# Patient Record
Sex: Male | Born: 1950 | Race: Black or African American | Hispanic: No | Marital: Single | Smoking: Never smoker
Health system: Southern US, Community
[De-identification: ages and names within clinical notes are randomized; demographics above are authoritative.]

## PROBLEM LIST (undated history)

## (undated) DIAGNOSIS — Z992 Dependence on renal dialysis: Secondary | ICD-10-CM

## (undated) DIAGNOSIS — Z789 Other specified health status: Secondary | ICD-10-CM

## (undated) DIAGNOSIS — Z5189 Encounter for other specified aftercare: Secondary | ICD-10-CM

## (undated) DIAGNOSIS — G4730 Sleep apnea, unspecified: Secondary | ICD-10-CM

## (undated) DIAGNOSIS — IMO0001 Reserved for inherently not codable concepts without codable children: Secondary | ICD-10-CM

## (undated) DIAGNOSIS — E785 Hyperlipidemia, unspecified: Secondary | ICD-10-CM

## (undated) DIAGNOSIS — J449 Chronic obstructive pulmonary disease, unspecified: Secondary | ICD-10-CM

## (undated) DIAGNOSIS — Z9119 Patient's noncompliance with other medical treatment and regimen: Secondary | ICD-10-CM

## (undated) DIAGNOSIS — C8590 Non-Hodgkin lymphoma, unspecified, unspecified site: Secondary | ICD-10-CM

## (undated) DIAGNOSIS — E1121 Type 2 diabetes mellitus with diabetic nephropathy: Secondary | ICD-10-CM

## (undated) DIAGNOSIS — N186 End stage renal disease: Secondary | ICD-10-CM

## (undated) DIAGNOSIS — I4891 Unspecified atrial fibrillation: Secondary | ICD-10-CM

## (undated) DIAGNOSIS — Z794 Long term (current) use of insulin: Secondary | ICD-10-CM

## (undated) DIAGNOSIS — C4490 Unspecified malignant neoplasm of skin, unspecified: Secondary | ICD-10-CM

## (undated) DIAGNOSIS — I472 Ventricular tachycardia: Secondary | ICD-10-CM

## (undated) DIAGNOSIS — D649 Anemia, unspecified: Secondary | ICD-10-CM

## (undated) DIAGNOSIS — I739 Peripheral vascular disease, unspecified: Secondary | ICD-10-CM

## (undated) DIAGNOSIS — I82409 Acute embolism and thrombosis of unspecified deep veins of unspecified lower extremity: Secondary | ICD-10-CM

## (undated) DIAGNOSIS — Z8489 Family history of other specified conditions: Secondary | ICD-10-CM

## (undated) DIAGNOSIS — I10 Essential (primary) hypertension: Secondary | ICD-10-CM

## (undated) DIAGNOSIS — K219 Gastro-esophageal reflux disease without esophagitis: Secondary | ICD-10-CM

## (undated) DIAGNOSIS — M1990 Unspecified osteoarthritis, unspecified site: Secondary | ICD-10-CM

## (undated) DIAGNOSIS — N184 Chronic kidney disease, stage 4 (severe): Secondary | ICD-10-CM

## (undated) DIAGNOSIS — J189 Pneumonia, unspecified organism: Secondary | ICD-10-CM

## (undated) DIAGNOSIS — C8293 Follicular lymphoma, unspecified, intra-abdominal lymph nodes: Secondary | ICD-10-CM

## (undated) DIAGNOSIS — A419 Sepsis, unspecified organism: Secondary | ICD-10-CM

## (undated) DIAGNOSIS — R51 Headache: Secondary | ICD-10-CM

## (undated) DIAGNOSIS — E119 Type 2 diabetes mellitus without complications: Secondary | ICD-10-CM

## (undated) HISTORY — PX: PORTACATH PLACEMENT: SHX2246

## (undated) HISTORY — PX: EYE SURGERY: SHX253

## (undated) HISTORY — PX: OTHER SURGICAL HISTORY: SHX169

## (undated) HISTORY — PX: PORT-A-CATH REMOVAL: SHX5289

## (undated) HISTORY — DX: Follicular lymphoma, unspecified, intra-abdominal lymph nodes: C82.93

## (undated) HISTORY — PX: CARDIAC CATHETERIZATION: SHX172

---

## 1998-05-25 ENCOUNTER — Emergency Department (HOSPITAL_COMMUNITY): Admission: EM | Admit: 1998-05-25 | Discharge: 1998-05-25 | Payer: Self-pay | Admitting: Emergency Medicine

## 2002-08-22 ENCOUNTER — Emergency Department (HOSPITAL_COMMUNITY): Admission: EM | Admit: 2002-08-22 | Discharge: 2002-08-22 | Payer: Self-pay | Admitting: Emergency Medicine

## 2002-08-22 ENCOUNTER — Encounter: Payer: Self-pay | Admitting: *Deleted

## 2004-07-01 ENCOUNTER — Emergency Department (HOSPITAL_COMMUNITY): Admission: EM | Admit: 2004-07-01 | Discharge: 2004-07-01 | Payer: Self-pay | Admitting: Emergency Medicine

## 2004-10-10 ENCOUNTER — Ambulatory Visit: Payer: Self-pay | Admitting: Internal Medicine

## 2004-11-10 ENCOUNTER — Encounter (INDEPENDENT_AMBULATORY_CARE_PROVIDER_SITE_OTHER): Payer: Self-pay | Admitting: *Deleted

## 2004-11-10 ENCOUNTER — Ambulatory Visit: Payer: Self-pay | Admitting: Internal Medicine

## 2006-08-27 ENCOUNTER — Encounter (HOSPITAL_BASED_OUTPATIENT_CLINIC_OR_DEPARTMENT_OTHER): Admission: RE | Admit: 2006-08-27 | Discharge: 2006-11-25 | Payer: Self-pay | Admitting: Surgery

## 2007-10-08 ENCOUNTER — Inpatient Hospital Stay (HOSPITAL_COMMUNITY): Admission: EM | Admit: 2007-10-08 | Discharge: 2007-10-14 | Payer: Self-pay | Admitting: Emergency Medicine

## 2007-10-12 ENCOUNTER — Encounter (INDEPENDENT_AMBULATORY_CARE_PROVIDER_SITE_OTHER): Payer: Self-pay | Admitting: Interventional Radiology

## 2007-10-20 ENCOUNTER — Encounter (HOSPITAL_COMMUNITY): Admission: RE | Admit: 2007-10-20 | Discharge: 2008-01-18 | Payer: Self-pay | Admitting: Internal Medicine

## 2007-10-27 ENCOUNTER — Observation Stay (HOSPITAL_COMMUNITY): Admission: AD | Admit: 2007-10-27 | Discharge: 2007-10-29 | Payer: Self-pay

## 2007-10-28 ENCOUNTER — Encounter (INDEPENDENT_AMBULATORY_CARE_PROVIDER_SITE_OTHER): Payer: Self-pay | Admitting: Surgery

## 2007-11-07 ENCOUNTER — Ambulatory Visit: Payer: Self-pay | Admitting: Oncology

## 2007-11-17 LAB — COMPREHENSIVE METABOLIC PANEL
ALT: 11 U/L (ref 0–53)
AST: 10 U/L (ref 0–37)
Albumin: 3.7 g/dL (ref 3.5–5.2)
Alkaline Phosphatase: 118 U/L — ABNORMAL HIGH (ref 39–117)
BUN: 24 mg/dL — ABNORMAL HIGH (ref 6–23)
CO2: 22 mEq/L (ref 19–32)
Calcium: 9.3 mg/dL (ref 8.4–10.5)
Chloride: 106 mEq/L (ref 96–112)
Creatinine, Ser: 1.79 mg/dL — ABNORMAL HIGH (ref 0.40–1.50)
Glucose, Bld: 282 mg/dL — ABNORMAL HIGH (ref 70–99)
Potassium: 4.7 mEq/L (ref 3.5–5.3)
Sodium: 139 mEq/L (ref 135–145)
Total Bilirubin: 0.2 mg/dL — ABNORMAL LOW (ref 0.3–1.2)
Total Protein: 7.1 g/dL (ref 6.0–8.3)

## 2007-11-17 LAB — PROTHROMBIN TIME
INR: 1 (ref 0.0–1.5)
Prothrombin Time: 13.8 seconds (ref 11.6–15.2)

## 2007-11-17 LAB — CBC WITH DIFFERENTIAL/PLATELET
BASO%: 0.5 % (ref 0.0–2.0)
Basophils Absolute: 0 10*3/uL (ref 0.0–0.1)
EOS%: 2.9 % (ref 0.0–7.0)
Eosinophils Absolute: 0.2 10*3/uL (ref 0.0–0.5)
HCT: 29.3 % — ABNORMAL LOW (ref 38.7–49.9)
HGB: 9.7 g/dL — ABNORMAL LOW (ref 13.0–17.1)
LYMPH%: 21.8 % (ref 14.0–48.0)
MCH: 25.5 pg — ABNORMAL LOW (ref 28.0–33.4)
MCHC: 33.2 g/dL (ref 32.0–35.9)
MCV: 76.7 fL — ABNORMAL LOW (ref 81.6–98.0)
MONO#: 0.4 10*3/uL (ref 0.1–0.9)
MONO%: 5.6 % (ref 0.0–13.0)
NEUT#: 4.7 10*3/uL (ref 1.5–6.5)
NEUT%: 69.2 % (ref 40.0–75.0)
Platelets: 385 10*3/uL (ref 145–400)
RBC: 3.82 10*6/uL — ABNORMAL LOW (ref 4.20–5.71)
RDW: 15.8 % — ABNORMAL HIGH (ref 11.2–14.6)
WBC: 6.7 10*3/uL (ref 4.0–10.0)
lymph#: 1.5 10*3/uL (ref 0.9–3.3)

## 2007-11-17 LAB — LACTATE DEHYDROGENASE: LDH: 220 U/L (ref 94–250)

## 2007-11-17 LAB — URIC ACID: Uric Acid, Serum: 7 mg/dL (ref 4.0–7.8)

## 2007-11-17 LAB — APTT: aPTT: 36 seconds (ref 24–37)

## 2007-11-21 ENCOUNTER — Ambulatory Visit (HOSPITAL_COMMUNITY): Admission: RE | Admit: 2007-11-21 | Discharge: 2007-11-21 | Payer: Self-pay | Admitting: Oncology

## 2007-11-23 ENCOUNTER — Ambulatory Visit (HOSPITAL_COMMUNITY): Admission: RE | Admit: 2007-11-23 | Discharge: 2007-11-23 | Payer: Self-pay | Admitting: Oncology

## 2007-11-24 ENCOUNTER — Ambulatory Visit (HOSPITAL_BASED_OUTPATIENT_CLINIC_OR_DEPARTMENT_OTHER): Admission: RE | Admit: 2007-11-24 | Discharge: 2007-11-24 | Payer: Self-pay | Admitting: Surgery

## 2007-11-28 ENCOUNTER — Encounter: Payer: Self-pay | Admitting: Oncology

## 2007-11-28 ENCOUNTER — Ambulatory Visit (HOSPITAL_COMMUNITY): Admission: RE | Admit: 2007-11-28 | Discharge: 2007-11-28 | Payer: Self-pay | Admitting: Oncology

## 2007-11-28 ENCOUNTER — Ambulatory Visit: Payer: Self-pay | Admitting: Oncology

## 2007-12-01 LAB — COMPREHENSIVE METABOLIC PANEL
ALT: 24 U/L (ref 0–53)
AST: 23 U/L (ref 0–37)
Albumin: 3.8 g/dL (ref 3.5–5.2)
Alkaline Phosphatase: 116 U/L (ref 39–117)
BUN: 27 mg/dL — ABNORMAL HIGH (ref 6–23)
CO2: 24 mEq/L (ref 19–32)
Calcium: 8.7 mg/dL (ref 8.4–10.5)
Chloride: 106 mEq/L (ref 96–112)
Creatinine, Ser: 1.65 mg/dL — ABNORMAL HIGH (ref 0.40–1.50)
Glucose, Bld: 97 mg/dL (ref 70–99)
Potassium: 4.7 mEq/L (ref 3.5–5.3)
Sodium: 140 mEq/L (ref 135–145)
Total Bilirubin: 0.3 mg/dL (ref 0.3–1.2)
Total Protein: 7.1 g/dL (ref 6.0–8.3)

## 2007-12-01 LAB — IRON AND TIBC
Iron: <10 ug/dL — ABNORMAL LOW (ref 42–165)
UIBC: 284 ug/dL

## 2007-12-01 LAB — CBC WITH DIFFERENTIAL/PLATELET
BASO%: 2 % (ref 0.0–2.0)
Basophils Absolute: 0.2 10*3/uL — ABNORMAL HIGH (ref 0.0–0.1)
EOS%: 1.6 % (ref 0.0–7.0)
Eosinophils Absolute: 0.1 10*3/uL (ref 0.0–0.5)
HCT: 25.8 % — ABNORMAL LOW (ref 38.7–49.9)
HGB: 8.5 g/dL — ABNORMAL LOW (ref 13.0–17.1)
LYMPH%: 15.2 % (ref 14.0–48.0)
MCH: 25.1 pg — ABNORMAL LOW (ref 28.0–33.4)
MCHC: 33.1 g/dL (ref 32.0–35.9)
MCV: 75.9 fL — ABNORMAL LOW (ref 81.6–98.0)
MONO#: 0.4 10*3/uL (ref 0.1–0.9)
MONO%: 5.1 % (ref 0.0–13.0)
NEUT#: 6.2 10*3/uL (ref 1.5–6.5)
NEUT%: 76.1 % — ABNORMAL HIGH (ref 40.0–75.0)
Platelets: 291 10*3/uL (ref 145–400)
RBC: 3.4 10*6/uL — ABNORMAL LOW (ref 4.20–5.71)
RDW: 17.1 % — ABNORMAL HIGH (ref 11.2–14.6)
WBC: 8.1 10*3/uL (ref 4.0–10.0)
lymph#: 1.2 10*3/uL (ref 0.9–3.3)

## 2007-12-01 LAB — LACTATE DEHYDROGENASE: LDH: 456 U/L — ABNORMAL HIGH (ref 94–250)

## 2007-12-01 LAB — FERRITIN: Ferritin: 96 ng/mL (ref 22–322)

## 2007-12-05 ENCOUNTER — Ambulatory Visit: Payer: Self-pay | Admitting: Oncology

## 2007-12-14 ENCOUNTER — Ambulatory Visit: Payer: Self-pay | Admitting: Oncology

## 2007-12-19 LAB — CBC WITH DIFFERENTIAL/PLATELET
BASO%: 0 % (ref 0.0–2.0)
Basophils Absolute: 0 10*3/uL (ref 0.0–0.1)
EOS%: 2.7 % (ref 0.0–7.0)
Eosinophils Absolute: 0.1 10*3/uL (ref 0.0–0.5)
HCT: 26.1 % — ABNORMAL LOW (ref 38.7–49.9)
HGB: 8.7 g/dL — ABNORMAL LOW (ref 13.0–17.1)
LYMPH%: 30.6 % (ref 14.0–48.0)
MCH: 25.2 pg — ABNORMAL LOW (ref 28.0–33.4)
MCHC: 33.3 g/dL (ref 32.0–35.9)
MCV: 75.9 fL — ABNORMAL LOW (ref 81.6–98.0)
MONO#: 0.4 10*3/uL (ref 0.1–0.9)
MONO%: 13.4 % — ABNORMAL HIGH (ref 0.0–13.0)
NEUT#: 1.4 10*3/uL — ABNORMAL LOW (ref 1.5–6.5)
NEUT%: 53.3 % (ref 40.0–75.0)
Platelets: 323 10*3/uL (ref 145–400)
RBC: 3.44 10*6/uL — ABNORMAL LOW (ref 4.20–5.71)
RDW: 17.8 % — ABNORMAL HIGH (ref 11.2–14.6)
WBC: 2.7 10*3/uL — ABNORMAL LOW (ref 4.0–10.0)
lymph#: 0.8 10*3/uL — ABNORMAL LOW (ref 0.9–3.3)

## 2007-12-22 LAB — COMPREHENSIVE METABOLIC PANEL
ALT: 20 U/L (ref 0–53)
AST: 16 U/L (ref 0–37)
Albumin: 3.8 g/dL (ref 3.5–5.2)
Alkaline Phosphatase: 95 U/L (ref 39–117)
BUN: 25 mg/dL — ABNORMAL HIGH (ref 6–23)
CO2: 23 mEq/L (ref 19–32)
Calcium: 9.3 mg/dL (ref 8.4–10.5)
Chloride: 102 mEq/L (ref 96–112)
Creatinine, Ser: 1.63 mg/dL — ABNORMAL HIGH (ref 0.40–1.50)
Glucose, Bld: 227 mg/dL — ABNORMAL HIGH (ref 70–99)
Potassium: 4.6 mEq/L (ref 3.5–5.3)
Sodium: 135 mEq/L (ref 135–145)
Total Bilirubin: 0.2 mg/dL — ABNORMAL LOW (ref 0.3–1.2)
Total Protein: 6.3 g/dL (ref 6.0–8.3)

## 2007-12-22 LAB — CBC WITH DIFFERENTIAL/PLATELET
BASO%: 4.8 % — ABNORMAL HIGH (ref 0.0–2.0)
Basophils Absolute: 0.1 10*3/uL (ref 0.0–0.1)
EOS%: 2.1 % (ref 0.0–7.0)
Eosinophils Absolute: 0.1 10*3/uL (ref 0.0–0.5)
HCT: 27.4 % — ABNORMAL LOW (ref 38.7–49.9)
HGB: 8.7 g/dL — ABNORMAL LOW (ref 13.0–17.1)
LYMPH%: 37.2 % (ref 14.0–48.0)
MCH: 24.7 pg — ABNORMAL LOW (ref 28.0–33.4)
MCHC: 31.8 g/dL — ABNORMAL LOW (ref 32.0–35.9)
MCV: 77.7 fL — ABNORMAL LOW (ref 81.6–98.0)
MONO#: 0.6 10*3/uL (ref 0.1–0.9)
MONO%: 19.3 % — ABNORMAL HIGH (ref 0.0–13.0)
NEUT#: 1.1 10*3/uL — ABNORMAL LOW (ref 1.5–6.5)
NEUT%: 36.7 % — ABNORMAL LOW (ref 40.0–75.0)
Platelets: 348 10*3/uL (ref 145–400)
RBC: 3.52 10*6/uL — ABNORMAL LOW (ref 4.20–5.71)
RDW: 18.2 % — ABNORMAL HIGH (ref 11.2–14.6)
WBC: 3 10*3/uL — ABNORMAL LOW (ref 4.0–10.0)
lymph#: 1.1 10*3/uL (ref 0.9–3.3)

## 2007-12-22 LAB — TECHNOLOGIST REVIEW

## 2007-12-22 LAB — LACTATE DEHYDROGENASE: LDH: 197 U/L (ref 94–250)

## 2008-01-13 LAB — CBC WITH DIFFERENTIAL/PLATELET
BASO%: 0.3 % (ref 0.0–2.0)
Basophils Absolute: 0 10*3/uL (ref 0.0–0.1)
EOS%: 0.6 % (ref 0.0–7.0)
Eosinophils Absolute: 0 10*3/uL (ref 0.0–0.5)
HCT: 25.2 % — ABNORMAL LOW (ref 38.7–49.9)
HGB: 8.4 g/dL — ABNORMAL LOW (ref 13.0–17.1)
LYMPH%: 13.7 % — ABNORMAL LOW (ref 14.0–48.0)
MCH: 26.2 pg — ABNORMAL LOW (ref 28.0–33.4)
MCHC: 33.2 g/dL (ref 32.0–35.9)
MCV: 78.9 fL — ABNORMAL LOW (ref 81.6–98.0)
MONO#: 0.5 10*3/uL (ref 0.1–0.9)
MONO%: 7.2 % (ref 0.0–13.0)
NEUT#: 5.9 10*3/uL (ref 1.5–6.5)
NEUT%: 78.2 % — ABNORMAL HIGH (ref 40.0–75.0)
Platelets: 310 10*3/uL (ref 145–400)
RBC: 3.19 10*6/uL — ABNORMAL LOW (ref 4.20–5.71)
RDW: 22.6 % — ABNORMAL HIGH (ref 11.2–14.6)
WBC: 7.5 10*3/uL (ref 4.0–10.0)
lymph#: 1 10*3/uL (ref 0.9–3.3)

## 2008-01-13 LAB — COMPREHENSIVE METABOLIC PANEL
ALT: 25 U/L (ref 0–53)
AST: 21 U/L (ref 0–37)
Albumin: 4 g/dL (ref 3.5–5.2)
Alkaline Phosphatase: 112 U/L (ref 39–117)
BUN: 25 mg/dL — ABNORMAL HIGH (ref 6–23)
CO2: 24 mEq/L (ref 19–32)
Calcium: 9 mg/dL (ref 8.4–10.5)
Chloride: 108 mEq/L (ref 96–112)
Creatinine, Ser: 1.81 mg/dL — ABNORMAL HIGH (ref 0.40–1.50)
Glucose, Bld: 165 mg/dL — ABNORMAL HIGH (ref 70–99)
Potassium: 4.4 mEq/L (ref 3.5–5.3)
Sodium: 143 mEq/L (ref 135–145)
Total Bilirubin: 0.2 mg/dL — ABNORMAL LOW (ref 0.3–1.2)
Total Protein: 6.6 g/dL (ref 6.0–8.3)

## 2008-01-13 LAB — LACTATE DEHYDROGENASE: LDH: 272 U/L — ABNORMAL HIGH (ref 94–250)

## 2008-01-16 LAB — CBC WITH DIFFERENTIAL/PLATELET
BASO%: 0.1 % (ref 0.0–2.0)
Basophils Absolute: 0 10*3/uL (ref 0.0–0.1)
EOS%: 0.8 % (ref 0.0–7.0)
Eosinophils Absolute: 0.1 10*3/uL (ref 0.0–0.5)
HCT: 25.8 % — ABNORMAL LOW (ref 38.7–49.9)
HGB: 8.5 g/dL — ABNORMAL LOW (ref 13.0–17.1)
LYMPH%: 8.8 % — ABNORMAL LOW (ref 14.0–48.0)
MCH: 26.1 pg — ABNORMAL LOW (ref 28.0–33.4)
MCHC: 32.8 g/dL (ref 32.0–35.9)
MCV: 79.5 fL — ABNORMAL LOW (ref 81.6–98.0)
MONO#: 0.4 10*3/uL (ref 0.1–0.9)
MONO%: 5.9 % (ref 0.0–13.0)
NEUT#: 6.2 10*3/uL (ref 1.5–6.5)
NEUT%: 84.4 % — ABNORMAL HIGH (ref 40.0–75.0)
Platelets: 360 10*3/uL (ref 145–400)
RBC: 3.25 10*6/uL — ABNORMAL LOW (ref 4.20–5.71)
RDW: 23.9 % — ABNORMAL HIGH (ref 11.2–14.6)
WBC: 7.4 10*3/uL (ref 4.0–10.0)
lymph#: 0.6 10*3/uL — ABNORMAL LOW (ref 0.9–3.3)

## 2008-01-18 ENCOUNTER — Encounter (HOSPITAL_COMMUNITY): Admission: RE | Admit: 2008-01-18 | Discharge: 2008-02-09 | Payer: Self-pay | Admitting: Oncology

## 2008-01-18 LAB — TYPE & CROSSMATCH - CHCC

## 2008-01-20 LAB — COMPREHENSIVE METABOLIC PANEL
ALT: 39 U/L (ref 0–53)
AST: 30 U/L (ref 0–37)
Albumin: 3.6 g/dL (ref 3.5–5.2)
Alkaline Phosphatase: 125 U/L — ABNORMAL HIGH (ref 39–117)
BUN: 24 mg/dL — ABNORMAL HIGH (ref 6–23)
CO2: 25 mEq/L (ref 19–32)
Calcium: 9.4 mg/dL (ref 8.4–10.5)
Chloride: 103 mEq/L (ref 96–112)
Creatinine, Ser: 1.46 mg/dL (ref 0.40–1.50)
Glucose, Bld: 315 mg/dL — ABNORMAL HIGH (ref 70–99)
Potassium: 4 mEq/L (ref 3.5–5.3)
Sodium: 136 mEq/L (ref 135–145)
Total Bilirubin: 0.9 mg/dL (ref 0.3–1.2)
Total Protein: 6.6 g/dL (ref 6.0–8.3)

## 2008-01-20 LAB — CBC WITH DIFFERENTIAL/PLATELET
BASO%: 0 % (ref 0.0–2.0)
Basophils Absolute: 0 10*3/uL (ref 0.0–0.1)
EOS%: 0 % (ref 0.0–7.0)
Eosinophils Absolute: 0 10*3/uL (ref 0.0–0.5)
HCT: 30.9 % — ABNORMAL LOW (ref 38.7–49.9)
HGB: 10.3 g/dL — ABNORMAL LOW (ref 13.0–17.1)
LYMPH%: 2.7 % — ABNORMAL LOW (ref 14.0–48.0)
MCH: 27.1 pg — ABNORMAL LOW (ref 28.0–33.4)
MCHC: 33.4 g/dL (ref 32.0–35.9)
MCV: 81 fL — ABNORMAL LOW (ref 81.6–98.0)
MONO#: 0.1 10*3/uL (ref 0.1–0.9)
MONO%: 0.4 % (ref 0.0–13.0)
NEUT#: 36 10*3/uL — ABNORMAL HIGH (ref 1.5–6.5)
NEUT%: 96.9 % — ABNORMAL HIGH (ref 40.0–75.0)
Platelets: 319 10*3/uL (ref 145–400)
RBC: 3.81 10*6/uL — ABNORMAL LOW (ref 4.20–5.71)
RDW: 22.4 % — ABNORMAL HIGH (ref 11.2–14.6)
WBC: 37.2 10*3/uL — ABNORMAL HIGH (ref 4.0–10.0)
lymph#: 1 10*3/uL (ref 0.9–3.3)

## 2008-01-20 LAB — LACTATE DEHYDROGENASE: LDH: 264 U/L — ABNORMAL HIGH (ref 94–250)

## 2008-01-27 LAB — CBC WITH DIFFERENTIAL/PLATELET
BASO%: 0.1 % (ref 0.0–2.0)
Basophils Absolute: 0 10*3/uL (ref 0.0–0.1)
EOS%: 0.7 % (ref 0.0–7.0)
Eosinophils Absolute: 0.1 10*3/uL (ref 0.0–0.5)
HCT: 28.3 % — ABNORMAL LOW (ref 38.7–49.9)
HGB: 9.4 g/dL — ABNORMAL LOW (ref 13.0–17.1)
LYMPH%: 9.8 % — ABNORMAL LOW (ref 14.0–48.0)
MCH: 26.9 pg — ABNORMAL LOW (ref 28.0–33.4)
MCHC: 33.1 g/dL (ref 32.0–35.9)
MCV: 81.2 fL — ABNORMAL LOW (ref 81.6–98.0)
MONO#: 0.5 10*3/uL (ref 0.1–0.9)
MONO%: 5.8 % (ref 0.0–13.0)
NEUT#: 7.6 10*3/uL — ABNORMAL HIGH (ref 1.5–6.5)
NEUT%: 83.6 % — ABNORMAL HIGH (ref 40.0–75.0)
Platelets: 176 10*3/uL (ref 145–400)
RBC: 3.48 10*6/uL — ABNORMAL LOW (ref 4.20–5.71)
RDW: 22.8 % — ABNORMAL HIGH (ref 11.2–14.6)
WBC: 9.1 10*3/uL (ref 4.0–10.0)
lymph#: 0.9 10*3/uL (ref 0.9–3.3)

## 2008-01-27 LAB — COMPREHENSIVE METABOLIC PANEL
ALT: 16 U/L (ref 0–53)
AST: 12 U/L (ref 0–37)
Albumin: 3.8 g/dL (ref 3.5–5.2)
Alkaline Phosphatase: 141 U/L — ABNORMAL HIGH (ref 39–117)
BUN: 22 mg/dL (ref 6–23)
CO2: 22 mEq/L (ref 19–32)
Calcium: 8.9 mg/dL (ref 8.4–10.5)
Chloride: 107 mEq/L (ref 96–112)
Creatinine, Ser: 1.63 mg/dL — ABNORMAL HIGH (ref 0.40–1.50)
Glucose, Bld: 306 mg/dL — ABNORMAL HIGH (ref 70–99)
Potassium: 3.9 mEq/L (ref 3.5–5.3)
Sodium: 140 mEq/L (ref 135–145)
Total Bilirubin: 0.2 mg/dL — ABNORMAL LOW (ref 0.3–1.2)
Total Protein: 6.6 g/dL (ref 6.0–8.3)

## 2008-01-27 LAB — LACTATE DEHYDROGENASE: LDH: 230 U/L (ref 94–250)

## 2008-01-30 ENCOUNTER — Ambulatory Visit (HOSPITAL_COMMUNITY): Admission: RE | Admit: 2008-01-30 | Discharge: 2008-01-30 | Payer: Self-pay | Admitting: Oncology

## 2008-02-01 ENCOUNTER — Ambulatory Visit: Payer: Self-pay | Admitting: Oncology

## 2008-02-03 LAB — CBC WITH DIFFERENTIAL/PLATELET
BASO%: 0.1 % (ref 0.0–2.0)
Basophils Absolute: 0 10*3/uL (ref 0.0–0.1)
EOS%: 1 % (ref 0.0–7.0)
Eosinophils Absolute: 0.1 10*3/uL (ref 0.0–0.5)
HCT: 30.8 % — ABNORMAL LOW (ref 38.7–49.9)
HGB: 10.5 g/dL — ABNORMAL LOW (ref 13.0–17.1)
LYMPH%: 13.2 % — ABNORMAL LOW (ref 14.0–48.0)
MCH: 27.7 pg — ABNORMAL LOW (ref 28.0–33.4)
MCHC: 34 g/dL (ref 32.0–35.9)
MCV: 81.4 fL — ABNORMAL LOW (ref 81.6–98.0)
MONO#: 0.8 10*3/uL (ref 0.1–0.9)
MONO%: 8.8 % (ref 0.0–13.0)
NEUT#: 6.7 10*3/uL — ABNORMAL HIGH (ref 1.5–6.5)
NEUT%: 76.9 % — ABNORMAL HIGH (ref 40.0–75.0)
Platelets: 282 10*3/uL (ref 145–400)
RBC: 3.79 10*6/uL — ABNORMAL LOW (ref 4.20–5.71)
RDW: 23 % — ABNORMAL HIGH (ref 11.2–14.6)
WBC: 8.6 10*3/uL (ref 4.0–10.0)
lymph#: 1.1 10*3/uL (ref 0.9–3.3)

## 2008-02-03 LAB — COMPREHENSIVE METABOLIC PANEL
ALT: 23 U/L (ref 0–53)
AST: 18 U/L (ref 0–37)
Albumin: 4.1 g/dL (ref 3.5–5.2)
Alkaline Phosphatase: 120 U/L — ABNORMAL HIGH (ref 39–117)
BUN: 32 mg/dL — ABNORMAL HIGH (ref 6–23)
CO2: 26 mEq/L (ref 19–32)
Calcium: 9.9 mg/dL (ref 8.4–10.5)
Chloride: 106 mEq/L (ref 96–112)
Creatinine, Ser: 1.96 mg/dL — ABNORMAL HIGH (ref 0.40–1.50)
Glucose, Bld: 111 mg/dL — ABNORMAL HIGH (ref 70–99)
Potassium: 4.7 mEq/L (ref 3.5–5.3)
Sodium: 141 mEq/L (ref 135–145)
Total Bilirubin: 0.3 mg/dL (ref 0.3–1.2)
Total Protein: 6.9 g/dL (ref 6.0–8.3)

## 2008-02-03 LAB — LACTATE DEHYDROGENASE: LDH: 259 U/L — ABNORMAL HIGH (ref 94–250)

## 2008-02-10 ENCOUNTER — Inpatient Hospital Stay (HOSPITAL_COMMUNITY): Admission: AD | Admit: 2008-02-10 | Discharge: 2008-02-13 | Payer: Self-pay | Admitting: Oncology

## 2008-02-10 LAB — COMPREHENSIVE METABOLIC PANEL
ALT: 30 U/L (ref 0–53)
AST: 25 U/L (ref 0–37)
Albumin: 4.1 g/dL (ref 3.5–5.2)
Alkaline Phosphatase: 138 U/L — ABNORMAL HIGH (ref 39–117)
BUN: 42 mg/dL — ABNORMAL HIGH (ref 6–23)
CO2: 23 mEq/L (ref 19–32)
Calcium: 9.9 mg/dL (ref 8.4–10.5)
Chloride: 100 mEq/L (ref 96–112)
Creatinine, Ser: 1.93 mg/dL — ABNORMAL HIGH (ref 0.40–1.50)
Glucose, Bld: 413 mg/dL — ABNORMAL HIGH (ref 70–99)
Potassium: 5.2 mEq/L (ref 3.5–5.3)
Sodium: 134 mEq/L — ABNORMAL LOW (ref 135–145)
Total Bilirubin: 0.9 mg/dL (ref 0.3–1.2)
Total Protein: 7.1 g/dL (ref 6.0–8.3)

## 2008-02-10 LAB — CBC WITH DIFFERENTIAL/PLATELET
BASO%: 0 % (ref 0.0–2.0)
Basophils Absolute: 0 10*3/uL (ref 0.0–0.1)
EOS%: 0 % (ref 0.0–7.0)
Eosinophils Absolute: 0 10*3/uL (ref 0.0–0.5)
HCT: 32 % — ABNORMAL LOW (ref 38.7–49.9)
HGB: 10.6 g/dL — ABNORMAL LOW (ref 13.0–17.1)
LYMPH%: 1.9 % — ABNORMAL LOW (ref 14.0–48.0)
MCH: 27.3 pg — ABNORMAL LOW (ref 28.0–33.4)
MCHC: 33.3 g/dL (ref 32.0–35.9)
MCV: 82.1 fL (ref 81.6–98.0)
MONO#: 0.3 10*3/uL (ref 0.1–0.9)
MONO%: 0.7 % (ref 0.0–13.0)
NEUT#: 47.7 10*3/uL — ABNORMAL HIGH (ref 1.5–6.5)
NEUT%: 97.4 % — ABNORMAL HIGH (ref 40.0–75.0)
Platelets: 271 10*3/uL (ref 145–400)
RBC: 3.89 10*6/uL — ABNORMAL LOW (ref 4.20–5.71)
RDW: 23.1 % — ABNORMAL HIGH (ref 11.2–14.6)
WBC: 49 10*3/uL — ABNORMAL HIGH (ref 4.0–10.0)
lymph#: 0.9 10*3/uL (ref 0.9–3.3)

## 2008-02-10 LAB — LACTATE DEHYDROGENASE: LDH: 279 U/L — ABNORMAL HIGH (ref 94–250)

## 2008-02-11 ENCOUNTER — Ambulatory Visit: Payer: Self-pay | Admitting: Oncology

## 2008-02-17 LAB — COMPREHENSIVE METABOLIC PANEL
ALT: 20 U/L (ref 0–53)
AST: 13 U/L (ref 0–37)
Albumin: 3.8 g/dL (ref 3.5–5.2)
Alkaline Phosphatase: 103 U/L (ref 39–117)
BUN: 21 mg/dL (ref 6–23)
CO2: 23 mEq/L (ref 19–32)
Calcium: 9 mg/dL (ref 8.4–10.5)
Chloride: 104 mEq/L (ref 96–112)
Creatinine, Ser: 1.51 mg/dL — ABNORMAL HIGH (ref 0.40–1.50)
Glucose, Bld: 201 mg/dL — ABNORMAL HIGH (ref 70–99)
Potassium: 4.2 mEq/L (ref 3.5–5.3)
Sodium: 137 mEq/L (ref 135–145)
Total Bilirubin: 0.2 mg/dL — ABNORMAL LOW (ref 0.3–1.2)
Total Protein: 6.5 g/dL (ref 6.0–8.3)

## 2008-02-17 LAB — CBC WITH DIFFERENTIAL/PLATELET
BASO%: 0 % (ref 0.0–2.0)
Basophils Absolute: 0 10*3/uL (ref 0.0–0.1)
EOS%: 0.6 % (ref 0.0–7.0)
Eosinophils Absolute: 0 10*3/uL (ref 0.0–0.5)
HCT: 27.5 % — ABNORMAL LOW (ref 38.7–49.9)
HGB: 9.2 g/dL — ABNORMAL LOW (ref 13.0–17.1)
LYMPH%: 11.1 % — ABNORMAL LOW (ref 14.0–48.0)
MCH: 27.5 pg — ABNORMAL LOW (ref 28.0–33.4)
MCHC: 33.6 g/dL (ref 32.0–35.9)
MCV: 82 fL (ref 81.6–98.0)
MONO#: 0.2 10*3/uL (ref 0.1–0.9)
MONO%: 4.1 % (ref 0.0–13.0)
NEUT#: 5 10*3/uL (ref 1.5–6.5)
NEUT%: 84.2 % — ABNORMAL HIGH (ref 40.0–75.0)
Platelets: 182 10*3/uL (ref 145–400)
RBC: 3.36 10*6/uL — ABNORMAL LOW (ref 4.20–5.71)
RDW: 22.5 % — ABNORMAL HIGH (ref 11.2–14.6)
WBC: 6 10*3/uL (ref 4.0–10.0)
lymph#: 0.7 10*3/uL — ABNORMAL LOW (ref 0.9–3.3)

## 2008-02-17 LAB — LACTATE DEHYDROGENASE: LDH: 191 U/L (ref 94–250)

## 2008-02-24 ENCOUNTER — Ambulatory Visit: Admission: RE | Admit: 2008-02-24 | Discharge: 2008-02-24 | Payer: Self-pay | Admitting: Oncology

## 2008-02-24 ENCOUNTER — Encounter: Payer: Self-pay | Admitting: Oncology

## 2008-02-24 ENCOUNTER — Ambulatory Visit: Payer: Self-pay | Admitting: Surgery

## 2008-02-24 LAB — CBC WITH DIFFERENTIAL/PLATELET
BASO%: 0.1 % (ref 0.0–2.0)
Basophils Absolute: 0 10*3/uL (ref 0.0–0.1)
EOS%: 0.3 % (ref 0.0–7.0)
Eosinophils Absolute: 0 10*3/uL (ref 0.0–0.5)
HCT: 28 % — ABNORMAL LOW (ref 38.7–49.9)
HGB: 9.3 g/dL — ABNORMAL LOW (ref 13.0–17.1)
LYMPH%: 7.9 % — ABNORMAL LOW (ref 14.0–48.0)
MCH: 27.4 pg — ABNORMAL LOW (ref 28.0–33.4)
MCHC: 33.4 g/dL (ref 32.0–35.9)
MCV: 82.1 fL (ref 81.6–98.0)
MONO#: 0.5 10*3/uL (ref 0.1–0.9)
MONO%: 6.3 % (ref 0.0–13.0)
NEUT#: 7.2 10*3/uL — ABNORMAL HIGH (ref 1.5–6.5)
NEUT%: 85.4 % — ABNORMAL HIGH (ref 40.0–75.0)
Platelets: 226 10*3/uL (ref 145–400)
RBC: 3.4 10*6/uL — ABNORMAL LOW (ref 4.20–5.71)
RDW: 22.8 % — ABNORMAL HIGH (ref 11.2–14.6)
WBC: 8.5 10*3/uL (ref 4.0–10.0)
lymph#: 0.7 10*3/uL — ABNORMAL LOW (ref 0.9–3.3)

## 2008-02-24 LAB — COMPREHENSIVE METABOLIC PANEL
ALT: 19 U/L (ref 0–53)
AST: 17 U/L (ref 0–37)
Albumin: 4 g/dL (ref 3.5–5.2)
Alkaline Phosphatase: 106 U/L (ref 39–117)
BUN: 17 mg/dL (ref 6–23)
CO2: 22 mEq/L (ref 19–32)
Calcium: 9.4 mg/dL (ref 8.4–10.5)
Chloride: 105 mEq/L (ref 96–112)
Creatinine, Ser: 1.4 mg/dL (ref 0.40–1.50)
Glucose, Bld: 132 mg/dL — ABNORMAL HIGH (ref 70–99)
Potassium: 4.1 mEq/L (ref 3.5–5.3)
Sodium: 139 mEq/L (ref 135–145)
Total Bilirubin: 0.2 mg/dL — ABNORMAL LOW (ref 0.3–1.2)
Total Protein: 6.6 g/dL (ref 6.0–8.3)

## 2008-02-24 LAB — LACTATE DEHYDROGENASE: LDH: 238 U/L (ref 94–250)

## 2008-03-02 LAB — CBC WITH DIFFERENTIAL/PLATELET
BASO%: 0.1 % (ref 0.0–2.0)
Basophils Absolute: 0 10*3/uL (ref 0.0–0.1)
EOS%: 0.2 % (ref 0.0–7.0)
Eosinophils Absolute: 0 10*3/uL (ref 0.0–0.5)
HCT: 26.9 % — ABNORMAL LOW (ref 38.7–49.9)
HGB: 9 g/dL — ABNORMAL LOW (ref 13.0–17.1)
LYMPH%: 6.7 % — ABNORMAL LOW (ref 14.0–48.0)
MCH: 27.7 pg — ABNORMAL LOW (ref 28.0–33.4)
MCHC: 33.5 g/dL (ref 32.0–35.9)
MCV: 82.6 fL (ref 81.6–98.0)
MONO#: 0.1 10*3/uL (ref 0.1–0.9)
MONO%: 1.2 % (ref 0.0–13.0)
NEUT#: 4.9 10*3/uL (ref 1.5–6.5)
NEUT%: 91.8 % — ABNORMAL HIGH (ref 40.0–75.0)
Platelets: 257 10*3/uL (ref 145–400)
RBC: 3.26 10*6/uL — ABNORMAL LOW (ref 4.20–5.71)
RDW: 21.7 % — ABNORMAL HIGH (ref 11.2–14.6)
WBC: 5.3 10*3/uL (ref 4.0–10.0)
lymph#: 0.4 10*3/uL — ABNORMAL LOW (ref 0.9–3.3)

## 2008-03-02 LAB — COMPREHENSIVE METABOLIC PANEL
ALT: 22 U/L (ref 0–53)
AST: 15 U/L (ref 0–37)
Albumin: 4 g/dL (ref 3.5–5.2)
Alkaline Phosphatase: 91 U/L (ref 39–117)
BUN: 37 mg/dL — ABNORMAL HIGH (ref 6–23)
CO2: 22 mEq/L (ref 19–32)
Calcium: 10 mg/dL (ref 8.4–10.5)
Chloride: 107 mEq/L (ref 96–112)
Creatinine, Ser: 1.75 mg/dL — ABNORMAL HIGH (ref 0.40–1.50)
Glucose, Bld: 100 mg/dL — ABNORMAL HIGH (ref 70–99)
Potassium: 4 mEq/L (ref 3.5–5.3)
Sodium: 139 mEq/L (ref 135–145)
Total Bilirubin: 0.3 mg/dL (ref 0.3–1.2)
Total Protein: 6.7 g/dL (ref 6.0–8.3)

## 2008-03-02 LAB — LACTATE DEHYDROGENASE: LDH: 184 U/L (ref 94–250)

## 2008-03-09 LAB — CBC WITH DIFFERENTIAL/PLATELET
BASO%: 0.2 % (ref 0.0–2.0)
Basophils Absolute: 0 10*3/uL (ref 0.0–0.1)
EOS%: 1.2 % (ref 0.0–7.0)
Eosinophils Absolute: 0 10*3/uL (ref 0.0–0.5)
HCT: 26 % — ABNORMAL LOW (ref 38.7–49.9)
HGB: 8.7 g/dL — ABNORMAL LOW (ref 13.0–17.1)
LYMPH%: 16.7 % (ref 14.0–48.0)
MCH: 27.8 pg — ABNORMAL LOW (ref 28.0–33.4)
MCHC: 33.5 g/dL (ref 32.0–35.9)
MCV: 82.9 fL (ref 81.6–98.0)
MONO#: 0.2 10*3/uL (ref 0.1–0.9)
MONO%: 10.7 % (ref 0.0–13.0)
NEUT#: 1.6 10*3/uL (ref 1.5–6.5)
NEUT%: 71.2 % (ref 40.0–75.0)
Platelets: 221 10*3/uL (ref 145–400)
RBC: 3.13 10*6/uL — ABNORMAL LOW (ref 4.20–5.71)
RDW: 21 % — ABNORMAL HIGH (ref 11.2–14.6)
WBC: 2.2 10*3/uL — ABNORMAL LOW (ref 4.0–10.0)
lymph#: 0.4 10*3/uL — ABNORMAL LOW (ref 0.9–3.3)

## 2008-03-09 LAB — COMPREHENSIVE METABOLIC PANEL
ALT: 16 U/L (ref 0–53)
AST: 20 U/L (ref 0–37)
Albumin: 3.9 g/dL (ref 3.5–5.2)
Alkaline Phosphatase: 89 U/L (ref 39–117)
BUN: 25 mg/dL — ABNORMAL HIGH (ref 6–23)
CO2: 22 mEq/L (ref 19–32)
Calcium: 9.2 mg/dL (ref 8.4–10.5)
Chloride: 104 mEq/L (ref 96–112)
Creatinine, Ser: 2.15 mg/dL — ABNORMAL HIGH (ref 0.40–1.50)
Glucose, Bld: 119 mg/dL — ABNORMAL HIGH (ref 70–99)
Potassium: 5 mEq/L (ref 3.5–5.3)
Sodium: 139 mEq/L (ref 135–145)
Total Bilirubin: 0.3 mg/dL (ref 0.3–1.2)
Total Protein: 6.5 g/dL (ref 6.0–8.3)

## 2008-03-09 LAB — LACTATE DEHYDROGENASE: LDH: 209 U/L (ref 94–250)

## 2008-03-16 ENCOUNTER — Ambulatory Visit: Payer: Self-pay | Admitting: Oncology

## 2008-03-16 LAB — COMPREHENSIVE METABOLIC PANEL WITH GFR
ALT: 14 U/L (ref 0–53)
AST: 13 U/L (ref 0–37)
Albumin: 4.2 g/dL (ref 3.5–5.2)
Alkaline Phosphatase: 95 U/L (ref 39–117)
BUN: 26 mg/dL — ABNORMAL HIGH (ref 6–23)
CO2: 24 meq/L (ref 19–32)
Calcium: 9.9 mg/dL (ref 8.4–10.5)
Chloride: 108 meq/L (ref 96–112)
Creatinine, Ser: 1.69 mg/dL — ABNORMAL HIGH (ref 0.40–1.50)
Glucose, Bld: 59 mg/dL — ABNORMAL LOW (ref 70–99)
Potassium: 4 meq/L (ref 3.5–5.3)
Sodium: 143 meq/L (ref 135–145)
Total Bilirubin: 0.3 mg/dL (ref 0.3–1.2)
Total Protein: 7.1 g/dL (ref 6.0–8.3)

## 2008-03-16 LAB — CBC WITH DIFFERENTIAL/PLATELET
BASO%: 2 % (ref 0.0–2.0)
Basophils Absolute: 0.1 10*3/uL (ref 0.0–0.1)
EOS%: 0.7 % (ref 0.0–7.0)
Eosinophils Absolute: 0 10*3/uL (ref 0.0–0.5)
HCT: 29.8 % — ABNORMAL LOW (ref 38.7–49.9)
HGB: 10 g/dL — ABNORMAL LOW (ref 13.0–17.1)
LYMPH%: 20.8 % (ref 14.0–48.0)
MCH: 27.8 pg — ABNORMAL LOW (ref 28.0–33.4)
MCHC: 33.6 g/dL (ref 32.0–35.9)
MCV: 82.8 fL (ref 81.6–98.0)
MONO#: 0.8 10*3/uL (ref 0.1–0.9)
MONO%: 21.6 % — ABNORMAL HIGH (ref 0.0–13.0)
NEUT#: 2 10*3/uL (ref 1.5–6.5)
NEUT%: 54.9 % (ref 40.0–75.0)
Platelets: 345 10*3/uL (ref 145–400)
RBC: 3.6 10*6/uL — ABNORMAL LOW (ref 4.20–5.71)
RDW: 18.4 % — ABNORMAL HIGH (ref 11.2–14.6)
WBC: 3.7 10*3/uL — ABNORMAL LOW (ref 4.0–10.0)
lymph#: 0.8 10*3/uL — ABNORMAL LOW (ref 0.9–3.3)

## 2008-03-16 LAB — LACTATE DEHYDROGENASE: LDH: 203 U/L (ref 94–250)

## 2008-03-23 LAB — CBC WITH DIFFERENTIAL/PLATELET
BASO%: 0 % (ref 0.0–2.0)
Basophils Absolute: 0 10*3/uL (ref 0.0–0.1)
EOS%: 0.1 % (ref 0.0–7.0)
Eosinophils Absolute: 0 10*3/uL (ref 0.0–0.5)
HCT: 29.9 % — ABNORMAL LOW (ref 38.7–49.9)
HGB: 10 g/dL — ABNORMAL LOW (ref 13.0–17.1)
LYMPH%: 8.7 % — ABNORMAL LOW (ref 14.0–48.0)
MCH: 27.4 pg — ABNORMAL LOW (ref 28.0–33.4)
MCHC: 33.4 g/dL (ref 32.0–35.9)
MCV: 82.3 fL (ref 81.6–98.0)
MONO#: 0.1 10*3/uL (ref 0.1–0.9)
MONO%: 1.8 % (ref 0.0–13.0)
NEUT#: 3.5 10*3/uL (ref 1.5–6.5)
NEUT%: 89.4 % — ABNORMAL HIGH (ref 40.0–75.0)
Platelets: 286 10*3/uL (ref 145–400)
RBC: 3.63 10*6/uL — ABNORMAL LOW (ref 4.20–5.71)
RDW: 17.2 % — ABNORMAL HIGH (ref 11.2–14.6)
WBC: 4 10*3/uL (ref 4.0–10.0)
lymph#: 0.3 10*3/uL — ABNORMAL LOW (ref 0.9–3.3)

## 2008-03-24 ENCOUNTER — Ambulatory Visit: Payer: Self-pay | Admitting: Oncology

## 2008-03-24 ENCOUNTER — Inpatient Hospital Stay (HOSPITAL_COMMUNITY): Admission: AD | Admit: 2008-03-24 | Discharge: 2008-03-25 | Payer: Self-pay | Admitting: Internal Medicine

## 2008-03-27 LAB — CBC WITH DIFFERENTIAL/PLATELET
BASO%: 0.6 % (ref 0.0–2.0)
Basophils Absolute: 0 10*3/uL (ref 0.0–0.1)
EOS%: 1.6 % (ref 0.0–7.0)
Eosinophils Absolute: 0 10*3/uL (ref 0.0–0.5)
HCT: 25.7 % — ABNORMAL LOW (ref 38.7–49.9)
HGB: 8.5 g/dL — ABNORMAL LOW (ref 13.0–17.1)
LYMPH%: 16.9 % (ref 14.0–48.0)
MCH: 26.8 pg — ABNORMAL LOW (ref 28.0–33.4)
MCHC: 33 g/dL (ref 32.0–35.9)
MCV: 81.4 fL — ABNORMAL LOW (ref 81.6–98.0)
MONO#: 0 10*3/uL — ABNORMAL LOW (ref 0.1–0.9)
MONO%: 1.6 % (ref 0.0–13.0)
NEUT#: 2.1 10*3/uL (ref 1.5–6.5)
NEUT%: 79.3 % — ABNORMAL HIGH (ref 40.0–75.0)
Platelets: 176 10*3/uL (ref 145–400)
RBC: 3.16 10*6/uL — ABNORMAL LOW (ref 4.20–5.71)
RDW: 15.4 % — ABNORMAL HIGH (ref 11.2–14.6)
WBC: 2.7 10*3/uL — ABNORMAL LOW (ref 4.0–10.0)
lymph#: 0.4 10*3/uL — ABNORMAL LOW (ref 0.9–3.3)

## 2008-03-27 LAB — COMPREHENSIVE METABOLIC PANEL
ALT: 63 U/L — ABNORMAL HIGH (ref 0–53)
AST: 32 U/L (ref 0–37)
Albumin: 3.8 g/dL (ref 3.5–5.2)
Alkaline Phosphatase: 94 U/L (ref 39–117)
BUN: 19 mg/dL (ref 6–23)
CO2: 24 mEq/L (ref 19–32)
Calcium: 9 mg/dL (ref 8.4–10.5)
Chloride: 106 mEq/L (ref 96–112)
Creatinine, Ser: 1.87 mg/dL — ABNORMAL HIGH (ref 0.40–1.50)
Glucose, Bld: 243 mg/dL — ABNORMAL HIGH (ref 70–99)
Potassium: 4 mEq/L (ref 3.5–5.3)
Sodium: 137 mEq/L (ref 135–145)
Total Bilirubin: 0.4 mg/dL (ref 0.3–1.2)
Total Protein: 6.3 g/dL (ref 6.0–8.3)

## 2008-03-27 LAB — LACTATE DEHYDROGENASE: LDH: 201 U/L (ref 94–250)

## 2008-04-16 ENCOUNTER — Ambulatory Visit (HOSPITAL_COMMUNITY): Admission: RE | Admit: 2008-04-16 | Discharge: 2008-04-16 | Payer: Self-pay | Admitting: Oncology

## 2008-04-16 LAB — CBC WITH DIFFERENTIAL/PLATELET
BASO%: 0 % (ref 0.0–2.0)
Basophils Absolute: 0 10*3/uL (ref 0.0–0.1)
EOS%: 1.1 % (ref 0.0–7.0)
Eosinophils Absolute: 0.1 10*3/uL (ref 0.0–0.5)
HCT: 27.9 % — ABNORMAL LOW (ref 38.7–49.9)
HGB: 9.2 g/dL — ABNORMAL LOW (ref 13.0–17.1)
LYMPH%: 13.7 % — ABNORMAL LOW (ref 14.0–48.0)
MCH: 27.4 pg — ABNORMAL LOW (ref 28.0–33.4)
MCHC: 32.9 g/dL (ref 32.0–35.9)
MCV: 83.3 fL (ref 81.6–98.0)
MONO#: 0.5 10*3/uL (ref 0.1–0.9)
MONO%: 9.3 % (ref 0.0–13.0)
NEUT#: 3.7 10*3/uL (ref 1.5–6.5)
NEUT%: 75.9 % — ABNORMAL HIGH (ref 40.0–75.0)
Platelets: 210 10*3/uL (ref 145–400)
RBC: 3.35 10*6/uL — ABNORMAL LOW (ref 4.20–5.71)
RDW: 18.1 % — ABNORMAL HIGH (ref 11.2–14.6)
WBC: 4.9 10*3/uL (ref 4.0–10.0)
lymph#: 0.7 10*3/uL — ABNORMAL LOW (ref 0.9–3.3)

## 2008-04-16 LAB — COMPREHENSIVE METABOLIC PANEL
ALT: 27 U/L (ref 0–53)
AST: 26 U/L (ref 0–37)
Albumin: 4.2 g/dL (ref 3.5–5.2)
Alkaline Phosphatase: 94 U/L (ref 39–117)
BUN: 26 mg/dL — ABNORMAL HIGH (ref 6–23)
CO2: 22 mEq/L (ref 19–32)
Calcium: 9.4 mg/dL (ref 8.4–10.5)
Chloride: 106 mEq/L (ref 96–112)
Creatinine, Ser: 1.46 mg/dL (ref 0.40–1.50)
Glucose, Bld: 240 mg/dL — ABNORMAL HIGH (ref 70–99)
Potassium: 4.1 mEq/L (ref 3.5–5.3)
Sodium: 139 mEq/L (ref 135–145)
Total Bilirubin: 0.4 mg/dL (ref 0.3–1.2)
Total Protein: 6.9 g/dL (ref 6.0–8.3)

## 2008-04-16 LAB — LACTATE DEHYDROGENASE: LDH: 293 U/L — ABNORMAL HIGH (ref 94–250)

## 2008-05-04 ENCOUNTER — Encounter: Admission: RE | Admit: 2008-05-04 | Discharge: 2008-05-04 | Payer: Self-pay | Admitting: Internal Medicine

## 2008-05-22 ENCOUNTER — Ambulatory Visit: Payer: Self-pay | Admitting: Internal Medicine

## 2008-05-28 ENCOUNTER — Ambulatory Visit: Payer: Self-pay | Admitting: Oncology

## 2008-05-29 ENCOUNTER — Telehealth (INDEPENDENT_AMBULATORY_CARE_PROVIDER_SITE_OTHER): Payer: Self-pay | Admitting: *Deleted

## 2008-06-14 ENCOUNTER — Ambulatory Visit: Payer: Self-pay | Admitting: Internal Medicine

## 2008-07-12 ENCOUNTER — Ambulatory Visit: Payer: Self-pay | Admitting: Oncology

## 2008-07-16 ENCOUNTER — Ambulatory Visit (HOSPITAL_COMMUNITY): Admission: RE | Admit: 2008-07-16 | Discharge: 2008-07-16 | Payer: Self-pay | Admitting: Oncology

## 2008-07-16 LAB — COMPREHENSIVE METABOLIC PANEL
ALT: 17 U/L (ref 0–53)
AST: 16 U/L (ref 0–37)
Albumin: 4.1 g/dL (ref 3.5–5.2)
Alkaline Phosphatase: 91 U/L (ref 39–117)
BUN: 24 mg/dL — ABNORMAL HIGH (ref 6–23)
CO2: 21 mEq/L (ref 19–32)
Calcium: 9.4 mg/dL (ref 8.4–10.5)
Chloride: 107 mEq/L (ref 96–112)
Creatinine, Ser: 1.5 mg/dL (ref 0.40–1.50)
Glucose, Bld: 199 mg/dL — ABNORMAL HIGH (ref 70–99)
Potassium: 4.1 mEq/L (ref 3.5–5.3)
Sodium: 140 mEq/L (ref 135–145)
Total Bilirubin: 0.3 mg/dL (ref 0.3–1.2)
Total Protein: 6.7 g/dL (ref 6.0–8.3)

## 2008-07-16 LAB — CBC WITH DIFFERENTIAL/PLATELET
BASO%: 0.1 % (ref 0.0–2.0)
Basophils Absolute: 0 10*3/uL (ref 0.0–0.1)
EOS%: 2.1 % (ref 0.0–7.0)
Eosinophils Absolute: 0.1 10*3/uL (ref 0.0–0.5)
HCT: 30.4 % — ABNORMAL LOW (ref 38.7–49.9)
HGB: 10.2 g/dL — ABNORMAL LOW (ref 13.0–17.1)
LYMPH%: 22.3 % (ref 14.0–48.0)
MCH: 27.3 pg — ABNORMAL LOW (ref 28.0–33.4)
MCHC: 33.5 g/dL (ref 32.0–35.9)
MCV: 81.6 fL (ref 81.6–98.0)
MONO#: 0.1 10*3/uL (ref 0.1–0.9)
MONO%: 1.3 % (ref 0.0–13.0)
NEUT#: 3.2 10*3/uL (ref 1.5–6.5)
NEUT%: 74.2 % (ref 40.0–75.0)
Platelets: 222 10*3/uL (ref 145–400)
RBC: 3.72 10*6/uL — ABNORMAL LOW (ref 4.20–5.71)
RDW: 16.5 % — ABNORMAL HIGH (ref 11.2–14.6)
WBC: 4.3 10*3/uL (ref 4.0–10.0)
lymph#: 1 10*3/uL (ref 0.9–3.3)

## 2008-07-16 LAB — LACTATE DEHYDROGENASE: LDH: 176 U/L (ref 94–250)

## 2008-08-24 ENCOUNTER — Ambulatory Visit: Payer: Self-pay | Admitting: Oncology

## 2008-09-20 ENCOUNTER — Encounter: Admission: RE | Admit: 2008-09-20 | Discharge: 2008-09-20 | Payer: Self-pay | Admitting: Internal Medicine

## 2008-09-20 ENCOUNTER — Encounter (INDEPENDENT_AMBULATORY_CARE_PROVIDER_SITE_OTHER): Payer: Self-pay | Admitting: *Deleted

## 2008-10-22 ENCOUNTER — Ambulatory Visit: Payer: Self-pay | Admitting: Oncology

## 2008-10-24 ENCOUNTER — Ambulatory Visit: Payer: Self-pay | Admitting: Internal Medicine

## 2008-10-24 DIAGNOSIS — C8589 Other specified types of non-Hodgkin lymphoma, extranodal and solid organ sites: Secondary | ICD-10-CM | POA: Insufficient documentation

## 2008-10-24 DIAGNOSIS — R1084 Generalized abdominal pain: Secondary | ICD-10-CM | POA: Insufficient documentation

## 2008-10-24 DIAGNOSIS — Z860100 Personal history of colon polyps, unspecified: Secondary | ICD-10-CM | POA: Insufficient documentation

## 2008-10-24 DIAGNOSIS — E119 Type 2 diabetes mellitus without complications: Secondary | ICD-10-CM | POA: Insufficient documentation

## 2008-10-24 LAB — COMPREHENSIVE METABOLIC PANEL
ALT: 13 U/L (ref 0–53)
AST: 13 U/L (ref 0–37)
Albumin: 4 g/dL (ref 3.5–5.2)
Alkaline Phosphatase: 135 U/L — ABNORMAL HIGH (ref 39–117)
BUN: 32 mg/dL — ABNORMAL HIGH (ref 6–23)
CO2: 21 mEq/L (ref 19–32)
Calcium: 9.7 mg/dL (ref 8.4–10.5)
Chloride: 102 mEq/L (ref 96–112)
Creatinine, Ser: 1.92 mg/dL — ABNORMAL HIGH (ref 0.40–1.50)
Glucose, Bld: 454 mg/dL — ABNORMAL HIGH (ref 70–99)
Potassium: 5.6 mEq/L — ABNORMAL HIGH (ref 3.5–5.3)
Sodium: 135 mEq/L (ref 135–145)
Total Bilirubin: 0.3 mg/dL (ref 0.3–1.2)
Total Protein: 7.4 g/dL (ref 6.0–8.3)

## 2008-10-24 LAB — CBC WITH DIFFERENTIAL/PLATELET
BASO%: 0.6 % (ref 0.0–2.0)
Basophils Absolute: 0 10*3/uL (ref 0.0–0.1)
EOS%: 1.4 % (ref 0.0–7.0)
Eosinophils Absolute: 0.1 10*3/uL (ref 0.0–0.5)
HCT: 31.3 % — ABNORMAL LOW (ref 38.4–49.9)
HGB: 10.4 g/dL — ABNORMAL LOW (ref 13.0–17.1)
LYMPH%: 17.3 % (ref 14.0–49.0)
MCH: 26.8 pg — ABNORMAL LOW (ref 27.2–33.4)
MCHC: 33.1 g/dL (ref 32.0–36.0)
MCV: 80.8 fL (ref 79.3–98.0)
MONO#: 0.2 10*3/uL (ref 0.1–0.9)
MONO%: 4.3 % (ref 0.0–14.0)
NEUT#: 3.5 10*3/uL (ref 1.5–6.5)
NEUT%: 76.4 % — ABNORMAL HIGH (ref 39.0–75.0)
Platelets: 248 10*3/uL (ref 140–400)
RBC: 3.87 10*6/uL — ABNORMAL LOW (ref 4.20–5.82)
RDW: 15.1 % — ABNORMAL HIGH (ref 11.0–14.6)
WBC: 4.6 10*3/uL (ref 4.0–10.3)
lymph#: 0.8 10*3/uL — ABNORMAL LOW (ref 0.9–3.3)

## 2008-10-24 LAB — LACTATE DEHYDROGENASE: LDH: 192 U/L (ref 94–250)

## 2008-10-26 ENCOUNTER — Ambulatory Visit (HOSPITAL_COMMUNITY): Admission: RE | Admit: 2008-10-26 | Discharge: 2008-10-26 | Payer: Self-pay | Admitting: Internal Medicine

## 2008-11-01 ENCOUNTER — Telehealth: Payer: Self-pay | Admitting: Internal Medicine

## 2008-11-07 ENCOUNTER — Encounter: Payer: Self-pay | Admitting: Internal Medicine

## 2008-11-07 ENCOUNTER — Ambulatory Visit: Payer: Self-pay | Admitting: Internal Medicine

## 2008-11-09 ENCOUNTER — Encounter: Payer: Self-pay | Admitting: Internal Medicine

## 2008-12-07 ENCOUNTER — Ambulatory Visit: Payer: Self-pay | Admitting: Oncology

## 2009-01-18 ENCOUNTER — Ambulatory Visit: Payer: Self-pay | Admitting: Oncology

## 2009-01-22 ENCOUNTER — Ambulatory Visit (HOSPITAL_COMMUNITY): Admission: RE | Admit: 2009-01-22 | Discharge: 2009-01-22 | Payer: Self-pay | Admitting: Oncology

## 2009-01-25 LAB — CBC WITH DIFFERENTIAL/PLATELET
BASO%: 0.5 % (ref 0.0–2.0)
Basophils Absolute: 0 10*3/uL (ref 0.0–0.1)
EOS%: 2.3 % (ref 0.0–7.0)
Eosinophils Absolute: 0.1 10*3/uL (ref 0.0–0.5)
HCT: 31.6 % — ABNORMAL LOW (ref 38.4–49.9)
HGB: 10.2 g/dL — ABNORMAL LOW (ref 13.0–17.1)
LYMPH%: 32.8 % (ref 14.0–49.0)
MCH: 25.1 pg — ABNORMAL LOW (ref 27.2–33.4)
MCHC: 32.3 g/dL (ref 32.0–36.0)
MCV: 77.6 fL — ABNORMAL LOW (ref 79.3–98.0)
MONO#: 0.3 10*3/uL (ref 0.1–0.9)
MONO%: 7 % (ref 0.0–14.0)
NEUT#: 2.2 10*3/uL (ref 1.5–6.5)
NEUT%: 57.4 % (ref 39.0–75.0)
Platelets: 213 10*3/uL (ref 140–400)
RBC: 4.07 10*6/uL — ABNORMAL LOW (ref 4.20–5.82)
RDW: 16.4 % — ABNORMAL HIGH (ref 11.0–14.6)
WBC: 3.8 10*3/uL — ABNORMAL LOW (ref 4.0–10.3)
lymph#: 1.3 10*3/uL (ref 0.9–3.3)

## 2009-01-25 LAB — COMPREHENSIVE METABOLIC PANEL
ALT: 16 U/L (ref 0–53)
AST: 14 U/L (ref 0–37)
Albumin: 4.1 g/dL (ref 3.5–5.2)
Alkaline Phosphatase: 108 U/L (ref 39–117)
BUN: 25 mg/dL — ABNORMAL HIGH (ref 6–23)
CO2: 22 mEq/L (ref 19–32)
Calcium: 9.4 mg/dL (ref 8.4–10.5)
Chloride: 108 mEq/L (ref 96–112)
Creatinine, Ser: 1.6 mg/dL — ABNORMAL HIGH (ref 0.40–1.50)
Glucose, Bld: 132 mg/dL — ABNORMAL HIGH (ref 70–99)
Potassium: 4.5 mEq/L (ref 3.5–5.3)
Sodium: 140 mEq/L (ref 135–145)
Total Bilirubin: 0.3 mg/dL (ref 0.3–1.2)
Total Protein: 7.2 g/dL (ref 6.0–8.3)

## 2009-01-30 ENCOUNTER — Ambulatory Visit (HOSPITAL_COMMUNITY): Admission: RE | Admit: 2009-01-30 | Discharge: 2009-01-30 | Payer: Self-pay | Admitting: Oncology

## 2009-02-28 ENCOUNTER — Ambulatory Visit: Payer: Self-pay | Admitting: Cardiology

## 2009-03-01 ENCOUNTER — Encounter: Payer: Self-pay | Admitting: Internal Medicine

## 2009-03-01 ENCOUNTER — Encounter: Payer: Self-pay | Admitting: Cardiology

## 2009-03-01 ENCOUNTER — Inpatient Hospital Stay (HOSPITAL_COMMUNITY): Admission: EM | Admit: 2009-03-01 | Discharge: 2009-03-02 | Payer: Self-pay | Admitting: Emergency Medicine

## 2009-03-01 ENCOUNTER — Ambulatory Visit: Payer: Self-pay | Admitting: Internal Medicine

## 2009-04-26 ENCOUNTER — Ambulatory Visit: Payer: Self-pay | Admitting: Oncology

## 2009-05-22 LAB — CBC WITH DIFFERENTIAL/PLATELET
BASO%: 0.4 % (ref 0.0–2.0)
Basophils Absolute: 0 10*3/uL (ref 0.0–0.1)
EOS%: 1 % (ref 0.0–7.0)
Eosinophils Absolute: 0.1 10*3/uL (ref 0.0–0.5)
HCT: 36.1 % — ABNORMAL LOW (ref 38.4–49.9)
HGB: 11.9 g/dL — ABNORMAL LOW (ref 13.0–17.1)
LYMPH%: 24.8 % (ref 14.0–49.0)
MCH: 27.2 pg (ref 27.2–33.4)
MCHC: 32.9 g/dL (ref 32.0–36.0)
MCV: 82.6 fL (ref 79.3–98.0)
MONO#: 0.4 10*3/uL (ref 0.1–0.9)
MONO%: 6.8 % (ref 0.0–14.0)
NEUT#: 3.8 10*3/uL (ref 1.5–6.5)
NEUT%: 67 % (ref 39.0–75.0)
Platelets: 231 10*3/uL (ref 140–400)
RBC: 4.37 10*6/uL (ref 4.20–5.82)
RDW: 16.3 % — ABNORMAL HIGH (ref 11.0–14.6)
WBC: 5.7 10*3/uL (ref 4.0–10.3)
lymph#: 1.4 10*3/uL (ref 0.9–3.3)

## 2009-05-22 LAB — COMPREHENSIVE METABOLIC PANEL
ALT: 30 U/L (ref 0–53)
AST: 23 U/L (ref 0–37)
Albumin: 4.2 g/dL (ref 3.5–5.2)
Alkaline Phosphatase: 117 U/L (ref 39–117)
BUN: 27 mg/dL — ABNORMAL HIGH (ref 6–23)
CO2: 26 mEq/L (ref 19–32)
Calcium: 9.7 mg/dL (ref 8.4–10.5)
Chloride: 95 mEq/L — ABNORMAL LOW (ref 96–112)
Creatinine, Ser: 1.86 mg/dL — ABNORMAL HIGH (ref 0.40–1.50)
Glucose, Bld: 526 mg/dL (ref 70–99)
Potassium: 4.8 mEq/L (ref 3.5–5.3)
Sodium: 132 mEq/L — ABNORMAL LOW (ref 135–145)
Total Bilirubin: 0.6 mg/dL (ref 0.3–1.2)
Total Protein: 7.8 g/dL (ref 6.0–8.3)

## 2009-07-04 ENCOUNTER — Emergency Department (HOSPITAL_COMMUNITY): Admission: EM | Admit: 2009-07-04 | Discharge: 2009-07-04 | Payer: Self-pay | Admitting: Emergency Medicine

## 2009-08-16 ENCOUNTER — Ambulatory Visit: Payer: Self-pay | Admitting: Oncology

## 2009-08-22 LAB — CBC WITH DIFFERENTIAL/PLATELET
BASO%: 0.7 % (ref 0.0–2.0)
Basophils Absolute: 0 10*3/uL (ref 0.0–0.1)
EOS%: 1.9 % (ref 0.0–7.0)
Eosinophils Absolute: 0.1 10*3/uL (ref 0.0–0.5)
HCT: 31.5 % — ABNORMAL LOW (ref 38.4–49.9)
HGB: 10.6 g/dL — ABNORMAL LOW (ref 13.0–17.1)
LYMPH%: 27.6 % (ref 14.0–49.0)
MCH: 27.4 pg (ref 27.2–33.4)
MCHC: 33.5 g/dL (ref 32.0–36.0)
MCV: 81.7 fL (ref 79.3–98.0)
MONO#: 0.4 10*3/uL (ref 0.1–0.9)
MONO%: 6.8 % (ref 0.0–14.0)
NEUT#: 3.7 10*3/uL (ref 1.5–6.5)
NEUT%: 63 % (ref 39.0–75.0)
Platelets: 228 10*3/uL (ref 140–400)
RBC: 3.86 10*6/uL — ABNORMAL LOW (ref 4.20–5.82)
RDW: 16 % — ABNORMAL HIGH (ref 11.0–14.6)
WBC: 5.9 10*3/uL (ref 4.0–10.3)
lymph#: 1.6 10*3/uL (ref 0.9–3.3)

## 2009-08-22 LAB — COMPREHENSIVE METABOLIC PANEL
ALT: 19 U/L (ref 0–53)
AST: 17 U/L (ref 0–37)
Albumin: 4.2 g/dL (ref 3.5–5.2)
Alkaline Phosphatase: 108 U/L (ref 39–117)
BUN: 27 mg/dL — ABNORMAL HIGH (ref 6–23)
CO2: 22 mEq/L (ref 19–32)
Calcium: 9.2 mg/dL (ref 8.4–10.5)
Chloride: 100 mEq/L (ref 96–112)
Creatinine, Ser: 1.86 mg/dL — ABNORMAL HIGH (ref 0.40–1.50)
Glucose, Bld: 398 mg/dL — ABNORMAL HIGH (ref 70–99)
Potassium: 4.3 mEq/L (ref 3.5–5.3)
Sodium: 134 mEq/L — ABNORMAL LOW (ref 135–145)
Total Bilirubin: 0.3 mg/dL (ref 0.3–1.2)
Total Protein: 7 g/dL (ref 6.0–8.3)

## 2009-08-22 LAB — LACTATE DEHYDROGENASE: LDH: 183 U/L (ref 94–250)

## 2009-09-05 ENCOUNTER — Ambulatory Visit (HOSPITAL_COMMUNITY): Admission: RE | Admit: 2009-09-05 | Discharge: 2009-09-05 | Payer: Self-pay | Admitting: Oncology

## 2009-09-27 ENCOUNTER — Ambulatory Visit: Payer: Self-pay | Admitting: Vascular Surgery

## 2009-11-26 ENCOUNTER — Ambulatory Visit: Payer: Self-pay | Admitting: Oncology

## 2009-11-28 LAB — COMPREHENSIVE METABOLIC PANEL
ALT: 23 U/L (ref 0–53)
AST: 23 U/L (ref 0–37)
Albumin: 4.3 g/dL (ref 3.5–5.2)
Alkaline Phosphatase: 81 U/L (ref 39–117)
BUN: 29 mg/dL — ABNORMAL HIGH (ref 6–23)
CO2: 22 mEq/L (ref 19–32)
Calcium: 9.7 mg/dL (ref 8.4–10.5)
Chloride: 109 mEq/L (ref 96–112)
Creatinine, Ser: 1.9 mg/dL — ABNORMAL HIGH (ref 0.40–1.50)
Glucose, Bld: 43 mg/dL — CL (ref 70–99)
Potassium: 4.5 mEq/L (ref 3.5–5.3)
Sodium: 143 mEq/L (ref 135–145)
Total Bilirubin: 0.3 mg/dL (ref 0.3–1.2)
Total Protein: 7.5 g/dL (ref 6.0–8.3)

## 2009-11-28 LAB — CBC WITH DIFFERENTIAL/PLATELET
BASO%: 0.1 % (ref 0.0–2.0)
Basophils Absolute: 0 10*3/uL (ref 0.0–0.1)
EOS%: 2.6 % (ref 0.0–7.0)
Eosinophils Absolute: 0.2 10*3/uL (ref 0.0–0.5)
HCT: 35.5 % — ABNORMAL LOW (ref 38.4–49.9)
HGB: 11.7 g/dL — ABNORMAL LOW (ref 13.0–17.1)
LYMPH%: 31.9 % (ref 14.0–49.0)
MCH: 27.1 pg — ABNORMAL LOW (ref 27.2–33.4)
MCHC: 33.1 g/dL (ref 32.0–36.0)
MCV: 81.9 fL (ref 79.3–98.0)
MONO#: 0.7 10*3/uL (ref 0.1–0.9)
MONO%: 8.3 % (ref 0.0–14.0)
NEUT#: 4.5 10*3/uL (ref 1.5–6.5)
NEUT%: 57.1 % (ref 39.0–75.0)
Platelets: 277 10*3/uL (ref 140–400)
RBC: 4.34 10*6/uL (ref 4.20–5.82)
RDW: 16.4 % — ABNORMAL HIGH (ref 11.0–14.6)
WBC: 7.8 10*3/uL (ref 4.0–10.3)
lymph#: 2.5 10*3/uL (ref 0.9–3.3)

## 2009-11-28 LAB — LACTATE DEHYDROGENASE: LDH: 236 U/L (ref 94–250)

## 2009-12-11 ENCOUNTER — Ambulatory Visit: Admission: RE | Admit: 2009-12-11 | Discharge: 2009-12-11 | Payer: Self-pay | Admitting: Internal Medicine

## 2009-12-11 ENCOUNTER — Ambulatory Visit: Payer: Self-pay | Admitting: Vascular Surgery

## 2009-12-11 ENCOUNTER — Encounter (INDEPENDENT_AMBULATORY_CARE_PROVIDER_SITE_OTHER): Payer: Self-pay | Admitting: Internal Medicine

## 2010-02-26 ENCOUNTER — Ambulatory Visit: Payer: Self-pay | Admitting: Oncology

## 2010-02-28 LAB — CBC WITH DIFFERENTIAL/PLATELET
BASO%: 0.2 % (ref 0.0–2.0)
Basophils Absolute: 0 10*3/uL (ref 0.0–0.1)
EOS%: 2.1 % (ref 0.0–7.0)
Eosinophils Absolute: 0.1 10*3/uL (ref 0.0–0.5)
HCT: 28.7 % — ABNORMAL LOW (ref 38.4–49.9)
HGB: 9.7 g/dL — ABNORMAL LOW (ref 13.0–17.1)
LYMPH%: 19.2 % (ref 14.0–49.0)
MCH: 27.4 pg (ref 27.2–33.4)
MCHC: 33.8 g/dL (ref 32.0–36.0)
MCV: 81.1 fL (ref 79.3–98.0)
MONO#: 0.5 10*3/uL (ref 0.1–0.9)
MONO%: 7.4 % (ref 0.0–14.0)
NEUT#: 4.4 10*3/uL (ref 1.5–6.5)
NEUT%: 71.1 % (ref 39.0–75.0)
Platelets: 253 10*3/uL (ref 140–400)
RBC: 3.54 10*6/uL — ABNORMAL LOW (ref 4.20–5.82)
RDW: 17.5 % — ABNORMAL HIGH (ref 11.0–14.6)
WBC: 6.1 10*3/uL (ref 4.0–10.3)
lymph#: 1.2 10*3/uL (ref 0.9–3.3)

## 2010-02-28 LAB — COMPREHENSIVE METABOLIC PANEL
ALT: 21 U/L (ref 0–53)
AST: 19 U/L (ref 0–37)
Albumin: 3.8 g/dL (ref 3.5–5.2)
Alkaline Phosphatase: 77 U/L (ref 39–117)
BUN: 33 mg/dL — ABNORMAL HIGH (ref 6–23)
CO2: 21 mEq/L (ref 19–32)
Calcium: 9.3 mg/dL (ref 8.4–10.5)
Chloride: 110 mEq/L (ref 96–112)
Creatinine, Ser: 1.78 mg/dL — ABNORMAL HIGH (ref 0.40–1.50)
Glucose, Bld: 89 mg/dL (ref 70–99)
Potassium: 4.5 mEq/L (ref 3.5–5.3)
Sodium: 141 mEq/L (ref 135–145)
Total Bilirubin: 0.2 mg/dL — ABNORMAL LOW (ref 0.3–1.2)
Total Protein: 6.5 g/dL (ref 6.0–8.3)

## 2010-02-28 LAB — LACTATE DEHYDROGENASE: LDH: 204 U/L (ref 94–250)

## 2010-05-28 ENCOUNTER — Ambulatory Visit: Payer: Self-pay | Admitting: Oncology

## 2010-06-03 ENCOUNTER — Ambulatory Visit (HOSPITAL_COMMUNITY)
Admission: RE | Admit: 2010-06-03 | Discharge: 2010-06-03 | Payer: Self-pay | Source: Home / Self Care | Attending: Oncology | Admitting: Oncology

## 2010-06-22 ENCOUNTER — Encounter: Payer: Self-pay | Admitting: Oncology

## 2010-07-01 NOTE — Procedures (Signed)
Summary: EGD   EGD  Procedure date:  06/14/2008  Findings:      Location: [place]    ENDOSCOPY PROCEDURE REPORT  PATIENT:  [name], [name]  MR#:  [id number] BIRTHDATE:   [birth year]   GENDER:   male  ENDOSCOPIST:   Docia Chuck. Geri Seminole, MD Referred by: Burnard Bunting, [name]  PROCEDURE DATE:  06/14/2008 PROCEDURE:  EGD, diagnostic ASA CLASS:   Class III INDICATIONS: abdominal pain ; HISTORY OF IIA LARGE CELL LYMPHOMA; DIRECT REFERAL  MEDICATIONS:    Versed 7 mg IV, Fentanyl 75 mcg IV TOPICAL ANESTHETIC:   Cetacaine Spray  DESCRIPTION OF PROCEDURE:   After the risks benefits and alternatives of the procedure were thoroughly explained, informed consent was obtained.  The [facility] GIF-H180 [id number] endoscope was introduced through the mouth and advanced to the second portion of the duodenum, without limitations.  The instrument was slowly withdrawn as the mucosa was fully examined. <<PROCEDUREIMAGES>>          <<OLD IMAGES>>  The upper, middle, and distal third of the esophagus were carefully inspected and no abnormalities were noted. The z-line was well seen at the GEJ. The endoscope was pushed into the fundus which was normal including a retroflexed view. The antrum,gastric body, first and second part of the duodenum were unremarkable.    Retroflexed views revealed no abnormalities.    The scope was then withdrawn from the patient and the procedure completed.  COMPLICATIONS:   None  ENDOSCOPIC IMPRESSION:  1) Normal EGD - NO CAUSE FOR PAIN FOUND ON ENDOSCOPY  RECOMMENDATIONS:  RETURN TO THE CARE OF DR. Reynaldo Minium      _______________________________ Docia Chuck. Geri Seminole, MD    CC: Burnard Bunting MD, Zola Button MD, The Patient

## 2010-07-01 NOTE — Procedures (Signed)
Summary: Colonoscopy   Colonoscopy  Procedure date:  11/07/2008  Findings:      Location:  [place].    Procedures Next Due Date:    Colonoscopy: 10/2012  COLONOSCOPY PROCEDURE REPORT  PATIENT:  [name], [name]  MR#:  [id number] BIRTHDATE:   04-Feb-1951, 60 yrs. old   GENDER:   male  ENDOSCOPIST:   John Chuck. Geri Seminole, MD Referred by: Surveillance Program Recall,  PROCEDURE DATE:  11/07/2008 PROCEDURE:  Colonoscopy with snare polypectomy ASA CLASS:   Class III INDICATIONS: history of pre-cancerous (adenomatous) colon polyps (10-2004 - multiple adenomas)  MEDICATIONS:    Fentanyl 75 mcg IV, Versed 8 mg IV  DESCRIPTION OF PROCEDURE:   After the risks benefits and alternatives of the procedure were thoroughly explained, informed consent was obtained.  Digital rectal exam was performed and revealed no abnormalities.   The LB CF-[id number] endoscope was introduced through the anus and advanced to the cecum, which was identified by both the appendix and ileocecal valve, without limitations.  TIME TO CECUM = 4:24MIN. The quality of the prep was fair - adequate (w/vigorous irrigation), using MiraLax.  The instrument was then withdrawn (TIME = 16:32 MIN) as the colon was fully examined. <<PROCEDUREIMAGES>>          <<OLD IMAGES>>  FINDINGS:  Two polyps were found; one 75mm in the cecum and one 70mm in transverse. Polyps were snared without cautery. Retrieval was successful.   This was otherwise a normal examination of the colon.   Retroflexed views in the rectum revealed no abnormalities.    The scope was then withdrawn from the patient and the procedure completed.  COMPLICATIONS:   None  ENDOSCOPIC IMPRESSION:  1) Two polyps - removed  2) Otherwise normal examination RECOMMENDATIONS:  1) Follow up colonoscopy in 4 years (split prep)    _______________________________ John Chuck. Geri Seminole, MD  CC: John Bunting, MD;  The Patient;  John Button, MD      REPORT  OF SURGICAL PATHOLOGY   Case #: [id number] Patient Name: [name], [name] Office Chart Number:  [id number]   MRN: [id number] Pathologist: John Nose. Saralyn Pilar, MD DOB/Age  01-24-1951 (Age: 60)    Gender: M Date Taken:  11/07/2008 Date Received: 11/07/2008   FINAL DIAGNOSIS   ***MICROSCOPIC EXAMINATION AND DIAGNOSIS***   1.  COLON, CECAL POLYP:  TUBULAR ADENOMA.  NO HIGH GRADE DYSPLASIA OR MALIGNANCY IDENTIFIED.   2.  COLON,  TRANSVERSE POLYP:  TUBULAR ADENOMA.  NO HIGH GRADE DYSPLASIA OR MALIGNANCY IDENTIFIED.   gdt Date Reported:  11/09/2008     John Nose. Saralyn Pilar, MD *** Electronically Signed Out By JDP ***    November 09, 2008 MRN: [id number]    [name] [address] [place], [place]  [address]    Dear [name],  I am pleased to inform you that the colon polyp(s) removed during your recent colonoscopy was (were) found to be benign (no cancer detected) upon pathologic examination.  I recommend you have a repeat colonoscopy examination in 4 years to look for recurrent polyps, as having colon polyps increases your risk for having recurrent polyps or even colon cancer in the future.  Should you develop new or worsening symptoms of abdominal pain, bowel habit changes or bleeding from the rectum or bowels, please schedule an evaluation with either your primary care physician or with me.  Additional information/recommendations:  __ No further action with gastroenterology is needed at this time. Please  follow-up with your primary care physician for your other healthcare      needs.   Please call us if you are having persistent problems or have questions about your condition that have not been fully answered at this time.  Sincerely,  John Shipper MD  This letter has been electronically signed by your physician.   Signed by John Shipper MD on 11/09/2008 at 1:05 PM This report was created from the original endoscopy report, which was reviewed and signed by the above listed  endoscopist.

## 2010-07-01 NOTE — Procedures (Signed)
Summary: colonoscopy   Colonoscopy  Procedure date:  11/10/2004  Findings:      Location:  [place].  Pathology:  Adenomatous polyp.          Comments:      Repeat colonoscopy in 3 years.     Procedures Next Due Date:    Colonoscopy: 10/2007 Patient Name: [name]. MRN:  Procedure Procedures: Colonoscopy CPT: [phone].  Personnel: Endoscopist: Clarene Reamer, MD.  Exam Location: Exam performed in Outpatient Clinic. Outpatient  Patient Consent: Procedure, Alternatives, Risks and Benefits discussed, consent obtained, from patient. Consent was obtained by the RN.  Indications  Surveillance of: Adenomatous Polyp(s).  History  Current Medications: Patient is not currently taking Coumadin.  Pre-Exam Physical: Performed Nov 10, 2004. Entire physical exam was normal.  Comments: Pt. history reviewed/updated, physical exam performed prior to initiation of sedation? Exam Exam: Extent of exam reached: Cecum, extent intended: Cecum.  The cecum was identified by appendiceal orifice and IC valve. Colon retroflexion performed. Images taken. ASA Classification: II. Tolerance: good.  Monitoring: Pulse and BP monitoring, Oximetry used. Supplemental O2 given.  Colon Prep Prep results: good.  Sedation Meds: Patient assessed and found to be appropriate for moderate (conscious) sedation. Fentanyl given IV. Versed given IV.  Findings POLYP: Transverse Colon, Maximum size: 4 mm. sessile polyp. Procedure:  hot biopsy, removed, not retrieved, ICD9: Colon Polyps: 211.3.  POLYP: Transverse Colon, Maximum size: 3 mm. sessile polyp. Procedure:  hot biopsy, removed, not retrieved,  POLYP: Descending Colon, Maximum size: 7 mm. sessile polyp. Procedure:  snare with cautery, removed, retrieved, Polyp sent to pathology. ICD9: Colon Polyps: 211.3.  - DIVERTICULOSIS: Descending Colon to Sigmoid Colon. ICD9: Diverticulosis: 562.10. Comments: mild-moderate.  -  HEMORRHOIDS: Internal and External. Size: Grade II. ICD9: Hemorrhoids, Internal and  External: 455.6.   Assessment Abnormal examination, see findings above.  Diagnoses: 211.3: Colon Polyps.  562.10: Diverticulosis.  455.6: Hemorrhoids, Internal and  External.   Events  Unplanned Interventions: No intervention was required.  Unplanned Events: There were no complications. Plans Medication Plan: Await pathology. Continue current medications.  Patient Education: Patient given standard instructions for: Polyps. Diverticulosis. Hemorrhoids. Yearly hemoccult testing recommended. Patient instructed to get routine colonoscopy every 4 years.  Disposition: After procedure patient sent to recovery. After recovery patient sent home.   This report was created from the original endoscopy report, which was reviewed and signed by the above listed endoscopist.   cc:  Burnard Bunting, MD      The Patient

## 2010-07-01 NOTE — Letter (Signed)
Summary: Patient Notice- Polyp Results  [facility]  [address] [place], [place] [address]   Phone: [phone]  Fax: [phone]        November 09, 2008 MRN: [id number]    MANNIX HARKINS [address] [place], [place]  [address]    Dear Mr. Molzahn,  I am pleased to inform you that the colon polyp(s) removed during your recent colonoscopy was (were) found to be benign (no cancer detected) upon pathologic examination.  I recommend you have a repeat colonoscopy examination in 4 years to look for recurrent polyps, as having colon polyps increases your risk for having recurrent polyps or even colon cancer in the future.  Should you develop new or worsening symptoms of abdominal pain, bowel habit changes or bleeding from the rectum or bowels, please schedule an evaluation with either your primary care physician or with me.  Additional information/recommendations:  __ No further action with gastroenterology is needed at this time. Please      follow-up with your primary care physician for your other healthcare      needs.   Please call us if you are having persistent problems or have questions about your condition that have not been fully answered at this time.  Sincerely,  Irene Shipper MD  This letter has been electronically signed by your physician.

## 2010-07-01 NOTE — Progress Notes (Signed)
  Phone Note Outgoing Call   Call placed by: cheryl Call placed to: DR.Aronson Summary of Call: Peter Congo at Dr.Aronson's office ntfd. that pt. cx. appt. for EGD that was scheduled for today Initial call taken by: Abel Presto RN,  May 29, 2008 11:58 AM

## 2010-07-01 NOTE — Miscellaneous (Signed)
Summary: [facility] Previsit/prep  Clinical Lists Changes  Observations: Added new observation of NKA: T (05/22/2008 13:00)

## 2010-07-01 NOTE — Progress Notes (Signed)
Summary: Unable to afford prep kit  Phone Note Call from Patient Call back at [facility] Phone [phone]   Call For: Dr  [name] Reason for Call: Talk to Nurse Summary of Call: Colon Monday 6-7. Unable to afford the prep kit. Can he reschedule without a penalty? Initial call taken by: Irwin Brakeman [facility],  November 01, 2008 10:04 AM  Follow-up for Phone Call        Patient cannot afford the Movi Prep.  Can I switch him to Miralax or do you want him to have Movi?  Please advise.  Thanks. Randye Lobo CMA  November 02, 2008 8:38 AM   OK to switch to MiraLax. Thanks Follow-up by: Irene Shipper MD,  November 02, 2008 9:29 AM  Additional Follow-up for Phone Call Additional follow up Details #1::        Miralax prep sent to [facility].  Patient called and notified.   Will give patient directions for Miralax.  Randye Lobo CMA  November 02, 2008 9:49 AM     New/Updated Medications: MIRALAX   POWD (POLYETHYLENE GLYCOL 3350) As per prep  instructions. METOCLOPRAMIDE HCL 10 MG  TABS (METOCLOPRAMIDE HCL) As per prep instructions. DULCOLAX 5 MG  TBEC (BISACODYL) Day before procedure take 2 at 3pm and 2 at 8pm.   Prescriptions: DULCOLAX 5 MG  TBEC (BISACODYL) Day before procedure take 2 at 3pm and 2 at 8pm.  #4 x 0   Entered by:   Randye Lobo CMA   Authorized by:   Irene Shipper MD   Signed by:   Randye Lobo CMA on 11/02/2008   Method used:   Electronically to        [facility] [phone]* (retail)       [place], [place]  [address]       Ph: [phone] or [phone]       Fax: [phone]   RxID:   [id number] METOCLOPRAMIDE HCL 10 MG  TABS (METOCLOPRAMIDE HCL) As per prep instructions.  #2 x 0   Entered by:   Randye Lobo CMA   Authorized by:   Irene Shipper MD   Signed by:   Randye Lobo CMA on 11/02/2008   Method used:   Electronically to        [facility] [phone]* (retail)       [place], [place]  [address]       Ph: [phone] or [phone]       Fax: [phone]   RxID:   [id number] MIRALAX   POWD (POLYETHYLENE GLYCOL 3350) As per prep  instructions.  #255gm x 0   Entered by:   Randye Lobo CMA   Authorized by:   Irene Shipper MD   Signed by:   Randye Lobo CMA on 11/02/2008   Method used:   Electronically to        [facility] [phone]* (retail)       [address] [place],   [address]       Ph: [phone] or [phone]       Fax: [phone]   RxID:   [id number]

## 2010-07-01 NOTE — Assessment & Plan Note (Signed)
Summary: CHRONIC ABDOMINAL PAIN   History of Present Illness Visit Type: consult Primary GI MD: Scarlette Shorts MD Primary Ronell Boldin: Burnard Bunting, MD Requesting Lexiana Spindel: Burnard Bunting, MD Chief Complaint: epigastric and LUQ pain since beginning chemo early 2009 History of Present Illness:   60 year old African American male with hypertension, diabetes, adenomatous colon polyps, and lymphoma for which she has undergone chemotherapy. The patient presents today for evaluation of chronic abdominal pain. The patient reports being diagnosed with abdominal lymphoma around May of 2009. He subsequently underwent treatment with chemotherapy. This was completed in September of 2009. He reports chronic severe abdominal pain since the time of his diagnosis of lymphoma. He was sent to me in January, as a direct referral, for upper endoscopy. This was entirely normal. Other evaluation has included a CT scan of the abdomen and pelvis which was performed September 20, 2008. There were no significant abnormalities of the abdomen. The previously noted soft tissue density within the central mesentery was demonstrating further decrease in size and now measured 1.6 x 1.2 cm(reviewed). The patient describes the pain as mid abdominal. He rubs his abdomen in a circular fashion. He is very vague about its characteristics. He cannot identify exacerbating factors but states that he has a decreased appetite. He lost 20 pounds after being diagnosed with lymphoma, but his weight as of recent has been stable. He reports regular bowel habits. The discomfort is not affected by defecation. He tells me on several occasions that he has difficulty sleeping and that he is unable go back to work. He feels fatigued and weak and began mentions that he could not resume his prior job do is physical nature. OxyContin will help his pain somewhat. His last colonoscopy was performed in June of 2006. He was found to have multiple adenomas. Follow up in 3  years recommended.He admits to being anxious and worried.   GI Review of Systems    Reports abdominal pain.     Location of  Abdominal pain: generalized.    Denies acid reflux, belching, bloating, chest pain, dysphagia with liquids, dysphagia with solids, heartburn, loss of appetite, nausea, vomiting, vomiting blood, weight loss, and  weight gain.        Denies anal fissure, black tarry stools, change in bowel habit, constipation, diarrhea, diverticulosis, fecal incontinence, heme positive stool, hemorrhoids, irritable bowel syndrome, jaundice, light color stool, liver problems, rectal bleeding, and  rectal pain.    Current Medications (verified): 1)  Humulin 70/30 70-30 % Susp (Insulin Isophane & Regular) .... Two Times A Day As Directed 2)  Lotrel 5-20 Mg Caps (Amlodipine Besy-Benazepril Hcl) .Marland Kitchen.. 1 Tablet By Mouth Once Daily 3)  Aspirin 81 Mg Tbec (Aspirin) .Marland Kitchen.. 1 Tablet By Mouth Once Daily 4)  Centrum  Tabs (Multiple Vitamins-Minerals) .... Take 1 Tablet By Mouth Once A Day  Allergies (verified): No Known Drug Allergies  Past History:  Past Surgical History: Last updated: 10/23/2008 Unremarkable  Family History: Last updated: 10/24/2008 No FH of Colon Cancer: Family History of Diabetes: Father  Social History: Last updated: 10/24/2008 Occupation: Disabled Single Patient has never smoked.  Alcohol Use - no Illicit Drug Use - no Patient does not get regular exercise.   Risk Factors: Exercise: no (10/24/2008)  Risk Factors: Smoking Status: never (10/24/2008)  Past Medical History: Adenomatous Colon Polyps Diabetes Hypertension Lymphoma  Family History: No FH of Colon Cancer: Family History of Diabetes: Father  Social History: Occupation: Disabled Single Patient has never smoked.  Alcohol Use - no Illicit  Drug Use - no Patient does not get regular exercise.  Smoking Status:  never Drug Use:  no Does Patient Exercise:  no  Review of Systems        insomnia, anxiety, fatigue. All other review of systems is negative  Vital Signs:  Patient profile:   60 year old male Height:      75.5 inches Weight:      273 pounds BMI:     33.79 Pulse rate:   69 / minute Pulse rhythm:   irregular BP sitting:   124 / 60  (left arm) Cuff size:   regular  Vitals Entered By: [place] (Oct 24, 2008 1:46 PM)  Physical Exam  General:  Well developed, well nourished, no acute distress. Head:  Normocephalic and atraumatic. Eyes:  PERRLA, no icterus. Nose:  No deformity, discharge,  or lesions. Mouth:  No deformity or lesions, dentition normal. Neck:  Supple; no masses or thyromegaly. Chest Wall:  Symmetrical,  no deformities . Lungs:  Clear throughout to auscultation. Heart:  Regular rate and rhythm; no murmurs, rubs,  or bruits. Abdomen:  Soft, nontender and nondistended. No masses, hepatosplenomegaly or hernias noted. Normal bowel sounds. Rectal:  deferred Msk:  Symmetrical with no gross deformities. Normal posture. Pulses:  Normal pulses noted. Extremities:  No clubbing, cyanosis, edema or deformities noted. Neurologic:  Alert and  oriented x4;  grossly normal neurologically. Skin:  Intact without significant lesions or rashes. Cervical Nodes:  No significant cervical adenopathy.no supraclavicular adenopathy Axillary Nodes:  No significant axillary adenopathy. Inguinal Nodes:  No significant inguinal adenopathy. Psych:  Alert and cooperative. Normal mood and affect.   Impression & Recommendations:  Problem # 1:  ABDOMINAL PAIN -GENERALIZED (ICD-789.07) Chronic stable vague abdominal discomfort. No obvious cause by history, CT scan, or upper endoscopy. Last colonoscopy 4 years ago as described. I am concerned, with his worries over cancer and his complaints of fatigue and insomnia, as well as concerns over employment, that he may be suffering from anxiety and depression with associated functional abdominal pain. I discussed this  with him. Before concluding such, we discussed additional workup such as abdominal ultrasound to rule out gallstones and surveillance colonoscopy given his history of multiple adenomatous. If this workup is unrevealing, then he should return to Dr. Reynaldo Minium for treatment of anxiety and depression. I imagine that this will help him significantly.  Plan: #1. Abdominal ultrasound #2. Colonoscopy #3. Return to Dr. Reynaldo Minium for treatment of anxiety/depression if the above unrevealing.  Problem # 2:  PERSONAL HX COLONIC POLYPS (ICD-V12.72) history of multiple adenomatous colon polyps. One year overdue for surveillance.  Plan: #1. Colonoscopy. The nature of the procedure as well as the risks, benefits, and alternatives were again reviewed. He understood and agreed to proceed. Movi prep prescribed. Instructions regarding the preparation reviewed #2. Hold insulin in the morning of the procedure to avoid on wanted hypoglycemia.  Problem # 3:  DIABETES MELLITUS-TYPE II (ICD-250.00) insulin requiring diabetes. We will need to adjust his insulin for his procedure as de.scribed above.  Problem # 4:  LYMPHOMA (ICD-202.80) History of lymphoma. Apparently in remission post chemotherapy. I'm told he will have his Port-A-Cath for 3 months, then if no evidence of disease, it will be removed. We will obtain records from his hematologist for review  Other Orders: Ultrasound Abdomen (UAS) Colonoscopy (Colon)  Patient Instructions: 1)  Colonoscopy [place] 11/05/08 9:30 2)  Movi prep instructions given to patient 3)  Movi prep Rx. sent to pharmacy.  4)  Patient to hold Insulin morning of procedure. 5)  Abdominal Ultrasound scheduled at [facility] 10/26/08 9:00 am 6)  Colonoscopy and Flexible Sigmoidoscopy brochure given.  7)  The medication list was reviewed and reconciled.  All changed / newly prescribed medications were explained.  A complete medication list was provided to the patient / caregiver. 8)  Copy to: Dr. Burnard Bunting, Dr. Zola Button Prescriptions: MOVIPREP 100 GM  SOLR (PEG-KCL-NACL-NASULF-NA ASC-C) As per prep instructions.  #1 x 0   Entered by:   Randye Lobo CMA   Authorized by:   Irene Shipper MD   Signed by:   Randye Lobo CMA on 10/24/2008   Method used:   Electronically to        [facility] [phone]* (retail)       [address] [place], [place]  [address]       Ph: [phone] or [phone]       Fax: [phone]   RxID:   [id number]

## 2010-08-11 LAB — GLUCOSE, CAPILLARY: Glucose-Capillary: 77 mg/dL (ref 70–99)

## 2010-08-20 LAB — CK TOTAL AND CKMB (NOT AT ARMC)
CK, MB: 5.6 ng/mL — ABNORMAL HIGH (ref 0.3–4.0)
Relative Index: 3.8 — ABNORMAL HIGH (ref 0.0–2.5)
Total CK: 149 U/L (ref 7–232)

## 2010-08-20 LAB — DIFFERENTIAL
Basophils Absolute: 0 10*3/uL (ref 0.0–0.1)
Basophils Relative: 0 % (ref 0–1)
Eosinophils Absolute: 0.1 10*3/uL (ref 0.0–0.7)
Eosinophils Relative: 3 % (ref 0–5)
Lymphocytes Relative: 27 % (ref 12–46)
Lymphs Abs: 1.4 10*3/uL (ref 0.7–4.0)
Monocytes Absolute: 0.4 10*3/uL (ref 0.1–1.0)
Monocytes Relative: 8 % (ref 3–12)
Neutro Abs: 3.3 10*3/uL (ref 1.7–7.7)
Neutrophils Relative %: 63 % (ref 43–77)

## 2010-08-20 LAB — CBC
HCT: 32.7 % — ABNORMAL LOW (ref 39.0–52.0)
Hemoglobin: 11 g/dL — ABNORMAL LOW (ref 13.0–17.0)
MCHC: 33.7 g/dL (ref 30.0–36.0)
MCV: 82.7 fL (ref 78.0–100.0)
Platelets: 200 10*3/uL (ref 150–400)
RBC: 3.95 MIL/uL — ABNORMAL LOW (ref 4.22–5.81)
RDW: 15.1 % (ref 11.5–15.5)
WBC: 5.3 10*3/uL (ref 4.0–10.5)

## 2010-08-20 LAB — COMPREHENSIVE METABOLIC PANEL
ALT: 22 U/L (ref 0–53)
AST: 18 U/L (ref 0–37)
Albumin: 3.9 g/dL (ref 3.5–5.2)
Alkaline Phosphatase: 101 U/L (ref 39–117)
BUN: 27 mg/dL — ABNORMAL HIGH (ref 6–23)
CO2: 25 mEq/L (ref 19–32)
Calcium: 9.5 mg/dL (ref 8.4–10.5)
Chloride: 100 mEq/L (ref 96–112)
Creatinine, Ser: 1.57 mg/dL — ABNORMAL HIGH (ref 0.4–1.5)
GFR calc Af Amer: 56 mL/min — ABNORMAL LOW (ref >60)
GFR calc non Af Amer: 46 mL/min — ABNORMAL LOW (ref >60)
Glucose, Bld: 479 mg/dL — ABNORMAL HIGH (ref 70–99)
Potassium: 4.9 mEq/L (ref 3.5–5.1)
Sodium: 132 mEq/L — ABNORMAL LOW (ref 135–145)
Total Bilirubin: 0.3 mg/dL (ref 0.3–1.2)
Total Protein: 7 g/dL (ref 6.0–8.3)

## 2010-08-20 LAB — GLUCOSE, CAPILLARY
Glucose-Capillary: 349 mg/dL — ABNORMAL HIGH (ref 70–99)
Glucose-Capillary: 418 mg/dL — ABNORMAL HIGH (ref 70–99)
Glucose-Capillary: 135 mg/dL — ABNORMAL HIGH (ref 70–99)

## 2010-08-20 LAB — POCT CARDIAC MARKERS
CKMB, poc: 5.6 ng/mL (ref 1.0–8.0)
Myoglobin, poc: 136 ng/mL (ref 12–200)
Troponin i, poc: <0.05 ng/mL (ref 0.00–0.09)

## 2010-08-20 LAB — LIPASE, BLOOD: Lipase: 20 U/L (ref 11–59)

## 2010-08-20 LAB — TROPONIN I: Troponin I: 0.01 ng/mL (ref 0.00–0.06)

## 2010-09-04 LAB — GLUCOSE, CAPILLARY
Glucose-Capillary: 148 mg/dL — ABNORMAL HIGH (ref 70–99)
Glucose-Capillary: 160 mg/dL — ABNORMAL HIGH (ref 70–99)
Glucose-Capillary: 173 mg/dL — ABNORMAL HIGH (ref 70–99)
Glucose-Capillary: 201 mg/dL — ABNORMAL HIGH (ref 70–99)
Glucose-Capillary: 257 mg/dL — ABNORMAL HIGH (ref 70–99)
Glucose-Capillary: 350 mg/dL — ABNORMAL HIGH (ref 70–99)

## 2010-09-04 LAB — URINALYSIS, ROUTINE W REFLEX MICROSCOPIC
Bilirubin Urine: NEGATIVE
Glucose, UA: NEGATIVE mg/dL
Hgb urine dipstick: NEGATIVE
Ketones, ur: NEGATIVE mg/dL
Leukocytes, UA: NEGATIVE
Nitrite: NEGATIVE
Protein, ur: 30 mg/dL — AB
Specific Gravity, Urine: 1.025 (ref 1.005–1.030)
Urobilinogen, UA: 0.2 mg/dL (ref 0.0–1.0)
pH: 5.5 (ref 5.0–8.0)

## 2010-09-04 LAB — DIFFERENTIAL
Basophils Absolute: 0 10*3/uL (ref 0.0–0.1)
Basophils Absolute: 0 10*3/uL (ref 0.0–0.1)
Basophils Relative: 0 % (ref 0–1)
Basophils Relative: 1 % (ref 0–1)
Eosinophils Absolute: 0.1 10*3/uL (ref 0.0–0.7)
Eosinophils Absolute: 0.2 10*3/uL (ref 0.0–0.7)
Eosinophils Relative: 4 % (ref 0–5)
Eosinophils Relative: 4 % (ref 0–5)
Lymphocytes Relative: 29 % (ref 12–46)
Lymphocytes Relative: 35 % (ref 12–46)
Lymphs Abs: 1.3 10*3/uL (ref 0.7–4.0)
Lymphs Abs: 1.5 10*3/uL (ref 0.7–4.0)
Monocytes Absolute: 0.3 10*3/uL (ref 0.1–1.0)
Monocytes Absolute: 0.3 10*3/uL (ref 0.1–1.0)
Monocytes Relative: 7 % (ref 3–12)
Monocytes Relative: 7 % (ref 3–12)
Neutro Abs: 2.3 10*3/uL (ref 1.7–7.7)
Neutro Abs: 2.6 10*3/uL (ref 1.7–7.7)
Neutrophils Relative %: 53 % (ref 43–77)
Neutrophils Relative %: 60 % (ref 43–77)

## 2010-09-04 LAB — CBC
HCT: 29.5 % — ABNORMAL LOW (ref 39.0–52.0)
HCT: 29.5 % — ABNORMAL LOW (ref 39.0–52.0)
Hemoglobin: 9.9 g/dL — ABNORMAL LOW (ref 13.0–17.0)
Hemoglobin: 9.9 g/dL — ABNORMAL LOW (ref 13.0–17.0)
MCHC: 33.7 g/dL (ref 30.0–36.0)
MCHC: 33.7 g/dL (ref 30.0–36.0)
MCV: 80.2 fL (ref 78.0–100.0)
MCV: 81 fL (ref 78.0–100.0)
Platelets: 194 10*3/uL (ref 150–400)
Platelets: 194 10*3/uL (ref 150–400)
RBC: 3.64 MIL/uL — ABNORMAL LOW (ref 4.22–5.81)
RBC: 3.68 MIL/uL — ABNORMAL LOW (ref 4.22–5.81)
RDW: 17.5 % — ABNORMAL HIGH (ref 11.5–15.5)
RDW: 17.9 % — ABNORMAL HIGH (ref 11.5–15.5)
WBC: 4.3 10*3/uL (ref 4.0–10.5)
WBC: 4.3 10*3/uL (ref 4.0–10.5)

## 2010-09-04 LAB — BASIC METABOLIC PANEL
BUN: 24 mg/dL — ABNORMAL HIGH (ref 6–23)
BUN: 27 mg/dL — ABNORMAL HIGH (ref 6–23)
CO2: 23 mEq/L (ref 19–32)
CO2: 25 mEq/L (ref 19–32)
Calcium: 8.9 mg/dL (ref 8.4–10.5)
Calcium: 9.3 mg/dL (ref 8.4–10.5)
Chloride: 106 mEq/L (ref 96–112)
Chloride: 108 mEq/L (ref 96–112)
Creatinine, Ser: 1.44 mg/dL (ref 0.4–1.5)
Creatinine, Ser: 1.46 mg/dL (ref 0.4–1.5)
GFR calc Af Amer: >60 mL/min (ref >60)
GFR calc Af Amer: >60 mL/min (ref >60)
GFR calc non Af Amer: 50 mL/min — ABNORMAL LOW (ref >60)
GFR calc non Af Amer: 51 mL/min — ABNORMAL LOW (ref >60)
Glucose, Bld: 268 mg/dL — ABNORMAL HIGH (ref 70–99)
Glucose, Bld: 304 mg/dL — ABNORMAL HIGH (ref 70–99)
Potassium: 4.4 mEq/L (ref 3.5–5.1)
Potassium: 4.4 mEq/L (ref 3.5–5.1)
Sodium: 137 mEq/L (ref 135–145)
Sodium: 138 mEq/L (ref 135–145)

## 2010-09-04 LAB — HEMOGLOBIN A1C
Hgb A1c MFr Bld: 14.5 % — ABNORMAL HIGH (ref 4.6–6.1)
Mean Plasma Glucose: 369 mg/dL

## 2010-09-04 LAB — POCT I-STAT 3, ART BLOOD GAS (G3+)
Acid-base deficit: 3 mmol/L — ABNORMAL HIGH (ref 0.0–2.0)
Bicarbonate: 21.4 mEq/L (ref 20.0–24.0)
O2 Saturation: 96 %
TCO2: 22 mmol/L (ref 0–100)
pCO2 arterial: 35.7 mmHg (ref 35.0–45.0)
pH, Arterial: 7.385 (ref 7.350–7.450)
pO2, Arterial: 84 mmHg (ref 80.0–100.0)

## 2010-09-04 LAB — POCT I-STAT 3, VENOUS BLOOD GAS (G3P V)
Acid-base deficit: 3 mmol/L — ABNORMAL HIGH (ref 0.0–2.0)
Acid-base deficit: 3 mmol/L — ABNORMAL HIGH (ref 0.0–2.0)
Bicarbonate: 22 mEq/L (ref 20.0–24.0)
Bicarbonate: 22.7 mEq/L (ref 20.0–24.0)
O2 Saturation: 69 %
O2 Saturation: 70 %
TCO2: 23 mmol/L (ref 0–100)
TCO2: 24 mmol/L (ref 0–100)
pCO2, Ven: 39.1 mmHg — ABNORMAL LOW (ref 45.0–50.0)
pCO2, Ven: 40.3 mmHg — ABNORMAL LOW (ref 45.0–50.0)
pH, Ven: 7.357 — ABNORMAL HIGH (ref 7.250–7.300)
pH, Ven: 7.36 — ABNORMAL HIGH (ref 7.250–7.300)
pO2, Ven: 37 mmHg (ref 30.0–45.0)
pO2, Ven: 38 mmHg (ref 30.0–45.0)

## 2010-09-04 LAB — CARDIAC PANEL(CRET KIN+CKTOT+MB+TROPI)
CK, MB: 4.4 ng/mL — ABNORMAL HIGH (ref 0.3–4.0)
CK, MB: 5.3 ng/mL — ABNORMAL HIGH (ref 0.3–4.0)
Relative Index: 2.7 — ABNORMAL HIGH (ref 0.0–2.5)
Relative Index: 2.9 — ABNORMAL HIGH (ref 0.0–2.5)
Total CK: 163 U/L (ref 7–232)
Total CK: 180 U/L (ref 7–232)
Troponin I: 0.02 ng/mL (ref 0.00–0.06)
Troponin I: 0.03 ng/mL (ref 0.00–0.06)

## 2010-09-04 LAB — LIPID PANEL
Cholesterol: 121 mg/dL (ref 0–200)
HDL: 67 mg/dL (ref >39)
LDL Cholesterol: 37 mg/dL (ref 0–99)
Total CHOL/HDL Ratio: 1.8 RATIO
Triglycerides: 86 mg/dL (ref <150)
VLDL: 17 mg/dL (ref 0–40)

## 2010-09-04 LAB — URINE MICROSCOPIC-ADD ON

## 2010-09-04 LAB — TROPONIN I: Troponin I: 0.02 ng/mL (ref 0.00–0.06)

## 2010-09-04 LAB — BRAIN NATRIURETIC PEPTIDE: Pro B Natriuretic peptide (BNP): 42 pg/mL (ref 0.0–100.0)

## 2010-09-04 LAB — TSH: TSH: 1.937 u[IU]/mL (ref 0.350–4.500)

## 2010-09-05 LAB — CBC
HCT: 31.6 % — ABNORMAL LOW (ref 39.0–52.0)
Hemoglobin: 10.5 g/dL — ABNORMAL LOW (ref 13.0–17.0)
MCHC: 33.2 g/dL (ref 30.0–36.0)
MCV: 81.4 fL (ref 78.0–100.0)
Platelets: 202 10*3/uL (ref 150–400)
RBC: 3.88 MIL/uL — ABNORMAL LOW (ref 4.22–5.81)
RDW: 18.2 % — ABNORMAL HIGH (ref 11.5–15.5)
WBC: 7.1 10*3/uL (ref 4.0–10.5)

## 2010-09-05 LAB — DIFFERENTIAL
Basophils Absolute: 0.1 10*3/uL (ref 0.0–0.1)
Basophils Relative: 1 % (ref 0–1)
Eosinophils Absolute: 0.2 10*3/uL (ref 0.0–0.7)
Eosinophils Relative: 3 % (ref 0–5)
Lymphocytes Relative: 31 % (ref 12–46)
Lymphs Abs: 2.2 10*3/uL (ref 0.7–4.0)
Monocytes Absolute: 0.4 10*3/uL (ref 0.1–1.0)
Monocytes Relative: 6 % (ref 3–12)
Neutro Abs: 4.2 10*3/uL (ref 1.7–7.7)
Neutrophils Relative %: 59 % (ref 43–77)

## 2010-09-05 LAB — D-DIMER, QUANTITATIVE: D-Dimer, Quant: 2.8 ug/mL-FEU — ABNORMAL HIGH (ref 0.00–0.48)

## 2010-09-05 LAB — BASIC METABOLIC PANEL
BUN: 26 mg/dL — ABNORMAL HIGH (ref 6–23)
CO2: 24 mEq/L (ref 19–32)
Calcium: 9.1 mg/dL (ref 8.4–10.5)
Chloride: 109 mEq/L (ref 96–112)
Creatinine, Ser: 1.31 mg/dL (ref 0.4–1.5)
GFR calc Af Amer: >60 mL/min (ref >60)
GFR calc non Af Amer: 57 mL/min — ABNORMAL LOW (ref >60)
Glucose, Bld: 213 mg/dL — ABNORMAL HIGH (ref 70–99)
Potassium: 4.9 mEq/L (ref 3.5–5.1)
Sodium: 137 mEq/L (ref 135–145)

## 2010-09-05 LAB — CK TOTAL AND CKMB (NOT AT ARMC)
CK, MB: 5.7 ng/mL — ABNORMAL HIGH (ref 0.3–4.0)
Relative Index: 2.9 — ABNORMAL HIGH (ref 0.0–2.5)
Total CK: 199 U/L (ref 7–232)

## 2010-09-05 LAB — TROPONIN I: Troponin I: 0.01 ng/mL (ref 0.00–0.06)

## 2010-09-05 LAB — APTT: aPTT: 25 seconds (ref 24–37)

## 2010-09-05 LAB — PROTIME-INR
INR: 1 (ref 0.00–1.49)
Prothrombin Time: 13.3 seconds (ref 11.6–15.2)

## 2010-09-05 LAB — GLUCOSE, CAPILLARY: Glucose-Capillary: 98 mg/dL (ref 70–99)

## 2010-09-06 LAB — GLUCOSE, CAPILLARY: Glucose-Capillary: 285 mg/dL — ABNORMAL HIGH (ref 70–99)

## 2010-09-08 LAB — GLUCOSE, CAPILLARY
Glucose-Capillary: 369 mg/dL — ABNORMAL HIGH (ref 70–99)
Glucose-Capillary: 422 mg/dL — ABNORMAL HIGH (ref 70–99)

## 2010-09-15 LAB — GLUCOSE, CAPILLARY
Glucose-Capillary: 298 mg/dL — ABNORMAL HIGH (ref 70–99)
Glucose-Capillary: 309 mg/dL — ABNORMAL HIGH (ref 70–99)
Glucose-Capillary: 353 mg/dL — ABNORMAL HIGH (ref 70–99)

## 2010-09-16 ENCOUNTER — Other Ambulatory Visit: Payer: Self-pay | Admitting: Medical

## 2010-09-16 ENCOUNTER — Other Ambulatory Visit: Payer: Self-pay | Admitting: Oncology

## 2010-09-16 ENCOUNTER — Encounter (HOSPITAL_BASED_OUTPATIENT_CLINIC_OR_DEPARTMENT_OTHER): Payer: Medicare Other | Admitting: Oncology

## 2010-09-16 DIAGNOSIS — G8929 Other chronic pain: Secondary | ICD-10-CM

## 2010-09-16 DIAGNOSIS — D631 Anemia in chronic kidney disease: Secondary | ICD-10-CM

## 2010-09-16 DIAGNOSIS — R109 Unspecified abdominal pain: Secondary | ICD-10-CM

## 2010-09-16 DIAGNOSIS — N189 Chronic kidney disease, unspecified: Secondary | ICD-10-CM

## 2010-09-16 DIAGNOSIS — C8293 Follicular lymphoma, unspecified, intra-abdominal lymph nodes: Secondary | ICD-10-CM

## 2010-09-16 DIAGNOSIS — Z7901 Long term (current) use of anticoagulants: Secondary | ICD-10-CM

## 2010-09-16 DIAGNOSIS — I82409 Acute embolism and thrombosis of unspecified deep veins of unspecified lower extremity: Secondary | ICD-10-CM

## 2010-09-16 DIAGNOSIS — C8590 Non-Hodgkin lymphoma, unspecified, unspecified site: Secondary | ICD-10-CM

## 2010-09-16 LAB — COMPREHENSIVE METABOLIC PANEL
ALT: 17 U/L (ref 0–53)
AST: 20 U/L (ref 0–37)
Albumin: 3.7 g/dL (ref 3.5–5.2)
Alkaline Phosphatase: 87 U/L (ref 39–117)
BUN: 31 mg/dL — ABNORMAL HIGH (ref 6–23)
CO2: 23 mEq/L (ref 19–32)
Calcium: 8.9 mg/dL (ref 8.4–10.5)
Chloride: 106 mEq/L (ref 96–112)
Creatinine, Ser: 2.05 mg/dL — ABNORMAL HIGH (ref 0.40–1.50)
Glucose, Bld: 67 mg/dL — ABNORMAL LOW (ref 70–99)
Potassium: 3.8 mEq/L (ref 3.5–5.3)
Sodium: 140 mEq/L (ref 135–145)
Total Bilirubin: 0.2 mg/dL — ABNORMAL LOW (ref 0.3–1.2)
Total Protein: 6.3 g/dL (ref 6.0–8.3)

## 2010-09-16 LAB — CBC WITH DIFFERENTIAL/PLATELET
BASO%: 0.2 % (ref 0.0–2.0)
Basophils Absolute: 0 10*3/uL (ref 0.0–0.1)
EOS%: 2.2 % (ref 0.0–7.0)
Eosinophils Absolute: 0.1 10*3/uL (ref 0.0–0.5)
HCT: 30.9 % — ABNORMAL LOW (ref 38.4–49.9)
HGB: 9.9 g/dL — ABNORMAL LOW (ref 13.0–17.1)
LYMPH%: 29 % (ref 14.0–49.0)
MCH: 25 pg — ABNORMAL LOW (ref 27.2–33.4)
MCHC: 32 g/dL (ref 32.0–36.0)
MCV: 78 fL — ABNORMAL LOW (ref 79.3–98.0)
MONO#: 0.6 10*3/uL (ref 0.1–0.9)
MONO%: 9.1 % (ref 0.0–14.0)
NEUT#: 3.7 10*3/uL (ref 1.5–6.5)
NEUT%: 59.5 % (ref 39.0–75.0)
Platelets: 249 10*3/uL (ref 140–400)
RBC: 3.96 10*6/uL — ABNORMAL LOW (ref 4.20–5.82)
RDW: 16.9 % — ABNORMAL HIGH (ref 11.0–14.6)
WBC: 6.3 10*3/uL (ref 4.0–10.3)
lymph#: 1.8 10*3/uL (ref 0.9–3.3)

## 2010-09-16 LAB — GLUCOSE, CAPILLARY: Glucose-Capillary: 177 mg/dL — ABNORMAL HIGH (ref 70–99)

## 2010-10-14 NOTE — Op Note (Signed)
[name], [name] NO.:  [id number]   MEDICAL RECORD NO.:  [id number]          PATIENT TYPE:  OUT   LOCATION:  [facility]                         FACILITY:  [facility]   PHYSICIAN:  Firas N. Shadad        DATE OF BIRTH:  1951/05/05   DATE OF PROCEDURE:  11/28/2007  DATE OF DISCHARGE:                               OPERATIVE REPORT   PROCEDURE:  Bone marrow aspirate and biopsy.   INDICATIONS:  [name] is a 60 year old gentleman, recent diagnosis  with diffuse large B-cell lymphoma. A bone marrow biopsy is for staging  purposes.   DESCRIPTION OF PROCEDURE:  The patient was brought into short stay at  [facility].  IV access was obtained for the administration of  conscious sedation.  A time-out was called to identify the patient, the  procedure and the performing physician was done prior to the procedure.  The patient was also classified on the American Society of  Anesthesiology scale as class II.  The Mallampati class was also  identified as class II.  The patient was placed in the decubitus  position exposing his left iliac crest.  The skin was prepped and draped  in the sterile fashion.  Conscious sedation was obtained, obtaining 5 mg  of Versed and 25 mg of Demerol.  Skin, periosteum and subcutaneous  tissue was anesthetized utilizing 2% lidocaine.  Using scalpel, a small  incision was made.  Using the Jamshidi needle, aspirate was obtained  without any difficulty.  Specimen was sent for flow and cytogenetics.  Also using the Jamshidi needle, biopsy was obtained without any  difficulty.  The patient tolerated the procedure well.  Bleeding was  minimal.  A pressure dressing was obtained, and instructions to lay flat  for the next 45 minutes was given to the patient.  This was discussed  with his son, who had accompanied him today.  His followup will be on  Thursday, December 01, 2007 for discussing of the results.           ______________________________  Mathis Dad [facility]  Electronically Signed     FNS/MEDQ  D:  11/28/2007  T:  11/28/2007  Job:  [id number]

## 2010-10-14 NOTE — Op Note (Signed)
[name], [name]                 ACCOUNT NO.:  [id number]   MEDICAL RECORD NO.:  [id number]          PATIENT TYPE:  AMB   LOCATION:  [place]                          FACILITY:  [facility]   PHYSICIAN:  Thomas A. Cornett, M.D.DATE OF BIRTH:  03/18/1951   DATE OF PROCEDURE:  11/24/2007  DATE OF DISCHARGE:                               OPERATIVE REPORT   PREOPERATIVE DIAGNOSIS:  History of intra-abdominal lymphoma and need of  chemotherapy.   POSTOPERATIVE DIAGNOSIS:  History of intra-abdominal lymphoma and need  of chemotherapy.   PROCEDURE:  Placement of right subclavian 8-French Power Port-A-Cath  with fluoroscopy.   SURGEON:  Marcello Moores A. Cornett, MD.   ASSISTANT:  OR staff.   ANESTHESIA:  MAC with 0.25% Sensorcaine with epinephrine local.   ESTIMATED BLOOD LOSS:  30 mL.   SPECIMENS:  None.   INDICATIONS FOR PROCEDURE:  The patient is a 60 year old male recently  diagnosed with intra-abdominal lymphoma.  He is in need of chemotherapy  for treatment.  He presents today for Port-A-Cath placement and to get  chemotherapy for lymphoma treatment.  Risk of procedure were discussed  in the holding area to include bleeding, infection, pneumothorax,  hemothorax, cardiac tamponade bleeding as well as infection Port-A-Cath  tip migration and subsequent need for removal.  He understands the above  and agreed to proceed.   DESCRIPTION OF PROCEDURE:  The patient was brought to the operating room  and placed supine.  Right arm was tucked and left arm was left on the  arm board.  After induction of MAC anesthesia, the upper chest and neck  regions were prepped and draped in a sterile fashion.  A catheter was  brought on the field and flushed.  The patient was placed in  Trendelenburg and the right subclavian vein was cannulated.  Wire was  fed through this with the return of dark nonpulsatile blood.  Fluoroscopy initially showed the wire to go across into the innominate  vein, but under  fluoroscopic guidance.  I was able to pull the wire back  and advance it down into the superior vena cava.  Once this was done,  the patient was flattened out.  Small stab incision was made into the  wire exit site.  Below this, a 3-cm incision was made for placement of  the port itself.  Dissection was carried down to the pectoralis major  fascia and a small pocket was made bluntly with my finger.  Hemostasis  was excellent.  An 8-French Power Port-A-Cath was brought in the field  and this was attached together on the back table.  I flushed this out at  that point with heparinized saline.  We then placed the port in the  incision and used a tunneling device to pull the catheter out where the  exit wire site was located.  We then cut the catheter to about 16 cm  from the skin edge.  We then put the patient back into Trendelenburg.  I  then advanced a dilator over the wire moving the wire to-and-fro with no  resistance.  I then  placed a dilator introducer complex over the wire  and advanced this while moving the wire to-and-fro and felt no  resistance.  At this point in time, I then put the catheter and removed  the wire and dilator, put the catheter and peel-away sheath pealed away.  Fluoroscopy showed the tip to be and what appeared to be the mid  superior vena cava.  I then drew back on the port, got good blood  return.  The port was then flushed with heparinized saline.  I then  placed a 5 mL of 100 U/mL of heparinized saline in the port itself.  I  secured the port into the chest wall using 2-0 Prolene x2.  The wounds  were closed in layers.  The deep layer of 3-0 Vicryl and a subsequent  layer of 4-0 Monocryl.  Dermabond was applied.  All final counts of  sponge, needle, and instrument were found to be correct at this portion  of the case.  The tip of the catheter was located by fluoroscopic means  in the superior vena cava and is ready for use.  The patient will get a  postoperative  chest x-ray.      Thomas A. Cornett, [name]  Electronically Signed     TAC/MEDQ  D:  11/24/2007  T:  11/25/2007  Job:  [id number]   cc:   Burnard Bunting, [name]

## 2010-10-14 NOTE — H&P (Signed)
[name], [name] NO.:  [id number]   MEDICAL RECORD NO.:  [id number]          PATIENT TYPE:  INP   LOCATION:  5127                         FACILITY:  [facility]   PHYSICIAN:  Joyice Faster. Cornett, M.D.DATE OF BIRTH:  31-Aug-1950   DATE OF ADMISSION:  10/27/2007  DATE OF DISCHARGE:                              HISTORY & PHYSICAL   PRIMARY CARE PHYSICIAN:  Burnard Bunting, MD   CHIEF COMPLAINT:  Mesenteric mass resulting in left mid quadrant  abdominal pain.   HISTORY OF PRESENT ILLNESS:  [name] began having left mid abdominal  and left lower quadrant pain several weeks ago.  Because of this pain,  he presented to the hospital and was admitted on Oct 08, 2007, after  finding a mesenteric mass on CT scan.  At this time, he was treated with  antibiotics, and a percutaneous biopsy was performed during his  hospitalization, which revealed abnormal lymphoid tissue.  Currently, at  that time the patient was not having any nausea, vomiting, or diarrhea,  just the left mid abdominal pain.  After the patient had his biopsy and  was treated with antibiotics, he was then discharged home.  The patient  at that time followed up with his primary care physician, who was not  comfortable with the results of the biopsy and wanted a more definitive  pathology.  Therefore, at that time, he spoke with Dr. Brantley Stage, and it  was arranged for the patient to be admitted today to the hospital for  another mesenteric mass biopsy.  Since the patient has been home, he has  began to have some nausea.  He states that he did have one bout of  emesis yesterday.  However, since then, he has not any further episodes.  He has also complained of some shortness of breath with some activity at  home over the past week.  He states this is not something that he has  ever had before.  Otherwise, at this time, he will be admitted for a  more thorough mesenteric mass biopsy via surgical intervention.   REVIEW OF SYSTEMS:  See HPI.  Otherwise, all other systems are negative.   FAMILY HISTORY:  Noncontributory.   PAST MEDICAL HISTORY:  1. History of atypical chest pain with negative stress test by the      patient's report.  2. Type 2 diabetes.  3. Hypertension.  4. Hyperlipidemia.  5. Obesity.  6. Left toe ulceration in early last year.  He is status post      orthotics and debridement and history of right lower extremity DVT.   PAST SURGICAL HISTORY:  Left toe debridement.   SOCIAL HISTORY:  The patient denies tobacco and alcohol.  He drives a  truck locally.  He has one son.  He is currently not married.   ALLERGIES:  NKDA.   MEDICATIONS:  1. Glucophage 500 mg b.i.d.  2. Novolin 70/30, 75 units in the morning and 55 units at night.  3. [facility] baby aspirin 81 mg a day.  4. He is on a blood pressure pills  as well as a cholesterol pill      apparently; however, he does not know what these are; however, on      the most recent history and physical, they noted last year's      medication list included Lotrel, Actos, and lisinopril.   PHYSICAL EXAM:  GENERAL:  This is a pleasant, well-developed, well-  nourished 60 year old black male, who is sitting in his chair in no  acute distress.  VITAL SIGNS:  At this point have not been taken.  HEENT:  Eyes, sclerae are noninjected.  Pupils are equal, round, and  reactive to light.  Ears, nose, mouth, and throat, ears and nose show no  obvious masses or lesions, no rhinorrhea.  Mouth is pink and moist.  Throat shows no exudate.  NECK:  Supple.  No thyromegaly.  Trachea is midline.  HEART:  Regular rate and rhythm.  Normal S1 and S2.  No murmurs,  gallops, or rubs are noted.  A +2 carotid and pedal pulses bilaterally.  LUNGS:  Clear to auscultation bilaterally with no wheezes, rhonchi, or  rales noted.  Respiratory effort is nonlabored.  CHEST:  Symmetrical.  ABDOMEN:  Soft, tender, just left of the umbilicus.  He is obese and  has  active bowel sounds.  At this time, he has no guarding, rebounding, or  any other scars or hernias noted.  MUSCULOSKELETAL:  All 4 extremities are symmetrical with no cyanosis,  clubbing, or edema noted.  SKIN:  Warm and dry with no obvious masses, lesions, or rashes.  NEURO:  The patient's cranial nerves II through XII appear to be grossly  intact.  PSYCHE:  The patient is alert and oriented x3 with an appropriate  affect.   LABS AND DIAGNOSTICS:  Labs are currently pending, and a chest x-ray is  currently pending.   IMPRESSION:  1. Mesenteric mass.  2. Diabetes.  3. Hypertension.  4. Obesity.   PLAN:  At this time, we will admit the patient for a laparoscopy,  possible laparotomy for mesenteric mass biopsy tomorrow.  At this time,  we will allow him to have clear liquids and then making him n.p.o. after  midnight.  In the meantime, we will also give him some of his  medications by mouth today and then change this over to IV tomorrow.  Otherwise, he will be put on sliding scale as well as given his  Glucophage today to help control his diabetes.  Otherwise, he is given  various other pain and nausea medications on a p.r.n. basis.  Also at  this time, we will order some labs as well as obtain a new chest x-ray,  since the patient has had some new-onset shortness of breath within  the past week or so.  Otherwise, the patient understands the procedure  that is going to be done and is in agreeance to having this done.  Otherwise, any further recommendations or notes will be made by Dr.  Brantley Stage after his evaluation of the patient.      John Cea, PA      Marcello Moores A. Cornett, [name]  Electronically Signed    KEO/MEDQ  D:  10/27/2007  T:  10/28/2007  Job:  [id number]   cc:   Burnard Bunting, [name]

## 2010-10-14 NOTE — H&P (Signed)
NAMEDEWITT, COSSETTE NO.:  [id number]   MEDICAL RECORD NO.:  [id number]          PATIENT TYPE:  INP   LOCATION:  [id number]                         FACILITY:  [facility]   PHYSICIAN:  [name], NP         DATE OF BIRTH:  1951/05/29   DATE OF ADMISSION:  03/24/2008  DATE OF DISCHARGE:                              HISTORY & PHYSICAL   ADMITTING DIAGNOSIS:  Nausea and vomiting.   PRIMARY DIAGNOSIS:  Diffuse large-cell non-Hodgkin's lymphoma.   HISTORY OF PRESENT ILLNESS:  Mr. Doolin is an African American gentleman,  who has a history of diffuse large-cell non-Hodgkin's lymphoma.  He  originally presented in May of 2009 with abdominal pain.  A CT scan of  the abdomen and pelvis showed a mesenteric mass around the aortic  bifurcation, measuring 4.1 X 3.1 cm.  He underwent a CT-guided biopsy on  Oct 12, 2007.  Unfortunately, the pathology was nondiagnostic.  He  underwent a laparoscopic-assisted lymph node biopsy Oct 28, 2007.  Pathology showed a diffuse large-cell non-Hodgkin's lymphoma with  immunohistochemical stain positive for CD79A and CD20.  Based on these  findings, Mr. Mccrudden was referred to Dr. Zola Button for further  evaluation and treatment.  Mr. Plett underwent staging with a PET CT  scan and bone marrow biopsy and was found to have Stage IIA non-  Hodgkin's lymphoma.  He proceeded to receive treatment with Rituxan,  cyclophosphamide, vincristine, Adriamycin and prednisone after a Port-A-  Cath was placed.  He has completed six cycles of therapy, receiving his  most recent treatment March 19, 2008.  He was readmitted after cycle 4  with nausea, vomiting and dehydration.   PAST MEDICAL HISTORY:  Hypertension, hyperlipidemia, diabetes,  retinopathy, left-toe ulceration that is status post debridement.   ALLERGIES:  None.   CURRENT MEDICATIONS:  1. Actos 35 mg daily.  2. Lotrel 5/20 one daily.  3. Centrum Multivitamin one daily.  4. Aspirin 81 mg daily.  5. __________ 5/325 every four hours as needed.  6. NovoLog 70/30 75 units in the morning, 55 units in the evening.  7. Compazine 10 mg orally every 6 hours as needed.  8. Ativan 1 mg at bedtime as needed.  9. Ambien-CR 12.5 mg at bedtime.  10.Oxycodone 5 mg every 4 hours as needed.   FAMILY HISTORY:  Unremarkable for any malignant disorders.  There is no  family history of diabetes or coronary artery disease.   SOCIAL HISTORY:  Mr. Rawling is single.  He has one son.  He currently  works as a Administrator.  He denies alcohol or tobacco use.   PHYSICAL EXAMINATION:  VITAL SIGNS:  Temperature 97.5, pulse 105, blood  pressure 155/81.  IN GENERAL:  Mr. Shotts is ill-appearing.  He is vomiting.  HEENT:  Pupils were equal and reactive to light and accommodation.  Extraocular movements are intact.  Oropharynx shows erythema on the soft  palate.  There are no lesions apparent and no exudate.  NECK:  Supple without palpable adenopathy or thyromegaly.  LUNGS:  Clear to auscultation  bilaterally.  CARDIAC EXAM:  Regular rate and rhythm without murmurs, rubs or gallops.  ABDOMEN:  Soft, bowel sounds times all four quadrants.  There is no  palpable hepatosplenomegaly.  MUSCULOSKELETAL EXAM:  Reveals no tenderness to spine or ribs.  EXTREMITIES:  Reveal no edema.  NEUROLOGIC EXAM:  Without focal deficits.   LABORATORY DATA:  WBC 4, hemoglobin 10, hematocrit 29.9, platelets  286,000, neutrophils 89.4%.   IMPRESSION AND PLAN:  This is a 60 year old gentleman with diffuse large-  cell lymphoma, which was diagnosed in June 2009, presenting as Stage  IIA.  He is currently cycle number 6 of RCHOP, which he completed  March 19, 2008.   1. Nausea and vomiting:  We will admit Mr. Brau to the inpatient unit      for hydration and antiemetics as needed, which will be given      intravenously.  2. Diabetes:  We will continue Actos and monitor blood glucose using      sliding scale insulin for  coverage.  3. Hypertension:  Continue on Lotrel.  4. History of anemia:  Aranesp was initiated March 02, 2008, and      receiving every two weeks as needed.  Today's hemoglobin 10 g/dL.           ______________________________  [name], NP     ML/MEDQ  D:  03/24/2008  T:  03/24/2008  Job:  [id number]

## 2010-10-14 NOTE — Discharge Summary (Signed)
[name], [name] NO.:  [id number]   MEDICAL RECORD NO.:  [id number]          PATIENT TYPE:  INP   LOCATION:  5506                         FACILITY:  [facility]   PHYSICIAN:  Burnard Bunting, [name]  DATE OF BIRTH:  1951-02-16   DATE OF ADMISSION:  10/08/2007  DATE OF DISCHARGE:  10/14/2007                               DISCHARGE SUMMARY   DIAGNOSES AT THE TIME OF DISCHARGE.:  1. Mesenteric mass, unclear infectious adenitis versus malignancy.  2. Diabetes mellitus type 2, poor control.  3. Essential hypertension.  4. Hyperlipidemia.   HISTORY OF PRESENT ILLNESS:  Mr. Everage is a 60 year old gentleman with  diabetes mellitus type 2, on oral agents and insulin, hyperlipidemia,  and hypertension who presented with abdominal pain and poor p.o. intake.  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  He presents with 24  hours of progressive abdominal pain, mainly central radiating to the  left lower quadrant.  He was seen in the emergency room, treated with  Zofran, Dilaudid.  He had a CT scan that demonstrated fairly significant  mesenteric mass; he was admitted for evaluation and treatment..  For  details see the dictated summary on the chart by my partner Dr. Virgina Jock.   LABORATORY DATA:  Serum protein electrophoresis was nonspecific.  CBC:  Hemoglobin 10.4, hematocrit 32, white blood cell count 9.5, platelet  count 324, 000, PT was 14.3, PTT 38, D-dimer 0.39.  Chemistry: sodium  138, potassium 4.9, chloride 103, CO2 is 22, BUN 24, creatinine 1.64,  and glucose 196.  Repeat creatinine was 1.4.  Calcium was 8.7.  CKs 202  and 213 respectively.  Iron is 12, B12 500, ferritin was 78.  CEA was  1.1.  PSA was 0.32.  CA 19-9 was low at 4.5.  Urinalysis was negative.  CT scan revealed a large mesenteric mass.  Biopsy of the CT  scan was  done on Oct 12, 2007, pathology pending.   HOSPITAL COURSE:  The patient was admitted, placed on IV fluids,  provided Zofran and Dilaudid for pain relief.  He was treated  empirically with IV antibiotics specifically Unasyn once.  Surgery was  consulted with the feeling this was either a malignancy or possibly an  infectious adenitis.  He actually improved fairly dramatically, over 24-  48 hours became pain free, leading [place] to suspect more of a nonmalignant  process.  Percutaneous biopsy was performed, pathology pending, without  complications, and he was discharged in improved but stable condition on  oral antibiotics for further decompensation as an outpatient.  Depending  upon outpatient followup and improvement or worsening of the mass as  well as pathologic findings, further decisions can be made versus to  include continuation of antibiotics and serial scans versus open biopsy  if needed.   The patient is discharged in stable condition.  Resumption of  medications, specifically Lotrel 5/20 daily, Actos 45 mg daily, Humalog  70/30 42  units breakfast, 30 units lunch, metformin 500 two p.o. b.i.d.,  Lexapro 10 mg daily,  multivitamin one daily, aspirin 81 mg daily.  Iron 325 daily and  Augmentin 875 b.i.d. x 10 more days.  He will follow up with Dr. Reynaldo Minium  in 7-10 days and again decisions regarding open biopsy versus serial  scans/continuation of antibiotics will be made at that time.           ______________________________  Burnard Bunting, [name]     RA/MEDQ  D:  11/09/2007  T:  11/09/2007  Job:  [id number]

## 2010-10-14 NOTE — Op Note (Signed)
[name], [name] NO.:  [id number]   MEDICAL RECORD NO.:  [id number]          PATIENT TYPE:  INP   LOCATION:  5122                         FACILITY:  [facility]   PHYSICIAN:  Joyice Faster. Cornett, M.D.DATE OF BIRTH:  16-Sep-1950   DATE OF PROCEDURE:  DATE OF DISCHARGE:                               OPERATIVE REPORT   PREOPERATIVE DIAGNOSIS:  Retroperitoneal mass with abdominal pain.   POSTOPERATIVE DIAGNOSIS:  Retroperitoneal lymphadenopathy with  mesenteric lymphadenopathy.   PROCEDURE:  Laparoscopic retroperitoneal lymph node biopsy.   SURGEON:  Marcello Moores A. Cornett, [name]   ANESTHESIA:  General endotracheal anesthesia with 0.25% Sensorcaine  local.   ESTIMATED BLOOD LOSS:  20 mL.   SPECIMEN:  Periaortic retroperitoneal lymph node to pathology.   DRAINS:  None.   INDICATIONS FOR PROCEDURE:  The patient is a 60 year old male who was  admitted to the hospital about 2 weeks ago with an abdominal mass and  abdominal pain.  A large retroperitoneal mass was encountered.  This was  felt to be inflammatory, treated with antibiotics.  He did not improve  and saw his primary care doctor, Dr. Reynaldo Minium.  He was referred to [facility] for consideration of an either open laparoscopic  retroperitoneal mass biopsy, since he had a percutaneous biopsy that was  not helpful and only showed atypical cells.  He presents today for this.   DESCRIPTION OF PROCEDURE:  After receiving informed consent, the patient  was brought to the operating room and placed supine.  After induction of  general anesthesia, Foley catheter was placed and the abdomen and was  prepped and draped in a sterile fashion.  A 1-cm supraumbilical incision  was made and this was carried down to his fascia.  His fascia was opened  for approximately a centimeter to centimeter and a half, had a  pursestring suture, 0-Vicryl was placed around this fascia.  A 12-mm  Hassan cannula was placed under direct vision.   Pneumoperitoneum was  created to 15 mmHg of CO2 and laparoscope was placed.  After placement  of the laparoscope, pneumoperitoneum was created a 15 mmHg of CO2.  We  then placed total of 3 to 5-mm ports, one on the left lower quadrant,  the second in the right lower quadrant, and third in the midline between  the umbilicus and pubic symphysis.  We then put the patient  Trendelenburg and rolled into his right.  We were able to pull a small  bowel back until we found over the mesentery and the bifurcation of the  aorta where the mass was located.  Pontification appeared to be  lymphadenopathy and looked very suspicious for lymphoma.  I was able to  identify a fairly large lymph node and using shears, I was able to  excise, the majority of this lymph node from the mass.  We had a  significant amount of tissue measuring total of about a centimeter to a  centimeter and a half, it looked like of rubbery white firm tissue which  would be consistent with lymphoma.  I felt the specimen was  adequate, I  did not want to do a laparotomy for any more tissue since we got quite a  nice amount of tissue with the biopsy laparoscopically.  I use cautery  to buzz the oozing from the biopsy site.  No small bowel was injured.  I  reexamined the abdominal cavity.  There were some mesentery stuck to  some small bowel, which looked like lymphoma as well involving the small  bowel, which will be somewhere proximal to jejunum that looked like.  He  was not obstructed.  The colon looked normal otherwise.  Gallbladder,  stomach, and liver all looked grossly normal.  No signs of ascites or  peritoneal seeding noted.  At this point in time, I felt the biopsy was  adequate.  I allowed the CO2 to escape after suctioning out any excess  old blood or serous fluid.  We pulled the ports out, and the port sites  showed no signs of bleeding.  I pulled the umbilical port site out as  well.  At this point in time, I passed the  instruments off the field.  I  closed the incisions at this point with a pursestring suture of 0-Vicryl  for the umbilical port fascia and 4-0 Monocryl for the skin.  Dermabond  was applied to all incisions.  All final counts of sponge, needle, and  instruments were found to be correct at this portion of the case.  The  patient was then awoke, taken to recovery in satisfactory condition.  The tissue was sent to pathology for further evaluation.  All final  counts of sponge, needle, and instruments found be correct at this  portion of the case.      Thomas A. Cornett, [name]  Electronically Signed     TAC/MEDQ  D:  10/28/2007  T:  10/29/2007  Job:  [id number]   cc:   Burnard Bunting, [name]

## 2010-10-14 NOTE — Discharge Summary (Signed)
NAMEEDLEY, OH NO.:  [id number]   MEDICAL RECORD NO.:  [id number]          PATIENT TYPE:  INP   LOCATION:  5122                         FACILITY:  [facility]   PHYSICIAN:  Joyice Faster. Cornett, M.D.DATE OF BIRTH:  1950/12/19   DATE OF ADMISSION:  10/27/2007  DATE OF DISCHARGE:  10/29/2007                               DISCHARGE SUMMARY   ADMITTING DIAGNOSES:  1. Retroperitoneal mass.  2. Type 2 diabetes mellitus.   DISCHARGE DIAGNOSES:  1. Retroperitoneal mass.  2. Type 2 diabetes mellitus.   PROCEDURES PERFORMED:  Laparoscopic retroperitoneal lymph node biopsy.   BRIEF HISTORY:  The patient is a 60 year old male with a retroperitoneal  mass.  He was admitted for biopsy of this and a core biopsy done  percutaneously was inconclusive.  He was admitted on Oct 27, 2007.   HOSPITAL COURSE:  The patient underwent laparoscopic biopsy of  retroperitoneal lymph node on Oct 28, 2007 without problem.  Postop  course is unremarkable.  He was discharged home on postop day #1 in  satisfactory condition.  He had no fever.  Abdominal pain was minimal.  Wounds were clean, dry, and intact.  The abdomen was soft and nontender.  He is tolerating a diet.   DISCHARGE INSTRUCTIONS:  He will follow up with Dr. Reynaldo Minium, his primary  care doctor.  This will be done at his discretion.  He will come back  and see me in 3-4 weeks.  Hopefully, the results will be back in a few  days and we can decide further care and treatment at that point in time.   DISCHARGE MEDICATIONS:  1. Vicodin 1-2 tabs q. 4 p.r.n. pain.  2. Metformin 500 mg p.o. b.i.d.   He will refrain from lifting and driving.  I will see him back in 3-4  weeks.   CONDITION ON DISCHARGE:  Improved.      Thomas A. Cornett, [name]  Electronically Signed    TAC/MEDQ  D:  10/29/2007  T:  10/29/2007  Job:  [id number]   cc:   Burnard Bunting, [name]

## 2010-10-14 NOTE — Discharge Summary (Signed)
[name], [name]                 ACCOUNT NO.:  [id number]   MEDICAL RECORD NO.:  [id number]          PATIENT TYPE:  INP   LOCATION:  1301                         FACILITY:  [facility]   PHYSICIAN:  Mathis Dad. Shadad        DATE OF BIRTH:  August 13, 1950   DATE OF ADMISSION:  02/10/2008  DATE OF DISCHARGE:  02/13/2008                               DISCHARGE SUMMARY   ADMISSION DIAGNOSES:  1. Non-Hodgkin's lymphoma status post CHOP with rituximab      chemotherapy, the last of which was given on February 07, 2008.  2. Nausea and vomiting due to chemotherapy.  3. Dehydration.  4. Hyperglycemia.   DISCHARGE DIAGNOSES:  1. Nausea and vomiting, resolving.  2. Dehydration, resolving.  3. Diabetes mellitus.  4. Hypertension.  5. Non-Hodgkin's lymphoma.   HISTORY OF PRESENT ILLNESS:  Mr. 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  For details of the diagnosis and the  staging, see the dictated history and physical on February 10, 2008.  The patient is currently under treatment at the [facility]  for his lymphoma.  He is status post four cycles of chemotherapy and  presented on Friday, February 10, 2008, with complaints of nausea and  vomiting and dehydration and was admitted for controlling of his nausea,  as well as for rehydration and treating his hyperglycemia.   HOSPITAL COURSE:  1. The patient recovered rather quickly.  He was started on IV fluids.      He was placed on around-the-clock Zofran and was also placed on      insulin, a sliding scale.  Again, he turned around rather quickly.      His nausea and vomiting was resolved on February 12, 2008.  His IV      fluids were discontinued.  He was placed on p.o. medication between      February 12, 2008 and February 13, 2008, which  he was able to      tolerate without any major problems, and from that standpoint,      given the fact  that he was tolerating p.o., was not requiring any      IV pain medication, was not requiring any IV nausea medications,      the patient felt that he was ready to be discharged.  2. From a diabetes mellitus standpoint, he was continued on NovoLog      insulin 7030 per his outpatient regimen at 75 units in the morning,      55 units in the evening.  He was also placed on insulin sliding      scale.  His blood sugars ranged between 70 and 300, and the reason      for that is that due to his steroids that he is taking as a part of      the CHOP regimen, which he has completed successfully at this time.  3. From a hypertension standpoint, his blood pressure remained      adequate and  under excellent control with blood pressure ranging      between 145 to 132.  4. Chronic renal insufficiency.  His creatinines have ranged between      1.64 and 1.42 during his hospitalization.  5. Iron deficiency anemia and has been status post iron replacement.      He is currently on p.o. iron as needed.   DISCHARGE INSTRUCTIONS:  He will be discharged today in good condition.  Follow up at the [facility] on February 17, 2008 for  laboratory data, as well as with Dr. Alen Blew the following week in  anticipation for his fifth and sixth cycles of chemotherapy which would  be his last.   DISCHARGE MEDICATIONS:  1. Actos 30 mg p.o. daily.  2. Lotrel 5/20 p.o. daily.  3. NovoLog 70/30, 75 units in the morning, 55 units at night.  4. Protonix 40 mg daily.  5. Zofran 8 mg p.o. q.8h. p.r.n.  6. Oxycodone 5 mg 1-2 tablets p.o. q.4h. p.r.n.   He also was to contact Dr. Reynaldo Minium, his primary care doctor for recheck  and address his fluctuating blood sugars.  Again anticipate this is  transient in nature due to his short course of steroids.   DISCHARGE INSTRUCTIONS:  He has no other restrictions.  Diabetic diet.   CONDITION:  Improved.   PHYSICAL EXAMINATION ON DISCHARGE:  GENERAL:  He was alert,  awake, not  in any distress.  VITAL SIGNS:  Blood pressure is 15080, pulse is 93, respirations 18.  He  is afebrile.  HEENT:  Head is normocephalic and atraumatic.  Pupils are equal, round  and reactive to light.  The oral mucosa is moist and pink.  NECK:  Supple.  HEART:  Regular rate, S1-S2.  LUNGS:  Clear.  ABDOMEN:  Soft.  EXTREMITIES:  No edema.   LABORATORY DATA:  Hemoglobin of 9.1, platelet count of 183, white cell  count of 9.9, creatinine of 1.47, potassium 4.6, sodium of 138.           ______________________________  Mathis Dad. [facility]  Electronically Signed     FNS/MEDQ  D:  02/13/2008  T:  02/13/2008  Job:  [id number]   cc:   Thomas A. Cornett, [name]  [address]  [place] [address]   Burnard Bunting, [name]  Fax: [phone]   Precious Reel, MD  Fax: [phone]

## 2010-10-14 NOTE — H&P (Signed)
NAMEANTONY, [name] NO.:  [id number]   MEDICAL RECORD NO.:  [id number]          PATIENT TYPE:  INP   LOCATION:  [id number]                         FACILITY:  [facility]   PHYSICIAN:  Precious Reel, MD       DATE OF BIRTH:  [birth year]   DATE OF ADMISSION:  10/08/2007  DATE OF DISCHARGE:                              HISTORY & PHYSICAL   PRIMARY CARE Elpidia Karn:  Burnard Bunting, [name]   CHIEF COMPLAINT:  Left abdominal and left lower quadrant pain.   HISTORY OF PRESENT ILLNESS:  This is a 60 year old male with type 2  diabetes on insulin and Glucophage, hypertension, and hyperlipidemia,  who was in his usual state of health until last night when he developed  abdominal pain left of umbilicus, lower quadrant pain.  It radiated to  the chest and arm; and there was no associated nausea, vomiting, or  diarrhea.  He took Pepto without any help.  The pain got worse over the  last 24 hours.  The workup in the emergency room revealed distant  mesenteric inflammation and mass.  He has not eaten or drunk really that  much over the 24 hours.  Blood sugars have been 96 to 120.  He reports  that his blood sugars are usually checked twice a day, and his last A1c  was 9.0 up from 6.0%.  He saw Dr. Reynaldo Minium on Tuesday, had otherwise no  other changes, and Dr. Reynaldo Minium increase his Novolin 70/30 from 70 units  to 75 units in the morning.  I have told him to come back in September  2009 for a repeat office visit and labs and to try to get A1c under  better control.  In the ED, the patient was treated with IV fluids,  oxygen, Zofran, Dilaudid, aspirin, nitroglycerin which caused a  headache, and a CT scan which showed a tumor.  We will admit for  evaluation and treatment and pain control.   PAST MEDICAL HISTORY:  1. History of atypical chest pain with negative stress test by the      patient's report.  2. Type 2 diabetes.  3. Hypertension.  4. Hyperlipidemia.  No other surgeries noted.  5.  Obesity.  6. Left toe ulceration in early last year.  He is status post      orthotics and debridement and history of right lower extremity DVT.   ALLERGIES:  No known drug allergies.   MEDICATION LIST:  1. Glucophage 500 b.i.d.  2. Novolin 70/30 seventy five units in the morning and fifty five      units at night.  3. Annette Stable Baby Aspirin 81 mg day.  He is on blood pressure pill and      a cholesterol pill.   Last note on the computer was over a year old and listed medications  include Lotrel, Actos, and lisinopril.   SOCIAL HISTORY:  No tobacco.  No alcohol.  He drives a truck locally.  He has one son.  He is currently not married.   FAMILY HISTORY:  Diabetes, hypertension, stroke, and  coronary disease.  His mother died in her 62s when she was shot by the patient's former  stepfather.  The patient's actual father died in the 44s of diabetic  complications.   REVIEW OF SYSTEMS:  No nausea, no vomiting.  A little bit of diarrhea  after the oral contrast but nothing prior.  No chest pain, no shortness  of breath.  Everything started with the abdominal pain.  He denies any  urinary system or other bowel issues.  Full review of systems were done  and negative, and he denies any fevers or any other underlying illness.   PHYSICAL EXAM:  VITAL SIGNS:  Temperature 98.7, blood pressure 121/61 up  to 150/74, heart rate 83 to 100, respiratory rate 20 to 24.  He is  saturating 96% on 2 L.  He has 10/10 pain on arrival, down to 0/10 when  he does not move.  GENERAL:  Now, alert and oriented x3.  He is slow to answer questions  after his Zofran and Dilaudid.  HEENT:  His oropharynx is dry.  He has poor dentition.  He is obese and  has a very thick neck.  CARDIAC:  Regular.  PULMONARY:  Clear to auscultation bilaterally, but he will not take very  deep breath secondary to fear of his abdominal pain.  ABDOMEN:  Tender, very sensitive, and guarding over the left abdomen in  left lower  quadrant area, but there is no rebound and nothing acute.  EXTREMITIES:  No clubbing, no cyanosis.  Minimal edema.   LABORATORY AND DIAGNOSTIC DATA:  Ancillary Data: He has a chest x-ray  with poor inspiration versus atelectasis.  CT scan of the abdomen and  pelvis shows nothing acute, but the pelvic CT shows mesenteric  stranding, tracking along the root of the mesentery at the aortic  bifurcation.  The mass is 4.1 x 3.1 cm associated with small  lymphadenopathy and significant inflammation.  There is no obstruction  noted.  Differential diagnosis of this includes mesenteric adenitis,  desmoid tumor, carcinoid lymphoma, inflammation of the enteric  duplication cyst, diverticuli, or a metastasis.  CK and troponin were  negative.  D-dimer is 0.39.  Sodium 138, potassium 4.9, chloride 103,  bicarbonate 24, creatinine 1.9, glucose 196, ionized calcium 1.22, INR  1.1.  Hemoglobin, the highest stat was 11.6, but on the CBC, it is 10.4,  white count 9.5, platelet count 324, INR 1.1, lipase 13.  EKG shows  normal sinus rhythm, biventricular block, LVH with strain.   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   PLAN:  1. Admit and get pain control and give IV fluids.  2. IV fluids and follow creatinine with the renal insufficiency, hold      the Glucophage at this current time.  He only had oral contrast.  I      initially thought he had IV contrast, but he had to continue to      hold the Glucophage based on the elevated creatinine.  The CT scan      was necessary no matter what even if IV contrast was used.  3. Anemia.  We will hemecheck all stools and follow CBCs.  We will      type and screen at this current time considering he may go to the  operating room.  4. Tumor markers have been ordered CEA, CA 19-9, PSA, SPEP, and UPEP.      Again, we will also hemecheck stools.  5. Surgery.  He has already been consulted.  I have already talked to      Dr. Harlow Asa.  I also talked to the radiologist at length to try to      figure out what this is, the best way to approach this as the tumor      is dead center of his body sort of behind the umbilicus and towards      the left lower quadrant.  He is morbidly obese gentleman and will      require based on where this is a very long needle and lots of luck.      There may be a way of doing this and probably I ought to talk to      interventional radiology if Dr. Harlow Asa thinks this can be      approached.  With his degree of pain at this current time, he does      not need to go to the operating room at this moment.  6. We will also to try to control his diabetes, although his      Glucophage is on hold, just attempt to control this with insulin.  7. I believe that he will desaturate on the narcotics and especially      with his body habitus, it is my belief that he has underlying      obstructive sleep apnea.  We will continue overnight oximetry and      keep him on FiO2.  8. We will rule out MI. He has an abnormal EKG.  I am glad that he has      recollection of a prior stress test, but since I do not have this      at this current time I will try to retrieve that tomorrow from his      office notes.  If he needs to go to the operating room due to      significant pain, I think we will have to risk it in this otherwise      unhealthy person which carries a significant morbidity and      mortality with any procedure that we do.  9. Left lower extremity squeezes for DVT prophylaxis, no to Lovenox      because he is going to go for biopsy or surgery.  10.We will also try to get as much old records as we possibly can.      Precious Reel, MD  Electronically Signed     JMR/MEDQ  D:   10/09/2007  T:  10/09/2007  Job:  [id number]   cc:   Burnard Bunting, [name]  Earnstine Regal, MD

## 2010-10-14 NOTE — Procedures (Signed)
DUPLEX DEEP VENOUS EXAM - LOWER EXTREMITY   INDICATION:  Rule out deep venous thrombosis and right calf pain.   HISTORY:  Edema:  Yes  Trauma/Surgery:  No  Pain:  Yes  PE:  No  Previous DVT:  No  Anticoagulants:  No  Other:   DUPLEX EXAM:                CFV   SFV          PopV  PTV   GSV                R  L  R     L      R  L  R  L  R  L  Thrombosis    0  0  Partial      Partial  P  P  +  +  0     0  Spontaneous   +  +  D     D      0  0  0  0  +  +  Phasic        +  +  D     D      0  0  0  0  +  +  Augmentation  +  +  D     D      0  0  0  0  +  +  Compressible  +  +  D     D      0  0  0  0  +  +  Competent     +  +  D     D      0  0  0  0  0  0   Legend:  + - yes  o - no  p - partial  D - decreased   IMPRESSION:  1. Right leg positive for deep venous thrombosis.  Partial clot in mid      to distal superficial femoral artery and proximal popliteal vein.      Complete clot in distal popliteal vein, posterior tibial veins and      peroneal veins.  2. Left leg is positive for deep venous thrombosis.  Partial clot in      mid to distal superficial femoral artery and proximal popliteal      with occlusive clot in distal popliteal, posterior tibial and      peroneal veins.    _____________________________  Rosetta Posner, [name]   NT/MEDQ  D:  09/30/2009  T:  09/30/2009  Job:  [id number]

## 2010-10-14 NOTE — H&P (Signed)
[name], [name]                 ACCOUNT NO.:  [id number]   MEDICAL RECORD NO.:  [id number]          PATIENT TYPE:  INP   LOCATION:  1301                         FACILITY:  [facility]   PHYSICIAN:  Mathis Dad. Shadad        DATE OF BIRTH:  Oct 14, 1950   DATE OF ADMISSION:  02/10/2008  DATE OF DISCHARGE:                              HISTORY & PHYSICAL   REASON FOR ADMISSION:  Nausea, vomiting and dehydration status post  chemotherapy.   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  He does have a longstanding history of  hypertension and diabetes and cared for by [name] and basically started  developing diffuse abdominal pain in May 2009.  His workup revealed some  adenopathy of the aortic bifurcation, a large lymph node measuring 4.1 x  3.1.  On May 29 the patient had a laparoscopic-assisted lymph node  biopsy, case number [id number], which showed diffuse large cell non-  Hodgkin's lymphoma with immunohistochemical stain showed large cells  positive for CD9a and CD20 and smaller neoplastic lymphocytes staining  positive for T-cell markers.  Again, the final diagnosis was a diffuse  large-cell lymphoma.  His staging workup would include a PET CT scan  that was done June 24, which showed regions of malignant FTG uptake  involving the small bowel mesenteric adenopathy, distal ileum, as well  as superior mesenteric vessels, as well as the external iliac lymph node  as well.  Predominantly below the diaphragm he had a bone marrow biopsy  done on June 29, showed no involvement of the bone marrow.  He had a  MUGA scan which showed an EF of 63% on November 21, 2007 and had an  indwelling port Port-A-Cath placed, again in June 2009.  He commenced on  systemic chemotherapy with CHOP with rituximab in June 2009 and received  total of 3 cycles of CHOP utilizing cyclophosphamide,  Adriamycin,  vincristine and prednisone, with a fairly good tolerance.  It was  complicated at times with nausea and some occasional vomiting and some  elevation in his blood sugar, required at times intravenous IV  hydration.  On January 30, 2008, he had a PET CT scan after 3 cycles,  which showed significant treatment response in the abdominal and the  pelvic lymphadenopathy including an index lesion measuring 2 x 3.2 cm  residual area in the upper pelvis previously that measured 4.1 x 5.5 cm.  When I saw him on February 03, 2008, I felt that he was ready to proceed  with the fourth cycle, which he did on February 08, 2008, and that  was  followed by Neulasta on February 09, 2008, for a neutropenia  prophylaxis.  The patient tolerated chemotherapy fairly okay on Tuesday,  Wednesday did well, however, later on Wednesday start developing nausea,  that progressing to vomiting, and Thursday he developed more vomiting  and he could not keep p.o. down including antiemetics.  The patient was  scheduled  for routine labs weekly today and he was asked to be evaluated  due to persistent nausea and vomiting.  Upon my evaluation he did not  report any fevers, did report some abdominal pain due to persistent  vomiting.  He did not miss any insulin doses.  He has continued to take  it twice a day.  He was quite fatigued and tired.  He was not able to,  again, take any food down.  Did not report any diarrhea.  Did not report  any hematochezia, did not report any melena, again overall was extremely  fatigued although did not report any orthostasis.   REVIEW OF SYSTEMS:  He did not report any headaches, blurry vision,  double vision.  He did not report any motor or sensory neuropathy,  alteration of mental status, psychiatric issues or depression or  confusion.  He did not report any fevers or chills or night sweats, did  not report any weight loss but poor appetite and really decreased p.o.  intake.   He did not report any chest pain, orthopnea and PND.  Did not  report any palpitation or orthopnea.  Did not report any chest pain or  cough or hemoptysis.  Did report nausea and vomiting.  Did report some  abdominal pain and dyspepsia.  Did not report any significant diarrhea.  Did not report any frequency, urgency or hesitancy.  Rest of the review  of systems unremarkable.   PAST MEDICAL HISTORY:  Significant for hypertension, diabetes, history  of iron-deficiency anemia, status post 1 g of Venofer, again done in  June 2009.  He has also history of retinopathy as well as debridement of  a left toe ulcer.  He has no history of coronary artery disease or renal  insufficiency.   FAMILY HISTORY:  Unremarkable for any malignant disorders.   SOCIAL HISTORY:  He is single.  He has one son.  He reports no alcohol  or tobacco abuse.  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   CURRENT MEDICATIONS:  Actos, NovoLog insulin at 75 units in the morning  and 55 units in the evening, as well as Lotrel.  He is on aspirin, iron  sulfate and Norco for pain relief.  He is also on Zofran and Phenergan  p.r.n.   PHYSICAL EXAMINATION:  Alert, awake gentleman, appeared and some mild  distress, slightly tachypneic.  His vitals showed blood pressure 125/75, his heart rate was 120 but  regular, respiratory rate at 24.  He was satting 100% on room air.  Head is normocephalic, atraumatic.  Pupils equal, round and reactive to  light.  Oral mucosa was moist and pink.  NECK:  Supple, no lymphadenopathy.  HEART:  Tachycardiac but regular.  LUNGS:  Clear to auscultation.  No rhonchi or wheezes noticed to  percussion.  ABDOMEN:  Soft, nontender, no hepatosplenomegaly.  EXTREMITIES:  No edema.   LABORATORY DATA:  Hemoglobin 10.6, white cell count of 49,000, platelet  count of 271, his RDW at 23.  Chemistries are currently pending  including blood sugar as well as bicarbonate.   IMPRESSION:  A  60 year old gentleman with the following issues:   1. He has Stage IIA non-Hodgkin's lymphoma, diffuse large-cell type.  2. Status post recent chemotherapy with CHOP and rituximab given on      February 07, 2008, and Neulasta given on February 08, 2008.  3. Nausea and vomiting and dehydration due to #2.  4. A tachycardia probably  due to his dehydration.  5. Tachypnea.  6. Leukocytosis probably related to Neulasta.  7. Poor oral intake.   PLAN:  We will admit [name] to [facility] for IV  hydration as well as antiemetics with around-the-clock Zofran and as-  needed Phenergan.  We will also restart his insulin and continue it, I  should say, with the regular dose.  I am going to obtain his  electrolytes, more specifically his bicarbonate, to make sure he is not  developing acidosis and a DKA-type picture as his tachypnea might be  slightly worsened, although this could be related to his dehydration and  fatigue as well as deconditioning and, of course, if he does develop  signs or symptoms of diabetic ketoacidosis, we will consult his primary  care service to help with that.  He will continue his prednisone as a  part of his CHOP-rituximab regimen.  I think he has 2-3 days left.  We  will institute DVT as well as GI  prophylaxis.  I will put him on an insulin sliding scale and resume his  antihypertensive medication.  He will be full code.  I do not anticipate  that [name] will be hospitalized for long.  We will anticipate quick  a recovery for [name].           ______________________________  Mathis Dad. [facility]  Electronically Signed     FNS/MEDQ  D:  02/10/2008  T:  02/11/2008  Job:  [id number]   cc:   Precious Reel, MD  Fax: [phone]   Burnard Bunting, [name]  Fax: [place] Cornett, [name]  [place] [address]

## 2010-10-14 NOTE — Discharge Summary (Signed)
[name], [name] NO.:  [id number]   MEDICAL RECORD NO.:  [id number]          PATIENT TYPE:  INP   LOCATION:  [id number]                         FACILITY:  [facility]   PHYSICIAN:  Marcy Panning, MD       DATE OF BIRTH:  05-13-1951   DATE OF ADMISSION:  03/24/2008  DATE OF DISCHARGE:  03/25/2008                               DISCHARGE SUMMARY   ADMISSION DIAGNOSES:  1. Non-Hodgkin's lymphoma status post CHOP with Rituxan chemotherapy,      last therapy given March 19, 2008.  2. Nausea and vomiting due to chemotherapy.  3. Dehydration.   DISCHARGE DIAGNOSES:  1. Nausea and vomiting with vomiting resolved.  2. Dehydration, resolved.  3. Diabetes mellitus.  4. Hypertension.  5. Non-Hodgkin's lymphoma.   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  For details of diagnosis and staging, see  dictated history and physical from March 24, 2008.  [name] is  currently getting treatment at [facility] for lymphoma.  He  has completed 2 cycles of chemotherapy with Rituxan/CHOP.  His last  treatment March 19, 2008.  He presented on March 24, 2008 with  intractable nausea and vomiting, dehydration, and was admitted for  control of nausea and vomiting as well as IV hydration.   HOSPITAL COURSE:  1. [name] was started on IV hydration and p.r.n. antiemetics.  His      nausea and vomiting stopped.  He has no emesis throughout the      night.  His IV fluids were discontinued and he was discharged home      in stable condition on March 25, 2008.  From the standpoint of      his hypoglycemia, he was continued on his Actos and placed on      sliding scale insulin.  As he resumes a regular diet, he will      continue his NovoLog insulin 70/30 as per his outpatient regimen.  2. Hypertension.  His blood pressure remained stable and he was  continued on his antihypertensive medication with Lotrel.  3. Chronic renal insufficiency.  His creatinine was 1.3 on March 25, 2008.  4. Iron deficiency anemia, status post iron replacement, he continues      p.o. iron supplementation as needed.   DISCHARGE INSTRUCTIONS:  [name] will be discharged today in stable  condition.  He will follow up at the [facility] on March 26, 2008 for laboratory data as well as IV hydration.   DISCHARGE MEDICATIONS:  1. Actos 30 mg daily.  2. Lotrel 5/20 daily.  3. NovoLog insulin 70/30 75 units in the morning and 55 units at      night.  4. Protonix 40 mg daily.  5. Zofran 8 mg every 8 hours as needed.  6. Oxycodone 5 mg 1 to 2 tablets every 4 hours as needed.   DISCHARGE INSTRUCTIONS:  He is to follow a  diabetic diet with activity  as tolerated.  Condition improved.   PHYSICAL EXAMINATION ON DISCHARGE:  GENERAL:  He is awake and alert, in  no acute distress.  VITAL SIGNS:  Temperature 98.1, pulse 98, blood pressure 155/89,  respirations 18, oxygen saturation 97% on room air.  HEENT:  Pupils are equal, round, and reactive to light and  accommodation.  Extraocular movements intact.  Oropharynx shows erythema  on the soft palate.  There are no lesions apparent and no exudate.  NECK:  Supple without palpable adenopathy or thyromegaly.  LUNGS:  Clear to auscultation.  CARDIAC:  Regular rate and rhythm without murmurs, rubs, or gallops.  ABDOMEN:  Soft and nontender.  Bowel sounds in all 4 quadrants.  There  is no palpable hepatosplenomegaly.  EXTREMITIES:  Without edema.  NEUROLOGIC:  Without focal deficits.   LABORATORY DATA:  Sodium 139, potassium 3.9, chloride 107, CO2 27, BUN  24, creatinine 1.3, glucose 212.     ______________________________  [name], NP      Marcy Panning, MD     ML/MEDQ  D:  03/25/2008  T:  03/25/2008  Job:  [id number]

## 2010-10-17 NOTE — Assessment & Plan Note (Signed)
[facility]   NAME:  [name], [name]                 ACCOUNT NO.:  [id number]   MEDICAL RECORD NO.:  [id number]      DATE OF BIRTH:  Nov 15, 1950   PHYSICIAN:  Epifania Gore. Nils Pyle, [name]      VISIT DATE:                                   OFFICE VISIT   VITAL SIGNS:  Blood pressure is 140/82.  Respirations 18.  Pulse rate  88.  Temperature is 97.8.   PURPOSE OF TODAY'S VISIT:  [name] is a 60 year old man who we are  following for Wagner grade 2 diabetic foot ulcer involving his left  foot.  We previously treated the patient with a modified healing sandal  to offload the third toe.  He presents not wearing the offloading sandal  and denying excessive drainage, malodor, pain or fever.  He has not been  to work since his last visit.   WOUND EXAM:  Inspection of the foot shows that there is persistence of 1-  2+ edema with some mild to moderate changes of statis.  The ulcer itself  is decreased in volume.  The wound was photographed, measured and  entered into the wound expert computer program.  There is no evidence of  infection or vascular compromise.  The sensation remains moderately  impaired.   DIAGNOSIS:  Clinical improvement in spite of inadequate offloading.   MANAGEMENT PLAN AND GOAL:  We have counseled the patient regarding the  importance of offloading and compliance.  We have given him a  prescription for custom inserts and extra depth shoes to be filled at  [employer].  We have filled out an insurance inquiry indicating that it  will be an additional 4 weeks before he can independently ambulate with  adequate shoes and protection.  We will reevaluate him in 2 weeks.  In  the interim, he is to continue wearing the modified custom insert and  proceed with casting for the custom insert and extra depth shoes.  He is  to call the clinic if he notes evidence of drainage, pain, increase  swelling or fever.  The patient seems to understand and indicates that  he  will be compliant.      [name], [name]  Electronically Signed     HAN/MEDQ  D:  09/06/2006  T:  09/07/2006  Job:  [id number]

## 2010-10-17 NOTE — Assessment & Plan Note (Signed)
[facility]   NAME:  [name], [name]                 ACCOUNT NO.:  [id number]   MEDICAL RECORD NO.:  [id number]      DATE OF BIRTH:  07-20-1950   PHYSICIAN:  Epifania Gore. Nils Pyle, [name] VISIT DATE:  11/01/2006                                   OFFICE VISIT   SUBJECTIVE:  [name] is a 60 year old man who we followed for a Wagner  grade 2 diabetic foot ulcer involving the left third toe.  In the  interim he has used the Iodosorb gel and has completed the procurement  of custom inserts and shoes.  He continues to comply.  He denies  excessive drainage, malodor, pain, or fever.   OBJECTIVE:  Blood pressure is 166/102, respiratory rate 18, pulse rate  94, capillary blood glucose is 214 this morning.   EXAMINATION:  Inspection of the left foot shows that there is trace  edema.  The ulcer itself is slightly enlarged.  A full thickness  debridement was performed with hemorrhage control with direct pressure.  Inspection of his shoes appears that they are adequate.  There is no  evidence of ascending infection.  There is no malodor.  Pedal pulse  remains palpable.   ASSESSMENT:  A Wagner grade 2 diabetic foot ulcer, questionable adequacy  of offloading.   PLAN:  We will continue Iodosorb and antiseptic wash.  The patient has  continued to break-in the custom orthotics.  We will reevaluate him in 2  weeks.  He has been counseled to continue inspecting his feet everyday  and if there is any significant change, increased drainage, malodor,  warmth, local intense swelling he is to call the clinic for an interim  evaluation.  The patient indicates that he understands and will be  compliant.      [name], [name]  Electronically Signed     HAN/MEDQ  D:  11/01/2006  T:  11/01/2006  Job:  [id number]

## 2010-10-17 NOTE — Assessment & Plan Note (Signed)
[facility]   NAME:  John, [name]                 ACCOUNT NO.:  [id number]   MEDICAL RECORD NO.:  [id number]      DATE OF BIRTH:  1950-12-12   PHYSICIAN:  Epifania Gore. Nils Pyle, [name] VISIT DATE:  10/04/2006                                   OFFICE VISIT   SUBJECTIVE:  [name] is a 60 year old man who we are following with a  Wagner grade 2 diabetic foot ulcer involving his third left toe.  In the  interim, the patient has been wrapping the third toe and utilizing  Iodoform gel and antiseptic soap.  He notes that he has had some injury  related to the medial third toe related to the wrap.  He denies interim  fever or pain.   OBJECTIVE:  Blood pressure 165/91, respirations 20, pulse rate of 103,  temperature 97.6, capillary blood glucose is 181 mg%.   Inspection of the left foot shows that there is trace edema with actual  clinical improvement of wound #1 on the tip of the left third toe.  At  the base of the toe, however, there are areas of denudation and healthy  granulation with no evidence of infection or suppurative changes.  The  patient remains in sensate, and there are no evidences of acute arterial  insufficiency.   ASSESSMENT:  Clinical improvement with offloading but iatrogenic injury  related to the patient's use of bandage.   PLAN:  We have instructed the patient to discontinue any compressive  wrap or Band-Aid on his second toe.  He is to rather use simply  antiseptic soap wash and a topical application of Iodosorb.  He is to  continue to pursue procurement of this custom shoes, which he says  should be available in 2 weeks.  In the interim, he will continue  offloading with his healing sandal.  He has been given a prescription  for bilateral below-the-knee open-toed custom compression hose.  He is  in the process of procuring these stockings from a local vendor.  We  will reevaluate him in 2 weeks p.r.n.  I have given him a work excuse  for an  additional two weeks pending re-evaluation.      [name], [name]  Electronically Signed     HAN/MEDQ  D:  10/04/2006  T:  10/04/2006  Job:  [id number]   cc:   Carole Civil, [name]

## 2010-10-17 NOTE — Assessment & Plan Note (Signed)
[facility]   NAME:  [name], [name]                 ACCOUNT NO.:  [id number]   MEDICAL RECORD NO.:  [id number]      DATE OF BIRTH:  11-16-1950   PHYSICIAN:  Epifania Gore. Nils Pyle, [name] VISIT DATE:  10/18/2006                                   OFFICE VISIT   SUBJECTIVE:  [name] returns for a followup of a Wagner grade 2  diabetic foot ulcer.  In the interim we have treated him with antiseptic  soap, an offloading healing sandal, and clean cotton socks as a  dressing.  He has been off work for the past 2 weeks.  He returns for  followup.  In the interim he has been fitted for custom shoes and  inserts.  He denies pain, drainage, malodor, or fever.   OBJECTIVE:  Blood pressure 122/72, respirations are 20, pulse of 101,  temperature 97, capillary blood glucose is 212 mg%.   EXAMINATION:  Inspection of the left third toe shows that there is 100%  granulation, clean, noninfected, and contracted wound.  Pedal pulses  remain palpable.   ASSESSMENT:  Clinical improvement.   PLAN:  The patient is to receive his custom shoes and inserts today.  We  have instructed him in the gradual breaking-in of these shoes, which  will be further detailed by the vendor.  We have instructed the patient  to be vigilant in his diabetic foot care and foot inspection.  We will  re-evaluate him in 2 weeks prior to returning him to work.      [name], [name]  Electronically Signed     HAN/MEDQ  D:  10/18/2006  T:  10/18/2006  Job:  [id number]

## 2010-10-17 NOTE — Consult Note (Signed)
[name], [name]                 ACCOUNT NO.:  [id number]   MEDICAL RECORD NO.:  [id number]          PATIENT TYPE:  REC   LOCATION:  FOOT                         FACILITY:  [facility]   PHYSICIAN:  Epifania Gore. Nils Pyle, M.D.DATE OF BIRTH:  [birth year]   DATE OF CONSULTATION:  08/30/2006  DATE OF DISCHARGE:                                 CONSULTATION   SUBJECTIVE:  [name] is a 60 year old man referred by Dr. Burnard Bunting for evaluation of a diabetic foot ulcer.   IMPRESSION:  A Wagner grade 2 diabetic foot ulcer, left foot, third toe.   RECOMMENDATIONS:  1. Continuation of p.o. antibiotics, Keflex 500 mg q.i.d.  2. Also the patient is placed in an offloading healing sandal with a      transverse felt strip.  3. He will continue b.i.d. antiseptic soap washes with wearing of a      white cotton sock and topical Neosporin.  4. Full-thickness debridement was performed in the [place].  5. The patient had been instructed to be off of his feet except for      minimal required ambulation for 2 weeks.  That would necessitate      his being off of work for 2 weeks.  We will reevaluate him in at      weekly intervals p.r.n..   SUBJECTIVE:  [name] is a 60 year old diabetic who 7-10 days ago and  noted mild pain in his left foot and a bloody sock.  Upon further  inspection of his foot.  He noted that he had a ruptured blister with  swelling.  There was no malodor and no fever.  He was seen by Dr.  Reynaldo Minium and instructed in the management of his diabetes and given p.o.  antibiotics and referred to the [place].   PAST MEDICAL HISTORY:  1. His past medical history is remarkable for diabetes.  He has had a      diagnosis for 12 years.  2. 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   ALLERGIES:  He denies allergies.   PAST SURGICAL HISTORY:  His previous surgery has included only a  circumcision.   CURRENT MEDICATIONS:  1. Cefazolin 500 mg q.i.d.  2. Lotrel  5/20 mg daily.  3. Actos 45 mg daily.  4. Lisinopril 20 mg daily.  5. Glucophage 500 mg daily.  6. Aspirin one a day.  7. Humalog 70/30, 100 units in the morning; 50 units in the evening.  8. Glumetza 500 mg daily.   FAMILY HISTORY:  Positive for diabetes and hypertension as well as  stroke and heart attack.   SOCIAL HISTORY:  Socially he is single.  He lives with a girlfriend.   REVIEW OF SYSTEMS:  Review of systems discloses the absence of angina  pectoris.  He has no exertional dyspnea.  He denies visual changes,  transient paralysis, speech impediments or other features of TIAs.  He  has gained 15 pounds over the last year.  He does have some polydipsia  and admits to twice nightly nocturia.  He denies  syncope, bowel or  bladder dysfunction.  The remainder of his review of systems is  negative.   PHYSICAL EXAMINATION:  GENERAL:  He is an alert, oriented man in no  acute distress.  He is accompanied by a lady.  VITAL SIGNS:  Blood pressure is 137/87, pulse of 92, respirations 20,  temperature 97.8, capillary blood glucose was 214 mg/%.  HEENT:  Remarkable for injected sclerae.  The trachea is midline.  Thyroid is  nonpalpable.  Carotid bruits are not appreciated.  LUNGS:  Clear.  HEART:  Heart sounds were distant.  ABDOMEN:  Soft.  EXTREMITIES:  Warm.  There is trace edema in the left lower extremity.  The pedal pulses are 3+.  Ankle-brachial index is 1 and  1.2 in the left  and right respectively.  There are chronic changes of stasis on the  third digit of the left foot.   There is a full-thickness blister with serous drainage.  This was  debrided, full-thickness with scissors and 10-blade.  Hemorrhage was  controlled with direct pressure.  A culture was taken of the wound.  The  patient is insensate to the [facility] filament.  There is no regional  adenopathy.   DISCUSSION:  The patient presents with a Wagner grade 2 diabetic foot  ulcer.  We will offload him using a  healing sandal with protective felt  strips.  We have instructed him in wound care to include changing the  socks twice a day, using antiseptic soap and thorough drying.  He is not  to wear his regular shoe until further notified.   We have explained the long-range therapeutic objectives of getting the  patient into extra depth shoes with enlarged toe box to prevent  recurrent trauma.  We have encouraged him to continue the antibiotics  which he is already taking and to be compliant with medical management  of Dr. Reynaldo Minium.  We have given him an opportunity to ask questions.  He  has indicated that he needs an excuse for his work which we have  provided him.  He expresses gratitude for having been seen in the clinic  and indicates that he will be compliant as per above.      [name], [name]  Electronically Signed     HAN/MEDQ  D:  08/30/2006  T:  08/30/2006  Job:  [id number]   cc:   Burnard Bunting, [name]

## 2010-10-17 NOTE — Assessment & Plan Note (Signed)
[facility]   NAME:  [name], TATES                 ACCOUNT NO.:  [id number]   MEDICAL RECORD NO.:  [id number]      DATE OF BIRTH:  1950-08-16   PHYSICIAN:  Epifania Gore. Nils Pyle, [name] VISIT DATE:  09/20/2006                                   OFFICE VISIT   SUBJECTIVE:  [name] is a 60 year old man we follow for Wagner grade 2  diabetic foot ulcer.  We have ordered custom inserts and extra depth  shoes to provide better offloading.  In the interim we have treated him  with a modified healing sandal.  He continues to apply Iodosorb and use  routine diabetic foot hygiene.  He denies interim pain, fever, or  drainage.   OBJECTIVE:  Blood pressure is 88/56, respirations are 78, pulse rate of  102, temperature 97.9.   EXAMINATION:  His examination of his lower extremity shows that there is  persistence of 2+ bilateral edema with mild changes of stasis.  The  pedal pulses remain palpable.  The ulcer itself has decreased  significantly.  The wound was photographed and cataloged.  There was no  evidence of infection.  The patient informs me that he is scheduled to  be cast for his custom orthotics tomorrow.   ASSESSMENT:  Clinical improvement of a Wagner grade 2 diabetic foot  ulcer.   PLAN:  The patient will continue to wear the healing sandal and utilize  the Iodosorb as previously instructed.  He will continue the pursuit of  his custom orthotics.  We will reevaluate him in 2 weeks p.r.n.      [name], [name]  Electronically Signed     HAN/MEDQ  D:  09/20/2006  T:  09/20/2006  Job:  [id number]

## 2010-10-17 NOTE — Assessment & Plan Note (Signed)
[facility]   NAME:  [name], [name]                 ACCOUNT NO.:  [id number]   MEDICAL RECORD NO.:  [id number]          DATE OF BIRTH:   PHYSICIAN:  Epifania Gore. Nils Pyle, [name] VISIT DATE:  11/15/2006                                   OFFICE VISIT   SUBJECTIVE:  [name] is a 60 year old man who we follow for Wagner  grade 2 ulceration involving his left third toe.  He has been wearing  custom orthotics and utilizing Iodosorb ointment.  There has been no  excessive drainage, malodor or fever.  He feels that the wound is  resolved at this time.   OBJECTIVE:  Blood pressure is 158/89, respirations are 14, pulse is 101,  temperature 97.1.  Capillary blood glucose 190 mg%.  Inspection of the  left third toe shows that the wound has completely resolved.  There is  an area of callus, but this is well contained.  There is no evidence of  subdermal hemorrhage.   ASSESSMENT:  Resolved wound.   PLAN:  We have instructed the patient to continue the use of his custom  orthotics.  To be reevaluated by the orthotist every 3 months to assess  the need for adjustment.  He is to continue under the care of Dr.  Burnard Bunting.  We have given the patient an opportunity to asks  questions.  He seems to understand and expresses gratitude for having  been seen in the clinic.  He is to return to work with the  aforementioned precautions.      [name], [name]  Electronically Signed     HAN/MEDQ  D:  11/15/2006  T:  11/15/2006  Job:  [id number]   cc:   Burnard Bunting, [name]

## 2010-10-17 NOTE — Assessment & Plan Note (Signed)
[facility]   NAME:  [name], [name]                 ACCOUNT NO.:  [id number]   MEDICAL RECORD NO.:  [id number]      DATE OF BIRTH:  December 01, 1950   PHYSICIAN:  Epifania Gore. Nils Pyle, [name]      VISIT DATE:                                   OFFICE VISIT   Audio too short to transcribe (less than 5 seconds)      [name], [name]     Rondel Oh  D:  10/04/2006  T:  10/04/2006  Job:  [id number]

## 2010-12-09 ENCOUNTER — Encounter (HOSPITAL_COMMUNITY)
Admission: RE | Admit: 2010-12-09 | Discharge: 2010-12-09 | Disposition: A | Discharge status: Home / Self Care | Payer: Medicare Other | Source: Ambulatory Visit | Attending: Oncology | Admitting: Oncology

## 2010-12-09 ENCOUNTER — Encounter (HOSPITAL_COMMUNITY): Payer: Self-pay

## 2010-12-09 DIAGNOSIS — C8589 Other specified types of non-Hodgkin lymphoma, extranodal and solid organ sites: Secondary | ICD-10-CM | POA: Insufficient documentation

## 2010-12-09 DIAGNOSIS — C8590 Non-Hodgkin lymphoma, unspecified, unspecified site: Secondary | ICD-10-CM

## 2010-12-09 MED ORDER — FLUDEOXYGLUCOSE F - 18 (FDG) INJECTION: 16.7000 | Freq: Once | INTRAVENOUS | Status: AC | PRN

## 2010-12-10 LAB — GLUCOSE, CAPILLARY: Glucose-Capillary: 73 mg/dL (ref 70–99)

## 2011-01-08 ENCOUNTER — Emergency Department (HOSPITAL_COMMUNITY)
Admission: EM | Admit: 2011-01-08 | Discharge: 2011-01-08 | Disposition: A | Discharge status: Home / Self Care | Payer: Medicare Other | Attending: Emergency Medicine | Admitting: Emergency Medicine

## 2011-01-08 ENCOUNTER — Emergency Department (HOSPITAL_COMMUNITY): Payer: Medicare Other

## 2011-01-08 DIAGNOSIS — E119 Type 2 diabetes mellitus without complications: Secondary | ICD-10-CM | POA: Insufficient documentation

## 2011-01-08 DIAGNOSIS — R51 Headache: Secondary | ICD-10-CM | POA: Insufficient documentation

## 2011-01-08 DIAGNOSIS — I10 Essential (primary) hypertension: Secondary | ICD-10-CM | POA: Insufficient documentation

## 2011-01-08 DIAGNOSIS — Z87898 Personal history of other specified conditions: Secondary | ICD-10-CM | POA: Insufficient documentation

## 2011-01-08 LAB — GLUCOSE, CAPILLARY: Glucose-Capillary: 85 mg/dL (ref 70–99)

## 2011-01-12 ENCOUNTER — Inpatient Hospital Stay (HOSPITAL_COMMUNITY)
Admission: EM | Admit: 2011-01-12 | Discharge: 2011-01-16 | DRG: 682 | Disposition: A | Discharge status: Home Health | Payer: Medicare Other | Attending: Internal Medicine | Admitting: Internal Medicine

## 2011-01-12 ENCOUNTER — Other Ambulatory Visit: Payer: Self-pay

## 2011-01-12 ENCOUNTER — Emergency Department (HOSPITAL_COMMUNITY): Payer: Medicare Other

## 2011-01-12 ENCOUNTER — Encounter (HOSPITAL_COMMUNITY): Payer: Self-pay | Admitting: *Deleted

## 2011-01-12 DIAGNOSIS — N289 Disorder of kidney and ureter, unspecified: Secondary | ICD-10-CM

## 2011-01-12 DIAGNOSIS — N19 Unspecified kidney failure: Secondary | ICD-10-CM | POA: Diagnosis present

## 2011-01-12 DIAGNOSIS — I129 Hypertensive chronic kidney disease with stage 1 through stage 4 chronic kidney disease, or unspecified chronic kidney disease: Secondary | ICD-10-CM | POA: Diagnosis present

## 2011-01-12 DIAGNOSIS — J189 Pneumonia, unspecified organism: Secondary | ICD-10-CM

## 2011-01-12 DIAGNOSIS — N179 Acute kidney failure, unspecified: Principal | ICD-10-CM | POA: Diagnosis present

## 2011-01-12 DIAGNOSIS — D539 Nutritional anemia, unspecified: Secondary | ICD-10-CM | POA: Diagnosis present

## 2011-01-12 DIAGNOSIS — Z860100 Personal history of colon polyps, unspecified: Secondary | ICD-10-CM

## 2011-01-12 DIAGNOSIS — N189 Chronic kidney disease, unspecified: Secondary | ICD-10-CM | POA: Diagnosis present

## 2011-01-12 DIAGNOSIS — I509 Heart failure, unspecified: Secondary | ICD-10-CM

## 2011-01-12 DIAGNOSIS — N039 Chronic nephritic syndrome with unspecified morphologic changes: Secondary | ICD-10-CM | POA: Diagnosis present

## 2011-01-12 DIAGNOSIS — D6832 Hemorrhagic disorder due to extrinsic circulating anticoagulants: Secondary | ICD-10-CM

## 2011-01-12 DIAGNOSIS — IMO0001 Reserved for inherently not codable concepts without codable children: Secondary | ICD-10-CM

## 2011-01-12 DIAGNOSIS — E872 Acidosis, unspecified: Secondary | ICD-10-CM

## 2011-01-12 DIAGNOSIS — Z86718 Personal history of other venous thrombosis and embolism: Secondary | ICD-10-CM

## 2011-01-12 DIAGNOSIS — E119 Type 2 diabetes mellitus without complications: Secondary | ICD-10-CM

## 2011-01-12 DIAGNOSIS — E876 Hypokalemia: Secondary | ICD-10-CM

## 2011-01-12 DIAGNOSIS — D649 Anemia, unspecified: Secondary | ICD-10-CM

## 2011-01-12 DIAGNOSIS — I10 Essential (primary) hypertension: Secondary | ICD-10-CM

## 2011-01-12 DIAGNOSIS — C8589 Other specified types of non-Hodgkin lymphoma, extranodal and solid organ sites: Secondary | ICD-10-CM

## 2011-01-12 DIAGNOSIS — E1165 Type 2 diabetes mellitus with hyperglycemia: Secondary | ICD-10-CM | POA: Diagnosis present

## 2011-01-12 DIAGNOSIS — N058 Unspecified nephritic syndrome with other morphologic changes: Secondary | ICD-10-CM | POA: Diagnosis present

## 2011-01-12 DIAGNOSIS — R791 Abnormal coagulation profile: Secondary | ICD-10-CM | POA: Diagnosis present

## 2011-01-12 DIAGNOSIS — E1129 Type 2 diabetes mellitus with other diabetic kidney complication: Secondary | ICD-10-CM | POA: Diagnosis present

## 2011-01-12 DIAGNOSIS — N184 Chronic kidney disease, stage 4 (severe): Secondary | ICD-10-CM | POA: Diagnosis present

## 2011-01-12 DIAGNOSIS — Z794 Long term (current) use of insulin: Secondary | ICD-10-CM

## 2011-01-12 DIAGNOSIS — R1084 Generalized abdominal pain: Secondary | ICD-10-CM

## 2011-01-12 DIAGNOSIS — E871 Hypo-osmolality and hyponatremia: Secondary | ICD-10-CM | POA: Diagnosis present

## 2011-01-12 DIAGNOSIS — D631 Anemia in chronic kidney disease: Secondary | ICD-10-CM | POA: Diagnosis present

## 2011-01-12 DIAGNOSIS — T45515A Adverse effect of anticoagulants, initial encounter: Secondary | ICD-10-CM | POA: Diagnosis present

## 2011-01-12 HISTORY — DX: Acute embolism and thrombosis of unspecified deep veins of unspecified lower extremity: I82.409

## 2011-01-12 HISTORY — DX: Type 2 diabetes mellitus with diabetic nephropathy: E11.21

## 2011-01-12 HISTORY — DX: Essential (primary) hypertension: I10

## 2011-01-12 HISTORY — DX: Non-Hodgkin lymphoma, unspecified, unspecified site: C85.90

## 2011-01-12 HISTORY — DX: Hyperlipidemia, unspecified: E78.5

## 2011-01-12 LAB — BASIC METABOLIC PANEL
BUN: 48 mg/dL — ABNORMAL HIGH (ref 6–23)
CO2: 16 mEq/L — ABNORMAL LOW (ref 19–32)
Calcium: 8.8 mg/dL (ref 8.4–10.5)
Chloride: 96 mEq/L (ref 96–112)
Creatinine, Ser: 4.58 mg/dL — ABNORMAL HIGH (ref 0.50–1.35)
GFR calc Af Amer: 16 mL/min — ABNORMAL LOW (ref >60)
GFR calc non Af Amer: 13 mL/min — ABNORMAL LOW (ref >60)
Glucose, Bld: 122 mg/dL — ABNORMAL HIGH (ref 70–99)
Potassium: 3 mEq/L — ABNORMAL LOW (ref 3.5–5.1)
Sodium: 131 mEq/L — ABNORMAL LOW (ref 135–145)

## 2011-01-12 LAB — URINE MICROSCOPIC-ADD ON

## 2011-01-12 LAB — URINALYSIS, ROUTINE W REFLEX MICROSCOPIC
Glucose, UA: 100 mg/dL — AB
Ketones, ur: NEGATIVE mg/dL
Leukocytes, UA: NEGATIVE
Nitrite: NEGATIVE
Protein, ur: 100 mg/dL — AB
Specific Gravity, Urine: 1.025 (ref 1.005–1.030)
Urobilinogen, UA: 0.2 mg/dL (ref 0.0–1.0)
pH: 6 (ref 5.0–8.0)

## 2011-01-12 LAB — PRO B NATRIURETIC PEPTIDE: Pro B Natriuretic peptide (BNP): 11112 pg/mL — ABNORMAL HIGH (ref 0–125)

## 2011-01-12 LAB — BLOOD GAS, ARTERIAL
Acid-base deficit: 9.4 mmol/L — ABNORMAL HIGH (ref 0.0–2.0)
Bicarbonate: 14.9 mEq/L — ABNORMAL LOW (ref 20.0–24.0)
Drawn by: 23534
O2 Content: 3.5 L/min
O2 Saturation: 96.3 %
Patient temperature: 37
TCO2: 14.3 mmol/L (ref 0–100)
pCO2 arterial: 26.8 mmHg — ABNORMAL LOW (ref 35.0–45.0)
pH, Arterial: 7.366 (ref 7.350–7.450)
pO2, Arterial: 93.2 mmHg (ref 80.0–100.0)

## 2011-01-12 LAB — DIFFERENTIAL
Basophils Absolute: 0 10*3/uL (ref 0.0–0.1)
Basophils Relative: 0 % (ref 0–1)
Eosinophils Absolute: 0 10*3/uL (ref 0.0–0.7)
Eosinophils Relative: 0 % (ref 0–5)
Lymphocytes Relative: 6 % — ABNORMAL LOW (ref 12–46)
Lymphs Abs: 1 10*3/uL (ref 0.7–4.0)
Monocytes Absolute: 0.5 10*3/uL (ref 0.1–1.0)
Monocytes Relative: 3 % (ref 3–12)
Neutro Abs: 15.4 10*3/uL — ABNORMAL HIGH (ref 1.7–7.7)
Neutrophils Relative %: 91 % — ABNORMAL HIGH (ref 43–77)

## 2011-01-12 LAB — CBC
HCT: 23.4 % — ABNORMAL LOW (ref 39.0–52.0)
Hemoglobin: 7.8 g/dL — ABNORMAL LOW (ref 13.0–17.0)
MCH: 24.9 pg — ABNORMAL LOW (ref 26.0–34.0)
MCHC: 33.3 g/dL (ref 30.0–36.0)
MCV: 74.8 fL — ABNORMAL LOW (ref 78.0–100.0)
Platelets: 421 10*3/uL — ABNORMAL HIGH (ref 150–400)
RBC: 3.13 MIL/uL — ABNORMAL LOW (ref 4.22–5.81)
RDW: 16.8 % — ABNORMAL HIGH (ref 11.5–15.5)
WBC: 16.9 10*3/uL — ABNORMAL HIGH (ref 4.0–10.5)

## 2011-01-12 LAB — CARDIAC PANEL(CRET KIN+CKTOT+MB+TROPI)
CK, MB: 4.5 ng/mL — ABNORMAL HIGH (ref 0.3–4.0)
Relative Index: 1 (ref 0.0–2.5)
Total CK: 447 U/L — ABNORMAL HIGH (ref 7–232)
Troponin I: <0.3 ng/mL (ref <0.30)

## 2011-01-12 LAB — TYPE AND SCREEN
ABO/RH(D): A POS
Antibody Screen: NEGATIVE

## 2011-01-12 LAB — GLUCOSE, CAPILLARY
Glucose-Capillary: 460 mg/dL — ABNORMAL HIGH (ref 70–99)
Glucose-Capillary: 466 mg/dL — ABNORMAL HIGH (ref 70–99)

## 2011-01-12 LAB — PROTIME-INR
INR: 9.89 (ref 0.00–1.49)
Prothrombin Time: 80.3 seconds — ABNORMAL HIGH (ref 11.6–15.2)

## 2011-01-12 LAB — CREATININE, URINE, RANDOM: Creatinine, Urine: 72.7 mg/dL

## 2011-01-12 MED ORDER — FUROSEMIDE 10 MG/ML IJ SOLN
100.0000 mg | Freq: Two times a day (BID) | INTRAVENOUS | Status: DC
  Administered 2011-01-13 – 2011-01-14 (×5): 100 mg via INTRAVENOUS
  Filled 2011-01-12 (×7): qty 10

## 2011-01-12 MED ORDER — FUROSEMIDE 10 MG/ML IJ SOLN
INTRAMUSCULAR | Status: AC
  Filled 2011-01-12: qty 10

## 2011-01-12 MED ORDER — POTASSIUM CHLORIDE CRYS ER 20 MEQ PO TBCR
40.0000 meq | EXTENDED_RELEASE_TABLET | Freq: Once | ORAL | Status: AC
  Administered 2011-01-12: 40 meq via ORAL
  Filled 2011-01-12: qty 2

## 2011-01-12 MED ORDER — VITAMIN K1 10 MG/ML IJ SOLN
5.0000 mg | INTRAVENOUS | Status: AC
  Administered 2011-01-12: 5 mg via INTRAVENOUS
  Filled 2011-01-12: qty 0.5

## 2011-01-12 MED ORDER — METHYLPREDNISOLONE SODIUM SUCC 125 MG IJ SOLR
125.0000 mg | Freq: Once | INTRAMUSCULAR | Status: AC
  Administered 2011-01-12: 125 mg via INTRAVENOUS
  Filled 2011-01-12: qty 2

## 2011-01-12 MED ORDER — DOCUSATE SODIUM 100 MG PO CAPS
100.0000 mg | ORAL_CAPSULE | Freq: Two times a day (BID) | ORAL | Status: DC
  Administered 2011-01-13 – 2011-01-16 (×7): 100 mg via ORAL
  Filled 2011-01-12 (×7): qty 1

## 2011-01-12 MED ORDER — FUROSEMIDE 10 MG/ML IJ SOLN
40.0000 mg | Freq: Once | INTRAMUSCULAR | Status: AC
  Administered 2011-01-12: 40 mg via INTRAVENOUS
  Filled 2011-01-12: qty 4

## 2011-01-12 MED ORDER — FLEET ENEMA 7-19 GM/118ML RE ENEM: 1.0000 | ENEMA | RECTAL | Status: DC | PRN

## 2011-01-12 MED ORDER — TRAZODONE HCL 50 MG PO TABS: 25.0000 mg | ORAL_TABLET | Freq: Every evening | ORAL | Status: DC | PRN

## 2011-01-12 MED ORDER — INSULIN ASPART 100 UNIT/ML ~~LOC~~ SOLN: 0.0000 [IU] | SUBCUTANEOUS | Status: DC

## 2011-01-12 MED ORDER — POTASSIUM CHLORIDE 10 MEQ/100ML IV SOLN
10.0000 meq | INTRAVENOUS | Status: AC
  Filled 2011-01-12: qty 100

## 2011-01-12 MED ORDER — ALBUTEROL SULFATE (5 MG/ML) 0.5% IN NEBU
5.0000 mg | INHALATION_SOLUTION | Freq: Once | RESPIRATORY_TRACT | Status: AC
  Administered 2011-01-12: 5 mg via RESPIRATORY_TRACT
  Filled 2011-01-12: qty 1

## 2011-01-12 MED ORDER — ALBUTEROL SULFATE (5 MG/ML) 0.5% IN NEBU: 2.5000 mg | INHALATION_SOLUTION | Freq: Once | RESPIRATORY_TRACT | Status: DC

## 2011-01-12 MED ORDER — ONDANSETRON HCL 4 MG/2ML IJ SOLN: 4.0000 mg | Freq: Four times a day (QID) | INTRAMUSCULAR | Status: DC | PRN

## 2011-01-12 MED ORDER — ACETAMINOPHEN 325 MG PO TABS
650.0000 mg | ORAL_TABLET | Freq: Four times a day (QID) | ORAL | Status: DC | PRN
  Administered 2011-01-14 (×2): 650 mg via ORAL
  Filled 2011-01-12: qty 1
  Filled 2011-01-12: qty 2

## 2011-01-12 MED ORDER — SODIUM CHLORIDE 0.9 % IJ SOLN
3.0000 mL | INTRAMUSCULAR | Status: DC | PRN
  Administered 2011-01-13: 3 mL via INTRAVENOUS
  Filled 2011-01-12: qty 3

## 2011-01-12 MED ORDER — ACETAMINOPHEN 650 MG RE SUPP: 650.0000 mg | Freq: Four times a day (QID) | RECTAL | Status: DC | PRN

## 2011-01-12 MED ORDER — NITROGLYCERIN 2 % TD OINT
1.0000 [in_us] | TOPICAL_OINTMENT | Freq: Once | TRANSDERMAL | Status: AC
  Administered 2011-01-12: 1 [in_us] via TOPICAL
  Filled 2011-01-12: qty 1

## 2011-01-12 MED ORDER — DEXTROSE 5 % IV SOLN
500.0000 mg | Freq: Once | INTRAVENOUS | Status: AC
  Administered 2011-01-12: 500 mg via INTRAVENOUS
  Filled 2011-01-12: qty 500

## 2011-01-12 MED ORDER — VITAMIN K1 10 MG/ML IJ SOLN
INTRAMUSCULAR | Status: AC
  Filled 2011-01-12: qty 1

## 2011-01-12 MED ORDER — PHYTONADIONE 5 MG PO TABS
10.0000 mg | ORAL_TABLET | ORAL | Status: AC
  Administered 2011-01-12: 10 mg via ORAL
  Filled 2011-01-12: qty 2

## 2011-01-12 MED ORDER — BISACODYL 10 MG RE SUPP: 10.0000 mg | RECTAL | Status: DC | PRN

## 2011-01-12 MED ORDER — POLYETHYLENE GLYCOL 3350 17 G PO PACK
17.0000 g | PACK | Freq: Every day | ORAL | Status: DC
  Administered 2011-01-13: 09:00:00 via ORAL
  Administered 2011-01-14 – 2011-01-16 (×3): 17 g via ORAL
  Filled 2011-01-12 (×4): qty 1

## 2011-01-12 MED ORDER — SODIUM CHLORIDE 0.9 % IV SOLN: INTRAVENOUS | Status: DC

## 2011-01-12 MED ORDER — SENNOSIDES-DOCUSATE SODIUM 8.6-50 MG PO TABS: 1.0000 | ORAL_TABLET | Freq: Every day | ORAL | Status: DC | PRN

## 2011-01-12 MED ORDER — INSULIN ASPART 100 UNIT/ML ~~LOC~~ SOLN
0.0000 [IU] | Freq: Every day | SUBCUTANEOUS | Status: DC
  Filled 2011-01-12: qty 3

## 2011-01-12 MED ORDER — ONDANSETRON HCL 4 MG PO TABS: 4.0000 mg | ORAL_TABLET | Freq: Four times a day (QID) | ORAL | Status: DC | PRN

## 2011-01-12 MED ORDER — DEXTROSE 5 % IV SOLN
1.0000 g | Freq: Once | INTRAVENOUS | Status: AC
  Administered 2011-01-12: 1 g via INTRAVENOUS
  Filled 2011-01-12: qty 1

## 2011-01-12 MED ORDER — PHYTONADIONE 5 MG PO TABS
ORAL_TABLET | ORAL | Status: AC
  Filled 2011-01-12: qty 2

## 2011-01-12 MED ORDER — IPRATROPIUM BROMIDE 0.02 % IN SOLN
0.5000 mg | Freq: Once | RESPIRATORY_TRACT | Status: AC
  Administered 2011-01-12: 0.5 mg via RESPIRATORY_TRACT
  Filled 2011-01-12: qty 2.5

## 2011-01-12 MED ORDER — MORPHINE SULFATE 2 MG/ML IJ SOLN
1.0000 mg | INTRAMUSCULAR | Status: DC | PRN
  Administered 2011-01-14 – 2011-01-15 (×3): 1 mg via INTRAVENOUS
  Filled 2011-01-12 (×3): qty 1

## 2011-01-12 NOTE — ED Notes (Signed)
Patient stated he cannot give [place] a urine sample yet.  Left urinal in room at bedside.

## 2011-01-12 NOTE — ED Notes (Signed)
Pt resting quietly at this time with wife at his side. Pt remains on 2 lpm   With respirations less labored at the moment.

## 2011-01-12 NOTE — ED Provider Notes (Signed)
History     CSN: [id number] Arrival date & time: 01/12/2011  2:34 PM  Chief Complaint  Patient presents with  . Shortness of Breath   HPI Pt was seen at 1515.  Per pt and spouse, c/o gradual onset and worsening of persistent SOB, cough and "wheezing" x2 days.  Unsure if his bilat LE's pedal edema has increased from his usual.  Denies fevers, no CP/palpitations, no abd pain, no N/V/D, no back pain, no rash.     Past Medical History  Diagnosis Date  . Cancer   . Diabetes mellitus   . DVT (deep venous thrombosis)   . Diabetic nephropathy   . Diabetic retinopathy   . Hypertension   . Hyperlipidemia   . Non Hodgkin's lymphoma     Tx 2009    History reviewed. No pertinent past surgical history.  History reviewed. No pertinent family history.  History  Substance Use Topics  . Smoking status: Never Smoker   . Smokeless tobacco: Not on file  . Alcohol Use: No    Review of Systems ROS: Statement: All systems negative except as marked or noted in the HPI; Constitutional: Negative for fever and chills. ; ; Eyes: Negative for eye pain and discharge. ; ; ENMT: Negative for ear pain, hoarseness, nasal congestion, sinus pressure and sore throat. ; ; Cardiovascular: Negative for chest pain, palpitations, diaphoresis, +dyspnea and peripheral edema. ; ; Respiratory: Positive for cough and wheezing, and negative for stridor. ; ; Gastrointestinal: Negative for nausea, vomiting, diarrhea and abdominal pain. ; ; Genitourinary: Negative for dysuria, flank pain and hematuria. ; ; Musculoskeletal: Negative for back pain and neck pain. ; ; Skin: Negative for rash and skin lesion. ; ; Neuro: Negative for headache, lightheadedness and neck stiffness.   Physical Exam  BP 140/74  Pulse 94  Temp(Src) 98.8 F (37.1 C) (Oral)  Resp 20  Ht 6' 3.5" (1.918 m)  Wt 313 lb 7.9 oz (142.2 kg)  BMI 38.67 kg/m2  SpO2 94%  Physical Exam 1520: Physical examination:  Nursing notes reviewed; Vital signs and O2  SAT reviewed;  Constitutional: Well developed, Well nourished, Well hydrated, Uncomfortable appearing; Head:  Normocephalic, atraumatic; Eyes: EOMI, PERRL, No scleral icterus; ENMT: Mouth and pharynx normal, Mucous membranes moist; Neck: Supple, Full range of motion, No lymphadenopathy; Cardiovascular: Regular rate and rhythm, No murmur, rub, or gallop; Respiratory: +lungs coarse bilat with bibasilar crackles.  +tachypneic, speaking in short phrases.  Chest: Nontender, Movement normal; Abdomen: Soft, Nontender, Nondistended, Normal bowel sounds; Genitourinary: No CVA tenderness; Extremities: Pulses normal, No tenderness, 2+ pedal edema bilat, No calf asymmetry.; Neuro: AA&Ox3, Major CN grossly intact.  No facial droop, speech clear.  No gross focal motor or sensory deficits in extremities.; Skin: Color normal, Warm, diaphoretic.   ED Course  Procedures  MDM MDM Reviewed: previous chart, nursing note and vitals Reviewed previous: labs and ECG Interpretation: ECG, labs and x-ray Total time providing critical care: 30-74 minutes. This excludes time spent performing separately reportable procedures and services. Consults: primary care provider and admitting MD    03/2009 Cardiac Cath:  "ASSESSMENT:   1. Normal coronary arteries.   2. Normal left ventricular function.   3. Noncardiac chest pain.     PLAN:  A medical therapy, should be stable for discharge home in the morning.    Shaune Pascal. Bensimhon, MD"      Date: 01/12/2011  Rate: 108  Rhythm: sinus tachycardia  QRS Axis: left  Intervals: normal  ST/T Wave  abnormalities: nonspecific ST/T changes  Conduction Disutrbances:right bundle branch block and left anterior fascicular block  Narrative Interpretation: LVH  Old EKG Reviewed: unchanged no signif changes from previous EKG dated 07/04/2009  Results for SELIG, STEUER (MRN [id number]) as of 01/12/2011 ED visit:  Ref. Range 01/12/2011 14:59  Sodium Latest Range: 135-145 mEq/L 131 (L)    Potassium Latest Range: 3.5-5.1 mEq/L 3.0 (L)  Chloride Latest Range: 96-112 mEq/L 96  CO2 Latest Range: 19-32 mEq/L 16 (L)  BUN Latest Range: 6-23 mg/dL 48 (H)  Creat Latest Range: 0.50-1.35 mg/dL 4.58 (H)  Calcium Latest Range: 8.4-10.5 mg/dL 8.8  GFR calc non Af Amer Latest Range: >60 mL/min 13 (L)  GFR calc Af Amer Latest Range: >60 mL/min 16 (L)  Glucose Latest Range: 70-99 mg/dL 122 (H)  CK, MB Latest Range: 0.3-4.0 ng/mL 4.5 (H)  CK Total Latest Range: 7-232 U/L 447 (H)  Troponin I Latest Range: <0.30 ng/mL <0.30  WBC Latest Range: 4.0-10.5 K/uL 16.9 (H)  RBC Latest Range: 4.22-5.81 MIL/uL 3.13 (L)  HGB Latest Range: 13.0-17.0 g/dL 7.8 (L)  HCT Latest Range: 39.0-52.0 % 23.4 (L)  MCV Latest Range: 78.0-100.0 fL 74.8 (L)  MCH Latest Range: 26.0-34.0 pg 24.9 (L)  MCHC Latest Range: 30.0-36.0 g/dL 33.3  RDW Latest Range: 11.5-15.5 % 16.8 (H)  Platelets Latest Range: 150-400 K/uL 421 (H)  Neutrophils Relative Latest Range: 43-77 % 91 (H)  Lymphocytes Relative Latest Range: 12-46 % 6 (L)  Monocytes Relative Latest Range: 3-12 % 3  Eosinophils Relative Latest Range: 0-5 % 0  Basophils Relative Latest Range: 0-1 % 0  Neutrophils Absolute Latest Range: 1.7-7.7 K/uL 15.4 (H)  Lymphocytes Absolute Latest Range: 0.7-4.0 K/uL 1.0  Monocytes Absolute Latest Range: 0.1-1.0 K/uL 0.5  Eosinophils Absolute Latest Range: 0.0-0.5 10e3/uL 0.0  Basophils Absolute Latest Range: 0.0-0.1 K/uL 0.0  RBC Morphology No range found ROULEAUX  WBC Morphology No range found ATYPICAL MONONUCLEAR CELLS  Smear Review No range found LARGE PLATELETS PRESENT  Prothrombin Time Latest Range: 11.6-15.2 seconds 80.3 (H)  INR Latest Range: 0.00-1.49  9.89 (HH)    Dg Chest Portable 1 View  01/12/2011  *RADIOLOGY REPORT*  Clinical Data: Shortness of breath, history lymphoma, diabetes  PORTABLE CHEST - 1 VIEW  Comparison: Portable exam 1520 hours compared to 07/04/2009  Findings: Prominent cardiac silhouette.  Pulmonary vascular congestion. Bibasilar infiltrates. Upper lungs clear. No gross pleural effusion or pneumothorax. Bones unremarkable.  IMPRESSION: Bibasilar infiltrates.  Original Report Authenticated By: Burnetta Sabin, [name]   Results for IMAN, HAGNER (MRN [id number]) as of 01/12/2011 17:28  Ref. Range 09/16/2010 11:01 09/16/2010 11:01 01/12/2011 14:59  BUN Latest Range: 6-23 mg/dL 31 (H) 31 (H) 48 (H)  Creat Latest Range: 0.50-1.35 mg/dL 2.05 (H) 2.05 (H) 4.58 (H)   Results for EMILIANO, HOLLETT (MRN [id number]) as of 01/12/2011 17:28  Ref. Range 02/28/2010 11:17 09/16/2010 11:01 01/12/2011 14:59  HGB Latest Range: 13.0-17.0 g/dL 9.7 (L) 9.9 (L) 7.8 (L)  HCT Latest Range: 39.0-52.0 % 28.7 (L) 30.9 (L) 23.4 (L)    12:40 PM:  States he feels "better" after neb, sitting up off side of stretcher talking with family.  VS remain stable, Sats improved from 90% on arrival to 95-98% on O2 N/C.  INR elevated, but no outward signs of active bleeding at this time.  BNP elevated, no recent old to compare.  Potassium low, will replete PO.  H/H lower than baseline, will type and screen.  BUN/Cr also elevated from  baseline.  Troponin neg, EKG without acute STTW changes.  CXR with bibasilar infiltrates and pulm vasc congestion.  Appears CHF, poss CAP.  Will treat for both at this time (ntg/lasix, rocephin/zithromax).  Dx testing d/w pt and family.  Questions answered.  Verb understanding, agreeable to admit.               12:40 PM:  T/C to Dr. Philip Aspen and Dr. Reynaldo Minium, case discussed, including:  HPI, pertinent PM/SHx, VS/PE, dx testing, ED course and treatment.  Req to please admit to [facility] vs transfer/admit to [facility].    12:40 PM:  T/C to [facility] Dr. Anastasio Champion, case discussed, including:  HPI, pertinent PM/SHx, VS/PE, dx testing, ED course and treatment.  Agreeable to admit.  Requests to write temporary orders, tele bed to team AP2.  [place], DO 01/13/11 1255

## 2011-01-12 NOTE — ED Notes (Signed)
Pt c/o difficulty breathing and cough since Saturday. States that he hasn't been able to get anything up. Respirations labored.

## 2011-01-12 NOTE — ED Notes (Signed)
Lab called crittical value pt 80.3, inr 9.89 dr nptified

## 2011-01-12 NOTE — ED Notes (Signed)
Pt sitting on edge of bed, diaphoretic, fan offered. Pt denies SOB at this time. Noted swelling in feet to be increasing. Dependant edema noted in bilateral lower extremities.

## 2011-01-12 NOTE — ED Notes (Signed)
Pt states does not have to void. He attempted without success.

## 2011-01-12 NOTE — H&P (Signed)
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 Patient reports swelling of the lower extremities on off for the past few then sudden onset of shortness of breath for the past 2 days. Patient says he had episodic headaches and visited the [facility]: Emergency room 3 days a received an injection of Toradol which did not help, then received morphine which relieved the pain and was discharged home. Yesterday the headache returned and the shot along with shortness of breath and he came to [facility] emergency room for assistance. He has also been having increased urinary. No dysuria no hematuria no evidence of trauma  Patient's son reports the patient has been short of breath for a long while but has been in denial.  There is no history of chest pain or syncope. Reevaluated a couple years ago for heart failure and found to abnormal coronary arteries.  Review of Systems:  The patient denies anorexia, fever, weight loss,, vision loss, decreased hearing, hoarseness, chest pain, syncope, , balance deficits, hemoptysis, abdominal pain, melena, hematochezia, severe indigestion/heartburn, hematuria, incontinence, genital sores, muscle weakness, suspicious skin lesions, transient blindness, difficulty walking, depression,, abnormal bleeding, enlarged lymph nodes, angioedema, and breast masses.   other than noted above complete review of systems is unremarkable.  Past Medical History: Past Medical History  Diagnosis Date  . Cancer   . Diabetes mellitus   . DVT (deep venous thrombosis)   . Diabetic nephropathy   . Diabetic retinopathy   . Hypertension   . Hyperlipidemia   . Non Hodgkin's lymphoma    History reviewed. No pertinent past surgical  history.  Medications: Prior to Admission medications   Medication Sig Start Date End Date Taking? Authorizing Provider  amLODipine-benazepril (LOTREL) 5-20 MG per capsule Take 1 capsule by mouth daily.     Yes Historical Provider, MD  insulin lispro (HUMALOG) 100 UNIT/ML injection Inject 10-75 Units into the skin 3 (three) times daily before meals. Inject 75 units every day in the morning and at lunch and inject 10 units at bedtime    Yes Historical Provider, MD  warfarin (COUMADIN) 5 MG tablet Take 5 mg by mouth daily. Take one and one-half (7.5mg ) tablet by mouth on Sundays, Tuesdays, Thursdays, and Saturdays, then take two tablets (10mg ) on Mondays, Wednesdays, and Fridays or take as directed by coumadin clinic.    Yes Historical Provider, MD    Allergies:  No Known Allergies  Social History:  reports that he has never smoked. He does not have any smokeless tobacco history on file. He reports that he does not drink alcohol or use illicit drugs.  Family History: History reviewed. No pertinent family history. family history significant for diabetes and hypertension.  Physical Exam: Filed Vitals:   01/12/11 1700 01/12/11 1740 01/12/11 1800 01/12/11 2035  BP: 150/70 138/60 125/62   Pulse: 103 104 100   Temp:      TempSrc:      Resp:  28    Height:      Weight:      SpO2: 98% 94% 95% 95%   General appearance: alert, cooperative, fatigued and moderately obese Head: Normocephalic, without obvious abnormality, atraumatic Eyes: conjunctivae/corneas clear. PERRL, EOM's intact.  Neck: no adenopathy, no carotid bruit,  symmetrical, trachea midline and thyroid not enlarged, symmetric, no  tenderness/mass/nodules; distended external jugular veins. Back: symmetric, no curvature. ROM normal. No CVA tenderness. Resp: clear to auscultation bilaterally Cardio: regular rate and rhythm, S1, S2 normal, no murmur, click, rub or gallop GI: soft, non-tender; bowel sounds normal; no masses,  no  organomegaly Extremities: edema 2+ bilaterally Skin: Skin color, texture, turgor normal. No rashes or lesions Neurologic: Grossly normal   Labs on Admission:   [facility] 01/12/11 1459  NA 131*  K 3.0*  CL 96  CO2 16*  GLUCOSE 122*  BUN 48*  CREATININE 4.58*  CALCIUM 8.8  MG --  PHOS --      Basename 01/12/11 1459  WBC 16.9*  NEUTROABS 15.4*  HGB 7.8*  HCT 23.4*  MCV 74.8*  PLT 421*    Basename 01/12/11 1459  CKTOTAL 447*  CKMB 4.5*  CKMBINDEX --  TROPONINI <0.30   INR >9   Radiological Exams on Admission: Ct Head Wo Contrast  01/08/2011  *RADIOLOGY REPORT*  Clinical Data: Frontal headaches.  History of lymphoma.  CT HEAD WITHOUT CONTRAST  Technique:  Contiguous axial images were obtained from the base of the skull through the vertex without contrast.  Comparison: PET scan 12/09/2010.  CT head without contrast 07/04/2009.  Findings: No acute intracranial abnormality is present. Specifically, there is no evidence for acute infarct, hemorrhage, mass, hydrocephalus, or extra-axial fluid collection.  The paranasal sinuses and mastoid air cells are clear.  The globes and orbits are intact.  The osseous skull is intact.  IMPRESSION: Negative CT of the head.  Original Report Authenticated By: Resa Miner. MATTERN, [name]   Dg Chest Portable 1 View  01/12/2011  *RADIOLOGY REPORT*  Clinical Data: Shortness of breath, history lymphoma, diabetes  PORTABLE CHEST - 1 VIEW  Comparison: Portable exam 1520 hours compared to 07/04/2009  Findings: Prominent cardiac silhouette. Pulmonary vascular congestion. Bibasilar infiltrates. Upper lungs clear. No gross pleural effusion or pneumothorax. Bones unremarkable.  IMPRESSION: Bibasilar infiltrates.  Original Report Authenticated By: Burnetta Sabin, [name]    Assessment/Plan Present on Admission:   .Renal failure, unspecified : Probably acute on with fluid retention and pumr edema; start high-dose Lasix and consult nephrology for assistance  with management andmay possibly end up needing dialysis temporarily : Will discontinue ACE inhibitors for the time being and discontinue NSAIDs. Check urine lites and phosphorus.   .Hypokalemia: Replete potassium.   Marland KitchenAnemia: Check an anemia panel. Likely anemia of chronic disease.   Marland KitchenShortness of breath dyspnea: Likely related to a renal failure rather than true heart disease.   .Warfarin-induced coagulopathy give 5 mg grams of IV vitamin K and one dose of oral vitamin K intention to reduce his INR to about 2.0 without reversing Coumadin complete.   Marland KitchenDIABETES MELLITUS-TYPE II: Give sliding scale insulin until we discover his new  .Hypertension: Monitor blood pressure and give when necessary treatments until we reach a new baseline.  Other plans as per orders.  Shoni Quijas 01/12/2011, 10:49 PM

## 2011-01-13 ENCOUNTER — Inpatient Hospital Stay (HOSPITAL_COMMUNITY): Payer: Medicare Other

## 2011-01-13 ENCOUNTER — Encounter (HOSPITAL_COMMUNITY): Payer: Self-pay | Admitting: Emergency Medicine

## 2011-01-13 DIAGNOSIS — I517 Cardiomegaly: Secondary | ICD-10-CM

## 2011-01-13 LAB — CBC
HCT: 26.4 % — ABNORMAL LOW (ref 39.0–52.0)
Hemoglobin: 8.7 g/dL — ABNORMAL LOW (ref 13.0–17.0)
MCH: 25.1 pg — ABNORMAL LOW (ref 26.0–34.0)
MCHC: 33 g/dL (ref 30.0–36.0)
MCV: 76.3 fL — ABNORMAL LOW (ref 78.0–100.0)
Platelets: 495 10*3/uL — ABNORMAL HIGH (ref 150–400)
RBC: 3.46 MIL/uL — ABNORMAL LOW (ref 4.22–5.81)
RDW: 17.3 % — ABNORMAL HIGH (ref 11.5–15.5)
WBC: 18.9 10*3/uL — ABNORMAL HIGH (ref 4.0–10.5)

## 2011-01-13 LAB — GLUCOSE, CAPILLARY
Glucose-Capillary: >600 mg/dL (ref 70–99)
Glucose-Capillary: >600 mg/dL (ref 70–99)
Glucose-Capillary: >600 mg/dL (ref 70–99)
Glucose-Capillary: 329 mg/dL — ABNORMAL HIGH (ref 70–99)

## 2011-01-13 LAB — URINALYSIS, ROUTINE W REFLEX MICROSCOPIC
Bilirubin Urine: NEGATIVE
Glucose, UA: >1000 mg/dL — AB
Ketones, ur: NEGATIVE mg/dL
Leukocytes, UA: NEGATIVE
Nitrite: POSITIVE — AB
Protein, ur: 100 mg/dL — AB
Specific Gravity, Urine: 1.025 (ref 1.005–1.030)
Urobilinogen, UA: 0.2 mg/dL (ref 0.0–1.0)
pH: 5.5 (ref 5.0–8.0)

## 2011-01-13 LAB — COMPREHENSIVE METABOLIC PANEL
ALT: 36 U/L (ref 0–53)
AST: 51 U/L — ABNORMAL HIGH (ref 0–37)
Albumin: 2 g/dL — ABNORMAL LOW (ref 3.5–5.2)
Alkaline Phosphatase: 222 U/L — ABNORMAL HIGH (ref 39–117)
BUN: 52 mg/dL — ABNORMAL HIGH (ref 6–23)
CO2: 14 mEq/L — ABNORMAL LOW (ref 19–32)
Calcium: 8.3 mg/dL — ABNORMAL LOW (ref 8.4–10.5)
Chloride: 95 mEq/L — ABNORMAL LOW (ref 96–112)
Creatinine, Ser: 4.73 mg/dL — ABNORMAL HIGH (ref 0.50–1.35)
GFR calc Af Amer: 16 mL/min — ABNORMAL LOW (ref >60)
GFR calc non Af Amer: 13 mL/min — ABNORMAL LOW (ref >60)
Glucose, Bld: 422 mg/dL — ABNORMAL HIGH (ref 70–99)
Potassium: 3.5 mEq/L (ref 3.5–5.1)
Sodium: 129 mEq/L — ABNORMAL LOW (ref 135–145)
Total Bilirubin: 0.5 mg/dL (ref 0.3–1.2)
Total Protein: 7.3 g/dL (ref 6.0–8.3)

## 2011-01-13 LAB — URINE MICROSCOPIC-ADD ON

## 2011-01-13 LAB — IRON AND TIBC
Iron: 20 ug/dL — ABNORMAL LOW (ref 42–135)
Saturation Ratios: 13 % — ABNORMAL LOW (ref 20–55)
TIBC: 155 ug/dL — ABNORMAL LOW (ref 215–435)
UIBC: 135 ug/dL

## 2011-01-13 LAB — HEMOGLOBIN A1C
Hgb A1c MFr Bld: 8.8 % — ABNORMAL HIGH (ref <5.7)
Mean Plasma Glucose: 206 mg/dL — ABNORMAL HIGH (ref <117)

## 2011-01-13 LAB — FOLATE: Folate: 7.8 ng/mL

## 2011-01-13 LAB — URINE CULTURE
Colony Count: NO GROWTH
Culture  Setup Time: 201208140222
Culture: NO GROWTH

## 2011-01-13 LAB — CARDIAC PANEL(CRET KIN+CKTOT+MB+TROPI)
CK, MB: 6.2 ng/mL (ref 0.3–4.0)
CK, MB: 9 ng/mL (ref 0.3–4.0)
Relative Index: 1.4 (ref 0.0–2.5)
Relative Index: 2.2 (ref 0.0–2.5)
Total CK: 432 U/L — ABNORMAL HIGH (ref 7–232)
Total CK: 414 U/L — ABNORMAL HIGH (ref 7–232)
Troponin I: <0.3 ng/mL (ref <0.30)
Troponin I: <0.3 ng/mL (ref <0.30)

## 2011-01-13 LAB — BASIC METABOLIC PANEL
BUN: 64 mg/dL — ABNORMAL HIGH (ref 6–23)
CO2: 15 mEq/L — ABNORMAL LOW (ref 19–32)
Calcium: 9 mg/dL (ref 8.4–10.5)
Chloride: 95 mEq/L — ABNORMAL LOW (ref 96–112)
Creatinine, Ser: 5.06 mg/dL — ABNORMAL HIGH (ref 0.50–1.35)
GFR calc Af Amer: 14 mL/min — ABNORMAL LOW (ref >60)
GFR calc non Af Amer: 12 mL/min — ABNORMAL LOW (ref >60)
Glucose, Bld: 687 mg/dL (ref 70–99)
Potassium: 4.1 mEq/L (ref 3.5–5.1)
Sodium: 129 mEq/L — ABNORMAL LOW (ref 135–145)

## 2011-01-13 LAB — GLUCOSE, RANDOM: Glucose, Bld: 416 mg/dL — ABNORMAL HIGH (ref 70–99)

## 2011-01-13 LAB — MAGNESIUM: Magnesium: 2.3 mg/dL (ref 1.5–2.5)

## 2011-01-13 LAB — RETICULOCYTES
RBC.: 3.27 MIL/uL — ABNORMAL LOW (ref 4.22–5.81)
Retic Count, Absolute: 26.2 10*3/uL (ref 19.0–186.0)
Retic Ct Pct: 0.8 % (ref 0.4–3.1)

## 2011-01-13 LAB — PHOSPHORUS
Phosphorus: 5.2 mg/dL — ABNORMAL HIGH (ref 2.3–4.6)
Phosphorus: 5.1 mg/dL — ABNORMAL HIGH (ref 2.3–4.6)

## 2011-01-13 LAB — PROTIME-INR
INR: 2.01 — ABNORMAL HIGH (ref 0.00–1.49)
Prothrombin Time: 23.1 seconds — ABNORMAL HIGH (ref 11.6–15.2)

## 2011-01-13 LAB — OCCULT BLOOD X 1 CARD TO LAB, STOOL: Fecal Occult Bld: POSITIVE

## 2011-01-13 LAB — VITAMIN B12: Vitamin B-12: >2000 pg/mL — ABNORMAL HIGH (ref 211–911)

## 2011-01-13 LAB — FERRITIN: Ferritin: 817 ng/mL — ABNORMAL HIGH (ref 22–322)

## 2011-01-13 LAB — SODIUM, URINE, RANDOM: Sodium, Ur: 54 mEq/L

## 2011-01-13 MED ORDER — INSULIN ASPART 100 UNIT/ML ~~LOC~~ SOLN
0.0000 [IU] | Freq: Every day | SUBCUTANEOUS | Status: DC
  Administered 2011-01-13: 4 [IU] via SUBCUTANEOUS

## 2011-01-13 MED ORDER — INSULIN ASPART PROT & ASPART (70-30 MIX) 100 UNIT/ML ~~LOC~~ SUSP
40.0000 [IU] | Freq: Two times a day (BID) | SUBCUTANEOUS | Status: DC
  Administered 2011-01-13: 40 [IU] via SUBCUTANEOUS
  Filled 2011-01-13: qty 3

## 2011-01-13 MED ORDER — DEXTROSE 5 % IV SOLN
1.0000 g | INTRAVENOUS | Status: DC
  Administered 2011-01-13 – 2011-01-14 (×2): 1 g via INTRAVENOUS
  Filled 2011-01-13 (×3): qty 1

## 2011-01-13 MED ORDER — SODIUM CHLORIDE 0.9 % IJ SOLN
INTRAMUSCULAR | Status: AC
  Administered 2011-01-13: 3 mL
  Filled 2011-01-13: qty 3

## 2011-01-13 MED ORDER — DEXTROSE 5 % IV SOLN
500.0000 mg | INTRAVENOUS | Status: DC
  Administered 2011-01-13 – 2011-01-14 (×2): 500 mg via INTRAVENOUS
  Filled 2011-01-13 (×3): qty 500

## 2011-01-13 MED ORDER — INSULIN ASPART 100 UNIT/ML ~~LOC~~ SOLN
12.0000 [IU] | SUBCUTANEOUS | Status: AC
  Administered 2011-01-13: 12 [IU] via SUBCUTANEOUS

## 2011-01-13 MED ORDER — INSULIN ASPART 100 UNIT/ML ~~LOC~~ SOLN
20.0000 [IU] | Freq: Once | SUBCUTANEOUS | Status: AC
  Administered 2011-01-13: 20 [IU] via SUBCUTANEOUS

## 2011-01-13 MED ORDER — INSULIN ASPART 100 UNIT/ML ~~LOC~~ SOLN: 0.0000 [IU] | Freq: Three times a day (TID) | SUBCUTANEOUS | Status: DC

## 2011-01-13 MED ORDER — INSULIN ASPART 100 UNIT/ML ~~LOC~~ SOLN
0.0000 [IU] | Freq: Three times a day (TID) | SUBCUTANEOUS | Status: DC
  Administered 2011-01-14: 15 [IU] via SUBCUTANEOUS
  Administered 2011-01-14: 3 [IU] via SUBCUTANEOUS
  Administered 2011-01-14: 8 [IU] via SUBCUTANEOUS
  Administered 2011-01-15: 2 [IU] via SUBCUTANEOUS

## 2011-01-13 MED ORDER — INSULIN ASPART 100 UNIT/ML ~~LOC~~ SOLN
15.0000 [IU] | Freq: Once | SUBCUTANEOUS | Status: AC
  Administered 2011-01-13: 15 [IU] via SUBCUTANEOUS

## 2011-01-13 MED ORDER — INSULIN ASPART PROT & ASPART (70-30 MIX) 100 UNIT/ML ~~LOC~~ SUSP
75.0000 [IU] | Freq: Two times a day (BID) | SUBCUTANEOUS | Status: DC
  Administered 2011-01-13 – 2011-01-14 (×2): 75 [IU] via SUBCUTANEOUS

## 2011-01-13 MED ORDER — SODIUM BICARBONATE 650 MG PO TABS
650.0000 mg | ORAL_TABLET | Freq: Two times a day (BID) | ORAL | Status: DC
  Administered 2011-01-13 – 2011-01-16 (×7): 650 mg via ORAL
  Filled 2011-01-13 (×7): qty 1

## 2011-01-13 NOTE — Progress Notes (Signed)
CRITICAL VALUE ALERT  Critical value received:  CKMB 6.2    Date of notification:  01/13/2011  Time of notification:  0210  Critical value read back:yes  Nurse who received alert:  N. Justine Null, RN  MD notified (1st page):  Dr. Megan Salon   Time of first page:  0210   MD notified (2nd page):  Time of second page:  Responding MD:  Dr. Megan Salon  Time MD responded:  [phone]

## 2011-01-13 NOTE — Progress Notes (Signed)
01/13/11 1320 Patient had critical elevated blood glucose unable to read on glucose monitor. Notified Dr Anastasio Champion, stated to administer insulin 70/30 40 units SQ as ordered and recheck CBG afterward instead of obtaining stat lab glucose to verify. Insulin 70/30 given as ordered. Will monitor.

## 2011-01-13 NOTE — Consult Note (Signed)
Reason for Consult: Renal failure Referring Physician: [facility]  [name] is an 59 y.o. male.  HPI: He her patient he is a 60 years old gentleman wheeze a history of diabetes and hypertension her pain the Hodgkin's lymphoma and presently came with complaints of her shortness of breath orthopnea and paroxysmal nocturnal dyspnea. According to her the the patient he has the swelling of his legs for a positive Homans sign. Presently however is difficult time in increasing. He denies any nausea or vomiting. Patient also denies previous history of renal failure he denies also any history of kidney stone. Patient has history of diabetic retinopathy status post laser treatment bilaterally. However he denies any history of proteinuria.  Past Medical History  Diagnosis Date  . Cancer   . Diabetes mellitus   . DVT (deep venous thrombosis)   . Diabetic nephropathy   . Diabetic retinopathy   . Hypertension   . Hyperlipidemia   . Non Hodgkin's lymphoma     History reviewed. No pertinent past surgical history.  History reviewed. No pertinent family history.  Social History:  reports that he has never smoked. He does not have any smokeless tobacco history on file. He reports that he does not drink alcohol or use illicit drugs.  Allergies: No Known Allergies  Medications: I have reviewed the patient's current medications.  Results for orders placed during the hospital encounter of 01/12/11 (from the past 48 hour(s))  BASIC METABOLIC PANEL     Status: Abnormal   Collection Time   01/12/11  2:59 PM      Component Value Range Comment   Sodium 131 (*) 135 - 145 (mEq/L)    Potassium 3.0 (*) 3.5 - 5.1 (mEq/L)    Chloride 96  96 - 112 (mEq/L)    CO2 16 (*) 19 - 32 (mEq/L)    Glucose, Bld 122 (*) 70 - 99 (mg/dL)    BUN 48 (*) 6 - 23 (mg/dL)    Creatinine, Ser 4.58 (*) 0.50 - 1.35 (mg/dL)    Calcium 8.8  8.4 - 10.5 (mg/dL)    GFR calc non Af Amer 13 (*) >60 (mL/min)    GFR calc Af  Amer 16 (*) >60 (mL/min)   CARDIAC PANEL(CRET KIN+CKTOT+MB+TROPI)     Status: Abnormal   Collection Time   01/12/11  2:59 PM      Component Value Range Comment   Total CK 447 (*) 7 - 232 (U/L)    CK, MB 4.5 (*) 0.3 - 4.0 (ng/mL)    Troponin I <0.30  <0.30 (ng/mL)    Relative Index 1.0  0.0 - 2.5    CBC     Status: Abnormal   Collection Time   01/12/11  2:59 PM      Component Value Range Comment   WBC 16.9 (*) 4.0 - 10.5 (K/uL)    RBC 3.13 (*) 4.22 - 5.81 (MIL/uL)    Hemoglobin 7.8 (*) 13.0 - 17.0 (g/dL)    HCT 23.4 (*) 39.0 - 52.0 (%)    MCV 74.8 (*) 78.0 - 100.0 (fL)    MCH 24.9 (*) 26.0 - 34.0 (pg)    MCHC 33.3  30.0 - 36.0 (g/dL)    RDW 16.8 (*) 11.5 - 15.5 (%)    Platelets 421 (*) 150 - 400 (K/uL)   DIFFERENTIAL     Status: Abnormal   Collection Time   01/12/11  2:59 PM      Component Value Range Comment  Neutrophils Relative 91 (*) 43 - 77 (%)    Lymphocytes Relative 6 (*) 12 - 46 (%)    Monocytes Relative 3  3 - 12 (%)    Eosinophils Relative 0  0 - 5 (%)    Basophils Relative 0  0 - 1 (%)    Neutro Abs 15.4 (*) 1.7 - 7.7 (K/uL)    Lymphs Abs 1.0  0.7 - 4.0 (K/uL)    Monocytes Absolute 0.5  0.1 - 1.0 (K/uL)    Eosinophils Absolute 0.0  0.0 - 0.7 (K/uL)    Basophils Absolute 0.0  0.0 - 0.1 (K/uL)    RBC Morphology ROULEAUX      WBC Morphology ATYPICAL MONONUCLEAR CELLS      Smear Review LARGE PLATELETS PRESENT   GIANT PLATELETS SEEN  PRO B NATRIURETIC PEPTIDE     Status: Abnormal   Collection Time   01/12/11  2:59 PM      Component Value Range Comment   BNP, POC 11112.0 (*) 0 - 125 (pg/mL)   PROTIME-INR     Status: Abnormal   Collection Time   01/12/11  2:59 PM      Component Value Range Comment   Prothrombin Time 80.3 (*) 11.6 - 15.2 (seconds) RESULT REPEATED AND VERIFIED   INR 9.89 (*) 0.00 - 1.49    URINALYSIS, ROUTINE W REFLEX MICROSCOPIC     Status: Abnormal   Collection Time   01/12/11  5:34 PM      Component Value Range Comment   Color, Urine YELLOW   YELLOW     Appearance CLEAR  CLEAR     Specific Gravity, Urine 1.025  1.005 - 1.030     pH 6.0  5.0 - 8.0     Glucose, UA 100 (*) NEGATIVE (mg/dL)    Hgb urine dipstick LARGE (*) NEGATIVE     Bilirubin Urine SMALL (*) NEGATIVE     Ketones, ur NEGATIVE  NEGATIVE (mg/dL)    Protein, ur 100 (*) NEGATIVE (mg/dL)    Urobilinogen, UA 0.2  0.0 - 1.0 (mg/dL)    Nitrite NEGATIVE  NEGATIVE     Leukocytes, UA NEGATIVE  NEGATIVE    URINE MICROSCOPIC-ADD ON     Status: Abnormal   Collection Time   01/12/11  5:34 PM      Component Value Range Comment   Squamous Epithelial / LPF FEW (*) RARE     WBC, UA 0-2  <3 (WBC/hpf)    RBC / HPF 7-10  <3 (RBC/hpf)    Bacteria, UA FEW (*) RARE     Casts GRANULAR CAST (*) NEGATIVE    TYPE AND SCREEN     Status: Normal   Collection Time   01/12/11  7:11 PM      Component Value Range Comment   ABO/RH(D) A POS      Antibody Screen NEG      Sample Expiration 01/15/2011     PHOSPHORUS     Status: Abnormal   Collection Time   01/12/11 10:05 PM      Component Value Range Comment   Phosphorus 5.2 (*) 2.3 - 4.6 (mg/dL)   COMPREHENSIVE METABOLIC PANEL     Status: Abnormal   Collection Time   01/12/11 10:05 PM      Component Value Range Comment   Sodium 129 (*) 135 - 145 (mEq/L)    Potassium 3.5  3.5 - 5.1 (mEq/L)    Chloride 95 (*) 96 - 112 (mEq/L)  CO2 14 (*) 19 - 32 (mEq/L)    Glucose, Bld 422 (*) 70 - 99 (mg/dL)    BUN 52 (*) 6 - 23 (mg/dL)    Creatinine, Ser 4.73 (*) 0.50 - 1.35 (mg/dL)    Calcium 8.3 (*) 8.4 - 10.5 (mg/dL)    Total Protein 7.3  6.0 - 8.3 (g/dL)    Albumin 2.0 (*) 3.5 - 5.2 (g/dL)    AST 51 (*) 0 - 37 (U/L)    ALT 36  0 - 53 (U/L)    Alkaline Phosphatase 222 (*) 39 - 117 (U/L)    Total Bilirubin 0.5  0.3 - 1.2 (mg/dL)    GFR calc non Af Amer 13 (*) >60 (mL/min)    GFR calc Af Amer 16 (*) >60 (mL/min)   MAGNESIUM     Status: Normal   Collection Time   01/12/11 10:41 PM      Component Value Range Comment   Magnesium 2.3  1.5 - 2.5  (mg/dL)   CREATININE, URINE, RANDOM     Status: Normal   Collection Time   01/12/11 11:29 PM      Component Value Range Comment   Creatinine, Urine 72.7     BLOOD GAS, ARTERIAL     Status: Abnormal   Collection Time   01/12/11 11:43 PM      Component Value Range Comment   O2 Content, Ven 3.5      Delivery systems NASAL CANNULA      pH, Arterial 7.366  7.350 - 7.450     pCO2 26.8 (*) 35.0 - 45.0 (mmHg)    pO2, Arterial 93.2  80.0 - 100.0 (mmHg)    Bicarbonate 14.9 (*) 20.0 - 24.0 (mEq/L)    TCO2 14.3  0 - 100 (mmol/L)    Acid-base deficit 9.4 (*) 0.0 - 2.0 (mmol/L)    O2 Saturation 96.3      Patient temperature 37.0      Collection site LEFT RADIAL      Drawn [phone]      Sample type ARTERIAL      Allens test (pass/fail) PASS  PASS    GLUCOSE, CAPILLARY     Status: Abnormal   Collection Time   01/12/11 11:52 PM      Component Value Range Comment   Glucose-Capillary 466 (*) 70 - 99 (mg/dL)   GLUCOSE, CAPILLARY     Status: Abnormal   Collection Time   01/12/11 11:56 PM      Component Value Range Comment   Glucose-Capillary 460 (*) 70 - 99 (mg/dL)   GLUCOSE, RANDOM     Status: Abnormal   Collection Time   01/12/11 11:58 PM      Component Value Range Comment   Glucose, Bld 416 (*) 70 - 99 (mg/dL)   CARDIAC PANEL(CRET KIN+CKTOT+MB+TROPI)     Status: Abnormal   Collection Time   01/13/11 12:00 AM      Component Value Range Comment   Total CK 432 (*) 7 - 232 (U/L)    CK, MB 6.2 (*) 0.3 - 4.0 (ng/mL)    Troponin I <0.30  <0.30 (ng/mL)    Relative Index 1.4  0.0 - 2.5    OCCULT BLOOD X 1 CARD TO LAB, STOOL     Status: Normal   Collection Time   01/13/11  6:03 AM      Component Value Range Comment   Fecal Occult Bld POSITIVE     GLUCOSE, CAPILLARY  Status: Abnormal   Collection Time   01/13/11  7:22 AM      Component Value Range Comment   Glucose-Capillary >600 (*) 70 - 99 (mg/dL)   CARDIAC PANEL(CRET KIN+CKTOT+MB+TROPI)     Status: Abnormal   Collection Time   01/13/11   8:08 AM      Component Value Range Comment   Total CK 414 (*) 7 - 232 (U/L)    CK, MB 9.0 (*) 0.3 - 4.0 (ng/mL)    Troponin I <0.30  <0.30 (ng/mL)    Relative Index 2.2  0.0 - 2.5    RETICULOCYTES     Status: Abnormal   Collection Time   01/13/11  8:08 AM      Component Value Range Comment   Retic Ct Pct 0.8  0.4 - 3.1 (%)    RBC. 3.27 (*) 4.22 - 5.81 (MIL/uL)    Retic Count, Manual 26.2  19.0 - 186.0 (K/uL)   BASIC METABOLIC PANEL     Status: Abnormal   Collection Time   01/13/11  8:20 AM      Component Value Range Comment   Sodium 129 (*) 135 - 145 (mEq/L)    Potassium 4.1  3.5 - 5.1 (mEq/L)    Chloride 95 (*) 96 - 112 (mEq/L)    CO2 15 (*) 19 - 32 (mEq/L)    Glucose, Bld 687 (*) 70 - 99 (mg/dL)    BUN 64 (*) 6 - 23 (mg/dL)    Creatinine, Ser 5.06 (*) 0.50 - 1.35 (mg/dL)    Calcium 9.0  8.4 - 10.5 (mg/dL)    GFR calc non Af Amer 12 (*) >60 (mL/min)    GFR calc Af Amer 14 (*) >60 (mL/min)   CBC     Status: Abnormal   Collection Time   01/13/11  8:20 AM      Component Value Range Comment   WBC 18.9 (*) 4.0 - 10.5 (K/uL)    RBC 3.46 (*) 4.22 - 5.81 (MIL/uL)    Hemoglobin 8.7 (*) 13.0 - 17.0 (g/dL)    HCT 26.4 (*) 39.0 - 52.0 (%)    MCV 76.3 (*) 78.0 - 100.0 (fL)    MCH 25.1 (*) 26.0 - 34.0 (pg)    MCHC 33.0  30.0 - 36.0 (g/dL)    RDW 17.3 (*) 11.5 - 15.5 (%)    Platelets 495 (*) 150 - 400 (K/uL)   PROTIME-INR     Status: Abnormal   Collection Time   01/13/11  8:20 AM      Component Value Range Comment   Prothrombin Time 23.1 (*) 11.6 - 15.2 (seconds)    INR 2.01 (*) 0.00 - 1.49    PHOSPHORUS     Status: Abnormal   Collection Time   01/13/11  8:20 AM      Component Value Range Comment   Phosphorus 5.1 (*) 2.3 - 4.6 (mg/dL)     Dg Chest Portable 1 View  01/12/2011  *RADIOLOGY REPORT*  Clinical Data: Shortness of breath, history lymphoma, diabetes  PORTABLE CHEST - 1 VIEW  Comparison: Portable exam 1520 hours compared to 07/04/2009  Findings: Prominent cardiac silhouette.  Pulmonary vascular congestion. Bibasilar infiltrates. Upper lungs clear. No gross pleural effusion or pneumothorax. Bones unremarkable.  IMPRESSION: Bibasilar infiltrates.  Original Report Authenticated By: Burnetta Sabin, [name]    Review of Systems  Constitutional: Negative for fever.  Respiratory: Positive for shortness of breath and wheezing.   Cardiovascular: Positive for orthopnea, leg swelling  and PND.  Gastrointestinal: Negative for nausea and vomiting.  Genitourinary: Negative for dysuria and flank pain.  Neurological: Positive for weakness and headaches.   Blood pressure 140/74, pulse 94, temperature 98.8 F (37.1 C), temperature source Oral, resp. rate 20, height 6' 3.5" (1.918 m), weight 142.2 kg (313 lb 7.9 oz), SpO2 94.00%. Physical Exam  Constitutional: He is oriented to person, place, and time.  HENT:  Head: Normocephalic.  Eyes: Pupils are equal, round, and reactive to light. No scleral icterus.  Neck: JVD present.  Cardiovascular: Normal rate, regular rhythm and normal heart sounds.  Exam reveals no gallop and no friction rub.   No murmur heard. Musculoskeletal: He exhibits edema.  Neurological: He is alert and oriented to person, place, and time.  Skin: Skin is dry.    Assessment/Plan: Problem #1 renal failure has this moment or sure whether this is acute on chronic. He is pending creatinine seems to be increasing. Since the patient has history of her diabetes diabetic neuropathy and to be entertained as a possibility. However since patient has history of migraine and has been taking Aleve about 4-5 tablets a day for the last week. Hence nonsteroidal-induced acute renal failure also and to be considered as a possibility and. Lastly since patient has been treated with chemotherapy for this moment the her for that also probably many her course and issues as per the kidneys are concerend. As the patient however denies any nausea vomiting at his side he doesn't seem to have any  uremic sinus symptoms. Problem #2 history of diabetes long-standing  Problem #3 history of her diabetic retinopathy status post laser treatment bilaterally. Problem #4 history of lymphoma is status post chemotherapy according to the patient and presently he is in remission Problem #5 history of hypertension his blood pressure seems to be controlled very well. Problem #6 history of  right leg DVT. Problem #7 her history or for CVA Problem #8 history of seizure disorder Problem #9 history of CHF presently is on Lasix 100 mg IV twice a day patient has 1500 cc of urine since   he is improving. Problem #10. History of hypothyroidism  Recommendation I will do her ultrasound of the kidneys if her patient didn't have any previous workup We'll do 24-hour urine for protein and creatinine clearance We'll check her hepatitis B surface antigen hepatitis C antibody and also will check ANA and complement. We'll check also iron studies as patient seems to have anemia and I will follow his basic metabolic panel and CBC. This is end of her her consult thank you  [facility] S 01/13/2011, 10:31 AM

## 2011-01-13 NOTE — Progress Notes (Signed)
Subjective: This man was admitted yesterday with dyspnea for the last 2 days. He also has had swelling of both his legs for least the last week or so. He denies a cough , fever or hemoptysis. 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 He goes to see the oncologist and has received PET scans. Dr Hinda Lenis, nephrology, has seen him already today and has started him on high dose intravenous Lasix. Interestingly his chest x-ray also is suggestive of infiltrates indicative of infection. He has had an echocardiogram this morning and we will await the result.          Physical Exam: Blood pressure 140/74, pulse 94, temperature 98.8 F (37.1 C), temperature source Oral, resp. rate 20, height 6' 3.5" (1.918 m), weight 142.2 kg (313 lb 7.9 oz), SpO2 94.00%. Here she does looks systemically well and is not in any acute distress. He does not have increased work of breathing. There is no peripheral or central cyanosis. There is no clubbing. There is no neck or supraclavicular lymphadenopathy. Cardiovascular: Heart sounds are present and normal without murmurs. There is no gallop rhythm. Jugular venous pressure not raised. Respiratory: Lung fields are clear except for some inspiratory crackles at the left mid and lower zones. There is no bronchial breathing. There is no wheezing. His abdomen is soft and nontender. Neurological: He is alert and orientated without any focal neurological signs.   Investigations: Results for orders placed during the hospital encounter of 01/12/11 (from the past 48 hour(s))  BASIC METABOLIC PANEL     Status: Abnormal   Collection Time   01/12/11  2:59 PM      Component Value Range Comment   Sodium 131 (*) 135 - 145 (mEq/L)    Potassium 3.0 (*) 3.5 - 5.1 (mEq/L)    Chloride 96  96 - 112 (mEq/L)    CO2 16 (*) 19 - 32 (mEq/L)    Glucose, Bld 122 (*) 70 - 99 (mg/dL)    BUN 48 (*) 6 - 23  (mg/dL)    Creatinine, Ser 4.58 (*) 0.50 - 1.35 (mg/dL)    Calcium 8.8  8.4 - 10.5 (mg/dL)    GFR calc non Af Amer 13 (*) >60 (mL/min)    GFR calc Af Amer 16 (*) >60 (mL/min)   CARDIAC PANEL(CRET KIN+CKTOT+MB+TROPI)     Status: Abnormal   Collection Time   01/12/11  2:59 PM      Component Value Range Comment   Total CK 447 (*) 7 - 232 (U/L)    CK, MB 4.5 (*) 0.3 - 4.0 (ng/mL)    Troponin I <0.30  <0.30 (ng/mL)    Relative Index 1.0  0.0 - 2.5    CBC     Status: Abnormal   Collection Time   01/12/11  2:59 PM      Component Value Range Comment   WBC 16.9 (*) 4.0 - 10.5 (K/uL)    RBC 3.13 (*) 4.22 - 5.81 (MIL/uL)    Hemoglobin 7.8 (*) 13.0 - 17.0 (g/dL)    HCT 23.4 (*) 39.0 - 52.0 (%)    MCV 74.8 (*) 78.0 - 100.0 (fL)    MCH 24.9 (*) 26.0 - 34.0 (pg)    MCHC 33.3  30.0 - 36.0 (g/dL)    RDW 16.8 (*) 11.5 - 15.5 (%)    Platelets 421 (*) 150 - 400 (K/uL)   DIFFERENTIAL  Status: Abnormal   Collection Time   01/12/11  2:59 PM      Component Value Range Comment   Neutrophils Relative 91 (*) 43 - 77 (%)    Lymphocytes Relative 6 (*) 12 - 46 (%)    Monocytes Relative 3  3 - 12 (%)    Eosinophils Relative 0  0 - 5 (%)    Basophils Relative 0  0 - 1 (%)    Neutro Abs 15.4 (*) 1.7 - 7.7 (K/uL)    Lymphs Abs 1.0  0.7 - 4.0 (K/uL)    Monocytes Absolute 0.5  0.1 - 1.0 (K/uL)    Eosinophils Absolute 0.0  0.0 - 0.7 (K/uL)    Basophils Absolute 0.0  0.0 - 0.1 (K/uL)    RBC Morphology ROULEAUX      WBC Morphology ATYPICAL MONONUCLEAR CELLS      Smear Review LARGE PLATELETS PRESENT   GIANT PLATELETS SEEN  PRO B NATRIURETIC PEPTIDE     Status: Abnormal   Collection Time   01/12/11  2:59 PM      Component Value Range Comment   BNP, POC 11112.0 (*) 0 - 125 (pg/mL)   PROTIME-INR     Status: Abnormal   Collection Time   01/12/11  2:59 PM      Component Value Range Comment   Prothrombin Time 80.3 (*) 11.6 - 15.2 (seconds) RESULT REPEATED AND VERIFIED   INR 9.89 (*) 0.00 - 1.49    URINALYSIS,  ROUTINE W REFLEX MICROSCOPIC     Status: Abnormal   Collection Time   01/12/11  5:34 PM      Component Value Range Comment   Color, Urine YELLOW  YELLOW     Appearance CLEAR  CLEAR     Specific Gravity, Urine 1.025  1.005 - 1.030     pH 6.0  5.0 - 8.0     Glucose, UA 100 (*) NEGATIVE (mg/dL)    Hgb urine dipstick LARGE (*) NEGATIVE     Bilirubin Urine SMALL (*) NEGATIVE     Ketones, ur NEGATIVE  NEGATIVE (mg/dL)    Protein, ur 100 (*) NEGATIVE (mg/dL)    Urobilinogen, UA 0.2  0.0 - 1.0 (mg/dL)    Nitrite NEGATIVE  NEGATIVE     Leukocytes, UA NEGATIVE  NEGATIVE    URINE MICROSCOPIC-ADD ON     Status: Abnormal   Collection Time   01/12/11  5:34 PM      Component Value Range Comment   Squamous Epithelial / LPF FEW (*) RARE     WBC, UA 0-2  <3 (WBC/hpf)    RBC / HPF 7-10  <3 (RBC/hpf)    Bacteria, UA FEW (*) RARE     Casts GRANULAR CAST (*) NEGATIVE    TYPE AND SCREEN     Status: Normal   Collection Time   01/12/11  7:11 PM      Component Value Range Comment   ABO/RH(D) A POS      Antibody Screen NEG      Sample Expiration 01/15/2011     PHOSPHORUS     Status: Abnormal   Collection Time   01/12/11 10:05 PM      Component Value Range Comment   Phosphorus 5.2 (*) 2.3 - 4.6 (mg/dL)   COMPREHENSIVE METABOLIC PANEL     Status: Abnormal   Collection Time   01/12/11 10:05 PM      Component Value Range Comment   Sodium 129 (*) 135 - 145 (  mEq/L)    Potassium 3.5  3.5 - 5.1 (mEq/L)    Chloride 95 (*) 96 - 112 (mEq/L)    CO2 14 (*) 19 - 32 (mEq/L)    Glucose, Bld 422 (*) 70 - 99 (mg/dL)    BUN 52 (*) 6 - 23 (mg/dL)    Creatinine, Ser 4.73 (*) 0.50 - 1.35 (mg/dL)    Calcium 8.3 (*) 8.4 - 10.5 (mg/dL)    Total Protein 7.3  6.0 - 8.3 (g/dL)    Albumin 2.0 (*) 3.5 - 5.2 (g/dL)    AST 51 (*) 0 - 37 (U/L)    ALT 36  0 - 53 (U/L)    Alkaline Phosphatase 222 (*) 39 - 117 (U/L)    Total Bilirubin 0.5  0.3 - 1.2 (mg/dL)    GFR calc non Af Amer 13 (*) >60 (mL/min)    GFR calc Af Amer 16 (*)  >60 (mL/min)   MAGNESIUM     Status: Normal   Collection Time   01/12/11 10:41 PM      Component Value Range Comment   Magnesium 2.3  1.5 - 2.5 (mg/dL)   CREATININE, URINE, RANDOM     Status: Normal   Collection Time   01/12/11 11:29 PM      Component Value Range Comment   Creatinine, Urine 72.7     BLOOD GAS, ARTERIAL     Status: Abnormal   Collection Time   01/12/11 11:43 PM      Component Value Range Comment   O2 Content, Ven 3.5      Delivery systems NASAL CANNULA      pH, Arterial 7.366  7.350 - 7.450     pCO2 26.8 (*) 35.0 - 45.0 (mmHg)    pO2, Arterial 93.2  80.0 - 100.0 (mmHg)    Bicarbonate 14.9 (*) 20.0 - 24.0 (mEq/L)    TCO2 14.3  0 - 100 (mmol/L)    Acid-base deficit 9.4 (*) 0.0 - 2.0 (mmol/L)    O2 Saturation 96.3      Patient temperature 37.0      Collection site LEFT RADIAL      Drawn by [phone]      Sample type ARTERIAL      Allens test (pass/fail) PASS  PASS    GLUCOSE, CAPILLARY     Status: Abnormal   Collection Time   01/12/11 11:52 PM      Component Value Range Comment   Glucose-Capillary 466 (*) 70 - 99 (mg/dL)   GLUCOSE, CAPILLARY     Status: Abnormal   Collection Time   01/12/11 11:56 PM      Component Value Range Comment   Glucose-Capillary 460 (*) 70 - 99 (mg/dL)   GLUCOSE, RANDOM     Status: Abnormal   Collection Time   01/12/11 11:58 PM      Component Value Range Comment   Glucose, Bld 416 (*) 70 - 99 (mg/dL)   CARDIAC PANEL(CRET KIN+CKTOT+MB+TROPI)     Status: Abnormal   Collection Time   01/13/11 12:00 AM      Component Value Range Comment   Total CK 432 (*) 7 - 232 (U/L)    CK, MB 6.2 (*) 0.3 - 4.0 (ng/mL)    Troponin I <0.30  <0.30 (ng/mL)    Relative Index 1.4  0.0 - 2.5    OCCULT BLOOD X 1 CARD TO LAB, STOOL     Status: Normal   Collection Time   01/13/11  6:03 AM  Component Value Range Comment   Fecal Occult Bld POSITIVE     GLUCOSE, CAPILLARY     Status: Abnormal   Collection Time   01/13/11  7:22 AM      Component Value Range  Comment   Glucose-Capillary >600 (*) 70 - 99 (mg/dL)   CARDIAC PANEL(CRET KIN+CKTOT+MB+TROPI)     Status: Abnormal   Collection Time   01/13/11  8:08 AM      Component Value Range Comment   Total CK 414 (*) 7 - 232 (U/L)    CK, MB 9.0 (*) 0.3 - 4.0 (ng/mL)    Troponin I <0.30  <0.30 (ng/mL)    Relative Index 2.2  0.0 - 2.5    RETICULOCYTES     Status: Abnormal   Collection Time   01/13/11  8:08 AM      Component Value Range Comment   Retic Ct Pct 0.8  0.4 - 3.1 (%)    RBC. 3.27 (*) 4.22 - 5.81 (MIL/uL)    Retic Count, Manual 26.2  19.0 - 186.0 (K/uL)   BASIC METABOLIC PANEL     Status: Abnormal   Collection Time   01/13/11  8:20 AM      Component Value Range Comment   Sodium 129 (*) 135 - 145 (mEq/L)    Potassium 4.1  3.5 - 5.1 (mEq/L)    Chloride 95 (*) 96 - 112 (mEq/L)    CO2 15 (*) 19 - 32 (mEq/L)    Glucose, Bld 687 (*) 70 - 99 (mg/dL)    BUN 64 (*) 6 - 23 (mg/dL)    Creatinine, Ser 5.06 (*) 0.50 - 1.35 (mg/dL)    Calcium 9.0  8.4 - 10.5 (mg/dL)    GFR calc non Af Amer 12 (*) >60 (mL/min)    GFR calc Af Amer 14 (*) >60 (mL/min)   CBC     Status: Abnormal   Collection Time   01/13/11  8:20 AM      Component Value Range Comment   WBC 18.9 (*) 4.0 - 10.5 (K/uL)    RBC 3.46 (*) 4.22 - 5.81 (MIL/uL)    Hemoglobin 8.7 (*) 13.0 - 17.0 (g/dL)    HCT 26.4 (*) 39.0 - 52.0 (%)    MCV 76.3 (*) 78.0 - 100.0 (fL)    MCH 25.1 (*) 26.0 - 34.0 (pg)    MCHC 33.0  30.0 - 36.0 (g/dL)    RDW 17.3 (*) 11.5 - 15.5 (%)    Platelets 495 (*) 150 - 400 (K/uL)   PROTIME-INR     Status: Abnormal   Collection Time   01/13/11  8:20 AM      Component Value Range Comment   Prothrombin Time 23.1 (*) 11.6 - 15.2 (seconds)    INR 2.01 (*) 0.00 - 1.49    PHOSPHORUS     Status: Abnormal   Collection Time   01/13/11  8:20 AM      Component Value Range Comment   Phosphorus 5.1 (*) 2.3 - 4.6 (mg/dL)    No results found for this or any previous visit (from the past 240 hour(s)).  Dg Chest Portable 1  View  01/12/2011  *RADIOLOGY REPORT*  Clinical Data: Shortness of breath, history lymphoma, diabetes  PORTABLE CHEST - 1 VIEW  Comparison: Portable exam 1520 hours compared to 07/04/2009  Findings: Prominent cardiac silhouette. Pulmonary vascular congestion. Bibasilar infiltrates. Upper lungs clear. No gross pleural effusion or pneumothorax. Bones unremarkable.  IMPRESSION: Bibasilar infiltrates.  Original Report  Authenticated By: Burnetta Sabin, [name]      Medications: I have reviewed the patient's current medications.  Impression: 1. Fluid overload/congestive heart failure. 2. Left lung pneumonia, community acquired. 3. Acute on chronic renal failure. 4. Uncontrolled type 2 diabetes mellitus, insulin-dependent. 5. Anemia related to chronic kidney disease. However, fecal occult blood is also positive. 6. Over anticoagulation with Coumadin for DVT. Current INR is in the therapeutic range. 7. Hyponatremia, likely related to diuretic therapy versus fluid overload. 8. History of lymphoma.     Plan: 1. Continue with intravenous diuresis. Await echocardiogram report. He may need cardiology consultation depending on results of echocardiogram. 2. Continue with intravenous antibiotics. CT scan of the chest without contrast to further delineate the infiltrate. Since he has a history of lymphoma, I'm concerned that there may be recurrence. 3. GI consult for fecal occult blood positive. I doubt this is of much significance as he does not clinically have a GI bleed. I think their input would be appreciated however. 4. Start scheduled insulin 70/30 combination twice a day. 5. I've discussed with the patient regarding possible transfer to [facility] as he had mentioned it previously. I have contacted the patient's primary care office, Dr. Burnard Bunting, and am awaiting a call back. It would be interesting to see what his renal function was in Dr. Jacquiline Doe office. Please note that the patient is  agreeable to staying at at this hospital until discharge.     LOS: 1 day   Hecla C 01/13/2011, 11:30 AM

## 2011-01-13 NOTE — Progress Notes (Signed)
Dr. Megan Salon called with TO to D/C IV potassium x2 runs. Will not give medication.

## 2011-01-13 NOTE — Progress Notes (Signed)
CRITICAL VALUE ALERT  Critical value received:  Blood glucose 687 and CKMB 9.0  Date of notification:  01/13/11  Time of notification:  0915  Critical value read back:yes  Nurse who received alert:  Dorinda Hill  MD notified (1st page):  Dr Anastasio Champion  Time of first page:  0916  MD notified (2nd page):  Time of second page:  Responding MD:  Dr Anastasio Champion  Time MD responded:  [phone]

## 2011-01-13 NOTE — Progress Notes (Signed)
01/13/11 1715 Patient had critical high CBG >600 this evening. Notified Dr Anastasio Champion. Stated would increase novolog insulin and increase 70/30 insulin doses for this evening. Stat blood glucose not necessary. Orders placed per Dr Anastasio Champion. Novolog 20 units SG and 70/30 insulin 75 units SQ given this evening as ordered. Nursing staff to continue to monitor.

## 2011-01-13 NOTE — Progress Notes (Signed)
*  PRELIMINARY RESULTS* Echocardiogram 2D Echocardiogram has been performed.  Tera Partridge 01/13/2011, 9:31 AM

## 2011-01-13 NOTE — Progress Notes (Signed)
Notified Dr. Megan Salon of pts lab verified glucose. Orders given.

## 2011-01-14 LAB — BASIC METABOLIC PANEL
BUN: 79 mg/dL — ABNORMAL HIGH (ref 6–23)
CO2: 16 mEq/L — ABNORMAL LOW (ref 19–32)
Calcium: 9.2 mg/dL (ref 8.4–10.5)
Chloride: 98 mEq/L (ref 96–112)
Creatinine, Ser: 4.44 mg/dL — ABNORMAL HIGH (ref 0.50–1.35)
GFR calc Af Amer: 17 mL/min — ABNORMAL LOW (ref >60)
GFR calc non Af Amer: 14 mL/min — ABNORMAL LOW (ref >60)
Glucose, Bld: 371 mg/dL — ABNORMAL HIGH (ref 70–99)
Potassium: 3.3 mEq/L — ABNORMAL LOW (ref 3.5–5.1)
Sodium: 133 mEq/L — ABNORMAL LOW (ref 135–145)

## 2011-01-14 LAB — CREATININE CLEARANCE, URINE, 24 HOUR
Collection Interval-CRCL: 24 hours
Creatinine Clearance: 40 mL/min — ABNORMAL LOW (ref 75–125)
Creatinine, 24H Ur: 2585 mg/d — ABNORMAL HIGH (ref 800–2000)
Creatinine, Urine: 58.1 mg/dL
Creatinine: 4.44 mg/dL — ABNORMAL HIGH (ref 0.50–1.35)
Urine Total Volume-CRCL: 4450 mL

## 2011-01-14 LAB — LIPID PANEL
Cholesterol: 129 mg/dL (ref 0–200)
HDL: 13 mg/dL — ABNORMAL LOW (ref >39)
LDL Cholesterol: 89 mg/dL (ref 0–99)
Total CHOL/HDL Ratio: 9.9 RATIO
Triglycerides: 137 mg/dL (ref <150)
VLDL: 27 mg/dL (ref 0–40)

## 2011-01-14 LAB — HEPATITIS C ANTIBODY: HCV Ab: NEGATIVE

## 2011-01-14 LAB — GLUCOSE, CAPILLARY
Glucose-Capillary: 384 mg/dL — ABNORMAL HIGH (ref 70–99)
Glucose-Capillary: 257 mg/dL — ABNORMAL HIGH (ref 70–99)
Glucose-Capillary: 156 mg/dL — ABNORMAL HIGH (ref 70–99)

## 2011-01-14 LAB — PROTEIN, URINE, 24 HOUR
Collection Interval-UPROT: 24 hours
Protein, 24H Urine: 7521 mg/d — ABNORMAL HIGH (ref 50–100)
Urine Total Volume-UPROT: 4450 mL

## 2011-01-14 LAB — TSH: TSH: 0.155 u[IU]/mL — ABNORMAL LOW (ref 0.350–4.500)

## 2011-01-14 LAB — CBC
HCT: 24.3 % — ABNORMAL LOW (ref 39.0–52.0)
Hemoglobin: 8.4 g/dL — ABNORMAL LOW (ref 13.0–17.0)
MCH: 25.8 pg — ABNORMAL LOW (ref 26.0–34.0)
MCHC: 34.6 g/dL (ref 30.0–36.0)
MCV: 74.5 fL — ABNORMAL LOW (ref 78.0–100.0)
Platelets: 527 10*3/uL — ABNORMAL HIGH (ref 150–400)
RBC: 3.26 MIL/uL — ABNORMAL LOW (ref 4.22–5.81)
RDW: 17.2 % — ABNORMAL HIGH (ref 11.5–15.5)
WBC: 23 10*3/uL — ABNORMAL HIGH (ref 4.0–10.5)

## 2011-01-14 LAB — PROTIME-INR
INR: 1.73 — ABNORMAL HIGH (ref 0.00–1.49)
Prothrombin Time: 20.6 seconds — ABNORMAL HIGH (ref 11.6–15.2)

## 2011-01-14 LAB — HEPATITIS B SURFACE ANTIGEN: Hepatitis B Surface Ag: NEGATIVE

## 2011-01-14 LAB — MAGNESIUM: Magnesium: 2.4 mg/dL (ref 1.5–2.5)

## 2011-01-14 LAB — SEDIMENTATION RATE: Sed Rate: 138 mm/hr — ABNORMAL HIGH (ref 0–16)

## 2011-01-14 LAB — ANTISTREPTOLYSIN O TITER: ASO: <25 IU/mL (ref 0–408)

## 2011-01-14 MED ORDER — PANTOPRAZOLE SODIUM 40 MG PO TBEC
40.0000 mg | DELAYED_RELEASE_TABLET | Freq: Every day | ORAL | Status: DC
  Administered 2011-01-14 – 2011-01-16 (×3): 40 mg via ORAL
  Filled 2011-01-14 (×3): qty 1

## 2011-01-14 MED ORDER — MOXIFLOXACIN HCL IN NACL 400 MG/250ML IV SOLN
400.0000 mg | INTRAVENOUS | Status: DC
  Administered 2011-01-14 – 2011-01-15 (×2): 400 mg via INTRAVENOUS
  Filled 2011-01-14 (×5): qty 250

## 2011-01-14 MED ORDER — INSULIN ASPART PROT & ASPART (70-30 MIX) 100 UNIT/ML ~~LOC~~ SUSP
78.0000 [IU] | Freq: Two times a day (BID) | SUBCUTANEOUS | Status: DC
  Administered 2011-01-14 – 2011-01-16 (×4): 78 [IU] via SUBCUTANEOUS
  Filled 2011-01-14: qty 3

## 2011-01-14 MED ORDER — SODIUM CHLORIDE 0.9 % IJ SOLN
INTRAMUSCULAR | Status: AC
  Administered 2011-01-14: 10 mL
  Filled 2011-01-14: qty 10

## 2011-01-14 MED ORDER — PANTOPRAZOLE SODIUM 40 MG PO TBEC: 40.0000 mg | DELAYED_RELEASE_TABLET | Freq: Every day | ORAL | Status: DC

## 2011-01-14 MED ORDER — WARFARIN SODIUM 5 MG PO TABS
5.0000 mg | ORAL_TABLET | Freq: Once | ORAL | Status: AC
  Administered 2011-01-14: 5 mg via ORAL
  Filled 2011-01-14: qty 1

## 2011-01-14 MED ORDER — POTASSIUM CHLORIDE CRYS ER 20 MEQ PO TBCR
40.0000 meq | EXTENDED_RELEASE_TABLET | ORAL | Status: AC
  Administered 2011-01-14 (×2): 40 meq via ORAL
  Filled 2011-01-14 (×2): qty 2

## 2011-01-14 NOTE — Progress Notes (Signed)
Subjective: He states his shortness of breath has improved. Says the swelling is about the same.  Objective: Vital signs in last 24 hours: Temp:  [97.7 F (36.5 C)-98.5 F (36.9 C)] 98.5 F (36.9 C) (08/15 1000) Pulse Rate:  [88-101] 100  (08/15 1000) Resp:  [20] 20  (08/15 1000) BP: (109-146)/(69-85) 138/77 mmHg (08/15 1000) SpO2:  [94 %-98 %] 95 % (08/15 1000) Weight change:  Last BM Date: 01/13/11  Intake/Output from previous day: 08/14 0701 - 08/15 0700 In: 1436 [P.O.:1080; I.V.:6; IV Piggyback:350] Out: 2175 [Urine:2175]   Physical Exam: General: Alert, awake, oriented x3, in no acute distress. HEENT: No bruits, no goiter. Heart: Regular rate and rhythm, without murmurs, rubs, gallops. Lungs: Bibasilar crackles. Abdomen: Soft, nontender, nondistended, positive bowel sounds. Extremities: 3+ pitting edema up to mid thighs bilaterally. Neuro: Grossly intact, nonfocal.    Lab Results:  [facility] 01/14/11 0436 01/13/11 0820  WBC 23.0* 18.9*  HGB 8.4* 8.7*  HCT 24.3* 26.4*  PLT 527* 495*    Basename 01/14/11 0436 01/13/11 0820  NA 133* 129*  K 3.3* 4.1  CL 98 95*  CO2 16* 15*  GLUCOSE 371* 687*  BUN 79* 64*  CREATININE 4.44* 5.06*  CALCIUM 9.2 9.0    Studies/Results: Ct Chest Wo Contrast  01/13/2011  *RADIOLOGY REPORT*  Clinical Data: Pulmonary infiltrates, history lymphoma  CT CHEST WITHOUT CONTRAST  Technique:  Multidetector CT imaging of the chest was performed following the standard protocol without IV contrast.  Comparison: 12/09/2010 Correlation:  Chest radiograph 01/12/2011  Findings: Mild atherosclerotic calcification aorta. Scattered normal-sized mediastinal lymph nodes. No definite thoracic adenopathy. Respiratory motion artifacts notably at lung bases and upper abdomen. Visualized portion of upper abdomen grossly unremarkable. Mild bilateral gynecomastia. Airspace consolidation in bilateral lower lobes question pneumonia versus aspiration. Minimal  infiltrate identified at right middle lobe and in lingula image 32. Remaining upper lobes lobes clear bilaterally. No gross pulmonary mass or pleural effusion. Probable bone island within a thoracic vertebra unchanged since prior CT.  IMPRESSION: Bilateral lower lobe pulmonary infiltrates most consistent with pneumonia, though aspiration could cause a similar appearance. Mild bilateral gynecomastia, nonspecific, can be seen from multiple etiologies including medications. Remainder of exam unremarkable.  Original Report Authenticated By: Burnetta Sabin, [name]   US Renal  01/13/2011  *RADIOLOGY REPORT*  Clinical Data: Renal failure, diabetes, hypertension  RENAL/URINARY TRACT ULTRASOUND COMPLETE  Comparison:  Abdomen ultrasound 10/26/2008  Findings:  Right Kidney:  12.5 cm length.  Grossly normal cortical thickness and echogenicity.  Degradation of image quality secondary to body habitus.  No gross mass, hydronephrosis or shadowing calcification.  Left Kidney:  11.3 cm length.  Normal cortical thickness.  Upper normal cortical echogenicity.  No gross mass, hydronephrosis or shadowing calcification.  Bladder:  Well-distended.  No gross mass.  No definite ureteral jets were visualized during the study.  IMPRESSION: No definite renal sonographic abnormalities identified.  Original Report Authenticated By: Burnetta Sabin, [name]   Dg Chest Portable 1 View  01/12/2011  *RADIOLOGY REPORT*  Clinical Data: Shortness of breath, history lymphoma, diabetes  PORTABLE CHEST - 1 VIEW  Comparison: Portable exam 1520 hours compared to 07/04/2009  Findings: Prominent cardiac silhouette. Pulmonary vascular congestion. Bibasilar infiltrates. Upper lungs clear. No gross pleural effusion or pneumothorax. Bones unremarkable.  IMPRESSION: Bibasilar infiltrates.  Original Report Authenticated By: Burnetta Sabin, [name]    Medications: I have reviewed the patient's current medications.  Assessment/Plan:  1. Renal failure, unspecified:  Creatinine improved despite diuresis.  2-D echocardiogram without evidence of congestive heart failure. Likely lower extremity edema is related to some degree of acute on chronic renal failure probably secondary to diabetic nephropathy. Appreciate Dr. Florentina Addison assistance. He continues to have good diuresis on high-dose Lasix. 2. High gap acidosis: Secondary to renal failure. Has been started on sodium bicarbonate tablets. Bicarbonate today is 16. 3. Questionable pneumonia: Chest x-ray admission showed infiltrates and he was started on treatment for community-acquired pneumonia, however he does not have any clinical signs for pneumonia including fever or cough. However because of his leukocytosis I will elect to continue antibiotics, however I will switch him over to by mouth another to avoid excessive fluid intake appear 4.  DIABETES MELLITUS-TYPE II: Although his CBGs are improved they still remain above 280. Will increase his 7030 insulin to 78 units twice daily from 75 units twice daily. 5.  Hypokalemia: Secondary to diuresis. Replete orally. Check magnesium level. 6.  Anemia: Secondary to renal failure. Per nephrology. 7.  Shortness of breath dyspnea: Improved. 8.  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 On admission INR was supratherapeutic and was given a dose of vitamin K. I will repeat PT INR in the morning to guide further Coumadin dosing. 9.  Hypertension: Well controlled.    LOS: 2 days   HERNANDEZ ACOSTA,ESTELA 01/14/2011, 3:13 PM

## 2011-01-14 NOTE — Progress Notes (Signed)
Subjective: Interval History: Shortness of breath orthopnea or paroxysmal nocturnal dyspnea. Patient also denies any nausea or vomiting. Appetite is good overall he said he'[name] feeling better.  Objective: Vital signs in last 24 hours: Temp:  [97.3 F (36.3 C)-98.2 F (36.8 C)] 98.2 F (36.8 C) (08/15 0514) Pulse Rate:  [88-101] 99  (08/15 0847) Resp:  [20] 20  (08/15 0514) BP: (109-150)/(69-89) 146/85 mmHg (08/15 0514) SpO2:  [94 %-98 %] 96 % (08/15 0847) Weight change:   Intake/Output from previous day: 08/14 0701 - 08/15 0700 In: 1436 [P.O.:1080; I.V.:6; IV Piggyback:350] Out: 2175 [Urine:2175] Intake/Output this shift: I/O this shift: In: 240 [P.O.:240] Out: -   General appearance: alert Resp: clear to auscultation bilaterally Cardio: regular rate and rhythm, S1, S2 normal, no murmur, click, rub or gallop GI: soft, non-tender; bowel sounds normal; no masses,  no organomegaly Extremities: edema Patient with However the edema seems to be improving. 2-3+ edema bilaterally.  Lab Results:  [facility] 01/14/11 0436 01/13/11 0820  WBC 23.0* 18.9*  HGB 8.4* 8.7*  HCT 24.3* 26.4*  PLT 527* 495*   BMET:  Basename 01/14/11 0436 01/13/11 0820  NA 133* 129*  K 3.3* 4.1  CL 98 95*  CO2 16* 15*  GLUCOSE 371* 687*  BUN 79* 64*  CREATININE 4.44* 5.06*  CALCIUM 9.2 9.0   No results found for this basename: PTH:2 in the last 72 hours Iron Studies:  Basename 01/13/11 0808  IRON 20*  TIBC 155*  TRANSFERRIN --  FERRITIN 817*    Studies/Results: Ct Chest Wo Contrast  01/13/2011  *RADIOLOGY REPORT*  Clinical Data: Pulmonary infiltrates, history lymphoma  CT CHEST WITHOUT CONTRAST  Technique:  Multidetector CT imaging of the chest was performed following the standard protocol without IV contrast.  Comparison: 12/09/2010 Correlation:  Chest radiograph 01/12/2011  Findings: Mild atherosclerotic calcification aorta. Scattered normal-sized mediastinal lymph nodes. No definite thoracic  adenopathy. Respiratory motion artifacts notably at lung bases and upper abdomen. Visualized portion of upper abdomen grossly unremarkable. Mild bilateral gynecomastia. Airspace consolidation in bilateral lower lobes question pneumonia versus aspiration. Minimal infiltrate identified at right middle lobe and in lingula image 32. Remaining upper lobes lobes clear bilaterally. No gross pulmonary mass or pleural effusion. Probable bone island within a thoracic vertebra unchanged since prior CT.  IMPRESSION: Bilateral lower lobe pulmonary infiltrates most consistent with pneumonia, though aspiration could cause a similar appearance. Mild bilateral gynecomastia, nonspecific, can be seen from multiple etiologies including medications. Remainder of exam unremarkable.  Original Report Authenticated By: Burnetta Sabin, [name]   US Renal  01/13/2011  *RADIOLOGY REPORT*  Clinical Data: Renal failure, diabetes, hypertension  RENAL/URINARY TRACT ULTRASOUND COMPLETE  Comparison:  Abdomen ultrasound 10/26/2008  Findings:  Right Kidney:  12.5 cm length.  Grossly normal cortical thickness and echogenicity.  Degradation of image quality secondary to body habitus.  No gross mass, hydronephrosis or shadowing calcification.  Left Kidney:  11.3 cm length.  Normal cortical thickness.  Upper normal cortical echogenicity.  No gross mass, hydronephrosis or shadowing calcification.  Bladder:  Well-distended.  No gross mass.  No definite ureteral jets were visualized during the study.  IMPRESSION: No definite renal sonographic abnormalities identified.  Original Report Authenticated By: Burnetta Sabin, [name]   Dg Chest Portable 1 View  01/12/2011  *RADIOLOGY REPORT*  Clinical Data: Shortness of breath, history lymphoma, diabetes  PORTABLE CHEST - 1 VIEW  Comparison: Portable exam 1520 hours compared to 07/04/2009  Findings: Prominent cardiac silhouette. Pulmonary vascular congestion. Bibasilar  infiltrates. Upper lungs clear. No gross pleural  effusion or pneumothorax. Bones unremarkable.  IMPRESSION: Bibasilar infiltrates.  Original Report Authenticated By: Burnetta Sabin, [name]    I have reviewed the patient'[name] current medications.  Assessment/Plan: Problem #1 CHF patient on diuretics his urine output has seems to be improving he is about 800 cc negative fluid balance. Patient has this moment a symptomatic however still he seems to have significant edema of his legs. Problem #2 history of renal failure at this moment the nausea whether this acute on chronic habit and creatinine however seems to be improving. Since the patient has some mild proteinuria possibly we may be dealing with underlying her chronic diabetic nephropathy was superimposed and he can have a prerenal syndrome. Problem #3 history of hypertension blood pressure seems to be controlled very well Problem #4 history of diabetes on insulin her sugar seems to be better Problem #5 history of hyperkalemia potassium decreased to 3.7 Problem #6 history of hyponatremia possibly secondary to hypovolemic hyponatremia sodium is 133 such history improving. Problem #7 anemia etiology not clear possibly iron deficiency anemia versus anemia of chronic disease. Ferritin is high however iron saturation was not able to be calculated as his TIBC and a seems to be also low. Problem #8 history of lymphoma status post chemotherapy Problem #9 history of DVT Problem #10 history of obesity. Problem #11 history of her leukocytosis presently is on antibiotics his white blood cell count is still high. Problem #12 history of proteinuria seems to be non-nephrotic possibly secondary to diabetes as patient has history of diabetic retinopathy status post laser treatment and long-standing history of diabetes. Recommendation we'll continue his present management We'll start him on Nu-Iron 150 mg once a day We'll check her history intact PTH and also 25 vitamin D and the possible we'll consider starting on  Epogen as outpatient. We'll follow his present metabolic panel and CBC. We'll check ANA hepatitis B surface antigen, hepatitis C antibody, compliment.  We'll continue to follow patient with you thank you   LOS: 2 days   [name] 01/14/2011,9:29 AM

## 2011-01-14 NOTE — Progress Notes (Signed)
ANTICOAGULATION CONSULT NOTE - Initial Consult  Pharmacy Consult for Coumadin Indication: h/o multiple DVT  Patient Measurements: Height: 6' 3.5" (191.8 cm) Weight: 313 lb 7.9 oz (142.2 kg) IBW/kg (Calculated) : 85.65   Vital Signs: Temp: 98.7 F (37.1 C) (08/15 1400) Temp src: Oral (08/15 1400) BP: 130/74 mmHg (08/15 1400) Pulse Rate: 100  (08/15 1400)  Labs:  Basename 01/14/11 1428 01/14/11 0436 01/13/11 0820 01/13/11 0808 01/13/11 01/12/11 1459  HGB -- 8.4* 8.7* -- -- --  HCT -- 24.3* 26.4* -- -- 23.4*  PLT -- 527* 495* -- -- 421*  APTT -- -- -- -- -- --  LABPROT -- -- 23.1* -- -- 80.3*  INR -- -- 2.01* -- -- 9.89*  HEPARINUNFRC -- -- -- -- -- --  CREATININE 4.44* 4.44* 5.06* -- -- --  CRCLEARANCE 40* -- -- -- -- --  CKTOTAL -- -- -- 414* 432* 447*  CKMB -- -- -- 9.0* 6.2* 4.5*  TROPONINI -- -- -- <0.30 <0.30 <0.30    Medical History: Past Medical History  Diagnosis Date  . Cancer   . Diabetes mellitus   . DVT (deep venous thrombosis)   . Diabetic nephropathy   . Diabetic retinopathy   . Hypertension   . Hyperlipidemia   . Non Hodgkin's lymphoma     Tx 2009   Medications:  Scheduled:    . docusate sodium  100 mg Oral BID  . furosemide  100 mg Intravenous Q12H  . insulin aspart  0-15 Units Subcutaneous TID WC  . insulin aspart  0-5 Units Subcutaneous QHS  . insulin aspart  20 Units Subcutaneous Once  . insulin aspart protamine-insulin aspart  78 Units Subcutaneous BID WC  . moxifloxacin  400 mg Intravenous Q24H  . pantoprazole  40 mg Oral QAC breakfast  . polyethylene glycol  17 g Oral Daily  . potassium chloride  40 mEq Oral Q4H  . sodium bicarbonate  650 mg Oral BID  . sodium chloride      . sodium chloride      . warfarin  5 mg Oral ONCE-1800  . DISCONTD: azithromycin  500 mg Intravenous Q24H  . DISCONTD: cefTRIAXone (ROCEPHIN) IV  1 g Intravenous Q24H  . DISCONTD: insulin aspart protamine-insulin aspart  40 Units Subcutaneous BID WC  .  DISCONTD: insulin aspart protamine-insulin aspart  75 Units Subcutaneous BID WC  . DISCONTD: pantoprazole  40 mg Oral QAC breakfast  . DISCONTD: pantoprazole  40 mg Oral QAC breakfast   Assessment: INR therapeutic now after Vitamin K admin Was supra therapeutic upon admission Will likely blunt coumadin response Goal of Therapy:  INR 2-3   Plan:  coumadin 5mg  today INR daily until stable Adjust Coumadin dosing daily based on INR  [place] Crane, Maxime Beckner A 01/14/2011,3:52 PM

## 2011-01-15 DIAGNOSIS — J189 Pneumonia, unspecified organism: Secondary | ICD-10-CM

## 2011-01-15 DIAGNOSIS — E872 Acidosis, unspecified: Secondary | ICD-10-CM

## 2011-01-15 LAB — GLUCOSE, CAPILLARY
Glucose-Capillary: 94 mg/dL (ref 70–99)
Glucose-Capillary: 76 mg/dL (ref 70–99)
Glucose-Capillary: 72 mg/dL (ref 70–99)
Glucose-Capillary: 131 mg/dL — ABNORMAL HIGH (ref 70–99)
Glucose-Capillary: 144 mg/dL — ABNORMAL HIGH (ref 70–99)
Glucose-Capillary: 105 mg/dL — ABNORMAL HIGH (ref 70–99)

## 2011-01-15 LAB — BASIC METABOLIC PANEL
BUN: 82 mg/dL — ABNORMAL HIGH (ref 6–23)
CO2: 19 mEq/L (ref 19–32)
Calcium: 9.1 mg/dL (ref 8.4–10.5)
Chloride: 102 mEq/L (ref 96–112)
Creatinine, Ser: 4.31 mg/dL — ABNORMAL HIGH (ref 0.50–1.35)
GFR calc Af Amer: 17 mL/min — ABNORMAL LOW (ref >60)
GFR calc non Af Amer: 14 mL/min — ABNORMAL LOW (ref >60)
Glucose, Bld: 90 mg/dL (ref 70–99)
Potassium: 3.3 mEq/L — ABNORMAL LOW (ref 3.5–5.1)
Sodium: 139 mEq/L (ref 135–145)

## 2011-01-15 LAB — CBC
HCT: 24.5 % — ABNORMAL LOW (ref 39.0–52.0)
Hemoglobin: 8.5 g/dL — ABNORMAL LOW (ref 13.0–17.0)
MCH: 25.8 pg — ABNORMAL LOW (ref 26.0–34.0)
MCHC: 34.7 g/dL (ref 30.0–36.0)
MCV: 74.2 fL — ABNORMAL LOW (ref 78.0–100.0)
Platelets: 551 10*3/uL — ABNORMAL HIGH (ref 150–400)
RBC: 3.3 MIL/uL — ABNORMAL LOW (ref 4.22–5.81)
RDW: 17.1 % — ABNORMAL HIGH (ref 11.5–15.5)
WBC: 16.3 10*3/uL — ABNORMAL HIGH (ref 4.0–10.5)

## 2011-01-15 LAB — ANTI-NUCLEAR AB-TITER (ANA TITER): ANA Titer 1: 1:320 {titer} — ABNORMAL HIGH

## 2011-01-15 LAB — C3 COMPLEMENT: C3 Complement: 165 mg/dL (ref 90–180)

## 2011-01-15 LAB — PTH, INTACT AND CALCIUM
Calcium, Total (PTH): 7.8 mg/dL — ABNORMAL LOW (ref 8.4–10.5)
PTH: 129.5 pg/mL — ABNORMAL HIGH (ref 14.0–72.0)

## 2011-01-15 LAB — PROTIME-INR
INR: 2.12 — ABNORMAL HIGH (ref 0.00–1.49)
Prothrombin Time: 24.1 seconds — ABNORMAL HIGH (ref 11.6–15.2)

## 2011-01-15 LAB — C4 COMPLEMENT: Complement C4, Body Fluid: 57 mg/dL — ABNORMAL HIGH (ref 10–40)

## 2011-01-15 LAB — ANA: Anti Nuclear Antibody(ANA): POSITIVE — AB

## 2011-01-15 LAB — ANTISTREPTOLYSIN O TITER: ASO: <25 IU/mL (ref 0–408)

## 2011-01-15 MED ORDER — TORSEMIDE 20 MG PO TABS
50.0000 mg | ORAL_TABLET | Freq: Every day | ORAL | Status: DC
  Administered 2011-01-15 – 2011-01-16 (×2): 50 mg via ORAL
  Filled 2011-01-15 (×2): qty 3

## 2011-01-15 MED ORDER — POTASSIUM CHLORIDE CRYS ER 20 MEQ PO TBCR
40.0000 meq | EXTENDED_RELEASE_TABLET | Freq: Two times a day (BID) | ORAL | Status: DC
  Administered 2011-01-15 – 2011-01-16 (×3): 40 meq via ORAL
  Filled 2011-01-15 (×3): qty 2

## 2011-01-15 MED ORDER — WARFARIN SODIUM 2 MG PO TABS
4.0000 mg | ORAL_TABLET | Freq: Once | ORAL | Status: AC
  Administered 2011-01-15: 4 mg via ORAL
  Filled 2011-01-15: qty 2

## 2011-01-15 MED ORDER — POLYSACCHARIDE IRON 150 MG PO CAPS
150.0000 mg | ORAL_CAPSULE | Freq: Every day | ORAL | Status: DC
  Administered 2011-01-15 – 2011-01-16 (×2): 150 mg via ORAL
  Filled 2011-01-15 (×2): qty 1

## 2011-01-15 MED ORDER — GUAIFENESIN ER 600 MG PO TB12
600.0000 mg | ORAL_TABLET | Freq: Two times a day (BID) | ORAL | Status: DC
  Administered 2011-01-15 – 2011-01-16 (×3): 600 mg via ORAL
  Filled 2011-01-15 (×3): qty 1

## 2011-01-15 MED ORDER — OXYCODONE HCL 5 MG PO TABS
5.0000 mg | ORAL_TABLET | Freq: Four times a day (QID) | ORAL | Status: DC | PRN
  Administered 2011-01-15 – 2011-01-16 (×3): 5 mg via ORAL
  Filled 2011-01-15 (×3): qty 1

## 2011-01-15 NOTE — Progress Notes (Signed)
Subjective: Interval History: has complaints headache and also dry cough. Patient denies any shortness of breath orthopnea or paroxysmal nocturnal dyspnea. He denies also any nausea or vomiting appetite is good..  Objective: Vital signs in last 24 hours: Temp:  [97.4 F (36.3 C)-98.7 F (37.1 C)] 97.5 F (36.4 C) (08/16 0400) Pulse Rate:  [92-100] 94  (08/16 0400) Resp:  [18-20] 19  (08/16 0400) BP: (116-138)/(70-77) 120/70 mmHg (08/16 0400) SpO2:  [93 %-97 %] 94 % (08/16 0400) Weight:  [139.345 kg (307 lb 3.2 oz)] 307 lb 3.2 oz (139.345 kg) (08/16 0617) Weight change:   Intake/Output from previous day: 08/15 0701 - 08/16 0700 In: 360 [P.O.:360] Out: 2550 [Urine:2550] Intake/Output this shift:    General appearance: alert Resp: clear to auscultation bilaterally Cardio: regular rate and rhythm, S1, S2 normal, no murmur, click, rub or gallop GI: soft, non-tender; bowel sounds normal; no masses,  no organomegaly Extremities: edema He has trace edema  Lab Results:  [facility] 01/15/11 0458 01/14/11 0436  WBC 16.3* 23.0*  HGB 8.5* 8.4*  HCT 24.5* 24.3*  PLT 551* 527*   BMET:  Basename 01/15/11 0458 01/14/11 1428 01/14/11 0436  NA 139 -- 133*  K 3.3* -- 3.3*  CL 102 -- 98  CO2 19 -- 16*  GLUCOSE 90 -- 371*  BUN 82* -- 79*  CREATININE 4.31* 4.44* --  CALCIUM 9.1 -- 9.2   No results found for this basename: PTH:2 in the last 72 hours Iron Studies:  Basename 01/13/11 0808  IRON 20*  TIBC 155*  TRANSFERRIN --  FERRITIN 817*    Studies/Results: Ct Chest Wo Contrast  01/13/2011  *RADIOLOGY REPORT*  Clinical Data: Pulmonary infiltrates, history lymphoma  CT CHEST WITHOUT CONTRAST  Technique:  Multidetector CT imaging of the chest was performed following the standard protocol without IV contrast.  Comparison: 12/09/2010 Correlation:  Chest radiograph 01/12/2011  Findings: Mild atherosclerotic calcification aorta. Scattered normal-sized mediastinal lymph nodes. No definite  thoracic adenopathy. Respiratory motion artifacts notably at lung bases and upper abdomen. Visualized portion of upper abdomen grossly unremarkable. Mild bilateral gynecomastia. Airspace consolidation in bilateral lower lobes question pneumonia versus aspiration. Minimal infiltrate identified at right middle lobe and in lingula image 32. Remaining upper lobes lobes clear bilaterally. No gross pulmonary mass or pleural effusion. Probable bone island within a thoracic vertebra unchanged since prior CT.  IMPRESSION: Bilateral lower lobe pulmonary infiltrates most consistent with pneumonia, though aspiration could cause a similar appearance. Mild bilateral gynecomastia, nonspecific, can be seen from multiple etiologies including medications. Remainder of exam unremarkable.  Original Report Authenticated By: Burnetta Sabin, [name]   US Renal  01/13/2011  *RADIOLOGY REPORT*  Clinical Data: Renal failure, diabetes, hypertension  RENAL/URINARY TRACT ULTRASOUND COMPLETE  Comparison:  Abdomen ultrasound 10/26/2008  Findings:  Right Kidney:  12.5 cm length.  Grossly normal cortical thickness and echogenicity.  Degradation of image quality secondary to body habitus.  No gross mass, hydronephrosis or shadowing calcification.  Left Kidney:  11.3 cm length.  Normal cortical thickness.  Upper normal cortical echogenicity.  No gross mass, hydronephrosis or shadowing calcification.  Bladder:  Well-distended.  No gross mass.  No definite ureteral jets were visualized during the study.  IMPRESSION: No definite renal sonographic abnormalities identified.  Original Report Authenticated By: Burnetta Sabin, [name]    I have reviewed the patient's current medications.  Assessment/Plan: Problem #1 renal failure at this moment the sensory acute on chronic pending creatinine improving. The etiology is this moment seems  to be secondary to diabetic nephropathy  Patient does not have any uremic sinus symptoms. Problem #2 proteinuria at this  moment seems to be nephrotic syndrome as patient has low albumin edema and the dorsal 7 g of protein in the urine. HistoryA NA is negative, complement is normal, Hepatitis B surface antigen is negative, hepatitis C antibodies also negative ASO titer is normal. Hence was long-standing history of her diabetes diabetic retinopathy at this moment the proteinuria most likely seems to be secondary to diabetic nephropathy. Problem #3 history of CHF patient on IV Lasix history output seems to be good and his CHF is improving. Problem #4 history of pneumonia he is on antibiotics and a febrile his white blood cell count is improving Problem #5 history of lymphoma Problem #6 history of hypokalemia potassium is 3.3 still seems to be low. Problem #7 history of her anemia most likely anemia of chronic disease and I name of her iron deficiency Problem #8 history of DVT Problem #9 history of a mild hyperphosphatemia this is related to his chronic kidney disease. Recommendation I will continue to replace his potassium with 40 mEq by mouth once a day We'll DC IV Lasix We'll start him on Demadex 60 mg by mouth once a day We'll continue with the sodium bicarbonate We'll start him on Nu-Iron 150 mg by mouth daily Once his renal function stabilizes we'll consider starting him on ACE inhibitor for his nephrotic syndrome. Would also consider starting him on Epogen as an outpatient If the patient is going to be discharged her make an appointment to be followed as an outpatient. This is end of her progress note thank you   LOS: 3 days   Paolo Okane S 01/15/2011,7:21 AM

## 2011-01-15 NOTE — Progress Notes (Signed)
Subjective: Is complaining of a headache today. States breathing and swelling are much improved.  Objective: Vital signs in last 24 hours: Temp:  [97.4 F (36.3 C)-98.7 F (37.1 C)] 97.9 F (36.6 C) (08/16 1019) Pulse Rate:  [92-100] 94  (08/16 1019) Resp:  [16-20] 16  (08/16 1019) BP: (116-130)/(70-74) 128/72 mmHg (08/16 1019) SpO2:  [93 %-97 %] 94 % (08/16 1019) Weight:  [139.345 kg (307 lb 3.2 oz)] 307 lb 3.2 oz (139.345 kg) (08/16 0617) Weight change:  Last BM Date: 01/14/11  Intake/Output from previous day: 08/15 0701 - 08/16 0700 In: 360 [P.O.:360] Out: 2550 [Urine:2550]   Physical Exam: General: Alert, awake, oriented x3, in no acute distress. HEENT: No bruits, no goiter. Heart: Regular rate and rhythm, without murmurs, rubs, gallops. Lungs: Bibasilar crackles Abdomen: Soft, nontender, nondistended, positive bowel sounds. Extremities:2+ edema to knees bilaterally. Neuro: Grossly intact, nonfocal.    Lab Results:  Basename 01/15/11 0458 01/14/11 0436  WBC 16.3* 23.0*  HGB 8.5* 8.4*  HCT 24.5* 24.3*  PLT 551* 527*    Basename 01/15/11 0458 01/14/11 1428 01/14/11 1001  NA 139 -- --  K 3.3* -- --  CL 102 -- --  CO2 19 -- --  GLUCOSE 90 -- --  BUN 82* -- --  CREATININE 4.31* 4.44* --  CALCIUM 9.1 -- 7.8*    Studies/Results: Ct Chest Wo Contrast  01/13/2011  *RADIOLOGY REPORT*  Clinical Data: Pulmonary infiltrates, history lymphoma  CT CHEST WITHOUT CONTRAST  Technique:  Multidetector CT imaging of the chest was performed following the standard protocol without IV contrast.  Comparison: 12/09/2010 Correlation:  Chest radiograph 01/12/2011  Findings: Mild atherosclerotic calcification aorta. Scattered normal-sized mediastinal lymph nodes. No definite thoracic adenopathy. Respiratory motion artifacts notably at lung bases and upper abdomen. Visualized portion of upper abdomen grossly unremarkable. Mild bilateral gynecomastia. Airspace consolidation in bilateral  lower lobes question pneumonia versus aspiration. Minimal infiltrate identified at right middle lobe and in lingula image 32. Remaining upper lobes lobes clear bilaterally. No gross pulmonary mass or pleural effusion. Probable bone island within a thoracic vertebra unchanged since prior CT.  IMPRESSION: Bilateral lower lobe pulmonary infiltrates most consistent with pneumonia, though aspiration could cause a similar appearance. Mild bilateral gynecomastia, nonspecific, can be seen from multiple etiologies including medications. Remainder of exam unremarkable.  Original Report Authenticated By: Burnetta Sabin, [name]   US Renal  01/13/2011  *RADIOLOGY REPORT*  Clinical Data: Renal failure, diabetes, hypertension  RENAL/URINARY TRACT ULTRASOUND COMPLETE  Comparison:  Abdomen ultrasound 10/26/2008  Findings:  Right Kidney:  12.5 cm length.  Grossly normal cortical thickness and echogenicity.  Degradation of image quality secondary to body habitus.  No gross mass, hydronephrosis or shadowing calcification.  Left Kidney:  11.3 cm length.  Normal cortical thickness.  Upper normal cortical echogenicity.  No gross mass, hydronephrosis or shadowing calcification.  Bladder:  Well-distended.  No gross mass.  No definite ureteral jets were visualized during the study.  IMPRESSION: No definite renal sonographic abnormalities identified.  Original Report Authenticated By: Burnetta Sabin, [name]    Medications: Scheduled Meds:   . docusate sodium  100 mg Oral BID  . insulin aspart  0-15 Units Subcutaneous TID WC  . insulin aspart  0-5 Units Subcutaneous QHS  . insulin aspart protamine-insulin aspart  78 Units Subcutaneous BID WC  . moxifloxacin  400 mg Intravenous Q24H  . pantoprazole  40 mg Oral QAC breakfast  . polyethylene glycol  17 g Oral Daily  . polysaccharide  iron  150 mg Oral Daily  . potassium chloride  40 mEq Oral Q4H  . potassium chloride  40 mEq Oral BID  . sodium bicarbonate  650 mg Oral BID  . sodium  chloride      . torsemide  50 mg Oral Daily  . warfarin  4 mg Oral ONCE-1800  . warfarin  5 mg Oral ONCE-1800  . DISCONTD: azithromycin  500 mg Intravenous Q24H  . DISCONTD: cefTRIAXone (ROCEPHIN) IV  1 g Intravenous Q24H  . DISCONTD: furosemide  100 mg Intravenous Q12H  . DISCONTD: insulin aspart protamine-insulin aspart  75 Units Subcutaneous BID WC  . DISCONTD: pantoprazole  40 mg Oral QAC breakfast  . DISCONTD: pantoprazole  40 mg Oral QAC breakfast   Continuous Infusions:  PRN Meds:.acetaminophen, acetaminophen, bisacodyl, morphine, ondansetron (ZOFRAN) IV, ondansetron, oxyCODONE, senna-docusate, sodium chloride, traZODone  Assessment/Plan:  1.  Renal failure, unspecified: Creatinine improved today AP. Likely etiology is acute on chronic kidney disease secondary to diabetic nephropathy. Baseline creatinine is still unknown at this time. Appreciate nephrology's assistance. 2.  DIABETES MELLITUS-TYPE II: CBGs are better controlled on increased dose of Lantus. Continue to follow. 3.  Hypokalemia: Continue to replete. Magnesium level is normal at 2.4 4.  Anemia 5.  Shortness of breath dyspnea: Improved. Is requesting Mucinex for cough. 6.  Warfarin-induced coagulopathy: INR is therapeutic. Pharmacy to continue to dose Coumadin. 7.  Hypertension: Blood pressure is well-controlled. Continue current medications. 8. Edema: 2-D echocardiogram without mention of diastolic or systolic congestive heart failure. Most likely lower extremity edema is related to renal failure. 9. Metabolic acidosis: This is secondary to his renal failure. He is on sodium bicarbonate tabs. His bicarbonate has increased to 19 today. 10. Pneumonia: Is on Avelox. WBCs have decreased to 16.3 from 23. We'll continue Avelox for 4 more days as scheduled.   LOS: 3 days   [name],[name] 01/15/2011, 12:44 PM

## 2011-01-15 NOTE — Plan of Care (Signed)
Problem: Phase II Progression Outcomes Goal: Discharge plan established Outcome: Completed/Met Date Met:  01/15/11 Discharge home when stable

## 2011-01-15 NOTE — Progress Notes (Signed)
ANTICOAGULATION CONSULT NOTE - Initial Consult  Pharmacy Consult for Coumadin Indication: h/o multiple DVT  Patient Measurements: Height: 6' 3.5" (191.8 cm) Weight: 307 lb 3.2 oz (139.345 kg) IBW/kg (Calculated) : 85.65   Vital Signs: Temp: 97.5 F (36.4 C) (08/16 0400) Temp src: Oral (08/16 0400) BP: 120/70 mmHg (08/16 0400) Pulse Rate: 94  (08/16 0400)  Labs:  Basename 01/15/11 0458 01/14/11 1602 01/14/11 1428 01/14/11 0436 01/13/11 0808 01/13/11 01/12/11 1459  HGB 8.5* -- -- 8.4* -- -- --  HCT 24.5* -- -- 24.3* -- -- --  PLT 551* -- -- 527* -- -- --  APTT -- -- -- -- -- -- --  LABPROT 24.1* 20.6* -- -- -- -- --  INR 2.12* 1.73* -- -- -- -- --  HEPARINUNFRC -- -- -- -- -- -- --  CREATININE 4.31* -- 4.44* 4.44* -- -- --  CRCLEARANCE -- -- 40* -- -- -- --  CKTOTAL -- -- -- -- 414* 432* 447*  CKMB -- -- -- -- 9.0* 6.2* 4.5*  TROPONINI -- -- -- -- <0.30 <0.30 <0.30    Medical History: Past Medical History  Diagnosis Date  . Cancer   . Diabetes mellitus   . DVT (deep venous thrombosis)   . Diabetic nephropathy   . Diabetic retinopathy   . Hypertension   . Hyperlipidemia   . Non Hodgkin's lymphoma     Tx 2009   Medications:  Home dose of Coumadin: Sig: Take 5 mg by mouth daily. Take one and one-half (7.5mg ) tablet by mouth on Sundays, Tuesdays, Thursdays, and Saturdays, then take two tablets (10mg ) on Mondays, Wednesdays, and Fridays or take as directed by coumadin clinic  Scheduled:     . docusate sodium  100 mg Oral BID  . insulin aspart  0-15 Units Subcutaneous TID WC  . insulin aspart  0-5 Units Subcutaneous QHS  . insulin aspart protamine-insulin aspart  78 Units Subcutaneous BID WC  . moxifloxacin  400 mg Intravenous Q24H  . pantoprazole  40 mg Oral QAC breakfast  . polyethylene glycol  17 g Oral Daily  . polysaccharide iron  150 mg Oral Daily  . potassium chloride  40 mEq Oral Q4H  . potassium chloride  40 mEq Oral BID  . sodium bicarbonate  650 mg  Oral BID  . sodium chloride      . torsemide  50 mg Oral Daily  . warfarin  4 mg Oral ONCE-1800  . warfarin  5 mg Oral ONCE-1800  . DISCONTD: azithromycin  500 mg Intravenous Q24H  . DISCONTD: cefTRIAXone (ROCEPHIN) IV  1 g Intravenous Q24H  . DISCONTD: furosemide  100 mg Intravenous Q12H  . DISCONTD: insulin aspart protamine-insulin aspart  75 Units Subcutaneous BID WC  . DISCONTD: pantoprazole  40 mg Oral QAC breakfast  . DISCONTD: pantoprazole  40 mg Oral QAC breakfast   Assessment: INR therapeutic now Vitamin K was given upon admission due to INR supra therapeutic at that time  Goal of Therapy:  INR 2-3   Plan:  coumadin 4mg  today INR daily until stable Adjust Coumadin dosing daily based on INR  [place] Crane, Kazaria Gaertner A 01/15/2011,8:26 AM

## 2011-01-16 LAB — PROTIME-INR
INR: 2.2 — ABNORMAL HIGH (ref 0.00–1.49)
Prothrombin Time: 24.8 seconds — ABNORMAL HIGH (ref 11.6–15.2)

## 2011-01-16 LAB — BASIC METABOLIC PANEL
BUN: 81 mg/dL — ABNORMAL HIGH (ref 6–23)
CO2: 20 mEq/L (ref 19–32)
Calcium: 8.8 mg/dL (ref 8.4–10.5)
Chloride: 104 mEq/L (ref 96–112)
Creatinine, Ser: 4.61 mg/dL — ABNORMAL HIGH (ref 0.50–1.35)
GFR calc Af Amer: 16 mL/min — ABNORMAL LOW (ref >60)
GFR calc non Af Amer: 13 mL/min — ABNORMAL LOW (ref >60)
Glucose, Bld: 74 mg/dL (ref 70–99)
Potassium: 3.2 mEq/L — ABNORMAL LOW (ref 3.5–5.1)
Sodium: 140 mEq/L (ref 135–145)

## 2011-01-16 LAB — CBC
HCT: 24.9 % — ABNORMAL LOW (ref 39.0–52.0)
Hemoglobin: 8.4 g/dL — ABNORMAL LOW (ref 13.0–17.0)
MCH: 25.5 pg — ABNORMAL LOW (ref 26.0–34.0)
MCHC: 33.7 g/dL (ref 30.0–36.0)
MCV: 75.7 fL — ABNORMAL LOW (ref 78.0–100.0)
Platelets: 576 10*3/uL — ABNORMAL HIGH (ref 150–400)
RBC: 3.29 MIL/uL — ABNORMAL LOW (ref 4.22–5.81)
RDW: 17.5 % — ABNORMAL HIGH (ref 11.5–15.5)
WBC: 9.9 10*3/uL (ref 4.0–10.5)

## 2011-01-16 LAB — GLUCOSE, CAPILLARY
Glucose-Capillary: 73 mg/dL (ref 70–99)
Glucose-Capillary: 151 mg/dL — ABNORMAL HIGH (ref 70–99)

## 2011-01-16 LAB — COMPLEMENT, TOTAL: Compl, Total (CH50): >60 U/mL — ABNORMAL HIGH (ref 31–60)

## 2011-01-16 MED ORDER — TORSEMIDE 10 MG PO TABS: 50.0000 mg | ORAL_TABLET | Freq: Every day | ORAL | Status: DC

## 2011-01-16 MED ORDER — SODIUM BICARBONATE 650 MG PO TABS: 650.0000 mg | ORAL_TABLET | Freq: Two times a day (BID) | ORAL | Status: DC

## 2011-01-16 MED ORDER — POTASSIUM CHLORIDE CRYS ER 20 MEQ PO TBCR: 20.0000 meq | EXTENDED_RELEASE_TABLET | Freq: Every day | ORAL | Status: DC

## 2011-01-16 MED ORDER — DARBEPOETIN ALFA-POLYSORBATE 60 MCG/0.3ML IJ SOLN
60.0000 ug | Freq: Once | INTRAMUSCULAR | Status: DC
  Filled 2011-01-16: qty 0.3

## 2011-01-16 MED ORDER — POLYSACCHARIDE IRON 150 MG PO CAPS: 150.0000 mg | ORAL_CAPSULE | Freq: Every day | ORAL | Status: DC

## 2011-01-16 MED ORDER — WARFARIN SODIUM 2 MG PO TABS: 4.0000 mg | ORAL_TABLET | Freq: Once | ORAL | Status: DC

## 2011-01-16 NOTE — Progress Notes (Signed)
Pt discharged home today with home health per Dr. Jerilee Hoh. Pt's IV site D/C'd and WNL. Pt's VS stable at this time. Pt provided with home medication list and discharge instructions. Pt verbalized understanding. Pt made aware of need for F/U appointment. Verbalized understanding. Pt left floor via WC in stable condition accompanied by NT. Sharyn Blitz, RN

## 2011-01-16 NOTE — Discharge Summary (Signed)
Physician Discharge Summary  Patient ID: [name] MRN: [id number] DOB/AGE: 58/25/1952 58 y.o.  Admit date: 01/12/2011 Discharge date: 01/16/2011  Primary Care Physician:  Geoffery Lyons, MD   Discharge Diagnoses:    Patient Active Problem List  Diagnoses  . LYMPHOMA  . DIABETES MELLITUS-TYPE II  . ABDOMINAL PAIN -GENERALIZED  . PERSONAL HX COLONIC POLYPS  . Renal failure, unspecified  . Hypokalemia  . Anemia  . Shortness of breath dyspnea  . Warfarin-induced coagulopathy  . History of DVT of lower extremity  . Hypertension  . Acidosis, metabolic  . PNA (pneumonia)    Current Discharge Medication List    START taking these medications   Details  polysaccharide iron (NIFEREX) 150 MG CAPS capsule Take 1 capsule (150 mg total) by mouth daily. Qty: 30 each, Refills: 2    potassium chloride SA (K-DUR,KLOR-CON) 20 MEQ tablet Take 1 tablet (20 mEq total) by mouth daily. Qty: 30 tablet, Refills: 1    sodium bicarbonate 650 MG tablet Take 1 tablet (650 mg total) by mouth 2 (two) times daily. Qty: 60 tablet, Refills: 1    torsemide (DEMADEX) 10 MG tablet Take 5 tablets (50 mg total) by mouth daily. Qty: 150 tablet, Refills: 1      CONTINUE these medications which have NOT CHANGED   Details  amLODipine-benazepril (LOTREL) 5-20 MG per capsule Take 1 capsule by mouth daily.      insulin lispro (HUMALOG) 100 UNIT/ML injection Inject 10-75 Units into the skin 3 (three) times daily before meals. Inject 75 units every day in the morning and at lunch and inject 10 units at bedtime     warfarin (COUMADIN) 5 MG tablet Take 5 mg by mouth daily. Take one and one-half (7.5mg ) tablet by mouth on Sundays, Tuesdays, Thursdays, and Saturdays, then take two tablets (10mg ) on Mondays, Wednesdays, and Fridays or take as directed by coumadin clinic.         Disposition and Follow-up: Patient will need to followup with Dr. Lowanda Foster in one week for followup on his renal function and  decision on timing of dialysis the  Consults:  nephrology Dr. Lowanda Foster   Significant Diagnostic Studies:  Dg Chest Portable 1 View  01/12/2011  *RADIOLOGY REPORT*  Clinical Data: Shortness of breath, history lymphoma, diabetes  PORTABLE CHEST - 1 VIEW  Comparison: Portable exam 1520 hours compared to 07/04/2009  Findings: Prominent cardiac silhouette. Pulmonary vascular congestion. Bibasilar infiltrates. Upper lungs clear. No gross pleural effusion or pneumothorax. Bones unremarkable.  IMPRESSION: Bibasilar infiltrates.  Original Report Authenticated By: Burnetta Sabin, [name]      Hospital Course:   1.  Renal failure, unspecified: Most likely related to diabetic nephropathy. His creatinine has remained in the 4.3-5 range. He has not exhibited any signs or symptoms to indicate acute need for dialysis. Dr. Lowanda Foster and I have decided today to discharge him home and to followup with him in one week for followup on renal function. 2.  Acidosis, metabolic: Secondary to renal failure. Has improved on sodium bicarbonate tablets. 3.  PNA (pneumonia): Has 2 more days of Avelox remaining to complete a seven-day course of antibiotics. 4.  DIABETES MELLITUS-TYPE II: Stable continue home medications 5.  Hypokalemia: On KCl supplementation. 6.  Anemia: Secondary to chronic kidney failure has been started on iron supplementation and has been given a dose of Epogen. 7.  Shortness of breath dyspnea: Resolved. Presumed secondary to pneumonia. 8.  Warfarin-induced coagulopathy: His INR was supratherapeutic on admission. This has  now corrected. Coumadin has been restarted. 9.  Hypertension: Well controlled throughout this hospitalization.    SignedLelon Frohlich 01/16/2011, 10:37 AM

## 2011-01-16 NOTE — Progress Notes (Signed)
Subjective: Patient offers no complaints he'll have any nausea no vomiting. Appetite is good. He denies any shortness of breath orthopnea I  Objective: Vital signs in last 24 hours: Temp:  [97.1 F (36.2 C)-98.6 F (37 C)] 98.6 F (37 C) (08/17 0557) Pulse Rate:  [90-102] 96  (08/17 0557) Resp:  [16-20] 19  (08/17 0557) BP: (122-131)/(72-84) 131/84 mmHg (08/17 0557) SpO2:  [94 %-98 %] 96 % (08/17 0557) Weight:  [139.889 kg (308 lb 6.4 oz)] 308 lb 6.4 oz (139.889 kg) (08/17 0612) Weight change: 0.544 kg (1 lb 3.2 oz)  Intake/Output from previous day: 08/16 0701 - 08/17 0700 In: 1080 [P.O.:1080] Out: -  Intake/Output this shift:    General appearance: alert Resp: clear to auscultation bilaterally Cardio: regular rate and rhythm, S1, S2 normal, no murmur, click, rub or gallop Extremities: extremities normal, atraumatic, no cyanosis or edema  Lab Results:  [facility] 01/16/11 0533 01/15/11 0458  WBC 9.9 16.3*  HGB 8.4* 8.5*  HCT 24.9* 24.5*  PLT 576* 551*   BMET:  Basename 01/16/11 0533 01/15/11 0458  NA 140 139  K 3.2* 3.3*  CL 104 102  CO2 20 19  GLUCOSE 74 90  BUN 81* 82*  CREATININE 4.61* 4.31*  CALCIUM 8.8 9.1    Basename 01/14/11 1001  PTH 129.5*   Iron Studies: No results found for this basename: IRON,TIBC,TRANSFERRIN,FERRITIN in the last 72 hours  Studies/Results: No results found.  I have reviewed the patient's current medications.  Assessment/Plan: Problem #1 renal failure at this moment seems to be possibly chronic stage IV. Patient doesn't have any signs and symptoms off uremia at. Is pending creatinine is slightly up today. Problem #2 history of hypokalemia as this is related to his Lasix . he is on potassium supplement potassium still remains low. Her Problem #3 her history of CHF and he is on none diuretics her history notable seems to be reasonably good he has lost some weight edema has improved. Problem #4 history of proteinuria in nephrotic  range possibly related to his diabetes is not on ACE inhibitor. Problem #5 history of diabetes is on hypoglycemic agent Problem #6 history of anemia this is thought to be secondary compression of iron deficiency and anemia of chronic disease he started her on the Nu-Iron. Her problem #7 a history of her lymphoma Her problem #8 history of her metabolic acidosis he is the one of sodium bicarbonate PCO2 has improved her to 20 her. Problem #9 history of obesity. Recommendation we'll continue with present management and the I will start patient on the Epogen and I will see her her patient in the office is going to be discharged and make a decision her for access placement. As this moment patient may not require dialysis however her because of this hygienic creatinine patient may need dialysis very soon. He was renal function stabilized and creatinine comes down probably will try to use some ACE inhibitor to see whether that will help him which is proteinuria. This is end of her followup thank you    LOS: 4 days   Hanin Decook S 01/16/2011,8:59 AM

## 2011-01-16 NOTE — Progress Notes (Signed)
ANTICOAGULATION CONSULT NOTE - Initial Consult  Pharmacy Consult for Coumadin Indication: h/o multiple DVT  Patient Measurements: Height: 6' 3.5" (191.8 cm) Weight: 308 lb 6.4 oz (139.889 kg) IBW/kg (Calculated) : 85.65   Vital Signs: Temp: 98.6 F (37 C) (08/17 0557) Temp src: Oral (08/17 0557) BP: 131/84 mmHg (08/17 0557) Pulse Rate: 96  (08/17 0557)  Labs:  Basename 01/16/11 0533 01/15/11 0458 01/14/11 1602 01/14/11 1428  HGB 8.4* 8.5* -- --  HCT 24.9* 24.5* -- --  PLT 576* 551* -- --  APTT -- -- -- --  LABPROT 24.8* 24.1* 20.6* --  INR 2.20* 2.12* 1.73* --  HEPARINUNFRC -- -- -- --  CREATININE 4.61* 4.31* -- 4.44*  CRCLEARANCE -- -- -- 40*  CKTOTAL -- -- -- --  CKMB -- -- -- --  TROPONINI -- -- -- --    Medical History: Past Medical History  Diagnosis Date  . Cancer   . Diabetes mellitus   . DVT (deep venous thrombosis)   . Diabetic nephropathy   . Diabetic retinopathy   . Hypertension   . Hyperlipidemia   . Non Hodgkin's lymphoma     Tx 2009   Medications:  Home dose of Coumadin: Sig: Take 5 mg by mouth daily. Take one and one-half (7.5mg ) tablet by mouth on Sundays, Tuesdays, Thursdays, and Saturdays, then take two tablets (10mg ) on Mondays, Wednesdays, and Fridays or take as directed by coumadin clinic  Scheduled:     . darbepoetin  60 mcg Subcutaneous Once  . docusate sodium  100 mg Oral BID  . guaiFENesin  600 mg Oral BID  . insulin aspart  0-15 Units Subcutaneous TID WC  . insulin aspart protamine-insulin aspart  78 Units Subcutaneous BID WC  . moxifloxacin  400 mg Intravenous Q24H  . pantoprazole  40 mg Oral QAC breakfast  . polyethylene glycol  17 g Oral Daily  . polysaccharide iron  150 mg Oral Daily  . potassium chloride  40 mEq Oral BID  . sodium bicarbonate  650 mg Oral BID  . torsemide  50 mg Oral Daily  . warfarin  4 mg Oral ONCE-1800  . DISCONTD: insulin aspart  0-5 Units Subcutaneous QHS   Assessment: INR therapeutic now  Goal  of Therapy:  INR 2-3   Plan:  Repeat Coumadin 4mg  today INR daily until stable Adjust Coumadin dosing daily based on INR  Pricilla Larsson 01/16/2011,9:33 AM

## 2011-01-19 LAB — GLUCOSE, CAPILLARY: Glucose-Capillary: 105 mg/dL — ABNORMAL HIGH (ref 70–99)

## 2011-02-25 LAB — CBC
HCT: 30.1 — ABNORMAL LOW
HCT: 31.4 — ABNORMAL LOW
Hemoglobin: 10.3 — ABNORMAL LOW
Hemoglobin: 9.9 — ABNORMAL LOW
MCHC: 32.9
MCHC: 32.9
MCV: 79
MCV: 79.5
Platelets: 310
Platelets: 336
RBC: 3.78 — ABNORMAL LOW
RBC: 3.97 — ABNORMAL LOW
RDW: 15.6 — ABNORMAL HIGH
RDW: 16.3 — ABNORMAL HIGH
WBC: 6.1
WBC: 6.5

## 2011-02-25 LAB — BASIC METABOLIC PANEL
BUN: 14
BUN: 39 — ABNORMAL HIGH
CO2: 25
CO2: 26
Calcium: 9.1
Calcium: 9.5
Chloride: 107
Chloride: 108
Creatinine, Ser: 1.44
Creatinine, Ser: 2.02 — ABNORMAL HIGH
GFR calc Af Amer: 42 — ABNORMAL LOW
GFR calc Af Amer: >60
GFR calc non Af Amer: 35 — ABNORMAL LOW
GFR calc non Af Amer: 51 — ABNORMAL LOW
Glucose, Bld: 103 — ABNORMAL HIGH
Glucose, Bld: 86
Potassium: 4.2
Potassium: 4.4
Sodium: 137
Sodium: 141

## 2011-02-26 LAB — CBC
HCT: 25.8 — ABNORMAL LOW
Hemoglobin: 8.4 — ABNORMAL LOW
MCHC: 32.4
MCV: 78.3
Platelets: 299
RBC: 3.29 — ABNORMAL LOW
RDW: 16.2 — ABNORMAL HIGH
WBC: 5.5

## 2011-02-26 LAB — DIFFERENTIAL
Basophils Absolute: 0
Basophils Relative: 1
Eosinophils Absolute: 0.1
Eosinophils Relative: 2
Lymphocytes Relative: 22
Lymphs Abs: 1.2
Monocytes Absolute: 0.4
Monocytes Relative: 7
Neutro Abs: 3.8
Neutrophils Relative %: 69

## 2011-02-26 LAB — BASIC METABOLIC PANEL
BUN: 23
CO2: 24
Calcium: 9.3
Chloride: 111
Creatinine, Ser: 1.51 — ABNORMAL HIGH
GFR calc Af Amer: 59 — ABNORMAL LOW
GFR calc non Af Amer: 48 — ABNORMAL LOW
Glucose, Bld: 62 — ABNORMAL LOW
Potassium: 4.1
Sodium: 142

## 2011-02-26 LAB — POCT HEMOGLOBIN-HEMACUE: Hemoglobin: 9.1 — ABNORMAL LOW

## 2011-02-26 LAB — CHROMOSOME ANALYSIS, BONE MARROW

## 2011-02-26 LAB — BONE MARROW EXAM

## 2011-03-02 LAB — GLUCOSE, CAPILLARY
Glucose-Capillary: 150 — ABNORMAL HIGH
Glucose-Capillary: 211 — ABNORMAL HIGH
Glucose-Capillary: 243 — ABNORMAL HIGH
Glucose-Capillary: 370 — ABNORMAL HIGH

## 2011-03-02 LAB — COMPREHENSIVE METABOLIC PANEL
ALT: 52
AST: 39 — ABNORMAL HIGH
Albumin: 3.5
Alkaline Phosphatase: 86
BUN: 31 — ABNORMAL HIGH
CO2: 26
Calcium: 9
Chloride: 103
Creatinine, Ser: 1.49
GFR calc Af Amer: 59 — ABNORMAL LOW
GFR calc non Af Amer: 49 — ABNORMAL LOW
Glucose, Bld: 404 — ABNORMAL HIGH
Potassium: 4.3
Sodium: 136
Total Bilirubin: 1.5 — ABNORMAL HIGH
Total Protein: 6.5

## 2011-03-02 LAB — BASIC METABOLIC PANEL
BUN: 24 — ABNORMAL HIGH
CO2: 27
Calcium: 8.8
Chloride: 107
Creatinine, Ser: 1.33
GFR calc Af Amer: >60
GFR calc non Af Amer: 56 — ABNORMAL LOW
Glucose, Bld: 212 — ABNORMAL HIGH
Potassium: 3.9
Sodium: 139

## 2011-03-02 LAB — CBC
HCT: 27.6 — ABNORMAL LOW
Hemoglobin: 9.1 — ABNORMAL LOW
MCHC: 32.8
MCV: 83.6
Platelets: 245
RBC: 3.3 — ABNORMAL LOW
RDW: 17 — ABNORMAL HIGH
WBC: 7

## 2011-03-02 LAB — MAGNESIUM: Magnesium: 2.1

## 2011-03-03 LAB — GLUCOSE, CAPILLARY: Glucose-Capillary: 221 — ABNORMAL HIGH

## 2011-03-04 ENCOUNTER — Encounter (HOSPITAL_BASED_OUTPATIENT_CLINIC_OR_DEPARTMENT_OTHER): Payer: Medicare Other | Admitting: Oncology

## 2011-03-04 ENCOUNTER — Other Ambulatory Visit: Payer: Self-pay | Admitting: Oncology

## 2011-03-04 DIAGNOSIS — I10 Essential (primary) hypertension: Secondary | ICD-10-CM

## 2011-03-04 DIAGNOSIS — C8293 Follicular lymphoma, unspecified, intra-abdominal lymph nodes: Secondary | ICD-10-CM

## 2011-03-04 DIAGNOSIS — Z7901 Long term (current) use of anticoagulants: Secondary | ICD-10-CM

## 2011-03-04 DIAGNOSIS — N289 Disorder of kidney and ureter, unspecified: Secondary | ICD-10-CM

## 2011-03-04 DIAGNOSIS — E119 Type 2 diabetes mellitus without complications: Secondary | ICD-10-CM

## 2011-03-04 DIAGNOSIS — I82409 Acute embolism and thrombosis of unspecified deep veins of unspecified lower extremity: Secondary | ICD-10-CM

## 2011-03-04 DIAGNOSIS — R109 Unspecified abdominal pain: Secondary | ICD-10-CM

## 2011-03-04 LAB — COMPREHENSIVE METABOLIC PANEL
ALT: 27
ALT: 14 U/L (ref 0–53)
AST: 21
AST: 16 U/L (ref 0–37)
Albumin: 3.4 — ABNORMAL LOW
Albumin: 3.1 g/dL — ABNORMAL LOW (ref 3.5–5.2)
Alkaline Phosphatase: 139 — ABNORMAL HIGH
Alkaline Phosphatase: 106 U/L (ref 39–117)
BUN: 39 — ABNORMAL HIGH
BUN: 42 mg/dL — ABNORMAL HIGH (ref 6–23)
CO2: 23
CO2: 27 mEq/L (ref 19–32)
Calcium: 9.3
Calcium: 8.9 mg/dL (ref 8.4–10.5)
Chloride: 108
Chloride: 102 mEq/L (ref 96–112)
Creatinine, Ser: 1.7 — ABNORMAL HIGH
Creatinine, Ser: 2.71 mg/dL — ABNORMAL HIGH (ref 0.50–1.35)
GFR calc Af Amer: 51 — ABNORMAL LOW
GFR calc non Af Amer: 42 — ABNORMAL LOW
Glucose, Bld: 309 — ABNORMAL HIGH
Glucose, Bld: 161 mg/dL — ABNORMAL HIGH (ref 70–99)
Potassium: 5.7 — ABNORMAL HIGH
Potassium: 4 mEq/L (ref 3.5–5.3)
Sodium: 137
Sodium: 138 mEq/L (ref 135–145)
Total Bilirubin: 1.1
Total Bilirubin: 0.1 mg/dL — ABNORMAL LOW (ref 0.3–1.2)
Total Protein: 6.3
Total Protein: 6.9 g/dL (ref 6.0–8.3)

## 2011-03-04 LAB — GLUCOSE, CAPILLARY
Glucose-Capillary: 143 — ABNORMAL HIGH
Glucose-Capillary: 166 — ABNORMAL HIGH
Glucose-Capillary: 238 — ABNORMAL HIGH
Glucose-Capillary: 243 — ABNORMAL HIGH
Glucose-Capillary: 251 — ABNORMAL HIGH
Glucose-Capillary: 271 — ABNORMAL HIGH
Glucose-Capillary: 294 — ABNORMAL HIGH
Glucose-Capillary: 317 — ABNORMAL HIGH
Glucose-Capillary: 353 — ABNORMAL HIGH
Glucose-Capillary: 372 — ABNORMAL HIGH
Glucose-Capillary: 96

## 2011-03-04 LAB — CBC
HCT: 27.5 — ABNORMAL LOW
HCT: 28.2 — ABNORMAL LOW
HCT: 29.9 — ABNORMAL LOW
Hemoglobin: 9.1 — ABNORMAL LOW
Hemoglobin: 9.2 — ABNORMAL LOW
Hemoglobin: 9.9 — ABNORMAL LOW
MCHC: 32.7
MCHC: 33
MCHC: 33.1
MCV: 83.2
MCV: 83.4
MCV: 83.6
Platelets: 183
Platelets: 195
Platelets: 235
RBC: 3.31 — ABNORMAL LOW
RBC: 3.37 — ABNORMAL LOW
RBC: 3.58 — ABNORMAL LOW
RDW: 22.7 — ABNORMAL HIGH
RDW: 23.2 — ABNORMAL HIGH
RDW: 23.5 — ABNORMAL HIGH
WBC: 26.7 — ABNORMAL HIGH
WBC: 35.5 — ABNORMAL HIGH
WBC: 9.4

## 2011-03-04 LAB — CBC WITH DIFFERENTIAL/PLATELET
BASO%: 0.5 % (ref 0.0–2.0)
Basophils Absolute: 0 10*3/uL (ref 0.0–0.1)
EOS%: 2.2 % (ref 0.0–7.0)
Eosinophils Absolute: 0.1 10*3/uL (ref 0.0–0.5)
HCT: 26.9 % — ABNORMAL LOW (ref 38.4–49.9)
HGB: 8.8 g/dL — ABNORMAL LOW (ref 13.0–17.1)
LYMPH%: 34 % (ref 14.0–49.0)
MCH: 25.1 pg — ABNORMAL LOW (ref 27.2–33.4)
MCHC: 32.7 g/dL (ref 32.0–36.0)
MCV: 76.6 fL — ABNORMAL LOW (ref 79.3–98.0)
MONO#: 0.4 10*3/uL (ref 0.1–0.9)
MONO%: 6.4 % (ref 0.0–14.0)
NEUT#: 3.6 10*3/uL (ref 1.5–6.5)
NEUT%: 56.9 % (ref 39.0–75.0)
Platelets: 268 10*3/uL (ref 140–400)
RBC: 3.51 10*6/uL — ABNORMAL LOW (ref 4.20–5.82)
RDW: 16.9 % — ABNORMAL HIGH (ref 11.0–14.6)
WBC: 6.4 10*3/uL (ref 4.0–10.3)
lymph#: 2.2 10*3/uL (ref 0.9–3.3)
nRBC: 0 % (ref 0–0)

## 2011-03-04 LAB — BASIC METABOLIC PANEL
BUN: 30 — ABNORMAL HIGH
BUN: 38 — ABNORMAL HIGH
CO2: 22
CO2: 25
Calcium: 8.8
Calcium: 9
Chloride: 109
Chloride: 109
Creatinine, Ser: 1.47
Creatinine, Ser: 1.64 — ABNORMAL HIGH
GFR calc Af Amer: 53 — ABNORMAL LOW
GFR calc Af Amer: >60
GFR calc non Af Amer: 44 — ABNORMAL LOW
GFR calc non Af Amer: 50 — ABNORMAL LOW
Glucose, Bld: 269 — ABNORMAL HIGH
Glucose, Bld: 275 — ABNORMAL HIGH
Potassium: 4.6
Potassium: 4.6
Sodium: 136
Sodium: 138

## 2011-03-16 ENCOUNTER — Other Ambulatory Visit (HOSPITAL_COMMUNITY): Payer: Self-pay | Admitting: Internal Medicine

## 2011-05-08 ENCOUNTER — Telehealth: Payer: Self-pay | Admitting: *Deleted

## 2011-05-08 NOTE — Telephone Encounter (Signed)
Patient calling to say his nephrologist told him his iron was low and to call dr Alen Blew. appt already on the schedule for Jun 09, 2011 for lab and to see dr Alen Blew on Jun 11, 2011. Ok per dr Alen Blew.

## 2011-05-20 ENCOUNTER — Encounter (HOSPITAL_COMMUNITY): Payer: Self-pay | Admitting: *Deleted

## 2011-05-20 ENCOUNTER — Other Ambulatory Visit: Payer: Self-pay

## 2011-05-20 ENCOUNTER — Inpatient Hospital Stay (HOSPITAL_COMMUNITY)
Admission: EM | Admit: 2011-05-20 | Discharge: 2011-05-25 | DRG: 291 | Disposition: A | Discharge status: Home Health | Payer: Medicare Other | Attending: Internal Medicine | Admitting: Internal Medicine

## 2011-05-20 ENCOUNTER — Emergency Department (HOSPITAL_COMMUNITY): Payer: Medicare Other

## 2011-05-20 DIAGNOSIS — N039 Chronic nephritic syndrome with unspecified morphologic changes: Secondary | ICD-10-CM | POA: Diagnosis present

## 2011-05-20 DIAGNOSIS — Z7901 Long term (current) use of anticoagulants: Secondary | ICD-10-CM

## 2011-05-20 DIAGNOSIS — E119 Type 2 diabetes mellitus without complications: Secondary | ICD-10-CM | POA: Diagnosis present

## 2011-05-20 DIAGNOSIS — N183 Chronic kidney disease, stage 3 unspecified: Secondary | ICD-10-CM | POA: Diagnosis present

## 2011-05-20 DIAGNOSIS — I5033 Acute on chronic diastolic (congestive) heart failure: Principal | ICD-10-CM | POA: Diagnosis present

## 2011-05-20 DIAGNOSIS — D631 Anemia in chronic kidney disease: Secondary | ICD-10-CM | POA: Diagnosis present

## 2011-05-20 DIAGNOSIS — E876 Hypokalemia: Secondary | ICD-10-CM | POA: Diagnosis present

## 2011-05-20 DIAGNOSIS — C8589 Other specified types of non-Hodgkin lymphoma, extranodal and solid organ sites: Secondary | ICD-10-CM | POA: Diagnosis present

## 2011-05-20 DIAGNOSIS — I872 Venous insufficiency (chronic) (peripheral): Secondary | ICD-10-CM | POA: Diagnosis present

## 2011-05-20 DIAGNOSIS — I129 Hypertensive chronic kidney disease with stage 1 through stage 4 chronic kidney disease, or unspecified chronic kidney disease: Secondary | ICD-10-CM | POA: Diagnosis present

## 2011-05-20 DIAGNOSIS — N189 Chronic kidney disease, unspecified: Secondary | ICD-10-CM | POA: Diagnosis present

## 2011-05-20 DIAGNOSIS — I10 Essential (primary) hypertension: Secondary | ICD-10-CM | POA: Diagnosis present

## 2011-05-20 DIAGNOSIS — E1121 Type 2 diabetes mellitus with diabetic nephropathy: Secondary | ICD-10-CM | POA: Diagnosis present

## 2011-05-20 DIAGNOSIS — E8809 Other disorders of plasma-protein metabolism, not elsewhere classified: Secondary | ICD-10-CM | POA: Diagnosis present

## 2011-05-20 DIAGNOSIS — J189 Pneumonia, unspecified organism: Secondary | ICD-10-CM | POA: Diagnosis present

## 2011-05-20 DIAGNOSIS — N058 Unspecified nephritic syndrome with other morphologic changes: Secondary | ICD-10-CM | POA: Diagnosis present

## 2011-05-20 DIAGNOSIS — IMO0001 Reserved for inherently not codable concepts without codable children: Secondary | ICD-10-CM | POA: Diagnosis present

## 2011-05-20 DIAGNOSIS — D539 Nutritional anemia, unspecified: Secondary | ICD-10-CM | POA: Diagnosis present

## 2011-05-20 DIAGNOSIS — E1129 Type 2 diabetes mellitus with other diabetic kidney complication: Secondary | ICD-10-CM | POA: Diagnosis present

## 2011-05-20 DIAGNOSIS — R0902 Hypoxemia: Secondary | ICD-10-CM

## 2011-05-20 DIAGNOSIS — I509 Heart failure, unspecified: Secondary | ICD-10-CM

## 2011-05-20 DIAGNOSIS — N19 Unspecified kidney failure: Secondary | ICD-10-CM

## 2011-05-20 DIAGNOSIS — N179 Acute kidney failure, unspecified: Secondary | ICD-10-CM | POA: Diagnosis present

## 2011-05-20 LAB — CBC
HCT: 25.1 % — ABNORMAL LOW (ref 39.0–52.0)
HCT: 24.9 % — ABNORMAL LOW (ref 39.0–52.0)
Hemoglobin: 7.9 g/dL — ABNORMAL LOW (ref 13.0–17.0)
Hemoglobin: 7.9 g/dL — ABNORMAL LOW (ref 13.0–17.0)
MCH: 25.2 pg — ABNORMAL LOW (ref 26.0–34.0)
MCH: 25.4 pg — ABNORMAL LOW (ref 26.0–34.0)
MCHC: 31.5 g/dL (ref 30.0–36.0)
MCHC: 31.7 g/dL (ref 30.0–36.0)
MCV: 80.2 fL (ref 78.0–100.0)
MCV: 80.1 fL (ref 78.0–100.0)
Platelets: 293 10*3/uL (ref 150–400)
Platelets: 295 10*3/uL (ref 150–400)
RBC: 3.13 MIL/uL — ABNORMAL LOW (ref 4.22–5.81)
RBC: 3.11 MIL/uL — ABNORMAL LOW (ref 4.22–5.81)
RDW: 17.1 % — ABNORMAL HIGH (ref 11.5–15.5)
RDW: 17 % — ABNORMAL HIGH (ref 11.5–15.5)
WBC: 5.7 10*3/uL (ref 4.0–10.5)
WBC: 5.7 10*3/uL (ref 4.0–10.5)

## 2011-05-20 LAB — DIFFERENTIAL
Basophils Absolute: 0 10*3/uL (ref 0.0–0.1)
Basophils Absolute: 0 10*3/uL (ref 0.0–0.1)
Basophils Relative: 0 % (ref 0–1)
Basophils Relative: 0 % (ref 0–1)
Eosinophils Absolute: 0.1 10*3/uL (ref 0.0–0.7)
Eosinophils Absolute: 0.1 10*3/uL (ref 0.0–0.7)
Eosinophils Relative: 1 % (ref 0–5)
Eosinophils Relative: 1 % (ref 0–5)
Lymphocytes Relative: 17 % (ref 12–46)
Lymphocytes Relative: 21 % (ref 12–46)
Lymphs Abs: 1 10*3/uL (ref 0.7–4.0)
Lymphs Abs: 1.2 10*3/uL (ref 0.7–4.0)
Monocytes Absolute: 0.4 10*3/uL (ref 0.1–1.0)
Monocytes Absolute: 0.4 10*3/uL (ref 0.1–1.0)
Monocytes Relative: 7 % (ref 3–12)
Monocytes Relative: 7 % (ref 3–12)
Neutro Abs: 4.2 10*3/uL (ref 1.7–7.7)
Neutro Abs: 4 10*3/uL (ref 1.7–7.7)
Neutrophils Relative %: 75 % (ref 43–77)
Neutrophils Relative %: 70 % (ref 43–77)

## 2011-05-20 LAB — COMPREHENSIVE METABOLIC PANEL
ALT: 16 U/L (ref 0–53)
AST: 17 U/L (ref 0–37)
Albumin: 2.5 g/dL — ABNORMAL LOW (ref 3.5–5.2)
Alkaline Phosphatase: 122 U/L — ABNORMAL HIGH (ref 39–117)
BUN: 31 mg/dL — ABNORMAL HIGH (ref 6–23)
CO2: 26 mEq/L (ref 19–32)
Calcium: 8.8 mg/dL (ref 8.4–10.5)
Chloride: 103 mEq/L (ref 96–112)
Creatinine, Ser: 2.69 mg/dL — ABNORMAL HIGH (ref 0.50–1.35)
GFR calc Af Amer: 28 mL/min — ABNORMAL LOW (ref >90)
GFR calc non Af Amer: 24 mL/min — ABNORMAL LOW (ref >90)
Glucose, Bld: 327 mg/dL — ABNORMAL HIGH (ref 70–99)
Potassium: 3.6 mEq/L (ref 3.5–5.1)
Sodium: 137 mEq/L (ref 135–145)
Total Bilirubin: 0.3 mg/dL (ref 0.3–1.2)
Total Protein: 6.5 g/dL (ref 6.0–8.3)

## 2011-05-20 LAB — PRO B NATRIURETIC PEPTIDE: Pro B Natriuretic peptide (BNP): 6513 pg/mL — ABNORMAL HIGH (ref 0–125)

## 2011-05-20 LAB — URINE MICROSCOPIC-ADD ON

## 2011-05-20 LAB — POCT I-STAT TROPONIN I: Troponin i, poc: 0.06 ng/mL (ref 0.00–0.08)

## 2011-05-20 LAB — URINALYSIS, ROUTINE W REFLEX MICROSCOPIC
Bilirubin Urine: NEGATIVE
Glucose, UA: >1000 mg/dL — AB
Ketones, ur: NEGATIVE mg/dL
Leukocytes, UA: NEGATIVE
Nitrite: NEGATIVE
Protein, ur: >300 mg/dL — AB
Specific Gravity, Urine: 1.022 (ref 1.005–1.030)
Urobilinogen, UA: 0.2 mg/dL (ref 0.0–1.0)
pH: 7.5 (ref 5.0–8.0)

## 2011-05-20 LAB — PROTIME-INR
INR: 2.36 — ABNORMAL HIGH (ref 0.00–1.49)
Prothrombin Time: 26.2 seconds — ABNORMAL HIGH (ref 11.6–15.2)

## 2011-05-20 LAB — TYPE AND SCREEN
ABO/RH(D): A POS
Antibody Screen: NEGATIVE

## 2011-05-20 LAB — GLUCOSE, CAPILLARY: Glucose-Capillary: 287 mg/dL — ABNORMAL HIGH (ref 70–99)

## 2011-05-20 LAB — OCCULT BLOOD, POC DEVICE: Fecal Occult Bld: NEGATIVE

## 2011-05-20 LAB — APTT: aPTT: 84 seconds — ABNORMAL HIGH (ref 24–37)

## 2011-05-20 MED ORDER — SODIUM CHLORIDE 0.9 % IV SOLN: 250.0000 mL | INTRAVENOUS | Status: DC | PRN

## 2011-05-20 MED ORDER — AZITHROMYCIN 250 MG PO TABS
500.0000 mg | ORAL_TABLET | Freq: Once | ORAL | Status: AC
  Administered 2011-05-20: 500 mg via ORAL
  Filled 2011-05-20 (×2): qty 1

## 2011-05-20 MED ORDER — DEXTROSE 5 % IV SOLN
500.0000 mg | INTRAVENOUS | Status: DC
  Administered 2011-05-21 – 2011-05-24 (×4): 500 mg via INTRAVENOUS
  Filled 2011-05-20 (×5): qty 500

## 2011-05-20 MED ORDER — DEXTROSE 5 % IV SOLN
1.0000 g | INTRAVENOUS | Status: DC
  Administered 2011-05-21 – 2011-05-24 (×4): 1 g via INTRAVENOUS
  Filled 2011-05-20 (×5): qty 10

## 2011-05-20 MED ORDER — WARFARIN SODIUM 5 MG PO TABS: 5.0000 mg | ORAL_TABLET | Freq: Every day | ORAL | Status: DC

## 2011-05-20 MED ORDER — ACETAMINOPHEN 500 MG PO TABS: 500.0000 mg | ORAL_TABLET | Freq: Four times a day (QID) | ORAL | Status: DC | PRN

## 2011-05-20 MED ORDER — FUROSEMIDE 10 MG/ML IJ SOLN
80.0000 mg | Freq: Four times a day (QID) | INTRAMUSCULAR | Status: DC
  Administered 2011-05-20 – 2011-05-21 (×2): 80 mg via INTRAVENOUS
  Filled 2011-05-20 (×6): qty 8

## 2011-05-20 MED ORDER — SODIUM CHLORIDE 0.9 % IJ SOLN
3.0000 mL | Freq: Two times a day (BID) | INTRAMUSCULAR | Status: DC
  Administered 2011-05-20 – 2011-05-25 (×10): 3 mL via INTRAVENOUS

## 2011-05-20 MED ORDER — AMLODIPINE BESY-BENAZEPRIL HCL 5-20 MG PO CAPS: 1.0000 | ORAL_CAPSULE | Freq: Every day | ORAL | Status: DC

## 2011-05-20 MED ORDER — FUROSEMIDE 10 MG/ML IJ SOLN: 40.0000 mg | Freq: Once | INTRAMUSCULAR | Status: DC

## 2011-05-20 MED ORDER — POTASSIUM CHLORIDE CRYS ER 20 MEQ PO TBCR
20.0000 meq | EXTENDED_RELEASE_TABLET | Freq: Every day | ORAL | Status: DC
  Administered 2011-05-20 – 2011-05-21 (×2): 20 meq via ORAL
  Filled 2011-05-20 (×3): qty 1

## 2011-05-20 MED ORDER — AMLODIPINE BESYLATE 5 MG PO TABS
5.0000 mg | ORAL_TABLET | Freq: Every day | ORAL | Status: DC
  Administered 2011-05-20 – 2011-05-25 (×6): 5 mg via ORAL
  Filled 2011-05-20 (×6): qty 1

## 2011-05-20 MED ORDER — BENZONATATE 100 MG PO CAPS
200.0000 mg | ORAL_CAPSULE | Freq: Once | ORAL | Status: AC
  Administered 2011-05-20: 200 mg via ORAL

## 2011-05-20 MED ORDER — DEXTROSE 5 % IV SOLN
1.0000 g | Freq: Once | INTRAVENOUS | Status: AC
  Administered 2011-05-20: 1 g via INTRAVENOUS
  Filled 2011-05-20: qty 10

## 2011-05-20 MED ORDER — ACETAMINOPHEN 325 MG PO TABS
650.0000 mg | ORAL_TABLET | ORAL | Status: DC | PRN
  Administered 2011-05-21: 650 mg via ORAL
  Filled 2011-05-20: qty 2

## 2011-05-20 MED ORDER — SODIUM CHLORIDE 0.9 % IJ SOLN: 3.0000 mL | INTRAMUSCULAR | Status: DC | PRN

## 2011-05-20 MED ORDER — ONDANSETRON HCL 4 MG/2ML IJ SOLN: 4.0000 mg | Freq: Four times a day (QID) | INTRAMUSCULAR | Status: DC | PRN

## 2011-05-20 MED ORDER — INSULIN ASPART 100 UNIT/ML ~~LOC~~ SOLN
0.0000 [IU] | Freq: Three times a day (TID) | SUBCUTANEOUS | Status: DC
  Administered 2011-05-21: 4 [IU] via SUBCUTANEOUS
  Administered 2011-05-21: 3 [IU] via SUBCUTANEOUS
  Administered 2011-05-21: 4 [IU] via SUBCUTANEOUS
  Administered 2011-05-22: 3 [IU] via SUBCUTANEOUS
  Administered 2011-05-22 (×2): 4 [IU] via SUBCUTANEOUS
  Administered 2011-05-23: 3 [IU] via SUBCUTANEOUS
  Administered 2011-05-23: 4 [IU] via SUBCUTANEOUS
  Administered 2011-05-24: 7 [IU] via SUBCUTANEOUS
  Administered 2011-05-24: 3 [IU] via SUBCUTANEOUS

## 2011-05-20 MED ORDER — INSULIN ASPART 100 UNIT/ML ~~LOC~~ SOLN
0.0000 [IU] | Freq: Every day | SUBCUTANEOUS | Status: DC
Start: 2011-05-20 — End: 2011-05-25
  Administered 2011-05-20: 3 [IU] via SUBCUTANEOUS
  Filled 2011-05-20: qty 3

## 2011-05-20 MED ORDER — FUROSEMIDE 10 MG/ML IJ SOLN
40.0000 mg | Freq: Once | INTRAMUSCULAR | Status: AC
  Administered 2011-05-20: 40 mg via INTRAVENOUS
  Filled 2011-05-20: qty 4

## 2011-05-20 MED ORDER — INSULIN GLARGINE 100 UNIT/ML ~~LOC~~ SOLN
40.0000 [IU] | Freq: Every day | SUBCUTANEOUS | Status: DC
  Administered 2011-05-20 – 2011-05-21 (×2): 40 [IU] via SUBCUTANEOUS
  Filled 2011-05-20: qty 3

## 2011-05-20 MED ORDER — BENZONATATE 100 MG PO CAPS
ORAL_CAPSULE | ORAL | Status: AC
  Filled 2011-05-20: qty 2

## 2011-05-20 MED ORDER — POLYSACCHARIDE IRON 150 MG PO CAPS
150.0000 mg | ORAL_CAPSULE | Freq: Every day | ORAL | Status: DC
  Administered 2011-05-20 – 2011-05-25 (×6): 150 mg via ORAL
  Filled 2011-05-20 (×6): qty 1

## 2011-05-20 MED ORDER — BENAZEPRIL HCL 20 MG PO TABS
20.0000 mg | ORAL_TABLET | Freq: Every day | ORAL | Status: DC
  Administered 2011-05-20 – 2011-05-21 (×2): 20 mg via ORAL
  Filled 2011-05-20 (×2): qty 1

## 2011-05-20 NOTE — ED Provider Notes (Addendum)
History     CSN: [id number] Arrival date & time: 05/20/2011 12:27 PM   First MD Initiated Contact with Patient 05/20/11 1238      Chief Complaint  Patient presents with  . Shortness of Breath    (Consider location/radiation/quality/duration/timing/severity/associated sxs/prior treatment) HPI Patient is a 60 year old male who presents today complaining of shortness of breath over the past week. He saw his primary care physician for this and was told to continue taking his iron pills. Patient has history of heart failure as well as renal failure, type 2 diabetes, and anemia. He is followed by Dr. Osker Mason for his anemia and has been transfused in the past. He denies any blood in his stools or dark tarry stools. The patient complains of 5/10 epigastric pain radiating into his chest. He also complains of cough. There is nothing that seems to make the patient's symptoms better or worse. Upon arrival the patient did have an oxygen saturation of 81% on room air. He is not normally on oxygen. He does feel better now that he is on oxygen. His symptoms are made worsened by lying flat. Patient also complains of increased bilateral peripheral edema and has noted a 10-15 pounds weight increase. He is on Coumadin for prior history of a blood clot below the right knee. His levels have been within normal limits per the patient. He has no history of spontaneous internal bleeding. There are no other associated or modifying factors the Past Medical History  Diagnosis Date  . Cancer   . Diabetes mellitus   . DVT (deep venous thrombosis)   . Diabetic nephropathy   . Diabetic retinopathy   . Hypertension   . Hyperlipidemia   . Non Hodgkin's lymphoma     Tx 2009    History reviewed. No pertinent past surgical history.  History reviewed. No pertinent family history.  History  Substance Use Topics  . Smoking status: Never Smoker   . Smokeless tobacco: Never Used  . Alcohol Use: No      Review of  Systems  Constitutional: Positive for fatigue.  HENT: Negative.   Eyes: Negative.   Gastrointestinal: Positive for abdominal pain. Negative for nausea, vomiting, diarrhea, constipation and blood in stool.  Genitourinary: Negative.   Musculoskeletal: Negative.   Skin: Negative.   Neurological: Negative.   Hematological: Negative.   Psychiatric/Behavioral: Negative.   All other systems reviewed and are negative.    Allergies  Review of patient's allergies indicates no known allergies.  Home Medications   Current Outpatient Rx  Name Route Sig Dispense Refill  . ACETAMINOPHEN 500 MG PO TABS Oral Take 1,000 mg by mouth every 6 (six) hours as needed. For headache     . AMLODIPINE BESY-BENAZEPRIL HCL 5-20 MG PO CAPS Oral Take 1 capsule by mouth daily.      Marland Kitchen CALCITRIOL 0.25 MCG PO CAPS Oral Take 0.25 mcg by mouth daily.      . INSULIN LISPRO (HUMAN) 100 UNIT/ML [place] SOLN Subcutaneous Inject 10-75 Units into the skin 3 (three) times daily before meals. Inject 75 units every day in the morning and at lunch and inject 10 units at bedtime     . POLYSACCHARIDE IRON 150 MG PO CAPS Oral Take 1 capsule (150 mg total) by mouth daily. 30 each 2  . POTASSIUM CHLORIDE CRYS CR 20 MEQ PO TBCR Oral Take 1 tablet (20 mEq total) by mouth daily. 30 tablet 1  . TORSEMIDE 10 MG PO TABS Oral Take 5 tablets (50 mg  total) by mouth daily. 150 tablet 1  . WARFARIN SODIUM 5 MG PO TABS Oral Take 5 mg by mouth daily. Take one and one-half (7.5mg ) tablet by mouth on Sundays, Tuesdays, Thursdays, and Saturdays, then take two tablets (10mg ) on Mondays, Wednesdays, and Fridays or take as directed by coumadin clinic.      BP 106/84  Pulse 94  Temp(Src) 98.4 F (36.9 C) (Oral)  Resp 20  Ht 6\' 3"  (1.905 m)  Wt 320 lb (145.151 kg)  BMI 40.00 kg/m2  SpO2 97%  Physical Exam  Nursing note and vitals reviewed. Constitutional: He is oriented to person, place, and time. He appears well-developed and well-nourished. No  distress.  HENT:  Head: Normocephalic and atraumatic.  Mouth/Throat: No oropharyngeal exudate.       Hoarse voice which patient reports is his usual  Eyes: Conjunctivae and EOM are normal. Pupils are equal, round, and reactive to light.  Neck: Normal range of motion.  Cardiovascular: Normal rate, regular rhythm, normal heart sounds and intact distal pulses.  Exam reveals no gallop and no friction rub.   No murmur heard. Pulmonary/Chest: Effort normal. He has decreased breath sounds. He has no wheezes. He has no rhonchi. He has no rales.  Abdominal: Soft. Bowel sounds are normal. He exhibits no distension. There is no tenderness. There is no rebound and no guarding.  Genitourinary: Rectum normal. Guaiac negative stool.  Musculoskeletal: Normal range of motion. He exhibits edema. He exhibits no tenderness.       Bilateral 2+ pitting edema noted below the knee  Neurological: He is alert and oriented to person, place, and time. No cranial nerve deficit. He exhibits normal muscle tone. Coordination normal.  Skin: Skin is warm and dry. No rash noted.  Psychiatric: He has a normal mood and affect.    ED Course  Procedures (including critical care time)   Date: 05/20/2011  Rate: 97  Rhythm: normal sinus rhythm  QRS Axis: right  Intervals: normal  ST/T Wave abnormalities: nonspecific ST/T changes  Conduction Disutrbances:right bundle branch block  Narrative Interpretation:   Old EKG Reviewed: unchanged  Labs Reviewed  CBC - Abnormal; Notable for the following:    RBC 3.13 (*)    Hemoglobin 7.9 (*)    HCT 25.1 (*)    MCH 25.2 (*)    RDW 17.1 (*)    All other components within normal limits  COMPREHENSIVE METABOLIC PANEL - Abnormal; Notable for the following:    Glucose, Bld 327 (*)    BUN 31 (*)    Creatinine, Ser 2.69 (*)    Albumin 2.5 (*)    Alkaline Phosphatase 122 (*)    GFR calc non Af Amer 24 (*)    GFR calc Af Amer 28 (*)    All other components within normal limits    PROTIME-INR - Abnormal; Notable for the following:    Prothrombin Time 26.2 (*)    INR 2.36 (*)    All other components within normal limits  PRO B NATRIURETIC PEPTIDE - Abnormal; Notable for the following:    Pro B Natriuretic peptide (BNP) 6513.0 (*)    All other components within normal limits  URINALYSIS, ROUTINE W REFLEX MICROSCOPIC - Abnormal; Notable for the following:    Glucose, UA >1000 (*)    Hgb urine dipstick MODERATE (*)    Protein, ur >300 (*)    All other components within normal limits  DIFFERENTIAL  TYPE AND SCREEN  POCT I-STAT TROPONIN I  OCCULT BLOOD, POC DEVICE  URINE MICROSCOPIC-ADD ON  I-STAT TROPONIN I  I-STAT TROPONIN I  I-STAT TROPONIN I  POCT OCCULT BLOOD STOOL, DEVICE  I-STAT TROPONIN I   Dg Chest Port 1v Same Day  05/20/2011  *RADIOLOGY REPORT*  Clinical Data: Cough, shortness of breath, history of lymphoma  PORTABLE CHEST - 1 VIEW SAME DAY  Comparison: 01/13/2011  Findings: Borderline cardiomegaly noted.  Bilateral central airspace disease noted with asymmetric appearance more prominent in the left upper lobe perihilar and right lower lobe infrahilar. This are suspicious for bilateral pneumonia rather than asymmetric pulmonary edema.  Clinical correlation is necessary.  IMPRESSION:  Bilateral central airspace disease noted with asymmetric appearance more prominent in the left upper lobe perihilar and right lower lobe infrahilar.  This are suspicious for bilateral pneumonia rather than asymmetric pulmonary edema.  Clinical correlation is necessary.Follow-up to resolution is recommended.  Original Report Authenticated By: Lahoma Crocker, [name]     1. Hypoxia   2. CAP (community acquired pneumonia)   3. Heart failure with acute decompensation, type unknown       MDM  Patient presented with hypoxia. He's not normally on home O2. Patient improved with 2 L of O2 per nasal cannula. Workup for possible fluid overload as well as infectious causes of his hypoxia  were pursued. Patient did have hyperglycemia to 327 today. He also has evidence of renal insufficiency however his creatinine was 2.69 which is significantly improved to previous values. Patient had chest x-ray concerning for a bilateral lower lobe pneumonia versus asymmetrical pulmonary edema. Symptoms were consistent with both. Patient was treated for community acquired pneumonia with azithromycin as well as Rocephin. He was given a dose of Lasix 80 mg IV. Patient refused nitroglycerin. Patient did not feel that he needed any pain medicines. Given his history of anemia a rectal exam was performed. This is guaiac negative. Patient did have anemia however hemoglobin was stable at 7.9 compared to recent doses in the eights. Patient had elevated BNP. I did speak with the oncologist on call for Dr. Osker Mason to notify him of the patient's presence.  Patient did not require further intervention or consultation for their group this time. His hemoglobin is normally kept around 8. Patient did have a negative troponin. His EKG was unchanged from previous. INR was also therapeutic at 2.39. I do not have suspicion for PE today. This is particularly the case given the patient had other etiology for his hypoxia. I spoke with Dr. Joya Salm. He was happy to admit the patient today. Temporary holding orders were placed.        Chauncy Passy, MD 05/20/11 1624  Chauncy Passy, MD 05/20/11 [phone]

## 2011-05-20 NOTE — ED Notes (Signed)
Pt reports shob starting approx one week ago, told by PCP to continue iron pills. Sts shob worsened over last few days. 82% RA upon arrival to dept. 93% on 2L.

## 2011-05-20 NOTE — Progress Notes (Signed)
Clarification CM noted pt has Medicare coverage therefore pt is not eligible for medication assistance [facility] pharmacist was unaware of medicare status when stated pt eligible pt updated

## 2011-05-20 NOTE — ED Notes (Signed)
According, to report given by NT, [name], patient is aware of need for urine specimen. Patient is unable to void at this time. Patient has urinal at bedside to obtain specimen.

## 2011-05-20 NOTE — Progress Notes (Signed)
ED CM noted admission RN's CM consult.  Solmon Ice, [place] states pt is eligible for [facility] medication indigent program.

## 2011-05-20 NOTE — H&P (Signed)
PCP:   Geoffery Lyons, MD, MD   Chief Complaint:  Progressive shortness of breath  HPI: [name] is a patient well-known to myself with an extensive past medical history including long-standing diabetes mellitus type 2, essential hypertension, hyperlipidemia, non-Hodgkin's lymphoma presenting at this time with progressive dyspnea. He has notoriously poor compliance and often runs out of his medications. We have made multiple attempts at assisting him with patient assistance program and health services with little success. He is certainly always good spirits and tries very hard but is limited in his financial abilities. He's had progressive gain in weight, increasing lower [place] he swelling and dyspnea over the last 3-5 days. He's had no cough or sputum. He has had no fevers. He has had some chest tightness. He presented to the emergency room without calling our office for further evaluation and was found to be hypoxemic with bilateral pulmonary infiltrates. We were called for admission.  Review of Systems:  The patient denies anorexia, fever, weight loss,, vision loss, decreased hearing, hoarseness, chest pain, syncope, dyspnea on exertion, , balance deficits, hemoptysis, abdominal pain, melena, hematochezia, severe indigestion/heartburn, hematuria, incontinence, genital sores, muscle weakness, suspicious skin lesions, transient blindness, difficulty walking, depression, unusual weight change, abnormal bleeding, enlarged lymph nodes, angioedema, and breast masses.  Past Medical History: Past Medical History  Diagnosis Date  . Cancer   . Diabetes mellitus   . DVT (deep venous thrombosis)   . Diabetic nephropathy   . Diabetic retinopathy   . Hypertension   . Hyperlipidemia   . Non Hodgkin's lymphoma     Tx 2009  . History of cardiac catheterization 2010  CT anterior chest 2011 negative  Colonoscopy 2010  Left toe with debridement in the remote past     History reviewed. No pertinent  past surgical history.  Medications: Prior to Admission medications   Medication Sig Start Date End Date Taking? Authorizing Provider  acetaminophen (TYLENOL) 500 MG tablet Take 1,000 mg by mouth every 6 (six) hours as needed. For headache    Yes Historical Provider, MD  amLODipine-benazepril (LOTREL) 5-20 MG per capsule Take 1 capsule by mouth daily.     Yes Historical Provider, MD  calcitRIOL (ROCALTROL) 0.25 MCG capsule Take 0.25 mcg by mouth daily.     Yes Historical Provider, MD  insulin lispro (HUMALOG) 100 UNIT/ML injection Inject 10-75 Units into the skin 3 (three) times daily before meals. Inject 75 units every day in the morning and at lunch and inject 10 units at bedtime    Yes Historical Provider, MD  polysaccharide iron (NIFEREX) 150 MG CAPS capsule Take 1 capsule (150 mg total) by mouth daily. 01/16/11  Yes Estela Isaac Bliss  potassium chloride SA (K-DUR,KLOR-CON) 20 MEQ tablet Take 1 tablet (20 mEq total) by mouth daily. 01/16/11 01/16/12 Yes Estela Isaac Bliss  torsemide (DEMADEX) 10 MG tablet Take 5 tablets (50 mg total) by mouth daily. 01/16/11 01/16/12 Yes Estela Isaac Bliss  warfarin (COUMADIN) 5 MG tablet Take 5 mg by mouth daily. Take one and one-half (7.5mg ) tablet by mouth on Sundays, Tuesdays, Thursdays, and Saturdays, then take two tablets (10mg ) on Mondays, Wednesdays, and Fridays or take as directed by coumadin clinic.   Yes Historical Provider, MD    Allergies:  No Known Allergies  Social History:  reports that he has never smoked. He has never used smokeless tobacco. He reports that he does not drink alcohol or use illicit drugs.  Family History: History reviewed. No pertinent family history.  Physical  Exam: Filed Vitals:   05/20/11 1231 05/20/11 1525 05/20/11 1556 05/20/11 1747  BP: 178/91 150/67 160/84 149/78  Pulse:  96 94 99  Temp:  98.1 F (36.7 C) 98.4 F (36.9 C)   TempSrc:  Oral    Resp:   20 22  Height:  6\' 3"  (1.905 m)    Weight:   145.151 kg (320 lb)    SpO2:  89% 97% 100%   General appearance: alert, cooperative and no distress Head: Normocephalic, without obvious abnormality, atraumatic Eyes: conjunctivae/corneas clear. PERRL, EOM's intact.  Nose: Nares normal. Septum midline. Mucosa normal. No drainage or sinus tenderness. Throat: lips, mucosa, and tongue normal; teeth and gums normal Neck: no adenopathy, no carotid bruit, no JVD and thyroid not enlarged, symmetric, no tenderness/mass/nodules Resp: rales bibasilar Cardio: regular rate and rhythm, S1, S2 normal, no murmur, click, rub or gallop GI: soft, non-tender; bowel sounds normal; no masses,  no organomegaly Extremities: extremities normal, atraumatic, no cyanosis or 3+ edema bilaterally with venous stasis changes Pulses: 2+ and symmetric Lymph nodes: Cervical adenopathy: no cervical lymphadenopathy Neurologic: Alert and oriented X 3, normal strength and tone. Normal symmetric reflexes.     Labs on Admission:   [facility] 05/20/11 1240  NA 137  K 3.6  CL 103  CO2 26  GLUCOSE 327*  BUN 31*  CREATININE 2.69*  CALCIUM 8.8  MG --  PHOS --    Basename 05/20/11 1240  AST 17  ALT 16  ALKPHOS 122*  BILITOT 0.3  PROT 6.5  ALBUMIN 2.5*   No results found for this basename: LIPASE:2,AMYLASE:2 in the last 72 hours  Basename 05/20/11 1240  WBC 5.7  NEUTROABS 4.2  HGB 7.9*  HCT 25.1*  MCV 80.2  PLT 293   No results found for this basename: CKTOTAL:3,CKMB:3,CKMBINDEX:3,TROPONINI:3 in the last 72 hours No results found for this basename: TSH,T4TOTAL,FREET3,T3FREE,THYROIDAB in the last 72 hours No results found for this basename: VITAMINB12:2,FOLATE:2,FERRITIN:2,TIBC:2,IRON:2,RETICCTPCT:2 in the last 72 hours  Radiological Exams on Admission: Dg Chest Port 1v Same Day  05/20/2011  *RADIOLOGY REPORT*  Clinical Data: Cough, shortness of breath, history of lymphoma  PORTABLE CHEST - 1 VIEW SAME DAY  Comparison: 01/13/2011  Findings: Borderline  cardiomegaly noted.  Bilateral central airspace disease noted with asymmetric appearance more prominent in the left upper lobe perihilar and right lower lobe infrahilar. This are suspicious for bilateral pneumonia rather than asymmetric pulmonary edema.  Clinical correlation is necessary.  IMPRESSION:  Bilateral central airspace disease noted with asymmetric appearance more prominent in the left upper lobe perihilar and right lower lobe infrahilar.  This are suspicious for bilateral pneumonia rather than asymmetric pulmonary edema.  Clinical correlation is necessary.Follow-up to resolution is recommended.  Original Report Authenticated By: Lahoma Crocker, [name]   Orders placed during the hospital encounter of 05/20/11  . EKG 12-LEAD  . 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  Deren Degrazia A 05/20/2011, 6:17 PM

## 2011-05-20 NOTE — Progress Notes (Signed)
[name], pt brother and he voiced understanding when clarified pt not eligible for medication assistance Referred to DSS, health department and individual drug patient assistance programs

## 2011-05-20 NOTE — Progress Notes (Signed)
Spoke with pt who states Dr Reynaldo Minium is his pcp. Discussed indigent medication annual assistance

## 2011-05-20 NOTE — ED Notes (Signed)
Pt placed on oxygen @ 2L/[place]

## 2011-05-20 NOTE — Progress Notes (Signed)
ANTICOAGULATION CONSULT NOTE - Initial Consult  Pharmacy Consult for warfarin Indication: hx DVT  No Known Allergies  Patient Measurements: Height: 6\' 3"  (190.5 cm) Weight: 320 lb (145.151 kg) IBW/kg (Calculated) : 84.5   Vital Signs: Temp: 98.4 F (36.9 C) (12/19 1556) Temp src: Oral (12/19 1525) BP: 149/78 mmHg (12/19 1747) Pulse Rate: 99  (12/19 1747)  Labs:  Basename 05/20/11 1240  HGB 7.9*  HCT 25.1*  PLT 293  APTT --  LABPROT 26.2*  INR 2.36*  HEPARINUNFRC --  CREATININE 2.69*  CKTOTAL --  CKMB --  TROPONINI --   Estimated Creatinine Clearance: 46.1 ml/min (by C-G formula based on Cr of 2.69).  Medical History: Past Medical History  Diagnosis Date  . Cancer   . Diabetes mellitus   . DVT (deep venous thrombosis)   . Diabetic nephropathy   . Diabetic retinopathy   . Hypertension   . Hyperlipidemia   . Non Hodgkin's lymphoma     Tx 2009  . History of cardiac catheterization     Medications:  Scheduled:    . amLODipine-benazepril  1 capsule Oral Daily  . azithromycin  500 mg Intravenous Q24H  . azithromycin  500 mg Oral Once  . benzonatate  200 mg Oral Once  . cefTRIAXone (ROCEPHIN)  IV  1 g Intravenous Once  . cefTRIAXone (ROCEPHIN)  IV  1 g Intravenous Q24H  . furosemide  40 mg Intravenous Once  . furosemide  40 mg Intravenous Once  . furosemide  80 mg Intravenous Q6H  . insulin aspart  0-20 Units Subcutaneous TID WC  . insulin aspart  0-5 Units Subcutaneous QHS  . insulin glargine  40 Units Subcutaneous QHS  . polysaccharide iron  150 mg Oral Daily  . potassium chloride SA  20 mEq Oral Daily  . sodium chloride  3 mL Intravenous Q12H  . warfarin  5 mg Oral Daily  . DISCONTD: furosemide  40 mg Intramuscular Once    Assessment: 60 yo male admitted with progressive SOB to be continued on his warfarin for hx DVT. Patient was taking warfarin 7.5mg  daily except 10mg  on MWF  Goal of Therapy:  INR 2-3   Plan:  INR therapeutic. Per med  reconciliation, warfarin was already taken today per patient. No more warfarin today. Will re-evaulate INR tomorrow AM  Adrian Saran, PharmD, BCPS 05/20/2011 7:20 PM [phone]

## 2011-05-20 NOTE — Progress Notes (Addendum)
PT. QTC IS BETWEEN .48 AND .52, PT. IS ASYPTOMATIC, I HAVE NOTIFIED DR. AVVA, NO NEW ORDERS AT THIS TIME, WILL CONTINUE TO MONITOR AND OBSERVE PT.

## 2011-05-21 ENCOUNTER — Other Ambulatory Visit: Payer: Self-pay

## 2011-05-21 DIAGNOSIS — R0602 Shortness of breath: Secondary | ICD-10-CM

## 2011-05-21 DIAGNOSIS — M7989 Other specified soft tissue disorders: Secondary | ICD-10-CM

## 2011-05-21 LAB — COMPREHENSIVE METABOLIC PANEL
ALT: 14 U/L (ref 0–53)
AST: 20 U/L (ref 0–37)
Albumin: 2.5 g/dL — ABNORMAL LOW (ref 3.5–5.2)
Alkaline Phosphatase: 118 U/L — ABNORMAL HIGH (ref 39–117)
BUN: 33 mg/dL — ABNORMAL HIGH (ref 6–23)
CO2: 25 mEq/L (ref 19–32)
Calcium: 8.9 mg/dL (ref 8.4–10.5)
Chloride: 105 mEq/L (ref 96–112)
Creatinine, Ser: 2.71 mg/dL — ABNORMAL HIGH (ref 0.50–1.35)
GFR calc Af Amer: 28 mL/min — ABNORMAL LOW (ref >90)
GFR calc non Af Amer: 24 mL/min — ABNORMAL LOW (ref >90)
Glucose, Bld: 306 mg/dL — ABNORMAL HIGH (ref 70–99)
Potassium: 3.9 mEq/L (ref 3.5–5.1)
Sodium: 140 mEq/L (ref 135–145)
Total Bilirubin: 0.3 mg/dL (ref 0.3–1.2)
Total Protein: 6.4 g/dL (ref 6.0–8.3)

## 2011-05-21 LAB — BASIC METABOLIC PANEL
BUN: 32 mg/dL — ABNORMAL HIGH (ref 6–23)
CO2: 25 mEq/L (ref 19–32)
Calcium: 9.1 mg/dL (ref 8.4–10.5)
Chloride: 104 mEq/L (ref 96–112)
Creatinine, Ser: 2.86 mg/dL — ABNORMAL HIGH (ref 0.50–1.35)
GFR calc Af Amer: 26 mL/min — ABNORMAL LOW (ref >90)
GFR calc non Af Amer: 23 mL/min — ABNORMAL LOW (ref >90)
Glucose, Bld: 243 mg/dL — ABNORMAL HIGH (ref 70–99)
Potassium: 3.6 mEq/L (ref 3.5–5.1)
Sodium: 139 mEq/L (ref 135–145)

## 2011-05-21 LAB — FERRITIN: Ferritin: 81 ng/mL (ref 22–322)

## 2011-05-21 LAB — GLUCOSE, CAPILLARY
Glucose-Capillary: 177 mg/dL — ABNORMAL HIGH (ref 70–99)
Glucose-Capillary: 140 mg/dL — ABNORMAL HIGH (ref 70–99)
Glucose-Capillary: 199 mg/dL — ABNORMAL HIGH (ref 70–99)
Glucose-Capillary: 193 mg/dL — ABNORMAL HIGH (ref 70–99)

## 2011-05-21 LAB — IRON AND TIBC
Iron: 25 ug/dL — ABNORMAL LOW (ref 42–135)
Saturation Ratios: 10 % — ABNORMAL LOW (ref 20–55)
TIBC: 261 ug/dL (ref 215–435)
UIBC: 236 ug/dL (ref 125–400)

## 2011-05-21 LAB — MAGNESIUM: Magnesium: 2.1 mg/dL (ref 1.5–2.5)

## 2011-05-21 LAB — PROTIME-INR
INR: 2.52 — ABNORMAL HIGH (ref 0.00–1.49)
Prothrombin Time: 27.6 seconds — ABNORMAL HIGH (ref 11.6–15.2)

## 2011-05-21 LAB — VITAMIN B12: Vitamin B-12: 543 pg/mL (ref 211–911)

## 2011-05-21 MED ORDER — WARFARIN SODIUM 7.5 MG PO TABS
7.5000 mg | ORAL_TABLET | Freq: Once | ORAL | Status: AC
  Administered 2011-05-21: 7.5 mg via ORAL
  Filled 2011-05-21: qty 1

## 2011-05-21 MED ORDER — DARBEPOETIN ALFA-POLYSORBATE 60 MCG/0.3ML IJ SOLN
60.0000 ug | INTRAMUSCULAR | Status: DC
  Administered 2011-05-21: 60 ug via SUBCUTANEOUS
  Filled 2011-05-21: qty 0.3

## 2011-05-21 MED ORDER — GUAIFENESIN-DM 100-10 MG/5ML PO SYRP
15.0000 mL | ORAL_SOLUTION | ORAL | Status: DC | PRN
  Administered 2011-05-21 – 2011-05-23 (×3): 15 mL via ORAL
  Filled 2011-05-21 (×3): qty 20

## 2011-05-21 MED ORDER — FUROSEMIDE 10 MG/ML IJ SOLN
80.0000 mg | Freq: Two times a day (BID) | INTRAMUSCULAR | Status: DC
  Administered 2011-05-21 – 2011-05-23 (×4): 80 mg via INTRAVENOUS
  Filled 2011-05-21 (×6): qty 8

## 2011-05-21 MED ORDER — FERUMOXYTOL INJECTION 510 MG/17 ML
510.0000 mg | INTRAVENOUS | Status: AC
  Administered 2011-05-21 – 2011-05-24 (×2): 510 mg via INTRAVENOUS
  Filled 2011-05-21 (×3): qty 17

## 2011-05-21 MED ORDER — CARVEDILOL 3.125 MG PO TABS
3.1250 mg | ORAL_TABLET | Freq: Two times a day (BID) | ORAL | Status: DC
  Administered 2011-05-21 – 2011-05-22 (×3): 3.125 mg via ORAL
  Filled 2011-05-21 (×5): qty 1

## 2011-05-21 NOTE — Progress Notes (Signed)
Bilateral lower extremity venous duplex completed.  Preliminary report is no obvious evidence of DVT or SVT in the vessels visualized.  Negative for a Baker's cyst bilaterally. Charlaine Dalton 05/21/2011, 9:05 AM

## 2011-05-21 NOTE — Progress Notes (Signed)
  Echocardiogram 2D Echocardiogram has been performed.  Ramos, RDCS 05/21/2011, 12:09 PM

## 2011-05-21 NOTE — Progress Notes (Signed)
Subjective: [name] has had [name] good night by his report. His breathing is settled down nicely. He has no chest pain. He has no shortness of breath nausea vomiting. Quite comfortable on the oxygen but doesn't knowledgeably breathing is not back to baseline. He's had nominal coughing.  Objective: Vital signs in last 24 hours: Temp:  [97.5 F (36.4 C)-98.4 F (36.9 C)] 97.5 F (36.4 C) (12/20 0545) Pulse Rate:  [92-103] 92  (12/20 0545) Resp:  [20-22] 20  (12/20 0545) BP: (147-178)/(67-105) 147/79 mmHg (12/20 0545) SpO2:  [80 %-100 %] 91 % (12/20 0545) FiO2 (%):  [4 %] 4 % (12/19 1556) Weight:  [145.151 kg (320 lb)-155.1 kg (341 lb 14.9 oz)] 341 lb 14.9 oz (155.1 kg) (12/20 0500) Weight change:   CBG (last 3)   Basename 05/20/11 2045  GLUCAP 287*    Intake/Output from previous day: 12/19 0701 - 12/20 0700 In: 720 [P.O.:720] Out: 2400 [Urine:2400]  Physical Exam: Patient is awake alert no distress. He is wearing [name] 2 L nasal cannula conversant bright. No JVD or bruits. Lungs revealed diminished breath sounds at the bases with some rales the left base. Cardiovascular exam heart sounds are distant regular obvious murmur. Abdomen is soft nontender bowel sounds normal no hepatosplenomegaly. Extremities do reveal 2+ edema improved venous stasis changes intact distal pulses. Neurologically he is intact.   Lab Results:  [facility] 05/20/11 2045 05/20/11 1240  NA 140 137  K 3.9 3.6  CL 105 103  CO2 25 26  GLUCOSE 306* 327*  BUN 33* 31*  CREATININE 2.71* 2.69*  CALCIUM 8.9 8.8  MG 2.1 --  PHOS -- --    [facility] 05/20/11 2045 05/20/11 1240  AST 20 17  ALT 14 16  ALKPHOS 118* 122*  BILITOT 0.3 0.3  PROT 6.4 6.5  ALBUMIN 2.5* 2.5*    Basename 05/20/11 2045 05/20/11 1240  WBC 5.7 5.7  NEUTROABS 4.0 4.2  HGB 7.9* 7.9*  HCT 24.9* 25.1*  MCV 80.1 80.2  PLT 295 293   Lab Results  Component Value Date   INR 2.36* 05/20/2011   INR 2.20* 01/16/2011   INR 2.12* 01/15/2011   No  results found for this basename: CKTOTAL:3,CKMB:3,CKMBINDEX:3,TROPONINI:3 in the last 72 hours No results found for this basename: TSH,T4TOTAL,FREET3,T3FREE,THYROIDAB in the last 72 hours No results found for this basename: VITAMINB12:2,FOLATE:2,FERRITIN:2,TIBC:2,IRON:2,RETICCTPCT:2 in the last 72 hours  Studies/Results: US Renal Port  05/20/2011  *RADIOLOGY REPORT*  Clinical Data: Renal failure.  Lymphoma.  Diabetes and hypertension  RENAL/URINARY TRACT ULTRASOUND COMPLETE  Comparison:  Ultrasound 01/13/2011  Findings:  Right Kidney:  11.7 cm.  Negative  Left Kidney:  12.6 cm.  Negative  Bladder:  Empty  IMPRESSION: Negative renal ultrasound.  Original Report Authenticated By: Truett Perna, [name]   Dg Chest Port 1v Same Day  05/20/2011  *RADIOLOGY REPORT*  Clinical Data: Cough, shortness of breath, history of lymphoma  PORTABLE CHEST - 1 VIEW SAME DAY  Comparison: 01/13/2011  Findings: Borderline cardiomegaly noted.  Bilateral central airspace disease noted with asymmetric appearance more prominent in the left upper lobe perihilar and right lower lobe infrahilar. This are suspicious for bilateral pneumonia rather than asymmetric pulmonary edema.  Clinical correlation is necessary.  IMPRESSION:  Bilateral central airspace disease noted with asymmetric appearance more prominent in the left upper lobe perihilar and right lower lobe infrahilar.  This are suspicious for bilateral pneumonia rather than asymmetric pulmonary edema.  Clinical correlation is necessary.Follow-up to resolution is recommended.  Original Report  Authenticated By: Lahoma Crocker, [name]     Assessment/Plan: #1 pulmonary infiltrates mixed pneumonia/CHF on both IV Lasix as well as Rocephin/Zithromax day 2. Exam has improved and symptoms have improved. He did have [name] bump in his BUN and creatinine in Lasix to be dropped back down to every 12 hours. Renal ultrasound is noted. Await echocardiogram to assess LV function.  #2 peripheral edema  believe an element of this is venous but also most questions CHF as noted above.  #3 thromboembolic disease on Coumadin per pharmacy  #4 diabetes mellitus type 2 with nephropathy, retinopathy and neuropathy control again notoriously poor because of finances and excess ability to insulin despite multiple interventions  #5 essential hypertension stable we will consult renal but must consider discontinuation of the ACE inhibitor with [name] rising BUN and creatinine should he not improve utilizing perhaps hydralazine. Also await echocardiogram to assess LV function.  #5 anemia stool is negative will check iron studies this may be related to chronic renal disease we'll consult pharmacy for air and aspirin and check iron studies  #6 non-Hodgkin's lymphoma no evidence of disease   LOS: 1 day   [name] 05/21/2011, 6:53 AM

## 2011-05-21 NOTE — Progress Notes (Signed)
Inpatient Diabetes Program Recommendations  AACE/ADA: New Consensus Statement on Inpatient Glycemic Control (2009)  Target Ranges:  Prepandial:   less than 140 mg/dL      Peak postprandial:   less than 180 mg/dL (1-2 hours)      Critically ill patients:  140 - 180 mg/dL   Reason for Visit: Elevated glucose:  287, 243 mg/dL  Inpatient Diabetes Program Recommendations Insulin - Meal Coverage: Consider adding meal coverage:  Begin with Novolog 6 units TID Diet: Add CHO modified medium

## 2011-05-21 NOTE — Progress Notes (Signed)
ANTICOAGULATION CONSULT NOTE - Follow Up  Pharmacy Consult for warfarin Indication: hx DVT  No Known Allergies  Patient Measurements: Height: 6\' 3"  (190.5 cm) Weight: 341 lb 14.9 oz (155.1 kg) (standing scale B) IBW/kg (Calculated) : 84.5   Vital Signs: Temp: 97.5 F (36.4 C) (12/20 0545) Temp src: Oral (12/20 0545) BP: 147/79 mmHg (12/20 0545) Pulse Rate: 92  (12/20 0545)  Labs:  Basename 05/21/11 0448 05/20/11 2045 05/20/11 1240  HGB -- 7.9* 7.9*  HCT -- 24.9* 25.1*  PLT -- 295 293  APTT -- 84* --  LABPROT 27.6* -- 26.2*  INR 2.52* -- 2.36*  HEPARINUNFRC -- -- --  CREATININE 2.86* 2.71* 2.69*  CKTOTAL -- -- --  CKMB -- -- --  TROPONINI -- -- --   Estimated Creatinine Clearance: 44.9 ml/min (by C-G formula based on Cr of 2.86).  Medical History: Past Medical History  Diagnosis Date  . Cancer   . Diabetes mellitus   . DVT (deep venous thrombosis)   . Diabetic nephropathy   . Diabetic retinopathy   . Hypertension   . Hyperlipidemia   . Non Hodgkin's lymphoma     Tx 2009  . History of cardiac catheterization     Medications:  Scheduled:     . amLODipine  5 mg Oral Daily  . azithromycin  500 mg Intravenous Q24H  . azithromycin  500 mg Oral Once  . benazepril  20 mg Oral Daily  . benzonatate  200 mg Oral Once  . cefTRIAXone (ROCEPHIN)  IV  1 g Intravenous Once  . cefTRIAXone (ROCEPHIN)  IV  1 g Intravenous Q24H  . darbepoetin (ARANESP) injection - NON-DIALYSIS  60 mcg Subcutaneous Q Thu-1800  . furosemide  40 mg Intravenous Once  . furosemide  40 mg Intravenous Once  . furosemide  80 mg Intravenous Q12H  . insulin aspart  0-20 Units Subcutaneous TID WC  . insulin aspart  0-5 Units Subcutaneous QHS  . insulin glargine  40 Units Subcutaneous QHS  . polysaccharide iron  150 mg Oral Daily  . potassium chloride SA  20 mEq Oral Daily  . sodium chloride  3 mL Intravenous Q12H  . DISCONTD: amLODipine-benazepril  1 capsule Oral Daily  . DISCONTD: furosemide   40 mg Intramuscular Once  . DISCONTD: furosemide  80 mg Intravenous Q6H  . DISCONTD: warfarin  5 mg Oral Daily    Assessment: 60 yo male admitted with progressive SOB to be continued on his warfarin for hx DVT.  Patient's INR appears therapeutic on home regimen, so will continue with home dose. Patient was taking warfarin 7.5mg  daily except 10mg  on MWF. No bleeding/complications reported. Patient was started on azithromycin last night, possible interaction with warfarin noted.  Goal of Therapy:  INR 2-3   Plan:  Warfarin 7.5mg  po once today. Follow up INR in AM.  Hershal Coria, PharmD Pager: [phone] 05/21/2011 11:30 AM

## 2011-05-21 NOTE — Progress Notes (Signed)
MEDICATION RELATED CONSULT NOTE - INITIAL   Pharmacy Consult for Aranesp Indication: anemia 2/2 CRI  No Known Allergies  Patient Measurements: Height: 6\' 3"  (190.5 cm) Weight: 341 lb 14.9 oz (155.1 kg) (standing scale B) IBW/kg (Calculated) : 84.5  Adjusted Body Weight: 112.7 kg  Vital Signs: Temp: 97.5 F (36.4 C) (12/20 0545) Temp src: Oral (12/20 0545) BP: 147/79 mmHg (12/20 0545) Pulse Rate: 92  (12/20 0545) Intake/Output from previous day: 12/19 0701 - 12/20 0700 In: 720 [P.O.:720] Out: 2400 [Urine:2400] Intake/Output from this shift:    Labs:  [facility] 05/21/11 0448 05/20/11 2045 05/20/11 1240  WBC -- 5.7 5.7  HGB -- 7.9* 7.9*  HCT -- 24.9* 25.1*  PLT -- 295 293  APTT -- 84* --  CREATININE PENDING 2.71* 2.69*  LABCREA -- -- --  CREATININE PENDING 2.71* 2.69*  CREAT24HRUR -- -- --  MG -- 2.1 --  PHOS -- -- --  ALBUMIN -- 2.5* 2.5*  PROT -- 6.4 6.5  ALBUMIN -- 2.5* 2.5*  AST -- 20 17  ALT -- 14 16  ALKPHOS -- 118* 122*  BILITOT -- 0.3 0.3  BILIDIR -- -- --  IBILI -- -- --   CrCl cannot be calculated (Patient has no sCr result on file.).   Microbiology: No results found for this or any previous visit (from the past 720 hour(s)).  Medical History: Past Medical History  Diagnosis Date  . Cancer   . Diabetes mellitus   . DVT (deep venous thrombosis)   . Diabetic nephropathy   . Diabetic retinopathy   . Hypertension   . Hyperlipidemia   . Non Hodgkin's lymphoma     Tx 2009  . History of cardiac catheterization     Medications:  Scheduled:    . amLODipine  5 mg Oral Daily  . azithromycin  500 mg Intravenous Q24H  . azithromycin  500 mg Oral Once  . benazepril  20 mg Oral Daily  . benzonatate  200 mg Oral Once  . cefTRIAXone (ROCEPHIN)  IV  1 g Intravenous Once  . cefTRIAXone (ROCEPHIN)  IV  1 g Intravenous Q24H  . furosemide  40 mg Intravenous Once  . furosemide  40 mg Intravenous Once  . furosemide  80 mg Intravenous Q12H  . insulin  aspart  0-20 Units Subcutaneous TID WC  . insulin aspart  0-5 Units Subcutaneous QHS  . insulin glargine  40 Units Subcutaneous QHS  . polysaccharide iron  150 mg Oral Daily  . potassium chloride SA  20 mEq Oral Daily  . sodium chloride  3 mL Intravenous Q12H  . DISCONTD: amLODipine-benazepril  1 capsule Oral Daily  . DISCONTD: furosemide  40 mg Intramuscular Once  . DISCONTD: furosemide  80 mg Intravenous Q6H  . DISCONTD: warfarin  5 mg Oral Daily    Assessment: 60YOM with anemia most likely related to chronic renal disease. Pharmacy consulted to dose Aranesp. Hgb 7.9. No bleeding reported. Iron studies pending. Patient is not receiving any iron supplementation currently.   Goal of Therapy:  Avoidance of transfusion; will hold for Hgb >10  Plan:  Aranesp 60 mcg SQ weekly for up to 4 doses. Follow up results of iron studies and initiation of iron supplementation.  [name] 05/21/2011,7:21 AM

## 2011-05-21 NOTE — Consult Note (Addendum)
Reason for Consult: Acute Renal failure on Chronic Kidney Disease Stage 3 Referring Physician: Burnard Bunting MD  [name] is an 60 y.o. male.  HPI: 60 yo AAM with a nearly 25 year history of diabetes complicated by retinopathy, neuropathy and chronic kidney disease (Baseline creatinine around 2-2.3). Sub-optimally compliant due to financial barriers. Presented to the ER yesterday with a weeks history of progressively worsening shortness of breath, orthopnea and pedal edema along with limited exertional tolerance. On admission noted to have an elevated creatinine of 2.7. Denies prior history of AKI, hematuria or kidney stones. Denies recent vomiting, diarrhea or anorexia- had transient nausea for a day that resolved. Denies rash, sinusitis type symptoms or hemoptysis. Apparently compliant with his ACE-I and was taking 2 aleve tablets daily for a week (headaches). No iv contrast exposure and no preceding antibiotic use.  Currently reports an improvement in respiratory status after overnight diuresis.   Past Medical History  Diagnosis Date  . Cancer   . Diabetes mellitus   . DVT (deep venous thrombosis)   . Diabetic nephropathy   . Diabetic retinopathy   . Hypertension   . Hyperlipidemia   . Non Hodgkin's lymphoma     Tx 2009  . History of cardiac catheterization     History reviewed. No pertinent past surgical history.  History reviewed. No pertinent family history.  Social History:  reports that he has never smoked. He has never used smokeless tobacco. He reports that he does not drink alcohol or use illicit drugs.  Allergies: No Known Allergies  Medications:    . amLODipine  5 mg Oral Daily  . azithromycin  500 mg Intravenous Q24H  . azithromycin  500 mg Oral Once  . benazepril  20 mg Oral Daily  . benzonatate  200 mg Oral Once  . cefTRIAXone (ROCEPHIN)  IV  1 g Intravenous Once  . cefTRIAXone (ROCEPHIN)  IV  1 g Intravenous Q24H  . darbepoetin (ARANESP) injection -  NON-DIALYSIS  60 mcg Subcutaneous Q Thu-1800  . furosemide  40 mg Intravenous Once  . furosemide  40 mg Intravenous Once  . furosemide  80 mg Intravenous Q12H  . insulin aspart  0-20 Units Subcutaneous TID WC  . insulin aspart  0-5 Units Subcutaneous QHS  . insulin glargine  40 Units Subcutaneous QHS  . polysaccharide iron  150 mg Oral Daily  . potassium chloride SA  20 mEq Oral Daily  . sodium chloride  3 mL Intravenous Q12H  . warfarin  7.5 mg Oral ONCE-1800  . DISCONTD: amLODipine-benazepril  1 capsule Oral Daily  . DISCONTD: furosemide  40 mg Intramuscular Once  . DISCONTD: furosemide  80 mg Intravenous Q6H  . DISCONTD: warfarin  5 mg Oral Daily     Results for orders placed during the hospital encounter of 05/20/11 (from the past 48 hour(s))  CBC     Status: Abnormal   Collection Time   05/20/11 12:40 PM      Component Value Range Comment   WBC 5.7  4.0 - 10.5 (K/uL)    RBC 3.13 (*) 4.22 - 5.81 (MIL/uL)    Hemoglobin 7.9 (*) 13.0 - 17.0 (g/dL)    HCT 25.1 (*) 39.0 - 52.0 (%)    MCV 80.2  78.0 - 100.0 (fL)    MCH 25.2 (*) 26.0 - 34.0 (pg)    MCHC 31.5  30.0 - 36.0 (g/dL)    RDW 17.1 (*) 11.5 - 15.5 (%)    Platelets 293  150 - 400 (K/uL)   DIFFERENTIAL     Status: Normal   Collection Time   05/20/11 12:40 PM      Component Value Range Comment   Neutrophils Relative 75  43 - 77 (%)    Neutro Abs 4.2  1.7 - 7.7 (K/uL)    Lymphocytes Relative 17  12 - 46 (%)    Lymphs Abs 1.0  0.7 - 4.0 (K/uL)    Monocytes Relative 7  3 - 12 (%)    Monocytes Absolute 0.4  0.1 - 1.0 (K/uL)    Eosinophils Relative 1  0 - 5 (%)    Eosinophils Absolute 0.1  0.0 - 0.7 (K/uL)    Basophils Relative 0  0 - 1 (%)    Basophils Absolute 0.0  0.0 - 0.1 (K/uL)   COMPREHENSIVE METABOLIC PANEL     Status: Abnormal   Collection Time   05/20/11 12:40 PM      Component Value Range Comment   Sodium 137  135 - 145 (mEq/L)    Potassium 3.6  3.5 - 5.1 (mEq/L)    Chloride 103  96 - 112 (mEq/L)    CO2 26   19 - 32 (mEq/L)    Glucose, Bld 327 (*) 70 - 99 (mg/dL)    BUN 31 (*) 6 - 23 (mg/dL)    Creatinine, Ser 2.69 (*) 0.50 - 1.35 (mg/dL)    Calcium 8.8  8.4 - 10.5 (mg/dL)    Total Protein 6.5  6.0 - 8.3 (g/dL)    Albumin 2.5 (*) 3.5 - 5.2 (g/dL)    AST 17  0 - 37 (U/L)    ALT 16  0 - 53 (U/L)    Alkaline Phosphatase 122 (*) 39 - 117 (U/L)    Total Bilirubin 0.3  0.3 - 1.2 (mg/dL)    GFR calc non Af Amer 24 (*) >90 (mL/min)    GFR calc Af Amer 28 (*) >90 (mL/min)   PROTIME-INR     Status: Abnormal   Collection Time   05/20/11 12:40 PM      Component Value Range Comment   Prothrombin Time 26.2 (*) 11.6 - 15.2 (seconds)    INR 2.36 (*) 0.00 - 1.49    PRO B NATRIURETIC PEPTIDE     Status: Abnormal   Collection Time   05/20/11  1:10 PM      Component Value Range Comment   Pro B Natriuretic peptide (BNP) 6513.0 (*) 0 - 125 (pg/mL)   TYPE AND SCREEN     Status: Normal   Collection Time   05/20/11  1:10 PM      Component Value Range Comment   ABO/RH(D) A POS      Antibody Screen NEG      Sample Expiration 05/23/2011     POCT I-STAT TROPONIN I     Status: Normal   Collection Time   05/20/11  1:30 PM      Component Value Range Comment   Troponin i, poc 0.06  0.00 - 0.08 (ng/mL)    Comment 3            OCCULT BLOOD, POC DEVICE     Status: Normal   Collection Time   05/20/11  1:59 PM      Component Value Range Comment   Fecal Occult Bld NEGATIVE     URINALYSIS, ROUTINE W REFLEX MICROSCOPIC     Status: Abnormal   Collection Time   05/20/11  2:57 PM  Component Value Range Comment   Color, Urine YELLOW  YELLOW     APPearance CLEAR  CLEAR     Specific Gravity, Urine 1.022  1.005 - 1.030     pH 7.5  5.0 - 8.0     Glucose, UA >1000 (*) NEGATIVE (mg/dL)    Hgb urine dipstick MODERATE (*) NEGATIVE     Bilirubin Urine NEGATIVE  NEGATIVE     Ketones, ur NEGATIVE  NEGATIVE (mg/dL)    Protein, ur >300 (*) NEGATIVE (mg/dL)    Urobilinogen, UA 0.2  0.0 - 1.0 (mg/dL)    Nitrite  NEGATIVE  NEGATIVE     Leukocytes, UA NEGATIVE  NEGATIVE    URINE MICROSCOPIC-ADD ON     Status: Normal   Collection Time   05/20/11  2:57 PM      Component Value Range Comment   WBC, UA 0-2  <3 (WBC/hpf)    RBC / HPF 7-10  <3 (RBC/hpf)    Bacteria, UA RARE  RARE     Urine-Other MUCOUS PRESENT     CBC     Status: Abnormal   Collection Time   05/20/11  8:45 PM      Component Value Range Comment   WBC 5.7  4.0 - 10.5 (K/uL)    RBC 3.11 (*) 4.22 - 5.81 (MIL/uL)    Hemoglobin 7.9 (*) 13.0 - 17.0 (g/dL)    HCT 24.9 (*) 39.0 - 52.0 (%)    MCV 80.1  78.0 - 100.0 (fL)    MCH 25.4 (*) 26.0 - 34.0 (pg)    MCHC 31.7  30.0 - 36.0 (g/dL)    RDW 17.0 (*) 11.5 - 15.5 (%)    Platelets 295  150 - 400 (K/uL)   DIFFERENTIAL     Status: Normal   Collection Time   05/20/11  8:45 PM      Component Value Range Comment   Neutrophils Relative 70  43 - 77 (%)    Neutro Abs 4.0  1.7 - 7.7 (K/uL)    Lymphocytes Relative 21  12 - 46 (%)    Lymphs Abs 1.2  0.7 - 4.0 (K/uL)    Monocytes Relative 7  3 - 12 (%)    Monocytes Absolute 0.4  0.1 - 1.0 (K/uL)    Eosinophils Relative 1  0 - 5 (%)    Eosinophils Absolute 0.1  0.0 - 0.7 (K/uL)    Basophils Relative 0  0 - 1 (%)    Basophils Absolute 0.0  0.0 - 0.1 (K/uL)   COMPREHENSIVE METABOLIC PANEL     Status: Abnormal   Collection Time   05/20/11  8:45 PM      Component Value Range Comment   Sodium 140  135 - 145 (mEq/L)    Potassium 3.9  3.5 - 5.1 (mEq/L)    Chloride 105  96 - 112 (mEq/L)    CO2 25  19 - 32 (mEq/L)    Glucose, Bld 306 (*) 70 - 99 (mg/dL)    BUN 33 (*) 6 - 23 (mg/dL)    Creatinine, Ser 2.71 (*) 0.50 - 1.35 (mg/dL)    Calcium 8.9  8.4 - 10.5 (mg/dL)    Total Protein 6.4  6.0 - 8.3 (g/dL)    Albumin 2.5 (*) 3.5 - 5.2 (g/dL)    AST 20  0 - 37 (U/L)    ALT 14  0 - 53 (U/L)    Alkaline Phosphatase 118 (*) 39 - 117 (U/L)  Total Bilirubin 0.3  0.3 - 1.2 (mg/dL)    GFR calc non Af Amer 24 (*) >90 (mL/min)    GFR calc Af Amer 28 (*) >90  (mL/min)   APTT     Status: Abnormal   Collection Time   05/20/11  8:45 PM      Component Value Range Comment   aPTT 84 (*) 24 - 37 (seconds)   MAGNESIUM     Status: Normal   Collection Time   05/20/11  8:45 PM      Component Value Range Comment   Magnesium 2.1  1.5 - 2.5 (mg/dL)   GLUCOSE, CAPILLARY     Status: Abnormal   Collection Time   05/20/11  8:45 PM      Component Value Range Comment   Glucose-Capillary 287 (*) 70 - 99 (mg/dL)    Comment 1 Notify RN      Comment 2 Documented in Chart     BASIC METABOLIC PANEL     Status: Abnormal   Collection Time   05/21/11  4:48 AM      Component Value Range Comment   Sodium 139  135 - 145 (mEq/L)    Potassium 3.6  3.5 - 5.1 (mEq/L)    Chloride 104  96 - 112 (mEq/L)    CO2 25  19 - 32 (mEq/L)    Glucose, Bld 243 (*) 70 - 99 (mg/dL)    BUN 32 (*) 6 - 23 (mg/dL)    Creatinine, Ser 2.86 (*) 0.50 - 1.35 (mg/dL)    Calcium 9.1  8.4 - 10.5 (mg/dL)    GFR calc non Af Amer 23 (*) >90 (mL/min)    GFR calc Af Amer 26 (*) >90 (mL/min)   PROTIME-INR     Status: Abnormal   Collection Time   05/21/11  4:48 AM      Component Value Range Comment   Prothrombin Time 27.6 (*) 11.6 - 15.2 (seconds)    INR 2.52 (*) 0.00 - 1.49    GLUCOSE, CAPILLARY     Status: Abnormal   Collection Time   05/21/11  7:45 AM      Component Value Range Comment   Glucose-Capillary 177 (*) 70 - 99 (mg/dL)    Comment 1 Notify RN     FERRITIN     Status: Normal   Collection Time   05/21/11  8:20 AM      Component Value Range Comment   Ferritin 81  22 - 322 (ng/mL)   IRON AND TIBC     Status: Abnormal   Collection Time   05/21/11  8:20 AM      Component Value Range Comment   Iron 25 (*) 42 - 135 (ug/dL)    TIBC 261  215 - 435 (ug/dL)    Saturation Ratios 10 (*) 20 - 55 (%)    UIBC 236  125 - 400 (ug/dL)   VITAMIN B12     Status: Normal   Collection Time   05/21/11  8:20 AM      Component Value Range Comment   Vitamin B-12 543  211 - 911 (pg/mL)   GLUCOSE,  CAPILLARY     Status: Abnormal   Collection Time   05/21/11 11:40 AM      Component Value Range Comment   Glucose-Capillary 140 (*) 70 - 99 (mg/dL)     US Renal Port  05/20/2011  *RADIOLOGY REPORT*  Clinical Data: Renal failure.  Lymphoma.  Diabetes and hypertension  RENAL/URINARY TRACT ULTRASOUND COMPLETE  Comparison:  Ultrasound 01/13/2011  Findings:  Right Kidney:  11.7 cm.  Negative  Left Kidney:  12.6 cm.  Negative  Bladder:  Empty  IMPRESSION: Negative renal ultrasound.  Original Report Authenticated By: Truett Perna, [name]   Dg Chest Port 1v Same Day  05/20/2011  *RADIOLOGY REPORT*  Clinical Data: Cough, shortness of breath, history of lymphoma  PORTABLE CHEST - 1 VIEW SAME DAY  Comparison: 01/13/2011  Findings: Borderline cardiomegaly noted.  Bilateral central airspace disease noted with asymmetric appearance more prominent in the left upper lobe perihilar and right lower lobe infrahilar. This are suspicious for bilateral pneumonia rather than asymmetric pulmonary edema.  Clinical correlation is necessary.  IMPRESSION:  Bilateral central airspace disease noted with asymmetric appearance more prominent in the left upper lobe perihilar and right lower lobe infrahilar.  This are suspicious for bilateral pneumonia rather than asymmetric pulmonary edema.  Clinical correlation is necessary.Follow-up to resolution is recommended.  Original Report Authenticated By: Lahoma Crocker, [name]    Review of Systems  Constitutional: Positive for malaise/fatigue. Negative for fever, chills, weight loss and diaphoresis.  HENT: Positive for ear pain, congestion and ear discharge. Negative for hearing loss, nosebleeds, sore throat, neck pain and tinnitus.   Eyes: Positive for blurred vision. Negative for double vision, photophobia, pain, discharge and redness.  Respiratory: Positive for cough, shortness of breath and wheezing. Negative for hemoptysis, sputum production and stridor.   Cardiovascular: Positive for  chest pain, orthopnea and leg swelling. Negative for palpitations, claudication and PND.  Gastrointestinal: Positive for nausea. Negative for heartburn, vomiting, abdominal pain, diarrhea, constipation, blood in stool and melena.  Genitourinary: Negative for dysuria, urgency, frequency, hematuria and flank pain.  Musculoskeletal: Positive for back pain and joint pain. Negative for myalgias and falls.  Skin: Negative for itching and rash.  Neurological: Positive for weakness and headaches. Negative for dizziness, tingling, tremors, sensory change, speech change, focal weakness, seizures and loss of consciousness.  Endo/Heme/Allergies: Negative for environmental allergies and polydipsia. Does not bruise/bleed easily.  Psychiatric/Behavioral: Negative for depression, suicidal ideas, hallucinations and memory loss. The patient is not nervous/anxious and does not have insomnia.   All other systems reviewed and are negative.   Blood pressure 147/79, pulse 92, temperature 97.5 F (36.4 C), temperature source Oral, resp. rate 20, height 6\' 3"  (1.905 m), weight 155.1 kg (341 lb 14.9 oz), SpO2 91.00%. Physical Exam  Nursing note and vitals reviewed. Constitutional: He is oriented to person, place, and time. He appears well-developed and well-nourished. No distress.  HENT:  Head: Normocephalic and atraumatic.  Right Ear: External ear normal.  Left Ear: External ear normal.  Nose: Nose normal.  Mouth/Throat: Oropharynx is clear and moist. No oropharyngeal exudate.  Eyes: Conjunctivae and EOM are normal. Pupils are equal, round, and reactive to light. Right eye exhibits no discharge. Left eye exhibits no discharge. No scleral icterus.  Neck: Normal range of motion. Neck supple. No JVD present. No tracheal deviation present. No thyromegaly present.  Cardiovascular: Normal rate, regular rhythm and normal heart sounds.  Exam reveals no gallop and no friction rub.   No murmur heard. Respiratory: Effort  normal. No stridor. No respiratory distress. He has no wheezes. He has rales.   He exhibits no tenderness.  GI: Soft. Bowel sounds are normal. He exhibits distension. He exhibits no mass. There is no tenderness. There is no rebound and no guarding.  Musculoskeletal: Normal range of motion. He exhibits edema. He exhibits no tenderness.  Lymphadenopathy:    He has no cervical adenopathy.  Neurological: He is alert and oriented to person, place, and time. No cranial nerve deficit. Coordination normal.  Skin: Skin is warm and dry. No rash noted. He is not diaphoretic. No erythema. No pallor.  Psychiatric: He has a normal mood and affect. His behavior is normal. Judgment and thought content normal.    Assessment/Plan: 1. ARF on CKD 3: suspect a background of diabetic vs hypertensive kidney disease. I agree with Dr.Aronson's assessment that this is likely a a pre-renal picture in the face of CHF vs RAS activation by increased diuresis in the face of ongoing ACE-inhibitor use. Will check urine electrolytes and P/C ratio for additional information on the mechanism. Renal ultrasound negative for obstruction or lymphomatous infiltration. Will hold ACE-I for now as we continue to diurese him and restart this when he gets to his euvolemic statusfavour continuing ACE inhibitor for nephro-protection and hypertension management until problems encountered with hyperkalemia or frequent ARF on CKD from volume disturbances. 2. CHF exacerbation VS CAP: on Diuretics and rocephin therapy- clinically improving. 2-D echo pending. 3. Anemia of CKD: Low iron stores noted (Tsat 10%- will give iron) and reassess ESA needs 4. 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 5. Hypertension: likely elevated pressure from volume excess, monitor with diuretics and titrate amlodipine VS add B-blocker.   John Pflaum K. 05/21/2011, 1:52 PM

## 2011-05-21 NOTE — Consults (Addendum)
Reason for Consult: dyspnea  Requesting Physician: Dr Reynaldo Minium  HPI: This is a 60 y.o. male with a past medical history significant for diabetes and hypertension. He had a heart catheterization in 2010 which revealed normal coronary arteries. Echocardiogram in August of 2012 showed an ejection fraction of 55-60% with moderate LVH. He has not seen a cardiologist as an outpatient. Unfortunately according to Dr. Jacquiline Doe notes he said questionable compliance in the past usually secondary to financial issues. 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 He is admitted now with one week of increasing dyspnea and orthopnea and lower extremity edema. He admits he's run out of his medications approximately a week ago. He also admitted to me that he does not follow a limited sodium diet.  PMHx:  Past Medical History  Diagnosis Date  . Cancer   . Diabetes mellitus   . DVT (deep venous thrombosis)   . Diabetic nephropathy   . Diabetic retinopathy   . Hypertension   . Hyperlipidemia   . Non Hodgkin's lymphoma     Tx 2009  . History of cardiac catheterization    History reviewed. No pertinent past surgical history.  FAMHx: History reviewed. No pertinent family history.  SOCHx:  reports that he has never smoked. He has never used smokeless tobacco. He reports that he does not drink alcohol or use illicit drugs. he is currently on disability from [employer]. 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 He lives with his brother. He is not compliant with a low sodium diet.  ALLERGIES: No Known Allergies  ROS: acute on chronic lower extremity edema. No history of sleep apnea evaluation but he admits to snoring.  HOME MEDICATIONS: Prescriptions prior to admission  Medication Sig Dispense Refill  . acetaminophen (TYLENOL) 500 MG tablet Take 1,000 mg by mouth every 6 (six) hours as needed. For headache       . amLODipine-benazepril (LOTREL) 5-20 MG per  capsule Take 1 capsule by mouth daily.        . calcitRIOL (ROCALTROL) 0.25 MCG capsule Take 0.25 mcg by mouth daily.        . insulin lispro (HUMALOG) 100 UNIT/ML injection Inject 10-75 Units into the skin 3 (three) times daily before meals. Inject 75 units every day in the morning and at lunch and inject 10 units at bedtime       . polysaccharide iron (NIFEREX) 150 MG CAPS capsule Take 1 capsule (150 mg total) by mouth daily.  30 each  2  . potassium chloride SA (K-DUR,KLOR-CON) 20 MEQ tablet Take 1 tablet (20 mEq total) by mouth daily.  30 tablet  1  . torsemide (DEMADEX) 10 MG tablet Take 5 tablets (50 mg total) by mouth daily.  150 tablet  1  . warfarin (COUMADIN) 5 MG tablet Take 5 mg by mouth daily. Take one and one-half (7.5mg ) tablet by mouth on Sundays, Tuesdays, Thursdays, and Saturdays, then take two tablets (10mg ) on Mondays, Wednesdays, and Fridays or take as directed by coumadin clinic.        HOSPITAL MEDICATIONS: I have reviewed the patient's current medications.  VITALS: Blood pressure 147/79, pulse 92, temperature 97.5 F (36.4 C), temperature source Oral, resp. rate 20, height 6\' 3"  (1.905 m), weight 155.1 kg (341 lb 14.9 oz), SpO2 91.00%.  PHYSICAL EXAM: General appearance: alert, cooperative, no distress and morbidly obese Neck: no adenopathy, no carotid bruit, no JVD, supple, symmetrical, trachea midline  and thyroid not enlarged, symmetric, no tenderness/mass/nodules Lungs: clear to auscultation bilaterally Heart: regular rate and rhythm, 2/6 systolic murmur LSB Abdomen: obese non tender Extremities: 2-3+ biltat lower extremity edema Skin: Skin color, texture, turgor normal. No rashes or lesions Neurologic: Grossly normal  LABS: Results for orders placed during the hospital encounter of 05/20/11 (from the past 48 hour(s))  CBC     Status: Abnormal   Collection Time   05/20/11 12:40 PM      Component Value Range Comment   WBC 5.7  4.0 - 10.5 (K/uL)    RBC 3.13  (*) 4.22 - 5.81 (MIL/uL)    Hemoglobin 7.9 (*) 13.0 - 17.0 (g/dL)    HCT 25.1 (*) 39.0 - 52.0 (%)    MCV 80.2  78.0 - 100.0 (fL)    MCH 25.2 (*) 26.0 - 34.0 (pg)    MCHC 31.5  30.0 - 36.0 (g/dL)    RDW 17.1 (*) 11.5 - 15.5 (%)    Platelets 293  150 - 400 (K/uL)   DIFFERENTIAL     Status: Normal   Collection Time   05/20/11 12:40 PM      Component Value Range Comment   Neutrophils Relative 75  43 - 77 (%)    Neutro Abs 4.2  1.7 - 7.7 (K/uL)    Lymphocytes Relative 17  12 - 46 (%)    Lymphs Abs 1.0  0.7 - 4.0 (K/uL)    Monocytes Relative 7  3 - 12 (%)    Monocytes Absolute 0.4  0.1 - 1.0 (K/uL)    Eosinophils Relative 1  0 - 5 (%)    Eosinophils Absolute 0.1  0.0 - 0.7 (K/uL)    Basophils Relative 0  0 - 1 (%)    Basophils Absolute 0.0  0.0 - 0.1 (K/uL)   COMPREHENSIVE METABOLIC PANEL     Status: Abnormal   Collection Time   05/20/11 12:40 PM      Component Value Range Comment   Sodium 137  135 - 145 (mEq/L)    Potassium 3.6  3.5 - 5.1 (mEq/L)    Chloride 103  96 - 112 (mEq/L)    CO2 26  19 - 32 (mEq/L)    Glucose, Bld 327 (*) 70 - 99 (mg/dL)    BUN 31 (*) 6 - 23 (mg/dL)    Creatinine, Ser 2.69 (*) 0.50 - 1.35 (mg/dL)    Calcium 8.8  8.4 - 10.5 (mg/dL)    Total Protein 6.5  6.0 - 8.3 (g/dL)    Albumin 2.5 (*) 3.5 - 5.2 (g/dL)    AST 17  0 - 37 (U/L)    ALT 16  0 - 53 (U/L)    Alkaline Phosphatase 122 (*) 39 - 117 (U/L)    Total Bilirubin 0.3  0.3 - 1.2 (mg/dL)    GFR calc non Af Amer 24 (*) >90 (mL/min)    GFR calc Af Amer 28 (*) >90 (mL/min)   PROTIME-INR     Status: Abnormal   Collection Time   05/20/11 12:40 PM      Component Value Range Comment   Prothrombin Time 26.2 (*) 11.6 - 15.2 (seconds)    INR 2.36 (*) 0.00 - 1.49    PRO B NATRIURETIC PEPTIDE     Status: Abnormal   Collection Time   05/20/11  1:10 PM      Component Value Range Comment   Pro B Natriuretic peptide (BNP) 6513.0 (*) 0 - 125 (pg/mL)  TYPE AND SCREEN     Status: Normal   Collection Time    05/20/11  1:10 PM      Component Value Range Comment   ABO/RH(D) A POS      Antibody Screen NEG      Sample Expiration 05/23/2011     POCT I-STAT TROPONIN I     Status: Normal   Collection Time   05/20/11  1:30 PM      Component Value Range Comment   Troponin i, poc 0.06  0.00 - 0.08 (ng/mL)    Comment 3            OCCULT BLOOD, POC DEVICE     Status: Normal   Collection Time   05/20/11  1:59 PM      Component Value Range Comment   Fecal Occult Bld NEGATIVE     URINALYSIS, ROUTINE W REFLEX MICROSCOPIC     Status: Abnormal   Collection Time   05/20/11  2:57 PM      Component Value Range Comment   Color, Urine YELLOW  YELLOW     APPearance CLEAR  CLEAR     Specific Gravity, Urine 1.022  1.005 - 1.030     pH 7.5  5.0 - 8.0     Glucose, UA >1000 (*) NEGATIVE (mg/dL)    Hgb urine dipstick MODERATE (*) NEGATIVE     Bilirubin Urine NEGATIVE  NEGATIVE     Ketones, ur NEGATIVE  NEGATIVE (mg/dL)    Protein, ur >300 (*) NEGATIVE (mg/dL)    Urobilinogen, UA 0.2  0.0 - 1.0 (mg/dL)    Nitrite NEGATIVE  NEGATIVE     Leukocytes, UA NEGATIVE  NEGATIVE    URINE MICROSCOPIC-ADD ON     Status: Normal   Collection Time   05/20/11  2:57 PM      Component Value Range Comment   WBC, UA 0-2  <3 (WBC/hpf)    RBC / HPF 7-10  <3 (RBC/hpf)    Bacteria, UA RARE  RARE     Urine-Other MUCOUS PRESENT     CBC     Status: Abnormal   Collection Time   05/20/11  8:45 PM      Component Value Range Comment   WBC 5.7  4.0 - 10.5 (K/uL)    RBC 3.11 (*) 4.22 - 5.81 (MIL/uL)    Hemoglobin 7.9 (*) 13.0 - 17.0 (g/dL)    HCT 24.9 (*) 39.0 - 52.0 (%)    MCV 80.1  78.0 - 100.0 (fL)    MCH 25.4 (*) 26.0 - 34.0 (pg)    MCHC 31.7  30.0 - 36.0 (g/dL)    RDW 17.0 (*) 11.5 - 15.5 (%)    Platelets 295  150 - 400 (K/uL)   DIFFERENTIAL     Status: Normal   Collection Time   05/20/11  8:45 PM      Component Value Range Comment   Neutrophils Relative 70  43 - 77 (%)    Neutro Abs 4.0  1.7 - 7.7 (K/uL)    Lymphocytes  Relative 21  12 - 46 (%)    Lymphs Abs 1.2  0.7 - 4.0 (K/uL)    Monocytes Relative 7  3 - 12 (%)    Monocytes Absolute 0.4  0.1 - 1.0 (K/uL)    Eosinophils Relative 1  0 - 5 (%)    Eosinophils Absolute 0.1  0.0 - 0.7 (K/uL)    Basophils Relative 0  0 - 1 (%)  Basophils Absolute 0.0  0.0 - 0.1 (K/uL)   COMPREHENSIVE METABOLIC PANEL     Status: Abnormal   Collection Time   05/20/11  8:45 PM      Component Value Range Comment   Sodium 140  135 - 145 (mEq/L)    Potassium 3.9  3.5 - 5.1 (mEq/L)    Chloride 105  96 - 112 (mEq/L)    CO2 25  19 - 32 (mEq/L)    Glucose, Bld 306 (*) 70 - 99 (mg/dL)    BUN 33 (*) 6 - 23 (mg/dL)    Creatinine, Ser 2.71 (*) 0.50 - 1.35 (mg/dL)    Calcium 8.9  8.4 - 10.5 (mg/dL)    Total Protein 6.4  6.0 - 8.3 (g/dL)    Albumin 2.5 (*) 3.5 - 5.2 (g/dL)    AST 20  0 - 37 (U/L)    ALT 14  0 - 53 (U/L)    Alkaline Phosphatase 118 (*) 39 - 117 (U/L)    Total Bilirubin 0.3  0.3 - 1.2 (mg/dL)    GFR calc non Af Amer 24 (*) >90 (mL/min)    GFR calc Af Amer 28 (*) >90 (mL/min)   APTT     Status: Abnormal   Collection Time   05/20/11  8:45 PM      Component Value Range Comment   aPTT 84 (*) 24 - 37 (seconds)   MAGNESIUM     Status: Normal   Collection Time   05/20/11  8:45 PM      Component Value Range Comment   Magnesium 2.1  1.5 - 2.5 (mg/dL)   GLUCOSE, CAPILLARY     Status: Abnormal   Collection Time   05/20/11  8:45 PM      Component Value Range Comment   Glucose-Capillary 287 (*) 70 - 99 (mg/dL)    Comment 1 Notify RN      Comment 2 Documented in Chart     BASIC METABOLIC PANEL     Status: Abnormal   Collection Time   05/21/11  4:48 AM      Component Value Range Comment   Sodium 139  135 - 145 (mEq/L)    Potassium 3.6  3.5 - 5.1 (mEq/L)    Chloride 104  96 - 112 (mEq/L)    CO2 25  19 - 32 (mEq/L)    Glucose, Bld 243 (*) 70 - 99 (mg/dL)    BUN 32 (*) 6 - 23 (mg/dL)    Creatinine, Ser 2.86 (*) 0.50 - 1.35 (mg/dL)    Calcium 9.1  8.4 - 10.5  (mg/dL)    GFR calc non Af Amer 23 (*) >90 (mL/min)    GFR calc Af Amer 26 (*) >90 (mL/min)   PROTIME-INR     Status: Abnormal   Collection Time   05/21/11  4:48 AM      Component Value Range Comment   Prothrombin Time 27.6 (*) 11.6 - 15.2 (seconds)    INR 2.52 (*) 0.00 - 1.49    GLUCOSE, CAPILLARY     Status: Abnormal   Collection Time   05/21/11  7:45 AM      Component Value Range Comment   Glucose-Capillary 177 (*) 70 - 99 (mg/dL)    Comment 1 Notify RN     FERRITIN     Status: Normal   Collection Time   05/21/11  8:20 AM      Component Value Range Comment   Ferritin 81  22 -  322 (ng/mL)   IRON AND TIBC     Status: Abnormal   Collection Time   05/21/11  8:20 AM      Component Value Range Comment   Iron 25 (*) 42 - 135 (ug/dL)    TIBC 261  215 - 435 (ug/dL)    Saturation Ratios 10 (*) 20 - 55 (%)    UIBC 236  125 - 400 (ug/dL)   VITAMIN B12     Status: Normal   Collection Time   05/21/11  8:20 AM      Component Value Range Comment   Vitamin B-12 543  211 - 911 (pg/mL)   GLUCOSE, CAPILLARY     Status: Abnormal   Collection Time   05/21/11 11:40 AM      Component Value Range Comment   Glucose-Capillary 140 (*) 70 - 99 (mg/dL)     IMAGING: US Renal Port  05/20/2011  *RADIOLOGY REPORT*  Clinical Data: Renal failure.  Lymphoma.  Diabetes and hypertension  RENAL/URINARY TRACT ULTRASOUND COMPLETE  Comparison:  Ultrasound 01/13/2011  Findings:  Right Kidney:  11.7 cm.  Negative  Left Kidney:  12.6 cm.  Negative  Bladder:  Empty  IMPRESSION: Negative renal ultrasound.  Original Report Authenticated By: Truett Perna, [name]   Dg Chest Port 1v Same Day  05/20/2011  *RADIOLOGY REPORT*  Clinical Data: Cough, shortness of breath, history of lymphoma  PORTABLE CHEST - 1 VIEW SAME DAY  Comparison: 01/13/2011  Findings: Borderline cardiomegaly noted.  Bilateral central airspace disease noted with asymmetric appearance more prominent in the left upper lobe perihilar and right lower  lobe infrahilar. This are suspicious for bilateral pneumonia rather than asymmetric pulmonary edema.  Clinical correlation is necessary.  IMPRESSION:  Bilateral central airspace disease noted with asymmetric appearance more prominent in the left upper lobe perihilar and right lower lobe infrahilar.  This are suspicious for bilateral pneumonia rather than asymmetric pulmonary edema.  Clinical correlation is necessary.Follow-up to resolution is recommended.  Original Report Authenticated By: Lahoma Crocker, [name]    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 Lasix ordered, renal to see.  Time Spent Directly with Patient: 40 minutes  KILROY,LUKE K 05/21/2011, 2:16 PM   I have seen and examined the patient along with Kerin Ransom, PA.  I have reviewed the chart, notes and new data.  I agree with PA's note.  Clinical picture is that of severe hypovolemia primarily related to acute on chronic renal failure and nephrotic syndrome. In turn this is most likely due to combination of diabetic nephropathy and worsened by chronic nonsteroidal anti-inflammatory drug use. Note greater than 7 g in 24 proteinuria in August. He now has metabolic acidosis with respiratory compensation and is approaching stage IV kidney disease. He appears to be roughly 28 pounds heavier than he was just a few months ago; this is probably all excess fluid  The patient has previously had an extensive cardiac workup including a cardiac catheterization that excluded significant coronary angiography just 2 years  ago. He had an echo done just 4 months ago that  showed normal left ventricular systolic function. He does not have significant cardiomegaly by chest x-ray. Therefore I suspect the left ventricular ejection fraction will still be normal.   He did have moderate concentric left ventricle hypertrophy and therefore is definitely at risk of diastolic heart failure. A followup echocardiogram will be useful in establishing whether or not left atrial pressure is elevated.  Regardless, he needs aggressive diuretic therapy. Blood pressure is still elevated and he needs additional antihypertensives.  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 This could also explain the massive volume retention that is well in excess of his complaints of shortness of breath.  PLAN: Echo Aggressive diuresis whilst monitoring renal function Very high risk of progression to end-stage renal disease; unfortunately we're probably beyond the point where ACE inhibitor/angiotensin receptor blockers will be beneficial. Higher doses of amlodipine may worsen edema. We'll start low-dose carvedilol. Outpatient SLEEP study  Sanda Klein, MD, [facility] [phone] 05/21/2011, 4:35 PM

## 2011-05-22 ENCOUNTER — Other Ambulatory Visit: Payer: Self-pay

## 2011-05-22 DIAGNOSIS — Z9889 Other specified postprocedural states: Secondary | ICD-10-CM | POA: Insufficient documentation

## 2011-05-22 DIAGNOSIS — I82409 Acute embolism and thrombosis of unspecified deep veins of unspecified lower extremity: Secondary | ICD-10-CM | POA: Insufficient documentation

## 2011-05-22 DIAGNOSIS — E11319 Type 2 diabetes mellitus with unspecified diabetic retinopathy without macular edema: Secondary | ICD-10-CM | POA: Insufficient documentation

## 2011-05-22 DIAGNOSIS — E785 Hyperlipidemia, unspecified: Secondary | ICD-10-CM | POA: Insufficient documentation

## 2011-05-22 DIAGNOSIS — C801 Malignant (primary) neoplasm, unspecified: Secondary | ICD-10-CM | POA: Insufficient documentation

## 2011-05-22 DIAGNOSIS — E1121 Type 2 diabetes mellitus with diabetic nephropathy: Secondary | ICD-10-CM | POA: Diagnosis present

## 2011-05-22 DIAGNOSIS — C8590 Non-Hodgkin lymphoma, unspecified, unspecified site: Secondary | ICD-10-CM | POA: Insufficient documentation

## 2011-05-22 LAB — PROTEIN / CREATININE RATIO, URINE
Creatinine, Urine: 42.9 mg/dL
Protein Creatinine Ratio: 4.38 — ABNORMAL HIGH (ref 0.00–0.15)
Total Protein, Urine: 187.8 mg/dL

## 2011-05-22 LAB — BASIC METABOLIC PANEL
BUN: 29 mg/dL — ABNORMAL HIGH (ref 6–23)
CO2: 25 mEq/L (ref 19–32)
Calcium: 9 mg/dL (ref 8.4–10.5)
Chloride: 105 mEq/L (ref 96–112)
Creatinine, Ser: 2.69 mg/dL — ABNORMAL HIGH (ref 0.50–1.35)
GFR calc Af Amer: 28 mL/min — ABNORMAL LOW (ref >90)
GFR calc non Af Amer: 24 mL/min — ABNORMAL LOW (ref >90)
Glucose, Bld: 153 mg/dL — ABNORMAL HIGH (ref 70–99)
Potassium: 3.2 mEq/L — ABNORMAL LOW (ref 3.5–5.1)
Sodium: 138 mEq/L (ref 135–145)

## 2011-05-22 LAB — GLUCOSE, CAPILLARY
Glucose-Capillary: 149 mg/dL — ABNORMAL HIGH (ref 70–99)
Glucose-Capillary: 182 mg/dL — ABNORMAL HIGH (ref 70–99)
Glucose-Capillary: 177 mg/dL — ABNORMAL HIGH (ref 70–99)
Glucose-Capillary: 154 mg/dL — ABNORMAL HIGH (ref 70–99)

## 2011-05-22 LAB — PROTIME-INR
INR: 2.18 — ABNORMAL HIGH (ref 0.00–1.49)
Prothrombin Time: 24.6 seconds — ABNORMAL HIGH (ref 11.6–15.2)

## 2011-05-22 LAB — CBC
HCT: 24 % — ABNORMAL LOW (ref 39.0–52.0)
Hemoglobin: 7.7 g/dL — ABNORMAL LOW (ref 13.0–17.0)
MCH: 25.5 pg — ABNORMAL LOW (ref 26.0–34.0)
MCHC: 32.1 g/dL (ref 30.0–36.0)
MCV: 79.5 fL (ref 78.0–100.0)
Platelets: 290 10*3/uL (ref 150–400)
RBC: 3.02 MIL/uL — ABNORMAL LOW (ref 4.22–5.81)
RDW: 16.7 % — ABNORMAL HIGH (ref 11.5–15.5)
WBC: 4.7 10*3/uL (ref 4.0–10.5)

## 2011-05-22 LAB — CREATININE, URINE, RANDOM: Creatinine, Urine: 47.3 mg/dL

## 2011-05-22 LAB — SODIUM, URINE, RANDOM: Sodium, Ur: 97 mEq/L

## 2011-05-22 MED ORDER — WARFARIN SODIUM 10 MG PO TABS
10.0000 mg | ORAL_TABLET | Freq: Once | ORAL | Status: AC
  Administered 2011-05-22: 10 mg via ORAL
  Filled 2011-05-22: qty 1

## 2011-05-22 MED ORDER — POTASSIUM CHLORIDE CRYS ER 20 MEQ PO TBCR
20.0000 meq | EXTENDED_RELEASE_TABLET | Freq: Two times a day (BID) | ORAL | Status: DC
  Administered 2011-05-22 (×2): 20 meq via ORAL
  Filled 2011-05-22 (×4): qty 1

## 2011-05-22 MED ORDER — INSULIN GLARGINE 100 UNIT/ML ~~LOC~~ SOLN
50.0000 [IU] | Freq: Every day | SUBCUTANEOUS | Status: DC
  Administered 2011-05-22 – 2011-05-24 (×3): 50 [IU] via SUBCUTANEOUS

## 2011-05-22 MED ORDER — CARVEDILOL 6.25 MG PO TABS
6.2500 mg | ORAL_TABLET | Freq: Two times a day (BID) | ORAL | Status: DC
  Administered 2011-05-23 – 2011-05-25 (×4): 6.25 mg via ORAL
  Filled 2011-05-22 (×5): qty 1

## 2011-05-22 NOTE — Clinical Documentation Improvement (Signed)
GENERIC DOCUMENTATION CLARIFICATION QUERY  THIS DOCUMENT IS NOT A PERMANENT PART OF THE MEDICAL RECORD  TO RESPOND TO THE THIS QUERY, FOLLOW THE INSTRUCTIONS BELOW:  1. If needed, update documentation for the patient's encounter via the notes activity.  2. Access this query again and click edit on the In [employer].  3. After updating, or not, click F2 to complete all highlighted (required) fields concerning your review. Select "additional documentation in the medical record" OR "no additional documentation provided".  4. Click Sign note button.  5. The deficiency will fall out of your In Basket *Please let us know if you are not able to complete this workflow by phone or e-mail (listed below).  Please update your documentation within the medical record to reflect your response to this query.                                                                                        05/22/11   Dear Dr.Aronson, [facility],  In a better effort to capture your patient's severity of illness, reflect appropriate length of stay and utilization of resources, a review of the patient medical record has revealed the following indicators.    Based on your clinical judgment, please clarify and document in a progress note and/or discharge summary the clinical condition associated with the following supporting information:  In responding to this query please exercise your independent judgment.  The fact that a query is asked, does not imply that any particular answer is desired or expected.   Pt admitted with Acute on Chronic Diastolic CHF/ PNA.   According to cn on 05/21/11 on Morbid obesity suspected sleep apnea. Please clarify whether or not Morbid obesity suspected sleep apnea can be further classified as one of the diagnoses listed below and document in pn and d/c summary. Thank You!     Possible Clinical Conditions?  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 This could also explain the massive volume retention that is well in excess of his complaints of shortness of breath.   Diagnostics:  Treatment O2 3L/[place]  You may use possible, probable, or suspect with inpatient documentation. possible, probable, suspected diagnoses MUST be documented at the time of discharge  Reviewed:  no additional documentation provided  No new updates from MD. 05/28/2011 ljh  Thank You,  Heloise Beecham  RN, BSN, [place] Documentation Specialist [facility] Dept Pager: [phone] / E-mail: [email or web address]

## 2011-05-22 NOTE — Progress Notes (Signed)
ANTICOAGULATION CONSULT NOTE - Follow Up  Pharmacy Consult for warfarin Indication: hx DVT  No Known Allergies  Patient Measurements: Height: 6\' 3"  (190.5 cm) Weight: 338 lb 6.4 oz (153.497 kg) IBW/kg (Calculated) : 84.5   Vital Signs: Temp: 97.8 F (36.6 C) (12/21 0533) Temp src: Oral (12/21 0533) BP: 160/74 mmHg (12/21 0533) Pulse Rate: 87  (12/21 0533)  Labs:  Basename 05/22/11 0500 05/21/11 0448 05/20/11 2045 05/20/11 1240  HGB 7.7* -- 7.9* --  HCT 24.0* -- 24.9* 25.1*  PLT 290 -- 295 293  APTT -- -- 84* --  LABPROT 24.6* 27.6* -- 26.2*  INR 2.18* 2.52* -- 2.36*  HEPARINUNFRC -- -- -- --  CREATININE 2.69* 2.86* 2.71* --  CKTOTAL -- -- -- --  CKMB -- -- -- --  TROPONINI -- -- -- --   Estimated Creatinine Clearance: 47.5 ml/min (by C-G formula based on Cr of 2.69).  Medical History: Past Medical History  Diagnosis Date  . Cancer   . Diabetes mellitus   . DVT (deep venous thrombosis)   . Diabetic nephropathy   . Diabetic retinopathy   . Hypertension   . Hyperlipidemia   . Non Hodgkin's lymphoma     Tx 2009  . History of cardiac catheterization     Medications:  Scheduled:     . amLODipine  5 mg Oral Daily  . azithromycin  500 mg Intravenous Q24H  . carvedilol  3.125 mg Oral BID WC  . cefTRIAXone (ROCEPHIN)  IV  1 g Intravenous Q24H  . darbepoetin (ARANESP) injection - NON-DIALYSIS  60 mcg Subcutaneous Q Thu-1800  . ferumoxytol  510 mg Intravenous 3 days  . furosemide  80 mg Intravenous Q12H  . insulin aspart  0-20 Units Subcutaneous TID WC  . insulin aspart  0-5 Units Subcutaneous QHS  . insulin glargine  50 Units Subcutaneous QHS  . polysaccharide iron  150 mg Oral Daily  . potassium chloride SA  20 mEq Oral BID  . sodium chloride  3 mL Intravenous Q12H  . warfarin  7.5 mg Oral ONCE-1800  . DISCONTD: benazepril  20 mg Oral Daily  . DISCONTD: insulin glargine  40 Units Subcutaneous QHS  . DISCONTD: potassium chloride SA  20 mEq Oral Daily     Assessment: 60YOM continuing warfarin for hx DVT.  Patient's INR was therapeutic on admission on home dosing schedule of warfarin 7.5mg  daily except 10mg  on MWF. No bleeding/complications reported. Potential drug interaction with azithromycin (started 12/19) noted, may cause an increase in INR.  Currently, INR remains therapeutic so will continue with home regimen.  Goal of Therapy:  INR 2-3   Plan:  Warfarin 10mg  po once today. Follow up INR in AM.  Hershal Coria, PharmD Pager: [phone] 05/22/2011 10:13 AM

## 2011-05-22 NOTE — Progress Notes (Signed)
05/22/2011 Calla Kicks BSN CCM [phone] CM spoke with patient. Plans are for patient to return to his home in [facility] where brother will be caregiver. Pt has used [facility] in the past and wishes to use them again if services are needed. Pt states he is not needing equipment assistance to get to bathroom. Currently patient is using oxygen intermittently in room. Pt states he has prescription coverage through Medicare part D with [employer]-pays $2 to 8 dollars co-pay for routine meds. Uses [facility] in [employer]. Pt has been referred to [facility].Will patient need home oxygen?  CM will follow

## 2011-05-22 NOTE — Progress Notes (Signed)
Subjective: Patient is doing much better by his report. He is sitting up in a chair breathing easy conversing. He is still wearing his oxygen is down to 2 L nasal cannula. He has had no cough or sputum. He denies chest pain nausea or vomiting. His lower extremity swelling is slowly improving. His by mouth intake is excellent  Objective: Vital signs in last 24 hours: Temp:  [97.4 F (36.3 C)-97.8 F (36.6 C)] 97.8 F (36.6 C) (12/21 0533) Pulse Rate:  [87-89] 87  (12/21 0533) Resp:  [18-21] 20  (12/21 0533) BP: (144-160)/(70-74) 160/74 mmHg (12/21 0533) SpO2:  [95 %-97 %] 95 % (12/21 0533) Weight:  [153.497 kg (338 lb 6.4 oz)] 338 lb 6.4 oz (153.497 kg) (12/21 0559) Weight change: 8.346 kg (18 lb 6.4 oz)  CBG (last 3)   Basename 05/22/11 0711 05/21/11 2036 05/21/11 1655  GLUCAP 149* 193* 199*    Intake/Output from previous day: 12/20 0701 - 12/21 0700 In: 1320 [P.O.:1320] Out: 3950 [Urine:3950]  Physical Exam: Patient is awake alert conversant no distress wearing oxygen. He is no JVD or bruits. Lungs overall are clear with diminished breath sounds at the bases. Heart sounds are distant normal S1-S2 regular no murmur. Abdomen is soft nontender bowel sounds normal. Extremities do reveal 1-2+ edema chronic venous stasis changes improved from admission. Neurologically he is intact.   Lab Results:  Basename 05/22/11 0500 05/21/11 0448 05/20/11 2045  NA 138 139 --  K 3.2* 3.6 --  CL 105 104 --  CO2 25 25 --  GLUCOSE 153* 243* --  BUN 29* 32* --  CREATININE 2.69* 2.86* --  CALCIUM 9.0 9.1 --  MG -- -- 2.1  PHOS -- -- --    [facility] 05/20/11 2045 05/20/11 1240  AST 20 17  ALT 14 16  ALKPHOS 118* 122*  BILITOT 0.3 0.3  PROT 6.4 6.5  ALBUMIN 2.5* 2.5*    Basename 05/22/11 0500 05/20/11 2045 05/20/11 1240  WBC 4.7 5.7 --  NEUTROABS -- 4.0 4.2  HGB 7.7* 7.9* --  HCT 24.0* 24.9* --  MCV 79.5 80.1 --  PLT 290 295 --   Lab Results  Component Value Date   INR 2.18*  05/22/2011   INR 2.52* 05/21/2011   INR 2.36* 05/20/2011   No results found for this basename: CKTOTAL:3,CKMB:3,CKMBINDEX:3,TROPONINI:3 in the last 72 hours No results found for this basename: TSH,T4TOTAL,FREET3,T3FREE,THYROIDAB in the last 72 hours  Basename 05/21/11 0820  VITAMINB12 543  FOLATE --  FERRITIN 81  TIBC 261  IRON 25*  RETICCTPCT --    Studies/Results: US Renal Port  05/20/2011  *RADIOLOGY REPORT*  Clinical Data: Renal failure.  Lymphoma.  Diabetes and hypertension  RENAL/URINARY TRACT ULTRASOUND COMPLETE  Comparison:  Ultrasound 01/13/2011  Findings:  Right Kidney:  11.7 cm.  Negative  Left Kidney:  12.6 cm.  Negative  Bladder:  Empty  IMPRESSION: Negative renal ultrasound.  Original Report Authenticated By: Truett Perna, [name]   Dg Chest Port 1v Same Day  05/20/2011  *RADIOLOGY REPORT*  Clinical Data: Cough, shortness of breath, history of lymphoma  PORTABLE CHEST - 1 VIEW SAME DAY  Comparison: 01/13/2011  Findings: Borderline cardiomegaly noted.  Bilateral central airspace disease noted with asymmetric appearance more prominent in the left upper lobe perihilar and right lower lobe infrahilar. This are suspicious for bilateral pneumonia rather than asymmetric pulmonary edema.  Clinical correlation is necessary.  IMPRESSION:  Bilateral central airspace disease noted with asymmetric appearance more prominent in the  left upper lobe perihilar and right lower lobe infrahilar.  This are suspicious for bilateral pneumonia rather than asymmetric pulmonary edema.  Clinical correlation is necessary.Follow-up to resolution is recommended.  Original Report Authenticated By: Lahoma Crocker, [name]     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 Will chest x-ray clinically improved.  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  #5 essential hypertension currently off of ACE inhibitors and coreg has been started- will allow 24-48 hours prior to further adjustments would defer to renal  #6 anemia of chronic disease pharmacy consulted for iron and Aranesp  #7 non-Hodgkin's lymphoma currently in remission  #8 acute on chronic renal failure with underlying diabetic renal disease leave you'll ultimately progressed to dialysis but currently improving on the current regimen  Plan we await the echocardiogram, continue empiric antibiotics and IV diuretics, will adjust insulin and supplement potassium.   LOS: 2 days   Vu Liebman A 05/22/2011, 7:24 AM

## 2011-05-22 NOTE — Progress Notes (Addendum)
Patient ID: [name], male   DOB: 06-05-1950, 60 y.o.   MRN: [id number]   S:Reports improvement in his breathing and denies any N/V  O:BP 145/71  Pulse 88  Temp(Src) 97.5 F (36.4 C) (Oral)  Resp 20  Ht 6\' 3"  (1.905 m)  Wt 153.497 kg (338 lb 6.4 oz)  BMI 42.30 kg/m2  SpO2 95%  Intake/Output Summary (Last 24 hours) at 05/22/11 1454 Last data filed at 05/22/11 1348  Gross per 24 hour  Intake   1400 ml  Output   4150 ml  Net  -2750 ml   Weight change: 8.346 kg (18 lb 6.4 oz) [id number] resting OOB [id number] RRR, normal S1 and S2 Resp:Coarse BS bilaterally [id number], protruberant, NT, BS normal Ext:2-3+ pitting bilateral edema      . amLODipine  5 mg Oral Daily  . azithromycin  500 mg Intravenous Q24H  . carvedilol  3.125 mg Oral BID WC  . cefTRIAXone (ROCEPHIN)  IV  1 g Intravenous Q24H  . darbepoetin (ARANESP) injection - NON-DIALYSIS  60 mcg Subcutaneous Q Thu-1800  . ferumoxytol  510 mg Intravenous 3 days  . furosemide  80 mg Intravenous Q12H  . insulin aspart  0-20 Units Subcutaneous TID WC  . insulin aspart  0-5 Units Subcutaneous QHS  . insulin glargine  50 Units Subcutaneous QHS  . polysaccharide iron  150 mg Oral Daily  . potassium chloride SA  20 mEq Oral BID  . sodium chloride  3 mL Intravenous Q12H  . warfarin  10 mg Oral ONCE-1800  . warfarin  7.5 mg Oral ONCE-1800  . DISCONTD: insulin glargine  40 Units Subcutaneous QHS  . DISCONTD: potassium chloride SA  20 mEq Oral Daily   US Renal Port  05/20/2011  *RADIOLOGY REPORT*  Clinical Data: Renal failure.  Lymphoma.  Diabetes and hypertension  RENAL/URINARY TRACT ULTRASOUND COMPLETE  Comparison:  Ultrasound 01/13/2011  Findings:  Right Kidney:  11.7 cm.  Negative  Left Kidney:  12.6 cm.  Negative  Bladder:  Empty  IMPRESSION: Negative renal ultrasound.  Original Report Authenticated By: Truett Perna, [name]   BMET  Lab 05/22/11 0500 05/21/11 0448 05/20/11 2045 05/20/11 1240  NA 138 139 140 137  K  3.2* 3.6 3.9 3.6  CL 105 104 105 103  CO2 25 25 25 26   GLUCOSE 153* 243* 306* 327*  BUN 29* 32* 33* 31*  CREATININE 2.69* 2.86* 2.71* 2.69*  ALB -- -- -- --  CALCIUM 9.0 9.1 8.9 8.8  PHOS -- -- -- --   CBC  Lab 05/22/11 0500 05/20/11 2045 05/20/11 1240  WBC 4.7 5.7 5.7  NEUTROABS -- 4.0 4.2  HGB 7.7* 7.9* 7.9*  HCT 24.0* 24.9* 25.1*  MCV 79.5 80.1 80.2  PLT 290 295 293     Assessment/Plan:  1. 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 Will continue temporary hold of ACE-I and monitor with labs for now. He will need out-patient renal follow up and suspect that with compliance lapses will eventually need some form of renal replacement therapy. 2. CHF exacerbation VS CAP: on Diuretics and rocephin therapy- clinically improving. 2-D echo pending.  3. Anemia of CKD: Low iron stores noted- getting iron and aranesp  4. 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  5. Hypertension: likely elevated pressure from volume excess, monitor with diuretics and titrate amlodipine  VS add B-blocker.    Britainy Kozub K.

## 2011-05-22 NOTE — Progress Notes (Signed)
[facility]  DAILY PROGRESS NOTE   Subjective:  He reports improvement in his breathing. Still has significant LE edema.  Objective:  Temp:  [97.4 F (36.3 C)-97.8 F (36.6 C)] 97.5 F (36.4 C) (12/21 1346) Pulse Rate:  [87-88] 88  (12/21 1346) Resp:  [20-21] 20  (12/21 1346) BP: (145-160)/(71-74) 145/71 mmHg (12/21 1346) SpO2:  [95 %-97 %] 95 % (12/21 1346) Weight:  [153.497 kg (338 lb 6.4 oz)] 338 lb 6.4 oz (153.497 kg) (12/21 0559) Weight change: 8.346 kg (18 lb 6.4 oz)  Intake/Output from previous day: 12/20 0701 - 12/21 0700 In: 1320 [P.O.:1320] Out: 3950 [Urine:3950]  Intake/Output from this shift: Total I/O In: 560 [P.O.:560] Out: 1500 [Urine:1500]  Medications: Current Facility-Administered Medications  Medication Dose Route Frequency Provider Last Rate Last Dose  . 0.9 %  sodium chloride infusion  250 mL Intravenous PRN Geoffery Lyons, MD      . acetaminophen (TYLENOL) tablet 500 mg  500 mg Oral Q6H PRN Geoffery Lyons, MD      . acetaminophen (TYLENOL) tablet 650 mg  650 mg Oral Q4H PRN Geoffery Lyons, MD   650 mg at 05/21/11 1146  . amLODipine (NORVASC) tablet 5 mg  5 mg Oral Daily Geoffery Lyons, MD   5 mg at 05/22/11 1002  . azithromycin (ZITHROMAX) 500 mg in dextrose 5 % 250 mL IVPB  500 mg Intravenous Q24H Geoffery Lyons, MD   500 mg at 05/22/11 1452  . carvedilol (COREG) tablet 3.125 mg  3.125 mg Oral BID WC Mihai Croitoru, MD   3.125 mg at 05/22/11 1624  . cefTRIAXone (ROCEPHIN) 1 g in dextrose 5 % 50 mL IVPB  1 g Intravenous Q24H Geoffery Lyons, MD   1 g at 05/22/11 1624  . darbepoetin (ARANESP) injection 60 mcg  60 mcg Subcutaneous Q Thu-1800 Hershal Coria, PHARMD   60 mcg at 05/21/11 1714  . ferumoxytol [facility]) injection 510 mg  510 mg Intravenous 3 days Jay K. Posey Pronto, MD   510 mg at 05/21/11 1621  . furosemide (LASIX) injection 80 mg  80 mg Intravenous Q12H Geoffery Lyons, MD   80 mg at 05/22/11 1452  .  guaiFENesin-dextromethorphan (ROBITUSSIN DM) 100-10 MG/5ML syrup 15 mL  15 mL Oral Q4H PRN Geoffery Lyons, MD   15 mL at 05/21/11 2133  . insulin aspart (novoLOG) injection 0-20 Units  0-20 Units Subcutaneous TID WC Geoffery Lyons, MD   4 Units at 05/22/11 1625  . insulin aspart (novoLOG) injection 0-5 Units  0-5 Units Subcutaneous QHS Geoffery Lyons, MD   3 Units at 05/20/11 2234  . insulin glargine (LANTUS) injection 50 Units  50 Units Subcutaneous QHS Geoffery Lyons, MD      . ondansetron [facility]) injection 4 mg  4 mg Intravenous Q6H PRN Geoffery Lyons, MD      . polysaccharide iron (NIFEREX) capsule 150 mg  150 mg Oral Daily Geoffery Lyons, MD   150 mg at 05/22/11 1002  . potassium chloride SA (K-DUR,KLOR-CON) CR tablet 20 mEq  20 mEq Oral BID Geoffery Lyons, MD   20 mEq at 05/22/11 1003  . sodium chloride 0.9 % injection 3 mL  3 mL Intravenous Q12H Geoffery Lyons, MD   3 mL at 05/22/11 1002  . sodium chloride 0.9 % injection 3 mL  3 mL Intravenous PRN Geoffery Lyons, MD      . warfarin (  COUMADIN) tablet 10 mg  10 mg Oral ONCE-1800 Amanda Runyon, PHARMD      . warfarin (COUMADIN) tablet 7.5 mg  7.5 mg Oral ONCE-1800 Hershal Coria, PHARMD   7.5 mg at 05/21/11 1713  . DISCONTD: insulin glargine (LANTUS) injection 40 Units  40 Units Subcutaneous QHS Geoffery Lyons, MD   40 Units at 05/21/11 2134  . DISCONTD: potassium chloride SA (K-DUR,KLOR-CON) CR tablet 20 mEq  20 mEq Oral Daily Geoffery Lyons, MD   20 mEq at 05/21/11 0954    Physical Exam: General appearance: alert and no distress Neck: no adenopathy, no carotid bruit, no JVD, supple, symmetrical, trachea midline and thyroid not enlarged, symmetric, no tenderness/mass/nodules Lungs: clear to auscultation bilaterally Heart: regular rate and rhythm, S1, S2 normal, no murmur, click, rub or gallop Abdomen: soft, non-tender; bowel sounds normal; no masses,  no organomegaly Extremities: extremities normal,  atraumatic, 3+ tense edema Pulses: 2+ and symmetric Skin: Skin color, texture, turgor normal. No rashes or lesions Neurologic: Grossly normal  Lab Results: Results for orders placed during the hospital encounter of 05/20/11 (from the past 48 hour(s))  CBC     Status: Abnormal   Collection Time   05/20/11  8:45 PM      Component Value Range Comment   WBC 5.7  4.0 - 10.5 (K/uL)    RBC 3.11 (*) 4.22 - 5.81 (MIL/uL)    Hemoglobin 7.9 (*) 13.0 - 17.0 (g/dL)    HCT 24.9 (*) 39.0 - 52.0 (%)    MCV 80.1  78.0 - 100.0 (fL)    MCH 25.4 (*) 26.0 - 34.0 (pg)    MCHC 31.7  30.0 - 36.0 (g/dL)    RDW 17.0 (*) 11.5 - 15.5 (%)    Platelets 295  150 - 400 (K/uL)   DIFFERENTIAL     Status: Normal   Collection Time   05/20/11  8:45 PM      Component Value Range Comment   Neutrophils Relative 70  43 - 77 (%)    Neutro Abs 4.0  1.7 - 7.7 (K/uL)    Lymphocytes Relative 21  12 - 46 (%)    Lymphs Abs 1.2  0.7 - 4.0 (K/uL)    Monocytes Relative 7  3 - 12 (%)    Monocytes Absolute 0.4  0.1 - 1.0 (K/uL)    Eosinophils Relative 1  0 - 5 (%)    Eosinophils Absolute 0.1  0.0 - 0.7 (K/uL)    Basophils Relative 0  0 - 1 (%)    Basophils Absolute 0.0  0.0 - 0.1 (K/uL)   COMPREHENSIVE METABOLIC PANEL     Status: Abnormal   Collection Time   05/20/11  8:45 PM      Component Value Range Comment   Sodium 140  135 - 145 (mEq/L)    Potassium 3.9  3.5 - 5.1 (mEq/L)    Chloride 105  96 - 112 (mEq/L)    CO2 25  19 - 32 (mEq/L)    Glucose, Bld 306 (*) 70 - 99 (mg/dL)    BUN 33 (*) 6 - 23 (mg/dL)    Creatinine, Ser 2.71 (*) 0.50 - 1.35 (mg/dL)    Calcium 8.9  8.4 - 10.5 (mg/dL)    Total Protein 6.4  6.0 - 8.3 (g/dL)    Albumin 2.5 (*) 3.5 - 5.2 (g/dL)    AST 20  0 - 37 (U/L)    ALT 14  0 - 53 (U/L)  Alkaline Phosphatase 118 (*) 39 - 117 (U/L)    Total Bilirubin 0.3  0.3 - 1.2 (mg/dL)    GFR calc non Af Amer 24 (*) >90 (mL/min)    GFR calc Af Amer 28 (*) >90 (mL/min)   APTT     Status: Abnormal    Collection Time   05/20/11  8:45 PM      Component Value Range Comment   aPTT 84 (*) 24 - 37 (seconds)   MAGNESIUM     Status: Normal   Collection Time   05/20/11  8:45 PM      Component Value Range Comment   Magnesium 2.1  1.5 - 2.5 (mg/dL)   GLUCOSE, CAPILLARY     Status: Abnormal   Collection Time   05/20/11  8:45 PM      Component Value Range Comment   Glucose-Capillary 287 (*) 70 - 99 (mg/dL)    Comment 1 Notify RN      Comment 2 Documented in Chart     BASIC METABOLIC PANEL     Status: Abnormal   Collection Time   05/21/11  4:48 AM      Component Value Range Comment   Sodium 139  135 - 145 (mEq/L)    Potassium 3.6  3.5 - 5.1 (mEq/L)    Chloride 104  96 - 112 (mEq/L)    CO2 25  19 - 32 (mEq/L)    Glucose, Bld 243 (*) 70 - 99 (mg/dL)    BUN 32 (*) 6 - 23 (mg/dL)    Creatinine, Ser 2.86 (*) 0.50 - 1.35 (mg/dL)    Calcium 9.1  8.4 - 10.5 (mg/dL)    GFR calc non Af Amer 23 (*) >90 (mL/min)    GFR calc Af Amer 26 (*) >90 (mL/min)   PROTIME-INR     Status: Abnormal   Collection Time   05/21/11  4:48 AM      Component Value Range Comment   Prothrombin Time 27.6 (*) 11.6 - 15.2 (seconds)    INR 2.52 (*) 0.00 - 1.49    GLUCOSE, CAPILLARY     Status: Abnormal   Collection Time   05/21/11  7:45 AM      Component Value Range Comment   Glucose-Capillary 177 (*) 70 - 99 (mg/dL)    Comment 1 Notify RN     FERRITIN     Status: Normal   Collection Time   05/21/11  8:20 AM      Component Value Range Comment   Ferritin 81  22 - 322 (ng/mL)   IRON AND TIBC     Status: Abnormal   Collection Time   05/21/11  8:20 AM      Component Value Range Comment   Iron 25 (*) 42 - 135 (ug/dL)    TIBC 261  215 - 435 (ug/dL)    Saturation Ratios 10 (*) 20 - 55 (%)    UIBC 236  125 - 400 (ug/dL)   VITAMIN B12     Status: Normal   Collection Time   05/21/11  8:20 AM      Component Value Range Comment   Vitamin B-12 543  211 - 911 (pg/mL)   GLUCOSE, CAPILLARY     Status: Abnormal    Collection Time   05/21/11 11:40 AM      Component Value Range Comment   Glucose-Capillary 140 (*) 70 - 99 (mg/dL)   GLUCOSE, CAPILLARY     Status: Abnormal   Collection  Time   05/21/11  4:55 PM      Component Value Range Comment   Glucose-Capillary 199 (*) 70 - 99 (mg/dL)   SODIUM, URINE, RANDOM     Status: Normal   Collection Time   05/21/11  7:41 PM      Component Value Range Comment   Sodium, Ur 97     CREATININE, URINE, RANDOM     Status: Normal   Collection Time   05/21/11  7:41 PM      Component Value Range Comment   Creatinine, Urine 47.3     PROTEIN / CREATININE RATIO, URINE     Status: Abnormal   Collection Time   05/21/11  7:41 PM      Component Value Range Comment   Creatinine, Urine 42.90      Total Protein, Urine 187.8   NO NORMAL RANGE ESTABLISHED FOR THIS TEST   PROTEIN CREATININE RATIO 4.38 (*) 0.00 - 0.15    GLUCOSE, CAPILLARY     Status: Abnormal   Collection Time   05/21/11  8:36 PM      Component Value Range Comment   Glucose-Capillary 193 (*) 70 - 99 (mg/dL)   BASIC METABOLIC PANEL     Status: Abnormal   Collection Time   05/22/11  5:00 AM      Component Value Range Comment   Sodium 138  135 - 145 (mEq/L)    Potassium 3.2 (*) 3.5 - 5.1 (mEq/L)    Chloride 105  96 - 112 (mEq/L)    CO2 25  19 - 32 (mEq/L)    Glucose, Bld 153 (*) 70 - 99 (mg/dL)    BUN 29 (*) 6 - 23 (mg/dL)    Creatinine, Ser 2.69 (*) 0.50 - 1.35 (mg/dL)    Calcium 9.0  8.4 - 10.5 (mg/dL)    GFR calc non Af Amer 24 (*) >90 (mL/min)    GFR calc Af Amer 28 (*) >90 (mL/min)   PROTIME-INR     Status: Abnormal   Collection Time   05/22/11  5:00 AM      Component Value Range Comment   Prothrombin Time 24.6 (*) 11.6 - 15.2 (seconds)    INR 2.18 (*) 0.00 - 1.49    CBC     Status: Abnormal   Collection Time   05/22/11  5:00 AM      Component Value Range Comment   WBC 4.7  4.0 - 10.5 (K/uL)    RBC 3.02 (*) 4.22 - 5.81 (MIL/uL)    Hemoglobin 7.7 (*) 13.0 - 17.0 (g/dL)    HCT 24.0 (*)  39.0 - 52.0 (%)    MCV 79.5  78.0 - 100.0 (fL)    MCH 25.5 (*) 26.0 - 34.0 (pg)    MCHC 32.1  30.0 - 36.0 (g/dL)    RDW 16.7 (*) 11.5 - 15.5 (%)    Platelets 290  150 - 400 (K/uL)   GLUCOSE, CAPILLARY     Status: Abnormal   Collection Time   05/22/11  7:11 AM      Component Value Range Comment   Glucose-Capillary 149 (*) 70 - 99 (mg/dL)   GLUCOSE, CAPILLARY     Status: Abnormal   Collection Time   05/22/11 10:57 AM      Component Value Range Comment   Glucose-Capillary 182 (*) 70 - 99 (mg/dL)   GLUCOSE, CAPILLARY     Status: Abnormal   Collection Time   05/22/11  4:11 PM  Component Value Range Comment   Glucose-Capillary 177 (*) 70 - 99 (mg/dL)     Imaging: US Renal Port  05/20/2011  *RADIOLOGY REPORT*  Clinical Data: Renal failure.  Lymphoma.  Diabetes and hypertension  RENAL/URINARY TRACT ULTRASOUND COMPLETE  Comparison:  Ultrasound 01/13/2011  Findings:  Right Kidney:  11.7 cm.  Negative  Left Kidney:  12.6 cm.  Negative  Bladder:  Empty  IMPRESSION: Negative renal ultrasound.  Original Report Authenticated By: Truett Perna, [name]   2D Echocardiogram: - Left ventricle: The cavity size was normal. There was moderate concentric hypertrophy. Systolic function was normal. The estimated ejection fraction was in the range of 60% to 65%. Wall motion was normal; there were no regional wall motion abnormalities. - Aortic root: The aortic root was mildly dilated. - Mitral valve: Mild regurgitation. - Left atrium: The atrium was moderately dilated. - Right atrium: The atrium was mildly dilated. - Pericardium, extracardiac: A trivial pericardial effusion was identified.  Assessment:  1. Active Problems: 2.  DIABETES MELLITUS-TYPE II 3.  Renal failure, unspecified 4.  Anemia 5.  Shortness of breath dyspnea 6.  Hypertension 7.  Diabetic nephropathy 8.   Plan:  1. Continue diuresis per nephrology. 2. Increase coreg to 6.25 mg po BID.  Time Spent Directly with  Patient:  15 minutes  Length of Stay:  LOS: 2 days   Pixie Casino, MD Attending Cardiologist [facility] C 05/22/2011, 4:57 PM

## 2011-05-23 LAB — BASIC METABOLIC PANEL
BUN: 28 mg/dL — ABNORMAL HIGH (ref 6–23)
CO2: 26 mEq/L (ref 19–32)
Calcium: 8.9 mg/dL (ref 8.4–10.5)
Chloride: 108 mEq/L (ref 96–112)
Creatinine, Ser: 2.81 mg/dL — ABNORMAL HIGH (ref 0.50–1.35)
GFR calc Af Amer: 27 mL/min — ABNORMAL LOW (ref >90)
GFR calc non Af Amer: 23 mL/min — ABNORMAL LOW (ref >90)
Glucose, Bld: 68 mg/dL — ABNORMAL LOW (ref 70–99)
Potassium: 3.3 mEq/L — ABNORMAL LOW (ref 3.5–5.1)
Sodium: 142 mEq/L (ref 135–145)

## 2011-05-23 LAB — OCCULT BLOOD X 1 CARD TO LAB, STOOL
Fecal Occult Bld: NEGATIVE
Fecal Occult Bld: NEGATIVE

## 2011-05-23 LAB — CBC
HCT: 25.3 % — ABNORMAL LOW (ref 39.0–52.0)
Hemoglobin: 8.1 g/dL — ABNORMAL LOW (ref 13.0–17.0)
MCH: 25.7 pg — ABNORMAL LOW (ref 26.0–34.0)
MCHC: 32 g/dL (ref 30.0–36.0)
MCV: 80.3 fL (ref 78.0–100.0)
Platelets: 321 10*3/uL (ref 150–400)
RBC: 3.15 MIL/uL — ABNORMAL LOW (ref 4.22–5.81)
RDW: 16.9 % — ABNORMAL HIGH (ref 11.5–15.5)
WBC: 4.2 10*3/uL (ref 4.0–10.5)

## 2011-05-23 LAB — PROTIME-INR
INR: 2.18 — ABNORMAL HIGH (ref 0.00–1.49)
Prothrombin Time: 24.6 seconds — ABNORMAL HIGH (ref 11.6–15.2)

## 2011-05-23 LAB — GLUCOSE, CAPILLARY
Glucose-Capillary: 71 mg/dL (ref 70–99)
Glucose-Capillary: 124 mg/dL — ABNORMAL HIGH (ref 70–99)
Glucose-Capillary: 160 mg/dL — ABNORMAL HIGH (ref 70–99)
Glucose-Capillary: 155 mg/dL — ABNORMAL HIGH (ref 70–99)

## 2011-05-23 MED ORDER — FUROSEMIDE 10 MG/ML IJ SOLN
80.0000 mg | Freq: Three times a day (TID) | INTRAMUSCULAR | Status: DC
  Administered 2011-05-23 – 2011-05-25 (×7): 80 mg via INTRAVENOUS
  Filled 2011-05-23 (×9): qty 8

## 2011-05-23 MED ORDER — POTASSIUM CHLORIDE CRYS ER 20 MEQ PO TBCR
20.0000 meq | EXTENDED_RELEASE_TABLET | Freq: Three times a day (TID) | ORAL | Status: DC
  Administered 2011-05-23 – 2011-05-25 (×7): 20 meq via ORAL
  Filled 2011-05-23 (×9): qty 1

## 2011-05-23 MED ORDER — WARFARIN SODIUM 7.5 MG PO TABS
7.5000 mg | ORAL_TABLET | Freq: Once | ORAL | Status: AC
  Administered 2011-05-23: 7.5 mg via ORAL
  Filled 2011-05-23: qty 1

## 2011-05-23 MED ORDER — SPIRONOLACTONE 12.5 MG HALF TABLET
12.5000 mg | ORAL_TABLET | Freq: Once | ORAL | Status: AC
  Administered 2011-05-23: 12.5 mg via ORAL
  Filled 2011-05-23 (×2): qty 1

## 2011-05-23 NOTE — Progress Notes (Signed)
Subjective: He feels okay today without dyspnea at rest sitting upright. He is most concerned by continued moderate bilateral leg edema. He has not had chest pain or palpitations. We appreciate the ongoing input from consultants.  Objective: Vital signs in last 24 hours: Temp:  [97.3 F (36.3 C)-97.5 F (36.4 C)] 97.5 F (36.4 C) (12/22 0655) Pulse Rate:  [85-88] 85  (12/22 0655) Resp:  [20] 20  (12/22 0655) BP: (145-168)/(71-79) 168/79 mmHg (12/22 0655) SpO2:  [92 %-95 %] 92 % (12/22 0655) Weight:  [148.19 kg (326 lb 11.2 oz)] 326 lb 11.2 oz (148.19 kg) (12/22 0655) Weight change: -5.307 kg (-11 lb 11.2 oz)   Intake/Output from previous day: 12/21 0701 - 12/22 0700 In: 1886 [P.O.:1262; IV Piggyback:624] Out: 4060 [Urine:4060]   General appearance: alert, cooperative and no distress Resp: clear to auscultation bilaterally Cardio: regular rate and rhythm Extremities: edema He has bilateral 3+ pitting edema of the legs  Lab Results:  Basename 05/23/11 0554 05/22/11 0500  WBC 4.2 4.7  HGB 8.1* 7.7*  HCT 25.3* 24.0*  PLT 321 290   BMET  Basename 05/23/11 0554 05/22/11 0500  NA 142 138  K 3.3* 3.2*  CL 108 105  CO2 26 25  GLUCOSE 68* 153*  BUN 28* 29*  CREATININE 2.81* 2.69*  CALCIUM 8.9 9.0   CMET CMP     Component Value Date/Time   NA 142 05/23/2011 0554   K 3.3* 05/23/2011 0554   CL 108 05/23/2011 0554   CO2 26 05/23/2011 0554   GLUCOSE 68* 05/23/2011 0554   BUN 28* 05/23/2011 0554   CREATININE 2.81* 05/23/2011 0554   CREATININE 4.44* 01/14/2011 1428   CALCIUM 8.9 05/23/2011 0554   CALCIUM 7.8* 01/14/2011 1001   PROT 6.4 05/20/2011 2045   ALBUMIN 2.5* 05/20/2011 2045   AST 20 05/20/2011 2045   ALT 14 05/20/2011 2045   ALKPHOS 118* 05/20/2011 2045   BILITOT 0.3 05/20/2011 2045   GFRNONAA 23* 05/23/2011 0554   GFRAA 27* 05/23/2011 0554    CBG (last 3)   Basename 05/23/11 0756 05/22/11 2136 05/22/11 1611  GLUCAP 71 154* 177*    INR RESULTS:     Lab Results  Component Value Date   INR 2.18* 05/23/2011   INR 2.18* 05/22/2011   INR 2.52* 05/21/2011     Studies/Results: No results found.  Medications: I have reviewed the patient's current medications.  Assessment/Plan: #1 Diabetes Mellitus, Type II: Under reasonable control on current medications. #2 Bilateral Leg Edema: Most likely multifactorial in cause. The edema is likely from hypoalbuminemia as well as side effect from amlodipine, and there could be a contribution from right-sided heart failure. We will check a complete metabolic panel as well as BNP test tomorrow (doubt that this would be normal, but could be used to help monitor intravascular volume status). Intake and output monitoring indicates that he continues to be slowly losing weight as planned. #3 Hypokalemia: Most likely from high dose IV Lasix treatment. We'll give one-time dose of Aldactone 12.5 mg by mouth and recheck electrolytes in the morning.  LOS: 3 days   Jenille Laszlo G 05/23/2011, 12:12 PM

## 2011-05-23 NOTE — Progress Notes (Signed)
ANTICOAGULATION CONSULT NOTE - Follow Up Consult  Pharmacy Consult for: warfarin Indication: Hx DVT  No Known Allergies  Patient Measurements: Height: 6\' 3"  (190.5 cm) Weight: 326 lb 11.2 oz (148.19 kg) IBW/kg (Calculated) : 84.5    Vital Signs: Temp: 97.5 F (36.4 C) (12/22 0655) Temp src: Oral (12/22 0655) BP: 168/79 mmHg (12/22 0655) Pulse Rate: 85  (12/22 0655)  Labs:  Basename 05/23/11 0554 05/22/11 0500 05/21/11 0448 05/20/11 2045  HGB 8.1* 7.7* -- --  HCT 25.3* 24.0* -- 24.9*  PLT 321 290 -- 295  APTT -- -- -- 84*  LABPROT 24.6* 24.6* 27.6* --  INR 2.18* 2.18* 2.52* --  HEPARINUNFRC -- -- -- --  CREATININE 2.81* 2.69* 2.86* --  CKTOTAL -- -- -- --  CKMB -- -- -- --  TROPONINI -- -- -- --   Estimated Creatinine Clearance: 44.6 ml/min (by C-G formula based on Cr of 2.81).   Medications:  Medications:  Scheduled:   .  amLODipine  5 mg  Oral  Daily   .  azithromycin  500 mg  Intravenous  Q24H   .  carvedilol  3.125 mg  Oral  BID WC   .  cefTRIAXone (ROCEPHIN) IV  1 g  Intravenous  Q24H   .  darbepoetin (ARANESP) injection - NON-DIALYSIS  60 mcg  Subcutaneous  Q Thu-1800   .  ferumoxytol  510 mg  Intravenous  3 days   .  furosemide  80 mg  Intravenous  Q12H   .  insulin aspart  0-20 Units  Subcutaneous  TID WC   .  insulin aspart  0-5 Units  Subcutaneous  QHS   .  insulin glargine  50 Units  Subcutaneous  QHS   .  polysaccharide iron  150 mg  Oral  Daily   .  potassium chloride SA  20 mEq  Oral  BID   .  sodium chloride  3 mL  Intravenous  Q12H   .  warfarin  7.5 mg  Oral  ONCE-1800   .  DISCONTD: benazepril  20 mg  Oral  Daily   .  DISCONTD: insulin glargine  40 Units  Subcutaneous  QHS   .  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  INR remains therapeutic (2.18) on home dosing of 7.5 mg daily except 10 mg MWF.  H/H and plt stable. No bleeding/complications reported.  Potential drug  interaction with azithromycin started 12/19, may cause increase in INR - does not seem to be affecting pt. INR at this time.    Goal of Therapy:  INR 2-3  Plan:  Warfarin 7.5 mg po x 1 dose today.  F/U morning PT/INR and CBC.    Glee Arvin PharmD 9:03 AM 05/23/2011

## 2011-05-23 NOTE — Progress Notes (Signed)
Patient ID: [name], male   DOB: Aug 20, 1950, 60 y.o.   MRN: [id number]  S:States he is feeling better with minimal SOB and CP. Able to ambulate in room without difficulty.  O:BP 168/79  Pulse 85  Temp(Src) 97.5 F (36.4 C) (Oral)  Resp 20  Ht 6\' 3"  (1.905 m)  Wt 148.19 kg (326 lb 11.2 oz)  BMI 40.83 kg/m2  SpO2 92%  Intake/Output Summary (Last 24 hours) at 05/23/11 [id number] Last data filed at 05/23/11 0656  Gross per 24 hour  Intake   1446 ml  Output   3160 ml  Net  -1714 ml   Weight change: -5.307 kg (-11 lb 11.2 oz) [id number] sitting OOB in recliner (O2 via [place]) [id number] RRR, normal S1 and S2 sounds Resp:CTA bilaterally, no evident rales [id number], obese, NT, BS normal Ext:2-3+ edema (pitting)      . amLODipine  5 mg Oral Daily  . azithromycin  500 mg Intravenous Q24H  . carvedilol  6.25 mg Oral BID WC  . cefTRIAXone (ROCEPHIN)  IV  1 g Intravenous Q24H  . darbepoetin (ARANESP) injection - NON-DIALYSIS  60 mcg Subcutaneous Q Thu-1800  . ferumoxytol  510 mg Intravenous 3 days  . furosemide  80 mg Intravenous Q12H  . insulin aspart  0-20 Units Subcutaneous TID WC  . insulin aspart  0-5 Units Subcutaneous QHS  . insulin glargine  50 Units Subcutaneous QHS  . polysaccharide iron  150 mg Oral Daily  . potassium chloride SA  20 mEq Oral BID  . sodium chloride  3 mL Intravenous Q12H  . warfarin  10 mg Oral ONCE-1800  . DISCONTD: carvedilol  3.125 mg Oral BID WC   No results found. BMET  Lab 05/23/11 0554 05/22/11 0500 05/21/11 0448 05/20/11 2045 05/20/11 1240  NA 142 138 139 140 137  K 3.3* 3.2* 3.6 3.9 3.6  CL 108 105 104 105 103  CO2 26 25 25 25 26   GLUCOSE 68* 153* 243* 306* 327*  BUN 28* 29* 32* 33* 31*  CREATININE 2.81* 2.69* 2.86* 2.71* 2.69*  ALB -- -- -- -- --  CALCIUM 8.9 9.0 9.1 8.9 8.8  PHOS -- -- -- -- --   CBC  Lab 05/23/11 0554 05/22/11 0500 05/20/11 2045 05/20/11 1240  WBC 4.2 4.7 5.7 5.7  NEUTROABS -- -- 4.0 4.2  HGB 8.1* 7.7* 7.9*  7.9*  HCT 25.3* 24.0* 24.9* 25.1*  MCV 80.3 79.5 80.1 80.2  PLT 321 290 295 293     Assessment/Plan:  1. 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 Will continue temporary hold of ACE-I and monitor with labs for now. He will need out-patient renal follow up and suspect that with compliance lapses will eventually need some form of renal replacement therapy. Continues to show good response to diuresis with improved pedal edema and respiratory symptoms. 2. CHF exacerbation VS CAP: on Diuretics and rocephin therapy- clinically improving.   3. Anemia of CKD: Low iron stores noted- getting iron and aranesp  4. 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  5. Hypertension: likely elevated pressure from volume excess, monitor with diuretics and titrate amlodipine VS add B-blocker. 6. Hypokalemia: Due to kaliuresis from furosemide, will supplement repletion    Teon Hudnall K.

## 2011-05-24 LAB — PROTIME-INR
INR: 2.1 — ABNORMAL HIGH (ref 0.00–1.49)
Prothrombin Time: 23.9 seconds — ABNORMAL HIGH (ref 11.6–15.2)

## 2011-05-24 LAB — BASIC METABOLIC PANEL
BUN: 28 mg/dL — ABNORMAL HIGH (ref 6–23)
CO2: 26 mEq/L (ref 19–32)
Calcium: 9 mg/dL (ref 8.4–10.5)
Chloride: 103 mEq/L (ref 96–112)
Creatinine, Ser: 2.86 mg/dL — ABNORMAL HIGH (ref 0.50–1.35)
GFR calc Af Amer: 26 mL/min — ABNORMAL LOW (ref >90)
GFR calc non Af Amer: 23 mL/min — ABNORMAL LOW (ref >90)
Glucose, Bld: 150 mg/dL — ABNORMAL HIGH (ref 70–99)
Potassium: 3.6 mEq/L (ref 3.5–5.1)
Sodium: 138 mEq/L (ref 135–145)

## 2011-05-24 LAB — GLUCOSE, CAPILLARY
Glucose-Capillary: 133 mg/dL — ABNORMAL HIGH (ref 70–99)
Glucose-Capillary: 217 mg/dL — ABNORMAL HIGH (ref 70–99)
Glucose-Capillary: 72 mg/dL (ref 70–99)

## 2011-05-24 LAB — CBC
HCT: 26.2 % — ABNORMAL LOW (ref 39.0–52.0)
Hemoglobin: 8.4 g/dL — ABNORMAL LOW (ref 13.0–17.0)
MCH: 25.8 pg — ABNORMAL LOW (ref 26.0–34.0)
MCHC: 32.1 g/dL (ref 30.0–36.0)
MCV: 80.4 fL (ref 78.0–100.0)
Platelets: 371 10*3/uL (ref 150–400)
RBC: 3.26 MIL/uL — ABNORMAL LOW (ref 4.22–5.81)
RDW: 16.9 % — ABNORMAL HIGH (ref 11.5–15.5)
WBC: 4.6 10*3/uL (ref 4.0–10.5)

## 2011-05-24 LAB — PRO B NATRIURETIC PEPTIDE: Pro B Natriuretic peptide (BNP): 2985 pg/mL — ABNORMAL HIGH (ref 0–125)

## 2011-05-24 MED ORDER — WARFARIN SODIUM 7.5 MG PO TABS
7.5000 mg | ORAL_TABLET | Freq: Once | ORAL | Status: AC
  Administered 2011-05-24: 7.5 mg via ORAL
  Filled 2011-05-24: qty 1

## 2011-05-24 MED ORDER — SPIRONOLACTONE 25 MG PO TABS
25.0000 mg | ORAL_TABLET | Freq: Once | ORAL | Status: AC
  Administered 2011-05-24: 25 mg via ORAL
  Filled 2011-05-24 (×2): qty 1

## 2011-05-24 NOTE — Progress Notes (Signed)
Patient ID: ZABIEN CUTHBERTSON, male   DOB: 21-Aug-1950, 60 y.o.   MRN: [id number]  S:States that he is feeling better, able to ambulate around the room without any problems. States pedal edema unchanged.   O:BP 146/64  Pulse 84  Temp(Src) 98.4 F (36.9 C) (Oral)  Resp 18  Ht 6\' 3"  (1.905 m)  Wt 144.3 kg (318 lb 2 oz)  BMI 39.76 kg/m2  SpO2 97%  Intake/Output Summary (Last 24 hours) at 05/24/11 0856 Last data filed at 05/24/11 [id number]  Gross per 24 hour  Intake    603 ml  Output   3425 ml  Net  -2822 ml   Weight change: -3.89 kg (-8 lb 9.2 oz)  [id number] resting out of bed in his recliner. Breakfast tray empty. [id number] RRR, S1 and S2 with ESM Resp:CTA bilaterally, no rales/rhonchi [id number], obese, NT, BS normal Ext: 2+ to 3+ bilateral pitting edema. No bullous lesions.      Marland Kitchen amLODipine  5 mg Oral Daily  . azithromycin  500 mg Intravenous Q24H  . carvedilol  6.25 mg Oral BID WC  . cefTRIAXone (ROCEPHIN)  IV  1 g Intravenous Q24H  . darbepoetin (ARANESP) injection - NON-DIALYSIS  60 mcg Subcutaneous Q Thu-1800  . ferumoxytol  510 mg Intravenous 3 days  . furosemide  80 mg Intravenous TID  . insulin aspart  0-20 Units Subcutaneous TID WC  . insulin aspart  0-5 Units Subcutaneous QHS  . insulin glargine  50 Units Subcutaneous QHS  . polysaccharide iron  150 mg Oral Daily  . potassium chloride SA  20 mEq Oral TID  . sodium chloride  3 mL Intravenous Q12H  . spironolactone  12.5 mg Oral Once  . warfarin  7.5 mg Oral ONCE-1800  . warfarin  7.5 mg Oral ONCE-1800   No results found. BMET  Lab 05/23/11 0554 05/22/11 0500 05/21/11 0448 05/20/11 2045 05/20/11 1240  NA 142 138 139 140 137  K 3.3* 3.2* 3.6 3.9 3.6  CL 108 105 104 105 103  CO2 26 25 25 25 26   GLUCOSE 68* 153* 243* 306* 327*  BUN 28* 29* 32* 33* 31*  CREATININE 2.81* 2.69* 2.86* 2.71* 2.69*  ALB -- -- -- -- --  CALCIUM 8.9 9.0 9.1 8.9 8.8  PHOS -- -- -- -- --   CBC  Lab 05/24/11 0622 05/23/11 0554  05/22/11 0500 05/20/11 2045 05/20/11 1240  WBC 4.6 4.2 4.7 5.7 --  NEUTROABS -- -- -- 4.0 4.2  HGB 8.4* 8.1* 7.7* 7.9* --  HCT 26.2* 25.3* 24.0* 24.9* --  MCV 80.4 80.3 79.5 80.1 --  PLT 371 321 290 295 --     Assessment/Plan:  1. 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 Will continue temporary hold of ACE-I and monitor with labs for now. He will need out-patient renal follow up and suspect that with compliance lapses will eventually need some form of renal replacement therapy. Continues to show good response to diuresis with improved pedal edema and respiratory symptoms. Labs are pending from this morning and I will followup on the same. Clinically, he continues to do better. I again sensitized him on his chronic kidney disease and the need for outpatient followup as his risk for RRT is exceedingly high. He expresses understanding and concern. 2. CHF exacerbation VS CAP: on Diuretics and rocephin therapy- clinically improving.  3. Anemia of CKD: Low  iron stores noted- getting iron and aranesp  4. 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  5. Hypertension: likely elevated pressure from volume excess, monitor with diuretics and titrate amlodipine VS add B-blocker.  6. Hypokalemia: Due to kaliuresis from furosemide, Status post oral repletion and a dose of Aldactone. Will followup labs from today.    Izaak Sahr K.

## 2011-05-24 NOTE — Progress Notes (Signed)
Subjective: He feels okay today. He continues to have moderate bilateral leg edema. His appetite has been good and he has not had significant shortness of breath at rest. Is not having nausea. Objective: Vital signs in last 24 hours: Temp:  [97.4 F (36.3 C)-98.4 F (36.9 C)] 98.4 F (36.9 C) (12/23 0637) Pulse Rate:  [78-88] 84  (12/23 0848) Resp:  [18-20] 18  (12/23 0637) BP: (113-160)/(64-83) 146/64 mmHg (12/23 0848) SpO2:  [93 %-100 %] 97 % (12/23 0637) Weight:  [144.3 kg (318 lb 2 oz)] 318 lb 2 oz (144.3 kg) (12/23 [id number]) Weight change: -3.89 kg (-8 lb 9.2 oz)   Intake/Output from previous day: 12/22 0701 - 12/23 0700 In: [id number] [P.O.:840; I.V.:3] Out: 3425 [Urine:3425]   General appearance: alert, cooperative and no distress Resp: clear to auscultation bilaterally Cardio: regular rate and rhythm, S1, S2 normal, no murmur, click, rub or gallop Extremities: He has bilateral 3+ leg edema  Lab Results:  Basename 05/24/11 0622 05/23/11 0554  WBC 4.6 4.2  HGB 8.4* 8.1*  HCT 26.2* 25.3*  PLT 371 321   BMET  Basename 05/24/11 0945 05/23/11 0554  NA 138 142  K 3.6 3.3*  CL 103 108  CO2 26 26  GLUCOSE 150* 68*  BUN 28* 28*  CREATININE 2.86* 2.81*  CALCIUM 9.0 8.9   CMET CMP     Component Value Date/Time   NA 138 05/24/2011 0945   K 3.6 05/24/2011 0945   CL 103 05/24/2011 0945   CO2 26 05/24/2011 0945   GLUCOSE 150* 05/24/2011 0945   BUN 28* 05/24/2011 0945   CREATININE 2.86* 05/24/2011 0945   CREATININE 4.44* 01/14/2011 1428   CALCIUM 9.0 05/24/2011 0945   CALCIUM 7.8* 01/14/2011 1001   PROT 6.4 05/20/2011 2045   ALBUMIN 2.5* 05/20/2011 2045   AST 20 05/20/2011 2045   ALT 14 05/20/2011 2045   ALKPHOS 118* 05/20/2011 2045   BILITOT 0.3 05/20/2011 2045   GFRNONAA 23* 05/24/2011 0945   GFRAA 26* 05/24/2011 0945    CBG (last 3)   Basename 05/24/11 0751 05/23/11 2209 05/23/11 1711  GLUCAP 133* 155* 160*    INR RESULTS:   Lab Results  Component Value  Date   INR 2.10* 05/24/2011   INR 2.18* 05/23/2011   INR 2.18* 05/22/2011     Studies/Results: No results found.  Medications: I have reviewed the patient's current medications.  Assessment/Plan: #1 Diabetes Mellitus, Type II: Stable on current medications. #2 Anasarca: Most likely from nephrotic proteinuria and hypoalbuminemia. He continues to have a net -24 hour intake and out take results. We will initiate daily body weights as well. #3 Pneumonia: Stable on current meds. #4 Hypokalemia: Improved and most likely from high dose Lasix treatment. We will give another dose of spironolactone 25 mg today. We'll recheck electrolytes in the morning.  LOS: 4 days   Demonica Farrey G 05/24/2011, 11:54 AM

## 2011-05-24 NOTE — Progress Notes (Signed)
Aranesp/ANTICOAGULATION CONSULT NOTE - Follow Up Consult  Pharmacy Consult for Warfarin/Aranesp Indication:  Warfarin: Chronic coumadin for Hx DVT  Aranesp: Anemia  No Known Allergies  Patient Measurements: Height: 6\' 3"  (190.5 cm) Weight: 318 lb 2 oz (144.3 kg) IBW/kg (Calculated) : 84.5    Vital Signs: Temp: 98.4 F (36.9 C) (12/23 0637) Temp src: Oral (12/23 0637) BP: 118/69 mmHg (12/23 0637) Pulse Rate: 78  (12/23 0637)  Labs:  Basename 05/24/11 0622 05/23/11 0554 05/22/11 0500  HGB 8.4* 8.1* --  HCT 26.2* 25.3* 24.0*  PLT 371 321 290  APTT -- -- --  LABPROT 23.9* 24.6* 24.6*  INR 2.10* 2.18* 2.18*  HEPARINUNFRC -- -- --  CREATININE -- 2.81* 2.69*  CKTOTAL -- -- --  CKMB -- -- --  TROPONINI -- -- --   Estimated Creatinine Clearance: 43.9 ml/min (by C-G formula based on Cr of 2.81).   Medications:  Scheduled:    . amLODipine  5 mg Oral Daily  . azithromycin  500 mg Intravenous Q24H  . carvedilol  6.25 mg Oral BID WC  . cefTRIAXone (ROCEPHIN)  IV  1 g Intravenous Q24H  . darbepoetin (ARANESP) injection - NON-DIALYSIS  60 mcg Subcutaneous Q Thu-1800  . ferumoxytol  510 mg Intravenous 3 days  . furosemide  80 mg Intravenous TID  . insulin aspart  0-20 Units Subcutaneous TID WC  . insulin aspart  0-5 Units Subcutaneous QHS  . insulin glargine  50 Units Subcutaneous QHS  . polysaccharide iron  150 mg Oral Daily  . potassium chloride SA  20 mEq Oral TID  . sodium chloride  3 mL Intravenous Q12H  . spironolactone  12.5 mg Oral Once  . warfarin  7.5 mg Oral ONCE-1800  . DISCONTD: furosemide  80 mg Intravenous Q12H  . DISCONTD: potassium chloride SA  20 mEq Oral BID   Infusions:   PRN: sodium chloride, acetaminophen, acetaminophen, guaiFENesin-dextromethorphan, ondansetron (ZOFRAN) IV, sodium chloride  Assessment:  Anticoagulation: 59 yo M on chronic coumadin for DVT.  INR remains therapeutic at 2.10 on home dosing.  Home dose = 7.5 mg daily except 10 mg  on MWF.  CBC stable.  Potential drug interaction with azithromycin, however day 4 of abx tx and INR okay.  No bleeding/complications reported.   Aranesp: Dose 1/4 given -Aranesp 60 mcg SQ weekly.  Anemia likely secondary to chronic renal disease. Iron low as expected (25 ug/dl).  Hgb 8.4 (8.1). Pt. Also receiving Feraheme 510 mg IV Q72h and Niferex 150 mg po daily.     Goal of Therapy:  INR 2-3  Aranesp: Avoidance of transfusion; hold for Hgb > 10   Plan:   Continue with home dose coumadin - 7.5 mg x 1 tonight.  F/U AM PT/INR and CBC  Continue aranesp 60 mcg SQ weekly for up to 4 doses.  Rhian Funari, Gaye Alken PharmD 8:19 AM 05/24/2011

## 2011-05-25 LAB — BASIC METABOLIC PANEL
BUN: 31 mg/dL — ABNORMAL HIGH (ref 6–23)
CO2: 27 mEq/L (ref 19–32)
Calcium: 8.8 mg/dL (ref 8.4–10.5)
Chloride: 105 mEq/L (ref 96–112)
Creatinine, Ser: 3.24 mg/dL — ABNORMAL HIGH (ref 0.50–1.35)
GFR calc Af Amer: 23 mL/min — ABNORMAL LOW (ref >90)
GFR calc non Af Amer: 20 mL/min — ABNORMAL LOW (ref >90)
Glucose, Bld: 151 mg/dL — ABNORMAL HIGH (ref 70–99)
Potassium: 3.6 mEq/L (ref 3.5–5.1)
Sodium: 141 mEq/L (ref 135–145)

## 2011-05-25 LAB — CBC
HCT: 27.8 % — ABNORMAL LOW (ref 39.0–52.0)
Hemoglobin: 8.8 g/dL — ABNORMAL LOW (ref 13.0–17.0)
MCH: 25.7 pg — ABNORMAL LOW (ref 26.0–34.0)
MCHC: 31.7 g/dL (ref 30.0–36.0)
MCV: 81 fL (ref 78.0–100.0)
Platelets: 374 10*3/uL (ref 150–400)
RBC: 3.43 MIL/uL — ABNORMAL LOW (ref 4.22–5.81)
RDW: 16.9 % — ABNORMAL HIGH (ref 11.5–15.5)
WBC: 5.3 10*3/uL (ref 4.0–10.5)

## 2011-05-25 LAB — GLUCOSE, CAPILLARY
Glucose-Capillary: 166 mg/dL — ABNORMAL HIGH (ref 70–99)
Glucose-Capillary: 100 mg/dL — ABNORMAL HIGH (ref 70–99)
Glucose-Capillary: 97 mg/dL (ref 70–99)

## 2011-05-25 LAB — PROTIME-INR
INR: 1.93 — ABNORMAL HIGH (ref 0.00–1.49)
Prothrombin Time: 22.4 seconds — ABNORMAL HIGH (ref 11.6–15.2)

## 2011-05-25 MED ORDER — CEFDINIR 300 MG PO CAPS: 300.0000 mg | ORAL_CAPSULE | Freq: Two times a day (BID) | ORAL | Status: AC

## 2011-05-25 MED ORDER — AMLODIPINE BESYLATE 5 MG PO TABS: 5.0000 mg | ORAL_TABLET | Freq: Every day | ORAL | Status: DC

## 2011-05-25 MED ORDER — FUROSEMIDE 80 MG PO TABS: 80.0000 mg | ORAL_TABLET | Freq: Two times a day (BID) | ORAL | Status: DC

## 2011-05-25 MED ORDER — INSULIN GLARGINE 100 UNIT/ML ~~LOC~~ SOLN: 50.0000 [IU] | Freq: Every day | SUBCUTANEOUS | Status: DC

## 2011-05-25 MED ORDER — CARVEDILOL 6.25 MG PO TABS: 6.2500 mg | ORAL_TABLET | Freq: Two times a day (BID) | ORAL | Status: DC

## 2011-05-25 MED ORDER — WARFARIN SODIUM 10 MG PO TABS
10.0000 mg | ORAL_TABLET | Freq: Once | ORAL | Status: DC
  Filled 2011-05-25: qty 1

## 2011-05-25 NOTE — Progress Notes (Signed)
05/25/11 1419 Patient was given education handout. PIV was discontinued. Patient was transported by wheelchair to car at entrance of [facility]

## 2011-05-25 NOTE — Progress Notes (Signed)
Patient ID: [name], male   DOB: 23-Nov-1950, 60 y.o.   MRN: [id number]  Plans noted for the patient to be discharged today. I will have him followup in the office with me in 4-6 weeks with labs preceding this. Specifically renal panel magnesium and CBC. If acute indications noted for earlier followup, I would be happy to see him earlier. I have informed him that the clinic will be sending him an appointment reminder.  Elmarie Shiley MD [facility]. Office # [phone] Pager # [phone] 1:01 PM

## 2011-05-25 NOTE — Progress Notes (Signed)
[facility] consultation complete. [name] is appropriate for services and has agreed to participate.  [facility] will initiate services upon discharge with focus on medication procurement and disease process education.  For questions or additional referrals please contact [facility] Liaison.

## 2011-05-25 NOTE — Progress Notes (Signed)
05/25/2011 Mancil Pfenning,rn bsn ccm [id number] PT FOR D/C TODAY. ORDERS FOR HHRN, PT, OT , AIDE; WILL REQUIRE COUMADIN MANAGEMENT WITH BLOOD DRWS, ADVANCED NOTIFIED; pRIMARY CARE NURSE AWAITING CALL FROM [facility] TO GET SPECIFICS REGARDING BLOOD DRWS

## 2011-05-25 NOTE — Progress Notes (Signed)
ANTICOAGULATION CONSULT NOTE - Follow Up Consult  Pharmacy Consult for Warfarin Indication: Hx DVT  No Known Allergies  Patient Measurements: Height: 6\' 3"  (190.5 cm) Weight: 317 lb 10.9 oz (144.1 kg) IBW/kg (Calculated) : 84.5    Vital Signs: Temp: 98.6 F (37 C) (12/24 0635) Temp src: Oral (12/24 0635) BP: 152/84 mmHg (12/24 0635) Pulse Rate: 96  (12/24 0635)  Labs:  Basename 05/25/11 0445 05/24/11 0945 05/24/11 0622 05/23/11 0554  HGB 8.8* -- 8.4* --  HCT 27.8* -- 26.2* 25.3*  PLT 374 -- 371 321  APTT -- -- -- --  LABPROT 22.4* -- 23.9* 24.6*  INR 1.93* -- 2.10* 2.18*  HEPARINUNFRC -- -- -- --  CREATININE 3.24* 2.86* -- 2.81*  CKTOTAL -- -- -- --  CKMB -- -- -- --  TROPONINI -- -- -- --   Estimated Creatinine Clearance: 38.1 ml/min (by C-G formula based on Cr of 3.24).   Medications:  Scheduled:     . amLODipine  5 mg Oral Daily  . azithromycin  500 mg Intravenous Q24H  . carvedilol  6.25 mg Oral BID WC  . cefTRIAXone (ROCEPHIN)  IV  1 g Intravenous Q24H  . darbepoetin (ARANESP) injection - NON-DIALYSIS  60 mcg Subcutaneous Q Thu-1800  . ferumoxytol  510 mg Intravenous 3 days  . furosemide  80 mg Intravenous TID  . insulin aspart  0-20 Units Subcutaneous TID WC  . insulin aspart  0-5 Units Subcutaneous QHS  . insulin glargine  50 Units Subcutaneous QHS  . polysaccharide iron  150 mg Oral Daily  . potassium chloride SA  20 mEq Oral TID  . sodium chloride  3 mL Intravenous Q12H  . spironolactone  25 mg Oral Once  . warfarin  7.5 mg Oral ONCE-1800   Infusions:   PRN: sodium chloride, acetaminophen, acetaminophen, guaiFENesin-dextromethorphan, ondansetron (ZOFRAN) IV, sodium chloride  Patient's usual warfarin dosage is reportedly 10mg  on M,W,F; 7.5mg  on Tues, Thurs, Sat, Sun.  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  INR dropped just below therapeutic range today.   Goal of Therapy:  INR 2-3    Plan:   Boost with warfarin 10mg  PO  today as per patient's usual regimen.  Follow INR daily while inpatient.  Clayburn Pert, Pharm.D.  [id number] 05/25/2011 9:13 AM

## 2011-05-25 NOTE — Discharge Summary (Signed)
DISCHARGE SUMMARY  [name]  MR#: [id number]  DOB:11/25/1950  Date of Admission: 05/20/2011 Date of Discharge: 05/25/2011  Attending Physician:Deante Blough A  Patient's HW:[id number] A, MD, MD  Consults:Treatment Team:  Geoffery Lyons, MD Clayborne Dana. Posey Pronto, MD  Discharge Diagnoses: Active Problems: Bilateral pneumonia versus pulmonary edema  DIABETES MELLITUS-TYPE II- renal, or neurologic complications  Renal failure, CKd 3  Anemia of chronic disease  Chronic edema secondary to hypoalbuminemia and renal failure  Hypertension  Venous insufficiency   Discharge Medications: Current Discharge Medication List    START taking these medications   Details  amLODipine (NORVASC) 5 MG tablet Take 1 tablet (5 mg total) by mouth daily. Qty: 30 tablet, Refills: 6    carvedilol (COREG) 6.25 MG tablet Take 1 tablet (6.25 mg total) by mouth 2 (two) times daily with a meal. Qty: 60 tablet, Refills: 5    cefdinir (OMNICEF) 300 MG capsule Take 1 capsule (300 mg total) by mouth 2 (two) times daily. Qty: 14 capsule, Refills: 0    furosemide (LASIX) 80 MG tablet Take 1 tablet (80 mg total) by mouth 2 (two) times daily. Qty: 60 tablet, Refills: 5    insulin glargine (LANTUS) 100 UNIT/ML injection Inject 50 Units into the skin at bedtime. Qty: 10 mL, Refills: 12      CONTINUE these medications which have NOT CHANGED   Details  acetaminophen (TYLENOL) 500 MG tablet Take 1,000 mg by mouth every 6 (six) hours as needed. For headache     calcitRIOL (ROCALTROL) 0.25 MCG capsule Take 0.25 mcg by mouth daily.      insulin lispro (HUMALOG) 100 UNIT/ML injection Inject 10-75 Units into the skin 3 (three) times daily before meals. Inject 75 units every day in the morning and at lunch and inject 10 units at bedtime     polysaccharide iron (NIFEREX) 150 MG CAPS capsule Take 1 capsule (150 mg total) by mouth daily. Qty: 30 each, Refills: 2    potassium chloride SA (K-DUR,KLOR-CON)  20 MEQ tablet Take 1 tablet (20 mEq total) by mouth daily. Qty: 30 tablet, Refills: 1    warfarin (COUMADIN) 5 MG tablet Take 5 mg by mouth daily. Take one and one-half (7.5mg ) tablet by mouth on Sundays, Tuesdays, Thursdays, and Saturdays, then take two tablets (10mg ) on Mondays, Wednesdays, and Fridays or take as directed by coumadin clinic.      STOP taking these medications     amLODipine-benazepril (LOTREL) 5-20 MG per capsule      torsemide (DEMADEX) 10 MG tablet         Hospital Procedures: US Renal Port  05/20/2011  *RADIOLOGY REPORT*  Clinical Data: Renal failure.  Lymphoma.  Diabetes and hypertension  RENAL/URINARY TRACT ULTRASOUND COMPLETE  Comparison:  Ultrasound 01/13/2011  Findings:  Right Kidney:  11.7 cm.  Negative  Left Kidney:  12.6 cm.  Negative  Bladder:  Empty  IMPRESSION: Negative renal ultrasound.  Original Report Authenticated By: Truett Perna, [name]   Dg Chest Port 1v Same Day  05/20/2011  *RADIOLOGY REPORT*  Clinical Data: Cough, shortness of breath, history of lymphoma  PORTABLE CHEST - 1 VIEW SAME DAY  Comparison: 01/13/2011  Findings: Borderline cardiomegaly noted.  Bilateral central airspace disease noted with asymmetric appearance more prominent in the left upper lobe perihilar and right lower lobe infrahilar. This are suspicious for bilateral pneumonia rather than asymmetric pulmonary edema.  Clinical correlation is necessary.  IMPRESSION:  Bilateral central airspace disease noted with asymmetric appearance more prominent  in the left upper lobe perihilar and right lower lobe infrahilar.  This are suspicious for bilateral pneumonia rather than asymmetric pulmonary edema.  Clinical correlation is necessary.Follow-up to resolution is recommended.  Original Report Authenticated By: Lahoma Crocker, [name]    History of Present Illness: [name] is a long-standing patient of mine well known to the office with very difficult compliance largely financial. He has been out of  work and disabled since his diagnosis of lymphoma in the past. This has been treated and in remission. We have tried to put them in place with multiple resources but notoriously his diabetes is out of control, hypertension is out of control and has had progressive renal failure. He presented to the emergency room with progressive dyspnea, hypoxemia and bilateral pulmonary infiltrates-unclear as to whether this was infectious or fluid related. He was hospitalized for further management.  Hospital Course: Patient was hospitalized placed on IV diuretics, IV antibiotics and consultation obtained from both cardiology, and renal. He responded nicely to the extent that his O2 sats returned to be 96% range off of oxygen. Lung fields have cleared out nicely. He does remain with chronic lower Ebony Hail he swelling which will remain based upon his diabetic renal disease. He is ambulating without difficulty and tolerating a diet well. Diuretics and antibiotics referred to oral form. Dr. Debara Pickett did see him with cardiology and his left ventricular size and cavity were normal with EF noted to be 60-65% with no regional wall abnormalities. He was placed back on his Coumadin and this adjusted by pharmacy in keeping with his prior thromboembolic disease history of prior malignancy history. Discharge for further as an outpatient.  Day of Discharge Exam BP 152/84  Pulse 96  Temp(Src) 98.6 F (37 C) (Oral)  Resp 18  Ht 6\' 3"  (1.905 m)  Wt 144.1 kg (317 lb 10.9 oz)  BMI 39.71 kg/m2  SpO2 96%  Physical Exam: General appearance: alert, cooperative, appears stated age and no distress Eyes: no scleral icterus Throat: oropharynx moist without erythema Resp: clear to auscultation bilaterally Cardio: regular rate and rhythm, S1, S2 normal, no murmur, click, rub or gallop Extremities: no clubbing, cyanosis, he does have fairly extensive edema bilaterally with venous stasis changes approximately 3+. Neurologically he is awake  alert nonlateralizing higher cortical functioning is intact. Abdomen is soft nontender bowel sounds normal.  Discharge Labs:  [facility] 05/25/11 0445 05/24/11 0945  NA 141 138  K 3.6 3.6  CL 105 103  CO2 27 26  GLUCOSE 151* 150*  BUN 31* 28*  CREATININE 3.24* 2.86*  CALCIUM 8.8 9.0  MG -- --  PHOS -- --   No results found for this basename: AST:2,ALT:2,ALKPHOS:2,BILITOT:2,PROT:2,ALBUMIN:2 in the last 72 hours  Basename 05/25/11 0445 05/24/11 0622  WBC 5.3 4.6  NEUTROABS -- --  HGB 8.8* 8.4*  HCT 27.8* 26.2*  MCV 81.0 80.4  PLT 374 371   No results found for this basename: CKTOTAL:3,CKMB:3,CKMBINDEX:3,TROPONINI:3 in the last 72 hours No results found for this basename: TSH,T4TOTAL,FREET3,T3FREE,THYROIDAB in the last 72 hours No results found for this basename: VITAMINB12:2,FOLATE:2,FERRITIN:2,TIBC:2,IRON:2,RETICCTPCT:2 in the last 72 hours  Discharge instructions: Discharge Orders    Future Orders Please Complete By Expires   Compression stockings      Ambulatory referral to [facility]      Comments:   Please evaluate TARAY ALLEN for admission to [facility].  Disciplines requested: Nursing, Physical Therapy, Occupational Therapy and Medical Social Work  Services to provide: Evaluate  Physician to follow  patient's care (the person listed here will be responsible for signing ongoing orders): Referring Provider  Requested Start of Care Date: Tomorrow  Special Instructions:     Diet - low sodium heart healthy      Increase activity slowly         Disposition: Patient is discharged home home health in place.  Follow-up Appts: Follow-up with [facility] Reynaldo Minium [facility] in 1 week.  Call for appointment. He'll also be plugged into [facility] for followup as you will approach end-stage renal disease in the next few years I foresee.  Condition on Discharge: Stable  Tests Needing Follow-up: Dr. Shireen Quan office next week for protime  monitoring. Home health will be sending you nursing, PT, OT to assist with monitoring his fluid status, lab work, INR until he can make it to the office.  Signed: Betrice Wanat A 05/25/2011, 9:47 AM

## 2011-05-27 DIAGNOSIS — I509 Heart failure, unspecified: Secondary | ICD-10-CM | POA: Diagnosis not present

## 2011-05-27 DIAGNOSIS — C8589 Other specified types of non-Hodgkin lymphoma, extranodal and solid organ sites: Secondary | ICD-10-CM | POA: Diagnosis not present

## 2011-05-27 DIAGNOSIS — E11319 Type 2 diabetes mellitus with unspecified diabetic retinopathy without macular edema: Secondary | ICD-10-CM | POA: Diagnosis not present

## 2011-05-27 DIAGNOSIS — E1139 Type 2 diabetes mellitus with other diabetic ophthalmic complication: Secondary | ICD-10-CM | POA: Diagnosis not present

## 2011-05-27 DIAGNOSIS — N058 Unspecified nephritic syndrome with other morphologic changes: Secondary | ICD-10-CM | POA: Diagnosis not present

## 2011-05-27 DIAGNOSIS — E1129 Type 2 diabetes mellitus with other diabetic kidney complication: Secondary | ICD-10-CM | POA: Diagnosis not present

## 2011-05-30 ENCOUNTER — Telehealth: Payer: Self-pay | Admitting: Oncology

## 2011-05-30 DIAGNOSIS — I509 Heart failure, unspecified: Secondary | ICD-10-CM | POA: Diagnosis not present

## 2011-05-30 DIAGNOSIS — E1139 Type 2 diabetes mellitus with other diabetic ophthalmic complication: Secondary | ICD-10-CM | POA: Diagnosis not present

## 2011-05-30 DIAGNOSIS — C8589 Other specified types of non-Hodgkin lymphoma, extranodal and solid organ sites: Secondary | ICD-10-CM | POA: Diagnosis not present

## 2011-05-30 DIAGNOSIS — E11319 Type 2 diabetes mellitus with unspecified diabetic retinopathy without macular edema: Secondary | ICD-10-CM | POA: Diagnosis not present

## 2011-05-30 DIAGNOSIS — E1129 Type 2 diabetes mellitus with other diabetic kidney complication: Secondary | ICD-10-CM | POA: Diagnosis not present

## 2011-05-30 DIAGNOSIS — N058 Unspecified nephritic syndrome with other morphologic changes: Secondary | ICD-10-CM | POA: Diagnosis not present

## 2011-05-30 NOTE — Telephone Encounter (Signed)
S/w pt today re appts for 2/1 and 2/8 (mosiaq).

## 2011-06-01 DIAGNOSIS — E11319 Type 2 diabetes mellitus with unspecified diabetic retinopathy without macular edema: Secondary | ICD-10-CM | POA: Diagnosis not present

## 2011-06-01 DIAGNOSIS — E1139 Type 2 diabetes mellitus with other diabetic ophthalmic complication: Secondary | ICD-10-CM | POA: Diagnosis not present

## 2011-06-01 DIAGNOSIS — C8589 Other specified types of non-Hodgkin lymphoma, extranodal and solid organ sites: Secondary | ICD-10-CM | POA: Diagnosis not present

## 2011-06-01 DIAGNOSIS — E1129 Type 2 diabetes mellitus with other diabetic kidney complication: Secondary | ICD-10-CM | POA: Diagnosis not present

## 2011-06-01 DIAGNOSIS — N058 Unspecified nephritic syndrome with other morphologic changes: Secondary | ICD-10-CM | POA: Diagnosis not present

## 2011-06-01 DIAGNOSIS — I509 Heart failure, unspecified: Secondary | ICD-10-CM | POA: Diagnosis not present

## 2011-06-03 DIAGNOSIS — E1139 Type 2 diabetes mellitus with other diabetic ophthalmic complication: Secondary | ICD-10-CM | POA: Diagnosis not present

## 2011-06-03 DIAGNOSIS — C8589 Other specified types of non-Hodgkin lymphoma, extranodal and solid organ sites: Secondary | ICD-10-CM | POA: Diagnosis not present

## 2011-06-03 DIAGNOSIS — I509 Heart failure, unspecified: Secondary | ICD-10-CM | POA: Diagnosis not present

## 2011-06-03 DIAGNOSIS — N058 Unspecified nephritic syndrome with other morphologic changes: Secondary | ICD-10-CM | POA: Diagnosis not present

## 2011-06-03 DIAGNOSIS — E1129 Type 2 diabetes mellitus with other diabetic kidney complication: Secondary | ICD-10-CM | POA: Diagnosis not present

## 2011-06-03 DIAGNOSIS — E11319 Type 2 diabetes mellitus with unspecified diabetic retinopathy without macular edema: Secondary | ICD-10-CM | POA: Diagnosis not present

## 2011-06-08 DIAGNOSIS — N184 Chronic kidney disease, stage 4 (severe): Secondary | ICD-10-CM | POA: Diagnosis not present

## 2011-06-08 DIAGNOSIS — I82409 Acute embolism and thrombosis of unspecified deep veins of unspecified lower extremity: Secondary | ICD-10-CM | POA: Diagnosis not present

## 2011-06-08 DIAGNOSIS — E1129 Type 2 diabetes mellitus with other diabetic kidney complication: Secondary | ICD-10-CM | POA: Diagnosis not present

## 2011-06-08 DIAGNOSIS — Z7901 Long term (current) use of anticoagulants: Secondary | ICD-10-CM | POA: Diagnosis not present

## 2011-06-08 DIAGNOSIS — I10 Essential (primary) hypertension: Secondary | ICD-10-CM | POA: Diagnosis not present

## 2011-06-09 DIAGNOSIS — E1129 Type 2 diabetes mellitus with other diabetic kidney complication: Secondary | ICD-10-CM | POA: Diagnosis not present

## 2011-06-09 DIAGNOSIS — E1139 Type 2 diabetes mellitus with other diabetic ophthalmic complication: Secondary | ICD-10-CM | POA: Diagnosis not present

## 2011-06-09 DIAGNOSIS — I509 Heart failure, unspecified: Secondary | ICD-10-CM | POA: Diagnosis not present

## 2011-06-09 DIAGNOSIS — N058 Unspecified nephritic syndrome with other morphologic changes: Secondary | ICD-10-CM | POA: Diagnosis not present

## 2011-06-09 DIAGNOSIS — C8589 Other specified types of non-Hodgkin lymphoma, extranodal and solid organ sites: Secondary | ICD-10-CM | POA: Diagnosis not present

## 2011-06-09 DIAGNOSIS — E11319 Type 2 diabetes mellitus with unspecified diabetic retinopathy without macular edema: Secondary | ICD-10-CM | POA: Diagnosis not present

## 2011-06-16 DIAGNOSIS — C8589 Other specified types of non-Hodgkin lymphoma, extranodal and solid organ sites: Secondary | ICD-10-CM | POA: Diagnosis not present

## 2011-06-16 DIAGNOSIS — E11319 Type 2 diabetes mellitus with unspecified diabetic retinopathy without macular edema: Secondary | ICD-10-CM | POA: Diagnosis not present

## 2011-06-16 DIAGNOSIS — N058 Unspecified nephritic syndrome with other morphologic changes: Secondary | ICD-10-CM | POA: Diagnosis not present

## 2011-06-16 DIAGNOSIS — I509 Heart failure, unspecified: Secondary | ICD-10-CM | POA: Diagnosis not present

## 2011-06-16 DIAGNOSIS — E1129 Type 2 diabetes mellitus with other diabetic kidney complication: Secondary | ICD-10-CM | POA: Diagnosis not present

## 2011-06-16 DIAGNOSIS — E1139 Type 2 diabetes mellitus with other diabetic ophthalmic complication: Secondary | ICD-10-CM | POA: Diagnosis not present

## 2011-06-29 ENCOUNTER — Encounter: Payer: Self-pay | Admitting: *Deleted

## 2011-06-30 DIAGNOSIS — I129 Hypertensive chronic kidney disease with stage 1 through stage 4 chronic kidney disease, or unspecified chronic kidney disease: Secondary | ICD-10-CM | POA: Diagnosis not present

## 2011-06-30 DIAGNOSIS — R609 Edema, unspecified: Secondary | ICD-10-CM | POA: Diagnosis not present

## 2011-06-30 DIAGNOSIS — D649 Anemia, unspecified: Secondary | ICD-10-CM | POA: Diagnosis not present

## 2011-06-30 DIAGNOSIS — N184 Chronic kidney disease, stage 4 (severe): Secondary | ICD-10-CM | POA: Diagnosis not present

## 2011-06-30 DIAGNOSIS — N2581 Secondary hyperparathyroidism of renal origin: Secondary | ICD-10-CM | POA: Diagnosis not present

## 2011-07-01 DIAGNOSIS — N058 Unspecified nephritic syndrome with other morphologic changes: Secondary | ICD-10-CM | POA: Diagnosis not present

## 2011-07-01 DIAGNOSIS — I509 Heart failure, unspecified: Secondary | ICD-10-CM | POA: Diagnosis not present

## 2011-07-01 DIAGNOSIS — E1129 Type 2 diabetes mellitus with other diabetic kidney complication: Secondary | ICD-10-CM | POA: Diagnosis not present

## 2011-07-01 DIAGNOSIS — C8589 Other specified types of non-Hodgkin lymphoma, extranodal and solid organ sites: Secondary | ICD-10-CM | POA: Diagnosis not present

## 2011-07-01 DIAGNOSIS — E1139 Type 2 diabetes mellitus with other diabetic ophthalmic complication: Secondary | ICD-10-CM | POA: Diagnosis not present

## 2011-07-01 DIAGNOSIS — E11319 Type 2 diabetes mellitus with unspecified diabetic retinopathy without macular edema: Secondary | ICD-10-CM | POA: Diagnosis not present

## 2011-07-03 ENCOUNTER — Other Ambulatory Visit: Payer: Medicare Other | Admitting: Lab

## 2011-07-08 DIAGNOSIS — I509 Heart failure, unspecified: Secondary | ICD-10-CM | POA: Diagnosis not present

## 2011-07-08 DIAGNOSIS — N058 Unspecified nephritic syndrome with other morphologic changes: Secondary | ICD-10-CM | POA: Diagnosis not present

## 2011-07-08 DIAGNOSIS — E1129 Type 2 diabetes mellitus with other diabetic kidney complication: Secondary | ICD-10-CM | POA: Diagnosis not present

## 2011-07-08 DIAGNOSIS — E11319 Type 2 diabetes mellitus with unspecified diabetic retinopathy without macular edema: Secondary | ICD-10-CM | POA: Diagnosis not present

## 2011-07-08 DIAGNOSIS — E1139 Type 2 diabetes mellitus with other diabetic ophthalmic complication: Secondary | ICD-10-CM | POA: Diagnosis not present

## 2011-07-08 DIAGNOSIS — C8589 Other specified types of non-Hodgkin lymphoma, extranodal and solid organ sites: Secondary | ICD-10-CM | POA: Diagnosis not present

## 2011-07-10 ENCOUNTER — Ambulatory Visit: Payer: Medicare Other | Admitting: Oncology

## 2011-07-15 DIAGNOSIS — E1139 Type 2 diabetes mellitus with other diabetic ophthalmic complication: Secondary | ICD-10-CM | POA: Diagnosis not present

## 2011-07-15 DIAGNOSIS — N058 Unspecified nephritic syndrome with other morphologic changes: Secondary | ICD-10-CM | POA: Diagnosis not present

## 2011-07-15 DIAGNOSIS — E1129 Type 2 diabetes mellitus with other diabetic kidney complication: Secondary | ICD-10-CM | POA: Diagnosis not present

## 2011-07-15 DIAGNOSIS — C8589 Other specified types of non-Hodgkin lymphoma, extranodal and solid organ sites: Secondary | ICD-10-CM | POA: Diagnosis not present

## 2011-07-15 DIAGNOSIS — I509 Heart failure, unspecified: Secondary | ICD-10-CM | POA: Diagnosis not present

## 2011-07-15 DIAGNOSIS — E11319 Type 2 diabetes mellitus with unspecified diabetic retinopathy without macular edema: Secondary | ICD-10-CM | POA: Diagnosis not present

## 2011-07-22 DIAGNOSIS — I509 Heart failure, unspecified: Secondary | ICD-10-CM | POA: Diagnosis not present

## 2011-07-22 DIAGNOSIS — E1129 Type 2 diabetes mellitus with other diabetic kidney complication: Secondary | ICD-10-CM | POA: Diagnosis not present

## 2011-07-22 DIAGNOSIS — C8589 Other specified types of non-Hodgkin lymphoma, extranodal and solid organ sites: Secondary | ICD-10-CM | POA: Diagnosis not present

## 2011-07-22 DIAGNOSIS — E1139 Type 2 diabetes mellitus with other diabetic ophthalmic complication: Secondary | ICD-10-CM | POA: Diagnosis not present

## 2011-07-22 DIAGNOSIS — N058 Unspecified nephritic syndrome with other morphologic changes: Secondary | ICD-10-CM | POA: Diagnosis not present

## 2011-07-22 DIAGNOSIS — E11319 Type 2 diabetes mellitus with unspecified diabetic retinopathy without macular edema: Secondary | ICD-10-CM | POA: Diagnosis not present

## 2011-07-29 DIAGNOSIS — Z7901 Long term (current) use of anticoagulants: Secondary | ICD-10-CM | POA: Diagnosis not present

## 2011-07-29 DIAGNOSIS — I82409 Acute embolism and thrombosis of unspecified deep veins of unspecified lower extremity: Secondary | ICD-10-CM | POA: Diagnosis not present

## 2011-08-05 DIAGNOSIS — E11349 Type 2 diabetes mellitus with severe nonproliferative diabetic retinopathy without macular edema: Secondary | ICD-10-CM | POA: Diagnosis not present

## 2011-08-05 DIAGNOSIS — E119 Type 2 diabetes mellitus without complications: Secondary | ICD-10-CM | POA: Diagnosis not present

## 2011-08-05 DIAGNOSIS — H52229 Regular astigmatism, unspecified eye: Secondary | ICD-10-CM | POA: Diagnosis not present

## 2011-08-05 DIAGNOSIS — H524 Presbyopia: Secondary | ICD-10-CM | POA: Diagnosis not present

## 2011-08-17 ENCOUNTER — Encounter (INDEPENDENT_AMBULATORY_CARE_PROVIDER_SITE_OTHER): Payer: Medicare Other | Admitting: Ophthalmology

## 2011-08-17 DIAGNOSIS — E1139 Type 2 diabetes mellitus with other diabetic ophthalmic complication: Secondary | ICD-10-CM

## 2011-08-17 DIAGNOSIS — H2510 Age-related nuclear cataract, unspecified eye: Secondary | ICD-10-CM

## 2011-08-17 DIAGNOSIS — E11359 Type 2 diabetes mellitus with proliferative diabetic retinopathy without macular edema: Secondary | ICD-10-CM | POA: Diagnosis not present

## 2011-08-17 DIAGNOSIS — H4310 Vitreous hemorrhage, unspecified eye: Secondary | ICD-10-CM | POA: Diagnosis not present

## 2011-08-17 DIAGNOSIS — H43819 Vitreous degeneration, unspecified eye: Secondary | ICD-10-CM | POA: Diagnosis not present

## 2011-08-17 DIAGNOSIS — E1165 Type 2 diabetes mellitus with hyperglycemia: Secondary | ICD-10-CM | POA: Diagnosis not present

## 2011-08-18 ENCOUNTER — Other Ambulatory Visit (INDEPENDENT_AMBULATORY_CARE_PROVIDER_SITE_OTHER): Payer: Self-pay | Admitting: Ophthalmology

## 2011-08-18 DIAGNOSIS — H3342 Traction detachment of retina, left eye: Secondary | ICD-10-CM

## 2011-08-18 DIAGNOSIS — E113599 Type 2 diabetes mellitus with proliferative diabetic retinopathy without macular edema, unspecified eye: Secondary | ICD-10-CM

## 2011-08-18 NOTE — H&P (Signed)
[name] is an 61 y.o. male.   Chief Complaint:Severe loss of vision left eye HPI: Longstanding diabetic with proliferative diabetic retinopathy left eye.  Also traction retinal detachment  Past Medical History  Diagnosis Date  . Cancer   . Diabetes mellitus   . DVT (deep venous thrombosis)   . Diabetic nephropathy   . Diabetic retinopathy   . Hypertension   . Hyperlipidemia   . Non Hodgkin's lymphoma     Tx 2009  . History of cardiac catheterization   . Nodular lymphoma of intra-abdominal lymph nodes     No past surgical history on file.  No family history on file. Social History:  reports that he has never smoked. He has never used smokeless tobacco. He reports that he does not drink alcohol or use illicit drugs.  Allergies: No Known Allergies  No current facility-administered medications on file as of .   Medications Prior to Admission  Medication Sig Dispense Refill  . acetaminophen (TYLENOL) 500 MG tablet Take 1,000 mg by mouth every 6 (six) hours as needed. For headache       . amLODipine (NORVASC) 5 MG tablet Take 1 tablet (5 mg total) by mouth daily.  30 tablet  6  . calcitRIOL (ROCALTROL) 0.25 MCG capsule Take 0.25 mcg by mouth daily.        . carvedilol (COREG) 6.25 MG tablet Take 1 tablet (6.25 mg total) by mouth 2 (two) times daily with a meal.  60 tablet  5  . furosemide (LASIX) 80 MG tablet Take 1 tablet (80 mg total) by mouth 2 (two) times daily.  60 tablet  5  . insulin glargine (LANTUS) 100 UNIT/ML injection Inject 50 Units into the skin at bedtime.  10 mL  12  . insulin lispro (HUMALOG) 100 UNIT/ML injection Inject 10-75 Units into the skin 3 (three) times daily before meals. Inject 75 units every day in the morning and at lunch and inject 10 units at bedtime       . polysaccharide iron (NIFEREX) 150 MG CAPS capsule Take 1 capsule (150 mg total) by mouth daily.  30 each  2  . potassium chloride SA (K-DUR,KLOR-CON) 20 MEQ tablet Take 1 tablet (20 mEq total)  by mouth daily.  30 tablet  1  . warfarin (COUMADIN) 5 MG tablet Take 5 mg by mouth daily. Take one and one-half (7.5mg ) tablet by mouth on Sundays, Tuesdays, Thursdays, and Saturdays, then take two tablets (10mg ) on Mondays, Wednesdays, and Fridays or take as directed by coumadin clinic.        Review of systems otherwise negative  There were no vitals taken for this visit.  Physical exam: Mental status: oriented x3. Eyes: See eye exam for this surgery on this date in media tab.  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  Hayden Pedro 08/18/2011, 7:35 AM

## 2011-08-20 ENCOUNTER — Encounter (HOSPITAL_COMMUNITY): Payer: Self-pay | Admitting: Pharmacy Technician

## 2011-08-25 ENCOUNTER — Other Ambulatory Visit (HOSPITAL_COMMUNITY): Payer: Self-pay | Admitting: *Deleted

## 2011-08-25 ENCOUNTER — Encounter (HOSPITAL_COMMUNITY): Payer: Self-pay | Admitting: *Deleted

## 2011-08-26 MED ORDER — TROPICAMIDE 1 % OP SOLN
1.0000 [drp] | OPHTHALMIC | Status: AC | PRN
  Administered 2011-08-27 (×3): 1 [drp] via OPHTHALMIC
  Filled 2011-08-26: qty 3

## 2011-08-26 MED ORDER — CEFAZOLIN SODIUM-DEXTROSE 2-3 GM-% IV SOLR
2.0000 g | INTRAVENOUS | Status: AC
  Administered 2011-08-27: 2 g via INTRAVENOUS
  Filled 2011-08-26: qty 50

## 2011-08-26 MED ORDER — PHENYLEPHRINE HCL 2.5 % OP SOLN
1.0000 [drp] | OPHTHALMIC | Status: AC | PRN
  Administered 2011-08-27 (×3): 1 [drp] via OPHTHALMIC
  Filled 2011-08-26: qty 3

## 2011-08-26 MED ORDER — CYCLOPENTOLATE HCL 1 % OP SOLN
1.0000 [drp] | OPHTHALMIC | Status: AC | PRN
  Administered 2011-08-27 (×3): 1 [drp] via OPHTHALMIC
  Filled 2011-08-26: qty 2

## 2011-08-26 MED ORDER — GATIFLOXACIN 0.5 % OP SOLN
1.0000 [drp] | OPHTHALMIC | Status: AC | PRN
  Administered 2011-08-27 (×3): 1 [drp] via OPHTHALMIC
  Filled 2011-08-26: qty 2.5

## 2011-08-27 ENCOUNTER — Ambulatory Visit (HOSPITAL_COMMUNITY): Payer: Medicare Other | Admitting: Vascular Surgery

## 2011-08-27 ENCOUNTER — Encounter (HOSPITAL_COMMUNITY)
Admission: RE | Disposition: A | Discharge status: Home / Self Care | Payer: Self-pay | Source: Ambulatory Visit | Attending: Ophthalmology

## 2011-08-27 ENCOUNTER — Ambulatory Visit (HOSPITAL_COMMUNITY)
Admission: RE | Admit: 2011-08-27 | Discharge: 2011-08-28 | Disposition: A | Discharge status: Home / Self Care | Payer: Medicare Other | Source: Ambulatory Visit | Attending: Ophthalmology | Admitting: Ophthalmology

## 2011-08-27 ENCOUNTER — Ambulatory Visit (HOSPITAL_COMMUNITY): Payer: Medicare Other

## 2011-08-27 ENCOUNTER — Encounter (HOSPITAL_COMMUNITY): Payer: Self-pay | Admitting: Vascular Surgery

## 2011-08-27 ENCOUNTER — Encounter (HOSPITAL_COMMUNITY): Payer: Self-pay | Admitting: *Deleted

## 2011-08-27 DIAGNOSIS — H3340 Traction detachment of retina, unspecified eye: Secondary | ICD-10-CM | POA: Diagnosis not present

## 2011-08-27 DIAGNOSIS — Z01811 Encounter for preprocedural respiratory examination: Secondary | ICD-10-CM | POA: Diagnosis not present

## 2011-08-27 DIAGNOSIS — Z794 Long term (current) use of insulin: Secondary | ICD-10-CM | POA: Insufficient documentation

## 2011-08-27 DIAGNOSIS — I10 Essential (primary) hypertension: Secondary | ICD-10-CM | POA: Diagnosis not present

## 2011-08-27 DIAGNOSIS — H4310 Vitreous hemorrhage, unspecified eye: Secondary | ICD-10-CM | POA: Insufficient documentation

## 2011-08-27 DIAGNOSIS — H35379 Puckering of macula, unspecified eye: Secondary | ICD-10-CM | POA: Diagnosis not present

## 2011-08-27 DIAGNOSIS — E1139 Type 2 diabetes mellitus with other diabetic ophthalmic complication: Secondary | ICD-10-CM | POA: Insufficient documentation

## 2011-08-27 DIAGNOSIS — E1129 Type 2 diabetes mellitus with other diabetic kidney complication: Secondary | ICD-10-CM | POA: Insufficient documentation

## 2011-08-27 DIAGNOSIS — K08409 Partial loss of teeth, unspecified cause, unspecified class: Secondary | ICD-10-CM | POA: Insufficient documentation

## 2011-08-27 DIAGNOSIS — E11359 Type 2 diabetes mellitus with proliferative diabetic retinopathy without macular edema: Secondary | ICD-10-CM

## 2011-08-27 DIAGNOSIS — R0602 Shortness of breath: Secondary | ICD-10-CM | POA: Insufficient documentation

## 2011-08-27 DIAGNOSIS — N058 Unspecified nephritic syndrome with other morphologic changes: Secondary | ICD-10-CM | POA: Insufficient documentation

## 2011-08-27 DIAGNOSIS — E113599 Type 2 diabetes mellitus with proliferative diabetic retinopathy without macular edema, unspecified eye: Secondary | ICD-10-CM

## 2011-08-27 DIAGNOSIS — R51 Headache: Secondary | ICD-10-CM | POA: Insufficient documentation

## 2011-08-27 DIAGNOSIS — Z87898 Personal history of other specified conditions: Secondary | ICD-10-CM | POA: Diagnosis not present

## 2011-08-27 DIAGNOSIS — H33009 Unspecified retinal detachment with retinal break, unspecified eye: Secondary | ICD-10-CM | POA: Diagnosis not present

## 2011-08-27 DIAGNOSIS — H3342 Traction detachment of retina, left eye: Secondary | ICD-10-CM

## 2011-08-27 DIAGNOSIS — E1165 Type 2 diabetes mellitus with hyperglycemia: Secondary | ICD-10-CM | POA: Diagnosis not present

## 2011-08-27 HISTORY — PX: PARS PLANA VITRECTOMY: SHX2166

## 2011-08-27 HISTORY — DX: Reserved for inherently not codable concepts without codable children: IMO0001

## 2011-08-27 HISTORY — DX: Peripheral vascular disease, unspecified: I73.9

## 2011-08-27 HISTORY — DX: Encounter for other specified aftercare: Z51.89

## 2011-08-27 HISTORY — DX: Headache: R51

## 2011-08-27 HISTORY — DX: Sleep apnea, unspecified: G47.30

## 2011-08-27 LAB — SURGICAL PCR SCREEN
MRSA, PCR: NEGATIVE
Staphylococcus aureus: NEGATIVE

## 2011-08-27 LAB — GLUCOSE, CAPILLARY
Glucose-Capillary: 146 mg/dL — ABNORMAL HIGH (ref 70–99)
Glucose-Capillary: 146 mg/dL — ABNORMAL HIGH (ref 70–99)
Glucose-Capillary: 163 mg/dL — ABNORMAL HIGH (ref 70–99)
Glucose-Capillary: 165 mg/dL — ABNORMAL HIGH (ref 70–99)
Glucose-Capillary: 186 mg/dL — ABNORMAL HIGH (ref 70–99)
Glucose-Capillary: 277 mg/dL — ABNORMAL HIGH (ref 70–99)
Glucose-Capillary: 353 mg/dL — ABNORMAL HIGH (ref 70–99)
Glucose-Capillary: 403 mg/dL — ABNORMAL HIGH (ref 70–99)
Glucose-Capillary: 423 mg/dL — ABNORMAL HIGH (ref 70–99)

## 2011-08-27 LAB — BASIC METABOLIC PANEL
BUN: 76 mg/dL — ABNORMAL HIGH (ref 6–23)
CO2: 25 mEq/L (ref 19–32)
Calcium: 9.2 mg/dL (ref 8.4–10.5)
Chloride: 104 mEq/L (ref 96–112)
Creatinine, Ser: 3.15 mg/dL — ABNORMAL HIGH (ref 0.50–1.35)
GFR calc Af Amer: 23 mL/min — ABNORMAL LOW (ref >90)
GFR calc non Af Amer: 20 mL/min — ABNORMAL LOW (ref >90)
Glucose, Bld: 172 mg/dL — ABNORMAL HIGH (ref 70–99)
Potassium: 4.5 mEq/L (ref 3.5–5.1)
Sodium: 142 mEq/L (ref 135–145)

## 2011-08-27 SURGERY — PARS PLANA VITRECTOMY WITH 25 GAUGE
Anesthesia: General | Site: Eye | Laterality: Left | Wound class: Clean

## 2011-08-27 MED ORDER — BUPIVACAINE HCL 0.75 % IJ SOLN
INTRAMUSCULAR | Status: DC | PRN
  Administered 2011-08-27: 10 mL

## 2011-08-27 MED ORDER — MUPIROCIN 2 % EX OINT
TOPICAL_OINTMENT | CUTANEOUS | Status: AC
  Administered 2011-08-27: 1
  Filled 2011-08-27: qty 22

## 2011-08-27 MED ORDER — ACETAZOLAMIDE SODIUM 500 MG IJ SOLR
500.0000 mg | Freq: Once | INTRAMUSCULAR | Status: AC
  Administered 2011-08-28: 500 mg via INTRAVENOUS
  Filled 2011-08-27: qty 500

## 2011-08-27 MED ORDER — DEXAMETHASONE SODIUM PHOSPHATE 10 MG/ML IJ SOLN
INTRAMUSCULAR | Status: DC | PRN
  Administered 2011-08-27: 10 mg

## 2011-08-27 MED ORDER — NEOSTIGMINE METHYLSULFATE 1 MG/ML IJ SOLN
INTRAMUSCULAR | Status: DC | PRN
  Administered 2011-08-27: 5 mg via INTRAVENOUS

## 2011-08-27 MED ORDER — INSULIN GLARGINE 100 UNIT/ML ~~LOC~~ SOLN
50.0000 [IU] | Freq: Once | SUBCUTANEOUS | Status: AC
  Administered 2011-08-27: 50 [IU] via SUBCUTANEOUS

## 2011-08-27 MED ORDER — SODIUM CHLORIDE 0.45 % IV SOLN: INTRAVENOUS | Status: DC

## 2011-08-27 MED ORDER — INSULIN REGULAR BOLUS VIA INFUSION
0.0000 [IU] | Freq: Three times a day (TID) | INTRAVENOUS | Status: DC
  Filled 2011-08-27: qty 10

## 2011-08-27 MED ORDER — EPINEPHRINE HCL 1 MG/ML IJ SOLN
INTRAOCULAR | Status: DC | PRN
  Administered 2011-08-27: 12:00:00

## 2011-08-27 MED ORDER — ONDANSETRON HCL 4 MG/2ML IJ SOLN: 4.0000 mg | Freq: Four times a day (QID) | INTRAMUSCULAR | Status: DC | PRN

## 2011-08-27 MED ORDER — SODIUM CHLORIDE 0.9 % IV SOLN: INTRAVENOUS | Status: DC

## 2011-08-27 MED ORDER — MINERAL OIL LIGHT 100 % EX OIL
TOPICAL_OIL | CUTANEOUS | Status: DC | PRN
  Administered 2011-08-27: 1 via TOPICAL

## 2011-08-27 MED ORDER — OXYCODONE-ACETAMINOPHEN 5-325 MG PO TABS
1.0000 | ORAL_TABLET | ORAL | Status: DC | PRN
  Administered 2011-08-27: 2 via ORAL
  Filled 2011-08-27: qty 2

## 2011-08-27 MED ORDER — LATANOPROST 0.005 % OP SOLN
1.0000 [drp] | Freq: Every day | OPHTHALMIC | Status: DC
  Filled 2011-08-27: qty 2.5

## 2011-08-27 MED ORDER — BACITRACIN-POLYMYXIN B 500-10000 UNIT/GM OP OINT
TOPICAL_OINTMENT | OPHTHALMIC | Status: DC | PRN
  Administered 2011-08-27: 1 via OPHTHALMIC

## 2011-08-27 MED ORDER — FENTANYL CITRATE 0.05 MG/ML IJ SOLN
INTRAMUSCULAR | Status: DC | PRN
  Administered 2011-08-27: 100 ug via INTRAVENOUS

## 2011-08-27 MED ORDER — ATROPINE SULFATE 1 % OP SOLN
OPHTHALMIC | Status: DC | PRN
  Administered 2011-08-27: 2 [drp] via OPHTHALMIC

## 2011-08-27 MED ORDER — PREDNISOLONE ACETATE 1 % OP SUSP
1.0000 [drp] | Freq: Four times a day (QID) | OPHTHALMIC | Status: DC
  Administered 2011-08-28: 1 [drp] via OPHTHALMIC
  Filled 2011-08-27: qty 1

## 2011-08-27 MED ORDER — BSS PLUS IO SOLN
INTRAOCULAR | Status: DC | PRN
  Administered 2011-08-27: 1 via OPHTHALMIC

## 2011-08-27 MED ORDER — ONDANSETRON HCL 4 MG/2ML IJ SOLN
INTRAMUSCULAR | Status: DC | PRN
  Administered 2011-08-27: 4 mg via INTRAVENOUS

## 2011-08-27 MED ORDER — SODIUM HYALURONATE 10 MG/ML IO SOLN
INTRAOCULAR | Status: DC | PRN
  Administered 2011-08-27: 0.85 mL via INTRAOCULAR

## 2011-08-27 MED ORDER — BACITRACIN-POLYMYXIN B 500-10000 UNIT/GM OP OINT
1.0000 "application " | TOPICAL_OINTMENT | Freq: Four times a day (QID) | OPHTHALMIC | Status: DC
  Administered 2011-08-28: 1 via OPHTHALMIC
  Filled 2011-08-27: qty 3.5

## 2011-08-27 MED ORDER — SODIUM CHLORIDE 0.9 % IJ SOLN
INTRAMUSCULAR | Status: DC | PRN
  Administered 2011-08-27: 12:00:00

## 2011-08-27 MED ORDER — TEMAZEPAM 15 MG PO CAPS: 15.0000 mg | ORAL_CAPSULE | Freq: Every evening | ORAL | Status: DC | PRN

## 2011-08-27 MED ORDER — SODIUM CHLORIDE 0.9 % IV SOLN
INTRAVENOUS | Status: DC
  Administered 2011-08-27: 12:00:00 via INTRAVENOUS

## 2011-08-27 MED ORDER — GATIFLOXACIN 0.5 % OP SOLN
1.0000 [drp] | Freq: Four times a day (QID) | OPHTHALMIC | Status: DC
  Administered 2011-08-28: 1 [drp] via OPHTHALMIC
  Filled 2011-08-27: qty 2.5

## 2011-08-27 MED ORDER — TETRACAINE HCL 0.5 % OP SOLN
2.0000 [drp] | Freq: Once | OPHTHALMIC | Status: DC
  Filled 2011-08-27: qty 2

## 2011-08-27 MED ORDER — SUCCINYLCHOLINE CHLORIDE 20 MG/ML IJ SOLN
INTRAMUSCULAR | Status: DC | PRN
  Administered 2011-08-27: 120 mg via INTRAVENOUS

## 2011-08-27 MED ORDER — MORPHINE SULFATE 2 MG/ML IJ SOLN: 1.0000 mg | INTRAMUSCULAR | Status: DC | PRN

## 2011-08-27 MED ORDER — BRIMONIDINE TARTRATE 0.2 % OP SOLN
1.0000 [drp] | Freq: Two times a day (BID) | OPHTHALMIC | Status: DC
  Administered 2011-08-28: 1 [drp] via OPHTHALMIC
  Filled 2011-08-27: qty 5

## 2011-08-27 MED ORDER — GLYCOPYRROLATE 0.2 MG/ML IJ SOLN
INTRAMUSCULAR | Status: DC | PRN
  Administered 2011-08-27: .7 mg via INTRAVENOUS

## 2011-08-27 MED ORDER — PROPOFOL 10 MG/ML IV EMUL
INTRAVENOUS | Status: DC | PRN
  Administered 2011-08-27: 200 mg via INTRAVENOUS

## 2011-08-27 MED ORDER — HYDROMORPHONE HCL PF 1 MG/ML IJ SOLN: 0.2500 mg | INTRAMUSCULAR | Status: DC | PRN

## 2011-08-27 MED ORDER — DEXTROSE-NACL 5-0.45 % IV SOLN
INTRAVENOUS | Status: DC
  Filled 2011-08-27: qty 1000

## 2011-08-27 MED ORDER — INSULIN ASPART 100 UNIT/ML ~~LOC~~ SOLN
10.0000 [IU] | Freq: Once | SUBCUTANEOUS | Status: AC
  Administered 2011-08-27: 10 [IU] via SUBCUTANEOUS

## 2011-08-27 MED ORDER — SODIUM CHLORIDE 0.45 % IV SOLN
INTRAVENOUS | Status: DC
  Administered 2011-08-27: 23:00:00 via INTRAVENOUS

## 2011-08-27 MED ORDER — LACTATED RINGERS IV SOLN: INTRAVENOUS | Status: DC

## 2011-08-27 MED ORDER — DEXTROSE 50 % IV SOLN: 25.0000 mL | INTRAVENOUS | Status: DC | PRN

## 2011-08-27 MED ORDER — ACETAMINOPHEN 325 MG PO TABS: 325.0000 mg | ORAL_TABLET | ORAL | Status: DC | PRN

## 2011-08-27 MED ORDER — SODIUM CHLORIDE 0.9 % IV SOLN
INTRAVENOUS | Status: DC | PRN
  Administered 2011-08-27: 13:00:00 via INTRAVENOUS

## 2011-08-27 MED ORDER — SODIUM CHLORIDE 0.9 % IV SOLN
INTRAVENOUS | Status: DC
  Administered 2011-08-27: 23:00:00 via INTRAVENOUS
  Filled 2011-08-27: qty 1

## 2011-08-27 MED ORDER — ROCURONIUM BROMIDE 100 MG/10ML IV SOLN
INTRAVENOUS | Status: DC | PRN
  Administered 2011-08-27: 30 mg via INTRAVENOUS

## 2011-08-27 MED ORDER — MAGNESIUM HYDROXIDE 400 MG/5ML PO SUSP: 15.0000 mL | Freq: Four times a day (QID) | ORAL | Status: DC | PRN

## 2011-08-27 MED ORDER — MIDAZOLAM HCL 5 MG/5ML IJ SOLN
INTRAMUSCULAR | Status: DC | PRN
  Administered 2011-08-27: 1 mg via INTRAVENOUS

## 2011-08-27 MED ORDER — HEMOSTATIC AGENTS (NO CHARGE) OPTIME
TOPICAL | Status: DC | PRN
  Administered 2011-08-27: 1 via TOPICAL

## 2011-08-27 MED ORDER — MORPHINE SULFATE 10 MG/ML IJ SOLN
0.0500 mg/kg | INTRAMUSCULAR | Status: DC | PRN
Start: 2011-08-27 — End: 2011-08-27

## 2011-08-27 SURGICAL SUPPLY — 70 items
APL SRG 3 HI ABS STRL LF PLS (MISCELLANEOUS)
APPLICATOR DR MATTHEWS STRL (MISCELLANEOUS) IMPLANT
BALL CTTN LRG ABS STRL LF (GAUZE/BANDAGES/DRESSINGS) ×3
BLADE EYE CATARACT 19 1.4 BEAV (BLADE) IMPLANT
BLADE MVR KNIFE 19G (BLADE) IMPLANT
BLADE MVR KNIFE 20G (BLADE) IMPLANT
CANNULA DUAL BORE 23G (CANNULA) IMPLANT
CANNULA FLEX TIP 25G (CANNULA) IMPLANT
CLOTH BEACON ORANGE TIMEOUT ST (SAFETY) ×2 IMPLANT
CORDS BIPOLAR (ELECTRODE) ×1 IMPLANT
COTTONBALL LRG STERILE PKG (GAUZE/BANDAGES/DRESSINGS) ×6 IMPLANT
DRAPE OPHTHALMIC 77X100 STRL (CUSTOM PROCEDURE TRAY) ×2 IMPLANT
FILTER BLUE MILLIPORE (MISCELLANEOUS) IMPLANT
FILTER STRAW FLUID ASPIR (MISCELLANEOUS) IMPLANT
FORCEPS ECKARDT ILM 25G SERR (OPHTHALMIC RELATED) IMPLANT
GLOVE SS BIOGEL STRL SZ 6.5 (GLOVE) IMPLANT
GLOVE SS BIOGEL STRL SZ 7 (GLOVE) ×1 IMPLANT
GLOVE SUPERSENSE BIOGEL SZ 6.5 (GLOVE) ×1
GLOVE SUPERSENSE BIOGEL SZ 7 (GLOVE) ×2
GLOVE SURG 8.5 LATEX PF (GLOVE) ×2 IMPLANT
GLOVE SURG SS PI 6.5 STRL IVOR (GLOVE) ×2 IMPLANT
GOWN STRL NON-REIN LRG LVL3 (GOWN DISPOSABLE) ×7 IMPLANT
ILLUMINATOR CHOW PICK 25GA (MISCELLANEOUS) ×2 IMPLANT
KIT BASIN OR (CUSTOM PROCEDURE TRAY) ×2 IMPLANT
KIT ROOM TURNOVER OR (KITS) ×2 IMPLANT
KNIFE CRESCENT 2.5 55 ANG (BLADE) IMPLANT
LENS BIOM SUPER VIEW SET DISP (OPHTHALMIC RELATED) ×1 IMPLANT
MARKER SKIN DUAL TIP RULER LAB (MISCELLANEOUS) ×2 IMPLANT
MASK EYE SHIELD (GAUZE/BANDAGES/DRESSINGS) ×1 IMPLANT
MICROPICK 25G (MISCELLANEOUS)
NDL 18GX1X1/2 (RX/OR ONLY) (NEEDLE) ×1 IMPLANT
NDL 25GX 5/8IN NON SAFETY (NEEDLE) ×1 IMPLANT
NDL FILTER BLUNT 18X1 1/2 (NEEDLE) ×1 IMPLANT
NDL HYPO 30X.5 LL (NEEDLE) ×1 IMPLANT
NEEDLE 18GX1X1/2 (RX/OR ONLY) (NEEDLE) ×2 IMPLANT
NEEDLE 25GX 5/8IN NON SAFETY (NEEDLE) ×2 IMPLANT
NEEDLE 27GAX1X1/2 (NEEDLE) IMPLANT
NEEDLE FILTER BLUNT 18X 1/2SAF (NEEDLE) ×1
NEEDLE FILTER BLUNT 18X1 1/2 (NEEDLE) ×1 IMPLANT
NEEDLE HYPO 30X.5 LL (NEEDLE) ×2 IMPLANT
NS IRRIG 1000ML POUR BTL (IV SOLUTION) ×2 IMPLANT
PACK VITRECTOMY CUSTOM (CUSTOM PROCEDURE TRAY) ×2 IMPLANT
PAD ARMBOARD 7.5X6 YLW CONV (MISCELLANEOUS) ×4 IMPLANT
PAD EYE OVAL STERILE LF (GAUZE/BANDAGES/DRESSINGS) ×1 IMPLANT
PAK VITRECTOMY PIK 25 GA (OPHTHALMIC RELATED) ×2 IMPLANT
PENCIL BIPOLAR 25GA STR DISP (OPHTHALMIC RELATED) ×1 IMPLANT
PICK MICROPICK 25G (MISCELLANEOUS) IMPLANT
PROBE DIRECTIONAL LASER (MISCELLANEOUS) ×1 IMPLANT
REPL STRA BRUSH NDL (NEEDLE) IMPLANT
REPL STRA BRUSH NEEDLE (NEEDLE) IMPLANT
RESERVOIR BACK FLUSH (MISCELLANEOUS) IMPLANT
ROLLS DENTAL (MISCELLANEOUS) ×4 IMPLANT
SCRAPER DIAMOND DUST MEMBRANE (MISCELLANEOUS) IMPLANT
SPONGE SURGIFOAM ABS GEL 12-7 (HEMOSTASIS) ×2 IMPLANT
STOPCOCK 4 WAY LG BORE MALE ST (IV SETS) IMPLANT
SUT CHROMIC 7 0 TG140 8 (SUTURE) IMPLANT
SUT ETHILON 10 0 CS140 6 (SUTURE) IMPLANT
SUT ETHILON 9 0 TG140 8 (SUTURE) IMPLANT
SUT POLY NON ABSORB 10-0 8 STR (SUTURE) IMPLANT
SUT SILK 4 0 RB 1 (SUTURE) IMPLANT
SYR 20CC LL (SYRINGE) ×2 IMPLANT
SYR 5ML LL (SYRINGE) IMPLANT
SYR BULB 3OZ (MISCELLANEOUS) ×2 IMPLANT
SYR TB 1ML LUER SLIP (SYRINGE) ×2 IMPLANT
SYRINGE 10CC LL (SYRINGE) IMPLANT
TAPE SURG TRANSPORE 1 IN (GAUZE/BANDAGES/DRESSINGS) IMPLANT
TAPE SURGICAL TRANSPORE 1 IN (GAUZE/BANDAGES/DRESSINGS) ×1
TOWEL OR 17X24 6PK STRL BLUE (TOWEL DISPOSABLE) ×6 IMPLANT
TROCAR CANNULA 25GA (CANNULA) IMPLANT
WATER STERILE IRR 1000ML POUR (IV SOLUTION) ×2 IMPLANT

## 2011-08-27 NOTE — Preoperative (Signed)
Beta Blockers   Reason not to administer Beta Blockers:Not Applicable 

## 2011-08-27 NOTE — Anesthesia Preprocedure Evaluation (Addendum)
Anesthesia Evaluation  Patient identified by MRN, date of birth, ID band Patient awake    Reviewed: Allergy & Precautions, H&P , NPO status , Patient's Chart, lab work & pertinent test results  Airway Mallampati: I TM Distance: <3 FB Neck ROM: Full    Dental  (+) Edentulous Upper   Pulmonary shortness of breath and with exertion, sleep apnea ,  breath sounds clear to auscultation        Cardiovascular hypertension, Pt. on medications Rhythm:Regular Rate:Normal     Neuro/Psych  Headaches,    GI/Hepatic negative GI ROS, Neg liver ROS,   Endo/Other  Diabetes mellitus-, Well Controlled, Type 1, Insulin Dependent  Renal/GU Renal disease     Musculoskeletal negative musculoskeletal ROS (+)   Abdominal (+)  Abdomen: soft. Bowel sounds: normal.  Peds  Hematology negative hematology ROS (+)   Anesthesia Other Findings   Reproductive/Obstetrics                        Anesthesia Physical Anesthesia Plan  ASA: III  Anesthesia Plan: General   Post-op Pain Management:    Induction: Intravenous, Rapid sequence and Cricoid pressure planned  Airway Management Planned: Oral ETT  Additional Equipment:   Intra-op Plan:   Post-operative Plan: Extubation in OR  Informed Consent:   Plan Discussed with: CRNA  Anesthesia Plan Comments:         Anesthesia Quick Evaluation

## 2011-08-27 NOTE — Transfer of Care (Signed)
Immediate Anesthesia Transfer of Care Note  Patient: [name]  Procedure(s) Performed: Procedure(s) (LRB): PARS PLANA VITRECTOMY WITH 25 GAUGE (Left)  Patient Location: PACU  Anesthesia Type: General  Level of Consciousness: awake and sedated  Airway & Oxygen Therapy: Patient Spontanous Breathing  Post-op Assessment: Report given to PACU RN  Post vital signs: stable  Complications: No apparent anesthesia complications

## 2011-08-27 NOTE — Anesthesia Postprocedure Evaluation (Signed)
  Anesthesia Post-op Note  Patient: [name]  Procedure(s) Performed: Procedure(s) (LRB): PARS PLANA VITRECTOMY WITH 25 GAUGE (Left)  Patient Location: PACU  Anesthesia Type: General  Level of Consciousness: awake  Airway and Oxygen Therapy: Patient Spontanous Breathing  Post-op Pain: mild  Post-op Assessment: Post-op Vital signs reviewed  Post-op Vital Signs: stable  Complications: No apparent anesthesia complications

## 2011-08-27 NOTE — Brief Op Note (Signed)
Brief Operative note   Preoperative diagnosis:  Pre-Op Diagnosis Codes:    * Vitreous hemorrhage [379.23]    * Proliferative diabetic retinopathy [250.50, 362.02] Postoperative diagnosis  Post-Op Diagnosis Codes:    * Vitreous hemorrhage [379.23]    * Proliferative diabetic retinopathy [250.50, 362.02]  Procedures: Repair of complex tractional retinal detachment with vitrectomy laser peel and gas  Surgeon:  Hayden Pedro, MD...  Assistant:  Deatra Ina SA    Anesthesia: General  Specimen: none  Estimated blood loss:  1cc  Complications: none  Patient sent to PACU in good condition  Composed by Hayden Pedro MD  Dictation number: [id number]

## 2011-08-27 NOTE — H&P (Signed)
I examined the patient today and there is no change in the medical status 

## 2011-08-27 NOTE — Anesthesia Procedure Notes (Addendum)
Performed by: Dollene Cleveland M   Procedure Name: Intubation Date/Time: 08/27/2011 12:53 PM Performed by: Maeola Harman Pre-anesthesia Checklist: Patient identified, Emergency Drugs available, Suction available, Patient being monitored and Timeout performed Patient Re-evaluated:Patient Re-evaluated prior to inductionOxygen Delivery Method: Circle system utilized Preoxygenation: Pre-oxygenation with 100% oxygen Intubation Type: IV induction and Rapid sequence Laryngoscope Size: Mac and 4 Grade View: Grade II Tube type: Oral Tube size: 7.5 mm Number of attempts: 1 Airway Equipment and Method: Stylet Placement Confirmation: ETT inserted through vocal cords under direct vision,  positive ETCO2 and breath sounds checked- equal and bilateral Secured at: 23 cm Tube secured with: Tape Dental Injury: Teeth and Oropharynx as per pre-operative assessment

## 2011-08-28 LAB — GLUCOSE, CAPILLARY
Glucose-Capillary: 136 mg/dL — ABNORMAL HIGH (ref 70–99)
Glucose-Capillary: 137 mg/dL — ABNORMAL HIGH (ref 70–99)
Glucose-Capillary: 138 mg/dL — ABNORMAL HIGH (ref 70–99)
Glucose-Capillary: 140 mg/dL — ABNORMAL HIGH (ref 70–99)
Glucose-Capillary: 145 mg/dL — ABNORMAL HIGH (ref 70–99)
Glucose-Capillary: 147 mg/dL — ABNORMAL HIGH (ref 70–99)
Glucose-Capillary: 153 mg/dL — ABNORMAL HIGH (ref 70–99)
Glucose-Capillary: 217 mg/dL — ABNORMAL HIGH (ref 70–99)

## 2011-08-28 MED ORDER — OXYCODONE-ACETAMINOPHEN 5-325 MG PO TABS: 1.0000 | ORAL_TABLET | ORAL | Status: AC | PRN

## 2011-08-28 MED ORDER — GATIFLOXACIN 0.5 % OP SOLN: 1.0000 [drp] | Freq: Four times a day (QID) | OPHTHALMIC | Status: DC

## 2011-08-28 MED ORDER — PREDNISOLONE ACETATE 1 % OP SUSP: 1.0000 [drp] | Freq: Four times a day (QID) | OPHTHALMIC | Status: AC

## 2011-08-28 MED ORDER — BACITRACIN-POLYMYXIN B 500-10000 UNIT/GM OP OINT: 1.0000 "application " | TOPICAL_OINTMENT | Freq: Four times a day (QID) | OPHTHALMIC | Status: AC

## 2011-08-28 NOTE — Progress Notes (Signed)
08/28/2011, 6:44 AM  Mental Status:  Awake, Alert, Oriented  Anterior segment: Cornea  Clear    Anterior Chamber Clear    Lens:   Cataract  Intra Ocular Pressure 14 mmHg with Tonopen  Vitreous: Clear 30%gas bubble  Retina:  Attached Good laser reaction   Impression: Excellent result Retina attached  Final Diagnosis: Principal Problem:  *Traction detachment of left retina Active Problems:  Proliferative diabetic retinopathy associated with type 2 diabetes mellitus   Plan: start post operative eye drops.  Discharge to home.  Give post operative instructions  Hayden Pedro 08/28/2011, 6:44 AM

## 2011-08-28 NOTE — Op Note (Signed)
NAMEPRAHLAD, FREYTES NO.:  [id number]  MEDICAL RECORD NO.:  [id number]  LOCATION:  [id number]                         FACILITY:  [facility]  PHYSICIAN:  Chrystie Nose. Zigmund Daniel, [name] DATE OF BIRTH:  1951-02-02  DATE OF PROCEDURE:  08/27/2011 DATE OF DISCHARGE:                              OPERATIVE REPORT   ADMISSION DIAGNOSIS:  Traction Retinal detachment, Preretinal fibrosis, proliferative diabetic retinopathy, vitreous hemorrhage, left eye.  PROCEDURES:  Pars plana vitrectomy with membrane peel, retinal photocoagulation, gas injection all left eye.  SURGEON:  Chrystie Nose. Zigmund Daniel, MD  ASSISTANT:  Deatra Ina, SA.  ANESTHESIA:  General.  DETAILS:  Usual prep and drape, 25-gauge trocars placed at 10, 2 and 4 o'clock with infusion at 4 o'clock.  Provisc placed on the corneal surface and the BIOM viewing system was moved into place.  Pars plana vitrectomy was begun just behind the cataractous lens.  A dense red blood was encountered and a taut membrane was seen attached to the macular region and the arcades.  Membrane was engaged with the lighted pick and peeled from its attachments to the macular surface.  The edges of the membrane were trimmed down to the vascular arcades and cauterized with endodiathermy.  The vitrectomy was carried into the mid periphery where surface proliferation was seen and this was removed with the vitreous cutter for 360 degrees.  Attention was carried to the far periphery where red blood and membranes were encountered along with neovascularization.  These areas were trimmed down to the vitreous base, and neovascularization was treated with endocautery.  Once all the vitreous was removed, the endolaser was positioned in the eye, 525 burns were placed around the retinal periphery and on areas of neovascularization.  The power was 1500 mW, 1000 microns each and 0.1 seconds each.  A 30% gas-fluid exchange was performed.  The instruments were removed  from the eye.  The wounds were tested and found to be secure.  Polymyxin and gentamicin were irrigated into [place] space. Atropine solution was applied.  Marcaine was injected around the globe for postop pain.  Decadron 10 mg was injected into the lower subconjunctival space.  Polysporin ophthalmic ointment, a patch and shield were placed.  Closing pressure was 10 with a [place] tonometer.  COMPLICATIONS:  None.  DURATION:  One hour.  The patient was awakened and taken to recovery in satisfactory condition.     Chrystie Nose. Zigmund Daniel, [name]     JDM/MEDQ  D:  08/27/2011  T:  08/28/2011  Job:  [id number]

## 2011-08-28 NOTE — Discharge Summary (Signed)
Discharge summary not needed on [facility] patients per medical records. 

## 2011-08-28 NOTE — Progress Notes (Signed)
Patient discharged home in stable condition. Verbalizes understanding of all discharge instructions, including home medications and follow up appointments. 

## 2011-09-01 ENCOUNTER — Encounter (HOSPITAL_COMMUNITY): Payer: Self-pay | Admitting: Ophthalmology

## 2011-09-07 ENCOUNTER — Inpatient Hospital Stay (INDEPENDENT_AMBULATORY_CARE_PROVIDER_SITE_OTHER): Payer: Medicare Other | Admitting: Ophthalmology

## 2011-09-07 DIAGNOSIS — E1139 Type 2 diabetes mellitus with other diabetic ophthalmic complication: Secondary | ICD-10-CM

## 2011-09-07 DIAGNOSIS — E1165 Type 2 diabetes mellitus with hyperglycemia: Secondary | ICD-10-CM

## 2011-09-07 DIAGNOSIS — E11359 Type 2 diabetes mellitus with proliferative diabetic retinopathy without macular edema: Secondary | ICD-10-CM

## 2011-09-18 ENCOUNTER — Encounter: Payer: Self-pay | Admitting: *Deleted

## 2011-09-18 ENCOUNTER — Other Ambulatory Visit: Payer: Self-pay | Admitting: Oncology

## 2011-09-18 DIAGNOSIS — C8589 Other specified types of non-Hodgkin lymphoma, extranodal and solid organ sites: Secondary | ICD-10-CM

## 2011-09-22 DIAGNOSIS — I129 Hypertensive chronic kidney disease with stage 1 through stage 4 chronic kidney disease, or unspecified chronic kidney disease: Secondary | ICD-10-CM | POA: Diagnosis not present

## 2011-09-22 DIAGNOSIS — I10 Essential (primary) hypertension: Secondary | ICD-10-CM | POA: Diagnosis not present

## 2011-09-22 DIAGNOSIS — N2581 Secondary hyperparathyroidism of renal origin: Secondary | ICD-10-CM | POA: Diagnosis not present

## 2011-09-22 DIAGNOSIS — R5383 Other fatigue: Secondary | ICD-10-CM | POA: Diagnosis not present

## 2011-09-22 DIAGNOSIS — R5381 Other malaise: Secondary | ICD-10-CM | POA: Diagnosis not present

## 2011-09-22 DIAGNOSIS — R609 Edema, unspecified: Secondary | ICD-10-CM | POA: Diagnosis not present

## 2011-09-22 DIAGNOSIS — N184 Chronic kidney disease, stage 4 (severe): Secondary | ICD-10-CM | POA: Diagnosis not present

## 2011-09-23 ENCOUNTER — Telehealth: Payer: Self-pay | Admitting: Oncology

## 2011-09-23 NOTE — Telephone Encounter (Signed)
S/w pt re appt for 4/30.

## 2011-09-28 ENCOUNTER — Emergency Department (HOSPITAL_COMMUNITY): Payer: Medicare Other

## 2011-09-28 ENCOUNTER — Encounter (INDEPENDENT_AMBULATORY_CARE_PROVIDER_SITE_OTHER): Payer: Medicare Other | Admitting: Ophthalmology

## 2011-09-28 ENCOUNTER — Emergency Department (HOSPITAL_COMMUNITY)
Admission: EM | Admit: 2011-09-28 | Discharge: 2011-09-28 | Disposition: A | Discharge status: Home / Self Care | Payer: Medicare Other | Attending: Emergency Medicine | Admitting: Emergency Medicine

## 2011-09-28 ENCOUNTER — Encounter (HOSPITAL_COMMUNITY): Payer: Self-pay | Admitting: *Deleted

## 2011-09-28 DIAGNOSIS — Z7901 Long term (current) use of anticoagulants: Secondary | ICD-10-CM | POA: Insufficient documentation

## 2011-09-28 DIAGNOSIS — Z86718 Personal history of other venous thrombosis and embolism: Secondary | ICD-10-CM | POA: Diagnosis not present

## 2011-09-28 DIAGNOSIS — S82209A Unspecified fracture of shaft of unspecified tibia, initial encounter for closed fracture: Secondary | ICD-10-CM | POA: Diagnosis not present

## 2011-09-28 DIAGNOSIS — S82409A Unspecified fracture of shaft of unspecified fibula, initial encounter for closed fracture: Secondary | ICD-10-CM | POA: Diagnosis not present

## 2011-09-28 DIAGNOSIS — E119 Type 2 diabetes mellitus without complications: Secondary | ICD-10-CM | POA: Insufficient documentation

## 2011-09-28 DIAGNOSIS — W1789XA Other fall from one level to another, initial encounter: Secondary | ICD-10-CM | POA: Insufficient documentation

## 2011-09-28 DIAGNOSIS — I10 Essential (primary) hypertension: Secondary | ICD-10-CM | POA: Insufficient documentation

## 2011-09-28 DIAGNOSIS — E785 Hyperlipidemia, unspecified: Secondary | ICD-10-CM | POA: Diagnosis not present

## 2011-09-28 DIAGNOSIS — Z87898 Personal history of other specified conditions: Secondary | ICD-10-CM | POA: Insufficient documentation

## 2011-09-28 DIAGNOSIS — M79609 Pain in unspecified limb: Secondary | ICD-10-CM | POA: Diagnosis not present

## 2011-09-28 DIAGNOSIS — S99929A Unspecified injury of unspecified foot, initial encounter: Secondary | ICD-10-CM | POA: Diagnosis not present

## 2011-09-28 DIAGNOSIS — S82839A Other fracture of upper and lower end of unspecified fibula, initial encounter for closed fracture: Secondary | ICD-10-CM | POA: Diagnosis not present

## 2011-09-28 DIAGNOSIS — S92009A Unspecified fracture of unspecified calcaneus, initial encounter for closed fracture: Secondary | ICD-10-CM | POA: Diagnosis not present

## 2011-09-28 DIAGNOSIS — M7730 Calcaneal spur, unspecified foot: Secondary | ICD-10-CM | POA: Diagnosis not present

## 2011-09-28 DIAGNOSIS — S8990XA Unspecified injury of unspecified lower leg, initial encounter: Secondary | ICD-10-CM | POA: Diagnosis not present

## 2011-09-28 DIAGNOSIS — M7989 Other specified soft tissue disorders: Secondary | ICD-10-CM | POA: Diagnosis not present

## 2011-09-28 LAB — GLUCOSE, CAPILLARY: Glucose-Capillary: 151 mg/dL — ABNORMAL HIGH (ref 70–99)

## 2011-09-28 MED ORDER — OXYCODONE-ACETAMINOPHEN 5-325 MG PO TABS: 1.0000 | ORAL_TABLET | ORAL | Status: AC | PRN

## 2011-09-28 NOTE — ED Provider Notes (Signed)
History     CSN: [id number]  Arrival date & time 09/28/11  0847   First MD Initiated Contact with Patient 09/28/11 [phone]      Chief Complaint  Patient presents with  . Fall  . Leg Injury    (Consider location/radiation/quality/duration/timing/severity/associated sxs/prior treatment) HPI Pain right lower leg after fall last week.  Patient states mechanical fall from truck, no loc, no other injury.  Bearing weight with difficulty on right leg.  Patient notes swelling extending down to ankle.  No break in skin.   Past Medical History  Diagnosis Date  . Cancer   . Diabetes mellitus   . DVT (deep venous thrombosis)     Right leg  . Diabetic retinopathy   . Hypertension   . Hyperlipidemia   . Non Hodgkin's lymphoma     Tx 2009  . History of cardiac catheterization   . Nodular lymphoma of intra-abdominal lymph nodes   . Shortness of breath   . Sleep apnea     "suppose to have a sleep studfy, but they never told me when.  . Peripheral vascular disease   . Blood transfusion   . 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  Marland Kitchen Headache     Past Surgical History  Procedure Date  . Cardiac catheterization   . Porta catheter   . Porta catheter insertion   . Porta catheter removed   . Coloscopy   . Pars plana vitrectomy 08/27/2011    Procedure: PARS PLANA VITRECTOMY WITH 25 GAUGE;  Surgeon: Hayden Pedro, MD;  Location: [place];  Service: Ophthalmology;  Laterality: Left;  Repair of complex traction retinal detachment left eye    Family History  Problem Relation Age of Onset  . Anesthesia problems Son     History  Substance Use Topics  . Smoking status: Never Smoker   . Smokeless tobacco: Never Used  . Alcohol Use: No      Review of Systems  All other systems reviewed and are negative.    Allergies  Review of patient's allergies indicates no known allergies.  Home Medications   Current Outpatient Rx  Name Route Sig Dispense Refill  . ACETAMINOPHEN  500 MG PO TABS Oral Take 1,000 mg by mouth every 6 (six) hours as needed. For headache     . AMLODIPINE BESYLATE 5 MG PO TABS Oral Take 1 tablet (5 mg total) by mouth daily. 30 tablet 6  . CALCITRIOL 0.25 MCG PO CAPS Oral Take 0.25 mcg by mouth daily.      Marland Kitchen CARVEDILOL 25 MG PO TABS Oral Take 25 mg by mouth 2 (two) times daily with a meal.    . FUROSEMIDE 80 MG PO TABS Oral Take 1 tablet (80 mg total) by mouth 2 (two) times daily. 60 tablet 5  . GATIFLOXACIN 0.5 % OP SOLN Left Eye Place 1 drop into the left eye 4 (four) times daily.    . INSULIN GLARGINE 100 UNIT/ML [place] SOLN Subcutaneous Inject 50 Units into the skin at bedtime. 10 mL 12  . INSULIN LISPRO (HUMAN) 100 UNIT/ML [place] SOLN Subcutaneous Inject 75 Units into the skin 2 (two) times daily with breakfast and lunch.     Marland Kitchen POLYSACCHARIDE IRON 150 MG PO CAPS Oral Take 1 capsule (150 mg total) by mouth daily. 30 each 2  . POTASSIUM CHLORIDE CRYS ER 20 MEQ PO TBCR Oral Take 1 tablet (20 mEq total) by mouth daily. 30 tablet 1  .  WARFARIN SODIUM 5 MG PO TABS Oral Take 12.5 mg by mouth daily.       BP 131/64  Pulse 80  Temp(Src) 97.6 F (36.4 C) (Oral)  Resp 18  Wt 303 lb (137.44 kg)  SpO2 100%  Physical Exam  Nursing note and vitals reviewed. Constitutional: He is oriented to person, place, and time. He appears well-developed and well-nourished.  HENT:  Head: Normocephalic and atraumatic.  Right Ear: External ear normal.  Left Ear: External ear normal.  Nose: Nose normal.  Mouth/Throat: Oropharynx is clear and moist.  Eyes: Conjunctivae and EOM are normal. Pupils are equal, round, and reactive to light.  Neck: Normal range of motion. Neck supple.  Cardiovascular: Normal rate, regular rhythm, normal heart sounds and intact distal pulses.   Pulmonary/Chest: Effort normal and breath sounds normal.  Abdominal: Soft. Bowel sounds are normal.  Musculoskeletal: Normal range of motion.       Tender over right fibular head, dp in place,  swelling noted at ankle but no tenderness.    Neurological: He is alert and oriented to person, place, and time. He has normal reflexes.  Skin: Skin is warm and dry.  Psychiatric: He has a normal mood and affect. His behavior is normal. Thought content normal.     ED Course  Procedures (including critical care time)  Labs Reviewed  GLUCOSE, CAPILLARY - Abnormal; Notable for the following:    Glucose-Capillary 151 (*)    All other components within normal limits   Dg Tibia/fibula Right  09/28/2011  *RADIOLOGY REPORT*  Clinical Data: History of fall with injury of the lady complaining of pain over the proximal third of the right tibia and fibula.  RIGHT TIBIA AND FIBULA - 2 VIEW  Comparison: No priors.  Findings: AP and lateral views of the right tibia and fibula demonstrate a mildly comminuted nondisplaced fracture through the proximal right fibula, just distal to the head. There is no significant angulation.  The tibia is intact.  Overlying soft tissues appear mildly swollen.  IMPRESSION: 1.  Acute mildly comminuted but nondisplaced and nonangulated fracture of the proximal fibula, as above.  Original Report Authenticated By: Etheleen Mayhew, [name]   Dg Ankle Complete Right  09/28/2011  *RADIOLOGY REPORT*  Clinical Data: History of fall and leg injury.  RIGHT ANKLE - COMPLETE 3+ VIEW  Comparison: No priors.  Findings: Three views of the right ankle demonstrate a subtle lucency through the distal aspect of the medial malleolus suspicious for a nondisplaced fracture.  There is extensive overlying soft tissue swelling both medially and laterally to the tibiotalar joint.  Overall, the ankle mortise is preserved. Lateral view demonstrates a plantar calcaneal spur. Numerous vascular calcifications are noted.  IMPRESSION: 1.  The findings are concerning for a subtle nondisplaced fracture through the distal aspect of the medial malleolus, as above. 2.  Plantar calcaneal spur. 3.  Atherosclerosis.   Original Report Authenticated By: Etheleen Mayhew, [name]   Ct Ankle Right Wo Contrast  09/28/2011  *RADIOLOGY REPORT*  Clinical Data: [place].  Injured leg.  Proximal fibular shaft fracture.  Question of medial malleolus fracture.  CT OF THE RIGHT ANKLE WITH CONTRAST  Technique:  Multidetector CT imaging was performed following the standard protocol during bolus administration of intravenous contrast.  Comparison: None.  Findings: The ankle mortise is maintained.  No acute fracture of the medial or lateral malleoli.  No osteochondral abnormality.  The talus is intact.  The subtalar joints are maintained.  There are  multiple foot fractures.  These are small nondisplaced fractures involving  the anterior aspect of the calcaneus, the medial aspect of the cuboid, the medial, middle and lateral cuneiforms and the base of the second metatarsal.  A dedicated foot CT may be helpful to exclude a Lisfranc's injury.  There are extensive small vessel calcifications.  A calcaneal heel spur is noted.  IMPRESSION:  1.  No acute ankle fracture. 2.  Multiple small nondisplaced foot fractures as discussed above. If there is any clinical concern for a Lisfranc's injury a dedicated foot CT is suggested.  Original Report Authenticated By: P. Kalman Jewels, [name]     No diagnosis found.    MDM    Discussed with Dr. Noemi Chapel and advised ct.  Patient with negative ct ankle done due to questionable fracture on plain film.  Patient without pain at ankle.  Plan knee immobilizer and follow up with Dr. Noemi Chapel next week.        Shaune Pollack, MD 09/28/11 [phone]

## 2011-09-28 NOTE — ED Notes (Signed)
Patient transported to CT 

## 2011-09-28 NOTE — Discharge Instructions (Signed)
Cast or Splint Care Casts and splints support injured limbs and keep bones from moving while they heal.  HOME CARE  Keep the cast or splint uncovered during the drying period.   A plaster cast can take 24 to 48 hours to dry.   A fiberglass cast will dry in less than 1 hour.   Do not rest the cast on anything harder than a pillow for 24 hours.   Do not put weight on your injured limb. Do not put pressure on the cast. Wait for your doctor's approval.   Keep the cast or splint dry.   Cover the cast or splint with a plastic bag during baths or wet weather.   If you have a cast over your chest and belly (trunk), take sponge baths until the cast is taken off.   Keep your cast or splint clean. Wash a dirty cast with a damp cloth.   Do not put any objects under your cast or splint. Do not scratch the skin under the cast with an object.   Do not take out the padding from inside your cast.   Exercise your joints near the cast as told by your doctor.   Raise (elevate) your injured limb on 1 or 2 pillows for the first 1 to 3 days.  GET HELP RIGHT AWAY IF:  Your cast or splint cracks.   Your cast or splint is too tight or too loose.   You itch badly under the cast.   Your cast gets wet or has a soft spot.   You have a bad smell coming from the cast.   You get an object stuck under the cast.   Your skin around the cast becomes red or raw.   You have new or more pain after the cast is put on.   You have fluid leaking through the cast.   You cannot move your fingers or toes.   Your fingers or toes turn colors or are cool, painful, or puffy (swollen).   You have tingling or lose feeling (numbness) around the injured area.   You have pain or pressure under the cast.   You have trouble breathing or have shortness of breath.   You have chest pain.  MAKE SURE YOU:  Understand these instructions.   Will watch your condition.   Will get help right away if you are not doing  well or get worse.  Document Released: 09/17/2010 Document Revised: 05/07/2011 Document Reviewed: 09/17/2010  You have a fracture of the proximal aspect of your fibula. Please wear the knee immobilizer at all times. Please do not put weight on this leg except using crutches. Call Dr. Penelope Coop office today for followup next week. ExitCare Patient Information 2012 [place].

## 2011-09-28 NOTE — ED Notes (Signed)
Has been using heating pad and elevation with some success. But swelling is worse today

## 2011-09-28 NOTE — ED Notes (Signed)
Pt states "i was with my son who drives a truck, my feet got tangled up and I fell on my right side, my right leg is swelling from the ankle to my knee"

## 2011-09-29 ENCOUNTER — Encounter: Payer: Self-pay | Admitting: Oncology

## 2011-09-29 ENCOUNTER — Ambulatory Visit (HOSPITAL_BASED_OUTPATIENT_CLINIC_OR_DEPARTMENT_OTHER): Payer: Medicare Other | Admitting: Oncology

## 2011-09-29 ENCOUNTER — Other Ambulatory Visit (HOSPITAL_BASED_OUTPATIENT_CLINIC_OR_DEPARTMENT_OTHER): Payer: Medicare Other | Admitting: Lab

## 2011-09-29 ENCOUNTER — Telehealth: Payer: Self-pay | Admitting: Oncology

## 2011-09-29 VITALS — BP 106/55 | HR 85 | Temp 96.7°F | Ht 75.0 in | Wt 305.1 lb

## 2011-09-29 DIAGNOSIS — N289 Disorder of kidney and ureter, unspecified: Secondary | ICD-10-CM | POA: Diagnosis not present

## 2011-09-29 DIAGNOSIS — Z7901 Long term (current) use of anticoagulants: Secondary | ICD-10-CM | POA: Diagnosis not present

## 2011-09-29 DIAGNOSIS — C8590 Non-Hodgkin lymphoma, unspecified, unspecified site: Secondary | ICD-10-CM

## 2011-09-29 DIAGNOSIS — I10 Essential (primary) hypertension: Secondary | ICD-10-CM

## 2011-09-29 DIAGNOSIS — C8589 Other specified types of non-Hodgkin lymphoma, extranodal and solid organ sites: Secondary | ICD-10-CM

## 2011-09-29 DIAGNOSIS — R109 Unspecified abdominal pain: Secondary | ICD-10-CM | POA: Diagnosis not present

## 2011-09-29 DIAGNOSIS — C8583 Other specified types of non-Hodgkin lymphoma, intra-abdominal lymph nodes: Secondary | ICD-10-CM | POA: Diagnosis not present

## 2011-09-29 DIAGNOSIS — E119 Type 2 diabetes mellitus without complications: Secondary | ICD-10-CM | POA: Diagnosis not present

## 2011-09-29 DIAGNOSIS — I82409 Acute embolism and thrombosis of unspecified deep veins of unspecified lower extremity: Secondary | ICD-10-CM | POA: Diagnosis not present

## 2011-09-29 LAB — CBC WITH DIFFERENTIAL/PLATELET
BASO%: 0.8 % (ref 0.0–2.0)
Basophils Absolute: 0 10*3/uL (ref 0.0–0.1)
EOS%: 3.1 % (ref 0.0–7.0)
Eosinophils Absolute: 0.2 10*3/uL (ref 0.0–0.5)
HCT: 28.9 % — ABNORMAL LOW (ref 38.4–49.9)
HGB: 9.4 g/dL — ABNORMAL LOW (ref 13.0–17.1)
LYMPH%: 22.4 % (ref 14.0–49.0)
MCH: 27 pg — ABNORMAL LOW (ref 27.2–33.4)
MCHC: 32.6 g/dL (ref 32.0–36.0)
MCV: 82.6 fL (ref 79.3–98.0)
MONO#: 0.5 10*3/uL (ref 0.1–0.9)
MONO%: 9.5 % (ref 0.0–14.0)
NEUT#: 3.3 10*3/uL (ref 1.5–6.5)
NEUT%: 64.2 % (ref 39.0–75.0)
Platelets: 230 10*3/uL (ref 140–400)
RBC: 3.49 10*6/uL — ABNORMAL LOW (ref 4.20–5.82)
RDW: 17.2 % — ABNORMAL HIGH (ref 11.0–14.6)
WBC: 5.2 10*3/uL (ref 4.0–10.3)
lymph#: 1.2 10*3/uL (ref 0.9–3.3)
nRBC: 0 % (ref 0–0)

## 2011-09-29 LAB — COMPREHENSIVE METABOLIC PANEL
ALT: 11 U/L (ref 0–53)
AST: 16 U/L (ref 0–37)
Albumin: 3.8 g/dL (ref 3.5–5.2)
Alkaline Phosphatase: 86 U/L (ref 39–117)
BUN: 74 mg/dL — ABNORMAL HIGH (ref 6–23)
CO2: 22 mEq/L (ref 19–32)
Calcium: 8.8 mg/dL (ref 8.4–10.5)
Chloride: 107 mEq/L (ref 96–112)
Creatinine, Ser: 3.4 mg/dL — ABNORMAL HIGH (ref 0.50–1.35)
Glucose, Bld: 231 mg/dL — ABNORMAL HIGH (ref 70–99)
Potassium: 4.8 mEq/L (ref 3.5–5.3)
Sodium: 139 mEq/L (ref 135–145)
Total Bilirubin: 0.4 mg/dL (ref 0.3–1.2)
Total Protein: 6.6 g/dL (ref 6.0–8.3)

## 2011-09-29 LAB — LACTATE DEHYDROGENASE: LDH: 275 U/L — ABNORMAL HIGH (ref 94–250)

## 2011-09-29 NOTE — Patient Instructions (Signed)
We will schedule a PET scan in the next 1-2 weeks to check your lymphoma. We will call you with the results.  Follow-up with Dr Alen Blew in about 3 months.

## 2011-09-29 NOTE — Telephone Encounter (Signed)
scheduled pt for pet scan on 05/07 @ [facility].  gv pt appt for [id number]

## 2011-09-29 NOTE — Progress Notes (Signed)
Hematology and Oncology Follow Up Visit  AVIAN GASTER [id number] 1950-06-14 61 y.o. 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  Prior Therapy: Patient treated with 6 cycles with CHOP and rituximab.  Therapy concluded in October 2009.  Followup PET CT scan continued to show complete response.  Current therapy: Observation  Interim History:  Mr. Zahra presents today for a followup visit.  We last saw the patient in October 2012. He was due to followup with [place] in February 2013 with restaging scans, but the patient missed this appointment. He continues to have intermittent stomach pain which is unchanged from previous reports. He has had this worked up in the past with a negative workup. Patient fractured his right leg yesterday and he was seen in the emergency room for this. He is due to followup with orthopedics next week. Patient's appetite is good his weight is stable. He continues to followup with nephrology and he has baseline creatinine ranging from 2.5-3.5. He has no fevers chills or night sweats. No chest pain shortness of breath or palpitations. No nausea or vomiting.  Medications: I have reviewed the patient's current medications. Current outpatient prescriptions:acetaminophen (TYLENOL) 500 MG tablet, Take 1,000 mg by mouth every 6 (six) hours as needed. For headache , Disp: , Rfl: ;  amLODipine (NORVASC) 5 MG tablet, Take 1 tablet (5 mg total) by mouth daily., Disp: 30 tablet, Rfl: 6;  calcitRIOL (ROCALTROL) 0.25 MCG capsule, Take 0.25 mcg by mouth daily.  , Disp: , Rfl: ;  carvedilol (COREG) 25 MG tablet, Take 25 mg by mouth 2 (two) times daily with a meal., Disp: , Rfl:  furosemide (LASIX) 80 MG tablet, Take 1 tablet (80 mg total) by mouth 2 (two) times daily., Disp: 60 tablet,  Rfl: 5;  gatifloxacin (ZYMAXID) 0.5 % SOLN, Place 1 drop into the left eye 3 (three) times daily., Disp: , Rfl: ;  insulin glargine (LANTUS) 100 UNIT/ML injection, Inject 50 Units into the skin at bedtime., Disp: 10 mL, Rfl: 12 insulin lispro (HUMALOG) 100 UNIT/ML injection, Inject 75 Units into the skin 2 (two) times daily with breakfast and lunch. , Disp: , Rfl: ;  oxyCODONE-acetaminophen (PERCOCET) 5-325 MG per tablet, Take 1 tablet by mouth every 4 (four) hours as needed for pain., Disp: 6 tablet, Rfl: 0;  polysaccharide iron (NIFEREX) 150 MG CAPS capsule, Take 1 capsule (150 mg total) by mouth daily., Disp: 30 each, Rfl: 2 potassium chloride SA (K-DUR,KLOR-CON) 20 MEQ tablet, Take 1 tablet (20 mEq total) by mouth daily., Disp: 30 tablet, Rfl: 1;  warfarin (COUMADIN) 5 MG tablet, Take 12.5 mg by mouth daily. , Disp: , Rfl:   Allergies: No Known Allergies  Past Medical History, Surgical history, Social history, and Family History were reviewed and updated.  Review of Systems: Constitutional:  Negative for fever, chills, night sweats, anorexia, weight loss, pain. Cardiovascular: no chest pain or dyspnea on exertion Respiratory: no cough, shortness of breath, or wheezing Neurological: no TIA or stroke symptoms Dermatological: negative ENT: negative Skin: Negative. Gastrointestinal: no abdominal pain, change in bowel habits, or black or bloody stools Genito-Urinary: no dysuria, trouble voiding, or hematuria Hematological and Lymphatic: negative Breast: negative for breast lumps Musculoskeletal: negative Remaining ROS negative. Physical Exam: Blood pressure 106/55, pulse 85, temperature 96.7 F (35.9 C), temperature source Oral, height 6'  3" (1.905 m), weight 305 lb 1.6 oz (138.392 kg). ECOG:  General appearance: alert, cooperative and no distress Head: Normocephalic, without obvious abnormality, atraumatic Neck: no adenopathy, no carotid bruit, no JVD, supple, symmetrical, trachea midline  and thyroid not enlarged, symmetric, no tenderness/mass/nodules Lymph nodes: Cervical, supraclavicular, and axillary nodes normal. Heart:regular rate and rhythm, S1, S2 normal, no murmur, click, rub or gallop Lung:chest clear, no wheezing, rales, normal symmetric air entry, no tachypnea, retractions or cyanosis Abdomen: soft, non-tender, without masses or organomegaly and normal bowel sounds EXT:no erythema, induration, or nodules   Lab Results: Lab Results  Component Value Date   WBC 5.2 09/29/2011   HGB 9.4* 09/29/2011   HCT 28.9* 09/29/2011   MCV 82.6 09/29/2011   PLT 230 09/29/2011     Chemistry      Component Value Date/Time   NA 142 08/27/2011 0850   K 4.5 08/27/2011 0850   CL 104 08/27/2011 0850   CO2 25 08/27/2011 0850   BUN 76* 08/27/2011 0850   CREATININE 3.15* 08/27/2011 0850   CREATININE 4.44* 01/14/2011 1428      Component Value Date/Time   CALCIUM 9.2 08/27/2011 0850   CALCIUM 7.8* 01/14/2011 1001   ALKPHOS 118* 05/20/2011 2045   AST 20 05/20/2011 2045   ALT 14 05/20/2011 2045   BILITOT 0.3 05/20/2011 2045       Radiological Studies: Dg Tibia/fibula Right  09/28/2011  *RADIOLOGY REPORT*  Clinical Data: History of fall with injury of the lady complaining of pain over the proximal third of the right tibia and fibula.  RIGHT TIBIA AND FIBULA - 2 VIEW  Comparison: No priors.  Findings: AP and lateral views of the right tibia and fibula demonstrate a mildly comminuted nondisplaced fracture through the proximal right fibula, just distal to the head. There is no significant angulation.  The tibia is intact.  Overlying soft tissues appear mildly swollen.  IMPRESSION: 1.  Acute mildly comminuted but nondisplaced and nonangulated fracture of the proximal fibula, as above.  Original Report Authenticated By: Etheleen Mayhew, [name]   Dg Ankle Complete Right  09/28/2011  *RADIOLOGY REPORT*  Clinical Data: History of fall and leg injury.  RIGHT ANKLE - COMPLETE 3+ VIEW  Comparison: No  priors.  Findings: Three views of the right ankle demonstrate a subtle lucency through the distal aspect of the medial malleolus suspicious for a nondisplaced fracture.  There is extensive overlying soft tissue swelling both medially and laterally to the tibiotalar joint.  Overall, the ankle mortise is preserved. Lateral view demonstrates a plantar calcaneal spur. Numerous vascular calcifications are noted.  IMPRESSION: 1.  The findings are concerning for a subtle nondisplaced fracture through the distal aspect of the medial malleolus, as above. 2.  Plantar calcaneal spur. 3.  Atherosclerosis.  Original Report Authenticated By: Etheleen Mayhew, [name]   Ct Ankle Right Wo Contrast  09/28/2011  *RADIOLOGY REPORT*  Clinical Data: [place].  Injured leg.  Proximal fibular shaft fracture.  Question of medial malleolus fracture.  CT OF THE RIGHT ANKLE WITH CONTRAST  Technique:  Multidetector CT imaging was performed following the standard protocol during bolus administration of intravenous contrast.  Comparison: None.  Findings: The ankle mortise is maintained.  No acute fracture of the medial or lateral malleoli.  No osteochondral abnormality.  The talus is intact.  The subtalar joints are maintained.  There are multiple foot fractures.  These are small nondisplaced fractures involving  the anterior aspect of the calcaneus, the medial aspect  of the cuboid, the medial, middle and lateral cuneiforms and the base of the second metatarsal.  A dedicated foot CT may be helpful to exclude a Lisfranc's injury.  There are extensive small vessel calcifications.  A calcaneal heel spur is noted.  IMPRESSION:  1.  No acute ankle fracture. 2.  Multiple small nondisplaced foot fractures as discussed above. If there is any clinical concern for a Lisfranc's injury a dedicated foot CT is suggested.  Original Report Authenticated By: P. Kalman Jewels, [name]   Impression and Plan: This is a 61 year old gentleman with the following  issues: 1. History of non-Hodgkin's lymphoma, diffuse large cell type.  He had been in remission since the conclusion of chemotherapy for the last 2 years.  His most recent imaging studies done in July 2012 showed about a 12-mm indeterminate lymph node, otherwise no evidence of any extensive disease.  The patient will have a repeat PET scan within the next one to 2 weeks. We'll contact him with the results. 2. Renal insufficiency. Due to hypertension and diabetes. He is followed by nephrology with a baseline creatinine currently at 2.5-3.5. Spent more than half the time coordinating care.  3. Hypertension. Controlled with his current medications. He will continue to followup with primary care provider for this. 4. DM. On Lantus and NovoLog insulin. Follow up with PCP. 5. Followup. In approximately 3 months.  Case reviewed with Dr Alen Blew.   [place], [place] 4/30/20133:54 PM

## 2011-10-06 ENCOUNTER — Encounter (HOSPITAL_COMMUNITY)
Admission: RE | Admit: 2011-10-06 | Discharge: 2011-10-06 | Disposition: A | Discharge status: Home / Self Care | Payer: Medicare Other | Source: Ambulatory Visit | Attending: Oncology | Admitting: Oncology

## 2011-10-06 DIAGNOSIS — C8589 Other specified types of non-Hodgkin lymphoma, extranodal and solid organ sites: Secondary | ICD-10-CM | POA: Insufficient documentation

## 2011-10-06 DIAGNOSIS — J3489 Other specified disorders of nose and nasal sinuses: Secondary | ICD-10-CM | POA: Diagnosis not present

## 2011-10-06 DIAGNOSIS — C8190 Hodgkin lymphoma, unspecified, unspecified site: Secondary | ICD-10-CM | POA: Diagnosis not present

## 2011-10-06 LAB — GLUCOSE, CAPILLARY: Glucose-Capillary: 99 mg/dL (ref 70–99)

## 2011-10-06 MED ORDER — FLUDEOXYGLUCOSE F - 18 (FDG) INJECTION
20.6000 | Freq: Once | INTRAVENOUS | Status: AC | PRN
  Administered 2011-10-06: 20.6 via INTRAVENOUS

## 2011-10-07 ENCOUNTER — Encounter (INDEPENDENT_AMBULATORY_CARE_PROVIDER_SITE_OTHER): Payer: Medicare Other | Admitting: Ophthalmology

## 2011-10-07 DIAGNOSIS — E11359 Type 2 diabetes mellitus with proliferative diabetic retinopathy without macular edema: Secondary | ICD-10-CM

## 2011-10-07 DIAGNOSIS — E1139 Type 2 diabetes mellitus with other diabetic ophthalmic complication: Secondary | ICD-10-CM

## 2011-10-08 DIAGNOSIS — S82409A Unspecified fracture of shaft of unspecified fibula, initial encounter for closed fracture: Secondary | ICD-10-CM | POA: Diagnosis not present

## 2011-10-08 DIAGNOSIS — S93409A Sprain of unspecified ligament of unspecified ankle, initial encounter: Secondary | ICD-10-CM | POA: Diagnosis not present

## 2011-10-29 DIAGNOSIS — Z7901 Long term (current) use of anticoagulants: Secondary | ICD-10-CM | POA: Diagnosis not present

## 2011-10-29 DIAGNOSIS — I82409 Acute embolism and thrombosis of unspecified deep veins of unspecified lower extremity: Secondary | ICD-10-CM | POA: Diagnosis not present

## 2011-10-29 DIAGNOSIS — N184 Chronic kidney disease, stage 4 (severe): Secondary | ICD-10-CM | POA: Diagnosis not present

## 2011-10-29 DIAGNOSIS — E1129 Type 2 diabetes mellitus with other diabetic kidney complication: Secondary | ICD-10-CM | POA: Diagnosis not present

## 2011-10-30 DIAGNOSIS — S93409A Sprain of unspecified ligament of unspecified ankle, initial encounter: Secondary | ICD-10-CM | POA: Diagnosis not present

## 2011-10-30 DIAGNOSIS — S82409A Unspecified fracture of shaft of unspecified fibula, initial encounter for closed fracture: Secondary | ICD-10-CM | POA: Diagnosis not present

## 2011-11-19 DIAGNOSIS — Z7901 Long term (current) use of anticoagulants: Secondary | ICD-10-CM | POA: Diagnosis not present

## 2011-11-19 DIAGNOSIS — I82409 Acute embolism and thrombosis of unspecified deep veins of unspecified lower extremity: Secondary | ICD-10-CM | POA: Diagnosis not present

## 2011-11-27 DIAGNOSIS — S82409A Unspecified fracture of shaft of unspecified fibula, initial encounter for closed fracture: Secondary | ICD-10-CM | POA: Diagnosis not present

## 2011-11-27 DIAGNOSIS — S8290XD Unspecified fracture of unspecified lower leg, subsequent encounter for closed fracture with routine healing: Secondary | ICD-10-CM | POA: Diagnosis not present

## 2011-11-27 DIAGNOSIS — S93409A Sprain of unspecified ligament of unspecified ankle, initial encounter: Secondary | ICD-10-CM | POA: Diagnosis not present

## 2011-12-16 ENCOUNTER — Encounter (INDEPENDENT_AMBULATORY_CARE_PROVIDER_SITE_OTHER): Payer: Medicare Other | Admitting: Ophthalmology

## 2011-12-16 DIAGNOSIS — H2510 Age-related nuclear cataract, unspecified eye: Secondary | ICD-10-CM

## 2011-12-16 DIAGNOSIS — E11359 Type 2 diabetes mellitus with proliferative diabetic retinopathy without macular edema: Secondary | ICD-10-CM

## 2011-12-16 DIAGNOSIS — E1139 Type 2 diabetes mellitus with other diabetic ophthalmic complication: Secondary | ICD-10-CM

## 2011-12-16 DIAGNOSIS — H43819 Vitreous degeneration, unspecified eye: Secondary | ICD-10-CM | POA: Diagnosis not present

## 2011-12-16 DIAGNOSIS — I82409 Acute embolism and thrombosis of unspecified deep veins of unspecified lower extremity: Secondary | ICD-10-CM | POA: Diagnosis not present

## 2011-12-16 DIAGNOSIS — H35039 Hypertensive retinopathy, unspecified eye: Secondary | ICD-10-CM | POA: Diagnosis not present

## 2011-12-16 DIAGNOSIS — I10 Essential (primary) hypertension: Secondary | ICD-10-CM

## 2011-12-16 DIAGNOSIS — Z7901 Long term (current) use of anticoagulants: Secondary | ICD-10-CM | POA: Diagnosis not present

## 2011-12-21 DIAGNOSIS — R5381 Other malaise: Secondary | ICD-10-CM | POA: Diagnosis not present

## 2011-12-21 DIAGNOSIS — N2581 Secondary hyperparathyroidism of renal origin: Secondary | ICD-10-CM | POA: Diagnosis not present

## 2011-12-21 DIAGNOSIS — N184 Chronic kidney disease, stage 4 (severe): Secondary | ICD-10-CM | POA: Diagnosis not present

## 2011-12-21 DIAGNOSIS — R5383 Other fatigue: Secondary | ICD-10-CM | POA: Diagnosis not present

## 2011-12-29 ENCOUNTER — Other Ambulatory Visit: Payer: Medicare Other

## 2011-12-29 ENCOUNTER — Ambulatory Visit: Payer: Medicare Other | Admitting: Oncology

## 2012-01-06 DIAGNOSIS — H2510 Age-related nuclear cataract, unspecified eye: Secondary | ICD-10-CM | POA: Diagnosis not present

## 2012-01-06 DIAGNOSIS — E11359 Type 2 diabetes mellitus with proliferative diabetic retinopathy without macular edema: Secondary | ICD-10-CM | POA: Diagnosis not present

## 2012-01-13 DIAGNOSIS — Z7901 Long term (current) use of anticoagulants: Secondary | ICD-10-CM | POA: Diagnosis not present

## 2012-01-13 DIAGNOSIS — I82409 Acute embolism and thrombosis of unspecified deep veins of unspecified lower extremity: Secondary | ICD-10-CM | POA: Diagnosis not present

## 2012-01-20 DIAGNOSIS — I82409 Acute embolism and thrombosis of unspecified deep veins of unspecified lower extremity: Secondary | ICD-10-CM | POA: Diagnosis not present

## 2012-01-20 DIAGNOSIS — Z7901 Long term (current) use of anticoagulants: Secondary | ICD-10-CM | POA: Diagnosis not present

## 2012-01-27 DIAGNOSIS — I10 Essential (primary) hypertension: Secondary | ICD-10-CM | POA: Diagnosis not present

## 2012-01-27 DIAGNOSIS — N184 Chronic kidney disease, stage 4 (severe): Secondary | ICD-10-CM | POA: Diagnosis not present

## 2012-01-27 DIAGNOSIS — M549 Dorsalgia, unspecified: Secondary | ICD-10-CM | POA: Diagnosis not present

## 2012-02-12 DIAGNOSIS — I10 Essential (primary) hypertension: Secondary | ICD-10-CM | POA: Diagnosis not present

## 2012-02-12 DIAGNOSIS — N2581 Secondary hyperparathyroidism of renal origin: Secondary | ICD-10-CM | POA: Diagnosis not present

## 2012-02-12 DIAGNOSIS — R5383 Other fatigue: Secondary | ICD-10-CM | POA: Diagnosis not present

## 2012-02-12 DIAGNOSIS — E119 Type 2 diabetes mellitus without complications: Secondary | ICD-10-CM | POA: Diagnosis not present

## 2012-02-12 DIAGNOSIS — N184 Chronic kidney disease, stage 4 (severe): Secondary | ICD-10-CM | POA: Diagnosis not present

## 2012-02-12 DIAGNOSIS — R5381 Other malaise: Secondary | ICD-10-CM | POA: Diagnosis not present

## 2012-02-16 DIAGNOSIS — H2510 Age-related nuclear cataract, unspecified eye: Secondary | ICD-10-CM | POA: Diagnosis not present

## 2012-02-16 DIAGNOSIS — H269 Unspecified cataract: Secondary | ICD-10-CM | POA: Diagnosis not present

## 2012-02-22 DIAGNOSIS — Z7901 Long term (current) use of anticoagulants: Secondary | ICD-10-CM | POA: Diagnosis not present

## 2012-02-22 DIAGNOSIS — I82409 Acute embolism and thrombosis of unspecified deep veins of unspecified lower extremity: Secondary | ICD-10-CM | POA: Diagnosis not present

## 2012-03-14 ENCOUNTER — Encounter: Payer: Self-pay | Admitting: Oncology

## 2012-03-14 NOTE — Progress Notes (Signed)
Faxed disability paper to [facility] @ [phone].

## 2012-03-15 ENCOUNTER — Inpatient Hospital Stay (HOSPITAL_COMMUNITY)
Admission: EM | Admit: 2012-03-15 | Discharge: 2012-03-18 | DRG: 313 | Disposition: A | Discharge status: Home / Self Care | Payer: Medicare Other | Attending: Cardiovascular Disease | Admitting: Cardiovascular Disease

## 2012-03-15 ENCOUNTER — Emergency Department (HOSPITAL_COMMUNITY): Payer: Medicare Other

## 2012-03-15 ENCOUNTER — Encounter (HOSPITAL_COMMUNITY): Payer: Self-pay | Admitting: Emergency Medicine

## 2012-03-15 DIAGNOSIS — E876 Hypokalemia: Secondary | ICD-10-CM | POA: Diagnosis not present

## 2012-03-15 DIAGNOSIS — Z860100 Personal history of colon polyps, unspecified: Secondary | ICD-10-CM

## 2012-03-15 DIAGNOSIS — I472 Ventricular tachycardia, unspecified: Secondary | ICD-10-CM | POA: Diagnosis not present

## 2012-03-15 DIAGNOSIS — N184 Chronic kidney disease, stage 4 (severe): Secondary | ICD-10-CM | POA: Diagnosis present

## 2012-03-15 DIAGNOSIS — E119 Type 2 diabetes mellitus without complications: Secondary | ICD-10-CM | POA: Diagnosis present

## 2012-03-15 DIAGNOSIS — I452 Bifascicular block: Secondary | ICD-10-CM | POA: Diagnosis present

## 2012-03-15 DIAGNOSIS — D649 Anemia, unspecified: Secondary | ICD-10-CM | POA: Diagnosis present

## 2012-03-15 DIAGNOSIS — C8589 Other specified types of non-Hodgkin lymphoma, extranodal and solid organ sites: Secondary | ICD-10-CM | POA: Diagnosis not present

## 2012-03-15 DIAGNOSIS — E11359 Type 2 diabetes mellitus with proliferative diabetic retinopathy without macular edema: Secondary | ICD-10-CM | POA: Diagnosis present

## 2012-03-15 DIAGNOSIS — I5030 Unspecified diastolic (congestive) heart failure: Secondary | ICD-10-CM | POA: Diagnosis present

## 2012-03-15 DIAGNOSIS — I4729 Other ventricular tachycardia: Secondary | ICD-10-CM | POA: Diagnosis not present

## 2012-03-15 DIAGNOSIS — E669 Obesity, unspecified: Secondary | ICD-10-CM | POA: Diagnosis present

## 2012-03-15 DIAGNOSIS — D6832 Hemorrhagic disorder due to extrinsic circulating anticoagulants: Secondary | ICD-10-CM | POA: Diagnosis present

## 2012-03-15 DIAGNOSIS — Z9119 Patient's noncompliance with other medical treatment and regimen: Secondary | ICD-10-CM

## 2012-03-15 DIAGNOSIS — I129 Hypertensive chronic kidney disease with stage 1 through stage 4 chronic kidney disease, or unspecified chronic kidney disease: Secondary | ICD-10-CM | POA: Diagnosis present

## 2012-03-15 DIAGNOSIS — R791 Abnormal coagulation profile: Secondary | ICD-10-CM | POA: Diagnosis not present

## 2012-03-15 DIAGNOSIS — Z6835 Body mass index (BMI) 35.0-35.9, adult: Secondary | ICD-10-CM

## 2012-03-15 DIAGNOSIS — R0789 Other chest pain: Secondary | ICD-10-CM | POA: Diagnosis not present

## 2012-03-15 DIAGNOSIS — R079 Chest pain, unspecified: Secondary | ICD-10-CM | POA: Diagnosis not present

## 2012-03-15 DIAGNOSIS — Z91199 Patient's noncompliance with other medical treatment and regimen due to unspecified reason: Secondary | ICD-10-CM

## 2012-03-15 DIAGNOSIS — I739 Peripheral vascular disease, unspecified: Secondary | ICD-10-CM | POA: Diagnosis present

## 2012-03-15 DIAGNOSIS — E1129 Type 2 diabetes mellitus with other diabetic kidney complication: Secondary | ICD-10-CM | POA: Diagnosis present

## 2012-03-15 DIAGNOSIS — Z86718 Personal history of other venous thrombosis and embolism: Secondary | ICD-10-CM

## 2012-03-15 DIAGNOSIS — E1139 Type 2 diabetes mellitus with other diabetic ophthalmic complication: Secondary | ICD-10-CM | POA: Diagnosis present

## 2012-03-15 DIAGNOSIS — Z794 Long term (current) use of insulin: Secondary | ICD-10-CM

## 2012-03-15 DIAGNOSIS — E1142 Type 2 diabetes mellitus with diabetic polyneuropathy: Secondary | ICD-10-CM | POA: Diagnosis present

## 2012-03-15 DIAGNOSIS — I509 Heart failure, unspecified: Secondary | ICD-10-CM | POA: Diagnosis present

## 2012-03-15 DIAGNOSIS — R0602 Shortness of breath: Secondary | ICD-10-CM | POA: Diagnosis not present

## 2012-03-15 DIAGNOSIS — E113599 Type 2 diabetes mellitus with proliferative diabetic retinopathy without macular edema, unspecified eye: Secondary | ICD-10-CM | POA: Diagnosis present

## 2012-03-15 DIAGNOSIS — N058 Unspecified nephritic syndrome with other morphologic changes: Secondary | ICD-10-CM | POA: Diagnosis present

## 2012-03-15 DIAGNOSIS — E785 Hyperlipidemia, unspecified: Secondary | ICD-10-CM | POA: Diagnosis present

## 2012-03-15 DIAGNOSIS — Z79899 Other long term (current) drug therapy: Secondary | ICD-10-CM

## 2012-03-15 DIAGNOSIS — Z9889 Other specified postprocedural states: Secondary | ICD-10-CM

## 2012-03-15 DIAGNOSIS — T45515A Adverse effect of anticoagulants, initial encounter: Secondary | ICD-10-CM | POA: Diagnosis not present

## 2012-03-15 DIAGNOSIS — E1149 Type 2 diabetes mellitus with other diabetic neurological complication: Secondary | ICD-10-CM | POA: Diagnosis present

## 2012-03-15 HISTORY — DX: Chronic kidney disease, stage 4 (severe): N18.4

## 2012-03-15 HISTORY — DX: Patient's noncompliance with other medical treatment and regimen: Z91.19

## 2012-03-15 HISTORY — DX: Family history of other specified conditions: Z84.89

## 2012-03-15 HISTORY — DX: Ventricular tachycardia: I47.2

## 2012-03-15 LAB — CBC WITH DIFFERENTIAL/PLATELET
Basophils Absolute: 0 10*3/uL (ref 0.0–0.1)
Basophils Relative: 0 % (ref 0–1)
Eosinophils Absolute: 0.1 10*3/uL (ref 0.0–0.7)
Eosinophils Relative: 1 % (ref 0–5)
HCT: 28.8 % — ABNORMAL LOW (ref 39.0–52.0)
Hemoglobin: 9.8 g/dL — ABNORMAL LOW (ref 13.0–17.0)
Lymphocytes Relative: 28 % (ref 12–46)
Lymphs Abs: 1.5 10*3/uL (ref 0.7–4.0)
MCH: 27.3 pg (ref 26.0–34.0)
MCHC: 34 g/dL (ref 30.0–36.0)
MCV: 80.2 fL (ref 78.0–100.0)
Monocytes Absolute: 0.3 10*3/uL (ref 0.1–1.0)
Monocytes Relative: 6 % (ref 3–12)
Neutro Abs: 3.6 10*3/uL (ref 1.7–7.7)
Neutrophils Relative %: 65 % (ref 43–77)
Platelets: 214 10*3/uL (ref 150–400)
RBC: 3.59 MIL/uL — ABNORMAL LOW (ref 4.22–5.81)
RDW: 15 % (ref 11.5–15.5)
WBC: 5.6 10*3/uL (ref 4.0–10.5)

## 2012-03-15 LAB — BASIC METABOLIC PANEL
BUN: 42 mg/dL — ABNORMAL HIGH (ref 6–23)
CO2: 26 mEq/L (ref 19–32)
Calcium: 9.4 mg/dL (ref 8.4–10.5)
Chloride: 91 mEq/L — ABNORMAL LOW (ref 96–112)
Creatinine, Ser: 2.9 mg/dL — ABNORMAL HIGH (ref 0.50–1.35)
GFR calc Af Amer: 26 mL/min — ABNORMAL LOW (ref >90)
GFR calc non Af Amer: 22 mL/min — ABNORMAL LOW (ref >90)
Glucose, Bld: 568 mg/dL (ref 70–99)
Potassium: 4.2 mEq/L (ref 3.5–5.1)
Sodium: 129 mEq/L — ABNORMAL LOW (ref 135–145)

## 2012-03-15 LAB — POCT I-STAT TROPONIN I: Troponin i, poc: 0.02 ng/mL (ref 0.00–0.08)

## 2012-03-15 LAB — GLUCOSE, CAPILLARY
Glucose-Capillary: 519 mg/dL — ABNORMAL HIGH (ref 70–99)
Glucose-Capillary: 488 mg/dL — ABNORMAL HIGH (ref 70–99)
Glucose-Capillary: 364 mg/dL — ABNORMAL HIGH (ref 70–99)

## 2012-03-15 LAB — PROTIME-INR
INR: 1.11 (ref 0.00–1.49)
Prothrombin Time: 14.2 seconds (ref 11.6–15.2)

## 2012-03-15 LAB — PRO B NATRIURETIC PEPTIDE: Pro B Natriuretic peptide (BNP): 2217 pg/mL — ABNORMAL HIGH (ref 0–125)

## 2012-03-15 LAB — TROPONIN I: Troponin I: <0.3 ng/mL (ref <0.30)

## 2012-03-15 MED ORDER — CALCITRIOL 0.25 MCG PO CAPS
0.2500 ug | ORAL_CAPSULE | Freq: Every day | ORAL | Status: DC
  Administered 2012-03-16: 0.25 ug via ORAL
  Filled 2012-03-15: qty 1

## 2012-03-15 MED ORDER — ASPIRIN 81 MG PO CHEW
324.0000 mg | CHEWABLE_TABLET | Freq: Once | ORAL | Status: AC
  Administered 2012-03-15: 324 mg via ORAL
  Filled 2012-03-15: qty 4

## 2012-03-15 MED ORDER — SODIUM CHLORIDE 0.9 % IV SOLN
INTRAVENOUS | Status: DC
  Administered 2012-03-15 – 2012-03-16 (×2): via INTRAVENOUS

## 2012-03-15 MED ORDER — WARFARIN SODIUM 7.5 MG PO TABS
15.0000 mg | ORAL_TABLET | Freq: Once | ORAL | Status: AC
  Administered 2012-03-15: 15 mg via ORAL
  Filled 2012-03-15: qty 2

## 2012-03-15 MED ORDER — INSULIN GLARGINE 100 UNIT/ML ~~LOC~~ SOLN
50.0000 [IU] | Freq: Every day | SUBCUTANEOUS | Status: DC
  Administered 2012-03-15 – 2012-03-17 (×3): 50 [IU] via SUBCUTANEOUS
  Filled 2012-03-15: qty 1

## 2012-03-15 MED ORDER — GATIFLOXACIN 0.5 % OP SOLN
1.0000 [drp] | Freq: Three times a day (TID) | OPHTHALMIC | Status: DC
  Administered 2012-03-15 – 2012-03-18 (×8): 1 [drp] via OPHTHALMIC
  Filled 2012-03-15 (×2): qty 2.5

## 2012-03-15 MED ORDER — WARFARIN - PHARMACIST DOSING INPATIENT: Freq: Every day | Status: DC

## 2012-03-15 MED ORDER — DEXTROSE 50 % IV SOLN: 25.0000 mL | INTRAVENOUS | Status: DC | PRN

## 2012-03-15 MED ORDER — FUROSEMIDE 80 MG PO TABS
80.0000 mg | ORAL_TABLET | Freq: Two times a day (BID) | ORAL | Status: DC
  Administered 2012-03-15 – 2012-03-18 (×6): 80 mg via ORAL
  Filled 2012-03-15 (×13): qty 1

## 2012-03-15 MED ORDER — POLYSACCHARIDE IRON COMPLEX 150 MG PO CAPS
150.0000 mg | ORAL_CAPSULE | Freq: Every day | ORAL | Status: DC
  Administered 2012-03-16 – 2012-03-18 (×3): 150 mg via ORAL
  Filled 2012-03-15 (×3): qty 1

## 2012-03-15 MED ORDER — DEXTROSE-NACL 5-0.45 % IV SOLN: INTRAVENOUS | Status: DC

## 2012-03-15 MED ORDER — ENOXAPARIN SODIUM 150 MG/ML ~~LOC~~ SOLN
140.0000 mg | SUBCUTANEOUS | Status: AC
  Administered 2012-03-15: 140 mg via SUBCUTANEOUS
  Filled 2012-03-15: qty 1

## 2012-03-15 MED ORDER — INSULIN ASPART 100 UNIT/ML ~~LOC~~ SOLN
0.0000 [IU] | SUBCUTANEOUS | Status: DC
Start: 2012-03-15 — End: 2012-03-19
  Administered 2012-03-15: 11 [IU] via SUBCUTANEOUS
  Administered 2012-03-16: 15 [IU] via SUBCUTANEOUS
  Administered 2012-03-16 (×2): 2 [IU] via SUBCUTANEOUS
  Administered 2012-03-16: 3 [IU] via SUBCUTANEOUS
  Administered 2012-03-17 (×2): 5 [IU] via SUBCUTANEOUS
  Administered 2012-03-17: 8 [IU] via SUBCUTANEOUS
  Administered 2012-03-18: 2 [IU] via SUBCUTANEOUS
  Administered 2012-03-18: 5 [IU] via SUBCUTANEOUS
  Administered 2012-03-18: 8 [IU] via SUBCUTANEOUS
  Administered 2012-03-18: 2 [IU] via SUBCUTANEOUS

## 2012-03-15 MED ORDER — WARFARIN SODIUM 2.5 MG PO TABS: 12.5000 mg | ORAL_TABLET | Freq: Once | ORAL | Status: DC

## 2012-03-15 MED ORDER — INSULIN REGULAR BOLUS VIA INFUSION
0.0000 [IU] | Freq: Three times a day (TID) | INTRAVENOUS | Status: DC
  Filled 2012-03-15: qty 10

## 2012-03-15 MED ORDER — NITROGLYCERIN 2 % TD OINT
1.0000 [in_us] | TOPICAL_OINTMENT | Freq: Once | TRANSDERMAL | Status: AC
  Administered 2012-03-15: 1 [in_us] via TOPICAL
  Filled 2012-03-15: qty 30

## 2012-03-15 MED ORDER — ONDANSETRON HCL 4 MG/2ML IJ SOLN: 4.0000 mg | Freq: Four times a day (QID) | INTRAMUSCULAR | Status: DC | PRN

## 2012-03-15 MED ORDER — ZOLPIDEM TARTRATE 5 MG PO TABS: 5.0000 mg | ORAL_TABLET | Freq: Every evening | ORAL | Status: DC | PRN

## 2012-03-15 MED ORDER — ACETAMINOPHEN 500 MG PO TABS
1000.0000 mg | ORAL_TABLET | Freq: Four times a day (QID) | ORAL | Status: DC | PRN
  Administered 2012-03-16 – 2012-03-18 (×3): 1000 mg via ORAL
  Filled 2012-03-15 (×4): qty 2

## 2012-03-15 MED ORDER — SODIUM CHLORIDE 0.9 % IV SOLN
INTRAVENOUS | Status: DC
  Administered 2012-03-15: 19:00:00 via INTRAVENOUS

## 2012-03-15 MED ORDER — ALUM & MAG HYDROXIDE-SIMETH 200-200-20 MG/5ML PO SUSP: 30.0000 mL | Freq: Four times a day (QID) | ORAL | Status: DC | PRN

## 2012-03-15 MED ORDER — SODIUM CHLORIDE 0.9 % IJ SOLN
3.0000 mL | Freq: Two times a day (BID) | INTRAMUSCULAR | Status: DC
  Administered 2012-03-15 – 2012-03-18 (×4): 3 mL via INTRAVENOUS

## 2012-03-15 MED ORDER — ONDANSETRON HCL 4 MG PO TABS: 4.0000 mg | ORAL_TABLET | Freq: Four times a day (QID) | ORAL | Status: DC | PRN

## 2012-03-15 MED ORDER — DOCUSATE SODIUM 100 MG PO CAPS
100.0000 mg | ORAL_CAPSULE | Freq: Two times a day (BID) | ORAL | Status: DC
  Administered 2012-03-15 – 2012-03-18 (×5): 100 mg via ORAL
  Filled 2012-03-15 (×10): qty 1

## 2012-03-15 MED ORDER — ASPIRIN EC 81 MG PO TBEC
81.0000 mg | DELAYED_RELEASE_TABLET | Freq: Every day | ORAL | Status: DC
  Administered 2012-03-15 – 2012-03-18 (×4): 81 mg via ORAL
  Filled 2012-03-15 (×5): qty 1

## 2012-03-15 MED ORDER — WARFARIN SODIUM 7.5 MG PO TABS
15.0000 mg | ORAL_TABLET | Freq: Once | ORAL | Status: DC
  Filled 2012-03-15: qty 2

## 2012-03-15 MED ORDER — ENOXAPARIN SODIUM 150 MG/ML ~~LOC~~ SOLN
140.0000 mg | SUBCUTANEOUS | Status: DC
  Filled 2012-03-15: qty 1

## 2012-03-15 MED ORDER — AMLODIPINE BESYLATE 5 MG PO TABS
5.0000 mg | ORAL_TABLET | Freq: Every day | ORAL | Status: DC
  Administered 2012-03-16 – 2012-03-18 (×3): 5 mg via ORAL
  Filled 2012-03-15 (×4): qty 1

## 2012-03-15 MED ORDER — SODIUM CHLORIDE 0.9 % IV SOLN
INTRAVENOUS | Status: DC
  Administered 2012-03-15: 4.6 [IU]/h via INTRAVENOUS
  Filled 2012-03-15: qty 1

## 2012-03-15 MED ORDER — CARVEDILOL 25 MG PO TABS
25.0000 mg | ORAL_TABLET | Freq: Two times a day (BID) | ORAL | Status: DC
  Administered 2012-03-15 – 2012-03-18 (×7): 25 mg via ORAL
  Filled 2012-03-15 (×13): qty 1

## 2012-03-15 NOTE — ED Notes (Signed)
CRITICAL VALUE ALERT  Critical value received:  Glucose 568  Date of notification:  03/15/2012  Time of notification:  1822  Critical value read back: yes  Nurse who received alert:  Renita Papa, RN  MD notified (1st page):  Kandis Mannan  Time of first page:  1825, face-to-face  Responding MD: Kandis Mannan  Time MD responded:  [phone]

## 2012-03-15 NOTE — ED Notes (Signed)
Per pharmacist, hold Lovenox till lab results-pharmacist will inform RN

## 2012-03-15 NOTE — Progress Notes (Signed)
ANTICOAGULATION CONSULT NOTE - Initial Consult  Pharmacy Consult for Lovenox, Warfarin Indication: Hx of DVT, r/o PE  No Known Allergies  Patient Measurements: Weight: 298 lb (135.172 kg)  Vital Signs: Temp: 97.5 F (36.4 C) (10/15 1714) Temp src: Oral (10/15 1714) BP: 163/96 mmHg (10/15 1714) Pulse Rate: 82  (10/15 1714)  Labs:  Basename 03/15/12 1735  HGB 9.8*  HCT 28.8*  PLT 214  APTT --  LABPROT 14.2  INR 1.11  HEPARINUNFRC --  CREATININE 2.90*  CKTOTAL --  CKMB --  TROPONINI --    The CrCl is unknown because both a height and weight (above a minimum accepted value) are required for this calculation.   Medical History: Past Medical History  Diagnosis Date  . Cancer   . Diabetes mellitus   . DVT (deep venous thrombosis)     Right leg  . Diabetic retinopathy(362.0)   . Hypertension   . Hyperlipidemia   . Non Hodgkin's lymphoma     Tx 2009  . History of cardiac catheterization   . Nodular lymphoma of intra-abdominal lymph nodes   . Shortness of breath   . Sleep apnea     "suppose to have a sleep studfy, but they never told me when.  . Peripheral vascular disease   . Blood transfusion   . 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  Marland Kitchen Headache     Medications:  Scheduled:     . aspirin  324 mg Oral Once  . enoxaparin (LOVENOX) injection  140 mg Subcutaneous NOW  . insulin regular  0-10 Units Intravenous TID WC  . nitroGLYCERIN  1 inch Topical Once  . Warfarin - Pharmacist Dosing Inpatient   Does not apply q1800   Infusions:    . sodium chloride 150 mL/hr at 03/15/12 1848  . dextrose 5 % and 0.45% NaCl    . insulin (NOVOLIN-R) infusion      Assessment:  61 YOM admit 10/15 with suspicion for PE.  Currently not a candidate for CTA d/t renal insufficiency.  Lovenox dosing per pharmacy, continuation of warfarin, emipirically for PE.  Warfarin PTA for hx of DVT.  INR is subtherapeutic on admission at 1.11.  Pt reports taking warfarin  10mg  daily, except 12.5mg  on Mondays and Fridays.  Last dose taken 10/14.  Hx of NHL, diffuse large cell lymphoma.  Hgb 9.8 (appears near baseline), and Plt 214  SCr 2.9 is near baseline.  CrCl (n) ~ 28 ml/min  Goal of Therapy:  INR 2-3 Anti-Xa level 0.6-1.2 units/ml 4hrs after LMWH dose given Monitor platelets by anticoagulation protocol: Yes   Plan:   Lovenox 140mg  SubQ x1 dose now, then q24h  Boosted warfarin dose 15 mg PO tonight  INR daily  Follow renal function and CBC while on Lovenox   Gretta Arab PharmD, BCPS Pager [phone] 03/15/2012 7:35 PM

## 2012-03-15 NOTE — ED Notes (Signed)
EKG was given to Dr. Lita Mains.

## 2012-03-15 NOTE — ED Provider Notes (Signed)
History     CSN: [id number]  Arrival date & time 03/15/12  1651   First MD Initiated Contact with Patient 03/15/12 1828      Chief Complaint  Patient presents with  . Chest Pain  . Shortness of Breath    (Consider location/radiation/quality/duration/timing/severity/associated sxs/prior treatment) HPI Complains of anterior chest pain intermittent onset 3 days ago left-sided sometimes at right side anterior chest pain last 10-15 minutes at a time not made better or worse by anything sometimes associated with shortness of breath presently asymptomatic admits to noncompliance with insulin warfarin and blood pressure medicines for the past several days. He is asymptomatic as I examine him no treatment prior to coming here Past Medical History  Diagnosis Date  . Cancer   . Diabetes mellitus   . DVT (deep venous thrombosis)     Right leg  . Diabetic retinopathy(362.0)   . Hypertension   . Hyperlipidemia   . Non Hodgkin's lymphoma     Tx 2009  . History of cardiac catheterization   . Nodular lymphoma of intra-abdominal lymph nodes   . Shortness of breath   . Sleep apnea     "suppose to have a sleep studfy, but they never told me when.  . Peripheral vascular disease   . Blood transfusion   . 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  Marland Kitchen Headache     Past Surgical History  Procedure Date  . Cardiac catheterization   . Porta catheter   . Porta catheter insertion   . Porta catheter removed   . Coloscopy   . Pars plana vitrectomy 08/27/2011    Procedure: PARS PLANA VITRECTOMY WITH 25 GAUGE;  Surgeon: Hayden Pedro, MD;  Location: [facility];  Service: Ophthalmology;  Laterality: Left;  Repair of complex traction retinal detachment left eye    Family History  Problem Relation Age of Onset  . Anesthesia problems Son     History  Substance Use Topics  . Smoking status: Never Smoker   . Smokeless tobacco: Never Used  . Alcohol Use: No      Review of Systems    Constitutional: Negative.   HENT: Negative.   Respiratory: Positive for shortness of breath.   Cardiovascular: Positive for chest pain.  Gastrointestinal: Negative.   Musculoskeletal: Negative.   Skin: Negative.   Neurological: Negative.   Hematological: Negative.   Psychiatric/Behavioral: Negative.   All other systems reviewed and are negative.    Allergies  Review of patient's allergies indicates no known allergies.  Home Medications   Current Outpatient Rx  Name Route Sig Dispense Refill  . ACETAMINOPHEN 500 MG PO TABS Oral Take 1,000 mg by mouth every 6 (six) hours as needed. For headache     . AMLODIPINE BESYLATE 5 MG PO TABS Oral Take 1 tablet (5 mg total) by mouth daily. 30 tablet 6  . CALCITRIOL 0.25 MCG PO CAPS Oral Take 0.25 mcg by mouth daily.      Marland Kitchen CARVEDILOL 25 MG PO TABS Oral Take 25 mg by mouth 2 (two) times daily with a meal.    . FUROSEMIDE 80 MG PO TABS Oral Take 1 tablet (80 mg total) by mouth 2 (two) times daily. 60 tablet 5  . GATIFLOXACIN 0.5 % OP SOLN Left Eye Place 1 drop into the left eye 3 (three) times daily.    . INSULIN GLARGINE 100 UNIT/ML [place] SOLN Subcutaneous Inject 50 Units into the skin at bedtime. 10 mL  12  . INSULIN LISPRO (HUMAN) 100 UNIT/ML [place] SOLN Subcutaneous Inject 60 Units into the skin 2 (two) times daily.     Marland Kitchen POLYSACCHARIDE IRON 150 MG PO CAPS Oral Take 1 capsule (150 mg total) by mouth daily. 30 each 2  . WARFARIN SODIUM 5 MG PO TABS Oral Take 10-12.5 mg by mouth daily. 2 tab daily except for 2.5 tabs daily on Mondays and Fridays.      BP 163/96  Pulse 82  Temp 97.5 F (36.4 C) (Oral)  Resp 15  SpO2 100%  Physical Exam  Nursing note and vitals reviewed. Constitutional: He appears well-developed and well-nourished.  HENT:  Head: Normocephalic and atraumatic.  Eyes: Conjunctivae normal are normal. Pupils are equal, round, and reactive to light.  Neck: Neck supple. No tracheal deviation present. No thyromegaly present.   Cardiovascular: Normal rate and regular rhythm.   No murmur heard. Pulmonary/Chest: Effort normal and breath sounds normal.  Abdominal: Soft. Bowel sounds are normal. He exhibits no distension. There is no tenderness.       Obese  Musculoskeletal: Normal range of motion. He exhibits no edema and no tenderness.  Neurological: He is alert. Coordination normal.  Skin: Skin is warm and dry. No rash noted.  Psychiatric: He has a normal mood and affect.    ED Course  Procedures (including critical care time)  Labs Reviewed  CBC WITH DIFFERENTIAL - Abnormal; Notable for the following:    RBC 3.59 (*)     Hemoglobin 9.8 (*)     HCT 28.8 (*)     All other components within normal limits  BASIC METABOLIC PANEL - Abnormal; Notable for the following:    Sodium 129 (*)     Chloride 91 (*)     Glucose, Bld 568 (*)     BUN 42 (*)     Creatinine, Ser 2.90 (*)     GFR calc non Af Amer 22 (*)     GFR calc Af Amer 26 (*)     All other components within normal limits  PRO B NATRIURETIC PEPTIDE - Abnormal; Notable for the following:    Pro B Natriuretic peptide (BNP) 2217.0 (*)     All other components within normal limits  POCT I-STAT TROPONIN I  PROTIME-INR   Dg Chest 2 View  03/15/2012  *RADIOLOGY REPORT*  Clinical Data: 61 year old male with chest pain and shortness of breath.  CHEST - 2 VIEW  Comparison: 08/27/2011 and prior chest radiographs  Findings: The cardiomediastinal silhouette is unremarkable. The lungs are clear. There is no evidence of focal airspace disease, pulmonary edema, suspicious pulmonary nodule/mass, pleural effusion, or pneumothorax. No acute bony abnormalities are identified.  IMPRESSION: No evidence of acute cardiopulmonary disease.   Original Report Authenticated By: Lura Em, [name]      No diagnosis found.    Date: 03/15/2012  Rate: 85  Rhythm: normal sinus rhythm  QRS Axis: left  Intervals: normal  ST/T Wave abnormalities: nonspecific T wave changes   Conduction Disutrbances:right bundle branch block and left anterior fascicular block  Narrative Interpretation:   Old EKG Reviewed: No significant change from 05/22/1999 as interpreted by me   Chest x-ray reviewed by me Results for orders placed during the hospital encounter of 03/15/12  CBC WITH DIFFERENTIAL      Component Value Range   WBC 5.6  4.0 - 10.5 K/uL   RBC 3.59 (*) 4.22 - 5.81 MIL/uL   Hemoglobin 9.8 (*) 13.0 - 17.0 g/dL  HCT 28.8 (*) 39.0 - 52.0 %   MCV 80.2  78.0 - 100.0 fL   MCH 27.3  26.0 - 34.0 pg   MCHC 34.0  30.0 - 36.0 g/dL   RDW 15.0  11.5 - 15.5 %   Platelets 214  150 - 400 K/uL   Neutrophils Relative 65  43 - 77 %   Neutro Abs 3.6  1.7 - 7.7 K/uL   Lymphocytes Relative 28  12 - 46 %   Lymphs Abs 1.5  0.7 - 4.0 K/uL   Monocytes Relative 6  3 - 12 %   Monocytes Absolute 0.3  0.1 - 1.0 K/uL   Eosinophils Relative 1  0 - 5 %   Eosinophils Absolute 0.1  0.0 - 0.7 K/uL   Basophils Relative 0  0 - 1 %   Basophils Absolute 0.0  0.0 - 0.1 K/uL  BASIC METABOLIC PANEL      Component Value Range   Sodium 129 (*) 135 - 145 mEq/L   Potassium 4.2  3.5 - 5.1 mEq/L   Chloride 91 (*) 96 - 112 mEq/L   CO2 26  19 - 32 mEq/L   Glucose, Bld 568 (*) 70 - 99 mg/dL   BUN 42 (*) 6 - 23 mg/dL   Creatinine, Ser 2.90 (*) 0.50 - 1.35 mg/dL   Calcium 9.4  8.4 - 10.5 mg/dL   GFR calc non Af Amer 22 (*) >90 mL/min   GFR calc Af Amer 26 (*) >90 mL/min  PRO B NATRIURETIC PEPTIDE      Component Value Range   Pro B Natriuretic peptide (BNP) 2217.0 (*) 0 - 125 pg/mL  POCT I-STAT TROPONIN I      Component Value Range   Troponin i, poc 0.02  0.00 - 0.08 ng/mL   Comment 3           PROTIME-INR      Component Value Range   Prothrombin Time 14.2  11.6 - 15.2 seconds   INR 1.11  0.00 - 1.49  GLUCOSE, CAPILLARY      Component Value Range   Glucose-Capillary 519 (*) 70 - 99 mg/dL   Comment 1 Documented in Chart     Comment 2 Notify RN     Dg Chest 2 View  03/15/2012  *RADIOLOGY  REPORT*  Clinical Data: 61 year old male with chest pain and shortness of breath.  CHEST - 2 VIEW  Comparison: 08/27/2011 and prior chest radiographs  Findings: The cardiomediastinal silhouette is unremarkable. The lungs are clear. There is no evidence of focal airspace disease, pulmonary edema, suspicious pulmonary nodule/mass, pleural effusion, or pneumothorax. No acute bony abnormalities are identified.  IMPRESSION: No evidence of acute cardiopulmonary disease.   Original Report Authenticated By: Lura Em, [name]     MDM  Pretest clinical suspicion for pulmonary embolism is low given patient's history.Marland Kitchen symptoms were concerning for acute coronary syndrome We'll treat empirically with Lovenox and aspirin. Spoke with Dr.Holwerda prefers to try a hospitalist for admission Spoke with Dr.Oti Plan telemetry, 23 hour observation, nitrates Lovenox aspirin restart Coumadin, glycemic control Diagnosis #1 chest pain #2 hypertension #3 anemia #4 hyperglycemia #5 medication noncompliance   7:30 PM addendum Dr.Holwerda called me back,. After discussion with Dr.Oti, Dr Ardeth Perfect will arrange for inhouse observation       Orlie Dakin, MD 03/15/12 1935

## 2012-03-15 NOTE — ED Notes (Addendum)
Pt present with c/o chest pain last night, SOB.  Pt took indisgestion meds due to hx indigestion but pt reports no relief.  Pt reports pain began to worsen today.  Pt reports feeling light headed today. Pt denies n/v.

## 2012-03-15 NOTE — H&P (Signed)
Physician Admission History and Physical     PCP:   Geoffery Lyons, MD   Chief Complaint:  Chest pain  HPI: [name] is an 61 y.o. male.  Pt presents after having substernal to L sided CP. It has been on and off over the past 24 hours. It is sharp and located substernal to L breast. No radiation to arm or jaw. Is not a/w N/V, diaphoresis. He admits to missing all of his meds this weekend. He does not take ASA at home while on coumadin. EKG in ED showed no change from prior. First trop was neg. Pt does state it was relieved w/ GERD medication and he has been suffering from GERD.   Review of Systems:  Neg except as noted above   Past Medical History (reviewed - no changes required):  Atypical chest pain with (-) stress test , cardiac cath 99991111 DM2 HTN Diastolic heart failure  Hyperlipidemia Obesity ct chest/angio 2011 neg Left toe ulceration (2008) NHL Adenomatous colon polyps Retinopathy Nephropathy rle dvt 2008, 2011 Surgical History (reviewed - no changes required):   Left toe debridement S/P debridement of right lower extremity DVT cardiac cath 2010 colon 2010 Renal US 8/12 Family History (reviewed - no changes required):  No Family history of colon cancer, lymphomas, blood disorders, or leukemias.  Father: Diabetes Social History (reviewed - no changes required):  Patient is married with one son. He drives a truck locally. The patient denies tobacco, alcohol, and illicit drug usage.  MEDS  coumadin 12.5mg  on Monday and Friday and 10mg  other days flexeril 10mg  BID 75/25 humalog 80U BID lantus 60U qhs  lasix 80mg  BID  norvasc 5mg  QD coreg 25mg  BID    Allergies:  No Known Allergies  Physical Exam: Filed Vitals:   03/15/12 1714 03/15/12 1841  BP: 163/96   Pulse: 82   Temp: 97.5 F (36.4 C)   TempSrc: Oral   Resp: 15   Weight:  298 lb (135.172 kg)  SpO2: 100%    General appearance: obese in NAD  Head: Normocephalic, without obvious  abnormality, atraumatic Eyes: conjunctivae/corneas clear. No icterus  Nose: Nares normal. Septum midline. Mucosa normal. No drainage or sinus tenderness. Throat: lips, mucosa, and tongue normal; teeth and gums normal Neck: no adenopathy, no carotid bruit, no JVD and thyroid not enlarged, symmetric, no tenderness/mass/nodules Resp: diminished breath sounds  But no appreciable rales or wheezes   Cardio: RRR, no MRG  GI: soft, non-tender; bowel sounds normal; no masses,  no organomegaly Extremities: extremities normal, atraumatic, no cyanosis . 1+ pitting edema B/L  Pulses: 2+ and symmetric Lymph nodes: Cervical adenopathy: no cervical lymphadenopathy Neurologic: Alert and oriented X 3, normal strength and tone. Normal symmetric reflexes.     Labs on Admission:   [facility] 03/15/12 1735  NA 129*  K 4.2  CL 91*  CO2 26  GLUCOSE 568*  BUN 42*  CREATININE 2.90*  CALCIUM 9.4  MG --  PHOS --    Basename 03/15/12 1735  WBC 5.6  NEUTROABS 3.6  HGB 9.8*  HCT 28.8*  MCV 80.2  PLT 214   No results found for this basename: CKTOTAL:3,CKMB:3,CKMBINDEX:3,TROPONINI:3 in the last 72 hours Lab Results  Component Value Date   INR 1.11 03/15/2012   INR 1.93* 05/25/2011   INR 2.10* 05/24/2011    Radiological Exams on Admission: Dg Chest 2 View  03/15/2012  *RADIOLOGY REPORT*  Clinical Data: 61 year old male with chest pain and shortness of breath.  CHEST - 2  VIEW  Comparison: 08/27/2011 and prior chest radiographs  Findings: The cardiomediastinal silhouette is unremarkable. The lungs are clear. There is no evidence of focal airspace disease, pulmonary edema, suspicious pulmonary nodule/mass, pleural effusion, or pneumothorax. No acute bony abnormalities are identified.  IMPRESSION: No evidence of acute cardiopulmonary disease.   Original Report Authenticated By: Lura Em, [name]    Assessment/Plan  Chest pain   - r/o ACS. Could be PE but therapy would not change given on chronic  anticoag, so no need to risk kidney function for CTA PE at this time. Could also be noncardiac  - tele   - cycle enzymes x 3   - therapeutic anticoag for h/o DVTs, with lovenox 1mg /kg daily given CKD   - ASA 325 OTO in ED. reeval in AM for need of continued ASA dosage based on trop trend . Ordered ASA 81 daily for now, may readdress   - EKG showed no acute changes in ED   - no ACE-I given CKD   - on coreg as BB    DM, type 2   - h/o of prior noncompliance and has elevated FSBS on admission w/o gap or acidosis  - FSBS very elevated but gap closed and HCO3 normal . Missed his insulin after dinner, so noncompliance likely reason  - resume home insulin regimen of lantus but holding his 75/25 for now given NPO. May restart once diet resumes . SSI overnight   - mod carb diet   CKD   - cont home meds   - avoid nephrotoxins  - dose lovenox accordingly   - calcitonin home med    Diastolic heart failure   - use caution with fluids . Hydrate x 6 hours via IV then PO only   - cont home BB, lasix 80 BID   H/o DVT   - INR 1.1   - lovenox bridge w/ coumadin per pharmacy protocol   - home coumadin dose listed above   - goal INR 2-3 . Pt appears noncompliant   HTN   - cont home meds   PPx   - lovenox   - maalox for GERD   FEN   - NPO until rules out then cardiac low carb diet   - NS x 6 hours then SLIV    Hershey Knauer 03/15/2012, 7:30 PM

## 2012-03-15 NOTE — Progress Notes (Signed)
Patient was complaining of a headache from the nitroglycerin ointment. MD, [facility], was paged and told the RN to remove the patch/ointment. Patient is now asleep, resting in bed.

## 2012-03-16 ENCOUNTER — Encounter (HOSPITAL_COMMUNITY): Payer: Self-pay | Admitting: General Practice

## 2012-03-16 DIAGNOSIS — C8589 Other specified types of non-Hodgkin lymphoma, extranodal and solid organ sites: Secondary | ICD-10-CM | POA: Diagnosis present

## 2012-03-16 DIAGNOSIS — E1139 Type 2 diabetes mellitus with other diabetic ophthalmic complication: Secondary | ICD-10-CM | POA: Diagnosis present

## 2012-03-16 DIAGNOSIS — R0789 Other chest pain: Secondary | ICD-10-CM | POA: Diagnosis not present

## 2012-03-16 DIAGNOSIS — Z79899 Other long term (current) drug therapy: Secondary | ICD-10-CM | POA: Diagnosis not present

## 2012-03-16 DIAGNOSIS — I4729 Other ventricular tachycardia: Secondary | ICD-10-CM | POA: Diagnosis not present

## 2012-03-16 DIAGNOSIS — E669 Obesity, unspecified: Secondary | ICD-10-CM | POA: Diagnosis present

## 2012-03-16 DIAGNOSIS — E876 Hypokalemia: Secondary | ICD-10-CM | POA: Diagnosis not present

## 2012-03-16 DIAGNOSIS — N058 Unspecified nephritic syndrome with other morphologic changes: Secondary | ICD-10-CM | POA: Diagnosis present

## 2012-03-16 DIAGNOSIS — Z86718 Personal history of other venous thrombosis and embolism: Secondary | ICD-10-CM | POA: Diagnosis not present

## 2012-03-16 DIAGNOSIS — N189 Chronic kidney disease, unspecified: Secondary | ICD-10-CM | POA: Diagnosis not present

## 2012-03-16 DIAGNOSIS — E11359 Type 2 diabetes mellitus with proliferative diabetic retinopathy without macular edema: Secondary | ICD-10-CM | POA: Diagnosis present

## 2012-03-16 DIAGNOSIS — Z9119 Patient's noncompliance with other medical treatment and regimen: Secondary | ICD-10-CM

## 2012-03-16 DIAGNOSIS — I5030 Unspecified diastolic (congestive) heart failure: Secondary | ICD-10-CM | POA: Diagnosis present

## 2012-03-16 DIAGNOSIS — I452 Bifascicular block: Secondary | ICD-10-CM | POA: Diagnosis present

## 2012-03-16 DIAGNOSIS — E785 Hyperlipidemia, unspecified: Secondary | ICD-10-CM | POA: Diagnosis present

## 2012-03-16 DIAGNOSIS — E1149 Type 2 diabetes mellitus with other diabetic neurological complication: Secondary | ICD-10-CM | POA: Diagnosis present

## 2012-03-16 DIAGNOSIS — E1129 Type 2 diabetes mellitus with other diabetic kidney complication: Secondary | ICD-10-CM | POA: Diagnosis present

## 2012-03-16 DIAGNOSIS — E119 Type 2 diabetes mellitus without complications: Secondary | ICD-10-CM | POA: Diagnosis not present

## 2012-03-16 DIAGNOSIS — Z91199 Patient's noncompliance with other medical treatment and regimen due to unspecified reason: Secondary | ICD-10-CM | POA: Diagnosis not present

## 2012-03-16 DIAGNOSIS — I739 Peripheral vascular disease, unspecified: Secondary | ICD-10-CM | POA: Diagnosis present

## 2012-03-16 DIAGNOSIS — Z794 Long term (current) use of insulin: Secondary | ICD-10-CM | POA: Diagnosis not present

## 2012-03-16 DIAGNOSIS — D649 Anemia, unspecified: Secondary | ICD-10-CM | POA: Diagnosis present

## 2012-03-16 DIAGNOSIS — I509 Heart failure, unspecified: Secondary | ICD-10-CM | POA: Diagnosis present

## 2012-03-16 DIAGNOSIS — Z86010 Personal history of colonic polyps: Secondary | ICD-10-CM | POA: Diagnosis not present

## 2012-03-16 DIAGNOSIS — I129 Hypertensive chronic kidney disease with stage 1 through stage 4 chronic kidney disease, or unspecified chronic kidney disease: Secondary | ICD-10-CM | POA: Diagnosis present

## 2012-03-16 DIAGNOSIS — R791 Abnormal coagulation profile: Secondary | ICD-10-CM | POA: Diagnosis not present

## 2012-03-16 DIAGNOSIS — N184 Chronic kidney disease, stage 4 (severe): Secondary | ICD-10-CM | POA: Diagnosis not present

## 2012-03-16 DIAGNOSIS — R079 Chest pain, unspecified: Secondary | ICD-10-CM | POA: Diagnosis not present

## 2012-03-16 DIAGNOSIS — E1142 Type 2 diabetes mellitus with diabetic polyneuropathy: Secondary | ICD-10-CM | POA: Diagnosis present

## 2012-03-16 HISTORY — DX: Patient's noncompliance with other medical treatment and regimen: Z91.19

## 2012-03-16 HISTORY — DX: Patient's noncompliance with other medical treatment and regimen due to unspecified reason: Z91.199

## 2012-03-16 LAB — GLUCOSE, CAPILLARY
Glucose-Capillary: 126 mg/dL — ABNORMAL HIGH (ref 70–99)
Glucose-Capillary: 156 mg/dL — ABNORMAL HIGH (ref 70–99)
Glucose-Capillary: 200 mg/dL — ABNORMAL HIGH (ref 70–99)
Glucose-Capillary: 296 mg/dL — ABNORMAL HIGH (ref 70–99)
Glucose-Capillary: 304 mg/dL — ABNORMAL HIGH (ref 70–99)
Glucose-Capillary: 358 mg/dL — ABNORMAL HIGH (ref 70–99)

## 2012-03-16 LAB — CBC
HCT: 29.4 % — ABNORMAL LOW (ref 39.0–52.0)
Hemoglobin: 9.9 g/dL — ABNORMAL LOW (ref 13.0–17.0)
MCH: 26.9 pg (ref 26.0–34.0)
MCHC: 33.7 g/dL (ref 30.0–36.0)
MCV: 79.9 fL (ref 78.0–100.0)
Platelets: 216 10*3/uL (ref 150–400)
RBC: 3.68 MIL/uL — ABNORMAL LOW (ref 4.22–5.81)
RDW: 15 % (ref 11.5–15.5)
WBC: 5.3 10*3/uL (ref 4.0–10.5)

## 2012-03-16 LAB — BASIC METABOLIC PANEL
BUN: 39 mg/dL — ABNORMAL HIGH (ref 6–23)
CO2: 28 mEq/L (ref 19–32)
Calcium: 9.4 mg/dL (ref 8.4–10.5)
Chloride: 101 mEq/L (ref 96–112)
Creatinine, Ser: 2.89 mg/dL — ABNORMAL HIGH (ref 0.50–1.35)
GFR calc Af Amer: 26 mL/min — ABNORMAL LOW (ref >90)
GFR calc non Af Amer: 22 mL/min — ABNORMAL LOW (ref >90)
Glucose, Bld: 110 mg/dL — ABNORMAL HIGH (ref 70–99)
Potassium: 3.1 mEq/L — ABNORMAL LOW (ref 3.5–5.1)
Sodium: 140 mEq/L (ref 135–145)

## 2012-03-16 LAB — PROTIME-INR
INR: 1.18 (ref 0.00–1.49)
Prothrombin Time: 14.8 seconds (ref 11.6–15.2)

## 2012-03-16 LAB — PHOSPHORUS: Phosphorus: 3.3 mg/dL (ref 2.3–4.6)

## 2012-03-16 LAB — TROPONIN I
Troponin I: <0.3 ng/mL (ref <0.30)
Troponin I: <0.3 ng/mL (ref <0.30)

## 2012-03-16 LAB — MAGNESIUM: Magnesium: 2.2 mg/dL (ref 1.5–2.5)

## 2012-03-16 LAB — HEPARIN LEVEL (UNFRACTIONATED): Heparin Unfractionated: 0.62 IU/mL (ref 0.30–0.70)

## 2012-03-16 MED ORDER — SODIUM CHLORIDE 0.9 % IV SOLN: INTRAVENOUS | Status: DC

## 2012-03-16 MED ORDER — CYCLOBENZAPRINE HCL 10 MG PO TABS
10.0000 mg | ORAL_TABLET | Freq: Once | ORAL | Status: AC
  Administered 2012-03-16: 10 mg via ORAL
  Filled 2012-03-16: qty 1

## 2012-03-16 MED ORDER — ISOSORBIDE MONONITRATE 15 MG HALF TABLET
15.0000 mg | ORAL_TABLET | Freq: Every day | ORAL | Status: DC
  Administered 2012-03-16 – 2012-03-18 (×3): 15 mg via ORAL
  Filled 2012-03-16 (×3): qty 1

## 2012-03-16 MED ORDER — HEPARIN (PORCINE) IN NACL 100-0.45 UNIT/ML-% IJ SOLN
1500.0000 [IU]/h | INTRAMUSCULAR | Status: DC
  Administered 2012-03-16 – 2012-03-18 (×3): 1500 [IU]/h via INTRAVENOUS
  Filled 2012-03-16 (×5): qty 250

## 2012-03-16 MED ORDER — MORPHINE SULFATE 2 MG/ML IJ SOLN: 2.0000 mg | Freq: Once | INTRAMUSCULAR | Status: DC

## 2012-03-16 NOTE — Progress Notes (Signed)
ANTICOAGULATION CONSULT NOTE - Follow up [facility] for IV heparin Indication: h/o DVT, r/o PE  No Known Allergies  Patient Measurements: Height: 6\' 4"  (193 cm) Weight: 290 lb 11.2 oz (131.861 kg) IBW/kg (Calculated) : 86.8  Heparin Dosing Weight: 115.5 kg   Vital Signs: Temp: 97.9 F (36.6 C) (10/16 2020) Temp src: Oral (10/16 2020) BP: 152/88 mmHg (10/16 2020) Pulse Rate: 78  (10/16 2020)  Labs:  Basename 03/16/12 1935 03/16/12 0820 03/16/12 0204 03/15/12 2050 03/15/12 1735  HGB -- -- 9.9* -- 9.8*  HCT -- -- 29.4* -- 28.8*  PLT -- -- 216 -- 214  APTT -- -- -- -- --  LABPROT -- -- 14.8 -- 14.2  INR -- -- 1.18 -- 1.11  HEPARINUNFRC 0.62 -- -- -- --  CREATININE -- -- 2.89* -- 2.90*  CKTOTAL -- -- -- -- --  CKMB -- -- -- -- --  TROPONINI -- <0.30 <0.30 <0.30 --   Estimated Creatinine Clearance: 40.8 ml/min (by C-G formula based on Cr of 2.89).  Medical History: Past Medical History  Diagnosis Date  . Cancer   . Diabetes mellitus   . DVT (deep venous thrombosis)     Right leg  . Diabetic retinopathy(362.0)   . Hypertension   . Hyperlipidemia   . Non Hodgkin's lymphoma     Tx 2009  . History of cardiac catheterization     pt denies this on 03/15/2012  . Nodular lymphoma of intra-abdominal lymph nodes   . Shortness of breath   . Sleep apnea     "suppose to have a sleep studfy, but they never told me when.  . Peripheral vascular disease   . Blood transfusion   . 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  Marland Kitchen Headache   . Family history of anesthesia complication     " my son cant have it because he cant wake up "  . Noncompliance 03/16/2012    Assessment:  17 yom presented 10/15 with CP, SOB and admitted to medication noncompliance x several days. Pt was on Coumadin PTA for h/o RLE DVT, non-Hodgkin's lymphoma. INR on admit = 1.1. MD suspicious for PE but given renal insufficiency would not proceed with CTA at this time (and this  does not change short term therapy).  Lovenox 1mg /kg started given renal insufficiency and coumadin resumed per pharmacy.    Scr 2.90, CG CrCl 41 ml/min.  Dr. Reynaldo Minium saw patient this AM, plan transfer to [facility] for hydration and possible cardiac cath given risk factors for ACS.  Per telephone order from Dr. Varney Daily, stop Lovenox and Coumadin, start IV heparin.  Patient received Lovenox 140 mg sq x 1 at 1925 on 10/15 (q12h dosing interval)  Heparin level this evening was at goal, it is unlikely that very much lovenox is still on board as last dose was over 24 hours ago. No bleeding issues noted, will continue current rate and follow plan in am.  Goal of Therapy:  Heparin level 0.3-0.7 units/ml Monitor platelets by anticoagulation protocol: Yes   Plan:   Continue IV heparin at 1500 units/hr  Daily heparin level and CBC  Georgina Peer 03/16/2012,8:45 PM

## 2012-03-16 NOTE — Progress Notes (Signed)
ANTICOAGULATION CONSULT NOTE - Initial Consult  Pharmacy Consult for IV heparin Indication: h/o DVT, r/o PE  No Known Allergies  Patient Measurements: Height: 6\' 4"  (193 cm) Weight: 290 lb 11.2 oz (131.861 kg) IBW/kg (Calculated) : 86.8  Heparin Dosing Weight: 115.5 kg   Vital Signs: Temp: 97.8 F (36.6 C) (10/16 0420) Temp src: Oral (10/16 0420) BP: 108/60 mmHg (10/16 0420) Pulse Rate: 69  (10/16 0420)  Labs:  Basename 03/16/12 0820 03/16/12 0204 03/15/12 2050 03/15/12 1735  HGB -- 9.9* -- 9.8*  HCT -- 29.4* -- 28.8*  PLT -- 216 -- 214  APTT -- -- -- --  LABPROT -- 14.8 -- 14.2  INR -- 1.18 -- 1.11  HEPARINUNFRC -- -- -- --  CREATININE -- 2.89* -- 2.90*  CKTOTAL -- -- -- --  CKMB -- -- -- --  TROPONINI <0.30 <0.30 <0.30 --   Estimated Creatinine Clearance: 40.8 ml/min (by C-G formula based on Cr of 2.89).  Medical History: Past Medical History  Diagnosis Date  . Cancer   . Diabetes mellitus   . DVT (deep venous thrombosis)     Right leg  . Diabetic retinopathy(362.0)   . Hypertension   . Hyperlipidemia   . Non Hodgkin's lymphoma     Tx 2009  . History of cardiac catheterization     pt denies this on 03/15/2012  . Nodular lymphoma of intra-abdominal lymph nodes   . Shortness of breath   . Sleep apnea     "suppose to have a sleep studfy, but they never told me when.  . Peripheral vascular disease   . Blood transfusion   . 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  Marland Kitchen Headache     Assessment:  43 yom presented 10/15 with CP, SOB and admitted to medication noncompliance x several days. Pt was on Coumadin PTA for h/o RLE DVT, non-Hodgkin's lymphoma. INR on admit = 1.1. MD suspicious for PE but given renal insufficiency would not proceed with CTA at this time (and this does not change short term therapy).  Lovenox 1mg /kg started given renal insufficiency and coumadin resumed per pharmacy.    Scr 2.90, CG CrCl 41 ml/min.  Dr. Reynaldo Minium saw patient  this AM, plan transfer to [facility] for hydration and possible cardiac cath given risk factors for ACS.  Per telephone order from Dr. Varney Daily, stop Lovenox and Coumadin, start IV heparin.  Patient received Lovenox 140 mg sq x 1 at 1925 on 10/15 (q12h dosing interval)  INR 1.18, hgb low/stable, no bleeding/complications from anticoagulation documented.  Plts wnl  Goal of Therapy:  Heparin level 0.3-0.7 units/ml Monitor platelets by anticoagulation protocol: Yes   Plan:   D/C Lovenox, D/C Coumadin, D/C daily PT/INR  No IV heparin bolus  Start IV heparin at 1500 units/hr  Heparin level 8 hours post heparin initiation  Daily heparin level and CBC  Will communicate with pharmacist @ [facility] regarding follow up  Vanessa [place] Thi 03/16/2012,9:49 AM

## 2012-03-16 NOTE — Progress Notes (Signed)
Pt was complaining of chest pain and a tightness feeling. RN did a 12-lead ECG and the rhythm was sinus rhythm with occasional PVCs. MD, Ardeth Perfect was notified and he wanted tylenol given. If the tylenol does not help, MD ordered a 1 time dose of 2mg  morphine. RN will monitor the patient's pain and heart rhythm closely. Patient is now resting comfortably in bed.

## 2012-03-16 NOTE — Progress Notes (Signed)
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 He is always been certainly well-meaning and as a result of this now has diabetic renal disease as well. He presents with this 2 hour episode of chest pain is noted in the mini note which is somewhat typical can't find by an EKG with chronic T changes and bifascicular block. His multiple risk factors and as a result his picture is probably compatible with an acute coronary syndrome that is subsequently stabilized. At this time he relates initially that he is pain-free but upon further questioning he claims he is having intermittent twinges of pain so with very difficult history at best.  Objective: Vital signs in last 24 hours: Temp:  [97.3 F (36.3 C)-97.9 F (36.6 C)] 97.8 F (36.6 C) (10/16 0420) Pulse Rate:  [69-82] 69  (10/16 0420) Resp:  [11-16] 16  (10/16 0420) BP: (108-163)/(54-96) 108/60 mmHg (10/16 0420) SpO2:  [96 %-100 %] 98 % (10/16 0420) Weight:  [131.861 kg (290 lb 11.2 oz)-135.172 kg (298 lb)] 131.861 kg (290 lb 11.2 oz) (10/16 0500) Weight change:   CBG (last 3)   Basename 03/16/12 0417 03/15/12 2348 03/15/12 2106  GLUCAP 126* 304* 364*    Intake/Output from previous day: 10/15 0701 - 10/16 0700 In: 920 [P.O.:100; I.V.:820] Out: 1275 [Urine:1275]  Physical Exam: Patient is awake alert no distress. Good facial symmetry. Anicteric. No JVD or bruits. Lungs are clear to auscultation percussion. Cardiovascular same regular rate and rhythm 1/6 systolic ejection murmur. Abdomen is soft nontender good bowel sounds. Extremities do reveal some venous stasis changes. 1+ edema which is chronic. Intact distal pulses. Neurologically higher cortical functioning is intact and he is nonlateralizing   Lab Results:  Basename  03/16/12 0204 03/15/12 1735  NA 140 129*  K 3.1* 4.2  CL 101 91*  CO2 28 26  GLUCOSE 110* 568*  BUN 39* 42*  CREATININE 2.89* 2.90*  CALCIUM 9.4 9.4  MG 2.2 --  PHOS 3.3 --   No results found for this basename: AST:2,ALT:2,ALKPHOS:2,BILITOT:2,PROT:2,ALBUMIN:2 in the last 72 hours  Basename 03/16/12 0204 03/15/12 1735  WBC 5.3 5.6  NEUTROABS -- 3.6  HGB 9.9* 9.8*  HCT 29.4* 28.8*  MCV 79.9 80.2  PLT 216 214   Lab Results  Component Value Date   INR 1.18 03/16/2012   INR 1.11 03/15/2012   INR 1.93* 05/25/2011    Basename 03/16/12 0204 03/15/12 2050  CKTOTAL -- --  CKMB -- --  CKMBINDEX -- --  TROPONINI <0.30 <0.30   No results found for this basename: TSH,T4TOTAL,FREET3,T3FREE,THYROIDAB in the last 72 hours No results found for this basename: VITAMINB12:2,FOLATE:2,FERRITIN:2,TIBC:2,IRON:2,RETICCTPCT:2 in the last 72 hours  Studies/Results: Dg Chest 2 View  03/15/2012  *RADIOLOGY REPORT*  Clinical Data: 61 year old male with chest pain and shortness of breath.  CHEST - 2 VIEW  Comparison: 08/27/2011 and prior chest radiographs  Findings: The cardiomediastinal silhouette is unremarkable. The lungs are clear. There is no evidence of focal airspace disease, pulmonary edema, suspicious pulmonary nodule/mass, pleural effusion, or pneumothorax. No acute bony abnormalities are identified.  IMPRESSION: No evidence of acute cardiopulmonary disease.   Original Report Authenticated By: Lura Em, [name]      Assessment/Plan: #1 chest pain compatible with probable acute coronary syndrome or unstable angina certainly given his risk factors cardiac until proven otherwise. He  does have underlying thromboembolic disease but this is a bit different than at presentation. EKG difficult to interpret given his chronic changes to includes bivesicular block and chronic repolarization changes. Initial enzymes are negative. I discussed the case with Dr. Donnella Bi we'll transfer him to their  service [facility] for pre-hydration and cardiac catheterization given his unstable disease and high risk status. He will require some treatment to prevent further contrast-induced nephropathy. He'll be transferred to the services at [facility] as discussed. We will institute heparin at this time as well.  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   LOS: 1 day   Rosalio Catterton A 03/16/2012, 8:14 AM

## 2012-03-16 NOTE — Progress Notes (Signed)
Normal saline fluids d/ced per orders at 0347. No other IV fluids were ordered at this time. In the MD's notes, it says "hydrate x 6 hours via IV then PO only" due to pt's heart failure. RN will continue to monitor.

## 2012-03-16 NOTE — Progress Notes (Signed)
Called report to Almyra Free, RN at [facility]; stated understanding and denied questions./concerns; pt is aware of transfer and understands; pt stable at time of transfer

## 2012-03-16 NOTE — Care Management Note (Unsigned)
    Page 1 of 1   03/16/2012     10:43:35 AM   CARE MANAGEMENT NOTE 03/16/2012  Patient:  [name], [name]   Account Number:  [id number]  Date Initiated:  03/16/2012  Documentation initiated by:  Dessa Phi  Subjective/Objective Assessment:   ADMITTED W/CHEST PAIN.HX:DM,LYMPHOMA,CHF,HTN,OBESITY     Action/Plan:   FROM HOME   Anticipated DC Date:  03/18/2012   Anticipated DC Plan:  [place]  CM consult  Medication Assistance      Choice offered to / List presented to:             Status of service:  In process, will continue to follow Medicare Important Message given?   (If response is "NO", the following Medicare IM given date fields will be blank) Date Medicare IM given:   Date Additional Medicare IM given:    Discharge Disposition:  ACUTE TO ACUTE TRANS  Per UR Regulation:  Reviewed for med. necessity/level of care/duration of stay  If discussed at [place] of Stay Meetings, dates discussed:    Comments:  03/16/12 Kaesyn Johnston RN,BSN NCM 29 Joshua TO BRING CARD IN FOR VERIFICATION.PER OUR BENEFIT CHECK HE DOES NOT HAVE SCRIPT COVERAGE.HE STATES HE CAN AFFORD HIS MEDS. TRANSFER TO [facility].PATIENT HAS SCRIPT COVERAGE,THEREFORE DOES NOT QUALIFY FOR INDIGENT FUNDS FOR MED ASSIST.GOES TO [facility],EXPLAINED THAT IF GENERIC SCRIPTS WRITTEN & ON $4 [facility] LIST, THAT IS THE MOST REASONABLE COST.HE SAYS HE CAN AFFORD IT IF ON [facility] LIST.ALSO ENCOURAGED PATIENT TO ASK PCP OFFICE FOR SAMPLES, & TOLD HIM TO ASK PHARMACIST @ [facility] FOR DISCOUNTS. WILL CHECK BENEFITS FOR PRESCRIPTION COVERAGE,OFFER RESOURCES.

## 2012-03-16 NOTE — Consult Note (Signed)
Reason for Consult: chest pain   Referring Physician: Dr. Reynaldo Minium PCP   [name] is an 61 y.o. male.    Chief Complaint: chest pain with admit to [facility].   HPI: 61 y.o. male with a past medical history significant for diabetes and hypertension. He had a heart catheterization in 2010 which revealed normal coronary arteries. Echocardiogram in August of 2012 showed an ejection fraction of 55-60% with moderate LVH. He has not seen a cardiologist as an outpatient. Unfortunately according to Dr. Jacquiline Doe notes he said questionable compliance in the past usually secondary to financial issues. 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  Presented to [facility] secondary to chest pain, sharp and substernal to L breast.  Some radiation down his left arm.  No N/V or diaphoresis.  He missed his meds this past weekend.   He is on coumadin for DVT, sub therapeutic on admit.     Currently he has mild chest pressure. 1/10, on admit 6-7/10.  Past Medical History  Diagnosis Date  . Cancer   . Diabetes mellitus   . DVT (deep venous thrombosis)     Right leg  . Diabetic retinopathy(362.0)   . Hypertension   . Hyperlipidemia   . Non Hodgkin's lymphoma     Tx 2009  . History of cardiac catheterization     pt denies this on 03/15/2012  . Nodular lymphoma of intra-abdominal lymph nodes   . Shortness of breath   . Sleep apnea     "suppose to have a sleep studfy, but they never told me when.  . Peripheral vascular disease   . Blood transfusion   . 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  Marland Kitchen Headache   . Family history of anesthesia complication     " my son cant have it because he cant wake up "  . Noncompliance 03/16/2012    Past Surgical History  Procedure Date  . Porta catheter   . Porta catheter insertion   . Porta catheter removed   . Coloscopy   . Pars plana vitrectomy 08/27/2011    Procedure: PARS PLANA  VITRECTOMY WITH 25 GAUGE;  Surgeon: Hayden Pedro, MD;  Location: [facility];  Service: Ophthalmology;  Laterality: Left;  Repair of complex traction retinal detachment left eye  . Cardiac catheterization     pt denies this on 03/15/2012    Family History  Problem Relation Age of Onset  . Anesthesia problems Son   He reports his father had diabetes and heart disease but is not sure of the ages and severity/type of disease. He did not live with his father.  Social History:  reports that he has never smoked. He has never used smokeless tobacco. He reports that he does not drink alcohol or use illicit drugs.  Married with one son.  disability for  Lymphoma.  Allergies: No Known Allergies  Medications Prior to Admission  Medication Sig Dispense Refill  . acetaminophen (TYLENOL) 500 MG tablet Take 1,000 mg by mouth every 6 (six) hours as needed. For headache       . amLODipine (NORVASC) 5 MG tablet Take 1 tablet (5 mg total) by mouth daily.  30 tablet  6  . calcitRIOL (ROCALTROL) 0.25 MCG capsule Take 0.25 mcg by mouth daily.        . carvedilol (COREG) 25 MG tablet Take 25 mg by mouth  2 (two) times daily with a meal.      . furosemide (LASIX) 80 MG tablet Take 1 tablet (80 mg total) by mouth 2 (two) times daily.  60 tablet  5  . gatifloxacin (ZYMAXID) 0.5 % SOLN Place 1 drop into the left eye 3 (three) times daily.      . insulin glargine (LANTUS) 100 UNIT/ML injection Inject 50 Units into the skin at bedtime.  10 mL  12  . insulin lispro (HUMALOG) 100 UNIT/ML injection Inject 60 Units into the skin 2 (two) times daily.       . polysaccharide iron (NIFEREX) 150 MG CAPS capsule Take 1 capsule (150 mg total) by mouth daily.  30 each  2  . warfarin (COUMADIN) 5 MG tablet Take 10-12.5 mg by mouth daily. 2 tab daily except for 2.5 tabs daily on Mondays and Fridays.        Results for orders placed during the hospital encounter of 03/15/12 (from the past 48 hour(s))  CBC WITH DIFFERENTIAL     Status:  Abnormal   Collection Time   03/15/12  5:35 PM      Component Value Range Comment   WBC 5.6  4.0 - 10.5 K/uL    RBC 3.59 (*) 4.22 - 5.81 MIL/uL    Hemoglobin 9.8 (*) 13.0 - 17.0 g/dL    HCT 28.8 (*) 39.0 - 52.0 %    MCV 80.2  78.0 - 100.0 fL    MCH 27.3  26.0 - 34.0 pg    MCHC 34.0  30.0 - 36.0 g/dL    RDW 15.0  11.5 - 15.5 %    Platelets 214  150 - 400 K/uL    Neutrophils Relative 65  43 - 77 %    Neutro Abs 3.6  1.7 - 7.7 K/uL    Lymphocytes Relative 28  12 - 46 %    Lymphs Abs 1.5  0.7 - 4.0 K/uL    Monocytes Relative 6  3 - 12 %    Monocytes Absolute 0.3  0.1 - 1.0 K/uL    Eosinophils Relative 1  0 - 5 %    Eosinophils Absolute 0.1  0.0 - 0.7 K/uL    Basophils Relative 0  0 - 1 %    Basophils Absolute 0.0  0.0 - 0.1 K/uL   BASIC METABOLIC PANEL     Status: Abnormal   Collection Time   03/15/12  5:35 PM      Component Value Range Comment   Sodium 129 (*) 135 - 145 mEq/L    Potassium 4.2  3.5 - 5.1 mEq/L    Chloride 91 (*) 96 - 112 mEq/L    CO2 26  19 - 32 mEq/L    Glucose, Bld 568 (*) 70 - 99 mg/dL    BUN 42 (*) 6 - 23 mg/dL    Creatinine, Ser 2.90 (*) 0.50 - 1.35 mg/dL    Calcium 9.4  8.4 - 10.5 mg/dL    GFR calc non Af Amer 22 (*) >90 mL/min    GFR calc Af Amer 26 (*) >90 mL/min   PRO B NATRIURETIC PEPTIDE     Status: Abnormal   Collection Time   03/15/12  5:35 PM      Component Value Range Comment   Pro B Natriuretic peptide (BNP) 2217.0 (*) 0 - 125 pg/mL   PROTIME-INR     Status: Normal   Collection Time   03/15/12  5:35 PM  Component Value Range Comment   Prothrombin Time 14.2  11.6 - 15.2 seconds    INR 1.11  0.00 - 1.49   POCT I-STAT TROPONIN I     Status: Normal   Collection Time   03/15/12  5:39 PM      Component Value Range Comment   Troponin i, poc 0.02  0.00 - 0.08 ng/mL    Comment 3            GLUCOSE, CAPILLARY     Status: Abnormal   Collection Time   03/15/12  7:03 PM      Component Value Range Comment   Glucose-Capillary 519 (*) 70 - 99  mg/dL    Comment 1 Documented in Chart      Comment 2 Notify RN     GLUCOSE, CAPILLARY     Status: Abnormal   Collection Time   03/15/12  8:03 PM      Component Value Range Comment   Glucose-Capillary 488 (*) 70 - 99 mg/dL   TROPONIN I     Status: Normal   Collection Time   03/15/12  8:50 PM      Component Value Range Comment   Troponin I <0.30  <0.30 ng/mL   GLUCOSE, CAPILLARY     Status: Abnormal   Collection Time   03/15/12  9:06 PM      Component Value Range Comment   Glucose-Capillary 364 (*) 70 - 99 mg/dL    Comment 1 Documented in Chart      Comment 2 Notify RN     GLUCOSE, CAPILLARY     Status: Abnormal   Collection Time   03/15/12 11:48 PM      Component Value Range Comment   Glucose-Capillary 304 (*) 70 - 99 mg/dL    Comment 1 Documented in Chart      Comment 2 Notify RN     PROTIME-INR     Status: Normal   Collection Time   03/16/12  2:04 AM      Component Value Range Comment   Prothrombin Time 14.8  11.6 - 15.2 seconds    INR 1.18  0.00 - 1.49   CBC     Status: Abnormal   Collection Time   03/16/12  2:04 AM      Component Value Range Comment   WBC 5.3  4.0 - 10.5 K/uL    RBC 3.68 (*) 4.22 - 5.81 MIL/uL    Hemoglobin 9.9 (*) 13.0 - 17.0 g/dL    HCT 29.4 (*) 39.0 - 52.0 %    MCV 79.9  78.0 - 100.0 fL    MCH 26.9  26.0 - 34.0 pg    MCHC 33.7  30.0 - 36.0 g/dL    RDW 15.0  11.5 - 15.5 %    Platelets 216  150 - 400 K/uL   MAGNESIUM     Status: Normal   Collection Time   03/16/12  2:04 AM      Component Value Range Comment   Magnesium 2.2  1.5 - 2.5 mg/dL   PHOSPHORUS     Status: Normal   Collection Time   03/16/12  2:04 AM      Component Value Range Comment   Phosphorus 3.3  2.3 - 4.6 mg/dL   BASIC METABOLIC PANEL     Status: Abnormal   Collection Time   03/16/12  2:04 AM      Component Value Range Comment   Sodium 140  135 -  145 mEq/L    Potassium 3.1 (*) 3.5 - 5.1 mEq/L    Chloride 101  96 - 112 mEq/L    CO2 28  19 - 32 mEq/L    Glucose, Bld  110 (*) 70 - 99 mg/dL    BUN 39 (*) 6 - 23 mg/dL    Creatinine, Ser 2.89 (*) 0.50 - 1.35 mg/dL    Calcium 9.4  8.4 - 10.5 mg/dL    GFR calc non Af Amer 22 (*) >90 mL/min    GFR calc Af Amer 26 (*) >90 mL/min   TROPONIN I     Status: Normal   Collection Time   03/16/12  2:04 AM      Component Value Range Comment   Troponin I <0.30  <0.30 ng/mL   GLUCOSE, CAPILLARY     Status: Abnormal   Collection Time   03/16/12  4:17 AM      Component Value Range Comment   Glucose-Capillary 126 (*) 70 - 99 mg/dL    Comment 1 Documented in Chart      Comment 2 Notify RN     GLUCOSE, CAPILLARY     Status: Abnormal   Collection Time   03/16/12  7:52 AM      Component Value Range Comment   Glucose-Capillary 156 (*) 70 - 99 mg/dL    Comment 1 Notify RN     TROPONIN I     Status: Normal   Collection Time   03/16/12  8:20 AM      Component Value Range Comment   Troponin I <0.30  <0.30 ng/mL   GLUCOSE, CAPILLARY     Status: Abnormal   Collection Time   03/16/12 11:48 AM      Component Value Range Comment   Glucose-Capillary 296 (*) 70 - 99 mg/dL    Comment 1 Notify RN      Dg Chest 2 View  03/15/2012  *RADIOLOGY REPORT*  Clinical Data: 61 year old male with chest pain and shortness of breath.  CHEST - 2 VIEW  Comparison: 08/27/2011 and prior chest radiographs  Findings: The cardiomediastinal silhouette is unremarkable. The lungs are clear. There is no evidence of focal airspace disease, pulmonary edema, suspicious pulmonary nodule/mass, pleural effusion, or pneumothorax. No acute bony abnormalities are identified.  IMPRESSION: No evidence of acute cardiopulmonary disease.   Original Report Authenticated By: Lura Em, [name]    ROS: General:no colds or fevers, no weight changes, no change in activity level, was fine until chest pain yesterday Skin:no rashes or ulcers HEENT:no blurred vision CV:see HPI PUL:no SOB, sleeps flat GI:No diarrhea, constipation, no melena GU:no hematuria MS:no joint  pain Neuro:no syncope, no lightheadedness  Endo:diabetes stable, no thyroid disease ? Sleep apnea    Blood pressure 135/71, pulse 80, temperature 98 F (36.7 C), temperature source Oral, resp. rate 18, height 6\' 4"  (1.93 m), weight 131.861 kg (290 lb 11.2 oz), SpO2 97.00%. PE: General:alert and oriented, pleasant affect, NAD, though chest pressure continues Skin:warm and dry, brisk capillary refill HEENT:normocephalic, sclera clear Neck:supple, no JVD, no bruits Heart:S1S2 RRR, no obvious murmur, gallup, rub or click Lungs:clear without rales, rhonchi, or wheezes Abd:+ Bs soft, non tender Ext:no edema Neuro:alert and oriented X 3 MAE follows commands  Point tenderness along the lower ribs the left lateral side along the mid axillary line  Assessment/Plan Principal Problem:  *Chest pain Active Problems:  LYMPHOMA  DIABETES MELLITUS-TYPE II  Warfarin-induced coagulopathy  History of DVT of lower extremity  History  of cardiac catheterization  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  Plan for lexiscan nuclear stress test. INGOLD,LAURA R 03/16/2012, 4:13 PM  I've seen and examined the patient along with Cecilie Kicks, NP. I agree with her findings, examination and assessment /plan.  Mr. Spradling has progressed factors of diabetes hypertension and reported PVD however he heart catheterization 2010 minimal coronary artery disease. The symptoms he is having his left lower chest pain that feels sort of sharp. It has been off-and-on for the last day or so. Is not made worse by ambulation not but it is made worse with deep inspiration.  My suspicion is that his symptoms are muscular skeletal related however with his risk factors atypical angina cannot be excluded.  He is unlikely to develop multivessel disease in the last 3 years therefore think  the most efficient way to evaluate him for possible ischemic coronary disease would be to with noninvasive Myoview stress test in the morning.   My understanding is he is not likely to be of a walker treadmill, therefore will plan with a LexiScan Myoview the morning.  The benefit of knowing his coronary anatomy as it is unlikely to have anatomy that would give balance ischemia therefore negative stress test can be safely soon to be true negative.  We'll continue to rule out with for MI with cardiac enzymes.  Leonie Man, [name], M.S. [facility] [address] [place], [place]  [address]  [phone] Pager # [phone] 03/16/2012 7:06 PM

## 2012-03-17 ENCOUNTER — Other Ambulatory Visit: Payer: Self-pay

## 2012-03-17 ENCOUNTER — Inpatient Hospital Stay (HOSPITAL_COMMUNITY): Payer: Medicare Other

## 2012-03-17 DIAGNOSIS — R0789 Other chest pain: Secondary | ICD-10-CM | POA: Diagnosis not present

## 2012-03-17 DIAGNOSIS — R079 Chest pain, unspecified: Secondary | ICD-10-CM | POA: Diagnosis not present

## 2012-03-17 DIAGNOSIS — N184 Chronic kidney disease, stage 4 (severe): Secondary | ICD-10-CM | POA: Diagnosis not present

## 2012-03-17 DIAGNOSIS — E119 Type 2 diabetes mellitus without complications: Secondary | ICD-10-CM | POA: Diagnosis not present

## 2012-03-17 LAB — BASIC METABOLIC PANEL
BUN: 43 mg/dL — ABNORMAL HIGH (ref 6–23)
CO2: 26 mEq/L (ref 19–32)
Calcium: 9.2 mg/dL (ref 8.4–10.5)
Chloride: 103 mEq/L (ref 96–112)
Creatinine, Ser: 3.24 mg/dL — ABNORMAL HIGH (ref 0.50–1.35)
GFR calc Af Amer: 23 mL/min — ABNORMAL LOW (ref >90)
GFR calc non Af Amer: 19 mL/min — ABNORMAL LOW (ref >90)
Glucose, Bld: 101 mg/dL — ABNORMAL HIGH (ref 70–99)
Potassium: 3.2 mEq/L — ABNORMAL LOW (ref 3.5–5.1)
Sodium: 141 mEq/L (ref 135–145)

## 2012-03-17 LAB — CBC
HCT: 29.6 % — ABNORMAL LOW (ref 39.0–52.0)
Hemoglobin: 9.9 g/dL — ABNORMAL LOW (ref 13.0–17.0)
MCH: 27.5 pg (ref 26.0–34.0)
MCHC: 33.4 g/dL (ref 30.0–36.0)
MCV: 82.2 fL (ref 78.0–100.0)
Platelets: 207 10*3/uL (ref 150–400)
RBC: 3.6 MIL/uL — ABNORMAL LOW (ref 4.22–5.81)
RDW: 15.6 % — ABNORMAL HIGH (ref 11.5–15.5)
WBC: 5.9 10*3/uL (ref 4.0–10.5)

## 2012-03-17 LAB — GLUCOSE, CAPILLARY
Glucose-Capillary: 101 mg/dL — ABNORMAL HIGH (ref 70–99)
Glucose-Capillary: 109 mg/dL — ABNORMAL HIGH (ref 70–99)
Glucose-Capillary: 216 mg/dL — ABNORMAL HIGH (ref 70–99)
Glucose-Capillary: 239 mg/dL — ABNORMAL HIGH (ref 70–99)
Glucose-Capillary: 269 mg/dL — ABNORMAL HIGH (ref 70–99)
Glucose-Capillary: 90 mg/dL (ref 70–99)

## 2012-03-17 LAB — HEPARIN LEVEL (UNFRACTIONATED): Heparin Unfractionated: 0.51 [IU]/mL (ref 0.30–0.70)

## 2012-03-17 LAB — PHOSPHORUS: Phosphorus: 4.2 mg/dL (ref 2.3–4.6)

## 2012-03-17 MED ORDER — WARFARIN - PHARMACIST DOSING INPATIENT: Freq: Every day | Status: DC

## 2012-03-17 MED ORDER — INSULIN ASPART PROT & ASPART (70-30 MIX) 100 UNIT/ML ~~LOC~~ SUSP
10.0000 [IU] | Freq: Two times a day (BID) | SUBCUTANEOUS | Status: DC
  Filled 2012-03-17: qty 3

## 2012-03-17 MED ORDER — WARFARIN SODIUM 2.5 MG PO TABS
12.5000 mg | ORAL_TABLET | Freq: Once | ORAL | Status: AC
  Administered 2012-03-17: 12.5 mg via ORAL
  Filled 2012-03-17: qty 1

## 2012-03-17 MED ORDER — POTASSIUM CHLORIDE CRYS ER 20 MEQ PO TBCR
40.0000 meq | EXTENDED_RELEASE_TABLET | Freq: Once | ORAL | Status: AC
  Administered 2012-03-17: 40 meq via ORAL

## 2012-03-17 MED ORDER — REGADENOSON 0.4 MG/5ML IV SOLN
INTRAVENOUS | Status: AC
  Filled 2012-03-17: qty 5

## 2012-03-17 MED ORDER — INSULIN ASPART PROT & ASPART (70-30 MIX) 100 UNIT/ML ~~LOC~~ SUSP
10.0000 [IU] | Freq: Two times a day (BID) | SUBCUTANEOUS | Status: DC
  Administered 2012-03-17 – 2012-03-18 (×2): 10 [IU] via SUBCUTANEOUS
  Filled 2012-03-17: qty 3

## 2012-03-17 MED ORDER — POTASSIUM CHLORIDE CRYS ER 20 MEQ PO TBCR
40.0000 meq | EXTENDED_RELEASE_TABLET | Freq: Once | ORAL | Status: AC
  Administered 2012-03-17: 40 meq via ORAL
  Filled 2012-03-17 (×2): qty 2

## 2012-03-17 MED ORDER — REGADENOSON 0.4 MG/5ML IV SOLN
0.4000 mg | Freq: Once | INTRAVENOUS | Status: AC
  Administered 2012-03-17: 0.4 mg via INTRAVENOUS
  Filled 2012-03-17: qty 5

## 2012-03-17 NOTE — Progress Notes (Addendum)
ANTICOAGULATION CONSULT NOTE - Follow Up Consult  Pharmacy Consult for Heparin  (Coumadin on hold) Indication: hx DVT;  r/o PE, r/o ischemia  No Known Allergies  Patient Measurements: Height: 6\' 4"  (193 cm) Weight: 290 lb 11.2 oz (131.861 kg) IBW/kg (Calculated) : 86.8  Heparin Dosing Weight: 115.5 kg  Vital Signs: Temp: 98 F (36.7 C) (10/17 0417) Temp src: Oral (10/17 0417) BP: 131/48 mmHg (10/17 0923) Pulse Rate: 79  (10/17 0417)  Labs:  Basename 03/17/12 0430 03/16/12 1935 03/16/12 0820 03/16/12 0204 03/15/12 2050 03/15/12 1735  HGB 9.9* -- -- 9.9* -- --  HCT 29.6* -- -- 29.4* -- 28.8*  PLT 207 -- -- 216 -- 214  APTT -- -- -- -- -- --  LABPROT -- -- -- 14.8 -- 14.2  INR -- -- -- 1.18 -- 1.11  HEPARINUNFRC 0.51 0.62 -- -- -- --  CREATININE 3.24* -- -- 2.89* -- 2.90*  CKTOTAL -- -- -- -- -- --  CKMB -- -- -- -- -- --  TROPONINI -- -- <0.30 <0.30 <0.30 --    Estimated Creatinine Clearance: 36.4 ml/min (by C-G formula based on Cr of 3.24).  Assessment:   Heparin level remains therapeutic on 1500 units/hr.   Off Coumadin. INR close to baseline on 10/16. Home Coumadin regimen: 10 mg daily except 12.5 mg on Mondays and Fridays.  Two-day stress test underway.  Intermittent chest pain noted.  Goal of Therapy:  Heparin level 0.3-0.7 units/ml Monitor platelets by anticoagulation protocol: Yes   Plan:   Continue Heparin drip at 1500 units/hr.  Continue daily heparin level and CBC.  Will follow up for resuming Coumadin and possible transition from Heparin to Lovenox.  Arty Baumgartner, [place] Pager: [phone] 03/17/2012,12:46 PM  4pm addendum:   Coumadin to resume tonight.   Plan Coumadin 12.5 mg tonight.   Daily PT/INR.  Consuello Masse, RPh 03/17/12 4pm

## 2012-03-17 NOTE — Progress Notes (Signed)
Internal Medicine Consult - Daily Progress Note  Subjective: Patient was transferred to the cardiology service yesterday for work up of his chest pain. It appears he will likely have a LexiScan Myoview test today to evaluate for ischemia. He is currently on heparin ggt. He is still having intermittent chest pain. He is NPO this AM   Objective: Vital signs in last 24 hours: Temp:  [97.9 F (36.6 C)-98 F (36.7 C)] 98 F (36.7 C) (10/17 0417) Pulse Rate:  [78-80] 79  (10/17 0417) Resp:  [18] 18  (10/17 0417) BP: (125-152)/(71-88) 125/71 mmHg (10/17 0417) SpO2:  [97 %-100 %] 99 % (10/17 0417) Weight change:   CBG (last 3)   Basename 03/17/12 0408 03/17/12 0024 03/16/12 2018  GLUCAP 109* 101* 358*    Intake/Output from previous day: 10/16 0701 - 10/17 0700 In: 1200 [P.O.:1200] Out: 600 [Urine:600]  Physical Exam: Patient is awake alert no distress.  No scleral icterus.  No JVD or bruits. Trachea midline  Lungs are clear to auscultation w/o rales or wheezes   RRR, 1/6 systolic ejection murmur.  Abdomen is soft nontender good bowel sounds.  Extremities have chronic 1+ edema  Intact distal pulses.   Lab Results:  [facility] 03/17/12 0430 03/16/12 0204  NA 141 140  K 3.2* 3.1*  CL 103 101  CO2 26 28  GLUCOSE 101* 110*  BUN 43* 39*  CREATININE 3.24* 2.89*  CALCIUM 9.2 9.4  MG -- 2.2  PHOS 4.2 3.3   No results found for this basename: AST:2,ALT:2,ALKPHOS:2,BILITOT:2,PROT:2,ALBUMIN:2 in the last 72 hours  Basename 03/17/12 0430 03/16/12 0204 03/15/12 1735  WBC 5.9 5.3 --  NEUTROABS -- -- 3.6  HGB 9.9* 9.9* --  HCT 29.6* 29.4* --  MCV 82.2 79.9 --  PLT 207 216 --   Lab Results  Component Value Date   INR 1.18 03/16/2012   INR 1.11 03/15/2012   INR 1.93* 05/25/2011    Basename 03/16/12 0820 03/16/12 0204 03/15/12 2050  CKTOTAL -- -- --  CKMB -- -- --  CKMBINDEX -- -- --  TROPONINI <0.30 <0.30 <0.30    Studies/Results: Dg Chest 2 View  03/15/2012   *RADIOLOGY REPORT*  Clinical Data: 61 year old male with chest pain and shortness of breath.  CHEST - 2 VIEW  Comparison: 08/27/2011 and prior chest radiographs  Findings: The cardiomediastinal silhouette is unremarkable. The lungs are clear. There is no evidence of focal airspace disease, pulmonary edema, suspicious pulmonary nodule/mass, pleural effusion, or pneumothorax. No acute bony abnormalities are identified.  IMPRESSION: No evidence of acute cardiopulmonary disease.   Original Report Authenticated By: Lura Em, [name]      Assessment/Plan: #1 chest pain compatible with probable acute coronary syndrome or unstable angina certainly given his risk factors cardiac until proven otherwise.  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 I will arrange this visit.   - goal INR 2-3  - I have consulted SW to arrange for financial assistance w/ lovenox  #7 non-Hodgkin's lymphoma currently in remission no evidence of recurrent disease by CT scanning  Dispo - per cardiology. We will arrange INR and TOC visits after discharge home.    LOS: 2 days   Sharifa Bucholz 03/17/2012, 7:25 AM

## 2012-03-17 NOTE — Progress Notes (Signed)
Subjective: No complaints  Objective: Vital signs in last 24 hours: Temp:  [97.9 F (36.6 C)-98 F (36.7 C)] 98 F (36.7 C) (10/17 0417) Pulse Rate:  [78-80] 79  (10/17 0417) Resp:  [18] 18  (10/17 0417) BP: (125-152)/(71-88) 125/71 mmHg (10/17 0417) SpO2:  [97 %-100 %] 99 % (10/17 0417) Weight change:  Last BM Date: 03/16/12 Intake/Output from previous day: +600 10/16 0701 - 10/17 0700 In: 1200 [P.O.:1200] Out: 600 [Urine:600] Intake/Output this shift:    PE: General:alert and oriented Heart:S1S2 RRR soft murmur Lungs:clear Abd:+ BS, soft, non tender Ext:1+ edema    Lab Results:  Basename 03/17/12 0430 03/16/12 0204  WBC 5.9 5.3  HGB 9.9* 9.9*  HCT 29.6* 29.4*  PLT 207 216   BMET  Basename 03/17/12 0430 03/16/12 0204  NA 141 140  K 3.2* 3.1*  CL 103 101  CO2 26 28  GLUCOSE 101* 110*  BUN 43* 39*  CREATININE 3.24* 2.89*  CALCIUM 9.2 9.4    Basename 03/16/12 0820 03/16/12 0204  TROPONINI <0.30 <0.30    Lab Results  Component Value Date   CHOL 129 01/14/2011   HDL 13* 01/14/2011   LDLCALC 89 01/14/2011   TRIG 137 01/14/2011   CHOLHDL 9.9 01/14/2011   Lab Results  Component Value Date   HGBA1C 8.8* 01/12/2011     Lab Results  Component Value Date   TSH 0.155* 01/14/2011    Hepatic Function Panel No results found for this basename: PROT,ALBUMIN,AST,ALT,ALKPHOS,BILITOT,BILIDIR,IBILI in the last 72 hours No results found for this basename: CHOL in the last 72 hours No results found for this basename: PROTIME in the last 72 hours    EKG: Orders placed during the hospital encounter of 03/15/12  . EKG 12-LEAD  . EKG 12-LEAD  . EKG 12-LEAD  . EKG 12-LEAD  . EKG 12-LEAD  . EKG    Studies/Results: Dg Chest 2 View  03/15/2012  *RADIOLOGY REPORT*  Clinical Data: 61 year old male with chest pain and shortness of breath.  CHEST - 2 VIEW  Comparison: 08/27/2011 and prior chest radiographs  Findings: The cardiomediastinal silhouette is unremarkable. The  lungs are clear. There is no evidence of focal airspace disease, pulmonary edema, suspicious pulmonary nodule/mass, pleural effusion, or pneumothorax. No acute bony abnormalities are identified.  IMPRESSION: No evidence of acute cardiopulmonary disease.   Original Report Authenticated By: Lura Em, [name]     Medications: I have reviewed the patient's current medications.    Marland Kitchen amLODipine  5 mg Oral Daily  . aspirin EC  81 mg Oral Daily  . carvedilol  25 mg Oral BID WC  . docusate sodium  100 mg Oral BID  . furosemide  80 mg Oral BID  . gatifloxacin  1 drop Left Eye TID  . insulin aspart  0-15 Units Subcutaneous Q4H  . insulin aspart protamine-insulin aspart  10 Units Subcutaneous BID WC  . insulin glargine  50 Units Subcutaneous QHS  . iron polysaccharides  150 mg Oral Daily  . isosorbide mononitrate  15 mg Oral Daily  .  morphine injection  2 mg Intravenous Once  . sodium chloride  3 mL Intravenous Q12H  . DISCONTD: calcitRIOL  0.25 mcg Oral Daily  . DISCONTD: enoxaparin (LOVENOX) injection  140 mg Subcutaneous Q24H  . DISCONTD: insulin aspart protamine-insulin aspart  10 Units Subcutaneous BID WC  . DISCONTD: insulin aspart protamine-insulin aspart  10 Units Subcutaneous BID WC  . DISCONTD: insulin aspart protamine-insulin aspart  10 Units Subcutaneous  BID WC  . DISCONTD: Warfarin - Pharmacist Dosing Inpatient   Does not apply q1800   Assessment/Plan: Principal Problem:  *Chest pain Active Problems:  LYMPHOMA  DIABETES MELLITUS-TYPE II  Warfarin-induced coagulopathy  History of DVT of lower extremity  History of cardiac catheterization  Proliferative diabetic retinopathy associated with type 2 diabetes mellitus  Noncompliance  PLAN: on way to [employer]. On IV Heparin, resume coumadin today, INR was sub therapeutic on admit. If nuc study postive will need cardiac cath.  If negative treat muscular pain and ? D/c this pm.  Lexiscan myoview completed, stress portion,  this is actually a 2 day protocol.   He did develop pain, different than ongoing pain.  The new pain, resolved in recovery.  He also had bigeminy freq with test.    LOS: 2 days   INGOLD,LAURA R 03/17/2012, 8:29 AM   Patient seen and examined. Agree with assessment and plan. For 2 day protocol myoview study. If stress portion is normal, he will not need 2nd day study. CP is atypical.   Troy Sine, MD, [facility] 03/17/2012 1:28 PM

## 2012-03-18 ENCOUNTER — Inpatient Hospital Stay (HOSPITAL_COMMUNITY): Payer: Medicare Other

## 2012-03-18 ENCOUNTER — Encounter (HOSPITAL_COMMUNITY): Payer: Self-pay | Admitting: Cardiology

## 2012-03-18 DIAGNOSIS — N189 Chronic kidney disease, unspecified: Secondary | ICD-10-CM | POA: Diagnosis not present

## 2012-03-18 DIAGNOSIS — E119 Type 2 diabetes mellitus without complications: Secondary | ICD-10-CM | POA: Diagnosis not present

## 2012-03-18 DIAGNOSIS — R079 Chest pain, unspecified: Secondary | ICD-10-CM | POA: Diagnosis not present

## 2012-03-18 DIAGNOSIS — N184 Chronic kidney disease, stage 4 (severe): Secondary | ICD-10-CM | POA: Diagnosis present

## 2012-03-18 DIAGNOSIS — I472 Ventricular tachycardia: Secondary | ICD-10-CM | POA: Diagnosis not present

## 2012-03-18 DIAGNOSIS — I4729 Other ventricular tachycardia: Secondary | ICD-10-CM

## 2012-03-18 DIAGNOSIS — R0789 Other chest pain: Secondary | ICD-10-CM | POA: Diagnosis not present

## 2012-03-18 HISTORY — DX: Other ventricular tachycardia: I47.29

## 2012-03-18 HISTORY — DX: Ventricular tachycardia: I47.2

## 2012-03-18 LAB — BASIC METABOLIC PANEL
BUN: 39 mg/dL — ABNORMAL HIGH (ref 6–23)
BUN: 41 mg/dL — ABNORMAL HIGH (ref 6–23)
CO2: 26 mEq/L (ref 19–32)
CO2: 25 mEq/L (ref 19–32)
Calcium: 9.4 mg/dL (ref 8.4–10.5)
Calcium: 9.1 mg/dL (ref 8.4–10.5)
Chloride: 105 mEq/L (ref 96–112)
Chloride: 101 mEq/L (ref 96–112)
Creatinine, Ser: 3.25 mg/dL — ABNORMAL HIGH (ref 0.50–1.35)
Creatinine, Ser: 3.57 mg/dL — ABNORMAL HIGH (ref 0.50–1.35)
GFR calc Af Amer: 22 mL/min — ABNORMAL LOW (ref >90)
GFR calc Af Amer: 20 mL/min — ABNORMAL LOW (ref >90)
GFR calc non Af Amer: 19 mL/min — ABNORMAL LOW (ref >90)
GFR calc non Af Amer: 17 mL/min — ABNORMAL LOW (ref >90)
Glucose, Bld: 84 mg/dL (ref 70–99)
Glucose, Bld: 273 mg/dL — ABNORMAL HIGH (ref 70–99)
Potassium: 3.7 mEq/L (ref 3.5–5.1)
Potassium: 4.2 mEq/L (ref 3.5–5.1)
Sodium: 139 mEq/L (ref 135–145)
Sodium: 137 mEq/L (ref 135–145)

## 2012-03-18 LAB — CBC
HCT: 31.1 % — ABNORMAL LOW (ref 39.0–52.0)
Hemoglobin: 10.2 g/dL — ABNORMAL LOW (ref 13.0–17.0)
MCH: 26.9 pg (ref 26.0–34.0)
MCHC: 32.8 g/dL (ref 30.0–36.0)
MCV: 82.1 fL (ref 78.0–100.0)
Platelets: 243 10*3/uL (ref 150–400)
RBC: 3.79 MIL/uL — ABNORMAL LOW (ref 4.22–5.81)
RDW: 15.6 % — ABNORMAL HIGH (ref 11.5–15.5)
WBC: 5.5 10*3/uL (ref 4.0–10.5)

## 2012-03-18 LAB — GLUCOSE, CAPILLARY
Glucose-Capillary: 101 mg/dL — ABNORMAL HIGH (ref 70–99)
Glucose-Capillary: 133 mg/dL — ABNORMAL HIGH (ref 70–99)
Glucose-Capillary: 147 mg/dL — ABNORMAL HIGH (ref 70–99)
Glucose-Capillary: 206 mg/dL — ABNORMAL HIGH (ref 70–99)
Glucose-Capillary: 290 mg/dL — ABNORMAL HIGH (ref 70–99)

## 2012-03-18 LAB — MAGNESIUM: Magnesium: 2.2 mg/dL (ref 1.5–2.5)

## 2012-03-18 LAB — PROTIME-INR
INR: 1.23 (ref 0.00–1.49)
Prothrombin Time: 15.3 seconds — ABNORMAL HIGH (ref 11.6–15.2)

## 2012-03-18 LAB — PHOSPHORUS: Phosphorus: 3.5 mg/dL (ref 2.3–4.6)

## 2012-03-18 LAB — HEPARIN LEVEL (UNFRACTIONATED): Heparin Unfractionated: 0.49 IU/mL (ref 0.30–0.70)

## 2012-03-18 MED ORDER — POTASSIUM CHLORIDE CRYS ER 10 MEQ PO TBCR
10.0000 meq | EXTENDED_RELEASE_TABLET | Freq: Two times a day (BID) | ORAL | Status: DC
  Administered 2012-03-18: 10 meq via ORAL
  Filled 2012-03-18 (×3): qty 1

## 2012-03-18 MED ORDER — TECHNETIUM TC 99M SESTAMIBI GENERIC - CARDIOLITE
30.0000 | Freq: Once | INTRAVENOUS | Status: AC | PRN
  Administered 2012-03-18: 30 via INTRAVENOUS

## 2012-03-18 MED ORDER — ENOXAPARIN SODIUM 150 MG/ML ~~LOC~~ SOLN
130.0000 mg | SUBCUTANEOUS | Status: DC
  Administered 2012-03-18: 130 mg via SUBCUTANEOUS
  Filled 2012-03-18: qty 1

## 2012-03-18 MED ORDER — INSULIN LISPRO 100 UNIT/ML ~~LOC~~ SOLN: 10.0000 [IU] | Freq: Two times a day (BID) | SUBCUTANEOUS | Status: DC

## 2012-03-18 MED ORDER — ASPIRIN 81 MG PO TBEC: 81.0000 mg | DELAYED_RELEASE_TABLET | Freq: Every day | ORAL | Status: DC

## 2012-03-18 MED ORDER — WARFARIN SODIUM 2.5 MG PO TABS
12.5000 mg | ORAL_TABLET | Freq: Once | ORAL | Status: AC
  Administered 2012-03-18: 12.5 mg via ORAL
  Filled 2012-03-18: qty 1

## 2012-03-18 NOTE — Progress Notes (Signed)
Internal Medicine Consult - Daily Progress Note  Subjective: Patient down for stress test this AM  Objective: Vital signs in last 24 hours: Temp:  [97.5 F (36.4 C)-97.7 F (36.5 C)] 97.7 F (36.5 C) (10/18 0407) Pulse Rate:  [66-80] 66  (10/18 0407) Resp:  [18] 18  (10/17 1330) BP: (113-162)/(47-78) 126/64 mmHg (10/18 0407) SpO2:  [96 %-100 %] 96 % (10/18 0407) Weight:  [294 lb 8.6 oz (133.6 kg)] 294 lb 8.6 oz (133.6 kg) (10/18 0407) Weight change:   CBG (last 3)   Basename 03/18/12 0406 03/17/12 2359 03/17/12 2010  GLUCAP 133* 147* 269*    Intake/Output from previous day: 10/17 0701 - 10/18 0700 In: 4418.5 [P.O.:840; I.V.:3578.5] Out: 2390 [Urine:2390]  Physical Exam: Patient is awake alert no distress.  No scleral icterus.  No JVD or bruits. Trachea midline  Lungs are clear to auscultation w/o rales or wheezes   RRR, 1/6 systolic ejection murmur.  Abdomen is soft nontender good bowel sounds.  Extremities have chronic 1+ edema  Intact distal pulses.   Lab Results:  Basename 03/18/12 0620 03/17/12 0430 03/16/12 0204  NA 139 141 --  K 3.7 3.2* --  CL 105 103 --  CO2 26 26 --  GLUCOSE 84 101* --  BUN 39* 43* --  CREATININE 3.25* 3.24* --  CALCIUM 9.4 9.2 --  MG -- -- 2.2  PHOS 3.5 4.2 --   No results found for this basename: AST:2,ALT:2,ALKPHOS:2,BILITOT:2,PROT:2,ALBUMIN:2 in the last 72 hours  Basename 03/18/12 0620 03/17/12 0430 03/15/12 1735  WBC 5.5 5.9 --  NEUTROABS -- -- 3.6  HGB 10.2* 9.9* --  HCT 31.1* 29.6* --  MCV 82.1 82.2 --  PLT 243 207 --   Lab Results  Component Value Date   INR 1.23 03/18/2012   INR 1.18 03/16/2012   INR 1.11 03/15/2012    Basename 03/16/12 0820 03/16/12 0204 03/15/12 2050  CKTOTAL -- -- --  CKMB -- -- --  CKMBINDEX -- -- --  TROPONINI <0.30 <0.30 <0.30    Studies/Results: No results found.   Assessment/Plan: #1 chest pain compatible with probable acute coronary syndrome or unstable angina certainly  given his risk factors cardiac until proven otherwise.  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 Pt will call to arrange this visit   - case manager is working on the financial aspect of lovenox   - goal INR 2-3   #7 non-Hodgkin's lymphoma currently in remission no evidence of recurrent disease by CT scanning  Dispo - per cardiology. We will arrange INR and TOC visits after discharge home.  Appears patient will be discharged today or tomorrow. We will sign off. Please see above for the only changes to his D/C meds regarding DM and DVT. Please call office w/ questions.    LOS: 3 days   Perlita Forbush 03/18/2012, 8:16 AM

## 2012-03-18 NOTE — Progress Notes (Signed)
ANTICOAGULATION CONSULT NOTE - Follow Up Consult  Pharmacy Consult for Heparin and Coumadin Indication: hx DVT;  r/o PE, r/o ischemia  No Known Allergies  Patient Measurements: Height: 6\' 4"  (193 cm) Weight: 294 lb 8.6 oz (133.6 kg) IBW/kg (Calculated) : 86.8  Heparin Dosing Weight: 115.5 kg  Vital Signs: Temp: 97.7 F (36.5 C) (10/18 0407) Temp src: Oral (10/18 0407) BP: 126/64 mmHg (10/18 0407) Pulse Rate: 66  (10/18 0407)  Labs:  Basename 03/18/12 0620 03/17/12 0430 03/16/12 1935 03/16/12 0820 03/16/12 0204 03/15/12 2050 03/15/12 1735  HGB 10.2* 9.9* -- -- -- -- --  HCT 31.1* 29.6* -- -- 29.4* -- --  PLT 243 207 -- -- 216 -- --  APTT -- -- -- -- -- -- --  LABPROT 15.3* -- -- -- 14.8 -- 14.2  INR 1.23 -- -- -- 1.18 -- 1.11  HEPARINUNFRC 0.49 0.51 0.62 -- -- -- --  CREATININE 3.25* 3.24* -- -- 2.89* -- --  CKTOTAL -- -- -- -- -- -- --  CKMB -- -- -- -- -- -- --  TROPONINI -- -- -- <0.30 <0.30 <0.30 --    Estimated Creatinine Clearance: 36.5 ml/min (by C-G formula based on Cr of 3.25).  Assessment:   Heparin level remains therapeutic on 1500 units/hr.   Coumadin resumed with 12.5 mg on 10/17.  INR 1.23. Home Coumadin regimen: 10 mg daily except 12.5 mg on Mondays and Fridays.  Second portion of 2-day stress test today.  May go home if negative.  Plan noted for Lovenox bridge. Creatinine clearance just >30 ml/min, the cut-off for q24hr versus q24hr dosing.  Goal of Therapy:  Heparin level 0.3-0.7 units/ml Monitor platelets by anticoagulation protocol: Yes   Plan:   Continue Heparin drip at 1500 units/hr.  Repeat Coumadin 12.5 mg today, usual Friday dose.  Lovenox 130-135 mg SQ q24h is reasonable for outpatient use (syringe size is 150 mg). Could continue Coumadin 12.5 mg daily through the weekend, then check PT/INR on Monday.   Arty Baumgartner, [place] Pager: [phone] 03/18/2012,1:56 PM

## 2012-03-18 NOTE — Progress Notes (Signed)
Inpatient Diabetes Program Recommendations  AACE/ADA: New Consensus Statement on Inpatient Glycemic Control (2013)  Target Ranges:  Prepandial:   less than 140 mg/dL      Peak postprandial:   less than 180 mg/dL (1-2 hours)      Critically ill patients:  140 - 180 mg/dL   Reason for Visit: Post-prandial hyperglycemia Fasting glucose level is good. The post-prandial cbg's are elevated.  Please consider increase in am 70/30 to 20-30 units (takes 80 units bid).    Note: Thank you, Rosita Kea, RN, CNS, Diabetes Coordinator [phone])

## 2012-03-18 NOTE — Discharge Summary (Signed)
Physician Discharge Summary  Patient ID: [name] MRN: [id number] DOB/AGE: 1950-07-28 61 y.o.  Admit date: 03/15/2012 Discharge date: 03/18/2012  Discharge Diagnoses:  Principal Problem:  *Chest pain, no MI, negative myoview, most likely muscular sketal pain Active Problems:  LYMPHOMA  DIABETES MELLITUS-TYPE II  Warfarin-induced coagulopathy  History of DVT of lower extremity  History of cardiac catheterization  Proliferative diabetic retinopathy associated with type 2 diabetes mellitus  Noncompliance  CKD (chronic kidney disease) stage 4, GFR 15-29 ml/min   Discharged Condition: good  Hospital Course: 61 y.o. male with a past medical history significant for diabetes and hypertension. He had a heart catheterization in 2010 which revealed normal coronary arteries. Echocardiogram in August of 2012 showed an ejection fraction of 55-60% with moderate LVH. He has not seen a cardiologist as an outpatient. Unfortunately according to Dr. Jacquiline Doe notes he said questionable compliance in the past usually secondary to financial issues. 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  [facility]ed to [facility] secondary to chest pain, sharp and substernal to L breast. Some radiation down his left arm. No N/V or diaphoresis. He missed his meds this past weekend. He is on coumadin for DVT, sub therapeutic on admit.    Pt was transferred from [facility] to [facility] and Pt. Evaluated by cardiology.  Dr. Ellyn Hack saw the patient and felt that due to the sharp pain and increase of pain with deep inspiration.  But due to risk factors it was felt necessary to evaluate for ischemia.  Lexiscan myoview done as 2 day study per protocol was negative for ischemia or scar.  EF was 58%.      Pt was ready for discharge and had 9 beats of NSVT.  Labs will be rechecked and if stable to be discharged.  He cannot afford his Lovenox.  Discussed with Dr. Gwenlyn Found ok to let  coumadin drift up, he has not had a DVT for some time.  We will arrange an out patient monitor for the patient.  We will see back if abnormal monitor.  He will follow up with Dr. Reynaldo Minium otherwise.    Consults: cardiology  Significant Diagnostic Studies:  BMET    Component Value Date/Time   NA 139 03/18/2012 0620   K 3.7 03/18/2012 0620   CL 105 03/18/2012 0620   CO2 26 03/18/2012 0620   GLUCOSE 84 03/18/2012 0620   BUN 39* 03/18/2012 0620   CREATININE 3.25* 03/18/2012 0620   CREATININE 4.44* 01/14/2011 1428   CALCIUM 9.4 03/18/2012 0620   CALCIUM 7.8* 01/14/2011 1001   GFRNONAA 19* 03/18/2012 0620   GFRAA 22* 03/18/2012 0620    CBC    Component Value Date/Time   WBC 5.5 03/18/2012 0620   WBC 5.2 09/29/2011 1230   RBC 3.79* 03/18/2012 0620   RBC 3.49* 09/29/2011 1230   HGB 10.2* 03/18/2012 0620   HGB 9.4* 09/29/2011 1230   HCT 31.1* 03/18/2012 0620   HCT 28.9* 09/29/2011 1230   PLT 243 03/18/2012 0620   PLT 230 09/29/2011 1230   MCV 82.1 03/18/2012 0620   MCV 82.6 09/29/2011 1230   MCH 26.9 03/18/2012 0620   MCH 27.0* 09/29/2011 1230   MCHC 32.8 03/18/2012 0620   MCHC 32.6 09/29/2011 1230   RDW 15.6* 03/18/2012 0620   RDW 17.2* 09/29/2011 1230   LYMPHSABS 1.5 03/15/2012 1735   LYMPHSABS 1.2 09/29/2011 1230   MONOABS 0.3 03/15/2012 1735   MONOABS 0.5  09/29/2011 1230   EOSABS 0.1 03/15/2012 1735   EOSABS 0.2 09/29/2011 1230   BASOSABS 0.0 03/15/2012 1735   BASOSABS 0.0 09/29/2011 1230    Negative cardiac enzymes.     Discharge Exam: Blood pressure 138/77, pulse 79, temperature 98.4 F (36.9 C), temperature source Oral, resp. rate 18, height 6\' 4"  (1.93 m), weight 133.6 kg (294 lb 8.6 oz), SpO2 98.00%.   Patient is awake alert no distress.  No scleral icterus.  No JVD or bruits. Trachea midline  Lungs are clear to auscultation w/o rales or wheezes  RRR, 1/6 systolic ejection murmur.  Abdomen is soft nontender good bowel sounds.  Extremities have chronic 1+ edema    Intact distal pulses  Disposition: 01-Home or Self Care  Discharge Orders    Future Appointments: Provider: Department: Dept Phone: Center:   06/22/2012 12:30 PM Hayden Pedro, MD [facility] [phone] None       Medication List     As of 03/18/2012  4:41 PM    TAKE these medications         acetaminophen 500 MG tablet   Commonly known as: TYLENOL   Take 1,000 mg by mouth every 6 (six) hours as needed. For headache      amLODipine 5 MG tablet   Commonly known as: NORVASC   Take 1 tablet (5 mg total) by mouth daily.      aspirin 81 MG EC tablet   Take 1 tablet (81 mg total) by mouth daily.      calcitRIOL 0.25 MCG capsule   Commonly known as: ROCALTROL   Take 0.25 mcg by mouth daily.      carvedilol 25 MG tablet   Commonly known as: COREG   Take 25 mg by mouth 2 (two) times daily with a meal.      furosemide 80 MG tablet   Commonly known as: LASIX   Take 1 tablet (80 mg total) by mouth 2 (two) times daily.      gatifloxacin 0.5 % Soln   Commonly known as: ZYMAXID   Place 1 drop into the left eye 3 (three) times daily.      insulin glargine 100 UNIT/ML injection   Commonly known as: LANTUS   Inject 50 Units into the skin at bedtime.      insulin lispro 100 UNIT/ML injection   Commonly known as: HUMALOG   Inject 10 Units into the skin 2 (two) times daily at 10 am and 4 pm.      polysaccharide iron 150 MG capsule   Generic drug: iron polysaccharides   Take 1 capsule (150 mg total) by mouth daily.      warfarin 5 MG tablet   Commonly known as: COUMADIN   Take 10-12.5 mg by mouth daily. 2 tab daily except for 2.5 tabs daily on Mondays and Fridays.           Follow-up Information    Follow up with Leonie Man, MD. On 03/18/2012. (call our office if needed for chest pain.)    Contact information:   [facility] [address] [place] [address] [phone]       Follow up with ARONSON,RICHARD A, MD.  (call Dr. Jacquiline Doe office for coumadin check on Monday and follow up with your diabetes )    Contact information:   [facility] [place] [address] [phone]        Discharge Instructions: Your stress  test was normal, the pain is chest wall pain.  Take tylenol if needed for pain.    Coumadin as before, call Dr. Jacquiline Doe office Monday to have your level checked on Monday-to have checked on Monday.  Watch diet, heart Healthy diabetic diet.  SignedIsaiah Serge 03/18/2012, 4:41 PM

## 2012-03-18 NOTE — Progress Notes (Signed)
Notified by RN that pt was to d/c on Lovenox 130 mg qd. Per pharmacist, will need 5 day supply.  Discussed with pt who has self-injected Lovenox previously, Rxs filled @ [facility].  Rx called to [place], copay will be $123.78 - provided information to pt.

## 2012-03-18 NOTE — Progress Notes (Signed)
Pt discharged at 2031, escorted by NT on a wheelchair to home. Brother driving pt. Discharge instruction given and explained per protocol. Appointments verified. Pt signed doc and verbalized understanding.

## 2012-03-18 NOTE — Progress Notes (Signed)
Subjective:   Objective: Vital signs in last 24 hours: Temp:  [97.5 F (36.4 C)-97.7 F (36.5 C)] 97.7 F (36.5 C) (10/18 0407) Pulse Rate:  [66-80] 66  (10/18 0407) Resp:  [18] 18  (10/17 1330) BP: (113-162)/(47-78) 126/64 mmHg (10/18 0407) SpO2:  [96 %-100 %] 96 % (10/18 0407) Weight:  [133.6 kg (294 lb 8.6 oz)] 133.6 kg (294 lb 8.6 oz) (10/18 0407) Weight change:  Last BM Date: 03/16/12 Intake/Output from previous day: +2028 10/17 0701 - 10/18 0700 In: 4418.5 [P.O.:840; I.V.:3578.5] Out: 2390 [Urine:2390] Intake/Output this shift:    PE:  Currently unable to examine, pt in Nuclear scanner General: Heart: Lungs: Abd: Ext:   Lab Results:  Basename 03/18/12 0620 03/17/12 0430  WBC 5.5 5.9  HGB 10.2* 9.9*  HCT 31.1* 29.6*  PLT 243 207   BMET  Basename 03/18/12 0620 03/17/12 0430  NA 139 141  K 3.7 3.2*  CL 105 103  CO2 26 26  GLUCOSE 84 101*  BUN 39* 43*  CREATININE 3.25* 3.24*  CALCIUM 9.4 9.2    Basename 03/16/12 0820 03/16/12 0204  TROPONINI <0.30 <0.30    Lab Results  Component Value Date   CHOL 129 01/14/2011   HDL 13* 01/14/2011   LDLCALC 89 01/14/2011   TRIG 137 01/14/2011   CHOLHDL 9.9 01/14/2011   Lab Results  Component Value Date   HGBA1C 8.8* 01/12/2011     Lab Results  Component Value Date   TSH 0.155* 01/14/2011      EKG: Orders placed during the hospital encounter of 03/15/12  . EKG 12-LEAD  . EKG 12-LEAD  . EKG 12-LEAD  . EKG 12-LEAD  . EKG 12-LEAD  . EKG    Studies/Results: No results found.  Medications: I have reviewed the patient's current medications.    Marland Kitchen amLODipine  5 mg Oral Daily  . aspirin EC  81 mg Oral Daily  . carvedilol  25 mg Oral BID WC  . docusate sodium  100 mg Oral BID  . furosemide  80 mg Oral BID  . gatifloxacin  1 drop Left Eye TID  . insulin aspart  0-15 Units Subcutaneous Q4H  . insulin aspart protamine-insulin aspart  10 Units Subcutaneous BID WC  . insulin glargine  50 Units Subcutaneous  QHS  . iron polysaccharides  150 mg Oral Daily  . isosorbide mononitrate  15 mg Oral Daily  .  morphine injection  2 mg Intravenous Once  . potassium chloride  40 mEq Oral Once  . potassium chloride  40 mEq Oral Once  . regadenoson      . regadenoson  0.4 mg Intravenous Once  . sodium chloride  3 mL Intravenous Q12H  . warfarin  12.5 mg Oral ONCE-1800  . Warfarin - Pharmacist Dosing Inpatient   Does not apply q1800   Assessment/Plan: Principal Problem:  *Chest pain Active Problems:  LYMPHOMA  DIABETES MELLITUS-TYPE II  Warfarin-induced coagulopathy  History of DVT of lower extremity  History of cardiac catheterization  Proliferative diabetic retinopathy associated with type 2 diabetes mellitus  Noncompliance  CKD (chronic kidney disease) stage 4, GFR 15-29 ml/min  PLAN:Resting portion of lexiscan for today, unable to read stress portion, study was not up to review last pm.   Heparin coumadin crossover.     CKD IV fluids have been at 75cc hr with rise in Cr. In Oct. 2012 Cr. 2.71 though has been up to 5.06 in 12/2010--have decreased fluids. Has seen Renal in the  past.  LOS: 3 days   INGOLD,LAURA R 03/18/2012, 8:16 AM  Agree with note written by Cecilie Kicks RNP  No further CP. Enz neg. SCr high making cath high risk for RCN. Pt on iv hep to coumadin cross over for h/o remote DVT. He had second portion of stress myoview 2 day protocol this AM. Will review. If neg, D/C home. Lovenox bridge. ROV with Ralston J 03/18/2012 9:14 AM

## 2012-03-24 DIAGNOSIS — Z7901 Long term (current) use of anticoagulants: Secondary | ICD-10-CM | POA: Diagnosis not present

## 2012-03-29 DIAGNOSIS — I472 Ventricular tachycardia: Secondary | ICD-10-CM | POA: Diagnosis not present

## 2012-03-29 DIAGNOSIS — I4729 Other ventricular tachycardia: Secondary | ICD-10-CM | POA: Diagnosis not present

## 2012-03-29 DIAGNOSIS — R000 Tachycardia, unspecified: Secondary | ICD-10-CM | POA: Diagnosis not present

## 2012-03-31 DIAGNOSIS — R079 Chest pain, unspecified: Secondary | ICD-10-CM | POA: Diagnosis not present

## 2012-03-31 DIAGNOSIS — I10 Essential (primary) hypertension: Secondary | ICD-10-CM | POA: Diagnosis not present

## 2012-03-31 DIAGNOSIS — E119 Type 2 diabetes mellitus without complications: Secondary | ICD-10-CM | POA: Diagnosis not present

## 2012-04-04 DIAGNOSIS — N184 Chronic kidney disease, stage 4 (severe): Secondary | ICD-10-CM | POA: Diagnosis not present

## 2012-04-04 DIAGNOSIS — R5381 Other malaise: Secondary | ICD-10-CM | POA: Diagnosis not present

## 2012-04-04 DIAGNOSIS — R5383 Other fatigue: Secondary | ICD-10-CM | POA: Diagnosis not present

## 2012-04-06 DIAGNOSIS — Z7901 Long term (current) use of anticoagulants: Secondary | ICD-10-CM | POA: Diagnosis not present

## 2012-04-06 DIAGNOSIS — I82409 Acute embolism and thrombosis of unspecified deep veins of unspecified lower extremity: Secondary | ICD-10-CM | POA: Diagnosis not present

## 2012-04-11 DIAGNOSIS — H2510 Age-related nuclear cataract, unspecified eye: Secondary | ICD-10-CM | POA: Diagnosis not present

## 2012-04-11 DIAGNOSIS — Z961 Presence of intraocular lens: Secondary | ICD-10-CM | POA: Diagnosis not present

## 2012-04-11 DIAGNOSIS — H35039 Hypertensive retinopathy, unspecified eye: Secondary | ICD-10-CM | POA: Diagnosis not present

## 2012-04-11 DIAGNOSIS — H4310 Vitreous hemorrhage, unspecified eye: Secondary | ICD-10-CM | POA: Diagnosis not present

## 2012-04-11 DIAGNOSIS — E11359 Type 2 diabetes mellitus with proliferative diabetic retinopathy without macular edema: Secondary | ICD-10-CM | POA: Diagnosis not present

## 2012-04-14 ENCOUNTER — Other Ambulatory Visit (HOSPITAL_COMMUNITY): Payer: Self-pay | Admitting: *Deleted

## 2012-04-15 ENCOUNTER — Encounter (HOSPITAL_COMMUNITY)
Admission: RE | Admit: 2012-04-15 | Discharge: 2012-04-15 | Disposition: A | Discharge status: Home / Self Care | Payer: Medicare Other | Source: Ambulatory Visit | Attending: Nephrology | Admitting: Nephrology

## 2012-04-15 DIAGNOSIS — D638 Anemia in other chronic diseases classified elsewhere: Secondary | ICD-10-CM | POA: Insufficient documentation

## 2012-04-15 DIAGNOSIS — N184 Chronic kidney disease, stage 4 (severe): Secondary | ICD-10-CM | POA: Diagnosis not present

## 2012-04-15 LAB — FERRITIN: Ferritin: 183 ng/mL (ref 22–322)

## 2012-04-15 LAB — IRON AND TIBC
Iron: 69 ug/dL (ref 42–135)
Saturation Ratios: 23 % (ref 20–55)
TIBC: 299 ug/dL (ref 215–435)
UIBC: 230 ug/dL (ref 125–400)

## 2012-04-15 LAB — POCT HEMOGLOBIN-HEMACUE: Hemoglobin: 10.3 g/dL — ABNORMAL LOW (ref 13.0–17.0)

## 2012-04-15 MED ORDER — EPOETIN ALFA 20000 UNIT/ML IJ SOLN
20000.0000 [IU] | INTRAMUSCULAR | Status: DC
  Administered 2012-04-15: 20000 [IU] via SUBCUTANEOUS

## 2012-04-15 MED ORDER — EPOETIN ALFA 20000 UNIT/ML IJ SOLN
INTRAMUSCULAR | Status: AC
  Filled 2012-04-15: qty 1

## 2012-04-16 DIAGNOSIS — I82409 Acute embolism and thrombosis of unspecified deep veins of unspecified lower extremity: Secondary | ICD-10-CM | POA: Diagnosis not present

## 2012-04-16 DIAGNOSIS — R079 Chest pain, unspecified: Secondary | ICD-10-CM | POA: Diagnosis not present

## 2012-04-16 DIAGNOSIS — I472 Ventricular tachycardia: Secondary | ICD-10-CM | POA: Diagnosis not present

## 2012-04-16 DIAGNOSIS — I4729 Other ventricular tachycardia: Secondary | ICD-10-CM | POA: Diagnosis not present

## 2012-04-16 DIAGNOSIS — N184 Chronic kidney disease, stage 4 (severe): Secondary | ICD-10-CM | POA: Diagnosis not present

## 2012-04-19 ENCOUNTER — Encounter (INDEPENDENT_AMBULATORY_CARE_PROVIDER_SITE_OTHER): Payer: Medicare Other | Admitting: Ophthalmology

## 2012-04-19 DIAGNOSIS — E1165 Type 2 diabetes mellitus with hyperglycemia: Secondary | ICD-10-CM

## 2012-04-19 DIAGNOSIS — H4310 Vitreous hemorrhage, unspecified eye: Secondary | ICD-10-CM

## 2012-04-19 DIAGNOSIS — H3340 Traction detachment of retina, unspecified eye: Secondary | ICD-10-CM

## 2012-04-19 DIAGNOSIS — I10 Essential (primary) hypertension: Secondary | ICD-10-CM

## 2012-04-19 DIAGNOSIS — H43819 Vitreous degeneration, unspecified eye: Secondary | ICD-10-CM

## 2012-04-19 DIAGNOSIS — E1139 Type 2 diabetes mellitus with other diabetic ophthalmic complication: Secondary | ICD-10-CM

## 2012-04-19 DIAGNOSIS — E11359 Type 2 diabetes mellitus with proliferative diabetic retinopathy without macular edema: Secondary | ICD-10-CM

## 2012-04-19 DIAGNOSIS — H2510 Age-related nuclear cataract, unspecified eye: Secondary | ICD-10-CM

## 2012-04-19 DIAGNOSIS — H35039 Hypertensive retinopathy, unspecified eye: Secondary | ICD-10-CM

## 2012-04-19 NOTE — H&P (Signed)
[name] is an 61 y.o. male.   Chief Complaint:Acute vision loss  Right eye HPI: Longstanding diabetes mellitus and hypertension  Past Medical History  Diagnosis Date  . Cancer   . Diabetes mellitus   . DVT (deep venous thrombosis)     Right leg  . Diabetic retinopathy(362.0)   . Hypertension   . Hyperlipidemia   . Non Hodgkin's lymphoma     Tx 2009  . History of cardiac catheterization     pt denies this on 03/15/2012  . Nodular lymphoma of intra-abdominal lymph nodes   . Shortness of breath   . Sleep apnea     "suppose to have a sleep studfy, but they never told me when.  . Peripheral vascular disease   . Blood transfusion   . 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  Marland Kitchen Headache   . Family history of anesthesia complication     " my son cant have it because he cant wake up "  . Noncompliance 03/16/2012  . CKD (chronic kidney disease) stage 4, GFR 15-29 ml/min 03/18/2012  . NSVT (nonsustained ventricular tachycardia) 03/18/2012    Past Surgical History  Procedure Date  . Porta catheter   . Porta catheter insertion   . Porta catheter removed   . Coloscopy   . Pars plana vitrectomy 08/27/2011    Procedure: PARS PLANA VITRECTOMY WITH 25 GAUGE;  Surgeon: Hayden Pedro, MD;  Location: [facility];  Service: Ophthalmology;  Laterality: Left;  Repair of complex traction retinal detachment left eye  . Cardiac catheterization     pt denies this on 03/15/2012    Family History  Problem Relation Age of Onset  . Anesthesia problems Son    Social History:  reports that he has never smoked. He has never used smokeless tobacco. He reports that he does not drink alcohol or use illicit drugs.  Allergies: No Known Allergies  No prescriptions prior to admission    Review of systems otherwise negative  There were no vitals taken for this visit.  Physical exam: Mental status: oriented x3. Eyes: See eye exam associated with this date of surgery in media tab.   Scanned in by scanning center Ears, Nose, Throat: within normal limits Neck: Within Normal limits General: within normal limits Chest: Within normal limits Breast: deferred Heart: Within normal limits Abdomen: Within normal limits GU: deferred Extremities: within normal limits Skin: within normal limits  Assessment/Plan Vitreous hemorrhage and traction retinal detachment right eye Plan: To [facility] for Pars plana vitrectomy with membrane peel, laser treatment, gas injection right eye.  Hayden Pedro 04/19/2012, 2:31 PM 2

## 2012-04-29 ENCOUNTER — Encounter (HOSPITAL_COMMUNITY)
Admission: RE | Admit: 2012-04-29 | Discharge: 2012-04-29 | Disposition: A | Discharge status: Home / Self Care | Payer: Medicare Other | Source: Ambulatory Visit | Attending: Nephrology | Admitting: Nephrology

## 2012-04-29 DIAGNOSIS — N184 Chronic kidney disease, stage 4 (severe): Secondary | ICD-10-CM | POA: Diagnosis not present

## 2012-04-29 DIAGNOSIS — D638 Anemia in other chronic diseases classified elsewhere: Secondary | ICD-10-CM | POA: Diagnosis not present

## 2012-04-29 LAB — POCT HEMOGLOBIN-HEMACUE: Hemoglobin: 11.5 g/dL — ABNORMAL LOW (ref 13.0–17.0)

## 2012-04-29 MED ORDER — EPOETIN ALFA 20000 UNIT/ML IJ SOLN: 20000.0000 [IU] | INTRAMUSCULAR | Status: DC

## 2012-05-04 DIAGNOSIS — I82409 Acute embolism and thrombosis of unspecified deep veins of unspecified lower extremity: Secondary | ICD-10-CM | POA: Diagnosis not present

## 2012-05-04 DIAGNOSIS — Z7901 Long term (current) use of anticoagulants: Secondary | ICD-10-CM | POA: Diagnosis not present

## 2012-05-09 ENCOUNTER — Encounter (HOSPITAL_COMMUNITY): Payer: Self-pay | Admitting: *Deleted

## 2012-05-09 MED ORDER — DEXTROSE 5 % IV SOLN
3.0000 g | INTRAVENOUS | Status: AC
  Administered 2012-05-10: 3 g via INTRAVENOUS
  Filled 2012-05-09: qty 3000

## 2012-05-09 MED ORDER — MUPIROCIN 2 % EX OINT: TOPICAL_OINTMENT | Freq: Two times a day (BID) | CUTANEOUS | Status: DC

## 2012-05-09 MED ORDER — CEFAZOLIN SODIUM-DEXTROSE 2-3 GM-% IV SOLR: 2.0000 g | INTRAVENOUS | Status: DC

## 2012-05-09 NOTE — Progress Notes (Signed)
05/09/12 1504  OBSTRUCTIVE SLEEP APNEA  Have you ever been diagnosed with sleep apnea through a sleep study? No  If yes, do you have and use a CPAP or BPAP machine every night? 0  Do you snore loudly (loud enough to be heard through closed doors)?  1  Do you often feel tired, fatigued, or sleepy during the daytime? 1  Has anyone observed you stop breathing during your sleep? 0  Do you have, or are you being treated for high blood pressure? 1  BMI more than 35 kg/m2? 1  Age over 66 years old? 1  Neck circumference greater than 40 cm/18 inches? 1  Gender: 1  Obstructive Sleep Apnea Score 7

## 2012-05-09 NOTE — Progress Notes (Signed)
I faxed a request to Dr Kennon Holter office requesting last office notes.

## 2012-05-10 ENCOUNTER — Ambulatory Visit (HOSPITAL_COMMUNITY): Payer: Medicare Other | Admitting: Certified Registered Nurse Anesthetist

## 2012-05-10 ENCOUNTER — Encounter (HOSPITAL_COMMUNITY): Payer: Self-pay | Admitting: Certified Registered Nurse Anesthetist

## 2012-05-10 ENCOUNTER — Encounter (HOSPITAL_COMMUNITY): Payer: Self-pay | Admitting: *Deleted

## 2012-05-10 ENCOUNTER — Ambulatory Visit (HOSPITAL_COMMUNITY)
Admission: RE | Admit: 2012-05-10 | Discharge: 2012-05-11 | Disposition: A | Discharge status: Home / Self Care | Payer: Medicare Other | Source: Ambulatory Visit | Attending: Ophthalmology | Admitting: Ophthalmology

## 2012-05-10 ENCOUNTER — Encounter (HOSPITAL_COMMUNITY): Payer: Self-pay | Admitting: General Practice

## 2012-05-10 ENCOUNTER — Encounter (HOSPITAL_COMMUNITY)
Admission: RE | Disposition: A | Discharge status: Home / Self Care | Payer: Self-pay | Source: Ambulatory Visit | Attending: Ophthalmology

## 2012-05-10 DIAGNOSIS — I4729 Other ventricular tachycardia: Secondary | ICD-10-CM | POA: Diagnosis not present

## 2012-05-10 DIAGNOSIS — H4310 Vitreous hemorrhage, unspecified eye: Secondary | ICD-10-CM | POA: Insufficient documentation

## 2012-05-10 DIAGNOSIS — N184 Chronic kidney disease, stage 4 (severe): Secondary | ICD-10-CM | POA: Insufficient documentation

## 2012-05-10 DIAGNOSIS — I472 Ventricular tachycardia, unspecified: Secondary | ICD-10-CM | POA: Insufficient documentation

## 2012-05-10 DIAGNOSIS — E11359 Type 2 diabetes mellitus with proliferative diabetic retinopathy without macular edema: Secondary | ICD-10-CM | POA: Diagnosis not present

## 2012-05-10 DIAGNOSIS — I129 Hypertensive chronic kidney disease with stage 1 through stage 4 chronic kidney disease, or unspecified chronic kidney disease: Secondary | ICD-10-CM | POA: Insufficient documentation

## 2012-05-10 DIAGNOSIS — E1139 Type 2 diabetes mellitus with other diabetic ophthalmic complication: Secondary | ICD-10-CM | POA: Insufficient documentation

## 2012-05-10 DIAGNOSIS — E785 Hyperlipidemia, unspecified: Secondary | ICD-10-CM | POA: Diagnosis not present

## 2012-05-10 DIAGNOSIS — E1129 Type 2 diabetes mellitus with other diabetic kidney complication: Secondary | ICD-10-CM | POA: Insufficient documentation

## 2012-05-10 DIAGNOSIS — H3340 Traction detachment of retina, unspecified eye: Secondary | ICD-10-CM | POA: Insufficient documentation

## 2012-05-10 DIAGNOSIS — G4710 Hypersomnia, unspecified: Secondary | ICD-10-CM | POA: Diagnosis not present

## 2012-05-10 DIAGNOSIS — C8589 Other specified types of non-Hodgkin lymphoma, extranodal and solid organ sites: Secondary | ICD-10-CM

## 2012-05-10 DIAGNOSIS — G4730 Sleep apnea, unspecified: Secondary | ICD-10-CM | POA: Insufficient documentation

## 2012-05-10 DIAGNOSIS — I10 Essential (primary) hypertension: Secondary | ICD-10-CM | POA: Diagnosis not present

## 2012-05-10 HISTORY — PX: PARS PLANA VITRECTOMY: SHX2166

## 2012-05-10 HISTORY — PX: MEMBRANE PEEL: SHX5967

## 2012-05-10 HISTORY — PX: GAS INSERTION: SHX5336

## 2012-05-10 HISTORY — PX: PHOTOCOAGULATION WITH LASER: SHX6027

## 2012-05-10 LAB — CBC
HCT: 31.2 % — ABNORMAL LOW (ref 39.0–52.0)
Hemoglobin: 10.3 g/dL — ABNORMAL LOW (ref 13.0–17.0)
MCH: 26.9 pg (ref 26.0–34.0)
MCHC: 33 g/dL (ref 30.0–36.0)
MCV: 81.5 fL (ref 78.0–100.0)
Platelets: 274 10*3/uL (ref 150–400)
RBC: 3.83 MIL/uL — ABNORMAL LOW (ref 4.22–5.81)
RDW: 14.5 % (ref 11.5–15.5)
WBC: 6.7 10*3/uL (ref 4.0–10.5)

## 2012-05-10 LAB — BASIC METABOLIC PANEL
BUN: 43 mg/dL — ABNORMAL HIGH (ref 6–23)
CO2: 30 mEq/L (ref 19–32)
Calcium: 9.7 mg/dL (ref 8.4–10.5)
Chloride: 90 mEq/L — ABNORMAL LOW (ref 96–112)
Creatinine, Ser: 3.38 mg/dL — ABNORMAL HIGH (ref 0.50–1.35)
GFR calc Af Amer: 21 mL/min — ABNORMAL LOW (ref >90)
GFR calc non Af Amer: 18 mL/min — ABNORMAL LOW (ref >90)
Glucose, Bld: 526 mg/dL — ABNORMAL HIGH (ref 70–99)
Potassium: 4.4 mEq/L (ref 3.5–5.1)
Sodium: 134 mEq/L — ABNORMAL LOW (ref 135–145)

## 2012-05-10 LAB — GLUCOSE, CAPILLARY
Glucose-Capillary: 235 mg/dL — ABNORMAL HIGH (ref 70–99)
Glucose-Capillary: 381 mg/dL — ABNORMAL HIGH (ref 70–99)
Glucose-Capillary: 406 mg/dL — ABNORMAL HIGH (ref 70–99)
Glucose-Capillary: 412 mg/dL — ABNORMAL HIGH (ref 70–99)
Glucose-Capillary: 470 mg/dL — ABNORMAL HIGH (ref 70–99)
Glucose-Capillary: 484 mg/dL — ABNORMAL HIGH (ref 70–99)
Glucose-Capillary: 499 mg/dL — ABNORMAL HIGH (ref 70–99)

## 2012-05-10 LAB — SURGICAL PCR SCREEN
MRSA, PCR: NEGATIVE
Staphylococcus aureus: NEGATIVE

## 2012-05-10 LAB — PROTIME-INR
INR: 1.15 (ref 0.00–1.49)
Prothrombin Time: 14.5 seconds (ref 11.6–15.2)

## 2012-05-10 LAB — APTT: aPTT: 32 seconds (ref 24–37)

## 2012-05-10 SURGERY — PARS PLANA VITRECTOMY WITH 25 GAUGE
Anesthesia: General | Site: Eye | Laterality: Right | Wound class: Clean

## 2012-05-10 MED ORDER — SODIUM CHLORIDE 0.9 % IJ SOLN
INTRAMUSCULAR | Status: DC | PRN
  Administered 2012-05-10: 11:00:00

## 2012-05-10 MED ORDER — PHENYLEPHRINE HCL 2.5 % OP SOLN
1.0000 [drp] | OPHTHALMIC | Status: AC | PRN
  Administered 2012-05-10 (×3): 1 [drp] via OPHTHALMIC
  Filled 2012-05-10: qty 3

## 2012-05-10 MED ORDER — BUPIVACAINE HCL (PF) 0.75 % IJ SOLN
INTRAMUSCULAR | Status: DC | PRN
  Administered 2012-05-10: 10 mL

## 2012-05-10 MED ORDER — BSS PLUS IO SOLN
INTRAOCULAR | Status: DC | PRN
  Administered 2012-05-10: 1 via INTRAOCULAR

## 2012-05-10 MED ORDER — BSS IO SOLN
INTRAOCULAR | Status: DC | PRN
  Administered 2012-05-10: 15 mL via INTRAOCULAR

## 2012-05-10 MED ORDER — BACITRACIN-POLYMYXIN B 500-10000 UNIT/GM OP OINT
TOPICAL_OINTMENT | OPHTHALMIC | Status: AC
  Filled 2012-05-10: qty 3.5

## 2012-05-10 MED ORDER — MAGNESIUM HYDROXIDE 400 MG/5ML PO SUSP
15.0000 mL | Freq: Four times a day (QID) | ORAL | Status: DC | PRN
  Administered 2012-05-10: 30 mL via ORAL
  Filled 2012-05-10: qty 30

## 2012-05-10 MED ORDER — MEPERIDINE HCL 25 MG/ML IJ SOLN: 6.2500 mg | INTRAMUSCULAR | Status: DC | PRN

## 2012-05-10 MED ORDER — BRIMONIDINE TARTRATE 0.2 % OP SOLN
1.0000 [drp] | Freq: Two times a day (BID) | OPHTHALMIC | Status: DC
  Filled 2012-05-10: qty 5

## 2012-05-10 MED ORDER — INSULIN GLARGINE 100 UNIT/ML ~~LOC~~ SOLN
50.0000 [IU] | Freq: Every day | SUBCUTANEOUS | Status: DC
  Administered 2012-05-10: 50 [IU] via SUBCUTANEOUS

## 2012-05-10 MED ORDER — CALCITRIOL 0.25 MCG PO CAPS
0.2500 ug | ORAL_CAPSULE | Freq: Every day | ORAL | Status: DC
  Filled 2012-05-10 (×2): qty 1

## 2012-05-10 MED ORDER — ATROPINE SULFATE 1 % OP SOLN
OPHTHALMIC | Status: AC
  Filled 2012-05-10: qty 2

## 2012-05-10 MED ORDER — GATIFLOXACIN 0.5 % OP SOLN: 1.0000 [drp] | Freq: Four times a day (QID) | OPHTHALMIC | Status: DC

## 2012-05-10 MED ORDER — BUPIVACAINE HCL (PF) 0.75 % IJ SOLN
INTRAMUSCULAR | Status: AC
  Filled 2012-05-10: qty 10

## 2012-05-10 MED ORDER — ACETAZOLAMIDE SODIUM 500 MG IJ SOLR
INTRAMUSCULAR | Status: AC
  Filled 2012-05-10: qty 500

## 2012-05-10 MED ORDER — ACETAZOLAMIDE SODIUM 500 MG IJ SOLR
500.0000 mg | Freq: Once | INTRAMUSCULAR | Status: AC
  Administered 2012-05-11: 500 mg via INTRAVENOUS
  Filled 2012-05-10: qty 500

## 2012-05-10 MED ORDER — GENTAMICIN SULFATE 40 MG/ML IJ SOLN
INTRAMUSCULAR | Status: AC
  Filled 2012-05-10: qty 2

## 2012-05-10 MED ORDER — GATIFLOXACIN 0.5 % OP SOLN
1.0000 [drp] | Freq: Three times a day (TID) | OPHTHALMIC | Status: DC
  Administered 2012-05-10: 1 [drp] via OPHTHALMIC
  Filled 2012-05-10: qty 2.5

## 2012-05-10 MED ORDER — BSS PLUS IO SOLN
INTRAOCULAR | Status: AC
  Filled 2012-05-10: qty 500

## 2012-05-10 MED ORDER — MORPHINE SULFATE 2 MG/ML IJ SOLN: 1.0000 mg | INTRAMUSCULAR | Status: DC | PRN

## 2012-05-10 MED ORDER — INSULIN ASPART 100 UNIT/ML ~~LOC~~ SOLN
5.0000 [IU] | Freq: Once | SUBCUTANEOUS | Status: AC
  Administered 2012-05-10: 5 [IU] via SUBCUTANEOUS

## 2012-05-10 MED ORDER — OXYCODONE HCL 5 MG/5ML PO SOLN: 5.0000 mg | Freq: Once | ORAL | Status: DC | PRN

## 2012-05-10 MED ORDER — PROVISC 10 MG/ML IO SOLN
INTRAOCULAR | Status: DC | PRN
  Administered 2012-05-10: .85 mL via INTRAOCULAR

## 2012-05-10 MED ORDER — INSULIN ASPART 100 UNIT/ML ~~LOC~~ SOLN
8.0000 [IU] | Freq: Once | SUBCUTANEOUS | Status: AC
  Administered 2012-05-10: 8 [IU] via SUBCUTANEOUS

## 2012-05-10 MED ORDER — INSULIN ASPART 100 UNIT/ML ~~LOC~~ SOLN
0.0000 [IU] | SUBCUTANEOUS | Status: DC
  Administered 2012-05-10: 15 [IU] via SUBCUTANEOUS
  Administered 2012-05-10: 5 [IU] via SUBCUTANEOUS
  Administered 2012-05-11: 11 [IU] via SUBCUTANEOUS
  Administered 2012-05-11: 15 [IU] via SUBCUTANEOUS
  Administered 2012-05-11: 5 [IU] via SUBCUTANEOUS

## 2012-05-10 MED ORDER — SODIUM CHLORIDE 0.45 % IV SOLN
INTRAVENOUS | Status: DC
  Administered 2012-05-10: 20 mL/h via INTRAVENOUS

## 2012-05-10 MED ORDER — BACITRACIN-POLYMYXIN B 500-10000 UNIT/GM OP OINT
1.0000 "application " | TOPICAL_OINTMENT | Freq: Four times a day (QID) | OPHTHALMIC | Status: DC
  Filled 2012-05-10: qty 3.5

## 2012-05-10 MED ORDER — MINERAL OIL LIGHT 100 % EX OIL
TOPICAL_OIL | CUTANEOUS | Status: AC
  Filled 2012-05-10: qty 25

## 2012-05-10 MED ORDER — DOCUSATE SODIUM 100 MG PO CAPS
100.0000 mg | ORAL_CAPSULE | Freq: Two times a day (BID) | ORAL | Status: DC
  Administered 2012-05-10 (×2): 100 mg via ORAL
  Filled 2012-05-10 (×2): qty 1

## 2012-05-10 MED ORDER — HYDROMORPHONE HCL PF 1 MG/ML IJ SOLN
0.2500 mg | INTRAMUSCULAR | Status: DC | PRN
Start: 2012-05-10 — End: 2012-05-10
  Administered 2012-05-10 (×2): 0.25 mg via INTRAVENOUS

## 2012-05-10 MED ORDER — BACITRACIN-POLYMYXIN B 500-10000 UNIT/GM OP OINT
TOPICAL_OINTMENT | OPHTHALMIC | Status: DC | PRN
  Administered 2012-05-10: 1 via OPHTHALMIC

## 2012-05-10 MED ORDER — GATIFLOXACIN 0.5 % OP SOLN
1.0000 [drp] | OPHTHALMIC | Status: AC | PRN
  Administered 2012-05-10 (×3): 1 [drp] via OPHTHALMIC
  Filled 2012-05-10: qty 2.5

## 2012-05-10 MED ORDER — LATANOPROST 0.005 % OP SOLN
1.0000 [drp] | Freq: Every day | OPHTHALMIC | Status: DC
  Filled 2012-05-10: qty 2.5

## 2012-05-10 MED ORDER — HYDROCODONE-ACETAMINOPHEN 5-325 MG PO TABS
1.0000 | ORAL_TABLET | ORAL | Status: DC | PRN
  Administered 2012-05-10: 1 via ORAL
  Administered 2012-05-11: 2 via ORAL
  Filled 2012-05-10: qty 2
  Filled 2012-05-10: qty 1

## 2012-05-10 MED ORDER — ROCURONIUM BROMIDE 100 MG/10ML IV SOLN
INTRAVENOUS | Status: DC | PRN
  Administered 2012-05-10: 10 mg via INTRAVENOUS
  Administered 2012-05-10: 30 mg via INTRAVENOUS

## 2012-05-10 MED ORDER — TROPICAMIDE 1 % OP SOLN
1.0000 [drp] | OPHTHALMIC | Status: AC | PRN
  Administered 2012-05-10 (×3): 1 [drp] via OPHTHALMIC
  Filled 2012-05-10: qty 3

## 2012-05-10 MED ORDER — SUCCINYLCHOLINE CHLORIDE 20 MG/ML IJ SOLN
INTRAMUSCULAR | Status: DC | PRN
  Administered 2012-05-10: 100 mg via INTRAVENOUS

## 2012-05-10 MED ORDER — NEOSTIGMINE METHYLSULFATE 1 MG/ML IJ SOLN
INTRAMUSCULAR | Status: DC | PRN
  Administered 2012-05-10: 5 mg via INTRAVENOUS

## 2012-05-10 MED ORDER — GLYCOPYRROLATE 0.2 MG/ML IJ SOLN
INTRAMUSCULAR | Status: DC | PRN
  Administered 2012-05-10: .8 mg via INTRAVENOUS

## 2012-05-10 MED ORDER — DEXAMETHASONE SODIUM PHOSPHATE 10 MG/ML IJ SOLN
INTRAMUSCULAR | Status: DC | PRN
  Administered 2012-05-10: 10 mg

## 2012-05-10 MED ORDER — STERILE WATER FOR IRRIGATION IR SOLN
Status: DC | PRN
  Administered 2012-05-10: 1

## 2012-05-10 MED ORDER — FUROSEMIDE 80 MG PO TABS
80.0000 mg | ORAL_TABLET | Freq: Two times a day (BID) | ORAL | Status: DC
  Administered 2012-05-10: 80 mg via ORAL
  Filled 2012-05-10 (×4): qty 1

## 2012-05-10 MED ORDER — POTASSIUM CHLORIDE CRYS ER 10 MEQ PO TBCR
10.0000 meq | EXTENDED_RELEASE_TABLET | Freq: Two times a day (BID) | ORAL | Status: DC
  Administered 2012-05-10: 10 meq via ORAL
  Filled 2012-05-10 (×3): qty 1

## 2012-05-10 MED ORDER — EPINEPHRINE HCL 1 MG/ML IJ SOLN
INTRAMUSCULAR | Status: DC | PRN
  Administered 2012-05-10: .3 mg

## 2012-05-10 MED ORDER — CYCLOPENTOLATE HCL 1 % OP SOLN
1.0000 [drp] | OPHTHALMIC | Status: AC | PRN
  Administered 2012-05-10 (×3): 1 [drp] via OPHTHALMIC
  Filled 2012-05-10: qty 2

## 2012-05-10 MED ORDER — ACETAMINOPHEN 325 MG PO TABS: 325.0000 mg | ORAL_TABLET | ORAL | Status: DC | PRN

## 2012-05-10 MED ORDER — HEMOSTATIC AGENTS (NO CHARGE) OPTIME
TOPICAL | Status: DC | PRN
  Administered 2012-05-10: 1 via TOPICAL

## 2012-05-10 MED ORDER — HYDROMORPHONE HCL PF 1 MG/ML IJ SOLN
INTRAMUSCULAR | Status: AC
  Administered 2012-05-10: 0.25 mg via INTRAVENOUS
  Filled 2012-05-10: qty 1

## 2012-05-10 MED ORDER — INSULIN ASPART 100 UNIT/ML ~~LOC~~ SOLN
SUBCUTANEOUS | Status: AC
  Filled 2012-05-10: qty 1

## 2012-05-10 MED ORDER — BSS IO SOLN
INTRAOCULAR | Status: AC
  Filled 2012-05-10: qty 15

## 2012-05-10 MED ORDER — TEMAZEPAM 15 MG PO CAPS: 15.0000 mg | ORAL_CAPSULE | Freq: Every evening | ORAL | Status: DC | PRN

## 2012-05-10 MED ORDER — ASPIRIN EC 81 MG PO TBEC
81.0000 mg | DELAYED_RELEASE_TABLET | Freq: Every day | ORAL | Status: DC
  Filled 2012-05-10 (×2): qty 1

## 2012-05-10 MED ORDER — INSULIN GLARGINE 100 UNIT/ML ~~LOC~~ SOLN: 5.0000 [IU] | Freq: Every day | SUBCUTANEOUS | Status: DC

## 2012-05-10 MED ORDER — AMLODIPINE BESYLATE 10 MG PO TABS
10.0000 mg | ORAL_TABLET | Freq: Every day | ORAL | Status: DC
  Filled 2012-05-10: qty 1

## 2012-05-10 MED ORDER — FENTANYL CITRATE 0.05 MG/ML IJ SOLN
INTRAMUSCULAR | Status: DC | PRN
  Administered 2012-05-10 (×2): 75 ug via INTRAVENOUS

## 2012-05-10 MED ORDER — PROPOFOL 10 MG/ML IV BOLUS
INTRAVENOUS | Status: DC | PRN
  Administered 2012-05-10: 20 mg via INTRAVENOUS
  Administered 2012-05-10: 200 mg via INTRAVENOUS

## 2012-05-10 MED ORDER — INSULIN ASPART 100 UNIT/ML ~~LOC~~ SOLN
10.0000 [IU] | SUBCUTANEOUS | Status: DC
  Administered 2012-05-10: 10 [IU] via SUBCUTANEOUS

## 2012-05-10 MED ORDER — HYPROMELLOSE (GONIOSCOPIC) 2.5 % OP SOLN
OPHTHALMIC | Status: AC
  Filled 2012-05-10: qty 15

## 2012-05-10 MED ORDER — LIDOCAINE HCL 2 % IJ SOLN
INTRAMUSCULAR | Status: AC
  Filled 2012-05-10: qty 20

## 2012-05-10 MED ORDER — PREDNISOLONE ACETATE 1 % OP SUSP
1.0000 [drp] | Freq: Four times a day (QID) | OPHTHALMIC | Status: DC
  Filled 2012-05-10: qty 1

## 2012-05-10 MED ORDER — SODIUM CHLORIDE 0.9 % IR SOLN
Status: DC | PRN
  Administered 2012-05-10: 1

## 2012-05-10 MED ORDER — HYALURONIDASE HUMAN 150 UNIT/ML IJ SOLN
INTRAMUSCULAR | Status: AC
  Filled 2012-05-10: qty 1

## 2012-05-10 MED ORDER — SODIUM CHLORIDE 0.9 % IV SOLN
INTRAVENOUS | Status: DC | PRN
  Administered 2012-05-10: 11:00:00 via INTRAVENOUS

## 2012-05-10 MED ORDER — POLYSACCHARIDE IRON COMPLEX 150 MG PO CAPS
150.0000 mg | ORAL_CAPSULE | Freq: Every day | ORAL | Status: DC
  Filled 2012-05-10 (×2): qty 1

## 2012-05-10 MED ORDER — ONDANSETRON HCL 4 MG/2ML IJ SOLN
4.0000 mg | Freq: Four times a day (QID) | INTRAMUSCULAR | Status: DC | PRN
  Administered 2012-05-10: 4 mg via INTRAVENOUS

## 2012-05-10 MED ORDER — EPINEPHRINE HCL 1 MG/ML IJ SOLN
INTRAMUSCULAR | Status: AC
  Filled 2012-05-10: qty 1

## 2012-05-10 MED ORDER — PROMETHAZINE HCL 25 MG/ML IJ SOLN: 6.2500 mg | INTRAMUSCULAR | Status: DC | PRN

## 2012-05-10 MED ORDER — TETRACAINE HCL 0.5 % OP SOLN
2.0000 [drp] | Freq: Once | OPHTHALMIC | Status: DC
  Filled 2012-05-10: qty 2

## 2012-05-10 MED ORDER — MUPIROCIN 2 % EX OINT
TOPICAL_OINTMENT | CUTANEOUS | Status: AC
  Filled 2012-05-10: qty 22

## 2012-05-10 MED ORDER — LIDOCAINE HCL (CARDIAC) 20 MG/ML IV SOLN
INTRAVENOUS | Status: DC | PRN
  Administered 2012-05-10: 100 mg via INTRAVENOUS

## 2012-05-10 MED ORDER — MUPIROCIN 2 % EX OINT
TOPICAL_OINTMENT | Freq: Two times a day (BID) | CUTANEOUS | Status: DC
  Filled 2012-05-10: qty 22

## 2012-05-10 MED ORDER — INSULIN LISPRO 100 UNIT/ML ~~LOC~~ SOLN: 10.0000 [IU] | Freq: Two times a day (BID) | SUBCUTANEOUS | Status: DC

## 2012-05-10 MED ORDER — DEXAMETHASONE SODIUM PHOSPHATE 10 MG/ML IJ SOLN
INTRAMUSCULAR | Status: AC
  Filled 2012-05-10: qty 1

## 2012-05-10 MED ORDER — POLYMYXIN B SULFATE 500000 UNITS IJ SOLR
INTRAMUSCULAR | Status: AC
  Filled 2012-05-10: qty 1

## 2012-05-10 MED ORDER — SODIUM HYALURONATE 10 MG/ML IO SOLN
INTRAOCULAR | Status: AC
  Filled 2012-05-10: qty 0.85

## 2012-05-10 MED ORDER — ATROPINE SULFATE 1 % OP SOLN
OPHTHALMIC | Status: DC | PRN
  Administered 2012-05-10: 1 [drp] via OPHTHALMIC

## 2012-05-10 MED ORDER — CARVEDILOL 25 MG PO TABS
25.0000 mg | ORAL_TABLET | Freq: Two times a day (BID) | ORAL | Status: DC
  Administered 2012-05-10: 25 mg via ORAL
  Filled 2012-05-10 (×4): qty 1

## 2012-05-10 MED ORDER — BSS IO SOLN
INTRAOCULAR | Status: DC | PRN
  Administered 2012-05-10: 500 mL via INTRAOCULAR

## 2012-05-10 MED ORDER — ONDANSETRON HCL 4 MG/2ML IJ SOLN
INTRAMUSCULAR | Status: DC | PRN
  Administered 2012-05-10: 4 mg via INTRAVENOUS

## 2012-05-10 MED ORDER — OXYCODONE HCL 5 MG PO TABS
5.0000 mg | ORAL_TABLET | Freq: Once | ORAL | Status: DC | PRN
Start: 2012-05-10 — End: 2012-05-10

## 2012-05-10 SURGICAL SUPPLY — 74 items
APL SRG 3 HI ABS STRL LF PLS (MISCELLANEOUS)
APPLICATOR DR MATTHEWS STRL (MISCELLANEOUS) IMPLANT
BALL CTTN LRG ABS STRL LF (GAUZE/BANDAGES/DRESSINGS) ×3
BLADE EYE CATARACT 19 1.4 BEAV (BLADE) IMPLANT
BLADE MVR KNIFE 19G (BLADE) IMPLANT
BLADE MVR KNIFE 20G (BLADE) IMPLANT
CANNULA DUAL BORE 23G (CANNULA) IMPLANT
CANNULA FLEX TIP 25G (CANNULA) ×1 IMPLANT
CLOTH BEACON ORANGE TIMEOUT ST (SAFETY) ×2 IMPLANT
CORDS BIPOLAR (ELECTRODE) IMPLANT
COTTONBALL LRG STERILE PKG (GAUZE/BANDAGES/DRESSINGS) ×6 IMPLANT
DRAPE INCISE 51X51 W/FILM STRL (DRAPES) ×1 IMPLANT
DRAPE OPHTHALMIC 77X100 STRL (CUSTOM PROCEDURE TRAY) ×2 IMPLANT
FILTER BLUE MILLIPORE (MISCELLANEOUS) IMPLANT
FILTER STRAW FLUID ASPIR (MISCELLANEOUS) IMPLANT
FORCEPS ECKARDT ILM 25G SERR (OPHTHALMIC RELATED) IMPLANT
GLOVE SS BIOGEL STRL SZ 6 (GLOVE) IMPLANT
GLOVE SS BIOGEL STRL SZ 6.5 (GLOVE) ×1 IMPLANT
GLOVE SS BIOGEL STRL SZ 7 (GLOVE) ×1 IMPLANT
GLOVE SUPERSENSE BIOGEL SZ 6 (GLOVE) ×1
GLOVE SUPERSENSE BIOGEL SZ 6.5 (GLOVE) ×1
GLOVE SUPERSENSE BIOGEL SZ 7 (GLOVE) ×1
GLOVE SURG 8.5 LATEX PF (GLOVE) ×2 IMPLANT
GLOVE SURG SS PI 6.5 STRL IVOR (GLOVE) ×2 IMPLANT
GOWN STRL NON-REIN LRG LVL3 (GOWN DISPOSABLE) ×8 IMPLANT
ILLUMINATOR CHOW PICK 25GA (MISCELLANEOUS) ×2 IMPLANT
KIT BASIN OR (CUSTOM PROCEDURE TRAY) ×2 IMPLANT
KIT ROOM TURNOVER OR (KITS) ×1 IMPLANT
KNIFE CRESCENT 2.5 55 ANG (BLADE) IMPLANT
LENS BIOM SUPER VIEW SET DISP (OPHTHALMIC RELATED) IMPLANT
MARKER SKIN DUAL TIP RULER LAB (MISCELLANEOUS) IMPLANT
MASK EYE SHIELD (GAUZE/BANDAGES/DRESSINGS) ×1 IMPLANT
MICROPICK 25G (MISCELLANEOUS)
NDL 18GX1X1/2 (RX/OR ONLY) (NEEDLE) ×1 IMPLANT
NDL 25GX 5/8IN NON SAFETY (NEEDLE) ×1 IMPLANT
NDL FILTER BLUNT 18X1 1/2 (NEEDLE) ×1 IMPLANT
NDL HYPO 30X.5 LL (NEEDLE) ×1 IMPLANT
NEEDLE 18GX1X1/2 (RX/OR ONLY) (NEEDLE) ×2 IMPLANT
NEEDLE 25GX 5/8IN NON SAFETY (NEEDLE) ×2 IMPLANT
NEEDLE 27GAX1X1/2 (NEEDLE) IMPLANT
NEEDLE FILTER BLUNT 18X 1/2SAF (NEEDLE) ×1
NEEDLE FILTER BLUNT 18X1 1/2 (NEEDLE) ×1 IMPLANT
NEEDLE HYPO 30X.5 LL (NEEDLE) ×4 IMPLANT
NS IRRIG 1000ML POUR BTL (IV SOLUTION) ×2 IMPLANT
PACK VITRECTOMY CUSTOM (CUSTOM PROCEDURE TRAY) ×2 IMPLANT
PAD ARMBOARD 7.5X6 YLW CONV (MISCELLANEOUS) ×4 IMPLANT
PAD EYE OVAL STERILE LF (GAUZE/BANDAGES/DRESSINGS) ×1 IMPLANT
PAK VITRECTOMY PIK 25 GA (OPHTHALMIC RELATED) ×2 IMPLANT
PENCIL BIPOLAR 25GA STR DISP (OPHTHALMIC RELATED) IMPLANT
PICK MICROPICK 25G (MISCELLANEOUS) IMPLANT
PROBE DIRECTIONAL LASER (MISCELLANEOUS) ×1 IMPLANT
REPL STRA BRUSH NDL (NEEDLE) IMPLANT
REPL STRA BRUSH NEEDLE (NEEDLE) IMPLANT
RESERVOIR BACK FLUSH (MISCELLANEOUS) IMPLANT
ROLLS DENTAL (MISCELLANEOUS) ×4 IMPLANT
SCRAPER DIAMOND DUST MEMBRANE (MISCELLANEOUS) IMPLANT
SPONGE SURGIFOAM ABS GEL 12-7 (HEMOSTASIS) ×2 IMPLANT
STOPCOCK 4 WAY LG BORE MALE ST (IV SETS) IMPLANT
SUT CHROMIC 7 0 TG140 8 (SUTURE) IMPLANT
SUT ETHILON 10 0 CS140 6 (SUTURE) IMPLANT
SUT ETHILON 9 0 TG140 8 (SUTURE) IMPLANT
SUT POLY NON ABSORB 10-0 8 STR (SUTURE) IMPLANT
SUT SILK 4 0 RB 1 (SUTURE) IMPLANT
SYR 20CC LL (SYRINGE) ×2 IMPLANT
SYR 5ML LL (SYRINGE) IMPLANT
SYR BULB 3OZ (MISCELLANEOUS) ×2 IMPLANT
SYR TB 1ML LUER SLIP (SYRINGE) ×2 IMPLANT
SYRINGE 10CC LL (SYRINGE) IMPLANT
TAPE SURG TRANSPORE 1 IN (GAUZE/BANDAGES/DRESSINGS) IMPLANT
TAPE SURGICAL TRANSPORE 1 IN (GAUZE/BANDAGES/DRESSINGS) ×1
TOWEL OR 17X24 6PK STRL BLUE (TOWEL DISPOSABLE) ×5 IMPLANT
TROCAR CANNULA 25GA (CANNULA) IMPLANT
WATER STERILE IRR 1000ML POUR (IV SOLUTION) ×2 IMPLANT
WIPE INSTRUMENT VISIWIPE 73X73 (MISCELLANEOUS) ×2 IMPLANT

## 2012-05-10 NOTE — Brief Op Note (Signed)
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  Assistant:    Clancy Gourd RN  Anesthesia: General  Specimen: none  Estimated blood loss:  1cc  Complications: none  Patient sent to PACU in good condition  Composed by Hayden Pedro MD  Dictation number: [id number]

## 2012-05-10 NOTE — Transfer of Care (Signed)
Immediate Anesthesia Transfer of Care Note  Patient: SAFAREE BERTE  Procedure(s) Performed: Procedure(s) (LRB) with comments: PARS PLANA VITRECTOMY WITH 25 GAUGE (Right) - Repair Complex Traction Retinal Detachment MEMBRANE PEEL (Right) PHOTOCOAGULATION WITH LASER (Right) INSERTION OF GAS (Right)  Patient Location: PACU  Anesthesia Type:General  Level of Consciousness: awake, oriented, sedated and patient cooperative  Airway & Oxygen Therapy: Patient Spontanous Breathing and Patient connected to face mask oxygen  Post-op Assessment: Report given to PACU RN, Post -op Vital signs reviewed and stable and Patient moving all extremities  Post vital signs: Reviewed and stable  Complications: No apparent anesthesia complications

## 2012-05-10 NOTE — Anesthesia Postprocedure Evaluation (Signed)
  Anesthesia Post-op Note  Patient: [name]  Procedure(s) Performed: Procedure(s) (LRB) with comments: PARS PLANA VITRECTOMY WITH 25 GAUGE (Right) - Repair Complex Traction Retinal Detachment MEMBRANE PEEL (Right) PHOTOCOAGULATION WITH LASER (Right) INSERTION OF GAS (Right)  Patient Location: PACU  Anesthesia Type:General  Level of Consciousness: awake  Airway and Oxygen Therapy: Patient Spontanous Breathing  Post-op Pain: mild  Post-op Assessment: Post-op Vital signs reviewed, Patient's Cardiovascular Status Stable and Respiratory Function Stable  Post-op Vital Signs: stable  Complications: No apparent anesthesia complications

## 2012-05-10 NOTE — Preoperative (Signed)
Beta Blockers   Reason not to administer Beta Blockers:Pt took carvedilol today.

## 2012-05-10 NOTE — H&P (Signed)
I examined the patient today and there is no change in the medical status 

## 2012-05-10 NOTE — Anesthesia Preprocedure Evaluation (Addendum)
Anesthesia Evaluation  Patient identified by MRN, date of birth, ID band Patient awake    Reviewed: Allergy & Precautions, H&P , NPO status   History of Anesthesia Complications (+) Family history of anesthesia reaction  Airway Mallampati: II  Neck ROM: Full    Dental   Pulmonary shortness of breath, sleep apnea ,  breath sounds clear to auscultation        Cardiovascular hypertension, + Peripheral Vascular Disease + dysrhythmias Rhythm:Regular Rate:Normal     Neuro/Psych  Headaches,    GI/Hepatic   Endo/Other  diabetes, Poorly Controlled, Insulin Dependent  Renal/GU Renal InsufficiencyRenal disease     Musculoskeletal   Abdominal   Peds  Hematology  (+) Blood dyscrasia, anemia ,   Anesthesia Other Findings   Reproductive/Obstetrics                          Anesthesia Physical Anesthesia Plan  ASA: III  Anesthesia Plan: General   Post-op Pain Management:    Induction: Intravenous  Airway Management Planned: Oral ETT  Additional Equipment:   Intra-op Plan:   Post-operative Plan: Extubation in OR  Informed Consent:   Dental advisory given  Plan Discussed with: CRNA and Surgeon  Anesthesia Plan Comments:         Anesthesia Quick Evaluation

## 2012-05-10 NOTE — Progress Notes (Signed)
NOTIFIED DR. MASSAGEE , OF GLUCOSE 499. RECEIVED ORDER TO GIVE NOVOLOG INSULIN 8 MG SQ(WHICH WAS GIVEN ).   RECHECKED BLOOD SUGAR 1040, WHICH WAS 484. REPORTED RESULT TO DR. MASSAGEE. NO FURTHER ORDERS.

## 2012-05-10 NOTE — Anesthesia Procedure Notes (Signed)
Procedure Name: Intubation Date/Time: 05/10/2012 11:37 AM Performed by: Ned Grace Pre-anesthesia Checklist: Patient identified, Timeout performed, Emergency Drugs available, Suction available and Patient being monitored Patient Re-evaluated:Patient Re-evaluated prior to inductionOxygen Delivery Method: Circle system utilized Preoxygenation: Pre-oxygenation with 100% oxygen Intubation Type: IV induction and Rapid sequence Laryngoscope Size: Mac and 4 Grade View: Grade I Tube type: Oral Tube size: 7.5 mm Number of attempts: 1 Airway Equipment and Method: Stylet Placement Confirmation: ETT inserted through vocal cords under direct vision,  breath sounds checked- equal and bilateral and positive ETCO2 Secured at: 22 cm Tube secured with: Tape Dental Injury: Teeth and Oropharynx as per pre-operative assessment

## 2012-05-11 ENCOUNTER — Encounter (HOSPITAL_COMMUNITY): Payer: Self-pay | Admitting: Ophthalmology

## 2012-05-11 LAB — GLUCOSE, RANDOM: Glucose, Bld: 397 mg/dL — ABNORMAL HIGH (ref 70–99)

## 2012-05-11 LAB — GLUCOSE, CAPILLARY
Glucose-Capillary: 329 mg/dL — ABNORMAL HIGH (ref 70–99)
Glucose-Capillary: 236 mg/dL — ABNORMAL HIGH (ref 70–99)

## 2012-05-11 MED ORDER — BACITRACIN-POLYMYXIN B 500-10000 UNIT/GM OP OINT: 1.0000 "application " | TOPICAL_OINTMENT | Freq: Four times a day (QID) | OPHTHALMIC | Status: DC

## 2012-05-11 MED ORDER — PREDNISOLONE ACETATE 1 % OP SUSP: 1.0000 [drp] | Freq: Four times a day (QID) | OPHTHALMIC | Status: DC

## 2012-05-11 MED ORDER — BRIMONIDINE TARTRATE 0.2 % OP SOLN: 1.0000 [drp] | Freq: Two times a day (BID) | OPHTHALMIC | Status: DC

## 2012-05-11 NOTE — Progress Notes (Signed)
05/11/2012, 6:48 AM  Mental Status:  Awake, Alert, Oriented  Anterior segment: Cornea  Clear    Anterior Chamber Clear    Lens:   Cataract  Intra Ocular Pressure 19 mmHg with Tonopen  Vitreous: Clear 90%gas bubble Hemorrhage    Retina:  Attached Good laser reaction   Impression: Excellent result Retina attached   Final Diagnosis: Principal Problem:  *Traction retinal detachment Active Problems:  Vitreous hemorrhage   Plan: start post operative eye drops.  Discharge to home.  Give post operative instructions  [name] 05/11/2012, 6:48 AM

## 2012-05-11 NOTE — Progress Notes (Signed)
DC HOME WITH FAMILY, PT VERBALLY UNDERSTOOD DC INSTRUCTIONS HANDOUT GIVEN TO BY NURSE AND DR. MATTHEWS. NO QUESTIONS ASKED

## 2012-05-11 NOTE — Discharge Summary (Signed)
Discharge summary not needed on [facility] patients per medical records. 

## 2012-05-11 NOTE — Op Note (Signed)
NAMETOD, SCHARR NO.:  [id number]  MEDICAL RECORD NO.:  [id number]  LOCATION:  6N05C                        FACILITY:  [facility]  PHYSICIAN:  Chrystie Nose. Zigmund Daniel, [name] DATE OF BIRTH:  1951-04-25  DATE OF PROCEDURE:  05/10/2012 DATE OF DISCHARGE:                              OPERATIVE REPORT   ADMISSION DIAGNOSES:  Complex traction retinal detachment, right eye. Proliferative diabetic retinopathy, right eye.  Vitreous hemorrhage, right eye.  PROCEDURES:  Repair of complex traction retinal detachment with pars plana vitrectomy, membrane peel, retinal photocoagulation, gas-fluid exchange in the right eye.  SURGEON:  Chrystie Nose. Zigmund Daniel, [name]  ASSISTANT:  Clancy Gourd, RN.  ANESTHESIA:  General.  DETAILS:  Usual prep and drape, 25-gauge trocar was placed at 8, 10 and 2 o'clock, infusion at 8 o'clock.  Provisc was placed on the corneal surface and the BIOM viewing system was moved into place.  Pars plana vitrectomy was begun just behind the cataractous lens.  Cortical and nuclear cataract was present obscuring the view.  The vitrectomy was carried into the midvitreous where dense cranberry red blood was encountered.  This was carefully removed under low suction and rapid cutting in a core fashion.  The vitrectomy was carried down to the macular surface where traction detachment was seen above and below the disk.  These areas of traction detachment were trimmed down to the retinal surface and peeled from the optic disc.  The vitrectomy was carried to the midperiphery where pockets of subhyaloid blood were encountered in the midperipheral area.  These were unroofed and the hyaloid was lifted.  The blood was removed from beneath these areas of hyaloid entrapment.  A large clot of blood was seen in the upper nasal quadrant.  This was removed slowly and gradually layer by layer with the vitreous cutter and the lighted pick.  The wide field BIOM viewing system was moved  into place and additional blood was removed down to the vitreous base for 360 degrees.  The blood was vacuumed from these areas. Once all the blood and traction was removed, the endolaser was positioned in the eye.  845 burns were placed around the retinal periphery.  The power was 1500 mW, 1000 microns each and 0.1 seconds each.  A total gas-fluid exchange was then carried out for reattachment of the detached areas.  Once this was accomplished, the instruments were removed from the eye.  The 25-gauge trocars were removed and the conjunctiva was allowed to slide over the scleral wound.  The wounds were tested and found to be secure.  Polymyxin and gentamicin were irrigated into tenon space.  Atropine solution was applied.  Marcaine was injected around the globe for postop pain.  Decadron 10 mg was injected into the lower subconjunctival space.  Closing pressure was 10 with a Barraquer tonometer.  COMPLICATIONS:  None.  DURATION:  1 hour.  The patient was awakened and taken to the recovery in satisfactory condition.     Chrystie Nose. Zigmund Daniel, [name]     JDM/MEDQ  D:  05/10/2012  T:  05/11/2012  Job:  [id number]

## 2012-05-13 ENCOUNTER — Encounter (HOSPITAL_COMMUNITY)
Admission: RE | Admit: 2012-05-13 | Discharge: 2012-05-13 | Disposition: A | Discharge status: Home / Self Care | Payer: Medicare Other | Source: Ambulatory Visit | Attending: Nephrology | Admitting: Nephrology

## 2012-05-13 DIAGNOSIS — N184 Chronic kidney disease, stage 4 (severe): Secondary | ICD-10-CM | POA: Diagnosis not present

## 2012-05-13 DIAGNOSIS — D638 Anemia in other chronic diseases classified elsewhere: Secondary | ICD-10-CM | POA: Diagnosis not present

## 2012-05-13 LAB — IRON AND TIBC
Iron: 70 ug/dL (ref 42–135)
Saturation Ratios: 22 % (ref 20–55)
TIBC: 320 ug/dL (ref 215–435)
UIBC: 250 ug/dL (ref 125–400)

## 2012-05-13 LAB — FERRITIN: Ferritin: 159 ng/mL (ref 22–322)

## 2012-05-13 MED ORDER — EPOETIN ALFA 20000 UNIT/ML IJ SOLN
20000.0000 [IU] | INTRAMUSCULAR | Status: DC
  Administered 2012-05-13: 20000 [IU] via SUBCUTANEOUS

## 2012-05-13 MED ORDER — EPOETIN ALFA 20000 UNIT/ML IJ SOLN
INTRAMUSCULAR | Status: AC
  Administered 2012-05-13: 20000 [IU] via SUBCUTANEOUS
  Filled 2012-05-13: qty 1

## 2012-05-16 LAB — POCT HEMOGLOBIN-HEMACUE: Hemoglobin: 10.7 g/dL — ABNORMAL LOW (ref 13.0–17.0)

## 2012-05-17 ENCOUNTER — Inpatient Hospital Stay (INDEPENDENT_AMBULATORY_CARE_PROVIDER_SITE_OTHER): Payer: Medicare Other | Admitting: Ophthalmology

## 2012-05-17 DIAGNOSIS — E1139 Type 2 diabetes mellitus with other diabetic ophthalmic complication: Secondary | ICD-10-CM

## 2012-05-17 DIAGNOSIS — E11359 Type 2 diabetes mellitus with proliferative diabetic retinopathy without macular edema: Secondary | ICD-10-CM

## 2012-05-17 DIAGNOSIS — E1165 Type 2 diabetes mellitus with hyperglycemia: Secondary | ICD-10-CM

## 2012-05-19 DIAGNOSIS — Z7901 Long term (current) use of anticoagulants: Secondary | ICD-10-CM | POA: Diagnosis not present

## 2012-05-19 DIAGNOSIS — I82409 Acute embolism and thrombosis of unspecified deep veins of unspecified lower extremity: Secondary | ICD-10-CM | POA: Diagnosis not present

## 2012-05-19 DIAGNOSIS — I10 Essential (primary) hypertension: Secondary | ICD-10-CM | POA: Diagnosis not present

## 2012-05-19 DIAGNOSIS — I4949 Other premature depolarization: Secondary | ICD-10-CM | POA: Diagnosis not present

## 2012-05-19 DIAGNOSIS — E119 Type 2 diabetes mellitus without complications: Secondary | ICD-10-CM | POA: Diagnosis not present

## 2012-05-27 ENCOUNTER — Encounter (HOSPITAL_COMMUNITY)
Admission: RE | Admit: 2012-05-27 | Discharge: 2012-05-27 | Disposition: A | Discharge status: Home / Self Care | Payer: Medicare Other | Source: Ambulatory Visit | Attending: Nephrology | Admitting: Nephrology

## 2012-05-27 DIAGNOSIS — D638 Anemia in other chronic diseases classified elsewhere: Secondary | ICD-10-CM | POA: Diagnosis not present

## 2012-05-27 DIAGNOSIS — N184 Chronic kidney disease, stage 4 (severe): Secondary | ICD-10-CM | POA: Diagnosis not present

## 2012-05-27 LAB — POCT HEMOGLOBIN-HEMACUE: Hemoglobin: 10.6 g/dL — ABNORMAL LOW (ref 13.0–17.0)

## 2012-05-27 MED ORDER — EPOETIN ALFA 20000 UNIT/ML IJ SOLN
20000.0000 [IU] | INTRAMUSCULAR | Status: DC
Start: 2012-05-27 — End: 2012-05-28
  Administered 2012-05-27: 20000 [IU] via SUBCUTANEOUS
  Filled 2012-05-27: qty 1

## 2012-06-02 DIAGNOSIS — D649 Anemia, unspecified: Secondary | ICD-10-CM | POA: Diagnosis not present

## 2012-06-02 DIAGNOSIS — N2581 Secondary hyperparathyroidism of renal origin: Secondary | ICD-10-CM | POA: Diagnosis not present

## 2012-06-02 DIAGNOSIS — N184 Chronic kidney disease, stage 4 (severe): Secondary | ICD-10-CM | POA: Diagnosis not present

## 2012-06-02 DIAGNOSIS — I10 Essential (primary) hypertension: Secondary | ICD-10-CM | POA: Diagnosis not present

## 2012-06-02 DIAGNOSIS — R609 Edema, unspecified: Secondary | ICD-10-CM | POA: Diagnosis not present

## 2012-06-02 DIAGNOSIS — I129 Hypertensive chronic kidney disease with stage 1 through stage 4 chronic kidney disease, or unspecified chronic kidney disease: Secondary | ICD-10-CM | POA: Diagnosis not present

## 2012-06-03 ENCOUNTER — Encounter (INDEPENDENT_AMBULATORY_CARE_PROVIDER_SITE_OTHER): Payer: Medicare Other | Admitting: Ophthalmology

## 2012-06-03 DIAGNOSIS — H4310 Vitreous hemorrhage, unspecified eye: Secondary | ICD-10-CM

## 2012-06-10 ENCOUNTER — Encounter (HOSPITAL_COMMUNITY)
Admission: RE | Admit: 2012-06-10 | Discharge: 2012-06-10 | Disposition: A | Discharge status: Home / Self Care | Payer: Medicare Other | Source: Ambulatory Visit | Attending: Nephrology | Admitting: Nephrology

## 2012-06-10 DIAGNOSIS — D638 Anemia in other chronic diseases classified elsewhere: Secondary | ICD-10-CM | POA: Diagnosis not present

## 2012-06-10 DIAGNOSIS — N184 Chronic kidney disease, stage 4 (severe): Secondary | ICD-10-CM | POA: Insufficient documentation

## 2012-06-10 LAB — FERRITIN: Ferritin: 157 ng/mL (ref 22–322)

## 2012-06-10 LAB — IRON AND TIBC
Iron: 64 ug/dL (ref 42–135)
Saturation Ratios: 21 % (ref 20–55)
TIBC: 309 ug/dL (ref 215–435)
UIBC: 245 ug/dL (ref 125–400)

## 2012-06-10 LAB — POCT HEMOGLOBIN-HEMACUE: Hemoglobin: 10.8 g/dL — ABNORMAL LOW (ref 13.0–17.0)

## 2012-06-10 MED ORDER — EPOETIN ALFA 20000 UNIT/ML IJ SOLN
INTRAMUSCULAR | Status: AC
  Filled 2012-06-10: qty 1

## 2012-06-10 MED ORDER — EPOETIN ALFA 20000 UNIT/ML IJ SOLN
20000.0000 [IU] | INTRAMUSCULAR | Status: DC
Start: 2012-06-10 — End: 2012-06-11
  Administered 2012-06-10: 20000 [IU] via SUBCUTANEOUS

## 2012-06-22 ENCOUNTER — Ambulatory Visit (INDEPENDENT_AMBULATORY_CARE_PROVIDER_SITE_OTHER): Payer: Medicare Other | Admitting: Ophthalmology

## 2012-06-24 ENCOUNTER — Encounter (HOSPITAL_COMMUNITY): Payer: Medicare Other

## 2012-06-29 ENCOUNTER — Encounter (HOSPITAL_COMMUNITY): Payer: Medicare Other

## 2012-07-04 ENCOUNTER — Encounter (INDEPENDENT_AMBULATORY_CARE_PROVIDER_SITE_OTHER): Payer: Medicare Other | Admitting: Ophthalmology

## 2012-07-04 DIAGNOSIS — H4310 Vitreous hemorrhage, unspecified eye: Secondary | ICD-10-CM

## 2012-07-04 DIAGNOSIS — E1139 Type 2 diabetes mellitus with other diabetic ophthalmic complication: Secondary | ICD-10-CM

## 2012-07-04 DIAGNOSIS — E11359 Type 2 diabetes mellitus with proliferative diabetic retinopathy without macular edema: Secondary | ICD-10-CM

## 2012-07-05 DIAGNOSIS — I82409 Acute embolism and thrombosis of unspecified deep veins of unspecified lower extremity: Secondary | ICD-10-CM | POA: Diagnosis not present

## 2012-07-05 DIAGNOSIS — Z7901 Long term (current) use of anticoagulants: Secondary | ICD-10-CM | POA: Diagnosis not present

## 2012-07-06 ENCOUNTER — Encounter (HOSPITAL_COMMUNITY)
Admission: RE | Admit: 2012-07-06 | Discharge: 2012-07-06 | Disposition: A | Discharge status: Home / Self Care | Payer: Medicare Other | Source: Ambulatory Visit | Attending: Nephrology | Admitting: Nephrology

## 2012-07-06 DIAGNOSIS — D638 Anemia in other chronic diseases classified elsewhere: Secondary | ICD-10-CM | POA: Insufficient documentation

## 2012-07-06 DIAGNOSIS — N184 Chronic kidney disease, stage 4 (severe): Secondary | ICD-10-CM | POA: Insufficient documentation

## 2012-07-06 LAB — FERRITIN: Ferritin: 210 ng/mL (ref 22–322)

## 2012-07-06 LAB — POCT HEMOGLOBIN-HEMACUE: Hemoglobin: 10.4 g/dL — ABNORMAL LOW (ref 13.0–17.0)

## 2012-07-06 LAB — IRON AND TIBC
Iron: 67 ug/dL (ref 42–135)
Saturation Ratios: 20 % (ref 20–55)
TIBC: 332 ug/dL (ref 215–435)
UIBC: 265 ug/dL (ref 125–400)

## 2012-07-06 MED ORDER — EPOETIN ALFA 20000 UNIT/ML IJ SOLN
20000.0000 [IU] | INTRAMUSCULAR | Status: DC
Start: 2012-07-06 — End: 2012-07-07
  Administered 2012-07-06: 20000 [IU] via SUBCUTANEOUS

## 2012-07-06 MED ORDER — EPOETIN ALFA 20000 UNIT/ML IJ SOLN
INTRAMUSCULAR | Status: AC
  Filled 2012-07-06: qty 1

## 2012-07-19 ENCOUNTER — Other Ambulatory Visit (HOSPITAL_COMMUNITY): Payer: Self-pay | Admitting: *Deleted

## 2012-07-20 ENCOUNTER — Encounter (HOSPITAL_COMMUNITY)
Admission: RE | Admit: 2012-07-20 | Discharge: 2012-07-20 | Disposition: A | Discharge status: Home / Self Care | Payer: Medicare Other | Source: Ambulatory Visit | Attending: Nephrology | Admitting: Nephrology

## 2012-07-20 DIAGNOSIS — D638 Anemia in other chronic diseases classified elsewhere: Secondary | ICD-10-CM | POA: Diagnosis not present

## 2012-07-20 DIAGNOSIS — N184 Chronic kidney disease, stage 4 (severe): Secondary | ICD-10-CM | POA: Diagnosis not present

## 2012-07-20 LAB — POCT HEMOGLOBIN-HEMACUE: Hemoglobin: 11 g/dL — ABNORMAL LOW (ref 13.0–17.0)

## 2012-07-20 MED ORDER — EPOETIN ALFA 20000 UNIT/ML IJ SOLN
20000.0000 [IU] | INTRAMUSCULAR | Status: DC
  Administered 2012-07-20: 20000 [IU] via SUBCUTANEOUS

## 2012-07-20 MED ORDER — EPOETIN ALFA 20000 UNIT/ML IJ SOLN
INTRAMUSCULAR | Status: AC
  Administered 2012-07-20: 20000 [IU] via SUBCUTANEOUS
  Filled 2012-07-20: qty 1

## 2012-07-26 DIAGNOSIS — Z7901 Long term (current) use of anticoagulants: Secondary | ICD-10-CM | POA: Diagnosis not present

## 2012-07-26 DIAGNOSIS — I82409 Acute embolism and thrombosis of unspecified deep veins of unspecified lower extremity: Secondary | ICD-10-CM | POA: Diagnosis not present

## 2012-08-01 DIAGNOSIS — N184 Chronic kidney disease, stage 4 (severe): Secondary | ICD-10-CM | POA: Diagnosis not present

## 2012-08-01 DIAGNOSIS — N2581 Secondary hyperparathyroidism of renal origin: Secondary | ICD-10-CM | POA: Diagnosis not present

## 2012-08-03 ENCOUNTER — Encounter (HOSPITAL_COMMUNITY)
Admission: RE | Admit: 2012-08-03 | Discharge: 2012-08-03 | Disposition: A | Discharge status: Home / Self Care | Payer: Medicare Other | Source: Ambulatory Visit | Attending: Nephrology | Admitting: Nephrology

## 2012-08-03 DIAGNOSIS — D638 Anemia in other chronic diseases classified elsewhere: Secondary | ICD-10-CM | POA: Insufficient documentation

## 2012-08-03 DIAGNOSIS — N184 Chronic kidney disease, stage 4 (severe): Secondary | ICD-10-CM | POA: Insufficient documentation

## 2012-08-03 LAB — IRON AND TIBC
Iron: 48 ug/dL (ref 42–135)
Saturation Ratios: 15 % — ABNORMAL LOW (ref 20–55)
TIBC: 327 ug/dL (ref 215–435)
UIBC: 279 ug/dL (ref 125–400)

## 2012-08-03 LAB — POCT HEMOGLOBIN-HEMACUE: Hemoglobin: 11.6 g/dL — ABNORMAL LOW (ref 13.0–17.0)

## 2012-08-03 LAB — FERRITIN: Ferritin: 199 ng/mL (ref 22–322)

## 2012-08-03 MED ORDER — EPOETIN ALFA 20000 UNIT/ML IJ SOLN: 20000.0000 [IU] | INTRAMUSCULAR | Status: DC

## 2012-08-16 ENCOUNTER — Other Ambulatory Visit (HOSPITAL_COMMUNITY): Payer: Self-pay | Admitting: *Deleted

## 2012-08-16 DIAGNOSIS — I82409 Acute embolism and thrombosis of unspecified deep veins of unspecified lower extremity: Secondary | ICD-10-CM | POA: Diagnosis not present

## 2012-08-16 DIAGNOSIS — M549 Dorsalgia, unspecified: Secondary | ICD-10-CM | POA: Diagnosis not present

## 2012-08-16 DIAGNOSIS — Z7901 Long term (current) use of anticoagulants: Secondary | ICD-10-CM | POA: Diagnosis not present

## 2012-08-17 ENCOUNTER — Encounter (HOSPITAL_COMMUNITY)
Admission: RE | Admit: 2012-08-17 | Discharge: 2012-08-17 | Disposition: A | Discharge status: Home / Self Care | Payer: Medicare Other | Source: Ambulatory Visit | Attending: Nephrology | Admitting: Nephrology

## 2012-08-17 LAB — POCT HEMOGLOBIN-HEMACUE: Hemoglobin: 10.3 g/dL — ABNORMAL LOW (ref 13.0–17.0)

## 2012-08-17 MED ORDER — EPOETIN ALFA 20000 UNIT/ML IJ SOLN
20000.0000 [IU] | INTRAMUSCULAR | Status: DC
  Administered 2012-08-17: 20000 [IU] via SUBCUTANEOUS

## 2012-08-17 MED ORDER — EPOETIN ALFA 20000 UNIT/ML IJ SOLN
INTRAMUSCULAR | Status: AC
  Filled 2012-08-17: qty 1

## 2012-08-17 MED ORDER — FERUMOXYTOL INJECTION 510 MG/17 ML
1020.0000 mg | Freq: Once | INTRAVENOUS | Status: AC
  Administered 2012-08-17: 1020 mg via INTRAVENOUS
  Filled 2012-08-17: qty 34

## 2012-08-22 ENCOUNTER — Emergency Department (HOSPITAL_COMMUNITY)
Admission: EM | Admit: 2012-08-22 | Discharge: 2012-08-22 | Disposition: A | Discharge status: Home / Self Care | Payer: Medicare Other | Attending: Emergency Medicine | Admitting: Emergency Medicine

## 2012-08-22 ENCOUNTER — Encounter (HOSPITAL_COMMUNITY): Payer: Self-pay | Admitting: Emergency Medicine

## 2012-08-22 DIAGNOSIS — Z91199 Patient's noncompliance with other medical treatment and regimen due to unspecified reason: Secondary | ICD-10-CM | POA: Insufficient documentation

## 2012-08-22 DIAGNOSIS — I82402 Acute embolism and thrombosis of unspecified deep veins of left lower extremity: Secondary | ICD-10-CM

## 2012-08-22 DIAGNOSIS — Z8709 Personal history of other diseases of the respiratory system: Secondary | ICD-10-CM | POA: Diagnosis not present

## 2012-08-22 DIAGNOSIS — Z7901 Long term (current) use of anticoagulants: Secondary | ICD-10-CM | POA: Diagnosis not present

## 2012-08-22 DIAGNOSIS — E1169 Type 2 diabetes mellitus with other specified complication: Secondary | ICD-10-CM | POA: Insufficient documentation

## 2012-08-22 DIAGNOSIS — Z794 Long term (current) use of insulin: Secondary | ICD-10-CM | POA: Diagnosis not present

## 2012-08-22 DIAGNOSIS — Z9119 Patient's noncompliance with other medical treatment and regimen: Secondary | ICD-10-CM | POA: Insufficient documentation

## 2012-08-22 DIAGNOSIS — N184 Chronic kidney disease, stage 4 (severe): Secondary | ICD-10-CM | POA: Insufficient documentation

## 2012-08-22 DIAGNOSIS — E119 Type 2 diabetes mellitus without complications: Secondary | ICD-10-CM | POA: Diagnosis not present

## 2012-08-22 DIAGNOSIS — Z79899 Other long term (current) drug therapy: Secondary | ICD-10-CM | POA: Diagnosis not present

## 2012-08-22 DIAGNOSIS — Z86718 Personal history of other venous thrombosis and embolism: Secondary | ICD-10-CM | POA: Insufficient documentation

## 2012-08-22 DIAGNOSIS — Z8679 Personal history of other diseases of the circulatory system: Secondary | ICD-10-CM | POA: Insufficient documentation

## 2012-08-22 DIAGNOSIS — Z859 Personal history of malignant neoplasm, unspecified: Secondary | ICD-10-CM | POA: Diagnosis not present

## 2012-08-22 DIAGNOSIS — R739 Hyperglycemia, unspecified: Secondary | ICD-10-CM

## 2012-08-22 DIAGNOSIS — I10 Essential (primary) hypertension: Secondary | ICD-10-CM | POA: Diagnosis not present

## 2012-08-22 DIAGNOSIS — E785 Hyperlipidemia, unspecified: Secondary | ICD-10-CM | POA: Insufficient documentation

## 2012-08-22 DIAGNOSIS — E11319 Type 2 diabetes mellitus with unspecified diabetic retinopathy without macular edema: Secondary | ICD-10-CM | POA: Diagnosis not present

## 2012-08-22 DIAGNOSIS — M79609 Pain in unspecified limb: Secondary | ICD-10-CM

## 2012-08-22 LAB — CBC WITH DIFFERENTIAL/PLATELET
Basophils Absolute: 0 10*3/uL (ref 0.0–0.1)
Basophils Relative: 0 % (ref 0–1)
Eosinophils Absolute: 0 10*3/uL (ref 0.0–0.7)
Eosinophils Relative: 1 % (ref 0–5)
HCT: 32 % — ABNORMAL LOW (ref 39.0–52.0)
Hemoglobin: 11.1 g/dL — ABNORMAL LOW (ref 13.0–17.0)
Lymphocytes Relative: 18 % (ref 12–46)
Lymphs Abs: 1 10*3/uL (ref 0.7–4.0)
MCH: 26.9 pg (ref 26.0–34.0)
MCHC: 34.7 g/dL (ref 30.0–36.0)
MCV: 77.7 fL — ABNORMAL LOW (ref 78.0–100.0)
Monocytes Absolute: 0.3 10*3/uL (ref 0.1–1.0)
Monocytes Relative: 5 % (ref 3–12)
Neutro Abs: 4.1 10*3/uL (ref 1.7–7.7)
Neutrophils Relative %: 75 % (ref 43–77)
Platelets: 259 10*3/uL (ref 150–400)
RBC: 4.12 MIL/uL — ABNORMAL LOW (ref 4.22–5.81)
RDW: 16.6 % — ABNORMAL HIGH (ref 11.5–15.5)
WBC: 5.4 10*3/uL (ref 4.0–10.5)

## 2012-08-22 LAB — APTT: aPTT: 41 seconds — ABNORMAL HIGH (ref 24–37)

## 2012-08-22 LAB — BASIC METABOLIC PANEL
BUN: 33 mg/dL — ABNORMAL HIGH (ref 6–23)
CO2: 28 mEq/L (ref 19–32)
Calcium: 9.7 mg/dL (ref 8.4–10.5)
Chloride: 95 mEq/L — ABNORMAL LOW (ref 96–112)
Creatinine, Ser: 2.86 mg/dL — ABNORMAL HIGH (ref 0.50–1.35)
GFR calc Af Amer: 26 mL/min — ABNORMAL LOW (ref >90)
GFR calc non Af Amer: 23 mL/min — ABNORMAL LOW (ref >90)
Glucose, Bld: 554 mg/dL (ref 70–99)
Potassium: 4.2 mEq/L (ref 3.5–5.1)
Sodium: 136 mEq/L (ref 135–145)

## 2012-08-22 LAB — PROTIME-INR
INR: 1.75 — ABNORMAL HIGH (ref 0.00–1.49)
Prothrombin Time: 19.8 seconds — ABNORMAL HIGH (ref 11.6–15.2)

## 2012-08-22 LAB — GLUCOSE, CAPILLARY
Glucose-Capillary: 478 mg/dL — ABNORMAL HIGH (ref 70–99)
Glucose-Capillary: 371 mg/dL — ABNORMAL HIGH (ref 70–99)

## 2012-08-22 MED ORDER — SODIUM CHLORIDE 0.9 % IV BOLUS (SEPSIS)
500.0000 mL | Freq: Once | INTRAVENOUS | Status: AC
  Administered 2012-08-22: 500 mL via INTRAVENOUS

## 2012-08-22 MED ORDER — INSULIN ASPART 100 UNIT/ML ~~LOC~~ SOLN
10.0000 [IU] | Freq: Once | SUBCUTANEOUS | Status: AC
  Administered 2012-08-22: 10 [IU] via INTRAVENOUS
  Filled 2012-08-22: qty 1

## 2012-08-22 MED ORDER — HYDROCODONE-ACETAMINOPHEN 5-325 MG PO TABS: 1.0000 | ORAL_TABLET | Freq: Four times a day (QID) | ORAL | Status: DC | PRN

## 2012-08-22 NOTE — ED Notes (Signed)
Pt transported to [facility]

## 2012-08-22 NOTE — ED Provider Notes (Signed)
History     CSN: [id number]  Arrival date & time 08/22/12  1031   First MD Initiated Contact with Patient 08/22/12 1120      No chief complaint on file.   (Consider location/radiation/quality/duration/timing/severity/associated sxs/prior treatment) HPI Comments: Patient presents with a chief complaint of LLE pain.  He reports that the pain pain is worse posterior to the knee.  Pain has been present for the past 2 days and is worsening.  No acute trauma.  He has a history of DVT.  He reports that his symptoms appear similar to when he was diagnosed with a DVT in the past.  He is currently on Coumadin.  He takes 7.5 mg daily.  He reports that he has been taking his medication as directed.  He denies any swelling or redness of the leg.  No numbness or tingling.  He denies chest pain or SOB.  He denies surgery or prolonged travel in the past 4 weeks.    The history is provided by the patient.    Past Medical History  Diagnosis Date  . Cancer   . Diabetes mellitus   . DVT (deep venous thrombosis)     Right leg  . Diabetic retinopathy(362.0)   . Hypertension   . Hyperlipidemia   . Non Hodgkin's lymphoma     Tx 2009  . History of cardiac catheterization     pt denies this on 03/15/2012  . Nodular lymphoma of intra-abdominal lymph nodes   . Shortness of breath   . Sleep apnea     "suppose to have a sleep studfy, but they never told me when.  . Peripheral vascular disease   . Blood transfusion   . 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  Marland Kitchen Headache   . Family history of anesthesia complication     " my son cant have it because he cant wake up "  . Noncompliance 03/16/2012  . CKD (chronic kidney disease) stage 4, GFR 15-29 ml/min 03/18/2012  . NSVT (nonsustained ventricular tachycardia) 03/18/2012    Past Surgical History  Procedure Laterality Date  . Porta catheter    . Porta catheter insertion    . Porta catheter removed    . Coloscopy    . Pars plana  vitrectomy  08/27/2011    Procedure: PARS PLANA VITRECTOMY WITH 25 GAUGE;  Surgeon: Hayden Pedro, MD;  Location: [facility];  Service: Ophthalmology;  Laterality: Left;  Repair of complex traction retinal detachment left eye  . Cardiac catheterization      pt denies this on 03/15/2012  . Eye surgery    . Viterectomy  05/10/2012    od  . Pars plana vitrectomy  05/10/2012    Procedure: PARS PLANA VITRECTOMY WITH 25 GAUGE;  Surgeon: Hayden Pedro, MD;  Location: [facility];  Service: Ophthalmology;  Laterality: Right;  Repair Complex Traction Retinal Detachment  . Membrane peel  05/10/2012    Procedure: MEMBRANE PEEL;  Surgeon: Hayden Pedro, MD;  Location: [facility];  Service: Ophthalmology;  Laterality: Right;  . Photocoagulation with laser  05/10/2012    Procedure: PHOTOCOAGULATION WITH LASER;  Surgeon: Hayden Pedro, MD;  Location: [facility];  Service: Ophthalmology;  Laterality: Right;  . Gas insertion  05/10/2012    Procedure: INSERTION OF GAS;  Surgeon: Hayden Pedro, MD;  Location: [facility];  Service: Ophthalmology;  Laterality: Right;    Family History  Problem Relation Age of Onset  .  Anesthesia problems Son     History  Substance Use Topics  . Smoking status: Never Smoker   . Smokeless tobacco: Never Used  . Alcohol Use: No      Review of Systems  Constitutional: Negative for fever and chills.  Respiratory: Negative for shortness of breath.   Cardiovascular: Negative for chest pain and leg swelling.  Musculoskeletal:       LLE pain  Skin: Negative for color change.  All other systems reviewed and are negative.    Allergies  Review of patient's allergies indicates no known allergies.  Home Medications   Current Outpatient Rx  Name  Route  Sig  Dispense  Refill  . acetaminophen (TYLENOL) 500 MG tablet   Oral   Take 1,000 mg by mouth every 6 (six) hours as needed. For headache          . amLODipine (NORVASC) 10 MG tablet   Oral   Take 10 mg by mouth daily.          . calcitRIOL (ROCALTROL) 0.25 MCG capsule   Oral   Take 0.25 mcg by mouth daily.           . carvedilol (COREG) 25 MG tablet   Oral   Take 25 mg by mouth 2 (two) times daily with a meal.         . furosemide (LASIX) 80 MG tablet   Oral   Take 80 mg by mouth 2 (two) times daily.         . insulin glargine (LANTUS SOLOSTAR) 100 UNIT/ML injection   Subcutaneous   Inject 60 Units into the skin at bedtime.         . insulin lispro (HUMALOG) 100 UNIT/ML injection   Subcutaneous   Inject 60 Units into the skin 2 (two) times daily before a meal.         . KLOR-CON M20 20 MEQ tablet   Oral   Take 20 mg by mouth Daily.         . methocarbamol (ROBAXIN) 500 MG tablet   Oral   Take 500 mg by mouth 2 (two) times daily as needed (for muscle spasms).         . polysaccharide iron (NIFEREX) 150 MG CAPS capsule   Oral   Take 1 capsule (150 mg total) by mouth daily.   30 each   2   . traMADol (ULTRAM) 50 MG tablet   Oral   Take 50 mg by mouth every 6 (six) hours as needed for pain.         Marland Kitchen warfarin (COUMADIN) 5 MG tablet   Oral   Take 7.5 mg by mouth every evening.          Marland Kitchen EXPIRED: furosemide (LASIX) 80 MG tablet   Oral   Take 1 tablet (80 mg total) by mouth 2 (two) times daily.   60 tablet   5   . EXPIRED: insulin glargine (LANTUS) 100 UNIT/ML injection   Subcutaneous   Inject 50 Units into the skin at bedtime.   10 mL   12     BP 153/79  Pulse 79  Temp(Src) 98 F (36.7 C) (Oral)  Resp 16  SpO2 93%  Physical Exam  Nursing note and vitals reviewed. Constitutional: He appears well-developed and well-nourished. No distress.  HENT:  Head: Normocephalic and atraumatic.  Mouth/Throat: Oropharynx is clear and moist.  Neck: Normal range of motion. Neck supple.  Cardiovascular: Normal rate, regular  rhythm, normal heart sounds and intact distal pulses.   Pulmonary/Chest: Effort normal and breath sounds normal. No respiratory distress. He has no  wheezes. He has no rales.  Abdominal: Soft. There is no tenderness.  Musculoskeletal: Normal range of motion. He exhibits no edema and no tenderness.  Full ROM of the left hip, knee, and ankle No swelling or erythema of the LLE  Neurological: He is alert. No sensory deficit. Gait normal.  Skin: Skin is warm and dry. He is not diaphoretic. No erythema.  Psychiatric: He has a normal mood and affect.    ED Course  Procedures (including critical care time)  Labs Reviewed - No data to display No results found.   No diagnosis found.  Discussed the patient and the DVT results with Dr. Reynaldo Minium the patient's PCP.  He recommends having the patient take two coumadin tablets tonight and following up to have the INR checked tomorrow.  MDM  Patient with a history of DVT currently on Coumadin presents with a chief complaint of LLE pain.  Doppler done on the LLE showed a non occlusive DVT in the popliteal vein.  Patient's INR today is 1.75.  Discussed DVT results with the patient's PCP Dr. Reynaldo Minium.  He recommends having the patient take two tablets today and following up in the office tomorrow.  Patient denies CP or SOB.  Pulse ox 98 on RA.  No tachycardia.  Feel that patient is stable for discharge.  Patient also found to by hyperglycemic with an initial blood sugar of 554.  Patient given IV Insulin and IVF.  Patient had a normal bicarb and no symptoms of DKA.  Therefore, feel that patient is stable for discharge.  Patient has Insulin at home. Return precautions given to the patient.        Sherlyn Lees [place], PA-C 08/23/12 1536

## 2012-08-22 NOTE — Progress Notes (Signed)
VASCULAR LAB PRELIMINARY  PRELIMINARY  PRELIMINARY  PRELIMINARY  Left lower extremity venous Doppler completed.    Preliminary report:  There is non occlusive, acute DVT noted in the popliteal vein of the left lower extremity.  There is no other DVT noted.  Vineet Kinney, RVT  08/22/2012, 12:53 PM

## 2012-08-22 NOTE — ED Notes (Signed)
Discharge and follow up instructions reviewed. Pt verbalized understanding.  

## 2012-08-22 NOTE — ED Notes (Addendum)
Pt c/o left leg pain x 2days in behind the knee. Pt states pain began in back and radiated down to left knee. Pt has  Hx of dvt and takes coumadin. Pt states he is having difficulty ambulating due to pain. Rates pain 10/10. PA student at bedside.

## 2012-08-23 NOTE — ED Provider Notes (Signed)
Medical screening examination/treatment/procedure(s) were performed by non-physician practitioner and as supervising physician I was immediately available for consultation/collaboration.   Mirna Mires, MD 08/23/12 [phone]

## 2012-08-24 DIAGNOSIS — Z7901 Long term (current) use of anticoagulants: Secondary | ICD-10-CM | POA: Diagnosis not present

## 2012-08-24 DIAGNOSIS — I82409 Acute embolism and thrombosis of unspecified deep veins of unspecified lower extremity: Secondary | ICD-10-CM | POA: Diagnosis not present

## 2012-08-25 DIAGNOSIS — N184 Chronic kidney disease, stage 4 (severe): Secondary | ICD-10-CM | POA: Diagnosis not present

## 2012-08-25 DIAGNOSIS — E1139 Type 2 diabetes mellitus with other diabetic ophthalmic complication: Secondary | ICD-10-CM | POA: Diagnosis not present

## 2012-08-25 DIAGNOSIS — E669 Obesity, unspecified: Secondary | ICD-10-CM | POA: Diagnosis not present

## 2012-08-25 DIAGNOSIS — M5126 Other intervertebral disc displacement, lumbar region: Secondary | ICD-10-CM | POA: Diagnosis not present

## 2012-08-25 DIAGNOSIS — M549 Dorsalgia, unspecified: Secondary | ICD-10-CM | POA: Diagnosis not present

## 2012-08-25 DIAGNOSIS — E1149 Type 2 diabetes mellitus with other diabetic neurological complication: Secondary | ICD-10-CM | POA: Diagnosis not present

## 2012-08-25 DIAGNOSIS — I10 Essential (primary) hypertension: Secondary | ICD-10-CM | POA: Diagnosis not present

## 2012-08-25 DIAGNOSIS — E1129 Type 2 diabetes mellitus with other diabetic kidney complication: Secondary | ICD-10-CM | POA: Diagnosis not present

## 2012-08-25 DIAGNOSIS — I82409 Acute embolism and thrombosis of unspecified deep veins of unspecified lower extremity: Secondary | ICD-10-CM | POA: Diagnosis not present

## 2012-08-29 DIAGNOSIS — IMO0002 Reserved for concepts with insufficient information to code with codable children: Secondary | ICD-10-CM | POA: Diagnosis not present

## 2012-08-29 DIAGNOSIS — M5126 Other intervertebral disc displacement, lumbar region: Secondary | ICD-10-CM | POA: Diagnosis not present

## 2012-08-31 ENCOUNTER — Encounter (HOSPITAL_COMMUNITY)
Admission: RE | Admit: 2012-08-31 | Discharge: 2012-08-31 | Disposition: A | Discharge status: Home / Self Care | Payer: Medicare Other | Source: Ambulatory Visit | Attending: Nephrology | Admitting: Nephrology

## 2012-08-31 DIAGNOSIS — N184 Chronic kidney disease, stage 4 (severe): Secondary | ICD-10-CM | POA: Diagnosis not present

## 2012-08-31 DIAGNOSIS — D638 Anemia in other chronic diseases classified elsewhere: Secondary | ICD-10-CM | POA: Insufficient documentation

## 2012-08-31 LAB — POCT HEMOGLOBIN-HEMACUE: Hemoglobin: 11 g/dL — ABNORMAL LOW (ref 13.0–17.0)

## 2012-08-31 LAB — IRON AND TIBC
Iron: 59 ug/dL (ref 42–135)
Saturation Ratios: 22 % (ref 20–55)
TIBC: 266 ug/dL (ref 215–435)
UIBC: 207 ug/dL (ref 125–400)

## 2012-08-31 LAB — FERRITIN: Ferritin: 564 ng/mL — ABNORMAL HIGH (ref 22–322)

## 2012-08-31 MED ORDER — EPOETIN ALFA 20000 UNIT/ML IJ SOLN
20000.0000 [IU] | INTRAMUSCULAR | Status: DC
  Administered 2012-08-31: 20000 [IU] via SUBCUTANEOUS

## 2012-08-31 MED ORDER — EPOETIN ALFA 20000 UNIT/ML IJ SOLN
INTRAMUSCULAR | Status: AC
  Filled 2012-08-31: qty 1

## 2012-09-08 DIAGNOSIS — Z7901 Long term (current) use of anticoagulants: Secondary | ICD-10-CM | POA: Diagnosis not present

## 2012-09-08 DIAGNOSIS — I82409 Acute embolism and thrombosis of unspecified deep veins of unspecified lower extremity: Secondary | ICD-10-CM | POA: Diagnosis not present

## 2012-09-14 ENCOUNTER — Encounter (HOSPITAL_COMMUNITY)
Admission: RE | Admit: 2012-09-14 | Discharge: 2012-09-14 | Disposition: A | Discharge status: Home / Self Care | Payer: Medicare Other | Source: Ambulatory Visit | Attending: Nephrology | Admitting: Nephrology

## 2012-09-14 LAB — POCT HEMOGLOBIN-HEMACUE: Hemoglobin: 10.8 g/dL — ABNORMAL LOW (ref 13.0–17.0)

## 2012-09-14 MED ORDER — EPOETIN ALFA 20000 UNIT/ML IJ SOLN
INTRAMUSCULAR | Status: AC
  Filled 2012-09-14: qty 1

## 2012-09-14 MED ORDER — EPOETIN ALFA 20000 UNIT/ML IJ SOLN
20000.0000 [IU] | INTRAMUSCULAR | Status: DC
Start: 2012-09-14 — End: 2012-09-15
  Administered 2012-09-14: 20000 [IU] via SUBCUTANEOUS

## 2012-09-21 ENCOUNTER — Encounter: Payer: Self-pay | Admitting: Cardiology

## 2012-09-21 ENCOUNTER — Encounter (INDEPENDENT_AMBULATORY_CARE_PROVIDER_SITE_OTHER): Payer: Medicare Other | Admitting: Ophthalmology

## 2012-09-21 DIAGNOSIS — I10 Essential (primary) hypertension: Secondary | ICD-10-CM | POA: Diagnosis not present

## 2012-09-21 DIAGNOSIS — H35039 Hypertensive retinopathy, unspecified eye: Secondary | ICD-10-CM

## 2012-09-21 DIAGNOSIS — Z01818 Encounter for other preprocedural examination: Secondary | ICD-10-CM | POA: Diagnosis not present

## 2012-09-21 DIAGNOSIS — E1139 Type 2 diabetes mellitus with other diabetic ophthalmic complication: Secondary | ICD-10-CM

## 2012-09-21 DIAGNOSIS — E11359 Type 2 diabetes mellitus with proliferative diabetic retinopathy without macular edema: Secondary | ICD-10-CM | POA: Diagnosis not present

## 2012-09-21 DIAGNOSIS — N184 Chronic kidney disease, stage 4 (severe): Secondary | ICD-10-CM | POA: Diagnosis not present

## 2012-09-21 DIAGNOSIS — H2510 Age-related nuclear cataract, unspecified eye: Secondary | ICD-10-CM

## 2012-09-21 DIAGNOSIS — E1129 Type 2 diabetes mellitus with other diabetic kidney complication: Secondary | ICD-10-CM | POA: Diagnosis not present

## 2012-09-21 DIAGNOSIS — I82409 Acute embolism and thrombosis of unspecified deep veins of unspecified lower extremity: Secondary | ICD-10-CM | POA: Diagnosis not present

## 2012-09-21 DIAGNOSIS — Z7901 Long term (current) use of anticoagulants: Secondary | ICD-10-CM | POA: Diagnosis not present

## 2012-09-21 DIAGNOSIS — Z79899 Other long term (current) drug therapy: Secondary | ICD-10-CM | POA: Diagnosis not present

## 2012-09-21 DIAGNOSIS — E1165 Type 2 diabetes mellitus with hyperglycemia: Secondary | ICD-10-CM | POA: Diagnosis not present

## 2012-09-26 ENCOUNTER — Telehealth (HOSPITAL_COMMUNITY): Payer: Self-pay | Admitting: Internal Medicine

## 2012-09-26 DIAGNOSIS — N184 Chronic kidney disease, stage 4 (severe): Secondary | ICD-10-CM | POA: Diagnosis not present

## 2012-09-26 DIAGNOSIS — N2581 Secondary hyperparathyroidism of renal origin: Secondary | ICD-10-CM | POA: Diagnosis not present

## 2012-09-28 ENCOUNTER — Encounter (HOSPITAL_COMMUNITY)
Admission: RE | Admit: 2012-09-28 | Discharge: 2012-09-28 | Disposition: A | Discharge status: Home / Self Care | Payer: Medicare Other | Source: Ambulatory Visit | Attending: Nephrology | Admitting: Nephrology

## 2012-09-28 LAB — POCT HEMOGLOBIN-HEMACUE: Hemoglobin: 11.1 g/dL — ABNORMAL LOW (ref 13.0–17.0)

## 2012-09-28 LAB — IRON AND TIBC
Iron: 50 ug/dL (ref 42–135)
Saturation Ratios: 16 % — ABNORMAL LOW (ref 20–55)
TIBC: 316 ug/dL (ref 215–435)
UIBC: 266 ug/dL (ref 125–400)

## 2012-09-28 LAB — FERRITIN: Ferritin: 398 ng/mL — ABNORMAL HIGH (ref 22–322)

## 2012-09-28 MED ORDER — EPOETIN ALFA 20000 UNIT/ML IJ SOLN
INTRAMUSCULAR | Status: AC
  Filled 2012-09-28: qty 1

## 2012-09-28 MED ORDER — EPOETIN ALFA 20000 UNIT/ML IJ SOLN
20000.0000 [IU] | INTRAMUSCULAR | Status: DC
  Administered 2012-09-28: 20000 [IU] via SUBCUTANEOUS

## 2012-09-29 HISTORY — PX: VENA CAVA FILTER PLACEMENT: SUR1032

## 2012-10-03 DIAGNOSIS — D649 Anemia, unspecified: Secondary | ICD-10-CM | POA: Diagnosis not present

## 2012-10-03 DIAGNOSIS — N2581 Secondary hyperparathyroidism of renal origin: Secondary | ICD-10-CM | POA: Diagnosis not present

## 2012-10-03 DIAGNOSIS — N184 Chronic kidney disease, stage 4 (severe): Secondary | ICD-10-CM | POA: Diagnosis not present

## 2012-10-03 DIAGNOSIS — I129 Hypertensive chronic kidney disease with stage 1 through stage 4 chronic kidney disease, or unspecified chronic kidney disease: Secondary | ICD-10-CM | POA: Diagnosis not present

## 2012-10-07 DIAGNOSIS — H40019 Open angle with borderline findings, low risk, unspecified eye: Secondary | ICD-10-CM | POA: Diagnosis not present

## 2012-10-07 DIAGNOSIS — E11359 Type 2 diabetes mellitus with proliferative diabetic retinopathy without macular edema: Secondary | ICD-10-CM | POA: Diagnosis not present

## 2012-10-07 DIAGNOSIS — H2510 Age-related nuclear cataract, unspecified eye: Secondary | ICD-10-CM | POA: Diagnosis not present

## 2012-10-07 DIAGNOSIS — E119 Type 2 diabetes mellitus without complications: Secondary | ICD-10-CM | POA: Diagnosis not present

## 2012-10-07 DIAGNOSIS — Z961 Presence of intraocular lens: Secondary | ICD-10-CM | POA: Diagnosis not present

## 2012-10-07 DIAGNOSIS — H35039 Hypertensive retinopathy, unspecified eye: Secondary | ICD-10-CM | POA: Diagnosis not present

## 2012-10-11 ENCOUNTER — Other Ambulatory Visit (HOSPITAL_COMMUNITY): Payer: Self-pay | Admitting: *Deleted

## 2012-10-11 ENCOUNTER — Other Ambulatory Visit (HOSPITAL_COMMUNITY): Payer: Self-pay

## 2012-10-12 ENCOUNTER — Encounter (HOSPITAL_COMMUNITY)
Admission: RE | Admit: 2012-10-12 | Discharge: 2012-10-12 | Disposition: A | Discharge status: Home / Self Care | Payer: Medicare Other | Source: Ambulatory Visit | Attending: Nephrology | Admitting: Nephrology

## 2012-10-12 DIAGNOSIS — D638 Anemia in other chronic diseases classified elsewhere: Secondary | ICD-10-CM | POA: Insufficient documentation

## 2012-10-12 DIAGNOSIS — N184 Chronic kidney disease, stage 4 (severe): Secondary | ICD-10-CM | POA: Diagnosis not present

## 2012-10-12 LAB — POCT HEMOGLOBIN-HEMACUE: Hemoglobin: 9.5 g/dL — ABNORMAL LOW (ref 13.0–17.0)

## 2012-10-12 MED ORDER — EPOETIN ALFA 20000 UNIT/ML IJ SOLN
20000.0000 [IU] | INTRAMUSCULAR | Status: DC
  Administered 2012-10-12: 20000 [IU] via SUBCUTANEOUS

## 2012-10-12 MED ORDER — EPOETIN ALFA 20000 UNIT/ML IJ SOLN
INTRAMUSCULAR | Status: AC
  Administered 2012-10-12: 20000 [IU] via SUBCUTANEOUS
  Filled 2012-10-12: qty 1

## 2012-10-12 MED ORDER — SODIUM CHLORIDE 0.9 % IV SOLN
1020.0000 mg | Freq: Once | INTRAVENOUS | Status: AC
  Administered 2012-10-12: 1020 mg via INTRAVENOUS
  Filled 2012-10-12: qty 34

## 2012-10-18 DIAGNOSIS — I82409 Acute embolism and thrombosis of unspecified deep veins of unspecified lower extremity: Secondary | ICD-10-CM | POA: Diagnosis not present

## 2012-10-18 DIAGNOSIS — Z7901 Long term (current) use of anticoagulants: Secondary | ICD-10-CM | POA: Diagnosis not present

## 2012-10-25 ENCOUNTER — Other Ambulatory Visit: Payer: Self-pay | Admitting: Neurological Surgery

## 2012-10-26 ENCOUNTER — Encounter (HOSPITAL_COMMUNITY)
Admission: RE | Admit: 2012-10-26 | Discharge: 2012-10-26 | Disposition: A | Discharge status: Home / Self Care | Payer: Medicare Other | Source: Ambulatory Visit | Attending: Nephrology | Admitting: Nephrology

## 2012-10-26 LAB — IRON AND TIBC
Iron: 60 ug/dL (ref 42–135)
Saturation Ratios: 27 % (ref 20–55)
TIBC: 226 ug/dL (ref 215–435)
UIBC: 166 ug/dL (ref 125–400)

## 2012-10-26 LAB — POCT HEMOGLOBIN-HEMACUE: Hemoglobin: 10.7 g/dL — ABNORMAL LOW (ref 13.0–17.0)

## 2012-10-26 LAB — FERRITIN: Ferritin: 525 ng/mL — ABNORMAL HIGH (ref 22–322)

## 2012-10-26 MED ORDER — EPOETIN ALFA 20000 UNIT/ML IJ SOLN
20000.0000 [IU] | INTRAMUSCULAR | Status: DC
  Administered 2012-10-26: 20000 [IU] via SUBCUTANEOUS

## 2012-10-26 MED ORDER — EPOETIN ALFA 20000 UNIT/ML IJ SOLN
INTRAMUSCULAR | Status: AC
  Filled 2012-10-26: qty 1

## 2012-10-27 ENCOUNTER — Other Ambulatory Visit: Payer: Self-pay | Admitting: Radiology

## 2012-10-27 ENCOUNTER — Other Ambulatory Visit (HOSPITAL_COMMUNITY): Payer: Self-pay | Admitting: Internal Medicine

## 2012-10-27 DIAGNOSIS — I82409 Acute embolism and thrombosis of unspecified deep veins of unspecified lower extremity: Secondary | ICD-10-CM

## 2012-10-28 ENCOUNTER — Encounter (HOSPITAL_COMMUNITY): Payer: Self-pay | Admitting: Pharmacy Technician

## 2012-10-28 ENCOUNTER — Ambulatory Visit (HOSPITAL_COMMUNITY)
Admission: RE | Admit: 2012-10-28 | Discharge: 2012-10-28 | Disposition: A | Discharge status: Home / Self Care | Payer: Medicare Other | Source: Ambulatory Visit | Attending: Internal Medicine | Admitting: Internal Medicine

## 2012-10-28 DIAGNOSIS — E1149 Type 2 diabetes mellitus with other diabetic neurological complication: Secondary | ICD-10-CM | POA: Insufficient documentation

## 2012-10-28 DIAGNOSIS — Z7901 Long term (current) use of anticoagulants: Secondary | ICD-10-CM | POA: Diagnosis not present

## 2012-10-28 DIAGNOSIS — Z91199 Patient's noncompliance with other medical treatment and regimen due to unspecified reason: Secondary | ICD-10-CM | POA: Diagnosis not present

## 2012-10-28 DIAGNOSIS — C8583 Other specified types of non-Hodgkin lymphoma, intra-abdominal lymph nodes: Secondary | ICD-10-CM | POA: Diagnosis not present

## 2012-10-28 DIAGNOSIS — I129 Hypertensive chronic kidney disease with stage 1 through stage 4 chronic kidney disease, or unspecified chronic kidney disease: Secondary | ICD-10-CM | POA: Diagnosis not present

## 2012-10-28 DIAGNOSIS — Z79899 Other long term (current) drug therapy: Secondary | ICD-10-CM | POA: Insufficient documentation

## 2012-10-28 DIAGNOSIS — E1142 Type 2 diabetes mellitus with diabetic polyneuropathy: Secondary | ICD-10-CM | POA: Diagnosis not present

## 2012-10-28 DIAGNOSIS — Z794 Long term (current) use of insulin: Secondary | ICD-10-CM | POA: Diagnosis not present

## 2012-10-28 DIAGNOSIS — N184 Chronic kidney disease, stage 4 (severe): Secondary | ICD-10-CM | POA: Insufficient documentation

## 2012-10-28 DIAGNOSIS — I739 Peripheral vascular disease, unspecified: Secondary | ICD-10-CM | POA: Diagnosis not present

## 2012-10-28 DIAGNOSIS — I82409 Acute embolism and thrombosis of unspecified deep veins of unspecified lower extremity: Secondary | ICD-10-CM | POA: Diagnosis not present

## 2012-10-28 DIAGNOSIS — E785 Hyperlipidemia, unspecified: Secondary | ICD-10-CM | POA: Diagnosis not present

## 2012-10-28 DIAGNOSIS — Z9119 Patient's noncompliance with other medical treatment and regimen: Secondary | ICD-10-CM | POA: Insufficient documentation

## 2012-10-28 DIAGNOSIS — G4730 Sleep apnea, unspecified: Secondary | ICD-10-CM | POA: Insufficient documentation

## 2012-10-28 LAB — BASIC METABOLIC PANEL
BUN: 36 mg/dL — ABNORMAL HIGH (ref 6–23)
CO2: 24 mEq/L (ref 19–32)
Calcium: 8.7 mg/dL (ref 8.4–10.5)
Chloride: 103 mEq/L (ref 96–112)
Creatinine, Ser: 2.79 mg/dL — ABNORMAL HIGH (ref 0.50–1.35)
GFR calc Af Amer: 27 mL/min — ABNORMAL LOW (ref >90)
GFR calc non Af Amer: 23 mL/min — ABNORMAL LOW (ref >90)
Glucose, Bld: 333 mg/dL — ABNORMAL HIGH (ref 70–99)
Potassium: 4 mEq/L (ref 3.5–5.1)
Sodium: 137 mEq/L (ref 135–145)

## 2012-10-28 LAB — GLUCOSE, CAPILLARY
Glucose-Capillary: 291 mg/dL — ABNORMAL HIGH (ref 70–99)
Glucose-Capillary: 60 mg/dL — ABNORMAL LOW (ref 70–99)
Glucose-Capillary: 37 mg/dL — CL (ref 70–99)
Glucose-Capillary: 94 mg/dL (ref 70–99)

## 2012-10-28 LAB — CBC
HCT: 30.1 % — ABNORMAL LOW (ref 39.0–52.0)
Hemoglobin: 10.1 g/dL — ABNORMAL LOW (ref 13.0–17.0)
MCH: 26.4 pg (ref 26.0–34.0)
MCHC: 33.6 g/dL (ref 30.0–36.0)
MCV: 78.8 fL (ref 78.0–100.0)
Platelets: 254 10*3/uL (ref 150–400)
RBC: 3.82 MIL/uL — ABNORMAL LOW (ref 4.22–5.81)
RDW: 16.6 % — ABNORMAL HIGH (ref 11.5–15.5)
WBC: 4.9 10*3/uL (ref 4.0–10.5)

## 2012-10-28 LAB — PROTIME-INR
INR: 1.89 — ABNORMAL HIGH (ref 0.00–1.49)
Prothrombin Time: 21 seconds — ABNORMAL HIGH (ref 11.6–15.2)

## 2012-10-28 LAB — APTT: aPTT: 46 seconds — ABNORMAL HIGH (ref 24–37)

## 2012-10-28 MED ORDER — FENTANYL CITRATE 0.05 MG/ML IJ SOLN
INTRAMUSCULAR | Status: AC
  Filled 2012-10-28: qty 2

## 2012-10-28 MED ORDER — DEXTROSE 50 % IV SOLN
50.0000 mL | Freq: Once | INTRAVENOUS | Status: AC | PRN
  Administered 2012-10-28: 50 mL via INTRAVENOUS

## 2012-10-28 MED ORDER — DEXTROSE 50 % IV SOLN: 25.0000 mL | Freq: Once | INTRAVENOUS | Status: AC | PRN

## 2012-10-28 MED ORDER — SODIUM CHLORIDE 0.9 % IV SOLN
Freq: Once | INTRAVENOUS | Status: AC
  Administered 2012-10-28: 12:00:00 via INTRAVENOUS

## 2012-10-28 MED ORDER — MIDAZOLAM HCL 2 MG/2ML IJ SOLN
INTRAMUSCULAR | Status: AC
  Filled 2012-10-28: qty 2

## 2012-10-28 MED ORDER — MIDAZOLAM HCL 2 MG/2ML IJ SOLN
INTRAMUSCULAR | Status: AC | PRN
  Administered 2012-10-28: 1 mg via INTRAVENOUS

## 2012-10-28 MED ORDER — FENTANYL CITRATE 0.05 MG/ML IJ SOLN
INTRAMUSCULAR | Status: AC | PRN
  Administered 2012-10-28: 50 ug via INTRAVENOUS

## 2012-10-28 MED ORDER — HYDROCODONE-ACETAMINOPHEN 5-325 MG PO TABS: 1.0000 | ORAL_TABLET | ORAL | Status: DC | PRN

## 2012-10-28 MED ORDER — DEXTROSE 50 % IV SOLN
INTRAVENOUS | Status: AC
  Filled 2012-10-28: qty 50

## 2012-10-28 NOTE — ED Notes (Signed)
Patient Given PO fluids and a patient meal for low CBG.

## 2012-10-28 NOTE — Progress Notes (Signed)
Patients CBG is 37. Pt clammy, sweaty. Hypoglycemia orders initiated and 50gm dextrose given via IV.

## 2012-10-28 NOTE — H&P (Signed)
[name] is an 62 y.o. male.   Chief Complaint: "I'm here to get a filter put in" HPI: Patient with history of bilateral LE DVT'S (most recently on the left and on coumadin) and planned L5-S1 microdiscetomy next week presents today for IVC filter placement.  Past Medical History  Diagnosis Date  . Cancer   . Diabetes mellitus   . DVT (deep venous thrombosis)     Right leg  . Diabetic retinopathy(362.0)   . Hypertension   . Hyperlipidemia   . Non Hodgkin's lymphoma     Tx 2009  . History of cardiac catheterization     pt denies this on 03/15/2012  . Nodular lymphoma of intra-abdominal lymph nodes   . Shortness of breath   . Sleep apnea     "suppose to have a sleep studfy, but they never told me when.  . Peripheral vascular disease   . Blood transfusion   . 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  Marland Kitchen Headache   . Family history of anesthesia complication     " my son cant have it because he cant wake up "  . Noncompliance 03/16/2012  . CKD (chronic kidney disease) stage 4, GFR 15-29 ml/min 03/18/2012  . NSVT (nonsustained ventricular tachycardia) 03/18/2012    Past Surgical History  Procedure Laterality Date  . Porta catheter    . Porta catheter insertion    . Porta catheter removed    . Coloscopy    . Pars plana vitrectomy  08/27/2011    Procedure: PARS PLANA VITRECTOMY WITH 25 GAUGE;  Surgeon: Hayden Pedro, MD;  Location: [place];  Service: Ophthalmology;  Laterality: Left;  Repair of complex traction retinal detachment left eye  . Cardiac catheterization      pt denies this on 03/15/2012  . Eye surgery    . Viterectomy  05/10/2012    od  . Pars plana vitrectomy  05/10/2012    Procedure: PARS PLANA VITRECTOMY WITH 25 GAUGE;  Surgeon: Hayden Pedro, MD;  Location: [place];  Service: Ophthalmology;  Laterality: Right;  Repair Complex Traction Retinal Detachment  . Membrane peel  05/10/2012    Procedure: MEMBRANE PEEL;  Surgeon: Hayden Pedro, MD;   Location: [place];  Service: Ophthalmology;  Laterality: Right;  . Photocoagulation with laser  05/10/2012    Procedure: PHOTOCOAGULATION WITH LASER;  Surgeon: Hayden Pedro, MD;  Location: [place];  Service: Ophthalmology;  Laterality: Right;  . Gas insertion  05/10/2012    Procedure: INSERTION OF GAS;  Surgeon: Hayden Pedro, MD;  Location: [place];  Service: Ophthalmology;  Laterality: Right;    Family History  Problem Relation Age of Onset  . Anesthesia problems Son    Social History:  reports that he has never smoked. He has never used smokeless tobacco. He reports that he does not drink alcohol or use illicit drugs.  Allergies: No Known Allergies Current outpatient prescriptions:acetaminophen (TYLENOL) 500 MG tablet, Take 1,000 mg by mouth every 6 (six) hours as needed. For headache , Disp: , Rfl: ;  amLODipine (NORVASC) 10 MG tablet, Take 10 mg by mouth daily., Disp: , Rfl: ;  calcitRIOL (ROCALTROL) 0.25 MCG capsule, Take 0.25 mcg by mouth daily.  , Disp: , Rfl: ;  carvedilol (COREG) 25 MG tablet, Take 25 mg by mouth 2 (two) times daily with a meal., Disp: , Rfl:  furosemide (LASIX) 80 MG tablet, Take 80 mg by mouth 2 (  two) times daily., Disp: , Rfl: ;  insulin glargine (LANTUS) 100 UNIT/ML injection, Inject 50 Units into the skin at bedtime., Disp: 10 mL, Rfl: 12;  insulin lispro (HUMALOG) 100 UNIT/ML injection, Inject 60 Units into the skin 2 (two) times daily before a meal., Disp: , Rfl: ;  KLOR-CON M20 20 MEQ tablet, Take 20 mg by mouth Daily., Disp: , Rfl:  methocarbamol (ROBAXIN) 500 MG tablet, Take 500 mg by mouth 2 (two) times daily as needed (for muscle spasms)., Disp: , Rfl: ;  polysaccharide iron (NIFEREX) 150 MG CAPS capsule, Take 1 capsule (150 mg total) by mouth daily., Disp: 30 each, Rfl: 2;  traMADol (ULTRAM) 50 MG tablet, Take 50 mg by mouth every 6 (six) hours as needed for pain., Disp: , Rfl: ;  warfarin (COUMADIN) 5 MG tablet, Take 7.5 mg by mouth every evening. , Disp: ,  Rfl:    Results for orders placed during the hospital encounter of 10/28/12 (from the past 48 hour(s))  GLUCOSE, CAPILLARY     Status: Abnormal   Collection Time    10/28/12 11:24 AM      Result Value Range   Glucose-Capillary 291 (*) 70 - 99 mg/dL  APTT     Status: Abnormal   Collection Time    10/28/12 11:38 AM      Result Value Range   aPTT 46 (*) 24 - 37 seconds   Comment:            IF BASELINE aPTT IS ELEVATED,     SUGGEST PATIENT RISK ASSESSMENT     BE USED TO DETERMINE APPROPRIATE     ANTICOAGULANT THERAPY.  CBC     Status: Abnormal   Collection Time    10/28/12 11:38 AM      Result Value Range   WBC 4.9  4.0 - 10.5 K/uL   RBC 3.82 (*) 4.22 - 5.81 MIL/uL   Hemoglobin 10.1 (*) 13.0 - 17.0 g/dL   HCT 30.1 (*) 39.0 - 52.0 %   MCV 78.8  78.0 - 100.0 fL   MCH 26.4  26.0 - 34.0 pg   MCHC 33.6  30.0 - 36.0 g/dL   RDW 16.6 (*) 11.5 - 15.5 %   Platelets 254  150 - 400 K/uL  PROTIME-INR     Status: Abnormal   Collection Time    10/28/12 11:38 AM      Result Value Range   Prothrombin Time 21.0 (*) 11.6 - 15.2 seconds   INR 1.89 (*) 0.00 - 1.49   No results found.  Review of Systems  Constitutional: Negative for fever and chills.  Respiratory: Negative for cough and shortness of breath.   Cardiovascular: Positive for leg swelling. Negative for chest pain.  Gastrointestinal: Negative for nausea, vomiting and abdominal pain.  Musculoskeletal: Negative for back pain.  Neurological: Negative for headaches.  Endo/Heme/Allergies: Does not bruise/bleed easily.    Blood pressure 170/92, pulse 80, temperature 96.8 F (36 C), temperature source Oral, resp. rate 18, height 6' 3.5" (1.918 m), weight 290 lb (131.543 kg), SpO2 96.00%. Physical Exam  Constitutional: He is oriented to person, place, and time. He appears well-developed and well-nourished.  Cardiovascular: Normal rate and regular rhythm.   Respiratory: Effort normal.  Few rt basilar crackles, left clear  GI: Soft.  Bowel sounds are normal. There is no tenderness.  obese  Musculoskeletal: Normal range of motion. He exhibits edema.  Neurological: He is alert and oriented to person, place, and time.  Assessment/Plan Pt with hx LE DVT's(on coumadin) and planned L5-S1 microdiscetomy next week. Plan is for IVC filter placement today for PE prophylaxis. Details/risks of above d/w pt with his understanding and consent.  ALLRED,D KEVIN 10/28/2012, 12:14 PM

## 2012-10-28 NOTE — Progress Notes (Signed)
Pt CBG 97

## 2012-10-28 NOTE — Procedures (Signed)
IVCgram IVC filter No complication No blood loss. See complete dictation in [facility].

## 2012-10-31 ENCOUNTER — Other Ambulatory Visit: Payer: Self-pay | Admitting: Neurological Surgery

## 2012-11-01 ENCOUNTER — Encounter (HOSPITAL_COMMUNITY)
Admission: RE | Admit: 2012-11-01 | Discharge: 2012-11-01 | Disposition: A | Discharge status: Home / Self Care | Payer: Medicare Other | Source: Ambulatory Visit | Attending: Neurological Surgery | Admitting: Neurological Surgery

## 2012-11-01 ENCOUNTER — Encounter (HOSPITAL_COMMUNITY): Payer: Self-pay

## 2012-11-01 DIAGNOSIS — Z01812 Encounter for preprocedural laboratory examination: Secondary | ICD-10-CM | POA: Diagnosis not present

## 2012-11-01 DIAGNOSIS — Z01818 Encounter for other preprocedural examination: Secondary | ICD-10-CM | POA: Diagnosis not present

## 2012-11-01 DIAGNOSIS — I82409 Acute embolism and thrombosis of unspecified deep veins of unspecified lower extremity: Secondary | ICD-10-CM | POA: Diagnosis not present

## 2012-11-01 DIAGNOSIS — Z7901 Long term (current) use of anticoagulants: Secondary | ICD-10-CM | POA: Diagnosis not present

## 2012-11-01 HISTORY — DX: Unspecified osteoarthritis, unspecified site: M19.90

## 2012-11-01 HISTORY — DX: Anemia, unspecified: D64.9

## 2012-11-01 HISTORY — DX: Pneumonia, unspecified organism: J18.9

## 2012-11-01 HISTORY — DX: Gastro-esophageal reflux disease without esophagitis: K21.9

## 2012-11-01 LAB — CBC WITH DIFFERENTIAL/PLATELET
Basophils Absolute: 0 10*3/uL (ref 0.0–0.1)
Basophils Relative: 1 % (ref 0–1)
Eosinophils Absolute: 0.1 10*3/uL (ref 0.0–0.7)
Eosinophils Relative: 2 % (ref 0–5)
HCT: 34.1 % — ABNORMAL LOW (ref 39.0–52.0)
Hemoglobin: 11.5 g/dL — ABNORMAL LOW (ref 13.0–17.0)
Lymphocytes Relative: 40 % (ref 12–46)
Lymphs Abs: 2 10*3/uL (ref 0.7–4.0)
MCH: 26.4 pg (ref 26.0–34.0)
MCHC: 33.7 g/dL (ref 30.0–36.0)
MCV: 78.4 fL (ref 78.0–100.0)
Monocytes Absolute: 0.3 10*3/uL (ref 0.1–1.0)
Monocytes Relative: 7 % (ref 3–12)
Neutro Abs: 2.5 10*3/uL (ref 1.7–7.7)
Neutrophils Relative %: 50 % (ref 43–77)
Platelets: 287 10*3/uL (ref 150–400)
RBC: 4.35 MIL/uL (ref 4.22–5.81)
RDW: 16.5 % — ABNORMAL HIGH (ref 11.5–15.5)
WBC: 4.9 10*3/uL (ref 4.0–10.5)

## 2012-11-01 LAB — PROTIME-INR
INR: 1.14 (ref 0.00–1.49)
Prothrombin Time: 14.4 seconds (ref 11.6–15.2)

## 2012-11-01 LAB — SURGICAL PCR SCREEN
MRSA, PCR: POSITIVE — AB
Staphylococcus aureus: POSITIVE — AB

## 2012-11-01 LAB — BASIC METABOLIC PANEL
BUN: 37 mg/dL — ABNORMAL HIGH (ref 6–23)
CO2: 21 mEq/L (ref 19–32)
Calcium: 9.6 mg/dL (ref 8.4–10.5)
Chloride: 110 mEq/L (ref 96–112)
Creatinine, Ser: 3.01 mg/dL — ABNORMAL HIGH (ref 0.50–1.35)
GFR calc Af Amer: 25 mL/min — ABNORMAL LOW (ref >90)
GFR calc non Af Amer: 21 mL/min — ABNORMAL LOW (ref >90)
Glucose, Bld: 61 mg/dL — ABNORMAL LOW (ref 70–99)
Potassium: 3.6 mEq/L (ref 3.5–5.1)
Sodium: 141 mEq/L (ref 135–145)

## 2012-11-01 NOTE — Progress Notes (Signed)
Call to A. Zelenak,PAC, requested anesth. Consult per MD order & pt. H/o of DVT & insertion of IVC filter last week.

## 2012-11-01 NOTE — Progress Notes (Signed)
11/01/12 [place]  Have you ever been diagnosed with sleep apnea through a sleep study? No  Do you snore loudly (loud enough to be heard through closed doors)?  0 (pt. lives alone & doesn't know if he snores or stops breathi)  Do you often feel tired, fatigued, or sleepy during the daytime? 0  Has anyone observed you stop breathing during your sleep? 0  Do you have, or are you being treated for high blood pressure? 1  BMI more than 35 kg/m2? 1  Age over 64 years old? 1  Neck circumference greater than 40 cm/18 inches? 1  Gender: 1  Obstructive Sleep Apnea Score 5  Score 4 or greater  Results sent to PCP

## 2012-11-01 NOTE — Pre-Procedure Instructions (Signed)
SAMEER WOODRING  11/01/2012   Your procedure is scheduled on:  11/09/2012  Report to [place] at 10:00 AM.  Call this number if you have problems the morning of surgery: [phone]   Remember:   Do not eat food or drink liquids after midnight.  TUESDAY   Take these medicines the morning of surgery with A SIP OF WATER: CARVEDILOL, TRAMADOL   Do not wear jewelry  Do not wear lotions, powders, or perfumes. You may wear deodorant.             Men may shave face and neck.  Do not bring valuables to the hospital.  [facility] is not responsible                   for any belongings or valuables.  Contacts, dentures or bridgework may not be worn into surgery.  Leave suitcase in the car. After surgery it may be brought to your room.  For patients admitted to the hospital, checkout time is 11:00 AM the day of  discharge.   Patients discharged the day of surgery will not be allowed to drive home.  Name and phone number of your driver: with son  Special Instructions: Shower using CHG 2 nights before surgery and the night before surgery.  If you shower the day of surgery use CHG.  Use special wash - you have one bottle of CHG for all showers.  You should use approximately 1/3 of the bottle for each shower.   Please read over the following fact sheets that you were given: Pain Booklet, Coughing and Deep Breathing, MRSA Information and Surgical Site Infection Prevention

## 2012-11-01 NOTE — Progress Notes (Signed)
Please note the STOP BANG assessment that was done at a presurgical screen

## 2012-11-02 ENCOUNTER — Encounter (HOSPITAL_COMMUNITY): Payer: Self-pay | Admitting: Anesthesiology

## 2012-11-02 ENCOUNTER — Encounter (HOSPITAL_COMMUNITY): Payer: Self-pay | Admitting: Vascular Surgery

## 2012-11-02 NOTE — Progress Notes (Signed)
Anesthesia PAT Evaluation: Patient is a 62 year old male scheduled for left L5-S1 microdiscectomy on 11/09/2012 by Dr. Ronnald Ramp. History includes chronic kidney disease stage IV, diabetes mellitus on insulin, HTN, NSVT, obstructive sleep apnea, obesity, non-hodgkin's lymphoma '09, anemia of chronic disease, PVD, GERD, headaches, family history of waking up "slowly" from anesthesia.  He was found to have a partially occlusive acute DVT involving the left popliteal vein 08/22/12.  His PCP Dr. Reynaldo Minium has been managing his Coumadin with reported instructions to hold it five days prior to surgery.  He also underwent insertion of a retrievable infrarenal IVC filter on 10/28/12 in anticipation for upcoming surgery.  Nephrologist is Dr. Elmarie Shiley, who felt his labs were stable and "no electrolyte contraindication for surgery."    Patient was seen during his PAT visit.  He was sitting in a wheelchair.  He denied chest pain, SOB, and uremic symptoms such as nausea, fatigue.  He reports fasting glucose readings are typically ~ 130-140's.  Exam shows a pleasant black male in NAD. His heart with a RRR, lungs diminished by clear.  1-2 + BLE edema (he is wearing TED hose).    EKG on 03/15/12 showed NSR, probable LAE, right BBB, LAFB, LVH.    Nuclear stress test on 03/18/12 showed: 1. No infarct or ischemia.  2. Calculated ejection fraction 58%.   Echo on 05/21/11 showed: - Left ventricle: The cavity size was normal. There was moderate concentric hypertrophy. Systolic function was normal. The estimated ejection fraction was in the range of 60% to 65%. Wall motion was normal; there were no regional wall motion abnormalities. - Aortic root: The aortic root was mildly dilated. - Mitral valve: Mild regurgitation. - Left atrium: The atrium was moderately dilated. - Right atrium: The atrium was mildly dilated. - Pericardium, extracardiac: A trivial pericardial effusion was identified.  CXR on 11/01/12 showed stable moderate  cardiomegaly.  No active lung disease.  Preoperative labs noted. His BUN/Cr is 37/3.01 (previously 36/2.79 on 10/28/12 and 39/2.98 on 09/26/12 with his Cr range from ~ 2.7-3.4 since 05/2011). K 3.6.  Glucose 61. H/H 11.5/34.1.  He had previously held his Coumadin because he just learned that his surgery was moved from 11/04/11 to 11/09/12.  He was going to check with Dr. Reynaldo Minium to see if he should just continue to hold Coumadin or restart for a few days and then hold again five days prior to surgery.  I'll plan to check and ISTAT and coags on the day of surgery.    His renal function appears stable.  I did discuss potential risk of worsening renal function post-operatively that could result in need for hemodialysis.  He does not have any chest pain and had a normal stress test within the past year.  He does have a LLE partially occlusion DVT diagnosed in March of this year, but is s/p IVC last week in anticipation of surgery.  If follow-up labs are reasonable and no significant change in his status then I would anticipate that he could proceed as planned.  George Hugh [facility] Short Stay Center/Anesthesiology Phone [phone] 11/02/2012 5:35 PM

## 2012-11-03 ENCOUNTER — Inpatient Hospital Stay: Admit: 2012-11-03 | Payer: Self-pay | Admitting: Neurological Surgery

## 2012-11-03 SURGERY — LUMBAR LAMINECTOMY/DECOMPRESSION MICRODISCECTOMY 1 LEVEL
Anesthesia: General | Site: Back

## 2012-11-08 MED ORDER — DEXAMETHASONE SODIUM PHOSPHATE 10 MG/ML IJ SOLN
10.0000 mg | INTRAMUSCULAR | Status: DC
  Filled 2012-11-08: qty 1

## 2012-11-08 MED ORDER — DEXTROSE 5 % IV SOLN
3.0000 g | INTRAVENOUS | Status: DC
  Filled 2012-11-08: qty 3000

## 2012-11-09 ENCOUNTER — Ambulatory Visit (HOSPITAL_COMMUNITY)
Admission: RE | Admit: 2012-11-09 | Discharge: 2012-11-09 | Disposition: A | Discharge status: Home / Self Care | Payer: Medicare Other | Source: Ambulatory Visit | Attending: Neurological Surgery | Admitting: Neurological Surgery

## 2012-11-09 ENCOUNTER — Encounter (HOSPITAL_COMMUNITY)
Admission: RE | Disposition: A | Discharge status: Home / Self Care | Payer: Self-pay | Source: Ambulatory Visit | Attending: Neurological Surgery

## 2012-11-09 ENCOUNTER — Ambulatory Visit (HOSPITAL_COMMUNITY)
Admission: RE | Admit: 2012-11-09 | Discharge: 2012-11-09 | Disposition: A | Discharge status: Home / Self Care | Payer: Medicare Other | Source: Ambulatory Visit | Attending: Nephrology | Admitting: Nephrology

## 2012-11-09 ENCOUNTER — Encounter (HOSPITAL_COMMUNITY): Payer: Self-pay | Admitting: Surgery

## 2012-11-09 DIAGNOSIS — R609 Edema, unspecified: Secondary | ICD-10-CM | POA: Diagnosis not present

## 2012-11-09 DIAGNOSIS — Z538 Procedure and treatment not carried out for other reasons: Secondary | ICD-10-CM | POA: Diagnosis not present

## 2012-11-09 DIAGNOSIS — N19 Unspecified kidney failure: Secondary | ICD-10-CM | POA: Diagnosis not present

## 2012-11-09 DIAGNOSIS — M5126 Other intervertebral disc displacement, lumbar region: Secondary | ICD-10-CM | POA: Diagnosis not present

## 2012-11-09 LAB — BASIC METABOLIC PANEL
BUN: 22 mg/dL (ref 6–23)
CO2: 25 mEq/L (ref 19–32)
Calcium: 8.7 mg/dL (ref 8.4–10.5)
Chloride: 108 mEq/L (ref 96–112)
Creatinine, Ser: 2.93 mg/dL — ABNORMAL HIGH (ref 0.50–1.35)
GFR calc Af Amer: 25 mL/min — ABNORMAL LOW (ref >90)
GFR calc non Af Amer: 22 mL/min — ABNORMAL LOW (ref >90)
Glucose, Bld: 185 mg/dL — ABNORMAL HIGH (ref 70–99)
Potassium: 3.4 mEq/L — ABNORMAL LOW (ref 3.5–5.1)
Sodium: 141 mEq/L (ref 135–145)

## 2012-11-09 LAB — POCT I-STAT 4, (NA,K, GLUC, HGB,HCT)
Glucose, Bld: 184 mg/dL — ABNORMAL HIGH (ref 70–99)
HCT: 34 % — ABNORMAL LOW (ref 39.0–52.0)
Hemoglobin: 11.6 g/dL — ABNORMAL LOW (ref 13.0–17.0)
Potassium: 3.6 mEq/L (ref 3.5–5.1)
Sodium: 143 mEq/L (ref 135–145)

## 2012-11-09 LAB — PROTIME-INR
INR: 1.11 (ref 0.00–1.49)
Prothrombin Time: 14.2 seconds (ref 11.6–15.2)

## 2012-11-09 LAB — APTT: aPTT: 36 seconds (ref 24–37)

## 2012-11-09 SURGERY — CANCELLED PROCEDURE

## 2012-11-09 MED ORDER — CARVEDILOL 25 MG PO TABS
25.0000 mg | ORAL_TABLET | Freq: Once | ORAL | Status: AC
  Administered 2012-11-09: 25 mg via ORAL
  Filled 2012-11-09: qty 1

## 2012-11-09 MED ORDER — EPOETIN ALFA 20000 UNIT/ML IJ SOLN: 20000.0000 [IU] | INTRAMUSCULAR | Status: DC

## 2012-11-09 SURGICAL SUPPLY — 34 items
APL SKNCLS STERI-STRIP NONHPOA (GAUZE/BANDAGES/DRESSINGS) ×2
BAG DECANTER FOR FLEXI CONT (MISCELLANEOUS) ×3 IMPLANT
BENZOIN TINCTURE PRP APPL 2/3 (GAUZE/BANDAGES/DRESSINGS) ×3 IMPLANT
CANISTER SUCTION 2500CC (MISCELLANEOUS) ×3 IMPLANT
CLOTH BEACON ORANGE TIMEOUT ST (SAFETY) ×3 IMPLANT
CONT SPEC 4OZ CLIKSEAL STRL BL (MISCELLANEOUS) ×3 IMPLANT
DRAPE LAPAROTOMY 100X72X124 (DRAPES) ×3 IMPLANT
DRAPE MICROSCOPE ZEISS OPMI (DRAPES) ×3 IMPLANT
DRAPE POUCH INSTRU U-SHP 10X18 (DRAPES) ×3 IMPLANT
DRAPE SURG 17X23 STRL (DRAPES) ×3 IMPLANT
DRESSING TELFA 8X3 (GAUZE/BANDAGES/DRESSINGS) ×3 IMPLANT
DRSG OPSITE 4X5.5 SM (GAUZE/BANDAGES/DRESSINGS) ×3 IMPLANT
DURAPREP 26ML APPLICATOR (WOUND CARE) ×3 IMPLANT
ELECT REM PT RETURN 9FT ADLT (ELECTROSURGICAL) ×3
ELECTRODE REM PT RTRN 9FT ADLT (ELECTROSURGICAL) ×2 IMPLANT
GAUZE SPONGE 4X4 16PLY XRAY LF (GAUZE/BANDAGES/DRESSINGS) IMPLANT
GLOVE BIOGEL M 8.0 STRL (GLOVE) ×3 IMPLANT
GOWN BRE IMP SLV AUR LG STRL (GOWN DISPOSABLE) IMPLANT
GOWN BRE IMP SLV AUR XL STRL (GOWN DISPOSABLE) ×3 IMPLANT
GOWN STRL REIN 2XL LVL4 (GOWN DISPOSABLE) IMPLANT
KIT ROOM TURNOVER OR (KITS) ×3 IMPLANT
NDL HYPO 25X1 1.5 SAFETY (NEEDLE) ×1 IMPLANT
NDL SPNL 20GX3.5 QUINCKE YW (NEEDLE) IMPLANT
NEEDLE HYPO 25X1 1.5 SAFETY (NEEDLE) ×3 IMPLANT
NEEDLE SPNL 20GX3.5 QUINCKE YW (NEEDLE) IMPLANT
NS IRRIG 1000ML POUR BTL (IV SOLUTION) ×3 IMPLANT
PAD ARMBOARD 7.5X6 YLW CONV (MISCELLANEOUS) ×9 IMPLANT
RUBBERBAND STERILE (MISCELLANEOUS) ×6 IMPLANT
SPONGE SURGIFOAM ABS GEL SZ50 (HEMOSTASIS) ×3 IMPLANT
STRIP CLOSURE SKIN 1/2X4 (GAUZE/BANDAGES/DRESSINGS) ×3 IMPLANT
SYR 20ML ECCENTRIC (SYRINGE) ×3 IMPLANT
TOWEL OR 17X24 6PK STRL BLUE (TOWEL DISPOSABLE) ×3 IMPLANT
TOWEL OR 17X26 10 PK STRL BLUE (TOWEL DISPOSABLE) ×3 IMPLANT
WATER STERILE IRR 1000ML POUR (IV SOLUTION) ×3 IMPLANT

## 2012-11-09 NOTE — Anesthesia Preprocedure Evaluation (Deleted)
Anesthesia Evaluation  Patient identified by MRN, date of birth, ID band Patient awake    Reviewed: Allergy & Precautions, H&P , NPO status , Patient's Chart, lab work & pertinent test results, reviewed documented beta blocker date and time   History of Anesthesia Complications Negative for: history of anesthetic complications  Airway       Dental   Pulmonary shortness of breath, sleep apnea (not confirmed by sleep study) , neg pneumonia -,          Cardiovascular hypertension, + Peripheral Vascular Disease and DVT (off Coumadin since Friday, has [place] filter) + dysrhythmias  '13 stress myoview: no ischemia or infarct, EF 58%   Neuro/Psych    GI/Hepatic Neg liver ROS, GERD-  Controlled,  Endo/Other  diabetes (glu 184), Well Controlled, Type 2, Insulin DependentMorbid obesity  Renal/GU Renal InsufficiencyRenal disease (creat 3.01)     Musculoskeletal   Abdominal   Peds  Hematology negative hematology ROS (+)   Anesthesia Other Findings H/o lymphoma  Reproductive/Obstetrics                        Anesthesia Physical Anesthesia Plan Anesthesia Quick Evaluation

## 2012-11-09 NOTE — H&P (Signed)
  Pt presented today for elective L5-S1 microdiscectomy. However, he presented with severe swelling in his lower extremities for 3 days duration. He has significant renal failure and was cleared for surgery by his nephrologist about 6 weeks ago. However, the anesthesiologist has significant concerns regarding this new swelling and a stat BMET has been sent. The patient states his leg pain has improved. He does not have leg pain every day. "It comes and goes." It doesn't seem to be a severe. Given the improvement in his symptoms and worsening of his edema and a question of kidney and heart function, I think it is best to cancel his elective surgery and have him followup with his nephrologist. He can follow with me in one month. I have explained all this to the patient and his family.

## 2012-11-14 DIAGNOSIS — N184 Chronic kidney disease, stage 4 (severe): Secondary | ICD-10-CM | POA: Diagnosis not present

## 2012-11-23 ENCOUNTER — Encounter (HOSPITAL_COMMUNITY): Payer: Medicare Other

## 2012-12-05 ENCOUNTER — Other Ambulatory Visit: Payer: Self-pay | Admitting: *Deleted

## 2012-12-05 MED ORDER — AMLODIPINE BESYLATE 10 MG PO TABS: 10.0000 mg | ORAL_TABLET | Freq: Every day | ORAL | Status: DC

## 2012-12-14 ENCOUNTER — Encounter: Payer: Self-pay | Admitting: Internal Medicine

## 2012-12-15 ENCOUNTER — Encounter (HOSPITAL_COMMUNITY)
Admission: RE | Admit: 2012-12-15 | Discharge: 2012-12-15 | Disposition: A | Discharge status: Home / Self Care | Payer: Medicare Other | Source: Ambulatory Visit | Attending: Nephrology | Admitting: Nephrology

## 2012-12-15 DIAGNOSIS — D638 Anemia in other chronic diseases classified elsewhere: Secondary | ICD-10-CM | POA: Insufficient documentation

## 2012-12-15 DIAGNOSIS — N184 Chronic kidney disease, stage 4 (severe): Secondary | ICD-10-CM | POA: Insufficient documentation

## 2012-12-15 LAB — IRON AND TIBC
Iron: 57 ug/dL (ref 42–135)
Saturation Ratios: 23 % (ref 20–55)
TIBC: 250 ug/dL (ref 215–435)
UIBC: 193 ug/dL (ref 125–400)

## 2012-12-15 LAB — FERRITIN: Ferritin: 370 ng/mL — ABNORMAL HIGH (ref 22–322)

## 2012-12-15 LAB — POCT HEMOGLOBIN-HEMACUE: Hemoglobin: 10.3 g/dL — ABNORMAL LOW (ref 13.0–17.0)

## 2012-12-15 MED ORDER — EPOETIN ALFA 20000 UNIT/ML IJ SOLN
INTRAMUSCULAR | Status: AC
  Administered 2012-12-15: 20000 [IU] via SUBCUTANEOUS
  Filled 2012-12-15: qty 1

## 2012-12-15 MED ORDER — EPOETIN ALFA 20000 UNIT/ML IJ SOLN: 20000.0000 [IU] | INTRAMUSCULAR | Status: DC

## 2012-12-27 ENCOUNTER — Emergency Department (HOSPITAL_COMMUNITY): Payer: Medicare Other

## 2012-12-27 ENCOUNTER — Encounter (HOSPITAL_COMMUNITY): Payer: Self-pay

## 2012-12-27 ENCOUNTER — Encounter (HOSPITAL_COMMUNITY)
Admission: EM | Disposition: A | Discharge status: Home / Self Care | Payer: Self-pay | Source: Home / Self Care | Attending: Internal Medicine

## 2012-12-27 ENCOUNTER — Inpatient Hospital Stay (HOSPITAL_COMMUNITY)
Admission: EM | Admit: 2012-12-27 | Discharge: 2012-12-29 | DRG: 194 | Disposition: A | Discharge status: Home / Self Care | Payer: Medicare Other | Attending: Internal Medicine | Admitting: Internal Medicine

## 2012-12-27 ENCOUNTER — Inpatient Hospital Stay (HOSPITAL_COMMUNITY): Payer: Medicare Other

## 2012-12-27 DIAGNOSIS — I10 Essential (primary) hypertension: Secondary | ICD-10-CM | POA: Diagnosis not present

## 2012-12-27 DIAGNOSIS — E119 Type 2 diabetes mellitus without complications: Secondary | ICD-10-CM | POA: Diagnosis present

## 2012-12-27 DIAGNOSIS — E876 Hypokalemia: Secondary | ICD-10-CM | POA: Diagnosis present

## 2012-12-27 DIAGNOSIS — R1084 Generalized abdominal pain: Secondary | ICD-10-CM | POA: Diagnosis not present

## 2012-12-27 DIAGNOSIS — R079 Chest pain, unspecified: Secondary | ICD-10-CM | POA: Diagnosis not present

## 2012-12-27 DIAGNOSIS — I4729 Other ventricular tachycardia: Secondary | ICD-10-CM

## 2012-12-27 DIAGNOSIS — K219 Gastro-esophageal reflux disease without esophagitis: Secondary | ICD-10-CM | POA: Diagnosis present

## 2012-12-27 DIAGNOSIS — R0600 Dyspnea, unspecified: Secondary | ICD-10-CM

## 2012-12-27 DIAGNOSIS — Z86718 Personal history of other venous thrombosis and embolism: Secondary | ICD-10-CM

## 2012-12-27 DIAGNOSIS — I5023 Acute on chronic systolic (congestive) heart failure: Secondary | ICD-10-CM | POA: Diagnosis not present

## 2012-12-27 DIAGNOSIS — N184 Chronic kidney disease, stage 4 (severe): Secondary | ICD-10-CM | POA: Diagnosis not present

## 2012-12-27 DIAGNOSIS — D638 Anemia in other chronic diseases classified elsewhere: Secondary | ICD-10-CM | POA: Diagnosis present

## 2012-12-27 DIAGNOSIS — I509 Heart failure, unspecified: Secondary | ICD-10-CM | POA: Diagnosis present

## 2012-12-27 DIAGNOSIS — C8293 Follicular lymphoma, unspecified, intra-abdominal lymph nodes: Secondary | ICD-10-CM | POA: Diagnosis not present

## 2012-12-27 DIAGNOSIS — Z79899 Other long term (current) drug therapy: Secondary | ICD-10-CM | POA: Diagnosis not present

## 2012-12-27 DIAGNOSIS — E785 Hyperlipidemia, unspecified: Secondary | ICD-10-CM | POA: Diagnosis not present

## 2012-12-27 DIAGNOSIS — Z794 Long term (current) use of insulin: Secondary | ICD-10-CM

## 2012-12-27 DIAGNOSIS — I82409 Acute embolism and thrombosis of unspecified deep veins of unspecified lower extremity: Secondary | ICD-10-CM | POA: Diagnosis not present

## 2012-12-27 DIAGNOSIS — Z7901 Long term (current) use of anticoagulants: Secondary | ICD-10-CM

## 2012-12-27 DIAGNOSIS — E1129 Type 2 diabetes mellitus with other diabetic kidney complication: Secondary | ICD-10-CM | POA: Diagnosis present

## 2012-12-27 DIAGNOSIS — I129 Hypertensive chronic kidney disease with stage 1 through stage 4 chronic kidney disease, or unspecified chronic kidney disease: Secondary | ICD-10-CM | POA: Diagnosis present

## 2012-12-27 DIAGNOSIS — E669 Obesity, unspecified: Secondary | ICD-10-CM | POA: Diagnosis present

## 2012-12-27 DIAGNOSIS — G4730 Sleep apnea, unspecified: Secondary | ICD-10-CM | POA: Diagnosis present

## 2012-12-27 DIAGNOSIS — IMO0002 Reserved for concepts with insufficient information to code with codable children: Secondary | ICD-10-CM | POA: Diagnosis present

## 2012-12-27 DIAGNOSIS — E11319 Type 2 diabetes mellitus with unspecified diabetic retinopathy without macular edema: Secondary | ICD-10-CM | POA: Diagnosis present

## 2012-12-27 DIAGNOSIS — I452 Bifascicular block: Secondary | ICD-10-CM | POA: Diagnosis present

## 2012-12-27 DIAGNOSIS — R0602 Shortness of breath: Secondary | ICD-10-CM | POA: Diagnosis not present

## 2012-12-27 DIAGNOSIS — E1139 Type 2 diabetes mellitus with other diabetic ophthalmic complication: Secondary | ICD-10-CM | POA: Diagnosis present

## 2012-12-27 DIAGNOSIS — J189 Pneumonia, unspecified organism: Secondary | ICD-10-CM | POA: Diagnosis not present

## 2012-12-27 DIAGNOSIS — I472 Ventricular tachycardia: Secondary | ICD-10-CM

## 2012-12-27 DIAGNOSIS — I739 Peripheral vascular disease, unspecified: Secondary | ICD-10-CM | POA: Diagnosis present

## 2012-12-27 DIAGNOSIS — N058 Unspecified nephritic syndrome with other morphologic changes: Secondary | ICD-10-CM | POA: Diagnosis present

## 2012-12-27 DIAGNOSIS — J90 Pleural effusion, not elsewhere classified: Secondary | ICD-10-CM | POA: Diagnosis not present

## 2012-12-27 DIAGNOSIS — R0989 Other specified symptoms and signs involving the circulatory and respiratory systems: Secondary | ICD-10-CM | POA: Diagnosis not present

## 2012-12-27 DIAGNOSIS — Z6841 Body Mass Index (BMI) 40.0 and over, adult: Secondary | ICD-10-CM | POA: Diagnosis not present

## 2012-12-27 DIAGNOSIS — I059 Rheumatic mitral valve disease, unspecified: Secondary | ICD-10-CM | POA: Diagnosis not present

## 2012-12-27 LAB — BASIC METABOLIC PANEL
BUN: 37 mg/dL — ABNORMAL HIGH (ref 6–23)
CO2: 25 mEq/L (ref 19–32)
Calcium: 8.3 mg/dL — ABNORMAL LOW (ref 8.4–10.5)
Chloride: 99 mEq/L (ref 96–112)
Creatinine, Ser: 3.97 mg/dL — ABNORMAL HIGH (ref 0.50–1.35)
GFR calc Af Amer: 18 mL/min — ABNORMAL LOW (ref >90)
GFR calc non Af Amer: 15 mL/min — ABNORMAL LOW (ref >90)
Glucose, Bld: 326 mg/dL — ABNORMAL HIGH (ref 70–99)
Potassium: 3.3 mEq/L — ABNORMAL LOW (ref 3.5–5.1)
Sodium: 136 mEq/L (ref 135–145)

## 2012-12-27 LAB — CBC
HCT: 28.7 % — ABNORMAL LOW (ref 39.0–52.0)
Hemoglobin: 9.5 g/dL — ABNORMAL LOW (ref 13.0–17.0)
MCH: 25.9 pg — ABNORMAL LOW (ref 26.0–34.0)
MCHC: 33.1 g/dL (ref 30.0–36.0)
MCV: 78.2 fL (ref 78.0–100.0)
Platelets: 207 10*3/uL (ref 150–400)
RBC: 3.67 MIL/uL — ABNORMAL LOW (ref 4.22–5.81)
RDW: 15.3 % (ref 11.5–15.5)
WBC: 4.8 10*3/uL (ref 4.0–10.5)

## 2012-12-27 LAB — GLUCOSE, CAPILLARY: Glucose-Capillary: 322 mg/dL — ABNORMAL HIGH (ref 70–99)

## 2012-12-27 LAB — TROPONIN I
Troponin I: <0.3 ng/mL (ref <0.30)
Troponin I: <0.3 ng/mL (ref <0.30)

## 2012-12-27 LAB — POCT I-STAT TROPONIN I: Troponin i, poc: 0.03 ng/mL (ref 0.00–0.08)

## 2012-12-27 LAB — CG4 I-STAT (LACTIC ACID): Lactic Acid, Venous: 0.95 mmol/L (ref 0.5–2.2)

## 2012-12-27 LAB — PROTIME-INR
INR: 1.74 — ABNORMAL HIGH (ref 0.00–1.49)
Prothrombin Time: 19.8 seconds — ABNORMAL HIGH (ref 11.6–15.2)

## 2012-12-27 LAB — MRSA PCR SCREENING: MRSA by PCR: NEGATIVE

## 2012-12-27 LAB — PRO B NATRIURETIC PEPTIDE: Pro B Natriuretic peptide (BNP): 8369 pg/mL — ABNORMAL HIGH (ref 0–125)

## 2012-12-27 SURGERY — LEFT HEART CATHETERIZATION WITH CORONARY ANGIOGRAM
Anesthesia: LOCAL

## 2012-12-27 MED ORDER — SODIUM CHLORIDE 0.9 % IJ SOLN
3.0000 mL | Freq: Two times a day (BID) | INTRAMUSCULAR | Status: DC
  Administered 2012-12-27 – 2012-12-29 (×4): 3 mL via INTRAVENOUS

## 2012-12-27 MED ORDER — INSULIN GLARGINE 100 UNIT/ML ~~LOC~~ SOLN
60.0000 [IU] | Freq: Every day | SUBCUTANEOUS | Status: DC
  Administered 2012-12-27 – 2012-12-28 (×2): 60 [IU] via SUBCUTANEOUS
  Filled 2012-12-27 (×3): qty 0.6

## 2012-12-27 MED ORDER — ACETAMINOPHEN 325 MG PO TABS: 650.0000 mg | ORAL_TABLET | Freq: Four times a day (QID) | ORAL | Status: DC | PRN

## 2012-12-27 MED ORDER — DOCUSATE SODIUM 100 MG PO CAPS
100.0000 mg | ORAL_CAPSULE | Freq: Two times a day (BID) | ORAL | Status: DC
  Administered 2012-12-27 – 2012-12-29 (×4): 100 mg via ORAL
  Filled 2012-12-27 (×5): qty 1

## 2012-12-27 MED ORDER — INSULIN ASPART 100 UNIT/ML ~~LOC~~ SOLN
0.0000 [IU] | Freq: Three times a day (TID) | SUBCUTANEOUS | Status: DC
  Administered 2012-12-28: 2 [IU] via SUBCUTANEOUS
  Administered 2012-12-28 – 2012-12-29 (×3): 1 [IU] via SUBCUTANEOUS

## 2012-12-27 MED ORDER — WARFARIN - PHARMACIST DOSING INPATIENT: Freq: Every day | Status: DC

## 2012-12-27 MED ORDER — SODIUM CHLORIDE 0.9 % IJ SOLN: 3.0000 mL | Freq: Two times a day (BID) | INTRAMUSCULAR | Status: DC

## 2012-12-27 MED ORDER — HEPARIN SODIUM (PORCINE) 5000 UNIT/ML IJ SOLN
4000.0000 [IU] | Freq: Once | INTRAMUSCULAR | Status: AC
  Administered 2012-12-27: 4000 [IU] via INTRAVENOUS

## 2012-12-27 MED ORDER — POTASSIUM CHLORIDE CRYS ER 20 MEQ PO TBCR
20.0000 meq | EXTENDED_RELEASE_TABLET | Freq: Every day | ORAL | Status: DC
  Administered 2012-12-28 – 2012-12-29 (×2): 20 meq via ORAL
  Filled 2012-12-27: qty 1
  Filled 2012-12-27: qty 2
  Filled 2012-12-27: qty 1

## 2012-12-27 MED ORDER — ZOLPIDEM TARTRATE 5 MG PO TABS: 5.0000 mg | ORAL_TABLET | Freq: Every evening | ORAL | Status: DC | PRN

## 2012-12-27 MED ORDER — ASPIRIN 81 MG PO CHEW
CHEWABLE_TABLET | ORAL | Status: AC
  Filled 2012-12-27: qty 4

## 2012-12-27 MED ORDER — MORPHINE SULFATE 4 MG/ML IJ SOLN
4.0000 mg | Freq: Once | INTRAMUSCULAR | Status: AC
  Administered 2012-12-27: 4 mg via INTRAVENOUS
  Filled 2012-12-27: qty 1

## 2012-12-27 MED ORDER — SODIUM CHLORIDE 0.9 % IV SOLN: 250.0000 mL | INTRAVENOUS | Status: DC | PRN

## 2012-12-27 MED ORDER — POLYSACCHARIDE IRON COMPLEX 150 MG PO CAPS
150.0000 mg | ORAL_CAPSULE | Freq: Every day | ORAL | Status: DC
  Administered 2012-12-28 – 2012-12-29 (×2): 150 mg via ORAL
  Filled 2012-12-27 (×2): qty 1

## 2012-12-27 MED ORDER — POTASSIUM CHLORIDE CRYS ER 20 MEQ PO TBCR
40.0000 meq | EXTENDED_RELEASE_TABLET | Freq: Once | ORAL | Status: AC
  Administered 2012-12-27: 40 meq via ORAL

## 2012-12-27 MED ORDER — SODIUM CHLORIDE 0.9 % IJ SOLN
3.0000 mL | INTRAMUSCULAR | Status: DC | PRN
  Administered 2012-12-29: 3 mL via INTRAVENOUS

## 2012-12-27 MED ORDER — NITROGLYCERIN IN D5W 200-5 MCG/ML-% IV SOLN
2.0000 ug/min | Freq: Once | INTRAVENOUS | Status: AC
  Administered 2012-12-27: 5 ug/min via INTRAVENOUS

## 2012-12-27 MED ORDER — NITROGLYCERIN IN D5W 200-5 MCG/ML-% IV SOLN
INTRAVENOUS | Status: AC
  Filled 2012-12-27: qty 250

## 2012-12-27 MED ORDER — CALCITRIOL 0.25 MCG PO CAPS
0.2500 ug | ORAL_CAPSULE | Freq: Every day | ORAL | Status: DC
  Administered 2012-12-28 – 2012-12-29 (×2): 0.25 ug via ORAL
  Filled 2012-12-27 (×2): qty 1

## 2012-12-27 MED ORDER — DEXTROSE 5 % IV SOLN
1.0000 g | Freq: Once | INTRAVENOUS | Status: AC
  Administered 2012-12-27: 1 g via INTRAVENOUS
  Filled 2012-12-27: qty 10

## 2012-12-27 MED ORDER — FUROSEMIDE 80 MG PO TABS
80.0000 mg | ORAL_TABLET | Freq: Two times a day (BID) | ORAL | Status: DC
  Administered 2012-12-27 – 2012-12-29 (×5): 80 mg via ORAL
  Filled 2012-12-27 (×6): qty 1

## 2012-12-27 MED ORDER — HEPARIN SODIUM (PORCINE) 5000 UNIT/ML IJ SOLN
INTRAMUSCULAR | Status: AC
  Filled 2012-12-27: qty 1

## 2012-12-27 MED ORDER — ASPIRIN 81 MG PO CHEW
324.0000 mg | CHEWABLE_TABLET | Freq: Once | ORAL | Status: AC
  Administered 2012-12-27: 324 mg via ORAL

## 2012-12-27 MED ORDER — METHOCARBAMOL 500 MG PO TABS
500.0000 mg | ORAL_TABLET | Freq: Two times a day (BID) | ORAL | Status: DC | PRN
Start: 2012-12-27 — End: 2012-12-29
  Filled 2012-12-27: qty 1

## 2012-12-27 MED ORDER — ONDANSETRON HCL 4 MG PO TABS: 4.0000 mg | ORAL_TABLET | Freq: Four times a day (QID) | ORAL | Status: DC | PRN

## 2012-12-27 MED ORDER — TRAMADOL HCL 50 MG PO TABS
50.0000 mg | ORAL_TABLET | Freq: Four times a day (QID) | ORAL | Status: DC | PRN
  Administered 2012-12-28: 50 mg via ORAL
  Filled 2012-12-27: qty 1

## 2012-12-27 MED ORDER — DEXTROSE 5 % IV SOLN
500.0000 mg | Freq: Once | INTRAVENOUS | Status: AC
  Administered 2012-12-27: 500 mg via INTRAVENOUS
  Filled 2012-12-27: qty 500

## 2012-12-27 MED ORDER — CARVEDILOL 25 MG PO TABS
25.0000 mg | ORAL_TABLET | Freq: Two times a day (BID) | ORAL | Status: DC
  Administered 2012-12-27 – 2012-12-29 (×5): 25 mg via ORAL
  Filled 2012-12-27 (×6): qty 1

## 2012-12-27 MED ORDER — ONDANSETRON HCL 4 MG/2ML IJ SOLN: 4.0000 mg | Freq: Four times a day (QID) | INTRAMUSCULAR | Status: DC | PRN

## 2012-12-27 MED ORDER — ACETAMINOPHEN 650 MG RE SUPP: 650.0000 mg | Freq: Four times a day (QID) | RECTAL | Status: DC | PRN

## 2012-12-27 MED ORDER — AMLODIPINE BESYLATE 10 MG PO TABS
10.0000 mg | ORAL_TABLET | Freq: Every day | ORAL | Status: DC
  Administered 2012-12-27 – 2012-12-28 (×2): 10 mg via ORAL
  Filled 2012-12-27 (×3): qty 1

## 2012-12-27 NOTE — H&P (Signed)
PCP:   Geoffery Lyons, MD   Chief Complaint:  Shortness of breath, chest pain  HPI: Patient is a 62 year old African American male with a history of LE DVT with placement of IVC filter 2 months ago, Coumadin therapy and Lymphoma as well as DM2.  Came to the [place] with a complaint of chest pain beginning last night which started with a dry cough several days prior.  Sputum is productive with some blood noted.  Was put on IV NTG in ER and evaluated by Cardiology.  They do not believe he is code STEMI as initially called, suggest cycling troponins and requested Internal Medicine admit the patient.  He state compliance with meds, but has not had his INR checked in our office since early June and not seen for an interim visit.  His INR is low today, his kidney function is worsened, his blood glucose is high and his potassium is 3.3.  He still notes the pain in his chest which has improved since his arrival.  Review of Systems:  Review of Systems - Negative except chest pain, cough with productive sputum and mild SOB, reviewed on 12 point scale. Past Medical History: Past Medical History  Diagnosis Date  . Cancer   . Diabetes mellitus   . DVT (deep venous thrombosis)     Right leg  . Diabetic retinopathy   . Hypertension   . Hyperlipidemia   . Non Hodgkin's lymphoma     Tx 2009  . History of cardiac catheterization     pt denies this on 03/15/2012  . Nodular lymphoma of intra-abdominal lymph nodes   . Shortness of breath   . Sleep apnea     "suppose to have a sleep studfy, but they never told me when.  . Peripheral vascular disease   . Blood transfusion   . 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  Marland Kitchen Headache(784.0)   . Noncompliance 03/16/2012  . CKD (chronic kidney disease) stage 4, GFR 15-29 ml/min 03/18/2012  . NSVT (nonsustained ventricular tachycardia) 03/18/2012  . Family history of anesthesia complication     " my son wakes up slowly"  . Pneumonia     hosp.-  2013  . Anemia   . GERD (gastroesophageal reflux disease)     uses alka seltzere on occas.   . Arthritis     HNP- lumbar   Past Surgical History  Procedure Laterality Date  . Porta catheter    . Porta catheter insertion      later removed  . Porta catheter removed    . Coloscopy    . Pars plana vitrectomy  08/27/2011    Procedure: PARS PLANA VITRECTOMY WITH 25 GAUGE;  Surgeon: Hayden Pedro, MD;  Location: [place];  Service: Ophthalmology;  Laterality: Left;  Repair of complex traction retinal detachment left eye  . Cardiac catheterization      pt denies this on 03/15/2012  . Eye surgery    . Viterectomy  05/10/2012    od  . Pars plana vitrectomy  05/10/2012    Procedure: PARS PLANA VITRECTOMY WITH 25 GAUGE;  Surgeon: Hayden Pedro, MD;  Location: [place];  Service: Ophthalmology;  Laterality: Right;  Repair Complex Traction Retinal Detachment  . Membrane peel  05/10/2012    Procedure: MEMBRANE PEEL;  Surgeon: Hayden Pedro, MD;  Location: [place];  Service: Ophthalmology;  Laterality: Right;  . Photocoagulation with laser  05/10/2012  Procedure: PHOTOCOAGULATION WITH LASER;  Surgeon: Hayden Pedro, MD;  Location: [place];  Service: Ophthalmology;  Laterality: Right;  . Gas insertion  05/10/2012    Procedure: INSERTION OF GAS;  Surgeon: Hayden Pedro, MD;  Location: [place];  Service: Ophthalmology;  Laterality: Right;  . Ivc filter  09/2012    due to preparation for surgery    Medications: Prior to Admission medications   Medication Sig Start Date End Date Taking? Authorizing Provider  amLODipine (NORVASC) 10 MG tablet Take 1 tablet (10 mg total) by mouth at bedtime. 12/05/12  Yes Lorretta Harp, MD  calcitRIOL (ROCALTROL) 0.25 MCG capsule Take 0.25 mcg by mouth daily.     Yes Historical Provider, MD  carvedilol (COREG) 25 MG tablet Take 25 mg by mouth 2 (two) times daily with a meal.   Yes Historical Provider, MD  furosemide (LASIX) 80 MG tablet Take 80 mg by mouth 2 (two)  times daily.   Yes Historical Provider, MD  insulin glargine (LANTUS) 100 UNIT/ML injection Inject 60 Units into the skin at bedtime.   Yes Historical Provider, MD  insulin lispro (HUMALOG) 100 UNIT/ML injection Inject 10 Units into the skin 2 (two) times daily.   Yes Historical Provider, MD  KLOR-CON M20 20 MEQ tablet Take 20 mg by mouth Daily. 04/11/12  Yes Historical Provider, MD  methocarbamol (ROBAXIN) 500 MG tablet Take 500 mg by mouth 2 (two) times daily as needed (for muscle spasms).   Yes Historical Provider, MD  polysaccharide iron (NIFEREX) 150 MG CAPS capsule Take 1 capsule (150 mg total) by mouth daily. 01/16/11  Yes Estela Leonie Green, MD  traMADol (ULTRAM) 50 MG tablet Take 50 mg by mouth every 6 (six) hours as needed for pain.   Yes Historical Provider, MD  warfarin (COUMADIN) 5 MG tablet Take 5-10 mg by mouth every evening. Pt takes 10 mg on sun wed thu sat and 5 mg all other days   Yes Historical Provider, MD    Allergies:  No Known Allergies  Social History:  reports that he has never smoked. He has never used smokeless tobacco. He reports that he does not drink alcohol or use illicit drugs.  Family History: Family History  Problem Relation Age of Onset  . Anesthesia problems Son     Physical Exam: Filed Vitals:   12/27/12 1601 12/27/12 1603  BP: 154/77 152/79  Pulse: 79 82  Temp:  97.8 F (36.6 C)  TempSrc:  Oral  Resp:  16  SpO2: 91% 93%   General appearance: alert, cooperative and appears stated age Head: Normocephalic, without obvious abnormality, atraumatic Eyes: conjunctivae/corneas clear. PERRL, EOM's intact.  Nose: Nares normal. Septum midline. Mucosa normal. No drainage or sinus tenderness. Throat: lips, mucosa, and tongue normal; teeth and gums normal Neck: no adenopathy, no carotid bruit, no JVD and thyroid not enlarged, symmetric, no tenderness/mass/nodules Resp: clear to auscultation bilaterally except for crackles at bases Cardio: regular  rate and rhythm, S1, S2 normal, no murmur, click, rub or gallop GI: soft, non-tender; bowel sounds normal; no masses,  no organomegaly Extremities: 2+ edema bilaterally Pulses: 2+ and symmetric Lymph nodes: Cervical adenopathy: no cervical lymphadenopathy Neurologic: Alert and oriented X 3, normal strength and tone. Normal symmetric reflexes.     Labs on Admission:   Recent Labs  12/27/12 1600  NA 136  K 3.3*  CL 99  CO2 25  GLUCOSE 326*  BUN 37*  CREATININE 3.97*  CALCIUM 8.3*  Recent Labs  12/27/12 1600  WBC 4.8  HGB 9.5*  HCT 28.7*  MCV 78.2  PLT 207    Recent Labs  12/27/12 1712  TROPONINI <0.30   Lab Results  Component Value Date   INR 1.74* 12/27/2012   INR 1.11 11/09/2012   INR 1.14 11/01/2012    Radiological Exams on Admission: Dg Chest Port 1 View  12/27/2012   *RADIOLOGY REPORT*  Clinical Data: Chest pain/pressure with shortness of breath and hemoptysis.  History of diabetes and hypertension.  PORTABLE CHEST - 1 VIEW  Comparison: 11/01/2012 and 03/15/2012.  Findings: 1614 hours.  Cardiomegaly and vascular congestion are stable.  There is new asymmetric air space disease in the right upper lobe.  There is probable patchy opacity at the right lung base.  The left lung is clear.  There is no pleural effusion or pneumothorax.  IMPRESSION: New asymmetric right lung air space opacities may reflect asymmetric edema, aspirated blood or pneumonia.  Radiographic followup recommended.   Original Report Authenticated By: Richardean Sale, [name]   Orders placed during the hospital encounter of 12/27/12  . EKG 12-LEAD  . EKG 12-LEAD  . ED EKG  . ED EKG    Assessment/Plan Principal Problem:   Chest pain with low risk for cardiac etiology.  Cards following, enzymes cycled.  They will manage this portion of his care in consultation. Active Problems:   DIABETES MELLITUS-TYPE II  Continue insulin with SSI, A1C ordered   History of DVT of lower extremity  Has filter, CT  pending to r/o PE.  I suspect most of this is fluid overload   Hypertension  Stable   CKD (chronic kidney disease) stage 4, GFR 15-29 ml/min  Creatinine is 1 point above his prior, but he has an elevated BNP and is showing signs of CHF.  Will continue Lasix, Cards to manage, Echo Pending for AM.  I suspect some of this may be related to med compliance.   Chronic anticoagulation- With filter in place, not certain why this was maintained, perhaps Cards has more insight, as his last EF is not low and not in Afib.  Given his current anemia, needs to be considered. Anemia- Check Iron, B12, folate in AM, May be renally related, but will guaiac stools as well. Hypokalemia- Actually below 3.3 as his Glucose was in the 300's at this time.  Will give oral replacement with next meal as not done in ER. CAP- Abx started in ER are Rocephin, Azithromycin.  CT may help to guide therapy as well.  ? Aspiration vs what is being seen is all CHF.   Doren Kaspar W 12/27/2012, 5:53 PM

## 2012-12-27 NOTE — Progress Notes (Addendum)
ANTICOAGULATION CONSULT NOTE - Initial Consult  Pharmacy Consult for coumadin Indication: hx DVT  No Known Allergies  Vital Signs: Temp: 97.8 F (36.6 C) (07/29 1603) Temp src: Oral (07/29 1603) BP: 152/79 mmHg (07/29 1603) Pulse Rate: 82 (07/29 1603)  Labs:  Recent Labs  12/27/12 1600 12/27/12 1655  HGB 9.5*  --   HCT 28.7*  --   PLT 207  --   LABPROT  --  19.8*  INR  --  1.74*  CREATININE 3.97*  --     The CrCl is unknown because both a height and weight (above a minimum accepted value) are required for this calculation.   Medical History: Past Medical History  Diagnosis Date  . Cancer   . Diabetes mellitus   . DVT (deep venous thrombosis)     Right leg  . Diabetic retinopathy   . Hypertension   . Hyperlipidemia   . Non Hodgkin's lymphoma     Tx 2009  . History of cardiac catheterization     pt denies this on 03/15/2012  . Nodular lymphoma of intra-abdominal lymph nodes   . Shortness of breath   . Sleep apnea     "suppose to have a sleep studfy, but they never told me when.  . Peripheral vascular disease   . Blood transfusion   . 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  Marland Kitchen Headache(784.0)   . Noncompliance 03/16/2012  . CKD (chronic kidney disease) stage 4, GFR 15-29 ml/min 03/18/2012  . NSVT (nonsustained ventricular tachycardia) 03/18/2012  . Family history of anesthesia complication     " my son wakes up slowly"  . Pneumonia     hosp.- 2013  . Anemia   . GERD (gastroesophageal reflux disease)     uses alka seltzere on occas.   . Arthritis     HNP- lumbar    Medications:  See med rec  Assessment: Patient is a 62 y.o M on coumadin PTA for hx DVT (s/p IVC filter) presented to the ED with c/o of chest pain.  RBBB noted on EKG.  Patient reported some hemoptysis today (MD is aware).  INR is subtherapeutic at 1.74. Per Patient, home coumadin regimen is 10mg  daily except 5mg  on Mondays and Fridays with dose taken this morning prior  coming to the ED.  Goal of Therapy:  INR 2-3    Plan:  1) Will not give dose tonight as patient already took dose this morning 2) f/u with AM labs  3) monitor closely for s/s bleeding  Latravis Grine P 12/27/2012,5:46 PM

## 2012-12-27 NOTE — ED Notes (Signed)
Pt c/o non radiating Left side chest pain, SOB, coughing up blood, and epigastric pain x1 day. Pt denies N/V/D, back/arm/shoulder pain, dizziness, or diaphoresis

## 2012-12-27 NOTE — ED Notes (Signed)
Pt brought back to room from triage via wheelchair; pt undressed, in gown, on monitor, continuous pulse oximetry, blood pressure cuff and oxygen [place] (3L); EKG was performed at triage and Linker, MD was given copy of EKG by Tonia Ghent, NT

## 2012-12-27 NOTE — Consult Note (Signed)
Reason for Consult: Chest Pain Referring Physician: ER MD   HPI: The patient is a 62 y/o obese AAM, with a history of HTN, DM, diabetic kidney disease and past history of lower extremity DVT, on chronic coumadin therapy, and lymphoma, who presented to the [facility] ER today with a complaint of chest pain. He was seen in the hospital in October 2013 for evaluation of chest pain. This was felt to be musculoskeletal. He underwent a NST that was low risk for ischemia. His most recent echo revealed normal systolic function with an EF of 55-60%. He states that his pain first began last night. Prior to the start of his pain he had a dry cough x 3 days that progressed into a productive cough with yellow colored sputum mixed with bright red blood. His chest pain is localized in the center of his chest and does not radiate. It is a dull ache that is exacerbated by coughing and is mildly pleuritic. It does not change with exertion. He notes associated SOB. No diaphoresis, n/v, syncope or presyncope. He also note mid epigastic pain and tenderness. In the ED, his EKG demonstrates a RBBB, but no acute changes. He is currently on IV NTG but notes no improvement in his pain. He continues to endorse 5/10 discomfort. He reports compliance with coumadin and denies significant LEE pain or increased swelling.   Past Medical History  Diagnosis Date  . Cancer   . Diabetes mellitus   . DVT (deep venous thrombosis)     Right leg  . Diabetic retinopathy   . Hypertension   . Hyperlipidemia   . Non Hodgkin's lymphoma     Tx 2009  . History of cardiac catheterization     pt denies this on 03/15/2012  . Nodular lymphoma of intra-abdominal lymph nodes   . Shortness of breath   . Sleep apnea     "suppose to have a sleep studfy, but they never told me when.  . Peripheral vascular disease   . Blood transfusion   . 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  Marland Kitchen Headache(784.0)   . Noncompliance 03/16/2012  . CKD  (chronic kidney disease) stage 4, GFR 15-29 ml/min 03/18/2012  . NSVT (nonsustained ventricular tachycardia) 03/18/2012  . Family history of anesthesia complication     " my son wakes up slowly"  . Pneumonia     hosp.- 2013  . Anemia   . GERD (gastroesophageal reflux disease)     uses alka seltzere on occas.   . Arthritis     HNP- lumbar    Past Surgical History  Procedure Laterality Date  . Porta catheter    . Porta catheter insertion      later removed  . Porta catheter removed    . Coloscopy    . Pars plana vitrectomy  08/27/2011    Procedure: PARS PLANA VITRECTOMY WITH 25 GAUGE;  Surgeon: Hayden Pedro, MD;  Location: [place];  Service: Ophthalmology;  Laterality: Left;  Repair of complex traction retinal detachment left eye  . Cardiac catheterization      pt denies this on 03/15/2012  . Eye surgery    . Viterectomy  05/10/2012    od  . Pars plana vitrectomy  05/10/2012    Procedure: PARS PLANA VITRECTOMY WITH 25 GAUGE;  Surgeon: Hayden Pedro, MD;  Location: [place];  Service: Ophthalmology;  Laterality: Right;  Repair Complex Traction Retinal Detachment  . Membrane peel  05/10/2012  Procedure: MEMBRANE PEEL;  Surgeon: Hayden Pedro, MD;  Location: [place];  Service: Ophthalmology;  Laterality: Right;  . Photocoagulation with laser  05/10/2012    Procedure: PHOTOCOAGULATION WITH LASER;  Surgeon: Hayden Pedro, MD;  Location: [place];  Service: Ophthalmology;  Laterality: Right;  . Gas insertion  05/10/2012    Procedure: INSERTION OF GAS;  Surgeon: Hayden Pedro, MD;  Location: [place];  Service: Ophthalmology;  Laterality: Right;  . Ivc filter  09/2012    due to preparation for surgery    Family History  Problem Relation Age of Onset  . Anesthesia problems Son     Social History:  reports that he has never smoked. He has never used smokeless tobacco. He reports that he does not drink alcohol or use illicit drugs.  Allergies: No Known Allergies  Medications: Prior  to Admission medications   Medication Sig Start Date End Date Taking? Authorizing Provider  acetaminophen (TYLENOL) 500 MG tablet Take 1,000 mg by mouth every 6 (six) hours as needed. For headache     Historical Provider, MD  amLODipine (NORVASC) 10 MG tablet Take 1 tablet (10 mg total) by mouth at bedtime. 12/05/12   Lorretta Harp, MD  calcitRIOL (ROCALTROL) 0.25 MCG capsule Take 0.25 mcg by mouth daily.      Historical Provider, MD  carvedilol (COREG) 25 MG tablet Take 25 mg by mouth 2 (two) times daily with a meal.    Historical Provider, MD  furosemide (LASIX) 80 MG tablet Take 80 mg by mouth 2 (two) times daily.    Historical Provider, MD  insulin glargine (LANTUS) 100 UNIT/ML injection Inject 50 Units into the skin at bedtime. 05/25/11 10/28/12  Geoffery Lyons, MD  insulin lispro (HUMALOG) 100 UNIT/ML injection Inject 60 Units into the skin 2 (two) times daily before a meal.    Historical Provider, MD  KLOR-CON M20 20 MEQ tablet Take 20 mg by mouth Daily. 04/11/12   Historical Provider, MD  methocarbamol (ROBAXIN) 500 MG tablet Take 500 mg by mouth 2 (two) times daily as needed (for muscle spasms).    Historical Provider, MD  polysaccharide iron (NIFEREX) 150 MG CAPS capsule Take 1 capsule (150 mg total) by mouth daily. 01/16/11   Erline Hau, MD  sodium-potassium bicarbonate (ALKA-SELTZER GOLD) TBEF Take 1 tablet by mouth daily as needed.    Historical Provider, MD  traMADol (ULTRAM) 50 MG tablet Take 50 mg by mouth every 6 (six) hours as needed for pain.    Historical Provider, MD  warfarin (COUMADIN) 5 MG tablet Take 5-10 mg by mouth daily. 5mg . M&F, 10 mg. Every other day of the week    Historical Provider, MD     No results found for this or any previous visit (from the past 48 hour(s)).  No results found.  Review of Systems  Respiratory: Positive for cough, hemoptysis, sputum production and shortness of breath.   Cardiovascular: Positive for chest pain.   Gastrointestinal: Positive for abdominal pain (mid epigastric).  Neurological: Negative for dizziness and loss of consciousness.  All other systems reviewed and are negative.   Blood pressure 152/79, pulse 82, temperature 97.8 F (36.6 C), temperature source Oral, resp. rate 16, SpO2 93.00%. Physical Exam  Constitutional: He is oriented to person, place, and time. He appears well-developed and well-nourished. No distress.  Neck: No JVD present. Carotid bruit is not present.  Cardiovascular: Normal rate, regular rhythm and intact distal pulses.  Exam reveals no  gallop and no friction rub.   No murmur heard. Pulses:      Radial pulses are 2+ on the right side, and 2+ on the left side.       Dorsalis pedis pulses are 2+ on the right side, and 2+ on the left side.  Respiratory: Effort normal and breath sounds normal. No respiratory distress. He has no wheezes. He has no rales.  GI: Soft. Bowel sounds are normal. He exhibits no distension and no mass. There is tenderness in the epigastric area.  Musculoskeletal: He exhibits edema (bilateral LEE).  Neurological: He is alert and oriented to person, place, and time.  Skin: Skin is warm and dry. He is not diaphoretic.  Psychiatric: He has a normal mood and affect. His behavior is normal.    Assessment/Plan: Principal Problem:   Chest pain with low risk for cardiac etiology Active Problems:   DIABETES MELLITUS-TYPE II   History of DVT of lower extremity   Hypertension   CKD (chronic kidney disease) stage 4, GFR 15-29 ml/min   Chronic anticoagulation  Plan: Code STEMI was canceled. Dr. Gwenlyn Found reviewed his EKG, which revealed RBBB and no acute changes. Pt is endorsing atypical chest pain, mid epigastric tenderness and productive cough with ? Hemoptysis. He had a NST late 2013 that was low risk and normal systolic function. He does have a history of DVT and is on chronic coumadin therapy. We have recommened to ED staff to consult [facility] for  evaluation and admission. At this point, we feel that is chest pain may be noncardiac. However we will cycle troponins to rule out MI. CXR is pending. Consider obtaining a d-Dimmer to rule out PE. Check INR to see if he is therapeutic. MD to follow with further recommendation.  Consuelo Pandy, PA-C 12/27/2012, 4:15 PM     Agree with note written by Ellen Henri  PAC  Recent neg MPI. On coumadin AC with therapeutic INR.Atyp CP. RBBB. C/O cough and hemoptysis. CP is pleuritic. Exam benign. Labs OK. Will get TR[facility] to admit. Chest CT. Cysle enzymes. Will follow with you.   Lorretta Harp 12/27/2012 5:33 PM

## 2012-12-27 NOTE — ED Provider Notes (Signed)
CSN: [id number]     Arrival date & time 12/27/12  1544 History     First MD Initiated Contact with Patient 12/27/12 1615     Chief Complaint  Patient presents with  . Chest Pain  . Shortness of Breath   (Consider location/radiation/quality/duration/timing/severity/associated sxs/prior Treatment) HPI Comments: Pt w/ extensive PMHx significant for CHF w/ preserved EF 55%, lymphoma, PE on coumadin w/ IVC filter. Now w/ cough x 3 days. Dry cough a/w dyspnea. No fever, no recent travel, sick contact or hospitalizations. Last pm noted small volume hemoptysis. Today developed constant substernal chest pressure - pleuritic. Not exertional, non radiating. Constant. No hx of CAD, negative stress test 1 yr ago. No n/v/d/c, no dizziness or diaphoresis.  Patient is a 62 y.o. male presenting with general illness. The history is provided by the patient. No language interpreter was used.  Illness Location:  Cardio/pulm Quality:  Cough, dyspnea, chest pain Severity:  Moderate Onset quality:  Gradual Duration:  3 days Timing:  Constant Progression:  Worsening Chronicity:  New Associated symptoms: chest pain, cough and shortness of breath   Associated symptoms: no abdominal pain, no congestion, no diarrhea, no fever, no headaches, no nausea, no rash, no sore throat and no vomiting     Past Medical History  Diagnosis Date  . Cancer   . Diabetes mellitus   . DVT (deep venous thrombosis)     Right leg  . Diabetic retinopathy   . Hypertension   . Hyperlipidemia   . Non Hodgkin's lymphoma     Tx 2009  . History of cardiac catheterization     pt denies this on 03/15/2012  . Nodular lymphoma of intra-abdominal lymph nodes   . Shortness of breath   . Sleep apnea     "suppose to have a sleep studfy, but they never told me when.  . Peripheral vascular disease   . Blood transfusion   . 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  Marland Kitchen Headache(784.0)   . Noncompliance 03/16/2012  . CKD  (chronic kidney disease) stage 4, GFR 15-29 ml/min 03/18/2012  . NSVT (nonsustained ventricular tachycardia) 03/18/2012  . Family history of anesthesia complication     " my son wakes up slowly"  . Pneumonia     hosp.- 2013  . Anemia   . GERD (gastroesophageal reflux disease)     uses alka seltzere on occas.   . Arthritis     HNP- lumbar   Past Surgical History  Procedure Laterality Date  . Porta catheter    . Porta catheter insertion      later removed  . Porta catheter removed    . Coloscopy    . Pars plana vitrectomy  08/27/2011    Procedure: PARS PLANA VITRECTOMY WITH 25 GAUGE;  Surgeon: Hayden Pedro, MD;  Location: [place];  Service: Ophthalmology;  Laterality: Left;  Repair of complex traction retinal detachment left eye  . Cardiac catheterization      pt denies this on 03/15/2012  . Eye surgery    . Viterectomy  05/10/2012    od  . Pars plana vitrectomy  05/10/2012    Procedure: PARS PLANA VITRECTOMY WITH 25 GAUGE;  Surgeon: Hayden Pedro, MD;  Location: [place];  Service: Ophthalmology;  Laterality: Right;  Repair Complex Traction Retinal Detachment  . Membrane peel  05/10/2012    Procedure: MEMBRANE PEEL;  Surgeon: Hayden Pedro, MD;  Location: [place];  Service: Ophthalmology;  Laterality: Right;  . Photocoagulation with laser  05/10/2012    Procedure: PHOTOCOAGULATION WITH LASER;  Surgeon: Hayden Pedro, MD;  Location: [place];  Service: Ophthalmology;  Laterality: Right;  . Gas insertion  05/10/2012    Procedure: INSERTION OF GAS;  Surgeon: Hayden Pedro, MD;  Location: [place];  Service: Ophthalmology;  Laterality: Right;  . Ivc filter  09/2012    due to preparation for surgery   Family History  Problem Relation Age of Onset  . Anesthesia problems Son    History  Substance Use Topics  . Smoking status: Never Smoker   . Smokeless tobacco: Never Used  . Alcohol Use: No    Review of Systems  Constitutional: Negative for fever and chills.  HENT: Negative for  congestion and sore throat.   Respiratory: Positive for cough and shortness of breath.   Cardiovascular: Positive for chest pain. Negative for leg swelling.  Gastrointestinal: Negative for nausea, vomiting, abdominal pain, diarrhea and constipation.  Genitourinary: Negative for dysuria and frequency.  Skin: Negative for color change and rash.  Neurological: Negative for dizziness and headaches.  Psychiatric/Behavioral: Negative for confusion and agitation.  All other systems reviewed and are negative.    Allergies  Review of patient's allergies indicates no known allergies.  Home Medications   Current Outpatient Rx  Name  Route  Sig  Dispense  Refill  . acetaminophen (TYLENOL) 500 MG tablet   Oral   Take 1,000 mg by mouth every 6 (six) hours as needed. For headache          . amLODipine (NORVASC) 10 MG tablet   Oral   Take 1 tablet (10 mg total) by mouth at bedtime.   30 tablet   6   . calcitRIOL (ROCALTROL) 0.25 MCG capsule   Oral   Take 0.25 mcg by mouth daily.           . carvedilol (COREG) 25 MG tablet   Oral   Take 25 mg by mouth 2 (two) times daily with a meal.         . furosemide (LASIX) 80 MG tablet   Oral   Take 80 mg by mouth 2 (two) times daily.         Marland Kitchen EXPIRED: insulin glargine (LANTUS) 100 UNIT/ML injection   Subcutaneous   Inject 50 Units into the skin at bedtime.   10 mL   12   . insulin lispro (HUMALOG) 100 UNIT/ML injection   Subcutaneous   Inject 60 Units into the skin 2 (two) times daily before a meal.         . KLOR-CON M20 20 MEQ tablet   Oral   Take 20 mg by mouth Daily.         . methocarbamol (ROBAXIN) 500 MG tablet   Oral   Take 500 mg by mouth 2 (two) times daily as needed (for muscle spasms).         . polysaccharide iron (NIFEREX) 150 MG CAPS capsule   Oral   Take 1 capsule (150 mg total) by mouth daily.   30 each   2   . sodium-potassium bicarbonate (ALKA-SELTZER GOLD) TBEF   Oral   Take 1 tablet by mouth  daily as needed.         . traMADol (ULTRAM) 50 MG tablet   Oral   Take 50 mg by mouth every 6 (six) hours as needed for pain.         Marland Kitchen  warfarin (COUMADIN) 5 MG tablet   Oral   Take 5-10 mg by mouth daily. 5mg . M&F, 10 mg. Every other day of the week          BP 152/79  Pulse 82  Temp(Src) 97.8 F (36.6 C) (Oral)  Resp 16  SpO2 93% Physical Exam  Constitutional: He is oriented to person, place, and time. He appears well-developed and well-nourished. No distress.  HENT:  Head: Normocephalic and atraumatic.  Eyes: EOM are normal. Pupils are equal, round, and reactive to light.  Neck: Normal range of motion. Neck supple.  Cardiovascular: Normal rate and regular rhythm.   Pulses:      Radial pulses are 2+ on the right side, and 2+ on the left side.  Pulmonary/Chest: Effort normal. No respiratory distress. He has decreased breath sounds.  Abdominal: Soft. He exhibits no distension. There is no tenderness.  Musculoskeletal: Normal range of motion. He exhibits no edema.       Right lower leg: He exhibits edema.       Left lower leg: He exhibits edema.  Neurological: He is alert and oriented to person, place, and time.  Skin: Skin is warm and dry.  Psychiatric: He has a normal mood and affect. His behavior is normal.    ED Course   Procedures (including critical care time)  Results for orders placed during the hospital encounter of 12/27/12  MRSA PCR SCREENING      Result Value Range   MRSA by PCR NEGATIVE  NEGATIVE  CBC      Result Value Range   WBC 4.8  4.0 - 10.5 K/uL   RBC 3.67 (*) 4.22 - 5.81 MIL/uL   Hemoglobin 9.5 (*) 13.0 - 17.0 g/dL   HCT 28.7 (*) 39.0 - 52.0 %   MCV 78.2  78.0 - 100.0 fL   MCH 25.9 (*) 26.0 - 34.0 pg   MCHC 33.1  30.0 - 36.0 g/dL   RDW 15.3  11.5 - 15.5 %   Platelets 207  150 - 400 K/uL  BASIC METABOLIC PANEL      Result Value Range   Sodium 136  135 - 145 mEq/L   Potassium 3.3 (*) 3.5 - 5.1 mEq/L   Chloride 99  96 - 112 mEq/L    CO2 25  19 - 32 mEq/L   Glucose, Bld 326 (*) 70 - 99 mg/dL   BUN 37 (*) 6 - 23 mg/dL   Creatinine, Ser 3.97 (*) 0.50 - 1.35 mg/dL   Calcium 8.3 (*) 8.4 - 10.5 mg/dL   GFR calc non Af Amer 15 (*) >90 mL/min   GFR calc Af Amer 18 (*) >90 mL/min  PRO B NATRIURETIC PEPTIDE      Result Value Range   Pro B Natriuretic peptide (BNP) 8369.0 (*) 0 - 125 pg/mL  PROTIME-INR      Result Value Range   Prothrombin Time 19.8 (*) 11.6 - 15.2 seconds   INR 1.74 (*) 0.00 - 1.49  TROPONIN I      Result Value Range   Troponin I <0.30  <0.30 ng/mL  TROPONIN I      Result Value Range   Troponin I <0.30  <0.30 ng/mL  GLUCOSE, CAPILLARY      Result Value Range   Glucose-Capillary 322 (*) 70 - 99 mg/dL   Comment 1 Notify RN    POCT I-STAT TROPONIN I      Result Value Range   Troponin i, poc 0.03  0.00 - 0.08 ng/mL   Comment 3           CG4 I-STAT (LACTIC ACID)      Result Value Range   Lactic Acid, Venous 0.95  0.5 - 2.2 mmol/L    CT Chest Wo Contrast (Final result)  Result time: 12/27/12 17:57:46    Final result by Rad Results In Interface (12/27/12 17:57:46)    Narrative:   *RADIOLOGY REPORT*  Clinical Data: Chest pain, shortness of breath and hemoptysis. History of lymphoma. Abnormal chest x-ray.  CT CHEST WITHOUT CONTRAST  Technique: Multidetector CT imaging of the chest was performed following the standard protocol without IV contrast.  Comparison: Chest x-ray earlier today. Comparison is also made to the CT images at the time of a prior PET scan on 10/06/2011.  Findings: Prominent airspace disease of the right upper and lower lobes is consistent with pneumonia. There is associated small right-sided pleural effusion. There may also be involvement of the left lower lobe with a tiny left pleural effusion also present.  The soft tissue prominence in the right hilum may simply relate to the pulmonary vessels. There may be some lymph node enlargement present. No gross mediastinal  lymphadenopathy is identified. The heart is mildly enlarged.  IMPRESSION: Airspace disease of the right upper and lower lobes consistent with pneumonia. Associated small right pleural effusion. There may also be a subtle infiltrate in the left lower lobe. As above, soft tissue prominence in the right hilum may relate to pulmonary vasculature. Lymph node enlargement is not excluded.   Original Report Authenticated By: Aletta Edouard, [name]             [facility] Chest [facility] (Final result)  Result time: 12/27/12 16:22:57    Final result by Rad Results In Interface (12/27/12 16:22:57)    Narrative:   *RADIOLOGY REPORT*  Clinical Data: Chest pain/pressure with shortness of breath and hemoptysis. History of diabetes and hypertension.  PORTABLE CHEST - 1 VIEW  Comparison: 11/01/2012 and 03/15/2012.  Findings: 1614 hours. Cardiomegaly and vascular congestion are stable. There is new asymmetric air space disease in the right upper lobe. There is probable patchy opacity at the right lung base. The left lung is clear. There is no pleural effusion or pneumothorax.  IMPRESSION: New asymmetric right lung air space opacities may reflect asymmetric edema, aspirated blood or pneumonia. Radiographic followup recommended.   Original Report Authenticated By: Richardean Sale, [name]     Date: 12/28/2012  Rate: 79  Rhythm: normal sinus rhythm  QRS Axis: normal  Intervals: normal  ST/T Wave abnormalities: nonspecific ST/T changes  Conduction Disutrbances:right bundle branch block and left anterior fascicular block  Narrative Interpretation:   Old EKG Reviewed: changes noted   No results found. No diagnosis found.  MDM  Exam as above, mild hypoxia - improved w/ 3L [place], mild HTN, volume overload on exam w/ BLE edema. Initial STEMI called in triage 2/2 ST elevation in 1 and aVL. Given ASA 324, SL nitro and placed on gtt. cardiology eval at bedside - no STEMI - RBBB. CXR w concerning RUL  infiltrate - aspiration vs hemorrhage w/ pts hx of hemoptysis. CT chest obtained - c/w pneumonia - no hemorrhage. INR 1.74, lactic acid normal, no leukocytosis. Hgb 9.5 - down almost 1 gm in past 10 days, Cr 3.97 - baseline 3. BNP 8369 - held off on diuresis or fluids in ED in light of pts heart failure w/ possible concomitant infection. Placed on rocephin/azithro for CAP.  D/w [facility] and pt admitted for further care. Admit in stable condition.   I have personally reviewed labs and imaging and considered in my MDM. Case d/w Dr Canary Brim  1. Chest pain 2. Pneumonia 3. CHF exacerbation 4. Hypoxia 5. dyspnea   Ernst Spell, MD 12/28/12 0040

## 2012-12-27 NOTE — ED Notes (Signed)
Patient transported to CT 

## 2012-12-28 ENCOUNTER — Encounter (HOSPITAL_COMMUNITY): Payer: Self-pay | Admitting: General Practice

## 2012-12-28 ENCOUNTER — Inpatient Hospital Stay (HOSPITAL_COMMUNITY): Payer: Medicare Other

## 2012-12-28 DIAGNOSIS — J189 Pneumonia, unspecified organism: Principal | ICD-10-CM

## 2012-12-28 DIAGNOSIS — E1129 Type 2 diabetes mellitus with other diabetic kidney complication: Secondary | ICD-10-CM | POA: Diagnosis not present

## 2012-12-28 DIAGNOSIS — I10 Essential (primary) hypertension: Secondary | ICD-10-CM | POA: Diagnosis not present

## 2012-12-28 DIAGNOSIS — R079 Chest pain, unspecified: Secondary | ICD-10-CM | POA: Diagnosis not present

## 2012-12-28 DIAGNOSIS — C8293 Follicular lymphoma, unspecified, intra-abdominal lymph nodes: Secondary | ICD-10-CM | POA: Diagnosis not present

## 2012-12-28 DIAGNOSIS — I82409 Acute embolism and thrombosis of unspecified deep veins of unspecified lower extremity: Secondary | ICD-10-CM | POA: Diagnosis not present

## 2012-12-28 DIAGNOSIS — I059 Rheumatic mitral valve disease, unspecified: Secondary | ICD-10-CM

## 2012-12-28 DIAGNOSIS — N184 Chronic kidney disease, stage 4 (severe): Secondary | ICD-10-CM | POA: Diagnosis not present

## 2012-12-28 LAB — PRO B NATRIURETIC PEPTIDE: Pro B Natriuretic peptide (BNP): 8666 pg/mL — ABNORMAL HIGH (ref 0–125)

## 2012-12-28 LAB — PROTIME-INR
INR: 1.8 — ABNORMAL HIGH (ref 0.00–1.49)
Prothrombin Time: 20.4 s — ABNORMAL HIGH (ref 11.6–15.2)

## 2012-12-28 LAB — HEMOGLOBIN A1C
Hgb A1c MFr Bld: 11 % — ABNORMAL HIGH (ref <5.7)
Mean Plasma Glucose: 269 mg/dL — ABNORMAL HIGH (ref <117)

## 2012-12-28 LAB — RETICULOCYTES
RBC.: 3.75 MIL/uL — ABNORMAL LOW (ref 4.22–5.81)
Retic Count, Absolute: 45 10*3/uL (ref 19.0–186.0)
Retic Ct Pct: 1.2 % (ref 0.4–3.1)

## 2012-12-28 LAB — GLUCOSE, CAPILLARY
Glucose-Capillary: 183 mg/dL — ABNORMAL HIGH (ref 70–99)
Glucose-Capillary: 150 mg/dL — ABNORMAL HIGH (ref 70–99)
Glucose-Capillary: 146 mg/dL — ABNORMAL HIGH (ref 70–99)
Glucose-Capillary: 241 mg/dL — ABNORMAL HIGH (ref 70–99)

## 2012-12-28 LAB — FOLATE: Folate: >20 ng/mL

## 2012-12-28 LAB — IRON AND TIBC
Iron: 16 ug/dL — ABNORMAL LOW (ref 42–135)
Saturation Ratios: 8 % — ABNORMAL LOW (ref 20–55)
TIBC: 201 ug/dL — ABNORMAL LOW (ref 215–435)
UIBC: 185 ug/dL (ref 125–400)

## 2012-12-28 LAB — COMPREHENSIVE METABOLIC PANEL WITH GFR
ALT: 13 U/L (ref 0–53)
AST: 21 U/L (ref 0–37)
Albumin: 2.6 g/dL — ABNORMAL LOW (ref 3.5–5.2)
Alkaline Phosphatase: 105 U/L (ref 39–117)
BUN: 36 mg/dL — ABNORMAL HIGH (ref 6–23)
CO2: 28 meq/L (ref 19–32)
Calcium: 8.5 mg/dL (ref 8.4–10.5)
Chloride: 101 meq/L (ref 96–112)
Creatinine, Ser: 3.71 mg/dL — ABNORMAL HIGH (ref 0.50–1.35)
GFR calc Af Amer: 19 mL/min — ABNORMAL LOW
GFR calc non Af Amer: 16 mL/min — ABNORMAL LOW
Glucose, Bld: 211 mg/dL — ABNORMAL HIGH (ref 70–99)
Potassium: 3.6 meq/L (ref 3.5–5.1)
Sodium: 139 meq/L (ref 135–145)
Total Bilirubin: 0.3 mg/dL (ref 0.3–1.2)
Total Protein: 6.4 g/dL (ref 6.0–8.3)

## 2012-12-28 LAB — CBC
HCT: 29.7 % — ABNORMAL LOW (ref 39.0–52.0)
Hemoglobin: 9.7 g/dL — ABNORMAL LOW (ref 13.0–17.0)
MCH: 25.9 pg — ABNORMAL LOW (ref 26.0–34.0)
MCHC: 32.7 g/dL (ref 30.0–36.0)
MCV: 79.2 fL (ref 78.0–100.0)
Platelets: 203 10*3/uL (ref 150–400)
RBC: 3.75 MIL/uL — ABNORMAL LOW (ref 4.22–5.81)
RDW: 15.6 % — ABNORMAL HIGH (ref 11.5–15.5)
WBC: 3.6 10*3/uL — ABNORMAL LOW (ref 4.0–10.5)

## 2012-12-28 LAB — VITAMIN B12: Vitamin B-12: 992 pg/mL — ABNORMAL HIGH (ref 211–911)

## 2012-12-28 LAB — TROPONIN I: Troponin I: <0.3 ng/mL (ref <0.30)

## 2012-12-28 LAB — FERRITIN: Ferritin: 356 ng/mL — ABNORMAL HIGH (ref 22–322)

## 2012-12-28 MED ORDER — WARFARIN SODIUM 10 MG PO TABS
10.0000 mg | ORAL_TABLET | Freq: Once | ORAL | Status: AC
  Administered 2012-12-28: 10 mg via ORAL
  Filled 2012-12-28: qty 1

## 2012-12-28 MED ORDER — DEXTROSE 5 % IV SOLN
500.0000 mg | INTRAVENOUS | Status: DC
  Administered 2012-12-28: 500 mg via INTRAVENOUS
  Filled 2012-12-28: qty 500

## 2012-12-28 MED ORDER — DEXTROSE 5 % IV SOLN
1.0000 g | INTRAVENOUS | Status: DC
  Administered 2012-12-28: 1 g via INTRAVENOUS
  Filled 2012-12-28: qty 10

## 2012-12-28 MED ORDER — IPRATROPIUM BROMIDE 0.02 % IN SOLN: 0.5000 mg | RESPIRATORY_TRACT | Status: DC | PRN

## 2012-12-28 MED ORDER — NITROGLYCERIN 0.4 MG SL SUBL
SUBLINGUAL_TABLET | SUBLINGUAL | Status: AC
  Administered 2012-12-28: 01:00:00
  Filled 2012-12-28: qty 50

## 2012-12-28 MED ORDER — ALBUTEROL SULFATE (5 MG/ML) 0.5% IN NEBU: 2.5000 mg | INHALATION_SOLUTION | RESPIRATORY_TRACT | Status: DC | PRN

## 2012-12-28 NOTE — Progress Notes (Signed)
Subjective: *Claims to feel better and much improved today. No chest pain. No shortness of breath.  Objective: Vital signs in last 24 hours: Temp:  [97.6 F (36.4 C)-98.7 F (37.1 C)] 98.3 F (36.8 C) (07/30 0551) Pulse Rate:  [77-88] 82 (07/30 0551) Resp:  [13-20] 18 (07/30 0551) BP: (145-154)/(65-112) 145/78 mmHg (07/30 0551) SpO2:  [90 %-95 %] 94 % (07/30 0551) Weight:  [146.33 kg (322 lb 9.6 oz)-147.4 kg (324 lb 15.3 oz)] 146.33 kg (322 lb 9.6 oz) (07/30 0551) Weight change:   CBG (last 3)   Recent Labs  12/27/12 2143 12/28/12 0649 12/28/12 1117  GLUCAP 322* 183* 150*    Intake/Output from previous day: 07/29 0701 - 07/30 0700 In: 610 [P.O.:360; IV Piggyback:250] Out: [id number] [Urine:1285]  Physical Exam: Alert awake lying supine wearing oxygen no distress. Lungs reveal decreased breath sounds overall no rales wheezing or rhonchi. Cardiovascular exam distant heart sounds regular abdomen soft and nontender. Extremities venous stasis changes 1+ edema right greater than left. Neurologically nonlateralizing  Lab Results:  Recent Labs  12/27/12 1600 12/28/12 0524  NA 136 139  K 3.3* 3.6  CL 99 101  CO2 25 28  GLUCOSE 326* 211*  BUN 37* 36*  CREATININE 3.97* 3.71*  CALCIUM 8.3* 8.5    Recent Labs  12/28/12 0524  AST 21  ALT 13  ALKPHOS 105  BILITOT 0.3  PROT 6.4  ALBUMIN 2.6*    Recent Labs  12/27/12 1600 12/28/12 0524  WBC 4.8 3.6*  HGB 9.5* 9.7*  HCT 28.7* 29.7*  MCV 78.2 79.2  PLT 207 203   Lab Results  Component Value Date   INR 1.80* 12/28/2012   INR 1.74* 12/27/2012   INR 1.11 11/09/2012    Recent Labs  12/27/12 1712 12/27/12 2245 12/28/12 0524  TROPONINI <0.30 <0.30 <0.30   No results found for this basename: TSH, T4TOTAL, FREET3, T3FREE, THYROIDAB,  in the last 72 hours  Recent Labs  12/28/12 0524  VITAMINB12 992*  FOLATE >20.0  FERRITIN 356*  TIBC 201*  IRON 16*  RETICCTPCT 1.2    Studies/Results: X-ray Chest Pa And  Lateral   12/28/2012   *RADIOLOGY REPORT*  Clinical Data: Pain, pneumonia  CHEST - 2 VIEW  Comparison: 12/27/2012  Findings: Patchy consolidative right upper lobe and bibasilar airspace disease compatible with multilobar pneumonia.  Small right effusion evident.  Trachea is midline.  No pneumothorax.  Heart is enlarged but no associated edema.  IMPRESSION: Stable bilateral airspace disease, worse in the right lung compatible with pneumonia   Original Report Authenticated By: Jerilynn Mages. Annamaria Boots, [name]   Ct Chest Wo Contrast  12/27/2012   *RADIOLOGY REPORT*  Clinical Data: Chest pain, shortness of breath and hemoptysis. History of lymphoma.  Abnormal chest x-ray.  CT CHEST WITHOUT CONTRAST  Technique:  Multidetector CT imaging of the chest was performed following the standard protocol without IV contrast.  Comparison: Chest x-ray earlier today.  Comparison is also made to the CT images at the time of a prior PET scan on 10/06/2011.  Findings: Prominent airspace disease of the right upper and lower lobes is consistent with pneumonia.  There is associated small right-sided pleural effusion.  There may also be involvement of the left lower lobe with a tiny left pleural effusion also present.  The soft tissue prominence in the right hilum may simply relate to the pulmonary vessels.  There may be some lymph node enlargement present.  No gross mediastinal lymphadenopathy is identified.  The heart  is mildly enlarged.  IMPRESSION: Airspace disease of the right upper and lower lobes consistent with pneumonia.  Associated small right pleural effusion.  There may also be a subtle infiltrate in the left lower lobe.  As above, soft tissue prominence in the right hilum may relate to pulmonary vasculature.  Lymph node enlargement is not excluded.   Original Report Authenticated By: Aletta Edouard, [name]   Dg Chest Port 1 View  12/27/2012   *RADIOLOGY REPORT*  Clinical Data: Chest pain/pressure with shortness of breath and hemoptysis.   History of diabetes and hypertension.  PORTABLE CHEST - 1 VIEW  Comparison: 11/01/2012 and 03/15/2012.  Findings: 1614 hours.  Cardiomegaly and vascular congestion are stable.  There is new asymmetric air space disease in the right upper lobe.  There is probable patchy opacity at the right lung base.  The left lung is clear.  There is no pleural effusion or pneumothorax.  IMPRESSION: New asymmetric right lung air space opacities may reflect asymmetric edema, aspirated blood or pneumonia.  Radiographic followup recommended.   Original Report Authenticated By: Richardean Sale, [name]     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 If he doing well we will transition to orals   LOS: 1 day   Carlyann Placide A 12/28/2012, 1:40 PM

## 2012-12-28 NOTE — Progress Notes (Signed)
Pt complains of chest sharp pain,localized with PS 6/10 . Nitroglycerin 0.4 mg SL given x 2 , pain eases from 6/10 to 1/10.VS and 12L EKG done. Denies any nausea and vomiting, not in respiratory distress, no neuro deficit noted. Dr. Osborne Casco was notified. Will continue to monitor pt

## 2012-12-28 NOTE — Progress Notes (Signed)
ANTICOAGULATION CONSULT NOTE - Follow Up Consult  Pharmacy Consult for Coumadin Indication: hx of DVT  No Known Allergies  Patient Measurements: Height: 6\' 3"  (190.5 cm) Weight: 322 lb 9.6 oz (146.33 kg) (SCALE b) IBW/kg (Calculated) : 84.5   Vital Signs: Temp: 98.3 F (36.8 C) (07/30 0551) Temp src: Oral (07/30 0551) BP: 145/78 mmHg (07/30 0551) Pulse Rate: 82 (07/30 0551)  Labs:  Recent Labs  12/27/12 1600 12/27/12 1655 12/27/12 1712 12/27/12 2245 12/28/12 0524  HGB 9.5*  --   --   --  9.7*  HCT 28.7*  --   --   --  29.7*  PLT 207  --   --   --  203  LABPROT  --  19.8*  --   --  20.4*  INR  --  1.74*  --   --  1.80*  CREATININE 3.97*  --   --   --  3.71*  TROPONINI  --   --  <0.30 <0.30 <0.30    Estimated Creatinine Clearance: 33.1 ml/min (by C-G formula based on Cr of 3.71).   Medications:  Prescriptions prior to admission  Medication Sig Dispense Refill  . amLODipine (NORVASC) 10 MG tablet Take 1 tablet (10 mg total) by mouth at bedtime.  30 tablet  6  . calcitRIOL (ROCALTROL) 0.25 MCG capsule Take 0.25 mcg by mouth daily.        . carvedilol (COREG) 25 MG tablet Take 25 mg by mouth 2 (two) times daily with a meal.      . furosemide (LASIX) 80 MG tablet Take 80 mg by mouth 2 (two) times daily.      . insulin glargine (LANTUS) 100 UNIT/ML injection Inject 60 Units into the skin at bedtime.      . insulin lispro (HUMALOG) 100 UNIT/ML injection Inject 10 Units into the skin 2 (two) times daily.      Marland Kitchen KLOR-CON M20 20 MEQ tablet Take 20 mg by mouth Daily.      . methocarbamol (ROBAXIN) 500 MG tablet Take 500 mg by mouth 2 (two) times daily as needed (for muscle spasms).      . polysaccharide iron (NIFEREX) 150 MG CAPS capsule Take 1 capsule (150 mg total) by mouth daily.  30 each  2  . traMADol (ULTRAM) 50 MG tablet Take 50 mg by mouth every 6 (six) hours as needed for pain.      Marland Kitchen warfarin (COUMADIN) 5 MG tablet Take 5-10 mg by mouth daily. Take 10mg  daily  except 5mg  on Mondays and Fridays        Assessment: Patient is a 62 y.o M on coumadin PTA for hx DVT (s/p IVC filter) presented to the ED with c/o of chest pain. RBBB noted on EKG. Patient reported some hemoptysis today (MD is aware). INR is subtherapeutic at 1.8 today. Per Patient, home coumadin regimen is 10mg  daily except 5mg  on Mondays and Fridays with dose taken 7/29 morning prior coming to the ED.  No more hemoptysis noted per RN.  Abx (azithromycin) was restarted inpatient for pna, can increase inr, will monitor.  Goal of Therapy:  INR 2-3 Monitor platelets by anticoagulation protocol: Yes   Plan:  1) Coumadin 10 mg x1 2) Daily INR 3) monitor for further episodes of hemoptysis 4) Coumadin edu  Jeronimo Norma, PharmD Clinical Pharmacist-Resident Pager: [phone]

## 2012-12-28 NOTE — Progress Notes (Signed)
  Echocardiogram 2D Echocardiogram has been performed.  Kashus Karlen, [place] 12/28/2012, 11:45 AM

## 2012-12-28 NOTE — Progress Notes (Signed)
Utilization Review Completed Amoy Steeves J. Mirinda Monte, RN, BSN, NCM [phone]  

## 2012-12-28 NOTE — Progress Notes (Signed)
Pt is alert and oriented x4. Pt has been sleeping throughout the shift. Pt continues to cough up a bloody sputum. Pt has not had any complaints of chest pain throughout the shift.

## 2012-12-28 NOTE — Progress Notes (Signed)
Subjective: No current CP.  It was worse with cough and breathing.  Objective: Vital signs in last 24 hours: Temp:  [97.6 F (36.4 C)-98.7 F (37.1 C)] 97.9 F (36.6 C) (07/30 1300) Pulse Rate:  [77-88] 81 (07/30 1300) Resp:  [13-20] 18 (07/30 1300) BP: (145-152)/(65-112) 148/73 mmHg (07/30 1300) SpO2:  [90 %-95 %] 91 % (07/30 1300) Weight:  [322 lb 9.6 oz (146.33 kg)-324 lb 15.3 oz (147.4 kg)] 322 lb 9.6 oz (146.33 kg) (07/30 0551) Last BM Date: 12/27/12  Intake/Output from previous day: 07/29 0701 - 07/30 0700 In: 56 [P.O.:360; IV Piggyback:250] Out: 1285 [Urine:1285] Intake/Output this shift: Total I/O In: 540 [P.O.:540] Out: 1125 [Urine:1125]  Medications Current Facility-Administered Medications  Medication Dose Route Frequency Provider Last Rate Last Dose  . 0.9 %  sodium chloride infusion  250 mL Intravenous PRN Haywood Pao, MD      . acetaminophen (TYLENOL) tablet 650 mg  650 mg Oral Q6H PRN Haywood Pao, MD       Or  . acetaminophen (TYLENOL) suppository 650 mg  650 mg Rectal Q6H PRN Haywood Pao, MD      . amLODipine (NORVASC) tablet 10 mg  10 mg Oral QHS Haywood Pao, MD   10 mg at 12/27/12 2216  . azithromycin (ZITHROMAX) 500 mg in dextrose 5 % 250 mL IVPB  500 mg Intravenous Q24H Geoffery Lyons, MD      . calcitRIOL (ROCALTROL) capsule 0.25 mcg  0.25 mcg Oral Daily Haywood Pao, MD   0.25 mcg at 12/28/12 1000  . carvedilol (COREG) tablet 25 mg  25 mg Oral BID WC Haywood Pao, MD   25 mg at 12/28/12 0700  . cefTRIAXone (ROCEPHIN) 1 g in dextrose 5 % 50 mL IVPB  1 g Intravenous Q24H Geoffery Lyons, MD      . docusate sodium (COLACE) capsule 100 mg  100 mg Oral BID Haywood Pao, MD   100 mg at 12/28/12 1000  . furosemide (LASIX) tablet 80 mg  80 mg Oral BID Haywood Pao, MD   80 mg at 12/28/12 0800  . insulin aspart (novoLOG) injection 0-9 Units  0-9 Units Subcutaneous TID WC Haywood Pao, MD   1 Units at  12/28/12 1100  . insulin glargine (LANTUS) injection 60 Units  60 Units Subcutaneous QHS Haywood Pao, MD   60 Units at 12/27/12 2217  . iron polysaccharides (NIFEREX) capsule 150 mg  150 mg Oral Daily Haywood Pao, MD   150 mg at 12/28/12 1000  . methocarbamol (ROBAXIN) tablet 500 mg  500 mg Oral BID PRN Haywood Pao, MD      . ondansetron [facility]) tablet 4 mg  4 mg Oral Q6H PRN Haywood Pao, MD       Or  . ondansetron [facility]) injection 4 mg  4 mg Intravenous Q6H PRN Haywood Pao, MD      . potassium chloride SA (K-DUR,KLOR-CON) CR tablet 20 mEq  20 mEq Oral Daily Haywood Pao, MD   20 mEq at 12/28/12 1000  . sodium chloride 0.9 % injection 3 mL  3 mL Intravenous Q12H Haywood Pao, MD   3 mL at 12/28/12 1000  . sodium chloride 0.9 % injection 3 mL  3 mL Intravenous PRN Haywood Pao, MD      . traMADol Veatrice Bourbon) tablet 50 mg  50 mg Oral Q6H PRN Haywood Pao, MD  50 mg at 12/28/12 0116  . warfarin (COUMADIN) tablet 10 mg  10 mg Oral ONCE-1800 Jeronimo Norma, RPH      . Warfarin - Pharmacist Dosing Inpatient   Does not apply q1800 Anh P Pham, RPH      . zolpidem (AMBIEN) tablet 5 mg  5 mg Oral QHS PRN Haywood Pao, MD        PE: General appearance: alert, cooperative and no distress Lungs: Decreased BS bilaterally.  Fine crackles bilaterally.   No wheeze or rhochi Heart: regular rate and rhythm and Split S2.  No MM Extremities: 1+ LEE Pulses: radials 2+ and symmetric Neurologic: Grossly normal  Lab Results:   Recent Labs  12/27/12 1600 12/28/12 0524  WBC 4.8 3.6*  HGB 9.5* 9.7*  HCT 28.7* 29.7*  PLT 207 203   BMET  Recent Labs  12/27/12 1600 12/28/12 0524  NA 136 139  K 3.3* 3.6  CL 99 101  CO2 25 28  GLUCOSE 326* 211*  BUN 37* 36*  CREATININE 3.97* 3.71*  CALCIUM 8.3* 8.5   PT/INR  Recent Labs  12/27/12 1655 12/28/12 0524  LABPROT 19.8* 20.4*  INR 1.74* 1.80*   Lipid Panel     Component Value Date/Time   CHOL  129 01/14/2011 0437   TRIG 137 01/14/2011 0437   HDL 13* 01/14/2011 0437   CHOLHDL 9.9 01/14/2011 0437   VLDL 27 01/14/2011 0437   LDLCALC 89 01/14/2011 0437    Cardiac Panel (last 3 results)  Recent Labs  12/27/12 1712 12/27/12 2245 12/28/12 0524  TROPONINI <0.30 <0.30 <0.30   CHEST - 2 VIEW  Comparison: 12/27/2012  Findings: Patchy consolidative right upper lobe and bibasilar airspace disease compatible with multilobar pneumonia. Small right effusion evident. Trachea is midline. No pneumothorax. Heart is enlarged but no associated edema.  IMPRESSION: Stable bilateral airspace disease, worse in the right lung compatible with pneumonia     Assessment/Plan  Principal Problem:   Chest pain with low risk for cardiac etiology Active Problems:   DIABETES MELLITUS-TYPE II   History of DVT of lower extremity   Hypertension   CKD (chronic kidney disease) stage 4, GFR 15-29 ml/min   Chronic anticoagulation  Plan:  The pt ruled out for MI.  BP and HR stable.  Bilateral air space disease worse on the right on today's CXR.   CP atypical and pleuritic likely related to cough and PNA.  Low risk NST in the last 10 months.   LOS: 1 day    HAGER, BRYAN 12/28/2012 5:20 PM    Patient seen and examined. Agree with assessment and plan. Chest pain non cardiac;pleuritic and cough secondary to pneumonia. ECG with RBBB. BNP elevated on lasix 80 bid. Will sign off and be available if needed.  Troy Sine, MD, [facility] 12/28/2012 8:59 PM

## 2012-12-29 ENCOUNTER — Inpatient Hospital Stay (HOSPITAL_COMMUNITY): Admission: RE | Admit: 2012-12-29 | Payer: Medicare Other | Source: Ambulatory Visit

## 2012-12-29 DIAGNOSIS — I82409 Acute embolism and thrombosis of unspecified deep veins of unspecified lower extremity: Secondary | ICD-10-CM | POA: Diagnosis not present

## 2012-12-29 DIAGNOSIS — I10 Essential (primary) hypertension: Secondary | ICD-10-CM | POA: Diagnosis not present

## 2012-12-29 DIAGNOSIS — N184 Chronic kidney disease, stage 4 (severe): Secondary | ICD-10-CM | POA: Diagnosis not present

## 2012-12-29 LAB — BASIC METABOLIC PANEL
BUN: 34 mg/dL — ABNORMAL HIGH (ref 6–23)
CO2: 25 mEq/L (ref 19–32)
Calcium: 8.8 mg/dL (ref 8.4–10.5)
Chloride: 104 mEq/L (ref 96–112)
Creatinine, Ser: 3.5 mg/dL — ABNORMAL HIGH (ref 0.50–1.35)
GFR calc Af Amer: 20 mL/min — ABNORMAL LOW (ref >90)
GFR calc non Af Amer: 18 mL/min — ABNORMAL LOW (ref >90)
Glucose, Bld: 80 mg/dL (ref 70–99)
Potassium: 3.3 mEq/L — ABNORMAL LOW (ref 3.5–5.1)
Sodium: 140 mEq/L (ref 135–145)

## 2012-12-29 LAB — CBC
HCT: 31 % — ABNORMAL LOW (ref 39.0–52.0)
Hemoglobin: 10.4 g/dL — ABNORMAL LOW (ref 13.0–17.0)
MCH: 26.3 pg (ref 26.0–34.0)
MCHC: 33.5 g/dL (ref 30.0–36.0)
MCV: 78.5 fL (ref 78.0–100.0)
Platelets: 253 10*3/uL (ref 150–400)
RBC: 3.95 MIL/uL — ABNORMAL LOW (ref 4.22–5.81)
RDW: 15.5 % (ref 11.5–15.5)
WBC: 4.5 10*3/uL (ref 4.0–10.5)

## 2012-12-29 LAB — PROTIME-INR
INR: 1.9 — ABNORMAL HIGH (ref 0.00–1.49)
Prothrombin Time: 21.2 seconds — ABNORMAL HIGH (ref 11.6–15.2)

## 2012-12-29 LAB — GLUCOSE, CAPILLARY
Glucose-Capillary: 72 mg/dL (ref 70–99)
Glucose-Capillary: 134 mg/dL — ABNORMAL HIGH (ref 70–99)
Glucose-Capillary: 100 mg/dL — ABNORMAL HIGH (ref 70–99)

## 2012-12-29 MED ORDER — LEVOFLOXACIN 250 MG PO TABS: 250.0000 mg | ORAL_TABLET | Freq: Every day | ORAL | Status: DC

## 2012-12-29 MED ORDER — WARFARIN SODIUM 10 MG PO TABS
10.0000 mg | ORAL_TABLET | Freq: Once | ORAL | Status: AC
  Administered 2012-12-29: 10 mg via ORAL
  Filled 2012-12-29: qty 1

## 2012-12-29 MED ORDER — LEVOFLOXACIN 500 MG PO TABS
500.0000 mg | ORAL_TABLET | Freq: Once | ORAL | Status: AC
  Administered 2012-12-29: 500 mg via ORAL
  Filled 2012-12-29: qty 1

## 2012-12-29 MED ORDER — POLYSACCHARIDE IRON COMPLEX 150 MG PO CAPS: 150.0000 mg | ORAL_CAPSULE | Freq: Every day | ORAL | Status: DC

## 2012-12-29 NOTE — Progress Notes (Signed)
Subjective: Cough with sputum dissipating. Good pos. Back controlled. resp stable.  Objective: Vital signs in last 24 hours: Temp:  [97.9 F (36.6 C)-98.6 F (37 C)] 97.9 F (36.6 C) (07/31 0542) Pulse Rate:  [81-86] 82 (07/31 0542) Resp:  [18-20] 20 (07/31 0542) BP: (145-150)/(72-80) 147/72 mmHg (07/31 0542) SpO2:  [91 %-100 %] 96 % (07/31 0542) Weight:  [145.605 kg (321 lb)] 145.605 kg (321 lb) (07/31 0542) Weight change: -1.795 kg (-3 lb 15.3 oz)  CBG (last 3)   Recent Labs  12/28/12 1117 12/28/12 1606 12/28/12 2042  GLUCAP 150* 146* 241*    Intake/Output from previous day: 07/30 0701 - 07/31 0700 In: [id number] [P.O.:1200; I.V.:6; IV Piggyback:300] Out: 1900 [Urine:1900]  Physical Exam: Alert awake lying supine wearing oxygen no distress. Lungs reveal decreased breath sounds overall no rales wheezing or rhonchi. Cardiovascular exam distant heart sounds regular abdomen soft and nontender. Extremities venous stasis changes 1+ edema right greater than left. Neurologically nonlateralizing    Lab Results:  Recent Labs  12/28/12 0524 12/29/12 0435  NA 139 140  K 3.6 3.3*  CL 101 104  CO2 28 25  GLUCOSE 211* 80  BUN 36* 34*  CREATININE 3.71* 3.50*  CALCIUM 8.5 8.8    Recent Labs  12/28/12 0524  AST 21  ALT 13  ALKPHOS 105  BILITOT 0.3  PROT 6.4  ALBUMIN 2.6*    Recent Labs  12/28/12 0524 12/29/12 0435  WBC 3.6* 4.5  HGB 9.7* 10.4*  HCT 29.7* 31.0*  MCV 79.2 78.5  PLT 203 253   Lab Results  Component Value Date   INR 1.90* 12/29/2012   INR 1.80* 12/28/2012   INR 1.74* 12/27/2012    Recent Labs  12/27/12 1712 12/27/12 2245 12/28/12 0524  TROPONINI <0.30 <0.30 <0.30   No results found for this basename: TSH, T4TOTAL, FREET3, T3FREE, THYROIDAB,  in the last 72 hours  Recent Labs  12/28/12 0524  VITAMINB12 992*  FOLATE >20.0  FERRITIN 356*  TIBC 201*  IRON 16*  RETICCTPCT 1.2    Studies/Results: X-ray Chest Pa And Lateral    12/28/2012   *RADIOLOGY REPORT*  Clinical Data: Pain, pneumonia  CHEST - 2 VIEW  Comparison: 12/27/2012  Findings: Patchy consolidative right upper lobe and bibasilar airspace disease compatible with multilobar pneumonia.  Small right effusion evident.  Trachea is midline.  No pneumothorax.  Heart is enlarged but no associated edema.  IMPRESSION: Stable bilateral airspace disease, worse in the right lung compatible with pneumonia   Original Report Authenticated By: Jerilynn Mages. Annamaria Boots, [name]   Ct Chest Wo Contrast  12/27/2012   *RADIOLOGY REPORT*  Clinical Data: Chest pain, shortness of breath and hemoptysis. History of lymphoma.  Abnormal chest x-ray.  CT CHEST WITHOUT CONTRAST  Technique:  Multidetector CT imaging of the chest was performed following the standard protocol without IV contrast.  Comparison: Chest x-ray earlier today.  Comparison is also made to the CT images at the time of a prior PET scan on 10/06/2011.  Findings: Prominent airspace disease of the right upper and lower lobes is consistent with pneumonia.  There is associated small right-sided pleural effusion.  There may also be involvement of the left lower lobe with a tiny left pleural effusion also present.  The soft tissue prominence in the right hilum may simply relate to the pulmonary vessels.  There may be some lymph node enlargement present.  No gross mediastinal lymphadenopathy is identified.  The heart is mildly enlarged.  IMPRESSION: Airspace  disease of the right upper and lower lobes consistent with pneumonia.  Associated small right pleural effusion.  There may also be a subtle infiltrate in the left lower lobe.  As above, soft tissue prominence in the right hilum may relate to pulmonary vasculature.  Lymph node enlargement is not excluded.   Original Report Authenticated By: Aletta Edouard, [name]   Dg Chest Port 1 View  12/27/2012   *RADIOLOGY REPORT*  Clinical Data: Chest pain/pressure with shortness of breath and hemoptysis.  History of  diabetes and hypertension.  PORTABLE CHEST - 1 VIEW  Comparison: 11/01/2012 and 03/15/2012.  Findings: 1614 hours.  Cardiomegaly and vascular congestion are stable.  There is new asymmetric air space disease in the right upper lobe.  There is probable patchy opacity at the right lung base.  The left lung is clear.  There is no pleural effusion or pneumothorax.  IMPRESSION: New asymmetric right lung air space opacities may reflect asymmetric edema, aspirated blood or pneumonia.  Radiographic followup recommended.   Original Report Authenticated By: Richardean Sale, [name]     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

## 2012-12-29 NOTE — Progress Notes (Signed)
Pt being d/c to home, pt given dc instructions, medications and follow up appointments reviewed pt verbzlied understanding, pt leaving with friend via wheelchair, pt stable

## 2012-12-29 NOTE — Progress Notes (Signed)
Pt. A/O x 4 and is ambulatory with 1 person assist. Around 2300 Patient had c/o SOB with exertion. On 2.5 L [place] oxygen, oxygen saturation was 93%. Patient has history of pneumonia. Incentive spirometer given and  MD on call Dr.Hermann was called and notified. New orders were given for breathing treatments prn. Patient had no further c/o SOB.

## 2012-12-29 NOTE — Progress Notes (Signed)
ANTICOAGULATION CONSULT NOTE - Follow Up Consult  Pharmacy Consult for Coumadin  Indication: hx of DVT  No Known Allergies  Patient Measurements: Height: 6\' 3"  (190.5 cm) Weight: 321 lb (145.605 kg) (SCALE B) IBW/kg (Calculated) : 84.5   Vital Signs: Temp: 97.9 F (36.6 C) (07/31 0542) Temp src: Oral (07/31 0542) BP: 147/72 mmHg (07/31 0542) Pulse Rate: 82 (07/31 0542)  Labs:  Recent Labs  12/27/12 1600 12/27/12 1655 12/27/12 1712 12/27/12 2245 12/28/12 0524 12/29/12 0435  HGB 9.5*  --   --   --  9.7* 10.4*  HCT 28.7*  --   --   --  29.7* 31.0*  PLT 207  --   --   --  203 253  LABPROT  --  19.8*  --   --  20.4* 21.2*  INR  --  1.74*  --   --  1.80* 1.90*  CREATININE 3.97*  --   --   --  3.71* 3.50*  TROPONINI  --   --  <0.30 <0.30 <0.30  --     Estimated Creatinine Clearance: 35 ml/min (by C-G formula based on Cr of 3.5).   Medications:  Prescriptions prior to admission  Medication Sig Dispense Refill  . amLODipine (NORVASC) 10 MG tablet Take 1 tablet (10 mg total) by mouth at bedtime.  30 tablet  6  . calcitRIOL (ROCALTROL) 0.25 MCG capsule Take 0.25 mcg by mouth daily.        . carvedilol (COREG) 25 MG tablet Take 25 mg by mouth 2 (two) times daily with a meal.      . furosemide (LASIX) 80 MG tablet Take 80 mg by mouth 2 (two) times daily.      . insulin glargine (LANTUS) 100 UNIT/ML injection Inject 60 Units into the skin at bedtime.      . insulin lispro (HUMALOG) 100 UNIT/ML injection Inject 10 Units into the skin 2 (two) times daily.      Marland Kitchen KLOR-CON M20 20 MEQ tablet Take 20 mg by mouth Daily.      . methocarbamol (ROBAXIN) 500 MG tablet Take 500 mg by mouth 2 (two) times daily as needed (for muscle spasms).      . polysaccharide iron (NIFEREX) 150 MG CAPS capsule Take 1 capsule (150 mg total) by mouth daily.  30 each  2  . traMADol (ULTRAM) 50 MG tablet Take 50 mg by mouth every 6 (six) hours as needed for pain.      Marland Kitchen warfarin (COUMADIN) 5 MG tablet Take  5-10 mg by mouth daily. Take 10mg  daily except 5mg  on Mondays and Fridays        Assessment: Patient is a 62 y.o M on coumadin PTA for hx DVT (s/p IVC filter) presented to the ED with c/o of chest pain. RBBB noted on EKG. Patient reported some hemoptysis 7/29 (MD is aware). Per Patient, home coumadin regimen is 10mg  daily except 5mg  on Mondays and Fridays with dose taken 7/29 morning prior coming to the ED.  Patient is subtherapeutic today, INR 1.9 (1.8 yesterday) with a 10 mg dose last pm.  Will continue with current dose and f/u INR.  Goal of Therapy:  INR 2-3 Monitor platelets by anticoagulation protocol: Yes   Plan:  1) Coumadin 10mg  x1 2) Daily INR 3) monitor for bleeding  Jeronimo Norma, PharmD Clinical Pharmacist Resident Pager: [phone]

## 2012-12-29 NOTE — Clinical Documentation Improvement (Signed)
THIS DOCUMENT IS NOT A PERMANENT PART OF THE MEDICAL RECORD  Please update your documentation with the medical record to reflect your response to this query. If you need help knowing how to do this please call .  12/29/12  Dr. Reynaldo Minium,  In a better effort to capture your patient's severity of illness, reflect appropriate length of stay and utilization of resources, a review of the patient medical record has revealed the following information:   "Mild Congestive Heart Failure - echo with normal LV function, stable on current diuretics"    Progress Notes signed by Geoffery Lyons, MD at 12/29/2012 7:43 AM     Based on your clinical judgment, please document the Possible, Likely or Known ACUITY and TYPE of Heart Failure monitored and treated this admission:  ACUITY:  - Acute  - Chronic  - Acute on Chronic  AND  TYPE:  - Systolic  - Diastolic  - Combined   In responding to this query please exercise your independent judgment.    The fact that a query is asked, does not imply that any particular answer is desired or expected.   Reviewed: 01/09/13 - no response to my query.  No drgi or soi/rom impact.  No RQ.  Vilinda Flake RN  Thank You,  Erling Conte  RN BSN CCDS  Certified Clinical Documentation Specialist [phone] Health Information Management [place]

## 2012-12-29 NOTE — ED Provider Notes (Signed)
I saw and evaluated the patient, reviewed the resident's note and I agree with the findings and plan.  I have reviewed and agree with the EKG findings as documented by the resident.  \  Pt seen emergently after brought back from triage as a code stemi activation.  Pt with ?ST elevations versus changes with bundle branch block.  He was having chest pain at approx 6/10.  Cardiology contacted urgently and came down to the ED to evaluate patient.  Dr. Gwenlyn Found is patient's cardiologist- he feels ekg changes are due to bundle branch block.  Pt treated with nitro drip, aspirin.  CXR shows likely pneumonia- started on IV abx for this.  Pt admitted to medical service for further management.  CRITICAL CARE Performed by: Threasa Beards Total critical care time: 40 Critical care time was exclusive of separately billable procedures and treating other patients. Critical care was necessary to treat or prevent imminent or life-threatening deterioration. Critical care was time spent personally by me on the following activities: development of treatment plan with patient and/or surrogate as well as nursing, discussions with consultants, evaluation of patient's response to treatment, examination of patient, obtaining history from patient or surrogate, ordering and performing treatments and interventions, ordering and review of laboratory studies, ordering and review of radiographic studies, pulse oximetry and re-evaluation of patient's condition.  Threasa Beards, MD 12/29/12 [phone]

## 2012-12-29 NOTE — Discharge Summary (Signed)
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     calcitRIOL 0.25 MCG capsule  Commonly known as:  ROCALTROL  Take 0.25 mcg by mouth daily.     carvedilol 25 MG tablet  Commonly known as:  COREG  Take 25 mg by mouth 2 (two) times daily with a meal.     furosemide 80 MG tablet  Commonly known as:  LASIX  Take 80 mg by mouth 2 (two) times daily.     insulin glargine 100 UNIT/ML injection  Commonly known as:  LANTUS  Inject 60 Units into the skin at bedtime.     insulin lispro 100 UNIT/ML injection  Commonly known as:  HUMALOG  Inject 10 Units into the skin 2 (two) times daily.     KLOR-CON M20 20 MEQ tablet  Generic drug:  potassium chloride SA  Take 20 mg by mouth Daily.     levofloxacin 250 MG tablet  Commonly known as:  LEVAQUIN  Take 1 tablet (250 mg total) by mouth daily.  Start taking on:  12/30/2012     methocarbamol 500 MG tablet  Commonly known as:  ROBAXIN  Take 500 mg by mouth 2 (two) times daily as needed (for muscle spasms).     polysaccharide iron 150 MG capsule  Generic drug:  iron polysaccharides  Take 1 capsule (150 mg total) by mouth daily.     traMADol 50 MG tablet  Commonly known as:  ULTRAM  Take 50 mg by mouth every 6 (six) hours as needed for pain.     warfarin 5  MG tablet  Commonly known as:  COUMADIN  Take 5-10 mg by mouth daily. Take 10mg  daily except 5mg  on Mondays and Fridays        Hospital Procedures: X-ray Chest Pa And Lateral   12/28/2012   *RADIOLOGY REPORT*  Clinical Data: Pain, pneumonia  CHEST - 2 VIEW  Comparison: 12/27/2012  Findings: Patchy consolidative right upper lobe and bibasilar airspace disease compatible with multilobar pneumonia.  Small right effusion evident.  Trachea is midline.  No pneumothorax.  Heart is enlarged but no associated edema.  IMPRESSION: Stable bilateral airspace disease, worse in the right lung compatible with pneumonia   Original Report Authenticated By: Jerilynn Mages. Annamaria Boots, [name]   Ct Chest Wo Contrast  12/27/2012   *RADIOLOGY REPORT*  Clinical Data: Chest pain, shortness of breath and hemoptysis. History of lymphoma.  Abnormal chest x-ray.  CT CHEST WITHOUT CONTRAST  Technique:  Multidetector CT imaging of the chest was performed following the standard protocol without IV contrast.  Comparison: Chest x-ray earlier today.  Comparison is also made to the CT images at the time of a prior PET scan on 10/06/2011.  Findings: Prominent airspace disease of the right upper and lower lobes is consistent with pneumonia.  There  is associated small right-sided pleural effusion.  There may also be involvement of the left lower lobe with a tiny left pleural effusion also present.  The soft tissue prominence in the right hilum may simply relate to the pulmonary vessels.  There may be some lymph node enlargement present.  No gross mediastinal lymphadenopathy is identified.  The heart is mildly enlarged.  IMPRESSION: Airspace disease of the right upper and lower lobes consistent with pneumonia.  Associated small right pleural effusion.  There may also be a subtle infiltrate in the left lower lobe.  As above, soft tissue prominence in the right hilum may relate to pulmonary vasculature.  Lymph node enlargement is not excluded.   Original Report  Authenticated By: Aletta Edouard, [name]   Dg Chest Port 1 View  12/27/2012   *RADIOLOGY REPORT*  Clinical Data: Chest pain/pressure with shortness of breath and hemoptysis.  History of diabetes and hypertension.  PORTABLE CHEST - 1 VIEW  Comparison: 11/01/2012 and 03/15/2012.  Findings: 1614 hours.  Cardiomegaly and vascular congestion are stable.  There is new asymmetric air space disease in the right upper lobe.  There is probable patchy opacity at the right lung base.  The left lung is clear.  There is no pleural effusion or pneumothorax.  IMPRESSION: New asymmetric right lung air space opacities may reflect asymmetric edema, aspirated blood or pneumonia.  Radiographic followup recommended.   Original Report Authenticated By: Richardean Sale, [name]    History of Present Illness:  Patient is a 62 year old African American male with a history of LE DVT with placement of IVC filter 2 months ago, Coumadin therapy and Lymphoma as well as DM2. Came to the [facility] with a complaint of chest pain beginning last night which started with a dry cough several days prior. Sputum is productive with some blood noted. Was put on IV NTG in ER and evaluated by Cardiology. They do not believe he is code STEMI as initially called, suggest cycling troponins and requested Internal Medicine admit the patient. He state compliance with meds, but has not had his INR checked in our office since early June and not seen for an interim visit. His INR is low today, his kidney function is worsened, his blood glucose is high and his potassium is 3.3. He still notes the pain in his chest which has improved since his arrival.  Hospital Course: Patient was admitted placed on IV antibiotics and subsequently converted to oral Levaquin. The Levaquin was adjusted for renal dosing.  Oxygen was discontinued and oxygen saturation remained 95% on room air.  Cardiology did consult and myocardial infarct was ruled out-further to be done as an  outpatient.  Echo demonstrated preserved lv function with no wall motion abnormalities.  Oral diuretics continue, clear chest, baseline edema and renal function.  Discharged for further as outpatient.  Day of Discharge Exam BP 148/74  Pulse 82  Temp(Src) 98.4 F (36.9 C) (Oral)  Resp 20  Ht 6\' 3"  (1.905 m)  Wt 145.605 kg (321 lb)  BMI 40.12 kg/m2  SpO2 97%  Physical Exam: General appearance: alert, cooperative and no distress Eyes: no scleral icterus Throat: oropharynx moist without erythema Resp: clear to auscultation bilaterally Cardio: regular rate and rhythm, S1, S2 normal, no murmur, click, rub or gallop Extremities: no clubbing, cyanosis, 1 plus edema, venous changes.  Discharge Labs:  Recent Labs  12/28/12 0524 12/29/12 0435  NA 139 140  K 3.6 3.3*  CL 101 104  CO2 28  25  GLUCOSE 211* 80  BUN 36* 34*  CREATININE 3.71* 3.50*  CALCIUM 8.5 8.8    Recent Labs  12/28/12 0524  AST 21  ALT 13  ALKPHOS 105  BILITOT 0.3  PROT 6.4  ALBUMIN 2.6*    Recent Labs  12/28/12 0524 12/29/12 0435  WBC 3.6* 4.5  HGB 9.7* 10.4*  HCT 29.7* 31.0*  MCV 79.2 78.5  PLT 203 253    Recent Labs  12/27/12 1712 12/27/12 2245 12/28/12 0524  TROPONINI <0.30 <0.30 <0.30   No results found for this basename: TSH, T4TOTAL, FREET3, T3FREE, THYROIDAB,  in the last 72 hours  Recent Labs  12/28/12 0524  VITAMINB12 992*  FOLATE >20.0  FERRITIN 356*  TIBC 201*  IRON 16*  RETICCTPCT 1.2    Discharge instructions:     Discharge Orders   Future Appointments Provider Department Dept Phone   03/23/2013 12:15 PM Hayden Pedro, MD [place] [phone]   Future Orders Complete By Expires     Diet - low sodium heart healthy  As directed     Increase activity slowly  As directed        Disposition: home1  Follow-up Appts: Follow-up with Dr. Reynaldo Minium at [facility] in 1 week.  Call for appointment.  Condition on  Discharge: stable  Tests Needing Follow-up: Inr, bmet, cardiology  Signed: Shevawn Langenberg A 12/29/2012, 5:04 PM

## 2012-12-29 NOTE — Plan of Care (Signed)
Problem: Phase I Progression Outcomes Goal: Anginal pain relieved Outcome: Completed/Met Date Met:  12/29/12 Pt has not had any c/o pain Goal: Voiding-avoid urinary catheter unless indicated Outcome: Completed/Met Date Met:  12/29/12 Pt voiding independently

## 2012-12-29 NOTE — Progress Notes (Signed)
SATURATION QUALIFICATIONS: (This note is used to comply with regulatory documentation for home oxygen)  Patient Saturations on Room Air at Rest = 96%  Patient Saturations on Room Air while Ambulating = 91%  Patient Saturations on room air oxygen while Ambulating = 91-95%  Please briefly explain why patient needs home oxygen:pt ambulated 100 ft in hallway, pt O2 on room air at rest 96% lowest O2 while ambulating was 91%, pt tolerated well

## 2013-01-02 LAB — CULTURE, BLOOD (ROUTINE X 2)
Culture: NO GROWTH
Culture: NO GROWTH

## 2013-01-06 ENCOUNTER — Other Ambulatory Visit (HOSPITAL_COMMUNITY): Payer: Self-pay

## 2013-01-09 ENCOUNTER — Encounter (HOSPITAL_COMMUNITY)
Admission: RE | Admit: 2013-01-09 | Discharge: 2013-01-09 | Disposition: A | Discharge status: Home / Self Care | Payer: Medicare Other | Source: Ambulatory Visit | Attending: Nephrology | Admitting: Nephrology

## 2013-01-09 DIAGNOSIS — R609 Edema, unspecified: Secondary | ICD-10-CM | POA: Diagnosis not present

## 2013-01-09 DIAGNOSIS — D638 Anemia in other chronic diseases classified elsewhere: Secondary | ICD-10-CM | POA: Insufficient documentation

## 2013-01-09 DIAGNOSIS — I129 Hypertensive chronic kidney disease with stage 1 through stage 4 chronic kidney disease, or unspecified chronic kidney disease: Secondary | ICD-10-CM | POA: Diagnosis not present

## 2013-01-09 DIAGNOSIS — E119 Type 2 diabetes mellitus without complications: Secondary | ICD-10-CM | POA: Diagnosis not present

## 2013-01-09 DIAGNOSIS — N184 Chronic kidney disease, stage 4 (severe): Secondary | ICD-10-CM | POA: Diagnosis not present

## 2013-01-09 LAB — RENAL FUNCTION PANEL
Albumin: 3.1 g/dL — ABNORMAL LOW (ref 3.5–5.2)
BUN: 46 mg/dL — ABNORMAL HIGH (ref 6–23)
CO2: 24 mEq/L (ref 19–32)
Calcium: 9.4 mg/dL (ref 8.4–10.5)
Chloride: 101 mEq/L (ref 96–112)
Creatinine, Ser: 3.94 mg/dL — ABNORMAL HIGH (ref 0.50–1.35)
GFR calc Af Amer: 18 mL/min — ABNORMAL LOW (ref >90)
GFR calc non Af Amer: 15 mL/min — ABNORMAL LOW (ref >90)
Glucose, Bld: 264 mg/dL — ABNORMAL HIGH (ref 70–99)
Phosphorus: 4.4 mg/dL (ref 2.3–4.6)
Potassium: 4.2 mEq/L (ref 3.5–5.1)
Sodium: 138 mEq/L (ref 135–145)

## 2013-01-09 LAB — IRON AND TIBC
Iron: 44 ug/dL (ref 42–135)
Saturation Ratios: 16 % — ABNORMAL LOW (ref 20–55)
TIBC: 283 ug/dL (ref 215–435)
UIBC: 239 ug/dL (ref 125–400)

## 2013-01-09 LAB — HEMOGLOBIN AND HEMATOCRIT, BLOOD
HCT: 31.2 % — ABNORMAL LOW (ref 39.0–52.0)
Hemoglobin: 10.6 g/dL — ABNORMAL LOW (ref 13.0–17.0)

## 2013-01-09 LAB — FERRITIN: Ferritin: 378 ng/mL — ABNORMAL HIGH (ref 22–322)

## 2013-01-09 LAB — MAGNESIUM: Magnesium: 2.1 mg/dL (ref 1.5–2.5)

## 2013-01-09 MED ORDER — EPOETIN ALFA 20000 UNIT/ML IJ SOLN
INTRAMUSCULAR | Status: AC
  Administered 2013-01-09: 20000 [IU] via SUBCUTANEOUS
  Filled 2013-01-09: qty 1

## 2013-01-09 MED ORDER — EPOETIN ALFA 20000 UNIT/ML IJ SOLN: 20000.0000 [IU] | INTRAMUSCULAR | Status: DC

## 2013-01-10 LAB — VITAMIN D 25 HYDROXY (VIT D DEFICIENCY, FRACTURES): Vit D, 25-Hydroxy: 19 ng/mL — ABNORMAL LOW (ref 30–89)

## 2013-01-10 LAB — PTH, INTACT AND CALCIUM
Calcium, Total (PTH): 9.1 mg/dL (ref 8.4–10.5)
PTH: 210 pg/mL — ABNORMAL HIGH (ref 14.0–72.0)

## 2013-01-13 ENCOUNTER — Other Ambulatory Visit: Payer: Self-pay | Admitting: *Deleted

## 2013-01-13 DIAGNOSIS — Z01810 Encounter for preprocedural cardiovascular examination: Secondary | ICD-10-CM

## 2013-01-13 DIAGNOSIS — N184 Chronic kidney disease, stage 4 (severe): Secondary | ICD-10-CM

## 2013-01-20 ENCOUNTER — Other Ambulatory Visit (HOSPITAL_COMMUNITY): Payer: Self-pay

## 2013-01-23 ENCOUNTER — Encounter (HOSPITAL_COMMUNITY)
Admission: RE | Admit: 2013-01-23 | Discharge: 2013-01-23 | Disposition: A | Discharge status: Home / Self Care | Payer: Medicare Other | Source: Ambulatory Visit | Attending: Nephrology | Admitting: Nephrology

## 2013-01-23 DIAGNOSIS — D638 Anemia in other chronic diseases classified elsewhere: Secondary | ICD-10-CM | POA: Diagnosis not present

## 2013-01-23 DIAGNOSIS — N184 Chronic kidney disease, stage 4 (severe): Secondary | ICD-10-CM | POA: Diagnosis not present

## 2013-01-23 LAB — POCT HEMOGLOBIN-HEMACUE: Hemoglobin: 10.3 g/dL — ABNORMAL LOW (ref 13.0–17.0)

## 2013-01-23 MED ORDER — EPOETIN ALFA 20000 UNIT/ML IJ SOLN: 20000.0000 [IU] | INTRAMUSCULAR | Status: DC

## 2013-01-23 MED ORDER — SODIUM CHLORIDE 0.9 % IV SOLN
1020.0000 mg | Freq: Once | INTRAVENOUS | Status: AC
  Administered 2013-01-23: 1020 mg via INTRAVENOUS
  Filled 2013-01-23: qty 34

## 2013-01-23 MED ORDER — EPOETIN ALFA 20000 UNIT/ML IJ SOLN
INTRAMUSCULAR | Status: AC
  Administered 2013-01-23: 20000 [IU] via SUBCUTANEOUS
  Filled 2013-01-23: qty 1

## 2013-01-27 ENCOUNTER — Encounter: Payer: Self-pay | Admitting: Vascular Surgery

## 2013-01-31 ENCOUNTER — Ambulatory Visit (INDEPENDENT_AMBULATORY_CARE_PROVIDER_SITE_OTHER): Payer: Medicare Other | Admitting: Vascular Surgery

## 2013-01-31 ENCOUNTER — Encounter (INDEPENDENT_AMBULATORY_CARE_PROVIDER_SITE_OTHER): Payer: Medicare Other | Admitting: *Deleted

## 2013-01-31 ENCOUNTER — Encounter (HOSPITAL_COMMUNITY): Payer: Self-pay | Admitting: Pharmacy Technician

## 2013-01-31 ENCOUNTER — Other Ambulatory Visit: Payer: Self-pay

## 2013-01-31 ENCOUNTER — Encounter: Payer: Self-pay | Admitting: Vascular Surgery

## 2013-01-31 VITALS — BP 161/87 | HR 79 | Ht 72.0 in | Wt 312.0 lb

## 2013-01-31 DIAGNOSIS — Z01810 Encounter for preprocedural cardiovascular examination: Secondary | ICD-10-CM

## 2013-01-31 DIAGNOSIS — N186 End stage renal disease: Secondary | ICD-10-CM | POA: Diagnosis not present

## 2013-01-31 DIAGNOSIS — N184 Chronic kidney disease, stage 4 (severe): Secondary | ICD-10-CM | POA: Diagnosis not present

## 2013-01-31 NOTE — Progress Notes (Signed)
Subjective:     Patient ID: [name], male   DOB: 17-Sep-1950, 62 y.o.   MRN: [id number]  HPI this 62 year old male was referred for evaluation of vascular access . He has end-stage renal disease. He has never had excess before and is right-handed. He does have diabetes mellitus-type I.  Past Medical History  Diagnosis Date  . Cancer   . DVT (deep venous thrombosis)     Right leg  . Diabetic retinopathy   . Hypertension   . Hyperlipidemia   . Non Hodgkin's lymphoma     Tx 2009  . History of cardiac catheterization     pt denies this on 03/15/2012  . Nodular lymphoma of intra-abdominal lymph nodes   . Shortness of breath   . Sleep apnea     "suppose to have a sleep studfy, but they never told me when.  . Peripheral vascular disease   . Blood transfusion   . 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  Marland Kitchen Headache(784.0)   . Noncompliance 03/16/2012  . CKD (chronic kidney disease) stage 4, GFR 15-29 ml/min 03/18/2012  . NSVT (nonsustained ventricular tachycardia) 03/18/2012  . Family history of anesthesia complication     " my son wakes up slowly"  . Pneumonia     hosp.- 2013  . Anemia   . GERD (gastroesophageal reflux disease)     uses alka seltzere on occas.   . Arthritis     HNP- lumbar  . Diabetes mellitus     insulin dependent    History  Substance Use Topics  . Smoking status: Never Smoker   . Smokeless tobacco: Never Used  . Alcohol Use: No    Family History  Problem Relation Age of Onset  . Anesthesia problems Son   . Hypertension Son   . Diabetes Father   . Hypertension Father   . Other Father     amputation    No Known Allergies  Current outpatient prescriptions:amLODipine (NORVASC) 10 MG tablet, Take 1 tablet (10 mg total) by mouth at bedtime., Disp: 30 tablet, Rfl: 6;  calcitRIOL (ROCALTROL) 0.25 MCG capsule, Take 0.25 mcg by mouth daily.  , Disp: , Rfl: ;  carvedilol (COREG) 25 MG tablet, Take 25 mg by mouth 2 (two) times daily with  a meal., Disp: , Rfl: ;  furosemide (LASIX) 80 MG tablet, Take 80 mg by mouth 2 (two) times daily., Disp: , Rfl:  insulin glargine (LANTUS) 100 UNIT/ML injection, Inject 60 Units into the skin at bedtime., Disp: , Rfl: ;  insulin lispro (HUMALOG) 100 UNIT/ML injection, Inject 10 Units into the skin 2 (two) times daily., Disp: , Rfl: ;  KLOR-CON M20 20 MEQ tablet, Take 20 mg by mouth Daily., Disp: , Rfl: ;  levofloxacin (LEVAQUIN) 250 MG tablet, Take 1 tablet (250 mg total) by mouth daily., Disp: 10 tablet, Rfl: 0 methocarbamol (ROBAXIN) 500 MG tablet, Take 500 mg by mouth 2 (two) times daily as needed (for muscle spasms)., Disp: , Rfl: ;  polysaccharide iron (NIFEREX) 150 MG CAPS capsule, Take 1 capsule (150 mg total) by mouth daily., Disp: 30 each, Rfl: 2;  traMADol (ULTRAM) 50 MG tablet, Take 50 mg by mouth every 6 (six) hours as needed for pain., Disp: , Rfl:  warfarin (COUMADIN) 5 MG tablet, Take 5-10 mg by mouth daily. Take 10mg  daily except 5mg  on Mondays and Fridays, Disp: , Rfl:   BP 161/87  Pulse 79  Ht 6' (1.829  m)  Wt 312 lb (141.522 kg)  BMI 42.31 kg/m2  SpO2 95%  Body mass index is 42.31 kg/(m^2).           Review of Systems denies chest pain but does have dyspnea on exertion, orthopnea, history of DVT, swelling in legs, productive cough. Other systems negative and complete review of systems     Objective:   Physical Exam BP 161/87  Pulse 79  Ht 6' (1.829 m)  Wt 312 lb (141.522 kg)  BMI 42.31 kg/m2  SpO2 95%  Gen.-alert and oriented x3 in no apparent distress HEENT normal for age Lungs no rhonchi or wheezing Cardiovascular regular rhythm no murmurs carotid pulses 3+ palpable no bruits audible Abdomen soft nontender no palpable masses Musculoskeletal free of  major deformities-obese Skin clear -no rashes Neurologic normal Lower extremities 3+ femoral and dorsalis pedis pulses palpable bilaterally with no edema  Left upper extremity with 3+ brachial and radial  pulse palpable. Cephalic vein appears adequate from wrist to antecubital area.  Today I ordered bilateral upper tremor vein mapping which are reviewed and interpreted. Upper arm cephalic veins are absent. The left forearm cephalic vein appears adequate for fistula creation as does the left basilic vein       Assessment:     End-stage renal disease needs vascular access-patient on chronic Coumadin    Plan:     Plan hold Coumadin for 2 days and plan left radial to cephalic AV fistula on Friday, September 5-risks and benefits and potential failure discussed with patient and he understands and would like proceed

## 2013-02-01 ENCOUNTER — Other Ambulatory Visit (HOSPITAL_COMMUNITY): Payer: Self-pay | Admitting: *Deleted

## 2013-02-01 ENCOUNTER — Encounter (HOSPITAL_COMMUNITY)
Admission: RE | Admit: 2013-02-01 | Discharge: 2013-02-01 | Disposition: A | Discharge status: Home / Self Care | Payer: Medicare Other | Source: Ambulatory Visit | Attending: Vascular Surgery | Admitting: Vascular Surgery

## 2013-02-01 ENCOUNTER — Encounter (HOSPITAL_COMMUNITY): Payer: Self-pay

## 2013-02-01 DIAGNOSIS — I120 Hypertensive chronic kidney disease with stage 5 chronic kidney disease or end stage renal disease: Secondary | ICD-10-CM | POA: Diagnosis not present

## 2013-02-01 DIAGNOSIS — E669 Obesity, unspecified: Secondary | ICD-10-CM | POA: Diagnosis not present

## 2013-02-01 DIAGNOSIS — I4729 Other ventricular tachycardia: Secondary | ICD-10-CM | POA: Diagnosis not present

## 2013-02-01 DIAGNOSIS — Z7901 Long term (current) use of anticoagulants: Secondary | ICD-10-CM | POA: Diagnosis not present

## 2013-02-01 DIAGNOSIS — I4510 Unspecified right bundle-branch block: Secondary | ICD-10-CM | POA: Diagnosis not present

## 2013-02-01 DIAGNOSIS — Z86718 Personal history of other venous thrombosis and embolism: Secondary | ICD-10-CM | POA: Diagnosis not present

## 2013-02-01 DIAGNOSIS — Z9119 Patient's noncompliance with other medical treatment and regimen: Secondary | ICD-10-CM | POA: Diagnosis not present

## 2013-02-01 DIAGNOSIS — D649 Anemia, unspecified: Secondary | ICD-10-CM | POA: Diagnosis not present

## 2013-02-01 DIAGNOSIS — E1039 Type 1 diabetes mellitus with other diabetic ophthalmic complication: Secondary | ICD-10-CM | POA: Diagnosis not present

## 2013-02-01 DIAGNOSIS — Z79899 Other long term (current) drug therapy: Secondary | ICD-10-CM | POA: Diagnosis not present

## 2013-02-01 DIAGNOSIS — Z91199 Patient's noncompliance with other medical treatment and regimen due to unspecified reason: Secondary | ICD-10-CM | POA: Diagnosis not present

## 2013-02-01 DIAGNOSIS — I739 Peripheral vascular disease, unspecified: Secondary | ICD-10-CM | POA: Diagnosis not present

## 2013-02-01 DIAGNOSIS — Z8701 Personal history of pneumonia (recurrent): Secondary | ICD-10-CM | POA: Diagnosis not present

## 2013-02-01 DIAGNOSIS — K219 Gastro-esophageal reflux disease without esophagitis: Secondary | ICD-10-CM | POA: Diagnosis not present

## 2013-02-01 DIAGNOSIS — E785 Hyperlipidemia, unspecified: Secondary | ICD-10-CM | POA: Diagnosis not present

## 2013-02-01 DIAGNOSIS — C8293 Follicular lymphoma, unspecified, intra-abdominal lymph nodes: Secondary | ICD-10-CM | POA: Diagnosis not present

## 2013-02-01 DIAGNOSIS — Z6841 Body Mass Index (BMI) 40.0 and over, adult: Secondary | ICD-10-CM | POA: Diagnosis not present

## 2013-02-01 DIAGNOSIS — E1029 Type 1 diabetes mellitus with other diabetic kidney complication: Secondary | ICD-10-CM | POA: Diagnosis not present

## 2013-02-01 DIAGNOSIS — I472 Ventricular tachycardia: Secondary | ICD-10-CM | POA: Diagnosis not present

## 2013-02-01 DIAGNOSIS — E11319 Type 2 diabetes mellitus with unspecified diabetic retinopathy without macular edema: Secondary | ICD-10-CM | POA: Diagnosis not present

## 2013-02-01 DIAGNOSIS — N186 End stage renal disease: Secondary | ICD-10-CM | POA: Diagnosis not present

## 2013-02-01 LAB — PROTIME-INR
INR: 2.09 — ABNORMAL HIGH (ref 0.00–1.49)
Prothrombin Time: 22.8 seconds — ABNORMAL HIGH (ref 11.6–15.2)

## 2013-02-01 LAB — APTT: aPTT: 46 seconds — ABNORMAL HIGH (ref 24–37)

## 2013-02-01 NOTE — Progress Notes (Signed)
62 year male with ESRD has not started HD for L radial-cephalic fistula on 9/5 by Dr. Kellie Simmering.  Problems:  ESRD  Type 2 DM   H/O LE DVT on coumadin Htn H/O Non-Hodgkins Lymphoma treated in 2009 Hospitalized 12/28/12 with atypical chest pain, MI ruled out. Symptoms felt to be due to pneumonia  ECG shows SR with RBBB Last ECHO 12/28/12 LV EF  55-60% with mild concentric LVH  Will evaluate patient prior to surgery  Roberts Gaudy, MD

## 2013-02-01 NOTE — Pre-Procedure Instructions (Signed)
DVANTE ROGIER  02/01/2013   Your procedure is scheduled on:  February 03, 2013 at 7:30 AM  Report to [facility] at 5:30 AM.  Call this number if you have problems the morning of surgery: [phone]   Remember:   Do not eat food or drink liquids after midnight.   Take these medicines the morning of surgery with A SIP OF WATER: amLODipine (NORVASC), carvedilol (COREG), traMADol (ULTRAM) - if needed.  Stop Coumadin as directed by Dr. Evelena Leyden office.             Do not wear jewelry, make-up or nail polish.  Do not wear lotions, powders, or perfumes. You may wear deodorant.  Do not shave 48 hours prior to surgery. Men may shave face and neck.  Do not bring valuables to the hospital.  [facility] is not responsible                   for any belongings or valuables.  Contacts, dentures or bridgework may not be worn into surgery.  Leave suitcase in the car. After surgery it may be brought to your room.  For patients admitted to the hospital, checkout time is 11:00 AM the day of  discharge.   Patients discharged the day of surgery will not be allowed to drive  home.  Name and phone number of your driver: Family/friend  Special Instructions: Shower using CHG 2 nights before surgery and the night before surgery.  If you shower the day of surgery use CHG.  Use special wash - you have one bottle of CHG for all showers.  You should use approximately 1/3 of the bottle for each shower.   Please read over the following fact sheets that you were given: Pain Booklet, Coughing and Deep Breathing and Surgical Site Infection Prevention

## 2013-02-01 NOTE — Progress Notes (Signed)
02/01/13 1130  OBSTRUCTIVE SLEEP APNEA  Have you ever been diagnosed with sleep apnea through a sleep study? No  Do you snore loudly (loud enough to be heard through closed doors)?  0  Do you often feel tired, fatigued, or sleepy during the daytime? 1  Has anyone observed you stop breathing during your sleep? 0  Do you have, or are you being treated for high blood pressure? 1  BMI more than 35 kg/m2? 1  Age over 60 years old? 1  Neck circumference greater than 40 cm/18 inches? 1  Gender: 1  Obstructive Sleep Apnea Score 6  Score 4 or greater  Results sent to PCP

## 2013-02-02 MED ORDER — DEXTROSE 5 % IV SOLN
1.5000 g | INTRAVENOUS | Status: AC
  Administered 2013-02-03: 1.5 g via INTRAVENOUS
  Filled 2013-02-02: qty 1.5

## 2013-02-03 ENCOUNTER — Ambulatory Visit (HOSPITAL_COMMUNITY): Payer: Medicare Other | Admitting: Anesthesiology

## 2013-02-03 ENCOUNTER — Ambulatory Visit (HOSPITAL_COMMUNITY)
Admission: RE | Admit: 2013-02-03 | Discharge: 2013-02-03 | Disposition: A | Discharge status: Home / Self Care | Payer: Medicare Other | Source: Ambulatory Visit | Attending: Vascular Surgery | Admitting: Vascular Surgery

## 2013-02-03 ENCOUNTER — Telehealth: Payer: Self-pay | Admitting: Vascular Surgery

## 2013-02-03 ENCOUNTER — Other Ambulatory Visit: Payer: Self-pay | Admitting: *Deleted

## 2013-02-03 ENCOUNTER — Encounter (HOSPITAL_COMMUNITY): Payer: Self-pay | Admitting: Anesthesiology

## 2013-02-03 ENCOUNTER — Encounter (HOSPITAL_COMMUNITY): Payer: Self-pay | Admitting: Surgery

## 2013-02-03 ENCOUNTER — Encounter (HOSPITAL_COMMUNITY)
Admission: RE | Disposition: A | Discharge status: Home / Self Care | Payer: Self-pay | Source: Ambulatory Visit | Attending: Vascular Surgery

## 2013-02-03 DIAGNOSIS — E11319 Type 2 diabetes mellitus with unspecified diabetic retinopathy without macular edema: Secondary | ICD-10-CM | POA: Insufficient documentation

## 2013-02-03 DIAGNOSIS — D649 Anemia, unspecified: Secondary | ICD-10-CM | POA: Diagnosis not present

## 2013-02-03 DIAGNOSIS — E1029 Type 1 diabetes mellitus with other diabetic kidney complication: Secondary | ICD-10-CM | POA: Insufficient documentation

## 2013-02-03 DIAGNOSIS — K219 Gastro-esophageal reflux disease without esophagitis: Secondary | ICD-10-CM | POA: Insufficient documentation

## 2013-02-03 DIAGNOSIS — Z6841 Body Mass Index (BMI) 40.0 and over, adult: Secondary | ICD-10-CM | POA: Insufficient documentation

## 2013-02-03 DIAGNOSIS — I472 Ventricular tachycardia, unspecified: Secondary | ICD-10-CM | POA: Insufficient documentation

## 2013-02-03 DIAGNOSIS — I4510 Unspecified right bundle-branch block: Secondary | ICD-10-CM | POA: Insufficient documentation

## 2013-02-03 DIAGNOSIS — I739 Peripheral vascular disease, unspecified: Secondary | ICD-10-CM | POA: Insufficient documentation

## 2013-02-03 DIAGNOSIS — Z9119 Patient's noncompliance with other medical treatment and regimen: Secondary | ICD-10-CM | POA: Insufficient documentation

## 2013-02-03 DIAGNOSIS — I120 Hypertensive chronic kidney disease with stage 5 chronic kidney disease or end stage renal disease: Secondary | ICD-10-CM | POA: Insufficient documentation

## 2013-02-03 DIAGNOSIS — E669 Obesity, unspecified: Secondary | ICD-10-CM | POA: Insufficient documentation

## 2013-02-03 DIAGNOSIS — N186 End stage renal disease: Secondary | ICD-10-CM | POA: Insufficient documentation

## 2013-02-03 DIAGNOSIS — Z91199 Patient's noncompliance with other medical treatment and regimen due to unspecified reason: Secondary | ICD-10-CM | POA: Insufficient documentation

## 2013-02-03 DIAGNOSIS — E1039 Type 1 diabetes mellitus with other diabetic ophthalmic complication: Secondary | ICD-10-CM | POA: Insufficient documentation

## 2013-02-03 DIAGNOSIS — I10 Essential (primary) hypertension: Secondary | ICD-10-CM | POA: Diagnosis not present

## 2013-02-03 DIAGNOSIS — Z86718 Personal history of other venous thrombosis and embolism: Secondary | ICD-10-CM | POA: Insufficient documentation

## 2013-02-03 DIAGNOSIS — C8293 Follicular lymphoma, unspecified, intra-abdominal lymph nodes: Secondary | ICD-10-CM | POA: Insufficient documentation

## 2013-02-03 DIAGNOSIS — Z8701 Personal history of pneumonia (recurrent): Secondary | ICD-10-CM | POA: Insufficient documentation

## 2013-02-03 DIAGNOSIS — Z48812 Encounter for surgical aftercare following surgery on the circulatory system: Secondary | ICD-10-CM

## 2013-02-03 DIAGNOSIS — N19 Unspecified kidney failure: Secondary | ICD-10-CM

## 2013-02-03 DIAGNOSIS — I4729 Other ventricular tachycardia: Secondary | ICD-10-CM | POA: Insufficient documentation

## 2013-02-03 DIAGNOSIS — Z7901 Long term (current) use of anticoagulants: Secondary | ICD-10-CM | POA: Insufficient documentation

## 2013-02-03 DIAGNOSIS — Z79899 Other long term (current) drug therapy: Secondary | ICD-10-CM | POA: Insufficient documentation

## 2013-02-03 DIAGNOSIS — E785 Hyperlipidemia, unspecified: Secondary | ICD-10-CM | POA: Insufficient documentation

## 2013-02-03 HISTORY — PX: AV FISTULA PLACEMENT: SHX1204

## 2013-02-03 LAB — POCT I-STAT 4, (NA,K, GLUC, HGB,HCT)
Glucose, Bld: 144 mg/dL — ABNORMAL HIGH (ref 70–99)
HCT: 35 % — ABNORMAL LOW (ref 39.0–52.0)
Hemoglobin: 11.9 g/dL — ABNORMAL LOW (ref 13.0–17.0)
Potassium: 3.3 mEq/L — ABNORMAL LOW (ref 3.5–5.1)
Sodium: 142 mEq/L (ref 135–145)

## 2013-02-03 LAB — GLUCOSE, CAPILLARY: Glucose-Capillary: 92 mg/dL (ref 70–99)

## 2013-02-03 SURGERY — ARTERIOVENOUS (AV) FISTULA CREATION
Anesthesia: Monitor Anesthesia Care | Site: Arm Lower | Laterality: Left | Wound class: Clean

## 2013-02-03 MED ORDER — FENTANYL CITRATE 0.05 MG/ML IJ SOLN
INTRAMUSCULAR | Status: DC | PRN
  Administered 2013-02-03: 100 ug via INTRAVENOUS

## 2013-02-03 MED ORDER — SODIUM CHLORIDE 0.9 % IV SOLN
INTRAVENOUS | Status: DC
  Administered 2013-02-03: 07:00:00 via INTRAVENOUS

## 2013-02-03 MED ORDER — LIDOCAINE-EPINEPHRINE (PF) 1 %-1:200000 IJ SOLN
INTRAMUSCULAR | Status: DC | PRN
  Administered 2013-02-03: 30 mL

## 2013-02-03 MED ORDER — LIDOCAINE-EPINEPHRINE (PF) 1 %-1:200000 IJ SOLN
INTRAMUSCULAR | Status: AC
  Filled 2013-02-03: qty 10

## 2013-02-03 MED ORDER — SODIUM CHLORIDE 0.9 % IR SOLN
Status: DC | PRN
  Administered 2013-02-03: 08:00:00

## 2013-02-03 MED ORDER — 0.9 % SODIUM CHLORIDE (POUR BTL) OPTIME
TOPICAL | Status: DC | PRN
  Administered 2013-02-03: 1000 mL

## 2013-02-03 MED ORDER — MIDAZOLAM HCL 5 MG/5ML IJ SOLN
INTRAMUSCULAR | Status: DC | PRN
  Administered 2013-02-03: 2 mg via INTRAVENOUS

## 2013-02-03 MED ORDER — OXYCODONE-ACETAMINOPHEN 5-325 MG PO TABS: 1.0000 | ORAL_TABLET | Freq: Four times a day (QID) | ORAL | Status: DC | PRN

## 2013-02-03 MED ORDER — PROPOFOL INFUSION 10 MG/ML OPTIME
INTRAVENOUS | Status: DC | PRN
  Administered 2013-02-03: 100 ug/kg/min via INTRAVENOUS

## 2013-02-03 SURGICAL SUPPLY — 39 items
ADH SKN CLS APL DERMABOND .7 (GAUZE/BANDAGES/DRESSINGS) ×1
ARMBAND PINK RESTRICT EXTREMIT (MISCELLANEOUS) ×2 IMPLANT
CANISTER SUCTION 2500CC (MISCELLANEOUS) ×2 IMPLANT
CLIP TI MEDIUM 6 (CLIP) ×2 IMPLANT
CLIP TI WIDE RED SMALL 6 (CLIP) ×3 IMPLANT
CLOTH BEACON ORANGE TIMEOUT ST (SAFETY) ×2 IMPLANT
COVER PROBE W GEL 5X96 (DRAPES) IMPLANT
COVER SURGICAL LIGHT HANDLE (MISCELLANEOUS) ×2 IMPLANT
DERMABOND ADVANCED (GAUZE/BANDAGES/DRESSINGS) ×1
DERMABOND ADVANCED .7 DNX12 (GAUZE/BANDAGES/DRESSINGS) ×1 IMPLANT
ELECT REM PT RETURN 9FT ADLT (ELECTROSURGICAL) ×2
ELECTRODE REM PT RTRN 9FT ADLT (ELECTROSURGICAL) ×1 IMPLANT
GEL ULTRASOUND 20GR AQUASONIC (MISCELLANEOUS) ×1 IMPLANT
GLOVE BIO SURGEON STRL SZ7 (GLOVE) IMPLANT
GLOVE BIOGEL PI IND STRL 6.5 (GLOVE) IMPLANT
GLOVE BIOGEL PI IND STRL 7.5 (GLOVE) IMPLANT
GLOVE BIOGEL PI IND STRL 8 (GLOVE) IMPLANT
GLOVE BIOGEL PI INDICATOR 6.5 (GLOVE) ×3
GLOVE BIOGEL PI INDICATOR 7.5 (GLOVE) ×1
GLOVE BIOGEL PI INDICATOR 8 (GLOVE) ×1
GLOVE SS BIOGEL STRL SZ 7 (GLOVE) ×1 IMPLANT
GLOVE SUPERSENSE BIOGEL SZ 7 (GLOVE) ×2
GLOVE SURG SS PI 6.5 STRL IVOR (GLOVE) ×2 IMPLANT
GOWN STRL NON-REIN LRG LVL3 (GOWN DISPOSABLE) ×6 IMPLANT
KIT BASIN OR (CUSTOM PROCEDURE TRAY) ×2 IMPLANT
KIT ROOM TURNOVER OR (KITS) ×2 IMPLANT
NDL HYPO 25GX1X1/2 BEV (NEEDLE) ×1 IMPLANT
NEEDLE HYPO 25GX1X1/2 BEV (NEEDLE) ×2 IMPLANT
NS IRRIG 1000ML POUR BTL (IV SOLUTION) ×2 IMPLANT
PACK CV ACCESS (CUSTOM PROCEDURE TRAY) ×2 IMPLANT
PAD ARMBOARD 7.5X6 YLW CONV (MISCELLANEOUS) ×4 IMPLANT
PENCIL BUTTON HOLSTER BLD 10FT (ELECTRODE) ×1 IMPLANT
SUT PROLENE 6 0 BV (SUTURE) ×2 IMPLANT
SUT VIC AB 3-0 SH 27 (SUTURE) ×2
SUT VIC AB 3-0 SH 27X BRD (SUTURE) ×1 IMPLANT
TOWEL OR 17X24 6PK STRL BLUE (TOWEL DISPOSABLE) ×2 IMPLANT
TOWEL OR 17X26 10 PK STRL BLUE (TOWEL DISPOSABLE) ×2 IMPLANT
UNDERPAD 30X30 INCONTINENT (UNDERPADS AND DIAPERS) ×2 IMPLANT
WATER STERILE IRR 1000ML POUR (IV SOLUTION) ×2 IMPLANT

## 2013-02-03 NOTE — Anesthesia Postprocedure Evaluation (Signed)
  Anesthesia Post-op Note  Patient: [name]  Procedure(s) Performed: Procedure(s): ARTERIOVENOUS (AV) FISTULA CREATION- LEFT RADIAL CEPHALIC; ULTRASOUND GUIDED (Left)  Patient Location: PACU  Anesthesia Type:General  Level of Consciousness: awake  Airway and Oxygen Therapy: Patient Spontanous Breathing  Post-op Pain: mild  Post-op Assessment: Post-op Vital signs reviewed  Post-op Vital Signs: Reviewed  Complications: No apparent anesthesia complications

## 2013-02-03 NOTE — Telephone Encounter (Signed)
No answer no vm on home phone, lvm on cell, sent letter - kf

## 2013-02-03 NOTE — Telephone Encounter (Signed)
Message copied by Berniece Salines on Fri Feb 03, 2013 10:25 AM ------      Message from: Alfonso Patten      Created: Fri Feb 03, 2013  9:42 AM                   ----- Message -----         From: Ulyses Amor, PA-C         Sent: 02/03/2013   8:48 AM           To: Alfonso Patten, RN            F/U in 6 weeks with duplex left forearm av fistula Dr. Kellie Simmering ------

## 2013-02-03 NOTE — Anesthesia Preprocedure Evaluation (Addendum)
Anesthesia Evaluation  Patient identified by MRN, date of birth, ID band Patient awake    Reviewed: Allergy & Precautions, H&P , NPO status , Patient's Chart, lab work & pertinent test results  Airway Mallampati: II      Dental   Pulmonary          Cardiovascular hypertension, + Peripheral Vascular Disease + dysrhythmias     Neuro/Psych  Headaches,    GI/Hepatic GERD-  ,  Endo/Other  diabetes  Renal/GU Renal disease     Musculoskeletal   Abdominal   Peds  Hematology   Anesthesia Other Findings   Reproductive/Obstetrics                          Anesthesia Physical Anesthesia Plan  ASA: III  Anesthesia Plan: MAC   Post-op Pain Management:    Induction: Intravenous  Airway Management Planned: Simple Face Mask  Additional Equipment:   Intra-op Plan:   Post-operative Plan:   Informed Consent:   Dental advisory given  Plan Discussed with: CRNA, Anesthesiologist and Surgeon  Anesthesia Plan Comments:         Anesthesia Quick Evaluation

## 2013-02-03 NOTE — Interval H&P Note (Signed)
History and Physical Interval Note:  02/03/2013 7:33 AM  [name]  has presented today for surgery, with the diagnosis of End Stage Renal Disease  The various methods of treatment have been discussed with the patient and family. After consideration of risks, benefits and other options for treatment, the patient has consented to  Procedure(s): ARTERIOVENOUS (AV) FISTULA CREATION- LEFT RADIAL CEPHALIC (Left) as a surgical intervention .  The patient's history has been reviewed, patient examined, no change in status, stable for surgery.  I have reviewed the patient's chart and labs.  Questions were answered to the patient's satisfaction.     Tinnie Gens

## 2013-02-03 NOTE — Preoperative (Signed)
Beta Blockers   Reason not to administer Beta Blockers:Not Applicable 

## 2013-02-03 NOTE — Op Note (Signed)
OPERATIVE REPORT  Date of Surgery: 02/03/2013  Surgeon: Tinnie Gens, MD  Assistant: Gerri Lins PA  Pre-op Diagnosis: End Stage Renal Disease  Post-op Diagnosis: End Stage Renal Disease  Procedure: Procedure(s): ARTERIOVENOUS (AV) FISTULA CREATION- LEFT RADIAL CEPHALIC; ULTRASOUND GUIDED  Anesthesia: MAC  EBL: Minimal  Complications: None  Procedure Details: Patient was taken to the operating room placed in supine position at which time the left upper extremity was imaged with B-mode ultrasound. The cephalic vein in the forearm was marked felt to be satisfactory for a radial cephalic fistula. After prepping and draping in routine sterile manner short incision was made between the radial artery and cephalic vein just proximal to the wrist. Cephalic vein was dissected free and its branches were ligated with 30 and 4-0 silk ties and divided. The largest branch extended more laterally and was preserved. It was at least 3 mm in size. Radial artery was exposed beneath the fascia encircled with Vesseloops. An excellent pulse. Care was taken not to injury the radial nerve. Artery was occluded proximally and distally with vessel loops of with a 15 blade extended with the Potts scissors. The vein was carefully measured spatulated and anastomosed end to side with 6-0 Prolene. Clamps were then released and there was Pulse and palpable thrill in the fistula with good flow up to the antecubital area. Adequate hemostasis was achieved. No heparin was given. The wound was closed in layers with Vicryl in a subcuticular fashion with Dermabond patient taken to the recovery room in stable condition   Tinnie Gens, MD 02/03/2013 9:03 AM

## 2013-02-03 NOTE — Transfer of Care (Signed)
Immediate Anesthesia Transfer of Care Note  Patient: [name]  Procedure(s) Performed: Procedure(s): ARTERIOVENOUS (AV) FISTULA CREATION- LEFT RADIAL CEPHALIC; ULTRASOUND GUIDED (Left)  Patient Location: PACU  Anesthesia Type:MAC  Level of Consciousness: awake and alert   Airway & Oxygen Therapy: Patient Spontanous Breathing and Patient connected to nasal cannula oxygen  Post-op Assessment: Report given to PACU RN and Post -op Vital signs reviewed and stable  Post vital signs: Reviewed and stable  Complications: No apparent anesthesia complications

## 2013-02-03 NOTE — H&P (View-Only) (Signed)
Subjective:     Patient ID: [name], male   DOB: 09-14-1950, 62 y.o.   MRN: [id number]  HPI this 62 year old male was referred for evaluation of vascular access . He has end-stage renal disease. He has never had excess before and is right-handed. He does have diabetes mellitus-type I.  Past Medical History  Diagnosis Date  . Cancer   . DVT (deep venous thrombosis)     Right leg  . Diabetic retinopathy   . Hypertension   . Hyperlipidemia   . Non Hodgkin's lymphoma     Tx 2009  . History of cardiac catheterization     pt denies this on 03/15/2012  . Nodular lymphoma of intra-abdominal lymph nodes   . Shortness of breath   . Sleep apnea     "suppose to have a sleep studfy, but they never told me when.  . Peripheral vascular disease   . Blood transfusion   . 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  Marland Kitchen Headache(784.0)   . Noncompliance 03/16/2012  . CKD (chronic kidney disease) stage 4, GFR 15-29 ml/min 03/18/2012  . NSVT (nonsustained ventricular tachycardia) 03/18/2012  . Family history of anesthesia complication     " my son wakes up slowly"  . Pneumonia     hosp.- 2013  . Anemia   . GERD (gastroesophageal reflux disease)     uses alka seltzere on occas.   . Arthritis     HNP- lumbar  . Diabetes mellitus     insulin dependent    History  Substance Use Topics  . Smoking status: Never Smoker   . Smokeless tobacco: Never Used  . Alcohol Use: No    Family History  Problem Relation Age of Onset  . Anesthesia problems Son   . Hypertension Son   . Diabetes Father   . Hypertension Father   . Other Father     amputation    No Known Allergies  Current outpatient prescriptions:amLODipine (NORVASC) 10 MG tablet, Take 1 tablet (10 mg total) by mouth at bedtime., Disp: 30 tablet, Rfl: 6;  calcitRIOL (ROCALTROL) 0.25 MCG capsule, Take 0.25 mcg by mouth daily.  , Disp: , Rfl: ;  carvedilol (COREG) 25 MG tablet, Take 25 mg by mouth 2 (two) times daily with  a meal., Disp: , Rfl: ;  furosemide (LASIX) 80 MG tablet, Take 80 mg by mouth 2 (two) times daily., Disp: , Rfl:  insulin glargine (LANTUS) 100 UNIT/ML injection, Inject 60 Units into the skin at bedtime., Disp: , Rfl: ;  insulin lispro (HUMALOG) 100 UNIT/ML injection, Inject 10 Units into the skin 2 (two) times daily., Disp: , Rfl: ;  KLOR-CON M20 20 MEQ tablet, Take 20 mg by mouth Daily., Disp: , Rfl: ;  levofloxacin (LEVAQUIN) 250 MG tablet, Take 1 tablet (250 mg total) by mouth daily., Disp: 10 tablet, Rfl: 0 methocarbamol (ROBAXIN) 500 MG tablet, Take 500 mg by mouth 2 (two) times daily as needed (for muscle spasms)., Disp: , Rfl: ;  polysaccharide iron (NIFEREX) 150 MG CAPS capsule, Take 1 capsule (150 mg total) by mouth daily., Disp: 30 each, Rfl: 2;  traMADol (ULTRAM) 50 MG tablet, Take 50 mg by mouth every 6 (six) hours as needed for pain., Disp: , Rfl:  warfarin (COUMADIN) 5 MG tablet, Take 5-10 mg by mouth daily. Take 10mg  daily except 5mg  on Mondays and Fridays, Disp: , Rfl:   BP 161/87  Pulse 79  Ht 6' (1.829  m)  Wt 312 lb (141.522 kg)  BMI 42.31 kg/m2  SpO2 95%  Body mass index is 42.31 kg/(m^2).           Review of Systems denies chest pain but does have dyspnea on exertion, orthopnea, history of DVT, swelling in legs, productive cough. Other systems negative and complete review of systems     Objective:   Physical Exam BP 161/87  Pulse 79  Ht 6' (1.829 m)  Wt 312 lb (141.522 kg)  BMI 42.31 kg/m2  SpO2 95%  Gen.-alert and oriented x3 in no apparent distress HEENT normal for age Lungs no rhonchi or wheezing Cardiovascular regular rhythm no murmurs carotid pulses 3+ palpable no bruits audible Abdomen soft nontender no palpable masses Musculoskeletal free of  major deformities-obese Skin clear -no rashes Neurologic normal Lower extremities 3+ femoral and dorsalis pedis pulses palpable bilaterally with no edema  Left upper extremity with 3+ brachial and radial  pulse palpable. Cephalic vein appears adequate from wrist to antecubital area.  Today I ordered bilateral upper tremor vein mapping which are reviewed and interpreted. Upper arm cephalic veins are absent. The left forearm cephalic vein appears adequate for fistula creation as does the left basilic vein       Assessment:     End-stage renal disease needs vascular access-patient on chronic Coumadin    Plan:     Plan hold Coumadin for 2 days and plan left radial to cephalic AV fistula on Friday, September 5-risks and benefits and potential failure discussed with patient and he understands and would like proceed

## 2013-02-06 ENCOUNTER — Encounter (HOSPITAL_COMMUNITY)
Admission: RE | Admit: 2013-02-06 | Discharge: 2013-02-06 | Disposition: A | Discharge status: Home / Self Care | Payer: Medicare Other | Source: Ambulatory Visit | Attending: Nephrology | Admitting: Nephrology

## 2013-02-06 ENCOUNTER — Encounter (HOSPITAL_COMMUNITY): Payer: Self-pay | Admitting: Vascular Surgery

## 2013-02-06 DIAGNOSIS — N184 Chronic kidney disease, stage 4 (severe): Secondary | ICD-10-CM | POA: Diagnosis not present

## 2013-02-06 DIAGNOSIS — D638 Anemia in other chronic diseases classified elsewhere: Secondary | ICD-10-CM | POA: Insufficient documentation

## 2013-02-06 LAB — IRON AND TIBC
Iron: 45 ug/dL (ref 42–135)
Saturation Ratios: 17 % — ABNORMAL LOW (ref 20–55)
TIBC: 262 ug/dL (ref 215–435)
UIBC: 217 ug/dL (ref 125–400)

## 2013-02-06 LAB — FERRITIN: Ferritin: 621 ng/mL — ABNORMAL HIGH (ref 22–322)

## 2013-02-06 LAB — POCT HEMOGLOBIN-HEMACUE: Hemoglobin: 11 g/dL — ABNORMAL LOW (ref 13.0–17.0)

## 2013-02-06 MED ORDER — EPOETIN ALFA 20000 UNIT/ML IJ SOLN
INTRAMUSCULAR | Status: AC
  Filled 2013-02-06: qty 1

## 2013-02-06 MED ORDER — EPOETIN ALFA 20000 UNIT/ML IJ SOLN
20000.0000 [IU] | INTRAMUSCULAR | Status: DC
  Administered 2013-02-06: 20000 [IU] via SUBCUTANEOUS

## 2013-02-09 DIAGNOSIS — I82409 Acute embolism and thrombosis of unspecified deep veins of unspecified lower extremity: Secondary | ICD-10-CM | POA: Diagnosis not present

## 2013-02-09 DIAGNOSIS — Z7901 Long term (current) use of anticoagulants: Secondary | ICD-10-CM | POA: Diagnosis not present

## 2013-02-17 ENCOUNTER — Other Ambulatory Visit (HOSPITAL_COMMUNITY): Payer: Self-pay | Admitting: *Deleted

## 2013-02-20 ENCOUNTER — Encounter (HOSPITAL_COMMUNITY)
Admission: RE | Admit: 2013-02-20 | Discharge: 2013-02-20 | Disposition: A | Discharge status: Home / Self Care | Payer: Medicare Other | Source: Ambulatory Visit | Attending: Nephrology | Admitting: Nephrology

## 2013-02-20 DIAGNOSIS — D638 Anemia in other chronic diseases classified elsewhere: Secondary | ICD-10-CM | POA: Diagnosis not present

## 2013-02-20 DIAGNOSIS — N184 Chronic kidney disease, stage 4 (severe): Secondary | ICD-10-CM | POA: Diagnosis not present

## 2013-02-20 LAB — POCT HEMOGLOBIN-HEMACUE: Hemoglobin: 10.7 g/dL — ABNORMAL LOW (ref 13.0–17.0)

## 2013-02-20 MED ORDER — SODIUM CHLORIDE 0.9 % IV SOLN
1020.0000 mg | Freq: Once | INTRAVENOUS | Status: AC
  Administered 2013-02-20: 1020 mg via INTRAVENOUS
  Filled 2013-02-20: qty 34

## 2013-02-20 MED ORDER — FERUMOXYTOL INJECTION 510 MG/17 ML
INTRAVENOUS | Status: AC
  Filled 2013-02-20: qty 17

## 2013-02-20 MED ORDER — EPOETIN ALFA 20000 UNIT/ML IJ SOLN
INTRAMUSCULAR | Status: AC
  Filled 2013-02-20: qty 1

## 2013-02-20 MED ORDER — EPOETIN ALFA 20000 UNIT/ML IJ SOLN
20000.0000 [IU] | INTRAMUSCULAR | Status: DC
  Administered 2013-02-20: 20000 [IU] via SUBCUTANEOUS

## 2013-03-01 DIAGNOSIS — N184 Chronic kidney disease, stage 4 (severe): Secondary | ICD-10-CM | POA: Diagnosis not present

## 2013-03-01 DIAGNOSIS — I129 Hypertensive chronic kidney disease with stage 1 through stage 4 chronic kidney disease, or unspecified chronic kidney disease: Secondary | ICD-10-CM | POA: Diagnosis not present

## 2013-03-01 DIAGNOSIS — N2581 Secondary hyperparathyroidism of renal origin: Secondary | ICD-10-CM | POA: Diagnosis not present

## 2013-03-01 DIAGNOSIS — D649 Anemia, unspecified: Secondary | ICD-10-CM | POA: Diagnosis not present

## 2013-03-06 ENCOUNTER — Encounter (HOSPITAL_COMMUNITY)
Admission: RE | Admit: 2013-03-06 | Discharge: 2013-03-06 | Disposition: A | Discharge status: Home / Self Care | Payer: Medicare Other | Source: Ambulatory Visit | Attending: Nephrology | Admitting: Nephrology

## 2013-03-06 DIAGNOSIS — N184 Chronic kidney disease, stage 4 (severe): Secondary | ICD-10-CM | POA: Diagnosis not present

## 2013-03-06 DIAGNOSIS — D638 Anemia in other chronic diseases classified elsewhere: Secondary | ICD-10-CM | POA: Diagnosis not present

## 2013-03-06 LAB — FERRITIN: Ferritin: 668 ng/mL — ABNORMAL HIGH (ref 22–322)

## 2013-03-06 LAB — IRON AND TIBC
Iron: 57 ug/dL (ref 42–135)
Saturation Ratios: 23 % (ref 20–55)
TIBC: 253 ug/dL (ref 215–435)
UIBC: 196 ug/dL (ref 125–400)

## 2013-03-06 LAB — POCT HEMOGLOBIN-HEMACUE: Hemoglobin: 10.7 g/dL — ABNORMAL LOW (ref 13.0–17.0)

## 2013-03-06 MED ORDER — EPOETIN ALFA 20000 UNIT/ML IJ SOLN
INTRAMUSCULAR | Status: AC
  Administered 2013-03-06: 20000 [IU] via SUBCUTANEOUS
  Filled 2013-03-06: qty 1

## 2013-03-06 MED ORDER — EPOETIN ALFA 20000 UNIT/ML IJ SOLN: 20000.0000 [IU] | INTRAMUSCULAR | Status: DC

## 2013-03-14 DIAGNOSIS — N184 Chronic kidney disease, stage 4 (severe): Secondary | ICD-10-CM | POA: Diagnosis not present

## 2013-03-14 DIAGNOSIS — Z7901 Long term (current) use of anticoagulants: Secondary | ICD-10-CM | POA: Diagnosis not present

## 2013-03-14 DIAGNOSIS — I82409 Acute embolism and thrombosis of unspecified deep veins of unspecified lower extremity: Secondary | ICD-10-CM | POA: Diagnosis not present

## 2013-03-20 ENCOUNTER — Encounter (HOSPITAL_COMMUNITY): Payer: Medicare Other

## 2013-03-20 ENCOUNTER — Encounter: Payer: Self-pay | Admitting: Vascular Surgery

## 2013-03-21 ENCOUNTER — Encounter: Payer: Self-pay | Admitting: Vascular Surgery

## 2013-03-21 ENCOUNTER — Ambulatory Visit (INDEPENDENT_AMBULATORY_CARE_PROVIDER_SITE_OTHER): Payer: Self-pay | Admitting: Vascular Surgery

## 2013-03-21 ENCOUNTER — Ambulatory Visit (HOSPITAL_COMMUNITY)
Admission: RE | Admit: 2013-03-21 | Discharge: 2013-03-21 | Disposition: A | Discharge status: Home / Self Care | Payer: Medicare Other | Source: Ambulatory Visit | Attending: Vascular Surgery | Admitting: Vascular Surgery

## 2013-03-21 VITALS — BP 151/66 | HR 84 | Resp 16 | Ht 75.5 in | Wt 300.0 lb

## 2013-03-21 DIAGNOSIS — Z48812 Encounter for surgical aftercare following surgery on the circulatory system: Secondary | ICD-10-CM | POA: Diagnosis not present

## 2013-03-21 DIAGNOSIS — N184 Chronic kidney disease, stage 4 (severe): Secondary | ICD-10-CM

## 2013-03-21 DIAGNOSIS — N186 End stage renal disease: Secondary | ICD-10-CM | POA: Diagnosis not present

## 2013-03-21 NOTE — Progress Notes (Signed)
Subjective:     Patient ID: [name], male   DOB: [birth year], 62 y.o.   MRN: [id number]  HPI this 62 year old male with stage IV chronic kidney disease but is not yet on hemodialysis returns for initial violation regarding his left radial to cephalic AV fistula which I created in early September of 2014. Patient denies any pain or numbness in the left hand although he has had some mild swelling on the dorsum of the left hand. Fistula has obviously not been utilized.  Review of Systems     Objective:   Physical Exam BP 151/66  Pulse 84  Resp 16  Ht 6' 3.5" (1.918 m)  Wt 300 lb (136.079 kg)  BMI 36.99 kg/m2  General well-developed well-nourished male no apparent stress alert and oriented x3 Left distal forearm incision well healed with excellent pulse and palpable thrill and radial cephalic fistula with good Doppler flow up to the antecubital area. Mild edema left hand on the dorsum.. Left hand well-perfused.  Today I ordered duplex scan of the left radial cephalic fistula. Fistula is widely patent with no areas of stenosis or competing branches in the forearm. The cephalic vein communicates with the basilic vein in the upper arm as the upper arm cephalic vein is poorly developed. There is some irregularity and webbing in the basilic vein above the antecubital area consistent with some previous thrombophlebitis but this is patent.      Assessment:     Chronic renal insufficiency with recently created left radial cephalic AV fistula excellent pulse and thrill with no stenosis or competing branches Communicates with the basilic vein in the upper arm which does have evidence of previous thrombophlebitis    Plan:     Could use of left radial cephalic AV fistula in early December 2014 if necessary Return to see [place] on a when necessary basis

## 2013-03-23 ENCOUNTER — Ambulatory Visit (INDEPENDENT_AMBULATORY_CARE_PROVIDER_SITE_OTHER): Payer: Medicare Other | Admitting: Ophthalmology

## 2013-03-23 ENCOUNTER — Encounter (HOSPITAL_COMMUNITY)
Admission: RE | Admit: 2013-03-23 | Discharge: 2013-03-23 | Disposition: A | Discharge status: Home / Self Care | Payer: Medicare Other | Source: Ambulatory Visit | Attending: Nephrology | Admitting: Nephrology

## 2013-03-23 DIAGNOSIS — H35039 Hypertensive retinopathy, unspecified eye: Secondary | ICD-10-CM

## 2013-03-23 DIAGNOSIS — E1139 Type 2 diabetes mellitus with other diabetic ophthalmic complication: Secondary | ICD-10-CM

## 2013-03-23 DIAGNOSIS — E11359 Type 2 diabetes mellitus with proliferative diabetic retinopathy without macular edema: Secondary | ICD-10-CM | POA: Diagnosis not present

## 2013-03-23 DIAGNOSIS — I10 Essential (primary) hypertension: Secondary | ICD-10-CM | POA: Diagnosis not present

## 2013-03-23 DIAGNOSIS — H26499 Other secondary cataract, unspecified eye: Secondary | ICD-10-CM

## 2013-03-23 DIAGNOSIS — H2510 Age-related nuclear cataract, unspecified eye: Secondary | ICD-10-CM

## 2013-03-23 LAB — POCT HEMOGLOBIN-HEMACUE: Hemoglobin: 9.9 g/dL — ABNORMAL LOW (ref 13.0–17.0)

## 2013-03-23 MED ORDER — EPOETIN ALFA 20000 UNIT/ML IJ SOLN
INTRAMUSCULAR | Status: AC
  Filled 2013-03-23: qty 1

## 2013-03-23 MED ORDER — EPOETIN ALFA 20000 UNIT/ML IJ SOLN
20000.0000 [IU] | INTRAMUSCULAR | Status: DC
  Administered 2013-03-23: 20000 [IU] via SUBCUTANEOUS

## 2013-03-28 DIAGNOSIS — Z7901 Long term (current) use of anticoagulants: Secondary | ICD-10-CM | POA: Diagnosis not present

## 2013-03-28 DIAGNOSIS — I82409 Acute embolism and thrombosis of unspecified deep veins of unspecified lower extremity: Secondary | ICD-10-CM | POA: Diagnosis not present

## 2013-04-06 ENCOUNTER — Encounter (HOSPITAL_COMMUNITY)
Admission: RE | Admit: 2013-04-06 | Discharge: 2013-04-06 | Disposition: A | Discharge status: Home / Self Care | Payer: Medicare Other | Source: Ambulatory Visit | Attending: Nephrology | Admitting: Nephrology

## 2013-04-06 DIAGNOSIS — N184 Chronic kidney disease, stage 4 (severe): Secondary | ICD-10-CM | POA: Diagnosis not present

## 2013-04-06 DIAGNOSIS — D638 Anemia in other chronic diseases classified elsewhere: Secondary | ICD-10-CM | POA: Diagnosis not present

## 2013-04-06 LAB — IRON AND TIBC
Iron: 56 ug/dL (ref 42–135)
Saturation Ratios: 22 % (ref 20–55)
TIBC: 255 ug/dL (ref 215–435)
UIBC: 199 ug/dL (ref 125–400)

## 2013-04-06 LAB — FERRITIN: Ferritin: 413 ng/mL — ABNORMAL HIGH (ref 22–322)

## 2013-04-06 MED ORDER — EPOETIN ALFA 20000 UNIT/ML IJ SOLN
INTRAMUSCULAR | Status: AC
  Filled 2013-04-06: qty 1

## 2013-04-06 MED ORDER — EPOETIN ALFA 20000 UNIT/ML IJ SOLN
20000.0000 [IU] | INTRAMUSCULAR | Status: DC
  Administered 2013-04-06: 20000 [IU] via SUBCUTANEOUS

## 2013-04-07 LAB — POCT HEMOGLOBIN-HEMACUE: Hemoglobin: 10.1 g/dL — ABNORMAL LOW (ref 13.0–17.0)

## 2013-04-12 ENCOUNTER — Other Ambulatory Visit (HOSPITAL_COMMUNITY): Payer: Self-pay

## 2013-04-13 ENCOUNTER — Encounter (HOSPITAL_COMMUNITY): Payer: Medicare Other

## 2013-04-19 ENCOUNTER — Other Ambulatory Visit (HOSPITAL_COMMUNITY): Payer: Self-pay | Admitting: *Deleted

## 2013-04-20 ENCOUNTER — Encounter (HOSPITAL_COMMUNITY): Payer: Medicare Other

## 2013-04-24 ENCOUNTER — Other Ambulatory Visit: Payer: Self-pay | Admitting: *Deleted

## 2013-04-24 ENCOUNTER — Encounter (INDEPENDENT_AMBULATORY_CARE_PROVIDER_SITE_OTHER): Payer: Medicare Other | Admitting: Ophthalmology

## 2013-04-24 ENCOUNTER — Telehealth: Payer: Self-pay | Admitting: Oncology

## 2013-04-24 NOTE — Telephone Encounter (Signed)
lmonvm for pt re appt for 12/2 and mailed shcedule. moved appt from 11/26 to 12/2 due to not able to reach pt

## 2013-04-24 NOTE — Progress Notes (Signed)
Patient calling with c/o pain in  Abdomen and stomach. Requesting pain medication. Per dr Alen Blew, pain is not cancer related and will schedule a visit for patient. pof to schedulers. Patient notified.

## 2013-04-25 ENCOUNTER — Encounter (HOSPITAL_COMMUNITY)
Admission: RE | Admit: 2013-04-25 | Discharge: 2013-04-25 | Disposition: A | Discharge status: Home / Self Care | Payer: Medicare Other | Source: Ambulatory Visit | Attending: Nephrology | Admitting: Nephrology

## 2013-04-25 LAB — RENAL FUNCTION PANEL
Albumin: 3.2 g/dL — ABNORMAL LOW (ref 3.5–5.2)
BUN: 40 mg/dL — ABNORMAL HIGH (ref 6–23)
CO2: 24 mEq/L (ref 19–32)
Calcium: 8.8 mg/dL (ref 8.4–10.5)
Chloride: 98 mEq/L (ref 96–112)
Creatinine, Ser: 3.3 mg/dL — ABNORMAL HIGH (ref 0.50–1.35)
GFR calc Af Amer: 22 mL/min — ABNORMAL LOW (ref >90)
GFR calc non Af Amer: 19 mL/min — ABNORMAL LOW (ref >90)
Glucose, Bld: 279 mg/dL — ABNORMAL HIGH (ref 70–99)
Phosphorus: 3.4 mg/dL (ref 2.3–4.6)
Potassium: 4 mEq/L (ref 3.5–5.1)
Sodium: 135 mEq/L (ref 135–145)

## 2013-04-25 LAB — HEMOGLOBIN AND HEMATOCRIT, BLOOD
HCT: 32.8 % — ABNORMAL LOW (ref 39.0–52.0)
Hemoglobin: 11 g/dL — ABNORMAL LOW (ref 13.0–17.0)

## 2013-04-25 LAB — MAGNESIUM: Magnesium: 2.2 mg/dL (ref 1.5–2.5)

## 2013-04-25 LAB — HEPATITIS B SURFACE ANTIGEN: Hepatitis B Surface Ag: NEGATIVE

## 2013-04-25 MED ORDER — EPOETIN ALFA 20000 UNIT/ML IJ SOLN
20000.0000 [IU] | INTRAMUSCULAR | Status: DC
Start: 2013-04-25 — End: 2013-04-26
  Administered 2013-04-25: 20000 [IU] via SUBCUTANEOUS

## 2013-04-25 MED ORDER — EPOETIN ALFA 20000 UNIT/ML IJ SOLN
INTRAMUSCULAR | Status: AC
  Filled 2013-04-25: qty 1

## 2013-04-26 ENCOUNTER — Ambulatory Visit: Payer: Medicare Other | Admitting: Oncology

## 2013-04-26 LAB — VITAMIN D 25 HYDROXY (VIT D DEFICIENCY, FRACTURES): Vit D, 25-Hydroxy: 13 ng/mL — ABNORMAL LOW (ref 30–89)

## 2013-05-02 ENCOUNTER — Ambulatory Visit: Payer: Medicare Other | Admitting: Oncology

## 2013-05-03 DIAGNOSIS — N184 Chronic kidney disease, stage 4 (severe): Secondary | ICD-10-CM | POA: Diagnosis not present

## 2013-05-03 DIAGNOSIS — Z23 Encounter for immunization: Secondary | ICD-10-CM | POA: Diagnosis not present

## 2013-05-03 DIAGNOSIS — I129 Hypertensive chronic kidney disease with stage 1 through stage 4 chronic kidney disease, or unspecified chronic kidney disease: Secondary | ICD-10-CM | POA: Diagnosis not present

## 2013-05-03 DIAGNOSIS — N2581 Secondary hyperparathyroidism of renal origin: Secondary | ICD-10-CM | POA: Diagnosis not present

## 2013-05-03 DIAGNOSIS — D631 Anemia in chronic kidney disease: Secondary | ICD-10-CM | POA: Diagnosis not present

## 2013-05-04 ENCOUNTER — Encounter (HOSPITAL_COMMUNITY)
Admission: RE | Admit: 2013-05-04 | Discharge: 2013-05-04 | Disposition: A | Discharge status: Home / Self Care | Payer: Medicare Other | Source: Ambulatory Visit | Attending: Nephrology | Admitting: Nephrology

## 2013-05-04 DIAGNOSIS — N184 Chronic kidney disease, stage 4 (severe): Secondary | ICD-10-CM | POA: Diagnosis not present

## 2013-05-04 DIAGNOSIS — D638 Anemia in other chronic diseases classified elsewhere: Secondary | ICD-10-CM | POA: Diagnosis not present

## 2013-05-04 LAB — IRON AND TIBC
Iron: 41 ug/dL — ABNORMAL LOW (ref 42–135)
Saturation Ratios: 15 % — ABNORMAL LOW (ref 20–55)
TIBC: 275 ug/dL (ref 215–435)
UIBC: 234 ug/dL (ref 125–400)

## 2013-05-04 LAB — POCT HEMOGLOBIN-HEMACUE: Hemoglobin: 11.2 g/dL — ABNORMAL LOW (ref 13.0–17.0)

## 2013-05-04 MED ORDER — EPOETIN ALFA 20000 UNIT/ML IJ SOLN: 20000.0000 [IU] | INTRAMUSCULAR | Status: DC

## 2013-05-04 MED ORDER — EPOETIN ALFA 20000 UNIT/ML IJ SOLN
INTRAMUSCULAR | Status: AC
  Administered 2013-05-04: 20000 [IU] via SUBCUTANEOUS
  Filled 2013-05-04: qty 1

## 2013-05-05 LAB — FERRITIN: Ferritin: 389 ng/mL — ABNORMAL HIGH (ref 22–322)

## 2013-05-10 ENCOUNTER — Encounter (INDEPENDENT_AMBULATORY_CARE_PROVIDER_SITE_OTHER): Payer: Medicare Other | Admitting: Ophthalmology

## 2013-05-11 ENCOUNTER — Encounter (HOSPITAL_COMMUNITY)
Admission: RE | Admit: 2013-05-11 | Discharge: 2013-05-11 | Disposition: A | Discharge status: Home / Self Care | Payer: Medicare Other | Source: Ambulatory Visit | Attending: Nephrology | Admitting: Nephrology

## 2013-05-11 LAB — POCT HEMOGLOBIN-HEMACUE: Hemoglobin: 11.3 g/dL — ABNORMAL LOW (ref 13.0–17.0)

## 2013-05-11 MED ORDER — EPOETIN ALFA 20000 UNIT/ML IJ SOLN
INTRAMUSCULAR | Status: AC
  Administered 2013-05-11: 13:00:00
  Filled 2013-05-11: qty 1

## 2013-05-11 MED ORDER — EPOETIN ALFA 20000 UNIT/ML IJ SOLN: 20000.0000 [IU] | INTRAMUSCULAR | Status: DC

## 2013-05-18 ENCOUNTER — Encounter (INDEPENDENT_AMBULATORY_CARE_PROVIDER_SITE_OTHER): Payer: Self-pay | Admitting: Ophthalmology

## 2013-05-18 ENCOUNTER — Encounter (HOSPITAL_COMMUNITY)
Admission: RE | Admit: 2013-05-18 | Discharge: 2013-05-18 | Disposition: A | Discharge status: Home / Self Care | Payer: Medicare Other | Source: Ambulatory Visit | Attending: Nephrology | Admitting: Nephrology

## 2013-05-18 DIAGNOSIS — H2700 Aphakia, unspecified eye: Secondary | ICD-10-CM

## 2013-05-18 LAB — POCT HEMOGLOBIN-HEMACUE: Hemoglobin: 10.3 g/dL — ABNORMAL LOW (ref 13.0–17.0)

## 2013-05-18 MED ORDER — EPOETIN ALFA 20000 UNIT/ML IJ SOLN
INTRAMUSCULAR | Status: AC
  Administered 2013-05-18: 20000 [IU] via SUBCUTANEOUS
  Filled 2013-05-18: qty 1

## 2013-05-18 MED ORDER — EPOETIN ALFA 20000 UNIT/ML IJ SOLN
20000.0000 [IU] | INTRAMUSCULAR | Status: DC
  Administered 2013-05-18: 20000 [IU] via SUBCUTANEOUS

## 2013-05-30 ENCOUNTER — Encounter (HOSPITAL_COMMUNITY)
Admission: RE | Admit: 2013-05-30 | Discharge: 2013-05-30 | Disposition: A | Discharge status: Home / Self Care | Payer: Medicare Other | Source: Ambulatory Visit | Attending: Nephrology | Admitting: Nephrology

## 2013-05-30 LAB — POCT HEMOGLOBIN-HEMACUE: Hemoglobin: 9.7 g/dL — ABNORMAL LOW (ref 13.0–17.0)

## 2013-05-30 MED ORDER — EPOETIN ALFA 20000 UNIT/ML IJ SOLN
INTRAMUSCULAR | Status: AC
  Filled 2013-05-30: qty 1

## 2013-05-30 MED ORDER — EPOETIN ALFA 20000 UNIT/ML IJ SOLN
20000.0000 [IU] | INTRAMUSCULAR | Status: DC
  Administered 2013-05-30: 20000 [IU] via SUBCUTANEOUS

## 2013-06-08 ENCOUNTER — Encounter (HOSPITAL_COMMUNITY)
Admission: RE | Admit: 2013-06-08 | Discharge: 2013-06-08 | Disposition: A | Discharge status: Home / Self Care | Payer: Medicare Other | Source: Ambulatory Visit | Attending: Nephrology | Admitting: Nephrology

## 2013-06-08 DIAGNOSIS — N184 Chronic kidney disease, stage 4 (severe): Secondary | ICD-10-CM | POA: Insufficient documentation

## 2013-06-08 DIAGNOSIS — D638 Anemia in other chronic diseases classified elsewhere: Secondary | ICD-10-CM | POA: Diagnosis not present

## 2013-06-08 LAB — POCT HEMOGLOBIN-HEMACUE: Hemoglobin: 10.5 g/dL — ABNORMAL LOW (ref 13.0–17.0)

## 2013-06-08 LAB — IRON AND TIBC
Iron: 50 ug/dL (ref 42–135)
Saturation Ratios: 18 % — ABNORMAL LOW (ref 20–55)
TIBC: 282 ug/dL (ref 215–435)
UIBC: 232 ug/dL (ref 125–400)

## 2013-06-08 LAB — FERRITIN: Ferritin: 466 ng/mL — ABNORMAL HIGH (ref 22–322)

## 2013-06-08 MED ORDER — EPOETIN ALFA 20000 UNIT/ML IJ SOLN
20000.0000 [IU] | INTRAMUSCULAR | Status: DC
  Administered 2013-06-08: 20000 [IU] via SUBCUTANEOUS

## 2013-06-08 MED ORDER — EPOETIN ALFA 20000 UNIT/ML IJ SOLN
INTRAMUSCULAR | Status: AC
  Filled 2013-06-08: qty 1

## 2013-06-09 ENCOUNTER — Emergency Department (HOSPITAL_COMMUNITY)
Admission: EM | Admit: 2013-06-09 | Discharge: 2013-06-09 | Disposition: A | Discharge status: Home / Self Care | Payer: Medicare Other | Attending: Emergency Medicine | Admitting: Emergency Medicine

## 2013-06-09 ENCOUNTER — Emergency Department (HOSPITAL_COMMUNITY): Payer: Medicare Other

## 2013-06-09 ENCOUNTER — Encounter (HOSPITAL_COMMUNITY): Payer: Self-pay | Admitting: Emergency Medicine

## 2013-06-09 DIAGNOSIS — G4730 Sleep apnea, unspecified: Secondary | ICD-10-CM | POA: Insufficient documentation

## 2013-06-09 DIAGNOSIS — Z992 Dependence on renal dialysis: Secondary | ICD-10-CM | POA: Diagnosis not present

## 2013-06-09 DIAGNOSIS — R0602 Shortness of breath: Secondary | ICD-10-CM

## 2013-06-09 DIAGNOSIS — R059 Cough, unspecified: Secondary | ICD-10-CM | POA: Insufficient documentation

## 2013-06-09 DIAGNOSIS — Z79899 Other long term (current) drug therapy: Secondary | ICD-10-CM | POA: Diagnosis not present

## 2013-06-09 DIAGNOSIS — Z8701 Personal history of pneumonia (recurrent): Secondary | ICD-10-CM | POA: Diagnosis not present

## 2013-06-09 DIAGNOSIS — I120 Hypertensive chronic kidney disease with stage 5 chronic kidney disease or end stage renal disease: Secondary | ICD-10-CM | POA: Insufficient documentation

## 2013-06-09 DIAGNOSIS — E1139 Type 2 diabetes mellitus with other diabetic ophthalmic complication: Secondary | ICD-10-CM | POA: Diagnosis not present

## 2013-06-09 DIAGNOSIS — Z794 Long term (current) use of insulin: Secondary | ICD-10-CM | POA: Insufficient documentation

## 2013-06-09 DIAGNOSIS — Z86718 Personal history of other venous thrombosis and embolism: Secondary | ICD-10-CM | POA: Insufficient documentation

## 2013-06-09 DIAGNOSIS — Z862 Personal history of diseases of the blood and blood-forming organs and certain disorders involving the immune mechanism: Secondary | ICD-10-CM | POA: Insufficient documentation

## 2013-06-09 DIAGNOSIS — Z9119 Patient's noncompliance with other medical treatment and regimen: Secondary | ICD-10-CM | POA: Insufficient documentation

## 2013-06-09 DIAGNOSIS — Z87898 Personal history of other specified conditions: Secondary | ICD-10-CM | POA: Insufficient documentation

## 2013-06-09 DIAGNOSIS — R05 Cough: Secondary | ICD-10-CM | POA: Insufficient documentation

## 2013-06-09 DIAGNOSIS — R609 Edema, unspecified: Secondary | ICD-10-CM | POA: Diagnosis not present

## 2013-06-09 DIAGNOSIS — Z8719 Personal history of other diseases of the digestive system: Secondary | ICD-10-CM | POA: Diagnosis not present

## 2013-06-09 DIAGNOSIS — Z95818 Presence of other cardiac implants and grafts: Secondary | ICD-10-CM | POA: Insufficient documentation

## 2013-06-09 DIAGNOSIS — N186 End stage renal disease: Secondary | ICD-10-CM | POA: Diagnosis not present

## 2013-06-09 DIAGNOSIS — Z91199 Patient's noncompliance with other medical treatment and regimen due to unspecified reason: Secondary | ICD-10-CM | POA: Insufficient documentation

## 2013-06-09 DIAGNOSIS — Z7901 Long term (current) use of anticoagulants: Secondary | ICD-10-CM | POA: Diagnosis not present

## 2013-06-09 DIAGNOSIS — R0609 Other forms of dyspnea: Secondary | ICD-10-CM | POA: Diagnosis not present

## 2013-06-09 DIAGNOSIS — E11319 Type 2 diabetes mellitus with unspecified diabetic retinopathy without macular edema: Secondary | ICD-10-CM | POA: Insufficient documentation

## 2013-06-09 DIAGNOSIS — M47817 Spondylosis without myelopathy or radiculopathy, lumbosacral region: Secondary | ICD-10-CM | POA: Insufficient documentation

## 2013-06-09 LAB — CBC
HCT: 32.2 % — ABNORMAL LOW (ref 39.0–52.0)
Hemoglobin: 10.7 g/dL — ABNORMAL LOW (ref 13.0–17.0)
MCH: 26.6 pg (ref 26.0–34.0)
MCHC: 33.2 g/dL (ref 30.0–36.0)
MCV: 80.1 fL (ref 78.0–100.0)
Platelets: 296 10*3/uL (ref 150–400)
RBC: 4.02 MIL/uL — ABNORMAL LOW (ref 4.22–5.81)
RDW: 15.9 % — ABNORMAL HIGH (ref 11.5–15.5)
WBC: 6 10*3/uL (ref 4.0–10.5)

## 2013-06-09 LAB — BASIC METABOLIC PANEL
BUN: 46 mg/dL — ABNORMAL HIGH (ref 6–23)
CO2: 25 mEq/L (ref 19–32)
Calcium: 9.3 mg/dL (ref 8.4–10.5)
Chloride: 100 mEq/L (ref 96–112)
Creatinine, Ser: 3.27 mg/dL — ABNORMAL HIGH (ref 0.50–1.35)
GFR calc Af Amer: 22 mL/min — ABNORMAL LOW (ref >90)
GFR calc non Af Amer: 19 mL/min — ABNORMAL LOW (ref >90)
Glucose, Bld: 126 mg/dL — ABNORMAL HIGH (ref 70–99)
Potassium: 4 mEq/L (ref 3.7–5.3)
Sodium: 140 mEq/L (ref 137–147)

## 2013-06-09 LAB — POCT I-STAT TROPONIN I: Troponin i, poc: 0.03 ng/mL (ref 0.00–0.08)

## 2013-06-09 LAB — PRO B NATRIURETIC PEPTIDE: Pro B Natriuretic peptide (BNP): 7979 pg/mL — ABNORMAL HIGH (ref 0–125)

## 2013-06-09 NOTE — ED Notes (Signed)
Spoke with lab about add on

## 2013-06-09 NOTE — ED Provider Notes (Signed)
CSN: [id number]     Arrival date & time 06/09/13  1655 History   First MD Initiated Contact with Patient 06/09/13 1735     Chief Complaint  Patient presents with  . Shortness of Breath   (Consider location/radiation/quality/duration/timing/severity/associated sxs/prior Treatment) Patient is a 63 y.o. male presenting with shortness of breath.  Shortness of Breath Severity:  Moderate Onset quality:  Gradual Duration:  2 days Timing:  Constant Progression:  Worsening Chronicity:  New Context: activity   Context comment:  Laying flat Relieved by:  Rest Worsened by:  Activity Associated symptoms: cough   Associated symptoms: no abdominal pain, no chest pain, no fever and no vomiting     Past Medical History  Diagnosis Date  . Cancer   . DVT (deep venous thrombosis)     Right leg  . Diabetic retinopathy   . Hypertension   . Hyperlipidemia   . Non Hodgkin's lymphoma     Tx 2009  . History of cardiac catheterization     pt denies this on 03/15/2012  . Nodular lymphoma of intra-abdominal lymph nodes   . Shortness of breath   . Sleep apnea     "suppose to have a sleep studfy, but they never told me when.  . Peripheral vascular disease   . Blood transfusion   . 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  Marland Kitchen Headache(784.0)   . Noncompliance 03/16/2012  . CKD (chronic kidney disease) stage 4, GFR 15-29 ml/min 03/18/2012  . NSVT (nonsustained ventricular tachycardia) 03/18/2012  . Family history of anesthesia complication     " my son wakes up slowly"  . Pneumonia     hosp.- 2013  . Anemia   . GERD (gastroesophageal reflux disease)     uses alka seltzere on occas.   . Arthritis     HNP- lumbar  . Diabetes mellitus     insulin dependent   Past Surgical History  Procedure Laterality Date  . Porta catheter    . Porta catheter insertion      later removed  . Porta catheter removed    . Coloscopy    . Pars plana vitrectomy  08/27/2011    Procedure: PARS PLANA  VITRECTOMY WITH 25 GAUGE;  Surgeon: Hayden Pedro, MD;  Location: [facility];  Service: Ophthalmology;  Laterality: Left;  Repair of complex traction retinal detachment left eye  . Cardiac catheterization      pt denies this on 03/15/2012  . Eye surgery    . Viterectomy  05/10/2012    od  . Pars plana vitrectomy  05/10/2012    Procedure: PARS PLANA VITRECTOMY WITH 25 GAUGE;  Surgeon: Hayden Pedro, MD;  Location: [facility];  Service: Ophthalmology;  Laterality: Right;  Repair Complex Traction Retinal Detachment  . Membrane peel  05/10/2012    Procedure: MEMBRANE PEEL;  Surgeon: Hayden Pedro, MD;  Location: [facility];  Service: Ophthalmology;  Laterality: Right;  . Photocoagulation with laser  05/10/2012    Procedure: PHOTOCOAGULATION WITH LASER;  Surgeon: Hayden Pedro, MD;  Location: [facility];  Service: Ophthalmology;  Laterality: Right;  . Gas insertion  05/10/2012    Procedure: INSERTION OF GAS;  Surgeon: Hayden Pedro, MD;  Location: [facility];  Service: Ophthalmology;  Laterality: Right;  . Ivc filter  09/2012    due to preparation for surgery  . Av fistula placement Left 02/03/2013    Procedure: ARTERIOVENOUS (AV) FISTULA CREATION- LEFT RADIAL CEPHALIC;  ULTRASOUND GUIDED;  Surgeon: Mal Misty, MD;  [facility];  Service: Vascular;  Laterality: Left;   Family History  Problem Relation Age of Onset  . Anesthesia problems Son   . Hypertension Son   . Diabetes Father   . Hypertension Father   . Other Father     amputation   History  Substance Use Topics  . Smoking status: Never Smoker   . Smokeless tobacco: Never Used  . Alcohol Use: No    Review of Systems  Constitutional: Negative for fever.  HENT: Negative for congestion.   Respiratory: Positive for cough and shortness of breath.   Cardiovascular: Negative for chest pain.  Gastrointestinal: Negative for nausea, vomiting, abdominal pain and diarrhea.  All other systems reviewed and are negative.    Allergies  Review of  patient's allergies indicates no known allergies.  Home Medications   Current Outpatient Rx  Name  Route  Sig  Dispense  Refill  . amLODipine (NORVASC) 10 MG tablet   Oral   Take 1 tablet (10 mg total) by mouth at bedtime.   30 tablet   6   . calcitRIOL (ROCALTROL) 0.25 MCG capsule   Oral   Take 0.25 mcg by mouth daily.           . carvedilol (COREG) 25 MG tablet   Oral   Take 25 mg by mouth 2 (two) times daily with a meal.         . furosemide (LASIX) 80 MG tablet   Oral   Take 80 mg by mouth 2 (two) times daily.         . insulin glargine (LANTUS) 100 UNIT/ML injection   Subcutaneous   Inject 60 Units into the skin at bedtime.         . insulin lispro (HUMALOG) 100 UNIT/ML injection   Subcutaneous   Inject 10 Units into the skin 2 (two) times daily.         Marland Kitchen KLOR-CON M20 20 MEQ tablet   Oral   Take 20 mEq by mouth Daily.          Marland Kitchen oxyCODONE-acetaminophen (PERCOCET/ROXICET) 5-325 MG per tablet   Oral   Take 1 tablet by mouth every 6 (six) hours as needed for pain.   30 tablet   0   . polysaccharide iron (NIFEREX) 150 MG CAPS capsule   Oral   Take 1 capsule (150 mg total) by mouth daily.   30 each   2   . traMADol (ULTRAM) 50 MG tablet   Oral   Take 50 mg by mouth every 6 (six) hours as needed for pain.         Marland Kitchen warfarin (COUMADIN) 5 MG tablet   Oral   Take 5-10 mg by mouth daily. Take 10mg  daily except 5mg  on Mondays and Fridays          BP 166/79  Pulse 86  Temp(Src) 97.7 F (36.5 C) (Oral)  Resp 22  SpO2 98% Physical Exam  Nursing note and vitals reviewed. Constitutional: He is oriented to person, place, and time. He appears well-developed and well-nourished. No distress.  HENT:  Head: Normocephalic and atraumatic.  Mouth/Throat: Oropharynx is clear and moist.  Eyes: Conjunctivae are normal. Pupils are equal, round, and reactive to light. No scleral icterus.  Neck: Neck supple.  Cardiovascular: Normal rate, regular rhythm,  normal heart sounds and intact distal pulses.   No murmur heard. Pulmonary/Chest: Effort normal. No stridor. Not  tachypneic. No respiratory distress. He has decreased breath sounds (bilateral bases). He has no wheezes. He has no rales.  Abdominal: Soft. He exhibits no distension. There is no tenderness.  Musculoskeletal: Normal range of motion. He exhibits edema.  Neurological: He is alert and oriented to person, place, and time.  Skin: Skin is warm and dry. No rash noted.  Psychiatric: He has a normal mood and affect. His behavior is normal.    ED Course  Procedures (including critical care time) Labs Review Labs Reviewed  CBC - Abnormal; Notable for the following:    RBC 4.02 (*)    Hemoglobin 10.7 (*)    HCT 32.2 (*)    RDW 15.9 (*)    All other components within normal limits  BASIC METABOLIC PANEL - Abnormal; Notable for the following:    Glucose, Bld 126 (*)    BUN 46 (*)    Creatinine, Ser 3.27 (*)    GFR calc non Af Amer 19 (*)    GFR calc Af Amer 22 (*)    All other components within normal limits  PRO B NATRIURETIC PEPTIDE - Abnormal; Notable for the following:    Pro B Natriuretic peptide (BNP) 7979.0 (*)    All other components within normal limits  POCT I-STAT TROPONIN I   Imaging Review Dg Chest 2 View  06/09/2013   CLINICAL DATA:  Chest pain difficulty breathing  EXAM: CHEST  2 VIEW  COMPARISON:  12/28/2012  FINDINGS: Cardiac shadow is stable. The lungs are well aerated bilaterally. No focal confluent infiltrate is seen. No bony abnormality is noted.  IMPRESSION: No active cardiopulmonary disease.   Electronically Signed   By: Inez Catalina [name]   On: 06/09/2013 18:49  All radiology studies independently viewed by me.     EKG Interpretation    Date/Time:  Friday June 09 2013 17:17:00 EST Ventricular Rate:  84 PR Interval:  172 QRS Duration: 154 QT Interval:  456 QTC Calculation: 538 R Axis:   -65 Text Interpretation:  Normal sinus rhythm Right bundle  branch block Left anterior fascicular block  Bifascicular block  Left ventricular hypertrophy with QRS widening Abnormal ECG LAFB new since prior.   Confirmed by [facility]  MD, TREY ([id number]) on 06/09/2013 6:42:21 PM            MDM   1. Shortness of breath    63 yo male with hx of chronic renal insufficiency, being set up for dialysis, presenting with worsening shortness of breath over past 2 days.  Not distressed, but decreased lung sounds in bases, peripheral edema.  Of note, he denies any recent chest pain.  Plan labs and CXR.  Ambulatory pulse ox.    Labs at baseline.  Able to ambulate without increased WOB or hypoxia.  SOB most likely related to chronic kidney disease.  He appears stable for continued outpatient management.    Houston Siren, MD 06/10/13 Shelah Lewandowsky

## 2013-06-09 NOTE — ED Notes (Signed)
Ambulated 150 ft in hallway. Tolerated well. O2 range from 98% high to 94% for the low. HR = 90 bpm average.

## 2013-06-09 NOTE — ED Notes (Signed)
Patient transported to X-ray 

## 2013-06-09 NOTE — ED Notes (Addendum)
Presents with with SOB, difficulty taking a deep breath, denies pain. SOB worse with lying flat, denies nausea, denies increased SOB with exertion. Bilateral breath sounds diminished, pedal edema noted.  Speaking in full sentences. SAts 99% RA

## 2013-06-09 NOTE — Discharge Instructions (Signed)

## 2013-06-14 ENCOUNTER — Other Ambulatory Visit (HOSPITAL_COMMUNITY): Payer: Self-pay | Admitting: *Deleted

## 2013-06-15 ENCOUNTER — Encounter (HOSPITAL_COMMUNITY)
Admission: RE | Admit: 2013-06-15 | Discharge: 2013-06-15 | Disposition: A | Discharge status: Home / Self Care | Payer: Medicare Other | Source: Ambulatory Visit | Attending: Nephrology | Admitting: Nephrology

## 2013-06-15 DIAGNOSIS — N184 Chronic kidney disease, stage 4 (severe): Secondary | ICD-10-CM | POA: Insufficient documentation

## 2013-06-15 DIAGNOSIS — I129 Hypertensive chronic kidney disease with stage 1 through stage 4 chronic kidney disease, or unspecified chronic kidney disease: Secondary | ICD-10-CM | POA: Diagnosis not present

## 2013-06-15 DIAGNOSIS — D631 Anemia in chronic kidney disease: Secondary | ICD-10-CM | POA: Insufficient documentation

## 2013-06-15 DIAGNOSIS — Z7901 Long term (current) use of anticoagulants: Secondary | ICD-10-CM | POA: Diagnosis not present

## 2013-06-15 DIAGNOSIS — N039 Chronic nephritic syndrome with unspecified morphologic changes: Principal | ICD-10-CM

## 2013-06-15 DIAGNOSIS — I82409 Acute embolism and thrombosis of unspecified deep veins of unspecified lower extremity: Secondary | ICD-10-CM | POA: Diagnosis not present

## 2013-06-15 LAB — RENAL FUNCTION PANEL
Albumin: 3.3 g/dL — ABNORMAL LOW (ref 3.5–5.2)
BUN: 58 mg/dL — ABNORMAL HIGH (ref 6–23)
CO2: 24 mEq/L (ref 19–32)
Calcium: 8.9 mg/dL (ref 8.4–10.5)
Chloride: 102 mEq/L (ref 96–112)
Creatinine, Ser: 4.03 mg/dL — ABNORMAL HIGH (ref 0.50–1.35)
GFR calc Af Amer: 17 mL/min — ABNORMAL LOW (ref >90)
GFR calc non Af Amer: 15 mL/min — ABNORMAL LOW (ref >90)
Glucose, Bld: 122 mg/dL — ABNORMAL HIGH (ref 70–99)
Phosphorus: 4.1 mg/dL (ref 2.3–4.6)
Potassium: 4.1 mEq/L (ref 3.7–5.3)
Sodium: 142 mEq/L (ref 137–147)

## 2013-06-15 LAB — POCT HEMOGLOBIN-HEMACUE: Hemoglobin: 12 g/dL — ABNORMAL LOW (ref 13.0–17.0)

## 2013-06-15 LAB — MAGNESIUM: Magnesium: 2.3 mg/dL (ref 1.5–2.5)

## 2013-06-15 MED ORDER — EPOETIN ALFA 20000 UNIT/ML IJ SOLN: 20000.0000 [IU] | INTRAMUSCULAR | Status: DC

## 2013-06-21 ENCOUNTER — Other Ambulatory Visit (HOSPITAL_COMMUNITY): Payer: Self-pay | Admitting: *Deleted

## 2013-06-22 ENCOUNTER — Ambulatory Visit (HOSPITAL_COMMUNITY)
Admission: RE | Admit: 2013-06-22 | Discharge: 2013-06-22 | Disposition: A | Discharge status: Home / Self Care | Payer: Medicare Other | Source: Ambulatory Visit | Attending: Nephrology | Admitting: Nephrology

## 2013-06-22 DIAGNOSIS — I129 Hypertensive chronic kidney disease with stage 1 through stage 4 chronic kidney disease, or unspecified chronic kidney disease: Secondary | ICD-10-CM | POA: Insufficient documentation

## 2013-06-22 DIAGNOSIS — N184 Chronic kidney disease, stage 4 (severe): Secondary | ICD-10-CM | POA: Diagnosis not present

## 2013-06-22 MED ORDER — SODIUM CHLORIDE 0.9 % IV SOLN
1020.0000 mg | Freq: Once | INTRAVENOUS | Status: AC
  Administered 2013-06-22: 1020 mg via INTRAVENOUS
  Filled 2013-06-22: qty 34

## 2013-06-22 MED ORDER — SODIUM CHLORIDE 0.9 % IV SOLN: Freq: Once | INTRAVENOUS | Status: DC

## 2013-06-26 DIAGNOSIS — N2581 Secondary hyperparathyroidism of renal origin: Secondary | ICD-10-CM | POA: Diagnosis not present

## 2013-06-26 DIAGNOSIS — E1129 Type 2 diabetes mellitus with other diabetic kidney complication: Secondary | ICD-10-CM | POA: Diagnosis not present

## 2013-06-29 ENCOUNTER — Encounter (HOSPITAL_COMMUNITY)
Admission: RE | Admit: 2013-06-29 | Discharge: 2013-06-29 | Disposition: A | Discharge status: Home / Self Care | Payer: Medicare Other | Source: Ambulatory Visit | Attending: Nephrology | Admitting: Nephrology

## 2013-06-29 LAB — POCT HEMOGLOBIN-HEMACUE: Hemoglobin: 9.5 g/dL — ABNORMAL LOW (ref 13.0–17.0)

## 2013-06-29 MED ORDER — EPOETIN ALFA 20000 UNIT/ML IJ SOLN
INTRAMUSCULAR | Status: AC
  Administered 2013-06-29: 20000 [IU] via SUBCUTANEOUS
  Filled 2013-06-29: qty 1

## 2013-06-29 MED ORDER — EPOETIN ALFA 20000 UNIT/ML IJ SOLN: 20000.0000 [IU] | INTRAMUSCULAR | Status: DC

## 2013-07-04 DIAGNOSIS — N2581 Secondary hyperparathyroidism of renal origin: Secondary | ICD-10-CM | POA: Diagnosis not present

## 2013-07-04 DIAGNOSIS — D631 Anemia in chronic kidney disease: Secondary | ICD-10-CM | POA: Diagnosis not present

## 2013-07-04 DIAGNOSIS — I129 Hypertensive chronic kidney disease with stage 1 through stage 4 chronic kidney disease, or unspecified chronic kidney disease: Secondary | ICD-10-CM | POA: Diagnosis not present

## 2013-07-04 DIAGNOSIS — N039 Chronic nephritic syndrome with unspecified morphologic changes: Secondary | ICD-10-CM | POA: Diagnosis not present

## 2013-07-04 DIAGNOSIS — N184 Chronic kidney disease, stage 4 (severe): Secondary | ICD-10-CM | POA: Diagnosis not present

## 2013-07-06 ENCOUNTER — Encounter (HOSPITAL_COMMUNITY)
Admission: RE | Admit: 2013-07-06 | Discharge: 2013-07-06 | Disposition: A | Discharge status: Home / Self Care | Payer: Medicare Other | Source: Ambulatory Visit | Attending: Nephrology | Admitting: Nephrology

## 2013-07-06 DIAGNOSIS — I82409 Acute embolism and thrombosis of unspecified deep veins of unspecified lower extremity: Secondary | ICD-10-CM | POA: Diagnosis not present

## 2013-07-06 DIAGNOSIS — D638 Anemia in other chronic diseases classified elsewhere: Secondary | ICD-10-CM | POA: Insufficient documentation

## 2013-07-06 DIAGNOSIS — N184 Chronic kidney disease, stage 4 (severe): Secondary | ICD-10-CM | POA: Diagnosis not present

## 2013-07-06 DIAGNOSIS — Z7901 Long term (current) use of anticoagulants: Secondary | ICD-10-CM | POA: Diagnosis not present

## 2013-07-06 LAB — IRON AND TIBC
Iron: 45 ug/dL (ref 42–135)
Saturation Ratios: 17 % — ABNORMAL LOW (ref 20–55)
TIBC: 264 ug/dL (ref 215–435)
UIBC: 219 ug/dL (ref 125–400)

## 2013-07-06 LAB — FERRITIN: Ferritin: 481 ng/mL — ABNORMAL HIGH (ref 22–322)

## 2013-07-06 LAB — POCT HEMOGLOBIN-HEMACUE: Hemoglobin: 10.3 g/dL — ABNORMAL LOW (ref 13.0–17.0)

## 2013-07-06 MED ORDER — EPOETIN ALFA 20000 UNIT/ML IJ SOLN
20000.0000 [IU] | INTRAMUSCULAR | Status: DC
  Administered 2013-07-06: 20000 [IU] via SUBCUTANEOUS

## 2013-07-06 MED ORDER — EPOETIN ALFA 20000 UNIT/ML IJ SOLN
INTRAMUSCULAR | Status: AC
  Filled 2013-07-06: qty 1

## 2013-07-07 ENCOUNTER — Encounter (HOSPITAL_COMMUNITY): Payer: Medicare Other

## 2013-07-11 ENCOUNTER — Other Ambulatory Visit (HOSPITAL_COMMUNITY): Payer: Self-pay | Admitting: *Deleted

## 2013-07-13 ENCOUNTER — Encounter (HOSPITAL_COMMUNITY)
Admission: RE | Admit: 2013-07-13 | Discharge: 2013-07-13 | Disposition: A | Discharge status: Home / Self Care | Payer: Medicare Other | Source: Ambulatory Visit | Attending: Nephrology | Admitting: Nephrology

## 2013-07-13 LAB — POCT HEMOGLOBIN-HEMACUE: Hemoglobin: 12.1 g/dL — ABNORMAL LOW (ref 13.0–17.0)

## 2013-07-13 MED ORDER — EPOETIN ALFA 20000 UNIT/ML IJ SOLN: 20000.0000 [IU] | INTRAMUSCULAR | Status: DC

## 2013-07-13 MED ORDER — SODIUM CHLORIDE 0.9 % IV SOLN
1020.0000 mg | Freq: Once | INTRAVENOUS | Status: AC
  Administered 2013-07-13: 1020 mg via INTRAVENOUS
  Filled 2013-07-13: qty 34

## 2013-07-13 MED ORDER — SODIUM CHLORIDE 0.9 % IV SOLN: INTRAVENOUS | Status: DC

## 2013-07-19 ENCOUNTER — Other Ambulatory Visit (HOSPITAL_COMMUNITY): Payer: Self-pay | Admitting: *Deleted

## 2013-07-20 ENCOUNTER — Encounter (HOSPITAL_COMMUNITY)
Admission: RE | Admit: 2013-07-20 | Discharge: 2013-07-20 | Disposition: A | Discharge status: Home / Self Care | Payer: Medicare Other | Source: Ambulatory Visit | Attending: Nephrology | Admitting: Nephrology

## 2013-07-20 MED ORDER — SODIUM CHLORIDE 0.9 % IV SOLN
125.0000 mg | INTRAVENOUS | Status: DC
  Administered 2013-07-20: 125 mg via INTRAVENOUS
  Filled 2013-07-20: qty 10

## 2013-07-21 LAB — POCT HEMOGLOBIN-HEMACUE: Hemoglobin: 13 g/dL (ref 13.0–17.0)

## 2013-07-24 ENCOUNTER — Other Ambulatory Visit: Payer: Self-pay | Admitting: Cardiovascular Disease

## 2013-07-27 ENCOUNTER — Encounter (HOSPITAL_COMMUNITY): Payer: Medicare Other

## 2013-08-03 ENCOUNTER — Other Ambulatory Visit (HOSPITAL_COMMUNITY): Payer: Self-pay

## 2013-08-03 ENCOUNTER — Encounter (HOSPITAL_COMMUNITY): Payer: Medicare Other

## 2013-08-04 ENCOUNTER — Encounter (HOSPITAL_COMMUNITY): Payer: Self-pay | Admitting: Emergency Medicine

## 2013-08-04 ENCOUNTER — Inpatient Hospital Stay (HOSPITAL_COMMUNITY): Admission: RE | Admit: 2013-08-04 | Payer: Medicare Other | Source: Ambulatory Visit

## 2013-08-04 ENCOUNTER — Inpatient Hospital Stay (HOSPITAL_COMMUNITY)
Admission: EM | Admit: 2013-08-04 | Discharge: 2013-08-08 | DRG: 682 | Disposition: A | Discharge status: Skilled Nursing Facility | Payer: Medicare Other | Attending: Internal Medicine | Admitting: Internal Medicine

## 2013-08-04 ENCOUNTER — Emergency Department (HOSPITAL_COMMUNITY): Payer: Medicare Other

## 2013-08-04 DIAGNOSIS — E876 Hypokalemia: Secondary | ICD-10-CM | POA: Diagnosis present

## 2013-08-04 DIAGNOSIS — R42 Dizziness and giddiness: Secondary | ICD-10-CM | POA: Diagnosis not present

## 2013-08-04 DIAGNOSIS — Z833 Family history of diabetes mellitus: Secondary | ICD-10-CM

## 2013-08-04 DIAGNOSIS — I739 Peripheral vascular disease, unspecified: Secondary | ICD-10-CM | POA: Diagnosis present

## 2013-08-04 DIAGNOSIS — E11319 Type 2 diabetes mellitus with unspecified diabetic retinopathy without macular edema: Secondary | ICD-10-CM | POA: Diagnosis not present

## 2013-08-04 DIAGNOSIS — E1142 Type 2 diabetes mellitus with diabetic polyneuropathy: Secondary | ICD-10-CM | POA: Diagnosis present

## 2013-08-04 DIAGNOSIS — Z794 Long term (current) use of insulin: Secondary | ICD-10-CM

## 2013-08-04 DIAGNOSIS — N2581 Secondary hyperparathyroidism of renal origin: Secondary | ICD-10-CM | POA: Diagnosis present

## 2013-08-04 DIAGNOSIS — D649 Anemia, unspecified: Secondary | ICD-10-CM | POA: Diagnosis present

## 2013-08-04 DIAGNOSIS — R739 Hyperglycemia, unspecified: Secondary | ICD-10-CM | POA: Diagnosis present

## 2013-08-04 DIAGNOSIS — G4730 Sleep apnea, unspecified: Secondary | ICD-10-CM | POA: Diagnosis present

## 2013-08-04 DIAGNOSIS — R1013 Epigastric pain: Secondary | ICD-10-CM | POA: Diagnosis not present

## 2013-08-04 DIAGNOSIS — M6281 Muscle weakness (generalized): Secondary | ICD-10-CM | POA: Diagnosis not present

## 2013-08-04 DIAGNOSIS — E1165 Type 2 diabetes mellitus with hyperglycemia: Secondary | ICD-10-CM | POA: Diagnosis present

## 2013-08-04 DIAGNOSIS — N186 End stage renal disease: Secondary | ICD-10-CM | POA: Diagnosis present

## 2013-08-04 DIAGNOSIS — R109 Unspecified abdominal pain: Secondary | ICD-10-CM | POA: Diagnosis not present

## 2013-08-04 DIAGNOSIS — N058 Unspecified nephritic syndrome with other morphologic changes: Secondary | ICD-10-CM | POA: Diagnosis present

## 2013-08-04 DIAGNOSIS — E878 Other disorders of electrolyte and fluid balance, not elsewhere classified: Secondary | ICD-10-CM

## 2013-08-04 DIAGNOSIS — I82409 Acute embolism and thrombosis of unspecified deep veins of unspecified lower extremity: Secondary | ICD-10-CM | POA: Diagnosis not present

## 2013-08-04 DIAGNOSIS — I120 Hypertensive chronic kidney disease with stage 5 chronic kidney disease or end stage renal disease: Secondary | ICD-10-CM | POA: Diagnosis present

## 2013-08-04 DIAGNOSIS — Z8249 Family history of ischemic heart disease and other diseases of the circulatory system: Secondary | ICD-10-CM | POA: Diagnosis not present

## 2013-08-04 DIAGNOSIS — E1149 Type 2 diabetes mellitus with other diabetic neurological complication: Secondary | ICD-10-CM | POA: Diagnosis present

## 2013-08-04 DIAGNOSIS — E119 Type 2 diabetes mellitus without complications: Secondary | ICD-10-CM | POA: Diagnosis not present

## 2013-08-04 DIAGNOSIS — E1139 Type 2 diabetes mellitus with other diabetic ophthalmic complication: Secondary | ICD-10-CM | POA: Diagnosis present

## 2013-08-04 DIAGNOSIS — Z86718 Personal history of other venous thrombosis and embolism: Secondary | ICD-10-CM

## 2013-08-04 DIAGNOSIS — C8589 Other specified types of non-Hodgkin lymphoma, extranodal and solid organ sites: Secondary | ICD-10-CM | POA: Diagnosis present

## 2013-08-04 DIAGNOSIS — Z992 Dependence on renal dialysis: Secondary | ICD-10-CM | POA: Diagnosis present

## 2013-08-04 DIAGNOSIS — G8929 Other chronic pain: Secondary | ICD-10-CM | POA: Diagnosis present

## 2013-08-04 DIAGNOSIS — E11359 Type 2 diabetes mellitus with proliferative diabetic retinopathy without macular edema: Secondary | ICD-10-CM | POA: Diagnosis present

## 2013-08-04 DIAGNOSIS — I10 Essential (primary) hypertension: Secondary | ICD-10-CM | POA: Diagnosis present

## 2013-08-04 DIAGNOSIS — C8590 Non-Hodgkin lymphoma, unspecified, unspecified site: Secondary | ICD-10-CM | POA: Diagnosis present

## 2013-08-04 DIAGNOSIS — E785 Hyperlipidemia, unspecified: Secondary | ICD-10-CM | POA: Diagnosis present

## 2013-08-04 DIAGNOSIS — R7309 Other abnormal glucose: Secondary | ICD-10-CM | POA: Diagnosis not present

## 2013-08-04 DIAGNOSIS — Z7901 Long term (current) use of anticoagulants: Secondary | ICD-10-CM

## 2013-08-04 DIAGNOSIS — E1129 Type 2 diabetes mellitus with other diabetic kidney complication: Secondary | ICD-10-CM | POA: Diagnosis present

## 2013-08-04 DIAGNOSIS — E113599 Type 2 diabetes mellitus with proliferative diabetic retinopathy without macular edema, unspecified eye: Secondary | ICD-10-CM | POA: Diagnosis present

## 2013-08-04 DIAGNOSIS — N19 Unspecified kidney failure: Secondary | ICD-10-CM | POA: Diagnosis not present

## 2013-08-04 DIAGNOSIS — E1121 Type 2 diabetes mellitus with diabetic nephropathy: Secondary | ICD-10-CM | POA: Diagnosis present

## 2013-08-04 HISTORY — DX: Type 2 diabetes mellitus without complications: E11.9

## 2013-08-04 HISTORY — DX: Dependence on renal dialysis: Z99.2

## 2013-08-04 HISTORY — DX: Long term (current) use of insulin: Z79.4

## 2013-08-04 HISTORY — DX: End stage renal disease: N18.6

## 2013-08-04 HISTORY — DX: Reserved for inherently not codable concepts without codable children: IMO0001

## 2013-08-04 LAB — CBC WITH DIFFERENTIAL/PLATELET
Basophils Absolute: 0 10*3/uL (ref 0.0–0.1)
Basophils Relative: 0 % (ref 0–1)
Eosinophils Absolute: 0 10*3/uL (ref 0.0–0.7)
Eosinophils Relative: 1 % (ref 0–5)
HCT: 35.3 % — ABNORMAL LOW (ref 39.0–52.0)
Hemoglobin: 12.6 g/dL — ABNORMAL LOW (ref 13.0–17.0)
Lymphocytes Relative: 20 % (ref 12–46)
Lymphs Abs: 1.1 10*3/uL (ref 0.7–4.0)
MCH: 27.2 pg (ref 26.0–34.0)
MCHC: 35.7 g/dL (ref 30.0–36.0)
MCV: 76.2 fL — ABNORMAL LOW (ref 78.0–100.0)
Monocytes Absolute: 0.3 10*3/uL (ref 0.1–1.0)
Monocytes Relative: 5 % (ref 3–12)
Neutro Abs: 4 10*3/uL (ref 1.7–7.7)
Neutrophils Relative %: 74 % (ref 43–77)
Platelets: 259 10*3/uL (ref 150–400)
RBC: 4.63 MIL/uL (ref 4.22–5.81)
RDW: 14.1 % (ref 11.5–15.5)
WBC: 5.5 10*3/uL (ref 4.0–10.5)

## 2013-08-04 LAB — COMPREHENSIVE METABOLIC PANEL
ALT: 16 U/L (ref 0–53)
AST: 18 U/L (ref 0–37)
Albumin: 3.8 g/dL (ref 3.5–5.2)
Alkaline Phosphatase: 105 U/L (ref 39–117)
BUN: 132 mg/dL — ABNORMAL HIGH (ref 6–23)
CO2: 31 mEq/L (ref 19–32)
Calcium: 9.5 mg/dL (ref 8.4–10.5)
Chloride: 74 mEq/L — ABNORMAL LOW (ref 96–112)
Creatinine, Ser: 8.63 mg/dL — ABNORMAL HIGH (ref 0.50–1.35)
GFR calc Af Amer: 7 mL/min — ABNORMAL LOW (ref >90)
GFR calc non Af Amer: 6 mL/min — ABNORMAL LOW (ref >90)
Glucose, Bld: 618 mg/dL (ref 70–99)
Potassium: 3.5 mEq/L — ABNORMAL LOW (ref 3.7–5.3)
Sodium: 128 mEq/L — ABNORMAL LOW (ref 137–147)
Total Bilirubin: 0.3 mg/dL (ref 0.3–1.2)
Total Protein: 8.7 g/dL — ABNORMAL HIGH (ref 6.0–8.3)

## 2013-08-04 LAB — URINALYSIS, ROUTINE W REFLEX MICROSCOPIC
Bilirubin Urine: NEGATIVE
Glucose, UA: 500 mg/dL — AB
Ketones, ur: NEGATIVE mg/dL
Leukocytes, UA: NEGATIVE
Nitrite: NEGATIVE
Protein, ur: 100 mg/dL — AB
Specific Gravity, Urine: 1.013 (ref 1.005–1.030)
Urobilinogen, UA: 0.2 mg/dL (ref 0.0–1.0)
pH: 7.5 (ref 5.0–8.0)

## 2013-08-04 LAB — URINE MICROSCOPIC-ADD ON

## 2013-08-04 LAB — I-STAT ARTERIAL BLOOD GAS, ED
Acid-Base Excess: 11 mmol/L — ABNORMAL HIGH (ref 0.0–2.0)
Bicarbonate: 35.8 mEq/L — ABNORMAL HIGH (ref 20.0–24.0)
O2 Saturation: 96 %
Patient temperature: 98.6
TCO2: 37 mmol/L (ref 0–100)
pCO2 arterial: 46.1 mmHg — ABNORMAL HIGH (ref 35.0–45.0)
pH, Arterial: 7.498 — ABNORMAL HIGH (ref 7.350–7.450)
pO2, Arterial: 78 mmHg — ABNORMAL LOW (ref 80.0–100.0)

## 2013-08-04 LAB — LIPASE, BLOOD: Lipase: 42 U/L (ref 11–59)

## 2013-08-04 LAB — GLUCOSE, CAPILLARY
Glucose-Capillary: 455 mg/dL — ABNORMAL HIGH (ref 70–99)
Glucose-Capillary: 424 mg/dL — ABNORMAL HIGH (ref 70–99)
Glucose-Capillary: 430 mg/dL — ABNORMAL HIGH (ref 70–99)
Glucose-Capillary: 542 mg/dL — ABNORMAL HIGH (ref 70–99)
Glucose-Capillary: 178 mg/dL — ABNORMAL HIGH (ref 70–99)
Glucose-Capillary: 256 mg/dL — ABNORMAL HIGH (ref 70–99)

## 2013-08-04 LAB — CBG MONITORING, ED
Glucose-Capillary: 587 mg/dL (ref 70–99)
Glucose-Capillary: 475 mg/dL — ABNORMAL HIGH (ref 70–99)

## 2013-08-04 LAB — PROTIME-INR
INR: 2.58 — ABNORMAL HIGH (ref 0.00–1.49)
Prothrombin Time: 26.8 seconds — ABNORMAL HIGH (ref 11.6–15.2)

## 2013-08-04 LAB — HEPATITIS B CORE ANTIBODY, TOTAL: Hep B Core Total Ab: NONREACTIVE

## 2013-08-04 LAB — MRSA PCR SCREENING: MRSA by PCR: NEGATIVE

## 2013-08-04 LAB — HEPATITIS B SURFACE ANTIGEN: Hepatitis B Surface Ag: NEGATIVE

## 2013-08-04 LAB — HEPATITIS B SURFACE ANTIBODY,QUALITATIVE: Hep B S Ab: NEGATIVE

## 2013-08-04 LAB — TROPONIN I: Troponin I: <0.3 ng/mL (ref <0.30)

## 2013-08-04 MED ORDER — SODIUM CHLORIDE 0.9 % IV SOLN: INTRAVENOUS | Status: AC

## 2013-08-04 MED ORDER — LIDOCAINE-PRILOCAINE 2.5-2.5 % EX CREA
1.0000 "application " | TOPICAL_CREAM | CUTANEOUS | Status: DC | PRN
  Administered 2013-08-04: 1 via TOPICAL
  Filled 2013-08-04: qty 5

## 2013-08-04 MED ORDER — INSULIN ASPART 100 UNIT/ML ~~LOC~~ SOLN
10.0000 [IU] | Freq: Three times a day (TID) | SUBCUTANEOUS | Status: DC
  Administered 2013-08-04 – 2013-08-07 (×4): 10 [IU] via SUBCUTANEOUS
  Administered 2013-08-07: 100 [IU] via SUBCUTANEOUS

## 2013-08-04 MED ORDER — HEPARIN SODIUM (PORCINE) 1000 UNIT/ML DIALYSIS: 20.0000 [IU]/kg | INTRAMUSCULAR | Status: DC | PRN

## 2013-08-04 MED ORDER — PENTAFLUOROPROP-TETRAFLUOROETH EX AERO: 1.0000 "application " | INHALATION_SPRAY | CUTANEOUS | Status: DC | PRN

## 2013-08-04 MED ORDER — WARFARIN - PHYSICIAN DOSING INPATIENT: Freq: Every day | Status: DC

## 2013-08-04 MED ORDER — SODIUM CHLORIDE 0.9 % IV SOLN
INTRAVENOUS | Status: DC
  Administered 2013-08-04 – 2013-08-06 (×2): via INTRAVENOUS

## 2013-08-04 MED ORDER — ONDANSETRON HCL 4 MG/2ML IJ SOLN
4.0000 mg | Freq: Three times a day (TID) | INTRAMUSCULAR | Status: AC | PRN
  Administered 2013-08-04: 4 mg via INTRAVENOUS
  Filled 2013-08-04: qty 2

## 2013-08-04 MED ORDER — SODIUM CHLORIDE 0.9 % IV SOLN
100.0000 mL | INTRAVENOUS | Status: DC | PRN
Start: 2013-08-04 — End: 2013-08-05
  Administered 2013-08-05: 100 mL via INTRAVENOUS

## 2013-08-04 MED ORDER — INSULIN ASPART 100 UNIT/ML IV SOLN
10.0000 [IU] | Freq: Once | INTRAVENOUS | Status: AC
  Administered 2013-08-04: 10 [IU] via INTRAVENOUS

## 2013-08-04 MED ORDER — ALTEPLASE 2 MG IJ SOLR
2.0000 mg | Freq: Once | INTRAMUSCULAR | Status: AC | PRN
  Filled 2013-08-04: qty 2

## 2013-08-04 MED ORDER — CARVEDILOL 25 MG PO TABS
25.0000 mg | ORAL_TABLET | Freq: Two times a day (BID) | ORAL | Status: DC
  Administered 2013-08-04 – 2013-08-08 (×6): 25 mg via ORAL
  Filled 2013-08-04 (×13): qty 1

## 2013-08-04 MED ORDER — INSULIN LISPRO 100 UNIT/ML ~~LOC~~ SOLN: 10.0000 [IU] | Freq: Two times a day (BID) | SUBCUTANEOUS | Status: DC

## 2013-08-04 MED ORDER — LIDOCAINE HCL (PF) 1 % IJ SOLN: 5.0000 mL | INTRAMUSCULAR | Status: DC | PRN

## 2013-08-04 MED ORDER — ONDANSETRON HCL 4 MG/2ML IJ SOLN
4.0000 mg | Freq: Four times a day (QID) | INTRAMUSCULAR | Status: DC | PRN
  Administered 2013-08-04 – 2013-08-08 (×5): 4 mg via INTRAVENOUS
  Filled 2013-08-04 (×4): qty 2

## 2013-08-04 MED ORDER — SODIUM CHLORIDE 0.9 % IV BOLUS (SEPSIS)
1000.0000 mL | Freq: Once | INTRAVENOUS | Status: AC
  Administered 2013-08-04: 1000 mL via INTRAVENOUS

## 2013-08-04 MED ORDER — INSULIN GLARGINE 100 UNIT/ML ~~LOC~~ SOLN
60.0000 [IU] | Freq: Every day | SUBCUTANEOUS | Status: DC
  Administered 2013-08-04 – 2013-08-05 (×2): 60 [IU] via SUBCUTANEOUS
  Filled 2013-08-04 (×4): qty 0.6

## 2013-08-04 MED ORDER — POLYSACCHARIDE IRON COMPLEX 150 MG PO CAPS
150.0000 mg | ORAL_CAPSULE | Freq: Every day | ORAL | Status: DC
  Administered 2013-08-04 – 2013-08-08 (×4): 150 mg via ORAL
  Filled 2013-08-04 (×6): qty 1

## 2013-08-04 MED ORDER — WARFARIN SODIUM 10 MG PO TABS
10.0000 mg | ORAL_TABLET | Freq: Every evening | ORAL | Status: DC
  Administered 2013-08-04 – 2013-08-05 (×2): 10 mg via ORAL
  Filled 2013-08-04 (×5): qty 1

## 2013-08-04 MED ORDER — POLYETHYLENE GLYCOL 3350 17 G PO PACK
17.0000 g | PACK | Freq: Every day | ORAL | Status: DC | PRN
  Filled 2013-08-04: qty 1

## 2013-08-04 MED ORDER — SODIUM CHLORIDE 0.9 % IV SOLN
INTRAVENOUS | Status: AC
  Administered 2013-08-04: 06:00:00 via INTRAVENOUS

## 2013-08-04 MED ORDER — ONDANSETRON HCL 4 MG PO TABS
4.0000 mg | ORAL_TABLET | Freq: Four times a day (QID) | ORAL | Status: DC | PRN
  Filled 2013-08-04: qty 1

## 2013-08-04 MED ORDER — HYDROCODONE-ACETAMINOPHEN 5-325 MG PO TABS
1.0000 | ORAL_TABLET | ORAL | Status: DC | PRN
  Administered 2013-08-04: 1 via ORAL
  Filled 2013-08-04: qty 1
  Filled 2013-08-04: qty 2

## 2013-08-04 MED ORDER — ACETAMINOPHEN 650 MG RE SUPP: 650.0000 mg | Freq: Four times a day (QID) | RECTAL | Status: DC | PRN

## 2013-08-04 MED ORDER — SODIUM CHLORIDE 0.9 % IV SOLN: 100.0000 mL | INTRAVENOUS | Status: DC | PRN

## 2013-08-04 MED ORDER — AMLODIPINE BESYLATE 5 MG PO TABS
5.0000 mg | ORAL_TABLET | Freq: Every day | ORAL | Status: DC
  Administered 2013-08-04 – 2013-08-08 (×4): 5 mg via ORAL
  Filled 2013-08-04 (×6): qty 1

## 2013-08-04 MED ORDER — INSULIN ASPART 100 UNIT/ML ~~LOC~~ SOLN: 3.0000 [IU] | Freq: Three times a day (TID) | SUBCUTANEOUS | Status: DC

## 2013-08-04 MED ORDER — ASPIRIN EC 81 MG PO TBEC
81.0000 mg | DELAYED_RELEASE_TABLET | Freq: Every day | ORAL | Status: DC
  Administered 2013-08-04 – 2013-08-07 (×4): 81 mg via ORAL
  Filled 2013-08-04 (×6): qty 1

## 2013-08-04 MED ORDER — INSULIN ASPART 100 UNIT/ML ~~LOC~~ SOLN
0.0000 [IU] | Freq: Three times a day (TID) | SUBCUTANEOUS | Status: DC
  Administered 2013-08-04: 15 [IU] via SUBCUTANEOUS
  Administered 2013-08-04: 3 [IU] via SUBCUTANEOUS
  Administered 2013-08-05: 2 [IU] via SUBCUTANEOUS
  Administered 2013-08-05 (×2): 3 [IU] via SUBCUTANEOUS
  Administered 2013-08-06: 2 [IU] via SUBCUTANEOUS
  Administered 2013-08-06: 3 [IU] via SUBCUTANEOUS
  Administered 2013-08-07: 8 [IU] via SUBCUTANEOUS
  Administered 2013-08-07: 5 [IU] via SUBCUTANEOUS

## 2013-08-04 MED ORDER — NEPRO/CARBSTEADY PO LIQD
237.0000 mL | ORAL | Status: DC | PRN
  Filled 2013-08-04: qty 237

## 2013-08-04 MED ORDER — SODIUM CHLORIDE 0.9 % IV SOLN
INTRAVENOUS | Status: DC
  Administered 2013-08-04: 06:00:00 via INTRAVENOUS
  Filled 2013-08-04: qty 1

## 2013-08-04 MED ORDER — ACETAMINOPHEN 325 MG PO TABS
650.0000 mg | ORAL_TABLET | Freq: Four times a day (QID) | ORAL | Status: DC | PRN
  Administered 2013-08-06: 650 mg via ORAL
  Filled 2013-08-04: qty 2

## 2013-08-04 MED ORDER — HEPARIN SODIUM (PORCINE) 1000 UNIT/ML DIALYSIS
1000.0000 [IU] | INTRAMUSCULAR | Status: DC | PRN
  Filled 2013-08-04: qty 1

## 2013-08-04 MED ORDER — INSULIN ASPART 100 UNIT/ML ~~LOC~~ SOLN
10.0000 [IU] | Freq: Two times a day (BID) | SUBCUTANEOUS | Status: DC
  Administered 2013-08-04: 10 [IU] via SUBCUTANEOUS

## 2013-08-04 MED ORDER — CALCITRIOL 0.25 MCG PO CAPS
0.2500 ug | ORAL_CAPSULE | Freq: Every day | ORAL | Status: DC
  Administered 2013-08-04 – 2013-08-08 (×5): 0.25 ug via ORAL
  Filled 2013-08-04 (×6): qty 1

## 2013-08-04 MED ORDER — ONDANSETRON HCL 4 MG/2ML IJ SOLN
4.0000 mg | Freq: Three times a day (TID) | INTRAMUSCULAR | Status: DC | PRN
  Filled 2013-08-04: qty 2

## 2013-08-04 NOTE — Progress Notes (Signed)
MD notified( AAronson) of CBG 532 orders to give 15 unit of insulin

## 2013-08-04 NOTE — Procedures (Signed)
He had an infiltration with the needle stick but is able to tolerate 200cc/hr. First treatment ever  Keymora Grillot C

## 2013-08-04 NOTE — ED Notes (Signed)
Pt to have dizziness noted when sitting up worsening on standing.  BP noted to drop during both laying to sitting and sitting to standing.   Orthostatic= Positive (+)

## 2013-08-04 NOTE — Care Management Note (Signed)
    Page 1 of 1   08/07/2013     3:29:35 PM   CARE MANAGEMENT NOTE 08/07/2013  Patient:  [name], [name]   Account Number:  [id number]  Date Initiated:  08/04/2013  Documentation initiated by:  AMERSON,JULIE  Subjective/Objective Assessment:   PT ADM ON 08/04/13 WITH DIZZINESS, HYPERGLYCEMIA, N/V.  PTA, PT INDEPENDENT OF ADLS.     Action/Plan:   WILL FOLLOW FOR DC PLANNING AS PT PROGRESSES.   Anticipated DC Date:  08/06/2013   Anticipated DC Plan:  [place]  CM consult      Choice offered to / List presented to:             Status of service:  In process, will continue to follow Medicare Important Message given?   (If response is "NO", the following Medicare IM given date fields will be blank) Date Medicare IM given:   Date Additional Medicare IM given:    Discharge Disposition:    Per UR Regulation:  Reviewed for med. necessity/level of care/duration of stay  If discussed at [place] of Stay Meetings, dates discussed:    Comments:  08-07-13 3:25pm Luz Lex, [place] [phone] Patient lives in [place] alone.  Son and daughter in law live in [place].  New dialysis in [place].  States makes too much disability for Medicaid.  Does have problems having enough money to pay for scrips at times.  Will need 4 dollar meds as much as possible.  Working on transportation to dialysis.  SW has given him community options that he is working on.  States this will be temporary till he can start driving self - suggested maybe church member not working may be able to assist.

## 2013-08-04 NOTE — Consult Note (Signed)
HPI: I was asked by Dr. Reynaldo Minium to see [name] who is a 63 y.o. male with advanced diabetic CKD followed by Dr. Posey Pronto at [facility] s/p left arm AVF admitted with nausea, vomiting, SOB and malaise. Labs remarkable for a BUN of 132 and creat of 8.63 mg / dl. We are asked to assist with management.  Past Medical History  Diagnosis Date  . Cancer   . DVT (deep venous thrombosis)     Right leg  . Diabetic retinopathy   . Hypertension   . Hyperlipidemia   . Non Hodgkin's lymphoma     Tx 2009  . History of cardiac catheterization     pt denies this on 03/15/2012  . Nodular lymphoma of intra-abdominal lymph nodes   . Shortness of breath   . Sleep apnea     "suppose to have a sleep studfy, but they never told me when.  . Peripheral vascular disease   . Blood transfusion   . 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  Marland Kitchen Headache(784.0)   . Noncompliance 03/16/2012  . CKD (chronic kidney disease) stage 4, GFR 15-29 ml/min 03/18/2012  . NSVT (nonsustained ventricular tachycardia) 03/18/2012  . Family history of anesthesia complication     " my son wakes up slowly"  . Pneumonia     hosp.- 2013  . Anemia   . GERD (gastroesophageal reflux disease)     uses alka seltzere on occas.   . Arthritis     HNP- lumbar  . Diabetes mellitus     insulin dependent   Past Surgical History  Procedure Laterality Date  . Porta catheter    . Porta catheter insertion      later removed  . Porta catheter removed    . Coloscopy    . Pars plana vitrectomy  08/27/2011    Procedure: PARS PLANA VITRECTOMY WITH 25 GAUGE;  Surgeon: Hayden Pedro, MD;  Location: [place];  Service: Ophthalmology;  Laterality: Left;  Repair of complex traction retinal detachment left eye  . Cardiac catheterization      pt denies this on 03/15/2012  . Eye surgery    . Viterectomy  05/10/2012    od  . Pars plana vitrectomy  05/10/2012    Procedure: PARS PLANA VITRECTOMY WITH 25 GAUGE;  Surgeon: Hayden Pedro,  MD;  Location: [place];  Service: Ophthalmology;  Laterality: Right;  Repair Complex Traction Retinal Detachment  . Membrane peel  05/10/2012    Procedure: MEMBRANE PEEL;  Surgeon: Hayden Pedro, MD;  Location: [place];  Service: Ophthalmology;  Laterality: Right;  . Photocoagulation with laser  05/10/2012    Procedure: PHOTOCOAGULATION WITH LASER;  Surgeon: Hayden Pedro, MD;  Location: [place];  Service: Ophthalmology;  Laterality: Right;  . Gas insertion  05/10/2012    Procedure: INSERTION OF GAS;  Surgeon: Hayden Pedro, MD;  Location: [place];  Service: Ophthalmology;  Laterality: Right;  . Ivc filter  09/2012    due to preparation for surgery  . Av fistula placement Left 02/03/2013    Procedure: ARTERIOVENOUS (AV) FISTULA CREATION- LEFT RADIAL CEPHALIC; ULTRASOUND GUIDED;  Surgeon: Mal Misty, MD;  Location: [place];  Service: Vascular;  Laterality: Left;   Social History:  reports that he has never smoked. He has never used smokeless tobacco. He reports that he does not drink alcohol or use illicit drugs. Allergies: No Known Allergies Family History  Problem Relation Age of  Onset  . Anesthesia problems Son   . Hypertension Son   . Diabetes Father   . Hypertension Father   . Other Father     amputation    Medications:  Scheduled: . sodium chloride   Intravenous STAT  . sodium chloride   Intravenous STAT  . amLODipine  5 mg Oral Daily  . aspirin EC  81 mg Oral Daily  . calcitRIOL  0.25 mcg Oral Daily  . carvedilol  25 mg Oral BID WC  . insulin aspart  0-15 Units Subcutaneous TID WC  . insulin aspart  10 Units Subcutaneous TID WC  . insulin glargine  60 Units Subcutaneous QHS  . iron polysaccharides  150 mg Oral Daily  . warfarin  10 mg Oral QPM  . Warfarin - Physician Dosing Inpatient   Does not apply q1800    ROS: as per HPI  Blood pressure 125/73, pulse 81, temperature 97.6 F (36.4 C), temperature source Oral, resp. rate 19, weight 117.3 kg (258 lb 9.6 oz), SpO2  100.00%.  General appearance: alert, cooperative and slowed mentation Head: Normocephalic, without obvious abnormality, atraumatic Eyes: negative Ears: normal TM's and external ear canals both ears Nose: Nares normal. Septum midline. Mucosa normal. No drainage or sinus tenderness. Throat: lips, mucosa, and tongue normal; teeth and gums normal Resp: clear to auscultation bilaterally Chest wall: no tenderness Cardio: regular rate and rhythm, S1, S2 normal, no murmur, click, rub or gallop GI: soft, non-tender; bowel sounds normal; no masses,  no organomegaly Extremities: edema 1+ and lue AVF Skin: Skin color, texture, turgor normal. No rashes or lesions Neurologic: Grossly normal Results for orders placed during the hospital encounter of 08/04/13 (from the past 48 hour(s))  CBG MONITORING, ED     Status: Abnormal   Collection Time    08/04/13  1:18 AM      Result Value Ref Range   Glucose-Capillary 587 (*) 70 - 99 mg/dL   Comment 1 Notify RN    CBC WITH DIFFERENTIAL     Status: Abnormal   Collection Time    08/04/13  2:00 AM      Result Value Ref Range   WBC 5.5  4.0 - 10.5 K/uL   RBC 4.63  4.22 - 5.81 MIL/uL   Hemoglobin 12.6 (*) 13.0 - 17.0 g/dL   HCT 35.3 (*) 39.0 - 52.0 %   MCV 76.2 (*) 78.0 - 100.0 fL   MCH 27.2  26.0 - 34.0 pg   MCHC 35.7  30.0 - 36.0 g/dL   RDW 14.1  11.5 - 15.5 %   Platelets 259  150 - 400 K/uL   Neutrophils Relative % 74  43 - 77 %   Neutro Abs 4.0  1.7 - 7.7 K/uL   Lymphocytes Relative 20  12 - 46 %   Lymphs Abs 1.1  0.7 - 4.0 K/uL   Monocytes Relative 5  3 - 12 %   Monocytes Absolute 0.3  0.1 - 1.0 K/uL   Eosinophils Relative 1  0 - 5 %   Eosinophils Absolute 0.0  0.0 - 0.7 K/uL   Basophils Relative 0  0 - 1 %   Basophils Absolute 0.0  0.0 - 0.1 K/uL  COMPREHENSIVE METABOLIC PANEL     Status: Abnormal   Collection Time    08/04/13  2:00 AM      Result Value Ref Range   Sodium 128 (*) 137 - 147 mEq/L   Potassium 3.5 (*) 3.7 - 5.3  mEq/L    Chloride 74 (*) 96 - 112 mEq/L   CO2 31  19 - 32 mEq/L   Glucose, Bld 618 (*) 70 - 99 mg/dL   Comment: CRITICAL RESULT CALLED TO, READ BACK BY AND VERIFIED WITH:     YUAL K,RN 08/04/13 0250 WAYK   BUN 132 (*) 6 - 23 mg/dL   Creatinine, Ser 8.63 (*) 0.50 - 1.35 mg/dL   Calcium 9.5  8.4 - 10.5 mg/dL   Total Protein 8.7 (*) 6.0 - 8.3 g/dL   Albumin 3.8  3.5 - 5.2 g/dL   AST 18  0 - 37 U/L   ALT 16  0 - 53 U/L   Alkaline Phosphatase 105  39 - 117 U/L   Total Bilirubin 0.3  0.3 - 1.2 mg/dL   GFR calc non Af Amer 6 (*) >90 mL/min   GFR calc Af Amer 7 (*) >90 mL/min   Comment: (NOTE)     The eGFR has been calculated using the CKD EPI equation.     This calculation has not been validated in all clinical situations.     eGFR's persistently <90 mL/min signify possible Chronic Kidney     Disease.  LIPASE, BLOOD     Status: None   Collection Time    08/04/13  2:00 AM      Result Value Ref Range   Lipase 42  11 - 59 U/L  TROPONIN I     Status: None   Collection Time    08/04/13  2:00 AM      Result Value Ref Range   Troponin I <0.30  <0.30 ng/mL   Comment:            Due to the release kinetics of cTnI,     a negative result within the first hours     of the onset of symptoms does not rule out     myocardial infarction with certainty.     If myocardial infarction is still suspected,     repeat the test at appropriate intervals.  URINALYSIS, ROUTINE W REFLEX MICROSCOPIC     Status: Abnormal   Collection Time    08/04/13  2:49 AM      Result Value Ref Range   Color, Urine YELLOW  YELLOW   APPearance CLEAR  CLEAR   Specific Gravity, Urine 1.013  1.005 - 1.030   pH 7.5  5.0 - 8.0   Glucose, UA 500 (*) NEGATIVE mg/dL   Hgb urine dipstick SMALL (*) NEGATIVE   Bilirubin Urine NEGATIVE  NEGATIVE   Ketones, ur NEGATIVE  NEGATIVE mg/dL   Protein, ur 100 (*) NEGATIVE mg/dL   Urobilinogen, UA 0.2  0.0 - 1.0 mg/dL   Nitrite NEGATIVE  NEGATIVE   Leukocytes, UA NEGATIVE  NEGATIVE  URINE  MICROSCOPIC-ADD ON     Status: None   Collection Time    08/04/13  2:49 AM      Result Value Ref Range   Squamous Epithelial / LPF RARE  RARE   WBC, UA 0-2  <3 WBC/hpf   RBC / HPF 3-6  <3 RBC/hpf  PROTIME-INR     Status: Abnormal   Collection Time    08/04/13  3:53 AM      Result Value Ref Range   Prothrombin Time 26.8 (*) 11.6 - 15.2 seconds   INR 2.58 (*) 0.00 - 1.49  CBG MONITORING, ED     Status: Abnormal   Collection Time  08/04/13  4:05 AM      Result Value Ref Range   Glucose-Capillary 475 (*) 70 - 99 mg/dL  I-STAT ARTERIAL BLOOD GAS, ED     Status: Abnormal   Collection Time    08/04/13  4:07 AM      Result Value Ref Range   pH, Arterial 7.498 (*) 7.350 - 7.450   pCO2 arterial 46.1 (*) 35.0 - 45.0 mmHg   pO2, Arterial 78.0 (*) 80.0 - 100.0 mmHg   Bicarbonate 35.8 (*) 20.0 - 24.0 mEq/L   TCO2 37  0 - 100 mmol/L   O2 Saturation 96.0     Acid-Base Excess 11.0 (*) 0.0 - 2.0 mmol/L   Patient temperature 98.6 F     Collection site RADIAL, ALLEN'S TEST ACCEPTABLE     Drawn by Operator     Sample type ARTERIAL    GLUCOSE, CAPILLARY     Status: Abnormal   Collection Time    08/04/13  7:36 AM      Result Value Ref Range   Glucose-Capillary 424 (*) 70 - 99 mg/dL  GLUCOSE, CAPILLARY     Status: Abnormal   Collection Time    08/04/13  8:40 AM      Result Value Ref Range   Glucose-Capillary 430 (*) 70 - 99 mg/dL  GLUCOSE, CAPILLARY     Status: Abnormal   Collection Time    08/04/13 11:01 AM      Result Value Ref Range   Glucose-Capillary 542 (*) 70 - 99 mg/dL   Comment 1 Notify RN     Dg Abd Acute W/chest  08/04/2013   CLINICAL DATA:  Epigastric pain.  Diarrhea.  EXAM: ACUTE ABDOMEN SERIES (ABDOMEN 2 VIEW & CHEST 1 VIEW)  COMPARISON:  06/09/2013 chest x-ray.  FINDINGS: Calcified aortic knob.  No infiltrate, congestive heart failure or pneumothorax.  Central pulmonary vascular prominence stable.  Heart size within normal limits.  Nonspecific bowel gas pattern without  plain film evidence of bowel obstruction or free intraperitoneal air.  Inferior vena cava filter is in place.  IMPRESSION: No plain film evidence of bowel obstruction or free intraperitoneal air. Please see above.   Electronically Signed   By: Chauncey Cruel [name]   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

## 2013-08-04 NOTE — ED Notes (Signed)
CBG 587

## 2013-08-04 NOTE — Progress Notes (Signed)
Utilization review completed. Leyah Bocchino, RN, BSN. 

## 2013-08-04 NOTE — Progress Notes (Signed)
Inpatient Diabetes Program Recommendations  AACE/ADA: New Consensus Statement on Inpatient Glycemic Control (2013)  Target Ranges:  Prepandial:   less than 140 mg/dL      Peak postprandial:   less than 180 mg/dL (1-2 hours)      Critically ill patients:  140 - 180 mg/dL   Reason for Assessment: Results for GIOVANNE, LINNANE (MRN [id number]) as of 08/04/2013 16:06  Ref. Range 08/04/2013 01:18 08/04/2013 04:05 08/04/2013 07:36 08/04/2013 08:40 08/04/2013 11:01  Glucose-Capillary Latest Range: 70-99 mg/dL 587 (HH) 475 (H) 424 (H) 430 (H) 542 (H)   Diabetes history: Type 2 diabetes Outpatient Diabetes medications: Lantus 60 units daily, Novolog 10 units bid with meals Current orders for Inpatient glycemic control: Lantus 60 units q HS, Novolog 10 units tid with meals, and Novolog Moderate tid with meals    Note patient has received several doses of Novolog today however CBG's have continued to be elevated.  Currently he is in dialysis and this hopefully will help CBG's.  Discussed with RN.  She will call MD if CBG continues to be elevated on next CBG check.  May consider IV insulin/Glucostabilizer.  Patient also has not received any Lantus since admission but is due to get Lantus tonight.    Adah Perl, RN, BC-ADM Inpatient Diabetes Coordinator Pager [phone]

## 2013-08-04 NOTE — ED Notes (Signed)
Pt reports dizziness, nausea, and abdominal pain since last night. Pt has DMII and has not check his blood sugar in a couple of days. States he has not been feeling well and has not eaten much. CBG on arrival 587.

## 2013-08-04 NOTE — ED Provider Notes (Signed)
CSN: [id number]     Arrival date & time 08/04/13  0100 History   First MD Initiated Contact with Patient 08/04/13 0106     Chief Complaint  Patient presents with  . Abdominal Pain  . Dizziness     (Consider location/radiation/quality/duration/timing/severity/associated sxs/prior Treatment) HPI Patient has had one day of nausea with dry heaving and lightheadedness. He's had some mild upper abdominal pain but he states this is after retching. He currently denies any pain. He has not had any chest pain or shortness of breath. Patient with recent normal bowel movements. Patient has a history of renal disease but is yet to start dialysis. Generalized weakness and fatigue without any focal weakness or numbness. Past Medical History  Diagnosis Date  . Cancer   . DVT (deep venous thrombosis)     Right leg  . Diabetic retinopathy   . Hypertension   . Hyperlipidemia   . Non Hodgkin's lymphoma     Tx 2009  . History of cardiac catheterization     pt denies this on 03/15/2012  . Nodular lymphoma of intra-abdominal lymph nodes   . Shortness of breath   . Sleep apnea     "suppose to have a sleep studfy, but they never told me when.  . Peripheral vascular disease   . Blood transfusion   . 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  Marland Kitchen Headache(784.0)   . Noncompliance 03/16/2012  . CKD (chronic kidney disease) stage 4, GFR 15-29 ml/min 03/18/2012  . NSVT (nonsustained ventricular tachycardia) 03/18/2012  . Family history of anesthesia complication     " my son wakes up slowly"  . Pneumonia     hosp.- 2013  . Anemia   . GERD (gastroesophageal reflux disease)     uses alka seltzere on occas.   . Arthritis     HNP- lumbar  . Diabetes mellitus     insulin dependent   Past Surgical History  Procedure Laterality Date  . Porta catheter    . Porta catheter insertion      later removed  . Porta catheter removed    . Coloscopy    . Pars plana vitrectomy  08/27/2011     Procedure: PARS PLANA VITRECTOMY WITH 25 GAUGE;  Surgeon: Hayden Pedro, MD;  Location: [place];  Service: Ophthalmology;  Laterality: Left;  Repair of complex traction retinal detachment left eye  . Cardiac catheterization      pt denies this on 03/15/2012  . Eye surgery    . Viterectomy  05/10/2012    od  . Pars plana vitrectomy  05/10/2012    Procedure: PARS PLANA VITRECTOMY WITH 25 GAUGE;  Surgeon: Hayden Pedro, MD;  Location: [place];  Service: Ophthalmology;  Laterality: Right;  Repair Complex Traction Retinal Detachment  . Membrane peel  05/10/2012    Procedure: MEMBRANE PEEL;  Surgeon: Hayden Pedro, MD;  Location: [place];  Service: Ophthalmology;  Laterality: Right;  . Photocoagulation with laser  05/10/2012    Procedure: PHOTOCOAGULATION WITH LASER;  Surgeon: Hayden Pedro, MD;  Location: [place];  Service: Ophthalmology;  Laterality: Right;  . Gas insertion  05/10/2012    Procedure: INSERTION OF GAS;  Surgeon: Hayden Pedro, MD;  Location: [place];  Service: Ophthalmology;  Laterality: Right;  . Ivc filter  09/2012    due to preparation for surgery  . Av fistula placement Left 02/03/2013    Procedure: ARTERIOVENOUS (AV) FISTULA CREATION- LEFT  RADIAL CEPHALIC; ULTRASOUND GUIDED;  Surgeon: Mal Misty, MD;  Location: [facility];  Service: Vascular;  Laterality: Left;   Family History  Problem Relation Age of Onset  . Anesthesia problems Son   . Hypertension Son   . Diabetes Father   . Hypertension Father   . Other Father     amputation   History  Substance Use Topics  . Smoking status: Never Smoker   . Smokeless tobacco: Never Used  . Alcohol Use: No    Review of Systems  Constitutional: Positive for fatigue. Negative for fever and chills.  Respiratory: Negative for shortness of breath.   Cardiovascular: Negative for chest pain.  Gastrointestinal: Positive for nausea and abdominal pain. Negative for vomiting, diarrhea and constipation.  Genitourinary: Positive for  frequency. Negative for dysuria and flank pain.  Musculoskeletal: Negative for back pain, myalgias, neck pain and neck stiffness.  Skin: Negative for rash and wound.  Neurological: Positive for dizziness and light-headedness. Negative for syncope, weakness, numbness and headaches.  All other systems reviewed and are negative.      Allergies  Review of patient's allergies indicates no known allergies.  Home Medications   Current Outpatient Rx  Name  Route  Sig  Dispense  Refill  . amLODipine (NORVASC) 10 MG tablet      TAKE ONE TABLET BY MOUTH AT BEDTIME   30 tablet   0     Needs appointment   . calcitRIOL (ROCALTROL) 0.25 MCG capsule   Oral   Take 0.25 mcg by mouth daily.           . carvedilol (COREG) 25 MG tablet   Oral   Take 25 mg by mouth 2 (two) times daily with a meal.         . furosemide (LASIX) 80 MG tablet   Oral   Take 80 mg by mouth 2 (two) times daily.         . insulin glargine (LANTUS) 100 UNIT/ML injection   Subcutaneous   Inject 60 Units into the skin at bedtime.         . insulin lispro (HUMALOG) 100 UNIT/ML injection   Subcutaneous   Inject 10 Units into the skin 2 (two) times daily.         Marland Kitchen KLOR-CON M20 20 MEQ tablet   Oral   Take 20 mEq by mouth Daily.          . polysaccharide iron (NIFEREX) 150 MG CAPS capsule   Oral   Take 1 capsule (150 mg total) by mouth daily.   30 each   2   . promethazine (PHENERGAN) 25 MG tablet   Oral   Take 1 tablet by mouth every 6 (six) hours as needed.         . warfarin (COUMADIN) 5 MG tablet   Oral   Take 10 mg by mouth every evening.           BP 147/80  Pulse 88  Temp(Src) 97.6 F (36.4 C) (Oral)  Resp 16  SpO2 96% Physical Exam  Nursing note and vitals reviewed. Constitutional: He is oriented to person, place, and time. He appears well-developed and well-nourished. No distress.  HENT:  Head: Normocephalic and atraumatic.  Mouth/Throat: Oropharynx is clear and moist.   Eyes: EOM are normal. Pupils are equal, round, and reactive to light.  No nystagmus  Neck: Normal range of motion. Neck supple.  Cardiovascular: Normal rate and regular rhythm.   Pulmonary/Chest: Effort  normal and breath sounds normal. No respiratory distress. He has no wheezes. He has no rales.  Abdominal: Soft. Bowel sounds are normal. He exhibits no distension and no mass. There is no tenderness. There is no rebound and no guarding.  Musculoskeletal: Normal range of motion. He exhibits no edema and no tenderness.  Neurological: He is alert and oriented to person, place, and time.  Refill extremities without deficit. Sensation is grossly intact.  Skin: Skin is warm and dry. No rash noted. No erythema.  Psychiatric: He has a normal mood and affect. His behavior is normal.    ED Course  Procedures (including critical care time) Labs Review Labs Reviewed  CBC WITH DIFFERENTIAL - Abnormal; Notable for the following:    Hemoglobin 12.6 (*)    HCT 35.3 (*)    MCV 76.2 (*)    All other components within normal limits  COMPREHENSIVE METABOLIC PANEL - Abnormal; Notable for the following:    Sodium 128 (*)    Potassium 3.5 (*)    Chloride 74 (*)    Glucose, Bld 618 (*)    BUN 132 (*)    Creatinine, Ser 8.63 (*)    Total Protein 8.7 (*)    GFR calc non Af Amer 6 (*)    GFR calc Af Amer 7 (*)    All other components within normal limits  URINALYSIS, ROUTINE W REFLEX MICROSCOPIC - Abnormal; Notable for the following:    Glucose, UA 500 (*)    Hgb urine dipstick SMALL (*)    Protein, ur 100 (*)    All other components within normal limits  CBG MONITORING, ED - Abnormal; Notable for the following:    Glucose-Capillary 587 (*)    All other components within normal limits  LIPASE, BLOOD  TROPONIN I  URINE MICROSCOPIC-ADD ON   Imaging Review Dg Abd Acute W/chest  08/04/2013   CLINICAL DATA:  Epigastric pain.  Diarrhea.  EXAM: ACUTE ABDOMEN SERIES (ABDOMEN 2 VIEW & CHEST 1 VIEW)   COMPARISON:  06/09/2013 chest x-ray.  FINDINGS: Calcified aortic knob.  No infiltrate, congestive heart failure or pneumothorax.  Central pulmonary vascular prominence stable.  Heart size within normal limits.  Nonspecific bowel gas pattern without plain film evidence of bowel obstruction or free intraperitoneal air.  Inferior vena cava filter is in place.  IMPRESSION: No plain film evidence of bowel obstruction or free intraperitoneal air. Please see above.   Electronically Signed   By: Chauncey Cruel [name]   On: 08/04/2013 03:00     EKG Interpretation   Date/Time:  Friday August 04 2013 01:16:27 EST Ventricular Rate:  88 PR Interval:  182 QRS Duration: 166 QT Interval:  454 QTC Calculation: 549 R Axis:   -78 Text Interpretation:  Age not entered, assumed to be  63 years old for  purpose of ECG interpretation Sinus rhythm Probable left atrial  enlargement RBBB and LAFB Left ventricular hypertrophy Baseline wander in  lead(s) V2 Confirmed by Lita Mains  MD, Heer Justiss ([id number]) on 08/04/2013 4:58:58  AM      MDM   Final diagnoses:  None    Patient became profoundly hypertensive when standing. We'll continue IV fluids and has been switched to a insulin drip. Discussed with Dr. Dagmar Hait. Asked to have temporary orders placed and Dr. Reynaldo Minium will see the patient on the floor.    Julianne Rice, MD 08/04/13 0500

## 2013-08-04 NOTE — ED Notes (Signed)
Dizziness, vomiting and upper abd. Pain.  Has an av graft but not receiving dialysis.

## 2013-08-04 NOTE — H&P (Signed)
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 The initial story was that he was vomiting however he reports no vomiting just generalized listlessness, dizziness and poor intake because of no appetite. He's had no increasing fluid accumulation or shortness of breath. He has had dialysis access placed several weeks ago in his left wrist. He's having no chest pain or shortness of breath. No fever chills or night sweats. He started markedly uremic and hyperglycemic and is admitted for further management. He has been taking his insulin but has not been checking his blood sugars.  Past Medical History: Past Medical History  Diagnosis Date  . Cancer   . DVT (deep venous thrombosis)     Right leg  . Diabetic retinopathy   . Hypertension   . Hyperlipidemia   . Non Hodgkin's lymphoma     Tx 2009  . History of cardiac catheterization     pt denies this on 03/15/2012  . Nodular lymphoma of intra-abdominal lymph nodes   . Shortness of breath   . Sleep apnea     "suppose to have [name] sleep studfy, but they never told me when.  . Peripheral vascular disease   . Blood transfusion   . 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  Marland Kitchen Headache(784.0)   . Noncompliance 03/16/2012  . CKD (chronic kidney disease) stage 4, GFR 15-29 ml/min 03/18/2012  . NSVT (nonsustained ventricular tachycardia) 03/18/2012  . Family history of anesthesia complication     " my son wakes up slowly"  . Pneumonia     hosp.- 2013  . Anemia   . GERD  (gastroesophageal reflux disease)     uses alka seltzere on occas.   . Arthritis     HNP- lumbar  . Diabetes mellitus     insulin dependent   Past Surgical History  Procedure Laterality Date  . Porta catheter    . Porta catheter insertion      later removed  . Porta catheter removed    . Coloscopy    . Pars plana vitrectomy  08/27/2011    Procedure: PARS PLANA VITRECTOMY WITH 25 GAUGE;  Surgeon: Hayden Pedro, MD;  Location: [place];  Service: Ophthalmology;  Laterality: Left;  Repair of complex traction retinal detachment left eye  . Cardiac catheterization      pt denies this on 03/15/2012  . Eye surgery    . Viterectomy  05/10/2012    od  . Pars plana vitrectomy  05/10/2012    Procedure: PARS PLANA VITRECTOMY WITH 25 GAUGE;  Surgeon: Hayden Pedro, MD;  Location: [place];  Service: Ophthalmology;  Laterality: Right;  Repair Complex Traction Retinal Detachment  . Membrane peel  05/10/2012    Procedure: MEMBRANE PEEL;  Surgeon: Hayden Pedro, MD;  Location: [place];  Service: Ophthalmology;  Laterality: Right;  . Photocoagulation with laser  05/10/2012    Procedure: PHOTOCOAGULATION WITH LASER;  Surgeon: Hayden Pedro, MD;  Location: [facility];  Service: Ophthalmology;  Laterality: Right;  . Gas insertion  05/10/2012    Procedure: INSERTION OF GAS;  Surgeon: Hayden Pedro, MD;  Location: [place];  Service: Ophthalmology;  Laterality: Right;  . Ivc filter  09/2012    due to preparation for surgery  . Av fistula placement Left 02/03/2013    Procedure: ARTERIOVENOUS (AV) FISTULA CREATION- LEFT RADIAL CEPHALIC; ULTRASOUND GUIDED;  Surgeon: Mal Misty, MD;  Location: [facility];  Service: Vascular;  Laterality: Left;    Medications: Prior to Admission medications   Medication Sig Start Date End Date Taking? Authorizing Provider  amLODipine (NORVASC) 10 MG tablet TAKE ONE TABLET BY MOUTH AT BEDTIME 07/24/13  Yes Lorretta Harp, MD  calcitRIOL (ROCALTROL) 0.25 MCG capsule Take 0.25 mcg by  mouth daily.     Yes Historical Provider, MD  carvedilol (COREG) 25 MG tablet Take 25 mg by mouth 2 (two) times daily with [name].   Yes Historical Provider, MD  furosemide (LASIX) 80 MG tablet Take 80 mg by mouth 2 (two) times daily.   Yes Historical Provider, MD  insulin glargine (LANTUS) 100 UNIT/ML injection Inject 60 Units into the skin at bedtime.   Yes Historical Provider, MD  insulin lispro (HUMALOG) 100 UNIT/ML injection Inject 10 Units into the skin 2 (two) times daily.   Yes Historical Provider, MD  KLOR-CON M20 20 MEQ tablet Take 20 mEq by mouth Daily.  04/11/12  Yes Historical Provider, MD  polysaccharide iron (NIFEREX) 150 MG CAPS capsule Take 1 capsule (150 mg total) by mouth daily. 01/16/11  Yes Erline Hau, MD  warfarin (COUMADIN) 5 MG tablet Take 10 mg by mouth every evening.    Yes Historical Provider, MD    Allergies:  No Known Allergies  Social History:  reports that he has never smoked. He has never used smokeless tobacco. He reports that he does not drink alcohol or use illicit drugs.  Family History: Family History  Problem Relation Age of Onset  . Anesthesia problems Son   . Hypertension Son   . Diabetes Father   . Hypertension Father   . Other Father     amputation    Physical Exam: Filed Vitals:   08/04/13 0414 08/04/13 0415 08/04/13 0430 08/04/13 0515  BP: 72/48 131/73 139/73 122/66  Pulse: 87 85 83 82  Temp:      TempSrc:      Resp:  18 16 19   SpO2:  94% 93% 91%   General appearance: alert, cooperative and no distress Head: Normocephalic, without obvious abnormality, atraumatic Eyes: conjunctivae/corneas clear. PERRL, EOM's intact.  Nose: Nares normal. Septum midline. Mucosa normal. No drainage or sinus tenderness. Throat: lips, mucosa, and tongue normal; teeth and gums normal Neck: no adenopathy, no carotid bruit, no JVD and thyroid not enlarged, symmetric, no tenderness/mass/nodules Resp: clear to auscultation bilaterally Cardio:  regular rate and rhythm, no rub he does have [name] 2/6 systolic ejection murmur GI: soft, non-tender; bowel sounds normal; no masses,  no organomegaly Extremities: extremities normal, atraumatic, no cyanosis or edema Pulses: 2+ and symmetric, dialysis accesses left wrist with good thrill Lymph nodes: Cervical adenopathy: no cervical lymphadenopathy Neurologic: Alert and oriented X 3, normal strength and tone. Normal symmetric reflexes.     Labs on Admission:   Recent Labs  08/04/13 0200  NA 128*  K 3.5*  CL 74*  CO2 31  GLUCOSE 618*  BUN 132*  CREATININE 8.63*  CALCIUM 9.5    Recent Labs  08/04/13 0200  AST 18  ALT 16  ALKPHOS 105  BILITOT 0.3  PROT 8.7*  ALBUMIN 3.8    Recent Labs  08/04/13 0200  LIPASE 42    Recent Labs  08/04/13 0200  WBC 5.5  NEUTROABS 4.0  HGB 12.6*  HCT 35.3*  MCV 76.2*  PLT 259    Recent Labs  08/04/13 0200  TROPONINI <0.30   No results found for this basename: TSH, T4TOTAL, FREET3, T3FREE, THYROIDAB,  in the last 72 hours No results found for this basename: VITAMINB12, FOLATE, FERRITIN, TIBC, IRON, RETICCTPCT,  in the last 72 hours  Radiological Exams on Admission: Dg Abd Acute W/chest  08/04/2013   CLINICAL DATA:  Epigastric pain.  Diarrhea.  EXAM: ACUTE ABDOMEN SERIES (ABDOMEN 2 VIEW & CHEST 1 VIEW)  COMPARISON:  06/09/2013 chest x-ray.  FINDINGS: Calcified aortic knob.  No infiltrate, congestive heart failure or pneumothorax.  Central pulmonary vascular prominence stable.  Heart size within normal limits.  Nonspecific bowel gas pattern without plain film evidence of bowel obstruction or free intraperitoneal air.  Inferior vena cava filter is in place.  IMPRESSION: No plain film evidence of bowel obstruction or free intraperitoneal air. Please see above.   Electronically Signed   By: Chauncey Cruel [name]   On: 08/04/2013 03:00   Orders placed during the hospital encounter of 08/04/13  . EKG 12-LEAD  . EKG 12-LEAD  . ED EKG  . ED  EKG    Assessment/Plan Active Problems:   Hypertension- stable   Non Hodgkin's lymphoma   Diabetic retinopathy   Diabetic nephropathy   DVT (deep venous thrombosis)   Proliferative diabetic retinopathy associated with type 2 diabetes mellitus   End stage renal disease- believe that he said he failure to thrive picture and poor intake leading to some volume depletion because of uremia despite the absence of rub and hyperkalemia. I believe he is at the point where dialysis would help him markedly and is more dialysis mission than anything else at this point.   Hyperglycemia   Uremia   Will hydrate hydrate gently. The volume overload and consult renal for dialysis he is clearly not in DKA and blood sugars are easily correctable   John [name] 08/04/2013, 8:29 AM

## 2013-08-04 NOTE — ED Notes (Signed)
Patient transported to X-ray 

## 2013-08-05 ENCOUNTER — Encounter (HOSPITAL_COMMUNITY): Payer: Self-pay | Admitting: General Practice

## 2013-08-05 DIAGNOSIS — I10 Essential (primary) hypertension: Secondary | ICD-10-CM | POA: Diagnosis not present

## 2013-08-05 DIAGNOSIS — N186 End stage renal disease: Secondary | ICD-10-CM | POA: Diagnosis not present

## 2013-08-05 DIAGNOSIS — E1129 Type 2 diabetes mellitus with other diabetic kidney complication: Secondary | ICD-10-CM | POA: Diagnosis not present

## 2013-08-05 DIAGNOSIS — N19 Unspecified kidney failure: Secondary | ICD-10-CM | POA: Diagnosis not present

## 2013-08-05 DIAGNOSIS — I82409 Acute embolism and thrombosis of unspecified deep veins of unspecified lower extremity: Secondary | ICD-10-CM | POA: Diagnosis not present

## 2013-08-05 DIAGNOSIS — R7309 Other abnormal glucose: Secondary | ICD-10-CM | POA: Diagnosis not present

## 2013-08-05 LAB — CBC
HCT: 33.7 % — ABNORMAL LOW (ref 39.0–52.0)
Hemoglobin: 11.3 g/dL — ABNORMAL LOW (ref 13.0–17.0)
MCH: 26.2 pg (ref 26.0–34.0)
MCHC: 33.5 g/dL (ref 30.0–36.0)
MCV: 78 fL (ref 78.0–100.0)
Platelets: 233 10*3/uL (ref 150–400)
RBC: 4.32 MIL/uL (ref 4.22–5.81)
RDW: 14.3 % (ref 11.5–15.5)
WBC: 5.3 10*3/uL (ref 4.0–10.5)

## 2013-08-05 LAB — BASIC METABOLIC PANEL
BUN: 73 mg/dL — ABNORMAL HIGH (ref 6–23)
CO2: 25 mEq/L (ref 19–32)
Calcium: 8.3 mg/dL — ABNORMAL LOW (ref 8.4–10.5)
Chloride: 95 mEq/L — ABNORMAL LOW (ref 96–112)
Creatinine, Ser: 6.21 mg/dL — ABNORMAL HIGH (ref 0.50–1.35)
GFR calc Af Amer: 10 mL/min — ABNORMAL LOW (ref >90)
GFR calc non Af Amer: 9 mL/min — ABNORMAL LOW (ref >90)
Glucose, Bld: 194 mg/dL — ABNORMAL HIGH (ref 70–99)
Potassium: 2.8 mEq/L — CL (ref 3.7–5.3)
Sodium: 137 mEq/L (ref 137–147)

## 2013-08-05 LAB — PROTIME-INR
INR: 3.04 — ABNORMAL HIGH (ref 0.00–1.49)
Prothrombin Time: 30.4 seconds — ABNORMAL HIGH (ref 11.6–15.2)

## 2013-08-05 LAB — GLUCOSE, CAPILLARY
Glucose-Capillary: 182 mg/dL — ABNORMAL HIGH (ref 70–99)
Glucose-Capillary: 169 mg/dL — ABNORMAL HIGH (ref 70–99)
Glucose-Capillary: 121 mg/dL — ABNORMAL HIGH (ref 70–99)
Glucose-Capillary: 171 mg/dL — ABNORMAL HIGH (ref 70–99)

## 2013-08-05 MED ORDER — SODIUM CHLORIDE 0.9 % IV SOLN
1020.0000 mg | Freq: Once | INTRAVENOUS | Status: AC
  Administered 2013-08-05: 1020 mg via INTRAVENOUS
  Filled 2013-08-05: qty 34

## 2013-08-05 NOTE — Progress Notes (Signed)
Subjective: Had his first HD yesterday.  Feels a bit washed out.  I was called with "panic value" of K+ 2.8 early this AM.  Objective: Vital signs in last 24 hours: Temp:  [97.6 F (36.4 C)-98 F (36.7 C)] 97.8 F (36.6 C) (03/07 0449) Pulse Rate:  [78-85] 85 (03/07 0449) Resp:  [16-21] 18 (03/07 0449) BP: (95-166)/(51-78) 166/78 mmHg (03/07 0449) SpO2:  [99 %-100 %] 99 % (03/07 0449) Weight:  [117.3 kg (258 lb 9.6 oz)-119.4 kg (263 lb 3.7 oz)] 118 kg (260 lb 2.3 oz) (03/07 0457) Weight change:  Last BM Date: 08/03/13  Intake/Output from previous day: 03/06 0701 - 03/07 0700 In: 1595 [I.V.:1595] Out: 2950 [Urine:1450] Intake/Output this shift:   General appearance: alert, cooperative and slowed mentation  Head: Normocephalic, without obvious abnormality, atraumatic  Throat: lips, mucosa, and tongue normal; teeth and gums normal  Resp: clear to auscultation bilaterally  Chest wall: no tenderness  Cardio: regular rate and rhythm, S1, S2 normal, no murmur, click, rub or gallop  GI: soft, non-tender; bowel sounds normal; no masses, no organomegaly  Extremities: edema 1+ and lue AVF dressed. Skin: Skin color, texture, turgor normal. No rashes or lesions  Neurologic: Grossly normal  Lab Results:  Recent Labs  08/04/13 0200 08/05/13 0342  WBC 5.5 5.3  HGB 12.6* 11.3*  HCT 35.3* 33.7*  PLT 259 233   BMET  Recent Labs  08/04/13 0200 08/05/13 0342  NA 128* 137  K 3.5* 2.8*  CL 74* 95*  CO2 31 25  GLUCOSE 618* 194*  BUN 132* 73*  CREATININE 8.63* 6.21*  CALCIUM 9.5 8.3*    Studies/Results: Dg Abd Acute W/chest  08/04/2013   CLINICAL DATA:  Epigastric pain.  Diarrhea.  EXAM: ACUTE ABDOMEN SERIES (ABDOMEN 2 VIEW & CHEST 1 VIEW)  COMPARISON:  06/09/2013 chest x-ray.  FINDINGS: Calcified aortic knob.  No infiltrate, congestive heart failure or pneumothorax.  Central pulmonary vascular prominence stable.  Heart size within normal limits.  Nonspecific bowel gas pattern  without plain film evidence of bowel obstruction or free intraperitoneal air.  Inferior vena cava filter is in place.  IMPRESSION: No plain film evidence of bowel obstruction or free intraperitoneal air. Please see above.   Electronically Signed   By: Chauncey Cruel [name]   On: 08/04/2013 03:00    Medications:  I have reviewed the patient's current medications. Scheduled: . amLODipine  5 mg Oral Daily  . aspirin EC  81 mg Oral Daily  . calcitRIOL  0.25 mcg Oral Daily  . carvedilol  25 mg Oral BID WC  . insulin aspart  0-15 Units Subcutaneous TID WC  . insulin aspart  10 Units Subcutaneous TID WC  . insulin glargine  60 Units Subcutaneous QHS  . iron polysaccharides  150 mg Oral Daily  . warfarin  10 mg Oral QPM  . Warfarin - Physician Dosing Inpatient   Does not apply q1800   Continuous: . sodium chloride 75 mL/hr at 08/05/13 0600   [id number] chloride, sodium chloride, acetaminophen, acetaminophen, feeding supplement (NEPRO CARB STEADY), heparin, HYDROcodone-acetaminophen, lidocaine (PF), lidocaine-prilocaine, ondansetron (ZOFRAN) IV, ondansetron, pentafluoroprop-tetrafluoroeth, polyethylene glycol  Assessment/Plan: Uremic Syndrome:  HD per Renal with plans for [facility] post discharge. Hypokalemia:  No adjustment with PO's, will need Renal to adjust HD. DM2 with retinopathy:  Poor historic control, SSI, compliance  HTN: Stable NHL: In remission   LOS: 1 day   Notnamed Scholz W 08/05/2013, 8:29 AM

## 2013-08-05 NOTE — Progress Notes (Signed)
Called Dr. Pearson Grippe lab unable to get labs that were ordered no available site.  Waiting for call back for further orders.

## 2013-08-05 NOTE — Procedures (Signed)
I was present at this dialysis session. I have reviewed the session itself and made appropriate changes.   Tx #2.  Plan for #3 on 3/9 and then MWF.  Add on Phos and PTH.    Pearson Grippe  MD 08/05/2013, 2:06 PM

## 2013-08-05 NOTE — Progress Notes (Signed)
Labs are to be drawn when patient has dialysis again/Dr. Joelyn Oms

## 2013-08-05 NOTE — Progress Notes (Signed)
CRITICAL VALUE ALERT  Critical value received:  Potassium 2.8  Date of notification:  08/05/13  Time of notification:  05:46  Critical value read back:yes  Nurse who received alert:   A.Pasty Spillers  MD notified (1st page):  Starr Lake, MD  Time of first page:  05:51  MD notified (2nd page):  Time of second page:  Responding MD:  Starr Lake, MD   Time MD responded:  05:54

## 2013-08-06 DIAGNOSIS — N19 Unspecified kidney failure: Secondary | ICD-10-CM | POA: Diagnosis not present

## 2013-08-06 DIAGNOSIS — R7309 Other abnormal glucose: Secondary | ICD-10-CM | POA: Diagnosis not present

## 2013-08-06 DIAGNOSIS — I82409 Acute embolism and thrombosis of unspecified deep veins of unspecified lower extremity: Secondary | ICD-10-CM | POA: Diagnosis not present

## 2013-08-06 DIAGNOSIS — N186 End stage renal disease: Secondary | ICD-10-CM | POA: Diagnosis not present

## 2013-08-06 DIAGNOSIS — E1129 Type 2 diabetes mellitus with other diabetic kidney complication: Secondary | ICD-10-CM | POA: Diagnosis not present

## 2013-08-06 DIAGNOSIS — I10 Essential (primary) hypertension: Secondary | ICD-10-CM | POA: Diagnosis not present

## 2013-08-06 LAB — CBC
HCT: 34.6 % — ABNORMAL LOW (ref 39.0–52.0)
Hemoglobin: 11.7 g/dL — ABNORMAL LOW (ref 13.0–17.0)
MCH: 26.8 pg (ref 26.0–34.0)
MCHC: 33.8 g/dL (ref 30.0–36.0)
MCV: 79.2 fL (ref 78.0–100.0)
Platelets: 230 10*3/uL (ref 150–400)
RBC: 4.37 MIL/uL (ref 4.22–5.81)
RDW: 14.3 % (ref 11.5–15.5)
WBC: 4.7 10*3/uL (ref 4.0–10.5)

## 2013-08-06 LAB — GLUCOSE, CAPILLARY
Glucose-Capillary: 61 mg/dL — ABNORMAL LOW (ref 70–99)
Glucose-Capillary: 88 mg/dL (ref 70–99)
Glucose-Capillary: 183 mg/dL — ABNORMAL HIGH (ref 70–99)
Glucose-Capillary: 136 mg/dL — ABNORMAL HIGH (ref 70–99)

## 2013-08-06 LAB — BASIC METABOLIC PANEL
BUN: 47 mg/dL — ABNORMAL HIGH (ref 6–23)
CO2: 26 mEq/L (ref 19–32)
Calcium: 9.1 mg/dL (ref 8.4–10.5)
Chloride: 95 mEq/L — ABNORMAL LOW (ref 96–112)
Creatinine, Ser: 5.23 mg/dL — ABNORMAL HIGH (ref 0.50–1.35)
GFR calc Af Amer: 13 mL/min — ABNORMAL LOW (ref >90)
GFR calc non Af Amer: 11 mL/min — ABNORMAL LOW (ref >90)
Glucose, Bld: 82 mg/dL (ref 70–99)
Potassium: 3.1 mEq/L — ABNORMAL LOW (ref 3.7–5.3)
Sodium: 136 mEq/L — ABNORMAL LOW (ref 137–147)

## 2013-08-06 LAB — PROTIME-INR
INR: 3.43 — ABNORMAL HIGH (ref 0.00–1.49)
Prothrombin Time: 33.3 seconds — ABNORMAL HIGH (ref 11.6–15.2)

## 2013-08-06 MED ORDER — WARFARIN SODIUM 10 MG PO TABS
10.0000 mg | ORAL_TABLET | Freq: Every evening | ORAL | Status: DC
  Administered 2013-08-07: 10 mg via ORAL
  Filled 2013-08-06 (×2): qty 1

## 2013-08-06 MED ORDER — GLUCOSE 40 % PO GEL
ORAL | Status: AC
  Filled 2013-08-06: qty 1

## 2013-08-06 NOTE — Progress Notes (Signed)
HYPOGLYCEMIC NOTE:  CBG 61, administered OJ and re-check CBG was 88.  Pt was symptomatic, appeared less alert and groggy.  Pt now back to baseline.    [name]

## 2013-08-06 NOTE — Progress Notes (Signed)
Admit: 08/04/2013 LOS: 2  81M new ESRD on IHD from diabetic kidney disease  Subjective:  HD #2 yesterday, uneventful Nausea somewhat improved Pt w/o co including no CP or SOB Using AVF w/o difficulty on Tx #2  03/07 0701 - 03/08 0700 In: 2355 [P.O.:480; I.V.:1875] Out: 1450 [Urine:450]  Filed Weights   08/05/13 1240 08/05/13 1540 08/06/13 0627  Weight: 118.8 kg (261 lb 14.5 oz) 118.3 kg (260 lb 12.9 oz) 118.9 kg (262 lb 2 oz)    Current meds: reviewed  Current Labs: reviewed    Physical Exam:  Blood pressure 140/82, pulse 99, temperature 98 F (36.7 C), temperature source Oral, resp. rate 20, weight 118.9 kg (262 lb 2 oz), SpO2 95.00%. NAD RRR, no rub CTAb, nl wob LUA AVF +B/T No LEE No rashes/lesions Nonfocal, aaox3 NCAT EOMI  Assessment 1. ESRD, new start on IHD, clipped 2. 2HPTH 3. Anemia, stable 4. DM2 5. HTN, stable 6. Hx/o NHL  Plan 1. HD #3 yesterday, then on MWF schedule via AVF; tight heparin 2. Await outpt chair 3. Check Phos and PTH with HD tomorrow  Pearson Grippe MD 08/06/2013, 8:10 AM   Recent Labs Lab 08/04/13 0200 08/05/13 0342 08/06/13 0440  NA 128* 137 136*  K 3.5* 2.8* 3.1*  CL 74* 95* 95*  CO2 31 25 26   GLUCOSE 618* 194* 82  BUN 132* 73* 47*  CREATININE 8.63* 6.21* 5.23*  CALCIUM 9.5 8.3* 9.1    Recent Labs Lab 08/04/13 0200 08/05/13 0342 08/06/13 0440  WBC 5.5 5.3 4.7  NEUTROABS 4.0  --   --   HGB 12.6* 11.3* 11.7*  HCT 35.3* 33.7* 34.6*  MCV 76.2* 78.0 79.2  PLT 259 233 230

## 2013-08-06 NOTE — Progress Notes (Signed)
Subjective: Did better with HD #2 yesterday, none planned for today, #3 tomorrow  Objective: Vital signs in last 24 hours: Temp:  [97.6 F (36.4 C)-98 F (36.7 C)] 98 F (36.7 C) (03/08 0627) Pulse Rate:  [87-99] 99 (03/08 0627) Resp:  [20-24] 20 (03/08 0627) BP: (121-149)/(66-87) 140/82 mmHg (03/08 0700) SpO2:  [95 %-100 %] 95 % (03/08 0627) Weight:  [118.3 kg (260 lb 12.9 oz)-118.9 kg (262 lb 2 oz)] 118.9 kg (262 lb 2 oz) (03/08 0627) Weight change: -0.6 kg (-1 lb 5.2 oz) Last BM Date: 08/05/13  Intake/Output from previous day: 03/07 0701 - 03/08 0700 In: 2355 [P.O.:480; I.V.:1875] Out: 1450 [Urine:450] Intake/Output this shift:   General appearance: alert, cooperative and slowed mentation  Head: Normocephalic, without obvious abnormality, atraumatic  Throat: lips, mucosa, and tongue normal; teeth and gums normal  Resp: clear to auscultation bilaterally  Chest wall: no tenderness  Cardio: regular rate and rhythm, S1, S2 normal, no murmur, click, rub or gallop  GI: soft, non-tender; bowel sounds normal; no masses, no organomegaly  Extremities: edema 1+ and lue AVF dressed.  Skin: Skin color, texture, turgor normal. No rashes or lesions  Neurologic: Grossly normal  Lab Results:  Recent Labs  08/05/13 0342 08/06/13 0440  WBC 5.3 4.7  HGB 11.3* 11.7*  HCT 33.7* 34.6*  PLT 233 230   BMET  Recent Labs  08/05/13 0342 08/06/13 0440  NA 137 136*  K 2.8* 3.1*  CL 95* 95*  CO2 25 26  GLUCOSE 194* 82  BUN 73* 47*  CREATININE 6.21* 5.23*  CALCIUM 8.3* 9.1    Studies/Results: No results found.  Medications:  I have reviewed the patient's current medications. Scheduled: . amLODipine  5 mg Oral Daily  . aspirin EC  81 mg Oral Daily  . calcitRIOL  0.25 mcg Oral Daily  . carvedilol  25 mg Oral BID WC  . insulin aspart  0-15 Units Subcutaneous TID WC  . insulin aspart  10 Units Subcutaneous TID WC  . insulin glargine  60 Units Subcutaneous QHS  . iron  polysaccharides  150 mg Oral Daily  . [START ON 08/07/2013] warfarin  10 mg Oral QPM  . Warfarin - Physician Dosing Inpatient   Does not apply q1800   Continuous: . sodium chloride 75 mL/hr at 08/05/13 0600   [id number], acetaminophen, HYDROcodone-acetaminophen, ondansetron (ZOFRAN) IV, ondansetron, polyethylene glycol  Assessment/Plan: Uremic Syndrome: HD per Renal with plans for [facility] post discharge. #3 tomorrow and then likely discharge home post HD with M/W/F schedule Hypokalemia: No adjustment with PO's, will need Renal to adjust HD.  DM2 with retinopathy: Poor historic control, SSI, compliance CBG 82 this AM. HTN: Stable  NHL: In remission   LOS: 2 days   [name] W 08/06/2013, 8:44 AM

## 2013-08-07 DIAGNOSIS — E1129 Type 2 diabetes mellitus with other diabetic kidney complication: Secondary | ICD-10-CM | POA: Diagnosis not present

## 2013-08-07 DIAGNOSIS — I10 Essential (primary) hypertension: Secondary | ICD-10-CM | POA: Diagnosis not present

## 2013-08-07 DIAGNOSIS — R7309 Other abnormal glucose: Secondary | ICD-10-CM | POA: Diagnosis not present

## 2013-08-07 DIAGNOSIS — N186 End stage renal disease: Secondary | ICD-10-CM | POA: Diagnosis not present

## 2013-08-07 DIAGNOSIS — N19 Unspecified kidney failure: Secondary | ICD-10-CM | POA: Diagnosis not present

## 2013-08-07 DIAGNOSIS — I82409 Acute embolism and thrombosis of unspecified deep veins of unspecified lower extremity: Secondary | ICD-10-CM | POA: Diagnosis not present

## 2013-08-07 LAB — CBC
HCT: 35.2 % — ABNORMAL LOW (ref 39.0–52.0)
Hemoglobin: 12 g/dL — ABNORMAL LOW (ref 13.0–17.0)
MCH: 26.5 pg (ref 26.0–34.0)
MCHC: 34.1 g/dL (ref 30.0–36.0)
MCV: 77.9 fL — ABNORMAL LOW (ref 78.0–100.0)
Platelets: 235 10*3/uL (ref 150–400)
RBC: 4.52 MIL/uL (ref 4.22–5.81)
RDW: 14.3 % (ref 11.5–15.5)
WBC: 5.7 10*3/uL (ref 4.0–10.5)

## 2013-08-07 LAB — BASIC METABOLIC PANEL
BUN: 51 mg/dL — ABNORMAL HIGH (ref 6–23)
CO2: 24 mEq/L (ref 19–32)
Calcium: 9.2 mg/dL (ref 8.4–10.5)
Chloride: 97 mEq/L (ref 96–112)
Creatinine, Ser: 5.31 mg/dL — ABNORMAL HIGH (ref 0.50–1.35)
GFR calc Af Amer: 12 mL/min — ABNORMAL LOW (ref >90)
GFR calc non Af Amer: 11 mL/min — ABNORMAL LOW (ref >90)
Glucose, Bld: 218 mg/dL — ABNORMAL HIGH (ref 70–99)
Potassium: 3.6 mEq/L — ABNORMAL LOW (ref 3.7–5.3)
Sodium: 137 mEq/L (ref 137–147)

## 2013-08-07 LAB — PHOSPHORUS: Phosphorus: 4.7 mg/dL — ABNORMAL HIGH (ref 2.3–4.6)

## 2013-08-07 LAB — GLUCOSE, CAPILLARY
Glucose-Capillary: 68 mg/dL — ABNORMAL LOW (ref 70–99)
Glucose-Capillary: 140 mg/dL — ABNORMAL HIGH (ref 70–99)
Glucose-Capillary: 62 mg/dL — ABNORMAL LOW (ref 70–99)
Glucose-Capillary: 242 mg/dL — ABNORMAL HIGH (ref 70–99)
Glucose-Capillary: 117 mg/dL — ABNORMAL HIGH (ref 70–99)
Glucose-Capillary: 272 mg/dL — ABNORMAL HIGH (ref 70–99)
Glucose-Capillary: 170 mg/dL — ABNORMAL HIGH (ref 70–99)

## 2013-08-07 LAB — PROTIME-INR
INR: 2.94 — ABNORMAL HIGH (ref 0.00–1.49)
Prothrombin Time: 29.6 seconds — ABNORMAL HIGH (ref 11.6–15.2)

## 2013-08-07 MED ORDER — AMLODIPINE BESYLATE 5 MG PO TABS: 5.0000 mg | ORAL_TABLET | Freq: Every day | ORAL | Status: DC

## 2013-08-07 MED ORDER — INSULIN GLARGINE 100 UNIT/ML ~~LOC~~ SOLN: 30.0000 [IU] | Freq: Every day | SUBCUTANEOUS | Status: DC

## 2013-08-07 MED ORDER — RENA-VITE PO TABS
1.0000 | ORAL_TABLET | Freq: Every day | ORAL | Status: DC
  Administered 2013-08-07: 1 via ORAL
  Filled 2013-08-07 (×2): qty 1

## 2013-08-07 MED ORDER — INSULIN GLARGINE 100 UNIT/ML ~~LOC~~ SOLN
30.0000 [IU] | Freq: Every day | SUBCUTANEOUS | Status: DC
  Administered 2013-08-07: 30 [IU] via SUBCUTANEOUS
  Filled 2013-08-07 (×2): qty 0.3

## 2013-08-07 NOTE — Discharge Summary (Signed)
DISCHARGE SUMMARY  [name]  MR#: [id number]  DOB:06-04-1950  Date of Admission: 08/04/2013 Date of Discharge: 08/07/2013  Attending Physician:Adan Baehr A  Patient's AY:[id number] A, MD  Consults:Treatment Team:  Estanislado Emms, MD  Discharge Diagnoses: Active Problems:   Hypertension   Non Hodgkin's lymphoma   Diabetic retinopathy   Diabetic nephropathy   DVT (deep venous thrombosis)   Proliferative diabetic retinopathy associated with type 2 diabetes mellitus   End stage renal disease   Hyperglycemia   Uremia   Discharge Medications:   Medication List    STOP taking these medications       furosemide 80 MG tablet  Commonly known as:  LASIX     KLOR-CON M20 20 MEQ tablet  Generic drug:  potassium chloride SA     promethazine 25 MG tablet  Commonly known as:  PHENERGAN      TAKE these medications       amLODipine 5 MG tablet  Commonly known as:  NORVASC  Take 1 tablet (5 mg total) by mouth daily.     calcitRIOL 0.25 MCG capsule  Commonly known as:  ROCALTROL  Take 0.25 mcg by mouth daily.     carvedilol 25 MG tablet  Commonly known as:  COREG  Take 25 mg by mouth 2 (two) times daily with a meal.     insulin glargine 100 UNIT/ML injection  Commonly known as:  LANTUS  Inject 0.3 mLs (30 Units total) into the skin at bedtime.     insulin lispro 100 UNIT/ML injection  Commonly known as:  HUMALOG  Inject 10 Units into the skin 2 (two) times daily.     polysaccharide iron 150 MG capsule  Generic drug:  iron polysaccharides  Take 1 capsule (150 mg total) by mouth daily.     warfarin 5 MG tablet  Commonly known as:  COUMADIN  Take 10 mg by mouth every evening.        Hospital Procedures: Dg Abd Acute W/chest  08/04/2013   CLINICAL DATA:  Epigastric pain.  Diarrhea.  EXAM: ACUTE ABDOMEN SERIES (ABDOMEN 2 VIEW & CHEST 1 VIEW)  COMPARISON:  06/09/2013 chest x-ray.  FINDINGS: Calcified aortic knob.  No infiltrate, congestive heart  failure or pneumothorax.  Central pulmonary vascular prominence stable.  Heart size within normal limits.  Nonspecific bowel gas pattern without plain film evidence of bowel obstruction or free intraperitoneal air.  Inferior vena cava filter is in place.  IMPRESSION: No plain film evidence of bowel obstruction or free intraperitoneal air. Please see above.   Electronically Signed   By: Chauncey Cruel [name]   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 The initial story was that he was vomiting however he reports no vomiting just generalized listlessness, dizziness and poor intake because of no appetite. He's had no increasing fluid accumulation or shortness of breath. He has had dialysis access placed several weeks ago in his left wrist. He's having no chest pain or shortness of breath. No fever chills or night sweats. He started markedly uremic and hyperglycemic and is admitted for further management. He has been taking his insulin  but has not been checking his blood sugars.     Hospital Course: Patient was admitted primarily due to advanced uremia and failure to thrive.  Blood sugars were adjusted and stabilized.  He was stable from a cardiac and hemodynamic standpoint.  He began urgent dialysis as was planned prior and improved dramatically.  He was dialysed Friday, Saturday and again today.  He will be discharged to outpatient dialysis.  Day of Discharge Exam BP 127/76  Pulse 98  Temp(Src) 97.5 F (36.4 C) (Oral)  Resp 18  Wt 119 kg (262 lb 5.6 oz)  SpO2 95%  Physical  Exam: General appearance: alert, cooperative and no distress Eyes: no scleral icterus Throat: oropharynx moist without erythema Resp: clear to auscultation bilaterally Cardio: regular rate and rhythm Extremities: no clubbing, cyanosis or edema  Discharge Labs:  Recent Labs  08/06/13 0440 08/07/13 0730  NA 136* 137  K 3.1* 3.6*  CL 95* 97  CO2 26 24  GLUCOSE 82 218*  BUN 47* 51*  CREATININE 5.23* 5.31*  CALCIUM 9.1 9.2  PHOS  --  4.7*   No results found for this basename: AST, ALT, ALKPHOS, BILITOT, PROT, ALBUMIN,  in the last 72 hours  Recent Labs  08/05/13 0342 08/06/13 0440  WBC 5.3 4.7  HGB 11.3* 11.7*  HCT 33.7* 34.6*  MCV 78.0 79.2  PLT 233 230   No results found for this basename: CKTOTAL, CKMB, CKMBINDEX, TROPONINI,  in the last 72 hours No results found for this basename: TSH, T4TOTAL, FREET3, T3FREE, THYROIDAB,  in the last 72 hours No results found for this basename: VITAMINB12, FOLATE, FERRITIN, TIBC, IRON, RETICCTPCT,  in the last 72 hours  Discharge instructions:     Discharge Orders   Future Appointments Provider Department Dept Phone   11/17/2013 9:00 AM Hayden Pedro, MD [place] [phone]   Future Orders Complete By Expires   Diet - low sodium heart healthy  As directed    Discharge instructions  As directed    Comments:     dont miss three times weekly dialysis set up by dialysis team   Increase activity slowly  As directed       Disposition: home  Follow-up Appts: Dialysis and renal team as they indicate. He will follow up with Dr.Izyk Marty for diabetic management as scheduled. Condition on Discharge: improved  Tests Needing Follow-up: Per renal team  Signed: Ania Levay A 08/07/2013, 8:37 AM

## 2013-08-07 NOTE — Progress Notes (Signed)
S:Pt seen on HD. Tolerated well  Feels well O:BP 127/76  Pulse 98  Temp(Src) 97.5 F (36.4 C) (Oral)  Resp 18  Wt 119 kg (262 lb 5.6 oz)  SpO2 95%  Intake/Output Summary (Last 24 hours) at 08/07/13 0836 Last data filed at 08/07/13 0620  Gross per 24 hour  Intake   1665 ml  Output    875 ml  Net    790 ml   Weight change: 0.2 kg (7.1 oz) [id number] and alert CVS:RRR Resp:Clear Abd:+ BS NTND Ext:No edema  Lt AVF + bruit NEURO:CNI Ox3 no asterixis   . amLODipine  5 mg Oral Daily  . aspirin EC  81 mg Oral Daily  . calcitRIOL  0.25 mcg Oral Daily  . carvedilol  25 mg Oral BID WC  . dextrose      . insulin aspart  0-15 Units Subcutaneous TID WC  . insulin aspart  10 Units Subcutaneous TID WC  . insulin glargine  60 Units Subcutaneous QHS  . iron polysaccharides  150 mg Oral Daily  . warfarin  10 mg Oral QPM  . Warfarin - Physician Dosing Inpatient   Does not apply q1800   No results found. BMET    Component Value Date/Time   NA 137 08/07/2013 0730   K 3.6* 08/07/2013 0730   CL 97 08/07/2013 0730   CO2 24 08/07/2013 0730   GLUCOSE 218* 08/07/2013 0730   BUN 51* 08/07/2013 0730   CREATININE 5.31* 08/07/2013 0730   CREATININE 4.44* 01/14/2011 1428   CALCIUM 9.2 08/07/2013 0730   CALCIUM 9.1 01/09/2013 0903   GFRNONAA 11* 08/07/2013 0730   GFRAA 12* 08/07/2013 0730   CBC    Component Value Date/Time   WBC 4.7 08/06/2013 0440   WBC 5.2 09/29/2011 1230   RBC 4.37 08/06/2013 0440   RBC 3.75* 12/28/2012 0524   RBC 3.49* 09/29/2011 1230   HGB 11.7* 08/06/2013 0440   HGB 9.4* 09/29/2011 1230   HCT 34.6* 08/06/2013 0440   HCT 28.9* 09/29/2011 1230   PLT 230 08/06/2013 0440   PLT 230 09/29/2011 1230   MCV 79.2 08/06/2013 0440   MCV 82.6 09/29/2011 1230   MCH 26.8 08/06/2013 0440   MCH 27.0* 09/29/2011 1230   MCHC 33.8 08/06/2013 0440   MCHC 32.6 09/29/2011 1230   RDW 14.3 08/06/2013 0440   RDW 17.2* 09/29/2011 1230   LYMPHSABS 1.1 08/04/2013 0200   LYMPHSABS 1.2 09/29/2011 1230   MONOABS 0.3 08/04/2013 0200   MONOABS 0.5 09/29/2011 1230   EOSABS 0.0 08/04/2013 0200   EOSABS 0.2 09/29/2011 1230   BASOSABS 0.0 08/04/2013 0200   BASOSABS 0.0 09/29/2011 1230     Assessment: 1. ESRD, now on HD 2. Sec PTH, on calcitriol but will switch to IV hectorol when get PTH level back 3. DM 4. HTN 5. Mild anemia Plan: 1. Awaiting outpt spot.  He cannot be DC until I have a spot for him. 2. Await PTH level   Marrie Chandra T

## 2013-08-07 NOTE — Progress Notes (Signed)
CSW spoke with pt and his son re: pt's discharge plans.  Per pt, he will not have 24 hr care at home and now wants to consider SNF for rehab.  CSW to begin bed search in [place] per pt's request.

## 2013-08-07 NOTE — Progress Notes (Addendum)
Ambulated with patient in hallway, using rolling walker, complaint being unsteady and gait wobbly. Pt has been tolerating oob in room, states this is his first ambulation in hallway.  Bp on return to the room 137/78 . Pt voiced concern regarding being at home alone, pt states his son works and will be going out on the truck tonight. Will notify Dr. Reynaldo Minium with patient concerns Joylene Draft A

## 2013-08-07 NOTE — Progress Notes (Signed)
08/07/2013 3:48 PM Hemodialysis Outpatient Note: This patient has been accepted at the [facility] on a Tuesday,Thursday and Saturday 2nd shift. The center can begin treatment on Thursday March 12th at 1130 AM.  Thank you.

## 2013-08-07 NOTE — Progress Notes (Signed)
HYPOGLYCEMIC NOTE:  CBG 68, administered juice and re-check 62.  Gave [name] a meal from our fridge.  [name]'s CBG now 140.  Notified Dr. Reynaldo Minium and received instructions to hold this evening's dose of Lantus 60U.  [name] felt symptomatic right before the event.  Will continue to monitor closely.  [name]

## 2013-08-07 NOTE — Progress Notes (Signed)
Clinical Social Work Department BRIEF PSYCHOSOCIAL ASSESSMENT 08/07/2013  Patient:  [name], [name]     Account Number:  [id number]     Admit date:  08/04/2013  Clinical Social Worker:  Megan Salon  Date/Time:  08/07/2013 02:26 PM  Referred by:  RN  Date Referred:  08/05/2013 Referred for  Transportation assistance   Other Referral:   Interview type:  Patient Other interview type:    PSYCHOSOCIAL DATA Living Status:  FAMILY Admitted from facility:   Level of care:   Primary support name:  [name] Primary support relationship to patient:  CHILD, ADULT Degree of support available:   Okay    CURRENT CONCERNS Current Concerns  Other - See comment   Other Concerns:   Transportation to dialysis    SOCIAL WORK ASSESSMENT / PLAN CSW received referral that patient needs assistance with transportation to dialysis. CSW went into room and introduced self to patient and explained reason for visit. Patient stated that his family works during the day and is not able to transport, but states once MD lets him drive, he can drive himself. CSW provided resources for [facility] and other transportation services. CSW signing off at this time.   Assessment/plan status:  Information/Referral to [employer] Other assessment/ plan:   Information/referral to community resources:   Architectural technologist    PATIENT'S/FAMILY'S RESPONSE TO PLAN OF CARE: Patient states he is able to have someone help him get to dialysis Wednesday, but would need assistance the other times.        Jeanette Caprice, MSW, [place]

## 2013-08-08 DIAGNOSIS — L97409 Non-pressure chronic ulcer of unspecified heel and midfoot with unspecified severity: Secondary | ICD-10-CM | POA: Diagnosis not present

## 2013-08-08 DIAGNOSIS — D631 Anemia in chronic kidney disease: Secondary | ICD-10-CM | POA: Diagnosis not present

## 2013-08-08 DIAGNOSIS — N19 Unspecified kidney failure: Secondary | ICD-10-CM | POA: Diagnosis not present

## 2013-08-08 DIAGNOSIS — Z23 Encounter for immunization: Secondary | ICD-10-CM | POA: Diagnosis not present

## 2013-08-08 DIAGNOSIS — Z8739 Personal history of other diseases of the musculoskeletal system and connective tissue: Secondary | ICD-10-CM | POA: Insufficient documentation

## 2013-08-08 DIAGNOSIS — I10 Essential (primary) hypertension: Secondary | ICD-10-CM | POA: Diagnosis not present

## 2013-08-08 DIAGNOSIS — E11319 Type 2 diabetes mellitus with unspecified diabetic retinopathy without macular edema: Secondary | ICD-10-CM | POA: Diagnosis not present

## 2013-08-08 DIAGNOSIS — N186 End stage renal disease: Secondary | ICD-10-CM | POA: Diagnosis not present

## 2013-08-08 DIAGNOSIS — M6281 Muscle weakness (generalized): Secondary | ICD-10-CM | POA: Diagnosis not present

## 2013-08-08 DIAGNOSIS — S91309A Unspecified open wound, unspecified foot, initial encounter: Secondary | ICD-10-CM | POA: Diagnosis not present

## 2013-08-08 DIAGNOSIS — E1129 Type 2 diabetes mellitus with other diabetic kidney complication: Secondary | ICD-10-CM | POA: Diagnosis not present

## 2013-08-08 DIAGNOSIS — R269 Unspecified abnormalities of gait and mobility: Secondary | ICD-10-CM | POA: Diagnosis not present

## 2013-08-08 DIAGNOSIS — N2581 Secondary hyperparathyroidism of renal origin: Secondary | ICD-10-CM | POA: Diagnosis not present

## 2013-08-08 DIAGNOSIS — R799 Abnormal finding of blood chemistry, unspecified: Secondary | ICD-10-CM | POA: Diagnosis not present

## 2013-08-08 DIAGNOSIS — D689 Coagulation defect, unspecified: Secondary | ICD-10-CM | POA: Diagnosis not present

## 2013-08-08 DIAGNOSIS — R7309 Other abnormal glucose: Secondary | ICD-10-CM | POA: Diagnosis not present

## 2013-08-08 DIAGNOSIS — N058 Unspecified nephritic syndrome with other morphologic changes: Secondary | ICD-10-CM | POA: Diagnosis not present

## 2013-08-08 DIAGNOSIS — I82409 Acute embolism and thrombosis of unspecified deep veins of unspecified lower extremity: Secondary | ICD-10-CM | POA: Diagnosis not present

## 2013-08-08 DIAGNOSIS — E1139 Type 2 diabetes mellitus with other diabetic ophthalmic complication: Secondary | ICD-10-CM | POA: Diagnosis not present

## 2013-08-08 DIAGNOSIS — C8589 Other specified types of non-Hodgkin lymphoma, extranodal and solid organ sites: Secondary | ICD-10-CM | POA: Diagnosis not present

## 2013-08-08 DIAGNOSIS — E1142 Type 2 diabetes mellitus with diabetic polyneuropathy: Secondary | ICD-10-CM | POA: Diagnosis not present

## 2013-08-08 DIAGNOSIS — L97509 Non-pressure chronic ulcer of other part of unspecified foot with unspecified severity: Secondary | ICD-10-CM | POA: Diagnosis not present

## 2013-08-08 DIAGNOSIS — G4739 Other sleep apnea: Secondary | ICD-10-CM | POA: Insufficient documentation

## 2013-08-08 DIAGNOSIS — E119 Type 2 diabetes mellitus without complications: Secondary | ICD-10-CM | POA: Diagnosis not present

## 2013-08-08 DIAGNOSIS — N039 Chronic nephritic syndrome with unspecified morphologic changes: Secondary | ICD-10-CM | POA: Diagnosis not present

## 2013-08-08 LAB — CBC
HCT: 34.7 % — ABNORMAL LOW (ref 39.0–52.0)
Hemoglobin: 11.5 g/dL — ABNORMAL LOW (ref 13.0–17.0)
MCH: 26.2 pg (ref 26.0–34.0)
MCHC: 33.1 g/dL (ref 30.0–36.0)
MCV: 79 fL (ref 78.0–100.0)
Platelets: 215 10*3/uL (ref 150–400)
RBC: 4.39 MIL/uL (ref 4.22–5.81)
RDW: 14.3 % (ref 11.5–15.5)
WBC: 6.1 10*3/uL (ref 4.0–10.5)

## 2013-08-08 LAB — BASIC METABOLIC PANEL
BUN: 36 mg/dL — ABNORMAL HIGH (ref 6–23)
CO2: 24 mEq/L (ref 19–32)
Calcium: 9.1 mg/dL (ref 8.4–10.5)
Chloride: 92 mEq/L — ABNORMAL LOW (ref 96–112)
Creatinine, Ser: 5 mg/dL — ABNORMAL HIGH (ref 0.50–1.35)
GFR calc Af Amer: 13 mL/min — ABNORMAL LOW (ref >90)
GFR calc non Af Amer: 11 mL/min — ABNORMAL LOW (ref >90)
Glucose, Bld: 148 mg/dL — ABNORMAL HIGH (ref 70–99)
Potassium: 3.9 mEq/L (ref 3.7–5.3)
Sodium: 132 mEq/L — ABNORMAL LOW (ref 137–147)

## 2013-08-08 LAB — GLUCOSE, CAPILLARY
Glucose-Capillary: 160 mg/dL — ABNORMAL HIGH (ref 70–99)
Glucose-Capillary: 190 mg/dL — ABNORMAL HIGH (ref 70–99)

## 2013-08-08 LAB — PARATHYROID HORMONE, INTACT (NO CA): PTH: 272.7 pg/mL — ABNORMAL HIGH (ref 14.0–72.0)

## 2013-08-08 LAB — PROTIME-INR
INR: 2.43 — ABNORMAL HIGH (ref 0.00–1.49)
Prothrombin Time: 25.6 seconds — ABNORMAL HIGH (ref 11.6–15.2)

## 2013-08-08 MED ORDER — RENA-VITE PO TABS: 1.0000 | ORAL_TABLET | Freq: Every day | ORAL | Status: DC

## 2013-08-08 MED ORDER — INSULIN GLARGINE 100 UNIT/ML ~~LOC~~ SOLN: 30.0000 [IU] | Freq: Every day | SUBCUTANEOUS | Status: DC

## 2013-08-08 NOTE — Progress Notes (Signed)
CSW spoke to patient about bed offer. At this time, the only bed offer is [facility] in [place]. Patient is agreeable to this placement and states that his son will take him there today. Per MD, patient does not need a PT consult for SNF placement. [facility] snf states they are able to take patient today. CSW provided dc packet to patient and is signing off at this time.  Jeanette Caprice, MSW, [place]

## 2013-08-08 NOTE — Progress Notes (Signed)
Pt given d/c instructions at this time; IV and tele monitor d/c; pt to d/c to SNF and be transported by son; son at bedside; will cont. To monitor.

## 2013-08-08 NOTE — Progress Notes (Signed)
S: Pt seen on HD. Tolerated well  Feels well O:BP 150/71  Pulse 86  Temp(Src) 97.7 F (36.5 C) (Oral)  Resp 18  Wt 120.748 kg (266 lb 3.2 oz)  SpO2 100%  Intake/Output Summary (Last 24 hours) at 08/08/13 0802 Last data filed at 08/07/13 2000  Gross per 24 hour  Intake    480 ml  Output   3016 ml  Net  -2536 ml   Weight change: 3.3 kg (7 lb 4.4 oz) [id number] and alert CVS:RRR Resp:Clear Abd:+ BS NTND Ext:No edema  Lt AVF + bruit NEURO:CNI Ox3 no asterixis   . amLODipine  5 mg Oral Daily  . aspirin EC  81 mg Oral Daily  . calcitRIOL  0.25 mcg Oral Daily  . carvedilol  25 mg Oral BID WC  . insulin aspart  0-15 Units Subcutaneous TID WC  . insulin aspart  10 Units Subcutaneous TID WC  . insulin glargine  30 Units Subcutaneous QHS  . iron polysaccharides  150 mg Oral Daily  . multivitamin  1 tablet Oral QHS  . warfarin  10 mg Oral QPM  . Warfarin - Physician Dosing Inpatient   Does not apply q1800   No results found. BMET    Component Value Date/Time   NA 132* 08/08/2013 0338   K 3.9 08/08/2013 0338   CL 92* 08/08/2013 0338   CO2 24 08/08/2013 0338   GLUCOSE 148* 08/08/2013 0338   BUN 36* 08/08/2013 0338   CREATININE 5.00* 08/08/2013 0338   CREATININE 4.44* 01/14/2011 1428   CALCIUM 9.1 08/08/2013 0338   CALCIUM 9.1 01/09/2013 0903   GFRNONAA 11* 08/08/2013 0338   GFRAA 13* 08/08/2013 0338   CBC    Component Value Date/Time   WBC 6.1 08/08/2013 0338   WBC 5.2 09/29/2011 1230   RBC 4.39 08/08/2013 0338   RBC 3.75* 12/28/2012 0524   RBC 3.49* 09/29/2011 1230   HGB 11.5* 08/08/2013 0338   HGB 9.4* 09/29/2011 1230   HCT 34.7* 08/08/2013 0338   HCT 28.9* 09/29/2011 1230   PLT 215 08/08/2013 0338   PLT 230 09/29/2011 1230   MCV 79.0 08/08/2013 0338   MCV 82.6 09/29/2011 1230   MCH 26.2 08/08/2013 0338   MCH 27.0* 09/29/2011 1230   MCHC 33.1 08/08/2013 0338   MCHC 32.6 09/29/2011 1230   RDW 14.3 08/08/2013 0338   RDW 17.2* 09/29/2011 1230   LYMPHSABS 1.1 08/04/2013 0200   LYMPHSABS  1.2 09/29/2011 1230   MONOABS 0.3 08/04/2013 0200   MONOABS 0.5 09/29/2011 1230   EOSABS 0.0 08/04/2013 0200   EOSABS 0.2 09/29/2011 1230   BASOSABS 0.0 08/04/2013 0200   BASOSABS 0.0 09/29/2011 1230     Assessment: 1. ESRD, now on HD 2. Sec PTH 3. DM 4. HTN 5. Mild anemia Plan: 1. Has outpt spot at [facility] 2nd shift 2. Await PTH level.  DC calcitriol 3. Plan HD again on thurs  [name] T

## 2013-08-08 NOTE — Progress Notes (Signed)
Clinical Social Work Department CLINICAL SOCIAL WORK PLACEMENT NOTE 08/08/2013  Patient:  [name], [name]  Account Number:  [id number] [place] date:  08/04/2013  Clinical Social Worker:  Megan Salon  Date/time:  08/08/2013 10:47 AM  Clinical Social Work is seeking post-discharge placement for this patient at the following level of care:   [facility]   (*CSW will update this form in Epic as items are completed)   08/08/2013  Patient/family provided with [place] Department of Clinical Social Work's list of facilities offering this level of care within the geographic area requested by the patient (or if unable, by the patient's family).  08/08/2013  Patient/family informed of their freedom to choose among providers that offer the needed level of care, that participate in Medicare, Medicaid or managed care program needed by the patient, have an available bed and are willing to accept the patient.  08/08/2013  Patient/family informed of [facility]' ownership interest in [facility], as well as of the fact that they are under no obligation to receive care at this facility.  PASARR submitted to EDS on 08/07/2013 PASARR number received from EDS on 08/07/2013  FL2 transmitted to all facilities in geographic area requested by pt/family on  08/07/2013 FL2 transmitted to all facilities within larger geographic area on   Patient informed that his/her managed care company has contracts with or will negotiate with  certain facilities, including the following:     Patient/family informed of bed offers received:  08/08/2013 Patient chooses bed at [facility] Physician recommends and patient chooses bed at    Patient to be transferred to [facility] on  08/08/2013 Patient to be transferred to facility by FAMILY  The following physician request were entered in Epic:   Additional Comments:  Jeanette Caprice, MSW, [place]

## 2013-08-08 NOTE — Progress Notes (Signed)
Patient ambulated in hall 550 ft with front wheel walker and stand- by assist from RN. Patient tolerated well.

## 2013-08-08 NOTE — Progress Notes (Signed)
See yesterdays note, discharge held for snf--ready today.  No changes since discharge summary 08/07/13.

## 2013-08-09 DIAGNOSIS — S91309A Unspecified open wound, unspecified foot, initial encounter: Secondary | ICD-10-CM | POA: Diagnosis not present

## 2013-08-09 DIAGNOSIS — L97409 Non-pressure chronic ulcer of unspecified heel and midfoot with unspecified severity: Secondary | ICD-10-CM | POA: Diagnosis not present

## 2013-08-10 ENCOUNTER — Encounter (HOSPITAL_COMMUNITY): Payer: Medicare Other

## 2013-08-10 DIAGNOSIS — N2581 Secondary hyperparathyroidism of renal origin: Secondary | ICD-10-CM | POA: Diagnosis not present

## 2013-08-10 DIAGNOSIS — D631 Anemia in chronic kidney disease: Secondary | ICD-10-CM | POA: Diagnosis not present

## 2013-08-10 DIAGNOSIS — N186 End stage renal disease: Secondary | ICD-10-CM | POA: Diagnosis not present

## 2013-08-10 DIAGNOSIS — Z23 Encounter for immunization: Secondary | ICD-10-CM | POA: Diagnosis not present

## 2013-08-12 DIAGNOSIS — Z23 Encounter for immunization: Secondary | ICD-10-CM | POA: Diagnosis not present

## 2013-08-12 DIAGNOSIS — N186 End stage renal disease: Secondary | ICD-10-CM | POA: Diagnosis not present

## 2013-08-12 DIAGNOSIS — D631 Anemia in chronic kidney disease: Secondary | ICD-10-CM | POA: Diagnosis not present

## 2013-08-12 DIAGNOSIS — N039 Chronic nephritic syndrome with unspecified morphologic changes: Secondary | ICD-10-CM | POA: Diagnosis not present

## 2013-08-12 DIAGNOSIS — N2581 Secondary hyperparathyroidism of renal origin: Secondary | ICD-10-CM | POA: Diagnosis not present

## 2013-08-13 DIAGNOSIS — E11319 Type 2 diabetes mellitus with unspecified diabetic retinopathy without macular edema: Secondary | ICD-10-CM | POA: Diagnosis not present

## 2013-08-13 DIAGNOSIS — R269 Unspecified abnormalities of gait and mobility: Secondary | ICD-10-CM | POA: Diagnosis not present

## 2013-08-13 DIAGNOSIS — I10 Essential (primary) hypertension: Secondary | ICD-10-CM | POA: Diagnosis not present

## 2013-08-13 DIAGNOSIS — L97509 Non-pressure chronic ulcer of other part of unspecified foot with unspecified severity: Secondary | ICD-10-CM | POA: Diagnosis not present

## 2013-08-13 DIAGNOSIS — C8589 Other specified types of non-Hodgkin lymphoma, extranodal and solid organ sites: Secondary | ICD-10-CM | POA: Diagnosis not present

## 2013-08-13 DIAGNOSIS — N058 Unspecified nephritic syndrome with other morphologic changes: Secondary | ICD-10-CM | POA: Diagnosis not present

## 2013-08-13 DIAGNOSIS — E119 Type 2 diabetes mellitus without complications: Secondary | ICD-10-CM | POA: Diagnosis not present

## 2013-08-13 DIAGNOSIS — I82409 Acute embolism and thrombosis of unspecified deep veins of unspecified lower extremity: Secondary | ICD-10-CM | POA: Diagnosis not present

## 2013-08-14 DIAGNOSIS — R799 Abnormal finding of blood chemistry, unspecified: Secondary | ICD-10-CM | POA: Diagnosis not present

## 2013-08-14 DIAGNOSIS — N186 End stage renal disease: Secondary | ICD-10-CM | POA: Diagnosis not present

## 2013-08-14 DIAGNOSIS — I82409 Acute embolism and thrombosis of unspecified deep veins of unspecified lower extremity: Secondary | ICD-10-CM | POA: Diagnosis not present

## 2013-08-14 DIAGNOSIS — R269 Unspecified abnormalities of gait and mobility: Secondary | ICD-10-CM | POA: Diagnosis not present

## 2013-08-14 DIAGNOSIS — D689 Coagulation defect, unspecified: Secondary | ICD-10-CM | POA: Diagnosis not present

## 2013-08-14 DIAGNOSIS — I10 Essential (primary) hypertension: Secondary | ICD-10-CM | POA: Diagnosis not present

## 2013-08-14 DIAGNOSIS — E119 Type 2 diabetes mellitus without complications: Secondary | ICD-10-CM | POA: Diagnosis not present

## 2013-08-15 DIAGNOSIS — N2581 Secondary hyperparathyroidism of renal origin: Secondary | ICD-10-CM | POA: Insufficient documentation

## 2013-08-15 DIAGNOSIS — I10 Essential (primary) hypertension: Secondary | ICD-10-CM | POA: Diagnosis not present

## 2013-08-15 DIAGNOSIS — R269 Unspecified abnormalities of gait and mobility: Secondary | ICD-10-CM | POA: Diagnosis not present

## 2013-08-15 DIAGNOSIS — I82409 Acute embolism and thrombosis of unspecified deep veins of unspecified lower extremity: Secondary | ICD-10-CM | POA: Diagnosis not present

## 2013-08-15 DIAGNOSIS — Z23 Encounter for immunization: Secondary | ICD-10-CM | POA: Diagnosis not present

## 2013-08-15 DIAGNOSIS — E119 Type 2 diabetes mellitus without complications: Secondary | ICD-10-CM | POA: Diagnosis not present

## 2013-08-15 DIAGNOSIS — R799 Abnormal finding of blood chemistry, unspecified: Secondary | ICD-10-CM | POA: Diagnosis not present

## 2013-08-15 DIAGNOSIS — N039 Chronic nephritic syndrome with unspecified morphologic changes: Secondary | ICD-10-CM | POA: Diagnosis not present

## 2013-08-15 DIAGNOSIS — D689 Coagulation defect, unspecified: Secondary | ICD-10-CM | POA: Diagnosis not present

## 2013-08-15 DIAGNOSIS — D631 Anemia in chronic kidney disease: Secondary | ICD-10-CM | POA: Diagnosis not present

## 2013-08-15 DIAGNOSIS — N186 End stage renal disease: Secondary | ICD-10-CM | POA: Diagnosis not present

## 2013-08-16 DIAGNOSIS — L97409 Non-pressure chronic ulcer of unspecified heel and midfoot with unspecified severity: Secondary | ICD-10-CM | POA: Diagnosis not present

## 2013-08-17 ENCOUNTER — Encounter (HOSPITAL_COMMUNITY): Payer: Medicare Other

## 2013-08-17 DIAGNOSIS — I10 Essential (primary) hypertension: Secondary | ICD-10-CM | POA: Diagnosis not present

## 2013-08-17 DIAGNOSIS — R269 Unspecified abnormalities of gait and mobility: Secondary | ICD-10-CM | POA: Diagnosis not present

## 2013-08-17 DIAGNOSIS — R799 Abnormal finding of blood chemistry, unspecified: Secondary | ICD-10-CM | POA: Diagnosis not present

## 2013-08-17 DIAGNOSIS — I82409 Acute embolism and thrombosis of unspecified deep veins of unspecified lower extremity: Secondary | ICD-10-CM | POA: Diagnosis not present

## 2013-08-17 DIAGNOSIS — D631 Anemia in chronic kidney disease: Secondary | ICD-10-CM | POA: Diagnosis not present

## 2013-08-17 DIAGNOSIS — D689 Coagulation defect, unspecified: Secondary | ICD-10-CM | POA: Diagnosis not present

## 2013-08-17 DIAGNOSIS — E119 Type 2 diabetes mellitus without complications: Secondary | ICD-10-CM | POA: Diagnosis not present

## 2013-08-17 DIAGNOSIS — N186 End stage renal disease: Secondary | ICD-10-CM | POA: Diagnosis not present

## 2013-08-17 DIAGNOSIS — Z23 Encounter for immunization: Secondary | ICD-10-CM | POA: Diagnosis not present

## 2013-08-17 DIAGNOSIS — N2581 Secondary hyperparathyroidism of renal origin: Secondary | ICD-10-CM | POA: Diagnosis not present

## 2013-08-19 DIAGNOSIS — Z23 Encounter for immunization: Secondary | ICD-10-CM | POA: Diagnosis not present

## 2013-08-19 DIAGNOSIS — N039 Chronic nephritic syndrome with unspecified morphologic changes: Secondary | ICD-10-CM | POA: Diagnosis not present

## 2013-08-19 DIAGNOSIS — N2581 Secondary hyperparathyroidism of renal origin: Secondary | ICD-10-CM | POA: Diagnosis not present

## 2013-08-19 DIAGNOSIS — D631 Anemia in chronic kidney disease: Secondary | ICD-10-CM | POA: Diagnosis not present

## 2013-08-19 DIAGNOSIS — N186 End stage renal disease: Secondary | ICD-10-CM | POA: Diagnosis not present

## 2013-08-22 DIAGNOSIS — N2581 Secondary hyperparathyroidism of renal origin: Secondary | ICD-10-CM | POA: Diagnosis not present

## 2013-08-22 DIAGNOSIS — D631 Anemia in chronic kidney disease: Secondary | ICD-10-CM | POA: Diagnosis not present

## 2013-08-22 DIAGNOSIS — Z23 Encounter for immunization: Secondary | ICD-10-CM | POA: Diagnosis not present

## 2013-08-22 DIAGNOSIS — I82409 Acute embolism and thrombosis of unspecified deep veins of unspecified lower extremity: Secondary | ICD-10-CM | POA: Diagnosis not present

## 2013-08-22 DIAGNOSIS — D689 Coagulation defect, unspecified: Secondary | ICD-10-CM | POA: Diagnosis not present

## 2013-08-22 DIAGNOSIS — N186 End stage renal disease: Secondary | ICD-10-CM | POA: Diagnosis not present

## 2013-08-23 DIAGNOSIS — S91309A Unspecified open wound, unspecified foot, initial encounter: Secondary | ICD-10-CM | POA: Diagnosis not present

## 2013-08-24 ENCOUNTER — Encounter (HOSPITAL_COMMUNITY): Payer: Medicare Other

## 2013-08-24 DIAGNOSIS — Z23 Encounter for immunization: Secondary | ICD-10-CM | POA: Diagnosis not present

## 2013-08-24 DIAGNOSIS — N2581 Secondary hyperparathyroidism of renal origin: Secondary | ICD-10-CM | POA: Diagnosis not present

## 2013-08-24 DIAGNOSIS — N039 Chronic nephritic syndrome with unspecified morphologic changes: Secondary | ICD-10-CM | POA: Diagnosis not present

## 2013-08-24 DIAGNOSIS — D631 Anemia in chronic kidney disease: Secondary | ICD-10-CM | POA: Diagnosis not present

## 2013-08-24 DIAGNOSIS — N186 End stage renal disease: Secondary | ICD-10-CM | POA: Diagnosis not present

## 2013-08-26 DIAGNOSIS — Z23 Encounter for immunization: Secondary | ICD-10-CM | POA: Diagnosis not present

## 2013-08-26 DIAGNOSIS — D631 Anemia in chronic kidney disease: Secondary | ICD-10-CM | POA: Diagnosis not present

## 2013-08-26 DIAGNOSIS — N2581 Secondary hyperparathyroidism of renal origin: Secondary | ICD-10-CM | POA: Diagnosis not present

## 2013-08-26 DIAGNOSIS — N186 End stage renal disease: Secondary | ICD-10-CM | POA: Diagnosis not present

## 2013-08-28 DIAGNOSIS — L97509 Non-pressure chronic ulcer of other part of unspecified foot with unspecified severity: Secondary | ICD-10-CM | POA: Diagnosis not present

## 2013-08-28 DIAGNOSIS — I82409 Acute embolism and thrombosis of unspecified deep veins of unspecified lower extremity: Secondary | ICD-10-CM | POA: Diagnosis not present

## 2013-08-28 DIAGNOSIS — I10 Essential (primary) hypertension: Secondary | ICD-10-CM | POA: Diagnosis not present

## 2013-08-28 DIAGNOSIS — E11319 Type 2 diabetes mellitus with unspecified diabetic retinopathy without macular edema: Secondary | ICD-10-CM | POA: Diagnosis not present

## 2013-08-28 DIAGNOSIS — E119 Type 2 diabetes mellitus without complications: Secondary | ICD-10-CM | POA: Diagnosis not present

## 2013-08-28 DIAGNOSIS — C8589 Other specified types of non-Hodgkin lymphoma, extranodal and solid organ sites: Secondary | ICD-10-CM | POA: Diagnosis not present

## 2013-08-28 DIAGNOSIS — R269 Unspecified abnormalities of gait and mobility: Secondary | ICD-10-CM | POA: Diagnosis not present

## 2013-08-28 DIAGNOSIS — N058 Unspecified nephritic syndrome with other morphologic changes: Secondary | ICD-10-CM | POA: Diagnosis not present

## 2013-08-29 DIAGNOSIS — N039 Chronic nephritic syndrome with unspecified morphologic changes: Secondary | ICD-10-CM | POA: Diagnosis not present

## 2013-08-29 DIAGNOSIS — N186 End stage renal disease: Secondary | ICD-10-CM | POA: Diagnosis not present

## 2013-08-29 DIAGNOSIS — D631 Anemia in chronic kidney disease: Secondary | ICD-10-CM | POA: Diagnosis not present

## 2013-08-29 DIAGNOSIS — N2581 Secondary hyperparathyroidism of renal origin: Secondary | ICD-10-CM | POA: Diagnosis not present

## 2013-08-29 DIAGNOSIS — Z23 Encounter for immunization: Secondary | ICD-10-CM | POA: Diagnosis not present

## 2013-08-31 DIAGNOSIS — D631 Anemia in chronic kidney disease: Secondary | ICD-10-CM | POA: Diagnosis not present

## 2013-08-31 DIAGNOSIS — C8589 Other specified types of non-Hodgkin lymphoma, extranodal and solid organ sites: Secondary | ICD-10-CM | POA: Diagnosis not present

## 2013-08-31 DIAGNOSIS — I798 Other disorders of arteries, arterioles and capillaries in diseases classified elsewhere: Secondary | ICD-10-CM | POA: Diagnosis not present

## 2013-08-31 DIAGNOSIS — Z7901 Long term (current) use of anticoagulants: Secondary | ICD-10-CM | POA: Diagnosis not present

## 2013-08-31 DIAGNOSIS — E1139 Type 2 diabetes mellitus with other diabetic ophthalmic complication: Secondary | ICD-10-CM | POA: Diagnosis not present

## 2013-08-31 DIAGNOSIS — E1129 Type 2 diabetes mellitus with other diabetic kidney complication: Secondary | ICD-10-CM | POA: Diagnosis not present

## 2013-08-31 DIAGNOSIS — E1149 Type 2 diabetes mellitus with other diabetic neurological complication: Secondary | ICD-10-CM | POA: Diagnosis not present

## 2013-08-31 DIAGNOSIS — E1159 Type 2 diabetes mellitus with other circulatory complications: Secondary | ICD-10-CM | POA: Diagnosis not present

## 2013-08-31 DIAGNOSIS — I82409 Acute embolism and thrombosis of unspecified deep veins of unspecified lower extremity: Secondary | ICD-10-CM | POA: Diagnosis not present

## 2013-08-31 DIAGNOSIS — N039 Chronic nephritic syndrome with unspecified morphologic changes: Secondary | ICD-10-CM | POA: Diagnosis not present

## 2013-08-31 DIAGNOSIS — D509 Iron deficiency anemia, unspecified: Secondary | ICD-10-CM | POA: Diagnosis not present

## 2013-08-31 DIAGNOSIS — Z5181 Encounter for therapeutic drug level monitoring: Secondary | ICD-10-CM | POA: Diagnosis not present

## 2013-08-31 DIAGNOSIS — E1142 Type 2 diabetes mellitus with diabetic polyneuropathy: Secondary | ICD-10-CM | POA: Diagnosis not present

## 2013-08-31 DIAGNOSIS — I129 Hypertensive chronic kidney disease with stage 1 through stage 4 chronic kidney disease, or unspecified chronic kidney disease: Secondary | ICD-10-CM | POA: Diagnosis not present

## 2013-08-31 DIAGNOSIS — Z992 Dependence on renal dialysis: Secondary | ICD-10-CM | POA: Diagnosis not present

## 2013-08-31 DIAGNOSIS — N186 End stage renal disease: Secondary | ICD-10-CM | POA: Diagnosis not present

## 2013-08-31 DIAGNOSIS — N2581 Secondary hyperparathyroidism of renal origin: Secondary | ICD-10-CM | POA: Diagnosis not present

## 2013-09-02 DIAGNOSIS — N186 End stage renal disease: Secondary | ICD-10-CM | POA: Diagnosis not present

## 2013-09-02 DIAGNOSIS — N039 Chronic nephritic syndrome with unspecified morphologic changes: Secondary | ICD-10-CM | POA: Diagnosis not present

## 2013-09-02 DIAGNOSIS — D509 Iron deficiency anemia, unspecified: Secondary | ICD-10-CM | POA: Diagnosis not present

## 2013-09-02 DIAGNOSIS — E1129 Type 2 diabetes mellitus with other diabetic kidney complication: Secondary | ICD-10-CM | POA: Diagnosis not present

## 2013-09-02 DIAGNOSIS — N2581 Secondary hyperparathyroidism of renal origin: Secondary | ICD-10-CM | POA: Diagnosis not present

## 2013-09-02 DIAGNOSIS — D631 Anemia in chronic kidney disease: Secondary | ICD-10-CM | POA: Diagnosis not present

## 2013-09-05 DIAGNOSIS — N2581 Secondary hyperparathyroidism of renal origin: Secondary | ICD-10-CM | POA: Diagnosis not present

## 2013-09-05 DIAGNOSIS — N039 Chronic nephritic syndrome with unspecified morphologic changes: Secondary | ICD-10-CM | POA: Diagnosis not present

## 2013-09-05 DIAGNOSIS — I82409 Acute embolism and thrombosis of unspecified deep veins of unspecified lower extremity: Secondary | ICD-10-CM | POA: Diagnosis not present

## 2013-09-05 DIAGNOSIS — D509 Iron deficiency anemia, unspecified: Secondary | ICD-10-CM | POA: Diagnosis not present

## 2013-09-05 DIAGNOSIS — E1129 Type 2 diabetes mellitus with other diabetic kidney complication: Secondary | ICD-10-CM | POA: Diagnosis not present

## 2013-09-05 DIAGNOSIS — D631 Anemia in chronic kidney disease: Secondary | ICD-10-CM | POA: Diagnosis not present

## 2013-09-05 DIAGNOSIS — I798 Other disorders of arteries, arterioles and capillaries in diseases classified elsewhere: Secondary | ICD-10-CM | POA: Diagnosis not present

## 2013-09-05 DIAGNOSIS — N186 End stage renal disease: Secondary | ICD-10-CM | POA: Diagnosis not present

## 2013-09-05 DIAGNOSIS — E1159 Type 2 diabetes mellitus with other circulatory complications: Secondary | ICD-10-CM | POA: Diagnosis not present

## 2013-09-05 DIAGNOSIS — I129 Hypertensive chronic kidney disease with stage 1 through stage 4 chronic kidney disease, or unspecified chronic kidney disease: Secondary | ICD-10-CM | POA: Diagnosis not present

## 2013-09-07 DIAGNOSIS — D509 Iron deficiency anemia, unspecified: Secondary | ICD-10-CM | POA: Diagnosis not present

## 2013-09-07 DIAGNOSIS — E1129 Type 2 diabetes mellitus with other diabetic kidney complication: Secondary | ICD-10-CM | POA: Diagnosis not present

## 2013-09-07 DIAGNOSIS — N186 End stage renal disease: Secondary | ICD-10-CM | POA: Diagnosis not present

## 2013-09-07 DIAGNOSIS — N2581 Secondary hyperparathyroidism of renal origin: Secondary | ICD-10-CM | POA: Diagnosis not present

## 2013-09-07 DIAGNOSIS — D631 Anemia in chronic kidney disease: Secondary | ICD-10-CM | POA: Diagnosis not present

## 2013-09-08 DIAGNOSIS — I129 Hypertensive chronic kidney disease with stage 1 through stage 4 chronic kidney disease, or unspecified chronic kidney disease: Secondary | ICD-10-CM | POA: Diagnosis not present

## 2013-09-08 DIAGNOSIS — Z7901 Long term (current) use of anticoagulants: Secondary | ICD-10-CM | POA: Diagnosis not present

## 2013-09-08 DIAGNOSIS — E1129 Type 2 diabetes mellitus with other diabetic kidney complication: Secondary | ICD-10-CM | POA: Diagnosis not present

## 2013-09-08 DIAGNOSIS — N186 End stage renal disease: Secondary | ICD-10-CM | POA: Diagnosis not present

## 2013-09-08 DIAGNOSIS — I798 Other disorders of arteries, arterioles and capillaries in diseases classified elsewhere: Secondary | ICD-10-CM | POA: Diagnosis not present

## 2013-09-08 DIAGNOSIS — I82409 Acute embolism and thrombosis of unspecified deep veins of unspecified lower extremity: Secondary | ICD-10-CM | POA: Diagnosis not present

## 2013-09-08 DIAGNOSIS — E1139 Type 2 diabetes mellitus with other diabetic ophthalmic complication: Secondary | ICD-10-CM | POA: Diagnosis not present

## 2013-09-08 DIAGNOSIS — E1159 Type 2 diabetes mellitus with other circulatory complications: Secondary | ICD-10-CM | POA: Diagnosis not present

## 2013-09-09 DIAGNOSIS — E1129 Type 2 diabetes mellitus with other diabetic kidney complication: Secondary | ICD-10-CM | POA: Diagnosis not present

## 2013-09-09 DIAGNOSIS — N2581 Secondary hyperparathyroidism of renal origin: Secondary | ICD-10-CM | POA: Diagnosis not present

## 2013-09-09 DIAGNOSIS — N039 Chronic nephritic syndrome with unspecified morphologic changes: Secondary | ICD-10-CM | POA: Diagnosis not present

## 2013-09-09 DIAGNOSIS — D631 Anemia in chronic kidney disease: Secondary | ICD-10-CM | POA: Diagnosis not present

## 2013-09-09 DIAGNOSIS — N186 End stage renal disease: Secondary | ICD-10-CM | POA: Diagnosis not present

## 2013-09-09 DIAGNOSIS — D509 Iron deficiency anemia, unspecified: Secondary | ICD-10-CM | POA: Diagnosis not present

## 2013-09-11 DIAGNOSIS — I129 Hypertensive chronic kidney disease with stage 1 through stage 4 chronic kidney disease, or unspecified chronic kidney disease: Secondary | ICD-10-CM | POA: Diagnosis not present

## 2013-09-11 DIAGNOSIS — E1129 Type 2 diabetes mellitus with other diabetic kidney complication: Secondary | ICD-10-CM | POA: Diagnosis not present

## 2013-09-11 DIAGNOSIS — E1159 Type 2 diabetes mellitus with other circulatory complications: Secondary | ICD-10-CM | POA: Diagnosis not present

## 2013-09-11 DIAGNOSIS — N186 End stage renal disease: Secondary | ICD-10-CM | POA: Diagnosis not present

## 2013-09-11 DIAGNOSIS — I798 Other disorders of arteries, arterioles and capillaries in diseases classified elsewhere: Secondary | ICD-10-CM | POA: Diagnosis not present

## 2013-09-11 DIAGNOSIS — I82409 Acute embolism and thrombosis of unspecified deep veins of unspecified lower extremity: Secondary | ICD-10-CM | POA: Diagnosis not present

## 2013-09-12 DIAGNOSIS — N186 End stage renal disease: Secondary | ICD-10-CM | POA: Diagnosis not present

## 2013-09-12 DIAGNOSIS — N2581 Secondary hyperparathyroidism of renal origin: Secondary | ICD-10-CM | POA: Diagnosis not present

## 2013-09-12 DIAGNOSIS — E1129 Type 2 diabetes mellitus with other diabetic kidney complication: Secondary | ICD-10-CM | POA: Diagnosis not present

## 2013-09-12 DIAGNOSIS — D509 Iron deficiency anemia, unspecified: Secondary | ICD-10-CM | POA: Diagnosis not present

## 2013-09-12 DIAGNOSIS — N039 Chronic nephritic syndrome with unspecified morphologic changes: Secondary | ICD-10-CM | POA: Diagnosis not present

## 2013-09-12 DIAGNOSIS — D631 Anemia in chronic kidney disease: Secondary | ICD-10-CM | POA: Diagnosis not present

## 2013-09-13 DIAGNOSIS — I129 Hypertensive chronic kidney disease with stage 1 through stage 4 chronic kidney disease, or unspecified chronic kidney disease: Secondary | ICD-10-CM | POA: Diagnosis not present

## 2013-09-13 DIAGNOSIS — I82409 Acute embolism and thrombosis of unspecified deep veins of unspecified lower extremity: Secondary | ICD-10-CM | POA: Diagnosis not present

## 2013-09-13 DIAGNOSIS — N186 End stage renal disease: Secondary | ICD-10-CM | POA: Diagnosis not present

## 2013-09-13 DIAGNOSIS — E1159 Type 2 diabetes mellitus with other circulatory complications: Secondary | ICD-10-CM | POA: Diagnosis not present

## 2013-09-13 DIAGNOSIS — E1129 Type 2 diabetes mellitus with other diabetic kidney complication: Secondary | ICD-10-CM | POA: Diagnosis not present

## 2013-09-13 DIAGNOSIS — I798 Other disorders of arteries, arterioles and capillaries in diseases classified elsewhere: Secondary | ICD-10-CM | POA: Diagnosis not present

## 2013-09-14 DIAGNOSIS — N186 End stage renal disease: Secondary | ICD-10-CM | POA: Diagnosis not present

## 2013-09-14 DIAGNOSIS — N039 Chronic nephritic syndrome with unspecified morphologic changes: Secondary | ICD-10-CM | POA: Diagnosis not present

## 2013-09-14 DIAGNOSIS — D509 Iron deficiency anemia, unspecified: Secondary | ICD-10-CM | POA: Diagnosis not present

## 2013-09-14 DIAGNOSIS — N2581 Secondary hyperparathyroidism of renal origin: Secondary | ICD-10-CM | POA: Diagnosis not present

## 2013-09-14 DIAGNOSIS — E1129 Type 2 diabetes mellitus with other diabetic kidney complication: Secondary | ICD-10-CM | POA: Diagnosis not present

## 2013-09-14 DIAGNOSIS — D631 Anemia in chronic kidney disease: Secondary | ICD-10-CM | POA: Diagnosis not present

## 2013-09-15 DIAGNOSIS — N186 End stage renal disease: Secondary | ICD-10-CM | POA: Diagnosis not present

## 2013-09-15 DIAGNOSIS — I129 Hypertensive chronic kidney disease with stage 1 through stage 4 chronic kidney disease, or unspecified chronic kidney disease: Secondary | ICD-10-CM | POA: Diagnosis not present

## 2013-09-15 DIAGNOSIS — I798 Other disorders of arteries, arterioles and capillaries in diseases classified elsewhere: Secondary | ICD-10-CM | POA: Diagnosis not present

## 2013-09-15 DIAGNOSIS — E1129 Type 2 diabetes mellitus with other diabetic kidney complication: Secondary | ICD-10-CM | POA: Diagnosis not present

## 2013-09-15 DIAGNOSIS — I82409 Acute embolism and thrombosis of unspecified deep veins of unspecified lower extremity: Secondary | ICD-10-CM | POA: Diagnosis not present

## 2013-09-15 DIAGNOSIS — E1159 Type 2 diabetes mellitus with other circulatory complications: Secondary | ICD-10-CM | POA: Diagnosis not present

## 2013-09-16 DIAGNOSIS — N039 Chronic nephritic syndrome with unspecified morphologic changes: Secondary | ICD-10-CM | POA: Diagnosis not present

## 2013-09-16 DIAGNOSIS — N2581 Secondary hyperparathyroidism of renal origin: Secondary | ICD-10-CM | POA: Diagnosis not present

## 2013-09-16 DIAGNOSIS — E1129 Type 2 diabetes mellitus with other diabetic kidney complication: Secondary | ICD-10-CM | POA: Diagnosis not present

## 2013-09-16 DIAGNOSIS — N186 End stage renal disease: Secondary | ICD-10-CM | POA: Diagnosis not present

## 2013-09-16 DIAGNOSIS — D509 Iron deficiency anemia, unspecified: Secondary | ICD-10-CM | POA: Diagnosis not present

## 2013-09-16 DIAGNOSIS — D631 Anemia in chronic kidney disease: Secondary | ICD-10-CM | POA: Diagnosis not present

## 2013-09-18 DIAGNOSIS — I82409 Acute embolism and thrombosis of unspecified deep veins of unspecified lower extremity: Secondary | ICD-10-CM | POA: Diagnosis not present

## 2013-09-18 DIAGNOSIS — Z7901 Long term (current) use of anticoagulants: Secondary | ICD-10-CM | POA: Diagnosis not present

## 2013-09-18 DIAGNOSIS — N186 End stage renal disease: Secondary | ICD-10-CM | POA: Diagnosis not present

## 2013-09-18 DIAGNOSIS — I129 Hypertensive chronic kidney disease with stage 1 through stage 4 chronic kidney disease, or unspecified chronic kidney disease: Secondary | ICD-10-CM | POA: Diagnosis not present

## 2013-09-18 DIAGNOSIS — E1129 Type 2 diabetes mellitus with other diabetic kidney complication: Secondary | ICD-10-CM | POA: Diagnosis not present

## 2013-09-18 DIAGNOSIS — E1159 Type 2 diabetes mellitus with other circulatory complications: Secondary | ICD-10-CM | POA: Diagnosis not present

## 2013-09-18 DIAGNOSIS — I798 Other disorders of arteries, arterioles and capillaries in diseases classified elsewhere: Secondary | ICD-10-CM | POA: Diagnosis not present

## 2013-09-19 DIAGNOSIS — N2581 Secondary hyperparathyroidism of renal origin: Secondary | ICD-10-CM | POA: Diagnosis not present

## 2013-09-19 DIAGNOSIS — D509 Iron deficiency anemia, unspecified: Secondary | ICD-10-CM | POA: Diagnosis not present

## 2013-09-19 DIAGNOSIS — D631 Anemia in chronic kidney disease: Secondary | ICD-10-CM | POA: Diagnosis not present

## 2013-09-19 DIAGNOSIS — N186 End stage renal disease: Secondary | ICD-10-CM | POA: Diagnosis not present

## 2013-09-19 DIAGNOSIS — E1129 Type 2 diabetes mellitus with other diabetic kidney complication: Secondary | ICD-10-CM | POA: Diagnosis not present

## 2013-09-21 DIAGNOSIS — N2581 Secondary hyperparathyroidism of renal origin: Secondary | ICD-10-CM | POA: Diagnosis not present

## 2013-09-21 DIAGNOSIS — D509 Iron deficiency anemia, unspecified: Secondary | ICD-10-CM | POA: Diagnosis not present

## 2013-09-21 DIAGNOSIS — D631 Anemia in chronic kidney disease: Secondary | ICD-10-CM | POA: Diagnosis not present

## 2013-09-21 DIAGNOSIS — E1129 Type 2 diabetes mellitus with other diabetic kidney complication: Secondary | ICD-10-CM | POA: Diagnosis not present

## 2013-09-21 DIAGNOSIS — N186 End stage renal disease: Secondary | ICD-10-CM | POA: Diagnosis not present

## 2013-09-23 DIAGNOSIS — D509 Iron deficiency anemia, unspecified: Secondary | ICD-10-CM | POA: Insufficient documentation

## 2013-09-23 DIAGNOSIS — N186 End stage renal disease: Secondary | ICD-10-CM | POA: Diagnosis not present

## 2013-09-23 DIAGNOSIS — D631 Anemia in chronic kidney disease: Secondary | ICD-10-CM | POA: Diagnosis not present

## 2013-09-23 DIAGNOSIS — E1129 Type 2 diabetes mellitus with other diabetic kidney complication: Secondary | ICD-10-CM | POA: Diagnosis not present

## 2013-09-23 DIAGNOSIS — N2581 Secondary hyperparathyroidism of renal origin: Secondary | ICD-10-CM | POA: Diagnosis not present

## 2013-09-26 DIAGNOSIS — D631 Anemia in chronic kidney disease: Secondary | ICD-10-CM | POA: Diagnosis not present

## 2013-09-26 DIAGNOSIS — N2581 Secondary hyperparathyroidism of renal origin: Secondary | ICD-10-CM | POA: Diagnosis not present

## 2013-09-26 DIAGNOSIS — N186 End stage renal disease: Secondary | ICD-10-CM | POA: Diagnosis not present

## 2013-09-26 DIAGNOSIS — D509 Iron deficiency anemia, unspecified: Secondary | ICD-10-CM | POA: Diagnosis not present

## 2013-09-26 DIAGNOSIS — E1129 Type 2 diabetes mellitus with other diabetic kidney complication: Secondary | ICD-10-CM | POA: Diagnosis not present

## 2013-09-27 DIAGNOSIS — E1129 Type 2 diabetes mellitus with other diabetic kidney complication: Secondary | ICD-10-CM | POA: Diagnosis not present

## 2013-09-27 DIAGNOSIS — N186 End stage renal disease: Secondary | ICD-10-CM | POA: Diagnosis not present

## 2013-09-27 DIAGNOSIS — I798 Other disorders of arteries, arterioles and capillaries in diseases classified elsewhere: Secondary | ICD-10-CM | POA: Diagnosis not present

## 2013-09-27 DIAGNOSIS — E1159 Type 2 diabetes mellitus with other circulatory complications: Secondary | ICD-10-CM | POA: Diagnosis not present

## 2013-09-27 DIAGNOSIS — I129 Hypertensive chronic kidney disease with stage 1 through stage 4 chronic kidney disease, or unspecified chronic kidney disease: Secondary | ICD-10-CM | POA: Diagnosis not present

## 2013-09-27 DIAGNOSIS — I82409 Acute embolism and thrombosis of unspecified deep veins of unspecified lower extremity: Secondary | ICD-10-CM | POA: Diagnosis not present

## 2013-09-28 DIAGNOSIS — N039 Chronic nephritic syndrome with unspecified morphologic changes: Secondary | ICD-10-CM | POA: Diagnosis not present

## 2013-09-28 DIAGNOSIS — D631 Anemia in chronic kidney disease: Secondary | ICD-10-CM | POA: Diagnosis not present

## 2013-09-28 DIAGNOSIS — N2581 Secondary hyperparathyroidism of renal origin: Secondary | ICD-10-CM | POA: Diagnosis not present

## 2013-09-28 DIAGNOSIS — E1129 Type 2 diabetes mellitus with other diabetic kidney complication: Secondary | ICD-10-CM | POA: Diagnosis not present

## 2013-09-28 DIAGNOSIS — D509 Iron deficiency anemia, unspecified: Secondary | ICD-10-CM | POA: Diagnosis not present

## 2013-09-28 DIAGNOSIS — N186 End stage renal disease: Secondary | ICD-10-CM | POA: Diagnosis not present

## 2013-09-30 DIAGNOSIS — D638 Anemia in other chronic diseases classified elsewhere: Secondary | ICD-10-CM | POA: Diagnosis not present

## 2013-09-30 DIAGNOSIS — N186 End stage renal disease: Secondary | ICD-10-CM | POA: Diagnosis not present

## 2013-09-30 DIAGNOSIS — E1129 Type 2 diabetes mellitus with other diabetic kidney complication: Secondary | ICD-10-CM | POA: Diagnosis not present

## 2013-09-30 DIAGNOSIS — N039 Chronic nephritic syndrome with unspecified morphologic changes: Secondary | ICD-10-CM | POA: Diagnosis not present

## 2013-09-30 DIAGNOSIS — R0602 Shortness of breath: Secondary | ICD-10-CM | POA: Diagnosis not present

## 2013-09-30 DIAGNOSIS — D631 Anemia in chronic kidney disease: Secondary | ICD-10-CM | POA: Diagnosis not present

## 2013-09-30 DIAGNOSIS — N2581 Secondary hyperparathyroidism of renal origin: Secondary | ICD-10-CM | POA: Diagnosis not present

## 2013-09-30 DIAGNOSIS — D509 Iron deficiency anemia, unspecified: Secondary | ICD-10-CM | POA: Diagnosis not present

## 2013-10-02 DIAGNOSIS — I82409 Acute embolism and thrombosis of unspecified deep veins of unspecified lower extremity: Secondary | ICD-10-CM | POA: Diagnosis not present

## 2013-10-02 DIAGNOSIS — Z7901 Long term (current) use of anticoagulants: Secondary | ICD-10-CM | POA: Diagnosis not present

## 2013-10-16 DIAGNOSIS — I871 Compression of vein: Secondary | ICD-10-CM | POA: Diagnosis not present

## 2013-10-16 DIAGNOSIS — T82898A Other specified complication of vascular prosthetic devices, implants and grafts, initial encounter: Secondary | ICD-10-CM | POA: Diagnosis not present

## 2013-10-16 DIAGNOSIS — N186 End stage renal disease: Secondary | ICD-10-CM | POA: Diagnosis not present

## 2013-10-29 DIAGNOSIS — N186 End stage renal disease: Secondary | ICD-10-CM | POA: Diagnosis not present

## 2013-10-31 DIAGNOSIS — N186 End stage renal disease: Secondary | ICD-10-CM | POA: Diagnosis not present

## 2013-10-31 DIAGNOSIS — D638 Anemia in other chronic diseases classified elsewhere: Secondary | ICD-10-CM | POA: Diagnosis not present

## 2013-10-31 DIAGNOSIS — E1129 Type 2 diabetes mellitus with other diabetic kidney complication: Secondary | ICD-10-CM | POA: Diagnosis not present

## 2013-10-31 DIAGNOSIS — D509 Iron deficiency anemia, unspecified: Secondary | ICD-10-CM | POA: Diagnosis not present

## 2013-10-31 DIAGNOSIS — N2581 Secondary hyperparathyroidism of renal origin: Secondary | ICD-10-CM | POA: Diagnosis not present

## 2013-11-17 ENCOUNTER — Ambulatory Visit (INDEPENDENT_AMBULATORY_CARE_PROVIDER_SITE_OTHER): Payer: Medicare Other | Admitting: Ophthalmology

## 2013-11-24 ENCOUNTER — Ambulatory Visit (INDEPENDENT_AMBULATORY_CARE_PROVIDER_SITE_OTHER): Payer: Medicare Other | Admitting: Ophthalmology

## 2013-11-24 DIAGNOSIS — H35039 Hypertensive retinopathy, unspecified eye: Secondary | ICD-10-CM | POA: Diagnosis not present

## 2013-11-24 DIAGNOSIS — E1165 Type 2 diabetes mellitus with hyperglycemia: Secondary | ICD-10-CM | POA: Diagnosis not present

## 2013-11-24 DIAGNOSIS — I10 Essential (primary) hypertension: Secondary | ICD-10-CM

## 2013-11-24 DIAGNOSIS — H2510 Age-related nuclear cataract, unspecified eye: Secondary | ICD-10-CM

## 2013-11-24 DIAGNOSIS — E1139 Type 2 diabetes mellitus with other diabetic ophthalmic complication: Secondary | ICD-10-CM | POA: Diagnosis not present

## 2013-11-24 DIAGNOSIS — E11359 Type 2 diabetes mellitus with proliferative diabetic retinopathy without macular edema: Secondary | ICD-10-CM

## 2013-11-24 DIAGNOSIS — H43819 Vitreous degeneration, unspecified eye: Secondary | ICD-10-CM

## 2013-11-28 DIAGNOSIS — N186 End stage renal disease: Secondary | ICD-10-CM | POA: Diagnosis not present

## 2013-11-30 DIAGNOSIS — N186 End stage renal disease: Secondary | ICD-10-CM | POA: Diagnosis not present

## 2013-11-30 DIAGNOSIS — D638 Anemia in other chronic diseases classified elsewhere: Secondary | ICD-10-CM | POA: Diagnosis not present

## 2013-11-30 DIAGNOSIS — N2581 Secondary hyperparathyroidism of renal origin: Secondary | ICD-10-CM | POA: Diagnosis not present

## 2013-11-30 DIAGNOSIS — E1129 Type 2 diabetes mellitus with other diabetic kidney complication: Secondary | ICD-10-CM | POA: Diagnosis not present

## 2013-11-30 DIAGNOSIS — D509 Iron deficiency anemia, unspecified: Secondary | ICD-10-CM | POA: Diagnosis not present

## 2013-12-13 DIAGNOSIS — Z961 Presence of intraocular lens: Secondary | ICD-10-CM | POA: Diagnosis not present

## 2013-12-13 DIAGNOSIS — H25019 Cortical age-related cataract, unspecified eye: Secondary | ICD-10-CM | POA: Diagnosis not present

## 2013-12-13 DIAGNOSIS — H2510 Age-related nuclear cataract, unspecified eye: Secondary | ICD-10-CM | POA: Diagnosis not present

## 2013-12-13 DIAGNOSIS — E1139 Type 2 diabetes mellitus with other diabetic ophthalmic complication: Secondary | ICD-10-CM | POA: Diagnosis not present

## 2013-12-20 ENCOUNTER — Encounter: Payer: Self-pay | Admitting: Internal Medicine

## 2013-12-21 DIAGNOSIS — E1129 Type 2 diabetes mellitus with other diabetic kidney complication: Secondary | ICD-10-CM | POA: Diagnosis not present

## 2013-12-29 DIAGNOSIS — N186 End stage renal disease: Secondary | ICD-10-CM | POA: Diagnosis not present

## 2013-12-30 DIAGNOSIS — E1129 Type 2 diabetes mellitus with other diabetic kidney complication: Secondary | ICD-10-CM | POA: Diagnosis not present

## 2013-12-30 DIAGNOSIS — N186 End stage renal disease: Secondary | ICD-10-CM | POA: Diagnosis not present

## 2013-12-30 DIAGNOSIS — N2581 Secondary hyperparathyroidism of renal origin: Secondary | ICD-10-CM | POA: Diagnosis not present

## 2013-12-30 DIAGNOSIS — D509 Iron deficiency anemia, unspecified: Secondary | ICD-10-CM | POA: Diagnosis not present

## 2013-12-30 DIAGNOSIS — D638 Anemia in other chronic diseases classified elsewhere: Secondary | ICD-10-CM | POA: Diagnosis not present

## 2013-12-30 DIAGNOSIS — D631 Anemia in chronic kidney disease: Secondary | ICD-10-CM | POA: Diagnosis not present

## 2014-01-02 DIAGNOSIS — D509 Iron deficiency anemia, unspecified: Secondary | ICD-10-CM | POA: Diagnosis not present

## 2014-01-02 DIAGNOSIS — N2581 Secondary hyperparathyroidism of renal origin: Secondary | ICD-10-CM | POA: Diagnosis not present

## 2014-01-02 DIAGNOSIS — D638 Anemia in other chronic diseases classified elsewhere: Secondary | ICD-10-CM | POA: Diagnosis not present

## 2014-01-02 DIAGNOSIS — D631 Anemia in chronic kidney disease: Secondary | ICD-10-CM | POA: Diagnosis not present

## 2014-01-02 DIAGNOSIS — E1129 Type 2 diabetes mellitus with other diabetic kidney complication: Secondary | ICD-10-CM | POA: Diagnosis not present

## 2014-01-02 DIAGNOSIS — N186 End stage renal disease: Secondary | ICD-10-CM | POA: Diagnosis not present

## 2014-01-04 DIAGNOSIS — D631 Anemia in chronic kidney disease: Secondary | ICD-10-CM | POA: Diagnosis not present

## 2014-01-04 DIAGNOSIS — D509 Iron deficiency anemia, unspecified: Secondary | ICD-10-CM | POA: Diagnosis not present

## 2014-01-04 DIAGNOSIS — N186 End stage renal disease: Secondary | ICD-10-CM | POA: Diagnosis not present

## 2014-01-04 DIAGNOSIS — D638 Anemia in other chronic diseases classified elsewhere: Secondary | ICD-10-CM | POA: Diagnosis not present

## 2014-01-04 DIAGNOSIS — N2581 Secondary hyperparathyroidism of renal origin: Secondary | ICD-10-CM | POA: Diagnosis not present

## 2014-01-04 DIAGNOSIS — N039 Chronic nephritic syndrome with unspecified morphologic changes: Secondary | ICD-10-CM | POA: Diagnosis not present

## 2014-01-04 DIAGNOSIS — E1129 Type 2 diabetes mellitus with other diabetic kidney complication: Secondary | ICD-10-CM | POA: Diagnosis not present

## 2014-01-06 DIAGNOSIS — N186 End stage renal disease: Secondary | ICD-10-CM | POA: Diagnosis not present

## 2014-01-06 DIAGNOSIS — D631 Anemia in chronic kidney disease: Secondary | ICD-10-CM | POA: Diagnosis not present

## 2014-01-06 DIAGNOSIS — N039 Chronic nephritic syndrome with unspecified morphologic changes: Secondary | ICD-10-CM | POA: Diagnosis not present

## 2014-01-06 DIAGNOSIS — D638 Anemia in other chronic diseases classified elsewhere: Secondary | ICD-10-CM | POA: Diagnosis not present

## 2014-01-06 DIAGNOSIS — N2581 Secondary hyperparathyroidism of renal origin: Secondary | ICD-10-CM | POA: Diagnosis not present

## 2014-01-06 DIAGNOSIS — D509 Iron deficiency anemia, unspecified: Secondary | ICD-10-CM | POA: Diagnosis not present

## 2014-01-06 DIAGNOSIS — E1129 Type 2 diabetes mellitus with other diabetic kidney complication: Secondary | ICD-10-CM | POA: Diagnosis not present

## 2014-01-09 DIAGNOSIS — D638 Anemia in other chronic diseases classified elsewhere: Secondary | ICD-10-CM | POA: Diagnosis not present

## 2014-01-09 DIAGNOSIS — N186 End stage renal disease: Secondary | ICD-10-CM | POA: Diagnosis not present

## 2014-01-09 DIAGNOSIS — N2581 Secondary hyperparathyroidism of renal origin: Secondary | ICD-10-CM | POA: Diagnosis not present

## 2014-01-09 DIAGNOSIS — D509 Iron deficiency anemia, unspecified: Secondary | ICD-10-CM | POA: Diagnosis not present

## 2014-01-09 DIAGNOSIS — E1129 Type 2 diabetes mellitus with other diabetic kidney complication: Secondary | ICD-10-CM | POA: Diagnosis not present

## 2014-01-09 DIAGNOSIS — D631 Anemia in chronic kidney disease: Secondary | ICD-10-CM | POA: Diagnosis not present

## 2014-01-11 DIAGNOSIS — D509 Iron deficiency anemia, unspecified: Secondary | ICD-10-CM | POA: Diagnosis not present

## 2014-01-11 DIAGNOSIS — N2581 Secondary hyperparathyroidism of renal origin: Secondary | ICD-10-CM | POA: Diagnosis not present

## 2014-01-11 DIAGNOSIS — N186 End stage renal disease: Secondary | ICD-10-CM | POA: Diagnosis not present

## 2014-01-11 DIAGNOSIS — D631 Anemia in chronic kidney disease: Secondary | ICD-10-CM | POA: Diagnosis not present

## 2014-01-11 DIAGNOSIS — E1129 Type 2 diabetes mellitus with other diabetic kidney complication: Secondary | ICD-10-CM | POA: Diagnosis not present

## 2014-01-11 DIAGNOSIS — D638 Anemia in other chronic diseases classified elsewhere: Secondary | ICD-10-CM | POA: Diagnosis not present

## 2014-01-13 DIAGNOSIS — N186 End stage renal disease: Secondary | ICD-10-CM | POA: Diagnosis not present

## 2014-01-13 DIAGNOSIS — D638 Anemia in other chronic diseases classified elsewhere: Secondary | ICD-10-CM | POA: Diagnosis not present

## 2014-01-13 DIAGNOSIS — D631 Anemia in chronic kidney disease: Secondary | ICD-10-CM | POA: Diagnosis not present

## 2014-01-13 DIAGNOSIS — E1129 Type 2 diabetes mellitus with other diabetic kidney complication: Secondary | ICD-10-CM | POA: Diagnosis not present

## 2014-01-13 DIAGNOSIS — D509 Iron deficiency anemia, unspecified: Secondary | ICD-10-CM | POA: Diagnosis not present

## 2014-01-13 DIAGNOSIS — N2581 Secondary hyperparathyroidism of renal origin: Secondary | ICD-10-CM | POA: Diagnosis not present

## 2014-01-16 DIAGNOSIS — D631 Anemia in chronic kidney disease: Secondary | ICD-10-CM | POA: Diagnosis not present

## 2014-01-16 DIAGNOSIS — E1129 Type 2 diabetes mellitus with other diabetic kidney complication: Secondary | ICD-10-CM | POA: Diagnosis not present

## 2014-01-16 DIAGNOSIS — D509 Iron deficiency anemia, unspecified: Secondary | ICD-10-CM | POA: Diagnosis not present

## 2014-01-16 DIAGNOSIS — N186 End stage renal disease: Secondary | ICD-10-CM | POA: Diagnosis not present

## 2014-01-16 DIAGNOSIS — D638 Anemia in other chronic diseases classified elsewhere: Secondary | ICD-10-CM | POA: Diagnosis not present

## 2014-01-16 DIAGNOSIS — N2581 Secondary hyperparathyroidism of renal origin: Secondary | ICD-10-CM | POA: Diagnosis not present

## 2014-01-18 DIAGNOSIS — N039 Chronic nephritic syndrome with unspecified morphologic changes: Secondary | ICD-10-CM | POA: Diagnosis not present

## 2014-01-18 DIAGNOSIS — N2581 Secondary hyperparathyroidism of renal origin: Secondary | ICD-10-CM | POA: Diagnosis not present

## 2014-01-18 DIAGNOSIS — D631 Anemia in chronic kidney disease: Secondary | ICD-10-CM | POA: Diagnosis not present

## 2014-01-18 DIAGNOSIS — N186 End stage renal disease: Secondary | ICD-10-CM | POA: Diagnosis not present

## 2014-01-18 DIAGNOSIS — D638 Anemia in other chronic diseases classified elsewhere: Secondary | ICD-10-CM | POA: Diagnosis not present

## 2014-01-18 DIAGNOSIS — D509 Iron deficiency anemia, unspecified: Secondary | ICD-10-CM | POA: Diagnosis not present

## 2014-01-18 DIAGNOSIS — E1129 Type 2 diabetes mellitus with other diabetic kidney complication: Secondary | ICD-10-CM | POA: Diagnosis not present

## 2014-01-20 DIAGNOSIS — D638 Anemia in other chronic diseases classified elsewhere: Secondary | ICD-10-CM | POA: Diagnosis not present

## 2014-01-20 DIAGNOSIS — D631 Anemia in chronic kidney disease: Secondary | ICD-10-CM | POA: Diagnosis not present

## 2014-01-20 DIAGNOSIS — E1129 Type 2 diabetes mellitus with other diabetic kidney complication: Secondary | ICD-10-CM | POA: Diagnosis not present

## 2014-01-20 DIAGNOSIS — D509 Iron deficiency anemia, unspecified: Secondary | ICD-10-CM | POA: Diagnosis not present

## 2014-01-20 DIAGNOSIS — N186 End stage renal disease: Secondary | ICD-10-CM | POA: Diagnosis not present

## 2014-01-20 DIAGNOSIS — N2581 Secondary hyperparathyroidism of renal origin: Secondary | ICD-10-CM | POA: Diagnosis not present

## 2014-01-23 DIAGNOSIS — D638 Anemia in other chronic diseases classified elsewhere: Secondary | ICD-10-CM | POA: Diagnosis not present

## 2014-01-23 DIAGNOSIS — D509 Iron deficiency anemia, unspecified: Secondary | ICD-10-CM | POA: Diagnosis not present

## 2014-01-23 DIAGNOSIS — E1129 Type 2 diabetes mellitus with other diabetic kidney complication: Secondary | ICD-10-CM | POA: Diagnosis not present

## 2014-01-23 DIAGNOSIS — N2581 Secondary hyperparathyroidism of renal origin: Secondary | ICD-10-CM | POA: Diagnosis not present

## 2014-01-23 DIAGNOSIS — N039 Chronic nephritic syndrome with unspecified morphologic changes: Secondary | ICD-10-CM | POA: Diagnosis not present

## 2014-01-23 DIAGNOSIS — N186 End stage renal disease: Secondary | ICD-10-CM | POA: Diagnosis not present

## 2014-01-23 DIAGNOSIS — D631 Anemia in chronic kidney disease: Secondary | ICD-10-CM | POA: Diagnosis not present

## 2014-01-25 DIAGNOSIS — N186 End stage renal disease: Secondary | ICD-10-CM | POA: Diagnosis not present

## 2014-01-25 DIAGNOSIS — D638 Anemia in other chronic diseases classified elsewhere: Secondary | ICD-10-CM | POA: Diagnosis not present

## 2014-01-25 DIAGNOSIS — N2581 Secondary hyperparathyroidism of renal origin: Secondary | ICD-10-CM | POA: Diagnosis not present

## 2014-01-25 DIAGNOSIS — D509 Iron deficiency anemia, unspecified: Secondary | ICD-10-CM | POA: Diagnosis not present

## 2014-01-25 DIAGNOSIS — D631 Anemia in chronic kidney disease: Secondary | ICD-10-CM | POA: Diagnosis not present

## 2014-01-25 DIAGNOSIS — E1129 Type 2 diabetes mellitus with other diabetic kidney complication: Secondary | ICD-10-CM | POA: Diagnosis not present

## 2014-01-27 DIAGNOSIS — N186 End stage renal disease: Secondary | ICD-10-CM | POA: Diagnosis not present

## 2014-01-27 DIAGNOSIS — E1129 Type 2 diabetes mellitus with other diabetic kidney complication: Secondary | ICD-10-CM | POA: Diagnosis not present

## 2014-01-27 DIAGNOSIS — D509 Iron deficiency anemia, unspecified: Secondary | ICD-10-CM | POA: Diagnosis not present

## 2014-01-27 DIAGNOSIS — N2581 Secondary hyperparathyroidism of renal origin: Secondary | ICD-10-CM | POA: Diagnosis not present

## 2014-01-27 DIAGNOSIS — D638 Anemia in other chronic diseases classified elsewhere: Secondary | ICD-10-CM | POA: Diagnosis not present

## 2014-01-27 DIAGNOSIS — D631 Anemia in chronic kidney disease: Secondary | ICD-10-CM | POA: Diagnosis not present

## 2014-01-29 DIAGNOSIS — N186 End stage renal disease: Secondary | ICD-10-CM | POA: Diagnosis not present

## 2014-01-30 DIAGNOSIS — Z23 Encounter for immunization: Secondary | ICD-10-CM | POA: Diagnosis not present

## 2014-01-30 DIAGNOSIS — D638 Anemia in other chronic diseases classified elsewhere: Secondary | ICD-10-CM | POA: Diagnosis not present

## 2014-01-30 DIAGNOSIS — D631 Anemia in chronic kidney disease: Secondary | ICD-10-CM | POA: Diagnosis not present

## 2014-01-30 DIAGNOSIS — N186 End stage renal disease: Secondary | ICD-10-CM | POA: Diagnosis not present

## 2014-01-30 DIAGNOSIS — N2581 Secondary hyperparathyroidism of renal origin: Secondary | ICD-10-CM | POA: Diagnosis not present

## 2014-02-16 DIAGNOSIS — E1149 Type 2 diabetes mellitus with other diabetic neurological complication: Secondary | ICD-10-CM | POA: Diagnosis not present

## 2014-02-16 DIAGNOSIS — E11359 Type 2 diabetes mellitus with proliferative diabetic retinopathy without macular edema: Secondary | ICD-10-CM | POA: Diagnosis not present

## 2014-02-16 DIAGNOSIS — N186 End stage renal disease: Secondary | ICD-10-CM | POA: Diagnosis not present

## 2014-02-16 DIAGNOSIS — Z23 Encounter for immunization: Secondary | ICD-10-CM | POA: Diagnosis not present

## 2014-02-16 DIAGNOSIS — E1139 Type 2 diabetes mellitus with other diabetic ophthalmic complication: Secondary | ICD-10-CM | POA: Diagnosis not present

## 2014-02-16 DIAGNOSIS — E1129 Type 2 diabetes mellitus with other diabetic kidney complication: Secondary | ICD-10-CM | POA: Diagnosis not present

## 2014-02-16 DIAGNOSIS — G609 Hereditary and idiopathic neuropathy, unspecified: Secondary | ICD-10-CM | POA: Diagnosis not present

## 2014-02-16 DIAGNOSIS — I10 Essential (primary) hypertension: Secondary | ICD-10-CM | POA: Diagnosis not present

## 2014-02-20 DIAGNOSIS — H18229 Idiopathic corneal edema, unspecified eye: Secondary | ICD-10-CM | POA: Diagnosis not present

## 2014-02-20 DIAGNOSIS — Z961 Presence of intraocular lens: Secondary | ICD-10-CM | POA: Diagnosis not present

## 2014-02-20 DIAGNOSIS — H2510 Age-related nuclear cataract, unspecified eye: Secondary | ICD-10-CM | POA: Diagnosis not present

## 2014-02-28 DIAGNOSIS — N186 End stage renal disease: Secondary | ICD-10-CM | POA: Diagnosis not present

## 2014-03-01 DIAGNOSIS — E1129 Type 2 diabetes mellitus with other diabetic kidney complication: Secondary | ICD-10-CM | POA: Diagnosis not present

## 2014-03-01 DIAGNOSIS — N2581 Secondary hyperparathyroidism of renal origin: Secondary | ICD-10-CM | POA: Diagnosis not present

## 2014-03-01 DIAGNOSIS — N186 End stage renal disease: Secondary | ICD-10-CM | POA: Diagnosis not present

## 2014-03-01 DIAGNOSIS — D638 Anemia in other chronic diseases classified elsewhere: Secondary | ICD-10-CM | POA: Diagnosis not present

## 2014-03-01 DIAGNOSIS — D631 Anemia in chronic kidney disease: Secondary | ICD-10-CM | POA: Diagnosis not present

## 2014-03-03 DIAGNOSIS — E1129 Type 2 diabetes mellitus with other diabetic kidney complication: Secondary | ICD-10-CM | POA: Diagnosis not present

## 2014-03-03 DIAGNOSIS — D638 Anemia in other chronic diseases classified elsewhere: Secondary | ICD-10-CM | POA: Diagnosis not present

## 2014-03-03 DIAGNOSIS — D631 Anemia in chronic kidney disease: Secondary | ICD-10-CM | POA: Diagnosis not present

## 2014-03-03 DIAGNOSIS — N186 End stage renal disease: Secondary | ICD-10-CM | POA: Diagnosis not present

## 2014-03-03 DIAGNOSIS — N2581 Secondary hyperparathyroidism of renal origin: Secondary | ICD-10-CM | POA: Diagnosis not present

## 2014-03-06 DIAGNOSIS — D631 Anemia in chronic kidney disease: Secondary | ICD-10-CM | POA: Diagnosis not present

## 2014-03-06 DIAGNOSIS — D638 Anemia in other chronic diseases classified elsewhere: Secondary | ICD-10-CM | POA: Diagnosis not present

## 2014-03-06 DIAGNOSIS — E1129 Type 2 diabetes mellitus with other diabetic kidney complication: Secondary | ICD-10-CM | POA: Diagnosis not present

## 2014-03-06 DIAGNOSIS — N2581 Secondary hyperparathyroidism of renal origin: Secondary | ICD-10-CM | POA: Diagnosis not present

## 2014-03-06 DIAGNOSIS — N186 End stage renal disease: Secondary | ICD-10-CM | POA: Diagnosis not present

## 2014-03-08 DIAGNOSIS — N186 End stage renal disease: Secondary | ICD-10-CM | POA: Diagnosis not present

## 2014-03-08 DIAGNOSIS — E1129 Type 2 diabetes mellitus with other diabetic kidney complication: Secondary | ICD-10-CM | POA: Diagnosis not present

## 2014-03-08 DIAGNOSIS — D631 Anemia in chronic kidney disease: Secondary | ICD-10-CM | POA: Diagnosis not present

## 2014-03-08 DIAGNOSIS — D638 Anemia in other chronic diseases classified elsewhere: Secondary | ICD-10-CM | POA: Diagnosis not present

## 2014-03-08 DIAGNOSIS — N2581 Secondary hyperparathyroidism of renal origin: Secondary | ICD-10-CM | POA: Diagnosis not present

## 2014-03-10 DIAGNOSIS — E1129 Type 2 diabetes mellitus with other diabetic kidney complication: Secondary | ICD-10-CM | POA: Diagnosis not present

## 2014-03-10 DIAGNOSIS — N186 End stage renal disease: Secondary | ICD-10-CM | POA: Diagnosis not present

## 2014-03-10 DIAGNOSIS — N2581 Secondary hyperparathyroidism of renal origin: Secondary | ICD-10-CM | POA: Diagnosis not present

## 2014-03-10 DIAGNOSIS — D638 Anemia in other chronic diseases classified elsewhere: Secondary | ICD-10-CM | POA: Diagnosis not present

## 2014-03-10 DIAGNOSIS — D631 Anemia in chronic kidney disease: Secondary | ICD-10-CM | POA: Diagnosis not present

## 2014-03-13 DIAGNOSIS — E1129 Type 2 diabetes mellitus with other diabetic kidney complication: Secondary | ICD-10-CM | POA: Diagnosis not present

## 2014-03-13 DIAGNOSIS — D631 Anemia in chronic kidney disease: Secondary | ICD-10-CM | POA: Diagnosis not present

## 2014-03-13 DIAGNOSIS — D638 Anemia in other chronic diseases classified elsewhere: Secondary | ICD-10-CM | POA: Diagnosis not present

## 2014-03-13 DIAGNOSIS — N2581 Secondary hyperparathyroidism of renal origin: Secondary | ICD-10-CM | POA: Diagnosis not present

## 2014-03-13 DIAGNOSIS — N186 End stage renal disease: Secondary | ICD-10-CM | POA: Diagnosis not present

## 2014-03-15 DIAGNOSIS — N2581 Secondary hyperparathyroidism of renal origin: Secondary | ICD-10-CM | POA: Diagnosis not present

## 2014-03-15 DIAGNOSIS — D638 Anemia in other chronic diseases classified elsewhere: Secondary | ICD-10-CM | POA: Diagnosis not present

## 2014-03-15 DIAGNOSIS — N186 End stage renal disease: Secondary | ICD-10-CM | POA: Diagnosis not present

## 2014-03-15 DIAGNOSIS — E1129 Type 2 diabetes mellitus with other diabetic kidney complication: Secondary | ICD-10-CM | POA: Diagnosis not present

## 2014-03-15 DIAGNOSIS — D631 Anemia in chronic kidney disease: Secondary | ICD-10-CM | POA: Diagnosis not present

## 2014-03-17 DIAGNOSIS — D638 Anemia in other chronic diseases classified elsewhere: Secondary | ICD-10-CM | POA: Diagnosis not present

## 2014-03-17 DIAGNOSIS — N2581 Secondary hyperparathyroidism of renal origin: Secondary | ICD-10-CM | POA: Diagnosis not present

## 2014-03-17 DIAGNOSIS — E1129 Type 2 diabetes mellitus with other diabetic kidney complication: Secondary | ICD-10-CM | POA: Diagnosis not present

## 2014-03-17 DIAGNOSIS — D631 Anemia in chronic kidney disease: Secondary | ICD-10-CM | POA: Diagnosis not present

## 2014-03-17 DIAGNOSIS — N186 End stage renal disease: Secondary | ICD-10-CM | POA: Diagnosis not present

## 2014-03-20 DIAGNOSIS — D638 Anemia in other chronic diseases classified elsewhere: Secondary | ICD-10-CM | POA: Diagnosis not present

## 2014-03-20 DIAGNOSIS — N2581 Secondary hyperparathyroidism of renal origin: Secondary | ICD-10-CM | POA: Diagnosis not present

## 2014-03-20 DIAGNOSIS — D631 Anemia in chronic kidney disease: Secondary | ICD-10-CM | POA: Diagnosis not present

## 2014-03-20 DIAGNOSIS — E1129 Type 2 diabetes mellitus with other diabetic kidney complication: Secondary | ICD-10-CM | POA: Diagnosis not present

## 2014-03-20 DIAGNOSIS — N186 End stage renal disease: Secondary | ICD-10-CM | POA: Diagnosis not present

## 2014-03-21 DIAGNOSIS — I80202 Phlebitis and thrombophlebitis of unspecified deep vessels of left lower extremity: Secondary | ICD-10-CM | POA: Diagnosis not present

## 2014-03-21 DIAGNOSIS — Z7901 Long term (current) use of anticoagulants: Secondary | ICD-10-CM | POA: Diagnosis not present

## 2014-03-22 DIAGNOSIS — N186 End stage renal disease: Secondary | ICD-10-CM | POA: Diagnosis not present

## 2014-03-22 DIAGNOSIS — N2581 Secondary hyperparathyroidism of renal origin: Secondary | ICD-10-CM | POA: Diagnosis not present

## 2014-03-22 DIAGNOSIS — D631 Anemia in chronic kidney disease: Secondary | ICD-10-CM | POA: Diagnosis not present

## 2014-03-22 DIAGNOSIS — D638 Anemia in other chronic diseases classified elsewhere: Secondary | ICD-10-CM | POA: Diagnosis not present

## 2014-03-22 DIAGNOSIS — E1129 Type 2 diabetes mellitus with other diabetic kidney complication: Secondary | ICD-10-CM | POA: Diagnosis not present

## 2014-03-24 DIAGNOSIS — E1129 Type 2 diabetes mellitus with other diabetic kidney complication: Secondary | ICD-10-CM | POA: Diagnosis not present

## 2014-03-24 DIAGNOSIS — N186 End stage renal disease: Secondary | ICD-10-CM | POA: Diagnosis not present

## 2014-03-24 DIAGNOSIS — D638 Anemia in other chronic diseases classified elsewhere: Secondary | ICD-10-CM | POA: Diagnosis not present

## 2014-03-24 DIAGNOSIS — D631 Anemia in chronic kidney disease: Secondary | ICD-10-CM | POA: Diagnosis not present

## 2014-03-24 DIAGNOSIS — N2581 Secondary hyperparathyroidism of renal origin: Secondary | ICD-10-CM | POA: Diagnosis not present

## 2014-03-27 DIAGNOSIS — D631 Anemia in chronic kidney disease: Secondary | ICD-10-CM | POA: Diagnosis not present

## 2014-03-27 DIAGNOSIS — D638 Anemia in other chronic diseases classified elsewhere: Secondary | ICD-10-CM | POA: Diagnosis not present

## 2014-03-27 DIAGNOSIS — E1129 Type 2 diabetes mellitus with other diabetic kidney complication: Secondary | ICD-10-CM | POA: Diagnosis not present

## 2014-03-27 DIAGNOSIS — N2581 Secondary hyperparathyroidism of renal origin: Secondary | ICD-10-CM | POA: Diagnosis not present

## 2014-03-27 DIAGNOSIS — N186 End stage renal disease: Secondary | ICD-10-CM | POA: Diagnosis not present

## 2014-03-29 DIAGNOSIS — N186 End stage renal disease: Secondary | ICD-10-CM | POA: Diagnosis not present

## 2014-03-29 DIAGNOSIS — N2581 Secondary hyperparathyroidism of renal origin: Secondary | ICD-10-CM | POA: Diagnosis not present

## 2014-03-29 DIAGNOSIS — D638 Anemia in other chronic diseases classified elsewhere: Secondary | ICD-10-CM | POA: Diagnosis not present

## 2014-03-29 DIAGNOSIS — E1129 Type 2 diabetes mellitus with other diabetic kidney complication: Secondary | ICD-10-CM | POA: Diagnosis not present

## 2014-03-29 DIAGNOSIS — D631 Anemia in chronic kidney disease: Secondary | ICD-10-CM | POA: Diagnosis not present

## 2014-03-31 DIAGNOSIS — D638 Anemia in other chronic diseases classified elsewhere: Secondary | ICD-10-CM | POA: Diagnosis not present

## 2014-03-31 DIAGNOSIS — N2581 Secondary hyperparathyroidism of renal origin: Secondary | ICD-10-CM | POA: Diagnosis not present

## 2014-03-31 DIAGNOSIS — D631 Anemia in chronic kidney disease: Secondary | ICD-10-CM | POA: Diagnosis not present

## 2014-03-31 DIAGNOSIS — Z992 Dependence on renal dialysis: Secondary | ICD-10-CM | POA: Diagnosis not present

## 2014-03-31 DIAGNOSIS — E1129 Type 2 diabetes mellitus with other diabetic kidney complication: Secondary | ICD-10-CM | POA: Diagnosis not present

## 2014-03-31 DIAGNOSIS — N186 End stage renal disease: Secondary | ICD-10-CM | POA: Diagnosis not present

## 2014-04-03 DIAGNOSIS — Z23 Encounter for immunization: Secondary | ICD-10-CM | POA: Diagnosis not present

## 2014-04-03 DIAGNOSIS — N186 End stage renal disease: Secondary | ICD-10-CM | POA: Diagnosis not present

## 2014-04-03 DIAGNOSIS — E1129 Type 2 diabetes mellitus with other diabetic kidney complication: Secondary | ICD-10-CM | POA: Diagnosis not present

## 2014-04-03 DIAGNOSIS — D631 Anemia in chronic kidney disease: Secondary | ICD-10-CM | POA: Diagnosis not present

## 2014-04-03 DIAGNOSIS — N2581 Secondary hyperparathyroidism of renal origin: Secondary | ICD-10-CM | POA: Diagnosis not present

## 2014-04-03 DIAGNOSIS — D509 Iron deficiency anemia, unspecified: Secondary | ICD-10-CM | POA: Diagnosis not present

## 2014-04-04 DIAGNOSIS — I80202 Phlebitis and thrombophlebitis of unspecified deep vessels of left lower extremity: Secondary | ICD-10-CM | POA: Diagnosis not present

## 2014-04-04 DIAGNOSIS — Z7901 Long term (current) use of anticoagulants: Secondary | ICD-10-CM | POA: Diagnosis not present

## 2014-04-30 DIAGNOSIS — N186 End stage renal disease: Secondary | ICD-10-CM | POA: Diagnosis not present

## 2014-04-30 DIAGNOSIS — Z992 Dependence on renal dialysis: Secondary | ICD-10-CM | POA: Diagnosis not present

## 2014-05-01 DIAGNOSIS — N2581 Secondary hyperparathyroidism of renal origin: Secondary | ICD-10-CM | POA: Diagnosis not present

## 2014-05-01 DIAGNOSIS — E1129 Type 2 diabetes mellitus with other diabetic kidney complication: Secondary | ICD-10-CM | POA: Diagnosis not present

## 2014-05-01 DIAGNOSIS — N186 End stage renal disease: Secondary | ICD-10-CM | POA: Diagnosis not present

## 2014-05-01 DIAGNOSIS — D509 Iron deficiency anemia, unspecified: Secondary | ICD-10-CM | POA: Diagnosis not present

## 2014-05-01 DIAGNOSIS — D631 Anemia in chronic kidney disease: Secondary | ICD-10-CM | POA: Diagnosis not present

## 2014-05-03 DIAGNOSIS — D509 Iron deficiency anemia, unspecified: Secondary | ICD-10-CM | POA: Diagnosis not present

## 2014-05-03 DIAGNOSIS — D631 Anemia in chronic kidney disease: Secondary | ICD-10-CM | POA: Diagnosis not present

## 2014-05-03 DIAGNOSIS — E1129 Type 2 diabetes mellitus with other diabetic kidney complication: Secondary | ICD-10-CM | POA: Diagnosis not present

## 2014-05-03 DIAGNOSIS — N186 End stage renal disease: Secondary | ICD-10-CM | POA: Diagnosis not present

## 2014-05-03 DIAGNOSIS — N2581 Secondary hyperparathyroidism of renal origin: Secondary | ICD-10-CM | POA: Diagnosis not present

## 2014-05-05 DIAGNOSIS — D631 Anemia in chronic kidney disease: Secondary | ICD-10-CM | POA: Diagnosis not present

## 2014-05-05 DIAGNOSIS — N186 End stage renal disease: Secondary | ICD-10-CM | POA: Diagnosis not present

## 2014-05-05 DIAGNOSIS — D509 Iron deficiency anemia, unspecified: Secondary | ICD-10-CM | POA: Diagnosis not present

## 2014-05-05 DIAGNOSIS — E1129 Type 2 diabetes mellitus with other diabetic kidney complication: Secondary | ICD-10-CM | POA: Diagnosis not present

## 2014-05-05 DIAGNOSIS — N2581 Secondary hyperparathyroidism of renal origin: Secondary | ICD-10-CM | POA: Diagnosis not present

## 2014-05-08 DIAGNOSIS — N2581 Secondary hyperparathyroidism of renal origin: Secondary | ICD-10-CM | POA: Diagnosis not present

## 2014-05-08 DIAGNOSIS — E1129 Type 2 diabetes mellitus with other diabetic kidney complication: Secondary | ICD-10-CM | POA: Diagnosis not present

## 2014-05-08 DIAGNOSIS — N186 End stage renal disease: Secondary | ICD-10-CM | POA: Diagnosis not present

## 2014-05-08 DIAGNOSIS — D631 Anemia in chronic kidney disease: Secondary | ICD-10-CM | POA: Diagnosis not present

## 2014-05-08 DIAGNOSIS — D509 Iron deficiency anemia, unspecified: Secondary | ICD-10-CM | POA: Diagnosis not present

## 2014-05-10 DIAGNOSIS — D631 Anemia in chronic kidney disease: Secondary | ICD-10-CM | POA: Diagnosis not present

## 2014-05-10 DIAGNOSIS — D509 Iron deficiency anemia, unspecified: Secondary | ICD-10-CM | POA: Diagnosis not present

## 2014-05-10 DIAGNOSIS — N2581 Secondary hyperparathyroidism of renal origin: Secondary | ICD-10-CM | POA: Diagnosis not present

## 2014-05-10 DIAGNOSIS — E1129 Type 2 diabetes mellitus with other diabetic kidney complication: Secondary | ICD-10-CM | POA: Diagnosis not present

## 2014-05-10 DIAGNOSIS — N186 End stage renal disease: Secondary | ICD-10-CM | POA: Diagnosis not present

## 2014-05-11 DIAGNOSIS — I80202 Phlebitis and thrombophlebitis of unspecified deep vessels of left lower extremity: Secondary | ICD-10-CM | POA: Diagnosis not present

## 2014-05-11 DIAGNOSIS — Z7901 Long term (current) use of anticoagulants: Secondary | ICD-10-CM | POA: Diagnosis not present

## 2014-05-12 DIAGNOSIS — E1129 Type 2 diabetes mellitus with other diabetic kidney complication: Secondary | ICD-10-CM | POA: Diagnosis not present

## 2014-05-12 DIAGNOSIS — D631 Anemia in chronic kidney disease: Secondary | ICD-10-CM | POA: Diagnosis not present

## 2014-05-12 DIAGNOSIS — D509 Iron deficiency anemia, unspecified: Secondary | ICD-10-CM | POA: Diagnosis not present

## 2014-05-12 DIAGNOSIS — N2581 Secondary hyperparathyroidism of renal origin: Secondary | ICD-10-CM | POA: Diagnosis not present

## 2014-05-12 DIAGNOSIS — N186 End stage renal disease: Secondary | ICD-10-CM | POA: Diagnosis not present

## 2014-05-15 DIAGNOSIS — E1129 Type 2 diabetes mellitus with other diabetic kidney complication: Secondary | ICD-10-CM | POA: Diagnosis not present

## 2014-05-15 DIAGNOSIS — D631 Anemia in chronic kidney disease: Secondary | ICD-10-CM | POA: Diagnosis not present

## 2014-05-15 DIAGNOSIS — D509 Iron deficiency anemia, unspecified: Secondary | ICD-10-CM | POA: Diagnosis not present

## 2014-05-15 DIAGNOSIS — N2581 Secondary hyperparathyroidism of renal origin: Secondary | ICD-10-CM | POA: Diagnosis not present

## 2014-05-15 DIAGNOSIS — N186 End stage renal disease: Secondary | ICD-10-CM | POA: Diagnosis not present

## 2014-05-17 DIAGNOSIS — D509 Iron deficiency anemia, unspecified: Secondary | ICD-10-CM | POA: Diagnosis not present

## 2014-05-17 DIAGNOSIS — E1129 Type 2 diabetes mellitus with other diabetic kidney complication: Secondary | ICD-10-CM | POA: Diagnosis not present

## 2014-05-17 DIAGNOSIS — N2581 Secondary hyperparathyroidism of renal origin: Secondary | ICD-10-CM | POA: Diagnosis not present

## 2014-05-17 DIAGNOSIS — N186 End stage renal disease: Secondary | ICD-10-CM | POA: Diagnosis not present

## 2014-05-17 DIAGNOSIS — D631 Anemia in chronic kidney disease: Secondary | ICD-10-CM | POA: Diagnosis not present

## 2014-05-19 DIAGNOSIS — E1129 Type 2 diabetes mellitus with other diabetic kidney complication: Secondary | ICD-10-CM | POA: Diagnosis not present

## 2014-05-19 DIAGNOSIS — N186 End stage renal disease: Secondary | ICD-10-CM | POA: Diagnosis not present

## 2014-05-19 DIAGNOSIS — D509 Iron deficiency anemia, unspecified: Secondary | ICD-10-CM | POA: Diagnosis not present

## 2014-05-19 DIAGNOSIS — D631 Anemia in chronic kidney disease: Secondary | ICD-10-CM | POA: Diagnosis not present

## 2014-05-19 DIAGNOSIS — N2581 Secondary hyperparathyroidism of renal origin: Secondary | ICD-10-CM | POA: Diagnosis not present

## 2014-05-22 DIAGNOSIS — D509 Iron deficiency anemia, unspecified: Secondary | ICD-10-CM | POA: Diagnosis not present

## 2014-05-22 DIAGNOSIS — N2581 Secondary hyperparathyroidism of renal origin: Secondary | ICD-10-CM | POA: Diagnosis not present

## 2014-05-22 DIAGNOSIS — N186 End stage renal disease: Secondary | ICD-10-CM | POA: Diagnosis not present

## 2014-05-22 DIAGNOSIS — D631 Anemia in chronic kidney disease: Secondary | ICD-10-CM | POA: Diagnosis not present

## 2014-05-22 DIAGNOSIS — E1129 Type 2 diabetes mellitus with other diabetic kidney complication: Secondary | ICD-10-CM | POA: Diagnosis not present

## 2014-05-24 DIAGNOSIS — D509 Iron deficiency anemia, unspecified: Secondary | ICD-10-CM | POA: Diagnosis not present

## 2014-05-24 DIAGNOSIS — E1129 Type 2 diabetes mellitus with other diabetic kidney complication: Secondary | ICD-10-CM | POA: Diagnosis not present

## 2014-05-24 DIAGNOSIS — N2581 Secondary hyperparathyroidism of renal origin: Secondary | ICD-10-CM | POA: Diagnosis not present

## 2014-05-24 DIAGNOSIS — N186 End stage renal disease: Secondary | ICD-10-CM | POA: Diagnosis not present

## 2014-05-24 DIAGNOSIS — D631 Anemia in chronic kidney disease: Secondary | ICD-10-CM | POA: Diagnosis not present

## 2014-05-27 DIAGNOSIS — E1129 Type 2 diabetes mellitus with other diabetic kidney complication: Secondary | ICD-10-CM | POA: Diagnosis not present

## 2014-05-27 DIAGNOSIS — D631 Anemia in chronic kidney disease: Secondary | ICD-10-CM | POA: Diagnosis not present

## 2014-05-27 DIAGNOSIS — N186 End stage renal disease: Secondary | ICD-10-CM | POA: Diagnosis not present

## 2014-05-27 DIAGNOSIS — N2581 Secondary hyperparathyroidism of renal origin: Secondary | ICD-10-CM | POA: Diagnosis not present

## 2014-05-27 DIAGNOSIS — D509 Iron deficiency anemia, unspecified: Secondary | ICD-10-CM | POA: Diagnosis not present

## 2014-05-29 DIAGNOSIS — N2581 Secondary hyperparathyroidism of renal origin: Secondary | ICD-10-CM | POA: Diagnosis not present

## 2014-05-29 DIAGNOSIS — N186 End stage renal disease: Secondary | ICD-10-CM | POA: Diagnosis not present

## 2014-05-29 DIAGNOSIS — E1129 Type 2 diabetes mellitus with other diabetic kidney complication: Secondary | ICD-10-CM | POA: Diagnosis not present

## 2014-05-29 DIAGNOSIS — D631 Anemia in chronic kidney disease: Secondary | ICD-10-CM | POA: Diagnosis not present

## 2014-05-29 DIAGNOSIS — D509 Iron deficiency anemia, unspecified: Secondary | ICD-10-CM | POA: Diagnosis not present

## 2014-05-30 ENCOUNTER — Ambulatory Visit (INDEPENDENT_AMBULATORY_CARE_PROVIDER_SITE_OTHER): Payer: Medicare Other | Admitting: Ophthalmology

## 2014-05-30 DIAGNOSIS — E11319 Type 2 diabetes mellitus with unspecified diabetic retinopathy without macular edema: Secondary | ICD-10-CM

## 2014-05-30 DIAGNOSIS — E11359 Type 2 diabetes mellitus with proliferative diabetic retinopathy without macular edema: Secondary | ICD-10-CM

## 2014-05-30 DIAGNOSIS — I10 Essential (primary) hypertension: Secondary | ICD-10-CM | POA: Diagnosis not present

## 2014-05-30 DIAGNOSIS — H35033 Hypertensive retinopathy, bilateral: Secondary | ICD-10-CM | POA: Diagnosis not present

## 2014-05-31 DIAGNOSIS — E1129 Type 2 diabetes mellitus with other diabetic kidney complication: Secondary | ICD-10-CM | POA: Diagnosis not present

## 2014-05-31 DIAGNOSIS — N2581 Secondary hyperparathyroidism of renal origin: Secondary | ICD-10-CM | POA: Diagnosis not present

## 2014-05-31 DIAGNOSIS — D509 Iron deficiency anemia, unspecified: Secondary | ICD-10-CM | POA: Diagnosis not present

## 2014-05-31 DIAGNOSIS — D631 Anemia in chronic kidney disease: Secondary | ICD-10-CM | POA: Diagnosis not present

## 2014-05-31 DIAGNOSIS — Z992 Dependence on renal dialysis: Secondary | ICD-10-CM | POA: Diagnosis not present

## 2014-05-31 DIAGNOSIS — N186 End stage renal disease: Secondary | ICD-10-CM | POA: Diagnosis not present

## 2014-06-03 DIAGNOSIS — D509 Iron deficiency anemia, unspecified: Secondary | ICD-10-CM | POA: Diagnosis not present

## 2014-06-03 DIAGNOSIS — E1129 Type 2 diabetes mellitus with other diabetic kidney complication: Secondary | ICD-10-CM | POA: Diagnosis not present

## 2014-06-03 DIAGNOSIS — N186 End stage renal disease: Secondary | ICD-10-CM | POA: Diagnosis not present

## 2014-06-03 DIAGNOSIS — Z23 Encounter for immunization: Secondary | ICD-10-CM | POA: Diagnosis not present

## 2014-06-03 DIAGNOSIS — D631 Anemia in chronic kidney disease: Secondary | ICD-10-CM | POA: Diagnosis not present

## 2014-06-03 DIAGNOSIS — N2581 Secondary hyperparathyroidism of renal origin: Secondary | ICD-10-CM | POA: Diagnosis not present

## 2014-06-05 DIAGNOSIS — Z23 Encounter for immunization: Secondary | ICD-10-CM | POA: Diagnosis not present

## 2014-06-05 DIAGNOSIS — N186 End stage renal disease: Secondary | ICD-10-CM | POA: Diagnosis not present

## 2014-06-05 DIAGNOSIS — D631 Anemia in chronic kidney disease: Secondary | ICD-10-CM | POA: Diagnosis not present

## 2014-06-05 DIAGNOSIS — E1129 Type 2 diabetes mellitus with other diabetic kidney complication: Secondary | ICD-10-CM | POA: Diagnosis not present

## 2014-06-05 DIAGNOSIS — N2581 Secondary hyperparathyroidism of renal origin: Secondary | ICD-10-CM | POA: Diagnosis not present

## 2014-06-05 DIAGNOSIS — D509 Iron deficiency anemia, unspecified: Secondary | ICD-10-CM | POA: Diagnosis not present

## 2014-06-07 DIAGNOSIS — Z23 Encounter for immunization: Secondary | ICD-10-CM | POA: Diagnosis not present

## 2014-06-07 DIAGNOSIS — N2581 Secondary hyperparathyroidism of renal origin: Secondary | ICD-10-CM | POA: Diagnosis not present

## 2014-06-07 DIAGNOSIS — E1129 Type 2 diabetes mellitus with other diabetic kidney complication: Secondary | ICD-10-CM | POA: Diagnosis not present

## 2014-06-07 DIAGNOSIS — D631 Anemia in chronic kidney disease: Secondary | ICD-10-CM | POA: Diagnosis not present

## 2014-06-07 DIAGNOSIS — D509 Iron deficiency anemia, unspecified: Secondary | ICD-10-CM | POA: Diagnosis not present

## 2014-06-07 DIAGNOSIS — N186 End stage renal disease: Secondary | ICD-10-CM | POA: Diagnosis not present

## 2014-06-09 DIAGNOSIS — N2581 Secondary hyperparathyroidism of renal origin: Secondary | ICD-10-CM | POA: Diagnosis not present

## 2014-06-09 DIAGNOSIS — N186 End stage renal disease: Secondary | ICD-10-CM | POA: Diagnosis not present

## 2014-06-09 DIAGNOSIS — D631 Anemia in chronic kidney disease: Secondary | ICD-10-CM | POA: Diagnosis not present

## 2014-06-09 DIAGNOSIS — Z23 Encounter for immunization: Secondary | ICD-10-CM | POA: Diagnosis not present

## 2014-06-09 DIAGNOSIS — D509 Iron deficiency anemia, unspecified: Secondary | ICD-10-CM | POA: Diagnosis not present

## 2014-06-09 DIAGNOSIS — E1129 Type 2 diabetes mellitus with other diabetic kidney complication: Secondary | ICD-10-CM | POA: Diagnosis not present

## 2014-06-12 DIAGNOSIS — N186 End stage renal disease: Secondary | ICD-10-CM | POA: Diagnosis not present

## 2014-06-12 DIAGNOSIS — Z23 Encounter for immunization: Secondary | ICD-10-CM | POA: Diagnosis not present

## 2014-06-12 DIAGNOSIS — N2581 Secondary hyperparathyroidism of renal origin: Secondary | ICD-10-CM | POA: Diagnosis not present

## 2014-06-12 DIAGNOSIS — D631 Anemia in chronic kidney disease: Secondary | ICD-10-CM | POA: Diagnosis not present

## 2014-06-12 DIAGNOSIS — E1129 Type 2 diabetes mellitus with other diabetic kidney complication: Secondary | ICD-10-CM | POA: Diagnosis not present

## 2014-06-12 DIAGNOSIS — D509 Iron deficiency anemia, unspecified: Secondary | ICD-10-CM | POA: Diagnosis not present

## 2014-06-14 ENCOUNTER — Encounter (HOSPITAL_COMMUNITY): Payer: Self-pay | Admitting: Neurological Surgery

## 2014-06-14 DIAGNOSIS — D631 Anemia in chronic kidney disease: Secondary | ICD-10-CM | POA: Diagnosis not present

## 2014-06-14 DIAGNOSIS — N186 End stage renal disease: Secondary | ICD-10-CM | POA: Diagnosis not present

## 2014-06-14 DIAGNOSIS — N2581 Secondary hyperparathyroidism of renal origin: Secondary | ICD-10-CM | POA: Diagnosis not present

## 2014-06-14 DIAGNOSIS — Z23 Encounter for immunization: Secondary | ICD-10-CM | POA: Diagnosis not present

## 2014-06-14 DIAGNOSIS — E1129 Type 2 diabetes mellitus with other diabetic kidney complication: Secondary | ICD-10-CM | POA: Diagnosis not present

## 2014-06-14 DIAGNOSIS — D509 Iron deficiency anemia, unspecified: Secondary | ICD-10-CM | POA: Diagnosis not present

## 2014-06-16 DIAGNOSIS — E1129 Type 2 diabetes mellitus with other diabetic kidney complication: Secondary | ICD-10-CM | POA: Diagnosis not present

## 2014-06-16 DIAGNOSIS — D631 Anemia in chronic kidney disease: Secondary | ICD-10-CM | POA: Diagnosis not present

## 2014-06-16 DIAGNOSIS — N186 End stage renal disease: Secondary | ICD-10-CM | POA: Diagnosis not present

## 2014-06-16 DIAGNOSIS — N2581 Secondary hyperparathyroidism of renal origin: Secondary | ICD-10-CM | POA: Diagnosis not present

## 2014-06-16 DIAGNOSIS — D509 Iron deficiency anemia, unspecified: Secondary | ICD-10-CM | POA: Diagnosis not present

## 2014-06-16 DIAGNOSIS — Z23 Encounter for immunization: Secondary | ICD-10-CM | POA: Diagnosis not present

## 2014-06-19 DIAGNOSIS — D631 Anemia in chronic kidney disease: Secondary | ICD-10-CM | POA: Diagnosis not present

## 2014-06-19 DIAGNOSIS — Z23 Encounter for immunization: Secondary | ICD-10-CM | POA: Diagnosis not present

## 2014-06-19 DIAGNOSIS — D509 Iron deficiency anemia, unspecified: Secondary | ICD-10-CM | POA: Diagnosis not present

## 2014-06-19 DIAGNOSIS — E1129 Type 2 diabetes mellitus with other diabetic kidney complication: Secondary | ICD-10-CM | POA: Diagnosis not present

## 2014-06-19 DIAGNOSIS — N2581 Secondary hyperparathyroidism of renal origin: Secondary | ICD-10-CM | POA: Diagnosis not present

## 2014-06-19 DIAGNOSIS — N186 End stage renal disease: Secondary | ICD-10-CM | POA: Diagnosis not present

## 2014-06-20 DIAGNOSIS — Z7901 Long term (current) use of anticoagulants: Secondary | ICD-10-CM | POA: Diagnosis not present

## 2014-06-20 DIAGNOSIS — I80202 Phlebitis and thrombophlebitis of unspecified deep vessels of left lower extremity: Secondary | ICD-10-CM | POA: Diagnosis not present

## 2014-06-21 DIAGNOSIS — Z23 Encounter for immunization: Secondary | ICD-10-CM | POA: Diagnosis not present

## 2014-06-21 DIAGNOSIS — N186 End stage renal disease: Secondary | ICD-10-CM | POA: Diagnosis not present

## 2014-06-21 DIAGNOSIS — D631 Anemia in chronic kidney disease: Secondary | ICD-10-CM | POA: Diagnosis not present

## 2014-06-21 DIAGNOSIS — E1129 Type 2 diabetes mellitus with other diabetic kidney complication: Secondary | ICD-10-CM | POA: Diagnosis not present

## 2014-06-21 DIAGNOSIS — D509 Iron deficiency anemia, unspecified: Secondary | ICD-10-CM | POA: Diagnosis not present

## 2014-06-21 DIAGNOSIS — N2581 Secondary hyperparathyroidism of renal origin: Secondary | ICD-10-CM | POA: Diagnosis not present

## 2014-06-25 DIAGNOSIS — N2581 Secondary hyperparathyroidism of renal origin: Secondary | ICD-10-CM | POA: Diagnosis not present

## 2014-06-25 DIAGNOSIS — D631 Anemia in chronic kidney disease: Secondary | ICD-10-CM | POA: Diagnosis not present

## 2014-06-25 DIAGNOSIS — D509 Iron deficiency anemia, unspecified: Secondary | ICD-10-CM | POA: Diagnosis not present

## 2014-06-25 DIAGNOSIS — N186 End stage renal disease: Secondary | ICD-10-CM | POA: Diagnosis not present

## 2014-06-25 DIAGNOSIS — E1129 Type 2 diabetes mellitus with other diabetic kidney complication: Secondary | ICD-10-CM | POA: Diagnosis not present

## 2014-06-25 DIAGNOSIS — Z23 Encounter for immunization: Secondary | ICD-10-CM | POA: Diagnosis not present

## 2014-06-26 DIAGNOSIS — D509 Iron deficiency anemia, unspecified: Secondary | ICD-10-CM | POA: Diagnosis not present

## 2014-06-26 DIAGNOSIS — N2581 Secondary hyperparathyroidism of renal origin: Secondary | ICD-10-CM | POA: Diagnosis not present

## 2014-06-26 DIAGNOSIS — N186 End stage renal disease: Secondary | ICD-10-CM | POA: Diagnosis not present

## 2014-06-26 DIAGNOSIS — Z23 Encounter for immunization: Secondary | ICD-10-CM | POA: Diagnosis not present

## 2014-06-26 DIAGNOSIS — D631 Anemia in chronic kidney disease: Secondary | ICD-10-CM | POA: Diagnosis not present

## 2014-06-26 DIAGNOSIS — E1129 Type 2 diabetes mellitus with other diabetic kidney complication: Secondary | ICD-10-CM | POA: Diagnosis not present

## 2014-06-28 DIAGNOSIS — N186 End stage renal disease: Secondary | ICD-10-CM | POA: Diagnosis not present

## 2014-06-28 DIAGNOSIS — D509 Iron deficiency anemia, unspecified: Secondary | ICD-10-CM | POA: Diagnosis not present

## 2014-06-28 DIAGNOSIS — D631 Anemia in chronic kidney disease: Secondary | ICD-10-CM | POA: Diagnosis not present

## 2014-06-28 DIAGNOSIS — E1129 Type 2 diabetes mellitus with other diabetic kidney complication: Secondary | ICD-10-CM | POA: Diagnosis not present

## 2014-06-28 DIAGNOSIS — Z23 Encounter for immunization: Secondary | ICD-10-CM | POA: Diagnosis not present

## 2014-06-28 DIAGNOSIS — N2581 Secondary hyperparathyroidism of renal origin: Secondary | ICD-10-CM | POA: Diagnosis not present

## 2014-06-30 DIAGNOSIS — Z23 Encounter for immunization: Secondary | ICD-10-CM | POA: Diagnosis not present

## 2014-06-30 DIAGNOSIS — D509 Iron deficiency anemia, unspecified: Secondary | ICD-10-CM | POA: Diagnosis not present

## 2014-06-30 DIAGNOSIS — N2581 Secondary hyperparathyroidism of renal origin: Secondary | ICD-10-CM | POA: Diagnosis not present

## 2014-06-30 DIAGNOSIS — N186 End stage renal disease: Secondary | ICD-10-CM | POA: Diagnosis not present

## 2014-06-30 DIAGNOSIS — D631 Anemia in chronic kidney disease: Secondary | ICD-10-CM | POA: Diagnosis not present

## 2014-06-30 DIAGNOSIS — E1129 Type 2 diabetes mellitus with other diabetic kidney complication: Secondary | ICD-10-CM | POA: Diagnosis not present

## 2014-07-01 DIAGNOSIS — Z992 Dependence on renal dialysis: Secondary | ICD-10-CM | POA: Diagnosis not present

## 2014-07-01 DIAGNOSIS — N186 End stage renal disease: Secondary | ICD-10-CM | POA: Diagnosis not present

## 2014-07-03 DIAGNOSIS — Z23 Encounter for immunization: Secondary | ICD-10-CM | POA: Diagnosis not present

## 2014-07-03 DIAGNOSIS — D631 Anemia in chronic kidney disease: Secondary | ICD-10-CM | POA: Diagnosis not present

## 2014-07-03 DIAGNOSIS — D638 Anemia in other chronic diseases classified elsewhere: Secondary | ICD-10-CM | POA: Diagnosis not present

## 2014-07-03 DIAGNOSIS — N186 End stage renal disease: Secondary | ICD-10-CM | POA: Diagnosis not present

## 2014-07-03 DIAGNOSIS — D509 Iron deficiency anemia, unspecified: Secondary | ICD-10-CM | POA: Diagnosis not present

## 2014-07-03 DIAGNOSIS — N2581 Secondary hyperparathyroidism of renal origin: Secondary | ICD-10-CM | POA: Diagnosis not present

## 2014-07-03 DIAGNOSIS — E1129 Type 2 diabetes mellitus with other diabetic kidney complication: Secondary | ICD-10-CM | POA: Diagnosis not present

## 2014-07-05 DIAGNOSIS — N2581 Secondary hyperparathyroidism of renal origin: Secondary | ICD-10-CM | POA: Diagnosis not present

## 2014-07-05 DIAGNOSIS — D509 Iron deficiency anemia, unspecified: Secondary | ICD-10-CM | POA: Diagnosis not present

## 2014-07-05 DIAGNOSIS — D631 Anemia in chronic kidney disease: Secondary | ICD-10-CM | POA: Diagnosis not present

## 2014-07-05 DIAGNOSIS — Z23 Encounter for immunization: Secondary | ICD-10-CM | POA: Diagnosis not present

## 2014-07-05 DIAGNOSIS — N186 End stage renal disease: Secondary | ICD-10-CM | POA: Diagnosis not present

## 2014-07-05 DIAGNOSIS — E1129 Type 2 diabetes mellitus with other diabetic kidney complication: Secondary | ICD-10-CM | POA: Diagnosis not present

## 2014-07-07 DIAGNOSIS — D509 Iron deficiency anemia, unspecified: Secondary | ICD-10-CM | POA: Diagnosis not present

## 2014-07-07 DIAGNOSIS — N186 End stage renal disease: Secondary | ICD-10-CM | POA: Diagnosis not present

## 2014-07-07 DIAGNOSIS — N2581 Secondary hyperparathyroidism of renal origin: Secondary | ICD-10-CM | POA: Diagnosis not present

## 2014-07-07 DIAGNOSIS — Z23 Encounter for immunization: Secondary | ICD-10-CM | POA: Diagnosis not present

## 2014-07-07 DIAGNOSIS — E1129 Type 2 diabetes mellitus with other diabetic kidney complication: Secondary | ICD-10-CM | POA: Diagnosis not present

## 2014-07-07 DIAGNOSIS — D631 Anemia in chronic kidney disease: Secondary | ICD-10-CM | POA: Diagnosis not present

## 2014-07-10 DIAGNOSIS — D631 Anemia in chronic kidney disease: Secondary | ICD-10-CM | POA: Diagnosis not present

## 2014-07-10 DIAGNOSIS — Z23 Encounter for immunization: Secondary | ICD-10-CM | POA: Diagnosis not present

## 2014-07-10 DIAGNOSIS — N2581 Secondary hyperparathyroidism of renal origin: Secondary | ICD-10-CM | POA: Diagnosis not present

## 2014-07-10 DIAGNOSIS — E1129 Type 2 diabetes mellitus with other diabetic kidney complication: Secondary | ICD-10-CM | POA: Diagnosis not present

## 2014-07-10 DIAGNOSIS — N186 End stage renal disease: Secondary | ICD-10-CM | POA: Diagnosis not present

## 2014-07-10 DIAGNOSIS — D509 Iron deficiency anemia, unspecified: Secondary | ICD-10-CM | POA: Diagnosis not present

## 2014-07-12 DIAGNOSIS — D509 Iron deficiency anemia, unspecified: Secondary | ICD-10-CM | POA: Diagnosis not present

## 2014-07-12 DIAGNOSIS — E1129 Type 2 diabetes mellitus with other diabetic kidney complication: Secondary | ICD-10-CM | POA: Diagnosis not present

## 2014-07-12 DIAGNOSIS — N2581 Secondary hyperparathyroidism of renal origin: Secondary | ICD-10-CM | POA: Diagnosis not present

## 2014-07-12 DIAGNOSIS — D631 Anemia in chronic kidney disease: Secondary | ICD-10-CM | POA: Diagnosis not present

## 2014-07-12 DIAGNOSIS — Z23 Encounter for immunization: Secondary | ICD-10-CM | POA: Diagnosis not present

## 2014-07-12 DIAGNOSIS — N186 End stage renal disease: Secondary | ICD-10-CM | POA: Diagnosis not present

## 2014-07-14 DIAGNOSIS — D631 Anemia in chronic kidney disease: Secondary | ICD-10-CM | POA: Diagnosis not present

## 2014-07-14 DIAGNOSIS — Z23 Encounter for immunization: Secondary | ICD-10-CM | POA: Diagnosis not present

## 2014-07-14 DIAGNOSIS — N186 End stage renal disease: Secondary | ICD-10-CM | POA: Diagnosis not present

## 2014-07-14 DIAGNOSIS — N2581 Secondary hyperparathyroidism of renal origin: Secondary | ICD-10-CM | POA: Diagnosis not present

## 2014-07-14 DIAGNOSIS — D509 Iron deficiency anemia, unspecified: Secondary | ICD-10-CM | POA: Diagnosis not present

## 2014-07-14 DIAGNOSIS — E1129 Type 2 diabetes mellitus with other diabetic kidney complication: Secondary | ICD-10-CM | POA: Diagnosis not present

## 2014-07-17 DIAGNOSIS — E1129 Type 2 diabetes mellitus with other diabetic kidney complication: Secondary | ICD-10-CM | POA: Diagnosis not present

## 2014-07-17 DIAGNOSIS — D631 Anemia in chronic kidney disease: Secondary | ICD-10-CM | POA: Diagnosis not present

## 2014-07-17 DIAGNOSIS — N2581 Secondary hyperparathyroidism of renal origin: Secondary | ICD-10-CM | POA: Diagnosis not present

## 2014-07-17 DIAGNOSIS — Z23 Encounter for immunization: Secondary | ICD-10-CM | POA: Diagnosis not present

## 2014-07-17 DIAGNOSIS — N186 End stage renal disease: Secondary | ICD-10-CM | POA: Diagnosis not present

## 2014-07-17 DIAGNOSIS — D509 Iron deficiency anemia, unspecified: Secondary | ICD-10-CM | POA: Diagnosis not present

## 2014-07-19 DIAGNOSIS — Z23 Encounter for immunization: Secondary | ICD-10-CM | POA: Diagnosis not present

## 2014-07-19 DIAGNOSIS — N2581 Secondary hyperparathyroidism of renal origin: Secondary | ICD-10-CM | POA: Diagnosis not present

## 2014-07-19 DIAGNOSIS — E1129 Type 2 diabetes mellitus with other diabetic kidney complication: Secondary | ICD-10-CM | POA: Diagnosis not present

## 2014-07-19 DIAGNOSIS — D631 Anemia in chronic kidney disease: Secondary | ICD-10-CM | POA: Diagnosis not present

## 2014-07-19 DIAGNOSIS — D509 Iron deficiency anemia, unspecified: Secondary | ICD-10-CM | POA: Diagnosis not present

## 2014-07-19 DIAGNOSIS — N186 End stage renal disease: Secondary | ICD-10-CM | POA: Diagnosis not present

## 2014-07-21 DIAGNOSIS — D509 Iron deficiency anemia, unspecified: Secondary | ICD-10-CM | POA: Diagnosis not present

## 2014-07-21 DIAGNOSIS — Z23 Encounter for immunization: Secondary | ICD-10-CM | POA: Diagnosis not present

## 2014-07-21 DIAGNOSIS — D631 Anemia in chronic kidney disease: Secondary | ICD-10-CM | POA: Diagnosis not present

## 2014-07-21 DIAGNOSIS — N186 End stage renal disease: Secondary | ICD-10-CM | POA: Diagnosis not present

## 2014-07-21 DIAGNOSIS — E1129 Type 2 diabetes mellitus with other diabetic kidney complication: Secondary | ICD-10-CM | POA: Diagnosis not present

## 2014-07-21 DIAGNOSIS — N2581 Secondary hyperparathyroidism of renal origin: Secondary | ICD-10-CM | POA: Diagnosis not present

## 2014-07-24 DIAGNOSIS — I80202 Phlebitis and thrombophlebitis of unspecified deep vessels of left lower extremity: Secondary | ICD-10-CM | POA: Diagnosis not present

## 2014-07-24 DIAGNOSIS — D631 Anemia in chronic kidney disease: Secondary | ICD-10-CM | POA: Diagnosis not present

## 2014-07-24 DIAGNOSIS — E1129 Type 2 diabetes mellitus with other diabetic kidney complication: Secondary | ICD-10-CM | POA: Diagnosis not present

## 2014-07-24 DIAGNOSIS — Z7901 Long term (current) use of anticoagulants: Secondary | ICD-10-CM | POA: Diagnosis not present

## 2014-07-24 DIAGNOSIS — Z23 Encounter for immunization: Secondary | ICD-10-CM | POA: Diagnosis not present

## 2014-07-24 DIAGNOSIS — D509 Iron deficiency anemia, unspecified: Secondary | ICD-10-CM | POA: Diagnosis not present

## 2014-07-24 DIAGNOSIS — N186 End stage renal disease: Secondary | ICD-10-CM | POA: Diagnosis not present

## 2014-07-24 DIAGNOSIS — N2581 Secondary hyperparathyroidism of renal origin: Secondary | ICD-10-CM | POA: Diagnosis not present

## 2014-07-26 DIAGNOSIS — N186 End stage renal disease: Secondary | ICD-10-CM | POA: Diagnosis not present

## 2014-07-26 DIAGNOSIS — N2581 Secondary hyperparathyroidism of renal origin: Secondary | ICD-10-CM | POA: Diagnosis not present

## 2014-07-26 DIAGNOSIS — E1129 Type 2 diabetes mellitus with other diabetic kidney complication: Secondary | ICD-10-CM | POA: Diagnosis not present

## 2014-07-26 DIAGNOSIS — D631 Anemia in chronic kidney disease: Secondary | ICD-10-CM | POA: Diagnosis not present

## 2014-07-26 DIAGNOSIS — Z23 Encounter for immunization: Secondary | ICD-10-CM | POA: Diagnosis not present

## 2014-07-26 DIAGNOSIS — D509 Iron deficiency anemia, unspecified: Secondary | ICD-10-CM | POA: Diagnosis not present

## 2014-07-28 DIAGNOSIS — N186 End stage renal disease: Secondary | ICD-10-CM | POA: Diagnosis not present

## 2014-07-28 DIAGNOSIS — D509 Iron deficiency anemia, unspecified: Secondary | ICD-10-CM | POA: Diagnosis not present

## 2014-07-28 DIAGNOSIS — E1129 Type 2 diabetes mellitus with other diabetic kidney complication: Secondary | ICD-10-CM | POA: Diagnosis not present

## 2014-07-28 DIAGNOSIS — D631 Anemia in chronic kidney disease: Secondary | ICD-10-CM | POA: Diagnosis not present

## 2014-07-28 DIAGNOSIS — Z23 Encounter for immunization: Secondary | ICD-10-CM | POA: Diagnosis not present

## 2014-07-28 DIAGNOSIS — N2581 Secondary hyperparathyroidism of renal origin: Secondary | ICD-10-CM | POA: Diagnosis not present

## 2014-07-30 DIAGNOSIS — Z992 Dependence on renal dialysis: Secondary | ICD-10-CM | POA: Diagnosis not present

## 2014-07-30 DIAGNOSIS — N186 End stage renal disease: Secondary | ICD-10-CM | POA: Diagnosis not present

## 2014-07-31 DIAGNOSIS — N2581 Secondary hyperparathyroidism of renal origin: Secondary | ICD-10-CM | POA: Diagnosis not present

## 2014-07-31 DIAGNOSIS — N186 End stage renal disease: Secondary | ICD-10-CM | POA: Diagnosis not present

## 2014-07-31 DIAGNOSIS — E1129 Type 2 diabetes mellitus with other diabetic kidney complication: Secondary | ICD-10-CM | POA: Diagnosis not present

## 2014-07-31 DIAGNOSIS — D638 Anemia in other chronic diseases classified elsewhere: Secondary | ICD-10-CM | POA: Diagnosis not present

## 2014-07-31 DIAGNOSIS — D631 Anemia in chronic kidney disease: Secondary | ICD-10-CM | POA: Diagnosis not present

## 2014-07-31 DIAGNOSIS — Z23 Encounter for immunization: Secondary | ICD-10-CM | POA: Diagnosis not present

## 2014-08-02 DIAGNOSIS — N2581 Secondary hyperparathyroidism of renal origin: Secondary | ICD-10-CM | POA: Diagnosis not present

## 2014-08-02 DIAGNOSIS — E1129 Type 2 diabetes mellitus with other diabetic kidney complication: Secondary | ICD-10-CM | POA: Diagnosis not present

## 2014-08-02 DIAGNOSIS — Z23 Encounter for immunization: Secondary | ICD-10-CM | POA: Diagnosis not present

## 2014-08-02 DIAGNOSIS — N186 End stage renal disease: Secondary | ICD-10-CM | POA: Diagnosis not present

## 2014-08-02 DIAGNOSIS — D638 Anemia in other chronic diseases classified elsewhere: Secondary | ICD-10-CM | POA: Diagnosis not present

## 2014-08-02 DIAGNOSIS — D631 Anemia in chronic kidney disease: Secondary | ICD-10-CM | POA: Diagnosis not present

## 2014-08-04 DIAGNOSIS — E1129 Type 2 diabetes mellitus with other diabetic kidney complication: Secondary | ICD-10-CM | POA: Diagnosis not present

## 2014-08-04 DIAGNOSIS — D631 Anemia in chronic kidney disease: Secondary | ICD-10-CM | POA: Diagnosis not present

## 2014-08-04 DIAGNOSIS — N2581 Secondary hyperparathyroidism of renal origin: Secondary | ICD-10-CM | POA: Diagnosis not present

## 2014-08-04 DIAGNOSIS — D638 Anemia in other chronic diseases classified elsewhere: Secondary | ICD-10-CM | POA: Diagnosis not present

## 2014-08-04 DIAGNOSIS — N186 End stage renal disease: Secondary | ICD-10-CM | POA: Diagnosis not present

## 2014-08-04 DIAGNOSIS — Z23 Encounter for immunization: Secondary | ICD-10-CM | POA: Diagnosis not present

## 2014-08-07 DIAGNOSIS — E1129 Type 2 diabetes mellitus with other diabetic kidney complication: Secondary | ICD-10-CM | POA: Diagnosis not present

## 2014-08-07 DIAGNOSIS — Z23 Encounter for immunization: Secondary | ICD-10-CM | POA: Diagnosis not present

## 2014-08-07 DIAGNOSIS — N2581 Secondary hyperparathyroidism of renal origin: Secondary | ICD-10-CM | POA: Diagnosis not present

## 2014-08-07 DIAGNOSIS — D631 Anemia in chronic kidney disease: Secondary | ICD-10-CM | POA: Diagnosis not present

## 2014-08-07 DIAGNOSIS — N186 End stage renal disease: Secondary | ICD-10-CM | POA: Diagnosis not present

## 2014-08-07 DIAGNOSIS — D638 Anemia in other chronic diseases classified elsewhere: Secondary | ICD-10-CM | POA: Diagnosis not present

## 2014-08-08 DIAGNOSIS — E1139 Type 2 diabetes mellitus with other diabetic ophthalmic complication: Secondary | ICD-10-CM | POA: Diagnosis not present

## 2014-08-08 DIAGNOSIS — I80202 Phlebitis and thrombophlebitis of unspecified deep vessels of left lower extremity: Secondary | ICD-10-CM | POA: Diagnosis not present

## 2014-08-08 DIAGNOSIS — N186 End stage renal disease: Secondary | ICD-10-CM | POA: Diagnosis not present

## 2014-08-08 DIAGNOSIS — E669 Obesity, unspecified: Secondary | ICD-10-CM | POA: Diagnosis not present

## 2014-08-08 DIAGNOSIS — G629 Polyneuropathy, unspecified: Secondary | ICD-10-CM | POA: Diagnosis not present

## 2014-08-08 DIAGNOSIS — E1140 Type 2 diabetes mellitus with diabetic neuropathy, unspecified: Secondary | ICD-10-CM | POA: Diagnosis not present

## 2014-08-08 DIAGNOSIS — E785 Hyperlipidemia, unspecified: Secondary | ICD-10-CM | POA: Diagnosis not present

## 2014-08-08 DIAGNOSIS — Z7901 Long term (current) use of anticoagulants: Secondary | ICD-10-CM | POA: Diagnosis not present

## 2014-08-08 DIAGNOSIS — I10 Essential (primary) hypertension: Secondary | ICD-10-CM | POA: Diagnosis not present

## 2014-08-08 DIAGNOSIS — Z1389 Encounter for screening for other disorder: Secondary | ICD-10-CM | POA: Diagnosis not present

## 2014-08-08 DIAGNOSIS — Z6835 Body mass index (BMI) 35.0-35.9, adult: Secondary | ICD-10-CM | POA: Diagnosis not present

## 2014-08-08 DIAGNOSIS — E1129 Type 2 diabetes mellitus with other diabetic kidney complication: Secondary | ICD-10-CM | POA: Diagnosis not present

## 2014-08-09 DIAGNOSIS — D638 Anemia in other chronic diseases classified elsewhere: Secondary | ICD-10-CM | POA: Diagnosis not present

## 2014-08-09 DIAGNOSIS — D631 Anemia in chronic kidney disease: Secondary | ICD-10-CM | POA: Diagnosis not present

## 2014-08-09 DIAGNOSIS — Z23 Encounter for immunization: Secondary | ICD-10-CM | POA: Diagnosis not present

## 2014-08-09 DIAGNOSIS — N186 End stage renal disease: Secondary | ICD-10-CM | POA: Diagnosis not present

## 2014-08-09 DIAGNOSIS — N2581 Secondary hyperparathyroidism of renal origin: Secondary | ICD-10-CM | POA: Diagnosis not present

## 2014-08-09 DIAGNOSIS — E1129 Type 2 diabetes mellitus with other diabetic kidney complication: Secondary | ICD-10-CM | POA: Diagnosis not present

## 2014-08-10 DIAGNOSIS — N186 End stage renal disease: Secondary | ICD-10-CM | POA: Diagnosis not present

## 2014-08-10 DIAGNOSIS — Z992 Dependence on renal dialysis: Secondary | ICD-10-CM | POA: Diagnosis not present

## 2014-08-10 DIAGNOSIS — I871 Compression of vein: Secondary | ICD-10-CM | POA: Diagnosis not present

## 2014-08-10 DIAGNOSIS — T82858D Stenosis of vascular prosthetic devices, implants and grafts, subsequent encounter: Secondary | ICD-10-CM | POA: Diagnosis not present

## 2014-08-11 DIAGNOSIS — N2581 Secondary hyperparathyroidism of renal origin: Secondary | ICD-10-CM | POA: Diagnosis not present

## 2014-08-11 DIAGNOSIS — E1129 Type 2 diabetes mellitus with other diabetic kidney complication: Secondary | ICD-10-CM | POA: Diagnosis not present

## 2014-08-11 DIAGNOSIS — D631 Anemia in chronic kidney disease: Secondary | ICD-10-CM | POA: Diagnosis not present

## 2014-08-11 DIAGNOSIS — Z23 Encounter for immunization: Secondary | ICD-10-CM | POA: Diagnosis not present

## 2014-08-11 DIAGNOSIS — D638 Anemia in other chronic diseases classified elsewhere: Secondary | ICD-10-CM | POA: Diagnosis not present

## 2014-08-11 DIAGNOSIS — N186 End stage renal disease: Secondary | ICD-10-CM | POA: Diagnosis not present

## 2014-08-14 DIAGNOSIS — Z23 Encounter for immunization: Secondary | ICD-10-CM | POA: Diagnosis not present

## 2014-08-14 DIAGNOSIS — E1129 Type 2 diabetes mellitus with other diabetic kidney complication: Secondary | ICD-10-CM | POA: Diagnosis not present

## 2014-08-14 DIAGNOSIS — D638 Anemia in other chronic diseases classified elsewhere: Secondary | ICD-10-CM | POA: Diagnosis not present

## 2014-08-14 DIAGNOSIS — N2581 Secondary hyperparathyroidism of renal origin: Secondary | ICD-10-CM | POA: Diagnosis not present

## 2014-08-14 DIAGNOSIS — D631 Anemia in chronic kidney disease: Secondary | ICD-10-CM | POA: Diagnosis not present

## 2014-08-14 DIAGNOSIS — N186 End stage renal disease: Secondary | ICD-10-CM | POA: Diagnosis not present

## 2014-08-16 DIAGNOSIS — D631 Anemia in chronic kidney disease: Secondary | ICD-10-CM | POA: Diagnosis not present

## 2014-08-16 DIAGNOSIS — N2581 Secondary hyperparathyroidism of renal origin: Secondary | ICD-10-CM | POA: Diagnosis not present

## 2014-08-16 DIAGNOSIS — N186 End stage renal disease: Secondary | ICD-10-CM | POA: Diagnosis not present

## 2014-08-16 DIAGNOSIS — E1129 Type 2 diabetes mellitus with other diabetic kidney complication: Secondary | ICD-10-CM | POA: Diagnosis not present

## 2014-08-16 DIAGNOSIS — D638 Anemia in other chronic diseases classified elsewhere: Secondary | ICD-10-CM | POA: Diagnosis not present

## 2014-08-16 DIAGNOSIS — Z23 Encounter for immunization: Secondary | ICD-10-CM | POA: Diagnosis not present

## 2014-08-18 DIAGNOSIS — D631 Anemia in chronic kidney disease: Secondary | ICD-10-CM | POA: Diagnosis not present

## 2014-08-18 DIAGNOSIS — Z23 Encounter for immunization: Secondary | ICD-10-CM | POA: Diagnosis not present

## 2014-08-18 DIAGNOSIS — E1129 Type 2 diabetes mellitus with other diabetic kidney complication: Secondary | ICD-10-CM | POA: Diagnosis not present

## 2014-08-18 DIAGNOSIS — N2581 Secondary hyperparathyroidism of renal origin: Secondary | ICD-10-CM | POA: Diagnosis not present

## 2014-08-18 DIAGNOSIS — D638 Anemia in other chronic diseases classified elsewhere: Secondary | ICD-10-CM | POA: Diagnosis not present

## 2014-08-18 DIAGNOSIS — N186 End stage renal disease: Secondary | ICD-10-CM | POA: Diagnosis not present

## 2014-08-21 DIAGNOSIS — Z23 Encounter for immunization: Secondary | ICD-10-CM | POA: Diagnosis not present

## 2014-08-21 DIAGNOSIS — N2581 Secondary hyperparathyroidism of renal origin: Secondary | ICD-10-CM | POA: Diagnosis not present

## 2014-08-21 DIAGNOSIS — D638 Anemia in other chronic diseases classified elsewhere: Secondary | ICD-10-CM | POA: Diagnosis not present

## 2014-08-21 DIAGNOSIS — E1129 Type 2 diabetes mellitus with other diabetic kidney complication: Secondary | ICD-10-CM | POA: Diagnosis not present

## 2014-08-21 DIAGNOSIS — D631 Anemia in chronic kidney disease: Secondary | ICD-10-CM | POA: Diagnosis not present

## 2014-08-21 DIAGNOSIS — N186 End stage renal disease: Secondary | ICD-10-CM | POA: Diagnosis not present

## 2014-08-22 DIAGNOSIS — Z7901 Long term (current) use of anticoagulants: Secondary | ICD-10-CM | POA: Diagnosis not present

## 2014-08-22 DIAGNOSIS — I80202 Phlebitis and thrombophlebitis of unspecified deep vessels of left lower extremity: Secondary | ICD-10-CM | POA: Diagnosis not present

## 2014-08-23 DIAGNOSIS — E1129 Type 2 diabetes mellitus with other diabetic kidney complication: Secondary | ICD-10-CM | POA: Diagnosis not present

## 2014-08-23 DIAGNOSIS — D638 Anemia in other chronic diseases classified elsewhere: Secondary | ICD-10-CM | POA: Diagnosis not present

## 2014-08-23 DIAGNOSIS — N2581 Secondary hyperparathyroidism of renal origin: Secondary | ICD-10-CM | POA: Diagnosis not present

## 2014-08-23 DIAGNOSIS — N186 End stage renal disease: Secondary | ICD-10-CM | POA: Diagnosis not present

## 2014-08-23 DIAGNOSIS — Z23 Encounter for immunization: Secondary | ICD-10-CM | POA: Diagnosis not present

## 2014-08-23 DIAGNOSIS — D631 Anemia in chronic kidney disease: Secondary | ICD-10-CM | POA: Diagnosis not present

## 2014-08-25 DIAGNOSIS — N2581 Secondary hyperparathyroidism of renal origin: Secondary | ICD-10-CM | POA: Diagnosis not present

## 2014-08-25 DIAGNOSIS — D631 Anemia in chronic kidney disease: Secondary | ICD-10-CM | POA: Diagnosis not present

## 2014-08-25 DIAGNOSIS — D638 Anemia in other chronic diseases classified elsewhere: Secondary | ICD-10-CM | POA: Diagnosis not present

## 2014-08-25 DIAGNOSIS — Z23 Encounter for immunization: Secondary | ICD-10-CM | POA: Diagnosis not present

## 2014-08-25 DIAGNOSIS — N186 End stage renal disease: Secondary | ICD-10-CM | POA: Diagnosis not present

## 2014-08-25 DIAGNOSIS — E1129 Type 2 diabetes mellitus with other diabetic kidney complication: Secondary | ICD-10-CM | POA: Diagnosis not present

## 2014-08-28 DIAGNOSIS — E1129 Type 2 diabetes mellitus with other diabetic kidney complication: Secondary | ICD-10-CM | POA: Diagnosis not present

## 2014-08-28 DIAGNOSIS — N186 End stage renal disease: Secondary | ICD-10-CM | POA: Diagnosis not present

## 2014-08-28 DIAGNOSIS — D638 Anemia in other chronic diseases classified elsewhere: Secondary | ICD-10-CM | POA: Diagnosis not present

## 2014-08-28 DIAGNOSIS — Z23 Encounter for immunization: Secondary | ICD-10-CM | POA: Diagnosis not present

## 2014-08-28 DIAGNOSIS — D631 Anemia in chronic kidney disease: Secondary | ICD-10-CM | POA: Diagnosis not present

## 2014-08-28 DIAGNOSIS — N2581 Secondary hyperparathyroidism of renal origin: Secondary | ICD-10-CM | POA: Diagnosis not present

## 2014-08-30 DIAGNOSIS — Z992 Dependence on renal dialysis: Secondary | ICD-10-CM | POA: Diagnosis not present

## 2014-08-30 DIAGNOSIS — E1129 Type 2 diabetes mellitus with other diabetic kidney complication: Secondary | ICD-10-CM | POA: Diagnosis not present

## 2014-08-30 DIAGNOSIS — N186 End stage renal disease: Secondary | ICD-10-CM | POA: Diagnosis not present

## 2014-08-30 DIAGNOSIS — N2581 Secondary hyperparathyroidism of renal origin: Secondary | ICD-10-CM | POA: Diagnosis not present

## 2014-08-30 DIAGNOSIS — D631 Anemia in chronic kidney disease: Secondary | ICD-10-CM | POA: Diagnosis not present

## 2014-08-30 DIAGNOSIS — D638 Anemia in other chronic diseases classified elsewhere: Secondary | ICD-10-CM | POA: Diagnosis not present

## 2014-08-30 DIAGNOSIS — Z23 Encounter for immunization: Secondary | ICD-10-CM | POA: Diagnosis not present

## 2014-09-01 DIAGNOSIS — N2581 Secondary hyperparathyroidism of renal origin: Secondary | ICD-10-CM | POA: Diagnosis not present

## 2014-09-01 DIAGNOSIS — N186 End stage renal disease: Secondary | ICD-10-CM | POA: Diagnosis not present

## 2014-09-01 DIAGNOSIS — D631 Anemia in chronic kidney disease: Secondary | ICD-10-CM | POA: Diagnosis not present

## 2014-09-01 DIAGNOSIS — D638 Anemia in other chronic diseases classified elsewhere: Secondary | ICD-10-CM | POA: Diagnosis not present

## 2014-09-04 DIAGNOSIS — N2581 Secondary hyperparathyroidism of renal origin: Secondary | ICD-10-CM | POA: Diagnosis not present

## 2014-09-04 DIAGNOSIS — D631 Anemia in chronic kidney disease: Secondary | ICD-10-CM | POA: Diagnosis not present

## 2014-09-04 DIAGNOSIS — D638 Anemia in other chronic diseases classified elsewhere: Secondary | ICD-10-CM | POA: Diagnosis not present

## 2014-09-04 DIAGNOSIS — N186 End stage renal disease: Secondary | ICD-10-CM | POA: Diagnosis not present

## 2014-09-06 DIAGNOSIS — D631 Anemia in chronic kidney disease: Secondary | ICD-10-CM | POA: Diagnosis not present

## 2014-09-06 DIAGNOSIS — D638 Anemia in other chronic diseases classified elsewhere: Secondary | ICD-10-CM | POA: Diagnosis not present

## 2014-09-06 DIAGNOSIS — N186 End stage renal disease: Secondary | ICD-10-CM | POA: Diagnosis not present

## 2014-09-06 DIAGNOSIS — N2581 Secondary hyperparathyroidism of renal origin: Secondary | ICD-10-CM | POA: Diagnosis not present

## 2014-09-08 DIAGNOSIS — N186 End stage renal disease: Secondary | ICD-10-CM | POA: Diagnosis not present

## 2014-09-08 DIAGNOSIS — N2581 Secondary hyperparathyroidism of renal origin: Secondary | ICD-10-CM | POA: Diagnosis not present

## 2014-09-08 DIAGNOSIS — D631 Anemia in chronic kidney disease: Secondary | ICD-10-CM | POA: Diagnosis not present

## 2014-09-08 DIAGNOSIS — D638 Anemia in other chronic diseases classified elsewhere: Secondary | ICD-10-CM | POA: Diagnosis not present

## 2014-09-11 DIAGNOSIS — N186 End stage renal disease: Secondary | ICD-10-CM | POA: Diagnosis not present

## 2014-09-11 DIAGNOSIS — N2581 Secondary hyperparathyroidism of renal origin: Secondary | ICD-10-CM | POA: Diagnosis not present

## 2014-09-11 DIAGNOSIS — D638 Anemia in other chronic diseases classified elsewhere: Secondary | ICD-10-CM | POA: Diagnosis not present

## 2014-09-11 DIAGNOSIS — D631 Anemia in chronic kidney disease: Secondary | ICD-10-CM | POA: Diagnosis not present

## 2014-09-13 DIAGNOSIS — N186 End stage renal disease: Secondary | ICD-10-CM | POA: Diagnosis not present

## 2014-09-13 DIAGNOSIS — N2581 Secondary hyperparathyroidism of renal origin: Secondary | ICD-10-CM | POA: Diagnosis not present

## 2014-09-13 DIAGNOSIS — D638 Anemia in other chronic diseases classified elsewhere: Secondary | ICD-10-CM | POA: Diagnosis not present

## 2014-09-13 DIAGNOSIS — D631 Anemia in chronic kidney disease: Secondary | ICD-10-CM | POA: Diagnosis not present

## 2014-09-15 DIAGNOSIS — N186 End stage renal disease: Secondary | ICD-10-CM | POA: Diagnosis not present

## 2014-09-15 DIAGNOSIS — N2581 Secondary hyperparathyroidism of renal origin: Secondary | ICD-10-CM | POA: Diagnosis not present

## 2014-09-15 DIAGNOSIS — D631 Anemia in chronic kidney disease: Secondary | ICD-10-CM | POA: Diagnosis not present

## 2014-09-15 DIAGNOSIS — D638 Anemia in other chronic diseases classified elsewhere: Secondary | ICD-10-CM | POA: Diagnosis not present

## 2014-09-17 ENCOUNTER — Other Ambulatory Visit: Payer: Self-pay

## 2014-09-17 NOTE — Patient Outreach (Signed)
Spoke with [name] with [employer] Patient Assistance Program regarding status of patient's application for Lantus.  Patient has been denied at this point due to having to meet an out of pocket spend down of 5% of annual income.  Spoke with patient to advise of this information.  Patient stated understanding.  Notified Jacqlyn Larsen, RN of these findings.

## 2014-09-18 DIAGNOSIS — D631 Anemia in chronic kidney disease: Secondary | ICD-10-CM | POA: Diagnosis not present

## 2014-09-18 DIAGNOSIS — D638 Anemia in other chronic diseases classified elsewhere: Secondary | ICD-10-CM | POA: Diagnosis not present

## 2014-09-18 DIAGNOSIS — N2581 Secondary hyperparathyroidism of renal origin: Secondary | ICD-10-CM | POA: Diagnosis not present

## 2014-09-18 DIAGNOSIS — N186 End stage renal disease: Secondary | ICD-10-CM | POA: Diagnosis not present

## 2014-09-20 DIAGNOSIS — N186 End stage renal disease: Secondary | ICD-10-CM | POA: Diagnosis not present

## 2014-09-20 DIAGNOSIS — N2581 Secondary hyperparathyroidism of renal origin: Secondary | ICD-10-CM | POA: Diagnosis not present

## 2014-09-20 DIAGNOSIS — D638 Anemia in other chronic diseases classified elsewhere: Secondary | ICD-10-CM | POA: Diagnosis not present

## 2014-09-20 DIAGNOSIS — E1129 Type 2 diabetes mellitus with other diabetic kidney complication: Secondary | ICD-10-CM | POA: Diagnosis not present

## 2014-09-20 DIAGNOSIS — D631 Anemia in chronic kidney disease: Secondary | ICD-10-CM | POA: Diagnosis not present

## 2014-09-22 DIAGNOSIS — D631 Anemia in chronic kidney disease: Secondary | ICD-10-CM | POA: Diagnosis not present

## 2014-09-22 DIAGNOSIS — N186 End stage renal disease: Secondary | ICD-10-CM | POA: Diagnosis not present

## 2014-09-22 DIAGNOSIS — N2581 Secondary hyperparathyroidism of renal origin: Secondary | ICD-10-CM | POA: Diagnosis not present

## 2014-09-22 DIAGNOSIS — D638 Anemia in other chronic diseases classified elsewhere: Secondary | ICD-10-CM | POA: Diagnosis not present

## 2014-09-25 DIAGNOSIS — D631 Anemia in chronic kidney disease: Secondary | ICD-10-CM | POA: Diagnosis not present

## 2014-09-25 DIAGNOSIS — D638 Anemia in other chronic diseases classified elsewhere: Secondary | ICD-10-CM | POA: Diagnosis not present

## 2014-09-25 DIAGNOSIS — N2581 Secondary hyperparathyroidism of renal origin: Secondary | ICD-10-CM | POA: Diagnosis not present

## 2014-09-25 DIAGNOSIS — N186 End stage renal disease: Secondary | ICD-10-CM | POA: Diagnosis not present

## 2014-09-26 DIAGNOSIS — Z7901 Long term (current) use of anticoagulants: Secondary | ICD-10-CM | POA: Diagnosis not present

## 2014-09-26 DIAGNOSIS — I80202 Phlebitis and thrombophlebitis of unspecified deep vessels of left lower extremity: Secondary | ICD-10-CM | POA: Diagnosis not present

## 2014-09-27 DIAGNOSIS — N186 End stage renal disease: Secondary | ICD-10-CM | POA: Diagnosis not present

## 2014-09-27 DIAGNOSIS — N2581 Secondary hyperparathyroidism of renal origin: Secondary | ICD-10-CM | POA: Diagnosis not present

## 2014-09-27 DIAGNOSIS — D638 Anemia in other chronic diseases classified elsewhere: Secondary | ICD-10-CM | POA: Diagnosis not present

## 2014-09-27 DIAGNOSIS — D631 Anemia in chronic kidney disease: Secondary | ICD-10-CM | POA: Diagnosis not present

## 2014-09-29 DIAGNOSIS — Z992 Dependence on renal dialysis: Secondary | ICD-10-CM | POA: Diagnosis not present

## 2014-09-29 DIAGNOSIS — N2581 Secondary hyperparathyroidism of renal origin: Secondary | ICD-10-CM | POA: Diagnosis not present

## 2014-09-29 DIAGNOSIS — D638 Anemia in other chronic diseases classified elsewhere: Secondary | ICD-10-CM | POA: Diagnosis not present

## 2014-09-29 DIAGNOSIS — N186 End stage renal disease: Secondary | ICD-10-CM | POA: Diagnosis not present

## 2014-09-29 DIAGNOSIS — E1129 Type 2 diabetes mellitus with other diabetic kidney complication: Secondary | ICD-10-CM | POA: Diagnosis not present

## 2014-09-29 DIAGNOSIS — D631 Anemia in chronic kidney disease: Secondary | ICD-10-CM | POA: Diagnosis not present

## 2014-10-02 DIAGNOSIS — D631 Anemia in chronic kidney disease: Secondary | ICD-10-CM | POA: Diagnosis not present

## 2014-10-02 DIAGNOSIS — N2581 Secondary hyperparathyroidism of renal origin: Secondary | ICD-10-CM | POA: Diagnosis not present

## 2014-10-02 DIAGNOSIS — N186 End stage renal disease: Secondary | ICD-10-CM | POA: Diagnosis not present

## 2014-10-02 DIAGNOSIS — E1129 Type 2 diabetes mellitus with other diabetic kidney complication: Secondary | ICD-10-CM | POA: Diagnosis not present

## 2014-10-02 DIAGNOSIS — D638 Anemia in other chronic diseases classified elsewhere: Secondary | ICD-10-CM | POA: Diagnosis not present

## 2014-10-04 DIAGNOSIS — N186 End stage renal disease: Secondary | ICD-10-CM | POA: Diagnosis not present

## 2014-10-04 DIAGNOSIS — N2581 Secondary hyperparathyroidism of renal origin: Secondary | ICD-10-CM | POA: Diagnosis not present

## 2014-10-04 DIAGNOSIS — E1129 Type 2 diabetes mellitus with other diabetic kidney complication: Secondary | ICD-10-CM | POA: Diagnosis not present

## 2014-10-04 DIAGNOSIS — D631 Anemia in chronic kidney disease: Secondary | ICD-10-CM | POA: Diagnosis not present

## 2014-10-04 DIAGNOSIS — D638 Anemia in other chronic diseases classified elsewhere: Secondary | ICD-10-CM | POA: Diagnosis not present

## 2014-10-06 DIAGNOSIS — N186 End stage renal disease: Secondary | ICD-10-CM | POA: Diagnosis not present

## 2014-10-06 DIAGNOSIS — D638 Anemia in other chronic diseases classified elsewhere: Secondary | ICD-10-CM | POA: Diagnosis not present

## 2014-10-06 DIAGNOSIS — D631 Anemia in chronic kidney disease: Secondary | ICD-10-CM | POA: Diagnosis not present

## 2014-10-06 DIAGNOSIS — E1129 Type 2 diabetes mellitus with other diabetic kidney complication: Secondary | ICD-10-CM | POA: Diagnosis not present

## 2014-10-06 DIAGNOSIS — N2581 Secondary hyperparathyroidism of renal origin: Secondary | ICD-10-CM | POA: Diagnosis not present

## 2014-10-09 DIAGNOSIS — N186 End stage renal disease: Secondary | ICD-10-CM | POA: Diagnosis not present

## 2014-10-09 DIAGNOSIS — N2581 Secondary hyperparathyroidism of renal origin: Secondary | ICD-10-CM | POA: Diagnosis not present

## 2014-10-09 DIAGNOSIS — D638 Anemia in other chronic diseases classified elsewhere: Secondary | ICD-10-CM | POA: Diagnosis not present

## 2014-10-09 DIAGNOSIS — E1129 Type 2 diabetes mellitus with other diabetic kidney complication: Secondary | ICD-10-CM | POA: Diagnosis not present

## 2014-10-09 DIAGNOSIS — D631 Anemia in chronic kidney disease: Secondary | ICD-10-CM | POA: Diagnosis not present

## 2014-10-11 DIAGNOSIS — D631 Anemia in chronic kidney disease: Secondary | ICD-10-CM | POA: Diagnosis not present

## 2014-10-11 DIAGNOSIS — D638 Anemia in other chronic diseases classified elsewhere: Secondary | ICD-10-CM | POA: Diagnosis not present

## 2014-10-11 DIAGNOSIS — N186 End stage renal disease: Secondary | ICD-10-CM | POA: Diagnosis not present

## 2014-10-11 DIAGNOSIS — E1129 Type 2 diabetes mellitus with other diabetic kidney complication: Secondary | ICD-10-CM | POA: Diagnosis not present

## 2014-10-11 DIAGNOSIS — N2581 Secondary hyperparathyroidism of renal origin: Secondary | ICD-10-CM | POA: Diagnosis not present

## 2014-10-12 ENCOUNTER — Telehealth: Payer: Self-pay | Admitting: Internal Medicine

## 2014-10-12 NOTE — Telephone Encounter (Signed)
Pt scheduled to see Nicoletta Ba PA 10/17/14@2 :30pm. Abigail Butts to notify pt of appt date and time and to fax records.

## 2014-10-13 DIAGNOSIS — N2581 Secondary hyperparathyroidism of renal origin: Secondary | ICD-10-CM | POA: Diagnosis not present

## 2014-10-13 DIAGNOSIS — N186 End stage renal disease: Secondary | ICD-10-CM | POA: Diagnosis not present

## 2014-10-13 DIAGNOSIS — D631 Anemia in chronic kidney disease: Secondary | ICD-10-CM | POA: Diagnosis not present

## 2014-10-13 DIAGNOSIS — E1129 Type 2 diabetes mellitus with other diabetic kidney complication: Secondary | ICD-10-CM | POA: Diagnosis not present

## 2014-10-13 DIAGNOSIS — D638 Anemia in other chronic diseases classified elsewhere: Secondary | ICD-10-CM | POA: Diagnosis not present

## 2014-10-16 DIAGNOSIS — D638 Anemia in other chronic diseases classified elsewhere: Secondary | ICD-10-CM | POA: Diagnosis not present

## 2014-10-16 DIAGNOSIS — N186 End stage renal disease: Secondary | ICD-10-CM | POA: Diagnosis not present

## 2014-10-16 DIAGNOSIS — D631 Anemia in chronic kidney disease: Secondary | ICD-10-CM | POA: Diagnosis not present

## 2014-10-16 DIAGNOSIS — N2581 Secondary hyperparathyroidism of renal origin: Secondary | ICD-10-CM | POA: Diagnosis not present

## 2014-10-16 DIAGNOSIS — E1129 Type 2 diabetes mellitus with other diabetic kidney complication: Secondary | ICD-10-CM | POA: Diagnosis not present

## 2014-10-17 ENCOUNTER — Ambulatory Visit: Payer: Medicare Other | Admitting: Physician Assistant

## 2014-10-18 DIAGNOSIS — D638 Anemia in other chronic diseases classified elsewhere: Secondary | ICD-10-CM | POA: Diagnosis not present

## 2014-10-18 DIAGNOSIS — D631 Anemia in chronic kidney disease: Secondary | ICD-10-CM | POA: Diagnosis not present

## 2014-10-18 DIAGNOSIS — E1129 Type 2 diabetes mellitus with other diabetic kidney complication: Secondary | ICD-10-CM | POA: Diagnosis not present

## 2014-10-18 DIAGNOSIS — N186 End stage renal disease: Secondary | ICD-10-CM | POA: Diagnosis not present

## 2014-10-18 DIAGNOSIS — N2581 Secondary hyperparathyroidism of renal origin: Secondary | ICD-10-CM | POA: Diagnosis not present

## 2014-10-20 DIAGNOSIS — D638 Anemia in other chronic diseases classified elsewhere: Secondary | ICD-10-CM | POA: Diagnosis not present

## 2014-10-20 DIAGNOSIS — E1129 Type 2 diabetes mellitus with other diabetic kidney complication: Secondary | ICD-10-CM | POA: Diagnosis not present

## 2014-10-20 DIAGNOSIS — N186 End stage renal disease: Secondary | ICD-10-CM | POA: Diagnosis not present

## 2014-10-20 DIAGNOSIS — D631 Anemia in chronic kidney disease: Secondary | ICD-10-CM | POA: Diagnosis not present

## 2014-10-20 DIAGNOSIS — N2581 Secondary hyperparathyroidism of renal origin: Secondary | ICD-10-CM | POA: Diagnosis not present

## 2014-10-22 DIAGNOSIS — N186 End stage renal disease: Secondary | ICD-10-CM | POA: Diagnosis not present

## 2014-10-22 DIAGNOSIS — T82858D Stenosis of vascular prosthetic devices, implants and grafts, subsequent encounter: Secondary | ICD-10-CM | POA: Diagnosis not present

## 2014-10-22 DIAGNOSIS — I871 Compression of vein: Secondary | ICD-10-CM | POA: Diagnosis not present

## 2014-10-22 DIAGNOSIS — Z992 Dependence on renal dialysis: Secondary | ICD-10-CM | POA: Diagnosis not present

## 2014-10-23 DIAGNOSIS — E1129 Type 2 diabetes mellitus with other diabetic kidney complication: Secondary | ICD-10-CM | POA: Diagnosis not present

## 2014-10-23 DIAGNOSIS — N2581 Secondary hyperparathyroidism of renal origin: Secondary | ICD-10-CM | POA: Diagnosis not present

## 2014-10-23 DIAGNOSIS — D631 Anemia in chronic kidney disease: Secondary | ICD-10-CM | POA: Diagnosis not present

## 2014-10-23 DIAGNOSIS — N186 End stage renal disease: Secondary | ICD-10-CM | POA: Diagnosis not present

## 2014-10-23 DIAGNOSIS — D638 Anemia in other chronic diseases classified elsewhere: Secondary | ICD-10-CM | POA: Diagnosis not present

## 2014-10-25 DIAGNOSIS — D631 Anemia in chronic kidney disease: Secondary | ICD-10-CM | POA: Diagnosis not present

## 2014-10-25 DIAGNOSIS — N186 End stage renal disease: Secondary | ICD-10-CM | POA: Diagnosis not present

## 2014-10-25 DIAGNOSIS — E1129 Type 2 diabetes mellitus with other diabetic kidney complication: Secondary | ICD-10-CM | POA: Diagnosis not present

## 2014-10-25 DIAGNOSIS — N2581 Secondary hyperparathyroidism of renal origin: Secondary | ICD-10-CM | POA: Diagnosis not present

## 2014-10-25 DIAGNOSIS — D638 Anemia in other chronic diseases classified elsewhere: Secondary | ICD-10-CM | POA: Diagnosis not present

## 2014-10-27 DIAGNOSIS — D631 Anemia in chronic kidney disease: Secondary | ICD-10-CM | POA: Diagnosis not present

## 2014-10-27 DIAGNOSIS — D638 Anemia in other chronic diseases classified elsewhere: Secondary | ICD-10-CM | POA: Diagnosis not present

## 2014-10-27 DIAGNOSIS — E1129 Type 2 diabetes mellitus with other diabetic kidney complication: Secondary | ICD-10-CM | POA: Diagnosis not present

## 2014-10-27 DIAGNOSIS — N186 End stage renal disease: Secondary | ICD-10-CM | POA: Diagnosis not present

## 2014-10-27 DIAGNOSIS — N2581 Secondary hyperparathyroidism of renal origin: Secondary | ICD-10-CM | POA: Diagnosis not present

## 2014-10-28 ENCOUNTER — Encounter (HOSPITAL_COMMUNITY): Payer: Self-pay | Admitting: Family Medicine

## 2014-10-28 ENCOUNTER — Emergency Department (HOSPITAL_COMMUNITY)
Admission: EM | Admit: 2014-10-28 | Discharge: 2014-10-28 | Disposition: A | Discharge status: Home / Self Care | Payer: Medicare Other | Attending: Emergency Medicine | Admitting: Emergency Medicine

## 2014-10-28 DIAGNOSIS — I129 Hypertensive chronic kidney disease with stage 1 through stage 4 chronic kidney disease, or unspecified chronic kidney disease: Secondary | ICD-10-CM | POA: Diagnosis not present

## 2014-10-28 DIAGNOSIS — E1121 Type 2 diabetes mellitus with diabetic nephropathy: Secondary | ICD-10-CM | POA: Diagnosis not present

## 2014-10-28 DIAGNOSIS — Z79899 Other long term (current) drug therapy: Secondary | ICD-10-CM | POA: Diagnosis not present

## 2014-10-28 DIAGNOSIS — Z992 Dependence on renal dialysis: Secondary | ICD-10-CM | POA: Diagnosis not present

## 2014-10-28 DIAGNOSIS — Z7901 Long term (current) use of anticoagulants: Secondary | ICD-10-CM | POA: Insufficient documentation

## 2014-10-28 DIAGNOSIS — L02811 Cutaneous abscess of head [any part, except face]: Secondary | ICD-10-CM | POA: Diagnosis present

## 2014-10-28 DIAGNOSIS — R000 Tachycardia, unspecified: Secondary | ICD-10-CM | POA: Diagnosis not present

## 2014-10-28 DIAGNOSIS — L02831 Carbuncle of head [any part, except face]: Secondary | ICD-10-CM | POA: Diagnosis not present

## 2014-10-28 DIAGNOSIS — L089 Local infection of the skin and subcutaneous tissue, unspecified: Secondary | ICD-10-CM | POA: Diagnosis not present

## 2014-10-28 DIAGNOSIS — L0293 Carbuncle, unspecified: Secondary | ICD-10-CM

## 2014-10-28 DIAGNOSIS — Z8719 Personal history of other diseases of the digestive system: Secondary | ICD-10-CM | POA: Diagnosis not present

## 2014-10-28 DIAGNOSIS — Z794 Long term (current) use of insulin: Secondary | ICD-10-CM | POA: Diagnosis not present

## 2014-10-28 DIAGNOSIS — E11319 Type 2 diabetes mellitus with unspecified diabetic retinopathy without macular edema: Secondary | ICD-10-CM | POA: Diagnosis not present

## 2014-10-28 DIAGNOSIS — Z8701 Personal history of pneumonia (recurrent): Secondary | ICD-10-CM | POA: Diagnosis not present

## 2014-10-28 DIAGNOSIS — Z86718 Personal history of other venous thrombosis and embolism: Secondary | ICD-10-CM | POA: Insufficient documentation

## 2014-10-28 DIAGNOSIS — Z862 Personal history of diseases of the blood and blood-forming organs and certain disorders involving the immune mechanism: Secondary | ICD-10-CM | POA: Insufficient documentation

## 2014-10-28 DIAGNOSIS — M1990 Unspecified osteoarthritis, unspecified site: Secondary | ICD-10-CM | POA: Insufficient documentation

## 2014-10-28 DIAGNOSIS — N184 Chronic kidney disease, stage 4 (severe): Secondary | ICD-10-CM | POA: Insufficient documentation

## 2014-10-28 DIAGNOSIS — R69 Illness, unspecified: Secondary | ICD-10-CM

## 2014-10-28 LAB — BASIC METABOLIC PANEL
Anion gap: 13 (ref 5–15)
BUN: 31 mg/dL — ABNORMAL HIGH (ref 6–20)
CO2: 31 mmol/L (ref 22–32)
Calcium: 8.7 mg/dL — ABNORMAL LOW (ref 8.9–10.3)
Chloride: 91 mmol/L — ABNORMAL LOW (ref 101–111)
Creatinine, Ser: 6.27 mg/dL — ABNORMAL HIGH (ref 0.61–1.24)
GFR calc Af Amer: 10 mL/min — ABNORMAL LOW (ref >60)
GFR calc non Af Amer: 9 mL/min — ABNORMAL LOW (ref >60)
Glucose, Bld: 293 mg/dL — ABNORMAL HIGH (ref 65–99)
Potassium: 4.5 mmol/L (ref 3.5–5.1)
Sodium: 135 mmol/L (ref 135–145)

## 2014-10-28 LAB — CBC WITH DIFFERENTIAL/PLATELET
Basophils Absolute: 0 10*3/uL (ref 0.0–0.1)
Basophils Relative: 1 % (ref 0–1)
Eosinophils Absolute: 0.1 10*3/uL (ref 0.0–0.7)
Eosinophils Relative: 2 % (ref 0–5)
HCT: 32.6 % — ABNORMAL LOW (ref 39.0–52.0)
Hemoglobin: 10.5 g/dL — ABNORMAL LOW (ref 13.0–17.0)
Lymphocytes Relative: 29 % (ref 12–46)
Lymphs Abs: 1.1 10*3/uL (ref 0.7–4.0)
MCH: 28.8 pg (ref 26.0–34.0)
MCHC: 32.2 g/dL (ref 30.0–36.0)
MCV: 89.6 fL (ref 78.0–100.0)
Monocytes Absolute: 0.1 10*3/uL (ref 0.1–1.0)
Monocytes Relative: 3 % (ref 3–12)
Neutro Abs: 2.6 10*3/uL (ref 1.7–7.7)
Neutrophils Relative %: 65 % (ref 43–77)
Platelets: 175 10*3/uL (ref 150–400)
RBC: 3.64 MIL/uL — ABNORMAL LOW (ref 4.22–5.81)
RDW: 17.8 % — ABNORMAL HIGH (ref 11.5–15.5)
WBC: 3.9 10*3/uL — ABNORMAL LOW (ref 4.0–10.5)

## 2014-10-28 LAB — I-STAT CG4 LACTIC ACID, ED: Lactic Acid, Venous: 2.34 mmol/L (ref 0.5–2.0)

## 2014-10-28 LAB — TROPONIN I: Troponin I: 0.05 ng/mL — ABNORMAL HIGH (ref <0.031)

## 2014-10-28 LAB — CBG MONITORING, ED: Glucose-Capillary: 294 mg/dL — ABNORMAL HIGH (ref 65–99)

## 2014-10-28 MED ORDER — PIPERACILLIN-TAZOBACTAM 3.375 G IVPB 30 MIN
3.3750 g | Freq: Once | INTRAVENOUS | Status: AC
  Administered 2014-10-28: 3.375 g via INTRAVENOUS
  Filled 2014-10-28: qty 50

## 2014-10-28 MED ORDER — LIDOCAINE-EPINEPHRINE (PF) 2 %-1:200000 IJ SOLN
10.0000 mL | Freq: Once | INTRAMUSCULAR | Status: AC
  Administered 2014-10-28: 10 mL
  Filled 2014-10-28: qty 20

## 2014-10-28 MED ORDER — DOXYCYCLINE HYCLATE 100 MG PO CAPS: 100.0000 mg | ORAL_CAPSULE | Freq: Two times a day (BID) | ORAL | Status: DC

## 2014-10-28 MED ORDER — INSULIN ASPART 100 UNIT/ML ~~LOC~~ SOLN: 10.0000 [IU] | Freq: Once | SUBCUTANEOUS | Status: DC

## 2014-10-28 MED ORDER — SEVELAMER CARBONATE 800 MG PO TABS: 800.0000 mg | ORAL_TABLET | Freq: Three times a day (TID) | ORAL | Status: DC

## 2014-10-28 MED ORDER — VANCOMYCIN HCL IN DEXTROSE 1-5 GM/200ML-% IV SOLN
1000.0000 mg | Freq: Once | INTRAVENOUS | Status: AC
  Administered 2014-10-28: 1000 mg via INTRAVENOUS
  Filled 2014-10-28: qty 200

## 2014-10-28 NOTE — Discharge Instructions (Signed)

## 2014-10-28 NOTE — ED Provider Notes (Signed)
CSN: [id number]     Arrival date & time 10/28/14  0903 History   First MD Initiated Contact with Patient 10/28/14 0920     Chief Complaint  Patient presents with  . Abscess     (Consider location/radiation/quality/duration/timing/severity/associated sxs/prior Treatment) HPI Patient states he has developed a nodule on the top of his head that has become very painful. It developed gradually over 3 days. He states that it causes sharp lancinating pains. There has been no drainage or discharge from it. He thought it seemed like an infected pimple. The patient is chronically on dialysis and compliant. Last dialysis was yesterday. He denies feeling ill or weak after dialysis. He has not had a fever that he knows of. There has been no cough or shortness of breath or chest pain. The patient reports he has taken his regular morning medications except for his Renvela and Humalog which she takes with meals. Past Medical History  Diagnosis Date  . DVT (deep venous thrombosis)     "got one in my right leg now; I've had one before too, not sure which leg" (08/05/2013)  . Diabetic retinopathy   . Hypertension   . Hyperlipidemia   . Shortness of breath   . Peripheral vascular disease   . Blood transfusion     "years ago; blood was low" (08/05/2013)  . Noncompliance 03/16/2012  . NSVT (nonsustained ventricular tachycardia) 03/18/2012  . Anemia   . GERD (gastroesophageal reflux disease)     uses alka seltzere on occas.   . Arthritis     HNP- lumbar  . Family history of anesthesia complication     " my son wakes up slowly"  . IDDM (insulin dependent diabetes mellitus)   . Pneumonia 2013    hosp.-   . Sleep apnea     "suppose to have a sleep study, but they never told me when. (08/05/2013)  . Headache(784.0)     "one q now and then" (08/05/2013)  . Diabetic nephropathy   . CKD (chronic kidney disease) stage 4, GFR 15-29 ml/min 03/18/2012  . ESRD (end stage renal disease) on dialysis     "just started  today, (08/04/2013)"  . Non Hodgkin's lymphoma     Tx 2009; "had chemo; it went away" (08/05/2013)  . Nodular lymphoma of intra-abdominal lymph nodes    Past Surgical History  Procedure Laterality Date  . Portacath placement    . Port-a-cath removal    . Coloscopy    . Pars plana vitrectomy  08/27/2011    Procedure: PARS PLANA VITRECTOMY WITH 25 GAUGE;  Surgeon: Hayden Pedro, MD;  Location: [facility];  Service: Ophthalmology;  Laterality: Left;  Repair of complex traction retinal detachment left eye  . Eye surgery Bilateral   . Pars plana vitrectomy  05/10/2012    Procedure: PARS PLANA VITRECTOMY WITH 25 GAUGE;  Surgeon: Hayden Pedro, MD;  Location: [facility];  Service: Ophthalmology;  Laterality: Right;  Repair Complex Traction Retinal Detachment  . Membrane peel  05/10/2012    Procedure: MEMBRANE PEEL;  Surgeon: Hayden Pedro, MD;  Location: [facility];  Service: Ophthalmology;  Laterality: Right;  . Photocoagulation with laser  05/10/2012    Procedure: PHOTOCOAGULATION WITH LASER;  Surgeon: Hayden Pedro, MD;  Location: [facility];  Service: Ophthalmology;  Laterality: Right;  . Gas insertion  05/10/2012    Procedure: INSERTION OF GAS;  Surgeon: Hayden Pedro, MD;  Location: [place];  Service: Ophthalmology;  Laterality: Right;  .  Vena cava filter placement  09/2012    due to preparation for surgery  . Av fistula placement Left 02/03/2013    Procedure: ARTERIOVENOUS (AV) FISTULA CREATION- LEFT RADIAL CEPHALIC; ULTRASOUND GUIDED;  Surgeon: Mal Misty, MD;  [facility];  Service: Vascular;  Laterality: Left;  . Cardiac catheterization     Family History  Problem Relation Age of Onset  . Anesthesia problems Son   . Hypertension Son   . Diabetes Father   . Hypertension Father   . Other Father     amputation   History  Substance Use Topics  . Smoking status: Never Smoker   . Smokeless tobacco: Never Used  . Alcohol Use: No    Review of Systems  10 Systems reviewed and are negative  for acute change except as noted in the HPI.   Allergies  Review of patient's allergies indicates no known allergies.  Home Medications   Prior to Admission medications   Medication Sig Start Date End Date Taking? Authorizing Provider  acetaminophen (TYLENOL) 325 MG tablet Take 650 mg by mouth every 6 (six) hours as needed (pain).   Yes Historical Provider, MD  amLODipine (NORVASC) 5 MG tablet Take 1 tablet (5 mg total) by mouth daily. 08/07/13  Yes Burnard Bunting, MD  insulin glargine (LANTUS) 100 UNIT/ML injection Inject 0.3 mLs (30 Units total) into the skin at bedtime. 08/07/13  Yes Burnard Bunting, MD  insulin lispro (HUMALOG) 100 UNIT/ML injection Inject 10 Units into the skin 3 (three) times daily with meals.    Yes Historical Provider, MD  multivitamin (RENA-VIT) TABS tablet Take 1 tablet by mouth at bedtime. 08/08/13  Yes Burnard Bunting, MD  polysaccharide iron (NIFEREX) 150 MG CAPS capsule Take 1 capsule (150 mg total) by mouth daily. 01/16/11  Yes Estela Leonie Green, MD  sevelamer carbonate (RENVELA) 800 MG tablet Take 1,600 mg by mouth 3 (three) times daily with meals.   Yes Historical Provider, MD  warfarin (COUMADIN) 5 MG tablet Take 10 mg by mouth every evening.    Yes Historical Provider, MD   BP 98/69 mmHg  Pulse 108  Temp(Src) 97.9 F (36.6 C) (Oral)  Resp 26  Ht 6' 3.5" (1.918 m)  Wt 282 lb 3 oz (127.999 kg)  BMI 34.79 kg/m2  SpO2 95% Physical Exam  Constitutional: He is oriented to person, place, and time. He appears well-developed and well-nourished.  HENT:  Right Ear: External ear normal.  Left Ear: External ear normal.  Nose: Nose normal.  Mouth/Throat: Oropharynx is clear and moist.  Nodular confluence in the scalp that is tender to palpation. A 1 cm by half centimeter slightly irregular soft tissue swelling. There is no vesicles associated. No evident local pustule. The remainder of the scalp is without palpable anomaly.  Eyes: EOM are normal. Pupils  are equal, round, and reactive to light.  Neck: Neck supple.  Cardiovascular: Regular rhythm, normal heart sounds and intact distal pulses.   Tachycardia  Pulmonary/Chest: Effort normal and breath sounds normal. No respiratory distress.  Abdominal: Soft. Bowel sounds are normal. He exhibits no distension. There is no tenderness.  Musculoskeletal: Normal range of motion. He exhibits no edema or tenderness.  Neurological: He is alert and oriented to person, place, and time. He has normal strength. Coordination normal. GCS eye subscore is 4. GCS verbal subscore is 5. GCS motor subscore is 6.  Skin: Skin is warm, dry and intact.  Psychiatric: He has a normal mood and affect.  ED Course  INCISION AND DRAINAGE Date/Time: 10/28/2014 11:46 AM Performed by: Charlesetta Shanks Authorized by: Charlesetta Shanks Type: abscess Body area: head/neck Location details: scalp Scalpel size: 11 Incision type: single straight Complexity: simple Comments: Betadine prep on the scalp. Single straight incision with no return of pus.   (including critical care time) Labs Review Labs Reviewed  BASIC METABOLIC PANEL - Abnormal; Notable for the following:    Chloride 91 (*)    Glucose, Bld 293 (*)    BUN 31 (*)    Creatinine, Ser 6.27 (*)    Calcium 8.7 (*)    GFR calc non Af Amer 9 (*)    GFR calc Af Amer 10 (*)    All other components within normal limits  TROPONIN I - Abnormal; Notable for the following:    Troponin I 0.05 (*)    All other components within normal limits  CBC WITH DIFFERENTIAL/PLATELET - Abnormal; Notable for the following:    WBC 3.9 (*)    RBC 3.64 (*)    Hemoglobin 10.5 (*)    HCT 32.6 (*)    RDW 17.8 (*)    All other components within normal limits  I-STAT CG4 LACTIC ACID, ED - Abnormal; Notable for the following:    Lactic Acid, Venous 2.34 (*)    All other components within normal limits  CBG MONITORING, ED - Abnormal; Notable for the following:    Glucose-Capillary  294 (*)    All other components within normal limits  CULTURE, BLOOD (ROUTINE X 2)  CULTURE, BLOOD (ROUTINE X 2)    Imaging Review No results found.   EKG Interpretation   Date/Time:  Sunday Oct 28 2014 09:19:44 EDT Ventricular Rate:  128 PR Interval:  40 QRS Duration: 165 QT Interval:  428 QTC Calculation: 625 R Axis:   -64 Text Interpretation:  consistent with old. tachycardia. Confirmed by  Johnney Killian, MD, Jeannie Done [phone]) on 10/28/2014 9:40:23 AM     Consult:Dr. [facility] hospitalist has evaluated the patient in the emergency department and does not find that he meets criteria for hospital admission for possible sepsis. See Dr. Johnny Bridge note. MDM   Final diagnoses:  Carbuncle  Severe comorbid illness   Patient presents with a scalp carbuncle. This is of 3 days duration and very painful. The patient otherwise does not have associated clinical symptoms of malaise, fever, nausea or general weakness. He however had a tachycardia of 120s and 110s but did not have other explanation. There were no medication compliance issues the patient did not appear to be volume depleted from dialysis. Diagnostic labs were obtained and an elevated lactic acid was identified. Clinically the patient has well appearance and clinically is not showing signs of sepsis. Based on this Dr. Amie Critchley did not think the tachycardia and lactic acidosis constituted admission criteria for sepsis or sepsis rule out.    Charlesetta Shanks, MD 10/28/14 1341

## 2014-10-28 NOTE — Consult Note (Signed)
Patient Demographics  [name], is a 64 y.o. male   MRN: [id number]   DOB - Jan 20, 1951  Admit Date - 10/28/2014    Outpatient Primary MD for the patient is Geoffery Lyons, MD  Consult requested in the Hospital by Charlesetta Shanks, MD, On 10/28/2014    Reason for consult , evaluation for possible sepsis and need for medical admission.   With History of -  Past Medical History  Diagnosis Date  . DVT (deep venous thrombosis)     "got one in my right leg now; I've had one before too, not sure which leg" (08/05/2013)  . Diabetic retinopathy   . Hypertension   . Hyperlipidemia   . Shortness of breath   . Peripheral vascular disease   . Blood transfusion     "years ago; blood was low" (08/05/2013)  . Noncompliance 03/16/2012  . NSVT (nonsustained ventricular tachycardia) 03/18/2012  . Anemia   . GERD (gastroesophageal reflux disease)     uses alka seltzere on occas.   . Arthritis     HNP- lumbar  . Family history of anesthesia complication     " my son wakes up slowly"  . IDDM (insulin dependent diabetes mellitus)   . Pneumonia 2013    hosp.-   . Sleep apnea     "suppose to have a sleep study, but they never told me when. (08/05/2013)  . Headache(784.0)     "one q now and then" (08/05/2013)  . Diabetic nephropathy   . CKD (chronic kidney disease) stage 4, GFR 15-29 ml/min 03/18/2012  . ESRD (end stage renal disease) on dialysis     "just started today, (08/04/2013)"  . Non Hodgkin's lymphoma     Tx 2009; "had chemo; it went away" (08/05/2013)  . Nodular lymphoma of intra-abdominal lymph nodes       Past Surgical History  Procedure Laterality Date  . Portacath placement    . Port-a-cath removal    . Coloscopy    . Pars plana vitrectomy  08/27/2011    Procedure: PARS PLANA VITRECTOMY WITH 25 GAUGE;  Surgeon: Hayden Pedro, MD;  Location: [place];  Service: Ophthalmology;  Laterality: Left;   Repair of complex traction retinal detachment left eye  . Eye surgery Bilateral   . Pars plana vitrectomy  05/10/2012    Procedure: PARS PLANA VITRECTOMY WITH 25 GAUGE;  Surgeon: Hayden Pedro, MD;  Location: [place];  Service: Ophthalmology;  Laterality: Right;  Repair Complex Traction Retinal Detachment  . Membrane peel  05/10/2012    Procedure: MEMBRANE PEEL;  Surgeon: Hayden Pedro, MD;  Location: [place];  Service: Ophthalmology;  Laterality: Right;  . Photocoagulation with laser  05/10/2012    Procedure: PHOTOCOAGULATION WITH LASER;  Surgeon: Hayden Pedro, MD;  Location: [place];  Service: Ophthalmology;  Laterality: Right;  . Gas insertion  05/10/2012    Procedure: INSERTION OF GAS;  Surgeon: Hayden Pedro, MD;  Location: [place] [facility];  Service: Ophthalmology;  Laterality:  Right;  . Vena cava filter placement  09/2012    due to preparation for surgery  . Av fistula placement Left 02/03/2013    Procedure: ARTERIOVENOUS (AV) FISTULA CREATION- LEFT RADIAL CEPHALIC; ULTRASOUND GUIDED;  Surgeon: Mal Misty, MD;  Location: [facility];  Service: Vascular;  Laterality: Left;  . Cardiac catheterization      in for   Chief Complaint  Patient presents with  . Abscess     HPI  [name]  is a 64 y.o. male, with past medical history of diabetes mellitus, end-stage renal disease on dialysis, thromboembolic disease with with IVC filter, on anticoagulation, non-Hodgkin lymphoma, severe chronic back pain, agent presents secondary to complaints of nodule developed on the right forehead area over the last 3 days, very painful, patient was seen by ED physician, who did bedside lancing of the nodule, thought to be secondary to carbuncle versus superficial skin infection, workup was significant for elevated lactic acid at 2.54, as well patient was noticed to have tachycardia upon presentation with heart rate initially was 128, patient denies any fever or chills, a febrile, no leukocytosis, denies dysuria,  reports he making small amount of urine, denies any cough, productive sputum, patient received IV vancomycin and Zosyn in ED,    Review of Systems    In addition to the HPI above,  No Fever-chills, No Headache, No changes with Vision or hearing, No problems swallowing food or Liquids, No Chest pain, Cough or Shortness of Breath, No Abdominal pain, No Nausea or Vommitting, Bowel movements are regular, No Blood in stool or Urine, No dysuria, Skin infection in the right upper forehead/scalp area No new joints pains-aches,  No new weakness, tingling, numbness in any extremity, No recent weight gain or loss, No dysuria, makes small amount of urine No significant Mental Stressors.  A full 10 point Review of Systems was done, except as stated above, all other Review of Systems were negative.   Social History History  Substance Use Topics  . Smoking status: Never Smoker   . Smokeless tobacco: Never Used  . Alcohol Use: No     Family History Family History  Problem Relation Age of Onset  . Anesthesia problems Son   . Hypertension Son   . Diabetes Father   . Hypertension Father   . Other Father     amputation     Prior to Admission medications   Medication Sig Start Date End Date Taking? Authorizing Provider  acetaminophen (TYLENOL) 325 MG tablet Take 650 mg by mouth every 6 (six) hours as needed (pain).   Yes Historical Provider, MD  amLODipine (NORVASC) 5 MG tablet Take 1 tablet (5 mg total) by mouth daily. 08/07/13  Yes Burnard Bunting, MD  insulin glargine (LANTUS) 100 UNIT/ML injection Inject 0.3 mLs (30 Units total) into the skin at bedtime. 08/07/13  Yes Burnard Bunting, MD  insulin lispro (HUMALOG) 100 UNIT/ML injection Inject 10 Units into the skin 3 (three) times daily with meals.    Yes Historical Provider, MD  multivitamin (RENA-VIT) TABS tablet Take 1 tablet by mouth at bedtime. 08/08/13  Yes Burnard Bunting, MD  polysaccharide iron (NIFEREX) 150 MG CAPS capsule Take  1 capsule (150 mg total) by mouth daily. 01/16/11  Yes Estela Leonie Green, MD  sevelamer carbonate (RENVELA) 800 MG tablet Take 1,600 mg by mouth 3 (three) times daily with meals.   Yes Historical Provider, MD  warfarin (COUMADIN) 5 MG tablet Take 10 mg by mouth every evening.  Yes Historical Provider, MD    Anti-infectives    Start     Dose/Rate Route Frequency Ordered Stop   10/28/14 1115  piperacillin-tazobactam (ZOSYN) IVPB 3.375 g     3.375 g 100 mL/hr over 30 Minutes Intravenous  Once 10/28/14 1102 10/28/14 1227   10/28/14 1115  vancomycin (VANCOCIN) IVPB 1000 mg/200 mL premix     1,000 mg 200 mL/hr over 60 Minutes Intravenous  Once 10/28/14 1102        Scheduled Meds: . insulin aspart  10 Units Subcutaneous Once  . sevelamer carbonate  800 mg Oral TID WC   Continuous Infusions: . vancomycin 1,000 mg (10/28/14 1155)   PRN Meds:.  No Known Allergies  Physical Exam  Vitals  Blood pressure 98/69, pulse 108, temperature 97.9 F (36.6 C), temperature source Oral, resp. rate 26, height 6' 3.5" (1.918 m), weight 127.999 kg (282 lb 3 oz), SpO2 95 %.   1. General well-nourished male lying in bed in NAD,   2. Normal affect and insight, Not Suicidal or Homicidal, Awake Alert, Oriented X 3.  3. No F.N deficits, ALL C.Nerves Intact, Strength 5/5 all 4 extremities, Sensation intact all 4 extremities, Plantars down going.  4. Ears and Eyes appear Normal, Conjunctivae clear, PERRLA. Moist Oral Mucosa.  5. Supple Neck, No JVD, No cervical lymphadenopathy appriciated, No Carotid Bruits.  6. Symmetrical Chest wall movement, Good air movement bilaterally, CTAB.  7. RRR, No Gallops, Rubs or Murmurs, No Parasternal Heave.  8. Positive Bowel Sounds, Abdomen Soft, No tenderness, No organomegaly appriciated,No rebound -guarding or rigidity.  9.  No Cyanosis, Normal Skin Turgor, No Skin Rash or Bruise. Had very small area of skin lesion in the right upper forehead/scalp area,  appears to be lanced, with area of dried blood at the incision site in the middle(1-2 mm), with no induration at this point.  10. Good muscle tone,  joints appear normal , no effusions, Normal ROM.    Data Review  CBC  Recent Labs Lab 10/28/14 0955  WBC 3.9*  HGB 10.5*  HCT 32.6*  PLT 175  MCV 89.6  MCH 28.8  MCHC 32.2  RDW 17.8*  LYMPHSABS 1.1  MONOABS 0.1  EOSABS 0.1  BASOSABS 0.0   ------------------------------------------------------------------------------------------------------------------  Chemistries   Recent Labs Lab 10/28/14 0955  NA 135  K 4.5  CL 91*  CO2 31  GLUCOSE 293*  BUN 31*  CREATININE 6.27*  CALCIUM 8.7*   ------------------------------------------------------------------------------------------------------------------ estimated creatinine clearance is 18 mL/min (by C-G formula based on Cr of 6.27). ------------------------------------------------------------------------------------------------------------------ No results for input(s): TSH, T4TOTAL, T3FREE, THYROIDAB in the last 72 hours.  Invalid input(s): FREET3   Coagulation profile No results for input(s): INR, PROTIME in the last 168 hours. ------------------------------------------------------------------------------------------------------------------- No results for input(s): DDIMER in the last 72 hours. -------------------------------------------------------------------------------------------------------------------  Cardiac Enzymes  Recent Labs Lab 10/28/14 0955  TROPONINI 0.05*   ------------------------------------------------------------------------------------------------------------------ Invalid input(s): POCBNP   ---------------------------------------------------------------------------------------------------------------  Urinalysis    Component Value Date/Time   COLORURINE YELLOW 08/04/2013 0249   APPEARANCEUR CLEAR 08/04/2013 0249   LABSPEC 1.013  08/04/2013 0249   PHURINE 7.5 08/04/2013 0249   GLUCOSEU 500* 08/04/2013 0249   HGBUR SMALL* 08/04/2013 0249   BILIRUBINUR NEGATIVE 08/04/2013 0249   KETONESUR NEGATIVE 08/04/2013 0249   PROTEINUR 100* 08/04/2013 0249   UROBILINOGEN 0.2 08/04/2013 0249   NITRITE NEGATIVE 08/04/2013 0249   LEUKOCYTESUR NEGATIVE 08/04/2013 0249     Imaging results:   No results found.  Assessment & Plan  Active Problems:   * No active hospital problems. *  Superficial skin infection/carbuncle - Patient is a febrile, no leukocytosis, at this point I feel no induration after the lesion was lanced, even though patient has elevated lactic acid, and mild tachycardia, patient does not appear to be septic/toxic, comfortable in bed, walking around with no difficulties, good appetite, lactic acid is borderline, tachycardia much improved after gentle hydration, patient baseline heart rate in the high 90's. - Patient can be discharged home on doxycycline 100 mg twice a day 7 days, patient was instructed to keep up his follow-up appointment with his PCP this coming Wednesday, to come back to ED if worsening skin lesion, or if he develop fever or chills.      Thank you for the consult,   Time spent: 35 minutes  Makaylie Dedeaux M.D on 10/28/2014 at 12:38 PM  Between 7am to 7pm - Pager - [phone]  After 7pm go to [email or web address] - password [email or web address]   Thank you for the consult, we will follow the patient with you in the [facility]   Office  [phone]

## 2014-10-28 NOTE — ED Notes (Signed)
Pt here for 3 days of bump to the top of head. Slight swelling. sts he has been getting some sharp pains in head.

## 2014-10-30 DIAGNOSIS — N186 End stage renal disease: Secondary | ICD-10-CM | POA: Diagnosis not present

## 2014-10-30 DIAGNOSIS — N2581 Secondary hyperparathyroidism of renal origin: Secondary | ICD-10-CM | POA: Diagnosis not present

## 2014-10-30 DIAGNOSIS — D638 Anemia in other chronic diseases classified elsewhere: Secondary | ICD-10-CM | POA: Diagnosis not present

## 2014-10-30 DIAGNOSIS — D631 Anemia in chronic kidney disease: Secondary | ICD-10-CM | POA: Diagnosis not present

## 2014-10-30 DIAGNOSIS — E1129 Type 2 diabetes mellitus with other diabetic kidney complication: Secondary | ICD-10-CM | POA: Diagnosis not present

## 2014-10-30 DIAGNOSIS — Z992 Dependence on renal dialysis: Secondary | ICD-10-CM | POA: Diagnosis not present

## 2014-11-01 DIAGNOSIS — D631 Anemia in chronic kidney disease: Secondary | ICD-10-CM | POA: Diagnosis not present

## 2014-11-01 DIAGNOSIS — Z23 Encounter for immunization: Secondary | ICD-10-CM | POA: Diagnosis not present

## 2014-11-01 DIAGNOSIS — E1129 Type 2 diabetes mellitus with other diabetic kidney complication: Secondary | ICD-10-CM | POA: Diagnosis not present

## 2014-11-01 DIAGNOSIS — N186 End stage renal disease: Secondary | ICD-10-CM | POA: Diagnosis not present

## 2014-11-01 DIAGNOSIS — N2581 Secondary hyperparathyroidism of renal origin: Secondary | ICD-10-CM | POA: Diagnosis not present

## 2014-11-03 DIAGNOSIS — N186 End stage renal disease: Secondary | ICD-10-CM | POA: Diagnosis not present

## 2014-11-03 DIAGNOSIS — D631 Anemia in chronic kidney disease: Secondary | ICD-10-CM | POA: Diagnosis not present

## 2014-11-03 DIAGNOSIS — E1129 Type 2 diabetes mellitus with other diabetic kidney complication: Secondary | ICD-10-CM | POA: Diagnosis not present

## 2014-11-03 DIAGNOSIS — Z23 Encounter for immunization: Secondary | ICD-10-CM | POA: Diagnosis not present

## 2014-11-03 DIAGNOSIS — N2581 Secondary hyperparathyroidism of renal origin: Secondary | ICD-10-CM | POA: Diagnosis not present

## 2014-11-03 LAB — CULTURE, BLOOD (ROUTINE X 2)
Culture: NO GROWTH
Culture: NO GROWTH

## 2014-11-06 DIAGNOSIS — Z23 Encounter for immunization: Secondary | ICD-10-CM | POA: Diagnosis not present

## 2014-11-06 DIAGNOSIS — N2581 Secondary hyperparathyroidism of renal origin: Secondary | ICD-10-CM | POA: Diagnosis not present

## 2014-11-06 DIAGNOSIS — E1129 Type 2 diabetes mellitus with other diabetic kidney complication: Secondary | ICD-10-CM | POA: Diagnosis not present

## 2014-11-06 DIAGNOSIS — N186 End stage renal disease: Secondary | ICD-10-CM | POA: Diagnosis not present

## 2014-11-06 DIAGNOSIS — D631 Anemia in chronic kidney disease: Secondary | ICD-10-CM | POA: Diagnosis not present

## 2014-11-07 DIAGNOSIS — I80202 Phlebitis and thrombophlebitis of unspecified deep vessels of left lower extremity: Secondary | ICD-10-CM | POA: Diagnosis not present

## 2014-11-07 DIAGNOSIS — Z7901 Long term (current) use of anticoagulants: Secondary | ICD-10-CM | POA: Diagnosis not present

## 2014-11-08 DIAGNOSIS — N186 End stage renal disease: Secondary | ICD-10-CM | POA: Diagnosis not present

## 2014-11-08 DIAGNOSIS — N2581 Secondary hyperparathyroidism of renal origin: Secondary | ICD-10-CM | POA: Diagnosis not present

## 2014-11-08 DIAGNOSIS — D631 Anemia in chronic kidney disease: Secondary | ICD-10-CM | POA: Diagnosis not present

## 2014-11-08 DIAGNOSIS — E1129 Type 2 diabetes mellitus with other diabetic kidney complication: Secondary | ICD-10-CM | POA: Diagnosis not present

## 2014-11-08 DIAGNOSIS — Z23 Encounter for immunization: Secondary | ICD-10-CM | POA: Diagnosis not present

## 2014-11-10 DIAGNOSIS — D631 Anemia in chronic kidney disease: Secondary | ICD-10-CM | POA: Diagnosis not present

## 2014-11-10 DIAGNOSIS — N186 End stage renal disease: Secondary | ICD-10-CM | POA: Diagnosis not present

## 2014-11-10 DIAGNOSIS — Z23 Encounter for immunization: Secondary | ICD-10-CM | POA: Diagnosis not present

## 2014-11-10 DIAGNOSIS — E1129 Type 2 diabetes mellitus with other diabetic kidney complication: Secondary | ICD-10-CM | POA: Diagnosis not present

## 2014-11-10 DIAGNOSIS — N2581 Secondary hyperparathyroidism of renal origin: Secondary | ICD-10-CM | POA: Diagnosis not present

## 2014-11-13 DIAGNOSIS — D631 Anemia in chronic kidney disease: Secondary | ICD-10-CM | POA: Diagnosis not present

## 2014-11-13 DIAGNOSIS — N186 End stage renal disease: Secondary | ICD-10-CM | POA: Diagnosis not present

## 2014-11-13 DIAGNOSIS — E1129 Type 2 diabetes mellitus with other diabetic kidney complication: Secondary | ICD-10-CM | POA: Diagnosis not present

## 2014-11-13 DIAGNOSIS — Z23 Encounter for immunization: Secondary | ICD-10-CM | POA: Diagnosis not present

## 2014-11-13 DIAGNOSIS — N2581 Secondary hyperparathyroidism of renal origin: Secondary | ICD-10-CM | POA: Diagnosis not present

## 2014-11-15 DIAGNOSIS — D631 Anemia in chronic kidney disease: Secondary | ICD-10-CM | POA: Diagnosis not present

## 2014-11-15 DIAGNOSIS — Z23 Encounter for immunization: Secondary | ICD-10-CM | POA: Diagnosis not present

## 2014-11-15 DIAGNOSIS — E1129 Type 2 diabetes mellitus with other diabetic kidney complication: Secondary | ICD-10-CM | POA: Diagnosis not present

## 2014-11-15 DIAGNOSIS — N2581 Secondary hyperparathyroidism of renal origin: Secondary | ICD-10-CM | POA: Diagnosis not present

## 2014-11-15 DIAGNOSIS — N186 End stage renal disease: Secondary | ICD-10-CM | POA: Diagnosis not present

## 2014-11-17 DIAGNOSIS — N2581 Secondary hyperparathyroidism of renal origin: Secondary | ICD-10-CM | POA: Diagnosis not present

## 2014-11-17 DIAGNOSIS — D631 Anemia in chronic kidney disease: Secondary | ICD-10-CM | POA: Diagnosis not present

## 2014-11-17 DIAGNOSIS — E1129 Type 2 diabetes mellitus with other diabetic kidney complication: Secondary | ICD-10-CM | POA: Diagnosis not present

## 2014-11-17 DIAGNOSIS — N186 End stage renal disease: Secondary | ICD-10-CM | POA: Diagnosis not present

## 2014-11-17 DIAGNOSIS — Z23 Encounter for immunization: Secondary | ICD-10-CM | POA: Diagnosis not present

## 2014-11-20 DIAGNOSIS — D631 Anemia in chronic kidney disease: Secondary | ICD-10-CM | POA: Diagnosis not present

## 2014-11-20 DIAGNOSIS — E1129 Type 2 diabetes mellitus with other diabetic kidney complication: Secondary | ICD-10-CM | POA: Diagnosis not present

## 2014-11-20 DIAGNOSIS — N186 End stage renal disease: Secondary | ICD-10-CM | POA: Diagnosis not present

## 2014-11-20 DIAGNOSIS — Z23 Encounter for immunization: Secondary | ICD-10-CM | POA: Diagnosis not present

## 2014-11-20 DIAGNOSIS — N2581 Secondary hyperparathyroidism of renal origin: Secondary | ICD-10-CM | POA: Diagnosis not present

## 2014-11-22 DIAGNOSIS — N186 End stage renal disease: Secondary | ICD-10-CM | POA: Diagnosis not present

## 2014-11-22 DIAGNOSIS — N2581 Secondary hyperparathyroidism of renal origin: Secondary | ICD-10-CM | POA: Diagnosis not present

## 2014-11-22 DIAGNOSIS — E1129 Type 2 diabetes mellitus with other diabetic kidney complication: Secondary | ICD-10-CM | POA: Diagnosis not present

## 2014-11-22 DIAGNOSIS — Z23 Encounter for immunization: Secondary | ICD-10-CM | POA: Diagnosis not present

## 2014-11-22 DIAGNOSIS — D631 Anemia in chronic kidney disease: Secondary | ICD-10-CM | POA: Diagnosis not present

## 2014-11-24 DIAGNOSIS — N2581 Secondary hyperparathyroidism of renal origin: Secondary | ICD-10-CM | POA: Diagnosis not present

## 2014-11-24 DIAGNOSIS — N186 End stage renal disease: Secondary | ICD-10-CM | POA: Diagnosis not present

## 2014-11-24 DIAGNOSIS — Z23 Encounter for immunization: Secondary | ICD-10-CM | POA: Diagnosis not present

## 2014-11-24 DIAGNOSIS — D631 Anemia in chronic kidney disease: Secondary | ICD-10-CM | POA: Diagnosis not present

## 2014-11-24 DIAGNOSIS — E1129 Type 2 diabetes mellitus with other diabetic kidney complication: Secondary | ICD-10-CM | POA: Diagnosis not present

## 2014-11-27 DIAGNOSIS — E1129 Type 2 diabetes mellitus with other diabetic kidney complication: Secondary | ICD-10-CM | POA: Diagnosis not present

## 2014-11-27 DIAGNOSIS — N2581 Secondary hyperparathyroidism of renal origin: Secondary | ICD-10-CM | POA: Diagnosis not present

## 2014-11-27 DIAGNOSIS — D631 Anemia in chronic kidney disease: Secondary | ICD-10-CM | POA: Diagnosis not present

## 2014-11-27 DIAGNOSIS — N186 End stage renal disease: Secondary | ICD-10-CM | POA: Diagnosis not present

## 2014-11-27 DIAGNOSIS — Z23 Encounter for immunization: Secondary | ICD-10-CM | POA: Diagnosis not present

## 2014-11-29 DIAGNOSIS — E1129 Type 2 diabetes mellitus with other diabetic kidney complication: Secondary | ICD-10-CM | POA: Diagnosis not present

## 2014-11-29 DIAGNOSIS — Z992 Dependence on renal dialysis: Secondary | ICD-10-CM | POA: Diagnosis not present

## 2014-11-29 DIAGNOSIS — N186 End stage renal disease: Secondary | ICD-10-CM | POA: Diagnosis not present

## 2014-11-29 DIAGNOSIS — D631 Anemia in chronic kidney disease: Secondary | ICD-10-CM | POA: Diagnosis not present

## 2014-11-29 DIAGNOSIS — N2581 Secondary hyperparathyroidism of renal origin: Secondary | ICD-10-CM | POA: Diagnosis not present

## 2014-11-29 DIAGNOSIS — Z23 Encounter for immunization: Secondary | ICD-10-CM | POA: Diagnosis not present

## 2014-11-30 ENCOUNTER — Ambulatory Visit (INDEPENDENT_AMBULATORY_CARE_PROVIDER_SITE_OTHER): Payer: Medicare Other | Admitting: Ophthalmology

## 2014-12-01 DIAGNOSIS — Z23 Encounter for immunization: Secondary | ICD-10-CM | POA: Diagnosis not present

## 2014-12-01 DIAGNOSIS — D631 Anemia in chronic kidney disease: Secondary | ICD-10-CM | POA: Diagnosis not present

## 2014-12-01 DIAGNOSIS — N2581 Secondary hyperparathyroidism of renal origin: Secondary | ICD-10-CM | POA: Diagnosis not present

## 2014-12-01 DIAGNOSIS — N186 End stage renal disease: Secondary | ICD-10-CM | POA: Diagnosis not present

## 2014-12-01 DIAGNOSIS — E1129 Type 2 diabetes mellitus with other diabetic kidney complication: Secondary | ICD-10-CM | POA: Diagnosis not present

## 2014-12-04 DIAGNOSIS — E1129 Type 2 diabetes mellitus with other diabetic kidney complication: Secondary | ICD-10-CM | POA: Diagnosis not present

## 2014-12-04 DIAGNOSIS — N186 End stage renal disease: Secondary | ICD-10-CM | POA: Diagnosis not present

## 2014-12-04 DIAGNOSIS — D631 Anemia in chronic kidney disease: Secondary | ICD-10-CM | POA: Diagnosis not present

## 2014-12-04 DIAGNOSIS — Z23 Encounter for immunization: Secondary | ICD-10-CM | POA: Diagnosis not present

## 2014-12-04 DIAGNOSIS — N2581 Secondary hyperparathyroidism of renal origin: Secondary | ICD-10-CM | POA: Diagnosis not present

## 2014-12-06 DIAGNOSIS — N186 End stage renal disease: Secondary | ICD-10-CM | POA: Diagnosis not present

## 2014-12-06 DIAGNOSIS — D631 Anemia in chronic kidney disease: Secondary | ICD-10-CM | POA: Diagnosis not present

## 2014-12-06 DIAGNOSIS — N2581 Secondary hyperparathyroidism of renal origin: Secondary | ICD-10-CM | POA: Diagnosis not present

## 2014-12-06 DIAGNOSIS — Z23 Encounter for immunization: Secondary | ICD-10-CM | POA: Diagnosis not present

## 2014-12-06 DIAGNOSIS — E1129 Type 2 diabetes mellitus with other diabetic kidney complication: Secondary | ICD-10-CM | POA: Diagnosis not present

## 2014-12-08 DIAGNOSIS — Z23 Encounter for immunization: Secondary | ICD-10-CM | POA: Diagnosis not present

## 2014-12-08 DIAGNOSIS — N186 End stage renal disease: Secondary | ICD-10-CM | POA: Diagnosis not present

## 2014-12-08 DIAGNOSIS — E1129 Type 2 diabetes mellitus with other diabetic kidney complication: Secondary | ICD-10-CM | POA: Diagnosis not present

## 2014-12-08 DIAGNOSIS — N2581 Secondary hyperparathyroidism of renal origin: Secondary | ICD-10-CM | POA: Diagnosis not present

## 2014-12-08 DIAGNOSIS — D631 Anemia in chronic kidney disease: Secondary | ICD-10-CM | POA: Diagnosis not present

## 2014-12-11 DIAGNOSIS — N186 End stage renal disease: Secondary | ICD-10-CM | POA: Diagnosis not present

## 2014-12-11 DIAGNOSIS — E1129 Type 2 diabetes mellitus with other diabetic kidney complication: Secondary | ICD-10-CM | POA: Diagnosis not present

## 2014-12-11 DIAGNOSIS — D631 Anemia in chronic kidney disease: Secondary | ICD-10-CM | POA: Diagnosis not present

## 2014-12-11 DIAGNOSIS — N2581 Secondary hyperparathyroidism of renal origin: Secondary | ICD-10-CM | POA: Diagnosis not present

## 2014-12-11 DIAGNOSIS — Z23 Encounter for immunization: Secondary | ICD-10-CM | POA: Diagnosis not present

## 2014-12-12 ENCOUNTER — Ambulatory Visit (INDEPENDENT_AMBULATORY_CARE_PROVIDER_SITE_OTHER): Payer: Medicare Other | Admitting: Ophthalmology

## 2014-12-13 DIAGNOSIS — D631 Anemia in chronic kidney disease: Secondary | ICD-10-CM | POA: Diagnosis not present

## 2014-12-13 DIAGNOSIS — Z23 Encounter for immunization: Secondary | ICD-10-CM | POA: Diagnosis not present

## 2014-12-13 DIAGNOSIS — E1129 Type 2 diabetes mellitus with other diabetic kidney complication: Secondary | ICD-10-CM | POA: Diagnosis not present

## 2014-12-13 DIAGNOSIS — N2581 Secondary hyperparathyroidism of renal origin: Secondary | ICD-10-CM | POA: Diagnosis not present

## 2014-12-13 DIAGNOSIS — N186 End stage renal disease: Secondary | ICD-10-CM | POA: Diagnosis not present

## 2014-12-15 DIAGNOSIS — N186 End stage renal disease: Secondary | ICD-10-CM | POA: Diagnosis not present

## 2014-12-15 DIAGNOSIS — N2581 Secondary hyperparathyroidism of renal origin: Secondary | ICD-10-CM | POA: Diagnosis not present

## 2014-12-15 DIAGNOSIS — Z23 Encounter for immunization: Secondary | ICD-10-CM | POA: Diagnosis not present

## 2014-12-15 DIAGNOSIS — D631 Anemia in chronic kidney disease: Secondary | ICD-10-CM | POA: Diagnosis not present

## 2014-12-15 DIAGNOSIS — E1129 Type 2 diabetes mellitus with other diabetic kidney complication: Secondary | ICD-10-CM | POA: Diagnosis not present

## 2014-12-18 DIAGNOSIS — N186 End stage renal disease: Secondary | ICD-10-CM | POA: Diagnosis not present

## 2014-12-18 DIAGNOSIS — E1129 Type 2 diabetes mellitus with other diabetic kidney complication: Secondary | ICD-10-CM | POA: Diagnosis not present

## 2014-12-18 DIAGNOSIS — Z23 Encounter for immunization: Secondary | ICD-10-CM | POA: Diagnosis not present

## 2014-12-18 DIAGNOSIS — N2581 Secondary hyperparathyroidism of renal origin: Secondary | ICD-10-CM | POA: Diagnosis not present

## 2014-12-18 DIAGNOSIS — D631 Anemia in chronic kidney disease: Secondary | ICD-10-CM | POA: Diagnosis not present

## 2014-12-19 ENCOUNTER — Ambulatory Visit (INDEPENDENT_AMBULATORY_CARE_PROVIDER_SITE_OTHER): Payer: Medicare Other | Admitting: Ophthalmology

## 2014-12-19 DIAGNOSIS — Z7901 Long term (current) use of anticoagulants: Secondary | ICD-10-CM | POA: Diagnosis not present

## 2014-12-19 DIAGNOSIS — I80202 Phlebitis and thrombophlebitis of unspecified deep vessels of left lower extremity: Secondary | ICD-10-CM | POA: Diagnosis not present

## 2014-12-20 DIAGNOSIS — Z23 Encounter for immunization: Secondary | ICD-10-CM | POA: Diagnosis not present

## 2014-12-20 DIAGNOSIS — N2581 Secondary hyperparathyroidism of renal origin: Secondary | ICD-10-CM | POA: Diagnosis not present

## 2014-12-20 DIAGNOSIS — E1129 Type 2 diabetes mellitus with other diabetic kidney complication: Secondary | ICD-10-CM | POA: Diagnosis not present

## 2014-12-20 DIAGNOSIS — N186 End stage renal disease: Secondary | ICD-10-CM | POA: Diagnosis not present

## 2014-12-20 DIAGNOSIS — D631 Anemia in chronic kidney disease: Secondary | ICD-10-CM | POA: Diagnosis not present

## 2014-12-21 DIAGNOSIS — Z6835 Body mass index (BMI) 35.0-35.9, adult: Secondary | ICD-10-CM | POA: Diagnosis not present

## 2014-12-21 DIAGNOSIS — L989 Disorder of the skin and subcutaneous tissue, unspecified: Secondary | ICD-10-CM | POA: Diagnosis not present

## 2014-12-22 DIAGNOSIS — D631 Anemia in chronic kidney disease: Secondary | ICD-10-CM | POA: Diagnosis not present

## 2014-12-22 DIAGNOSIS — N186 End stage renal disease: Secondary | ICD-10-CM | POA: Diagnosis not present

## 2014-12-22 DIAGNOSIS — N2581 Secondary hyperparathyroidism of renal origin: Secondary | ICD-10-CM | POA: Diagnosis not present

## 2014-12-22 DIAGNOSIS — Z23 Encounter for immunization: Secondary | ICD-10-CM | POA: Diagnosis not present

## 2014-12-22 DIAGNOSIS — E1129 Type 2 diabetes mellitus with other diabetic kidney complication: Secondary | ICD-10-CM | POA: Diagnosis not present

## 2014-12-25 DIAGNOSIS — N186 End stage renal disease: Secondary | ICD-10-CM | POA: Diagnosis not present

## 2014-12-25 DIAGNOSIS — N2581 Secondary hyperparathyroidism of renal origin: Secondary | ICD-10-CM | POA: Diagnosis not present

## 2014-12-25 DIAGNOSIS — E1129 Type 2 diabetes mellitus with other diabetic kidney complication: Secondary | ICD-10-CM | POA: Diagnosis not present

## 2014-12-25 DIAGNOSIS — D631 Anemia in chronic kidney disease: Secondary | ICD-10-CM | POA: Diagnosis not present

## 2014-12-25 DIAGNOSIS — Z23 Encounter for immunization: Secondary | ICD-10-CM | POA: Diagnosis not present

## 2014-12-26 ENCOUNTER — Ambulatory Visit (INDEPENDENT_AMBULATORY_CARE_PROVIDER_SITE_OTHER): Payer: Medicare Other | Admitting: Ophthalmology

## 2014-12-26 DIAGNOSIS — E11319 Type 2 diabetes mellitus with unspecified diabetic retinopathy without macular edema: Secondary | ICD-10-CM

## 2014-12-26 DIAGNOSIS — E11359 Type 2 diabetes mellitus with proliferative diabetic retinopathy without macular edema: Secondary | ICD-10-CM | POA: Diagnosis not present

## 2014-12-26 DIAGNOSIS — H35033 Hypertensive retinopathy, bilateral: Secondary | ICD-10-CM | POA: Diagnosis not present

## 2014-12-26 DIAGNOSIS — I10 Essential (primary) hypertension: Secondary | ICD-10-CM | POA: Diagnosis not present

## 2014-12-27 DIAGNOSIS — E1129 Type 2 diabetes mellitus with other diabetic kidney complication: Secondary | ICD-10-CM | POA: Diagnosis not present

## 2014-12-27 DIAGNOSIS — Z23 Encounter for immunization: Secondary | ICD-10-CM | POA: Diagnosis not present

## 2014-12-27 DIAGNOSIS — N2581 Secondary hyperparathyroidism of renal origin: Secondary | ICD-10-CM | POA: Diagnosis not present

## 2014-12-27 DIAGNOSIS — N186 End stage renal disease: Secondary | ICD-10-CM | POA: Diagnosis not present

## 2014-12-27 DIAGNOSIS — D631 Anemia in chronic kidney disease: Secondary | ICD-10-CM | POA: Diagnosis not present

## 2014-12-29 DIAGNOSIS — N2581 Secondary hyperparathyroidism of renal origin: Secondary | ICD-10-CM | POA: Diagnosis not present

## 2014-12-29 DIAGNOSIS — D631 Anemia in chronic kidney disease: Secondary | ICD-10-CM | POA: Diagnosis not present

## 2014-12-29 DIAGNOSIS — E1129 Type 2 diabetes mellitus with other diabetic kidney complication: Secondary | ICD-10-CM | POA: Diagnosis not present

## 2014-12-29 DIAGNOSIS — N186 End stage renal disease: Secondary | ICD-10-CM | POA: Diagnosis not present

## 2014-12-29 DIAGNOSIS — Z23 Encounter for immunization: Secondary | ICD-10-CM | POA: Diagnosis not present

## 2014-12-30 DIAGNOSIS — Z992 Dependence on renal dialysis: Secondary | ICD-10-CM | POA: Diagnosis not present

## 2014-12-30 DIAGNOSIS — E1129 Type 2 diabetes mellitus with other diabetic kidney complication: Secondary | ICD-10-CM | POA: Diagnosis not present

## 2014-12-30 DIAGNOSIS — N186 End stage renal disease: Secondary | ICD-10-CM | POA: Diagnosis not present

## 2015-01-01 DIAGNOSIS — N186 End stage renal disease: Secondary | ICD-10-CM | POA: Diagnosis not present

## 2015-01-01 DIAGNOSIS — N2581 Secondary hyperparathyroidism of renal origin: Secondary | ICD-10-CM | POA: Diagnosis not present

## 2015-01-01 DIAGNOSIS — E1129 Type 2 diabetes mellitus with other diabetic kidney complication: Secondary | ICD-10-CM | POA: Diagnosis not present

## 2015-01-01 DIAGNOSIS — D631 Anemia in chronic kidney disease: Secondary | ICD-10-CM | POA: Diagnosis not present

## 2015-01-01 DIAGNOSIS — D509 Iron deficiency anemia, unspecified: Secondary | ICD-10-CM | POA: Diagnosis not present

## 2015-01-03 DIAGNOSIS — N2581 Secondary hyperparathyroidism of renal origin: Secondary | ICD-10-CM | POA: Diagnosis not present

## 2015-01-03 DIAGNOSIS — E1129 Type 2 diabetes mellitus with other diabetic kidney complication: Secondary | ICD-10-CM | POA: Diagnosis not present

## 2015-01-03 DIAGNOSIS — D509 Iron deficiency anemia, unspecified: Secondary | ICD-10-CM | POA: Diagnosis not present

## 2015-01-03 DIAGNOSIS — N186 End stage renal disease: Secondary | ICD-10-CM | POA: Diagnosis not present

## 2015-01-03 DIAGNOSIS — D631 Anemia in chronic kidney disease: Secondary | ICD-10-CM | POA: Diagnosis not present

## 2015-01-05 DIAGNOSIS — N186 End stage renal disease: Secondary | ICD-10-CM | POA: Diagnosis not present

## 2015-01-05 DIAGNOSIS — E1129 Type 2 diabetes mellitus with other diabetic kidney complication: Secondary | ICD-10-CM | POA: Diagnosis not present

## 2015-01-05 DIAGNOSIS — D631 Anemia in chronic kidney disease: Secondary | ICD-10-CM | POA: Diagnosis not present

## 2015-01-05 DIAGNOSIS — D509 Iron deficiency anemia, unspecified: Secondary | ICD-10-CM | POA: Diagnosis not present

## 2015-01-05 DIAGNOSIS — N2581 Secondary hyperparathyroidism of renal origin: Secondary | ICD-10-CM | POA: Diagnosis not present

## 2015-01-08 DIAGNOSIS — D509 Iron deficiency anemia, unspecified: Secondary | ICD-10-CM | POA: Diagnosis not present

## 2015-01-08 DIAGNOSIS — D631 Anemia in chronic kidney disease: Secondary | ICD-10-CM | POA: Diagnosis not present

## 2015-01-08 DIAGNOSIS — E1129 Type 2 diabetes mellitus with other diabetic kidney complication: Secondary | ICD-10-CM | POA: Diagnosis not present

## 2015-01-08 DIAGNOSIS — N186 End stage renal disease: Secondary | ICD-10-CM | POA: Diagnosis not present

## 2015-01-08 DIAGNOSIS — N2581 Secondary hyperparathyroidism of renal origin: Secondary | ICD-10-CM | POA: Diagnosis not present

## 2015-01-10 DIAGNOSIS — E1129 Type 2 diabetes mellitus with other diabetic kidney complication: Secondary | ICD-10-CM | POA: Diagnosis not present

## 2015-01-10 DIAGNOSIS — N186 End stage renal disease: Secondary | ICD-10-CM | POA: Diagnosis not present

## 2015-01-10 DIAGNOSIS — N2581 Secondary hyperparathyroidism of renal origin: Secondary | ICD-10-CM | POA: Diagnosis not present

## 2015-01-10 DIAGNOSIS — D631 Anemia in chronic kidney disease: Secondary | ICD-10-CM | POA: Diagnosis not present

## 2015-01-10 DIAGNOSIS — D509 Iron deficiency anemia, unspecified: Secondary | ICD-10-CM | POA: Diagnosis not present

## 2015-01-12 DIAGNOSIS — D631 Anemia in chronic kidney disease: Secondary | ICD-10-CM | POA: Diagnosis not present

## 2015-01-12 DIAGNOSIS — N2581 Secondary hyperparathyroidism of renal origin: Secondary | ICD-10-CM | POA: Diagnosis not present

## 2015-01-12 DIAGNOSIS — E1129 Type 2 diabetes mellitus with other diabetic kidney complication: Secondary | ICD-10-CM | POA: Diagnosis not present

## 2015-01-12 DIAGNOSIS — D509 Iron deficiency anemia, unspecified: Secondary | ICD-10-CM | POA: Diagnosis not present

## 2015-01-12 DIAGNOSIS — N186 End stage renal disease: Secondary | ICD-10-CM | POA: Diagnosis not present

## 2015-01-15 DIAGNOSIS — N2581 Secondary hyperparathyroidism of renal origin: Secondary | ICD-10-CM | POA: Diagnosis not present

## 2015-01-15 DIAGNOSIS — D631 Anemia in chronic kidney disease: Secondary | ICD-10-CM | POA: Diagnosis not present

## 2015-01-15 DIAGNOSIS — N186 End stage renal disease: Secondary | ICD-10-CM | POA: Diagnosis not present

## 2015-01-15 DIAGNOSIS — D509 Iron deficiency anemia, unspecified: Secondary | ICD-10-CM | POA: Diagnosis not present

## 2015-01-15 DIAGNOSIS — E1129 Type 2 diabetes mellitus with other diabetic kidney complication: Secondary | ICD-10-CM | POA: Diagnosis not present

## 2015-01-17 DIAGNOSIS — E1129 Type 2 diabetes mellitus with other diabetic kidney complication: Secondary | ICD-10-CM | POA: Diagnosis not present

## 2015-01-17 DIAGNOSIS — N2581 Secondary hyperparathyroidism of renal origin: Secondary | ICD-10-CM | POA: Diagnosis not present

## 2015-01-17 DIAGNOSIS — D631 Anemia in chronic kidney disease: Secondary | ICD-10-CM | POA: Diagnosis not present

## 2015-01-17 DIAGNOSIS — D509 Iron deficiency anemia, unspecified: Secondary | ICD-10-CM | POA: Diagnosis not present

## 2015-01-17 DIAGNOSIS — N186 End stage renal disease: Secondary | ICD-10-CM | POA: Diagnosis not present

## 2015-01-19 DIAGNOSIS — D631 Anemia in chronic kidney disease: Secondary | ICD-10-CM | POA: Diagnosis not present

## 2015-01-19 DIAGNOSIS — N186 End stage renal disease: Secondary | ICD-10-CM | POA: Diagnosis not present

## 2015-01-19 DIAGNOSIS — N2581 Secondary hyperparathyroidism of renal origin: Secondary | ICD-10-CM | POA: Diagnosis not present

## 2015-01-19 DIAGNOSIS — D509 Iron deficiency anemia, unspecified: Secondary | ICD-10-CM | POA: Diagnosis not present

## 2015-01-19 DIAGNOSIS — E1129 Type 2 diabetes mellitus with other diabetic kidney complication: Secondary | ICD-10-CM | POA: Diagnosis not present

## 2015-01-22 DIAGNOSIS — N2581 Secondary hyperparathyroidism of renal origin: Secondary | ICD-10-CM | POA: Diagnosis not present

## 2015-01-22 DIAGNOSIS — N186 End stage renal disease: Secondary | ICD-10-CM | POA: Diagnosis not present

## 2015-01-22 DIAGNOSIS — E1129 Type 2 diabetes mellitus with other diabetic kidney complication: Secondary | ICD-10-CM | POA: Diagnosis not present

## 2015-01-22 DIAGNOSIS — D631 Anemia in chronic kidney disease: Secondary | ICD-10-CM | POA: Diagnosis not present

## 2015-01-22 DIAGNOSIS — D509 Iron deficiency anemia, unspecified: Secondary | ICD-10-CM | POA: Diagnosis not present

## 2015-01-23 DIAGNOSIS — Z7901 Long term (current) use of anticoagulants: Secondary | ICD-10-CM | POA: Diagnosis not present

## 2015-01-23 DIAGNOSIS — I80202 Phlebitis and thrombophlebitis of unspecified deep vessels of left lower extremity: Secondary | ICD-10-CM | POA: Diagnosis not present

## 2015-01-24 DIAGNOSIS — N2581 Secondary hyperparathyroidism of renal origin: Secondary | ICD-10-CM | POA: Diagnosis not present

## 2015-01-24 DIAGNOSIS — D631 Anemia in chronic kidney disease: Secondary | ICD-10-CM | POA: Diagnosis not present

## 2015-01-24 DIAGNOSIS — E1129 Type 2 diabetes mellitus with other diabetic kidney complication: Secondary | ICD-10-CM | POA: Diagnosis not present

## 2015-01-24 DIAGNOSIS — N186 End stage renal disease: Secondary | ICD-10-CM | POA: Diagnosis not present

## 2015-01-24 DIAGNOSIS — D509 Iron deficiency anemia, unspecified: Secondary | ICD-10-CM | POA: Diagnosis not present

## 2015-01-25 ENCOUNTER — Encounter (INDEPENDENT_AMBULATORY_CARE_PROVIDER_SITE_OTHER): Payer: Medicare Other | Admitting: Ophthalmology

## 2015-01-25 DIAGNOSIS — Z7901 Long term (current) use of anticoagulants: Secondary | ICD-10-CM | POA: Diagnosis not present

## 2015-01-25 DIAGNOSIS — H26491 Other secondary cataract, right eye: Secondary | ICD-10-CM

## 2015-01-25 DIAGNOSIS — I80202 Phlebitis and thrombophlebitis of unspecified deep vessels of left lower extremity: Secondary | ICD-10-CM | POA: Diagnosis not present

## 2015-01-26 DIAGNOSIS — E1129 Type 2 diabetes mellitus with other diabetic kidney complication: Secondary | ICD-10-CM | POA: Diagnosis not present

## 2015-01-26 DIAGNOSIS — D509 Iron deficiency anemia, unspecified: Secondary | ICD-10-CM | POA: Diagnosis not present

## 2015-01-26 DIAGNOSIS — N186 End stage renal disease: Secondary | ICD-10-CM | POA: Diagnosis not present

## 2015-01-26 DIAGNOSIS — N2581 Secondary hyperparathyroidism of renal origin: Secondary | ICD-10-CM | POA: Diagnosis not present

## 2015-01-26 DIAGNOSIS — D631 Anemia in chronic kidney disease: Secondary | ICD-10-CM | POA: Diagnosis not present

## 2015-01-29 DIAGNOSIS — E1129 Type 2 diabetes mellitus with other diabetic kidney complication: Secondary | ICD-10-CM | POA: Diagnosis not present

## 2015-01-29 DIAGNOSIS — N186 End stage renal disease: Secondary | ICD-10-CM | POA: Diagnosis not present

## 2015-01-29 DIAGNOSIS — D631 Anemia in chronic kidney disease: Secondary | ICD-10-CM | POA: Diagnosis not present

## 2015-01-29 DIAGNOSIS — N2581 Secondary hyperparathyroidism of renal origin: Secondary | ICD-10-CM | POA: Diagnosis not present

## 2015-01-29 DIAGNOSIS — D509 Iron deficiency anemia, unspecified: Secondary | ICD-10-CM | POA: Diagnosis not present

## 2015-01-30 DIAGNOSIS — N186 End stage renal disease: Secondary | ICD-10-CM | POA: Diagnosis not present

## 2015-01-30 DIAGNOSIS — E1129 Type 2 diabetes mellitus with other diabetic kidney complication: Secondary | ICD-10-CM | POA: Diagnosis not present

## 2015-01-30 DIAGNOSIS — Z992 Dependence on renal dialysis: Secondary | ICD-10-CM | POA: Diagnosis not present

## 2015-01-31 DIAGNOSIS — D509 Iron deficiency anemia, unspecified: Secondary | ICD-10-CM | POA: Diagnosis not present

## 2015-01-31 DIAGNOSIS — N2581 Secondary hyperparathyroidism of renal origin: Secondary | ICD-10-CM | POA: Diagnosis not present

## 2015-01-31 DIAGNOSIS — N186 End stage renal disease: Secondary | ICD-10-CM | POA: Diagnosis not present

## 2015-01-31 DIAGNOSIS — D631 Anemia in chronic kidney disease: Secondary | ICD-10-CM | POA: Diagnosis not present

## 2015-01-31 DIAGNOSIS — E1129 Type 2 diabetes mellitus with other diabetic kidney complication: Secondary | ICD-10-CM | POA: Diagnosis not present

## 2015-02-02 DIAGNOSIS — D509 Iron deficiency anemia, unspecified: Secondary | ICD-10-CM | POA: Diagnosis not present

## 2015-02-02 DIAGNOSIS — E1129 Type 2 diabetes mellitus with other diabetic kidney complication: Secondary | ICD-10-CM | POA: Diagnosis not present

## 2015-02-02 DIAGNOSIS — N2581 Secondary hyperparathyroidism of renal origin: Secondary | ICD-10-CM | POA: Diagnosis not present

## 2015-02-02 DIAGNOSIS — N186 End stage renal disease: Secondary | ICD-10-CM | POA: Diagnosis not present

## 2015-02-02 DIAGNOSIS — D631 Anemia in chronic kidney disease: Secondary | ICD-10-CM | POA: Diagnosis not present

## 2015-02-05 DIAGNOSIS — D509 Iron deficiency anemia, unspecified: Secondary | ICD-10-CM | POA: Diagnosis not present

## 2015-02-05 DIAGNOSIS — E1129 Type 2 diabetes mellitus with other diabetic kidney complication: Secondary | ICD-10-CM | POA: Diagnosis not present

## 2015-02-05 DIAGNOSIS — D631 Anemia in chronic kidney disease: Secondary | ICD-10-CM | POA: Diagnosis not present

## 2015-02-05 DIAGNOSIS — N2581 Secondary hyperparathyroidism of renal origin: Secondary | ICD-10-CM | POA: Diagnosis not present

## 2015-02-05 DIAGNOSIS — N186 End stage renal disease: Secondary | ICD-10-CM | POA: Diagnosis not present

## 2015-02-07 DIAGNOSIS — E1129 Type 2 diabetes mellitus with other diabetic kidney complication: Secondary | ICD-10-CM | POA: Diagnosis not present

## 2015-02-07 DIAGNOSIS — D631 Anemia in chronic kidney disease: Secondary | ICD-10-CM | POA: Diagnosis not present

## 2015-02-07 DIAGNOSIS — N2581 Secondary hyperparathyroidism of renal origin: Secondary | ICD-10-CM | POA: Diagnosis not present

## 2015-02-07 DIAGNOSIS — D509 Iron deficiency anemia, unspecified: Secondary | ICD-10-CM | POA: Diagnosis not present

## 2015-02-07 DIAGNOSIS — N186 End stage renal disease: Secondary | ICD-10-CM | POA: Diagnosis not present

## 2015-02-09 DIAGNOSIS — N186 End stage renal disease: Secondary | ICD-10-CM | POA: Diagnosis not present

## 2015-02-09 DIAGNOSIS — D509 Iron deficiency anemia, unspecified: Secondary | ICD-10-CM | POA: Diagnosis not present

## 2015-02-09 DIAGNOSIS — N2581 Secondary hyperparathyroidism of renal origin: Secondary | ICD-10-CM | POA: Diagnosis not present

## 2015-02-09 DIAGNOSIS — D631 Anemia in chronic kidney disease: Secondary | ICD-10-CM | POA: Diagnosis not present

## 2015-02-09 DIAGNOSIS — E1129 Type 2 diabetes mellitus with other diabetic kidney complication: Secondary | ICD-10-CM | POA: Diagnosis not present

## 2015-02-12 DIAGNOSIS — D509 Iron deficiency anemia, unspecified: Secondary | ICD-10-CM | POA: Diagnosis not present

## 2015-02-12 DIAGNOSIS — N186 End stage renal disease: Secondary | ICD-10-CM | POA: Diagnosis not present

## 2015-02-12 DIAGNOSIS — D631 Anemia in chronic kidney disease: Secondary | ICD-10-CM | POA: Diagnosis not present

## 2015-02-12 DIAGNOSIS — N2581 Secondary hyperparathyroidism of renal origin: Secondary | ICD-10-CM | POA: Diagnosis not present

## 2015-02-12 DIAGNOSIS — E1129 Type 2 diabetes mellitus with other diabetic kidney complication: Secondary | ICD-10-CM | POA: Diagnosis not present

## 2015-02-14 DIAGNOSIS — D509 Iron deficiency anemia, unspecified: Secondary | ICD-10-CM | POA: Diagnosis not present

## 2015-02-14 DIAGNOSIS — N186 End stage renal disease: Secondary | ICD-10-CM | POA: Diagnosis not present

## 2015-02-14 DIAGNOSIS — N2581 Secondary hyperparathyroidism of renal origin: Secondary | ICD-10-CM | POA: Diagnosis not present

## 2015-02-14 DIAGNOSIS — D631 Anemia in chronic kidney disease: Secondary | ICD-10-CM | POA: Diagnosis not present

## 2015-02-14 DIAGNOSIS — E1129 Type 2 diabetes mellitus with other diabetic kidney complication: Secondary | ICD-10-CM | POA: Diagnosis not present

## 2015-02-15 ENCOUNTER — Ambulatory Visit (INDEPENDENT_AMBULATORY_CARE_PROVIDER_SITE_OTHER): Payer: Medicare Other | Admitting: Ophthalmology

## 2015-02-15 DIAGNOSIS — H2701 Aphakia, right eye: Secondary | ICD-10-CM

## 2015-02-16 DIAGNOSIS — E1129 Type 2 diabetes mellitus with other diabetic kidney complication: Secondary | ICD-10-CM | POA: Diagnosis not present

## 2015-02-16 DIAGNOSIS — N2581 Secondary hyperparathyroidism of renal origin: Secondary | ICD-10-CM | POA: Diagnosis not present

## 2015-02-16 DIAGNOSIS — D509 Iron deficiency anemia, unspecified: Secondary | ICD-10-CM | POA: Diagnosis not present

## 2015-02-16 DIAGNOSIS — N186 End stage renal disease: Secondary | ICD-10-CM | POA: Diagnosis not present

## 2015-02-16 DIAGNOSIS — D631 Anemia in chronic kidney disease: Secondary | ICD-10-CM | POA: Diagnosis not present

## 2015-02-18 ENCOUNTER — Encounter: Payer: Self-pay | Admitting: Cardiology

## 2015-02-18 DIAGNOSIS — I4891 Unspecified atrial fibrillation: Secondary | ICD-10-CM | POA: Diagnosis not present

## 2015-02-18 DIAGNOSIS — Z23 Encounter for immunization: Secondary | ICD-10-CM | POA: Diagnosis not present

## 2015-02-18 DIAGNOSIS — E1139 Type 2 diabetes mellitus with other diabetic ophthalmic complication: Secondary | ICD-10-CM | POA: Diagnosis not present

## 2015-02-18 DIAGNOSIS — N186 End stage renal disease: Secondary | ICD-10-CM | POA: Diagnosis not present

## 2015-02-18 DIAGNOSIS — E1140 Type 2 diabetes mellitus with diabetic neuropathy, unspecified: Secondary | ICD-10-CM | POA: Diagnosis not present

## 2015-02-18 DIAGNOSIS — Z6835 Body mass index (BMI) 35.0-35.9, adult: Secondary | ICD-10-CM | POA: Diagnosis not present

## 2015-02-18 DIAGNOSIS — Z7901 Long term (current) use of anticoagulants: Secondary | ICD-10-CM | POA: Diagnosis not present

## 2015-02-18 DIAGNOSIS — I10 Essential (primary) hypertension: Secondary | ICD-10-CM | POA: Diagnosis not present

## 2015-02-18 DIAGNOSIS — E669 Obesity, unspecified: Secondary | ICD-10-CM | POA: Diagnosis not present

## 2015-02-18 DIAGNOSIS — E11359 Type 2 diabetes mellitus with proliferative diabetic retinopathy without macular edema: Secondary | ICD-10-CM | POA: Diagnosis not present

## 2015-02-18 DIAGNOSIS — G629 Polyneuropathy, unspecified: Secondary | ICD-10-CM | POA: Diagnosis not present

## 2015-02-18 DIAGNOSIS — E1129 Type 2 diabetes mellitus with other diabetic kidney complication: Secondary | ICD-10-CM | POA: Diagnosis not present

## 2015-02-19 DIAGNOSIS — D509 Iron deficiency anemia, unspecified: Secondary | ICD-10-CM | POA: Diagnosis not present

## 2015-02-19 DIAGNOSIS — E1129 Type 2 diabetes mellitus with other diabetic kidney complication: Secondary | ICD-10-CM | POA: Diagnosis not present

## 2015-02-19 DIAGNOSIS — D631 Anemia in chronic kidney disease: Secondary | ICD-10-CM | POA: Diagnosis not present

## 2015-02-19 DIAGNOSIS — N2581 Secondary hyperparathyroidism of renal origin: Secondary | ICD-10-CM | POA: Diagnosis not present

## 2015-02-19 DIAGNOSIS — N186 End stage renal disease: Secondary | ICD-10-CM | POA: Diagnosis not present

## 2015-02-20 DIAGNOSIS — C4442 Squamous cell carcinoma of skin of scalp and neck: Secondary | ICD-10-CM | POA: Diagnosis not present

## 2015-02-20 DIAGNOSIS — C4440 Unspecified malignant neoplasm of skin of scalp and neck: Secondary | ICD-10-CM | POA: Diagnosis not present

## 2015-02-21 DIAGNOSIS — D631 Anemia in chronic kidney disease: Secondary | ICD-10-CM | POA: Diagnosis not present

## 2015-02-21 DIAGNOSIS — N186 End stage renal disease: Secondary | ICD-10-CM | POA: Diagnosis not present

## 2015-02-21 DIAGNOSIS — N2581 Secondary hyperparathyroidism of renal origin: Secondary | ICD-10-CM | POA: Diagnosis not present

## 2015-02-21 DIAGNOSIS — E1129 Type 2 diabetes mellitus with other diabetic kidney complication: Secondary | ICD-10-CM | POA: Diagnosis not present

## 2015-02-21 DIAGNOSIS — D509 Iron deficiency anemia, unspecified: Secondary | ICD-10-CM | POA: Diagnosis not present

## 2015-02-23 DIAGNOSIS — D631 Anemia in chronic kidney disease: Secondary | ICD-10-CM | POA: Diagnosis not present

## 2015-02-23 DIAGNOSIS — N2581 Secondary hyperparathyroidism of renal origin: Secondary | ICD-10-CM | POA: Diagnosis not present

## 2015-02-23 DIAGNOSIS — D509 Iron deficiency anemia, unspecified: Secondary | ICD-10-CM | POA: Diagnosis not present

## 2015-02-23 DIAGNOSIS — N186 End stage renal disease: Secondary | ICD-10-CM | POA: Diagnosis not present

## 2015-02-23 DIAGNOSIS — E1129 Type 2 diabetes mellitus with other diabetic kidney complication: Secondary | ICD-10-CM | POA: Diagnosis not present

## 2015-02-26 DIAGNOSIS — N186 End stage renal disease: Secondary | ICD-10-CM | POA: Diagnosis not present

## 2015-02-26 DIAGNOSIS — N2581 Secondary hyperparathyroidism of renal origin: Secondary | ICD-10-CM | POA: Diagnosis not present

## 2015-02-26 DIAGNOSIS — D631 Anemia in chronic kidney disease: Secondary | ICD-10-CM | POA: Diagnosis not present

## 2015-02-26 DIAGNOSIS — E1129 Type 2 diabetes mellitus with other diabetic kidney complication: Secondary | ICD-10-CM | POA: Diagnosis not present

## 2015-02-26 DIAGNOSIS — D509 Iron deficiency anemia, unspecified: Secondary | ICD-10-CM | POA: Diagnosis not present

## 2015-02-28 DIAGNOSIS — D631 Anemia in chronic kidney disease: Secondary | ICD-10-CM | POA: Diagnosis not present

## 2015-02-28 DIAGNOSIS — N2581 Secondary hyperparathyroidism of renal origin: Secondary | ICD-10-CM | POA: Diagnosis not present

## 2015-02-28 DIAGNOSIS — N186 End stage renal disease: Secondary | ICD-10-CM | POA: Diagnosis not present

## 2015-02-28 DIAGNOSIS — D509 Iron deficiency anemia, unspecified: Secondary | ICD-10-CM | POA: Diagnosis not present

## 2015-02-28 DIAGNOSIS — E1129 Type 2 diabetes mellitus with other diabetic kidney complication: Secondary | ICD-10-CM | POA: Diagnosis not present

## 2015-03-01 ENCOUNTER — Ambulatory Visit (INDEPENDENT_AMBULATORY_CARE_PROVIDER_SITE_OTHER): Payer: Medicare Other | Admitting: Podiatry

## 2015-03-01 ENCOUNTER — Encounter: Payer: Self-pay | Admitting: Podiatry

## 2015-03-01 ENCOUNTER — Ambulatory Visit (INDEPENDENT_AMBULATORY_CARE_PROVIDER_SITE_OTHER): Payer: Medicare Other

## 2015-03-01 VITALS — BP 139/105 | HR 98 | Resp 16 | Ht 75.5 in | Wt 280.0 lb

## 2015-03-01 DIAGNOSIS — M79675 Pain in left toe(s): Secondary | ICD-10-CM

## 2015-03-01 DIAGNOSIS — N186 End stage renal disease: Secondary | ICD-10-CM | POA: Diagnosis not present

## 2015-03-01 DIAGNOSIS — L600 Ingrowing nail: Secondary | ICD-10-CM | POA: Diagnosis not present

## 2015-03-01 DIAGNOSIS — E1129 Type 2 diabetes mellitus with other diabetic kidney complication: Secondary | ICD-10-CM | POA: Diagnosis not present

## 2015-03-01 DIAGNOSIS — Z992 Dependence on renal dialysis: Secondary | ICD-10-CM | POA: Diagnosis not present

## 2015-03-01 NOTE — Progress Notes (Signed)
Subjective:     Patient ID: [name], male   DOB: 09-07-1950, 64 y.o.   MRN: [id number]  HPI patient is in very poor health and stopped his left big toe and is worried that it might be fractured and also the nail is bothering him   Review of Systems  All other systems reviewed and are negative.      Objective:   Physical Exam  Constitutional: He is oriented to person, place, and time.  Musculoskeletal: Normal range of motion.  Neurological: He is oriented to person, place, and time.  Skin: Skin is warm and dry.  Nursing note and vitals reviewed.  diminished pulses DP PT bilateral diminished muscle strength and range of motion. I also noted diminishment in sharp [facility] vibratory and digital perfusion. The left hallux is quite dystrophic and there is no other visible damage and there is damage to the nail itself     Assessment:     Traumatized left hallux with fracture of a possible nature along with traumatized nailbeds    Plan:     H&P and x-rays reviewed. Today I went ahead and removed the nailbed clean the tissue up flushed and applied sterile dressing instructed on soaks. This may be due to difficulty heel due to his long-term health but were in a watch it carefully and also it should heal uneventfully as the underlying bed was not affected

## 2015-03-01 NOTE — Progress Notes (Signed)
   Subjective:    Patient ID: [name], male    DOB: September 07, 1950, 64 y.o.   MRN: [id number]  HPI Patient presents with toe pain in their Left foot, great toe; nail is damaged, pt stated, "toe hit bed 2 weeks ago".  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 Pt is on dialysis.   Review of Systems  All other systems reviewed and are negative.      Objective:   Physical Exam        Assessment & Plan:

## 2015-03-02 DIAGNOSIS — E1129 Type 2 diabetes mellitus with other diabetic kidney complication: Secondary | ICD-10-CM | POA: Diagnosis not present

## 2015-03-02 DIAGNOSIS — N2581 Secondary hyperparathyroidism of renal origin: Secondary | ICD-10-CM | POA: Diagnosis not present

## 2015-03-02 DIAGNOSIS — D509 Iron deficiency anemia, unspecified: Secondary | ICD-10-CM | POA: Diagnosis not present

## 2015-03-02 DIAGNOSIS — D631 Anemia in chronic kidney disease: Secondary | ICD-10-CM | POA: Diagnosis not present

## 2015-03-02 DIAGNOSIS — N186 End stage renal disease: Secondary | ICD-10-CM | POA: Diagnosis not present

## 2015-03-04 ENCOUNTER — Telehealth: Payer: Self-pay | Admitting: Oncology

## 2015-03-04 NOTE — Telephone Encounter (Signed)
new patient appt-s/w patient and gave np appt for 10/07 @ 10:30 w/Dr. Alen Blew Referring Dr. 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

## 2015-03-05 DIAGNOSIS — N186 End stage renal disease: Secondary | ICD-10-CM | POA: Diagnosis not present

## 2015-03-05 DIAGNOSIS — N2581 Secondary hyperparathyroidism of renal origin: Secondary | ICD-10-CM | POA: Diagnosis not present

## 2015-03-05 DIAGNOSIS — E1129 Type 2 diabetes mellitus with other diabetic kidney complication: Secondary | ICD-10-CM | POA: Diagnosis not present

## 2015-03-05 DIAGNOSIS — D631 Anemia in chronic kidney disease: Secondary | ICD-10-CM | POA: Diagnosis not present

## 2015-03-05 DIAGNOSIS — D509 Iron deficiency anemia, unspecified: Secondary | ICD-10-CM | POA: Diagnosis not present

## 2015-03-07 ENCOUNTER — Ambulatory Visit: Payer: Medicare Other | Admitting: Oncology

## 2015-03-07 DIAGNOSIS — N186 End stage renal disease: Secondary | ICD-10-CM | POA: Diagnosis not present

## 2015-03-07 DIAGNOSIS — D631 Anemia in chronic kidney disease: Secondary | ICD-10-CM | POA: Diagnosis not present

## 2015-03-07 DIAGNOSIS — D509 Iron deficiency anemia, unspecified: Secondary | ICD-10-CM | POA: Diagnosis not present

## 2015-03-07 DIAGNOSIS — N2581 Secondary hyperparathyroidism of renal origin: Secondary | ICD-10-CM | POA: Diagnosis not present

## 2015-03-07 DIAGNOSIS — E1129 Type 2 diabetes mellitus with other diabetic kidney complication: Secondary | ICD-10-CM | POA: Diagnosis not present

## 2015-03-08 ENCOUNTER — Telehealth: Payer: Self-pay | Admitting: Oncology

## 2015-03-08 ENCOUNTER — Ambulatory Visit (HOSPITAL_BASED_OUTPATIENT_CLINIC_OR_DEPARTMENT_OTHER): Payer: Medicare Other | Admitting: Oncology

## 2015-03-08 VITALS — BP 103/65 | HR 104 | Temp 97.5°F | Resp 20 | Ht 75.0 in | Wt 271.7 lb

## 2015-03-08 DIAGNOSIS — N186 End stage renal disease: Secondary | ICD-10-CM | POA: Diagnosis not present

## 2015-03-08 DIAGNOSIS — Z8572 Personal history of non-Hodgkin lymphomas: Secondary | ICD-10-CM

## 2015-03-08 DIAGNOSIS — Z992 Dependence on renal dialysis: Secondary | ICD-10-CM

## 2015-03-08 DIAGNOSIS — C801 Malignant (primary) neoplasm, unspecified: Secondary | ICD-10-CM | POA: Diagnosis not present

## 2015-03-08 DIAGNOSIS — E119 Type 2 diabetes mellitus without complications: Secondary | ICD-10-CM

## 2015-03-08 DIAGNOSIS — C4440 Unspecified malignant neoplasm of skin of scalp and neck: Secondary | ICD-10-CM

## 2015-03-08 DIAGNOSIS — C792 Secondary malignant neoplasm of skin: Secondary | ICD-10-CM

## 2015-03-08 NOTE — Telephone Encounter (Signed)
Gave and printed appt sched and avs fo rpt for OCT and NOV...the patient with Dr. Brantley Stage on 10.17 @ 8:50am

## 2015-03-08 NOTE — Consult Note (Signed)
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   HPI: 64 year old gentleman currently of [place] where he lived the majority of his life. He is a pleasant gentleman with history of diabetes, end-stage renal disease with hemodialysis. He has also history of lymphoma and was treated with systemic chemotherapy and have been in remission since the conclusion of chemotherapy in 2010. In May 2016 he reported an enlarging right-sided scalp lesion that is painful and was subsequently evaluated by dermatology in September 2016. During his dermatological exam, he was found to have a firm tender nodule located in the right mid medial parietal scalp. 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 Immunohistochemical staining at that time showed that his TTF-1 was negative and his CD X2 was also negative. Possibilities include head and neck and aerodigestive primary and possible GI tract as well. For that reason patient referred to me for evaluation. Clinically, [name] is a relatively poor health but no acute changes. He received dialysis for end-stage renal disease 3 times a week and [place]. He is able to ambulate with the help of walker without any falls or syncope. He has not reported any other symptoms at this time. He has significant weight loss in the last year and a half associated with his dialysis. He has not reported any smoking history or respiratory symptoms. He has not reported any dysphagia or odynophagia or otalgia.  He does not report any headaches, blurry vision or double vision or neurological deficits. He does report some discomfort around his biopsy site. He does not report any fevers, chills sweats or other constitutional symptoms. He does not report any chest pain, palpitation orthopnea. Does not report any cough or hemoptysis. Does not report any nausea,  vomiting or abdominal pain. He does not report any hematochezia or melanoma. Remaining review of systems unremarkable.   Past Medical History  Diagnosis Date  . DVT (deep venous thrombosis)     "got one in my right leg now; I've had one before too, not sure which leg" (08/05/2013)  . Diabetic retinopathy   . Hypertension   . Hyperlipidemia   . Shortness of breath   . Peripheral vascular disease   . Blood transfusion     "years ago; blood was low" (08/05/2013)  . Noncompliance 03/16/2012  . NSVT (nonsustained ventricular tachycardia) 03/18/2012  . Anemia   . GERD (gastroesophageal reflux disease)     uses alka seltzere on occas.   . Arthritis     HNP- lumbar  . Family history of anesthesia complication     " my son wakes up slowly"  . IDDM (insulin dependent diabetes mellitus)   . Pneumonia 2013    hosp.-   . Sleep apnea     "suppose to have a sleep study, but they never told me when. (08/05/2013)  . Headache(784.0)     "one q now and then" (08/05/2013)  . Diabetic nephropathy   . CKD (chronic kidney disease) stage 4, GFR 15-29 ml/min 03/18/2012  . ESRD (end stage renal disease) on dialysis     "just started today, (08/04/2013)"  . Non Hodgkin's lymphoma     Tx 2009; "had chemo; it went away" (08/05/2013)  . Nodular lymphoma of intra-abdominal lymph nodes   :  Past Surgical History  Procedure Laterality Date  . Portacath placement    . Port-a-cath removal    . Coloscopy    .  Pars plana vitrectomy  08/27/2011    Procedure: PARS PLANA VITRECTOMY WITH 25 GAUGE;  Surgeon: Hayden Pedro, MD;  Location: [facility];  Service: Ophthalmology;  Laterality: Left;  Repair of complex traction retinal detachment left eye  . Eye surgery Bilateral   . Pars plana vitrectomy  05/10/2012    Procedure: PARS PLANA VITRECTOMY WITH 25 GAUGE;  Surgeon: Hayden Pedro, MD;  Location: [facility];  Service: Ophthalmology;  Laterality: Right;  Repair Complex Traction Retinal Detachment  . Membrane peel  05/10/2012     Procedure: MEMBRANE PEEL;  Surgeon: Hayden Pedro, MD;  Location: [place];  Service: Ophthalmology;  Laterality: Right;  . Photocoagulation with laser  05/10/2012    Procedure: PHOTOCOAGULATION WITH LASER;  Surgeon: Hayden Pedro, MD;  Location: [place];  Service: Ophthalmology;  Laterality: Right;  . Gas insertion  05/10/2012    Procedure: INSERTION OF GAS;  Surgeon: Hayden Pedro, MD;  Location: [facility];  Service: Ophthalmology;  Laterality: Right;  . Vena cava filter placement  09/2012    due to preparation for surgery  . Av fistula placement Left 02/03/2013    Procedure: ARTERIOVENOUS (AV) FISTULA CREATION- LEFT RADIAL CEPHALIC; ULTRASOUND GUIDED;  Surgeon: Mal Misty, MD;  Location: [facility];  Service: Vascular;  Laterality: Left;  . Cardiac catheterization    :   Current outpatient prescriptions:  .  acetaminophen (TYLENOL) 325 MG tablet, Take 650 mg by mouth every 6 (six) hours as needed (pain)., Disp: , Rfl:  .  CARTIA XT 120 MG 24 hr capsule, Take 120 mg by mouth daily., Disp: , Rfl:  .  doxycycline (VIBRAMYCIN) 100 MG capsule, Take 1 capsule (100 mg total) by mouth 2 (two) times daily. One po bid x 7 days, Disp: 14 capsule, Rfl: 0 .  insulin glargine (LANTUS) 100 UNIT/ML injection, Inject 0.3 mLs (30 Units total) into the skin at bedtime., Disp: 10 mL, Rfl: 11 .  insulin lispro (HUMALOG) 100 UNIT/ML injection, Inject 10 Units into the skin 3 (three) times daily with meals. , Disp: , Rfl:  .  multivitamin (RENA-VIT) TABS tablet, Take 1 tablet by mouth at bedtime., Disp: 30 tablet, Rfl: 0 .  polysaccharide iron (NIFEREX) 150 MG CAPS capsule, Take 1 capsule (150 mg total) by mouth daily., Disp: 30 each, Rfl: 2 .  sevelamer carbonate (RENVELA) 800 MG tablet, Take 1,600 mg by mouth 3 (three) times daily with meals., Disp: , Rfl:  .  warfarin (COUMADIN) 5 MG tablet, Take 10 mg by mouth every evening. , Disp: , Rfl: :  No Known Allergies:  Family History  Problem Relation Age of Onset   . Anesthesia problems Son   . Hypertension Son   . Diabetes Father   . Hypertension Father   . Other Father     amputation  :  Social History   Social History  . Marital Status: Single    Spouse Name: N/A  . Number of Children: N/A  . Years of Education: N/A   Occupational History  . Not on file.   Social History Main Topics  . Smoking status: Never Smoker   . Smokeless tobacco: Never Used  . Alcohol Use: No  . Drug Use: No  . Sexual Activity: Not Currently   Other Topics Concern  . Not on file   Social History Narrative  :  Pertinent items are noted in HPI.  Exam: ECOG 1 Blood pressure 103/65, pulse 104, temperature 97.5 F (36.4  C), temperature source Oral, resp. rate 20, height 6\' 3"  (1.905 m), weight 271 lb 11.2 oz (123.242 kg), SpO2 100 %. General appearance: alert and cooperative chronically ill-appearing without distress today. Head: Normocephalic, without obvious abnormality. The scalp nodule noted on the left side. No erythema or induration. Nose: Nares normal. Septum midline. Mucosa normal. No drainage or sinus tenderness. Throat: lips, mucosa, and tongue normal; teeth and gums normal no oral ulcers. Neck: no adenopathy Back: negative Resp: clear to auscultation bilaterally Chest wall: no tenderness Cardio: regular rate and rhythm, S1, S2 normal, no murmur, click, rub or gallop GI: soft, non-tender; bowel sounds normal; no masses,  no organomegaly Extremities: extremities normal, atraumatic, no cyanosis or edema Pulses: 2+ and symmetric Skin: Skin color, texture, turgor normal. No rashes or lesions no other skin rashes or lesions.   CBC    Component Value Date/Time   WBC 3.9* 10/28/2014 0955   WBC 5.2 09/29/2011 1230   RBC 3.64* 10/28/2014 0955   RBC 3.75* 12/28/2012 0524   RBC 3.49* 09/29/2011 1230   HGB 10.5* 10/28/2014 0955   HGB 9.4* 09/29/2011 1230   HCT 32.6* 10/28/2014 0955   HCT 28.9* 09/29/2011 1230   PLT 175 10/28/2014 0955   PLT  230 09/29/2011 1230   MCV 89.6 10/28/2014 0955   MCV 82.6 09/29/2011 1230   MCH 28.8 10/28/2014 0955   MCH 27.0* 09/29/2011 1230   MCHC 32.2 10/28/2014 0955   MCHC 32.6 09/29/2011 1230   RDW 17.8* 10/28/2014 0955   RDW 17.2* 09/29/2011 1230   LYMPHSABS 1.1 10/28/2014 0955   LYMPHSABS 1.2 09/29/2011 1230   MONOABS 0.1 10/28/2014 0955   MONOABS 0.5 09/29/2011 1230   EOSABS 0.1 10/28/2014 0955   EOSABS 0.2 09/29/2011 1230   BASOSABS 0.0 10/28/2014 0955   BASOSABS 0.0 09/29/2011 1230      Assessment and Plan:   64 year old gentleman with the following issues:  1. 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 This was detected on a scalp nodule and raises suspicious for metastatic carcinoma from a lung/aerodigestive track. He does not have any specific localizing symptoms at this time but certainly he is at risk of developing metastatic malignancy.   I will obtain a PET CT scan to determine if there is any primary site of concern whether it is lung, head and neck or esophageal. Depending on the PET scan findings further evaluation such as endoscopy or bronchoscopy might be needed.  If no other primary site is noted, surgical resection of his primary scalp lesion might be needed and possibly followed by adjuvant radiation therapy. I will refer him to Gen. surgery for possible evaluation and resection.  Further systemic therapy might be needed depending on results of his PET scan.  2. History of lymphoma: This has been in remission last 6 years at least. See no signs or symptoms of relapse at this time.

## 2015-03-08 NOTE — Consult Note (Signed)
Please see consult note.  

## 2015-03-08 NOTE — Progress Notes (Signed)
Please see consult note.  

## 2015-03-09 DIAGNOSIS — D509 Iron deficiency anemia, unspecified: Secondary | ICD-10-CM | POA: Diagnosis not present

## 2015-03-09 DIAGNOSIS — N2581 Secondary hyperparathyroidism of renal origin: Secondary | ICD-10-CM | POA: Diagnosis not present

## 2015-03-09 DIAGNOSIS — N186 End stage renal disease: Secondary | ICD-10-CM | POA: Diagnosis not present

## 2015-03-09 DIAGNOSIS — D631 Anemia in chronic kidney disease: Secondary | ICD-10-CM | POA: Diagnosis not present

## 2015-03-09 DIAGNOSIS — E1129 Type 2 diabetes mellitus with other diabetic kidney complication: Secondary | ICD-10-CM | POA: Diagnosis not present

## 2015-03-12 DIAGNOSIS — E1129 Type 2 diabetes mellitus with other diabetic kidney complication: Secondary | ICD-10-CM | POA: Diagnosis not present

## 2015-03-12 DIAGNOSIS — D631 Anemia in chronic kidney disease: Secondary | ICD-10-CM | POA: Diagnosis not present

## 2015-03-12 DIAGNOSIS — N186 End stage renal disease: Secondary | ICD-10-CM | POA: Diagnosis not present

## 2015-03-12 DIAGNOSIS — N2581 Secondary hyperparathyroidism of renal origin: Secondary | ICD-10-CM | POA: Diagnosis not present

## 2015-03-12 DIAGNOSIS — D509 Iron deficiency anemia, unspecified: Secondary | ICD-10-CM | POA: Diagnosis not present

## 2015-03-13 DIAGNOSIS — Z992 Dependence on renal dialysis: Secondary | ICD-10-CM | POA: Diagnosis not present

## 2015-03-13 DIAGNOSIS — I871 Compression of vein: Secondary | ICD-10-CM | POA: Diagnosis not present

## 2015-03-13 DIAGNOSIS — N186 End stage renal disease: Secondary | ICD-10-CM | POA: Diagnosis not present

## 2015-03-13 DIAGNOSIS — T82858D Stenosis of vascular prosthetic devices, implants and grafts, subsequent encounter: Secondary | ICD-10-CM | POA: Diagnosis not present

## 2015-03-14 DIAGNOSIS — N186 End stage renal disease: Secondary | ICD-10-CM | POA: Diagnosis not present

## 2015-03-14 DIAGNOSIS — D631 Anemia in chronic kidney disease: Secondary | ICD-10-CM | POA: Diagnosis not present

## 2015-03-14 DIAGNOSIS — N2581 Secondary hyperparathyroidism of renal origin: Secondary | ICD-10-CM | POA: Diagnosis not present

## 2015-03-14 DIAGNOSIS — D509 Iron deficiency anemia, unspecified: Secondary | ICD-10-CM | POA: Diagnosis not present

## 2015-03-14 DIAGNOSIS — E1129 Type 2 diabetes mellitus with other diabetic kidney complication: Secondary | ICD-10-CM | POA: Diagnosis not present

## 2015-03-16 DIAGNOSIS — N2581 Secondary hyperparathyroidism of renal origin: Secondary | ICD-10-CM | POA: Diagnosis not present

## 2015-03-16 DIAGNOSIS — D509 Iron deficiency anemia, unspecified: Secondary | ICD-10-CM | POA: Diagnosis not present

## 2015-03-16 DIAGNOSIS — D631 Anemia in chronic kidney disease: Secondary | ICD-10-CM | POA: Diagnosis not present

## 2015-03-16 DIAGNOSIS — E1129 Type 2 diabetes mellitus with other diabetic kidney complication: Secondary | ICD-10-CM | POA: Diagnosis not present

## 2015-03-16 DIAGNOSIS — N186 End stage renal disease: Secondary | ICD-10-CM | POA: Diagnosis not present

## 2015-03-18 ENCOUNTER — Ambulatory Visit: Payer: Self-pay | Admitting: Surgery

## 2015-03-18 DIAGNOSIS — C799 Secondary malignant neoplasm of unspecified site: Secondary | ICD-10-CM | POA: Diagnosis not present

## 2015-03-18 NOTE — H&P (Signed)
[name] 03/18/2015 9:41 AM Location: [facility] Patient #: [id number] DOB: [birth year] Single / Language: [name] / Race: Black or African American Male  History of Present Illness [name]; 03/18/2015 12:14 PM) Patient words: scalp carcinoma Per Dr [name] of oncology   64 year old gentleman with the following issues:  1. 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 This was detected on a scalp nodule and raises suspicious for metastatic carcinoma from a lung/aerodigestive track. He does not have any specific localizing symptoms at this time but certainly he is at risk of developing metastatic malignancy.  I will obtain a PET CT scan to determine if there is any primary site of concern whether it is lung, head and neck or esophageal. Depending on the PET scan findings further evaluation such as endoscopy or bronchoscopy might be needed.   Pt presents to discuss excision of 3 small 1 cm scalp masses right frontal region. Core biopsy shows metastatic carcinoma source unknomw. Getting a PET scan this week. Has multiple medical problems. Found during yearly physical.  The patient is a 64 year old male.   Other Problems [name], [name]; 03/18/2015 9:42 AM) Diabetes Mellitus Gastroesophageal Reflux Disease  Past Surgical History [name], [name]; 03/18/2015 9:42 AM) Dialysis Shunt / Fistula  Diagnostic Studies History [name], [name]; 03/18/2015 9:42 AM) Colonoscopy 5-10 years ago  Allergies [name], [name]; 03/18/2015 9:42 AM) No Known Drug Allergies 03/18/2015  Medication History [name], [name]; 03/18/2015 9:43 AM) [name] (120MG  Capsule ER 24HR, Oral) Active. Doxycycline Hyclate (100MG  Capsule, Oral) Active. Warfarin Sodium (5MG  Tablet, Oral) Active. Tylenol (325MG  Tablet, Oral) Active. Insulin Zinc (100UNIT/ML Suspension, Subcutaneous)  Active. Multi Vitamin Daily (Oral) Active. [name] (150MG  Capsule, Oral) Active. Renvela (800MG  Tablet, Oral) Active.  Social History [name], [name]; 03/18/2015 9:42 AM) Alcohol use Remotely quit alcohol use. Caffeine use Carbonated beverages. No drug use Tobacco use Never smoker.  Family History [name], [name]; 03/18/2015 9:42 AM) Diabetes Mellitus Father. Migraine Headache Son.     Review of Systems ([name] [name]; 03/18/2015 9:42 AM) General Present- Fatigue and Weight Loss. Not Present- Appetite Loss, Chills, Fever, Night Sweats and Weight Gain. Skin Present- Dryness and Non-Healing Wounds. Not Present- Change in Wart/Mole, Hives, Jaundice, New Lesions, Rash and Ulcer. HEENT Present- Wears glasses/contact lenses. Not Present- Earache, Hearing Loss, Hoarseness, Nose Bleed, Oral Ulcers, Ringing in the Ears, Seasonal Allergies, Sinus Pain, Sore Throat, Visual Disturbances and Yellow Eyes. Respiratory Present- Chronic Cough. Not Present- Bloody sputum, Difficulty Breathing, Snoring and Wheezing. Musculoskeletal Present- Muscle Weakness. Not Present- Back Pain, Joint Pain, Joint Stiffness, Muscle Pain and Swelling of Extremities. Neurological Present- Headaches. Not Present- Decreased Memory, Fainting, Numbness, Seizures, Tingling, Tremor, Trouble walking and Weakness. Hematology Present- Excessive bleeding. Not Present- Easy Bruising, Gland problems, HIV and Persistent Infections.  Vitals [name] [name]; 03/18/2015 9:41 AM) 03/18/2015 9:41 AM Weight: 274.38 lb Height: 75.5in Body Surface Area: 2.52 m Body Mass Index: 33.84 kg/m  Pulse: 76 (Regular)  BP: 132/68 (Sitting, Left Arm, Standard)      Physical Exam (John Egelhoff A. Johnston Maddocks [name]; 03/18/2015 10:05 AM)  Integumentary Note: left arm graft  Head and Neck Note: 3 cm linerar masses right frontal region 3 1 cm masses   Eye Eyeball - Bilateral-Extraocular  movements intact. Sclera/Conjunctiva - Bilateral-No scleral icterus.  Chest and Lung Exam Chest and lung exam reveals -quiet, even and easy respiratory effort with  no use of accessory muscles and on auscultation, normal breath sounds, no adventitious sounds and normal vocal resonance. Inspection Chest Wall - Normal. Back - normal.  Cardiovascular Cardiovascular examination reveals -on palpation PMI is normal in location and amplitude, no palpable S3 or S4. Normal cardiac borders., normal heart sounds, regular rate and rhythm with no murmurs, carotid auscultation reveals no bruits and normal pedal pulses bilaterally.  Neurologic Neurologic evaluation reveals -alert and oriented x 3 with no impairment of recent or remote memory. Mental Status-Normal.    Assessment & Plan (John Colombe A. 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 Pt Education - [facility] Free Text Education/Instructions: discussed with patient and provided information. The anatomy and the physiology was discussed. The pathophysiology and natural history of the disease was discussed. Options were discussed and recommendations were made. Technique, risks, benefits, & alternatives were discussed. Risks such as stroke, heart attack, bleeding, indection, death, and other risks discussed. Questions answered. The patient agrees to proceed.

## 2015-03-19 DIAGNOSIS — N2581 Secondary hyperparathyroidism of renal origin: Secondary | ICD-10-CM | POA: Diagnosis not present

## 2015-03-19 DIAGNOSIS — E1129 Type 2 diabetes mellitus with other diabetic kidney complication: Secondary | ICD-10-CM | POA: Diagnosis not present

## 2015-03-19 DIAGNOSIS — N186 End stage renal disease: Secondary | ICD-10-CM | POA: Diagnosis not present

## 2015-03-19 DIAGNOSIS — D509 Iron deficiency anemia, unspecified: Secondary | ICD-10-CM | POA: Diagnosis not present

## 2015-03-19 DIAGNOSIS — D631 Anemia in chronic kidney disease: Secondary | ICD-10-CM | POA: Diagnosis not present

## 2015-03-21 DIAGNOSIS — D631 Anemia in chronic kidney disease: Secondary | ICD-10-CM | POA: Diagnosis not present

## 2015-03-21 DIAGNOSIS — N2581 Secondary hyperparathyroidism of renal origin: Secondary | ICD-10-CM | POA: Diagnosis not present

## 2015-03-21 DIAGNOSIS — E1129 Type 2 diabetes mellitus with other diabetic kidney complication: Secondary | ICD-10-CM | POA: Diagnosis not present

## 2015-03-21 DIAGNOSIS — N186 End stage renal disease: Secondary | ICD-10-CM | POA: Diagnosis not present

## 2015-03-21 DIAGNOSIS — D509 Iron deficiency anemia, unspecified: Secondary | ICD-10-CM | POA: Diagnosis not present

## 2015-03-22 ENCOUNTER — Ambulatory Visit (HOSPITAL_COMMUNITY)
Admission: RE | Admit: 2015-03-22 | Discharge: 2015-03-22 | Disposition: A | Discharge status: Home / Self Care | Payer: Medicare Other | Source: Ambulatory Visit | Attending: Oncology | Admitting: Oncology

## 2015-03-22 DIAGNOSIS — K8020 Calculus of gallbladder without cholecystitis without obstruction: Secondary | ICD-10-CM | POA: Diagnosis not present

## 2015-03-22 DIAGNOSIS — C792 Secondary malignant neoplasm of skin: Secondary | ICD-10-CM | POA: Diagnosis not present

## 2015-03-22 DIAGNOSIS — C4440 Unspecified malignant neoplasm of skin of scalp and neck: Secondary | ICD-10-CM

## 2015-03-22 DIAGNOSIS — R591 Generalized enlarged lymph nodes: Secondary | ICD-10-CM | POA: Insufficient documentation

## 2015-03-22 DIAGNOSIS — I700 Atherosclerosis of aorta: Secondary | ICD-10-CM | POA: Diagnosis not present

## 2015-03-22 DIAGNOSIS — I517 Cardiomegaly: Secondary | ICD-10-CM | POA: Diagnosis not present

## 2015-03-22 DIAGNOSIS — Z8572 Personal history of non-Hodgkin lymphomas: Secondary | ICD-10-CM | POA: Diagnosis not present

## 2015-03-22 DIAGNOSIS — Z9221 Personal history of antineoplastic chemotherapy: Secondary | ICD-10-CM | POA: Insufficient documentation

## 2015-03-22 DIAGNOSIS — R188 Other ascites: Secondary | ICD-10-CM | POA: Diagnosis not present

## 2015-03-22 DIAGNOSIS — I2510 Atherosclerotic heart disease of native coronary artery without angina pectoris: Secondary | ICD-10-CM | POA: Insufficient documentation

## 2015-03-22 LAB — GLUCOSE, CAPILLARY: Glucose-Capillary: 62 mg/dL — ABNORMAL LOW (ref 65–99)

## 2015-03-22 MED ORDER — FLUDEOXYGLUCOSE F - 18 (FDG) INJECTION
13.5000 | Freq: Once | INTRAVENOUS | Status: DC | PRN
  Administered 2015-03-22: 13.5 via INTRAVENOUS
  Filled 2015-03-22: qty 13.5

## 2015-03-23 DIAGNOSIS — N186 End stage renal disease: Secondary | ICD-10-CM | POA: Diagnosis not present

## 2015-03-23 DIAGNOSIS — D509 Iron deficiency anemia, unspecified: Secondary | ICD-10-CM | POA: Diagnosis not present

## 2015-03-23 DIAGNOSIS — D631 Anemia in chronic kidney disease: Secondary | ICD-10-CM | POA: Diagnosis not present

## 2015-03-23 DIAGNOSIS — E1129 Type 2 diabetes mellitus with other diabetic kidney complication: Secondary | ICD-10-CM | POA: Diagnosis not present

## 2015-03-23 DIAGNOSIS — N2581 Secondary hyperparathyroidism of renal origin: Secondary | ICD-10-CM | POA: Diagnosis not present

## 2015-03-26 DIAGNOSIS — D509 Iron deficiency anemia, unspecified: Secondary | ICD-10-CM | POA: Diagnosis not present

## 2015-03-26 DIAGNOSIS — N2581 Secondary hyperparathyroidism of renal origin: Secondary | ICD-10-CM | POA: Diagnosis not present

## 2015-03-26 DIAGNOSIS — E1129 Type 2 diabetes mellitus with other diabetic kidney complication: Secondary | ICD-10-CM | POA: Diagnosis not present

## 2015-03-26 DIAGNOSIS — D631 Anemia in chronic kidney disease: Secondary | ICD-10-CM | POA: Diagnosis not present

## 2015-03-26 DIAGNOSIS — N186 End stage renal disease: Secondary | ICD-10-CM | POA: Diagnosis not present

## 2015-03-28 ENCOUNTER — Ambulatory Visit: Payer: Self-pay | Admitting: Surgery

## 2015-03-28 DIAGNOSIS — E1129 Type 2 diabetes mellitus with other diabetic kidney complication: Secondary | ICD-10-CM | POA: Diagnosis not present

## 2015-03-28 DIAGNOSIS — N2581 Secondary hyperparathyroidism of renal origin: Secondary | ICD-10-CM | POA: Diagnosis not present

## 2015-03-28 DIAGNOSIS — D509 Iron deficiency anemia, unspecified: Secondary | ICD-10-CM | POA: Diagnosis not present

## 2015-03-28 DIAGNOSIS — N186 End stage renal disease: Secondary | ICD-10-CM | POA: Diagnosis not present

## 2015-03-28 DIAGNOSIS — D631 Anemia in chronic kidney disease: Secondary | ICD-10-CM | POA: Diagnosis not present

## 2015-03-30 DIAGNOSIS — N186 End stage renal disease: Secondary | ICD-10-CM | POA: Diagnosis not present

## 2015-03-30 DIAGNOSIS — N2581 Secondary hyperparathyroidism of renal origin: Secondary | ICD-10-CM | POA: Diagnosis not present

## 2015-03-30 DIAGNOSIS — D509 Iron deficiency anemia, unspecified: Secondary | ICD-10-CM | POA: Diagnosis not present

## 2015-03-30 DIAGNOSIS — E1129 Type 2 diabetes mellitus with other diabetic kidney complication: Secondary | ICD-10-CM | POA: Diagnosis not present

## 2015-03-30 DIAGNOSIS — D631 Anemia in chronic kidney disease: Secondary | ICD-10-CM | POA: Diagnosis not present

## 2015-04-01 DIAGNOSIS — Z992 Dependence on renal dialysis: Secondary | ICD-10-CM | POA: Diagnosis not present

## 2015-04-01 DIAGNOSIS — E1129 Type 2 diabetes mellitus with other diabetic kidney complication: Secondary | ICD-10-CM | POA: Diagnosis not present

## 2015-04-01 DIAGNOSIS — N186 End stage renal disease: Secondary | ICD-10-CM | POA: Diagnosis not present

## 2015-04-02 DIAGNOSIS — N2581 Secondary hyperparathyroidism of renal origin: Secondary | ICD-10-CM | POA: Diagnosis not present

## 2015-04-02 DIAGNOSIS — D509 Iron deficiency anemia, unspecified: Secondary | ICD-10-CM | POA: Diagnosis not present

## 2015-04-02 DIAGNOSIS — D631 Anemia in chronic kidney disease: Secondary | ICD-10-CM | POA: Diagnosis not present

## 2015-04-02 DIAGNOSIS — N186 End stage renal disease: Secondary | ICD-10-CM | POA: Diagnosis not present

## 2015-04-02 DIAGNOSIS — E1129 Type 2 diabetes mellitus with other diabetic kidney complication: Secondary | ICD-10-CM | POA: Diagnosis not present

## 2015-04-04 DIAGNOSIS — E1129 Type 2 diabetes mellitus with other diabetic kidney complication: Secondary | ICD-10-CM | POA: Diagnosis not present

## 2015-04-04 DIAGNOSIS — N2581 Secondary hyperparathyroidism of renal origin: Secondary | ICD-10-CM | POA: Diagnosis not present

## 2015-04-04 DIAGNOSIS — D509 Iron deficiency anemia, unspecified: Secondary | ICD-10-CM | POA: Diagnosis not present

## 2015-04-04 DIAGNOSIS — D631 Anemia in chronic kidney disease: Secondary | ICD-10-CM | POA: Diagnosis not present

## 2015-04-04 DIAGNOSIS — N186 End stage renal disease: Secondary | ICD-10-CM | POA: Diagnosis not present

## 2015-04-06 DIAGNOSIS — N2581 Secondary hyperparathyroidism of renal origin: Secondary | ICD-10-CM | POA: Diagnosis not present

## 2015-04-06 DIAGNOSIS — D509 Iron deficiency anemia, unspecified: Secondary | ICD-10-CM | POA: Diagnosis not present

## 2015-04-06 DIAGNOSIS — E1129 Type 2 diabetes mellitus with other diabetic kidney complication: Secondary | ICD-10-CM | POA: Diagnosis not present

## 2015-04-06 DIAGNOSIS — D631 Anemia in chronic kidney disease: Secondary | ICD-10-CM | POA: Diagnosis not present

## 2015-04-06 DIAGNOSIS — N186 End stage renal disease: Secondary | ICD-10-CM | POA: Diagnosis not present

## 2015-04-09 DIAGNOSIS — N2581 Secondary hyperparathyroidism of renal origin: Secondary | ICD-10-CM | POA: Diagnosis not present

## 2015-04-09 DIAGNOSIS — D509 Iron deficiency anemia, unspecified: Secondary | ICD-10-CM | POA: Diagnosis not present

## 2015-04-09 DIAGNOSIS — D631 Anemia in chronic kidney disease: Secondary | ICD-10-CM | POA: Diagnosis not present

## 2015-04-09 DIAGNOSIS — N186 End stage renal disease: Secondary | ICD-10-CM | POA: Diagnosis not present

## 2015-04-09 DIAGNOSIS — E1129 Type 2 diabetes mellitus with other diabetic kidney complication: Secondary | ICD-10-CM | POA: Diagnosis not present

## 2015-04-11 DIAGNOSIS — D631 Anemia in chronic kidney disease: Secondary | ICD-10-CM | POA: Diagnosis not present

## 2015-04-11 DIAGNOSIS — N2581 Secondary hyperparathyroidism of renal origin: Secondary | ICD-10-CM | POA: Diagnosis not present

## 2015-04-11 DIAGNOSIS — D509 Iron deficiency anemia, unspecified: Secondary | ICD-10-CM | POA: Diagnosis not present

## 2015-04-11 DIAGNOSIS — E1129 Type 2 diabetes mellitus with other diabetic kidney complication: Secondary | ICD-10-CM | POA: Diagnosis not present

## 2015-04-11 DIAGNOSIS — N186 End stage renal disease: Secondary | ICD-10-CM | POA: Diagnosis not present

## 2015-04-13 DIAGNOSIS — D631 Anemia in chronic kidney disease: Secondary | ICD-10-CM | POA: Diagnosis not present

## 2015-04-13 DIAGNOSIS — D509 Iron deficiency anemia, unspecified: Secondary | ICD-10-CM | POA: Diagnosis not present

## 2015-04-13 DIAGNOSIS — N2581 Secondary hyperparathyroidism of renal origin: Secondary | ICD-10-CM | POA: Diagnosis not present

## 2015-04-13 DIAGNOSIS — N186 End stage renal disease: Secondary | ICD-10-CM | POA: Diagnosis not present

## 2015-04-13 DIAGNOSIS — E1129 Type 2 diabetes mellitus with other diabetic kidney complication: Secondary | ICD-10-CM | POA: Diagnosis not present

## 2015-04-16 DIAGNOSIS — E1129 Type 2 diabetes mellitus with other diabetic kidney complication: Secondary | ICD-10-CM | POA: Diagnosis not present

## 2015-04-16 DIAGNOSIS — D509 Iron deficiency anemia, unspecified: Secondary | ICD-10-CM | POA: Diagnosis not present

## 2015-04-16 DIAGNOSIS — N186 End stage renal disease: Secondary | ICD-10-CM | POA: Diagnosis not present

## 2015-04-16 DIAGNOSIS — D631 Anemia in chronic kidney disease: Secondary | ICD-10-CM | POA: Diagnosis not present

## 2015-04-16 DIAGNOSIS — N2581 Secondary hyperparathyroidism of renal origin: Secondary | ICD-10-CM | POA: Diagnosis not present

## 2015-04-18 DIAGNOSIS — N186 End stage renal disease: Secondary | ICD-10-CM | POA: Diagnosis not present

## 2015-04-18 DIAGNOSIS — D631 Anemia in chronic kidney disease: Secondary | ICD-10-CM | POA: Diagnosis not present

## 2015-04-18 DIAGNOSIS — E1129 Type 2 diabetes mellitus with other diabetic kidney complication: Secondary | ICD-10-CM | POA: Diagnosis not present

## 2015-04-18 DIAGNOSIS — D509 Iron deficiency anemia, unspecified: Secondary | ICD-10-CM | POA: Diagnosis not present

## 2015-04-18 DIAGNOSIS — N2581 Secondary hyperparathyroidism of renal origin: Secondary | ICD-10-CM | POA: Diagnosis not present

## 2015-04-20 DIAGNOSIS — N186 End stage renal disease: Secondary | ICD-10-CM | POA: Diagnosis not present

## 2015-04-20 DIAGNOSIS — D631 Anemia in chronic kidney disease: Secondary | ICD-10-CM | POA: Diagnosis not present

## 2015-04-20 DIAGNOSIS — D509 Iron deficiency anemia, unspecified: Secondary | ICD-10-CM | POA: Diagnosis not present

## 2015-04-20 DIAGNOSIS — N2581 Secondary hyperparathyroidism of renal origin: Secondary | ICD-10-CM | POA: Diagnosis not present

## 2015-04-20 DIAGNOSIS — E1129 Type 2 diabetes mellitus with other diabetic kidney complication: Secondary | ICD-10-CM | POA: Diagnosis not present

## 2015-04-22 DIAGNOSIS — N2581 Secondary hyperparathyroidism of renal origin: Secondary | ICD-10-CM | POA: Diagnosis not present

## 2015-04-22 DIAGNOSIS — N186 End stage renal disease: Secondary | ICD-10-CM | POA: Diagnosis not present

## 2015-04-22 DIAGNOSIS — E1129 Type 2 diabetes mellitus with other diabetic kidney complication: Secondary | ICD-10-CM | POA: Diagnosis not present

## 2015-04-22 DIAGNOSIS — D509 Iron deficiency anemia, unspecified: Secondary | ICD-10-CM | POA: Diagnosis not present

## 2015-04-22 DIAGNOSIS — D631 Anemia in chronic kidney disease: Secondary | ICD-10-CM | POA: Diagnosis not present

## 2015-04-24 DIAGNOSIS — D509 Iron deficiency anemia, unspecified: Secondary | ICD-10-CM | POA: Diagnosis not present

## 2015-04-24 DIAGNOSIS — N186 End stage renal disease: Secondary | ICD-10-CM | POA: Diagnosis not present

## 2015-04-24 DIAGNOSIS — D631 Anemia in chronic kidney disease: Secondary | ICD-10-CM | POA: Diagnosis not present

## 2015-04-24 DIAGNOSIS — E1129 Type 2 diabetes mellitus with other diabetic kidney complication: Secondary | ICD-10-CM | POA: Diagnosis not present

## 2015-04-24 DIAGNOSIS — N2581 Secondary hyperparathyroidism of renal origin: Secondary | ICD-10-CM | POA: Diagnosis not present

## 2015-04-27 DIAGNOSIS — D631 Anemia in chronic kidney disease: Secondary | ICD-10-CM | POA: Diagnosis not present

## 2015-04-27 DIAGNOSIS — D509 Iron deficiency anemia, unspecified: Secondary | ICD-10-CM | POA: Diagnosis not present

## 2015-04-27 DIAGNOSIS — N2581 Secondary hyperparathyroidism of renal origin: Secondary | ICD-10-CM | POA: Diagnosis not present

## 2015-04-27 DIAGNOSIS — N186 End stage renal disease: Secondary | ICD-10-CM | POA: Diagnosis not present

## 2015-04-27 DIAGNOSIS — E1129 Type 2 diabetes mellitus with other diabetic kidney complication: Secondary | ICD-10-CM | POA: Diagnosis not present

## 2015-04-30 DIAGNOSIS — E1129 Type 2 diabetes mellitus with other diabetic kidney complication: Secondary | ICD-10-CM | POA: Diagnosis not present

## 2015-04-30 DIAGNOSIS — N186 End stage renal disease: Secondary | ICD-10-CM | POA: Diagnosis not present

## 2015-04-30 DIAGNOSIS — D631 Anemia in chronic kidney disease: Secondary | ICD-10-CM | POA: Diagnosis not present

## 2015-04-30 DIAGNOSIS — D509 Iron deficiency anemia, unspecified: Secondary | ICD-10-CM | POA: Diagnosis not present

## 2015-04-30 DIAGNOSIS — N2581 Secondary hyperparathyroidism of renal origin: Secondary | ICD-10-CM | POA: Diagnosis not present

## 2015-05-01 ENCOUNTER — Ambulatory Visit: Payer: Medicare Other | Admitting: Oncology

## 2015-05-01 DIAGNOSIS — N186 End stage renal disease: Secondary | ICD-10-CM | POA: Diagnosis not present

## 2015-05-01 DIAGNOSIS — E1129 Type 2 diabetes mellitus with other diabetic kidney complication: Secondary | ICD-10-CM | POA: Diagnosis not present

## 2015-05-01 DIAGNOSIS — Z992 Dependence on renal dialysis: Secondary | ICD-10-CM | POA: Diagnosis not present

## 2015-05-02 ENCOUNTER — Other Ambulatory Visit (HOSPITAL_COMMUNITY): Payer: Medicare Other

## 2015-05-02 DIAGNOSIS — E1129 Type 2 diabetes mellitus with other diabetic kidney complication: Secondary | ICD-10-CM | POA: Diagnosis not present

## 2015-05-02 DIAGNOSIS — N2581 Secondary hyperparathyroidism of renal origin: Secondary | ICD-10-CM | POA: Diagnosis not present

## 2015-05-02 DIAGNOSIS — D509 Iron deficiency anemia, unspecified: Secondary | ICD-10-CM | POA: Diagnosis not present

## 2015-05-02 DIAGNOSIS — D631 Anemia in chronic kidney disease: Secondary | ICD-10-CM | POA: Diagnosis not present

## 2015-05-02 DIAGNOSIS — E876 Hypokalemia: Secondary | ICD-10-CM | POA: Diagnosis not present

## 2015-05-02 DIAGNOSIS — N186 End stage renal disease: Secondary | ICD-10-CM | POA: Diagnosis not present

## 2015-05-03 ENCOUNTER — Other Ambulatory Visit: Payer: Self-pay

## 2015-05-03 ENCOUNTER — Encounter (HOSPITAL_COMMUNITY): Payer: Self-pay

## 2015-05-03 ENCOUNTER — Encounter (HOSPITAL_COMMUNITY)
Admission: RE | Admit: 2015-05-03 | Discharge: 2015-05-03 | Disposition: A | Discharge status: Home / Self Care | Payer: Medicare Other | Source: Ambulatory Visit | Attending: Surgery | Admitting: Surgery

## 2015-05-03 DIAGNOSIS — Z6833 Body mass index (BMI) 33.0-33.9, adult: Secondary | ICD-10-CM | POA: Diagnosis not present

## 2015-05-03 DIAGNOSIS — Z7901 Long term (current) use of anticoagulants: Secondary | ICD-10-CM | POA: Insufficient documentation

## 2015-05-03 DIAGNOSIS — C8590 Non-Hodgkin lymphoma, unspecified, unspecified site: Secondary | ICD-10-CM | POA: Diagnosis not present

## 2015-05-03 DIAGNOSIS — Z992 Dependence on renal dialysis: Secondary | ICD-10-CM | POA: Diagnosis not present

## 2015-05-03 DIAGNOSIS — I4510 Unspecified right bundle-branch block: Secondary | ICD-10-CM | POA: Insufficient documentation

## 2015-05-03 DIAGNOSIS — G4733 Obstructive sleep apnea (adult) (pediatric): Secondary | ICD-10-CM | POA: Insufficient documentation

## 2015-05-03 DIAGNOSIS — Z86718 Personal history of other venous thrombosis and embolism: Secondary | ICD-10-CM | POA: Insufficient documentation

## 2015-05-03 DIAGNOSIS — C4490 Unspecified malignant neoplasm of skin, unspecified: Secondary | ICD-10-CM | POA: Diagnosis not present

## 2015-05-03 DIAGNOSIS — K219 Gastro-esophageal reflux disease without esophagitis: Secondary | ICD-10-CM | POA: Diagnosis not present

## 2015-05-03 DIAGNOSIS — E1122 Type 2 diabetes mellitus with diabetic chronic kidney disease: Secondary | ICD-10-CM | POA: Insufficient documentation

## 2015-05-03 DIAGNOSIS — Z01812 Encounter for preprocedural laboratory examination: Secondary | ICD-10-CM | POA: Diagnosis not present

## 2015-05-03 DIAGNOSIS — I120 Hypertensive chronic kidney disease with stage 5 chronic kidney disease or end stage renal disease: Secondary | ICD-10-CM | POA: Insufficient documentation

## 2015-05-03 DIAGNOSIS — E669 Obesity, unspecified: Secondary | ICD-10-CM | POA: Diagnosis not present

## 2015-05-03 DIAGNOSIS — Z79899 Other long term (current) drug therapy: Secondary | ICD-10-CM | POA: Insufficient documentation

## 2015-05-03 DIAGNOSIS — Z794 Long term (current) use of insulin: Secondary | ICD-10-CM | POA: Diagnosis not present

## 2015-05-03 DIAGNOSIS — N186 End stage renal disease: Secondary | ICD-10-CM | POA: Insufficient documentation

## 2015-05-03 DIAGNOSIS — Z01818 Encounter for other preprocedural examination: Secondary | ICD-10-CM | POA: Diagnosis not present

## 2015-05-03 HISTORY — DX: Other specified health status: Z78.9

## 2015-05-03 LAB — COMPREHENSIVE METABOLIC PANEL
ALT: 26 U/L (ref 17–63)
AST: 39 U/L (ref 15–41)
Albumin: 3.1 g/dL — ABNORMAL LOW (ref 3.5–5.0)
Alkaline Phosphatase: 233 U/L — ABNORMAL HIGH (ref 38–126)
Anion gap: 11 (ref 5–15)
BUN: 22 mg/dL — ABNORMAL HIGH (ref 6–20)
CO2: 29 mmol/L (ref 22–32)
Calcium: 8.9 mg/dL (ref 8.9–10.3)
Chloride: 91 mmol/L — ABNORMAL LOW (ref 101–111)
Creatinine, Ser: 4.94 mg/dL — ABNORMAL HIGH (ref 0.61–1.24)
GFR calc Af Amer: 13 mL/min — ABNORMAL LOW (ref >60)
GFR calc non Af Amer: 11 mL/min — ABNORMAL LOW (ref >60)
Glucose, Bld: 158 mg/dL — ABNORMAL HIGH (ref 65–99)
Potassium: 3.8 mmol/L (ref 3.5–5.1)
Sodium: 131 mmol/L — ABNORMAL LOW (ref 135–145)
Total Bilirubin: 1.6 mg/dL — ABNORMAL HIGH (ref 0.3–1.2)
Total Protein: 7.4 g/dL (ref 6.5–8.1)

## 2015-05-03 LAB — PROTIME-INR
INR: 4.42 — ABNORMAL HIGH (ref 0.00–1.49)
Prothrombin Time: 40.9 seconds — ABNORMAL HIGH (ref 11.6–15.2)

## 2015-05-03 LAB — CBC WITH DIFFERENTIAL/PLATELET
Basophils Absolute: 0 10*3/uL (ref 0.0–0.1)
Basophils Relative: 1 %
Eosinophils Absolute: 0 10*3/uL (ref 0.0–0.7)
Eosinophils Relative: 1 %
HCT: 34.9 % — ABNORMAL LOW (ref 39.0–52.0)
Hemoglobin: 11.4 g/dL — ABNORMAL LOW (ref 13.0–17.0)
Lymphocytes Relative: 28 %
Lymphs Abs: 0.7 10*3/uL (ref 0.7–4.0)
MCH: 27 pg (ref 26.0–34.0)
MCHC: 32.7 g/dL (ref 30.0–36.0)
MCV: 82.5 fL (ref 78.0–100.0)
Monocytes Absolute: 0.2 10*3/uL (ref 0.1–1.0)
Monocytes Relative: 7 %
Neutro Abs: 1.6 10*3/uL — ABNORMAL LOW (ref 1.7–7.7)
Neutrophils Relative %: 63 %
Platelets: 99 10*3/uL — ABNORMAL LOW (ref 150–400)
RBC: 4.23 MIL/uL (ref 4.22–5.81)
RDW: 20.3 % — ABNORMAL HIGH (ref 11.5–15.5)
WBC: 2.6 10*3/uL — ABNORMAL LOW (ref 4.0–10.5)

## 2015-05-03 LAB — GLUCOSE, CAPILLARY: Glucose-Capillary: 170 mg/dL — ABNORMAL HIGH (ref 65–99)

## 2015-05-03 NOTE — Progress Notes (Signed)
   05/03/15 0938  OBSTRUCTIVE SLEEP APNEA  Have you ever been diagnosed with sleep apnea through a sleep study? No  Do you snore loudly (loud enough to be heard through closed doors)?  0 ("I live alone, no one stays with me to tell me",  if my sleep/breathing  is loud or broken")  Do you often feel tired, fatigued, or sleepy during the daytime (such as falling asleep during driving or talking to someone)? 1  Has anyone observed you stop breathing during your sleep? 0  Do you have, or are you being treated for high blood pressure? 1  BMI more than 35 kg/m2? 0  Age > 50 (1-yes) 1  Neck circumference greater than:Male 16 inches or larger, Male 17inches or larger? 1  Male Gender (Yes=1) 1  Obstructive Sleep Apnea Score 5  Score 5 or greater  Results sent to PCP

## 2015-05-03 NOTE — Progress Notes (Signed)
Call to [facility] relative to pt. Lacking instructions on Warfarin, spoke with Abigail Butts, she will speak with Dr. Brantley Stage & then f/u with pt.

## 2015-05-03 NOTE — Pre-Procedure Instructions (Signed)
[name]  05/03/2015      [facility] - [place], [address] [place] [address] Phone: [phone] Fax: [phone]  [place], [place] [address] [place] [address] Phone: [phone] Fax: [phone]    Your procedure is scheduled on 05/08/2015.  Report to [facility] Admitting at 1:00  P.M.  Call this number if you have problems the morning of surgery:  [phone]   Remember:  Do not eat food or drink liquids after midnight.    On Tuesday 05/07/2015   Take these medicines the morning of surgery with A SIP OF WATER : Tylenol, Cartia..............SEE INSULIN INSTRUCTIONS   Do not wear jewelry   Do not wear lotions, powders, or perfumes.  You may wear deodorant.     Men may shave face and neck.   Do not bring valuables to the hospital.   [facility] is not responsible for any belongings or valuables.  Contacts, dentures or bridgework may not be worn into surgery.  Leave your suitcase in the car.  After surgery it may be brought to your room.  For patients admitted to the hospital, discharge time will be determined by your treatment team.  Patients discharged the day of surgery will not be allowed to drive home.   Name and phone number of your driver:  SON  Special instructions:   Special Instructions:  [facility] - Preparing for Surgery  Before surgery, you can play an important role.  Because skin is not sterile, your skin needs to be as free of germs as possible.  You can reduce the number of germs on you skin by washing with CHG (chlorahexidine gluconate) soap before surgery.  CHG is an antiseptic cleaner which kills germs and bonds with the skin to continue killing germs even after washing.  Please DO NOT use if you have an allergy to CHG or antibacterial soaps.  If your skin becomes reddened/irritated stop using the CHG and inform your nurse when you arrive at  Short Stay.  Do not shave (including legs and underarms) for at least 48 hours prior to the first CHG shower.  You may shave your face.  Please follow these instructions carefully:   1.  Shower with CHG Soap the night before surgery and the  morning of Surgery.  2.  If you choose to wash your hair, wash your hair first as usual with your  normal shampoo.  3.  After you shampoo, rinse your hair and body thoroughly to remove the  Shampoo.  4.  Use CHG as you would any other liquid soap.  You can apply chg directly to the skin and wash gently with scrungie or a clean washcloth.  5.  Apply the CHG Soap to your body ONLY FROM THE NECK DOWN.    Do not use on open wounds or open sores.  Avoid contact with your eyes, ears, mouth and genitals (private parts).  Wash genitals (private parts)   with your normal soap.  6.  Wash thoroughly, paying special attention to the area where your surgery will be performed.  7.  Thoroughly rinse your body with warm water from the neck down.  8.  DO NOT shower/wash with your normal soap after using and rinsing off   the CHG Soap.  9.  Pat yourself dry with a clean towel.  10.  Wear clean pajamas.            11.  Place clean sheets on your bed the night of your first shower and do not sleep with pets.  Day of Surgery  Do not apply any lotions/deodorants the morning of surgery.  Please wear clean clothes to the hospital/surgery center.    VERY IMPORTANT- READ ALL INFORMATION   How to Manage Your Diabetes Before Surgery   Why is it important to control my blood sugar before and after surgery?   Improving blood sugar levels before and after surgery helps healing and can limit problems.  A way of improving blood sugar control is eating a healthy diet by:  - Eating less sugar and carbohydrates  - Increasing activity/exercise  - Talk with your doctor about reaching your blood sugar goals  High blood sugars (greater than 180 mg/dL) can raise your  risk of infections and slow down your recovery so you will need to focus on controlling your diabetes during the weeks before surgery.  Make sure that the doctor who takes care of your diabetes knows about your planned surgery including the date and location.  How do I manage my blood sugars before surgery?   Check your blood sugar at least 4 times a day, 2 days before surgery to make sure that they are not too high or low.   Check your blood sugar the morning of your surgery when you wake up and every 2               hours until you get to the Short-Stay unit.  If your blood sugar is less than 70 mg/dL, you will need to treat for low blood sugar by:  Treat a low blood sugar (less than 70 mg/dL) with 1/2 cup of clear juice (cranberry or apple), 4 glucose tablets, OR glucose gel.  Recheck blood sugar in 15 minutes after treatment (to make sure it is greater than 70 mg/dL).  If blood sugar is not greater than 70 mg/dL on re-check, call [phone] for further instructions.   Report your blood sugar to the Short-Stay nurse when you get to Short-Stay.  References:  [facility], 2007 "How to Manage your Diabetes Before and After Surgery".  What do I do about my diabetes medications?   THE NIGHT BEFORE SURGERY, take  24  units of LANTUS Insulin.               IN THE MORNING CHECK BLOOD SUGAR, IF ITS GREATER THAN 220 , THEN TAKE 5 UNITS of HUMALOG, check blood sugar again in 15 mins.           IF THE BLOOD SUGAR LESS THAN 70- DRINK half cup of clear Cranberry Juice          Please read over the following fact sheets that you were given. Pain Booklet, Coughing and Deep Breathing and Surgical Site Infection Prevention

## 2015-05-04 DIAGNOSIS — D509 Iron deficiency anemia, unspecified: Secondary | ICD-10-CM | POA: Diagnosis not present

## 2015-05-04 DIAGNOSIS — N186 End stage renal disease: Secondary | ICD-10-CM | POA: Diagnosis not present

## 2015-05-04 DIAGNOSIS — E876 Hypokalemia: Secondary | ICD-10-CM | POA: Diagnosis not present

## 2015-05-04 DIAGNOSIS — D631 Anemia in chronic kidney disease: Secondary | ICD-10-CM | POA: Diagnosis not present

## 2015-05-04 DIAGNOSIS — E1129 Type 2 diabetes mellitus with other diabetic kidney complication: Secondary | ICD-10-CM | POA: Diagnosis not present

## 2015-05-04 DIAGNOSIS — N2581 Secondary hyperparathyroidism of renal origin: Secondary | ICD-10-CM | POA: Diagnosis not present

## 2015-05-04 LAB — HEMOGLOBIN A1C
Hgb A1c MFr Bld: 8.7 % — ABNORMAL HIGH (ref 4.8–5.6)
Mean Plasma Glucose: 203 mg/dL

## 2015-05-06 NOTE — Progress Notes (Signed)
Anesthesia Chart Review: Patient is a 64 year old male scheduled for excision of right scalp mass on 05/08/15 by Dr. Brantley Stage. 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 OSA screening score was 5.  PCP Dr. Reynaldo Minium. Nephrologist is Dr. Elmarie Shiley. HEM-ONC is Dr. Alen Blew. HE is not followed routinely by cardiology, however, he has had intermittent testing and evaluations--last 11/2013 with Dr. Gwenlyn Found during a hospitalization for chest pain felt related to PNA.   Meds include Cartiz XT, Lantus, Humalog, Rena-vit, Niferex, Renvela, warfarin.  05/03/15 EKG: Probable ST at 106 bpm, first degree AVB, LAD/LAFB, right BBB, anterior infarct (age undetermined).  EKG on 03/15/12 showed NSR, probable LAE, right BBB, LAFB, LVH. 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  12/28/12 Echo: Study Conclusions - Left ventricle: The cavity size was normal. Wall thickness was increased in a pattern of mild LVH. Systolic function was normal. The estimated ejection fraction was in the range of 55% to 60%. - Mitral valve: Mild regurgitation. - Left atrium: The atrium was mildly dilated. - Atrial septum: No defect or patent foramen ovale was identified. - Pulmonary arteries: PA peak pressure: 15m Hg (S). -  Pericardium, extracardiac: A trivial pericardial effusion was identified.  03/18/12 Nuclear stress test: 1. No infarct or ischemia.  2. Calculated ejection fraction 58%.   03/22/15 PET scan: IMPRESSION: 1. No activity in the right scalp at the site of biopsy-proven malignancy on today's examination. This may be because the residual lesion is below the size threshold for detection by PET imaging. 2. No definite primary source for malignancy identified in the neck, chest, abdomen or pelvis. 3. Mildly enlarged mildly hypermetabolic left axillary lymph node. This is nonspecific. 4. There also multiple borderline to mildly enlarged inguinal lymph nodes bilaterally which demonstrate low-level metabolic activity. This is also nonspecific, but clinical correlation for signs and symptoms of recurrent lymphoma is suggested. 5. The appearance of the liver suggests underlying cirrhosis. There is a small volume of ascites. 6. Cardiomegaly. 7. Atherosclerosis, including left main and 2 vessel coronary artery disease. Please note that although the presence of coronary artery calcium documents the presence of coronary artery disease, the severity of this disease and any potential stenosis cannot be assessed on this non-gated CT examination. Assessment for potential risk factor modification, dietary therapy or pharmacologic therapy may be warranted, if clinically indicated. 8. Cholelithiasis without evidence of acute cholecystitis at this time. 9. Additional incidental findings, as above. (see report under Results Review tab)  Preoperative labs noted. Na 131, Cr 4.94. Total bilirubin 1.6 (up since 07/2013), AST/ALT WNL. Alk Phos 233. H/H 11.4/34.9. PLT 99K. WBC 2.6. A1C 8.7. PT/INR 40.9/4.42. I called results to TSomervellat CMcIntosh She reports notes indicate that patient has been called to hold warfarin 5 days prior to surgery. He will need repeat PT/INR and an ISTAT on the day of surgery.   Discussed above with  anesthesiologist Dr. Tobias Alexander. If labs acceptable and on acute CV symptoms then it is anticipated that he can proceed as planned.  [facility] Short Stay Center/Anesthesiology Phone [phone] 05/06/2015 4:40 PM

## 2015-05-07 DIAGNOSIS — D509 Iron deficiency anemia, unspecified: Secondary | ICD-10-CM | POA: Diagnosis not present

## 2015-05-07 DIAGNOSIS — N2581 Secondary hyperparathyroidism of renal origin: Secondary | ICD-10-CM | POA: Diagnosis not present

## 2015-05-07 DIAGNOSIS — D631 Anemia in chronic kidney disease: Secondary | ICD-10-CM | POA: Diagnosis not present

## 2015-05-07 DIAGNOSIS — E1129 Type 2 diabetes mellitus with other diabetic kidney complication: Secondary | ICD-10-CM | POA: Diagnosis not present

## 2015-05-07 DIAGNOSIS — E876 Hypokalemia: Secondary | ICD-10-CM | POA: Diagnosis not present

## 2015-05-07 DIAGNOSIS — N186 End stage renal disease: Secondary | ICD-10-CM | POA: Diagnosis not present

## 2015-05-07 MED ORDER — DEXTROSE 5 % IV SOLN
3.0000 g | INTRAVENOUS | Status: AC
  Administered 2015-05-08: 3 g via INTRAVENOUS
  Filled 2015-05-07: qty 3000

## 2015-05-08 ENCOUNTER — Encounter (HOSPITAL_COMMUNITY)
Admission: RE | Disposition: A | Discharge status: Home / Self Care | Payer: Self-pay | Source: Ambulatory Visit | Attending: Surgery

## 2015-05-08 ENCOUNTER — Ambulatory Visit (HOSPITAL_COMMUNITY)
Admission: RE | Admit: 2015-05-08 | Discharge: 2015-05-08 | Disposition: A | Discharge status: Home / Self Care | Payer: Medicare Other | Source: Ambulatory Visit | Attending: Surgery | Admitting: Surgery

## 2015-05-08 ENCOUNTER — Encounter (HOSPITAL_COMMUNITY): Payer: Self-pay | Admitting: Surgery

## 2015-05-08 ENCOUNTER — Ambulatory Visit (HOSPITAL_COMMUNITY): Payer: Medicare Other | Admitting: Certified Registered"

## 2015-05-08 ENCOUNTER — Ambulatory Visit (HOSPITAL_COMMUNITY): Payer: Medicare Other | Admitting: Vascular Surgery

## 2015-05-08 DIAGNOSIS — C4449 Other specified malignant neoplasm of skin of scalp and neck: Secondary | ICD-10-CM | POA: Diagnosis not present

## 2015-05-08 DIAGNOSIS — E1151 Type 2 diabetes mellitus with diabetic peripheral angiopathy without gangrene: Secondary | ICD-10-CM | POA: Insufficient documentation

## 2015-05-08 DIAGNOSIS — Z794 Long term (current) use of insulin: Secondary | ICD-10-CM | POA: Diagnosis not present

## 2015-05-08 DIAGNOSIS — D492 Neoplasm of unspecified behavior of bone, soft tissue, and skin: Secondary | ICD-10-CM | POA: Diagnosis not present

## 2015-05-08 DIAGNOSIS — E1122 Type 2 diabetes mellitus with diabetic chronic kidney disease: Secondary | ICD-10-CM | POA: Insufficient documentation

## 2015-05-08 DIAGNOSIS — Z992 Dependence on renal dialysis: Secondary | ICD-10-CM | POA: Insufficient documentation

## 2015-05-08 DIAGNOSIS — Z79899 Other long term (current) drug therapy: Secondary | ICD-10-CM | POA: Insufficient documentation

## 2015-05-08 DIAGNOSIS — R0602 Shortness of breath: Secondary | ICD-10-CM | POA: Diagnosis not present

## 2015-05-08 DIAGNOSIS — K219 Gastro-esophageal reflux disease without esophagitis: Secondary | ICD-10-CM | POA: Diagnosis not present

## 2015-05-08 DIAGNOSIS — G4730 Sleep apnea, unspecified: Secondary | ICD-10-CM | POA: Diagnosis not present

## 2015-05-08 DIAGNOSIS — Z86718 Personal history of other venous thrombosis and embolism: Secondary | ICD-10-CM | POA: Diagnosis not present

## 2015-05-08 DIAGNOSIS — R220 Localized swelling, mass and lump, head: Secondary | ICD-10-CM | POA: Diagnosis present

## 2015-05-08 DIAGNOSIS — N186 End stage renal disease: Secondary | ICD-10-CM | POA: Diagnosis not present

## 2015-05-08 DIAGNOSIS — I120 Hypertensive chronic kidney disease with stage 5 chronic kidney disease or end stage renal disease: Secondary | ICD-10-CM | POA: Diagnosis not present

## 2015-05-08 DIAGNOSIS — C801 Malignant (primary) neoplasm, unspecified: Secondary | ICD-10-CM | POA: Insufficient documentation

## 2015-05-08 DIAGNOSIS — C792 Secondary malignant neoplasm of skin: Secondary | ICD-10-CM | POA: Insufficient documentation

## 2015-05-08 DIAGNOSIS — Z8572 Personal history of non-Hodgkin lymphomas: Secondary | ICD-10-CM | POA: Diagnosis not present

## 2015-05-08 DIAGNOSIS — C4440 Unspecified malignant neoplasm of skin of scalp and neck: Secondary | ICD-10-CM | POA: Diagnosis not present

## 2015-05-08 DIAGNOSIS — Z7901 Long term (current) use of anticoagulants: Secondary | ICD-10-CM | POA: Insufficient documentation

## 2015-05-08 DIAGNOSIS — M1990 Unspecified osteoarthritis, unspecified site: Secondary | ICD-10-CM | POA: Diagnosis not present

## 2015-05-08 DIAGNOSIS — I739 Peripheral vascular disease, unspecified: Secondary | ICD-10-CM | POA: Diagnosis not present

## 2015-05-08 HISTORY — PX: LESION EXCISION: SHX5167

## 2015-05-08 LAB — GLUCOSE, CAPILLARY
Glucose-Capillary: 33 mg/dL — CL (ref 65–99)
Glucose-Capillary: 87 mg/dL (ref 65–99)
Glucose-Capillary: 25 mg/dL — CL (ref 65–99)
Glucose-Capillary: 82 mg/dL (ref 65–99)

## 2015-05-08 LAB — PROTIME-INR
INR: 1.74 — ABNORMAL HIGH (ref 0.00–1.49)
Prothrombin Time: 20.3 seconds — ABNORMAL HIGH (ref 11.6–15.2)

## 2015-05-08 SURGERY — EXCISION, LESION, SCALP
Anesthesia: Monitor Anesthesia Care | Site: Scalp | Laterality: Right

## 2015-05-08 MED ORDER — LIDOCAINE HCL (CARDIAC) 20 MG/ML IV SOLN
INTRAVENOUS | Status: AC
  Filled 2015-05-08: qty 5

## 2015-05-08 MED ORDER — DEXTROSE 50 % IV SOLN
INTRAVENOUS | Status: AC
  Administered 2015-05-08: 50 mL via INTRAVENOUS
  Filled 2015-05-08: qty 50

## 2015-05-08 MED ORDER — ONDANSETRON HCL 4 MG/2ML IJ SOLN: 4.0000 mg | Freq: Four times a day (QID) | INTRAMUSCULAR | Status: DC | PRN

## 2015-05-08 MED ORDER — 0.9 % SODIUM CHLORIDE (POUR BTL) OPTIME
TOPICAL | Status: DC | PRN
  Administered 2015-05-08: 1000 mL

## 2015-05-08 MED ORDER — SODIUM CHLORIDE 0.9 % IV SOLN
INTRAVENOUS | Status: DC
  Administered 2015-05-08: 13:00:00 via INTRAVENOUS

## 2015-05-08 MED ORDER — BACITRACIN-NEOMYCIN-POLYMYXIN 400-5-5000 EX OINT
TOPICAL_OINTMENT | CUTANEOUS | Status: DC | PRN
  Administered 2015-05-08: 1 via TOPICAL

## 2015-05-08 MED ORDER — FUROSEMIDE 10 MG/ML IJ SOLN
INTRAMUSCULAR | Status: AC
  Filled 2015-05-08: qty 2

## 2015-05-08 MED ORDER — OXYCODONE HCL 5 MG PO TABS: 5.0000 mg | ORAL_TABLET | Freq: Once | ORAL | Status: DC | PRN

## 2015-05-08 MED ORDER — MIDAZOLAM HCL 5 MG/5ML IJ SOLN
INTRAMUSCULAR | Status: DC | PRN
  Administered 2015-05-08 (×2): 1 mg via INTRAVENOUS

## 2015-05-08 MED ORDER — CHLORHEXIDINE GLUCONATE 4 % EX LIQD
1.0000 | Freq: Once | CUTANEOUS | Status: DC
Start: 2015-05-08 — End: 2015-05-08

## 2015-05-08 MED ORDER — DEXTROSE 50 % IV SOLN
INTRAVENOUS | Status: AC
  Administered 2015-05-08: 25 mL
  Filled 2015-05-08: qty 50

## 2015-05-08 MED ORDER — DEXTROSE 50 % IV SOLN
1.0000 | Freq: Once | INTRAVENOUS | Status: AC
Start: 2015-05-08 — End: 2015-05-08
  Administered 2015-05-08: 50 mL via INTRAVENOUS
  Filled 2015-05-08: qty 50

## 2015-05-08 MED ORDER — MIDAZOLAM HCL 2 MG/2ML IJ SOLN
INTRAMUSCULAR | Status: AC
  Filled 2015-05-08: qty 2

## 2015-05-08 MED ORDER — BACITRACIN-NEOMYCIN-POLYMYXIN 400-5-5000 EX OINT
TOPICAL_OINTMENT | CUTANEOUS | Status: AC
  Filled 2015-05-08: qty 1

## 2015-05-08 MED ORDER — ARTIFICIAL TEARS OP OINT
TOPICAL_OINTMENT | OPHTHALMIC | Status: AC
  Filled 2015-05-08: qty 3.5

## 2015-05-08 MED ORDER — OXYCODONE HCL 5 MG/5ML PO SOLN: 5.0000 mg | Freq: Once | ORAL | Status: DC | PRN

## 2015-05-08 MED ORDER — FENTANYL CITRATE (PF) 250 MCG/5ML IJ SOLN
INTRAMUSCULAR | Status: AC
  Filled 2015-05-08: qty 5

## 2015-05-08 MED ORDER — FENTANYL CITRATE (PF) 250 MCG/5ML IJ SOLN
INTRAMUSCULAR | Status: DC | PRN
  Administered 2015-05-08: 25 ug via INTRAVENOUS

## 2015-05-08 MED ORDER — LIDOCAINE-EPINEPHRINE 1 %-1:100000 IJ SOLN
INTRAMUSCULAR | Status: AC
  Filled 2015-05-08: qty 1

## 2015-05-08 MED ORDER — BUPIVACAINE-EPINEPHRINE (PF) 0.25% -1:200000 IJ SOLN
INTRAMUSCULAR | Status: AC
  Filled 2015-05-08: qty 30

## 2015-05-08 MED ORDER — LIDOCAINE-EPINEPHRINE 1 %-1:100000 IJ SOLN
INTRAMUSCULAR | Status: DC | PRN
  Administered 2015-05-08: 5 mL

## 2015-05-08 MED ORDER — GLUCOSE 40 % PO GEL
ORAL | Status: AC
  Filled 2015-05-08: qty 1

## 2015-05-08 MED ORDER — DEXTROSE 50 % IV SOLN
INTRAVENOUS | Status: AC
  Filled 2015-05-08: qty 50

## 2015-05-08 MED ORDER — FENTANYL CITRATE (PF) 100 MCG/2ML IJ SOLN: 25.0000 ug | INTRAMUSCULAR | Status: DC | PRN

## 2015-05-08 MED ORDER — DEXTROSE 50 % IV SOLN
50.0000 mL | Freq: Once | INTRAVENOUS | Status: AC
  Administered 2015-05-08: 50 mL via INTRAVENOUS

## 2015-05-08 MED ORDER — HYDROCODONE-ACETAMINOPHEN 5-325 MG PO TABS: 1.0000 | ORAL_TABLET | Freq: Four times a day (QID) | ORAL | Status: DC | PRN

## 2015-05-08 MED ORDER — PROPOFOL 10 MG/ML IV BOLUS
INTRAVENOUS | Status: AC
  Filled 2015-05-08: qty 20

## 2015-05-08 SURGICAL SUPPLY — 18 items
COVER SURGICAL LIGHT HANDLE (MISCELLANEOUS) ×1 IMPLANT
DRAPE LAPAROTOMY T 98X78 PEDS (DRAPES) ×1 IMPLANT
DRAPE UTILITY XL STRL (DRAPES) ×3 IMPLANT
ELECT CAUTERY BLADE 6.4 (BLADE) ×1 IMPLANT
GLOVE BIOGEL PI IND STRL 7.0 (GLOVE) IMPLANT
GLOVE BIOGEL PI INDICATOR 7.0 (GLOVE) ×2
GOWN STRL REUS W/ TWL LRG LVL3 (GOWN DISPOSABLE) IMPLANT
GOWN STRL REUS W/ TWL XL LVL3 (GOWN DISPOSABLE) ×1 IMPLANT
GOWN STRL REUS W/TWL LRG LVL3 (GOWN DISPOSABLE) ×2
GOWN STRL REUS W/TWL XL LVL3 (GOWN DISPOSABLE) ×2
KIT ROOM TURNOVER OR (KITS) ×2 IMPLANT
MARKER SKIN DUAL TIP RULER LAB (MISCELLANEOUS) ×1 IMPLANT
NDL HYPO 25GX1X1/2 BEV (NEEDLE) IMPLANT
NEEDLE HYPO 25GX1X1/2 BEV (NEEDLE) ×2 IMPLANT
PACK GENERAL/GYN (CUSTOM PROCEDURE TRAY) ×1 IMPLANT
PAD ARMBOARD 7.5X6 YLW CONV (MISCELLANEOUS) ×3 IMPLANT
SUT ETHILON 2 0 PSLX (SUTURE) ×1 IMPLANT
SYR CONTROL 10ML LL (SYRINGE) ×1 IMPLANT

## 2015-05-08 NOTE — Anesthesia Postprocedure Evaluation (Signed)
Anesthesia Post Note  Patient: [name]  Procedure(s) Performed: Procedure(s) (LRB): EXCISION SCALP LESION (Right)  Patient location during evaluation: PACU Anesthesia Type: MAC Level of consciousness: awake and alert Pain management: pain level controlled Vital Signs Assessment: post-procedure vital signs reviewed and stable Respiratory status: spontaneous breathing and respiratory function stable Cardiovascular status: blood pressure returned to baseline Anesthetic complications: no    Last Vitals:  Filed Vitals:   05/08/15 1645 05/08/15 1700  BP: 118/88 125/93  Pulse: 88 98  Temp:    Resp: 13 15    Last Pain:  Filed Vitals:   05/08/15 1706  PainSc: Asleep                 Audianna Landgren,[name]

## 2015-05-08 NOTE — H&P (Signed)
H&P   DAVRON PANCIERA (MR# [id number])      H&P Info    Author Note Status Last Update User Last Update Date/Time   Erroll Luna, MD Signed Erroll Luna, MD 03/18/2015 12:16 PM    H&P    Expand All Collapse All   Niam Chasteen 03/18/2015 9:41 AM Location: [facility] Patient #: [id number] DOB: 1951-02-02 Single / Language: Cleophus Molt / Race: Black or African American Male  History of Present Illness Marcello Moores A. Takiah Maiden MD; 03/18/2015 12:14 PM) Patient words: scalp carcinoma Per Dr [name] of oncology   64 year old gentleman with the following issues:  1. 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 This was detected on a scalp nodule and raises suspicious for metastatic carcinoma from a lung/aerodigestive track. He does not have any specific localizing symptoms at this time but certainly he is at risk of developing metastatic malignancy.  I will obtain a PET CT scan to determine if there is any primary site of concern whether it is lung, head and neck or esophageal. Depending on the PET scan findings further evaluation such as endoscopy or bronchoscopy might be needed.   Pt presents to discuss excision of 3 small 1 cm scalp masses right frontal region. Core biopsy shows metastatic carcinoma source unknomw. Getting a PET scan this week. Has multiple medical problems. Found during yearly physical.  The patient is a 64 year old male.   Other Problems Ventura Sellers, [place]; 03/18/2015 9:42 AM) Diabetes Mellitus Gastroesophageal Reflux Disease  Past Surgical History Ventura Sellers, [place]; 03/18/2015 9:42 AM) Dialysis Shunt / Fistula  Diagnostic Studies History Ventura Sellers, [place]; 03/18/2015 9:42 AM) Colonoscopy 5-10 years ago  Allergies Ventura Sellers, CMA; 03/18/2015 9:42 AM) No Known Drug Allergies 03/18/2015  Medication History Ventura Sellers, [place]; 03/18/2015 9:43 AM) [name] (120MG   Capsule ER 24HR, Oral) Active. Doxycycline Hyclate (100MG  Capsule, Oral) Active. Warfarin Sodium (5MG  Tablet, Oral) Active. Tylenol (325MG  Tablet, Oral) Active. Insulin Zinc (100UNIT/ML Suspension, Subcutaneous) Active. Multi Vitamin Daily (Oral) Active. [name] (150MG  Capsule, Oral) Active. Renvela (800MG  Tablet, Oral) Active.  Social History Ventura Sellers, [place]; 03/18/2015 9:42 AM) Alcohol use Remotely quit alcohol use. Caffeine use Carbonated beverages. No drug use Tobacco use Never smoker.  Family History Ventura Sellers, [place]; 03/18/2015 9:42 AM) Diabetes Mellitus Father. Migraine Headache Son.     Review of Systems ([place]. Brooks CMA; 03/18/2015 9:42 AM) General Present- Fatigue and Weight Loss. Not Present- Appetite Loss, Chills, Fever, Night Sweats and Weight Gain. Skin Present- Dryness and Non-Healing Wounds. Not Present- Change in Wart/Mole, Hives, Jaundice, New Lesions, Rash and Ulcer. HEENT Present- Wears glasses/contact lenses. Not Present- Earache, Hearing Loss, Hoarseness, Nose Bleed, Oral Ulcers, Ringing in the Ears, Seasonal Allergies, Sinus Pain, Sore Throat, Visual Disturbances and Yellow Eyes. Respiratory Present- Chronic Cough. Not Present- Bloody sputum, Difficulty Breathing, Snoring and Wheezing. Musculoskeletal Present- Muscle Weakness. Not Present- Back Pain, Joint Pain, Joint Stiffness, Muscle Pain and Swelling of Extremities. Neurological Present- Headaches. Not Present- Decreased Memory, Fainting, Numbness, Seizures, Tingling, Tremor, Trouble walking and Weakness. Hematology Present- Excessive bleeding. Not Present- Easy Bruising, Gland problems, HIV and Persistent Infections.  Vitals [employer] R. Brooks CMA; 03/18/2015 9:41 AM) 03/18/2015 9:41 AM Weight: 274.38 lb Height: 75.5in Body Surface Area: 2.52 m Body Mass Index: 33.84 kg/m  Pulse: 76 (Regular)  BP: 132/68 (Sitting, Left Arm, Standard)      Physical  Exam (Lincoln Ginley A. Welda Azzarello MD; 03/18/2015 10:05 AM)  Integumentary Note: left arm graft  Head and Neck Note: 3 cm linerar masses right frontal region 3 1 cm masses   Eye Eyeball - Bilateral-Extraocular movements intact. Sclera/Conjunctiva - Bilateral-No scleral icterus.  Chest and Lung Exam Chest and lung exam reveals -quiet, even and easy respiratory effort with no use of accessory muscles and on auscultation, normal breath sounds, no adventitious sounds and normal vocal resonance. Inspection Chest Wall - Normal. Back - normal.  Cardiovascular Cardiovascular examination reveals -on palpation PMI is normal in location and amplitude, no palpable S3 or S4. Normal cardiac borders., normal heart sounds, regular rate and rhythm with no murmurs, carotid auscultation reveals no bruits and normal pedal pulses bilaterally.  Neurologic Neurologic evaluation reveals -alert and oriented x 3 with no impairment of recent or remote memory. Mental Status-Normal.    Assessment & Plan (Rowyn Mustapha A. 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 Pt Education - [facility] Free Text Education/Instructions: discussed with patient and provided information. The anatomy and the physiology was discussed. The pathophysiology and natural history of the disease was discussed. Options were discussed and recommendations were made. Technique, risks, benefits, & alternatives were discussed. Risks such as stroke, heart attack, bleeding, indection, death, and other risks discussed. Questions answered. The patient agrees to proceed.

## 2015-05-08 NOTE — Discharge Instructions (Signed)
Cover incision with neosporin daily and wash daily   Cover with dry gauze Resume coumadin  Thursday and take usual dose Follow up 2 weeks for stitches

## 2015-05-08 NOTE — Progress Notes (Signed)
Hypoglycemic Event  CBG: 49  Treatment: D50 IV 25 mL  Symptoms: questionable lethargy versus sedation  Follow-up CBG: Time:445 CBG Result:92  Possible Reasons for Event: Medication regimen: pt took insulin before surgery  Comments/MD notified:Dr Massagee notified orders for [place], [place]

## 2015-05-08 NOTE — Anesthesia Preprocedure Evaluation (Signed)
Anesthesia Evaluation  Patient identified by MRN, date of birth, ID band Patient awake    Reviewed: Allergy & Precautions, H&P , NPO status , Patient's Chart, lab work & pertinent test results  Airway Mallampati: II   Neck ROM: full    Dental   Pulmonary shortness of breath, sleep apnea ,    breath sounds clear to auscultation       Cardiovascular hypertension, + Peripheral Vascular Disease and + DVT   Rhythm:regular Rate:Normal     Neuro/Psych  Headaches,    GI/Hepatic GERD  ,  Endo/Other  diabetes, Type 2, Insulin Dependent  Renal/GU ESRF and DialysisRenal disease     Musculoskeletal   Abdominal   Peds  Hematology Non-Hodgkin's lymphoma   Anesthesia Other Findings   Reproductive/Obstetrics                             Anesthesia Physical Anesthesia Plan  ASA: IV  Anesthesia Plan: MAC   Post-op Pain Management:    Induction: Intravenous  Airway Management Planned: Simple Face Mask  Additional Equipment:   Intra-op Plan:   Post-operative Plan:   Informed Consent: I have reviewed the patients History and Physical, chart, labs and discussed the procedure including the risks, benefits and alternatives for the proposed anesthesia with the patient or authorized representative who has indicated his/her understanding and acceptance.     Plan Discussed with: CRNA, Anesthesiologist and Surgeon  Anesthesia Plan Comments:         Anesthesia Quick Evaluation

## 2015-05-08 NOTE — Progress Notes (Signed)
CBG of 82 after 1 amp IV D50. Pt remains drowsy but easier to arouse, will recheck CBG in 30 minutes.

## 2015-05-08 NOTE — Transfer of Care (Signed)
Immediate Anesthesia Transfer of Care Note  Patient: [name]  Procedure(s) Performed: Procedure(s): EXCISION SCALP LESION (Right)  Patient Location: PACU  Anesthesia Type:MAC  Level of Consciousness: patient cooperative and responds to stimulation  Airway & Oxygen Therapy: Patient Spontanous Breathing and Patient connected to nasal cannula oxygen  Post-op Assessment: Report given to RN and Post -op Vital signs reviewed and stable  Post vital signs: Reviewed and stable  Last Vitals:  Filed Vitals:   05/08/15 1350 05/08/15 1619  BP:    Pulse:    Temp: 35.2 C [id number] C    Complications: No apparent anesthesia complications

## 2015-05-08 NOTE — Progress Notes (Signed)
CBG rechecked 1 hour after reading of 89, was 25. Pt slow to arouse and respond, 1 amp IV D50 given, will monitor and recheck CBG every 30 minutes.

## 2015-05-08 NOTE — Interval H&P Note (Signed)
History and Physical Interval Note:  05/08/2015 3:11 PM  [name]  has presented today for surgery, with the diagnosis of Scalp Lesion  The various methods of treatment have been discussed with the patient and family. After consideration of risks, benefits and other options for treatment, the patient has consented to  Procedure(s): EXCISION SCALP LESION (N/A) as a surgical intervention .  The patient's history has been reviewed, patient examined, no change in status, stable for surgery.  I have reviewed the patient's chart and labs.  Questions were answered to the patient's satisfaction.     Carlson Belland A.

## 2015-05-08 NOTE — Progress Notes (Signed)
CBG 33, pt symptomatic.  Hodierne MD called, 1amp D50 administered.  Will continue to monitor.

## 2015-05-08 NOTE — Op Note (Signed)
Preop diagnosis: Right scalp mass 3  Postoperative diagnosis: Same  Procedure: Excision of 3      1 cm scalp masses right frontal parietal region.  Surgeon: Erroll Luna [name]  Anesthesia: Mac with 1  percent lidocaine with epinephrine  EBL: Minimal  Specimens: 2 tissue specimens with 3 small masses from the right frontoparietal region. Previous history of core biopsy showing metastatic adenocarcinoma.  Drains: None  Indications for procedure: The patient's assisted 64-year-old male who is noted to have 3       1 cm subcutaneous tissue masses overlying his right frontal scalp region. 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 Workup was otherwise negative excision was recommended. Be metastatic though I discussed with him this would not be curative more than likely. He will more likely require systemic adjuvant therapy but additional tissue may be helpful to help clear this down. Risks, benefits and alternatives discussed. Risk of bleeding, infection, and the need for other operative procedures. He agreed to proceed.  Description of procedure: The patient was met in holding area and the area was marked with the assistance of the patient and by reviewing my history and physical. There are 3 nodular areas the right frontal scalp region. There are some other smaller regions throughout his scalp that did not feel the same. I discussed them and focus on this area getting more tissue and taking out what I could.  He agreed to proceed for discussion.  The patient was taken back the operating room and placed supine. The areas prepped with Betadine to his right scalp region. Timeout was done sterile prep and drape are done. 2 incisions were made in the right scalp on encompassing the large the lesion and a second smaller lesion. These were excised and closed with 2-0 nylon. The third lesion was more medial just adjacent to it was excised as well. Hemostasis achieved. Wound closes 2-0  nylon. Neosporin and dry dressing applied. All final counts found to be correct. Patient taken to recovery in satisfactory condition.

## 2015-05-09 ENCOUNTER — Encounter (HOSPITAL_COMMUNITY): Payer: Self-pay | Admitting: Surgery

## 2015-05-09 DIAGNOSIS — D631 Anemia in chronic kidney disease: Secondary | ICD-10-CM | POA: Diagnosis not present

## 2015-05-09 DIAGNOSIS — E1129 Type 2 diabetes mellitus with other diabetic kidney complication: Secondary | ICD-10-CM | POA: Diagnosis not present

## 2015-05-09 DIAGNOSIS — N186 End stage renal disease: Secondary | ICD-10-CM | POA: Diagnosis not present

## 2015-05-09 DIAGNOSIS — D509 Iron deficiency anemia, unspecified: Secondary | ICD-10-CM | POA: Diagnosis not present

## 2015-05-09 DIAGNOSIS — E876 Hypokalemia: Secondary | ICD-10-CM | POA: Diagnosis not present

## 2015-05-09 DIAGNOSIS — N2581 Secondary hyperparathyroidism of renal origin: Secondary | ICD-10-CM | POA: Diagnosis not present

## 2015-05-09 LAB — GLUCOSE, CAPILLARY
Glucose-Capillary: 49 mg/dL — ABNORMAL LOW (ref 65–99)
Glucose-Capillary: 92 mg/dL (ref 65–99)

## 2015-05-11 DIAGNOSIS — E1129 Type 2 diabetes mellitus with other diabetic kidney complication: Secondary | ICD-10-CM | POA: Diagnosis not present

## 2015-05-11 DIAGNOSIS — E876 Hypokalemia: Secondary | ICD-10-CM | POA: Diagnosis not present

## 2015-05-11 DIAGNOSIS — N186 End stage renal disease: Secondary | ICD-10-CM | POA: Diagnosis not present

## 2015-05-11 DIAGNOSIS — D631 Anemia in chronic kidney disease: Secondary | ICD-10-CM | POA: Diagnosis not present

## 2015-05-11 DIAGNOSIS — N2581 Secondary hyperparathyroidism of renal origin: Secondary | ICD-10-CM | POA: Diagnosis not present

## 2015-05-11 DIAGNOSIS — D509 Iron deficiency anemia, unspecified: Secondary | ICD-10-CM | POA: Diagnosis not present

## 2015-05-14 DIAGNOSIS — N2581 Secondary hyperparathyroidism of renal origin: Secondary | ICD-10-CM | POA: Diagnosis not present

## 2015-05-14 DIAGNOSIS — D509 Iron deficiency anemia, unspecified: Secondary | ICD-10-CM | POA: Diagnosis not present

## 2015-05-14 DIAGNOSIS — D631 Anemia in chronic kidney disease: Secondary | ICD-10-CM | POA: Diagnosis not present

## 2015-05-14 DIAGNOSIS — E876 Hypokalemia: Secondary | ICD-10-CM | POA: Diagnosis not present

## 2015-05-14 DIAGNOSIS — N186 End stage renal disease: Secondary | ICD-10-CM | POA: Diagnosis not present

## 2015-05-14 DIAGNOSIS — E1129 Type 2 diabetes mellitus with other diabetic kidney complication: Secondary | ICD-10-CM | POA: Diagnosis not present

## 2015-05-16 DIAGNOSIS — E876 Hypokalemia: Secondary | ICD-10-CM | POA: Diagnosis not present

## 2015-05-16 DIAGNOSIS — E1129 Type 2 diabetes mellitus with other diabetic kidney complication: Secondary | ICD-10-CM | POA: Diagnosis not present

## 2015-05-16 DIAGNOSIS — D509 Iron deficiency anemia, unspecified: Secondary | ICD-10-CM | POA: Diagnosis not present

## 2015-05-16 DIAGNOSIS — D631 Anemia in chronic kidney disease: Secondary | ICD-10-CM | POA: Diagnosis not present

## 2015-05-16 DIAGNOSIS — N186 End stage renal disease: Secondary | ICD-10-CM | POA: Diagnosis not present

## 2015-05-16 DIAGNOSIS — N2581 Secondary hyperparathyroidism of renal origin: Secondary | ICD-10-CM | POA: Diagnosis not present

## 2015-05-18 DIAGNOSIS — D631 Anemia in chronic kidney disease: Secondary | ICD-10-CM | POA: Diagnosis not present

## 2015-05-18 DIAGNOSIS — E876 Hypokalemia: Secondary | ICD-10-CM | POA: Diagnosis not present

## 2015-05-18 DIAGNOSIS — N2581 Secondary hyperparathyroidism of renal origin: Secondary | ICD-10-CM | POA: Diagnosis not present

## 2015-05-18 DIAGNOSIS — N186 End stage renal disease: Secondary | ICD-10-CM | POA: Diagnosis not present

## 2015-05-18 DIAGNOSIS — E1129 Type 2 diabetes mellitus with other diabetic kidney complication: Secondary | ICD-10-CM | POA: Diagnosis not present

## 2015-05-18 DIAGNOSIS — D509 Iron deficiency anemia, unspecified: Secondary | ICD-10-CM | POA: Diagnosis not present

## 2015-05-21 DIAGNOSIS — D509 Iron deficiency anemia, unspecified: Secondary | ICD-10-CM | POA: Diagnosis not present

## 2015-05-21 DIAGNOSIS — E1129 Type 2 diabetes mellitus with other diabetic kidney complication: Secondary | ICD-10-CM | POA: Diagnosis not present

## 2015-05-21 DIAGNOSIS — D631 Anemia in chronic kidney disease: Secondary | ICD-10-CM | POA: Diagnosis not present

## 2015-05-21 DIAGNOSIS — E876 Hypokalemia: Secondary | ICD-10-CM | POA: Diagnosis not present

## 2015-05-21 DIAGNOSIS — N186 End stage renal disease: Secondary | ICD-10-CM | POA: Diagnosis not present

## 2015-05-21 DIAGNOSIS — N2581 Secondary hyperparathyroidism of renal origin: Secondary | ICD-10-CM | POA: Diagnosis not present

## 2015-05-23 DIAGNOSIS — E1129 Type 2 diabetes mellitus with other diabetic kidney complication: Secondary | ICD-10-CM | POA: Diagnosis not present

## 2015-05-23 DIAGNOSIS — N2581 Secondary hyperparathyroidism of renal origin: Secondary | ICD-10-CM | POA: Diagnosis not present

## 2015-05-23 DIAGNOSIS — D631 Anemia in chronic kidney disease: Secondary | ICD-10-CM | POA: Diagnosis not present

## 2015-05-23 DIAGNOSIS — E876 Hypokalemia: Secondary | ICD-10-CM | POA: Diagnosis not present

## 2015-05-23 DIAGNOSIS — D509 Iron deficiency anemia, unspecified: Secondary | ICD-10-CM | POA: Diagnosis not present

## 2015-05-23 DIAGNOSIS — N186 End stage renal disease: Secondary | ICD-10-CM | POA: Diagnosis not present

## 2015-05-25 DIAGNOSIS — E876 Hypokalemia: Secondary | ICD-10-CM | POA: Diagnosis not present

## 2015-05-25 DIAGNOSIS — N2581 Secondary hyperparathyroidism of renal origin: Secondary | ICD-10-CM | POA: Diagnosis not present

## 2015-05-25 DIAGNOSIS — E1129 Type 2 diabetes mellitus with other diabetic kidney complication: Secondary | ICD-10-CM | POA: Diagnosis not present

## 2015-05-25 DIAGNOSIS — D631 Anemia in chronic kidney disease: Secondary | ICD-10-CM | POA: Diagnosis not present

## 2015-05-25 DIAGNOSIS — N186 End stage renal disease: Secondary | ICD-10-CM | POA: Diagnosis not present

## 2015-05-25 DIAGNOSIS — D509 Iron deficiency anemia, unspecified: Secondary | ICD-10-CM | POA: Diagnosis not present

## 2015-05-28 DIAGNOSIS — N2581 Secondary hyperparathyroidism of renal origin: Secondary | ICD-10-CM | POA: Diagnosis not present

## 2015-05-28 DIAGNOSIS — N186 End stage renal disease: Secondary | ICD-10-CM | POA: Diagnosis not present

## 2015-05-28 DIAGNOSIS — D509 Iron deficiency anemia, unspecified: Secondary | ICD-10-CM | POA: Diagnosis not present

## 2015-05-28 DIAGNOSIS — E876 Hypokalemia: Secondary | ICD-10-CM | POA: Diagnosis not present

## 2015-05-28 DIAGNOSIS — E1129 Type 2 diabetes mellitus with other diabetic kidney complication: Secondary | ICD-10-CM | POA: Diagnosis not present

## 2015-05-28 DIAGNOSIS — D631 Anemia in chronic kidney disease: Secondary | ICD-10-CM | POA: Diagnosis not present

## 2015-05-29 DIAGNOSIS — Z992 Dependence on renal dialysis: Secondary | ICD-10-CM | POA: Diagnosis not present

## 2015-05-29 DIAGNOSIS — T82858D Stenosis of vascular prosthetic devices, implants and grafts, subsequent encounter: Secondary | ICD-10-CM | POA: Diagnosis not present

## 2015-05-29 DIAGNOSIS — N186 End stage renal disease: Secondary | ICD-10-CM | POA: Diagnosis not present

## 2015-05-29 DIAGNOSIS — I871 Compression of vein: Secondary | ICD-10-CM | POA: Diagnosis not present

## 2015-05-30 DIAGNOSIS — E876 Hypokalemia: Secondary | ICD-10-CM | POA: Diagnosis not present

## 2015-05-30 DIAGNOSIS — D509 Iron deficiency anemia, unspecified: Secondary | ICD-10-CM | POA: Diagnosis not present

## 2015-05-30 DIAGNOSIS — D631 Anemia in chronic kidney disease: Secondary | ICD-10-CM | POA: Diagnosis not present

## 2015-05-30 DIAGNOSIS — N2581 Secondary hyperparathyroidism of renal origin: Secondary | ICD-10-CM | POA: Diagnosis not present

## 2015-05-30 DIAGNOSIS — E1129 Type 2 diabetes mellitus with other diabetic kidney complication: Secondary | ICD-10-CM | POA: Diagnosis not present

## 2015-05-30 DIAGNOSIS — N186 End stage renal disease: Secondary | ICD-10-CM | POA: Diagnosis not present

## 2015-06-01 DIAGNOSIS — N2581 Secondary hyperparathyroidism of renal origin: Secondary | ICD-10-CM | POA: Diagnosis not present

## 2015-06-01 DIAGNOSIS — E876 Hypokalemia: Secondary | ICD-10-CM | POA: Diagnosis not present

## 2015-06-01 DIAGNOSIS — Z992 Dependence on renal dialysis: Secondary | ICD-10-CM | POA: Diagnosis not present

## 2015-06-01 DIAGNOSIS — N186 End stage renal disease: Secondary | ICD-10-CM | POA: Diagnosis not present

## 2015-06-01 DIAGNOSIS — D631 Anemia in chronic kidney disease: Secondary | ICD-10-CM | POA: Diagnosis not present

## 2015-06-01 DIAGNOSIS — E1129 Type 2 diabetes mellitus with other diabetic kidney complication: Secondary | ICD-10-CM | POA: Diagnosis not present

## 2015-06-01 DIAGNOSIS — D509 Iron deficiency anemia, unspecified: Secondary | ICD-10-CM | POA: Diagnosis not present

## 2015-06-04 ENCOUNTER — Encounter: Payer: Self-pay | Admitting: Internal Medicine

## 2015-06-04 DIAGNOSIS — D509 Iron deficiency anemia, unspecified: Secondary | ICD-10-CM | POA: Diagnosis not present

## 2015-06-04 DIAGNOSIS — N186 End stage renal disease: Secondary | ICD-10-CM | POA: Diagnosis not present

## 2015-06-04 DIAGNOSIS — D631 Anemia in chronic kidney disease: Secondary | ICD-10-CM | POA: Diagnosis not present

## 2015-06-04 DIAGNOSIS — N2581 Secondary hyperparathyroidism of renal origin: Secondary | ICD-10-CM | POA: Diagnosis not present

## 2015-06-06 DIAGNOSIS — D509 Iron deficiency anemia, unspecified: Secondary | ICD-10-CM | POA: Diagnosis not present

## 2015-06-06 DIAGNOSIS — N2581 Secondary hyperparathyroidism of renal origin: Secondary | ICD-10-CM | POA: Diagnosis not present

## 2015-06-06 DIAGNOSIS — D631 Anemia in chronic kidney disease: Secondary | ICD-10-CM | POA: Diagnosis not present

## 2015-06-06 DIAGNOSIS — N186 End stage renal disease: Secondary | ICD-10-CM | POA: Diagnosis not present

## 2015-06-08 DIAGNOSIS — N186 End stage renal disease: Secondary | ICD-10-CM | POA: Diagnosis not present

## 2015-06-08 DIAGNOSIS — N2581 Secondary hyperparathyroidism of renal origin: Secondary | ICD-10-CM | POA: Diagnosis not present

## 2015-06-08 DIAGNOSIS — D509 Iron deficiency anemia, unspecified: Secondary | ICD-10-CM | POA: Diagnosis not present

## 2015-06-08 DIAGNOSIS — D631 Anemia in chronic kidney disease: Secondary | ICD-10-CM | POA: Diagnosis not present

## 2015-06-11 DIAGNOSIS — N2581 Secondary hyperparathyroidism of renal origin: Secondary | ICD-10-CM | POA: Diagnosis not present

## 2015-06-11 DIAGNOSIS — D631 Anemia in chronic kidney disease: Secondary | ICD-10-CM | POA: Diagnosis not present

## 2015-06-11 DIAGNOSIS — N186 End stage renal disease: Secondary | ICD-10-CM | POA: Diagnosis not present

## 2015-06-11 DIAGNOSIS — D509 Iron deficiency anemia, unspecified: Secondary | ICD-10-CM | POA: Diagnosis not present

## 2015-06-12 DIAGNOSIS — Z7901 Long term (current) use of anticoagulants: Secondary | ICD-10-CM | POA: Diagnosis not present

## 2015-06-12 DIAGNOSIS — I4891 Unspecified atrial fibrillation: Secondary | ICD-10-CM | POA: Diagnosis not present

## 2015-06-12 DIAGNOSIS — I80202 Phlebitis and thrombophlebitis of unspecified deep vessels of left lower extremity: Secondary | ICD-10-CM | POA: Diagnosis not present

## 2015-06-13 DIAGNOSIS — D509 Iron deficiency anemia, unspecified: Secondary | ICD-10-CM | POA: Diagnosis not present

## 2015-06-13 DIAGNOSIS — N186 End stage renal disease: Secondary | ICD-10-CM | POA: Diagnosis not present

## 2015-06-13 DIAGNOSIS — N2581 Secondary hyperparathyroidism of renal origin: Secondary | ICD-10-CM | POA: Diagnosis not present

## 2015-06-13 DIAGNOSIS — D631 Anemia in chronic kidney disease: Secondary | ICD-10-CM | POA: Diagnosis not present

## 2015-06-14 DIAGNOSIS — Z7901 Long term (current) use of anticoagulants: Secondary | ICD-10-CM | POA: Diagnosis not present

## 2015-06-14 DIAGNOSIS — I4891 Unspecified atrial fibrillation: Secondary | ICD-10-CM | POA: Diagnosis not present

## 2015-06-15 DIAGNOSIS — D631 Anemia in chronic kidney disease: Secondary | ICD-10-CM | POA: Diagnosis not present

## 2015-06-15 DIAGNOSIS — D509 Iron deficiency anemia, unspecified: Secondary | ICD-10-CM | POA: Diagnosis not present

## 2015-06-15 DIAGNOSIS — N186 End stage renal disease: Secondary | ICD-10-CM | POA: Diagnosis not present

## 2015-06-15 DIAGNOSIS — N2581 Secondary hyperparathyroidism of renal origin: Secondary | ICD-10-CM | POA: Diagnosis not present

## 2015-06-18 DIAGNOSIS — N186 End stage renal disease: Secondary | ICD-10-CM | POA: Diagnosis not present

## 2015-06-18 DIAGNOSIS — N2581 Secondary hyperparathyroidism of renal origin: Secondary | ICD-10-CM | POA: Diagnosis not present

## 2015-06-18 DIAGNOSIS — D631 Anemia in chronic kidney disease: Secondary | ICD-10-CM | POA: Diagnosis not present

## 2015-06-18 DIAGNOSIS — D509 Iron deficiency anemia, unspecified: Secondary | ICD-10-CM | POA: Diagnosis not present

## 2015-06-20 DIAGNOSIS — E1129 Type 2 diabetes mellitus with other diabetic kidney complication: Secondary | ICD-10-CM | POA: Diagnosis not present

## 2015-06-20 DIAGNOSIS — D509 Iron deficiency anemia, unspecified: Secondary | ICD-10-CM | POA: Diagnosis not present

## 2015-06-20 DIAGNOSIS — D631 Anemia in chronic kidney disease: Secondary | ICD-10-CM | POA: Diagnosis not present

## 2015-06-20 DIAGNOSIS — N186 End stage renal disease: Secondary | ICD-10-CM | POA: Diagnosis not present

## 2015-06-20 DIAGNOSIS — N2581 Secondary hyperparathyroidism of renal origin: Secondary | ICD-10-CM | POA: Diagnosis not present

## 2015-06-22 DIAGNOSIS — D509 Iron deficiency anemia, unspecified: Secondary | ICD-10-CM | POA: Diagnosis not present

## 2015-06-22 DIAGNOSIS — N2581 Secondary hyperparathyroidism of renal origin: Secondary | ICD-10-CM | POA: Diagnosis not present

## 2015-06-22 DIAGNOSIS — D631 Anemia in chronic kidney disease: Secondary | ICD-10-CM | POA: Diagnosis not present

## 2015-06-22 DIAGNOSIS — N186 End stage renal disease: Secondary | ICD-10-CM | POA: Diagnosis not present

## 2015-06-25 DIAGNOSIS — N186 End stage renal disease: Secondary | ICD-10-CM | POA: Diagnosis not present

## 2015-06-25 DIAGNOSIS — D631 Anemia in chronic kidney disease: Secondary | ICD-10-CM | POA: Diagnosis not present

## 2015-06-25 DIAGNOSIS — N2581 Secondary hyperparathyroidism of renal origin: Secondary | ICD-10-CM | POA: Diagnosis not present

## 2015-06-25 DIAGNOSIS — D509 Iron deficiency anemia, unspecified: Secondary | ICD-10-CM | POA: Diagnosis not present

## 2015-06-26 ENCOUNTER — Ambulatory Visit: Payer: Medicare Other | Admitting: Internal Medicine

## 2015-06-26 DIAGNOSIS — Z7901 Long term (current) use of anticoagulants: Secondary | ICD-10-CM | POA: Diagnosis not present

## 2015-06-26 DIAGNOSIS — R05 Cough: Secondary | ICD-10-CM | POA: Diagnosis not present

## 2015-06-26 DIAGNOSIS — Z6834 Body mass index (BMI) 34.0-34.9, adult: Secondary | ICD-10-CM | POA: Diagnosis not present

## 2015-06-26 DIAGNOSIS — I4891 Unspecified atrial fibrillation: Secondary | ICD-10-CM | POA: Diagnosis not present

## 2015-06-26 DIAGNOSIS — J209 Acute bronchitis, unspecified: Secondary | ICD-10-CM | POA: Diagnosis not present

## 2015-06-26 DIAGNOSIS — R51 Headache: Secondary | ICD-10-CM | POA: Diagnosis not present

## 2015-06-27 DIAGNOSIS — D509 Iron deficiency anemia, unspecified: Secondary | ICD-10-CM | POA: Diagnosis not present

## 2015-06-27 DIAGNOSIS — D631 Anemia in chronic kidney disease: Secondary | ICD-10-CM | POA: Diagnosis not present

## 2015-06-27 DIAGNOSIS — N2581 Secondary hyperparathyroidism of renal origin: Secondary | ICD-10-CM | POA: Diagnosis not present

## 2015-06-27 DIAGNOSIS — N186 End stage renal disease: Secondary | ICD-10-CM | POA: Diagnosis not present

## 2015-06-29 DIAGNOSIS — D631 Anemia in chronic kidney disease: Secondary | ICD-10-CM | POA: Diagnosis not present

## 2015-06-29 DIAGNOSIS — N186 End stage renal disease: Secondary | ICD-10-CM | POA: Diagnosis not present

## 2015-06-29 DIAGNOSIS — D509 Iron deficiency anemia, unspecified: Secondary | ICD-10-CM | POA: Diagnosis not present

## 2015-06-29 DIAGNOSIS — N2581 Secondary hyperparathyroidism of renal origin: Secondary | ICD-10-CM | POA: Diagnosis not present

## 2015-07-01 ENCOUNTER — Encounter: Payer: Self-pay | Admitting: Internal Medicine

## 2015-07-01 ENCOUNTER — Ambulatory Visit (INDEPENDENT_AMBULATORY_CARE_PROVIDER_SITE_OTHER): Payer: Medicare Other | Admitting: Internal Medicine

## 2015-07-01 VITALS — BP 115/71 | HR 100 | Temp 97.8°F | Ht 75.5 in | Wt 282.2 lb

## 2015-07-01 DIAGNOSIS — I10 Essential (primary) hypertension: Secondary | ICD-10-CM

## 2015-07-01 NOTE — Assessment & Plan Note (Signed)
Assessment:  The patient's chronic medical conditions are managed by Dr. Reynaldo Minium and he would like to continue his care with Dr. Reynaldo Minium.  We called and confirmed he is a current patient there.  Patient due for a follow-up appointment in 2 days. Plan:  Medical management per PCP, Dr. Reynaldo Minium.

## 2015-07-01 NOTE — Patient Instructions (Signed)
1. Please be sure to follow-up with your PCP, Dr. Reynaldo Minium tomorrow.    2. Please take all medications as prescribed.    3. If you have worsening of your symptoms or new symptoms arise, please call the clinic [phone] or go to the ER immediately if symptoms are severe.

## 2015-07-01 NOTE — Progress Notes (Signed)
Subjective:    Patient ID: [name], male    DOB: [birth year], 65 y.o.   MRN: [id number]  HPI Comments: [name] is a 65 year old male with PMH as below here to establish care.  However, the patient says he is not sure why he is here.  He says [name] with [facility] is his PCP and he fills his medications.  He was recently prescribed abx (he is not sure which one) and cough med for "bad cold."  He said INR was elevated with this abx so he was instructed to hold coumadin for 3 days.  INR recheck last week was 2-3.  He still has 3 abx pills left.  He will see [name] again and get labs on Wednesday.    He has ESRD (followed by [name]) and started HD (1.5 years ago).  He goes to HD on T-Th-Sat.  Last HD was on Sat.  He will go again tomorrow.      Past Medical History  Diagnosis Date  . DVT (deep venous thrombosis) ([place])     "got one in my right leg now; I've had one before too, not sure which leg" (08/05/2013)  . Diabetic retinopathy   . Hypertension   . Hyperlipidemia   . Shortness of breath   . Peripheral vascular disease ([place])   . Blood transfusion     "years ago; blood was low" (08/05/2013)  . Noncompliance 03/16/2012  . NSVT (nonsustained ventricular tachycardia) ([place]) 03/18/2012  . Anemia   . GERD (gastroesophageal reflux disease)     uses alka seltzere on occas.   . Family history of anesthesia complication     " my son wakes up slowly"  . IDDM (insulin dependent diabetes mellitus) ([place])   . Pneumonia 2013    hosp.-   . Sleep apnea     "suppose to have a sleep study, but they never told me when. (08/05/2013)  . Headache(784.0)     "one q now and then" (08/05/2013)  . Non Hodgkin's lymphoma ([place])     Tx 2009; "had chemo; it went away" (08/05/2013)  . 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 Diabetic nephropathy ([place])   . CKD (chronic kidney disease) stage 4, GFR 15-29 ml/min (HCC) 03/18/2012    [place]- T,TH,Sat.  . ESRD (end stage  renal disease) on dialysis [name])     "just started today, (08/04/2013)"  . Arthritis     HNP- lumbar, "all over my body"  . Poor historian     pt. unsure of several answers to health history questions    Current Outpatient Prescriptions on File Prior to Visit  Medication Sig Dispense Refill  . acetaminophen (TYLENOL) 325 MG tablet Take 650 mg by mouth every 6 (six) hours as needed (pain).    [name] XT 120 MG 24 hr capsule Take 120 mg by mouth daily.    Marland Kitchen HYDROcodone-acetaminophen (NORCO) 5-325 MG tablet Take 1 tablet by mouth every 6 (six) hours as needed for moderate pain. 30 tablet 0  . insulin glargine (LANTUS) 100 UNIT/ML injection Inject 0.3 mLs (30 Units total) into the skin at bedtime. 10 mL 11  . insulin lispro (HUMALOG) 100 UNIT/ML injection Inject 10 Units into the skin 3 (three) times daily with meals.     . multivitamin (RENA-VIT) TABS tablet Take 1 tablet by mouth at bedtime. 30 tablet 0  . polysaccharide iron (NIFEREX) 150 MG CAPS capsule Take  1 capsule (150 mg total) by mouth daily. 30 each 2  . sevelamer carbonate (RENVELA) 800 MG tablet Take 1,600 mg by mouth 3 (three) times daily with meals.    . warfarin (COUMADIN) 5 MG tablet Take 10 mg by mouth every evening.      No current facility-administered medications on file prior to visit.    Review of Systems  Constitutional: Negative for fever, chills and appetite change.  Respiratory: Positive for cough. Negative for shortness of breath.        Cough improving improving on abx and cough syrup.  Cardiovascular: Positive for leg swelling. Negative for chest pain and palpitations.       Intermittent leg swelling  Gastrointestinal: Negative for nausea, vomiting, abdominal pain and diarrhea.  Genitourinary:       Anuric       Filed Vitals:   07/01/15 1507  BP: 115/71  Pulse: 100  Temp: 97.8 F (36.6 C)  TempSrc: Oral  Height: 6' 3.5" (1.918 m)  Weight: 282 lb 3.2 oz (128.005 kg)  SpO2: 100%     Objective:    Physical Exam  Constitutional: He is oriented to person, place, and time. He appears well-developed. No distress.  HENT:  Head: Normocephalic and atraumatic.  Mouth/Throat: Oropharynx is clear and moist. No oropharyngeal exudate.  Eyes: Conjunctivae and EOM are normal. Pupils are equal, round, and reactive to light. Right eye exhibits no discharge. Left eye exhibits no discharge. No scleral icterus.  Neck: Neck supple.  Cardiovascular: Normal rate, regular rhythm and normal heart sounds.  Exam reveals no gallop and no friction rub.   No murmur heard. LUE forearm AVF w/ palpable thrill.  Pulmonary/Chest: Effort normal and breath sounds normal. No respiratory distress. He has no wheezes. He has no rales.  Abdominal: Soft. Bowel sounds are normal. He exhibits no distension and no mass. There is no tenderness. There is no rebound and no guarding.  Musculoskeletal: Normal range of motion. He exhibits edema. He exhibits no tenderness.  2+ B/L LE edema  Neurological: He is alert and oriented to person, place, and time. No cranial nerve deficit.  Skin: Skin is warm. He is not diaphoretic.  Psychiatric: He has a normal mood and affect. His behavior is normal. Judgment and thought content normal.  Vitals reviewed.         Assessment & Plan:  Please see problem based charting for A&P.

## 2015-07-02 DIAGNOSIS — D509 Iron deficiency anemia, unspecified: Secondary | ICD-10-CM | POA: Diagnosis not present

## 2015-07-02 DIAGNOSIS — E1129 Type 2 diabetes mellitus with other diabetic kidney complication: Secondary | ICD-10-CM | POA: Diagnosis not present

## 2015-07-02 DIAGNOSIS — D631 Anemia in chronic kidney disease: Secondary | ICD-10-CM | POA: Diagnosis not present

## 2015-07-02 DIAGNOSIS — Z992 Dependence on renal dialysis: Secondary | ICD-10-CM | POA: Diagnosis not present

## 2015-07-02 DIAGNOSIS — N186 End stage renal disease: Secondary | ICD-10-CM | POA: Diagnosis not present

## 2015-07-02 DIAGNOSIS — N2581 Secondary hyperparathyroidism of renal origin: Secondary | ICD-10-CM | POA: Diagnosis not present

## 2015-07-02 LAB — GLUCOSE, CAPILLARY: Glucose-Capillary: 300 mg/dL — ABNORMAL HIGH (ref 65–99)

## 2015-07-02 NOTE — Progress Notes (Signed)
Case discussed with Dr. Redmond Pulling at the time of the visit. We reviewed the resident's history and exam and pertinent patient test results. I agree with the assessment, diagnosis, and plan of care documented in the resident's note.  He is already a patient of Dr. Reynaldo Minium and it is unclear why he was scheduled here.  As we made no changes in his regimen and he is already scheduled to follow-up with Dr. Reynaldo Minium no charge was submitted.

## 2015-07-03 DIAGNOSIS — Z0000 Encounter for general adult medical examination without abnormal findings: Secondary | ICD-10-CM | POA: Diagnosis not present

## 2015-07-03 DIAGNOSIS — G629 Polyneuropathy, unspecified: Secondary | ICD-10-CM | POA: Diagnosis not present

## 2015-07-03 DIAGNOSIS — I10 Essential (primary) hypertension: Secondary | ICD-10-CM | POA: Diagnosis not present

## 2015-07-03 DIAGNOSIS — E784 Other hyperlipidemia: Secondary | ICD-10-CM | POA: Diagnosis not present

## 2015-07-03 DIAGNOSIS — Z7901 Long term (current) use of anticoagulants: Secondary | ICD-10-CM | POA: Diagnosis not present

## 2015-07-03 DIAGNOSIS — Z6834 Body mass index (BMI) 34.0-34.9, adult: Secondary | ICD-10-CM | POA: Diagnosis not present

## 2015-07-03 DIAGNOSIS — E1139 Type 2 diabetes mellitus with other diabetic ophthalmic complication: Secondary | ICD-10-CM | POA: Diagnosis not present

## 2015-07-03 DIAGNOSIS — E1140 Type 2 diabetes mellitus with diabetic neuropathy, unspecified: Secondary | ICD-10-CM | POA: Diagnosis not present

## 2015-07-03 DIAGNOSIS — N186 End stage renal disease: Secondary | ICD-10-CM | POA: Diagnosis not present

## 2015-07-03 DIAGNOSIS — C8590 Non-Hodgkin lymphoma, unspecified, unspecified site: Secondary | ICD-10-CM | POA: Diagnosis not present

## 2015-07-03 DIAGNOSIS — I4891 Unspecified atrial fibrillation: Secondary | ICD-10-CM | POA: Diagnosis not present

## 2015-07-03 DIAGNOSIS — E668 Other obesity: Secondary | ICD-10-CM | POA: Diagnosis not present

## 2015-07-04 DIAGNOSIS — N186 End stage renal disease: Secondary | ICD-10-CM | POA: Diagnosis not present

## 2015-07-04 DIAGNOSIS — D631 Anemia in chronic kidney disease: Secondary | ICD-10-CM | POA: Diagnosis not present

## 2015-07-04 DIAGNOSIS — N2581 Secondary hyperparathyroidism of renal origin: Secondary | ICD-10-CM | POA: Diagnosis not present

## 2015-07-04 DIAGNOSIS — E1129 Type 2 diabetes mellitus with other diabetic kidney complication: Secondary | ICD-10-CM | POA: Diagnosis not present

## 2015-07-04 DIAGNOSIS — D509 Iron deficiency anemia, unspecified: Secondary | ICD-10-CM | POA: Diagnosis not present

## 2015-07-06 DIAGNOSIS — D631 Anemia in chronic kidney disease: Secondary | ICD-10-CM | POA: Diagnosis not present

## 2015-07-06 DIAGNOSIS — N186 End stage renal disease: Secondary | ICD-10-CM | POA: Diagnosis not present

## 2015-07-06 DIAGNOSIS — N2581 Secondary hyperparathyroidism of renal origin: Secondary | ICD-10-CM | POA: Diagnosis not present

## 2015-07-06 DIAGNOSIS — D509 Iron deficiency anemia, unspecified: Secondary | ICD-10-CM | POA: Diagnosis not present

## 2015-07-06 DIAGNOSIS — E1129 Type 2 diabetes mellitus with other diabetic kidney complication: Secondary | ICD-10-CM | POA: Diagnosis not present

## 2015-07-09 DIAGNOSIS — D509 Iron deficiency anemia, unspecified: Secondary | ICD-10-CM | POA: Diagnosis not present

## 2015-07-09 DIAGNOSIS — E1129 Type 2 diabetes mellitus with other diabetic kidney complication: Secondary | ICD-10-CM | POA: Diagnosis not present

## 2015-07-09 DIAGNOSIS — N186 End stage renal disease: Secondary | ICD-10-CM | POA: Diagnosis not present

## 2015-07-09 DIAGNOSIS — D631 Anemia in chronic kidney disease: Secondary | ICD-10-CM | POA: Diagnosis not present

## 2015-07-09 DIAGNOSIS — N2581 Secondary hyperparathyroidism of renal origin: Secondary | ICD-10-CM | POA: Diagnosis not present

## 2015-07-11 DIAGNOSIS — E1129 Type 2 diabetes mellitus with other diabetic kidney complication: Secondary | ICD-10-CM | POA: Diagnosis not present

## 2015-07-11 DIAGNOSIS — N2581 Secondary hyperparathyroidism of renal origin: Secondary | ICD-10-CM | POA: Diagnosis not present

## 2015-07-11 DIAGNOSIS — D509 Iron deficiency anemia, unspecified: Secondary | ICD-10-CM | POA: Diagnosis not present

## 2015-07-11 DIAGNOSIS — D631 Anemia in chronic kidney disease: Secondary | ICD-10-CM | POA: Diagnosis not present

## 2015-07-11 DIAGNOSIS — N186 End stage renal disease: Secondary | ICD-10-CM | POA: Diagnosis not present

## 2015-07-13 DIAGNOSIS — N2581 Secondary hyperparathyroidism of renal origin: Secondary | ICD-10-CM | POA: Diagnosis not present

## 2015-07-13 DIAGNOSIS — D631 Anemia in chronic kidney disease: Secondary | ICD-10-CM | POA: Diagnosis not present

## 2015-07-13 DIAGNOSIS — D509 Iron deficiency anemia, unspecified: Secondary | ICD-10-CM | POA: Diagnosis not present

## 2015-07-13 DIAGNOSIS — E1129 Type 2 diabetes mellitus with other diabetic kidney complication: Secondary | ICD-10-CM | POA: Diagnosis not present

## 2015-07-13 DIAGNOSIS — N186 End stage renal disease: Secondary | ICD-10-CM | POA: Diagnosis not present

## 2015-07-16 DIAGNOSIS — E1129 Type 2 diabetes mellitus with other diabetic kidney complication: Secondary | ICD-10-CM | POA: Diagnosis not present

## 2015-07-16 DIAGNOSIS — D509 Iron deficiency anemia, unspecified: Secondary | ICD-10-CM | POA: Diagnosis not present

## 2015-07-16 DIAGNOSIS — N186 End stage renal disease: Secondary | ICD-10-CM | POA: Diagnosis not present

## 2015-07-16 DIAGNOSIS — N2581 Secondary hyperparathyroidism of renal origin: Secondary | ICD-10-CM | POA: Diagnosis not present

## 2015-07-16 DIAGNOSIS — D631 Anemia in chronic kidney disease: Secondary | ICD-10-CM | POA: Diagnosis not present

## 2015-07-18 DIAGNOSIS — D631 Anemia in chronic kidney disease: Secondary | ICD-10-CM | POA: Diagnosis not present

## 2015-07-18 DIAGNOSIS — N186 End stage renal disease: Secondary | ICD-10-CM | POA: Diagnosis not present

## 2015-07-18 DIAGNOSIS — D509 Iron deficiency anemia, unspecified: Secondary | ICD-10-CM | POA: Diagnosis not present

## 2015-07-18 DIAGNOSIS — E1129 Type 2 diabetes mellitus with other diabetic kidney complication: Secondary | ICD-10-CM | POA: Diagnosis not present

## 2015-07-18 DIAGNOSIS — N2581 Secondary hyperparathyroidism of renal origin: Secondary | ICD-10-CM | POA: Diagnosis not present

## 2015-07-20 DIAGNOSIS — N186 End stage renal disease: Secondary | ICD-10-CM | POA: Diagnosis not present

## 2015-07-20 DIAGNOSIS — N2581 Secondary hyperparathyroidism of renal origin: Secondary | ICD-10-CM | POA: Diagnosis not present

## 2015-07-20 DIAGNOSIS — E1129 Type 2 diabetes mellitus with other diabetic kidney complication: Secondary | ICD-10-CM | POA: Diagnosis not present

## 2015-07-20 DIAGNOSIS — D631 Anemia in chronic kidney disease: Secondary | ICD-10-CM | POA: Diagnosis not present

## 2015-07-20 DIAGNOSIS — D509 Iron deficiency anemia, unspecified: Secondary | ICD-10-CM | POA: Diagnosis not present

## 2015-07-23 DIAGNOSIS — D631 Anemia in chronic kidney disease: Secondary | ICD-10-CM | POA: Diagnosis not present

## 2015-07-23 DIAGNOSIS — N186 End stage renal disease: Secondary | ICD-10-CM | POA: Diagnosis not present

## 2015-07-23 DIAGNOSIS — E1129 Type 2 diabetes mellitus with other diabetic kidney complication: Secondary | ICD-10-CM | POA: Diagnosis not present

## 2015-07-23 DIAGNOSIS — D509 Iron deficiency anemia, unspecified: Secondary | ICD-10-CM | POA: Diagnosis not present

## 2015-07-23 DIAGNOSIS — N2581 Secondary hyperparathyroidism of renal origin: Secondary | ICD-10-CM | POA: Diagnosis not present

## 2015-07-25 DIAGNOSIS — D509 Iron deficiency anemia, unspecified: Secondary | ICD-10-CM | POA: Diagnosis not present

## 2015-07-25 DIAGNOSIS — E1129 Type 2 diabetes mellitus with other diabetic kidney complication: Secondary | ICD-10-CM | POA: Diagnosis not present

## 2015-07-25 DIAGNOSIS — N186 End stage renal disease: Secondary | ICD-10-CM | POA: Diagnosis not present

## 2015-07-25 DIAGNOSIS — D631 Anemia in chronic kidney disease: Secondary | ICD-10-CM | POA: Diagnosis not present

## 2015-07-25 DIAGNOSIS — N2581 Secondary hyperparathyroidism of renal origin: Secondary | ICD-10-CM | POA: Diagnosis not present

## 2015-07-27 DIAGNOSIS — D631 Anemia in chronic kidney disease: Secondary | ICD-10-CM | POA: Diagnosis not present

## 2015-07-27 DIAGNOSIS — D509 Iron deficiency anemia, unspecified: Secondary | ICD-10-CM | POA: Diagnosis not present

## 2015-07-27 DIAGNOSIS — E1129 Type 2 diabetes mellitus with other diabetic kidney complication: Secondary | ICD-10-CM | POA: Diagnosis not present

## 2015-07-27 DIAGNOSIS — N2581 Secondary hyperparathyroidism of renal origin: Secondary | ICD-10-CM | POA: Diagnosis not present

## 2015-07-27 DIAGNOSIS — N186 End stage renal disease: Secondary | ICD-10-CM | POA: Diagnosis not present

## 2015-07-30 DIAGNOSIS — N2581 Secondary hyperparathyroidism of renal origin: Secondary | ICD-10-CM | POA: Diagnosis not present

## 2015-07-30 DIAGNOSIS — D509 Iron deficiency anemia, unspecified: Secondary | ICD-10-CM | POA: Diagnosis not present

## 2015-07-30 DIAGNOSIS — Z992 Dependence on renal dialysis: Secondary | ICD-10-CM | POA: Diagnosis not present

## 2015-07-30 DIAGNOSIS — E1129 Type 2 diabetes mellitus with other diabetic kidney complication: Secondary | ICD-10-CM | POA: Diagnosis not present

## 2015-07-30 DIAGNOSIS — N186 End stage renal disease: Secondary | ICD-10-CM | POA: Diagnosis not present

## 2015-07-30 DIAGNOSIS — D631 Anemia in chronic kidney disease: Secondary | ICD-10-CM | POA: Diagnosis not present

## 2015-07-31 DIAGNOSIS — I4891 Unspecified atrial fibrillation: Secondary | ICD-10-CM | POA: Diagnosis not present

## 2015-07-31 DIAGNOSIS — I80202 Phlebitis and thrombophlebitis of unspecified deep vessels of left lower extremity: Secondary | ICD-10-CM | POA: Diagnosis not present

## 2015-07-31 DIAGNOSIS — Z7901 Long term (current) use of anticoagulants: Secondary | ICD-10-CM | POA: Diagnosis not present

## 2015-08-01 DIAGNOSIS — D509 Iron deficiency anemia, unspecified: Secondary | ICD-10-CM | POA: Diagnosis not present

## 2015-08-01 DIAGNOSIS — N186 End stage renal disease: Secondary | ICD-10-CM | POA: Diagnosis not present

## 2015-08-01 DIAGNOSIS — D631 Anemia in chronic kidney disease: Secondary | ICD-10-CM | POA: Diagnosis not present

## 2015-08-01 DIAGNOSIS — N2581 Secondary hyperparathyroidism of renal origin: Secondary | ICD-10-CM | POA: Diagnosis not present

## 2015-08-03 DIAGNOSIS — D509 Iron deficiency anemia, unspecified: Secondary | ICD-10-CM | POA: Diagnosis not present

## 2015-08-03 DIAGNOSIS — N2581 Secondary hyperparathyroidism of renal origin: Secondary | ICD-10-CM | POA: Diagnosis not present

## 2015-08-03 DIAGNOSIS — N186 End stage renal disease: Secondary | ICD-10-CM | POA: Diagnosis not present

## 2015-08-03 DIAGNOSIS — D631 Anemia in chronic kidney disease: Secondary | ICD-10-CM | POA: Diagnosis not present

## 2015-08-05 ENCOUNTER — Ambulatory Visit (INDEPENDENT_AMBULATORY_CARE_PROVIDER_SITE_OTHER): Payer: Medicare Other | Admitting: Internal Medicine

## 2015-08-05 ENCOUNTER — Other Ambulatory Visit: Payer: Self-pay | Admitting: Internal Medicine

## 2015-08-05 ENCOUNTER — Encounter: Payer: Self-pay | Admitting: Internal Medicine

## 2015-08-05 VITALS — BP 100/64 | HR 72 | Ht 75.5 in | Wt 274.2 lb

## 2015-08-05 DIAGNOSIS — Z7901 Long term (current) use of anticoagulants: Secondary | ICD-10-CM

## 2015-08-05 DIAGNOSIS — N63 Unspecified lump in breast: Secondary | ICD-10-CM

## 2015-08-05 DIAGNOSIS — N186 End stage renal disease: Secondary | ICD-10-CM

## 2015-08-05 DIAGNOSIS — Z86010 Personal history of colonic polyps: Secondary | ICD-10-CM

## 2015-08-05 DIAGNOSIS — E119 Type 2 diabetes mellitus without complications: Secondary | ICD-10-CM

## 2015-08-05 DIAGNOSIS — N6310 Unspecified lump in the right breast, unspecified quadrant: Secondary | ICD-10-CM

## 2015-08-05 DIAGNOSIS — IMO0001 Reserved for inherently not codable concepts without codable children: Secondary | ICD-10-CM

## 2015-08-05 DIAGNOSIS — Z794 Long term (current) use of insulin: Secondary | ICD-10-CM

## 2015-08-05 NOTE — Progress Notes (Signed)
HISTORY OF PRESENT ILLNESS:  [name] is a 65 y.o. male with MULTIPLE SIGNIFICANT medical problems who presents today regarding surveillance colonoscopy. The patient has a history of end-stage renal disease for which he is on hemodialysis Tuesday, Thursday, Saturday. He also has a history of right lower extremity DVT for which he is on chronic Coumadin therapy. He has long-standing insulin requiring diabetes mellitus. He has a history of non-Hodgkin's lymphoma for which he was treated. He has a history of adenomatous colon polyps with multiple adenomas in 2006. Follow-up colonoscopy in June 2010 revealed 2 adenomatous which were removed. His preparation was fair/adequate. Follow-up in 4 years with split prep recommended. He was aware but chose to follow through this time. His GI review of systems is unremarkable. In particular, no change in bowel habits or rectal bleeding.  REVIEW OF SYSTEMS:  All non-GI ROS negative except for right breast pain (requests evaluation)  Past Medical History  Diagnosis Date  . DVT (deep venous thrombosis) ([place])     "got one in my right leg now; I've had one before too, not sure which leg" (08/05/2013)  . Diabetic retinopathy   . Hypertension   . Hyperlipidemia   . Shortness of breath   . Peripheral vascular disease ([place])   . Blood transfusion     "years ago; blood was low" (08/05/2013)  . Noncompliance 03/16/2012  . NSVT (nonsustained ventricular tachycardia) ([place]) 03/18/2012  . Anemia   . GERD (gastroesophageal reflux disease)     uses alka seltzere on occas.   . Family history of anesthesia complication     " my son wakes up slowly"  . IDDM (insulin dependent diabetes mellitus) ([place])   . Pneumonia 2013    hosp.-   . Sleep apnea     "suppose to have a sleep study, but they never told me when. (08/05/2013)  . Headache(784.0)     "one q now and then" (08/05/2013)  . Non Hodgkin's lymphoma ([place])     Tx 2009; "had chemo; it went away" (08/05/2013)  . Nodular  lymphoma of intra-abdominal lymph nodes ([place])   . Diabetic nephropathy ([place])   . CKD (chronic kidney disease) stage 4, GFR 15-29 ml/min (HCC) 03/18/2012    [place]- T,TH,Sat.  . ESRD (end stage renal disease) on dialysis [facility])     "just started today, (08/04/2013)"  . Arthritis     HNP- lumbar, "all over my body"  . Poor historian     pt. unsure of several answers to health history questions     Past Surgical History  Procedure Laterality Date  . Portacath placement    . Port-a-cath removal    . Coloscopy    . Pars plana vitrectomy  08/27/2011    Procedure: PARS PLANA VITRECTOMY WITH 25 GAUGE;  Surgeon: Hayden Pedro, MD;  Location: [facility];  Service: Ophthalmology;  Laterality: Left;  Repair of complex traction retinal detachment left eye  . Eye surgery Bilateral   . Pars plana vitrectomy  05/10/2012    Procedure: PARS PLANA VITRECTOMY WITH 25 GAUGE;  Surgeon: Hayden Pedro, MD;  Location: [facility];  Service: Ophthalmology;  Laterality: Right;  Repair Complex Traction Retinal Detachment  . Membrane peel  05/10/2012    Procedure: MEMBRANE PEEL;  Surgeon: Hayden Pedro, MD;  Location: [place];  Service: Ophthalmology;  Laterality: Right;  . Photocoagulation with laser  05/10/2012    Procedure: PHOTOCOAGULATION WITH LASER;  Surgeon: Hayden Pedro, MD;  Location: [facility];  Service: Ophthalmology;  Laterality: Right;  . Gas insertion  05/10/2012    Procedure: INSERTION OF GAS;  Surgeon: Hayden Pedro, MD;  Location: [facility];  Service: Ophthalmology;  Laterality: Right;  . Vena cava filter placement  09/2012    due to preparation for surgery  . Av fistula placement Left 02/03/2013    Procedure: ARTERIOVENOUS (AV) FISTULA CREATION- LEFT RADIAL CEPHALIC; ULTRASOUND GUIDED;  Surgeon: Mal Misty, MD;  Location: [facility];  Service: Vascular;  Laterality: Left;  . Cardiac catheterization    . Lesion excision Right 05/08/2015    Procedure: EXCISION SCALP LESION;  Surgeon: Erroll Luna, MD;  Location: [facility];  Service: General;  Laterality: Right;    Social History Donata Clay  reports that he has never smoked. He has never used smokeless tobacco. He reports that he does not drink alcohol or use illicit drugs.  family history includes Anesthesia problems in his son; Diabetes in his father; Hypertension in his father and son; Other in his father.  No Known Allergies     PHYSICAL EXAMINATION: Vital signs: BP 100/64 mmHg  Pulse 72  Ht 6' 3.5" (1.918 m)  Wt 274 lb 4 oz (124.399 kg)  BMI 33.82 kg/m2  Constitutional: Pleasant, chronically ill-appearing , no acute distress Psychiatric: alert and oriented x3, cooperative Eyes: extraocular movements intact, anicteric, conjunctiva pink Mouth: oral pharynx moist, no lesions Neck: supple without thyromegaly Lymph no obvious lymphadenopathy Breasts: Firm tender right breast mass Cardiovascular: heart regular rate and rhythm, no murmur Lungs: clear to auscultation bilaterally Abdomen: soft, nontender, nondistended, no obvious ascites, no peritoneal signs, normal bowel sounds, no organomegaly Rectal: Deferred until colonoscopy Extremities: Left upper extremity AV graft. no clubbing cyanosis or significant lower extremity edema bilaterally Skin: no lesions on visible extremities Neuro: No focal deficits. Cranial nerves intact  ASSESSMENT:  #1. Firm tender right breast mass. Rule out cancer #2. History of adenomatous colon polyps. Overdue for surveillance #3. High-risk patient with multiple significant medical problems and issues including dialysis, insulin requiring diabetes, and chronic anticoagulation   PLAN:  #1. Mammogram ASAP #2. Surgical referral thereafter #3. Timing of colonoscopy to be determined. We will need to adjust his insulin, hold anticoagulation with approval from his PCP, and plan Monday appointment to work around dialysis schedule.The nature of the procedure, as well as the risks, benefits,  and alternatives were carefully and thoroughly reviewed with the patient. Ample time for discussion and questions allowed. The patient understood, was satisfied, and agreed to proceed.

## 2015-08-05 NOTE — Patient Instructions (Signed)
You have been scheduled for an appointment with the [facility] on 08/09/2015 at 9:30am.  Please arrive at 9:10am with your insurance card and id.  The address is: [address], [place], [place] [address]  Phone: [phone]

## 2015-08-06 DIAGNOSIS — N2581 Secondary hyperparathyroidism of renal origin: Secondary | ICD-10-CM | POA: Diagnosis not present

## 2015-08-06 DIAGNOSIS — D509 Iron deficiency anemia, unspecified: Secondary | ICD-10-CM | POA: Diagnosis not present

## 2015-08-06 DIAGNOSIS — D631 Anemia in chronic kidney disease: Secondary | ICD-10-CM | POA: Diagnosis not present

## 2015-08-06 DIAGNOSIS — N186 End stage renal disease: Secondary | ICD-10-CM | POA: Diagnosis not present

## 2015-08-08 DIAGNOSIS — D509 Iron deficiency anemia, unspecified: Secondary | ICD-10-CM | POA: Diagnosis not present

## 2015-08-08 DIAGNOSIS — N2581 Secondary hyperparathyroidism of renal origin: Secondary | ICD-10-CM | POA: Diagnosis not present

## 2015-08-08 DIAGNOSIS — D631 Anemia in chronic kidney disease: Secondary | ICD-10-CM | POA: Diagnosis not present

## 2015-08-08 DIAGNOSIS — N186 End stage renal disease: Secondary | ICD-10-CM | POA: Diagnosis not present

## 2015-08-09 ENCOUNTER — Other Ambulatory Visit: Payer: Self-pay | Admitting: Internal Medicine

## 2015-08-09 ENCOUNTER — Ambulatory Visit
Admission: RE | Admit: 2015-08-09 | Discharge: 2015-08-09 | Disposition: A | Discharge status: Home / Self Care | Payer: Medicare Other | Source: Ambulatory Visit | Attending: Internal Medicine | Admitting: Internal Medicine

## 2015-08-09 DIAGNOSIS — N62 Hypertrophy of breast: Secondary | ICD-10-CM | POA: Diagnosis not present

## 2015-08-09 DIAGNOSIS — N6310 Unspecified lump in the right breast, unspecified quadrant: Secondary | ICD-10-CM

## 2015-08-10 DIAGNOSIS — D631 Anemia in chronic kidney disease: Secondary | ICD-10-CM | POA: Diagnosis not present

## 2015-08-10 DIAGNOSIS — N2581 Secondary hyperparathyroidism of renal origin: Secondary | ICD-10-CM | POA: Diagnosis not present

## 2015-08-10 DIAGNOSIS — D509 Iron deficiency anemia, unspecified: Secondary | ICD-10-CM | POA: Diagnosis not present

## 2015-08-10 DIAGNOSIS — N186 End stage renal disease: Secondary | ICD-10-CM | POA: Diagnosis not present

## 2015-08-12 ENCOUNTER — Other Ambulatory Visit: Payer: Self-pay

## 2015-08-12 DIAGNOSIS — Z1211 Encounter for screening for malignant neoplasm of colon: Secondary | ICD-10-CM

## 2015-08-13 DIAGNOSIS — D631 Anemia in chronic kidney disease: Secondary | ICD-10-CM | POA: Diagnosis not present

## 2015-08-13 DIAGNOSIS — D509 Iron deficiency anemia, unspecified: Secondary | ICD-10-CM | POA: Diagnosis not present

## 2015-08-13 DIAGNOSIS — N2581 Secondary hyperparathyroidism of renal origin: Secondary | ICD-10-CM | POA: Diagnosis not present

## 2015-08-13 DIAGNOSIS — N186 End stage renal disease: Secondary | ICD-10-CM | POA: Diagnosis not present

## 2015-08-15 DIAGNOSIS — D631 Anemia in chronic kidney disease: Secondary | ICD-10-CM | POA: Diagnosis not present

## 2015-08-15 DIAGNOSIS — N2581 Secondary hyperparathyroidism of renal origin: Secondary | ICD-10-CM | POA: Diagnosis not present

## 2015-08-15 DIAGNOSIS — N186 End stage renal disease: Secondary | ICD-10-CM | POA: Diagnosis not present

## 2015-08-15 DIAGNOSIS — D509 Iron deficiency anemia, unspecified: Secondary | ICD-10-CM | POA: Diagnosis not present

## 2015-08-16 DIAGNOSIS — Z992 Dependence on renal dialysis: Secondary | ICD-10-CM | POA: Diagnosis not present

## 2015-08-16 DIAGNOSIS — T82868D Thrombosis of vascular prosthetic devices, implants and grafts, subsequent encounter: Secondary | ICD-10-CM | POA: Diagnosis not present

## 2015-08-16 DIAGNOSIS — N186 End stage renal disease: Secondary | ICD-10-CM | POA: Diagnosis not present

## 2015-08-16 DIAGNOSIS — I871 Compression of vein: Secondary | ICD-10-CM | POA: Diagnosis not present

## 2015-08-17 DIAGNOSIS — N186 End stage renal disease: Secondary | ICD-10-CM | POA: Diagnosis not present

## 2015-08-17 DIAGNOSIS — D631 Anemia in chronic kidney disease: Secondary | ICD-10-CM | POA: Diagnosis not present

## 2015-08-17 DIAGNOSIS — D509 Iron deficiency anemia, unspecified: Secondary | ICD-10-CM | POA: Diagnosis not present

## 2015-08-17 DIAGNOSIS — N2581 Secondary hyperparathyroidism of renal origin: Secondary | ICD-10-CM | POA: Diagnosis not present

## 2015-08-19 ENCOUNTER — Other Ambulatory Visit: Payer: Self-pay | Admitting: *Deleted

## 2015-08-19 ENCOUNTER — Ambulatory Visit (INDEPENDENT_AMBULATORY_CARE_PROVIDER_SITE_OTHER): Payer: Medicare Other | Admitting: Ophthalmology

## 2015-08-19 DIAGNOSIS — I10 Essential (primary) hypertension: Secondary | ICD-10-CM

## 2015-08-19 DIAGNOSIS — H35033 Hypertensive retinopathy, bilateral: Secondary | ICD-10-CM

## 2015-08-19 DIAGNOSIS — Z01810 Encounter for preprocedural cardiovascular examination: Secondary | ICD-10-CM

## 2015-08-19 DIAGNOSIS — N186 End stage renal disease: Secondary | ICD-10-CM

## 2015-08-19 DIAGNOSIS — E11319 Type 2 diabetes mellitus with unspecified diabetic retinopathy without macular edema: Secondary | ICD-10-CM

## 2015-08-19 DIAGNOSIS — E113593 Type 2 diabetes mellitus with proliferative diabetic retinopathy without macular edema, bilateral: Secondary | ICD-10-CM

## 2015-08-19 DIAGNOSIS — T82510A Breakdown (mechanical) of surgically created arteriovenous fistula, initial encounter: Secondary | ICD-10-CM

## 2015-08-20 DIAGNOSIS — D631 Anemia in chronic kidney disease: Secondary | ICD-10-CM | POA: Diagnosis not present

## 2015-08-20 DIAGNOSIS — N186 End stage renal disease: Secondary | ICD-10-CM | POA: Diagnosis not present

## 2015-08-20 DIAGNOSIS — D509 Iron deficiency anemia, unspecified: Secondary | ICD-10-CM | POA: Diagnosis not present

## 2015-08-20 DIAGNOSIS — N2581 Secondary hyperparathyroidism of renal origin: Secondary | ICD-10-CM | POA: Diagnosis not present

## 2015-08-22 DIAGNOSIS — D509 Iron deficiency anemia, unspecified: Secondary | ICD-10-CM | POA: Diagnosis not present

## 2015-08-22 DIAGNOSIS — D631 Anemia in chronic kidney disease: Secondary | ICD-10-CM | POA: Diagnosis not present

## 2015-08-22 DIAGNOSIS — N2581 Secondary hyperparathyroidism of renal origin: Secondary | ICD-10-CM | POA: Diagnosis not present

## 2015-08-22 DIAGNOSIS — N186 End stage renal disease: Secondary | ICD-10-CM | POA: Diagnosis not present

## 2015-08-24 DIAGNOSIS — D509 Iron deficiency anemia, unspecified: Secondary | ICD-10-CM | POA: Diagnosis not present

## 2015-08-24 DIAGNOSIS — N186 End stage renal disease: Secondary | ICD-10-CM | POA: Diagnosis not present

## 2015-08-24 DIAGNOSIS — D631 Anemia in chronic kidney disease: Secondary | ICD-10-CM | POA: Diagnosis not present

## 2015-08-24 DIAGNOSIS — N2581 Secondary hyperparathyroidism of renal origin: Secondary | ICD-10-CM | POA: Diagnosis not present

## 2015-08-27 DIAGNOSIS — D631 Anemia in chronic kidney disease: Secondary | ICD-10-CM | POA: Diagnosis not present

## 2015-08-27 DIAGNOSIS — N2581 Secondary hyperparathyroidism of renal origin: Secondary | ICD-10-CM | POA: Diagnosis not present

## 2015-08-27 DIAGNOSIS — N186 End stage renal disease: Secondary | ICD-10-CM | POA: Diagnosis not present

## 2015-08-27 DIAGNOSIS — D509 Iron deficiency anemia, unspecified: Secondary | ICD-10-CM | POA: Diagnosis not present

## 2015-08-28 DIAGNOSIS — Z7901 Long term (current) use of anticoagulants: Secondary | ICD-10-CM | POA: Diagnosis not present

## 2015-08-28 DIAGNOSIS — I4891 Unspecified atrial fibrillation: Secondary | ICD-10-CM | POA: Diagnosis not present

## 2015-08-28 DIAGNOSIS — I80202 Phlebitis and thrombophlebitis of unspecified deep vessels of left lower extremity: Secondary | ICD-10-CM | POA: Diagnosis not present

## 2015-08-29 DIAGNOSIS — N186 End stage renal disease: Secondary | ICD-10-CM | POA: Diagnosis not present

## 2015-08-29 DIAGNOSIS — D631 Anemia in chronic kidney disease: Secondary | ICD-10-CM | POA: Diagnosis not present

## 2015-08-29 DIAGNOSIS — N2581 Secondary hyperparathyroidism of renal origin: Secondary | ICD-10-CM | POA: Diagnosis not present

## 2015-08-29 DIAGNOSIS — D509 Iron deficiency anemia, unspecified: Secondary | ICD-10-CM | POA: Diagnosis not present

## 2015-08-30 DIAGNOSIS — Z992 Dependence on renal dialysis: Secondary | ICD-10-CM | POA: Diagnosis not present

## 2015-08-30 DIAGNOSIS — E1129 Type 2 diabetes mellitus with other diabetic kidney complication: Secondary | ICD-10-CM | POA: Diagnosis not present

## 2015-08-30 DIAGNOSIS — N186 End stage renal disease: Secondary | ICD-10-CM | POA: Diagnosis not present

## 2015-08-31 DIAGNOSIS — N2581 Secondary hyperparathyroidism of renal origin: Secondary | ICD-10-CM | POA: Diagnosis not present

## 2015-08-31 DIAGNOSIS — N186 End stage renal disease: Secondary | ICD-10-CM | POA: Diagnosis not present

## 2015-08-31 DIAGNOSIS — D509 Iron deficiency anemia, unspecified: Secondary | ICD-10-CM | POA: Diagnosis not present

## 2015-08-31 DIAGNOSIS — D631 Anemia in chronic kidney disease: Secondary | ICD-10-CM | POA: Diagnosis not present

## 2015-09-02 ENCOUNTER — Ambulatory Visit (AMBULATORY_SURGERY_CENTER): Payer: Self-pay

## 2015-09-02 VITALS — Ht 75.5 in | Wt 271.0 lb

## 2015-09-02 DIAGNOSIS — Z860100 Personal history of colon polyps, unspecified: Secondary | ICD-10-CM

## 2015-09-02 MED ORDER — SUPREP BOWEL PREP KIT 17.5-3.13-1.6 GM/177ML PO SOLN: 1.0000 | Freq: Once | ORAL | Status: DC

## 2015-09-02 NOTE — Progress Notes (Signed)
No allergies to eggs or soy No past problems with anesthesia No diet/weight loss meds No home oxygen  Has email and internet; refused emmi

## 2015-09-03 DIAGNOSIS — N2581 Secondary hyperparathyroidism of renal origin: Secondary | ICD-10-CM | POA: Diagnosis not present

## 2015-09-03 DIAGNOSIS — N186 End stage renal disease: Secondary | ICD-10-CM | POA: Diagnosis not present

## 2015-09-03 DIAGNOSIS — D631 Anemia in chronic kidney disease: Secondary | ICD-10-CM | POA: Diagnosis not present

## 2015-09-03 DIAGNOSIS — D509 Iron deficiency anemia, unspecified: Secondary | ICD-10-CM | POA: Diagnosis not present

## 2015-09-05 DIAGNOSIS — N186 End stage renal disease: Secondary | ICD-10-CM | POA: Diagnosis not present

## 2015-09-05 DIAGNOSIS — N2581 Secondary hyperparathyroidism of renal origin: Secondary | ICD-10-CM | POA: Diagnosis not present

## 2015-09-05 DIAGNOSIS — D509 Iron deficiency anemia, unspecified: Secondary | ICD-10-CM | POA: Diagnosis not present

## 2015-09-05 DIAGNOSIS — D631 Anemia in chronic kidney disease: Secondary | ICD-10-CM | POA: Diagnosis not present

## 2015-09-06 ENCOUNTER — Encounter: Payer: Self-pay | Admitting: Surgery

## 2015-09-07 DIAGNOSIS — D631 Anemia in chronic kidney disease: Secondary | ICD-10-CM | POA: Diagnosis not present

## 2015-09-07 DIAGNOSIS — N186 End stage renal disease: Secondary | ICD-10-CM | POA: Diagnosis not present

## 2015-09-07 DIAGNOSIS — D509 Iron deficiency anemia, unspecified: Secondary | ICD-10-CM | POA: Diagnosis not present

## 2015-09-07 DIAGNOSIS — N2581 Secondary hyperparathyroidism of renal origin: Secondary | ICD-10-CM | POA: Diagnosis not present

## 2015-09-09 ENCOUNTER — Ambulatory Visit (HOSPITAL_COMMUNITY)
Admission: RE | Admit: 2015-09-09 | Discharge: 2015-09-09 | Disposition: A | Discharge status: Home / Self Care | Payer: Medicare Other | Source: Ambulatory Visit | Attending: Vascular Surgery | Admitting: Vascular Surgery

## 2015-09-09 ENCOUNTER — Ambulatory Visit (INDEPENDENT_AMBULATORY_CARE_PROVIDER_SITE_OTHER)
Admission: RE | Admit: 2015-09-09 | Discharge: 2015-09-09 | Disposition: A | Discharge status: Home / Self Care | Payer: Medicare Other | Source: Ambulatory Visit | Attending: Surgery | Admitting: Surgery

## 2015-09-09 DIAGNOSIS — I82622 Acute embolism and thrombosis of deep veins of left upper extremity: Secondary | ICD-10-CM | POA: Diagnosis not present

## 2015-09-09 DIAGNOSIS — K219 Gastro-esophageal reflux disease without esophagitis: Secondary | ICD-10-CM | POA: Insufficient documentation

## 2015-09-09 DIAGNOSIS — I120 Hypertensive chronic kidney disease with stage 5 chronic kidney disease or end stage renal disease: Secondary | ICD-10-CM | POA: Diagnosis not present

## 2015-09-09 DIAGNOSIS — E1122 Type 2 diabetes mellitus with diabetic chronic kidney disease: Secondary | ICD-10-CM | POA: Insufficient documentation

## 2015-09-09 DIAGNOSIS — Z01810 Encounter for preprocedural cardiovascular examination: Secondary | ICD-10-CM | POA: Diagnosis not present

## 2015-09-09 DIAGNOSIS — E785 Hyperlipidemia, unspecified: Secondary | ICD-10-CM | POA: Insufficient documentation

## 2015-09-09 DIAGNOSIS — T82510A Breakdown (mechanical) of surgically created arteriovenous fistula, initial encounter: Secondary | ICD-10-CM

## 2015-09-09 DIAGNOSIS — E11319 Type 2 diabetes mellitus with unspecified diabetic retinopathy without macular edema: Secondary | ICD-10-CM | POA: Diagnosis not present

## 2015-09-09 DIAGNOSIS — N186 End stage renal disease: Secondary | ICD-10-CM

## 2015-09-09 DIAGNOSIS — I82890 Acute embolism and thrombosis of other specified veins: Secondary | ICD-10-CM | POA: Diagnosis not present

## 2015-09-09 DIAGNOSIS — Y832 Surgical operation with anastomosis, bypass or graft as the cause of abnormal reaction of the patient, or of later complication, without mention of misadventure at the time of the procedure: Secondary | ICD-10-CM | POA: Insufficient documentation

## 2015-09-10 DIAGNOSIS — D509 Iron deficiency anemia, unspecified: Secondary | ICD-10-CM | POA: Diagnosis not present

## 2015-09-10 DIAGNOSIS — N186 End stage renal disease: Secondary | ICD-10-CM | POA: Diagnosis not present

## 2015-09-10 DIAGNOSIS — D631 Anemia in chronic kidney disease: Secondary | ICD-10-CM | POA: Diagnosis not present

## 2015-09-10 DIAGNOSIS — N2581 Secondary hyperparathyroidism of renal origin: Secondary | ICD-10-CM | POA: Diagnosis not present

## 2015-09-12 DIAGNOSIS — N2581 Secondary hyperparathyroidism of renal origin: Secondary | ICD-10-CM | POA: Diagnosis not present

## 2015-09-12 DIAGNOSIS — N186 End stage renal disease: Secondary | ICD-10-CM | POA: Diagnosis not present

## 2015-09-12 DIAGNOSIS — D631 Anemia in chronic kidney disease: Secondary | ICD-10-CM | POA: Diagnosis not present

## 2015-09-12 DIAGNOSIS — D509 Iron deficiency anemia, unspecified: Secondary | ICD-10-CM | POA: Diagnosis not present

## 2015-09-14 DIAGNOSIS — D509 Iron deficiency anemia, unspecified: Secondary | ICD-10-CM | POA: Diagnosis not present

## 2015-09-14 DIAGNOSIS — N2581 Secondary hyperparathyroidism of renal origin: Secondary | ICD-10-CM | POA: Diagnosis not present

## 2015-09-14 DIAGNOSIS — D631 Anemia in chronic kidney disease: Secondary | ICD-10-CM | POA: Diagnosis not present

## 2015-09-14 DIAGNOSIS — N186 End stage renal disease: Secondary | ICD-10-CM | POA: Diagnosis not present

## 2015-09-16 ENCOUNTER — Encounter: Payer: Self-pay | Admitting: Surgery

## 2015-09-16 ENCOUNTER — Encounter (HOSPITAL_COMMUNITY): Payer: Self-pay | Admitting: *Deleted

## 2015-09-16 ENCOUNTER — Ambulatory Visit (INDEPENDENT_AMBULATORY_CARE_PROVIDER_SITE_OTHER): Payer: Medicare Other | Admitting: Surgery

## 2015-09-16 VITALS — BP 109/76 | HR 103 | Temp 96.9°F | Resp 18 | Ht 75.5 in | Wt 264.0 lb

## 2015-09-16 DIAGNOSIS — N186 End stage renal disease: Secondary | ICD-10-CM

## 2015-09-16 DIAGNOSIS — Z992 Dependence on renal dialysis: Secondary | ICD-10-CM | POA: Diagnosis not present

## 2015-09-16 NOTE — Progress Notes (Signed)
Patient name: [name] MRN: [id number] DOB: 19-Jun-1950 Sex: male     Chief Complaint  Patient presents with  . Re-evaluation    Decreased flow in Left FA AVF per Dr. Posey Pronto (fistulogram 08-16-15)  Had Vein mapping here on 09-09-15       Had HD on Tues, Thurs and Sat at [facility] in [facility])    HISTORY OF PRESENT ILLNESS: The patient is here today for follow-up and discussions regarding dialysis access.  The patient has a history of a left radiocephalic fistula by Dr. Kellie Simmering in 2014.  He has been having difficulty with bleeding after access.  He states that he has a greenlight during his dialysis.  He recently had a fistulogram secondary to poor flow.  The patient has a history of DVT and is on Coumadin.  Past Medical History  Diagnosis Date  . DVT (deep venous thrombosis) ([place])     "got one in my right leg now; I've had one before too, not sure which leg" (08/05/2013)  . Diabetic retinopathy   . Hypertension   . Hyperlipidemia   . Peripheral vascular disease ([place])   . Blood transfusion     "years ago; blood was low" (08/05/2013)  . Noncompliance 03/16/2012  . NSVT (nonsustained ventricular tachycardia) ([place]) 03/18/2012  . Anemia   . GERD (gastroesophageal reflux disease)     uses alka seltzere on occas.   . Family history of anesthesia complication     " my son wakes up slowly"  . IDDM (insulin dependent diabetes mellitus) ([place])   . Pneumonia 2013    hosp.-   [name])     "one q now and then" (08/05/2013)  . Non Hodgkin's lymphoma ([place])     Tx 2009; "had chemo; it went away" (08/05/2013)  . 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 Diabetic nephropathy ([place])   . CKD (chronic kidney disease) stage 4, GFR 15-29 ml/min (HCC) 03/18/2012    [place]- T,TH,Sat.  . ESRD (end stage renal disease) on dialysis [facility])     "just started today, (08/04/2013)"  . Arthritis     HNP- lumbar, "all over my body"  . Poor  historian     pt. unsure of several answers to health history questions   . Sleep apnea     "suppose to have a sleep study, but they never told me when. (08/05/2013)    Past Surgical History  Procedure Laterality Date  . Portacath placement    . Port-a-cath removal    . Coloscopy    . Pars plana vitrectomy  08/27/2011    Procedure: PARS PLANA VITRECTOMY WITH 25 GAUGE;  Surgeon: [place] Pedro, MD;  Location: [place];  Service: Ophthalmology;  Laterality: Left;  Repair of complex traction retinal detachment left eye  . Eye surgery Bilateral   . Pars plana vitrectomy  05/10/2012    Procedure: PARS PLANA VITRECTOMY WITH 25 GAUGE;  Surgeon: [place] Pedro, MD;  Location: [place];  Service: Ophthalmology;  Laterality: Right;  Repair Complex Traction Retinal Detachment  . Membrane peel  05/10/2012    Procedure: MEMBRANE PEEL;  Surgeon: [place] Pedro, MD;  Location: [place];  Service: Ophthalmology;  Laterality: Right;  . Photocoagulation with laser  05/10/2012    Procedure: PHOTOCOAGULATION WITH LASER;  Surgeon: [place] Pedro, MD;  Location: [place];  Service: Ophthalmology;  Laterality: Right;  . Gas insertion  05/10/2012  Procedure: INSERTION OF GAS;  Surgeon: [place] Pedro, MD;  Location: [place];  Service: Ophthalmology;  Laterality: Right;  . Vena cava filter placement  09/2012    due to preparation for surgery  . Av fistula placement Left 02/03/2013    Procedure: ARTERIOVENOUS (AV) FISTULA CREATION- LEFT RADIAL CEPHALIC; ULTRASOUND GUIDED;  Surgeon: Mal Misty, MD;  Location: [place];  Service: Vascular;  Laterality: Left;  . Cardiac catheterization    . Lesion excision Right 05/08/2015    Procedure: EXCISION SCALP LESION;  Surgeon: [place] Luna, MD;  Location: [place];  Service: General;  Laterality: Right;    Social History   Social History  . Marital Status: Single    Spouse Name: N/A  . Number of Children: 1  . Years of Education: N/A   Occupational History  . Retired       Administrator   Social History Main Topics  . Smoking status: Never Smoker   . Smokeless tobacco: Never Used  . Alcohol Use: No  . Drug Use: No  . Sexual Activity: Not Currently   Other Topics Concern  . Not on file   Social History Narrative    Family History  Problem Relation Age of Onset  . Anesthesia problems Son   . Hypertension Son   . Diabetes Father   . Hypertension Father   . Other Father     amputation  . Colon cancer Neg Hx     Allergies as of 09/16/2015  . (No Known Allergies)    Current Outpatient Prescriptions on File Prior to Visit  Medication Sig Dispense Refill  . acetaminophen (TYLENOL) 325 MG tablet Take 650 mg by mouth every 6 (six) hours as needed (pain).    [name] 120 MG 24 hr capsule Take 120 mg by mouth daily.    Marland Kitchen HYDROcodone-acetaminophen (NORCO) 5-325 MG tablet Take 1 tablet by mouth every 6 (six) hours as needed for moderate pain. 30 tablet 0  . insulin glargine (LANTUS) 100 UNIT/ML injection Inject 0.3 mLs (30 Units total) into the skin at bedtime. 10 mL 11  . insulin lispro (HUMALOG) 100 UNIT/ML injection Inject 10 Units into the skin 3 (three) times daily with meals.     . lidocaine-prilocaine (EMLA) cream APPLY SMALL AMOUNT TO ACCESS SITE 1 TO 2 HOURS BEFORE DIALYSIS. COVER WITH OCCLUSIVE DRESSING (SARAN WRAP).  6  . multivitamin (RENA-VIT) TABS tablet Take 1 tablet by mouth at bedtime. 30 tablet 0  . polysaccharide iron (NIFEREX) 150 MG CAPS capsule Take 1 capsule (150 mg total) by mouth daily. 30 each 2  . sevelamer carbonate (RENVELA) 800 MG tablet Take 1,600 mg by mouth 3 (three) times daily with meals.    Manus Gunning BOWEL PREP SOLN Take 1 kit by mouth once. 354 mL 0  . warfarin (COUMADIN) 5 MG tablet Take 7.5 mg by mouth every evening.      No current facility-administered medications on file prior to visit.     REVIEW OF SYSTEMS: Cardiovascular: No chest pain, chest pressure, palpitations, orthopnea, or dyspnea on exertion.  Positive for leg swelling.  Positive DVT Pulmonary: No productive cough, asthma or wheezing. Neurologic: No weakness, paresthesias, aphasia, or amaurosis. No dizziness. Hematologic: No bleeding problems or clotting disorders. Musculoskeletal: No joint pain or joint swelling. Gastrointestinal: On dialysis Genitourinary: No dysuria or hematuria. Psychiatric:: No history of major depression. Integumentary: No rashes or ulcers. Constitutional: No fever or chills.  PHYSICAL EXAMINATION:   Vital signs  are  Filed Vitals:   09/16/15 1133  BP: 109/76  Pulse: 103  Temp: 96.9 F (36.1 C)  TempSrc: Oral  Resp: 18  Height: 6' 3.5" (1.918 m)  Weight: 264 lb (119.75 kg)  SpO2: 97%   Body mass index is 32.55 kg/(m^2). General: The patient appears their stated age. HEENT:  No gross abnormalities Pulmonary:  Non labored breathing Abdomen: Soft and non-tender Musculoskeletal: There are no major deformities. Neurologic: No focal weakness or paresthesias are detected, Skin: There are no ulcer or rashes noted. Psychiatric: The patient has normal affect. Cardiovascular: Palpable thrill within left radiocephalic fistula with aneurysmal changes in 2 separate areas of the forearm fistula.  Neither is large enough for surgical revision   Diagnostic Studies The patient had vein mapping today.  On the left it appears that he has very poor outflow from the radiocephalic fistula again the antecubital crease.  On the right the cephalic vein is not visualized in the upper arm.  The right side vein drains into the basilic system which is of adequate caliber  Assessment: End-stage renal disease Plan: I spoke with Dr. Posey Pronto regarding his fistulogram.  Do not have any images for review.  The plan is to continue to use his left radiocephalic fistula until the flow rates suggested is pending failure.  At that time the next step would be to proceed with a right arm access.  My feeling is that the first option  would be a right radiocephalic fistula.  At the time of the procedure, I would like to evaluate the cephalic vein likely by passing a Fogarty catheter to see if he goes into the central venous system and following adequate outflow.  If this is the case a right radiocephalic fistula would suffice.  If not, he would be a candidate for a right basilic vein transposition either done all at one procedure versus a staged procedure.  I discussed each operation in detail with the patient his family and they are willing to proceed.  Of note he is currently on Coumadin so he will need to be off of Coumadin for his procedure.  Eldridge Abrahams, [name] [facility] Office: [phone] Pager:  [phone]

## 2015-09-17 DIAGNOSIS — D631 Anemia in chronic kidney disease: Secondary | ICD-10-CM | POA: Diagnosis not present

## 2015-09-17 DIAGNOSIS — D509 Iron deficiency anemia, unspecified: Secondary | ICD-10-CM | POA: Diagnosis not present

## 2015-09-17 DIAGNOSIS — N2581 Secondary hyperparathyroidism of renal origin: Secondary | ICD-10-CM | POA: Diagnosis not present

## 2015-09-17 DIAGNOSIS — N186 End stage renal disease: Secondary | ICD-10-CM | POA: Diagnosis not present

## 2015-09-19 DIAGNOSIS — D509 Iron deficiency anemia, unspecified: Secondary | ICD-10-CM | POA: Diagnosis not present

## 2015-09-19 DIAGNOSIS — N186 End stage renal disease: Secondary | ICD-10-CM | POA: Diagnosis not present

## 2015-09-19 DIAGNOSIS — D631 Anemia in chronic kidney disease: Secondary | ICD-10-CM | POA: Diagnosis not present

## 2015-09-19 DIAGNOSIS — N2581 Secondary hyperparathyroidism of renal origin: Secondary | ICD-10-CM | POA: Diagnosis not present

## 2015-09-21 DIAGNOSIS — N186 End stage renal disease: Secondary | ICD-10-CM | POA: Diagnosis not present

## 2015-09-21 DIAGNOSIS — D631 Anemia in chronic kidney disease: Secondary | ICD-10-CM | POA: Diagnosis not present

## 2015-09-21 DIAGNOSIS — D509 Iron deficiency anemia, unspecified: Secondary | ICD-10-CM | POA: Diagnosis not present

## 2015-09-21 DIAGNOSIS — N2581 Secondary hyperparathyroidism of renal origin: Secondary | ICD-10-CM | POA: Diagnosis not present

## 2015-09-21 NOTE — Anesthesia Preprocedure Evaluation (Signed)
Anesthesia Evaluation  Patient identified by MRN, date of birth, ID band Patient awake    Reviewed: Allergy & Precautions, H&P , NPO status , Patient's Chart, lab work & pertinent test results  Airway Mallampati: II   Neck ROM: full    Dental   Pulmonary shortness of breath, sleep apnea ,    breath sounds clear to auscultation       Cardiovascular hypertension, + Peripheral Vascular Disease and + DVT   Rhythm:regular Rate:Normal     Neuro/Psych  Headaches,    GI/Hepatic GERD  ,  Endo/Other  diabetes, Type 2, Insulin Dependent  Renal/GU ESRF and DialysisRenal disease     Musculoskeletal  (+) Arthritis ,   Abdominal   Peds  Hematology Non-Hodgkin's lymphoma   Anesthesia Other Findings   Reproductive/Obstetrics                             Anesthesia Physical  Anesthesia Plan  ASA: III  Anesthesia Plan: MAC   Post-op Pain Management:    Induction: Intravenous  Airway Management Planned: Simple Face Mask  Additional Equipment:   Intra-op Plan:   Post-operative Plan:   Informed Consent: I have reviewed the patients History and Physical, chart, labs and discussed the procedure including the risks, benefits and alternatives for the proposed anesthesia with the patient or authorized representative who has indicated his/her understanding and acceptance.     Plan Discussed with: CRNA, Anesthesiologist and Surgeon  Anesthesia Plan Comments: (Dialysis on TTS, has done well with previous mac anesthesia )        Anesthesia Quick Evaluation

## 2015-09-22 ENCOUNTER — Telehealth: Payer: Self-pay | Admitting: Internal Medicine

## 2015-09-22 NOTE — Telephone Encounter (Signed)
Reviewed prep instructions with the patient He was confused - looks like prep letter was set up for MiraLax and Suprep he got a bit behind and had not done AM MiralaX - HE WILL DO THAT NOW AND SEEMS TO [place]

## 2015-09-23 ENCOUNTER — Ambulatory Visit (HOSPITAL_COMMUNITY): Payer: Medicare Other | Admitting: Anesthesiology

## 2015-09-23 ENCOUNTER — Encounter (HOSPITAL_COMMUNITY): Payer: Self-pay

## 2015-09-23 ENCOUNTER — Ambulatory Visit (HOSPITAL_COMMUNITY)
Admission: RE | Admit: 2015-09-23 | Discharge: 2015-09-23 | Disposition: A | Discharge status: Home / Self Care | Payer: Medicare Other | Source: Ambulatory Visit | Attending: Internal Medicine | Admitting: Internal Medicine

## 2015-09-23 ENCOUNTER — Encounter (HOSPITAL_COMMUNITY)
Admission: RE | Disposition: A | Discharge status: Home / Self Care | Payer: Self-pay | Source: Ambulatory Visit | Attending: Internal Medicine

## 2015-09-23 DIAGNOSIS — E1121 Type 2 diabetes mellitus with diabetic nephropathy: Secondary | ICD-10-CM | POA: Insufficient documentation

## 2015-09-23 DIAGNOSIS — D125 Benign neoplasm of sigmoid colon: Secondary | ICD-10-CM

## 2015-09-23 DIAGNOSIS — E11319 Type 2 diabetes mellitus with unspecified diabetic retinopathy without macular edema: Secondary | ICD-10-CM | POA: Diagnosis not present

## 2015-09-23 DIAGNOSIS — I120 Hypertensive chronic kidney disease with stage 5 chronic kidney disease or end stage renal disease: Secondary | ICD-10-CM | POA: Insufficient documentation

## 2015-09-23 DIAGNOSIS — Z1211 Encounter for screening for malignant neoplasm of colon: Secondary | ICD-10-CM

## 2015-09-23 DIAGNOSIS — G4730 Sleep apnea, unspecified: Secondary | ICD-10-CM | POA: Diagnosis not present

## 2015-09-23 DIAGNOSIS — Z7901 Long term (current) use of anticoagulants: Secondary | ICD-10-CM | POA: Insufficient documentation

## 2015-09-23 DIAGNOSIS — E1122 Type 2 diabetes mellitus with diabetic chronic kidney disease: Secondary | ICD-10-CM | POA: Insufficient documentation

## 2015-09-23 DIAGNOSIS — K635 Polyp of colon: Secondary | ICD-10-CM | POA: Insufficient documentation

## 2015-09-23 DIAGNOSIS — I739 Peripheral vascular disease, unspecified: Secondary | ICD-10-CM | POA: Diagnosis not present

## 2015-09-23 DIAGNOSIS — E1151 Type 2 diabetes mellitus with diabetic peripheral angiopathy without gangrene: Secondary | ICD-10-CM | POA: Insufficient documentation

## 2015-09-23 DIAGNOSIS — M1990 Unspecified osteoarthritis, unspecified site: Secondary | ICD-10-CM | POA: Insufficient documentation

## 2015-09-23 DIAGNOSIS — D123 Benign neoplasm of transverse colon: Secondary | ICD-10-CM | POA: Insufficient documentation

## 2015-09-23 DIAGNOSIS — N186 End stage renal disease: Secondary | ICD-10-CM | POA: Diagnosis not present

## 2015-09-23 DIAGNOSIS — Z992 Dependence on renal dialysis: Secondary | ICD-10-CM | POA: Diagnosis not present

## 2015-09-23 DIAGNOSIS — Z794 Long term (current) use of insulin: Secondary | ICD-10-CM | POA: Diagnosis not present

## 2015-09-23 DIAGNOSIS — Z86010 Personal history of colonic polyps: Secondary | ICD-10-CM | POA: Insufficient documentation

## 2015-09-23 DIAGNOSIS — Z86718 Personal history of other venous thrombosis and embolism: Secondary | ICD-10-CM | POA: Diagnosis not present

## 2015-09-23 DIAGNOSIS — Z8572 Personal history of non-Hodgkin lymphomas: Secondary | ICD-10-CM | POA: Insufficient documentation

## 2015-09-23 DIAGNOSIS — N63 Unspecified lump in breast: Secondary | ICD-10-CM | POA: Diagnosis not present

## 2015-09-23 HISTORY — PX: COLONOSCOPY: SHX5424

## 2015-09-23 LAB — GLUCOSE, CAPILLARY
Glucose-Capillary: 92 mg/dL (ref 65–99)
Glucose-Capillary: 92 mg/dL (ref 65–99)

## 2015-09-23 SURGERY — COLONOSCOPY
Anesthesia: Monitor Anesthesia Care

## 2015-09-23 MED ORDER — PHENYLEPHRINE 40 MCG/ML (10ML) SYRINGE FOR IV PUSH (FOR BLOOD PRESSURE SUPPORT)
PREFILLED_SYRINGE | INTRAVENOUS | Status: AC
  Filled 2015-09-23: qty 10

## 2015-09-23 MED ORDER — SODIUM CHLORIDE 0.9 % IJ SOLN
INTRAMUSCULAR | Status: AC
  Filled 2015-09-23: qty 10

## 2015-09-23 MED ORDER — PROPOFOL 10 MG/ML IV BOLUS
INTRAVENOUS | Status: DC | PRN
Start: 2015-09-23 — End: 2015-09-23
  Administered 2015-09-23: 50 mg via INTRAVENOUS
  Administered 2015-09-23: 10 mg via INTRAVENOUS

## 2015-09-23 MED ORDER — PROPOFOL 10 MG/ML IV BOLUS
INTRAVENOUS | Status: AC
  Filled 2015-09-23: qty 20

## 2015-09-23 MED ORDER — PROPOFOL 500 MG/50ML IV EMUL
INTRAVENOUS | Status: DC | PRN
  Administered 2015-09-23: 100 ug/kg/min via INTRAVENOUS

## 2015-09-23 MED ORDER — PROPOFOL 10 MG/ML IV BOLUS
INTRAVENOUS | Status: AC
  Filled 2015-09-23: qty 40

## 2015-09-23 MED ORDER — LIDOCAINE HCL (CARDIAC) 20 MG/ML IV SOLN
INTRAVENOUS | Status: DC | PRN
  Administered 2015-09-23: 50 mg via INTRAVENOUS

## 2015-09-23 MED ORDER — PHENYLEPHRINE HCL 10 MG/ML IJ SOLN
INTRAMUSCULAR | Status: DC | PRN
  Administered 2015-09-23: 40 ug via INTRAVENOUS

## 2015-09-23 MED ORDER — LIDOCAINE HCL (CARDIAC) 20 MG/ML IV SOLN
INTRAVENOUS | Status: AC
  Filled 2015-09-23: qty 5

## 2015-09-23 MED ORDER — EPHEDRINE SULFATE 50 MG/ML IJ SOLN
INTRAMUSCULAR | Status: AC
Start: 2015-09-23 — End: 2015-09-23
  Filled 2015-09-23: qty 1

## 2015-09-23 MED ORDER — SODIUM CHLORIDE 0.9 % IV SOLN
INTRAVENOUS | Status: DC
  Administered 2015-09-23: 08:00:00 via INTRAVENOUS

## 2015-09-23 NOTE — H&P (Signed)
HISTORY OF PRESENT ILLNESS:  [name] is a 65 y.o. male with MULTIPLE SIGNIFICANT medical problems who presents today regarding surveillance colonoscopy. The patient has a history of end-stage renal disease for which he is on hemodialysis Tuesday, Thursday, Saturday. He also has a history of right lower extremity DVT for which he is on chronic Coumadin therapy. He has long-standing insulin requiring diabetes mellitus. He has a history of non-Hodgkin's lymphoma for which he was treated. He has a history of adenomatous colon polyps with multiple adenomas in 2006. Follow-up colonoscopy in June 2010 revealed 2 adenomatous which were removed. His preparation was fair/adequate. Follow-up in 4 years with split prep recommended. He was aware but chose to follow through this time. His GI review of systems is unremarkable. In particular, no change in bowel habits or rectal bleeding.  REVIEW OF SYSTEMS:  All non-GI ROS negative except for right breast pain (requests evaluation)  Past Medical History  Diagnosis Date  . DVT (deep venous thrombosis) ([place])     "got one in my right leg now; I've had one before too, not sure which leg" (08/05/2013)  . Diabetic retinopathy   . Hypertension   . Hyperlipidemia   . Shortness of breath   . Peripheral vascular disease ([place])   . Blood transfusion     "years ago; blood was low" (08/05/2013)  . Noncompliance 03/16/2012  . NSVT (nonsustained ventricular tachycardia) ([place]) 03/18/2012  . Anemia   . GERD (gastroesophageal reflux disease)     uses alka seltzere on occas.   . Family history of anesthesia complication     " my son wakes up slowly"  . IDDM (insulin dependent diabetes mellitus) ([place])   . Pneumonia 2013    hosp.-   . Sleep apnea     "suppose to have a sleep study, but they never told me when. (08/05/2013)  . Headache(784.0)     "one q now and then" (08/05/2013)  . Non Hodgkin's  lymphoma ([place])     Tx 2009; "had chemo; it went away" (08/05/2013)  . 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 Diabetic nephropathy ([place])   . CKD (chronic kidney disease) stage 4, GFR 15-29 ml/min (HCC) 03/18/2012    [place]- T,TH,Sat.  . ESRD (end stage renal disease) on dialysis [facility])     "just started today, (08/04/2013)"  . Arthritis     HNP- lumbar, "all over my body"  . Poor historian     pt. unsure of several answers to health history questions     Past Surgical History  Procedure Laterality Date  . Portacath placement    . Port-a-cath removal    . Coloscopy    . Pars plana vitrectomy  08/27/2011    Procedure: PARS PLANA VITRECTOMY WITH 25 GAUGE; Surgeon: [place] Pedro, MD; Location: [place]; Service: Ophthalmology; Laterality: Left; Repair of complex traction retinal detachment left eye  . Eye surgery Bilateral   . Pars plana vitrectomy  05/10/2012    Procedure: PARS PLANA VITRECTOMY WITH 25 GAUGE; Surgeon: [place] Pedro, MD; Location: [place]; Service: Ophthalmology; Laterality: Right; Repair Complex Traction Retinal Detachment  . Membrane peel  05/10/2012    Procedure: MEMBRANE PEEL; Surgeon: [place] Pedro, MD; Location: [place]; Service: Ophthalmology; Laterality: Right;  . Photocoagulation with laser  05/10/2012    Procedure: PHOTOCOAGULATION WITH LASER; Surgeon: [place] Pedro, MD; Location: [place]; Service: Ophthalmology; Laterality: Right;  . Gas insertion  05/10/2012  Procedure: INSERTION OF GAS; Surgeon: [place] Pedro, MD; Location: [place]; Service: Ophthalmology; Laterality: Right;  . Vena cava filter placement  09/2012    due to preparation for surgery  . Av fistula placement Left 02/03/2013    Procedure: ARTERIOVENOUS (AV) FISTULA CREATION- LEFT RADIAL CEPHALIC; ULTRASOUND GUIDED; Surgeon: Mal Misty, MD;  Location: [place]; Service: Vascular; Laterality: Left;  . Cardiac catheterization    . Lesion excision Right 05/08/2015    Procedure: EXCISION SCALP LESION; Surgeon: Erroll Luna, MD; Location: [place]; Service: General; Laterality: Right;    Social History Donata Clay  reports that he has never smoked. He has never used smokeless tobacco. He reports that he does not drink alcohol or use illicit drugs.  family history includes Anesthesia problems in his son; Diabetes in his father; Hypertension in his father and son; Other in his father.  No Known Allergies    PHYSICAL EXAMINATION: Vital signs: BP 100/64 mmHg  Pulse 72  Ht 6' 3.5" (1.918 m)  Wt 274 lb 4 oz (124.399 kg)  BMI 33.82 kg/m2  Constitutional: Pleasant, chronically ill-appearing , no acute distress Psychiatric: alert and oriented x3, cooperative Eyes: extraocular movements intact, anicteric, conjunctiva pink Mouth: oral pharynx moist, no lesions Neck: supple without thyromegaly Lymph no obvious lymphadenopathy Breasts: Firm tender right breast mass Cardiovascular: heart regular rate and rhythm, no murmur Lungs: clear to auscultation bilaterally Abdomen: soft, nontender, nondistended, no obvious ascites, no peritoneal signs, normal bowel sounds, no organomegaly Rectal: Deferred until colonoscopy Extremities: Left upper extremity AV graft. no clubbing cyanosis or significant lower extremity edema bilaterally Skin: no lesions on visible extremities Neuro: No focal deficits. Cranial nerves intact  ASSESSMENT:  #1. Firm tender right breast mass. Rule out cancer #2. History of adenomatous colon polyps. Overdue for surveillance #3. High-risk patient with multiple significant medical problems and issues including dialysis, insulin requiring diabetes, and chronic anticoagulation   PLAN:  #1. Mammogram ASAP #2. Surgical referral thereafter #3. Timing of colonoscopy to be determined. We will need to  adjust his insulin, hold anticoagulation with approval from his PCP, and plan Monday appointment to work around dialysis schedule.The nature of the procedure, as well as the risks, benefits, and alternatives were carefully and thoroughly reviewed with the patient. Ample time for discussion and questions allowed. The patient understood, was satisfied, and agreed to proceed.  As above. Mammography negative. For colonoscopy Docia Chuck. Geri Seminole., [name] [facility] Division of Gastroenterology

## 2015-09-23 NOTE — Op Note (Signed)
[facility] Patient Name: [name] Procedure Date: 09/23/2015 MRN: [id number] Attending MD: Docia Chuck. [name] , MD Date of Birth: 1950/10/25 CSN:  Age: 65 Admit Type: Outpatient Procedure:                Colonoscopyon , with snare polypectomy -5 Indications:              High risk colon cancer surveillance: Personal                            history of multiple (3 or more) adenomas. Previous                            examination 2006 with multiple adenomas. Follow-up                            2010 with 2 adenomas and fair prep. Overdue for                            surveillance Providers:                Docia Chuck. [name], MD, Laverta Baltimore, RN, Corliss Parish, Technician Referring MD:              Medicines:                Monitored Anesthesia Care Complications:            No immediate complications. Estimated blood loss:                            None. Estimated Blood Loss:     Estimated blood loss was minimal. Procedure:                Pre-Anesthesia Assessment:                           - Prior to the procedure, a History and Physical                            was performed, and patient medications and                            allergies were reviewed. The patient's tolerance of                            previous anesthesia was also reviewed. The risks                            and benefits of the procedure and the sedation                            options and risks were discussed with the patient.  All questions were answered, and informed consent                            was obtained. Prior Anticoagulants: The patient has                            taken Coumadin (warfarin), last dose was 7 days                            prior to procedure. ASA Grade Assessment: III - A                            patient with severe systemic disease. After                            reviewing the risks and  benefits, the patient was                            deemed in satisfactory condition to undergo the                            procedure.                           After obtaining informed consent, the colonoscope                            was passed under direct vision. Throughout the                            procedure, the patient's blood pressure, pulse, and                            oxygen saturations were monitored continuously. The                            [id number]) scope was introduced through                            the anus and advanced to the the cecum, identified                            by appendiceal orifice and ileocecal valve. The                            ileocecal valve, appendiceal orifice, and rectum                            were photographed. The quality of the bowel                            preparation was excellent. The colonoscopy was  performed without difficulty. The patient tolerated                            the procedure well. The bowel preparation used was                            SUPREP. Findings:      Five pedunculated polyps were found in the sigmoid colon and transverse       colon(4). The polyps were 4 to 9 mm in size. These polyps were removed       with a cold snare. Resection and retrieval were complete The largest       olyp located in the transverse colon demonstrated moderatepersistent       venous oozing which was treated with snare tip cautery.      The exam was otherwise without abnormality on direct and retroflexion       views. Impression:               - Five 4 to 9 mm polyps in the sigmoid colon and in                            the transverse colon, removed with a cold snare.                            Resected and retrieved.                           - The examination was otherwise normal on direct                            and retroflexion views. Moderate Sedation:      N/A- Per  Anesthesia Care Recommendation:           - Repeat colonoscopy in 3 years for surveillance.                           - Resume Coumadin (warfarin) tomorrow at prior dose.                           - Patient has a contact number available for                            emergencies. The signs and symptoms of potential                            delayed complications were discussed with the                            patient. Return to normal activities tomorrow.                            Written discharge instructions were provided to the                            patient.                           -  Resume previous diet.                           - Continue present medications.                           - Await pathology results. Procedure Code(s):        --- Professional ---                           [id number], Colonoscopy, flexible; with removal of                            tumor(s), polyp(s), or other lesion(s) by snare                            technique Diagnosis Code(s):        --- Professional ---                           Z86.010, Personal history of colonic polyps                           D12.5, Benign neoplasm of sigmoid colon                           D12.3, Benign neoplasm of transverse colon (hepatic                            flexure or splenic flexure) CPT copyright 2016 American [employer] Association. All rights reserved. The codes documented in this report are preliminary and upon coder review may  be revised to meet current compliance requirements. Docia Chuck. [name], MD 09/23/2015 9:31:29 AM This report has been signed electronically. Number of Addenda: 0

## 2015-09-23 NOTE — Discharge Instructions (Signed)
Colonoscopy, Care After °Refer to this sheet in the next few weeks. These instructions provide you with information on caring for yourself after your procedure. Your health care provider may also give you more specific instructions. Your treatment has been planned according to current medical practices, but problems sometimes occur. Call your health care provider if you have any problems or questions after your procedure. °WHAT TO EXPECT AFTER THE PROCEDURE  °After your procedure, it is typical to have the following: °· A small amount of blood in your stool. °· Moderate amounts of gas and mild abdominal cramping or bloating. °HOME CARE INSTRUCTIONS °· Do not drive, operate machinery, or sign important documents for 24 hours. °· You may shower and resume your regular physical activities, but move at a slower pace for the first 24 hours. °· Take frequent rest periods for the first 24 hours. °· Walk around or put a warm pack on your abdomen to help reduce abdominal cramping and bloating. °· Drink enough fluids to keep your urine clear or pale yellow. °· You may resume your normal diet as instructed by your health care provider. Avoid heavy or fried foods that are hard to digest. °· Avoid drinking alcohol for 24 hours or as instructed by your health care provider. °· Only take over-the-counter or prescription medicines as directed by your health care provider. °· If a tissue sample (biopsy) was taken during your procedure: °¨ Do not take aspirin or blood thinners for 7 days, or as instructed by your health care provider. °¨ Do not drink alcohol for 7 days, or as instructed by your health care provider. °¨ Eat soft foods for the first 24 hours. °SEEK MEDICAL CARE IF: °You have persistent spotting of blood in your stool 2-3 days after the procedure. °SEEK IMMEDIATE MEDICAL CARE IF: °· You have more than a small spotting of blood in your stool. °· You pass large blood clots in your stool. °· Your abdomen is swollen  (distended). °· You have nausea or vomiting. °· You have a fever. °· You have increasing abdominal pain that is not relieved with medicine. °  °This information is not intended to replace advice given to you by your health care provider. Make sure you discuss any questions you have with your health care provider. °  °Document Released: 12/31/2003 Document Revised: 03/08/2013 Document Reviewed: 01/23/2013 °Elsevier Interactive Patient Education ©2016 Elsevier Inc. ° °

## 2015-09-23 NOTE — Anesthesia Postprocedure Evaluation (Signed)
Anesthesia Post Note  Patient: [name]  Procedure(s) Performed: Procedure(s) (LRB): COLONOSCOPY (N/A)  Patient location during evaluation: PACU Anesthesia Type: MAC Level of consciousness: awake and alert Pain management: pain level controlled Vital Signs Assessment: post-procedure vital signs reviewed and stable Respiratory status: spontaneous breathing, nonlabored ventilation, respiratory function stable and patient connected to nasal cannula oxygen Cardiovascular status: stable and blood pressure returned to baseline Anesthetic complications: no    Last Vitals:  Filed Vitals:   09/23/15 0950 09/23/15 1000  BP: 115/73 101/63  Pulse: 89 87  Temp:    Resp: 14 16    Last Pain: There were no vitals filed for this visit.               Zenaida Deed

## 2015-09-23 NOTE — Transfer of Care (Signed)
Immediate Anesthesia Transfer of Care Note  Patient: [name]  Procedure(s) Performed: Procedure(s): COLONOSCOPY (N/A)  Patient Location: PACU  Anesthesia Type:MAC  Level of Consciousness: Patient easily awoken, sedated, comfortable, cooperative, following commands, responds to stimulation.   Airway & Oxygen Therapy: Patient spontaneously breathing, ventilating well, oxygen via simple oxygen mask.  Post-op Assessment: Report given to PACU RN, vital signs reviewed and stable, moving all extremities.   Post vital signs: Reviewed and stable.  Complications: No apparent anesthesia complications

## 2015-09-24 ENCOUNTER — Encounter (HOSPITAL_COMMUNITY): Payer: Self-pay | Admitting: Internal Medicine

## 2015-09-24 ENCOUNTER — Encounter: Payer: Self-pay | Admitting: Internal Medicine

## 2015-09-24 DIAGNOSIS — N2581 Secondary hyperparathyroidism of renal origin: Secondary | ICD-10-CM | POA: Diagnosis not present

## 2015-09-24 DIAGNOSIS — N186 End stage renal disease: Secondary | ICD-10-CM | POA: Diagnosis not present

## 2015-09-24 DIAGNOSIS — D509 Iron deficiency anemia, unspecified: Secondary | ICD-10-CM | POA: Diagnosis not present

## 2015-09-24 DIAGNOSIS — D631 Anemia in chronic kidney disease: Secondary | ICD-10-CM | POA: Diagnosis not present

## 2015-09-26 DIAGNOSIS — D631 Anemia in chronic kidney disease: Secondary | ICD-10-CM | POA: Diagnosis not present

## 2015-09-26 DIAGNOSIS — N186 End stage renal disease: Secondary | ICD-10-CM | POA: Diagnosis not present

## 2015-09-26 DIAGNOSIS — N2581 Secondary hyperparathyroidism of renal origin: Secondary | ICD-10-CM | POA: Diagnosis not present

## 2015-09-26 DIAGNOSIS — D509 Iron deficiency anemia, unspecified: Secondary | ICD-10-CM | POA: Diagnosis not present

## 2015-09-28 DIAGNOSIS — N186 End stage renal disease: Secondary | ICD-10-CM | POA: Diagnosis not present

## 2015-09-28 DIAGNOSIS — D631 Anemia in chronic kidney disease: Secondary | ICD-10-CM | POA: Diagnosis not present

## 2015-09-28 DIAGNOSIS — D509 Iron deficiency anemia, unspecified: Secondary | ICD-10-CM | POA: Diagnosis not present

## 2015-09-28 DIAGNOSIS — N2581 Secondary hyperparathyroidism of renal origin: Secondary | ICD-10-CM | POA: Diagnosis not present

## 2015-09-29 DIAGNOSIS — Z992 Dependence on renal dialysis: Secondary | ICD-10-CM | POA: Diagnosis not present

## 2015-09-29 DIAGNOSIS — N186 End stage renal disease: Secondary | ICD-10-CM | POA: Diagnosis not present

## 2015-09-29 DIAGNOSIS — E1129 Type 2 diabetes mellitus with other diabetic kidney complication: Secondary | ICD-10-CM | POA: Diagnosis not present

## 2015-10-01 DIAGNOSIS — D509 Iron deficiency anemia, unspecified: Secondary | ICD-10-CM | POA: Diagnosis not present

## 2015-10-01 DIAGNOSIS — N186 End stage renal disease: Secondary | ICD-10-CM | POA: Diagnosis not present

## 2015-10-01 DIAGNOSIS — E1129 Type 2 diabetes mellitus with other diabetic kidney complication: Secondary | ICD-10-CM | POA: Diagnosis not present

## 2015-10-01 DIAGNOSIS — D631 Anemia in chronic kidney disease: Secondary | ICD-10-CM | POA: Diagnosis not present

## 2015-10-01 DIAGNOSIS — N2581 Secondary hyperparathyroidism of renal origin: Secondary | ICD-10-CM | POA: Diagnosis not present

## 2015-10-02 DIAGNOSIS — I4891 Unspecified atrial fibrillation: Secondary | ICD-10-CM | POA: Diagnosis not present

## 2015-10-02 DIAGNOSIS — I80202 Phlebitis and thrombophlebitis of unspecified deep vessels of left lower extremity: Secondary | ICD-10-CM | POA: Diagnosis not present

## 2015-10-02 DIAGNOSIS — Z7901 Long term (current) use of anticoagulants: Secondary | ICD-10-CM | POA: Diagnosis not present

## 2015-10-03 DIAGNOSIS — N186 End stage renal disease: Secondary | ICD-10-CM | POA: Diagnosis not present

## 2015-10-03 DIAGNOSIS — D631 Anemia in chronic kidney disease: Secondary | ICD-10-CM | POA: Diagnosis not present

## 2015-10-03 DIAGNOSIS — E1129 Type 2 diabetes mellitus with other diabetic kidney complication: Secondary | ICD-10-CM | POA: Diagnosis not present

## 2015-10-03 DIAGNOSIS — D509 Iron deficiency anemia, unspecified: Secondary | ICD-10-CM | POA: Diagnosis not present

## 2015-10-03 DIAGNOSIS — N2581 Secondary hyperparathyroidism of renal origin: Secondary | ICD-10-CM | POA: Diagnosis not present

## 2015-10-05 DIAGNOSIS — E1129 Type 2 diabetes mellitus with other diabetic kidney complication: Secondary | ICD-10-CM | POA: Diagnosis not present

## 2015-10-05 DIAGNOSIS — D509 Iron deficiency anemia, unspecified: Secondary | ICD-10-CM | POA: Diagnosis not present

## 2015-10-05 DIAGNOSIS — N2581 Secondary hyperparathyroidism of renal origin: Secondary | ICD-10-CM | POA: Diagnosis not present

## 2015-10-05 DIAGNOSIS — D631 Anemia in chronic kidney disease: Secondary | ICD-10-CM | POA: Diagnosis not present

## 2015-10-05 DIAGNOSIS — N186 End stage renal disease: Secondary | ICD-10-CM | POA: Diagnosis not present

## 2015-10-07 DIAGNOSIS — Z6834 Body mass index (BMI) 34.0-34.9, adult: Secondary | ICD-10-CM | POA: Diagnosis not present

## 2015-10-07 DIAGNOSIS — K432 Incisional hernia without obstruction or gangrene: Secondary | ICD-10-CM | POA: Diagnosis not present

## 2015-10-08 DIAGNOSIS — D631 Anemia in chronic kidney disease: Secondary | ICD-10-CM | POA: Diagnosis not present

## 2015-10-08 DIAGNOSIS — N2581 Secondary hyperparathyroidism of renal origin: Secondary | ICD-10-CM | POA: Diagnosis not present

## 2015-10-08 DIAGNOSIS — E1129 Type 2 diabetes mellitus with other diabetic kidney complication: Secondary | ICD-10-CM | POA: Diagnosis not present

## 2015-10-08 DIAGNOSIS — D509 Iron deficiency anemia, unspecified: Secondary | ICD-10-CM | POA: Diagnosis not present

## 2015-10-08 DIAGNOSIS — N186 End stage renal disease: Secondary | ICD-10-CM | POA: Diagnosis not present

## 2015-10-10 DIAGNOSIS — D631 Anemia in chronic kidney disease: Secondary | ICD-10-CM | POA: Diagnosis not present

## 2015-10-10 DIAGNOSIS — D509 Iron deficiency anemia, unspecified: Secondary | ICD-10-CM | POA: Diagnosis not present

## 2015-10-10 DIAGNOSIS — E1129 Type 2 diabetes mellitus with other diabetic kidney complication: Secondary | ICD-10-CM | POA: Diagnosis not present

## 2015-10-10 DIAGNOSIS — N186 End stage renal disease: Secondary | ICD-10-CM | POA: Diagnosis not present

## 2015-10-10 DIAGNOSIS — N2581 Secondary hyperparathyroidism of renal origin: Secondary | ICD-10-CM | POA: Diagnosis not present

## 2015-10-12 DIAGNOSIS — D509 Iron deficiency anemia, unspecified: Secondary | ICD-10-CM | POA: Diagnosis not present

## 2015-10-12 DIAGNOSIS — E1129 Type 2 diabetes mellitus with other diabetic kidney complication: Secondary | ICD-10-CM | POA: Diagnosis not present

## 2015-10-12 DIAGNOSIS — D631 Anemia in chronic kidney disease: Secondary | ICD-10-CM | POA: Diagnosis not present

## 2015-10-12 DIAGNOSIS — N186 End stage renal disease: Secondary | ICD-10-CM | POA: Diagnosis not present

## 2015-10-12 DIAGNOSIS — N2581 Secondary hyperparathyroidism of renal origin: Secondary | ICD-10-CM | POA: Diagnosis not present

## 2015-10-14 DIAGNOSIS — C799 Secondary malignant neoplasm of unspecified site: Secondary | ICD-10-CM | POA: Diagnosis not present

## 2015-10-15 DIAGNOSIS — N2581 Secondary hyperparathyroidism of renal origin: Secondary | ICD-10-CM | POA: Diagnosis not present

## 2015-10-15 DIAGNOSIS — D509 Iron deficiency anemia, unspecified: Secondary | ICD-10-CM | POA: Diagnosis not present

## 2015-10-15 DIAGNOSIS — D631 Anemia in chronic kidney disease: Secondary | ICD-10-CM | POA: Diagnosis not present

## 2015-10-15 DIAGNOSIS — N186 End stage renal disease: Secondary | ICD-10-CM | POA: Diagnosis not present

## 2015-10-15 DIAGNOSIS — E1129 Type 2 diabetes mellitus with other diabetic kidney complication: Secondary | ICD-10-CM | POA: Diagnosis not present

## 2015-10-16 DIAGNOSIS — I4891 Unspecified atrial fibrillation: Secondary | ICD-10-CM | POA: Diagnosis not present

## 2015-10-16 DIAGNOSIS — I80202 Phlebitis and thrombophlebitis of unspecified deep vessels of left lower extremity: Secondary | ICD-10-CM | POA: Diagnosis not present

## 2015-10-16 DIAGNOSIS — Z7901 Long term (current) use of anticoagulants: Secondary | ICD-10-CM | POA: Diagnosis not present

## 2015-10-17 ENCOUNTER — Other Ambulatory Visit: Payer: Self-pay | Admitting: Oncology

## 2015-10-17 ENCOUNTER — Ambulatory Visit: Payer: Medicare Other | Admitting: Oncology

## 2015-10-17 ENCOUNTER — Telehealth: Payer: Self-pay | Admitting: Oncology

## 2015-10-17 DIAGNOSIS — N2581 Secondary hyperparathyroidism of renal origin: Secondary | ICD-10-CM | POA: Diagnosis not present

## 2015-10-17 DIAGNOSIS — E1129 Type 2 diabetes mellitus with other diabetic kidney complication: Secondary | ICD-10-CM | POA: Diagnosis not present

## 2015-10-17 DIAGNOSIS — D509 Iron deficiency anemia, unspecified: Secondary | ICD-10-CM | POA: Diagnosis not present

## 2015-10-17 DIAGNOSIS — N186 End stage renal disease: Secondary | ICD-10-CM | POA: Diagnosis not present

## 2015-10-17 DIAGNOSIS — D631 Anemia in chronic kidney disease: Secondary | ICD-10-CM | POA: Diagnosis not present

## 2015-10-17 NOTE — Telephone Encounter (Signed)
s.w. pt son and advised on 5.19 appt.Marland KitchenMarland KitchenMarland KitchenMarland Kitchenpt could not come today he has dialysis.

## 2015-10-18 ENCOUNTER — Telehealth: Payer: Self-pay | Admitting: Oncology

## 2015-10-18 ENCOUNTER — Ambulatory Visit (HOSPITAL_BASED_OUTPATIENT_CLINIC_OR_DEPARTMENT_OTHER): Payer: Medicare Other | Admitting: Oncology

## 2015-10-18 VITALS — BP 104/68 | HR 98 | Temp 97.5°F | Resp 18 | Ht 75.5 in | Wt 270.4 lb

## 2015-10-18 DIAGNOSIS — C792 Secondary malignant neoplasm of skin: Secondary | ICD-10-CM

## 2015-10-18 DIAGNOSIS — E119 Type 2 diabetes mellitus without complications: Secondary | ICD-10-CM

## 2015-10-18 DIAGNOSIS — N19 Unspecified kidney failure: Secondary | ICD-10-CM

## 2015-10-18 DIAGNOSIS — IMO0002 Reserved for concepts with insufficient information to code with codable children: Secondary | ICD-10-CM

## 2015-10-18 DIAGNOSIS — Z992 Dependence on renal dialysis: Secondary | ICD-10-CM | POA: Diagnosis not present

## 2015-10-18 DIAGNOSIS — C801 Malignant (primary) neoplasm, unspecified: Secondary | ICD-10-CM

## 2015-10-18 DIAGNOSIS — Z8572 Personal history of non-Hodgkin lymphomas: Secondary | ICD-10-CM

## 2015-10-18 NOTE — Telephone Encounter (Signed)
Gave pt apt & avs °

## 2015-10-18 NOTE — Progress Notes (Signed)
Hematology and Oncology Follow Up Visit  [name] [id number] 10/13/1950 65 y.o. 10/18/2015 10:49 AM [name],John A, MDAronson, Richard, MD   Principle Diagnosis: 89 year old gentleman with the following issues:  1. 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 He is status post CHOP with rituximab chemotherapy with complete response in 2010. He continues to be in remission. 2. 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   Prior Therapy: He is status post local excision of 3 scalp masses in the right frontal parietal region done on 05/08/2015. The pathology showed poorly differentiated sarcomatoid carcinoma. PET CT scan in September 2016 did not show any evidence of metastatic disease.    Current therapy: Observation and surveillance.  Interim History: [name] presents today for a follow-up visit. He is a pleasant gentleman with a rather complex medical history including long-standing diabetes and renal failure currently hemodialysis-dependent. Since the last visit, he underwent surgical resection of his scalp lesion by Dr. Brantley Stage enough tolerated it well. Patient to follow up since that time. Clinically been doing fairly well but he reports smaller lesions around the excision site. No other lymphadenopathy or symptoms noted. He continues to receive dialysis without any decline and attends to activities of daily living. His overall quality of life is not great but have not changed dramatically.  He does not report any headaches, blurry vision or double vision or neurological deficits. He does report some discomfort around his biopsy site. He does not report any fevers, chills sweats or other constitutional symptoms. He does not report any chest pain, palpitation orthopnea. Does not report any cough or hemoptysis. Does not report any nausea, vomiting or abdominal pain. He does not report any hematochezia or melanoma. Remaining review of systems  unremarkable.   Medications: I have reviewed the patient's current medications.  Current Outpatient Prescriptions  Medication Sig Dispense Refill  . acetaminophen (TYLENOL) 325 MG tablet Take 650 mg by mouth every 6 (six) hours as needed (pain).    [name] 120 MG 24 hr capsule Take 120 mg by mouth daily.    Marland Kitchen HYDROcodone-acetaminophen (NORCO) 5-325 MG tablet Take 1 tablet by mouth every 6 (six) hours as needed for moderate pain. 30 tablet 0  . insulin glargine (LANTUS) 100 UNIT/ML injection Inject 0.3 mLs (30 Units total) into the skin at bedtime. 10 mL 11  . insulin lispro (HUMALOG) 100 UNIT/ML injection Inject 10 Units into the skin 3 (three) times daily with meals.     . lidocaine-prilocaine (EMLA) cream APPLY SMALL AMOUNT TO ACCESS SITE 1 TO 2 HOURS BEFORE DIALYSIS. COVER WITH OCCLUSIVE DRESSING (SARAN WRAP).  6  . multivitamin (RENA-VIT) TABS tablet Take 1 tablet by mouth at bedtime. 30 tablet 0  . polysaccharide iron (NIFEREX) 150 MG CAPS capsule Take 1 capsule (150 mg total) by mouth daily. 30 each 2  . sevelamer carbonate (RENVELA) 800 MG tablet Take 1,600 mg by mouth 3 (three) times daily with meals.    Manus Gunning BOWEL PREP SOLN Take 1 kit by mouth once. 354 mL 0  . warfarin (COUMADIN) 5 MG tablet Take 7.5 mg by mouth every evening.      No current facility-administered medications for this visit.     Allergies: No Known Allergies  Past Medical History, Surgical history, Social history, and Family History were reviewed and updated.   Physical Exam: Blood pressure 104/68, pulse 98, temperature 97.5 F (36.4 C), temperature source Oral, resp. rate 18, height 6'  3.5" (1.918 m), weight 270 lb 6.4 oz (122.653 kg), SpO2 100 %. ECOG: 1 General appearance: alert and cooperative chronically ill-appearing without distress. Head: Incision sites appear well healed. Small bumps noted close to the incision sites. Neck: no adenopathy Lymph nodes: Cervical, supraclavicular, and axillary  nodes normal. Heart:regular rate and rhythm, S1, S2 normal, no murmur, click, rub or gallop Lung:chest clear, no wheezing, rales, normal symmetric air entry Abdomin: soft, non-tender, without masses or organomegaly EXT:no erythema, induration, or nodules   Lab Results: Lab Results  Component Value Date   WBC 2.6* 05/03/2015   HGB 11.4* 05/03/2015   HCT 34.9* 05/03/2015   MCV 82.5 05/03/2015   PLT 99* 05/03/2015     Chemistry      Component Value Date/Time   NA 131* 05/03/2015 1101   K 3.8 05/03/2015 1101   CL 91* 05/03/2015 1101   CO2 29 05/03/2015 1101   BUN 22* 05/03/2015 1101   CREATININE 4.94* 05/03/2015 1101   CREATININE 4.44* 01/14/2011 1428      Component Value Date/Time   CALCIUM 8.9 05/03/2015 1101   CALCIUM 9.1 01/09/2013 0903   ALKPHOS 233* 05/03/2015 1101   AST 39 05/03/2015 1101   ALT 26 05/03/2015 1101   BILITOT 1.6* 05/03/2015 1101         Impression and Plan:  65 year old gentleman with the following issues:  1. 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 His PET scan back in September 2016 did not show any evidence of metastatic disease or primary source of this tumor. He is status post surgical resection and currently has possible recurrent disease.   The plan is to repeat imaging studies including imaging study of the brain as well as the chest to rule out any local primary site or metastasis. If we are dealing with local recurrence, radiation therapy might be an option rather than repeat excision. I'll send him for evaluation from radiation oncology.  2. History of lymphoma: He continues to be a remission at this time close to 7 years out.  3. Follow-up: The near future depending on the results of his evaluation.   [facility], MD 5/19/201710:49 AM

## 2015-10-19 DIAGNOSIS — N186 End stage renal disease: Secondary | ICD-10-CM | POA: Diagnosis not present

## 2015-10-19 DIAGNOSIS — N2581 Secondary hyperparathyroidism of renal origin: Secondary | ICD-10-CM | POA: Diagnosis not present

## 2015-10-19 DIAGNOSIS — D631 Anemia in chronic kidney disease: Secondary | ICD-10-CM | POA: Diagnosis not present

## 2015-10-19 DIAGNOSIS — D509 Iron deficiency anemia, unspecified: Secondary | ICD-10-CM | POA: Diagnosis not present

## 2015-10-19 DIAGNOSIS — E1129 Type 2 diabetes mellitus with other diabetic kidney complication: Secondary | ICD-10-CM | POA: Diagnosis not present

## 2015-10-22 DIAGNOSIS — D509 Iron deficiency anemia, unspecified: Secondary | ICD-10-CM | POA: Diagnosis not present

## 2015-10-22 DIAGNOSIS — N2581 Secondary hyperparathyroidism of renal origin: Secondary | ICD-10-CM | POA: Diagnosis not present

## 2015-10-22 DIAGNOSIS — D631 Anemia in chronic kidney disease: Secondary | ICD-10-CM | POA: Diagnosis not present

## 2015-10-22 DIAGNOSIS — N186 End stage renal disease: Secondary | ICD-10-CM | POA: Diagnosis not present

## 2015-10-22 DIAGNOSIS — E1129 Type 2 diabetes mellitus with other diabetic kidney complication: Secondary | ICD-10-CM | POA: Diagnosis not present

## 2015-10-24 ENCOUNTER — Telehealth: Payer: Self-pay

## 2015-10-24 DIAGNOSIS — N186 End stage renal disease: Secondary | ICD-10-CM | POA: Diagnosis not present

## 2015-10-24 DIAGNOSIS — N2581 Secondary hyperparathyroidism of renal origin: Secondary | ICD-10-CM | POA: Diagnosis not present

## 2015-10-24 DIAGNOSIS — D509 Iron deficiency anemia, unspecified: Secondary | ICD-10-CM | POA: Diagnosis not present

## 2015-10-24 DIAGNOSIS — E1129 Type 2 diabetes mellitus with other diabetic kidney complication: Secondary | ICD-10-CM | POA: Diagnosis not present

## 2015-10-24 DIAGNOSIS — D631 Anemia in chronic kidney disease: Secondary | ICD-10-CM | POA: Diagnosis not present

## 2015-10-24 NOTE — Telephone Encounter (Signed)
rec'd call from [facility].  Reported the pt. Has decreased access flow through left arm AVF; reported flow rate at 386.  Questioned about plan for placement of new permanent access.  Reviewed last office visit on 09/16/15; Dr. Trula Slade recommended a right RC AVF vs right BC AVF.   Will discuss with Dr. Trula Slade for plan.  Pt. Is on Coumadin.  Cheron Every @ [facility], that will notify her of plan per Dr. Stephens Shire recommendation.  Verb. Understanding.

## 2015-10-25 ENCOUNTER — Other Ambulatory Visit: Payer: Self-pay

## 2015-10-25 NOTE — Telephone Encounter (Addendum)
Rec'd verbal order from Dr. Trula Slade to schedule for right arm AVF and to hold Coumadin, pre-op.  Will notify pt.   (Note that the previous entry at 11:18 was an erroneous documentation.)

## 2015-10-26 DIAGNOSIS — E1129 Type 2 diabetes mellitus with other diabetic kidney complication: Secondary | ICD-10-CM | POA: Diagnosis not present

## 2015-10-26 DIAGNOSIS — N186 End stage renal disease: Secondary | ICD-10-CM | POA: Diagnosis not present

## 2015-10-26 DIAGNOSIS — D631 Anemia in chronic kidney disease: Secondary | ICD-10-CM | POA: Diagnosis not present

## 2015-10-26 DIAGNOSIS — N2581 Secondary hyperparathyroidism of renal origin: Secondary | ICD-10-CM | POA: Diagnosis not present

## 2015-10-26 DIAGNOSIS — D509 Iron deficiency anemia, unspecified: Secondary | ICD-10-CM | POA: Diagnosis not present

## 2015-10-29 ENCOUNTER — Ambulatory Visit: Payer: Medicare Other | Admitting: Radiation Oncology

## 2015-10-29 ENCOUNTER — Ambulatory Visit: Payer: Medicare Other

## 2015-10-29 DIAGNOSIS — D631 Anemia in chronic kidney disease: Secondary | ICD-10-CM | POA: Diagnosis not present

## 2015-10-29 DIAGNOSIS — D509 Iron deficiency anemia, unspecified: Secondary | ICD-10-CM | POA: Diagnosis not present

## 2015-10-29 DIAGNOSIS — N2581 Secondary hyperparathyroidism of renal origin: Secondary | ICD-10-CM | POA: Diagnosis not present

## 2015-10-29 DIAGNOSIS — N186 End stage renal disease: Secondary | ICD-10-CM | POA: Diagnosis not present

## 2015-10-29 DIAGNOSIS — E1129 Type 2 diabetes mellitus with other diabetic kidney complication: Secondary | ICD-10-CM | POA: Diagnosis not present

## 2015-10-30 DIAGNOSIS — Z992 Dependence on renal dialysis: Secondary | ICD-10-CM | POA: Diagnosis not present

## 2015-10-30 DIAGNOSIS — E1129 Type 2 diabetes mellitus with other diabetic kidney complication: Secondary | ICD-10-CM | POA: Diagnosis not present

## 2015-10-30 DIAGNOSIS — N186 End stage renal disease: Secondary | ICD-10-CM | POA: Diagnosis not present

## 2015-10-30 NOTE — Progress Notes (Signed)
Notified Dr. Jacquiline Doe office of need to have pt. hold Coumadin x 5 days, prior to surgery for Creation of (R) arm AVF.  rec'd phone call back from [place], with Dr. Reynaldo Minium.  Stated that Dr. Reynaldo Minium had approved Coumadin be held 5 days pre-op, and that pt. Will not require Lovenox bridge.  Pt. Made aware of holding Coumadin 5 days prior to surgery scheduled on 11/08/15.

## 2015-10-31 DIAGNOSIS — E8779 Other fluid overload: Secondary | ICD-10-CM | POA: Diagnosis not present

## 2015-10-31 DIAGNOSIS — N186 End stage renal disease: Secondary | ICD-10-CM | POA: Diagnosis not present

## 2015-10-31 DIAGNOSIS — D631 Anemia in chronic kidney disease: Secondary | ICD-10-CM | POA: Diagnosis not present

## 2015-10-31 DIAGNOSIS — E1129 Type 2 diabetes mellitus with other diabetic kidney complication: Secondary | ICD-10-CM | POA: Diagnosis not present

## 2015-10-31 DIAGNOSIS — D509 Iron deficiency anemia, unspecified: Secondary | ICD-10-CM | POA: Diagnosis not present

## 2015-10-31 DIAGNOSIS — N2581 Secondary hyperparathyroidism of renal origin: Secondary | ICD-10-CM | POA: Diagnosis not present

## 2015-11-02 DIAGNOSIS — E8779 Other fluid overload: Secondary | ICD-10-CM | POA: Diagnosis not present

## 2015-11-02 DIAGNOSIS — D509 Iron deficiency anemia, unspecified: Secondary | ICD-10-CM | POA: Diagnosis not present

## 2015-11-02 DIAGNOSIS — N2581 Secondary hyperparathyroidism of renal origin: Secondary | ICD-10-CM | POA: Diagnosis not present

## 2015-11-02 DIAGNOSIS — D631 Anemia in chronic kidney disease: Secondary | ICD-10-CM | POA: Diagnosis not present

## 2015-11-02 DIAGNOSIS — N186 End stage renal disease: Secondary | ICD-10-CM | POA: Diagnosis not present

## 2015-11-02 DIAGNOSIS — E1129 Type 2 diabetes mellitus with other diabetic kidney complication: Secondary | ICD-10-CM | POA: Diagnosis not present

## 2015-11-04 ENCOUNTER — Encounter: Payer: Self-pay | Admitting: *Deleted

## 2015-11-04 ENCOUNTER — Ambulatory Visit
Admission: RE | Admit: 2015-11-04 | Discharge: 2015-11-04 | Disposition: A | Discharge status: Home / Self Care | Payer: Medicare Other | Source: Ambulatory Visit | Attending: Radiation Oncology | Admitting: Radiation Oncology

## 2015-11-04 ENCOUNTER — Encounter: Payer: Self-pay | Admitting: Radiation Oncology

## 2015-11-04 VITALS — BP 112/74 | HR 97 | Resp 16 | Ht 75.5 in | Wt 273.7 lb

## 2015-11-04 DIAGNOSIS — E11319 Type 2 diabetes mellitus with unspecified diabetic retinopathy without macular edema: Secondary | ICD-10-CM | POA: Insufficient documentation

## 2015-11-04 DIAGNOSIS — Z7901 Long term (current) use of anticoagulants: Secondary | ICD-10-CM | POA: Diagnosis not present

## 2015-11-04 DIAGNOSIS — Z794 Long term (current) use of insulin: Secondary | ICD-10-CM | POA: Diagnosis not present

## 2015-11-04 DIAGNOSIS — Z992 Dependence on renal dialysis: Secondary | ICD-10-CM | POA: Diagnosis not present

## 2015-11-04 DIAGNOSIS — N186 End stage renal disease: Secondary | ICD-10-CM | POA: Diagnosis not present

## 2015-11-04 DIAGNOSIS — E1122 Type 2 diabetes mellitus with diabetic chronic kidney disease: Secondary | ICD-10-CM | POA: Diagnosis not present

## 2015-11-04 DIAGNOSIS — C490 Malignant neoplasm of connective and soft tissue of head, face and neck: Secondary | ICD-10-CM

## 2015-11-04 DIAGNOSIS — Z86718 Personal history of other venous thrombosis and embolism: Secondary | ICD-10-CM | POA: Diagnosis not present

## 2015-11-04 DIAGNOSIS — E1121 Type 2 diabetes mellitus with diabetic nephropathy: Secondary | ICD-10-CM | POA: Diagnosis not present

## 2015-11-04 DIAGNOSIS — I120 Hypertensive chronic kidney disease with stage 5 chronic kidney disease or end stage renal disease: Secondary | ICD-10-CM | POA: Insufficient documentation

## 2015-11-04 DIAGNOSIS — C8510 Unspecified B-cell lymphoma, unspecified site: Secondary | ICD-10-CM | POA: Insufficient documentation

## 2015-11-04 DIAGNOSIS — C801 Malignant (primary) neoplasm, unspecified: Secondary | ICD-10-CM

## 2015-11-04 NOTE — Progress Notes (Signed)
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 His PET scan back in September 2016 did not show any evidence of metastatic disease or primary source of this tumor. He is status post surgical resection and currently has possible recurrent disease.    Past/Anticipated chemotherapy by medical oncology, if any: He is status post CHOP with rituximab chemotherapy with complete response in 2010 for large cell lyphoma. He continues to be in remission.  Patient's main complaints related to symptomatic tumor(s) are: He does not report any blurry vision or double vision or neurological deficits. Does not report any nausea or vomiting.  Pain on a scale of 0-10 is: discomfort around biopsy site. Reports occasional headaches he manages with ibuprofen  If Spine Met(s), symptoms, if any, include:  Bowel/Bladder retention or incontinence (please describe): no  Numbness or weakness in extremities (please describe): no  Current Decadron regimen, if applicable: no  Ambulatory status? Walker? Wheelchair?: ambulatory with cane  SAFETY ISSUES:  Prior radiation? no  Pacemaker/ICD? no  Possible current pregnancy? no  Is the patient on methotrexate? no  Additional Complaints / other details:  65 year old male. Accompanied by daughter in law. Left arm resist due to graft. Appt on 6/9 at 0900 with vascular center for graft exchange to right arm. Patient stopped coumadin on June 3rd in preparation for graft exchange. Patient schedule for PET on Wednesday. Patient takes dialysis on Tuesday, Thursday, and Saturday at [facility]

## 2015-11-04 NOTE — Progress Notes (Signed)
See progress note under physician encounter. 

## 2015-11-04 NOTE — Progress Notes (Signed)
Radiation Oncology         [phone] ________________________________  Initial Outpatient Consultation  Name: JAQUE DACY MRN: [id number]  Date: 11/04/2015  DOB: Jan 28, 1951  WF:UXNATFT,DDUKGUR A, MD  Wyatt Portela, MD   REFERRING PHYSICIAN: Wyatt Portela, MD  DIAGNOSIS: The primary encounter diagnosis was Cancer [facility]). A diagnosis of Sarcoma of scalp ([place]) was also pertinent to this visit.    ICD-9-CM ICD-10-CM   1. Cancer (HCC) 199.1 C80.1   2. Sarcoma of scalp ([place]) 171.0 C49.0      Diffuse large cel lymphoma diagnosed in May 2009. 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  HISTORY OF PRESENT ILLNESS::Alazar Viona Gilmore Decock is a 65 y.o. male who presents today because of a scalp nodule that was suspicious for metastatic carcinoma from a lung or aerodigestive track. He had a local excision of 3 scalp masses in the right frontal parietal region 05/08/2015 per Dr. Brantley Stage. The pathology revealed poorly differentiated sarcomatoid carcinoma. He had a PET CT in October 2016 revealing no evidence of metastatic disease. He was doing well after surgery but noted some smaller lesion around the excision site. He is here today to discuss radiation treatment options. He is accompanied by his daughter-in-law.  He is status post CHOP with rituximab chemotherapy with complete response in 2010 for large cell lymphoma. He continues to be in remission.  He does not report any blurry vision or double vision or neurological deficits. Does not report any nausea or vomiting. He mentions that he has some discomfort around the biopsy site and reports occasional headaches he manages with ibuprofen. He had renal failure and is currently hemodialysis-dependent continuing to receive dialysis. He has dialysis tuesdays, thursdays, and saturdays.   PREVIOUS RADIATION THERAPY: No  PAST MEDICAL HISTORY:  has a past medical history of DVT (deep venous thrombosis) ([place]); Diabetic  retinopathy; Hypertension; Hyperlipidemia; Peripheral vascular disease ([place]); Blood transfusion; Noncompliance (03/16/2012); NSVT (nonsustained ventricular tachycardia) ([place]) (03/18/2012); Anemia; GERD (gastroesophageal reflux disease); Family history of anesthesia complication; IDDM (insulin dependent diabetes mellitus) ([place]); Pneumonia (2013); Headache(784.0); Non Hodgkin's lymphoma (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    PAST SURGICAL HISTORY: Past Surgical History  Procedure Laterality Date  . Portacath placement    . Port-a-cath removal    . Coloscopy    . Pars plana vitrectomy  08/27/2011    Procedure: PARS PLANA VITRECTOMY WITH 25 GAUGE;  Surgeon: Hayden Pedro, MD;  Location: [place];  Service: Ophthalmology;  Laterality: Left;  Repair of complex traction retinal detachment left eye  . Eye surgery Bilateral   . Pars plana vitrectomy  05/10/2012    Procedure: PARS PLANA VITRECTOMY WITH 25 GAUGE;  Surgeon: Hayden Pedro, MD;  Location: [place];  Service: Ophthalmology;  Laterality: Right;  Repair Complex Traction Retinal Detachment  . Membrane peel  05/10/2012    Procedure: MEMBRANE PEEL;  Surgeon: Hayden Pedro, MD;  Location: [place];  Service: Ophthalmology;  Laterality: Right;  . Photocoagulation with laser  05/10/2012    Procedure: PHOTOCOAGULATION WITH LASER;  Surgeon: Hayden Pedro, MD;  Location: [place];  Service: Ophthalmology;  Laterality: Right;  . Gas insertion  05/10/2012    Procedure: INSERTION OF GAS;  Surgeon: Hayden Pedro, MD;  Location: [place];  Service: Ophthalmology;  Laterality: Right;  . Vena cava filter placement  09/2012    due to preparation for surgery  . Av fistula placement Left 02/03/2013    Procedure: ARTERIOVENOUS (AV) FISTULA CREATION- LEFT  RADIAL CEPHALIC; ULTRASOUND GUIDED;  Surgeon: Mal Misty, MD;  Location: [place];  Service: Vascular;  Laterality: Left;  . Cardiac catheterization    . Lesion excision Right 05/08/2015    Procedure: EXCISION SCALP LESION;  Surgeon: Erroll Luna, MD;  Location: [place];  Service: General;  Laterality: Right;  . Colonoscopy N/A 09/23/2015    Procedure: COLONOSCOPY;  Surgeon: Irene Shipper, MD;  Location: [facility];  Service: Endoscopy;  Laterality: N/A;    FAMILY HISTORY: family history includes Anesthesia problems in his son; Diabetes in his father; Hypertension in his father and son; Other in his father. There is no history of Colon cancer.  SOCIAL HISTORY:  reports that he has never smoked. He has never used smokeless tobacco. He reports that he does not drink alcohol or use illicit drugs.  ALLERGIES: Review of patient's allergies indicates no known allergies.  MEDICATIONS:  Current Outpatient Prescriptions  Medication Sig Dispense Refill  . acetaminophen (TYLENOL) 325 MG tablet Take 650 mg by mouth every 6 (six) hours as needed (pain).    [name] 120 MG 24 hr capsule Take 120 mg by mouth daily.    Marland Kitchen HYDROcodone-acetaminophen (NORCO) 5-325 MG tablet Take 1 tablet by mouth every 6 (six) hours as needed for moderate pain. 30 tablet 0  . insulin glargine (LANTUS) 100 UNIT/ML injection Inject 0.3 mLs (30 Units total) into the skin at bedtime. 10 mL 11  . insulin lispro (HUMALOG) 100 UNIT/ML injection Inject 10 Units into the skin 3 (three) times daily with meals.     . lidocaine-prilocaine (EMLA) cream APPLY SMALL AMOUNT TO ACCESS SITE 1 TO 2 HOURS BEFORE DIALYSIS. COVER WITH OCCLUSIVE DRESSING (SARAN WRAP).  6  . multivitamin (RENA-VIT) TABS tablet Take 1 tablet by mouth at bedtime. 30 tablet 0  . polysaccharide iron (NIFEREX) 150 MG CAPS capsule Take 1 capsule (150 mg total) by mouth daily. 30 each 2  . sevelamer carbonate (RENVELA) 800 MG tablet Take 1,600 mg by mouth 3 (three) times  daily with meals.    Manus Gunning BOWEL PREP SOLN Take 1 kit by mouth once. (Patient not taking: Reported on 11/04/2015) 354 mL 0  . warfarin (COUMADIN) 5 MG tablet Take 7.5 mg by mouth every evening. Reported on 11/04/2015     No current facility-administered medications for this encounter.    REVIEW OF SYSTEMS:  A 15 point review of systems is documented in the electronic medical record. This was obtained by the nursing staff. However, I reviewed this with the patient to discuss relevant findings and make appropriate changes.  Pertinent items are noted in HPI.   PHYSICAL EXAM:  height is 6' 3.5" (1.918 m) and weight is 273 lb 11.2 oz (124.15 kg). His blood pressure is 112/74 and his pulse is 97. His respiration is 16 and oxygen saturation is 100%.   Scalp notable for pigmentation loss on right frontal area in two post-op sites.  No distinct notularity.  No facical or neck adenopathy.  Per med-onc  ECOG: 1 General appearance: alert and cooperative chronically ill-appearing without distress. Head: Incision sites appear well healed. Small bumps noted close to the incision sites. Neck: no adenopathy Lymph nodes: Cervical, supraclavicular, and axillary nodes normal. Heart:regular rate and rhythm, S1, S2 normal, no murmur,  click, rub or gallop Lung:chest clear, no wheezing, rales, normal symmetric air entry Abdomin: soft, non-tender, without masses or organomegaly EXT:no erythema, induration, or nodules  KPS = 100  100 - Normal; no complaints; no evidence of disease. 90   - Able to carry on normal activity; minor signs or symptoms of disease. 80   - Normal activity with effort; some signs or symptoms of disease. 71   - Cares for self; unable to carry on normal activity or to do active work. 60   - Requires occasional assistance, but is able to care for most of his personal needs. 50   - Requires considerable assistance and frequent medical care. 68   - Disabled; requires special care and  assistance. 42   - Severely disabled; hospital admission is indicated although death not imminent. 31   - Very sick; hospital admission necessary; active supportive treatment necessary. 10   - Moribund; fatal processes progressing rapidly. 0     - Dead  Karnofsky DA, Abelmann [place], Craver LS and Burchenal [facility] [phone]) The use of the nitrogen mustards in the palliative treatment of carcinoma: with particular reference to bronchogenic carcinoma Cancer 1 634-56  LABORATORY DATA:  Lab Results  Component Value Date   WBC 2.6* 05/03/2015   HGB 11.4* 05/03/2015   HCT 34.9* 05/03/2015   MCV 82.5 05/03/2015   PLT 99* 05/03/2015   Lab Results  Component Value Date   NA 131* 05/03/2015   K 3.8 05/03/2015   CL 91* 05/03/2015   CO2 29 05/03/2015   Lab Results  Component Value Date   ALT 26 05/03/2015   AST 39 05/03/2015   ALKPHOS 233* 05/03/2015   BILITOT 1.6* 05/03/2015   RADIOGRAPHY: No results found.  IMPRESSION: Mr. 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 We reviewed his PET scan from October 2016 and discussed that there was no evidence of metastatic disease at this time.  PLAN: He has a CT of the head and chest scheduled 11/06/2015. I will follow up on the CT.  I will submit this case for melanoma and sarcoma conference 11/22/2015. He has a follow up appointment 11/22/2015 at 1 pm.   We discussed the role of radiation and its side effects. He has a follow up appointment with Dr. Osker Mason 12/06/2015. His daughter-in-law would like him to receive radiation treatment Monday, Wednesday, Friday to avoid doing it on days he receives dialysis. We will discuss this more when I see him at the end of June.   I spent 40 minutes minutes face to face with the patient and more than 50% of that time was spent in counseling and/or coordination of care.   ------------------------------------------------  Sheral Apley. Tammi Klippel, [name]    This document  serves as a record of services personally performed by Tyler Pita, MD. It was created on his behalf by Lendon Collar, a trained medical scribe. The creation of this record is based on the scribe's personal observations and the provider's statements to them. This document has been checked and approved by the attending provider.

## 2015-11-05 DIAGNOSIS — E1129 Type 2 diabetes mellitus with other diabetic kidney complication: Secondary | ICD-10-CM | POA: Diagnosis not present

## 2015-11-05 DIAGNOSIS — D509 Iron deficiency anemia, unspecified: Secondary | ICD-10-CM | POA: Diagnosis not present

## 2015-11-05 DIAGNOSIS — N186 End stage renal disease: Secondary | ICD-10-CM | POA: Diagnosis not present

## 2015-11-05 DIAGNOSIS — D631 Anemia in chronic kidney disease: Secondary | ICD-10-CM | POA: Diagnosis not present

## 2015-11-05 DIAGNOSIS — E8779 Other fluid overload: Secondary | ICD-10-CM | POA: Diagnosis not present

## 2015-11-05 DIAGNOSIS — N2581 Secondary hyperparathyroidism of renal origin: Secondary | ICD-10-CM | POA: Diagnosis not present

## 2015-11-06 ENCOUNTER — Ambulatory Visit (HOSPITAL_COMMUNITY)
Admission: RE | Admit: 2015-11-06 | Discharge: 2015-11-06 | Disposition: A | Discharge status: Home / Self Care | Payer: Medicare Other | Source: Ambulatory Visit | Attending: Oncology | Admitting: Oncology

## 2015-11-06 ENCOUNTER — Encounter (HOSPITAL_COMMUNITY): Payer: Self-pay

## 2015-11-06 DIAGNOSIS — C801 Malignant (primary) neoplasm, unspecified: Secondary | ICD-10-CM | POA: Insufficient documentation

## 2015-11-06 DIAGNOSIS — R188 Other ascites: Secondary | ICD-10-CM | POA: Diagnosis not present

## 2015-11-06 DIAGNOSIS — R601 Generalized edema: Secondary | ICD-10-CM | POA: Insufficient documentation

## 2015-11-06 DIAGNOSIS — I708 Atherosclerosis of other arteries: Secondary | ICD-10-CM | POA: Diagnosis not present

## 2015-11-06 DIAGNOSIS — R51 Headache: Secondary | ICD-10-CM | POA: Diagnosis not present

## 2015-11-06 DIAGNOSIS — IMO0002 Reserved for concepts with insufficient information to code with codable children: Secondary | ICD-10-CM

## 2015-11-06 DIAGNOSIS — Z8572 Personal history of non-Hodgkin lymphomas: Secondary | ICD-10-CM | POA: Diagnosis not present

## 2015-11-06 DIAGNOSIS — R42 Dizziness and giddiness: Secondary | ICD-10-CM | POA: Diagnosis not present

## 2015-11-06 NOTE — Progress Notes (Signed)
LATE NOTE:  CSW received referral from radiation oncology for transportation resources.  CSW met with patient and daughter in law in exam room.  The patient lives in [place] and does not have Medicaid.  Transportation resources are very limited and the patient has limited family support that is able to provide transportation.  CSW provided information on [place] (unsure if they drive to [facility]) and [place].  Encouraged patient/family to discuss needs with each agency, once appointments have been scheduled patient can request rides.  Patient/family verbalized understanding and plan to contact CSW with questions or concerns.  Polo Riley, MSW, LCSW, OSW-C Clinical Social Worker [facility] [phone]

## 2015-11-07 ENCOUNTER — Encounter (HOSPITAL_COMMUNITY): Payer: Self-pay | Admitting: *Deleted

## 2015-11-07 DIAGNOSIS — D631 Anemia in chronic kidney disease: Secondary | ICD-10-CM | POA: Diagnosis not present

## 2015-11-07 DIAGNOSIS — D509 Iron deficiency anemia, unspecified: Secondary | ICD-10-CM | POA: Diagnosis not present

## 2015-11-07 DIAGNOSIS — N186 End stage renal disease: Secondary | ICD-10-CM | POA: Diagnosis not present

## 2015-11-07 DIAGNOSIS — N2581 Secondary hyperparathyroidism of renal origin: Secondary | ICD-10-CM | POA: Diagnosis not present

## 2015-11-07 DIAGNOSIS — E1129 Type 2 diabetes mellitus with other diabetic kidney complication: Secondary | ICD-10-CM | POA: Diagnosis not present

## 2015-11-07 DIAGNOSIS — E8779 Other fluid overload: Secondary | ICD-10-CM | POA: Diagnosis not present

## 2015-11-07 NOTE — Progress Notes (Signed)
Pt has hx of NSVT and takes Cartia XT. States he hasn't seen a cardiologist in a long time. States Dr. Reynaldo Minium is who he sees for this. Pt is diabetic, states fasting blood sugar is usually aroung 135-150. Instructed pt to take 1/2 of his regular dose of Lantus Insulin tonight (he'll take 15 units tonight) and not to take his Humalog insulin in the AM. Pt states his CBG batteries died today.

## 2015-11-08 ENCOUNTER — Encounter (HOSPITAL_COMMUNITY)
Admission: RE | Disposition: A | Discharge status: Home / Self Care | Payer: Self-pay | Source: Ambulatory Visit | Attending: Surgery

## 2015-11-08 ENCOUNTER — Ambulatory Visit (HOSPITAL_COMMUNITY)
Admission: RE | Admit: 2015-11-08 | Discharge: 2015-11-08 | Disposition: A | Discharge status: Home / Self Care | Payer: Medicare Other | Source: Ambulatory Visit | Attending: Surgery | Admitting: Surgery

## 2015-11-08 ENCOUNTER — Ambulatory Visit (HOSPITAL_COMMUNITY): Payer: Medicare Other | Admitting: Anesthesiology

## 2015-11-08 ENCOUNTER — Encounter (HOSPITAL_COMMUNITY): Payer: Self-pay | Admitting: *Deleted

## 2015-11-08 ENCOUNTER — Other Ambulatory Visit: Payer: Self-pay | Admitting: *Deleted

## 2015-11-08 DIAGNOSIS — Z992 Dependence on renal dialysis: Secondary | ICD-10-CM | POA: Insufficient documentation

## 2015-11-08 DIAGNOSIS — Z7901 Long term (current) use of anticoagulants: Secondary | ICD-10-CM | POA: Insufficient documentation

## 2015-11-08 DIAGNOSIS — Z86718 Personal history of other venous thrombosis and embolism: Secondary | ICD-10-CM | POA: Insufficient documentation

## 2015-11-08 DIAGNOSIS — G4730 Sleep apnea, unspecified: Secondary | ICD-10-CM | POA: Insufficient documentation

## 2015-11-08 DIAGNOSIS — K219 Gastro-esophageal reflux disease without esophagitis: Secondary | ICD-10-CM | POA: Diagnosis not present

## 2015-11-08 DIAGNOSIS — I472 Ventricular tachycardia: Secondary | ICD-10-CM | POA: Insufficient documentation

## 2015-11-08 DIAGNOSIS — E11319 Type 2 diabetes mellitus with unspecified diabetic retinopathy without macular edema: Secondary | ICD-10-CM | POA: Insufficient documentation

## 2015-11-08 DIAGNOSIS — E1122 Type 2 diabetes mellitus with diabetic chronic kidney disease: Secondary | ICD-10-CM | POA: Insufficient documentation

## 2015-11-08 DIAGNOSIS — I120 Hypertensive chronic kidney disease with stage 5 chronic kidney disease or end stage renal disease: Secondary | ICD-10-CM | POA: Diagnosis not present

## 2015-11-08 DIAGNOSIS — N186 End stage renal disease: Secondary | ICD-10-CM | POA: Diagnosis not present

## 2015-11-08 DIAGNOSIS — Z794 Long term (current) use of insulin: Secondary | ICD-10-CM | POA: Diagnosis not present

## 2015-11-08 DIAGNOSIS — Z79899 Other long term (current) drug therapy: Secondary | ICD-10-CM | POA: Diagnosis not present

## 2015-11-08 DIAGNOSIS — N184 Chronic kidney disease, stage 4 (severe): Secondary | ICD-10-CM | POA: Diagnosis not present

## 2015-11-08 DIAGNOSIS — Z8572 Personal history of non-Hodgkin lymphomas: Secondary | ICD-10-CM | POA: Diagnosis not present

## 2015-11-08 DIAGNOSIS — Z4931 Encounter for adequacy testing for hemodialysis: Secondary | ICD-10-CM

## 2015-11-08 DIAGNOSIS — D649 Anemia, unspecified: Secondary | ICD-10-CM | POA: Diagnosis not present

## 2015-11-08 DIAGNOSIS — I739 Peripheral vascular disease, unspecified: Secondary | ICD-10-CM | POA: Diagnosis not present

## 2015-11-08 HISTORY — DX: Unspecified malignant neoplasm of skin, unspecified: C44.90

## 2015-11-08 HISTORY — PX: AV FISTULA PLACEMENT: SHX1204

## 2015-11-08 LAB — POCT I-STAT 4, (NA,K, GLUC, HGB,HCT)
Glucose, Bld: 100 mg/dL — ABNORMAL HIGH (ref 65–99)
HCT: 38 % — ABNORMAL LOW (ref 39.0–52.0)
Hemoglobin: 12.9 g/dL — ABNORMAL LOW (ref 13.0–17.0)
Potassium: 4.2 mmol/L (ref 3.5–5.1)
Sodium: 133 mmol/L — ABNORMAL LOW (ref 135–145)

## 2015-11-08 LAB — GLUCOSE, CAPILLARY: Glucose-Capillary: 116 mg/dL — ABNORMAL HIGH (ref 65–99)

## 2015-11-08 LAB — PROTIME-INR
INR: 1.37 (ref 0.00–1.49)
Prothrombin Time: 17 seconds — ABNORMAL HIGH (ref 11.6–15.2)

## 2015-11-08 SURGERY — ARTERIOVENOUS (AV) FISTULA CREATION
Anesthesia: Monitor Anesthesia Care | Site: Arm Upper | Laterality: Right

## 2015-11-08 MED ORDER — SODIUM CHLORIDE 0.9 % IV SOLN
INTRAVENOUS | Status: DC | PRN
  Administered 2015-11-08: 09:00:00

## 2015-11-08 MED ORDER — DEXTROSE 5 % IV SOLN
1.5000 g | INTRAVENOUS | Status: AC
  Administered 2015-11-08: 1.5 g via INTRAVENOUS
  Filled 2015-11-08: qty 1.5

## 2015-11-08 MED ORDER — SODIUM CHLORIDE 0.9 % IV SOLN
INTRAVENOUS | Status: DC
  Administered 2015-11-08: 08:00:00 via INTRAVENOUS

## 2015-11-08 MED ORDER — LIDOCAINE-EPINEPHRINE (PF) 1 %-1:200000 IJ SOLN
INTRAMUSCULAR | Status: DC | PRN
  Administered 2015-11-08: 30 mL

## 2015-11-08 MED ORDER — MIDAZOLAM HCL 2 MG/2ML IJ SOLN
INTRAMUSCULAR | Status: AC
Start: 2015-11-08 — End: 2015-11-08
  Filled 2015-11-08: qty 2

## 2015-11-08 MED ORDER — 0.9 % SODIUM CHLORIDE (POUR BTL) OPTIME
TOPICAL | Status: DC | PRN
  Administered 2015-11-08: 1000 mL

## 2015-11-08 MED ORDER — ONDANSETRON HCL 4 MG/2ML IJ SOLN: 4.0000 mg | Freq: Once | INTRAMUSCULAR | Status: DC | PRN

## 2015-11-08 MED ORDER — ONDANSETRON HCL 4 MG/2ML IJ SOLN
INTRAMUSCULAR | Status: DC | PRN
  Administered 2015-11-08: 4 mg via INTRAVENOUS

## 2015-11-08 MED ORDER — HEPARIN SODIUM (PORCINE) 1000 UNIT/ML IJ SOLN
INTRAMUSCULAR | Status: DC | PRN
  Administered 2015-11-08: 3000 [IU] via INTRAVENOUS

## 2015-11-08 MED ORDER — MIDAZOLAM HCL 5 MG/5ML IJ SOLN
INTRAMUSCULAR | Status: DC | PRN
  Administered 2015-11-08 (×2): 1 mg via INTRAVENOUS

## 2015-11-08 MED ORDER — PROPOFOL 10 MG/ML IV BOLUS
INTRAVENOUS | Status: AC
  Filled 2015-11-08: qty 40

## 2015-11-08 MED ORDER — FENTANYL CITRATE (PF) 100 MCG/2ML IJ SOLN: 25.0000 ug | INTRAMUSCULAR | Status: DC | PRN

## 2015-11-08 MED ORDER — PROPOFOL 500 MG/50ML IV EMUL
INTRAVENOUS | Status: DC | PRN
  Administered 2015-11-08: 100 ug/kg/min via INTRAVENOUS
  Administered 2015-11-08: 10:00:00 via INTRAVENOUS

## 2015-11-08 MED ORDER — CHLORHEXIDINE GLUCONATE CLOTH 2 % EX PADS: 6.0000 | MEDICATED_PAD | Freq: Once | CUTANEOUS | Status: DC

## 2015-11-08 MED ORDER — OXYCODONE-ACETAMINOPHEN 5-325 MG PO TABS: 1.0000 | ORAL_TABLET | Freq: Four times a day (QID) | ORAL | Status: DC | PRN

## 2015-11-08 MED ORDER — HEPARIN SODIUM (PORCINE) 1000 UNIT/ML IJ SOLN
INTRAMUSCULAR | Status: AC
  Filled 2015-11-08: qty 1

## 2015-11-08 MED ORDER — PROTAMINE SULFATE 10 MG/ML IV SOLN
INTRAVENOUS | Status: DC | PRN
  Administered 2015-11-08: 25 mg via INTRAVENOUS

## 2015-11-08 MED ORDER — LIDOCAINE 2% (20 MG/ML) 5 ML SYRINGE
INTRAMUSCULAR | Status: DC | PRN
  Administered 2015-11-08: 80 mg via INTRAVENOUS

## 2015-11-08 MED ORDER — FENTANYL CITRATE (PF) 250 MCG/5ML IJ SOLN
INTRAMUSCULAR | Status: DC | PRN
  Administered 2015-11-08 (×2): 25 ug via INTRAVENOUS
  Administered 2015-11-08: 50 ug via INTRAVENOUS

## 2015-11-08 MED ORDER — FENTANYL CITRATE (PF) 250 MCG/5ML IJ SOLN
INTRAMUSCULAR | Status: AC
  Filled 2015-11-08: qty 5

## 2015-11-08 SURGICAL SUPPLY — 36 items
ARMBAND PINK RESTRICT EXTREMIT (MISCELLANEOUS) ×2 IMPLANT
CANISTER SUCTION 2500CC (MISCELLANEOUS) ×2 IMPLANT
CATH EMB 3FR 80CM (CATHETERS) ×1 IMPLANT
CLIP TI MEDIUM 6 (CLIP) ×2 IMPLANT
CLIP TI WIDE RED SMALL 6 (CLIP) ×2 IMPLANT
COVER PROBE W GEL 5X96 (DRAPES) ×2 IMPLANT
ELECT REM PT RETURN 9FT ADLT (ELECTROSURGICAL) ×2
ELECTRODE REM PT RTRN 9FT ADLT (ELECTROSURGICAL) ×1 IMPLANT
GLOVE BIOGEL PI IND STRL 7.0 (GLOVE) IMPLANT
GLOVE BIOGEL PI IND STRL 7.5 (GLOVE) ×1 IMPLANT
GLOVE BIOGEL PI IND STRL 8 (GLOVE) IMPLANT
GLOVE BIOGEL PI INDICATOR 7.0 (GLOVE) ×5
GLOVE BIOGEL PI INDICATOR 7.5 (GLOVE) ×2
GLOVE BIOGEL PI INDICATOR 8 (GLOVE) ×1
GLOVE ECLIPSE 6.5 STRL STRAW (GLOVE) ×1 IMPLANT
GLOVE ECLIPSE 7.0 STRL STRAW (GLOVE) ×1 IMPLANT
GLOVE SURG SS PI 7.5 STRL IVOR (GLOVE) ×2 IMPLANT
GOWN STRL REUS W/ TWL LRG LVL3 (GOWN DISPOSABLE) ×2 IMPLANT
GOWN STRL REUS W/ TWL XL LVL3 (GOWN DISPOSABLE) ×1 IMPLANT
GOWN STRL REUS W/TWL LRG LVL3 (GOWN DISPOSABLE) ×6
GOWN STRL REUS W/TWL XL LVL3 (GOWN DISPOSABLE) ×2
HEMOSTAT SNOW SURGICEL 2X4 (HEMOSTASIS) IMPLANT
KIT BASIN OR (CUSTOM PROCEDURE TRAY) ×2 IMPLANT
KIT ROOM TURNOVER OR (KITS) ×2 IMPLANT
LIQUID BAND (GAUZE/BANDAGES/DRESSINGS) ×2 IMPLANT
MARKER SKIN DUAL TIP RULER LAB (MISCELLANEOUS) ×1 IMPLANT
NS IRRIG 1000ML POUR BTL (IV SOLUTION) ×2 IMPLANT
PACK CV ACCESS (CUSTOM PROCEDURE TRAY) ×2 IMPLANT
PAD ARMBOARD 7.5X6 YLW CONV (MISCELLANEOUS) ×4 IMPLANT
SUT PROLENE 6 0 CC (SUTURE) ×2 IMPLANT
SUT VIC AB 3-0 SH 27 (SUTURE) ×2
SUT VIC AB 3-0 SH 27X BRD (SUTURE) ×1 IMPLANT
SUT VICRYL 4-0 PS2 18IN ABS (SUTURE) IMPLANT
SYRINGE 3CC LL L/F (MISCELLANEOUS) ×1 IMPLANT
UNDERPAD 30X30 INCONTINENT (UNDERPADS AND DIAPERS) ×2 IMPLANT
WATER STERILE IRR 1000ML POUR (IV SOLUTION) ×2 IMPLANT

## 2015-11-08 NOTE — Progress Notes (Signed)
Dr. Trula Slade in to see pt, okay to insert IV in left hand per him since surgery is taking place in right arm and pt has functioning fistula in left upper arm.

## 2015-11-08 NOTE — Anesthesia Procedure Notes (Signed)
Procedure Name: MAC Date/Time: 11/08/2015 8:13 AM Performed by: Layla Maw Pre-anesthesia Checklist: Patient identified, Patient being monitored, Timeout performed, Emergency Drugs available and Suction available Patient Re-evaluated:Patient Re-evaluated prior to inductionOxygen Delivery Method: Simple face mask and Nasal cannula Preoxygenation: Pre-oxygenation with 100% oxygen Number of attempts: 1 Placement Confirmation: positive ETCO2 Dental Injury: Teeth and Oropharynx as per pre-operative assessment

## 2015-11-08 NOTE — Op Note (Signed)
    Patient name: [name] MRN: [id number] DOB: 1951/03/10 Sex: male  11/08/2015 Pre-operative Diagnosis: End-stage renal disease Post-operative diagnosis:  Same Surgeon:  [name] Assistants:  Gerri Lins Procedure:   First stage right basilic vein transposition Anesthesia:  Mac Blood Loss:  See anesthesia record Specimens:  None  Findings:  Radial artery was circumferentially calcified and I felt that it was not usable.  He had a thickened basilic vein however it did dilate nicely.  The vein measured approximate 4 mm.  The brachial artery was also partially 4 mm with moderate to severe calcification  Indications:  The patient has a poorly functioning left radiocephalic fistula.  He is here today for new access on the right  Procedure:  The patient was identified in the holding area and taken to [place] [facility]  The patient was then placed supine on the table. MAC anesthesia was administered.  The patient was prepped and draped in the usual sterile fashion.  A time out was called and antibiotics were administered.  I evaluated the radial artery and cephalic vein at the wrist with ultrasound.  I felt that the calcification was circumferential in the radial artery and most likely the artery would not be usable.  Therefore I elected to proceed with a first stage basilic vein transposition.  The basilic vein was evaluated with ultrasound it looked widely patent and easily compressible.  The brachial artery was also calcified.  I made a transverse incision after using local anesthesia.  A dissected out the basilic vein.  There were 2 large branches.  I elected to use the main basilic vein.  In situ, it measured approximately 3.5 mm.  It and then marked for orientation.  I then dissected out the brachial artery.  This is heavily calcified down near the antecubital crease and therefore I elected to dissect more proximally where the artery was still diseased but had a soft anterior wall.  3000  units of heparin were given.  The vein was ligated distally.  It distended nicely with heparinized saline to approximately 4 mm.  The brachial artery was then occluded with Serafin clamps.  A #11 blade was used to make an arteriotomy which was extended longitudinally with Potts scissors.  The vein was cut to the appropriate length and spatulated to fit the size of the arteriotomy.  A running anastomosis was created with 6-0 Prolene.  Prior to completion, the appropriate flushing maneuvers were performed and the anastomosis was completed.  There was good hemostasis.  There was an excellent thrill within the vein and the patient had good Doppler signals in the radial and ulnar artery.  25 mg of protamine was given.  Once hemostasis was satisfactory the incision was closed with 2 layers of 3-0 Vicryl followed by Dermabond   Disposition:  To PACU in stable condition.   Theotis Burrow, [name] [facility] Office: [phone] Pager:  [phone]

## 2015-11-08 NOTE — Anesthesia Postprocedure Evaluation (Signed)
Anesthesia Post Note  Patient: [name]  Procedure(s) Performed: Procedure(s) (LRB): RIGHT BRACHIOCEPHALIC ARTERIOVENOUS (AV) FISTULA CREATION (Right)  Patient location during evaluation: PACU Anesthesia Type: MAC Level of consciousness: awake and alert Pain management: pain level controlled Vital Signs Assessment: post-procedure vital signs reviewed and stable Respiratory status: spontaneous breathing, nonlabored ventilation, respiratory function stable and patient connected to nasal cannula oxygen Cardiovascular status: stable and blood pressure returned to baseline Anesthetic complications: no    Last Vitals:  Filed Vitals:   11/08/15 1010 11/08/15 1011  BP:  114/68  Pulse: 98   Temp: 36.4 C   Resp:  14    Last Pain: There were no vitals filed for this visit.               Zenaida Deed

## 2015-11-08 NOTE — Transfer of Care (Signed)
Immediate Anesthesia Transfer of Care Note  Patient: KWMANE BRADEN  Procedure(s) Performed: Procedure(s): RIGHT BRACHIOCEPHALIC ARTERIOVENOUS (AV) FISTULA CREATION (Right)  Patient Location: PACU  Anesthesia Type:MAC  Level of Consciousness: sedated  Airway & Oxygen Therapy: Patient Spontanous Breathing and Patient connected to nasal cannula oxygen  Post-op Assessment: Report given to RN and Post -op Vital signs reviewed and stable  Post vital signs: Reviewed and stable  Last Vitals:  Filed Vitals:   11/08/15 0750  BP: 139/85  Pulse: 110  Temp: 36.6 C  Resp: 18    Last Pain: There were no vitals filed for this visit.    Patients Stated Pain Goal: 3 ([id number])  Complications: No apparent anesthesia complications

## 2015-11-08 NOTE — H&P (Signed)
Patient name: John Hansen PayneMRN: [id number] DOB: 03-24-1952Sex: male    Chief Complaint  Patient presents with  . Re-evaluation    Decreased flow in Left FA AVF per Dr. Posey Pronto (fistulogram 08-16-15) Had Vein mapping here on 09-09-15 Had HD on Tues, Thurs and Sat at [facility])    HISTORY OF PRESENT ILLNESS: The patient is here today for follow-up and discussions regarding dialysis access. The patient has a history of a left radiocephalic fistula by Dr. Kellie Simmering in 2014. He has been having difficulty with bleeding after access. He states that he has a greenlight during his dialysis. He recently had a fistulogram secondary to poor flow.  The patient has a history of DVT and is on Coumadin.  Past Medical History  Diagnosis Date  . DVT (deep venous thrombosis) ([place])     "got one in my right leg now; I've had one before too, not sure which leg" (08/05/2013)  . Diabetic retinopathy   . Hypertension   . Hyperlipidemia   . Peripheral vascular disease ([place])   . Blood transfusion     "years ago; blood was low" (08/05/2013)  . Noncompliance 03/16/2012  . NSVT (nonsustained ventricular tachycardia) ([place]) 03/18/2012  . Anemia   . GERD (gastroesophageal reflux disease)     uses alka seltzere on occas.   . Family history of anesthesia complication     " my son wakes up slowly"  . IDDM (insulin dependent diabetes mellitus) ([place])   . Pneumonia 2013    hosp.-   [name])     "one q now and then" (08/05/2013)  . Non Hodgkin's lymphoma ([place])     Tx 2009; "had chemo; it went away" (08/05/2013)  . 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 Diabetic nephropathy ([place])   . CKD (chronic kidney disease) stage 4, GFR 15-29 ml/min (HCC) 03/18/2012    [facility]- T,TH,Sat.  . ESRD (end stage renal disease) on  dialysis [facility])     "just started today, (08/04/2013)"  . Arthritis     HNP- lumbar, "all over my body"  . Poor historian     pt. unsure of several answers to health history questions   . Sleep apnea     "suppose to have a sleep study, but they never told me when. (08/05/2013)    Past Surgical History  Procedure Laterality Date  . Portacath placement    . Port-a-cath removal    . Coloscopy    . Pars plana vitrectomy  08/27/2011    Procedure: PARS PLANA VITRECTOMY WITH 25 GAUGE; Surgeon: Hayden Pedro, MD; Location: [place]; Service: Ophthalmology; Laterality: Left; Repair of complex traction retinal detachment left eye  . Eye surgery Bilateral   . Pars plana vitrectomy  05/10/2012    Procedure: PARS PLANA VITRECTOMY WITH 25 GAUGE; Surgeon: Hayden Pedro, MD; Location: [place]; Service: Ophthalmology; Laterality: Right; Repair Complex Traction Retinal Detachment  . Membrane peel  05/10/2012    Procedure: MEMBRANE PEEL; Surgeon: Hayden Pedro, MD; Location: [place]; Service: Ophthalmology; Laterality: Right;  . Photocoagulation with laser  05/10/2012    Procedure: PHOTOCOAGULATION WITH LASER; Surgeon: Hayden Pedro, MD; Location: [place]; Service: Ophthalmology; Laterality: Right;  . Gas insertion  05/10/2012    Procedure: INSERTION OF GAS; Surgeon: Hayden Pedro, MD; Location: [place]; Service: Ophthalmology; Laterality: Right;  . Vena cava filter placement  09/2012    due to preparation for surgery  .  Av fistula placement Left 02/03/2013    Procedure: ARTERIOVENOUS (AV) FISTULA CREATION- LEFT RADIAL CEPHALIC; ULTRASOUND GUIDED; Surgeon: Mal Misty, MD; Location: [place]; Service: Vascular; Laterality: Left;  . Cardiac catheterization    . Lesion excision Right 05/08/2015    Procedure: EXCISION SCALP LESION; Surgeon: Erroll Luna, MD; Location: [place]; Service:  General; Laterality: Right;    Social History   Social History  . Marital Status: Single    Spouse Name: N/A  . Number of Children: 1  . Years of Education: N/A   Occupational History  . Retired      Administrator   Social History Main Topics  . Smoking status: Never Smoker   . Smokeless tobacco: Never Used  . Alcohol Use: No  . Drug Use: No  . Sexual Activity: Not Currently   Other Topics Concern  . Not on file   Social History Narrative    Family History  Problem Relation Age of Onset  . Anesthesia problems Son   . Hypertension Son   . Diabetes Father   . Hypertension Father   . Other Father     amputation  . Colon cancer Neg Hx     Allergies as of 09/16/2015  . (No Known Allergies)    Current Outpatient Prescriptions on File Prior to Visit  Medication Sig Dispense Refill  . acetaminophen (TYLENOL) 325 MG tablet Take 650 mg by mouth every 6 (six) hours as needed (pain).    [name] 120 MG 24 hr capsule Take 120 mg by mouth daily.    Marland Kitchen HYDROcodone-acetaminophen (NORCO) 5-325 MG tablet Take 1 tablet by mouth every 6 (six) hours as needed for moderate pain. 30 tablet 0  . insulin glargine (LANTUS) 100 UNIT/ML injection Inject 0.3 mLs (30 Units total) into the skin at bedtime. 10 mL 11  . insulin lispro (HUMALOG) 100 UNIT/ML injection Inject 10 Units into the skin 3 (three) times daily with meals.     . lidocaine-prilocaine (EMLA) cream APPLY SMALL AMOUNT TO ACCESS SITE 1 TO 2 HOURS BEFORE DIALYSIS. COVER WITH OCCLUSIVE DRESSING (SARAN WRAP).  6  . multivitamin (RENA-VIT) TABS tablet Take 1 tablet by mouth at bedtime. 30 tablet 0  . polysaccharide iron (NIFEREX) 150 MG CAPS capsule Take 1 capsule (150 mg total) by mouth daily. 30 each 2  . sevelamer carbonate (RENVELA) 800 MG tablet Take 1,600 mg by mouth 3 (three) times  daily with meals.    Manus Gunning BOWEL PREP SOLN Take 1 kit by mouth once. 354 mL 0  . warfarin (COUMADIN) 5 MG tablet Take 7.5 mg by mouth every evening.      No current facility-administered medications on file prior to visit.     REVIEW OF SYSTEMS: Cardiovascular: No chest pain, chest pressure, palpitations, orthopnea, or dyspnea on exertion. Positive for leg swelling. Positive DVT Pulmonary: No productive cough, asthma or wheezing. Neurologic: No weakness, paresthesias, aphasia, or amaurosis. No dizziness. Hematologic: No bleeding problems or clotting disorders. Musculoskeletal: No joint pain or joint swelling. Gastrointestinal: On dialysis Genitourinary: No dysuria or hematuria. Psychiatric:: No history of major depression. Integumentary: No rashes or ulcers. Constitutional: No fever or chills.  PHYSICAL EXAMINATION:  Vital signs are  Filed Vitals:   09/16/15 1133  BP: 109/76  Pulse: 103  Temp: 96.9 F (36.1 C)  TempSrc: Oral  Resp: 18  Height: 6' 3.5" (1.918 m)  Weight: 264 lb (119.75 kg)  SpO2: 97%   Body mass index is 32.55  kg/(m^2). General: The patient appears their stated age. HEENT: No gross abnormalities Pulmonary: Non labored breathing Abdomen: Soft and non-tender Musculoskeletal: There are no major deformities. Neurologic: No focal weakness or paresthesias are detected, Skin: There are no ulcer or rashes noted. Psychiatric: The patient has normal affect. Cardiovascular: Palpable thrill within left radiocephalic fistula with aneurysmal changes in 2 separate areas of the forearm fistula. Neither is large enough for surgical revision   Diagnostic Studies The patient had vein mapping today. On the left it appears that he has very poor outflow from the radiocephalic fistula again the antecubital crease. On the right the cephalic vein is not visualized in the upper arm. The right side vein drains into the basilic system  which is of adequate caliber  Assessment: End-stage renal disease Plan: I spoke with Dr. Posey Pronto regarding his fistulogram. Do not have any images for review. The plan is to continue to use his left radiocephalic fistula until the flow rates suggested is pending failure. At that time the next step would be to proceed with a right arm access. My feeling is that the first option would be a right radiocephalic fistula. At the time of the procedure, I would like to evaluate the cephalic vein likely by passing a Fogarty catheter to see if he goes into the central venous system and following adequate outflow. If this is the case a right radiocephalic fistula would suffice. If not, he would be a candidate for a right basilic vein transposition either done all at one procedure versus a staged procedure. I discussed each operation in detail with the patient his family and they are willing to proceed. Of note he is currently on Coumadin so he will need to be off of Coumadin for his procedure.  Eldridge Abrahams, [name] [facility] Office: [phone] Pager: [phone]       No changes from above CV:Reg Pulm: CTA Abd soft Plan right sided av access.  All questions answered  [name]

## 2015-11-08 NOTE — Anesthesia Preprocedure Evaluation (Addendum)
Anesthesia Evaluation  Patient identified by MRN, date of birth, ID band Patient awake    Reviewed: Allergy & Precautions, H&P , NPO status , Patient's Chart, lab work & pertinent test results  Airway Mallampati: II   Neck ROM: full    Dental  (+) Poor Dentition   Pulmonary shortness of breath, sleep apnea ,    breath sounds clear to auscultation       Cardiovascular hypertension, + Peripheral Vascular Disease and + DVT   Rhythm:regular Rate:Normal     Neuro/Psych  Headaches,    GI/Hepatic GERD  ,  Endo/Other  diabetes, Type 2, Insulin Dependent  Renal/GU ESRF and DialysisRenal disease     Musculoskeletal  (+) Arthritis ,   Abdominal   Peds  Hematology Non-Hodgkin's lymphoma   Anesthesia Other Findings   Reproductive/Obstetrics                            Anesthesia Physical  Anesthesia Plan  ASA: III  Anesthesia Plan: MAC   Post-op Pain Management:    Induction: Intravenous  Airway Management Planned: Simple Face Mask  Additional Equipment:   Intra-op Plan:   Post-operative Plan:   Informed Consent: I have reviewed the patients History and Physical, chart, labs and discussed the procedure including the risks, benefits and alternatives for the proposed anesthesia with the patient or authorized representative who has indicated his/her understanding and acceptance.     Plan Discussed with: CRNA, Anesthesiologist and Surgeon  Anesthesia Plan Comments: (Dialysis on TTS, has done well with previous mac anesthesia )        Anesthesia Quick Evaluation

## 2015-11-09 DIAGNOSIS — N186 End stage renal disease: Secondary | ICD-10-CM | POA: Diagnosis not present

## 2015-11-09 DIAGNOSIS — E1129 Type 2 diabetes mellitus with other diabetic kidney complication: Secondary | ICD-10-CM | POA: Diagnosis not present

## 2015-11-09 DIAGNOSIS — D509 Iron deficiency anemia, unspecified: Secondary | ICD-10-CM | POA: Diagnosis not present

## 2015-11-09 DIAGNOSIS — E8779 Other fluid overload: Secondary | ICD-10-CM | POA: Diagnosis not present

## 2015-11-09 DIAGNOSIS — N2581 Secondary hyperparathyroidism of renal origin: Secondary | ICD-10-CM | POA: Diagnosis not present

## 2015-11-09 DIAGNOSIS — D631 Anemia in chronic kidney disease: Secondary | ICD-10-CM | POA: Diagnosis not present

## 2015-11-11 ENCOUNTER — Encounter (HOSPITAL_COMMUNITY): Payer: Self-pay | Admitting: Surgery

## 2015-11-12 ENCOUNTER — Telehealth: Payer: Self-pay | Admitting: Surgery

## 2015-11-12 DIAGNOSIS — E8779 Other fluid overload: Secondary | ICD-10-CM | POA: Diagnosis not present

## 2015-11-12 DIAGNOSIS — D509 Iron deficiency anemia, unspecified: Secondary | ICD-10-CM | POA: Diagnosis not present

## 2015-11-12 DIAGNOSIS — N186 End stage renal disease: Secondary | ICD-10-CM | POA: Diagnosis not present

## 2015-11-12 DIAGNOSIS — N2581 Secondary hyperparathyroidism of renal origin: Secondary | ICD-10-CM | POA: Diagnosis not present

## 2015-11-12 DIAGNOSIS — D631 Anemia in chronic kidney disease: Secondary | ICD-10-CM | POA: Diagnosis not present

## 2015-11-12 DIAGNOSIS — E1129 Type 2 diabetes mellitus with other diabetic kidney complication: Secondary | ICD-10-CM | POA: Diagnosis not present

## 2015-11-12 NOTE — Telephone Encounter (Signed)
-----   Message from Mena Goes, RN sent at 11/08/2015  1:08 PM EDT ----- Regarding: schedule   ----- Message -----    From: Serafina Mitchell, MD    Sent: 11/08/2015  11:39 AM      To: [facility] Charge Pool  11/08/2015: Surgeon:  [name] Assistants:  Gerri Lins Procedure:   First stage right basilic vein transposition   Follow-up 6 weeks with duplex of the fistula

## 2015-11-12 NOTE — Telephone Encounter (Signed)
Sched lab 7/24 at 11:30 and MD ON 7/31 AT 8:30. No vm on hm nor cell#. Mailed appt letters through regular mail to inform pt of new appts.

## 2015-11-14 DIAGNOSIS — E1129 Type 2 diabetes mellitus with other diabetic kidney complication: Secondary | ICD-10-CM | POA: Diagnosis not present

## 2015-11-14 DIAGNOSIS — N186 End stage renal disease: Secondary | ICD-10-CM | POA: Diagnosis not present

## 2015-11-14 DIAGNOSIS — D631 Anemia in chronic kidney disease: Secondary | ICD-10-CM | POA: Diagnosis not present

## 2015-11-14 DIAGNOSIS — N2581 Secondary hyperparathyroidism of renal origin: Secondary | ICD-10-CM | POA: Diagnosis not present

## 2015-11-14 DIAGNOSIS — D509 Iron deficiency anemia, unspecified: Secondary | ICD-10-CM | POA: Diagnosis not present

## 2015-11-14 DIAGNOSIS — E8779 Other fluid overload: Secondary | ICD-10-CM | POA: Diagnosis not present

## 2015-11-16 DIAGNOSIS — N2581 Secondary hyperparathyroidism of renal origin: Secondary | ICD-10-CM | POA: Diagnosis not present

## 2015-11-16 DIAGNOSIS — E8779 Other fluid overload: Secondary | ICD-10-CM | POA: Diagnosis not present

## 2015-11-16 DIAGNOSIS — D509 Iron deficiency anemia, unspecified: Secondary | ICD-10-CM | POA: Diagnosis not present

## 2015-11-16 DIAGNOSIS — E1129 Type 2 diabetes mellitus with other diabetic kidney complication: Secondary | ICD-10-CM | POA: Diagnosis not present

## 2015-11-16 DIAGNOSIS — N186 End stage renal disease: Secondary | ICD-10-CM | POA: Diagnosis not present

## 2015-11-16 DIAGNOSIS — D631 Anemia in chronic kidney disease: Secondary | ICD-10-CM | POA: Diagnosis not present

## 2015-11-19 DIAGNOSIS — D509 Iron deficiency anemia, unspecified: Secondary | ICD-10-CM | POA: Diagnosis not present

## 2015-11-19 DIAGNOSIS — N2581 Secondary hyperparathyroidism of renal origin: Secondary | ICD-10-CM | POA: Diagnosis not present

## 2015-11-19 DIAGNOSIS — D631 Anemia in chronic kidney disease: Secondary | ICD-10-CM | POA: Diagnosis not present

## 2015-11-19 DIAGNOSIS — E1129 Type 2 diabetes mellitus with other diabetic kidney complication: Secondary | ICD-10-CM | POA: Diagnosis not present

## 2015-11-19 DIAGNOSIS — N186 End stage renal disease: Secondary | ICD-10-CM | POA: Diagnosis not present

## 2015-11-19 DIAGNOSIS — E8779 Other fluid overload: Secondary | ICD-10-CM | POA: Diagnosis not present

## 2015-11-20 DIAGNOSIS — I4891 Unspecified atrial fibrillation: Secondary | ICD-10-CM | POA: Diagnosis not present

## 2015-11-20 DIAGNOSIS — I80202 Phlebitis and thrombophlebitis of unspecified deep vessels of left lower extremity: Secondary | ICD-10-CM | POA: Diagnosis not present

## 2015-11-20 DIAGNOSIS — Z7901 Long term (current) use of anticoagulants: Secondary | ICD-10-CM | POA: Diagnosis not present

## 2015-11-21 DIAGNOSIS — E1129 Type 2 diabetes mellitus with other diabetic kidney complication: Secondary | ICD-10-CM | POA: Diagnosis not present

## 2015-11-21 DIAGNOSIS — D631 Anemia in chronic kidney disease: Secondary | ICD-10-CM | POA: Diagnosis not present

## 2015-11-21 DIAGNOSIS — N186 End stage renal disease: Secondary | ICD-10-CM | POA: Diagnosis not present

## 2015-11-21 DIAGNOSIS — N2581 Secondary hyperparathyroidism of renal origin: Secondary | ICD-10-CM | POA: Diagnosis not present

## 2015-11-21 DIAGNOSIS — D509 Iron deficiency anemia, unspecified: Secondary | ICD-10-CM | POA: Diagnosis not present

## 2015-11-21 DIAGNOSIS — E8779 Other fluid overload: Secondary | ICD-10-CM | POA: Diagnosis not present

## 2015-11-22 ENCOUNTER — Encounter: Payer: Self-pay | Admitting: Radiation Oncology

## 2015-11-22 ENCOUNTER — Ambulatory Visit
Admission: RE | Admit: 2015-11-22 | Discharge: 2015-11-22 | Disposition: A | Discharge status: Home / Self Care | Payer: Medicare Other | Source: Ambulatory Visit | Attending: Radiation Oncology | Admitting: Radiation Oncology

## 2015-11-22 VITALS — BP 128/48 | HR 92 | Temp 97.6°F | Resp 18 | Wt 274.0 lb

## 2015-11-22 DIAGNOSIS — C4440 Unspecified malignant neoplasm of skin of scalp and neck: Secondary | ICD-10-CM

## 2015-11-22 DIAGNOSIS — C8510 Unspecified B-cell lymphoma, unspecified site: Secondary | ICD-10-CM | POA: Diagnosis not present

## 2015-11-22 DIAGNOSIS — C4442 Squamous cell carcinoma of skin of scalp and neck: Secondary | ICD-10-CM

## 2015-11-22 DIAGNOSIS — C490 Malignant neoplasm of connective and soft tissue of head, face and neck: Secondary | ICD-10-CM | POA: Diagnosis not present

## 2015-11-22 DIAGNOSIS — I120 Hypertensive chronic kidney disease with stage 5 chronic kidney disease or end stage renal disease: Secondary | ICD-10-CM | POA: Diagnosis not present

## 2015-11-22 DIAGNOSIS — Z992 Dependence on renal dialysis: Secondary | ICD-10-CM | POA: Diagnosis not present

## 2015-11-22 DIAGNOSIS — E1122 Type 2 diabetes mellitus with diabetic chronic kidney disease: Secondary | ICD-10-CM | POA: Diagnosis not present

## 2015-11-22 DIAGNOSIS — N186 End stage renal disease: Secondary | ICD-10-CM | POA: Diagnosis not present

## 2015-11-22 NOTE — Progress Notes (Signed)
Radiation Oncology         [phone] ________________________________  Name: [name] MRN: [id number]  Date: 11/22/2015  DOB: 12/03/1950  Follow-Up Visit Note  CC: Geoffery Lyons, MD  Wyatt Portela, MD  Diagnosis: 65 y.o. 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    ICD-9-CM ICD-10-CM   1. 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 The patient's initial consultation with radiation oncology was on 11/04/15. CT of the chest and head w/o contrast on 11/06/15 showed no evidence of thoracic metastasis and a prominent left axial lymph node similar to comparison on PET scan. The patient's case was discussed for melanoma and sarcoma conference earlier today. It is believed that this is a primary skin cancer that started in the scalp. The patient has dialysis on Tuesday, Thursday, and Saturday. The patient presents today, with his son, to discuss the role of radiation in the management of his disease. The patient and his son expressed interest in the patient receiving radiation in [place].  ROS: His scalp over the incising site is sore, but not painful.  ALLERGIES:  has No Known Allergies.  Meds: Current Outpatient Prescriptions  Medication Sig Dispense Refill  . acetaminophen (TYLENOL) 325 MG tablet Take 650 mg by mouth every 6 (six) hours as needed (pain).    [name] 120 MG 24 hr capsule Take 120 mg by mouth daily.    . insulin glargine (LANTUS) 100 UNIT/ML injection Inject 0.3 mLs (30 Units total) into the skin at bedtime. 10 mL 11  . insulin lispro (HUMALOG) 100 UNIT/ML injection Inject 10 Units into the skin 3 (three) times daily with meals.     . lidocaine-prilocaine (EMLA) cream Apply 1 application topically as needed (Apply small amount to access site 1-2 hours before dialysis. Cover  with occlusive dressing (saran wrap)).     . multivitamin (RENA-VIT) TABS tablet Take 1 tablet by mouth at bedtime. 30 tablet 0  . oxyCODONE-acetaminophen (PERCOCET/ROXICET) 5-325 MG tablet Take 1 tablet by mouth every 6 (six) hours as needed. 6 tablet 0  . polysaccharide iron (NIFEREX) 150 MG CAPS capsule Take 1 capsule (150 mg total) by mouth daily. 30 each 2  . sevelamer carbonate (RENVELA) 800 MG tablet Take 1,600 mg by mouth 3 (three) times daily with meals.    . warfarin (COUMADIN) 5 MG tablet Take 7.5 mg by mouth at bedtime.      No current facility-administered medications for this encounter.    Physical Findings: The patient is in no acute distress. Patient is alert and oriented.  weight is 274 lb (124.286 kg). His oral temperature is 97.6 F (36.4 C). His blood pressure is 128/48 and his pulse is 92. His respiration is 18 and oxygen saturation is 100%. .  No significant changes.  In general this is a well appearing African-American male in no acute distress. He's alert and oriented x4 and appropriate throughout the examination. Cardiopulmonary assessment is negative for acute distress and he exhibits normal effort. The patient is ambulatory with a cane.  HEENT: Normocephalic, atraumatic, EOMs intact. Incision over right scalp is intact, no evidence of cellulitis. Skin is well approximated and well healed.  Lab Findings: Lab Results  Component Value Date   WBC 2.6* 05/03/2015   WBC 5.2 09/29/2011   HGB  12.9* 11/08/2015   HGB 9.4* 09/29/2011   HCT 38.0* 11/08/2015   HCT 28.9* 09/29/2011   PLT 99* 05/03/2015   PLT 230 09/29/2011    Lab Results  Component Value Date   NA 133* 11/08/2015   K 4.2 11/08/2015   CO2 29 05/03/2015   GLUCOSE 100* 11/08/2015   BUN 22* 05/03/2015   CREATININE 4.94* 05/03/2015   CREATININE 4.44* 01/14/2011   BILITOT 1.6* 05/03/2015   ALKPHOS 233* 05/03/2015   AST 39 05/03/2015   ALT 26 05/03/2015   PROT 7.4 05/03/2015   ALBUMIN 3.1*  05/03/2015   CALCIUM 8.9 05/03/2015   CALCIUM 9.1 01/09/2013   ANIONGAP 11 05/03/2015    Radiographic Findings: Ct Head Wo Contrast  11/06/2015  CLINICAL DATA:  Headache and dizziness. Skin tumor removed from the scalp recently. Assess for metastatic disease. EXAM: CT HEAD WITHOUT CONTRAST TECHNIQUE: Contiguous axial images were obtained from the base of the skull through the vertex without intravenous contrast. COMPARISON:  PET-CT 03/22/2015.  Head CT 01/08/2011. FINDINGS: The brain does not show any evidence of old or acute small or large vessel infarction. No evidence of mass lesion, hemorrhage, hydrocephalus or extra-axial collection. No scalp or calvarial lesion is seen. There is atherosclerotic calcification of the major vessels at the base of the brain. IMPRESSION: Normal appearance of the brain, skull and scalp by CT. There is atherosclerotic calcification of the major vessels at the base of the brain. Electronically Signed   By: Nelson Chimes [name]   On: 11/06/2015 14:19   Ct Chest Wo Contrast  11/06/2015  CLINICAL DATA:  Non-Hodgkin's lymphoma diagnosed 2009. Recent carcinoma skin tumors removed from head. EXAM: CT CHEST WITHOUT CONTRAST TECHNIQUE: Multidetector CT imaging of the chest was performed following the standard protocol without IV contrast. COMPARISON:  03/22/2015 FINDINGS: Mediastinum/Nodes: Prominent LEFT axillary lymph nodes are again demonstrated but not pathologic enlarged. Multiple small mediastinal lymph nodes are present measuring less than 10 mm short axis. Small pericardial effusion is present. Heart is large. . Lungs/Pleura: No suspicious pulmonary nodules. Mild interlobular septal thickening at lung bases. Upper abdomen: Limited view of the liver, kidneys, pancreas are unremarkable. Normal adrenal glands. There is free fluid in the upper abdomen. IVC filter noted. Large gallstone Musculoskeletal: There is anasarca within soft tissues chest. No aggressive osseous lesion.  IMPRESSION: 1. No evidence thoracic metastasis. 2. Prominent LEFT axial lymph node similar to comparison PET-CT scan. 3. Anasarca, ascites and interstitial edema suggests volume overload. Electronically Signed   By: Suzy Bouchard [name]   On: 11/06/2015 14:28    Impression: 65 y.o. 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  Today, I talked to the patient and family about the findings and work-up thus far.  We discussed the natural history of poorly differentiated sarcomatoid tumor on the right scalp and general treatment, highlighting the role of radiotherapy in the management.  We discussed the available radiation techniques, and focused on the details of logistics and delivery.  We reviewed the anticipated acute and late sequelae associated with radiation in this setting.  The patient was encouraged to ask questions that I answered to the best of my ability. The patient would like to proceed with radiation and will be scheduled for CT simulation.  Plan: CT simulation has been scheduled in [place] on 11/29/15 8:30AM. The patient has dialysis on Tuesday, Thursday, and Saturdays. He will receive radiation to his  right scalp on Mondays, Wednesdays, and Fridays.  _____________________________________  Sheral Apley Tammi Klippel, [name]  This document serves as a record of services personally performed by Tyler Pita, MD. It was created on his behalf by Darcus Austin, a trained medical scribe. The creation of this record is based on the scribe's personal observations and the provider's statements to them. This document has been checked and approved by the attending provider.

## 2015-11-22 NOTE — Addendum Note (Signed)
Encounter addended by: Heywood Footman, RN on: 11/22/2015  3:17 PM<BR>     Documentation filed: Charges VN

## 2015-11-22 NOTE — Progress Notes (Signed)
See progress noted from physician encounter.

## 2015-11-22 NOTE — Progress Notes (Signed)
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 His PET scan back in September 2016 did not show any evidence of metastatic disease or primary source of this tumor. He is status post surgical resection and currently has possible recurrent disease.   Past/Anticipated chemotherapy by medical oncology, if any: He is status post CHOP with rituximab chemotherapy with complete response in 2010 for large cell lyphoma. He continues to be in remission.   Patient's main complaints related to symptomatic tumor(s) are: He does not report any blurry vision or double vision or neurological deficits. Does not report any nausea or vomiting.   Pain on a scale of 0-10 is: reports scalp pain 6 on a scale of 0-10. Reports occasional headaches he manages with ibuprofen.   If Spine Met(s), symptoms, if any, include:  Bowel/Bladder retention or incontinence (please describe): no  Numbness or weakness in extremities (please describe): no  Current Decadron regimen, if applicable: no Ambulatory status? Walker? Wheelchair?: ambulatory with cane  SAFETY ISSUES:  Prior radiation? no  Pacemaker/ICD? no  Possible current pregnancy? no  Is the patient on methotrexate? no  Additional Complaints / other details: 65 year old male. Accompanied by son, Legrand Como. Left and right arms are resisted due to graft. Graft exchange complete. Patient takes dialysis on Tuesday, Thursday, and Saturday at [facility]

## 2015-11-23 DIAGNOSIS — E8779 Other fluid overload: Secondary | ICD-10-CM | POA: Diagnosis not present

## 2015-11-23 DIAGNOSIS — D509 Iron deficiency anemia, unspecified: Secondary | ICD-10-CM | POA: Diagnosis not present

## 2015-11-23 DIAGNOSIS — D631 Anemia in chronic kidney disease: Secondary | ICD-10-CM | POA: Diagnosis not present

## 2015-11-23 DIAGNOSIS — N2581 Secondary hyperparathyroidism of renal origin: Secondary | ICD-10-CM | POA: Diagnosis not present

## 2015-11-23 DIAGNOSIS — E1129 Type 2 diabetes mellitus with other diabetic kidney complication: Secondary | ICD-10-CM | POA: Diagnosis not present

## 2015-11-23 DIAGNOSIS — N186 End stage renal disease: Secondary | ICD-10-CM | POA: Diagnosis not present

## 2015-11-25 DIAGNOSIS — N2581 Secondary hyperparathyroidism of renal origin: Secondary | ICD-10-CM | POA: Diagnosis not present

## 2015-11-25 DIAGNOSIS — Z992 Dependence on renal dialysis: Secondary | ICD-10-CM | POA: Diagnosis not present

## 2015-11-25 DIAGNOSIS — I871 Compression of vein: Secondary | ICD-10-CM | POA: Diagnosis not present

## 2015-11-25 DIAGNOSIS — N186 End stage renal disease: Secondary | ICD-10-CM | POA: Diagnosis not present

## 2015-11-25 DIAGNOSIS — E1129 Type 2 diabetes mellitus with other diabetic kidney complication: Secondary | ICD-10-CM | POA: Diagnosis not present

## 2015-11-25 DIAGNOSIS — E8779 Other fluid overload: Secondary | ICD-10-CM | POA: Diagnosis not present

## 2015-11-25 DIAGNOSIS — D509 Iron deficiency anemia, unspecified: Secondary | ICD-10-CM | POA: Diagnosis not present

## 2015-11-25 DIAGNOSIS — T82868D Thrombosis of vascular prosthetic devices, implants and grafts, subsequent encounter: Secondary | ICD-10-CM | POA: Diagnosis not present

## 2015-11-25 DIAGNOSIS — D631 Anemia in chronic kidney disease: Secondary | ICD-10-CM | POA: Diagnosis not present

## 2015-11-26 DIAGNOSIS — D509 Iron deficiency anemia, unspecified: Secondary | ICD-10-CM | POA: Diagnosis not present

## 2015-11-26 DIAGNOSIS — E8779 Other fluid overload: Secondary | ICD-10-CM | POA: Diagnosis not present

## 2015-11-26 DIAGNOSIS — E1129 Type 2 diabetes mellitus with other diabetic kidney complication: Secondary | ICD-10-CM | POA: Diagnosis not present

## 2015-11-26 DIAGNOSIS — N186 End stage renal disease: Secondary | ICD-10-CM | POA: Diagnosis not present

## 2015-11-26 DIAGNOSIS — D631 Anemia in chronic kidney disease: Secondary | ICD-10-CM | POA: Diagnosis not present

## 2015-11-26 DIAGNOSIS — N2581 Secondary hyperparathyroidism of renal origin: Secondary | ICD-10-CM | POA: Diagnosis not present

## 2015-11-28 ENCOUNTER — Ambulatory Visit
Admission: RE | Admit: 2015-11-28 | Discharge: 2015-11-28 | Disposition: A | Discharge status: Home / Self Care | Payer: Medicare Other | Source: Ambulatory Visit | Attending: Radiation Oncology | Admitting: Radiation Oncology

## 2015-11-28 DIAGNOSIS — D631 Anemia in chronic kidney disease: Secondary | ICD-10-CM | POA: Diagnosis not present

## 2015-11-28 DIAGNOSIS — E8779 Other fluid overload: Secondary | ICD-10-CM | POA: Diagnosis not present

## 2015-11-28 DIAGNOSIS — E1129 Type 2 diabetes mellitus with other diabetic kidney complication: Secondary | ICD-10-CM | POA: Diagnosis not present

## 2015-11-28 DIAGNOSIS — D509 Iron deficiency anemia, unspecified: Secondary | ICD-10-CM | POA: Diagnosis not present

## 2015-11-28 DIAGNOSIS — N2581 Secondary hyperparathyroidism of renal origin: Secondary | ICD-10-CM | POA: Diagnosis not present

## 2015-11-28 DIAGNOSIS — N186 End stage renal disease: Secondary | ICD-10-CM | POA: Diagnosis not present

## 2015-11-28 NOTE — Progress Notes (Signed)
  Radiation Oncology         [phone] ________________________________  Name: [name] MRN: [id number]  Date: 11/28/2015  DOB: [birth year]  Cancelled;.  Pt to be treated closer to his home. ________________________________  Sheral Apley. Tammi Klippel, [name]

## 2015-11-29 DIAGNOSIS — C4440 Unspecified malignant neoplasm of skin of scalp and neck: Secondary | ICD-10-CM | POA: Diagnosis not present

## 2015-11-29 DIAGNOSIS — Z992 Dependence on renal dialysis: Secondary | ICD-10-CM | POA: Diagnosis not present

## 2015-11-29 DIAGNOSIS — E1129 Type 2 diabetes mellitus with other diabetic kidney complication: Secondary | ICD-10-CM | POA: Diagnosis not present

## 2015-11-29 DIAGNOSIS — C434 Malignant melanoma of scalp and neck: Secondary | ICD-10-CM | POA: Diagnosis not present

## 2015-11-29 DIAGNOSIS — N186 End stage renal disease: Secondary | ICD-10-CM | POA: Diagnosis not present

## 2015-11-30 DIAGNOSIS — D631 Anemia in chronic kidney disease: Secondary | ICD-10-CM | POA: Diagnosis not present

## 2015-11-30 DIAGNOSIS — D509 Iron deficiency anemia, unspecified: Secondary | ICD-10-CM | POA: Diagnosis not present

## 2015-11-30 DIAGNOSIS — N186 End stage renal disease: Secondary | ICD-10-CM | POA: Diagnosis not present

## 2015-11-30 DIAGNOSIS — N2581 Secondary hyperparathyroidism of renal origin: Secondary | ICD-10-CM | POA: Diagnosis not present

## 2015-12-01 ENCOUNTER — Telehealth: Payer: Self-pay | Admitting: Oncology

## 2015-12-01 NOTE — Telephone Encounter (Signed)
PAL - moved from 7/7 tp 7/13. Confirmed with patient.

## 2015-12-03 DIAGNOSIS — D631 Anemia in chronic kidney disease: Secondary | ICD-10-CM | POA: Diagnosis not present

## 2015-12-03 DIAGNOSIS — Z510 Encounter for antineoplastic radiation therapy: Secondary | ICD-10-CM | POA: Diagnosis not present

## 2015-12-03 DIAGNOSIS — N2581 Secondary hyperparathyroidism of renal origin: Secondary | ICD-10-CM | POA: Diagnosis not present

## 2015-12-03 DIAGNOSIS — D509 Iron deficiency anemia, unspecified: Secondary | ICD-10-CM | POA: Diagnosis not present

## 2015-12-03 DIAGNOSIS — N186 End stage renal disease: Secondary | ICD-10-CM | POA: Diagnosis not present

## 2015-12-03 DIAGNOSIS — C4440 Unspecified malignant neoplasm of skin of scalp and neck: Secondary | ICD-10-CM | POA: Diagnosis not present

## 2015-12-04 DIAGNOSIS — C434 Malignant melanoma of scalp and neck: Secondary | ICD-10-CM | POA: Diagnosis not present

## 2015-12-04 DIAGNOSIS — C4440 Unspecified malignant neoplasm of skin of scalp and neck: Secondary | ICD-10-CM | POA: Diagnosis not present

## 2015-12-04 DIAGNOSIS — Z510 Encounter for antineoplastic radiation therapy: Secondary | ICD-10-CM | POA: Diagnosis not present

## 2015-12-05 DIAGNOSIS — D509 Iron deficiency anemia, unspecified: Secondary | ICD-10-CM | POA: Diagnosis not present

## 2015-12-05 DIAGNOSIS — N186 End stage renal disease: Secondary | ICD-10-CM | POA: Diagnosis not present

## 2015-12-05 DIAGNOSIS — N2581 Secondary hyperparathyroidism of renal origin: Secondary | ICD-10-CM | POA: Diagnosis not present

## 2015-12-05 DIAGNOSIS — D631 Anemia in chronic kidney disease: Secondary | ICD-10-CM | POA: Diagnosis not present

## 2015-12-06 ENCOUNTER — Ambulatory Visit: Payer: Medicare Other | Admitting: Oncology

## 2015-12-06 DIAGNOSIS — I4891 Unspecified atrial fibrillation: Secondary | ICD-10-CM | POA: Diagnosis not present

## 2015-12-06 DIAGNOSIS — C4440 Unspecified malignant neoplasm of skin of scalp and neck: Secondary | ICD-10-CM | POA: Diagnosis not present

## 2015-12-06 DIAGNOSIS — Z510 Encounter for antineoplastic radiation therapy: Secondary | ICD-10-CM | POA: Diagnosis not present

## 2015-12-06 DIAGNOSIS — I80202 Phlebitis and thrombophlebitis of unspecified deep vessels of left lower extremity: Secondary | ICD-10-CM | POA: Diagnosis not present

## 2015-12-06 DIAGNOSIS — Z7901 Long term (current) use of anticoagulants: Secondary | ICD-10-CM | POA: Diagnosis not present

## 2015-12-07 DIAGNOSIS — D509 Iron deficiency anemia, unspecified: Secondary | ICD-10-CM | POA: Diagnosis not present

## 2015-12-07 DIAGNOSIS — N186 End stage renal disease: Secondary | ICD-10-CM | POA: Diagnosis not present

## 2015-12-07 DIAGNOSIS — N2581 Secondary hyperparathyroidism of renal origin: Secondary | ICD-10-CM | POA: Diagnosis not present

## 2015-12-07 DIAGNOSIS — D631 Anemia in chronic kidney disease: Secondary | ICD-10-CM | POA: Diagnosis not present

## 2015-12-09 DIAGNOSIS — C4440 Unspecified malignant neoplasm of skin of scalp and neck: Secondary | ICD-10-CM | POA: Diagnosis not present

## 2015-12-09 DIAGNOSIS — Z510 Encounter for antineoplastic radiation therapy: Secondary | ICD-10-CM | POA: Diagnosis not present

## 2015-12-10 DIAGNOSIS — N186 End stage renal disease: Secondary | ICD-10-CM | POA: Diagnosis not present

## 2015-12-10 DIAGNOSIS — N2581 Secondary hyperparathyroidism of renal origin: Secondary | ICD-10-CM | POA: Diagnosis not present

## 2015-12-10 DIAGNOSIS — D631 Anemia in chronic kidney disease: Secondary | ICD-10-CM | POA: Diagnosis not present

## 2015-12-10 DIAGNOSIS — Z510 Encounter for antineoplastic radiation therapy: Secondary | ICD-10-CM | POA: Diagnosis not present

## 2015-12-10 DIAGNOSIS — D509 Iron deficiency anemia, unspecified: Secondary | ICD-10-CM | POA: Diagnosis not present

## 2015-12-10 DIAGNOSIS — C4440 Unspecified malignant neoplasm of skin of scalp and neck: Secondary | ICD-10-CM | POA: Diagnosis not present

## 2015-12-11 DIAGNOSIS — C4440 Unspecified malignant neoplasm of skin of scalp and neck: Secondary | ICD-10-CM | POA: Diagnosis not present

## 2015-12-11 DIAGNOSIS — Z510 Encounter for antineoplastic radiation therapy: Secondary | ICD-10-CM | POA: Diagnosis not present

## 2015-12-12 ENCOUNTER — Ambulatory Visit: Payer: Medicare Other | Admitting: Oncology

## 2015-12-12 DIAGNOSIS — D509 Iron deficiency anemia, unspecified: Secondary | ICD-10-CM | POA: Diagnosis not present

## 2015-12-12 DIAGNOSIS — N186 End stage renal disease: Secondary | ICD-10-CM | POA: Diagnosis not present

## 2015-12-12 DIAGNOSIS — N2581 Secondary hyperparathyroidism of renal origin: Secondary | ICD-10-CM | POA: Diagnosis not present

## 2015-12-12 DIAGNOSIS — D631 Anemia in chronic kidney disease: Secondary | ICD-10-CM | POA: Diagnosis not present

## 2015-12-13 DIAGNOSIS — C4440 Unspecified malignant neoplasm of skin of scalp and neck: Secondary | ICD-10-CM | POA: Diagnosis not present

## 2015-12-13 DIAGNOSIS — Z510 Encounter for antineoplastic radiation therapy: Secondary | ICD-10-CM | POA: Diagnosis not present

## 2015-12-14 DIAGNOSIS — N2581 Secondary hyperparathyroidism of renal origin: Secondary | ICD-10-CM | POA: Diagnosis not present

## 2015-12-14 DIAGNOSIS — N186 End stage renal disease: Secondary | ICD-10-CM | POA: Diagnosis not present

## 2015-12-14 DIAGNOSIS — D509 Iron deficiency anemia, unspecified: Secondary | ICD-10-CM | POA: Diagnosis not present

## 2015-12-14 DIAGNOSIS — D631 Anemia in chronic kidney disease: Secondary | ICD-10-CM | POA: Diagnosis not present

## 2015-12-16 DIAGNOSIS — C434 Malignant melanoma of scalp and neck: Secondary | ICD-10-CM | POA: Diagnosis not present

## 2015-12-16 DIAGNOSIS — C4440 Unspecified malignant neoplasm of skin of scalp and neck: Secondary | ICD-10-CM | POA: Diagnosis not present

## 2015-12-16 DIAGNOSIS — Z510 Encounter for antineoplastic radiation therapy: Secondary | ICD-10-CM | POA: Diagnosis not present

## 2015-12-17 DIAGNOSIS — D631 Anemia in chronic kidney disease: Secondary | ICD-10-CM | POA: Diagnosis not present

## 2015-12-17 DIAGNOSIS — N2581 Secondary hyperparathyroidism of renal origin: Secondary | ICD-10-CM | POA: Diagnosis not present

## 2015-12-17 DIAGNOSIS — N186 End stage renal disease: Secondary | ICD-10-CM | POA: Diagnosis not present

## 2015-12-17 DIAGNOSIS — D509 Iron deficiency anemia, unspecified: Secondary | ICD-10-CM | POA: Diagnosis not present

## 2015-12-17 DIAGNOSIS — Z510 Encounter for antineoplastic radiation therapy: Secondary | ICD-10-CM | POA: Diagnosis not present

## 2015-12-17 DIAGNOSIS — C4440 Unspecified malignant neoplasm of skin of scalp and neck: Secondary | ICD-10-CM | POA: Diagnosis not present

## 2015-12-18 DIAGNOSIS — Z510 Encounter for antineoplastic radiation therapy: Secondary | ICD-10-CM | POA: Diagnosis not present

## 2015-12-18 DIAGNOSIS — C4440 Unspecified malignant neoplasm of skin of scalp and neck: Secondary | ICD-10-CM | POA: Diagnosis not present

## 2015-12-19 ENCOUNTER — Encounter: Payer: Self-pay | Admitting: Surgery

## 2015-12-19 DIAGNOSIS — N186 End stage renal disease: Secondary | ICD-10-CM | POA: Diagnosis not present

## 2015-12-19 DIAGNOSIS — N2581 Secondary hyperparathyroidism of renal origin: Secondary | ICD-10-CM | POA: Diagnosis not present

## 2015-12-19 DIAGNOSIS — D509 Iron deficiency anemia, unspecified: Secondary | ICD-10-CM | POA: Diagnosis not present

## 2015-12-19 DIAGNOSIS — D631 Anemia in chronic kidney disease: Secondary | ICD-10-CM | POA: Diagnosis not present

## 2015-12-20 DIAGNOSIS — Z510 Encounter for antineoplastic radiation therapy: Secondary | ICD-10-CM | POA: Diagnosis not present

## 2015-12-20 DIAGNOSIS — C4440 Unspecified malignant neoplasm of skin of scalp and neck: Secondary | ICD-10-CM | POA: Diagnosis not present

## 2015-12-21 DIAGNOSIS — N186 End stage renal disease: Secondary | ICD-10-CM | POA: Diagnosis not present

## 2015-12-21 DIAGNOSIS — N2581 Secondary hyperparathyroidism of renal origin: Secondary | ICD-10-CM | POA: Diagnosis not present

## 2015-12-21 DIAGNOSIS — D631 Anemia in chronic kidney disease: Secondary | ICD-10-CM | POA: Diagnosis not present

## 2015-12-21 DIAGNOSIS — D509 Iron deficiency anemia, unspecified: Secondary | ICD-10-CM | POA: Diagnosis not present

## 2015-12-23 ENCOUNTER — Ambulatory Visit (HOSPITAL_COMMUNITY)
Admission: RE | Admit: 2015-12-23 | Discharge: 2015-12-23 | Disposition: A | Discharge status: Home / Self Care | Payer: Medicare Other | Source: Ambulatory Visit | Attending: Surgery | Admitting: Surgery

## 2015-12-23 DIAGNOSIS — N186 End stage renal disease: Secondary | ICD-10-CM | POA: Insufficient documentation

## 2015-12-23 DIAGNOSIS — G4730 Sleep apnea, unspecified: Secondary | ICD-10-CM | POA: Insufficient documentation

## 2015-12-23 DIAGNOSIS — E785 Hyperlipidemia, unspecified: Secondary | ICD-10-CM | POA: Diagnosis not present

## 2015-12-23 DIAGNOSIS — K219 Gastro-esophageal reflux disease without esophagitis: Secondary | ICD-10-CM | POA: Insufficient documentation

## 2015-12-23 DIAGNOSIS — E1121 Type 2 diabetes mellitus with diabetic nephropathy: Secondary | ICD-10-CM | POA: Insufficient documentation

## 2015-12-23 DIAGNOSIS — E1122 Type 2 diabetes mellitus with diabetic chronic kidney disease: Secondary | ICD-10-CM | POA: Insufficient documentation

## 2015-12-23 DIAGNOSIS — Z4931 Encounter for adequacy testing for hemodialysis: Secondary | ICD-10-CM

## 2015-12-23 DIAGNOSIS — I1311 Hypertensive heart and chronic kidney disease without heart failure, with stage 5 chronic kidney disease, or end stage renal disease: Secondary | ICD-10-CM | POA: Diagnosis not present

## 2015-12-23 DIAGNOSIS — E11319 Type 2 diabetes mellitus with unspecified diabetic retinopathy without macular edema: Secondary | ICD-10-CM | POA: Insufficient documentation

## 2015-12-24 DIAGNOSIS — N2581 Secondary hyperparathyroidism of renal origin: Secondary | ICD-10-CM | POA: Diagnosis not present

## 2015-12-24 DIAGNOSIS — D631 Anemia in chronic kidney disease: Secondary | ICD-10-CM | POA: Diagnosis not present

## 2015-12-24 DIAGNOSIS — D509 Iron deficiency anemia, unspecified: Secondary | ICD-10-CM | POA: Diagnosis not present

## 2015-12-24 DIAGNOSIS — N186 End stage renal disease: Secondary | ICD-10-CM | POA: Diagnosis not present

## 2015-12-26 DIAGNOSIS — N2581 Secondary hyperparathyroidism of renal origin: Secondary | ICD-10-CM | POA: Diagnosis not present

## 2015-12-26 DIAGNOSIS — D631 Anemia in chronic kidney disease: Secondary | ICD-10-CM | POA: Diagnosis not present

## 2015-12-26 DIAGNOSIS — D509 Iron deficiency anemia, unspecified: Secondary | ICD-10-CM | POA: Diagnosis not present

## 2015-12-26 DIAGNOSIS — N186 End stage renal disease: Secondary | ICD-10-CM | POA: Diagnosis not present

## 2015-12-27 DIAGNOSIS — I80202 Phlebitis and thrombophlebitis of unspecified deep vessels of left lower extremity: Secondary | ICD-10-CM | POA: Diagnosis not present

## 2015-12-27 DIAGNOSIS — I4891 Unspecified atrial fibrillation: Secondary | ICD-10-CM | POA: Diagnosis not present

## 2015-12-27 DIAGNOSIS — Z7901 Long term (current) use of anticoagulants: Secondary | ICD-10-CM | POA: Diagnosis not present

## 2015-12-28 DIAGNOSIS — N2581 Secondary hyperparathyroidism of renal origin: Secondary | ICD-10-CM | POA: Diagnosis not present

## 2015-12-28 DIAGNOSIS — N186 End stage renal disease: Secondary | ICD-10-CM | POA: Diagnosis not present

## 2015-12-28 DIAGNOSIS — D631 Anemia in chronic kidney disease: Secondary | ICD-10-CM | POA: Diagnosis not present

## 2015-12-28 DIAGNOSIS — D509 Iron deficiency anemia, unspecified: Secondary | ICD-10-CM | POA: Diagnosis not present

## 2015-12-30 ENCOUNTER — Ambulatory Visit (INDEPENDENT_AMBULATORY_CARE_PROVIDER_SITE_OTHER): Payer: Self-pay | Admitting: Surgery

## 2015-12-30 ENCOUNTER — Encounter: Payer: Self-pay | Admitting: Surgery

## 2015-12-30 ENCOUNTER — Other Ambulatory Visit: Payer: Self-pay

## 2015-12-30 VITALS — BP 126/79 | HR 91 | Temp 96.9°F | Resp 18 | Ht 75.5 in | Wt 265.0 lb

## 2015-12-30 DIAGNOSIS — E1129 Type 2 diabetes mellitus with other diabetic kidney complication: Secondary | ICD-10-CM | POA: Diagnosis not present

## 2015-12-30 DIAGNOSIS — N186 End stage renal disease: Secondary | ICD-10-CM | POA: Diagnosis not present

## 2015-12-30 DIAGNOSIS — Z992 Dependence on renal dialysis: Secondary | ICD-10-CM

## 2015-12-30 NOTE — Progress Notes (Signed)
   Patient name: [name] MRN: [id number] DOB: 1950/07/12 Sex: male  REASON FOR VISIT: Postop  HPI: [name] is a 65 y.o. male who is status post first stage right basilic vein transposition on 11/08/2015.  He reports no symptoms of steal or swelling in the right arm.  Current Outpatient Prescriptions  Medication Sig Dispense Refill  . acetaminophen (TYLENOL) 325 MG tablet Take 650 mg by mouth every 6 (six) hours as needed (pain).    [name] 120 MG 24 hr capsule Take 120 mg by mouth daily.    . insulin glargine (LANTUS) 100 UNIT/ML injection Inject 0.3 mLs (30 Units total) into the skin at bedtime. 10 mL 11  . insulin lispro (HUMALOG) 100 UNIT/ML injection Inject 10 Units into the skin 3 (three) times daily with meals.     . lidocaine-prilocaine (EMLA) cream Apply 1 application topically as needed (Apply small amount to access site 1-2 hours before dialysis. Cover with occlusive dressing (saran wrap)).     . multivitamin (RENA-VIT) TABS tablet Take 1 tablet by mouth at bedtime. 30 tablet 0  . oxyCODONE-acetaminophen (PERCOCET/ROXICET) 5-325 MG tablet Take 1 tablet by mouth every 6 (six) hours as needed. 6 tablet 0  . polysaccharide iron (NIFEREX) 150 MG CAPS capsule Take 1 capsule (150 mg total) by mouth daily. 30 each 2  . sevelamer carbonate (RENVELA) 800 MG tablet Take 1,600 mg by mouth 3 (three) times daily with meals.    . warfarin (COUMADIN) 5 MG tablet Take 7.5 mg by mouth at bedtime.      No current facility-administered medications for this visit.     REVIEW OF SYSTEMS:  [X]  denotes positive finding, [ ]  denotes negative finding Cardiac  Comments:  Chest pain or chest pressure:    Shortness of breath upon exertion:    Short of breath when lying flat:    Irregular heart rhythm:    Constitutional    Fever or chills:      PHYSICAL EXAM: Vitals:   12/30/15 0832  BP: 126/79  Pulse: 91  Resp: 18  Temp: (!) 96.9 F (36.1 C)    TempSrc: Oral  SpO2: 99%  Weight: 265 lb (120.2 kg)  Height: 6' 3.5" (1.918 m)    GENERAL: The patient is a well-nourished male, in no acute distress. The vital signs are documented above. CARDIOVASCULAR: There is a regular rate and rhythm. PULMONARY: There is good air exchange bilaterally without wheezing or rales. Excellent thrill within right basilic vein fistula.  Incision is well-healed  MEDICAL ISSUES: Vein mapping shows an excellent basilic vein measurements are from 0.7-0.8 mm.  The patient will be scheduled for a second stage procedure on a Wednesday or Friday in August.  [name], MD [facility] [phone] Pager [phone]

## 2015-12-31 DIAGNOSIS — D631 Anemia in chronic kidney disease: Secondary | ICD-10-CM | POA: Diagnosis not present

## 2015-12-31 DIAGNOSIS — N186 End stage renal disease: Secondary | ICD-10-CM | POA: Diagnosis not present

## 2015-12-31 DIAGNOSIS — N2581 Secondary hyperparathyroidism of renal origin: Secondary | ICD-10-CM | POA: Diagnosis not present

## 2015-12-31 DIAGNOSIS — E1129 Type 2 diabetes mellitus with other diabetic kidney complication: Secondary | ICD-10-CM | POA: Diagnosis not present

## 2016-01-02 DIAGNOSIS — N2581 Secondary hyperparathyroidism of renal origin: Secondary | ICD-10-CM | POA: Diagnosis not present

## 2016-01-02 DIAGNOSIS — D631 Anemia in chronic kidney disease: Secondary | ICD-10-CM | POA: Diagnosis not present

## 2016-01-02 DIAGNOSIS — N186 End stage renal disease: Secondary | ICD-10-CM | POA: Diagnosis not present

## 2016-01-02 DIAGNOSIS — E1129 Type 2 diabetes mellitus with other diabetic kidney complication: Secondary | ICD-10-CM | POA: Diagnosis not present

## 2016-01-04 DIAGNOSIS — N2581 Secondary hyperparathyroidism of renal origin: Secondary | ICD-10-CM | POA: Diagnosis not present

## 2016-01-04 DIAGNOSIS — E1129 Type 2 diabetes mellitus with other diabetic kidney complication: Secondary | ICD-10-CM | POA: Diagnosis not present

## 2016-01-04 DIAGNOSIS — D631 Anemia in chronic kidney disease: Secondary | ICD-10-CM | POA: Diagnosis not present

## 2016-01-04 DIAGNOSIS — N186 End stage renal disease: Secondary | ICD-10-CM | POA: Diagnosis not present

## 2016-01-07 DIAGNOSIS — N2581 Secondary hyperparathyroidism of renal origin: Secondary | ICD-10-CM | POA: Diagnosis not present

## 2016-01-07 DIAGNOSIS — N186 End stage renal disease: Secondary | ICD-10-CM | POA: Diagnosis not present

## 2016-01-07 DIAGNOSIS — D631 Anemia in chronic kidney disease: Secondary | ICD-10-CM | POA: Diagnosis not present

## 2016-01-07 DIAGNOSIS — E1129 Type 2 diabetes mellitus with other diabetic kidney complication: Secondary | ICD-10-CM | POA: Diagnosis not present

## 2016-01-08 DIAGNOSIS — I4891 Unspecified atrial fibrillation: Secondary | ICD-10-CM | POA: Diagnosis not present

## 2016-01-08 DIAGNOSIS — E1129 Type 2 diabetes mellitus with other diabetic kidney complication: Secondary | ICD-10-CM | POA: Diagnosis not present

## 2016-01-08 DIAGNOSIS — N186 End stage renal disease: Secondary | ICD-10-CM | POA: Diagnosis not present

## 2016-01-08 DIAGNOSIS — Z7901 Long term (current) use of anticoagulants: Secondary | ICD-10-CM | POA: Diagnosis not present

## 2016-01-08 DIAGNOSIS — E1140 Type 2 diabetes mellitus with diabetic neuropathy, unspecified: Secondary | ICD-10-CM | POA: Diagnosis not present

## 2016-01-08 DIAGNOSIS — E113599 Type 2 diabetes mellitus with proliferative diabetic retinopathy without macular edema, unspecified eye: Secondary | ICD-10-CM | POA: Diagnosis not present

## 2016-01-08 DIAGNOSIS — E784 Other hyperlipidemia: Secondary | ICD-10-CM | POA: Diagnosis not present

## 2016-01-08 DIAGNOSIS — I80202 Phlebitis and thrombophlebitis of unspecified deep vessels of left lower extremity: Secondary | ICD-10-CM | POA: Diagnosis not present

## 2016-01-08 DIAGNOSIS — E1139 Type 2 diabetes mellitus with other diabetic ophthalmic complication: Secondary | ICD-10-CM | POA: Diagnosis not present

## 2016-01-08 DIAGNOSIS — G629 Polyneuropathy, unspecified: Secondary | ICD-10-CM | POA: Diagnosis not present

## 2016-01-09 DIAGNOSIS — D631 Anemia in chronic kidney disease: Secondary | ICD-10-CM | POA: Diagnosis not present

## 2016-01-09 DIAGNOSIS — N2581 Secondary hyperparathyroidism of renal origin: Secondary | ICD-10-CM | POA: Diagnosis not present

## 2016-01-09 DIAGNOSIS — N186 End stage renal disease: Secondary | ICD-10-CM | POA: Diagnosis not present

## 2016-01-09 DIAGNOSIS — E1129 Type 2 diabetes mellitus with other diabetic kidney complication: Secondary | ICD-10-CM | POA: Diagnosis not present

## 2016-01-11 DIAGNOSIS — N2581 Secondary hyperparathyroidism of renal origin: Secondary | ICD-10-CM | POA: Diagnosis not present

## 2016-01-11 DIAGNOSIS — D631 Anemia in chronic kidney disease: Secondary | ICD-10-CM | POA: Diagnosis not present

## 2016-01-11 DIAGNOSIS — N186 End stage renal disease: Secondary | ICD-10-CM | POA: Diagnosis not present

## 2016-01-11 DIAGNOSIS — E1129 Type 2 diabetes mellitus with other diabetic kidney complication: Secondary | ICD-10-CM | POA: Diagnosis not present

## 2016-01-13 DIAGNOSIS — I4891 Unspecified atrial fibrillation: Secondary | ICD-10-CM | POA: Diagnosis not present

## 2016-01-13 DIAGNOSIS — N186 End stage renal disease: Secondary | ICD-10-CM | POA: Diagnosis not present

## 2016-01-13 DIAGNOSIS — Z7901 Long term (current) use of anticoagulants: Secondary | ICD-10-CM | POA: Diagnosis not present

## 2016-01-13 DIAGNOSIS — I10 Essential (primary) hypertension: Secondary | ICD-10-CM | POA: Diagnosis not present

## 2016-01-13 DIAGNOSIS — I80202 Phlebitis and thrombophlebitis of unspecified deep vessels of left lower extremity: Secondary | ICD-10-CM | POA: Diagnosis not present

## 2016-01-14 DIAGNOSIS — E1129 Type 2 diabetes mellitus with other diabetic kidney complication: Secondary | ICD-10-CM | POA: Diagnosis not present

## 2016-01-14 DIAGNOSIS — D631 Anemia in chronic kidney disease: Secondary | ICD-10-CM | POA: Diagnosis not present

## 2016-01-14 DIAGNOSIS — N186 End stage renal disease: Secondary | ICD-10-CM | POA: Diagnosis not present

## 2016-01-14 DIAGNOSIS — N2581 Secondary hyperparathyroidism of renal origin: Secondary | ICD-10-CM | POA: Diagnosis not present

## 2016-01-16 DIAGNOSIS — E1129 Type 2 diabetes mellitus with other diabetic kidney complication: Secondary | ICD-10-CM | POA: Diagnosis not present

## 2016-01-16 DIAGNOSIS — N186 End stage renal disease: Secondary | ICD-10-CM | POA: Diagnosis not present

## 2016-01-16 DIAGNOSIS — D631 Anemia in chronic kidney disease: Secondary | ICD-10-CM | POA: Diagnosis not present

## 2016-01-16 DIAGNOSIS — N2581 Secondary hyperparathyroidism of renal origin: Secondary | ICD-10-CM | POA: Diagnosis not present

## 2016-01-17 DIAGNOSIS — I80202 Phlebitis and thrombophlebitis of unspecified deep vessels of left lower extremity: Secondary | ICD-10-CM | POA: Diagnosis not present

## 2016-01-17 DIAGNOSIS — Z7901 Long term (current) use of anticoagulants: Secondary | ICD-10-CM | POA: Diagnosis not present

## 2016-01-17 DIAGNOSIS — I4891 Unspecified atrial fibrillation: Secondary | ICD-10-CM | POA: Diagnosis not present

## 2016-01-18 DIAGNOSIS — N186 End stage renal disease: Secondary | ICD-10-CM | POA: Diagnosis not present

## 2016-01-18 DIAGNOSIS — D631 Anemia in chronic kidney disease: Secondary | ICD-10-CM | POA: Diagnosis not present

## 2016-01-18 DIAGNOSIS — N2581 Secondary hyperparathyroidism of renal origin: Secondary | ICD-10-CM | POA: Diagnosis not present

## 2016-01-18 DIAGNOSIS — E1129 Type 2 diabetes mellitus with other diabetic kidney complication: Secondary | ICD-10-CM | POA: Diagnosis not present

## 2016-01-21 DIAGNOSIS — D631 Anemia in chronic kidney disease: Secondary | ICD-10-CM | POA: Diagnosis not present

## 2016-01-21 DIAGNOSIS — N186 End stage renal disease: Secondary | ICD-10-CM | POA: Diagnosis not present

## 2016-01-21 DIAGNOSIS — E1129 Type 2 diabetes mellitus with other diabetic kidney complication: Secondary | ICD-10-CM | POA: Diagnosis not present

## 2016-01-21 DIAGNOSIS — N2581 Secondary hyperparathyroidism of renal origin: Secondary | ICD-10-CM | POA: Diagnosis not present

## 2016-01-22 ENCOUNTER — Encounter (HOSPITAL_COMMUNITY): Payer: Self-pay | Admitting: *Deleted

## 2016-01-22 DIAGNOSIS — C4440 Unspecified malignant neoplasm of skin of scalp and neck: Secondary | ICD-10-CM | POA: Diagnosis not present

## 2016-01-22 DIAGNOSIS — Z923 Personal history of irradiation: Secondary | ICD-10-CM | POA: Diagnosis not present

## 2016-01-22 NOTE — Progress Notes (Signed)
Pt denies cardiac history, chest pain or sob. Pt is diabetic, states fasting blood sugar usually runs between 140-160. Instructed pt to take 1/2 of his regular dose of Lantus Thursday evening (will be taking 15 units) and do not take his Humalog insulin the day of surgery. If blood sugar is 70 or below, treat with 1/2 cup of clear juice (apple or cranberry) and recheck blood sugar 15 minutes after drinking juice. If blood sugar continues to be 70 or below, call the Short Stay department and ask to speak to a nurse. Pt voiced understanding Pt states he's recently been treated for skin cancer x 2 spots on his head. Last dose of radiation was 2 weeks ago.

## 2016-01-23 DIAGNOSIS — D631 Anemia in chronic kidney disease: Secondary | ICD-10-CM | POA: Diagnosis not present

## 2016-01-23 DIAGNOSIS — N186 End stage renal disease: Secondary | ICD-10-CM | POA: Diagnosis not present

## 2016-01-23 DIAGNOSIS — E1129 Type 2 diabetes mellitus with other diabetic kidney complication: Secondary | ICD-10-CM | POA: Diagnosis not present

## 2016-01-23 DIAGNOSIS — N2581 Secondary hyperparathyroidism of renal origin: Secondary | ICD-10-CM | POA: Diagnosis not present

## 2016-01-23 MED ORDER — DEXTROSE 5 % IV SOLN
1.5000 g | INTRAVENOUS | Status: AC
  Administered 2016-01-24: 1.5 g via INTRAVENOUS
  Filled 2016-01-23: qty 1.5

## 2016-01-24 ENCOUNTER — Ambulatory Visit (HOSPITAL_COMMUNITY): Payer: Medicare Other | Admitting: Certified Registered"

## 2016-01-24 ENCOUNTER — Ambulatory Visit (HOSPITAL_COMMUNITY)
Admission: RE | Admit: 2016-01-24 | Discharge: 2016-01-24 | Disposition: A | Discharge status: Home / Self Care | Payer: Medicare Other | Source: Ambulatory Visit | Attending: Surgery | Admitting: Surgery

## 2016-01-24 ENCOUNTER — Other Ambulatory Visit: Payer: Self-pay | Admitting: *Deleted

## 2016-01-24 ENCOUNTER — Encounter (HOSPITAL_COMMUNITY): Payer: Self-pay | Admitting: *Deleted

## 2016-01-24 ENCOUNTER — Encounter (HOSPITAL_COMMUNITY)
Admission: RE | Disposition: A | Discharge status: Home / Self Care | Payer: Self-pay | Source: Ambulatory Visit | Attending: Surgery

## 2016-01-24 DIAGNOSIS — Z79899 Other long term (current) drug therapy: Secondary | ICD-10-CM | POA: Diagnosis not present

## 2016-01-24 DIAGNOSIS — Z86718 Personal history of other venous thrombosis and embolism: Secondary | ICD-10-CM | POA: Diagnosis not present

## 2016-01-24 DIAGNOSIS — E1151 Type 2 diabetes mellitus with diabetic peripheral angiopathy without gangrene: Secondary | ICD-10-CM | POA: Insufficient documentation

## 2016-01-24 DIAGNOSIS — Z7901 Long term (current) use of anticoagulants: Secondary | ICD-10-CM | POA: Insufficient documentation

## 2016-01-24 DIAGNOSIS — N185 Chronic kidney disease, stage 5: Secondary | ICD-10-CM | POA: Insufficient documentation

## 2016-01-24 DIAGNOSIS — I120 Hypertensive chronic kidney disease with stage 5 chronic kidney disease or end stage renal disease: Secondary | ICD-10-CM | POA: Diagnosis not present

## 2016-01-24 DIAGNOSIS — D649 Anemia, unspecified: Secondary | ICD-10-CM | POA: Diagnosis not present

## 2016-01-24 DIAGNOSIS — Z794 Long term (current) use of insulin: Secondary | ICD-10-CM | POA: Diagnosis not present

## 2016-01-24 DIAGNOSIS — Z8572 Personal history of non-Hodgkin lymphomas: Secondary | ICD-10-CM | POA: Insufficient documentation

## 2016-01-24 DIAGNOSIS — E1122 Type 2 diabetes mellitus with diabetic chronic kidney disease: Secondary | ICD-10-CM | POA: Insufficient documentation

## 2016-01-24 DIAGNOSIS — N186 End stage renal disease: Secondary | ICD-10-CM

## 2016-01-24 DIAGNOSIS — Z4931 Encounter for adequacy testing for hemodialysis: Secondary | ICD-10-CM

## 2016-01-24 DIAGNOSIS — K219 Gastro-esophageal reflux disease without esophagitis: Secondary | ICD-10-CM | POA: Diagnosis not present

## 2016-01-24 HISTORY — PX: BASCILIC VEIN TRANSPOSITION: SHX5742

## 2016-01-24 LAB — PROTIME-INR
INR: 1.51
Prothrombin Time: 18.4 seconds — ABNORMAL HIGH (ref 11.4–15.2)

## 2016-01-24 LAB — POCT I-STAT 4, (NA,K, GLUC, HGB,HCT)
Glucose, Bld: 82 mg/dL (ref 65–99)
HCT: 39 % (ref 39.0–52.0)
Hemoglobin: 13.3 g/dL (ref 13.0–17.0)
Potassium: 4.3 mmol/L (ref 3.5–5.1)
Sodium: 139 mmol/L (ref 135–145)

## 2016-01-24 LAB — APTT: aPTT: 61 seconds — ABNORMAL HIGH (ref 24–36)

## 2016-01-24 LAB — GLUCOSE, CAPILLARY: Glucose-Capillary: 110 mg/dL — ABNORMAL HIGH (ref 65–99)

## 2016-01-24 SURGERY — TRANSPOSITION, VEIN, BASILIC
Anesthesia: Monitor Anesthesia Care | Site: Arm Upper | Laterality: Right

## 2016-01-24 MED ORDER — MEPERIDINE HCL 25 MG/ML IJ SOLN: 6.2500 mg | INTRAMUSCULAR | Status: DC | PRN

## 2016-01-24 MED ORDER — WARFARIN SODIUM 5 MG PO TABS: 7.5000 mg | ORAL_TABLET | Freq: Every day | ORAL | 1 refills | Status: DC

## 2016-01-24 MED ORDER — CHLORHEXIDINE GLUCONATE CLOTH 2 % EX PADS: 6.0000 | MEDICATED_PAD | Freq: Once | CUTANEOUS | Status: DC

## 2016-01-24 MED ORDER — PROTAMINE SULFATE 10 MG/ML IV SOLN
INTRAVENOUS | Status: DC | PRN
  Administered 2016-01-24: 50 mg via INTRAVENOUS

## 2016-01-24 MED ORDER — PHENYLEPHRINE HCL 10 MG/ML IJ SOLN
INTRAMUSCULAR | Status: DC | PRN
  Administered 2016-01-24: 40 ug via INTRAVENOUS
  Administered 2016-01-24: 120 ug via INTRAVENOUS
  Administered 2016-01-24: 80 ug via INTRAVENOUS
  Administered 2016-01-24: 40 ug via INTRAVENOUS
  Administered 2016-01-24: 120 ug via INTRAVENOUS
  Administered 2016-01-24: 80 ug via INTRAVENOUS
  Administered 2016-01-24: 40 ug via INTRAVENOUS

## 2016-01-24 MED ORDER — FENTANYL CITRATE (PF) 100 MCG/2ML IJ SOLN: 25.0000 ug | INTRAMUSCULAR | Status: DC | PRN

## 2016-01-24 MED ORDER — METOPROLOL TARTRATE 5 MG/5ML IV SOLN
INTRAVENOUS | Status: DC | PRN
  Administered 2016-01-24: 2 mg via INTRAVENOUS

## 2016-01-24 MED ORDER — MIDAZOLAM HCL 5 MG/5ML IJ SOLN
INTRAMUSCULAR | Status: DC | PRN
  Administered 2016-01-24: 2 mg via INTRAVENOUS

## 2016-01-24 MED ORDER — LIDOCAINE-EPINEPHRINE (PF) 1 %-1:200000 IJ SOLN
INTRAMUSCULAR | Status: AC
  Filled 2016-01-24: qty 30

## 2016-01-24 MED ORDER — ONDANSETRON HCL 4 MG/2ML IJ SOLN
INTRAMUSCULAR | Status: DC | PRN
  Administered 2016-01-24: 4 mg via INTRAVENOUS

## 2016-01-24 MED ORDER — PROMETHAZINE HCL 25 MG/ML IJ SOLN: 6.2500 mg | INTRAMUSCULAR | Status: DC | PRN

## 2016-01-24 MED ORDER — LIDOCAINE HCL (PF) 1 % IJ SOLN
INTRAMUSCULAR | Status: AC
  Filled 2016-01-24: qty 30

## 2016-01-24 MED ORDER — HEPARIN SODIUM (PORCINE) 1000 UNIT/ML IJ SOLN
INTRAMUSCULAR | Status: DC | PRN
  Administered 2016-01-24: 7000 [IU] via INTRAVENOUS

## 2016-01-24 MED ORDER — PROPOFOL 10 MG/ML IV BOLUS
INTRAVENOUS | Status: DC | PRN
  Administered 2016-01-24: 50 mg via INTRAVENOUS
  Administered 2016-01-24: 100 mg via INTRAVENOUS
  Administered 2016-01-24: 50 mg via INTRAVENOUS

## 2016-01-24 MED ORDER — SODIUM CHLORIDE 0.9 % IV SOLN
INTRAVENOUS | Status: DC
  Administered 2016-01-24 (×2): via INTRAVENOUS

## 2016-01-24 MED ORDER — VASOPRESSIN 20 UNIT/ML IV SOLN
INTRAVENOUS | Status: DC | PRN
  Administered 2016-01-24: 1 [IU] via INTRAVENOUS
  Administered 2016-01-24: 2 [IU] via INTRAVENOUS
  Administered 2016-01-24: 1 [IU] via INTRAVENOUS

## 2016-01-24 MED ORDER — PHENYLEPHRINE HCL 10 MG/ML IJ SOLN
INTRAVENOUS | Status: DC | PRN
  Administered 2016-01-24: 50 ug/min via INTRAVENOUS

## 2016-01-24 MED ORDER — OXYCODONE-ACETAMINOPHEN 5-325 MG PO TABS: 1.0000 | ORAL_TABLET | Freq: Four times a day (QID) | ORAL | 0 refills | Status: DC | PRN

## 2016-01-24 MED ORDER — LIDOCAINE HCL (CARDIAC) 20 MG/ML IV SOLN
INTRAVENOUS | Status: DC | PRN
  Administered 2016-01-24: 100 mg via INTRAVENOUS

## 2016-01-24 MED ORDER — LIDOCAINE-EPINEPHRINE (PF) 1 %-1:200000 IJ SOLN
INTRAMUSCULAR | Status: DC | PRN
  Administered 2016-01-24: 30 mL

## 2016-01-24 MED ORDER — 0.9 % SODIUM CHLORIDE (POUR BTL) OPTIME
TOPICAL | Status: DC | PRN
  Administered 2016-01-24: 1000 mL

## 2016-01-24 MED ORDER — FENTANYL CITRATE (PF) 100 MCG/2ML IJ SOLN
INTRAMUSCULAR | Status: DC | PRN
  Administered 2016-01-24: 25 ug via INTRAVENOUS
  Administered 2016-01-24: 50 ug via INTRAVENOUS
  Administered 2016-01-24 (×3): 25 ug via INTRAVENOUS

## 2016-01-24 MED ORDER — VASOPRESSIN 20 UNIT/ML IV SOLN
INTRAVENOUS | Status: AC
  Filled 2016-01-24: qty 1

## 2016-01-24 MED ORDER — DEXAMETHASONE SODIUM PHOSPHATE 10 MG/ML IJ SOLN
INTRAMUSCULAR | Status: DC | PRN
  Administered 2016-01-24: 5 mg via INTRAVENOUS

## 2016-01-24 MED ORDER — HEPARIN SODIUM (PORCINE) 5000 UNIT/ML IJ SOLN
INTRAMUSCULAR | Status: DC | PRN
  Administered 2016-01-24: 10:00:00

## 2016-01-24 SURGICAL SUPPLY — 40 items
ARMBAND PINK RESTRICT EXTREMIT (MISCELLANEOUS) ×2 IMPLANT
BANDAGE ELASTIC 4 VELCRO ST LF (GAUZE/BANDAGES/DRESSINGS) ×1 IMPLANT
CANISTER SUCTION 2500CC (MISCELLANEOUS) ×2 IMPLANT
CLIP TI MEDIUM 6 (CLIP) ×1 IMPLANT
CLIP TI WIDE RED SMALL 6 (CLIP) ×1 IMPLANT
COVER PROBE W GEL 5X96 (DRAPES) ×2 IMPLANT
ELECT REM PT RETURN 9FT ADLT (ELECTROSURGICAL) ×2
ELECTRODE REM PT RTRN 9FT ADLT (ELECTROSURGICAL) ×1 IMPLANT
GAUZE SPONGE 4X4 16PLY XRAY LF (GAUZE/BANDAGES/DRESSINGS) ×2 IMPLANT
GLOVE BIO SURGEON STRL SZ 6.5 (GLOVE) ×1 IMPLANT
GLOVE BIO SURGEON STRL SZ7.5 (GLOVE) ×1 IMPLANT
GLOVE BIOGEL PI IND STRL 6.5 (GLOVE) IMPLANT
GLOVE BIOGEL PI IND STRL 7.0 (GLOVE) IMPLANT
GLOVE BIOGEL PI IND STRL 7.5 (GLOVE) IMPLANT
GLOVE BIOGEL PI IND STRL 8 (GLOVE) IMPLANT
GLOVE BIOGEL PI INDICATOR 6.5 (GLOVE) ×1
GLOVE BIOGEL PI INDICATOR 7.0 (GLOVE) ×1
GLOVE BIOGEL PI INDICATOR 7.5 (GLOVE) ×1
GLOVE BIOGEL PI INDICATOR 8 (GLOVE) ×2
GLOVE ECLIPSE 7.0 STRL STRAW (GLOVE) ×1 IMPLANT
GLOVE SURG SS PI 7.0 STRL IVOR (GLOVE) ×2 IMPLANT
GOWN STRL REUS W/ TWL LRG LVL3 (GOWN DISPOSABLE) ×2 IMPLANT
GOWN STRL REUS W/ TWL XL LVL3 (GOWN DISPOSABLE) ×1 IMPLANT
GOWN STRL REUS W/TWL LRG LVL3 (GOWN DISPOSABLE) ×6
GOWN STRL REUS W/TWL XL LVL3 (GOWN DISPOSABLE) ×6
KIT BASIN OR (CUSTOM PROCEDURE TRAY) ×2 IMPLANT
KIT ROOM TURNOVER OR (KITS) ×2 IMPLANT
LIQUID BAND (GAUZE/BANDAGES/DRESSINGS) ×2 IMPLANT
NS IRRIG 1000ML POUR BTL (IV SOLUTION) ×2 IMPLANT
PACK CV ACCESS (CUSTOM PROCEDURE TRAY) ×2 IMPLANT
PAD ARMBOARD 7.5X6 YLW CONV (MISCELLANEOUS) ×4 IMPLANT
SUT PROLENE 6 0 BV (SUTURE) ×2 IMPLANT
SUT SILK 2 0 (SUTURE) ×2
SUT SILK 2 0 FS (SUTURE) ×1 IMPLANT
SUT SILK 2-0 18XBRD TIE 12 (SUTURE) IMPLANT
SUT VIC AB 3-0 SH 27 (SUTURE) ×6
SUT VIC AB 3-0 SH 27X BRD (SUTURE) ×1 IMPLANT
SUT VICRYL 4-0 PS2 18IN ABS (SUTURE) ×4 IMPLANT
UNDERPAD 30X30 (UNDERPADS AND DIAPERS) ×2 IMPLANT
WATER STERILE IRR 1000ML POUR (IV SOLUTION) ×2 IMPLANT

## 2016-01-24 NOTE — Transfer of Care (Signed)
Immediate Anesthesia Transfer of Care Note  Patient: [name]  Procedure(s) Performed: Procedure(s): RIGHT SECOND STAGE BASILIC VEIN TRANSPOSITION (Right)  Patient Location: PACU  Anesthesia Type:General  Level of Consciousness:  sedated, patient cooperative and responds to stimulation  Airway & Oxygen Therapy:Patient Spontanous Breathing and Patient connected to face mask oxgen  Post-op Assessment:  Report given to PACU RN and Post -op Vital signs reviewed and stable  Post vital signs:  Reviewed and stable  Last Vitals:  Vitals:   01/24/16 1053 01/24/16 1349  BP: (!) 138/95 (P) 95/62  Pulse: (!) 117 (P) 89  Resp: 16 (P) 11  Temp: 36.4 C [id number] C    Complications: No apparent anesthesia complications

## 2016-01-24 NOTE — Anesthesia Procedure Notes (Signed)
Procedure Name: LMA Insertion Date/Time: 01/24/2016 11:10 AM Performed by: Freddie Breech Pre-anesthesia Checklist: Patient identified, Emergency Drugs available, Suction available and Patient being monitored Patient Re-evaluated:Patient Re-evaluated prior to inductionOxygen Delivery Method: Circle System Utilized Preoxygenation: Pre-oxygenation with 100% oxygen Intubation Type: IV induction LMA: LMA inserted LMA Size: 5.0 Number of attempts: 1 Airway Equipment and Method: Bite block Placement Confirmation: positive ETCO2 and breath sounds checked- equal and bilateral Tube secured with: Tape Dental Injury: Teeth and Oropharynx as per pre-operative assessment

## 2016-01-24 NOTE — Interval H&P Note (Signed)
History and Physical Interval Note:  01/24/2016 10:17 AM  [name]  has presented today for surgery, with the diagnosis of End Stage Renal Disease N18.6  The various methods of treatment have been discussed with the patient and family. After consideration of risks, benefits and other options for treatment, the patient has consented to  Procedure(s): SECOND STAGE BASILIC VEIN TRANSPOSITION (Right) as a surgical intervention .  The patient's history has been reviewed, patient examined, no change in status, stable for surgery.  I have reviewed the patient's chart and labs.  Questions were answered to the patient's satisfaction.     Deitra Mayo

## 2016-01-24 NOTE — Anesthesia Preprocedure Evaluation (Signed)
Anesthesia Evaluation  Patient identified by MRN, date of birth, ID band Patient awake    Reviewed: Allergy & Precautions, H&P , NPO status , Patient's Chart, lab work & pertinent test results  History of Anesthesia Complications (+) Family history of anesthesia reaction  Airway Mallampati: II   Neck ROM: full    Dental  (+) Poor Dentition   Pulmonary shortness of breath, sleep apnea , pneumonia,    breath sounds clear to auscultation       Cardiovascular hypertension, + Peripheral Vascular Disease and + DVT   Rhythm:regular Rate:Normal     Neuro/Psych  Headaches,    GI/Hepatic GERD  ,  Endo/Other  diabetes, Type 2, Insulin Dependent  Renal/GU ESRF and DialysisRenal disease     Musculoskeletal  (+) Arthritis ,   Abdominal   Peds  Hematology  (+) anemia , Non-Hodgkin's lymphoma   Anesthesia Other Findings   Reproductive/Obstetrics                             Anesthesia Physical  Anesthesia Plan  ASA: III  Anesthesia Plan: MAC   Post-op Pain Management:    Induction: Intravenous  Airway Management Planned: Simple Face Mask  Additional Equipment:   Intra-op Plan: Utilization of Controlled Hypotension per surrgeon request  Post-operative Plan:   Informed Consent: I have reviewed the patients History and Physical, chart, labs and discussed the procedure including the risks, benefits and alternatives for the proposed anesthesia with the patient or authorized representative who has indicated his/her understanding and acceptance.     Plan Discussed with: CRNA  Anesthesia Plan Comments: (Dialysis on TTS, has done well with previous mac anesthesia )        Anesthesia Quick Evaluation

## 2016-01-24 NOTE — Op Note (Signed)
    NAME: BANX SCHACHT   MRN: [id number] DOB: 08-12-1950    DATE OF OPERATION: 01/24/2016  PREOP DIAGNOSIS: stage V chronic kidney disease  POSTOP DIAGNOSIS: same  PROCEDURE: Second stage right basilic vein transposition  SURGEON: Judeth Cornfield. Scot Dock, MD, FACS  ASSIST: Gerri Lins PA, Silva Bandy, [facility]  ANESTHESIA: Gen.   EBL: 100 cc  INDICATIONS: [name] is a 65 y.o. male who had a first stage basilic vein transposition by Dr.Brabham. He presents for a second stage basilic vein transposition.  FINDINGS: this was a brachial vein transposition.  TECHNIQUE: The patient was taken to the operating room and received a general anesthetic. The right upper extremity was prepped and draped in usual sterile fashion. It appears that the brachial vein was anastomosed to the brachial artery.  Using 3 incisions along the medial aspect of the right arm the brachial  Vein was harvested from the site of the anastomosis all the way up to the axilla. Branches were divided between clips and 3-0 silk ties. The vein was fully mobilized and marked. A tunnel was then created from the distal incision adjacent to the arterial anastomosis to the axilla. The patient was heparinized. The proximal fistula was clamped proximally and distally and then divided. It was then completely removed from the tunnel and marked to prevent twisting. It was then brought through the newly created tunnel. The vein was then sewn back into and with continuous 6-0 Prolene suture. At the completion was an excellent thrill in the fistula. Hemostasis was obtained the wounds. Each of the wounds was closed with a deep peritoneal Vicryl and the skin closed with 4-0 Vicryl. Liquiband was applied. The patient tolerated the procedure well and was transferred to the recovery room in stable condition. All needle and sponge counts were correct.  Deitra Mayo, MD, FACS [facility]  DATE OF DICTATION:    01/24/2016

## 2016-01-24 NOTE — Anesthesia Postprocedure Evaluation (Signed)
Anesthesia Post Note  Patient: [name]  Procedure(s) Performed: Procedure(s) (LRB): RIGHT SECOND STAGE BASILIC VEIN TRANSPOSITION (Right)  Patient location during evaluation: PACU Anesthesia Type: General Level of consciousness: awake and alert Pain management: pain level controlled Vital Signs Assessment: post-procedure vital signs reviewed and stable Respiratory status: spontaneous breathing, nonlabored ventilation, respiratory function stable and patient connected to nasal cannula oxygen Cardiovascular status: blood pressure returned to baseline and stable Postop Assessment: no signs of nausea or vomiting Anesthetic complications: no    Last Vitals:  Vitals:   01/24/16 1407 01/24/16 1415  BP: (!) 111/43 (!) 110/46  Pulse: 85 85  Resp: 16 16  Temp:      Last Pain:  Vitals:   01/24/16 1415  TempSrc:   PainSc: 0-No pain                 Tiajuana Amass

## 2016-01-24 NOTE — H&P (View-Only) (Signed)
   Patient name: [name] MRN: [id number] DOB: Apr 02, 1951 Sex: male  REASON FOR VISIT: Postop  HPI: [name] is a 65 y.o. male who is status post first stage right basilic vein transposition on 11/08/2015.  He reports no symptoms of steal or swelling in the right arm.  Current Outpatient Prescriptions  Medication Sig Dispense Refill  . acetaminophen (TYLENOL) 325 MG tablet Take 650 mg by mouth every 6 (six) hours as needed (pain).    [name] 120 MG 24 hr capsule Take 120 mg by mouth daily.    . insulin glargine (LANTUS) 100 UNIT/ML injection Inject 0.3 mLs (30 Units total) into the skin at bedtime. 10 mL 11  . insulin lispro (HUMALOG) 100 UNIT/ML injection Inject 10 Units into the skin 3 (three) times daily with meals.     . lidocaine-prilocaine (EMLA) cream Apply 1 application topically as needed (Apply small amount to access site 1-2 hours before dialysis. Cover with occlusive dressing (saran wrap)).     . multivitamin (RENA-VIT) TABS tablet Take 1 tablet by mouth at bedtime. 30 tablet 0  . oxyCODONE-acetaminophen (PERCOCET/ROXICET) 5-325 MG tablet Take 1 tablet by mouth every 6 (six) hours as needed. 6 tablet 0  . polysaccharide iron (NIFEREX) 150 MG CAPS capsule Take 1 capsule (150 mg total) by mouth daily. 30 each 2  . sevelamer carbonate (RENVELA) 800 MG tablet Take 1,600 mg by mouth 3 (three) times daily with meals.    . warfarin (COUMADIN) 5 MG tablet Take 7.5 mg by mouth at bedtime.      No current facility-administered medications for this visit.     REVIEW OF SYSTEMS:  [X]  denotes positive finding, [ ]  denotes negative finding Cardiac  Comments:  Chest pain or chest pressure:    Shortness of breath upon exertion:    Short of breath when lying flat:    Irregular heart rhythm:    Constitutional    Fever or chills:      PHYSICAL EXAM: Vitals:   12/30/15 0832  BP: 126/79  Pulse: 91  Resp: 18  Temp: (!) 96.9 F (36.1 C)    TempSrc: Oral  SpO2: 99%  Weight: 265 lb (120.2 kg)  Height: 6' 3.5" (1.918 m)    GENERAL: The patient is a well-nourished male, in no acute distress. The vital signs are documented above. CARDIOVASCULAR: There is a regular rate and rhythm. PULMONARY: There is good air exchange bilaterally without wheezing or rales. Excellent thrill within right basilic vein fistula.  Incision is well-healed  MEDICAL ISSUES: Vein mapping shows an excellent basilic vein measurements are from 0.7-0.8 mm.  The patient will be scheduled for a second stage procedure on a Wednesday or Friday in August.  [name], MD [facility] [phone] Pager [phone]

## 2016-01-25 ENCOUNTER — Encounter (HOSPITAL_COMMUNITY): Payer: Self-pay | Admitting: Emergency Medicine

## 2016-01-25 ENCOUNTER — Emergency Department (HOSPITAL_COMMUNITY)
Admission: EM | Admit: 2016-01-25 | Discharge: 2016-01-25 | Disposition: A | Discharge status: Home / Self Care | Payer: Medicare Other | Attending: Emergency Medicine | Admitting: Emergency Medicine

## 2016-01-25 DIAGNOSIS — Z992 Dependence on renal dialysis: Secondary | ICD-10-CM | POA: Insufficient documentation

## 2016-01-25 DIAGNOSIS — Z85828 Personal history of other malignant neoplasm of skin: Secondary | ICD-10-CM | POA: Insufficient documentation

## 2016-01-25 DIAGNOSIS — E11319 Type 2 diabetes mellitus with unspecified diabetic retinopathy without macular edema: Secondary | ICD-10-CM | POA: Diagnosis not present

## 2016-01-25 DIAGNOSIS — E1122 Type 2 diabetes mellitus with diabetic chronic kidney disease: Secondary | ICD-10-CM | POA: Insufficient documentation

## 2016-01-25 DIAGNOSIS — I97618 Postprocedural hemorrhage and hematoma of a circulatory system organ or structure following other circulatory system procedure: Secondary | ICD-10-CM | POA: Insufficient documentation

## 2016-01-25 DIAGNOSIS — N186 End stage renal disease: Secondary | ICD-10-CM | POA: Diagnosis not present

## 2016-01-25 DIAGNOSIS — E1129 Type 2 diabetes mellitus with other diabetic kidney complication: Secondary | ICD-10-CM | POA: Diagnosis not present

## 2016-01-25 DIAGNOSIS — R58 Hemorrhage, not elsewhere classified: Secondary | ICD-10-CM

## 2016-01-25 DIAGNOSIS — Z85038 Personal history of other malignant neoplasm of large intestine: Secondary | ICD-10-CM | POA: Insufficient documentation

## 2016-01-25 DIAGNOSIS — I120 Hypertensive chronic kidney disease with stage 5 chronic kidney disease or end stage renal disease: Secondary | ICD-10-CM | POA: Diagnosis not present

## 2016-01-25 DIAGNOSIS — E1121 Type 2 diabetes mellitus with diabetic nephropathy: Secondary | ICD-10-CM | POA: Diagnosis not present

## 2016-01-25 DIAGNOSIS — D631 Anemia in chronic kidney disease: Secondary | ICD-10-CM | POA: Diagnosis not present

## 2016-01-25 DIAGNOSIS — Z7901 Long term (current) use of anticoagulants: Secondary | ICD-10-CM | POA: Diagnosis not present

## 2016-01-25 DIAGNOSIS — Z794 Long term (current) use of insulin: Secondary | ICD-10-CM | POA: Diagnosis not present

## 2016-01-25 DIAGNOSIS — N2581 Secondary hyperparathyroidism of renal origin: Secondary | ICD-10-CM | POA: Diagnosis not present

## 2016-01-25 NOTE — ED Triage Notes (Signed)
Pt. Stated, I had surgery on Friday for a new graft on Friday and its never stopped bleeding.  Pt. Went to dialysis today and used his old catheter. Told to come here new graft at surgery site continues to bleed.

## 2016-01-25 NOTE — Discharge Instructions (Signed)
Follow-up with Dr. Trula Slade as instructed.   Return with worsening uncontrolled bleeding, new symptoms or other concerns.

## 2016-01-25 NOTE — ED Provider Notes (Signed)
[facility] DEPT Provider Note   CSN: [id number] Arrival date & time: 01/25/16  1133     History   Chief Complaint Chief Complaint  Patient presents with  . Vascular Access Problem  . Post-op Problem    HPI [name] is a 65 y.o. male.  Patient with history of ESRD, 2nd stage R basilic vein transposition performed by Dr. Scot Dock yesterday -- presents with persistent oozing from 3 R upper extremity incisions starting at 3:30am today. Patient went to dialysis where pressure bandage was applied. It continued to bleed through the dressing so patient sent to ED. Patient is on chronic warfarin. This was discontinued prior to procedure and patient was bridged on Lovenox. Patient has not resumed anticoagulation since completion of procedure. The onset of this condition was acute. The course is constant. Aggravating factors: none. Alleviating factors: pressure. No chest pain, shortness breath, lightheadedness or syncope.      Past Medical History:  Diagnosis Date  . Anemia   . Arthritis    HNP- lumbar, "all over my body"  . Blood transfusion    "years ago; blood was low" (08/05/2013)  . CKD (chronic kidney disease) stage 4, GFR 15-29 ml/min (HCC) 03/18/2012   [place]- T,TH,Sat.  . Diabetic nephropathy ([place])   . Diabetic retinopathy   . DVT (deep venous thrombosis) ([place])    "got one in my right leg now; I've had one before too, not sure which leg" (08/05/2013)  . ESRD (end stage renal disease) on dialysis [facility])    "just started today, (08/04/2013)"  . Family history of anesthesia complication    " my son wakes up slowly"  . GERD (gastroesophageal reflux disease)    uses alka seltzere on occas.   [name])    "one q now and then" (08/05/2013)  . Hyperlipidemia   . Hypertension   . 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 Non Hodgkin's lymphoma ([place])    Tx 2009; "had chemo;  it went away" (08/05/2013)  . Noncompliance 03/16/2012  . NSVT (nonsustained ventricular tachycardia) ([place]) 03/18/2012  . Peripheral vascular disease ([place])   . Pneumonia 2013   hosp.-   . Poor historian    pt. unsure of several answers to health history questions   . Skin cancer    melanoma - head  . Sleep apnea    "suppose to have a sleep study, but they never told me when. (08/05/2013)    Patient Active Problem List   Diagnosis Date Noted  . Skin cancer of scalp 11/22/2015  . History of colonic polyps   . Benign neoplasm of transverse colon   . Hyperglycemia 08/04/2013  . Uremia 08/04/2013  . Chronic kidney disease (CKD), stage IV (severe) ([place]) 03/21/2013  . End stage renal disease ([place]) 01/31/2013  . Chest pain with low risk for cardiac etiology 12/27/2012  . Chronic anticoagulation 12/27/2012  . 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 Vitreous hemorrhage ([place]) 04/19/2012  . CKD (chronic kidney disease) stage 4, GFR 15-29 ml/min (HCC) 03/18/2012  . NSVT (nonsustained ventricular tachycardia) ([place]) 03/18/2012  . Noncompliance 03/16/2012  . Chest pain, no MI, negative myoview, most likely muscular sketal pain 03/15/2012  . Proliferative diabetic retinopathy associated with type 2 diabetes mellitus ([place]) 08/18/2011  . 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 History of cardiac catheterization   . Non Hodgkin's lymphoma ([place])   .  Hyperlipidemia   . Diabetic retinopathy ([place])   . Diabetic nephropathy ([place])   . DVT (deep venous thrombosis) ([place])   . Cancer ([place])   . Acidosis, metabolic [id number]  . PNA (pneumonia) 01/15/2011  . Renal failure, unspecified 01/12/2011  . Hypokalemia 01/12/2011  . Anemia 01/12/2011  . Shortness of breath dyspnea 01/12/2011  . Warfarin-induced coagulopathy ([place]) 01/12/2011  . History of DVT of lower extremity 01/12/2011  . Hypertension 01/12/2011  . LYMPHOMA 10/24/2008  . DIABETES MELLITUS-TYPE II 10/24/2008  . ABDOMINAL PAIN  -GENERALIZED 10/24/2008  . PERSONAL HX COLONIC POLYPS 10/24/2008    Past Surgical History:  Procedure Laterality Date  . AV FISTULA PLACEMENT Left 02/03/2013   Procedure: ARTERIOVENOUS (AV) FISTULA CREATION- LEFT RADIAL CEPHALIC; ULTRASOUND GUIDED;  Surgeon: Mal Misty, MD;  Location[facility] OR;  Service: Vascular;  Laterality: Left;  . AV FISTULA PLACEMENT Right 11/08/2015   Procedure: RIGHT BRACHIOCEPHALIC ARTERIOVENOUS (AV) FISTULA CREATION;  Surgeon: Serafina Mitchell, MD;  Location: [facility];  Service: Vascular;  Laterality: Right;  . CARDIAC CATHETERIZATION    . COLONOSCOPY N/A 09/23/2015   Procedure: COLONOSCOPY;  Surgeon: Irene Shipper, MD;  Location: [facility];  Service: Endoscopy;  Laterality: N/A;  . Coloscopy    . EYE SURGERY Bilateral   . GAS INSERTION  05/10/2012   Procedure: INSERTION OF GAS;  Surgeon: Hayden Pedro, MD;  Location: [facility];  Service: Ophthalmology;  Laterality: Right;  . LESION EXCISION Right 05/08/2015   Procedure: EXCISION SCALP LESION;  Surgeon: Erroll Luna, MD;  Location: [facility];  Service: General;  Laterality: Right;  . MEMBRANE PEEL  05/10/2012   Procedure: MEMBRANE PEEL;  Surgeon: Hayden Pedro, MD;  Location: [facility];  Service: Ophthalmology;  Laterality: Right;  . PARS PLANA VITRECTOMY  08/27/2011   Procedure: PARS PLANA VITRECTOMY WITH 25 GAUGE;  Surgeon: Hayden Pedro, MD;  Location: [facility];  Service: Ophthalmology;  Laterality: Left;  Repair of complex traction retinal detachment left eye  . PARS PLANA VITRECTOMY  05/10/2012   Procedure: PARS PLANA VITRECTOMY WITH 25 GAUGE;  Surgeon: Hayden Pedro, MD;  Location: [facility];  Service: Ophthalmology;  Laterality: Right;  Repair Complex Traction Retinal Detachment  . PHOTOCOAGULATION WITH LASER  05/10/2012   Procedure: PHOTOCOAGULATION WITH LASER;  Surgeon: Hayden Pedro, MD;  Location: [facility];  Service: Ophthalmology;  Laterality: Right;  . PORT-A-CATH REMOVAL    . PORTACATH PLACEMENT    . VENA CAVA  FILTER PLACEMENT  09/2012   due to preparation for surgery       Home Medications    Prior to Admission medications   Medication Sig Start Date End Date Taking? Authorizing Provider  acetaminophen (TYLENOL) 325 MG tablet Take 650 mg by mouth every 6 (six) hours as needed (pain).    Historical Provider, MD  CARTIA XT 120 MG 24 hr capsule Take 120 mg by mouth daily. 03/03/15   Historical Provider, MD  insulin glargine (LANTUS) 100 UNIT/ML injection Inject 0.3 mLs (30 Units total) into the skin at bedtime. 08/07/13   Burnard Bunting, MD  insulin lispro (HUMALOG) 100 UNIT/ML injection Inject 10 Units into the skin 3 (three) times daily with meals.     Historical Provider, MD  lidocaine-prilocaine (EMLA) cream Apply 1 application topically as needed (Apply small amount to access site 1-2 hours before dialysis. Cover with occlusive dressing (saran wrap)).     Historical Provider, MD  multivitamin (RENA-VIT) TABS tablet Take 1 tablet by  mouth at bedtime. 08/08/13   Burnard Bunting, MD  oxyCODONE-acetaminophen (PERCOCET/ROXICET) 5-325 MG tablet Take 1 tablet by mouth every 6 (six) hours as needed for moderate pain. 01/24/16   Alvia Grove, PA-C  polysaccharide iron (NIFEREX) 150 MG CAPS capsule Take 1 capsule (150 mg total) by mouth daily. 01/16/11   Erline Hau, MD  sevelamer carbonate (RENVELA) 800 MG tablet Take 1,600 mg by mouth 3 (three) times daily with meals.    Historical Provider, MD  warfarin (COUMADIN) 5 MG tablet Take 1.5 tablets (7.5 mg total) by mouth at bedtime. 01/25/16   Alvia Grove, PA-C    Family History Family History  Problem Relation Age of Onset  . Anesthesia problems Son   . Hypertension Son   . Diabetes Father   . Hypertension Father   . Other Father     amputation  . Colon cancer Neg Hx     Social History Social History  Substance Use Topics  . Smoking status: Never Smoker  . Smokeless tobacco: Never Used  . Alcohol use No     Allergies     No known allergies   Review of Systems Review of Systems  Constitutional: Negative for fever.  HENT: Negative for rhinorrhea and sore throat.   Eyes: Negative for redness.  Respiratory: Negative for cough.   Cardiovascular: Negative for chest pain.  Gastrointestinal: Negative for abdominal pain, diarrhea, nausea and vomiting.  Genitourinary: Negative for dysuria.  Musculoskeletal: Negative for myalgias.  Skin: Positive for wound. Negative for rash.  Neurological: Negative for syncope, light-headedness and headaches.     Physical Exam Updated Vital Signs BP 156/57 (BP Location: Right Leg)   Pulse 96   Temp 97.5 F (36.4 C) (Oral)   Resp 18   Ht 6' 3.5" (1.918 m)   Wt 119.7 kg   SpO2 100%   BMI 32.56 kg/m   Physical Exam  Constitutional: He appears well-developed and well-nourished.  HENT:  Head: Normocephalic and atraumatic.  Eyes: Conjunctivae are normal. Right eye exhibits no discharge. Left eye exhibits no discharge.  Neck: Normal range of motion. Neck supple.  Cardiovascular: Normal rate, regular rhythm and normal heart sounds.   Pulses:      Radial pulses are 1+ on the right side, and 1+ on the left side.  Upper extremity pulses decreased but symmetric. Cap refill in UE approx 2s bilaterally.   Pulmonary/Chest: Effort normal and breath sounds normal.  Abdominal: Soft. There is no tenderness.  Neurological: He is alert.  Skin: Skin is warm and dry.  There are 3 new surgical incisions to the medial aspect of the upper arm to the axilla. Dark red blood noted to be oozing slightly from each of these wounds. Most bleeding from the distal and middle wound, less so from axillary wound. No significant surrounding tenderness or signs of cellulitis.  Psychiatric: He has a normal mood and affect.  Nursing note and vitals reviewed.    ED Treatments / Results   Procedures Procedures (including critical care time)   Initial Impression / Assessment and Plan / ED Course   I have reviewed the triage vital signs and the nursing notes.  Pertinent labs & imaging results that were available during my care of the patient were reviewed by me and considered in my medical decision making (see chart for details).  Clinical Course   Patient seen and examined. Wounds examined. Discussed with Dr. Oleta Mouse. Pressure bandage applied previously without bleed through. However will  apply Surgifoam, rebandage, and monitor.   Wound bled through surgifoam. Discussed with Dr. Trula Slade by telephone. He has seen patient and dermabonded the wound. OK for discharge per Dr. Trula Slade.   Patient encouraged to follow-up as planned. Return with worsening bleeding or other concerns.  Final Clinical Impressions(s) / ED Diagnoses   Final diagnoses:  Bleeding  Postoperative hemorrhage involving circulatory system following other circulatory system procedure   Bleeding, seen by vascular surgery, controlled, discharged home. No clinical concern for significant blood loss.  New Prescriptions New Prescriptions   No medications on file     Carlisle Cater, PA-C 01/25/16 Bristol Liu, MD 01/26/16 [phone]

## 2016-01-27 ENCOUNTER — Encounter (HOSPITAL_COMMUNITY): Payer: Self-pay | Admitting: Vascular Surgery

## 2016-01-28 ENCOUNTER — Telehealth: Payer: Self-pay | Admitting: Vascular Surgery

## 2016-01-28 DIAGNOSIS — N186 End stage renal disease: Secondary | ICD-10-CM | POA: Diagnosis not present

## 2016-01-28 DIAGNOSIS — D631 Anemia in chronic kidney disease: Secondary | ICD-10-CM | POA: Diagnosis not present

## 2016-01-28 DIAGNOSIS — E1129 Type 2 diabetes mellitus with other diabetic kidney complication: Secondary | ICD-10-CM | POA: Diagnosis not present

## 2016-01-28 DIAGNOSIS — N2581 Secondary hyperparathyroidism of renal origin: Secondary | ICD-10-CM | POA: Diagnosis not present

## 2016-01-28 NOTE — Telephone Encounter (Signed)
Sched appt 03/14/16; lab at 3:00 and MD at 3:45. Spoke to pt's son to inform them of appt.

## 2016-01-28 NOTE — Telephone Encounter (Signed)
-----   Message from Mena Goes, RN sent at 01/24/2016  1:59 PM EDT ----- Regarding: schedule   ----- Message ----- From: Alvia Grove, PA-C Sent: 01/24/2016   1:25 PM To: Vvs Charge Pool  S/p second stage brachial vein transposition 01/24/16  F/u in 6 weeks with Dr. Scot Dock and duplex.  Thanks Maudie Mercury

## 2016-01-29 ENCOUNTER — Emergency Department (HOSPITAL_COMMUNITY): Payer: Medicare Other

## 2016-01-29 ENCOUNTER — Encounter (HOSPITAL_COMMUNITY): Payer: Self-pay | Admitting: Emergency Medicine

## 2016-01-29 ENCOUNTER — Emergency Department (HOSPITAL_COMMUNITY)
Admission: EM | Admit: 2016-01-29 | Discharge: 2016-01-29 | Disposition: A | Discharge status: Home / Self Care | Payer: Medicare Other | Attending: Emergency Medicine | Admitting: Emergency Medicine

## 2016-01-29 DIAGNOSIS — S01111A Laceration without foreign body of right eyelid and periocular area, initial encounter: Secondary | ICD-10-CM | POA: Diagnosis not present

## 2016-01-29 DIAGNOSIS — Y999 Unspecified external cause status: Secondary | ICD-10-CM | POA: Insufficient documentation

## 2016-01-29 DIAGNOSIS — N186 End stage renal disease: Secondary | ICD-10-CM | POA: Insufficient documentation

## 2016-01-29 DIAGNOSIS — Z794 Long term (current) use of insulin: Secondary | ICD-10-CM | POA: Diagnosis not present

## 2016-01-29 DIAGNOSIS — E1122 Type 2 diabetes mellitus with diabetic chronic kidney disease: Secondary | ICD-10-CM | POA: Insufficient documentation

## 2016-01-29 DIAGNOSIS — S098XXA Other specified injuries of head, initial encounter: Secondary | ICD-10-CM | POA: Diagnosis not present

## 2016-01-29 DIAGNOSIS — D234 Other benign neoplasm of skin of scalp and neck: Secondary | ICD-10-CM | POA: Diagnosis not present

## 2016-01-29 DIAGNOSIS — Z85828 Personal history of other malignant neoplasm of skin: Secondary | ICD-10-CM | POA: Insufficient documentation

## 2016-01-29 DIAGNOSIS — Z7984 Long term (current) use of oral hypoglycemic drugs: Secondary | ICD-10-CM | POA: Insufficient documentation

## 2016-01-29 DIAGNOSIS — I120 Hypertensive chronic kidney disease with stage 5 chronic kidney disease or end stage renal disease: Secondary | ICD-10-CM | POA: Insufficient documentation

## 2016-01-29 DIAGNOSIS — Y92009 Unspecified place in unspecified non-institutional (private) residence as the place of occurrence of the external cause: Secondary | ICD-10-CM | POA: Diagnosis not present

## 2016-01-29 DIAGNOSIS — S0990XA Unspecified injury of head, initial encounter: Secondary | ICD-10-CM | POA: Diagnosis not present

## 2016-01-29 DIAGNOSIS — Z79899 Other long term (current) drug therapy: Secondary | ICD-10-CM | POA: Insufficient documentation

## 2016-01-29 DIAGNOSIS — Y939 Activity, unspecified: Secondary | ICD-10-CM | POA: Diagnosis not present

## 2016-01-29 DIAGNOSIS — W01198A Fall on same level from slipping, tripping and stumbling with subsequent striking against other object, initial encounter: Secondary | ICD-10-CM | POA: Diagnosis not present

## 2016-01-29 DIAGNOSIS — S0181XA Laceration without foreign body of other part of head, initial encounter: Secondary | ICD-10-CM

## 2016-01-29 LAB — PROTIME-INR
INR: 1.23
Prothrombin Time: 15.6 seconds — ABNORMAL HIGH (ref 11.4–15.2)

## 2016-01-29 MED ORDER — LIDOCAINE-EPINEPHRINE-TETRACAINE (LET) SOLUTION
3.0000 mL | Freq: Once | NASAL | Status: AC
  Administered 2016-01-29: 3 mL via TOPICAL
  Filled 2016-01-29: qty 3

## 2016-01-29 MED ORDER — TETANUS-DIPHTH-ACELL PERTUSSIS 5-2.5-18.5 LF-MCG/0.5 IM SUSP: 0.5000 mL | Freq: Once | INTRAMUSCULAR | Status: DC

## 2016-01-29 NOTE — Discharge Instructions (Signed)
Your INR today is 1.25.  Call your Dr.'s office to let them know this was checked today.  They may want to reschedule your next check for next week instead of on Friday.  Remember to also asked them about a tetanus status and whether you need this updated.

## 2016-01-29 NOTE — ED Notes (Signed)
Pressure dressing applied to skin tear. Bleeding controlled.

## 2016-01-29 NOTE — ED Provider Notes (Signed)
[facility] DEPT Provider Note   CSN: [id number] Arrival date & time: 01/29/16  0850     History   Chief Complaint Chief Complaint  Patient presents with  . Fall    HPI [name] is a 65 y.o. male presenting with laceration to his right brow line after tripping in his him this am around 5:30 hitting his head against the edge of a door.  He denies any problems with nausea, Vomiting, headache, vision changes or neck pain since the fall.  His only complaint is with a laceration which continues to bleed about his right eye.  He has no weakness or numbness in his extremities, denies dizziness or other complaints.  He is currently on Coumadin.  States it was being held secondary to right upper extremity graft revision surgery, however restarted his Coumadin 4 days ago.  He has had no treatment prior to arrival.   Fall  Pertinent negatives include no chest pain, no abdominal pain, no headaches and no shortness of breath.    Past Medical History:  Diagnosis Date  . Anemia   . Arthritis    HNP- lumbar, "all over my body"  . Blood transfusion    "years ago; blood was low" (08/05/2013)  . CKD (chronic kidney disease) stage 4, GFR 15-29 ml/min (HCC) 03/18/2012   [place]- T,TH,Sat.  . Diabetic nephropathy ([place])   . Diabetic retinopathy   . DVT (deep venous thrombosis) ([place])    "got one in my right leg now; I've had one before too, not sure which leg" (08/05/2013)  . ESRD (end stage renal disease) on dialysis [facility])    "just started today, (08/04/2013)"  . Family history of anesthesia complication    " my son wakes up slowly"  . GERD (gastroesophageal reflux disease)    uses alka seltzere on occas.   [name])    "one q now and then" (08/05/2013)  . Hyperlipidemia   . Hypertension   . 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)   . Non Hodgkin's lymphoma ([place])    Tx 2009; "had chemo; it  went away" (08/05/2013)  . Noncompliance 03/16/2012  . NSVT (nonsustained ventricular tachycardia) ([place]) 03/18/2012  . Peripheral vascular disease ([place])   . Pneumonia 2013   hosp.-   . Poor historian    pt. unsure of several answers to health history questions   . Skin cancer    melanoma - head  . Sleep apnea    "suppose to have a sleep study, but they never told me when. (08/05/2013)    Patient Active Problem List   Diagnosis Date Noted  . Skin cancer of scalp 11/22/2015  . History of colonic polyps   . Benign neoplasm of transverse colon   . Hyperglycemia 08/04/2013  . Uremia 08/04/2013  . Chronic kidney disease (CKD), stage IV (severe) ([place]) 03/21/2013  . End stage renal disease ([place]) 01/31/2013  . Chest pain with low risk for cardiac etiology 12/27/2012  . Chronic anticoagulation 12/27/2012  . 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 Vitreous hemorrhage ([place]) 04/19/2012  . CKD (chronic kidney disease) stage 4, GFR 15-29 ml/min (HCC) 03/18/2012  . NSVT (nonsustained ventricular tachycardia) ([place]) 03/18/2012  . Noncompliance 03/16/2012  . Chest pain, no MI, negative myoview, most likely muscular sketal pain 03/15/2012  . Proliferative diabetic retinopathy associated with type 2 diabetes mellitus ([place]) 08/18/2011  . Traction detachment of left  retina 08/18/2011  . History of cardiac catheterization   . Non Hodgkin's lymphoma ([place])   . Hyperlipidemia   . Diabetic retinopathy ([place])   . Diabetic nephropathy ([place])   . DVT (deep venous thrombosis) ([place])   . Cancer ([place])   . Acidosis, metabolic [id number]  . PNA (pneumonia) 01/15/2011  . Renal failure, unspecified 01/12/2011  . Hypokalemia 01/12/2011  . Anemia 01/12/2011  . Shortness of breath dyspnea 01/12/2011  . Warfarin-induced coagulopathy ([place]) 01/12/2011  . History of DVT of lower extremity 01/12/2011  . Hypertension 01/12/2011  . LYMPHOMA 10/24/2008  . DIABETES MELLITUS-TYPE II 10/24/2008  . ABDOMINAL PAIN  -GENERALIZED 10/24/2008  . PERSONAL HX COLONIC POLYPS 10/24/2008    Past Surgical History:  Procedure Laterality Date  . AV FISTULA PLACEMENT Left 02/03/2013   Procedure: ARTERIOVENOUS (AV) FISTULA CREATION- LEFT RADIAL CEPHALIC; ULTRASOUND GUIDED;  Surgeon: Mal Misty, MD;  Location[facility];  Service: Vascular;  Laterality: Left;  . AV FISTULA PLACEMENT Right 11/08/2015   Procedure: RIGHT BRACHIOCEPHALIC ARTERIOVENOUS (AV) FISTULA CREATION;  Surgeon: Serafina Mitchell, MD;  Location: [place];  Service: Vascular;  Laterality: Right;  . BASCILIC VEIN TRANSPOSITION Right 01/24/2016   Procedure: RIGHT SECOND STAGE BASILIC VEIN TRANSPOSITION;  Surgeon: Angelia Mould, MD;  Location: [place];  Service: Vascular;  Laterality: Right;  . CARDIAC CATHETERIZATION    . COLONOSCOPY N/A 09/23/2015   Procedure: COLONOSCOPY;  Surgeon: Irene Shipper, MD;  Location: [facility] ENDOSCOPY;  Service: Endoscopy;  Laterality: N/A;  . Coloscopy    . EYE SURGERY Bilateral   . GAS INSERTION  05/10/2012   Procedure: INSERTION OF GAS;  Surgeon: Hayden Pedro, MD;  Location: [place];  Service: Ophthalmology;  Laterality: Right;  . LESION EXCISION Right 05/08/2015   Procedure: EXCISION SCALP LESION;  Surgeon: Erroll Luna, MD;  Location: [place];  Service: General;  Laterality: Right;  . MEMBRANE PEEL  05/10/2012   Procedure: MEMBRANE PEEL;  Surgeon: Hayden Pedro, MD;  Location: [place];  Service: Ophthalmology;  Laterality: Right;  . PARS PLANA VITRECTOMY  08/27/2011   Procedure: PARS PLANA VITRECTOMY WITH 25 GAUGE;  Surgeon: Hayden Pedro, MD;  Location: [place];  Service: Ophthalmology;  Laterality: Left;  Repair of complex traction retinal detachment left eye  . PARS PLANA VITRECTOMY  05/10/2012   Procedure: PARS PLANA VITRECTOMY WITH 25 GAUGE;  Surgeon: Hayden Pedro, MD;  Location: [place];  Service: Ophthalmology;  Laterality: Right;  Repair Complex Traction Retinal Detachment  . PHOTOCOAGULATION WITH LASER  05/10/2012    Procedure: PHOTOCOAGULATION WITH LASER;  Surgeon: Hayden Pedro, MD;  Location: [place];  Service: Ophthalmology;  Laterality: Right;  . PORT-A-CATH REMOVAL    . PORTACATH PLACEMENT    . VENA CAVA FILTER PLACEMENT  09/2012   due to preparation for surgery       Home Medications    Prior to Admission medications   Medication Sig Start Date End Date Taking? Authorizing Provider  acetaminophen (TYLENOL) 325 MG tablet Take 650 mg by mouth every 6 (six) hours as needed (pain).   Yes Historical Provider, MD  CARTIA XT 120 MG 24 hr capsule Take 120 mg by mouth daily. 03/03/15  Yes Historical Provider, MD  insulin glargine (LANTUS) 100 UNIT/ML injection Inject 0.3 mLs (30 Units total) into the skin at bedtime. 08/07/13  Yes Burnard Bunting, MD  insulin lispro (HUMALOG) 100 UNIT/ML injection Inject 10 Units into the skin 3 (three) times daily with  meals.    Yes Historical Provider, MD  lidocaine-prilocaine (EMLA) cream Apply 1 application topically as needed (Apply small amount to access site 1-2 hours before dialysis. Cover with occlusive dressing (saran wrap)).    Yes Historical Provider, MD  multivitamin (RENA-VIT) TABS tablet Take 1 tablet by mouth at bedtime. 08/08/13  Yes Burnard Bunting, MD  oxyCODONE-acetaminophen (PERCOCET/ROXICET) 5-325 MG tablet Take 1 tablet by mouth every 6 (six) hours as needed for moderate pain. 01/24/16  Yes Alvia Grove, PA-C  polysaccharide iron (NIFEREX) 150 MG CAPS capsule Take 1 capsule (150 mg total) by mouth daily. 01/16/11  Yes Estela Leonie Green, MD  sevelamer carbonate (RENVELA) 800 MG tablet Take 1,600 mg by mouth 3 (three) times daily with meals.   Yes Historical Provider, MD  warfarin (COUMADIN) 5 MG tablet Take 1.5 tablets (7.5 mg total) by mouth at bedtime. 01/25/16  Yes Alvia Grove, PA-C    Family History Family History  Problem Relation Age of Onset  . Anesthesia problems Son   . Hypertension Son   . Diabetes Father   . Hypertension  Father   . Other Father     amputation  . Colon cancer Neg Hx     Social History Social History  Substance Use Topics  . Smoking status: Never Smoker  . Smokeless tobacco: Never Used  . Alcohol use No     Allergies   No known allergies   Review of Systems Review of Systems  Constitutional: Negative for fever.  HENT: Negative for congestion and sore throat.   Eyes: Negative.   Respiratory: Negative for chest tightness and shortness of breath.   Cardiovascular: Negative for chest pain.  Gastrointestinal: Negative for abdominal pain and nausea.  Genitourinary: Negative.   Musculoskeletal: Negative for arthralgias, joint swelling and neck pain.  Skin: Positive for wound. Negative for rash.  Neurological: Negative for dizziness, weakness, light-headedness, numbness and headaches.  Psychiatric/Behavioral: Negative.      Physical Exam Updated Vital Signs BP 131/75   Pulse 96   Temp 97.6 F (36.4 C) (Oral)   Resp 18   Ht 6' 3.5" (1.918 m)   Wt 119.7 kg   SpO2 100%   BMI 32.56 kg/m   Physical Exam  Constitutional: He appears well-developed and well-nourished.  HENT:  Head: Normocephalic and atraumatic.  Eyes: Conjunctivae and EOM are normal. Pupils are equal, round, and reactive to light.  No visible eye trauma.  Neck: Normal range of motion.  Cardiovascular: Normal rate, regular rhythm, normal heart sounds and intact distal pulses.   Pulmonary/Chest: Effort normal and breath sounds normal. He has no wheezes.  Abdominal: Soft. Bowel sounds are normal. There is no tenderness.  Musculoskeletal: Normal range of motion.  Neurological: He is alert. No cranial nerve deficit. He exhibits normal muscle tone. Coordination normal.  Skin: Skin is warm and dry.  1 cm superficial laceration right upper eyelid beneath eyebrow.  Psychiatric: He has a normal mood and affect.  Nursing note and vitals reviewed.    ED Treatments / Results  Labs (all labs ordered are listed,  but only abnormal results are displayed)  Results for orders placed or performed during the hospital encounter of 01/29/16  Protime-INR  Result Value Ref Range   Prothrombin Time 15.6 (H) 11.4 - 15.2 seconds   INR 1.23    Ct Head Wo Contrast  Result Date: 01/29/2016 CLINICAL DATA:  65 year old male who tripped and fell onto corner of door. On Coumadin. Scalp malignancy.  Initial encounter. EXAM: CT HEAD WITHOUT CONTRAST TECHNIQUE: Contiguous axial images were obtained from the base of the skull through the vertex without intravenous contrast. COMPARISON:  Head CT without contrast 11/06/2015. [facility] Radiation Oncology simulation CT 11/29/2015. FINDINGS: Brain: No midline shift, ventriculomegaly, mass effect, evidence of mass lesion, intracranial hemorrhage or evidence of cortically based acute infarction. Gray-white matter differentiation is within normal limits throughout the brain. Vascular: Calcified atherosclerosis at the skull base. Skull: Stable bone mineralization. No acute osseous abnormality identified. Sinuses/Orbits: Visualized paranasal sinuses and mastoids are stable and well pneumatized. Other: No scalp hematoma identified. Orbits soft tissues appear stable. There is calcified atherosclerosis of some scalp vasculature. IMPRESSION: Stable and negative non contrast appearance of the brain. No acute traumatic injury identified. Electronically Signed   By: Genevie Ann [name]   On: 01/29/2016 10:41     EKG  EKG Interpretation None       Radiology Ct Head Wo Contrast  Result Date: 01/29/2016 CLINICAL DATA:  65 year old male who tripped and fell onto corner of door. On Coumadin. Scalp malignancy. Initial encounter. EXAM: CT HEAD WITHOUT CONTRAST TECHNIQUE: Contiguous axial images were obtained from the base of the skull through the vertex without intravenous contrast. COMPARISON:  Head CT without contrast 11/06/2015. [facility] Radiation Oncology simulation  CT 11/29/2015. FINDINGS: Brain: No midline shift, ventriculomegaly, mass effect, evidence of mass lesion, intracranial hemorrhage or evidence of cortically based acute infarction. Gray-white matter differentiation is within normal limits throughout the brain. Vascular: Calcified atherosclerosis at the skull base. Skull: Stable bone mineralization. No acute osseous abnormality identified. Sinuses/Orbits: Visualized paranasal sinuses and mastoids are stable and well pneumatized. Other: No scalp hematoma identified. Orbits soft tissues appear stable. There is calcified atherosclerosis of some scalp vasculature. IMPRESSION: Stable and negative non contrast appearance of the brain. No acute traumatic injury identified. Electronically Signed   By: Genevie Ann [name]   On: 01/29/2016 10:41    Procedures Procedures (including critical care time)   LACERATION REPAIR Performed by: Evalee Jefferson Authorized by: Evalee Jefferson Consent: Verbal consent obtained. Risks and benefits: risks, benefits and alternatives were discussed Consent given by: patient Patient identity confirmed: provided demographic data Prepped and Draped in normal sterile fashion Wound explored  Laceration Location: right brow  Laceration Length: 1cm  No Foreign Bodies seen or palpated  Anesthesia: topical LET Local anesthetic: topical LET Anesthetic total: 3 ml  Irrigation method: syringe Amount of cleaning: standard  Skin closure: dermabond  Number of sutures: na  Technique: dermabond  Patient tolerance: Patient tolerated the procedure well with no immediate complications.   Medications Ordered in ED Medications  lidocaine-EPINEPHrine-tetracaine (LET) solution (3 mLs Topical Given 01/29/16 1029)     Initial Impression / Assessment and Plan / ED Course  I have reviewed the triage vital signs and the nursing notes.  Pertinent labs & imaging results that were available during my care of the patient were reviewed by me and  considered in my medical decision making (see chart for details).  Clinical Course    INR results given patient to pass on to his PCP.  He states he is scheduled for an INR check in 2 days but will call his PCP who may want to reschedule for next week.  Also advised patient to inquire about his tetanus status as he believes he is up-to-date although I can find no indication of this on his chart.  He understands to ask his primary about his tetanus status and he  defers having this updated today.  When necessary follow-up.  Anticipated.   Final Clinical Impressions(s) / ED Diagnoses   Final diagnoses:  Minor head injury, initial encounter  Facial laceration, initial encounter    New Prescriptions New Prescriptions   No medications on file     Evalee Jefferson, PA-C 01/29/16 [place], MD 01/30/16 [phone]

## 2016-01-29 NOTE — ED Triage Notes (Addendum)
Pt reports tripped and fell this am at home. Pt denies loc. Pt alert and oriented. Pt reports is on coumadin. nad noted. Minimal bleeding noted at time of arrival. Laceration noted to right eye brow.

## 2016-01-30 DIAGNOSIS — Z992 Dependence on renal dialysis: Secondary | ICD-10-CM | POA: Diagnosis not present

## 2016-01-30 DIAGNOSIS — N186 End stage renal disease: Secondary | ICD-10-CM | POA: Diagnosis not present

## 2016-01-30 DIAGNOSIS — E1129 Type 2 diabetes mellitus with other diabetic kidney complication: Secondary | ICD-10-CM | POA: Diagnosis not present

## 2016-01-30 DIAGNOSIS — N2581 Secondary hyperparathyroidism of renal origin: Secondary | ICD-10-CM | POA: Diagnosis not present

## 2016-01-30 DIAGNOSIS — D631 Anemia in chronic kidney disease: Secondary | ICD-10-CM | POA: Diagnosis not present

## 2016-02-01 DIAGNOSIS — N186 End stage renal disease: Secondary | ICD-10-CM | POA: Diagnosis not present

## 2016-02-01 DIAGNOSIS — D631 Anemia in chronic kidney disease: Secondary | ICD-10-CM | POA: Diagnosis not present

## 2016-02-01 DIAGNOSIS — N2581 Secondary hyperparathyroidism of renal origin: Secondary | ICD-10-CM | POA: Diagnosis not present

## 2016-02-04 DIAGNOSIS — D631 Anemia in chronic kidney disease: Secondary | ICD-10-CM | POA: Diagnosis not present

## 2016-02-04 DIAGNOSIS — N186 End stage renal disease: Secondary | ICD-10-CM | POA: Diagnosis not present

## 2016-02-04 DIAGNOSIS — N2581 Secondary hyperparathyroidism of renal origin: Secondary | ICD-10-CM | POA: Diagnosis not present

## 2016-02-06 DIAGNOSIS — N2581 Secondary hyperparathyroidism of renal origin: Secondary | ICD-10-CM | POA: Diagnosis not present

## 2016-02-06 DIAGNOSIS — D631 Anemia in chronic kidney disease: Secondary | ICD-10-CM | POA: Diagnosis not present

## 2016-02-06 DIAGNOSIS — N186 End stage renal disease: Secondary | ICD-10-CM | POA: Diagnosis not present

## 2016-02-07 DIAGNOSIS — I4891 Unspecified atrial fibrillation: Secondary | ICD-10-CM | POA: Diagnosis not present

## 2016-02-07 DIAGNOSIS — I80202 Phlebitis and thrombophlebitis of unspecified deep vessels of left lower extremity: Secondary | ICD-10-CM | POA: Diagnosis not present

## 2016-02-07 DIAGNOSIS — C8590 Non-Hodgkin lymphoma, unspecified, unspecified site: Secondary | ICD-10-CM | POA: Diagnosis not present

## 2016-02-07 DIAGNOSIS — Z7901 Long term (current) use of anticoagulants: Secondary | ICD-10-CM | POA: Diagnosis not present

## 2016-02-08 DIAGNOSIS — D631 Anemia in chronic kidney disease: Secondary | ICD-10-CM | POA: Diagnosis not present

## 2016-02-08 DIAGNOSIS — N2581 Secondary hyperparathyroidism of renal origin: Secondary | ICD-10-CM | POA: Diagnosis not present

## 2016-02-08 DIAGNOSIS — N186 End stage renal disease: Secondary | ICD-10-CM | POA: Diagnosis not present

## 2016-02-11 DIAGNOSIS — D631 Anemia in chronic kidney disease: Secondary | ICD-10-CM | POA: Diagnosis not present

## 2016-02-11 DIAGNOSIS — N186 End stage renal disease: Secondary | ICD-10-CM | POA: Diagnosis not present

## 2016-02-11 DIAGNOSIS — N2581 Secondary hyperparathyroidism of renal origin: Secondary | ICD-10-CM | POA: Diagnosis not present

## 2016-02-12 ENCOUNTER — Ambulatory Visit (INDEPENDENT_AMBULATORY_CARE_PROVIDER_SITE_OTHER): Payer: Medicare Other

## 2016-02-12 ENCOUNTER — Ambulatory Visit (INDEPENDENT_AMBULATORY_CARE_PROVIDER_SITE_OTHER): Payer: Medicare Other | Admitting: Podiatry

## 2016-02-12 ENCOUNTER — Encounter: Payer: Self-pay | Admitting: Podiatry

## 2016-02-12 VITALS — BP 106/60 | HR 94 | Resp 16

## 2016-02-12 DIAGNOSIS — M79671 Pain in right foot: Secondary | ICD-10-CM

## 2016-02-12 DIAGNOSIS — L97401 Non-pressure chronic ulcer of unspecified heel and midfoot limited to breakdown of skin: Secondary | ICD-10-CM

## 2016-02-12 DIAGNOSIS — L89891 Pressure ulcer of other site, stage 1: Secondary | ICD-10-CM

## 2016-02-12 MED ORDER — CEPHALEXIN 500 MG PO CAPS: 500.0000 mg | ORAL_CAPSULE | Freq: Three times a day (TID) | ORAL | 1 refills | Status: DC

## 2016-02-12 NOTE — Progress Notes (Signed)
Subjective:     Patient ID: [name], male   DOB: 06-23-1950, 65 y.o.   MRN: [id number]  HPI patient states that he did something to his right foot but is not even sure how it happened   Review of Systems     Objective:   Physical Exam Neurovascular status intact with superficial sloughing of the medial side of the right hallux and right first metatarsal localized in nature with no proximal edema erythema or drainage noted    Assessment:     Probable trauma to the right foot with long-term history of diabetes vascular disease which is followed by his physicians and kidney disease    Plan:     H&P x-rays reviewed and carefully debrided tissue on the right medial foot. I then went ahead and applied Silvadene to the area sterile dressing and advised on leaving this on several days and up applied surgical shoe. He'll be seen back to recheck and if any issues are occurring is to let us know immediately  X-ray indicates dorsalis pedis calcification with no indications of other pathology

## 2016-02-13 DIAGNOSIS — D631 Anemia in chronic kidney disease: Secondary | ICD-10-CM | POA: Diagnosis not present

## 2016-02-13 DIAGNOSIS — N186 End stage renal disease: Secondary | ICD-10-CM | POA: Diagnosis not present

## 2016-02-13 DIAGNOSIS — N2581 Secondary hyperparathyroidism of renal origin: Secondary | ICD-10-CM | POA: Diagnosis not present

## 2016-02-15 DIAGNOSIS — N186 End stage renal disease: Secondary | ICD-10-CM | POA: Diagnosis not present

## 2016-02-15 DIAGNOSIS — N2581 Secondary hyperparathyroidism of renal origin: Secondary | ICD-10-CM | POA: Diagnosis not present

## 2016-02-15 DIAGNOSIS — D631 Anemia in chronic kidney disease: Secondary | ICD-10-CM | POA: Diagnosis not present

## 2016-02-18 DIAGNOSIS — D631 Anemia in chronic kidney disease: Secondary | ICD-10-CM | POA: Diagnosis not present

## 2016-02-18 DIAGNOSIS — N2581 Secondary hyperparathyroidism of renal origin: Secondary | ICD-10-CM | POA: Diagnosis not present

## 2016-02-18 DIAGNOSIS — N186 End stage renal disease: Secondary | ICD-10-CM | POA: Diagnosis not present

## 2016-02-19 ENCOUNTER — Ambulatory Visit (INDEPENDENT_AMBULATORY_CARE_PROVIDER_SITE_OTHER): Payer: Medicare Other | Admitting: Ophthalmology

## 2016-02-19 DIAGNOSIS — E113593 Type 2 diabetes mellitus with proliferative diabetic retinopathy without macular edema, bilateral: Secondary | ICD-10-CM

## 2016-02-19 DIAGNOSIS — E11319 Type 2 diabetes mellitus with unspecified diabetic retinopathy without macular edema: Secondary | ICD-10-CM | POA: Diagnosis not present

## 2016-02-19 DIAGNOSIS — I10 Essential (primary) hypertension: Secondary | ICD-10-CM

## 2016-02-19 DIAGNOSIS — H35033 Hypertensive retinopathy, bilateral: Secondary | ICD-10-CM

## 2016-02-20 ENCOUNTER — Ambulatory Visit (INDEPENDENT_AMBULATORY_CARE_PROVIDER_SITE_OTHER): Payer: Medicare Other | Admitting: Ophthalmology

## 2016-02-20 DIAGNOSIS — N2581 Secondary hyperparathyroidism of renal origin: Secondary | ICD-10-CM | POA: Diagnosis not present

## 2016-02-20 DIAGNOSIS — D631 Anemia in chronic kidney disease: Secondary | ICD-10-CM | POA: Diagnosis not present

## 2016-02-20 DIAGNOSIS — N186 End stage renal disease: Secondary | ICD-10-CM | POA: Diagnosis not present

## 2016-02-22 DIAGNOSIS — N2581 Secondary hyperparathyroidism of renal origin: Secondary | ICD-10-CM | POA: Diagnosis not present

## 2016-02-22 DIAGNOSIS — N186 End stage renal disease: Secondary | ICD-10-CM | POA: Diagnosis not present

## 2016-02-22 DIAGNOSIS — D631 Anemia in chronic kidney disease: Secondary | ICD-10-CM | POA: Diagnosis not present

## 2016-02-24 DIAGNOSIS — Z7901 Long term (current) use of anticoagulants: Secondary | ICD-10-CM | POA: Diagnosis not present

## 2016-02-24 DIAGNOSIS — I4891 Unspecified atrial fibrillation: Secondary | ICD-10-CM | POA: Diagnosis not present

## 2016-02-24 DIAGNOSIS — I80202 Phlebitis and thrombophlebitis of unspecified deep vessels of left lower extremity: Secondary | ICD-10-CM | POA: Diagnosis not present

## 2016-02-24 DIAGNOSIS — Z23 Encounter for immunization: Secondary | ICD-10-CM | POA: Diagnosis not present

## 2016-02-25 DIAGNOSIS — N2581 Secondary hyperparathyroidism of renal origin: Secondary | ICD-10-CM | POA: Diagnosis not present

## 2016-02-25 DIAGNOSIS — D631 Anemia in chronic kidney disease: Secondary | ICD-10-CM | POA: Diagnosis not present

## 2016-02-25 DIAGNOSIS — N186 End stage renal disease: Secondary | ICD-10-CM | POA: Diagnosis not present

## 2016-02-26 ENCOUNTER — Encounter: Payer: Self-pay | Admitting: Vascular Surgery

## 2016-02-27 DIAGNOSIS — N2581 Secondary hyperparathyroidism of renal origin: Secondary | ICD-10-CM | POA: Diagnosis not present

## 2016-02-27 DIAGNOSIS — D631 Anemia in chronic kidney disease: Secondary | ICD-10-CM | POA: Diagnosis not present

## 2016-02-27 DIAGNOSIS — N186 End stage renal disease: Secondary | ICD-10-CM | POA: Diagnosis not present

## 2016-02-29 DIAGNOSIS — Z992 Dependence on renal dialysis: Secondary | ICD-10-CM | POA: Diagnosis not present

## 2016-02-29 DIAGNOSIS — D631 Anemia in chronic kidney disease: Secondary | ICD-10-CM | POA: Diagnosis not present

## 2016-02-29 DIAGNOSIS — N2581 Secondary hyperparathyroidism of renal origin: Secondary | ICD-10-CM | POA: Diagnosis not present

## 2016-02-29 DIAGNOSIS — E1129 Type 2 diabetes mellitus with other diabetic kidney complication: Secondary | ICD-10-CM | POA: Diagnosis not present

## 2016-02-29 DIAGNOSIS — N186 End stage renal disease: Secondary | ICD-10-CM | POA: Diagnosis not present

## 2016-03-03 DIAGNOSIS — D631 Anemia in chronic kidney disease: Secondary | ICD-10-CM | POA: Diagnosis not present

## 2016-03-03 DIAGNOSIS — N2581 Secondary hyperparathyroidism of renal origin: Secondary | ICD-10-CM | POA: Diagnosis not present

## 2016-03-03 DIAGNOSIS — N186 End stage renal disease: Secondary | ICD-10-CM | POA: Diagnosis not present

## 2016-03-04 ENCOUNTER — Encounter: Payer: Medicare Other | Admitting: Vascular Surgery

## 2016-03-04 ENCOUNTER — Ambulatory Visit (HOSPITAL_COMMUNITY): Admit: 2016-03-04 | Payer: Medicare Other

## 2016-03-05 ENCOUNTER — Emergency Department (HOSPITAL_COMMUNITY): Payer: Medicare Other

## 2016-03-05 ENCOUNTER — Inpatient Hospital Stay (HOSPITAL_COMMUNITY)
Admission: EM | Admit: 2016-03-05 | Discharge: 2016-03-17 | DRG: 853 | Disposition: A | Discharge status: Skilled Nursing Facility | Payer: Medicare Other | Attending: Internal Medicine | Admitting: Internal Medicine

## 2016-03-05 ENCOUNTER — Inpatient Hospital Stay (HOSPITAL_COMMUNITY): Payer: Medicare Other

## 2016-03-05 ENCOUNTER — Encounter (HOSPITAL_COMMUNITY): Payer: Self-pay | Admitting: Emergency Medicine

## 2016-03-05 DIAGNOSIS — D631 Anemia in chronic kidney disease: Secondary | ICD-10-CM | POA: Diagnosis present

## 2016-03-05 DIAGNOSIS — E785 Hyperlipidemia, unspecified: Secondary | ICD-10-CM | POA: Diagnosis present

## 2016-03-05 DIAGNOSIS — Z85820 Personal history of malignant melanoma of skin: Secondary | ICD-10-CM | POA: Diagnosis not present

## 2016-03-05 DIAGNOSIS — G4730 Sleep apnea, unspecified: Secondary | ICD-10-CM | POA: Diagnosis present

## 2016-03-05 DIAGNOSIS — I120 Hypertensive chronic kidney disease with stage 5 chronic kidney disease or end stage renal disease: Secondary | ICD-10-CM | POA: Diagnosis present

## 2016-03-05 DIAGNOSIS — C8590 Non-Hodgkin lymphoma, unspecified, unspecified site: Secondary | ICD-10-CM | POA: Diagnosis present

## 2016-03-05 DIAGNOSIS — I10 Essential (primary) hypertension: Secondary | ICD-10-CM | POA: Diagnosis present

## 2016-03-05 DIAGNOSIS — L97509 Non-pressure chronic ulcer of other part of unspecified foot with unspecified severity: Secondary | ICD-10-CM | POA: Diagnosis not present

## 2016-03-05 DIAGNOSIS — L03115 Cellulitis of right lower limb: Secondary | ICD-10-CM

## 2016-03-05 DIAGNOSIS — Z794 Long term (current) use of insulin: Secondary | ICD-10-CM

## 2016-03-05 DIAGNOSIS — L089 Local infection of the skin and subcutaneous tissue, unspecified: Secondary | ICD-10-CM | POA: Diagnosis not present

## 2016-03-05 DIAGNOSIS — E119 Type 2 diabetes mellitus without complications: Secondary | ICD-10-CM

## 2016-03-05 DIAGNOSIS — Z6831 Body mass index (BMI) 31.0-31.9, adult: Secondary | ICD-10-CM

## 2016-03-05 DIAGNOSIS — N189 Chronic kidney disease, unspecified: Secondary | ICD-10-CM

## 2016-03-05 DIAGNOSIS — E113599 Type 2 diabetes mellitus with proliferative diabetic retinopathy without macular edema, unspecified eye: Secondary | ICD-10-CM | POA: Diagnosis present

## 2016-03-05 DIAGNOSIS — Z89421 Acquired absence of other right toe(s): Secondary | ICD-10-CM | POA: Diagnosis not present

## 2016-03-05 DIAGNOSIS — E11628 Type 2 diabetes mellitus with other skin complications: Secondary | ICD-10-CM | POA: Diagnosis not present

## 2016-03-05 DIAGNOSIS — R6520 Severe sepsis without septic shock: Secondary | ICD-10-CM | POA: Diagnosis present

## 2016-03-05 DIAGNOSIS — Z86718 Personal history of other venous thrombosis and embolism: Secondary | ICD-10-CM | POA: Diagnosis not present

## 2016-03-05 DIAGNOSIS — L03031 Cellulitis of right toe: Secondary | ICD-10-CM

## 2016-03-05 DIAGNOSIS — L0390 Cellulitis, unspecified: Secondary | ICD-10-CM | POA: Diagnosis not present

## 2016-03-05 DIAGNOSIS — E1152 Type 2 diabetes mellitus with diabetic peripheral angiopathy with gangrene: Secondary | ICD-10-CM | POA: Diagnosis not present

## 2016-03-05 DIAGNOSIS — E11649 Type 2 diabetes mellitus with hypoglycemia without coma: Secondary | ICD-10-CM | POA: Diagnosis present

## 2016-03-05 DIAGNOSIS — N2581 Secondary hyperparathyroidism of renal origin: Secondary | ICD-10-CM | POA: Diagnosis not present

## 2016-03-05 DIAGNOSIS — L97519 Non-pressure chronic ulcer of other part of right foot with unspecified severity: Secondary | ICD-10-CM | POA: Diagnosis present

## 2016-03-05 DIAGNOSIS — I739 Peripheral vascular disease, unspecified: Secondary | ICD-10-CM | POA: Diagnosis not present

## 2016-03-05 DIAGNOSIS — Z992 Dependence on renal dialysis: Secondary | ICD-10-CM | POA: Diagnosis not present

## 2016-03-05 DIAGNOSIS — E669 Obesity, unspecified: Secondary | ICD-10-CM | POA: Diagnosis present

## 2016-03-05 DIAGNOSIS — Z9889 Other specified postprocedural states: Secondary | ICD-10-CM | POA: Diagnosis not present

## 2016-03-05 DIAGNOSIS — S91301D Unspecified open wound, right foot, subsequent encounter: Secondary | ICD-10-CM

## 2016-03-05 DIAGNOSIS — S98139A Complete traumatic amputation of one unspecified lesser toe, initial encounter: Secondary | ICD-10-CM | POA: Diagnosis not present

## 2016-03-05 DIAGNOSIS — N186 End stage renal disease: Secondary | ICD-10-CM | POA: Diagnosis not present

## 2016-03-05 DIAGNOSIS — I700 Atherosclerosis of aorta: Secondary | ICD-10-CM | POA: Insufficient documentation

## 2016-03-05 DIAGNOSIS — E11621 Type 2 diabetes mellitus with foot ulcer: Secondary | ICD-10-CM | POA: Diagnosis present

## 2016-03-05 DIAGNOSIS — K219 Gastro-esophageal reflux disease without esophagitis: Secondary | ICD-10-CM | POA: Diagnosis not present

## 2016-03-05 DIAGNOSIS — E1122 Type 2 diabetes mellitus with diabetic chronic kidney disease: Secondary | ICD-10-CM | POA: Diagnosis not present

## 2016-03-05 DIAGNOSIS — R600 Localized edema: Secondary | ICD-10-CM | POA: Diagnosis not present

## 2016-03-05 DIAGNOSIS — Z452 Encounter for adjustment and management of vascular access device: Secondary | ICD-10-CM | POA: Diagnosis not present

## 2016-03-05 DIAGNOSIS — I82409 Acute embolism and thrombosis of unspecified deep veins of unspecified lower extremity: Secondary | ICD-10-CM

## 2016-03-05 DIAGNOSIS — E1129 Type 2 diabetes mellitus with other diabetic kidney complication: Secondary | ICD-10-CM | POA: Diagnosis not present

## 2016-03-05 DIAGNOSIS — R000 Tachycardia, unspecified: Secondary | ICD-10-CM | POA: Diagnosis not present

## 2016-03-05 DIAGNOSIS — R2681 Unsteadiness on feet: Secondary | ICD-10-CM | POA: Diagnosis not present

## 2016-03-05 DIAGNOSIS — Z79899 Other long term (current) drug therapy: Secondary | ICD-10-CM | POA: Diagnosis not present

## 2016-03-05 DIAGNOSIS — A419 Sepsis, unspecified organism: Secondary | ICD-10-CM | POA: Diagnosis not present

## 2016-03-05 DIAGNOSIS — M6281 Muscle weakness (generalized): Secondary | ICD-10-CM | POA: Diagnosis not present

## 2016-03-05 DIAGNOSIS — I96 Gangrene, not elsewhere classified: Secondary | ICD-10-CM | POA: Diagnosis not present

## 2016-03-05 DIAGNOSIS — I959 Hypotension, unspecified: Secondary | ICD-10-CM | POA: Diagnosis not present

## 2016-03-05 DIAGNOSIS — I743 Embolism and thrombosis of arteries of the lower extremities: Secondary | ICD-10-CM | POA: Diagnosis not present

## 2016-03-05 DIAGNOSIS — R2689 Other abnormalities of gait and mobility: Secondary | ICD-10-CM | POA: Diagnosis not present

## 2016-03-05 DIAGNOSIS — Z7901 Long term (current) use of anticoagulants: Secondary | ICD-10-CM

## 2016-03-05 DIAGNOSIS — I872 Venous insufficiency (chronic) (peripheral): Secondary | ICD-10-CM | POA: Diagnosis present

## 2016-03-05 DIAGNOSIS — R509 Fever, unspecified: Secondary | ICD-10-CM | POA: Diagnosis not present

## 2016-03-05 DIAGNOSIS — N184 Chronic kidney disease, stage 4 (severe): Secondary | ICD-10-CM | POA: Diagnosis not present

## 2016-03-05 DIAGNOSIS — E1140 Type 2 diabetes mellitus with diabetic neuropathy, unspecified: Secondary | ICD-10-CM | POA: Diagnosis present

## 2016-03-05 DIAGNOSIS — D539 Nutritional anemia, unspecified: Secondary | ICD-10-CM | POA: Diagnosis present

## 2016-03-05 DIAGNOSIS — E871 Hypo-osmolality and hyponatremia: Secondary | ICD-10-CM | POA: Diagnosis not present

## 2016-03-05 DIAGNOSIS — S91301A Unspecified open wound, right foot, initial encounter: Secondary | ICD-10-CM | POA: Diagnosis not present

## 2016-03-05 DIAGNOSIS — M216X1 Other acquired deformities of right foot: Secondary | ICD-10-CM | POA: Diagnosis not present

## 2016-03-05 DIAGNOSIS — L97514 Non-pressure chronic ulcer of other part of right foot with necrosis of bone: Secondary | ICD-10-CM | POA: Diagnosis not present

## 2016-03-05 LAB — LACTIC ACID, PLASMA
Lactic Acid, Venous: 1.2 mmol/L (ref 0.5–1.9)
Lactic Acid, Venous: 0.9 mmol/L (ref 0.5–1.9)

## 2016-03-05 LAB — BASIC METABOLIC PANEL
Anion gap: 10 (ref 5–15)
BUN: 29 mg/dL — ABNORMAL HIGH (ref 6–20)
CO2: 28 mmol/L (ref 22–32)
Calcium: 8.5 mg/dL — ABNORMAL LOW (ref 8.9–10.3)
Chloride: 96 mmol/L — ABNORMAL LOW (ref 101–111)
Creatinine, Ser: 6.62 mg/dL — ABNORMAL HIGH (ref 0.61–1.24)
GFR calc Af Amer: 9 mL/min — ABNORMAL LOW (ref >60)
GFR calc non Af Amer: 8 mL/min — ABNORMAL LOW (ref >60)
Glucose, Bld: 76 mg/dL (ref 65–99)
Potassium: 4.7 mmol/L (ref 3.5–5.1)
Sodium: 134 mmol/L — ABNORMAL LOW (ref 135–145)

## 2016-03-05 LAB — PROCALCITONIN: Procalcitonin: 8 ng/mL

## 2016-03-05 LAB — GLUCOSE, CAPILLARY
Glucose-Capillary: 57 mg/dL — ABNORMAL LOW (ref 65–99)
Glucose-Capillary: 103 mg/dL — ABNORMAL HIGH (ref 65–99)
Glucose-Capillary: 104 mg/dL — ABNORMAL HIGH (ref 65–99)
Glucose-Capillary: 73 mg/dL (ref 65–99)

## 2016-03-05 LAB — CBC WITH DIFFERENTIAL/PLATELET
Basophils Absolute: 0 10*3/uL (ref 0.0–0.1)
Basophils Relative: 0 %
Eosinophils Absolute: 0 10*3/uL (ref 0.0–0.7)
Eosinophils Relative: 0 %
HCT: 31.7 % — ABNORMAL LOW (ref 39.0–52.0)
Hemoglobin: 10.6 g/dL — ABNORMAL LOW (ref 13.0–17.0)
Lymphocytes Relative: 3 %
Lymphs Abs: 0.5 10*3/uL — ABNORMAL LOW (ref 0.7–4.0)
MCH: 28.2 pg (ref 26.0–34.0)
MCHC: 33.4 g/dL (ref 30.0–36.0)
MCV: 84.3 fL (ref 78.0–100.0)
Monocytes Absolute: 1.1 10*3/uL — ABNORMAL HIGH (ref 0.1–1.0)
Monocytes Relative: 7 %
Neutro Abs: 13 10*3/uL — ABNORMAL HIGH (ref 1.7–7.7)
Neutrophils Relative %: 90 %
Platelets: 217 10*3/uL (ref 150–400)
RBC: 3.76 MIL/uL — ABNORMAL LOW (ref 4.22–5.81)
RDW: 16.7 % — ABNORMAL HIGH (ref 11.5–15.5)
WBC: 14.6 10*3/uL — ABNORMAL HIGH (ref 4.0–10.5)

## 2016-03-05 LAB — HEPARIN LEVEL (UNFRACTIONATED): Heparin Unfractionated: <0.1 IU/mL — ABNORMAL LOW (ref 0.30–0.70)

## 2016-03-05 LAB — PROTIME-INR
INR: 1.73
INR: 1.94
Prothrombin Time: 20.5 seconds — ABNORMAL HIGH (ref 11.4–15.2)
Prothrombin Time: 22.5 seconds — ABNORMAL HIGH (ref 11.4–15.2)

## 2016-03-05 LAB — HEMOGLOBIN AND HEMATOCRIT, BLOOD
HCT: 30.8 % — ABNORMAL LOW (ref 39.0–52.0)
Hemoglobin: 10.4 g/dL — ABNORMAL LOW (ref 13.0–17.0)

## 2016-03-05 LAB — MRSA PCR SCREENING: MRSA by PCR: NEGATIVE

## 2016-03-05 LAB — I-STAT CG4 LACTIC ACID, ED: Lactic Acid, Venous: 1.8 mmol/L (ref 0.5–1.9)

## 2016-03-05 LAB — APTT: aPTT: 52 seconds — ABNORMAL HIGH (ref 24–36)

## 2016-03-05 MED ORDER — DEXTROSE 50 % IV SOLN
INTRAVENOUS | Status: AC
  Filled 2016-03-05: qty 50

## 2016-03-05 MED ORDER — PIPERACILLIN-TAZOBACTAM 3.375 G IVPB 30 MIN: 3.3750 g | Freq: Once | INTRAVENOUS | Status: DC

## 2016-03-05 MED ORDER — SEVELAMER CARBONATE 800 MG PO TABS
1600.0000 mg | ORAL_TABLET | Freq: Three times a day (TID) | ORAL | Status: DC
  Administered 2016-03-05 – 2016-03-10 (×13): 1600 mg via ORAL
  Filled 2016-03-05 (×13): qty 2

## 2016-03-05 MED ORDER — RENA-VITE PO TABS
1.0000 | ORAL_TABLET | Freq: Every day | ORAL | Status: DC
  Administered 2016-03-05 – 2016-03-16 (×11): 1 via ORAL
  Filled 2016-03-05 (×11): qty 1

## 2016-03-05 MED ORDER — PIPERACILLIN-TAZOBACTAM 3.375 G IVPB 30 MIN
3.3750 g | Freq: Once | INTRAVENOUS | Status: AC
  Administered 2016-03-05: 3.375 g via INTRAVENOUS
  Filled 2016-03-05: qty 50

## 2016-03-05 MED ORDER — INSULIN ASPART 100 UNIT/ML ~~LOC~~ SOLN
0.0000 [IU] | Freq: Three times a day (TID) | SUBCUTANEOUS | Status: DC
Start: 2016-03-05 — End: 2016-03-17
  Administered 2016-03-06: 1 [IU] via SUBCUTANEOUS
  Administered 2016-03-12: 2 [IU] via SUBCUTANEOUS
  Administered 2016-03-14: 1 [IU] via SUBCUTANEOUS

## 2016-03-05 MED ORDER — VANCOMYCIN HCL 10 G IV SOLR
2000.0000 mg | Freq: Once | INTRAVENOUS | Status: AC
  Administered 2016-03-05: 2000 mg via INTRAVENOUS
  Filled 2016-03-05: qty 2000

## 2016-03-05 MED ORDER — SODIUM CHLORIDE 0.9 % IV BOLUS (SEPSIS): 1000.0000 mL | Freq: Once | INTRAVENOUS | Status: DC

## 2016-03-05 MED ORDER — ONDANSETRON HCL 4 MG/2ML IJ SOLN
4.0000 mg | Freq: Four times a day (QID) | INTRAMUSCULAR | Status: DC | PRN
  Administered 2016-03-08 – 2016-03-10 (×2): 4 mg via INTRAVENOUS
  Filled 2016-03-05 (×3): qty 2

## 2016-03-05 MED ORDER — PIPERACILLIN-TAZOBACTAM 3.375 G IVPB
3.3750 g | Freq: Two times a day (BID) | INTRAVENOUS | Status: DC
  Administered 2016-03-05 – 2016-03-15 (×20): 3.375 g via INTRAVENOUS
  Filled 2016-03-05 (×23): qty 50

## 2016-03-05 MED ORDER — DEXTROSE 50 % IV SOLN
1.0000 | Freq: Once | INTRAVENOUS | Status: AC
  Administered 2016-03-05: 50 mL via INTRAVENOUS

## 2016-03-05 MED ORDER — SODIUM CHLORIDE 0.9 % IV BOLUS (SEPSIS)
1000.0000 mL | Freq: Once | INTRAVENOUS | Status: AC
  Administered 2016-03-05: 1000 mL via INTRAVENOUS

## 2016-03-05 MED ORDER — ACETAMINOPHEN 325 MG PO TABS
650.0000 mg | ORAL_TABLET | Freq: Once | ORAL | Status: AC
  Administered 2016-03-05: 650 mg via ORAL
  Filled 2016-03-05: qty 2

## 2016-03-05 MED ORDER — SODIUM CHLORIDE 0.9% FLUSH
3.0000 mL | Freq: Two times a day (BID) | INTRAVENOUS | Status: DC
  Administered 2016-03-05 – 2016-03-17 (×17): 3 mL via INTRAVENOUS

## 2016-03-05 MED ORDER — SODIUM CHLORIDE 0.9 % IV BOLUS (SEPSIS)
1000.0000 mL | Freq: Once | INTRAVENOUS | Status: DC
Start: 2016-03-05 — End: 2016-03-17

## 2016-03-05 MED ORDER — POLYSACCHARIDE IRON COMPLEX 150 MG PO CAPS
150.0000 mg | ORAL_CAPSULE | Freq: Every day | ORAL | Status: DC
  Administered 2016-03-05 – 2016-03-17 (×12): 150 mg via ORAL
  Filled 2016-03-05 (×13): qty 1

## 2016-03-05 MED ORDER — ACETAMINOPHEN 325 MG PO TABS
650.0000 mg | ORAL_TABLET | Freq: Four times a day (QID) | ORAL | Status: DC | PRN
Start: 2016-03-05 — End: 2016-03-17
  Administered 2016-03-05 – 2016-03-15 (×8): 650 mg via ORAL
  Filled 2016-03-05 (×8): qty 2

## 2016-03-05 MED ORDER — COLLAGENASE 250 UNIT/GM EX OINT
TOPICAL_OINTMENT | Freq: Every day | CUTANEOUS | Status: DC
  Administered 2016-03-05 – 2016-03-11 (×8): via TOPICAL
  Filled 2016-03-05 (×2): qty 30

## 2016-03-05 MED ORDER — VANCOMYCIN HCL IN DEXTROSE 1-5 GM/200ML-% IV SOLN: 1000.0000 mg | Freq: Once | INTRAVENOUS | Status: DC

## 2016-03-05 MED ORDER — ACETAMINOPHEN 650 MG RE SUPP
650.0000 mg | Freq: Four times a day (QID) | RECTAL | Status: DC | PRN
  Filled 2016-03-05: qty 1

## 2016-03-05 MED ORDER — ONDANSETRON HCL 4 MG PO TABS
4.0000 mg | ORAL_TABLET | Freq: Four times a day (QID) | ORAL | Status: DC | PRN
  Administered 2016-03-10 – 2016-03-16 (×3): 4 mg via ORAL
  Filled 2016-03-05 (×5): qty 1

## 2016-03-05 MED ORDER — HEPARIN (PORCINE) IN NACL 100-0.45 UNIT/ML-% IJ SOLN
1750.0000 [IU]/h | INTRAMUSCULAR | Status: DC
  Administered 2016-03-05: 1750 [IU]/h via INTRAVENOUS
  Filled 2016-03-05: qty 250

## 2016-03-05 MED ORDER — INSULIN GLARGINE 100 UNIT/ML ~~LOC~~ SOLN
30.0000 [IU] | Freq: Every day | SUBCUTANEOUS | Status: DC
  Filled 2016-03-05: qty 0.3

## 2016-03-05 MED ORDER — INSULIN ASPART 100 UNIT/ML ~~LOC~~ SOLN: 0.0000 [IU] | Freq: Every day | SUBCUTANEOUS | Status: DC

## 2016-03-05 MED ORDER — INSULIN ASPART 100 UNIT/ML ~~LOC~~ SOLN: 10.0000 [IU] | Freq: Three times a day (TID) | SUBCUTANEOUS | Status: DC

## 2016-03-05 MED ORDER — SODIUM CHLORIDE 0.9 % IV BOLUS (SEPSIS)
1000.0000 mL | Freq: Once | INTRAVENOUS | Status: AC
  Administered 2016-03-05: 500 mL via INTRAVENOUS

## 2016-03-05 MED ORDER — SODIUM CHLORIDE 0.9 % IV BOLUS (SEPSIS)
500.0000 mL | Freq: Once | INTRAVENOUS | Status: AC
  Administered 2016-03-05: 500 mL via INTRAVENOUS

## 2016-03-05 NOTE — Progress Notes (Signed)
Pharmacy Antibiotic Note  [name] is a 65 y.o. male admitted on 03/05/2016 with osteomyelitis / sepsis.  Pharmacy has been consulted for Vancomycin and Zosyn dosing.  Plan:  Vancomycin 2000mg  IV x 1 then 1000mg  IV after each HD Pre-Hemodialysis Vancomycin level goal range =15-25 mcg/ml Zosyn 3.375gm IV q12h, EID Monitor labs, renal fxn, progress and c/s Deescalate ABX when improved / appropriate.    Height: 6' 3.5" (191.8 cm) Weight: 255 lb 11.7 oz (116 kg) IBW/kg (Calculated) : 85.65  Temp (24hrs), Avg:100.7 F (38.2 C), Min:98.7 F (37.1 C), Max:102.3 F (39.1 C)   Recent Labs Lab 03/05/16 0727 03/05/16 0740  WBC 14.6*  --   CREATININE 6.62*  --   LATICACIDVEN  --  1.80    Estimated Creatinine Clearance: 16 mL/min (by C-G formula based on SCr of 6.62 mg/dL (H)).    Allergies  Allergen Reactions  . No Known Allergies     Antimicrobials this admission: Vancomycin 10/5 >>  Zosyn 10/5 >>   Dose adjustments this admission:   Recent Results (from the past 240 hour(s))  Blood culture (routine x 2)     Status: None (Preliminary result)   Collection Time: 03/05/16  7:28 AM  Result Value Ref Range Status   Specimen Description BLOOD  Final   Special Requests NONE  Final   Culture NO GROWTH <12 HOURS  Final   Report Status PENDING  Incomplete  Blood culture (routine x 2)     Status: None (Preliminary result)   Collection Time: 03/05/16  7:28 AM  Result Value Ref Range Status   Specimen Description BLOOD  Final   Special Requests NONE  Final   Culture NO GROWTH <12 HOURS  Final   Report Status PENDING  Incomplete   Thank you for allowing pharmacy to be a part of this patient's care.  Hart Robinsons A 03/05/2016 10:17 AM

## 2016-03-05 NOTE — ED Triage Notes (Signed)
Pt sent over from dialysis center due to pt having fever and generalized weakness. Pt did not receive dialysis today per ems.

## 2016-03-05 NOTE — ED Provider Notes (Signed)
[facility] DEPT Provider Note   CSN: [id number] Arrival date & time: 03/05/16  3716     History   Chief Complaint Chief Complaint  Patient presents with  . Fever    HPI [name] is a 66 y.o. male.  HPI    65 year old male sent from dialysis for reported fever. Patient says that he feels "pretty rough." He feels tired and "hurting all over." Symptoms beginning yesterday and progressively worsening. Temperature reportedly 100.0 at dialysis today. He did not receive dialysis and was instead sent the emergency room. He last had dialysis on Tuesday. He reports that he is anicteric. Occasional nonproductive cough. He is essentially anuric. He has shaking chills. He has chronic wounds to his right foot and is followed at a wound care center in [place]. His PCP is Dr. Reynaldo Minium. His nephrologist is Dr. Florene Glen. Per his report, his dry weight is 117 kg and he was 116 at dialysis this morning  Past Medical History:  Diagnosis Date  . Anemia   . Arthritis    HNP- lumbar, "all over my body"  . Blood transfusion    "years ago; blood was low" (08/05/2013)  . CKD (chronic kidney disease) stage 4, GFR 15-29 ml/min (HCC) 03/18/2012   [place]- T,TH,Sat.  . Diabetic nephropathy ([place])   . Diabetic retinopathy   . DVT (deep venous thrombosis) ([place])    "got one in my right leg now; I've had one before too, not sure which leg" (08/05/2013)  . ESRD (end stage renal disease) on dialysis [facility])    "just started today, (08/04/2013)"  . Family history of anesthesia complication    " my son wakes up slowly"  . GERD (gastroesophageal reflux disease)    uses alka seltzere on occas.   [name])    "one q now and then" (08/05/2013)  . Hyperlipidemia   . Hypertension   . 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 Non Hodgkin's lymphoma ([place])    Tx 2009; "had chemo; it went away" (08/05/2013)  .  Noncompliance 03/16/2012  . NSVT (nonsustained ventricular tachycardia) ([place]) 03/18/2012  . Peripheral vascular disease ([place])   . Pneumonia 2013   hosp.-   . Poor historian    pt. unsure of several answers to health history questions   . Skin cancer    melanoma - head  . Sleep apnea    "suppose to have a sleep study, but they never told me when. (08/05/2013)    Patient Active Problem List   Diagnosis Date Noted  . Skin cancer of scalp 11/22/2015  . History of colonic polyps   . Benign neoplasm of transverse colon   . Hyperglycemia 08/04/2013  . Uremia 08/04/2013  . Chronic kidney disease (CKD), stage IV (severe) ([place]) 03/21/2013  . End stage renal disease ([place]) 01/31/2013  . Chest pain with low risk for cardiac etiology 12/27/2012  . Chronic anticoagulation 12/27/2012  . 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 Vitreous hemorrhage ([place]) 04/19/2012  . CKD (chronic kidney disease) stage 4, GFR 15-29 ml/min (HCC) 03/18/2012  . NSVT (nonsustained ventricular tachycardia) ([place]) 03/18/2012  . Noncompliance 03/16/2012  . Chest pain, no MI, negative myoview, most likely muscular sketal pain 03/15/2012  . Proliferative diabetic retinopathy associated with type 2 diabetes mellitus ([place]) 08/18/2011  . 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 History of cardiac catheterization   . Non Hodgkin's  lymphoma ([place])   . Hyperlipidemia   . Diabetic retinopathy ([place])   . Diabetic nephropathy ([place])   . DVT (deep venous thrombosis) ([place])   . Cancer ([place])   . Acidosis, metabolic 18/84/1660  . PNA (pneumonia) 01/15/2011  . Renal failure, unspecified 01/12/2011  . Hypokalemia 01/12/2011  . Anemia 01/12/2011  . Shortness of breath dyspnea 01/12/2011  . Warfarin-induced coagulopathy ([place]) 01/12/2011  . History of DVT of lower extremity 01/12/2011  . Hypertension 01/12/2011  . LYMPHOMA 10/24/2008  . DIABETES MELLITUS-TYPE II 10/24/2008  . ABDOMINAL PAIN -GENERALIZED 10/24/2008  .  PERSONAL HX COLONIC POLYPS 10/24/2008    Past Surgical History:  Procedure Laterality Date  . AV FISTULA PLACEMENT Left 02/03/2013   Procedure: ARTERIOVENOUS (AV) FISTULA CREATION- LEFT RADIAL CEPHALIC; ULTRASOUND GUIDED;  Surgeon: Mal Misty, MD;  Location: [facility] OR;  Service: Vascular;  Laterality: Left;  . AV FISTULA PLACEMENT Right 11/08/2015   Procedure: RIGHT BRACHIOCEPHALIC ARTERIOVENOUS (AV) FISTULA CREATION;  Surgeon: Serafina Mitchell, MD;  Location: [facility];  Service: Vascular;  Laterality: Right;  . BASCILIC VEIN TRANSPOSITION Right 01/24/2016   Procedure: RIGHT SECOND STAGE BASILIC VEIN TRANSPOSITION;  Surgeon: Angelia Mould, MD;  Location: [facility];  Service: Vascular;  Laterality: Right;  . CARDIAC CATHETERIZATION    . COLONOSCOPY N/A 09/23/2015   Procedure: COLONOSCOPY;  Surgeon: Irene Shipper, MD;  Location: [facility];  Service: Endoscopy;  Laterality: N/A;  . Coloscopy    . EYE SURGERY Bilateral   . GAS INSERTION  05/10/2012   Procedure: INSERTION OF GAS;  Surgeon: Hayden Pedro, MD;  Location: [facility];  Service: Ophthalmology;  Laterality: Right;  . LESION EXCISION Right 05/08/2015   Procedure: EXCISION SCALP LESION;  Surgeon: Erroll Luna, MD;  Location: [place];  Service: General;  Laterality: Right;  . MEMBRANE PEEL  05/10/2012   Procedure: MEMBRANE PEEL;  Surgeon: Hayden Pedro, MD;  Location: [place];  Service: Ophthalmology;  Laterality: Right;  . PARS PLANA VITRECTOMY  08/27/2011   Procedure: PARS PLANA VITRECTOMY WITH 25 GAUGE;  Surgeon: Hayden Pedro, MD;  Location: [place];  Service: Ophthalmology;  Laterality: Left;  Repair of complex traction retinal detachment left eye  . PARS PLANA VITRECTOMY  05/10/2012   Procedure: PARS PLANA VITRECTOMY WITH 25 GAUGE;  Surgeon: Hayden Pedro, MD;  Location: [place];  Service: Ophthalmology;  Laterality: Right;  Repair Complex Traction Retinal Detachment  . PHOTOCOAGULATION WITH LASER  05/10/2012   Procedure: PHOTOCOAGULATION  WITH LASER;  Surgeon: Hayden Pedro, MD;  Location: [facility];  Service: Ophthalmology;  Laterality: Right;  . PORT-A-CATH REMOVAL    . PORTACATH PLACEMENT    . VENA CAVA FILTER PLACEMENT  09/2012   due to preparation for surgery       Home Medications    Prior to Admission medications   Medication Sig Start Date End Date Taking? Authorizing Provider  acetaminophen (TYLENOL) 325 MG tablet Take 650 mg by mouth every 6 (six) hours as needed (pain).   Yes Historical Provider, MD  amlodipine-benazepril (LOTREL) 2.5-10 MG capsule Take 1 capsule by mouth daily.   Yes Historical Provider, MD  cephALEXin (KEFLEX) 500 MG capsule Take 1 capsule (500 mg total) by mouth 3 (three) times daily. 02/12/16  Yes Wallene Huh, DPM  diclofenac sodium (VOLTAREN) 1 % GEL Apply topically 4 (four) times daily.   Yes Historical Provider, MD  ergocalciferol (VITAMIN D2) 50000 units capsule Take 50,000 Units by mouth once  a week.   Yes Historical Provider, MD  gatifloxacin (ZYMAXID) 0.5 % SOLN 1 drop.   Yes Historical Provider, MD  insulin glargine (LANTUS) 100 UNIT/ML injection Inject 0.3 mLs (30 Units total) into the skin at bedtime. 08/07/13  Yes Burnard Bunting, MD  insulin lispro (HUMALOG) 100 UNIT/ML injection Inject 10 Units into the skin 3 (three) times daily with meals.    Yes Historical Provider, MD  lidocaine-prilocaine (EMLA) cream Apply 1 application topically as needed (Apply small amount to access site 1-2 hours before dialysis. Cover with occlusive dressing (saran wrap)).    Yes Historical Provider, MD  multivitamin (RENA-VIT) TABS tablet Take 1 tablet by mouth at bedtime. 08/08/13  Yes Burnard Bunting, MD  polysaccharide iron (NIFEREX) 150 MG CAPS capsule Take 1 capsule (150 mg total) by mouth daily. 01/16/11  Yes Estela Leonie Green, MD  sevelamer carbonate (RENVELA) 800 MG tablet Take 1,600 mg by mouth 3 (three) times daily with meals.   Yes Historical Provider, MD  CARTIA XT 120 MG 24 hr capsule  Take 120 mg by mouth daily. 03/03/15   Historical Provider, MD  oxyCODONE-acetaminophen (PERCOCET/ROXICET) 5-325 MG tablet Take 1 tablet by mouth every 6 (six) hours as needed for moderate pain. 01/24/16   Alvia Grove, PA-C  warfarin (COUMADIN) 5 MG tablet Take 1.5 tablets (7.5 mg total) by mouth at bedtime. 01/25/16   Alvia Grove, PA-C    Family History Family History  Problem Relation Age of Onset  . Anesthesia problems Son   . Hypertension Son   . Diabetes Father   . Hypertension Father   . Other Father     amputation  . Colon cancer Neg Hx     Social History Social History  Substance Use Topics  . Smoking status: Never Smoker  . Smokeless tobacco: Never Used  . Alcohol use No     Allergies   No known allergies   Review of Systems Review of Systems   All systems reviewed and negative, other than as noted in HPI.    Physical Exam Updated Vital Signs BP (!) 76/44   Pulse 111   Temp (S) 102.3 F (39.1 C) (Rectal) Comment (Src): Dr. Wilson Singer aware  Resp 22   SpO2 96%   Physical Exam  Constitutional: He appears well-developed and well-nourished. No distress.  HENT:  Head: Normocephalic and atraumatic.  Eyes: Conjunctivae are normal. Right eye exhibits no discharge. Left eye exhibits no discharge.  Neck: Neck supple.  Cardiovascular: Normal rate, regular rhythm and normal heart sounds.  Exam reveals no gallop and no friction rub.   No murmur heard. Pulmonary/Chest: Effort normal and breath sounds normal. No respiratory distress.  Abdominal: Soft. He exhibits no distension. There is no tenderness.  Musculoskeletal: He exhibits no edema or tenderness.  Neurological: He is alert.  Skin: Skin is warm and dry.  Psychiatric: He has a normal mood and affect. His behavior is normal. Thought content normal.  Nursing note and vitals reviewed.    ED Treatments / Results  Labs (all labs ordered are listed, but only abnormal results are displayed) Labs Reviewed    CBC WITH DIFFERENTIAL/PLATELET - Abnormal; Notable for the following:       Result Value   WBC 14.6 (*)    RBC 3.76 (*)    Hemoglobin 10.6 (*)    HCT 31.7 (*)    RDW 16.7 (*)    Neutro Abs 13.0 (*)    Lymphs Abs 0.5 (*)  Monocytes Absolute 1.1 (*)    All other components within normal limits  BASIC METABOLIC PANEL - Abnormal; Notable for the following:    Sodium 134 (*)    Chloride 96 (*)    BUN 29 (*)    Creatinine, Ser 6.62 (*)    Calcium 8.5 (*)    GFR calc non Af Amer 8 (*)    GFR calc Af Amer 9 (*)    All other components within normal limits  PROTIME-INR - Abnormal; Notable for the following:    Prothrombin Time 20.5 (*)    All other components within normal limits  CULTURE, BLOOD (ROUTINE X 2)  CULTURE, BLOOD (ROUTINE X 2)  I-STAT CG4 LACTIC ACID, ED    EKG  EKG Interpretation None       Radiology Dg Chest Portable 1 View  Result Date: 03/05/2016 CLINICAL DATA:  Fever and generalized weakness.  Dialysis patient. EXAM: PORTABLE CHEST 1 VIEW COMPARISON:  Chest radiograph 06/09/2013. FINDINGS: The heart is enlarged. Dialysis catheter tip lies in the RIGHT atrium. Moderate vascular congestion without overt failure. Asymmetric opacity at the RIGHT base could represent early infiltrate or subsegmental atelectasis. No effusion or pneumothorax. Degenerative change RIGHT shoulder. IMPRESSION: Asymmetric opacity at the RIGHT base could represent early infiltrate or atelectasis. PA and lateral chest recommended when stable. Cardiomegaly with moderate vascular congestion. Electronically Signed   By: Staci Righter [name]   On: 03/05/2016 07:59   Dg Foot 2 Views Right  Result Date: 03/05/2016 CLINICAL DATA:  Fever and generalized weakness with oozing foot wounds EXAM: RIGHT FOOT - 2 VIEW COMPARISON:  02/12/2016 FINDINGS: Generalized osteopenia is again identified. Diffuse vascular calcifications are seen. No definitive bony erosive changes to suggest osteomyelitis are noted.  Degenerative changes in the tarsal bones are again seen. Plantar calcaneal spurs are noted. IMPRESSION: Chronic changes without acute abnormality. Electronically Signed   By: Inez Catalina [name]   On: 03/05/2016 07:56    Procedures Procedures (including critical care time)  CRITICAL CARE Performed by: Virgel Manifold Total critical care time: 40 minutes Critical care time was exclusive of separately billable procedures and treating other patients. Critical care was necessary to treat or prevent imminent or life-threatening deterioration. Critical care was time spent personally by me on the following activities: development of treatment plan with patient and/or surrogate as well as nursing, discussions with consultants, evaluation of patient's response to treatment, examination of patient, obtaining history from patient or surrogate, ordering and performing treatments and interventions, ordering and review of laboratory studies, ordering and review of radiographic studies, pulse oximetry and re-evaluation of patient's condition.   Medications Ordered in ED Medications  vancomycin (VANCOCIN) 2,000 mg in sodium chloride 0.9 % 500 mL IVPB (not administered)  sodium chloride 0.9 % bolus 1,000 mL (1,000 mLs Intravenous New Bag/Given 03/05/16 0741)  acetaminophen (TYLENOL) tablet 650 mg (650 mg Oral Given 03/05/16 0747)  piperacillin-tazobactam (ZOSYN) IVPB 3.375 g (0 g Intravenous Stopped 03/05/16 0811)  sodium chloride 0.9 % bolus 1,000 mL (1,000 mLs Intravenous New Bag/Given 03/05/16 0833)  sodium chloride 0.9 % bolus 1,000 mL (1,000 mLs Intravenous New Bag/Given 03/05/16 0831)  sodium chloride 0.9 % bolus 500 mL (500 mLs Intravenous New Bag/Given 03/05/16 0834)     Initial Impression / Assessment and Plan / ED Course  I have reviewed the triage vital signs and the nursing notes.  Pertinent labs & imaging results that were available during my care of the patient were reviewed by me and considered  in  my medical decision making (see chart for details).  Clinical Course    64yM with severe sepsis. Wounds to r toes/distal foot possible source. CXR also with possible R sided infiltrate.  vanc/zosyn should cover both. Initially normotensive, but now hypotensive. Additional IVF boluses ordered. Lactic acid is normal. Blood cultures obtained. The exposed portion of his dialysis catheter does not have a clean appearance but skin at insertion looks fine. No significant redness. No drainage.   Final Clinical Impressions(s) / ED Diagnoses   Final diagnoses:  Severe sepsis ([place])  Cellulitis of toe of right foot  ESRD (end stage renal disease) ([place])    New Prescriptions New Prescriptions   No medications on file     Virgel Manifold, MD 03/09/16 [phone]

## 2016-03-05 NOTE — Progress Notes (Signed)
Inpatient Diabetes Program Recommendations  AACE/ADA: New Consensus Statement on Inpatient Glycemic Control (2015)  Target Ranges:  Prepandial:   less than 140 mg/dL      Peak postprandial:   less than 180 mg/dL (1-2 hours)      Critically ill patients:  140 - 180 mg/dL   Results for DUKE, WEISENSEL (MRN [id number]) as of 03/05/2016 13:17  Ref. Range 03/05/2016 11:55 03/05/2016 12:34  Glucose-Capillary Latest Ref Range: 65 - 99 mg/dL 57 (L) 103 (H)   Review of Glycemic Control  Diabetes history: DM2 Outpatient Diabetes medications: Lantus 30 units QHS, Humalog 10 units TID with meals Current orders for Inpatient glycemic control: Lantus 30 units QHS, Novolog 0-9 units TID with meals, Novolog 0-5 units QHS, Novolog 10 units TID with meals  Inpatient Diabetes Program Recommendations: Insulin - Basal: Please consider discontinuing Lantus at this time and if glucose is consistently greater than 180 mg/dl recommend starting with Lantus 12 units QHS (based on 118.9 kg x 0.1 units). Insulin - Meal Coverage: Please consider decreasing Novolog meal coverage to 5 units TID with meals if patient eats at least 50% of meal.  NOTE: Initial glucose 76 mg/dl today and glucose 57 mg/dl at 11:55 today. Talked with patient over the phone briefly to inquire about outpatient DM medication regimen. Patient states that he has not taken any Lantus in a couple of days. Patient stated "I am suppose to take Lantus 30 units QHS but I don't always take it and sometimes skip it. I take the Humalog 10 units TID with meal like I am suppose to."  Therefore, may want to consider discontinuing Lantus at this time and once glucose is consistently greater than 180 mg/dl MD may want to consider starting Lantus 12 units QHS. Will continue to follow.  Thanks, Barnie Alderman, RN, MSN, CDE Diabetes Coordinator Inpatient Diabetes Program [phone] (Team Pager from [place] to [place]) [phone] ([facility] office) [phone] [facility]  office) [phone] [facility] office)

## 2016-03-05 NOTE — ED Notes (Signed)
Vascular access team notified of need for IJ.

## 2016-03-05 NOTE — ED Notes (Signed)
Do not repeat 3 hour Lactic Acid per Dr. Wilson Singer.

## 2016-03-05 NOTE — Consult Note (Signed)
Reason for Consult:nd-stage renal disease Referring Physician: Dr. Telford Nab [name] is an 65 y.o. male.  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 When he was evaluated patient was found to have severely hypotensive and possibility of sepsis was entertained admitted to the hospital. Presently patient denies any nausea or vomiting. Patient in general of first known complaints. He denies any difficulty breathing. His last dialysis was on Tuesday which is his regular schedule.  Past Medical History:  Diagnosis Date  . Anemia   . Arthritis    HNP- lumbar, "all over my body"  . Blood transfusion    "years ago; blood was low" (08/05/2013)  . CKD (chronic kidney disease) stage 4, GFR 15-29 ml/min (HCC) 03/18/2012   [place]- T,TH,Sat.  . Diabetic nephropathy ([place])   . Diabetic retinopathy   . DVT (deep venous thrombosis) ([place])    "got one in my right leg now; I've had one before too, not sure which leg" (08/05/2013)  . ESRD (end stage renal disease) on dialysis [facility])    "just started today, (08/04/2013)"  . Family history of anesthesia complication    " my son wakes up slowly"  . GERD (gastroesophageal reflux disease)    uses alka seltzere on occas.   [name])    "one q now and then" (08/05/2013)  . Hyperlipidemia   . Hypertension   . 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 Noncompliance 03/16/2012  . NSVT (nonsustained ventricular tachycardia) ([place]) 03/18/2012  . Peripheral vascular disease ([place])   . Pneumonia 2013   hosp.-   . Poor historian    pt. unsure of several answers to health history questions   . Skin cancer    melanoma - head  . Sleep apnea    "suppose to have a sleep study, but they never told me when. (08/05/2013)    Past Surgical History:  Procedure Laterality Date  . AV FISTULA PLACEMENT Left 02/03/2013   Procedure: ARTERIOVENOUS (AV) FISTULA CREATION- LEFT RADIAL CEPHALIC; ULTRASOUND GUIDED;  Surgeon: Mal Misty, MD;  Location: [facility] OR;  Service: Vascular;  Laterality: Left;  . AV FISTULA PLACEMENT Right 11/08/2015   Procedure: RIGHT BRACHIOCEPHALIC ARTERIOVENOUS (AV) FISTULA CREATION;  Surgeon: Serafina Mitchell, MD;  Location: [place];  Service: Vascular;  Laterality: Right;  . BASCILIC VEIN TRANSPOSITION Right 01/24/2016   Procedure: RIGHT SECOND STAGE BASILIC VEIN TRANSPOSITION;  Surgeon: Angelia Mould, MD;  Location: [place];  Service: Vascular;  Laterality: Right;  . CARDIAC CATHETERIZATION    . COLONOSCOPY N/A 09/23/2015   Procedure: COLONOSCOPY;  Surgeon: Irene Shipper, MD;  Location: [facility];  Service: Endoscopy;  Laterality: N/A;  . Coloscopy    . EYE SURGERY Bilateral   . GAS INSERTION  05/10/2012   Procedure: INSERTION OF GAS;  Surgeon: Hayden Pedro, MD;  Location: [place];  Service: Ophthalmology;  Laterality: Right;  . LESION EXCISION Right 05/08/2015   Procedure: EXCISION SCALP LESION;  Surgeon: Erroll Luna, MD;  Location: [place];  Service: General;  Laterality: Right;  . MEMBRANE PEEL  05/10/2012   Procedure: MEMBRANE PEEL;  Surgeon: Hayden Pedro, MD;  Location: [place] OR;  Service: Ophthalmology;  Laterality: Right;  . PARS PLANA VITRECTOMY  08/27/2011   Procedure: PARS PLANA VITRECTOMY WITH 25 GAUGE;  Surgeon: Hayden Pedro, MD;  Location: [place];  Service: Ophthalmology;  Laterality: Left;  Repair of complex traction retinal detachment left eye  . PARS PLANA VITRECTOMY  05/10/2012   Procedure: PARS PLANA VITRECTOMY WITH 25 GAUGE;  Surgeon: Hayden Pedro, MD;  Location: [place];  Service: Ophthalmology;  Laterality: Right;  Repair Complex Traction Retinal Detachment  .  PHOTOCOAGULATION WITH LASER  05/10/2012   Procedure: PHOTOCOAGULATION WITH LASER;  Surgeon: Hayden Pedro, MD;  Location: [place];  Service: Ophthalmology;  Laterality: Right;  . PORT-A-CATH REMOVAL    . PORTACATH PLACEMENT    . VENA CAVA FILTER PLACEMENT  09/2012   due to preparation for surgery    Family History  Problem Relation Age of Onset  . Anesthesia problems Son   . Hypertension Son   . Diabetes Father   . Hypertension Father   . Other Father     amputation  . Colon cancer Neg Hx     Social History:  reports that he has never smoked. He has never used smokeless tobacco. He reports that he does not drink alcohol or use drugs.  Allergies:  Allergies  Allergen Reactions  . No Known Allergies     Medications: I have reviewed the patient'[name] current medications.  Results for orders placed or performed during the hospital encounter of 03/05/16 (from the past 48 hour([name]))  CBC with Differential     Status: Abnormal   Collection Time: 03/05/16  7:27 AM  Result Value Ref Range   WBC 14.6 (H) 4.0 - 10.5 K/uL   RBC 3.76 (L) 4.22 - 5.81 MIL/uL   Hemoglobin 10.6 (L) 13.0 - 17.0 g/dL   HCT 31.7 (L) 39.0 - 52.0 %   MCV 84.3 78.0 - 100.0 fL   MCH 28.2 26.0 - 34.0 pg   MCHC 33.4 30.0 - 36.0 g/dL   RDW 16.7 (H) 11.5 - 15.5 %   Platelets 217 150 - 400 K/uL   Neutrophils Relative % 90 %   Neutro Abs 13.0 (H) 1.7 - 7.7 K/uL   Lymphocytes Relative 3 %   Lymphs Abs 0.5 (L) 0.7 - 4.0 K/uL   Monocytes Relative 7 %   Monocytes Absolute 1.1 (H) 0.1 - 1.0 K/uL   Eosinophils Relative 0 %   Eosinophils Absolute 0.0 0.0 - 0.7 K/uL   Basophils Relative 0 %   Basophils Absolute 0.0 0.0 - 0.1 K/uL  Basic metabolic panel     Status: Abnormal   Collection Time: 03/05/16  7:27 AM  Result Value Ref Range   Sodium 134 (L) 135 - 145 mmol/L   Potassium 4.7 3.5 - 5.1 mmol/L   Chloride 96 (L) 101 - 111 mmol/L   CO2 28 22 - 32 mmol/L   Glucose, Bld 76 65 - 99 mg/dL   BUN 29 (H) 6 - 20 mg/dL    Creatinine, Ser 6.62 (H) 0.61 - 1.24 mg/dL   Calcium 8.5 (L) 8.9 - 10.3 mg/dL   GFR calc non Af Amer 8 (L) >60 mL/min   GFR calc Af Amer 9 (L) >60 mL/min    Comment: (NOTE) The eGFR has been calculated using the CKD EPI equation. This calculation has not been validated in all clinical situations. eGFR'[name] persistently <60 mL/min signify possible Chronic Kidney Disease.    Anion gap 10 5 -  15  Protime-INR     Status: Abnormal   Collection Time: 03/05/16  7:27 AM  Result Value Ref Range   Prothrombin Time 20.5 (H) 11.4 - 15.2 seconds   INR 1.73   Blood culture (routine x 2)     Status: None (Preliminary result)   Collection Time: 03/05/16  7:28 AM  Result Value Ref Range   Specimen Description BLOOD    Special Requests NONE    Culture NO GROWTH <12 HOURS    Report Status PENDING   Blood culture (routine x 2)     Status: None (Preliminary result)   Collection Time: 03/05/16  7:28 AM  Result Value Ref Range   Specimen Description BLOOD    Special Requests NONE    Culture NO GROWTH <12 HOURS    Report Status PENDING   I-Stat CG4 Lactic Acid, ED     Status: None   Collection Time: 03/05/16  7:40 AM  Result Value Ref Range   Lactic Acid, Venous 1.80 0.5 - 1.9 mmol/L    Dg Chest Portable 1 View  Result Date: 03/05/2016 CLINICAL DATA:  Fever and generalized weakness.  Dialysis patient. EXAM: PORTABLE CHEST 1 VIEW COMPARISON:  Chest radiograph 06/09/2013. FINDINGS: The heart is enlarged. Dialysis catheter tip lies in the RIGHT atrium. Moderate vascular congestion without overt failure. Asymmetric opacity at the RIGHT base could represent early infiltrate or subsegmental atelectasis. No effusion or pneumothorax. Degenerative change RIGHT shoulder. IMPRESSION: Asymmetric opacity at the RIGHT base could represent early infiltrate or atelectasis. PA and lateral chest recommended when stable. Cardiomegaly with moderate vascular congestion. Electronically Signed   By: Staci Righter [name]   On:  03/05/2016 07:59   Dg Foot 2 Views Right  Result Date: 03/05/2016 CLINICAL DATA:  Fever and generalized weakness with oozing foot wounds EXAM: RIGHT FOOT - 2 VIEW COMPARISON:  02/12/2016 FINDINGS: Generalized osteopenia is again identified. Diffuse vascular calcifications are seen. No definitive bony erosive changes to suggest osteomyelitis are noted. Degenerative changes in the tarsal bones are again seen. Plantar calcaneal spurs are noted. IMPRESSION: Chronic changes without acute abnormality. Electronically Signed   By: Inez Catalina [name]   On: 03/05/2016 07:56    Review of Systems  Constitutional: Positive for chills and fever.  Respiratory: Negative for cough, hemoptysis and shortness of breath.   Cardiovascular: Positive for leg swelling. Negative for orthopnea.  Gastrointestinal: Negative for nausea and vomiting.   Blood pressure (!) 142/49, pulse 95, temperature ([name]) 101.1 F (38.4 C), temperature source Rectal, resp. rate 22, height 6' 3.5" (1.918 m), weight 116 kg (255 lb 11.7 oz), SpO2 96 %. Physical Exam  Constitutional: He is oriented to person, place, and time. No distress.  Neck: JVD present.  Cardiovascular: Normal rate and regular rhythm.   Murmur heard. Respiratory: No respiratory distress. He has no wheezes.  GI: He exhibits no distension. There is no tenderness.  Musculoskeletal: He exhibits edema.  Neurological: He is alert and oriented to person, place, and time.    Assessment/Plan: Problem #1 end-stage renal disease: He is status post hemodialysis on Tuesday. Presently his potassium is normal and he doesn't have any uremic sinus symptoms. Problem #2 anemia Problem #3 hypotension: His blood pressure is slightly improving. Most likely from sepsis which most likely include his bilateral ulcers/gangrene. patient also has tunneled catheter on the right side which could be a source of infection.presently he is on antibiotics. Problem #4 history of diabetes Problem# 5  history of sleep apnea Problem #  6 history of DVT Problem #7 bilateral cellulitis/ gangrene. Patient has significantly discolored lower extremities with multiple ulcers and excoriations on his toes. Problem #8 history of metabolic bone disease Plan: We'll make arrangements for patient to get dialysis tomorrow once his blood pressure stabilizes. We'll check renal panel and CBC.  [name] 03/05/2016, 11:26 AM

## 2016-03-05 NOTE — H&P (Addendum)
History and Physical    AMORI COLOMB WNI:[id number] DOB: [birth year] DOA: 03/05/2016  PCP: Geoffery Lyons, MD  Patient coming from: home  Chief Complaint: fever  HPI: [name] is a 65 y.o. male with medical history significant of end-stage renal disease on hemodialysis. Patient had undergone dialysis on 10/3 without any issues. He reports the following day he felt unwell. Has felt generally weak and aching all over. This morning he presented to dialysis and was found to be febrile as well as hypotensive. He was sent to the emergency room for evaluation. Denies any shortness of breath or cough. He's not had any vomiting or diarrhea. No abdominal pain. No chest pain. He does have a chronic wound on his right foot involving several of his toes. He is followed by a podiatrist [place]. He reports that his wound had been doing fairly well. He denies any foul odor when he wrapped his wound yesterday..  ED Course: In the emergency room patient was noted to be febrile with temperature 102. Initially blood pressure was in the 90s but has since trended down to the 70s. Patient is mentating and able to answer questions appropriately. Chest x-ray shows questionable infiltrate versus atelectasis. Remainder of labs are unremarkable. Lactic acid was found to be normal range. He was given 1 L of IV fluid, and when he remained hypotensive, remainder of IV fluids per sepsis protocol was ordered. He has been started on intravenous antibiotics. He's been referred for admission.  Review of Systems: As per HPI otherwise 10 point review of systems negative.    Past Medical History:  Diagnosis Date  . Anemia   . Arthritis    HNP- lumbar, "all over my body"  . Blood transfusion    "years ago; blood was low" (08/05/2013)  . CKD (chronic kidney disease) stage 4, GFR 15-29 ml/min (HCC) 03/18/2012   [place]- T,TH,Sat.  . Diabetic nephropathy ([place])   . Diabetic retinopathy   . DVT (deep venous  thrombosis) ([place])    "got one in my right leg now; I've had one before too, not sure which leg" (08/05/2013)  . ESRD (end stage renal disease) on dialysis [facility])    "just started today, (08/04/2013)"  . Family history of anesthesia complication    " my son wakes up slowly"  . GERD (gastroesophageal reflux disease)    uses alka seltzere on occas.   [name])    "one q now and then" (08/05/2013)  . Hyperlipidemia   . Hypertension   . 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 Non Hodgkin's lymphoma ([place])    Tx 2009; "had chemo; it went away" (08/05/2013)  . Noncompliance 03/16/2012  . NSVT (nonsustained ventricular tachycardia) ([place]) 03/18/2012  . Peripheral vascular disease ([place])   . Pneumonia 2013   hosp.-   . Poor historian    pt. unsure of several answers to health history questions   . Skin cancer    melanoma - head  . Sleep apnea    "suppose to have a sleep study, but they never told me when. (08/05/2013)    Past Surgical History:  Procedure Laterality Date  . AV FISTULA PLACEMENT Left 02/03/2013   Procedure: ARTERIOVENOUS (AV) FISTULA CREATION- LEFT RADIAL CEPHALIC; ULTRASOUND GUIDED;  Surgeon: Mal Misty, MD;  Location: [place];  Service: Vascular;  Laterality: Left;  . AV FISTULA PLACEMENT Right 11/08/2015  Procedure: RIGHT BRACHIOCEPHALIC ARTERIOVENOUS (AV) FISTULA CREATION;  Surgeon: Serafina Mitchell, MD;  Location: [place];  Service: Vascular;  Laterality: Right;  . BASCILIC VEIN TRANSPOSITION Right 01/24/2016   Procedure: RIGHT SECOND STAGE BASILIC VEIN TRANSPOSITION;  Surgeon: Angelia Mould, MD;  Location: [place];  Service: Vascular;  Laterality: Right;  . CARDIAC CATHETERIZATION    . COLONOSCOPY N/A 09/23/2015   Procedure: COLONOSCOPY;  Surgeon: Irene Shipper, MD;  Location: [facility];  Service: Endoscopy;  Laterality: N/A;  . Coloscopy    . EYE SURGERY Bilateral   . GAS INSERTION   05/10/2012   Procedure: INSERTION OF GAS;  Surgeon: Hayden Pedro, MD;  Location: [place];  Service: Ophthalmology;  Laterality: Right;  . LESION EXCISION Right 05/08/2015   Procedure: EXCISION SCALP LESION;  Surgeon: Erroll Luna, MD;  Location: [place];  Service: General;  Laterality: Right;  . MEMBRANE PEEL  05/10/2012   Procedure: MEMBRANE PEEL;  Surgeon: Hayden Pedro, MD;  Location: [place];  Service: Ophthalmology;  Laterality: Right;  . PARS PLANA VITRECTOMY  08/27/2011   Procedure: PARS PLANA VITRECTOMY WITH 25 GAUGE;  Surgeon: Hayden Pedro, MD;  Location: [place];  Service: Ophthalmology;  Laterality: Left;  Repair of complex traction retinal detachment left eye  . PARS PLANA VITRECTOMY  05/10/2012   Procedure: PARS PLANA VITRECTOMY WITH 25 GAUGE;  Surgeon: Hayden Pedro, MD;  Location: [place];  Service: Ophthalmology;  Laterality: Right;  Repair Complex Traction Retinal Detachment  . PHOTOCOAGULATION WITH LASER  05/10/2012   Procedure: PHOTOCOAGULATION WITH LASER;  Surgeon: Hayden Pedro, MD;  Location: [place];  Service: Ophthalmology;  Laterality: Right;  . PORT-A-CATH REMOVAL    . PORTACATH PLACEMENT    . VENA CAVA FILTER PLACEMENT  09/2012   due to preparation for surgery     reports that he has never smoked. He has never used smokeless tobacco. He reports that he does not drink alcohol or use drugs.  Allergies  Allergen Reactions  . No Known Allergies     Family History  Problem Relation Age of Onset  . Anesthesia problems Son   . Hypertension Son   . Diabetes Father   . Hypertension Father   . Other Father     amputation  . Colon cancer Neg Hx      Prior to Admission medications   Medication Sig Start Date End Date Taking? Authorizing Provider  acetaminophen (TYLENOL) 325 MG tablet Take 650 mg by mouth every 6 (six) hours as needed (pain).   Yes Historical Provider, MD  amlodipine-benazepril (LOTREL) 2.5-10 MG capsule Take 1 capsule by mouth daily.   Yes  Historical Provider, MD  cephALEXin (KEFLEX) 500 MG capsule Take 1 capsule (500 mg total) by mouth 3 (three) times daily. 02/12/16  Yes Wallene Huh, DPM  diclofenac sodium (VOLTAREN) 1 % GEL Apply topically 4 (four) times daily.   Yes Historical Provider, MD  ergocalciferol (VITAMIN D2) 50000 units capsule Take 50,000 Units by mouth once a week.   Yes Historical Provider, MD  gatifloxacin (ZYMAXID) 0.5 % SOLN 1 drop.   Yes Historical Provider, MD  insulin glargine (LANTUS) 100 UNIT/ML injection Inject 0.3 mLs (30 Units total) into the skin at bedtime. 08/07/13  Yes Burnard Bunting, MD  insulin lispro (HUMALOG) 100 UNIT/ML injection Inject 10 Units into the skin 3 (three) times daily with meals.    Yes Historical Provider, MD  lidocaine-prilocaine (EMLA) cream Apply 1 application topically  as needed (Apply small amount to access site 1-2 hours before dialysis. Cover with occlusive dressing (saran wrap)).    Yes Historical Provider, MD  multivitamin (RENA-VIT) TABS tablet Take 1 tablet by mouth at bedtime. 08/08/13  Yes Burnard Bunting, MD  polysaccharide iron (NIFEREX) 150 MG CAPS capsule Take 1 capsule (150 mg total) by mouth daily. 01/16/11  Yes Estela Leonie Green, MD  sevelamer carbonate (RENVELA) 800 MG tablet Take 1,600 mg by mouth 3 (three) times daily with meals.   Yes Historical Provider, MD  oxyCODONE-acetaminophen (PERCOCET/ROXICET) 5-325 MG tablet Take 1 tablet by mouth every 6 (six) hours as needed for moderate pain. 01/24/16   Alvia Grove, PA-C  warfarin (COUMADIN) 5 MG tablet Take 1.5 tablets (7.5 mg total) by mouth at bedtime. 01/25/16   Alvia Grove, PA-C    Physical Exam: Vitals:   03/05/16 0750 03/05/16 0800 03/05/16 0830 03/05/16 0856  BP: (!) 89/68 (!) 76/44 (!) 73/45   Pulse: 114 111 66   Resp:      Temp:    (S) 101.1 F (38.4 C)  TempSrc:    Rectal  SpO2: 97% 96% 96%   Weight:      Height:          Constitutional: NAD, calm, comfortable Vitals:    03/05/16 0750 03/05/16 0800 03/05/16 0830 03/05/16 0856  BP: (!) 89/68 (!) 76/44 (!) 73/45   Pulse: 114 111 66   Resp:      Temp:    (S) 101.1 F (38.4 C)  TempSrc:    Rectal  SpO2: 97% 96% 96%   Weight:      Height:       Eyes: PERRL, lids and conjunctivae normal ENMT: Mucous membranes are moist. Posterior pharynx clear of any exudate or lesions.Normal dentition.  Neck: normal, supple, no masses, no thyromegaly Respiratory: clear to auscultation bilaterally, no wheezing, no crackles. Normal respiratory effort. No accessory muscle use.  Cardiovascular: Regular rate and rhythm, no murmurs / rubs / gallops. No extremity edema. 2+ pedal pulses. No carotid bruits.  Abdomen: no tenderness, no masses palpated. No hepatosplenomegaly. Bowel sounds positive.  Musculoskeletal: no clubbing / cyanosis. No joint deformity upper and lower extremities. Good ROM, no contractures. Normal muscle tone.  Skin: toes on right foot appear macerated and have foul smell, right leg appears edematous Neurologic: CN 2-12 grossly intact. Sensation intact, DTR normal. Strength 5/5 in all 4.  Psychiatric: Normal judgment and insight. Alert and oriented x 3. Normal mood.   Labs on Admission: I have personally reviewed following labs and imaging studies  CBC:  Recent Labs Lab 03/05/16 0727  WBC 14.6*  NEUTROABS 13.0*  HGB 10.6*  HCT 31.7*  MCV 84.3  PLT 433   Basic Metabolic Panel:  Recent Labs Lab 03/05/16 0727  NA 134*  K 4.7  CL 96*  CO2 28  GLUCOSE 76  BUN 29*  CREATININE 6.62*  CALCIUM 8.5*   GFR: Estimated Creatinine Clearance: 16 mL/min (by C-G formula based on SCr of 6.62 mg/dL (H)). Liver Function Tests: No results for input(s): AST, ALT, ALKPHOS, BILITOT, PROT, ALBUMIN in the last 168 hours. No results for input(s): LIPASE, AMYLASE in the last 168 hours. No results for input(s): AMMONIA in the last 168 hours. Coagulation Profile:  Recent Labs Lab 03/05/16 0727  INR 1.73    Cardiac Enzymes: No results for input(s): CKTOTAL, CKMB, CKMBINDEX, TROPONINI in the last 168 hours. BNP (last 3 results) No results for input(s):  PROBNP in the last 8760 hours. HbA1C: No results for input(s): HGBA1C in the last 72 hours. CBG: No results for input(s): GLUCAP in the last 168 hours. Lipid Profile: No results for input(s): CHOL, HDL, LDLCALC, TRIG, CHOLHDL, LDLDIRECT in the last 72 hours. Thyroid Function Tests: No results for input(s): TSH, T4TOTAL, FREET4, T3FREE, THYROIDAB in the last 72 hours. Anemia Panel: No results for input(s): VITAMINB12, FOLATE, FERRITIN, TIBC, IRON, RETICCTPCT in the last 72 hours. Urine analysis:    Component Value Date/Time   COLORURINE YELLOW 08/04/2013 [place] 08/04/2013 0249   LABSPEC 1.013 08/04/2013 0249   PHURINE 7.5 08/04/2013 0249   GLUCOSEU 500 (A) 08/04/2013 0249   HGBUR SMALL (A) 08/04/2013 0249   BILIRUBINUR NEGATIVE 08/04/2013 0249   KETONESUR NEGATIVE 08/04/2013 0249   PROTEINUR 100 (A) 08/04/2013 0249   UROBILINOGEN 0.2 08/04/2013 0249   NITRITE NEGATIVE 08/04/2013 0249   LEUKOCYTESUR NEGATIVE 08/04/2013 0249   Sepsis Labs: !!!!!!!!!!!!!!!!!!!!!!!!!!!!!!!!!!!!!!!!!!!! @LABRCNTIP (procalcitonin:4,lacticidven:4) ) Recent Results (from the past 240 hour(s))  Blood culture (routine x 2)     Status: None (Preliminary result)   Collection Time: 03/05/16  7:28 AM  Result Value Ref Range Status   Specimen Description BLOOD  Final   Special Requests NONE  Final   Culture NO GROWTH <12 HOURS  Final   Report Status PENDING  Incomplete  Blood culture (routine x 2)     Status: None (Preliminary result)   Collection Time: 03/05/16  7:28 AM  Result Value Ref Range Status   Specimen Description BLOOD  Final   Special Requests NONE  Final   Culture NO GROWTH <12 HOURS  Final   Report Status PENDING  Incomplete     Radiological Exams on Admission: Dg Chest Portable 1 View  Result Date:  03/05/2016 CLINICAL DATA:  Fever and generalized weakness.  Dialysis patient. EXAM: PORTABLE CHEST 1 VIEW COMPARISON:  Chest radiograph 06/09/2013. FINDINGS: The heart is enlarged. Dialysis catheter tip lies in the RIGHT atrium. Moderate vascular congestion without overt failure. Asymmetric opacity at the RIGHT base could represent early infiltrate or subsegmental atelectasis. No effusion or pneumothorax. Degenerative change RIGHT shoulder. IMPRESSION: Asymmetric opacity at the RIGHT base could represent early infiltrate or atelectasis. PA and lateral chest recommended when stable. Cardiomegaly with moderate vascular congestion. Electronically Signed   By: Staci Righter [name]   On: 03/05/2016 07:59   Dg Foot 2 Views Right  Result Date: 03/05/2016 CLINICAL DATA:  Fever and generalized weakness with oozing foot wounds EXAM: RIGHT FOOT - 2 VIEW COMPARISON:  02/12/2016 FINDINGS: Generalized osteopenia is again identified. Diffuse vascular calcifications are seen. No definitive bony erosive changes to suggest osteomyelitis are noted. Degenerative changes in the tarsal bones are again seen. Plantar calcaneal spurs are noted. IMPRESSION: Chronic changes without acute abnormality. Electronically Signed   By: Inez Catalina [name]   On: 03/05/2016 07:56    Assessment/Plan Active Problems:   Diabetes mellitus ([place])   History of DVT of lower extremity   Hypertension   End stage renal disease (HCC)   Severe sepsis (HCC)   Cellulitis of right foot   Sepsis ([place])   1. Severe sepsis. Source is likely his right foot. Without cough or shortness of breath, pneumonia would be less likely. Blood cultures have already been sent. He is on vancomycin and Zosyn. He is receiving fluids per sepsis protocol. Initial lactic acid was noted to be normal range, we'll continue to follow. If blood pressure is not improved with  fluid boluses, may need to start vasopressors. Have requested vascular services to place internal jugular  catheter.  2. Cellulitis right foot. Initial plain films do not indicate any underlying osteomyelitis. Will request MRI for further characterization. Wound care consult has been requested. If there is evidence of underlying osteomyelitis, will likely need surgical consultation. Continue intravenous antibiotics for now.  3. End-stage renal disease on hemodialysis. Does not appear to emergently need dialysis at this point. Will consult nephrology to follow for dialysis needs.  4. History of hypertension. Patient is on amlodipine/benazepril. Blood pressures are currently hypotensive. We'll hold these medications for now.  5. History of DVT on Coumadin. INR subtherapeutic. Will hold Coumadin for now and start on intravenous heparin until we determine that surgical intervention will not be needed for his foot.  6. Diabetes. Continue Lantus and NovoLog with meal coverage. Start sliding scale insulin.   DVT prophylaxis: heparin infusion Code Status: full code Family Communication: no family present Disposition Plan: discharge home pending clinical improvement Consults called: nephrology, wound care Admission status: ICU, inpatient  Critical care time: 33mins  Yajayra Feldt MD [facility] Pager [phone]  If 7PM-7AM, please contact night-coverage [email or web address] Password [email or web address]  03/05/2016, 9:15 AM

## 2016-03-05 NOTE — Progress Notes (Signed)
Hypoglycemic Event  CBG: 57  Treatment: D50 IV 50 mL  Symptoms: Hungry  Follow-up CBG: Time:1234 CBG Result:103  Possible Reasons for Event: Inadequate meal intake  Comments/MD notified:memon    Schonewitz, Eulis Canner

## 2016-03-05 NOTE — Progress Notes (Signed)
ANTICOAGULATION CONSULT NOTE - Initial Consult  Pharmacy Consult for HEPARIN Indication: h/o DVT, INR subtherapeutic on admission  Allergies  Allergen Reactions  . No Known Allergies     Patient Measurements: Height: 6' 3.5" (191.8 cm) Weight: 255 lb 11.7 oz (116 kg) IBW/kg (Calculated) : 85.65 HEPARIN DW (KG): 109.7  Vital Signs: Temp: 101.1 F (38.4 C) (10/05 0856) Temp Source: Rectal (10/05 0856) BP: 73/45 (10/05 0830) Pulse Rate: 66 (10/05 0830)  Labs:  Recent Labs  03/05/16 0727  HGB 10.6*  HCT 31.7*  PLT 217  LABPROT 20.5*  INR 1.73  CREATININE 6.62*    Estimated Creatinine Clearance: 16 mL/min (by C-G formula based on SCr of 6.62 mg/dL (H)).   Medical History: Past Medical History:  Diagnosis Date  . Anemia   . Arthritis    HNP- lumbar, "all over my body"  . Blood transfusion    "years ago; blood was low" (08/05/2013)  . CKD (chronic kidney disease) stage 4, GFR 15-29 ml/min (HCC) 03/18/2012   [place]- T,TH,Sat.  . Diabetic nephropathy ([place])   . Diabetic retinopathy   . DVT (deep venous thrombosis) ([place])    "got one in my right leg now; I've had one before too, not sure which leg" (08/05/2013)  . ESRD (end stage renal disease) on dialysis [facility])    "just started today, (08/04/2013)"  . Family history of anesthesia complication    " my son wakes up slowly"  . GERD (gastroesophageal reflux disease)    uses alka seltzere on occas.   [name])    "one q now and then" (08/05/2013)  . Hyperlipidemia   . Hypertension   . 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 Non Hodgkin's lymphoma ([place])    Tx 2009; "had chemo; it went away" (08/05/2013)  . Noncompliance 03/16/2012  . NSVT (nonsustained ventricular tachycardia) ([place]) 03/18/2012  . Peripheral vascular disease ([place])   . Pneumonia 2013   hosp.-   . Poor historian    pt. unsure of several answers to  health history questions   . Skin cancer    melanoma - head  . Sleep apnea    "suppose to have a sleep study, but they never told me when. (08/05/2013)    Medications:   (Not in a hospital admission)  Assessment: 65yo male on chronic Coumadin at home due to h/o DVT.  INR subtherapeutic on admission, asked to initiate Heparin until INR at goal.  Goal of Therapy:  Heparin level 0.3-0.7 units/ml Monitor platelets by anticoagulation protocol: Yes   Plan:  Heparin infusion at 1750 units/hr  No bolus due to elevated INR Heparin level in 6-8 hrs then daily Monitor CBC, s/sx of bleeding complications  [place] Crane, Candance Bohlman A 03/05/2016,10:00 AM

## 2016-03-05 NOTE — Consult Note (Addendum)
[facility] Nurse wound consult note Reason for Consult:Right foot wound Wound type: full thickness Pressure Ulcer POA: No, not pressure related Measurement: 4.5cm x 5cm x 0.1cm Wound bed:25% black 75% pink granulating Drainage (amount, consistency, odor) mod amt, malodorous Periwound:darkened skin but intact Dressing procedure/placement/frequency: This assessment was done via elink camera with assistance from bedside nurses for measurements, odors, drainage.  I have provided nurses with orders for foot care starting with, Use house antimicrobiatile (InterDry Ag+)  according to these instructions. Wash skin gently, pat dry, do not rub.  With clean scissors, cut enough fabric to cover the affected area (run a strip in between toes), allowing for a minimum of 2 inches to extend beyond the skin fold for moisture evaporation. Also ordered for Santyl to be applied daily to blackened area of wound, cover that area with NS moistened gauze and apply pink foam dressing. We will not follow, but will remain available to this patient, to nursing, and the medical and/or surgical teams.  Please re-consult if we need to assist further.    Fara Olden, RN-C, WTA-C Wound Treatment Associate Julien Girt MSN, RN, [place], [place], [place]

## 2016-03-06 DIAGNOSIS — I10 Essential (primary) hypertension: Secondary | ICD-10-CM

## 2016-03-06 LAB — CBC
HCT: 29.9 % — ABNORMAL LOW (ref 39.0–52.0)
Hemoglobin: 10.3 g/dL — ABNORMAL LOW (ref 13.0–17.0)
MCH: 28.7 pg (ref 26.0–34.0)
MCHC: 34.4 g/dL (ref 30.0–36.0)
MCV: 83.3 fL (ref 78.0–100.0)
Platelets: 201 10*3/uL (ref 150–400)
RBC: 3.59 MIL/uL — ABNORMAL LOW (ref 4.22–5.81)
RDW: 16.5 % — ABNORMAL HIGH (ref 11.5–15.5)
WBC: 13.5 10*3/uL — ABNORMAL HIGH (ref 4.0–10.5)

## 2016-03-06 LAB — GLUCOSE, CAPILLARY
Glucose-Capillary: 68 mg/dL (ref 65–99)
Glucose-Capillary: 105 mg/dL — ABNORMAL HIGH (ref 65–99)
Glucose-Capillary: 112 mg/dL — ABNORMAL HIGH (ref 65–99)
Glucose-Capillary: 125 mg/dL — ABNORMAL HIGH (ref 65–99)
Glucose-Capillary: 92 mg/dL (ref 65–99)

## 2016-03-06 LAB — COMPREHENSIVE METABOLIC PANEL
ALT: 14 U/L — ABNORMAL LOW (ref 17–63)
AST: 25 U/L (ref 15–41)
Albumin: 2.2 g/dL — ABNORMAL LOW (ref 3.5–5.0)
Alkaline Phosphatase: 136 U/L — ABNORMAL HIGH (ref 38–126)
Anion gap: 10 (ref 5–15)
BUN: 36 mg/dL — ABNORMAL HIGH (ref 6–20)
CO2: 24 mmol/L (ref 22–32)
Calcium: 8.2 mg/dL — ABNORMAL LOW (ref 8.9–10.3)
Chloride: 100 mmol/L — ABNORMAL LOW (ref 101–111)
Creatinine, Ser: 7.25 mg/dL — ABNORMAL HIGH (ref 0.61–1.24)
GFR calc Af Amer: 8 mL/min — ABNORMAL LOW (ref >60)
GFR calc non Af Amer: 7 mL/min — ABNORMAL LOW (ref >60)
Glucose, Bld: 75 mg/dL (ref 65–99)
Potassium: 4 mmol/L (ref 3.5–5.1)
Sodium: 134 mmol/L — ABNORMAL LOW (ref 135–145)
Total Bilirubin: 4 mg/dL — ABNORMAL HIGH (ref 0.3–1.2)
Total Protein: 6.3 g/dL — ABNORMAL LOW (ref 6.5–8.1)

## 2016-03-06 LAB — PROTIME-INR
INR: 2.2
Prothrombin Time: 24.8 seconds — ABNORMAL HIGH (ref 11.4–15.2)

## 2016-03-06 MED ORDER — WARFARIN SODIUM 7.5 MG PO TABS
7.5000 mg | ORAL_TABLET | Freq: Once | ORAL | Status: AC
  Administered 2016-03-06: 7.5 mg via ORAL
  Filled 2016-03-06: qty 1

## 2016-03-06 MED ORDER — OXYCODONE HCL 5 MG PO TABS
5.0000 mg | ORAL_TABLET | Freq: Four times a day (QID) | ORAL | Status: DC | PRN
  Administered 2016-03-06 – 2016-03-17 (×14): 5 mg via ORAL
  Filled 2016-03-06 (×15): qty 1

## 2016-03-06 MED ORDER — WARFARIN - PHARMACIST DOSING INPATIENT: Status: DC

## 2016-03-06 MED ORDER — VANCOMYCIN HCL IN DEXTROSE 1-5 GM/200ML-% IV SOLN
1000.0000 mg | INTRAVENOUS | Status: DC
  Administered 2016-03-06 – 2016-03-13 (×4): 1000 mg via INTRAVENOUS
  Filled 2016-03-06 (×3): qty 200

## 2016-03-06 NOTE — Care Management Important Message (Signed)
Important Message  Patient Details  Name: [name] MRN: [id number] Date of Birth: 07/16/1950   Medicare Important Message Given:  Yes    Sherald Barge, RN 03/06/2016, 10:25 AM

## 2016-03-06 NOTE — Progress Notes (Signed)
Pt informed that dialysis nurse has called and will be here around 0100 to start his dialysis. Pt is agreeable to this. He will rest until that time.

## 2016-03-06 NOTE — Progress Notes (Signed)
Subjective: Interval History: has no complaint of difficulty breathing. Presently his appetite is good. Patient also denies any fever but he has some chills..  Objective: Vital signs in last 24 hours: Temp:  [97.1 F (36.2 C)-101.1 F (38.4 C)] 97.1 F (36.2 C) (10/06 0753) Pulse Rate:  [70-101] 98 (10/06 0600) Resp:  [10-34] 21 (10/06 0600) BP: (69-153)/(17-104) 119/48 (10/06 0600) SpO2:  [84 %-99 %] 95 % (10/06 0600) Weight:  [118.9 kg (262 lb 2 oz)] 118.9 kg (262 lb 2 oz) (10/05 1132) Weight change:   Intake/Output from previous day: 10/05 0701 - 10/06 0700 In: 4394.7 [P.O.:720; I.V.:74.7; IV Piggyback:3600] Out: -  Intake/Output this shift: No intake/output data recorded.  General appearance: alert, cooperative and no distress Resp: clear to auscultation bilaterally Cardio: regular rate and rhythm GI: soft, non-tender; bowel sounds normal; no masses,  no organomegaly Extremities: venous stasis dermatitis noted and He has edema and possible gangrene and cellulitis of his toes.  Lab Results:  Recent Labs  03/05/16 0727 03/05/16 1554 03/06/16 0529  WBC 14.6*  --  13.5*  HGB 10.6* 10.4* 10.3*  HCT 31.7* 30.8* 29.9*  PLT 217  --  201   BMET:  Recent Labs  03/05/16 0727 03/06/16 0529  NA 134* 134*  K 4.7 4.0  CL 96* 100*  CO2 28 24  GLUCOSE 76 75  BUN 29* 36*  CREATININE 6.62* 7.25*  CALCIUM 8.5* 8.2*   No results for input(s): PTH in the last 72 hours. Iron Studies: No results for input(s): IRON, TIBC, TRANSFERRIN, FERRITIN in the last 72 hours.  Studies/Results: Mr Foot Right Wo Contrast  Result Date: 03/06/2016 CLINICAL DATA:  Patient admitted to the hospital 03/04/2016 for fever and hypotension. Chronic wound on the right foot involving multiple toes. Dialysis patient. EXAM: MRI OF THE RIGHT FOREFOOT WITHOUT CONTRAST TECHNIQUE: Multiplanar, multisequence MR imaging was performed. No intravenous contrast was administered. COMPARISON:  Plain films of the  right foot 03/05/2016. FINDINGS: The study is somewhat degraded by patient motion. Subcutaneous edema is present about the foot. A skin ulceration is seen along the distal aspect of the great toe on the medial side. At the level the distal phalanx, the ulcer extends almost to bone. No marrow edema in the great toe is identified. The patient appears to have a transverse fracture through the middle phalanx of the third toe which is nondisplaced. There is very mild marrow edema in the middle phalanx. No acute fracture is identified. First MTP osteoarthritis is seen. There is no focal fluid collection or mass. Intrinsic musculature the foot is atrophied. IMPRESSION: Large and deep ulceration along the medial aspect of the distal great toe without underlying abscess or evidence of osteomyelitis. Subcutaneous edema about the foot could be due to cellulitis or dependent change. Subacute to remote appearing nondisplaced fracture middle phalanx of the third toe. First tarsometatarsal and first MTP joint osteoarthritis. Remote fracture base of the second metatarsal. Electronically Signed   By: Inge Rise [name]   On: 03/06/2016 07:35   Dg Chest Portable 1 View  Result Date: 03/05/2016 CLINICAL DATA:  Fever and generalized weakness.  Dialysis patient. EXAM: PORTABLE CHEST 1 VIEW COMPARISON:  Chest radiograph 06/09/2013. FINDINGS: The heart is enlarged. Dialysis catheter tip lies in the RIGHT atrium. Moderate vascular congestion without overt failure. Asymmetric opacity at the RIGHT base could represent early infiltrate or subsegmental atelectasis. No effusion or pneumothorax. Degenerative change RIGHT shoulder. IMPRESSION: Asymmetric opacity at the RIGHT base could represent early infiltrate  or atelectasis. PA and lateral chest recommended when stable. Cardiomegaly with moderate vascular congestion. Electronically Signed   By: Staci Righter [name]   On: 03/05/2016 07:59   Dg Chest Port 1v Same Day  Result Date:  03/05/2016 CLINICAL DATA:  66 year old male status post central line placement. Initial encounter. EXAM: PORTABLE CHEST 1 VIEW COMPARISON:  0742 hours today and earlier. FINDINGS: Right IJ approach triple lumen appearing central line courses to the lower mediastinum at the cavoatrial junction level. Preexisting left IJ dual-lumen dialysis type catheter is unchanged. No pneumothorax. Stable lung volumes. Stable ventilation. Stable cardiomegaly and mediastinal contours. Calcified aortic atherosclerosis. Visualized tracheal air column is within normal limits. IMPRESSION: 1. Right IJ central line placed, tip at the cavoatrial junction level with no adverse features. 2. Otherwise stable chest. Electronically Signed   By: Genevie Ann [name]   On: 03/05/2016 13:49   Dg Foot 2 Views Right  Result Date: 03/05/2016 CLINICAL DATA:  Fever and generalized weakness with oozing foot wounds EXAM: RIGHT FOOT - 2 VIEW COMPARISON:  02/12/2016 FINDINGS: Generalized osteopenia is again identified. Diffuse vascular calcifications are seen. No definitive bony erosive changes to suggest osteomyelitis are noted. Degenerative changes in the tarsal bones are again seen. Plantar calcaneal spurs are noted. IMPRESSION: Chronic changes without acute abnormality. Electronically Signed   By: Inez Catalina [name]   On: 03/05/2016 07:56    I have reviewed the patient's current medications.  Assessment/Plan: Problem #1 sepsis: Presently on antibiotics. Patient is a febrile and his white blood cell count is improving. Presently he is on antibiotics. Problem #2 end-stage renal disease: He is status post hemodialysis on Tuesday. Presently the patient is asymptomatic. Problem #3 hypotension: Most likely from infection. His blood pressure at this moment has improved Problem #4 history of diabetes Problem #5 metabolic bone disease: His calcium is in range but phosphorus is not available.. Patient is on Renvela as a binder. Problem #6 anemia: His  hemoglobin is within our target goal. Plan: 1] We'll make arrangements for patient to get dialysis today for 4 hours 2]Ww will remove about 2 1/2 l if systolic blood pressure remains above 90 3]We'll check his renal panel in the morning. 4]We'll hold heparin.    LOS: 1 day   Cotey Rakes S 03/06/2016,8:51 AM

## 2016-03-06 NOTE — Progress Notes (Addendum)
ANTICOAGULATION CONSULT NOTE - Initial Consult  Pharmacy Consult for COUMADIN (chronic Rx PTA) Indication: VTE prophylaxis, h/o DVT, chronic Coumadin from home  Allergies  Allergen Reactions  . No Known Allergies    Patient Measurements: Height: 6' 3.5" (191.8 cm) Weight: 262 lb 2 oz (118.9 kg) IBW/kg (Calculated) : 85.65  Vital Signs: Temp: 97.1 F (36.2 C) (10/06 0753) Temp Source: Oral (10/06 0753) BP: 119/48 (10/06 0600) Pulse Rate: 98 (10/06 0600)  Labs:  Recent Labs  03/05/16 0727 03/05/16 1134 03/05/16 1355 03/05/16 1554 03/06/16 0529  HGB 10.6*  --   --  10.4* 10.3*  HCT 31.7*  --   --  30.8* 29.9*  PLT 217  --   --   --  201  APTT  --  52*  --   --   --   LABPROT 20.5* 22.5*  --   --  24.8*  INR 1.73 1.94  --   --  2.20  HEPARINUNFRC  --   --  <0.10*  --   --   CREATININE 6.62*  --   --   --  7.25*   Estimated Creatinine Clearance: 14.8 mL/min (by C-G formula based on SCr of 7.25 mg/dL (H)).  Medical History: Past Medical History:  Diagnosis Date  . Anemia   . Arthritis    HNP- lumbar, "all over my body"  . Blood transfusion    "years ago; blood was low" (08/05/2013)  . CKD (chronic kidney disease) stage 4, GFR 15-29 ml/min (HCC) 03/18/2012   [place]- T,TH,Sat.  . Diabetic nephropathy ([place])   . Diabetic retinopathy   . DVT (deep venous thrombosis) ([place])    "got one in my right leg now; I've had one before too, not sure which leg" (08/05/2013)  . ESRD (end stage renal disease) on dialysis [facility])    "just started today, (08/04/2013)"  . Family history of anesthesia complication    " my son wakes up slowly"  . GERD (gastroesophageal reflux disease)    uses alka seltzere on occas.   [name])    "one q now and then" (08/05/2013)  . Hyperlipidemia   . Hypertension   . 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 Non Hodgkin's lymphoma ([place])    Tx 2009;  "had chemo; it went away" (08/05/2013)  . Noncompliance 03/16/2012  . NSVT (nonsustained ventricular tachycardia) ([place]) 03/18/2012  . Peripheral vascular disease ([place])   . Pneumonia 2013   hosp.-   . Poor historian    pt. unsure of several answers to health history questions   . Skin cancer    melanoma - head  . Sleep apnea    "suppose to have a sleep study, but they never told me when. (08/05/2013)   Medications:  Prescriptions Prior to Admission  Medication Sig Dispense Refill Last Dose  . acetaminophen (TYLENOL) 325 MG tablet Take 650 mg by mouth every 6 (six) hours as needed (pain).   Past Week at Unknown time  . amlodipine-benazepril (LOTREL) 2.5-10 MG capsule Take 1 capsule by mouth daily.   03/04/2016 at Unknown time  . diclofenac sodium (VOLTAREN) 1 % GEL Apply topically 4 (four) times daily.   Past Week at Unknown time  . ergocalciferol (VITAMIN D2) 50000 units capsule Take 50,000 Units by mouth once a week.   Past Week at Unknown time  . insulin glargine (LANTUS) 100 UNIT/ML  injection Inject 0.3 mLs (30 Units total) into the skin at bedtime. 10 mL 11 Past Week at Unknown time  . insulin lispro (HUMALOG) 100 UNIT/ML injection Inject 10 Units into the skin 3 (three) times daily with meals.    03/04/2016 at Unknown time  . lidocaine-prilocaine (EMLA) cream Apply 1 application topically as needed (Apply small amount to access site 1-2 hours before dialysis. Cover with occlusive dressing (saran wrap)).    Past Month at Unknown time  . multivitamin (RENA-VIT) TABS tablet Take 1 tablet by mouth at bedtime. 30 tablet 0 Past Week at Unknown time  . oxyCODONE-acetaminophen (PERCOCET/ROXICET) 5-325 MG tablet Take 1 tablet by mouth every 6 (six) hours as needed for moderate pain. 30 tablet 0 03/04/2016 at Unknown time  . polysaccharide iron (NIFEREX) 150 MG CAPS capsule Take 1 capsule (150 mg total) by mouth daily. 30 each 2 Past Week at Unknown time  . sevelamer carbonate (RENVELA) 800 MG tablet  Take 1,600 mg by mouth 3 (three) times daily with meals.   Past Week at Unknown time  . warfarin (COUMADIN) 5 MG tablet Take 1.5 tablets (7.5 mg total) by mouth at bedtime. (Patient taking differently: Take 7.5-10 mg by mouth at bedtime. Take 1.5 tablets on Tuesday,Wed, and Thurs, Saturday, and Sunday.  Take 2 tablets on Monday and Friday) 30 tablet 1 03/04/2016 at Unknown time   Assessment: 65yo male on chronic Coumadin due to h/o DVT.  Pt's home dose listed above but reportedly dose has changed and pt not always compliant with ordered dose.  INR is therapeutic today despite no Coumadin given during this admission.  Pt has been on IV Heparin which has been stopped.  CBC appears stable.  No bleeding reported.   Goal of Therapy:  INR 2-3 Monitor platelets by anticoagulation protocol: Yes   Plan:  Coumadin 7.5mg  po today x 1 INR daily Monitor for s/sx of bleeding complications.    Hart Robinsons A 03/06/2016,10:37 AM

## 2016-03-06 NOTE — Care Management Note (Signed)
Case Management Note  Patient Details  Name: ANDRIY SHERK MRN: [id number] Date of Birth: [birth year]  Subjective/Objective:                  Pt admitted with sepsis from wound on foot. Pt is from home, lives alone and is ind with ALD's. He is on HD on Tuesdays and Saturdays at [facility] is [place]. He uses [facility] for transportation. Pt has been performing wound care himself. He will need [facility] nursing after DC for wound care. Pt has chosen [facility] from list of providers. Pt is aware that [facility] has 48hrs to make first visit. Manuela Schwartz, from [facility], is aware of referral and will obtian pt info from chart.   Action/Plan: Will cont to follow.   Expected Discharge Date:    03/10/2016              Expected Discharge Plan:  [place]  In-House Referral:  NA  Discharge planning Services  CM Consult  Post Acute Care Choice:  Home Health Choice offered to:  Patient  DME Arranged:    DME Agency:     HH Arranged:  RN [place] Agency:  [facility]  Status of Service:  In process, will continue to follow  If discussed at Long Length of Stay Meetings, dates discussed:    Additional Comments:  Sherald Barge, RN 03/06/2016, 10:19 AM

## 2016-03-06 NOTE — Progress Notes (Signed)
PROGRESS NOTE    [name]  QPR:[id number] DOB: 1950/10/07 DOA: 03/05/2016 PCP: Geoffery Lyons, MD    Brief Narrative:  65 year old patient, end-stage renal disease on hemodialysis who was sent to the hospital for his dialysis unit for fever and hypotension. He was found to have sepsis which was felt to be related to infection of his right foot. He was started on broad-spectrum antibiotics. MRI does not show any evidence of osteomyelitis or abscess. Wound care has seen the patient. Will follow their instructions. Blood pressures are improving with IV fluids. Continue current treatments.   Assessment & Plan:   Active Problems:   Diabetes mellitus ([place])   History of DVT of lower extremity   Hypertension   End stage renal disease ([place])   Severe sepsis ([place])   Cellulitis of right foot   Sepsis ([place])   1. Sepsis. Source is likely his right foot. Without cough or shortness of breath, pneumonia would be less likely. Blood cultures have been sent and have not shown any growth yet. He is on vancomycin and Zosyn. He received fluids per sepsis protocol. Lactic acid was noted to be normal range. Hypotension has improved with IV fluids.  2. Cellulitis right foot. Initial plain films do not indicate any underlying osteomyelitis. MRI does not show any deep infections including abscess or osteomyelitis. Wound care has seen the patient. Continue intravenous antibiotics for now.  3. End-stage renal disease on hemodialysis. Nephrology following for dialysis needs  4. History of hypertension. Patient is on amlodipine/benazepril. Blood pressures are currently on the low side. Will continue to hold these medications for now.  5. History of DVT on Coumadin. Last INR >2. Will continue coumadin per pharmacy.  6. Diabetes. Having episodes of hypoglycemia. lantus currently on hold. Hold meal coverage novolog and continue sliding scale for now   DVT prophylaxis: coumadin Code Status: full  code Family Communication: no family present Disposition Plan: discharge home with home health once stable.   Consultants:   Nephrology  WOC  Procedures:     Antimicrobials:   Vancomycin 10/5  Zosyn 10/5   Subjective: Had some issues yesterday with bleeding from IJ central line that was placed (now removed) which have since resolved. Overall feeling better. No cough or shortness of breath  Objective: Vitals:   03/06/16 0400 03/06/16 0500 03/06/16 0600 03/06/16 0753  BP: (!) 81/56 (!) 109/43 (!) 119/48   Pulse: 98 98 98   Resp: 18 (!) 34 (!) 21   Temp:    97.1 F (36.2 C)  TempSrc:    Oral  SpO2: 94% 92% 95%   Weight:      Height:        Intake/Output Summary (Last 24 hours) at 03/06/16 1017 Last data filed at 03/05/16 2100  Gross per 24 hour  Intake           844.67 ml  Output                0 ml  Net           844.67 ml   Filed Weights   03/05/16 0730 03/05/16 1132  Weight: 116 kg (255 lb 11.7 oz) 118.9 kg (262 lb 2 oz)    Examination:  General exam: Appears calm and comfortable  Respiratory system: Clear to auscultation. Respiratory effort normal. Cardiovascular system: S1 & S2 heard, RRR. No JVD, murmurs, rubs, gallops or clicks. 1+ pedal edema R>L. Gastrointestinal system: Abdomen is nondistended, soft and nontender. No  organomegaly or masses felt. Normal bowel sounds heard. Central nervous system: Alert and oriented. No focal neurological deficits. Extremities: Symmetric 5 x 5 power. Skin: right foot is wrapped in dressings Psychiatry: Judgement and insight appear normal. Mood & affect appropriate.     Data Reviewed: I have personally reviewed following labs and imaging studies  CBC:  Recent Labs Lab 03/05/16 0727 03/05/16 1554 03/06/16 0529  WBC 14.6*  --  13.5*  NEUTROABS 13.0*  --   --   HGB 10.6* 10.4* 10.3*  HCT 31.7* 30.8* 29.9*  MCV 84.3  --  83.3  PLT 217  --  932   Basic Metabolic Panel:  Recent Labs Lab 03/05/16 0727  03/06/16 0529  NA 134* 134*  K 4.7 4.0  CL 96* 100*  CO2 28 24  GLUCOSE 76 75  BUN 29* 36*  CREATININE 6.62* 7.25*  CALCIUM 8.5* 8.2*   GFR: Estimated Creatinine Clearance: 14.8 mL/min (by C-G formula based on SCr of 7.25 mg/dL (H)). Liver Function Tests:  Recent Labs Lab 03/06/16 0529  AST 25  ALT 14*  ALKPHOS 136*  BILITOT 4.0*  PROT 6.3*  ALBUMIN 2.2*   No results for input(s): LIPASE, AMYLASE in the last 168 hours. No results for input(s): AMMONIA in the last 168 hours. Coagulation Profile:  Recent Labs Lab 03/05/16 0727 03/05/16 1134 03/06/16 0529  INR 1.73 1.94 2.20   Cardiac Enzymes: No results for input(s): CKTOTAL, CKMB, CKMBINDEX, TROPONINI in the last 168 hours. BNP (last 3 results) No results for input(s): PROBNP in the last 8760 hours. HbA1C: No results for input(s): HGBA1C in the last 72 hours. CBG:  Recent Labs Lab 03/05/16 1234 03/05/16 1623 03/05/16 2106 03/06/16 0743 03/06/16 0837  GLUCAP 103* 104* 73 68 105*   Lipid Profile: No results for input(s): CHOL, HDL, LDLCALC, TRIG, CHOLHDL, LDLDIRECT in the last 72 hours. Thyroid Function Tests: No results for input(s): TSH, T4TOTAL, FREET4, T3FREE, THYROIDAB in the last 72 hours. Anemia Panel: No results for input(s): VITAMINB12, FOLATE, FERRITIN, TIBC, IRON, RETICCTPCT in the last 72 hours. Sepsis Labs:  Recent Labs Lab 03/05/16 0740 03/05/16 1134 03/05/16 1355  PROCALCITON  --  8.00  --   LATICACIDVEN 1.80 1.2 0.9    Recent Results (from the past 240 hour(s))  Blood culture (routine x 2)     Status: None (Preliminary result)   Collection Time: 03/05/16  7:28 AM  Result Value Ref Range Status   Specimen Description BLOOD LEFT ARM  Final   Special Requests BOTTLES DRAWN AEROBIC ONLY 5CC  Final   Culture NO GROWTH 1 DAY  Final   Report Status PENDING  Incomplete  Blood culture (routine x 2)     Status: None (Preliminary result)   Collection Time: 03/05/16  7:28 AM  Result  Value Ref Range Status   Specimen Description BLOOD LEFT HAND  Final   Special Requests   Final    BOTTLES DRAWN AEROBIC AND ANAEROBIC AEB=5CC ANA=3CC   Culture NO GROWTH 1 DAY  Final   Report Status PENDING  Incomplete  MRSA PCR Screening     Status: None   Collection Time: 03/05/16 10:47 AM  Result Value Ref Range Status   MRSA by PCR NEGATIVE NEGATIVE Final    Comment:        The GeneXpert MRSA Assay (FDA approved for NASAL specimens only), is one component of a comprehensive MRSA colonization surveillance program. It is not intended to diagnose MRSA infection nor to guide  or monitor treatment for MRSA infections.          Radiology Studies: Mr Foot Right Wo Contrast  Result Date: 03/06/2016 CLINICAL DATA:  Patient admitted to the hospital 03/04/2016 for fever and hypotension. Chronic wound on the right foot involving multiple toes. Dialysis patient. EXAM: MRI OF THE RIGHT FOREFOOT WITHOUT CONTRAST TECHNIQUE: Multiplanar, multisequence MR imaging was performed. No intravenous contrast was administered. COMPARISON:  Plain films of the right foot 03/05/2016. FINDINGS: The study is somewhat degraded by patient motion. Subcutaneous edema is present about the foot. A skin ulceration is seen along the distal aspect of the great toe on the medial side. At the level the distal phalanx, the ulcer extends almost to bone. No marrow edema in the great toe is identified. The patient appears to have a transverse fracture through the middle phalanx of the third toe which is nondisplaced. There is very mild marrow edema in the middle phalanx. No acute fracture is identified. First MTP osteoarthritis is seen. There is no focal fluid collection or mass. Intrinsic musculature the foot is atrophied. IMPRESSION: Large and deep ulceration along the medial aspect of the distal great toe without underlying abscess or evidence of osteomyelitis. Subcutaneous edema about the foot could be due to cellulitis or  dependent change. Subacute to remote appearing nondisplaced fracture middle phalanx of the third toe. First tarsometatarsal and first MTP joint osteoarthritis. Remote fracture base of the second metatarsal. Electronically Signed   By: Inge Rise [name]   On: 03/06/2016 07:35   Dg Chest Portable 1 View  Result Date: 03/05/2016 CLINICAL DATA:  Fever and generalized weakness.  Dialysis patient. EXAM: PORTABLE CHEST 1 VIEW COMPARISON:  Chest radiograph 06/09/2013. FINDINGS: The heart is enlarged. Dialysis catheter tip lies in the RIGHT atrium. Moderate vascular congestion without overt failure. Asymmetric opacity at the RIGHT base could represent early infiltrate or subsegmental atelectasis. No effusion or pneumothorax. Degenerative change RIGHT shoulder. IMPRESSION: Asymmetric opacity at the RIGHT base could represent early infiltrate or atelectasis. PA and lateral chest recommended when stable. Cardiomegaly with moderate vascular congestion. Electronically Signed   By: Staci Righter [name]   On: 03/05/2016 07:59   Dg Chest Port 1v Same Day  Result Date: 03/05/2016 CLINICAL DATA:  65 year old male status post central line placement. Initial encounter. EXAM: PORTABLE CHEST 1 VIEW COMPARISON:  0742 hours today and earlier. FINDINGS: Right IJ approach triple lumen appearing central line courses to the lower mediastinum at the cavoatrial junction level. Preexisting left IJ dual-lumen dialysis type catheter is unchanged. No pneumothorax. Stable lung volumes. Stable ventilation. Stable cardiomegaly and mediastinal contours. Calcified aortic atherosclerosis. Visualized tracheal air column is within normal limits. IMPRESSION: 1. Right IJ central line placed, tip at the cavoatrial junction level with no adverse features. 2. Otherwise stable chest. Electronically Signed   By: Genevie Ann [name]   On: 03/05/2016 13:49   Dg Foot 2 Views Right  Result Date: 03/05/2016 CLINICAL DATA:  Fever and generalized weakness with  oozing foot wounds EXAM: RIGHT FOOT - 2 VIEW COMPARISON:  02/12/2016 FINDINGS: Generalized osteopenia is again identified. Diffuse vascular calcifications are seen. No definitive bony erosive changes to suggest osteomyelitis are noted. Degenerative changes in the tarsal bones are again seen. Plantar calcaneal spurs are noted. IMPRESSION: Chronic changes without acute abnormality. Electronically Signed   By: Inez Catalina [name]   On: 03/05/2016 07:56        Scheduled Meds: . collagenase   Topical Daily  . insulin aspart  0-5 Units Subcutaneous QHS  . insulin aspart  0-9 Units Subcutaneous TID WC  . insulin aspart  10 Units Subcutaneous TID WC  . iron polysaccharides  150 mg Oral Daily  . multivitamin  1 tablet Oral QHS  . piperacillin-tazobactam (ZOSYN)  IV  3.375 g Intravenous Q12H  . sevelamer carbonate  1,600 mg Oral TID WC  . sodium chloride  1,000 mL Intravenous Once   And  . sodium chloride  1,000 mL Intravenous Once   And  . sodium chloride  1,000 mL Intravenous Once  . sodium chloride flush  3 mL Intravenous Q12H   Continuous Infusions:    LOS: 1 day    Time spent:46mins    Corrion Stirewalt, MD [facility] Pager [phone]  If 7PM-7AM, please contact night-coverage [email or web address] Password [email or web address] 03/06/2016, 10:17 AM

## 2016-03-07 LAB — GLUCOSE, CAPILLARY
Glucose-Capillary: 78 mg/dL (ref 65–99)
Glucose-Capillary: 88 mg/dL (ref 65–99)
Glucose-Capillary: 102 mg/dL — ABNORMAL HIGH (ref 65–99)
Glucose-Capillary: 92 mg/dL (ref 65–99)

## 2016-03-07 LAB — BASIC METABOLIC PANEL
Anion gap: 8 (ref 5–15)
BUN: 25 mg/dL — ABNORMAL HIGH (ref 6–20)
CO2: 28 mmol/L (ref 22–32)
Calcium: 8 mg/dL — ABNORMAL LOW (ref 8.9–10.3)
Chloride: 98 mmol/L — ABNORMAL LOW (ref 101–111)
Creatinine, Ser: 5.84 mg/dL — ABNORMAL HIGH (ref 0.61–1.24)
GFR calc Af Amer: 11 mL/min — ABNORMAL LOW (ref >60)
GFR calc non Af Amer: 9 mL/min — ABNORMAL LOW (ref >60)
Glucose, Bld: 93 mg/dL (ref 65–99)
Potassium: 3.9 mmol/L (ref 3.5–5.1)
Sodium: 134 mmol/L — ABNORMAL LOW (ref 135–145)

## 2016-03-07 LAB — CBC
HCT: 30.2 % — ABNORMAL LOW (ref 39.0–52.0)
Hemoglobin: 10.3 g/dL — ABNORMAL LOW (ref 13.0–17.0)
MCH: 27.8 pg (ref 26.0–34.0)
MCHC: 34.1 g/dL (ref 30.0–36.0)
MCV: 81.6 fL (ref 78.0–100.0)
Platelets: 226 10*3/uL (ref 150–400)
RBC: 3.7 MIL/uL — ABNORMAL LOW (ref 4.22–5.81)
RDW: 16.4 % — ABNORMAL HIGH (ref 11.5–15.5)
WBC: 12.7 10*3/uL — ABNORMAL HIGH (ref 4.0–10.5)

## 2016-03-07 LAB — PROTIME-INR
INR: 2.09
Prothrombin Time: 23.8 seconds — ABNORMAL HIGH (ref 11.4–15.2)

## 2016-03-07 MED ORDER — LIDOCAINE-PRILOCAINE 2.5-2.5 % EX CREA
1.0000 "application " | TOPICAL_CREAM | CUTANEOUS | Status: DC | PRN
  Filled 2016-03-07: qty 5

## 2016-03-07 MED ORDER — ALTEPLASE 2 MG IJ SOLR
2.0000 mg | Freq: Once | INTRAMUSCULAR | Status: DC | PRN
  Filled 2016-03-07: qty 2

## 2016-03-07 MED ORDER — WARFARIN SODIUM 7.5 MG PO TABS
7.5000 mg | ORAL_TABLET | Freq: Once | ORAL | Status: AC
  Administered 2016-03-07: 7.5 mg via ORAL
  Filled 2016-03-07: qty 1

## 2016-03-07 MED ORDER — LIDOCAINE HCL (PF) 1 % IJ SOLN: 5.0000 mL | INTRAMUSCULAR | Status: DC | PRN

## 2016-03-07 MED ORDER — SODIUM CHLORIDE 0.9 % IV SOLN: 100.0000 mL | INTRAVENOUS | Status: DC | PRN

## 2016-03-07 MED ORDER — VANCOMYCIN HCL IN DEXTROSE 1-5 GM/200ML-% IV SOLN
1000.0000 mg | Freq: Once | INTRAVENOUS | Status: AC
  Administered 2016-03-07: 1000 mg via INTRAVENOUS
  Filled 2016-03-07: qty 200

## 2016-03-07 MED ORDER — HEPARIN SODIUM (PORCINE) 1000 UNIT/ML DIALYSIS
1000.0000 [IU] | INTRAMUSCULAR | Status: DC | PRN
  Filled 2016-03-07: qty 1

## 2016-03-07 MED ORDER — PENTAFLUOROPROP-TETRAFLUOROETH EX AERO
1.0000 "application " | INHALATION_SPRAY | CUTANEOUS | Status: DC | PRN
  Filled 2016-03-07: qty 30

## 2016-03-07 NOTE — Progress Notes (Signed)
[facility] for COUMADIN (chronic Rx PTA) Indication: VTE prophylaxis, h/o DVT, chronic Coumadin from home  Allergies  Allergen Reactions  . No Known Allergies    Patient Measurements: Height: 6' 3.5" (191.8 cm) Weight: 262 lb 2 oz (118.9 kg) IBW/kg (Calculated) : 85.65  Vital Signs: Temp: 96.9 F (36.1 C) (10/07 0828) Temp Source: Oral (10/07 0828) BP: 123/46 (10/07 1000) Pulse Rate: 102 (10/07 1000)  Labs:  Recent Labs  03/05/16 0727 03/05/16 1134 03/05/16 1355 03/05/16 1554 03/06/16 0529 03/07/16 1000  HGB 10.6*  --   --  10.4* 10.3* 10.3*  HCT 31.7*  --   --  30.8* 29.9* 30.2*  PLT 217  --   --   --  201 226  APTT  --  52*  --   --   --   --   LABPROT 20.5* 22.5*  --   --  24.8* 23.8*  INR 1.73 1.94  --   --  2.20 2.09  HEPARINUNFRC  --   --  <0.10*  --   --   --   CREATININE 6.62*  --   --   --  7.25* 5.84*   Estimated Creatinine Clearance: 18.4 mL/min (by C-G formula based on SCr of 5.84 mg/dL (H)).  Medical History: Past Medical History:  Diagnosis Date  . Anemia   . Arthritis    HNP- lumbar, "all over my body"  . Blood transfusion    "years ago; blood was low" (08/05/2013)  . CKD (chronic kidney disease) stage 4, GFR 15-29 ml/min (HCC) 03/18/2012   [place]- T,TH,Sat.  . Diabetic nephropathy ([place])   . Diabetic retinopathy   . DVT (deep venous thrombosis) ([place])    "got one in my right leg now; I've had one before too, not sure which leg" (08/05/2013)  . ESRD (end stage renal disease) on dialysis [facility])    "just started today, (08/04/2013)"  . Family history of anesthesia complication    " my son wakes up slowly"  . GERD (gastroesophageal reflux disease)    uses alka seltzere on occas.   [name])    "one q now and then" (08/05/2013)  . Hyperlipidemia   . Hypertension   . 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  Non Hodgkin's lymphoma ([place])    Tx 2009; "had chemo; it went away" (08/05/2013)  . Noncompliance 03/16/2012  . NSVT (nonsustained ventricular tachycardia) ([place]) 03/18/2012  . Peripheral vascular disease ([place])   . Pneumonia 2013   hosp.-   . Poor historian    pt. unsure of several answers to health history questions   . Skin cancer    melanoma - head  . Sleep apnea    "suppose to have a sleep study, but they never told me when. (08/05/2013)   Medications:  Prescriptions Prior to Admission  Medication Sig Dispense Refill Last Dose  . acetaminophen (TYLENOL) 325 MG tablet Take 650 mg by mouth every 6 (six) hours as needed (pain).   Past Week at Unknown time  . amlodipine-benazepril (LOTREL) 2.5-10 MG capsule Take 1 capsule by mouth daily.   03/04/2016 at Unknown time  . diclofenac sodium (VOLTAREN) 1 % GEL Apply topically 4 (four) times daily.   Past Week at Unknown time  . ergocalciferol (VITAMIN D2) 50000 units capsule Take 50,000 Units by mouth once a week.  Past Week at Unknown time  . insulin glargine (LANTUS) 100 UNIT/ML injection Inject 0.3 mLs (30 Units total) into the skin at bedtime. 10 mL 11 Past Week at Unknown time  . insulin lispro (HUMALOG) 100 UNIT/ML injection Inject 10 Units into the skin 3 (three) times daily with meals.    03/04/2016 at Unknown time  . lidocaine-prilocaine (EMLA) cream Apply 1 application topically as needed (Apply small amount to access site 1-2 hours before dialysis. Cover with occlusive dressing (saran wrap)).    Past Month at Unknown time  . multivitamin (RENA-VIT) TABS tablet Take 1 tablet by mouth at bedtime. 30 tablet 0 Past Week at Unknown time  . oxyCODONE-acetaminophen (PERCOCET/ROXICET) 5-325 MG tablet Take 1 tablet by mouth every 6 (six) hours as needed for moderate pain. 30 tablet 0 03/04/2016 at Unknown time  . polysaccharide iron (NIFEREX) 150 MG CAPS capsule Take 1 capsule (150 mg total) by mouth daily. 30 each 2 Past Week at Unknown time  .  sevelamer carbonate (RENVELA) 800 MG tablet Take 1,600 mg by mouth 3 (three) times daily with meals.   Past Week at Unknown time  . warfarin (COUMADIN) 5 MG tablet Take 1.5 tablets (7.5 mg total) by mouth at bedtime. (Patient taking differently: Take 7.5-10 mg by mouth at bedtime. Take 1.5 tablets on Tuesday,Wed, and Thurs, Saturday, and Sunday.  Take 2 tablets on Monday and Friday) 30 tablet 1 03/04/2016 at Unknown time   Assessment: 65yo male on chronic Coumadin due to h/o DVT.  Pt's home dose listed above but reportedly dose has changed and pt not always compliant with ordered dose.  INR is therapeutic today despite no Coumadin given during this admission.  Pt has been on IV Heparin which has been stopped.  CBC appears stable.  No bleeding reported.  INR 2.09 therapeutic this AM  Goal of Therapy:  INR 2-3 Monitor platelets by anticoagulation protocol: Yes   Plan:  Coumadin 7.5mg  po today x 1 INR daily Monitor for s/sx of bleeding complications.    Abner Greenspan, Earlisha Sharples Bennett 03/07/2016,11:38 AM

## 2016-03-07 NOTE — Progress Notes (Signed)
Subjective: Interval History: Patient presently does not have any complaints except lack of sleep because of his dialysis. His appetite is good. Denies any fevers chills or sweating.  Objective: Vital signs in last 24 hours: Temp:  [96.7 F (35.9 C)-98.3 F (36.8 C)] 96.7 F (35.9 C) (10/07 0400) Pulse Rate:  [90-108] 97 (10/07 0800) Resp:  [8-31] 10 (10/07 0800) BP: (90-141)/(13-73) 114/44 (10/07 0800) SpO2:  [90 %-100 %] 94 % (10/07 0800) Weight change:   Intake/Output from previous day: 10/06 0701 - 10/07 0700 In: 170 [P.O.:120; IV Piggyback:50] Out: 2500  Intake/Output this shift: No intake/output data recorded.  General appearance: alert, cooperative and no distress Resp: clear to auscultation bilaterally Cardio: regular rate and rhythm GI: soft, non-tender; bowel sounds normal; no masses,  no organomegaly Extremities: venous stasis dermatitis noted and He has edema and possible gangrene and cellulitis of his toes.  Lab Results:  Recent Labs  03/05/16 0727 03/05/16 1554 03/06/16 0529  WBC 14.6*  --  13.5*  HGB 10.6* 10.4* 10.3*  HCT 31.7* 30.8* 29.9*  PLT 217  --  201   BMET:   Recent Labs  03/05/16 0727 03/06/16 0529  NA 134* 134*  K 4.7 4.0  CL 96* 100*  CO2 28 24  GLUCOSE 76 75  BUN 29* 36*  CREATININE 6.62* 7.25*  CALCIUM 8.5* 8.2*   No results for input([name]): PTH in the last 72 hours. Iron Studies: No results for input([name]): IRON, TIBC, TRANSFERRIN, FERRITIN in the last 72 hours.  Studies/Results: Mr Foot Right Wo Contrast  Result Date: 03/06/2016 CLINICAL DATA:  Patient admitted to the hospital 03/04/2016 for fever and hypotension. Chronic wound on the right foot involving multiple toes. Dialysis patient. EXAM: MRI OF THE RIGHT FOREFOOT WITHOUT CONTRAST TECHNIQUE: Multiplanar, multisequence MR imaging was performed. No intravenous contrast was administered. COMPARISON:  Plain films of the right foot 03/05/2016. FINDINGS: The study is somewhat  degraded by patient motion. Subcutaneous edema is present about the foot. A skin ulceration is seen along the distal aspect of the great toe on the medial side. At the level the distal phalanx, the ulcer extends almost to bone. No marrow edema in the great toe is identified. The patient appears to have a transverse fracture through the middle phalanx of the third toe which is nondisplaced. There is very mild marrow edema in the middle phalanx. No acute fracture is identified. First MTP osteoarthritis is seen. There is no focal fluid collection or mass. Intrinsic musculature the foot is atrophied. IMPRESSION: Large and deep ulceration along the medial aspect of the distal great toe without underlying abscess or evidence of osteomyelitis. Subcutaneous edema about the foot could be due to cellulitis or dependent change. Subacute to remote appearing nondisplaced fracture middle phalanx of the third toe. First tarsometatarsal and first MTP joint osteoarthritis. Remote fracture base of the second metatarsal. Electronically Signed   By: Inge Rise [name]   On: 03/06/2016 07:35   Dg Chest Port 1v Same Day  Result Date: 03/05/2016 CLINICAL DATA:  65 year old male status post central line placement. Initial encounter. EXAM: PORTABLE CHEST 1 VIEW COMPARISON:  0742 hours today and earlier. FINDINGS: Right IJ approach triple lumen appearing central line courses to the lower mediastinum at the cavoatrial junction level. Preexisting left IJ dual-lumen dialysis type catheter is unchanged. No pneumothorax. Stable lung volumes. Stable ventilation. Stable cardiomegaly and mediastinal contours. Calcified aortic atherosclerosis. Visualized tracheal air column is within normal limits. IMPRESSION: 1. Right IJ central line placed,  tip at the cavoatrial junction level with no adverse features. 2. Otherwise stable chest. Electronically Signed   By: Genevie Ann [name]   On: 03/05/2016 13:49    I have reviewed the patient'[name] current  medications.  Assessment/Plan: Problem #1 sepsis: Possibly from his foot. Patient remains on antibiotics. Presently he is afebrile. Problem #2 end-stage renal disease: He is status post hemodialysis this morning. Patient is asymptomatic. We are able to ultrafiltrate about 2-1/2 L. Problem #3 hypotension: Most likely from infection. His blood pressure at this moment has improved Problem #4 history of diabetes Problem #5 metabolic bone disease: His calcium is in range but phosphorus is not available.. Patient is on Renvela as a binder. Problem #6 anemia: His hemoglobin is within our target goal. Patient is on Epogen. Plan: 1] will continue his present management 2]We'll check his renal panel in the morning. .    LOS: 2 days   [name] 03/07/2016,8:27 AM

## 2016-03-07 NOTE — Progress Notes (Signed)
PROGRESS NOTE    MURPHY DUZAN  BLT:[id number] DOB: 1950/10/10 DOA: 03/05/2016 PCP: Geoffery Lyons, MD    Brief Narrative:  65 year old patient, end-stage renal disease on hemodialysis who was sent to the hospital for his dialysis unit for fever and hypotension. He was found to have sepsis which was felt to be related to infection of his right foot. He was started on broad-spectrum antibiotics. MRI does not show any evidence of osteomyelitis or abscess. Wound care has seen the patient. Will follow their instructions. Blood pressures are improving with IV fluids. Continue current treatments.   Assessment & Plan:   Active Problems:   Diabetes mellitus ([place])   History of DVT of lower extremity   Hypertension   End stage renal disease ([place])   Severe sepsis ([place])   Cellulitis of right foot   Sepsis ([place])   1. Sepsis. Source is likely his right foot. Without cough or shortness of breath, pneumonia would be less likely. Blood cultures have been sent and have not shown any growth yet. He is on vancomycin and Zosyn. He received fluids per sepsis protocol. Lactic acid was noted to be normal range. Hypotension has improved with IV fluids.  2. Cellulitis right foot. Initial plain films do not indicate any underlying osteomyelitis. MRI does not show any deep infections including abscess or osteomyelitis. Wound care has seen the patient. Wounds on toes are still draining and foul smelling. Continue intravenous antibiotics for now.  3. End-stage renal disease on hemodialysis. Nephrology following for dialysis needs  4. History of hypertension. Patient is on amlodipine/benazepril. Blood pressures are currently on the low side. Will continue to hold these medications for now.  5. History of DVT on Coumadin. Last INR >2. Will continue coumadin per pharmacy.  6. Diabetes. Having episodes of hypoglycemia. lantus currently on hold. Hold meal coverage novolog and continue sliding scale for now.  Blood sugars stable   DVT prophylaxis: coumadin Code Status: full code Family Communication: no family present Disposition Plan: discharge home with home health once stable.   Consultants:   Nephrology  WOC  Procedures:     Antimicrobials:   Vancomycin 10/5  Zosyn 10/5   Subjective: Had dialysis overnight. Complaining of some throbbing in his right foot. No shortness of breath or cough  Objective: Vitals:   03/07/16 0800 03/07/16 0828 03/07/16 0900 03/07/16 0925  BP: (!) 114/44  (!) 117/36   Pulse: 97  (!) 102   Resp: 10  17   Temp:  (!) 96.9 F (36.1 C)    TempSrc:  Oral    SpO2: 94%  98% 100%  Weight:      Height:        Intake/Output Summary (Last 24 hours) at 03/07/16 0945 Last data filed at 03/07/16 0630  Gross per 24 hour  Intake              170 ml  Output             2500 ml  Net            -2330 ml   Filed Weights   03/05/16 0730 03/05/16 1132  Weight: 116 kg (255 lb 11.7 oz) 118.9 kg (262 lb 2 oz)    Examination:  General exam: Appears calm and comfortable  Respiratory system: Clear to auscultation. Respiratory effort normal. Cardiovascular system: S1 & S2 heard, RRR. No JVD, murmurs, rubs, gallops or clicks. 2+ pedal edema R>L. Gastrointestinal system: Abdomen is nondistended, soft and nontender.  No organomegaly or masses felt. Normal bowel sounds heard. Central nervous system: Alert and oriented. No focal neurological deficits. Extremities: Symmetric 5 x 5 power. Skin: right foot is wrapped in dressings, wounds on toes are draining and are foul smelling Psychiatry: Judgement and insight appear normal. Mood & affect appropriate.     Data Reviewed: I have personally reviewed following labs and imaging studies  CBC:  Recent Labs Lab 03/05/16 0727 03/05/16 1554 03/06/16 0529  WBC 14.6*  --  13.5*  NEUTROABS 13.0*  --   --   HGB 10.6* 10.4* 10.3*  HCT 31.7* 30.8* 29.9*  MCV 84.3  --  83.3  PLT 217  --  630   Basic Metabolic  Panel:  Recent Labs Lab 03/05/16 0727 03/06/16 0529  NA 134* 134*  K 4.7 4.0  CL 96* 100*  CO2 28 24  GLUCOSE 76 75  BUN 29* 36*  CREATININE 6.62* 7.25*  CALCIUM 8.5* 8.2*   GFR: Estimated Creatinine Clearance: 14.8 mL/min (by C-G formula based on SCr of 7.25 mg/dL (H)). Liver Function Tests:  Recent Labs Lab 03/06/16 0529  AST 25  ALT 14*  ALKPHOS 136*  BILITOT 4.0*  PROT 6.3*  ALBUMIN 2.2*   No results for input(s): LIPASE, AMYLASE in the last 168 hours. No results for input(s): AMMONIA in the last 168 hours. Coagulation Profile:  Recent Labs Lab 03/05/16 0727 03/05/16 1134 03/06/16 0529  INR 1.73 1.94 2.20   Cardiac Enzymes: No results for input(s): CKTOTAL, CKMB, CKMBINDEX, TROPONINI in the last 168 hours. BNP (last 3 results) No results for input(s): PROBNP in the last 8760 hours. HbA1C: No results for input(s): HGBA1C in the last 72 hours. CBG:  Recent Labs Lab 03/06/16 0837 03/06/16 1121 03/06/16 1628 03/06/16 2156 03/07/16 0727  GLUCAP 105* 112* 125* 92 78   Lipid Profile: No results for input(s): CHOL, HDL, LDLCALC, TRIG, CHOLHDL, LDLDIRECT in the last 72 hours. Thyroid Function Tests: No results for input(s): TSH, T4TOTAL, FREET4, T3FREE, THYROIDAB in the last 72 hours. Anemia Panel: No results for input(s): VITAMINB12, FOLATE, FERRITIN, TIBC, IRON, RETICCTPCT in the last 72 hours. Sepsis Labs:  Recent Labs Lab 03/05/16 0740 03/05/16 1134 03/05/16 1355  PROCALCITON  --  8.00  --   LATICACIDVEN 1.80 1.2 0.9    Recent Results (from the past 240 hour(s))  Blood culture (routine x 2)     Status: None (Preliminary result)   Collection Time: 03/05/16  7:28 AM  Result Value Ref Range Status   Specimen Description BLOOD LEFT ARM  Final   Special Requests BOTTLES DRAWN AEROBIC ONLY 5CC  Final   Culture NO GROWTH 2 DAYS  Final   Report Status PENDING  Incomplete  Blood culture (routine x 2)     Status: None (Preliminary result)    Collection Time: 03/05/16  7:28 AM  Result Value Ref Range Status   Specimen Description BLOOD LEFT HAND  Final   Special Requests   Final    BOTTLES DRAWN AEROBIC AND ANAEROBIC AEB=5CC ANA=3CC   Culture NO GROWTH 2 DAYS  Final   Report Status PENDING  Incomplete  MRSA PCR Screening     Status: None   Collection Time: 03/05/16 10:47 AM  Result Value Ref Range Status   MRSA by PCR NEGATIVE NEGATIVE Final    Comment:        The GeneXpert MRSA Assay (FDA approved for NASAL specimens only), is one component of a comprehensive MRSA colonization surveillance program. It  is not intended to diagnose MRSA infection nor to guide or monitor treatment for MRSA infections.          Radiology Studies: Mr Foot Right Wo Contrast  Result Date: 03/06/2016 CLINICAL DATA:  Patient admitted to the hospital 03/04/2016 for fever and hypotension. Chronic wound on the right foot involving multiple toes. Dialysis patient. EXAM: MRI OF THE RIGHT FOREFOOT WITHOUT CONTRAST TECHNIQUE: Multiplanar, multisequence MR imaging was performed. No intravenous contrast was administered. COMPARISON:  Plain films of the right foot 03/05/2016. FINDINGS: The study is somewhat degraded by patient motion. Subcutaneous edema is present about the foot. A skin ulceration is seen along the distal aspect of the great toe on the medial side. At the level the distal phalanx, the ulcer extends almost to bone. No marrow edema in the great toe is identified. The patient appears to have a transverse fracture through the middle phalanx of the third toe which is nondisplaced. There is very mild marrow edema in the middle phalanx. No acute fracture is identified. First MTP osteoarthritis is seen. There is no focal fluid collection or mass. Intrinsic musculature the foot is atrophied. IMPRESSION: Large and deep ulceration along the medial aspect of the distal great toe without underlying abscess or evidence of osteomyelitis. Subcutaneous edema  about the foot could be due to cellulitis or dependent change. Subacute to remote appearing nondisplaced fracture middle phalanx of the third toe. First tarsometatarsal and first MTP joint osteoarthritis. Remote fracture base of the second metatarsal. Electronically Signed   By: Inge Rise [name]   On: 03/06/2016 07:35   Dg Chest Port 1v Same Day  Result Date: 03/05/2016 CLINICAL DATA:  65 year old male status post central line placement. Initial encounter. EXAM: PORTABLE CHEST 1 VIEW COMPARISON:  0742 hours today and earlier. FINDINGS: Right IJ approach triple lumen appearing central line courses to the lower mediastinum at the cavoatrial junction level. Preexisting left IJ dual-lumen dialysis type catheter is unchanged. No pneumothorax. Stable lung volumes. Stable ventilation. Stable cardiomegaly and mediastinal contours. Calcified aortic atherosclerosis. Visualized tracheal air column is within normal limits. IMPRESSION: 1. Right IJ central line placed, tip at the cavoatrial junction level with no adverse features. 2. Otherwise stable chest. Electronically Signed   By: Genevie Ann [name]   On: 03/05/2016 13:49        Scheduled Meds: . collagenase   Topical Daily  . insulin aspart  0-5 Units Subcutaneous QHS  . insulin aspart  0-9 Units Subcutaneous TID WC  . iron polysaccharides  150 mg Oral Daily  . multivitamin  1 tablet Oral QHS  . piperacillin-tazobactam (ZOSYN)  IV  3.375 g Intravenous Q12H  . sevelamer carbonate  1,600 mg Oral TID WC  . sodium chloride  1,000 mL Intravenous Once   And  . sodium chloride  1,000 mL Intravenous Once   And  . sodium chloride  1,000 mL Intravenous Once  . sodium chloride flush  3 mL Intravenous Q12H  . vancomycin  1,000 mg Intravenous Q M,W,F-HD  . Warfarin - Pharmacist Dosing Inpatient   Does not apply Q24H   Continuous Infusions:    LOS: 2 days    Time spent:17mins    MEMON,JEHANZEB, MD [facility] Pager [phone]  If 7PM-7AM,  please contact night-coverage [email or web address] Password [email or web address] 03/07/2016, 9:45 AM

## 2016-03-07 NOTE — Progress Notes (Signed)
Called report to [facility] RN on [phone] who will be getting Mr. Lasser in room 331.

## 2016-03-07 NOTE — Progress Notes (Addendum)
DIALYSIS NURSE IS HERE PREPARING TO START PT'S DIALYSIS TX.  RT FOOT DRSG HAS VISIBLE SS DRAINAGE. DISTAL RT FOOT AND ALL FIVE TOES CLEANED W/ NS. SANTANYL APPLIED TO LARGE AREA ON RT MEDIAL GREAT TOE. NS WET TO DRY APPLIED TO ENTIRE AREA. WRAPPED W/ DRY GAZE AND KLING.  FOUL SMELL FOR RT FOOT. SECOND,THIRD, FOURTH AND FIFTH TOES ARE BLACK DRY. BOTH BILATERAL LOWER LEGS ARE BLACK,TAUNT AND DRY.

## 2016-03-08 LAB — RENAL FUNCTION PANEL
Albumin: 2.1 g/dL — ABNORMAL LOW (ref 3.5–5.0)
Albumin: 2.2 g/dL — ABNORMAL LOW (ref 3.5–5.0)
Anion gap: 9 (ref 5–15)
Anion gap: 10 (ref 5–15)
BUN: 29 mg/dL — ABNORMAL HIGH (ref 6–20)
BUN: 32 mg/dL — ABNORMAL HIGH (ref 6–20)
CO2: 27 mmol/L (ref 22–32)
CO2: 27 mmol/L (ref 22–32)
Calcium: 8 mg/dL — ABNORMAL LOW (ref 8.9–10.3)
Calcium: 8.3 mg/dL — ABNORMAL LOW (ref 8.9–10.3)
Chloride: 96 mmol/L — ABNORMAL LOW (ref 101–111)
Chloride: 97 mmol/L — ABNORMAL LOW (ref 101–111)
Creatinine, Ser: 6.45 mg/dL — ABNORMAL HIGH (ref 0.61–1.24)
Creatinine, Ser: 6.69 mg/dL — ABNORMAL HIGH (ref 0.61–1.24)
GFR calc Af Amer: 10 mL/min — ABNORMAL LOW (ref >60)
GFR calc Af Amer: 9 mL/min — ABNORMAL LOW (ref >60)
GFR calc non Af Amer: 8 mL/min — ABNORMAL LOW (ref >60)
GFR calc non Af Amer: 8 mL/min — ABNORMAL LOW (ref >60)
Glucose, Bld: 78 mg/dL (ref 65–99)
Glucose, Bld: 78 mg/dL (ref 65–99)
Phosphorus: 3.3 mg/dL (ref 2.5–4.6)
Phosphorus: 3.3 mg/dL (ref 2.5–4.6)
Potassium: 3.9 mmol/L (ref 3.5–5.1)
Potassium: 4.1 mmol/L (ref 3.5–5.1)
Sodium: 132 mmol/L — ABNORMAL LOW (ref 135–145)
Sodium: 134 mmol/L — ABNORMAL LOW (ref 135–145)

## 2016-03-08 LAB — CBC
HCT: 30.6 % — ABNORMAL LOW (ref 39.0–52.0)
HCT: 32.7 % — ABNORMAL LOW (ref 39.0–52.0)
Hemoglobin: 10.3 g/dL — ABNORMAL LOW (ref 13.0–17.0)
Hemoglobin: 10.9 g/dL — ABNORMAL LOW (ref 13.0–17.0)
MCH: 27.7 pg (ref 26.0–34.0)
MCH: 27.6 pg (ref 26.0–34.0)
MCHC: 33.7 g/dL (ref 30.0–36.0)
MCHC: 33.3 g/dL (ref 30.0–36.0)
MCV: 82.3 fL (ref 78.0–100.0)
MCV: 82.8 fL (ref 78.0–100.0)
Platelets: 232 10*3/uL (ref 150–400)
Platelets: 255 10*3/uL (ref 150–400)
RBC: 3.72 MIL/uL — ABNORMAL LOW (ref 4.22–5.81)
RBC: 3.95 MIL/uL — ABNORMAL LOW (ref 4.22–5.81)
RDW: 16.4 % — ABNORMAL HIGH (ref 11.5–15.5)
RDW: 16.6 % — ABNORMAL HIGH (ref 11.5–15.5)
WBC: 11.4 10*3/uL — ABNORMAL HIGH (ref 4.0–10.5)
WBC: 10.8 10*3/uL — ABNORMAL HIGH (ref 4.0–10.5)

## 2016-03-08 LAB — GLUCOSE, CAPILLARY
Glucose-Capillary: 80 mg/dL (ref 65–99)
Glucose-Capillary: 93 mg/dL (ref 65–99)
Glucose-Capillary: 86 mg/dL (ref 65–99)
Glucose-Capillary: 94 mg/dL (ref 65–99)

## 2016-03-08 LAB — PROTIME-INR
INR: 2.6
Prothrombin Time: 28.3 seconds — ABNORMAL HIGH (ref 11.4–15.2)

## 2016-03-08 MED ORDER — WARFARIN SODIUM 5 MG PO TABS
5.0000 mg | ORAL_TABLET | Freq: Once | ORAL | Status: AC
Start: 2016-03-08 — End: 2016-03-08
  Administered 2016-03-08: 5 mg via ORAL
  Filled 2016-03-08: qty 1

## 2016-03-08 NOTE — Progress Notes (Signed)
PROGRESS NOTE    [name]  WRU:[id number] DOB: 1950/06/19 DOA: 03/05/2016 PCP: Geoffery Lyons, MD    Brief Narrative:  64-yom with a hx of end-stage renal disease on hemodialysis presented to hospital for dialysis was noted to be febrile and hypotensive. He was noted to be septic in the setting of infection of his right foot. He was started on broad-spectrum antibiotics. MRI does not show any evidence of osteomyelitis or abscess. He was admitted for further evaluation of sepsis.   Assessment & Plan:   Active Problems:   Diabetes mellitus ([place])   History of DVT of lower extremity   Hypertension   End stage renal disease ([place])   Severe sepsis ([place])   Cellulitis of right foot   Sepsis ([place])  1. Sepsis. Source is likely his right foot. Without cough or shortness of breath, pneumonia would be less likely. Blood cultures have been sent and have not shown any growth yet. He is on vancomycin and Zosyn. He received fluids per sepsis protocol. Lactic acid was noted to be normal range. Hypotension has improved with IV fluids. 2. Cellulitis right foot/leg. Initial plain films do not indicate any underlying osteomyelitis. MRI does not show any deep infections including abscess or osteomyelitis. Wound care has seen the patient. Wounds on toes are still draining and foul smelling. Continue intravenous antibiotics for now. General surgery consulted and has recommended checking segmental dopplers in AM. May need to have amputation. 3. End-stage renal disease on hemodialysis. Plan for dialysis tomorrow. Nephrology following.  4. History of hypertension. Patient is on amlodipine/benazepril. Blood pressures are currently on the low side. Will continue to hold these medications for now. 5. History of DVT on Coumadin. Last INR >2. Will continue coumadin. 6.  Diabetes. Having episodes of hypoglycemia. lantus currently on hold. Hold meal coverage novolog and continue sliding scale for now. Blood sugars  stable   DVT prophylaxis: Coumadin  Code Status: Full  Family Communication: discussed with son Legrand Como at the bedside Disposition Plan: Discharge home with [facility] once improved.    Consultants:   Nephrology   Wound care  General surgery   Procedures:   None   Antimicrobials:   Vancomycin 10/5 >>  Zosyn 10/5 >>    Subjective: No shortness of breath. No worsening pain in right foot  Objective: Vitals:   03/07/16 1300 03/07/16 1400 03/07/16 2117 03/08/16 0533  BP: (!) 103/57 (!) 104/37 (!) 101/27 (!) 100/57  Pulse: (!) 101 95 97 98  Resp: (!) 9 15 15 16   Temp:   97.7 F (36.5 C) 97.5 F (36.4 C)  TempSrc:   Oral Oral  SpO2: 94% 92% 95% 96%  Weight:    118.9 kg (262 lb 1.6 oz)  Height:        Intake/Output Summary (Last 24 hours) at 03/08/16 0643 Last data filed at 03/07/16 2233  Gross per 24 hour  Intake             1260 ml  Output                0 ml  Net             1260 ml   Filed Weights   03/05/16 0730 03/05/16 1132 03/08/16 0533  Weight: 116 kg (255 lb 11.7 oz) 118.9 kg (262 lb 2 oz) 118.9 kg (262 lb 1.6 oz)    Examination:  General exam: Appears calm and comfortable  Respiratory system: CTA B. Respiratory effort normal. Cardiovascular  system: S1 & S2 heard, RRR. No JVD, murmurs, rubs, gallops or clicks.  Gastrointestinal system: Abdomen is nondistended, soft and nontender. No organomegaly or masses felt. Normal bowel sounds heard. Central nervous system: Alert and oriented. No focal neurological deficits. Extremities: right lower extremity is warm and edematous, right foot continues to have foul smelling drainage Skin: wounds on right foot toes, draining foul smelling fluid Psychiatry: Judgement and insight appear normal. Mood & affect appropriate.     Data Reviewed: I have personally reviewed following labs and imaging studies  CBC:  Recent Labs Lab 03/05/16 0727 03/05/16 1554 03/06/16 0529 03/07/16 1000 03/08/16 0156  WBC 14.6*  --   13.5* 12.7* 11.4*  NEUTROABS 13.0*  --   --   --   --   HGB 10.6* 10.4* 10.3* 10.3* 10.3*  HCT 31.7* 30.8* 29.9* 30.2* 30.6*  MCV 84.3  --  83.3 81.6 82.3  PLT 217  --  201 226 702   Basic Metabolic Panel:  Recent Labs Lab 03/05/16 0727 03/06/16 0529 03/07/16 1000 03/08/16 0156  NA 134* 134* 134* 132*  K 4.7 4.0 3.9 3.9  CL 96* 100* 98* 96*  CO2 28 24 28 27   GLUCOSE 76 75 93 78  BUN 29* 36* 25* 29*  CREATININE 6.62* 7.25* 5.84* 6.45*  CALCIUM 8.5* 8.2* 8.0* 8.0*  PHOS  --   --   --  3.3   GFR: Estimated Creatinine Clearance: 16.6 mL/min (by C-G formula based on SCr of 6.45 mg/dL (H)). Liver Function Tests:  Recent Labs Lab 03/06/16 0529 03/08/16 0156  AST 25  --   ALT 14*  --   ALKPHOS 136*  --   BILITOT 4.0*  --   PROT 6.3*  --   ALBUMIN 2.2* 2.1*   No results for input(s): LIPASE, AMYLASE in the last 168 hours. No results for input(s): AMMONIA in the last 168 hours. Coagulation Profile:  Recent Labs Lab 03/05/16 0727 03/05/16 1134 03/06/16 0529 03/07/16 1000  INR 1.73 1.94 2.20 2.09   Cardiac Enzymes: No results for input(s): CKTOTAL, CKMB, CKMBINDEX, TROPONINI in the last 168 hours. BNP (last 3 results) No results for input(s): PROBNP in the last 8760 hours. HbA1C: No results for input(s): HGBA1C in the last 72 hours. CBG:  Recent Labs Lab 03/06/16 2156 03/07/16 0727 03/07/16 1126 03/07/16 1623 03/07/16 2124  GLUCAP 92 78 88 102* 92   Lipid Profile: No results for input(s): CHOL, HDL, LDLCALC, TRIG, CHOLHDL, LDLDIRECT in the last 72 hours. Thyroid Function Tests: No results for input(s): TSH, T4TOTAL, FREET4, T3FREE, THYROIDAB in the last 72 hours. Anemia Panel: No results for input(s): VITAMINB12, FOLATE, FERRITIN, TIBC, IRON, RETICCTPCT in the last 72 hours. Sepsis Labs:  Recent Labs Lab 03/05/16 0740 03/05/16 1134 03/05/16 1355  PROCALCITON  --  8.00  --   LATICACIDVEN 1.80 1.2 0.9    Recent Results (from the past 240  hour(s))  Blood culture (routine x 2)     Status: None (Preliminary result)   Collection Time: 03/05/16  7:28 AM  Result Value Ref Range Status   Specimen Description BLOOD LEFT ARM  Final   Special Requests BOTTLES DRAWN AEROBIC ONLY 5CC  Final   Culture NO GROWTH 3 DAYS  Final   Report Status PENDING  Incomplete  Blood culture (routine x 2)     Status: None (Preliminary result)   Collection Time: 03/05/16  7:28 AM  Result Value Ref Range Status   Specimen Description BLOOD LEFT  HAND  Final   Special Requests   Final    BOTTLES DRAWN AEROBIC AND ANAEROBIC AEB=5CC ANA=3CC   Culture NO GROWTH 3 DAYS  Final   Report Status PENDING  Incomplete  MRSA PCR Screening     Status: None   Collection Time: 03/05/16 10:47 AM  Result Value Ref Range Status   MRSA by PCR NEGATIVE NEGATIVE Final    Comment:        The GeneXpert MRSA Assay (FDA approved for NASAL specimens only), is one component of a comprehensive MRSA colonization surveillance program. It is not intended to diagnose MRSA infection nor to guide or monitor treatment for MRSA infections.          Radiology Studies: No results found.      Scheduled Meds: . collagenase   Topical Daily  . insulin aspart  0-5 Units Subcutaneous QHS  . insulin aspart  0-9 Units Subcutaneous TID WC  . iron polysaccharides  150 mg Oral Daily  . multivitamin  1 tablet Oral QHS  . piperacillin-tazobactam (ZOSYN)  IV  3.375 g Intravenous Q12H  . sevelamer carbonate  1,600 mg Oral TID WC  . sodium chloride  1,000 mL Intravenous Once   And  . sodium chloride  1,000 mL Intravenous Once   And  . sodium chloride  1,000 mL Intravenous Once  . sodium chloride flush  3 mL Intravenous Q12H  . vancomycin  1,000 mg Intravenous Q M,W,F-HD  . Warfarin - Pharmacist Dosing Inpatient   Does not apply Q24H   Continuous Infusions:    LOS: 3 days   Time spent: 25 minutes     Kathie Dike, MD [facility] If 7PM-7AM, please contact  night-coverage [email or web address] Password [facility] 03/08/2016, 6:43 AM

## 2016-03-08 NOTE — Progress Notes (Signed)
Subjective: Interval History: Patient is denies any nausea or vomiting. Patient also does not have any difficulty in breathing and  Objective: Vital signs in last 24 hours: Temp:  [97 F (36.1 C)-97.7 F (36.5 C)] 97.5 F (36.4 C) (10/08 0533) Pulse Rate:  [95-103] 98 (10/08 0533) Resp:  [9-30] 16 (10/08 0533) BP: (100-123)/(27-57) 100/57 (10/08 0533) SpO2:  [92 %-100 %] 96 % (10/08 0533) Weight:  [118.9 kg (262 lb 1.6 oz)] 118.9 kg (262 lb 1.6 oz) (10/08 0533) Weight change:   Intake/Output from previous day: 10/07 0701 - 10/08 0700 In: 1260 [P.O.:960; IV Piggyback:300] Out: -  Intake/Output this shift: No intake/output data recorded.  General appearance: alert, cooperative and no distress Resp: clear to auscultation bilaterally Cardio: regular rate and rhythm GI: soft, non-tender; bowel sounds normal; no masses,  no organomegaly Extremities: venous stasis dermatitis noted and He has edema and possible gangrene and cellulitis of his toes.  Lab Results:  Recent Labs  03/08/16 0156 03/08/16 0726  WBC 11.4* 10.8*  HGB 10.3* 10.9*  HCT 30.6* 32.7*  PLT 232 255   BMET:   Recent Labs  03/08/16 0156 03/08/16 0726  NA 132* 134*  K 3.9 4.1  CL 96* 97*  CO2 27 27  GLUCOSE 78 78  BUN 29* 32*  CREATININE 6.45* 6.69*  CALCIUM 8.0* 8.3*   No results for input(s): PTH in the last 72 hours. Iron Studies: No results for input(s): IRON, TIBC, TRANSFERRIN, FERRITIN in the last 72 hours.  Studies/Results: No results found.  I have reviewed the patient's current medications.  Assessment/Plan: Problem #1 sepsis: Possibly from his foot. Patient remains on antibiotics. Presently he is afebrile. Problem #2 end-stage renal disease: He is status post hemodialysis Yesterday. His potassium is normal. And no sign of fluid overload Problem #3 hypotension: Most likely from infection. His blood pressure at this moment has improved Problem #4 history of diabetes: His blood sugar is  reasonably controlled  Problem #5 metabolic bone disease: His calcium And phosphorus is range. Problem #6 anemia: His hemoglobin is within our target goal. Patient is on Epogen. Plan: 1] will continue his present management 2]We'll check his renal panel in the morning. 3] we'll make arrangements for patient to get dialysis tomorrow. This is his regular schedule. The patient is going to be discharged to be done as an outpatient. .    LOS: 3 days   Dulcey Riederer S 03/08/2016,8:56 AM

## 2016-03-08 NOTE — Consult Note (Signed)
Reason for Consult: Cellulitis, right lower extremity Referring Physician: Dr. Telford Nab [name] is an 65 y.o. male.  HPI: Patient is a 65 year old black male multiple medical problems who presented to [facility] for workup of sepsis. He was noted on admission to have gangrenous changes in the toes of the right foot. He states this is being followed by podiatry. He denies any significant right foot pain. His sepsis responded quickly to fluid bolus. I was asked to see the patient concerning his right lower extremity. Patient denies any pain. He appears to be somewhat poor historian.  Past Medical History:  Diagnosis Date  . Anemia   . Arthritis    HNP- lumbar, "all over my body"  . Blood transfusion    "years ago; blood was low" (08/05/2013)  . CKD (chronic kidney disease) stage 4, GFR 15-29 ml/min (HCC) 03/18/2012   [place]- T,TH,Sat.  . Diabetic nephropathy ([place])   . Diabetic retinopathy   . DVT (deep venous thrombosis) ([place])    "got one in my right leg now; I've had one before too, not sure which leg" (08/05/2013)  . ESRD (end stage renal disease) on dialysis [facility])    "just started today, (08/04/2013)"  . Family history of anesthesia complication    " my son wakes up slowly"  . GERD (gastroesophageal reflux disease)    uses alka seltzere on occas.   [name])    "one q now and then" (08/05/2013)  . Hyperlipidemia   . Hypertension   . 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 Non Hodgkin's lymphoma ([place])    Tx 2009; "had chemo; it went away" (08/05/2013)  . Noncompliance 03/16/2012  . NSVT (nonsustained ventricular tachycardia) ([place]) 03/18/2012  . Peripheral vascular disease ([place])   . Pneumonia 2013   hosp.-   . Poor historian    pt. unsure of several answers to health history questions   . Skin cancer    melanoma - head  . Sleep apnea    "suppose to have a sleep  study, but they never told me when. (08/05/2013)    Past Surgical History:  Procedure Laterality Date  . AV FISTULA PLACEMENT Left 02/03/2013   Procedure: ARTERIOVENOUS (AV) FISTULA CREATION- LEFT RADIAL CEPHALIC; ULTRASOUND GUIDED;  Surgeon: Mal Misty, MD;  Location[facility];  Service: Vascular;  Laterality: Left;  . AV FISTULA PLACEMENT Right 11/08/2015   Procedure: RIGHT BRACHIOCEPHALIC ARTERIOVENOUS (AV) FISTULA CREATION;  Surgeon: Serafina Mitchell, MD;  Location: [place];  Service: Vascular;  Laterality: Right;  . BASCILIC VEIN TRANSPOSITION Right 01/24/2016   Procedure: RIGHT SECOND STAGE BASILIC VEIN TRANSPOSITION;  Surgeon: Angelia Mould, MD;  Location: [place];  Service: Vascular;  Laterality: Right;  . CARDIAC CATHETERIZATION    . COLONOSCOPY N/A 09/23/2015   Procedure: COLONOSCOPY;  Surgeon: Irene Shipper, MD;  Location: [facility];  Service: Endoscopy;  Laterality: N/A;  . Coloscopy    . EYE SURGERY Bilateral   . GAS INSERTION  05/10/2012   Procedure: INSERTION OF GAS;  Surgeon: Hayden Pedro, MD;  Location: [place];  Service: Ophthalmology;  Laterality: Right;  . LESION EXCISION Right 05/08/2015   Procedure: EXCISION SCALP LESION;  Surgeon: Erroll Luna, MD;  Location: [place];  Service: General;  Laterality: Right;  . MEMBRANE PEEL  05/10/2012   Procedure: MEMBRANE PEEL;  Surgeon: Hayden Pedro,  MD;  Location: [place];  Service: Ophthalmology;  Laterality: Right;  . PARS PLANA VITRECTOMY  08/27/2011   Procedure: PARS PLANA VITRECTOMY WITH 25 GAUGE;  Surgeon: Hayden Pedro, MD;  Location: [place];  Service: Ophthalmology;  Laterality: Left;  Repair of complex traction retinal detachment left eye  . PARS PLANA VITRECTOMY  05/10/2012   Procedure: PARS PLANA VITRECTOMY WITH 25 GAUGE;  Surgeon: Hayden Pedro, MD;  Location: [place];  Service: Ophthalmology;  Laterality: Right;  Repair Complex Traction Retinal Detachment  . PHOTOCOAGULATION WITH LASER  05/10/2012   Procedure:  PHOTOCOAGULATION WITH LASER;  Surgeon: Hayden Pedro, MD;  Location: [place];  Service: Ophthalmology;  Laterality: Right;  . PORT-A-CATH REMOVAL    . PORTACATH PLACEMENT    . VENA CAVA FILTER PLACEMENT  09/2012   due to preparation for surgery    Family History  Problem Relation Age of Onset  . Anesthesia problems Son   . Hypertension Son   . Diabetes Father   . Hypertension Father   . Other Father     amputation  . Colon cancer Neg Hx     Social History:  reports that he has never smoked. He has never used smokeless tobacco. He reports that he does not drink alcohol or use drugs.  Allergies:  Allergies  Allergen Reactions  . No Known Allergies     Medications:  Prior to Admission:  Prescriptions Prior to Admission  Medication Sig Dispense Refill Last Dose  . acetaminophen (TYLENOL) 325 MG tablet Take 650 mg by mouth every 6 (six) hours as needed (pain).   Past Week at Unknown time  . amlodipine-benazepril (LOTREL) 2.5-10 MG capsule Take 1 capsule by mouth daily.   03/04/2016 at Unknown time  . diclofenac sodium (VOLTAREN) 1 % GEL Apply topically 4 (four) times daily.   Past Week at Unknown time  . ergocalciferol (VITAMIN D2) 50000 units capsule Take 50,000 Units by mouth once a week.   Past Week at Unknown time  . insulin glargine (LANTUS) 100 UNIT/ML injection Inject 0.3 mLs (30 Units total) into the skin at bedtime. 10 mL 11 Past Week at Unknown time  . insulin lispro (HUMALOG) 100 UNIT/ML injection Inject 10 Units into the skin 3 (three) times daily with meals.    03/04/2016 at Unknown time  . lidocaine-prilocaine (EMLA) cream Apply 1 application topically as needed (Apply small amount to access site 1-2 hours before dialysis. Cover with occlusive dressing (saran wrap)).    Past Month at Unknown time  . multivitamin (RENA-VIT) TABS tablet Take 1 tablet by mouth at bedtime. 30 tablet 0 Past Week at Unknown time  . oxyCODONE-acetaminophen (PERCOCET/ROXICET) 5-325 MG tablet Take  1 tablet by mouth every 6 (six) hours as needed for moderate pain. 30 tablet 0 03/04/2016 at Unknown time  . polysaccharide iron (NIFEREX) 150 MG CAPS capsule Take 1 capsule (150 mg total) by mouth daily. 30 each 2 Past Week at Unknown time  . sevelamer carbonate (RENVELA) 800 MG tablet Take 1,600 mg by mouth 3 (three) times daily with meals.   Past Week at Unknown time  . warfarin (COUMADIN) 5 MG tablet Take 1.5 tablets (7.5 mg total) by mouth at bedtime. (Patient taking differently: Take 7.5-10 mg by mouth at bedtime. Take 1.5 tablets on Tuesday,Wed, and Thurs, Saturday, and Sunday.  Take 2 tablets on Monday and Friday) 30 tablet 1 03/04/2016 at Unknown time   Scheduled: . collagenase   Topical Daily  .  insulin aspart  0-5 Units Subcutaneous QHS  . insulin aspart  0-9 Units Subcutaneous TID WC  . iron polysaccharides  150 mg Oral Daily  . multivitamin  1 tablet Oral QHS  . piperacillin-tazobactam (ZOSYN)  IV  3.375 g Intravenous Q12H  . sevelamer carbonate  1,600 mg Oral TID WC  . sodium chloride  1,000 mL Intravenous Once   And  . sodium chloride  1,000 mL Intravenous Once   And  . sodium chloride  1,000 mL Intravenous Once  . sodium chloride flush  3 mL Intravenous Q12H  . vancomycin  1,000 mg Intravenous Q M,W,F-HD  . warfarin  5 mg Oral Once  . Warfarin - Pharmacist Dosing Inpatient   Does not apply Q24H    Results for orders placed or performed during the hospital encounter of 03/05/16 (from the past 48 hour(s))  Glucose, capillary     Status: Abnormal   Collection Time: 03/06/16  4:28 PM  Result Value Ref Range   Glucose-Capillary 125 (H) 65 - 99 mg/dL   Comment 1 Notify RN    Comment 2 Document in Chart   Glucose, capillary     Status: None   Collection Time: 03/06/16  9:56 PM  Result Value Ref Range   Glucose-Capillary 92 65 - 99 mg/dL   Comment 1 Notify RN    Comment 2 Document in Chart   Glucose, capillary     Status: None   Collection Time: 03/07/16  7:27 AM  Result  Value Ref Range   Glucose-Capillary 78 65 - 99 mg/dL   Comment 1 Notify RN    Comment 2 Document in Chart   Basic metabolic panel     Status: Abnormal   Collection Time: 03/07/16 10:00 AM  Result Value Ref Range   Sodium 134 (L) 135 - 145 mmol/L   Potassium 3.9 3.5 - 5.1 mmol/L   Chloride 98 (L) 101 - 111 mmol/L   CO2 28 22 - 32 mmol/L   Glucose, Bld 93 65 - 99 mg/dL   BUN 25 (H) 6 - 20 mg/dL   Creatinine, Ser 5.84 (H) 0.61 - 1.24 mg/dL   Calcium 8.0 (L) 8.9 - 10.3 mg/dL   GFR calc non Af Amer 9 (L) >60 mL/min   GFR calc Af Amer 11 (L) >60 mL/min    Comment: (NOTE) The eGFR has been calculated using the CKD EPI equation. This calculation has not been validated in all clinical situations. eGFR's persistently <60 mL/min signify possible Chronic Kidney Disease.    Anion gap 8 5 - 15  CBC     Status: Abnormal   Collection Time: 03/07/16 10:00 AM  Result Value Ref Range   WBC 12.7 (H) 4.0 - 10.5 K/uL   RBC 3.70 (L) 4.22 - 5.81 MIL/uL   Hemoglobin 10.3 (L) 13.0 - 17.0 g/dL   HCT 30.2 (L) 39.0 - 52.0 %   MCV 81.6 78.0 - 100.0 fL   MCH 27.8 26.0 - 34.0 pg   MCHC 34.1 30.0 - 36.0 g/dL   RDW 16.4 (H) 11.5 - 15.5 %   Platelets 226 150 - 400 K/uL  Protime-INR     Status: Abnormal   Collection Time: 03/07/16 10:00 AM  Result Value Ref Range   Prothrombin Time 23.8 (H) 11.4 - 15.2 seconds   INR 2.09   Glucose, capillary     Status: None   Collection Time: 03/07/16 11:26 AM  Result Value Ref Range   Glucose-Capillary 88 65 -  99 mg/dL   Comment 1 Notify RN    Comment 2 Document in Chart   Glucose, capillary     Status: Abnormal   Collection Time: 03/07/16  4:23 PM  Result Value Ref Range   Glucose-Capillary 102 (H) 65 - 99 mg/dL   Comment 1 Notify RN    Comment 2 Document in Chart   Glucose, capillary     Status: None   Collection Time: 03/07/16  9:24 PM  Result Value Ref Range   Glucose-Capillary 92 65 - 99 mg/dL  Renal function panel     Status: Abnormal   Collection Time:  03/08/16  1:56 AM  Result Value Ref Range   Sodium 132 (L) 135 - 145 mmol/L   Potassium 3.9 3.5 - 5.1 mmol/L   Chloride 96 (L) 101 - 111 mmol/L   CO2 27 22 - 32 mmol/L   Glucose, Bld 78 65 - 99 mg/dL   BUN 29 (H) 6 - 20 mg/dL   Creatinine, Ser 6.45 (H) 0.61 - 1.24 mg/dL   Calcium 8.0 (L) 8.9 - 10.3 mg/dL   Phosphorus 3.3 2.5 - 4.6 mg/dL   Albumin 2.1 (L) 3.5 - 5.0 g/dL   GFR calc non Af Amer 8 (L) >60 mL/min   GFR calc Af Amer 10 (L) >60 mL/min    Comment: (NOTE) The eGFR has been calculated using the CKD EPI equation. This calculation has not been validated in all clinical situations. eGFR's persistently <60 mL/min signify possible Chronic Kidney Disease.    Anion gap 9 5 - 15  CBC     Status: Abnormal   Collection Time: 03/08/16  1:56 AM  Result Value Ref Range   WBC 11.4 (H) 4.0 - 10.5 K/uL   RBC 3.72 (L) 4.22 - 5.81 MIL/uL   Hemoglobin 10.3 (L) 13.0 - 17.0 g/dL   HCT 30.6 (L) 39.0 - 52.0 %   MCV 82.3 78.0 - 100.0 fL   MCH 27.7 26.0 - 34.0 pg   MCHC 33.7 30.0 - 36.0 g/dL   RDW 16.4 (H) 11.5 - 15.5 %   Platelets 232 150 - 400 K/uL  Protime-INR     Status: Abnormal   Collection Time: 03/08/16  7:26 AM  Result Value Ref Range   Prothrombin Time 28.3 (H) 11.4 - 15.2 seconds   INR 2.60   CBC     Status: Abnormal   Collection Time: 03/08/16  7:26 AM  Result Value Ref Range   WBC 10.8 (H) 4.0 - 10.5 K/uL   RBC 3.95 (L) 4.22 - 5.81 MIL/uL   Hemoglobin 10.9 (L) 13.0 - 17.0 g/dL   HCT 32.7 (L) 39.0 - 52.0 %   MCV 82.8 78.0 - 100.0 fL   MCH 27.6 26.0 - 34.0 pg   MCHC 33.3 30.0 - 36.0 g/dL   RDW 16.6 (H) 11.5 - 15.5 %   Platelets 255 150 - 400 K/uL  Renal function panel     Status: Abnormal   Collection Time: 03/08/16  7:26 AM  Result Value Ref Range   Sodium 134 (L) 135 - 145 mmol/L   Potassium 4.1 3.5 - 5.1 mmol/L   Chloride 97 (L) 101 - 111 mmol/L   CO2 27 22 - 32 mmol/L   Glucose, Bld 78 65 - 99 mg/dL   BUN 32 (H) 6 - 20 mg/dL   Creatinine, Ser 6.69 (H) 0.61 -  1.24 mg/dL   Calcium 8.3 (L) 8.9 - 10.3 mg/dL   Phosphorus 3.3  2.5 - 4.6 mg/dL   Albumin 2.2 (L) 3.5 - 5.0 g/dL   GFR calc non Af Amer 8 (L) >60 mL/min   GFR calc Af Amer 9 (L) >60 mL/min    Comment: (NOTE) The eGFR has been calculated using the CKD EPI equation. This calculation has not been validated in all clinical situations. eGFR's persistently <60 mL/min signify possible Chronic Kidney Disease.    Anion gap 10 5 - 15  Glucose, capillary     Status: None   Collection Time: 03/08/16  7:36 AM  Result Value Ref Range   Glucose-Capillary 80 65 - 99 mg/dL   Comment 1 Notify RN    Comment 2 Document in Chart   Glucose, capillary     Status: None   Collection Time: 03/08/16 11:48 AM  Result Value Ref Range   Glucose-Capillary 93 65 - 99 mg/dL   Comment 1 Notify RN    Comment 2 Document in Chart     No results found.  ROS:  Review of systems not obtained due to patient factors.  Blood pressure (!) 100/57, pulse 98, temperature 97.5 F (36.4 C), temperature source Oral, resp. rate 16, height 6' 3.5" (1.918 m), weight 118.9 kg (262 lb 1.6 oz), SpO2 96 %. Physical Exam: Pleasant black male in no acute distress. Respiratory: Lungs clear to auscultation with breath sounds bilaterally. Head is normocephalic, atraumatic. Heart examination reveals no S3, S4, or significant murmurs. Extremity examination reveals hairless skin bilaterally. There is bilateral palpable femoral pulses present. Below the knee, the soft tissue appears a somewhat tests and ruborous. The right foot is swollen and precluded any palpable dorsalis pedis or posterior tibial pulse. Multiple areas of skin breakdown are noted on the toes. There are some gangrenous changes noted in the toes.   Assessment/Plan: Impression: Multiple medical problems, cellulitis right lower extremity is likely secondary to peripheral vascular disease.  Plan: Continue antibiotic therapy. Will get segmental Dopplers tomorrow. I did tell  patient that mainly on the right above-the-knee amputation in the future. Will follow with you.  Tidus Upchurch A 03/08/2016, 12:37 PM

## 2016-03-08 NOTE — Progress Notes (Signed)
[place] for COUMADIN (chronic Rx PTA) Indication: VTE prophylaxis, h/o DVT, chronic Coumadin from home  Allergies  Allergen Reactions  . No Known Allergies    Patient Measurements: Height: 6' 3.5" (191.8 cm) Weight: 262 lb 1.6 oz (118.9 kg) IBW/kg (Calculated) : 85.65  Vital Signs: Temp: 97.5 F (36.4 C) (10/08 0533) Temp Source: Oral (10/08 0533) BP: 100/57 (10/08 0533) Pulse Rate: 98 (10/08 0533)  Labs:  Recent Labs  03/05/16 1134 03/05/16 1355  03/06/16 0529 03/07/16 1000 03/08/16 0156 03/08/16 0726  HGB  --   --   < > 10.3* 10.3* 10.3* 10.9*  HCT  --   --   < > 29.9* 30.2* 30.6* 32.7*  PLT  --   --   < > 201 226 232 255  APTT 52*  --   --   --   --   --   --   LABPROT 22.5*  --   --  24.8* 23.8*  --  28.3*  INR 1.94  --   --  2.20 2.09  --  2.60  HEPARINUNFRC  --  <0.10*  --   --   --   --   --   CREATININE  --   --   < > 7.25* 5.84* 6.45* 6.69*  < > = values in this interval not displayed. Estimated Creatinine Clearance: 16 mL/min (by C-G formula based on SCr of 6.69 mg/dL (H)).  Medical History: Past Medical History:  Diagnosis Date  . Anemia   . Arthritis    HNP- lumbar, "all over my body"  . Blood transfusion    "years ago; blood was low" (08/05/2013)  . CKD (chronic kidney disease) stage 4, GFR 15-29 ml/min (HCC) 03/18/2012   [place]- T,TH,Sat.  . Diabetic nephropathy ([place])   . Diabetic retinopathy   . DVT (deep venous thrombosis) ([place])    "got one in my right leg now; I've had one before too, not sure which leg" (08/05/2013)  . ESRD (end stage renal disease) on dialysis [facility])    "just started today, (08/04/2013)"  . Family history of anesthesia complication    " my son wakes up slowly"  . GERD (gastroesophageal reflux disease)    uses alka seltzere on occas.   [name])    "one q now and then" (08/05/2013)  . Hyperlipidemia   . Hypertension   . 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 Nodular lymphoma of intra-abdominal lymph nodes ([place])   . Non Hodgkin's lymphoma ([place])    Tx 2009; "had chemo; it went away" (08/05/2013)  . Noncompliance 03/16/2012  . NSVT (nonsustained ventricular tachycardia) ([place]) 03/18/2012  . Peripheral vascular disease ([place])   . Pneumonia 2013   hosp.-   . Poor historian    pt. unsure of several answers to health history questions   . Skin cancer    melanoma - head  . Sleep apnea    "suppose to have a sleep study, but they never told me when. (08/05/2013)   Medications:  Prescriptions Prior to Admission  Medication Sig Dispense Refill Last Dose  . acetaminophen (TYLENOL) 325 MG tablet Take 650 mg by mouth every 6 (six) hours as needed (pain).   Past Week at Unknown time  . amlodipine-benazepril (LOTREL) 2.5-10 MG capsule Take 1 capsule by mouth daily.   03/04/2016 at Unknown time  . diclofenac sodium (VOLTAREN) 1 % GEL Apply topically  4 (four) times daily.   Past Week at Unknown time  . ergocalciferol (VITAMIN D2) 50000 units capsule Take 50,000 Units by mouth once a week.   Past Week at Unknown time  . insulin glargine (LANTUS) 100 UNIT/ML injection Inject 0.3 mLs (30 Units total) into the skin at bedtime. 10 mL 11 Past Week at Unknown time  . insulin lispro (HUMALOG) 100 UNIT/ML injection Inject 10 Units into the skin 3 (three) times daily with meals.    03/04/2016 at Unknown time  . lidocaine-prilocaine (EMLA) cream Apply 1 application topically as needed (Apply small amount to access site 1-2 hours before dialysis. Cover with occlusive dressing (saran wrap)).    Past Month at Unknown time  . multivitamin (RENA-VIT) TABS tablet Take 1 tablet by mouth at bedtime. 30 tablet 0 Past Week at Unknown time  . oxyCODONE-acetaminophen (PERCOCET/ROXICET) 5-325 MG tablet Take 1 tablet by mouth every 6 (six) hours as needed for moderate pain. 30 tablet 0 03/04/2016 at Unknown time  . polysaccharide iron (NIFEREX) 150 MG CAPS capsule  Take 1 capsule (150 mg total) by mouth daily. 30 each 2 Past Week at Unknown time  . sevelamer carbonate (RENVELA) 800 MG tablet Take 1,600 mg by mouth 3 (three) times daily with meals.   Past Week at Unknown time  . warfarin (COUMADIN) 5 MG tablet Take 1.5 tablets (7.5 mg total) by mouth at bedtime. (Patient taking differently: Take 7.5-10 mg by mouth at bedtime. Take 1.5 tablets on Tuesday,Wed, and Thurs, Saturday, and Sunday.  Take 2 tablets on Monday and Friday) 30 tablet 1 03/04/2016 at Unknown time   Assessment: 65yo male on chronic Coumadin due to h/o DVT.  Pt's home dose listed above but reportedly dose has changed and pt not always compliant with ordered dose.  INR is therapeutic today despite no Coumadin given during this admission.  Pt has been on IV Heparin which has been stopped.  CBC appears stable.  No bleeding reported.  INR therapeutic this AM (2.6 up from 2.09)  Goal of Therapy:  INR 2-3 Monitor platelets by anticoagulation protocol: Yes   Plan:  Coumadin 5 po today x 1 INR daily Monitor for s/sx of bleeding complications.    Abner Greenspan, Sarie Stall Bennett 03/08/2016,9:14 AM

## 2016-03-08 NOTE — Progress Notes (Signed)
PT has a headache from lack of rest. Tylenol provided. PT does not want to do wound care at this time. He is wanting to rest. Continue to monitor

## 2016-03-09 ENCOUNTER — Ambulatory Visit: Payer: Medicare Other | Admitting: Podiatry

## 2016-03-09 ENCOUNTER — Inpatient Hospital Stay (HOSPITAL_COMMUNITY): Payer: Medicare Other

## 2016-03-09 LAB — CBC
HCT: 30.7 % — ABNORMAL LOW (ref 39.0–52.0)
Hemoglobin: 10.5 g/dL — ABNORMAL LOW (ref 13.0–17.0)
MCH: 27.9 pg (ref 26.0–34.0)
MCHC: 34.2 g/dL (ref 30.0–36.0)
MCV: 81.4 fL (ref 78.0–100.0)
Platelets: 245 10*3/uL (ref 150–400)
RBC: 3.77 MIL/uL — ABNORMAL LOW (ref 4.22–5.81)
RDW: 16.5 % — ABNORMAL HIGH (ref 11.5–15.5)
WBC: 11.1 10*3/uL — ABNORMAL HIGH (ref 4.0–10.5)

## 2016-03-09 LAB — RENAL FUNCTION PANEL
Albumin: 2.1 g/dL — ABNORMAL LOW (ref 3.5–5.0)
Anion gap: 10 (ref 5–15)
BUN: 41 mg/dL — ABNORMAL HIGH (ref 6–20)
CO2: 26 mmol/L (ref 22–32)
Calcium: 8.1 mg/dL — ABNORMAL LOW (ref 8.9–10.3)
Chloride: 97 mmol/L — ABNORMAL LOW (ref 101–111)
Creatinine, Ser: 7.67 mg/dL — ABNORMAL HIGH (ref 0.61–1.24)
GFR calc Af Amer: 8 mL/min — ABNORMAL LOW (ref >60)
GFR calc non Af Amer: 7 mL/min — ABNORMAL LOW (ref >60)
Glucose, Bld: 69 mg/dL (ref 65–99)
Phosphorus: 3.5 mg/dL (ref 2.5–4.6)
Potassium: 4.3 mmol/L (ref 3.5–5.1)
Sodium: 133 mmol/L — ABNORMAL LOW (ref 135–145)

## 2016-03-09 LAB — GLUCOSE, CAPILLARY
Glucose-Capillary: 78 mg/dL (ref 65–99)
Glucose-Capillary: 72 mg/dL (ref 65–99)
Glucose-Capillary: 105 mg/dL — ABNORMAL HIGH (ref 65–99)
Glucose-Capillary: 94 mg/dL (ref 65–99)

## 2016-03-09 LAB — PROTIME-INR
INR: 3.5
Prothrombin Time: 36 seconds — ABNORMAL HIGH (ref 11.4–15.2)

## 2016-03-09 NOTE — Progress Notes (Signed)
PROGRESS NOTE    [name]  XTK:[id number] DOB: 08-26-1950 DOA: 03/05/2016 PCP: John Lyons, MD    Brief Narrative:  64-yom with a hx of end-stage renal disease on hemodialysis presented to hospital for dialysis was noted to be febrile and hypotensive. He was noted to be septic in the setting of infection of his right foot. He was started on broad-spectrum antibiotics. MRI does not show any evidence of osteomyelitis or abscess. He was admitted for further evaluation of sepsis.   Assessment & Plan:   Active Problems:   Diabetes mellitus ([name])   History of DVT of lower extremity   Hypertension   End stage renal disease ([name])   Severe sepsis ([name])   Cellulitis of right foot   Sepsis ([name])  1. Sepsis. Source is likely his right foot. Without cough or shortness of breath, pneumonia would be less likely. Blood cultures have been sent and have not shown any growth yet. He is on vancomycin and Zosyn. He received fluids per sepsis protocol. Lactic acid was noted to be normal range. Hypotension has improved with IV fluids. 2. Cellulitis right foot/leg. Initial plain films do not indicate any underlying osteomyelitis. MRI does not show any deep infections including abscess or osteomyelitis. Wound care has seen the patient. Wounds on toes are still draining and foul smelling. Continue intravenous antibiotics for now. General surgery consulted and has recommended checking segmental dopplers. These could not accurately measure ABIs due to calcifications and lack of compressibility of arteries. Will obtain MRA of leg to evaluate vasculature. 3. End-stage renal disease on hemodialysis. Plan for dialysis tonight. Nephrology following.  4. History of hypertension. Patient is on amlodipine/benazepril. Blood pressures are currently on the low side. Will continue to hold these medications for now. 5. History of DVT on Coumadin. INR therapeutic. Coumadin per pharmacy 6.  Diabetes. Had episodes of  hypoglycemia. lantus currently on hold. Hold meal coverage novolog and continue sliding scale for now. Blood sugars stable   DVT prophylaxis: Coumadin  Code Status: Full  Family Communication: no family present Disposition Plan: Discharge home with [facility] once improved.    Consultants:   Nephrology   Wound care  General surgery   Procedures:   None   Antimicrobials:   Vancomycin 10/5 >>  Zosyn 10/5 >>    Subjective: Feels that warmth in RLE is improving. Foot continues to drain  Objective: Vitals:   03/08/16 1429 03/08/16 2018 03/09/16 0611 03/09/16 1513  BP: (!) 97/48 (!) 118/44 (!) 102/50 107/60  Pulse: 100 (!) 103 95 (!) 105  Resp: 16 18 18 18   Temp: 98.4 F (36.9 C) 98.3 F (36.8 C) 97.8 F (36.6 C) 97.7 F (36.5 C)  TempSrc: Oral Oral Oral Oral  SpO2: 99% 98% 98% 96%  Weight:   118.6 kg (261 lb 8 oz)   Height:        Intake/Output Summary (Last 24 hours) at 03/09/16 1846 Last data filed at 03/08/16 2100  Gross per 24 hour  Intake              300 ml  Output                1 ml  Net              299 ml   Filed Weights   03/05/16 1132 03/08/16 0533 03/09/16 0611  Weight: 118.9 kg (262 lb 2 oz) 118.9 kg (262 lb 1.6 oz) 118.6 kg (261 lb 8 oz)  Examination:  General exam: Appears calm and comfortable  Respiratory system: CTA B. Respiratory effort normal. Cardiovascular system: S1 & S2 heard, RRR. No JVD, murmurs, rubs, gallops or clicks.  Gastrointestinal system: Abdomen is nondistended, soft and nontender. No organomegaly or masses felt. Normal bowel sounds heard. Central nervous system: Alert and oriented. No focal neurological deficits. Extremities: right lower extremity is  edematous, right foot continues to have foul smelling drainage, warmth in lower extremity resolved Skin: wounds on right foot toes, draining foul smelling fluid Psychiatry: Judgement and insight appear normal. Mood & affect appropriate.     Data Reviewed: I have personally  reviewed following labs and imaging studies  CBC:  Recent Labs Lab 03/05/16 0727  03/06/16 0529 03/07/16 1000 03/08/16 0156 03/08/16 0726 03/09/16 0658  WBC 14.6*  --  13.5* 12.7* 11.4* 10.8* 11.1*  NEUTROABS 13.0*  --   --   --   --   --   --   HGB 10.6*  < > 10.3* 10.3* 10.3* 10.9* 10.5*  HCT 31.7*  < > 29.9* 30.2* 30.6* 32.7* 30.7*  MCV 84.3  --  83.3 81.6 82.3 82.8 81.4  PLT 217  --  201 226 232 255 245  < > = values in this interval not displayed. Basic Metabolic Panel:  Recent Labs Lab 03/06/16 0529 03/07/16 1000 03/08/16 0156 03/08/16 0726 03/09/16 0658  NA 134* 134* 132* 134* 133*  K 4.0 3.9 3.9 4.1 4.3  CL 100* 98* 96* 97* 97*  CO2 24 28 27 27 26   GLUCOSE 75 93 78 78 69  BUN 36* 25* 29* 32* 41*  CREATININE 7.25* 5.84* 6.45* 6.69* 7.67*  CALCIUM 8.2* 8.0* 8.0* 8.3* 8.1*  PHOS  --   --  3.3 3.3 3.5   GFR: Estimated Creatinine Clearance: 14 mL/min (by C-G formula based on SCr of 7.67 mg/dL (H)). Liver Function Tests:  Recent Labs Lab 03/06/16 0529 03/08/16 0156 03/08/16 0726 03/09/16 0658  AST 25  --   --   --   ALT 14*  --   --   --   ALKPHOS 136*  --   --   --   BILITOT 4.0*  --   --   --   PROT 6.3*  --   --   --   ALBUMIN 2.2* 2.1* 2.2* 2.1*   No results for input(s): LIPASE, AMYLASE in the last 168 hours. No results for input(s): AMMONIA in the last 168 hours. Coagulation Profile:  Recent Labs Lab 03/05/16 1134 03/06/16 0529 03/07/16 1000 03/08/16 0726 03/09/16 0658  INR 1.94 2.20 2.09 2.60 3.50   Cardiac Enzymes: No results for input(s): CKTOTAL, CKMB, CKMBINDEX, TROPONINI in the last 168 hours. BNP (last 3 results) No results for input(s): PROBNP in the last 8760 hours. HbA1C: No results for input(s): HGBA1C in the last 72 hours. CBG:  Recent Labs Lab 03/08/16 1537 03/08/16 2023 03/09/16 0802 03/09/16 1151 03/09/16 1643  GLUCAP 86 94 78 72 105*   Lipid Profile: No results for input(s): CHOL, HDL, LDLCALC, TRIG,  CHOLHDL, LDLDIRECT in the last 72 hours. Thyroid Function Tests: No results for input(s): TSH, T4TOTAL, FREET4, T3FREE, THYROIDAB in the last 72 hours. Anemia Panel: No results for input(s): VITAMINB12, FOLATE, FERRITIN, TIBC, IRON, RETICCTPCT in the last 72 hours. Sepsis Labs:  Recent Labs Lab 03/05/16 0740 03/05/16 1134 03/05/16 1355  PROCALCITON  --  8.00  --   LATICACIDVEN 1.80 1.2 0.9    Recent Results (from the past  240 hour(s))  Blood culture (routine x 2)     Status: None (Preliminary result)   Collection Time: 03/05/16  7:28 AM  Result Value Ref Range Status   Specimen Description BLOOD LEFT ARM  Final   Special Requests BOTTLES DRAWN AEROBIC ONLY 5CC  Final   Culture NO GROWTH 4 DAYS  Final   Report Status PENDING  Incomplete  Blood culture (routine x 2)     Status: None (Preliminary result)   Collection Time: 03/05/16  7:28 AM  Result Value Ref Range Status   Specimen Description BLOOD LEFT HAND  Final   Special Requests   Final    BOTTLES DRAWN AEROBIC AND ANAEROBIC AEB=5CC ANA=3CC   Culture NO GROWTH 4 DAYS  Final   Report Status PENDING  Incomplete  MRSA PCR Screening     Status: None   Collection Time: 03/05/16 10:47 AM  Result Value Ref Range Status   MRSA by PCR NEGATIVE NEGATIVE Final    Comment:        The GeneXpert MRSA Assay (FDA approved for NASAL specimens only), is one component of a comprehensive MRSA colonization surveillance program. It is not intended to diagnose MRSA infection nor to guide or monitor treatment for MRSA infections.          Radiology Studies: US Arterial Seg Single  Result Date: 03/09/2016 CLINICAL DATA:  Right for cellulitis. Draining toe wound. End-stage renal disease on hemodialysis. Hypertension, diabetes. EXAM: NONINVASIVE PHYSIOLOGIC VASCULAR STUDY OF BILATERAL LOWER EXTREMITIES TECHNIQUE: Evaluation of both lower extremities were performed at rest, including calculation of ankle-brachial indices with single  level Doppler, pressure recording. COMPARISON:  None. FINDINGS: Right ABI:  Non calculable due to vascular noncompressibility Left ABI:  0.78 Right Lower Extremity:  Biphasic waveforms distally. Left Lower Extremity:  Biphasic waveforms distally. IMPRESSION: 1. Bilateral lower extremity arterial occlusive disease, at least moderate in severity on the left, indeterminate severity on the right due to vascular noncompressibility. Should the patient fail conservative treatment, consider CTA runoff (higher spatial resolution) or MRA runoff (no radiation risk, can be performed noncontrast in the setting of renal dysfunction) to better define the site and nature of arterial occlusive disease and delineate treatment options. Electronically Signed   By: Lucrezia Europe [name]   On: 03/09/2016 13:39        Scheduled Meds: . collagenase   Topical Daily  . insulin aspart  0-5 Units Subcutaneous QHS  . insulin aspart  0-9 Units Subcutaneous TID WC  . iron polysaccharides  150 mg Oral Daily  . multivitamin  1 tablet Oral QHS  . piperacillin-tazobactam (ZOSYN)  IV  3.375 g Intravenous Q12H  . sevelamer carbonate  1,600 mg Oral TID WC  . sodium chloride  1,000 mL Intravenous Once   And  . sodium chloride  1,000 mL Intravenous Once   And  . sodium chloride  1,000 mL Intravenous Once  . sodium chloride flush  3 mL Intravenous Q12H  . vancomycin  1,000 mg Intravenous Q M,W,F-HD  . Warfarin - Pharmacist Dosing Inpatient   Does not apply Q24H   Continuous Infusions:    LOS: 4 days   Time spent: 25 minutes     Kathie Dike, MD [facility] If 7PM-7AM, please contact night-coverage [email or web address] Password [facility] 03/09/2016, 6:46 PM

## 2016-03-09 NOTE — Progress Notes (Signed)
Subjective: Interval History: Patient is asymptomatic. He denies any difficulty breathing.  Objective: Vital signs in last 24 hours: Temp:  [97.8 F (36.6 C)-98.4 F (36.9 C)] 97.8 F (36.6 C) (10/09 0611) Pulse Rate:  [95-103] 95 (10/09 0611) Resp:  [16-18] 18 (10/09 0611) BP: (97-118)/(44-50) 102/50 (10/09 0611) SpO2:  [98 %-99 %] 98 % (10/09 0611) Weight:  [118.6 kg (261 lb 8 oz)] 118.6 kg (261 lb 8 oz) (10/09 2637) Weight change: -0.272 kg (-9.6 oz)  Intake/Output from previous day: 10/08 0701 - 10/09 0700 In: 300 [IV Piggyback:300] Out: 1 [Stool:1] Intake/Output this shift: No intake/output data recorded.  General appearance: alert, cooperative and no distress Resp: clear to auscultation bilaterally Cardio: regular rate and rhythm GI: soft, non-tender; bowel sounds normal; no masses,  no organomegaly Extremities: venous stasis dermatitis noted and He has edema and possible gangrene and cellulitis of his toes.  Lab Results:  Recent Labs  03/08/16 0726 03/09/16 0658  WBC 10.8* 11.1*  HGB 10.9* 10.5*  HCT 32.7* 30.7*  PLT 255 245   BMET:   Recent Labs  03/08/16 0726 03/09/16 0658  NA 134* 133*  K 4.1 4.3  CL 97* 97*  CO2 27 26  GLUCOSE 78 69  BUN 32* 41*  CREATININE 6.69* 7.67*  CALCIUM 8.3* 8.1*   No results for input(s): PTH in the last 72 hours. Iron Studies: No results for input(s): IRON, TIBC, TRANSFERRIN, FERRITIN in the last 72 hours.  Studies/Results: No results found.  I have reviewed the patient's current medications.  Assessment/Plan: Problem #1 sepsis: Patient with cellulitis of his foot/gangrene. His a febrile but his white blood cell count is high. Patient is on antibiotics. Problem #2 end-stage renal disease: He is status post hemodialysis on Friday. Patient is due for dialysis today. Problem #3 hypotension: Most likely from infection. His blood pressure at this moment has improved Problem #4 history of diabetes: His blood sugar is  reasonably controlled  Problem #5 metabolic bone disease: His calcium And phosphorus is range. Problem #6 anemia: His hemoglobin is within our target goal. Patient is on Epogen. Plan: 1] we'll make arrangements for patient to get dialysis today. 2] 85/8 L his systolic blood pressures above 90. Marland Kitchen    LOS: 4 days   Silverio Hagan S 03/09/2016,10:27 AM

## 2016-03-09 NOTE — Care Management Important Message (Signed)
Important Message  Patient Details  Name: [name] MRN: [id number] Date of Birth: February 22, 1951   Medicare Important Message Given:  Yes    Jnae Thomaston, Chauncey Reading, RN 03/09/2016, 9:08 AM

## 2016-03-09 NOTE — Progress Notes (Signed)
[place] for COUMADIN (chronic Rx PTA) Indication: VTE prophylaxis, h/o DVT, chronic Coumadin from home  Allergies  Allergen Reactions  . No Known Allergies    Patient Measurements: Height: 6' 3.5" (191.8 cm) Weight: 261 lb 8 oz (118.6 kg) IBW/kg (Calculated) : 85.65  Vital Signs: Temp: 97.8 F (36.6 C) (10/09 0611) Temp Source: Oral (10/09 0611) BP: 102/50 (10/09 0611) Pulse Rate: 95 (10/09 0611)  Labs:  Recent Labs  03/07/16 1000 03/08/16 0156 03/08/16 0726 03/09/16 0658  HGB 10.3* 10.3* 10.9* 10.5*  HCT 30.2* 30.6* 32.7* 30.7*  PLT 226 232 255 245  LABPROT 23.8*  --  28.3* 36.0*  INR 2.09  --  2.60 3.50  CREATININE 5.84* 6.45* 6.69* 7.67*   Estimated Creatinine Clearance: 14 mL/min (by C-G formula based on SCr of 7.67 mg/dL (H)).  Medical History: Past Medical History:  Diagnosis Date  . Anemia   . Arthritis    HNP- lumbar, "all over my body"  . Blood transfusion    "years ago; blood was low" (08/05/2013)  . CKD (chronic kidney disease) stage 4, GFR 15-29 ml/min (HCC) 03/18/2012   [place]- T,TH,Sat.  . Diabetic nephropathy ([place])   . Diabetic retinopathy   . DVT (deep venous thrombosis) ([place])    "got one in my right leg now; I've had one before too, not sure which leg" (08/05/2013)  . ESRD (end stage renal disease) on dialysis [facility])    "just started today, (08/04/2013)"  . Family history of anesthesia complication    " my son wakes up slowly"  . GERD (gastroesophageal reflux disease)    uses alka seltzere on occas.   [name])    "one q now and then" (08/05/2013)  . Hyperlipidemia   . Hypertension   . 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 Non Hodgkin's lymphoma ([place])    Tx 2009; "had chemo; it went away" (08/05/2013)  . Noncompliance 03/16/2012  . NSVT (nonsustained ventricular tachycardia) ([place]) 03/18/2012  .  Peripheral vascular disease ([place])   . Pneumonia 2013   hosp.-   . Poor historian    pt. unsure of several answers to health history questions   . Skin cancer    melanoma - head  . Sleep apnea    "suppose to have a sleep study, but they never told me when. (08/05/2013)   Medications:  Prescriptions Prior to Admission  Medication Sig Dispense Refill Last Dose  . acetaminophen (TYLENOL) 325 MG tablet Take 650 mg by mouth every 6 (six) hours as needed (pain).   Past Week at Unknown time  . amlodipine-benazepril (LOTREL) 2.5-10 MG capsule Take 1 capsule by mouth daily.   03/04/2016 at Unknown time  . diclofenac sodium (VOLTAREN) 1 % GEL Apply topically 4 (four) times daily.   Past Week at Unknown time  . ergocalciferol (VITAMIN D2) 50000 units capsule Take 50,000 Units by mouth once a week.   Past Week at Unknown time  . insulin glargine (LANTUS) 100 UNIT/ML injection Inject 0.3 mLs (30 Units total) into the skin at bedtime. 10 mL 11 Past Week at Unknown time  . insulin lispro (HUMALOG) 100 UNIT/ML injection Inject 10 Units into the skin 3 (three) times daily with meals.    03/04/2016 at Unknown time  . lidocaine-prilocaine (EMLA) cream Apply 1 application topically as needed (Apply small amount to access site  1-2 hours before dialysis. Cover with occlusive dressing (saran wrap)).    Past Month at Unknown time  . multivitamin (RENA-VIT) TABS tablet Take 1 tablet by mouth at bedtime. 30 tablet 0 Past Week at Unknown time  . oxyCODONE-acetaminophen (PERCOCET/ROXICET) 5-325 MG tablet Take 1 tablet by mouth every 6 (six) hours as needed for moderate pain. 30 tablet 0 03/04/2016 at Unknown time  . polysaccharide iron (NIFEREX) 150 MG CAPS capsule Take 1 capsule (150 mg total) by mouth daily. 30 each 2 Past Week at Unknown time  . sevelamer carbonate (RENVELA) 800 MG tablet Take 1,600 mg by mouth 3 (three) times daily with meals.   Past Week at Unknown time  . warfarin (COUMADIN) 5 MG tablet Take 1.5 tablets  (7.5 mg total) by mouth at bedtime. (Patient taking differently: Take 7.5-10 mg by mouth at bedtime. Take 1.5 tablets on Tuesday,Wed, and Thurs, Saturday, and Sunday.  Take 2 tablets on Monday and Friday) 30 tablet 1 03/04/2016 at Unknown time   Assessment: 65yo male on chronic Coumadin due to h/o DVT.  Pt's home dose listed above but reportedly dose has changed and pt not always compliant with ordered dose.  INR is SUPRAtherapeutic today.  INR rising rapidly.  CBC appears stable.  No bleeding reported.   Goal of Therapy:  INR 2-3 Monitor platelets by anticoagulation protocol: Yes   Plan:  HOLD coumadin today, allow INR to trend down INR daily Monitor for s/sx of bleeding complications.    [place] Crane, Phi Avans A 03/09/2016,11:31 AM

## 2016-03-09 NOTE — Progress Notes (Signed)
Pharmacy Antibiotic Note  [name] is a 65 y.o. male admitted on 03/05/2016 with osteomyelitis / sepsis.  Pharmacy has been consulted for Vancomycin and Zosyn dosing.  Dialysis schedule is currently MWF.  Plan:  Continue Vancomycin 1000mg  IV after each HD Pre-Hemodialysis Vancomycin level goal range =15-25 mcg/ml Zosyn 3.375gm IV q12h, EID Monitor labs, renal fxn, progress and c/s Deescalate ABX when improved / appropriate.    Height: 6' 3.5" (191.8 cm) Weight: 261 lb 8 oz (118.6 kg) IBW/kg (Calculated) : 85.65  Temp (24hrs), Avg:98.2 F (36.8 C), Min:97.8 F (36.6 C), Max:98.4 F (36.9 C)   Recent Labs Lab 03/05/16 0740 03/05/16 1134 03/05/16 1355 03/06/16 0529 03/07/16 1000 03/08/16 0156 03/08/16 0726 03/09/16 0658  WBC  --   --   --  13.5* 12.7* 11.4* 10.8* 11.1*  CREATININE  --   --   --  7.25* 5.84* 6.45* 6.69* 7.67*  LATICACIDVEN 1.80 1.2 0.9  --   --   --   --   --     Estimated Creatinine Clearance: 14 mL/min (by C-G formula based on SCr of 7.67 mg/dL (H)).    Allergies  Allergen Reactions  . No Known Allergies    Antimicrobials this admission: Vancomycin 10/5 >>  Zosyn 10/5 >>   Dose adjustments this admission:   Recent Results (from the past 240 hour(s))  Blood culture (routine x 2)     Status: None (Preliminary result)   Collection Time: 03/05/16  7:28 AM  Result Value Ref Range Status   Specimen Description BLOOD LEFT ARM  Final   Special Requests BOTTLES DRAWN AEROBIC ONLY 5CC  Final   Culture NO GROWTH 4 DAYS  Final   Report Status PENDING  Incomplete  Blood culture (routine x 2)     Status: None (Preliminary result)   Collection Time: 03/05/16  7:28 AM  Result Value Ref Range Status   Specimen Description BLOOD LEFT HAND  Final   Special Requests   Final    BOTTLES DRAWN AEROBIC AND ANAEROBIC AEB=5CC ANA=3CC   Culture NO GROWTH 4 DAYS  Final   Report Status PENDING  Incomplete  MRSA PCR Screening     Status: None   Collection Time:  03/05/16 10:47 AM  Result Value Ref Range Status   MRSA by PCR NEGATIVE NEGATIVE Final    Comment:        The GeneXpert MRSA Assay (FDA approved for NASAL specimens only), is one component of a comprehensive MRSA colonization surveillance program. It is not intended to diagnose MRSA infection nor to guide or monitor treatment for MRSA infections.    Thank you for allowing pharmacy to be a part of this patient's care.  Hart Robinsons A 03/09/2016 11:36 AM

## 2016-03-10 ENCOUNTER — Inpatient Hospital Stay (HOSPITAL_COMMUNITY): Payer: Medicare Other

## 2016-03-10 DIAGNOSIS — R601 Generalized edema: Secondary | ICD-10-CM | POA: Insufficient documentation

## 2016-03-10 DIAGNOSIS — R590 Localized enlarged lymph nodes: Secondary | ICD-10-CM | POA: Insufficient documentation

## 2016-03-10 LAB — CULTURE, BLOOD (ROUTINE X 2)
Culture: NO GROWTH
Culture: NO GROWTH

## 2016-03-10 LAB — BASIC METABOLIC PANEL
Anion gap: 9 (ref 5–15)
BUN: 27 mg/dL — ABNORMAL HIGH (ref 6–20)
CO2: 28 mmol/L (ref 22–32)
Calcium: 7.8 mg/dL — ABNORMAL LOW (ref 8.9–10.3)
Chloride: 97 mmol/L — ABNORMAL LOW (ref 101–111)
Creatinine, Ser: 5.81 mg/dL — ABNORMAL HIGH (ref 0.61–1.24)
GFR calc Af Amer: 11 mL/min — ABNORMAL LOW (ref >60)
GFR calc non Af Amer: 9 mL/min — ABNORMAL LOW (ref >60)
Glucose, Bld: 88 mg/dL (ref 65–99)
Potassium: 3.9 mmol/L (ref 3.5–5.1)
Sodium: 134 mmol/L — ABNORMAL LOW (ref 135–145)

## 2016-03-10 LAB — CBC
HCT: 31.1 % — ABNORMAL LOW (ref 39.0–52.0)
Hemoglobin: 10.5 g/dL — ABNORMAL LOW (ref 13.0–17.0)
MCH: 27.8 pg (ref 26.0–34.0)
MCHC: 33.8 g/dL (ref 30.0–36.0)
MCV: 82.3 fL (ref 78.0–100.0)
Platelets: 251 10*3/uL (ref 150–400)
RBC: 3.78 MIL/uL — ABNORMAL LOW (ref 4.22–5.81)
RDW: 16.7 % — ABNORMAL HIGH (ref 11.5–15.5)
WBC: 12.6 10*3/uL — ABNORMAL HIGH (ref 4.0–10.5)

## 2016-03-10 LAB — PROTIME-INR
INR: 3.9
Prothrombin Time: 39.2 seconds — ABNORMAL HIGH (ref 11.4–15.2)

## 2016-03-10 LAB — GLUCOSE, CAPILLARY
Glucose-Capillary: 77 mg/dL (ref 65–99)
Glucose-Capillary: 91 mg/dL (ref 65–99)
Glucose-Capillary: 112 mg/dL — ABNORMAL HIGH (ref 65–99)
Glucose-Capillary: 123 mg/dL — ABNORMAL HIGH (ref 65–99)

## 2016-03-10 MED ORDER — SEVELAMER CARBONATE 800 MG PO TABS
800.0000 mg | ORAL_TABLET | Freq: Three times a day (TID) | ORAL | Status: DC
  Administered 2016-03-10 – 2016-03-17 (×16): 800 mg via ORAL
  Filled 2016-03-10 (×16): qty 1

## 2016-03-10 MED ORDER — IOPAMIDOL (ISOVUE-370) INJECTION 76%
150.0000 mL | Freq: Once | INTRAVENOUS | Status: AC | PRN
  Administered 2016-03-10: 150 mL via INTRAVENOUS

## 2016-03-10 NOTE — Progress Notes (Signed)
[place] for COUMADIN (chronic Rx PTA) Indication: VTE prophylaxis, h/o DVT, chronic Coumadin from home  Allergies  Allergen Reactions  . No Known Allergies    Patient Measurements: Height: 6' 3.5" (191.8 cm) Weight: 257 lb 15 oz (117 kg) IBW/kg (Calculated) : 85.65  Vital Signs: Temp: 98.2 F (36.8 C) (10/10 0635) Temp Source: Oral (10/10 0635) BP: 104/28 (10/10 0635) Pulse Rate: 98 (10/10 0635)  Labs:  Recent Labs  03/08/16 0156 03/08/16 0726 03/09/16 0658 03/10/16 0552  HGB 10.3* 10.9* 10.5*  --   HCT 30.6* 32.7* 30.7*  --   PLT 232 255 245  --   LABPROT  --  28.3* 36.0* 39.2*  INR  --  2.60 3.50 3.90  CREATININE 6.45* 6.69* 7.67* 5.81*   Estimated Creatinine Clearance: 18.3 mL/min (by C-G formula based on SCr of 5.81 mg/dL (H)).  Medical History: Past Medical History:  Diagnosis Date  . Anemia   . Arthritis    HNP- lumbar, "all over my body"  . Blood transfusion    "years ago; blood was low" (08/05/2013)  . CKD (chronic kidney disease) stage 4, GFR 15-29 ml/min (HCC) 03/18/2012   [place]- T,TH,Sat.  . Diabetic nephropathy ([place])   . Diabetic retinopathy   . DVT (deep venous thrombosis) ([place])    "got one in my right leg now; I've had one before too, not sure which leg" (08/05/2013)  . ESRD (end stage renal disease) on dialysis [facility])    "just started today, (08/04/2013)"  . Family history of anesthesia complication    " my son wakes up slowly"  . GERD (gastroesophageal reflux disease)    uses alka seltzere on occas.   [name])    "one q now and then" (08/05/2013)  . Hyperlipidemia   . Hypertension   . 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 Non Hodgkin's lymphoma ([place])    Tx 2009; "had chemo; it went away" (08/05/2013)  . Noncompliance 03/16/2012  . NSVT (nonsustained ventricular tachycardia) ([place]) 03/18/2012  .  Peripheral vascular disease ([place])   . Pneumonia 2013   hosp.-   . Poor historian    pt. unsure of several answers to health history questions   . Skin cancer    melanoma - head  . Sleep apnea    "suppose to have a sleep study, but they never told me when. (08/05/2013)   Medications:  Prescriptions Prior to Admission  Medication Sig Dispense Refill Last Dose  . acetaminophen (TYLENOL) 325 MG tablet Take 650 mg by mouth every 6 (six) hours as needed (pain).   Past Week at Unknown time  . amlodipine-benazepril (LOTREL) 2.5-10 MG capsule Take 1 capsule by mouth daily.   03/04/2016 at Unknown time  . diclofenac sodium (VOLTAREN) 1 % GEL Apply topically 4 (four) times daily.   Past Week at Unknown time  . ergocalciferol (VITAMIN D2) 50000 units capsule Take 50,000 Units by mouth once a week.   Past Week at Unknown time  . insulin glargine (LANTUS) 100 UNIT/ML injection Inject 0.3 mLs (30 Units total) into the skin at bedtime. 10 mL 11 Past Week at Unknown time  . insulin lispro (HUMALOG) 100 UNIT/ML injection Inject 10 Units into the skin 3 (three) times daily with meals.    03/04/2016 at Unknown time  . lidocaine-prilocaine (EMLA) cream Apply 1 application topically as needed (  Apply small amount to access site 1-2 hours before dialysis. Cover with occlusive dressing (saran wrap)).    Past Month at Unknown time  . multivitamin (RENA-VIT) TABS tablet Take 1 tablet by mouth at bedtime. 30 tablet 0 Past Week at Unknown time  . oxyCODONE-acetaminophen (PERCOCET/ROXICET) 5-325 MG tablet Take 1 tablet by mouth every 6 (six) hours as needed for moderate pain. 30 tablet 0 03/04/2016 at Unknown time  . polysaccharide iron (NIFEREX) 150 MG CAPS capsule Take 1 capsule (150 mg total) by mouth daily. 30 each 2 Past Week at Unknown time  . sevelamer carbonate (RENVELA) 800 MG tablet Take 1,600 mg by mouth 3 (three) times daily with meals.   Past Week at Unknown time  . warfarin (COUMADIN) 5 MG tablet Take 1.5 tablets  (7.5 mg total) by mouth at bedtime. (Patient taking differently: Take 7.5-10 mg by mouth at bedtime. Take 1.5 tablets on Tuesday,Wed, and Thurs, Saturday, and Sunday.  Take 2 tablets on Monday and Friday) 30 tablet 1 03/04/2016 at Unknown time   Assessment: 65yo male on chronic Coumadin due to h/o DVT.  Pt's home dose listed above but reportedly dose has changed and pt not always compliant with ordered dose.  INR is SUPRAtherapeutic today.  CBC appears stable.  No bleeding reported.   Goal of Therapy:  INR 2-3 Monitor platelets by anticoagulation protocol: Yes   Plan:  HOLD coumadin today, allow INR to trend down INR daily Monitor for s/sx of bleeding complications.    Hart Robinsons A 03/10/2016,10:53 AM

## 2016-03-10 NOTE — Progress Notes (Signed)
Subjective: Interval History: The patient offers no complaints. His appetite is good and no difficulty breathing.  Objective: Vital signs in last 24 hours: Temp:  [97.7 F (36.5 C)-98.7 F (37.1 C)] 98.2 F (36.8 C) (10/10 0635) Pulse Rate:  [98-107] 98 (10/10 0635) Resp:  [16-18] 16 (10/10 0216) BP: (95-143)/(28-62) 104/28 (10/10 0635) SpO2:  [94 %-99 %] 94 % (10/10 0635) Weight:  [117 kg (257 lb 15 oz)-119.3 kg (263 lb 0.1 oz)] 117 kg (257 lb 15 oz) (10/10 0216) Weight change: 0.684 kg (1 lb 8.1 oz)  Intake/Output from previous day: 10/09 0701 - 10/10 0700 In: -  Out: 2500  Intake/Output this shift: No intake/output data recorded.  General appearance: alert, cooperative and no distress Resp: clear to auscultation bilaterally Cardio: regular rate and rhythm GI: soft, non-tender; bowel sounds normal; no masses,  no organomegaly Extremities: venous stasis dermatitis noted and He has edema and possible gangrene and cellulitis of his toes.  Lab Results:  Recent Labs  03/08/16 0726 03/09/16 0658  WBC 10.8* 11.1*  HGB 10.9* 10.5*  HCT 32.7* 30.7*  PLT 255 245   BMET:   Recent Labs  03/09/16 0658 03/10/16 0552  NA 133* 134*  K 4.3 3.9  CL 97* 97*  CO2 26 28  GLUCOSE 69 88  BUN 41* 27*  CREATININE 7.67* 5.81*  CALCIUM 8.1* 7.8*   No results for input(s): PTH in the last 72 hours. Iron Studies: No results for input(s): IRON, TIBC, TRANSFERRIN, FERRITIN in the last 72 hours.  Studies/Results: US Arterial Seg Single  Result Date: 03/09/2016 CLINICAL DATA:  Right for cellulitis. Draining toe wound. End-stage renal disease on hemodialysis. Hypertension, diabetes. EXAM: NONINVASIVE PHYSIOLOGIC VASCULAR STUDY OF BILATERAL LOWER EXTREMITIES TECHNIQUE: Evaluation of both lower extremities were performed at rest, including calculation of ankle-brachial indices with single level Doppler, pressure recording. COMPARISON:  None. FINDINGS: Right ABI:  Non calculable due to  vascular noncompressibility Left ABI:  0.78 Right Lower Extremity:  Biphasic waveforms distally. Left Lower Extremity:  Biphasic waveforms distally. IMPRESSION: 1. Bilateral lower extremity arterial occlusive disease, at least moderate in severity on the left, indeterminate severity on the right due to vascular noncompressibility. Should the patient fail conservative treatment, consider CTA runoff (higher spatial resolution) or MRA runoff (no radiation risk, can be performed noncontrast in the setting of renal dysfunction) to better define the site and nature of arterial occlusive disease and delineate treatment options. Electronically Signed   By: Lucrezia Europe [name]   On: 03/09/2016 13:39    I have reviewed the patient's current medications.  Assessment/Plan: Problem #1 sepsis: Patient with cellulitis of his foot/gangrene. His a febrile but his white blood cell count is high. Patient is on antibiotics. Patient for possible surgery of his left foot.. Problem #2 end-stage renal disease: He is status post hemodialysis yesterday. His potassium is normal and he doesn't have any uremic signs and symptoms. Problem #3 history of hypertension: His blood pressure is reasonably controlled. Patient came with hypotension related to infection which has improved. Problem #4 history of diabetes: His blood sugar is reasonably controlled  Problem #5 metabolic bone disease: His calcium And phosphorus is range. Presently he is on Renvela 800 mg 2 tablets by mouth 3 times a day with meals. Problem #6 anemia: His hemoglobin is within our target goal. Patient is on Epogen. Plan: 1] we'll make arrangements for patient to get dialysis tomorrow 2] 01/0 L his systolic blood pressures above 90. 3] we'll change Renvela to  800 mg 1 tablet by mouth 3 times a day with meals. .    LOS: 5 days   Chandell Attridge S 03/10/2016,9:27 AM

## 2016-03-10 NOTE — Consult Note (Signed)
   [facility] Inpatient Consult   03/10/2016  LENIS NETTLETON [birth year] [id number]  Spoke with patient at bedside regarding [facility] services. Patient does not want to participate with [facility] at this time, he did ask several questions regarding [facility] program services, RNCM discussed in detail [facility] program. Patient given [facility] brochure and contact information for future reference, voices appreciation of information.    Inpatient case manager aware that patient offered [facility] case management services but declined.   Of note, [facility] Care Management services would not replace or interfere with any services that are arranged by inpatient case management or social work. For additional questions or referrals please contact:   Royetta Crochet. Laymond Purser, RN, BSN, [facility] Liaison [phone]

## 2016-03-10 NOTE — Progress Notes (Signed)
Subjective: Patient feels that his right lower extremity is back to baseline. He states is always swollen compared to the left side.  Objective: Vital signs in last 24 hours: Temp:  [97.7 F (36.5 C)-98.7 F (37.1 C)] 98.2 F (36.8 C) (10/10 0635) Pulse Rate:  [98-107] 98 (10/10 0635) Resp:  [16-18] 16 (10/10 0216) BP: (95-143)/(28-62) 104/28 (10/10 0635) SpO2:  [94 %-99 %] 94 % (10/10 0635) Weight:  [117 kg (257 lb 15 oz)-119.3 kg (263 lb 0.1 oz)] 117 kg (257 lb 15 oz) (10/10 0216) Last BM Date: 03/10/16  Intake/Output from previous day: 10/09 0701 - 10/10 0700 In: -  Out: 2500  Intake/Output this shift: No intake/output data recorded.  General appearance: alert, cooperative and no distress Extremities: Right lower extremity less ruborous in the pretibial region. Calf muscle is soft. Less erythema noted.  Lab Results:   Recent Labs  03/08/16 0726 03/09/16 0658  WBC 10.8* 11.1*  HGB 10.9* 10.5*  HCT 32.7* 30.7*  PLT 255 245   BMET  Recent Labs  03/09/16 0658 03/10/16 0552  NA 133* 134*  K 4.3 3.9  CL 97* 97*  CO2 26 28  GLUCOSE 69 88  BUN 41* 27*  CREATININE 7.67* 5.81*  CALCIUM 8.1* 7.8*   PT/INR  Recent Labs  03/09/16 0658 03/10/16 0552  LABPROT 36.0* 39.2*  INR 3.50 3.90    Studies/Results: US Arterial Seg Single  Result Date: 03/09/2016 CLINICAL DATA:  Right for cellulitis. Draining toe wound. End-stage renal disease on hemodialysis. Hypertension, diabetes. EXAM: NONINVASIVE PHYSIOLOGIC VASCULAR STUDY OF BILATERAL LOWER EXTREMITIES TECHNIQUE: Evaluation of both lower extremities were performed at rest, including calculation of ankle-brachial indices with single level Doppler, pressure recording. COMPARISON:  None. FINDINGS: Right ABI:  Non calculable due to vascular noncompressibility Left ABI:  0.78 Right Lower Extremity:  Biphasic waveforms distally. Left Lower Extremity:  Biphasic waveforms distally. IMPRESSION: 1. Bilateral lower extremity  arterial occlusive disease, at least moderate in severity on the left, indeterminate severity on the right due to vascular noncompressibility. Should the patient fail conservative treatment, consider CTA runoff (higher spatial resolution) or MRA runoff (no radiation risk, can be performed noncontrast in the setting of renal dysfunction) to better define the site and nature of arterial occlusive disease and delineate treatment options. Electronically Signed   By: Lucrezia Europe [name]   On: 03/09/2016 13:39    Anti-infectives: Anti-infectives    Start     Dose/Rate Route Frequency Ordered Stop   03/07/16 1300  vancomycin (VANCOCIN) IVPB 1000 mg/200 mL premix     1,000 mg 200 mL/hr over 60 Minutes Intravenous  Once 03/07/16 1145 03/07/16 1417   03/06/16 1200  vancomycin (VANCOCIN) IVPB 1000 mg/200 mL premix     1,000 mg 200 mL/hr over 60 Minutes Intravenous Every M-W-F (Hemodialysis) 03/06/16 1052     03/05/16 2000  piperacillin-tazobactam (ZOSYN) IVPB 3.375 g     3.375 g 12.5 mL/hr over 240 Minutes Intravenous Every 12 hours 03/05/16 1015     03/05/16 1115  piperacillin-tazobactam (ZOSYN) IVPB 3.375 g  Status:  Discontinued     3.375 g 100 mL/hr over 30 Minutes Intravenous  Once 03/05/16 1109 03/05/16 1111   03/05/16 1115  vancomycin (VANCOCIN) IVPB 1000 mg/200 mL premix  Status:  Discontinued     1,000 mg 200 mL/hr over 60 Minutes Intravenous  Once 03/05/16 1109 03/05/16 1112   03/05/16 0745  piperacillin-tazobactam (ZOSYN) IVPB 3.375 g     3.375 g 100 mL/hr over  30 Minutes Intravenous  Once 03/05/16 0730 03/05/16 0811   03/05/16 0745  vancomycin (VANCOCIN) 2,000 mg in sodium chloride 0.9 % 500 mL IVPB     2,000 mg 250 mL/hr over 120 Minutes Intravenous  Once 03/05/16 0732 03/05/16 1053      Assessment/Plan: Impression: Cellulitis of right lower extremity, slowly resolving. Patient did have segmental Dopplers which showed noncompressibility of the vessels in the right lower extremity. An MRA  may be needed in the future.  Further vascular workup is warranted. This may be done as an outpatient. Will need to weeks total of antibiotics. He states he wants to follow-up with his podiatrist in [place].  LOS: 5 days    Armella Stogner A 03/10/2016

## 2016-03-10 NOTE — Progress Notes (Addendum)
PROGRESS NOTE    [name]  WHQ:[id number] DOB: [birth year] DOA: 03/05/2016 PCP: Geoffery Lyons, MD    Brief Narrative:  65-yom with a hx of end-stage renal disease on hemodialysis presented to hospital for dialysis was noted to be febrile and hypotensive. He was noted to be septic in the setting of infection of his right foot. He was started on broad-spectrum antibiotics. MRI does not show any evidence of osteomyelitis or abscess. He was admitted for further evaluation of sepsis.   Assessment & Plan:   Active Problems:   Diabetes mellitus ([place])   History of DVT of lower extremity   Hypertension   End stage renal disease ([place])   Severe sepsis ([place])   Cellulitis of right foot   Sepsis ([place])  1. Sepsis. Source is likely his right foot. Without cough or shortness of breath, pneumonia would be less likely. Blood cultures have been sent and have not shown any growth yet. He is on vancomycin and Zosyn. He received fluids per sepsis protocol. Lactic acid was noted to be normal range. Hypotension has improved with IV fluids. 2. Cellulitis right foot/leg. Initial plain films do not indicate any underlying osteomyelitis. MRI does not show any deep infections including abscess or osteomyelitis. Wound care has seen the patient. Wounds on toes are still draining and foul smelling and there is concern for developing gangrene. Continue intravenous antibiotics for now. General surgery consulted and has recommended checking segmental dopplers. These could not accurately measure ABIs due to calcifications and lack of compressibility of arteries. CTA of right leg was obtained and reviewed with Dr. Scot Dock on call for vascular surgery. It was felt that patient has adequate blood flow to right foot for healing and if amputation is desired, then it may be reasonable to consider trans-metatarsal amputation. If patient fails to heal post amputation, then he would need an arteriogram to better evaluate his  vasculature. Discussed with Dr. Arnoldo Morale who will re-evaluate the patient tomorrow. 3. End-stage renal disease on hemodialysis. Nephrology following for dialysis needs.  4. History of hypertension. Patient is on amlodipine/benazepril. Blood pressures are currently on the low side. Will continue to hold these medications for now. 5. History of DVT on Coumadin. INR therapeutic. Coumadin per pharmacy 6.  Diabetes. Had episodes of hypoglycemia. lantus currently on hold. Hold meal coverage novolog and continue sliding scale for now. Blood sugars stable 7. Anemia, likely related to renal disease. Hemoglobin is at baseline.   DVT prophylaxis: Coumadin  Code Status: Full  Family Communication: no family present Disposition Plan: Discharge home with [facility] once improved.    Consultants:   Nephrology   Wound care  General surgery   Procedures:   None   Antimicrobials:   Vancomycin 10/5 >>  Zosyn 10/5 >>    Subjective: Continues to note drainage from right foot. Feels that right leg is warm again  Objective: Vitals:   03/10/16 0145 03/10/16 0216 03/10/16 0254 03/10/16 0635  BP: (!) 113/51 (!) 114/51 (!) 113/33 (!) 104/28  Pulse: (!) 103 100 (!) 105 98  Resp: 16 16    Temp:   98 F (36.7 C) 98.2 F (36.8 C)  TempSrc:   Oral Oral  SpO2:    94%  Weight:  117 kg (257 lb 15 oz)    Height:        Intake/Output Summary (Last 24 hours) at 03/10/16 1826 Last data filed at 03/10/16 0216  Gross per 24 hour  Intake  0 ml  Output             2500 ml  Net            -2500 ml   Filed Weights   03/09/16 0611 03/09/16 2210 03/10/16 0216  Weight: 118.6 kg (261 lb 8 oz) 119.3 kg (263 lb 0.1 oz) 117 kg (257 lb 15 oz)    Examination:  General exam: Appears calm and comfortable  Respiratory system: CTA B. Respiratory effort normal. Cardiovascular system: S1 & S2 heard, RRR. No JVD, murmurs, rubs, gallops or clicks.  Gastrointestinal system: Abdomen is nondistended, soft  and nontender. No organomegaly or masses felt. Normal bowel sounds heard. Central nervous system: Alert and oriented. No focal neurological deficits. Extremities: right lower extremity is  edematous, right foot continues to have foul smelling drainage, warmth in lower extremity present Skin: wounds on right foot toes, draining foul smelling fluid. Still has significant wounds on first and 5th toes Psychiatry: Judgement and insight appear normal. Mood & affect appropriate.     Data Reviewed: I have personally reviewed following labs and imaging studies  CBC:  Recent Labs Lab 03/05/16 0727  03/07/16 1000 03/08/16 0156 03/08/16 0726 03/09/16 0658 03/10/16 0552  WBC 14.6*  < > 12.7* 11.4* 10.8* 11.1* 12.6*  NEUTROABS 13.0*  --   --   --   --   --   --   HGB 10.6*  < > 10.3* 10.3* 10.9* 10.5* 10.5*  HCT 31.7*  < > 30.2* 30.6* 32.7* 30.7* 31.1*  MCV 84.3  < > 81.6 82.3 82.8 81.4 82.3  PLT 217  < > 226 232 255 245 251  < > = values in this interval not displayed. Basic Metabolic Panel:  Recent Labs Lab 03/07/16 1000 03/08/16 0156 03/08/16 0726 03/09/16 0658 03/10/16 0552  NA 134* 132* 134* 133* 134*  K 3.9 3.9 4.1 4.3 3.9  CL 98* 96* 97* 97* 97*  CO2 28 27 27 26 28   GLUCOSE 93 78 78 69 88  BUN 25* 29* 32* 41* 27*  CREATININE 5.84* 6.45* 6.69* 7.67* 5.81*  CALCIUM 8.0* 8.0* 8.3* 8.1* 7.8*  PHOS  --  3.3 3.3 3.5  --    GFR: Estimated Creatinine Clearance: 18.3 mL/min (by C-G formula based on SCr of 5.81 mg/dL (H)). Liver Function Tests:  Recent Labs Lab 03/06/16 0529 03/08/16 0156 03/08/16 0726 03/09/16 0658  AST 25  --   --   --   ALT 14*  --   --   --   ALKPHOS 136*  --   --   --   BILITOT 4.0*  --   --   --   PROT 6.3*  --   --   --   ALBUMIN 2.2* 2.1* 2.2* 2.1*   No results for input(s): LIPASE, AMYLASE in the last 168 hours. No results for input(s): AMMONIA in the last 168 hours. Coagulation Profile:  Recent Labs Lab 03/06/16 0529 03/07/16 1000  03/08/16 0726 03/09/16 0658 03/10/16 0552  INR 2.20 2.09 2.60 3.50 3.90   Cardiac Enzymes: No results for input(s): CKTOTAL, CKMB, CKMBINDEX, TROPONINI in the last 168 hours. BNP (last 3 results) No results for input(s): PROBNP in the last 8760 hours. HbA1C: No results for input(s): HGBA1C in the last 72 hours. CBG:  Recent Labs Lab 03/09/16 1643 03/09/16 2158 03/10/16 0729 03/10/16 1121 03/10/16 1641  GLUCAP 105* 94 77 91 112*   Lipid Profile: No results for input(s): CHOL, HDL,  LDLCALC, TRIG, CHOLHDL, LDLDIRECT in the last 72 hours. Thyroid Function Tests: No results for input(s): TSH, T4TOTAL, FREET4, T3FREE, THYROIDAB in the last 72 hours. Anemia Panel: No results for input(s): VITAMINB12, FOLATE, FERRITIN, TIBC, IRON, RETICCTPCT in the last 72 hours. Sepsis Labs:  Recent Labs Lab 03/05/16 0740 03/05/16 1134 03/05/16 1355  PROCALCITON  --  8.00  --   LATICACIDVEN 1.80 1.2 0.9    Recent Results (from the past 240 hour(s))  Blood culture (routine x 2)     Status: None   Collection Time: 03/05/16  7:28 AM  Result Value Ref Range Status   Specimen Description BLOOD LEFT ARM  Final   Special Requests BOTTLES DRAWN AEROBIC ONLY 5CC  Final   Culture NO GROWTH 5 DAYS  Final   Report Status 03/10/2016 FINAL  Final  Blood culture (routine x 2)     Status: None   Collection Time: 03/05/16  7:28 AM  Result Value Ref Range Status   Specimen Description BLOOD LEFT HAND  Final   Special Requests   Final    BOTTLES DRAWN AEROBIC AND ANAEROBIC AEB=5CC ANA=3CC   Culture NO GROWTH 5 DAYS  Final   Report Status 03/10/2016 FINAL  Final  MRSA PCR Screening     Status: None   Collection Time: 03/05/16 10:47 AM  Result Value Ref Range Status   MRSA by PCR NEGATIVE NEGATIVE Final    Comment:        The GeneXpert MRSA Assay (FDA approved for NASAL specimens only), is one component of a comprehensive MRSA colonization surveillance program. It is not intended to diagnose  MRSA infection nor to guide or monitor treatment for MRSA infections.          Radiology Studies: Ct Angio Ao+bifem W &/or Wo Contrast  Result Date: 03/10/2016 CLINICAL DATA:  65 year old with right leg and foot swelling. History of melanoma, peripheral vascular disease and hemodialysis. History of non-Hodgkin's lymphoma. EXAM: CT ANGIOGRAPHY OF ABDOMINAL AORTA WITH ILIOFEMORAL RUNOFF TECHNIQUE: Multidetector CT imaging of the abdomen, pelvis and lower extremities was performed using the standard protocol during bolus administration of intravenous contrast. Multiplanar CT image reconstructions and MIPs were obtained to evaluate the vascular anatomy. CONTRAST:  150 mL Isovue 370 COMPARISON:  PET-CT dated 03/22/2015 FINDINGS: VASCULAR Aorta: Normal caliber of the abdominal aorta without dissection. Mild atherosclerotic disease in the abdominal aorta. Celiac: Patent without evidence of aneurysm, dissection, vasculitis or significant stenosis. SMA: Patent without evidence of aneurysm, dissection, vasculitis or significant stenosis. Renals: Bilateral renal arteries are patent. No significant renal artery stenosis. IMA: Patent with calcified plaque. RIGHT Lower Extremity Inflow: Right iliac arteries have calcified plaque without significant stenosis. Outflow: Right common femoral artery is widely patent with mild plaque. Right profunda femoral arteries are patent. Diffuse atherosclerotic disease in the right SFA without critical stenosis. Diffuse atherosclerotic disease in the right popliteal artery without critical stenosis. Runoff: Runoff vessels are diffusely calcified. There is 3 vessel runoff in the right lower extremity with a short-segment occlusion in the mid anterior tibial artery with distal reconstitution. There is flow at the posterior tibial artery and dorsalis pedis artery at the ankle. LEFT Lower Extremity Inflow: Atherosclerotic calcifications in the left iliac arteries without significant  stenosis. Outflow: Left common femoral artery is widely patent. Left profunda femoral arteries are patent. Diffuse atherosclerotic disease in the left SFA without critical stenosis. Mild narrowing in the mid left popliteal artery. Runoff: Runoff vessels are heavily calcified. There appears to be three-vessel  runoff in the left lower extremity. Appears to be flow at the dorsalis pedis artery and posterior tibial artery at the ankle. Veins: IVC filter positioned below the renal veins. Cannot evaluate for IVC patency on this arterial phase of imaging. Review of the MIP images confirms the above findings. NON-VASCULAR Lower chest: Small pleural effusions, right side greater than left. Mild edema or atelectasis at both lung bases. Central venous catheter extending into the right atrium. Hepatobiliary: Limited evaluation on this arterial phase imaging. There is large gallstone measuring up the 3.3 cm. Difficult to evaluate for any gallbladder wall thickening or inflammation due to large amount of perihepatic ascites. Pancreas: Limited evaluation but no gross abnormality. Spleen: Normal appearance of spleen without enlargement. Adrenals/Urinary Tract: No gross abnormality to the left adrenal gland. Right adrenal gland is poorly visualized. No evidence for hydronephrosis. Urinary bladder is decompressed. Stomach/Bowel: No gross abnormality to the stomach, small or large bowel. No evidence for bowel obstruction. Lymphatic: Limited evaluation for abdominal or pelvic lymphadenopathy. There appears to be small lymph nodes in the left periaortic region. There are markedly enlarged lymph nodes in the inguinal regions, right side greater than left. These large inguinal lymph nodes may be reactive. Index right inguinal lymph node measures 2.4 cm in short axis on sequence 5, 184, previously measured 1.9 cm in the short axis. Reproductive: No gross abnormality to the prostate. Other: Subcutaneous edema in both lower extremities,  right side greater than left. Soft tissue calcifications in the posterior thigh musculature bilaterally. Large amount of ascites throughout the abdomen and pelvis. The ascites has clearly progressed since 03/22/2015. Musculoskeletal: Cortical thickening in the proximal right fibula probably related to an old injury. There is a fracture involving the distal left fibula. Callus formation along the posterior aspect of this fracture suggesting a subacute injury or incomplete healing. Significant disc and endplate disease at O2-U2. IMPRESSION: VASCULAR Calcified atherosclerotic disease throughout the aorta and lower extremities bilaterally. There is no significant inflow or outflow disease. Evaluation of the runoff vessels is limited due to the heavily calcified vessels but there is flow to both ankles. There is a short-segment occlusion of the right anterior tibial artery. IVC filter is present. NON-VASCULAR Large volume ascites. Findings could be related to underlying liver disease but the liver is poorly characterized on this examination. Cholelithiasis. Small pleural effusions with dependent pulmonary edema or atelectasis. Diffuse subcutaneous edema throughout the lower extremities, right side greater than left. Enlarged inguinal lymph nodes are probably reactive in etiology. Recommend monitoring this lymphadenopathy based on the history of lymphoma. Age-indeterminate fracture in the distal left fibula. This fracture has lucency with a small amount of callus formation. This could represent a subacute injury versus a nonunion. Electronically Signed   By: Markus Daft [name]   On: 03/10/2016 12:53   US Arterial Seg Single  Result Date: 03/09/2016 CLINICAL DATA:  Right for cellulitis. Draining toe wound. End-stage renal disease on hemodialysis. Hypertension, diabetes. EXAM: NONINVASIVE PHYSIOLOGIC VASCULAR STUDY OF BILATERAL LOWER EXTREMITIES TECHNIQUE: Evaluation of both lower extremities were performed at rest,  including calculation of ankle-brachial indices with single level Doppler, pressure recording. COMPARISON:  None. FINDINGS: Right ABI:  Non calculable due to vascular noncompressibility Left ABI:  0.78 Right Lower Extremity:  Biphasic waveforms distally. Left Lower Extremity:  Biphasic waveforms distally. IMPRESSION: 1. Bilateral lower extremity arterial occlusive disease, at least moderate in severity on the left, indeterminate severity on the right due to vascular noncompressibility. Should the patient fail conservative treatment, consider  CTA runoff (higher spatial resolution) or MRA runoff (no radiation risk, can be performed noncontrast in the setting of renal dysfunction) to better define the site and nature of arterial occlusive disease and delineate treatment options. Electronically Signed   By: Lucrezia Europe [name]   On: 03/09/2016 13:39        Scheduled Meds: . collagenase   Topical Daily  . insulin aspart  0-5 Units Subcutaneous QHS  . insulin aspart  0-9 Units Subcutaneous TID WC  . iron polysaccharides  150 mg Oral Daily  . multivitamin  1 tablet Oral QHS  . piperacillin-tazobactam (ZOSYN)  IV  3.375 g Intravenous Q12H  . sevelamer carbonate  800 mg Oral TID WC  . sodium chloride  1,000 mL Intravenous Once   And  . sodium chloride  1,000 mL Intravenous Once   And  . sodium chloride  1,000 mL Intravenous Once  . sodium chloride flush  3 mL Intravenous Q12H  . vancomycin  1,000 mg Intravenous Q M,W,F-HD  . Warfarin - Pharmacist Dosing Inpatient   Does not apply Q24H   Continuous Infusions:    LOS: 5 days   Time spent: 30 minutes     Kathie Dike, MD [facility] If 7PM-7AM, please contact night-coverage [email or web address] Password [email or web address] 03/10/2016, 6:26 PM

## 2016-03-11 ENCOUNTER — Inpatient Hospital Stay (HOSPITAL_COMMUNITY): Payer: Medicare Other

## 2016-03-11 DIAGNOSIS — D631 Anemia in chronic kidney disease: Secondary | ICD-10-CM

## 2016-03-11 DIAGNOSIS — L03115 Cellulitis of right lower limb: Secondary | ICD-10-CM

## 2016-03-11 LAB — GLUCOSE, CAPILLARY
Glucose-Capillary: 99 mg/dL (ref 65–99)
Glucose-Capillary: 99 mg/dL (ref 65–99)
Glucose-Capillary: 112 mg/dL — ABNORMAL HIGH (ref 65–99)

## 2016-03-11 LAB — CBC
HCT: 31 % — ABNORMAL LOW (ref 39.0–52.0)
Hemoglobin: 10.4 g/dL — ABNORMAL LOW (ref 13.0–17.0)
MCH: 27.1 pg (ref 26.0–34.0)
MCHC: 33.5 g/dL (ref 30.0–36.0)
MCV: 80.7 fL (ref 78.0–100.0)
Platelets: 318 10*3/uL (ref 150–400)
RBC: 3.84 MIL/uL — ABNORMAL LOW (ref 4.22–5.81)
RDW: 16.7 % — ABNORMAL HIGH (ref 11.5–15.5)
WBC: 13.2 10*3/uL — ABNORMAL HIGH (ref 4.0–10.5)

## 2016-03-11 LAB — BASIC METABOLIC PANEL
Anion gap: 11 (ref 5–15)
BUN: 34 mg/dL — ABNORMAL HIGH (ref 6–20)
CO2: 27 mmol/L (ref 22–32)
Calcium: 8.2 mg/dL — ABNORMAL LOW (ref 8.9–10.3)
Chloride: 95 mmol/L — ABNORMAL LOW (ref 101–111)
Creatinine, Ser: 6.94 mg/dL — ABNORMAL HIGH (ref 0.61–1.24)
GFR calc Af Amer: 9 mL/min — ABNORMAL LOW (ref >60)
GFR calc non Af Amer: 8 mL/min — ABNORMAL LOW (ref >60)
Glucose, Bld: 86 mg/dL (ref 65–99)
Potassium: 4.3 mmol/L (ref 3.5–5.1)
Sodium: 133 mmol/L — ABNORMAL LOW (ref 135–145)

## 2016-03-11 LAB — PROTIME-INR
INR: 3.99
Prothrombin Time: 39.9 seconds — ABNORMAL HIGH (ref 11.4–15.2)

## 2016-03-11 MED ORDER — HEPARIN SODIUM (PORCINE) 1000 UNIT/ML IJ SOLN
INTRAMUSCULAR | Status: AC
  Filled 2016-03-11: qty 4

## 2016-03-11 MED ORDER — POVIDONE-IODINE 10 % EX SOLN
CUTANEOUS | Status: AC
  Filled 2016-03-11: qty 118

## 2016-03-11 NOTE — Progress Notes (Signed)
[place] for HEPARIN (d/w Dr Wyline Copas) Indication: VTE prophylaxis, h/o DVT, chronic Coumadin from home  Allergies  Allergen Reactions  . No Known Allergies    Patient Measurements: Height: 6' 3.5" (191.8 cm) Weight: 251 lb 1.6 oz (113.9 kg) IBW/kg (Calculated) : 85.65  Vital Signs: Temp: 98.5 F (36.9 C) (10/11 0551) Temp Source: Oral (10/11 0551) BP: 98/49 (10/11 0551) Pulse Rate: 98 (10/11 0551)  Labs:  Recent Labs  03/09/16 0658 03/10/16 0552 03/11/16 0539  HGB 10.5* 10.5* 10.4*  HCT 30.7* 31.1* 31.0*  PLT 245 251 318  LABPROT 36.0* 39.2* 39.9*  INR 3.50 3.90 3.99  CREATININE 7.67* 5.81* 6.94*   Estimated Creatinine Clearance: 15.1 mL/min (by C-G formula based on SCr of 6.94 mg/dL (H)).  Medical History: Past Medical History:  Diagnosis Date  . Anemia   . Arthritis    HNP- lumbar, "all over my body"  . Blood transfusion    "years ago; blood was low" (08/05/2013)  . CKD (chronic kidney disease) stage 4, GFR 15-29 ml/min (HCC) 03/18/2012   [place]- T,TH,Sat.  . Diabetic nephropathy ([place])   . Diabetic retinopathy   . DVT (deep venous thrombosis) ([place])    "got one in my right leg now; I've had one before too, not sure which leg" (08/05/2013)  . ESRD (end stage renal disease) on dialysis [facility])    "just started today, (08/04/2013)"  . Family history of anesthesia complication    " my son wakes up slowly"  . GERD (gastroesophageal reflux disease)    uses alka seltzere on occas.   [name])    "one q now and then" (08/05/2013)  . Hyperlipidemia   . Hypertension   . 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 Non Hodgkin's lymphoma ([place])    Tx 2009; "had chemo; it went away" (08/05/2013)  . Noncompliance 03/16/2012  . NSVT (nonsustained ventricular tachycardia) ([place]) 03/18/2012  . Peripheral vascular disease ([place])   . Pneumonia  2013   hosp.-   . Poor historian    pt. unsure of several answers to health history questions   . Skin cancer    melanoma - head  . Sleep apnea    "suppose to have a sleep study, but they never told me when. (08/05/2013)   Medications:  Prescriptions Prior to Admission  Medication Sig Dispense Refill Last Dose  . acetaminophen (TYLENOL) 325 MG tablet Take 650 mg by mouth every 6 (six) hours as needed (pain).   Past Week at Unknown time  . amlodipine-benazepril (LOTREL) 2.5-10 MG capsule Take 1 capsule by mouth daily.   03/04/2016 at Unknown time  . diclofenac sodium (VOLTAREN) 1 % GEL Apply topically 4 (four) times daily.   Past Week at Unknown time  . ergocalciferol (VITAMIN D2) 50000 units capsule Take 50,000 Units by mouth once a week.   Past Week at Unknown time  . insulin glargine (LANTUS) 100 UNIT/ML injection Inject 0.3 mLs (30 Units total) into the skin at bedtime. 10 mL 11 Past Week at Unknown time  . insulin lispro (HUMALOG) 100 UNIT/ML injection Inject 10 Units into the skin 3 (three) times daily with meals.    03/04/2016 at Unknown time  . lidocaine-prilocaine (EMLA) cream Apply 1 application topically as needed (Apply small amount to access site 1-2 hours before dialysis. Cover with occlusive dressing (saran wrap)).  Past Month at Unknown time  . multivitamin (RENA-VIT) TABS tablet Take 1 tablet by mouth at bedtime. 30 tablet 0 Past Week at Unknown time  . oxyCODONE-acetaminophen (PERCOCET/ROXICET) 5-325 MG tablet Take 1 tablet by mouth every 6 (six) hours as needed for moderate pain. 30 tablet 0 03/04/2016 at Unknown time  . polysaccharide iron (NIFEREX) 150 MG CAPS capsule Take 1 capsule (150 mg total) by mouth daily. 30 each 2 Past Week at Unknown time  . sevelamer carbonate (RENVELA) 800 MG tablet Take 1,600 mg by mouth 3 (three) times daily with meals.   Past Week at Unknown time  . warfarin (COUMADIN) 5 MG tablet Take 1.5 tablets (7.5 mg total) by mouth at bedtime. (Patient  taking differently: Take 7.5-10 mg by mouth at bedtime. Take 1.5 tablets on Tuesday,Wed, and Thurs, Saturday, and Sunday.  Take 2 tablets on Monday and Friday) 30 tablet 1 03/04/2016 at Unknown time   Assessment: 65yo male on chronic Coumadin due to h/o DVT.  Pt's home dose listed above but reportedly dose has changed and pt not always compliant with ordered dose.  INR is SUPRAtherapeutic today.  CBC appears stable.  No bleeding reported.  D/W Dr Wyline Copas, plan to stop Coumadin and resume Heparin when INR < 2.    Goal of Therapy:  INR 2-3 Monitor platelets by anticoagulation protocol: Yes   Plan:  HOLD coumadin today, allow INR to trend down Start IV Heparin when INR < 2 INR daily, monitor CBC Monitor for s/sx of bleeding complications.    Hart Robinsons A 03/11/2016,3:31 PM

## 2016-03-11 NOTE — Progress Notes (Signed)
Pt currently in dialysis. Expected end time of dialysis  2045. Report given to [facility] . [place]. All questions were answered and no further questions at this time.

## 2016-03-11 NOTE — Progress Notes (Signed)
[place] for COUMADIN (chronic Rx PTA) Indication: VTE prophylaxis, h/o DVT, chronic Coumadin from home  Allergies  Allergen Reactions  . No Known Allergies    Patient Measurements: Height: 6' 3.5" (191.8 cm) Weight: 251 lb 1.6 oz (113.9 kg) IBW/kg (Calculated) : 85.65  Vital Signs: Temp: 98.5 F (36.9 C) (10/11 0551) Temp Source: Oral (10/11 0551) BP: 98/49 (10/11 0551) Pulse Rate: 98 (10/11 0551)  Labs:  Recent Labs  03/09/16 0658 03/10/16 0552 03/11/16 0539  HGB 10.5* 10.5* 10.4*  HCT 30.7* 31.1* 31.0*  PLT 245 251 318  LABPROT 36.0* 39.2* 39.9*  INR 3.50 3.90 3.99  CREATININE 7.67* 5.81* 6.94*   Estimated Creatinine Clearance: 15.1 mL/min (by C-G formula based on SCr of 6.94 mg/dL (H)).  Medical History: Past Medical History:  Diagnosis Date  . Anemia   . Arthritis    HNP- lumbar, "all over my body"  . Blood transfusion    "years ago; blood was low" (08/05/2013)  . CKD (chronic kidney disease) stage 4, GFR 15-29 ml/min (HCC) 03/18/2012   [place]- T,TH,Sat.  . Diabetic nephropathy ([place])   . Diabetic retinopathy   . DVT (deep venous thrombosis) ([place])    "got one in my right leg now; I've had one before too, not sure which leg" (08/05/2013)  . ESRD (end stage renal disease) on dialysis [facility])    "just started today, (08/04/2013)"  . Family history of anesthesia complication    " my son wakes up slowly"  . GERD (gastroesophageal reflux disease)    uses alka seltzere on occas.   [name])    "one q now and then" (08/05/2013)  . Hyperlipidemia   . Hypertension   . 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 Non Hodgkin's lymphoma ([place])    Tx 2009; "had chemo; it went away" (08/05/2013)  . Noncompliance 03/16/2012  . NSVT (nonsustained ventricular tachycardia) ([place]) 03/18/2012  . Peripheral vascular disease ([place])   .  Pneumonia 2013   hosp.-   . Poor historian    pt. unsure of several answers to health history questions   . Skin cancer    melanoma - head  . Sleep apnea    "suppose to have a sleep study, but they never told me when. (08/05/2013)   Medications:  Prescriptions Prior to Admission  Medication Sig Dispense Refill Last Dose  . acetaminophen (TYLENOL) 325 MG tablet Take 650 mg by mouth every 6 (six) hours as needed (pain).   Past Week at Unknown time  . amlodipine-benazepril (LOTREL) 2.5-10 MG capsule Take 1 capsule by mouth daily.   03/04/2016 at Unknown time  . diclofenac sodium (VOLTAREN) 1 % GEL Apply topically 4 (four) times daily.   Past Week at Unknown time  . ergocalciferol (VITAMIN D2) 50000 units capsule Take 50,000 Units by mouth once a week.   Past Week at Unknown time  . insulin glargine (LANTUS) 100 UNIT/ML injection Inject 0.3 mLs (30 Units total) into the skin at bedtime. 10 mL 11 Past Week at Unknown time  . insulin lispro (HUMALOG) 100 UNIT/ML injection Inject 10 Units into the skin 3 (three) times daily with meals.    03/04/2016 at Unknown time  . lidocaine-prilocaine (EMLA) cream Apply 1 application topically as needed (Apply small amount to access site 1-2 hours before dialysis. Cover with occlusive dressing (saran wrap)).  Past Month at Unknown time  . multivitamin (RENA-VIT) TABS tablet Take 1 tablet by mouth at bedtime. 30 tablet 0 Past Week at Unknown time  . oxyCODONE-acetaminophen (PERCOCET/ROXICET) 5-325 MG tablet Take 1 tablet by mouth every 6 (six) hours as needed for moderate pain. 30 tablet 0 03/04/2016 at Unknown time  . polysaccharide iron (NIFEREX) 150 MG CAPS capsule Take 1 capsule (150 mg total) by mouth daily. 30 each 2 Past Week at Unknown time  . sevelamer carbonate (RENVELA) 800 MG tablet Take 1,600 mg by mouth 3 (three) times daily with meals.   Past Week at Unknown time  . warfarin (COUMADIN) 5 MG tablet Take 1.5 tablets (7.5 mg total) by mouth at bedtime.  (Patient taking differently: Take 7.5-10 mg by mouth at bedtime. Take 1.5 tablets on Tuesday,Wed, and Thurs, Saturday, and Sunday.  Take 2 tablets on Monday and Friday) 30 tablet 1 03/04/2016 at Unknown time   Assessment: 65yo male on chronic Coumadin due to h/o DVT.  Pt's home dose listed above but reportedly dose has changed and pt not always compliant with ordered dose.  INR is SUPRAtherapeutic today.  CBC appears stable.  No bleeding reported.   Goal of Therapy:  INR 2-3 Monitor platelets by anticoagulation protocol: Yes   Plan:  HOLD coumadin today, allow INR to trend down INR daily Monitor for s/sx of bleeding complications.    [place] Crane, Nat Lowenthal A 03/11/2016,8:33 AM

## 2016-03-11 NOTE — Progress Notes (Signed)
SURGICAL PROGRESS NOTE (cpt [phone])  Hospital Day(s): 6.   Post op day(s):  Mar[facility] Kitchen   Interval History: Patient seen and examined, no acute events or new complaints overnight. Patient denies pain, fever/chills, CP, or SOB.  Review of Systems:  Constitutional: denies fever, chills  HEENT: denies cough or congestion  Respiratory: denies any shortness of breath  Cardiovascular: denies chest pain or palpitations  Gastrointestinal: denies N/V, diarrhea or constipation  Musculoskeletal: denies pain, decreased motor or sensation  Neurological: denies HA or vision/hearing changes   Vital signs in last 24 hours: [min-max] current  Temp:  [97.9 F (36.6 C)-98.5 F (36.9 C)] 98.5 F (36.9 C) (10/11 0551) Pulse Rate:  [98-100] 98 (10/11 0551) Resp:  [16] 16 (10/11 0551) BP: (98-129)/(40-49) 98/49 (10/11 0551) SpO2:  [100 %] 100 % (10/11 0551) Weight:  [113.9 kg (251 lb 1.6 oz)] 113.9 kg (251 lb 1.6 oz) (10/11 0551)     Height: 6' 3.5" (191.8 cm) Weight: 113.9 kg (251 lb 1.6 oz) BMI (Calculated): 31   Intake/Output this shift:  Total I/O In: 240 [P.O.:240] Out: -    Intake/Output last 2 shifts:  @IOLAST2SHIFTS @   Physical Exam:  Constitutional: alert, cooperative and no distress  HENT: normocephalic without obvious abnormality  Eyes: PERRL, EOM's grossly intact and symmetric  Neuro: CN II - XII grossly intact and symmetric without deficit  Respiratory: breathing non-labored at rest  Cardiovascular: regular rate and sinus rhythm  Gastrointestinal: soft, non-tender, and non-distended  Musculoskeletal: UE and LE FROM, B/L RLE >> LLE edema or wounds, foul-smelling necrosis of Right 3rd - 5th toes with fibrinous exudate +/- purulent drainage and partially mummified distal Right 3rd toe, avulsed 5th toe skin, NT, B/L severely impaired sensation from ankle level  Pulse/Doppler Exam: (p=palpable; d=doppler signals; 0=none)     Right   Left   AVF easily palpable minimally pulsatile  transposed Right brachial-basilic AV fistula    pulsatile Left forearm radial-cephalic AV fistula consistent with outflow thrombosis  Brach  p   p   Rad  p   p   Fem  p   p   DP  mono  triphasic  PT  triphasic triphasic  Per  triphasic triphasic   Labs:  CBC:  Lab Results  Component Value Date   WBC 13.2 (H) 03/11/2016   RBC 3.84 (L) 03/11/2016   BMP:  Lab Results  Component Value Date   GLUCOSE 86 03/11/2016   CO2 27 03/11/2016   BUN 34 (H) 03/11/2016   CREATININE 6.94 (H) 03/11/2016   CALCIUM 8.2 (L) 03/11/2016   CALCIUM 9.1 01/09/2013   Coagulation:  Lab Results  Component Value Date   INR 3.99 03/11/2016   APTT 52 (H) 03/05/2016     Imaging studies:  B/L lower extremity non-invasive physiologic vascular study (03/09/2016) - personally reviewed 1. Bilateral lower extremity arterial occlusive disease, at least moderate in severity on the left, indeterminate severity on the right due to vascular noncompressibility. Should the patient fail conservative treatment, consider CTA runoff (higher spatial resolution) or MRA runoff (no radiation risk, can be performed noncontrast in the setting of renal dysfunction) to better define the site and nature of arterial occlusive disease and delineate treatment options.  CTA Aorta + B/L Lower Extremity Runoff (03/10/2016) - personally reviewed Aorta: Normal caliber of the abdominal aorta without dissection. Mild atherosclerotic disease in the abdominal aorta.  RIGHT Lower Extremity Inflow: Right iliac arteries have calcified plaque without significant stenosis.  Outflow: Right common  femoral artery is widely patent with mild plaque. Right profunda femoral arteries are patent. Diffuse atherosclerotic disease in the right SFA without critical stenosis. Diffuse atherosclerotic disease in the right popliteal artery without critical stenosis.  Runoff: Runoff vessels are diffusely calcified. There is 3 vessel runoff in the right  lower extremity with a short-segment occlusion in the mid anterior tibial artery with distal reconstitution. There is flow at the posterior tibial artery and dorsalis pedis artery at the ankle.  LEFT Lower Extremity Inflow: Atherosclerotic calcifications in the left iliac arteries without significant stenosis.  Outflow: Left common femoral artery is widely patent. Left profunda femoral arteries are patent. Diffuse atherosclerotic disease in the left SFA without critical stenosis. Mild narrowing in the mid left popliteal artery.  Runoff: Runoff vessels are heavily calcified. There appears to be three-vessel runoff in the left lower extremity. Appears to be flow at the dorsalis pedis artery and posterior tibial artery at the ankle.  Assessment/Plan: (ICD-10's: E13.52) 65 y.o. Right-handed diabetic male with wet gangrene of at least his Right 3rd/4th/5th toes and leukocytosis on broad-spectrum IV antibiotics x 6 days, complicated by pertinent comorbidities including ESRD since 2015 on Tu/Th/Sa HD via tunneled Left IJ HD catheter s/p thrombosis of Left forearm radial-cephalic AV fistula (created 02/03/2013) and transposition (01/24/2016) of Right brachial-basilic AV fistula (created 11/08/2015), remote history of RLE DVT (2008 or 2009 per pt) treated since that time with Coumadin and s/p IVC filter insertion (2014) in preparation for lumbar spine surgery, RLE post-thrombotic syndrome, CAD with cardiac arrhythmia, PAD, HTN, HLD, non-Hodgkin's lymphoma in remission s/p chemotherapy, melanoma (head), sleep apnea, and GERD.   - check B/L lower extremity venous duplex for acute DVT   - if no evidence of acute DVT and no history of hypercoagulable diagnosis, stop Coumadin  - patient requires at least amputation of Right 3rd - 5th toes, likely open TMA + delayed closure  - patient requests transfer to [facility] due to many of his physicians being there, including his long-time podiatrist  - continue broad-spectrum IV  antibiotics and medical management as per medicine  All of the above findings and recommendations were discussed with the patient and the medical team, and all of patient's questions were answered to his expressed satisfaction.  -- Marilynne Drivers Rosana Hoes, MD, [place]: [facility] Office: [phone]

## 2016-03-11 NOTE — Progress Notes (Signed)
[name]  MRN: [id number]  DOB/AGE: [birth year] 65 y.o.  Primary Care Physician:ARONSON,RICHARD A, MD  Admit date: 03/05/2016  Chief Complaint:  Chief Complaint  Patient presents with  . Fever    S-Pt presented on  03/05/2016 with  Chief Complaint  Patient presents with  . Fever  .    Pt today feels better  Meds  . collagenase   Topical Daily  . insulin aspart  0-5 Units Subcutaneous QHS  . insulin aspart  0-9 Units Subcutaneous TID WC  . iron polysaccharides  150 mg Oral Daily  . multivitamin  1 tablet Oral QHS  . piperacillin-tazobactam (ZOSYN)  IV  3.375 g Intravenous Q12H  . sevelamer carbonate  800 mg Oral TID WC  . sodium chloride  1,000 mL Intravenous Once   And  . sodium chloride  1,000 mL Intravenous Once   And  . sodium chloride  1,000 mL Intravenous Once  . sodium chloride flush  3 mL Intravenous Q12H  . vancomycin  1,000 mg Intravenous Q M,W,F-HD  . Warfarin - Pharmacist Dosing Inpatient   Does not apply Q24H     Physical Exam: Vital signs in last 24 hours: Temp:  [97.9 F (36.6 C)-98.2 F (36.8 C)] 97.9 F (36.6 C) (10/10 2045) Pulse Rate:  [98-100] 100 (10/10 2045) Resp:  [16] 16 (10/10 2045) BP: (104-129)/(28-40) 129/40 (10/10 2045) SpO2:  [94 %-100 %] 100 % (10/10 2045) Weight change:  Last BM Date: 03/10/16  Intake/Output from previous day: 10/10 0701 - 10/11 0700 In: 240 [P.O.:240] Out: -  No intake/output data recorded.   Physical Exam: General- pt is awake,alert, oriented to time place and person Resp- No acute REsp distress, CTA B/L NO Rhonchi CVS- S1S2 regular in rate and rhythm GIT- BS+, soft, NT, ND EXT- 1+ LE Edema, Right foot in banadage Access- Left Tunneled cath                Right AVF   Lab Results: CBC  Recent Labs  03/09/16 0658 03/10/16 0552  WBC 11.1* 12.6*  HGB 10.5* 10.5*  HCT 30.7* 31.1*  PLT 245 251    BMET  Recent Labs  03/09/16 0658 03/10/16 0552  NA 133* 134*  K 4.3 3.9  CL 97* 97*   CO2 26 28  GLUCOSE 69 88  BUN 41* 27*  CREATININE 7.67* 5.81*  CALCIUM 8.1* 7.8*    MICRO Recent Results (from the past 240 hour(s))  Blood culture (routine x 2)     Status: None   Collection Time: 03/05/16  7:28 AM  Result Value Ref Range Status   Specimen Description BLOOD LEFT ARM  Final   Special Requests BOTTLES DRAWN AEROBIC ONLY 5CC  Final   Culture NO GROWTH 5 DAYS  Final   Report Status 03/10/2016 FINAL  Final  Blood culture (routine x 2)     Status: None   Collection Time: 03/05/16  7:28 AM  Result Value Ref Range Status   Specimen Description BLOOD LEFT HAND  Final   Special Requests   Final    BOTTLES DRAWN AEROBIC AND ANAEROBIC AEB=5CC ANA=3CC   Culture NO GROWTH 5 DAYS  Final   Report Status 03/10/2016 FINAL  Final  MRSA PCR Screening     Status: None   Collection Time: 03/05/16 10:47 AM  Result Value Ref Range Status   MRSA by PCR NEGATIVE NEGATIVE Final    Comment:        The GeneXpert MRSA  Assay (FDA approved for NASAL specimens only), is one component of a comprehensive MRSA colonization surveillance program. It is not intended to diagnose MRSA infection nor to guide or monitor treatment for MRSA infections.       Lab Results  Component Value Date   PTH 272.7 (H) 08/07/2013   CALCIUM 7.8 (L) 03/10/2016   PHOS 3.5 03/09/2016   Alb  2.1 Corrected calcium  1.5+ 7.8=9.3       Impression: 1)Renal  ESRD on HD                Pt is on Tuesday/Thursday/Saturday schedule as outpt               BUt as pt missed HD as out pt , he is on MWF schedule as inpt                Pt will be dialyzed today  2)HTN    BP stable          3)Anemia  In ESRD the goal for  HGb is 9--11 Will keep on epo during HD  4)CKD Mineral-Bone Disorder Secondary Hyperparathyroidism present  Phosphorus at goal. Calcium is at goal.  5)ID-admitted with sepsis        Cellulitis vs Gangrene        Pt is on broad spectrum abx        Primary MD  following  6)Electrolytes Normokalemic  Hyponatremic   7)Acid base Co2 at goal     Plan:  Will dialyze today Will use3 k bath Will keep on epo     BHUTANI,MANPREET S 03/11/2016, 5:38 AM

## 2016-03-11 NOTE — Progress Notes (Signed)
PROGRESS NOTE    [name]  LXB:[id number] DOB: 21-Sep-1950 DOA: 03/05/2016 PCP: Geoffery Lyons, MD    Brief Narrative:  Please see dictated H&P dated 03/05/2016 for details. 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 Patient had been followed closely by Dr. Paulla Dolly of [place] for chronic right foot wound.  Thus far during this hospital course, patient was continued on broad-spectrum antibiotics (vancomycin and Zosyn). MRI was obtained with no evidence of deeper infection including abscess or osteomyelitis. Patient's wounds continue to drain and general surgery was consulted. CT of the right leg was obtained and case was discussed with vascular surgery who felt patient was appropriate for amputation if indicated. Discussed case with general surgery who recommended discussing with patient's primary podiatrist. Discussed case with podiatry who has since recommended transfer to [facility] for formal evaluation and possible surgery. Transfer orders has since been placed.  Assessment & Plan:   Active Problems:   Diabetes mellitus ([place])   Anemia in chronic kidney disease   History of DVT of lower extremity   Hypertension   End stage renal disease ([place])   Severe sepsis ([place])   Cellulitis of right foot   Sepsis ([place])   1. Right foot cellulitis and gangrene with sepsis present on admission 1. Patient remains on vancomycin and Zosyn. We'll continue broad-spectrum antibiotics for now 2. Lactic acid noted to be within normal range 3. She presented hypotensive. Blood pressure remained soft but stable 4. I  have discussed case with general surgery who recommends deferring to patient's primary podiatrist for possible surgery and amputation. 5. Case was also discussed with patient's primary podiatrist, who recommends transfer to [facility] for formal podiatry evaluation will be done 6. Transfer orders has since been placed 2. End-stage renal disease 1. Patient normally dialyzes on Tuesdays Thursdays and Saturdays prior to hospital admission 2. Dialysis was held on 03/05/2016 secondary to presenting hypotension 3. Patient has since continued on a Monday Wednesday Friday schedule since admission 4. I've discussed case with nephrology. As patient is scheduled for dialysis today, plan to dialyze at this facility prior to transfer to [facility] 3. Hypertension 1. Blood pressure currently stable, albeit soft 2. We'll continue to monitor at this time 4. Prior history of DVT 1. Chart reviewed 2. Patient has a history of lower extremity DVT and had been on Coumadin 3. Per records, IVC filter was placed in May 2014 as anticoagulation needed to be discontinued for planned lumbar surgery 4. Patient's Coumadin has since been continued to this day. 5. Patient denies family history of clots. Patient also denies more than 1 thromboembolic episode in his lifetime 6. Question patient's need for further Coumadin therapy. Discussed this with vascular surgery who recommends lower extremity Dopplers. 7. We'll continue patient on therapeutic heparin for the time being and follow-up on lower extremity Doppler results, which I have ordered. 8. If patient does not have evidence of DVT, consider discontinuation of further anticoagulation 5. Chronic anticoagulation 1. Per above, patient had previously remained on therapeutic Coumadin for the past several years 2. Per above, follow-up on Doppler studies to determine if patient requires continued anticoagulation 3. For now, patient remains on  therapeutic  heparin 6. Diabetes mellitus 1. We'll continue supplemental sliding scale insulin 7. Anemia, suspected secondary to chronic renal disease 1. Follow CBC 2. Transfuse as needed  DVT prophylaxis: Therapeutic heparin Code Status: Full code Family Communication: Patient in room, family not at bedside Disposition Plan: Transfer to [facility]  Consultants:   Vascular surgery  General surgery  Podiatry  Nephrology  Procedures:     Antimicrobials: Anti-infectives    Start     Dose/Rate Route Frequency Ordered Stop   03/07/16 1300  vancomycin (VANCOCIN) IVPB 1000 mg/200 mL premix     1,000 mg 200 mL/hr over 60 Minutes Intravenous  Once 03/07/16 1145 03/07/16 1417   03/06/16 1200  vancomycin (VANCOCIN) IVPB 1000 mg/200 mL premix     1,000 mg 200 mL/hr over 60 Minutes Intravenous Every M-W-F (Hemodialysis) 03/06/16 1052     03/05/16 2000  piperacillin-tazobactam (ZOSYN) IVPB 3.375 g     3.375 g 12.5 mL/hr over 240 Minutes Intravenous Every 12 hours 03/05/16 1015     03/05/16 1115  piperacillin-tazobactam (ZOSYN) IVPB 3.375 g  Status:  Discontinued     3.375 g 100 mL/hr over 30 Minutes Intravenous  Once 03/05/16 1109 03/05/16 1111   03/05/16 1115  vancomycin (VANCOCIN) IVPB 1000 mg/200 mL premix  Status:  Discontinued     1,000 mg 200 mL/hr over 60 Minutes Intravenous  Once 03/05/16 1109 03/05/16 1112   03/05/16 0745  piperacillin-tazobactam (ZOSYN) IVPB 3.375 g     3.375 g 100 mL/hr over 30 Minutes Intravenous  Once 03/05/16 0730 03/05/16 0811   03/05/16 0745  vancomycin (VANCOCIN) 2,000 mg in sodium chloride 0.9 % 500 mL IVPB     2,000 mg 250 mL/hr over 120 Minutes Intravenous  Once 03/05/16 0732 03/05/16 1053       Subjective: No complaints at this time  Objective: Vitals:   03/10/16 0635 03/10/16 2045 03/11/16 0551 03/11/16 1531  BP: (!) 104/28 (!) 129/40 (!) 98/49 (!) 100/51  Pulse: 98 100 98 89  Resp:  16 16 16   Temp: 98.2 F (36.8 C) 97.9 F (36.6  C) 98.5 F (36.9 C) 98.3 F (36.8 C)  TempSrc: Oral Oral Oral   SpO2: 94% 100% 100% 100%  Weight:   113.9 kg (251 lb 1.6 oz)   Height:   6' 3.5" (1.918 m)     Intake/Output Summary (Last 24 hours) at 03/11/16 1541 Last data filed at 03/11/16 1300  Gross per 24 hour  Intake              720 ml  Output                0 ml  Net              720 ml   Filed Weights   03/09/16 2210 03/10/16 0216 03/11/16 0551  Weight: 119.3 kg (263 lb 0.1 oz) 117 kg (257 lb 15 oz) 113.9 kg (251 lb 1.6 oz)    Examination:  General exam: Appears calm and comfortable  Respiratory system: Clear to auscultation. Respiratory effort normal. Cardiovascular system: S1 & S2 heard, RRR. Gastrointestinal system: Abdomen is nondistended, soft and nontender. No organomegaly or masses felt. Normal bowel sounds heard. Central nervous system: Alert and oriented. No focal neurological deficits. Extremities: Symmetric 5 x 5 power, edematous lower extremities bilaterally right greater than left. Skin: No rashes, dressings over right ankle. Dressings appear dry and intact Psychiatry: Judgement and insight appear normal. Mood & affect  appropriate.   Data Reviewed: I have personally reviewed following labs and imaging studies  CBC:  Recent Labs Lab 03/05/16 0727  03/08/16 0156 03/08/16 0726 03/09/16 0658 03/10/16 0552 03/11/16 0539  WBC 14.6*  < > 11.4* 10.8* 11.1* 12.6* 13.2*  NEUTROABS 13.0*  --   --   --   --   --   --   HGB 10.6*  < > 10.3* 10.9* 10.5* 10.5* 10.4*  HCT 31.7*  < > 30.6* 32.7* 30.7* 31.1* 31.0*  MCV 84.3  < > 82.3 82.8 81.4 82.3 80.7  PLT 217  < > 232 255 245 251 318  < > = values in this interval not displayed. Basic Metabolic Panel:  Recent Labs Lab 03/08/16 0156 03/08/16 0726 03/09/16 0658 03/10/16 0552 03/11/16 0539  NA 132* 134* 133* 134* 133*  K 3.9 4.1 4.3 3.9 4.3  CL 96* 97* 97* 97* 95*  CO2 27 27 26 28 27   GLUCOSE 78 78 69 88 86  BUN 29* 32* 41* 27* 34*  CREATININE  6.45* 6.69* 7.67* 5.81* 6.94*  CALCIUM 8.0* 8.3* 8.1* 7.8* 8.2*  PHOS 3.3 3.3 3.5  --   --    GFR: Estimated Creatinine Clearance: 15.1 mL/min (by C-G formula based on SCr of 6.94 mg/dL (H)). Liver Function Tests:  Recent Labs Lab 03/06/16 0529 03/08/16 0156 03/08/16 0726 03/09/16 0658  AST 25  --   --   --   ALT 14*  --   --   --   ALKPHOS 136*  --   --   --   BILITOT 4.0*  --   --   --   PROT 6.3*  --   --   --   ALBUMIN 2.2* 2.1* 2.2* 2.1*   No results for input(s): LIPASE, AMYLASE in the last 168 hours. No results for input(s): AMMONIA in the last 168 hours. Coagulation Profile:  Recent Labs Lab 03/07/16 1000 03/08/16 0726 03/09/16 0658 03/10/16 0552 03/11/16 0539  INR 2.09 2.60 3.50 3.90 3.99   Cardiac Enzymes: No results for input(s): CKTOTAL, CKMB, CKMBINDEX, TROPONINI in the last 168 hours. BNP (last 3 results) No results for input(s): PROBNP in the last 8760 hours. HbA1C: No results for input(s): HGBA1C in the last 72 hours. CBG:  Recent Labs Lab 03/10/16 1121 03/10/16 1641 03/10/16 2042 03/11/16 0742 03/11/16 1151  GLUCAP 91 112* 123* 99 99   Lipid Profile: No results for input(s): CHOL, HDL, LDLCALC, TRIG, CHOLHDL, LDLDIRECT in the last 72 hours. Thyroid Function Tests: No results for input(s): TSH, T4TOTAL, FREET4, T3FREE, THYROIDAB in the last 72 hours. Anemia Panel: No results for input(s): VITAMINB12, FOLATE, FERRITIN, TIBC, IRON, RETICCTPCT in the last 72 hours. Sepsis Labs:  Recent Labs Lab 03/05/16 0740 03/05/16 1134 03/05/16 1355  PROCALCITON  --  8.00  --   LATICACIDVEN 1.80 1.2 0.9    Recent Results (from the past 240 hour(s))  Blood culture (routine x 2)     Status: None   Collection Time: 03/05/16  7:28 AM  Result Value Ref Range Status   Specimen Description BLOOD LEFT ARM  Final   Special Requests BOTTLES DRAWN AEROBIC ONLY 5CC  Final   Culture NO GROWTH 5 DAYS  Final   Report Status 03/10/2016 FINAL  Final  Blood  culture (routine x 2)     Status: None   Collection Time: 03/05/16  7:28 AM  Result Value Ref Range Status   Specimen Description BLOOD LEFT HAND  Final   Special Requests   Final    BOTTLES DRAWN AEROBIC AND ANAEROBIC AEB=5CC ANA=3CC   Culture NO GROWTH 5 DAYS  Final   Report Status 03/10/2016 FINAL  Final  MRSA PCR Screening     Status: None   Collection Time: 03/05/16 10:47 AM  Result Value Ref Range Status   MRSA by PCR NEGATIVE NEGATIVE Final    Comment:        The GeneXpert MRSA Assay (FDA approved for NASAL specimens only), is one component of a comprehensive MRSA colonization surveillance program. It is not intended to diagnose MRSA infection nor to guide or monitor treatment for MRSA infections.      Radiology Studies: Ct Angio Ao+bifem W &/or Wo Contrast  Result Date: 03/10/2016 CLINICAL DATA:  65 year old with right leg and foot swelling. History of melanoma, peripheral vascular disease and hemodialysis. History of non-Hodgkin's lymphoma. EXAM: CT ANGIOGRAPHY OF ABDOMINAL AORTA WITH ILIOFEMORAL RUNOFF TECHNIQUE: Multidetector CT imaging of the abdomen, pelvis and lower extremities was performed using the standard protocol during bolus administration of intravenous contrast. Multiplanar CT image reconstructions and MIPs were obtained to evaluate the vascular anatomy. CONTRAST:  150 mL Isovue 370 COMPARISON:  PET-CT dated 03/22/2015 FINDINGS: VASCULAR Aorta: Normal caliber of the abdominal aorta without dissection. Mild atherosclerotic disease in the abdominal aorta. Celiac: Patent without evidence of aneurysm, dissection, vasculitis or significant stenosis. SMA: Patent without evidence of aneurysm, dissection, vasculitis or significant stenosis. Renals: Bilateral renal arteries are patent. No significant renal artery stenosis. IMA: Patent with calcified plaque. RIGHT Lower Extremity Inflow: Right iliac arteries have calcified plaque without significant stenosis. Outflow: Right  common femoral artery is widely patent with mild plaque. Right profunda femoral arteries are patent. Diffuse atherosclerotic disease in the right SFA without critical stenosis. Diffuse atherosclerotic disease in the right popliteal artery without critical stenosis. Runoff: Runoff vessels are diffusely calcified. There is 3 vessel runoff in the right lower extremity with a short-segment occlusion in the mid anterior tibial artery with distal reconstitution. There is flow at the posterior tibial artery and dorsalis pedis artery at the ankle. LEFT Lower Extremity Inflow: Atherosclerotic calcifications in the left iliac arteries without significant stenosis. Outflow: Left common femoral artery is widely patent. Left profunda femoral arteries are patent. Diffuse atherosclerotic disease in the left SFA without critical stenosis. Mild narrowing in the mid left popliteal artery. Runoff: Runoff vessels are heavily calcified. There appears to be three-vessel runoff in the left lower extremity. Appears to be flow at the dorsalis pedis artery and posterior tibial artery at the ankle. Veins: IVC filter positioned below the renal veins. Cannot evaluate for IVC patency on this arterial phase of imaging. Review of the MIP images confirms the above findings. NON-VASCULAR Lower chest: Small pleural effusions, right side greater than left. Mild edema or atelectasis at both lung bases. Central venous catheter extending into the right atrium. Hepatobiliary: Limited evaluation on this arterial phase imaging. There is large gallstone measuring up the 3.3 cm. Difficult to evaluate for any gallbladder wall thickening or inflammation due to large amount of perihepatic ascites. Pancreas: Limited evaluation but no gross abnormality. Spleen: Normal appearance of spleen without enlargement. Adrenals/Urinary Tract: No gross abnormality to the left adrenal gland. Right adrenal gland is poorly visualized. No evidence for hydronephrosis. Urinary  bladder is decompressed. Stomach/Bowel: No gross abnormality to the stomach, small or large bowel. No evidence for bowel obstruction. Lymphatic: Limited evaluation for abdominal or pelvic lymphadenopathy. There appears to be small lymph nodes  in the left periaortic region. There are markedly enlarged lymph nodes in the inguinal regions, right side greater than left. These large inguinal lymph nodes may be reactive. Index right inguinal lymph node measures 2.4 cm in short axis on sequence 5, 184, previously measured 1.9 cm in the short axis. Reproductive: No gross abnormality to the prostate. Other: Subcutaneous edema in both lower extremities, right side greater than left. Soft tissue calcifications in the posterior thigh musculature bilaterally. Large amount of ascites throughout the abdomen and pelvis. The ascites has clearly progressed since 03/22/2015. Musculoskeletal: Cortical thickening in the proximal right fibula probably related to an old injury. There is a fracture involving the distal left fibula. Callus formation along the posterior aspect of this fracture suggesting a subacute injury or incomplete healing. Significant disc and endplate disease at F5-D3. IMPRESSION: VASCULAR Calcified atherosclerotic disease throughout the aorta and lower extremities bilaterally. There is no significant inflow or outflow disease. Evaluation of the runoff vessels is limited due to the heavily calcified vessels but there is flow to both ankles. There is a short-segment occlusion of the right anterior tibial artery. IVC filter is present. NON-VASCULAR Large volume ascites. Findings could be related to underlying liver disease but the liver is poorly characterized on this examination. Cholelithiasis. Small pleural effusions with dependent pulmonary edema or atelectasis. Diffuse subcutaneous edema throughout the lower extremities, right side greater than left. Enlarged inguinal lymph nodes are probably reactive in etiology.  Recommend monitoring this lymphadenopathy based on the history of lymphoma. Age-indeterminate fracture in the distal left fibula. This fracture has lucency with a small amount of callus formation. This could represent a subacute injury versus a nonunion. Electronically Signed   By: Markus Daft [name]   On: 03/10/2016 12:53    Scheduled Meds: . collagenase   Topical Daily  . insulin aspart  0-5 Units Subcutaneous QHS  . insulin aspart  0-9 Units Subcutaneous TID WC  . iron polysaccharides  150 mg Oral Daily  . multivitamin  1 tablet Oral QHS  . piperacillin-tazobactam (ZOSYN)  IV  3.375 g Intravenous Q12H  . sevelamer carbonate  800 mg Oral TID WC  . sodium chloride  1,000 mL Intravenous Once   And  . sodium chloride  1,000 mL Intravenous Once   And  . sodium chloride  1,000 mL Intravenous Once  . sodium chloride flush  3 mL Intravenous Q12H  . vancomycin  1,000 mg Intravenous Q M,W,F-HD   Continuous Infusions:    LOS: 6 days   CHIU, Orpah Melter, MD [facility] Pager [phone]  If 7PM-7AM, please contact night-coverage [email or web address] Password [email or web address] 03/11/2016, 3:41 PM

## 2016-03-12 DIAGNOSIS — I96 Gangrene, not elsewhere classified: Secondary | ICD-10-CM

## 2016-03-12 DIAGNOSIS — M216X1 Other acquired deformities of right foot: Secondary | ICD-10-CM

## 2016-03-12 DIAGNOSIS — E119 Type 2 diabetes mellitus without complications: Secondary | ICD-10-CM

## 2016-03-12 DIAGNOSIS — L03115 Cellulitis of right lower limb: Secondary | ICD-10-CM

## 2016-03-12 LAB — BASIC METABOLIC PANEL
Anion gap: 13 (ref 5–15)
BUN: 19 mg/dL (ref 6–20)
CO2: 24 mmol/L (ref 22–32)
Calcium: 8.2 mg/dL — ABNORMAL LOW (ref 8.9–10.3)
Chloride: 96 mmol/L — ABNORMAL LOW (ref 101–111)
Creatinine, Ser: 5.56 mg/dL — ABNORMAL HIGH (ref 0.61–1.24)
GFR calc Af Amer: 11 mL/min — ABNORMAL LOW (ref >60)
GFR calc non Af Amer: 10 mL/min — ABNORMAL LOW (ref >60)
Glucose, Bld: 89 mg/dL (ref 65–99)
Potassium: 4.5 mmol/L (ref 3.5–5.1)
Sodium: 133 mmol/L — ABNORMAL LOW (ref 135–145)

## 2016-03-12 LAB — CBC
HCT: 32.5 % — ABNORMAL LOW (ref 39.0–52.0)
Hemoglobin: 11 g/dL — ABNORMAL LOW (ref 13.0–17.0)
MCH: 27.2 pg (ref 26.0–34.0)
MCHC: 33.8 g/dL (ref 30.0–36.0)
MCV: 80.2 fL (ref 78.0–100.0)
Platelets: 315 10*3/uL (ref 150–400)
RBC: 4.05 MIL/uL — ABNORMAL LOW (ref 4.22–5.81)
RDW: 16.9 % — ABNORMAL HIGH (ref 11.5–15.5)
WBC: 10.3 10*3/uL (ref 4.0–10.5)

## 2016-03-12 LAB — GLUCOSE, CAPILLARY
Glucose-Capillary: 188 mg/dL — ABNORMAL HIGH (ref 65–99)
Glucose-Capillary: 69 mg/dL (ref 65–99)
Glucose-Capillary: 82 mg/dL (ref 65–99)
Glucose-Capillary: 88 mg/dL (ref 65–99)
Glucose-Capillary: 95 mg/dL (ref 65–99)
Glucose-Capillary: 97 mg/dL (ref 65–99)

## 2016-03-12 LAB — PROTIME-INR
INR: 3.37
Prothrombin Time: 34.9 seconds — ABNORMAL HIGH (ref 11.4–15.2)

## 2016-03-12 MED ORDER — DOXERCALCIFEROL 4 MCG/2ML IV SOLN
2.0000 ug | INTRAVENOUS | Status: DC
  Administered 2016-03-13: 2 ug via INTRAVENOUS
  Filled 2016-03-12 (×2): qty 2

## 2016-03-12 MED ORDER — VITAMIN K1 10 MG/ML IJ SOLN
5.0000 mg | Freq: Once | INTRAVENOUS | Status: AC
  Administered 2016-03-12: 5 mg via INTRAVENOUS
  Filled 2016-03-12: qty 0.5

## 2016-03-12 MED ORDER — HEPARIN SODIUM (PORCINE) 5000 UNIT/ML IJ SOLN: 5000.0000 [IU] | Freq: Three times a day (TID) | INTRAMUSCULAR | Status: DC

## 2016-03-12 NOTE — Consult Note (Signed)
[facility] Renal Consultation Note    Indication for Consultation:  Management of ESRD/hemodialysis, anemia, hypertension/volume, and secondary hyperparathyroidism. PCP:  HPI: [name] is a 65 y.o. male with ESRD, DM with neuropathy, GERD, HTN, HL, Hx DVT with IVC filter, Hx Non-Hodgkins lymphoma (s/p chemo) who was admitted to [facility] on 10/5 for sepsis related to R foot cellulitis/gangrene. He was started on Vanc/Zosyn and has had extensive evaluation showing PAD with heavily calcified arteries in the R leg, there was recommendation for amputation of at least 3 toes, possibly more. He was transferred to [facility] on 10/11 for further evaluation by his primary podiatrist and likely surgery.  Dialyzes TTS at [facility]. While at [facility], he was dialyzed per MWF schedule, last HD 10/11. He has been using his R AVF for dialysis recently.  Currently, denies chest pain or dyspnea. Has had mid abdominal pain associated with old hernia and having some diarrhea. Afebrile currently. No N/V or any other symptoms currently.  Past Medical History:  Diagnosis Date  . Anemia   . Arthritis    HNP- lumbar, "all over my body"  . Blood transfusion    "years ago; blood was low" (08/05/2013)  . CKD (chronic kidney disease) stage 4, GFR 15-29 ml/min (HCC) 03/18/2012   [place]- T,TH,Sat.  . Diabetic nephropathy ([place])   . Diabetic retinopathy   . DVT (deep venous thrombosis) ([place])    "got one in my right leg now; I've had one before too, not sure which leg" (08/05/2013)  . ESRD (end stage renal disease) on dialysis [facility])    "just started today, (08/04/2013)"  . Family history of anesthesia complication    " my son wakes up slowly"  . GERD (gastroesophageal reflux disease)    uses alka seltzere on occas.   [name])    "one q now and then" (08/05/2013)  . Hyperlipidemia   . Hypertension   . 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 Non Hodgkin's lymphoma ([place])    Tx 2009; "had chemo; it went away" (08/05/2013)  . Noncompliance 03/16/2012  . NSVT (nonsustained ventricular tachycardia) ([place]) 03/18/2012  . Peripheral vascular disease ([place])   . Pneumonia 2013   hosp.-   . Poor historian    pt. unsure of several answers to health history questions   . Skin cancer    melanoma - head  . Sleep apnea    "suppose to have a sleep study, but they never told me when. (08/05/2013)   Past Surgical History:  Procedure Laterality Date  . AV FISTULA PLACEMENT Left 02/03/2013   Procedure: ARTERIOVENOUS (AV) FISTULA CREATION- LEFT RADIAL CEPHALIC; ULTRASOUND GUIDED;  Surgeon: Mal Misty, MD;  Location: [facility] OR;  Service: Vascular;  Laterality: Left;  . AV FISTULA PLACEMENT Right 11/08/2015   Procedure: RIGHT BRACHIOCEPHALIC ARTERIOVENOUS (AV) FISTULA CREATION;  Surgeon: Serafina Mitchell, MD;  Location: [place];  Service: Vascular;  Laterality: Right;  . BASCILIC VEIN TRANSPOSITION Right 01/24/2016   Procedure: RIGHT SECOND STAGE BASILIC VEIN TRANSPOSITION;  Surgeon: Angelia Mould, MD;  Location: [place];  Service: Vascular;  Laterality: Right;  . CARDIAC CATHETERIZATION    . COLONOSCOPY N/A 09/23/2015   Procedure: COLONOSCOPY;  Surgeon: Irene Shipper, MD;  Location: [facility];  Service: Endoscopy;  Laterality: N/A;  . Coloscopy    . EYE SURGERY Bilateral   .  GAS INSERTION  05/10/2012   Procedure: INSERTION OF GAS;  Surgeon: Hayden Pedro, MD;  Location: [place];  Service: Ophthalmology;  Laterality: Right;  . LESION EXCISION Right 05/08/2015   Procedure: EXCISION SCALP LESION;  Surgeon: Erroll Luna, MD;  Location: [place];  Service: General;  Laterality: Right;  . MEMBRANE PEEL  05/10/2012   Procedure: MEMBRANE PEEL;  Surgeon: Hayden Pedro, MD;  Location: [place];  Service: Ophthalmology;  Laterality: Right;  . PARS PLANA VITRECTOMY  08/27/2011   Procedure: PARS  PLANA VITRECTOMY WITH 25 GAUGE;  Surgeon: Hayden Pedro, MD;  Location: [place];  Service: Ophthalmology;  Laterality: Left;  Repair of complex traction retinal detachment left eye  . PARS PLANA VITRECTOMY  05/10/2012   Procedure: PARS PLANA VITRECTOMY WITH 25 GAUGE;  Surgeon: Hayden Pedro, MD;  Location: [place];  Service: Ophthalmology;  Laterality: Right;  Repair Complex Traction Retinal Detachment  . PHOTOCOAGULATION WITH LASER  05/10/2012   Procedure: PHOTOCOAGULATION WITH LASER;  Surgeon: Hayden Pedro, MD;  Location: [place];  Service: Ophthalmology;  Laterality: Right;  . PORT-A-CATH REMOVAL    . PORTACATH PLACEMENT    . VENA CAVA FILTER PLACEMENT  09/2012   due to preparation for surgery   Family History  Problem Relation Age of Onset  . Anesthesia problems Son   . Hypertension Son   . Diabetes Father   . Hypertension Father   . Other Father     amputation  . Colon cancer Neg Hx    Social History:  reports that he has never smoked. He has never used smokeless tobacco. He reports that he does not drink alcohol or use drugs.  ROS: As per HPI otherwise negative.  Physical Exam: Vitals:   03/11/16 2158 03/12/16 0022 03/12/16 0438 03/12/16 0923  BP: (!) 105/43 (!) 103/33 (!) 103/32 (!) 126/58  Pulse: 100 (!) 107 (!) 104 96  Resp: 16 17 16 18   Temp: 97.8 F (36.6 C) 97.9 F (36.6 C) 97.9 F (36.6 C) 98.6 F (37 C)  TempSrc: Oral Oral Oral Oral  SpO2: 100% 100% 100% 100%  Weight:  113.6 kg (250 lb 8 oz)    Height:  6\' 3"  (1.905 m)       General: Well developed, well nourished, obese male. NAD. Head: Normocephalic, atraumatic, sclera non-icteric, mucus membranes are moist. Neck: Supple without lymphadenopathy/masses.  Lungs: Clear bilaterally to auscultation without wheezes, rales, or rhonchi. Breathing is unlabored. Heart: RRR with normal S1, S2. No murmurs, rubs, or gallops appreciated. Abdomen: Soft, tender only around umbilical hernia which does not appear to be  reducible. Musculoskeletal:  Strength and tone appear normal for age. Lower extremities: Hyperpigmented and woody bilateral edema. R foot bandaged. Neuro: Alert and oriented X 3. Moves all extremities spontaneously. Psych:  Responds to questions appropriately with a normal affect. Dialysis Access: PC in R chest. Maturing R AVF + thrill (2nd stage BVT 01/24/16)  Allergies  Allergen Reactions  . No Known Allergies    Prior to Admission medications   Medication Sig Start Date End Date Taking? Authorizing Provider  acetaminophen (TYLENOL) 325 MG tablet Take 650 mg by mouth every 6 (six) hours as needed (pain).   Yes Historical Provider, MD  amlodipine-benazepril (LOTREL) 2.5-10 MG capsule Take 1 capsule by mouth daily.   Yes Historical Provider, MD  diclofenac sodium (VOLTAREN) 1 % GEL Apply topically 4 (four) times daily.   Yes Historical Provider, MD  ergocalciferol (VITAMIN D2) 50000  units capsule Take 50,000 Units by mouth once a week.   Yes Historical Provider, MD  insulin glargine (LANTUS) 100 UNIT/ML injection Inject 0.3 mLs (30 Units total) into the skin at bedtime. 08/07/13  Yes Burnard Bunting, MD  insulin lispro (HUMALOG) 100 UNIT/ML injection Inject 10 Units into the skin 3 (three) times daily with meals.    Yes Historical Provider, MD  lidocaine-prilocaine (EMLA) cream Apply 1 application topically as needed (Apply small amount to access site 1-2 hours before dialysis. Cover with occlusive dressing (saran wrap)).    Yes Historical Provider, MD  multivitamin (RENA-VIT) TABS tablet Take 1 tablet by mouth at bedtime. 08/08/13  Yes Burnard Bunting, MD  oxyCODONE-acetaminophen (PERCOCET/ROXICET) 5-325 MG tablet Take 1 tablet by mouth every 6 (six) hours as needed for moderate pain. 01/24/16  Yes Alvia Grove, PA-C  polysaccharide iron (NIFEREX) 150 MG CAPS capsule Take 1 capsule (150 mg total) by mouth daily. 01/16/11  Yes Estela Leonie Green, MD  sevelamer carbonate (RENVELA) 800 MG  tablet Take 1,600 mg by mouth 3 (three) times daily with meals.   Yes Historical Provider, MD  warfarin (COUMADIN) 5 MG tablet Take 1.5 tablets (7.5 mg total) by mouth at bedtime. Patient taking differently: Take 7.5-10 mg by mouth at bedtime. Take 1.5 tablets on Tuesday,Wed, and Thurs, Saturday, and Sunday.  Take 2 tablets on Monday and Friday 01/25/16  Yes Alvia Grove, PA-C   Current Facility-Administered Medications  Medication Dose Route Frequency Provider Last Rate Last Dose  . acetaminophen (TYLENOL) tablet 650 mg  650 mg Oral Q6H PRN Kathie Dike, MD   650 mg at 03/09/16 1628   Or  . acetaminophen (TYLENOL) suppository 650 mg  650 mg Rectal Q6H PRN Kathie Dike, MD      . collagenase (SANTYL) ointment   Topical Daily Kathie Dike, MD      . insulin aspart (novoLOG) injection 0-5 Units  0-5 Units Subcutaneous QHS Kathie Dike, MD      . insulin aspart (novoLOG) injection 0-9 Units  0-9 Units Subcutaneous TID WC Kathie Dike, MD   1 Units at 03/06/16 1706  . iron polysaccharides (NIFEREX) capsule 150 mg  150 mg Oral Daily Kathie Dike, MD   150 mg at 03/12/16 1110  . multivitamin (RENA-VIT) tablet 1 tablet  1 tablet Oral QHS Kathie Dike, MD   1 tablet at 03/11/16 2123  . ondansetron (ZOFRAN) tablet 4 mg  4 mg Oral Q6H PRN Kathie Dike, MD   4 mg at 03/10/16 1940   Or  . ondansetron (ZOFRAN) injection 4 mg  4 mg Intravenous Q6H PRN Kathie Dike, MD   4 mg at 03/10/16 0307  . oxyCODONE (Oxy IR/ROXICODONE) immediate release tablet 5 mg  5 mg Oral Q6H PRN Kathie Dike, MD   5 mg at 03/11/16 2022  . piperacillin-tazobactam (ZOSYN) IVPB 3.375 g  3.375 g Intravenous Q12H Kathie Dike, MD   3.375 g at 03/12/16 0820  . sevelamer carbonate (RENVELA) tablet 800 mg  800 mg Oral TID WC Fran Lowes, MD   800 mg at 03/12/16 0820  . sodium chloride 0.9 % bolus 1,000 mL  1,000 mL Intravenous Once Kathie Dike, MD       And  . sodium chloride 0.9 % bolus 1,000 mL  1,000 mL  Intravenous Once Kathie Dike, MD       And  . sodium chloride 0.9 % bolus 1,000 mL  1,000 mL Intravenous Once Kathie Dike, MD      .  sodium chloride flush (NS) 0.9 % injection 3 mL  3 mL Intravenous Q12H Kathie Dike, MD   3 mL at 03/11/16 2124  . vancomycin (VANCOCIN) IVPB 1000 mg/200 mL premix  1,000 mg Intravenous Q M,W,F-HD Kathie Dike, MD   1,000 mg at 03/11/16 1220   Labs: Basic Metabolic Panel:  Recent Labs Lab 03/08/16 0156 03/08/16 0726 03/09/16 0658 03/10/16 0552 03/11/16 0539 03/12/16 0942  NA 132* 134* 133* 134* 133* 133*  K 3.9 4.1 4.3 3.9 4.3 4.5  CL 96* 97* 97* 97* 95* 96*  CO2 27 27 26 28 27 24   GLUCOSE 78 78 69 88 86 89  BUN 29* 32* 41* 27* 34* 19  CREATININE 6.45* 6.69* 7.67* 5.81* 6.94* 5.56*  CALCIUM 8.0* 8.3* 8.1* 7.8* 8.2* 8.2*  PHOS 3.3 3.3 3.5  --   --   --     Recent Labs Lab 03/06/16 0529 03/08/16 0156 03/08/16 0726 03/09/16 0658  AST 25  --   --   --   ALT 14*  --   --   --   ALKPHOS 136*  --   --   --   BILITOT 4.0*  --   --   --   PROT 6.3*  --   --   --   ALBUMIN 2.2* 2.1* 2.2* 2.1*     Recent Labs Lab 03/08/16 0726 03/09/16 0658 03/10/16 0552 03/11/16 0539 03/12/16 0942  WBC 10.8* 11.1* 12.6* 13.2* 10.3  HGB 10.9* 10.5* 10.5* 10.4* 11.0*  HCT 32.7* 30.7* 31.1* 31.0* 32.5*  MCV 82.8 81.4 82.3 80.7 80.2  PLT 255 245 251 318 315     Recent Labs Lab 03/11/16 0742 03/11/16 1151 03/11/16 2150 03/12/16 0031 03/12/16 0751  GLUCAP 99 99 112* 97 88   Studies/Results: Ct Angio Ao+bifem W &/or Wo Contrast  Result Date: 03/10/2016 CLINICAL DATA:  65 year old with right leg and foot swelling. History of melanoma, peripheral vascular disease and hemodialysis. History of non-Hodgkin's lymphoma. EXAM: CT ANGIOGRAPHY OF ABDOMINAL AORTA WITH ILIOFEMORAL RUNOFF TECHNIQUE: Multidetector CT imaging of the abdomen, pelvis and lower extremities was performed using the standard protocol during bolus administration of intravenous  contrast. Multiplanar CT image reconstructions and MIPs were obtained to evaluate the vascular anatomy. CONTRAST:  150 mL Isovue 370 COMPARISON:  PET-CT dated 03/22/2015 FINDINGS: VASCULAR Aorta: Normal caliber of the abdominal aorta without dissection. Mild atherosclerotic disease in the abdominal aorta. Celiac: Patent without evidence of aneurysm, dissection, vasculitis or significant stenosis. SMA: Patent without evidence of aneurysm, dissection, vasculitis or significant stenosis. Renals: Bilateral renal arteries are patent. No significant renal artery stenosis. IMA: Patent with calcified plaque. RIGHT Lower Extremity Inflow: Right iliac arteries have calcified plaque without significant stenosis. Outflow: Right common femoral artery is widely patent with mild plaque. Right profunda femoral arteries are patent. Diffuse atherosclerotic disease in the right SFA without critical stenosis. Diffuse atherosclerotic disease in the right popliteal artery without critical stenosis. Runoff: Runoff vessels are diffusely calcified. There is 3 vessel runoff in the right lower extremity with a short-segment occlusion in the mid anterior tibial artery with distal reconstitution. There is flow at the posterior tibial artery and dorsalis pedis artery at the ankle. LEFT Lower Extremity Inflow: Atherosclerotic calcifications in the left iliac arteries without significant stenosis. Outflow: Left common femoral artery is widely patent. Left profunda femoral arteries are patent. Diffuse atherosclerotic disease in the left SFA without critical stenosis. Mild narrowing in the mid left popliteal artery. Runoff: Runoff vessels are heavily  calcified. There appears to be three-vessel runoff in the left lower extremity. Appears to be flow at the dorsalis pedis artery and posterior tibial artery at the ankle. Veins: IVC filter positioned below the renal veins. Cannot evaluate for IVC patency on this arterial phase of imaging. Review of the  MIP images confirms the above findings. NON-VASCULAR Lower chest: Small pleural effusions, right side greater than left. Mild edema or atelectasis at both lung bases. Central venous catheter extending into the right atrium. Hepatobiliary: Limited evaluation on this arterial phase imaging. There is large gallstone measuring up the 3.3 cm. Difficult to evaluate for any gallbladder wall thickening or inflammation due to large amount of perihepatic ascites. Pancreas: Limited evaluation but no gross abnormality. Spleen: Normal appearance of spleen without enlargement. Adrenals/Urinary Tract: No gross abnormality to the left adrenal gland. Right adrenal gland is poorly visualized. No evidence for hydronephrosis. Urinary bladder is decompressed. Stomach/Bowel: No gross abnormality to the stomach, small or large bowel. No evidence for bowel obstruction. Lymphatic: Limited evaluation for abdominal or pelvic lymphadenopathy. There appears to be small lymph nodes in the left periaortic region. There are markedly enlarged lymph nodes in the inguinal regions, right side greater than left. These large inguinal lymph nodes may be reactive. Index right inguinal lymph node measures 2.4 cm in short axis on sequence 5, 184, previously measured 1.9 cm in the short axis. Reproductive: No gross abnormality to the prostate. Other: Subcutaneous edema in both lower extremities, right side greater than left. Soft tissue calcifications in the posterior thigh musculature bilaterally. Large amount of ascites throughout the abdomen and pelvis. The ascites has clearly progressed since 03/22/2015. Musculoskeletal: Cortical thickening in the proximal right fibula probably related to an old injury. There is a fracture involving the distal left fibula. Callus formation along the posterior aspect of this fracture suggesting a subacute injury or incomplete healing. Significant disc and endplate disease at N0-U7. IMPRESSION: VASCULAR Calcified  atherosclerotic disease throughout the aorta and lower extremities bilaterally. There is no significant inflow or outflow disease. Evaluation of the runoff vessels is limited due to the heavily calcified vessels but there is flow to both ankles. There is a short-segment occlusion of the right anterior tibial artery. IVC filter is present. NON-VASCULAR Large volume ascites. Findings could be related to underlying liver disease but the liver is poorly characterized on this examination. Cholelithiasis. Small pleural effusions with dependent pulmonary edema or atelectasis. Diffuse subcutaneous edema throughout the lower extremities, right side greater than left. Enlarged inguinal lymph nodes are probably reactive in etiology. Recommend monitoring this lymphadenopathy based on the history of lymphoma. Age-indeterminate fracture in the distal left fibula. This fracture has lucency with a small amount of callus formation. This could represent a subacute injury versus a nonunion. Electronically Signed   By: Markus Daft [name]   On: 03/10/2016 12:53   US Venous Img Lower Bilateral  Result Date: 03/11/2016 CLINICAL DATA:  Right lower extremity pain and edema. History of end-stage renal disease. Patient has an IVC filter. EXAM: BILATERAL LOWER EXTREMITY VENOUS DOPPLER ULTRASOUND TECHNIQUE: Gray-scale sonography with graded compression, as well as color Doppler and duplex ultrasound were performed to evaluate the lower extremity deep venous systems from the level of the common femoral vein and including the common femoral, femoral, profunda femoral, popliteal and calf veins including the posterior tibial, peroneal and gastrocnemius veins when visible. The superficial great saphenous vein was also interrogated. Spectral Doppler was utilized to evaluate flow at rest and with distal augmentation maneuvers  in the common femoral, femoral and popliteal veins. COMPARISON:  PET-CT 03/22/2015 FINDINGS: RIGHT LOWER EXTREMITY Common  Femoral Vein: No evidence of thrombus. Normal compressibility, respiratory phasicity and color Doppler flow. Saphenofemoral Junction: No evidence of thrombus. Normal compressibility and flow on color Doppler imaging. Profunda Femoral Vein: No evidence of thrombus. Normal compressibility and flow on color Doppler imaging. Femoral Vein: No evidence of thrombus. Normal compressibility, respiratory phasicity and response to augmentation. Popliteal Vein: No evidence of thrombus. Normal compressibility, respiratory phasicity and response to augmentation. Calf Veins: Not visualized due to body habitus and edema. Other Findings: Enlarged right inguinal lymph node measuring 1.9 cm in the short axis. This was present on the previous PET-CT. LEFT LOWER EXTREMITY Common Femoral Vein: No evidence of thrombus. Normal compressibility, respiratory phasicity and color Doppler flow. Saphenofemoral Junction: No evidence of thrombus. Normal compressibility and flow on color Doppler imaging. Profunda Femoral Vein: No evidence of thrombus. Normal compressibility and flow on color Doppler imaging. Femoral Vein: No evidence of thrombus. Normal compressibility, respiratory phasicity and response to augmentation. Popliteal Vein: No evidence of thrombus. Normal compressibility, respiratory phasicity and response to augmentation. Calf Veins: Not visualized. Other Findings: Prominent left inguinal lymph node measuring 0.9 cm in the short axis. IMPRESSION: No evidence of deep venous thrombosis. The deep calf veins could not be visualized due to the edema. Prominent bilateral inguinal lymph nodes are similar to the previous PET-CT. Electronically Signed   By: Markus Daft [name]   On: 03/11/2016 16:40    Dialysis Orders: Center: [place] on TTS 4 hours, 35 minutes, EDW 117.5kg (needs adjustment), 3K/2.5Ca, BFR 400, DFR 800, TDC - Heparin 12,000 units bolus q HD - Hectoral 2 mcg IV q HD - Mircera 258mcg IV q 2 weeks (last given 10/3) -  Venofer 50mg  IV q week  Assessment/Plan: 1.  R foot cellulitis/gangrene: Will be evaluated by his primary podiatrist. On Vanc/Zosyn. Likely will need surgical intervention. 2.  ESRD: TTS schedule at outpatient, was getting HD MWF while at [facility], last dialyzed 10/11. Next HD 10/13, but will plan to transition back to home TTS schedule soon.  3.  Hypertension/volume: BP reasonable, + LE edema. Weight today is well below his outpatient EDW, will need to be adjusted on discharge. 4.  Anemia: Hgb 11. No ESA for now. 5.  Metabolic bone disease: Ca 8.2, last Phos 3.5. Continue binder. Will add back VDRA. 6.  Nutrition: Albumin 2.1. Continue high protein diet. Says Nepro causes GI upset. 7. DM: Per primary. 8. Hx DVT on warfarin: Per primary. Prior notes indicate that may no longer need.  Veneta Penton, PA-C 03/12/2016, 12:19 PM  [facility] Pager: [phone]  I have seen and examined this patient and agree with plan and assessment in the above note with renal recommendations/intervention highlighted. Transferred from [facility] for evaluation by his primary podiatrist. Off usual HD schedule but will get back on MWF next week.  John Trawick B,MD 03/12/2016 1:18 PM

## 2016-03-12 NOTE — Care Management Important Message (Signed)
Important Message  Patient Details  Name: [name] MRN: [id number] Date of Birth: 1950-10-03   Medicare Important Message Given:  Yes    Nathen May 03/12/2016, 10:01 AM

## 2016-03-12 NOTE — Progress Notes (Signed)
New Admission Note:  Arrival Method: Ambulance stretcher  Mental Orientation: Alert and oriented x 4 Telemetry: N/A Assessment: Completed Skin: Warm, dry and flaky IV: NSL Pain: Denies  Tubes: N/A Safety Measures: Safety Fall Prevention Plan was given, discussed and signed. Admission: Completed [address] [place] Orientation: Patient has been orientated to the room, unit and the staff. Family: None  Orders have been reviewed and implemented. Will continue to monitor the patient. Call light has been placed within reach and bed alarm has been activated.   Sima Matas BSN, RN  Phone Number: [phone]

## 2016-03-12 NOTE — Progress Notes (Signed)
Hypoglycemic Event  CBG: Results for DRAYLON, MERCADEL (MRN [id number]) as of 03/12/2016 23:22  Ref. Range 03/12/2016 21:44  Glucose-Capillary Latest Ref Range: 65 - 99 mg/dL 69    Treatment: 15 GM carbohydrate snack  Symptoms: None  Follow-up CBG: Time: CBG Result Results for CHIRON, CAMPIONE (MRN [id number]) as of 03/12/2016 23:22  Ref. Range 03/12/2016 23:07  Glucose-Capillary Latest Ref Range: 65 - 99 mg/dL 95    Possible Reasons for Event: Inadequate meal intake  Comments/MD notified:    Viviano Simas

## 2016-03-12 NOTE — Progress Notes (Addendum)
[place] for [facility] Indication: VTE prophylaxis, h/o DVT, chronic Coumadin from home  Allergies  Allergen Reactions  . No Known Allergies    Patient Measurements: Height: 6\' 3"  (190.5 cm) Weight: 250 lb 8 oz (113.6 kg) IBW/kg (Calculated) : 84.5  Vital Signs: Temp: 98.6 F (37 C) (10/12 0923) Temp Source: Oral (10/12 0923) BP: 126/58 (10/12 0923) Pulse Rate: 96 (10/12 0923)  Labs:  Recent Labs  03/10/16 0552 03/11/16 0539 03/12/16 0942  HGB 10.5* 10.4* 11.0*  HCT 31.1* 31.0* 32.5*  PLT 251 318 315  LABPROT 39.2* 39.9* 34.9*  INR 3.90 3.99 3.37  CREATININE 5.81* 6.94* 5.56*   Estimated Creatinine Clearance: 18.7 mL/min (by C-G formula based on SCr of 5.56 mg/dL (H)).  Medical History: Past Medical History:  Diagnosis Date  . Anemia   . Arthritis    HNP- lumbar, "all over my body"  . Blood transfusion    "years ago; blood was low" (08/05/2013)  . CKD (chronic kidney disease) stage 4, GFR 15-29 ml/min (HCC) 03/18/2012   [place]- T,TH,Sat.  . Diabetic nephropathy ([place])   . Diabetic retinopathy   . DVT (deep venous thrombosis) ([place])    "got one in my right leg now; I've had one before too, not sure which leg" (08/05/2013)  . ESRD (end stage renal disease) on dialysis [facility])    "just started today, (08/04/2013)"  . Family history of anesthesia complication    " my son wakes up slowly"  . GERD (gastroesophageal reflux disease)    uses alka seltzere on occas.   [name])    "one q now and then" (08/05/2013)  . Hyperlipidemia   . Hypertension   . 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 Nodular lymphoma of intra-abdominal lymph nodes ([place])   . Non Hodgkin's lymphoma ([place])    Tx 2009; "had chemo; it went away" (08/05/2013)  . Noncompliance 03/16/2012  . NSVT (nonsustained ventricular tachycardia) ([place]) 03/18/2012  . Peripheral vascular disease ([place])   . Pneumonia 2013   hosp.-   .  Poor historian    pt. unsure of several answers to health history questions   . Skin cancer    melanoma - head  . Sleep apnea    "suppose to have a sleep study, but they never told me when. (08/05/2013)   Medications:  Prescriptions Prior to Admission  Medication Sig Dispense Refill Last Dose  . acetaminophen (TYLENOL) 325 MG tablet Take 650 mg by mouth every 6 (six) hours as needed (pain).   Past Week at Unknown time  . amlodipine-benazepril (LOTREL) 2.5-10 MG capsule Take 1 capsule by mouth daily.   03/04/2016 at Unknown time  . diclofenac sodium (VOLTAREN) 1 % GEL Apply topically 4 (four) times daily.   Past Week at Unknown time  . ergocalciferol (VITAMIN D2) 50000 units capsule Take 50,000 Units by mouth once a week.   Past Week at Unknown time  . insulin glargine (LANTUS) 100 UNIT/ML injection Inject 0.3 mLs (30 Units total) into the skin at bedtime. 10 mL 11 Past Week at Unknown time  . insulin lispro (HUMALOG) 100 UNIT/ML injection Inject 10 Units into the skin 3 (three) times daily with meals.    03/04/2016 at Unknown time  . lidocaine-prilocaine (EMLA) cream Apply 1 application topically as needed (Apply small amount to access site 1-2 hours before dialysis. Cover with occlusive dressing (saran wrap)).    Past Month  at Unknown time  . multivitamin (RENA-VIT) TABS tablet Take 1 tablet by mouth at bedtime. 30 tablet 0 Past Week at Unknown time  . oxyCODONE-acetaminophen (PERCOCET/ROXICET) 5-325 MG tablet Take 1 tablet by mouth every 6 (six) hours as needed for moderate pain. 30 tablet 0 03/04/2016 at Unknown time  . polysaccharide iron (NIFEREX) 150 MG CAPS capsule Take 1 capsule (150 mg total) by mouth daily. 30 each 2 Past Week at Unknown time  . sevelamer carbonate (RENVELA) 800 MG tablet Take 1,600 mg by mouth 3 (three) times daily with meals.   Past Week at Unknown time  . warfarin (COUMADIN) 5 MG tablet Take 1.5 tablets (7.5 mg total) by mouth at bedtime. (Patient taking differently:  Take 7.5-10 mg by mouth at bedtime. Take 1.5 tablets on Tuesday,Wed, and Thurs, Saturday, and Sunday.  Take 2 tablets on Monday and Friday) 30 tablet 1 03/04/2016 at Unknown time   Assessment: 65yo male on chronic Coumadin due to h/o DVT of lower extremity.  Pt's home dose listed above but reportedly dose has changed and pt not always compliant with ordered dose.  Also h/o s/p IVC filter placement (May 2014). INR was 3.90, SUPRAtherpeutic on admit date 10/10, remained high at 3.99 on 10/11 and today the INR is 3.37.  No coumadin given as inpatient since admission. Pharmacy consultedon 10/11 for IV heparin when INR <2 as coumadin is on hold for possible need for surgery. IV heparin not started yet since INR is SUPRAtherapeutic.   General surgery recommends deferring to patient's primary podiatrist for possible surgery and amputation.   CBC appears stable with Hgb low/stable and pltc within normal.  No bleeding reported.    Dr. Wyline Copas noted that the patient's  IVC filter was placed in May 2014 as anticoagulation needed to be discontinued at that time for a planned lumbar surgery. His coumadin has since been continued to this day. Patient denies family history of clots and patient also denies more than 1 thromboembolic episode in his lifetime. MD questions patient's need for further Coumadin therapy.  Dr Wyline Copas noted yesterday plan to check BLE duplex and that if no evidence of DVT, will consider discontinuation of further anticoagulation.   Bilateral LE venous doppler U/S on 03/11/16: No evidence of deep venous thrombosis. The deep calf veins could not be visualized due to the edema.   Goal of Therapy:  INR 2-3 Monitor platelets by anticoagulation protocol: Yes   Plan:  I will follow up today with attending, Dr. Doyle Askew today as to plan for anticoagulation as noted above since BLE venous dopplers are negative.  INR daily for now,  monitor CBC Monitor for s/sx of bleeding complications.    Nicole Cella,  RPh Clinical Pharmacist Pager: [phone] 03/12/2016,11:21 AM   Addendum:  No heparin infusion needed yet since INR is supratherapeutic. Daily INR.  Start IV heparin infusion if INR <2 unless anticoagulation therapy is discontinued.  Nicole Cella, RPh Clinical Pharmacist Pager: [phone] 03/12/2016 3:35 PM

## 2016-03-12 NOTE — Consult Note (Addendum)
Indication for Consultation: management of cellulitis, gangrene right foot.   HPI:  65 year old male with a history of diabetes mellitus presents at [name] inpatient. Patient was recently transferred from [name]. Podiatry consulted for evaluation of right foot cellulitis and gangrene. Primary podiatrist is Dr. Ila Mcgill. Patient states that he noticed bloody drainage in his socks and shoes prior to admission to the hospital. Patient states she has a loss of appetite.   Active Ambulatory Problems    Diagnosis Date Noted  . LYMPHOMA 10/24/2008  . Diabetes mellitus ([name]) 10/24/2008  . ABDOMINAL PAIN -GENERALIZED 10/24/2008  . PERSONAL HX COLONIC POLYPS 10/24/2008  . Renal failure, unspecified 01/12/2011  . Hypokalemia 01/12/2011  . Anemia in chronic kidney disease 01/12/2011  . Shortness of breath dyspnea 01/12/2011  . Warfarin-induced coagulopathy ([place]) 01/12/2011  . History of DVT of lower extremity 01/12/2011  . Hypertension 01/12/2011  . Acidosis, metabolic 44/06/270  . PNA (pneumonia) 01/15/2011  . History of cardiac catheterization   . Non Hodgkin's lymphoma ([name])   . Hyperlipidemia   . Diabetic retinopathy ([name])   . Diabetic nephropathy ([name])   . DVT (deep venous thrombosis) ([place])   . Cancer ([place])   . Proliferative diabetic retinopathy associated with type 2 diabetes mellitus ([place]) 08/18/2011  . 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 Chest pain, no MI, negative myoview, most likely muscular sketal pain 03/15/2012  . Noncompliance 03/16/2012  . CKD (chronic kidney disease) stage 4, GFR 15-29 ml/min (HCC) 03/18/2012  . NSVT (nonsustained ventricular tachycardia) ([name]) 03/18/2012  . 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 Vitreous hemorrhage ([place]) 04/19/2012  . Chest pain with low risk for cardiac etiology 12/27/2012  . Chronic anticoagulation 12/27/2012  . ESRD (end stage renal disease) ([place]) 01/31/2013  .  Chronic kidney disease (CKD), stage IV (severe) ([place]) 03/21/2013  . Hyperglycemia 08/04/2013  . Uremia 08/04/2013  . History of colonic polyps   . Benign neoplasm of transverse colon   . Skin cancer of scalp 11/22/2015   Resolved Ambulatory Problems    Diagnosis Date Noted  . No Resolved Ambulatory Problems   Past Medical History:  Diagnosis Date  . Anemia   . Arthritis   . Blood transfusion   . CKD (chronic kidney disease) stage 4, GFR 15-29 ml/min (HCC) 03/18/2012  . Diabetic nephropathy ([name])   . Diabetic retinopathy   . DVT (deep venous thrombosis) ([name])   . ESRD (end stage renal disease) on dialysis ([name])   . Family history of anesthesia complication   . GERD (gastroesophageal reflux disease)   . Headache(784.0)   . Hyperlipidemia   . Hypertension   . IDDM (insulin dependent diabetes mellitus) ([place])   . Nodular lymphoma of intra-abdominal lymph nodes ([name])   . Non Hodgkin's lymphoma ([name])   . Noncompliance 03/16/2012  . NSVT (nonsustained ventricular tachycardia) ([place]) 03/18/2012  . Peripheral vascular disease ([name])   . Pneumonia 2013  . Poor historian   . Skin cancer   . Sleep apnea      Lower Extremity/Physical Exam General: The patient is alert and oriented x3 in no acute distress. Well nourished.  Dermatology: Ischemic gangrene, wet, noted to digits 2 through 5 of the right foot with purulent draining ulcerations. Ulceration also noted to the plantar medial aspect of the right great toe measuring approximately 4cm x 3cm x 0.3cm (L x W x D). [place] noted consistent with acute gangrenous infection.  Vascular: Pedal pulses diminished  bilaterally. CT angiogram performed on 03/10/2016. Please see final report. Skin is warm to touch with erythema and edema diffusely extending to the ankle joint.   Neurological: Epicritic and protective threshold absent bilaterally.   Musculoskeletal Exam: Ischemic, wet gangrenous changes noted digits 2 through 5 right foot. MRI and  radiographic exam was performed of the right foot on 03/05/2016. Please see report.  Assessment: #1 diabetes mellitus  #2 ischemic, wet gangrene right foot #3 cellulitis right foot  Problem List Items Addressed This Visit    DVT (deep venous thrombosis) (HCC)   Relevant Medications   amlodipine-benazepril (LOTREL) 2.5-10 MG capsule   warfarin (COUMADIN) tablet 7.5 mg (Completed)   warfarin (COUMADIN) tablet 7.5 mg (Completed)   warfarin (COUMADIN) tablet 5 mg (Completed)   Other Relevant Orders   US Venous Img Lower Bilateral (Completed)   ESRD (end stage renal disease) (HCC)   Severe sepsis (HCC) - Primary   Relevant Medications   vancomycin (VANCOCIN) 2,000 mg in sodium chloride 0.9 % 500 mL IVPB (Completed)   vancomycin (VANCOCIN) IVPB 1000 mg/200 mL premix   vancomycin (VANCOCIN) IVPB 1000 mg/200 mL premix (Completed)   Sepsis (HCC)   Relevant Medications   vancomycin (VANCOCIN) 2,000 mg in sodium chloride 0.9 % 500 mL IVPB (Completed)   vancomycin (VANCOCIN) IVPB 1000 mg/200 mL premix   vancomycin (VANCOCIN) IVPB 1000 mg/200 mL premix (Completed)   Other Relevant Orders   MR Foot Right Wo Contrast (Completed)   Cellulitis of right lower extremity    Other Visit Diagnoses    Cellulitis of toe of right foot       PICC (peripherally inserted central catheter) in place       Relevant Orders   DG Chest Port 1V same Day (Completed)   Cellulitis       Relevant Orders   US Arterial Seg Single (Completed)   CT ANGIO AO+BIFEM W &/OR WO CONTRAST (Completed)   Open wound of right foot, subsequent encounter          Plan of Care:  #1 Patient was evaluated this evening at bedside. #2 discussed condition of patient with Dr. Doyle Askew, attending physician.  #3 Patient will require surgical intervention due to the acute gangrenous changes. Surgery will consist of transmetatarsal amputation with tendo Achilles lengthening right foot. Discussed in detail the serious nature of the  patient's condition and medically necessary for foot amputation. Patient consented for surgical intervention. Discussed in detail all possible consultations and details of the procedure. #4 preoperative orders placed and patient added to the surgical schedule for after 5 PM, 03/13/2016. #5 continue IV antibiotic regimen. #6 podiatry will continue to follow, inpatient and post discharge.   Dr. Edrick Kins, [place]

## 2016-03-12 NOTE — Progress Notes (Addendum)
PROGRESS NOTE    [name]  QJF:[id number] DOB: [birth year] DOA: 03/05/2016   PCP: Geoffery Lyons, MD   Brief Narrative:  Please see dictated H&P dated 03/05/2016 for details. 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 Patient had been followed closely by Dr. Paulla Dolly of [place] for chronic right foot wound.  Thus far during this hospital course, patient was continued on broad-spectrum antibiotics (vancomycin and Zosyn). MRI was obtained with no evidence of deeper infection including abscess or osteomyelitis. Patient's wounds continue to drain and general surgery was consulted. CT of the right leg was obtained and case was discussed with vascular surgery who felt patient was appropriate for amputation if indicated. Discussed case with general surgery who recommended discussing with patient's primary podiatrist. Discussed case with podiatry who has since recommended transfer to [facility] for formal evaluation and possible surgery. Transfer orders has since been placed.  Assessment & Plan:  1. Right foot cellulitis and gangrene with sepsis present on admission 1. Patient remains on vancomycin and Zosyn. We'll continue broad-spectrum antibiotics for now 2. Lactic acid noted to be within normal range 3. Dr. Wyline Copas discussed case with general surgery who recommended deferring to patient's primary podiatrist for possible surgery and amputation. 4. Case was also discussed with patient's primary podiatrist, who recommends transfer to [facility] for formal podiatry evaluation will be done, Dr Amalia Hailey called  and wants to take to OR in AM 2. End-stage renal disease 1. Patient normally dialyzes on Tuesdays Thursdays and Saturdays prior to hospital admission 2. Dialysis was held on 03/05/2016 secondary to presenting hypotension 3. Patient has since continued on a Monday Wednesday Friday schedule since admission 4. Appreciate nephrology team input  3. Hypertension, essential  1. Blood pressure currently stable, albeit soft 2. We'll continue to monitor at this time 4. Prior history of DVT 1. Chart reviewed 2. Patient has a history of lower extremity DVT and had been on Coumadin 3. Per records, IVC filter was placed in May 2014 as anticoagulation needed to be discontinued for planned lumbar surgery 4. Patient's Coumadin has since been continued to this day.  5. Patient denies family history of clots. Patient also denies more than 1 thromboembolic episode in his lifetime 6. Question patient's need for further Coumadin therapy. Discussed this with vascular surgery who recommends lower extremity Dopplers.  7. No evidence of DVT on doppler, discontinue therapeutic AC  5. Chronic anticoagulation 1. Per above, patient had previously remained on therapeutic Coumadin for the past several years 2. Per above, follow-up on Doppler studies to determine if patient requires continued anticoagulation 3. For now, patient remains on therapeutic heparin 6. Diabetes mellitus with complications of nephropathy  1. We'll continue supplemental sliding scale insulin 7. Anemia, suspected secondary to chronic renal disease 1. Follow CBC 2. Transfuse as needed  DVT prophylaxis: Therapeutic heparin Code Status: Full code Family Communication: Patient in room, family not at bedside Disposition Plan: Not ready for discharge yet, plan for amputation in AM, NPO after midnight  Consultants:   Vascular surgery  General surgery  Podiatry - Dr. Paulla Dolly  Nephrology  Procedures:   None  Antimicrobials: Anti-infectives     Start  Dose/Rate Route Frequency Ordered Stop   03/07/16 1300  vancomycin (VANCOCIN) IVPB 1000 mg/200 mL premix     1,000 mg 200 mL/hr over 60 Minutes Intravenous  Once 03/07/16 1145 03/07/16 1417   03/06/16 1200  vancomycin (VANCOCIN) IVPB 1000 mg/200 mL premix     1,000 mg 200 mL/hr over 60 Minutes Intravenous Every M-W-F (Hemodialysis) 03/06/16 1052     03/05/16 2000  piperacillin-tazobactam (ZOSYN) IVPB 3.375 g     3.375 g 12.5 mL/hr over 240 Minutes Intravenous Every 12 hours 03/05/16 1015     03/05/16 1115  piperacillin-tazobactam (ZOSYN) IVPB 3.375 g  Status:  Discontinued     3.375 g 100 mL/hr over 30 Minutes Intravenous  Once 03/05/16 1109 03/05/16 1111   03/05/16 1115  vancomycin (VANCOCIN) IVPB 1000 mg/200 mL premix  Status:  Discontinued     1,000 mg 200 mL/hr over 60 Minutes Intravenous  Once 03/05/16 1109 03/05/16 1112   03/05/16 0745  piperacillin-tazobactam (ZOSYN) IVPB 3.375 g     3.375 g 100 mL/hr over 30 Minutes Intravenous  Once 03/05/16 0730 03/05/16 0811   03/05/16 0745  vancomycin (VANCOCIN) 2,000 mg in sodium chloride 0.9 % 500 mL IVPB     2,000 mg 250 mL/hr over 120 Minutes Intravenous  Once 03/05/16 0732 03/05/16 1053      Subjective: No complaints at this time  Objective: Vitals:   03/11/16 2158 03/12/16 0022 03/12/16 0438 03/12/16 0923  BP: (!) 105/43 (!) 103/33 (!) 103/32 (!) 126/58  Pulse: 100 (!) 107 (!) 104 96  Resp: 16 17 16 18   Temp: 97.8 F (36.6 C) 97.9 F (36.6 C) 97.9 F (36.6 C) 98.6 F (37 C)  TempSrc: Oral Oral Oral Oral  SpO2: 100% 100% 100% 100%  Weight:  113.6 kg (250 lb 8 oz)    Height:  6\' 3"  (1.905 m)      Intake/Output Summary (Last 24 hours) at 03/12/16 1733 Last data filed at 03/12/16 1400  Gross per 24 hour  Intake              360 ml  Output             2500 ml  Net            -2140 ml   Filed Weights   03/11/16 0551 03/11/16 1625 03/12/16 0022  Weight: 113.9 kg (251 lb 1.6 oz) 114.2 kg (251 lb 12.3 oz)  113.6 kg (250 lb 8 oz)    Examination:  General exam: Appears calm and comfortable  Respiratory system: Clear to auscultation. Respiratory effort normal. Cardiovascular system: S1 & S2 heard, RRR. Gastrointestinal system: Abdomen is nondistended, soft and nontender. No organomegaly or masses felt. Normal bowel sounds heard. Central nervous system: Alert and oriented. No focal neurological deficits. Extremities: Symmetric 5 x 5 power, edematous lower extremities bilaterally right greater than left. Skin: No rashes, dressings over right ankle. Dressings appear dry and intact Psychiatry: Judgement and insight appear normal. Mood & affect appropriate.   Data Reviewed: I have personally reviewed following labs and imaging studies  CBC:  Recent Labs Lab 03/08/16 0726 03/09/16 0658 03/10/16 0552 03/11/16 0539 03/12/16 0942  WBC 10.8* 11.1* 12.6* 13.2* 10.3  HGB 10.9* 10.5* 10.5* 10.4* 11.0*  HCT 32.7* 30.7* 31.1* 31.0* 32.5*  MCV 82.8 81.4 82.3 80.7 80.2  PLT 255 245 251 318 027   Basic Metabolic Panel:  Recent Labs Lab 03/08/16 0156 03/08/16 0726 03/09/16 7412 03/10/16 8786 03/11/16 0539 03/12/16 7672  NA 132* 134* 133* 134* 133* 133*  K 3.9 4.1 4.3 3.9 4.3 4.5  CL 96* 97* 97* 97* 95* 96*  CO2 27 27 26 28 27 24   GLUCOSE 78 78 69 88 86 89  BUN 29* 32* 41* 27* 34* 19  CREATININE 6.45* 6.69* 7.67* 5.81* 6.94* 5.56*  CALCIUM 8.0* 8.3* 8.1* 7.8* 8.2* 8.2*  PHOS 3.3 3.3 3.5  --   --   --    GFR: Estimated Creatinine Clearance: 18.7 mL/min (by C-G formula based on SCr of 5.56 mg/dL (H)). Liver Function Tests:  Recent Labs Lab 03/06/16 0529 03/08/16 0156 03/08/16 0726 03/09/16 0658  AST 25  --   --   --   ALT 14*  --   --   --   ALKPHOS 136*  --   --   --   BILITOT 4.0*  --   --   --   PROT 6.3*  --   --   --   ALBUMIN 2.2* 2.1* 2.2* 2.1*   Coagulation Profile:  Recent Labs Lab 03/08/16 0726 03/09/16 0658 03/10/16 0552 03/11/16 0539 03/12/16 0942  INR  2.60 3.50 3.90 3.99 3.37   CBG:  Recent Labs Lab 03/11/16 2150 03/12/16 0031 03/12/16 0751 03/12/16 1214 03/12/16 1726  GLUCAP 112* 97 88 82 188*   Recent Results (from the past 240 hour(s))  Blood culture (routine x 2)     Status: None   Collection Time: 03/05/16  7:28 AM  Result Value Ref Range Status   Specimen Description BLOOD LEFT ARM  Final   Special Requests BOTTLES DRAWN AEROBIC ONLY 5CC  Final   Culture NO GROWTH 5 DAYS  Final   Report Status 03/10/2016 FINAL  Final  Blood culture (routine x 2)     Status: None   Collection Time: 03/05/16  7:28 AM  Result Value Ref Range Status   Specimen Description BLOOD LEFT HAND  Final   Special Requests   Final    BOTTLES DRAWN AEROBIC AND ANAEROBIC AEB=5CC ANA=3CC   Culture NO GROWTH 5 DAYS  Final   Report Status 03/10/2016 FINAL  Final  MRSA PCR Screening     Status: None   Collection Time: 03/05/16 10:47 AM  Result Value Ref Range Status   MRSA by PCR NEGATIVE NEGATIVE Final    Comment:        The GeneXpert MRSA Assay (FDA approved for NASAL specimens only), is one component of a comprehensive MRSA colonization surveillance program. It is not intended to diagnose MRSA infection nor to guide or monitor treatment for MRSA infections.      Radiology Studies: US Venous Img Lower Bilateral  Result Date: 03/11/2016 CLINICAL DATA:  Right lower extremity pain and edema. History of end-stage renal disease. Patient has an IVC filter. EXAM: BILATERAL LOWER EXTREMITY VENOUS DOPPLER ULTRASOUND TECHNIQUE: Gray-scale sonography with graded compression, as well as color Doppler and duplex ultrasound were performed to evaluate the lower extremity deep venous systems from the level of the common femoral vein and including the common femoral, femoral, profunda femoral, popliteal and calf veins including the posterior tibial, peroneal and gastrocnemius veins when visible. The superficial great saphenous vein was also interrogated.  Spectral Doppler was utilized to evaluate flow at rest and with distal augmentation maneuvers in the common femoral, femoral and popliteal veins. COMPARISON:  PET-CT 03/22/2015 FINDINGS: RIGHT LOWER EXTREMITY Common Femoral Vein: No evidence of thrombus. Normal compressibility, respiratory phasicity and color Doppler flow. Saphenofemoral Junction: No evidence  of thrombus. Normal compressibility and flow on color Doppler imaging. Profunda Femoral Vein: No evidence of thrombus. Normal compressibility and flow on color Doppler imaging. Femoral Vein: No evidence of thrombus. Normal compressibility, respiratory phasicity and response to augmentation. Popliteal Vein: No evidence of thrombus. Normal compressibility, respiratory phasicity and response to augmentation. Calf Veins: Not visualized due to body habitus and edema. Other Findings: Enlarged right inguinal lymph node measuring 1.9 cm in the short axis. This was present on the previous PET-CT. LEFT LOWER EXTREMITY Common Femoral Vein: No evidence of thrombus. Normal compressibility, respiratory phasicity and color Doppler flow. Saphenofemoral Junction: No evidence of thrombus. Normal compressibility and flow on color Doppler imaging. Profunda Femoral Vein: No evidence of thrombus. Normal compressibility and flow on color Doppler imaging. Femoral Vein: No evidence of thrombus. Normal compressibility, respiratory phasicity and response to augmentation. Popliteal Vein: No evidence of thrombus. Normal compressibility, respiratory phasicity and response to augmentation. Calf Veins: Not visualized. Other Findings: Prominent left inguinal lymph node measuring 0.9 cm in the short axis. IMPRESSION: No evidence of deep venous thrombosis. The deep calf veins could not be visualized due to the edema. Prominent bilateral inguinal lymph nodes are similar to the previous PET-CT. Electronically Signed   By: Markus Daft [name]   On: 03/11/2016 16:40    Scheduled Meds: . collagenase    Topical Daily  . [START ON 03/13/2016] doxercalciferol  2 mcg Intravenous Q M,W,F-HD  . insulin aspart  0-5 Units Subcutaneous QHS  . insulin aspart  0-9 Units Subcutaneous TID WC  . iron polysaccharides  150 mg Oral Daily  . multivitamin  1 tablet Oral QHS  . piperacillin-tazobactam (ZOSYN)  IV  3.375 g Intravenous Q12H  . sevelamer carbonate  800 mg Oral TID WC  . sodium chloride  1,000 mL Intravenous Once   And  . sodium chloride  1,000 mL Intravenous Once   And  . sodium chloride  1,000 mL Intravenous Once  . sodium chloride flush  3 mL Intravenous Q12H  . vancomycin  1,000 mg Intravenous Q M,W,F-HD   Continuous Infusions:    LOS: 7 days   Faye Ramsay, MD [facility] Pager [phone]  If 7PM-7AM, please contact night-coverage [email or web address] Password [facility] 03/12/2016, 5:33 PM

## 2016-03-12 NOTE — Progress Notes (Signed)
Dopplers negative for DVT but please note since pt was on Coumadin and INR today is 3.37. Plan for pt to go to OR in AM for toes amputation. I will give once dose of Vit K IV now and have INR repeated in AM. Keep NPO after midnight, just in case.   Faye Ramsay, MD  [facility] Pager [phone]  If 7PM-7AM, please contact night-coverage [email or web address] Password [email or web address]

## 2016-03-13 ENCOUNTER — Inpatient Hospital Stay (HOSPITAL_COMMUNITY): Payer: Medicare Other

## 2016-03-13 ENCOUNTER — Inpatient Hospital Stay (HOSPITAL_COMMUNITY): Payer: Medicare Other | Admitting: Certified Registered Nurse Anesthetist

## 2016-03-13 ENCOUNTER — Encounter (HOSPITAL_COMMUNITY)
Admission: EM | Disposition: A | Discharge status: Skilled Nursing Facility | Payer: Self-pay | Source: Home / Self Care | Attending: Internal Medicine

## 2016-03-13 DIAGNOSIS — S98131A 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, initial encounter: Secondary | ICD-10-CM | POA: Insufficient documentation

## 2016-03-13 DIAGNOSIS — E119 Type 2 diabetes mellitus without complications: Secondary | ICD-10-CM

## 2016-03-13 DIAGNOSIS — M216X1 Other acquired deformities of right foot: Secondary | ICD-10-CM

## 2016-03-13 DIAGNOSIS — I96 Gangrene, not elsewhere classified: Secondary | ICD-10-CM

## 2016-03-13 DIAGNOSIS — L03115 Cellulitis of right lower limb: Secondary | ICD-10-CM

## 2016-03-13 DIAGNOSIS — M7731 Calcaneal spur, right foot: Secondary | ICD-10-CM | POA: Insufficient documentation

## 2016-03-13 DIAGNOSIS — L97509 Non-pressure chronic ulcer of other part of unspecified foot with unspecified severity: Secondary | ICD-10-CM

## 2016-03-13 HISTORY — PX: TRANSMETATARSAL AMPUTATION: SHX6197

## 2016-03-13 HISTORY — PX: ACHILLES TENDON SURGERY: SHX542

## 2016-03-13 LAB — POCT I-STAT 4, (NA,K, GLUC, HGB,HCT)
Glucose, Bld: 71 mg/dL (ref 65–99)
HCT: 36 % — ABNORMAL LOW (ref 39.0–52.0)
Hemoglobin: 12.2 g/dL — ABNORMAL LOW (ref 13.0–17.0)
Potassium: 4.1 mmol/L (ref 3.5–5.1)
Sodium: 135 mmol/L (ref 135–145)

## 2016-03-13 LAB — CBC
HCT: 30.9 % — ABNORMAL LOW (ref 39.0–52.0)
Hemoglobin: 10.6 g/dL — ABNORMAL LOW (ref 13.0–17.0)
MCH: 26.9 pg (ref 26.0–34.0)
MCHC: 34.3 g/dL (ref 30.0–36.0)
MCV: 78.4 fL (ref 78.0–100.0)
Platelets: 358 10*3/uL (ref 150–400)
RBC: 3.94 MIL/uL — ABNORMAL LOW (ref 4.22–5.81)
RDW: 16.8 % — ABNORMAL HIGH (ref 11.5–15.5)
WBC: 9.8 10*3/uL (ref 4.0–10.5)

## 2016-03-13 LAB — RENAL FUNCTION PANEL
Albumin: 1.9 g/dL — ABNORMAL LOW (ref 3.5–5.0)
Anion gap: 12 (ref 5–15)
BUN: 23 mg/dL — ABNORMAL HIGH (ref 6–20)
CO2: 27 mmol/L (ref 22–32)
Calcium: 8.4 mg/dL — ABNORMAL LOW (ref 8.9–10.3)
Chloride: 94 mmol/L — ABNORMAL LOW (ref 101–111)
Creatinine, Ser: 6.53 mg/dL — ABNORMAL HIGH (ref 0.61–1.24)
GFR calc Af Amer: 9 mL/min — ABNORMAL LOW (ref >60)
GFR calc non Af Amer: 8 mL/min — ABNORMAL LOW (ref >60)
Glucose, Bld: 81 mg/dL (ref 65–99)
Phosphorus: 3.9 mg/dL (ref 2.5–4.6)
Potassium: 4.4 mmol/L (ref 3.5–5.1)
Sodium: 133 mmol/L — ABNORMAL LOW (ref 135–145)

## 2016-03-13 LAB — VANCOMYCIN, RANDOM: Vancomycin Rm: 9

## 2016-03-13 LAB — PROTIME-INR
INR: 1.86
Prothrombin Time: 21.7 seconds — ABNORMAL HIGH (ref 11.4–15.2)

## 2016-03-13 LAB — GLUCOSE, CAPILLARY
Glucose-Capillary: 71 mg/dL (ref 65–99)
Glucose-Capillary: 71 mg/dL (ref 65–99)
Glucose-Capillary: 78 mg/dL (ref 65–99)
Glucose-Capillary: 84 mg/dL (ref 65–99)
Glucose-Capillary: 82 mg/dL (ref 65–99)

## 2016-03-13 SURGERY — AMPUTATION, FOOT, TRANSMETATARSAL
Anesthesia: General | Site: Leg Lower | Laterality: Right

## 2016-03-13 MED ORDER — ONDANSETRON HCL 4 MG/2ML IJ SOLN: 4.0000 mg | Freq: Once | INTRAMUSCULAR | Status: DC | PRN

## 2016-03-13 MED ORDER — SODIUM CHLORIDE 0.9 % IV SOLN: 100.0000 mL | INTRAVENOUS | Status: DC | PRN

## 2016-03-13 MED ORDER — 0.9 % SODIUM CHLORIDE (POUR BTL) OPTIME
TOPICAL | Status: DC | PRN
  Administered 2016-03-13: 1000 mL

## 2016-03-13 MED ORDER — SODIUM CHLORIDE 0.9 % IR SOLN
Status: DC | PRN
  Administered 2016-03-13: 3000 mL

## 2016-03-13 MED ORDER — FENTANYL CITRATE (PF) 100 MCG/2ML IJ SOLN
INTRAMUSCULAR | Status: AC
  Filled 2016-03-13: qty 2

## 2016-03-13 MED ORDER — BUPIVACAINE HCL (PF) 0.25 % IJ SOLN
INTRAMUSCULAR | Status: AC
  Filled 2016-03-13: qty 30

## 2016-03-13 MED ORDER — DEXAMETHASONE SODIUM PHOSPHATE 10 MG/ML IJ SOLN
INTRAMUSCULAR | Status: AC
  Filled 2016-03-13: qty 1

## 2016-03-13 MED ORDER — BUPIVACAINE HCL (PF) 0.5 % IJ SOLN
INTRAMUSCULAR | Status: AC
  Filled 2016-03-13: qty 30

## 2016-03-13 MED ORDER — LIDOCAINE HCL (PF) 1 % IJ SOLN: 5.0000 mL | INTRAMUSCULAR | Status: DC | PRN

## 2016-03-13 MED ORDER — OXYCODONE HCL 5 MG PO TABS: 5.0000 mg | ORAL_TABLET | Freq: Once | ORAL | Status: DC | PRN

## 2016-03-13 MED ORDER — BUPIVACAINE HCL (PF) 0.5 % IJ SOLN
INTRAMUSCULAR | Status: DC | PRN
  Administered 2016-03-13: 10 mL

## 2016-03-13 MED ORDER — DEXTROSE 50 % IV SOLN
INTRAVENOUS | Status: AC
  Administered 2016-03-13: 25 mL via INTRAVENOUS
  Filled 2016-03-13: qty 50

## 2016-03-13 MED ORDER — HEPARIN SODIUM (PORCINE) 1000 UNIT/ML DIALYSIS: 20.0000 [IU]/kg | INTRAMUSCULAR | Status: DC | PRN

## 2016-03-13 MED ORDER — VANCOMYCIN HCL 10 G IV SOLR: 1250.0000 mg | INTRAVENOUS | Status: DC

## 2016-03-13 MED ORDER — HYDROMORPHONE HCL 1 MG/ML IJ SOLN
0.2500 mg | INTRAMUSCULAR | Status: DC | PRN
  Administered 2016-03-14: 0.5 mg via INTRAVENOUS
  Filled 2016-03-13: qty 1

## 2016-03-13 MED ORDER — PHENYLEPHRINE HCL 10 MG/ML IJ SOLN
INTRAVENOUS | Status: DC | PRN
  Administered 2016-03-13: 30 ug/min via INTRAVENOUS

## 2016-03-13 MED ORDER — LIDOCAINE HCL 2 % IJ SOLN
INTRAMUSCULAR | Status: DC | PRN
  Administered 2016-03-13: 10 mL

## 2016-03-13 MED ORDER — SODIUM CHLORIDE 0.9 % IV SOLN: 1250.0000 mg | INTRAVENOUS | Status: DC

## 2016-03-13 MED ORDER — PROPOFOL 10 MG/ML IV BOLUS
INTRAVENOUS | Status: DC | PRN
  Administered 2016-03-13: 100 mg via INTRAVENOUS

## 2016-03-13 MED ORDER — DEXTROSE 50 % IV SOLN
INTRAVENOUS | Status: DC | PRN
  Administered 2016-03-13: 12.5 g via INTRAVENOUS

## 2016-03-13 MED ORDER — SODIUM CHLORIDE 0.9 % IV SOLN
INTRAVENOUS | Status: DC | PRN
Start: 2016-03-13 — End: 2016-03-13
  Administered 2016-03-13 (×2): via INTRAVENOUS

## 2016-03-13 MED ORDER — POVIDONE-IODINE 10 % EX SWAB
2.0000 "application " | Freq: Once | CUTANEOUS | Status: AC
  Administered 2016-03-13: 2 via TOPICAL

## 2016-03-13 MED ORDER — VANCOMYCIN HCL 500 MG IV SOLR
500.0000 mg | INTRAVENOUS | Status: DC
  Filled 2016-03-13: qty 500

## 2016-03-13 MED ORDER — PROMETHAZINE HCL 25 MG/ML IJ SOLN: 6.2500 mg | INTRAMUSCULAR | Status: DC | PRN

## 2016-03-13 MED ORDER — ALTEPLASE 2 MG IJ SOLR: 2.0000 mg | Freq: Once | INTRAMUSCULAR | Status: DC | PRN

## 2016-03-13 MED ORDER — VANCOMYCIN HCL IN DEXTROSE 1-5 GM/200ML-% IV SOLN
INTRAVENOUS | Status: AC
  Filled 2016-03-13: qty 200

## 2016-03-13 MED ORDER — PHENYLEPHRINE HCL 10 MG/ML IJ SOLN
INTRAMUSCULAR | Status: DC | PRN
  Administered 2016-03-13: 80 ug via INTRAVENOUS
  Administered 2016-03-13: 120 ug via INTRAVENOUS
  Administered 2016-03-13: 80 ug via INTRAVENOUS
  Administered 2016-03-13: 120 ug via INTRAVENOUS
  Administered 2016-03-13: 80 ug via INTRAVENOUS

## 2016-03-13 MED ORDER — VANCOMYCIN HCL IN DEXTROSE 750-5 MG/150ML-% IV SOLN
750.0000 mg | INTRAVENOUS | Status: AC
  Administered 2016-03-14: 750 mg via INTRAVENOUS
  Filled 2016-03-13: qty 150

## 2016-03-13 MED ORDER — LIDOCAINE 2% (20 MG/ML) 5 ML SYRINGE
INTRAMUSCULAR | Status: DC | PRN
  Administered 2016-03-13: 100 mg via INTRAVENOUS

## 2016-03-13 MED ORDER — DEXTROSE 50 % IV SOLN
25.0000 mL | Freq: Once | INTRAVENOUS | Status: AC
  Administered 2016-03-13: 25 mL via INTRAVENOUS

## 2016-03-13 MED ORDER — HEPARIN SODIUM (PORCINE) 1000 UNIT/ML DIALYSIS: 1000.0000 [IU] | INTRAMUSCULAR | Status: DC | PRN

## 2016-03-13 MED ORDER — CHLORHEXIDINE GLUCONATE 4 % EX LIQD
60.0000 mL | Freq: Once | CUTANEOUS | Status: AC
  Administered 2016-03-13: 4 via TOPICAL
  Filled 2016-03-13: qty 60

## 2016-03-13 MED ORDER — PENTAFLUOROPROP-TETRAFLUOROETH EX AERO: 1.0000 "application " | INHALATION_SPRAY | CUTANEOUS | Status: DC | PRN

## 2016-03-13 MED ORDER — LIDOCAINE-PRILOCAINE 2.5-2.5 % EX CREA: 1.0000 "application " | TOPICAL_CREAM | CUTANEOUS | Status: DC | PRN

## 2016-03-13 MED ORDER — CHLORPROMAZINE HCL 25 MG/ML IJ SOLN
25.0000 mg | Freq: Three times a day (TID) | INTRAMUSCULAR | Status: DC | PRN
  Administered 2016-03-13: 25 mg via INTRAMUSCULAR
  Filled 2016-03-13 (×3): qty 1

## 2016-03-13 MED ORDER — DEXTROSE 50 % IV SOLN
INTRAVENOUS | Status: AC
  Filled 2016-03-13: qty 50

## 2016-03-13 MED ORDER — PRO-STAT SUGAR FREE PO LIQD
30.0000 mL | Freq: Two times a day (BID) | ORAL | Status: DC
  Administered 2016-03-13 – 2016-03-17 (×7): 30 mL via ORAL
  Filled 2016-03-13 (×8): qty 30

## 2016-03-13 MED ORDER — HEPARIN SODIUM (PORCINE) 5000 UNIT/ML IJ SOLN
5000.0000 [IU] | Freq: Three times a day (TID) | INTRAMUSCULAR | Status: DC
  Administered 2016-03-13 – 2016-03-17 (×11): 5000 [IU] via SUBCUTANEOUS
  Filled 2016-03-13 (×9): qty 1

## 2016-03-13 MED ORDER — FENTANYL CITRATE (PF) 100 MCG/2ML IJ SOLN: 25.0000 ug | INTRAMUSCULAR | Status: DC | PRN

## 2016-03-13 MED ORDER — SODIUM CHLORIDE 0.9 % IV SOLN
100.0000 mL | INTRAVENOUS | Status: DC | PRN
Start: 2016-03-13 — End: 2016-03-13

## 2016-03-13 MED ORDER — DOXERCALCIFEROL 4 MCG/2ML IV SOLN
INTRAVENOUS | Status: AC
  Filled 2016-03-13: qty 2

## 2016-03-13 MED ORDER — FENTANYL CITRATE (PF) 100 MCG/2ML IJ SOLN
INTRAMUSCULAR | Status: DC | PRN
  Administered 2016-03-13 (×4): 25 ug via INTRAVENOUS

## 2016-03-13 MED ORDER — OXYCODONE HCL 5 MG/5ML PO SOLN: 5.0000 mg | Freq: Once | ORAL | Status: DC | PRN

## 2016-03-13 MED ORDER — LIDOCAINE HCL 2 % IJ SOLN
INTRAMUSCULAR | Status: AC
  Filled 2016-03-13: qty 20

## 2016-03-13 MED ORDER — ONDANSETRON HCL 4 MG/2ML IJ SOLN
INTRAMUSCULAR | Status: DC | PRN
  Administered 2016-03-13: 4 mg via INTRAVENOUS

## 2016-03-13 SURGICAL SUPPLY — 58 items
APL SKNCLS STERI-STRIP NONHPOA (GAUZE/BANDAGES/DRESSINGS) ×2
BANDAGE ELASTIC 3 VELCRO ST LF (GAUZE/BANDAGES/DRESSINGS) IMPLANT
BANDAGE ELASTIC 4 VELCRO ST LF (GAUZE/BANDAGES/DRESSINGS) ×2 IMPLANT
BENZOIN TINCTURE PRP APPL 2/3 (GAUZE/BANDAGES/DRESSINGS) ×2 IMPLANT
BLADE 10 SAFETY STRL DISP (BLADE) ×4 IMPLANT
BLADE AVERAGE 25MMX9MM (BLADE) ×1
BLADE AVERAGE 25X9 (BLADE) ×1 IMPLANT
BLADE SURG 10 STRL SS (BLADE) ×2 IMPLANT
BLADE SURG 15 STRL LF DISP TIS (BLADE) IMPLANT
BLADE SURG 15 STRL SS (BLADE) ×4
BNDG CMPR 9X4 STRL LF SNTH (GAUZE/BANDAGES/DRESSINGS)
BNDG CONFORM 2 STRL LF (GAUZE/BANDAGES/DRESSINGS) ×4 IMPLANT
BNDG ESMARK 4X9 LF (GAUZE/BANDAGES/DRESSINGS) IMPLANT
BNDG GAUZE ELAST 4 BULKY (GAUZE/BANDAGES/DRESSINGS) ×2 IMPLANT
CLOSURE WOUND 1/2 X4 (GAUZE/BANDAGES/DRESSINGS) ×1
COVER SURGICAL LIGHT HANDLE (MISCELLANEOUS) ×4 IMPLANT
CUFF TOURN SGL LL 12 NO SLV (MISCELLANEOUS) IMPLANT
CUFF TOURNIQUET SINGLE 18IN (TOURNIQUET CUFF) ×2 IMPLANT
DRSG EMULSION OIL 3X3 NADH (GAUZE/BANDAGES/DRESSINGS) IMPLANT
DRSG PAD ABDOMINAL 8X10 ST (GAUZE/BANDAGES/DRESSINGS) ×4 IMPLANT
DURAPREP 26ML APPLICATOR (WOUND CARE) ×4 IMPLANT
ELECT REM PT RETURN 9FT ADLT (ELECTROSURGICAL) ×4
ELECTRODE REM PT RTRN 9FT ADLT (ELECTROSURGICAL) ×2 IMPLANT
GAUZE IODOFORM PACK 1/2 7832 (GAUZE/BANDAGES/DRESSINGS) ×2 IMPLANT
GAUZE SPONGE 4X4 12PLY STRL (GAUZE/BANDAGES/DRESSINGS) ×2 IMPLANT
GAUZE XEROFORM 5X9 LF (GAUZE/BANDAGES/DRESSINGS) ×2 IMPLANT
GLOVE BIO SURGEON STRL SZ7.5 (GLOVE) ×4 IMPLANT
GLOVE BIO SURGEON STRL SZ8 (GLOVE) ×2 IMPLANT
GLOVE BIOGEL PI IND STRL 8 (GLOVE) IMPLANT
GLOVE BIOGEL PI INDICATOR 8 (GLOVE) ×2
GLOVE SURG SS PI 7.0 STRL IVOR (GLOVE) ×2 IMPLANT
GOWN STRL REUS W/ TWL LRG LVL3 (GOWN DISPOSABLE) ×4 IMPLANT
GOWN STRL REUS W/TWL LRG LVL3 (GOWN DISPOSABLE) ×8
HANDPIECE INTERPULSE COAX TIP (DISPOSABLE) ×4
KIT BASIN OR (CUSTOM PROCEDURE TRAY) ×4 IMPLANT
KIT ROOM TURNOVER OR (KITS) ×4 IMPLANT
MANIFOLD NEPTUNE II (INSTRUMENTS) ×2 IMPLANT
NDL 18GX1X1/2 (RX/OR ONLY) (NEEDLE) ×2 IMPLANT
NDL HYPO 25GX1X1/2 BEV (NEEDLE) IMPLANT
NEEDLE 18GX1X1/2 (RX/OR ONLY) (NEEDLE) ×4 IMPLANT
NEEDLE 27GAX1X1/2 (NEEDLE) ×2 IMPLANT
NEEDLE HYPO 25GX1X1/2 BEV (NEEDLE) ×8 IMPLANT
NS IRRIG 1000ML POUR BTL (IV SOLUTION) ×4 IMPLANT
PACK ORTHO EXTREMITY (CUSTOM PROCEDURE TRAY) ×4 IMPLANT
PAD ARMBOARD 7.5X6 YLW CONV (MISCELLANEOUS) ×8 IMPLANT
PAD CAST 4YDX4 CTTN HI CHSV (CAST SUPPLIES) IMPLANT
PADDING CAST COTTON 4X4 STRL (CAST SUPPLIES) ×4
SET HNDPC FAN SPRY TIP SCT (DISPOSABLE) IMPLANT
STAPLER VISISTAT 35W (STAPLE) ×2 IMPLANT
STRIP CLOSURE SKIN 1/2X4 (GAUZE/BANDAGES/DRESSINGS) ×1 IMPLANT
SUT ETHILON 4 0 PS 2 18 (SUTURE) ×4 IMPLANT
SUT ETHILON 5 0 PS 2 18 (SUTURE) IMPLANT
SUT PROLENE 3 0 PS 2 (SUTURE) ×2 IMPLANT
SUT VICRYL 4-0 PS2 18IN ABS (SUTURE) ×8 IMPLANT
SYR 3ML 25GX5/8 SAFETY (SYRINGE) IMPLANT
SYR CONTROL 10ML LL (SYRINGE) ×4 IMPLANT
TOWEL OR 17X24 6PK STRL BLUE (TOWEL DISPOSABLE) ×4 IMPLANT
TOWEL OR 17X26 10 PK STRL BLUE (TOWEL DISPOSABLE) ×4 IMPLANT

## 2016-03-13 NOTE — Progress Notes (Signed)
PROGRESS NOTE    [name]  SEG:[id number] DOB: 1951-01-29 DOA: 03/05/2016   PCP: Geoffery Lyons, MD   Brief Narrative:  Please see dictated H&P dated 03/05/2016 for details. 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 Patient had been followed closely by Dr. Paulla Dolly of [place] for chronic right foot wound.  Thus far during this hospital course, patient was continued on broad-spectrum antibiotics (vancomycin and Zosyn). MRI was obtained with no evidence of deeper infection including abscess or osteomyelitis. Patient's wounds continue to drain and general surgery was consulted. CT of the right leg was obtained and case was discussed with vascular surgery who felt patient was appropriate for amputation if indicated. Discussed case with general surgery who recommended discussing with patient's primary podiatrist. Discussed case with podiatry who has since recommended transfer to [facility] for formal evaluation and possible surgery. Transfer orders has since been placed.  Assessment & Plan:  1. Right foot cellulitis and wet gangrene with sepsis present on admission 1. Patient remains on vancomycin and Zosyn. We'll continue broad-spectrum antibiotics for now 2. Lactic acid noted to be within normal range 3. Plan to take to OR today, Dr. Amalia Hailey to perform transmetatarsal amputation with tendo Achilles lengthening right foot 4. Pt consented already and confirmed this AM  5. Per surgery, ABI also requested for LE's 2. End-stage renal disease 1. Patient normally dialyzes on Tuesdays Thursdays and Saturdays  prior to hospital admission 2. Patient has continued on a Monday Wednesday Friday schedule since admission 3. Appreciate nephrology team input  3. Hypertension, essential  1. Blood pressure currently stable, albeit soft 2. We'll continue to monitor at this time 4. Prior history of DVT 1. Chart reviewed 2. Patient has a history of lower extremity DVT and had been on Coumadin 3. Per records, IVC filter was placed in May 2014 as anticoagulation needed to be discontinued for planned lumbar surgery 4. Patient's Coumadin has since been continued 5. Patient denies family history of clots. Patient also denies more than 1 thromboembolic episode in his lifetime 6. Question patient's need for further Coumadin therapy. Discussed this with vascular surgery who recommended lower extremity Dopplers.  7. No evidence of DVT on doppler, discontinued therapeutic AC 10/12 8. Pt also given one dose of Vit K 10/12 to reverse INR, trend 3.99 --> 3.37 --> 1.86 5. Chronic anticoagulation 1. Per above, patient had previously remained on therapeutic Coumadin for the past several years 2. Discontinue as noted above  6. Diabetes mellitus with complications of nephropathy  1. We'll continue supplemental sliding scale insulin 7. Anemia, suspected secondary to chronic renal disease 1. Follow CBC, no signs of bleeding, Hg stable ~ 10 2. Transfuse as needed 8. Obesity 1. Body mass index is 31.52 kg/m  DVT prophylaxis: was on heparin, INR supra therapeutic, discontinued full dose AC, will resume SQ Hep post op Code Status: Full code Family Communication: Patient in room, family not at bedside Disposition Plan: Not ready for discharge yet, plan for amputation today, keep NPO  Consultants:   Vascular surgery  General surgery  Podiatry - Dr. Paulla Dolly  Nephrology  Procedures:   None  Antimicrobials:  Vancomycin 10/05 -->  Zosyn 10/05 -->  Subjective: No complaints at this time except  hiccups.  Objective: Vitals:   03/13/16 0830 03/13/16 0900 03/13/16 0930 03/13/16 1000  BP: (!) 130/40 (!) 130/39 (!) 133/37 109/67  Pulse: 98 98 100 (!) 101  Resp:      Temp:      TempSrc:      SpO2:      Weight:      Height:        Intake/Output Summary (Last 24 hours) at 03/13/16 1028 Last data filed at 03/13/16 0600  Gross per 24 hour  Intake              750 ml  Output                1 ml  Net              749 ml   Filed Weights   03/12/16 0022 03/12/16 2134 03/13/16 0710  Weight: 113.6 kg (250 lb 8 oz) 114.3 kg (252 lb) 114.4 kg (252 lb 3.3 oz)    Examination:  General exam: Appears calm and comfortable  Respiratory system: Clear to auscultation. Respiratory effort normal. Cardiovascular system: S1 & S2 heard, RRR. Gastrointestinal system: Abdomen is nondistended, soft and nontender. Periumbilical hernia, not tender. Normal bowel sounds heard. Central nervous system: Alert and oriented. No focal neurological deficits. Extremities:  edematous lower extremities bilaterally right greater than left. Bilateral lower extremity chronic venous stasis changes.  Skin: ischemic gangrene, wet, noted to digits 2 through 5 of the right foot with purulent draining ulcerations. Ulceration on the plantar medial aspect of the right great toe measuring approximately 4cm x 3cm x 0.3cm (L x W x D) Psychiatry: Judgement and insight appear normal. Mood & affect appropriate.   Data Reviewed: I have personally reviewed following labs and imaging studies  CBC:  Recent Labs Lab 03/09/16 0658 03/10/16 0552 03/11/16 0539 03/12/16 0942 03/13/16 0508  WBC 11.1* 12.6* 13.2* 10.3 9.8  HGB 10.5* 10.5* 10.4* 11.0* 10.6*  HCT 30.7* 31.1* 31.0* 32.5* 30.9*  MCV 81.4 82.3 80.7 80.2 78.4  PLT 245 251 318 315 161   Basic Metabolic Panel:  Recent Labs Lab 03/08/16 0156 03/08/16 0726 03/09/16 0658 03/10/16 0552 03/11/16 0539 03/12/16 0942 03/13/16 0508  NA 132* 134* 133* 134* 133* 133*  133*  K 3.9 4.1 4.3 3.9 4.3 4.5 4.4  CL 96* 97* 97* 97* 95* 96* 94*  CO2 27 27 26 28 27 24 27   GLUCOSE 78 78 69 88 86 89 81  BUN 29* 32* 41* 27* 34* 19 23*  CREATININE 6.45* 6.69* 7.67* 5.81* 6.94* 5.56* 6.53*  CALCIUM 8.0* 8.3* 8.1* 7.8* 8.2* 8.2* 8.4*  PHOS 3.3 3.3 3.5  --   --   --  3.9   GFR: Estimated Creatinine Clearance: 16 mL/min (by C-G formula based on SCr of 6.53 mg/dL (H)). Liver Function Tests:  Recent Labs Lab 03/08/16 0156 03/08/16 0726 03/09/16 0658 03/13/16 0508  ALBUMIN 2.1* 2.2* 2.1* 1.9*   Coagulation Profile:  Recent Labs Lab 03/09/16 0658 03/10/16 0552 03/11/16 0539 03/12/16 0942 03/13/16 0508  INR 3.50 3.90 3.99 3.37 1.86   CBG:  Recent Labs Lab 03/12/16 0751 03/12/16 1214 03/12/16 1726 03/12/16 2144 03/12/16 2307  GLUCAP 88 82 188* 69 95   Recent Results (from the past 240 hour(s))  Blood culture (routine x 2)     Status: None   Collection Time: 03/05/16  7:28 AM  Result Value Ref Range Status  Specimen Description BLOOD LEFT ARM  Final   Special Requests BOTTLES DRAWN AEROBIC ONLY 5CC  Final   Culture NO GROWTH 5 DAYS  Final   Report Status 03/10/2016 FINAL  Final  Blood culture (routine x 2)     Status: None   Collection Time: 03/05/16  7:28 AM  Result Value Ref Range Status   Specimen Description BLOOD LEFT HAND  Final   Special Requests   Final    BOTTLES DRAWN AEROBIC AND ANAEROBIC AEB=5CC ANA=3CC   Culture NO GROWTH 5 DAYS  Final   Report Status 03/10/2016 FINAL  Final  MRSA PCR Screening     Status: None   Collection Time: 03/05/16 10:47 AM  Result Value Ref Range Status   MRSA by PCR NEGATIVE NEGATIVE Final    Comment:        The GeneXpert MRSA Assay (FDA approved for NASAL specimens only), is one component of a comprehensive MRSA colonization surveillance program. It is not intended to diagnose MRSA infection nor to guide or monitor treatment for MRSA infections.      Radiology Studies: US Venous Img Lower  Bilateral  Result Date: 03/11/2016 CLINICAL DATA:  Right lower extremity pain and edema. History of end-stage renal disease. Patient has an IVC filter. EXAM: BILATERAL LOWER EXTREMITY VENOUS DOPPLER ULTRASOUND TECHNIQUE: Gray-scale sonography with graded compression, as well as color Doppler and duplex ultrasound were performed to evaluate the lower extremity deep venous systems from the level of the common femoral vein and including the common femoral, femoral, profunda femoral, popliteal and calf veins including the posterior tibial, peroneal and gastrocnemius veins when visible. The superficial great saphenous vein was also interrogated. Spectral Doppler was utilized to evaluate flow at rest and with distal augmentation maneuvers in the common femoral, femoral and popliteal veins. COMPARISON:  PET-CT 03/22/2015 FINDINGS: RIGHT LOWER EXTREMITY Common Femoral Vein: No evidence of thrombus. Normal compressibility, respiratory phasicity and color Doppler flow. Saphenofemoral Junction: No evidence of thrombus. Normal compressibility and flow on color Doppler imaging. Profunda Femoral Vein: No evidence of thrombus. Normal compressibility and flow on color Doppler imaging. Femoral Vein: No evidence of thrombus. Normal compressibility, respiratory phasicity and response to augmentation. Popliteal Vein: No evidence of thrombus. Normal compressibility, respiratory phasicity and response to augmentation. Calf Veins: Not visualized due to body habitus and edema. Other Findings: Enlarged right inguinal lymph node measuring 1.9 cm in the short axis. This was present on the previous PET-CT. LEFT LOWER EXTREMITY Common Femoral Vein: No evidence of thrombus. Normal compressibility, respiratory phasicity and color Doppler flow. Saphenofemoral Junction: No evidence of thrombus. Normal compressibility and flow on color Doppler imaging. Profunda Femoral Vein: No evidence of thrombus. Normal compressibility and flow on color  Doppler imaging. Femoral Vein: No evidence of thrombus. Normal compressibility, respiratory phasicity and response to augmentation. Popliteal Vein: No evidence of thrombus. Normal compressibility, respiratory phasicity and response to augmentation. Calf Veins: Not visualized. Other Findings: Prominent left inguinal lymph node measuring 0.9 cm in the short axis. IMPRESSION: No evidence of deep venous thrombosis. The deep calf veins could not be visualized due to the edema. Prominent bilateral inguinal lymph nodes are similar to the previous PET-CT. Electronically Signed   By: Markus Daft [name]   On: 03/11/2016 16:40    Scheduled Meds: . collagenase   Topical Daily  . doxercalciferol  2 mcg Intravenous Q M,W,F-HD  . feeding supplement (PRO-STAT SUGAR FREE 64)  30 mL Oral BID  . insulin aspart  0-5 Units Subcutaneous  QHS  . insulin aspart  0-9 Units Subcutaneous TID WC  . iron polysaccharides  150 mg Oral Daily  . multivitamin  1 tablet Oral QHS  . piperacillin-tazobactam (ZOSYN)  IV  3.375 g Intravenous Q12H  . sevelamer carbonate  800 mg Oral TID WC  . sodium chloride  1,000 mL Intravenous Once   And  . sodium chloride  1,000 mL Intravenous Once   And  . sodium chloride  1,000 mL Intravenous Once  . sodium chloride flush  3 mL Intravenous Q12H  . vancomycin  1,000 mg Intravenous Q M,W,F-HD   Continuous Infusions:    LOS: 8 days   Faye Ramsay, MD [facility] Pager [phone]  If 7PM-7AM, please contact night-coverage [email or web address] Password [email or web address] 03/13/2016, 10:28 AM

## 2016-03-13 NOTE — Anesthesia Postprocedure Evaluation (Signed)
Anesthesia Post Note  Patient: [name]  Procedure(s) Performed: Procedure(s) (LRB): TRANSMETATARSAL AMPUTATION (Right) ACHILLES LENGTHENING/KIDNER (Right)  Patient location during evaluation: PACU Anesthesia Type: General Level of consciousness: awake and alert Pain management: pain level controlled Vital Signs Assessment: post-procedure vital signs reviewed and stable Respiratory status: spontaneous breathing, nonlabored ventilation and respiratory function stable Cardiovascular status: blood pressure returned to baseline and stable Postop Assessment: no signs of nausea or vomiting Anesthetic complications: no    Last Vitals:  Vitals:   03/13/16 1928 03/13/16 1956  BP: 102/66 107/61  Pulse: 99 99  Resp: 11 18  Temp:  36.7 C    Last Pain:  Vitals:   03/13/16 1956  TempSrc: Oral  PainSc:                  Shelbylynn Walczyk A

## 2016-03-13 NOTE — Progress Notes (Signed)
[facility] Progress Note   Subjective: "I'm doing OK, just sleepy today". No C/Os. On HD tolerating well so far. For surgery this PM.   Objective Vitals:   03/13/16 0710 03/13/16 0715 03/13/16 0730 03/13/16 0800  BP: (!) 129/44 (!) 133/45 (!) 132/46 134/72  Pulse: 100 (!) 102 (!) 102 (!) 102  Resp: 18   18  Temp: 98 F (36.7 C)     TempSrc: Oral     SpO2: 100%     Weight: 114.4 kg (252 lb 3.3 oz)     Height:       Physical Exam General: Chronically ill appearing male in NAD Heart: HS slightly distant, S1, S2, No M/R/G Lungs: Bilateral breath sounds CTA anteriorly Abdomen:Abdomen soft, non-tender. Active BS X 4 quads.  Extremities: Bilateral 2+ LE edema. Ulcerated area R toe with eschar in center,guaze between toes, brown drainage.  Dialysis Access: RIJ TDC connected to blood lines. RUA AVF + bruit.    Additional Objective Labs: Basic Metabolic Panel:  Recent Labs Lab 03/08/16 0726 03/09/16 0658  03/11/16 0539 03/12/16 0942 03/13/16 0508  NA 134* 133*  < > 133* 133* 133*  K 4.1 4.3  < > 4.3 4.5 4.4  CL 97* 97*  < > 95* 96* 94*  CO2 27 26  < > 27 24 27   GLUCOSE 78 69  < > 86 89 81  BUN 32* 41*  < > 34* 19 23*  CREATININE 6.69* 7.67*  < > 6.94* 5.56* 6.53*  CALCIUM 8.3* 8.1*  < > 8.2* 8.2* 8.4*  PHOS 3.3 3.5  --   --   --  3.9  < > = values in this interval not displayed.   Recent Labs Lab 03/08/16 0726 03/09/16 0658 03/13/16 0508  ALBUMIN 2.2* 2.1* 1.9*     Recent Labs Lab 03/09/16 0658 03/10/16 0552 03/11/16 0539 03/12/16 0942 03/13/16 0508  WBC 11.1* 12.6* 13.2* 10.3 9.8  HGB 10.5* 10.5* 10.4* 11.0* 10.6*  HCT 30.7* 31.1* 31.0* 32.5* 30.9*  MCV 81.4 82.3 80.7 80.2 78.4  PLT 245 251 318 315 358   Blood Culture    Component Value Date/Time   SDES BLOOD LEFT ARM 03/05/2016 0728   SDES BLOOD LEFT HAND 03/05/2016 0728   SPECREQUEST BOTTLES DRAWN AEROBIC ONLY 5CC 03/05/2016 0728   SPECREQUEST  03/05/2016 0728    BOTTLES DRAWN  AEROBIC AND ANAEROBIC AEB=5CC ANA=3CC   CULT NO GROWTH 5 DAYS 03/05/2016 0728   CULT NO GROWTH 5 DAYS 03/05/2016 0728   REPTSTATUS 03/10/2016 FINAL 03/05/2016 0728   REPTSTATUS 03/10/2016 FINAL 03/05/2016 0728     Recent Labs Lab 03/12/16 0751 03/12/16 1214 03/12/16 1726 03/12/16 2144 03/12/16 2307  GLUCAP 88 82 188* 69 95   Studies/Results: US Venous Img Lower Bilateral  Result Date: 03/11/2016 CLINICAL DATA:  Right lower extremity pain and edema. History of end-stage renal disease. Patient has an IVC filter. EXAM: BILATERAL LOWER EXTREMITY VENOUS DOPPLER ULTRASOUND TECHNIQUE: Gray-scale sonography with graded compression, as well as color Doppler and duplex ultrasound were performed to evaluate the lower extremity deep venous systems from the level of the common femoral vein and including the common femoral, femoral, profunda femoral, popliteal and calf veins including the posterior tibial, peroneal and gastrocnemius veins when visible. The superficial great saphenous vein was also interrogated. Spectral Doppler was utilized to evaluate flow at rest and with distal augmentation maneuvers in the common femoral, femoral and popliteal veins. COMPARISON:  PET-CT 03/22/2015 FINDINGS: RIGHT LOWER  EXTREMITY Common Femoral Vein: No evidence of thrombus. Normal compressibility, respiratory phasicity and color Doppler flow. Saphenofemoral Junction: No evidence of thrombus. Normal compressibility and flow on color Doppler imaging. Profunda Femoral Vein: No evidence of thrombus. Normal compressibility and flow on color Doppler imaging. Femoral Vein: No evidence of thrombus. Normal compressibility, respiratory phasicity and response to augmentation. Popliteal Vein: No evidence of thrombus. Normal compressibility, respiratory phasicity and response to augmentation. Calf Veins: Not visualized due to body habitus and edema. Other Findings: Enlarged right inguinal lymph node measuring 1.9 cm in the short axis.  This was present on the previous PET-CT. LEFT LOWER EXTREMITY Common Femoral Vein: No evidence of thrombus. Normal compressibility, respiratory phasicity and color Doppler flow. Saphenofemoral Junction: No evidence of thrombus. Normal compressibility and flow on color Doppler imaging. Profunda Femoral Vein: No evidence of thrombus. Normal compressibility and flow on color Doppler imaging. Femoral Vein: No evidence of thrombus. Normal compressibility, respiratory phasicity and response to augmentation. Popliteal Vein: No evidence of thrombus. Normal compressibility, respiratory phasicity and response to augmentation. Calf Veins: Not visualized. Other Findings: Prominent left inguinal lymph node measuring 0.9 cm in the short axis. IMPRESSION: No evidence of deep venous thrombosis. The deep calf veins could not be visualized due to the edema. Prominent bilateral inguinal lymph nodes are similar to the previous PET-CT. Electronically Signed   By: Markus Daft [name]   On: 03/11/2016 16:40   Medications:   . collagenase   Topical Daily  . doxercalciferol  2 mcg Intravenous Q M,W,F-HD  . insulin aspart  0-5 Units Subcutaneous QHS  . insulin aspart  0-9 Units Subcutaneous TID WC  . iron polysaccharides  150 mg Oral Daily  . multivitamin  1 tablet Oral QHS  . piperacillin-tazobactam (ZOSYN)  IV  3.375 g Intravenous Q12H  . sevelamer carbonate  800 mg Oral TID WC  . sodium chloride  1,000 mL Intravenous Once   And  . sodium chloride  1,000 mL Intravenous Once   And  . sodium chloride  1,000 mL Intravenous Once  . sodium chloride flush  3 mL Intravenous Q12H  . vancomycin  1,000 mg Intravenous Q M,W,F-HD     Dialysis Orders: Center: [facility] on TTS 4 hours, 35 minutes, EDW 117.5kg (needs adjustment), 3K/2.5Ca, BFR 400, DFR 800, UF Profile 4  TDC/Maturing RUA AVF  - Heparin 12,000 units bolus q HD - Hectoral 2 mcg IV q HD - Mircera 229mcg IV q 2 weeks (last given 10/3) - Venofer 50mg  IV q  week  Assessment/Plan: 1. R foot cellulitis/gangrene: On Vanc/Zosyn. For transmetatarsal amputation with tendo Achilles lengthening right foot this afternoon per Dr. Amalia Hailey.  2. ESRD: TTS schedule at outpatient, was getting HD MWF while at [facility], last dialyzed 10/11. HD today. Short HD tomorrow/no heparin and resume TTS schedule next week. K+ 4.4 today.  3. Hypertension/volume: BP 119/80. Attempting UFG 2000. + LE edema. Pre wt 114.4 kg. Will continue lowering EDW as tolerated.  4.  Anemia: Hgb 10.6  No ESA for now. Follow HGB 5.  Metabolic bone disease: Ca 8.4, last Phos 3.9. Continue binder. Will add back VDRA. 6.  Nutrition: Albumin 1.9. Continue high protein diet. Says Nepro causes GI upset. Try prostat. 7.  DM: Per primary. 8.  Hx DVT on warfarin: INR supra therapeutic on admission. PT/INR 34.9/3.37 03/12/16 PT/INR 21.7/1.86 today. Coumadin on hold.  Rita H. Brown NP-C 03/13/2016, 8:51 AM  [employer] [phone]  I have seen and examined this patient  and agree with plan and assessment in the above note with renal recommendations/intervention highlighted. For surgery to R foot today. HD. Lower EDW as tolerated.  Zyona Pettaway B,MD 03/13/2016 10:10 AM

## 2016-03-13 NOTE — Procedures (Signed)
I have personally attended this patient's dialysis session.   Pre weight 114.3 (prev EDW 117.5) Goal 1.5-2 liters Continue to lower EDW as BP allows  Jamal Maes, MD [place] Pager 03/13/2016, 10:13 AM

## 2016-03-13 NOTE — Brief Op Note (Signed)
03/05/2016 - 03/13/2016  7:01 PM  PATIENT:  [name]  65 y.o. male  PRE-OPERATIVE DIAGNOSIS:  Transmetatarsal Right infected foot. Equinus right lower extremity.  POST-OPERATIVE DIAGNOSIS:  Transmetatarsal Right infected foot. Equinus right lower extremity.   PROCEDURE:  Procedure(s): TRANSMETATARSAL AMPUTATION (Right) ACHILLES LENGTHENING/KIDNER (Right)  SURGEON:  Surgeon(s) and Role:    * Edrick Kins, DPM - Primary    * Trula Slade, DPM - Assisting  PHYSICIAN ASSISTANT:   ASSISTANTS: none   ANESTHESIA:   local and MAC  EBL:  No intake/output data recorded.  BLOOD ADMINISTERED:none  DRAINS: none   LOCAL MEDICATIONS USED:  MARCAINE    and LIDOCAINE   SPECIMEN:  Source of Specimen:  right forefoot  DISPOSITION OF SPECIMEN:  PATHOLOGY  COUNTS:  YES  TOURNIQUET:   Total Tourniquet Time Documented: Calf (Right) - 25 minutes Total: Calf (Right) - 25 minutes   DICTATION: .Viviann Spare Dictation  PLAN OF CARE: Inpatient  PATIENT DISPOSITION:  PACU - hemodynamically stable.   Delay start of Pharmacological VTE agent (>24hrs) due to surgical blood loss or risk of bleeding: no

## 2016-03-13 NOTE — Progress Notes (Addendum)
Pharmacy Antibiotic Note  [name] is a 65 y.o. male admitted on 03/05/2016 at [facility] with osteomyelitis / sepsis.  Pharmacy was consulted on 03/05/16  for Vancomycin and Zosyn dosing. Transferred to [facility] on 03/11/16. MRI was obtained with no evidence of deeper infection including abscess or osteomyelitis. Day# 9 Vanc/zosyn: Remains on broad-spectrum IV antibiotics Vancomycin and Zosyn for right foot cellulitis/ sepsis. Lactic acid noted to be within normal range. WBC wnl, afebrile. ESRD on HD qMWF,  HD completed early this AM 10/13 -Pre HD Random vanc level this AM = 9 mg/ml pre HD (doses documented given appropriately after each HD since started vancomycin on 1gm after each HD-MWF)  -Goal Pre-HD Vancomycin level  =15-25 mcg/ml   Dr. Doyle Askew noted plan to take to OR today, Dr. Amalia Hailey to perform transmetatarsal amputation with tendo Achilles lengthening right foot  Plan: Increase vancomycin to 1250 mg IV qHD -MWF starting 03/16/16. Continue Zosyn 3.375gm IV q12h, EID Monitor labs, renal fxn, progress and c/s Deescalate ABX when improved / appropriate.    Height: 6\' 3"  (190.5 cm) Weight: 249 lb 1.9 oz (113 kg) IBW/kg (Calculated) : 84.5  Temp (24hrs), Avg:98.2 F (36.8 C), Min:98 F (36.7 C), Max:98.7 F (37.1 C)   Recent Labs Lab 03/09/16 0658 03/10/16 0552 03/11/16 0539 03/12/16 0942 03/13/16 0508 03/13/16 0744  WBC 11.1* 12.6* 13.2* 10.3 9.8  --   CREATININE 7.67* 5.81* 6.94* 5.56* 6.53*  --   VANCORANDOM  --   --   --   --   --  9    Estimated Creatinine Clearance: 15.9 mL/min (by C-G formula based on SCr of 6.53 mg/dL (H)).    Allergies  Allergen Reactions  . No Known Allergies     Antimicrobials this admission: Vancomycin 10/5 >>  Zosyn 10/5 >>   Dose adjustments this admission: 10/13: Give extra Vancomcyin 500mg  x1 on 10/13 then increase vancomycin to 1250 mg IV qHD -MWF starting 03/16/16.  Microbiology results:  Recent Results (from the past 240 hour(s))   Blood culture (routine x 2)     Status: None   Collection Time: 03/05/16  7:28 AM  Result Value Ref Range Status   Specimen Description BLOOD LEFT ARM  Final   Special Requests BOTTLES DRAWN AEROBIC ONLY 5CC  Final   Culture NO GROWTH 5 DAYS  Final   Report Status 03/10/2016 FINAL  Final  Blood culture (routine x 2)     Status: None   Collection Time: 03/05/16  7:28 AM  Result Value Ref Range Status   Specimen Description BLOOD LEFT HAND  Final   Special Requests   Final    BOTTLES DRAWN AEROBIC AND ANAEROBIC AEB=5CC ANA=3CC   Culture NO GROWTH 5 DAYS  Final   Report Status 03/10/2016 FINAL  Final  MRSA PCR Screening     Status: None   Collection Time: 03/05/16 10:47 AM  Result Value Ref Range Status   MRSA by PCR NEGATIVE NEGATIVE Final    Comment:        The GeneXpert MRSA Assay (FDA approved for NASAL specimens only), is one component of a comprehensive MRSA colonization surveillance program. It is not intended to diagnose MRSA infection nor to guide or monitor treatment for MRSA infections.    Thank you for allowing pharmacy to be a part of this patient's care.  Nicole Cella, RPh Clinical Pharmacist 03/13/2016 2:44 PM   ADDENDUM:  Usual HD on TTS outpatient but had HD on MWF at [facility]-  Nephrology plans for a short HD tomorrow Sat 10/14 and resume HD qTTS as done outpt.   PLAN:  -will give 750mg  vancomycin IV x1 after short HD 10/14 Saturday due to preHD <15 on 10/13, then increase Vancomycin to 1250 mg qHD -TTS (starting Tuesday 03/17/16). Continue zosyn as previously ordered.  Nicole Cella, RPh Clinical Pharmacist Pager: [phone] 03/13/2016 3:35 PM

## 2016-03-13 NOTE — Anesthesia Preprocedure Evaluation (Addendum)
Anesthesia Evaluation  Patient identified by MRN, date of birth, ID band Patient awake    Reviewed: Allergy & Precautions, NPO status , Patient's Chart, lab work & pertinent test results  Airway Mallampati: II  TM Distance: >3 FB Neck ROM: Full    Dental  (+) Edentulous Upper   Pulmonary shortness of breath, sleep apnea ,    Pulmonary exam normal breath sounds clear to auscultation       Cardiovascular hypertension, Pt. on medications (-) angina+ Peripheral Vascular Disease and + DVT  (-) Past MI Normal cardiovascular exam Rhythm:Regular Rate:Normal     Neuro/Psych negative neurological ROS  negative psych ROS   GI/Hepatic Neg liver ROS, GERD  ,  Endo/Other  diabetes, Type 2, Insulin Dependent  Renal/GU Dialysis and ESRFRenal disease (K+ 4.1)  negative genitourinary   Musculoskeletal  (+) Arthritis ,   Abdominal   Peds negative pediatric ROS (+)  Hematology  (+) Blood dyscrasia, anemia ,   Anesthesia Other Findings   Reproductive/Obstetrics negative OB ROS                            Anesthesia Physical Anesthesia Plan  ASA: III  Anesthesia Plan: General   Post-op Pain Management:    Induction: Intravenous  Airway Management Planned: LMA  Additional Equipment:   Intra-op Plan:   Post-operative Plan: Extubation in OR  Informed Consent: I have reviewed the patients History and Physical, chart, labs and discussed the procedure including the risks, benefits and alternatives for the proposed anesthesia with the patient or authorized representative who has indicated his/her understanding and acceptance.   Dental advisory given  Plan Discussed with: CRNA and Surgeon  Anesthesia Plan Comments:         Anesthesia Quick Evaluation

## 2016-03-13 NOTE — Transfer of Care (Signed)
Immediate Anesthesia Transfer of Care Note  Patient: [name]  Procedure(s) Performed: Procedure(s): TRANSMETATARSAL AMPUTATION (Right) ACHILLES LENGTHENING/KIDNER (Right)  Patient Location: PACU  Anesthesia Type:General  Level of Consciousness: sedated  Airway & Oxygen Therapy: Patient Spontanous Breathing and Patient connected to face mask oxygen  Post-op Assessment: Report given to RN and Post -op Vital signs reviewed and stable  Post vital signs: Reviewed and stable  Last Vitals:  Vitals:   03/13/16 1645 03/13/16 1912  BP: 112/65   Pulse: (!) 105   Resp: 18   Temp: 36.9 C (P) 36.3 C    Last Pain:  Vitals:   03/13/16 1912  TempSrc:   PainSc: (P) Asleep      Patients Stated Pain Goal: 2 (67/01/10 0349)  Complications: No apparent anesthesia complications

## 2016-03-13 NOTE — Anesthesia Procedure Notes (Signed)
Procedure Name: LMA Insertion Date/Time: 03/13/2016 5:57 PM Performed by: Maude Leriche D Pre-anesthesia Checklist: Patient identified, Suction available, Emergency Drugs available, Patient being monitored and Timeout performed Patient Re-evaluated:Patient Re-evaluated prior to inductionOxygen Delivery Method: Circle system utilized Preoxygenation: Pre-oxygenation with 100% oxygen Intubation Type: IV induction Ventilation: Mask ventilation without difficulty LMA: LMA inserted LMA Size: 5.0 Placement Confirmation: positive ETCO2 and breath sounds checked- equal and bilateral Tube secured with: Tape Dental Injury: Teeth and Oropharynx as per pre-operative assessment

## 2016-03-13 NOTE — Progress Notes (Signed)
IV team unable to start a Saline lock.

## 2016-03-13 NOTE — Progress Notes (Signed)
VASCULAR LAB PRELIMINARY  ARTERIAL  ABI completed: Brachial waveforms difficult to evaluate due to right active dialysis access fistula and left failed dialysis access fistula. Unable to ascertain ABI's bilaterally due to non compressible vessels possibly due to calcification of the vessels with abnormal doppler waveforms.  Left waveforms appear biphasic. Right great toe pressure could not be obtained due to open wound.  Left TBI of 0.68 is borderline abnormal at rest.    RIGHT    LEFT    PRESSURE WAVEFORM  PRESSURE WAVEFORM  BRACHIAL HD Fistula  BRACHIAL 182   DP >254 Monophasic DP >254 Biphasic  PT >254 Monophasic PT >254 Biphasic  GREAT TOE Wound NA GREAT TOE 124 NA    RIGHT LEFT  ABI 1.4 1.4     Legrand Como, RVT 03/13/2016, 3:58 PM

## 2016-03-14 LAB — RENAL FUNCTION PANEL
Albumin: 1.7 g/dL — ABNORMAL LOW (ref 3.5–5.0)
Anion gap: 10 (ref 5–15)
BUN: 23 mg/dL — ABNORMAL HIGH (ref 6–20)
CO2: 25 mmol/L (ref 22–32)
Calcium: 7.7 mg/dL — ABNORMAL LOW (ref 8.9–10.3)
Chloride: 96 mmol/L — ABNORMAL LOW (ref 101–111)
Creatinine, Ser: 5.71 mg/dL — ABNORMAL HIGH (ref 0.61–1.24)
GFR calc Af Amer: 11 mL/min — ABNORMAL LOW (ref >60)
GFR calc non Af Amer: 10 mL/min — ABNORMAL LOW (ref >60)
Glucose, Bld: 166 mg/dL — ABNORMAL HIGH (ref 65–99)
Phosphorus: 3.9 mg/dL (ref 2.5–4.6)
Potassium: 4.4 mmol/L (ref 3.5–5.1)
Sodium: 131 mmol/L — ABNORMAL LOW (ref 135–145)

## 2016-03-14 LAB — BASIC METABOLIC PANEL
Anion gap: 10 (ref 5–15)
BUN: 23 mg/dL — ABNORMAL HIGH (ref 6–20)
CO2: 25 mmol/L (ref 22–32)
Calcium: 7.7 mg/dL — ABNORMAL LOW (ref 8.9–10.3)
Chloride: 96 mmol/L — ABNORMAL LOW (ref 101–111)
Creatinine, Ser: 5.72 mg/dL — ABNORMAL HIGH (ref 0.61–1.24)
GFR calc Af Amer: 11 mL/min — ABNORMAL LOW (ref >60)
GFR calc non Af Amer: 9 mL/min — ABNORMAL LOW (ref >60)
Glucose, Bld: 161 mg/dL — ABNORMAL HIGH (ref 65–99)
Potassium: 4.4 mmol/L (ref 3.5–5.1)
Sodium: 131 mmol/L — ABNORMAL LOW (ref 135–145)

## 2016-03-14 LAB — CBC
HCT: 27.5 % — ABNORMAL LOW (ref 39.0–52.0)
Hemoglobin: 9.2 g/dL — ABNORMAL LOW (ref 13.0–17.0)
MCH: 26.4 pg (ref 26.0–34.0)
MCHC: 33.5 g/dL (ref 30.0–36.0)
MCV: 79 fL (ref 78.0–100.0)
Platelets: 332 10*3/uL (ref 150–400)
RBC: 3.48 MIL/uL — ABNORMAL LOW (ref 4.22–5.81)
RDW: 16.9 % — ABNORMAL HIGH (ref 11.5–15.5)
WBC: 10.8 10*3/uL — ABNORMAL HIGH (ref 4.0–10.5)

## 2016-03-14 LAB — GLUCOSE, CAPILLARY
Glucose-Capillary: 114 mg/dL — ABNORMAL HIGH (ref 65–99)
Glucose-Capillary: 143 mg/dL — ABNORMAL HIGH (ref 65–99)
Glucose-Capillary: 119 mg/dL — ABNORMAL HIGH (ref 65–99)

## 2016-03-14 MED ORDER — ALBUMIN HUMAN 25 % IV SOLN
25.0000 g | Freq: Once | INTRAVENOUS | Status: AC
  Administered 2016-03-14: 25 g via INTRAVENOUS

## 2016-03-14 MED ORDER — VANCOMYCIN HCL IN DEXTROSE 750-5 MG/150ML-% IV SOLN
INTRAVENOUS | Status: AC
  Filled 2016-03-14: qty 150

## 2016-03-14 MED ORDER — ALBUMIN HUMAN 25 % IV SOLN
INTRAVENOUS | Status: AC
  Administered 2016-03-14: 25 g via INTRAVENOUS
  Filled 2016-03-14: qty 100

## 2016-03-14 NOTE — Progress Notes (Signed)
Orthopedic Tech Progress Note Patient Details:  [name] 1950-08-03 [id number]  Ortho Devices Type of Ortho Device: CAM walker Ortho Device/Splint Location: rle Ortho Device/Splint Interventions: Application   [name] 03/14/2016, 7:07 AM

## 2016-03-14 NOTE — Op Note (Signed)
03/05/2016 - 03/13/2016  5:52 PM  PATIENT:  [name]  65 y.o. male  PRE-OPERATIVE DIAGNOSIS:  Transmetatarsal Right infected foot  POST-OPERATIVE DIAGNOSIS:  Transmetatarsal Right infected foot  PROCEDURE:  Procedure(s): TRANSMETATARSAL AMPUTATION (Right) ACHILLES LENGTHENING/KIDNER (Right)  SURGEON:  Surgeon(s) and Role:    * Edrick Kins, DPM - Primary    * Trula Slade, DPM - Assisting  PHYSICIAN ASSISTANT:   ASSISTANTS: none   ANESTHESIA:   local and MAC  EBL:  Total I/O In: 260 [P.O.:60; IV Piggyback:200] Out: 1750 [Other:1750]  BLOOD ADMINISTERED:none  DRAINS: none   LOCAL MEDICATIONS USED:  MARCAINE    and LIDOCAINE   SPECIMEN:  No Specimen  DISPOSITION OF SPECIMEN:  PATHOLOGY  COUNTS:  YES  TOURNIQUET:   Total Tourniquet Time Documented: Calf (Right) - 25 minutes Total: Calf (Right) - 25 minutes   DICTATION: .Viviann Spare Dictation  PLAN OF CARE: N/A  PATIENT DISPOSITION:  PACU - hemodynamically stable.   Delay start of Pharmacological VTE agent (>24hrs) due to surgical blood loss or risk of bleeding: not applicable  JUSTIFICATION FOR PROCEDURE: 65 yr old male with a history of Diabetes Mellitus presents as inpatient with cellulitis and gangrenous changes to the right foot. Patient was transferred from [facility] to [facility] and podiatry was consulted.  Conservative methods of treatment have failed and patient was informed he will require surgical intervention due to the acute gangrenous changes of the right foot.  The patient was told benefits as well as possible side effects of the surgery. The patient consented for surgical correction. The patient consent form was reviewed all patient questions were answered she's were expressed or implied. The patient and the surgeon boson the patient consent form with the witness present and placed in the patient's chart.  PROCEDURE IN DETAIL: The patient identified [name] was  brought to the operating room placed the operating table in supine position at which time an aseptic scrub and drape were performed about the patient's right lower extremity after anesthesia was induced as described above. A right ankle tourniquet was inflated to a pressure of 3 in her milligrams mercury and attention was directed to the right forefoot where procedure #1 commenced.  Procedure #1: Tendo Achilles lengthening right foot 3 percutaneous transverse skin incisions were planned and made to the posterior aspect of the patient's right ankle bisecting the Achilles tendon. 2 incisions were made beginning at the bisection of the Achilles tendon and extended medially releasing the medial half of the Achilles tendon. The middle percutaneous skin incision was utilized bisecting the Achilles tendon and extending laterally releasing the lateral aspect of the Achilles tendon of the right foot.  Increased dorsiflexion was noted and equinus deformity was corrected. The end incision sites were then primarily closed using 4-0 Prolene suture. Attention was then directed to the right forefoot where procedure #2 commenced.  Procedure #2: Transmetatarsal application right foot A fishmouth type incision was planned and made about the patient's right forefoot. Incision was performed using a #10 blade an incision was carried down the level of bone with care taken to cut clamped ligated or retract all neurovascular structures traversing the incision site. Exposure of the middle aspect of metatarsals 1 through 5 was created using sharp dissection and a #38 blade mounted on a sagittal saw was utilized to create an osteotomy across metatarsals 1 through 5 in the middle aspect of the metatarsal shafts. All osteotomies were performed in a dorsal distal  to proximal plantar orientation. After the osteotomy was performed the forefoot was grasped with a sharp towel clamp and the forefoot was removed in toto in place and sterile  specimen container and sent to pathology. A rasp was then utilized to smooth all bony protuberances and sharp edges of the osteotomy sites.  Intraoperative x-ray fluoroscopy was utilized to visualize the metatarsal parabola which was remaining and which was satisfactory. All exposed extensor and flexor tendons were then grasped with a clamp and distracted distally and a tenotomy was performed as far proximally as could be visualized.  The right ankle tourniquet was then deflated and all bleeding arteries within the incision site were either cauterized, were tied to prevent hematoma formation. At this time 3 L of normal saline with the Simpulse was utilized for copious irrigation of the amputation site.  After copious irrigation of the amputation site was then dried in preparation for soft tissue closure. White stainless steel skin staples were utilized to reapproximate superficial skin edges and 1/2 inch iodoform packing was utilized and packed within the amputation stump site.  Dry sterile compressive dressing was then applied to all previously mentioned incision sites about the patient's right lower extremity. Orders for cam boot placement were placed.  The patient was then transferred from the operating room to the recovery room having tolerated the procedure and anesthesia well. All vital signs are stable. After brief stay in the recovery room the patient was readmitted to his room with prescriptions for adequate analgesic care for in-hospital use. Verbal as well as written instructions were provided to the patient regarding wound care. The patient is to keep the dressings clean dry and intact until he is a follow surgeon Dr. Daylene [name] in the office upon discharge.  Podiatry will follow the patient as inpatient.   Dr. Edrick Kins, [place]

## 2016-03-14 NOTE — Progress Notes (Signed)
[facility] Progress Note   Subjective: Seen in HD Having some pain from R TMA done yesterday 10/13 BP's soft on HD Pre HD weight 114.2 (well under EDW but with LOTS of edema) No SOB.    Objective Vitals:   03/13/16 1912 03/13/16 1928 03/13/16 1956 03/14/16 0449  BP: 107/67 102/66 107/61 (!) 101/55  Pulse: 100 99 99 (!) 110  Resp: 19 11 18 19   Temp: 97.3 F (36.3 C)  98 F (36.7 C) 98.6 F (37 C)  TempSrc:   Oral Oral  SpO2: 100% 96% 99% 100%  Weight:   112.7 kg (248 lb 7.3 oz)   Height:       Physical Exam Very nice AAM seen in the HD unit Awake, alert, oriented, having some foot pain Lungs anteriorly clear Regular rhythm 90's S1S2 No S3 Abdomen protuberant 3+ edema both LE's, with sacral edema, pitting as high as armpits Dialysis access: R upper arm BVT AVF (01/24/16) and TDC (in use for HD)   Recent Labs Lab 03/08/16 0726 03/09/16 0658  03/11/16 0539 03/12/16 0942 03/13/16 0508 03/13/16 1730  NA 134* 133*  < > 133* 133* 133* 135  K 4.1 4.3  < > 4.3 4.5 4.4 4.1  CL 97* 97*  < > 95* 96* 94*  --   CO2 27 26  < > 27 24 27   --   GLUCOSE 78 69  < > 86 89 81 71  BUN 32* 41*  < > 34* 19 23*  --   CREATININE 6.69* 7.67*  < > 6.94* 5.56* 6.53*  --   CALCIUM 8.3* 8.1*  < > 8.2* 8.2* 8.4*  --   PHOS 3.3 3.5  --   --   --  3.9  --   < > = values in this interval not displayed.   Recent Labs Lab 03/08/16 0726 03/09/16 0658 03/13/16 0508  ALBUMIN 2.2* 2.1* 1.9*     Recent Labs Lab 03/09/16 0658 03/10/16 0552 03/11/16 0539 03/12/16 0942 03/13/16 0508 03/13/16 1730  WBC 11.1* 12.6* 13.2* 10.3 9.8  --   HGB 10.5* 10.5* 10.4* 11.0* 10.6* 12.2*  HCT 30.7* 31.1* 31.0* 32.5* 30.9* 36.0*  MCV 81.4 82.3 80.7 80.2 78.4  --   PLT 245 251 318 315 358  --    Blood Culture    Component Value Date/Time   SDES BLOOD LEFT ARM 03/05/2016 0728   SDES BLOOD LEFT HAND 03/05/2016 0728   SPECREQUEST BOTTLES DRAWN AEROBIC ONLY 5CC 03/05/2016 0728   SPECREQUEST  03/05/2016 0728    BOTTLES DRAWN AEROBIC AND ANAEROBIC AEB=5CC ANA=3CC   CULT NO GROWTH 5 DAYS 03/05/2016 0728   CULT NO GROWTH 5 DAYS 03/05/2016 0728   REPTSTATUS 03/10/2016 FINAL 03/05/2016 0728   REPTSTATUS 03/10/2016 FINAL 03/05/2016 0728     Recent Labs Lab 03/13/16 1143 03/13/16 1642 03/13/16 1838 03/13/16 1922 03/13/16 1955  GLUCAP 71 71 78 84 82   Studies/Results: Dg Foot Complete Right  Result Date: 03/13/2016 CLINICAL DATA:  Postop EXAM: RIGHT FOOT COMPLETE - 3+ VIEW COMPARISON:  MRI 03/05/2016, radiograph 03/05/2016 FINDINGS: There is interim amputation of the distal foot at the level of the mid 1 through 5 metatarsals. Cutaneous staples are in place. Cut edges of the bone are sharp. No periostitis or erosive change. Small pockets of soft tissue gas posterior to the distal tibia and fibula. Vascular calcifications are present. Moderate calcaneal spur. IMPRESSION: Interim amputation of the distal digits of the right foot  at the level of the mid first through fifth mid metatarsals with expected postsurgical changes. Vascular calcification. Electronically Signed   By: Donavan Foil [name]   On: 03/13/2016 20:54   Medications:   . collagenase   Topical Daily  . doxercalciferol  2 mcg Intravenous Q M,W,F-HD  . feeding supplement (PRO-STAT SUGAR FREE 64)  30 mL Oral BID  . heparin subcutaneous  5,000 Units Subcutaneous Q8H  . insulin aspart  0-5 Units Subcutaneous QHS  . insulin aspart  0-9 Units Subcutaneous TID WC  . iron polysaccharides  150 mg Oral Daily  . multivitamin  1 tablet Oral QHS  . piperacillin-tazobactam (ZOSYN)  IV  3.375 g Intravenous Q12H  . sevelamer carbonate  800 mg Oral TID WC  . sodium chloride  1,000 mL Intravenous Once   And  . sodium chloride  1,000 mL Intravenous Once   And  . sodium chloride  1,000 mL Intravenous Once  . sodium chloride flush  3 mL Intravenous Q12H  . [START ON 03/17/2016] vancomycin  1,250 mg Intravenous Q  T,Th,Sa-HD  . vancomycin  750 mg Intravenous Q T,Th,Sa-HD     Dialysis Orders: Center: [facility] on TTS 4 hours, 35 minutes,  EDW 117.5kg Will have lower EDW  3K/2.5Ca, BFR 400, DFR 800, UF Profile 4  TDC/Maturing RUA AVF (01/24/16) - Heparin 12,000 units bolus q HD - Hectoral 2 mcg IV q HD - Mircera 290mcg IV q 2 weeks (last given 10/3) - Venofer 50mg  IV q week  Assessment/Plan: 1. R foot cellulitis/gangrene: On Vanc/Zosyn. S/p R TMA 10/13 Dr. Daylene Katayama.   2. ESRD: TTS schedule at outpatient. Was off schedule prior to transfer from [facility]. Had HD yesterday (Friday) and for short treatment today to get back on schedule. 3 kg under EDW but with anasarca and needs progressive EDW lowering. BP is limiting factor. Albumin for BP support today with HD. May need addition of midodrine 3. Anemia: Hgb 10.6  Labs pending from today. Last outpt Mircera was 67/6. 4. Metabolic bone disease: Continue renvela and hectorol with HD 5. Nutrition: Albumin 1.9. Continue high protein diet. Says Nepro causes GI upset. Prostat. 6. DM: Per primary 7. Hx DVT on warfarin: INR supra therapeutic on admission. Pharmacy managing.  Jamal Maes, MD [facility] [phone] Pager 03/14/2016, 8:27 AM

## 2016-03-14 NOTE — Procedures (Signed)
I have personally attended this patient's dialysis session.   3 kg under EDW with anasarca. Attempting to lower EDW Albumin today for BP support L TDC 400 no issues  Jamal Maes, MD [place] Pager 03/14/2016, 8:31 AM

## 2016-03-14 NOTE — Progress Notes (Signed)
PROGRESS NOTE    WOODFIN KISS  WEX:[id number] DOB: 02/21/1951 DOA: 03/05/2016   PCP: Geoffery Lyons, MD   Brief Narrative:  Please see dictated H&P dated 03/05/2016 for details. 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 Patient had been followed closely by Dr. Paulla Dolly of [place] for chronic right foot wound.  Thus far during this hospital course, patient was continued on broad-spectrum antibiotics (vancomycin and Zosyn). MRI was obtained with no evidence of deeper infection including abscess or osteomyelitis. Patient's wounds continue to drain and general surgery was consulted. CT of the right leg was obtained and case was discussed with vascular surgery who felt patient was appropriate for amputation if indicated. Discussed case with general surgery who recommended discussing with patient's primary podiatrist. Discussed case with podiatry who has since recommended transfer to [facility] for formal evaluation and possible surgery. Transfer orders has since been placed.  Assessment & Plan:  1. Right foot cellulitis and wet gangrene with sepsis present on admission 1. Patient remains on vancomycin and Zosyn. We'll continue broad-spectrum antibiotics for now with plan to narrow down 24 hours post surgery  2. Pt is now s/p right transmetatarsal amputation and achilles lengthening, post op day #1 with mild pain but overall stable  3. ABI done and results reviewed, Right great toe pressure could not be obtained due to open wound.  4. Left TBI of 0.68 is borderline abnormal at rest. 5. This will required close  monitoring upon discharge  2. End-stage renal disease 1. Patient normally dialyzes on Tuesdays Thursdays and Saturdays prior to hospital admission 2. Patient has continued on a Monday Wednesday Friday schedule since admission 3. Appreciate nephrology team input  4. Pt still with lots of edema, EDW 114 kg  3. Hypertension, essential  1. Blood pressure currently stable 2. We'll continue to monitor at this time 4. Prior history of DVT 1. Chart reviewed 2. Patient has a history of lower extremity DVT and had been on Coumadin 3. Per records, IVC filter was placed in May 2014 as anticoagulation needed to be discontinued for planned lumbar surgery 4. Patient denies family history of clots. Patient also denies more than 1 thromboembolic episode in his lifetime 5. Question patient's need for further Coumadin therapy. Discussed this with vascular surgery who recommended lower extremity Dopplers.  6. No evidence of DVT on doppler, discontinued therapeutic AC 10/12 7. Pt also given one dose of Vit K 10/12 to reverse INR pre op, trend 3.99 --> 3.37 --> 1.86 8. We have resumed subQ Hep for DVT prophylaxis  5. Chronic anticoagulation 1. Per above, patient had previously remained on therapeutic Coumadin for the past several years 2. Discontinued as noted above  6. Diabetes mellitus with complications of nephropathy  1. We'll continue supplemental sliding scale insulin 7. Anemia, suspected secondary to chronic renal disease 1. Follow CBC, no signs of bleeding, Hg stable ~ 9 - 10 2. Transfuse as needed 8. Obesity 1. Body mass index is 31.52 kg/m  DVT prophylaxis: was on heparin, INR supra therapeutic, discontinued full dose AC, resumed SQ Hep post op Code Status: Full code Family Communication: Patient in room, family not at bedside Disposition Plan: Not  ready for discharge yet  Consultants:   Vascular surgery  General surgery  Podiatry - Dr. Paulla Dolly  Nephrology  Procedures:    None  Antimicrobials:  Vancomycin 10/05 -->  Zosyn 10/05 -->  Subjective: No complaints at this time except hiccups.  Objective: Vitals:   03/14/16 0900 03/14/16 0930 03/14/16 1000 03/14/16 1211  BP: 105/70 105/70 106/67 117/70  Pulse: (!) 106 (!) 107 (!) 107 (!) 110  Resp:    19  Temp:    98.1 F (36.7 C)  TempSrc:    Oral  SpO2:    100%  Weight:      Height:        Intake/Output Summary (Last 24 hours) at 03/14/16 1252 Last data filed at 03/14/16 1212  Gross per 24 hour  Intake              980 ml  Output               75 ml  Net              905 ml   Filed Weights   03/13/16 1140 03/13/16 1956 03/14/16 0807  Weight: 113 kg (249 lb 1.9 oz) 112.7 kg (248 lb 7.3 oz) 114.2 kg (251 lb 12.3 oz)    Examination:  General exam: Appears calm and comfortable  Respiratory system: Clear to auscultation. Respiratory effort normal. Cardiovascular system: S1 & S2 heard, RRR. Gastrointestinal system: Abdomen is nondistended, soft and nontender. Periumbilical hernia, not tender. Normal bowel sounds heard. Central nervous system: Alert and oriented. No focal neurological deficits. Extremities:  edematous lower extremities bilaterally right greater than left. Bilateral lower extremity chronic venous stasis changes.  Skin: right foot in wraps, still with significant LE edema bilaterally, HD access: R upper arm BVT AVF (01/24/16) and TDC (in use for HD) Psychiatry: Judgement and insight appear normal. Mood & affect appropriate.   Data Reviewed: I have personally reviewed following labs and imaging studies  CBC:  Recent Labs Lab 03/10/16 0552 03/11/16 0539 03/12/16 0942 03/13/16 0508 03/13/16 1730 03/14/16 0830  WBC 12.6* 13.2* 10.3 9.8  --  10.8*  HGB 10.5* 10.4* 11.0* 10.6* 12.2* 9.2*  HCT 31.1* 31.0* 32.5* 30.9* 36.0* 27.5*  MCV 82.3 80.7 80.2 78.4  --  79.0  PLT 251 318 315 358  --  213   Basic Metabolic Panel:  Recent Labs Lab 03/08/16 0156 03/08/16 0726  03/09/16 0658 03/10/16 0552 03/11/16 0539 03/12/16 0942 03/13/16 0508 03/13/16 1730 03/14/16 1138  NA 132* 134* 133* 134* 133* 133* 133* 135 131*  K 3.9 4.1 4.3 3.9 4.3 4.5 4.4 4.1 4.4  CL 96* 97* 97* 97* 95* 96* 94*  --  96*  CO2 27 27 26 28 27 24 27   --  25  GLUCOSE 78 78 69 88 86 89 81 71 166*  BUN 29* 32* 41* 27* 34* 19 23*  --  23*  CREATININE 6.45* 6.69* 7.67* 5.81* 6.94* 5.56* 6.53*  --  5.71*  CALCIUM 8.0* 8.3* 8.1* 7.8* 8.2* 8.2* 8.4*  --  7.7*  PHOS 3.3 3.3 3.5  --   --   --  3.9  --  3.9  Liver Function Tests:  Recent Labs Lab 03/08/16 0156 03/08/16 0726 03/09/16 0658 03/13/16 0508 03/14/16 1138  ALBUMIN 2.1* 2.2* 2.1* 1.9* 1.7*   Coagulation Profile:  Recent Labs Lab 03/09/16 0658 03/10/16 0552 03/11/16 0539 03/12/16 0942 03/13/16 0508  INR 3.50 3.90 3.99 3.37 1.86   CBG:  Recent Labs Lab 03/13/16 1642 03/13/16 1838 03/13/16 1922 03/13/16 1955 03/14/16 1210  GLUCAP 71 78 84 82 114*   Recent Results (from the past 240 hour(s))  Blood culture (routine x 2)     Status: None   Collection Time: 03/05/16  7:28 AM  Result Value Ref Range Status   Specimen Description BLOOD LEFT ARM  Final   Special Requests BOTTLES DRAWN AEROBIC ONLY 5CC  Final   Culture NO GROWTH 5 DAYS  Final   Report Status 03/10/2016 FINAL  Final  Blood culture (routine x 2)     Status: None   Collection Time: 03/05/16  7:28 AM  Result Value Ref Range Status   Specimen Description BLOOD LEFT HAND  Final   Special Requests   Final    BOTTLES DRAWN AEROBIC AND ANAEROBIC AEB=5CC ANA=3CC   Culture NO GROWTH 5 DAYS  Final   Report Status 03/10/2016 FINAL  Final  MRSA PCR Screening     Status: None   Collection Time: 03/05/16 10:47 AM  Result Value Ref Range Status   MRSA by PCR NEGATIVE NEGATIVE Final    Comment:        The GeneXpert MRSA Assay (FDA approved for NASAL specimens only), is one component of a comprehensive MRSA colonization surveillance program. It is  not intended to diagnose MRSA infection nor to guide or monitor treatment for MRSA infections.      Radiology Studies: Dg Foot Complete Right  Result Date: 03/13/2016 CLINICAL DATA:  Postop EXAM: RIGHT FOOT COMPLETE - 3+ VIEW COMPARISON:  MRI 03/05/2016, radiograph 03/05/2016 FINDINGS: There is interim amputation of the distal foot at the level of the mid 1 through 5 metatarsals. Cutaneous staples are in place. Cut edges of the bone are sharp. No periostitis or erosive change. Small pockets of soft tissue gas posterior to the distal tibia and fibula. Vascular calcifications are present. Moderate calcaneal spur. IMPRESSION: Interim amputation of the distal digits of the right foot at the level of the mid first through fifth mid metatarsals with expected postsurgical changes. Vascular calcification. Electronically Signed   By: Donavan Foil [name]   On: 03/13/2016 20:54    Scheduled Meds: . doxercalciferol  2 mcg Intravenous Q M,W,F-HD  . feeding supplement (PRO-STAT SUGAR FREE 64)  30 mL Oral BID  . heparin subcutaneous  5,000 Units Subcutaneous Q8H  . insulin aspart  0-5 Units Subcutaneous QHS  . insulin aspart  0-9 Units Subcutaneous TID WC  . iron polysaccharides  150 mg Oral Daily  . multivitamin  1 tablet Oral QHS  . piperacillin-tazobactam (ZOSYN)  IV  3.375 g Intravenous Q12H  . sevelamer carbonate  800 mg Oral TID WC  . sodium chloride  1,000 mL Intravenous Once   And  . sodium chloride  1,000 mL Intravenous Once   And  . sodium chloride  1,000 mL Intravenous Once  . sodium chloride flush  3 mL Intravenous Q12H  . [START ON 03/17/2016] vancomycin  1,250 mg Intravenous Q T,Th,Sa-HD   Continuous Infusions:    LOS: 9 days   Faye Ramsay, MD [facility] Pager [phone]  If 7PM-7AM, please contact night-coverage [email or web address] Password [email or web address] 03/14/2016, 12:52 PM

## 2016-03-15 LAB — GLUCOSE, CAPILLARY
Glucose-Capillary: 97 mg/dL (ref 65–99)
Glucose-Capillary: 94 mg/dL (ref 65–99)
Glucose-Capillary: 113 mg/dL — ABNORMAL HIGH (ref 65–99)
Glucose-Capillary: 150 mg/dL — ABNORMAL HIGH (ref 65–99)

## 2016-03-15 MED ORDER — DARBEPOETIN ALFA 60 MCG/0.3ML IJ SOSY
60.0000 ug | PREFILLED_SYRINGE | INTRAMUSCULAR | Status: DC
  Filled 2016-03-15: qty 0.3

## 2016-03-15 MED ORDER — DOXYCYCLINE HYCLATE 100 MG PO TABS
100.0000 mg | ORAL_TABLET | Freq: Two times a day (BID) | ORAL | Status: DC
  Administered 2016-03-15 – 2016-03-17 (×5): 100 mg via ORAL
  Filled 2016-03-15 (×5): qty 1

## 2016-03-15 NOTE — Progress Notes (Signed)
Subjective:  Patient presents today at bedside is inpatient status post transmetatarsal amputation with tendo Achilles lengthening of the right lower extremity. Date of surgery was 03/13/2016. Patient states that his pain is minimal and he is in minimal discomfort.    Objective/Physical Exam General: The patient is alert and oriented x3 in no acute distress.  Dermatology: Incision sites are well coapted with stainless steel skin staples intact. Minimal drainage noted.  Skin to the right lower extremity is warm, dry and presents with increased warmth versus the contralateral side. Negative for open lesions or macerations.  Vascular: Diminished pedal pulses bilaterally. Edema noted to the right lower extremity extending proximally to the calf.  Neurological: Epicritic and protective threshold grossly absent bilaterally.   Musculoskeletal Exam: Status post transmetatarsal amputation with Achilles tendon lengthening right lower extremity. Hemodynamically stable.  Radiographic Exam:  Status post transmetatarsal amputation right foot noted. Please see radiographic exam postoperative findings. Postop lateral view of the right lower extremity demonstrates gas to the posterior ankle secondary to incision sites of tendo Achilles lengthening.    Assessment: #1 status post transmetatarsal amputation right foot #2 status post tendo Achilles lengthening right   Plan of Care:  #1 Patient was evaluated. #2 today dressings were changed and amputation stump was evaluated. Incisional packing removed. #3 patient is to be strictly nonweightbearing to the right lower extremity with cam boot. #4 from a surgical standpoint, the patient is stable. Intraoperative soft tissue margins were clean with no evidence of proximal tissue cellulitis or gangrene. #5 recommend antibiotic therapy per attending physician #6 patient okay to be discharged from a podiatry standpoint, pending arrangements for rehabilitation  therapy.  #7 upon discharge, patient instructed to follow-up with surgeon, Dr. Daylene Katayama, [place] for postoperative management care.  Dr. Edrick Kins, [place]

## 2016-03-15 NOTE — Progress Notes (Signed)
PROGRESS NOTE    [name]  ZOX:[id number] DOB: 11-Dec-1950 DOA: 03/05/2016   PCP: Geoffery Lyons, MD   Brief Narrative:  Please see dictated H&P dated 03/05/2016 for details. 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 Patient had been followed closely by Dr. Paulla Dolly of [place] for chronic right foot wound.  Thus far during this hospital course, patient was continued on broad-spectrum antibiotics (vancomycin and Zosyn). MRI was obtained with no evidence of deeper infection including abscess or osteomyelitis. Patient's wounds continue to drain and general surgery was consulted. CT of the right leg was obtained and case was discussed with vascular surgery who felt patient was appropriate for amputation if indicated. Discussed case with general surgery who recommended discussing with patient's primary podiatrist. Discussed case with podiatry who has since recommended transfer to [facility] for formal evaluation and possible surgery. Transfer orders has since been placed.  Assessment & Plan:  1. Right foot cellulitis and wet gangrene with sepsis present on admission 1. Patient remains on vancomycin and Zosyn. Today is day #11 of ABX. Will transition to oral ABX doxycycline  2. 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 ABI done and results reviewed, Right great toe pressure could not be obtained due to open wound.  4. Left TBI of 0.68 is borderline abnormal at rest. 5. This will required close monitoring upon discharge   6. Per Dr. Amalia Hailey pt can be discharged from his stand point  2. End-stage renal disease 1. Patient normally dialyzes on Tuesdays Thursdays and Saturdays prior to hospital admission 2. Patient has continued on a Monday Wednesday Friday schedule since admission 3. Appreciate nephrology team input  4. Pt still with lots of edema 5. Weight trend: 114 --> 110 kg on 03/14/2016  3. Hypertension, essential  1. Blood pressure currently stable 2. We'll continue to monitor at this time 4. Prior history of DVT 1. Chart reviewed 2. Patient has a history of lower extremity DVT and had been on Coumadin 3. Per records, IVC filter was placed in May 2014 as anticoagulation needed to be discontinued for planned lumbar surgery 4. Patient denies family history of clots. Patient also denies more than 1 thromboembolic episode in his lifetime 5. Question patient's need for further Coumadin therapy. Discussed this with vascular surgery who recommended lower extremity Dopplers.  6. No evidence of DVT on doppler, discontinued therapeutic AC 10/12 7. Pt also given one dose of Vit K 10/12 to reverse INR pre op, trend 3.99 --> 3.37 --> 1.86 8. We have resumed subQ Hep for DVT prophylaxis  5. Chronic anticoagulation 1. Per above, patient had previously remained on therapeutic Coumadin for the past several years 2. Discontinued as noted above  6. Diabetes mellitus with complications of nephropathy  1. We'll continue supplemental sliding scale insulin 7. Anemia, suspected secondary to chronic renal disease 1. Follow CBC, no signs of bleeding, Hg stable ~ 9 - 10 2. Transfuse as needed 8. Obesity 1. Body mass index is 31.52 kg/m  DVT prophylaxis: was on heparin, INR supra therapeutic, discontinued full dose AC, resumed SQ Hep post op  Code Status: Full code Family Communication: Patient in room, family not at bedside Disposition Plan: suspect discharge 1-2 days   Consultants:   Vascular surgery  General  surgery  Podiatry - Dr. Amalia Hailey  Nephrology  Procedures:   None  Antimicrobials:  Vancomycin 10/05 -->  Zosyn 10/05 -->  Subjective: No complaints at this time except hiccups.  Objective: Vitals:   03/14/16 1727 03/14/16 2122 03/15/16 0541 03/15/16 0927  BP: (!) 117/57 106/71 124/83 112/68  Pulse: (!) 112 (!) 110 (!) 110 (!) 114  Resp: 16 18 17 18   Temp: 99.7 F (37.6 C) 98.1 F (36.7 C) 98.7 F (37.1 C) 98.9 F (37.2 C)  TempSrc: Oral Oral Oral Oral  SpO2: 95% 100% 100% 100%  Weight:  110.8 kg (244 lb 4.3 oz)    Height:        Intake/Output Summary (Last 24 hours) at 03/15/16 1235 Last data filed at 03/15/16 0927  Gross per 24 hour  Intake              300 ml  Output                0 ml  Net              300 ml   Filed Weights   03/14/16 0807 03/14/16 1107 03/14/16 2122  Weight: 114.2 kg (251 lb 12.3 oz) 112.5 kg (248 lb 0.3 oz) 110.8 kg (244 lb 4.3 oz)    Examination:  General exam: Appears calm and comfortable  Respiratory system: Clear to auscultation. Respiratory effort normal. Cardiovascular system: S1 & S2 heard, slightly tachy  Gastrointestinal system: Abdomen is nondistended, soft and nontender. Periumbilical hernia, not tender. Normal bowel sounds heard. Central nervous system: Alert and oriented. No focal neurological deficits. Extremities:  edematous lower extremities bilaterally right greater than left. Bilateral lower extremity chronic venous stasis changes.  Skin: right foot in wraps, still with significant LE edema bilaterally, HD access: R upper arm BVT AVF (01/24/16) and TDC (in use for HD)  Data Reviewed: I have personally reviewed following labs and imaging studies  CBC:  Recent Labs Lab 03/10/16 0552 03/11/16 0539 03/12/16 0942 03/13/16 0508 03/13/16 1730 03/14/16 0830  WBC 12.6* 13.2* 10.3 9.8  --  10.8*  HGB 10.5* 10.4* 11.0* 10.6* 12.2* 9.2*  HCT 31.1* 31.0* 32.5* 30.9* 36.0* 27.5*  MCV 82.3 80.7 80.2 78.4  --  79.0  PLT  251 318 315 358  --  938   Basic Metabolic Panel:  Recent Labs Lab 03/09/16 0658  03/11/16 0539 03/12/16 0942 03/13/16 0508 03/13/16 1730 03/14/16 0830 03/14/16 1138  NA 133*  < > 133* 133* 133* 135 131* 131*  K 4.3  < > 4.3 4.5 4.4 4.1 4.4 4.4  CL 97*  < > 95* 96* 94*  --  96* 96*  CO2 26  < > 27 24 27   --  25 25  GLUCOSE 69  < > 86 89 81 71 161* 166*  BUN 41*  < > 34* 19 23*  --  23* 23*  CREATININE 7.67*  < > 6.94* 5.56* 6.53*  --  5.72* 5.71*  CALCIUM 8.1*  < > 8.2* 8.2* 8.4*  --  7.7* 7.7*  PHOS 3.5  --   --   --  3.9  --   --  3.9  < > = values in this interval not displayed.   Liver Function Tests:  Recent Labs Lab 03/09/16 0658 03/13/16 0508 03/14/16  1138  ALBUMIN 2.1* 1.9* 1.7*   Coagulation Profile:  Recent Labs Lab 03/09/16 0658 03/10/16 0552 03/11/16 0539 03/12/16 0942 03/13/16 0508  INR 3.50 3.90 3.99 3.37 1.86   CBG:  Recent Labs Lab 03/14/16 1210 03/14/16 1642 03/14/16 2121 03/15/16 0750 03/15/16 1213  GLUCAP 114* 143* 119* 97 94   Radiology Studies: Dg Foot Complete Right  Result Date: 03/13/2016 CLINICAL DATA:  Postop EXAM: RIGHT FOOT COMPLETE - 3+ VIEW COMPARISON:  MRI 03/05/2016, radiograph 03/05/2016 FINDINGS: There is interim amputation of the distal foot at the level of the mid 1 through 5 metatarsals. Cutaneous staples are in place. Cut edges of the bone are sharp. No periostitis or erosive change. Small pockets of soft tissue gas posterior to the distal tibia and fibula. Vascular calcifications are present. Moderate calcaneal spur. IMPRESSION: Interim amputation of the distal digits of the right foot at the level of the mid first through fifth mid metatarsals with expected postsurgical changes. Vascular calcification. Electronically Signed   By: Donavan Foil [name]   On: 03/13/2016 20:54    Scheduled Meds: . [START ON 03/17/2016] darbepoetin (ARANESP) injection - DIALYSIS  60 mcg Intravenous Q Tue-HD  . doxercalciferol  2 mcg  Intravenous Q M,W,F-HD  . feeding supplement (PRO-STAT SUGAR FREE 64)  30 mL Oral BID  . heparin subcutaneous  5,000 Units Subcutaneous Q8H  . insulin aspart  0-5 Units Subcutaneous QHS  . insulin aspart  0-9 Units Subcutaneous TID WC  . iron polysaccharides  150 mg Oral Daily  . multivitamin  1 tablet Oral QHS  . piperacillin-tazobactam (ZOSYN)  IV  3.375 g Intravenous Q12H  . sevelamer carbonate  800 mg Oral TID WC  . sodium chloride  1,000 mL Intravenous Once   And  . sodium chloride  1,000 mL Intravenous Once   And  . sodium chloride  1,000 mL Intravenous Once  . sodium chloride flush  3 mL Intravenous Q12H  . [START ON 03/17/2016] vancomycin  1,250 mg Intravenous Q T,Th,Sa-HD   Continuous Infusions:    LOS: 10 days   Faye Ramsay, MD [facility] Pager [phone]  If 7PM-7AM, please contact night-coverage [email or web address] Password [email or web address] 03/15/2016, 12:35 PM

## 2016-03-15 NOTE — Progress Notes (Signed)
[name] Progress Note   Subjective:   C/o R foot pain s/p TMA 10/13. Denies CP or dyspnea. No N/V or fever.Slowly inching weight down with dialysis.  Objective Vitals:   03/14/16 1727 03/14/16 2122 03/15/16 0541 03/15/16 0927  BP: (!) 117/57 106/71 124/83 112/68  Pulse: (!) 112 (!) 110 (!) 110 (!) 114  Resp: 16 18 17 18   Temp: 99.7 F (37.6 C) 98.1 F (36.7 C) 98.7 F (37.1 C) 98.9 F (37.2 C)  TempSrc: Oral Oral Oral Oral  SpO2: 95% 100% 100% 100%  Weight:  110.8 kg (244 lb 4.3 oz)    Height:       Physical Exam General: Pleasant male. NAD. Heart: RRR; no murmur. Lungs: CTA anteriorly (unable to listen posteriorly due to pain with moving) Extremities: 2+ LE edema; R foot bandaged Dialysis Access: LUE AVF (01/24/16), using L chest PC for now.   Recent Labs Lab 03/09/16 0658  03/13/16 0508 03/13/16 1730 03/14/16 0830 03/14/16 1138  NA 133*  < > 133* 135 131* 131*  K 4.3  < > 4.4 4.1 4.4 4.4  CL 97*  < > 94*  --  96* 96*  CO2 26  < > 27  --  25 25  GLUCOSE 69  < > 81 71 161* 166*  BUN 41*  < > 23*  --  23* 23*  CREATININE 7.67*  < > 6.53*  --  5.72* 5.71*  CALCIUM 8.1*  < > 8.4*  --  7.7* 7.7*  PHOS 3.5  --  3.9  --   --  3.9  < > = values in this interval not displayed.   Recent Labs Lab 03/09/16 0658 03/13/16 0508 03/14/16 1138  ALBUMIN 2.1* 1.9* 1.7*   CBC:  Recent Labs Lab 03/10/16 0552 03/11/16 0539 03/12/16 0942 03/13/16 0508 03/13/16 1730 03/14/16 0830  WBC 12.6* 13.2* 10.3 9.8  --  10.8*  HGB 10.5* 10.4* 11.0* 10.6* 12.2* 9.2*  HCT 31.1* 31.0* 32.5* 30.9* 36.0* 27.5*  MCV 82.3 80.7 80.2 78.4  --  79.0  PLT 251 318 315 358  --  332   CBG:  Recent Labs Lab 03/13/16 1955 03/14/16 1210 03/14/16 1642 03/14/16 2121 03/15/16 0750  GLUCAP 82 114* 143* 119* 97   Studies/Results: Dg Foot Complete Right  Result Date: 03/13/2016 CLINICAL DATA:  Postop EXAM: RIGHT FOOT COMPLETE - 3+ VIEW COMPARISON:  MRI 03/05/2016,  radiograph 03/05/2016 FINDINGS: There is interim amputation of the distal foot at the level of the mid 1 through 5 metatarsals. Cutaneous staples are in place. Cut edges of the bone are sharp. No periostitis or erosive change. Small pockets of soft tissue gas posterior to the distal tibia and fibula. Vascular calcifications are present. Moderate calcaneal spur. IMPRESSION: Interim amputation of the distal digits of the right foot at the level of the mid first through fifth mid metatarsals with expected postsurgical changes. Vascular calcification. Electronically Signed   By: [name] [name]   On: 03/13/2016 20:54   Medications:   . doxercalciferol  2 mcg Intravenous Q M,W,F-HD  . feeding supplement (PRO-STAT SUGAR FREE 64)  30 mL Oral BID  . heparin subcutaneous  5,000 Units Subcutaneous Q8H  . insulin aspart  0-5 Units Subcutaneous QHS  . insulin aspart  0-9 Units Subcutaneous TID WC  . iron polysaccharides  150 mg Oral Daily  . multivitamin  1 tablet Oral QHS  . piperacillin-tazobactam (ZOSYN)  IV  3.375 g Intravenous Q12H  .  sevelamer carbonate  800 mg Oral TID WC  . sodium chloride  1,000 mL Intravenous Once   And  . sodium chloride  1,000 mL Intravenous Once   And  . sodium chloride  1,000 mL Intravenous Once  . sodium chloride flush  3 mL Intravenous Q12H  . [START ON 03/17/2016] vancomycin  1,250 mg Intravenous Q T,Th,Sa-HD    Dialysis Orders: [facility] on TTS 4 hours, 35 minutes,  EDW 117.5kg Will have lower EDW  3K/2.5Ca, BFR 400, DFR 800, UF Profile 4  TDC/Maturing RUA AVF (01/24/16) - Heparin 12,000 units bolus q HD - Hectoral 2 mcg IV q HD - Mircera 267mcg IV q 2 weeks (last given 10/3) - Venofer 50mg  IV q week  Assessment/Plan: 1. R foot cellulitis/gangrene: On Vanc/Zosyn. S/p R TMA 10/13 by Dr. Daylene [name].   2. ESRD: TTS schedule at outpatient. Was off schedule prior to transfer from [facility], now back on TTS schedule. Has anasarca, trying to continue to lower prior  EDW. S/p albumin for BP support with HD on 10/14 due to low BP. May need addition of midodrine. 3. BP/Volume: As above. Low side BP with + edema/anasarca. Last post-HD weight 112.5kg, continue to reduce EDW with HD as tolerated. 4. Anemia: Hgb 9.2. Last outpt Mircera was 10/3. Starting Aranesp 11mcg weekly with next HD. 5. Metabolic bone disease: Continue renvela and hectorol with HD. 6. Nutrition: Albumin 1.7. Continue high protein diet, Prostate. Says Nepro causes GI upset.  7. DM: Per primary 8. Hx DVT on warfarin: INR supra therapeutic on admission. Now off warfarin, per primary.  John Penton, PA-C 03/15/2016, 9:09 AM  [name] Pager: [phone]  I have seen and examined this patient and agree with plan and assessment in the above note with renal recommendations/intervention highlighted. Still quite a bit of foot pain. On vanco and zosyn.  Next due for HD on Tuesday (now back on schedule) and will lower EDW as tolerated for his significant edema.  [name] B,MD 03/15/2016 10:33 AM

## 2016-03-15 NOTE — Evaluation (Signed)
Physical Therapy Evaluation Patient Details Name: [name] MRN: [id number] DOB: Nov 06, 1950 Today's Date: 03/15/2016   History of Present Illness  [name] is a 65 y.o. male with ESRD, DM with neuropathy, GERD, HTN, HL, Hx DVT with IVC filter, Hx Non-Hodgkins lymphoma (s/p chemo) who was admitted to [facility] on 10/5 for sepsis related to R foot cellulitis/gangrene. He was started on Vanc/Zosyn and has had extensive evaluation showing PAD with heavily calcified arteries in the R leg, there was recommendation for amputation of at least 3 toes, possibly more. He was transferred to [facility] on 10/11 for further evaluation by his primary podiatrist and likely surgery.; now s/p R transmet amputation and Achilles tendon lenthening   Clinical Impression  Pt admitted with above diagnosis. Pt currently with functional limitations due to the deficits listed below (see PT Problem List). Diffuculty maintaining NWB with any standing attempts; at this point lateral scooting will be the best mode of transfers; Pt lives alone, and will need to be independent to dc home; rec SNF for rehab;  Pt will benefit from skilled PT to increase their independence and safety with mobility to allow discharge to the venue listed below.       Follow Up Recommendations SNF    Equipment Recommendations  Wheelchair (measurements PT);Wheelchair cushion (measurements PT) (drop-arm 3in1)    Recommendations for Other Services       Precautions / Restrictions Precautions Precautions: Fall Required Braces or Orthoses: Other Brace/Splint Other Brace/Splint: Cam Boot RLE Restrictions Weight Bearing Restrictions: Yes RLE Weight Bearing: Non weight bearing Other Position/Activity Restrictions: Per conversation with Dr. Amalia Hailey      Mobility  Bed Mobility Overal bed mobility: Needs Assistance Bed Mobility: Supine to Sit     Supine to sit: Min guard     General bed mobility comments: Cues for technique; used  rails, no physical assist needed  Transfers Overall transfer level: Needs assistance Equipment used: Rolling walker (2 wheeled);1 person hand held assist Transfers: Sit to/from [facility];Lateral/Scoot Transfers Sit to Stand: Max assist Stand pivot transfers: Max assist      Lateral/Scoot Transfers: Mod assist General transfer comment: Unable to push up on LLE only for sit to stand and stand pivot transfers; difficulty maintaining NWB RLE, so employed lateral scoot technique to transfer bed to chair with drop-arm down; mod assist to postiion LEs for transfera dn to hold chair steady  Ambulation/Gait                Stairs            Wheelchair Mobility    Modified Rankin (Stroke Patients Only)       Balance                                             Pertinent Vitals/Pain Pain Assessment: 0-10 Pain Score: 7  Pain Location: R foot, expecially with foot in dependent position Pain Descriptors / Indicators: Aching Pain Intervention(s): Limited activity within patient's tolerance;Monitored during session;Premedicated before session;Repositioned    Home Living Family/patient expects to be discharged to:: Skilled nursing facility Living Arrangements: Alone                    Prior Function Level of Independence: Independent               Hand Dominance  Extremity/Trunk Assessment   Upper Extremity Assessment: Overall WFL for tasks assessed           Lower Extremity Assessment: Generalized weakness;RLE deficits/detail RLE Deficits / Details: s/p R transmet amputation; Educated pt on donning/doffing Cam boot; noted post-op swelling foot and ankle; difficulty maintaining NWB during attempts at stand pivot transfers       Communication   Communication: No difficulties  Cognition Arousal/Alertness: Awake/alert Behavior During Therapy: WFL for tasks assessed/performed Overall Cognitive Status: Within  Functional Limits for tasks assessed                      General Comments      Exercises     Assessment/Plan    PT Assessment Patient needs continued PT services  PT Problem List Decreased strength;Decreased range of motion;Decreased activity tolerance;Decreased balance;Decreased mobility;Decreased knowledge of use of DME;Pain;Decreased safety awareness;Decreased knowledge of precautions          PT Treatment Interventions DME instruction;Gait training;Functional mobility training;Therapeutic activities;Therapeutic exercise;Balance training;Neuromuscular re-education;Cognitive remediation;Patient/family education    PT Goals (Current goals can be found in the Care Plan section)  Acute Rehab PT Goals Patient Stated Goal: be able to get OOB PT Goal Formulation: With patient Time For Goal Achievement: 03/29/16 Potential to Achieve Goals: Good    Frequency Min 3X/week   Barriers to discharge        Co-evaluation               End of Session Equipment Utilized During Treatment: Gait belt Activity Tolerance: Patient tolerated treatment well Patient left: in chair;with call bell/phone within reach Nurse Communication: Mobility status         Time: 1191-4782 PT Time Calculation (min) (ACUTE ONLY): 27 min   Charges:   PT Evaluation $PT Eval Moderate Complexity: 1 Procedure PT Treatments $Therapeutic Activity: 8-22 mins   PT G Codes:        Quin Hoop 03/15/2016, 5:16 PM  Roney Marion, [place] Pager [phone] Office [phone]

## 2016-03-16 ENCOUNTER — Encounter (HOSPITAL_COMMUNITY): Payer: Self-pay | Admitting: Podiatry

## 2016-03-16 LAB — RENAL FUNCTION PANEL
Albumin: 1.7 g/dL — ABNORMAL LOW (ref 3.5–5.0)
Anion gap: 10 (ref 5–15)
BUN: 32 mg/dL — ABNORMAL HIGH (ref 6–20)
CO2: 27 mmol/L (ref 22–32)
Calcium: 8.2 mg/dL — ABNORMAL LOW (ref 8.9–10.3)
Chloride: 97 mmol/L — ABNORMAL LOW (ref 101–111)
Creatinine, Ser: 6.16 mg/dL — ABNORMAL HIGH (ref 0.61–1.24)
GFR calc Af Amer: 10 mL/min — ABNORMAL LOW (ref >60)
GFR calc non Af Amer: 9 mL/min — ABNORMAL LOW (ref >60)
Glucose, Bld: 112 mg/dL — ABNORMAL HIGH (ref 65–99)
Phosphorus: 3.9 mg/dL (ref 2.5–4.6)
Potassium: 4.3 mmol/L (ref 3.5–5.1)
Sodium: 134 mmol/L — ABNORMAL LOW (ref 135–145)

## 2016-03-16 LAB — GLUCOSE, CAPILLARY
Glucose-Capillary: 83 mg/dL (ref 65–99)
Glucose-Capillary: 91 mg/dL (ref 65–99)

## 2016-03-16 LAB — CBC
HCT: 25.6 % — ABNORMAL LOW (ref 39.0–52.0)
Hemoglobin: 8.6 g/dL — ABNORMAL LOW (ref 13.0–17.0)
MCH: 26.5 pg (ref 26.0–34.0)
MCHC: 33.6 g/dL (ref 30.0–36.0)
MCV: 78.8 fL (ref 78.0–100.0)
Platelets: 340 10*3/uL (ref 150–400)
RBC: 3.25 MIL/uL — ABNORMAL LOW (ref 4.22–5.81)
RDW: 17 % — ABNORMAL HIGH (ref 11.5–15.5)
WBC: 12 10*3/uL — ABNORMAL HIGH (ref 4.0–10.5)

## 2016-03-16 NOTE — Progress Notes (Signed)
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 John had been followed closely by Dr. Paulla Dolly of [place] for chronic right foot wound.  Thus far during this hospital course, John was continued on broad-spectrum antibiotics (vancomycin and Zosyn). MRI was obtained with no evidence of deeper infection including abscess or osteomyelitis. John's wounds continue to drain and general surgery was consulted. CT of the right leg was obtained and case was discussed with vascular surgery who felt John was appropriate for amputation if indicated. Discussed case with general surgery who recommended discussing with John's primary podiatrist. Discussed case with podiatry who has since recommended transfer to [facility] for formal evaluation and possible surgery. Transfer orders has since been placed.  Assessment & Plan:  1. Right foot cellulitis and wet gangrene with sepsis present on admission 1. John completed vancomycin and Zosyn for 11 days, transitioned to oral ABX doxycycline 10/15 2. 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 I am bit worried that CBC is up this AM and since we changed to PO ABX I think it is important to continue to monitor this. Absence of fevers is however encouraging  4. ABI done and results reviewed, Right great toe  pressure could not be obtained due to open wound.  5. Left TBI of 0.68 is borderline abnormal at rest. 6. This will required close monitoring upon discharge  7. Per Dr. Amalia Hailey pt can be discharged from his stand point  2. End-stage renal disease 1. John normally dialyzes on Tuesdays Thursdays and Saturdays prior to hospital admission 2. John has continued on a Monday Wednesday Friday schedule since admission 3. Appreciate nephrology team input  4. Pt still with lots of edema 5. Weight trend: 114 --> 110 kg on 03/14/2016  3. [name], essential  1. Blood pressure currently stable 2. We'll continue to monitor at this time 4. Prior history of DVT 1. Chart reviewed 2. John has a history of lower extremity DVT and had been on Coumadin 3. Per records, IVC filter was placed in May 2014 as anticoagulation needed to be discontinued for planned lumbar surgery 4. John denies family history of clots. John also denies more than 1 thromboembolic episode in his lifetime 5. Question John's need for further Coumadin therapy. Discussed this with vascular surgery who recommended lower extremity Dopplers.  6. No evidence of DVT on doppler, discontinued therapeutic AC 10/12 7. Pt also given one dose of Vit K 10/12 to reverse INR pre op, trend 3.99 --> 3.37 --> 1.86 8. We have resumed subQ Hep for DVT prophylaxis  5. Chronic anticoagulation 1. Per above, John had previously remained on therapeutic Coumadin for the past several years 2. Discontinued as noted above  6. Diabetes mellitus with complications of nephropathy  1. We'll continue supplemental sliding scale insulin 7. Anemia, suspected secondary to chronic renal disease 1. Follow CBC, no signs of bleeding, Hg stable ~ 9 - 10 2.  Transfuse as needed 8. Obesity 1. Body mass index is 31.52 kg/m  DVT prophylaxis: was on heparin, INR supra therapeutic, discontinued full dose AC, resumed SQ Hep post op Code Status: Full  code Family Communication: John in room, family not at bedside Disposition Plan: suspect discharge 1-2 days   Consultants:   Vascular surgery  General surgery  Podiatry - Dr. Amalia Hailey  Nephrology  Procedures:   None  Antimicrobials:  Vancomycin 10/05 --> 10/15  Zosyn 10/05 --> 10/15  Doxycycline 10/15 -->  Subjective: No complaints at this time.  Objective: Vitals:   03/16/16 1000 03/16/16 1030 03/16/16 1100 03/16/16 1117  BP: 120/68 123/68 122/70 (!) 90/56  Pulse: 96 96 96 80  Resp:    18  Temp:    98 F (36.7 C)  TempSrc:    Oral  SpO2:    98%  Weight:    108.9 kg (240 lb 1.3 oz)  Height:        Intake/Output Summary (Last 24 hours) at 03/16/16 1211 Last data filed at 03/16/16 1117  Gross per 24 hour  Intake              360 ml  Output             4000 ml  Net            -3640 ml   Filed Weights   03/14/16 2122 03/16/16 0647 03/16/16 1117  Weight: 110.8 kg (244 lb 4.3 oz) 113.8 kg (250 lb 14.1 oz) 108.9 kg (240 lb 1.3 oz)    Examination:  General exam: Appears calm and comfortable  Respiratory system: Clear to auscultation. Respiratory effort normal. Cardiovascular system: S1 & S2 heard Gastrointestinal system: Abdomen is nondistended, soft and nontender. Periumbilical hernia, not tender.  Central nervous system: Alert and oriented. No focal neurological deficits. Extremities:  edematous lower extremities bilaterally right greater than left. Bilateral lower extremity chronic venous stasis changes.  Skin: right foot in wraps, still with significant LE edema bilaterally, HD access: R upper arm BVT AVF (01/24/16) and TDC (in use for HD)  Data Reviewed: I have personally reviewed following labs and imaging studies  CBC:  Recent Labs Lab 03/11/16 0539 03/12/16 0942 03/13/16 0508 03/13/16 1730 03/14/16 0830 03/16/16 0539  WBC 13.2* 10.3 9.8  --  10.8* 12.0*  HGB 10.4* 11.0* 10.6* 12.2* 9.2* 8.6*  HCT 31.0* 32.5* 30.9* 36.0* 27.5* 25.6*  MCV  80.7 80.2 78.4  --  79.0 78.8  PLT 318 315 358  --  332 814   Basic Metabolic Panel:  Recent Labs Lab 03/12/16 0942 03/13/16 0508 03/13/16 1730 03/14/16 0830 03/14/16 1138 03/16/16 0539  NA 133* 133* 135 131* 131* 134*  K 4.5 4.4 4.1 4.4 4.4 4.3  CL 96* 94*  --  96* 96* 97*  CO2 24 27  --  25 25 27   GLUCOSE 89 81 71 161* 166* 112*  BUN 19 23*  --  23* 23* 32*  CREATININE 5.56* 6.53*  --  5.72* 5.71* 6.16*  CALCIUM 8.2* 8.4*  --  7.7* 7.7* 8.2*  PHOS  --  3.9  --   --  3.9 3.9     Liver Function Tests:  Recent Labs Lab 03/13/16 0508 03/14/16 1138 03/16/16 0539  ALBUMIN 1.9* 1.7* 1.7*   Coagulation Profile:  Recent Labs Lab 03/10/16 0552 03/11/16 0539 03/12/16 0942 03/13/16 0508  INR 3.90 3.99 3.37 1.86   CBG:  Recent Labs Lab 03/14/16 2121 03/15/16 0750  03/15/16 1213 03/15/16 1702 03/15/16 2108  GLUCAP 119* 97 94 113* 150*   Radiology Studies: No results found.  Scheduled Meds: . [START ON 03/17/2016] darbepoetin (ARANESP) injection - DIALYSIS  60 mcg Intravenous Q Tue-HD  . doxercalciferol  2 mcg Intravenous Q M,W,F-HD  . doxycycline  100 mg Oral Q12H  . feeding supplement (PRO-STAT SUGAR FREE 64)  30 mL Oral BID  . heparin subcutaneous  5,000 Units Subcutaneous Q8H  . insulin aspart  0-5 Units Subcutaneous QHS  . insulin aspart  0-9 Units Subcutaneous TID WC  . iron polysaccharides  150 mg Oral Daily  . multivitamin  1 tablet Oral QHS  . sevelamer carbonate  800 mg Oral TID WC  . sodium chloride  1,000 mL Intravenous Once   And  . sodium chloride  1,000 mL Intravenous Once   And  . sodium chloride  1,000 mL Intravenous Once  . sodium chloride flush  3 mL Intravenous Q12H   Continuous Infusions:    LOS: 11 days   Faye Ramsay, John [facility] Pager [phone]  If 7PM-7AM, please contact night-coverage [email or web address] Password [email or web address] 03/16/2016, 12:11 PM

## 2016-03-16 NOTE — Procedures (Deleted)
4L off today, extra HD for vol in legs.  Plan short HD tomorrow morning to get back on schedule.  Pt agreeable.  R TMA healing up.  Vol excess mostly resolved.  OK for dc from renal standpoint, perhaps tomorrow after HD.    I was present at this dialysis session, have reviewed the session itself and made  appropriate changes Kelly Splinter MD [place] pager [phone]   03/16/2016, 11:25 AM

## 2016-03-16 NOTE — Procedures (Signed)
4L off today, extra HD for vol in legs.  Plan short HD tomorrow morning to get back on schedule.  Pt agreeable.  R TMA healing up.  Vol excess mostly resolved.  OK for dc from renal standpoint, perhaps tomorrow after HD.    I was present at this dialysis session, have reviewed the session itself and made  appropriate changes Kelly Splinter MD [place] pager [phone]   03/16/2016, 1:22 PM

## 2016-03-17 DIAGNOSIS — D631 Anemia in chronic kidney disease: Secondary | ICD-10-CM | POA: Diagnosis not present

## 2016-03-17 DIAGNOSIS — L03031 Cellulitis of right toe: Secondary | ICD-10-CM | POA: Diagnosis not present

## 2016-03-17 DIAGNOSIS — A419 Sepsis, unspecified organism: Secondary | ICD-10-CM | POA: Diagnosis not present

## 2016-03-17 DIAGNOSIS — M6281 Muscle weakness (generalized): Secondary | ICD-10-CM | POA: Diagnosis not present

## 2016-03-17 DIAGNOSIS — S91301D Unspecified open wound, right foot, subsequent encounter: Secondary | ICD-10-CM | POA: Diagnosis not present

## 2016-03-17 DIAGNOSIS — I82409 Acute embolism and thrombosis of unspecified deep veins of unspecified lower extremity: Secondary | ICD-10-CM | POA: Diagnosis not present

## 2016-03-17 DIAGNOSIS — R2689 Other abnormalities of gait and mobility: Secondary | ICD-10-CM | POA: Diagnosis not present

## 2016-03-17 DIAGNOSIS — Z4931 Encounter for adequacy testing for hemodialysis: Secondary | ICD-10-CM | POA: Diagnosis not present

## 2016-03-17 DIAGNOSIS — L97513 Non-pressure chronic ulcer of other part of right foot with necrosis of muscle: Secondary | ICD-10-CM | POA: Diagnosis not present

## 2016-03-17 DIAGNOSIS — R2681 Unsteadiness on feet: Secondary | ICD-10-CM | POA: Diagnosis not present

## 2016-03-17 DIAGNOSIS — L508 Other urticaria: Secondary | ICD-10-CM | POA: Insufficient documentation

## 2016-03-17 DIAGNOSIS — I7389 Other specified peripheral vascular diseases: Secondary | ICD-10-CM | POA: Diagnosis not present

## 2016-03-17 DIAGNOSIS — R6520 Severe sepsis without septic shock: Secondary | ICD-10-CM | POA: Diagnosis not present

## 2016-03-17 DIAGNOSIS — Z992 Dependence on renal dialysis: Secondary | ICD-10-CM | POA: Diagnosis not present

## 2016-03-17 DIAGNOSIS — Z9889 Other specified postprocedural states: Secondary | ICD-10-CM | POA: Diagnosis not present

## 2016-03-17 DIAGNOSIS — I120 Hypertensive chronic kidney disease with stage 5 chronic kidney disease or end stage renal disease: Secondary | ICD-10-CM | POA: Diagnosis not present

## 2016-03-17 DIAGNOSIS — E11628 Type 2 diabetes mellitus with other skin complications: Secondary | ICD-10-CM | POA: Diagnosis not present

## 2016-03-17 DIAGNOSIS — L03115 Cellulitis of right lower limb: Secondary | ICD-10-CM | POA: Diagnosis not present

## 2016-03-17 DIAGNOSIS — L0390 Cellulitis, unspecified: Secondary | ICD-10-CM | POA: Diagnosis not present

## 2016-03-17 DIAGNOSIS — N2589 Other disorders resulting from impaired renal tubular function: Secondary | ICD-10-CM | POA: Diagnosis not present

## 2016-03-17 DIAGNOSIS — E1129 Type 2 diabetes mellitus with other diabetic kidney complication: Secondary | ICD-10-CM | POA: Diagnosis not present

## 2016-03-17 DIAGNOSIS — N2581 Secondary hyperparathyroidism of renal origin: Secondary | ICD-10-CM | POA: Diagnosis not present

## 2016-03-17 DIAGNOSIS — R519 Headache, unspecified: Secondary | ICD-10-CM | POA: Insufficient documentation

## 2016-03-17 DIAGNOSIS — N186 End stage renal disease: Secondary | ICD-10-CM | POA: Diagnosis not present

## 2016-03-17 DIAGNOSIS — S98139A Complete traumatic amputation of one unspecified lesser toe, initial encounter: Secondary | ICD-10-CM | POA: Diagnosis not present

## 2016-03-17 DIAGNOSIS — Z452 Encounter for adjustment and management of vascular access device: Secondary | ICD-10-CM | POA: Diagnosis not present

## 2016-03-17 DIAGNOSIS — E1143 Type 2 diabetes mellitus with diabetic autonomic (poly)neuropathy: Secondary | ICD-10-CM | POA: Diagnosis not present

## 2016-03-17 LAB — GLUCOSE, CAPILLARY
Glucose-Capillary: 93 mg/dL (ref 65–99)
Glucose-Capillary: 94 mg/dL (ref 65–99)
Glucose-Capillary: 91 mg/dL (ref 65–99)
Glucose-Capillary: 88 mg/dL (ref 65–99)

## 2016-03-17 LAB — CBC
HCT: 27.5 % — ABNORMAL LOW (ref 39.0–52.0)
Hemoglobin: 9.2 g/dL — ABNORMAL LOW (ref 13.0–17.0)
MCH: 26.9 pg (ref 26.0–34.0)
MCHC: 33.5 g/dL (ref 30.0–36.0)
MCV: 80.4 fL (ref 78.0–100.0)
Platelets: 340 10*3/uL (ref 150–400)
RBC: 3.42 MIL/uL — ABNORMAL LOW (ref 4.22–5.81)
RDW: 17.5 % — ABNORMAL HIGH (ref 11.5–15.5)
WBC: 8.2 10*3/uL (ref 4.0–10.5)

## 2016-03-17 MED ORDER — SODIUM CHLORIDE 0.9 % IV SOLN: 100.0000 mL | INTRAVENOUS | Status: DC | PRN

## 2016-03-17 MED ORDER — ALTEPLASE 2 MG IJ SOLR: 2.0000 mg | Freq: Once | INTRAMUSCULAR | Status: DC | PRN

## 2016-03-17 MED ORDER — INSULIN GLARGINE 100 UNIT/ML ~~LOC~~ SOLN: 15.0000 [IU] | Freq: Every day | SUBCUTANEOUS | 11 refills | Status: DC

## 2016-03-17 MED ORDER — HEPARIN SODIUM (PORCINE) 1000 UNIT/ML DIALYSIS: 1000.0000 [IU] | INTRAMUSCULAR | Status: DC | PRN

## 2016-03-17 MED ORDER — LIDOCAINE HCL (PF) 1 % IJ SOLN: 5.0000 mL | INTRAMUSCULAR | Status: DC | PRN

## 2016-03-17 MED ORDER — DOXYCYCLINE HYCLATE 100 MG PO TABS: 100.0000 mg | ORAL_TABLET | Freq: Two times a day (BID) | ORAL | 0 refills | Status: DC

## 2016-03-17 MED ORDER — PENTAFLUOROPROP-TETRAFLUOROETH EX AERO: 1.0000 "application " | INHALATION_SPRAY | CUTANEOUS | Status: DC | PRN

## 2016-03-17 MED ORDER — OXYCODONE HCL 5 MG PO TABS: 5.0000 mg | ORAL_TABLET | Freq: Four times a day (QID) | ORAL | 0 refills | Status: DC | PRN

## 2016-03-17 MED ORDER — LIDOCAINE-PRILOCAINE 2.5-2.5 % EX CREA: 1.0000 "application " | TOPICAL_CREAM | CUTANEOUS | Status: DC | PRN

## 2016-03-17 NOTE — Clinical Social Work Note (Signed)
Clinical Social Work Assessment  Patient Details  Name: [name] MRN: [id number] Date of Birth: September 05, 1950  Date of referral:  03/17/16               Reason for consult:  Facility Placement                Permission sought to share information with:  Family Supports Permission granted to share information::  Yes, Verbal Permission Granted  Name::     Rusty Aus  Agency::     Relationship::  Son  Contact Information:  [phone] or [phone]   Housing/Transportation Living arrangements for the past 2 months:  Apartment Source of Information:  Patient Patient Interpreter Needed:  None Criminal Activity/Legal Involvement Pertinent to Current Situation/Hospitalization:  No - Comment as needed Significant Relationships:  Adult Children Lives with:  Self Do you feel safe going back to the place where you live?  No (Patient in agreement with rehab before returning home to maximize safety) Need for family participation in patient care:  No (Coment)  Care giving concerns:  Patient understands that rehab will benefit him and increase his safety before going home.   Social Worker assessment / plan:  CSW talked with patient at bedside regarding d/c planning and recommendation of ST rehab. Patient is aware and wants [facility] as his niece works there.  Employment status:  Disabled (Comment on whether or not currently receiving Disability) Insurance information:  Medicare PT Recommendations:  [place] / Referral to community resources:  Other (Comment Required) (Patient had facility preference)  Patient/Family's Response to care:  No concerns expressed by patient of care during hospitalization.  Patient/Family's Understanding of and Emotional Response to Diagnosis, Current Treatment, and Prognosis:  Not discussed.  Emotional Assessment Appearance:  Appears older than stated age Attitude/Demeanor/Rapport:  Other (Appropriate) Affect  (typically observed):  Appropriate Orientation:  Oriented to Self, Oriented to Place, Oriented to  Time, Oriented to Situation Alcohol / Substance use:  Never Used Psych involvement (Current and /or in the community):  No (Comment)  Discharge Needs  Concerns to be addressed:  Discharge Planning Concerns Readmission within the last 30 days:  No Current discharge risk:  None Barriers to Discharge:  No Barriers Identified   Sable Feil, LCSW 03/17/2016, 6:13 PM

## 2016-03-17 NOTE — NC FL2 (Signed)
[place] LEVEL OF CARE SCREENING TOOL     IDENTIFICATION  Patient Name: [name] Birthdate: [birth year] Sex: male Admission Date (Current Location): 03/05/2016  [facility] and [place] Number:  [employer] and Address:  [facility] [place], [place] [address], [place], [place] [address]      Provider Number: [id number]  Attending Physician Name and Address:  Theodis Blaze, MD  Relative Name and Phone Number:  [name] (son) - [phone]    Current Level of Care: Hospital Recommended Level of Care: [place] Prior Approval Number:    Date Approved/Denied:   PASRR Number: [id number]  Discharge Plan: SNF    Current Diagnoses: Patient Active Problem List   Diagnosis Date Noted  . Cellulitis of right lower extremity   . Severe sepsis ([place]) 03/05/2016  . Cellulitis of right foot 03/05/2016  . Sepsis ([place]) 03/05/2016  . Skin cancer of scalp 11/22/2015  . History of colonic polyps   . Benign neoplasm of transverse colon   . Hyperglycemia 08/04/2013  . Uremia 08/04/2013  . Chronic kidney disease (CKD), stage IV (severe) ([place]) 03/21/2013  . ESRD (end stage renal disease) ([place]) 01/31/2013  . Chest pain with low risk for cardiac etiology 12/27/2012  . Chronic anticoagulation 12/27/2012  . 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 Vitreous hemorrhage ([place]) 04/19/2012  . CKD (chronic kidney disease) stage 4, GFR 15-29 ml/min (HCC) 03/18/2012  . NSVT (nonsustained ventricular tachycardia) ([place]) 03/18/2012  . Noncompliance 03/16/2012  . Chest pain, no MI, negative myoview, most likely muscular sketal pain 03/15/2012  . Proliferative diabetic retinopathy associated with type 2 diabetes mellitus ([place]) 08/18/2011  . 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 History of cardiac catheterization   . Non Hodgkin's lymphoma ([place])   . Hyperlipidemia   . Diabetic retinopathy ([place])   . Diabetic nephropathy ([place])   .  DVT (deep venous thrombosis) ([place])   . Cancer ([place])   . Acidosis, metabolic 72/53/6644  . PNA (pneumonia) 01/15/2011  . Renal failure, unspecified 01/12/2011  . Hypokalemia 01/12/2011  . Anemia in chronic kidney disease 01/12/2011  . Shortness of breath dyspnea 01/12/2011  . Warfarin-induced coagulopathy ([place]) 01/12/2011  . History of DVT of lower extremity 01/12/2011  . Hypertension 01/12/2011  . LYMPHOMA 10/24/2008  . Diabetes mellitus ([place]) 10/24/2008  . ABDOMINAL PAIN -GENERALIZED 10/24/2008  . PERSONAL HX COLONIC POLYPS 10/24/2008    Orientation RESPIRATION BLADDER Height & Weight     Self, Time, Situation, Place  Normal Continent Weight: 239 lb 6.7 oz (108.6 kg) Height:  6\' 3"  (190.5 cm)  BEHAVIORAL SYMPTOMS/MOOD NEUROLOGICAL BOWEL NUTRITION STATUS      Continent Diet (Renal Diet with Fluid Restrictions)  AMBULATORY STATUS COMMUNICATION OF NEEDS Skin   Extensive Assist Verbally Other (Comment) (Skin: Dry/Flaky; Surgical incisions right foot)                       Personal Care Assistance Level of Assistance  Bathing, Feeding, Dressing Bathing Assistance: Maximum assistance Feeding assistance: Maximum assistance Dressing Assistance: Maximum assistance     Functional Limitations Info  Sight, Hearing, Speech Sight Info: Adequate Hearing Info: Adequate Speech Info: Adequate    SPECIAL CARE FACTORS FREQUENCY  PT (By licensed PT)     PT Frequency: PT evaluation completed on 03/15/2016 - Recommendation of minimum of 3x/week              Contractures Contractures Info: Not present  Additional Factors Info  Code Status, Allergies, Insulin Sliding Scale Code Status Info: Full Code Allergies Info: NKDA   Insulin Sliding Scale Info: Insulin Aspart 0-5 Units Subcutaneus daily at bedtime; Insulin Aspart 0-9 [employer] Subcutaneus 3 times daily with meals       Current Medications (03/17/2016):  This is the current hospital active medication list Current  Facility-Administered Medications  Medication Dose Route Frequency Provider Last Rate Last Dose  . 0.9 %  sodium chloride infusion  100 mL Intravenous PRN Roney Jaffe, MD      . 0.9 %  sodium chloride infusion  100 mL Intravenous PRN Roney Jaffe, MD      . acetaminophen (TYLENOL) tablet 650 mg  650 mg Oral Q6H PRN Kathie Dike, MD   650 mg at 03/15/16 1303   Or  . acetaminophen (TYLENOL) suppository 650 mg  650 mg Rectal Q6H PRN Kathie Dike, MD      . alteplase (CATHFLO ACTIVASE) injection 2 mg  2 mg Intracatheter Once PRN Roney Jaffe, MD      . chlorproMAZINE (THORAZINE) injection 25 mg  25 mg Intramuscular TID PRN Theodis Blaze, MD   25 mg at 03/13/16 0953  . Darbepoetin Alfa (ARANESP) injection 60 mcg  60 mcg Intravenous Q Tue-HD Loren Racer, PA-C      . doxercalciferol (HECTOROL) injection 2 mcg  2 mcg Intravenous Q M,W,F-HD Loren Racer, PA-C   2 mcg at 03/13/16 0932  . doxycycline (VIBRA-TABS) tablet 100 mg  100 mg Oral Q12H Theodis Blaze, MD   100 mg at 03/16/16 2144  . feeding supplement (PRO-STAT SUGAR FREE 64) liquid 30 mL  30 mL Oral BID Valentina Gu, NP   30 mL at 03/16/16 2144  . heparin injection 1,000 Units  1,000 Units Dialysis PRN Roney Jaffe, MD      . heparin injection 5,000 Units  5,000 Units Subcutaneous Q8H Theodis Blaze, MD   5,000 Units at 03/17/16 [phone]  . HYDROmorphone (DILAUDID) injection 0.25-0.5 mg  0.25-0.5 mg Intravenous Q5 min PRN Myrtie Soman, MD   0.5 mg at 03/14/16 2201  . insulin aspart (novoLOG) injection 0-5 Units  0-5 Units Subcutaneous QHS Kathie Dike, MD      . insulin aspart (novoLOG) injection 0-9 Units  0-9 Units Subcutaneous TID WC Kathie Dike, MD   1 Units at 03/14/16 1700  . iron polysaccharides (NIFEREX) capsule 150 mg  150 mg Oral Daily Kathie Dike, MD   150 mg at 03/16/16 1206  . lidocaine (PF) (XYLOCAINE) 1 % injection 5 mL  5 mL Intradermal PRN Roney Jaffe, MD      . lidocaine-prilocaine (EMLA) cream  1 application  1 application Topical PRN Roney Jaffe, MD      . multivitamin (RENA-VIT) tablet 1 tablet  1 tablet Oral QHS Kathie Dike, MD   1 tablet at 03/16/16 2144  . ondansetron (ZOFRAN) tablet 4 mg  4 mg Oral Q6H PRN Kathie Dike, MD   4 mg at 03/16/16 0153   Or  . ondansetron (ZOFRAN) injection 4 mg  4 mg Intravenous Q6H PRN Kathie Dike, MD   4 mg at 03/10/16 0307  . oxyCODONE (Oxy IR/ROXICODONE) immediate release tablet 5 mg  5 mg Oral Q6H PRN Kathie Dike, MD   5 mg at 03/16/16 0410  . pentafluoroprop-tetrafluoroeth (GEBAUERS) aerosol 1 application  1 application Topical PRN Roney Jaffe, MD      . sevelamer carbonate (RENVELA) tablet 800 mg  800  mg Oral TID WC Fran Lowes, MD   800 mg at 03/16/16 1825  . sodium chloride 0.9 % bolus 1,000 mL  1,000 mL Intravenous Once Kathie Dike, MD       And  . sodium chloride 0.9 % bolus 1,000 mL  1,000 mL Intravenous Once Kathie Dike, MD       And  . sodium chloride 0.9 % bolus 1,000 mL  1,000 mL Intravenous Once Kathie Dike, MD      . sodium chloride flush (NS) 0.9 % injection 3 mL  3 mL Intravenous Q12H Kathie Dike, MD   3 mL at 03/16/16 2145     Discharge Medications: Please see discharge summary for a list of discharge medications.  Relevant Imaging Results:  Relevant Lab Results:   Additional Information SSN:  [id number]; patient has a cam boot right foot; patient has dialysis at [facility] Tuesday, Thurday, and Saturday  Linward [place], [place] Work [phone]

## 2016-03-17 NOTE — Progress Notes (Signed)
PT Cancellation Note  Patient Details Name: [name] MRN: [id number] DOB: June 14, 1950   Cancelled Treatment:    Reason Eval/Treat Not Completed: Patient at procedure or test/unavailable   Currently in HD;  Will follow up later today as time allows;  Otherwise, will follow up for PT tomorrow;   Continue to recommend SNF for post-acute rehab to maximize independence and safety with mobility prior to dc home alone.  Thank you,  Roney Marion, [place] Pager [phone] Office [phone]     Roney Marion [facility] 03/17/2016, 8:49 AM

## 2016-03-17 NOTE — Discharge Summary (Signed)
Physician Discharge Summary  [name] XBJ:[id number] DOB: [birth year] DOA: 03/05/2016  PCP: Geoffery Lyons, MD  Admit date: 03/05/2016 Discharge date: 03/17/2016  Recommendations for Outpatient Follow-up:  1. Pt will need to follow up with PCP in 1-2 weeks post discharge 2. Please also check CBC to evaluate Hg and Hct levels 3. Please complete therapy with doxycycline as outlined below 4. Please also call office of Dr. Daylene Katayama outlined below to schedule follow up appointment for pt   Discharge Diagnoses:  Active Problems:   Diabetes mellitus ([place])   Anemia in chronic kidney disease  Discharge Condition: Stable  Diet recommendation: Renal diet  Brief Narrative:  Please see dictated H&P dated 03/05/2016 for details. 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 Patient had been followed closely by Dr. Paulla Dolly of [place] for chronic right foot wound.  Thus far during this hospital course, patient was continued on broad-spectrum antibiotics (vancomycin and Zosyn). MRI was obtained with no evidence of deeper infection including abscess or osteomyelitis. Patient's wounds continue to drain and general surgery was consulted. CT of the right leg was obtained and case was discussed with vascular surgery who felt patient was appropriate for amputation if indicated. Discussed case with general surgery who recommended discussing with patient's primary podiatrist. Discussed case with podiatry who has since recommended transfer to [facility] for formal evaluation and possible surgery. Transfer orders has  since been placed.  Assessment & Plan:  1. Right foot cellulitis and wet gangrene with sepsis present on admission 1. Patient completed vancomycin and Zosyn for 11 days, transitioned to oral ABX doxycycline 10/15, must complete therapy upon discharge  2. 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 WBC is now WNL 4. ABI done and results reviewed, Right great toe pressure could not be obtained due to open wound.  5. Left TBI of 0.68 is borderline abnormal at rest. 6. This will required close monitoring upon discharge  7. Per Dr. Amalia Hailey pt can be discharged from his stand point  2. End-stage renal disease 1. Patient normally dialyzes on Tuesdays Thursdays and Saturdays prior to hospital admission 2. Patient has continued on a Monday Wednesday Friday schedule since admission 3. Appreciate nephrology team input  4. Pt still with lots of edema 5. Weight trend: 114 --> 110 kg on 03/14/2016  3. Hypertension, essential  1. Blood pressure currently stable 4. Prior history of DVT 1. Chart reviewed 2. Patient has a history of lower extremity DVT and had been on Coumadin 3. Per records, IVC filter was placed in May 2014 as anticoagulation needed to be discontinued for planned lumbar surgery 4. Patient denies family history of clots. Patient also denies more than 1 thromboembolic episode in his lifetime 5. Question patient's need for further Coumadin therapy. Discussed this with vascular surgery who recommended lower extremity Dopplers.  6. No evidence of DVT on doppler, discontinued therapeutic AC 10/12 5. Chronic anticoagulation 1. Per above, patient had previously remained on therapeutic Coumadin for the past several years 2. Discontinued as noted above  6. Diabetes mellitus with complications of nephropathy  1. We'll  continue lanuts but dose changed to 15 U QHS, please note the readjust the dose as clinically  indicated  7. Anemia, suspected secondary to chronic renal disease 1. Follow CBC, no signs of bleeding, Hg stable ~ 9 - 10 8.         Obesity 1. Body mass index is 31.52 kg/m  DVT prophylaxis: Hep SQ Code Status: Full code Family Communication: Patient in room, niece over the phone Disposition Plan: SNF, [facility] in [place]:   Vascular surgery  General surgery  Podiatry - Dr. Amalia Hailey  Nephrology  Procedures:   None  Antimicrobials:  Vancomycin 10/05 --> 10/15  Zosyn 10/05 --> 10/15  Doxycycline 10/15 -->  Procedures/Studies: Ct Angio Ao+bifem W &/or Wo Contrast  Result Date: 03/10/2016 CLINICAL DATA:  65 year old with right leg and foot swelling. History of melanoma, peripheral vascular disease and hemodialysis. History of non-Hodgkin's lymphoma. EXAM: CT ANGIOGRAPHY OF ABDOMINAL AORTA WITH ILIOFEMORAL RUNOFF TECHNIQUE: Multidetector CT imaging of the abdomen, pelvis and lower extremities was performed using the standard protocol during bolus administration of intravenous contrast. Multiplanar CT image reconstructions and MIPs were obtained to evaluate the vascular anatomy. CONTRAST:  150 mL Isovue 370 COMPARISON:  PET-CT dated 03/22/2015 FINDINGS: VASCULAR Aorta: Normal caliber of the abdominal aorta without dissection. Mild atherosclerotic disease in the abdominal aorta. Celiac: Patent without evidence of aneurysm, dissection, vasculitis or significant stenosis. SMA: Patent without evidence of aneurysm, dissection, vasculitis or significant stenosis. Renals: Bilateral renal arteries are patent. No significant renal artery stenosis. IMA: Patent with calcified plaque. RIGHT Lower Extremity Inflow: Right iliac arteries have calcified plaque without significant stenosis. Outflow: Right common femoral artery is widely patent with mild plaque. Right profunda femoral arteries are patent. Diffuse atherosclerotic disease in the right SFA without critical stenosis.  Diffuse atherosclerotic disease in the right popliteal artery without critical stenosis. Runoff: Runoff vessels are diffusely calcified. There is 3 vessel runoff in the right lower extremity with a short-segment occlusion in the mid anterior tibial artery with distal reconstitution. There is flow at the posterior tibial artery and dorsalis pedis artery at the ankle. LEFT Lower Extremity Inflow: Atherosclerotic calcifications in the left iliac arteries without significant stenosis. Outflow: Left common femoral artery is widely patent. Left profunda femoral arteries are patent. Diffuse atherosclerotic disease in the left SFA without critical stenosis. Mild narrowing in the mid left popliteal artery. Runoff: Runoff vessels are heavily calcified. There appears to be three-vessel runoff in the left lower extremity. Appears to be flow at the dorsalis pedis artery and posterior tibial artery at the ankle. Veins: IVC filter positioned below the renal veins. Cannot evaluate for IVC patency on this arterial phase of imaging. Review of the MIP images confirms the above findings. NON-VASCULAR Lower chest: Small pleural effusions, right side greater than left. Mild edema or atelectasis at both lung bases. Central venous catheter extending into the right atrium. Hepatobiliary: Limited evaluation on this arterial phase imaging. There is large gallstone measuring up the 3.3 cm. Difficult to evaluate for any gallbladder wall thickening or inflammation due to large amount of perihepatic ascites. Pancreas: Limited evaluation but no gross abnormality. Spleen: Normal appearance of spleen without enlargement. Adrenals/Urinary Tract: No gross abnormality to the left adrenal gland. Right adrenal gland is poorly visualized. No evidence for hydronephrosis. Urinary bladder is decompressed. Stomach/Bowel: No gross abnormality to the stomach, small or large bowel. No evidence for bowel obstruction. Lymphatic: Limited evaluation for abdominal or  pelvic lymphadenopathy. There appears to be small  lymph nodes in the left periaortic region. There are markedly enlarged lymph nodes in the inguinal regions, right side greater than left. These large inguinal lymph nodes may be reactive. Index right inguinal lymph node measures 2.4 cm in short axis on sequence 5, 184, previously measured 1.9 cm in the short axis. Reproductive: No gross abnormality to the prostate. Other: Subcutaneous edema in both lower extremities, right side greater than left. Soft tissue calcifications in the posterior thigh musculature bilaterally. Large amount of ascites throughout the abdomen and pelvis. The ascites has clearly progressed since 03/22/2015. Musculoskeletal: Cortical thickening in the proximal right fibula probably related to an old injury. There is a fracture involving the distal left fibula. Callus formation along the posterior aspect of this fracture suggesting a subacute injury or incomplete healing. Significant disc and endplate disease at Q6-V7. IMPRESSION: VASCULAR Calcified atherosclerotic disease throughout the aorta and lower extremities bilaterally. There is no significant inflow or outflow disease. Evaluation of the runoff vessels is limited due to the heavily calcified vessels but there is flow to both ankles. There is a short-segment occlusion of the right anterior tibial artery. IVC filter is present. NON-VASCULAR Large volume ascites. Findings could be related to underlying liver disease but the liver is poorly characterized on this examination. Cholelithiasis. Small pleural effusions with dependent pulmonary edema or atelectasis. Diffuse subcutaneous edema throughout the lower extremities, right side greater than left. Enlarged inguinal lymph nodes are probably reactive in etiology. Recommend monitoring this lymphadenopathy based on the history of lymphoma. Age-indeterminate fracture in the distal left fibula. This fracture has lucency with a small amount of  callus formation. This could represent a subacute injury versus a nonunion. Electronically Signed   By: Markus Daft [name]   On: 03/10/2016 12:53   Mr Foot Right Wo Contrast  Result Date: 03/06/2016 CLINICAL DATA:  Patient admitted to the hospital 03/04/2016 for fever and hypotension. Chronic wound on the right foot involving multiple toes. Dialysis patient. EXAM: MRI OF THE RIGHT FOREFOOT WITHOUT CONTRAST TECHNIQUE: Multiplanar, multisequence MR imaging was performed. No intravenous contrast was administered. COMPARISON:  Plain films of the right foot 03/05/2016. FINDINGS: The study is somewhat degraded by patient motion. Subcutaneous edema is present about the foot. A skin ulceration is seen along the distal aspect of the great toe on the medial side. At the level the distal phalanx, the ulcer extends almost to bone. No marrow edema in the great toe is identified. The patient appears to have a transverse fracture through the middle phalanx of the third toe which is nondisplaced. There is very mild marrow edema in the middle phalanx. No acute fracture is identified. First MTP osteoarthritis is seen. There is no focal fluid collection or mass. Intrinsic musculature the foot is atrophied. IMPRESSION: Large and deep ulceration along the medial aspect of the distal great toe without underlying abscess or evidence of osteomyelitis. Subcutaneous edema about the foot could be due to cellulitis or dependent change. Subacute to remote appearing nondisplaced fracture middle phalanx of the third toe. First tarsometatarsal and first MTP joint osteoarthritis. Remote fracture base of the second metatarsal. Electronically Signed   By: Inge Rise [name]   On: 03/06/2016 07:35   US Venous Img Lower Bilateral  Result Date: 03/11/2016 CLINICAL DATA:  Right lower extremity pain and edema. History of end-stage renal disease. Patient has an IVC filter. EXAM: BILATERAL LOWER EXTREMITY VENOUS DOPPLER ULTRASOUND TECHNIQUE:  Gray-scale sonography with graded compression, as well as color Doppler and duplex ultrasound were  performed to evaluate the lower extremity deep venous systems from the level of the common femoral vein and including the common femoral, femoral, profunda femoral, popliteal and calf veins including the posterior tibial, peroneal and gastrocnemius veins when visible. The superficial great saphenous vein was also interrogated. Spectral Doppler was utilized to evaluate flow at rest and with distal augmentation maneuvers in the common femoral, femoral and popliteal veins. COMPARISON:  PET-CT 03/22/2015 FINDINGS: RIGHT LOWER EXTREMITY Common Femoral Vein: No evidence of thrombus. Normal compressibility, respiratory phasicity and color Doppler flow. Saphenofemoral Junction: No evidence of thrombus. Normal compressibility and flow on color Doppler imaging. Profunda Femoral Vein: No evidence of thrombus. Normal compressibility and flow on color Doppler imaging. Femoral Vein: No evidence of thrombus. Normal compressibility, respiratory phasicity and response to augmentation. Popliteal Vein: No evidence of thrombus. Normal compressibility, respiratory phasicity and response to augmentation. Calf Veins: Not visualized due to body habitus and edema. Other Findings: Enlarged right inguinal lymph node measuring 1.9 cm in the short axis. This was present on the previous PET-CT. LEFT LOWER EXTREMITY Common Femoral Vein: No evidence of thrombus. Normal compressibility, respiratory phasicity and color Doppler flow. Saphenofemoral Junction: No evidence of thrombus. Normal compressibility and flow on color Doppler imaging. Profunda Femoral Vein: No evidence of thrombus. Normal compressibility and flow on color Doppler imaging. Femoral Vein: No evidence of thrombus. Normal compressibility, respiratory phasicity and response to augmentation. Popliteal Vein: No evidence of thrombus. Normal compressibility, respiratory phasicity and  response to augmentation. Calf Veins: Not visualized. Other Findings: Prominent left inguinal lymph node measuring 0.9 cm in the short axis. IMPRESSION: No evidence of deep venous thrombosis. The deep calf veins could not be visualized due to the edema. Prominent bilateral inguinal lymph nodes are similar to the previous PET-CT. Electronically Signed   By: Markus Daft [name]   On: 03/11/2016 16:40   US Arterial Seg Single  Result Date: 03/09/2016 CLINICAL DATA:  Right for cellulitis. Draining toe wound. End-stage renal disease on hemodialysis. Hypertension, diabetes. EXAM: NONINVASIVE PHYSIOLOGIC VASCULAR STUDY OF BILATERAL LOWER EXTREMITIES TECHNIQUE: Evaluation of both lower extremities were performed at rest, including calculation of ankle-brachial indices with single level Doppler, pressure recording. COMPARISON:  None. FINDINGS: Right ABI:  Non calculable due to vascular noncompressibility Left ABI:  0.78 Right Lower Extremity:  Biphasic waveforms distally. Left Lower Extremity:  Biphasic waveforms distally. IMPRESSION: 1. Bilateral lower extremity arterial occlusive disease, at least moderate in severity on the left, indeterminate severity on the right due to vascular noncompressibility. Should the patient fail conservative treatment, consider CTA runoff (higher spatial resolution) or MRA runoff (no radiation risk, can be performed noncontrast in the setting of renal dysfunction) to better define the site and nature of arterial occlusive disease and delineate treatment options. Electronically Signed   By: Lucrezia Europe [name]   On: 03/09/2016 13:39   Dg Chest Portable 1 View  Result Date: 03/05/2016 CLINICAL DATA:  Fever and generalized weakness.  Dialysis patient. EXAM: PORTABLE CHEST 1 VIEW COMPARISON:  Chest radiograph 06/09/2013. FINDINGS: The heart is enlarged. Dialysis catheter tip lies in the RIGHT atrium. Moderate vascular congestion without overt failure. Asymmetric opacity at the RIGHT base could  represent early infiltrate or subsegmental atelectasis. No effusion or pneumothorax. Degenerative change RIGHT shoulder. IMPRESSION: Asymmetric opacity at the RIGHT base could represent early infiltrate or atelectasis. PA and lateral chest recommended when stable. Cardiomegaly with moderate vascular congestion. Electronically Signed   By: Staci Righter [name]   On: 03/05/2016 07:59  Dg Chest Port 1v Same Day  Result Date: 03/05/2016 CLINICAL DATA:  65 year old male status post central line placement. Initial encounter. EXAM: PORTABLE CHEST 1 VIEW COMPARISON:  0742 hours today and earlier. FINDINGS: Right IJ approach triple lumen appearing central line courses to the lower mediastinum at the cavoatrial junction level. Preexisting left IJ dual-lumen dialysis type catheter is unchanged. No pneumothorax. Stable lung volumes. Stable ventilation. Stable cardiomegaly and mediastinal contours. Calcified aortic atherosclerosis. Visualized tracheal air column is within normal limits. IMPRESSION: 1. Right IJ central line placed, tip at the cavoatrial junction level with no adverse features. 2. Otherwise stable chest. Electronically Signed   By: Genevie Ann [name]   On: 03/05/2016 13:49   Dg Foot 2 Views Right  Result Date: 03/05/2016 CLINICAL DATA:  Fever and generalized weakness with oozing foot wounds EXAM: RIGHT FOOT - 2 VIEW COMPARISON:  02/12/2016 FINDINGS: Generalized osteopenia is again identified. Diffuse vascular calcifications are seen. No definitive bony erosive changes to suggest osteomyelitis are noted. Degenerative changes in the tarsal bones are again seen. Plantar calcaneal spurs are noted. IMPRESSION: Chronic changes without acute abnormality. Electronically Signed   By: Inez Catalina [name]   On: 03/05/2016 07:56   Dg Foot Complete Right  Result Date: 03/13/2016 CLINICAL DATA:  Postop EXAM: RIGHT FOOT COMPLETE - 3+ VIEW COMPARISON:  MRI 03/05/2016, radiograph 03/05/2016 FINDINGS: There is interim  amputation of the distal foot at the level of the mid 1 through 5 metatarsals. Cutaneous staples are in place. Cut edges of the bone are sharp. No periostitis or erosive change. Small pockets of soft tissue gas posterior to the distal tibia and fibula. Vascular calcifications are present. Moderate calcaneal spur. IMPRESSION: Interim amputation of the distal digits of the right foot at the level of the mid first through fifth mid metatarsals with expected postsurgical changes. Vascular calcification. Electronically Signed   By: Donavan Foil [name]   On: 03/13/2016 20:54   Discharge Exam: Vitals:   03/17/16 0900 03/17/16 1046  BP: 123/71 118/70  Pulse: 95 98  Resp:  18  Temp:  98.6 F (37 C)   Vitals:   03/17/16 0800 03/17/16 0830 03/17/16 0900 03/17/16 1046  BP: 124/79 125/75 123/71 118/70  Pulse: 99 97 95 98  Resp:    18  Temp:    98.6 F (37 C)  TempSrc:    Oral  SpO2:    98%  Weight:      Height:        General: Pt is alert, follows commands appropriately, not in acute distress Cardiovascular: Regular rate and rhythm, no rubs, no gallops Respiratory: Clear to auscultation bilaterally, no wheezing, no crackles, no rhonchi Abdominal: Soft, non tender, non distended, bowel sounds +, no guarding  Discharge Instructions  Discharge Instructions    Diet - low sodium heart healthy    Complete by:  As directed    Increase activity slowly    Complete by:  As directed        Medication List    STOP taking these medications   insulin lispro 100 UNIT/ML injection Commonly known as:  HUMALOG   oxyCODONE-acetaminophen 5-325 MG tablet Commonly known as:  PERCOCET/ROXICET   warfarin 5 MG tablet Commonly known as:  COUMADIN     TAKE these medications   acetaminophen 325 MG tablet Commonly known as:  TYLENOL Take 650 mg by mouth every 6 (six) hours as needed (pain).   amlodipine-benazepril 2.5-10 MG capsule Commonly known as:  LOTREL Take  1 capsule by mouth daily.    diclofenac sodium 1 % Gel Commonly known as:  VOLTAREN Apply topically 4 (four) times daily.   doxycycline 100 MG tablet Commonly known as:  VIBRA-TABS Take 1 tablet (100 mg total) by mouth every 12 (twelve) hours.   ergocalciferol 50000 units capsule Commonly known as:  VITAMIN D2 Take 50,000 Units by mouth once a week.   insulin glargine 100 UNIT/ML injection Commonly known as:  LANTUS Inject 0.15 mLs (15 Units total) into the skin at bedtime. What changed:  how much to take   lidocaine-prilocaine cream Commonly known as:  EMLA Apply 1 application topically as needed (Apply small amount to access site 1-2 hours before dialysis. Cover with occlusive dressing (saran wrap)).   multivitamin Tabs tablet Take 1 tablet by mouth at bedtime.   oxyCODONE 5 MG immediate release tablet Commonly known as:  Oxy IR/ROXICODONE Take 1 tablet (5 mg total) by mouth every 6 (six) hours as needed for moderate pain.   polysaccharide iron 150 MG capsule Generic drug:  iron polysaccharides Take 1 capsule (150 mg total) by mouth daily.   sevelamer carbonate 800 MG tablet Commonly known as:  RENVELA Take 1,600 mg by mouth 3 (three) times daily with meals.      Follow-up Information    [facility] .   Contact information: [place] [address] [phone]        Geoffery Lyons, MD .   Specialty:  Internal Medicine Contact information: [place] [address] [place], DPM. Schedule an appointment as soon as possible for a visit today.   Specialty:  Podiatry Contact information: [place] [address] [phone]            The results of significant diagnostics from this hospitalization (including imaging, microbiology, ancillary and laboratory) are listed below for reference.     Microbiology: No results found for this or any previous visit (from the past 240  hour(s)).   Labs: Basic Metabolic Panel:  Recent Labs Lab 03/12/16 0942 03/13/16 0508 03/13/16 1730 03/14/16 0830 03/14/16 1138 03/16/16 0539  NA 133* 133* 135 131* 131* 134*  K 4.5 4.4 4.1 4.4 4.4 4.3  CL 96* 94*  --  96* 96* 97*  CO2 24 27  --  25 25 27   GLUCOSE 89 81 71 161* 166* 112*  BUN 19 23*  --  23* 23* 32*  CREATININE 5.56* 6.53*  --  5.72* 5.71* 6.16*  CALCIUM 8.2* 8.4*  --  7.7* 7.7* 8.2*  PHOS  --  3.9  --   --  3.9 3.9   Liver Function Tests:  Recent Labs Lab 03/13/16 0508 03/14/16 1138 03/16/16 0539  ALBUMIN 1.9* 1.7* 1.7*   CBC:  Recent Labs Lab 03/12/16 0942 03/13/16 0508 03/13/16 1730 03/14/16 0830 03/16/16 0539 03/17/16 0401  WBC 10.3 9.8  --  10.8* 12.0* 8.2  HGB 11.0* 10.6* 12.2* 9.2* 8.6* 9.2*  HCT 32.5* 30.9* 36.0* 27.5* 25.6* 27.5*  MCV 80.2 78.4  --  79.0 78.8 80.4  PLT 315 358  --  332 340 340   CBG:  Recent Labs Lab 03/15/16 1702 03/15/16 2108 03/16/16 1228 03/16/16 1647 03/16/16 2128  GLUCAP 113* 150* 83 91 93   SIGNED: Time coordinating discharge: 30 minutes  MAGICK-MYERS, ISKRA, MD  [facility] 03/17/2016, 10:54 AM Pager [phone]  If 7PM-7AM, please contact night-coverage  [email or web address] Password [email or web address]

## 2016-03-17 NOTE — Care Management Important Message (Signed)
Important Message  Patient Details  Name: [name] MRN: [id number] Date of Birth: Apr 25, 1951   Medicare Important Message Given:  Yes    Evie Croston 03/17/2016, 11:11 AM

## 2016-03-17 NOTE — Progress Notes (Signed)
Report called to Nurse at [facility] in [place] for patient to be transferred this evening. Pt is bathed, vs are stable and his IV has been removed. Pt has eaten his meal tray and family member dressed him in clothes. IV removed intact and dry. Transport notified via Education officer, [employer] van[employer].

## 2016-03-17 NOTE — Clinical Social Work Placement (Signed)
   CLINICAL SOCIAL WORK PLACEMENT  NOTE 03/17/16 - DISCHARGED TO [facility]  Date:  03/17/2016  Patient Details  Name: [name] MRN: [id number] Date of Birth: 03/27/1951  Clinical Social Work is seeking post-discharge placement for this patient at the [place] level of care (*CSW will initial, date and re-position this form in  chart as items are completed):  No (Patient has preference)   Patient/family provided with [facility] Work Department's list of facilities offering this level of care within the geographic area requested by the patient (or if unable, by the patient's family).  Yes   Patient/family informed of their freedom to choose among providers that offer the needed level of care, that participate in Medicare, Medicaid or managed care program needed by the patient, have an available bed and are willing to accept the patient.  Yes   Patient/family informed of [facility] ownership interest in [facility] and [facility], as well as of the fact that they are under no obligation to receive care at these facilities.  PASRR submitted to EDS on       PASRR number received on       Existing PASRR number confirmed on 03/17/16     FL2 transmitted to all facilities in geographic area requested by pt/family on       FL2 transmitted to all facilities within larger geographic area on       Patient informed that his/her managed care company has contracts with or will negotiate with certain facilities, including the following:        Yes   Patient/family informed of bed offers received.  Patient chooses bed at [facility]     Physician recommends and patient chooses bed at      Patient to be transferred to [facility] on 03/17/16.  Patient to be transferred to facility by Ambulance     Patient family notified on 03/17/16 of transfer.  Name of family member notified:  Son [name]     PHYSICIAN        Additional Comment:    _______________________________________________ Sable Feil, LCSW 03/17/2016, 6:17 PM

## 2016-03-17 NOTE — Progress Notes (Signed)
Patient transported by [facility] to [facility] in [place]. Belongings with patient. [name], [name], Therapist, sports

## 2016-03-17 NOTE — Progress Notes (Signed)
[facility] Progress Note   Subjective:   No c/o  Objective Vitals:   03/17/16 0700 03/17/16 0800 03/17/16 0830 03/17/16 0900  BP: 113/76 124/79 125/75 123/71  Pulse: 96 99 97 95  Resp:      Temp:      TempSrc:      SpO2:      Weight:      Height:       Physical Exam General: Pleasant male. NAD. Heart: RRR; no murmur. Lungs: CTA anteriorly (unable to listen posteriorly due to pain with moving) Extremities: 1+ LE edema; R foot bandaged Dialysis Access: LUE AVF (01/24/16), using L chest PC for now.   Recent Labs Lab 03/13/16 0508  03/14/16 0830 03/14/16 1138 03/16/16 0539  NA 133*  < > 131* 131* 134*  K 4.4  < > 4.4 4.4 4.3  CL 94*  --  96* 96* 97*  CO2 27  --  25 25 27   GLUCOSE 81  < > 161* 166* 112*  BUN 23*  --  23* 23* 32*  CREATININE 6.53*  --  5.72* 5.71* 6.16*  CALCIUM 8.4*  --  7.7* 7.7* 8.2*  PHOS 3.9  --   --  3.9 3.9  < > = values in this interval not displayed.   Recent Labs Lab 03/13/16 0508 03/14/16 1138 03/16/16 0539  ALBUMIN 1.9* 1.7* 1.7*   CBC:  Recent Labs Lab 03/12/16 0942 03/13/16 0508  03/14/16 0830 03/16/16 0539 03/17/16 0401  WBC 10.3 9.8  --  10.8* 12.0* 8.2  HGB 11.0* 10.6*  < > 9.2* 8.6* 9.2*  HCT 32.5* 30.9*  < > 27.5* 25.6* 27.5*  MCV 80.2 78.4  --  79.0 78.8 80.4  PLT 315 358  --  332 340 340  < > = values in this interval not displayed. CBG:  Recent Labs Lab 03/15/16 1702 03/15/16 2108 03/16/16 1228 03/16/16 1647 03/16/16 2128  GLUCAP 113* 150* 83 91 93   Studies/Results: No results found. Medications:   . darbepoetin (ARANESP) injection - DIALYSIS  60 mcg Intravenous Q Tue-HD  . doxercalciferol  2 mcg Intravenous Q M,W,F-HD  . doxycycline  100 mg Oral Q12H  . feeding supplement (PRO-STAT SUGAR FREE 64)  30 mL Oral BID  . heparin subcutaneous  5,000 Units Subcutaneous Q8H  . insulin aspart  0-5 Units Subcutaneous QHS  . insulin aspart  0-9 Units Subcutaneous TID WC  . iron  polysaccharides  150 mg Oral Daily  . multivitamin  1 tablet Oral QHS  . sevelamer carbonate  800 mg Oral TID WC  . sodium chloride  1,000 mL Intravenous Once   And  . sodium chloride  1,000 mL Intravenous Once   And  . sodium chloride  1,000 mL Intravenous Once  . sodium chloride flush  3 mL Intravenous Q12H    Dialysis Orders: [place] on TTS 4 hours, 35 minutes,  EDW 117.5kg Will have lower EDW  3K/2.5Ca, BFR 400, DFR 800, UF Profile 4  TDC/Maturing RUA AVF (01/24/16) - Heparin 12,000 units bolus q HD - Hectoral 2 mcg IV q HD - Mircera 23mcg IV q 2 weeks (last given 10/3) - Venofer 50mg  IV q week  Assessment: 1. R foot cellulitis/gangrene: On Vanc/Zosyn. 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 ESRD: TTS HD 3. Volume excess: resolved, lowering dry wt on dc 4. Anemia: Hgb 9.2. Last outpt Mircera was 10/3. Starting Aranesp 42mcg weekly 5. Metabolic bone disease: Continue renvela and hectorol with HD. 6.  Nutrition: Albumin 1.7. Continue high protein diet, Prostate. Says Nepro causes GI upset.  7. DM: Per primary 8. Hx DVT on warfarin: INR supra therapeutic on admission. Now off warfarin, per primary. 9. Dispo: ok for dc from renal standpoint  Plan - short HD today to get back on sched.   Kelly Splinter MD [employer] pgr [phone]   03/17/2016, 9:28 AM

## 2016-03-17 NOTE — Care Management Important Message (Signed)
Important Message  Patient Details  Name: [name] MRN: [id number] Date of Birth: 22-Aug-1950   Medicare Important Message Given:  Yes    Amauri Keefe 03/17/2016, 11:12 AM

## 2016-03-17 NOTE — Discharge Instructions (Signed)

## 2016-03-19 ENCOUNTER — Ambulatory Visit (INDEPENDENT_AMBULATORY_CARE_PROVIDER_SITE_OTHER): Payer: Medicare Other | Admitting: Podiatry

## 2016-03-19 ENCOUNTER — Ambulatory Visit (INDEPENDENT_AMBULATORY_CARE_PROVIDER_SITE_OTHER): Payer: Medicare Other

## 2016-03-19 DIAGNOSIS — Z9889 Other specified postprocedural states: Secondary | ICD-10-CM | POA: Diagnosis not present

## 2016-03-19 DIAGNOSIS — E1143 Type 2 diabetes mellitus with diabetic autonomic (poly)neuropathy: Secondary | ICD-10-CM

## 2016-03-19 DIAGNOSIS — Z89431 Acquired absence of right foot: Secondary | ICD-10-CM

## 2016-03-20 DIAGNOSIS — N186 End stage renal disease: Secondary | ICD-10-CM | POA: Diagnosis not present

## 2016-03-20 DIAGNOSIS — E11628 Type 2 diabetes mellitus with other skin complications: Secondary | ICD-10-CM | POA: Diagnosis not present

## 2016-03-20 DIAGNOSIS — I7389 Other specified peripheral vascular diseases: Secondary | ICD-10-CM | POA: Diagnosis not present

## 2016-03-20 DIAGNOSIS — L03031 Cellulitis of right toe: Secondary | ICD-10-CM | POA: Diagnosis not present

## 2016-03-20 DIAGNOSIS — D631 Anemia in chronic kidney disease: Secondary | ICD-10-CM | POA: Diagnosis not present

## 2016-03-20 DIAGNOSIS — N2581 Secondary hyperparathyroidism of renal origin: Secondary | ICD-10-CM | POA: Diagnosis not present

## 2016-03-21 NOTE — Progress Notes (Signed)
Subjective: Patient presents today 1 week postop transmetatarsal amputation to the right foot with tendo Achilles lengthening right.  Objective: Incision site well coapted with staples intact. There is intermittent areas of drainage with periwound maceration. Achilles tendon incision sites and sutures are intact.  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  Plan of care: #1 patient was evaluated #2 today orders were given for daily dressing changes consisting of iodine with dry sterile dressing #3 continue nonweightbearing in the cam boot 4 patient is to return to clinic in 2 weeks

## 2016-03-23 DIAGNOSIS — D631 Anemia in chronic kidney disease: Secondary | ICD-10-CM | POA: Diagnosis not present

## 2016-03-23 DIAGNOSIS — N2581 Secondary hyperparathyroidism of renal origin: Secondary | ICD-10-CM | POA: Diagnosis not present

## 2016-03-23 DIAGNOSIS — N186 End stage renal disease: Secondary | ICD-10-CM | POA: Diagnosis not present

## 2016-03-25 DIAGNOSIS — N186 End stage renal disease: Secondary | ICD-10-CM | POA: Diagnosis not present

## 2016-03-25 DIAGNOSIS — D631 Anemia in chronic kidney disease: Secondary | ICD-10-CM | POA: Diagnosis not present

## 2016-03-25 DIAGNOSIS — N2581 Secondary hyperparathyroidism of renal origin: Secondary | ICD-10-CM | POA: Diagnosis not present

## 2016-03-27 DIAGNOSIS — D631 Anemia in chronic kidney disease: Secondary | ICD-10-CM | POA: Diagnosis not present

## 2016-03-27 DIAGNOSIS — N186 End stage renal disease: Secondary | ICD-10-CM | POA: Diagnosis not present

## 2016-03-27 DIAGNOSIS — N2581 Secondary hyperparathyroidism of renal origin: Secondary | ICD-10-CM | POA: Diagnosis not present

## 2016-03-30 DIAGNOSIS — N186 End stage renal disease: Secondary | ICD-10-CM | POA: Diagnosis not present

## 2016-03-30 DIAGNOSIS — N2581 Secondary hyperparathyroidism of renal origin: Secondary | ICD-10-CM | POA: Diagnosis not present

## 2016-03-30 DIAGNOSIS — D631 Anemia in chronic kidney disease: Secondary | ICD-10-CM | POA: Diagnosis not present

## 2016-03-31 ENCOUNTER — Telehealth: Payer: Self-pay | Admitting: *Deleted

## 2016-03-31 DIAGNOSIS — E1129 Type 2 diabetes mellitus with other diabetic kidney complication: Secondary | ICD-10-CM | POA: Diagnosis not present

## 2016-03-31 DIAGNOSIS — N186 End stage renal disease: Secondary | ICD-10-CM | POA: Diagnosis not present

## 2016-03-31 DIAGNOSIS — Z992 Dependence on renal dialysis: Secondary | ICD-10-CM | POA: Diagnosis not present

## 2016-03-31 NOTE — Telephone Encounter (Signed)
[facility] in [place] states they have seen a change in pt's right surgery foot. Left message for [place] to call again and have me pulled from my current area to assist her with pt. Amy Burnett Harry states the area looks like it has  Dehisced with adipose tissue.  Dr. Amalia Hailey states this is most likely due to pt's lack of circulation to the area not infection, continue to paint the area with betadine and have pt keep his Thursday appt. I informed Amy Burnett Harry of Dr. Amalia Hailey orders.

## 2016-04-01 DIAGNOSIS — N2589 Other disorders resulting from impaired renal tubular function: Secondary | ICD-10-CM | POA: Diagnosis not present

## 2016-04-01 DIAGNOSIS — N2581 Secondary hyperparathyroidism of renal origin: Secondary | ICD-10-CM | POA: Diagnosis not present

## 2016-04-01 DIAGNOSIS — N186 End stage renal disease: Secondary | ICD-10-CM | POA: Diagnosis not present

## 2016-04-01 DIAGNOSIS — D631 Anemia in chronic kidney disease: Secondary | ICD-10-CM | POA: Diagnosis not present

## 2016-04-02 ENCOUNTER — Ambulatory Visit (INDEPENDENT_AMBULATORY_CARE_PROVIDER_SITE_OTHER): Payer: Medicare Other | Admitting: Podiatry

## 2016-04-02 DIAGNOSIS — E1143 Type 2 diabetes mellitus with diabetic autonomic (poly)neuropathy: Secondary | ICD-10-CM

## 2016-04-02 DIAGNOSIS — Z9889 Other specified postprocedural states: Secondary | ICD-10-CM

## 2016-04-02 DIAGNOSIS — Z89431 Acquired absence of right foot: Secondary | ICD-10-CM

## 2016-04-03 DIAGNOSIS — N186 End stage renal disease: Secondary | ICD-10-CM | POA: Diagnosis not present

## 2016-04-03 DIAGNOSIS — D631 Anemia in chronic kidney disease: Secondary | ICD-10-CM | POA: Diagnosis not present

## 2016-04-03 DIAGNOSIS — N2589 Other disorders resulting from impaired renal tubular function: Secondary | ICD-10-CM | POA: Diagnosis not present

## 2016-04-03 DIAGNOSIS — N2581 Secondary hyperparathyroidism of renal origin: Secondary | ICD-10-CM | POA: Diagnosis not present

## 2016-04-04 NOTE — Progress Notes (Signed)
Subjective: Patient presents today for second postop visit status post TMA right foot. Date of surgery was 03/13/2016.  Objective: Staples are intact. There is an extensive amount of dehiscence with ulceration noted to the lateral aspect of the transmetatarsal site. Drainage is noted. Peri-incisional area is macerated.  Assessment: Status post transmetatarsal amputation right foot. Dehiscence lateral aspect of the incision site. Ulceration with necrosis lateral aspect of the right foot stump.  Plan of care: Today dressings were changed. Patient was evaluated. Orders for daily wound care was placed. Wound care will consist of daily application of iodine followed by a dry sterile dressing. Patient is currently being managed by a vascular. Patient is to return to clinic in 1 week.

## 2016-04-06 DIAGNOSIS — N2581 Secondary hyperparathyroidism of renal origin: Secondary | ICD-10-CM | POA: Diagnosis not present

## 2016-04-06 DIAGNOSIS — N2589 Other disorders resulting from impaired renal tubular function: Secondary | ICD-10-CM | POA: Insufficient documentation

## 2016-04-06 DIAGNOSIS — N186 End stage renal disease: Secondary | ICD-10-CM | POA: Diagnosis not present

## 2016-04-06 DIAGNOSIS — D631 Anemia in chronic kidney disease: Secondary | ICD-10-CM | POA: Diagnosis not present

## 2016-04-07 ENCOUNTER — Encounter: Payer: Self-pay | Admitting: Vascular Surgery

## 2016-04-08 ENCOUNTER — Ambulatory Visit (INDEPENDENT_AMBULATORY_CARE_PROVIDER_SITE_OTHER): Payer: Medicare Other | Admitting: Vascular Surgery

## 2016-04-08 ENCOUNTER — Encounter: Payer: Self-pay | Admitting: Vascular Surgery

## 2016-04-08 ENCOUNTER — Ambulatory Visit (HOSPITAL_COMMUNITY)
Admission: RE | Admit: 2016-04-08 | Discharge: 2016-04-08 | Disposition: A | Discharge status: Home / Self Care | Payer: No Typology Code available for payment source | Source: Ambulatory Visit | Attending: Vascular Surgery | Admitting: Vascular Surgery

## 2016-04-08 VITALS — BP 110/65 | HR 96 | Temp 97.2°F | Resp 18 | Ht 75.5 in | Wt 240.0 lb

## 2016-04-08 DIAGNOSIS — N2581 Secondary hyperparathyroidism of renal origin: Secondary | ICD-10-CM | POA: Diagnosis not present

## 2016-04-08 DIAGNOSIS — Z4931 Encounter for adequacy testing for hemodialysis: Secondary | ICD-10-CM | POA: Diagnosis not present

## 2016-04-08 DIAGNOSIS — N186 End stage renal disease: Secondary | ICD-10-CM

## 2016-04-08 DIAGNOSIS — N2589 Other disorders resulting from impaired renal tubular function: Secondary | ICD-10-CM | POA: Diagnosis not present

## 2016-04-08 DIAGNOSIS — Z992 Dependence on renal dialysis: Secondary | ICD-10-CM

## 2016-04-08 DIAGNOSIS — D631 Anemia in chronic kidney disease: Secondary | ICD-10-CM | POA: Diagnosis not present

## 2016-04-08 NOTE — Progress Notes (Signed)
  POST OPERATIVE OFFICE NOTE    CC:  F/u for surgery  HPI:  This is a 65 y.o. male who is s/p Second stage basilic transposition 4/50/3888 by Dr. Scot Dock.  He currently uses a left IJ cath for HD.  No changes in his medical history since his last visit.  Allergies  Allergen Reactions  . No Known Allergies     Current Outpatient Prescriptions  Medication Sig Dispense Refill  . acetaminophen (TYLENOL) 325 MG tablet Take 650 mg by mouth every 6 (six) hours as needed (pain).    Marland Kitchen amlodipine-benazepril (LOTREL) 2.5-10 MG capsule Take 1 capsule by mouth daily.    . diclofenac sodium (VOLTAREN) 1 % GEL Apply topically 4 (four) times daily.    Marland Kitchen doxycycline (VIBRA-TABS) 100 MG tablet Take 1 tablet (100 mg total) by mouth every 12 (twelve) hours. 10 tablet 0  . ergocalciferol (VITAMIN D2) 50000 units capsule Take 50,000 Units by mouth once a week.    . insulin glargine (LANTUS) 100 UNIT/ML injection Inject 0.15 mLs (15 Units total) into the skin at bedtime. 10 mL 11  . lidocaine-prilocaine (EMLA) cream Apply 1 application topically as needed (Apply small amount to access site 1-2 hours before dialysis. Cover with occlusive dressing (saran wrap)).     . multivitamin (RENA-VIT) TABS tablet Take 1 tablet by mouth at bedtime. 30 tablet 0  . oxyCODONE-acetaminophen (PERCOCET/ROXICET) 5-325 MG tablet     . polysaccharide iron (NIFEREX) 150 MG CAPS capsule Take 1 capsule (150 mg total) by mouth daily. 30 each 2  . sevelamer carbonate (RENVELA) 800 MG tablet Take 1,600 mg by mouth 3 (three) times daily with meals.    . warfarin (COUMADIN) 5 MG tablet     . oxyCODONE (OXY IR/ROXICODONE) 5 MG immediate release tablet Take 1 tablet (5 mg total) by mouth every 6 (six) hours as needed for moderate pain. (Patient not taking: Reported on 04/08/2016) 30 tablet 0   No current facility-administered medications for this visit.      ROS:  See HPI  Physical Exam:  Vitals:   04/08/16 1517  BP: 110/65  Pulse:  96  Resp: 18  Temp: 97.2 F (36.2 C)    Incision:  Incisions well healed Extremities:  Left av fistula with palpable thrill, distal ulnar pulse palpable, grip 5/5 sensation intact   Assessment/Plan:  This is a 65 y.o. male who is s/p:  Second stage basilic transposition The fistula is  > .60 in diameter and < .50 in depth He has no symptoms of steal.  We want to wait until the first of Dec 2017 prior to sticking the fistula for HD.      Theda Sers Rheannon Cerney [facility] PA-C [facility] [phone]  Clinic MD:  Pt seen and examined with Dr. Scot Dock

## 2016-04-09 ENCOUNTER — Encounter: Payer: Self-pay | Admitting: Podiatry

## 2016-04-09 ENCOUNTER — Ambulatory Visit (INDEPENDENT_AMBULATORY_CARE_PROVIDER_SITE_OTHER): Payer: Medicare Other | Admitting: Podiatry

## 2016-04-09 VITALS — BP 119/76 | HR 70 | Temp 96.5°F

## 2016-04-09 DIAGNOSIS — Z89431 Acquired absence of right foot: Secondary | ICD-10-CM

## 2016-04-10 DIAGNOSIS — N2581 Secondary hyperparathyroidism of renal origin: Secondary | ICD-10-CM | POA: Diagnosis not present

## 2016-04-10 DIAGNOSIS — N186 End stage renal disease: Secondary | ICD-10-CM | POA: Diagnosis not present

## 2016-04-10 DIAGNOSIS — D631 Anemia in chronic kidney disease: Secondary | ICD-10-CM | POA: Diagnosis not present

## 2016-04-10 DIAGNOSIS — N2589 Other disorders resulting from impaired renal tubular function: Secondary | ICD-10-CM | POA: Diagnosis not present

## 2016-04-12 NOTE — Progress Notes (Signed)
Subjective: Patient presents today for further postop visit status post TMA right foot. Date of surgery was 03/13/2016.  Objective: Staples are intact. There is an extensive amount of dehiscence with ulceration noted to the lateral aspect of the transmetatarsal site. Drainage is noted. Peri-incisional area is macerated.  Assessment: Status post transmetatarsal amputation right foot. Dehiscence lateral aspect of the incision site. Ulceration with necrosis lateral aspect of the right foot stump.  Plan of care: Today dressings were changed. Skin staples were removed. Patient was evaluated. Orders for daily wound care was placed. Wound care will consist of daily application of iodine followed by a dry sterile dressing. Patient is currently being managed by a vascular. Patient is to return to clinic in 1 week.  Patient is going to be in the rehabilitation facility for 2 more weeks. Upon discharge there will be home health border dressing changes placed.

## 2016-04-13 DIAGNOSIS — N186 End stage renal disease: Secondary | ICD-10-CM | POA: Diagnosis not present

## 2016-04-13 DIAGNOSIS — N2589 Other disorders resulting from impaired renal tubular function: Secondary | ICD-10-CM | POA: Diagnosis not present

## 2016-04-13 DIAGNOSIS — N2581 Secondary hyperparathyroidism of renal origin: Secondary | ICD-10-CM | POA: Diagnosis not present

## 2016-04-13 DIAGNOSIS — D631 Anemia in chronic kidney disease: Secondary | ICD-10-CM | POA: Diagnosis not present

## 2016-04-14 ENCOUNTER — Other Ambulatory Visit: Payer: Self-pay | Admitting: *Deleted

## 2016-04-14 NOTE — Patient Outreach (Signed)
   [facility]) Care Management Post-Acute Care Coordination  04/14/2016  KVON MCILHENNY 08/08/1950 [id number]   Met with Brien Mates, Case manager at [facility] center. Reviewed patient case. She confirms that patient is in rehab but plans to remain [place] resident of facility and there are no plans to discharge at this time.  RNCM will sign off case.  Royetta Crochet. Laymond Purser, RN, BSN, [place] Post-Acute Care Coordinator [phone]

## 2016-04-15 DIAGNOSIS — D631 Anemia in chronic kidney disease: Secondary | ICD-10-CM | POA: Diagnosis not present

## 2016-04-15 DIAGNOSIS — N186 End stage renal disease: Secondary | ICD-10-CM | POA: Diagnosis not present

## 2016-04-15 DIAGNOSIS — N2581 Secondary hyperparathyroidism of renal origin: Secondary | ICD-10-CM | POA: Diagnosis not present

## 2016-04-15 DIAGNOSIS — N2589 Other disorders resulting from impaired renal tubular function: Secondary | ICD-10-CM | POA: Diagnosis not present

## 2016-04-16 ENCOUNTER — Ambulatory Visit (INDEPENDENT_AMBULATORY_CARE_PROVIDER_SITE_OTHER): Payer: Medicare Other | Admitting: Podiatry

## 2016-04-16 DIAGNOSIS — E1143 Type 2 diabetes mellitus with diabetic autonomic (poly)neuropathy: Secondary | ICD-10-CM | POA: Diagnosis not present

## 2016-04-16 DIAGNOSIS — L97513 Non-pressure chronic ulcer of other part of right foot with necrosis of muscle: Secondary | ICD-10-CM

## 2016-04-16 DIAGNOSIS — T8789 Other complications of amputation stump: Secondary | ICD-10-CM

## 2016-04-16 DIAGNOSIS — L97509 Non-pressure chronic ulcer of other part of unspecified foot with unspecified severity: Secondary | ICD-10-CM

## 2016-04-16 DIAGNOSIS — S98921A Partial traumatic amputation of right foot, level unspecified, initial encounter: Secondary | ICD-10-CM | POA: Insufficient documentation

## 2016-04-16 DIAGNOSIS — I70235 Atherosclerosis of native arteries of right leg with ulceration of other part of foot: Secondary | ICD-10-CM

## 2016-04-16 DIAGNOSIS — Z89431 Acquired absence of right foot: Secondary | ICD-10-CM

## 2016-04-17 DIAGNOSIS — N2581 Secondary hyperparathyroidism of renal origin: Secondary | ICD-10-CM | POA: Diagnosis not present

## 2016-04-17 DIAGNOSIS — N2589 Other disorders resulting from impaired renal tubular function: Secondary | ICD-10-CM | POA: Diagnosis not present

## 2016-04-17 DIAGNOSIS — N186 End stage renal disease: Secondary | ICD-10-CM | POA: Diagnosis not present

## 2016-04-17 DIAGNOSIS — D631 Anemia in chronic kidney disease: Secondary | ICD-10-CM | POA: Diagnosis not present

## 2016-04-19 DIAGNOSIS — Z8572 Personal history of non-Hodgkin lymphomas: Secondary | ICD-10-CM | POA: Diagnosis not present

## 2016-04-19 DIAGNOSIS — I120 Hypertensive chronic kidney disease with stage 5 chronic kidney disease or end stage renal disease: Secondary | ICD-10-CM | POA: Diagnosis not present

## 2016-04-19 DIAGNOSIS — E11319 Type 2 diabetes mellitus with unspecified diabetic retinopathy without macular edema: Secondary | ICD-10-CM | POA: Diagnosis not present

## 2016-04-19 DIAGNOSIS — D631 Anemia in chronic kidney disease: Secondary | ICD-10-CM | POA: Diagnosis not present

## 2016-04-19 DIAGNOSIS — Z86718 Personal history of other venous thrombosis and embolism: Secondary | ICD-10-CM | POA: Diagnosis not present

## 2016-04-19 DIAGNOSIS — Z794 Long term (current) use of insulin: Secondary | ICD-10-CM | POA: Diagnosis not present

## 2016-04-19 DIAGNOSIS — E785 Hyperlipidemia, unspecified: Secondary | ICD-10-CM | POA: Diagnosis not present

## 2016-04-19 DIAGNOSIS — E1122 Type 2 diabetes mellitus with diabetic chronic kidney disease: Secondary | ICD-10-CM | POA: Diagnosis not present

## 2016-04-19 DIAGNOSIS — Z992 Dependence on renal dialysis: Secondary | ICD-10-CM | POA: Diagnosis not present

## 2016-04-19 DIAGNOSIS — Z7901 Long term (current) use of anticoagulants: Secondary | ICD-10-CM | POA: Diagnosis not present

## 2016-04-19 DIAGNOSIS — E1151 Type 2 diabetes mellitus with diabetic peripheral angiopathy without gangrene: Secondary | ICD-10-CM | POA: Diagnosis not present

## 2016-04-19 DIAGNOSIS — N186 End stage renal disease: Secondary | ICD-10-CM | POA: Diagnosis not present

## 2016-04-19 DIAGNOSIS — Z89421 Acquired absence of other right toe(s): Secondary | ICD-10-CM | POA: Diagnosis not present

## 2016-04-19 DIAGNOSIS — Z4781 Encounter for orthopedic aftercare following surgical amputation: Secondary | ICD-10-CM | POA: Diagnosis not present

## 2016-04-20 DIAGNOSIS — N2589 Other disorders resulting from impaired renal tubular function: Secondary | ICD-10-CM | POA: Diagnosis not present

## 2016-04-20 DIAGNOSIS — N2581 Secondary hyperparathyroidism of renal origin: Secondary | ICD-10-CM | POA: Diagnosis not present

## 2016-04-20 DIAGNOSIS — N186 End stage renal disease: Secondary | ICD-10-CM | POA: Diagnosis not present

## 2016-04-20 DIAGNOSIS — D631 Anemia in chronic kidney disease: Secondary | ICD-10-CM | POA: Diagnosis not present

## 2016-04-20 NOTE — Progress Notes (Signed)
Subjective: Patient presents today for further postop visit status post TMA right foot. Date of surgery was 03/13/2016.  Objective: Staples are intact. There is an extensive amount of dehiscence with ulceration noted to the lateral aspect of the transmetatarsal site. Drainage is noted. Peri-incisional area is macerated.  Wound #1 located to the right foot amputation stump measuring 8.0 x 4.0 x 0.4 cm (LxWxD). To the noted ulceration there is exposed muscle and tendon. There is no exposed bone. There is no overlying eschar. There is an extensive amount of slough, fibrin, and necrotic tissue noted. Wound base is yellow with intermittent granulation tissue. There is a moderate amount of serosanguineous drainage noted. Periwound integrity is callused.   Assessment: Status post transmetatarsal amputation right foot. Dehiscence lateral aspect of the incision site. Ulceration with necrosis lateral aspect of the right foot stump.  Plan of care: Today dressings were changed. Patient was evaluated. Orders for wound care was placed every other day. Wound care will consist of daily application of iodine followed by a dry sterile dressing. Patient is currently being managed by a vascular. Patient is to return to clinic in 2 weeks.  Patient is going to be in the rehabilitation facility for 2 more weeks. Upon discharge there will be home health border dressing changes placed.

## 2016-04-22 DIAGNOSIS — N186 End stage renal disease: Secondary | ICD-10-CM | POA: Diagnosis not present

## 2016-04-22 DIAGNOSIS — D631 Anemia in chronic kidney disease: Secondary | ICD-10-CM | POA: Diagnosis not present

## 2016-04-22 DIAGNOSIS — N2581 Secondary hyperparathyroidism of renal origin: Secondary | ICD-10-CM | POA: Diagnosis not present

## 2016-04-22 DIAGNOSIS — N2589 Other disorders resulting from impaired renal tubular function: Secondary | ICD-10-CM | POA: Diagnosis not present

## 2016-04-24 DIAGNOSIS — E11319 Type 2 diabetes mellitus with unspecified diabetic retinopathy without macular edema: Secondary | ICD-10-CM | POA: Diagnosis not present

## 2016-04-24 DIAGNOSIS — N186 End stage renal disease: Secondary | ICD-10-CM | POA: Diagnosis not present

## 2016-04-24 DIAGNOSIS — E1122 Type 2 diabetes mellitus with diabetic chronic kidney disease: Secondary | ICD-10-CM | POA: Diagnosis not present

## 2016-04-24 DIAGNOSIS — I120 Hypertensive chronic kidney disease with stage 5 chronic kidney disease or end stage renal disease: Secondary | ICD-10-CM | POA: Diagnosis not present

## 2016-04-24 DIAGNOSIS — E1151 Type 2 diabetes mellitus with diabetic peripheral angiopathy without gangrene: Secondary | ICD-10-CM | POA: Diagnosis not present

## 2016-04-24 DIAGNOSIS — Z4781 Encounter for orthopedic aftercare following surgical amputation: Secondary | ICD-10-CM | POA: Diagnosis not present

## 2016-04-25 DIAGNOSIS — N2589 Other disorders resulting from impaired renal tubular function: Secondary | ICD-10-CM | POA: Diagnosis not present

## 2016-04-25 DIAGNOSIS — N2581 Secondary hyperparathyroidism of renal origin: Secondary | ICD-10-CM | POA: Diagnosis not present

## 2016-04-25 DIAGNOSIS — D631 Anemia in chronic kidney disease: Secondary | ICD-10-CM | POA: Diagnosis not present

## 2016-04-25 DIAGNOSIS — R197 Diarrhea, unspecified: Secondary | ICD-10-CM | POA: Insufficient documentation

## 2016-04-25 DIAGNOSIS — N186 End stage renal disease: Secondary | ICD-10-CM | POA: Diagnosis not present

## 2016-04-27 DIAGNOSIS — E1122 Type 2 diabetes mellitus with diabetic chronic kidney disease: Secondary | ICD-10-CM | POA: Diagnosis not present

## 2016-04-27 DIAGNOSIS — I120 Hypertensive chronic kidney disease with stage 5 chronic kidney disease or end stage renal disease: Secondary | ICD-10-CM | POA: Diagnosis not present

## 2016-04-27 DIAGNOSIS — N186 End stage renal disease: Secondary | ICD-10-CM | POA: Diagnosis not present

## 2016-04-27 DIAGNOSIS — Z4781 Encounter for orthopedic aftercare following surgical amputation: Secondary | ICD-10-CM | POA: Diagnosis not present

## 2016-04-27 DIAGNOSIS — E11319 Type 2 diabetes mellitus with unspecified diabetic retinopathy without macular edema: Secondary | ICD-10-CM | POA: Diagnosis not present

## 2016-04-27 DIAGNOSIS — E1151 Type 2 diabetes mellitus with diabetic peripheral angiopathy without gangrene: Secondary | ICD-10-CM | POA: Diagnosis not present

## 2016-04-28 DIAGNOSIS — N186 End stage renal disease: Secondary | ICD-10-CM | POA: Diagnosis not present

## 2016-04-28 DIAGNOSIS — N2581 Secondary hyperparathyroidism of renal origin: Secondary | ICD-10-CM | POA: Diagnosis not present

## 2016-04-28 DIAGNOSIS — N2589 Other disorders resulting from impaired renal tubular function: Secondary | ICD-10-CM | POA: Diagnosis not present

## 2016-04-28 DIAGNOSIS — D631 Anemia in chronic kidney disease: Secondary | ICD-10-CM | POA: Diagnosis not present

## 2016-04-29 DIAGNOSIS — I120 Hypertensive chronic kidney disease with stage 5 chronic kidney disease or end stage renal disease: Secondary | ICD-10-CM | POA: Diagnosis not present

## 2016-04-29 DIAGNOSIS — D631 Anemia in chronic kidney disease: Secondary | ICD-10-CM | POA: Diagnosis not present

## 2016-04-29 DIAGNOSIS — N2581 Secondary hyperparathyroidism of renal origin: Secondary | ICD-10-CM | POA: Diagnosis not present

## 2016-04-29 DIAGNOSIS — E1151 Type 2 diabetes mellitus with diabetic peripheral angiopathy without gangrene: Secondary | ICD-10-CM | POA: Diagnosis not present

## 2016-04-29 DIAGNOSIS — E1122 Type 2 diabetes mellitus with diabetic chronic kidney disease: Secondary | ICD-10-CM | POA: Diagnosis not present

## 2016-04-29 DIAGNOSIS — N2589 Other disorders resulting from impaired renal tubular function: Secondary | ICD-10-CM | POA: Diagnosis not present

## 2016-04-29 DIAGNOSIS — N186 End stage renal disease: Secondary | ICD-10-CM | POA: Diagnosis not present

## 2016-04-29 DIAGNOSIS — E11319 Type 2 diabetes mellitus with unspecified diabetic retinopathy without macular edema: Secondary | ICD-10-CM | POA: Diagnosis not present

## 2016-04-29 DIAGNOSIS — Z4781 Encounter for orthopedic aftercare following surgical amputation: Secondary | ICD-10-CM | POA: Diagnosis not present

## 2016-04-30 DIAGNOSIS — N2589 Other disorders resulting from impaired renal tubular function: Secondary | ICD-10-CM | POA: Diagnosis not present

## 2016-04-30 DIAGNOSIS — D631 Anemia in chronic kidney disease: Secondary | ICD-10-CM | POA: Diagnosis not present

## 2016-04-30 DIAGNOSIS — N2581 Secondary hyperparathyroidism of renal origin: Secondary | ICD-10-CM | POA: Diagnosis not present

## 2016-04-30 DIAGNOSIS — N186 End stage renal disease: Secondary | ICD-10-CM | POA: Diagnosis not present

## 2016-04-30 DIAGNOSIS — Z992 Dependence on renal dialysis: Secondary | ICD-10-CM | POA: Diagnosis not present

## 2016-04-30 DIAGNOSIS — E1129 Type 2 diabetes mellitus with other diabetic kidney complication: Secondary | ICD-10-CM | POA: Diagnosis not present

## 2016-05-01 DIAGNOSIS — E1151 Type 2 diabetes mellitus with diabetic peripheral angiopathy without gangrene: Secondary | ICD-10-CM | POA: Diagnosis not present

## 2016-05-01 DIAGNOSIS — I120 Hypertensive chronic kidney disease with stage 5 chronic kidney disease or end stage renal disease: Secondary | ICD-10-CM | POA: Diagnosis not present

## 2016-05-01 DIAGNOSIS — N186 End stage renal disease: Secondary | ICD-10-CM | POA: Diagnosis not present

## 2016-05-01 DIAGNOSIS — Z4781 Encounter for orthopedic aftercare following surgical amputation: Secondary | ICD-10-CM | POA: Diagnosis not present

## 2016-05-01 DIAGNOSIS — Z9989 Dependence on other enabling machines and devices: Secondary | ICD-10-CM | POA: Insufficient documentation

## 2016-05-01 DIAGNOSIS — E1122 Type 2 diabetes mellitus with diabetic chronic kidney disease: Secondary | ICD-10-CM | POA: Diagnosis not present

## 2016-05-01 DIAGNOSIS — E11319 Type 2 diabetes mellitus with unspecified diabetic retinopathy without macular edema: Secondary | ICD-10-CM | POA: Diagnosis not present

## 2016-05-02 DIAGNOSIS — N2581 Secondary hyperparathyroidism of renal origin: Secondary | ICD-10-CM | POA: Diagnosis not present

## 2016-05-02 DIAGNOSIS — N186 End stage renal disease: Secondary | ICD-10-CM | POA: Diagnosis not present

## 2016-05-02 DIAGNOSIS — E1129 Type 2 diabetes mellitus with other diabetic kidney complication: Secondary | ICD-10-CM | POA: Diagnosis not present

## 2016-05-02 DIAGNOSIS — D631 Anemia in chronic kidney disease: Secondary | ICD-10-CM | POA: Diagnosis not present

## 2016-05-04 ENCOUNTER — Ambulatory Visit (INDEPENDENT_AMBULATORY_CARE_PROVIDER_SITE_OTHER): Payer: Medicare Other | Admitting: Podiatry

## 2016-05-04 DIAGNOSIS — L97509 Non-pressure chronic ulcer of other part of unspecified foot with unspecified severity: Secondary | ICD-10-CM

## 2016-05-04 DIAGNOSIS — L97513 Non-pressure chronic ulcer of other part of right foot with necrosis of muscle: Secondary | ICD-10-CM

## 2016-05-04 DIAGNOSIS — Z7901 Long term (current) use of anticoagulants: Secondary | ICD-10-CM | POA: Diagnosis not present

## 2016-05-04 DIAGNOSIS — T8789 Other complications of amputation stump: Secondary | ICD-10-CM | POA: Diagnosis not present

## 2016-05-04 DIAGNOSIS — I4891 Unspecified atrial fibrillation: Secondary | ICD-10-CM | POA: Diagnosis not present

## 2016-05-04 DIAGNOSIS — I80202 Phlebitis and thrombophlebitis of unspecified deep vessels of left lower extremity: Secondary | ICD-10-CM | POA: Diagnosis not present

## 2016-05-04 DIAGNOSIS — I70235 Atherosclerosis of native arteries of right leg with ulceration of other part of foot: Secondary | ICD-10-CM

## 2016-05-04 MED ORDER — SULFAMETHOXAZOLE-TRIMETHOPRIM 800-160 MG PO TABS: 1.0000 | ORAL_TABLET | Freq: Two times a day (BID) | ORAL | 0 refills | Status: DC

## 2016-05-04 NOTE — Progress Notes (Signed)
Subjective: Patient presents today for further postop visit status post TMA right foot. Date of surgery was 03/13/2016.  Objective: There is an extensive amount of dehiscence with ulceration noted to the lateral aspect of the transmetatarsal site. Drainage is noted. Peri-incisional area is macerated.  Wound #1 located to the right foot amputation stump measuring 7.0 x 3.0 x 2.4 cm (LxWxD). To the noted ulceration there is exposed muscle and tendon. There is no exposed bone. There is no overlying eschar. There is an extensive amount of slough, fibrin, and necrotic tissue noted. Wound base is yellow with intermittent granulation tissue. There is a moderate amount of serosanguineous drainage noted. Periwound integrity is callused.   Assessment: Status post transmetatarsal amputation right foot. Dehiscence lateral aspect of the incision site. Ulceration with necrosis lateral aspect of the right foot stump.  Plan of care: #1 patient evaluated. Today dressings were changed. #2 medically necessary excisional debridement including muscle and deep fascial layers is performed using a tissue nipper. Excisional debridement of all necrotic nonviable tissue down to healthy bleeding viable tissue was performed with post-debridement measurements same as pre-. #3 Orders for wound care were modified today. Wound care will consist of packing iodoform gauze into the wound site and applying Betadine soaked gauze. Every other day 6 weeks. #4 prescription for Bactrim 2 weeks  #5 today cultures were taken  #6 return to clinic in 2 weeks

## 2016-05-05 ENCOUNTER — Telehealth: Payer: Self-pay | Admitting: *Deleted

## 2016-05-05 DIAGNOSIS — N2581 Secondary hyperparathyroidism of renal origin: Secondary | ICD-10-CM | POA: Diagnosis not present

## 2016-05-05 DIAGNOSIS — D631 Anemia in chronic kidney disease: Secondary | ICD-10-CM | POA: Diagnosis not present

## 2016-05-05 DIAGNOSIS — E1129 Type 2 diabetes mellitus with other diabetic kidney complication: Secondary | ICD-10-CM | POA: Diagnosis not present

## 2016-05-05 DIAGNOSIS — N186 End stage renal disease: Secondary | ICD-10-CM | POA: Diagnosis not present

## 2016-05-05 NOTE — Telephone Encounter (Addendum)
-----   Message from Edrick Kins, DPM sent at 05/04/2016 10:27 PM EST ----- Regarding: Please modify wound care orders Please modify wound care orders for patient as described below:   1. Pack 1/2" iodoform gauze strip into wound dehiscence site.  2. Dress wound with betadine soaked gauze, 4x4 gauze, kerlex, and ace wrap.  3. Leave steristrips intact.  Every other day x 6 weeks.   Dx : status post TMA right foot. Dehiscence of transmetatarsal amputation foot stump right foot.   Thanks, Dr. Amalia Hailey. 05/05/2016-[facility] in [place] came to the phone and states pt is now at home as of 04/17/2016. Faxed required [employer] form, LOV and demographics. 05/06/2016-Naomi Alvis Lemmings states pt is currently established with [facility], please fax orders to them. Faxed orders of 05/04/2016 to [facility]. 05/07/2016-Vanessa - [facility] states she needs clarification of wound care orders. Lorriane Shire asked what type of wound cleanse prior to dressing change, wound prep needed, is povidone solution okay instead of betadine which insurance won't cover unless in a stick, and how much compression with the ace. I told Lorriane Shire to use their standard wound cleanse spray or drench, povidone would be fine if not containing soap product, and compression enough to hold dressing in place should be enough. Lorriane Shire reviewed product literature to see if contained soap and stated it did not and they apply ace wrap at 50-50 tension.

## 2016-05-06 DIAGNOSIS — E1122 Type 2 diabetes mellitus with diabetic chronic kidney disease: Secondary | ICD-10-CM | POA: Diagnosis not present

## 2016-05-06 DIAGNOSIS — I120 Hypertensive chronic kidney disease with stage 5 chronic kidney disease or end stage renal disease: Secondary | ICD-10-CM | POA: Diagnosis not present

## 2016-05-06 DIAGNOSIS — E1151 Type 2 diabetes mellitus with diabetic peripheral angiopathy without gangrene: Secondary | ICD-10-CM | POA: Diagnosis not present

## 2016-05-06 DIAGNOSIS — E11319 Type 2 diabetes mellitus with unspecified diabetic retinopathy without macular edema: Secondary | ICD-10-CM | POA: Diagnosis not present

## 2016-05-06 DIAGNOSIS — N186 End stage renal disease: Secondary | ICD-10-CM | POA: Diagnosis not present

## 2016-05-06 DIAGNOSIS — Z4781 Encounter for orthopedic aftercare following surgical amputation: Secondary | ICD-10-CM | POA: Diagnosis not present

## 2016-05-07 DIAGNOSIS — N186 End stage renal disease: Secondary | ICD-10-CM | POA: Diagnosis not present

## 2016-05-07 DIAGNOSIS — N2581 Secondary hyperparathyroidism of renal origin: Secondary | ICD-10-CM | POA: Diagnosis not present

## 2016-05-07 DIAGNOSIS — D631 Anemia in chronic kidney disease: Secondary | ICD-10-CM | POA: Diagnosis not present

## 2016-05-07 DIAGNOSIS — E1129 Type 2 diabetes mellitus with other diabetic kidney complication: Secondary | ICD-10-CM | POA: Diagnosis not present

## 2016-05-08 DIAGNOSIS — E1151 Type 2 diabetes mellitus with diabetic peripheral angiopathy without gangrene: Secondary | ICD-10-CM | POA: Diagnosis not present

## 2016-05-08 DIAGNOSIS — N186 End stage renal disease: Secondary | ICD-10-CM | POA: Diagnosis not present

## 2016-05-08 DIAGNOSIS — E1122 Type 2 diabetes mellitus with diabetic chronic kidney disease: Secondary | ICD-10-CM | POA: Diagnosis not present

## 2016-05-08 DIAGNOSIS — Z4781 Encounter for orthopedic aftercare following surgical amputation: Secondary | ICD-10-CM | POA: Diagnosis not present

## 2016-05-08 DIAGNOSIS — E11319 Type 2 diabetes mellitus with unspecified diabetic retinopathy without macular edema: Secondary | ICD-10-CM | POA: Diagnosis not present

## 2016-05-08 DIAGNOSIS — I120 Hypertensive chronic kidney disease with stage 5 chronic kidney disease or end stage renal disease: Secondary | ICD-10-CM | POA: Diagnosis not present

## 2016-05-09 DIAGNOSIS — D631 Anemia in chronic kidney disease: Secondary | ICD-10-CM | POA: Diagnosis not present

## 2016-05-09 DIAGNOSIS — E1129 Type 2 diabetes mellitus with other diabetic kidney complication: Secondary | ICD-10-CM | POA: Diagnosis not present

## 2016-05-09 DIAGNOSIS — N2581 Secondary hyperparathyroidism of renal origin: Secondary | ICD-10-CM | POA: Diagnosis not present

## 2016-05-09 DIAGNOSIS — N186 End stage renal disease: Secondary | ICD-10-CM | POA: Diagnosis not present

## 2016-05-11 DIAGNOSIS — Z4781 Encounter for orthopedic aftercare following surgical amputation: Secondary | ICD-10-CM | POA: Diagnosis not present

## 2016-05-11 DIAGNOSIS — E1151 Type 2 diabetes mellitus with diabetic peripheral angiopathy without gangrene: Secondary | ICD-10-CM | POA: Diagnosis not present

## 2016-05-11 DIAGNOSIS — N186 End stage renal disease: Secondary | ICD-10-CM | POA: Diagnosis not present

## 2016-05-11 DIAGNOSIS — E1122 Type 2 diabetes mellitus with diabetic chronic kidney disease: Secondary | ICD-10-CM | POA: Diagnosis not present

## 2016-05-11 DIAGNOSIS — I120 Hypertensive chronic kidney disease with stage 5 chronic kidney disease or end stage renal disease: Secondary | ICD-10-CM | POA: Diagnosis not present

## 2016-05-11 DIAGNOSIS — E11319 Type 2 diabetes mellitus with unspecified diabetic retinopathy without macular edema: Secondary | ICD-10-CM | POA: Diagnosis not present

## 2016-05-12 DIAGNOSIS — N186 End stage renal disease: Secondary | ICD-10-CM | POA: Diagnosis not present

## 2016-05-12 DIAGNOSIS — D631 Anemia in chronic kidney disease: Secondary | ICD-10-CM | POA: Diagnosis not present

## 2016-05-12 DIAGNOSIS — E1129 Type 2 diabetes mellitus with other diabetic kidney complication: Secondary | ICD-10-CM | POA: Diagnosis not present

## 2016-05-12 DIAGNOSIS — N2581 Secondary hyperparathyroidism of renal origin: Secondary | ICD-10-CM | POA: Diagnosis not present

## 2016-05-13 ENCOUNTER — Encounter: Payer: Self-pay | Admitting: Cardiology

## 2016-05-13 DIAGNOSIS — K432 Incisional hernia without obstruction or gangrene: Secondary | ICD-10-CM | POA: Diagnosis not present

## 2016-05-13 DIAGNOSIS — E1129 Type 2 diabetes mellitus with other diabetic kidney complication: Secondary | ICD-10-CM | POA: Diagnosis not present

## 2016-05-13 DIAGNOSIS — C8590 Non-Hodgkin lymphoma, unspecified, unspecified site: Secondary | ICD-10-CM | POA: Diagnosis not present

## 2016-05-13 DIAGNOSIS — E113599 Type 2 diabetes mellitus with proliferative diabetic retinopathy without macular edema, unspecified eye: Secondary | ICD-10-CM | POA: Diagnosis not present

## 2016-05-13 DIAGNOSIS — C434 Malignant melanoma of scalp and neck: Secondary | ICD-10-CM | POA: Diagnosis not present

## 2016-05-13 DIAGNOSIS — Z6830 Body mass index (BMI) 30.0-30.9, adult: Secondary | ICD-10-CM | POA: Diagnosis not present

## 2016-05-13 DIAGNOSIS — E11319 Type 2 diabetes mellitus with unspecified diabetic retinopathy without macular edema: Secondary | ICD-10-CM | POA: Diagnosis not present

## 2016-05-13 DIAGNOSIS — Z7901 Long term (current) use of anticoagulants: Secondary | ICD-10-CM | POA: Diagnosis not present

## 2016-05-13 DIAGNOSIS — N186 End stage renal disease: Secondary | ICD-10-CM | POA: Diagnosis not present

## 2016-05-13 DIAGNOSIS — E1122 Type 2 diabetes mellitus with diabetic chronic kidney disease: Secondary | ICD-10-CM | POA: Diagnosis not present

## 2016-05-13 DIAGNOSIS — G6289 Other specified polyneuropathies: Secondary | ICD-10-CM | POA: Diagnosis not present

## 2016-05-13 DIAGNOSIS — Z08 Encounter for follow-up examination after completed treatment for malignant neoplasm: Secondary | ICD-10-CM | POA: Diagnosis not present

## 2016-05-13 DIAGNOSIS — I10 Essential (primary) hypertension: Secondary | ICD-10-CM | POA: Diagnosis not present

## 2016-05-13 DIAGNOSIS — Z4781 Encounter for orthopedic aftercare following surgical amputation: Secondary | ICD-10-CM | POA: Diagnosis not present

## 2016-05-13 DIAGNOSIS — I80202 Phlebitis and thrombophlebitis of unspecified deep vessels of left lower extremity: Secondary | ICD-10-CM | POA: Diagnosis not present

## 2016-05-13 DIAGNOSIS — I4891 Unspecified atrial fibrillation: Secondary | ICD-10-CM | POA: Diagnosis not present

## 2016-05-13 DIAGNOSIS — C4440 Unspecified malignant neoplasm of skin of scalp and neck: Secondary | ICD-10-CM | POA: Diagnosis not present

## 2016-05-13 DIAGNOSIS — E1151 Type 2 diabetes mellitus with diabetic peripheral angiopathy without gangrene: Secondary | ICD-10-CM | POA: Diagnosis not present

## 2016-05-13 DIAGNOSIS — E784 Other hyperlipidemia: Secondary | ICD-10-CM | POA: Diagnosis not present

## 2016-05-13 DIAGNOSIS — I120 Hypertensive chronic kidney disease with stage 5 chronic kidney disease or end stage renal disease: Secondary | ICD-10-CM | POA: Diagnosis not present

## 2016-05-13 DIAGNOSIS — Z923 Personal history of irradiation: Secondary | ICD-10-CM | POA: Diagnosis not present

## 2016-05-14 DIAGNOSIS — D631 Anemia in chronic kidney disease: Secondary | ICD-10-CM | POA: Diagnosis not present

## 2016-05-14 DIAGNOSIS — N2581 Secondary hyperparathyroidism of renal origin: Secondary | ICD-10-CM | POA: Diagnosis not present

## 2016-05-14 DIAGNOSIS — N186 End stage renal disease: Secondary | ICD-10-CM | POA: Diagnosis not present

## 2016-05-14 DIAGNOSIS — E1129 Type 2 diabetes mellitus with other diabetic kidney complication: Secondary | ICD-10-CM | POA: Diagnosis not present

## 2016-05-15 DIAGNOSIS — E1151 Type 2 diabetes mellitus with diabetic peripheral angiopathy without gangrene: Secondary | ICD-10-CM | POA: Diagnosis not present

## 2016-05-15 DIAGNOSIS — E1122 Type 2 diabetes mellitus with diabetic chronic kidney disease: Secondary | ICD-10-CM | POA: Diagnosis not present

## 2016-05-15 DIAGNOSIS — I120 Hypertensive chronic kidney disease with stage 5 chronic kidney disease or end stage renal disease: Secondary | ICD-10-CM | POA: Diagnosis not present

## 2016-05-15 DIAGNOSIS — E11319 Type 2 diabetes mellitus with unspecified diabetic retinopathy without macular edema: Secondary | ICD-10-CM | POA: Diagnosis not present

## 2016-05-15 DIAGNOSIS — N186 End stage renal disease: Secondary | ICD-10-CM | POA: Diagnosis not present

## 2016-05-15 DIAGNOSIS — Z4781 Encounter for orthopedic aftercare following surgical amputation: Secondary | ICD-10-CM | POA: Diagnosis not present

## 2016-05-16 DIAGNOSIS — D631 Anemia in chronic kidney disease: Secondary | ICD-10-CM | POA: Diagnosis not present

## 2016-05-16 DIAGNOSIS — N186 End stage renal disease: Secondary | ICD-10-CM | POA: Diagnosis not present

## 2016-05-16 DIAGNOSIS — E1129 Type 2 diabetes mellitus with other diabetic kidney complication: Secondary | ICD-10-CM | POA: Diagnosis not present

## 2016-05-16 DIAGNOSIS — N2581 Secondary hyperparathyroidism of renal origin: Secondary | ICD-10-CM | POA: Diagnosis not present

## 2016-05-18 DIAGNOSIS — N186 End stage renal disease: Secondary | ICD-10-CM | POA: Diagnosis not present

## 2016-05-18 DIAGNOSIS — I120 Hypertensive chronic kidney disease with stage 5 chronic kidney disease or end stage renal disease: Secondary | ICD-10-CM | POA: Diagnosis not present

## 2016-05-18 DIAGNOSIS — E11319 Type 2 diabetes mellitus with unspecified diabetic retinopathy without macular edema: Secondary | ICD-10-CM | POA: Diagnosis not present

## 2016-05-18 DIAGNOSIS — E1122 Type 2 diabetes mellitus with diabetic chronic kidney disease: Secondary | ICD-10-CM | POA: Diagnosis not present

## 2016-05-18 DIAGNOSIS — Z4781 Encounter for orthopedic aftercare following surgical amputation: Secondary | ICD-10-CM | POA: Diagnosis not present

## 2016-05-18 DIAGNOSIS — E1151 Type 2 diabetes mellitus with diabetic peripheral angiopathy without gangrene: Secondary | ICD-10-CM | POA: Diagnosis not present

## 2016-05-19 DIAGNOSIS — N186 End stage renal disease: Secondary | ICD-10-CM | POA: Diagnosis not present

## 2016-05-19 DIAGNOSIS — D631 Anemia in chronic kidney disease: Secondary | ICD-10-CM | POA: Diagnosis not present

## 2016-05-19 DIAGNOSIS — E1129 Type 2 diabetes mellitus with other diabetic kidney complication: Secondary | ICD-10-CM | POA: Diagnosis not present

## 2016-05-19 DIAGNOSIS — N2581 Secondary hyperparathyroidism of renal origin: Secondary | ICD-10-CM | POA: Diagnosis not present

## 2016-05-20 ENCOUNTER — Encounter: Payer: Self-pay | Admitting: Cardiology

## 2016-05-20 ENCOUNTER — Ambulatory Visit: Payer: Medicare Other | Admitting: Podiatry

## 2016-05-20 ENCOUNTER — Ambulatory Visit (INDEPENDENT_AMBULATORY_CARE_PROVIDER_SITE_OTHER): Payer: Medicare Other | Admitting: Cardiology

## 2016-05-20 VITALS — BP 98/60 | HR 96 | Ht 75.5 in | Wt 228.0 lb

## 2016-05-20 DIAGNOSIS — I480 Paroxysmal atrial fibrillation: Secondary | ICD-10-CM | POA: Diagnosis not present

## 2016-05-20 DIAGNOSIS — E1151 Type 2 diabetes mellitus with diabetic peripheral angiopathy without gangrene: Secondary | ICD-10-CM | POA: Diagnosis not present

## 2016-05-20 DIAGNOSIS — E1122 Type 2 diabetes mellitus with diabetic chronic kidney disease: Secondary | ICD-10-CM | POA: Diagnosis not present

## 2016-05-20 DIAGNOSIS — Z4781 Encounter for orthopedic aftercare following surgical amputation: Secondary | ICD-10-CM | POA: Diagnosis not present

## 2016-05-20 DIAGNOSIS — E11319 Type 2 diabetes mellitus with unspecified diabetic retinopathy without macular edema: Secondary | ICD-10-CM | POA: Diagnosis not present

## 2016-05-20 DIAGNOSIS — N186 End stage renal disease: Secondary | ICD-10-CM | POA: Diagnosis not present

## 2016-05-20 DIAGNOSIS — I120 Hypertensive chronic kidney disease with stage 5 chronic kidney disease or end stage renal disease: Secondary | ICD-10-CM | POA: Diagnosis not present

## 2016-05-20 NOTE — Progress Notes (Signed)
Electrophysiology Office Note   Date:  05/20/2016   ID:  John, [name] March 06, 1951, MRN [id number]  PCP:  Geoffery Lyons, MD  Primary Electrophysiologist:  Constance Haw, MD    Chief Complaint  Patient presents with  . Advice Only    AFib     History of Present Illness: [name] is a 65 y.o. male who presents today for electrophysiology evaluation.   He has a past history of CK D on dialysis, diabetes, DVT, hyperlipidemia, hypertension, and SVT. He was put on Coumadin for a DVT with filter placed in 2014. It appears that he potentially had a diagnosis of atrial fibrillation in 2016 while on his Coumadin. He is being referred for evaluation of atrial fibrillation. Currently, he is feeling quite poorly. He says that he ate a meal with meals on wheels yesterday, and has been vomiting ever since. He says that otherwise he feels well without major complaint.   Today, he denies symptoms of palpitations, chest pain, shortness of breath, orthopnea, PND, lower extremity edema, claudication, dizziness, presyncope, syncope, bleeding, or neurologic sequela. The patient is tolerating medications without difficulties and is otherwise without complaint today.    Past Medical History:  Diagnosis Date  . Anemia   . Arthritis    HNP- lumbar, "all over my body"  . Blood transfusion    "years ago; blood was low" (08/05/2013)  . CKD (chronic kidney disease) stage 4, GFR 15-29 ml/min (HCC) 03/18/2012   [place]- T,TH,Sat.  . Diabetic nephropathy ([place])   . Diabetic retinopathy   . DVT (deep venous thrombosis) ([place])    "got one in my right leg now; I've had one before too, not sure which leg" (08/05/2013)  . ESRD (end stage renal disease) on dialysis [facility])    "just started today, (08/04/2013)"  . Family history of anesthesia complication    " my son wakes up slowly"  . GERD (gastroesophageal reflux disease)    uses alka seltzere on occas.   [name])    "one  q now and then" (08/05/2013)  . Hyperlipidemia   . Hypertension   . 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 Nodular lymphoma of intra-abdominal lymph nodes ([place])   . Non Hodgkin's lymphoma ([place])    Tx 2009; "had chemo; it went away" (08/05/2013)  . Noncompliance 03/16/2012  . NSVT (nonsustained ventricular tachycardia) ([place]) 03/18/2012  . Peripheral vascular disease ([place])   . Pneumonia 2013   hosp.-   . Poor historian    pt. unsure of several answers to health history questions   . Skin cancer    melanoma - head  . Sleep apnea    "suppose to have a sleep study, but they never told me when. (08/05/2013)   Past Surgical History:  Procedure Laterality Date  . ACHILLES TENDON SURGERY Right 03/13/2016   Procedure: ACHILLES LENGTHENING/KIDNER;  Surgeon: Edrick Kins, DPM;  Location: [place];  Service: Podiatry;  Laterality: Right;  . AV FISTULA PLACEMENT Left 02/03/2013   Procedure: ARTERIOVENOUS (AV) FISTULA CREATION- LEFT RADIAL CEPHALIC; ULTRASOUND GUIDED;  Surgeon: Mal Misty, MD;  Location[facility] OR;  Service: Vascular;  Laterality: Left;  . AV FISTULA PLACEMENT Right 11/08/2015   Procedure: RIGHT BRACHIOCEPHALIC ARTERIOVENOUS (AV) FISTULA CREATION;  Surgeon: Serafina Mitchell, MD;  Location: [place];  Service: Vascular;  Laterality: Right;  . BASCILIC VEIN TRANSPOSITION Right 01/24/2016   Procedure: RIGHT SECOND STAGE BASILIC VEIN  TRANSPOSITION;  Surgeon: Angelia Mould, MD;  Location: [place];  Service: Vascular;  Laterality: Right;  . CARDIAC CATHETERIZATION    . COLONOSCOPY N/A 09/23/2015   Procedure: COLONOSCOPY;  Surgeon: Irene Shipper, MD;  Location: [facility];  Service: Endoscopy;  Laterality: N/A;  . Coloscopy    . EYE SURGERY Bilateral   . GAS INSERTION  05/10/2012   Procedure: INSERTION OF GAS;  Surgeon: Hayden Pedro, MD;  Location: [place];  Service: Ophthalmology;  Laterality: Right;  . LESION EXCISION Right 05/08/2015   Procedure: EXCISION SCALP LESION;   Surgeon: Erroll Luna, MD;  Location: [place];  Service: General;  Laterality: Right;  . MEMBRANE PEEL  05/10/2012   Procedure: MEMBRANE PEEL;  Surgeon: Hayden Pedro, MD;  Location: [place];  Service: Ophthalmology;  Laterality: Right;  . PARS PLANA VITRECTOMY  08/27/2011   Procedure: PARS PLANA VITRECTOMY WITH 25 GAUGE;  Surgeon: Hayden Pedro, MD;  Location: [place];  Service: Ophthalmology;  Laterality: Left;  Repair of complex traction retinal detachment left eye  . PARS PLANA VITRECTOMY  05/10/2012   Procedure: PARS PLANA VITRECTOMY WITH 25 GAUGE;  Surgeon: Hayden Pedro, MD;  Location: [place];  Service: Ophthalmology;  Laterality: Right;  Repair Complex Traction Retinal Detachment  . PHOTOCOAGULATION WITH LASER  05/10/2012   Procedure: PHOTOCOAGULATION WITH LASER;  Surgeon: Hayden Pedro, MD;  Location: [place];  Service: Ophthalmology;  Laterality: Right;  . PORT-A-CATH REMOVAL    . PORTACATH PLACEMENT    . TRANSMETATARSAL AMPUTATION Right 03/13/2016   Procedure: TRANSMETATARSAL AMPUTATION;  Surgeon: Edrick Kins, DPM;  Location: [place];  Service: Podiatry;  Laterality: Right;  . VENA CAVA FILTER PLACEMENT  09/2012   due to preparation for surgery     Current Outpatient Prescriptions  Medication Sig Dispense Refill  . acetaminophen (TYLENOL) 325 MG tablet Take 650 mg by mouth every 6 (six) hours as needed (pain).    Marland Kitchen amlodipine-benazepril (LOTREL) 2.5-10 MG capsule Take 1 capsule by mouth daily.    . diclofenac sodium (VOLTAREN) 1 % GEL Apply topically 4 (four) times daily.    Marland Kitchen doxycycline (VIBRA-TABS) 100 MG tablet Take 1 tablet (100 mg total) by mouth every 12 (twelve) hours. 10 tablet 0  . ergocalciferol (VITAMIN D2) 50000 units capsule Take 50,000 Units by mouth once a week.    . insulin glargine (LANTUS) 100 UNIT/ML injection Inject 0.15 mLs (15 Units total) into the skin at bedtime. 10 mL 11  . lidocaine-prilocaine (EMLA) cream Apply 1 application topically as needed (Apply  small amount to access site 1-2 hours before dialysis. Cover with occlusive dressing (saran wrap)).     . multivitamin (RENA-VIT) TABS tablet Take 1 tablet by mouth at bedtime. 30 tablet 0  . oxyCODONE (OXY IR/ROXICODONE) 5 MG immediate release tablet Take 1 tablet (5 mg total) by mouth every 6 (six) hours as needed for moderate pain. 30 tablet 0  . oxyCODONE-acetaminophen (PERCOCET/ROXICET) 5-325 MG tablet     . polysaccharide iron (NIFEREX) 150 MG CAPS capsule Take 1 capsule (150 mg total) by mouth daily. 30 each 2  . sevelamer carbonate (RENVELA) 800 MG tablet Take 1,600 mg by mouth 3 (three) times daily with meals.    Marland Kitchen sulfamethoxazole-trimethoprim (BACTRIM DS,SEPTRA DS) 800-160 MG tablet Take 1 tablet by mouth 2 (two) times daily. 28 tablet 0  . warfarin (COUMADIN) 5 MG tablet      No current facility-administered medications for this visit.  Allergies:   No known allergies   Social History:  The patient  reports that he has never smoked. He has never used smokeless tobacco. He reports that he does not drink alcohol or use drugs.   Family History:  The patient's family history includes Anesthesia problems in his son; Diabetes in his father; Hypertension in his father and son; Other in his father.    ROS:  Please see the history of present illness.   Otherwise, review of systems is positive for Nausea and vomiting.   All other systems are reviewed and negative.    PHYSICAL EXAM: VS:  BP 98/60   Pulse 96   Ht 6' 3.5" (1.918 m)   Wt 228 lb (103.4 kg)   BMI 28.12 kg/m  , BMI Body mass index is 28.12 kg/m. GEN: Well nourished, well developed, in no acute distress  HEENT: normal  Neck: no JVD, carotid bruits, or masses Cardiac: RRR; no murmurs, rubs, or gallops,no edema  Respiratory:  clear to auscultation bilaterally, normal work of breathing GI: soft, nontender, nondistended, + BS MS: no deformity or atrophy  Skin: warm and dry,  Neuro:  Strength and sensation are  intact Psych: euthymic mood, full affect  EKG:  EKG is ordered today. Personal review of the ekg ordered 03/13/16 shows sinus rhythm, right bundle branch block, left anterior fascicular block  Recent Labs: 03/06/2016: ALT 14 03/16/2016: BUN 32; Creatinine, Ser 6.16; Potassium 4.3; Sodium 134 03/17/2016: Hemoglobin 9.2; Platelets 340    Lipid Panel     Component Value Date/Time   CHOL 129 01/14/2011 0437   TRIG 137 01/14/2011 0437   HDL 13 (L) 01/14/2011 0437   CHOLHDL 9.9 01/14/2011 0437   VLDL 27 01/14/2011 0437   LDLCALC 89 01/14/2011 0437     Wt Readings from Last 3 Encounters:  05/20/16 228 lb (103.4 kg)  04/08/16 240 lb (108.9 kg)  03/17/16 239 lb 6.7 oz (108.6 kg)      Other studies Reviewed: Additional studies/ records that were reviewed today include: TTE 2014  Review of the above records today demonstrates:   - Left ventricle: The cavity size was normal. Wall thickness was increased in a pattern of mild LVH. Systolic function was normal. The estimated ejection fraction was in the range of 55% to 60%. - Mitral valve: Mild regurgitation. - Left atrium: The atrium was mildly dilated. - Atrial septum: No defect or patent foramen ovale was identified. - Pulmonary arteries: PA peak pressure: 42mm Hg (S). - Pericardium, extracardiac: A trivial pericardial effusion was identified.   ASSESSMENT AND PLAN:  1.  Atrial fibrillation: Likely paroxysmal. He is on Coumadin for anticoagulation with an elevated risk of stroke. He was a little bit elevated in the 90s today, but her EKG was not done as he was actively vomiting in the office. We [name] repeat his echocardiogram to see if there have been any changes since 2014. We [name] bring him back in 3 months for further management of atrial fibrillation once his echocardiogram has been completed.  This patients CHA2DS2-VASc Score and unadjusted Ischemic Stroke Rate (% per year) is equal to 3.2 % stroke rate/year  from a score of 3  Above score calculated as 1 point each if present [CHF, HTN, DM, Vascular=MI/PAD/Aortic Plaque, Age if 65-74, or Male] Above score calculated as 2 points each if present [Age > 75, or Stroke/TIA/TE]   2. Hypertension: Well-controlled today. Managed by primary care.  3. Food poisoning: Educated the patient on nausea  and vomiting. If he does continue to vomit. Told him to likely go to the emergency room or call his primary physician for further management. There is a concern that he could potentially become dehydrated.  Current medicines are reviewed at length with the patient today.   The patient does not have concerns regarding his medicines.  The following changes were made today:  none  Labs/ tests ordered today include:  Orders Placed This Encounter  Procedures  . ECHOCARDIOGRAM COMPLETE     Disposition:   FU with [name] 3 months  Signed, John Scales [name], MD  05/20/2016 12:26 PM     [place] [address] [phone] (office) [phone] (fax)

## 2016-05-20 NOTE — Patient Instructions (Signed)
Medication Instructions: Your physician recommends that you continue on your current medications as directed. Please refer to the Current Medication list given to you today.   Labwork: None ordered  Procedures/Testing: Your physician has requested that you have an echocardiogram in 3 months. Echocardiography is a painless test that uses sound waves to create images of your heart. It provides your doctor with information about the size and shape of your heart and how well your heart's chambers and valves are working. This procedure takes approximately one hour. There are no restrictions for this procedure.    Follow-Up: Your physician recommends that you schedule a follow-up appointment in 3 months with Dr. Curt Bears.   Any Additional Special Instructions Will Be Listed Below (If Applicable).  Echocardiogram An echocardiogram, or echocardiography, uses sound waves (ultrasound) to produce an image of your heart. The echocardiogram is simple, painless, obtained within a short period of time, and offers valuable information to your health care provider. The images from an echocardiogram can provide information such as:  Evidence of coronary artery disease (CAD).  Heart size.  Heart muscle function.  Heart valve function.  Aneurysm detection.  Evidence of a past heart attack.  Fluid buildup around the heart.  Heart muscle thickening.  Assess heart valve function. Tell a health care provider about:  Any allergies you have.  All medicines you are taking, including vitamins, herbs, eye drops, creams, and over-the-counter medicines.  Any problems you or family members have had with anesthetic medicines.  Any blood disorders you have.  Any surgeries you have had.  Any medical conditions you have.  Whether you are pregnant or may be pregnant. What happens before the procedure? No special preparation is needed. Eat and drink normally. What happens during the procedure?  In  order to produce an image of your heart, gel will be applied to your chest and a wand-like tool (transducer) will be moved over your chest. The gel will help transmit the sound waves from the transducer. The sound waves will harmlessly bounce off your heart to allow the heart images to be captured in real-time motion. These images will then be recorded.  You may need an IV to receive a medicine that improves the quality of the pictures. What happens after the procedure? You may return to your normal schedule including diet, activities, and medicines, unless your health care provider tells you otherwise. This information is not intended to replace advice given to you by your health care provider. Make sure you discuss any questions you have with your health care provider. Document Released: 05/15/2000 Document Revised: 01/04/2016 Document Reviewed: 01/23/2013 Elsevier Interactive Patient Education  2017 [employer].     If you need a refill on your cardiac medications before your next appointment, please call your pharmacy.

## 2016-05-21 DIAGNOSIS — N2581 Secondary hyperparathyroidism of renal origin: Secondary | ICD-10-CM | POA: Diagnosis not present

## 2016-05-21 DIAGNOSIS — N186 End stage renal disease: Secondary | ICD-10-CM | POA: Diagnosis not present

## 2016-05-21 DIAGNOSIS — E1129 Type 2 diabetes mellitus with other diabetic kidney complication: Secondary | ICD-10-CM | POA: Diagnosis not present

## 2016-05-21 DIAGNOSIS — D631 Anemia in chronic kidney disease: Secondary | ICD-10-CM | POA: Diagnosis not present

## 2016-05-22 DIAGNOSIS — N186 End stage renal disease: Secondary | ICD-10-CM | POA: Diagnosis not present

## 2016-05-22 DIAGNOSIS — E1122 Type 2 diabetes mellitus with diabetic chronic kidney disease: Secondary | ICD-10-CM | POA: Diagnosis not present

## 2016-05-22 DIAGNOSIS — I120 Hypertensive chronic kidney disease with stage 5 chronic kidney disease or end stage renal disease: Secondary | ICD-10-CM | POA: Diagnosis not present

## 2016-05-22 DIAGNOSIS — E1151 Type 2 diabetes mellitus with diabetic peripheral angiopathy without gangrene: Secondary | ICD-10-CM | POA: Diagnosis not present

## 2016-05-22 DIAGNOSIS — Z4781 Encounter for orthopedic aftercare following surgical amputation: Secondary | ICD-10-CM | POA: Diagnosis not present

## 2016-05-22 DIAGNOSIS — E11319 Type 2 diabetes mellitus with unspecified diabetic retinopathy without macular edema: Secondary | ICD-10-CM | POA: Diagnosis not present

## 2016-05-23 DIAGNOSIS — E1129 Type 2 diabetes mellitus with other diabetic kidney complication: Secondary | ICD-10-CM | POA: Diagnosis not present

## 2016-05-23 DIAGNOSIS — D631 Anemia in chronic kidney disease: Secondary | ICD-10-CM | POA: Diagnosis not present

## 2016-05-23 DIAGNOSIS — N2581 Secondary hyperparathyroidism of renal origin: Secondary | ICD-10-CM | POA: Diagnosis not present

## 2016-05-23 DIAGNOSIS — N186 End stage renal disease: Secondary | ICD-10-CM | POA: Diagnosis not present

## 2016-05-24 DIAGNOSIS — N186 End stage renal disease: Secondary | ICD-10-CM | POA: Diagnosis not present

## 2016-05-24 DIAGNOSIS — I120 Hypertensive chronic kidney disease with stage 5 chronic kidney disease or end stage renal disease: Secondary | ICD-10-CM | POA: Diagnosis not present

## 2016-05-24 DIAGNOSIS — E11319 Type 2 diabetes mellitus with unspecified diabetic retinopathy without macular edema: Secondary | ICD-10-CM | POA: Diagnosis not present

## 2016-05-24 DIAGNOSIS — E1122 Type 2 diabetes mellitus with diabetic chronic kidney disease: Secondary | ICD-10-CM | POA: Diagnosis not present

## 2016-05-24 DIAGNOSIS — Z4781 Encounter for orthopedic aftercare following surgical amputation: Secondary | ICD-10-CM | POA: Diagnosis not present

## 2016-05-24 DIAGNOSIS — E1151 Type 2 diabetes mellitus with diabetic peripheral angiopathy without gangrene: Secondary | ICD-10-CM | POA: Diagnosis not present

## 2016-05-26 DIAGNOSIS — E1129 Type 2 diabetes mellitus with other diabetic kidney complication: Secondary | ICD-10-CM | POA: Diagnosis not present

## 2016-05-26 DIAGNOSIS — D631 Anemia in chronic kidney disease: Secondary | ICD-10-CM | POA: Diagnosis not present

## 2016-05-26 DIAGNOSIS — N2581 Secondary hyperparathyroidism of renal origin: Secondary | ICD-10-CM | POA: Diagnosis not present

## 2016-05-26 DIAGNOSIS — N186 End stage renal disease: Secondary | ICD-10-CM | POA: Diagnosis not present

## 2016-05-27 DIAGNOSIS — Z4781 Encounter for orthopedic aftercare following surgical amputation: Secondary | ICD-10-CM | POA: Diagnosis not present

## 2016-05-27 DIAGNOSIS — N186 End stage renal disease: Secondary | ICD-10-CM | POA: Diagnosis not present

## 2016-05-27 DIAGNOSIS — E11319 Type 2 diabetes mellitus with unspecified diabetic retinopathy without macular edema: Secondary | ICD-10-CM | POA: Diagnosis not present

## 2016-05-27 DIAGNOSIS — E1151 Type 2 diabetes mellitus with diabetic peripheral angiopathy without gangrene: Secondary | ICD-10-CM | POA: Diagnosis not present

## 2016-05-27 DIAGNOSIS — I120 Hypertensive chronic kidney disease with stage 5 chronic kidney disease or end stage renal disease: Secondary | ICD-10-CM | POA: Diagnosis not present

## 2016-05-27 DIAGNOSIS — E1122 Type 2 diabetes mellitus with diabetic chronic kidney disease: Secondary | ICD-10-CM | POA: Diagnosis not present

## 2016-05-28 DIAGNOSIS — E1129 Type 2 diabetes mellitus with other diabetic kidney complication: Secondary | ICD-10-CM | POA: Diagnosis not present

## 2016-05-28 DIAGNOSIS — D631 Anemia in chronic kidney disease: Secondary | ICD-10-CM | POA: Diagnosis not present

## 2016-05-28 DIAGNOSIS — N2581 Secondary hyperparathyroidism of renal origin: Secondary | ICD-10-CM | POA: Diagnosis not present

## 2016-05-28 DIAGNOSIS — N186 End stage renal disease: Secondary | ICD-10-CM | POA: Diagnosis not present

## 2016-05-29 DIAGNOSIS — E1122 Type 2 diabetes mellitus with diabetic chronic kidney disease: Secondary | ICD-10-CM | POA: Diagnosis not present

## 2016-05-29 DIAGNOSIS — E1151 Type 2 diabetes mellitus with diabetic peripheral angiopathy without gangrene: Secondary | ICD-10-CM | POA: Diagnosis not present

## 2016-05-29 DIAGNOSIS — Z4781 Encounter for orthopedic aftercare following surgical amputation: Secondary | ICD-10-CM | POA: Diagnosis not present

## 2016-05-29 DIAGNOSIS — E11319 Type 2 diabetes mellitus with unspecified diabetic retinopathy without macular edema: Secondary | ICD-10-CM | POA: Diagnosis not present

## 2016-05-29 DIAGNOSIS — I120 Hypertensive chronic kidney disease with stage 5 chronic kidney disease or end stage renal disease: Secondary | ICD-10-CM | POA: Diagnosis not present

## 2016-05-29 DIAGNOSIS — N186 End stage renal disease: Secondary | ICD-10-CM | POA: Diagnosis not present

## 2016-05-30 DIAGNOSIS — D631 Anemia in chronic kidney disease: Secondary | ICD-10-CM | POA: Diagnosis not present

## 2016-05-30 DIAGNOSIS — N186 End stage renal disease: Secondary | ICD-10-CM | POA: Diagnosis not present

## 2016-05-30 DIAGNOSIS — E1129 Type 2 diabetes mellitus with other diabetic kidney complication: Secondary | ICD-10-CM | POA: Diagnosis not present

## 2016-05-30 DIAGNOSIS — N2581 Secondary hyperparathyroidism of renal origin: Secondary | ICD-10-CM | POA: Diagnosis not present

## 2016-05-31 DIAGNOSIS — N186 End stage renal disease: Secondary | ICD-10-CM | POA: Diagnosis not present

## 2016-05-31 DIAGNOSIS — Z992 Dependence on renal dialysis: Secondary | ICD-10-CM | POA: Diagnosis not present

## 2016-05-31 DIAGNOSIS — E1129 Type 2 diabetes mellitus with other diabetic kidney complication: Secondary | ICD-10-CM | POA: Diagnosis not present

## 2016-06-01 DIAGNOSIS — E1122 Type 2 diabetes mellitus with diabetic chronic kidney disease: Secondary | ICD-10-CM | POA: Diagnosis not present

## 2016-06-01 DIAGNOSIS — I120 Hypertensive chronic kidney disease with stage 5 chronic kidney disease or end stage renal disease: Secondary | ICD-10-CM | POA: Diagnosis not present

## 2016-06-01 DIAGNOSIS — Z4781 Encounter for orthopedic aftercare following surgical amputation: Secondary | ICD-10-CM | POA: Diagnosis not present

## 2016-06-01 DIAGNOSIS — E1151 Type 2 diabetes mellitus with diabetic peripheral angiopathy without gangrene: Secondary | ICD-10-CM | POA: Diagnosis not present

## 2016-06-01 DIAGNOSIS — N186 End stage renal disease: Secondary | ICD-10-CM | POA: Diagnosis not present

## 2016-06-01 DIAGNOSIS — E11319 Type 2 diabetes mellitus with unspecified diabetic retinopathy without macular edema: Secondary | ICD-10-CM | POA: Diagnosis not present

## 2016-06-02 DIAGNOSIS — N2581 Secondary hyperparathyroidism of renal origin: Secondary | ICD-10-CM | POA: Diagnosis not present

## 2016-06-02 DIAGNOSIS — D509 Iron deficiency anemia, unspecified: Secondary | ICD-10-CM | POA: Diagnosis not present

## 2016-06-02 DIAGNOSIS — E1129 Type 2 diabetes mellitus with other diabetic kidney complication: Secondary | ICD-10-CM | POA: Diagnosis not present

## 2016-06-02 DIAGNOSIS — N186 End stage renal disease: Secondary | ICD-10-CM | POA: Diagnosis not present

## 2016-06-02 DIAGNOSIS — D631 Anemia in chronic kidney disease: Secondary | ICD-10-CM | POA: Diagnosis not present

## 2016-06-03 DIAGNOSIS — I120 Hypertensive chronic kidney disease with stage 5 chronic kidney disease or end stage renal disease: Secondary | ICD-10-CM | POA: Diagnosis not present

## 2016-06-03 DIAGNOSIS — N186 End stage renal disease: Secondary | ICD-10-CM | POA: Diagnosis not present

## 2016-06-03 DIAGNOSIS — E1122 Type 2 diabetes mellitus with diabetic chronic kidney disease: Secondary | ICD-10-CM | POA: Diagnosis not present

## 2016-06-03 DIAGNOSIS — E1151 Type 2 diabetes mellitus with diabetic peripheral angiopathy without gangrene: Secondary | ICD-10-CM | POA: Diagnosis not present

## 2016-06-03 DIAGNOSIS — Z4781 Encounter for orthopedic aftercare following surgical amputation: Secondary | ICD-10-CM | POA: Diagnosis not present

## 2016-06-03 DIAGNOSIS — E11319 Type 2 diabetes mellitus with unspecified diabetic retinopathy without macular edema: Secondary | ICD-10-CM | POA: Diagnosis not present

## 2016-06-04 DIAGNOSIS — N2581 Secondary hyperparathyroidism of renal origin: Secondary | ICD-10-CM | POA: Diagnosis not present

## 2016-06-04 DIAGNOSIS — N186 End stage renal disease: Secondary | ICD-10-CM | POA: Diagnosis not present

## 2016-06-04 DIAGNOSIS — D509 Iron deficiency anemia, unspecified: Secondary | ICD-10-CM | POA: Diagnosis not present

## 2016-06-04 DIAGNOSIS — D631 Anemia in chronic kidney disease: Secondary | ICD-10-CM | POA: Diagnosis not present

## 2016-06-04 DIAGNOSIS — E1129 Type 2 diabetes mellitus with other diabetic kidney complication: Secondary | ICD-10-CM | POA: Diagnosis not present

## 2016-06-05 DIAGNOSIS — E1151 Type 2 diabetes mellitus with diabetic peripheral angiopathy without gangrene: Secondary | ICD-10-CM | POA: Diagnosis not present

## 2016-06-05 DIAGNOSIS — Z992 Dependence on renal dialysis: Secondary | ICD-10-CM | POA: Diagnosis not present

## 2016-06-05 DIAGNOSIS — I120 Hypertensive chronic kidney disease with stage 5 chronic kidney disease or end stage renal disease: Secondary | ICD-10-CM | POA: Diagnosis not present

## 2016-06-05 DIAGNOSIS — Z4781 Encounter for orthopedic aftercare following surgical amputation: Secondary | ICD-10-CM | POA: Diagnosis not present

## 2016-06-05 DIAGNOSIS — E11319 Type 2 diabetes mellitus with unspecified diabetic retinopathy without macular edema: Secondary | ICD-10-CM | POA: Diagnosis not present

## 2016-06-05 DIAGNOSIS — E1122 Type 2 diabetes mellitus with diabetic chronic kidney disease: Secondary | ICD-10-CM | POA: Diagnosis not present

## 2016-06-05 DIAGNOSIS — T82848A Pain from vascular prosthetic devices, implants and grafts, initial encounter: Secondary | ICD-10-CM | POA: Diagnosis not present

## 2016-06-05 DIAGNOSIS — N186 End stage renal disease: Secondary | ICD-10-CM | POA: Diagnosis not present

## 2016-06-06 DIAGNOSIS — D509 Iron deficiency anemia, unspecified: Secondary | ICD-10-CM | POA: Diagnosis not present

## 2016-06-06 DIAGNOSIS — D631 Anemia in chronic kidney disease: Secondary | ICD-10-CM | POA: Diagnosis not present

## 2016-06-06 DIAGNOSIS — E1129 Type 2 diabetes mellitus with other diabetic kidney complication: Secondary | ICD-10-CM | POA: Diagnosis not present

## 2016-06-06 DIAGNOSIS — N186 End stage renal disease: Secondary | ICD-10-CM | POA: Diagnosis not present

## 2016-06-06 DIAGNOSIS — N2581 Secondary hyperparathyroidism of renal origin: Secondary | ICD-10-CM | POA: Diagnosis not present

## 2016-06-08 ENCOUNTER — Telehealth: Payer: Self-pay | Admitting: *Deleted

## 2016-06-08 ENCOUNTER — Ambulatory Visit (INDEPENDENT_AMBULATORY_CARE_PROVIDER_SITE_OTHER): Payer: Medicare Other | Admitting: Podiatry

## 2016-06-08 DIAGNOSIS — E08621 Diabetes mellitus due to underlying condition with foot ulcer: Secondary | ICD-10-CM | POA: Diagnosis not present

## 2016-06-08 DIAGNOSIS — N186 End stage renal disease: Secondary | ICD-10-CM | POA: Diagnosis not present

## 2016-06-08 DIAGNOSIS — I70235 Atherosclerosis of native arteries of right leg with ulceration of other part of foot: Secondary | ICD-10-CM | POA: Diagnosis not present

## 2016-06-08 DIAGNOSIS — T8789 Other complications of amputation stump: Secondary | ICD-10-CM

## 2016-06-08 DIAGNOSIS — E1143 Type 2 diabetes mellitus with diabetic autonomic (poly)neuropathy: Secondary | ICD-10-CM

## 2016-06-08 DIAGNOSIS — L97509 Non-pressure chronic ulcer of other part of unspecified foot with unspecified severity: Secondary | ICD-10-CM | POA: Diagnosis not present

## 2016-06-08 DIAGNOSIS — E0843 Diabetes mellitus due to underlying condition with diabetic autonomic (poly)neuropathy: Secondary | ICD-10-CM

## 2016-06-08 DIAGNOSIS — E11319 Type 2 diabetes mellitus with unspecified diabetic retinopathy without macular edema: Secondary | ICD-10-CM | POA: Diagnosis not present

## 2016-06-08 DIAGNOSIS — E1122 Type 2 diabetes mellitus with diabetic chronic kidney disease: Secondary | ICD-10-CM | POA: Diagnosis not present

## 2016-06-08 DIAGNOSIS — E1151 Type 2 diabetes mellitus with diabetic peripheral angiopathy without gangrene: Secondary | ICD-10-CM | POA: Diagnosis not present

## 2016-06-08 DIAGNOSIS — Z4781 Encounter for orthopedic aftercare following surgical amputation: Secondary | ICD-10-CM | POA: Diagnosis not present

## 2016-06-08 DIAGNOSIS — I120 Hypertensive chronic kidney disease with stage 5 chronic kidney disease or end stage renal disease: Secondary | ICD-10-CM | POA: Diagnosis not present

## 2016-06-08 NOTE — Progress Notes (Signed)
Subjective:  Patient with a history of diabetes mellitus presents today for follow-up evaluation of a transmetatarsal amputation to the right foot. Date of surgery was 03/13/2016. After transmetatarsal amputation with primary closure there was extensive wound dehiscence to the lateral aspect of the incision site. At that time wound care was initiated and home health dressing changes are currently being provided. Patient states that he was last seen by vascular on 06/05/2016 for peripheral vascular disease.   Objective/Physical Exam General: The patient is alert and oriented x3 in no acute distress.  Dermatology:  Wound #1 noted to the transmetatarsal amputation stump lateral measuring [phone] cm (LxWxD).   To the noted ulceration(s), there is no eschar. There is an extensive amount of slough, fibrin, and necrotic tissue noted. Granulation tissue and wound base is red. There is a minimal amount of serosanguineous drainage noted. There is no exposed bone. There is exposed muscle and tendon. There is malodor. Periwound integrity is intact. Skin is warm, dry and supple bilateral lower extremities.  Vascular: Severe peripheral vascular disease being managed by vascular.  Neurological: Epicritic and protective threshold absent bilaterally.   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 #2 medically necessary excisional debridement including muscle and deep fascial tissue was performed using a tissue nipper and a chisel blade. Excisional debridement of all the necrotic nonviable tissue down to healthy bleeding viable tissue was performed with post-debridement measurements same as pre-. #3 the wound was cleansed and dry sterile dressing applied. #4 orders for a wound VAC were placed  today  #5 patient is to return to clinic in 2 weeks.   Edrick Kins, DPM [facility]  Dr. Edrick Kins, [place]                                        [place], [place] [address]                Office [phone]  Fax [phone]

## 2016-06-08 NOTE — Telephone Encounter (Addendum)
-----   Message from [name], DPM sent at 06/08/2016  2:11 PM EST ----- Regarding: Orders for wound vac [name],   Could we approve patient for wound vac? Patient is homebound.   Dx : s/p TMA right foot with dehiscence. Diabetes mellitus.  Wound measures 6.0x3.0x1.5cm (LxWxD).   Thanks,  Dr. Amalia [name]. I spoke with [name] - [facility] and informed we were ordering Negative pressure wound vac and to continue the current wound care until wound vac arrived. [name] - [facility] states Dr. Amalia [name] will need to place the wound vac initially, then they will continue the care. Dr. Amalia [name] states have pt bring in the wound vac and supplies at a 1 week visit and he will place the wound vac. Faxed required form and demographics to [employer]. I informed pt I had started the process of the wound vac that Dr. Amalia [name] had ordered, that it would be sent to his house and once it had arrived I needed him to call me and I would talk to him about the next stage. Pt states understanding. 06/09/2016-[name] states needs clinicals to support orders and measurements. LOV 06/08/2016 faxed to [employer]. Pt called for pain medication. 06/10/2016-[name] - [employer] states pt does not want wound vac brought to his home he would like it to be in the [place] office for his appt. I told pt,Dr. Amalia [name] ordered Vicodin 5/325, and he would need to pick it up in the [place] office and I needed to transfer him to the schedulers to get an appt for 06/15/2016. Pt states understanding and I transferred to schedulers. [name] - [employer] states he would like to speak and instruct pt on the wound vac and to have pt sign his required forms. I informed [name] of pt's appt 06/15/2016 at 8:15am.

## 2016-06-09 DIAGNOSIS — D631 Anemia in chronic kidney disease: Secondary | ICD-10-CM | POA: Diagnosis not present

## 2016-06-09 DIAGNOSIS — N2581 Secondary hyperparathyroidism of renal origin: Secondary | ICD-10-CM | POA: Diagnosis not present

## 2016-06-09 DIAGNOSIS — E1129 Type 2 diabetes mellitus with other diabetic kidney complication: Secondary | ICD-10-CM | POA: Diagnosis not present

## 2016-06-09 DIAGNOSIS — D509 Iron deficiency anemia, unspecified: Secondary | ICD-10-CM | POA: Diagnosis not present

## 2016-06-09 DIAGNOSIS — N186 End stage renal disease: Secondary | ICD-10-CM | POA: Diagnosis not present

## 2016-06-09 NOTE — Telephone Encounter (Signed)
Sure that is fine. Vicodin 5/325mg  #20 q6h. Thanks, Dr. Amalia Hailey

## 2016-06-10 DIAGNOSIS — I120 Hypertensive chronic kidney disease with stage 5 chronic kidney disease or end stage renal disease: Secondary | ICD-10-CM | POA: Diagnosis not present

## 2016-06-10 DIAGNOSIS — Z4781 Encounter for orthopedic aftercare following surgical amputation: Secondary | ICD-10-CM | POA: Diagnosis not present

## 2016-06-10 DIAGNOSIS — N186 End stage renal disease: Secondary | ICD-10-CM | POA: Diagnosis not present

## 2016-06-10 DIAGNOSIS — E1151 Type 2 diabetes mellitus with diabetic peripheral angiopathy without gangrene: Secondary | ICD-10-CM | POA: Diagnosis not present

## 2016-06-10 DIAGNOSIS — E1122 Type 2 diabetes mellitus with diabetic chronic kidney disease: Secondary | ICD-10-CM | POA: Diagnosis not present

## 2016-06-10 DIAGNOSIS — E11319 Type 2 diabetes mellitus with unspecified diabetic retinopathy without macular edema: Secondary | ICD-10-CM | POA: Diagnosis not present

## 2016-06-10 MED ORDER — HYDROCODONE-ACETAMINOPHEN 5-325 MG PO TABS: 1.0000 | ORAL_TABLET | Freq: Four times a day (QID) | ORAL | 0 refills | Status: DC | PRN

## 2016-06-11 DIAGNOSIS — D509 Iron deficiency anemia, unspecified: Secondary | ICD-10-CM | POA: Diagnosis not present

## 2016-06-11 DIAGNOSIS — D631 Anemia in chronic kidney disease: Secondary | ICD-10-CM | POA: Diagnosis not present

## 2016-06-11 DIAGNOSIS — E1129 Type 2 diabetes mellitus with other diabetic kidney complication: Secondary | ICD-10-CM | POA: Diagnosis not present

## 2016-06-11 DIAGNOSIS — N186 End stage renal disease: Secondary | ICD-10-CM | POA: Diagnosis not present

## 2016-06-11 DIAGNOSIS — N2581 Secondary hyperparathyroidism of renal origin: Secondary | ICD-10-CM | POA: Diagnosis not present

## 2016-06-12 DIAGNOSIS — N186 End stage renal disease: Secondary | ICD-10-CM | POA: Diagnosis not present

## 2016-06-12 DIAGNOSIS — E1122 Type 2 diabetes mellitus with diabetic chronic kidney disease: Secondary | ICD-10-CM | POA: Diagnosis not present

## 2016-06-12 DIAGNOSIS — I120 Hypertensive chronic kidney disease with stage 5 chronic kidney disease or end stage renal disease: Secondary | ICD-10-CM | POA: Diagnosis not present

## 2016-06-12 DIAGNOSIS — E1151 Type 2 diabetes mellitus with diabetic peripheral angiopathy without gangrene: Secondary | ICD-10-CM | POA: Diagnosis not present

## 2016-06-12 DIAGNOSIS — Z4781 Encounter for orthopedic aftercare following surgical amputation: Secondary | ICD-10-CM | POA: Diagnosis not present

## 2016-06-12 DIAGNOSIS — E11319 Type 2 diabetes mellitus with unspecified diabetic retinopathy without macular edema: Secondary | ICD-10-CM | POA: Diagnosis not present

## 2016-06-13 DIAGNOSIS — D509 Iron deficiency anemia, unspecified: Secondary | ICD-10-CM | POA: Diagnosis not present

## 2016-06-13 DIAGNOSIS — N2581 Secondary hyperparathyroidism of renal origin: Secondary | ICD-10-CM | POA: Diagnosis not present

## 2016-06-13 DIAGNOSIS — N186 End stage renal disease: Secondary | ICD-10-CM | POA: Diagnosis not present

## 2016-06-13 DIAGNOSIS — E1129 Type 2 diabetes mellitus with other diabetic kidney complication: Secondary | ICD-10-CM | POA: Diagnosis not present

## 2016-06-13 DIAGNOSIS — D631 Anemia in chronic kidney disease: Secondary | ICD-10-CM | POA: Diagnosis not present

## 2016-06-15 ENCOUNTER — Ambulatory Visit (INDEPENDENT_AMBULATORY_CARE_PROVIDER_SITE_OTHER): Payer: Medicare Other | Admitting: Podiatry

## 2016-06-15 DIAGNOSIS — I70235 Atherosclerosis of native arteries of right leg with ulceration of other part of foot: Secondary | ICD-10-CM | POA: Diagnosis not present

## 2016-06-15 DIAGNOSIS — T8789 Other complications of amputation stump: Secondary | ICD-10-CM | POA: Diagnosis not present

## 2016-06-15 DIAGNOSIS — L97509 Non-pressure chronic ulcer of other part of unspecified foot with unspecified severity: Secondary | ICD-10-CM | POA: Diagnosis not present

## 2016-06-15 DIAGNOSIS — E1143 Type 2 diabetes mellitus with diabetic autonomic (poly)neuropathy: Secondary | ICD-10-CM

## 2016-06-15 DIAGNOSIS — E0843 Diabetes mellitus due to underlying condition with diabetic autonomic (poly)neuropathy: Secondary | ICD-10-CM | POA: Diagnosis not present

## 2016-06-15 NOTE — Progress Notes (Signed)
Subjective:  Patient with a history of diabetes mellitus presents today for follow-up evaluation of a transmetatarsal amputation to the right foot. Date of surgery was 03/13/2016. After transmetatarsal amputation with primary closure there was extensive wound dehiscence to the lateral aspect of the incision site. At that time wound care was initiated and home health dressing changes are currently being provided. Patient states that he was last seen by vascular on 06/05/2016 for peripheral vascular disease.   Objective/Physical Exam General: The patient is alert and oriented x3 in no acute distress.  Dermatology:  Wound #1 noted to the transmetatarsal amputation stump lateral measuring [phone] cm (LxWxD).   To the noted ulceration(s), there is no eschar. There is an extensive amount of slough, fibrin, and necrotic tissue noted. Granulation tissue and wound base is red. There is a minimal amount of serosanguineous drainage noted. There is no exposed bone. There is exposed muscle and tendon. There is malodor. Periwound integrity is intact. Skin is warm, dry and supple bilateral lower extremities.  Vascular: Severe peripheral vascular disease being managed by vascular.  Neurological: Epicritic and protective threshold absent bilaterally.   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 #2 medically necessary excisional debridement including muscle and deep fascial tissue was performed using a tissue nipper and a chisel blade. Excisional debridement of all the necrotic nonviable tissue down to healthy bleeding viable tissue was performed with post-debridement measurements same as pre-. #3 the wound was cleansed and dry sterile dressing applied. #4 today negative pressure wound VAC  therapy was applied to the ulceration site. The representative from the wound [place] was present as well. Instructions were provided to patient regarding wound VAC therapy. #5 return to clinic in 2 weeks.   Edrick Kins, DPM [facility]  Dr. Edrick Kins, [place]                                        [place], [place] [address]                Office [phone]  Fax [phone]

## 2016-06-16 DIAGNOSIS — E1151 Type 2 diabetes mellitus with diabetic peripheral angiopathy without gangrene: Secondary | ICD-10-CM | POA: Diagnosis not present

## 2016-06-16 DIAGNOSIS — I120 Hypertensive chronic kidney disease with stage 5 chronic kidney disease or end stage renal disease: Secondary | ICD-10-CM | POA: Diagnosis not present

## 2016-06-16 DIAGNOSIS — Z4781 Encounter for orthopedic aftercare following surgical amputation: Secondary | ICD-10-CM | POA: Diagnosis not present

## 2016-06-16 DIAGNOSIS — E11319 Type 2 diabetes mellitus with unspecified diabetic retinopathy without macular edema: Secondary | ICD-10-CM | POA: Diagnosis not present

## 2016-06-16 DIAGNOSIS — N186 End stage renal disease: Secondary | ICD-10-CM | POA: Diagnosis not present

## 2016-06-16 DIAGNOSIS — E1122 Type 2 diabetes mellitus with diabetic chronic kidney disease: Secondary | ICD-10-CM | POA: Diagnosis not present

## 2016-06-16 DIAGNOSIS — D509 Iron deficiency anemia, unspecified: Secondary | ICD-10-CM | POA: Diagnosis not present

## 2016-06-16 DIAGNOSIS — E1129 Type 2 diabetes mellitus with other diabetic kidney complication: Secondary | ICD-10-CM | POA: Diagnosis not present

## 2016-06-16 DIAGNOSIS — D631 Anemia in chronic kidney disease: Secondary | ICD-10-CM | POA: Diagnosis not present

## 2016-06-16 DIAGNOSIS — N2581 Secondary hyperparathyroidism of renal origin: Secondary | ICD-10-CM | POA: Diagnosis not present

## 2016-06-18 DIAGNOSIS — E11319 Type 2 diabetes mellitus with unspecified diabetic retinopathy without macular edema: Secondary | ICD-10-CM | POA: Diagnosis not present

## 2016-06-18 DIAGNOSIS — M6281 Muscle weakness (generalized): Secondary | ICD-10-CM | POA: Diagnosis not present

## 2016-06-18 DIAGNOSIS — Z992 Dependence on renal dialysis: Secondary | ICD-10-CM | POA: Diagnosis not present

## 2016-06-18 DIAGNOSIS — E785 Hyperlipidemia, unspecified: Secondary | ICD-10-CM | POA: Diagnosis not present

## 2016-06-18 DIAGNOSIS — Z4781 Encounter for orthopedic aftercare following surgical amputation: Secondary | ICD-10-CM | POA: Diagnosis not present

## 2016-06-18 DIAGNOSIS — N186 End stage renal disease: Secondary | ICD-10-CM | POA: Diagnosis not present

## 2016-06-18 DIAGNOSIS — Z7901 Long term (current) use of anticoagulants: Secondary | ICD-10-CM | POA: Diagnosis not present

## 2016-06-18 DIAGNOSIS — D509 Iron deficiency anemia, unspecified: Secondary | ICD-10-CM | POA: Diagnosis not present

## 2016-06-18 DIAGNOSIS — Z86718 Personal history of other venous thrombosis and embolism: Secondary | ICD-10-CM | POA: Diagnosis not present

## 2016-06-18 DIAGNOSIS — E1129 Type 2 diabetes mellitus with other diabetic kidney complication: Secondary | ICD-10-CM | POA: Diagnosis not present

## 2016-06-18 DIAGNOSIS — E1151 Type 2 diabetes mellitus with diabetic peripheral angiopathy without gangrene: Secondary | ICD-10-CM | POA: Diagnosis not present

## 2016-06-18 DIAGNOSIS — N2581 Secondary hyperparathyroidism of renal origin: Secondary | ICD-10-CM | POA: Diagnosis not present

## 2016-06-18 DIAGNOSIS — Z89421 Acquired absence of other right toe(s): Secondary | ICD-10-CM | POA: Diagnosis not present

## 2016-06-18 DIAGNOSIS — Z8572 Personal history of non-Hodgkin lymphomas: Secondary | ICD-10-CM | POA: Diagnosis not present

## 2016-06-18 DIAGNOSIS — D631 Anemia in chronic kidney disease: Secondary | ICD-10-CM | POA: Diagnosis not present

## 2016-06-18 DIAGNOSIS — Z794 Long term (current) use of insulin: Secondary | ICD-10-CM | POA: Diagnosis not present

## 2016-06-18 DIAGNOSIS — E1122 Type 2 diabetes mellitus with diabetic chronic kidney disease: Secondary | ICD-10-CM | POA: Diagnosis not present

## 2016-06-18 DIAGNOSIS — I120 Hypertensive chronic kidney disease with stage 5 chronic kidney disease or end stage renal disease: Secondary | ICD-10-CM | POA: Diagnosis not present

## 2016-06-19 DIAGNOSIS — N186 End stage renal disease: Secondary | ICD-10-CM | POA: Diagnosis not present

## 2016-06-19 DIAGNOSIS — E1151 Type 2 diabetes mellitus with diabetic peripheral angiopathy without gangrene: Secondary | ICD-10-CM | POA: Diagnosis not present

## 2016-06-19 DIAGNOSIS — E1122 Type 2 diabetes mellitus with diabetic chronic kidney disease: Secondary | ICD-10-CM | POA: Diagnosis not present

## 2016-06-19 DIAGNOSIS — Z4781 Encounter for orthopedic aftercare following surgical amputation: Secondary | ICD-10-CM | POA: Diagnosis not present

## 2016-06-19 DIAGNOSIS — E11319 Type 2 diabetes mellitus with unspecified diabetic retinopathy without macular edema: Secondary | ICD-10-CM | POA: Diagnosis not present

## 2016-06-19 DIAGNOSIS — I120 Hypertensive chronic kidney disease with stage 5 chronic kidney disease or end stage renal disease: Secondary | ICD-10-CM | POA: Diagnosis not present

## 2016-06-20 DIAGNOSIS — D631 Anemia in chronic kidney disease: Secondary | ICD-10-CM | POA: Diagnosis not present

## 2016-06-20 DIAGNOSIS — N186 End stage renal disease: Secondary | ICD-10-CM | POA: Diagnosis not present

## 2016-06-20 DIAGNOSIS — E1129 Type 2 diabetes mellitus with other diabetic kidney complication: Secondary | ICD-10-CM | POA: Diagnosis not present

## 2016-06-20 DIAGNOSIS — D509 Iron deficiency anemia, unspecified: Secondary | ICD-10-CM | POA: Diagnosis not present

## 2016-06-20 DIAGNOSIS — N2581 Secondary hyperparathyroidism of renal origin: Secondary | ICD-10-CM | POA: Diagnosis not present

## 2016-06-22 ENCOUNTER — Ambulatory Visit: Payer: Medicare Other | Admitting: Podiatry

## 2016-06-22 DIAGNOSIS — E1122 Type 2 diabetes mellitus with diabetic chronic kidney disease: Secondary | ICD-10-CM | POA: Diagnosis not present

## 2016-06-22 DIAGNOSIS — I120 Hypertensive chronic kidney disease with stage 5 chronic kidney disease or end stage renal disease: Secondary | ICD-10-CM | POA: Diagnosis not present

## 2016-06-22 DIAGNOSIS — Z4781 Encounter for orthopedic aftercare following surgical amputation: Secondary | ICD-10-CM | POA: Diagnosis not present

## 2016-06-22 DIAGNOSIS — E1151 Type 2 diabetes mellitus with diabetic peripheral angiopathy without gangrene: Secondary | ICD-10-CM | POA: Diagnosis not present

## 2016-06-22 DIAGNOSIS — N186 End stage renal disease: Secondary | ICD-10-CM | POA: Diagnosis not present

## 2016-06-22 DIAGNOSIS — E11319 Type 2 diabetes mellitus with unspecified diabetic retinopathy without macular edema: Secondary | ICD-10-CM | POA: Diagnosis not present

## 2016-06-23 DIAGNOSIS — D509 Iron deficiency anemia, unspecified: Secondary | ICD-10-CM | POA: Diagnosis not present

## 2016-06-23 DIAGNOSIS — N186 End stage renal disease: Secondary | ICD-10-CM | POA: Diagnosis not present

## 2016-06-23 DIAGNOSIS — E1129 Type 2 diabetes mellitus with other diabetic kidney complication: Secondary | ICD-10-CM | POA: Diagnosis not present

## 2016-06-23 DIAGNOSIS — D631 Anemia in chronic kidney disease: Secondary | ICD-10-CM | POA: Diagnosis not present

## 2016-06-23 DIAGNOSIS — N2581 Secondary hyperparathyroidism of renal origin: Secondary | ICD-10-CM | POA: Diagnosis not present

## 2016-06-24 DIAGNOSIS — N186 End stage renal disease: Secondary | ICD-10-CM | POA: Diagnosis not present

## 2016-06-24 DIAGNOSIS — Z4781 Encounter for orthopedic aftercare following surgical amputation: Secondary | ICD-10-CM | POA: Diagnosis not present

## 2016-06-24 DIAGNOSIS — E1151 Type 2 diabetes mellitus with diabetic peripheral angiopathy without gangrene: Secondary | ICD-10-CM | POA: Diagnosis not present

## 2016-06-24 DIAGNOSIS — I120 Hypertensive chronic kidney disease with stage 5 chronic kidney disease or end stage renal disease: Secondary | ICD-10-CM | POA: Diagnosis not present

## 2016-06-24 DIAGNOSIS — E1122 Type 2 diabetes mellitus with diabetic chronic kidney disease: Secondary | ICD-10-CM | POA: Diagnosis not present

## 2016-06-24 DIAGNOSIS — E11319 Type 2 diabetes mellitus with unspecified diabetic retinopathy without macular edema: Secondary | ICD-10-CM | POA: Diagnosis not present

## 2016-06-25 DIAGNOSIS — D631 Anemia in chronic kidney disease: Secondary | ICD-10-CM | POA: Diagnosis not present

## 2016-06-25 DIAGNOSIS — D509 Iron deficiency anemia, unspecified: Secondary | ICD-10-CM | POA: Diagnosis not present

## 2016-06-25 DIAGNOSIS — N186 End stage renal disease: Secondary | ICD-10-CM | POA: Diagnosis not present

## 2016-06-25 DIAGNOSIS — N2581 Secondary hyperparathyroidism of renal origin: Secondary | ICD-10-CM | POA: Diagnosis not present

## 2016-06-25 DIAGNOSIS — E1129 Type 2 diabetes mellitus with other diabetic kidney complication: Secondary | ICD-10-CM | POA: Diagnosis not present

## 2016-06-26 DIAGNOSIS — N186 End stage renal disease: Secondary | ICD-10-CM | POA: Diagnosis not present

## 2016-06-26 DIAGNOSIS — E11319 Type 2 diabetes mellitus with unspecified diabetic retinopathy without macular edema: Secondary | ICD-10-CM | POA: Diagnosis not present

## 2016-06-26 DIAGNOSIS — E1151 Type 2 diabetes mellitus with diabetic peripheral angiopathy without gangrene: Secondary | ICD-10-CM | POA: Diagnosis not present

## 2016-06-26 DIAGNOSIS — E1122 Type 2 diabetes mellitus with diabetic chronic kidney disease: Secondary | ICD-10-CM | POA: Diagnosis not present

## 2016-06-26 DIAGNOSIS — Z4781 Encounter for orthopedic aftercare following surgical amputation: Secondary | ICD-10-CM | POA: Diagnosis not present

## 2016-06-26 DIAGNOSIS — I120 Hypertensive chronic kidney disease with stage 5 chronic kidney disease or end stage renal disease: Secondary | ICD-10-CM | POA: Diagnosis not present

## 2016-06-27 DIAGNOSIS — E1129 Type 2 diabetes mellitus with other diabetic kidney complication: Secondary | ICD-10-CM | POA: Diagnosis not present

## 2016-06-27 DIAGNOSIS — D509 Iron deficiency anemia, unspecified: Secondary | ICD-10-CM | POA: Diagnosis not present

## 2016-06-27 DIAGNOSIS — D631 Anemia in chronic kidney disease: Secondary | ICD-10-CM | POA: Diagnosis not present

## 2016-06-27 DIAGNOSIS — N2581 Secondary hyperparathyroidism of renal origin: Secondary | ICD-10-CM | POA: Diagnosis not present

## 2016-06-27 DIAGNOSIS — N186 End stage renal disease: Secondary | ICD-10-CM | POA: Diagnosis not present

## 2016-06-29 ENCOUNTER — Ambulatory Visit (INDEPENDENT_AMBULATORY_CARE_PROVIDER_SITE_OTHER): Payer: Medicare Other | Admitting: Podiatry

## 2016-06-29 ENCOUNTER — Encounter: Payer: Self-pay | Admitting: Podiatry

## 2016-06-29 ENCOUNTER — Ambulatory Visit (INDEPENDENT_AMBULATORY_CARE_PROVIDER_SITE_OTHER): Payer: Medicare Other

## 2016-06-29 VITALS — BP 93/51 | HR 93 | Temp 97.2°F | Resp 16

## 2016-06-29 DIAGNOSIS — N186 End stage renal disease: Secondary | ICD-10-CM | POA: Diagnosis not present

## 2016-06-29 DIAGNOSIS — I120 Hypertensive chronic kidney disease with stage 5 chronic kidney disease or end stage renal disease: Secondary | ICD-10-CM | POA: Diagnosis not present

## 2016-06-29 DIAGNOSIS — T8789 Other complications of amputation stump: Secondary | ICD-10-CM

## 2016-06-29 DIAGNOSIS — L97513 Non-pressure chronic ulcer of other part of right foot with necrosis of muscle: Secondary | ICD-10-CM | POA: Diagnosis not present

## 2016-06-29 DIAGNOSIS — E1151 Type 2 diabetes mellitus with diabetic peripheral angiopathy without gangrene: Secondary | ICD-10-CM | POA: Diagnosis not present

## 2016-06-29 DIAGNOSIS — I70235 Atherosclerosis of native arteries of right leg with ulceration of other part of foot: Secondary | ICD-10-CM | POA: Diagnosis not present

## 2016-06-29 DIAGNOSIS — Z89431 Acquired absence of right foot: Secondary | ICD-10-CM

## 2016-06-29 DIAGNOSIS — Z4781 Encounter for orthopedic aftercare following surgical amputation: Secondary | ICD-10-CM | POA: Diagnosis not present

## 2016-06-29 DIAGNOSIS — L97509 Non-pressure chronic ulcer of other part of unspecified foot with unspecified severity: Secondary | ICD-10-CM | POA: Diagnosis not present

## 2016-06-29 DIAGNOSIS — E1122 Type 2 diabetes mellitus with diabetic chronic kidney disease: Secondary | ICD-10-CM | POA: Diagnosis not present

## 2016-06-29 DIAGNOSIS — E11319 Type 2 diabetes mellitus with unspecified diabetic retinopathy without macular edema: Secondary | ICD-10-CM | POA: Diagnosis not present

## 2016-06-30 DIAGNOSIS — D509 Iron deficiency anemia, unspecified: Secondary | ICD-10-CM | POA: Diagnosis not present

## 2016-06-30 DIAGNOSIS — N186 End stage renal disease: Secondary | ICD-10-CM | POA: Diagnosis not present

## 2016-06-30 DIAGNOSIS — D631 Anemia in chronic kidney disease: Secondary | ICD-10-CM | POA: Diagnosis not present

## 2016-06-30 DIAGNOSIS — N2581 Secondary hyperparathyroidism of renal origin: Secondary | ICD-10-CM | POA: Diagnosis not present

## 2016-06-30 DIAGNOSIS — E1129 Type 2 diabetes mellitus with other diabetic kidney complication: Secondary | ICD-10-CM | POA: Diagnosis not present

## 2016-07-01 DIAGNOSIS — Z4781 Encounter for orthopedic aftercare following surgical amputation: Secondary | ICD-10-CM | POA: Diagnosis not present

## 2016-07-01 DIAGNOSIS — E1129 Type 2 diabetes mellitus with other diabetic kidney complication: Secondary | ICD-10-CM | POA: Diagnosis not present

## 2016-07-01 DIAGNOSIS — E1151 Type 2 diabetes mellitus with diabetic peripheral angiopathy without gangrene: Secondary | ICD-10-CM | POA: Diagnosis not present

## 2016-07-01 DIAGNOSIS — Z992 Dependence on renal dialysis: Secondary | ICD-10-CM | POA: Diagnosis not present

## 2016-07-01 DIAGNOSIS — E1122 Type 2 diabetes mellitus with diabetic chronic kidney disease: Secondary | ICD-10-CM | POA: Diagnosis not present

## 2016-07-01 DIAGNOSIS — N186 End stage renal disease: Secondary | ICD-10-CM | POA: Diagnosis not present

## 2016-07-01 DIAGNOSIS — E11319 Type 2 diabetes mellitus with unspecified diabetic retinopathy without macular edema: Secondary | ICD-10-CM | POA: Diagnosis not present

## 2016-07-01 DIAGNOSIS — I120 Hypertensive chronic kidney disease with stage 5 chronic kidney disease or end stage renal disease: Secondary | ICD-10-CM | POA: Diagnosis not present

## 2016-07-02 DIAGNOSIS — N2581 Secondary hyperparathyroidism of renal origin: Secondary | ICD-10-CM | POA: Diagnosis not present

## 2016-07-02 DIAGNOSIS — D631 Anemia in chronic kidney disease: Secondary | ICD-10-CM | POA: Diagnosis not present

## 2016-07-02 DIAGNOSIS — E1129 Type 2 diabetes mellitus with other diabetic kidney complication: Secondary | ICD-10-CM | POA: Diagnosis not present

## 2016-07-02 DIAGNOSIS — N186 End stage renal disease: Secondary | ICD-10-CM | POA: Diagnosis not present

## 2016-07-03 DIAGNOSIS — Z4781 Encounter for orthopedic aftercare following surgical amputation: Secondary | ICD-10-CM | POA: Diagnosis not present

## 2016-07-03 DIAGNOSIS — E11319 Type 2 diabetes mellitus with unspecified diabetic retinopathy without macular edema: Secondary | ICD-10-CM | POA: Diagnosis not present

## 2016-07-03 DIAGNOSIS — I120 Hypertensive chronic kidney disease with stage 5 chronic kidney disease or end stage renal disease: Secondary | ICD-10-CM | POA: Diagnosis not present

## 2016-07-03 DIAGNOSIS — E1122 Type 2 diabetes mellitus with diabetic chronic kidney disease: Secondary | ICD-10-CM | POA: Diagnosis not present

## 2016-07-03 DIAGNOSIS — E1151 Type 2 diabetes mellitus with diabetic peripheral angiopathy without gangrene: Secondary | ICD-10-CM | POA: Diagnosis not present

## 2016-07-03 DIAGNOSIS — N186 End stage renal disease: Secondary | ICD-10-CM | POA: Diagnosis not present

## 2016-07-04 DIAGNOSIS — N186 End stage renal disease: Secondary | ICD-10-CM | POA: Diagnosis not present

## 2016-07-04 DIAGNOSIS — E1129 Type 2 diabetes mellitus with other diabetic kidney complication: Secondary | ICD-10-CM | POA: Diagnosis not present

## 2016-07-04 DIAGNOSIS — D631 Anemia in chronic kidney disease: Secondary | ICD-10-CM | POA: Diagnosis not present

## 2016-07-04 DIAGNOSIS — N2581 Secondary hyperparathyroidism of renal origin: Secondary | ICD-10-CM | POA: Diagnosis not present

## 2016-07-06 DIAGNOSIS — E1151 Type 2 diabetes mellitus with diabetic peripheral angiopathy without gangrene: Secondary | ICD-10-CM | POA: Diagnosis not present

## 2016-07-06 DIAGNOSIS — E11319 Type 2 diabetes mellitus with unspecified diabetic retinopathy without macular edema: Secondary | ICD-10-CM | POA: Diagnosis not present

## 2016-07-06 DIAGNOSIS — N186 End stage renal disease: Secondary | ICD-10-CM | POA: Diagnosis not present

## 2016-07-06 DIAGNOSIS — I120 Hypertensive chronic kidney disease with stage 5 chronic kidney disease or end stage renal disease: Secondary | ICD-10-CM | POA: Diagnosis not present

## 2016-07-06 DIAGNOSIS — Z4781 Encounter for orthopedic aftercare following surgical amputation: Secondary | ICD-10-CM | POA: Diagnosis not present

## 2016-07-06 DIAGNOSIS — E1122 Type 2 diabetes mellitus with diabetic chronic kidney disease: Secondary | ICD-10-CM | POA: Diagnosis not present

## 2016-07-07 DIAGNOSIS — D631 Anemia in chronic kidney disease: Secondary | ICD-10-CM | POA: Diagnosis not present

## 2016-07-07 DIAGNOSIS — N2581 Secondary hyperparathyroidism of renal origin: Secondary | ICD-10-CM | POA: Diagnosis not present

## 2016-07-07 DIAGNOSIS — N186 End stage renal disease: Secondary | ICD-10-CM | POA: Diagnosis not present

## 2016-07-07 DIAGNOSIS — E1129 Type 2 diabetes mellitus with other diabetic kidney complication: Secondary | ICD-10-CM | POA: Diagnosis not present

## 2016-07-08 DIAGNOSIS — E1151 Type 2 diabetes mellitus with diabetic peripheral angiopathy without gangrene: Secondary | ICD-10-CM | POA: Diagnosis not present

## 2016-07-08 DIAGNOSIS — E1122 Type 2 diabetes mellitus with diabetic chronic kidney disease: Secondary | ICD-10-CM | POA: Diagnosis not present

## 2016-07-08 DIAGNOSIS — N186 End stage renal disease: Secondary | ICD-10-CM | POA: Diagnosis not present

## 2016-07-08 DIAGNOSIS — Z4781 Encounter for orthopedic aftercare following surgical amputation: Secondary | ICD-10-CM | POA: Diagnosis not present

## 2016-07-08 DIAGNOSIS — I770 Arteriovenous fistula, acquired: Secondary | ICD-10-CM | POA: Insufficient documentation

## 2016-07-08 DIAGNOSIS — I120 Hypertensive chronic kidney disease with stage 5 chronic kidney disease or end stage renal disease: Secondary | ICD-10-CM | POA: Diagnosis not present

## 2016-07-08 DIAGNOSIS — E11319 Type 2 diabetes mellitus with unspecified diabetic retinopathy without macular edema: Secondary | ICD-10-CM | POA: Diagnosis not present

## 2016-07-09 DIAGNOSIS — E1129 Type 2 diabetes mellitus with other diabetic kidney complication: Secondary | ICD-10-CM | POA: Diagnosis not present

## 2016-07-09 DIAGNOSIS — D631 Anemia in chronic kidney disease: Secondary | ICD-10-CM | POA: Diagnosis not present

## 2016-07-09 DIAGNOSIS — N2581 Secondary hyperparathyroidism of renal origin: Secondary | ICD-10-CM | POA: Diagnosis not present

## 2016-07-09 DIAGNOSIS — N186 End stage renal disease: Secondary | ICD-10-CM | POA: Diagnosis not present

## 2016-07-10 DIAGNOSIS — Z4781 Encounter for orthopedic aftercare following surgical amputation: Secondary | ICD-10-CM | POA: Diagnosis not present

## 2016-07-10 DIAGNOSIS — I120 Hypertensive chronic kidney disease with stage 5 chronic kidney disease or end stage renal disease: Secondary | ICD-10-CM | POA: Diagnosis not present

## 2016-07-10 DIAGNOSIS — E1122 Type 2 diabetes mellitus with diabetic chronic kidney disease: Secondary | ICD-10-CM | POA: Diagnosis not present

## 2016-07-10 DIAGNOSIS — R2689 Other abnormalities of gait and mobility: Secondary | ICD-10-CM | POA: Insufficient documentation

## 2016-07-10 DIAGNOSIS — E1151 Type 2 diabetes mellitus with diabetic peripheral angiopathy without gangrene: Secondary | ICD-10-CM | POA: Diagnosis not present

## 2016-07-10 DIAGNOSIS — E11319 Type 2 diabetes mellitus with unspecified diabetic retinopathy without macular edema: Secondary | ICD-10-CM | POA: Diagnosis not present

## 2016-07-10 DIAGNOSIS — N186 End stage renal disease: Secondary | ICD-10-CM | POA: Diagnosis not present

## 2016-07-11 DIAGNOSIS — N2581 Secondary hyperparathyroidism of renal origin: Secondary | ICD-10-CM | POA: Diagnosis not present

## 2016-07-11 DIAGNOSIS — N186 End stage renal disease: Secondary | ICD-10-CM | POA: Diagnosis not present

## 2016-07-11 DIAGNOSIS — D631 Anemia in chronic kidney disease: Secondary | ICD-10-CM | POA: Diagnosis not present

## 2016-07-11 DIAGNOSIS — E1129 Type 2 diabetes mellitus with other diabetic kidney complication: Secondary | ICD-10-CM | POA: Diagnosis not present

## 2016-07-11 NOTE — Progress Notes (Signed)
Subjective:  Patient with a history of diabetes mellitus presents today for follow-up evaluation of a transmetatarsal amputation to the right foot. Date of surgery was 03/13/2016. After transmetatarsal amputation with primary closure there was extensive wound dehiscence to the lateral aspect of the incision site. At that time wound care was initiated and home health dressing changes are currently being provided. Patient states that he was last seen by vascular on 06/05/2016 for peripheral vascular disease.   Objective/Physical Exam General: The patient is alert and oriented x3 in no acute distress.  Dermatology:  Wound #1 noted to the transmetatarsal amputation stump lateral measuring [phone] cm (LxWxD).   To the noted ulceration(s), there is no eschar. There is an extensive amount of slough, fibrin, and necrotic tissue noted. Granulation tissue and wound base is red. There is a minimal amount of serosanguineous drainage noted. There is no exposed bone. There is exposed muscle and tendon. There is malodor. Periwound integrity is intact. Skin is warm, dry and supple bilateral lower extremities.  Vascular: Severe peripheral vascular disease being managed by vascular.  Neurological: Epicritic and protective threshold absent bilaterally.   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 #2 medically necessary excisional debridement including muscle and deep fascial tissue was performed using a tissue nipper and a chisel blade. Excisional debridement of all the necrotic nonviable tissue down to healthy bleeding viable tissue was performed with post-debridement measurements same as pre-. #3 the wound was cleansed and dry sterile dressing applied. #4 today negative pressure wound VAC  therapy was applied to the ulceration site. Following sterile dressing. #5 return to clinic in 2 weeks.   Edrick Kins, DPM [facility]  Dr. Edrick Kins, [place]                                        [place], [place] [address]                Office [phone]  Fax [phone]

## 2016-07-13 ENCOUNTER — Ambulatory Visit (INDEPENDENT_AMBULATORY_CARE_PROVIDER_SITE_OTHER): Payer: Medicare Other | Admitting: Podiatry

## 2016-07-13 DIAGNOSIS — Z89431 Acquired absence of right foot: Secondary | ICD-10-CM

## 2016-07-13 DIAGNOSIS — L97512 Non-pressure chronic ulcer of other part of right foot with fat layer exposed: Secondary | ICD-10-CM | POA: Diagnosis not present

## 2016-07-13 DIAGNOSIS — T8789 Other complications of amputation stump: Secondary | ICD-10-CM

## 2016-07-13 DIAGNOSIS — L97509 Non-pressure chronic ulcer of other part of unspecified foot with unspecified severity: Secondary | ICD-10-CM

## 2016-07-13 DIAGNOSIS — I70235 Atherosclerosis of native arteries of right leg with ulceration of other part of foot: Secondary | ICD-10-CM

## 2016-07-13 NOTE — Progress Notes (Signed)
Subjective:  Patient with a history of diabetes mellitus presents today for follow-up evaluation of a transmetatarsal amputation to the right foot. Date of surgery was 03/13/2016. After transmetatarsal amputation with primary closure there was extensive wound dehiscence to the lateral aspect of the incision site. At that time wound care was initiated and home health dressing changes are currently being provided. Patient states that he was last seen by vascular on 06/05/2016 for peripheral vascular disease.   Objective/Physical Exam General: The patient is alert and oriented x3 in no acute distress.  Dermatology:  Wound #1 noted to the transmetatarsal amputation stump lateral measuring [phone] cm (LxWxD).   To the noted ulceration(s), there is no eschar. There is a moderate amount of slough, fibrin, and necrotic tissue noted. Granulation tissue and wound base is red. There is a moderate amount of serosanguineous drainage noted. There is no exposed bone. There is no exposed muscle and tendon. There is malodor likely due to maceration secondary to a wound that. Periwound integrity is intact. Skin is warm, dry and supple bilateral lower extremities.  Vascular: Severe peripheral vascular disease being managed by vascular.  Neurological: Epicritic and protective threshold absent bilaterally.   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 #2 medically necessary excisional debridement including muscle and deep fascial tissue was performed using a tissue nipper and a chisel blade. Excisional debridement of all the necrotic nonviable tissue down to healthy bleeding viable tissue was performed with post-debridement measurements same as pre-. #3 the wound was cleansed and dry sterile  dressing applied. #4 today negative pressure wound VAC therapy was applied to the ulceration site. Following sterile dressing. #5 return to clinic in 2 weeks.   Edrick Kins, DPM [facility]  Dr. Edrick Kins, [address]                                        [place], [place] [address]                Office [phone]  Fax [phone]

## 2016-07-14 DIAGNOSIS — N186 End stage renal disease: Secondary | ICD-10-CM | POA: Diagnosis not present

## 2016-07-14 DIAGNOSIS — E1129 Type 2 diabetes mellitus with other diabetic kidney complication: Secondary | ICD-10-CM | POA: Diagnosis not present

## 2016-07-14 DIAGNOSIS — N2581 Secondary hyperparathyroidism of renal origin: Secondary | ICD-10-CM | POA: Diagnosis not present

## 2016-07-14 DIAGNOSIS — D631 Anemia in chronic kidney disease: Secondary | ICD-10-CM | POA: Diagnosis not present

## 2016-07-15 DIAGNOSIS — E11319 Type 2 diabetes mellitus with unspecified diabetic retinopathy without macular edema: Secondary | ICD-10-CM | POA: Diagnosis not present

## 2016-07-15 DIAGNOSIS — E1151 Type 2 diabetes mellitus with diabetic peripheral angiopathy without gangrene: Secondary | ICD-10-CM | POA: Diagnosis not present

## 2016-07-15 DIAGNOSIS — I120 Hypertensive chronic kidney disease with stage 5 chronic kidney disease or end stage renal disease: Secondary | ICD-10-CM | POA: Diagnosis not present

## 2016-07-15 DIAGNOSIS — E1122 Type 2 diabetes mellitus with diabetic chronic kidney disease: Secondary | ICD-10-CM | POA: Diagnosis not present

## 2016-07-15 DIAGNOSIS — N186 End stage renal disease: Secondary | ICD-10-CM | POA: Diagnosis not present

## 2016-07-15 DIAGNOSIS — Z4781 Encounter for orthopedic aftercare following surgical amputation: Secondary | ICD-10-CM | POA: Diagnosis not present

## 2016-07-16 DIAGNOSIS — D631 Anemia in chronic kidney disease: Secondary | ICD-10-CM | POA: Diagnosis not present

## 2016-07-16 DIAGNOSIS — E1129 Type 2 diabetes mellitus with other diabetic kidney complication: Secondary | ICD-10-CM | POA: Diagnosis not present

## 2016-07-16 DIAGNOSIS — N186 End stage renal disease: Secondary | ICD-10-CM | POA: Diagnosis not present

## 2016-07-16 DIAGNOSIS — N2581 Secondary hyperparathyroidism of renal origin: Secondary | ICD-10-CM | POA: Diagnosis not present

## 2016-07-17 DIAGNOSIS — I120 Hypertensive chronic kidney disease with stage 5 chronic kidney disease or end stage renal disease: Secondary | ICD-10-CM | POA: Diagnosis not present

## 2016-07-17 DIAGNOSIS — E1122 Type 2 diabetes mellitus with diabetic chronic kidney disease: Secondary | ICD-10-CM | POA: Diagnosis not present

## 2016-07-17 DIAGNOSIS — E1151 Type 2 diabetes mellitus with diabetic peripheral angiopathy without gangrene: Secondary | ICD-10-CM | POA: Diagnosis not present

## 2016-07-17 DIAGNOSIS — N186 End stage renal disease: Secondary | ICD-10-CM | POA: Diagnosis not present

## 2016-07-17 DIAGNOSIS — Z4781 Encounter for orthopedic aftercare following surgical amputation: Secondary | ICD-10-CM | POA: Diagnosis not present

## 2016-07-17 DIAGNOSIS — E11319 Type 2 diabetes mellitus with unspecified diabetic retinopathy without macular edema: Secondary | ICD-10-CM | POA: Diagnosis not present

## 2016-07-18 DIAGNOSIS — N2581 Secondary hyperparathyroidism of renal origin: Secondary | ICD-10-CM | POA: Diagnosis not present

## 2016-07-18 DIAGNOSIS — D631 Anemia in chronic kidney disease: Secondary | ICD-10-CM | POA: Diagnosis not present

## 2016-07-18 DIAGNOSIS — E1129 Type 2 diabetes mellitus with other diabetic kidney complication: Secondary | ICD-10-CM | POA: Diagnosis not present

## 2016-07-18 DIAGNOSIS — N186 End stage renal disease: Secondary | ICD-10-CM | POA: Diagnosis not present

## 2016-07-20 DIAGNOSIS — N186 End stage renal disease: Secondary | ICD-10-CM | POA: Diagnosis not present

## 2016-07-20 DIAGNOSIS — E1151 Type 2 diabetes mellitus with diabetic peripheral angiopathy without gangrene: Secondary | ICD-10-CM | POA: Diagnosis not present

## 2016-07-20 DIAGNOSIS — Z4781 Encounter for orthopedic aftercare following surgical amputation: Secondary | ICD-10-CM | POA: Diagnosis not present

## 2016-07-20 DIAGNOSIS — E11319 Type 2 diabetes mellitus with unspecified diabetic retinopathy without macular edema: Secondary | ICD-10-CM | POA: Diagnosis not present

## 2016-07-20 DIAGNOSIS — I120 Hypertensive chronic kidney disease with stage 5 chronic kidney disease or end stage renal disease: Secondary | ICD-10-CM | POA: Diagnosis not present

## 2016-07-20 DIAGNOSIS — E1122 Type 2 diabetes mellitus with diabetic chronic kidney disease: Secondary | ICD-10-CM | POA: Diagnosis not present

## 2016-07-21 DIAGNOSIS — E1129 Type 2 diabetes mellitus with other diabetic kidney complication: Secondary | ICD-10-CM | POA: Diagnosis not present

## 2016-07-21 DIAGNOSIS — N186 End stage renal disease: Secondary | ICD-10-CM | POA: Diagnosis not present

## 2016-07-21 DIAGNOSIS — D631 Anemia in chronic kidney disease: Secondary | ICD-10-CM | POA: Diagnosis not present

## 2016-07-21 DIAGNOSIS — N2581 Secondary hyperparathyroidism of renal origin: Secondary | ICD-10-CM | POA: Diagnosis not present

## 2016-07-22 ENCOUNTER — Encounter: Payer: Self-pay | Admitting: Vascular Surgery

## 2016-07-22 ENCOUNTER — Ambulatory Visit (INDEPENDENT_AMBULATORY_CARE_PROVIDER_SITE_OTHER): Payer: Medicare Other | Admitting: Vascular Surgery

## 2016-07-22 VITALS — BP 127/84 | HR 98 | Temp 97.1°F | Resp 20 | Ht 75.5 in | Wt 219.6 lb

## 2016-07-22 DIAGNOSIS — I120 Hypertensive chronic kidney disease with stage 5 chronic kidney disease or end stage renal disease: Secondary | ICD-10-CM | POA: Diagnosis not present

## 2016-07-22 DIAGNOSIS — Z992 Dependence on renal dialysis: Secondary | ICD-10-CM

## 2016-07-22 DIAGNOSIS — E1151 Type 2 diabetes mellitus with diabetic peripheral angiopathy without gangrene: Secondary | ICD-10-CM | POA: Diagnosis not present

## 2016-07-22 DIAGNOSIS — E11319 Type 2 diabetes mellitus with unspecified diabetic retinopathy without macular edema: Secondary | ICD-10-CM | POA: Diagnosis not present

## 2016-07-22 DIAGNOSIS — N186 End stage renal disease: Secondary | ICD-10-CM | POA: Diagnosis not present

## 2016-07-22 DIAGNOSIS — E1122 Type 2 diabetes mellitus with diabetic chronic kidney disease: Secondary | ICD-10-CM | POA: Diagnosis not present

## 2016-07-22 DIAGNOSIS — I70235 Atherosclerosis of native arteries of right leg with ulceration of other part of foot: Secondary | ICD-10-CM

## 2016-07-22 DIAGNOSIS — Z4781 Encounter for orthopedic aftercare following surgical amputation: Secondary | ICD-10-CM | POA: Diagnosis not present

## 2016-07-22 NOTE — Progress Notes (Signed)
Patient name: [name] MRN: [id number] DOB: Jun 16, 1950 Sex: male  REASON FOR VISIT: Pain in old access site left arm. Consult is from Dr. Erling Cruz  HPI: [name] is a 66 y.o. male who has a functioning right basilic vein transposition which he uses for dialysis. 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 Over the last 2 weeks been having some pain in his fistula adjacent to the aneurysms. He denies fever or chills. The symptoms have only been going on for about 2 weeks.  Current Outpatient Prescriptions  Medication Sig Dispense Refill  . acetaminophen (TYLENOL) 325 MG tablet Take 650 mg by mouth every 6 (six) hours as needed (pain).    Marland Kitchen amlodipine-benazepril (LOTREL) 2.5-10 MG capsule Take 1 capsule by mouth daily.    . diclofenac sodium (VOLTAREN) 1 % GEL Apply topically 4 (four) times daily.    Marland Kitchen doxycycline (VIBRA-TABS) 100 MG tablet Take 1 tablet (100 mg total) by mouth every 12 (twelve) hours. 10 tablet 0  . ergocalciferol (VITAMIN D2) 50000 units capsule Take 50,000 Units by mouth once a week.    Marland Kitchen HYDROcodone-acetaminophen (NORCO/VICODIN) 5-325 MG tablet Take 1 tablet by mouth every 6 (six) hours as needed for moderate pain. 20 tablet 0  . insulin glargine (LANTUS) 100 UNIT/ML injection Inject 0.15 mLs (15 Units total) into the skin at bedtime. 10 mL 11  . lidocaine-prilocaine (EMLA) cream Apply 1 application topically as needed (Apply small amount to access site 1-2 hours before dialysis. Cover with occlusive dressing (saran wrap)).     . multivitamin (RENA-VIT) TABS tablet Take 1 tablet by mouth at bedtime. 30 tablet 0  . oxyCODONE (OXY IR/ROXICODONE) 5 MG immediate release tablet Take 1 tablet (5 mg total) by mouth every 6 (six) hours as needed for moderate pain. 30 tablet 0  . polysaccharide iron (NIFEREX) 150 MG CAPS capsule Take 1 capsule (150 mg total) by mouth daily. 30 each 2  . sevelamer carbonate (RENVELA) 800 MG tablet Take 1,600 mg by  mouth 3 (three) times daily with meals.    Marland Kitchen sulfamethoxazole-trimethoprim (BACTRIM DS,SEPTRA DS) 800-160 MG tablet Take 1 tablet by mouth 2 (two) times daily. 28 tablet 0  . warfarin (COUMADIN) 5 MG tablet     . oxyCODONE-acetaminophen (PERCOCET/ROXICET) 5-325 MG tablet      No current facility-administered medications for this visit.     REVIEW OF SYSTEMS:  [X]  denotes positive finding, [ ]  denotes negative finding Cardiac  Comments:  Chest pain or chest pressure:    Shortness of breath upon exertion:    Short of breath when lying flat:    Irregular heart rhythm:    Constitutional    Fever or chills:      PHYSICAL EXAM: Vitals:   07/22/16 1246  BP: 127/84  Pulse: 98  Resp: 20  Temp: 97.1 F (36.2 C)  TempSrc: Oral  SpO2: 100%  Weight: 219 lb 9.6 oz (99.6 kg)  Height: 6' 3.5" (1.918 m)    GENERAL: The patient is a well-nourished male, in no acute distress. The vital signs are documented above. CARDIOVASCULAR: There is a regular rate and rhythm. PULMONARY: There is good air exchange bilaterally without wheezing or rales. He has an excellent thrill in his right basilic vein transposition. 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 I would be reluctant to excise the fistula or the aneurysms given that this may create more pain than he currently has. There is nothing on exam to suggest that there is phlebitis or infection. I've encouraged him to try not to continue to rub the fistula like he has been doing and to perhaps elevate his arm some. If his symptoms do not improve this certainly we can remove the aneurysms and the majority of the fistula, however this may result in significant pain itself given the scar tissue from reoperative surgery. He will call if his symptoms do not  improve.  Deitra Mayo [facility] [phone]

## 2016-07-23 DIAGNOSIS — N2581 Secondary hyperparathyroidism of renal origin: Secondary | ICD-10-CM | POA: Diagnosis not present

## 2016-07-23 DIAGNOSIS — D631 Anemia in chronic kidney disease: Secondary | ICD-10-CM | POA: Diagnosis not present

## 2016-07-23 DIAGNOSIS — E1129 Type 2 diabetes mellitus with other diabetic kidney complication: Secondary | ICD-10-CM | POA: Diagnosis not present

## 2016-07-23 DIAGNOSIS — N186 End stage renal disease: Secondary | ICD-10-CM | POA: Diagnosis not present

## 2016-07-24 DIAGNOSIS — E11319 Type 2 diabetes mellitus with unspecified diabetic retinopathy without macular edema: Secondary | ICD-10-CM | POA: Diagnosis not present

## 2016-07-24 DIAGNOSIS — I120 Hypertensive chronic kidney disease with stage 5 chronic kidney disease or end stage renal disease: Secondary | ICD-10-CM | POA: Diagnosis not present

## 2016-07-24 DIAGNOSIS — N186 End stage renal disease: Secondary | ICD-10-CM | POA: Diagnosis not present

## 2016-07-24 DIAGNOSIS — Z4781 Encounter for orthopedic aftercare following surgical amputation: Secondary | ICD-10-CM | POA: Diagnosis not present

## 2016-07-24 DIAGNOSIS — E1122 Type 2 diabetes mellitus with diabetic chronic kidney disease: Secondary | ICD-10-CM | POA: Diagnosis not present

## 2016-07-24 DIAGNOSIS — E1151 Type 2 diabetes mellitus with diabetic peripheral angiopathy without gangrene: Secondary | ICD-10-CM | POA: Diagnosis not present

## 2016-07-25 DIAGNOSIS — N186 End stage renal disease: Secondary | ICD-10-CM | POA: Diagnosis not present

## 2016-07-25 DIAGNOSIS — E1129 Type 2 diabetes mellitus with other diabetic kidney complication: Secondary | ICD-10-CM | POA: Diagnosis not present

## 2016-07-25 DIAGNOSIS — D631 Anemia in chronic kidney disease: Secondary | ICD-10-CM | POA: Diagnosis not present

## 2016-07-25 DIAGNOSIS — N2581 Secondary hyperparathyroidism of renal origin: Secondary | ICD-10-CM | POA: Diagnosis not present

## 2016-07-27 ENCOUNTER — Ambulatory Visit (INDEPENDENT_AMBULATORY_CARE_PROVIDER_SITE_OTHER): Payer: Medicare Other | Admitting: Podiatry

## 2016-07-27 ENCOUNTER — Encounter: Payer: Self-pay | Admitting: Podiatry

## 2016-07-27 DIAGNOSIS — L97509 Non-pressure chronic ulcer of other part of unspecified foot with unspecified severity: Secondary | ICD-10-CM | POA: Diagnosis not present

## 2016-07-27 DIAGNOSIS — Z89431 Acquired absence of right foot: Secondary | ICD-10-CM

## 2016-07-27 DIAGNOSIS — I70235 Atherosclerosis of native arteries of right leg with ulceration of other part of foot: Secondary | ICD-10-CM | POA: Diagnosis not present

## 2016-07-27 DIAGNOSIS — T8789 Other complications of amputation stump: Secondary | ICD-10-CM

## 2016-07-27 DIAGNOSIS — I739 Peripheral vascular disease, unspecified: Secondary | ICD-10-CM

## 2016-07-28 ENCOUNTER — Telehealth: Payer: Self-pay | Admitting: *Deleted

## 2016-07-28 DIAGNOSIS — N2581 Secondary hyperparathyroidism of renal origin: Secondary | ICD-10-CM | POA: Diagnosis not present

## 2016-07-28 DIAGNOSIS — D631 Anemia in chronic kidney disease: Secondary | ICD-10-CM | POA: Diagnosis not present

## 2016-07-28 DIAGNOSIS — N186 End stage renal disease: Secondary | ICD-10-CM | POA: Diagnosis not present

## 2016-07-28 DIAGNOSIS — E1129 Type 2 diabetes mellitus with other diabetic kidney complication: Secondary | ICD-10-CM | POA: Diagnosis not present

## 2016-07-28 NOTE — Telephone Encounter (Addendum)
-----   Message from Edrick Kins, DPM sent at 07/27/2016  2:29 PM EST ----- Regarding: modify home health dressing change orders Please modify home health dressing change orders. We're going to hold the wound VAC for 2 weeks. 3x/week x 6 weeks.   - Cleanse with normal saline. Dry. - Pain periwound with Betadine. - Applied Prisma to ulceration site with overlying Aquacel AG - Dressed with 4x4 gauze, kerlex, ace wrap.   Thanks, Dr. Amalia Hailey. 07/28/2016-Copy of 07/27/2016 2:29pm Orders faxed to [facility].

## 2016-07-29 DIAGNOSIS — E1151 Type 2 diabetes mellitus with diabetic peripheral angiopathy without gangrene: Secondary | ICD-10-CM | POA: Diagnosis not present

## 2016-07-29 DIAGNOSIS — E1122 Type 2 diabetes mellitus with diabetic chronic kidney disease: Secondary | ICD-10-CM | POA: Diagnosis not present

## 2016-07-29 DIAGNOSIS — E1129 Type 2 diabetes mellitus with other diabetic kidney complication: Secondary | ICD-10-CM | POA: Diagnosis not present

## 2016-07-29 DIAGNOSIS — E11319 Type 2 diabetes mellitus with unspecified diabetic retinopathy without macular edema: Secondary | ICD-10-CM | POA: Diagnosis not present

## 2016-07-29 DIAGNOSIS — Z992 Dependence on renal dialysis: Secondary | ICD-10-CM | POA: Diagnosis not present

## 2016-07-29 DIAGNOSIS — Z4781 Encounter for orthopedic aftercare following surgical amputation: Secondary | ICD-10-CM | POA: Diagnosis not present

## 2016-07-29 DIAGNOSIS — I120 Hypertensive chronic kidney disease with stage 5 chronic kidney disease or end stage renal disease: Secondary | ICD-10-CM | POA: Diagnosis not present

## 2016-07-29 DIAGNOSIS — N186 End stage renal disease: Secondary | ICD-10-CM | POA: Diagnosis not present

## 2016-07-30 DIAGNOSIS — E11319 Type 2 diabetes mellitus with unspecified diabetic retinopathy without macular edema: Secondary | ICD-10-CM | POA: Diagnosis not present

## 2016-07-30 DIAGNOSIS — Z4781 Encounter for orthopedic aftercare following surgical amputation: Secondary | ICD-10-CM | POA: Diagnosis not present

## 2016-07-30 DIAGNOSIS — N2581 Secondary hyperparathyroidism of renal origin: Secondary | ICD-10-CM | POA: Diagnosis not present

## 2016-07-30 DIAGNOSIS — E1151 Type 2 diabetes mellitus with diabetic peripheral angiopathy without gangrene: Secondary | ICD-10-CM | POA: Diagnosis not present

## 2016-07-30 DIAGNOSIS — E1122 Type 2 diabetes mellitus with diabetic chronic kidney disease: Secondary | ICD-10-CM | POA: Diagnosis not present

## 2016-07-30 DIAGNOSIS — D631 Anemia in chronic kidney disease: Secondary | ICD-10-CM | POA: Diagnosis not present

## 2016-07-30 DIAGNOSIS — E1129 Type 2 diabetes mellitus with other diabetic kidney complication: Secondary | ICD-10-CM | POA: Diagnosis not present

## 2016-07-30 DIAGNOSIS — I120 Hypertensive chronic kidney disease with stage 5 chronic kidney disease or end stage renal disease: Secondary | ICD-10-CM | POA: Diagnosis not present

## 2016-07-30 DIAGNOSIS — N186 End stage renal disease: Secondary | ICD-10-CM | POA: Diagnosis not present

## 2016-07-31 DIAGNOSIS — E1151 Type 2 diabetes mellitus with diabetic peripheral angiopathy without gangrene: Secondary | ICD-10-CM | POA: Diagnosis not present

## 2016-07-31 DIAGNOSIS — I120 Hypertensive chronic kidney disease with stage 5 chronic kidney disease or end stage renal disease: Secondary | ICD-10-CM | POA: Diagnosis not present

## 2016-07-31 DIAGNOSIS — Z4781 Encounter for orthopedic aftercare following surgical amputation: Secondary | ICD-10-CM | POA: Diagnosis not present

## 2016-07-31 DIAGNOSIS — N186 End stage renal disease: Secondary | ICD-10-CM | POA: Diagnosis not present

## 2016-07-31 DIAGNOSIS — E1122 Type 2 diabetes mellitus with diabetic chronic kidney disease: Secondary | ICD-10-CM | POA: Diagnosis not present

## 2016-07-31 DIAGNOSIS — E11319 Type 2 diabetes mellitus with unspecified diabetic retinopathy without macular edema: Secondary | ICD-10-CM | POA: Diagnosis not present

## 2016-08-01 DIAGNOSIS — E1129 Type 2 diabetes mellitus with other diabetic kidney complication: Secondary | ICD-10-CM | POA: Diagnosis not present

## 2016-08-01 DIAGNOSIS — N2581 Secondary hyperparathyroidism of renal origin: Secondary | ICD-10-CM | POA: Diagnosis not present

## 2016-08-01 DIAGNOSIS — D631 Anemia in chronic kidney disease: Secondary | ICD-10-CM | POA: Diagnosis not present

## 2016-08-01 DIAGNOSIS — N186 End stage renal disease: Secondary | ICD-10-CM | POA: Diagnosis not present

## 2016-08-02 NOTE — Progress Notes (Signed)
Subjective:  Patient presents today for follow-up evaluation of dehiscence to the right transmetatarsal amputation stump right foot. Date of surgery 03/13/2016. Patient states that he is doing well. Patient presents today with wound VAC application.  Objective/Physical Exam General: The patient is alert and oriented x3 in no acute distress.  Dermatology:  Wound #1 noted to the transmetatarsal amputation stump lateral measuring [phone] cm (LxWxD).   To the noted ulceration(s), there is no eschar. There is a moderate amount of slough, fibrin, and necrotic tissue noted. Granulation tissue and wound base is red. There is a moderate amount of serosanguineous drainage noted. There is no exposed bone. There is no exposed muscle and tendon. There is malodor likely due to maceration secondary to a wound that. Periwound integrity is intact. Skin is warm, dry and supple bilateral lower extremities.  Vascular: Severe peripheral vascular disease being managed by vascular.  Neurological: Epicritic and protective threshold absent bilaterally.   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 #2 medically necessary excisional debridement including muscle and deep fascial tissue was performed using a tissue nipper and a chisel blade. Excisional debridement of all the necrotic nonviable tissue down to healthy bleeding viable tissue was performed with post-debridement measurements same as pre-. #3 the wound was cleansed and dry sterile dressing applied. #4 today were going to discontinue or hold the wound VAC therapy for 2 weeks. The ulceration appears to be very healthy and viable. #5 changed home health dressing change orders to Prisma collagen and aqua cell every  other day #Return to clinic in 2 weeks  Edrick Kins, DPM [facility]  Dr. Edrick Kins, [address]                                        [place], [place] [address]                Office [phone]  Fax [phone]

## 2016-08-03 DIAGNOSIS — E11319 Type 2 diabetes mellitus with unspecified diabetic retinopathy without macular edema: Secondary | ICD-10-CM | POA: Diagnosis not present

## 2016-08-03 DIAGNOSIS — I120 Hypertensive chronic kidney disease with stage 5 chronic kidney disease or end stage renal disease: Secondary | ICD-10-CM | POA: Diagnosis not present

## 2016-08-03 DIAGNOSIS — N186 End stage renal disease: Secondary | ICD-10-CM | POA: Diagnosis not present

## 2016-08-03 DIAGNOSIS — Z4781 Encounter for orthopedic aftercare following surgical amputation: Secondary | ICD-10-CM | POA: Diagnosis not present

## 2016-08-03 DIAGNOSIS — E1122 Type 2 diabetes mellitus with diabetic chronic kidney disease: Secondary | ICD-10-CM | POA: Diagnosis not present

## 2016-08-03 DIAGNOSIS — E1151 Type 2 diabetes mellitus with diabetic peripheral angiopathy without gangrene: Secondary | ICD-10-CM | POA: Diagnosis not present

## 2016-08-03 NOTE — Telephone Encounter (Addendum)
-----   Message from Edrick Kins, DPM sent at 08/02/2016  5:46 PM EST ----- Regarding: Home health dressing change order modification Please modify home health dressing change orders. Dressing changes 3x/week 6 weeks  Dx: RT TMA stump ulcer secondary to PVD and DM  - Cleanse wound with normal saline. Dry. - Applied collagen dressing (prisma or puricol)  - Apply Aquacel Ag - Dress with dry sterile dressing  Thanks, Dr. Amalia Hailey. 08/03/2016-Faxed copy of 08/02/2016 5:46pm orders to [facility]. Lorriane Shire - [facility] states needs clarification of wound care orders for pt, his wound is less than 1/2cm long with little to no drainage, and Aquacel AG is not indicated for wounds with little or no drainage, would Dr. Amalia Hailey prefer regular gauze to the area.

## 2016-08-04 DIAGNOSIS — D631 Anemia in chronic kidney disease: Secondary | ICD-10-CM | POA: Diagnosis not present

## 2016-08-04 DIAGNOSIS — N2581 Secondary hyperparathyroidism of renal origin: Secondary | ICD-10-CM | POA: Diagnosis not present

## 2016-08-04 DIAGNOSIS — N186 End stage renal disease: Secondary | ICD-10-CM | POA: Diagnosis not present

## 2016-08-04 DIAGNOSIS — E1129 Type 2 diabetes mellitus with other diabetic kidney complication: Secondary | ICD-10-CM | POA: Diagnosis not present

## 2016-08-05 ENCOUNTER — Other Ambulatory Visit: Payer: Self-pay

## 2016-08-05 ENCOUNTER — Telehealth: Payer: Self-pay | Admitting: Cardiology

## 2016-08-05 ENCOUNTER — Encounter: Payer: Self-pay | Admitting: Cardiology

## 2016-08-05 ENCOUNTER — Ambulatory Visit (HOSPITAL_COMMUNITY): Payer: Medicare Other | Attending: Cardiology

## 2016-08-05 ENCOUNTER — Ambulatory Visit (INDEPENDENT_AMBULATORY_CARE_PROVIDER_SITE_OTHER): Payer: Medicare Other | Admitting: Cardiology

## 2016-08-05 VITALS — BP 134/66 | HR 109 | Ht 75.0 in | Wt 223.0 lb

## 2016-08-05 DIAGNOSIS — C8590 Non-Hodgkin lymphoma, unspecified, unspecified site: Secondary | ICD-10-CM | POA: Diagnosis not present

## 2016-08-05 DIAGNOSIS — R079 Chest pain, unspecified: Secondary | ICD-10-CM | POA: Diagnosis not present

## 2016-08-05 DIAGNOSIS — I480 Paroxysmal atrial fibrillation: Secondary | ICD-10-CM | POA: Insufficient documentation

## 2016-08-05 DIAGNOSIS — E11319 Type 2 diabetes mellitus with unspecified diabetic retinopathy without macular edema: Secondary | ICD-10-CM | POA: Diagnosis not present

## 2016-08-05 DIAGNOSIS — E1122 Type 2 diabetes mellitus with diabetic chronic kidney disease: Secondary | ICD-10-CM | POA: Insufficient documentation

## 2016-08-05 DIAGNOSIS — N186 End stage renal disease: Secondary | ICD-10-CM | POA: Diagnosis not present

## 2016-08-05 DIAGNOSIS — E785 Hyperlipidemia, unspecified: Secondary | ICD-10-CM | POA: Insufficient documentation

## 2016-08-05 DIAGNOSIS — I081 Rheumatic disorders of both mitral and tricuspid valves: Secondary | ICD-10-CM | POA: Diagnosis not present

## 2016-08-05 DIAGNOSIS — I120 Hypertensive chronic kidney disease with stage 5 chronic kidney disease or end stage renal disease: Secondary | ICD-10-CM | POA: Insufficient documentation

## 2016-08-05 DIAGNOSIS — Z4781 Encounter for orthopedic aftercare following surgical amputation: Secondary | ICD-10-CM | POA: Diagnosis not present

## 2016-08-05 DIAGNOSIS — E1151 Type 2 diabetes mellitus with diabetic peripheral angiopathy without gangrene: Secondary | ICD-10-CM | POA: Insufficient documentation

## 2016-08-05 DIAGNOSIS — E1140 Type 2 diabetes mellitus with diabetic neuropathy, unspecified: Secondary | ICD-10-CM | POA: Diagnosis not present

## 2016-08-05 DIAGNOSIS — I70235 Atherosclerosis of native arteries of right leg with ulceration of other part of foot: Secondary | ICD-10-CM

## 2016-08-05 MED ORDER — METOPROLOL SUCCINATE ER 25 MG PO TB24: 25.0000 mg | ORAL_TABLET | Freq: Every day | ORAL | 3 refills | Status: DC

## 2016-08-05 NOTE — Telephone Encounter (Signed)
Pt gave verbal ok to speak w/ Hessie (mother-in-law).  Clarified patient cannot eat/drink 2 hr prior to stress testing.  She thanks me for calling because she thought he couldn't eat/drink for 12 hr prior. They understand stress test instructions now and thank me for clarifying confusion.

## 2016-08-05 NOTE — Patient Instructions (Signed)
Medication Instructions:    Your physician has recommended you make the following change in your medication:  1) START Toprol 25 mg once daily  --- If you need a refill on your cardiac medications before your next appointment, please call your pharmacy. ---  Labwork:  None ordered  Testing/Procedures: .Your physician has requested that you have a lexiscan myoview. For further information please visit [email or web address]. Please follow instruction sheet, as given.  Follow-Up:  You have been referred to Coumadin clinic to start Coumadin   Your physician recommends that you schedule a follow-up appointment in: 2 weeks for nurse visit EKG   Your physician recommends that you schedule a follow-up appointment in: 3 months with Dr. Curt Bears.  Thank you for choosing [facility]!!   John Curet, RN [phone]   Any Other Special Instructions Will Be Listed Below (If Applicable).  Pharmacologic Stress Electrocardiogram Introduction A pharmacologic stress electrocardiogram is a heart (cardiac) test that uses nuclear imaging to evaluate the blood supply to your heart. This test may also be called a pharmacologic stress electrocardiography. Pharmacologic means that a medicine is used to increase your heart rate and blood pressure. This stress test is done to find areas of poor blood flow to the heart by determining the extent of coronary artery disease (CAD). Some people exercise on a treadmill, which naturally increases the blood flow to the heart. For those people unable to exercise on a treadmill, a medicine is used. This medicine stimulates your heart and will cause your heart to beat harder and more quickly, as if you were exercising. Pharmacologic stress tests can help determine:  The adequacy of blood flow to your heart during increased levels of activity in order to clear you for discharge home.  The extent of coronary artery blockage caused by CAD.  Your prognosis if you  have suffered a heart attack.  The effectiveness of cardiac procedures done, such as an angioplasty, which can increase the circulation in your coronary arteries.  Causes of chest pain or pressure. LET [facility] CARE PROVIDER KNOW ABOUT:  Any allergies you have.  All medicines you are taking, including vitamins, herbs, eye drops, creams, and over-the-counter medicines.  Previous problems you or members of your family have had with the use of anesthetics.  Any blood disorders you have.  Previous surgeries you have had.  Medical conditions you have.  Possibility of pregnancy, if this applies.  If you are currently breastfeeding. RISKS AND COMPLICATIONS Generally, this is a safe procedure. However, as with any procedure, complications can occur. Possible complications include:  You develop pain or pressure in the following areas:  Chest.  Jaw or neck.  Between your shoulder blades.  Radiating down your left arm.  Headache.  Dizziness or light-headedness.  Shortness of breath.  Increased or irregular heartbeat.  Low blood pressure.  Nausea or vomiting.  Flushing.  Redness going up the arm and slight pain during injection of medicine.  Heart attack (rare). BEFORE THE PROCEDURE  Avoid all forms of caffeine for 24 hours before your test or as directed by your health care provider. This includes coffee, tea (even decaffeinated tea), caffeinated sodas, chocolate, cocoa, and certain pain medicines.  Follow your health care provider's instructions regarding eating and drinking before the test.  Take your medicines as directed at regular times with water unless instructed otherwise. Exceptions may include:  If you have diabetes, ask how you are to take your insulin or pills. It is common to adjust insulin  dosing the morning of the test.  If you are taking beta-blocker medicines, it is important to talk to your health care provider about these medicines well before  the date of your test. Taking beta-blocker medicines may interfere with the test. In some cases, these medicines need to be changed or stopped 24 hours or more before the test.  If you wear a nitroglycerin patch, it may need to be removed prior to the test. Ask your health care provider if the patch should be removed before the test.  If you use an inhaler for any breathing condition, bring it with you to the test.  If you are an outpatient, bring a snack so you can eat right after the stress phase of the test.  Do not smoke for 4 hours prior to the test or as directed by your health care provider.  Do not apply lotions, powders, creams, or oils on your chest prior to the test.  Wear comfortable shoes and clothing. Let your health care provider know if you were unable to complete or follow the preparations for your test. PROCEDURE  Multiple patches (electrodes) will be put on your chest. If needed, small areas of your chest may be shaved to get better contact with the electrodes. Once the electrodes are attached to your body, multiple wires will be attached to the electrodes, and your heart rate will be monitored.  An IV access will be started. A nuclear trace (isotope) is given. The isotope may be given intravenously, or it may be swallowed. Nuclear refers to several types of radioactive isotopes, and the nuclear isotope lights up the arteries so that the nuclear images are clear. The isotope is absorbed by your body. This results in low radiation exposure.  A resting nuclear image is taken to show how your heart functions at rest.  A medicine is given through the IV access.  A second scan is done about 1 hour after the medicine injection and determines how your heart functions under stress.  During this stress phase, you will be connected to an electrocardiogram machine. Your blood pressure and oxygen levels will be monitored. What to expect after the procedure  Your heart rate and  blood pressure will be monitored after the test.  You may return to your normal schedule, including diet,activities, and medicines, unless your health care provider tells you otherwise. This information is not intended to replace advice given to you by your health care provider. Make sure you discuss any questions you have with your health care provider. Document Released: 10/04/2008 Document Revised: 10/24/2015 Document Reviewed: 11/25/2015 Elsevier Interactive Patient Education  2017 [employer].

## 2016-08-05 NOTE — Telephone Encounter (Signed)
New Message    Pt has question on stress test, pt has diabetes and he can not go that long without food

## 2016-08-05 NOTE — Progress Notes (Signed)
Electrophysiology Office Note   Date:  08/05/2016   ID:  Alexandra, Posadas 05-Apr-1951, MRN [id number]  PCP:  Geoffery Lyons, MD  Primary Electrophysiologist:  Constance Haw, MD    Chief Complaint  Patient presents with  . Follow-up    PAF     History of Present Illness: CLINTEN HOWK is a 66 y.o. male who presents today for electrophysiology evaluation.   He has a past history of CK D on dialysis, diabetes, DVT, hyperlipidemia, hypertension, and SVT. He was put on Coumadin for a DVT with filter placed in 2014. It appears that he potentially had a diagnosis of atrial fibrillation in 2016 while on his Coumadin. He is being referred for evaluation of atrial fibrillation.    Today, he denies symptoms of palpitations, shortness of breath, orthopnea, PND, lower extremity edema, claudication, dizziness, presyncope, syncope, bleeding, or neurologic sequela. The patient is tolerating medications without difficulties and is otherwise without complaint today. He has been having some chest pain with dialysis. He feels that sometimes they are pulling fluid too quickly. Once they backed off on how much fluid or pulling his chest pain went away. He also has chest pain at times when he exerts himself. The pain as a burning sensation that lasts up to a minute in the center of his chest without radiation. He has no shortness of breath. The pain is improved with rest.   Past Medical History:  Diagnosis Date  . Anemia   . Arthritis    HNP- lumbar, "all over my body"  . Blood transfusion    "years ago; blood was low" (08/05/2013)  . CKD (chronic kidney disease) stage 4, GFR 15-29 ml/min (HCC) 03/18/2012   [place]- T,TH,Sat.  . Diabetic nephropathy ([place])   . Diabetic retinopathy   . DVT (deep venous thrombosis) ([place])    "got one in my right leg now; I've had one before too, not sure which leg" (08/05/2013)  . ESRD (end stage renal disease) on dialysis [facility])    "just started  today, (08/04/2013)"  . Family history of anesthesia complication    " my son wakes up slowly"  . GERD (gastroesophageal reflux disease)    uses alka seltzere on occas.   [name])    "one q now and then" (08/05/2013)  . Hyperlipidemia   . Hypertension   . 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 Non Hodgkin's lymphoma ([place])    Tx 2009; "had chemo; it went away" (08/05/2013)  . Noncompliance 03/16/2012  . NSVT (nonsustained ventricular tachycardia) ([place]) 03/18/2012  . Peripheral vascular disease ([place])   . Pneumonia 2013   hosp.-   . Poor historian    pt. unsure of several answers to health history questions   . Skin cancer    melanoma - head  . Sleep apnea    "suppose to have a sleep study, but they never told me when. (08/05/2013)   Past Surgical History:  Procedure Laterality Date  . ACHILLES TENDON SURGERY Right 03/13/2016   Procedure: ACHILLES LENGTHENING/KIDNER;  Surgeon: Edrick Kins, DPM;  Location: [place];  Service: Podiatry;  Laterality: Right;  . AV FISTULA PLACEMENT Left 02/03/2013   Procedure: ARTERIOVENOUS (AV) FISTULA CREATION- LEFT RADIAL CEPHALIC; ULTRASOUND GUIDED;  Surgeon: Mal Misty, MD;  Location: [place];  Service: Vascular;  Laterality: Left;  . AV FISTULA PLACEMENT Right 11/08/2015  Procedure: RIGHT BRACHIOCEPHALIC ARTERIOVENOUS (AV) FISTULA CREATION;  Surgeon: Serafina Mitchell, MD;  Location: [place];  Service: Vascular;  Laterality: Right;  . BASCILIC VEIN TRANSPOSITION Right 01/24/2016   Procedure: RIGHT SECOND STAGE BASILIC VEIN TRANSPOSITION;  Surgeon: Angelia Mould, MD;  Location: [place];  Service: Vascular;  Laterality: Right;  . CARDIAC CATHETERIZATION    . COLONOSCOPY N/A 09/23/2015   Procedure: COLONOSCOPY;  Surgeon: Irene Shipper, MD;  Location: [facility];  Service: Endoscopy;  Laterality: N/A;  . Coloscopy    . EYE SURGERY Bilateral   . GAS INSERTION   05/10/2012   Procedure: INSERTION OF GAS;  Surgeon: Hayden Pedro, MD;  Location: [place];  Service: Ophthalmology;  Laterality: Right;  . LESION EXCISION Right 05/08/2015   Procedure: EXCISION SCALP LESION;  Surgeon: Erroll Luna, MD;  Location: [place];  Service: General;  Laterality: Right;  . MEMBRANE PEEL  05/10/2012   Procedure: MEMBRANE PEEL;  Surgeon: Hayden Pedro, MD;  Location: [place];  Service: Ophthalmology;  Laterality: Right;  . PARS PLANA VITRECTOMY  08/27/2011   Procedure: PARS PLANA VITRECTOMY WITH 25 GAUGE;  Surgeon: Hayden Pedro, MD;  Location: [place];  Service: Ophthalmology;  Laterality: Left;  Repair of complex traction retinal detachment left eye  . PARS PLANA VITRECTOMY  05/10/2012   Procedure: PARS PLANA VITRECTOMY WITH 25 GAUGE;  Surgeon: Hayden Pedro, MD;  Location: [place];  Service: Ophthalmology;  Laterality: Right;  Repair Complex Traction Retinal Detachment  . PHOTOCOAGULATION WITH LASER  05/10/2012   Procedure: PHOTOCOAGULATION WITH LASER;  Surgeon: Hayden Pedro, MD;  Location: [place];  Service: Ophthalmology;  Laterality: Right;  . PORT-A-CATH REMOVAL    . PORTACATH PLACEMENT    . TRANSMETATARSAL AMPUTATION Right 03/13/2016   Procedure: TRANSMETATARSAL AMPUTATION;  Surgeon: Edrick Kins, DPM;  Location: [place];  Service: Podiatry;  Laterality: Right;  . VENA CAVA FILTER PLACEMENT  09/2012   due to preparation for surgery     Current Outpatient Prescriptions  Medication Sig Dispense Refill  . acetaminophen (TYLENOL) 325 MG tablet Take 650 mg by mouth every 6 (six) hours as needed (pain).    Marland Kitchen diclofenac sodium (VOLTAREN) 1 % GEL Apply topically 4 (four) times daily.    Marland Kitchen doxycycline (VIBRA-TABS) 100 MG tablet Take 1 tablet (100 mg total) by mouth every 12 (twelve) hours. 10 tablet 0  . ergocalciferol (VITAMIN D2) 50000 units capsule Take 50,000 Units by mouth once a week.    Marland Kitchen HYDROcodone-acetaminophen (NORCO/VICODIN) 5-325 MG tablet Take 1 tablet by  mouth every 6 (six) hours as needed for moderate pain. 20 tablet 0  . insulin glargine (LANTUS) 100 UNIT/ML injection Inject 0.15 mLs (15 Units total) into the skin at bedtime. 10 mL 11  . lidocaine-prilocaine (EMLA) cream Apply 1 application topically as needed (Apply small amount to access site 1-2 hours before dialysis. Cover with occlusive dressing (saran wrap)).     . multivitamin (RENA-VIT) TABS tablet Take 1 tablet by mouth at bedtime. 30 tablet 0  . oxyCODONE (OXY IR/ROXICODONE) 5 MG immediate release tablet Take 1 tablet (5 mg total) by mouth every 6 (six) hours as needed for moderate pain. 30 tablet 0  . oxyCODONE-acetaminophen (PERCOCET/ROXICET) 5-325 MG tablet     . polysaccharide iron (NIFEREX) 150 MG CAPS capsule Take 1 capsule (150 mg total) by mouth daily. 30 each 2  . sevelamer carbonate (RENVELA) 800 MG tablet Take 1,600 mg by mouth 3 (three)  times daily with meals.    Marland Kitchen sulfamethoxazole-trimethoprim (BACTRIM DS,SEPTRA DS) 800-160 MG tablet Take 1 tablet by mouth 2 (two) times daily. 28 tablet 0   No current facility-administered medications for this visit.     Allergies:   No known allergies   Social History:  The patient  reports that he has never smoked. He has never used smokeless tobacco. He reports that he does not drink alcohol or use drugs.   Family History:  The patient's family history includes Anesthesia problems in his son; Diabetes in his father; Hypertension in his father and son; Other in his father.    ROS:  Please see the history of present illness.   Otherwise, review of systems is positive for cough.   All other systems are reviewed and negative.    PHYSICAL EXAM: VS:  BP 134/66   Pulse (!) 109   Ht 6\' 3"  (1.905 m)   Wt 223 lb (101.2 kg)   BMI 27.87 kg/m  , BMI Body mass index is 27.87 kg/m. GEN: Well nourished, well developed, in no acute distress  HEENT: normal  Neck: no JVD, carotid bruits, or masses Cardiac: tachycardic, regular; no murmurs,  rubs, or gallops,no edema  Respiratory:  clear to auscultation bilaterally, normal work of breathing GI: soft, nontender, nondistended, + BS MS: no deformity or atrophy  Skin: warm and dry,  Neuro:  Strength and sensation are intact Psych: euthymic mood, full affect  EKG:  EKG is ordered today. Personal review of the ekg ordered 03/13/16 shows wide complex tachycardia, possible atrial flutter vs atrial tachycardia, RBBB, LAFB  Recent Labs: 03/06/2016: ALT 14 03/16/2016: BUN 32; Creatinine, Ser 6.16; Potassium 4.3; Sodium 134 03/17/2016: Hemoglobin 9.2; Platelets 340    Lipid Panel     Component Value Date/Time   CHOL 129 01/14/2011 0437   TRIG 137 01/14/2011 0437   HDL 13 (L) 01/14/2011 0437   CHOLHDL 9.9 01/14/2011 0437   VLDL 27 01/14/2011 0437   LDLCALC 89 01/14/2011 0437     Wt Readings from Last 3 Encounters:  08/05/16 223 lb (101.2 kg)  07/22/16 219 lb 9.6 oz (99.6 kg)  05/20/16 228 lb (103.4 kg)      Other studies Reviewed: Additional studies/ records that were reviewed today include: TTE 2014  Review of the above records today demonstrates:   - Left ventricle: The cavity size was normal. Wall thickness was increased in a pattern of mild LVH. Systolic function was normal. The estimated ejection fraction was in the range of 55% to 60%. - Mitral valve: Mild regurgitation. - Left atrium: The atrium was mildly dilated. - Atrial septum: No defect or patent foramen ovale was identified. - Pulmonary arteries: PA peak pressure: 45mm Hg (S). - Pericardium, extracardiac: A trivial pericardial effusion was identified.   ASSESSMENT AND PLAN:  1.  Atrial fibrillation: Likely paroxysmal. Hayde Kilgour restart his Coumadin today. He is in either an atrial tachycardia versus atrial flutter as well. We'll start him on 25 mg of metoprolol for improved rate control. We'll bring him back for an EKG in 2 weeks. Pria Klosinski also start him back on coumadin for his history of atrial  fibrillation and elevated stroke risk.  This patients CHA2DS2-VASc Score and unadjusted Ischemic Stroke Rate (% per year) is equal to 3.2 % stroke rate/year from a score of 3  Above score calculated as 1 point each if present [CHF, HTN, DM, Vascular=MI/PAD/Aortic Plaque, Age if 65-74, or Male] Above score calculated as 2 points each  if present [Age > 75, or Stroke/TIA/TE]   2. Hypertension: Well-controlled today. Managed by primary care.  3. Rest pain: He does have chest pain with dialysis, but also has exertional chest pain. We'll order a Myoview.  Current medicines are reviewed at length with the patient today.   The patient does not have concerns regarding his medicines.  The following changes were made today:  none  Labs/ tests ordered today include:  Orders Placed This Encounter  Procedures  . EKG 12-Lead     Disposition:   FU with Mashonda Broski 3 months  Signed, Selby Slovacek [name], MD  08/05/2016 12:05 PM     [place] [address] [place] [address] [phone] (office) [phone] (fax)

## 2016-08-06 DIAGNOSIS — D631 Anemia in chronic kidney disease: Secondary | ICD-10-CM | POA: Diagnosis not present

## 2016-08-06 DIAGNOSIS — E1129 Type 2 diabetes mellitus with other diabetic kidney complication: Secondary | ICD-10-CM | POA: Diagnosis not present

## 2016-08-06 DIAGNOSIS — I80202 Phlebitis and thrombophlebitis of unspecified deep vessels of left lower extremity: Secondary | ICD-10-CM | POA: Diagnosis not present

## 2016-08-06 DIAGNOSIS — Z7901 Long term (current) use of anticoagulants: Secondary | ICD-10-CM | POA: Diagnosis not present

## 2016-08-06 DIAGNOSIS — N186 End stage renal disease: Secondary | ICD-10-CM | POA: Diagnosis not present

## 2016-08-06 DIAGNOSIS — N2581 Secondary hyperparathyroidism of renal origin: Secondary | ICD-10-CM | POA: Diagnosis not present

## 2016-08-06 DIAGNOSIS — I4891 Unspecified atrial fibrillation: Secondary | ICD-10-CM | POA: Diagnosis not present

## 2016-08-07 DIAGNOSIS — Z4781 Encounter for orthopedic aftercare following surgical amputation: Secondary | ICD-10-CM | POA: Diagnosis not present

## 2016-08-07 DIAGNOSIS — E1122 Type 2 diabetes mellitus with diabetic chronic kidney disease: Secondary | ICD-10-CM | POA: Diagnosis not present

## 2016-08-07 DIAGNOSIS — E11319 Type 2 diabetes mellitus with unspecified diabetic retinopathy without macular edema: Secondary | ICD-10-CM | POA: Diagnosis not present

## 2016-08-07 DIAGNOSIS — E1151 Type 2 diabetes mellitus with diabetic peripheral angiopathy without gangrene: Secondary | ICD-10-CM | POA: Diagnosis not present

## 2016-08-07 DIAGNOSIS — N186 End stage renal disease: Secondary | ICD-10-CM | POA: Diagnosis not present

## 2016-08-07 DIAGNOSIS — I120 Hypertensive chronic kidney disease with stage 5 chronic kidney disease or end stage renal disease: Secondary | ICD-10-CM | POA: Diagnosis not present

## 2016-08-08 DIAGNOSIS — N186 End stage renal disease: Secondary | ICD-10-CM | POA: Diagnosis not present

## 2016-08-08 DIAGNOSIS — N2581 Secondary hyperparathyroidism of renal origin: Secondary | ICD-10-CM | POA: Diagnosis not present

## 2016-08-08 DIAGNOSIS — D631 Anemia in chronic kidney disease: Secondary | ICD-10-CM | POA: Diagnosis not present

## 2016-08-08 DIAGNOSIS — E1129 Type 2 diabetes mellitus with other diabetic kidney complication: Secondary | ICD-10-CM | POA: Diagnosis not present

## 2016-08-10 ENCOUNTER — Ambulatory Visit: Payer: Medicare Other | Admitting: Podiatry

## 2016-08-10 ENCOUNTER — Telehealth: Payer: Self-pay | Admitting: *Deleted

## 2016-08-10 ENCOUNTER — Other Ambulatory Visit: Payer: Self-pay | Admitting: *Deleted

## 2016-08-10 DIAGNOSIS — E1151 Type 2 diabetes mellitus with diabetic peripheral angiopathy without gangrene: Secondary | ICD-10-CM | POA: Diagnosis not present

## 2016-08-10 DIAGNOSIS — E1122 Type 2 diabetes mellitus with diabetic chronic kidney disease: Secondary | ICD-10-CM | POA: Diagnosis not present

## 2016-08-10 DIAGNOSIS — I120 Hypertensive chronic kidney disease with stage 5 chronic kidney disease or end stage renal disease: Secondary | ICD-10-CM | POA: Diagnosis not present

## 2016-08-10 DIAGNOSIS — E11319 Type 2 diabetes mellitus with unspecified diabetic retinopathy without macular edema: Secondary | ICD-10-CM | POA: Diagnosis not present

## 2016-08-10 DIAGNOSIS — Z4781 Encounter for orthopedic aftercare following surgical amputation: Secondary | ICD-10-CM | POA: Diagnosis not present

## 2016-08-10 DIAGNOSIS — N186 End stage renal disease: Secondary | ICD-10-CM | POA: Diagnosis not present

## 2016-08-10 NOTE — Telephone Encounter (Signed)
Called to confirm pt got started on Coumadin through his PCP.  Pt confirms starting it last week.   Reports taking 10 mg on Tues/Thurs and 7 mg remaining days.

## 2016-08-11 DIAGNOSIS — N186 End stage renal disease: Secondary | ICD-10-CM | POA: Diagnosis not present

## 2016-08-11 DIAGNOSIS — E1129 Type 2 diabetes mellitus with other diabetic kidney complication: Secondary | ICD-10-CM | POA: Diagnosis not present

## 2016-08-11 DIAGNOSIS — D631 Anemia in chronic kidney disease: Secondary | ICD-10-CM | POA: Diagnosis not present

## 2016-08-11 DIAGNOSIS — N2581 Secondary hyperparathyroidism of renal origin: Secondary | ICD-10-CM | POA: Diagnosis not present

## 2016-08-12 ENCOUNTER — Telehealth (HOSPITAL_COMMUNITY): Payer: Self-pay | Admitting: *Deleted

## 2016-08-12 DIAGNOSIS — E1122 Type 2 diabetes mellitus with diabetic chronic kidney disease: Secondary | ICD-10-CM | POA: Diagnosis not present

## 2016-08-12 DIAGNOSIS — E11319 Type 2 diabetes mellitus with unspecified diabetic retinopathy without macular edema: Secondary | ICD-10-CM | POA: Diagnosis not present

## 2016-08-12 DIAGNOSIS — N186 End stage renal disease: Secondary | ICD-10-CM | POA: Diagnosis not present

## 2016-08-12 DIAGNOSIS — I120 Hypertensive chronic kidney disease with stage 5 chronic kidney disease or end stage renal disease: Secondary | ICD-10-CM | POA: Diagnosis not present

## 2016-08-12 DIAGNOSIS — E1151 Type 2 diabetes mellitus with diabetic peripheral angiopathy without gangrene: Secondary | ICD-10-CM | POA: Diagnosis not present

## 2016-08-12 DIAGNOSIS — Z4781 Encounter for orthopedic aftercare following surgical amputation: Secondary | ICD-10-CM | POA: Diagnosis not present

## 2016-08-12 NOTE — Telephone Encounter (Signed)
Attempted to call patient for upcoming appointment- no answer x 2.  [name]

## 2016-08-13 DIAGNOSIS — D631 Anemia in chronic kidney disease: Secondary | ICD-10-CM | POA: Diagnosis not present

## 2016-08-13 DIAGNOSIS — N2581 Secondary hyperparathyroidism of renal origin: Secondary | ICD-10-CM | POA: Diagnosis not present

## 2016-08-13 DIAGNOSIS — N186 End stage renal disease: Secondary | ICD-10-CM | POA: Diagnosis not present

## 2016-08-13 DIAGNOSIS — E1129 Type 2 diabetes mellitus with other diabetic kidney complication: Secondary | ICD-10-CM | POA: Diagnosis not present

## 2016-08-14 ENCOUNTER — Ambulatory Visit: Payer: Medicare Other

## 2016-08-14 ENCOUNTER — Encounter: Payer: Self-pay | Admitting: *Deleted

## 2016-08-14 ENCOUNTER — Ambulatory Visit (HOSPITAL_COMMUNITY): Payer: Medicare Other | Attending: Cardiology

## 2016-08-14 DIAGNOSIS — R079 Chest pain, unspecified: Secondary | ICD-10-CM | POA: Diagnosis not present

## 2016-08-14 DIAGNOSIS — I10 Essential (primary) hypertension: Secondary | ICD-10-CM | POA: Insufficient documentation

## 2016-08-14 DIAGNOSIS — E1151 Type 2 diabetes mellitus with diabetic peripheral angiopathy without gangrene: Secondary | ICD-10-CM | POA: Diagnosis not present

## 2016-08-14 DIAGNOSIS — Z86718 Personal history of other venous thrombosis and embolism: Secondary | ICD-10-CM | POA: Insufficient documentation

## 2016-08-14 DIAGNOSIS — R9439 Abnormal result of other cardiovascular function study: Secondary | ICD-10-CM | POA: Insufficient documentation

## 2016-08-14 DIAGNOSIS — I4891 Unspecified atrial fibrillation: Secondary | ICD-10-CM | POA: Insufficient documentation

## 2016-08-14 DIAGNOSIS — E1122 Type 2 diabetes mellitus with diabetic chronic kidney disease: Secondary | ICD-10-CM | POA: Diagnosis not present

## 2016-08-14 DIAGNOSIS — Z4781 Encounter for orthopedic aftercare following surgical amputation: Secondary | ICD-10-CM | POA: Diagnosis not present

## 2016-08-14 DIAGNOSIS — E119 Type 2 diabetes mellitus without complications: Secondary | ICD-10-CM | POA: Insufficient documentation

## 2016-08-14 DIAGNOSIS — I120 Hypertensive chronic kidney disease with stage 5 chronic kidney disease or end stage renal disease: Secondary | ICD-10-CM | POA: Diagnosis not present

## 2016-08-14 DIAGNOSIS — E11319 Type 2 diabetes mellitus with unspecified diabetic retinopathy without macular edema: Secondary | ICD-10-CM | POA: Diagnosis not present

## 2016-08-14 DIAGNOSIS — N186 End stage renal disease: Secondary | ICD-10-CM | POA: Diagnosis not present

## 2016-08-14 LAB — MYOCARDIAL PERFUSION IMAGING
LV dias vol: 241 mL (ref 62–150)
LV sys vol: 172 mL
Peak HR: 107 {beats}/min
RATE: 0.44
Rest HR: 105 {beats}/min
SDS: 2
SRS: 9
SSS: 11
TID: 1.06

## 2016-08-14 MED ORDER — TECHNETIUM TC 99M TETROFOSMIN IV KIT
10.3000 | PACK | Freq: Once | INTRAVENOUS | Status: AC | PRN
  Administered 2016-08-14: 10.3 via INTRAVENOUS
  Filled 2016-08-14: qty 11

## 2016-08-14 MED ORDER — TECHNETIUM TC 99M TETROFOSMIN IV KIT
32.2000 | PACK | Freq: Once | INTRAVENOUS | Status: AC | PRN
  Administered 2016-08-14: 32.2 via INTRAVENOUS
  Filled 2016-08-14: qty 33

## 2016-08-14 MED ORDER — REGADENOSON 0.4 MG/5ML IV SOLN
0.4000 mg | Freq: Once | INTRAVENOUS | Status: AC
  Administered 2016-08-14: 0.4 mg via INTRAVENOUS

## 2016-08-14 NOTE — Progress Notes (Signed)
Pt had been scheduled for nurse vist for EKG today as well as myoview today.   Pt did not have nurse visit.  EKG strip was obtained from nuclear ([facility]).  Reviewed with Dr. Burt Knack "no change from 08/05/16 tracing" HR 105  Will have EKG scanned in for review by Dr. Curt Bears.

## 2016-08-15 DIAGNOSIS — D631 Anemia in chronic kidney disease: Secondary | ICD-10-CM | POA: Diagnosis not present

## 2016-08-15 DIAGNOSIS — E1129 Type 2 diabetes mellitus with other diabetic kidney complication: Secondary | ICD-10-CM | POA: Diagnosis not present

## 2016-08-15 DIAGNOSIS — N2581 Secondary hyperparathyroidism of renal origin: Secondary | ICD-10-CM | POA: Diagnosis not present

## 2016-08-15 DIAGNOSIS — N186 End stage renal disease: Secondary | ICD-10-CM | POA: Diagnosis not present

## 2016-08-17 ENCOUNTER — Ambulatory Visit (INDEPENDENT_AMBULATORY_CARE_PROVIDER_SITE_OTHER): Payer: Medicare Other | Admitting: Podiatry

## 2016-08-17 DIAGNOSIS — Z992 Dependence on renal dialysis: Secondary | ICD-10-CM | POA: Diagnosis not present

## 2016-08-17 DIAGNOSIS — Z7901 Long term (current) use of anticoagulants: Secondary | ICD-10-CM | POA: Diagnosis not present

## 2016-08-17 DIAGNOSIS — D631 Anemia in chronic kidney disease: Secondary | ICD-10-CM | POA: Diagnosis not present

## 2016-08-17 DIAGNOSIS — Z89421 Acquired absence of other right toe(s): Secondary | ICD-10-CM | POA: Diagnosis not present

## 2016-08-17 DIAGNOSIS — T8789 Other complications of amputation stump: Secondary | ICD-10-CM | POA: Diagnosis not present

## 2016-08-17 DIAGNOSIS — I70235 Atherosclerosis of native arteries of right leg with ulceration of other part of foot: Secondary | ICD-10-CM

## 2016-08-17 DIAGNOSIS — Z89431 Acquired absence of right foot: Secondary | ICD-10-CM

## 2016-08-17 DIAGNOSIS — I739 Peripheral vascular disease, unspecified: Secondary | ICD-10-CM | POA: Diagnosis not present

## 2016-08-17 DIAGNOSIS — Z4781 Encounter for orthopedic aftercare following surgical amputation: Secondary | ICD-10-CM | POA: Diagnosis not present

## 2016-08-17 DIAGNOSIS — Z8572 Personal history of non-Hodgkin lymphomas: Secondary | ICD-10-CM | POA: Diagnosis not present

## 2016-08-17 DIAGNOSIS — E1122 Type 2 diabetes mellitus with diabetic chronic kidney disease: Secondary | ICD-10-CM | POA: Diagnosis not present

## 2016-08-17 DIAGNOSIS — I120 Hypertensive chronic kidney disease with stage 5 chronic kidney disease or end stage renal disease: Secondary | ICD-10-CM | POA: Diagnosis not present

## 2016-08-17 DIAGNOSIS — Z86718 Personal history of other venous thrombosis and embolism: Secondary | ICD-10-CM | POA: Diagnosis not present

## 2016-08-17 DIAGNOSIS — N186 End stage renal disease: Secondary | ICD-10-CM | POA: Diagnosis not present

## 2016-08-17 DIAGNOSIS — E11319 Type 2 diabetes mellitus with unspecified diabetic retinopathy without macular edema: Secondary | ICD-10-CM | POA: Diagnosis not present

## 2016-08-17 DIAGNOSIS — E785 Hyperlipidemia, unspecified: Secondary | ICD-10-CM | POA: Diagnosis not present

## 2016-08-17 DIAGNOSIS — L97509 Non-pressure chronic ulcer of other part of unspecified foot with unspecified severity: Secondary | ICD-10-CM | POA: Diagnosis not present

## 2016-08-17 DIAGNOSIS — Z794 Long term (current) use of insulin: Secondary | ICD-10-CM | POA: Diagnosis not present

## 2016-08-17 DIAGNOSIS — E1151 Type 2 diabetes mellitus with diabetic peripheral angiopathy without gangrene: Secondary | ICD-10-CM | POA: Diagnosis not present

## 2016-08-17 NOTE — Progress Notes (Signed)
Subjective: Patient presents today for follow-up evaluation of dehiscence to the right transmetatarsal amputation stump right foot. Date of surgery 03/13/2016. Patient states that he is doing well.   Objective: Ulceration is well coapted. Complete reepithelialization has occurred. No sign of ulceration noted. Complete reepithelialization has occurred. No drainage.  Assessment: Status post transmetatarsal amputation right foot. Date of surgery 03/13/2016.   Plan of care: Today the patient has healed. Complete reepithelialization has occurred. Authorization for diabetic shoes initiated today. Return to clinic in 3 months. Compression anklet dispensed.

## 2016-08-18 DIAGNOSIS — E1129 Type 2 diabetes mellitus with other diabetic kidney complication: Secondary | ICD-10-CM | POA: Diagnosis not present

## 2016-08-18 DIAGNOSIS — N2581 Secondary hyperparathyroidism of renal origin: Secondary | ICD-10-CM | POA: Diagnosis not present

## 2016-08-18 DIAGNOSIS — N186 End stage renal disease: Secondary | ICD-10-CM | POA: Diagnosis not present

## 2016-08-18 DIAGNOSIS — D631 Anemia in chronic kidney disease: Secondary | ICD-10-CM | POA: Diagnosis not present

## 2016-08-19 ENCOUNTER — Ambulatory Visit (INDEPENDENT_AMBULATORY_CARE_PROVIDER_SITE_OTHER): Payer: Medicare Other | Admitting: Ophthalmology

## 2016-08-19 DIAGNOSIS — Z4781 Encounter for orthopedic aftercare following surgical amputation: Secondary | ICD-10-CM | POA: Diagnosis not present

## 2016-08-19 DIAGNOSIS — E1122 Type 2 diabetes mellitus with diabetic chronic kidney disease: Secondary | ICD-10-CM | POA: Diagnosis not present

## 2016-08-19 DIAGNOSIS — E11319 Type 2 diabetes mellitus with unspecified diabetic retinopathy without macular edema: Secondary | ICD-10-CM | POA: Diagnosis not present

## 2016-08-19 DIAGNOSIS — N186 End stage renal disease: Secondary | ICD-10-CM | POA: Diagnosis not present

## 2016-08-19 DIAGNOSIS — E1151 Type 2 diabetes mellitus with diabetic peripheral angiopathy without gangrene: Secondary | ICD-10-CM | POA: Diagnosis not present

## 2016-08-19 DIAGNOSIS — I120 Hypertensive chronic kidney disease with stage 5 chronic kidney disease or end stage renal disease: Secondary | ICD-10-CM | POA: Diagnosis not present

## 2016-08-20 DIAGNOSIS — N2581 Secondary hyperparathyroidism of renal origin: Secondary | ICD-10-CM | POA: Diagnosis not present

## 2016-08-20 DIAGNOSIS — D631 Anemia in chronic kidney disease: Secondary | ICD-10-CM | POA: Diagnosis not present

## 2016-08-20 DIAGNOSIS — N186 End stage renal disease: Secondary | ICD-10-CM | POA: Diagnosis not present

## 2016-08-20 DIAGNOSIS — E1129 Type 2 diabetes mellitus with other diabetic kidney complication: Secondary | ICD-10-CM | POA: Diagnosis not present

## 2016-08-22 DIAGNOSIS — E1129 Type 2 diabetes mellitus with other diabetic kidney complication: Secondary | ICD-10-CM | POA: Diagnosis not present

## 2016-08-22 DIAGNOSIS — D631 Anemia in chronic kidney disease: Secondary | ICD-10-CM | POA: Diagnosis not present

## 2016-08-22 DIAGNOSIS — N186 End stage renal disease: Secondary | ICD-10-CM | POA: Diagnosis not present

## 2016-08-22 DIAGNOSIS — N2581 Secondary hyperparathyroidism of renal origin: Secondary | ICD-10-CM | POA: Diagnosis not present

## 2016-08-25 DIAGNOSIS — E1129 Type 2 diabetes mellitus with other diabetic kidney complication: Secondary | ICD-10-CM | POA: Diagnosis not present

## 2016-08-25 DIAGNOSIS — Z7901 Long term (current) use of anticoagulants: Secondary | ICD-10-CM | POA: Diagnosis not present

## 2016-08-25 DIAGNOSIS — N2581 Secondary hyperparathyroidism of renal origin: Secondary | ICD-10-CM | POA: Diagnosis not present

## 2016-08-25 DIAGNOSIS — I80202 Phlebitis and thrombophlebitis of unspecified deep vessels of left lower extremity: Secondary | ICD-10-CM | POA: Diagnosis not present

## 2016-08-25 DIAGNOSIS — I4891 Unspecified atrial fibrillation: Secondary | ICD-10-CM | POA: Diagnosis not present

## 2016-08-25 DIAGNOSIS — D631 Anemia in chronic kidney disease: Secondary | ICD-10-CM | POA: Diagnosis not present

## 2016-08-25 DIAGNOSIS — N186 End stage renal disease: Secondary | ICD-10-CM | POA: Diagnosis not present

## 2016-08-26 DIAGNOSIS — I120 Hypertensive chronic kidney disease with stage 5 chronic kidney disease or end stage renal disease: Secondary | ICD-10-CM | POA: Diagnosis not present

## 2016-08-26 DIAGNOSIS — E1122 Type 2 diabetes mellitus with diabetic chronic kidney disease: Secondary | ICD-10-CM | POA: Diagnosis not present

## 2016-08-26 DIAGNOSIS — Z4781 Encounter for orthopedic aftercare following surgical amputation: Secondary | ICD-10-CM | POA: Diagnosis not present

## 2016-08-26 DIAGNOSIS — E11319 Type 2 diabetes mellitus with unspecified diabetic retinopathy without macular edema: Secondary | ICD-10-CM | POA: Diagnosis not present

## 2016-08-26 DIAGNOSIS — N186 End stage renal disease: Secondary | ICD-10-CM | POA: Diagnosis not present

## 2016-08-26 DIAGNOSIS — E1151 Type 2 diabetes mellitus with diabetic peripheral angiopathy without gangrene: Secondary | ICD-10-CM | POA: Diagnosis not present

## 2016-08-27 DIAGNOSIS — N2581 Secondary hyperparathyroidism of renal origin: Secondary | ICD-10-CM | POA: Diagnosis not present

## 2016-08-27 DIAGNOSIS — E1129 Type 2 diabetes mellitus with other diabetic kidney complication: Secondary | ICD-10-CM | POA: Diagnosis not present

## 2016-08-27 DIAGNOSIS — D631 Anemia in chronic kidney disease: Secondary | ICD-10-CM | POA: Diagnosis not present

## 2016-08-27 DIAGNOSIS — N186 End stage renal disease: Secondary | ICD-10-CM | POA: Diagnosis not present

## 2016-08-29 DIAGNOSIS — D631 Anemia in chronic kidney disease: Secondary | ICD-10-CM | POA: Diagnosis not present

## 2016-08-29 DIAGNOSIS — N186 End stage renal disease: Secondary | ICD-10-CM | POA: Diagnosis not present

## 2016-08-29 DIAGNOSIS — E1129 Type 2 diabetes mellitus with other diabetic kidney complication: Secondary | ICD-10-CM | POA: Diagnosis not present

## 2016-08-29 DIAGNOSIS — N2581 Secondary hyperparathyroidism of renal origin: Secondary | ICD-10-CM | POA: Diagnosis not present

## 2016-08-29 DIAGNOSIS — Z992 Dependence on renal dialysis: Secondary | ICD-10-CM | POA: Diagnosis not present

## 2016-09-01 DIAGNOSIS — D631 Anemia in chronic kidney disease: Secondary | ICD-10-CM | POA: Diagnosis not present

## 2016-09-01 DIAGNOSIS — N186 End stage renal disease: Secondary | ICD-10-CM | POA: Diagnosis not present

## 2016-09-01 DIAGNOSIS — N2581 Secondary hyperparathyroidism of renal origin: Secondary | ICD-10-CM | POA: Diagnosis not present

## 2016-09-01 DIAGNOSIS — E1129 Type 2 diabetes mellitus with other diabetic kidney complication: Secondary | ICD-10-CM | POA: Diagnosis not present

## 2016-09-03 DIAGNOSIS — D631 Anemia in chronic kidney disease: Secondary | ICD-10-CM | POA: Diagnosis not present

## 2016-09-03 DIAGNOSIS — E1129 Type 2 diabetes mellitus with other diabetic kidney complication: Secondary | ICD-10-CM | POA: Diagnosis not present

## 2016-09-03 DIAGNOSIS — N2581 Secondary hyperparathyroidism of renal origin: Secondary | ICD-10-CM | POA: Diagnosis not present

## 2016-09-03 DIAGNOSIS — N186 End stage renal disease: Secondary | ICD-10-CM | POA: Diagnosis not present

## 2016-09-04 ENCOUNTER — Other Ambulatory Visit: Payer: Self-pay | Admitting: Cardiology

## 2016-09-04 ENCOUNTER — Encounter: Payer: Self-pay | Admitting: *Deleted

## 2016-09-04 ENCOUNTER — Encounter: Payer: Self-pay | Admitting: Cardiology

## 2016-09-04 ENCOUNTER — Ambulatory Visit (INDEPENDENT_AMBULATORY_CARE_PROVIDER_SITE_OTHER): Payer: Medicare Other | Admitting: Cardiology

## 2016-09-04 VITALS — BP 110/50 | HR 102 | Ht 75.5 in | Wt 216.2 lb

## 2016-09-04 DIAGNOSIS — Z01812 Encounter for preprocedural laboratory examination: Secondary | ICD-10-CM | POA: Diagnosis not present

## 2016-09-04 DIAGNOSIS — I480 Paroxysmal atrial fibrillation: Secondary | ICD-10-CM | POA: Diagnosis not present

## 2016-09-04 DIAGNOSIS — I70235 Atherosclerosis of native arteries of right leg with ulceration of other part of foot: Secondary | ICD-10-CM

## 2016-09-04 DIAGNOSIS — I5020 Unspecified systolic (congestive) heart failure: Secondary | ICD-10-CM

## 2016-09-04 DIAGNOSIS — R079 Chest pain, unspecified: Secondary | ICD-10-CM

## 2016-09-04 NOTE — Progress Notes (Signed)
Electrophysiology Office Note   Date:  09/04/2016   ID:  Hulen, Mandler 10-Feb-1951, MRN [id number]  PCP:  Geoffery Lyons, MD  Primary Electrophysiologist:  Constance Haw, MD    Chief Complaint  Patient presents with  . Palpitations     History of Present Illness: [name] is a 66 y.o. male who presents today for electrophysiology evaluation.   He has a past history of CK D on dialysis, diabetes, DVT, hyperlipidemia, hypertension, and SVT. He was put on Coumadin for a DVT with filter placed in 2014. It appears that he potentially had a diagnosis of atrial fibrillation in 2016 while on his Coumadin. Recent echo showed severe biventricular dysfunction with an EF of 15-20%. He was in atrial fibrillation at the time of the echo. A Myoview showed a fixed medium size apical inferolateral perfusion defect that could represent infarction without ischemia. He continues to have chest pain when he is being dialyzed. At times he requires oxygen to relieve the chest discomfort. Otherwise he does not complain of shortness of breath.   Today, he denies symptoms of palpitations, shortness of breath, orthopnea, PND, lower extremity edema, claudication, dizziness, presyncope, syncope, bleeding, or neurologic sequela. The patient is tolerating medications without difficulties and is otherwise without complaint today.    Past Medical History:  Diagnosis Date  . Anemia   . Arthritis    HNP- lumbar, "all over my body"  . Blood transfusion    "years ago; blood was low" (08/05/2013)  . CKD (chronic kidney disease) stage 4, GFR 15-29 ml/min (HCC) 03/18/2012   [place]- T,TH,Sat.  . Diabetic nephropathy ([place])   . Diabetic retinopathy   . DVT (deep venous thrombosis) ([place])    "got one in my right leg now; I've had one before too, not sure which leg" (08/05/2013)  . ESRD (end stage renal disease) on dialysis [facility])    "just started today, (08/04/2013)"  . Family history of  anesthesia complication    " my son wakes up slowly"  . GERD (gastroesophageal reflux disease)    uses alka seltzere on occas.   [name])    "one q now and then" (08/05/2013)  . Hyperlipidemia   . Hypertension   . 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 Non Hodgkin's lymphoma ([place])    Tx 2009; "had chemo; it went away" (08/05/2013)  . Noncompliance 03/16/2012  . NSVT (nonsustained ventricular tachycardia) ([place]) 03/18/2012  . Peripheral vascular disease ([place])   . Pneumonia 2013   hosp.-   . Poor historian    pt. unsure of several answers to health history questions   . Skin cancer    melanoma - head  . Sleep apnea    "suppose to have a sleep study, but they never told me when. (08/05/2013)   Past Surgical History:  Procedure Laterality Date  . ACHILLES TENDON SURGERY Right 03/13/2016   Procedure: ACHILLES LENGTHENING/KIDNER;  Surgeon: Edrick Kins, DPM;  Location: [place];  Service: Podiatry;  Laterality: Right;  . AV FISTULA PLACEMENT Left 02/03/2013   Procedure: ARTERIOVENOUS (AV) FISTULA CREATION- LEFT RADIAL CEPHALIC; ULTRASOUND GUIDED;  Surgeon: Mal Misty, MD;  Location: [facility] OR;  Service: Vascular;  Laterality: Left;  . AV FISTULA PLACEMENT Right 11/08/2015   Procedure: RIGHT BRACHIOCEPHALIC ARTERIOVENOUS (AV) FISTULA CREATION;  Surgeon: Serafina Mitchell, MD;  Location: [place];  Service:  Vascular;  Laterality: Right;  . BASCILIC VEIN TRANSPOSITION Right 01/24/2016   Procedure: RIGHT SECOND STAGE BASILIC VEIN TRANSPOSITION;  Surgeon: Angelia Mould, MD;  Location: [place];  Service: Vascular;  Laterality: Right;  . CARDIAC CATHETERIZATION    . COLONOSCOPY N/A 09/23/2015   Procedure: COLONOSCOPY;  Surgeon: Irene Shipper, MD;  Location: [facility];  Service: Endoscopy;  Laterality: N/A;  . Coloscopy    . EYE SURGERY Bilateral   . GAS INSERTION  05/10/2012   Procedure: INSERTION OF GAS;   Surgeon: Hayden Pedro, MD;  Location: [place];  Service: Ophthalmology;  Laterality: Right;  . LESION EXCISION Right 05/08/2015   Procedure: EXCISION SCALP LESION;  Surgeon: Erroll Luna, MD;  Location: [place];  Service: General;  Laterality: Right;  . MEMBRANE PEEL  05/10/2012   Procedure: MEMBRANE PEEL;  Surgeon: Hayden Pedro, MD;  Location: [place];  Service: Ophthalmology;  Laterality: Right;  . PARS PLANA VITRECTOMY  08/27/2011   Procedure: PARS PLANA VITRECTOMY WITH 25 GAUGE;  Surgeon: Hayden Pedro, MD;  Location: [place];  Service: Ophthalmology;  Laterality: Left;  Repair of complex traction retinal detachment left eye  . PARS PLANA VITRECTOMY  05/10/2012   Procedure: PARS PLANA VITRECTOMY WITH 25 GAUGE;  Surgeon: Hayden Pedro, MD;  Location: [place];  Service: Ophthalmology;  Laterality: Right;  Repair Complex Traction Retinal Detachment  . PHOTOCOAGULATION WITH LASER  05/10/2012   Procedure: PHOTOCOAGULATION WITH LASER;  Surgeon: Hayden Pedro, MD;  Location: [place];  Service: Ophthalmology;  Laterality: Right;  . PORT-A-CATH REMOVAL    . PORTACATH PLACEMENT    . TRANSMETATARSAL AMPUTATION Right 03/13/2016   Procedure: TRANSMETATARSAL AMPUTATION;  Surgeon: Edrick Kins, DPM;  Location: [place];  Service: Podiatry;  Laterality: Right;  . VENA CAVA FILTER PLACEMENT  09/2012   due to preparation for surgery     Current Outpatient Prescriptions  Medication Sig Dispense Refill  . acetaminophen (TYLENOL) 325 MG tablet Take 650 mg by mouth every 6 (six) hours as needed (pain).    Marland Kitchen diclofenac sodium (VOLTAREN) 1 % GEL Apply 1 application topically 4 (four) times daily.     Marland Kitchen doxycycline (VIBRA-TABS) 100 MG tablet Take 1 tablet (100 mg total) by mouth every 12 (twelve) hours. 10 tablet 0  . ergocalciferol (VITAMIN D2) 50000 units capsule Take 50,000 Units by mouth once a week.    Marland Kitchen HYDROcodone-acetaminophen (NORCO/VICODIN) 5-325 MG tablet Take 1 tablet by mouth every 6 (six) hours as  needed for moderate pain. 20 tablet 0  . insulin glargine (LANTUS) 100 UNIT/ML injection Inject 0.15 mLs (15 Units total) into the skin at bedtime. 10 mL 11  . lidocaine-prilocaine (EMLA) cream Apply 1 application topically as needed (Apply small amount to access site 1-2 hours before dialysis. Cover with occlusive dressing (saran wrap)).     . metoprolol succinate (TOPROL-XL) 25 MG 24 hr tablet Take 1 tablet (25 mg total) by mouth daily. Take with or immediately following a meal. 90 tablet 3  . multivitamin (RENA-VIT) TABS tablet Take 1 tablet by mouth at bedtime. 30 tablet 0  . oxyCODONE (OXY IR/ROXICODONE) 5 MG immediate release tablet Take 1 tablet (5 mg total) by mouth every 6 (six) hours as needed for moderate pain. 30 tablet 0  . oxyCODONE-acetaminophen (PERCOCET/ROXICET) 5-325 MG tablet Take 1 tablet by mouth every 6 (six) hours as needed (pain).     . polysaccharide iron (NIFEREX) 150 MG CAPS capsule Take 1  capsule (150 mg total) by mouth daily. 30 each 2  . sevelamer carbonate (RENVELA) 800 MG tablet Take 1,600 mg by mouth 3 (three) times daily with meals.    Marland Kitchen sulfamethoxazole-trimethoprim (BACTRIM DS,SEPTRA DS) 800-160 MG tablet Take 1 tablet by mouth 2 (two) times daily. 28 tablet 0  . warfarin (COUMADIN) 5 MG tablet Take as directed      No current facility-administered medications for this visit.     Allergies:   No known allergies   Social History:  The patient  reports that he has never smoked. He has never used smokeless tobacco. He reports that he does not drink alcohol or use drugs.   Family History:  The patient's family history includes Anesthesia problems in his son; Diabetes in his father; Hypertension in his father and son; Other in his father.    ROS:  Please see the history of present illness.   Otherwise, review of systems is positive for A tight change, chest pain, leg swelling, leg pain, diarrhea, back pain, muscle pain, headaches.   All other systems are reviewed  and negative.    PHYSICAL EXAM: VS:  BP (!) 110/50   Pulse (!) 102   Ht 6' 3.5" (1.918 m)   Wt 216 lb 3.2 oz (98.1 kg)   SpO2 99%   BMI 26.67 kg/m  , BMI Body mass index is 26.67 kg/m. GEN: Well nourished, well developed, in no acute distress  HEENT: normal  Neck: no JVD, carotid bruits, or masses Cardiac: tachycardic, regular; no murmurs, rubs, or gallops,no edema  Respiratory:  clear to auscultation bilaterally, normal work of breathing GI: soft, nontender, nondistended, + BS MS: no deformity or atrophy  Skin: warm and dry,  Neuro:  Strength and sensation are intact Psych: euthymic mood, full affect  EKG:  EKG is ordered today. Personal review of the ekg ordered shows wide complex tachycardia, possible atrial flutter vs atrial tachycardia, RBBB, LAFB  Recent Labs: 03/06/2016: ALT 14 03/16/2016: BUN 32; Creatinine, Ser 6.16; Potassium 4.3; Sodium 134 03/17/2016: Hemoglobin 9.2; Platelets 340    Lipid Panel     Component Value Date/Time   CHOL 129 01/14/2011 0437   TRIG 137 01/14/2011 0437   HDL 13 (L) 01/14/2011 0437   CHOLHDL 9.9 01/14/2011 0437   VLDL 27 01/14/2011 0437   LDLCALC 89 01/14/2011 0437     Wt Readings from Last 3 Encounters:  09/04/16 216 lb 3.2 oz (98.1 kg)  08/05/16 223 lb (101.2 kg)  07/22/16 219 lb 9.6 oz (99.6 kg)      Other studies Reviewed: Additional studies/ records that were reviewed today include: Myoview 08/14/16 Review of the above records today demonstrates:    Nuclear stress EF: 29%.  There was no ST segment deviation noted during stress.  This is a high risk study.  The left ventricular ejection fraction is severely decreased (<30%).   1. EF 29%, diffuse hypokinesis.  2. Fixed, medium-sized, mild-intensity basal to apical inferolateral perfusion defect.  This could represent prior myocardial infarction, no ischemia.  3. High risk study primarily due to low EF.   TTE 08/05/16 - Left ventricle: The cavity size was mildly  dilated. There was   mild concentric hypertrophy. Systolic function was severely   reduced. The estimated ejection fraction was in the range of   15-20%. Diffuse hypokinesis. The study was not technically   sufficient to allow evaluation of LV diastolic dysfunction due to   atrial fibrillation. - Aortic valve: Trileaflet; normal thickness  leaflets. There was no   regurgitation. - Aortic root: The aortic root was normal in size. - Mitral valve: There was mild regurgitation. - Left atrium: The atrium was severely dilated. - Right ventricle: The cavity size was mildly dilated. Wall   thickness was normal. Systolic function was severely reduced. - Right atrium: The atrium was moderately dilated. - Tricuspid valve: There was moderate regurgitation. - Pulmonary arteries: Systolic pressure was mildly increased. PA   peak pressure: 45 mm Hg (S). - Inferior vena cava: The vessel was dilated. The respirophasic   diameter changes were blunted (< 50%), consistent with elevated   central venous pressure. - Pericardium, extracardiac: There was no pericardial effusion.  ASSESSMENT AND PLAN:  1.  Atrial fibrillation: Likely persistent. Currently on Coumadin. We'll have to stop the Coumadin is planning a left heart catheterization.  This patients CHA2DS2-VASc Score and unadjusted Ischemic Stroke Rate (% per year) is equal to 3.2 % stroke rate/year from a score of 3  Above score calculated as 1 point each if present [CHF, HTN, DM, Vascular=MI/PAD/Aortic Plaque, Age if 65-74, or Male] Above score calculated as 2 points each if present [Age > 75, or Stroke/TIA/TE]   2. Hypertension: Well-controlled today. Managed by primary care.  3. Chest pain: Myoview shows a low ejection fraction with evidence of a prior infarction. He is continuing to have chest pain with dialysis. We'll plan for left heart catheterization to further define his coronary anatomy. Risks and benefits were discussed. Risks include  bleeding, tamponade, heart attack, and stroke. He understands these risks and has agreed to the procedure.  4. Chronic systolic heart failure: Ejection fraction is significantly decreased with biventricular dysfunction. He is currently on metoprolol. It is possible that this is an ischemic cardiomyopathy also possible that his tachycardia-related. Unfortunately, he is on dialysis and and her is unlikely to tolerate further titration of his medications. Maybe start BiDil and hydralazine in the future.   Current medicines are reviewed at length with the patient today.   The patient does not have concerns regarding his medicines.  The following changes were made today:  none  Labs/ tests ordered today include:  Orders Placed This Encounter  Procedures  . EKG 12-Lead     Disposition:   FU with Dsean Vantol 1.5 months  Signed, Alexcia Schools [name], MD  09/04/2016 3:35 PM     [facility] [address] [place] [address] [phone] (office) [phone] (fax)

## 2016-09-04 NOTE — Patient Instructions (Signed)
Medication Instructions:    Your physician has recommended you make the following change in your medication:  1) Take your last dose of Coumadin on 4/7.  They will instruct you when to restart it after your catheterization.  --- If you need a refill on your cardiac medications before your next appointment, please call your pharmacy. ---  Labwork:  Pre procedure labs today: BMET, CBC w/ diff & INR  Testing/Procedures: Your physician has requested that you have a cardiac catheterization. Cardiac catheterization is used to diagnose and/or treat various heart conditions. Doctors may recommend this procedure for a number of different reasons. The most common reason is to evaluate chest pain. Chest pain can be a symptom of coronary artery disease (CAD), and cardiac catheterization can show whether plaque is narrowing or blocking your heart's arteries. This procedure is also used to evaluate the valves, as well as measure the blood flow and oxygen levels in different parts of your heart. For further information please visit [email or web address]. Please follow instruction sheet, as given.  Follow-Up:  Your physician recommends that you schedule a follow-up appointment in: 1 1/2 months with Dr. Curt Bears  Thank you for choosing [facility]!!   Trinidad Curet, RN [phone]    Any Other Special Instructions Will Be Listed Below (If Applicable).   Coronary Angiogram A coronary angiogram is an X-ray procedure that is used to examine the arteries in the heart. In this procedure, a dye (contrast dye) is injected through a long, thin tube (catheter). The catheter is inserted through the groin, wrist, or arm. The dye is injected into each artery, then X-rays are taken to show if there is a blockage in the arteries of the heart. This procedure can also show if you have valve disease or a disease of the aorta, and it can be used to check the overall function of your heart muscle. You may have a  coronary angiogram if:  You are having chest pain, or other symptoms of angina, and you are at risk for heart disease.  You have an abnormal electrocardiogram (ECG) or stress test.  You have chest pain and heart failure.  You are having irregular heart rhythms.  You and your health care provider determine that the benefits of the test information outweigh the risks of the procedure. Let your health care provider know about:  Any allergies you have, including allergies to contrast dye.  All medicines you are taking, including vitamins, herbs, eye drops, creams, and over-the-counter medicines.  Any problems you or family members have had with anesthetic medicines.  Any blood disorders you have.  Any surgeries you have had.  History of kidney problems or kidney failure.  Any medical conditions you have.  Whether you are pregnant or may be pregnant. What are the risks? Generally, this is a safe procedure. However, problems may occur, including:  Infection.  Allergic reaction to medicines or dyes that are used.  Bleeding from the access site or other locations.  Kidney injury, especially in people with impaired kidney function.  Stroke (rare).  Heart attack (rare).  Damage to other structures or organs. What happens before the procedure? Staying hydrated  Follow instructions from your health care provider about hydration, which may include:  Up to 2 hours before the procedure - you may continue to drink clear liquids, such as water, clear fruit juice, black coffee, and plain tea. Eating and drinking restrictions  Follow instructions from your health care provider about eating and drinking, which may  include:  8 hours before the procedure - stop eating heavy meals or foods such as meat, fried foods, or fatty foods.  6 hours before the procedure - stop eating light meals or foods, such as toast or cereal.  2 hours before the procedure - stop drinking clear  liquids. General instructions   Ask your health care provider about:  Changing or stopping your regular medicines. This is especially important if you are taking diabetes medicines or blood thinners.  Taking medicines such as ibuprofen. These medicines can thin your blood. Do not take these medicines before your procedure if your health care provider instructs you not to, though aspirin may be recommended prior to coronary angiograms.  Plan to have someone take you home from the hospital or clinic.  You may need to have blood tests or X-rays done. What happens during the procedure?  An IV tube will be inserted into one of your veins.  You will be given one or more of the following:  A medicine to help you relax (sedative).  A medicine to numb the area where the catheter will be inserted into an artery (local anesthetic).  To reduce your risk of infection:  Your health care team will wash or sanitize their hands.  Your skin will be washed with soap.  Hair may be removed from the area where the catheter will be inserted.  You will be connected to a continuous ECG monitor.  The catheter will be inserted into an artery. The location may be in your groin, in your wrist, or in the fold of your arm (near your elbow).  A type of X-ray (fluoroscopy) will be used to help guide the catheter to the opening of the blood vessel that is being examined.  A dye will be injected into the catheter, and X-rays will be taken. The dye will help to show where any narrowing or blockages are located in the heart arteries.  Tell your health care provider if you have any chest pain or trouble breathing during the procedure.  If blockages are found, your health care provider may perform another procedure, such as inserting a coronary stent. The procedure may vary among health care providers and hospitals. What happens after the procedure?  After the procedure, you will need to keep the area still for  a few hours, or for as long as told by your health care provider. If the procedure is done through the groin, you will be instructed to not bend and not cross your legs.  The insertion site will be checked frequently.  The pulse in your foot or wrist will be checked frequently.  You may have additional blood tests, X-rays, and a test that records the electrical activity of your heart (ECG).  Do not drive for 24 hours if you were given a sedative. Summary  A coronary angiogram is an X-ray procedure that is used to look into the arteries in the heart.  During the procedure, a dye (contrast dye) is injected through a long, thin tube (catheter). The catheter is inserted through the groin, wrist, or arm.  Tell your health care provider about any allergies you have, including allergies to contrast dye.  After the procedure, you will need to keep the area still for a few hours, or for as long as told by your health care provider. This information is not intended to replace advice given to you by your health care provider. Make sure you discuss any questions you have with  your health care provider. Document Released: 11/22/2002 Document Revised: 02/28/2016 Document Reviewed: 02/28/2016 Elsevier Interactive Patient Education  2017 [employer].

## 2016-09-05 DIAGNOSIS — E1129 Type 2 diabetes mellitus with other diabetic kidney complication: Secondary | ICD-10-CM | POA: Diagnosis not present

## 2016-09-05 DIAGNOSIS — D631 Anemia in chronic kidney disease: Secondary | ICD-10-CM | POA: Diagnosis not present

## 2016-09-05 DIAGNOSIS — N2581 Secondary hyperparathyroidism of renal origin: Secondary | ICD-10-CM | POA: Diagnosis not present

## 2016-09-05 DIAGNOSIS — N186 End stage renal disease: Secondary | ICD-10-CM | POA: Diagnosis not present

## 2016-09-05 LAB — BASIC METABOLIC PANEL
BUN/Creatinine Ratio: 4 — ABNORMAL LOW (ref 10–24)
BUN: 22 mg/dL (ref 8–27)
CO2: 24 mmol/L (ref 18–29)
Calcium: 8.9 mg/dL (ref 8.6–10.2)
Chloride: 97 mmol/L (ref 96–106)
Creatinine, Ser: 4.96 mg/dL — ABNORMAL HIGH (ref 0.76–1.27)
GFR calc Af Amer: 13 mL/min/{1.73_m2} — ABNORMAL LOW (ref >59)
GFR calc non Af Amer: 12 mL/min/{1.73_m2} — ABNORMAL LOW (ref >59)
Glucose: 87 mg/dL (ref 65–99)
Potassium: 4.7 mmol/L (ref 3.5–5.2)
Sodium: 142 mmol/L (ref 134–144)

## 2016-09-05 LAB — CBC WITH DIFFERENTIAL/PLATELET
Basophils Absolute: 0 10*3/uL (ref 0.0–0.2)
Basos: 0 %
EOS (ABSOLUTE): 0.1 10*3/uL (ref 0.0–0.4)
Eos: 2 %
Hematocrit: 36.3 % — ABNORMAL LOW (ref 37.5–51.0)
Hemoglobin: 11.7 g/dL — ABNORMAL LOW (ref 13.0–17.7)
Immature Grans (Abs): 0 10*3/uL (ref 0.0–0.1)
Immature Granulocytes: 0 %
Lymphocytes Absolute: 0.9 10*3/uL (ref 0.7–3.1)
Lymphs: 36 %
MCH: 27.2 pg (ref 26.6–33.0)
MCHC: 32.2 g/dL (ref 31.5–35.7)
MCV: 84 fL (ref 79–97)
Monocytes Absolute: 0.2 10*3/uL (ref 0.1–0.9)
Monocytes: 7 %
Neutrophils Absolute: 1.3 10*3/uL — ABNORMAL LOW (ref 1.4–7.0)
Neutrophils: 55 %
Platelets: 153 10*3/uL (ref 150–379)
RBC: 4.3 x10E6/uL (ref 4.14–5.80)
RDW: 18.2 % — ABNORMAL HIGH (ref 12.3–15.4)
WBC: 2.5 10*3/uL — CL (ref 3.4–10.8)

## 2016-09-05 LAB — PROTIME-INR
INR: 2.7 — ABNORMAL HIGH (ref 0.8–1.2)
Prothrombin Time: 26.6 s — ABNORMAL HIGH (ref 9.1–12.0)

## 2016-09-08 DIAGNOSIS — D631 Anemia in chronic kidney disease: Secondary | ICD-10-CM | POA: Diagnosis not present

## 2016-09-08 DIAGNOSIS — N186 End stage renal disease: Secondary | ICD-10-CM | POA: Diagnosis not present

## 2016-09-08 DIAGNOSIS — E1129 Type 2 diabetes mellitus with other diabetic kidney complication: Secondary | ICD-10-CM | POA: Diagnosis not present

## 2016-09-08 DIAGNOSIS — N2581 Secondary hyperparathyroidism of renal origin: Secondary | ICD-10-CM | POA: Diagnosis not present

## 2016-09-09 ENCOUNTER — Other Ambulatory Visit: Payer: Self-pay | Admitting: *Deleted

## 2016-09-09 MED ORDER — METOPROLOL SUCCINATE ER 25 MG PO TB24: 25.0000 mg | ORAL_TABLET | Freq: Every day | ORAL | 3 refills | Status: DC

## 2016-09-10 DIAGNOSIS — E1129 Type 2 diabetes mellitus with other diabetic kidney complication: Secondary | ICD-10-CM | POA: Diagnosis not present

## 2016-09-10 DIAGNOSIS — D631 Anemia in chronic kidney disease: Secondary | ICD-10-CM | POA: Diagnosis not present

## 2016-09-10 DIAGNOSIS — N2581 Secondary hyperparathyroidism of renal origin: Secondary | ICD-10-CM | POA: Diagnosis not present

## 2016-09-10 DIAGNOSIS — N186 End stage renal disease: Secondary | ICD-10-CM | POA: Diagnosis not present

## 2016-09-11 ENCOUNTER — Encounter (HOSPITAL_COMMUNITY): Payer: Self-pay | Admitting: Interventional Cardiology

## 2016-09-11 ENCOUNTER — Encounter (HOSPITAL_COMMUNITY)
Admission: RE | Disposition: A | Discharge status: Home / Self Care | Payer: Self-pay | Source: Ambulatory Visit | Attending: Interventional Cardiology

## 2016-09-11 ENCOUNTER — Ambulatory Visit (HOSPITAL_COMMUNITY)
Admission: RE | Admit: 2016-09-11 | Discharge: 2016-09-11 | Disposition: A | Discharge status: Home / Self Care | Payer: Medicare Other | Source: Ambulatory Visit | Attending: Interventional Cardiology | Admitting: Interventional Cardiology

## 2016-09-11 DIAGNOSIS — Z794 Long term (current) use of insulin: Secondary | ICD-10-CM | POA: Diagnosis not present

## 2016-09-11 DIAGNOSIS — R0789 Other chest pain: Secondary | ICD-10-CM | POA: Diagnosis not present

## 2016-09-11 DIAGNOSIS — I2510 Atherosclerotic heart disease of native coronary artery without angina pectoris: Secondary | ICD-10-CM | POA: Insufficient documentation

## 2016-09-11 DIAGNOSIS — I471 Supraventricular tachycardia: Secondary | ICD-10-CM | POA: Diagnosis not present

## 2016-09-11 DIAGNOSIS — E785 Hyperlipidemia, unspecified: Secondary | ICD-10-CM | POA: Diagnosis not present

## 2016-09-11 DIAGNOSIS — Z8249 Family history of ischemic heart disease and other diseases of the circulatory system: Secondary | ICD-10-CM | POA: Insufficient documentation

## 2016-09-11 DIAGNOSIS — Z9221 Personal history of antineoplastic chemotherapy: Secondary | ICD-10-CM | POA: Diagnosis not present

## 2016-09-11 DIAGNOSIS — I5022 Chronic systolic (congestive) heart failure: Secondary | ICD-10-CM | POA: Insufficient documentation

## 2016-09-11 DIAGNOSIS — R079 Chest pain, unspecified: Secondary | ICD-10-CM | POA: Diagnosis present

## 2016-09-11 DIAGNOSIS — E1122 Type 2 diabetes mellitus with diabetic chronic kidney disease: Secondary | ICD-10-CM | POA: Insufficient documentation

## 2016-09-11 DIAGNOSIS — E1151 Type 2 diabetes mellitus with diabetic peripheral angiopathy without gangrene: Secondary | ICD-10-CM | POA: Diagnosis not present

## 2016-09-11 DIAGNOSIS — D631 Anemia in chronic kidney disease: Secondary | ICD-10-CM | POA: Diagnosis not present

## 2016-09-11 DIAGNOSIS — Z8673 Personal history of transient ischemic attack (TIA), and cerebral infarction without residual deficits: Secondary | ICD-10-CM | POA: Diagnosis not present

## 2016-09-11 DIAGNOSIS — N186 End stage renal disease: Secondary | ICD-10-CM | POA: Insufficient documentation

## 2016-09-11 DIAGNOSIS — I4891 Unspecified atrial fibrillation: Secondary | ICD-10-CM | POA: Insufficient documentation

## 2016-09-11 DIAGNOSIS — R9439 Abnormal result of other cardiovascular function study: Secondary | ICD-10-CM | POA: Diagnosis present

## 2016-09-11 DIAGNOSIS — E11319 Type 2 diabetes mellitus with unspecified diabetic retinopathy without macular edema: Secondary | ICD-10-CM | POA: Insufficient documentation

## 2016-09-11 DIAGNOSIS — K219 Gastro-esophageal reflux disease without esophagitis: Secondary | ICD-10-CM | POA: Insufficient documentation

## 2016-09-11 DIAGNOSIS — Z8572 Personal history of non-Hodgkin lymphomas: Secondary | ICD-10-CM | POA: Insufficient documentation

## 2016-09-11 DIAGNOSIS — I132 Hypertensive heart and chronic kidney disease with heart failure and with stage 5 chronic kidney disease, or end stage renal disease: Secondary | ICD-10-CM | POA: Insufficient documentation

## 2016-09-11 DIAGNOSIS — E1121 Type 2 diabetes mellitus with diabetic nephropathy: Secondary | ICD-10-CM | POA: Diagnosis present

## 2016-09-11 DIAGNOSIS — M1990 Unspecified osteoarthritis, unspecified site: Secondary | ICD-10-CM | POA: Insufficient documentation

## 2016-09-11 DIAGNOSIS — Z7901 Long term (current) use of anticoagulants: Secondary | ICD-10-CM | POA: Diagnosis not present

## 2016-09-11 DIAGNOSIS — G4730 Sleep apnea, unspecified: Secondary | ICD-10-CM | POA: Diagnosis not present

## 2016-09-11 DIAGNOSIS — I5020 Unspecified systolic (congestive) heart failure: Secondary | ICD-10-CM

## 2016-09-11 DIAGNOSIS — Z992 Dependence on renal dialysis: Secondary | ICD-10-CM | POA: Insufficient documentation

## 2016-09-11 HISTORY — PX: LEFT HEART CATH AND CORONARY ANGIOGRAPHY: CATH118249

## 2016-09-11 LAB — GLUCOSE, CAPILLARY
Glucose-Capillary: 67 mg/dL (ref 65–99)
Glucose-Capillary: 87 mg/dL (ref 65–99)
Glucose-Capillary: 88 mg/dL (ref 65–99)
Glucose-Capillary: 55 mg/dL — ABNORMAL LOW (ref 65–99)
Glucose-Capillary: 73 mg/dL (ref 65–99)

## 2016-09-11 LAB — PROTIME-INR
INR: 1.38
Prothrombin Time: 17.1 seconds — ABNORMAL HIGH (ref 11.4–15.2)

## 2016-09-11 SURGERY — LEFT HEART CATH AND CORONARY ANGIOGRAPHY
Anesthesia: LOCAL

## 2016-09-11 MED ORDER — LIDOCAINE HCL (PF) 1 % IJ SOLN
INTRAMUSCULAR | Status: DC | PRN
  Administered 2016-09-11: 15 mL

## 2016-09-11 MED ORDER — HEPARIN (PORCINE) IN NACL 2-0.9 UNIT/ML-% IJ SOLN
INTRAMUSCULAR | Status: DC | PRN
  Administered 2016-09-11: 1000 mL

## 2016-09-11 MED ORDER — ASPIRIN 81 MG PO CHEW
CHEWABLE_TABLET | ORAL | Status: AC
  Administered 2016-09-11: 81 mg via ORAL
  Filled 2016-09-11: qty 1

## 2016-09-11 MED ORDER — SODIUM CHLORIDE 0.9% FLUSH: 3.0000 mL | INTRAVENOUS | Status: DC | PRN

## 2016-09-11 MED ORDER — ASPIRIN 81 MG PO CHEW
81.0000 mg | CHEWABLE_TABLET | ORAL | Status: AC
  Administered 2016-09-11: 81 mg via ORAL

## 2016-09-11 MED ORDER — HEPARIN (PORCINE) IN NACL 2-0.9 UNIT/ML-% IJ SOLN
INTRAMUSCULAR | Status: AC
  Filled 2016-09-11: qty 1000

## 2016-09-11 MED ORDER — DEXTROSE 50 % IV SOLN
INTRAVENOUS | Status: AC
  Administered 2016-09-11: 25 mL via INTRAVENOUS
  Filled 2016-09-11: qty 50

## 2016-09-11 MED ORDER — FENTANYL CITRATE (PF) 100 MCG/2ML IJ SOLN
INTRAMUSCULAR | Status: DC | PRN
  Administered 2016-09-11: 50 ug via INTRAVENOUS

## 2016-09-11 MED ORDER — SODIUM CHLORIDE 0.9 % IV SOLN: 250.0000 mL | INTRAVENOUS | Status: DC | PRN

## 2016-09-11 MED ORDER — FENTANYL CITRATE (PF) 100 MCG/2ML IJ SOLN
INTRAMUSCULAR | Status: AC
  Filled 2016-09-11: qty 2

## 2016-09-11 MED ORDER — IOPAMIDOL (ISOVUE-370) INJECTION 76%
INTRAVENOUS | Status: DC | PRN
  Administered 2016-09-11: 80 mL via INTRA_ARTERIAL

## 2016-09-11 MED ORDER — LIDOCAINE HCL (PF) 1 % IJ SOLN
INTRAMUSCULAR | Status: AC
  Filled 2016-09-11: qty 30

## 2016-09-11 MED ORDER — SODIUM CHLORIDE 0.9% FLUSH: 3.0000 mL | Freq: Two times a day (BID) | INTRAVENOUS | Status: DC

## 2016-09-11 MED ORDER — OXYCODONE-ACETAMINOPHEN 5-325 MG PO TABS: 1.0000 | ORAL_TABLET | ORAL | Status: DC | PRN

## 2016-09-11 MED ORDER — MIDAZOLAM HCL 2 MG/2ML IJ SOLN
INTRAMUSCULAR | Status: AC
  Filled 2016-09-11: qty 2

## 2016-09-11 MED ORDER — ACETAMINOPHEN 325 MG PO TABS: 650.0000 mg | ORAL_TABLET | ORAL | Status: DC | PRN

## 2016-09-11 MED ORDER — MIDAZOLAM HCL 2 MG/2ML IJ SOLN
INTRAMUSCULAR | Status: DC | PRN
  Administered 2016-09-11: 1 mg via INTRAVENOUS

## 2016-09-11 MED ORDER — DEXTROSE 50 % IV SOLN
25.0000 mL | Freq: Once | INTRAVENOUS | Status: AC
  Administered 2016-09-11: 25 mL via INTRAVENOUS

## 2016-09-11 MED ORDER — SODIUM CHLORIDE 0.9 % IV SOLN: INTRAVENOUS | Status: DC

## 2016-09-11 MED ORDER — ONDANSETRON HCL 4 MG/2ML IJ SOLN: 4.0000 mg | Freq: Four times a day (QID) | INTRAMUSCULAR | Status: DC | PRN

## 2016-09-11 MED ORDER — IOPAMIDOL (ISOVUE-370) INJECTION 76%
INTRAVENOUS | Status: AC
  Filled 2016-09-11: qty 100

## 2016-09-11 SURGICAL SUPPLY — 13 items
CATH INFINITI 5FR MPB2 (CATHETERS) ×1 IMPLANT
CATH INFINITI JR4 5F (CATHETERS) ×1 IMPLANT
COVER PRB 48X5XTLSCP FOLD TPE (BAG) IMPLANT
COVER PROBE 5X48 (BAG) ×2
DEVICE CLOSURE PERCLS PRGLD 6F (VASCULAR PRODUCTS) IMPLANT
KIT HEART LEFT (KITS) ×2 IMPLANT
PACK CARDIAC CATHETERIZATION (CUSTOM PROCEDURE TRAY) ×2 IMPLANT
PERCLOSE PROGLIDE 6F (VASCULAR PRODUCTS) ×2
SET INTRODUCER MICROPUNCT 5F (INTRODUCER) ×1 IMPLANT
SHEATH PINNACLE 5F 10CM (SHEATH) ×1 IMPLANT
TRANSDUCER W/STOPCOCK (MISCELLANEOUS) ×2 IMPLANT
TUBING CIL FLEX 10 FLL-RA (TUBING) ×2 IMPLANT
WIRE EMERALD 3MM-J .035X150CM (WIRE) ×1 IMPLANT

## 2016-09-11 NOTE — Discharge Instructions (Signed)

## 2016-09-11 NOTE — H&P (View-Only) (Signed)
Electrophysiology Office Note   Date:  09/04/2016   ID:  [name], [name] 1950/06/23, MRN [id number]  PCP:  Geoffery Lyons, MD  Primary Electrophysiologist:  Constance Haw, MD    Chief Complaint  Patient presents with  . Palpitations     History of Present Illness: [name] is a 66 y.o. male who presents today for electrophysiology evaluation.   He has a past history of CK D on dialysis, diabetes, DVT, hyperlipidemia, hypertension, and SVT. He was put on Coumadin for a DVT with filter placed in 2014. It appears that he potentially had a diagnosis of atrial fibrillation in 2016 while on his Coumadin. Recent echo showed severe biventricular dysfunction with an EF of 15-20%. He was in atrial fibrillation at the time of the echo. A Myoview showed a fixed medium size apical inferolateral perfusion defect that could represent infarction without ischemia. He continues to have chest pain when he is being dialyzed. At times he requires oxygen to relieve the chest discomfort. Otherwise he does not complain of shortness of breath.   Today, he denies symptoms of palpitations, shortness of breath, orthopnea, PND, lower extremity edema, claudication, dizziness, presyncope, syncope, bleeding, or neurologic sequela. The patient is tolerating medications without difficulties and is otherwise without complaint today.    Past Medical History:  Diagnosis Date  . Anemia   . Arthritis    HNP- lumbar, "all over my body"  . Blood transfusion    "years ago; blood was low" (08/05/2013)  . CKD (chronic kidney disease) stage 4, GFR 15-29 ml/min (HCC) 03/18/2012   [place]- T,TH,Sat.  . Diabetic nephropathy ([place])   . Diabetic retinopathy   . DVT (deep venous thrombosis) ([place])    "got one in my right leg now; I've had one before too, not sure which leg" (08/05/2013)  . ESRD (end stage renal disease) on dialysis [facility])    "just started today, (08/04/2013)"  . Family history of  anesthesia complication    " my son wakes up slowly"  . GERD (gastroesophageal reflux disease)    uses alka seltzere on occas.   [name])    "one q now and then" (08/05/2013)  . Hyperlipidemia   . Hypertension   . 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 Non Hodgkin's lymphoma ([place])    Tx 2009; "had chemo; it went away" (08/05/2013)  . Noncompliance 03/16/2012  . NSVT (nonsustained ventricular tachycardia) ([place]) 03/18/2012  . Peripheral vascular disease ([place])   . Pneumonia 2013   hosp.-   . Poor historian    pt. unsure of several answers to health history questions   . Skin cancer    melanoma - head  . Sleep apnea    "suppose to have a sleep study, but they never told me when. (08/05/2013)   Past Surgical History:  Procedure Laterality Date  . ACHILLES TENDON SURGERY Right 03/13/2016   Procedure: ACHILLES LENGTHENING/KIDNER;  Surgeon: Edrick Kins, DPM;  Location: [place];  Service: Podiatry;  Laterality: Right;  . AV FISTULA PLACEMENT Left 02/03/2013   Procedure: ARTERIOVENOUS (AV) FISTULA CREATION- LEFT RADIAL CEPHALIC; ULTRASOUND GUIDED;  Surgeon: Mal Misty, MD;  [facility];  Service: Vascular;  Laterality: Left;  . AV FISTULA PLACEMENT Right 11/08/2015   Procedure: RIGHT BRACHIOCEPHALIC ARTERIOVENOUS (AV) FISTULA CREATION;  Surgeon: Serafina Mitchell, MD;  Location: [place];  Service:  Vascular;  Laterality: Right;  . BASCILIC VEIN TRANSPOSITION Right 01/24/2016   Procedure: RIGHT SECOND STAGE BASILIC VEIN TRANSPOSITION;  Surgeon: Angelia Mould, MD;  Location: [place];  Service: Vascular;  Laterality: Right;  . CARDIAC CATHETERIZATION    . COLONOSCOPY N/A 09/23/2015   Procedure: COLONOSCOPY;  Surgeon: Irene Shipper, MD;  Location: [facility];  Service: Endoscopy;  Laterality: N/A;  . Coloscopy    . EYE SURGERY Bilateral   . GAS INSERTION  05/10/2012   Procedure: INSERTION OF GAS;   Surgeon: Hayden Pedro, MD;  Location: [place];  Service: Ophthalmology;  Laterality: Right;  . LESION EXCISION Right 05/08/2015   Procedure: EXCISION SCALP LESION;  Surgeon: Erroll Luna, MD;  Location: [place];  Service: General;  Laterality: Right;  . MEMBRANE PEEL  05/10/2012   Procedure: MEMBRANE PEEL;  Surgeon: Hayden Pedro, MD;  Location: [place];  Service: Ophthalmology;  Laterality: Right;  . PARS PLANA VITRECTOMY  08/27/2011   Procedure: PARS PLANA VITRECTOMY WITH 25 GAUGE;  Surgeon: Hayden Pedro, MD;  Location: [place];  Service: Ophthalmology;  Laterality: Left;  Repair of complex traction retinal detachment left eye  . PARS PLANA VITRECTOMY  05/10/2012   Procedure: PARS PLANA VITRECTOMY WITH 25 GAUGE;  Surgeon: Hayden Pedro, MD;  Location: [place];  Service: Ophthalmology;  Laterality: Right;  Repair Complex Traction Retinal Detachment  . PHOTOCOAGULATION WITH LASER  05/10/2012   Procedure: PHOTOCOAGULATION WITH LASER;  Surgeon: Hayden Pedro, MD;  Location: [place];  Service: Ophthalmology;  Laterality: Right;  . PORT-A-CATH REMOVAL    . PORTACATH PLACEMENT    . TRANSMETATARSAL AMPUTATION Right 03/13/2016   Procedure: TRANSMETATARSAL AMPUTATION;  Surgeon: Edrick Kins, DPM;  Location: [place];  Service: Podiatry;  Laterality: Right;  . VENA CAVA FILTER PLACEMENT  09/2012   due to preparation for surgery     Current Outpatient Prescriptions  Medication Sig Dispense Refill  . acetaminophen (TYLENOL) 325 MG tablet Take 650 mg by mouth every 6 (six) hours as needed (pain).    Marland Kitchen diclofenac sodium (VOLTAREN) 1 % GEL Apply 1 application topically 4 (four) times daily.     Marland Kitchen doxycycline (VIBRA-TABS) 100 MG tablet Take 1 tablet (100 mg total) by mouth every 12 (twelve) hours. 10 tablet 0  . ergocalciferol (VITAMIN D2) 50000 units capsule Take 50,000 Units by mouth once a week.    Marland Kitchen HYDROcodone-acetaminophen (NORCO/VICODIN) 5-325 MG tablet Take 1 tablet by mouth every 6 (six) hours as  needed for moderate pain. 20 tablet 0  . insulin glargine (LANTUS) 100 UNIT/ML injection Inject 0.15 mLs (15 Units total) into the skin at bedtime. 10 mL 11  . lidocaine-prilocaine (EMLA) cream Apply 1 application topically as needed (Apply small amount to access site 1-2 hours before dialysis. Cover with occlusive dressing (saran wrap)).     . metoprolol succinate (TOPROL-XL) 25 MG 24 hr tablet Take 1 tablet (25 mg total) by mouth daily. Take with or immediately following a meal. 90 tablet 3  . multivitamin (RENA-VIT) TABS tablet Take 1 tablet by mouth at bedtime. 30 tablet 0  . oxyCODONE (OXY IR/ROXICODONE) 5 MG immediate release tablet Take 1 tablet (5 mg total) by mouth every 6 (six) hours as needed for moderate pain. 30 tablet 0  . oxyCODONE-acetaminophen (PERCOCET/ROXICET) 5-325 MG tablet Take 1 tablet by mouth every 6 (six) hours as needed (pain).     . polysaccharide iron (NIFEREX) 150 MG CAPS capsule Take 1  capsule (150 mg total) by mouth daily. 30 each 2  . sevelamer carbonate (RENVELA) 800 MG tablet Take 1,600 mg by mouth 3 (three) times daily with meals.    Marland Kitchen sulfamethoxazole-trimethoprim (BACTRIM DS,SEPTRA DS) 800-160 MG tablet Take 1 tablet by mouth 2 (two) times daily. 28 tablet 0  . warfarin (COUMADIN) 5 MG tablet Take as directed      No current facility-administered medications for this visit.     Allergies:   No known allergies   Social History:  The patient  reports that he has never smoked. He has never used smokeless tobacco. He reports that he does not drink alcohol or use drugs.   Family History:  The patient's family history includes Anesthesia problems in his son; Diabetes in his father; Hypertension in his father and son; Other in his father.    ROS:  Please see the history of present illness.   Otherwise, review of systems is positive for A tight change, chest pain, leg swelling, leg pain, diarrhea, back pain, muscle pain, headaches.   All other systems are reviewed  and negative.    PHYSICAL EXAM: VS:  BP (!) 110/50   Pulse (!) 102   Ht 6' 3.5" (1.918 m)   Wt 216 lb 3.2 oz (98.1 kg)   SpO2 99%   BMI 26.67 kg/m  , BMI Body mass index is 26.67 kg/m. GEN: Well nourished, well developed, in no acute distress  HEENT: normal  Neck: no JVD, carotid bruits, or masses Cardiac: tachycardic, regular; no murmurs, rubs, or gallops,no edema  Respiratory:  clear to auscultation bilaterally, normal work of breathing GI: soft, nontender, nondistended, + BS MS: no deformity or atrophy  Skin: warm and dry,  Neuro:  Strength and sensation are intact Psych: euthymic mood, full affect  EKG:  EKG is ordered today. Personal review of the ekg ordered shows wide complex tachycardia, possible atrial flutter vs atrial tachycardia, RBBB, LAFB  Recent Labs: 03/06/2016: ALT 14 03/16/2016: BUN 32; Creatinine, Ser 6.16; Potassium 4.3; Sodium 134 03/17/2016: Hemoglobin 9.2; Platelets 340    Lipid Panel     Component Value Date/Time   CHOL 129 01/14/2011 0437   TRIG 137 01/14/2011 0437   HDL 13 (L) 01/14/2011 0437   CHOLHDL 9.9 01/14/2011 0437   VLDL 27 01/14/2011 0437   LDLCALC 89 01/14/2011 0437     Wt Readings from Last 3 Encounters:  09/04/16 216 lb 3.2 oz (98.1 kg)  08/05/16 223 lb (101.2 kg)  07/22/16 219 lb 9.6 oz (99.6 kg)      Other studies Reviewed: Additional studies/ records that were reviewed today include: Myoview 08/14/16 Review of the above records today demonstrates:    Nuclear stress EF: 29%.  There was no ST segment deviation noted during stress.  This is a high risk study.  The left ventricular ejection fraction is severely decreased (<30%).   1. EF 29%, diffuse hypokinesis.  2. Fixed, medium-sized, mild-intensity basal to apical inferolateral perfusion defect.  This could represent prior myocardial infarction, no ischemia.  3. High risk study primarily due to low EF.   TTE 08/05/16 - Left ventricle: The cavity size was mildly  dilated. There was   mild concentric hypertrophy. Systolic function was severely   reduced. The estimated ejection fraction was in the range of   15-20%. Diffuse hypokinesis. The study was not technically   sufficient to allow evaluation of LV diastolic dysfunction due to   atrial fibrillation. - Aortic valve: Trileaflet; normal thickness  leaflets. There was no   regurgitation. - Aortic root: The aortic root was normal in size. - Mitral valve: There was mild regurgitation. - Left atrium: The atrium was severely dilated. - Right ventricle: The cavity size was mildly dilated. Wall   thickness was normal. Systolic function was severely reduced. - Right atrium: The atrium was moderately dilated. - Tricuspid valve: There was moderate regurgitation. - Pulmonary arteries: Systolic pressure was mildly increased. PA   peak pressure: 45 mm Hg (S). - Inferior vena cava: The vessel was dilated. The respirophasic   diameter changes were blunted (< 50%), consistent with elevated   central venous pressure. - Pericardium, extracardiac: There was no pericardial effusion.  ASSESSMENT AND PLAN:  1.  Atrial fibrillation: Likely persistent. Currently on Coumadin. We'll have to stop the Coumadin is planning a left heart catheterization.  This patients CHA2DS2-VASc Score and unadjusted Ischemic Stroke Rate (% per year) is equal to 3.2 % stroke rate/year from a score of 3  Above score calculated as 1 point each if present [CHF, HTN, DM, Vascular=MI/PAD/Aortic Plaque, Age if 65-74, or Male] Above score calculated as 2 points each if present [Age > 75, or Stroke/TIA/TE]   2. Hypertension: Well-controlled today. Managed by primary care.  3. Chest pain: Myoview shows a low ejection fraction with evidence of a prior infarction. He is continuing to have chest pain with dialysis. We'll plan for left heart catheterization to further define his coronary anatomy. Risks and benefits were discussed. Risks include  bleeding, tamponade, heart attack, and stroke. He understands these risks and has agreed to the procedure.  4. Chronic systolic heart failure: Ejection fraction is significantly decreased with biventricular dysfunction. He is currently on metoprolol. It is possible that this is an ischemic cardiomyopathy also possible that his tachycardia-related. Unfortunately, he is on dialysis and and her is unlikely to tolerate further titration of his medications. Maybe start BiDil and hydralazine in the future.   Current medicines are reviewed at length with the patient today.   The patient does not have concerns regarding his medicines.  The following changes were made today:  none  Labs/ tests ordered today include:  Orders Placed This Encounter  Procedures  . EKG 12-Lead     Disposition:   FU with Drianna Chandran 1.5 months  Signed, Merial Moritz [name], MD  09/04/2016 3:35 PM     [facility] [address] [place] [address] [phone] (office) [phone] (fax)

## 2016-09-11 NOTE — Interval H&P Note (Signed)
Cath Lab Visit (complete for each Cath Lab visit)  Clinical Evaluation Leading to the Procedure:   ACS: No.  Non-ACS:    Anginal Classification: CCS III  Anti-ischemic medical therapy: Minimal Therapy (1 class of medications)  Non-Invasive Test Results: Intermediate-risk stress test findings: cardiac mortality 1-3%/year  Prior CABG: No previous CABG      History and Physical Interval Note:  09/11/2016 8:46 AM  [name]  has presented today for surgery, with the diagnosis of cp, abnormal myoview  The various methods of treatment have been discussed with the patient and family. After consideration of risks, benefits and other options for treatment, the patient has consented to  Procedure(s): Left Heart Cath and Coronary Angiography (N/A) as a surgical intervention .  The patient's history has been reviewed, patient examined, no change in status, stable for surgery.  I have reviewed the patient's chart and labs.  Questions were answered to the patient's satisfaction.     Belva Crome III

## 2016-09-12 DIAGNOSIS — D631 Anemia in chronic kidney disease: Secondary | ICD-10-CM | POA: Diagnosis not present

## 2016-09-12 DIAGNOSIS — N2581 Secondary hyperparathyroidism of renal origin: Secondary | ICD-10-CM | POA: Diagnosis not present

## 2016-09-12 DIAGNOSIS — E1129 Type 2 diabetes mellitus with other diabetic kidney complication: Secondary | ICD-10-CM | POA: Diagnosis not present

## 2016-09-12 DIAGNOSIS — N186 End stage renal disease: Secondary | ICD-10-CM | POA: Diagnosis not present

## 2016-09-14 ENCOUNTER — Ambulatory Visit (INDEPENDENT_AMBULATORY_CARE_PROVIDER_SITE_OTHER): Payer: Medicare Other | Admitting: Ophthalmology

## 2016-09-15 DIAGNOSIS — N2581 Secondary hyperparathyroidism of renal origin: Secondary | ICD-10-CM | POA: Diagnosis not present

## 2016-09-15 DIAGNOSIS — N186 End stage renal disease: Secondary | ICD-10-CM | POA: Diagnosis not present

## 2016-09-15 DIAGNOSIS — D631 Anemia in chronic kidney disease: Secondary | ICD-10-CM | POA: Diagnosis not present

## 2016-09-15 DIAGNOSIS — E1129 Type 2 diabetes mellitus with other diabetic kidney complication: Secondary | ICD-10-CM | POA: Diagnosis not present

## 2016-09-17 DIAGNOSIS — E1129 Type 2 diabetes mellitus with other diabetic kidney complication: Secondary | ICD-10-CM | POA: Diagnosis not present

## 2016-09-17 DIAGNOSIS — D631 Anemia in chronic kidney disease: Secondary | ICD-10-CM | POA: Diagnosis not present

## 2016-09-17 DIAGNOSIS — N2581 Secondary hyperparathyroidism of renal origin: Secondary | ICD-10-CM | POA: Diagnosis not present

## 2016-09-17 DIAGNOSIS — N186 End stage renal disease: Secondary | ICD-10-CM | POA: Diagnosis not present

## 2016-09-19 DIAGNOSIS — D631 Anemia in chronic kidney disease: Secondary | ICD-10-CM | POA: Diagnosis not present

## 2016-09-19 DIAGNOSIS — N186 End stage renal disease: Secondary | ICD-10-CM | POA: Diagnosis not present

## 2016-09-19 DIAGNOSIS — N2581 Secondary hyperparathyroidism of renal origin: Secondary | ICD-10-CM | POA: Diagnosis not present

## 2016-09-19 DIAGNOSIS — E1129 Type 2 diabetes mellitus with other diabetic kidney complication: Secondary | ICD-10-CM | POA: Diagnosis not present

## 2016-09-22 DIAGNOSIS — D631 Anemia in chronic kidney disease: Secondary | ICD-10-CM | POA: Diagnosis not present

## 2016-09-22 DIAGNOSIS — N186 End stage renal disease: Secondary | ICD-10-CM | POA: Diagnosis not present

## 2016-09-22 DIAGNOSIS — N2581 Secondary hyperparathyroidism of renal origin: Secondary | ICD-10-CM | POA: Diagnosis not present

## 2016-09-22 DIAGNOSIS — E1129 Type 2 diabetes mellitus with other diabetic kidney complication: Secondary | ICD-10-CM | POA: Diagnosis not present

## 2016-09-23 DIAGNOSIS — Z7901 Long term (current) use of anticoagulants: Secondary | ICD-10-CM | POA: Diagnosis not present

## 2016-09-23 DIAGNOSIS — I4891 Unspecified atrial fibrillation: Secondary | ICD-10-CM | POA: Diagnosis not present

## 2016-09-23 DIAGNOSIS — I80202 Phlebitis and thrombophlebitis of unspecified deep vessels of left lower extremity: Secondary | ICD-10-CM | POA: Diagnosis not present

## 2016-09-24 DIAGNOSIS — D631 Anemia in chronic kidney disease: Secondary | ICD-10-CM | POA: Diagnosis not present

## 2016-09-24 DIAGNOSIS — N186 End stage renal disease: Secondary | ICD-10-CM | POA: Diagnosis not present

## 2016-09-24 DIAGNOSIS — N2581 Secondary hyperparathyroidism of renal origin: Secondary | ICD-10-CM | POA: Diagnosis not present

## 2016-09-24 DIAGNOSIS — E1129 Type 2 diabetes mellitus with other diabetic kidney complication: Secondary | ICD-10-CM | POA: Diagnosis not present

## 2016-09-26 DIAGNOSIS — D631 Anemia in chronic kidney disease: Secondary | ICD-10-CM | POA: Diagnosis not present

## 2016-09-26 DIAGNOSIS — N186 End stage renal disease: Secondary | ICD-10-CM | POA: Diagnosis not present

## 2016-09-26 DIAGNOSIS — E1129 Type 2 diabetes mellitus with other diabetic kidney complication: Secondary | ICD-10-CM | POA: Diagnosis not present

## 2016-09-26 DIAGNOSIS — N2581 Secondary hyperparathyroidism of renal origin: Secondary | ICD-10-CM | POA: Diagnosis not present

## 2016-09-28 DIAGNOSIS — N186 End stage renal disease: Secondary | ICD-10-CM | POA: Diagnosis not present

## 2016-09-28 DIAGNOSIS — E1129 Type 2 diabetes mellitus with other diabetic kidney complication: Secondary | ICD-10-CM | POA: Diagnosis not present

## 2016-09-28 DIAGNOSIS — Z992 Dependence on renal dialysis: Secondary | ICD-10-CM | POA: Diagnosis not present

## 2016-09-29 DIAGNOSIS — D631 Anemia in chronic kidney disease: Secondary | ICD-10-CM | POA: Diagnosis not present

## 2016-09-29 DIAGNOSIS — N186 End stage renal disease: Secondary | ICD-10-CM | POA: Diagnosis not present

## 2016-09-29 DIAGNOSIS — E1129 Type 2 diabetes mellitus with other diabetic kidney complication: Secondary | ICD-10-CM | POA: Diagnosis not present

## 2016-09-29 DIAGNOSIS — N2581 Secondary hyperparathyroidism of renal origin: Secondary | ICD-10-CM | POA: Diagnosis not present

## 2016-10-01 DIAGNOSIS — N186 End stage renal disease: Secondary | ICD-10-CM | POA: Diagnosis not present

## 2016-10-01 DIAGNOSIS — E1129 Type 2 diabetes mellitus with other diabetic kidney complication: Secondary | ICD-10-CM | POA: Diagnosis not present

## 2016-10-01 DIAGNOSIS — D631 Anemia in chronic kidney disease: Secondary | ICD-10-CM | POA: Diagnosis not present

## 2016-10-01 DIAGNOSIS — N2581 Secondary hyperparathyroidism of renal origin: Secondary | ICD-10-CM | POA: Diagnosis not present

## 2016-10-03 DIAGNOSIS — N2581 Secondary hyperparathyroidism of renal origin: Secondary | ICD-10-CM | POA: Diagnosis not present

## 2016-10-03 DIAGNOSIS — N186 End stage renal disease: Secondary | ICD-10-CM | POA: Diagnosis not present

## 2016-10-03 DIAGNOSIS — E1129 Type 2 diabetes mellitus with other diabetic kidney complication: Secondary | ICD-10-CM | POA: Diagnosis not present

## 2016-10-03 DIAGNOSIS — D631 Anemia in chronic kidney disease: Secondary | ICD-10-CM | POA: Diagnosis not present

## 2016-10-06 DIAGNOSIS — E1129 Type 2 diabetes mellitus with other diabetic kidney complication: Secondary | ICD-10-CM | POA: Diagnosis not present

## 2016-10-06 DIAGNOSIS — N2581 Secondary hyperparathyroidism of renal origin: Secondary | ICD-10-CM | POA: Diagnosis not present

## 2016-10-06 DIAGNOSIS — N186 End stage renal disease: Secondary | ICD-10-CM | POA: Diagnosis not present

## 2016-10-06 DIAGNOSIS — D631 Anemia in chronic kidney disease: Secondary | ICD-10-CM | POA: Diagnosis not present

## 2016-10-08 DIAGNOSIS — D631 Anemia in chronic kidney disease: Secondary | ICD-10-CM | POA: Diagnosis not present

## 2016-10-08 DIAGNOSIS — E1129 Type 2 diabetes mellitus with other diabetic kidney complication: Secondary | ICD-10-CM | POA: Diagnosis not present

## 2016-10-08 DIAGNOSIS — N186 End stage renal disease: Secondary | ICD-10-CM | POA: Diagnosis not present

## 2016-10-08 DIAGNOSIS — N2581 Secondary hyperparathyroidism of renal origin: Secondary | ICD-10-CM | POA: Diagnosis not present

## 2016-10-10 DIAGNOSIS — E1129 Type 2 diabetes mellitus with other diabetic kidney complication: Secondary | ICD-10-CM | POA: Diagnosis not present

## 2016-10-10 DIAGNOSIS — N186 End stage renal disease: Secondary | ICD-10-CM | POA: Diagnosis not present

## 2016-10-10 DIAGNOSIS — D631 Anemia in chronic kidney disease: Secondary | ICD-10-CM | POA: Diagnosis not present

## 2016-10-10 DIAGNOSIS — N2581 Secondary hyperparathyroidism of renal origin: Secondary | ICD-10-CM | POA: Diagnosis not present

## 2016-10-13 DIAGNOSIS — N2581 Secondary hyperparathyroidism of renal origin: Secondary | ICD-10-CM | POA: Diagnosis not present

## 2016-10-13 DIAGNOSIS — N186 End stage renal disease: Secondary | ICD-10-CM | POA: Diagnosis not present

## 2016-10-13 DIAGNOSIS — E1129 Type 2 diabetes mellitus with other diabetic kidney complication: Secondary | ICD-10-CM | POA: Diagnosis not present

## 2016-10-13 DIAGNOSIS — D631 Anemia in chronic kidney disease: Secondary | ICD-10-CM | POA: Diagnosis not present

## 2016-10-14 ENCOUNTER — Encounter: Payer: Self-pay | Admitting: Cardiology

## 2016-10-14 ENCOUNTER — Ambulatory Visit (INDEPENDENT_AMBULATORY_CARE_PROVIDER_SITE_OTHER): Payer: Medicare Other | Admitting: Cardiology

## 2016-10-14 ENCOUNTER — Encounter (INDEPENDENT_AMBULATORY_CARE_PROVIDER_SITE_OTHER): Payer: Self-pay

## 2016-10-14 VITALS — BP 118/62 | HR 78 | Ht 75.5 in | Wt 219.6 lb

## 2016-10-14 DIAGNOSIS — I480 Paroxysmal atrial fibrillation: Secondary | ICD-10-CM

## 2016-10-14 DIAGNOSIS — I10 Essential (primary) hypertension: Secondary | ICD-10-CM | POA: Diagnosis not present

## 2016-10-14 DIAGNOSIS — I428 Other cardiomyopathies: Secondary | ICD-10-CM | POA: Diagnosis not present

## 2016-10-14 DIAGNOSIS — I70235 Atherosclerosis of native arteries of right leg with ulceration of other part of foot: Secondary | ICD-10-CM

## 2016-10-14 MED ORDER — ISOSORB DINITRATE-HYDRALAZINE 20-37.5 MG PO TABS: 1.0000 | ORAL_TABLET | Freq: Three times a day (TID) | ORAL | 6 refills | Status: DC

## 2016-10-14 NOTE — Patient Instructions (Addendum)
Medication Instructions:    Your physician has recommended you make the following change in your medication: 1) START BiDil 20/37.5 mg three times a day  Labwork:  None ordered  Testing/Procedures:  None ordered  Follow-Up:  Your physician wants you to follow-up in: 6 months with Dr. Curt Bears.  You will receive a reminder letter in the mail two months in advance. If you don't receive a letter, please call our office to schedule the follow-up appointment.  - If you need a refill on your cardiac medications before your next appointment, please call your pharmacy.   Thank you for choosing [facility]!!   Trinidad Curet, RN [phone]   Any Other Special Instructions Will Be Listed Below (If Applicable).  Hydralazine; Isosorbide Dinitrate, ISDN tablet What is this medicine? HYDRALAZINE; ISOSORBIDE DINITRATE (hye DRAL a zeen; eye soe SOR bide dye NYE trate) is a combination of two vasodilator drugs. It is given in combination with other medicines to treat heart failure. This medicine may be used for other purposes; ask your health care provider or pharmacist if you have questions. COMMON BRAND NAME(S): BiDil What should I tell my health care provider before I take this medicine? They need to know if you have any of these conditions: -blood vessel disease -heart disease including angina or history of heart attack -kidney disease -liver disease -previous heart attack or heart failure -systemic lupus erythematosus (SLE) -an unusual or allergic reaction to hydralazine, isosorbide dinitrate, other medicines, foods, dyes, or preservatives -pregnant or trying to get pregnant -breast-feeding How should I use this medicine? Take this medicine by mouth with a glass of water. Follow the directions on the prescription label. Take your medicine at regular intervals. Do not take your medicine more often than directed. Do not stop taking except on the advice of your doctor or health care  professional. Talk to your pediatrician regarding the use of this medicine in children. Special care may be needed. Overdosage: If you think you have taken too much of this medicine contact a poison control center or emergency room at once. NOTE: This medicine is only for you. Do not share this medicine with others. What if I miss a dose? If you miss a dose, take it as soon as you can. If it is almost time for your next dose, take only that dose. Do not take double or extra doses. What may interact with this medicine? Do not take this medicine with any of the following medications: -avanafil -riociguat -sildenafil -tadalafil -vardenafil This medicine may also interact with the following medications: -medicines for high blood pressure -medicines for mental depression -other medicines for angina or heart failure This list may not describe all possible interactions. Give your health care provider a list of all the medicines, herbs, non-prescription drugs, or dietary supplements you use. Also tell them if you smoke, drink alcohol, or use illegal drugs. Some items may interact with your medicine. What should I watch for while using this medicine? Check your heart rate and blood pressure regularly while you are taking this medicine. Ask your doctor or health care professional what your heart rate and blood pressure should be and when you should contact him or her. Tell your doctor or health care professional if you feel your medicine is no longer working. You may get dizzy. Do not drive, use machinery, or do anything that needs mental alertness until you know how this medicine affects you. To reduce the risk of dizzy or fainting spells, do not  sit or stand up quickly, especially if you are an older patient. Alcohol can make you more dizzy, and increase flushing and rapid heartbeats. Avoid alcoholic drinks. Do not treat yourself for coughs, colds, or pain while you are taking this medicine without  asking your doctor or health care professional for advice. Some ingredients may increase your blood pressure. What side effects may I notice from receiving this medicine? Side effects that you should report to your doctor or health care professional as soon as possible: -allergic reactions like skin rash, itching or hives, swelling of the face, lips, or tongue -bluish discoloration of lips, fingernails, or palms of hands -chest pain, or fast or irregular heartbeat -fever, chills, or sore throat -low blood pressure -nausea, vomiting -numbness or tingling in the hands or feet -persistent headache -shortness of breath -stiff or swollen joints -sudden weight gain -swelling of the feet or legs -swollen lymph glands -unusual weakness Side effects that usually do not require medical attention (report to your doctor or health care professional if they continue or are bothersome): -diarrhea, or constipation -flushing of the face or neck -headache -loss of appetite This list may not describe all possible side effects. Call your doctor for medical advice about side effects. You may report side effects to FDA at 1-800-FDA-1088. Where should I keep my medicine? Keep out of the reach of children. Store at room temperature between 15 and 30 degrees C (59 and 86 degrees F). Protect from light. Keep container tightly closed. Throw away any unused medicine after the expiration date. NOTE: This sheet is a summary. It may not cover all possible information. If you have questions about this medicine, talk to your doctor, pharmacist, or health care provider.  2018 Elsevier/Gold Standard (2013-03-17 14:44:34)

## 2016-10-14 NOTE — Progress Notes (Signed)
Electrophysiology Office Note   Date:  10/14/2016   ID:  [name], [name] Jun 06, 1950, MRN [id number]  PCP:  Burnard Bunting, MD  Primary Electrophysiologist:  Hipolito Martinezlopez [name], MD    Chief Complaint  Patient presents with  . Follow-up    PAF     History of Present Illness: [name] is a 66 y.o. male who presents today for electrophysiology evaluation.   He has a past history of CK D on dialysis, diabetes, DVT, hyperlipidemia, hypertension, and SVT. He was put on Coumadin for a DVT with filter placed in 2014. It appears that he potentially had a diagnosis of atrial fibrillation in 2016 while on his Coumadin. Recent echo showed severe biventricular dysfunction with an EF of 15-20%. He was in atrial fibrillation at the time of the echo. A Myoview showed a fixed medium size apical inferolateral perfusion defect that could represent infarction without ischemia. Had cardiac cath which showed patient coronary arteries. Since that time, he says that his chest pain has improved. He no longer has to be on oxygen when he is getting dialyzed. He does say that his blood pressure at times drops during dialysis, but usually has been stable. He gets his INRs checked at [place] by his primary physician. He says that he does not have much in the way of shortness of breath or fatigue. He is tolerating his medications without issue. He has no other complaints.    Past Medical History:  Diagnosis Date  . Anemia   . Arthritis    HNP- lumbar, "all over my body"  . Blood transfusion    "years ago; blood was low" (08/05/2013)  . CKD (chronic kidney disease) stage 4, GFR 15-29 ml/min (HCC) 03/18/2012   [place]- T,TH,Sat.  . Diabetic nephropathy ([place])   . Diabetic retinopathy   . DVT (deep venous thrombosis) ([place])    "got one in my right leg now; I've had one before too, not sure which leg" (08/05/2013)  . ESRD (end stage renal disease) on dialysis [facility])    "just started  today, (08/04/2013)"  . Family history of anesthesia complication    " my son wakes up slowly"  . GERD (gastroesophageal reflux disease)    uses alka seltzere on occas.   [name])    "one q now and then" (08/05/2013)  . Hyperlipidemia   . Hypertension   . 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 Nodular lymphoma of intra-abdominal lymph nodes ([place])   . Non Hodgkin's lymphoma ([place])    Tx 2009; "had chemo; it went away" (08/05/2013)  . Noncompliance 03/16/2012  . NSVT (nonsustained ventricular tachycardia) ([place]) 03/18/2012  . Peripheral vascular disease ([place])   . Pneumonia 2013   hosp.-   . Poor historian    pt. unsure of several answers to health history questions   . Skin cancer    melanoma - head  . Sleep apnea    "suppose to have a sleep study, but they never told me when. (08/05/2013)   Past Surgical History:  Procedure Laterality Date  . ACHILLES TENDON SURGERY Right 03/13/2016   Procedure: ACHILLES LENGTHENING/KIDNER;  Surgeon: Edrick Kins, DPM;  Location: [place];  Service: Podiatry;  Laterality: Right;  . AV FISTULA PLACEMENT Left 02/03/2013   Procedure: ARTERIOVENOUS (AV) FISTULA CREATION- LEFT RADIAL CEPHALIC; ULTRASOUND GUIDED;  Surgeon: Mal Misty, MD;  Location: [place];  Service: Vascular;  Laterality: Left;  .  AV FISTULA PLACEMENT Right 11/08/2015   Procedure: RIGHT BRACHIOCEPHALIC ARTERIOVENOUS (AV) FISTULA CREATION;  Surgeon: Serafina Mitchell, MD;  Location: [place];  Service: Vascular;  Laterality: Right;  . BASCILIC VEIN TRANSPOSITION Right 01/24/2016   Procedure: RIGHT SECOND STAGE BASILIC VEIN TRANSPOSITION;  Surgeon: Angelia Mould, MD;  Location: [place];  Service: Vascular;  Laterality: Right;  . CARDIAC CATHETERIZATION    . COLONOSCOPY N/A 09/23/2015   Procedure: COLONOSCOPY;  Surgeon: Irene Shipper, MD;  Location: [facility];  Service: Endoscopy;  Laterality: N/A;  . Coloscopy    . EYE SURGERY Bilateral   . GAS INSERTION   05/10/2012   Procedure: INSERTION OF GAS;  Surgeon: Hayden Pedro, MD;  Location: [place];  Service: Ophthalmology;  Laterality: Right;  . LEFT HEART CATH AND CORONARY ANGIOGRAPHY N/A 09/11/2016   Procedure: Left Heart Cath and Coronary Angiography;  Surgeon: Belva Crome, MD;  Location: [place] [facility];  Service: Cardiovascular;  Laterality: N/A;  . LESION EXCISION Right 05/08/2015   Procedure: EXCISION SCALP LESION;  Surgeon: Erroll Luna, MD;  Location: [place];  Service: General;  Laterality: Right;  . MEMBRANE PEEL  05/10/2012   Procedure: MEMBRANE PEEL;  Surgeon: Hayden Pedro, MD;  Location: [place];  Service: Ophthalmology;  Laterality: Right;  . PARS PLANA VITRECTOMY  08/27/2011   Procedure: PARS PLANA VITRECTOMY WITH 25 GAUGE;  Surgeon: Hayden Pedro, MD;  Location: [place];  Service: Ophthalmology;  Laterality: Left;  Repair of complex traction retinal detachment left eye  . PARS PLANA VITRECTOMY  05/10/2012   Procedure: PARS PLANA VITRECTOMY WITH 25 GAUGE;  Surgeon: Hayden Pedro, MD;  Location: [place];  Service: Ophthalmology;  Laterality: Right;  Repair Complex Traction Retinal Detachment  . PHOTOCOAGULATION WITH LASER  05/10/2012   Procedure: PHOTOCOAGULATION WITH LASER;  Surgeon: Hayden Pedro, MD;  Location: [place];  Service: Ophthalmology;  Laterality: Right;  . PORT-A-CATH REMOVAL    . PORTACATH PLACEMENT    . TRANSMETATARSAL AMPUTATION Right 03/13/2016   Procedure: TRANSMETATARSAL AMPUTATION;  Surgeon: Edrick Kins, DPM;  Location: [place];  Service: Podiatry;  Laterality: Right;  . VENA CAVA FILTER PLACEMENT  09/2012   due to preparation for surgery     Current Outpatient Prescriptions  Medication Sig Dispense Refill  . acetaminophen (TYLENOL) 325 MG tablet Take 650 mg by mouth every 6 (six) hours as needed (pain).    Marland Kitchen ergocalciferol (VITAMIN D2) 50000 units capsule Take 50,000 Units by mouth once a week. Monday    . HYDROcodone-acetaminophen (NORCO/VICODIN) 5-325 MG  tablet Take 1 tablet by mouth every 6 (six) hours as needed for moderate pain. 20 tablet 0  . insulin aspart (NOVOLOG) 100 UNIT/ML injection Inject 10 Units into the skin 3 (three) times daily before meals.     . insulin glargine (LANTUS) 100 UNIT/ML injection Inject 0.15 mLs (15 Units total) into the skin at bedtime. 10 mL 11  . lidocaine-prilocaine (EMLA) cream Apply 1 application topically as needed (Apply small amount to access site 1-2 hours before dialysis. Cover with occlusive dressing (saran wrap)).     . metoprolol succinate (TOPROL-XL) 25 MG 24 hr tablet Take 1 tablet (25 mg total) by mouth daily. Take with or immediately following a meal. 90 tablet 3  . multivitamin (RENA-VIT) TABS tablet Take 1 tablet by mouth at bedtime. 30 tablet 0  . polysaccharide iron (NIFEREX) 150 MG CAPS capsule Take 1 capsule (150 mg total) by mouth  daily. 30 each 2  . sevelamer carbonate (RENVELA) 800 MG tablet Take 1,600 mg by mouth 3 (three) times daily with meals.    . warfarin (COUMADIN) 5 MG tablet Take 7.5-10 mg by mouth daily. Take 7.5 mg on Sun / Tues / Wed / Fri / Sat  Take 10 mg on Mon / Thurs    . isosorbide-hydrALAZINE (BIDIL) 20-37.5 MG tablet Take 1 tablet by mouth 3 (three) times daily. 90 tablet 6   No current facility-administered medications for this visit.     Allergies:   Patient has no known allergies.   Social History:  The patient  reports that he has never smoked. He has never used smokeless tobacco. He reports that he does not drink alcohol or use drugs.   Family History:  The patient's family history includes Anesthesia problems in his son; Diabetes in his father; Hypertension in his father and son; Other in his father.    ROS:  Please see the history of present illness.   Otherwise, review of systems is positive for none.   All other systems are reviewed and negative.    PHYSICAL EXAM: VS:  BP 118/62   Pulse 78   Ht 6' 3.5" (1.918 m)   Wt 219 lb 9.6 oz (99.6 kg)   BMI  27.09 kg/m  , BMI Body mass index is 27.09 kg/m. GEN: Well nourished, well developed, in no acute distress  HEENT: normal  Neck: no JVD, carotid bruits, or masses Cardiac: RRR; no murmurs, rubs, or gallops,no edema  Respiratory:  clear to auscultation bilaterally, normal work of breathing GI: soft, nontender, nondistended, + BS MS: no deformity or atrophy  Skin: warm and dry Neuro:  Strength and sensation are intact Psych: euthymic mood, full affect   EKG:  EKG is ordered today. Personal review of the ekg ordered shows sinus rhythm, first-degree AV block, right bundle branch block, left anterior fascicular block, LVH with repolarization abnormalities  Recent Labs: 03/06/2016: ALT 14 03/17/2016: Hemoglobin 9.2 09/04/2016: BUN 22; Creatinine, Ser 4.96; Platelets 153; Potassium 4.7; Sodium 142    Lipid Panel     Component Value Date/Time   CHOL 129 01/14/2011 0437   TRIG 137 01/14/2011 0437   HDL 13 (L) 01/14/2011 0437   CHOLHDL 9.9 01/14/2011 0437   VLDL 27 01/14/2011 0437   LDLCALC 89 01/14/2011 0437     Wt Readings from Last 3 Encounters:  10/14/16 219 lb 9.6 oz (99.6 kg)  09/11/16 215 lb (97.5 kg)  09/04/16 216 lb 3.2 oz (98.1 kg)      Other studies Reviewed: Additional studies/ records that were reviewed today include: Myoview 08/14/16 Review of the above records today demonstrates:    Nuclear stress EF: 29%.  There was no ST segment deviation noted during stress.  This is a high risk study.  The left ventricular ejection fraction is severely decreased (<30%).   1. EF 29%, diffuse hypokinesis.  2. Fixed, medium-sized, mild-intensity basal to apical inferolateral perfusion defect.  This could represent prior myocardial infarction, no ischemia.  3. High risk study primarily due to low EF.   TTE 08/05/16 - Left ventricle: The cavity size was mildly dilated. There was   mild concentric hypertrophy. Systolic function was severely   reduced. The estimated ejection  fraction was in the range of   15-20%. Diffuse hypokinesis. The study was not technically   sufficient to allow evaluation of LV diastolic dysfunction due to   atrial fibrillation. - Aortic valve: Trileaflet;  normal thickness leaflets. There was no   regurgitation. - Aortic root: The aortic root was normal in size. - Mitral valve: There was mild regurgitation. - Left atrium: The atrium was severely dilated. - Right ventricle: The cavity size was mildly dilated. Wall   thickness was normal. Systolic function was severely reduced. - Right atrium: The atrium was moderately dilated. - Tricuspid valve: There was moderate regurgitation. - Pulmonary arteries: Systolic pressure was mildly increased. PA   peak pressure: 45 mm Hg (S). - Inferior vena cava: The vessel was dilated. The respirophasic   diameter changes were blunted (< 50%), consistent with elevated   central venous pressure. - Pericardium, extracardiac: There was no pericardial effusion.  Cardiac cath 09/11/16  Widely patent coronary arteries with luminal irregularities in the LAD and right coronary.  Severe left ventricular systolic dysfunction with global wall motion abnormality and an EF less than 25%.  Hemostasis achieved with Perclose in the right femoral.  ASSESSMENT AND PLAN:  1.  Atrial fibrillation: Currently is on Coumadin. INRs checked by primary physician. We Abdoulie Tierce call his primary physician ago from medical to get his most recent INRs to make sure that it is safe to put him on antiarrhythmics. He is in sinus rhythm today.  This patients CHA2DS2-VASc Score and unadjusted Ischemic Stroke Rate (% per year) is equal to 3.2 % stroke rate/year from a score of 3  Above score calculated as 1 point each if present [CHF, HTN, DM, Vascular=MI/PAD/Aortic Plaque, Age if 65-74, or Male] Above score calculated as 2 points each if present [Age > 75, or Stroke/TIA/TE]   2. Hypertension: Well-controlled today. No medication  changes.  3. Chest pain: Recent cath shows no evidence of coronary disease that would be causing his chest pain. Chest pain is improved since catheterization. No changes necessary at this time.  4. Chronic systolic heart failure due to nonischemic cardiomyopathy: Ejection fraction significantly decreased both on echo as well as LV gram during catheterization which showed no coronary abnormalities. He is currently not on optimal medical therapy only on metoprolol. We'll start him on low-dose BiDil today.   Current medicines are reviewed at length with the patient today.   The patient does not have concerns regarding his medicines.  The following changes were made today:  Bidil  Labs/ tests ordered today include:  No orders of the defined types were placed in this encounter.    Disposition:   FU with Marielouise Amey 6 months  Signed, Kapri Nero [name], MD  10/14/2016 10:02 AM     [facility] [address] [place] [address] [phone] (office) [phone] (fax)

## 2016-10-15 DIAGNOSIS — D631 Anemia in chronic kidney disease: Secondary | ICD-10-CM | POA: Diagnosis not present

## 2016-10-15 DIAGNOSIS — N186 End stage renal disease: Secondary | ICD-10-CM | POA: Diagnosis not present

## 2016-10-15 DIAGNOSIS — E1129 Type 2 diabetes mellitus with other diabetic kidney complication: Secondary | ICD-10-CM | POA: Diagnosis not present

## 2016-10-15 DIAGNOSIS — N2581 Secondary hyperparathyroidism of renal origin: Secondary | ICD-10-CM | POA: Diagnosis not present

## 2016-10-16 DIAGNOSIS — Z992 Dependence on renal dialysis: Secondary | ICD-10-CM | POA: Diagnosis not present

## 2016-10-16 DIAGNOSIS — N186 End stage renal disease: Secondary | ICD-10-CM | POA: Diagnosis not present

## 2016-10-16 DIAGNOSIS — T82858A Stenosis of vascular prosthetic devices, implants and grafts, initial encounter: Secondary | ICD-10-CM | POA: Diagnosis not present

## 2016-10-16 DIAGNOSIS — I871 Compression of vein: Secondary | ICD-10-CM | POA: Diagnosis not present

## 2016-10-17 DIAGNOSIS — N2581 Secondary hyperparathyroidism of renal origin: Secondary | ICD-10-CM | POA: Diagnosis not present

## 2016-10-17 DIAGNOSIS — D631 Anemia in chronic kidney disease: Secondary | ICD-10-CM | POA: Diagnosis not present

## 2016-10-17 DIAGNOSIS — N186 End stage renal disease: Secondary | ICD-10-CM | POA: Diagnosis not present

## 2016-10-17 DIAGNOSIS — E1129 Type 2 diabetes mellitus with other diabetic kidney complication: Secondary | ICD-10-CM | POA: Diagnosis not present

## 2016-10-19 ENCOUNTER — Ambulatory Visit: Payer: Medicare Other | Admitting: Cardiology

## 2016-10-19 ENCOUNTER — Telehealth: Payer: Self-pay | Admitting: Cardiology

## 2016-10-19 NOTE — Addendum Note (Signed)
Addended by: Stanton Kidney on: 10/19/2016 01:25 PM   Modules accepted: Orders

## 2016-10-19 NOTE — Telephone Encounter (Signed)
Pt reports BP readings today: 12:00 = 112/64   2:00 = 131/80 Pt will continue to monitor and follow up on Wednesday to see if there is consistency with BP in 110s and dizziness. He is going to continue to monitor BPs and understands I will follow up on Wednesday.  He understands to call office sooner if symptoms worsen or go to E.D. If necessary.

## 2016-10-19 NOTE — Telephone Encounter (Signed)
New message ° ° ° ° ° ° °Talk to the nurse regarding his medications °

## 2016-10-19 NOTE — Telephone Encounter (Signed)
Reports light-headedness since beginning BiDil.  Pt is going to try and get BP today before making any change to medication.  He understands I will call him back before leaving today.

## 2016-10-20 DIAGNOSIS — N186 End stage renal disease: Secondary | ICD-10-CM | POA: Diagnosis not present

## 2016-10-20 DIAGNOSIS — N2581 Secondary hyperparathyroidism of renal origin: Secondary | ICD-10-CM | POA: Diagnosis not present

## 2016-10-20 DIAGNOSIS — E1129 Type 2 diabetes mellitus with other diabetic kidney complication: Secondary | ICD-10-CM | POA: Diagnosis not present

## 2016-10-20 DIAGNOSIS — D631 Anemia in chronic kidney disease: Secondary | ICD-10-CM | POA: Diagnosis not present

## 2016-10-21 DIAGNOSIS — Z7901 Long term (current) use of anticoagulants: Secondary | ICD-10-CM | POA: Diagnosis not present

## 2016-10-21 NOTE — Telephone Encounter (Signed)
Followed up w/ pt on his BPs.  Pt tells me that they are "ok", reports SBP 112, 116.  Denies any dizziness/lightheadedness since speaking with me on Monday.  Pt is going to continue to monitor and call back if symptoms reoccur and/or BP drops to low.   Patient verbalized understanding and agreeable to plan.

## 2016-10-22 DIAGNOSIS — N2581 Secondary hyperparathyroidism of renal origin: Secondary | ICD-10-CM | POA: Diagnosis not present

## 2016-10-22 DIAGNOSIS — N186 End stage renal disease: Secondary | ICD-10-CM | POA: Diagnosis not present

## 2016-10-22 DIAGNOSIS — D631 Anemia in chronic kidney disease: Secondary | ICD-10-CM | POA: Diagnosis not present

## 2016-10-22 DIAGNOSIS — E1129 Type 2 diabetes mellitus with other diabetic kidney complication: Secondary | ICD-10-CM | POA: Diagnosis not present

## 2016-10-23 ENCOUNTER — Telehealth: Payer: Self-pay | Admitting: Cardiology

## 2016-10-23 NOTE — Telephone Encounter (Signed)
New message    Pt is calling about his blood pressure.    Pt c/o BP issue: STAT if pt c/o blurred vision, one-sided weakness or slurred speech  1. What are your last 5 BP readings? 140/79 p-92, 136/72 p-99, 138/57 p-100, 127/67 p-93, 108/61 p-90, 54/34 p-97, 61/40 p-98  2. Are you having any other symptoms (ex. Dizziness, headache, blurred vision, passed out)? Headache  3. What is your BP issue? Pt is calling to report his bp reading to RN.

## 2016-10-23 NOTE — Telephone Encounter (Signed)
Reviewed numbers with patient.  Discussed the last two readings being too low and that those numbers are not correct.  He isn't sure why they read that low.  Informed pt that if he gets another reading that low, to wait 5 min and take it again.  Pt is agreeable and will continue to monitor.

## 2016-10-24 DIAGNOSIS — D631 Anemia in chronic kidney disease: Secondary | ICD-10-CM | POA: Diagnosis not present

## 2016-10-24 DIAGNOSIS — N2581 Secondary hyperparathyroidism of renal origin: Secondary | ICD-10-CM | POA: Diagnosis not present

## 2016-10-24 DIAGNOSIS — E1129 Type 2 diabetes mellitus with other diabetic kidney complication: Secondary | ICD-10-CM | POA: Diagnosis not present

## 2016-10-24 DIAGNOSIS — N186 End stage renal disease: Secondary | ICD-10-CM | POA: Diagnosis not present

## 2016-10-27 DIAGNOSIS — N2581 Secondary hyperparathyroidism of renal origin: Secondary | ICD-10-CM | POA: Diagnosis not present

## 2016-10-27 DIAGNOSIS — E1129 Type 2 diabetes mellitus with other diabetic kidney complication: Secondary | ICD-10-CM | POA: Diagnosis not present

## 2016-10-27 DIAGNOSIS — D631 Anemia in chronic kidney disease: Secondary | ICD-10-CM | POA: Diagnosis not present

## 2016-10-27 DIAGNOSIS — N186 End stage renal disease: Secondary | ICD-10-CM | POA: Diagnosis not present

## 2016-10-29 DIAGNOSIS — E1129 Type 2 diabetes mellitus with other diabetic kidney complication: Secondary | ICD-10-CM | POA: Diagnosis not present

## 2016-10-29 DIAGNOSIS — Z992 Dependence on renal dialysis: Secondary | ICD-10-CM | POA: Diagnosis not present

## 2016-10-29 DIAGNOSIS — N2581 Secondary hyperparathyroidism of renal origin: Secondary | ICD-10-CM | POA: Diagnosis not present

## 2016-10-29 DIAGNOSIS — N186 End stage renal disease: Secondary | ICD-10-CM | POA: Diagnosis not present

## 2016-10-29 DIAGNOSIS — D631 Anemia in chronic kidney disease: Secondary | ICD-10-CM | POA: Diagnosis not present

## 2016-10-31 DIAGNOSIS — N186 End stage renal disease: Secondary | ICD-10-CM | POA: Diagnosis not present

## 2016-10-31 DIAGNOSIS — N2581 Secondary hyperparathyroidism of renal origin: Secondary | ICD-10-CM | POA: Diagnosis not present

## 2016-10-31 DIAGNOSIS — D631 Anemia in chronic kidney disease: Secondary | ICD-10-CM | POA: Diagnosis not present

## 2016-10-31 DIAGNOSIS — E1129 Type 2 diabetes mellitus with other diabetic kidney complication: Secondary | ICD-10-CM | POA: Diagnosis not present

## 2016-11-03 DIAGNOSIS — E1129 Type 2 diabetes mellitus with other diabetic kidney complication: Secondary | ICD-10-CM | POA: Diagnosis not present

## 2016-11-03 DIAGNOSIS — D631 Anemia in chronic kidney disease: Secondary | ICD-10-CM | POA: Diagnosis not present

## 2016-11-03 DIAGNOSIS — N2581 Secondary hyperparathyroidism of renal origin: Secondary | ICD-10-CM | POA: Diagnosis not present

## 2016-11-03 DIAGNOSIS — N186 End stage renal disease: Secondary | ICD-10-CM | POA: Diagnosis not present

## 2016-11-05 DIAGNOSIS — N186 End stage renal disease: Secondary | ICD-10-CM | POA: Diagnosis not present

## 2016-11-05 DIAGNOSIS — D631 Anemia in chronic kidney disease: Secondary | ICD-10-CM | POA: Diagnosis not present

## 2016-11-05 DIAGNOSIS — N2581 Secondary hyperparathyroidism of renal origin: Secondary | ICD-10-CM | POA: Diagnosis not present

## 2016-11-05 DIAGNOSIS — E1129 Type 2 diabetes mellitus with other diabetic kidney complication: Secondary | ICD-10-CM | POA: Diagnosis not present

## 2016-11-07 DIAGNOSIS — D631 Anemia in chronic kidney disease: Secondary | ICD-10-CM | POA: Diagnosis not present

## 2016-11-07 DIAGNOSIS — N186 End stage renal disease: Secondary | ICD-10-CM | POA: Diagnosis not present

## 2016-11-07 DIAGNOSIS — N2581 Secondary hyperparathyroidism of renal origin: Secondary | ICD-10-CM | POA: Diagnosis not present

## 2016-11-07 DIAGNOSIS — E1129 Type 2 diabetes mellitus with other diabetic kidney complication: Secondary | ICD-10-CM | POA: Diagnosis not present

## 2016-11-09 ENCOUNTER — Ambulatory Visit: Payer: Medicare Other | Admitting: Cardiology

## 2016-11-10 DIAGNOSIS — N2581 Secondary hyperparathyroidism of renal origin: Secondary | ICD-10-CM | POA: Diagnosis not present

## 2016-11-10 DIAGNOSIS — E1129 Type 2 diabetes mellitus with other diabetic kidney complication: Secondary | ICD-10-CM | POA: Diagnosis not present

## 2016-11-10 DIAGNOSIS — N186 End stage renal disease: Secondary | ICD-10-CM | POA: Diagnosis not present

## 2016-11-10 DIAGNOSIS — D631 Anemia in chronic kidney disease: Secondary | ICD-10-CM | POA: Diagnosis not present

## 2016-11-11 DIAGNOSIS — C4440 Unspecified malignant neoplasm of skin of scalp and neck: Secondary | ICD-10-CM | POA: Insufficient documentation

## 2016-11-11 DIAGNOSIS — R5383 Other fatigue: Secondary | ICD-10-CM | POA: Diagnosis not present

## 2016-11-11 DIAGNOSIS — R51 Headache: Secondary | ICD-10-CM | POA: Diagnosis not present

## 2016-11-11 DIAGNOSIS — C4490 Unspecified malignant neoplasm of skin, unspecified: Secondary | ICD-10-CM | POA: Diagnosis not present

## 2016-11-11 DIAGNOSIS — Z923 Personal history of irradiation: Secondary | ICD-10-CM | POA: Diagnosis not present

## 2016-11-11 DIAGNOSIS — Z08 Encounter for follow-up examination after completed treatment for malignant neoplasm: Secondary | ICD-10-CM | POA: Diagnosis not present

## 2016-11-11 DIAGNOSIS — Z992 Dependence on renal dialysis: Secondary | ICD-10-CM | POA: Diagnosis not present

## 2016-11-12 DIAGNOSIS — N186 End stage renal disease: Secondary | ICD-10-CM | POA: Diagnosis not present

## 2016-11-12 DIAGNOSIS — D631 Anemia in chronic kidney disease: Secondary | ICD-10-CM | POA: Diagnosis not present

## 2016-11-12 DIAGNOSIS — N2581 Secondary hyperparathyroidism of renal origin: Secondary | ICD-10-CM | POA: Diagnosis not present

## 2016-11-12 DIAGNOSIS — E1129 Type 2 diabetes mellitus with other diabetic kidney complication: Secondary | ICD-10-CM | POA: Diagnosis not present

## 2016-11-14 DIAGNOSIS — D631 Anemia in chronic kidney disease: Secondary | ICD-10-CM | POA: Diagnosis not present

## 2016-11-14 DIAGNOSIS — E1129 Type 2 diabetes mellitus with other diabetic kidney complication: Secondary | ICD-10-CM | POA: Diagnosis not present

## 2016-11-14 DIAGNOSIS — N186 End stage renal disease: Secondary | ICD-10-CM | POA: Diagnosis not present

## 2016-11-14 DIAGNOSIS — N2581 Secondary hyperparathyroidism of renal origin: Secondary | ICD-10-CM | POA: Diagnosis not present

## 2016-11-16 DIAGNOSIS — I10 Essential (primary) hypertension: Secondary | ICD-10-CM | POA: Diagnosis not present

## 2016-11-16 DIAGNOSIS — Z125 Encounter for screening for malignant neoplasm of prostate: Secondary | ICD-10-CM | POA: Diagnosis not present

## 2016-11-16 DIAGNOSIS — E1129 Type 2 diabetes mellitus with other diabetic kidney complication: Secondary | ICD-10-CM | POA: Diagnosis not present

## 2016-11-17 DIAGNOSIS — D631 Anemia in chronic kidney disease: Secondary | ICD-10-CM | POA: Diagnosis not present

## 2016-11-17 DIAGNOSIS — E1129 Type 2 diabetes mellitus with other diabetic kidney complication: Secondary | ICD-10-CM | POA: Diagnosis not present

## 2016-11-17 DIAGNOSIS — N2581 Secondary hyperparathyroidism of renal origin: Secondary | ICD-10-CM | POA: Diagnosis not present

## 2016-11-17 DIAGNOSIS — N186 End stage renal disease: Secondary | ICD-10-CM | POA: Diagnosis not present

## 2016-11-18 ENCOUNTER — Encounter: Payer: Self-pay | Admitting: Podiatry

## 2016-11-18 ENCOUNTER — Ambulatory Visit (INDEPENDENT_AMBULATORY_CARE_PROVIDER_SITE_OTHER): Payer: Medicare Other | Admitting: Podiatry

## 2016-11-18 DIAGNOSIS — E0842 Diabetes mellitus due to underlying condition with diabetic polyneuropathy: Secondary | ICD-10-CM

## 2016-11-18 DIAGNOSIS — B351 Tinea unguium: Secondary | ICD-10-CM

## 2016-11-18 DIAGNOSIS — M79676 Pain in unspecified toe(s): Secondary | ICD-10-CM | POA: Diagnosis not present

## 2016-11-19 DIAGNOSIS — D631 Anemia in chronic kidney disease: Secondary | ICD-10-CM | POA: Diagnosis not present

## 2016-11-19 DIAGNOSIS — N186 End stage renal disease: Secondary | ICD-10-CM | POA: Diagnosis not present

## 2016-11-19 DIAGNOSIS — N2581 Secondary hyperparathyroidism of renal origin: Secondary | ICD-10-CM | POA: Diagnosis not present

## 2016-11-19 DIAGNOSIS — E1129 Type 2 diabetes mellitus with other diabetic kidney complication: Secondary | ICD-10-CM | POA: Diagnosis not present

## 2016-11-21 DIAGNOSIS — E1129 Type 2 diabetes mellitus with other diabetic kidney complication: Secondary | ICD-10-CM | POA: Diagnosis not present

## 2016-11-21 DIAGNOSIS — N186 End stage renal disease: Secondary | ICD-10-CM | POA: Diagnosis not present

## 2016-11-21 DIAGNOSIS — D631 Anemia in chronic kidney disease: Secondary | ICD-10-CM | POA: Diagnosis not present

## 2016-11-21 DIAGNOSIS — N2581 Secondary hyperparathyroidism of renal origin: Secondary | ICD-10-CM | POA: Diagnosis not present

## 2016-11-22 NOTE — Progress Notes (Signed)
   SUBJECTIVE Patient with a history of diabetes mellitus presents to office today complaining of elongated, thickened nails. Pain while ambulating in shoes. Patient is unable to trim their own nails. Patient is also inquiring about diabetic shoes and states he has not heard anything regarding them.  OBJECTIVE General Patient is awake, alert, and oriented x 3 and in no acute distress. Derm Skin is dry and supple bilateral. Negative open lesions or macerations. Remaining integument unremarkable. Nails are tender, long, thickened and dystrophic with subungual debris, consistent with onychomycosis, 1-5 left lower extremity No signs of infection noted. Vasc  DP and PT pedal pulses palpable left. Temperature gradient within normal limits.  Neuro Epicritic and protective threshold sensation diminished left. Musculoskeletal Exam is to return as metatarsal amputation right foot-stable   ASSESSMENT 1. Diabetes Mellitus w/ peripheral neuropathy 2. Onychomycosis of nail due to dermatophyte bilateral 3. Pain in foot bilateral 4. History of TMA right  PLAN OF CARE 1. Patient evaluated today. 2. Instructed to maintain good pedal hygiene and foot care. Stressed importance of controlling blood sugar.  3. Mechanical debridement of nails 1-5 left performed using a nail nipper. Filed with dremel without incident.  4. Patient molded for DM shoes with toe filler right foot TMA. 5. Return to clinic in 3 mos.     Edrick Kins, DPM [facility]  Dr. Edrick Kins, [address]                                        [place], [place] [address]                Office [phone]  Fax [phone]

## 2016-11-23 DIAGNOSIS — Z23 Encounter for immunization: Secondary | ICD-10-CM | POA: Diagnosis not present

## 2016-11-23 DIAGNOSIS — E113599 Type 2 diabetes mellitus with proliferative diabetic retinopathy without macular edema, unspecified eye: Secondary | ICD-10-CM | POA: Diagnosis not present

## 2016-11-23 DIAGNOSIS — E784 Other hyperlipidemia: Secondary | ICD-10-CM | POA: Diagnosis not present

## 2016-11-23 DIAGNOSIS — G629 Polyneuropathy, unspecified: Secondary | ICD-10-CM | POA: Diagnosis not present

## 2016-11-23 DIAGNOSIS — E1129 Type 2 diabetes mellitus with other diabetic kidney complication: Secondary | ICD-10-CM | POA: Diagnosis not present

## 2016-11-23 DIAGNOSIS — I4891 Unspecified atrial fibrillation: Secondary | ICD-10-CM | POA: Diagnosis not present

## 2016-11-23 DIAGNOSIS — Z0000 Encounter for general adult medical examination without abnormal findings: Secondary | ICD-10-CM | POA: Diagnosis not present

## 2016-11-23 DIAGNOSIS — E1139 Type 2 diabetes mellitus with other diabetic ophthalmic complication: Secondary | ICD-10-CM | POA: Diagnosis not present

## 2016-11-23 DIAGNOSIS — E1140 Type 2 diabetes mellitus with diabetic neuropathy, unspecified: Secondary | ICD-10-CM | POA: Diagnosis not present

## 2016-11-23 DIAGNOSIS — I10 Essential (primary) hypertension: Secondary | ICD-10-CM | POA: Diagnosis not present

## 2016-11-23 DIAGNOSIS — N186 End stage renal disease: Secondary | ICD-10-CM | POA: Diagnosis not present

## 2016-11-23 DIAGNOSIS — Z1389 Encounter for screening for other disorder: Secondary | ICD-10-CM | POA: Diagnosis not present

## 2016-11-24 DIAGNOSIS — E1129 Type 2 diabetes mellitus with other diabetic kidney complication: Secondary | ICD-10-CM | POA: Diagnosis not present

## 2016-11-24 DIAGNOSIS — N186 End stage renal disease: Secondary | ICD-10-CM | POA: Diagnosis not present

## 2016-11-24 DIAGNOSIS — D631 Anemia in chronic kidney disease: Secondary | ICD-10-CM | POA: Diagnosis not present

## 2016-11-24 DIAGNOSIS — N2581 Secondary hyperparathyroidism of renal origin: Secondary | ICD-10-CM | POA: Diagnosis not present

## 2016-11-25 ENCOUNTER — Ambulatory Visit: Payer: Medicare Other | Admitting: Orthotics

## 2016-11-26 DIAGNOSIS — E1129 Type 2 diabetes mellitus with other diabetic kidney complication: Secondary | ICD-10-CM | POA: Diagnosis not present

## 2016-11-26 DIAGNOSIS — N186 End stage renal disease: Secondary | ICD-10-CM | POA: Diagnosis not present

## 2016-11-26 DIAGNOSIS — D631 Anemia in chronic kidney disease: Secondary | ICD-10-CM | POA: Diagnosis not present

## 2016-11-26 DIAGNOSIS — N2581 Secondary hyperparathyroidism of renal origin: Secondary | ICD-10-CM | POA: Diagnosis not present

## 2016-11-28 DIAGNOSIS — N2581 Secondary hyperparathyroidism of renal origin: Secondary | ICD-10-CM | POA: Diagnosis not present

## 2016-11-28 DIAGNOSIS — N186 End stage renal disease: Secondary | ICD-10-CM | POA: Diagnosis not present

## 2016-11-28 DIAGNOSIS — Z992 Dependence on renal dialysis: Secondary | ICD-10-CM | POA: Diagnosis not present

## 2016-11-28 DIAGNOSIS — D631 Anemia in chronic kidney disease: Secondary | ICD-10-CM | POA: Diagnosis not present

## 2016-11-28 DIAGNOSIS — E1129 Type 2 diabetes mellitus with other diabetic kidney complication: Secondary | ICD-10-CM | POA: Diagnosis not present

## 2016-12-01 ENCOUNTER — Ambulatory Visit (INDEPENDENT_AMBULATORY_CARE_PROVIDER_SITE_OTHER): Payer: Medicare Other | Admitting: Orthotics

## 2016-12-01 DIAGNOSIS — N186 End stage renal disease: Secondary | ICD-10-CM | POA: Diagnosis not present

## 2016-12-01 DIAGNOSIS — E1129 Type 2 diabetes mellitus with other diabetic kidney complication: Secondary | ICD-10-CM | POA: Diagnosis not present

## 2016-12-01 DIAGNOSIS — Z89431 Acquired absence of right foot: Secondary | ICD-10-CM

## 2016-12-01 DIAGNOSIS — N2581 Secondary hyperparathyroidism of renal origin: Secondary | ICD-10-CM | POA: Diagnosis not present

## 2016-12-01 DIAGNOSIS — E0842 Diabetes mellitus due to underlying condition with diabetic polyneuropathy: Secondary | ICD-10-CM

## 2016-12-01 DIAGNOSIS — D509 Iron deficiency anemia, unspecified: Secondary | ICD-10-CM | POA: Diagnosis not present

## 2016-12-01 NOTE — Progress Notes (Signed)
Patient presents today for diabetic shoe measurement and foam casting.  Goals of diabetic shoes/inserts to offer protection from conditions secondary to DM2, offer relief from sheer forces that could lead to ulcerations, protect the foot, and offer greater stability. Patient is under supervision of DPM evans Physician managing patients DM2: aronson, richard Patient has following documented conditions to qualify for diabetic shoes/inserts: DM2, Polyneuropathy, TMA Patient measured with brannock device: 13W Patient chose [id number]

## 2016-12-03 DIAGNOSIS — N2581 Secondary hyperparathyroidism of renal origin: Secondary | ICD-10-CM | POA: Diagnosis not present

## 2016-12-03 DIAGNOSIS — N186 End stage renal disease: Secondary | ICD-10-CM | POA: Diagnosis not present

## 2016-12-03 DIAGNOSIS — E1129 Type 2 diabetes mellitus with other diabetic kidney complication: Secondary | ICD-10-CM | POA: Diagnosis not present

## 2016-12-03 DIAGNOSIS — D509 Iron deficiency anemia, unspecified: Secondary | ICD-10-CM | POA: Diagnosis not present

## 2016-12-05 DIAGNOSIS — N186 End stage renal disease: Secondary | ICD-10-CM | POA: Diagnosis not present

## 2016-12-05 DIAGNOSIS — D509 Iron deficiency anemia, unspecified: Secondary | ICD-10-CM | POA: Diagnosis not present

## 2016-12-05 DIAGNOSIS — N2581 Secondary hyperparathyroidism of renal origin: Secondary | ICD-10-CM | POA: Diagnosis not present

## 2016-12-05 DIAGNOSIS — E1129 Type 2 diabetes mellitus with other diabetic kidney complication: Secondary | ICD-10-CM | POA: Diagnosis not present

## 2016-12-08 DIAGNOSIS — N2581 Secondary hyperparathyroidism of renal origin: Secondary | ICD-10-CM | POA: Diagnosis not present

## 2016-12-08 DIAGNOSIS — E1129 Type 2 diabetes mellitus with other diabetic kidney complication: Secondary | ICD-10-CM | POA: Diagnosis not present

## 2016-12-08 DIAGNOSIS — N186 End stage renal disease: Secondary | ICD-10-CM | POA: Diagnosis not present

## 2016-12-08 DIAGNOSIS — D509 Iron deficiency anemia, unspecified: Secondary | ICD-10-CM | POA: Diagnosis not present

## 2016-12-10 DIAGNOSIS — N2581 Secondary hyperparathyroidism of renal origin: Secondary | ICD-10-CM | POA: Diagnosis not present

## 2016-12-10 DIAGNOSIS — N186 End stage renal disease: Secondary | ICD-10-CM | POA: Diagnosis not present

## 2016-12-10 DIAGNOSIS — D509 Iron deficiency anemia, unspecified: Secondary | ICD-10-CM | POA: Diagnosis not present

## 2016-12-10 DIAGNOSIS — E1129 Type 2 diabetes mellitus with other diabetic kidney complication: Secondary | ICD-10-CM | POA: Diagnosis not present

## 2016-12-12 DIAGNOSIS — E1129 Type 2 diabetes mellitus with other diabetic kidney complication: Secondary | ICD-10-CM | POA: Diagnosis not present

## 2016-12-12 DIAGNOSIS — N186 End stage renal disease: Secondary | ICD-10-CM | POA: Diagnosis not present

## 2016-12-12 DIAGNOSIS — N2581 Secondary hyperparathyroidism of renal origin: Secondary | ICD-10-CM | POA: Diagnosis not present

## 2016-12-12 DIAGNOSIS — D509 Iron deficiency anemia, unspecified: Secondary | ICD-10-CM | POA: Diagnosis not present

## 2016-12-15 DIAGNOSIS — E1129 Type 2 diabetes mellitus with other diabetic kidney complication: Secondary | ICD-10-CM | POA: Diagnosis not present

## 2016-12-15 DIAGNOSIS — D509 Iron deficiency anemia, unspecified: Secondary | ICD-10-CM | POA: Diagnosis not present

## 2016-12-15 DIAGNOSIS — N2581 Secondary hyperparathyroidism of renal origin: Secondary | ICD-10-CM | POA: Diagnosis not present

## 2016-12-15 DIAGNOSIS — N186 End stage renal disease: Secondary | ICD-10-CM | POA: Diagnosis not present

## 2016-12-17 DIAGNOSIS — N186 End stage renal disease: Secondary | ICD-10-CM | POA: Diagnosis not present

## 2016-12-17 DIAGNOSIS — N2581 Secondary hyperparathyroidism of renal origin: Secondary | ICD-10-CM | POA: Diagnosis not present

## 2016-12-17 DIAGNOSIS — D509 Iron deficiency anemia, unspecified: Secondary | ICD-10-CM | POA: Diagnosis not present

## 2016-12-17 DIAGNOSIS — E1129 Type 2 diabetes mellitus with other diabetic kidney complication: Secondary | ICD-10-CM | POA: Diagnosis not present

## 2016-12-19 DIAGNOSIS — N186 End stage renal disease: Secondary | ICD-10-CM | POA: Diagnosis not present

## 2016-12-19 DIAGNOSIS — D509 Iron deficiency anemia, unspecified: Secondary | ICD-10-CM | POA: Diagnosis not present

## 2016-12-19 DIAGNOSIS — E1129 Type 2 diabetes mellitus with other diabetic kidney complication: Secondary | ICD-10-CM | POA: Diagnosis not present

## 2016-12-19 DIAGNOSIS — N2581 Secondary hyperparathyroidism of renal origin: Secondary | ICD-10-CM | POA: Diagnosis not present

## 2016-12-22 DIAGNOSIS — D509 Iron deficiency anemia, unspecified: Secondary | ICD-10-CM | POA: Diagnosis not present

## 2016-12-22 DIAGNOSIS — N186 End stage renal disease: Secondary | ICD-10-CM | POA: Diagnosis not present

## 2016-12-22 DIAGNOSIS — N2581 Secondary hyperparathyroidism of renal origin: Secondary | ICD-10-CM | POA: Diagnosis not present

## 2016-12-22 DIAGNOSIS — E1129 Type 2 diabetes mellitus with other diabetic kidney complication: Secondary | ICD-10-CM | POA: Diagnosis not present

## 2016-12-23 DIAGNOSIS — I4891 Unspecified atrial fibrillation: Secondary | ICD-10-CM | POA: Diagnosis not present

## 2016-12-23 DIAGNOSIS — Z7901 Long term (current) use of anticoagulants: Secondary | ICD-10-CM | POA: Diagnosis not present

## 2016-12-24 DIAGNOSIS — D509 Iron deficiency anemia, unspecified: Secondary | ICD-10-CM | POA: Diagnosis not present

## 2016-12-24 DIAGNOSIS — N2581 Secondary hyperparathyroidism of renal origin: Secondary | ICD-10-CM | POA: Diagnosis not present

## 2016-12-24 DIAGNOSIS — N186 End stage renal disease: Secondary | ICD-10-CM | POA: Diagnosis not present

## 2016-12-24 DIAGNOSIS — E1129 Type 2 diabetes mellitus with other diabetic kidney complication: Secondary | ICD-10-CM | POA: Diagnosis not present

## 2016-12-26 DIAGNOSIS — D509 Iron deficiency anemia, unspecified: Secondary | ICD-10-CM | POA: Diagnosis not present

## 2016-12-26 DIAGNOSIS — N186 End stage renal disease: Secondary | ICD-10-CM | POA: Diagnosis not present

## 2016-12-26 DIAGNOSIS — N2581 Secondary hyperparathyroidism of renal origin: Secondary | ICD-10-CM | POA: Diagnosis not present

## 2016-12-26 DIAGNOSIS — E1129 Type 2 diabetes mellitus with other diabetic kidney complication: Secondary | ICD-10-CM | POA: Diagnosis not present

## 2016-12-28 ENCOUNTER — Ambulatory Visit: Payer: Medicare Other | Admitting: Orthotics

## 2016-12-29 DIAGNOSIS — N186 End stage renal disease: Secondary | ICD-10-CM | POA: Diagnosis not present

## 2016-12-29 DIAGNOSIS — N2581 Secondary hyperparathyroidism of renal origin: Secondary | ICD-10-CM | POA: Diagnosis not present

## 2016-12-29 DIAGNOSIS — Z992 Dependence on renal dialysis: Secondary | ICD-10-CM | POA: Diagnosis not present

## 2016-12-29 DIAGNOSIS — D509 Iron deficiency anemia, unspecified: Secondary | ICD-10-CM | POA: Diagnosis not present

## 2016-12-29 DIAGNOSIS — E1129 Type 2 diabetes mellitus with other diabetic kidney complication: Secondary | ICD-10-CM | POA: Diagnosis not present

## 2016-12-31 DIAGNOSIS — N2581 Secondary hyperparathyroidism of renal origin: Secondary | ICD-10-CM | POA: Diagnosis not present

## 2016-12-31 DIAGNOSIS — N186 End stage renal disease: Secondary | ICD-10-CM | POA: Diagnosis not present

## 2016-12-31 DIAGNOSIS — E1129 Type 2 diabetes mellitus with other diabetic kidney complication: Secondary | ICD-10-CM | POA: Diagnosis not present

## 2017-01-02 DIAGNOSIS — N186 End stage renal disease: Secondary | ICD-10-CM | POA: Diagnosis not present

## 2017-01-02 DIAGNOSIS — N2581 Secondary hyperparathyroidism of renal origin: Secondary | ICD-10-CM | POA: Diagnosis not present

## 2017-01-02 DIAGNOSIS — E1129 Type 2 diabetes mellitus with other diabetic kidney complication: Secondary | ICD-10-CM | POA: Diagnosis not present

## 2017-01-05 DIAGNOSIS — N2581 Secondary hyperparathyroidism of renal origin: Secondary | ICD-10-CM | POA: Diagnosis not present

## 2017-01-05 DIAGNOSIS — E1129 Type 2 diabetes mellitus with other diabetic kidney complication: Secondary | ICD-10-CM | POA: Diagnosis not present

## 2017-01-05 DIAGNOSIS — N186 End stage renal disease: Secondary | ICD-10-CM | POA: Diagnosis not present

## 2017-01-06 DIAGNOSIS — I4891 Unspecified atrial fibrillation: Secondary | ICD-10-CM | POA: Diagnosis not present

## 2017-01-06 DIAGNOSIS — Z7901 Long term (current) use of anticoagulants: Secondary | ICD-10-CM | POA: Diagnosis not present

## 2017-01-07 DIAGNOSIS — N2581 Secondary hyperparathyroidism of renal origin: Secondary | ICD-10-CM | POA: Diagnosis not present

## 2017-01-07 DIAGNOSIS — E1129 Type 2 diabetes mellitus with other diabetic kidney complication: Secondary | ICD-10-CM | POA: Diagnosis not present

## 2017-01-07 DIAGNOSIS — N186 End stage renal disease: Secondary | ICD-10-CM | POA: Diagnosis not present

## 2017-01-09 DIAGNOSIS — N186 End stage renal disease: Secondary | ICD-10-CM | POA: Diagnosis not present

## 2017-01-09 DIAGNOSIS — N2581 Secondary hyperparathyroidism of renal origin: Secondary | ICD-10-CM | POA: Diagnosis not present

## 2017-01-09 DIAGNOSIS — E1129 Type 2 diabetes mellitus with other diabetic kidney complication: Secondary | ICD-10-CM | POA: Diagnosis not present

## 2017-01-11 ENCOUNTER — Ambulatory Visit (INDEPENDENT_AMBULATORY_CARE_PROVIDER_SITE_OTHER): Payer: Medicare Other | Admitting: Orthotics

## 2017-01-11 DIAGNOSIS — I739 Peripheral vascular disease, unspecified: Secondary | ICD-10-CM

## 2017-01-11 DIAGNOSIS — Z89431 Acquired absence of right foot: Secondary | ICD-10-CM

## 2017-01-11 DIAGNOSIS — L97513 Non-pressure chronic ulcer of other part of right foot with necrosis of muscle: Secondary | ICD-10-CM

## 2017-01-11 DIAGNOSIS — L97512 Non-pressure chronic ulcer of other part of right foot with fat layer exposed: Secondary | ICD-10-CM | POA: Diagnosis not present

## 2017-01-11 NOTE — Progress Notes (Signed)
Patient came in today to pick up diabetic shoes and Toefiller (R).  Same was well pleased with fit and function.   The patient could ambulate without any discomfort; there were no signs of any quality issues. The foot ortheses offered full contact with plantar surface and contoured the arch well.   The shoes fit well with no heel slippage and areas of pressure concern.   Patient advised to contact us if any problems arise.  Patient also advised on how to report any issues.

## 2017-01-12 DIAGNOSIS — N186 End stage renal disease: Secondary | ICD-10-CM | POA: Diagnosis not present

## 2017-01-12 DIAGNOSIS — E1129 Type 2 diabetes mellitus with other diabetic kidney complication: Secondary | ICD-10-CM | POA: Diagnosis not present

## 2017-01-12 DIAGNOSIS — N2581 Secondary hyperparathyroidism of renal origin: Secondary | ICD-10-CM | POA: Diagnosis not present

## 2017-01-14 DIAGNOSIS — E1129 Type 2 diabetes mellitus with other diabetic kidney complication: Secondary | ICD-10-CM | POA: Diagnosis not present

## 2017-01-14 DIAGNOSIS — N186 End stage renal disease: Secondary | ICD-10-CM | POA: Diagnosis not present

## 2017-01-14 DIAGNOSIS — N2581 Secondary hyperparathyroidism of renal origin: Secondary | ICD-10-CM | POA: Diagnosis not present

## 2017-01-16 DIAGNOSIS — E1129 Type 2 diabetes mellitus with other diabetic kidney complication: Secondary | ICD-10-CM | POA: Diagnosis not present

## 2017-01-16 DIAGNOSIS — N2581 Secondary hyperparathyroidism of renal origin: Secondary | ICD-10-CM | POA: Diagnosis not present

## 2017-01-16 DIAGNOSIS — N186 End stage renal disease: Secondary | ICD-10-CM | POA: Diagnosis not present

## 2017-01-19 DIAGNOSIS — N2581 Secondary hyperparathyroidism of renal origin: Secondary | ICD-10-CM | POA: Diagnosis not present

## 2017-01-19 DIAGNOSIS — N186 End stage renal disease: Secondary | ICD-10-CM | POA: Diagnosis not present

## 2017-01-19 DIAGNOSIS — E1129 Type 2 diabetes mellitus with other diabetic kidney complication: Secondary | ICD-10-CM | POA: Diagnosis not present

## 2017-01-20 DIAGNOSIS — Z7901 Long term (current) use of anticoagulants: Secondary | ICD-10-CM | POA: Diagnosis not present

## 2017-01-20 DIAGNOSIS — I4891 Unspecified atrial fibrillation: Secondary | ICD-10-CM | POA: Diagnosis not present

## 2017-01-21 DIAGNOSIS — E1129 Type 2 diabetes mellitus with other diabetic kidney complication: Secondary | ICD-10-CM | POA: Diagnosis not present

## 2017-01-21 DIAGNOSIS — N2581 Secondary hyperparathyroidism of renal origin: Secondary | ICD-10-CM | POA: Diagnosis not present

## 2017-01-21 DIAGNOSIS — N186 End stage renal disease: Secondary | ICD-10-CM | POA: Diagnosis not present

## 2017-01-23 DIAGNOSIS — N186 End stage renal disease: Secondary | ICD-10-CM | POA: Diagnosis not present

## 2017-01-23 DIAGNOSIS — E1129 Type 2 diabetes mellitus with other diabetic kidney complication: Secondary | ICD-10-CM | POA: Diagnosis not present

## 2017-01-23 DIAGNOSIS — N2581 Secondary hyperparathyroidism of renal origin: Secondary | ICD-10-CM | POA: Diagnosis not present

## 2017-01-26 DIAGNOSIS — N186 End stage renal disease: Secondary | ICD-10-CM | POA: Diagnosis not present

## 2017-01-26 DIAGNOSIS — E1129 Type 2 diabetes mellitus with other diabetic kidney complication: Secondary | ICD-10-CM | POA: Diagnosis not present

## 2017-01-26 DIAGNOSIS — N2581 Secondary hyperparathyroidism of renal origin: Secondary | ICD-10-CM | POA: Diagnosis not present

## 2017-01-28 DIAGNOSIS — N186 End stage renal disease: Secondary | ICD-10-CM | POA: Diagnosis not present

## 2017-01-28 DIAGNOSIS — N2581 Secondary hyperparathyroidism of renal origin: Secondary | ICD-10-CM | POA: Diagnosis not present

## 2017-01-28 DIAGNOSIS — E1129 Type 2 diabetes mellitus with other diabetic kidney complication: Secondary | ICD-10-CM | POA: Diagnosis not present

## 2017-01-29 DIAGNOSIS — Z992 Dependence on renal dialysis: Secondary | ICD-10-CM | POA: Diagnosis not present

## 2017-01-29 DIAGNOSIS — E1129 Type 2 diabetes mellitus with other diabetic kidney complication: Secondary | ICD-10-CM | POA: Diagnosis not present

## 2017-01-29 DIAGNOSIS — N186 End stage renal disease: Secondary | ICD-10-CM | POA: Diagnosis not present

## 2017-01-30 DIAGNOSIS — E1129 Type 2 diabetes mellitus with other diabetic kidney complication: Secondary | ICD-10-CM | POA: Diagnosis not present

## 2017-01-30 DIAGNOSIS — N186 End stage renal disease: Secondary | ICD-10-CM | POA: Diagnosis not present

## 2017-01-30 DIAGNOSIS — N2581 Secondary hyperparathyroidism of renal origin: Secondary | ICD-10-CM | POA: Diagnosis not present

## 2017-01-30 DIAGNOSIS — Z23 Encounter for immunization: Secondary | ICD-10-CM | POA: Diagnosis not present

## 2017-02-02 DIAGNOSIS — E1129 Type 2 diabetes mellitus with other diabetic kidney complication: Secondary | ICD-10-CM | POA: Diagnosis not present

## 2017-02-02 DIAGNOSIS — Z23 Encounter for immunization: Secondary | ICD-10-CM | POA: Diagnosis not present

## 2017-02-02 DIAGNOSIS — N186 End stage renal disease: Secondary | ICD-10-CM | POA: Diagnosis not present

## 2017-02-02 DIAGNOSIS — N2581 Secondary hyperparathyroidism of renal origin: Secondary | ICD-10-CM | POA: Diagnosis not present

## 2017-02-04 DIAGNOSIS — N186 End stage renal disease: Secondary | ICD-10-CM | POA: Diagnosis not present

## 2017-02-04 DIAGNOSIS — E1129 Type 2 diabetes mellitus with other diabetic kidney complication: Secondary | ICD-10-CM | POA: Diagnosis not present

## 2017-02-04 DIAGNOSIS — Z23 Encounter for immunization: Secondary | ICD-10-CM | POA: Diagnosis not present

## 2017-02-04 DIAGNOSIS — N2581 Secondary hyperparathyroidism of renal origin: Secondary | ICD-10-CM | POA: Diagnosis not present

## 2017-02-06 DIAGNOSIS — E1129 Type 2 diabetes mellitus with other diabetic kidney complication: Secondary | ICD-10-CM | POA: Diagnosis not present

## 2017-02-06 DIAGNOSIS — N186 End stage renal disease: Secondary | ICD-10-CM | POA: Diagnosis not present

## 2017-02-06 DIAGNOSIS — Z23 Encounter for immunization: Secondary | ICD-10-CM | POA: Diagnosis not present

## 2017-02-06 DIAGNOSIS — N2581 Secondary hyperparathyroidism of renal origin: Secondary | ICD-10-CM | POA: Diagnosis not present

## 2017-02-09 DIAGNOSIS — N2581 Secondary hyperparathyroidism of renal origin: Secondary | ICD-10-CM | POA: Diagnosis not present

## 2017-02-09 DIAGNOSIS — N186 End stage renal disease: Secondary | ICD-10-CM | POA: Diagnosis not present

## 2017-02-09 DIAGNOSIS — Z23 Encounter for immunization: Secondary | ICD-10-CM | POA: Diagnosis not present

## 2017-02-09 DIAGNOSIS — E1129 Type 2 diabetes mellitus with other diabetic kidney complication: Secondary | ICD-10-CM | POA: Diagnosis not present

## 2017-02-11 DIAGNOSIS — N2581 Secondary hyperparathyroidism of renal origin: Secondary | ICD-10-CM | POA: Diagnosis not present

## 2017-02-11 DIAGNOSIS — E1129 Type 2 diabetes mellitus with other diabetic kidney complication: Secondary | ICD-10-CM | POA: Diagnosis not present

## 2017-02-11 DIAGNOSIS — N186 End stage renal disease: Secondary | ICD-10-CM | POA: Diagnosis not present

## 2017-02-11 DIAGNOSIS — Z23 Encounter for immunization: Secondary | ICD-10-CM | POA: Diagnosis not present

## 2017-02-13 DIAGNOSIS — E1129 Type 2 diabetes mellitus with other diabetic kidney complication: Secondary | ICD-10-CM | POA: Diagnosis not present

## 2017-02-13 DIAGNOSIS — N186 End stage renal disease: Secondary | ICD-10-CM | POA: Diagnosis not present

## 2017-02-13 DIAGNOSIS — Z23 Encounter for immunization: Secondary | ICD-10-CM | POA: Diagnosis not present

## 2017-02-13 DIAGNOSIS — N2581 Secondary hyperparathyroidism of renal origin: Secondary | ICD-10-CM | POA: Diagnosis not present

## 2017-02-16 DIAGNOSIS — Z23 Encounter for immunization: Secondary | ICD-10-CM | POA: Diagnosis not present

## 2017-02-16 DIAGNOSIS — N186 End stage renal disease: Secondary | ICD-10-CM | POA: Diagnosis not present

## 2017-02-16 DIAGNOSIS — N2581 Secondary hyperparathyroidism of renal origin: Secondary | ICD-10-CM | POA: Diagnosis not present

## 2017-02-16 DIAGNOSIS — E1129 Type 2 diabetes mellitus with other diabetic kidney complication: Secondary | ICD-10-CM | POA: Diagnosis not present

## 2017-02-18 DIAGNOSIS — N2581 Secondary hyperparathyroidism of renal origin: Secondary | ICD-10-CM | POA: Diagnosis not present

## 2017-02-18 DIAGNOSIS — Z23 Encounter for immunization: Secondary | ICD-10-CM | POA: Diagnosis not present

## 2017-02-18 DIAGNOSIS — N186 End stage renal disease: Secondary | ICD-10-CM | POA: Diagnosis not present

## 2017-02-18 DIAGNOSIS — E1129 Type 2 diabetes mellitus with other diabetic kidney complication: Secondary | ICD-10-CM | POA: Diagnosis not present

## 2017-02-20 DIAGNOSIS — Z23 Encounter for immunization: Secondary | ICD-10-CM | POA: Diagnosis not present

## 2017-02-20 DIAGNOSIS — N186 End stage renal disease: Secondary | ICD-10-CM | POA: Diagnosis not present

## 2017-02-20 DIAGNOSIS — N2581 Secondary hyperparathyroidism of renal origin: Secondary | ICD-10-CM | POA: Diagnosis not present

## 2017-02-20 DIAGNOSIS — E1129 Type 2 diabetes mellitus with other diabetic kidney complication: Secondary | ICD-10-CM | POA: Diagnosis not present

## 2017-02-23 DIAGNOSIS — E1129 Type 2 diabetes mellitus with other diabetic kidney complication: Secondary | ICD-10-CM | POA: Diagnosis not present

## 2017-02-23 DIAGNOSIS — N186 End stage renal disease: Secondary | ICD-10-CM | POA: Diagnosis not present

## 2017-02-23 DIAGNOSIS — Z23 Encounter for immunization: Secondary | ICD-10-CM | POA: Diagnosis not present

## 2017-02-23 DIAGNOSIS — N2581 Secondary hyperparathyroidism of renal origin: Secondary | ICD-10-CM | POA: Diagnosis not present

## 2017-02-25 DIAGNOSIS — N186 End stage renal disease: Secondary | ICD-10-CM | POA: Diagnosis not present

## 2017-02-25 DIAGNOSIS — E1129 Type 2 diabetes mellitus with other diabetic kidney complication: Secondary | ICD-10-CM | POA: Diagnosis not present

## 2017-02-25 DIAGNOSIS — N2581 Secondary hyperparathyroidism of renal origin: Secondary | ICD-10-CM | POA: Diagnosis not present

## 2017-02-25 DIAGNOSIS — Z23 Encounter for immunization: Secondary | ICD-10-CM | POA: Diagnosis not present

## 2017-02-27 DIAGNOSIS — N186 End stage renal disease: Secondary | ICD-10-CM | POA: Diagnosis not present

## 2017-02-27 DIAGNOSIS — Z23 Encounter for immunization: Secondary | ICD-10-CM | POA: Diagnosis not present

## 2017-02-27 DIAGNOSIS — E1129 Type 2 diabetes mellitus with other diabetic kidney complication: Secondary | ICD-10-CM | POA: Diagnosis not present

## 2017-02-27 DIAGNOSIS — N2581 Secondary hyperparathyroidism of renal origin: Secondary | ICD-10-CM | POA: Diagnosis not present

## 2017-02-28 DIAGNOSIS — N186 End stage renal disease: Secondary | ICD-10-CM | POA: Diagnosis not present

## 2017-02-28 DIAGNOSIS — E1129 Type 2 diabetes mellitus with other diabetic kidney complication: Secondary | ICD-10-CM | POA: Diagnosis not present

## 2017-02-28 DIAGNOSIS — Z992 Dependence on renal dialysis: Secondary | ICD-10-CM | POA: Diagnosis not present

## 2017-03-02 DIAGNOSIS — E1129 Type 2 diabetes mellitus with other diabetic kidney complication: Secondary | ICD-10-CM | POA: Diagnosis not present

## 2017-03-02 DIAGNOSIS — D631 Anemia in chronic kidney disease: Secondary | ICD-10-CM | POA: Diagnosis not present

## 2017-03-02 DIAGNOSIS — N2581 Secondary hyperparathyroidism of renal origin: Secondary | ICD-10-CM | POA: Diagnosis not present

## 2017-03-02 DIAGNOSIS — N186 End stage renal disease: Secondary | ICD-10-CM | POA: Diagnosis not present

## 2017-03-03 DIAGNOSIS — I4891 Unspecified atrial fibrillation: Secondary | ICD-10-CM | POA: Diagnosis not present

## 2017-03-03 DIAGNOSIS — Z7901 Long term (current) use of anticoagulants: Secondary | ICD-10-CM | POA: Diagnosis not present

## 2017-03-04 DIAGNOSIS — N2581 Secondary hyperparathyroidism of renal origin: Secondary | ICD-10-CM | POA: Diagnosis not present

## 2017-03-04 DIAGNOSIS — N186 End stage renal disease: Secondary | ICD-10-CM | POA: Diagnosis not present

## 2017-03-04 DIAGNOSIS — D631 Anemia in chronic kidney disease: Secondary | ICD-10-CM | POA: Diagnosis not present

## 2017-03-04 DIAGNOSIS — E1129 Type 2 diabetes mellitus with other diabetic kidney complication: Secondary | ICD-10-CM | POA: Diagnosis not present

## 2017-03-06 DIAGNOSIS — D631 Anemia in chronic kidney disease: Secondary | ICD-10-CM | POA: Diagnosis not present

## 2017-03-06 DIAGNOSIS — N2581 Secondary hyperparathyroidism of renal origin: Secondary | ICD-10-CM | POA: Diagnosis not present

## 2017-03-06 DIAGNOSIS — N186 End stage renal disease: Secondary | ICD-10-CM | POA: Diagnosis not present

## 2017-03-06 DIAGNOSIS — E1129 Type 2 diabetes mellitus with other diabetic kidney complication: Secondary | ICD-10-CM | POA: Diagnosis not present

## 2017-03-09 DIAGNOSIS — N2581 Secondary hyperparathyroidism of renal origin: Secondary | ICD-10-CM | POA: Diagnosis not present

## 2017-03-09 DIAGNOSIS — E1129 Type 2 diabetes mellitus with other diabetic kidney complication: Secondary | ICD-10-CM | POA: Diagnosis not present

## 2017-03-09 DIAGNOSIS — D631 Anemia in chronic kidney disease: Secondary | ICD-10-CM | POA: Diagnosis not present

## 2017-03-09 DIAGNOSIS — N186 End stage renal disease: Secondary | ICD-10-CM | POA: Diagnosis not present

## 2017-03-11 DIAGNOSIS — N2581 Secondary hyperparathyroidism of renal origin: Secondary | ICD-10-CM | POA: Diagnosis not present

## 2017-03-11 DIAGNOSIS — N186 End stage renal disease: Secondary | ICD-10-CM | POA: Diagnosis not present

## 2017-03-11 DIAGNOSIS — E1129 Type 2 diabetes mellitus with other diabetic kidney complication: Secondary | ICD-10-CM | POA: Diagnosis not present

## 2017-03-11 DIAGNOSIS — D631 Anemia in chronic kidney disease: Secondary | ICD-10-CM | POA: Diagnosis not present

## 2017-03-14 DIAGNOSIS — E1129 Type 2 diabetes mellitus with other diabetic kidney complication: Secondary | ICD-10-CM | POA: Diagnosis not present

## 2017-03-14 DIAGNOSIS — D631 Anemia in chronic kidney disease: Secondary | ICD-10-CM | POA: Diagnosis not present

## 2017-03-14 DIAGNOSIS — N186 End stage renal disease: Secondary | ICD-10-CM | POA: Diagnosis not present

## 2017-03-14 DIAGNOSIS — N2581 Secondary hyperparathyroidism of renal origin: Secondary | ICD-10-CM | POA: Diagnosis not present

## 2017-03-16 DIAGNOSIS — N2581 Secondary hyperparathyroidism of renal origin: Secondary | ICD-10-CM | POA: Diagnosis not present

## 2017-03-16 DIAGNOSIS — N186 End stage renal disease: Secondary | ICD-10-CM | POA: Diagnosis not present

## 2017-03-16 DIAGNOSIS — E1129 Type 2 diabetes mellitus with other diabetic kidney complication: Secondary | ICD-10-CM | POA: Diagnosis not present

## 2017-03-16 DIAGNOSIS — D631 Anemia in chronic kidney disease: Secondary | ICD-10-CM | POA: Diagnosis not present

## 2017-03-18 DIAGNOSIS — D631 Anemia in chronic kidney disease: Secondary | ICD-10-CM | POA: Diagnosis not present

## 2017-03-18 DIAGNOSIS — N186 End stage renal disease: Secondary | ICD-10-CM | POA: Diagnosis not present

## 2017-03-18 DIAGNOSIS — N2581 Secondary hyperparathyroidism of renal origin: Secondary | ICD-10-CM | POA: Diagnosis not present

## 2017-03-18 DIAGNOSIS — E1129 Type 2 diabetes mellitus with other diabetic kidney complication: Secondary | ICD-10-CM | POA: Diagnosis not present

## 2017-03-20 DIAGNOSIS — N2581 Secondary hyperparathyroidism of renal origin: Secondary | ICD-10-CM | POA: Diagnosis not present

## 2017-03-20 DIAGNOSIS — E1129 Type 2 diabetes mellitus with other diabetic kidney complication: Secondary | ICD-10-CM | POA: Diagnosis not present

## 2017-03-20 DIAGNOSIS — N186 End stage renal disease: Secondary | ICD-10-CM | POA: Diagnosis not present

## 2017-03-20 DIAGNOSIS — D631 Anemia in chronic kidney disease: Secondary | ICD-10-CM | POA: Diagnosis not present

## 2017-03-23 DIAGNOSIS — E1129 Type 2 diabetes mellitus with other diabetic kidney complication: Secondary | ICD-10-CM | POA: Diagnosis not present

## 2017-03-23 DIAGNOSIS — D631 Anemia in chronic kidney disease: Secondary | ICD-10-CM | POA: Diagnosis not present

## 2017-03-23 DIAGNOSIS — N186 End stage renal disease: Secondary | ICD-10-CM | POA: Diagnosis not present

## 2017-03-23 DIAGNOSIS — N2581 Secondary hyperparathyroidism of renal origin: Secondary | ICD-10-CM | POA: Diagnosis not present

## 2017-03-25 DIAGNOSIS — N2581 Secondary hyperparathyroidism of renal origin: Secondary | ICD-10-CM | POA: Diagnosis not present

## 2017-03-25 DIAGNOSIS — D631 Anemia in chronic kidney disease: Secondary | ICD-10-CM | POA: Diagnosis not present

## 2017-03-25 DIAGNOSIS — E1129 Type 2 diabetes mellitus with other diabetic kidney complication: Secondary | ICD-10-CM | POA: Diagnosis not present

## 2017-03-25 DIAGNOSIS — N186 End stage renal disease: Secondary | ICD-10-CM | POA: Diagnosis not present

## 2017-03-27 DIAGNOSIS — E1129 Type 2 diabetes mellitus with other diabetic kidney complication: Secondary | ICD-10-CM | POA: Diagnosis not present

## 2017-03-27 DIAGNOSIS — D631 Anemia in chronic kidney disease: Secondary | ICD-10-CM | POA: Diagnosis not present

## 2017-03-27 DIAGNOSIS — N186 End stage renal disease: Secondary | ICD-10-CM | POA: Diagnosis not present

## 2017-03-27 DIAGNOSIS — N2581 Secondary hyperparathyroidism of renal origin: Secondary | ICD-10-CM | POA: Diagnosis not present

## 2017-03-30 DIAGNOSIS — D631 Anemia in chronic kidney disease: Secondary | ICD-10-CM | POA: Diagnosis not present

## 2017-03-30 DIAGNOSIS — N2581 Secondary hyperparathyroidism of renal origin: Secondary | ICD-10-CM | POA: Diagnosis not present

## 2017-03-30 DIAGNOSIS — E1129 Type 2 diabetes mellitus with other diabetic kidney complication: Secondary | ICD-10-CM | POA: Diagnosis not present

## 2017-03-30 DIAGNOSIS — N186 End stage renal disease: Secondary | ICD-10-CM | POA: Diagnosis not present

## 2017-03-31 DIAGNOSIS — Z992 Dependence on renal dialysis: Secondary | ICD-10-CM | POA: Diagnosis not present

## 2017-03-31 DIAGNOSIS — E1129 Type 2 diabetes mellitus with other diabetic kidney complication: Secondary | ICD-10-CM | POA: Diagnosis not present

## 2017-03-31 DIAGNOSIS — N186 End stage renal disease: Secondary | ICD-10-CM | POA: Diagnosis not present

## 2017-04-01 DIAGNOSIS — E1129 Type 2 diabetes mellitus with other diabetic kidney complication: Secondary | ICD-10-CM | POA: Diagnosis not present

## 2017-04-01 DIAGNOSIS — N2581 Secondary hyperparathyroidism of renal origin: Secondary | ICD-10-CM | POA: Diagnosis not present

## 2017-04-01 DIAGNOSIS — D631 Anemia in chronic kidney disease: Secondary | ICD-10-CM | POA: Diagnosis not present

## 2017-04-01 DIAGNOSIS — N186 End stage renal disease: Secondary | ICD-10-CM | POA: Diagnosis not present

## 2017-04-03 DIAGNOSIS — N186 End stage renal disease: Secondary | ICD-10-CM | POA: Diagnosis not present

## 2017-04-03 DIAGNOSIS — D631 Anemia in chronic kidney disease: Secondary | ICD-10-CM | POA: Diagnosis not present

## 2017-04-03 DIAGNOSIS — N2581 Secondary hyperparathyroidism of renal origin: Secondary | ICD-10-CM | POA: Diagnosis not present

## 2017-04-03 DIAGNOSIS — E1129 Type 2 diabetes mellitus with other diabetic kidney complication: Secondary | ICD-10-CM | POA: Diagnosis not present

## 2017-04-06 DIAGNOSIS — D631 Anemia in chronic kidney disease: Secondary | ICD-10-CM | POA: Diagnosis not present

## 2017-04-06 DIAGNOSIS — N186 End stage renal disease: Secondary | ICD-10-CM | POA: Diagnosis not present

## 2017-04-06 DIAGNOSIS — E1129 Type 2 diabetes mellitus with other diabetic kidney complication: Secondary | ICD-10-CM | POA: Diagnosis not present

## 2017-04-06 DIAGNOSIS — N2581 Secondary hyperparathyroidism of renal origin: Secondary | ICD-10-CM | POA: Diagnosis not present

## 2017-04-07 DIAGNOSIS — Z7901 Long term (current) use of anticoagulants: Secondary | ICD-10-CM | POA: Diagnosis not present

## 2017-04-07 DIAGNOSIS — I4891 Unspecified atrial fibrillation: Secondary | ICD-10-CM | POA: Diagnosis not present

## 2017-04-08 DIAGNOSIS — N186 End stage renal disease: Secondary | ICD-10-CM | POA: Diagnosis not present

## 2017-04-08 DIAGNOSIS — D631 Anemia in chronic kidney disease: Secondary | ICD-10-CM | POA: Diagnosis not present

## 2017-04-08 DIAGNOSIS — N2581 Secondary hyperparathyroidism of renal origin: Secondary | ICD-10-CM | POA: Diagnosis not present

## 2017-04-08 DIAGNOSIS — E1129 Type 2 diabetes mellitus with other diabetic kidney complication: Secondary | ICD-10-CM | POA: Diagnosis not present

## 2017-04-10 DIAGNOSIS — E1129 Type 2 diabetes mellitus with other diabetic kidney complication: Secondary | ICD-10-CM | POA: Diagnosis not present

## 2017-04-10 DIAGNOSIS — D631 Anemia in chronic kidney disease: Secondary | ICD-10-CM | POA: Diagnosis not present

## 2017-04-10 DIAGNOSIS — N186 End stage renal disease: Secondary | ICD-10-CM | POA: Diagnosis not present

## 2017-04-10 DIAGNOSIS — N2581 Secondary hyperparathyroidism of renal origin: Secondary | ICD-10-CM | POA: Diagnosis not present

## 2017-04-13 DIAGNOSIS — E1129 Type 2 diabetes mellitus with other diabetic kidney complication: Secondary | ICD-10-CM | POA: Diagnosis not present

## 2017-04-13 DIAGNOSIS — D631 Anemia in chronic kidney disease: Secondary | ICD-10-CM | POA: Diagnosis not present

## 2017-04-13 DIAGNOSIS — N2581 Secondary hyperparathyroidism of renal origin: Secondary | ICD-10-CM | POA: Diagnosis not present

## 2017-04-13 DIAGNOSIS — N186 End stage renal disease: Secondary | ICD-10-CM | POA: Diagnosis not present

## 2017-04-15 DIAGNOSIS — N2581 Secondary hyperparathyroidism of renal origin: Secondary | ICD-10-CM | POA: Diagnosis not present

## 2017-04-15 DIAGNOSIS — D631 Anemia in chronic kidney disease: Secondary | ICD-10-CM | POA: Diagnosis not present

## 2017-04-15 DIAGNOSIS — E1129 Type 2 diabetes mellitus with other diabetic kidney complication: Secondary | ICD-10-CM | POA: Diagnosis not present

## 2017-04-15 DIAGNOSIS — N186 End stage renal disease: Secondary | ICD-10-CM | POA: Diagnosis not present

## 2017-04-17 DIAGNOSIS — E1129 Type 2 diabetes mellitus with other diabetic kidney complication: Secondary | ICD-10-CM | POA: Diagnosis not present

## 2017-04-17 DIAGNOSIS — N186 End stage renal disease: Secondary | ICD-10-CM | POA: Diagnosis not present

## 2017-04-17 DIAGNOSIS — N2581 Secondary hyperparathyroidism of renal origin: Secondary | ICD-10-CM | POA: Diagnosis not present

## 2017-04-17 DIAGNOSIS — D631 Anemia in chronic kidney disease: Secondary | ICD-10-CM | POA: Diagnosis not present

## 2017-04-19 DIAGNOSIS — D631 Anemia in chronic kidney disease: Secondary | ICD-10-CM | POA: Diagnosis not present

## 2017-04-19 DIAGNOSIS — N186 End stage renal disease: Secondary | ICD-10-CM | POA: Diagnosis not present

## 2017-04-19 DIAGNOSIS — E1129 Type 2 diabetes mellitus with other diabetic kidney complication: Secondary | ICD-10-CM | POA: Diagnosis not present

## 2017-04-19 DIAGNOSIS — N2581 Secondary hyperparathyroidism of renal origin: Secondary | ICD-10-CM | POA: Diagnosis not present

## 2017-04-21 DIAGNOSIS — E1129 Type 2 diabetes mellitus with other diabetic kidney complication: Secondary | ICD-10-CM | POA: Diagnosis not present

## 2017-04-21 DIAGNOSIS — D631 Anemia in chronic kidney disease: Secondary | ICD-10-CM | POA: Diagnosis not present

## 2017-04-21 DIAGNOSIS — N2581 Secondary hyperparathyroidism of renal origin: Secondary | ICD-10-CM | POA: Diagnosis not present

## 2017-04-21 DIAGNOSIS — N186 End stage renal disease: Secondary | ICD-10-CM | POA: Diagnosis not present

## 2017-04-23 DIAGNOSIS — I4891 Unspecified atrial fibrillation: Secondary | ICD-10-CM | POA: Diagnosis not present

## 2017-04-23 DIAGNOSIS — Z7901 Long term (current) use of anticoagulants: Secondary | ICD-10-CM | POA: Diagnosis not present

## 2017-04-24 DIAGNOSIS — D631 Anemia in chronic kidney disease: Secondary | ICD-10-CM | POA: Diagnosis not present

## 2017-04-24 DIAGNOSIS — N186 End stage renal disease: Secondary | ICD-10-CM | POA: Diagnosis not present

## 2017-04-24 DIAGNOSIS — E1129 Type 2 diabetes mellitus with other diabetic kidney complication: Secondary | ICD-10-CM | POA: Diagnosis not present

## 2017-04-24 DIAGNOSIS — N2581 Secondary hyperparathyroidism of renal origin: Secondary | ICD-10-CM | POA: Diagnosis not present

## 2017-04-27 DIAGNOSIS — E1129 Type 2 diabetes mellitus with other diabetic kidney complication: Secondary | ICD-10-CM | POA: Diagnosis not present

## 2017-04-27 DIAGNOSIS — N2581 Secondary hyperparathyroidism of renal origin: Secondary | ICD-10-CM | POA: Diagnosis not present

## 2017-04-27 DIAGNOSIS — N186 End stage renal disease: Secondary | ICD-10-CM | POA: Diagnosis not present

## 2017-04-27 DIAGNOSIS — D631 Anemia in chronic kidney disease: Secondary | ICD-10-CM | POA: Diagnosis not present

## 2017-04-29 DIAGNOSIS — N2581 Secondary hyperparathyroidism of renal origin: Secondary | ICD-10-CM | POA: Diagnosis not present

## 2017-04-29 DIAGNOSIS — N186 End stage renal disease: Secondary | ICD-10-CM | POA: Diagnosis not present

## 2017-04-29 DIAGNOSIS — D631 Anemia in chronic kidney disease: Secondary | ICD-10-CM | POA: Diagnosis not present

## 2017-04-29 DIAGNOSIS — E1129 Type 2 diabetes mellitus with other diabetic kidney complication: Secondary | ICD-10-CM | POA: Diagnosis not present

## 2017-04-30 DIAGNOSIS — E1129 Type 2 diabetes mellitus with other diabetic kidney complication: Secondary | ICD-10-CM | POA: Diagnosis not present

## 2017-04-30 DIAGNOSIS — Z992 Dependence on renal dialysis: Secondary | ICD-10-CM | POA: Diagnosis not present

## 2017-04-30 DIAGNOSIS — N186 End stage renal disease: Secondary | ICD-10-CM | POA: Diagnosis not present

## 2017-05-01 DIAGNOSIS — E1129 Type 2 diabetes mellitus with other diabetic kidney complication: Secondary | ICD-10-CM | POA: Diagnosis not present

## 2017-05-01 DIAGNOSIS — N2581 Secondary hyperparathyroidism of renal origin: Secondary | ICD-10-CM | POA: Diagnosis not present

## 2017-05-01 DIAGNOSIS — N186 End stage renal disease: Secondary | ICD-10-CM | POA: Diagnosis not present

## 2017-05-04 DIAGNOSIS — N186 End stage renal disease: Secondary | ICD-10-CM | POA: Diagnosis not present

## 2017-05-04 DIAGNOSIS — N2581 Secondary hyperparathyroidism of renal origin: Secondary | ICD-10-CM | POA: Diagnosis not present

## 2017-05-04 DIAGNOSIS — E1129 Type 2 diabetes mellitus with other diabetic kidney complication: Secondary | ICD-10-CM | POA: Diagnosis not present

## 2017-05-06 DIAGNOSIS — E1129 Type 2 diabetes mellitus with other diabetic kidney complication: Secondary | ICD-10-CM | POA: Diagnosis not present

## 2017-05-06 DIAGNOSIS — N186 End stage renal disease: Secondary | ICD-10-CM | POA: Diagnosis not present

## 2017-05-06 DIAGNOSIS — N2581 Secondary hyperparathyroidism of renal origin: Secondary | ICD-10-CM | POA: Diagnosis not present

## 2017-05-08 DIAGNOSIS — N2581 Secondary hyperparathyroidism of renal origin: Secondary | ICD-10-CM | POA: Diagnosis not present

## 2017-05-08 DIAGNOSIS — E1129 Type 2 diabetes mellitus with other diabetic kidney complication: Secondary | ICD-10-CM | POA: Diagnosis not present

## 2017-05-08 DIAGNOSIS — N186 End stage renal disease: Secondary | ICD-10-CM | POA: Diagnosis not present

## 2017-05-11 DIAGNOSIS — E1129 Type 2 diabetes mellitus with other diabetic kidney complication: Secondary | ICD-10-CM | POA: Diagnosis not present

## 2017-05-11 DIAGNOSIS — N2581 Secondary hyperparathyroidism of renal origin: Secondary | ICD-10-CM | POA: Diagnosis not present

## 2017-05-11 DIAGNOSIS — N186 End stage renal disease: Secondary | ICD-10-CM | POA: Diagnosis not present

## 2017-05-13 DIAGNOSIS — N2581 Secondary hyperparathyroidism of renal origin: Secondary | ICD-10-CM | POA: Diagnosis not present

## 2017-05-13 DIAGNOSIS — E1129 Type 2 diabetes mellitus with other diabetic kidney complication: Secondary | ICD-10-CM | POA: Diagnosis not present

## 2017-05-13 DIAGNOSIS — N186 End stage renal disease: Secondary | ICD-10-CM | POA: Diagnosis not present

## 2017-05-15 DIAGNOSIS — E1129 Type 2 diabetes mellitus with other diabetic kidney complication: Secondary | ICD-10-CM | POA: Diagnosis not present

## 2017-05-15 DIAGNOSIS — N186 End stage renal disease: Secondary | ICD-10-CM | POA: Diagnosis not present

## 2017-05-15 DIAGNOSIS — N2581 Secondary hyperparathyroidism of renal origin: Secondary | ICD-10-CM | POA: Diagnosis not present

## 2017-05-18 DIAGNOSIS — N2581 Secondary hyperparathyroidism of renal origin: Secondary | ICD-10-CM | POA: Diagnosis not present

## 2017-05-18 DIAGNOSIS — E1129 Type 2 diabetes mellitus with other diabetic kidney complication: Secondary | ICD-10-CM | POA: Diagnosis not present

## 2017-05-18 DIAGNOSIS — N186 End stage renal disease: Secondary | ICD-10-CM | POA: Diagnosis not present

## 2017-05-19 DIAGNOSIS — E1139 Type 2 diabetes mellitus with other diabetic ophthalmic complication: Secondary | ICD-10-CM | POA: Diagnosis not present

## 2017-05-19 DIAGNOSIS — E1129 Type 2 diabetes mellitus with other diabetic kidney complication: Secondary | ICD-10-CM | POA: Diagnosis not present

## 2017-05-19 DIAGNOSIS — G629 Polyneuropathy, unspecified: Secondary | ICD-10-CM | POA: Diagnosis not present

## 2017-05-19 DIAGNOSIS — N186 End stage renal disease: Secondary | ICD-10-CM | POA: Diagnosis not present

## 2017-05-19 DIAGNOSIS — E1140 Type 2 diabetes mellitus with diabetic neuropathy, unspecified: Secondary | ICD-10-CM | POA: Diagnosis not present

## 2017-05-19 DIAGNOSIS — Z7901 Long term (current) use of anticoagulants: Secondary | ICD-10-CM | POA: Diagnosis not present

## 2017-05-19 DIAGNOSIS — E7849 Other hyperlipidemia: Secondary | ICD-10-CM | POA: Diagnosis not present

## 2017-05-19 DIAGNOSIS — K432 Incisional hernia without obstruction or gangrene: Secondary | ICD-10-CM | POA: Diagnosis not present

## 2017-05-19 DIAGNOSIS — I4891 Unspecified atrial fibrillation: Secondary | ICD-10-CM | POA: Diagnosis not present

## 2017-05-19 DIAGNOSIS — Z23 Encounter for immunization: Secondary | ICD-10-CM | POA: Diagnosis not present

## 2017-05-19 DIAGNOSIS — D638 Anemia in other chronic diseases classified elsewhere: Secondary | ICD-10-CM | POA: Diagnosis not present

## 2017-05-19 DIAGNOSIS — Z6829 Body mass index (BMI) 29.0-29.9, adult: Secondary | ICD-10-CM | POA: Diagnosis not present

## 2017-05-20 DIAGNOSIS — N186 End stage renal disease: Secondary | ICD-10-CM | POA: Diagnosis not present

## 2017-05-20 DIAGNOSIS — E1129 Type 2 diabetes mellitus with other diabetic kidney complication: Secondary | ICD-10-CM | POA: Diagnosis not present

## 2017-05-20 DIAGNOSIS — N2581 Secondary hyperparathyroidism of renal origin: Secondary | ICD-10-CM | POA: Diagnosis not present

## 2017-05-22 DIAGNOSIS — N186 End stage renal disease: Secondary | ICD-10-CM | POA: Diagnosis not present

## 2017-05-22 DIAGNOSIS — N2581 Secondary hyperparathyroidism of renal origin: Secondary | ICD-10-CM | POA: Diagnosis not present

## 2017-05-22 DIAGNOSIS — E1129 Type 2 diabetes mellitus with other diabetic kidney complication: Secondary | ICD-10-CM | POA: Diagnosis not present

## 2017-05-24 DIAGNOSIS — E1129 Type 2 diabetes mellitus with other diabetic kidney complication: Secondary | ICD-10-CM | POA: Diagnosis not present

## 2017-05-24 DIAGNOSIS — N186 End stage renal disease: Secondary | ICD-10-CM | POA: Diagnosis not present

## 2017-05-24 DIAGNOSIS — N2581 Secondary hyperparathyroidism of renal origin: Secondary | ICD-10-CM | POA: Diagnosis not present

## 2017-05-27 DIAGNOSIS — N2581 Secondary hyperparathyroidism of renal origin: Secondary | ICD-10-CM | POA: Diagnosis not present

## 2017-05-27 DIAGNOSIS — E1129 Type 2 diabetes mellitus with other diabetic kidney complication: Secondary | ICD-10-CM | POA: Diagnosis not present

## 2017-05-27 DIAGNOSIS — N186 End stage renal disease: Secondary | ICD-10-CM | POA: Diagnosis not present

## 2017-05-29 DIAGNOSIS — E1129 Type 2 diabetes mellitus with other diabetic kidney complication: Secondary | ICD-10-CM | POA: Diagnosis not present

## 2017-05-29 DIAGNOSIS — N2581 Secondary hyperparathyroidism of renal origin: Secondary | ICD-10-CM | POA: Diagnosis not present

## 2017-05-29 DIAGNOSIS — N186 End stage renal disease: Secondary | ICD-10-CM | POA: Diagnosis not present

## 2017-05-31 DIAGNOSIS — E1129 Type 2 diabetes mellitus with other diabetic kidney complication: Secondary | ICD-10-CM | POA: Diagnosis not present

## 2017-05-31 DIAGNOSIS — Z992 Dependence on renal dialysis: Secondary | ICD-10-CM | POA: Diagnosis not present

## 2017-05-31 DIAGNOSIS — N2581 Secondary hyperparathyroidism of renal origin: Secondary | ICD-10-CM | POA: Diagnosis not present

## 2017-05-31 DIAGNOSIS — N186 End stage renal disease: Secondary | ICD-10-CM | POA: Diagnosis not present

## 2017-06-03 DIAGNOSIS — D509 Iron deficiency anemia, unspecified: Secondary | ICD-10-CM | POA: Diagnosis not present

## 2017-06-03 DIAGNOSIS — N186 End stage renal disease: Secondary | ICD-10-CM | POA: Diagnosis not present

## 2017-06-03 DIAGNOSIS — E1129 Type 2 diabetes mellitus with other diabetic kidney complication: Secondary | ICD-10-CM | POA: Diagnosis not present

## 2017-06-03 DIAGNOSIS — N2581 Secondary hyperparathyroidism of renal origin: Secondary | ICD-10-CM | POA: Diagnosis not present

## 2017-06-05 DIAGNOSIS — N2581 Secondary hyperparathyroidism of renal origin: Secondary | ICD-10-CM | POA: Diagnosis not present

## 2017-06-05 DIAGNOSIS — E1129 Type 2 diabetes mellitus with other diabetic kidney complication: Secondary | ICD-10-CM | POA: Diagnosis not present

## 2017-06-05 DIAGNOSIS — D509 Iron deficiency anemia, unspecified: Secondary | ICD-10-CM | POA: Diagnosis not present

## 2017-06-05 DIAGNOSIS — N186 End stage renal disease: Secondary | ICD-10-CM | POA: Diagnosis not present

## 2017-06-08 DIAGNOSIS — D509 Iron deficiency anemia, unspecified: Secondary | ICD-10-CM | POA: Diagnosis not present

## 2017-06-08 DIAGNOSIS — N2581 Secondary hyperparathyroidism of renal origin: Secondary | ICD-10-CM | POA: Diagnosis not present

## 2017-06-08 DIAGNOSIS — E1129 Type 2 diabetes mellitus with other diabetic kidney complication: Secondary | ICD-10-CM | POA: Diagnosis not present

## 2017-06-08 DIAGNOSIS — N186 End stage renal disease: Secondary | ICD-10-CM | POA: Diagnosis not present

## 2017-06-10 DIAGNOSIS — E1129 Type 2 diabetes mellitus with other diabetic kidney complication: Secondary | ICD-10-CM | POA: Diagnosis not present

## 2017-06-10 DIAGNOSIS — N2581 Secondary hyperparathyroidism of renal origin: Secondary | ICD-10-CM | POA: Diagnosis not present

## 2017-06-10 DIAGNOSIS — N186 End stage renal disease: Secondary | ICD-10-CM | POA: Diagnosis not present

## 2017-06-10 DIAGNOSIS — D509 Iron deficiency anemia, unspecified: Secondary | ICD-10-CM | POA: Diagnosis not present

## 2017-06-12 DIAGNOSIS — E1129 Type 2 diabetes mellitus with other diabetic kidney complication: Secondary | ICD-10-CM | POA: Diagnosis not present

## 2017-06-12 DIAGNOSIS — D509 Iron deficiency anemia, unspecified: Secondary | ICD-10-CM | POA: Diagnosis not present

## 2017-06-12 DIAGNOSIS — N186 End stage renal disease: Secondary | ICD-10-CM | POA: Diagnosis not present

## 2017-06-12 DIAGNOSIS — N2581 Secondary hyperparathyroidism of renal origin: Secondary | ICD-10-CM | POA: Diagnosis not present

## 2017-06-15 DIAGNOSIS — D509 Iron deficiency anemia, unspecified: Secondary | ICD-10-CM | POA: Diagnosis not present

## 2017-06-15 DIAGNOSIS — N2581 Secondary hyperparathyroidism of renal origin: Secondary | ICD-10-CM | POA: Diagnosis not present

## 2017-06-15 DIAGNOSIS — N186 End stage renal disease: Secondary | ICD-10-CM | POA: Diagnosis not present

## 2017-06-15 DIAGNOSIS — E1129 Type 2 diabetes mellitus with other diabetic kidney complication: Secondary | ICD-10-CM | POA: Diagnosis not present

## 2017-06-17 DIAGNOSIS — E1129 Type 2 diabetes mellitus with other diabetic kidney complication: Secondary | ICD-10-CM | POA: Diagnosis not present

## 2017-06-17 DIAGNOSIS — N186 End stage renal disease: Secondary | ICD-10-CM | POA: Diagnosis not present

## 2017-06-17 DIAGNOSIS — N2581 Secondary hyperparathyroidism of renal origin: Secondary | ICD-10-CM | POA: Diagnosis not present

## 2017-06-17 DIAGNOSIS — D509 Iron deficiency anemia, unspecified: Secondary | ICD-10-CM | POA: Diagnosis not present

## 2017-06-18 ENCOUNTER — Encounter (INDEPENDENT_AMBULATORY_CARE_PROVIDER_SITE_OTHER): Payer: Medicare Other | Admitting: Ophthalmology

## 2017-06-18 DIAGNOSIS — E113593 Type 2 diabetes mellitus with proliferative diabetic retinopathy without macular edema, bilateral: Secondary | ICD-10-CM | POA: Diagnosis not present

## 2017-06-18 DIAGNOSIS — H35033 Hypertensive retinopathy, bilateral: Secondary | ICD-10-CM

## 2017-06-18 DIAGNOSIS — E11319 Type 2 diabetes mellitus with unspecified diabetic retinopathy without macular edema: Secondary | ICD-10-CM | POA: Diagnosis not present

## 2017-06-18 DIAGNOSIS — I10 Essential (primary) hypertension: Secondary | ICD-10-CM

## 2017-06-19 DIAGNOSIS — D509 Iron deficiency anemia, unspecified: Secondary | ICD-10-CM | POA: Diagnosis not present

## 2017-06-19 DIAGNOSIS — N186 End stage renal disease: Secondary | ICD-10-CM | POA: Diagnosis not present

## 2017-06-19 DIAGNOSIS — N2581 Secondary hyperparathyroidism of renal origin: Secondary | ICD-10-CM | POA: Diagnosis not present

## 2017-06-19 DIAGNOSIS — E1129 Type 2 diabetes mellitus with other diabetic kidney complication: Secondary | ICD-10-CM | POA: Diagnosis not present

## 2017-06-22 DIAGNOSIS — N2581 Secondary hyperparathyroidism of renal origin: Secondary | ICD-10-CM | POA: Diagnosis not present

## 2017-06-22 DIAGNOSIS — D509 Iron deficiency anemia, unspecified: Secondary | ICD-10-CM | POA: Diagnosis not present

## 2017-06-22 DIAGNOSIS — E1129 Type 2 diabetes mellitus with other diabetic kidney complication: Secondary | ICD-10-CM | POA: Diagnosis not present

## 2017-06-22 DIAGNOSIS — N186 End stage renal disease: Secondary | ICD-10-CM | POA: Diagnosis not present

## 2017-06-24 DIAGNOSIS — E1129 Type 2 diabetes mellitus with other diabetic kidney complication: Secondary | ICD-10-CM | POA: Diagnosis not present

## 2017-06-24 DIAGNOSIS — N186 End stage renal disease: Secondary | ICD-10-CM | POA: Diagnosis not present

## 2017-06-24 DIAGNOSIS — N2581 Secondary hyperparathyroidism of renal origin: Secondary | ICD-10-CM | POA: Diagnosis not present

## 2017-06-24 DIAGNOSIS — D509 Iron deficiency anemia, unspecified: Secondary | ICD-10-CM | POA: Diagnosis not present

## 2017-06-26 DIAGNOSIS — N2581 Secondary hyperparathyroidism of renal origin: Secondary | ICD-10-CM | POA: Diagnosis not present

## 2017-06-26 DIAGNOSIS — D509 Iron deficiency anemia, unspecified: Secondary | ICD-10-CM | POA: Diagnosis not present

## 2017-06-26 DIAGNOSIS — N186 End stage renal disease: Secondary | ICD-10-CM | POA: Diagnosis not present

## 2017-06-26 DIAGNOSIS — E1129 Type 2 diabetes mellitus with other diabetic kidney complication: Secondary | ICD-10-CM | POA: Diagnosis not present

## 2017-06-29 DIAGNOSIS — N186 End stage renal disease: Secondary | ICD-10-CM | POA: Diagnosis not present

## 2017-06-29 DIAGNOSIS — D509 Iron deficiency anemia, unspecified: Secondary | ICD-10-CM | POA: Diagnosis not present

## 2017-06-29 DIAGNOSIS — E1129 Type 2 diabetes mellitus with other diabetic kidney complication: Secondary | ICD-10-CM | POA: Diagnosis not present

## 2017-06-29 DIAGNOSIS — N2581 Secondary hyperparathyroidism of renal origin: Secondary | ICD-10-CM | POA: Diagnosis not present

## 2017-07-01 DIAGNOSIS — N186 End stage renal disease: Secondary | ICD-10-CM | POA: Diagnosis not present

## 2017-07-01 DIAGNOSIS — N2581 Secondary hyperparathyroidism of renal origin: Secondary | ICD-10-CM | POA: Diagnosis not present

## 2017-07-01 DIAGNOSIS — D509 Iron deficiency anemia, unspecified: Secondary | ICD-10-CM | POA: Diagnosis not present

## 2017-07-01 DIAGNOSIS — Z992 Dependence on renal dialysis: Secondary | ICD-10-CM | POA: Diagnosis not present

## 2017-07-01 DIAGNOSIS — E1129 Type 2 diabetes mellitus with other diabetic kidney complication: Secondary | ICD-10-CM | POA: Diagnosis not present

## 2017-07-02 DIAGNOSIS — I4891 Unspecified atrial fibrillation: Secondary | ICD-10-CM | POA: Diagnosis not present

## 2017-07-02 DIAGNOSIS — Z7901 Long term (current) use of anticoagulants: Secondary | ICD-10-CM | POA: Diagnosis not present

## 2017-07-02 DIAGNOSIS — E1129 Type 2 diabetes mellitus with other diabetic kidney complication: Secondary | ICD-10-CM | POA: Diagnosis not present

## 2017-07-02 DIAGNOSIS — N186 End stage renal disease: Secondary | ICD-10-CM | POA: Diagnosis not present

## 2017-07-02 DIAGNOSIS — Z992 Dependence on renal dialysis: Secondary | ICD-10-CM | POA: Diagnosis not present

## 2017-07-03 DIAGNOSIS — N186 End stage renal disease: Secondary | ICD-10-CM | POA: Diagnosis not present

## 2017-07-03 DIAGNOSIS — N2581 Secondary hyperparathyroidism of renal origin: Secondary | ICD-10-CM | POA: Diagnosis not present

## 2017-07-03 DIAGNOSIS — D631 Anemia in chronic kidney disease: Secondary | ICD-10-CM | POA: Diagnosis not present

## 2017-07-03 DIAGNOSIS — E1129 Type 2 diabetes mellitus with other diabetic kidney complication: Secondary | ICD-10-CM | POA: Diagnosis not present

## 2017-07-06 DIAGNOSIS — N2581 Secondary hyperparathyroidism of renal origin: Secondary | ICD-10-CM | POA: Diagnosis not present

## 2017-07-06 DIAGNOSIS — E1129 Type 2 diabetes mellitus with other diabetic kidney complication: Secondary | ICD-10-CM | POA: Diagnosis not present

## 2017-07-06 DIAGNOSIS — D631 Anemia in chronic kidney disease: Secondary | ICD-10-CM | POA: Diagnosis not present

## 2017-07-06 DIAGNOSIS — N186 End stage renal disease: Secondary | ICD-10-CM | POA: Diagnosis not present

## 2017-07-08 DIAGNOSIS — E1129 Type 2 diabetes mellitus with other diabetic kidney complication: Secondary | ICD-10-CM | POA: Diagnosis not present

## 2017-07-08 DIAGNOSIS — N186 End stage renal disease: Secondary | ICD-10-CM | POA: Diagnosis not present

## 2017-07-08 DIAGNOSIS — N2581 Secondary hyperparathyroidism of renal origin: Secondary | ICD-10-CM | POA: Diagnosis not present

## 2017-07-08 DIAGNOSIS — D631 Anemia in chronic kidney disease: Secondary | ICD-10-CM | POA: Diagnosis not present

## 2017-07-09 ENCOUNTER — Ambulatory Visit: Payer: Medicare Other | Admitting: Orthotics

## 2017-07-09 DIAGNOSIS — Z7901 Long term (current) use of anticoagulants: Secondary | ICD-10-CM | POA: Diagnosis not present

## 2017-07-10 DIAGNOSIS — E1129 Type 2 diabetes mellitus with other diabetic kidney complication: Secondary | ICD-10-CM | POA: Diagnosis not present

## 2017-07-10 DIAGNOSIS — N186 End stage renal disease: Secondary | ICD-10-CM | POA: Diagnosis not present

## 2017-07-10 DIAGNOSIS — D631 Anemia in chronic kidney disease: Secondary | ICD-10-CM | POA: Diagnosis not present

## 2017-07-10 DIAGNOSIS — N2581 Secondary hyperparathyroidism of renal origin: Secondary | ICD-10-CM | POA: Diagnosis not present

## 2017-07-13 DIAGNOSIS — D631 Anemia in chronic kidney disease: Secondary | ICD-10-CM | POA: Diagnosis not present

## 2017-07-13 DIAGNOSIS — N186 End stage renal disease: Secondary | ICD-10-CM | POA: Diagnosis not present

## 2017-07-13 DIAGNOSIS — N2581 Secondary hyperparathyroidism of renal origin: Secondary | ICD-10-CM | POA: Diagnosis not present

## 2017-07-13 DIAGNOSIS — E1129 Type 2 diabetes mellitus with other diabetic kidney complication: Secondary | ICD-10-CM | POA: Diagnosis not present

## 2017-07-14 ENCOUNTER — Ambulatory Visit: Payer: Medicare Other | Admitting: Podiatry

## 2017-07-15 DIAGNOSIS — N2581 Secondary hyperparathyroidism of renal origin: Secondary | ICD-10-CM | POA: Diagnosis not present

## 2017-07-15 DIAGNOSIS — N186 End stage renal disease: Secondary | ICD-10-CM | POA: Diagnosis not present

## 2017-07-15 DIAGNOSIS — D631 Anemia in chronic kidney disease: Secondary | ICD-10-CM | POA: Diagnosis not present

## 2017-07-15 DIAGNOSIS — E1129 Type 2 diabetes mellitus with other diabetic kidney complication: Secondary | ICD-10-CM | POA: Diagnosis not present

## 2017-07-17 DIAGNOSIS — N186 End stage renal disease: Secondary | ICD-10-CM | POA: Diagnosis not present

## 2017-07-17 DIAGNOSIS — N2581 Secondary hyperparathyroidism of renal origin: Secondary | ICD-10-CM | POA: Diagnosis not present

## 2017-07-17 DIAGNOSIS — D631 Anemia in chronic kidney disease: Secondary | ICD-10-CM | POA: Diagnosis not present

## 2017-07-17 DIAGNOSIS — E1129 Type 2 diabetes mellitus with other diabetic kidney complication: Secondary | ICD-10-CM | POA: Diagnosis not present

## 2017-07-20 DIAGNOSIS — E1129 Type 2 diabetes mellitus with other diabetic kidney complication: Secondary | ICD-10-CM | POA: Diagnosis not present

## 2017-07-20 DIAGNOSIS — D631 Anemia in chronic kidney disease: Secondary | ICD-10-CM | POA: Diagnosis not present

## 2017-07-20 DIAGNOSIS — N186 End stage renal disease: Secondary | ICD-10-CM | POA: Diagnosis not present

## 2017-07-20 DIAGNOSIS — N2581 Secondary hyperparathyroidism of renal origin: Secondary | ICD-10-CM | POA: Diagnosis not present

## 2017-07-22 DIAGNOSIS — D631 Anemia in chronic kidney disease: Secondary | ICD-10-CM | POA: Diagnosis not present

## 2017-07-22 DIAGNOSIS — E1129 Type 2 diabetes mellitus with other diabetic kidney complication: Secondary | ICD-10-CM | POA: Diagnosis not present

## 2017-07-22 DIAGNOSIS — N186 End stage renal disease: Secondary | ICD-10-CM | POA: Diagnosis not present

## 2017-07-22 DIAGNOSIS — N2581 Secondary hyperparathyroidism of renal origin: Secondary | ICD-10-CM | POA: Diagnosis not present

## 2017-07-24 DIAGNOSIS — N2581 Secondary hyperparathyroidism of renal origin: Secondary | ICD-10-CM | POA: Diagnosis not present

## 2017-07-24 DIAGNOSIS — N186 End stage renal disease: Secondary | ICD-10-CM | POA: Diagnosis not present

## 2017-07-24 DIAGNOSIS — D631 Anemia in chronic kidney disease: Secondary | ICD-10-CM | POA: Diagnosis not present

## 2017-07-24 DIAGNOSIS — E1129 Type 2 diabetes mellitus with other diabetic kidney complication: Secondary | ICD-10-CM | POA: Diagnosis not present

## 2017-07-27 DIAGNOSIS — N186 End stage renal disease: Secondary | ICD-10-CM | POA: Diagnosis not present

## 2017-07-27 DIAGNOSIS — D631 Anemia in chronic kidney disease: Secondary | ICD-10-CM | POA: Diagnosis not present

## 2017-07-27 DIAGNOSIS — E1129 Type 2 diabetes mellitus with other diabetic kidney complication: Secondary | ICD-10-CM | POA: Diagnosis not present

## 2017-07-27 DIAGNOSIS — N2581 Secondary hyperparathyroidism of renal origin: Secondary | ICD-10-CM | POA: Diagnosis not present

## 2017-07-28 ENCOUNTER — Ambulatory Visit: Payer: Medicare Other | Admitting: Podiatry

## 2017-07-29 DIAGNOSIS — E1129 Type 2 diabetes mellitus with other diabetic kidney complication: Secondary | ICD-10-CM | POA: Diagnosis not present

## 2017-07-29 DIAGNOSIS — N2581 Secondary hyperparathyroidism of renal origin: Secondary | ICD-10-CM | POA: Diagnosis not present

## 2017-07-29 DIAGNOSIS — N186 End stage renal disease: Secondary | ICD-10-CM | POA: Diagnosis not present

## 2017-07-29 DIAGNOSIS — D631 Anemia in chronic kidney disease: Secondary | ICD-10-CM | POA: Diagnosis not present

## 2017-07-30 DIAGNOSIS — Z992 Dependence on renal dialysis: Secondary | ICD-10-CM | POA: Diagnosis not present

## 2017-07-30 DIAGNOSIS — N186 End stage renal disease: Secondary | ICD-10-CM | POA: Diagnosis not present

## 2017-07-30 DIAGNOSIS — E1129 Type 2 diabetes mellitus with other diabetic kidney complication: Secondary | ICD-10-CM | POA: Diagnosis not present

## 2017-07-31 DIAGNOSIS — N2581 Secondary hyperparathyroidism of renal origin: Secondary | ICD-10-CM | POA: Diagnosis not present

## 2017-07-31 DIAGNOSIS — N186 End stage renal disease: Secondary | ICD-10-CM | POA: Diagnosis not present

## 2017-07-31 DIAGNOSIS — D631 Anemia in chronic kidney disease: Secondary | ICD-10-CM | POA: Diagnosis not present

## 2017-07-31 DIAGNOSIS — E1129 Type 2 diabetes mellitus with other diabetic kidney complication: Secondary | ICD-10-CM | POA: Diagnosis not present

## 2017-08-02 DIAGNOSIS — N186 End stage renal disease: Secondary | ICD-10-CM | POA: Diagnosis not present

## 2017-08-02 DIAGNOSIS — Z992 Dependence on renal dialysis: Secondary | ICD-10-CM | POA: Diagnosis not present

## 2017-08-02 DIAGNOSIS — I871 Compression of vein: Secondary | ICD-10-CM | POA: Diagnosis not present

## 2017-08-02 DIAGNOSIS — T82858A Stenosis of vascular prosthetic devices, implants and grafts, initial encounter: Secondary | ICD-10-CM | POA: Diagnosis not present

## 2017-08-03 DIAGNOSIS — E1129 Type 2 diabetes mellitus with other diabetic kidney complication: Secondary | ICD-10-CM | POA: Diagnosis not present

## 2017-08-03 DIAGNOSIS — N2581 Secondary hyperparathyroidism of renal origin: Secondary | ICD-10-CM | POA: Diagnosis not present

## 2017-08-03 DIAGNOSIS — D631 Anemia in chronic kidney disease: Secondary | ICD-10-CM | POA: Diagnosis not present

## 2017-08-03 DIAGNOSIS — N186 End stage renal disease: Secondary | ICD-10-CM | POA: Diagnosis not present

## 2017-08-04 DIAGNOSIS — Z7901 Long term (current) use of anticoagulants: Secondary | ICD-10-CM | POA: Diagnosis not present

## 2017-08-04 DIAGNOSIS — I4891 Unspecified atrial fibrillation: Secondary | ICD-10-CM | POA: Diagnosis not present

## 2017-08-04 DIAGNOSIS — Z6830 Body mass index (BMI) 30.0-30.9, adult: Secondary | ICD-10-CM | POA: Diagnosis not present

## 2017-08-05 DIAGNOSIS — E1129 Type 2 diabetes mellitus with other diabetic kidney complication: Secondary | ICD-10-CM | POA: Diagnosis not present

## 2017-08-05 DIAGNOSIS — D631 Anemia in chronic kidney disease: Secondary | ICD-10-CM | POA: Diagnosis not present

## 2017-08-05 DIAGNOSIS — N186 End stage renal disease: Secondary | ICD-10-CM | POA: Diagnosis not present

## 2017-08-05 DIAGNOSIS — N2581 Secondary hyperparathyroidism of renal origin: Secondary | ICD-10-CM | POA: Diagnosis not present

## 2017-08-07 DIAGNOSIS — N186 End stage renal disease: Secondary | ICD-10-CM | POA: Diagnosis not present

## 2017-08-07 DIAGNOSIS — N2581 Secondary hyperparathyroidism of renal origin: Secondary | ICD-10-CM | POA: Diagnosis not present

## 2017-08-07 DIAGNOSIS — D631 Anemia in chronic kidney disease: Secondary | ICD-10-CM | POA: Diagnosis not present

## 2017-08-07 DIAGNOSIS — E1129 Type 2 diabetes mellitus with other diabetic kidney complication: Secondary | ICD-10-CM | POA: Diagnosis not present

## 2017-08-09 ENCOUNTER — Ambulatory Visit (INDEPENDENT_AMBULATORY_CARE_PROVIDER_SITE_OTHER): Payer: Medicare Other | Admitting: Podiatry

## 2017-08-09 ENCOUNTER — Encounter: Payer: Self-pay | Admitting: Podiatry

## 2017-08-09 DIAGNOSIS — Z89431 Acquired absence of right foot: Secondary | ICD-10-CM

## 2017-08-09 DIAGNOSIS — M79676 Pain in unspecified toe(s): Secondary | ICD-10-CM | POA: Diagnosis not present

## 2017-08-09 DIAGNOSIS — B351 Tinea unguium: Secondary | ICD-10-CM

## 2017-08-10 DIAGNOSIS — N2581 Secondary hyperparathyroidism of renal origin: Secondary | ICD-10-CM | POA: Diagnosis not present

## 2017-08-10 DIAGNOSIS — N186 End stage renal disease: Secondary | ICD-10-CM | POA: Diagnosis not present

## 2017-08-10 DIAGNOSIS — D631 Anemia in chronic kidney disease: Secondary | ICD-10-CM | POA: Diagnosis not present

## 2017-08-10 DIAGNOSIS — E1129 Type 2 diabetes mellitus with other diabetic kidney complication: Secondary | ICD-10-CM | POA: Diagnosis not present

## 2017-08-10 NOTE — Progress Notes (Signed)
   SUBJECTIVE 67 year old male presenting today with a chief complaint of elongated, thickened nails that cause pain while wearing shoes. He is unable to trim his own nails. Patient is here for further evaluation and treatment.   Past Medical History:  Diagnosis Date  . Anemia   . Arthritis    HNP- lumbar, "all over my body"  . Blood transfusion    "years ago; blood was low" (08/05/2013)  . CKD (chronic kidney disease) stage 4, GFR 15-29 ml/min (HCC) 03/18/2012   [place]- T,TH,Sat.  . Diabetic nephropathy ([place])   . Diabetic retinopathy   . DVT (deep venous thrombosis) ([place])    "got one in my right leg now; I've had one before too, not sure which leg" (08/05/2013)  . ESRD (end stage renal disease) on dialysis [facility])    "just started today, (08/04/2013)"  . Family history of anesthesia complication    " my son wakes up slowly"  . GERD (gastroesophageal reflux disease)    uses alka seltzere on occas.   [name])    "one q now and then" (08/05/2013)  . Hyperlipidemia   . Hypertension   . 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 Non Hodgkin's lymphoma ([place])    Tx 2009; "had chemo; it went away" (08/05/2013)  . Noncompliance 03/16/2012  . NSVT (nonsustained ventricular tachycardia) ([place]) 03/18/2012  . Peripheral vascular disease ([place])   . Pneumonia 2013   hosp.-   . Poor historian    pt. unsure of several answers to health history questions   . Skin cancer    melanoma - head  . Sleep apnea    "suppose to have a sleep study, but they never told me when. (08/05/2013)     [place] Patient is awake, alert, and oriented x 3 and in no acute distress. Derm Skin is dry and supple bilateral. Negative open lesions or macerations. Remaining integument unremarkable. Nails are tender, long, thickened and dystrophic with subungual debris, consistent with onychomycosis, 1-5 left lower  extremity No signs of infection noted. Vasc  DP and PT pedal pulses palpable left. Temperature gradient within normal limits.  Neuro Epicritic and protective threshold sensation diminished left. Musculoskeletal Exam is to return as metatarsal amputation right foot-stable   ASSESSMENT 1. Diabetes Mellitus w/ peripheral neuropathy 2. Onychomycosis of nail due to dermatophyte bilateral 3. Pain in foot bilateral 4. History of TMA right  PLAN OF CARE 1. Patient evaluated today. 2. Instructed to maintain good pedal hygiene and foot care. Stressed importance of controlling blood sugar.  3. Mechanical debridement of nails 1-5 left performed using a nail nipper. Filed with dremel without incident.  4. Return to clinic in 3 mos.     Edrick Kins, DPM [facility]  Dr. Edrick Kins, [address]                                        [place],  [address]                Office [phone]  Fax [phone]

## 2017-08-12 DIAGNOSIS — N2581 Secondary hyperparathyroidism of renal origin: Secondary | ICD-10-CM | POA: Diagnosis not present

## 2017-08-12 DIAGNOSIS — E1129 Type 2 diabetes mellitus with other diabetic kidney complication: Secondary | ICD-10-CM | POA: Diagnosis not present

## 2017-08-12 DIAGNOSIS — D631 Anemia in chronic kidney disease: Secondary | ICD-10-CM | POA: Diagnosis not present

## 2017-08-12 DIAGNOSIS — N186 End stage renal disease: Secondary | ICD-10-CM | POA: Diagnosis not present

## 2017-08-14 DIAGNOSIS — E1129 Type 2 diabetes mellitus with other diabetic kidney complication: Secondary | ICD-10-CM | POA: Diagnosis not present

## 2017-08-14 DIAGNOSIS — N186 End stage renal disease: Secondary | ICD-10-CM | POA: Diagnosis not present

## 2017-08-14 DIAGNOSIS — N2581 Secondary hyperparathyroidism of renal origin: Secondary | ICD-10-CM | POA: Diagnosis not present

## 2017-08-14 DIAGNOSIS — D631 Anemia in chronic kidney disease: Secondary | ICD-10-CM | POA: Diagnosis not present

## 2017-08-17 DIAGNOSIS — N2581 Secondary hyperparathyroidism of renal origin: Secondary | ICD-10-CM | POA: Diagnosis not present

## 2017-08-17 DIAGNOSIS — N186 End stage renal disease: Secondary | ICD-10-CM | POA: Diagnosis not present

## 2017-08-17 DIAGNOSIS — E1129 Type 2 diabetes mellitus with other diabetic kidney complication: Secondary | ICD-10-CM | POA: Diagnosis not present

## 2017-08-17 DIAGNOSIS — D631 Anemia in chronic kidney disease: Secondary | ICD-10-CM | POA: Diagnosis not present

## 2017-08-18 DIAGNOSIS — I4891 Unspecified atrial fibrillation: Secondary | ICD-10-CM | POA: Diagnosis not present

## 2017-08-18 DIAGNOSIS — Z7901 Long term (current) use of anticoagulants: Secondary | ICD-10-CM | POA: Diagnosis not present

## 2017-08-19 DIAGNOSIS — N2581 Secondary hyperparathyroidism of renal origin: Secondary | ICD-10-CM | POA: Diagnosis not present

## 2017-08-19 DIAGNOSIS — N186 End stage renal disease: Secondary | ICD-10-CM | POA: Diagnosis not present

## 2017-08-19 DIAGNOSIS — D631 Anemia in chronic kidney disease: Secondary | ICD-10-CM | POA: Diagnosis not present

## 2017-08-19 DIAGNOSIS — E1129 Type 2 diabetes mellitus with other diabetic kidney complication: Secondary | ICD-10-CM | POA: Diagnosis not present

## 2017-08-21 DIAGNOSIS — D631 Anemia in chronic kidney disease: Secondary | ICD-10-CM | POA: Diagnosis not present

## 2017-08-21 DIAGNOSIS — N186 End stage renal disease: Secondary | ICD-10-CM | POA: Diagnosis not present

## 2017-08-21 DIAGNOSIS — E1129 Type 2 diabetes mellitus with other diabetic kidney complication: Secondary | ICD-10-CM | POA: Diagnosis not present

## 2017-08-21 DIAGNOSIS — N2581 Secondary hyperparathyroidism of renal origin: Secondary | ICD-10-CM | POA: Diagnosis not present

## 2017-08-24 DIAGNOSIS — E1129 Type 2 diabetes mellitus with other diabetic kidney complication: Secondary | ICD-10-CM | POA: Diagnosis not present

## 2017-08-24 DIAGNOSIS — D631 Anemia in chronic kidney disease: Secondary | ICD-10-CM | POA: Diagnosis not present

## 2017-08-24 DIAGNOSIS — N186 End stage renal disease: Secondary | ICD-10-CM | POA: Diagnosis not present

## 2017-08-24 DIAGNOSIS — N2581 Secondary hyperparathyroidism of renal origin: Secondary | ICD-10-CM | POA: Diagnosis not present

## 2017-08-26 DIAGNOSIS — E1129 Type 2 diabetes mellitus with other diabetic kidney complication: Secondary | ICD-10-CM | POA: Diagnosis not present

## 2017-08-26 DIAGNOSIS — N2581 Secondary hyperparathyroidism of renal origin: Secondary | ICD-10-CM | POA: Diagnosis not present

## 2017-08-26 DIAGNOSIS — N186 End stage renal disease: Secondary | ICD-10-CM | POA: Diagnosis not present

## 2017-08-26 DIAGNOSIS — D631 Anemia in chronic kidney disease: Secondary | ICD-10-CM | POA: Diagnosis not present

## 2017-08-28 DIAGNOSIS — N186 End stage renal disease: Secondary | ICD-10-CM | POA: Diagnosis not present

## 2017-08-28 DIAGNOSIS — E1129 Type 2 diabetes mellitus with other diabetic kidney complication: Secondary | ICD-10-CM | POA: Diagnosis not present

## 2017-08-28 DIAGNOSIS — N2581 Secondary hyperparathyroidism of renal origin: Secondary | ICD-10-CM | POA: Diagnosis not present

## 2017-08-28 DIAGNOSIS — D631 Anemia in chronic kidney disease: Secondary | ICD-10-CM | POA: Diagnosis not present

## 2017-08-30 DIAGNOSIS — N186 End stage renal disease: Secondary | ICD-10-CM | POA: Diagnosis not present

## 2017-08-30 DIAGNOSIS — Z992 Dependence on renal dialysis: Secondary | ICD-10-CM | POA: Diagnosis not present

## 2017-08-30 DIAGNOSIS — E1129 Type 2 diabetes mellitus with other diabetic kidney complication: Secondary | ICD-10-CM | POA: Diagnosis not present

## 2017-08-31 DIAGNOSIS — N2581 Secondary hyperparathyroidism of renal origin: Secondary | ICD-10-CM | POA: Diagnosis not present

## 2017-08-31 DIAGNOSIS — N186 End stage renal disease: Secondary | ICD-10-CM | POA: Diagnosis not present

## 2017-08-31 DIAGNOSIS — D631 Anemia in chronic kidney disease: Secondary | ICD-10-CM | POA: Diagnosis not present

## 2017-08-31 DIAGNOSIS — E1129 Type 2 diabetes mellitus with other diabetic kidney complication: Secondary | ICD-10-CM | POA: Diagnosis not present

## 2017-09-02 DIAGNOSIS — N2581 Secondary hyperparathyroidism of renal origin: Secondary | ICD-10-CM | POA: Diagnosis not present

## 2017-09-02 DIAGNOSIS — E1129 Type 2 diabetes mellitus with other diabetic kidney complication: Secondary | ICD-10-CM | POA: Diagnosis not present

## 2017-09-02 DIAGNOSIS — D631 Anemia in chronic kidney disease: Secondary | ICD-10-CM | POA: Diagnosis not present

## 2017-09-02 DIAGNOSIS — N186 End stage renal disease: Secondary | ICD-10-CM | POA: Diagnosis not present

## 2017-09-04 DIAGNOSIS — D631 Anemia in chronic kidney disease: Secondary | ICD-10-CM | POA: Diagnosis not present

## 2017-09-04 DIAGNOSIS — N186 End stage renal disease: Secondary | ICD-10-CM | POA: Diagnosis not present

## 2017-09-04 DIAGNOSIS — E1129 Type 2 diabetes mellitus with other diabetic kidney complication: Secondary | ICD-10-CM | POA: Diagnosis not present

## 2017-09-04 DIAGNOSIS — N2581 Secondary hyperparathyroidism of renal origin: Secondary | ICD-10-CM | POA: Diagnosis not present

## 2017-09-07 DIAGNOSIS — N186 End stage renal disease: Secondary | ICD-10-CM | POA: Diagnosis not present

## 2017-09-07 DIAGNOSIS — N2581 Secondary hyperparathyroidism of renal origin: Secondary | ICD-10-CM | POA: Diagnosis not present

## 2017-09-07 DIAGNOSIS — E1129 Type 2 diabetes mellitus with other diabetic kidney complication: Secondary | ICD-10-CM | POA: Diagnosis not present

## 2017-09-07 DIAGNOSIS — D631 Anemia in chronic kidney disease: Secondary | ICD-10-CM | POA: Diagnosis not present

## 2017-09-09 DIAGNOSIS — N186 End stage renal disease: Secondary | ICD-10-CM | POA: Diagnosis not present

## 2017-09-09 DIAGNOSIS — N2581 Secondary hyperparathyroidism of renal origin: Secondary | ICD-10-CM | POA: Diagnosis not present

## 2017-09-09 DIAGNOSIS — E1129 Type 2 diabetes mellitus with other diabetic kidney complication: Secondary | ICD-10-CM | POA: Diagnosis not present

## 2017-09-09 DIAGNOSIS — D631 Anemia in chronic kidney disease: Secondary | ICD-10-CM | POA: Diagnosis not present

## 2017-09-11 DIAGNOSIS — D631 Anemia in chronic kidney disease: Secondary | ICD-10-CM | POA: Diagnosis not present

## 2017-09-11 DIAGNOSIS — N186 End stage renal disease: Secondary | ICD-10-CM | POA: Diagnosis not present

## 2017-09-11 DIAGNOSIS — N2581 Secondary hyperparathyroidism of renal origin: Secondary | ICD-10-CM | POA: Diagnosis not present

## 2017-09-11 DIAGNOSIS — E1129 Type 2 diabetes mellitus with other diabetic kidney complication: Secondary | ICD-10-CM | POA: Diagnosis not present

## 2017-09-14 DIAGNOSIS — N186 End stage renal disease: Secondary | ICD-10-CM | POA: Diagnosis not present

## 2017-09-14 DIAGNOSIS — N2581 Secondary hyperparathyroidism of renal origin: Secondary | ICD-10-CM | POA: Diagnosis not present

## 2017-09-14 DIAGNOSIS — E1129 Type 2 diabetes mellitus with other diabetic kidney complication: Secondary | ICD-10-CM | POA: Diagnosis not present

## 2017-09-14 DIAGNOSIS — D631 Anemia in chronic kidney disease: Secondary | ICD-10-CM | POA: Diagnosis not present

## 2017-09-16 DIAGNOSIS — D631 Anemia in chronic kidney disease: Secondary | ICD-10-CM | POA: Diagnosis not present

## 2017-09-16 DIAGNOSIS — N186 End stage renal disease: Secondary | ICD-10-CM | POA: Diagnosis not present

## 2017-09-16 DIAGNOSIS — E1129 Type 2 diabetes mellitus with other diabetic kidney complication: Secondary | ICD-10-CM | POA: Diagnosis not present

## 2017-09-16 DIAGNOSIS — N2581 Secondary hyperparathyroidism of renal origin: Secondary | ICD-10-CM | POA: Diagnosis not present

## 2017-09-18 DIAGNOSIS — D631 Anemia in chronic kidney disease: Secondary | ICD-10-CM | POA: Diagnosis not present

## 2017-09-18 DIAGNOSIS — N186 End stage renal disease: Secondary | ICD-10-CM | POA: Diagnosis not present

## 2017-09-18 DIAGNOSIS — E1129 Type 2 diabetes mellitus with other diabetic kidney complication: Secondary | ICD-10-CM | POA: Diagnosis not present

## 2017-09-18 DIAGNOSIS — N2581 Secondary hyperparathyroidism of renal origin: Secondary | ICD-10-CM | POA: Diagnosis not present

## 2017-09-21 DIAGNOSIS — N186 End stage renal disease: Secondary | ICD-10-CM | POA: Diagnosis not present

## 2017-09-21 DIAGNOSIS — E1129 Type 2 diabetes mellitus with other diabetic kidney complication: Secondary | ICD-10-CM | POA: Diagnosis not present

## 2017-09-21 DIAGNOSIS — N2581 Secondary hyperparathyroidism of renal origin: Secondary | ICD-10-CM | POA: Diagnosis not present

## 2017-09-21 DIAGNOSIS — D631 Anemia in chronic kidney disease: Secondary | ICD-10-CM | POA: Diagnosis not present

## 2017-09-22 DIAGNOSIS — I4891 Unspecified atrial fibrillation: Secondary | ICD-10-CM | POA: Diagnosis not present

## 2017-09-22 DIAGNOSIS — Z7901 Long term (current) use of anticoagulants: Secondary | ICD-10-CM | POA: Diagnosis not present

## 2017-09-23 DIAGNOSIS — N2581 Secondary hyperparathyroidism of renal origin: Secondary | ICD-10-CM | POA: Diagnosis not present

## 2017-09-23 DIAGNOSIS — E1129 Type 2 diabetes mellitus with other diabetic kidney complication: Secondary | ICD-10-CM | POA: Diagnosis not present

## 2017-09-23 DIAGNOSIS — N186 End stage renal disease: Secondary | ICD-10-CM | POA: Diagnosis not present

## 2017-09-23 DIAGNOSIS — D631 Anemia in chronic kidney disease: Secondary | ICD-10-CM | POA: Diagnosis not present

## 2017-09-25 DIAGNOSIS — N186 End stage renal disease: Secondary | ICD-10-CM | POA: Diagnosis not present

## 2017-09-25 DIAGNOSIS — N2581 Secondary hyperparathyroidism of renal origin: Secondary | ICD-10-CM | POA: Diagnosis not present

## 2017-09-25 DIAGNOSIS — D631 Anemia in chronic kidney disease: Secondary | ICD-10-CM | POA: Diagnosis not present

## 2017-09-25 DIAGNOSIS — E1129 Type 2 diabetes mellitus with other diabetic kidney complication: Secondary | ICD-10-CM | POA: Diagnosis not present

## 2017-09-28 DIAGNOSIS — E1129 Type 2 diabetes mellitus with other diabetic kidney complication: Secondary | ICD-10-CM | POA: Diagnosis not present

## 2017-09-28 DIAGNOSIS — D631 Anemia in chronic kidney disease: Secondary | ICD-10-CM | POA: Diagnosis not present

## 2017-09-28 DIAGNOSIS — N186 End stage renal disease: Secondary | ICD-10-CM | POA: Diagnosis not present

## 2017-09-28 DIAGNOSIS — N2581 Secondary hyperparathyroidism of renal origin: Secondary | ICD-10-CM | POA: Diagnosis not present

## 2017-09-29 DIAGNOSIS — E1129 Type 2 diabetes mellitus with other diabetic kidney complication: Secondary | ICD-10-CM | POA: Diagnosis not present

## 2017-09-29 DIAGNOSIS — N186 End stage renal disease: Secondary | ICD-10-CM | POA: Diagnosis not present

## 2017-09-29 DIAGNOSIS — Z992 Dependence on renal dialysis: Secondary | ICD-10-CM | POA: Diagnosis not present

## 2017-09-30 DIAGNOSIS — D631 Anemia in chronic kidney disease: Secondary | ICD-10-CM | POA: Diagnosis not present

## 2017-09-30 DIAGNOSIS — N186 End stage renal disease: Secondary | ICD-10-CM | POA: Diagnosis not present

## 2017-09-30 DIAGNOSIS — E1129 Type 2 diabetes mellitus with other diabetic kidney complication: Secondary | ICD-10-CM | POA: Diagnosis not present

## 2017-09-30 DIAGNOSIS — N2581 Secondary hyperparathyroidism of renal origin: Secondary | ICD-10-CM | POA: Diagnosis not present

## 2017-10-02 DIAGNOSIS — D631 Anemia in chronic kidney disease: Secondary | ICD-10-CM | POA: Diagnosis not present

## 2017-10-02 DIAGNOSIS — N2581 Secondary hyperparathyroidism of renal origin: Secondary | ICD-10-CM | POA: Diagnosis not present

## 2017-10-02 DIAGNOSIS — N186 End stage renal disease: Secondary | ICD-10-CM | POA: Diagnosis not present

## 2017-10-02 DIAGNOSIS — E1129 Type 2 diabetes mellitus with other diabetic kidney complication: Secondary | ICD-10-CM | POA: Diagnosis not present

## 2017-10-05 DIAGNOSIS — D631 Anemia in chronic kidney disease: Secondary | ICD-10-CM | POA: Diagnosis not present

## 2017-10-05 DIAGNOSIS — N2581 Secondary hyperparathyroidism of renal origin: Secondary | ICD-10-CM | POA: Diagnosis not present

## 2017-10-05 DIAGNOSIS — N186 End stage renal disease: Secondary | ICD-10-CM | POA: Diagnosis not present

## 2017-10-05 DIAGNOSIS — E1129 Type 2 diabetes mellitus with other diabetic kidney complication: Secondary | ICD-10-CM | POA: Diagnosis not present

## 2017-10-07 DIAGNOSIS — N2581 Secondary hyperparathyroidism of renal origin: Secondary | ICD-10-CM | POA: Diagnosis not present

## 2017-10-07 DIAGNOSIS — E1129 Type 2 diabetes mellitus with other diabetic kidney complication: Secondary | ICD-10-CM | POA: Diagnosis not present

## 2017-10-07 DIAGNOSIS — D631 Anemia in chronic kidney disease: Secondary | ICD-10-CM | POA: Diagnosis not present

## 2017-10-07 DIAGNOSIS — N186 End stage renal disease: Secondary | ICD-10-CM | POA: Diagnosis not present

## 2017-10-09 DIAGNOSIS — N2581 Secondary hyperparathyroidism of renal origin: Secondary | ICD-10-CM | POA: Diagnosis not present

## 2017-10-09 DIAGNOSIS — D631 Anemia in chronic kidney disease: Secondary | ICD-10-CM | POA: Diagnosis not present

## 2017-10-09 DIAGNOSIS — N186 End stage renal disease: Secondary | ICD-10-CM | POA: Diagnosis not present

## 2017-10-09 DIAGNOSIS — E1129 Type 2 diabetes mellitus with other diabetic kidney complication: Secondary | ICD-10-CM | POA: Diagnosis not present

## 2017-10-12 DIAGNOSIS — E1129 Type 2 diabetes mellitus with other diabetic kidney complication: Secondary | ICD-10-CM | POA: Diagnosis not present

## 2017-10-12 DIAGNOSIS — N2581 Secondary hyperparathyroidism of renal origin: Secondary | ICD-10-CM | POA: Diagnosis not present

## 2017-10-12 DIAGNOSIS — D631 Anemia in chronic kidney disease: Secondary | ICD-10-CM | POA: Diagnosis not present

## 2017-10-12 DIAGNOSIS — N186 End stage renal disease: Secondary | ICD-10-CM | POA: Diagnosis not present

## 2017-10-14 DIAGNOSIS — D631 Anemia in chronic kidney disease: Secondary | ICD-10-CM | POA: Diagnosis not present

## 2017-10-14 DIAGNOSIS — E1129 Type 2 diabetes mellitus with other diabetic kidney complication: Secondary | ICD-10-CM | POA: Diagnosis not present

## 2017-10-14 DIAGNOSIS — N186 End stage renal disease: Secondary | ICD-10-CM | POA: Diagnosis not present

## 2017-10-14 DIAGNOSIS — N2581 Secondary hyperparathyroidism of renal origin: Secondary | ICD-10-CM | POA: Diagnosis not present

## 2017-10-16 DIAGNOSIS — D631 Anemia in chronic kidney disease: Secondary | ICD-10-CM | POA: Diagnosis not present

## 2017-10-16 DIAGNOSIS — N2581 Secondary hyperparathyroidism of renal origin: Secondary | ICD-10-CM | POA: Diagnosis not present

## 2017-10-16 DIAGNOSIS — E1129 Type 2 diabetes mellitus with other diabetic kidney complication: Secondary | ICD-10-CM | POA: Diagnosis not present

## 2017-10-16 DIAGNOSIS — N186 End stage renal disease: Secondary | ICD-10-CM | POA: Diagnosis not present

## 2017-10-19 DIAGNOSIS — D631 Anemia in chronic kidney disease: Secondary | ICD-10-CM | POA: Diagnosis not present

## 2017-10-19 DIAGNOSIS — N186 End stage renal disease: Secondary | ICD-10-CM | POA: Diagnosis not present

## 2017-10-19 DIAGNOSIS — E1129 Type 2 diabetes mellitus with other diabetic kidney complication: Secondary | ICD-10-CM | POA: Diagnosis not present

## 2017-10-19 DIAGNOSIS — N2581 Secondary hyperparathyroidism of renal origin: Secondary | ICD-10-CM | POA: Diagnosis not present

## 2017-10-20 DIAGNOSIS — Z7901 Long term (current) use of anticoagulants: Secondary | ICD-10-CM | POA: Diagnosis not present

## 2017-10-20 DIAGNOSIS — I4891 Unspecified atrial fibrillation: Secondary | ICD-10-CM | POA: Diagnosis not present

## 2017-10-21 DIAGNOSIS — D631 Anemia in chronic kidney disease: Secondary | ICD-10-CM | POA: Diagnosis not present

## 2017-10-21 DIAGNOSIS — E1129 Type 2 diabetes mellitus with other diabetic kidney complication: Secondary | ICD-10-CM | POA: Diagnosis not present

## 2017-10-21 DIAGNOSIS — N186 End stage renal disease: Secondary | ICD-10-CM | POA: Diagnosis not present

## 2017-10-21 DIAGNOSIS — N2581 Secondary hyperparathyroidism of renal origin: Secondary | ICD-10-CM | POA: Diagnosis not present

## 2017-10-23 DIAGNOSIS — N186 End stage renal disease: Secondary | ICD-10-CM | POA: Diagnosis not present

## 2017-10-23 DIAGNOSIS — E1129 Type 2 diabetes mellitus with other diabetic kidney complication: Secondary | ICD-10-CM | POA: Diagnosis not present

## 2017-10-23 DIAGNOSIS — N2581 Secondary hyperparathyroidism of renal origin: Secondary | ICD-10-CM | POA: Diagnosis not present

## 2017-10-23 DIAGNOSIS — D631 Anemia in chronic kidney disease: Secondary | ICD-10-CM | POA: Diagnosis not present

## 2017-10-26 DIAGNOSIS — N186 End stage renal disease: Secondary | ICD-10-CM | POA: Diagnosis not present

## 2017-10-26 DIAGNOSIS — E1129 Type 2 diabetes mellitus with other diabetic kidney complication: Secondary | ICD-10-CM | POA: Diagnosis not present

## 2017-10-26 DIAGNOSIS — N2581 Secondary hyperparathyroidism of renal origin: Secondary | ICD-10-CM | POA: Diagnosis not present

## 2017-10-26 DIAGNOSIS — D631 Anemia in chronic kidney disease: Secondary | ICD-10-CM | POA: Diagnosis not present

## 2017-10-28 DIAGNOSIS — D631 Anemia in chronic kidney disease: Secondary | ICD-10-CM | POA: Diagnosis not present

## 2017-10-28 DIAGNOSIS — N186 End stage renal disease: Secondary | ICD-10-CM | POA: Diagnosis not present

## 2017-10-28 DIAGNOSIS — E1129 Type 2 diabetes mellitus with other diabetic kidney complication: Secondary | ICD-10-CM | POA: Diagnosis not present

## 2017-10-28 DIAGNOSIS — N2581 Secondary hyperparathyroidism of renal origin: Secondary | ICD-10-CM | POA: Diagnosis not present

## 2017-10-30 DIAGNOSIS — N2581 Secondary hyperparathyroidism of renal origin: Secondary | ICD-10-CM | POA: Diagnosis not present

## 2017-10-30 DIAGNOSIS — D631 Anemia in chronic kidney disease: Secondary | ICD-10-CM | POA: Diagnosis not present

## 2017-10-30 DIAGNOSIS — E1129 Type 2 diabetes mellitus with other diabetic kidney complication: Secondary | ICD-10-CM | POA: Diagnosis not present

## 2017-10-30 DIAGNOSIS — Z992 Dependence on renal dialysis: Secondary | ICD-10-CM | POA: Diagnosis not present

## 2017-10-30 DIAGNOSIS — N186 End stage renal disease: Secondary | ICD-10-CM | POA: Diagnosis not present

## 2017-11-02 DIAGNOSIS — N186 End stage renal disease: Secondary | ICD-10-CM | POA: Diagnosis not present

## 2017-11-02 DIAGNOSIS — E1129 Type 2 diabetes mellitus with other diabetic kidney complication: Secondary | ICD-10-CM | POA: Diagnosis not present

## 2017-11-02 DIAGNOSIS — N2581 Secondary hyperparathyroidism of renal origin: Secondary | ICD-10-CM | POA: Diagnosis not present

## 2017-11-02 DIAGNOSIS — D631 Anemia in chronic kidney disease: Secondary | ICD-10-CM | POA: Diagnosis not present

## 2017-11-03 DIAGNOSIS — Z7901 Long term (current) use of anticoagulants: Secondary | ICD-10-CM | POA: Diagnosis not present

## 2017-11-03 DIAGNOSIS — I4891 Unspecified atrial fibrillation: Secondary | ICD-10-CM | POA: Diagnosis not present

## 2017-11-03 IMAGING — CT CT CHEST W/O CM
2 of 4 series · 15 of 36 positions shown, 18 images · non-contrast
Comparison: 03/22/2015

CLINICAL DATA: Non-Hodgkin's lymphoma diagnosed 9881. Recent
carcinoma skin tumors removed from head.

EXAM:
CT CHEST WITHOUT CONTRAST
TECHNIQUE: Multidetector CT imaging of the chest was performed following the
standard protocol without IV contrast.

[Series 2: chest w/o st · axial · non-contrast · 0.90mm/px · z∈[+865,+1203]mm · 12 of 197 slices shown, 15 images]
[im 14/197  mediastinal]
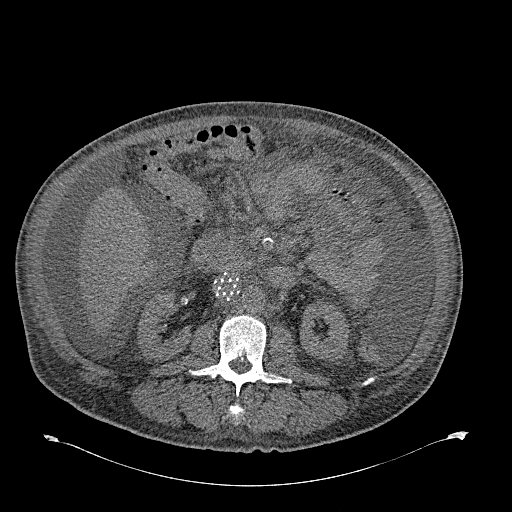
[im 14/197  lung]
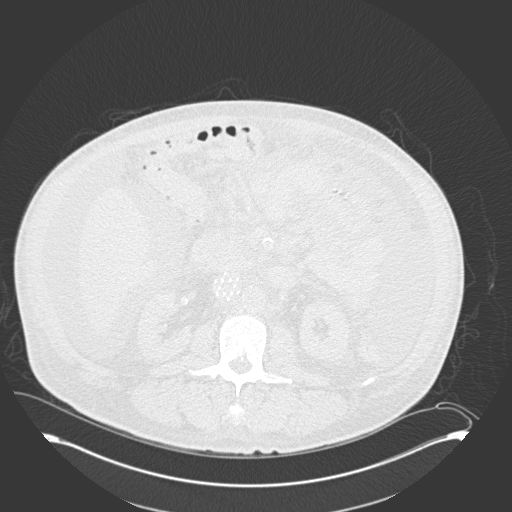
[im 27/197  lung]
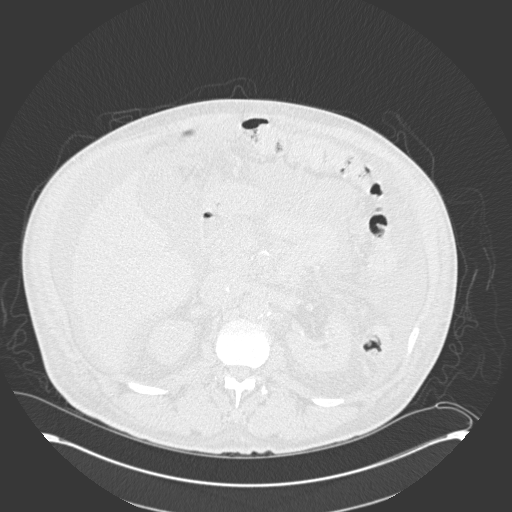
[im 40/197  lung]
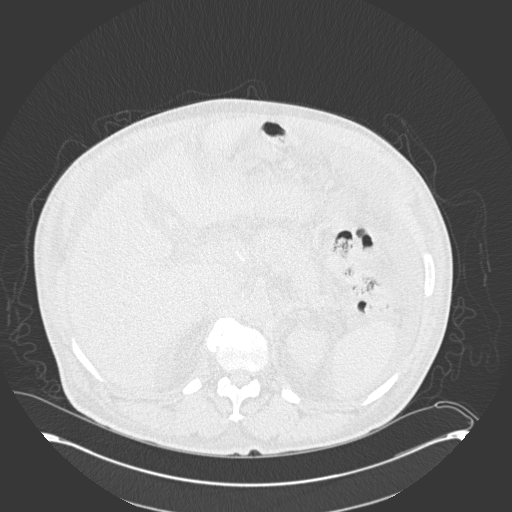
[im 66/197  lung]
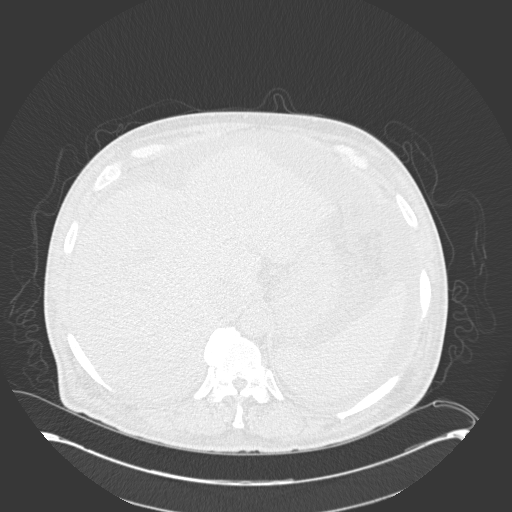
[im 79/197  mediastinal]
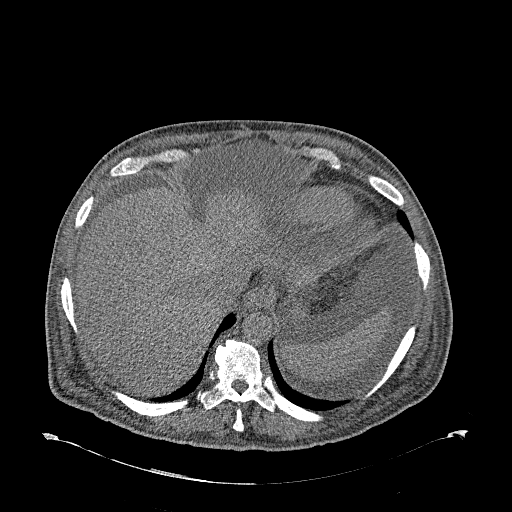
[im 79/197  lung]
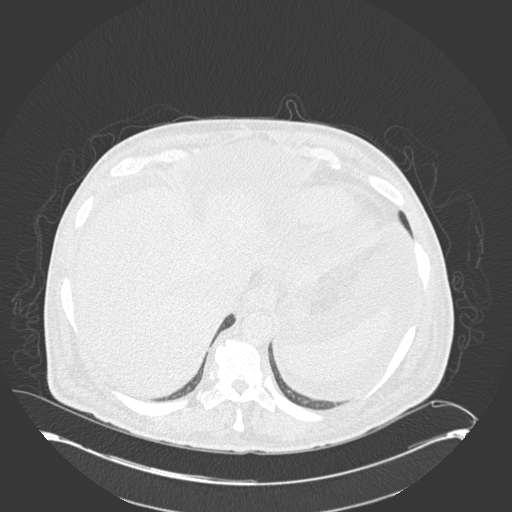
[im 92/197  lung]
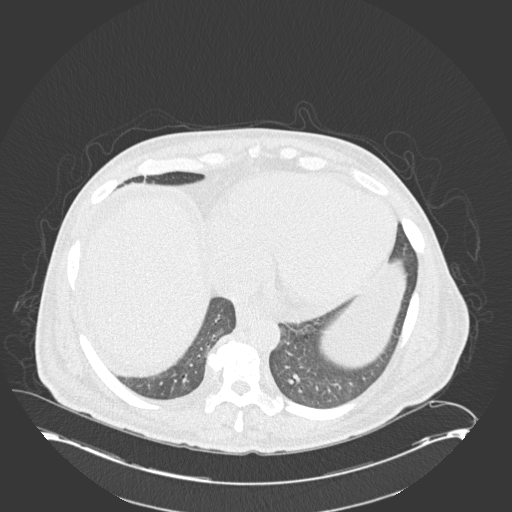
[im 105/197  lung]
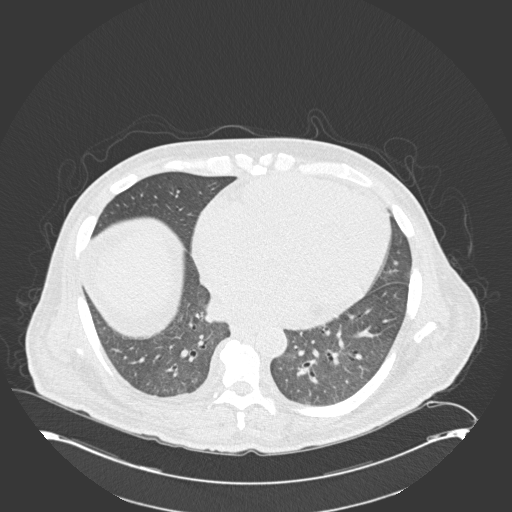
[im 118/197  lung]
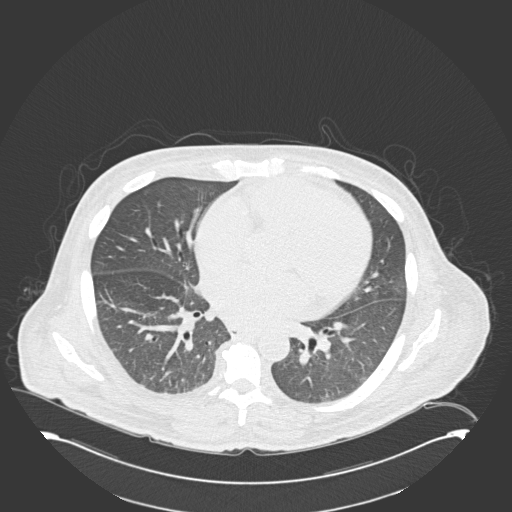
[im 131/197  mediastinal]
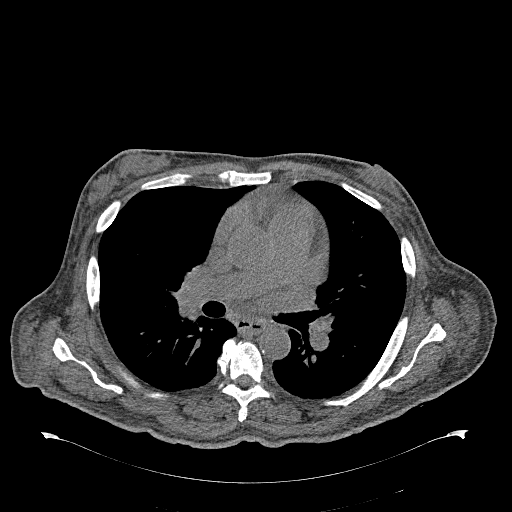
[im 131/197  lung]
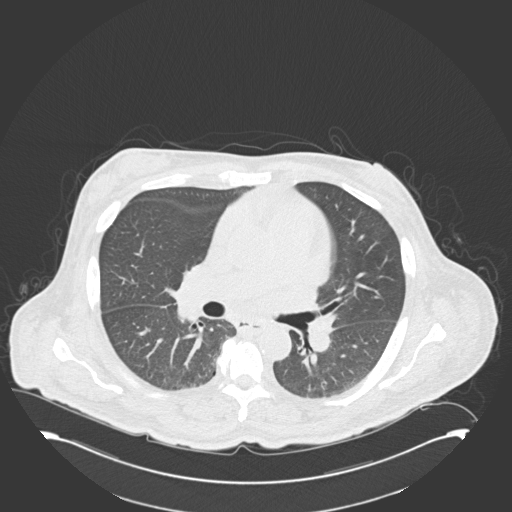
[im 157/197  lung]
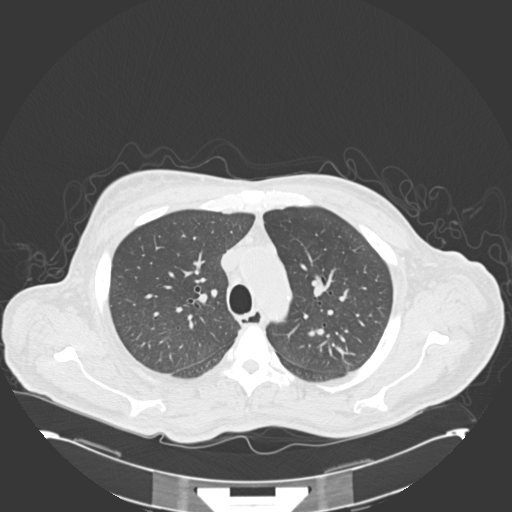
[im 170/197  lung]
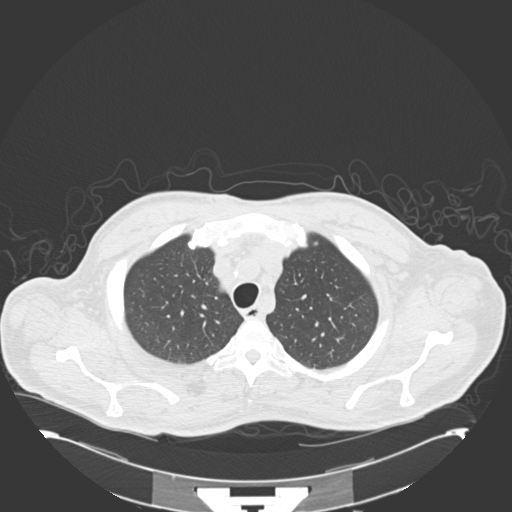
[im 183/197  lung]
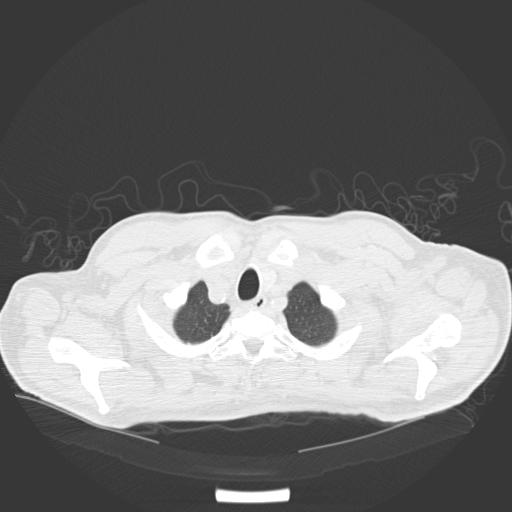

[Series 602: <mpr thick range> · coronal · 0.90mm/px · 3 of 153 slices shown]
[im 31/153  lung]
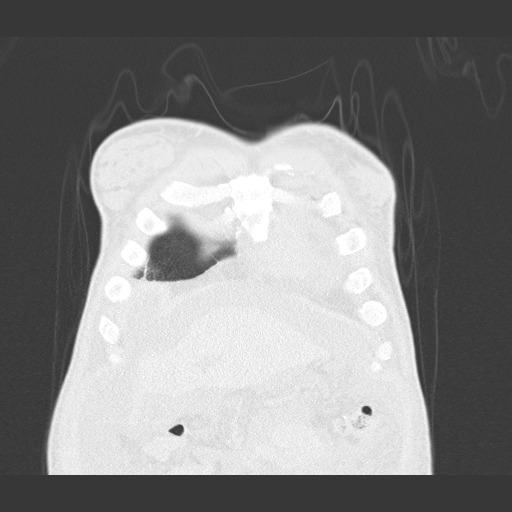
[im 61/153  lung]
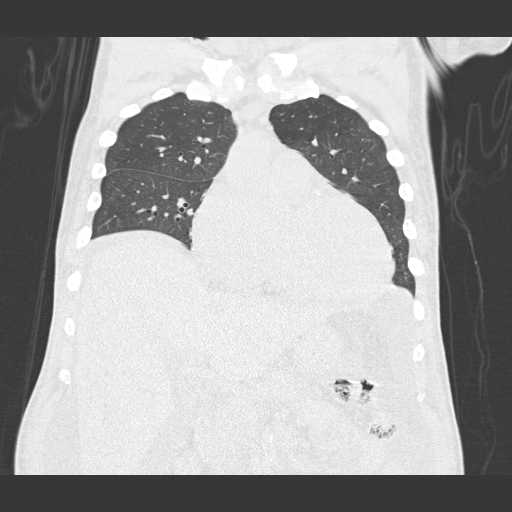
[im 92/153  lung]
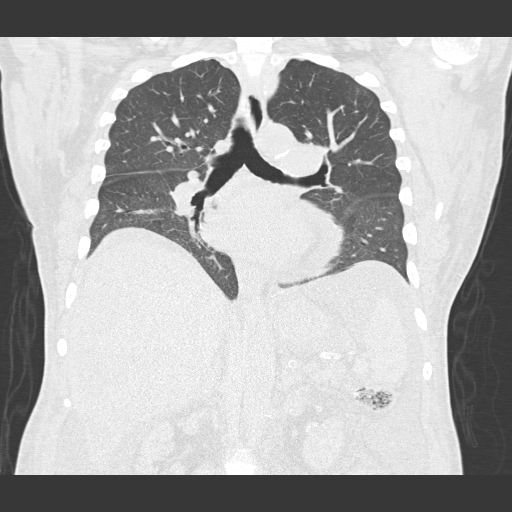

[15 of 36 positions shown; findings below may reference images not displayed]

FINDINGS: Mediastinum/Nodes: Prominent LEFT axillary lymph nodes are again
demonstrated but not pathologic enlarged. Multiple small mediastinal
lymph nodes are present measuring less than 10 mm short axis. Small
pericardial effusion is present. Heart is large.

.

Lungs/Pleura: No suspicious pulmonary nodules. Mild interlobular
septal thickening at lung bases.

Upper abdomen: Limited view of the liver, kidneys, pancreas are
unremarkable. Normal adrenal glands. There is free fluid in the
upper abdomen. IVC filter noted. Large gallstone

Musculoskeletal: There is anasarca within soft tissues chest. No
aggressive osseous lesion.
IMPRESSION: 1. No evidence thoracic metastasis.
2. Prominent LEFT axial lymph node similar to comparison PET-CT
scan.
3. Anasarca, ascites and interstitial edema suggests volume
overload.

## 2017-11-03 IMAGING — CT CT HEAD W/O CM
3 series · 17 of 47 positions shown, 20 images · non-contrast
Comparison: PET-CT 03/22/2015.  Head CT 01/08/2011.

CLINICAL DATA: Headache and dizziness. Skin tumor removed from the
scalp recently. Assess for metastatic disease.

EXAM:
CT HEAD WITHOUT CONTRAST
TECHNIQUE: Contiguous axial images were obtained from the base of the skull
through the vertex without intravenous contrast.

[Series 2: headseq 4.8 h45s · axial · 0.43mm/px · z∈[+1393,+1514]mm · 11 of 30 slices shown, 14 images]
[im 3/30  brain]
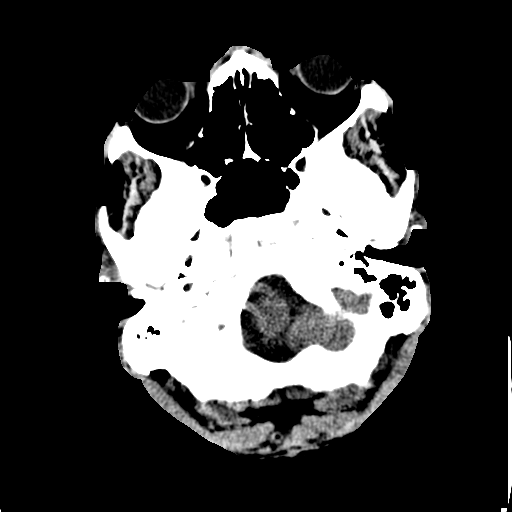
[im 3/30  bone]
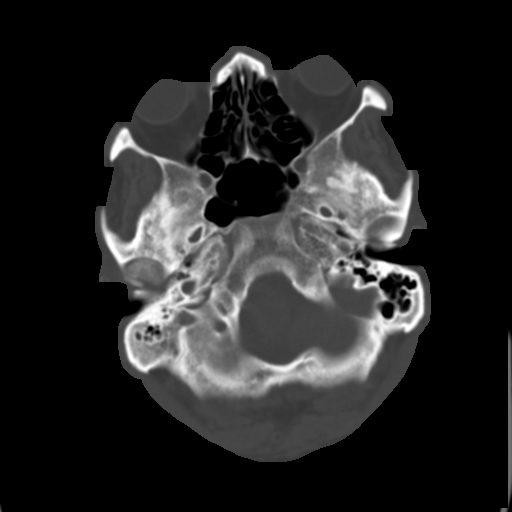
[im 5/30  brain]
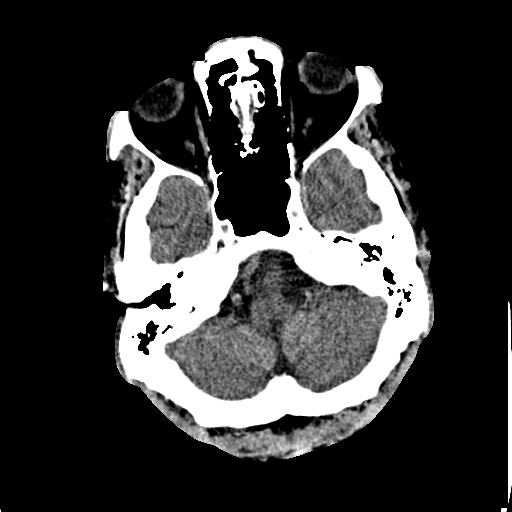
[im 8/30  brain]
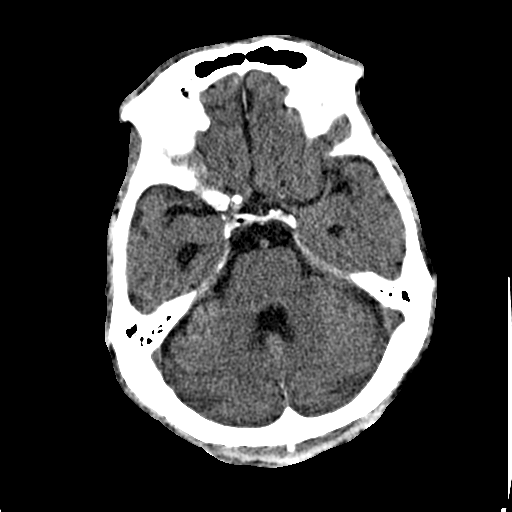
[im 10/30  brain]
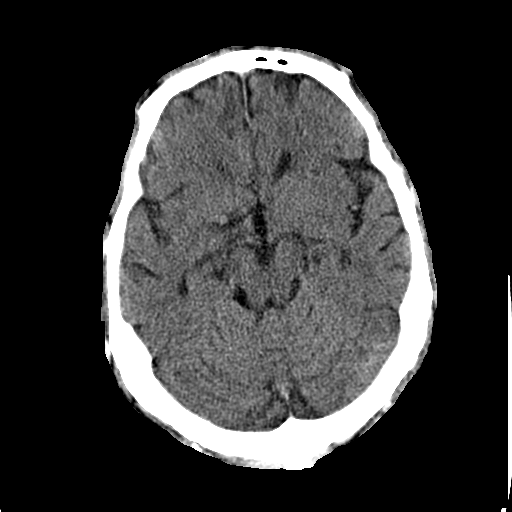
[im 13/30  brain]
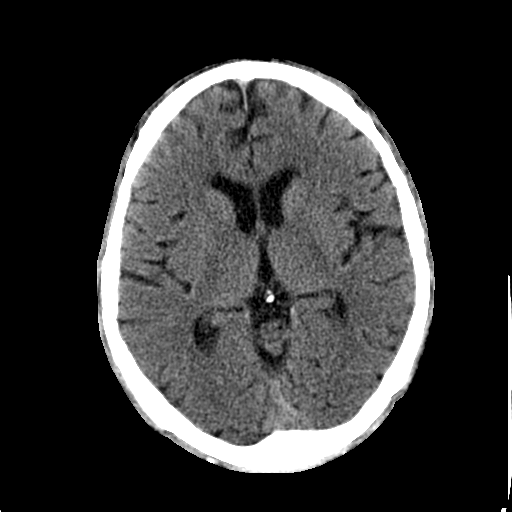
[im 13/30  bone]
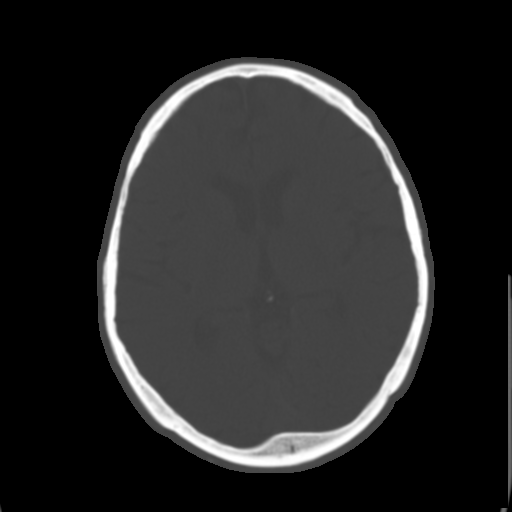
[im 16/30  brain]
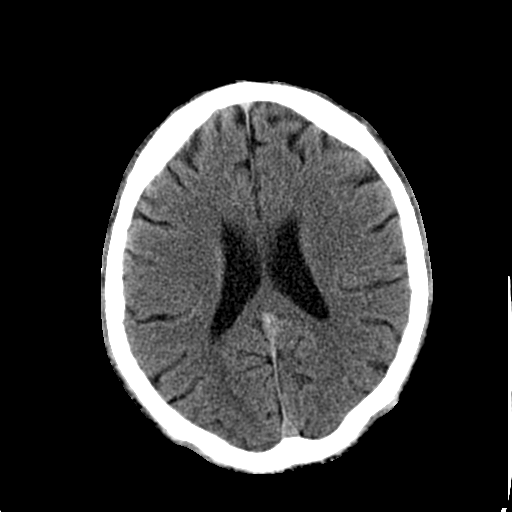
[im 18/30  brain]
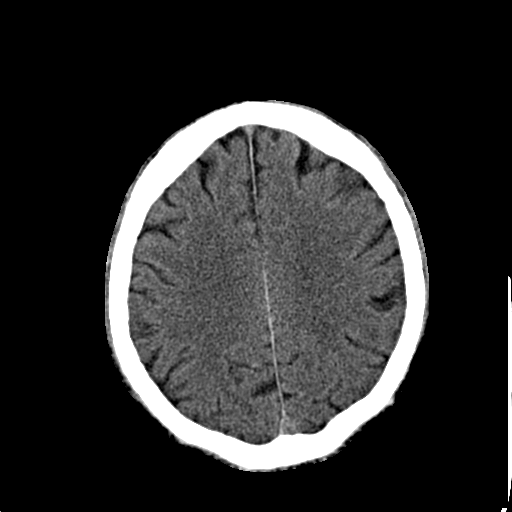
[im 21/30  brain]
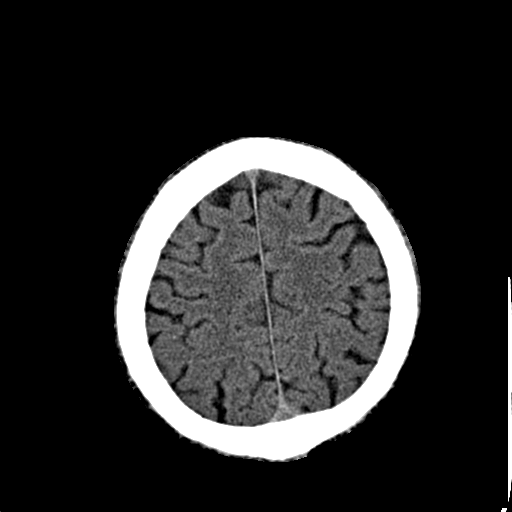
[im 23/30  brain]
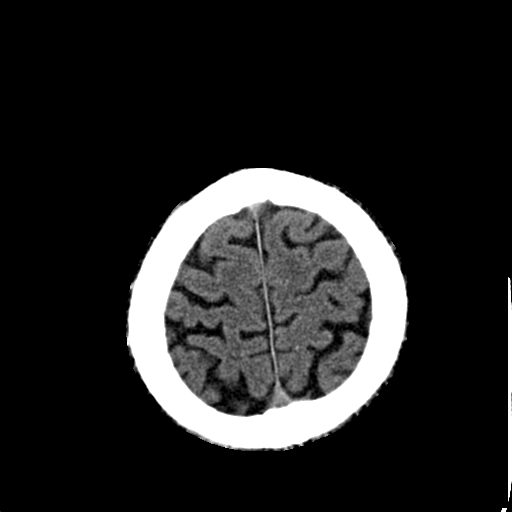
[im 23/30  bone]
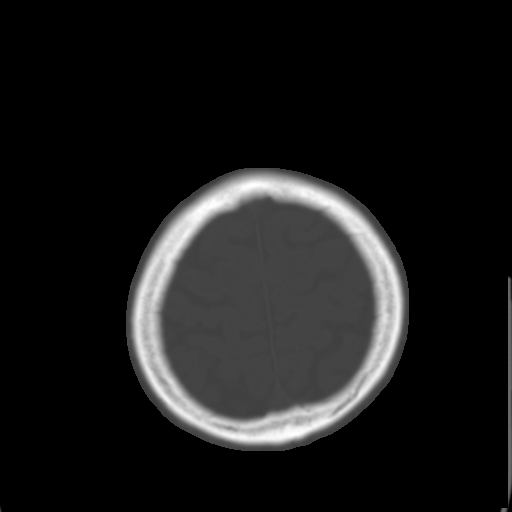
[im 26/30  brain]
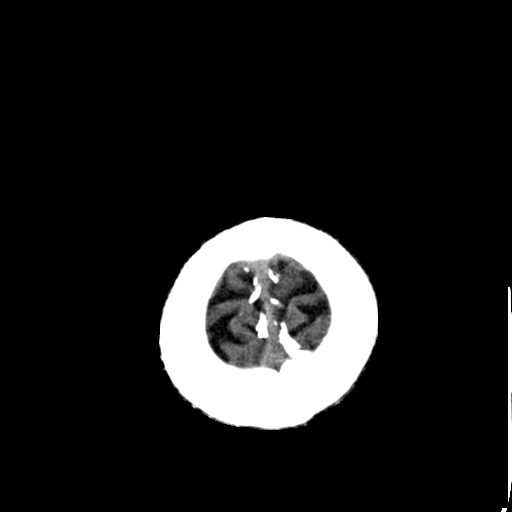
[im 28/30  brain]
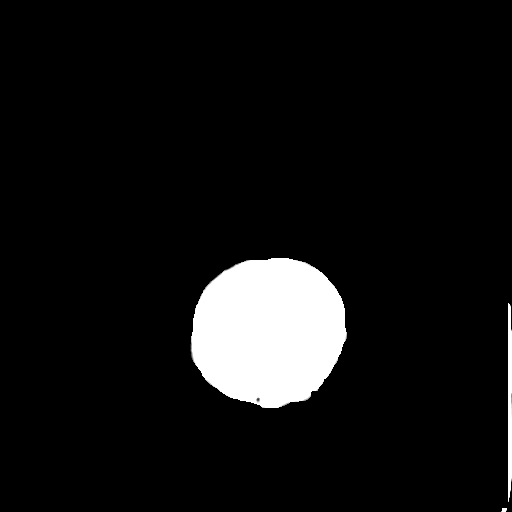

[Series 602: <mpr thick range> · coronal · 0.49mm/px · 3 of 66 slices shown]
[im 22/66  brain]
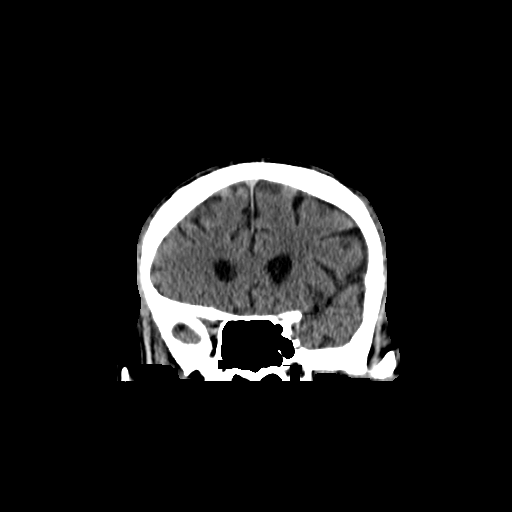
[im 29/66  brain]
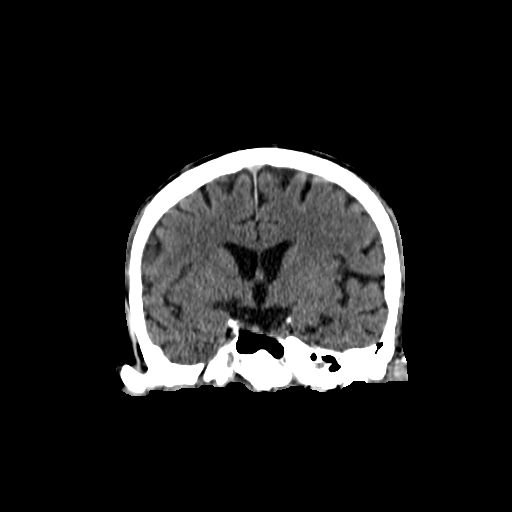
[im 37/66  brain]
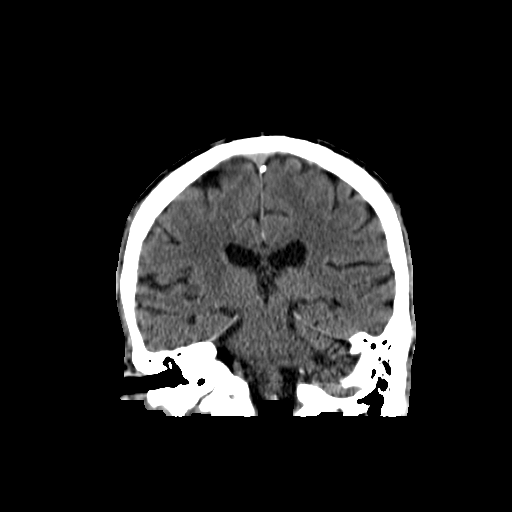

[Series 603: <mpr thick range(1)> · sagittal · 0.49mm/px · 3 of 57 slices shown]
[im 19/57  brain]
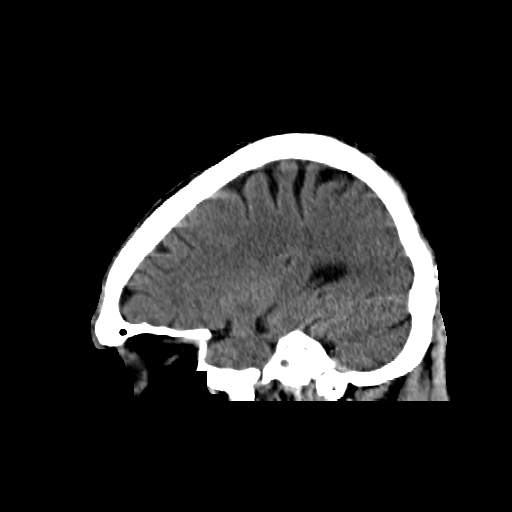
[im 29/57  brain]
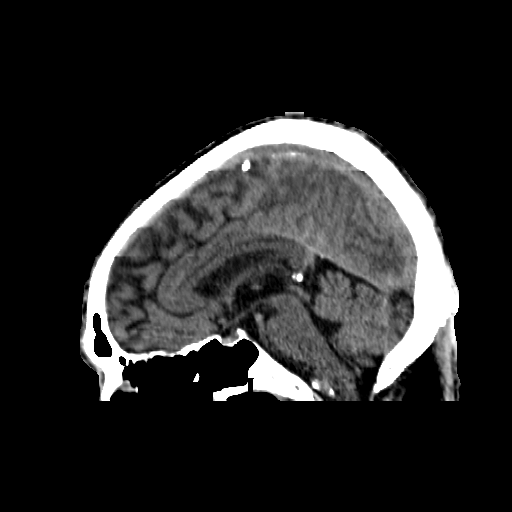
[im 38/57  brain]
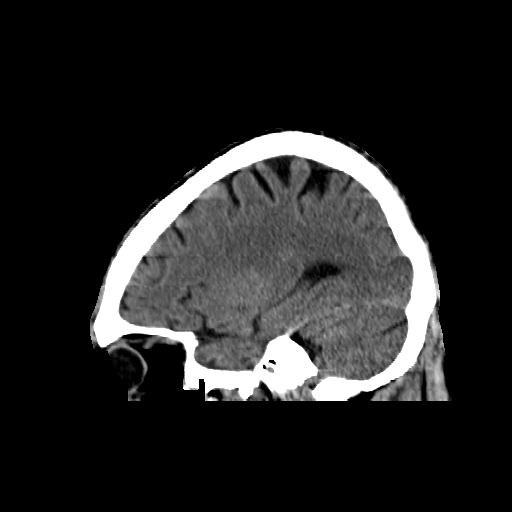

[17 of 47 positions shown; findings below may reference images not displayed]

FINDINGS: The brain does not show any evidence of old or acute small or large
vessel infarction. No evidence of mass lesion, hemorrhage,
hydrocephalus or extra-axial collection. No scalp or calvarial
lesion is seen. There is atherosclerotic calcification of the major
vessels at the base of the brain.
IMPRESSION: Normal appearance of the brain, skull and scalp by CT. There is
atherosclerotic calcification of the major vessels at the base of
the brain.

## 2017-11-04 DIAGNOSIS — E1129 Type 2 diabetes mellitus with other diabetic kidney complication: Secondary | ICD-10-CM | POA: Diagnosis not present

## 2017-11-04 DIAGNOSIS — D631 Anemia in chronic kidney disease: Secondary | ICD-10-CM | POA: Diagnosis not present

## 2017-11-04 DIAGNOSIS — N2581 Secondary hyperparathyroidism of renal origin: Secondary | ICD-10-CM | POA: Diagnosis not present

## 2017-11-04 DIAGNOSIS — N186 End stage renal disease: Secondary | ICD-10-CM | POA: Diagnosis not present

## 2017-11-06 DIAGNOSIS — E1129 Type 2 diabetes mellitus with other diabetic kidney complication: Secondary | ICD-10-CM | POA: Diagnosis not present

## 2017-11-06 DIAGNOSIS — N186 End stage renal disease: Secondary | ICD-10-CM | POA: Diagnosis not present

## 2017-11-06 DIAGNOSIS — N2581 Secondary hyperparathyroidism of renal origin: Secondary | ICD-10-CM | POA: Diagnosis not present

## 2017-11-06 DIAGNOSIS — D631 Anemia in chronic kidney disease: Secondary | ICD-10-CM | POA: Diagnosis not present

## 2017-11-08 ENCOUNTER — Ambulatory Visit (INDEPENDENT_AMBULATORY_CARE_PROVIDER_SITE_OTHER): Payer: Medicare Other | Admitting: Podiatry

## 2017-11-08 DIAGNOSIS — M79676 Pain in unspecified toe(s): Secondary | ICD-10-CM

## 2017-11-08 DIAGNOSIS — Z89431 Acquired absence of right foot: Secondary | ICD-10-CM

## 2017-11-08 DIAGNOSIS — B351 Tinea unguium: Secondary | ICD-10-CM

## 2017-11-09 DIAGNOSIS — N2581 Secondary hyperparathyroidism of renal origin: Secondary | ICD-10-CM | POA: Diagnosis not present

## 2017-11-09 DIAGNOSIS — N186 End stage renal disease: Secondary | ICD-10-CM | POA: Diagnosis not present

## 2017-11-09 DIAGNOSIS — D631 Anemia in chronic kidney disease: Secondary | ICD-10-CM | POA: Diagnosis not present

## 2017-11-09 DIAGNOSIS — E1129 Type 2 diabetes mellitus with other diabetic kidney complication: Secondary | ICD-10-CM | POA: Diagnosis not present

## 2017-11-10 ENCOUNTER — Telehealth: Payer: Self-pay | Admitting: Oncology

## 2017-11-10 DIAGNOSIS — Z6831 Body mass index (BMI) 31.0-31.9, adult: Secondary | ICD-10-CM | POA: Diagnosis not present

## 2017-11-10 DIAGNOSIS — N186 End stage renal disease: Secondary | ICD-10-CM | POA: Diagnosis not present

## 2017-11-10 DIAGNOSIS — C8590 Non-Hodgkin lymphoma, unspecified, unspecified site: Secondary | ICD-10-CM | POA: Diagnosis not present

## 2017-11-10 NOTE — Telephone Encounter (Signed)
Faxed medical records to [facility] at [phone] on 11/10/17, Release ID: [id number]

## 2017-11-11 DIAGNOSIS — N186 End stage renal disease: Secondary | ICD-10-CM | POA: Diagnosis not present

## 2017-11-11 DIAGNOSIS — N2581 Secondary hyperparathyroidism of renal origin: Secondary | ICD-10-CM | POA: Diagnosis not present

## 2017-11-11 DIAGNOSIS — E1129 Type 2 diabetes mellitus with other diabetic kidney complication: Secondary | ICD-10-CM | POA: Diagnosis not present

## 2017-11-11 DIAGNOSIS — D631 Anemia in chronic kidney disease: Secondary | ICD-10-CM | POA: Diagnosis not present

## 2017-11-13 DIAGNOSIS — N186 End stage renal disease: Secondary | ICD-10-CM | POA: Diagnosis not present

## 2017-11-13 DIAGNOSIS — E1129 Type 2 diabetes mellitus with other diabetic kidney complication: Secondary | ICD-10-CM | POA: Diagnosis not present

## 2017-11-13 DIAGNOSIS — N2581 Secondary hyperparathyroidism of renal origin: Secondary | ICD-10-CM | POA: Diagnosis not present

## 2017-11-13 DIAGNOSIS — D631 Anemia in chronic kidney disease: Secondary | ICD-10-CM | POA: Diagnosis not present

## 2017-11-15 NOTE — Progress Notes (Signed)
   SUBJECTIVE 67 year old male presenting today with a chief complaint of elongated, thickened nails that cause pain while wearing shoes. He is unable to trim his own nails. Patient is here for further evaluation and treatment.   Past Medical History:  Diagnosis Date  . Anemia   . Arthritis    HNP- lumbar, "all over my body"  . Blood transfusion    "years ago; blood was low" (08/05/2013)  . CKD (chronic kidney disease) stage 4, GFR 15-29 ml/min (HCC) 03/18/2012   [place]- T,TH,Sat.  . Diabetic nephropathy ([place])   . Diabetic retinopathy   . DVT (deep venous thrombosis) ([place])    "got one in my right leg now; I've had one before too, not sure which leg" (08/05/2013)  . ESRD (end stage renal disease) on dialysis [facility])    "just started today, (08/04/2013)"  . Family history of anesthesia complication    " my son wakes up slowly"  . GERD (gastroesophageal reflux disease)    uses alka seltzere on occas.   [name])    "one q now and then" (08/05/2013)  . Hyperlipidemia   . Hypertension   . 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 Nodular lymphoma of intra-abdominal lymph nodes ([place])   . Non Hodgkin's lymphoma ([place])    Tx 2009; "had chemo; it went away" (08/05/2013)  . Noncompliance 03/16/2012  . NSVT (nonsustained ventricular tachycardia) ([place]) 03/18/2012  . Peripheral vascular disease ([place])   . Pneumonia 2013   hosp.-   . Poor historian    pt. unsure of several answers to health history questions   . Skin cancer    melanoma - head  . Sleep apnea    "suppose to have a sleep study, but they never told me when. (08/05/2013)     [place] Patient is awake, alert, and oriented x 3 and in no acute distress. Derm Skin is dry and supple bilateral. Negative open lesions or macerations. Remaining integument unremarkable. Nails are tender, long, thickened and dystrophic with subungual debris, consistent with onychomycosis, 1-5 left lower  extremity No signs of infection noted. Vasc  DP and PT pedal pulses palpable left. Temperature gradient within normal limits.  Neuro Epicritic and protective threshold sensation diminished left. Musculoskeletal Exam is to return as metatarsal amputation right foot-stable   ASSESSMENT 1. Diabetes Mellitus w/ peripheral neuropathy 2. Onychomycosis of nail due to dermatophyte bilateral 3. Pain in foot bilateral 4. History of TMA right  PLAN OF CARE 1. Patient evaluated today. 2. Instructed to maintain good pedal hygiene and foot care. Stressed importance of controlling blood sugar.  3. Mechanical debridement of nails 1-5 left performed using a nail nipper. Filed with dremel without incident.  4. Return to clinic in 3 mos.     Edrick Kins, DPM [facility]  Dr. Edrick Kins, [address]                                        [place], [place] [address]                Office [phone]  Fax [phone]

## 2017-11-16 DIAGNOSIS — N186 End stage renal disease: Secondary | ICD-10-CM | POA: Diagnosis not present

## 2017-11-16 DIAGNOSIS — E1129 Type 2 diabetes mellitus with other diabetic kidney complication: Secondary | ICD-10-CM | POA: Diagnosis not present

## 2017-11-16 DIAGNOSIS — N2581 Secondary hyperparathyroidism of renal origin: Secondary | ICD-10-CM | POA: Diagnosis not present

## 2017-11-16 DIAGNOSIS — D631 Anemia in chronic kidney disease: Secondary | ICD-10-CM | POA: Diagnosis not present

## 2017-11-18 DIAGNOSIS — E1129 Type 2 diabetes mellitus with other diabetic kidney complication: Secondary | ICD-10-CM | POA: Diagnosis not present

## 2017-11-18 DIAGNOSIS — D631 Anemia in chronic kidney disease: Secondary | ICD-10-CM | POA: Diagnosis not present

## 2017-11-18 DIAGNOSIS — N2581 Secondary hyperparathyroidism of renal origin: Secondary | ICD-10-CM | POA: Diagnosis not present

## 2017-11-18 DIAGNOSIS — N186 End stage renal disease: Secondary | ICD-10-CM | POA: Diagnosis not present

## 2017-11-20 DIAGNOSIS — D631 Anemia in chronic kidney disease: Secondary | ICD-10-CM | POA: Diagnosis not present

## 2017-11-20 DIAGNOSIS — N186 End stage renal disease: Secondary | ICD-10-CM | POA: Diagnosis not present

## 2017-11-20 DIAGNOSIS — E1129 Type 2 diabetes mellitus with other diabetic kidney complication: Secondary | ICD-10-CM | POA: Diagnosis not present

## 2017-11-20 DIAGNOSIS — N2581 Secondary hyperparathyroidism of renal origin: Secondary | ICD-10-CM | POA: Diagnosis not present

## 2017-11-23 DIAGNOSIS — N2581 Secondary hyperparathyroidism of renal origin: Secondary | ICD-10-CM | POA: Diagnosis not present

## 2017-11-23 DIAGNOSIS — E1129 Type 2 diabetes mellitus with other diabetic kidney complication: Secondary | ICD-10-CM | POA: Diagnosis not present

## 2017-11-23 DIAGNOSIS — N186 End stage renal disease: Secondary | ICD-10-CM | POA: Diagnosis not present

## 2017-11-23 DIAGNOSIS — D631 Anemia in chronic kidney disease: Secondary | ICD-10-CM | POA: Diagnosis not present

## 2017-11-25 DIAGNOSIS — D631 Anemia in chronic kidney disease: Secondary | ICD-10-CM | POA: Diagnosis not present

## 2017-11-25 DIAGNOSIS — N2581 Secondary hyperparathyroidism of renal origin: Secondary | ICD-10-CM | POA: Diagnosis not present

## 2017-11-25 DIAGNOSIS — N186 End stage renal disease: Secondary | ICD-10-CM | POA: Diagnosis not present

## 2017-11-25 DIAGNOSIS — E1129 Type 2 diabetes mellitus with other diabetic kidney complication: Secondary | ICD-10-CM | POA: Diagnosis not present

## 2017-11-27 DIAGNOSIS — N186 End stage renal disease: Secondary | ICD-10-CM | POA: Diagnosis not present

## 2017-11-27 DIAGNOSIS — N2581 Secondary hyperparathyroidism of renal origin: Secondary | ICD-10-CM | POA: Diagnosis not present

## 2017-11-27 DIAGNOSIS — E1129 Type 2 diabetes mellitus with other diabetic kidney complication: Secondary | ICD-10-CM | POA: Diagnosis not present

## 2017-11-27 DIAGNOSIS — D631 Anemia in chronic kidney disease: Secondary | ICD-10-CM | POA: Diagnosis not present

## 2017-11-29 DIAGNOSIS — Z992 Dependence on renal dialysis: Secondary | ICD-10-CM | POA: Diagnosis not present

## 2017-11-29 DIAGNOSIS — N186 End stage renal disease: Secondary | ICD-10-CM | POA: Diagnosis not present

## 2017-11-29 DIAGNOSIS — E1129 Type 2 diabetes mellitus with other diabetic kidney complication: Secondary | ICD-10-CM | POA: Diagnosis not present

## 2017-11-30 DIAGNOSIS — N186 End stage renal disease: Secondary | ICD-10-CM | POA: Diagnosis not present

## 2017-11-30 DIAGNOSIS — D631 Anemia in chronic kidney disease: Secondary | ICD-10-CM | POA: Diagnosis not present

## 2017-11-30 DIAGNOSIS — N2581 Secondary hyperparathyroidism of renal origin: Secondary | ICD-10-CM | POA: Diagnosis not present

## 2017-12-01 DIAGNOSIS — Z1283 Encounter for screening for malignant neoplasm of skin: Secondary | ICD-10-CM | POA: Diagnosis not present

## 2017-12-01 DIAGNOSIS — D225 Melanocytic nevi of trunk: Secondary | ICD-10-CM | POA: Diagnosis not present

## 2017-12-02 DIAGNOSIS — D631 Anemia in chronic kidney disease: Secondary | ICD-10-CM | POA: Diagnosis not present

## 2017-12-02 DIAGNOSIS — N2581 Secondary hyperparathyroidism of renal origin: Secondary | ICD-10-CM | POA: Diagnosis not present

## 2017-12-02 DIAGNOSIS — N186 End stage renal disease: Secondary | ICD-10-CM | POA: Diagnosis not present

## 2017-12-04 DIAGNOSIS — N2581 Secondary hyperparathyroidism of renal origin: Secondary | ICD-10-CM | POA: Diagnosis not present

## 2017-12-04 DIAGNOSIS — D631 Anemia in chronic kidney disease: Secondary | ICD-10-CM | POA: Diagnosis not present

## 2017-12-04 DIAGNOSIS — N186 End stage renal disease: Secondary | ICD-10-CM | POA: Diagnosis not present

## 2017-12-07 DIAGNOSIS — N186 End stage renal disease: Secondary | ICD-10-CM | POA: Diagnosis not present

## 2017-12-07 DIAGNOSIS — N2581 Secondary hyperparathyroidism of renal origin: Secondary | ICD-10-CM | POA: Diagnosis not present

## 2017-12-07 DIAGNOSIS — D631 Anemia in chronic kidney disease: Secondary | ICD-10-CM | POA: Diagnosis not present

## 2017-12-09 DIAGNOSIS — N186 End stage renal disease: Secondary | ICD-10-CM | POA: Diagnosis not present

## 2017-12-09 DIAGNOSIS — D631 Anemia in chronic kidney disease: Secondary | ICD-10-CM | POA: Diagnosis not present

## 2017-12-09 DIAGNOSIS — N2581 Secondary hyperparathyroidism of renal origin: Secondary | ICD-10-CM | POA: Diagnosis not present

## 2017-12-11 DIAGNOSIS — N186 End stage renal disease: Secondary | ICD-10-CM | POA: Diagnosis not present

## 2017-12-11 DIAGNOSIS — N2581 Secondary hyperparathyroidism of renal origin: Secondary | ICD-10-CM | POA: Diagnosis not present

## 2017-12-11 DIAGNOSIS — D631 Anemia in chronic kidney disease: Secondary | ICD-10-CM | POA: Diagnosis not present

## 2017-12-14 DIAGNOSIS — N186 End stage renal disease: Secondary | ICD-10-CM | POA: Diagnosis not present

## 2017-12-14 DIAGNOSIS — N2581 Secondary hyperparathyroidism of renal origin: Secondary | ICD-10-CM | POA: Diagnosis not present

## 2017-12-14 DIAGNOSIS — D631 Anemia in chronic kidney disease: Secondary | ICD-10-CM | POA: Diagnosis not present

## 2017-12-15 DIAGNOSIS — Z7901 Long term (current) use of anticoagulants: Secondary | ICD-10-CM | POA: Diagnosis not present

## 2017-12-15 DIAGNOSIS — I4891 Unspecified atrial fibrillation: Secondary | ICD-10-CM | POA: Diagnosis not present

## 2017-12-16 DIAGNOSIS — N186 End stage renal disease: Secondary | ICD-10-CM | POA: Diagnosis not present

## 2017-12-16 DIAGNOSIS — D631 Anemia in chronic kidney disease: Secondary | ICD-10-CM | POA: Diagnosis not present

## 2017-12-16 DIAGNOSIS — N2581 Secondary hyperparathyroidism of renal origin: Secondary | ICD-10-CM | POA: Diagnosis not present

## 2017-12-17 ENCOUNTER — Encounter (INDEPENDENT_AMBULATORY_CARE_PROVIDER_SITE_OTHER): Payer: Medicare Other | Admitting: Ophthalmology

## 2017-12-18 DIAGNOSIS — N2581 Secondary hyperparathyroidism of renal origin: Secondary | ICD-10-CM | POA: Diagnosis not present

## 2017-12-18 DIAGNOSIS — D631 Anemia in chronic kidney disease: Secondary | ICD-10-CM | POA: Diagnosis not present

## 2017-12-18 DIAGNOSIS — N186 End stage renal disease: Secondary | ICD-10-CM | POA: Diagnosis not present

## 2017-12-21 DIAGNOSIS — N186 End stage renal disease: Secondary | ICD-10-CM | POA: Diagnosis not present

## 2017-12-21 DIAGNOSIS — N2581 Secondary hyperparathyroidism of renal origin: Secondary | ICD-10-CM | POA: Diagnosis not present

## 2017-12-21 DIAGNOSIS — D631 Anemia in chronic kidney disease: Secondary | ICD-10-CM | POA: Diagnosis not present

## 2017-12-22 ENCOUNTER — Encounter (INDEPENDENT_AMBULATORY_CARE_PROVIDER_SITE_OTHER): Payer: Medicare Other | Admitting: Ophthalmology

## 2017-12-22 DIAGNOSIS — E113593 Type 2 diabetes mellitus with proliferative diabetic retinopathy without macular edema, bilateral: Secondary | ICD-10-CM | POA: Diagnosis not present

## 2017-12-22 DIAGNOSIS — I10 Essential (primary) hypertension: Secondary | ICD-10-CM

## 2017-12-22 DIAGNOSIS — H35033 Hypertensive retinopathy, bilateral: Secondary | ICD-10-CM

## 2017-12-22 DIAGNOSIS — E11319 Type 2 diabetes mellitus with unspecified diabetic retinopathy without macular edema: Secondary | ICD-10-CM

## 2017-12-23 DIAGNOSIS — N186 End stage renal disease: Secondary | ICD-10-CM | POA: Diagnosis not present

## 2017-12-23 DIAGNOSIS — N2581 Secondary hyperparathyroidism of renal origin: Secondary | ICD-10-CM | POA: Diagnosis not present

## 2017-12-23 DIAGNOSIS — D631 Anemia in chronic kidney disease: Secondary | ICD-10-CM | POA: Diagnosis not present

## 2017-12-25 DIAGNOSIS — D631 Anemia in chronic kidney disease: Secondary | ICD-10-CM | POA: Diagnosis not present

## 2017-12-25 DIAGNOSIS — N186 End stage renal disease: Secondary | ICD-10-CM | POA: Diagnosis not present

## 2017-12-25 DIAGNOSIS — N2581 Secondary hyperparathyroidism of renal origin: Secondary | ICD-10-CM | POA: Diagnosis not present

## 2017-12-28 DIAGNOSIS — N2581 Secondary hyperparathyroidism of renal origin: Secondary | ICD-10-CM | POA: Diagnosis not present

## 2017-12-28 DIAGNOSIS — N186 End stage renal disease: Secondary | ICD-10-CM | POA: Diagnosis not present

## 2017-12-28 DIAGNOSIS — D631 Anemia in chronic kidney disease: Secondary | ICD-10-CM | POA: Diagnosis not present

## 2017-12-30 DIAGNOSIS — E1129 Type 2 diabetes mellitus with other diabetic kidney complication: Secondary | ICD-10-CM | POA: Diagnosis not present

## 2017-12-30 DIAGNOSIS — N2581 Secondary hyperparathyroidism of renal origin: Secondary | ICD-10-CM | POA: Diagnosis not present

## 2017-12-30 DIAGNOSIS — N186 End stage renal disease: Secondary | ICD-10-CM | POA: Diagnosis not present

## 2017-12-30 DIAGNOSIS — Z992 Dependence on renal dialysis: Secondary | ICD-10-CM | POA: Diagnosis not present

## 2017-12-30 DIAGNOSIS — D631 Anemia in chronic kidney disease: Secondary | ICD-10-CM | POA: Diagnosis not present

## 2018-01-01 DIAGNOSIS — E1129 Type 2 diabetes mellitus with other diabetic kidney complication: Secondary | ICD-10-CM | POA: Diagnosis not present

## 2018-01-01 DIAGNOSIS — D631 Anemia in chronic kidney disease: Secondary | ICD-10-CM | POA: Diagnosis not present

## 2018-01-01 DIAGNOSIS — N186 End stage renal disease: Secondary | ICD-10-CM | POA: Diagnosis not present

## 2018-01-01 DIAGNOSIS — N2581 Secondary hyperparathyroidism of renal origin: Secondary | ICD-10-CM | POA: Diagnosis not present

## 2018-01-04 DIAGNOSIS — N2581 Secondary hyperparathyroidism of renal origin: Secondary | ICD-10-CM | POA: Diagnosis not present

## 2018-01-04 DIAGNOSIS — E1129 Type 2 diabetes mellitus with other diabetic kidney complication: Secondary | ICD-10-CM | POA: Diagnosis not present

## 2018-01-04 DIAGNOSIS — N186 End stage renal disease: Secondary | ICD-10-CM | POA: Diagnosis not present

## 2018-01-04 DIAGNOSIS — D631 Anemia in chronic kidney disease: Secondary | ICD-10-CM | POA: Diagnosis not present

## 2018-01-05 DIAGNOSIS — I4891 Unspecified atrial fibrillation: Secondary | ICD-10-CM | POA: Diagnosis not present

## 2018-01-05 DIAGNOSIS — Z7901 Long term (current) use of anticoagulants: Secondary | ICD-10-CM | POA: Diagnosis not present

## 2018-01-06 DIAGNOSIS — N2581 Secondary hyperparathyroidism of renal origin: Secondary | ICD-10-CM | POA: Diagnosis not present

## 2018-01-06 DIAGNOSIS — E1129 Type 2 diabetes mellitus with other diabetic kidney complication: Secondary | ICD-10-CM | POA: Diagnosis not present

## 2018-01-06 DIAGNOSIS — N186 End stage renal disease: Secondary | ICD-10-CM | POA: Diagnosis not present

## 2018-01-06 DIAGNOSIS — D631 Anemia in chronic kidney disease: Secondary | ICD-10-CM | POA: Diagnosis not present

## 2018-01-08 DIAGNOSIS — D631 Anemia in chronic kidney disease: Secondary | ICD-10-CM | POA: Diagnosis not present

## 2018-01-08 DIAGNOSIS — N2581 Secondary hyperparathyroidism of renal origin: Secondary | ICD-10-CM | POA: Diagnosis not present

## 2018-01-08 DIAGNOSIS — E1129 Type 2 diabetes mellitus with other diabetic kidney complication: Secondary | ICD-10-CM | POA: Diagnosis not present

## 2018-01-08 DIAGNOSIS — N186 End stage renal disease: Secondary | ICD-10-CM | POA: Diagnosis not present

## 2018-01-09 ENCOUNTER — Inpatient Hospital Stay (HOSPITAL_COMMUNITY): Payer: Medicare Other

## 2018-01-09 ENCOUNTER — Other Ambulatory Visit: Payer: Self-pay

## 2018-01-09 ENCOUNTER — Inpatient Hospital Stay (HOSPITAL_COMMUNITY)
Admission: EM | Admit: 2018-01-09 | Discharge: 2018-01-21 | DRG: 853 | Disposition: A | Discharge status: Skilled Nursing Facility | Payer: Medicare Other | Attending: Internal Medicine | Admitting: Internal Medicine

## 2018-01-09 ENCOUNTER — Encounter (HOSPITAL_COMMUNITY): Payer: Self-pay

## 2018-01-09 ENCOUNTER — Emergency Department (HOSPITAL_COMMUNITY): Payer: Medicare Other

## 2018-01-09 DIAGNOSIS — I959 Hypotension, unspecified: Secondary | ICD-10-CM | POA: Diagnosis not present

## 2018-01-09 DIAGNOSIS — T8242XA Displacement of vascular dialysis catheter, initial encounter: Secondary | ICD-10-CM | POA: Diagnosis not present

## 2018-01-09 DIAGNOSIS — Z79899 Other long term (current) drug therapy: Secondary | ICD-10-CM

## 2018-01-09 DIAGNOSIS — K8020 Calculus of gallbladder without cholecystitis without obstruction: Secondary | ICD-10-CM | POA: Diagnosis not present

## 2018-01-09 DIAGNOSIS — E872 Acidosis, unspecified: Secondary | ICD-10-CM

## 2018-01-09 DIAGNOSIS — R0902 Hypoxemia: Secondary | ICD-10-CM | POA: Diagnosis not present

## 2018-01-09 DIAGNOSIS — G9341 Metabolic encephalopathy: Secondary | ICD-10-CM | POA: Diagnosis present

## 2018-01-09 DIAGNOSIS — R579 Shock, unspecified: Secondary | ICD-10-CM | POA: Diagnosis not present

## 2018-01-09 DIAGNOSIS — I472 Ventricular tachycardia: Secondary | ICD-10-CM | POA: Diagnosis not present

## 2018-01-09 DIAGNOSIS — G92 Toxic encephalopathy: Secondary | ICD-10-CM

## 2018-01-09 DIAGNOSIS — Z48812 Encounter for surgical aftercare following surgery on the circulatory system: Secondary | ICD-10-CM | POA: Diagnosis not present

## 2018-01-09 DIAGNOSIS — I5042 Chronic combined systolic (congestive) and diastolic (congestive) heart failure: Secondary | ICD-10-CM | POA: Diagnosis not present

## 2018-01-09 DIAGNOSIS — N179 Acute kidney failure, unspecified: Secondary | ICD-10-CM | POA: Diagnosis not present

## 2018-01-09 DIAGNOSIS — J9690 Respiratory failure, unspecified, unspecified whether with hypoxia or hypercapnia: Secondary | ICD-10-CM | POA: Diagnosis not present

## 2018-01-09 DIAGNOSIS — D62 Acute posthemorrhagic anemia: Secondary | ICD-10-CM | POA: Diagnosis not present

## 2018-01-09 DIAGNOSIS — K8010 Calculus of gallbladder with chronic cholecystitis without obstruction: Secondary | ICD-10-CM | POA: Diagnosis present

## 2018-01-09 DIAGNOSIS — T8241XD Breakdown (mechanical) of vascular dialysis catheter, subsequent encounter: Secondary | ICD-10-CM

## 2018-01-09 DIAGNOSIS — R7989 Other specified abnormal findings of blood chemistry: Secondary | ICD-10-CM

## 2018-01-09 DIAGNOSIS — R570 Cardiogenic shock: Secondary | ICD-10-CM | POA: Diagnosis present

## 2018-01-09 DIAGNOSIS — Z86718 Personal history of other venous thrombosis and embolism: Secondary | ICD-10-CM

## 2018-01-09 DIAGNOSIS — Z4659 Encounter for fitting and adjustment of other gastrointestinal appliance and device: Secondary | ICD-10-CM | POA: Diagnosis not present

## 2018-01-09 DIAGNOSIS — K7200 Acute and subacute hepatic failure without coma: Secondary | ICD-10-CM | POA: Diagnosis present

## 2018-01-09 DIAGNOSIS — R000 Tachycardia, unspecified: Secondary | ICD-10-CM | POA: Diagnosis not present

## 2018-01-09 DIAGNOSIS — Y95 Nosocomial condition: Secondary | ICD-10-CM | POA: Diagnosis present

## 2018-01-09 DIAGNOSIS — I319 Disease of pericardium, unspecified: Secondary | ICD-10-CM | POA: Diagnosis not present

## 2018-01-09 DIAGNOSIS — I5022 Chronic systolic (congestive) heart failure: Secondary | ICD-10-CM | POA: Diagnosis not present

## 2018-01-09 DIAGNOSIS — I132 Hypertensive heart and chronic kidney disease with heart failure and with stage 5 chronic kidney disease, or end stage renal disease: Secondary | ICD-10-CM | POA: Diagnosis present

## 2018-01-09 DIAGNOSIS — E1122 Type 2 diabetes mellitus with diabetic chronic kidney disease: Secondary | ICD-10-CM | POA: Diagnosis present

## 2018-01-09 DIAGNOSIS — N2581 Secondary hyperparathyroidism of renal origin: Secondary | ICD-10-CM | POA: Diagnosis present

## 2018-01-09 DIAGNOSIS — E785 Hyperlipidemia, unspecified: Secondary | ICD-10-CM | POA: Diagnosis present

## 2018-01-09 DIAGNOSIS — J189 Pneumonia, unspecified organism: Secondary | ICD-10-CM | POA: Diagnosis not present

## 2018-01-09 DIAGNOSIS — I313 Pericardial effusion (noninflammatory): Secondary | ICD-10-CM | POA: Diagnosis present

## 2018-01-09 DIAGNOSIS — Z978 Presence of other specified devices: Secondary | ICD-10-CM

## 2018-01-09 DIAGNOSIS — E440 Moderate protein-calorie malnutrition: Secondary | ICD-10-CM | POA: Diagnosis not present

## 2018-01-09 DIAGNOSIS — E875 Hyperkalemia: Secondary | ICD-10-CM | POA: Diagnosis not present

## 2018-01-09 DIAGNOSIS — I482 Chronic atrial fibrillation: Secondary | ICD-10-CM | POA: Diagnosis not present

## 2018-01-09 DIAGNOSIS — D689 Coagulation defect, unspecified: Secondary | ICD-10-CM | POA: Diagnosis not present

## 2018-01-09 DIAGNOSIS — I255 Ischemic cardiomyopathy: Secondary | ICD-10-CM | POA: Diagnosis present

## 2018-01-09 DIAGNOSIS — K8500 Idiopathic acute pancreatitis without necrosis or infection: Secondary | ICD-10-CM | POA: Diagnosis present

## 2018-01-09 DIAGNOSIS — R1312 Dysphagia, oropharyngeal phase: Secondary | ICD-10-CM | POA: Diagnosis not present

## 2018-01-09 DIAGNOSIS — Z781 Physical restraint status: Secondary | ICD-10-CM

## 2018-01-09 DIAGNOSIS — E861 Hypovolemia: Secondary | ICD-10-CM | POA: Diagnosis present

## 2018-01-09 DIAGNOSIS — K819 Cholecystitis, unspecified: Secondary | ICD-10-CM | POA: Diagnosis not present

## 2018-01-09 DIAGNOSIS — Z4682 Encounter for fitting and adjustment of non-vascular catheter: Secondary | ICD-10-CM | POA: Diagnosis not present

## 2018-01-09 DIAGNOSIS — Z7901 Long term (current) use of anticoagulants: Secondary | ICD-10-CM

## 2018-01-09 DIAGNOSIS — M1990 Unspecified osteoarthritis, unspecified site: Secondary | ICD-10-CM | POA: Diagnosis present

## 2018-01-09 DIAGNOSIS — Z9289 Personal history of other medical treatment: Secondary | ICD-10-CM

## 2018-01-09 DIAGNOSIS — J9601 Acute respiratory failure with hypoxia: Secondary | ICD-10-CM

## 2018-01-09 DIAGNOSIS — I481 Persistent atrial fibrillation: Secondary | ICD-10-CM | POA: Diagnosis not present

## 2018-01-09 DIAGNOSIS — Z8249 Family history of ischemic heart disease and other diseases of the circulatory system: Secondary | ICD-10-CM

## 2018-01-09 DIAGNOSIS — J810 Acute pulmonary edema: Secondary | ICD-10-CM | POA: Diagnosis not present

## 2018-01-09 DIAGNOSIS — I314 Cardiac tamponade: Secondary | ICD-10-CM | POA: Diagnosis present

## 2018-01-09 DIAGNOSIS — E1151 Type 2 diabetes mellitus with diabetic peripheral angiopathy without gangrene: Secondary | ICD-10-CM | POA: Diagnosis present

## 2018-01-09 DIAGNOSIS — A419 Sepsis, unspecified organism: Secondary | ICD-10-CM | POA: Diagnosis not present

## 2018-01-09 DIAGNOSIS — I4510 Unspecified right bundle-branch block: Secondary | ICD-10-CM | POA: Diagnosis not present

## 2018-01-09 DIAGNOSIS — Z923 Personal history of irradiation: Secondary | ICD-10-CM

## 2018-01-09 DIAGNOSIS — I4892 Unspecified atrial flutter: Secondary | ICD-10-CM | POA: Diagnosis not present

## 2018-01-09 DIAGNOSIS — R1084 Generalized abdominal pain: Secondary | ICD-10-CM

## 2018-01-09 DIAGNOSIS — J811 Chronic pulmonary edema: Secondary | ICD-10-CM

## 2018-01-09 DIAGNOSIS — I120 Hypertensive chronic kidney disease with stage 5 chronic kidney disease or end stage renal disease: Secondary | ICD-10-CM | POA: Diagnosis not present

## 2018-01-09 DIAGNOSIS — J181 Lobar pneumonia, unspecified organism: Secondary | ICD-10-CM | POA: Diagnosis present

## 2018-01-09 DIAGNOSIS — Z794 Long term (current) use of insulin: Secondary | ICD-10-CM

## 2018-01-09 DIAGNOSIS — I480 Paroxysmal atrial fibrillation: Secondary | ICD-10-CM | POA: Diagnosis present

## 2018-01-09 DIAGNOSIS — I308 Other forms of acute pericarditis: Secondary | ICD-10-CM | POA: Diagnosis not present

## 2018-01-09 DIAGNOSIS — J90 Pleural effusion, not elsewhere classified: Secondary | ICD-10-CM | POA: Diagnosis not present

## 2018-01-09 DIAGNOSIS — J9811 Atelectasis: Secondary | ICD-10-CM | POA: Diagnosis not present

## 2018-01-09 DIAGNOSIS — I483 Typical atrial flutter: Secondary | ICD-10-CM | POA: Diagnosis not present

## 2018-01-09 DIAGNOSIS — R279 Unspecified lack of coordination: Secondary | ICD-10-CM | POA: Diagnosis not present

## 2018-01-09 DIAGNOSIS — Z992 Dependence on renal dialysis: Secondary | ICD-10-CM | POA: Diagnosis not present

## 2018-01-09 DIAGNOSIS — Z452 Encounter for adjustment and management of vascular access device: Secondary | ICD-10-CM

## 2018-01-09 DIAGNOSIS — Z833 Family history of diabetes mellitus: Secondary | ICD-10-CM

## 2018-01-09 DIAGNOSIS — Z8572 Personal history of non-Hodgkin lymphomas: Secondary | ICD-10-CM

## 2018-01-09 DIAGNOSIS — Z89431 Acquired absence of right foot: Secondary | ICD-10-CM

## 2018-01-09 DIAGNOSIS — T8189XA Other complications of procedures, not elsewhere classified, initial encounter: Secondary | ICD-10-CM

## 2018-01-09 DIAGNOSIS — R109 Unspecified abdominal pain: Secondary | ICD-10-CM | POA: Diagnosis not present

## 2018-01-09 DIAGNOSIS — R5381 Other malaise: Secondary | ICD-10-CM | POA: Diagnosis not present

## 2018-01-09 DIAGNOSIS — N186 End stage renal disease: Secondary | ICD-10-CM | POA: Diagnosis present

## 2018-01-09 DIAGNOSIS — I428 Other cardiomyopathies: Secondary | ICD-10-CM | POA: Diagnosis present

## 2018-01-09 DIAGNOSIS — R945 Abnormal results of liver function studies: Secondary | ICD-10-CM

## 2018-01-09 DIAGNOSIS — K219 Gastro-esophageal reflux disease without esophagitis: Secondary | ICD-10-CM | POA: Diagnosis present

## 2018-01-09 DIAGNOSIS — M6281 Muscle weakness (generalized): Secondary | ICD-10-CM | POA: Diagnosis not present

## 2018-01-09 DIAGNOSIS — R4189 Other symptoms and signs involving cognitive functions and awareness: Secondary | ICD-10-CM | POA: Diagnosis not present

## 2018-01-09 DIAGNOSIS — Z09 Encounter for follow-up examination after completed treatment for conditions other than malignant neoplasm: Secondary | ICD-10-CM

## 2018-01-09 DIAGNOSIS — I9589 Other hypotension: Secondary | ICD-10-CM | POA: Diagnosis not present

## 2018-01-09 DIAGNOSIS — R6521 Severe sepsis with septic shock: Secondary | ICD-10-CM | POA: Diagnosis present

## 2018-01-09 DIAGNOSIS — Z743 Need for continuous supervision: Secondary | ICD-10-CM | POA: Diagnosis not present

## 2018-01-09 DIAGNOSIS — E669 Obesity, unspecified: Secondary | ICD-10-CM | POA: Diagnosis present

## 2018-01-09 DIAGNOSIS — K761 Chronic passive congestion of liver: Secondary | ICD-10-CM | POA: Diagnosis present

## 2018-01-09 DIAGNOSIS — E119 Type 2 diabetes mellitus without complications: Secondary | ICD-10-CM | POA: Diagnosis not present

## 2018-01-09 DIAGNOSIS — I10 Essential (primary) hypertension: Secondary | ICD-10-CM | POA: Diagnosis not present

## 2018-01-09 DIAGNOSIS — Z8701 Personal history of pneumonia (recurrent): Secondary | ICD-10-CM

## 2018-01-09 DIAGNOSIS — R0689 Other abnormalities of breathing: Secondary | ICD-10-CM | POA: Diagnosis not present

## 2018-01-09 DIAGNOSIS — D631 Anemia in chronic kidney disease: Secondary | ICD-10-CM | POA: Diagnosis not present

## 2018-01-09 DIAGNOSIS — G928 Other toxic encephalopathy: Secondary | ICD-10-CM

## 2018-01-09 DIAGNOSIS — R918 Other nonspecific abnormal finding of lung field: Secondary | ICD-10-CM | POA: Diagnosis not present

## 2018-01-09 DIAGNOSIS — E11319 Type 2 diabetes mellitus with unspecified diabetic retinopathy without macular edema: Secondary | ICD-10-CM | POA: Diagnosis present

## 2018-01-09 DIAGNOSIS — R531 Weakness: Secondary | ICD-10-CM | POA: Diagnosis not present

## 2018-01-09 DIAGNOSIS — Z85820 Personal history of malignant melanoma of skin: Secondary | ICD-10-CM

## 2018-01-09 DIAGNOSIS — G4733 Obstructive sleep apnea (adult) (pediatric): Secondary | ICD-10-CM | POA: Diagnosis present

## 2018-01-09 LAB — COMPREHENSIVE METABOLIC PANEL
ALT: 93 U/L — ABNORMAL HIGH (ref 0–44)
AST: 109 U/L — ABNORMAL HIGH (ref 15–41)
Albumin: 3.2 g/dL — ABNORMAL LOW (ref 3.5–5.0)
Alkaline Phosphatase: 158 U/L — ABNORMAL HIGH (ref 38–126)
Anion gap: 17 — ABNORMAL HIGH (ref 5–15)
BUN: 25 mg/dL — ABNORMAL HIGH (ref 8–23)
CO2: 25 mmol/L (ref 22–32)
Calcium: 8.8 mg/dL — ABNORMAL LOW (ref 8.9–10.3)
Chloride: 96 mmol/L — ABNORMAL LOW (ref 98–111)
Creatinine, Ser: 6.16 mg/dL — ABNORMAL HIGH (ref 0.61–1.24)
GFR calc Af Amer: 10 mL/min — ABNORMAL LOW (ref >60)
GFR calc non Af Amer: 9 mL/min — ABNORMAL LOW (ref >60)
Glucose, Bld: 238 mg/dL — ABNORMAL HIGH (ref 70–99)
Potassium: 4.8 mmol/L (ref 3.5–5.1)
Sodium: 138 mmol/L (ref 135–145)
Total Bilirubin: 1.6 mg/dL — ABNORMAL HIGH (ref 0.3–1.2)
Total Protein: 8 g/dL (ref 6.5–8.1)

## 2018-01-09 LAB — CBC WITH DIFFERENTIAL/PLATELET
Basophils Absolute: 0 10*3/uL (ref 0.0–0.1)
Basophils Relative: 0 %
Eosinophils Absolute: 0 10*3/uL (ref 0.0–0.7)
Eosinophils Relative: 0 %
HCT: 34 % — ABNORMAL LOW (ref 39.0–52.0)
Hemoglobin: 10.8 g/dL — ABNORMAL LOW (ref 13.0–17.0)
Lymphocytes Relative: 17 %
Lymphs Abs: 1.2 10*3/uL (ref 0.7–4.0)
MCH: 29.9 pg (ref 26.0–34.0)
MCHC: 31.8 g/dL (ref 30.0–36.0)
MCV: 94.2 fL (ref 78.0–100.0)
Monocytes Absolute: 0.6 10*3/uL (ref 0.1–1.0)
Monocytes Relative: 8 %
Neutro Abs: 5.3 10*3/uL (ref 1.7–7.7)
Neutrophils Relative %: 75 %
Platelets: 207 10*3/uL (ref 150–400)
RBC: 3.61 MIL/uL — ABNORMAL LOW (ref 4.22–5.81)
RDW: 16.5 % — ABNORMAL HIGH (ref 11.5–15.5)
WBC: 7.1 10*3/uL (ref 4.0–10.5)

## 2018-01-09 LAB — I-STAT CG4 LACTIC ACID, ED
Lactic Acid, Venous: 5.49 mmol/L (ref 0.5–1.9)
Lactic Acid, Venous: 10.29 mmol/L (ref 0.5–1.9)

## 2018-01-09 LAB — POCT I-STAT 3, ART BLOOD GAS (G3+)
Acid-base deficit: 11 mmol/L — ABNORMAL HIGH (ref 0.0–2.0)
Bicarbonate: 17 mmol/L — ABNORMAL LOW (ref 20.0–28.0)
O2 Saturation: 22 %
Patient temperature: 97.5
TCO2: 18 mmol/L — ABNORMAL LOW (ref 22–32)
pCO2 arterial: 42.4 mmHg (ref 32.0–48.0)
pH, Arterial: 7.208 — ABNORMAL LOW (ref 7.350–7.450)
pO2, Arterial: 19 mmHg — CL (ref 83.0–108.0)

## 2018-01-09 LAB — BRAIN NATRIURETIC PEPTIDE: B Natriuretic Peptide: 276 pg/mL — ABNORMAL HIGH (ref 0.0–100.0)

## 2018-01-09 LAB — MRSA PCR SCREENING: MRSA by PCR: NEGATIVE

## 2018-01-09 LAB — GLUCOSE, CAPILLARY: Glucose-Capillary: 190 mg/dL — ABNORMAL HIGH (ref 70–99)

## 2018-01-09 LAB — I-STAT TROPONIN, ED: Troponin i, poc: 0.08 ng/mL (ref 0.00–0.08)

## 2018-01-09 LAB — PROCALCITONIN: Procalcitonin: 1.44 ng/mL

## 2018-01-09 LAB — PROTIME-INR
INR: 6.62
Prothrombin Time: 57.3 seconds — ABNORMAL HIGH (ref 11.4–15.2)

## 2018-01-09 LAB — LIPASE, BLOOD: Lipase: 25 U/L (ref 11–51)

## 2018-01-09 LAB — CBG MONITORING, ED: Glucose-Capillary: 200 mg/dL — ABNORMAL HIGH (ref 70–99)

## 2018-01-09 MED ORDER — PIPERACILLIN-TAZOBACTAM 3.375 G IVPB 30 MIN
3.3750 g | Freq: Once | INTRAVENOUS | Status: AC
  Administered 2018-01-09: 3.375 g via INTRAVENOUS
  Filled 2018-01-09: qty 50

## 2018-01-09 MED ORDER — PANTOPRAZOLE SODIUM 40 MG IV SOLR
40.0000 mg | Freq: Two times a day (BID) | INTRAVENOUS | Status: DC
  Administered 2018-01-10 – 2018-01-14 (×9): 40 mg via INTRAVENOUS
  Filled 2018-01-09 (×10): qty 40

## 2018-01-09 MED ORDER — SODIUM BICARBONATE 8.4 % IV SOLN
INTRAVENOUS | Status: AC
  Administered 2018-01-09: 100 meq via INTRAVENOUS
  Filled 2018-01-09: qty 100

## 2018-01-09 MED ORDER — FENTANYL CITRATE (PF) 100 MCG/2ML IJ SOLN
25.0000 ug | INTRAMUSCULAR | Status: DC | PRN
  Administered 2018-01-09: 25 ug via INTRAVENOUS
  Filled 2018-01-09 (×2): qty 2

## 2018-01-09 MED ORDER — NOREPINEPHRINE 4 MG/250ML-% IV SOLN
INTRAVENOUS | Status: AC
  Administered 2018-01-09: 4 mg via INTRAVENOUS
  Filled 2018-01-09: qty 250

## 2018-01-09 MED ORDER — ONDANSETRON HCL 4 MG/2ML IJ SOLN
INTRAMUSCULAR | Status: AC
Start: 2018-01-09 — End: 2018-01-09
  Administered 2018-01-09: 15:00:00
  Filled 2018-01-09: qty 2

## 2018-01-09 MED ORDER — FENTANYL CITRATE (PF) 100 MCG/2ML IJ SOLN
50.0000 ug | Freq: Once | INTRAMUSCULAR | Status: AC
  Administered 2018-01-09: 50 ug via INTRAVENOUS
  Filled 2018-01-09: qty 2

## 2018-01-09 MED ORDER — HEPARIN SODIUM (PORCINE) 5000 UNIT/ML IJ SOLN: 5000.0000 [IU] | Freq: Three times a day (TID) | INTRAMUSCULAR | Status: DC

## 2018-01-09 MED ORDER — ONDANSETRON HCL 4 MG/2ML IJ SOLN
4.0000 mg | Freq: Four times a day (QID) | INTRAMUSCULAR | Status: DC | PRN
Start: 2018-01-09 — End: 2018-01-21

## 2018-01-09 MED ORDER — SODIUM BICARBONATE 8.4 % IV SOLN
100.0000 meq | Freq: Once | INTRAVENOUS | Status: AC
  Administered 2018-01-09: 100 meq via INTRAVENOUS

## 2018-01-09 MED ORDER — VITAMIN K1 10 MG/ML IJ SOLN
10.0000 mg | Freq: Once | INTRAMUSCULAR | Status: AC
  Administered 2018-01-09: 10 mg via SUBCUTANEOUS
  Filled 2018-01-09: qty 1

## 2018-01-09 MED ORDER — SODIUM CHLORIDE 0.9 % IV SOLN
250.0000 mL | INTRAVENOUS | Status: DC | PRN
  Administered 2018-01-10: 250 mL via INTRAVENOUS

## 2018-01-09 MED ORDER — SODIUM CHLORIDE 0.9 % IV SOLN
INTRAVENOUS | Status: DC
Start: 2018-01-09 — End: 2018-01-10
  Administered 2018-01-10: via INTRAVENOUS

## 2018-01-09 MED ORDER — VANCOMYCIN HCL IN DEXTROSE 1-5 GM/200ML-% IV SOLN
1000.0000 mg | Freq: Once | INTRAVENOUS | Status: DC
  Filled 2018-01-09: qty 200

## 2018-01-09 MED ORDER — PIPERACILLIN-TAZOBACTAM 3.375 G IVPB
3.3750 g | Freq: Two times a day (BID) | INTRAVENOUS | Status: DC
  Administered 2018-01-10 (×2): 3.375 g via INTRAVENOUS
  Filled 2018-01-09 (×2): qty 50

## 2018-01-09 MED ORDER — NOREPINEPHRINE 4 MG/250ML-% IV SOLN
0.0000 ug/min | INTRAVENOUS | Status: DC
  Administered 2018-01-09: 4 mg via INTRAVENOUS
  Administered 2018-01-10: 25 ug/min via INTRAVENOUS
  Administered 2018-01-10 (×2): 40 ug/min via INTRAVENOUS
  Administered 2018-01-10: 30 ug/min via INTRAVENOUS
  Administered 2018-01-10: 80 ug/min via INTRAVENOUS
  Filled 2018-01-09 (×5): qty 250

## 2018-01-09 MED ORDER — INSULIN ASPART 100 UNIT/ML ~~LOC~~ SOLN
0.0000 [IU] | SUBCUTANEOUS | Status: DC
  Administered 2018-01-09: 3 [IU] via SUBCUTANEOUS
  Administered 2018-01-10: 5 [IU] via SUBCUTANEOUS

## 2018-01-09 MED ORDER — SODIUM CHLORIDE 0.9 % IV BOLUS
500.0000 mL | Freq: Once | INTRAVENOUS | Status: AC
  Administered 2018-01-09: 500 mL via INTRAVENOUS

## 2018-01-09 MED ORDER — SODIUM CHLORIDE 0.9 % IV SOLN: 250.0000 mL | INTRAVENOUS | Status: DC | PRN

## 2018-01-09 MED ORDER — NOREPINEPHRINE BITARTRATE 1 MG/ML IV SOLN: 0.1000 ug/kg/min | Freq: Once | INTRAVENOUS | Status: DC

## 2018-01-09 NOTE — H&P (Addendum)
PULMONARY / CRITICAL CARE MEDICINE   Name: [name] MRN: [id number] DOB: 10/05/1950    ADMISSION DATE:  01/09/2018 CONSULTATION DATE: 01/09/18  REFERRING MD: Transfer from [place]: Shock  HISTORY OF PRESENT ILLNESS:   83yoM with hx ESRD on HD (last HD on 01/08/18), OSA, PVD, NHL (2009), DM, HTN, GERD, Anemia, CHF (EF 20%), LE DVT (remote 2yrs ago per pt) on Coumadin, who presented to [place] c/o "feeling bad" since dialysis on 8/10. When EMS was called, they found him to have BP 80/30 which improved to 100/30 following 500cc IVF bolus. In the OSH ER, patient again became hypotensive requiring initiation of levophed via a PIV. CXR showed a LLL infiltrate, for which he was started on Vanc and Zosyn then transferred to [facility] ICU.   On arrival, patient c/o 10/10 severe diffuse abdominal pain that has been constant x 1 day; also c/o nonbloody N/V. Denies diarrhea, CP, SOB, Cough, F/C. He is currently writhing in bed and dry heaving at time of my exam. Levophed @ 11mcg via a PIV.   PAST MEDICAL HISTORY :  He  has a past medical history of Anemia, Arthritis, Blood transfusion, CKD (chronic kidney disease) stage 4, GFR 15-29 ml/min (HCC) (03/18/2012), Diabetic nephropathy ([place]), Diabetic retinopathy, DVT (deep venous thrombosis) ([place]), ESRD (end stage renal disease) on dialysis [facility]), Family history of anesthesia complication, GERD (gastroesophageal reflux disease), Headache(784.0), Hyperlipidemia, Hypertension, IDDM (insulin dependent diabetes mellitus) ([place]), Nodular lymphoma of intra-abdominal lymph nodes ([place]), Non Hodgkin's lymphoma ([place]), Noncompliance (03/16/2012), NSVT (nonsustained ventricular tachycardia) ([place]) (03/18/2012), Peripheral vascular disease ([place]), Pneumonia (2013), Poor historian, Skin cancer, and Sleep apnea.  PAST SURGICAL HISTORY: He  has a past surgical history that includes Portacath placement; Port-a-cath removal; Coloscopy; Pars  plana vitrectomy (08/27/2011); Eye surgery (Bilateral); Pars plana vitrectomy (05/10/2012); Membrane peel (05/10/2012); Photocoagulation with laser (05/10/2012); Gas insertion (05/10/2012); Vena cava filter placement (09/2012); AV fistula placement (Left, 02/03/2013); Cardiac catheterization; Lesion excision (Right, 05/08/2015); Colonoscopy (N/A, 09/23/2015); AV fistula placement (Right, 11/08/2015); Bascilic vein transposition (Right, 01/24/2016); Transmetatarsal amputation (Right, 03/13/2016); Achilles tendon surgery (Right, 03/13/2016); and LEFT HEART CATH AND CORONARY ANGIOGRAPHY (N/A, 09/11/2016).  No Known Allergies  No current facility-administered medications on file prior to encounter.    Current Outpatient Medications on File Prior to Encounter  Medication Sig  . acetaminophen (TYLENOL) 325 MG tablet Take 650 mg by mouth every 6 (six) hours as needed (pain).  Marland Kitchen ergocalciferol (VITAMIN D2) 50000 units capsule Take 50,000 Units by mouth once a week. Monday  . insulin aspart (NOVOLOG) 100 UNIT/ML injection Inject 10 Units into the skin 3 (three) times daily before meals.   . insulin glargine (LANTUS) 100 UNIT/ML injection Inject 0.15 mLs (15 Units total) into the skin at bedtime.  . isosorbide-hydrALAZINE (BIDIL) 20-37.5 MG tablet Take 1 tablet by mouth 3 (three) times daily.  Marland Kitchen lidocaine-prilocaine (EMLA) cream Apply 1 application topically as needed (Apply small amount to access site 1-2 hours before dialysis. Cover with occlusive dressing (saran wrap)).   . metoprolol succinate (TOPROL-XL) 25 MG 24 hr tablet Take 1 tablet (25 mg total) by mouth daily. Take with or immediately following a meal.  . multivitamin (RENA-VIT) TABS tablet Take 1 tablet by mouth at bedtime.  . polysaccharide iron (NIFEREX) 150 MG CAPS capsule Take 1 capsule (150 mg total) by mouth daily.  . sevelamer carbonate (RENVELA) 800 MG tablet Take 1,600 mg by mouth 3 (three) times daily with meals.  Marland Kitchen  warfarin (COUMADIN) 5 MG  tablet Take 7.5-10 mg by mouth daily. Take 7.5 mg on Sun / Tues / Wed / Fri / Sat  Take 10 mg on Mon / Thurs   FAMILY HISTORY:  His family history includes Anesthesia problems in his son; Diabetes in his father; Hypertension in his father and son; Other in his father. There is no history of Colon cancer.  SOCIAL HISTORY: He  reports that he has never smoked. He has never used smokeless tobacco. He reports that he does not drink alcohol or use drugs.  REVIEW OF SYSTEMS:   Review of Systems  Constitutional: Negative.   HENT: Negative.   Eyes: Negative.   Respiratory: Negative.   Cardiovascular: Negative.   Gastrointestinal: Positive for abdominal pain, nausea and vomiting. Negative for blood in stool and diarrhea.  Genitourinary: Negative.   Musculoskeletal: Negative.   Skin: Negative.   Neurological: Negative.   Endo/Heme/Allergies: Negative.   Psychiatric/Behavioral: Negative.    SUBJECTIVE:  Lying on ICU bed in distress due to abdominal pain and in shock on vasopressors   VITAL SIGNS: BP 122/87   Pulse (!) 107   Temp (!) 97.5 F (36.4 C) (Oral)   Resp (!) 25   Ht 6\' 3"  (1.905 m)   Wt 111.3 kg   SpO2 94%   BMI 30.67 kg/m   HEMODYNAMICS:  Levophed @ 49mcg  INTAKE / OUTPUT: I/O last 3 completed shifts: In: 550.2 [IV Piggyback:550.2] Out: -   PHYSICAL EXAMINATION: General: WDWN Adult male, in distress due to severe abdominal pain, critically ill in shock Neuro: AAOx3, moving all extremities, answering questions and obeying commands  HEENT: OP clear, MM moist  Cardiovascular: Tachycardic with a regular rhythm, no m/r/g Lungs: CTA b/l, no respiratory distress, speaking in full sentences Abdomen: flat, TTP diffusely with guarding and rebound, no bowel sounds Musculoskeletal: no LE edema; Right foot with toe amputation Skin: no rashes   LABS:  BMET Recent Labs  Lab 01/09/18 1532  NA 138  K 4.8  CL 96*  CO2 25  BUN 25*  CREATININE 6.16*  GLUCOSE 238*    Electrolytes Recent Labs  Lab 01/09/18 1532  CALCIUM 8.8*   CBC Recent Labs  Lab 01/09/18 1532  WBC 7.1  HGB 10.8*  HCT 34.0*  PLT 207   Coag's Recent Labs  Lab 01/09/18 1532  INR 6.62*   Sepsis Markers Recent Labs  Lab 01/09/18 1528 01/09/18 1532 01/09/18 2009  LATICACIDVEN 5.49*  --  10.29*  PROCALCITON  --  1.44  --    ABG No results for input(s): PHART, PCO2ART, PO2ART in the last 168 hours.  Liver Enzymes Recent Labs  Lab 01/09/18 1532  AST 109*  ALT 93*  ALKPHOS 158*  BILITOT 1.6*  ALBUMIN 3.2*   Cardiac Enzymes No results for input(s): TROPONINI, PROBNP in the last 168 hours.  Glucose Recent Labs  Lab 01/09/18 1719 01/09/18 2106  GLUCAP 200* 190*   Imaging Dg Chest 2 View  Result Date: 01/09/2018 CLINICAL DATA:  Dialysis treatment today. Patient not feeling well since that time. EXAM: CHEST - 2 VIEW COMPARISON:  March 05, 2016 FINDINGS: Stable cardiomegaly. Increased opacity in left retrocardiac region may represent atelectasis or infiltrate. This is best seen on the lateral view. The hila and mediastinum are normal. No pneumothorax. No pulmonary nodules or masses. IMPRESSION: Left retrocardiac opacity could represent atelectasis or infiltrate. Recommend clinical correlation and follow-up to resolution. Electronically Signed   By: Dorise Bullion III M.D  On: 01/09/2018 16:08   Dg Abd 1 View  Result Date: 01/09/2018 CLINICAL DATA:  Abdominal pain EXAM: ABDOMEN - 1 VIEW COMPARISON:  None. FINDINGS: Scattered large and small bowel gas is noted. IVC filter is noted in place. Diffuse vascular calcifications are seen. No acute bony abnormality is noted. IMPRESSION: No acute abnormality seen. Electronically Signed   By: Inez Catalina [name]   On: 01/09/2018 23:13   Dg Chest Port 1 View  Result Date: 01/09/2018 CLINICAL DATA:  Evaluate pneumonia. EXAM: PORTABLE CHEST 1 VIEW COMPARISON:  01/09/2018. FINDINGS: Stable cardiac enlargement and aortic  atherosclerosis. Significantly diminished lung volumes. Mild pulmonary edema suspected. Left lower lobe airspace opacity is unchanged from previous exam. IMPRESSION: 1. No change in aeration to the left lung base compared with previous exam. 2. Suspect mild CHF. Aortic Atherosclerosis (ICD10-I70.0). Electronically Signed   By: Kerby Moors [name]   On: 01/09/2018 23:14   STUDIES:  CT Abdomen/Pelvis w IV contrast (8/12): ordered   CULTURES: Blood culture (8/11): pending Sputum culture (8/11): ordered  UA (8/11): ordered  ANTIBIOTICS: Vanc 8/11>> Zosyn 8/11>>  SIGNIFICANT EVENTS: 8/11: presented to [facility] ER with abd pain, shock, N/V >> diagnosed with pneumonia and transferred to [facility] ICU  LINES/TUBES: RUE AV fistula Left hand PIV [facility] TLC 8/12>>  DISCUSSION: 66yoM with hx ESRD on HD (last HD on 01/08/18), OSA, PVD, NHL (2009), DM, HTN, GERD, Anemia, CHF (EF 20%), LE DVT (remote 43yrs ago per pt) on Coumadin, who presented to [place] c/o "feeling bad" since dialysis on 8/10. When EMS was called, they found him to have BP 80/30 which improved to 100/30 following 500cc IVF bolus. In the OSH ER, patient again became hypotensive requiring initiation of levophed via a PIV. CXR showed a LLL infiltrate, for which he was started on Vanc and Zosyn then transferred to [facility] ICU.   On arrival, patient c/o 10/10 severe diffuse abdominal pain that has been constant x 1 day; also c/o nonbloody N/V. Denies diarrhea, CP, SOB, Cough, F/C. He is currently writhing in bed and dry heaving at time of my exam. Levophed @ 68mcg via a PIV.   ASSESSMENT / PLAN:  PULMONARY: 1. Acute Hypoxic Respiratory failure; LLL Pneumonia: - Pox 84% on 6L O2, 100% on 10L; no respiratory distress - CXR on my review shows LLL retrocardiac infiltrate - VBG shows metabolic acidosis but no respiratory acidosis - treatment of pneumonia discussed below  CARDIOVASCULAR 1. Shock; Afib RVR (new  diagnosis); hx HTN and CHF (EF 20%); QT prolongation - currently in shock; hold home antihypertensive medications - new diagnosis Afib, likely in setting of sepsis.  - TTE in AM - EKG shows Afib with RVR to 124; RBBB (old); QTc 583; no ST changes.  RENAL 1. ESRD on HD: - last HD on 8/10; consult Renal in AM  GASTROINTESTINAL 1. Abdominal pain; N/V; Transaminitis - unclear etiology; diffuse severe constant abd pain x 1 day. Mild transaminitis. Lipase normal. In setting of coagulopathy, I am very concerned he may have acute retroperitoneal bleed. He is a vasculopath so ischemic colitis is another possibility.  - KUB shows no SBO - CT Abdomen ordered   HEMATOLOGIC 1. 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; Coagulopathy; Anemia - on coumadin chronically; INR 6.62 at OSH today. Received 10mg  Vitamin K IV @ 1907. Recheck INR now. If still high will need FFP - Hgb 10.8 at OSH today; repeat now  INFECTIOUS 1. Septic shock: due to LLL  Pneumonia +/- Intra-abdominal infxn; Metabolic acidosis due to severe Lactic acidosis - s/p only 500cc IVF given at OSH in setting of ESRD and CHF (EF 20%); given how hypoxic he is here, will hold of on further boluses and start slow NS gtt instead.  - check CVP q4hrs and rebolus IVF's based on those results; may need to intubate him in order to appropriately treat his shock - lactate 10.9 and pH 7.20; gave 2 amps Bicarb IV; repeat blood gas in 2 hours.  - check cortisol and procalcitonin, trend lactate - 1 blood culture sent from OSH today; send 2nd blood culture now. Order UA (not clear if he makes urine) and Sputum culture - Continue Vanc and Zosyn - CT Abdomen ordered   ENDOCRINE 1. DM: - NPO; SSI q4  NEUROLOGIC 1. Acute Encephalopathy: - was alert on my initial exam, now somnolent after receiving pain meds, still awakens to voice and obeying commands; continue to monitor closely   FAMILY  - Updated patient's family members at [facility]  family meet or Palliative Care meeting due by: 01/16/18   60 minutes nonprocedural critical care time  Vernie Murders, MD  Pulmonary and [place] Pager: [phone]  01/09/2018, 11:22 PM

## 2018-01-09 NOTE — ED Notes (Signed)
CRITICAL VALUE ALERT  Critical Value:  5.49  Date & Time Notied: 01/09/18 15:32  Provider Notified: Dr. Laverta Baltimore

## 2018-01-09 NOTE — ED Notes (Signed)
Having trouble obtaining pt's BP. Pt is now vomiting.

## 2018-01-09 NOTE — ED Notes (Signed)
Per Dr. Laverta Baltimore start Levophed in peripheral IV in order to maintain patient's blood pressure.

## 2018-01-09 NOTE — ED Provider Notes (Signed)
Emergency Department Provider Note   I have reviewed the triage vital signs and the nursing notes.   HISTORY  Chief Complaint Hypotension   HPI GEROGE [name] is a 67 y.o. male with PMH of ESRD on HD and DM since to the emergency department by EMS with not feeling well and hypotension.  Patient states he had dialysis yesterday but has been feeling badly since that time.  This morning he had some back, left abdomen, left chest pain which seems to be worsening.  Pain symptoms are constant but denies shortness of breath.  No fevers or chills.  No productive cough.  EMS state that the patient was hypotensive on scene to the 80s and needed assistance but was able to walk 2 steps to the stretcher.  He was given 400 mL's of fluid in route.    Past Medical History:  Diagnosis Date  . Anemia   . Arthritis    HNP- lumbar, "all over my body"  . Blood transfusion    "years ago; blood was low" (08/05/2013)  . CKD (chronic kidney disease) stage 4, GFR 15-29 ml/min (HCC) 03/18/2012   [place]- T,TH,Sat.  . Diabetic nephropathy ([place])   . Diabetic retinopathy   . DVT (deep venous thrombosis) ([place])    "got one in my right leg now; I've had one before too, not sure which leg" (08/05/2013)  . ESRD (end stage renal disease) on dialysis [facility])    "just started today, (08/04/2013)"  . Family history of anesthesia complication    " my son wakes up slowly"  . GERD (gastroesophageal reflux disease)    uses alka seltzere on occas.   [name])    "one q now and then" (08/05/2013)  . Hyperlipidemia   . Hypertension   . 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 Non Hodgkin's lymphoma ([place])    Tx 2009; "had chemo; it went away" (08/05/2013)  . Noncompliance 03/16/2012  . NSVT (nonsustained ventricular tachycardia) ([place]) 03/18/2012  . Peripheral vascular disease ([place])   . Pneumonia 2013   hosp.-   . Poor  historian    pt. unsure of several answers to health history questions   . Skin cancer    melanoma - head  . Sleep apnea    "suppose to have a sleep study, but they never told me when. (08/05/2013)    Patient Active Problem List   Diagnosis Date Noted  . Hypotension 01/09/2018  . Abnormal nuclear stress test 09/11/2016  . Systolic heart failure ([place])   . History of colonic polyps   . Benign neoplasm of transverse colon   . ESRD (end stage renal disease) ([place]) 01/31/2013  . Chronic anticoagulation 12/27/2012  . Vitreous hemorrhage ([place]) 04/19/2012  . NSVT (nonsustained ventricular tachycardia) ([place]) 03/18/2012  . Noncompliance 03/16/2012  . Chest pain, no MI, negative myoview, most likely muscular sketal pain 03/15/2012  . Proliferative diabetic retinopathy associated with type 2 diabetes mellitus ([place]) 08/18/2011  . 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 Non Hodgkin's lymphoma ([place])   . Hyperlipidemia   . Diabetic nephropathy ([place])   . DVT (deep venous thrombosis) ([place])   . Anemia in chronic kidney disease 01/12/2011  . Warfarin-induced coagulopathy ([place]) 01/12/2011  . History of DVT of lower extremity 01/12/2011  . ABDOMINAL PAIN -GENERALIZED 10/24/2008  . PERSONAL HX COLONIC POLYPS 10/24/2008    Past Surgical  History:  Procedure Laterality Date  . ACHILLES TENDON SURGERY Right 03/13/2016   Procedure: ACHILLES LENGTHENING/KIDNER;  Surgeon: Edrick Kins, DPM;  Location: [place];  Service: Podiatry;  Laterality: Right;  . AV FISTULA PLACEMENT Left 02/03/2013   Procedure: ARTERIOVENOUS (AV) FISTULA CREATION- LEFT RADIAL CEPHALIC; ULTRASOUND GUIDED;  Surgeon: Mal Misty, MD;  Location[facility] OR;  Service: Vascular;  Laterality: Left;  . AV FISTULA PLACEMENT Right 11/08/2015   Procedure: RIGHT BRACHIOCEPHALIC ARTERIOVENOUS (AV) FISTULA CREATION;  Surgeon: Serafina Mitchell, MD;  Location: [place];  Service: Vascular;  Laterality: Right;  . BASCILIC VEIN TRANSPOSITION Right  01/24/2016   Procedure: RIGHT SECOND STAGE BASILIC VEIN TRANSPOSITION;  Surgeon: Angelia Mould, MD;  Location: [place];  Service: Vascular;  Laterality: Right;  . CARDIAC CATHETERIZATION    . COLONOSCOPY N/A 09/23/2015   Procedure: COLONOSCOPY;  Surgeon: Irene Shipper, MD;  Location: [facility];  Service: Endoscopy;  Laterality: N/A;  . Coloscopy    . EYE SURGERY Bilateral   . GAS INSERTION  05/10/2012   Procedure: INSERTION OF GAS;  Surgeon: Hayden Pedro, MD;  Location: [place];  Service: Ophthalmology;  Laterality: Right;  . LEFT HEART CATH AND CORONARY ANGIOGRAPHY N/A 09/11/2016   Procedure: Left Heart Cath and Coronary Angiography;  Surgeon: Belva Crome, MD;  Location: [place] CV LAB;  Service: Cardiovascular;  Laterality: N/A;  . LESION EXCISION Right 05/08/2015   Procedure: EXCISION SCALP LESION;  Surgeon: Erroll Luna, MD;  Location: [place];  Service: General;  Laterality: Right;  . MEMBRANE PEEL  05/10/2012   Procedure: MEMBRANE PEEL;  Surgeon: Hayden Pedro, MD;  Location: [place];  Service: Ophthalmology;  Laterality: Right;  . PARS PLANA VITRECTOMY  08/27/2011   Procedure: PARS PLANA VITRECTOMY WITH 25 GAUGE;  Surgeon: Hayden Pedro, MD;  Location: [place];  Service: Ophthalmology;  Laterality: Left;  Repair of complex traction retinal detachment left eye  . PARS PLANA VITRECTOMY  05/10/2012   Procedure: PARS PLANA VITRECTOMY WITH 25 GAUGE;  Surgeon: Hayden Pedro, MD;  Location: [place];  Service: Ophthalmology;  Laterality: Right;  Repair Complex Traction Retinal Detachment  . PHOTOCOAGULATION WITH LASER  05/10/2012   Procedure: PHOTOCOAGULATION WITH LASER;  Surgeon: Hayden Pedro, MD;  Location: [place];  Service: Ophthalmology;  Laterality: Right;  . PORT-A-CATH REMOVAL    . PORTACATH PLACEMENT    . TRANSMETATARSAL AMPUTATION Right 03/13/2016   Procedure: TRANSMETATARSAL AMPUTATION;  Surgeon: Edrick Kins, DPM;  Location: [place];  Service: Podiatry;  Laterality:  Right;  . VENA CAVA FILTER PLACEMENT  09/2012   due to preparation for surgery    Allergies Patient has no known allergies.  Family History  Problem Relation Age of Onset  . Anesthesia problems Son   . Hypertension Son   . Diabetes Father   . Hypertension Father   . Other Father        amputation  . Colon cancer Neg Hx     Social History Social History   Tobacco Use  . Smoking status: Never Smoker  . Smokeless tobacco: Never Used  Substance Use Topics  . Alcohol use: No    Alcohol/week: 0.0 standard drinks  . Drug use: No    Review of Systems  Constitutional: No fever/chills. Positive lightheadedness.  Eyes: No visual changes. ENT: No sore throat. Cardiovascular: Positive chest pain. Respiratory: Denies shortness of breath. Gastrointestinal: Positive left sided abdominal pain.  No nausea, no vomiting.  No diarrhea.  No constipation. Genitourinary: Patient does not make urine.  Musculoskeletal: Negative for back pain. Skin: Negative for rash. Neurological: Negative for headaches, focal weakness or numbness.  10-point ROS otherwise negative.  ____________________________________________   PHYSICAL EXAM:  VITAL SIGNS: ED Triage Vitals  Enc Vitals Group     BP 01/09/18 1519 (!) 90/40     Pulse Rate 01/09/18 1456 (!) 108     Resp 01/09/18 1456 17     Temp 01/09/18 1456 97.6 F (36.4 C)     Temp Source 01/09/18 1456 Axillary     SpO2 01/09/18 1456 99 %     Weight 01/09/18 1501 245 lb (111.1 kg)     Pain Score 01/09/18 1501 9   Constitutional: Alert and oriented. Some increased WOB but able to provide a history.  Eyes: Conjunctivae are normal.  Head: Atraumatic. Nose: No congestion/rhinnorhea. Mouth/Throat: Mucous membranes are moist.  Neck: No stridor.  Cardiovascular: Normal rate, regular rhythm. Good peripheral circulation. Grossly normal heart sounds. Some tenderness over the later lower chest wall. No crepitus.  Respiratory: Slight increased  respiratory effort.  No retractions. Lungs CTAB. Gastrointestinal: Soft and nontender. No distention.  Musculoskeletal: No lower extremity tenderness nor edema.  Neurologic:  Normal speech and language. No gross focal neurologic deficits are appreciated.  Skin:  Skin is warm, dry and intact. No rash noted.  ____________________________________________   LABS (all labs ordered are listed, but only abnormal results are displayed)  Labs Reviewed  COMPREHENSIVE METABOLIC PANEL - Abnormal; Notable for the following components:      Result Value   Chloride 96 (*)    Glucose, Bld 238 (*)    BUN 25 (*)    Creatinine, Ser 6.16 (*)    Calcium 8.8 (*)    Albumin 3.2 (*)    AST 109 (*)    ALT 93 (*)    Alkaline Phosphatase 158 (*)    Total Bilirubin 1.6 (*)    GFR calc non Af Amer 9 (*)    GFR calc Af Amer 10 (*)    Anion gap 17 (*)    All other components within normal limits  BRAIN NATRIURETIC PEPTIDE - Abnormal; Notable for the following components:   B Natriuretic Peptide 276.0 (*)    All other components within normal limits  CBC WITH DIFFERENTIAL/PLATELET - Abnormal; Notable for the following components:   RBC 3.61 (*)    Hemoglobin 10.8 (*)    HCT 34.0 (*)    RDW 16.5 (*)    All other components within normal limits  PROTIME-INR - Abnormal; Notable for the following components:   Prothrombin Time 57.3 (*)    INR 6.62 (*)    All other components within normal limits  I-STAT CG4 LACTIC ACID, ED - Abnormal; Notable for the following components:   Lactic Acid, Venous 5.49 (*)    All other components within normal limits  I-STAT CG4 LACTIC ACID, ED - Abnormal; Notable for the following components:   Lactic Acid, Venous 10.29 (*)    All other components within normal limits  CBG MONITORING, ED - Abnormal; Notable for the following components:   Glucose-Capillary 200 (*)    All other components within normal limits  CULTURE, BLOOD (ROUTINE X 2)  LIPASE, BLOOD  PROCALCITONIN    PROCALCITONIN  I-STAT TROPONIN, ED   ____________________________________________  EKG   EKG Interpretation  Date/Time:  Sunday January 09 2018 14:58:35 EDT Ventricular Rate:  115 PR Interval:  QRS Duration: 150 QT Interval:  407 QTC Calculation: 563 R Axis:   -57 Text Interpretation:  Sinus tachycardia with irregular rate IVCD, consider atypical RBBB LVH with IVCD, LAD and secondary repol abnrm Lateral leads are also involved Prolonged QT interval No STEMI.  Reconfirmed by Nanda Quinton [phone]) on 01/09/2018 4:32:56 PM       ____________________________________________  RADIOLOGY  Dg Chest 2 View  Result Date: 01/09/2018 CLINICAL DATA:  Dialysis treatment today. Patient not feeling well since that time. EXAM: CHEST - 2 VIEW COMPARISON:  March 05, 2016 FINDINGS: Stable cardiomegaly. Increased opacity in left retrocardiac region may represent atelectasis or infiltrate. This is best seen on the lateral view. The hila and mediastinum are normal. No pneumothorax. No pulmonary nodules or masses. IMPRESSION: Left retrocardiac opacity could represent atelectasis or infiltrate. Recommend clinical correlation and follow-up to resolution. Electronically Signed   By: Dorise Bullion III M.D   On: 01/09/2018 16:08    ____________________________________________   PROCEDURES  Procedure(s) performed:   .Critical Care Performed by: Margette Fast, MD Authorized by: Margette Fast, MD   Critical care provider statement:    Critical care time (minutes):  60   Critical care time was exclusive of:  Separately billable procedures and treating other patients and teaching time   Critical care was necessary to treat or prevent imminent or life-threatening deterioration of the following conditions:  Circulatory failure, sepsis and respiratory failure   Critical care was time spent personally by me on the following activities:  Blood draw for specimens, development of treatment plan with  patient or surrogate, discussions with consultants, evaluation of patient's response to treatment, examination of patient, obtaining history from patient or surrogate, ordering and performing treatments and interventions, ordering and review of laboratory studies, ordering and review of radiographic studies, pulse oximetry, re-evaluation of patient's condition and review of old charts   I assumed direction of critical care for this patient from another provider in my specialty: no      ____________________________________________   INITIAL IMPRESSION / [place] / ED COURSE  Pertinent labs & imaging results that were available during my care of the patient were reviewed by me and considered in my medical decision making (see chart for details).  Patient presents to the emergency department for evaluation of left chest/abdomen pain with hypotension and fatigue.  Patient's blood pressure on arrival has increased after 400 mL's of fluid were given.  Patient's last dialysis was yesterday and tells me that there was not much they could take off. Afebrile but has a lactate of 5.49. Will initiate abx and blood cultures.   CXR shows a retrocardiac opacity. This correlates well with the patient's area of pain. No leukocytosis or fever. Patient on Coumadin and IVC filter in place. Patient already received abx but will likely need to focus on HCAP coverage. SBP in the high 90s. Patient with ESRD and CHF with EF ~ 20%. Will continue IVF but gently in this clinical setting. Patient does not make urine.   06:24 PM Becoming progressively more hypotensive into the 60s. INR significantly elevated. No signs of bleeding. No gross blood in stool. I set up to place a femoral central venous access but in lying flat the patient became clammy felt much worse. No significant respiratory distress but seemed peri-code and less responsive. Started peripheral NE and blood pressures improved to the 80s with maps  greater than 65.  Patient is more awake and alert. I do not feel  the patient can tolerate lying flat at this time for central line placement.  He has two, well established peripheral IVs. Will continue with this access for now. Spoke with Dr. Debbora Dus with ICU who accepts the patient in transfer.   07:00 PM Patient complaining more of abdominal pain. Abdomen remains soft and non-tender. Some right sided pain now which is new. [facility] in transit. Will defer abdominal imaging at this time with critical patient nearing the time of transfer and unremarkable exam.   I reviewed all nursing notes, vitals, pertinent old records, EKGs, labs, imaging (as available).  ____________________________________________  FINAL CLINICAL IMPRESSION(S) / ED DIAGNOSES  Final diagnoses:  Healthcare-associated pneumonia  Sepsis, due to unspecified organism ([place])  Hypotension, unspecified hypotension type     MEDICATIONS GIVEN DURING THIS VISIT:  Medications  vancomycin (VANCOCIN) IVPB 1000 mg/200 mL premix (has no administration in time range)  vancomycin (VANCOCIN) IVPB 1000 mg/200 mL premix (has no administration in time range)  norepinephrine (LEVOPHED) 4mg  in D5W 237mL premix infusion (2.667 mcg/min Intravenous Rate/Dose Change 01/09/18 1808)  piperacillin-tazobactam (ZOSYN) IVPB 3.375 g (has no administration in time range)  ondansetron (ZOFRAN) 4 MG/2ML injection (  Given 01/09/18 1508)  piperacillin-tazobactam (ZOSYN) IVPB 3.375 g (0 g Intravenous Stopped 01/09/18 1713)  sodium chloride 0.9 % bolus 500 mL (0 mLs Intravenous Stopped 01/09/18 1742)  fentaNYL (SUBLIMAZE) injection 50 mcg (50 mcg Intravenous Given 01/09/18 1641)  phytonadione (VITAMIN K) SQ injection 10 mg (10 mg Subcutaneous Given 01/09/18 1907)    Note:  This document was prepared using Dragon voice recognition software and may include unintentional dictation errors.  Nanda Quinton, MD Emergency Medicine    Phyllip Claw, Wonda [facility], MD 01/09/18  2037

## 2018-01-09 NOTE — ED Notes (Signed)
Date and time results received: 01/09/18 5:19 PM  (use smartphrase ".now" to insert current time)  Test:  INR Critical Value: 6.62  Name of Provider Notified: Long  Orders Received? Or Actions Taken?: Orders Received - See Orders for details

## 2018-01-09 NOTE — ED Notes (Signed)
Date and time results received: 01/09/18 2012   Test: I-Stat Lactic Critical Value: 10.29  Name of Provider Notified: Long, MD

## 2018-01-09 NOTE — ED Notes (Signed)
Per Dr. Laverta Baltimore, hold vancomycin until blood pressure stabilizes.

## 2018-01-09 NOTE — ED Notes (Signed)
Per Dr. Laverta Baltimore change rate of Levophed to 10 mL/hr.

## 2018-01-09 NOTE — ED Notes (Signed)
Pt very uncomfortable in bed.  Repositioned for comfort.  States his pain under left ribs has increased significantly since arrival.  Is refusing second set of blood cultures at this time due to being a difficult stick.  MD made aware.

## 2018-01-09 NOTE — ED Triage Notes (Signed)
Pt had a full dialysis treatment yesterday. Has been feeling bad since. Upon EMS arrival BP 80/30. Given 500 mL of fluid and now BP is 100/30. Alert and oriented. MD at bedside.

## 2018-01-10 ENCOUNTER — Inpatient Hospital Stay (HOSPITAL_COMMUNITY): Payer: Medicare Other

## 2018-01-10 ENCOUNTER — Inpatient Hospital Stay (HOSPITAL_COMMUNITY): Payer: Medicare Other | Admitting: Certified Registered"

## 2018-01-10 ENCOUNTER — Encounter (HOSPITAL_COMMUNITY): Payer: Self-pay | Admitting: Anesthesiology

## 2018-01-10 ENCOUNTER — Telehealth: Payer: Self-pay

## 2018-01-10 ENCOUNTER — Encounter (HOSPITAL_COMMUNITY)
Admission: EM | Disposition: A | Discharge status: Skilled Nursing Facility | Payer: Self-pay | Source: Home / Self Care | Attending: Critical Care Medicine

## 2018-01-10 DIAGNOSIS — I313 Pericardial effusion (noninflammatory): Secondary | ICD-10-CM

## 2018-01-10 DIAGNOSIS — R579 Shock, unspecified: Secondary | ICD-10-CM

## 2018-01-10 DIAGNOSIS — I3139 Other pericardial effusion (noninflammatory): Secondary | ICD-10-CM | POA: Diagnosis present

## 2018-01-10 DIAGNOSIS — N186 End stage renal disease: Secondary | ICD-10-CM

## 2018-01-10 DIAGNOSIS — I314 Cardiac tamponade: Secondary | ICD-10-CM

## 2018-01-10 DIAGNOSIS — J810 Acute pulmonary edema: Secondary | ICD-10-CM

## 2018-01-10 HISTORY — PX: SUBXYPHOID PERICARDIAL WINDOW: SHX5075

## 2018-01-10 LAB — CBC
HCT: 34.3 % — ABNORMAL LOW (ref 39.0–52.0)
HCT: 40.5 % (ref 39.0–52.0)
Hemoglobin: 10.4 g/dL — ABNORMAL LOW (ref 13.0–17.0)
Hemoglobin: 12.3 g/dL — ABNORMAL LOW (ref 13.0–17.0)
MCH: 29.3 pg (ref 26.0–34.0)
MCH: 30.1 pg (ref 26.0–34.0)
MCHC: 30.3 g/dL (ref 30.0–36.0)
MCHC: 30.4 g/dL (ref 30.0–36.0)
MCV: 96.6 fL (ref 78.0–100.0)
MCV: 99 fL (ref 78.0–100.0)
Platelets: 234 10*3/uL (ref 150–400)
Platelets: 259 10*3/uL (ref 150–400)
RBC: 3.55 MIL/uL — ABNORMAL LOW (ref 4.22–5.81)
RBC: 4.09 MIL/uL — ABNORMAL LOW (ref 4.22–5.81)
RDW: 16.3 % — ABNORMAL HIGH (ref 11.5–15.5)
RDW: 16.5 % — ABNORMAL HIGH (ref 11.5–15.5)
WBC: 14.4 10*3/uL — ABNORMAL HIGH (ref 4.0–10.5)
WBC: 15.1 10*3/uL — ABNORMAL HIGH (ref 4.0–10.5)

## 2018-01-10 LAB — LACTIC ACID, PLASMA
Lactic Acid, Venous: 10.9 mmol/L (ref 0.5–1.9)
Lactic Acid, Venous: 10.9 mmol/L (ref 0.5–1.9)
Lactic Acid, Venous: 12.7 mmol/L (ref 0.5–1.9)

## 2018-01-10 LAB — CBC WITH DIFFERENTIAL/PLATELET
Abs Immature Granulocytes: 0.1 10*3/uL (ref 0.0–0.1)
Basophils Absolute: 0 10*3/uL (ref 0.0–0.1)
Basophils Relative: 0 %
Eosinophils Absolute: 0 10*3/uL (ref 0.0–0.7)
Eosinophils Relative: 0 %
HCT: 34 % — ABNORMAL LOW (ref 39.0–52.0)
Hemoglobin: 10.1 g/dL — ABNORMAL LOW (ref 13.0–17.0)
Immature Granulocytes: 1 %
Lymphocytes Relative: 8 %
Lymphs Abs: 0.9 10*3/uL (ref 0.7–4.0)
MCH: 29.4 pg (ref 26.0–34.0)
MCHC: 29.7 g/dL — ABNORMAL LOW (ref 30.0–36.0)
MCV: 99.1 fL (ref 78.0–100.0)
Monocytes Absolute: 0.7 10*3/uL (ref 0.1–1.0)
Monocytes Relative: 6 %
Neutro Abs: 10.3 10*3/uL — ABNORMAL HIGH (ref 1.7–7.7)
Neutrophils Relative %: 85 %
Platelets: 219 10*3/uL (ref 150–400)
RBC: 3.43 MIL/uL — ABNORMAL LOW (ref 4.22–5.81)
RDW: 16.4 % — ABNORMAL HIGH (ref 11.5–15.5)
WBC: 12.1 10*3/uL — ABNORMAL HIGH (ref 4.0–10.5)

## 2018-01-10 LAB — COOXEMETRY PANEL
Carboxyhemoglobin: 0.6 % (ref 0.5–1.5)
Methemoglobin: 1.9 % — ABNORMAL HIGH (ref 0.0–1.5)
O2 Saturation: 34.1 %
Total hemoglobin: 8.7 g/dL — ABNORMAL LOW (ref 12.0–16.0)

## 2018-01-10 LAB — COMPREHENSIVE METABOLIC PANEL
ALT: 418 U/L — ABNORMAL HIGH (ref 0–44)
AST: 434 U/L — ABNORMAL HIGH (ref 15–41)
Albumin: 2.7 g/dL — ABNORMAL LOW (ref 3.5–5.0)
Alkaline Phosphatase: 157 U/L — ABNORMAL HIGH (ref 38–126)
Anion gap: 26 — ABNORMAL HIGH (ref 5–15)
BUN: 28 mg/dL — ABNORMAL HIGH (ref 8–23)
CO2: 17 mmol/L — ABNORMAL LOW (ref 22–32)
Calcium: 8.7 mg/dL — ABNORMAL LOW (ref 8.9–10.3)
Chloride: 86 mmol/L — ABNORMAL LOW (ref 98–111)
Creatinine, Ser: 7.04 mg/dL — ABNORMAL HIGH (ref 0.61–1.24)
GFR calc Af Amer: 8 mL/min — ABNORMAL LOW (ref >60)
GFR calc non Af Amer: 7 mL/min — ABNORMAL LOW (ref >60)
Glucose, Bld: 487 mg/dL — ABNORMAL HIGH (ref 70–99)
Potassium: 5.5 mmol/L — ABNORMAL HIGH (ref 3.5–5.1)
Sodium: 129 mmol/L — ABNORMAL LOW (ref 135–145)
Total Bilirubin: 2 mg/dL — ABNORMAL HIGH (ref 0.3–1.2)
Total Protein: 7.3 g/dL (ref 6.5–8.1)

## 2018-01-10 LAB — GLUCOSE, CAPILLARY
Glucose-Capillary: 223 mg/dL — ABNORMAL HIGH (ref 70–99)
Glucose-Capillary: 197 mg/dL — ABNORMAL HIGH (ref 70–99)
Glucose-Capillary: 203 mg/dL — ABNORMAL HIGH (ref 70–99)
Glucose-Capillary: 185 mg/dL — ABNORMAL HIGH (ref 70–99)
Glucose-Capillary: 141 mg/dL — ABNORMAL HIGH (ref 70–99)
Glucose-Capillary: 108 mg/dL — ABNORMAL HIGH (ref 70–99)
Glucose-Capillary: 136 mg/dL — ABNORMAL HIGH (ref 70–99)

## 2018-01-10 LAB — BASIC METABOLIC PANEL
Anion gap: 27 — ABNORMAL HIGH (ref 5–15)
Anion gap: 31 — ABNORMAL HIGH (ref 5–15)
BUN: 33 mg/dL — ABNORMAL HIGH (ref 8–23)
BUN: 33 mg/dL — ABNORMAL HIGH (ref 8–23)
CO2: 17 mmol/L — ABNORMAL LOW (ref 22–32)
CO2: 17 mmol/L — ABNORMAL LOW (ref 22–32)
Calcium: 8.4 mg/dL — ABNORMAL LOW (ref 8.9–10.3)
Calcium: 8.6 mg/dL — ABNORMAL LOW (ref 8.9–10.3)
Chloride: 92 mmol/L — ABNORMAL LOW (ref 98–111)
Chloride: 90 mmol/L — ABNORMAL LOW (ref 98–111)
Creatinine, Ser: 7.24 mg/dL — ABNORMAL HIGH (ref 0.61–1.24)
Creatinine, Ser: 7.42 mg/dL — ABNORMAL HIGH (ref 0.61–1.24)
GFR calc Af Amer: 8 mL/min — ABNORMAL LOW (ref >60)
GFR calc Af Amer: 8 mL/min — ABNORMAL LOW (ref >60)
GFR calc non Af Amer: 7 mL/min — ABNORMAL LOW (ref >60)
GFR calc non Af Amer: 7 mL/min — ABNORMAL LOW (ref >60)
Glucose, Bld: 255 mg/dL — ABNORMAL HIGH (ref 70–99)
Glucose, Bld: 221 mg/dL — ABNORMAL HIGH (ref 70–99)
Potassium: 5.5 mmol/L — ABNORMAL HIGH (ref 3.5–5.1)
Potassium: 4.9 mmol/L (ref 3.5–5.1)
Sodium: 136 mmol/L (ref 135–145)
Sodium: 138 mmol/L (ref 135–145)

## 2018-01-10 LAB — POCT I-STAT 7, (LYTES, BLD GAS, ICA,H+H)
Acid-base deficit: 13 mmol/L — ABNORMAL HIGH (ref 0.0–2.0)
Bicarbonate: 15.8 mmol/L — ABNORMAL LOW (ref 20.0–28.0)
Calcium, Ion: 0.95 mmol/L — ABNORMAL LOW (ref 1.15–1.40)
HCT: 28 % — ABNORMAL LOW (ref 39.0–52.0)
Hemoglobin: 9.5 g/dL — ABNORMAL LOW (ref 13.0–17.0)
O2 Saturation: 100 %
Patient temperature: 36.3
Potassium: 4.2 mmol/L (ref 3.5–5.1)
Sodium: 133 mmol/L — ABNORMAL LOW (ref 135–145)
TCO2: 17 mmol/L — ABNORMAL LOW (ref 22–32)
pCO2 arterial: 46.6 mmHg (ref 32.0–48.0)
pH, Arterial: 7.134 — CL (ref 7.350–7.450)
pO2, Arterial: 310 mmHg — ABNORMAL HIGH (ref 83.0–108.0)

## 2018-01-10 LAB — PROTIME-INR
INR: >10
INR: 5.21
INR: 3.91
Prothrombin Time: 83.2 seconds — ABNORMAL HIGH (ref 11.4–15.2)
Prothrombin Time: 47.5 seconds — ABNORMAL HIGH (ref 11.4–15.2)
Prothrombin Time: 38 seconds — ABNORMAL HIGH (ref 11.4–15.2)

## 2018-01-10 LAB — ECHO INTRAOPERATIVE TEE
Height: 75 in
Weight: 3925.95 oz

## 2018-01-10 LAB — ECHOCARDIOGRAM LIMITED
Height: 75 in
Weight: 3925.95 oz

## 2018-01-10 LAB — TROPONIN I
Troponin I: 0.11 ng/mL (ref <0.03)
Troponin I: 0.33 ng/mL (ref <0.03)

## 2018-01-10 LAB — HIV ANTIBODY (ROUTINE TESTING W REFLEX): HIV Screen 4th Generation wRfx: NONREACTIVE

## 2018-01-10 LAB — TYPE AND SCREEN
ABO/RH(D): A POS
Antibody Screen: NEGATIVE

## 2018-01-10 LAB — PHOSPHORUS: Phosphorus: 8 mg/dL — ABNORMAL HIGH (ref 2.5–4.6)

## 2018-01-10 LAB — PROCALCITONIN: Procalcitonin: 3.04 ng/mL

## 2018-01-10 LAB — LIPASE, BLOOD: Lipase: 37 U/L (ref 11–51)

## 2018-01-10 LAB — MAGNESIUM: Magnesium: 2.2 mg/dL (ref 1.7–2.4)

## 2018-01-10 LAB — CORTISOL: Cortisol, Plasma: 39.5 ug/dL

## 2018-01-10 SURGERY — CREATION, PERICARDIAL WINDOW, SUBXIPHOID APPROACH
Anesthesia: General | Site: Chest

## 2018-01-10 MED ORDER — VASOPRESSIN 20 UNIT/ML IV SOLN
0.0300 [IU]/min | INTRAVENOUS | Status: DC
  Administered 2018-01-11: 0.03 [IU]/min via INTRAVENOUS
  Filled 2018-01-10: qty 2

## 2018-01-10 MED ORDER — ETOMIDATE 2 MG/ML IV SOLN
30.0000 mg | Freq: Once | INTRAVENOUS | Status: AC
  Administered 2018-01-10: 30 mg via INTRAVENOUS

## 2018-01-10 MED ORDER — ROCURONIUM BROMIDE 50 MG/5ML IV SOLN
70.0000 mg | Freq: Once | INTRAVENOUS | Status: AC
  Administered 2018-01-10: 70 mg via INTRAVENOUS

## 2018-01-10 MED ORDER — DEXAMETHASONE SODIUM PHOSPHATE 10 MG/ML IJ SOLN
INTRAMUSCULAR | Status: DC | PRN
  Administered 2018-01-10: 10 mg via INTRAVENOUS

## 2018-01-10 MED ORDER — DOCUSATE SODIUM 50 MG/5ML PO LIQD: 100.0000 mg | Freq: Two times a day (BID) | ORAL | Status: DC | PRN

## 2018-01-10 MED ORDER — ORAL CARE MOUTH RINSE
15.0000 mL | OROMUCOSAL | Status: DC
  Administered 2018-01-10 – 2018-01-12 (×23): 15 mL via OROMUCOSAL

## 2018-01-10 MED ORDER — FENTANYL CITRATE (PF) 100 MCG/2ML IJ SOLN
25.0000 ug | INTRAMUSCULAR | Status: DC | PRN
  Administered 2018-01-10 (×2): 25 ug via INTRAVENOUS
  Filled 2018-01-10 (×2): qty 2

## 2018-01-10 MED ORDER — FENTANYL 2500MCG IN NS 250ML (10MCG/ML) PREMIX INFUSION
0.0000 ug/h | INTRAVENOUS | Status: DC
  Administered 2018-01-10: 250 ug/h via INTRAVENOUS
  Administered 2018-01-10: 100 ug/h via INTRAVENOUS
  Administered 2018-01-11: 350 ug/h via INTRAVENOUS
  Administered 2018-01-11 (×2): 400 ug/h via INTRAVENOUS
  Administered 2018-01-11: 35 ug/h via INTRAVENOUS
  Administered 2018-01-12: 200 ug/h via INTRAVENOUS
  Filled 2018-01-10 (×7): qty 250

## 2018-01-10 MED ORDER — ONDANSETRON HCL 4 MG/2ML IJ SOLN
INTRAMUSCULAR | Status: DC | PRN
  Administered 2018-01-10: 4 mg via INTRAVENOUS

## 2018-01-10 MED ORDER — INSULIN ASPART 100 UNIT/ML ~~LOC~~ SOLN
2.0000 [IU] | SUBCUTANEOUS | Status: DC
  Administered 2018-01-10 (×2): 2 [IU] via SUBCUTANEOUS
  Administered 2018-01-10: 4 [IU] via SUBCUTANEOUS
  Administered 2018-01-10: 6 [IU] via SUBCUTANEOUS
  Administered 2018-01-11 (×5): 4 [IU] via SUBCUTANEOUS
  Administered 2018-01-12 (×2): 2 [IU] via SUBCUTANEOUS
  Administered 2018-01-12: 4 [IU] via SUBCUTANEOUS
  Administered 2018-01-13 – 2018-01-14 (×6): 2 [IU] via SUBCUTANEOUS
  Administered 2018-01-14 (×2): 4 [IU] via SUBCUTANEOUS
  Administered 2018-01-14: 6 [IU] via SUBCUTANEOUS
  Administered 2018-01-14 (×3): 2 [IU] via SUBCUTANEOUS
  Administered 2018-01-15 (×2): 4 [IU] via SUBCUTANEOUS
  Administered 2018-01-15: 2 [IU] via SUBCUTANEOUS
  Administered 2018-01-15 – 2018-01-16 (×4): 4 [IU] via SUBCUTANEOUS
  Administered 2018-01-16: 2 [IU] via SUBCUTANEOUS
  Administered 2018-01-16: 4 [IU] via SUBCUTANEOUS
  Administered 2018-01-17: 3 [IU] via SUBCUTANEOUS
  Administered 2018-01-17 – 2018-01-18 (×4): 2 [IU] via SUBCUTANEOUS
  Administered 2018-01-18 (×2): 6 [IU] via SUBCUTANEOUS
  Administered 2018-01-18: 4 [IU] via SUBCUTANEOUS
  Administered 2018-01-19: 6 [IU] via SUBCUTANEOUS
  Administered 2018-01-19: 4 [IU] via SUBCUTANEOUS

## 2018-01-10 MED ORDER — PHENYLEPHRINE HCL-NACL 10-0.9 MG/250ML-% IV SOLN
0.0000 ug/min | INTRAVENOUS | Status: DC
  Filled 2018-01-10: qty 250

## 2018-01-10 MED ORDER — SODIUM CHLORIDE 0.9% IV SOLUTION: Freq: Once | INTRAVENOUS | Status: DC

## 2018-01-10 MED ORDER — SODIUM CHLORIDE 0.9% FLUSH: 10.0000 mL | INTRAVENOUS | Status: DC | PRN

## 2018-01-10 MED ORDER — MIDAZOLAM HCL 2 MG/2ML IJ SOLN
2.0000 mg | INTRAMUSCULAR | Status: DC | PRN
  Administered 2018-01-10 – 2018-01-11 (×4): 2 mg via INTRAVENOUS
  Filled 2018-01-10 (×5): qty 2

## 2018-01-10 MED ORDER — NOREPINEPHRINE 16 MG/250ML-% IV SOLN
0.0000 ug/min | INTRAVENOUS | Status: DC
  Filled 2018-01-10: qty 250

## 2018-01-10 MED ORDER — CHLORHEXIDINE GLUCONATE CLOTH 2 % EX PADS
6.0000 | MEDICATED_PAD | Freq: Every day | CUTANEOUS | Status: DC
  Administered 2018-01-11 – 2018-01-16 (×6): 6 via TOPICAL

## 2018-01-10 MED ORDER — MIDAZOLAM HCL 5 MG/5ML IJ SOLN
INTRAMUSCULAR | Status: DC | PRN
  Administered 2018-01-10: 2 mg via INTRAVENOUS

## 2018-01-10 MED ORDER — CEFAZOLIN SODIUM-DEXTROSE 2-4 GM/100ML-% IV SOLN
2.0000 g | INTRAVENOUS | Status: DC
  Filled 2018-01-10: qty 100

## 2018-01-10 MED ORDER — FENTANYL CITRATE (PF) 250 MCG/5ML IJ SOLN
INTRAMUSCULAR | Status: AC
  Filled 2018-01-10: qty 5

## 2018-01-10 MED ORDER — ORAL CARE MOUTH RINSE: 15.0000 mL | OROMUCOSAL | Status: DC

## 2018-01-10 MED ORDER — "THROMBI-PAD 3""X3"" EX PADS"
1.0000 | MEDICATED_PAD | Freq: Once | CUTANEOUS | Status: AC
  Administered 2018-01-10: 1 via TOPICAL
  Filled 2018-01-10: qty 1

## 2018-01-10 MED ORDER — CHLORHEXIDINE GLUCONATE 0.12% ORAL RINSE (MEDLINE KIT): 15.0000 mL | Freq: Two times a day (BID) | OROMUCOSAL | Status: DC

## 2018-01-10 MED ORDER — VASOPRESSIN 20 UNIT/ML IV SOLN
0.0400 [IU]/min | INTRAVENOUS | Status: DC
  Administered 2018-01-10: 0.04 [IU]/min via INTRAVENOUS
  Filled 2018-01-10: qty 2

## 2018-01-10 MED ORDER — FENTANYL CITRATE (PF) 100 MCG/2ML IJ SOLN
INTRAMUSCULAR | Status: DC | PRN
  Administered 2018-01-10: 100 ug via INTRAVENOUS
  Administered 2018-01-10: 150 ug via INTRAVENOUS

## 2018-01-10 MED ORDER — 0.9 % SODIUM CHLORIDE (POUR BTL) OPTIME
TOPICAL | Status: DC | PRN
  Administered 2018-01-10: 2000 mL

## 2018-01-10 MED ORDER — VITAMIN K1 10 MG/ML IJ SOLN
10.0000 mg | Freq: Once | INTRAMUSCULAR | Status: AC
  Administered 2018-01-10: 10 mg via INTRAVENOUS
  Filled 2018-01-10: qty 1

## 2018-01-10 MED ORDER — CHLORHEXIDINE GLUCONATE 0.12% ORAL RINSE (MEDLINE KIT)
15.0000 mL | Freq: Two times a day (BID) | OROMUCOSAL | Status: DC
  Administered 2018-01-10 – 2018-01-12 (×5): 15 mL via OROMUCOSAL

## 2018-01-10 MED ORDER — IOHEXOL 300 MG/ML  SOLN
100.0000 mL | Freq: Once | INTRAMUSCULAR | Status: AC | PRN
  Administered 2018-01-10: 100 mL via INTRAVENOUS

## 2018-01-10 MED ORDER — SODIUM CHLORIDE 0.9 % IV SOLN
INTRAVENOUS | Status: DC
  Filled 2018-01-10: qty 1

## 2018-01-10 MED ORDER — FENTANYL CITRATE (PF) 100 MCG/2ML IJ SOLN
50.0000 ug | Freq: Once | INTRAMUSCULAR | Status: AC
  Administered 2018-01-10: 50 ug via INTRAVENOUS

## 2018-01-10 MED ORDER — ROCURONIUM BROMIDE 10 MG/ML (PF) SYRINGE
PREFILLED_SYRINGE | INTRAVENOUS | Status: DC | PRN
  Administered 2018-01-10: 50 mg via INTRAVENOUS

## 2018-01-10 MED ORDER — MIDAZOLAM HCL 2 MG/2ML IJ SOLN
INTRAMUSCULAR | Status: AC
  Filled 2018-01-10: qty 2

## 2018-01-10 MED ORDER — HYDROCORTISONE NA SUCCINATE PF 100 MG IJ SOLR
50.0000 mg | Freq: Four times a day (QID) | INTRAMUSCULAR | Status: DC
  Administered 2018-01-10 – 2018-01-14 (×16): 50 mg via INTRAVENOUS
  Filled 2018-01-10 (×17): qty 2

## 2018-01-10 MED ORDER — ALBUMIN HUMAN 25 % IV SOLN
12.5000 g | Freq: Once | INTRAVENOUS | Status: AC
  Administered 2018-01-10: 12.5 g via INTRAVENOUS
  Filled 2018-01-10: qty 50

## 2018-01-10 MED ORDER — SODIUM CHLORIDE 0.9% FLUSH
10.0000 mL | Freq: Two times a day (BID) | INTRAVENOUS | Status: DC
  Administered 2018-01-11 (×2): 10 mL
  Administered 2018-01-12: 20 mL
  Administered 2018-01-12: 10 mL
  Administered 2018-01-13: 30 mL
  Administered 2018-01-13 – 2018-01-21 (×8): 10 mL

## 2018-01-10 MED ORDER — NOREPINEPHRINE 4 MG/250ML-% IV SOLN
0.0000 ug/min | INTRAVENOUS | Status: DC
  Administered 2018-01-10: 09:00:00 via INTRAVENOUS
  Administered 2018-01-10: 40 ug/min via INTRAVENOUS
  Filled 2018-01-10: qty 250

## 2018-01-10 MED ORDER — FENTANYL BOLUS VIA INFUSION
50.0000 ug | INTRAVENOUS | Status: DC | PRN
  Filled 2018-01-10: qty 50

## 2018-01-10 MED ORDER — NOREPINEPHRINE 4 MG/250ML-% IV SOLN: 0.0000 ug/min | INTRAVENOUS | Status: DC

## 2018-01-10 MED ORDER — ACETAMINOPHEN 10 MG/ML IV SOLN
1000.0000 mg | Freq: Once | INTRAVENOUS | Status: AC
  Administered 2018-01-10: 1000 mg via INTRAVENOUS
  Filled 2018-01-10 (×3): qty 100

## 2018-01-10 MED ORDER — NOREPINEPHRINE 16 MG/250ML-% IV SOLN
0.0000 ug/min | INTRAVENOUS | Status: DC
  Administered 2018-01-10: 15 ug/min via INTRAVENOUS
  Administered 2018-01-10: 50 ug/min via INTRAVENOUS
  Administered 2018-01-11: 22 ug/min via INTRAVENOUS
  Administered 2018-01-11: 43 ug/min via INTRAVENOUS
  Administered 2018-01-12: 15 ug/min via INTRAVENOUS
  Filled 2018-01-10 (×6): qty 250

## 2018-01-10 MED ORDER — MIDAZOLAM HCL 2 MG/2ML IJ SOLN: 2.0000 mg | INTRAMUSCULAR | Status: DC | PRN

## 2018-01-10 MED ORDER — SODIUM CHLORIDE 0.9% IV SOLUTION
Freq: Once | INTRAVENOUS | Status: AC
  Administered 2018-01-10: 02:00:00 via INTRAVENOUS

## 2018-01-10 MED ORDER — MIDAZOLAM HCL 2 MG/2ML IJ SOLN
INTRAMUSCULAR | Status: AC
  Administered 2018-01-10: 2 mg
  Filled 2018-01-10: qty 2

## 2018-01-10 MED ORDER — VANCOMYCIN HCL 10 G IV SOLR
2500.0000 mg | Freq: Once | INTRAVENOUS | Status: AC
  Administered 2018-01-10: 2500 mg via INTRAVENOUS
  Filled 2018-01-10: qty 2500

## 2018-01-10 SURGICAL SUPPLY — 57 items
ADH SKN CLS LQ APL DERMABOND (GAUZE/BANDAGES/DRESSINGS) ×1
APL SKNCLS STERI-STRIP NONHPOA (GAUZE/BANDAGES/DRESSINGS) ×1
ATTRACTOMAT 16X20 MAGNETIC DRP (DRAPES) ×1 IMPLANT
BENZOIN TINCTURE PRP APPL 2/3 (GAUZE/BANDAGES/DRESSINGS) ×2 IMPLANT
CANISTER SUCT 3000ML PPV (MISCELLANEOUS) ×2 IMPLANT
CATH THORACIC 28FR (CATHETERS) IMPLANT
CATH THORACIC 28FR RT ANG (CATHETERS) IMPLANT
CATH THORACIC 36FR (CATHETERS) IMPLANT
CATH THORACIC 36FR RT ANG (CATHETERS) IMPLANT
CONN ST 1/4X3/8  BEN (MISCELLANEOUS) ×1
CONN ST 1/4X3/8 BEN (MISCELLANEOUS) IMPLANT
CONT SPEC 4OZ CLIKSEAL STRL BL (MISCELLANEOUS) ×1 IMPLANT
COVER SURGICAL LIGHT HANDLE (MISCELLANEOUS) ×2 IMPLANT
DERMABOND ADHESIVE PROPEN (GAUZE/BANDAGES/DRESSINGS) ×1
DERMABOND ADVANCED .7 DNX6 (GAUZE/BANDAGES/DRESSINGS) IMPLANT
DRAIN CHANNEL 28F RND 3/8 FF (WOUND CARE) ×2 IMPLANT
DRAPE CARDIOVASCULAR INCISE (DRAPES) ×2
DRAPE LAPAROSCOPIC ABDOMINAL (DRAPES) ×1 IMPLANT
DRAPE SRG 135X102X78XABS (DRAPES) IMPLANT
ELECT BLADE 4.0 EZ CLEAN MEGAD (MISCELLANEOUS) ×4
ELECT REM PT RETURN 9FT ADLT (ELECTROSURGICAL) ×4
ELECTRODE BLDE 4.0 EZ CLN MEGD (MISCELLANEOUS) IMPLANT
ELECTRODE REM PT RTRN 9FT ADLT (ELECTROSURGICAL) ×1 IMPLANT
GAUZE SPONGE 4X4 12PLY STRL (GAUZE/BANDAGES/DRESSINGS) ×1 IMPLANT
GAUZE SPONGE 4X4 12PLY STRL LF (GAUZE/BANDAGES/DRESSINGS) ×1 IMPLANT
GLOVE BIOGEL PI IND STRL 6 (GLOVE) IMPLANT
GLOVE BIOGEL PI IND STRL 6.5 (GLOVE) IMPLANT
GLOVE BIOGEL PI INDICATOR 6 (GLOVE) ×1
GLOVE BIOGEL PI INDICATOR 6.5 (GLOVE) ×2
GLOVE ORTHO TXT STRL SZ7.5 (GLOVE) ×4 IMPLANT
GOWN STRL REUS W/ TWL XL LVL3 (GOWN DISPOSABLE) ×1 IMPLANT
GOWN STRL REUS W/TWL XL LVL3 (GOWN DISPOSABLE) ×6
HEMOSTAT POWDER SURGIFOAM 1G (HEMOSTASIS) IMPLANT
KIT BASIN OR (CUSTOM PROCEDURE TRAY) ×2 IMPLANT
KIT TURNOVER KIT B (KITS) ×2 IMPLANT
NS IRRIG 1000ML POUR BTL (IV SOLUTION) ×3 IMPLANT
PACK CHEST (CUSTOM PROCEDURE TRAY) ×1 IMPLANT
PACK OPEN HEART (CUSTOM PROCEDURE TRAY) ×1 IMPLANT
PAD ARMBOARD 7.5X6 YLW CONV (MISCELLANEOUS) ×4 IMPLANT
PAD ELECT DEFIB RADIOL ZOLL (MISCELLANEOUS) ×2 IMPLANT
STRIP CLOSURE SKIN 1/2X4 (GAUZE/BANDAGES/DRESSINGS) ×1 IMPLANT
SUT MNCRL AB 3-0 PS2 18 (SUTURE) ×2 IMPLANT
SUT PDS AB 1 CTX 36 (SUTURE) ×1 IMPLANT
SUT SILK 1 TIES 10X30 (SUTURE) ×1 IMPLANT
SUT VIC AB 1 CTX 18 (SUTURE) ×2 IMPLANT
SUT VIC AB 2-0 CT1 36 (SUTURE) ×1 IMPLANT
SUT VIC AB 3-0 SH 8-18 (SUTURE) ×2 IMPLANT
SWAB COLLECTION DEVICE MRSA (MISCELLANEOUS) IMPLANT
SWAB CULTURE ESWAB REG 1ML (MISCELLANEOUS) IMPLANT
SYR 50ML SLIP (SYRINGE) IMPLANT
SYSTEM SAHARA CHEST DRAIN ATS (WOUND CARE) ×1 IMPLANT
TAPE CLOTH SURG 4X10 WHT LF (GAUZE/BANDAGES/DRESSINGS) ×1 IMPLANT
TOWEL GREEN STERILE (TOWEL DISPOSABLE) ×2 IMPLANT
TOWEL GREEN STERILE FF (TOWEL DISPOSABLE) ×2 IMPLANT
TRAP SPECIMEN MUCOUS 40CC (MISCELLANEOUS) ×3 IMPLANT
TRAY FOLEY MTR SLVR 14FR STAT (SET/KITS/TRAYS/PACK) ×1 IMPLANT
WATER STERILE IRR 1000ML POUR (IV SOLUTION) ×4 IMPLANT

## 2018-01-10 NOTE — Progress Notes (Signed)
RT set up BIPAP and placed on patient. Patient is tolerating well at this time. RT will monitor as needed.

## 2018-01-10 NOTE — Progress Notes (Signed)
Dr Jimmey Ralph notified of Lactic acid-10.9. Will continue to monitor.

## 2018-01-10 NOTE — Progress Notes (Signed)
Patient took bipap off and told bedside nurse to place the "other" mask back on him. Sats are 98%. Will continue to monitor.

## 2018-01-10 NOTE — Progress Notes (Signed)
Pharmacy Antibiotic Note  [name] is a 67 y.o. male admitted on 01/09/2018 with sepsis.  Pharmacy has been consulted for Vancocin and Zosyn dosing.  Plan: Vancomycin 2500mg  IV x1 then 1000mg  with each HD.  Goal pre-HD level 15-25 mcg/mL. Zosyn 3.375g IV every 12 hours (4-hour infusion).  Height: 6\' 3"  (190.5 cm) Weight: 245 lb 6 oz (111.3 kg) IBW/kg (Calculated) : 84.5  Temp (24hrs), Avg:97.6 F (36.4 C), Min:97.5 F (36.4 C), Max:97.6 F (36.4 C)  Recent Labs  Lab 01/09/18 1528 01/09/18 1532 01/09/18 2009  WBC  --  7.1  --   CREATININE  --  6.16*  --   LATICACIDVEN 5.49*  --  10.29*    Estimated Creatinine Clearance: 16.3 mL/min (A) (by C-G formula based on SCr of 6.16 mg/dL (H)).    No Known Allergies  Thank you for allowing pharmacy to be a part of this patient's care.  Wynona Neat, PharmD, BCPS  01/10/2018 12:04 AM

## 2018-01-10 NOTE — Brief Op Note (Addendum)
01/09/2018 - 01/10/2018  9:57 AM  PATIENT:  [name]  67 y.o. male  PRE-OPERATIVE DIAGNOSIS:  pericardial effusion  POST-OPERATIVE DIAGNOSIS:  pericardial effusion  PROCEDURE:  Procedure(s): SUBXYPHOID PERICARDIAL WINDOW (N/A)  SURGEON:  Surgeon(s) and Role:    Rexene Alberts, MD - Primary  PHYSICIAN ASSISTANT: WAYNE GOLD PA-C  ANESTHESIA:   general  EBL:  MINIMAL   BLOOD ADMINISTERED:6 FFP  DRAINS: (1 2 F) Blake drain(s) in the PERICARDIUM   LOCAL MEDICATIONS USED:  NONE  SPECIMEN:  Source of Specimen:  PERICARDIAL EFFUSION AND PEERICARDIAL BX  DISPOSITION OF SPECIMEN:  PATH AND MICRO  COUNTS:  YES  TOURNIQUET:  * No tourniquets in log *  DICTATION: .Dragon Dictation  PLAN OF CARE: Admit to inpatient   PATIENT DISPOSITION:  ICU - intubated and hemodynamically stable.   Delay start of Pharmacological VTE agent (>24hrs) due to surgical blood loss or risk of bleeding: yes  FINDINGS: [place]  Rexene Alberts, MD 01/10/2018

## 2018-01-10 NOTE — Progress Notes (Signed)
Dr. Jimmey Ralph notified of troponin of 0.11. Continuing to monitor.

## 2018-01-10 NOTE — Op Note (Signed)
CARDIOTHORACIC SURGERY OPERATIVE NOTE  Date of Procedure:   01/10/2018  Preoperative Diagnosis:  Pericardial Effusion with Pericardial Tamponade  Postoperative Diagnosis:  same  Procedure:    [place] Pericardial Window  Surgeon:    Valentina Gu. Roxy Manns, MD  Assistant:    John Giovanni, PA-C  Anesthesia:    Suella Broad, MD  Operative Findings:   Large, hemorrhagic pericardial effusion with pericardial tamponade     BRIEF CLINICAL NOTE AND INDICATIONS FOR SURGERY  Patient is a 67 year old African-American male with history of end-stage renal disease on hemodialysis, obstructive sleep apnea, peripheral vascular disease, hypertension, diabetes mellitus, GE reflux disease, chronic anemia, chronic combined systolic and diastolic congestive heart failure, lower extremity DVT on long-term warfarin anticoagulation, and non-Hodgkin's lymphoma treated with chemotherapy in 2009 who presented emergently with hypotension, shortness of breath, and abdominal pain and has been found to have a large pericardial effusion with pericardial tamponade complicated by acute respiratory failure and shock.    The patient reportedly was brought via EMS to the emergency room at [facility] January 09, 2018 complaining that he had felt poorly since he underwent dialysis on January 08, 2018.  The patient was notably hypotensive when EMS arrived and was given fluid bolus.  Hypotension persisted requiring initiation of intravenous levophed.  Portable chest x-ray revealed questionable left lower lobe infiltrate and the patient was started on intravenous vancomycin and Zosyn for possible sepsis.  Abdominal CT scan revealed a large pericardial effusion.  There was some gallbladder wall thickening and pericholecystic fluid as well as some other signs of inflammation possibly consistent with pancreatitis.  The patient was transferred to [facility] where a limited echocardiogram revealed a large  pericardial effusion with pericardial tamponade.  Initially plans were made for pericardiocentesis in the cardiac Cath Lab.  The patient was administered fresh frozen plasma to reverse anticoagulation with warfarin.  The patient acutely decompensated with respiratory distress and was intubated early this morning.  Later this morning the decision was made to contact cardiothoracic surgery and emergent surgical consultation was requested.    DETAILS OF THE OPERATIVE PROCEDURE  The patient is brought to the operating room on the above mentioned date and placed in the supine position on the operating table. Pneumatic sequential compression boots are placed on both lower extremities. General endotracheal anesthesia is monitored under the care and direction of Dr. Smith Robert. The patient's anterior chest abdomen and both groins are prepared and draped in a sterile manner.  A time out procedure is performed. A small vertical incision is made in the midline beginning at the xiphoid process and extending towards the umbilicus. The incision is completed through the subcutaneous tissue and linea alba using electrocautery. The left body of rectus abdominis muscle is retracted in a cephalad direction and the subjacent pre-peritoneal fat and fascia retracted inferiorly until the anterior surface of the pericardium was exposed. A small incision is made in the pericardium. A large amount of bloody fluid is immediately evacuated. Portions of the fluid are trapped to be sent for routine laboratory data, culture, and cytology. A total of 1200 mL of fluid is evacuated. Transesophageal echocardiogram was performed confirming evacuation of the majority of the pericardial effusion. The pericardial space is drained with a single 28 French Bard chest tube placed through a separate stab incision. The abdominal fascia is closed in the midline using interrupted #1 Vicryl suture. Subcutaneous tissues are closed in layers and the skin is  closed with a running subcuticular  skin closure.  The patient tolerated the procedure well and hemodynamics immediately recovered following evacuation of the pericardial effusion.  All pressors were discontinued.  Of note, the patient's oxygen saturations deteriorated towards the end of the procedure, suggestive of acute pulmonary edema.  Estimated blood loss was trivial. There are no intraoperative complications. All sponge instrument and needle counts are verified correct at completion the operation.  The patient was transported back to the cardiovascular intensive care unit intubated, sedated, in stable hemodynamic condition.  The patient should not be considered candidate for pharmacologic anticoagulation for any reason at this time.     Valentina Gu. Roxy Manns MD 01/10/2018 9:52 AM

## 2018-01-10 NOTE — Consult Note (Signed)
[facility] Consult Note   Primary Physician:  Burnard Bunting, MD Primary Cardiologist:   Will [name], MD  Reason for Consultation: Large pericardial effusion  HPI:    [name] is seen today for evaluation of a large pericardial effusion at the request of Dr. Jimmey Ralph. The patient is a 67 year old gentleman with a past medical history significant for SVT/atrial fibrillation, ESRD on HD (last HD on 01/08/18), OSA, PVD, NHL (2009), DM, HTN, GERD, Anemia, non-ischemic cardiomyopathy (EF 20%), LE DVT (remote 58yrs ago per pt) on Coumadin who is being evaluated for diffuse abdominal pain and hypotension. On this admission, on abdominal imaging (CT scan), an incidental finding of a large pericardial effusion was made.   He underwent a cardiac catheterization on 09/11/16 for an abnormal stress test. He had widely patent coronary arteries with severe LV dysfunction and an EF of 20%. An echocardiogram on 08/05/16 documented severely reduced EF (15-20%) with diffuse hypokinesis. PA pressures were elevated (peak pressure 45 mmHg).  He denies any chest pain, shortness of breath, orthopnea or leg edema. Of note his INR is 6.62. He is on coumadin.  The patient is on Levophed and broad spectrum antibiotics for a presumed diagnosis of septic shock.    Home Medications Prior to Admission medications   Medication Sig Start Date End Date Taking? Authorizing Provider  acetaminophen (TYLENOL) 325 MG tablet Take 650 mg by mouth every 6 (six) hours as needed (pain).   Yes [provider]  ergocalciferol (VITAMIN D2) 50000 units capsule Take 50,000 Units by mouth once a week. Monday   Yes [provider]  insulin aspart (NOVOLOG) 100 UNIT/ML injection Inject 10 Units into the skin 3 (three) times daily before meals.    Yes [provider]  insulin glargine (LANTUS) 100 UNIT/ML injection Inject 0.15 mLs (15 Units total) into the skin at bedtime. 03/17/16  Yes Theodis Blaze, MD  isosorbide-hydrALAZINE (BIDIL) 20-37.5 MG tablet Take 1 tablet by mouth 3 (three) times daily. 10/14/16  Yes Camnitz, Will Hassell Done, MD  lidocaine-prilocaine (EMLA) cream Apply 1 application topically as needed (Apply small amount to access site 1-2 hours before dialysis. Cover with occlusive dressing (saran wrap)).    Yes [provider]  metoprolol succinate (TOPROL-XL) 25 MG 24 hr tablet Take 1 tablet (25 mg total) by mouth daily. Take with or immediately following a meal. 09/09/16  Yes Camnitz, Ocie Doyne, MD  multivitamin (RENA-VIT) TABS tablet Take 1 tablet by mouth at bedtime. 08/08/13  Yes Burnard Bunting, MD  polysaccharide iron (NIFEREX) 150 MG CAPS capsule Take 1 capsule (150 mg total) by mouth daily. 01/16/11  Yes Isaac Bliss, Rayford Halsted, MD  sevelamer carbonate (RENVELA) 800 MG tablet Take 1,600 mg by mouth 3 (three) times daily with meals.   Yes [provider]  warfarin (COUMADIN) 5 MG tablet Take 7.5-10 mg by mouth daily. Take 7.5 mg on Sun / Tues / Wed / Fri / Sat  Take 10 mg on Mon / Thurs   Yes [provider]    Past Medical History: Past Medical History:  Diagnosis Date  . Anemia   . Arthritis    HNP- lumbar, "all over my body"  . Blood transfusion    "years ago; blood was low" (08/05/2013)  . CKD (chronic kidney disease) stage 4, GFR 15-29 ml/min (HCC) 03/18/2012   [place]- T,TH,Sat.  . Diabetic nephropathy ([place])   . Diabetic retinopathy   . DVT (  deep venous thrombosis) ([place])    "got one in my right leg now; I've had one before too, not sure which leg" (08/05/2013)  . ESRD (end stage renal disease) on dialysis [facility])    "just started today, (08/04/2013)"  . Family history of anesthesia complication    " my son wakes up slowly"  . GERD (gastroesophageal reflux disease)    uses alka seltzere on occas.   [name])    "one q now and then" (08/05/2013)  . Hyperlipidemia   . Hypertension   . IDDM (insulin  dependent diabetes mellitus) (HCC)    Type 2  . 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 Non Hodgkin's lymphoma ([place])    Tx 2009; "had chemo; it went away" (08/05/2013)  . Noncompliance 03/16/2012  . NSVT (nonsustained ventricular tachycardia) ([place]) 03/18/2012  . Peripheral vascular disease ([place])   . Pneumonia 2013   hosp.-   . Poor historian    pt. unsure of several answers to health history questions   . Skin cancer    melanoma - head  . Sleep apnea    "suppose to have a sleep study, but they never told me when. (08/05/2013)    Past Surgical History: Past Surgical History:  Procedure Laterality Date  . ACHILLES TENDON SURGERY Right 03/13/2016   Procedure: ACHILLES LENGTHENING/KIDNER;  Surgeon: Edrick Kins, DPM;  Location: [place];  Service: Podiatry;  Laterality: Right;  . AV FISTULA PLACEMENT Left 02/03/2013   Procedure: ARTERIOVENOUS (AV) FISTULA CREATION- LEFT RADIAL CEPHALIC; ULTRASOUND GUIDED;  Surgeon: Mal Misty, MD;  Location: [facility] OR;  Service: Vascular;  Laterality: Left;  . AV FISTULA PLACEMENT Right 11/08/2015   Procedure: RIGHT BRACHIOCEPHALIC ARTERIOVENOUS (AV) FISTULA CREATION;  Surgeon: Serafina Mitchell, MD;  Location: [place];  Service: Vascular;  Laterality: Right;  . BASCILIC VEIN TRANSPOSITION Right 01/24/2016   Procedure: RIGHT SECOND STAGE BASILIC VEIN TRANSPOSITION;  Surgeon: Angelia Mould, MD;  Location: [place];  Service: Vascular;  Laterality: Right;  . CARDIAC CATHETERIZATION    . COLONOSCOPY N/A 09/23/2015   Procedure: COLONOSCOPY;  Surgeon: Irene Shipper, MD;  Location: [facility];  Service: Endoscopy;  Laterality: N/A;  . Coloscopy    . EYE SURGERY Bilateral   . GAS INSERTION  05/10/2012   Procedure: INSERTION OF GAS;  Surgeon: Hayden Pedro, MD;  Location: [place];  Service: Ophthalmology;  Laterality: Right;  . LEFT HEART CATH AND CORONARY ANGIOGRAPHY N/A 09/11/2016   Procedure: Left Heart Cath and Coronary Angiography;  Surgeon:  Belva Crome, MD;  Location: [place] [facility];  Service: Cardiovascular;  Laterality: N/A;  . LESION EXCISION Right 05/08/2015   Procedure: EXCISION SCALP LESION;  Surgeon: Erroll Luna, MD;  Location: [place];  Service: General;  Laterality: Right;  . MEMBRANE PEEL  05/10/2012   Procedure: MEMBRANE PEEL;  Surgeon: Hayden Pedro, MD;  Location: [place];  Service: Ophthalmology;  Laterality: Right;  . PARS PLANA VITRECTOMY  08/27/2011   Procedure: PARS PLANA VITRECTOMY WITH 25 GAUGE;  Surgeon: Hayden Pedro, MD;  Location: [place];  Service: Ophthalmology;  Laterality: Left;  Repair of complex traction retinal detachment left eye  . PARS PLANA VITRECTOMY  05/10/2012   Procedure: PARS PLANA VITRECTOMY WITH 25 GAUGE;  Surgeon: Hayden Pedro, MD;  Location: [place];  Service: Ophthalmology;  Laterality: Right;  Repair Complex Traction Retinal Detachment  . PHOTOCOAGULATION WITH LASER  05/10/2012   Procedure: PHOTOCOAGULATION WITH  LASER;  Surgeon: Hayden Pedro, MD;  Location: [place];  Service: Ophthalmology;  Laterality: Right;  . PORT-A-CATH REMOVAL    . PORTACATH PLACEMENT    . TRANSMETATARSAL AMPUTATION Right 03/13/2016   Procedure: TRANSMETATARSAL AMPUTATION;  Surgeon: Edrick Kins, DPM;  Location: [place];  Service: Podiatry;  Laterality: Right;  . VENA CAVA FILTER PLACEMENT  09/2012   due to preparation for surgery    Family History: Family History  Problem Relation Age of Onset  . Anesthesia problems Son   . Hypertension Son   . Diabetes Father   . Hypertension Father   . Other Father        amputation  . Colon cancer Neg Hx     Social History: Social History   Socioeconomic History  . Marital status: Single    Spouse name: Not on file  . Number of children: 1  . Years of education: Not on file  . Highest education level: Not on file  Occupational History  . 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 Financial resource strain: Not on file  . Food  insecurity:    Worry: Not on file    Inability: Not on file  . Transportation needs:    Medical: Not on file    Non-medical: Not on file  Tobacco Use  . Smoking status: Never Smoker  . Smokeless tobacco: Never Used  Substance and Sexual Activity  . Alcohol use: No    Alcohol/week: 0.0 standard drinks  . Drug use: No  . Sexual activity: Not Currently  Lifestyle  . Physical activity:    Days per week: Not on file    Minutes per session: Not on file  . Stress: Not on file  Relationships  . Social connections:    Talks on phone: Not on file    Gets together: Not on file    Attends religious service: Not on file    Active member of club or organization: Not on file    Attends meetings of clubs or organizations: Not on file    Relationship status: Not on file  Other Topics Concern  . Not on file  Social History Narrative  . Not on file    Allergies:  No Known Allergies  Objective:    Vital Signs:   Temp:  [97.5 F (36.4 C)-98.4 F (36.9 C)] 98.4 F (36.9 C) (08/12 0254) Pulse Rate:  [38-130] 123 (08/12 0327) Resp:  [15-38] 19 (08/12 0327) BP: (65-135)/(40-90) 97/76 (08/12 0327) SpO2:  [68 %-100 %] 68 % (08/12 0327) Weight:  [111.1 kg-111.3 kg] 111.3 kg (08/11 2105) Last BM Date: 01/07/18  Weight change: Filed Weights   01/09/18 1501 01/09/18 2105  Weight: 111.1 kg 111.3 kg    Intake/Output:   Intake/Output Summary (Last 24 hours) at 01/10/2018 0339 Last data filed at 01/10/2018 0300 Gross per 24 hour  Intake 2928.15 ml  Output -  Net 2928.15 ml      Physical Exam    General:  Appears ill. No resp difficulty HEENT: normal Neck: supple. JVP . Carotids 2+ bilat; no bruits. No lymphadenopathy or thyromegaly appreciated. Cor: PMI diffusely appreciated. Tachycardiac, No rubs, gallops or murmurs. Lungs: Decrease breath sounds at the bases Abdomen: soft, nondistended. No hepatosplenomegaly. No bruits or masses.  Extremities: no cyanosis, clubbing, rash,  edema Neuro: alert & orientedx3, cranial nerves grossly intact. moves all 4 extremities w/o difficulty.  Affect pleasant    EKG  01/09/2018: atrial fibrillation, rate 124 bpm, RBBB, T wave abnormalities (personally reviewed)   Labs   Basic Metabolic Panel: Recent Labs  Lab 01/09/18 1532 01/09/18 2247  NA 138 129*  K 4.8 5.5*  CL 96* 86*  CO2 25 17*  GLUCOSE 238* 487*  BUN 25* 28*  CREATININE 6.16* 7.04*  CALCIUM 8.8* 8.7*    Liver Function Tests: Recent Labs  Lab 01/09/18 1532 01/09/18 2247  AST 109* 434*  ALT 93* 418*  ALKPHOS 158* 157*  BILITOT 1.6* 2.0*  PROT 8.0 7.3  ALBUMIN 3.2* 2.7*   Recent Labs  Lab 01/09/18 1532  LIPASE 25   No results for input(s): AMMONIA in the last 168 hours.  CBC: Recent Labs  Lab 01/09/18 1532 01/09/18 2247  WBC 7.1 12.1*  NEUTROABS 5.3 10.3*  HGB 10.8* 10.1*  HCT 34.0* 34.0*  MCV 94.2 99.1  PLT 207 219    Cardiac Enzymes: Recent Labs  Lab 01/09/18 2217  TROPONINI 0.11*    BNP: BNP (last 3 results) Recent Labs    01/09/18 1532  BNP 276.0*    ProBNP (last 3 results) No results for input(s): PROBNP in the last 8760 hours.   CBG: Recent Labs  Lab 01/09/18 1719 01/09/18 2106 01/10/18 0224  GLUCAP 200* 190* 223*    Coagulation Studies: Recent Labs    01/09/18 1532 01/09/18 2217  LABPROT 57.3* 83.2*  INR 6.62* >10.00*     Imaging   Abd 1 View - Result Date: 01/09/2018 FINDINGS: Scattered large and small bowel gas is noted. IVC filter is noted in place. Diffuse vascular calcifications are seen. No acute bony abnormality is noted. IMPRESSION: No acute abnormality seen.   CT Abdomen Pelvis W Contrast - Result Date: 01/10/2018 FINDINGS: Lower chest: Small left pleural effusion. Consolidation in the left lung base may be due to atelectasis or pneumonia. Less prominent atelectasis or infiltration in the right lung base. Large pericardial effusion. Heart size is normal. Hepatobiliary: Diffuse  fatty infiltration of the liver. There is a large stone in the gallbladder. Gallbladder wall is thickened and there is edema around the gallbladder suggesting cholecystitis. No bile duct dilatation. Pancreas: Hazy outline of the pancreas particularly around the head with infiltration in the peripancreatic fat. Changes are consistent with acute pancreatitis. No loculated collections identified. Spleen: Normal in size without focal abnormality. Adrenals/Urinary Tract: Adrenal glands are unremarkable. Kidneys are normal, without renal calculi, focal lesion, or hydronephrosis. Bladder is decompressed. Stomach/Bowel: Stomach, small bowel, and colon are not abnormally distended. No wall thickening is identified although under distention limits evaluation of bowel wall. No inflammatory changes suggested. Appendix is normal. Vascular/Lymphatic: Aortic atherosclerosis. Prominent vascular calcifications throughout the abdomen and pelvis. No enlarged abdominal or pelvic lymph nodes. Inferior vena caval filter present. Reproductive: Prostate is unremarkable. Other: No free air or free fluid in the abdomen. Small periumbilical hernias containing fat. Lipoma in the left inguinal canal. Musculoskeletal: Degenerative changes in the spine. Bridging anterior osteophytes. Disc space narrowing with endplate sclerosis and erosion demonstrated at the lumbosacral inter space. This was present on a previous study from 03/10/2016 although there is progression. The previous study demonstrated degenerative changes with Schmorl's nodes period changes today likely also represent degenerative change although discitis is not entirely excluded. Consider MRI for further evaluation if clinically indicated. IMPRESSION: 1. Cholelithiasis with gallbladder wall thickening and pericholecystic edema suggesting cholecystitis. 2. Infiltration in the peripancreatic fat consistent with acute pancreatitis. No loculated collections. 3. Diffuse fatty  infiltration of the liver.  4. Small left pleural effusion. Consolidation in the left lung base may be due to atelectasis or pneumonia. Less prominent atelectasis or infiltration in the right lung base. 5. Large pericardial effusion. 6. Small periumbilical hernias containing fat. Lipoma in the left inguinal canal. 7. Disc space narrowing and erosion at the lumbosacral junction. This was present on the previous study from 2017 and likely represents degenerative change although progression is shown. Consider MRI for further evaluation if clinically indicated. 8. Aortic atherosclerosis with extensive vascular calcifications.   Chest Portable X Ray 1 View - Result Date: 01/09/2018 FINDINGS: Stable cardiac enlargement and aortic atherosclerosis. Significantly diminished lung volumes. Mild pulmonary edema suspected. Left lower lobe airspace opacity is unchanged from previous exam. IMPRESSION: 1. No change in aeration to the left lung base compared with previous exam. 2. Suspect mild CHF.  US Abdomen - Result Date: 01/10/2018 FINDINGS: Gallbladder: Large stone in the gallbladder measuring 2.2 cm diameter. Diffusely thickened gallbladder wall measuring up to 8 mm. Murphy's sign is negative. Common bile duct: Diameter: 4.9 mm, normal. Liver: No focal lesion identified. Within normal limits in parenchymal echogenicity. Portal vein is patent on color Doppler imaging with normal direction of blood flow towards the liver. IMPRESSION: Cholelithiasis with diffuse gallbladder wall thickening. Negative Murphy's sign. Changes are nonspecific but may indicate acute cholecystitis in the appropriate clinical setting.  Echocardiogram 08/05/2016 - Left ventricle: The cavity size was mildly dilated. There was   mild concentric hypertrophy. Systolic function was severely   reduced. The estimated ejection fraction was in the range of   15-20%. Diffuse hypokinesis. The study was not technically   sufficient to allow evaluation of LV  diastolic dysfunction due to   atrial fibrillation. - Aortic valve: Trileaflet; normal thickness leaflets. There was no   regurgitation. - Aortic root: The aortic root was normal in size. - Mitral valve: There was mild regurgitation. - Left atrium: The atrium was severely dilated. - Right ventricle: The cavity size was mildly dilated. Wall   thickness was normal. Systolic function was severely reduced. - Right atrium: The atrium was moderately dilated. - Tricuspid valve: There was moderate regurgitation. - Pulmonary arteries: Systolic pressure was mildly increased. PA   peak pressure: 45 mm Hg (S). - Inferior vena cava: The vessel was dilated. The respirophasic   diameter changes were blunted (< 50%), consistent with elevated   central venous pressure. - Pericardium, extracardiac: There was no pericardial effusion.   Assessment/Plan   1. Large circumferential pericardial effusion  - Clinically the patient is hypotensive and requiring Levophed. - The pulse pressure is still wide (113/81) and the neck veins are not distended. - An Urgent bedside transthoracic echocardiogram was performed with the following findings:  Pericardial effusion: A moderate to large circumferential pericardial effusion is present RA and RV diastolic collapse is present LA: Not well visualized LV: Dilated, globally hypokinetic, EF 20% Mitral and tricuspid flow variation could not be measured since portable echo does not allow for measurement of Doppler velocities.  - Maintain large bore IV access for fluids if patient has worsening hypotension - Formal transthoracic echocardiogram in the morning - Will continue to assess for need for possible pericardiocentesis if hemodynamically unstable   Discussed case with Dr. Claiborne Billings (on-call Interventional Attending)   Meade Maw, MD  01/10/2018, 3:39 AM  Cardiology Overnight Team Please contact [facility] for night-coverage after hours (4p -7a ) and  weekends on [email or web address]

## 2018-01-10 NOTE — Progress Notes (Signed)
Cards fellow called my personal cell phone and informed me that the TTE was read and reviewed by Dr Claiborne Billings and that it has a circumferential effusion with RV collapse and that the patient is "physiologically in tamponade." He wants the INR improved prior to placing a drain. He said ideally INR less than 4. Currently INR 5.21 following 10mg  Vit K x 2 doses and 2 units FFP. An additional 2u pRBC have been ordered. Will recheck INR following transfusion of those.

## 2018-01-10 NOTE — Procedures (Signed)
Endotracheal Intubation Procedure Note Indication for endotracheal intubation: impending respiratory failure Sedation: etomidate, fentanyl and midazolam Paralytic: rocuronium Equipment: Macintosh 3 laryngoscope blade and 7.44mm cuffed endotracheal tube Cricoid Pressure: yes Number of attempts: 1 ETT location confirmed by by auscultation, by CXR and ETCO2 monitor.

## 2018-01-10 NOTE — Consult Note (Signed)
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    The patient reportedly was brought via EMS to the emergency room at [facility] January 09, 2018 complaining that he had felt poorly since he underwent dialysis on January 08, 2018.  The patient was notably hypotensive when EMS arrived and was given fluid bolus.  Hypotension persisted requiring initiation of intravenous levophed.  Portable chest x-ray revealed questionable left lower lobe infiltrate and the patient was started on intravenous vancomycin and Zosyn for possible sepsis.  Abdominal CT scan revealed a large pericardial effusion.  There was some gallbladder wall thickening and pericholecystic fluid as well as some other signs of inflammation possibly consistent with pancreatitis.  The patient was transferred to [facility] where a limited echocardiogram revealed a large pericardial effusion with pericardial tamponade.  Initially plans were made for pericardiocentesis in the cardiac Cath Lab.  The patient was administered fresh frozen plasma to reverse  anticoagulation with warfarin.  The patient acutely decompensated with respiratory distress and was intubated early this morning.  Later this morning the decision was made to contact cardiothoracic surgery and emergent surgical consultation was requested.  The patient reportedly lives alone.  His son is at the bedside.  The patient's son states that the patient had remained reasonably active physically and was in the early process of undergoing evaluation for possible kidney transplant.  Remainder of review of systems is unobtainable at this time and can only be evaluated by review of previous diagnostic interviews.  Past Medical History:  Diagnosis Date  . Anemia   . Arthritis    HNP- lumbar, "all over my body"  . Blood transfusion    "years ago; blood was low" (08/05/2013)  . CKD (chronic kidney disease) stage 4, GFR 15-29 ml/min (HCC) 03/18/2012   [place]- T,TH,Sat.  . Diabetic nephropathy ([place])   . Diabetic retinopathy   . DVT (deep venous thrombosis) ([place])    "got one in my right leg now; I've had one before too, not sure which leg" (08/05/2013)  . ESRD (end stage renal disease) on dialysis [facility])    "just started today, (08/04/2013)"  . Family history of anesthesia complication    " my son wakes up slowly"  . GERD (gastroesophageal reflux disease)    uses alka seltzere on occas.   [name])    "one q now and then" (08/05/2013)  . Hyperlipidemia   . Hypertension   . 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 Non Hodgkin's lymphoma ([place])    Tx 2009; "had chemo;  it went away" (08/05/2013)  . Noncompliance 03/16/2012  . NSVT (nonsustained ventricular tachycardia) ([place]) 03/18/2012  . Peripheral vascular disease ([place])   . Pneumonia 2013   hosp.-   . Poor historian    pt. unsure of several answers to health history questions   . Skin cancer    melanoma - head  . Sleep apnea    "suppose  to have a sleep study, but they never told me when. (08/05/2013)    Past Surgical History:  Procedure Laterality Date  . ACHILLES TENDON SURGERY Right 03/13/2016   Procedure: ACHILLES LENGTHENING/KIDNER;  Surgeon: Edrick Kins, DPM;  Location: [place];  Service: Podiatry;  Laterality: Right;  . AV FISTULA PLACEMENT Left 02/03/2013   Procedure: ARTERIOVENOUS (AV) FISTULA CREATION- LEFT RADIAL CEPHALIC; ULTRASOUND GUIDED;  Surgeon: Mal Misty, MD;  Location: [facility] OR;  Service: Vascular;  Laterality: Left;  . AV FISTULA PLACEMENT Right 11/08/2015   Procedure: RIGHT BRACHIOCEPHALIC ARTERIOVENOUS (AV) FISTULA CREATION;  Surgeon: Serafina Mitchell, MD;  Location: [place];  Service: Vascular;  Laterality: Right;  . BASCILIC VEIN TRANSPOSITION Right 01/24/2016   Procedure: RIGHT SECOND STAGE BASILIC VEIN TRANSPOSITION;  Surgeon: Angelia Mould, MD;  Location: [place];  Service: Vascular;  Laterality: Right;  . CARDIAC CATHETERIZATION    . COLONOSCOPY N/A 09/23/2015   Procedure: COLONOSCOPY;  Surgeon: Irene Shipper, MD;  Location: [facility];  Service: Endoscopy;  Laterality: N/A;  . Coloscopy    . EYE SURGERY Bilateral   . GAS INSERTION  05/10/2012   Procedure: INSERTION OF GAS;  Surgeon: Hayden Pedro, MD;  Location: [place];  Service: Ophthalmology;  Laterality: Right;  . LEFT HEART CATH AND CORONARY ANGIOGRAPHY N/A 09/11/2016   Procedure: Left Heart Cath and Coronary Angiography;  Surgeon: Belva Crome, MD;  Location: [place] CV LAB;  Service: Cardiovascular;  Laterality: N/A;  . LESION EXCISION Right 05/08/2015   Procedure: EXCISION SCALP LESION;  Surgeon: Erroll Luna, MD;  Location: [place];  Service: General;  Laterality: Right;  . MEMBRANE PEEL  05/10/2012   Procedure: MEMBRANE PEEL;  Surgeon: Hayden Pedro, MD;  Location: [place];  Service: Ophthalmology;  Laterality: Right;  . PARS PLANA VITRECTOMY  08/27/2011   Procedure: PARS PLANA VITRECTOMY WITH 25 GAUGE;  Surgeon: Hayden Pedro, MD;   Location: [place];  Service: Ophthalmology;  Laterality: Left;  Repair of complex traction retinal detachment left eye  . PARS PLANA VITRECTOMY  05/10/2012   Procedure: PARS PLANA VITRECTOMY WITH 25 GAUGE;  Surgeon: Hayden Pedro, MD;  Location: [place];  Service: Ophthalmology;  Laterality: Right;  Repair Complex Traction Retinal Detachment  . PHOTOCOAGULATION WITH LASER  05/10/2012   Procedure: PHOTOCOAGULATION WITH LASER;  Surgeon: Hayden Pedro, MD;  Location: [place];  Service: Ophthalmology;  Laterality: Right;  . PORT-A-CATH REMOVAL    . PORTACATH PLACEMENT    . TRANSMETATARSAL AMPUTATION Right 03/13/2016   Procedure: TRANSMETATARSAL AMPUTATION;  Surgeon: Edrick Kins, DPM;  Location: [place];  Service: Podiatry;  Laterality: Right;  . VENA CAVA FILTER PLACEMENT  09/2012   due to preparation for surgery    Family History  Problem Relation Age of Onset  . Anesthesia problems Son   . Hypertension Son   . Diabetes Father   . Hypertension Father   . Other Father        amputation  . Colon cancer Neg Hx     Social History   Socioeconomic History  .  Marital status: Single    Spouse name: Not on file  . Number of children: 1  . Years of education: Not on file  . Highest education level: Not on file  Occupational History  . 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 Financial resource strain: Not on file  . Food insecurity:    Worry: Not on file    Inability: Not on file  . Transportation needs:    Medical: Not on file    Non-medical: Not on file  Tobacco Use  . Smoking status: Never Smoker  . Smokeless tobacco: Never Used  Substance and Sexual Activity  . Alcohol use: No    Alcohol/week: 0.0 standard drinks  . Drug use: No  . Sexual activity: Not Currently  Lifestyle  . Physical activity:    Days per week: Not on file    Minutes per session: Not on file  . Stress: Not on file  Relationships  . Social connections:    Talks on phone: Not on file     Gets together: Not on file    Attends religious service: Not on file    Active member of club or organization: Not on file    Attends meetings of clubs or organizations: Not on file    Relationship status: Not on file  . Intimate partner violence:    Fear of current or ex partner: Not on file    Emotionally abused: Not on file    Physically abused: Not on file    Forced sexual activity: Not on file  Other Topics Concern  . Not on file  Social History Narrative  . Not on file    Prior to Admission medications   Medication Sig Start Date End Date Taking? Authorizing Provider  acetaminophen (TYLENOL) 325 MG tablet Take 650 mg by mouth every 6 (six) hours as needed (pain).   Yes [provider]  ergocalciferol (VITAMIN D2) 50000 units capsule Take 50,000 Units by mouth once a week. Monday   Yes [provider]  insulin aspart (NOVOLOG) 100 UNIT/ML injection Inject 10 Units into the skin 3 (three) times daily before meals.    Yes [provider]  insulin glargine (LANTUS) 100 UNIT/ML injection Inject 0.15 mLs (15 Units total) into the skin at bedtime. 03/17/16  Yes Theodis Blaze, MD  isosorbide-hydrALAZINE (BIDIL) 20-37.5 MG tablet Take 1 tablet by mouth 3 (three) times daily. 10/14/16  Yes Camnitz, Will Hassell Done, MD  lidocaine-prilocaine (EMLA) cream Apply 1 application topically as needed (Apply small amount to access site 1-2 hours before dialysis. Cover with occlusive dressing (saran wrap)).    Yes [provider]  metoprolol succinate (TOPROL-XL) 25 MG 24 hr tablet Take 1 tablet (25 mg total) by mouth daily. Take with or immediately following a meal. 09/09/16  Yes Camnitz, Ocie Doyne, MD  multivitamin (RENA-VIT) TABS tablet Take 1 tablet by mouth at bedtime. 08/08/13  Yes Burnard Bunting, MD  polysaccharide iron (NIFEREX) 150 MG CAPS capsule Take 1 capsule (150 mg total) by mouth daily. 01/16/11  Yes Isaac Bliss, Rayford Halsted, MD  sevelamer carbonate  (RENVELA) 800 MG tablet Take 1,600 mg by mouth 3 (three) times daily with meals.   Yes [provider]  warfarin (COUMADIN) 5 MG tablet Take 7.5-10 mg by mouth daily. Take 7.5 mg on Sun / Tues / Wed / Fri / Sat  Take 10 mg on Mon / Thurs   Yes [provider]    Current Facility-Administered Medications  Medication Dose Route Frequency Provider Last Rate Last Dose  . 0.9 %  sodium chloride infusion (Manually program via Guardrails IV Fluids)   Intravenous Once Anders Simmonds, MD      . 0.9 %  sodium chloride infusion (Manually program via Guardrails IV Fluids)   Intravenous Once Hilty, Nadean Corwin, MD      . 0.9 %  sodium chloride infusion  250 mL Intravenous PRN Omar Person, NP      . 0.9 %  sodium chloride infusion  250 mL Intravenous PRN Hammonds, Sharyn Blitz, MD 10 mL/hr at 01/10/18 0500 250 mL at 01/10/18 0500  . 0.9 %  sodium chloride infusion   Intravenous Continuous Hammonds, Sharyn Blitz, MD 10 mL/hr at 01/10/18 [id number]    . ceFAZolin (ANCEF) IVPB 2g/100 mL premix  2 g Intravenous 30 min Pre-Op Rexene Alberts, MD      . chlorhexidine gluconate (MEDLINE KIT) (PERIDEX) 0.12 % solution 15 mL  15 mL Mouth Rinse BID Hammonds, Sharyn Blitz, MD      . docusate (COLACE) 50 MG/5ML liquid 100 mg  100 mg Per Tube BID PRN Hammonds, Sharyn Blitz, MD      . fentaNYL (SUBLIMAZE) bolus via infusion 50 mcg  50 mcg Intravenous Q1H PRN Hammonds, Sharyn Blitz, MD      . fentaNYL (SUBLIMAZE) injection 25 mcg  25 mcg Intravenous Q2H PRN Hammonds, Sharyn Blitz, MD   25 mcg at 01/10/18 0530  . fentaNYL 2572mg in NS 2524m(1035mml) infusion-PREMIX  0-400 mcg/hr Intravenous Continuous Hammonds, KatSharyn BlitzD 10 mL/hr at 01/10/18 0700 100 mcg/hr at 01/10/18 0700  . insulin aspart (novoLOG) injection 2-6 Units  2-6 Units Subcutaneous Q4H Hammonds, KatSharyn BlitzD   6 Units at 01/10/18 0802  . MEDLINE mouth rinse  15 mL Mouth Rinse 10 times per day Hammonds, KatSharyn BlitzD      . midazolam (VERSED)  injection 2 mg  2 mg Intravenous Q15 min PRN Hammonds, KatSharyn BlitzD      . midazolam (VERSED) injection 2 mg  2 mg Intravenous Q2H PRN Hammonds, KatSharyn BlitzD      . norepinephrine (LEVOPHED) 4mg27m D5W 250mL21mmix infusion  0-80 mcg/min Intravenous Titrated Hammonds, KathlSharyn Blitz150 mL/hr at 01/10/18 0716 40 mcg/min at 01/10/18 0716  . ondansetron (ZOFRAN) injection 4 mg  4 mg Intravenous Q6H PRN EubanOmar Person     . pantoprazole (PROTONIX) injection 40 mg  40 mg Intravenous Q12H Hammonds, KathlSharyn Blitz  40 mg at 01/10/18 0030  . phenylephrine (NEOSYNEPHRINE) 10-0.9 MG/250ML-% infusion  0-400 mcg/min Intravenous Titrated Hilty, KenneNadean Corwin     . piperacillin-tazobactam (ZOSYN) IVPB 3.375 g  3.375 g Intravenous Q12H Coffee, GarreDonna Christen 12.5 mL/hr at 01/10/18 0700    . vasopressin (PITRESSIN) 40 Units in sodium chloride 0.9 % 250 mL (0.16 Units/mL) infusion  0.04 Units/min Intravenous Continuous Hammonds, KathlSharyn Blitz15 mL/hr at 01/10/18 0703 0.04 Units/min at 01/10/18 0703    No Known Allergies    Review of Systems:  Per HPI     Physical Exam:   BP 100/71   Pulse (!) 109   Temp 98.8 F (37.1 C) (Axillary)   Resp (!) 24   Ht 6' 3" (1.905 m)   Wt 111.3 kg   SpO2 96%   BMI 30.67 kg/m   General:  Moderately obese, intubated, sedated  on vent  HEENT:  Unremarkable   Neck:   + JVD, no bruits, no adenopathy   Chest:   clear to auscultation, symmetrical breath sounds, no wheezes, no rhonchi   CV:   Distant heart sounds, RRR, no obvious murmur   Abdomen:  soft, non-distended  Extremities:  Cool, poor perfusion  Rectal/GU  Deferred  Neuro:   Intubated and heavily sedated on vent  Skin:   Clean and dry, no rashes, no breakdown  Diagnostic Tests:  Lab Results: Recent Labs    01/09/18 2247 01/10/18 0354  WBC 12.1* 14.4*  HGB 10.1* 10.4*  HCT 34.0* 34.3*  PLT 219 234   BMET:  Recent Labs    01/09/18 2247 01/10/18 0354  NA 129* 136  K 5.5* 5.5*    CL 86* 92*  CO2 17* 17*  GLUCOSE 487* 255*  BUN 28* 33*  CREATININE 7.04* 7.24*  CALCIUM 8.7* 8.4*    CBG (last 3)  Recent Labs    01/10/18 0224 01/10/18 0405 01/10/18 0757  GLUCAP 223* 197* 203*   PT/INR:   Recent Labs    01/10/18 0633  LABPROT 38.0*  INR 3.91    CXR:  PORTABLE CHEST 1 VIEW  COMPARISON:  01/10/2018 chest radiograph.  FINDINGS: Left central venous catheter tip is stable projecting over lower SVC. Endotracheal tube tip projects 5.3 cm above the carina. Enteric tube tip extends below the field of view. Stable cardiomegaly and calcific aortic atherosclerosis. Stable left lower lobe airspace opacity and probable superimposed pulmonary edema.  IMPRESSION: 1. Endotracheal tube tip 5.3 cm above carina. 2. Enteric tube tip below field of view in the abdomen. 3. Stable left central venous catheter. 4. Stable left basilar opacity, probable pulmonary edema, cardiomegaly, aortic atherosclerosis.   Electronically Signed   By: Kristine Garbe [name]   On: 01/10/2018 06:46   CT ABDOMEN AND PELVIS WITH CONTRAST  TECHNIQUE: Multidetector CT imaging of the abdomen and pelvis was performed using the standard protocol following bolus administration of intravenous contrast.  CONTRAST:  175m OMNIPAQUE IOHEXOL 300 MG/ML  SOLN  COMPARISON:  None.  FINDINGS: Lower chest: Small left pleural effusion. Consolidation in the left lung base may be due to atelectasis or pneumonia. Less prominent atelectasis or infiltration in the right lung base. Large pericardial effusion. Heart size is normal.  Hepatobiliary: Diffuse fatty infiltration of the liver. There is a large stone in the gallbladder. Gallbladder wall is thickened and there is edema around the gallbladder suggesting cholecystitis. No bile duct dilatation.  Pancreas: Hazy outline of the pancreas particularly around the head with infiltration in the peripancreatic fat. Changes are  consistent with acute pancreatitis. No loculated collections identified.  Spleen: Normal in size without focal abnormality.  Adrenals/Urinary Tract: Adrenal glands are unremarkable. Kidneys are normal, without renal calculi, focal lesion, or hydronephrosis. Bladder is decompressed.  Stomach/Bowel: Stomach, small bowel, and colon are not abnormally distended. No wall thickening is identified although under distention limits evaluation of bowel wall. No inflammatory changes suggested. Appendix is normal.  Vascular/Lymphatic: Aortic atherosclerosis. Prominent vascular calcifications throughout the abdomen and pelvis. No enlarged abdominal or pelvic lymph nodes. Inferior vena caval filter present.  Reproductive: Prostate is unremarkable.  Other: No free air or free fluid in the abdomen. Small periumbilical hernias containing fat. Lipoma in the left inguinal canal.  Musculoskeletal: Degenerative changes in the spine. Bridging anterior osteophytes. Disc space narrowing with endplate sclerosis and erosion demonstrated at the lumbosacral inter space. This was present on a previous  study from 03/10/2016 although there is progression. The previous study demonstrated degenerative changes with Schmorl's nodes period changes today likely also represent degenerative change although discitis is not entirely excluded. Consider MRI for further evaluation if clinically indicated.  IMPRESSION: 1. Cholelithiasis with gallbladder wall thickening and pericholecystic edema suggesting cholecystitis. 2. Infiltration in the peripancreatic fat consistent with acute pancreatitis. No loculated collections. 3. Diffuse fatty infiltration of the liver. 4. Small left pleural effusion. Consolidation in the left lung base may be due to atelectasis or pneumonia. Less prominent atelectasis or infiltration in the right lung base. 5. Large pericardial effusion. 6. Small periumbilical hernias containing  fat. Lipoma in the left inguinal canal. 7. Disc space narrowing and erosion at the lumbosacral junction. This was present on the previous study from 2017 and likely represents degenerative change although progression is shown. Consider MRI for further evaluation if clinically indicated. 8. Aortic atherosclerosis with extensive vascular calcifications.   Electronically Signed   By: Lucienne Capers [name]   On: 01/10/2018 02:28   Transthoracic Echocardiography  Patient:    Charlton, Boule MR #:       [id number] Study Date: 01/10/2018 Gender:     M Age:        43 Height:     190.5 cm Weight:     113 kg BSA:        2.47 m^2 Pt. Status: Room:       [facility]   PERFORMING   [facility], Inpatient  ATTENDING    Reyne Dumas  ORDERING     Reyne Dumas  REFERRING    Reyne Dumas  ADMITTING    Tarry Kos  SONOGRAPHER  Madelaine Etienne  cc:  ------------------------------------------------------------------- LV EF: 20%  ------------------------------------------------------------------- Indications:      Tachycardia 785.0. STAT limited echo for tamponade.  ------------------------------------------------------------------- Study Conclusions  - HPI and indications: STAT limited echo for tamponade. - Left ventricle: Small LV cavity. Moderate to severe LVH. The   estimated ejection fraction was 20%. Diffuse hypokinesis. - Right ventricle: RV appears underfilled- diastolic collapse is   noted. - Inferior vena cava: The vessel was dilated. The respirophasic   diameter changes were blunted (< 50%), consistent with elevated   central venous pressure. - Pericardium, extracardiac: Large pericardial effusion - measuring   up to 2 cm at the apex. Features are highly suggestive of   tamponade physiology.  Urgent and Critical Findings:   A result from an emergently ordered study, cardiac tamponade, was reported to Dr. Jimmey Ralph , by Dr. Debara Pickett , on 2019  , at 07:10 AM. Impressions:  - Echo demonstrates large circumferential pericardial effusion   suggestive of tamponade physiology.  Recommendations:  Urgent pericardiocentesis is recommended.  ------------------------------------------------------------------- Study data:   Study status:  Emergent.  Procedure:  Transthoracic echocardiography. Image quality was adequate.  Study completion: There were no complications.          Transthoracic echocardiography.  M-mode, limited 2D, limited spectral Doppler, and color Doppler.  Birthdate:  Patient birthdate: 1951/03/11. Age:  Patient is 67 yr old.  Sex:  Gender: male.    BMI: 31.1 kg/m^2.  Blood pressure:     84/69  Patient status:  Inpatient. Study date:  Study date: 01/10/2018. Study time: 04:07 AM. Location:  ICU/CCU  -------------------------------------------------------------------  ------------------------------------------------------------------- Left ventricle:  Small LV cavity. Moderate to severe LVH. The estimated ejection fraction was 20%. Diffuse hypokinesis.   ------------------------------------------------------------------- Right ventricle:  RV appears underfilled- diastolic collapse is noted.  -------------------------------------------------------------------  Pericardium:  Large pericardial effusion - measuring up to 2 cm at the apex. Features are highly suggestive of tamponade physiology.   ------------------------------------------------------------------- Systemic veins: Inferior vena cava: The vessel was dilated. The respirophasic diameter changes were blunted (< 50%), consistent with elevated central venous pressure.  ------------------------------------------------------------------- Prepared and Electronically Authenticated by  Lyman Bishop MD 2019-08-12T07:11:32   Impression:  I have personally reviewed the patient's chart, examined the patient, and reviewed his radiographic studies and  echocardiogram.  He has a large pericardial effusion with pericardial tamponade complicated by cardiogenic shock and acute respiratory failure requiring intubation for mechanical ventilation.  He needs emergent surgical drainage.  The significance of the other radiographic findings noted on the patient's CT scan will need to be addressed postoperatively, including whether or not the patient may have cholecystitis and/or pancreatitis.   Plan:  I have discussed the nature of the patient's current tenuous circumstances at length with the patient's son at the bedside including the presence of large pericardial effusion with clear evidence of pericardial tamponade.  Need for emergent surgical drainage was discussed.  Potential risks and benefits of surgery were discussed.  All questions answered.  We plan to proceed directly to the operating room.   I spent in excess of 60 minutes during the conduct of this hospital consultation and >50% of this time involved direct face-to-face encounter for counseling and/or coordination of the patient's care.    Valentina Gu. Roxy Manns, MD 01/10/2018 8:32 AM

## 2018-01-10 NOTE — Anesthesia Postprocedure Evaluation (Signed)
Anesthesia Post Note  Patient: [name]  Procedure(s) Performed: SUBXYPHOID PERICARDIAL WINDOW (N/A Chest)     Patient location during evaluation: SICU Anesthesia Type: General Level of consciousness: sedated Pain management: pain level controlled Vital Signs Assessment: post-procedure vital signs reviewed and stable Respiratory status: patient remains intubated per anesthesia plan Cardiovascular status: stable Postop Assessment: no apparent nausea or vomiting Anesthetic complications: no    Last Vitals:  Vitals:   01/10/18 1045 01/10/18 1100  BP: (!) 68/47   Pulse: 91 (!) 105  Resp:  (!) 32  Temp:    SpO2:  98%    Last Pain:  Vitals:   01/10/18 0800  TempSrc: Axillary  PainSc:                  [name]

## 2018-01-10 NOTE — Progress Notes (Signed)
Patient back from CT. Second unit of FFP's started. Will continue to  Monitor.

## 2018-01-10 NOTE — Transfer of Care (Signed)
Immediate Anesthesia Transfer of Care Note  Patient: [name]  Procedure(s) Performed: SUBXYPHOID PERICARDIAL WINDOW (N/A Chest)  Patient Location: ICU  Anesthesia Type:General  Level of Consciousness: unresponsive and Patient remains intubated per anesthesia plan  Airway & Oxygen Therapy: Patient remains intubated per anesthesia plan and Patient placed on Ventilator (see vital sign flow sheet for setting)  Post-op Assessment: Report given to RN and Post -op Vital signs reviewed and stable  Post vital signs: Reviewed and stable  Last Vitals:  Vitals Value Taken Time  BP 78/58 01/10/2018 10:33 AM  Temp    Pulse 91 01/10/2018 10:42 AM  Resp 21 01/10/2018 10:43 AM  SpO2 85 % 01/10/2018 10:42 AM  Vitals shown include unvalidated device data.  Last Pain:  Vitals:   01/10/18 0800  TempSrc: Axillary  PainSc:       Patients Stated Pain Goal: 0 (72/90/21 1155)  Complications: No apparent anesthesia complications

## 2018-01-10 NOTE — Progress Notes (Signed)
TCTS BRIEF SICU PROGRESS NOTE  Day of Surgery  S/P Procedure(s) (LRB): SUBXYPHOID PERICARDIAL WINDOW (N/A)   Waking up on vent Remains in Afib HR 100-110 Hypotensive again, now back on escalating pressors Minimal chest tube drainage Abdomen soft, mildly tender but no obvious peritoneal signs, guarding  Assessment:  S/P emergency pericardial window with initial improvement in blood pressure.  No signs of bleeding and chest tube output minimal.  Now w/ persistent hypotension worrisome for possible sepsis.  Previous CT suggestive of possible cholecystitis and/or pancreatitis  Plan: Per Pulm/CCM team and General Surgery.    Rexene Alberts, MD 01/10/2018 5:31 PM

## 2018-01-10 NOTE — Progress Notes (Signed)
Pt c/o that he cant breath on 8L [place], sats of 98%. Pt placed on non-rebreather. Sats are 100%  Will continue to monitor.

## 2018-01-10 NOTE — Progress Notes (Signed)
D/w Dr. Tamala Julian in the cath lab - concern over continued coagulopathy about risk of pericardiocentesis. I will give additional 2U FFP now. D/w Dr. Roxy Manns in [place] surgery, will evaluate for emergent pericardial window. Updated son at the bedside of his condition.  Pixie Casino, MD, [facility], [place] Director of the Advanced Lipid Disorders &  Cardiovascular Risk Reduction Clinic Diplomate of the American Board of Clinical Lipidology Attending Cardiologist  Direct Dial: [phone]  Fax: [phone]  Website:  [email or web address]

## 2018-01-10 NOTE — Plan of Care (Signed)
Patient is intubated and sedated so he is unable to participate in the progression of this care at this time. Patient remains on vasopressors and fentanyl, he is unable to communicate at this time. He does respond to pain and to some verbal stimulus. Family was present but has stepped out to get some lunch and will return later this afternoon.

## 2018-01-10 NOTE — Progress Notes (Addendum)
Septic shock worsening.  I am concerned about ischemic bowel while the patient was hypotensive.  Call general surgery to evaluate on consultation.  Start stress dose steroids.  Broad spectrum abx  Vent adjusted and f/u ABG improved  The patient is critically ill with multiple organ systems failure and requires high complexity decision making for assessment and support, frequent evaluation and titration of therapies, application of advanced monitoring technologies and extensive interpretation of multiple databases.   Critical Care Time devoted to patient care services described in this note is  34  Minutes. This time reflects time of care of this signee Dr Jennet Maduro. This critical care time does not reflect procedure time, or teaching time or supervisory time of PA/NP/Med student/Med Resident etc but could involve care discussion time.  Rush Farmer, [name] [facility] Pulmonary/Critical Care Medicine. Pager: [phone]. After hours pager: [phone].

## 2018-01-10 NOTE — Progress Notes (Signed)
[facility] notified of INR 5.21 . Will continue to monitor.

## 2018-01-10 NOTE — Progress Notes (Signed)
CT Abdomen shows large pericardial effusion. Concern that patient is in tamponade. Placed STAT consult to Cards fellow Dr Paticia Stack @ 2:36am. He says he will come see the patient but that he is unable to do an echo as he broke his hand. He asked me to call the Cards attending for approval to call in echo tech. He was unable to call the cards attending or echo tech himself. Amion did not list a cards attending on call for general cards, only listing Dr Claiborne Billings on call as the STEMI attending. I called Dr Claiborne Billings, and he gave approval for calling in the echo tech. I then called the operator and paged the echo tech Abigail Butts who said she needs an attending to agree to read the echo before she will come in. She says it cannot be a fellow. She plans to call Dr Claiborne Billings back to figure out who will read this imaging study.

## 2018-01-10 NOTE — H&P (Signed)
PULMONARY / CRITICAL CARE MEDICINE   Name: [name] MRN: [id number] DOB: May 05, 1951    ADMISSION DATE:  01/09/2018 CONSULTATION DATE: 01/09/18  REFERRING MD: Transfer from [place]: Shock  HISTORY OF PRESENT ILLNESS:   66yoM with hx ESRD on HD (last HD on 01/08/18), OSA, PVD, NHL (2009), DM, HTN, GERD, Anemia, CHF (EF 20%), LE DVT (remote 85yrs ago per pt) on Coumadin, who presented to [place] c/o "feeling bad" since dialysis on 8/10. When EMS was called, they found him to have BP 80/30 which improved to 100/30 following 500cc IVF bolus. In the OSH ER, patient again became hypotensive requiring initiation of levophed via a PIV. CXR showed a LLL infiltrate, for which he was started on Vanc and Zosyn then transferred to [facility] ICU.   On arrival, patient c/o 10/10 severe diffuse abdominal pain that has been constant x 1 day; also c/o nonbloody N/V. Denies diarrhea, CP, SOB, Cough, F/C. He is currently writhing in bed and dry heaving at time of my exam. Levophed @ 72mcg via a PIV.   SUBJECTIVE:  Patient decompensated overnight and was intubated then taken to the OR for pericardial window placement  VITAL SIGNS: BP (!) 88/58   Pulse (!) 109   Temp 98.8 F (37.1 C) (Axillary)   Resp (!) 24   Ht 6\' 3"  (1.905 m)   Wt 111.3 kg   SpO2 96%   BMI 30.67 kg/m   HEMODYNAMICS: CVP:  [18 mmHg-19 mmHg] 19 mmHgLevophed @ 63mcg  INTAKE / OUTPUT: I/O last 3 completed shifts: In: 4098 [I.V.:1906.3; Blood:1024; IV Piggyback:1329.7] Out: -   PHYSICAL EXAMINATION: General: Acutely ill appearing male, NAD Neuro: Sedate, unresponsive, paralyzed from OR HEENT: [place]/AT, pupils are sluggish with MMM Cardiovascular: RRR, Nl S1/S2 and -M/R/G Lungs: CTA bilaterally Abdomen: Soft, NT, ND and +BS Musculoskeletal: no LE edema; Right foot with toe amputation Skin: no rashes   LABS:  BMET Recent Labs  Lab 01/09/18 1532 01/09/18 2247 01/10/18 0354  01/10/18 1011  NA 138 129* 136 133*  K 4.8 5.5* 5.5* 4.2  CL 96* 86* 92*  --   CO2 25 17* 17*  --   BUN 25* 28* 33*  --   CREATININE 6.16* 7.04* 7.24*  --   GLUCOSE 238* 487* 255*  --    Electrolytes Recent Labs  Lab 01/09/18 1532 01/09/18 2247 01/10/18 0354  CALCIUM 8.8* 8.7* 8.4*  MG  --   --  2.2  PHOS  --   --  8.0*   CBC Recent Labs  Lab 01/09/18 1532 01/09/18 2247 01/10/18 0354 01/10/18 1011  WBC 7.1 12.1* 14.4*  --   HGB 10.8* 10.1* 10.4* 9.5*  HCT 34.0* 34.0* 34.3* 28.0*  PLT 207 219 234  --    Coag's Recent Labs  Lab 01/09/18 2217 01/10/18 0358 01/10/18 0633  INR >10.00* 5.21* 3.91   Sepsis Markers Recent Labs  Lab 01/09/18 1532  01/09/18 2217 01/10/18 0354 01/10/18 0713  LATICACIDVEN  --    < > 10.9* 10.9* 12.7*  PROCALCITON 1.44  --   --  3.04  --    < > = values in this interval not displayed.   ABG Recent Labs  Lab 01/09/18 2330 01/10/18 0316 01/10/18 1011  PHART 7.208* 7.543* 7.134*  PCO2ART 42.4 <15.0* 46.6  PO2ART 19.0* 43.0* 310.0*    Liver Enzymes Recent Labs  Lab 01/09/18 1532 01/09/18 2247  AST 109* 434*  ALT  93* 418*  ALKPHOS 158* 157*  BILITOT 1.6* 2.0*  ALBUMIN 3.2* 2.7*   Cardiac Enzymes Recent Labs  Lab 01/09/18 2217 01/10/18 0354  TROPONINI 0.11* 0.33*    Glucose Recent Labs  Lab 01/09/18 1719 01/09/18 2106 01/10/18 0224 01/10/18 0405 01/10/18 0757  GLUCAP 200* 190* 223* 197* 203*   Imaging Dg Chest 2 View  Result Date: 01/09/2018 CLINICAL DATA:  Dialysis treatment today. Patient not feeling well since that time. EXAM: CHEST - 2 VIEW COMPARISON:  March 05, 2016 FINDINGS: Stable cardiomegaly. Increased opacity in left retrocardiac region may represent atelectasis or infiltrate. This is best seen on the lateral view. The hila and mediastinum are normal. No pneumothorax. No pulmonary nodules or masses. IMPRESSION: Left retrocardiac opacity could represent atelectasis or infiltrate. Recommend clinical  correlation and follow-up to resolution. Electronically Signed   By: Dorise Bullion III M.D   On: 01/09/2018 16:08   Dg Abd 1 View  Result Date: 01/10/2018 CLINICAL DATA:  Orogastric tube placement EXAM: ABDOMEN - 1 VIEW COMPARISON:  CT from earlier the same day FINDINGS: Enteric tube has been advanced into the stomach which is nondilated. Visualized bowel gas pattern normal. IVC filter at the L2 level. IMPRESSION: Enteric tube to the gastric fundus.  Normal bowel gas pattern. Electronically Signed   By: Lucrezia Europe [name]   On: 01/10/2018 08:00   Dg Abd 1 View  Result Date: 01/09/2018 CLINICAL DATA:  Abdominal pain EXAM: ABDOMEN - 1 VIEW COMPARISON:  None. FINDINGS: Scattered large and small bowel gas is noted. IVC filter is noted in place. Diffuse vascular calcifications are seen. No acute bony abnormality is noted. IMPRESSION: No acute abnormality seen. Electronically Signed   By: Inez Catalina [name]   On: 01/09/2018 23:13   Ct Abdomen Pelvis W Contrast  Result Date: 01/10/2018 CLINICAL DATA:  Abdominal pain, sepsis, hypotension. Severe acute abdominal pain and shock. History of end-stage renal disease on hemodialysis. EXAM: CT ABDOMEN AND PELVIS WITH CONTRAST TECHNIQUE: Multidetector CT imaging of the abdomen and pelvis was performed using the standard protocol following bolus administration of intravenous contrast. CONTRAST:  1109mL OMNIPAQUE IOHEXOL 300 MG/ML  SOLN COMPARISON:  None. FINDINGS: Lower chest: Small left pleural effusion. Consolidation in the left lung base may be due to atelectasis or pneumonia. Less prominent atelectasis or infiltration in the right lung base. Large pericardial effusion. Heart size is normal. Hepatobiliary: Diffuse fatty infiltration of the liver. There is a large stone in the gallbladder. Gallbladder wall is thickened and there is edema around the gallbladder suggesting cholecystitis. No bile duct dilatation. Pancreas: Hazy outline of the pancreas particularly around the  head with infiltration in the peripancreatic fat. Changes are consistent with acute pancreatitis. No loculated collections identified. Spleen: Normal in size without focal abnormality. Adrenals/Urinary Tract: Adrenal glands are unremarkable. Kidneys are normal, without renal calculi, focal lesion, or hydronephrosis. Bladder is decompressed. Stomach/Bowel: Stomach, small bowel, and colon are not abnormally distended. No wall thickening is identified although under distention limits evaluation of bowel wall. No inflammatory changes suggested. Appendix is normal. Vascular/Lymphatic: Aortic atherosclerosis. Prominent vascular calcifications throughout the abdomen and pelvis. No enlarged abdominal or pelvic lymph nodes. Inferior vena caval filter present. Reproductive: Prostate is unremarkable. Other: No free air or free fluid in the abdomen. Small periumbilical hernias containing fat. Lipoma in the left inguinal canal. Musculoskeletal: Degenerative changes in the spine. Bridging anterior osteophytes. Disc space narrowing with endplate sclerosis and erosion demonstrated at the lumbosacral inter space. This was present on  a previous study from 03/10/2016 although there is progression. The previous study demonstrated degenerative changes with Schmorl's nodes period changes today likely also represent degenerative change although discitis is not entirely excluded. Consider MRI for further evaluation if clinically indicated. IMPRESSION: 1. Cholelithiasis with gallbladder wall thickening and pericholecystic edema suggesting cholecystitis. 2. Infiltration in the peripancreatic fat consistent with acute pancreatitis. No loculated collections. 3. Diffuse fatty infiltration of the liver. 4. Small left pleural effusion. Consolidation in the left lung base may be due to atelectasis or pneumonia. Less prominent atelectasis or infiltration in the right lung base. 5. Large pericardial effusion. 6. Small periumbilical hernias  containing fat. Lipoma in the left inguinal canal. 7. Disc space narrowing and erosion at the lumbosacral junction. This was present on the previous study from 2017 and likely represents degenerative change although progression is shown. Consider MRI for further evaluation if clinically indicated. 8. Aortic atherosclerosis with extensive vascular calcifications. Electronically Signed   By: Lucienne Capers [name]   On: 01/10/2018 02:28   Portable Chest X-ray  Result Date: 01/10/2018 CLINICAL DATA:  68 y/o  M; ET tube placement. EXAM: PORTABLE CHEST 1 VIEW COMPARISON:  01/10/2018 chest radiograph. FINDINGS: Left central venous catheter tip is stable projecting over lower SVC. Endotracheal tube tip projects 5.3 cm above the carina. Enteric tube tip extends below the field of view. Stable cardiomegaly and calcific aortic atherosclerosis. Stable left lower lobe airspace opacity and probable superimposed pulmonary edema. IMPRESSION: 1. Endotracheal tube tip 5.3 cm above carina. 2. Enteric tube tip below field of view in the abdomen. 3. Stable left central venous catheter. 4. Stable left basilar opacity, probable pulmonary edema, cardiomegaly, aortic atherosclerosis. Electronically Signed   By: Kristine Garbe [name]   On: 01/10/2018 06:46   Dg Chest Port 1 View  Result Date: 01/10/2018 CLINICAL DATA:  Status post central line placement EXAM: PORTABLE CHEST 1 VIEW COMPARISON:  01/09/2018 FINDINGS: Left jugular central line is now seen at the cavoatrial junction. No pneumothorax is noted. Cardiac shadow is enlarged but stable. Left basilar atelectatic changes are again noted and stable. IMPRESSION: No pneumothorax following central line placement. The remainder of the exam is stable. Electronically Signed   By: Inez Catalina [name]   On: 01/10/2018 00:26   Dg Chest Port 1 View  Result Date: 01/09/2018 CLINICAL DATA:  Evaluate pneumonia. EXAM: PORTABLE CHEST 1 VIEW COMPARISON:  01/09/2018. FINDINGS: Stable  cardiac enlargement and aortic atherosclerosis. Significantly diminished lung volumes. Mild pulmonary edema suspected. Left lower lobe airspace opacity is unchanged from previous exam. IMPRESSION: 1. No change in aeration to the left lung base compared with previous exam. 2. Suspect mild CHF. Aortic Atherosclerosis (ICD10-I70.0). Electronically Signed   By: Kerby Moors [name]   On: 01/09/2018 23:14   US Abdomen Limited Ruq  Result Date: 01/10/2018 CLINICAL DATA:  Abdominal pain. Abnormal CT scan. CT 01/11/2008 demonstrated cholelithiasis with suggestion of cholecystitis. Pancreatitis. EXAM: ULTRASOUND ABDOMEN LIMITED RIGHT UPPER QUADRANT COMPARISON:  CT abdomen and pelvis 01/10/2018 FINDINGS: Gallbladder: Large stone in the gallbladder measuring 2.2 cm diameter. Diffusely thickened gallbladder wall measuring up to 8 mm. Murphy's sign is negative. Common bile duct: Diameter: 4.9 mm, normal. Liver: No focal lesion identified. Within normal limits in parenchymal echogenicity. Portal vein is patent on color Doppler imaging with normal direction of blood flow towards the liver. IMPRESSION: Cholelithiasis with diffuse gallbladder wall thickening. Negative Murphy's sign. Changes are nonspecific but may indicate acute cholecystitis in the appropriate clinical setting. Electronically Signed  By: Lucienne Capers [name]   On: 01/10/2018 03:35   STUDIES:  CT Abdomen/Pelvis w IV contrast (8/12): ordered   CULTURES: Blood culture (8/11): pending Sputum culture (8/11): ordered  UA (8/11): ordered  ANTIBIOTICS: Vanc 8/11>> Zosyn 8/11>>  SIGNIFICANT EVENTS: 8/11: presented to [facility] ER with abd pain, shock, N/V >> diagnosed with pneumonia and transferred to [facility] ICU  LINES/TUBES: RUE AV fistula Left hand PIV [facility] TLC 8/12>>  DISCUSSION: 66yoM with hx ESRD on HD (last HD on 01/08/18), OSA, PVD, NHL (2009), DM, HTN, GERD, Anemia, CHF (EF 20%), LE DVT (remote 53yrs ago per pt) on Coumadin, who  presented to [place] c/o "feeling bad" since dialysis on 8/10. When EMS was called, they found him to have BP 80/30 which improved to 100/30 following 500cc IVF bolus. In the OSH ER, patient again became hypotensive requiring initiation of levophed via a PIV. CXR showed a LLL infiltrate, for which he was started on Vanc and Zosyn then transferred to [facility] ICU.   On arrival, patient c/o 10/10 severe diffuse abdominal pain that has been constant x 1 day; also c/o nonbloody N/V. Denies diarrhea, CP, SOB, Cough, F/C. He is currently writhing in bed and dry heaving at time of my exam. Levophed @ 63mcg via a PIV.   ASSESSMENT / PLAN:  PULMONARY: 1. Acute Hypoxic Respiratory failure; LLL Pneumonia: - Continue full vent support, increase rate to 34 - F/U ABG and CXR now - Adjust vent for ABG - Use vent to compensate for metabolic acidosis for now - Abx as below  CARDIOVASCULAR 1. Shock; Afib RVR (new diagnosis); hx HTN and CHF (EF 20%); QT prolongation - Hold anti-HTN - Levophed and vasopressin - KVO IVF (received very large amount of fluid in the OR per CRNA) - New diagnosis Afib, likely in setting of sepsis, will monitor for now - Tele monitoring  RENAL 1. ESRD on HD: - Renal consult called - May need to place a dialysis catheter in AM if will need CRRT, will not tolerate HD right now - BMET in AM - Replace electrolytes as indicated  GASTROINTESTINAL 1. Abdominal pain; N/V; Transaminitis - Hold TF for now, if not extubatable in AM then will start TF - KUB shows no SBO  HEMATOLOGIC 1. 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 Septic shock: due to LLL Pneumonia +/- Intra-abdominal infxn; Metabolic acidosis due to severe Lactic acidosis - s/p only 500cc IVF given at OSH in setting of ESRD and CHF (EF 20%); given how hypoxic he is here, will hold of on further boluses and start slow NS gtt instead.  -  check CVP q4hrs and rebolus IVF's based on those results; may need to intubate him in order to appropriately treat his shock - lactate 10.9 and pH 7.20; gave 2 amps Bicarb IV; repeat blood gas in 2 hours.  - check cortisol and procalcitonin, trend lactate - 1 blood culture sent from OSH today; send 2nd blood culture now. Order UA (not clear if he makes urine) and Sputum culture - Continue Vanc and Zosyn - CT Abdomen ordered and pending  ENDOCRINE 1. DM: - NPO; SSI q4  NEUROLOGIC 1. Acute Encephalopathy: - Fentanyl drip - Monitor  FAMILY  - No family bedside 8/12  - Inter-disciplinary family meet or Palliative Care meeting due by: 01/16/18  The patient is critically ill with multiple organ systems failure and  requires high complexity decision making for assessment and support, frequent evaluation and titration of therapies, application of advanced monitoring technologies and extensive interpretation of multiple databases.   Critical Care Time devoted to patient care services described in this note is  36  Minutes. This time reflects time of care of this signee Dr Jennet Maduro. This critical care time does not reflect procedure time, or teaching time or supervisory time of PA/NP/Med student/Med Resident etc but could involve care discussion time.  Rush Farmer, [name] [facility] Pulmonary/Critical Care Medicine. Pager: [phone]. After hours pager: [phone].  01/10/2018, 10:48 AM

## 2018-01-10 NOTE — Progress Notes (Signed)
  Echocardiogram 2D Echocardiogram has been performed.  [name] 01/10/2018, 4:22 AM

## 2018-01-10 NOTE — Progress Notes (Signed)
Dr. Jimmey Ralph notified of troponin- 0.33  Will continue to monitor.

## 2018-01-10 NOTE — Progress Notes (Addendum)
Called this am as DOD about Mr. Cheuvront. I have reviewed the overnight consult note and discussed the case with the PCCM attending Dr. Jimmey Ralph. I have personally reviewed his echo, there is a large circumferential pericardial effusion which is consistent with tamponade physiology. LVEF 20% (consistent with prior findings). On exam he is intubated, sedated on vent. Heart sounds are distant - he is in a-fib with RVR. Distal pulses are weak and extremities are cool. BP noted to be 98/76. There is a fistula noted with +thrill.  Noted widely patent coronaries by cath in 08/2016. STAT INR 3.91. Will need additional FFP. Will d/w cath lab, but plan urgent pericardiocentesis.  CRITICAL CARE TIME: I have spent a total of 45 minutes with patient reviewing hospital notes, telemetry, EKGs, labs and examining the patient as well as establishing an assessment and plan that was discussed with the patient. > 50% of time was spent in direct patient care. The patient is critically ill with multi-organ system failure and requires high complexity decision making for assessment and support, frequent evaluation and titration of therapies, application of advanced monitoring technologies and extensive interpretation of multiple databases.   Pixie Casino, MD, [facility], [place] Director of the Advanced Lipid Disorders &  Cardiovascular Risk Reduction Clinic Diplomate of the American Board of Clinical Lipidology Attending Cardiologist  Direct Dial: [phone]  Fax: [phone]  Website:  [email or web address]

## 2018-01-10 NOTE — Progress Notes (Signed)
Patient quickly decompensated, with Pox dropping, BP dropping, and patient saying "I can't breathe. I feel like I'm going to pass out." Decision made to emergently intubate. Will update cardiology on decompensation and need to go to cath lab more emergently. 4th unit of FFP infusing now.

## 2018-01-10 NOTE — Procedures (Signed)
Central Venous Catheter Insertion Procedure Note  Procedure: Insertion of Central Venous Catheter  Indications:  vascular access, hypovolemia, need for frequent blood draws  Procedure Details  Informed consent was obtained for the procedure, including sedation.  Risks of lung perforation, hemorrhage, arrhythmia, and adverse drug reaction were discussed.   Maximum sterile technique was used including antiseptics, cap, gloves, gown, hand hygiene, mask and sheet.  Under sterile conditions the skin above the on the left internal jugular vein was prepped with betadine and covered with a sterile drape. Local anesthesia was applied to the skin and subcutaneous tissues. A 22-gauge needle was used to identify the vein. An 18-gauge needle was then inserted into the vein. A guide wire was then passed easily through the catheter. There were no arrhythmias. The catheter was then withdrawn. A triple-lumen catheter was then inserted into the vessel over the guide wire. The catheter was sutured into place.  Findings: There were no changes to vital signs. Catheter was flushed with 10 cc NS. Patient did tolerate procedure well.  Recommendations: CXR ordered to verify placement.

## 2018-01-10 NOTE — Progress Notes (Signed)
Inpatient Diabetes Program Recommendations  AACE/ADA: New Consensus Statement on Inpatient Glycemic Control (2019)  Target Ranges:  Prepandial:   less than 140 mg/dL      Peak postprandial:   less than 180 mg/dL (1-2 hours)      Critically ill patients:  140 - 180 mg/dL   Results for EFREM, PITSTICK (MRN [id number]) as of 01/10/2018 10:31  Ref. Range 01/09/2018 17:19 01/09/2018 21:06 01/10/2018 02:24 01/10/2018 04:05 01/10/2018 07:57  Glucose-Capillary Latest Ref Range: 70 - 99 mg/dL 200 (H) 190 (H) 223 (H) 197 (H) 203 (H)   Review of Glycemic Control  Diabetes history: DM2 Outpatient Diabetes medications: Lantus 15 units QHS, Novolog 10 units TID with meals Current orders for Inpatient glycemic control: Novolog 2-6 units Q4H  Inpatient Diabetes Program Recommendations: Insulin - Basal: Please consider ordering Lantus 10 units Q24H.  Thanks, Barnie Alderman, RN, MSN, CDE Diabetes Coordinator Inpatient Diabetes Program [phone] (Team Pager from 8am to 5pm)

## 2018-01-10 NOTE — Progress Notes (Signed)
Pt transported to [facility] after suctioning, without event.  RT will monitor.

## 2018-01-10 NOTE — Anesthesia Procedure Notes (Signed)
Arterial Line Insertion Start/End8/05/2018 9:58 AM, 01/10/2018 10:06 AM Performed by: Moshe Salisbury, CRNA, CRNA  Patient location: OR. Preanesthetic checklist: patient identified, IV checked, site marked, risks and benefits discussed, surgical consent, monitors and equipment checked, pre-op evaluation, timeout performed and anesthesia consent Left, radial was placed Catheter size: 20 G Hand hygiene performed , maximum sterile barriers used  and Seldinger technique used  Attempts: 1 Procedure performed without using ultrasound guided technique. Following insertion, dressing applied and Biopatch. Post procedure assessment: normal  Patient tolerated the procedure well with no immediate complications.

## 2018-01-10 NOTE — Progress Notes (Signed)
eLink Physician-Brief Progress Note Patient Name: [name] DOB: 1950-12-13 MRN: [id number]   Date of Service  01/10/2018  HPI/Events of Note  INR = 5.2.  eICU Interventions  Will transfuse 2 units FFP now.      Intervention Category Intermediate Interventions: Coagulopathy - evaluation and management  Ilias Stcharles Eugene 01/10/2018, 4:48 AM

## 2018-01-10 NOTE — Progress Notes (Signed)
First unit of FFP's started in CT. Will continue to monitor.

## 2018-01-10 NOTE — Anesthesia Preprocedure Evaluation (Addendum)
Anesthesia Evaluation  Patient identified by MRN, date of birth, ID band Patient unresponsive    Reviewed: Allergy & Precautions, Patient's Chart, lab work & pertinent test results, Unable to perform ROS - Chart review onlyPreop documentation limited or incomplete due to emergent nature of procedure.  Airway Mallampati: Intubated       Dental  (+) Poor Dentition   Pulmonary sleep apnea ,     + decreased breath sounds      Cardiovascular hypertension, + Peripheral Vascular Disease   Rhythm:Regular Rate:Tachycardia     Neuro/Psych  Headaches, negative psych ROS   GI/Hepatic Neg liver ROS, GERD  ,  Endo/Other  diabetes  Renal/GU Renal disease     Musculoskeletal   Abdominal   Peds  Hematology   Anesthesia Other Findings   Reproductive/Obstetrics                            Lab Results  Component Value Date   WBC 14.4 (H) 01/10/2018   HGB 10.4 (L) 01/10/2018   HCT 34.3 (L) 01/10/2018   MCV 96.6 01/10/2018   PLT 234 01/10/2018   Lab Results  Component Value Date   CREATININE 7.24 (H) 01/10/2018   BUN 33 (H) 01/10/2018   NA 136 01/10/2018   K 5.5 (H) 01/10/2018   CL 92 (L) 01/10/2018   CO2 17 (L) 01/10/2018   Lab Results  Component Value Date   INR 3.91 01/10/2018   INR 5.21 (HH) 01/10/2018   INR >10.00 (HH) 01/09/2018   Echo: - HPI and indications: STAT limited echo for tamponade. - Left ventricle: Small LV cavity. Moderate to severe LVH. The   estimated ejection fraction was 20%. Diffuse hypokinesis. - Right ventricle: RV appears underfilled- diastolic collapse is   noted. - Inferior vena cava: The vessel was dilated. The respirophasic   diameter changes were blunted (< 50%), consistent with elevated   central venous pressure. - Pericardium, extracardiac: Large pericardial effusion - measuring   up to 2 cm at the apex. Features are highly suggestive of   tamponade  physiology.  Anesthesia Physical Anesthesia Plan  ASA: IV and emergent  Anesthesia Plan: General   Post-op Pain Management:    Induction: Intravenous  PONV Risk Score and Plan: 3 and Ondansetron and Treatment may vary due to age or medical condition  Airway Management Planned: Oral ETT  Additional Equipment: CVP and Ultrasound Guidance Line Placement  Intra-op Plan:   Post-operative Plan: Post-operative intubation/ventilation  Informed Consent: I have reviewed the patients History and Physical, chart, labs and discussed the procedure including the risks, benefits and alternatives for the proposed anesthesia with the patient or authorized representative who has indicated his/her understanding and acceptance.     Plan Discussed with: CRNA  Anesthesia Plan Comments:       Anesthesia Quick Evaluation

## 2018-01-10 NOTE — Progress Notes (Signed)
[facility] Progress Note Patient Name: TROI FLORENDO DOB: 1950/06/22 MRN: [id number]   Date of Service  01/10/2018  HPI/Events of Note  ABG on 100% NRBM = 7.543/< 15/43.0/14.9  eICU Interventions  Will order: 1. BiPAP.      Intervention Category Major Interventions: Acid-Base disturbance - evaluation and management  Ardenia Stiner Eugene 01/10/2018, 4:43 AM

## 2018-01-10 NOTE — Progress Notes (Signed)
Urgent bedside transthoracic echocardiogram  Pericardial effusion: A moderate to large circumferential pericardial effusion is present RA and RV diastolic collapse is present LA: Not well visualized LV: Dilated, globally hypokinetic, EF 20%  Mitral and tricuspid flow variation could not be measured since portable echo does not allow for measurement of Doppler velocities.  Performed by: Melina Schools, MD, [facility]

## 2018-01-10 NOTE — Consult Note (Signed)
[name] is seen today for evaluation and treatment of ESRD. The patient is a 67 year old gentleman with a past medical history significant for ESRD on HD (last HD on 01/08/18) at [facility] on TTS, skin cancer of scalp s/p radiation treatment, NHLymphoma, SVT/atrial fibrillation,  OSA, PVD, NHL (2009), DM, HTN, GERD, Anemia, non-ischemic cardiomyopathy (EF 20%), LE DVT on Coumadin who was evaluated at [facility] for diffuse abdominal pain and hypotension which was persistent after dialysis Saturday. On presentation, abdominal CT scan revealed incidental finding of a large pericardial effusion, gallstones and thickened GB wall. His INR was 6.62 on coumadin. The patient was treated with pressors, IVF, FFP  and broad spectrum antibiotics. He was transferred to [facility] and had to be intubated for respiratory distress and underwent emergency pericardial window by Dr Ricard Dillon.  Past Medical History:  Diagnosis Date  . Anemia   . Arthritis    HNP- lumbar, "all over my body"  . Blood transfusion    "years ago; blood was low" (08/05/2013)  . CKD (chronic kidney disease) stage 4, GFR 15-29 ml/min (HCC) 03/18/2012   [place]- T,TH,Sat.  . Diabetic nephropathy ([place])   . Diabetic retinopathy   . DVT (deep venous thrombosis) ([place])    "got one in my right leg now; I've had one before too, not sure which leg" (08/05/2013)  . ESRD (end stage renal disease) on dialysis [facility])    "just started today, (08/04/2013)"  . Family history of anesthesia complication    " my son wakes up slowly"  . GERD (gastroesophageal reflux disease)    uses alka seltzere on occas.   [name])    "one q now and then" (08/05/2013)  . Hyperlipidemia   . Hypertension   . 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 Non Hodgkin's lymphoma ([place])    Tx 2009; "had chemo; it went away" (08/05/2013)  . Noncompliance 03/16/2012  . NSVT  (nonsustained ventricular tachycardia) ([place]) 03/18/2012  . Peripheral vascular disease ([place])   . Pneumonia 2013   hosp.-   . Poor historian    pt. unsure of several answers to health history questions   . Skin cancer    melanoma - head  . Sleep apnea    "suppose to have a sleep study, but they never told me when. (08/05/2013)   Past Surgical History:  Procedure Laterality Date  . ACHILLES TENDON SURGERY Right 03/13/2016   Procedure: ACHILLES LENGTHENING/KIDNER;  Surgeon: Edrick Kins, DPM;  Location: [place];  Service: Podiatry;  Laterality: Right;  . AV FISTULA PLACEMENT Left 02/03/2013   Procedure: ARTERIOVENOUS (AV) FISTULA CREATION- LEFT RADIAL CEPHALIC; ULTRASOUND GUIDED;  Surgeon: Mal Misty, MD;  Location: [facility] OR;  Service: Vascular;  Laterality: Left;  . AV FISTULA PLACEMENT Right 11/08/2015   Procedure: RIGHT BRACHIOCEPHALIC ARTERIOVENOUS (AV) FISTULA CREATION;  Surgeon: Serafina Mitchell, MD;  Location: [place];  Service: Vascular;  Laterality: Right;  . BASCILIC VEIN TRANSPOSITION Right 01/24/2016   Procedure: RIGHT SECOND STAGE BASILIC VEIN TRANSPOSITION;  Surgeon: Angelia Mould, MD;  Location: [place];  Service: Vascular;  Laterality: Right;  . CARDIAC CATHETERIZATION    . COLONOSCOPY N/A 09/23/2015   Procedure: COLONOSCOPY;  Surgeon: Irene Shipper, MD;  [facility];  Service: Endoscopy;  Laterality: N/A;  . Coloscopy    . EYE SURGERY Bilateral   . GAS  INSERTION  05/10/2012   Procedure: INSERTION OF GAS;  Surgeon: Hayden Pedro, MD;  Location: [place];  Service: Ophthalmology;  Laterality: Right;  . LEFT HEART CATH AND CORONARY ANGIOGRAPHY N/A 09/11/2016   Procedure: Left Heart Cath and Coronary Angiography;  Surgeon: Belva Crome, MD;  Location: [place] CV LAB;  Service: Cardiovascular;  Laterality: N/A;  . LESION EXCISION Right 05/08/2015   Procedure: EXCISION SCALP LESION;  Surgeon: Erroll Luna, MD;  Location: [place];  Service: General;  Laterality: Right;   . MEMBRANE PEEL  05/10/2012   Procedure: MEMBRANE PEEL;  Surgeon: Hayden Pedro, MD;  Location: [place];  Service: Ophthalmology;  Laterality: Right;  . PARS PLANA VITRECTOMY  08/27/2011   Procedure: PARS PLANA VITRECTOMY WITH 25 GAUGE;  Surgeon: Hayden Pedro, MD;  Location: [place];  Service: Ophthalmology;  Laterality: Left;  Repair of complex traction retinal detachment left eye  . PARS PLANA VITRECTOMY  05/10/2012   Procedure: PARS PLANA VITRECTOMY WITH 25 GAUGE;  Surgeon: Hayden Pedro, MD;  Location: [place];  Service: Ophthalmology;  Laterality: Right;  Repair Complex Traction Retinal Detachment  . PHOTOCOAGULATION WITH LASER  05/10/2012   Procedure: PHOTOCOAGULATION WITH LASER;  Surgeon: Hayden Pedro, MD;  Location: [place];  Service: Ophthalmology;  Laterality: Right;  . PORT-A-CATH REMOVAL    . PORTACATH PLACEMENT    . TRANSMETATARSAL AMPUTATION Right 03/13/2016   Procedure: TRANSMETATARSAL AMPUTATION;  Surgeon: Edrick Kins, DPM;  Location: [place];  Service: Podiatry;  Laterality: Right;  . VENA CAVA FILTER PLACEMENT  09/2012   due to preparation for surgery   Social History:  reports that he has never smoked. He has never used smokeless tobacco. He reports that he does not drink alcohol or use drugs. Allergies: No Known Allergies Family History  Problem Relation Age of Onset  . Anesthesia problems Son   . Hypertension Son   . Diabetes Father   . Hypertension Father   . Other Father        amputation  . Colon cancer Neg Hx     Medications:  Scheduled: . chlorhexidine gluconate (MEDLINE KIT)  15 mL Mouth Rinse BID  . insulin aspart  2-6 Units Subcutaneous Q4H  . mouth rinse  15 mL Mouth Rinse 10 times per day  . pantoprazole  40 mg Intravenous Q12H   Continuous: . fentaNYL infusion INTRAVENOUS 150 mcg/hr (01/10/18 1230)  . norepinephrine (LEVOPHED) Adult infusion 15 mcg/min (01/10/18 1242)  . piperacillin-tazobactam (ZOSYN)  IV 999 mL/hr at 01/10/18 0841  .  vasopressin (PITRESSIN) infusion - *FOR SHOCK* 0.03 Units/min (01/10/18 1230)   ROS: unobtainable Blood pressure 118/75, pulse (!) 112, temperature (!) 97.5 F (36.4 C), temperature source Oral, resp. rate (!) 34, height '6\' 3"'  (1.905 m), weight 111.3 kg, SpO2 96 %.  General appearance: sedated Head: Normocephalic, without obvious abnormality, atraumatic, oral intubation Eyes: closed Ears: normal TM's and external ear canals both ears Nose: Nares normal. Septum midline. Mucosa normal. No drainage or sinus tenderness. Throat: oral ET tube Resp: clear to auscultation bilaterally Chest wall: no tenderness, subxyphoid CT Cardio: regular rate and rhythm, S1, S2 normal, no murmur, click, rub or gallop GI: soft, non-tender; bowel sounds normal; no masses,  no organomegaly Extremities: 1+ edema RUE AVF not functioning Skin: Skin color, texture, turgor normal. No rashes or lesions Neurologic: Mental status: post op sedation Results for orders placed or performed during the hospital encounter of 01/09/18 (from the past 48 hour(s))  Culture, blood (routine x 2)     Status: None (Preliminary result)   Collection Time: 01/09/18  1:00 AM  Result Value Ref Range   Specimen Description BLOOD CENTRAL LINE    Special Requests      BOTTLES DRAWN AEROBIC ONLY Blood Culture adequate volume Performed at [facility], [place] [address] [place], [place] [address]    Culture PENDING    Report Status PENDING   I-Stat CG4 Lactic Acid, ED     Status: Abnormal   Collection Time: 01/09/18  3:28 PM  Result Value Ref Range   Lactic Acid, Venous 5.49 (HH) 0.5 - 1.9 mmol/L   Comment NOTIFIED PHYSICIAN   I-stat troponin, ED     Status: None   Collection Time: 01/09/18  3:30 PM  Result Value Ref Range   Troponin i, poc 0.08 0.00 - 0.08 ng/mL   Comment 3            Comment: Due to the release kinetics of cTnI, a negative result within the first hours of the onset of symptoms does not rule out myocardial  infarction with certainty. If myocardial infarction is still suspected, repeat the test at appropriate intervals.   Comprehensive metabolic panel     Status: Abnormal   Collection Time: 01/09/18  3:32 PM  Result Value Ref Range   Sodium 138 135 - 145 mmol/L   Potassium 4.8 3.5 - 5.1 mmol/L   Chloride 96 (L) 98 - 111 mmol/L   CO2 25 22 - 32 mmol/L   Glucose, Bld 238 (H) 70 - 99 mg/dL   BUN 25 (H) 8 - 23 mg/dL   Creatinine, Ser 6.16 (H) 0.61 - 1.24 mg/dL   Calcium 8.8 (L) 8.9 - 10.3 mg/dL   Total Protein 8.0 6.5 - 8.1 g/dL   Albumin 3.2 (L) 3.5 - 5.0 g/dL   AST 109 (H) 15 - 41 U/L   ALT 93 (H) 0 - 44 U/L   Alkaline Phosphatase 158 (H) 38 - 126 U/L   Total Bilirubin 1.6 (H) 0.3 - 1.2 mg/dL   GFR calc non Af Amer 9 (L) >60 mL/min   GFR calc Af Amer 10 (L) >60 mL/min    Comment: (NOTE) The eGFR has been calculated using the CKD EPI equation. This calculation has not been validated in all clinical situations. eGFR's persistently <60 mL/min signify possible Chronic Kidney Disease.    Anion gap 17 (H) 5 - 15    Comment: Performed at [facility], [address], [place], [place] [address]  Lipase, blood     Status: None   Collection Time: 01/09/18  3:32 PM  Result Value Ref Range   Lipase 25 11 - 51 U/L    Comment: Performed at [facility], [address], [place], [place] [address]  Brain natriuretic peptide     Status: Abnormal   Collection Time: 01/09/18  3:32 PM  Result Value Ref Range   B Natriuretic Peptide 276.0 (H) 0.0 - 100.0 pg/mL    Comment: Performed at [facility], [address], [place], [place] [address]  CBC with Differential     Status: Abnormal   Collection Time: 01/09/18  3:32 PM  Result Value Ref Range   WBC 7.1 4.0 - 10.5 K/uL   RBC 3.61 (L) 4.22 - 5.81 MIL/uL   Hemoglobin 10.8 (L) 13.0 - 17.0 g/dL   HCT 34.0 (L) 39.0 - 52.0 %   MCV 94.2 78.0 - 100.0 fL  MCH 29.9 26.0 - 34.0 pg   MCHC 31.8 30.0 - 36.0 g/dL   RDW 16.5 (H) 11.5 - 15.5 %   Platelets 207  150 - 400 K/uL   Neutrophils Relative % 75 %   Neutro Abs 5.3 1.7 - 7.7 K/uL   Lymphocytes Relative 17 %   Lymphs Abs 1.2 0.7 - 4.0 K/uL   Monocytes Relative 8 %   Monocytes Absolute 0.6 0.1 - 1.0 K/uL   Eosinophils Relative 0 %   Eosinophils Absolute 0.0 0.0 - 0.7 K/uL   Basophils Relative 0 %   Basophils Absolute 0.0 0.0 - 0.1 K/uL    Comment: Performed at [facility], [address], [place], [place] [address]  Protime-INR     Status: Abnormal   Collection Time: 01/09/18  3:32 PM  Result Value Ref Range   Prothrombin Time 57.3 (H) 11.4 - 15.2 seconds   INR 6.62 (HH)     Comment: REPEATED TO VERIFY CRITICAL RESULT CALLED TO, READ BACK BY AND VERIFIED WITH: MANDY @ [id number] ON [id number] BY HENDERSON L. Performed at [facility], [address], [place], [place] [address]   Procalcitonin - Baseline     Status: None   Collection Time: 01/09/18  3:32 PM  Result Value Ref Range   Procalcitonin 1.44 ng/mL    Comment:        Interpretation: PCT > 0.5 ng/mL and <= 2 ng/mL: Systemic infection (sepsis) is possible, but other conditions are known to elevate PCT as well. (NOTE)       Sepsis PCT Algorithm           Lower Respiratory Tract                                      Infection PCT Algorithm    ----------------------------     ----------------------------         PCT < 0.25 ng/mL                PCT < 0.10 ng/mL         Strongly encourage             Strongly discourage   discontinuation of antibiotics    initiation of antibiotics    ----------------------------     -----------------------------       PCT 0.25 - 0.50 ng/mL            PCT 0.10 - 0.25 ng/mL               OR       >80% decrease in PCT            Discourage initiation of                                            antibiotics      Encourage discontinuation           of antibiotics    ----------------------------     -----------------------------         PCT >= 0.50 ng/mL              PCT 0.26 - 0.50 ng/mL                 AND       <80% decrease  in PCT             Encourage initiation of                                             antibiotics       Encourage continuation           of antibiotics    ----------------------------     -----------------------------        PCT >= 0.50 ng/mL                  PCT > 0.50 ng/mL               AND         increase in PCT                  Strongly encourage                                      initiation of antibiotics    Strongly encourage escalation           of antibiotics                                     -----------------------------                                           PCT <= 0.25 ng/mL                                                 OR                                        > 80% decrease in PCT                                     Discontinue / Do not initiate                                             antibiotics Performed at [facility], [address] [place], [place] [address]   Blood Culture (routine x 2)     Status: None (Preliminary result)   Collection Time: 01/09/18  4:16 PM  Result Value Ref Range   Specimen Description      LEFT ANTECUBITAL BOTTLES DRAWN AEROBIC AND ANAEROBIC   Special Requests Blood Culture adequate volume    Culture      NO GROWTH < 24 HOURS Performed at [facility], [address], [place], [place] [address]    Report Status PENDING   CBG monitoring, ED     Status: Abnormal   Collection Time: 01/09/18  5:19  PM  Result Value Ref Range   Glucose-Capillary 200 (H) 70 - 99 mg/dL  I-Stat CG4 Lactic Acid, ED     Status: Abnormal   Collection Time: 01/09/18  8:09 PM  Result Value Ref Range   Lactic Acid, Venous 10.29 (HH) 0.5 - 1.9 mmol/L   Comment NOTIFIED PHYSICIAN   Glucose, capillary     Status: Abnormal   Collection Time: 01/09/18  9:06 PM  Result Value Ref Range   Glucose-Capillary 190 (H) 70 - 99 mg/dL   Comment 1 Notify RN   MRSA PCR Screening     Status: None   Collection Time: 01/09/18  9:21 PM   Result Value Ref Range   MRSA by PCR NEGATIVE NEGATIVE    Comment:        The GeneXpert MRSA Assay (FDA approved for NASAL specimens only), is one component of a comprehensive MRSA colonization surveillance program. It is not intended to diagnose MRSA infection nor to guide or monitor treatment for MRSA infections. Performed at [facility], [place] [address], [place], [place] [address]   Lactic acid, plasma     Status: Abnormal   Collection Time: 01/09/18 10:17 PM  Result Value Ref Range   Lactic Acid, Venous 10.9 (HH) 0.5 - 1.9 mmol/L    Comment: CRITICAL RESULT CALLED TO, READ BACK BY AND VERIFIED WITH: ELIAS,M RN 01/10/2018 0034 JORDANS Performed at [facility], [place] [address] [place], [place] [address]   Troponin I (q 6hr x 3)     Status: Abnormal   Collection Time: 01/09/18 10:17 PM  Result Value Ref Range   Troponin I 0.11 (HH) <0.03 ng/mL    Comment: CRITICAL RESULT CALLED TO, READ BACK BY AND VERIFIED WITH: ELIAS,M RN 01/10/2018 0045 JORDANS Performed at [facility], [place] [address], [place], [place] [address]   Protime-INR     Status: Abnormal   Collection Time: 01/09/18 10:17 PM  Result Value Ref Range   Prothrombin Time 83.2 (H) 11.4 - 15.2 seconds    Comment: REPEATED TO VERIFY   INR >10.00 (HH)     Comment: REPEATED TO VERIFY CRITICAL RESULT CALLED TO, READ BACK BY AND VERIFIED WITH: C.HAMMONDS,MD 0122 01/10/18 [email or web address] Performed at [facility], [place] [address], [place], [place] [address]   Comprehensive metabolic panel     Status: Abnormal   Collection Time: 01/09/18 10:47 PM  Result Value Ref Range   Sodium 129 (L) 135 - 145 mmol/L   Potassium 5.5 (H) 3.5 - 5.1 mmol/L   Chloride 86 (L) 98 - 111 mmol/L   CO2 17 (L) 22 - 32 mmol/L   Glucose, Bld 487 (H) 70 - 99 mg/dL   BUN 28 (H) 8 - 23 mg/dL   Creatinine, Ser 7.04 (H) 0.61 - 1.24 mg/dL   Calcium 8.7 (L) 8.9 - 10.3 mg/dL   Total Protein 7.3 6.5 - 8.1 g/dL   Albumin 2.7 (L) 3.5  - 5.0 g/dL   AST 434 (H) 15 - 41 U/L   ALT 418 (H) 0 - 44 U/L   Alkaline Phosphatase 157 (H) 38 - 126 U/L   Total Bilirubin 2.0 (H) 0.3 - 1.2 mg/dL   GFR calc non Af Amer 7 (L) >60 mL/min   GFR calc Af Amer 8 (L) >60 mL/min    Comment: (NOTE) The eGFR has been calculated using the CKD EPI equation. This calculation has not been validated in all clinical situations. eGFR's persistently <60  mL/min signify possible Chronic Kidney Disease.    Anion gap 26 (H) 5 - 15    Comment: RESULT CHECKED Performed at [facility], [place] [address] [place], [place] [address]   CBC with Differential/Platelet     Status: Abnormal   Collection Time: 01/09/18 10:47 PM  Result Value Ref Range   WBC 12.1 (H) 4.0 - 10.5 K/uL   RBC 3.43 (L) 4.22 - 5.81 MIL/uL   Hemoglobin 10.1 (L) 13.0 - 17.0 g/dL   HCT 34.0 (L) 39.0 - 52.0 %   MCV 99.1 78.0 - 100.0 fL   MCH 29.4 26.0 - 34.0 pg   MCHC 29.7 (L) 30.0 - 36.0 g/dL   RDW 16.4 (H) 11.5 - 15.5 %   Platelets 219 150 - 400 K/uL   Neutrophils Relative % 85 %   Neutro Abs 10.3 (H) 1.7 - 7.7 K/uL   Lymphocytes Relative 8 %   Lymphs Abs 0.9 0.7 - 4.0 K/uL   Monocytes Relative 6 %   Monocytes Absolute 0.7 0.1 - 1.0 K/uL   Eosinophils Relative 0 %   Eosinophils Absolute 0.0 0.0 - 0.7 K/uL   Basophils Relative 0 %   Basophils Absolute 0.0 0.0 - 0.1 K/uL   Immature Granulocytes 1 %   Abs Immature Granulocytes 0.1 0.0 - 0.1 K/uL    Comment: Performed at [facility], [place] [address], [place], [place] [address]  Cortisol     Status: None   Collection Time: 01/09/18 11:21 PM  Result Value Ref Range   Cortisol, Plasma 39.5 ug/dL    Comment: (NOTE) AM    6.7 - 22.6 ug/dL PM   <10.0       ug/dL Performed at [place] [address], [place], [place] [address]   I-STAT 3, arterial blood gas (G3+)     Status: Abnormal   Collection Time: 01/09/18 11:30 PM  Result Value Ref Range   pH, Arterial 7.208 (L) 7.350 - 7.450   pCO2 arterial 42.4  32.0 - 48.0 mmHg   pO2, Arterial 19.0 (LL) 83.0 - 108.0 mmHg   Bicarbonate 17.0 (L) 20.0 - 28.0 mmol/L   TCO2 18 (L) 22 - 32 mmol/L   O2 Saturation 22.0 %   Acid-base deficit 11.0 (H) 0.0 - 2.0 mmol/L   Patient temperature 97.5 F    Collection site BRACHIAL ARTERY    Drawn by RT    Sample type ARTERIAL    Comment NOTIFIED PHYSICIAN   Culture, blood (routine x 2)     Status: None (Preliminary result)   Collection Time: 01/10/18  1:00 AM  Result Value Ref Range   Specimen Description BLOOD CENTRAL LINE    Special Requests      BOTTLES DRAWN AEROBIC ONLY Blood Culture adequate volume Performed at [facility], [place] [address], [place], [place] [address]    Culture PENDING    Report Status PENDING   Prepare fresh frozen plasma     Status: None (Preliminary result)   Collection Time: 01/10/18  1:32 AM  Result Value Ref Range   Unit Number [id number]    Blood Component Type THW PLS APHR    Unit division B0    Status of Unit ISSUED    Transfusion Status OK TO TRANSFUSE    Unit Number [id number]    Blood Component Type THAWED PLASMA    Unit division 00    Status of Unit ISSUED    Transfusion Status  OK TO TRANSFUSE   Glucose, capillary     Status: Abnormal   Collection Time: 01/10/18  2:24 AM  Result Value Ref Range   Glucose-Capillary 223 (H) 70 - 99 mg/dL  I-STAT 3, arterial blood gas (G3+)     Status: Abnormal   Collection Time: 01/10/18  3:16 AM  Result Value Ref Range   pH, Arterial 7.543 (H) 7.350 - 7.450   pCO2 arterial <15.0 (LL) 32.0 - 48.0 mmHg   pO2, Arterial 43.0 (L) 83.0 - 108.0 mmHg   Bicarbonate 14.9 (L) 20.0 - 28.0 mmol/L   TCO2 16 (L) 22 - 32 mmol/L   O2 Saturation 97.0 %   Acid-base deficit 10.0 (H) 0.0 - 2.0 mmol/L   Patient temperature 23.0 C    Collection site CRRT    Sample type ARTERIAL    Comment NOTIFIED PHYSICIAN   Basic metabolic panel     Status: Abnormal   Collection Time: 01/10/18  3:54 AM  Result Value Ref Range   Sodium 136 135  - 145 mmol/L    Comment: DELTA CHECK NOTED   Potassium 5.5 (H) 3.5 - 5.1 mmol/L   Chloride 92 (L) 98 - 111 mmol/L   CO2 17 (L) 22 - 32 mmol/L   Glucose, Bld 255 (H) 70 - 99 mg/dL   BUN 33 (H) 8 - 23 mg/dL   Creatinine, Ser 7.24 (H) 0.61 - 1.24 mg/dL   Calcium 8.4 (L) 8.9 - 10.3 mg/dL   GFR calc non Af Amer 7 (L) >60 mL/min   GFR calc Af Amer 8 (L) >60 mL/min    Comment: (NOTE) The eGFR has been calculated using the CKD EPI equation. This calculation has not been validated in all clinical situations. eGFR's persistently <60 mL/min signify possible Chronic Kidney Disease.    Anion gap 27 (H) 5 - 15    Comment: Performed at [facility], [place] [address] [place], [place] [address]  CBC     Status: Abnormal   Collection Time: 01/10/18  3:54 AM  Result Value Ref Range   WBC 14.4 (H) 4.0 - 10.5 K/uL   RBC 3.55 (L) 4.22 - 5.81 MIL/uL   Hemoglobin 10.4 (L) 13.0 - 17.0 g/dL   HCT 34.3 (L) 39.0 - 52.0 %   MCV 96.6 78.0 - 100.0 fL   MCH 29.3 26.0 - 34.0 pg   MCHC 30.3 30.0 - 36.0 g/dL   RDW 16.3 (H) 11.5 - 15.5 %   Platelets 234 150 - 400 K/uL    Comment: Performed at [facility], [place] [address] [place], [place] [address]  Magnesium     Status: None   Collection Time: 01/10/18  3:54 AM  Result Value Ref Range   Magnesium 2.2 1.7 - 2.4 mg/dL    Comment: Performed at [facility] [address] [place], [place] [address]  Phosphorus     Status: Abnormal   Collection Time: 01/10/18  3:54 AM  Result Value Ref Range   Phosphorus 8.0 (H) 2.5 - 4.6 mg/dL    Comment: Performed at [place] [address], [place], [place] [address]  Troponin I (q 6hr x 3)     Status: Abnormal   Collection Time: 01/10/18  3:54 AM  Result Value Ref Range   Troponin I 0.33 (HH) <0.03 ng/mL    Comment: CRITICAL RESULT CALLED TO, READ BACK BY AND VERIFIED WITH: Everardo Beals [id number] [facility] Performed at [facility], [place] [address] [place], [place] [address]   Lactic  acid, plasma     Status: Abnormal   Collection Time: 01/10/18  3:54 AM  Result Value Ref Range   Lactic Acid, Venous 10.9 (HH) 0.5 - 1.9 mmol/L    Comment: RESULTS CONFIRMED BY MANUAL DILUTION CRITICAL RESULT CALLED TO, READ BACK BY AND VERIFIED WITH: Geannie Risen [id number] [facility] Performed at [facility], [address], [place], [place] [address]   Procalcitonin     Status: None   Collection Time: 01/10/18  3:54 AM  Result Value Ref Range   Procalcitonin 3.04 ng/mL    Comment:        Interpretation: PCT > 2 ng/mL: Systemic infection (sepsis) is likely, unless other causes are known. (NOTE)       Sepsis PCT Algorithm           Lower Respiratory Tract                                      Infection PCT Algorithm    ----------------------------     ----------------------------         PCT < 0.25 ng/mL                PCT < 0.10 ng/mL         Strongly encourage             Strongly discourage   discontinuation of antibiotics    initiation of antibiotics    ----------------------------     -----------------------------       PCT 0.25 - 0.50 ng/mL            PCT 0.10 - 0.25 ng/mL               OR       >80% decrease in PCT            Discourage initiation of                                            antibiotics      Encourage discontinuation           of antibiotics    ----------------------------     -----------------------------         PCT >= 0.50 ng/mL              PCT 0.26 - 0.50 ng/mL               AND       <80% decrease in PCT              Encourage initiation of                                             antibiotics       Encourage continuation           of antibiotics    ----------------------------     -----------------------------        PCT >= 0.50 ng/mL                  PCT > 0.50 ng/mL  AND         increase in PCT                  Strongly encourage                                      initiation of antibiotics    Strongly encourage  escalation           of antibiotics                                     -----------------------------                                           PCT <= 0.25 ng/mL                                                 OR                                        > 80% decrease in PCT                                     Discontinue / Do not initiate                                             antibiotics Performed at [facility], [address], [place], [place] [address]   Lipase, blood     Status: None   Collection Time: 01/10/18  3:57 AM  Result Value Ref Range   Lipase 37 11 - 51 U/L    Comment: Performed at [facility], [place] [address] [place], [place] [address]  Protime-INR     Status: Abnormal   Collection Time: 01/10/18  3:58 AM  Result Value Ref Range   Prothrombin Time 47.5 (H) 11.4 - 15.2 seconds    Comment: REPEATED TO VERIFY   INR 5.21 (HH)     Comment: REPEATED TO VERIFY CRITICAL RESULT CALLED TO, READ BACK BY AND VERIFIED WITH: M.ELLIOTT,RN [id number] 01/10/18 G.MCADOO Performed at [facility], [place] [address] [place], [place] [address]   Glucose, capillary     Status: Abnormal   Collection Time: 01/10/18  4:05 AM  Result Value Ref Range   Glucose-Capillary 197 (H) 70 - 99 mg/dL   Comment 1 Notify RN   .Cooxemetry Panel (carboxy, met, total hgb, O2 sat)     Status: Abnormal   Collection Time: 01/10/18  4:30 AM  Result Value Ref Range   Total hemoglobin 8.7 (L) 12.0 - 16.0 g/dL   O2 Saturation 34.1 %   Carboxyhemoglobin 0.6 0.5 - 1.5 %   Methemoglobin 1.9 (H) 0.0 - 1.5 %  Prepare fresh frozen plasma  Status: None (Preliminary result)   Collection Time: 01/10/18  5:06 AM  Result Value Ref Range   Unit Number [id number]    Blood Component Type THAWED PLASMA    Unit division 00    Status of Unit ISSUED    Transfusion Status OK TO TRANSFUSE    Unit Number [id number]    Blood Component Type THAWED PLASMA    Unit division 00    Status of Unit  ISSUED    Transfusion Status OK TO TRANSFUSE    Unit Number [id number]    Blood Component Type THAWED PLASMA    Unit division 00    Status of Unit ISSUED    Transfusion Status      OK TO TRANSFUSE Performed at [facility], [place] [address], [place], [place] [address]   Type and screen [place]     Status: None   Collection Time: 01/10/18  5:10 AM  Result Value Ref Range   ABO/RH(D) A POS    Antibody Screen NEG    Sample Expiration      01/13/2018 Performed at [facility], [place] [address] [place], [place] [address]   Protime-INR     Status: Abnormal   Collection Time: 01/10/18  6:33 AM  Result Value Ref Range   Prothrombin Time 38.0 (H) 11.4 - 15.2 seconds   INR 3.91     Comment: Performed at [facility] [address] [place], [place] [address]  Lactic acid, plasma     Status: Abnormal   Collection Time: 01/10/18  7:13 AM  Result Value Ref Range   Lactic Acid, Venous 12.7 (HH) 0.5 - 1.9 mmol/L    Comment: RESULTS CONFIRMED BY MANUAL DILUTION CRITICAL RESULT CALLED TO, READ BACK BY AND VERIFIED WITHAleen Sells RN [phone] 01/10/2018 BY A BENNETT Performed at [facility], [place] [address] [place], [place] [address]   CBC     Status: Abnormal   Collection Time: 01/10/18  7:13 AM  Result Value Ref Range   WBC 15.1 (H) 4.0 - 10.5 K/uL   RBC 4.09 (L) 4.22 - 5.81 MIL/uL   Hemoglobin 12.3 (L) 13.0 - 17.0 g/dL   HCT 40.5 39.0 - 52.0 %   MCV 99.0 78.0 - 100.0 fL   MCH 30.1 26.0 - 34.0 pg   MCHC 30.4 30.0 - 36.0 g/dL   RDW 16.5 (H) 11.5 - 15.5 %   Platelets 259 150 - 400 K/uL    Comment: Performed at [facility] [address] [place], [place] [address]  Basic metabolic panel     Status: Abnormal   Collection Time: 01/10/18  7:13 AM  Result Value Ref Range   Sodium 138 135 - 145 mmol/L   Potassium 4.9 3.5 - 5.1 mmol/L   Chloride 90 (L) 98 - 111 mmol/L   CO2 17 (L) 22 - 32 mmol/L   Glucose, Bld 221 (H) 70 - 99 mg/dL    BUN 33 (H) 8 - 23 mg/dL   Creatinine, Ser 7.42 (H) 0.61 - 1.24 mg/dL   Calcium 8.6 (L) 8.9 - 10.3 mg/dL   GFR calc non Af Amer 7 (L) >60 mL/min   GFR calc Af Amer 8 (L) >60 mL/min    Comment: (NOTE) The eGFR has been calculated using the CKD EPI equation. This calculation has not been validated in all clinical situations. eGFR's persistently <60 mL/min signify possible Chronic Kidney Disease.  Anion gap 31 (H) 5 - 15    Comment: Performed at [facility], [place] [address], [place], [place] [address]  Prepare fresh frozen plasma     Status: None (Preliminary result)   Collection Time: 01/10/18  7:38 AM  Result Value Ref Range   Unit Number [id number]    Blood Component Type THAWED PLASMA    Unit division 00    Status of Unit ISSUED    Transfusion Status      OK TO TRANSFUSE Performed at [facility] [address] [place], [place] [address]   Glucose, capillary     Status: Abnormal   Collection Time: 01/10/18  7:57 AM  Result Value Ref Range   Glucose-Capillary 203 (H) 70 - 99 mg/dL   Comment 1 Capillary Specimen   Body fluid culture     Status: None (Preliminary result)   Collection Time: 01/10/18  9:28 AM  Result Value Ref Range   Specimen Description FLUID PERICARDIAL    Special Requests C    Gram Stain      MODERATE WBC PRESENT, PREDOMINANTLY PMN NO ORGANISMS SEEN Performed at [facility], [place] [address] [place], [place] [address]    Culture PENDING    Report Status PENDING   I-STAT 7, (LYTES, BLD GAS, ICA, H+H)     Status: Abnormal   Collection Time: 01/10/18 10:11 AM  Result Value Ref Range   pH, Arterial 7.134 (LL) 7.350 - 7.450   pCO2 arterial 46.6 32.0 - 48.0 mmHg   pO2, Arterial 310.0 (H) 83.0 - 108.0 mmHg   Bicarbonate 15.8 (L) 20.0 - 28.0 mmol/L   TCO2 17 (L) 22 - 32 mmol/L   O2 Saturation 100.0 %   Acid-base deficit 13.0 (H) 0.0 - 2.0 mmol/L   Sodium 133 (L) 135 - 145 mmol/L   Potassium 4.2 3.5 - 5.1 mmol/L   Calcium, Ion 0.95  (L) 1.15 - 1.40 mmol/L   HCT 28.0 (L) 39.0 - 52.0 %   Hemoglobin 9.5 (L) 13.0 - 17.0 g/dL   Patient temperature 36.3 C    Sample type ARTERIAL    Comment NOTIFIED PHYSICIAN   Body fluid culture     Status: None (Preliminary result)   Collection Time: 01/10/18 10:15 AM  Result Value Ref Range   Specimen Description FLUID PERICARDIAL    Special Requests B    Gram Stain      MODERATE WBC PRESENT, PREDOMINANTLY PMN NO ORGANISMS SEEN Performed at [facility], [place] [address], [place], [place] [address]    Culture PENDING    Report Status PENDING   Glucose, capillary     Status: Abnormal   Collection Time: 01/10/18 12:14 PM  Result Value Ref Range   Glucose-Capillary 185 (H) 70 - 99 mg/dL   Comment 1 Capillary Specimen    Dg Chest 2 View  Result Date: 01/09/2018 CLINICAL DATA:  Dialysis treatment today. Patient not feeling well since that time. EXAM: CHEST - 2 VIEW COMPARISON:  March 05, 2016 FINDINGS: Stable cardiomegaly. Increased opacity in left retrocardiac region may represent atelectasis or infiltrate. This is best seen on the lateral view. The hila and mediastinum are normal. No pneumothorax. No pulmonary nodules or masses. IMPRESSION: Left retrocardiac opacity could represent atelectasis or infiltrate. Recommend clinical correlation and follow-up to resolution. Electronically Signed   By: Dorise Bullion III M.D   On: 01/09/2018 16:08   Dg Abd 1 View  Result Date: 01/10/2018 CLINICAL  DATA:  Orogastric tube placement EXAM: ABDOMEN - 1 VIEW COMPARISON:  CT from earlier the same day FINDINGS: Enteric tube has been advanced into the stomach which is nondilated. Visualized bowel gas pattern normal. IVC filter at the L2 level. IMPRESSION: Enteric tube to the gastric fundus.  Normal bowel gas pattern. Electronically Signed   By: Lucrezia Europe [name]   On: 01/10/2018 08:00   Dg Abd 1 View  Result Date: 01/09/2018 CLINICAL DATA:  Abdominal pain EXAM: ABDOMEN - 1 VIEW COMPARISON:  None.  FINDINGS: Scattered large and small bowel gas is noted. IVC filter is noted in place. Diffuse vascular calcifications are seen. No acute bony abnormality is noted. IMPRESSION: No acute abnormality seen. Electronically Signed   By: Inez Catalina [name]   On: 01/09/2018 23:13   Ct Abdomen Pelvis W Contrast  Result Date: 01/10/2018 CLINICAL DATA:  Abdominal pain, sepsis, hypotension. Severe acute abdominal pain and shock. History of end-stage renal disease on hemodialysis. EXAM: CT ABDOMEN AND PELVIS WITH CONTRAST TECHNIQUE: Multidetector CT imaging of the abdomen and pelvis was performed using the standard protocol following bolus administration of intravenous contrast. CONTRAST:  143m OMNIPAQUE IOHEXOL 300 MG/ML  SOLN COMPARISON:  None. FINDINGS: Lower chest: Small left pleural effusion. Consolidation in the left lung base may be due to atelectasis or pneumonia. Less prominent atelectasis or infiltration in the right lung base. Large pericardial effusion. Heart size is normal. Hepatobiliary: Diffuse fatty infiltration of the liver. There is a large stone in the gallbladder. Gallbladder wall is thickened and there is edema around the gallbladder suggesting cholecystitis. No bile duct dilatation. Pancreas: Hazy outline of the pancreas particularly around the head with infiltration in the peripancreatic fat. Changes are consistent with acute pancreatitis. No loculated collections identified. Spleen: Normal in size without focal abnormality. Adrenals/Urinary Tract: Adrenal glands are unremarkable. Kidneys are normal, without renal calculi, focal lesion, or hydronephrosis. Bladder is decompressed. Stomach/Bowel: Stomach, small bowel, and colon are not abnormally distended. No wall thickening is identified although under distention limits evaluation of bowel wall. No inflammatory changes suggested. Appendix is normal. Vascular/Lymphatic: Aortic atherosclerosis. Prominent vascular calcifications throughout the abdomen and  pelvis. No enlarged abdominal or pelvic lymph nodes. Inferior vena caval filter present. Reproductive: Prostate is unremarkable. Other: No free air or free fluid in the abdomen. Small periumbilical hernias containing fat. Lipoma in the left inguinal canal. Musculoskeletal: Degenerative changes in the spine. Bridging anterior osteophytes. Disc space narrowing with endplate sclerosis and erosion demonstrated at the lumbosacral inter space. This was present on a previous study from 03/10/2016 although there is progression. The previous study demonstrated degenerative changes with Schmorl's nodes period changes today likely also represent degenerative change although discitis is not entirely excluded. Consider MRI for further evaluation if clinically indicated. IMPRESSION: 1. Cholelithiasis with gallbladder wall thickening and pericholecystic edema suggesting cholecystitis. 2. Infiltration in the peripancreatic fat consistent with acute pancreatitis. No loculated collections. 3. Diffuse fatty infiltration of the liver. 4. Small left pleural effusion. Consolidation in the left lung base may be due to atelectasis or pneumonia. Less prominent atelectasis or infiltration in the right lung base. 5. Large pericardial effusion. 6. Small periumbilical hernias containing fat. Lipoma in the left inguinal canal. 7. Disc space narrowing and erosion at the lumbosacral junction. This was present on the previous study from 2017 and likely represents degenerative change although progression is shown. Consider MRI for further evaluation if clinically indicated. 8. Aortic atherosclerosis with extensive vascular calcifications. Electronically Signed   By: WGwyndolyn Saxon  Gerilyn Nestle [name]   On: 01/10/2018 02:28   Dg Chest Port 1 View  Result Date: 01/10/2018 CLINICAL DATA:  Acute hypoxic respiratory failure. Shock. Pericardial effusion. EXAM: PORTABLE CHEST 1 VIEW COMPARISON:  Chest x-rays dated 01/10/2018 and CT scan of the abdomen dated  01/10/2018 FINDINGS: Endotracheal tube tip is 5.4 cm above the carina. Left central line tip is at the cavoatrial junction, unchanged. NG tube tip is in the fundus of the stomach. There is a tube at the left lung base which may represent a tube in the pericardial space. Cardiac silhouette is prominent but unchanged. Pulmonary vascularity is normal. Persistent consolidation in the left lower lobe. Right lung is clear. IMPRESSION: 1. Persistent left lower lobe pneumonia. 2. Persistent prominence of the cardiac silhouette. There appears to be a pericardial drainage tube in place. 3. Other support apparatus appears in good position. Electronically Signed   By: Lorriane Shire [name]   On: 01/10/2018 11:29   Portable Chest X-ray  Result Date: 01/10/2018 CLINICAL DATA:  67 y/o  M; ET tube placement. EXAM: PORTABLE CHEST 1 VIEW COMPARISON:  01/10/2018 chest radiograph. FINDINGS: Left central venous catheter tip is stable projecting over lower SVC. Endotracheal tube tip projects 5.3 cm above the carina. Enteric tube tip extends below the field of view. Stable cardiomegaly and calcific aortic atherosclerosis. Stable left lower lobe airspace opacity and probable superimposed pulmonary edema. IMPRESSION: 1. Endotracheal tube tip 5.3 cm above carina. 2. Enteric tube tip below field of view in the abdomen. 3. Stable left central venous catheter. 4. Stable left basilar opacity, probable pulmonary edema, cardiomegaly, aortic atherosclerosis. Electronically Signed   By: Kristine Garbe [name]   On: 01/10/2018 06:46   Dg Chest Port 1 View  Result Date: 01/10/2018 CLINICAL DATA:  Status post central line placement EXAM: PORTABLE CHEST 1 VIEW COMPARISON:  01/09/2018 FINDINGS: Left jugular central line is now seen at the cavoatrial junction. No pneumothorax is noted. Cardiac shadow is enlarged but stable. Left basilar atelectatic changes are again noted and stable. IMPRESSION: No pneumothorax following central line  placement. The remainder of the exam is stable. Electronically Signed   By: Inez Catalina [name]   On: 01/10/2018 00:26   Dg Chest Port 1 View  Result Date: 01/09/2018 CLINICAL DATA:  Evaluate pneumonia. EXAM: PORTABLE CHEST 1 VIEW COMPARISON:  01/09/2018. FINDINGS: Stable cardiac enlargement and aortic atherosclerosis. Significantly diminished lung volumes. Mild pulmonary edema suspected. Left lower lobe airspace opacity is unchanged from previous exam. IMPRESSION: 1. No change in aeration to the left lung base compared with previous exam. 2. Suspect mild CHF. Aortic Atherosclerosis (ICD10-I70.0). Electronically Signed   By: Kerby Moors [name]   On: 01/09/2018 23:14   US Abdomen Limited Ruq  Result Date: 01/10/2018 CLINICAL DATA:  Abdominal pain. Abnormal CT scan. CT 01/11/2008 demonstrated cholelithiasis with suggestion of cholecystitis. Pancreatitis. EXAM: ULTRASOUND ABDOMEN LIMITED RIGHT UPPER QUADRANT COMPARISON:  CT abdomen and pelvis 01/10/2018 FINDINGS: Gallbladder: Large stone in the gallbladder measuring 2.2 cm diameter. Diffusely thickened gallbladder wall measuring up to 8 mm. Murphy's sign is negative. Common bile duct: Diameter: 4.9 mm, normal. Liver: No focal lesion identified. Within normal limits in parenchymal echogenicity. Portal vein is patent on color Doppler imaging with normal direction of blood flow towards the liver. IMPRESSION: Cholelithiasis with diffuse gallbladder wall thickening. Negative Murphy's sign. Changes are nonspecific but may indicate acute cholecystitis in the appropriate clinical setting. Electronically Signed   By: Lucienne Capers [name]   On: 01/10/2018 03:35  Renal Assessment:  1 ESRD 2 s/p pericardial effusion drainage 3 nonfunctioning RUE AVF, possibly clotted(v. Low BP) 4 VDRF 5 Shock 6 coagulopathy 7 pos fluid balance 8 Acidosis--CCM managing  Plan: 1 Evaluate for dialysis needs on Tuesday. 2 If AVF clotted(which I suspect), will need a temporary  catheter; and eventual declot when more stable 3 Will need to do CRRT if BP remains low 4 prn Bicarb  Estanislado Emms, MD 01/10/2018, 12:48 PM

## 2018-01-10 NOTE — Progress Notes (Addendum)
Patient intubated prior to transfer to [facility]. PRN dose of fentanyl pulled and 139mcgs given per Dr Jimmey Ralph. Patient tolerated transfer well. Report given to [facility], Therapist, sports. Family notified of transfer.

## 2018-01-10 NOTE — Telephone Encounter (Signed)
Fax sent to scheduling, notes on file.

## 2018-01-10 NOTE — Progress Notes (Signed)
Patient being transported to Ct. Nurse at bedside.

## 2018-01-11 ENCOUNTER — Encounter (HOSPITAL_COMMUNITY): Payer: Self-pay | Admitting: Thoracic Surgery (Cardiothoracic Vascular Surgery)

## 2018-01-11 ENCOUNTER — Inpatient Hospital Stay (HOSPITAL_COMMUNITY): Payer: Medicare Other

## 2018-01-11 DIAGNOSIS — E872 Acidosis, unspecified: Secondary | ICD-10-CM

## 2018-01-11 DIAGNOSIS — K8500 Idiopathic acute pancreatitis without necrosis or infection: Secondary | ICD-10-CM

## 2018-01-11 DIAGNOSIS — K819 Cholecystitis, unspecified: Secondary | ICD-10-CM

## 2018-01-11 DIAGNOSIS — E875 Hyperkalemia: Secondary | ICD-10-CM

## 2018-01-11 DIAGNOSIS — I5022 Chronic systolic (congestive) heart failure: Secondary | ICD-10-CM

## 2018-01-11 DIAGNOSIS — K7200 Acute and subacute hepatic failure without coma: Secondary | ICD-10-CM

## 2018-01-11 DIAGNOSIS — J9601 Acute respiratory failure with hypoxia: Secondary | ICD-10-CM

## 2018-01-11 DIAGNOSIS — R6521 Severe sepsis with septic shock: Secondary | ICD-10-CM

## 2018-01-11 DIAGNOSIS — A419 Sepsis, unspecified organism: Secondary | ICD-10-CM

## 2018-01-11 DIAGNOSIS — I482 Chronic atrial fibrillation: Secondary | ICD-10-CM

## 2018-01-11 LAB — RENAL FUNCTION PANEL
Albumin: 3.1 g/dL — ABNORMAL LOW (ref 3.5–5.0)
Albumin: 3.1 g/dL — ABNORMAL LOW (ref 3.5–5.0)
Albumin: 3.2 g/dL — ABNORMAL LOW (ref 3.5–5.0)
Anion gap: 19 — ABNORMAL HIGH (ref 5–15)
Anion gap: 21 — ABNORMAL HIGH (ref 5–15)
Anion gap: 29 — ABNORMAL HIGH (ref 5–15)
BUN: 30 mg/dL — ABNORMAL HIGH (ref 8–23)
BUN: 36 mg/dL — ABNORMAL HIGH (ref 8–23)
BUN: 43 mg/dL — ABNORMAL HIGH (ref 8–23)
CO2: 16 mmol/L — ABNORMAL LOW (ref 22–32)
CO2: 22 mmol/L (ref 22–32)
CO2: 23 mmol/L (ref 22–32)
Calcium: 7.9 mg/dL — ABNORMAL LOW (ref 8.9–10.3)
Calcium: 8.1 mg/dL — ABNORMAL LOW (ref 8.9–10.3)
Calcium: 8.4 mg/dL — ABNORMAL LOW (ref 8.9–10.3)
Chloride: 91 mmol/L — ABNORMAL LOW (ref 98–111)
Chloride: 92 mmol/L — ABNORMAL LOW (ref 98–111)
Chloride: 95 mmol/L — ABNORMAL LOW (ref 98–111)
Creatinine, Ser: 5.23 mg/dL — ABNORMAL HIGH (ref 0.61–1.24)
Creatinine, Ser: 6.63 mg/dL — ABNORMAL HIGH (ref 0.61–1.24)
Creatinine, Ser: 8.58 mg/dL — ABNORMAL HIGH (ref 0.61–1.24)
GFR calc Af Amer: 12 mL/min — ABNORMAL LOW (ref >60)
GFR calc Af Amer: 7 mL/min — ABNORMAL LOW (ref >60)
GFR calc Af Amer: 9 mL/min — ABNORMAL LOW (ref >60)
GFR calc non Af Amer: 10 mL/min — ABNORMAL LOW (ref >60)
GFR calc non Af Amer: 6 mL/min — ABNORMAL LOW (ref >60)
GFR calc non Af Amer: 8 mL/min — ABNORMAL LOW (ref >60)
Glucose, Bld: 136 mg/dL — ABNORMAL HIGH (ref 70–99)
Glucose, Bld: 145 mg/dL — ABNORMAL HIGH (ref 70–99)
Glucose, Bld: 186 mg/dL — ABNORMAL HIGH (ref 70–99)
Phosphorus: 4.2 mg/dL (ref 2.5–4.6)
Phosphorus: 5.3 mg/dL — ABNORMAL HIGH (ref 2.5–4.6)
Phosphorus: 8.2 mg/dL — ABNORMAL HIGH (ref 2.5–4.6)
Potassium: 4.8 mmol/L (ref 3.5–5.1)
Potassium: 5.4 mmol/L — ABNORMAL HIGH (ref 3.5–5.1)
Potassium: 6.2 mmol/L — ABNORMAL HIGH (ref 3.5–5.1)
Sodium: 135 mmol/L (ref 135–145)
Sodium: 136 mmol/L (ref 135–145)
Sodium: 137 mmol/L (ref 135–145)

## 2018-01-11 LAB — CBC
HCT: 29.3 % — ABNORMAL LOW (ref 39.0–52.0)
HCT: 29.8 % — ABNORMAL LOW (ref 39.0–52.0)
Hemoglobin: 9.6 g/dL — ABNORMAL LOW (ref 13.0–17.0)
Hemoglobin: 9.7 g/dL — ABNORMAL LOW (ref 13.0–17.0)
MCH: 29.6 pg (ref 26.0–34.0)
MCH: 29.6 pg (ref 26.0–34.0)
MCHC: 32.8 g/dL (ref 30.0–36.0)
MCHC: 32.6 g/dL (ref 30.0–36.0)
MCV: 90.4 fL (ref 78.0–100.0)
MCV: 90.9 fL (ref 78.0–100.0)
Platelets: 235 10*3/uL (ref 150–400)
Platelets: 234 10*3/uL (ref 150–400)
RBC: 3.24 MIL/uL — ABNORMAL LOW (ref 4.22–5.81)
RBC: 3.28 MIL/uL — ABNORMAL LOW (ref 4.22–5.81)
RDW: 16.2 % — ABNORMAL HIGH (ref 11.5–15.5)
RDW: 16.1 % — ABNORMAL HIGH (ref 11.5–15.5)
WBC: 16.8 10*3/uL — ABNORMAL HIGH (ref 4.0–10.5)
WBC: 16.8 10*3/uL — ABNORMAL HIGH (ref 4.0–10.5)

## 2018-01-11 LAB — COMPREHENSIVE METABOLIC PANEL
ALT: 7765 U/L — ABNORMAL HIGH (ref 0–44)
ALT: 7786 U/L — ABNORMAL HIGH (ref 0–44)
AST: 12598 U/L — ABNORMAL HIGH (ref 15–41)
AST: >10000 U/L — ABNORMAL HIGH (ref 15–41)
Albumin: 3.1 g/dL — ABNORMAL LOW (ref 3.5–5.0)
Albumin: 3.1 g/dL — ABNORMAL LOW (ref 3.5–5.0)
Alkaline Phosphatase: 162 U/L — ABNORMAL HIGH (ref 38–126)
Alkaline Phosphatase: 178 U/L — ABNORMAL HIGH (ref 38–126)
Anion gap: 27 — ABNORMAL HIGH (ref 5–15)
Anion gap: 21 — ABNORMAL HIGH (ref 5–15)
BUN: 40 mg/dL — ABNORMAL HIGH (ref 8–23)
BUN: 34 mg/dL — ABNORMAL HIGH (ref 8–23)
CO2: 18 mmol/L — ABNORMAL LOW (ref 22–32)
CO2: 22 mmol/L (ref 22–32)
Calcium: 8 mg/dL — ABNORMAL LOW (ref 8.9–10.3)
Calcium: 7.8 mg/dL — ABNORMAL LOW (ref 8.9–10.3)
Chloride: 91 mmol/L — ABNORMAL LOW (ref 98–111)
Chloride: 93 mmol/L — ABNORMAL LOW (ref 98–111)
Creatinine, Ser: 8 mg/dL — ABNORMAL HIGH (ref 0.61–1.24)
Creatinine, Ser: 6.65 mg/dL — ABNORMAL HIGH (ref 0.61–1.24)
GFR calc Af Amer: 7 mL/min — ABNORMAL LOW (ref >60)
GFR calc Af Amer: 9 mL/min — ABNORMAL LOW (ref >60)
GFR calc non Af Amer: 6 mL/min — ABNORMAL LOW (ref >60)
GFR calc non Af Amer: 8 mL/min — ABNORMAL LOW (ref >60)
Glucose, Bld: 189 mg/dL — ABNORMAL HIGH (ref 70–99)
Glucose, Bld: 186 mg/dL — ABNORMAL HIGH (ref 70–99)
Potassium: 6 mmol/L — ABNORMAL HIGH (ref 3.5–5.1)
Potassium: 5.4 mmol/L — ABNORMAL HIGH (ref 3.5–5.1)
Sodium: 136 mmol/L (ref 135–145)
Sodium: 136 mmol/L (ref 135–145)
Total Bilirubin: 3 mg/dL — ABNORMAL HIGH (ref 0.3–1.2)
Total Bilirubin: 3.2 mg/dL — ABNORMAL HIGH (ref 0.3–1.2)
Total Protein: 7.1 g/dL (ref 6.5–8.1)
Total Protein: 7.3 g/dL (ref 6.5–8.1)

## 2018-01-11 LAB — POCT I-STAT 3, ART BLOOD GAS (G3+)
Acid-base deficit: 10 mmol/L — ABNORMAL HIGH (ref 0.0–2.0)
Acid-base deficit: 12 mmol/L — ABNORMAL HIGH (ref 0.0–2.0)
Acid-base deficit: 12 mmol/L — ABNORMAL HIGH (ref 0.0–2.0)
Acid-base deficit: 2 mmol/L (ref 0.0–2.0)
Acid-base deficit: 9 mmol/L — ABNORMAL HIGH (ref 0.0–2.0)
Bicarbonate: 12.6 mmol/L — ABNORMAL LOW (ref 20.0–28.0)
Bicarbonate: 14.9 mmol/L — ABNORMAL LOW (ref 20.0–28.0)
Bicarbonate: 15.5 mmol/L — ABNORMAL LOW (ref 20.0–28.0)
Bicarbonate: 15.6 mmol/L — ABNORMAL LOW (ref 20.0–28.0)
Bicarbonate: 21.5 mmol/L (ref 20.0–28.0)
Bicarbonate: 24.3 mmol/L (ref 20.0–28.0)
O2 Saturation: 100 %
O2 Saturation: 100 %
O2 Saturation: 100 %
O2 Saturation: 79 %
O2 Saturation: 97 %
O2 Saturation: 98 %
Patient temperature: 97.9
Patient temperature: 98.4
Patient temperature: 98.9
Patient temperature: 99.8
TCO2: 13 mmol/L — ABNORMAL LOW (ref 22–32)
TCO2: 16 mmol/L — ABNORMAL LOW (ref 22–32)
TCO2: 16 mmol/L — ABNORMAL LOW (ref 22–32)
TCO2: 17 mmol/L — ABNORMAL LOW (ref 22–32)
TCO2: 23 mmol/L (ref 22–32)
TCO2: 25 mmol/L (ref 22–32)
pCO2 arterial: 23.5 mmHg — ABNORMAL LOW (ref 32.0–48.0)
pCO2 arterial: 27.4 mmHg — ABNORMAL LOW (ref 32.0–48.0)
pCO2 arterial: 28.2 mmHg — ABNORMAL LOW (ref 32.0–48.0)
pCO2 arterial: 34.5 mmHg (ref 32.0–48.0)
pCO2 arterial: 36.7 mmHg (ref 32.0–48.0)
pCO2 arterial: 40.6 mmHg (ref 32.0–48.0)
pH, Arterial: 7.191 — CL (ref 7.350–7.450)
pH, Arterial: 7.337 — ABNORMAL LOW (ref 7.350–7.450)
pH, Arterial: 7.343 — ABNORMAL LOW (ref 7.350–7.450)
pH, Arterial: 7.349 — ABNORMAL LOW (ref 7.350–7.450)
pH, Arterial: 7.406 (ref 7.350–7.450)
pH, Arterial: 7.429 (ref 7.350–7.450)
pO2, Arterial: 109 mmHg — ABNORMAL HIGH (ref 83.0–108.0)
pO2, Arterial: 191 mmHg — ABNORMAL HIGH (ref 83.0–108.0)
pO2, Arterial: 204 mmHg — ABNORMAL HIGH (ref 83.0–108.0)
pO2, Arterial: 243 mmHg — ABNORMAL HIGH (ref 83.0–108.0)
pO2, Arterial: 42 mmHg — ABNORMAL LOW (ref 83.0–108.0)
pO2, Arterial: 99 mmHg (ref 83.0–108.0)

## 2018-01-11 LAB — PREPARE FRESH FROZEN PLASMA
Unit division: 0
Unit division: 0
Unit division: 0
Unit division: 0
Unit division: 0

## 2018-01-11 LAB — POCT I-STAT, CHEM 8
BUN: 42 mg/dL — ABNORMAL HIGH (ref 8–23)
Calcium, Ion: 0.88 mmol/L — CL (ref 1.15–1.40)
Chloride: 96 mmol/L — ABNORMAL LOW (ref 98–111)
Creatinine, Ser: 8.9 mg/dL — ABNORMAL HIGH (ref 0.61–1.24)
Glucose, Bld: 143 mg/dL — ABNORMAL HIGH (ref 70–99)
HCT: 34 % — ABNORMAL LOW (ref 39.0–52.0)
Hemoglobin: 11.6 g/dL — ABNORMAL LOW (ref 13.0–17.0)
Potassium: 6.3 mmol/L (ref 3.5–5.1)
Sodium: 132 mmol/L — ABNORMAL LOW (ref 135–145)
TCO2: 19 mmol/L — ABNORMAL LOW (ref 22–32)

## 2018-01-11 LAB — BPAM FFP
Blood Product Expiration Date: 201908142359
Blood Product Expiration Date: 201908152359
Blood Product Expiration Date: 201908172359
Blood Product Expiration Date: 201908172359
Blood Product Expiration Date: 201908172359
Blood Product Expiration Date: 201908172359
ISSUE DATE / TIME: 201908120145
ISSUE DATE / TIME: 201908120223
ISSUE DATE / TIME: 201908120508
ISSUE DATE / TIME: 201908120508
ISSUE DATE / TIME: 201908120755
ISSUE DATE / TIME: 201908120832
Unit Type and Rh: 6200
Unit Type and Rh: 6200
Unit Type and Rh: 6200
Unit Type and Rh: 6200
Unit Type and Rh: 6200
Unit Type and Rh: 6200

## 2018-01-11 LAB — PROTIME-INR
INR: 3.41
INR: 4.28
Prothrombin Time: 34.1 seconds — ABNORMAL HIGH (ref 11.4–15.2)
Prothrombin Time: 40.8 seconds — ABNORMAL HIGH (ref 11.4–15.2)

## 2018-01-11 LAB — GLUCOSE, CAPILLARY
Glucose-Capillary: 168 mg/dL — ABNORMAL HIGH (ref 70–99)
Glucose-Capillary: 188 mg/dL — ABNORMAL HIGH (ref 70–99)
Glucose-Capillary: 180 mg/dL — ABNORMAL HIGH (ref 70–99)
Glucose-Capillary: 163 mg/dL — ABNORMAL HIGH (ref 70–99)
Glucose-Capillary: 134 mg/dL — ABNORMAL HIGH (ref 70–99)
Glucose-Capillary: 84 mg/dL (ref 70–99)

## 2018-01-11 LAB — LACTIC ACID, PLASMA
Lactic Acid, Venous: 10.8 mmol/L (ref 0.5–1.9)
Lactic Acid, Venous: 8.5 mmol/L (ref 0.5–1.9)

## 2018-01-11 LAB — PROCALCITONIN: Procalcitonin: 21.21 ng/mL

## 2018-01-11 LAB — TROPONIN I: Troponin I: 2.35 ng/mL (ref <0.03)

## 2018-01-11 LAB — PHOSPHORUS: Phosphorus: 8.1 mg/dL — ABNORMAL HIGH (ref 2.5–4.6)

## 2018-01-11 LAB — MAGNESIUM
Magnesium: 2.2 mg/dL (ref 1.7–2.4)
Magnesium: 2.2 mg/dL (ref 1.7–2.4)

## 2018-01-11 LAB — LIPASE, BLOOD: Lipase: 117 U/L — ABNORMAL HIGH (ref 11–51)

## 2018-01-11 MED ORDER — DEXTROSE 5 % IV SOLN
INTRAVENOUS | Status: DC
  Administered 2018-01-11: 02:00:00 via INTRAVENOUS
  Filled 2018-01-11 (×2): qty 150

## 2018-01-11 MED ORDER — PIPERACILLIN-TAZOBACTAM 3.375 G IVPB 30 MIN
3.3750 g | Freq: Four times a day (QID) | INTRAVENOUS | Status: DC
  Administered 2018-01-11 – 2018-01-14 (×13): 3.375 g via INTRAVENOUS
  Filled 2018-01-11 (×14): qty 50

## 2018-01-11 MED ORDER — VITAMIN K1 10 MG/ML IJ SOLN
10.0000 mg | Freq: Once | INTRAVENOUS | Status: AC
  Administered 2018-01-11: 10 mg via INTRAVENOUS
  Filled 2018-01-11: qty 1

## 2018-01-11 MED ORDER — SODIUM CHLORIDE 0.9 % IV SOLN
2.0000 g | Freq: Once | INTRAVENOUS | Status: AC
  Administered 2018-01-11: 2 g via INTRAVENOUS
  Filled 2018-01-11: qty 20

## 2018-01-11 MED ORDER — CALCIUM GLUCONATE 10 % IV SOLN: 2.0000 g | Freq: Once | INTRAVENOUS | Status: DC

## 2018-01-11 MED ORDER — SODIUM BICARBONATE 8.4 % IV SOLN
50.0000 meq | Freq: Once | INTRAVENOUS | Status: AC
  Administered 2018-01-11: 50 meq via INTRAVENOUS
  Filled 2018-01-11: qty 100

## 2018-01-11 MED ORDER — INSULIN ASPART 100 UNIT/ML ~~LOC~~ SOLN
10.0000 [IU] | Freq: Once | SUBCUTANEOUS | Status: AC
  Administered 2018-01-11: 10 [IU] via SUBCUTANEOUS

## 2018-01-11 MED ORDER — HEPARIN SODIUM (PORCINE) 1000 UNIT/ML IJ SOLN
2800.0000 [IU] | Freq: Once | INTRAMUSCULAR | Status: AC
  Administered 2018-01-11: 2800 [IU] via INTRAVENOUS

## 2018-01-11 MED ORDER — DEXTROSE 50 % IV SOLN
INTRAVENOUS | Status: AC
  Administered 2018-01-11: 50 mL
  Filled 2018-01-11: qty 50

## 2018-01-11 MED ORDER — VANCOMYCIN HCL IN DEXTROSE 1-5 GM/200ML-% IV SOLN
1000.0000 mg | INTRAVENOUS | Status: DC
  Administered 2018-01-12: 1000 mg via INTRAVENOUS
  Filled 2018-01-11: qty 200

## 2018-01-11 MED ORDER — SODIUM CHLORIDE 0.9% IV SOLUTION
Freq: Once | INTRAVENOUS | Status: AC
  Administered 2018-01-11: 03:00:00 via INTRAVENOUS

## 2018-01-11 MED ORDER — VANCOMYCIN HCL IN DEXTROSE 1-5 GM/200ML-% IV SOLN
1000.0000 mg | INTRAVENOUS | Status: DC
  Filled 2018-01-11: qty 200

## 2018-01-11 MED ORDER — SODIUM CHLORIDE 0.9 % IV SOLN
INTRAVENOUS | Status: DC | PRN
  Administered 2018-01-13 (×2): 250 mL via INTRAVENOUS_CENTRAL

## 2018-01-11 MED ORDER — PRISMASOL BGK 0/2.5 32-2.5 MEQ/L IV SOLN
INTRAVENOUS | Status: DC
  Administered 2018-01-11 – 2018-01-13 (×3): via INTRAVENOUS_CENTRAL
  Filled 2018-01-11 (×6): qty 5000

## 2018-01-11 MED ORDER — PRISMASOL BGK 0/2.5 32-2.5 MEQ/L IV SOLN
INTRAVENOUS | Status: DC
  Administered 2018-01-11 – 2018-01-13 (×6): via INTRAVENOUS_CENTRAL
  Filled 2018-01-11 (×10): qty 5000

## 2018-01-11 MED ORDER — PRISMASOL BGK 4/2.5 32-4-2.5 MEQ/L IV SOLN
INTRAVENOUS | Status: DC
  Administered 2018-01-11 – 2018-01-13 (×22): via INTRAVENOUS_CENTRAL
  Filled 2018-01-11 (×35): qty 5000

## 2018-01-11 MED ORDER — HEPARIN SODIUM (PORCINE) 1000 UNIT/ML DIALYSIS
1000.0000 [IU] | INTRAMUSCULAR | Status: DC | PRN
  Administered 2018-01-13: 2800 [IU] via INTRAVENOUS_CENTRAL
  Filled 2018-01-11: qty 2
  Filled 2018-01-11: qty 6
  Filled 2018-01-11: qty 4
  Filled 2018-01-11: qty 2
  Filled 2018-01-11: qty 6

## 2018-01-11 NOTE — Progress Notes (Addendum)
      [address]       [place] [address]             [phone]        CARDIOTHORACIC SURGERY PROGRESS NOTE   R1 Day Post-Op Procedure(s) (LRB): SUBXYPHOID PERICARDIAL WINDOW (N/A)  Subjective: Sedated on vent but wakes up and follows commands.  Denies abdominal pain.  Objective: Vital signs: BP Readings from Last 1 Encounters:  01/11/18 (!) 125/58   Pulse Readings from Last 1 Encounters:  01/11/18 (!) 110   Resp Readings from Last 1 Encounters:  01/11/18 (!) 28   Temp Readings from Last 1 Encounters:  01/11/18 99.8 F (37.7 C) (Oral)    Hemodynamics: CVP:  [13 mmHg-23 mmHg] 13 mmHg  Physical Exam:  Rhythm:   Afib w/ controlled rate  Breath sounds: clear  Heart sounds:  irregular  Incisions:  Dressing intact, minimal chest tube output  Abdomen:  Soft, mildly-distended, non-tender  Extremities:  Warm, adequately perfused   Intake/Output from previous day: 08/12 0701 - 08/13 0700 In: 4880.8 [I.V.:2891; Blood:1628.7; IV Piggyback:361.1] Out: 1709 [Blood:1100; Chest Tube:133] Intake/Output this shift: Total I/O In: 328.4 [I.V.:328.4] Out: 490 [Other:490]  Lab Results:  CBC: Recent Labs    01/11/18 0412 01/11/18 0815  WBC 16.8* 16.8*  HGB 9.6* 9.7*  HCT 29.3* 29.8*  PLT 235 234    BMET:  Recent Labs    01/11/18 0412 01/11/18 0815  NA 136 135  K 6.0* 5.4*  CL 91* 92*  CO2 18* 22  GLUCOSE 189* 186*  BUN 40* 36*  CREATININE 8.00* 6.63*  CALCIUM 8.0* 7.9*     PT/INR:   Recent Labs    01/11/18 0412  LABPROT 34.1*  INR 3.41    CBG (last 3)  Recent Labs    01/11/18 0124 01/11/18 0346 01/11/18 0826  GLUCAP 168* 188* 180*    ABG    Component Value Date/Time   PHART 7.134 (LL) 01/10/2018 1011   PCO2ART 46.6 01/10/2018 1011   PO2ART 310.0 (H) 01/10/2018 1011   HCO3 15.8 (L) 01/10/2018 1011   TCO2 17 (L) 01/10/2018 1011   ACIDBASEDEF 13.0 (H) 01/10/2018 1011   O2SAT 100.0 01/10/2018 1011    CXR: PORTABLE  CHEST 1 VIEW  COMPARISON:  None.  FINDINGS: Support devices are stable. Mild cardiomegaly, vascular congestion and bibasilar atelectasis, slightly improved since prior study.  IMPRESSION: Improving vascular congestion and bibasilar atelectasis.   Electronically Signed   By: Rolm Baptise [name]   On: 01/11/2018 07:31  Assessment/Plan: S/P Procedure(s) (LRB): SUBXYPHOID PERICARDIAL WINDOW (N/A)  Hemodynamics seem to be improving although the patient remains on pressors.  Chest x-ray looks remarkably clear.  Minimal drainage from pericardial drain.  Severely elevated transaminases suggestive of acute liver injury secondary to cardiogenic shock.  White blood count is also elevated, but this could likely be related to shock liver and SIRS.  The possibility of acute cholecystitis cannot be definitively ruled out, but I agree with plans to hold off on percutaneous cholecystostomy tube at this time.  I do not think it would be wise to try to take any volume off at this time, and the patient's hemodynamic's might actually improve with some volume administration.  We will continue to follow.  Rexene Alberts, MD 01/11/2018 9:48 AM

## 2018-01-11 NOTE — Progress Notes (Signed)
Initial Nutrition Assessment  DOCUMENTATION CODES:   Obesity unspecified  INTERVENTION:   Recommend trial of trickle TF; consider post-pyloric Cortrak placement, even LOT placement if necessary  Goal Tube Feeding:  Vital High Protein @ 45 ml/hr Pro-Stat 60 mL TID (6 packets/day) Provides 185 g of protein, 1680 kcals and 907 mL of free water Meets 100% protein needs, 108% calorie needs   NUTRITION DIAGNOSIS:   Inadequate oral intake related to acute illness as evidenced by NPO status.  GOAL:   Patient will meet greater than or equal to 90% of their needs  MONITOR:   Labs, Weight trends, Vent status  REASON FOR ASSESSMENT:   Ventilator    ASSESSMENT:   67 yo male admitted with septic and cardiogenic shock, acute respiratory failure requiring vent support, pericaridal effusion taking emergently to OR. Pt with hx of ESRD on HD, DM, HTN, NICM EF 20%, GERD, HLD, PVD  8/11 Presented to [facility] abdominal pain, shock, N/V, pneumonia; transferred to [facility] 8/12 Decompensated, intubated, OR for pericardial window 8/13 CRRT started  Patient is currently intubated on ventilator support, fentanyl, levophed and vasopressin MV: 17.9 L/min Temp (24hrs), Avg:98.9 F (37.2 C), Min:97.8 F (36.6 C), Max:100.2 F (37.9 C)  Dry wt not listed in patient chart; current wt 114.4 kg, up from admission wt of 111.1 kg. Not other weight encounters from the 12 months. Utilizing EDW 111 kg at present  GI consulted. Cholelithiasis, possible cholecystitis. Shock liver, Pancreatitis. Lipase 117 .Noted possible need  For perc chole tube No BM since 8/19 Abdominal xray on 8/12 with normal bowel gas pattern, OG tube in stomach  Labs: potassium 5.4, phosphorus 5.3, lipase 117, elevated AST/ALT Meds: reviewed  NUTRITION - FOCUSED PHYSICAL EXAM:  Unable to perform at this time  Diet Order:   Diet Order            Diet NPO time specified  Diet effective now               EDUCATION NEEDS:   Not appropriate for education at this time  Skin:  Skin Assessment: Skin Integrity Issues: Skin Integrity Issues:: Incisions Incisions: chest  Last BM:  8/9  Height:   Ht Readings from Last 1 Encounters:  01/09/18 6\' 3"  (1.905 m)    Weight:   Wt Readings from Last 1 Encounters:  01/11/18 114.4 kg    Ideal Body Weight:     BMI:  Body mass index is 31.52 kg/m.  Estimated Nutritional Needs:   Kcal:  5597-4163 kcals   Protein:  178-200 g  Fluid:  >/= 1.8 L   Kerman Passey MS, RD, LDN, CNSC [phone] Pager  [phone] Weekend/On-Call Pager

## 2018-01-11 NOTE — H&P (Addendum)
PULMONARY / CRITICAL CARE MEDICINE   Name: [name] MRN: [id number] DOB: 1950/10/31    ADMISSION DATE:  01/09/2018 CONSULTATION DATE: 01/09/18  REFERRING MD: Transfer from [place]: Shock  HISTORY OF PRESENT ILLNESS:   67yoM with hx ESRD on HD (last HD on 01/08/18), OSA, PVD, NHL (2009), DM, HTN, GERD, Anemia, CHF (EF 20%), LE DVT (remote 18yrs ago per pt) on Coumadin, who presented to [place] c/o "feeling bad" since dialysis on 8/10. When EMS was called, they found him to have BP 80/30 which improved to 100/30 following 500cc IVF bolus. In the OSH ER, patient again became hypotensive requiring initiation of levophed via a PIV. CXR showed a LLL infiltrate, for which he was started on Vanc and Zosyn then transferred to [facility] ICU.   On arrival, patient c/o 10/10 severe diffuse abdominal pain that has been constant x 1 day; also c/o nonbloody N/V. Denies diarrhea, CP, SOB, Cough, F/C. He is currently writhing in bed and dry heaving at time of my exam. Levophed @ 75mcg via a PIV.    SUBJECTIVE:  RN reports pauses overnight, acidosis / AKI requiring CVVHD.  Pt remains on levophed 34 mcg's, vasopressin gtt's.  On fentanyl gtt for pain.  Family at bedside.    VITAL SIGNS: BP (!) 125/58   Pulse (!) 110   Temp 99.8 F (37.7 C) (Oral)   Resp (!) 28   Ht 6\' 3"  (1.905 m)   Wt 114.4 kg   SpO2 100%   BMI 31.52 kg/m   HEMODYNAMICS: CVP:  [13 mmHg-23 mmHg] 13 mmHgLevophed @ 79mcg  INTAKE / OUTPUT: I/O last 3 completed shifts: In: 8590.5 [I.V.:4797.3; Blood:2652.7; IV Piggyback:1140.6] Out: 1448 [Other:476; Blood:1100; Chest Tube:133]  PHYSICAL EXAMINATION: General: critically ill appearing male lying in bed HEENT: MM pink/moist, ETT, catheters intact in neck c/d/i Neuro: sedate CV: s1s2 rrr, distant tones, no m/r/g, midline incision c/d/i, drain in place with serosanguinous drainage  PULM: even/non-labored, lungs bilaterally clear anterior,  diminished bases  JE:HUDJ, non-tender, bsx4 active  Extremities: warm/dry, no edema  Skin: no rashes or lesions  LABS:  BMET Recent Labs  Lab 01/10/18 0713 01/10/18 1011 01/11/18 0005 01/11/18 0412  NA 138 133* 136 136  K 4.9 4.2 6.2* 6.0*  CL 90*  --  91* 91*  CO2 17*  --  16* 18*  BUN 33*  --  43* 40*  CREATININE 7.42*  --  8.58* 8.00*  GLUCOSE 221*  --  145* 189*   Electrolytes Recent Labs  Lab 01/10/18 0354 01/10/18 0713 01/11/18 0005 01/11/18 0008 01/11/18 0412  CALCIUM 8.4* 8.6* 8.4*  --  8.0*  MG 2.2  --   --  2.2 2.2  PHOS 8.0*  --  8.2* 8.1*  --    CBC Recent Labs  Lab 01/10/18 0713 01/10/18 1011 01/11/18 0412 01/11/18 0815  WBC 15.1*  --  16.8* 16.8*  HGB 12.3* 9.5* 9.6* 9.7*  HCT 40.5 28.0* 29.3* 29.8*  PLT 259  --  235 234   Coag's Recent Labs  Lab 01/10/18 0633 01/11/18 0008 01/11/18 0412  INR 3.91 4.28* 3.41   Sepsis Markers Recent Labs  Lab 01/09/18 1532  01/10/18 0354 01/10/18 0713 01/11/18 0005 01/11/18 0008 01/11/18 0305  LATICACIDVEN  --    < > 10.9* 12.7* 10.8*  --  8.5*  PROCALCITON 1.44  --  3.04  --   --  21.21  --    < > =  values in this interval not displayed.   ABG Recent Labs  Lab 01/09/18 2330 01/10/18 0316 01/10/18 1011  PHART 7.208* 7.543* 7.134*  PCO2ART 42.4 <15.0* 46.6  PO2ART 19.0* 43.0* 310.0*    Liver Enzymes Recent Labs  Lab 01/09/18 1532 01/09/18 2247 01/11/18 0005 01/11/18 0412  AST 109* 434*  --  12,598*  ALT 93* 418*  --  7,765*  ALKPHOS 158* 157*  --  162*  BILITOT 1.6* 2.0*  --  3.0*  ALBUMIN 3.2* 2.7* 3.1* 3.1*   Cardiac Enzymes Recent Labs  Lab 01/09/18 2217 01/10/18 0354 01/11/18 0412  TROPONINI 0.11* 0.33* 2.35*    Glucose Recent Labs  Lab 01/10/18 1605 01/10/18 1954 01/10/18 2317 01/11/18 0124 01/11/18 0346 01/11/18 0826  GLUCAP 141* 108* 136* 168* 188* 180*   Imaging Dg Chest Port 1 View  Result Date: 01/11/2018 CLINICAL DATA:  Chest tube, pericardial  drain EXAM: PORTABLE CHEST 1 VIEW COMPARISON:  None. FINDINGS: Support devices are stable. Mild cardiomegaly, vascular congestion and bibasilar atelectasis, slightly improved since prior study. IMPRESSION: Improving vascular congestion and bibasilar atelectasis. Electronically Signed   By: Rolm Baptise [name]   On: 01/11/2018 07:31   Dg Chest Port 1 View  Result Date: 01/11/2018 CLINICAL DATA:  Central line placement EXAM: PORTABLE CHEST 1 VIEW COMPARISON:  01/10/2018 FINDINGS: Endotracheal tube with tip measuring 6.5 cm above the carina. Enteric tube tip is off the field of view but below the left hemidiaphragm. Left central venous catheter is in place with tip over the low SVC region. A new right central venous catheter is placed with tip over the mid SVC region. No pneumothorax. Mild cardiac enlargement. No vascular congestion. Atelectasis in the lung bases. Aortic calcification. IMPRESSION: Appliances appear in satisfactory position. Cardiac enlargement. Atelectasis in the lung bases. No pneumothorax. Electronically Signed   By: Lucienne Capers [name]   On: 01/11/2018 03:33   Dg Chest Port 1 View  Result Date: 01/10/2018 CLINICAL DATA:  Acute hypoxic respiratory failure. Shock. Pericardial effusion. EXAM: PORTABLE CHEST 1 VIEW COMPARISON:  Chest x-rays dated 01/10/2018 and CT scan of the abdomen dated 01/10/2018 FINDINGS: Endotracheal tube tip is 5.4 cm above the carina. Left central line tip is at the cavoatrial junction, unchanged. NG tube tip is in the fundus of the stomach. There is a tube at the left lung base which may represent a tube in the pericardial space. Cardiac silhouette is prominent but unchanged. Pulmonary vascularity is normal. Persistent consolidation in the left lower lobe. Right lung is clear. IMPRESSION: 1. Persistent left lower lobe pneumonia. 2. Persistent prominence of the cardiac silhouette. There appears to be a pericardial drainage tube in place. 3. Other support apparatus  appears in good position. Electronically Signed   By: Lorriane Shire [name]   On: 01/10/2018 11:29   STUDIES:  CT Abdomen/Pelvis w IV contrast 8/12 >> cholelithiasis with gallbladder wall thickening, pericholecystic edema suggestive of cholecystitis, infiltration in the peripancreatic fat consistent with acute pancreatitis, no loculated collections, diffuse fatty infiltration of the liver, small left pleural effusion, consolidation in the left lung base, large pericardial effusion, small periumbilical hernia containing fat  CULTURES: BCx2 8/11 >>  Sputum 8/11 >>  UA 8/11 >>  Pericardial Fluid 8/12 >>   ANTIBIOTICS: Vanc 8/11 >> Zosyn 8/11 >>  SIGNIFICANT EVENTS: 8/11 Presented to [facility] ER with abd pain, shock, N/V >> diagnosed with pneumonia and transferred to [facility] ICU 8/12  Patient decompensated overnight, intubated > to OR for pericardial  window.   8/13  Pauses in early am, CVVHD started   LINES/TUBES: RUE AV fistula LIJ TLC 8/12 >>  DISCUSSION: 66yoM with hx ESRD on HD (last HD on 01/08/18), OSA, PVD, NHL (2009), DM, HTN, GERD, Anemia, CHF (EF 20%), LE DVT (remote 33yrs ago per pt) on Coumadin, who presented to [place] c/o "feeling bad" since dialysis on 8/10. When EMS was called, they found him to have BP 80/30 which improved to 100/30 following 500cc IVF bolus. In the OSH ER, patient again became hypotensive requiring initiation of levophed via a PIV. CXR showed a LLL infiltrate, for which he was started on Vanc and Zosyn then transferred to [facility] ICU.   On arrival, patient c/o 10/10 severe diffuse abdominal pain that has been constant x 1 day; also c/o nonbloody N/V. Denies diarrhea, CP, SOB, Cough, F/C. Decompensated requiring intubation.  Pericardial effusion identified > pt to OR for pericardial drain 8/12.  Had worsening acidosis / AKI requiring CVVHD.  CT ABD concerning for cholecystitis & pancreatitis with transaminitis.  CCS discussed case with Dr.  Nelda Marseille (8/12) and he was felt to be too sick for surgical intervention at that time.     ASSESSMENT / PLAN:  Acute Hypoxic Respiratory Failure LLL Pneumonia P: ABG now  PRVC 8 cc/kg  Increase FiO2 to 70% with PEEP 10 > PO2 on abg 42 (despite SaO2 of 100%) ABX as above  Follow intermittent CXR  Not a candidate for weaning / extubation at this time   Shock - suspect cardiogenic as primary, +/- septic component  AFib RVR (new diagnosis) - converted 8/13 Hx HTN and CHF (EF 20%) QT prolongation P: ICU monitoring Levophed + vasopressin for MAP >65 Stress dose steroids  KVO IVF / discontinue bicarb gtt   ESRD on HD AGMA / Lactic Acidosis  P: Appreciate Nephrology input  Discontinue Bicarb gtt Trend BMP / urinary output Replace electrolytes as indicated Trend lactate  Abdominal Pain - diffuse on admit, 10/10 pain Nausea / Vomiting  Transaminitis Cholecystitis  Pancreatitis  Shock Liver  P: Hold TF for now, consider trickle feeding late pm or in am  NPO / OGT to LIS GI consulted  May need IR perc drain for gallbladder  Trend LFT's, lipase   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    - Inter-disciplinary family meet or Palliative Care meeting due by: 01/16/18  CC Time:  35 minutes   Noe Gens, NP-C [place] [facility] Pgr: [phone] or if no answer [phone] 01/11/2018, 9:04 AM  Attending Note:  67 year old male with PMH of  ESRD who presents to PCCM in septic shock likely from gal bladder and concern for ischemic bowel.  Overnight, acidosis is worse and patient was started on CRRT after placement of HD catheter.  On exam, responsive, following some commands.  I reviewed CXR myself, ETT is  in a good position.  Discussed with PCCM-NP.  Will continue CRRT today with even volume.  Pressor support as ordered.  Abx.  F/U on cultures.  GI to evaluate patient given elevated LFTs.  PCCM will continue to follow.  The patient is critically ill with multiple organ systems failure and requires high complexity decision making for assessment and support, frequent evaluation and titration of therapies, application of advanced monitoring technologies and extensive interpretation of multiple databases.   Critical Care Time devoted to patient care services described in this note is  34  Minutes. This time reflects time of care of this signee Dr Jennet Maduro. This critical care time does not reflect procedure time, or teaching time or supervisory time of PA/NP/Med student/Med Resident etc but could involve care discussion time.  Rush Farmer, [name] [facility] Pulmonary/Critical Care Medicine. Pager: [phone]. After hours pager: [phone].

## 2018-01-11 NOTE — Progress Notes (Signed)
Renal Assessment:  1 ESRD--now on CRRT 2 s/p pericardial effusion drainage 3 nonfunctioning RUE AVF, clotted(needs intervention s/p temp cath placement  4 VDRF 5 Shock on pressors 6 coagulopathy 7 pos fluid balance 8 Acidosis--now on CRRT  Plan: 1 Cont CRRT support -50cc/hr and "low" K bath  Subjective: Interval History: Started CRRT last PM via a TC  Objective: Vital signs in last 24 hours: Temp:  [97.5 F (36.4 C)-100.2 F (37.9 C)] 99.8 F (37.7 C) (08/13 0400) Pulse Rate:  [91-116] 110 (08/13 0900) Resp:  [25-34] 28 (08/13 0900) BP: (68-135)/(46-83) 125/58 (08/13 0824) SpO2:  [89 %-100 %] 100 % (08/13 0900) Arterial Line BP: (61-154)/(32-64) 137/55 (08/13 0700) FiO2 (%):  [50 %-100 %] 55 % (08/13 0824) Weight:  [114.4 kg] 114.4 kg (08/13 0500) Weight change: 3.269 kg  Intake/Output from previous day: 08/12 0701 - 08/13 0700 In: 4880.8 [I.V.:2891; Blood:1628.7; IV Piggyback:361.1] Out: 1709 [Blood:1100; Chest Tube:133] Intake/Output this shift: Total I/O In: 328.4 [I.V.:328.4] Out: 490 [Other:490]  General appearance: opens eyes Chest wall: subxyphoid drain Extremities: edema 1-2+  Lab Results: Recent Labs    01/11/18 0412 01/11/18 0815  WBC 16.8* 16.8*  HGB 9.6* 9.7*  HCT 29.3* 29.8*  PLT 235 234   BMET:  Recent Labs    01/11/18 0412 01/11/18 0815  NA 136 135  K 6.0* 5.4*  CL 91* 92*  CO2 18* 22  GLUCOSE 189* 186*  BUN 40* 36*  CREATININE 8.00* 6.63*  CALCIUM 8.0* 7.9*   No results for input(s): PTH in the last 72 hours. Iron Studies: No results for input(s): IRON, TIBC, TRANSFERRIN, FERRITIN in the last 72 hours. Studies/Results: Dg Chest 2 View  Result Date: 01/09/2018 CLINICAL DATA:  Dialysis treatment today. Patient not feeling well since that time. EXAM: CHEST - 2 VIEW COMPARISON:  March 05, 2016 FINDINGS: Stable cardiomegaly. Increased opacity in left retrocardiac region may represent atelectasis or infiltrate. This is best seen  on the lateral view. The hila and mediastinum are normal. No pneumothorax. No pulmonary nodules or masses. IMPRESSION: Left retrocardiac opacity could represent atelectasis or infiltrate. Recommend clinical correlation and follow-up to resolution. Electronically Signed   By: Dorise Bullion III M.D   On: 01/09/2018 16:08   Dg Abd 1 View  Result Date: 01/10/2018 CLINICAL DATA:  Orogastric tube placement EXAM: ABDOMEN - 1 VIEW COMPARISON:  CT from earlier the same day FINDINGS: Enteric tube has been advanced into the stomach which is nondilated. Visualized bowel gas pattern normal. IVC filter at the L2 level. IMPRESSION: Enteric tube to the gastric fundus.  Normal bowel gas pattern. Electronically Signed   By: Lucrezia Europe [name]   On: 01/10/2018 08:00   Dg Abd 1 View  Result Date: 01/09/2018 CLINICAL DATA:  Abdominal pain EXAM: ABDOMEN - 1 VIEW COMPARISON:  None. FINDINGS: Scattered large and small bowel gas is noted. IVC filter is noted in place. Diffuse vascular calcifications are seen. No acute bony abnormality is noted. IMPRESSION: No acute abnormality seen. Electronically Signed   By: Inez Catalina [name]   On: 01/09/2018 23:13   Ct Abdomen Pelvis W Contrast  Result Date: 01/10/2018 CLINICAL DATA:  Abdominal pain, sepsis, hypotension. Severe acute abdominal pain and shock. History of end-stage renal disease on hemodialysis. EXAM: CT ABDOMEN AND PELVIS WITH CONTRAST TECHNIQUE: Multidetector CT imaging of the abdomen and pelvis was performed using the standard protocol following bolus administration of intravenous contrast. CONTRAST:  120m OMNIPAQUE IOHEXOL 300 MG/ML  SOLN COMPARISON:  None.  FINDINGS: Lower chest: Small left pleural effusion. Consolidation in the left lung base may be due to atelectasis or pneumonia. Less prominent atelectasis or infiltration in the right lung base. Large pericardial effusion. Heart size is normal. Hepatobiliary: Diffuse fatty infiltration of the liver. There is a large  stone in the gallbladder. Gallbladder wall is thickened and there is edema around the gallbladder suggesting cholecystitis. No bile duct dilatation. Pancreas: Hazy outline of the pancreas particularly around the head with infiltration in the peripancreatic fat. Changes are consistent with acute pancreatitis. No loculated collections identified. Spleen: Normal in size without focal abnormality. Adrenals/Urinary Tract: Adrenal glands are unremarkable. Kidneys are normal, without renal calculi, focal lesion, or hydronephrosis. Bladder is decompressed. Stomach/Bowel: Stomach, small bowel, and colon are not abnormally distended. No wall thickening is identified although under distention limits evaluation of bowel wall. No inflammatory changes suggested. Appendix is normal. Vascular/Lymphatic: Aortic atherosclerosis. Prominent vascular calcifications throughout the abdomen and pelvis. No enlarged abdominal or pelvic lymph nodes. Inferior vena caval filter present. Reproductive: Prostate is unremarkable. Other: No free air or free fluid in the abdomen. Small periumbilical hernias containing fat. Lipoma in the left inguinal canal. Musculoskeletal: Degenerative changes in the spine. Bridging anterior osteophytes. Disc space narrowing with endplate sclerosis and erosion demonstrated at the lumbosacral inter space. This was present on a previous study from 03/10/2016 although there is progression. The previous study demonstrated degenerative changes with Schmorl's nodes period changes today likely also represent degenerative change although discitis is not entirely excluded. Consider MRI for further evaluation if clinically indicated. IMPRESSION: 1. Cholelithiasis with gallbladder wall thickening and pericholecystic edema suggesting cholecystitis. 2. Infiltration in the peripancreatic fat consistent with acute pancreatitis. No loculated collections. 3. Diffuse fatty infiltration of the liver. 4. Small left pleural effusion.  Consolidation in the left lung base may be due to atelectasis or pneumonia. Less prominent atelectasis or infiltration in the right lung base. 5. Large pericardial effusion. 6. Small periumbilical hernias containing fat. Lipoma in the left inguinal canal. 7. Disc space narrowing and erosion at the lumbosacral junction. This was present on the previous study from 2017 and likely represents degenerative change although progression is shown. Consider MRI for further evaluation if clinically indicated. 8. Aortic atherosclerosis with extensive vascular calcifications. Electronically Signed   By: Lucienne Capers [name]   On: 01/10/2018 02:28   Dg Chest Port 1 View  Result Date: 01/11/2018 CLINICAL DATA:  Chest tube, pericardial drain EXAM: PORTABLE CHEST 1 VIEW COMPARISON:  None. FINDINGS: Support devices are stable. Mild cardiomegaly, vascular congestion and bibasilar atelectasis, slightly improved since prior study. IMPRESSION: Improving vascular congestion and bibasilar atelectasis. Electronically Signed   By: Rolm Baptise [name]   On: 01/11/2018 07:31   Dg Chest Port 1 View  Result Date: 01/11/2018 CLINICAL DATA:  Central line placement EXAM: PORTABLE CHEST 1 VIEW COMPARISON:  01/10/2018 FINDINGS: Endotracheal tube with tip measuring 6.5 cm above the carina. Enteric tube tip is off the field of view but below the left hemidiaphragm. Left central venous catheter is in place with tip over the low SVC region. A new right central venous catheter is placed with tip over the mid SVC region. No pneumothorax. Mild cardiac enlargement. No vascular congestion. Atelectasis in the lung bases. Aortic calcification. IMPRESSION: Appliances appear in satisfactory position. Cardiac enlargement. Atelectasis in the lung bases. No pneumothorax. Electronically Signed   By: Lucienne Capers [name]   On: 01/11/2018 03:33   Dg Chest Port 1 View  Result Date: 01/10/2018 CLINICAL  DATA:  Acute hypoxic respiratory failure. Shock.  Pericardial effusion. EXAM: PORTABLE CHEST 1 VIEW COMPARISON:  Chest x-rays dated 01/10/2018 and CT scan of the abdomen dated 01/10/2018 FINDINGS: Endotracheal tube tip is 5.4 cm above the carina. Left central line tip is at the cavoatrial junction, unchanged. NG tube tip is in the fundus of the stomach. There is a tube at the left lung base which may represent a tube in the pericardial space. Cardiac silhouette is prominent but unchanged. Pulmonary vascularity is normal. Persistent consolidation in the left lower lobe. Right lung is clear. IMPRESSION: 1. Persistent left lower lobe pneumonia. 2. Persistent prominence of the cardiac silhouette. There appears to be a pericardial drainage tube in place. 3. Other support apparatus appears in good position. Electronically Signed   By: Lorriane Shire [name]   On: 01/10/2018 11:29   Portable Chest X-ray  Result Date: 01/10/2018 CLINICAL DATA:  67 y/o  M; ET tube placement. EXAM: PORTABLE CHEST 1 VIEW COMPARISON:  01/10/2018 chest radiograph. FINDINGS: Left central venous catheter tip is stable projecting over lower SVC. Endotracheal tube tip projects 5.3 cm above the carina. Enteric tube tip extends below the field of view. Stable cardiomegaly and calcific aortic atherosclerosis. Stable left lower lobe airspace opacity and probable superimposed pulmonary edema. IMPRESSION: 1. Endotracheal tube tip 5.3 cm above carina. 2. Enteric tube tip below field of view in the abdomen. 3. Stable left central venous catheter. 4. Stable left basilar opacity, probable pulmonary edema, cardiomegaly, aortic atherosclerosis. Electronically Signed   By: Kristine Garbe [name]   On: 01/10/2018 06:46   Dg Chest Port 1 View  Result Date: 01/10/2018 CLINICAL DATA:  Status post central line placement EXAM: PORTABLE CHEST 1 VIEW COMPARISON:  01/09/2018 FINDINGS: Left jugular central line is now seen at the cavoatrial junction. No pneumothorax is noted. Cardiac shadow is enlarged but  stable. Left basilar atelectatic changes are again noted and stable. IMPRESSION: No pneumothorax following central line placement. The remainder of the exam is stable. Electronically Signed   By: Inez Catalina [name]   On: 01/10/2018 00:26   Dg Chest Port 1 View  Result Date: 01/09/2018 CLINICAL DATA:  Evaluate pneumonia. EXAM: PORTABLE CHEST 1 VIEW COMPARISON:  01/09/2018. FINDINGS: Stable cardiac enlargement and aortic atherosclerosis. Significantly diminished lung volumes. Mild pulmonary edema suspected. Left lower lobe airspace opacity is unchanged from previous exam. IMPRESSION: 1. No change in aeration to the left lung base compared with previous exam. 2. Suspect mild CHF. Aortic Atherosclerosis (ICD10-I70.0). Electronically Signed   By: Kerby Moors [name]   On: 01/09/2018 23:14   US Abdomen Limited Ruq  Result Date: 01/10/2018 CLINICAL DATA:  Abdominal pain. Abnormal CT scan. CT 01/11/2008 demonstrated cholelithiasis with suggestion of cholecystitis. Pancreatitis. EXAM: ULTRASOUND ABDOMEN LIMITED RIGHT UPPER QUADRANT COMPARISON:  CT abdomen and pelvis 01/10/2018 FINDINGS: Gallbladder: Large stone in the gallbladder measuring 2.2 cm diameter. Diffusely thickened gallbladder wall measuring up to 8 mm. Murphy's sign is negative. Common bile duct: Diameter: 4.9 mm, normal. Liver: No focal lesion identified. Within normal limits in parenchymal echogenicity. Portal vein is patent on color Doppler imaging with normal direction of blood flow towards the liver. IMPRESSION: Cholelithiasis with diffuse gallbladder wall thickening. Negative Murphy's sign. Changes are nonspecific but may indicate acute cholecystitis in the appropriate clinical setting. Electronically Signed   By: Lucienne Capers [name]   On: 01/10/2018 03:35    Scheduled: . chlorhexidine gluconate (MEDLINE KIT)  15 mL Mouth Rinse BID  . Chlorhexidine Gluconate Cloth  6 each Topical Daily  . hydrocortisone sodium succinate  50 mg Intravenous Q6H   . insulin aspart  2-6 Units Subcutaneous Q4H  . mouth rinse  15 mL Mouth Rinse 10 times per day  . pantoprazole  40 mg Intravenous Q12H  . sodium chloride flush  10-40 mL Intracatheter Q12H   Continuous: . sodium chloride    . fentaNYL infusion INTRAVENOUS 400 mcg/hr (01/11/18 0900)  . norepinephrine (LEVOPHED) Adult infusion 38 mcg/min (01/11/18 0900)  . piperacillin-tazobactam Stopped (01/11/18 0631)  . dialysis replacement fluid (prismasate) 500 mL/hr at 01/11/18 0500  . dialysis replacement fluid (prismasate) 300 mL/hr at 01/11/18 0500  . dialysate (PRISMASATE) 2,000 mL/hr at 01/11/18 0722  .  sodium bicarbonate  infusion 1000 mL 75 mL/hr at 01/11/18 0900  . vancomycin    . vasopressin (PITRESSIN) infusion - *FOR SHOCK* 0.03 Units/min (01/11/18 0900)     LOS: 2 days   Estanislado Emms 01/11/2018,9:34 AM

## 2018-01-11 NOTE — Progress Notes (Signed)
Pharmacy Antibiotic Note  [name] is a 67 y.o. male admitted on 01/09/2018 with sepsis.  Pt now transitioning from HD to CRRT.  Plan: Change vancomycin to 1000mg  IV every 24 hours.  Goal trough 15-20 mcg/mL. Zosyn 3.375g IV every 6 hours (30-minute infusion).  Height: 6\' 3"  (190.5 cm) Weight: 245 lb 6 oz (111.3 kg) IBW/kg (Calculated) : 84.5  Temp (24hrs), Avg:97.9 F (36.6 C), Min:97.3 F (36.3 C), Max:98.9 F (37.2 C)  Recent Labs  Lab 01/09/18 1528 01/09/18 1532 01/09/18 2009 01/09/18 2217 01/09/18 2247 01/10/18 0354 01/10/18 0713 01/11/18 0005  WBC  --  7.1  --   --  12.1* 14.4* 15.1*  --   CREATININE  --  6.16*  --   --  7.04* 7.24* 7.42* 8.58*  LATICACIDVEN 5.49*  --  10.29* 10.9*  --  10.9* 12.7*  --     Estimated Creatinine Clearance: 11.7 mL/min (A) (by C-G formula based on SCr of 8.58 mg/dL (H)).    No Known Allergies   Thank you for allowing pharmacy to be a part of this patient's care.  Wynona Neat, PharmD, BCPS  01/11/2018 1:43 AM

## 2018-01-11 NOTE — Consult Note (Addendum)
[facility] Consult: 10:31 AM 01/11/2018  LOS: 2 days    Referring Provider: Dr Jimmey Ralph of [facility].    Primary Care Physician:  Burnard Bunting, MD Primary Gastroenterologist:  Dr. Scarlette Shorts    Reason for Consultation:  Cholelithiasis, pancreatitis.     HPI: [name] is a 67 y.o. male.  Hx ESRD.  Nonischemic cardiomyopathy (EF 20%).  OSA.  Peripheral vascular disease.  Non-Hodgkin's lymphoma 2009.  DM.  Remote (greater than 15 years ago) DVT, on chronic Coumadin.  GERD.  Anemia.  Hypertension.  Skin cancer on the scalp. 06/2008 EGD.  For abdominal, epigastric pain.  Study normal. 08/2015 colonoscopy for history of adenomatous colon polyps in 2006 and 2010.  Dr [name] removed five, 4 - 9 mm polyps.  O/w normal study.  Plan for surveillance colonoscopy 08/2018.    Very complicated patient who was transferred from [facility] 2 days ago.  He presented there saying he had felt poorly since dialysis on 8/10.  He was hypotensive at 80/30.  Chest films showed LLL infiltrate.  Levophed, vancomycin and Zosyn initiated and he was transferred to [facility] ICU.   He was intubated, placed on vent. Lactic acid 12.7.   Complained of severe, 10/10, diffuse abdominal pain and was observed writhing in bed and dry heaving by [facility].  Vasopressin and fentanyl drip added. CT and Echocardiogram revealed pericardial effusion and on 8/12, Dr. Roxy Manns performed pericardial window.  A. fib with heart rate 100-110 noted.    CT abdomen pelvis with contrast showed cholelithiasis, GB wall thickening and pericholecystic edema suggesting cholecystitis.  Peri-pancreatic fat infiltration consistent with acute pancreatitis but no loculated fluid collections.  Diffuse fatty liver.  Small periumbilical, fat-containing hernia.  Left inguinal canal  lipoma.  As visualized the chest showed small left pleural effusion and large pericardial effusion.  Extensive vascular calcifications and aortic atherosclerosis.  Incidental lumbo-sacral disc space narrowing. Abdominal ultrasound reveals 2.2 cm stone in the gallbladder, diffuse gallbladder wall thickening up to 8 mm.  Negative Murphy sign.  4.9 mm CBD.  Liver parenchyma within normal limits.  Patent portal vein with normal directional hepatopetal flow.  Between 8/11 and 8/12, 8/13 the LFTs jumped dramatically. T bili 3.2.  Alkaline phosphatase 178.  AST/ALT 12598/7765.   Lipase 117.   Case discussed with a general surgeon but there is no formal surgery consult in the chart yet.  The surgeon had suggested obtaining GI opinion and felt the patient may require percutaneous cholecystostomy tube as he was not a surgical candidate for cholecystectomy. Other significant lab abnormalities include PT/INR of 83/>10 on 8/11, this has improved to 34/3.4 today.  Patient received total of 8 U FFP, SQ and IV Vit K.    Pt is currently receiving CRRT and remains intubated.  Chest tube in place.   n.p.o. and OG tube in place.      Past Medical History:  Diagnosis Date  . Anemia   . Arthritis    HNP- lumbar, "all over my body"  . Blood transfusion    "years  ago; blood was low" (08/05/2013)  . CKD (chronic kidney disease) stage 4, GFR 15-29 ml/min (HCC) 03/18/2012   [place]- T,TH,Sat.  . Diabetic nephropathy ([place])   . Diabetic retinopathy   . DVT (deep venous thrombosis) ([place])    "got one in my right leg now; I've had one before too, not sure which leg" (08/05/2013)  . ESRD (end stage renal disease) on dialysis [facility])    "just started today, (08/04/2013)"  . Family history of anesthesia complication    " my son wakes up slowly"  . GERD (gastroesophageal reflux disease)    uses alka seltzere on occas.   [name])    "one q now and then" (08/05/2013)  . Hyperlipidemia   . Hypertension     . 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 Non Hodgkin's lymphoma ([place])    Tx 2009; "had chemo; it went away" (08/05/2013)  . Noncompliance 03/16/2012  . NSVT (nonsustained ventricular tachycardia) ([place]) 03/18/2012  . Peripheral vascular disease ([place])   . Pneumonia 2013   hosp.-   . Poor historian    pt. unsure of several answers to health history questions   . Skin cancer    melanoma - head  . Sleep apnea    "suppose to have a sleep study, but they never told me when. (08/05/2013)    Past Surgical History:  Procedure Laterality Date  . ACHILLES TENDON SURGERY Right 03/13/2016   Procedure: ACHILLES LENGTHENING/KIDNER;  Surgeon: Edrick Kins, DPM;  Location: [place];  Service: Podiatry;  Laterality: Right;  . AV FISTULA PLACEMENT Left 02/03/2013   Procedure: ARTERIOVENOUS (AV) FISTULA CREATION- LEFT RADIAL CEPHALIC; ULTRASOUND GUIDED;  Surgeon: Mal Misty, MD;  Location[facility] OR;  Service: Vascular;  Laterality: Left;  . AV FISTULA PLACEMENT Right 11/08/2015   Procedure: RIGHT BRACHIOCEPHALIC ARTERIOVENOUS (AV) FISTULA CREATION;  Surgeon: Serafina Mitchell, MD;  Location: [place];  Service: Vascular;  Laterality: Right;  . BASCILIC VEIN TRANSPOSITION Right 01/24/2016   Procedure: RIGHT SECOND STAGE BASILIC VEIN TRANSPOSITION;  Surgeon: Angelia Mould, MD;  Location: [place];  Service: Vascular;  Laterality: Right;  . CARDIAC CATHETERIZATION    . COLONOSCOPY N/A 09/23/2015   Procedure: COLONOSCOPY;  Surgeon: Irene Shipper, MD;  Location: [facility];  Service: Endoscopy;  Laterality: N/A;  . Coloscopy    . EYE SURGERY Bilateral   . GAS INSERTION  05/10/2012   Procedure: INSERTION OF GAS;  Surgeon: Hayden Pedro, MD;  Location: [place];  Service: Ophthalmology;  Laterality: Right;  . LEFT HEART CATH AND CORONARY ANGIOGRAPHY N/A 09/11/2016   Procedure: Left Heart Cath and Coronary Angiography;  Surgeon: Belva Crome, MD;  Location: [place] CV LAB;  Service: Cardiovascular;  Laterality: N/A;  . LESION EXCISION Right 05/08/2015   Procedure: EXCISION SCALP LESION;  Surgeon: Erroll Luna, MD;  Location: [place];  Service: General;  Laterality: Right;  . MEMBRANE PEEL  05/10/2012   Procedure: MEMBRANE PEEL;  Surgeon: Hayden Pedro, MD;  Location: [place];  Service: Ophthalmology;  Laterality: Right;  . PARS PLANA VITRECTOMY  08/27/2011   Procedure: PARS PLANA VITRECTOMY WITH 25 GAUGE;  Surgeon: Hayden Pedro, MD;  Location: [place];  Service: Ophthalmology;  Laterality: Left;  Repair of complex traction retinal detachment left eye  . PARS PLANA VITRECTOMY  05/10/2012   Procedure: PARS PLANA VITRECTOMY WITH  25 GAUGE;  Surgeon: Hayden Pedro, MD;  Location: [place];  Service: Ophthalmology;  Laterality: Right;  Repair Complex Traction Retinal Detachment  . PHOTOCOAGULATION WITH LASER  05/10/2012   Procedure: PHOTOCOAGULATION WITH LASER;  Surgeon: Hayden Pedro, MD;  Location: [place];  Service: Ophthalmology;  Laterality: Right;  . PORT-A-CATH REMOVAL    . PORTACATH PLACEMENT    . TRANSMETATARSAL AMPUTATION Right 03/13/2016   Procedure: TRANSMETATARSAL AMPUTATION;  Surgeon: Edrick Kins, DPM;  Location: [place];  Service: Podiatry;  Laterality: Right;  . VENA CAVA FILTER PLACEMENT  09/2012   due to preparation for surgery    Prior to Admission medications   Medication Sig Start Date End Date Taking? Authorizing Provider  acetaminophen (TYLENOL) 325 MG tablet Take 650 mg by mouth every 6 (six) hours as needed (pain).   Yes [provider]  ergocalciferol (VITAMIN D2) 50000 units capsule Take 50,000 Units by mouth once a week. Monday   Yes [provider]  insulin aspart (NOVOLOG) 100 UNIT/ML injection Inject 10 Units into the skin 3 (three) times daily before meals.    Yes [provider]  insulin glargine (LANTUS) 100 UNIT/ML injection Inject 0.15 mLs (15 Units total) into the  skin at bedtime. 03/17/16  Yes Theodis Blaze, MD  isosorbide-hydrALAZINE (BIDIL) 20-37.5 MG tablet Take 1 tablet by mouth 3 (three) times daily. 10/14/16  Yes Camnitz, Will Hassell Done, MD  lidocaine-prilocaine (EMLA) cream Apply 1 application topically as needed (Apply small amount to access site 1-2 hours before dialysis. Cover with occlusive dressing (saran wrap)).    Yes [provider]  metoprolol succinate (TOPROL-XL) 25 MG 24 hr tablet Take 1 tablet (25 mg total) by mouth daily. Take with or immediately following a meal. 09/09/16  Yes Camnitz, Ocie Doyne, MD  multivitamin (RENA-VIT) TABS tablet Take 1 tablet by mouth at bedtime. 08/08/13  Yes Burnard Bunting, MD  polysaccharide iron (NIFEREX) 150 MG CAPS capsule Take 1 capsule (150 mg total) by mouth daily. 01/16/11  Yes Isaac Bliss, Rayford Halsted, MD  sevelamer carbonate (RENVELA) 800 MG tablet Take 1,600 mg by mouth 3 (three) times daily with meals.   Yes [provider]  warfarin (COUMADIN) 5 MG tablet Take 7.5-10 mg by mouth daily. Take 7.5 mg on Sun / Tues / Wed / Fri / Sat  Take 10 mg on Mon / Thurs   Yes [provider]    Scheduled Meds: . chlorhexidine gluconate (MEDLINE KIT)  15 mL Mouth Rinse BID  . Chlorhexidine Gluconate Cloth  6 each Topical Daily  . hydrocortisone sodium succinate  50 mg Intravenous Q6H  . insulin aspart  2-6 Units Subcutaneous Q4H  . mouth rinse  15 mL Mouth Rinse 10 times per day  . pantoprazole  40 mg Intravenous Q12H  . sodium chloride flush  10-40 mL Intracatheter Q12H   Infusions: . sodium chloride    . fentaNYL infusion INTRAVENOUS 350 mcg/hr (01/11/18 1000)  . norepinephrine (LEVOPHED) Adult infusion 30 mcg/min (01/11/18 1000)  . piperacillin-tazobactam Stopped (01/11/18 0631)  . dialysis replacement fluid (prismasate) 500 mL/hr at 01/11/18 0500  . dialysis replacement fluid (prismasate) 300 mL/hr at 01/11/18 0500  . dialysate (PRISMASATE) 2,000 mL/hr at 01/11/18 0957  .  vancomycin    . vasopressin (PITRESSIN) infusion - *FOR SHOCK* 0.03 Units/min (01/11/18 1000)   PRN Meds: sodium chloride, heparin, midazolam, ondansetron (ZOFRAN) IV, sodium chloride flush   Allergies as of 01/09/2018  . (No Known Allergies)  Family History  Problem Relation Age of Onset  . Anesthesia problems Son   . Hypertension Son   . Diabetes Father   . Hypertension Father   . Other Father        amputation  . Colon cancer Neg Hx     Social History   Socioeconomic History  . Marital status: Single    Spouse name: Not on file  . Number of children: 1  . Years of education: Not on file  . Highest education level: Not on file  Occupational History  . 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 Financial resource strain: Not on file  . Food insecurity:    Worry: Not on file    Inability: Not on file  . Transportation needs:    Medical: Not on file    Non-medical: Not on file  Tobacco Use  . Smoking status: Never Smoker  . Smokeless tobacco: Never Used  Substance and Sexual Activity  . Alcohol use: No    Alcohol/week: 0.0 standard drinks  . Drug use: No  . Sexual activity: Not Currently  Lifestyle  . Physical activity:    Days per week: Not on file    Minutes per session: Not on file  . Stress: Not on file  Relationships  . Social connections:    Talks on phone: Not on file    Gets together: Not on file    Attends religious service: Not on file    Active member of club or organization: Not on file    Attends meetings of clubs or organizations: Not on file    Relationship status: Not on file  . Intimate partner violence:    Fear of current or ex partner: Not on file    Emotionally abused: Not on file    Physically abused: Not on file    Forced sexual activity: Not on file  Other Topics Concern  . Not on file  Social History Narrative  . Not on file    REVIEW OF SYSTEMS: Unable to obtain ROS in intubated pt   PHYSICAL  EXAM: Vital signs in last 24 hours: Vitals:   01/11/18 0900 01/11/18 1000  BP: 126/72 131/84  Pulse: (!) 110 (!) 109  Resp: (!) 28 (!) 28  Temp:    SpO2: 100% 100%   Wt Readings from Last 3 Encounters:  01/11/18 114.4 kg  10/14/16 99.6 kg  09/11/16 97.5 kg    General: looks critically ill.  Eyes open, nods appropriately Head:  No signs of trauma.  ETT/vent/OG tube in place in place.    Eyes: No scleral icterus.  No conjunctival pallor. Ears: Seems to be able to hear me as well as his family. Nose: No discharge or congestion. Mouth: ETT in place.  OG tube in place which is suctioning green opaque liquid.  There is about 75 cc in the suction canister that has accumulated since sometime yesterday. Neck:   Lungs: Breathing quietly on vent.  Lungs clear from anterior auscultation. Heart: RR, rate 108. External cardioversion pads in place Abdomen:  Sternal scar extends to upper midline.  NT.  BS quiet.  No tinkling or tympanitic BS.   Rectal: deferred   Musc/Skeltl: right forfoot amputated.   Extremities:  Right forefoot amputated  Neurologic:  Nods appropriately, intermittently follows commands Skin:  Dermatitis on left LE/foot Tattoos:  None seen   Psych:  Calm on vent.    Intake/Output from previous  day: 08/12 0701 - 08/13 0700 In: 4880.8 [I.V.:2891; Blood:1628.7; IV Piggyback:361.1] Out: 1962 [Blood:1100; Chest Tube:133] Intake/Output this shift: Total I/O In: 460.4 [I.V.:460.4] Out: 695 [Other:695]  LAB RESULTS: Recent Labs    01/10/18 0713 01/10/18 1011 01/11/18 0412 01/11/18 0815  WBC 15.1*  --  16.8* 16.8*  HGB 12.3* 9.5* 9.6* 9.7*  HCT 40.5 28.0* 29.3* 29.8*  PLT 259  --  235 234   BMET Lab Results  Component Value Date   NA 135 01/11/2018   NA 136 01/11/2018   NA 136 01/11/2018   K 5.4 (H) 01/11/2018   K 5.4 (H) 01/11/2018   K 6.0 (H) 01/11/2018   CL 92 (L) 01/11/2018   CL 93 (L) 01/11/2018   CL 91 (L) 01/11/2018   CO2 22 01/11/2018   CO2 22  01/11/2018   CO2 18 (L) 01/11/2018   GLUCOSE 186 (H) 01/11/2018   GLUCOSE 186 (H) 01/11/2018   GLUCOSE 189 (H) 01/11/2018   BUN 36 (H) 01/11/2018   BUN 34 (H) 01/11/2018   BUN 40 (H) 01/11/2018   CREATININE 6.63 (H) 01/11/2018   CREATININE 6.65 (H) 01/11/2018   CREATININE 8.00 (H) 01/11/2018   CALCIUM 7.9 (L) 01/11/2018   CALCIUM 7.8 (L) 01/11/2018   CALCIUM 8.0 (L) 01/11/2018   LFT Recent Labs    01/09/18 2247 01/11/18 0005 01/11/18 0412 01/11/18 0815  PROT 7.3  --  7.1 7.3  ALBUMIN 2.7* 3.1* 3.1* 3.1*  3.2*  AST 434*  --  12,598* PENDING  ALT 418*  --  7,765* 7,786*  ALKPHOS 157*  --  162* 178*  BILITOT 2.0*  --  3.0* 3.2*   PT/INR Lab Results  Component Value Date   INR 3.41 01/11/2018   INR 4.28 (HH) 01/11/2018   INR 3.91 01/10/2018   Hepatitis Panel No results for input(s): HEPBSAG, HCVAB, HEPAIGM, HEPBIGM in the last 72 hours. C-Diff No components found for: CDIFF Lipase     Component Value Date/Time   LIPASE 117 (H) 01/11/2018 0412    Drugs of Abuse  No results found for: LABOPIA, COCAINSCRNUR, LABBENZ, AMPHETMU, THCU, LABBARB   RADIOLOGY STUDIES: Dg Chest 2 View  Result Date: 01/09/2018 CLINICAL DATA:  Dialysis treatment today. Patient not feeling well since that time. EXAM: CHEST - 2 VIEW COMPARISON:  March 05, 2016 FINDINGS: Stable cardiomegaly. Increased opacity in left retrocardiac region may represent atelectasis or infiltrate. This is best seen on the lateral view. The hila and mediastinum are normal. No pneumothorax. No pulmonary nodules or masses. IMPRESSION: Left retrocardiac opacity could represent atelectasis or infiltrate. Recommend clinical correlation and follow-up to resolution. Electronically Signed   By: Dorise Bullion III M.D   On: 01/09/2018 16:08   Dg Abd 1 View  Result Date: 01/10/2018 CLINICAL DATA:  Orogastric tube placement EXAM: ABDOMEN - 1 VIEW COMPARISON:  CT from earlier the same day FINDINGS: Enteric tube has been  advanced into the stomach which is nondilated. Visualized bowel gas pattern normal. IVC filter at the L2 level. IMPRESSION: Enteric tube to the gastric fundus.  Normal bowel gas pattern. Electronically Signed   By: Lucrezia Europe [name]   On: 01/10/2018 08:00   Dg Abd 1 View  Result Date: 01/09/2018 CLINICAL DATA:  Abdominal pain EXAM: ABDOMEN - 1 VIEW COMPARISON:  None. FINDINGS: Scattered large and small bowel gas is noted. IVC filter is noted in place. Diffuse vascular calcifications are seen. No acute bony abnormality is noted. IMPRESSION: No acute abnormality seen. Electronically  Signed   By: Inez Catalina [name]   On: 01/09/2018 23:13   Ct Abdomen Pelvis W Contrast  Result Date: 01/10/2018 CLINICAL DATA:  Abdominal pain, sepsis, hypotension. Severe acute abdominal pain and shock. History of end-stage renal disease on hemodialysis. EXAM: CT ABDOMEN AND PELVIS WITH CONTRAST TECHNIQUE: Multidetector CT imaging of the abdomen and pelvis was performed using the standard protocol following bolus administration of intravenous contrast. CONTRAST:  143m OMNIPAQUE IOHEXOL 300 MG/ML  SOLN COMPARISON:  None. FINDINGS: Lower chest: Small left pleural effusion. Consolidation in the left lung base may be due to atelectasis or pneumonia. Less prominent atelectasis or infiltration in the right lung base. Large pericardial effusion. Heart size is normal. Hepatobiliary: Diffuse fatty infiltration of the liver. There is a large stone in the gallbladder. Gallbladder wall is thickened and there is edema around the gallbladder suggesting cholecystitis. No bile duct dilatation. Pancreas: Hazy outline of the pancreas particularly around the head with infiltration in the peripancreatic fat. Changes are consistent with acute pancreatitis. No loculated collections identified. Spleen: Normal in size without focal abnormality. Adrenals/Urinary Tract: Adrenal glands are unremarkable. Kidneys are normal, without renal calculi, focal lesion,  or hydronephrosis. Bladder is decompressed. Stomach/Bowel: Stomach, small bowel, and colon are not abnormally distended. No wall thickening is identified although under distention limits evaluation of bowel wall. No inflammatory changes suggested. Appendix is normal. Vascular/Lymphatic: Aortic atherosclerosis. Prominent vascular calcifications throughout the abdomen and pelvis. No enlarged abdominal or pelvic lymph nodes. Inferior vena caval filter present. Reproductive: Prostate is unremarkable. Other: No free air or free fluid in the abdomen. Small periumbilical hernias containing fat. Lipoma in the left inguinal canal. Musculoskeletal: Degenerative changes in the spine. Bridging anterior osteophytes. Disc space narrowing with endplate sclerosis and erosion demonstrated at the lumbosacral inter space. This was present on a previous study from 03/10/2016 although there is progression. The previous study demonstrated degenerative changes with Schmorl's nodes period changes today likely also represent degenerative change although discitis is not entirely excluded. Consider MRI for further evaluation if clinically indicated. IMPRESSION: 1. Cholelithiasis with gallbladder wall thickening and pericholecystic edema suggesting cholecystitis. 2. Infiltration in the peripancreatic fat consistent with acute pancreatitis. No loculated collections. 3. Diffuse fatty infiltration of the liver. 4. Small left pleural effusion. Consolidation in the left lung base may be due to atelectasis or pneumonia. Less prominent atelectasis or infiltration in the right lung base. 5. Large pericardial effusion. 6. Small periumbilical hernias containing fat. Lipoma in the left inguinal canal. 7. Disc space narrowing and erosion at the lumbosacral junction. This was present on the previous study from 2017 and likely represents degenerative change although progression is shown. Consider MRI for further evaluation if clinically indicated. 8. Aortic  atherosclerosis with extensive vascular calcifications. Electronically Signed   By: WLucienne CapersM.D.   On: 01/10/2018 02:28   Dg Chest Port 1 View  Result Date: 01/11/2018 CLINICAL DATA:  Chest tube, pericardial drain EXAM: PORTABLE CHEST 1 VIEW COMPARISON:  None. FINDINGS: Support devices are stable. Mild cardiomegaly, vascular congestion and bibasilar atelectasis, slightly improved since prior study. IMPRESSION: Improving vascular congestion and bibasilar atelectasis. Electronically Signed   By: KRolm BaptiseM.D.   On: 01/11/2018 07:31   Dg Chest Port 1 View  Result Date: 01/11/2018 CLINICAL DATA:  Central line placement EXAM: PORTABLE CHEST 1 VIEW COMPARISON:  01/10/2018 FINDINGS: Endotracheal tube with tip measuring 6.5 cm above the carina. Enteric tube tip is off the field of view but below the left hemidiaphragm. Left  central venous catheter is in place with tip over the low SVC region. A new right central venous catheter is placed with tip over the mid SVC region. No pneumothorax. Mild cardiac enlargement. No vascular congestion. Atelectasis in the lung bases. Aortic calcification. IMPRESSION: Appliances appear in satisfactory position. Cardiac enlargement. Atelectasis in the lung bases. No pneumothorax. Electronically Signed   By: Lucienne Capers [name]   On: 01/11/2018 03:33   Dg Chest Port 1 View  Result Date: 01/10/2018 CLINICAL DATA:  Acute hypoxic respiratory failure. Shock. Pericardial effusion. EXAM: PORTABLE CHEST 1 VIEW COMPARISON:  Chest x-rays dated 01/10/2018 and CT scan of the abdomen dated 01/10/2018 FINDINGS: Endotracheal tube tip is 5.4 cm above the carina. Left central line tip is at the cavoatrial junction, unchanged. NG tube tip is in the fundus of the stomach. There is a tube at the left lung base which may represent a tube in the pericardial space. Cardiac silhouette is prominent but unchanged. Pulmonary vascularity is normal. Persistent consolidation in the left lower  lobe. Right lung is clear. IMPRESSION: 1. Persistent left lower lobe pneumonia. 2. Persistent prominence of the cardiac silhouette. There appears to be a pericardial drainage tube in place. 3. Other support apparatus appears in good position. Electronically Signed   By: Lorriane Shire [name]   On: 01/10/2018 11:29   Portable Chest X-ray  Result Date: 01/10/2018 CLINICAL DATA:  67 y/o  M; ET tube placement. EXAM: PORTABLE CHEST 1 VIEW COMPARISON:  01/10/2018 chest radiograph. FINDINGS: Left central venous catheter tip is stable projecting over lower SVC. Endotracheal tube tip projects 5.3 cm above the carina. Enteric tube tip extends below the field of view. Stable cardiomegaly and calcific aortic atherosclerosis. Stable left lower lobe airspace opacity and probable superimposed pulmonary edema. IMPRESSION: 1. Endotracheal tube tip 5.3 cm above carina. 2. Enteric tube tip below field of view in the abdomen. 3. Stable left central venous catheter. 4. Stable left basilar opacity, probable pulmonary edema, cardiomegaly, aortic atherosclerosis. Electronically Signed   By: Kristine Garbe [name]   On: 01/10/2018 06:46   Dg Chest Port 1 View  Result Date: 01/10/2018 CLINICAL DATA:  Status post central line placement EXAM: PORTABLE CHEST 1 VIEW COMPARISON:  01/09/2018 FINDINGS: Left jugular central line is now seen at the cavoatrial junction. No pneumothorax is noted. Cardiac shadow is enlarged but stable. Left basilar atelectatic changes are again noted and stable. IMPRESSION: No pneumothorax following central line placement. The remainder of the exam is stable. Electronically Signed   By: Inez Catalina [name]   On: 01/10/2018 00:26   Dg Chest Port 1 View  Result Date: 01/09/2018 CLINICAL DATA:  Evaluate pneumonia. EXAM: PORTABLE CHEST 1 VIEW COMPARISON:  01/09/2018. FINDINGS: Stable cardiac enlargement and aortic atherosclerosis. Significantly diminished lung volumes. Mild pulmonary edema suspected. Left  lower lobe airspace opacity is unchanged from previous exam. IMPRESSION: 1. No change in aeration to the left lung base compared with previous exam. 2. Suspect mild CHF. Aortic Atherosclerosis (ICD10-I70.0). Electronically Signed   By: Kerby Moors [name]   On: 01/09/2018 23:14   US Abdomen Limited Ruq  Result Date: 01/10/2018 CLINICAL DATA:  Abdominal pain. Abnormal CT scan. CT 01/11/2008 demonstrated cholelithiasis with suggestion of cholecystitis. Pancreatitis. EXAM: ULTRASOUND ABDOMEN LIMITED RIGHT UPPER QUADRANT COMPARISON:  CT abdomen and pelvis 01/10/2018 FINDINGS: Gallbladder: Large stone in the gallbladder measuring 2.2 cm diameter. Diffusely thickened gallbladder wall measuring up to 8 mm. Murphy's sign is negative. Common bile duct: Diameter: 4.9 mm, normal. Liver:  No focal lesion identified. Within normal limits in parenchymal echogenicity. Portal vein is patent on color Doppler imaging with normal direction of blood flow towards the liver. IMPRESSION: Cholelithiasis with diffuse gallbladder wall thickening. Negative Murphy's sign. Changes are nonspecific but may indicate acute cholecystitis in the appropriate clinical setting. Electronically Signed   By: Lucienne Capers [name]   On: 01/10/2018 03:35      IMPRESSION:   *  Sepsis with shock.  Lactic acidosis. On pressors: Levophed, vasopressin..   On vancomycin/Zosyn.  *   Cholelithiasis, ? Cholecystitis.    Marked transaminitis suggest shock liver, not sure how much of LFT elevation is due to cholelithiasis.   CBD normal diameter on [place] and CT.    *   Pancreatitis.  ? Biliary vs ischemic?  *  Pericardial effusion, s/p pericardial window 8/12.    *   ESRD.  HD pt.  Currently on CVVH  *   Hyperkalemia.    *   Non-ischemic CM.  EF 20% at TEE on 8/12.   Trop I 0.11 .. 0.33 .Marland Kitchen 2.35 in last 3 days.    *    Coagulopathy.  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  Current marked coag elevation due to shock liver.  S/p FFP and Vit K:  improved but still supratherapeutic coags.    *  Normocytic anemia.    PLAN:     *  D/w Dr Fuller Plan.  Pt needs formal surgical eval and if pt felt to have acute cholecystitis will liklely need percutaneous cholecystostomy tube.   Too sick for MRCP currently but once pt recovers, this can be performed.   Keep NPO for now.  ? Trial trickle tube feeds?  If po not started back soon (2 days) then will need TNA but prefer to avoid this given his liver injury. Trend LFTs and lipase.       Azucena Freed  01/11/2018, 10:31 AM Phone [phone]     Attending physician's note   I have taken a history, examined the patient and reviewed the chart. I agree with the Advanced Practitioner's note, impression and recommendations. Sepsis with shock. Intubated/vent. Pericardial effusion with tamponade. Pericardial window yesterday.   Acute hepatic insult due to shock liver or acute congestive hepatopathy. Mild ischemic pancreatitis. R/O mild biliary pancreatitis. Cholelithiasis, R/O cholecystitis, ? ischemic.  Surgical consult, consider perc cholecystostomy.  Trend LFTs, lipase, CBC, BMET.  MRCP when clinically improved Consider starting TFs, defer decision to primary service.  Lucio Edward, MD FACG [phone] office

## 2018-01-11 NOTE — Progress Notes (Signed)
DAILY PROGRESS NOTE   Patient Name: [name] Date of Encounter: 01/11/2018  Chief Complaint   Intubated, sedated on vent  Patient Profile   67 yo male with shock, found to have a uremic pericardial effusion and tamponade, s/p pericardial window, now with ongoing hypotension and known systolic CHF - EF 69%.  Subjective   On CRRT - CXR today shows improving vascular congestion. Labs markedly abnormal today. Appears to have shock liver - enzymes in the 7-12K range, troponin elevated at 2.35. WBC 17K. Requiring levo/vaso - attempt to wean today as volume comes off - set to UF 50cc/hr.  Objective   Vitals:   01/11/18 0454 01/11/18 0500 01/11/18 0600 01/11/18 0700  BP:  116/71 113/74 128/79  Pulse:  (!) 101 (!) 110 (!) 110  Resp:  (!) 28 (!) 28 (!) 28  Temp:      TempSrc:      SpO2: 100% 100% 100% 100%  Weight:  114.4 kg    Height:        Intake/Output Summary (Last 24 hours) at 01/11/2018 0817 Last data filed at 01/11/2018 0800 Gross per 24 hour  Intake 4645.75 ml  Output 1959 ml  Net 2686.75 ml   Filed Weights   01/09/18 2105 01/10/18 0418 01/11/18 0500  Weight: 111.3 kg 111.3 kg 114.4 kg    Physical Exam   General appearance: intubated, sedated on vent Neck: no carotid bruit, no JVD, thyroid not enlarged, symmetric, no tenderness/mass/nodules and sclerae icteric, pupils dilated, minimally responsive Lungs: diminished breath sounds bibasilar Heart: irregularly irregular rhythm Abdomen: soft, non-tender; bowel sounds normal; no masses,  no organomegaly Extremities: edema 1+ edema, right TMA Pulses: weak distal pulses Skin: Skin color, texture, turgor normal. No rashes or lesions Neurologic: Mental status: intubated, sedated on vent Psych: Cannot assess  Inpatient Medications    Scheduled Meds: . chlorhexidine gluconate (MEDLINE KIT)  15 mL Mouth Rinse BID  . Chlorhexidine Gluconate Cloth  6 each Topical Daily  . hydrocortisone sodium succinate  50 mg  Intravenous Q6H  . insulin aspart  2-6 Units Subcutaneous Q4H  . mouth rinse  15 mL Mouth Rinse 10 times per day  . pantoprazole  40 mg Intravenous Q12H  . sodium chloride flush  10-40 mL Intracatheter Q12H    Continuous Infusions: . sodium chloride    . fentaNYL infusion INTRAVENOUS 400 mcg/hr (01/11/18 0744)  . norepinephrine (LEVOPHED) Adult infusion 43 mcg/min (01/11/18 0700)  . piperacillin-tazobactam Stopped (01/11/18 0631)  . dialysis replacement fluid (prismasate) 500 mL/hr at 01/11/18 0500  . dialysis replacement fluid (prismasate) 300 mL/hr at 01/11/18 0500  . dialysate (PRISMASATE) 2,000 mL/hr at 01/11/18 0722  .  sodium bicarbonate  infusion 1000 mL 75 mL/hr at 01/11/18 0700  . vancomycin    . vasopressin (PITRESSIN) infusion - *FOR SHOCK* 0.03 Units/min (01/11/18 0700)    PRN Meds: sodium chloride, heparin, midazolam, ondansetron (ZOFRAN) IV, sodium chloride flush   Labs   Results for orders placed or performed during the hospital encounter of 01/09/18 (from the past 48 hour(s))  I-Stat CG4 Lactic Acid, ED     Status: Abnormal   Collection Time: 01/09/18  3:28 PM  Result Value Ref Range   Lactic Acid, Venous 5.49 (HH) 0.5 - 1.9 mmol/L   Comment NOTIFIED PHYSICIAN   I-stat troponin, ED     Status: None   Collection Time: 01/09/18  3:30 PM  Result Value Ref Range   Troponin i, poc 0.08 0.00 - 0.08  ng/mL   Comment 3            Comment: Due to the release kinetics of cTnI, a negative result within the first hours of the onset of symptoms does not rule out myocardial infarction with certainty. If myocardial infarction is still suspected, repeat the test at appropriate intervals.   Comprehensive metabolic panel     Status: Abnormal   Collection Time: 01/09/18  3:32 PM  Result Value Ref Range   Sodium 138 135 - 145 mmol/L   Potassium 4.8 3.5 - 5.1 mmol/L   Chloride 96 (L) 98 - 111 mmol/L   CO2 25 22 - 32 mmol/L   Glucose, Bld 238 (H) 70 - 99 mg/dL   BUN 25 (H)  8 - 23 mg/dL   Creatinine, Ser 6.16 (H) 0.61 - 1.24 mg/dL   Calcium 8.8 (L) 8.9 - 10.3 mg/dL   Total Protein 8.0 6.5 - 8.1 g/dL   Albumin 3.2 (L) 3.5 - 5.0 g/dL   AST 109 (H) 15 - 41 U/L   ALT 93 (H) 0 - 44 U/L   Alkaline Phosphatase 158 (H) 38 - 126 U/L   Total Bilirubin 1.6 (H) 0.3 - 1.2 mg/dL   GFR calc non Af Amer 9 (L) >60 mL/min   GFR calc Af Amer 10 (L) >60 mL/min    Comment: (NOTE) The eGFR has been calculated using the CKD EPI equation. This calculation has not been validated in all clinical situations. eGFR's persistently <60 mL/min signify possible Chronic Kidney Disease.    Anion gap 17 (H) 5 - 15    Comment: Performed at [facility], [address], [place], [place] [address]  Lipase, blood     Status: None   Collection Time: 01/09/18  3:32 PM  Result Value Ref Range   Lipase 25 11 - 51 U/L    Comment: Performed at [facility], [address], [place], [place] [address]  Brain natriuretic peptide     Status: Abnormal   Collection Time: 01/09/18  3:32 PM  Result Value Ref Range   B Natriuretic Peptide 276.0 (H) 0.0 - 100.0 pg/mL    Comment: Performed at [facility], [address], [place], [place] [address]  CBC with Differential     Status: Abnormal   Collection Time: 01/09/18  3:32 PM  Result Value Ref Range   WBC 7.1 4.0 - 10.5 K/uL   RBC 3.61 (L) 4.22 - 5.81 MIL/uL   Hemoglobin 10.8 (L) 13.0 - 17.0 g/dL   HCT 34.0 (L) 39.0 - 52.0 %   MCV 94.2 78.0 - 100.0 fL   MCH 29.9 26.0 - 34.0 pg   MCHC 31.8 30.0 - 36.0 g/dL   RDW 16.5 (H) 11.5 - 15.5 %   Platelets 207 150 - 400 K/uL   Neutrophils Relative % 75 %   Neutro Abs 5.3 1.7 - 7.7 K/uL   Lymphocytes Relative 17 %   Lymphs Abs 1.2 0.7 - 4.0 K/uL   Monocytes Relative 8 %   Monocytes Absolute 0.6 0.1 - 1.0 K/uL   Eosinophils Relative 0 %   Eosinophils Absolute 0.0 0.0 - 0.7 K/uL   Basophils Relative 0 %   Basophils Absolute 0.0 0.0 - 0.1 K/uL    Comment: Performed at [facility], [address]  [place],  [address]  Protime-INR     Status: Abnormal   Collection Time: 01/09/18  3:32 PM  Result Value Ref Range   Prothrombin Time 57.3 (H)  11.4 - 15.2 seconds   INR 6.62 (HH)     Comment: REPEATED TO VERIFY CRITICAL RESULT CALLED TO, READ BACK BY AND VERIFIED WITH: MANDY @ 0175 ON [id number] BY HENDERSON L. Performed at [facility], [address] [place], [place] [address]   Procalcitonin - Baseline     Status: None   Collection Time: 01/09/18  3:32 PM  Result Value Ref Range   Procalcitonin 1.44 ng/mL    Comment:        Interpretation: PCT > 0.5 ng/mL and <= 2 ng/mL: Systemic infection (sepsis) is possible, but other conditions are known to elevate PCT as well. (NOTE)       Sepsis PCT Algorithm           Lower Respiratory Tract                                      Infection PCT Algorithm    ----------------------------     ----------------------------         PCT < 0.25 ng/mL                PCT < 0.10 ng/mL         Strongly encourage             Strongly discourage   discontinuation of antibiotics    initiation of antibiotics    ----------------------------     -----------------------------       PCT 0.25 - 0.50 ng/mL            PCT 0.10 - 0.25 ng/mL               OR       >80% decrease in PCT            Discourage initiation of                                            antibiotics      Encourage discontinuation           of antibiotics    ----------------------------     -----------------------------         PCT >= 0.50 ng/mL              PCT 0.26 - 0.50 ng/mL                AND       <80% decrease in PCT             Encourage initiation of                                             antibiotics       Encourage continuation           of antibiotics    ----------------------------     -----------------------------        PCT >= 0.50 ng/mL                  PCT > 0.50 ng/mL               AND         increase in PCT  Strongly encourage                                       initiation of antibiotics    Strongly encourage escalation           of antibiotics                                     -----------------------------                                           PCT <= 0.25 ng/mL                                                 OR                                        > 80% decrease in PCT                                     Discontinue / Do not initiate                                             antibiotics Performed at [facility], [address] [place], [place] [address]   Blood Culture (routine x 2)     Status: None (Preliminary result)   Collection Time: 01/09/18  4:16 PM  Result Value Ref Range   Specimen Description      LEFT ANTECUBITAL BOTTLES DRAWN AEROBIC AND ANAEROBIC   Special Requests Blood Culture adequate volume    Culture      NO GROWTH < 24 HOURS Performed at [facility], [address] [place], [place] [address]    Report Status PENDING   CBG monitoring, ED     Status: Abnormal   Collection Time: 01/09/18  5:19 PM  Result Value Ref Range   Glucose-Capillary 200 (H) 70 - 99 mg/dL  I-Stat CG4 Lactic Acid, ED     Status: Abnormal   Collection Time: 01/09/18  8:09 PM  Result Value Ref Range   Lactic Acid, Venous 10.29 (HH) 0.5 - 1.9 mmol/L   Comment NOTIFIED PHYSICIAN   Glucose, capillary     Status: Abnormal   Collection Time: 01/09/18  9:06 PM  Result Value Ref Range   Glucose-Capillary 190 (H) 70 - 99 mg/dL   Comment 1 Notify RN   MRSA PCR Screening     Status: None   Collection Time: 01/09/18  9:21 PM  Result Value Ref Range   MRSA by PCR NEGATIVE NEGATIVE    Comment:        The GeneXpert MRSA Assay (FDA approved for NASAL specimens only), is one component of a comprehensive MRSA colonization surveillance program. It is not intended to diagnose MRSA infection nor  to guide or monitor treatment for MRSA infections. Performed at [facility], [place] [address], [place], [place] [address]     Lactic acid, plasma     Status: Abnormal   Collection Time: 01/09/18 10:17 PM  Result Value Ref Range   Lactic Acid, Venous 10.9 (HH) 0.5 - 1.9 mmol/L    Comment: CRITICAL RESULT CALLED TO, READ BACK BY AND VERIFIED WITH: ELIAS,M RN 01/10/2018 0034 JORDANS Performed at [facility], [place] [address] [place], [place] [address]   Troponin I (q 6hr x 3)     Status: Abnormal   Collection Time: 01/09/18 10:17 PM  Result Value Ref Range   Troponin I 0.11 (HH) <0.03 ng/mL    Comment: CRITICAL RESULT CALLED TO, READ BACK BY AND VERIFIED WITH: ELIAS,M RN 01/10/2018 0045 JORDANS Performed at [facility], [place] [address] [place], [place] [address]   Protime-INR     Status: Abnormal   Collection Time: 01/09/18 10:17 PM  Result Value Ref Range   Prothrombin Time 83.2 (H) 11.4 - 15.2 seconds    Comment: REPEATED TO VERIFY   INR >10.00 (HH)     Comment: REPEATED TO VERIFY CRITICAL RESULT CALLED TO, READ BACK BY AND VERIFIED WITH: C.HAMMONDS,MD 0122 01/10/18 G.MCADOO Performed at [facility], [place] [address], [place], [place] [address]   Comprehensive metabolic panel     Status: Abnormal   Collection Time: 01/09/18 10:47 PM  Result Value Ref Range   Sodium 129 (L) 135 - 145 mmol/L   Potassium 5.5 (H) 3.5 - 5.1 mmol/L   Chloride 86 (L) 98 - 111 mmol/L   CO2 17 (L) 22 - 32 mmol/L   Glucose, Bld 487 (H) 70 - 99 mg/dL   BUN 28 (H) 8 - 23 mg/dL   Creatinine, Ser 7.04 (H) 0.61 - 1.24 mg/dL   Calcium 8.7 (L) 8.9 - 10.3 mg/dL   Total Protein 7.3 6.5 - 8.1 g/dL   Albumin 2.7 (L) 3.5 - 5.0 g/dL   AST 434 (H) 15 - 41 U/L   ALT 418 (H) 0 - 44 U/L   Alkaline Phosphatase 157 (H) 38 - 126 U/L   Total Bilirubin 2.0 (H) 0.3 - 1.2 mg/dL   GFR calc non Af Amer 7 (L) >60 mL/min   GFR calc Af Amer 8 (L) >60 mL/min    Comment: (NOTE) The eGFR has been calculated using the CKD EPI equation. This calculation has not been validated in all clinical situations. eGFR's persistently <60 mL/min  signify possible Chronic Kidney Disease.    Anion gap 26 (H) 5 - 15    Comment: RESULT CHECKED Performed at [facility], [place] [address] [place], [place] [address]   CBC with Differential/Platelet     Status: Abnormal   Collection Time: 01/09/18 10:47 PM  Result Value Ref Range   WBC 12.1 (H) 4.0 - 10.5 K/uL   RBC 3.43 (L) 4.22 - 5.81 MIL/uL   Hemoglobin 10.1 (L) 13.0 - 17.0 g/dL   HCT 34.0 (L) 39.0 - 52.0 %   MCV 99.1 78.0 - 100.0 fL   MCH 29.4 26.0 - 34.0 pg   MCHC 29.7 (L) 30.0 - 36.0 g/dL   RDW 16.4 (H) 11.5 - 15.5 %   Platelets 219 150 - 400 K/uL   Neutrophils Relative % 85 %   Neutro Abs 10.3 (H) 1.7 - 7.7 K/uL   Lymphocytes Relative 8 %   Lymphs Abs 0.9  0.7 - 4.0 K/uL   Monocytes Relative 6 %   Monocytes Absolute 0.7 0.1 - 1.0 K/uL   Eosinophils Relative 0 %   Eosinophils Absolute 0.0 0.0 - 0.7 K/uL   Basophils Relative 0 %   Basophils Absolute 0.0 0.0 - 0.1 K/uL   Immature Granulocytes 1 %   Abs Immature Granulocytes 0.1 0.0 - 0.1 K/uL    Comment: Performed at [facility] [address] [place], [place] [address]  Cortisol     Status: None   Collection Time: 01/09/18 11:21 PM  Result Value Ref Range   Cortisol, Plasma 39.5 ug/dL    Comment: (NOTE) AM    6.7 - 22.6 ug/dL PM   <10.0       ug/dL Performed at [place] [address] [place], [place] [address]   I-STAT 3, arterial blood gas (G3+)     Status: Abnormal   Collection Time: 01/09/18 11:30 PM  Result Value Ref Range   pH, Arterial 7.208 (L) 7.350 - 7.450   pCO2 arterial 42.4 32.0 - 48.0 mmHg   pO2, Arterial 19.0 (LL) 83.0 - 108.0 mmHg   Bicarbonate 17.0 (L) 20.0 - 28.0 mmol/L   TCO2 18 (L) 22 - 32 mmol/L   O2 Saturation 22.0 %   Acid-base deficit 11.0 (H) 0.0 - 2.0 mmol/L   Patient temperature 97.5 F    Collection site BRACHIAL ARTERY    Drawn by RT    Sample type ARTERIAL    Comment NOTIFIED PHYSICIAN   HIV antibody     Status: None   Collection Time: 01/10/18 12:45  AM  Result Value Ref Range   HIV Screen 4th Generation wRfx Non Reactive Non Reactive    Comment: (NOTE) Performed At: [facility] [address] [place], [place] [id number] Rush Farmer MD FA:[id number]   Culture, blood (routine x 2)     Status: None (Preliminary result)   Collection Time: 01/10/18  1:00 AM  Result Value Ref Range   Specimen Description BLOOD CENTRAL LINE    Special Requests      BOTTLES DRAWN AEROBIC ONLY Blood Culture adequate volume Performed at [facility], [place] [address], [place], [place] [address]    Culture PENDING    Report Status PENDING   Prepare fresh frozen plasma     Status: None   Collection Time: 01/10/18  1:32 AM  Result Value Ref Range   Unit Number [id number]    Blood Component Type THW PLS APHR    Unit division B0    Status of Unit ISSUED,FINAL    Transfusion Status      OK TO TRANSFUSE Performed at [facility], [place] [address], [place], [place] [address]    Unit Number [id number]    Blood Component Type THAWED PLASMA    Unit division 00    Status of Unit ISSUED,FINAL    Transfusion Status OK TO TRANSFUSE   Glucose, capillary     Status: Abnormal   Collection Time: 01/10/18  2:24 AM  Result Value Ref Range   Glucose-Capillary 223 (H) 70 - 99 mg/dL  I-STAT 3, arterial blood gas (G3+)     Status: Abnormal   Collection Time: 01/10/18  3:16 AM  Result Value Ref Range   pH, Arterial 7.543 (H) 7.350 - 7.450   pCO2 arterial <15.0 (LL) 32.0 - 48.0 mmHg   pO2, Arterial 43.0 (L) 83.0 - 108.0 mmHg   Bicarbonate 14.9 (L)  20.0 - 28.0 mmol/L   TCO2 16 (L) 22 - 32 mmol/L   O2 Saturation 97.0 %   Acid-base deficit 10.0 (H) 0.0 - 2.0 mmol/L   Patient temperature 23.0 C    Collection site CRRT    Sample type ARTERIAL    Comment NOTIFIED PHYSICIAN   Basic metabolic panel     Status: Abnormal   Collection Time: 01/10/18  3:54 AM  Result Value Ref Range   Sodium 136 135 - 145 mmol/L    Comment: DELTA CHECK NOTED    Potassium 5.5 (H) 3.5 - 5.1 mmol/L   Chloride 92 (L) 98 - 111 mmol/L   CO2 17 (L) 22 - 32 mmol/L   Glucose, Bld 255 (H) 70 - 99 mg/dL   BUN 33 (H) 8 - 23 mg/dL   Creatinine, Ser 7.24 (H) 0.61 - 1.24 mg/dL   Calcium 8.4 (L) 8.9 - 10.3 mg/dL   GFR calc non Af Amer 7 (L) >60 mL/min   GFR calc Af Amer 8 (L) >60 mL/min    Comment: (NOTE) The eGFR has been calculated using the CKD EPI equation. This calculation has not been validated in all clinical situations. eGFR's persistently <60 mL/min signify possible Chronic Kidney Disease.    Anion gap 27 (H) 5 - 15    Comment: Performed at [facility], [place] [address] [place] [address] [place], [place] [address]  CBC     Status: Abnormal   Collection Time: 01/10/18  3:54 AM  Result Value Ref Range   WBC 14.4 (H) 4.0 - 10.5 K/uL   RBC 3.55 (L) 4.22 - 5.81 MIL/uL   Hemoglobin 10.4 (L) 13.0 - 17.0 g/dL   HCT 34.3 (L) 39.0 - 52.0 %   MCV 96.6 78.0 - 100.0 fL   MCH 29.3 26.0 - 34.0 pg   MCHC 30.3 30.0 - 36.0 g/dL   RDW 16.3 (H) 11.5 - 15.5 %   Platelets 234 150 - 400 K/uL    Comment: Performed at [facility], [place] [address] [place], [place] [address]  Magnesium     Status: None   Collection Time: 01/10/18  3:54 AM  Result Value Ref Range   Magnesium 2.2 1.7 - 2.4 mg/dL    Comment: Performed at [facility] [address], [place], [place] [address]  Phosphorus     Status: Abnormal   Collection Time: 01/10/18  3:54 AM  Result Value Ref Range   Phosphorus 8.0 (H) 2.5 - 4.6 mg/dL    Comment: Performed at [facility] [address], [place], [place] [address]  Troponin I (q 6hr x 3)     Status: Abnormal   Collection Time: 01/10/18  3:54 AM  Result Value Ref Range   Troponin I 0.33 (HH) <0.03 ng/mL    Comment: CRITICAL RESULT CALLED TO, READ BACK BY AND VERIFIED WITH: Everardo Beals [id number] [facility] Performed at [facility], [place] [address] [place], [place] [address]   Lactic acid, plasma     Status: Abnormal    Collection Time: 01/10/18  3:54 AM  Result Value Ref Range   Lactic Acid, Venous 10.9 (HH) 0.5 - 1.9 mmol/L    Comment: RESULTS CONFIRMED BY MANUAL DILUTION CRITICAL RESULT CALLED TO, READ BACK BY AND VERIFIED WITH: Geannie Risen [phone] [facility] Performed at [facility], [address] [place], [place] [address]   Procalcitonin     Status: None   Collection Time:  01/10/18  3:54 AM  Result Value Ref Range   Procalcitonin 3.04 ng/mL    Comment:        Interpretation: PCT > 2 ng/mL: Systemic infection (sepsis) is likely, unless other causes are known. (NOTE)       Sepsis PCT Algorithm           Lower Respiratory Tract                                      Infection PCT Algorithm    ----------------------------     ----------------------------         PCT < 0.25 ng/mL                PCT < 0.10 ng/mL         Strongly encourage             Strongly discourage   discontinuation of antibiotics    initiation of antibiotics    ----------------------------     -----------------------------       PCT 0.25 - 0.50 ng/mL            PCT 0.10 - 0.25 ng/mL               OR       >80% decrease in PCT            Discourage initiation of                                            antibiotics      Encourage discontinuation           of antibiotics    ----------------------------     -----------------------------         PCT >= 0.50 ng/mL              PCT 0.26 - 0.50 ng/mL               AND       <80% decrease in PCT              Encourage initiation of                                             antibiotics       Encourage continuation           of antibiotics    ----------------------------     -----------------------------        PCT >= 0.50 ng/mL                  PCT > 0.50 ng/mL               AND         increase in PCT                  Strongly encourage                                      initiation of antibiotics    Strongly encourage escalation  of antibiotics                                      -----------------------------                                           PCT <= 0.25 ng/mL                                                 OR                                        > 80% decrease in PCT                                     Discontinue / Do not initiate                                             antibiotics Performed at [facility], [place] [address] [place], [place] [address]   Lipase, blood     Status: None   Collection Time: 01/10/18  3:57 AM  Result Value Ref Range   Lipase 37 11 - 51 U/L    Comment: Performed at [facility], [place] [address] [place],  [address]  Protime-INR     Status: Abnormal   Collection Time: 01/10/18  3:58 AM  Result Value Ref Range   Prothrombin Time 47.5 (H) 11.4 - 15.2 seconds    Comment: REPEATED TO VERIFY   INR 5.21 (HH)     Comment: REPEATED TO VERIFY CRITICAL RESULT CALLED TO, READ BACK BY AND VERIFIED WITH: M.ELLIOTT,RN 9892 01/10/18 G.MCADOO Performed at [facility], [place] [address], [place], [place] [address]   Glucose, capillary     Status: Abnormal   Collection Time: 01/10/18  4:05 AM  Result Value Ref Range   Glucose-Capillary 197 (H) 70 - 99 mg/dL   Comment 1 Notify RN   .Cooxemetry Panel (carboxy, met, total hgb, O2 sat)     Status: Abnormal   Collection Time: 01/10/18  4:30 AM  Result Value Ref Range   Total hemoglobin 8.7 (L) 12.0 - 16.0 g/dL   O2 Saturation 34.1 %   Carboxyhemoglobin 0.6 0.5 - 1.5 %   Methemoglobin 1.9 (H) 0.0 - 1.5 %  Prepare fresh frozen plasma     Status: None   Collection Time: 01/10/18  5:06 AM  Result Value Ref Range   Unit Number [id number]    Blood Component Type THAWED PLASMA    Unit division 00    Status of Unit ISSUED,FINAL    Transfusion Status OK TO TRANSFUSE    Unit Number [id number]    Blood Component Type THAWED PLASMA    Unit division 00    Status of Unit ISSUED,FINAL    Transfusion Status      OK  TO  TRANSFUSE Performed at [facility], [place] [address] [place], [place] [address]    Unit Number [id number]    Blood Component Type THAWED PLASMA    Unit division 00    Status of Unit ISSUED,FINAL    Transfusion Status OK TO TRANSFUSE   Type and screen [facility]     Status: None   Collection Time: 01/10/18  5:10 AM  Result Value Ref Range   ABO/RH(D) A POS    Antibody Screen NEG    Sample Expiration      01/13/2018 Performed at [facility], [place] [address], [place], [place] [address]   Protime-INR     Status: Abnormal   Collection Time: 01/10/18  6:33 AM  Result Value Ref Range   Prothrombin Time 38.0 (H) 11.4 - 15.2 seconds   INR 3.91     Comment: Performed at [facility] [address], [place], [place] [address]  Lactic acid, plasma     Status: Abnormal   Collection Time: 01/10/18  7:13 AM  Result Value Ref Range   Lactic Acid, Venous 12.7 (HH) 0.5 - 1.9 mmol/L    Comment: RESULTS CONFIRMED BY MANUAL DILUTION CRITICAL RESULT CALLED TO, READ BACK BY AND VERIFIED WITHAleen Sells RN [phone] 01/10/2018 BY A BENNETT Performed at [facility], [place] [address] [place], [place] [address]   CBC     Status: Abnormal   Collection Time: 01/10/18  7:13 AM  Result Value Ref Range   WBC 15.1 (H) 4.0 - 10.5 K/uL   RBC 4.09 (L) 4.22 - 5.81 MIL/uL   Hemoglobin 12.3 (L) 13.0 - 17.0 g/dL   HCT 40.5 39.0 - 52.0 %   MCV 99.0 78.0 - 100.0 fL   MCH 30.1 26.0 - 34.0 pg   MCHC 30.4 30.0 - 36.0 g/dL   RDW 16.5 (H) 11.5 - 15.5 %   Platelets 259 150 - 400 K/uL    Comment: Performed at [facility] [address] [place], [place] [address]  Basic metabolic panel     Status: Abnormal   Collection Time: 01/10/18  7:13 AM  Result Value Ref Range   Sodium 138 135 - 145 mmol/L   Potassium 4.9 3.5 - 5.1 mmol/L   Chloride 90 (L) 98 - 111 mmol/L   CO2 17 (L) 22 - 32 mmol/L   Glucose, Bld 221 (H) 70 - 99 mg/dL   BUN 33 (H) 8 - 23 mg/dL    Creatinine, Ser 7.42 (H) 0.61 - 1.24 mg/dL   Calcium 8.6 (L) 8.9 - 10.3 mg/dL   GFR calc non Af Amer 7 (L) >60 mL/min   GFR calc Af Amer 8 (L) >60 mL/min    Comment: (NOTE) The eGFR has been calculated using the CKD EPI equation. This calculation has not been validated in all clinical situations. eGFR's persistently <60 mL/min signify possible Chronic Kidney Disease.    Anion gap 31 (H) 5 - 15    Comment: Performed at [facility], [place] [address] [place], [place] [address]  Prepare fresh frozen plasma     Status: None   Collection Time: 01/10/18  7:38 AM  Result Value Ref Range   Unit Number [id number]    Blood Component Type THAWED PLASMA    Unit division 00    Status of Unit ISSUED,FINAL    Transfusion Status      OK TO TRANSFUSE Performed at  [facility], [place] [address], [place] [place], [place] [address]   Glucose, capillary     Status: Abnormal   Collection Time: 01/10/18  7:57 AM  Result Value Ref Range   Glucose-Capillary 203 (H) 70 - 99 mg/dL   Comment 1 Capillary Specimen   Body fluid culture     Status: None (Preliminary result)   Collection Time: 01/10/18  9:28 AM  Result Value Ref Range   Specimen Description FLUID PERICARDIAL    Special Requests C    Gram Stain      MODERATE WBC PRESENT, PREDOMINANTLY PMN NO ORGANISMS SEEN Performed at [facility], [place] [address] [place], [place] [address]    Culture PENDING    Report Status PENDING   I-STAT 7, (LYTES, BLD GAS, ICA, H+H)     Status: Abnormal   Collection Time: 01/10/18 10:11 AM  Result Value Ref Range   pH, Arterial 7.134 (LL) 7.350 - 7.450   pCO2 arterial 46.6 32.0 - 48.0 mmHg   pO2, Arterial 310.0 (H) 83.0 - 108.0 mmHg   Bicarbonate 15.8 (L) 20.0 - 28.0 mmol/L   TCO2 17 (L) 22 - 32 mmol/L   O2 Saturation 100.0 %   Acid-base deficit 13.0 (H) 0.0 - 2.0 mmol/L   Sodium 133 (L) 135 - 145 mmol/L   Potassium 4.2 3.5 - 5.1 mmol/L   Calcium, Ion 0.95 (L) 1.15 - 1.40 mmol/L   HCT 28.0 (L)  39.0 - 52.0 %   Hemoglobin 9.5 (L) 13.0 - 17.0 g/dL   Patient temperature 36.3 C    Sample type ARTERIAL    Comment NOTIFIED PHYSICIAN   Body fluid culture     Status: None (Preliminary result)   Collection Time: 01/10/18 10:15 AM  Result Value Ref Range   Specimen Description FLUID PERICARDIAL    Special Requests B    Gram Stain      MODERATE WBC PRESENT, PREDOMINANTLY PMN NO ORGANISMS SEEN Performed at [facility], [place] [address], [place], [place] [address]    Culture PENDING    Report Status PENDING   Glucose, capillary     Status: Abnormal   Collection Time: 01/10/18 12:14 PM  Result Value Ref Range   Glucose-Capillary 185 (H) 70 - 99 mg/dL   Comment 1 Capillary Specimen   Glucose, capillary     Status: Abnormal   Collection Time: 01/10/18  4:05 PM  Result Value Ref Range   Glucose-Capillary 141 (H) 70 - 99 mg/dL   Comment 1 Capillary Specimen   Glucose, capillary     Status: Abnormal   Collection Time: 01/10/18  7:54 PM  Result Value Ref Range   Glucose-Capillary 108 (H) 70 - 99 mg/dL   Comment 1 Notify RN   Glucose, capillary     Status: Abnormal   Collection Time: 01/10/18 11:17 PM  Result Value Ref Range   Glucose-Capillary 136 (H) 70 - 99 mg/dL   Comment 1 Notify RN   Lactic acid, plasma     Status: Abnormal   Collection Time: 01/11/18 12:05 AM  Result Value Ref Range   Lactic Acid, Venous 10.8 (HH) 0.5 - 1.9 mmol/L    Comment: CRITICAL RESULT CALLED TO, READ BACK BY AND VERIFIED WITH: Celene Skeen RN 01/11/2018 0143 JORDANS RESULTS CONFIRMED BY MANUAL DILUTION Performed at [facility], [place] [address], [place], [place] [address]   Renal function panel     Status: Abnormal   Collection Time: 01/11/18 12:05  AM  Result Value Ref Range   Sodium 136 135 - 145 mmol/L   Potassium 6.2 (H) 3.5 - 5.1 mmol/L    Comment: DELTA CHECK NOTED   Chloride 91 (L) 98 - 111 mmol/L   CO2 16 (L) 22 - 32 mmol/L   Glucose, Bld 145 (H) 70 - 99 mg/dL   BUN 43 (H) 8  - 23 mg/dL   Creatinine, Ser 8.58 (H) 0.61 - 1.24 mg/dL   Calcium 8.4 (L) 8.9 - 10.3 mg/dL   Phosphorus 8.2 (H) 2.5 - 4.6 mg/dL   Albumin 3.1 (L) 3.5 - 5.0 g/dL   GFR calc non Af Amer 6 (L) >60 mL/min   GFR calc Af Amer 7 (L) >60 mL/min    Comment: (NOTE) The eGFR has been calculated using the CKD EPI equation. This calculation has not been validated in all clinical situations. eGFR's persistently <60 mL/min signify possible Chronic Kidney Disease.    Anion gap 29 (H) 5 - 15    Comment: RESULT CHECKED Performed at [facility], [place] [address], [place], [place] [address]   Magnesium     Status: None   Collection Time: 01/11/18 12:08 AM  Result Value Ref Range   Magnesium 2.2 1.7 - 2.4 mg/dL    Comment: Performed at [facility], [place] [address], [place], [place] [address]  Phosphorus     Status: Abnormal   Collection Time: 01/11/18 12:08 AM  Result Value Ref Range   Phosphorus 8.1 (H) 2.5 - 4.6 mg/dL    Comment: Performed at [place] [address] [place],  [address]  Protime-INR     Status: Abnormal   Collection Time: 01/11/18 12:08 AM  Result Value Ref Range   Prothrombin Time 40.8 (H) 11.4 - 15.2 seconds   INR 4.28 (HH)     Comment: REPEATED TO VERIFY CRITICAL RESULT CALLED TO, READ BACK BY AND VERIFIED WITHTheodoro Grist RN [phone] [id number] SHORTT Performed at [place] [address], [place],  [address]   Procalcitonin     Status: None   Collection Time: 01/11/18 12:08 AM  Result Value Ref Range   Procalcitonin 21.21 ng/mL    Comment:        Interpretation: PCT >= 10 ng/mL: Important systemic inflammatory response, almost exclusively due to severe bacterial sepsis or septic shock. (NOTE)       Sepsis PCT Algorithm           Lower Respiratory Tract                                      Infection PCT Algorithm    ----------------------------     ----------------------------         PCT < 0.25 ng/mL                PCT <  0.10 ng/mL         Strongly encourage             Strongly discourage   discontinuation of antibiotics    initiation of antibiotics    ----------------------------     -----------------------------       PCT 0.25 - 0.50 ng/mL            PCT 0.10 - 0.25 ng/mL               OR       >  80% decrease in PCT            Discourage initiation of                                            antibiotics      Encourage discontinuation           of antibiotics    ----------------------------     -----------------------------         PCT >= 0.50 ng/mL              PCT 0.26 - 0.50 ng/mL                AND       <80% decrease in PCT             Encourage initiation of                                             antibiotics       Encourage continuation           of antibiotics    ----------------------------     -----------------------------        PCT >= 0.50 ng/mL                  PCT > 0.50 ng/mL               AND         increase in PCT                  Strongly encourage                                      initiation of antibiotics    Strongly encourage escalation           of antibiotics                                     -----------------------------                                           PCT <= 0.25 ng/mL                                                 OR                                        > 80% decrease in PCT                                     Discontinue / Do not initiate  antibiotics Performed at [facility], [place] [address], [place], [place] [address]   Glucose, capillary     Status: Abnormal   Collection Time: 01/11/18  1:24 AM  Result Value Ref Range   Glucose-Capillary 168 (H) 70 - 99 mg/dL   Comment 1 Arterial Specimen   Prepare fresh frozen plasma     Status: None (Preliminary result)   Collection Time: 01/11/18  1:41 AM  Result Value Ref Range   Unit Number [id number]    Blood Component Type THAWED PLASMA     Unit division 00    Status of Unit ISSUED    Transfusion Status      OK TO TRANSFUSE Performed at [facility], [place] [address], [place], [place] [address]    Unit Number [id number]    Blood Component Type THAWED PLASMA    Unit division 00    Status of Unit ISSUED    Transfusion Status OK TO TRANSFUSE   Lactic acid, plasma     Status: Abnormal   Collection Time: 01/11/18  3:05 AM  Result Value Ref Range   Lactic Acid, Venous 8.5 (HH) 0.5 - 1.9 mmol/L    Comment: CRITICAL RESULT CALLED TO, READ BACK BY AND VERIFIED WITH: Larey Seat [id number] [place] Performed at [facility], [place] [address] [place], [place] [address]   Glucose, capillary     Status: Abnormal   Collection Time: 01/11/18  3:46 AM  Result Value Ref Range   Glucose-Capillary 188 (H) 70 - 99 mg/dL   Comment 1 Notify RN   CBC     Status: Abnormal   Collection Time: 01/11/18  4:12 AM  Result Value Ref Range   WBC 16.8 (H) 4.0 - 10.5 K/uL    Comment: REPEATED TO VERIFY   RBC 3.24 (L) 4.22 - 5.81 MIL/uL   Hemoglobin 9.6 (L) 13.0 - 17.0 g/dL    Comment: REPEATED TO VERIFY   HCT 29.3 (L) 39.0 - 52.0 %   MCV 90.4 78.0 - 100.0 fL    Comment: REPEATED TO VERIFY QUESTIONABLE RESULTS, RECOMMEND RECOLLECT TO VERIFY NOTIFIED L. SHORTS, RN [phone] 01/11/2018 BY MACEDA,J. RN SAID TO RESULT AND IT WOULD BE CHECKED LATER.    MCH 29.6 26.0 - 34.0 pg   MCHC 32.8 30.0 - 36.0 g/dL   RDW 16.2 (H) 11.5 - 15.5 %   Platelets 235 150 - 400 K/uL    Comment: Performed at [facility] [address], [place], [place] [address]  Comprehensive metabolic panel     Status: Abnormal   Collection Time: 01/11/18  4:12 AM  Result Value Ref Range   Sodium 136 135 - 145 mmol/L   Potassium 6.0 (H) 3.5 - 5.1 mmol/L   Chloride 91 (L) 98 - 111 mmol/L   CO2 18 (L) 22 - 32 mmol/L   Glucose, Bld 189 (H) 70 - 99 mg/dL   BUN 40 (H) 8 - 23 mg/dL   Creatinine, Ser 8.00 (H) 0.61 - 1.24 mg/dL   Calcium 8.0 (L) 8.9 - 10.3 mg/dL    Total Protein 7.1 6.5 - 8.1 g/dL   Albumin 3.1 (L) 3.5 - 5.0 g/dL   AST 12,598 (H) 15 - 41 U/L    Comment: RESULTS CONFIRMED BY MANUAL DILUTION   ALT 7,765 (H) 0 - 44 U/L    Comment: RESULTS CONFIRMED BY MANUAL DILUTION   Alkaline Phosphatase 162 (H) 38 - 126 U/L   Total Bilirubin 3.0 (  H) 0.3 - 1.2 mg/dL   GFR calc non Af Amer 6 (L) >60 mL/min   GFR calc Af Amer 7 (L) >60 mL/min    Comment: (NOTE) The eGFR has been calculated using the CKD EPI equation. This calculation has not been validated in all clinical situations. eGFR's persistently <60 mL/min signify possible Chronic Kidney Disease. CORRECTED ON 08/13 AT 0651: PREVIOUSLY REPORTED AS 7    Anion gap 27 (H) 5 - 15    Comment: RESULT CHECKED Performed at [facility], [place] [address] [place], [place] [address]   Lipase, blood     Status: Abnormal   Collection Time: 01/11/18  4:12 AM  Result Value Ref Range   Lipase 117 (H) 11 - 51 U/L    Comment: Performed at [facility], [place] [address], [place], [place] [address]  Magnesium     Status: None   Collection Time: 01/11/18  4:12 AM  Result Value Ref Range   Magnesium 2.2 1.7 - 2.4 mg/dL    Comment: Performed at [facility] [address] [place], [place] [address]  Protime-INR     Status: Abnormal   Collection Time: 01/11/18  4:12 AM  Result Value Ref Range   Prothrombin Time 34.1 (H) 11.4 - 15.2 seconds   INR 3.41     Comment: Performed at [facility] [address] [place], [place] [address]  Troponin I     Status: Abnormal   Collection Time: 01/11/18  4:12 AM  Result Value Ref Range   Troponin I 2.35 (HH) <0.03 ng/mL    Comment: CRITICAL RESULT CALLED TO, READ BACK BY AND VERIFIED WITH: Celene Skeen RN 01/11/2018 4193 JORDANS Performed at [facility], [place] [address], [place], [place] [address]     ECG   N/A  Telemetry   A-fib with RVR- Personally Reviewed  Radiology    Dg Chest 2 View  Result Date:  01/09/2018 CLINICAL DATA:  Dialysis treatment today. Patient not feeling well since that time. EXAM: CHEST - 2 VIEW COMPARISON:  March 05, 2016 FINDINGS: Stable cardiomegaly. Increased opacity in left retrocardiac region may represent atelectasis or infiltrate. This is best seen on the lateral view. The hila and mediastinum are normal. No pneumothorax. No pulmonary nodules or masses. IMPRESSION: Left retrocardiac opacity could represent atelectasis or infiltrate. Recommend clinical correlation and follow-up to resolution. Electronically Signed   By: Dorise Bullion III M.D   On: 01/09/2018 16:08   Dg Abd 1 View  Result Date: 01/10/2018 CLINICAL DATA:  Orogastric tube placement EXAM: ABDOMEN - 1 VIEW COMPARISON:  CT from earlier the same day FINDINGS: Enteric tube has been advanced into the stomach which is nondilated. Visualized bowel gas pattern normal. IVC filter at the L2 level. IMPRESSION: Enteric tube to the gastric fundus.  Normal bowel gas pattern. Electronically Signed   By: Lucrezia Europe [name]   On: 01/10/2018 08:00   Dg Abd 1 View  Result Date: 01/09/2018 CLINICAL DATA:  Abdominal pain EXAM: ABDOMEN - 1 VIEW COMPARISON:  None. FINDINGS: Scattered large and small bowel gas is noted. IVC filter is noted in place. Diffuse vascular calcifications are seen. No acute bony abnormality is noted. IMPRESSION: No acute abnormality seen. Electronically Signed   By: Inez Catalina [name]   On: 01/09/2018 23:13   Ct Abdomen Pelvis W Contrast  Result Date: 01/10/2018 CLINICAL DATA:  Abdominal pain, sepsis, hypotension. Severe acute abdominal pain and shock. History  of end-stage renal disease on hemodialysis. EXAM: CT ABDOMEN AND PELVIS WITH CONTRAST TECHNIQUE: Multidetector CT imaging of the abdomen and pelvis was performed using the standard protocol following bolus administration of intravenous contrast. CONTRAST:  137m OMNIPAQUE IOHEXOL 300 MG/ML  SOLN COMPARISON:  None. FINDINGS: Lower chest: Small left pleural  effusion. Consolidation in the left lung base may be due to atelectasis or pneumonia. Less prominent atelectasis or infiltration in the right lung base. Large pericardial effusion. Heart size is normal. Hepatobiliary: Diffuse fatty infiltration of the liver. There is a large stone in the gallbladder. Gallbladder wall is thickened and there is edema around the gallbladder suggesting cholecystitis. No bile duct dilatation. Pancreas: Hazy outline of the pancreas particularly around the head with infiltration in the peripancreatic fat. Changes are consistent with acute pancreatitis. No loculated collections identified. Spleen: Normal in size without focal abnormality. Adrenals/Urinary Tract: Adrenal glands are unremarkable. Kidneys are normal, without renal calculi, focal lesion, or hydronephrosis. Bladder is decompressed. Stomach/Bowel: Stomach, small bowel, and colon are not abnormally distended. No wall thickening is identified although under distention limits evaluation of bowel wall. No inflammatory changes suggested. Appendix is normal. Vascular/Lymphatic: Aortic atherosclerosis. Prominent vascular calcifications throughout the abdomen and pelvis. No enlarged abdominal or pelvic lymph nodes. Inferior vena caval filter present. Reproductive: Prostate is unremarkable. Other: No free air or free fluid in the abdomen. Small periumbilical hernias containing fat. Lipoma in the left inguinal canal. Musculoskeletal: Degenerative changes in the spine. Bridging anterior osteophytes. Disc space narrowing with endplate sclerosis and erosion demonstrated at the lumbosacral inter space. This was present on a previous study from 03/10/2016 although there is progression. The previous study demonstrated degenerative changes with Schmorl's nodes period changes today likely also represent degenerative change although discitis is not entirely excluded. Consider MRI for further evaluation if clinically indicated. IMPRESSION: 1.  Cholelithiasis with gallbladder wall thickening and pericholecystic edema suggesting cholecystitis. 2. Infiltration in the peripancreatic fat consistent with acute pancreatitis. No loculated collections. 3. Diffuse fatty infiltration of the liver. 4. Small left pleural effusion. Consolidation in the left lung base may be due to atelectasis or pneumonia. Less prominent atelectasis or infiltration in the right lung base. 5. Large pericardial effusion. 6. Small periumbilical hernias containing fat. Lipoma in the left inguinal canal. 7. Disc space narrowing and erosion at the lumbosacral junction. This was present on the previous study from 2017 and likely represents degenerative change although progression is shown. Consider MRI for further evaluation if clinically indicated. 8. Aortic atherosclerosis with extensive vascular calcifications. Electronically Signed   By: WLucienne CapersM.D.   On: 01/10/2018 02:28   Dg Chest Port 1 View  Result Date: 01/11/2018 CLINICAL DATA:  Chest tube, pericardial drain EXAM: PORTABLE CHEST 1 VIEW COMPARISON:  None. FINDINGS: Support devices are stable. Mild cardiomegaly, vascular congestion and bibasilar atelectasis, slightly improved since prior study. IMPRESSION: Improving vascular congestion and bibasilar atelectasis. Electronically Signed   By: KRolm BaptiseM.D.   On: 01/11/2018 07:31   Dg Chest Port 1 View  Result Date: 01/11/2018 CLINICAL DATA:  Central line placement EXAM: PORTABLE CHEST 1 VIEW COMPARISON:  01/10/2018 FINDINGS: Endotracheal tube with tip measuring 6.5 cm above the carina. Enteric tube tip is off the field of view but below the left hemidiaphragm. Left central venous catheter is in place with tip over the low SVC region. A new right central venous catheter is placed with tip over the mid SVC region. No pneumothorax. Mild cardiac enlargement. No vascular congestion. Atelectasis in  the lung bases. Aortic calcification. IMPRESSION: Appliances appear in  satisfactory position. Cardiac enlargement. Atelectasis in the lung bases. No pneumothorax. Electronically Signed   By: Lucienne Capers [name]   On: 01/11/2018 03:33   Dg Chest Port 1 View  Result Date: 01/10/2018 CLINICAL DATA:  Acute hypoxic respiratory failure. Shock. Pericardial effusion. EXAM: PORTABLE CHEST 1 VIEW COMPARISON:  Chest x-rays dated 01/10/2018 and CT scan of the abdomen dated 01/10/2018 FINDINGS: Endotracheal tube tip is 5.4 cm above the carina. Left central line tip is at the cavoatrial junction, unchanged. NG tube tip is in the fundus of the stomach. There is a tube at the left lung base which may represent a tube in the pericardial space. Cardiac silhouette is prominent but unchanged. Pulmonary vascularity is normal. Persistent consolidation in the left lower lobe. Right lung is clear. IMPRESSION: 1. Persistent left lower lobe pneumonia. 2. Persistent prominence of the cardiac silhouette. There appears to be a pericardial drainage tube in place. 3. Other support apparatus appears in good position. Electronically Signed   By: Lorriane Shire [name]   On: 01/10/2018 11:29   Portable Chest X-ray  Result Date: 01/10/2018 CLINICAL DATA:  67 y/o  M; ET tube placement. EXAM: PORTABLE CHEST 1 VIEW COMPARISON:  01/10/2018 chest radiograph. FINDINGS: Left central venous catheter tip is stable projecting over lower SVC. Endotracheal tube tip projects 5.3 cm above the carina. Enteric tube tip extends below the field of view. Stable cardiomegaly and calcific aortic atherosclerosis. Stable left lower lobe airspace opacity and probable superimposed pulmonary edema. IMPRESSION: 1. Endotracheal tube tip 5.3 cm above carina. 2. Enteric tube tip below field of view in the abdomen. 3. Stable left central venous catheter. 4. Stable left basilar opacity, probable pulmonary edema, cardiomegaly, aortic atherosclerosis. Electronically Signed   By: Kristine Garbe [name]   On: 01/10/2018 06:46   Dg Chest  Port 1 View  Result Date: 01/10/2018 CLINICAL DATA:  Status post central line placement EXAM: PORTABLE CHEST 1 VIEW COMPARISON:  01/09/2018 FINDINGS: Left jugular central line is now seen at the cavoatrial junction. No pneumothorax is noted. Cardiac shadow is enlarged but stable. Left basilar atelectatic changes are again noted and stable. IMPRESSION: No pneumothorax following central line placement. The remainder of the exam is stable. Electronically Signed   By: Inez Catalina [name]   On: 01/10/2018 00:26   Dg Chest Port 1 View  Result Date: 01/09/2018 CLINICAL DATA:  Evaluate pneumonia. EXAM: PORTABLE CHEST 1 VIEW COMPARISON:  01/09/2018. FINDINGS: Stable cardiac enlargement and aortic atherosclerosis. Significantly diminished lung volumes. Mild pulmonary edema suspected. Left lower lobe airspace opacity is unchanged from previous exam. IMPRESSION: 1. No change in aeration to the left lung base compared with previous exam. 2. Suspect mild CHF. Aortic Atherosclerosis (ICD10-I70.0). Electronically Signed   By: Kerby Moors [name]   On: 01/09/2018 23:14   US Abdomen Limited Ruq  Result Date: 01/10/2018 CLINICAL DATA:  Abdominal pain. Abnormal CT scan. CT 01/11/2008 demonstrated cholelithiasis with suggestion of cholecystitis. Pancreatitis. EXAM: ULTRASOUND ABDOMEN LIMITED RIGHT UPPER QUADRANT COMPARISON:  CT abdomen and pelvis 01/10/2018 FINDINGS: Gallbladder: Large stone in the gallbladder measuring 2.2 cm diameter. Diffusely thickened gallbladder wall measuring up to 8 mm. Murphy's sign is negative. Common bile duct: Diameter: 4.9 mm, normal. Liver: No focal lesion identified. Within normal limits in parenchymal echogenicity. Portal vein is patent on color Doppler imaging with normal direction of blood flow towards the liver. IMPRESSION: Cholelithiasis with diffuse gallbladder wall thickening. Negative Murphy's sign. Changes  are nonspecific but may indicate acute cholecystitis in the appropriate clinical  setting. Electronically Signed   By: Lucienne Capers [name]   On: 01/10/2018 03:35    Cardiac Studies   N/A  Assessment   1. Active Problems: 2.   Hypotension 3.   Sepsis ([place]) 4.   Pericardial effusion with cardiac tamponade 5.   Plan   1. Mr. Sadowski has markedly abnormal labs today - sent for repeat, but suspect values are accurate and reflect multi-organ system failure. On levo/vaso and broad-spectrum antibiotics. CRRT continues with UF - ?from nursing staff as to why dialyzing against a 4K bath (he is hyperkalemic at 6.0). ?source of sepsis - may be GI/biliary - doubt surgery will intervene give his critical illness - ?percutaneous IR biliary drainage (noted to have cholelithiasis and diffuse GB thickening).  Time Spent Directly with Patient:  I have spent a total of 35 minutes with the patient reviewing hospital notes, telemetry, EKGs, labs and examining the patient as well as establishing an assessment and plan that was discussed personally with the patient.  > 50% of time was spent in direct patient care.  Length of Stay:  LOS: 2 days   Pixie Casino, MD, [facility], [place] Director of the Advanced Lipid Disorders &  Cardiovascular Risk Reduction Clinic Diplomate of the American Board of Clinical Lipidology Attending Cardiologist  Direct Dial: [phone]  Fax: [phone]  Website:  [email or web address] 01/11/2018, 8:17 AM

## 2018-01-11 NOTE — Progress Notes (Signed)
Called to bedside.  Patient started having pauses, up to 6 secs with hypotension on aline around 2330.  Istat showing K 6.3, verified with BMET sent to lab at 6.2.  Cardiology at bedside and ordered hyperkalemia treatment with calcium, insulin, dextrose.  ABG done showing 7.349/28.2/191/15.5 on MV settings PRVC 640/34/10/60%.  Vasopressors currently at levophed at 50 mcg/min and vasopressin.    P:  Lactic pending Called Dr. Carolin Sicks with renal, who agrees with starting CRRT tonight Will give additional 2 amps of bicarb and start gtt at 75 ml/hr as we are maxed on ventilator settings for metabolic acidosis INR rechecked and at 4.28, therefore will given vit K 10 mg IV now and FFP 2 units After vit K and FFP started will placed temp HD catheter   Kennieth Rad, AGACNP-BC [place] Pgr: [phone] or if no answer [phone] 01/11/2018, 2:11 AM

## 2018-01-11 NOTE — Procedures (Addendum)
Central Venous Catheter/Temporary Hemodialysis Catheter Insertion Procedure Note [name] [id number] May 19, 1951  Procedure: Insertion of temporary hemodialysis catheter Indications: hyperkalemia; ESRD; loss of HD access (AV fistula clotted)  Procedure Details Consent: Risks of procedure as well as the alternatives and risks of each were explained to the (patient/caregiver).  Consent for procedure obtained.  Time Out: Verified patient identification, verified procedure, site/side was marked, verified correct patient position, special equipment/implants available, medications/allergies/relevent history reviewed, required imaging and test results available.  Performed  Maximum sterile technique was used:  antiseptics, cap, gloves, gown, hand hygiene, mask and sheet.  Skin prep: Chlorhexidine; local anesthetic administered  A 13 Fr Trialysis hemodialysis catheter was placed under ultrasound guidance at 18 cm in the right internal jugular vein using the Seldinger technique.  Evaluation Blood flow good Complications: No apparent complications Patient did tolerate procedure well. Chest X-ray ordered to verify placement.  CXR: normal.  Renee Pain, MD Board Certified by the ABIM, Pulmonary Diseases & Critical Care Medicine  01/11/2018, 3:12 AM

## 2018-01-12 ENCOUNTER — Inpatient Hospital Stay (HOSPITAL_COMMUNITY): Payer: Medicare Other

## 2018-01-12 DIAGNOSIS — K8500 Idiopathic acute pancreatitis without necrosis or infection: Secondary | ICD-10-CM

## 2018-01-12 DIAGNOSIS — R945 Abnormal results of liver function studies: Secondary | ICD-10-CM

## 2018-01-12 DIAGNOSIS — R7989 Other specified abnormal findings of blood chemistry: Secondary | ICD-10-CM

## 2018-01-12 LAB — BPAM FFP
Blood Product Expiration Date: 201908172359
Blood Product Expiration Date: 201908172359
ISSUE DATE / TIME: 201908130205
ISSUE DATE / TIME: 201908130205
Unit Type and Rh: 600
Unit Type and Rh: 6200

## 2018-01-12 LAB — GLUCOSE, CAPILLARY
Glucose-Capillary: 111 mg/dL — ABNORMAL HIGH (ref 70–99)
Glucose-Capillary: 121 mg/dL — ABNORMAL HIGH (ref 70–99)
Glucose-Capillary: 142 mg/dL — ABNORMAL HIGH (ref 70–99)
Glucose-Capillary: 146 mg/dL — ABNORMAL HIGH (ref 70–99)
Glucose-Capillary: 152 mg/dL — ABNORMAL HIGH (ref 70–99)
Glucose-Capillary: 89 mg/dL (ref 70–99)
Glucose-Capillary: 97 mg/dL (ref 70–99)

## 2018-01-12 LAB — PREPARE FRESH FROZEN PLASMA
Unit division: 0
Unit division: 0

## 2018-01-12 LAB — COMPREHENSIVE METABOLIC PANEL
ALT: 5783 U/L — ABNORMAL HIGH (ref 0–44)
AST: 4778 U/L — ABNORMAL HIGH (ref 15–41)
Albumin: 3 g/dL — ABNORMAL LOW (ref 3.5–5.0)
Alkaline Phosphatase: 196 U/L — ABNORMAL HIGH (ref 38–126)
Anion gap: 18 — ABNORMAL HIGH (ref 5–15)
BUN: 27 mg/dL — ABNORMAL HIGH (ref 8–23)
CO2: 21 mmol/L — ABNORMAL LOW (ref 22–32)
Calcium: 8 mg/dL — ABNORMAL LOW (ref 8.9–10.3)
Chloride: 98 mmol/L (ref 98–111)
Creatinine, Ser: 4.05 mg/dL — ABNORMAL HIGH (ref 0.61–1.24)
GFR calc Af Amer: 17 mL/min — ABNORMAL LOW (ref >60)
GFR calc non Af Amer: 14 mL/min — ABNORMAL LOW (ref >60)
Glucose, Bld: 129 mg/dL — ABNORMAL HIGH (ref 70–99)
Potassium: 4.8 mmol/L (ref 3.5–5.1)
Sodium: 137 mmol/L (ref 135–145)
Total Bilirubin: 3.5 mg/dL — ABNORMAL HIGH (ref 0.3–1.2)
Total Protein: 6.8 g/dL (ref 6.5–8.1)

## 2018-01-12 LAB — RENAL FUNCTION PANEL
Albumin: 3 g/dL — ABNORMAL LOW (ref 3.5–5.0)
Anion gap: 13 (ref 5–15)
BUN: 23 mg/dL (ref 8–23)
CO2: 25 mmol/L (ref 22–32)
Calcium: 8.5 mg/dL — ABNORMAL LOW (ref 8.9–10.3)
Chloride: 100 mmol/L (ref 98–111)
Creatinine, Ser: 3.35 mg/dL — ABNORMAL HIGH (ref 0.61–1.24)
GFR calc Af Amer: 21 mL/min — ABNORMAL LOW (ref >60)
GFR calc non Af Amer: 18 mL/min — ABNORMAL LOW (ref >60)
Glucose, Bld: 143 mg/dL — ABNORMAL HIGH (ref 70–99)
Phosphorus: 4.1 mg/dL (ref 2.5–4.6)
Potassium: 4.3 mmol/L (ref 3.5–5.1)
Sodium: 138 mmol/L (ref 135–145)

## 2018-01-12 LAB — CBC
HCT: 27.4 % — ABNORMAL LOW (ref 39.0–52.0)
Hemoglobin: 9 g/dL — ABNORMAL LOW (ref 13.0–17.0)
MCH: 29 pg (ref 26.0–34.0)
MCHC: 32.8 g/dL (ref 30.0–36.0)
MCV: 88.4 fL (ref 78.0–100.0)
Platelets: 219 10*3/uL (ref 150–400)
RBC: 3.1 MIL/uL — ABNORMAL LOW (ref 4.22–5.81)
RDW: 16.1 % — ABNORMAL HIGH (ref 11.5–15.5)
WBC: 12.8 10*3/uL — ABNORMAL HIGH (ref 4.0–10.5)

## 2018-01-12 LAB — PROTIME-INR
INR: 3.09
Prothrombin Time: 31.7 seconds — ABNORMAL HIGH (ref 11.4–15.2)

## 2018-01-12 LAB — POCT I-STAT 3, ART BLOOD GAS (G3+)
Acid-Base Excess: 5 mmol/L — ABNORMAL HIGH (ref 0.0–2.0)
Bicarbonate: 27.9 mmol/L (ref 20.0–28.0)
O2 Saturation: 100 %
Patient temperature: 97.9
TCO2: 29 mmol/L (ref 22–32)
pCO2 arterial: 34.5 mmHg (ref 32.0–48.0)
pH, Arterial: 7.515 — ABNORMAL HIGH (ref 7.350–7.450)
pO2, Arterial: 223 mmHg — ABNORMAL HIGH (ref 83.0–108.0)

## 2018-01-12 LAB — PHOSPHORUS: Phosphorus: 3.5 mg/dL (ref 2.5–4.6)

## 2018-01-12 LAB — LIPASE, BLOOD: Lipase: 41 U/L (ref 11–51)

## 2018-01-12 MED ORDER — ORAL CARE MOUTH RINSE
15.0000 mL | Freq: Two times a day (BID) | OROMUCOSAL | Status: DC
  Administered 2018-01-12: 15 mL via OROMUCOSAL

## 2018-01-12 MED ORDER — VITAL AF 1.2 CAL PO LIQD
1500.0000 mL | ORAL | Status: DC
  Administered 2018-01-12: 1500 mL
  Filled 2018-01-12 (×2): qty 1500

## 2018-01-12 MED ORDER — VITAL HIGH PROTEIN PO LIQD: 1000.0000 mL | ORAL | Status: DC

## 2018-01-12 MED ORDER — FENTANYL CITRATE (PF) 100 MCG/2ML IJ SOLN
12.5000 ug | INTRAMUSCULAR | Status: DC | PRN
  Administered 2018-01-13: 12.5 ug via INTRAVENOUS
  Filled 2018-01-12: qty 2

## 2018-01-12 NOTE — Progress Notes (Addendum)
DAILY PROGRESS NOTE   Patient Name: [name] Date of Encounter: 01/12/2018  Chief Complaint   Intubated, sedated on vent (unchanged)  Patient Profile   67 yo male with shock, found to have a uremic pericardial effusion and tamponade, s/p pericardial window, now with ongoing hypotension and known systolic CHF - EF 73%.  Subjective   On CRRT - appears to be in atrial flutter overnight. Weaned off vasopressin, still on levophed. Short run of NSVT  (6 beats) noted. Labs appear improved with CRRT - liver enzymes improved today (ALT >10K ->4778, AST 7786 -> 5783). Leukocytosis is improving (16K->12K today). Appreciate GI recs - unclear if there is biliary cholestasis or cholecystitis.  CXR personally reviewed - lungs clear, ETT appears in adequate position.  Objective   Vitals:   01/12/18 0600 01/12/18 0700 01/12/18 0730 01/12/18 0753  BP: (!) 144/88 121/82 132/79   Pulse: 100 96 99   Resp: (!) 28 (!) 23 (!) 28   Temp:    98.3 F (36.8 C)  TempSrc:    Oral  SpO2: 100% 100% 100%   Weight:      Height:        Intake/Output Summary (Last 24 hours) at 01/12/2018 0800 Last data filed at 01/12/2018 0700 Gross per 24 hour  Intake 2080.8 ml  Output 3102 ml  Net -1021.2 ml   Filed Weights   01/10/18 0418 01/11/18 0500 01/12/18 0431  Weight: 111.3 kg 114.4 kg 112.6 kg    Physical Exam   General appearance: intubated, sedated on vent Neck: no carotid bruit, no JVD, thyroid not enlarged, symmetric, no tenderness/mass/nodules and sclerae icteric, pupils dilated, minimally responsive Lungs: diminished breath sounds bibasilar Heart: irregularly irregular rhythm Abdomen: soft, non-tender; bowel sounds normal; no masses,  no organomegaly Extremities: edema 1+ edema, right TMA Pulses: weak distal pulses Skin: Skin color, texture, turgor normal. No rashes or lesions Neurologic: Mental status: intubated, sedated on vent Psych: Cannot assess  Inpatient Medications    Scheduled  Meds: . chlorhexidine gluconate (MEDLINE KIT)  15 mL Mouth Rinse BID  . Chlorhexidine Gluconate Cloth  6 each Topical Daily  . hydrocortisone sodium succinate  50 mg Intravenous Q6H  . insulin aspart  2-6 Units Subcutaneous Q4H  . mouth rinse  15 mL Mouth Rinse 10 times per day  . pantoprazole  40 mg Intravenous Q12H  . sodium chloride flush  10-40 mL Intracatheter Q12H    Continuous Infusions: . sodium chloride 10 mL/hr at 01/11/18 2000  . fentaNYL infusion INTRAVENOUS 200 mcg/hr (01/12/18 0700)  . norepinephrine (LEVOPHED) Adult infusion 11 mcg/min (01/12/18 0700)  . piperacillin-tazobactam Stopped (01/12/18 0636)  . dialysis replacement fluid (prismasate) 500 mL/hr at 01/12/18 0120  . dialysis replacement fluid (prismasate) 300 mL/hr at 01/11/18 0500  . dialysate (PRISMASATE) 2,000 mL/hr at 01/12/18 5670  . vancomycin Stopped (01/12/18 0603)  . vasopressin (PITRESSIN) infusion - *FOR SHOCK* Stopped (01/12/18 0303)    PRN Meds: sodium chloride, heparin, midazolam, ondansetron (ZOFRAN) IV, sodium chloride flush   Labs   Results for orders placed or performed during the hospital encounter of 01/09/18 (from the past 48 hour(s))  Body fluid culture     Status: None (Preliminary result)   Collection Time: 01/10/18  9:28 AM  Result Value Ref Range   Specimen Description FLUID PERICARDIAL    Special Requests C    Gram Stain      MODERATE WBC PRESENT, PREDOMINANTLY PMN NO ORGANISMS SEEN    Culture  NO GROWTH 1 DAY Performed at [facility], [place] [address] [place], [place] [address]    Report Status PENDING   I-STAT 7, (LYTES, BLD GAS, ICA, H+H)     Status: Abnormal   Collection Time: 01/10/18 10:11 AM  Result Value Ref Range   pH, Arterial 7.134 (LL) 7.350 - 7.450   pCO2 arterial 46.6 32.0 - 48.0 mmHg   pO2, Arterial 310.0 (H) 83.0 - 108.0 mmHg   Bicarbonate 15.8 (L) 20.0 - 28.0 mmol/L   TCO2 17 (L) 22 - 32 mmol/L   O2 Saturation 100.0 %   Acid-base deficit  13.0 (H) 0.0 - 2.0 mmol/L   Sodium 133 (L) 135 - 145 mmol/L   Potassium 4.2 3.5 - 5.1 mmol/L   Calcium, Ion 0.95 (L) 1.15 - 1.40 mmol/L   HCT 28.0 (L) 39.0 - 52.0 %   Hemoglobin 9.5 (L) 13.0 - 17.0 g/dL   Patient temperature 36.3 C    Sample type ARTERIAL    Comment NOTIFIED PHYSICIAN   Body fluid culture     Status: None (Preliminary result)   Collection Time: 01/10/18 10:15 AM  Result Value Ref Range   Specimen Description FLUID PERICARDIAL    Special Requests B    Gram Stain      MODERATE WBC PRESENT, PREDOMINANTLY PMN NO ORGANISMS SEEN    Culture      NO GROWTH 1 DAY Performed at [facility], [place] [address], [place], [place] [address]    Report Status PENDING   I-STAT 3, arterial blood gas (G3+)     Status: Abnormal   Collection Time: 01/10/18 10:53 AM  Result Value Ref Range   pH, Arterial 7.191 (LL) 7.350 - 7.450   pCO2 arterial 40.6 32.0 - 48.0 mmHg   pO2, Arterial 243.0 (H) 83.0 - 108.0 mmHg   Bicarbonate 15.6 (L) 20.0 - 28.0 mmol/L   TCO2 17 (L) 22 - 32 mmol/L   O2 Saturation 100.0 %   Acid-base deficit 12.0 (H) 0.0 - 2.0 mmol/L   Patient temperature 97.9 F    Collection site ARTERIAL LINE    Drawn by Operator    Sample type ARTERIAL    Comment NOTIFIED PHYSICIAN   Glucose, capillary     Status: Abnormal   Collection Time: 01/10/18 12:14 PM  Result Value Ref Range   Glucose-Capillary 185 (H) 70 - 99 mg/dL   Comment 1 Capillary Specimen   I-STAT 3, arterial blood gas (G3+)     Status: Abnormal   Collection Time: 01/10/18  1:05 PM  Result Value Ref Range   pH, Arterial 7.337 (L) 7.350 - 7.450   pCO2 arterial 23.5 (L) 32.0 - 48.0 mmHg   pO2, Arterial 109.0 (H) 83.0 - 108.0 mmHg   Bicarbonate 12.6 (L) 20.0 - 28.0 mmol/L   TCO2 13 (L) 22 - 32 mmol/L   O2 Saturation 98.0 %   Acid-base deficit 12.0 (H) 0.0 - 2.0 mmol/L   Patient temperature HIDE    Sample type ARTERIAL   Glucose, capillary     Status: Abnormal   Collection Time: 01/10/18  4:05 PM    Result Value Ref Range   Glucose-Capillary 141 (H) 70 - 99 mg/dL   Comment 1 Capillary Specimen   Glucose, capillary     Status: Abnormal   Collection Time: 01/10/18  7:54 PM  Result Value Ref Range   Glucose-Capillary 108 (H) 70 - 99 mg/dL   Comment 1 Notify RN  Glucose, capillary     Status: Abnormal   Collection Time: 01/10/18 11:17 PM  Result Value Ref Range   Glucose-Capillary 136 (H) 70 - 99 mg/dL   Comment 1 Notify RN   I-STAT, chem 8     Status: Abnormal   Collection Time: 01/10/18 11:50 PM  Result Value Ref Range   Sodium 132 (L) 135 - 145 mmol/L   Potassium 6.3 (HH) 3.5 - 5.1 mmol/L   Chloride 96 (L) 98 - 111 mmol/L   BUN 42 (H) 8 - 23 mg/dL   Creatinine, Ser 8.90 (H) 0.61 - 1.24 mg/dL   Glucose, Bld 143 (H) 70 - 99 mg/dL   Calcium, Ion 0.88 (LL) 1.15 - 1.40 mmol/L   TCO2 19 (L) 22 - 32 mmol/L   Hemoglobin 11.6 (L) 13.0 - 17.0 g/dL   HCT 34.0 (L) 39.0 - 52.0 %   Comment NOTIFIED PHYSICIAN   I-STAT 3, arterial blood gas (G3+)     Status: Abnormal   Collection Time: 01/10/18 11:59 PM  Result Value Ref Range   pH, Arterial 7.349 (L) 7.350 - 7.450   pCO2 arterial 28.2 (L) 32.0 - 48.0 mmHg   pO2, Arterial 191.0 (H) 83.0 - 108.0 mmHg   Bicarbonate 15.5 (L) 20.0 - 28.0 mmol/L   TCO2 16 (L) 22 - 32 mmol/L   O2 Saturation 100.0 %   Acid-base deficit 9.0 (H) 0.0 - 2.0 mmol/L   Patient temperature 98.9 F    Collection site ARTERIAL LINE    Drawn by Nurse    Sample type ARTERIAL   Lactic acid, plasma     Status: Abnormal   Collection Time: 01/11/18 12:05 AM  Result Value Ref Range   Lactic Acid, Venous 10.8 (HH) 0.5 - 1.9 mmol/L    Comment: CRITICAL RESULT CALLED TO, READ BACK BY AND VERIFIED WITH: VAN SWEDEN,J RN 01/11/2018 0143 JORDANS RESULTS CONFIRMED BY MANUAL DILUTION Performed at [facility], [address] [place], [place] [address]   Renal function panel     Status: Abnormal   Collection Time: 01/11/18 12:05 AM  Result Value Ref Range   Sodium  136 135 - 145 mmol/L   Potassium 6.2 (H) 3.5 - 5.1 mmol/L    Comment: DELTA CHECK NOTED   Chloride 91 (L) 98 - 111 mmol/L   CO2 16 (L) 22 - 32 mmol/L   Glucose, Bld 145 (H) 70 - 99 mg/dL   BUN 43 (H) 8 - 23 mg/dL   Creatinine, Ser 8.58 (H) 0.61 - 1.24 mg/dL   Calcium 8.4 (L) 8.9 - 10.3 mg/dL   Phosphorus 8.2 (H) 2.5 - 4.6 mg/dL   Albumin 3.1 (L) 3.5 - 5.0 g/dL   GFR calc non Af Amer 6 (L) >60 mL/min   GFR calc Af Amer 7 (L) >60 mL/min    Comment: (NOTE) The eGFR has been calculated using the CKD EPI equation. This calculation has not been validated in all clinical situations. eGFR's persistently <60 mL/min signify possible Chronic Kidney Disease.    Anion gap 29 (H) 5 - 15    Comment: RESULT CHECKED Performed at [facility], [place] [address], [place], [place] [address]   Magnesium     Status: None   Collection Time: 01/11/18 12:08 AM  Result Value Ref Range   Magnesium 2.2 1.7 - 2.4 mg/dL    Comment: Performed at [facility], [place] [address], [place], [place] [address]  Phosphorus  Status: Abnormal   Collection Time: 01/11/18 12:08 AM  Result Value Ref Range   Phosphorus 8.1 (H) 2.5 - 4.6 mg/dL    Comment: Performed at [facility] [address] [place], [place] [address]  Protime-INR     Status: Abnormal   Collection Time: 01/11/18 12:08 AM  Result Value Ref Range   Prothrombin Time 40.8 (H) 11.4 - 15.2 seconds   INR 4.28 (HH)     Comment: REPEATED TO VERIFY CRITICAL RESULT CALLED TO, READ BACK BY AND VERIFIED WITHTheodoro Grist RN [phone] SHORTT Performed at [facility] [address] [place], [place] [address] CORRECTED ON 08/13 AT 1038: PREVIOUSLY REPORTED AS 4.28 REPEATED TO VERIFY CRITICAL RESULT CALLED TO, READ BACK BY AND VERIFIED WITHTheodoro Grist RN [phone] SHORTT   Procalcitonin     Status: None   Collection Time: 01/11/18 12:08 AM  Result Value Ref Range   Procalcitonin 21.21 ng/mL    Comment:         Interpretation: PCT >= 10 ng/mL: Important systemic inflammatory response, almost exclusively due to severe bacterial sepsis or septic shock. (NOTE)       Sepsis PCT Algorithm           Lower Respiratory Tract                                      Infection PCT Algorithm    ----------------------------     ----------------------------         PCT < 0.25 ng/mL                PCT < 0.10 ng/mL         Strongly encourage             Strongly discourage   discontinuation of antibiotics    initiation of antibiotics    ----------------------------     -----------------------------       PCT 0.25 - 0.50 ng/mL            PCT 0.10 - 0.25 ng/mL               OR       >80% decrease in PCT            Discourage initiation of                                            antibiotics      Encourage discontinuation           of antibiotics    ----------------------------     -----------------------------         PCT >= 0.50 ng/mL              PCT 0.26 - 0.50 ng/mL                AND       <80% decrease in PCT             Encourage initiation of                                             antibiotics  Encourage continuation           of antibiotics    ----------------------------     -----------------------------        PCT >= 0.50 ng/mL                  PCT > 0.50 ng/mL               AND         increase in PCT                  Strongly encourage                                      initiation of antibiotics    Strongly encourage escalation           of antibiotics                                     -----------------------------                                           PCT <= 0.25 ng/mL                                                 OR                                        > 80% decrease in PCT                                     Discontinue / Do not initiate                                             antibiotics Performed at [facility], [address], [place],  [place] [address]   Glucose, capillary     Status: Abnormal   Collection Time: 01/11/18  1:24 AM  Result Value Ref Range   Glucose-Capillary 168 (H) 70 - 99 mg/dL   Comment 1 Arterial Specimen   Prepare fresh frozen plasma     Status: None   Collection Time: 01/11/18  1:41 AM  Result Value Ref Range   Unit Number [id number]    Blood Component Type THAWED PLASMA    Unit division 00    Status of Unit ISSUED,FINAL    Transfusion Status      OK TO TRANSFUSE Performed at [facility], [place] [address] [place], [place] [address]    Unit Number [id number]    Blood Component Type THAWED PLASMA    Unit division 00    Status of Unit ISSUED,FINAL    Transfusion Status OK TO TRANSFUSE   Lactic acid, plasma     Status: Abnormal   Collection Time: 01/11/18  3:05 AM  Result Value Ref Range   Lactic Acid, Venous 8.5 (HH) 0.5 - 1.9 mmol/L    Comment: CRITICAL RESULT CALLED TO, READ BACK BY AND VERIFIED WITH: Larey Seat [phone] [facility] Performed at [facility], [place] [address] [place], [place] [address]   I-STAT 3, arterial blood gas (G3+)     Status: Abnormal   Collection Time: 01/11/18  3:45 AM  Result Value Ref Range   pH, Arterial 7.406 7.350 - 7.450   pCO2 arterial 34.5 32.0 - 48.0 mmHg   pO2, Arterial 204.0 (H) 83.0 - 108.0 mmHg   Bicarbonate 21.5 20.0 - 28.0 mmol/L   TCO2 23 22 - 32 mmol/L   O2 Saturation 100.0 %   Acid-base deficit 2.0 0.0 - 2.0 mmol/L   Patient temperature 99.8 F    Collection site ARTERIAL LINE    Drawn by Nurse    Sample type ARTERIAL   Glucose, capillary     Status: Abnormal   Collection Time: 01/11/18  3:46 AM  Result Value Ref Range   Glucose-Capillary 188 (H) 70 - 99 mg/dL   Comment 1 Notify RN   CBC     Status: Abnormal   Collection Time: 01/11/18  4:12 AM  Result Value Ref Range   WBC 16.8 (H) 4.0 - 10.5 K/uL    Comment: REPEATED TO VERIFY   RBC 3.24 (L) 4.22 - 5.81 MIL/uL   Hemoglobin 9.6 (L) 13.0 - 17.0 g/dL    Comment:  REPEATED TO VERIFY   HCT 29.3 (L) 39.0 - 52.0 %   MCV 90.4 78.0 - 100.0 fL    Comment: REPEATED TO VERIFY QUESTIONABLE RESULTS, RECOMMEND RECOLLECT TO VERIFY NOTIFIED L. SHORTS, RN [phone] 01/11/2018 BY MACEDA,J. RN SAID TO RESULT AND IT WOULD BE CHECKED LATER.    MCH 29.6 26.0 - 34.0 pg   MCHC 32.8 30.0 - 36.0 g/dL   RDW 16.2 (H) 11.5 - 15.5 %   Platelets 235 150 - 400 K/uL    Comment: Performed at [facility] [address], [place], [place] [address]  Comprehensive metabolic panel     Status: Abnormal   Collection Time: 01/11/18  4:12 AM  Result Value Ref Range   Sodium 136 135 - 145 mmol/L   Potassium 6.0 (H) 3.5 - 5.1 mmol/L   Chloride 91 (L) 98 - 111 mmol/L   CO2 18 (L) 22 - 32 mmol/L   Glucose, Bld 189 (H) 70 - 99 mg/dL   BUN 40 (H) 8 - 23 mg/dL   Creatinine, Ser 8.00 (H) 0.61 - 1.24 mg/dL   Calcium 8.0 (L) 8.9 - 10.3 mg/dL   Total Protein 7.1 6.5 - 8.1 g/dL   Albumin 3.1 (L) 3.5 - 5.0 g/dL   AST 12,598 (H) 15 - 41 U/L    Comment: RESULTS CONFIRMED BY MANUAL DILUTION   ALT 7,765 (H) 0 - 44 U/L    Comment: RESULTS CONFIRMED BY MANUAL DILUTION   Alkaline Phosphatase 162 (H) 38 - 126 U/L   Total Bilirubin 3.0 (H) 0.3 - 1.2 mg/dL   GFR calc non Af Amer 6 (L) >60 mL/min   GFR calc Af Amer 7 (L) >60 mL/min    Comment: (NOTE) The eGFR has been calculated using the CKD EPI equation. This calculation has not been validated in all clinical situations. eGFR's persistently <60 mL/min signify possible Chronic Kidney Disease. CORRECTED ON 08/13 AT 0651: PREVIOUSLY REPORTED AS 7    Anion gap  27 (H) 5 - 15    Comment: RESULT CHECKED Performed at [facility], [place] [address] [place], [place] [address]   Lipase, blood     Status: Abnormal   Collection Time: 01/11/18  4:12 AM  Result Value Ref Range   Lipase 117 (H) 11 - 51 U/L    Comment: Performed at [facility], [place] [address], [place], [place] [address]  Magnesium     Status: None   Collection Time:  01/11/18  4:12 AM  Result Value Ref Range   Magnesium 2.2 1.7 - 2.4 mg/dL    Comment: Performed at [facility] [address] [place], [place] [address]  Protime-INR     Status: Abnormal   Collection Time: 01/11/18  4:12 AM  Result Value Ref Range   Prothrombin Time 34.1 (H) 11.4 - 15.2 seconds   INR 3.41     Comment: Performed at [facility] [address], [place], [place] [address]  Troponin I     Status: Abnormal   Collection Time: 01/11/18  4:12 AM  Result Value Ref Range   Troponin I 2.35 (HH) <0.03 ng/mL    Comment: CRITICAL RESULT CALLED TO, READ BACK BY AND VERIFIED WITH: Celene Skeen RN 01/11/2018 1751 JORDANS Performed at [facility], [place] [address] [place], [place] [address]   CBC     Status: Abnormal   Collection Time: 01/11/18  8:15 AM  Result Value Ref Range   WBC 16.8 (H) 4.0 - 10.5 K/uL   RBC 3.28 (L) 4.22 - 5.81 MIL/uL   Hemoglobin 9.7 (L) 13.0 - 17.0 g/dL   HCT 29.8 (L) 39.0 - 52.0 %   MCV 90.9 78.0 - 100.0 fL   MCH 29.6 26.0 - 34.0 pg   MCHC 32.6 30.0 - 36.0 g/dL   RDW 16.1 (H) 11.5 - 15.5 %   Platelets 234 150 - 400 K/uL    Comment: Performed at [facility], [place] [address], [place], [place] [address]  Renal function panel     Status: Abnormal   Collection Time: 01/11/18  8:15 AM  Result Value Ref Range   Sodium 135 135 - 145 mmol/L   Potassium 5.4 (H) 3.5 - 5.1 mmol/L   Chloride 92 (L) 98 - 111 mmol/L   CO2 22 22 - 32 mmol/L   Glucose, Bld 186 (H) 70 - 99 mg/dL   BUN 36 (H) 8 - 23 mg/dL   Creatinine, Ser 6.63 (H) 0.61 - 1.24 mg/dL   Calcium 7.9 (L) 8.9 - 10.3 mg/dL   Phosphorus 5.3 (H) 2.5 - 4.6 mg/dL   Albumin 3.2 (L) 3.5 - 5.0 g/dL   GFR calc non Af Amer 8 (L) >60 mL/min   GFR calc Af Amer 9 (L) >60 mL/min    Comment: (NOTE) The eGFR has been calculated using the CKD EPI equation. This calculation has not been validated in all clinical situations. eGFR's persistently <60 mL/min signify possible Chronic  Kidney Disease.    Anion gap 21 (H) 5 - 15    Comment: Performed at [facility], [place] [address] [place], [place] [address]  Comprehensive metabolic panel     Status: Abnormal   Collection Time: 01/11/18  8:15 AM  Result Value Ref Range   Sodium 136 135 - 145 mmol/L   Potassium 5.4 (H) 3.5 - 5.1 mmol/L   Chloride 93 (L) 98 - 111 mmol/L   CO2  22 22 - 32 mmol/L   Glucose, Bld 186 (H) 70 - 99 mg/dL   BUN 34 (H) 8 - 23 mg/dL   Creatinine, Ser 6.65 (H) 0.61 - 1.24 mg/dL   Calcium 7.8 (L) 8.9 - 10.3 mg/dL   Total Protein 7.3 6.5 - 8.1 g/dL   Albumin 3.1 (L) 3.5 - 5.0 g/dL   AST >10,000 (H) 15 - 41 U/L    Comment: RESULTS CONFIRMED BY MANUAL DILUTION CORRECTED RESULTS CALLED TO: S ADAMS,RN [phone] WILDERK CORRECTED ON 08/13 AT 1044: [place] [address] RESULTS CONFIRMED BY MANUAL DILUTION    ALT 7,786 (H) 0 - 44 U/L    Comment: RESULTS CONFIRMED BY MANUAL DILUTION   Alkaline Phosphatase 178 (H) 38 - 126 U/L   Total Bilirubin 3.2 (H) 0.3 - 1.2 mg/dL   GFR calc non Af Amer 8 (L) >60 mL/min   GFR calc Af Amer 9 (L) >60 mL/min    Comment: (NOTE) The eGFR has been calculated using the CKD EPI equation. This calculation has not been validated in all clinical situations. eGFR's persistently <60 mL/min signify possible Chronic Kidney Disease.    Anion gap 21 (H) 5 - 15    Comment: Performed at [facility], [place] [address], [place], [place] [address]  Glucose, capillary     Status: Abnormal   Collection Time: 01/11/18  8:26 AM  Result Value Ref Range   Glucose-Capillary 180 (H) 70 - 99 mg/dL   Comment 1 Capillary Specimen   I-STAT 3, arterial blood gas (G3+)     Status: Abnormal   Collection Time: 01/11/18  9:37 AM  Result Value Ref Range   pH, Arterial 7.429 7.350 - 7.450   pCO2 arterial 36.7 32.0 - 48.0 mmHg   pO2, Arterial 42.0 (L) 83.0 - 108.0 mmHg   Bicarbonate 24.3 20.0 - 28.0 mmol/L   TCO2 25 22 - 32 mmol/L   O2 Saturation 79.0 %   Patient  temperature HIDE    Sample type ARTERIAL   Glucose, capillary     Status: Abnormal   Collection Time: 01/11/18 11:34 AM  Result Value Ref Range   Glucose-Capillary 163 (H) 70 - 99 mg/dL   Comment 1 Notify RN   Glucose, capillary     Status: Abnormal   Collection Time: 01/11/18  3:43 PM  Result Value Ref Range   Glucose-Capillary 134 (H) 70 - 99 mg/dL  Renal function panel (daily at 1600)     Status: Abnormal   Collection Time: 01/11/18  3:46 PM  Result Value Ref Range   Sodium 137 135 - 145 mmol/L   Potassium 4.8 3.5 - 5.1 mmol/L   Chloride 95 (L) 98 - 111 mmol/L   CO2 23 22 - 32 mmol/L   Glucose, Bld 136 (H) 70 - 99 mg/dL   BUN 30 (H) 8 - 23 mg/dL   Creatinine, Ser 5.23 (H) 0.61 - 1.24 mg/dL   Calcium 8.1 (L) 8.9 - 10.3 mg/dL   Phosphorus 4.2 2.5 - 4.6 mg/dL   Albumin 3.1 (L) 3.5 - 5.0 g/dL   GFR calc non Af Amer 10 (L) >60 mL/min   GFR calc Af Amer 12 (L) >60 mL/min    Comment: (NOTE) The eGFR has been calculated using the CKD EPI equation. This calculation has not been validated in all clinical situations. eGFR's persistently <60 mL/min signify possible Chronic Kidney Disease.    Anion gap 19 (H) 5 - 15    Comment:  Performed at [facility], [place] [address] [place], [place] [address]  Glucose, capillary     Status: None   Collection Time: 01/11/18  7:35 PM  Result Value Ref Range   Glucose-Capillary 84 70 - 99 mg/dL  Glucose, capillary     Status: None   Collection Time: 01/11/18 11:41 PM  Result Value Ref Range   Glucose-Capillary 89 70 - 99 mg/dL   Comment 1 Capillary Specimen    Comment 2 Notify RN   CBC     Status: Abnormal   Collection Time: 01/12/18  3:46 AM  Result Value Ref Range   WBC 12.8 (H) 4.0 - 10.5 K/uL   RBC 3.10 (L) 4.22 - 5.81 MIL/uL   Hemoglobin 9.0 (L) 13.0 - 17.0 g/dL   HCT 27.4 (L) 39.0 - 52.0 %   MCV 88.4 78.0 - 100.0 fL   MCH 29.0 26.0 - 34.0 pg   MCHC 32.8 30.0 - 36.0 g/dL   RDW 16.1 (H) 11.5 - 15.5 %   Platelets 219 150 - 400  K/uL    Comment: Performed at [facility], [place]. [address] [place], [place] [address]  Comprehensive metabolic panel     Status: Abnormal (Preliminary result)   Collection Time: 01/12/18  3:46 AM  Result Value Ref Range   Sodium 137 135 - 145 mmol/L   Potassium 4.8 3.5 - 5.1 mmol/L   Chloride 98 98 - 111 mmol/L   CO2 21 (L) 22 - 32 mmol/L   Glucose, Bld 129 (H) 70 - 99 mg/dL   BUN 27 (H) 8 - 23 mg/dL   Creatinine, Ser 4.05 (H) 0.61 - 1.24 mg/dL   Calcium 8.0 (L) 8.9 - 10.3 mg/dL   Total Protein 6.8 6.5 - 8.1 g/dL   Albumin 3.0 (L) 3.5 - 5.0 g/dL   AST PENDING 15 - 41 U/L   ALT PENDING 0 - 44 U/L   Alkaline Phosphatase 196 (H) 38 - 126 U/L   Total Bilirubin 3.5 (H) 0.3 - 1.2 mg/dL   GFR calc non Af Amer 14 (L) >60 mL/min   GFR calc Af Amer 17 (L) >60 mL/min    Comment: (NOTE) The eGFR has been calculated using the CKD EPI equation. This calculation has not been validated in all clinical situations. eGFR's persistently <60 mL/min signify possible Chronic Kidney Disease.    Anion gap 18 (H) 5 - 15    Comment: Performed at [facility], [place] [address], [place], [place] [address]  Protime-INR     Status: Abnormal   Collection Time: 01/12/18  3:46 AM  Result Value Ref Range   Prothrombin Time 31.7 (H) 11.4 - 15.2 seconds   INR 3.09     Comment: Performed at [facility] [address], [place], [place] [address]  Lipase, blood     Status: None   Collection Time: 01/12/18  3:46 AM  Result Value Ref Range   Lipase 41 11 - 51 U/L    Comment: Performed at [facility], [place] [address], [place], [place] [address]  Phosphorus     Status: None   Collection Time: 01/12/18  3:46 AM  Result Value Ref Range   Phosphorus 3.5 2.5 - 4.6 mg/dL    Comment: Performed at [facility] [address] [place], [place] [address]  Glucose, capillary     Status: None   Collection Time: 01/12/18  3:55 AM  Result Value  Ref Range   Glucose-Capillary 97 70 - 99 mg/dL    Comment 1 Arterial Specimen    Comment 2 Notify RN   Glucose, capillary     Status: Abnormal   Collection Time: 01/12/18  7:40 AM  Result Value Ref Range   Glucose-Capillary 152 (H) 70 - 99 mg/dL    ECG   N/A  Telemetry   Atrial flutter with short runs of NSVT- Personally Reviewed  Radiology    Dg Chest Port 1 View  Result Date: 01/11/2018 CLINICAL DATA:  Chest tube, pericardial drain EXAM: PORTABLE CHEST 1 VIEW COMPARISON:  None. FINDINGS: Support devices are stable. Mild cardiomegaly, vascular congestion and bibasilar atelectasis, slightly improved since prior study. IMPRESSION: Improving vascular congestion and bibasilar atelectasis. Electronically Signed   By: Rolm Baptise [name]   On: 01/11/2018 07:31   Dg Chest Port 1 View  Result Date: 01/11/2018 CLINICAL DATA:  Central line placement EXAM: PORTABLE CHEST 1 VIEW COMPARISON:  01/10/2018 FINDINGS: Endotracheal tube with tip measuring 6.5 cm above the carina. Enteric tube tip is off the field of view but below the left hemidiaphragm. Left central venous catheter is in place with tip over the low SVC region. A new right central venous catheter is placed with tip over the mid SVC region. No pneumothorax. Mild cardiac enlargement. No vascular congestion. Atelectasis in the lung bases. Aortic calcification. IMPRESSION: Appliances appear in satisfactory position. Cardiac enlargement. Atelectasis in the lung bases. No pneumothorax. Electronically Signed   By: Lucienne Capers [name]   On: 01/11/2018 03:33   Dg Chest Port 1 View  Result Date: 01/10/2018 CLINICAL DATA:  Acute hypoxic respiratory failure. Shock. Pericardial effusion. EXAM: PORTABLE CHEST 1 VIEW COMPARISON:  Chest x-rays dated 01/10/2018 and CT scan of the abdomen dated 01/10/2018 FINDINGS: Endotracheal tube tip is 5.4 cm above the carina. Left central line tip is at the cavoatrial junction, unchanged. NG tube tip is in the fundus of the stomach. There is a tube at the left lung  base which may represent a tube in the pericardial space. Cardiac silhouette is prominent but unchanged. Pulmonary vascularity is normal. Persistent consolidation in the left lower lobe. Right lung is clear. IMPRESSION: 1. Persistent left lower lobe pneumonia. 2. Persistent prominence of the cardiac silhouette. There appears to be a pericardial drainage tube in place. 3. Other support apparatus appears in good position. Electronically Signed   By: Lorriane Shire [name]   On: 01/10/2018 11:29    Cardiac Studies   N/A  Assessment   Active Problems:   Hypotension   Sepsis (HCC)   Pericardial effusion with cardiac tamponade   Acute respiratory failure with hypoxia (HCC)   Septic shock (HCC)   Cholecystitis   Metabolic acidosis   Plan   1. Mr. Brase appears to be slowly clinically improving. Short run of NSVT overnight - still requiring levophed, but hopefully can wean today. Labs much improved on CRRT. Was +7L and had 1.2L negative overnight. Liver enzymes improving - given the improving clinical picture, would probably hold off on further surgery evaluation or percutaneous biliary drainage and stay the course with antibiotics, dialysis and pressor support. Noted to probably be in atrial flutter with short period of NSVT overnight. Off warfarin - note history of DVT. 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  Time Spent Directly with Patient:  I have spent a total of 35 minutes with the patient reviewing hospital notes, telemetry, EKGs, labs and examining the patient as well  as establishing an assessment and plan that was discussed personally with the patient.  > 50% of time was spent in direct patient care.  Length of Stay:  LOS: 3 days   Pixie Casino, MD, [facility], [place] Director of the [facility]  [facility] Diplomate of the American Board of Clinical  Lipidology Attending Cardiologist  Direct Dial: [phone]  Fax: [phone]  Website:  [email or web address] 01/12/2018, 8:00 AM

## 2018-01-12 NOTE — Progress Notes (Signed)
Post extubation assessment demonstrates patient with clear but delayed responses. Gazing upwards and staring off. Moving all four extremities, following commands, no focal deficit noted. Dr Lynetta Mare called discussed, no new orders at this time. Will continue to monitor.

## 2018-01-12 NOTE — Progress Notes (Signed)
RenalAssessment:  1ESRD--now on CRRT 2s/p pericardial effusion drainage 3 nonfunctioning RUE AVF, clotted(needs intervention s/p temp cath placement  4 VDRF 5 Shock on pressors 6 coagulopathy 7 pos fluid balance, improved 8 Acidosis--resolved  Plan: 1Cont CRRT support -50cc/hr and "low" K bath; will plan to stop CRRT sometime after extubation  Subjective: Interval History: Tol CRRT  Objective: Vital signs in last 24 hours: Temp:  [97.6 F (36.4 C)-99 F (37.2 C)] 97.6 F (36.4 C) (08/14 1158) Pulse Rate:  [91-115] 108 (08/14 1200) Resp:  [9-29] 16 (08/14 1200) BP: (98-144)/(44-91) 119/72 (08/14 1200) SpO2:  [99 %-100 %] 99 % (08/14 1200) Arterial Line BP: (82-143)/(41-67) 82/54 (08/14 1200) FiO2 (%):  [40 %-70 %] 40 % (08/14 1200) Weight:  [112.6 kg] 112.6 kg (08/14 0431) Weight change: -1.8 kg  Intake/Output from previous day: 08/13 0701 - 08/14 0700 In: 2080.8 [I.V.:1680.8; IV Piggyback:400] Out: 3352  Intake/Output this shift: Total I/O In: 165.2 [I.V.:116.7; NG/GT:20; IV Piggyback:28.5] Out: 536 [Emesis/NG output:50; Other:486]  General appearance: alert Head R IJ cath Resp: clear to auscultation bilaterally Chest wall: no tenderness Cardio: regular rate and rhythm, S1, S2 normal, no murmur, click, rub or gallop Extremities: edema 1-2+  Lab Results: Recent Labs    01/11/18 0815 01/12/18 0346  WBC 16.8* 12.8*  HGB 9.7* 9.0*  HCT 29.8* 27.4*  PLT 234 219   BMET:  Recent Labs    01/11/18 1546 01/12/18 0346  NA 137 137  K 4.8 4.8  CL 95* 98  CO2 23 21*  GLUCOSE 136* 129*  BUN 30* 27*  CREATININE 5.23* 4.05*  CALCIUM 8.1* 8.0*   No results for input(s): PTH in the last 72 hours. Iron Studies: No results for input(s): IRON, TIBC, TRANSFERRIN, FERRITIN in the last 72 hours. Studies/Results: Dg Chest Port 1 View  Result Date: 01/12/2018 CLINICAL DATA:  Acute respiratory failure, hypoxia EXAM: PORTABLE CHEST 1 VIEW COMPARISON:  01/11/2018  FINDINGS: Support devices are stable. Mild cardiomegaly. Left lower lobe atelectasis or infiltrate again noted, similar to prior study. No confluent opacity on the right. No effusions or acute bony abnormality. IMPRESSION: Left lower lobe atelectasis or consolidation. Mild cardiomegaly. No real change. Electronically Signed   By: Rolm Baptise [name]   On: 01/12/2018 09:49   Dg Chest Port 1 View  Result Date: 01/11/2018 CLINICAL DATA:  Chest tube, pericardial drain EXAM: PORTABLE CHEST 1 VIEW COMPARISON:  None. FINDINGS: Support devices are stable. Mild cardiomegaly, vascular congestion and bibasilar atelectasis, slightly improved since prior study. IMPRESSION: Improving vascular congestion and bibasilar atelectasis. Electronically Signed   By: Rolm Baptise [name]   On: 01/11/2018 07:31   Dg Chest Port 1 View  Result Date: 01/11/2018 CLINICAL DATA:  Central line placement EXAM: PORTABLE CHEST 1 VIEW COMPARISON:  01/10/2018 FINDINGS: Endotracheal tube with tip measuring 6.5 cm above the carina. Enteric tube tip is off the field of view but below the left hemidiaphragm. Left central venous catheter is in place with tip over the low SVC region. A new right central venous catheter is placed with tip over the mid SVC region. No pneumothorax. Mild cardiac enlargement. No vascular congestion. Atelectasis in the lung bases. Aortic calcification. IMPRESSION: Appliances appear in satisfactory position. Cardiac enlargement. Atelectasis in the lung bases. No pneumothorax. Electronically Signed   By: Lucienne Capers [name]   On: 01/11/2018 03:33    Scheduled: . chlorhexidine gluconate (MEDLINE KIT)  15 mL Mouth Rinse BID  . Chlorhexidine Gluconate Cloth  6 each Topical  Daily  . feeding supplement (VITAL HIGH PROTEIN)  1,000 mL Per Tube Q24H  . hydrocortisone sodium succinate  50 mg Intravenous Q6H  . insulin aspart  2-6 Units Subcutaneous Q4H  . mouth rinse  15 mL Mouth Rinse 10 times per day  . pantoprazole  40 mg  Intravenous Q12H  . sodium chloride flush  10-40 mL Intracatheter Q12H     LOS: 3 days   Estanislado Emms 01/12/2018,12:41 PM

## 2018-01-12 NOTE — Progress Notes (Addendum)
Daily Rounding Note  01/12/2018, 11:29 AM  LOS: 3 days   SUBJECTIVE:    Patient nods yes to question of presence of abdominal pain.  Remains intubated on pressors.  Tachycardia into the low 100s.  For the most part sustaining adequate blood pressure though latest reading 98/44.  OBJECTIVE:         Vital signs in last 24 hours:    Temp:  [97.8 F (36.6 C)-99.4 F (37.4 C)] 97.9 F (36.6 C) (08/14 0857) Pulse Rate:  [91-115] 106 (08/14 1111) Resp:  [9-29] 10 (08/14 1111) BP: (98-144)/(44-91) 98/44 (08/14 1111) SpO2:  [99 %-100 %] 99 % (08/14 1111) Arterial Line BP: (89-143)/(41-67) 90/41 (08/14 1100) FiO2 (%):  [40 %-70 %] 40 % (08/14 1111) Weight:  [112.6 kg] 112.6 kg (08/14 0431) Last BM Date: 01/07/18 Filed Weights   01/10/18 0418 01/11/18 0500 01/12/18 0431  Weight: 111.3 kg 114.4 kg 112.6 kg   General: Ill looking, obese, intubated but alert Heart: Sinus tachycardia in the low 100s. Chest: Clear to auscultation.  Some mucoid breathing sounds.  Intubated on vent. Abdomen: Somewhat distended but soft.  Not tender.  Bowel sounds not appreciated.  No tinkling or tympanitic bowel sounds. Extremities: Nonpitting minor edema in the lower extremities. Neuro/Psych: Follows commands inconsistently.  Able to move his fingers.  Intake/Output from previous day: 08/13 0701 - 08/14 0700 In: 2080.8 [I.V.:1680.8; IV Piggyback:400] Out: 3352   Intake/Output this shift: Total I/O In: 121.2 [I.V.:101.2; NG/GT:20] Out: 479 [Emesis/NG output:50; Other:429]  Lab Results: Recent Labs    01/11/18 0412 01/11/18 0815 01/12/18 0346  WBC 16.8* 16.8* 12.8*  HGB 9.6* 9.7* 9.0*  HCT 29.3* 29.8* 27.4*  PLT 235 234 219   BMET Recent Labs    01/11/18 0815 01/11/18 1546 01/12/18 0346  NA 136  135 137 137  K 5.4*  5.4* 4.8 4.8  CL 93*  92* 95* 98  CO2 _0 21*  GLUCOSE 186*  186* 136* 129*  BUN 34*  36* 30* 27*    CREATININE 6.65*  6.63* 5.23* 4.05*  CALCIUM 7.8*  7.9* 8.1* 8.0*   LFT Recent Labs    01/11/18 0412 01/11/18 0815 01/11/18 1546 01/12/18 0346  PROT 7.1 7.3  --  6.8  ALBUMIN 3.1* 3.1*  3.2* 3.1* 3.0*  AST 12,598* >10,000*  --  4,778*  ALT 7,765* 7,786*  --  5,783*  ALKPHOS 162* 178*  --  196*  BILITOT 3.0* 3.2*  --  3.5*   PT/INR Recent Labs    01/11/18 0412 01/12/18 0346  LABPROT 34.1* 31.7*  INR 3.41 3.09   Hepatitis Panel No results for input(s): HEPBSAG, HCVAB, HEPAIGM, HEPBIGM in the last 72 hours.  Studies/Results: Dg Chest Port 1 View  Result Date: 01/12/2018 CLINICAL DATA:  Acute respiratory failure, hypoxia EXAM: PORTABLE CHEST 1 VIEW COMPARISON:  01/11/2018 FINDINGS: Support devices are stable. Mild cardiomegaly. Left lower lobe atelectasis or infiltrate again noted, similar to prior study. No confluent opacity on the right. No effusions or acute bony abnormality. IMPRESSION: Left lower lobe atelectasis or consolidation. Mild cardiomegaly. No real change. Electronically Signed   By: Rolm Baptise [name]   On: 01/12/2018 09:49   Dg Chest Port 1 View  Result Date: 01/11/2018 CLINICAL DATA:  Chest tube, pericardial drain EXAM: PORTABLE CHEST 1 VIEW COMPARISON:  None. FINDINGS: Support devices are stable. Mild cardiomegaly, vascular congestion and bibasilar atelectasis, slightly improved since prior study.  IMPRESSION: Improving vascular congestion and bibasilar atelectasis. Electronically Signed   By: Rolm Baptise [name]   On: 01/11/2018 07:31   Dg Chest Port 1 View  Result Date: 01/11/2018 CLINICAL DATA:  Central line placement EXAM: PORTABLE CHEST 1 VIEW COMPARISON:  01/10/2018 FINDINGS: Endotracheal tube with tip measuring 6.5 cm above the carina. Enteric tube tip is off the field of view but below the left hemidiaphragm. Left central venous catheter is in place with tip over the low SVC region. A new right central venous catheter is placed with tip over the mid SVC  region. No pneumothorax. Mild cardiac enlargement. No vascular congestion. Atelectasis in the lung bases. Aortic calcification. IMPRESSION: Appliances appear in satisfactory position. Cardiac enlargement. Atelectasis in the lung bases. No pneumothorax. Electronically Signed   By: Lucienne Capers [name]   On: 01/11/2018 03:33   Scheduled Meds: . chlorhexidine gluconate (MEDLINE KIT)  15 mL Mouth Rinse BID  . Chlorhexidine Gluconate Cloth  6 each Topical Daily  . feeding supplement (VITAL HIGH PROTEIN)  1,000 mL Per Tube Q24H  . hydrocortisone sodium succinate  50 mg Intravenous Q6H  . insulin aspart  2-6 Units Subcutaneous Q4H  . mouth rinse  15 mL Mouth Rinse 10 times per day  . pantoprazole  40 mg Intravenous Q12H  . sodium chloride flush  10-40 mL Intracatheter Q12H   Continuous Infusions: . sodium chloride Stopped (01/12/18 0710)  . fentaNYL infusion INTRAVENOUS Stopped (01/12/18 1058)  . norepinephrine (LEVOPHED) Adult infusion 15 mcg/min (01/12/18 1138)  . piperacillin-tazobactam 3.375 g (01/12/18 1142)  . dialysis replacement fluid (prismasate) 500 mL/hr at 01/12/18 1137  . dialysis replacement fluid (prismasate) 300 mL/hr at 01/11/18 0500  . dialysate (PRISMASATE) 2,000 mL/hr at 01/12/18 1133   PRN Meds:.sodium chloride, heparin, midazolam, ondansetron (ZOFRAN) IV, sodium chloride flush  ASSESMENT:      *   Shock, suspect cardiogenic is the primary with additional septic component.  Continues to require pressors.  WBCs improved on antibiotics.   *   Cholelithiasis, question early cholecystitis. Markedly elevated LFTs suggesting shock liver.  These are improving. No surgical consultation yet on record..     *    Pancreatitis.  Question biliary versus ischemic etiology.  *    Normocytic anemia.  *    Coagulopathy.  Probably due to shock liver.  Improved but persistent after FFP and vitamin K.  *   S/P 8/12 pericardial window to address pericardial effusion.  Chest tube  remains in place.  *   ESRD.  Currently on CVVH.  *    Ischemic cardiomyopathy.  *   AFib RVR, new dx. converted 8/13.   PLAN   *  Surgical consult pending.  *    Plan MRCP in the future once he is more stable and able to participate which will probably mean he needs to be off the vent.   Azucena Freed  01/12/2018, 11:29 AM Phone [phone]    Attending physician's note   I have taken an interval history, reviewed the chart and examined the patient. I agree with the Advanced Practitioner's note, impression and recommendations. Cardiac tamponade with shock, S/P cardiac window. Shock liver, rapidly improving. Persistent coagulopathy. Mild pancreatitis, improved, ischemic vs biliary. Consider MRCP when clinically stable. Cholelithiasis, R/O cholecystitis. Surgical consult.   Lucio Edward, MD FACG [phone] office

## 2018-01-12 NOTE — Progress Notes (Signed)
PULMONARY / CRITICAL CARE MEDICINE   Name: [name] MRN: [id number] DOB: [birth year]    ADMISSION DATE:  01/09/2018 CONSULTATION DATE: 01/09/18  REFERRING MD: Transfer from [place]: Shock  HISTORY OF PRESENT ILLNESS:   64yoM with hx ESRD on HD (last HD on 01/08/18), OSA, PVD, NHL (2009), DM, HTN, GERD, Anemia, CHF (EF 20%), LE DVT (remote 46yrs ago per pt) on Coumadin, who presented to [facility] c/o "feeling bad" since dialysis on 8/10. When EMS was called, they found him to have BP 80/30 which improved to 100/30 following 500cc IVF bolus. In the OSH ER, patient again became hypotensive requiring initiation of levophed via a PIV. CXR showed a LLL infiltrate, for which he was started on Vanc and Zosyn then transferred to [facility] ICU.   On arrival, patient c/o 10/10 severe diffuse abdominal pain that has been constant x 1 day; also c/o nonbloody N/V. Denies diarrhea, CP, SOB, Cough, F/C. He is currently writhing in bed and dry heaving at time of my exam. Levophed @ 42mcg via a PIV.    SUBJECTIVE:  RN reports fentanyl decreased to 125 mcg, vasopressin off, levophed decreased to 15 mcg.  Pulling net neg 50 ml/hr with CVVHD.  Minimal drainage from pericardial drain.    VITAL SIGNS: BP (!) 108/52   Pulse (!) 101   Temp 98.3 F (36.8 C) (Oral)   Resp (!) 28   Ht 6\' 3"  (1.905 m)   Wt 112.6 kg   SpO2 100%   BMI 31.03 kg/m   HEMODYNAMICS: CVP:  [9 mmHg-14 mmHg] 10 mmHgLevophed @ 27mcg  INTAKE / OUTPUT: I/O last 3 completed shifts: In: 5397.1 [I.V.:3330.3; Blood:1355.7; IV Piggyback:711.1] Out: 9509 [Other:3828; Chest Tube:65]  PHYSICAL EXAMINATION: General: adult male in NAD, lying in bed on vent HEENT: MM pink/moist, ETT, bilateral IJ catheters c/d/i  Neuro: awake, alert, follows commands, communicates appropriately  CV: s1s2 rrr, no m/r/g PULM: even/non-labored, lungs bilaterally clear  TO:IZTI, non-tender, bsx4 active  Extremities:  warm/dry, trace edema  Skin: no rashes or lesions  LABS:  BMET Recent Labs  Lab 01/11/18 0815 01/11/18 1546 01/12/18 0346  NA 136  135 137 137  K 5.4*  5.4* 4.8 4.8  CL 93*  92* 95* 98  CO2 22  22 23  21*  BUN 34*  36* 30* 27*  CREATININE 6.65*  6.63* 5.23* 4.05*  GLUCOSE 186*  186* 136* 129*   Electrolytes Recent Labs  Lab 01/10/18 0354  01/11/18 0008 01/11/18 0412 01/11/18 0815 01/11/18 1546 01/12/18 0346  CALCIUM 8.4*   < >  --  8.0* 7.8*  7.9* 8.1* 8.0*  MG 2.2  --  2.2 2.2  --   --   --   PHOS 8.0*   < > 8.1*  --  5.3* 4.2 3.5   < > = values in this interval not displayed.   CBC Recent Labs  Lab 01/11/18 0412 01/11/18 0815 01/12/18 0346  WBC 16.8* 16.8* 12.8*  HGB 9.6* 9.7* 9.0*  HCT 29.3* 29.8* 27.4*  PLT 235 234 219   Coag's Recent Labs  Lab 01/11/18 0008 01/11/18 0412 01/12/18 0346  INR 4.28* 3.41 3.09   Sepsis Markers Recent Labs  Lab 01/09/18 1532  01/10/18 0354 01/10/18 0713 01/11/18 0005 01/11/18 0008 01/11/18 0305  LATICACIDVEN  --    < > 10.9* 12.7* 10.8*  --  8.5*  PROCALCITON 1.44  --  3.04  --   --  21.21  --    < > = values in this interval not displayed.   ABG Recent Labs  Lab 01/10/18 2359 01/11/18 0345 01/11/18 0937  PHART 7.349* 7.406 7.429  PCO2ART 28.2* 34.5 36.7  PO2ART 191.0* 204.0* 42.0*    Liver Enzymes Recent Labs  Lab 01/11/18 0412 01/11/18 0815 01/11/18 1546 01/12/18 0346  AST 12,598* >10,000*  --  4,778*  ALT 7,765* 7,786*  --  5,783*  ALKPHOS 162* 178*  --  196*  BILITOT 3.0* 3.2*  --  3.5*  ALBUMIN 3.1* 3.1*  3.2* 3.1* 3.0*   Cardiac Enzymes Recent Labs  Lab 01/09/18 2217 01/10/18 0354 01/11/18 0412  TROPONINI 0.11* 0.33* 2.35*    Glucose Recent Labs  Lab 01/11/18 1134 01/11/18 1543 01/11/18 1935 01/11/18 2341 01/12/18 0355 01/12/18 0740  GLUCAP 163* 134* 84 89 97 152*   Imaging No results found.   STUDIES:  CT Abdomen/Pelvis w IV contrast 8/12 >> cholelithiasis  with gallbladder wall thickening, pericholecystic edema suggestive of cholecystitis, infiltration in the peripancreatic fat consistent with acute pancreatitis, no loculated collections, diffuse fatty infiltration of the liver, small left pleural effusion, consolidation in the left lung base, large pericardial effusion, small periumbilical hernia containing fat  CULTURES: BCx2 8/11 >>  Sputum 8/11 >>  UA 8/11 >>  Pericardial Fluid 8/12 >>   ANTIBIOTICS: Vanc 8/11 >> 8/14 Zosyn 8/11 >>  SIGNIFICANT EVENTS: 8/11 Presented to [facility] ER with abd pain, shock, N/V >> diagnosed with pneumonia and transferred to [facility] ICU 8/12  Patient decompensated overnight, intubated > to OR for pericardial window.   8/13  Pauses in early am, CVVHD started.  Levophed 34 mcg's, vasopressin   LINES/TUBES: RUE AV fistula [facility] TLC 8/12 >>   DISCUSSION: 66yoM with hx ESRD on HD (last HD on 01/08/18), OSA, PVD, NHL (2009), DM, HTN, GERD, Anemia, CHF (EF 20%), LE DVT (remote 83yrs ago per pt) on Coumadin, who presented to [place] c/o "feeling bad" since dialysis on 8/10. When EMS was called, they found him to have BP 80/30 which improved to 100/30 following 500cc IVF bolus. In the OSH ER, patient again became hypotensive requiring initiation of levophed via a PIV. CXR showed a LLL infiltrate, for which he was started on Vanc and Zosyn then transferred to [facility] ICU.   On arrival, patient c/o 10/10 severe diffuse abdominal pain that has been constant x 1 day; also c/o nonbloody N/V. Denies diarrhea, CP, SOB, Cough, F/C. Decompensated requiring intubation.  Pericardial effusion identified > pt to OR for pericardial drain 8/12.  Had worsening acidosis / AKI requiring CVVHD.  CT ABD concerning for cholecystitis & pancreatitis with transaminitis.  CCS discussed case with Dr. Nelda Marseille (8/12) and he was felt to be too sick for surgical intervention at that time.  Abdominal process likely related to global  ischemia in the setting of tamponade.    ASSESSMENT / PLAN:  Acute Hypoxic Respiratory Failure LLL Pneumonia P: PRVC 8 cc/kg Wean PEEP / FiO2 for sats >90% SBT / WUA > ok for extubation per CVTS  ABG reviewed > vent adjustments made Follow intermittent CXR   Shock - suspect cardiogenic as primary, +/- septic component  Hemorrhagic Tamponade - INR >10 on admit AFib RVR (new diagnosis) - converted 8/13 Hx HTN and CHF (EF 20%) QT prolongation P: ICU monitoring  Wean levophed for MAP > 65.  ? 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 ? PCT reliability in a renal patient  DM P: SSI  CBG Q4  Acute Metabolic Encephalopathy P: Follow exam  Minimize fentanyl gtt as able  Early PT efforts    FAMILY  - No family available am 8/14.  Will update on arrival.     - Inter-disciplinary family meet or Palliative Care meeting due by: 01/16/18  CC Time:  30 minutes.    Noe Gens, NP-C [facility] Pgr: [phone] or if no answer [phone] 01/12/2018, 8:40 AM

## 2018-01-12 NOTE — Progress Notes (Addendum)
[facility] DAILY ICU PROGRESS NOTE                   [address]            [place] [address]          [phone]   2 Days Post-Op Procedure(s) (LRB): SUBXYPHOID PERICARDIAL WINDOW (N/A)  Total Length of Stay:  LOS: 3 days   Subjective: Intubated. Shakes head in response to questions. Eyes open but not tracking  Objective: Vital signs in last 24 hours: Temp:  [97.8 F (36.6 C)-99.4 F (37.4 C)] 98.3 F (36.8 C) (08/14 0753) Pulse Rate:  [91-110] 101 (08/14 0809) Cardiac Rhythm: Normal sinus rhythm (08/14 0730) Resp:  [23-29] 28 (08/14 0809) BP: (108-144)/(52-91) 108/52 (08/14 0809) SpO2:  [100 %] 100 % (08/14 0809) Arterial Line BP: (89-143)/(46-63) 111/55 (08/14 0800) FiO2 (%):  [50 %-70 %] 50 % (08/14 0809) Weight:  [112.6 kg] 112.6 kg (08/14 0431)  Filed Weights   01/10/18 0418 01/11/18 0500 01/12/18 0431  Weight: 111.3 kg 114.4 kg 112.6 kg    Weight change: -1.8 kg   Hemodynamic parameters for last 24 hours: CVP:  [9 mmHg-14 mmHg] 10 mmHg  Intake/Output from previous day: 08/13 0701 - 08/14 0700 In: 2080.8 [I.V.:1680.8; IV Piggyback:400] Out: 3352   Intake/Output this shift: Total I/O In: 50.4 [I.V.:30.4; NG/GT:20] Out: 121 [Other:121]  Current Meds: Scheduled Meds: . chlorhexidine gluconate (MEDLINE KIT)  15 mL Mouth Rinse BID  . Chlorhexidine Gluconate Cloth  6 each Topical Daily  . hydrocortisone sodium succinate  50 mg Intravenous Q6H  . insulin aspart  2-6 Units Subcutaneous Q4H  . mouth rinse  15 mL Mouth Rinse 10 times per day  . pantoprazole  40 mg Intravenous Q12H  . sodium chloride flush  10-40 mL Intracatheter Q12H   Continuous Infusions: . sodium chloride 10 mL/hr at 01/11/18 2000  . fentaNYL infusion INTRAVENOUS 200 mcg/hr (01/12/18 0804)  . norepinephrine (LEVOPHED) Adult infusion 11 mcg/min (01/12/18 0800)  . piperacillin-tazobactam Stopped (01/12/18 0636)  . dialysis replacement fluid (prismasate) 500 mL/hr at  01/12/18 0120  . dialysis replacement fluid (prismasate) 300 mL/hr at 01/11/18 0500  . dialysate (PRISMASATE) 2,000 mL/hr at 01/12/18 7829  . vancomycin Stopped (01/12/18 0603)  . vasopressin (PITRESSIN) infusion - *FOR SHOCK* Stopped (01/12/18 0303)   PRN Meds:.sodium chloride, heparin, midazolam, ondansetron (ZOFRAN) IV, sodium chloride flush  General appearance: alert, cooperative and no distress Heart: sinus tachycardia Lungs: coarse breath sounds Abdomen: soft Extremities: + bilateral pedal edema. 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 Wound: pericardial window incision clean and dry  Lab Results: CBC: Recent Labs    01/11/18 0815 01/12/18 0346  WBC 16.8* 12.8*  HGB 9.7* 9.0*  HCT 29.8* 27.4*  PLT 234 219   BMET:  Recent Labs    01/11/18 1546 01/12/18 0346  NA 137 137  K 4.8 4.8  CL 95* 98  CO2 23 21*  GLUCOSE 136* 129*  BUN 30* 27*  CREATININE 5.23* 4.05*  CALCIUM 8.1* 8.0*    CMET: Lab Results  Component Value Date   WBC 12.8 (H) 01/12/2018   HGB 9.0 (L) 01/12/2018   HCT 27.4 (L) 01/12/2018   PLT 219 01/12/2018   GLUCOSE 129 (H) 01/12/2018   CHOL 129 01/14/2011   TRIG 137 01/14/2011   HDL 13 (L) 01/14/2011   LDLCALC 89 01/14/2011   ALT 5,783 (H) 01/12/2018   AST 4,778 (H) 01/12/2018   NA 137 01/12/2018  K 4.8 01/12/2018   CL 98 01/12/2018   CREATININE 4.05 (H) 01/12/2018   BUN 27 (H) 01/12/2018   CO2 21 (L) 01/12/2018   TSH 0.155 (L) 01/14/2011   INR 3.09 01/12/2018   HGBA1C 8.7 (H) 05/03/2015      PT/INR:  Recent Labs    01/12/18 0346  LABPROT 31.7*  INR 3.09   Radiology: No results found.   Assessment/Plan: S/P Procedure(s) (LRB): SUBXYPHOID PERICARDIAL WINDOW (N/A)  1. CV-NSR in the 90s with an episode of afib last night. Short run of NSVT. Remains on Levophed but working on weaning today. Maps have been upper 60s-70s. Cardiology following.  2. Pulm-remains intubated/sedated. CXR reviewed-appears stable compared to yesterdays  study. Will await official read.  3. Renal-On CRRT.  Creatinine 4.05, trending down. Electrolytes okay.  4. Elevated LFTs-trending down. Will continue to trend. GI consulted 5. Hx of DVT-off warfarin, INR 3.09-likely due to liver dysfunction  6. Continue Vanc and Zosyn for LLL pneumonia and acute hypoxic respiratory failure   Plan: Possibly wean ventilator today. Keep pericardial drain until extubated-not much drainage at this time. Continue medical management per critical care.    Elgie Collard 01/12/2018 8:26 AM   I have seen and examined the patient and agree with the assessment and plan as outlined.  Okay to proceed with acute vent wean.  Not a candidate for systemic anticoagulation at this time.  Rexene Alberts, MD 01/12/2018 9:02 AM

## 2018-01-12 NOTE — Procedures (Signed)
Extubation Procedure Note  Patient Details:   Name: [name] DOB: [birth year] MRN: [id number]   Airway Documentation:   Patient extubated per order. Patient had positive cuff leak prior to extubation. Placed on 4lpm humidified oxygen. RN at bedside. Will continue to monitor. Vent end date: 01/12/18 Vent end time: 1658   Evaluation  O2 sats: stable throughout Complications: No apparent complications Patient did tolerate procedure well. Bilateral Breath Sounds: Diminished, Clear   Yes  Ander Purpura 01/12/2018, 4:58 PM

## 2018-01-12 NOTE — Progress Notes (Signed)
CT surgery  extubated with min drainage from pericardial drain BP 106/74 HR 108

## 2018-01-12 NOTE — Consult Note (Signed)
Reason for Consult:elevated lfts Referring Physician: [name] is an 67 y.o. male.  HPI: 47 yom with esrd, nonischemic CM, osa who presented with multiple issues. Apparently had abd pain but was noted to have pericardial effusion.  Has been to or for that. On initial ct scan has cholelithiasis with gbw thickening and edema. There is infiltration of peripancreatic fat.  US showed a 2.2 cm stone in gb, 8 mm wall and no murphys sign. Lipase normal.  Has increased lfts indicative of "shock liver" state.  I was called by Dr Nelda Marseille this week to ask what to do specifically if this lab picture looked like cholecystitis. The answer was no.  I was asked to see today by GI who apparently is following for lfts.   Past Medical History:  Diagnosis Date  . Anemia   . Arthritis    HNP- lumbar, "all over my body"  . Blood transfusion    "years ago; blood was low" (08/05/2013)  . CKD (chronic kidney disease) stage 4, GFR 15-29 ml/min (HCC) 03/18/2012   [place]- T,TH,Sat.  . Diabetic nephropathy ([place])   . Diabetic retinopathy   . DVT (deep venous thrombosis) ([place])    "got one in my right leg now; I've had one before too, not sure which leg" (08/05/2013)  . ESRD (end stage renal disease) on dialysis [facility])    "just started today, (08/04/2013)"  . Family history of anesthesia complication    " my son wakes up slowly"  . GERD (gastroesophageal reflux disease)    uses alka seltzere on occas.   [name])    "one q now and then" (08/05/2013)  . Hyperlipidemia   . Hypertension   . 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 Non Hodgkin's lymphoma ([place])    Tx 2009; "had chemo; it went away" (08/05/2013)  . Noncompliance 03/16/2012  . NSVT (nonsustained ventricular tachycardia) ([place]) 03/18/2012  . Peripheral vascular disease ([place])   . Pneumonia 2013   hosp.-   . Poor historian    pt. unsure of  several answers to health history questions   . Skin cancer    melanoma - head  . Sleep apnea    "suppose to have a sleep study, but they never told me when. (08/05/2013)    Past Surgical History:  Procedure Laterality Date  . ACHILLES TENDON SURGERY Right 03/13/2016   Procedure: ACHILLES LENGTHENING/KIDNER;  Surgeon: Edrick Kins, DPM;  Location: [facility];  Service: Podiatry;  Laterality: Right;  . AV FISTULA PLACEMENT Left 02/03/2013   Procedure: ARTERIOVENOUS (AV) FISTULA CREATION- LEFT RADIAL CEPHALIC; ULTRASOUND GUIDED;  Surgeon: Mal Misty, MD;  Location[facility];  Service: Vascular;  Laterality: Left;  . AV FISTULA PLACEMENT Right 11/08/2015   Procedure: RIGHT BRACHIOCEPHALIC ARTERIOVENOUS (AV) FISTULA CREATION;  Surgeon: Serafina Mitchell, MD;  Location: [facility];  Service: Vascular;  Laterality: Right;  . BASCILIC VEIN TRANSPOSITION Right 01/24/2016   Procedure: RIGHT SECOND STAGE BASILIC VEIN TRANSPOSITION;  Surgeon: Angelia Mould, MD;  Location: [facility];  Service: Vascular;  Laterality: Right;  . CARDIAC CATHETERIZATION    . COLONOSCOPY N/A 09/23/2015   Procedure: COLONOSCOPY;  Surgeon: Irene Shipper, MD;  [facility];  Service: Endoscopy;  Laterality: N/A;  . Coloscopy    . EYE SURGERY Bilateral   . GAS INSERTION  05/10/2012   Procedure: INSERTION  OF GAS;  Surgeon: Hayden Pedro, MD;  Location: [facility];  Service: Ophthalmology;  Laterality: Right;  . LEFT HEART CATH AND CORONARY ANGIOGRAPHY N/A 09/11/2016   Procedure: Left Heart Cath and Coronary Angiography;  Surgeon: Belva Crome, MD;  Location: [facility];  Service: Cardiovascular;  Laterality: N/A;  . LESION EXCISION Right 05/08/2015   Procedure: EXCISION SCALP LESION;  Surgeon: Erroll Luna, MD;  Location: [place];  Service: General;  Laterality: Right;  . MEMBRANE PEEL  05/10/2012   Procedure: MEMBRANE PEEL;  Surgeon: Hayden Pedro, MD;  Location: [facility];  Service: Ophthalmology;  Laterality: Right;  . PARS  PLANA VITRECTOMY  08/27/2011   Procedure: PARS PLANA VITRECTOMY WITH 25 GAUGE;  Surgeon: Hayden Pedro, MD;  Location: [facility];  Service: Ophthalmology;  Laterality: Left;  Repair of complex traction retinal detachment left eye  . PARS PLANA VITRECTOMY  05/10/2012   Procedure: PARS PLANA VITRECTOMY WITH 25 GAUGE;  Surgeon: Hayden Pedro, MD;  Location: [facility];  Service: Ophthalmology;  Laterality: Right;  Repair Complex Traction Retinal Detachment  . PHOTOCOAGULATION WITH LASER  05/10/2012   Procedure: PHOTOCOAGULATION WITH LASER;  Surgeon: Hayden Pedro, MD;  Location: [facility];  Service: Ophthalmology;  Laterality: Right;  . PORT-A-CATH REMOVAL    . PORTACATH PLACEMENT    . SUBXYPHOID PERICARDIAL WINDOW N/A 01/10/2018   Procedure: SUBXYPHOID PERICARDIAL WINDOW;  Surgeon: Rexene Alberts, MD;  Location: [facility] [place];  Service: Thoracic;  Laterality: N/A;  . TRANSMETATARSAL AMPUTATION Right 03/13/2016   Procedure: TRANSMETATARSAL AMPUTATION;  Surgeon: Edrick Kins, DPM;  Location: [facility];  Service: Podiatry;  Laterality: Right;  . VENA CAVA FILTER PLACEMENT  09/2012   due to preparation for surgery    Family History  Problem Relation Age of Onset  . Anesthesia problems Son   . Hypertension Son   . Diabetes Father   . Hypertension Father   . Other Father        amputation  . Colon cancer Neg Hx     Social History:  reports that he has never smoked. He has never used smokeless tobacco. He reports that he does not drink alcohol or use drugs.  Allergies: No Known Allergies  Medications: I have reviewed the patient's current medications.  Results for orders placed or performed during the hospital encounter of 01/09/18 (from the past 48 hour(s))  Glucose, capillary     Status: Abnormal   Collection Time: 01/10/18  4:05 PM  Result Value Ref Range   Glucose-Capillary 141 (H) 70 - 99 mg/dL   Comment 1 Capillary Specimen   Glucose, capillary     Status: Abnormal   Collection Time: 01/10/18  7:54  PM  Result Value Ref Range   Glucose-Capillary 108 (H) 70 - 99 mg/dL   Comment 1 Notify RN   Glucose, capillary     Status: Abnormal   Collection Time: 01/10/18 11:17 PM  Result Value Ref Range   Glucose-Capillary 136 (H) 70 - 99 mg/dL   Comment 1 Notify RN   I-STAT, chem 8     Status: Abnormal   Collection Time: 01/10/18 11:50 PM  Result Value Ref Range   Sodium 132 (L) 135 - 145 mmol/L   Potassium 6.3 (HH) 3.5 - 5.1 mmol/L   Chloride 96 (L) 98 - 111 mmol/L   BUN 42 (H) 8 - 23 mg/dL   Creatinine, Ser 8.90 (H) 0.61 - 1.24 mg/dL   Glucose, Bld 143 (  H) 70 - 99 mg/dL   Calcium, Ion 0.88 (LL) 1.15 - 1.40 mmol/L   TCO2 19 (L) 22 - 32 mmol/L   Hemoglobin 11.6 (L) 13.0 - 17.0 g/dL   HCT 34.0 (L) 39.0 - 52.0 %   Comment NOTIFIED PHYSICIAN   I-STAT 3, arterial blood gas (G3+)     Status: Abnormal   Collection Time: 01/10/18 11:59 PM  Result Value Ref Range   pH, Arterial 7.349 (L) 7.350 - 7.450   pCO2 arterial 28.2 (L) 32.0 - 48.0 mmHg   pO2, Arterial 191.0 (H) 83.0 - 108.0 mmHg   Bicarbonate 15.5 (L) 20.0 - 28.0 mmol/L   TCO2 16 (L) 22 - 32 mmol/L   O2 Saturation 100.0 %   Acid-base deficit 9.0 (H) 0.0 - 2.0 mmol/L   Patient temperature 98.9 F    Collection site ARTERIAL LINE    Drawn by Nurse    Sample type ARTERIAL   Lactic acid, plasma     Status: Abnormal   Collection Time: 01/11/18 12:05 AM  Result Value Ref Range   Lactic Acid, Venous 10.8 (HH) 0.5 - 1.9 mmol/L    Comment: CRITICAL RESULT CALLED TO, READ BACK BY AND VERIFIED WITH: VAN SWEDEN,J RN 01/11/2018 0143 JORDANS RESULTS CONFIRMED BY MANUAL DILUTION Performed at [facility], [address], [place], [place] [address]   Renal function panel     Status: Abnormal   Collection Time: 01/11/18 12:05 AM  Result Value Ref Range   Sodium 136 135 - 145 mmol/L   Potassium 6.2 (H) 3.5 - 5.1 mmol/L    Comment: DELTA CHECK NOTED   Chloride 91 (L) 98 - 111 mmol/L   CO2 16 (L) 22 - 32 mmol/L   Glucose, Bld 145 (H) 70  - 99 mg/dL   BUN 43 (H) 8 - 23 mg/dL   Creatinine, Ser 8.58 (H) 0.61 - 1.24 mg/dL   Calcium 8.4 (L) 8.9 - 10.3 mg/dL   Phosphorus 8.2 (H) 2.5 - 4.6 mg/dL   Albumin 3.1 (L) 3.5 - 5.0 g/dL   GFR calc non Af Amer 6 (L) >60 mL/min   GFR calc Af Amer 7 (L) >60 mL/min    Comment: (NOTE) The eGFR has been calculated using the CKD EPI equation. This calculation has not been validated in all clinical situations. eGFR's persistently <60 mL/min signify possible Chronic Kidney Disease.    Anion gap 29 (H) 5 - 15    Comment: RESULT CHECKED Performed at [facility], [place] [address] [place], [place] [address]   Magnesium     Status: None   Collection Time: 01/11/18 12:08 AM  Result Value Ref Range   Magnesium 2.2 1.7 - 2.4 mg/dL    Comment: Performed at [facility], [place] [address] [place], [place] [address]  Phosphorus     Status: Abnormal   Collection Time: 01/11/18 12:08 AM  Result Value Ref Range   Phosphorus 8.1 (H) 2.5 - 4.6 mg/dL    Comment: Performed at [facility] [address] [place], [place] [address]  Protime-INR     Status: Abnormal   Collection Time: 01/11/18 12:08 AM  Result Value Ref Range   Prothrombin Time 40.8 (H) 11.4 - 15.2 seconds   INR 4.28 (HH)     Comment: REPEATED TO VERIFY CRITICAL RESULT CALLED TO, READ BACK BY AND VERIFIED WITHTheodoro Grist RN [phone] SHORTT Performed at [facility] [address],  [place], [place] [address] CORRECTED ON 08/13 AT 1038: PREVIOUSLY REPORTED AS 4.28 REPEATED TO VERIFY CRITICAL RESULT CALLED TO, READ BACK BY AND VERIFIED WITHTheodoro Grist RN [phone] SHORTT   Procalcitonin     Status: None   Collection Time: 01/11/18 12:08 AM  Result Value Ref Range   Procalcitonin 21.21 ng/mL    Comment:        Interpretation: PCT >= 10 ng/mL: Important systemic inflammatory response, almost exclusively due to severe bacterial sepsis or septic shock. (NOTE)       Sepsis PCT Algorithm            Lower Respiratory Tract                                      Infection PCT Algorithm    ----------------------------     ----------------------------         PCT < 0.25 ng/mL                PCT < 0.10 ng/mL         Strongly encourage             Strongly discourage   discontinuation of antibiotics    initiation of antibiotics    ----------------------------     -----------------------------       PCT 0.25 - 0.50 ng/mL            PCT 0.10 - 0.25 ng/mL               OR       >80% decrease in PCT            Discourage initiation of                                            antibiotics      Encourage discontinuation           of antibiotics    ----------------------------     -----------------------------         PCT >= 0.50 ng/mL              PCT 0.26 - 0.50 ng/mL                AND       <80% decrease in PCT             Encourage initiation of                                             antibiotics       Encourage continuation           of antibiotics    ----------------------------     -----------------------------        PCT >= 0.50 ng/mL                  PCT > 0.50 ng/mL               AND         increase in PCT                  Strongly encourage  initiation of antibiotics    Strongly encourage escalation           of antibiotics                                     -----------------------------                                           PCT <= 0.25 ng/mL                                                 OR                                        > 80% decrease in PCT                                     Discontinue / Do not initiate                                             antibiotics Performed at [facility], [address] [place], [place] [address]   Glucose, capillary     Status: Abnormal   Collection Time: 01/11/18  1:24 AM  Result Value Ref Range   Glucose-Capillary 168 (H) 70 - 99 mg/dL   Comment 1 Arterial Specimen    Prepare fresh frozen plasma     Status: None   Collection Time: 01/11/18  1:41 AM  Result Value Ref Range   Unit Number [id number]    Blood Component Type THAWED PLASMA    Unit division 00    Status of Unit ISSUED,FINAL    Transfusion Status      OK TO TRANSFUSE Performed at [facility], [place] [address] [place], [place] [address]    Unit Number [id number]    Blood Component Type THAWED PLASMA    Unit division 00    Status of Unit ISSUED,FINAL    Transfusion Status OK TO TRANSFUSE   Lactic acid, plasma     Status: Abnormal   Collection Time: 01/11/18  3:05 AM  Result Value Ref Range   Lactic Acid, Venous 8.5 (HH) 0.5 - 1.9 mmol/L    Comment: CRITICAL RESULT CALLED TO, READ BACK BY AND VERIFIED WITH: Larey Seat [phone] [place] Performed at [facility], [place] [address], [place], [place] [address]   I-STAT 3, arterial blood gas (G3+)     Status: Abnormal   Collection Time: 01/11/18  3:45 AM  Result Value Ref Range   pH, Arterial 7.406 7.350 - 7.450   pCO2 arterial 34.5 32.0 - 48.0 mmHg   pO2, Arterial 204.0 (H) 83.0 - 108.0 mmHg   Bicarbonate 21.5 20.0 - 28.0 mmol/L   TCO2 23 22 - 32 mmol/L   O2 Saturation 100.0 %   Acid-base deficit 2.0 0.0 - 2.0 mmol/L  Patient temperature 99.8 F    Collection site ARTERIAL LINE    Drawn by Nurse    Sample type ARTERIAL   Glucose, capillary     Status: Abnormal   Collection Time: 01/11/18  3:46 AM  Result Value Ref Range   Glucose-Capillary 188 (H) 70 - 99 mg/dL   Comment 1 Notify RN   CBC     Status: Abnormal   Collection Time: 01/11/18  4:12 AM  Result Value Ref Range   WBC 16.8 (H) 4.0 - 10.5 K/uL    Comment: REPEATED TO VERIFY   RBC 3.24 (L) 4.22 - 5.81 MIL/uL   Hemoglobin 9.6 (L) 13.0 - 17.0 g/dL    Comment: REPEATED TO VERIFY   HCT 29.3 (L) 39.0 - 52.0 %   MCV 90.4 78.0 - 100.0 fL    Comment: REPEATED TO VERIFY QUESTIONABLE RESULTS, RECOMMEND RECOLLECT TO VERIFY NOTIFIED L. SHORTS, RN [phone]  01/11/2018 BY MACEDA,J. RN SAID TO RESULT AND IT WOULD BE CHECKED LATER.    MCH 29.6 26.0 - 34.0 pg   MCHC 32.8 30.0 - 36.0 g/dL   RDW 16.2 (H) 11.5 - 15.5 %   Platelets 235 150 - 400 K/uL    Comment: Performed at [facility] [address] [place], [place] [address]  Comprehensive metabolic panel     Status: Abnormal   Collection Time: 01/11/18  4:12 AM  Result Value Ref Range   Sodium 136 135 - 145 mmol/L   Potassium 6.0 (H) 3.5 - 5.1 mmol/L   Chloride 91 (L) 98 - 111 mmol/L   CO2 18 (L) 22 - 32 mmol/L   Glucose, Bld 189 (H) 70 - 99 mg/dL   BUN 40 (H) 8 - 23 mg/dL   Creatinine, Ser 8.00 (H) 0.61 - 1.24 mg/dL   Calcium 8.0 (L) 8.9 - 10.3 mg/dL   Total Protein 7.1 6.5 - 8.1 g/dL   Albumin 3.1 (L) 3.5 - 5.0 g/dL   AST 12,598 (H) 15 - 41 U/L    Comment: RESULTS CONFIRMED BY MANUAL DILUTION   ALT 7,765 (H) 0 - 44 U/L    Comment: RESULTS CONFIRMED BY MANUAL DILUTION   Alkaline Phosphatase 162 (H) 38 - 126 U/L   Total Bilirubin 3.0 (H) 0.3 - 1.2 mg/dL   GFR calc non Af Amer 6 (L) >60 mL/min   GFR calc Af Amer 7 (L) >60 mL/min    Comment: (NOTE) The eGFR has been calculated using the CKD EPI equation. This calculation has not been validated in all clinical situations. eGFR's persistently <60 mL/min signify possible Chronic Kidney Disease. CORRECTED ON 08/13 AT 0651: PREVIOUSLY REPORTED AS 7    Anion gap 27 (H) 5 - 15    Comment: RESULT CHECKED Performed at [facility], [place] [address], [place], [place] [address]   Lipase, blood     Status: Abnormal   Collection Time: 01/11/18  4:12 AM  Result Value Ref Range   Lipase 117 (H) 11 - 51 U/L    Comment: Performed at [facility], [place] [address] [place], [place] [address]  Magnesium     Status: None   Collection Time: 01/11/18  4:12 AM  Result Value Ref Range   Magnesium 2.2 1.7 - 2.4 mg/dL    Comment: Performed at [facility] [address] [place],  [address]  Protime-INR     Status:  Abnormal   Collection Time: 01/11/18  4:12 AM  Result Value Ref Range   Prothrombin Time 34.1 (H) 11.4 - 15.2 seconds   INR 3.41     Comment: Performed at [facility], [place] [address], [place], [place] [address]  Troponin I     Status: Abnormal   Collection Time: 01/11/18  4:12 AM  Result Value Ref Range   Troponin I 2.35 (HH) <0.03 ng/mL    Comment: CRITICAL RESULT CALLED TO, READ BACK BY AND VERIFIED WITH: Celene Skeen RN 01/11/2018 3846 JORDANS Performed at [facility], [place] [address] [place], [place] [address]   CBC     Status: Abnormal   Collection Time: 01/11/18  8:15 AM  Result Value Ref Range   WBC 16.8 (H) 4.0 - 10.5 K/uL   RBC 3.28 (L) 4.22 - 5.81 MIL/uL   Hemoglobin 9.7 (L) 13.0 - 17.0 g/dL   HCT 29.8 (L) 39.0 - 52.0 %   MCV 90.9 78.0 - 100.0 fL   MCH 29.6 26.0 - 34.0 pg   MCHC 32.6 30.0 - 36.0 g/dL   RDW 16.1 (H) 11.5 - 15.5 %   Platelets 234 150 - 400 K/uL    Comment: Performed at [facility], [place] [address] [place], [place] [address]  Renal function panel     Status: Abnormal   Collection Time: 01/11/18  8:15 AM  Result Value Ref Range   Sodium 135 135 - 145 mmol/L   Potassium 5.4 (H) 3.5 - 5.1 mmol/L   Chloride 92 (L) 98 - 111 mmol/L   CO2 22 22 - 32 mmol/L   Glucose, Bld 186 (H) 70 - 99 mg/dL   BUN 36 (H) 8 - 23 mg/dL   Creatinine, Ser 6.63 (H) 0.61 - 1.24 mg/dL   Calcium 7.9 (L) 8.9 - 10.3 mg/dL   Phosphorus 5.3 (H) 2.5 - 4.6 mg/dL   Albumin 3.2 (L) 3.5 - 5.0 g/dL   GFR calc non Af Amer 8 (L) >60 mL/min   GFR calc Af Amer 9 (L) >60 mL/min    Comment: (NOTE) The eGFR has been calculated using the CKD EPI equation. This calculation has not been validated in all clinical situations. eGFR's persistently <60 mL/min signify possible Chronic Kidney Disease.    Anion gap 21 (H) 5 - 15    Comment: Performed at [facility], [place] [address], [place], [place] [address]  Comprehensive metabolic panel     Status: Abnormal   Collection  Time: 01/11/18  8:15 AM  Result Value Ref Range   Sodium 136 135 - 145 mmol/L   Potassium 5.4 (H) 3.5 - 5.1 mmol/L   Chloride 93 (L) 98 - 111 mmol/L   CO2 22 22 - 32 mmol/L   Glucose, Bld 186 (H) 70 - 99 mg/dL   BUN 34 (H) 8 - 23 mg/dL   Creatinine, Ser 6.65 (H) 0.61 - 1.24 mg/dL   Calcium 7.8 (L) 8.9 - 10.3 mg/dL   Total Protein 7.3 6.5 - 8.1 g/dL   Albumin 3.1 (L) 3.5 - 5.0 g/dL   AST >10,000 (H) 15 - 41 U/L    Comment: RESULTS CONFIRMED BY MANUAL DILUTION CORRECTED RESULTS CALLED TO: S ADAMS,RN [phone] WILDERK CORRECTED ON 08/13 AT 1044: [place] [address] RESULTS CONFIRMED BY MANUAL DILUTION    ALT 7,786 (H) 0 - 44 U/L    Comment: RESULTS CONFIRMED BY MANUAL DILUTION   Alkaline Phosphatase 178 (H) 38 - 126 U/L   Total Bilirubin 3.2 (H)  0.3 - 1.2 mg/dL   GFR calc non Af Amer 8 (L) >60 mL/min   GFR calc Af Amer 9 (L) >60 mL/min    Comment: (NOTE) The eGFR has been calculated using the CKD EPI equation. This calculation has not been validated in all clinical situations. eGFR's persistently <60 mL/min signify possible Chronic Kidney Disease.    Anion gap 21 (H) 5 - 15    Comment: Performed at [facility], [place] [address] [place], [place] [address]  Glucose, capillary     Status: Abnormal   Collection Time: 01/11/18  8:26 AM  Result Value Ref Range   Glucose-Capillary 180 (H) 70 - 99 mg/dL   Comment 1 Capillary Specimen   I-STAT 3, arterial blood gas (G3+)     Status: Abnormal   Collection Time: 01/11/18  9:37 AM  Result Value Ref Range   pH, Arterial 7.429 7.350 - 7.450   pCO2 arterial 36.7 32.0 - 48.0 mmHg   pO2, Arterial 42.0 (L) 83.0 - 108.0 mmHg   Bicarbonate 24.3 20.0 - 28.0 mmol/L   TCO2 25 22 - 32 mmol/L   O2 Saturation 79.0 %   Patient temperature HIDE    Sample type ARTERIAL   Glucose, capillary     Status: Abnormal   Collection Time: 01/11/18 11:34 AM  Result Value Ref Range   Glucose-Capillary 163 (H) 70 - 99 mg/dL   Comment 1 Notify  RN   Glucose, capillary     Status: Abnormal   Collection Time: 01/11/18  3:43 PM  Result Value Ref Range   Glucose-Capillary 134 (H) 70 - 99 mg/dL  Renal function panel (daily at 1600)     Status: Abnormal   Collection Time: 01/11/18  3:46 PM  Result Value Ref Range   Sodium 137 135 - 145 mmol/L   Potassium 4.8 3.5 - 5.1 mmol/L   Chloride 95 (L) 98 - 111 mmol/L   CO2 23 22 - 32 mmol/L   Glucose, Bld 136 (H) 70 - 99 mg/dL   BUN 30 (H) 8 - 23 mg/dL   Creatinine, Ser 5.23 (H) 0.61 - 1.24 mg/dL   Calcium 8.1 (L) 8.9 - 10.3 mg/dL   Phosphorus 4.2 2.5 - 4.6 mg/dL   Albumin 3.1 (L) 3.5 - 5.0 g/dL   GFR calc non Af Amer 10 (L) >60 mL/min   GFR calc Af Amer 12 (L) >60 mL/min    Comment: (NOTE) The eGFR has been calculated using the CKD EPI equation. This calculation has not been validated in all clinical situations. eGFR's persistently <60 mL/min signify possible Chronic Kidney Disease.    Anion gap 19 (H) 5 - 15    Comment: Performed at [facility], [place] [address], [place], [place] [address]  Glucose, capillary     Status: None   Collection Time: 01/11/18  7:35 PM  Result Value Ref Range   Glucose-Capillary 84 70 - 99 mg/dL  Glucose, capillary     Status: None   Collection Time: 01/11/18 11:41 PM  Result Value Ref Range   Glucose-Capillary 89 70 - 99 mg/dL   Comment 1 Capillary Specimen    Comment 2 Notify RN   CBC     Status: Abnormal   Collection Time: 01/12/18  3:46 AM  Result Value Ref Range   WBC 12.8 (H) 4.0 - 10.5 K/uL   RBC 3.10 (L) 4.22 - 5.81 MIL/uL   Hemoglobin 9.0 (L) 13.0 - 17.0 g/dL   HCT  27.4 (L) 39.0 - 52.0 %   MCV 88.4 78.0 - 100.0 fL   MCH 29.0 26.0 - 34.0 pg   MCHC 32.8 30.0 - 36.0 g/dL   RDW 16.1 (H) 11.5 - 15.5 %   Platelets 219 150 - 400 K/uL    Comment: Performed at [facility] [address], [place], [place] [address]  Comprehensive metabolic panel     Status: Abnormal   Collection Time: 01/12/18  3:46 AM  Result Value Ref Range    Sodium 137 135 - 145 mmol/L   Potassium 4.8 3.5 - 5.1 mmol/L   Chloride 98 98 - 111 mmol/L   CO2 21 (L) 22 - 32 mmol/L   Glucose, Bld 129 (H) 70 - 99 mg/dL   BUN 27 (H) 8 - 23 mg/dL   Creatinine, Ser 4.05 (H) 0.61 - 1.24 mg/dL   Calcium 8.0 (L) 8.9 - 10.3 mg/dL   Total Protein 6.8 6.5 - 8.1 g/dL   Albumin 3.0 (L) 3.5 - 5.0 g/dL   AST 4,778 (H) 15 - 41 U/L    Comment: RESULTS CONFIRMED BY MANUAL DILUTION   ALT 5,783 (H) 0 - 44 U/L    Comment: RESULTS CONFIRMED BY MANUAL DILUTION   Alkaline Phosphatase 196 (H) 38 - 126 U/L   Total Bilirubin 3.5 (H) 0.3 - 1.2 mg/dL   GFR calc non Af Amer 14 (L) >60 mL/min   GFR calc Af Amer 17 (L) >60 mL/min    Comment: (NOTE) The eGFR has been calculated using the CKD EPI equation. This calculation has not been validated in all clinical situations. eGFR's persistently <60 mL/min signify possible Chronic Kidney Disease.    Anion gap 18 (H) 5 - 15    Comment: Performed at [facility], [place] [address], [place], [place] [address]  Protime-INR     Status: Abnormal   Collection Time: 01/12/18  3:46 AM  Result Value Ref Range   Prothrombin Time 31.7 (H) 11.4 - 15.2 seconds   INR 3.09     Comment: Performed at [facility] [address], [place], [place] [address]  Lipase, blood     Status: None   Collection Time: 01/12/18  3:46 AM  Result Value Ref Range   Lipase 41 11 - 51 U/L    Comment: Performed at [facility], [place] [address], [place], [name] [address]  Phosphorus     Status: None   Collection Time: 01/12/18  3:46 AM  Result Value Ref Range   Phosphorus 3.5 2.5 - 4.6 mg/dL    Comment: Performed at [facility] [address] [place], [place] [address]  Glucose, capillary     Status: None   Collection Time: 01/12/18  3:55 AM  Result Value Ref Range   Glucose-Capillary 97 70 - 99 mg/dL   Comment 1 Arterial Specimen    Comment 2 Notify RN   Glucose, capillary     Status: Abnormal   Collection Time: 01/12/18  7:40  AM  Result Value Ref Range   Glucose-Capillary 152 (H) 70 - 99 mg/dL  I-STAT 3, arterial blood gas (G3+)     Status: Abnormal   Collection Time: 01/12/18  8:58 AM  Result Value Ref Range   pH, Arterial 7.515 (H) 7.350 - 7.450   pCO2 arterial 34.5 32.0 - 48.0 mmHg   pO2, Arterial 223.0 (H) 83.0 - 108.0 mmHg   Bicarbonate 27.9 20.0 - 28.0 mmol/L  TCO2 29 22 - 32 mmol/L   O2 Saturation 100.0 %   Acid-Base Excess 5.0 (H) 0.0 - 2.0 mmol/L   Patient temperature 97.9 F    Sample type ARTERIAL   Glucose, capillary     Status: Abnormal   Collection Time: 01/12/18 11:31 AM  Result Value Ref Range   Glucose-Capillary 146 (H) 70 - 99 mg/dL    Dg Chest Port 1 View  Result Date: 01/12/2018 CLINICAL DATA:  Acute respiratory failure, hypoxia EXAM: PORTABLE CHEST 1 VIEW COMPARISON:  01/11/2018 FINDINGS: Support devices are stable. Mild cardiomegaly. Left lower lobe atelectasis or infiltrate again noted, similar to prior study. No confluent opacity on the right. No effusions or acute bony abnormality. IMPRESSION: Left lower lobe atelectasis or consolidation. Mild cardiomegaly. No real change. Electronically Signed   By: Rolm Baptise [name]   On: 01/12/2018 09:49   Dg Chest Port 1 View  Result Date: 01/11/2018 CLINICAL DATA:  Chest tube, pericardial drain EXAM: PORTABLE CHEST 1 VIEW COMPARISON:  None. FINDINGS: Support devices are stable. Mild cardiomegaly, vascular congestion and bibasilar atelectasis, slightly improved since prior study. IMPRESSION: Improving vascular congestion and bibasilar atelectasis. Electronically Signed   By: Rolm Baptise [name]   On: 01/11/2018 07:31   Dg Chest Port 1 View  Result Date: 01/11/2018 CLINICAL DATA:  Central line placement EXAM: PORTABLE CHEST 1 VIEW COMPARISON:  01/10/2018 FINDINGS: Endotracheal tube with tip measuring 6.5 cm above the carina. Enteric tube tip is off the field of view but below the left hemidiaphragm. Left central venous catheter is in place with tip  over the low SVC region. A new right central venous catheter is placed with tip over the mid SVC region. No pneumothorax. Mild cardiac enlargement. No vascular congestion. Atelectasis in the lung bases. Aortic calcification. IMPRESSION: Appliances appear in satisfactory position. Cardiac enlargement. Atelectasis in the lung bases. No pneumothorax. Electronically Signed   By: Lucienne Capers [name]   On: 01/11/2018 03:33    Review of Systems  Unable to perform ROS: Patient nonverbal   Blood pressure 103/68, pulse 82, temperature 97.6 F (36.4 C), temperature source Oral, resp. rate 16, height _0  (1.905 m), weight 112.6 kg, SpO2 99 %. Physical Exam  Constitutional: He appears well-developed and well-nourished.  HENT:  Head: Normocephalic and atraumatic.  Eyes: No scleral icterus.  Neck: Neck supple.  Cardiovascular: Tachycardia present.  Respiratory:  Coarse bilateral breath sounds   GI: Soft. There is no tenderness.    Assessment/Plan: Elevated lfts-indicative of more shock state than anything ,returning to normal.  GI has recommended mrcp when able Cholelithiasis- nontender abdomen, hard to tell what happened before and cannot obtain history.  Does not have cholecystitis.    Rolm Bookbinder 01/12/2018, 1:37 PM

## 2018-01-12 NOTE — Progress Notes (Signed)
Nutrition Follow-up  DOCUMENTATION CODES:   Obesity unspecified  INTERVENTION:   Tube Feeding:  Change to Vital AF 1.2 @ 20 ml/hr Goal rate: Vital AF 1.2  @ 50 ml/hr Pro-Stat 60 mL TID (6 packets) Provides 180 g of protein, 2040 kcals, 972 mL of free water Meets 100% estimated protein needs, 97% calorie needs  Recommend addition of bowel regimen given no BM since 8/9  NUTRITION DIAGNOSIS:   Inadequate oral intake related to acute illness as evidenced by NPO status.  Being addressed via nutrition support  GOAL:   Patient will meet greater than or equal to 90% of their needs  Progressing  MONITOR:   Labs, Weight trends, Vent status  REASON FOR ASSESSMENT:   Ventilator    ASSESSMENT:   67 yo male admitted with septic and cardiogenic shock, acute respiratory failure requiring vent support, pericaridal effusion taking emergently to OR. Pt with hx of ESRD on HD, DM, HTN, NICM EF 20%, GERD, HLD, PVD  Pt remains on CRRT, 50 ml/hr, low K bath Patient is currently intubated on ventilator support, fentanyl for sedation, levophed MV: 7.2 L/min Temp (24hrs), Avg:98.1 F (36.7 C), Min:97.6 F (36.4 C), Max:99 F (37.2 C)  EDW 111 kg, BMI 30, needs re-estimated  OG tube with minimal output Abdomen soft, obese, no BM since 8/9 Surgery consulted, no cholecystitis with no plans for surgical internvention GI following with plans for MRCP at some point  Labs: potassium wdl, phosphorus wdl Meds: reviewed  Diet Order:   Diet Order            Diet NPO time specified  Diet effective now              EDUCATION NEEDS:   Not appropriate for education at this time  Skin:  Skin Assessment: Skin Integrity Issues: Skin Integrity Issues:: Incisions Incisions: chest  Last BM:  8/9  Height:   Ht Readings from Last 1 Encounters:  01/09/18 6\' 3"  (1.905 m)    Weight:   Wt Readings from Last 1 Encounters:  01/12/18 112.6 kg    Ideal Body Weight:  89 kg  BMI:   Body mass index is 31.03 kg/m.  Estimated Nutritional Needs:   Kcal:  2100 kcals   Protein:  170-200 g  Fluid:  >/= 1.8 L   Kerman Passey MS, RD, LDN, CNSC [phone] Pager  [phone] Weekend/On-Call Pager

## 2018-01-13 ENCOUNTER — Inpatient Hospital Stay (HOSPITAL_COMMUNITY): Payer: Medicare Other

## 2018-01-13 DIAGNOSIS — R945 Abnormal results of liver function studies: Secondary | ICD-10-CM

## 2018-01-13 LAB — CBC
HCT: 30.1 % — ABNORMAL LOW (ref 39.0–52.0)
Hemoglobin: 9.4 g/dL — ABNORMAL LOW (ref 13.0–17.0)
MCH: 29.3 pg (ref 26.0–34.0)
MCHC: 31.2 g/dL (ref 30.0–36.0)
MCV: 93.8 fL (ref 78.0–100.0)
Platelets: 193 10*3/uL (ref 150–400)
RBC: 3.21 MIL/uL — ABNORMAL LOW (ref 4.22–5.81)
RDW: 16.5 % — ABNORMAL HIGH (ref 11.5–15.5)
WBC: 15.3 10*3/uL — ABNORMAL HIGH (ref 4.0–10.5)

## 2018-01-13 LAB — HEPATIC FUNCTION PANEL
ALT: 4017 U/L — ABNORMAL HIGH (ref 0–44)
AST: 1788 U/L — ABNORMAL HIGH (ref 15–41)
Albumin: 3 g/dL — ABNORMAL LOW (ref 3.5–5.0)
Alkaline Phosphatase: 208 U/L — ABNORMAL HIGH (ref 38–126)
Bilirubin, Direct: 2.3 mg/dL — ABNORMAL HIGH (ref 0.0–0.2)
Indirect Bilirubin: 1.6 mg/dL — ABNORMAL HIGH (ref 0.3–0.9)
Total Bilirubin: 3.9 mg/dL — ABNORMAL HIGH (ref 0.3–1.2)
Total Protein: 7 g/dL (ref 6.5–8.1)

## 2018-01-13 LAB — BASIC METABOLIC PANEL
Anion gap: 14 (ref 5–15)
BUN: 21 mg/dL (ref 8–23)
CO2: 23 mmol/L (ref 22–32)
Calcium: 8.4 mg/dL — ABNORMAL LOW (ref 8.9–10.3)
Chloride: 100 mmol/L (ref 98–111)
Creatinine, Ser: 2.8 mg/dL — ABNORMAL HIGH (ref 0.61–1.24)
GFR calc Af Amer: 26 mL/min — ABNORMAL LOW (ref >60)
GFR calc non Af Amer: 22 mL/min — ABNORMAL LOW (ref >60)
Glucose, Bld: 142 mg/dL — ABNORMAL HIGH (ref 70–99)
Potassium: 4.1 mmol/L (ref 3.5–5.1)
Sodium: 137 mmol/L (ref 135–145)

## 2018-01-13 LAB — BODY FLUID CULTURE
Culture: NO GROWTH
Culture: NO GROWTH

## 2018-01-13 LAB — PROTIME-INR
INR: 1.73
Prothrombin Time: 20.1 seconds — ABNORMAL HIGH (ref 11.4–15.2)

## 2018-01-13 LAB — RENAL FUNCTION PANEL
Albumin: 2.8 g/dL — ABNORMAL LOW (ref 3.5–5.0)
Anion gap: 13 (ref 5–15)
BUN: 21 mg/dL (ref 8–23)
CO2: 25 mmol/L (ref 22–32)
Calcium: 8.8 mg/dL — ABNORMAL LOW (ref 8.9–10.3)
Chloride: 100 mmol/L (ref 98–111)
Creatinine, Ser: 2.7 mg/dL — ABNORMAL HIGH (ref 0.61–1.24)
GFR calc Af Amer: 27 mL/min — ABNORMAL LOW (ref >60)
GFR calc non Af Amer: 23 mL/min — ABNORMAL LOW (ref >60)
Glucose, Bld: 120 mg/dL — ABNORMAL HIGH (ref 70–99)
Phosphorus: 3.4 mg/dL (ref 2.5–4.6)
Potassium: 3.9 mmol/L (ref 3.5–5.1)
Sodium: 138 mmol/L (ref 135–145)

## 2018-01-13 LAB — GLUCOSE, CAPILLARY
Glucose-Capillary: 132 mg/dL — ABNORMAL HIGH (ref 70–99)
Glucose-Capillary: 135 mg/dL — ABNORMAL HIGH (ref 70–99)
Glucose-Capillary: 108 mg/dL — ABNORMAL HIGH (ref 70–99)
Glucose-Capillary: 125 mg/dL — ABNORMAL HIGH (ref 70–99)

## 2018-01-13 LAB — LIPASE, BLOOD: Lipase: 35 U/L (ref 11–51)

## 2018-01-13 MED ORDER — HALOPERIDOL LACTATE 5 MG/ML IJ SOLN
5.0000 mg | Freq: Four times a day (QID) | INTRAMUSCULAR | Status: DC | PRN
  Administered 2018-01-13: 5 mg via INTRAVENOUS

## 2018-01-13 MED ORDER — WARFARIN SODIUM 5 MG PO TABS
5.0000 mg | ORAL_TABLET | Freq: Once | ORAL | Status: DC
  Filled 2018-01-13: qty 1

## 2018-01-13 MED ORDER — HALOPERIDOL LACTATE 5 MG/ML IJ SOLN
INTRAMUSCULAR | Status: AC
  Administered 2018-01-13: 5 mg via INTRAVENOUS
  Filled 2018-01-13: qty 1

## 2018-01-13 MED ORDER — MIDODRINE HCL 5 MG PO TABS
2.5000 mg | ORAL_TABLET | Freq: Three times a day (TID) | ORAL | Status: DC
  Administered 2018-01-13 – 2018-01-14 (×2): 2.5 mg via ORAL
  Filled 2018-01-13 (×3): qty 1

## 2018-01-13 MED ORDER — LORAZEPAM 2 MG/ML IJ SOLN
0.5000 mg | Freq: Three times a day (TID) | INTRAMUSCULAR | Status: DC | PRN
  Administered 2018-01-14 – 2018-01-15 (×2): 0.5 mg via INTRAVENOUS
  Filled 2018-01-13: qty 1

## 2018-01-13 MED ORDER — ORAL CARE MOUTH RINSE
15.0000 mL | Freq: Two times a day (BID) | OROMUCOSAL | Status: DC
  Administered 2018-01-13 – 2018-01-19 (×8): 15 mL via OROMUCOSAL

## 2018-01-13 MED ORDER — CHLORHEXIDINE GLUCONATE 0.12 % MT SOLN
15.0000 mL | Freq: Two times a day (BID) | OROMUCOSAL | Status: DC
  Administered 2018-01-13 – 2018-01-21 (×12): 15 mL via OROMUCOSAL
  Filled 2018-01-13 (×13): qty 15

## 2018-01-13 MED ORDER — FUROSEMIDE 10 MG/ML IJ SOLN: 20.0000 mg | Freq: Once | INTRAMUSCULAR | Status: DC

## 2018-01-13 NOTE — Progress Notes (Signed)
ANTICOAGULATION CONSULT NOTE - Initial Consult  Pharmacy Consult for Warfarin Management  Indication: History of DVT and pAF  No Known Allergies  Patient Measurements: Height: 6\' 3"  (190.5 cm) Weight: 238 lb 1.6 oz (108 kg) IBW/kg (Calculated) : 84.5  Vital Signs: Temp: 97.6 F (36.4 C) (08/15 0744) Temp Source: Oral (08/15 0744) BP: 110/70 (08/15 1000) Pulse Rate: 111 (08/15 1000)  Labs: Recent Labs    01/11/18 0412 01/11/18 0815  01/12/18 0346 01/12/18 1523 01/13/18 0351  HGB 9.6* 9.7*  --  9.0*  --  9.4*  HCT 29.3* 29.8*  --  27.4*  --  30.1*  PLT 235 234  --  219  --  193  LABPROT 34.1*  --   --  31.7*  --  20.1*  INR 3.41  --   --  3.09  --  1.73  CREATININE 8.00* 6.65*  6.63*   < > 4.05* 3.35* 2.80*  TROPONINI 2.35*  --   --   --   --   --    < > = values in this interval not displayed.    Estimated Creatinine Clearance: 35.4 mL/min (A) (by C-G formula based on SCr of 2.8 mg/dL (H)).   Medical History: Past Medical History:  Diagnosis Date  . Anemia   . Arthritis    HNP- lumbar, "all over my body"  . Blood transfusion    "years ago; blood was low" (08/05/2013)  . CKD (chronic kidney disease) stage 4, GFR 15-29 ml/min (HCC) 03/18/2012   [place]- T,TH,Sat.  . Diabetic nephropathy ([place])   . Diabetic retinopathy   . DVT (deep venous thrombosis) ([place])    "got one in my right leg now; I've had one before too, not sure which leg" (08/05/2013)  . ESRD (end stage renal disease) on dialysis [facility])    "just started today, (08/04/2013)"  . Family history of anesthesia complication    " my son wakes up slowly"  . GERD (gastroesophageal reflux disease)    uses alka seltzere on occas.   [name])    "one q now and then" (08/05/2013)  . Hyperlipidemia   . Hypertension   . 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 Non Hodgkin's lymphoma ([place])    Tx 2009; "had chemo;  it went away" (08/05/2013)  . Noncompliance 03/16/2012  . NSVT (nonsustained ventricular tachycardia) ([place]) 03/18/2012  . Peripheral vascular disease ([place])   . Pneumonia 2013   hosp.-   . Poor historian    pt. unsure of several answers to health history questions   . Skin cancer    melanoma - head  . Sleep apnea    "suppose to have a sleep study, but they never told me when. (08/05/2013)    Medications:  Scheduled:  . chlorhexidine  15 mL Mouth Rinse BID  . Chlorhexidine Gluconate Cloth  6 each Topical Daily  . feeding supplement (VITAL AF 1.2 CAL)  1,500 mL Per Tube Q24H  . hydrocortisone sodium succinate  50 mg Intravenous Q6H  . insulin aspart  2-6 Units Subcutaneous Q4H  . mouth rinse  15 mL Mouth Rinse q12n4p  . midodrine  2.5 mg Oral TID WC  . pantoprazole  40 mg Intravenous Q12H  . sodium chloride flush  10-40 mL Intracatheter Q12H   Infusions:  . sodium chloride Stopped (01/13/18 0659)  . norepinephrine (LEVOPHED) Adult infusion 4  mcg/min (01/13/18 1100)  . piperacillin-tazobactam Stopped (01/13/18 0640)  . dialysis replacement fluid (prismasate) 500 mL/hr at 01/13/18 1010  . dialysis replacement fluid (prismasate) 300 mL/hr at 01/13/18 1024  . dialysate (PRISMASATE) 2,000 mL/hr at 01/13/18 1003    Assessment: Pt is a 72 yoM with a history of DVT (15 years ago per pt) receiving warfarin PTA. During admission, pt developed atrial fibrillation. Home warfarin dose is 7.5 mg daily except 10 mg Mon/Thurs. Warfarin has been held since admission secondary to elevated INR, peaked at 10 has now downtrended to 1.73. Of note, patient received 10 mg [place] phytonadione and a total of 20 mg IV phytonadione on 8/13.   Will restart warfarin lower than home dose given shock liver.   Goal of Therapy:  INR 2-3 Monitor platelets by anticoagulation protocol: Yes   Plan:  Give warfarin 5 mg x 1  Daily INR Monitor LFTs Monitor for s/sx of bleeding     Claiborne Billings, PharmD PGY2  Cardiology Pharmacy Resident Phone [phone] 01/13/2018 1:45 PM

## 2018-01-13 NOTE — Progress Notes (Signed)
When this RN entered the room, patient was actively pulling out HD cath and taking off oxygen despite patient safety mitts and education on patient safety and need for lines/tubes all night.  Panchal, MD cameraed in the room. Orders to get stat chest xray to verify placement of HD cath, hold CRRT, and place patient in soft wrist restraints were given.

## 2018-01-13 NOTE — Progress Notes (Signed)
Gladys Damme, MD called and notified that chest xray is complete. Verbal orders to not pull out the rest of the HD cath line entirely, will call ground team to bedside, and continue to hold CRRT.

## 2018-01-13 NOTE — Progress Notes (Addendum)
[facility] DAILY ICU PROGRESS NOTE                   [address]            [place] [address]          [phone]   3 Days Post-Op Procedure(s) (LRB): SUBXYPHOID PERICARDIAL WINDOW (N/A)  Total Length of Stay:  LOS: 4 days   Subjective: The patient shares that his pain is well controlled. Sleepy on exam. Appears comfortable.   Objective: Vital signs in last 24 hours: Temp:  [97.5 F (36.4 C)-97.9 F (36.6 C)] 97.6 F (36.4 C) (08/15 0744) Pulse Rate:  [82-115] 108 (08/15 0700) Cardiac Rhythm: Sinus tachycardia;Atrial fibrillation (08/15 0400) Resp:  [6-28] 10 (08/15 0700) BP: (92-162)/(44-110) 116/76 (08/15 0700) SpO2:  [93 %-100 %] 93 % (08/15 0700) Arterial Line BP: (73-149)/(40-75) 99/52 (08/15 0700) FiO2 (%):  [40 %-50 %] 40 % (08/14 1539) Weight:  [671 kg] 108 kg (08/15 0412)  Filed Weights   01/11/18 0500 01/12/18 0431 01/13/18 0412  Weight: 114.4 kg 112.6 kg 108 kg    Weight change: -4.6 kg   Hemodynamic parameters for last 24 hours: CVP:  [9 mmHg-16 mmHg] 12 mmHg  Intake/Output from previous day: 08/14 0701 - 08/15 0700 In: 546.9 [I.V.:307.1; NG/GT:35.3; IV Piggyback:204.5] Out: 1729 [Emesis/NG output:100; Chest Tube:70]  Intake/Output this shift: No intake/output data recorded.  Current Meds: Scheduled Meds: . Chlorhexidine Gluconate Cloth  6 each Topical Daily  . feeding supplement (VITAL AF 1.2 CAL)  1,500 mL Per Tube Q24H  . hydrocortisone sodium succinate  50 mg Intravenous Q6H  . insulin aspart  2-6 Units Subcutaneous Q4H  . mouth rinse  15 mL Mouth Rinse BID  . pantoprazole  40 mg Intravenous Q12H  . sodium chloride flush  10-40 mL Intracatheter Q12H   Continuous Infusions: . sodium chloride Stopped (01/13/18 0659)  . norepinephrine (LEVOPHED) Adult infusion 5 mcg/min (01/13/18 0700)  . piperacillin-tazobactam Stopped (01/13/18 0640)  . dialysis replacement fluid (prismasate) Stopped (01/13/18 2458)  . dialysis replacement  fluid (prismasate) Stopped (01/13/18 0600)  . dialysate (PRISMASATE) Stopped (01/13/18 0605)   PRN Meds:.sodium chloride, fentaNYL (SUBLIMAZE) injection, heparin, ondansetron (ZOFRAN) IV, sodium chloride flush  General appearance: alert, cooperative and no distress Heart: sinus tachycardia Lungs: clear to auscultation bilaterally Abdomen: soft, non-tender; bowel sounds normal; no masses,  no organomegaly Extremities: 1-2+ pitting pedal edema. 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  Wound: clean and dry  Lab Results: CBC: Recent Labs    01/12/18 0346 01/13/18 0351  WBC 12.8* 15.3*  HGB 9.0* 9.4*  HCT 27.4* 30.1*  PLT 219 193   BMET:  Recent Labs    01/12/18 1523 01/13/18 0351  NA 138 137  K 4.3 4.1  CL 100 100  CO2 25 23  GLUCOSE 143* 142*  BUN 23 21  CREATININE 3.35* 2.80*  CALCIUM 8.5* 8.4*    CMET: Lab Results  Component Value Date   WBC 15.3 (H) 01/13/2018   HGB 9.4 (L) 01/13/2018   HCT 30.1 (L) 01/13/2018   PLT 193 01/13/2018   GLUCOSE 142 (H) 01/13/2018   CHOL 129 01/14/2011   TRIG 137 01/14/2011   HDL 13 (L) 01/14/2011   LDLCALC 89 01/14/2011   ALT 4,017 (H) 01/13/2018   AST 1,788 (H) 01/13/2018   NA 137 01/13/2018   K 4.1 01/13/2018   CL 100 01/13/2018   CREATININE 2.80 (H) 01/13/2018   BUN 21 01/13/2018  CO2 23 01/13/2018   TSH 0.155 (L) 01/14/2011   INR 1.73 01/13/2018   HGBA1C 8.7 (H) 05/03/2015      PT/INR:  Recent Labs    01/13/18 0351  LABPROT 20.1*  INR 1.73   Radiology: Dg Chest Port 1 View  Result Date: 01/13/2018 CLINICAL DATA:  Hemodialysis catheter malfunction. A new line has been placed. EXAM: PORTABLE CHEST 1 VIEW COMPARISON:  January 13, 2018 FINDINGS: The new right dialysis catheter terminates near the caval atrial junction. No pneumothorax. A left central line terminates in the central SVC. Mild edema is stable. More focal opacity and possible small effusion on the left is stable. Stable cardiomediastinal silhouette.  IMPRESSION: 1. The new right central line terminates near the caval atrial junction without pneumothorax. Left central line is stable. 2. Persistent edema. 3. Persistent focal opacity in the left base with possible small associated effusion. Electronically Signed   By: Dorise Bullion III M.D   On: 01/13/2018 07:45   Dg Chest Port 1 View  Result Date: 01/13/2018 CLINICAL DATA:  Patient pulled out central line. Check placement. EXAM: PORTABLE CHEST 1 VIEW COMPARISON:  01/12/2018 FINDINGS: Right central venous catheter has been withdrawn since the previous study. Tip is now projected over the right clavicular head, likely in the junction of the subclavian and jugular vein region. Advancement is suggested for better positioning. Left central venous catheter tip remains at the cavoatrial junction level. No pneumothorax. Shallow inspiration. Heart size and pulmonary vascularity are enlarged. Mild bilateral perihilar infiltrations may represent edema or pneumonia. Probable consolidation or atelectasis in the left lung base behind the heart. Calcification of the aorta. IMPRESSION: Right central venous catheter tip now projects over the expected location of the junction of the subclavian and jugular vein region. Persistent bilateral perihilar infiltrates. Cardiac enlargement. Consolidation or atelectasis in the left lung base behind the heart. Electronically Signed   By: Lucienne Capers [name]   On: 01/13/2018 06:25     Assessment/Plan: S/P Procedure(s) (LRB): SUBXYPHOID PERICARDIAL WINDOW (N/A)  1. CV-Sinus tachycardia with a rate in the low 100s. BP soft at times but mostly remains with MAPS in the 60s-70s. Cardiology following. Not much drainage out of pericardial tube-about 70cc in 24 hours. 2. Pulm-extubated and tolerating 3L [place]. CXR this morning shows persistent edema and a persistent focal opacity in the left base with possible small left pleural effusion.  3. Renal-On CRRT creatinine was 2.80 which is  trending down. Electrolytes okay.  4. H and H stable at 9.4/30.1, expected acute blood loss anemia 5. Endo-blood glucose well controlled on current regimen 6. LFTs trending down. GI and surgery consulted.  7. Hx of DVT-not a candidate for systemic anticoagulation at this time. INR today is 1.73 8. Continue Vanc and Zosyn for LLL pneumonia and acute hypoxic respiratory failure  Plan: Discontinue pericardial drain today. Continue medical management per critical care.   Elgie Collard 01/13/2018 8:03 AM   I have seen and examined the patient and agree with the assessment and plan as outlined.  D/C pericardial tube.  Rexene Alberts, MD 01/13/2018 8:35 AM

## 2018-01-13 NOTE — Procedures (Signed)
Procedure Note - Temporary Hemodialysis Catheter Change Over Guidewire BERK PILOT  [id number] 1951-03-21  Procedure: Temporary hemodialysis catheter change over guidewire Indications: Malpositioning/withdrawal of temporary hemodialysis catheter  Procedure Details Consent: Not obtained; changed over emergent conditions. Time Out: Verified patient identification, verified procedure, site/side was marked, verified correct patient position, special equipment/implants available, medications/allergies/relevent history reviewed, required imaging and test results available.  Performed  Maximum sterile technique was used including antiseptics, cap, gloves, gown, hand hygiene, mask and sheet. Skin prep: Chlorhexidine; local anesthetic administered  The previously positioned (now malpositioned) and retracted hemodialysis catheter was sterilized along with the surrounding skin.  Sterile field was prepared in the standard fashion.  Drape was applied to expose a sufficient portion of the retracted hemodialysis catheter along with the percutaneous insertion site.  The malpositioned catheter was cut with surgical scissors.  The lumens of the catheter were occluded externally (pinched) guidewire was introduced through the central lumen and into the right internal jugular vein.  The malpositioned catheter was retracted over guidewire.  New temporary hemodialysis catheter was introduced over guidewire and advanced to 20 cm.  Sutured in place.  Biopatch applied.  Dressing applied.  Evaluation Blood flow good Complications: No apparent complications Patient did tolerate procedure well. Chest X-ray ordered to verify placement.  CXR: normal (no pneumothorax) with the dialysis catheter tip positioned in the right atrium.  Renee Pain, MD Board Certified by the ABIM, Pulmonary Diseases & Critical Care Medicine  01/13/2018 7:32 AM

## 2018-01-13 NOTE — Progress Notes (Signed)
eLink Physician-Brief Progress Note Patient Name: [name] DOB: [birth year] MRN: [id number]   Date of Service  01/13/2018  HPI/Events of Note  Extubation today. Ongoing delirium. h/o Prolonged Qtc, mid 66s. Positional / malfunctioning arterial line. On low dose NEpi.  Discussed with nursing.     eICU Interventions  Continue low dose fentanyl prn D/c haldol Ordered low dose prn 0.5mg  ativan      Intervention Category Minor Interventions: Agitation / anxiety - evaluation and management  [name] [name] 01/13/2018, 7:50 PM

## 2018-01-13 NOTE — Progress Notes (Signed)
RenalAssessment:  1ESRD--now on CRRT 2s/p pericardial effusion drainage 3 RUE AVF now functioning with improved BP 4 VDRF 5 Shockon pressors, improving 6 coagulopathy 7 pos fluid balance, improved 8 Acidosis--resolved  Plan: 1Cont CRRT support -50cc/hr and "low" K bath; will plan to stop CRRT within next 24 to 48 hrs if stable hemodynamics  Subjective: Interval History:  Extubated   Objective: Vital signs in last 24 hours: Temp:  [97.5 F (36.4 C)-97.9 F (36.6 C)] 97.6 F (36.4 C) (08/15 0744) Pulse Rate:  [82-115] 110 (08/15 0900) Resp:  [6-22] 9 (08/15 0900) BP: (92-162)/(44-110) 95/67 (08/15 0800) SpO2:  [93 %-100 %] 94 % (08/15 0900) Arterial Line BP: (73-149)/(40-75) 98/45 (08/15 0900) FiO2 (%):  [40 %] 40 % (08/14 1539) Weight:  [268 kg] 108 kg (08/15 0412) Weight change: -4.6 kg  Intake/Output from previous day: 08/14 0701 - 08/15 0700 In: 546.9 [I.V.:307.1; NG/GT:35.3; IV Piggyback:204.5] Out: 1729 [Emesis/NG output:100; Chest Tube:70] Intake/Output this shift: Total I/O In: 4.7 [I.V.:4.7] Out: 0   General appearance: alert and slowed mentation Head: Normocephalic, without obvious abnormality, atraumatic Resp: clear to auscultation bilaterally Chest wall: Drainage tube AVF ok  Lab Results: Recent Labs    01/12/18 0346 01/13/18 0351  WBC 12.8* 15.3*  HGB 9.0* 9.4*  HCT 27.4* 30.1*  PLT 219 193   BMET:  Recent Labs    01/12/18 1523 01/13/18 0351  NA 138 137  K 4.3 4.1  CL 100 100  CO2 25 23  GLUCOSE 143* 142*  BUN 23 21  CREATININE 3.35* 2.80*  CALCIUM 8.5* 8.4*   No results for input(s): PTH in the last 72 hours. Iron Studies: No results for input(s): IRON, TIBC, TRANSFERRIN, FERRITIN in the last 72 hours. Studies/Results: Dg Chest Port 1 View  Result Date: 01/13/2018 CLINICAL DATA:  Hemodialysis catheter malfunction. A new line has been placed. EXAM: PORTABLE CHEST 1 VIEW COMPARISON:  January 13, 2018 FINDINGS: The new right  dialysis catheter terminates near the caval atrial junction. No pneumothorax. A left central line terminates in the central SVC. Mild edema is stable. More focal opacity and possible small effusion on the left is stable. Stable cardiomediastinal silhouette. IMPRESSION: 1. The new right central line terminates near the caval atrial junction without pneumothorax. Left central line is stable. 2. Persistent edema. 3. Persistent focal opacity in the left base with possible small associated effusion. Electronically Signed   By: Dorise Bullion III M.D   On: 01/13/2018 07:45   Dg Chest Port 1 View  Result Date: 01/13/2018 CLINICAL DATA:  Patient pulled out central line. Check placement. EXAM: PORTABLE CHEST 1 VIEW COMPARISON:  01/12/2018 FINDINGS: Right central venous catheter has been withdrawn since the previous study. Tip is now projected over the right clavicular head, likely in the junction of the subclavian and jugular vein region. Advancement is suggested for better positioning. Left central venous catheter tip remains at the cavoatrial junction level. No pneumothorax. Shallow inspiration. Heart size and pulmonary vascularity are enlarged. Mild bilateral perihilar infiltrations may represent edema or pneumonia. Probable consolidation or atelectasis in the left lung base behind the heart. Calcification of the aorta. IMPRESSION: Right central venous catheter tip now projects over the expected location of the junction of the subclavian and jugular vein region. Persistent bilateral perihilar infiltrates. Cardiac enlargement. Consolidation or atelectasis in the left lung base behind the heart. Electronically Signed   By: Lucienne Capers [name]   On: 01/13/2018 06:25   Dg Chest [facility] 1 View  Result  Date: 01/12/2018 CLINICAL DATA:  Acute respiratory failure, hypoxia EXAM: PORTABLE CHEST 1 VIEW COMPARISON:  01/11/2018 FINDINGS: Support devices are stable. Mild cardiomegaly. Left lower lobe atelectasis or infiltrate  again noted, similar to prior study. No confluent opacity on the right. No effusions or acute bony abnormality. IMPRESSION: Left lower lobe atelectasis or consolidation. Mild cardiomegaly. No real change. Electronically Signed   By: Rolm Baptise [name]   On: 01/12/2018 09:49    Scheduled: . Chlorhexidine Gluconate Cloth  6 each Topical Daily  . feeding supplement (VITAL AF 1.2 CAL)  1,500 mL Per Tube Q24H  . hydrocortisone sodium succinate  50 mg Intravenous Q6H  . insulin aspart  2-6 Units Subcutaneous Q4H  . mouth rinse  15 mL Mouth Rinse BID  . pantoprazole  40 mg Intravenous Q12H  . sodium chloride flush  10-40 mL Intracatheter Q12H      LOS: 4 days   Estanislado Emms 01/13/2018,9:01 AM

## 2018-01-13 NOTE — Progress Notes (Signed)
Verbal order to remove Arterial line per Icard, MD.

## 2018-01-13 NOTE — Progress Notes (Addendum)
Patient swallowing water from straw with no evidence of aspiration risk.  Clear liquids ordered.  Patient also able to communicate needs and understand that he needs to not pull any lines out.  Restraints taken off and mits placed for now.    RN will continue to monitor patient.

## 2018-01-13 NOTE — Progress Notes (Signed)
Patient became suddenly combative and trying to get out of bed.  He was not redirectable or able to be reasoned with.  He was saying he was going to get out of the bed. He thought he was at home.  CCM came to bedside. Haldol given and patient restrained for his own and staffing safety.    During this time, the CRRT machine clotted off.  Nephro was called and CRRT stopped. Plan to transition to HD. HD catheter heparin locked with 1.4cc of heparin per port order.    RN will continue to monitor.

## 2018-01-13 NOTE — Progress Notes (Signed)
eLink Physician-Brief Progress Note Patient Name: [name] DOB: 07-Jan-1951 MRN: [id number]   Date of Service  01/13/2018  HPI/Events of Note  Patient pulled his HD catheter out  eICU Interventions  Restraints , check CXR for positioning, until then hold dialysis     Intervention Category Minor Interventions: Agitation / anxiety - evaluation and management  Sharia Reeve 01/13/2018, 6:07 AM

## 2018-01-13 NOTE — Progress Notes (Signed)
Patient attempting to get out of bed and not aware of where he is at the moment. Warren Lacy, RN (Abby) cameraed in the room. Patient given lots of education. Patient continued to try to get out of bed. 12.5 mcg of Fentanyl given.

## 2018-01-13 NOTE — Progress Notes (Signed)
Progress Note   Subjective  Extubated. Improving   Objective  Vital signs in last 24 hours: Temp:  [97.5 F (36.4 C)-97.9 F (36.6 C)] 97.6 F (36.4 C) (08/15 0744) Pulse Rate:  [91-113] 108 (08/15 1400) Resp:  [6-22] 11 (08/15 1400) BP: (90-162)/(55-110) 94/66 (08/15 1400) SpO2:  [92 %-100 %] 92 % (08/15 1400) Arterial Line BP: (73-149)/(40-75) 82/48 (08/15 1400) FiO2 (%):  [40 %] 40 % (08/14 1539) Weight:  [350 kg] 108 kg (08/15 0412) Last BM Date: 01/07/18  General: Alert, well-developed, in NAD Heart:  Regular rate and rhythm; no murmurs Chest: Clear to ascultation bilaterally Abdomen:  Soft, nontender and nondistended. Normal bowel sounds, without guarding, and without rebound.   Extremities:  Without edema. Neurologic:  Alert and  oriented x4; grossly normal neurologically. Psych:  Alert and cooperative. Normal mood and affect.  Intake/Output from previous day: 08/14 0701 - 08/15 0700 In: 546.9 [I.V.:307.1; NG/GT:35.3; IV Piggyback:204.5] Out: 1729 [Emesis/NG output:100; Chest Tube:70] Intake/Output this shift: Total I/O In: 134.6 [P.O.:50; I.V.:34.6; IV Piggyback:50] Out: 307 [Other:277; Chest Tube:30]  Lab Results: Recent Labs    01/11/18 0815 01/12/18 0346 01/13/18 0351  WBC 16.8* 12.8* 15.3*  HGB 9.7* 9.0* 9.4*  HCT 29.8* 27.4* 30.1*  PLT 234 219 193   BMET Recent Labs    01/12/18 0346 01/12/18 1523 01/13/18 0351  NA 137 138 137  K 4.8 4.3 4.1  CL 98 100 100  CO2 21* 25 23  GLUCOSE 129* 143* 142*  BUN 27* 23 21  CREATININE 4.05* 3.35* 2.80*  CALCIUM 8.0* 8.5* 8.4*   LFT Recent Labs    01/13/18 0351  PROT 7.0  ALBUMIN 3.0*  AST 1,788*  ALT 4,017*  ALKPHOS 208*  BILITOT 3.9*  BILIDIR 2.3*  IBILI 1.6*   PT/INR Recent Labs    01/12/18 0346 01/13/18 0351  LABPROT 31.7* 20.1*  INR 3.09 1.73   Hepatitis Panel No results for input(s): HEPBSAG, HCVAB, HEPAIGM, HEPBIGM in the last 72 hours.  Studies/Results: Dg Chest Port 1  View  Result Date: 01/13/2018 CLINICAL DATA:  Hemodialysis catheter malfunction. A new line has been placed. EXAM: PORTABLE CHEST 1 VIEW COMPARISON:  January 13, 2018 FINDINGS: The new right dialysis catheter terminates near the caval atrial junction. No pneumothorax. A left central line terminates in the central SVC. Mild edema is stable. More focal opacity and possible small effusion on the left is stable. Stable cardiomediastinal silhouette. IMPRESSION: 1. The new right central line terminates near the caval atrial junction without pneumothorax. Left central line is stable. 2. Persistent edema. 3. Persistent focal opacity in the left base with possible small associated effusion. Electronically Signed   By: Dorise Bullion III M.D   On: 01/13/2018 07:45   Dg Chest Port 1 View  Result Date: 01/13/2018 CLINICAL DATA:  Patient pulled out central line. Check placement. EXAM: PORTABLE CHEST 1 VIEW COMPARISON:  01/12/2018 FINDINGS: Right central venous catheter has been withdrawn since the previous study. Tip is now projected over the right clavicular head, likely in the junction of the subclavian and jugular vein region. Advancement is suggested for better positioning. Left central venous catheter tip remains at the cavoatrial junction level. No pneumothorax. Shallow inspiration. Heart size and pulmonary vascularity are enlarged. Mild bilateral perihilar infiltrations may represent edema or pneumonia. Probable consolidation or atelectasis in the left lung base behind the heart. Calcification of the aorta. IMPRESSION: Right central venous catheter tip now projects over the expected location of the  junction of the subclavian and jugular vein region. Persistent bilateral perihilar infiltrates. Cardiac enlargement. Consolidation or atelectasis in the left lung base behind the heart. Electronically Signed   By: Lucienne Capers [name]   On: 01/13/2018 06:25   Dg Chest Port 1 View  Result Date: 01/12/2018 CLINICAL  DATA:  Acute respiratory failure, hypoxia EXAM: PORTABLE CHEST 1 VIEW COMPARISON:  01/11/2018 FINDINGS: Support devices are stable. Mild cardiomegaly. Left lower lobe atelectasis or infiltrate again noted, similar to prior study. No confluent opacity on the right. No effusions or acute bony abnormality. IMPRESSION: Left lower lobe atelectasis or consolidation. Mild cardiomegaly. No real change. Electronically Signed   By: Rolm Baptise [name]   On: 01/12/2018 09:49      Assessment & Plan   1. Cardiac tamponade, post cardiac window.   2. Shock liver, improving. Could take several weeks for LFTs to return to baseline. Trend LFTs.  3. Cholelithiasis with GB wall thickening. Surgery does not feel there is evidence of cholecystis.   4. Mild pancreatitis, resolved. Likely ischemic. MRCP prior to discharge, after he has further improved, to exclude choledocholithiasis.   GI signing off. Please call us if further assistance is needed.     LOS: 4 days    Norberto Sorenson T. Fuller Plan MD 01/13/2018, 2:02 PM

## 2018-01-13 NOTE — Progress Notes (Signed)
Patient attempting to pull lines and gown. Patient oriented to time, place, and self at this time. Patient verbalizes that he is in the hospital, but still attempts to pull at lines and state, "I am ready to get up and go home." This RN reoriented patient multiple times and placed patient safety mitts.

## 2018-01-13 NOTE — Progress Notes (Signed)
DAILY PROGRESS NOTE   Patient Name: [name] Date of Encounter: 01/13/2018  Chief Complaint   No abdominal pain  Patient Profile   67 yo male with shock, found to have a uremic pericardial effusion and tamponade, s/p pericardial window, now with ongoing hypotension and known systolic CHF - EF 80%.  Subjective   Extubated - labs improving. EKG shows sinus tach with underlying RBBB. Creatinine improving with CVVH. Liver enzymes improving. No suspicion for cholecystitis by surgery. INR subtherapeutic today at 1.73.   Objective   Vitals:   01/13/18 0744 01/13/18 0800 01/13/18 0900 01/13/18 1000  BP:  95/67 103/69 110/70  Pulse:  (!) 107 (!) 110 (!) 111  Resp:  (!) 9 (!) 9 10  Temp: 97.6 F (36.4 C)     TempSrc: Oral     SpO2:  99% 94% 92%  Weight:      Height:        Intake/Output Summary (Last 24 hours) at 01/13/2018 1039 Last data filed at 01/13/2018 1000 Gross per 24 hour  Intake 466.83 ml  Output 1438 ml  Net -971.17 ml   Filed Weights   01/11/18 0500 01/12/18 0431 01/13/18 0412  Weight: 114.4 kg 112.6 kg 108 kg    Physical Exam   General appearance: alert, no distress and extubated, appears comfortable Neck: no carotid bruit, no JVD and thyroid not enlarged, symmetric, no tenderness/mass/nodules Lungs: diminished breath sounds bibasilar Heart: regular rate and rhythm and tachycardic Abdomen: soft, non-tender; bowel sounds normal; no masses,  no organomegaly Extremities: extremities normal, atraumatic, no cyanosis or edema Pulses: 2+ and symmetric Skin: Skin color, texture, turgor normal. No rashes or lesions Neurologic: Mental status: sleepy, but follows commands Psych: Pleasant  Inpatient Medications    Scheduled Meds: . chlorhexidine  15 mL Mouth Rinse BID  . Chlorhexidine Gluconate Cloth  6 each Topical Daily  . feeding supplement (VITAL AF 1.2 CAL)  1,500 mL Per Tube Q24H  . hydrocortisone sodium succinate  50 mg Intravenous Q6H  . insulin  aspart  2-6 Units Subcutaneous Q4H  . mouth rinse  15 mL Mouth Rinse q12n4p  . pantoprazole  40 mg Intravenous Q12H  . sodium chloride flush  10-40 mL Intracatheter Q12H    Continuous Infusions: . sodium chloride Stopped (01/13/18 0659)  . norepinephrine (LEVOPHED) Adult infusion 7 mcg/min (01/13/18 1000)  . piperacillin-tazobactam Stopped (01/13/18 0640)  . dialysis replacement fluid (prismasate) 500 mL/hr at 01/13/18 1010  . dialysis replacement fluid (prismasate) 300 mL/hr at 01/13/18 1024  . dialysate (PRISMASATE) 2,000 mL/hr at 01/13/18 1003    PRN Meds: sodium chloride, fentaNYL (SUBLIMAZE) injection, heparin, ondansetron (ZOFRAN) IV, sodium chloride flush   Labs   Results for orders placed or performed during the hospital encounter of 01/09/18 (from the past 48 hour(s))  Glucose, capillary     Status: Abnormal   Collection Time: 01/11/18 11:34 AM  Result Value Ref Range   Glucose-Capillary 163 (H) 70 - 99 mg/dL   Comment 1 Notify RN   Glucose, capillary     Status: Abnormal   Collection Time: 01/11/18  3:43 PM  Result Value Ref Range   Glucose-Capillary 134 (H) 70 - 99 mg/dL  Renal function panel (daily at 1600)     Status: Abnormal   Collection Time: 01/11/18  3:46 PM  Result Value Ref Range   Sodium 137 135 - 145 mmol/L   Potassium 4.8 3.5 - 5.1 mmol/L   Chloride 95 (L) 98 - 111  mmol/L   CO2 23 22 - 32 mmol/L   Glucose, Bld 136 (H) 70 - 99 mg/dL   BUN 30 (H) 8 - 23 mg/dL   Creatinine, Ser 5.23 (H) 0.61 - 1.24 mg/dL   Calcium 8.1 (L) 8.9 - 10.3 mg/dL   Phosphorus 4.2 2.5 - 4.6 mg/dL   Albumin 3.1 (L) 3.5 - 5.0 g/dL   GFR calc non Af Amer 10 (L) >60 mL/min   GFR calc Af Amer 12 (L) >60 mL/min    Comment: (NOTE) The eGFR has been calculated using the CKD EPI equation. This calculation has not been validated in all clinical situations. eGFR's persistently <60 mL/min signify possible Chronic Kidney Disease.    Anion gap 19 (H) 5 - 15    Comment: Performed at  [facility], [place] [address] [place], [place] [address]  Glucose, capillary     Status: None   Collection Time: 01/11/18  7:35 PM  Result Value Ref Range   Glucose-Capillary 84 70 - 99 mg/dL  Glucose, capillary     Status: None   Collection Time: 01/11/18 11:41 PM  Result Value Ref Range   Glucose-Capillary 89 70 - 99 mg/dL   Comment 1 Capillary Specimen    Comment 2 Notify RN   CBC     Status: Abnormal   Collection Time: 01/12/18  3:46 AM  Result Value Ref Range   WBC 12.8 (H) 4.0 - 10.5 K/uL   RBC 3.10 (L) 4.22 - 5.81 MIL/uL   Hemoglobin 9.0 (L) 13.0 - 17.0 g/dL   HCT 27.4 (L) 39.0 - 52.0 %   MCV 88.4 78.0 - 100.0 fL   MCH 29.0 26.0 - 34.0 pg   MCHC 32.8 30.0 - 36.0 g/dL   RDW 16.1 (H) 11.5 - 15.5 %   Platelets 219 150 - 400 K/uL    Comment: Performed at [facility], [place]. [address], [place], [place] [address]  Comprehensive metabolic panel     Status: Abnormal   Collection Time: 01/12/18  3:46 AM  Result Value Ref Range   Sodium 137 135 - 145 mmol/L   Potassium 4.8 3.5 - 5.1 mmol/L   Chloride 98 98 - 111 mmol/L   CO2 21 (L) 22 - 32 mmol/L   Glucose, Bld 129 (H) 70 - 99 mg/dL   BUN 27 (H) 8 - 23 mg/dL   Creatinine, Ser 4.05 (H) 0.61 - 1.24 mg/dL   Calcium 8.0 (L) 8.9 - 10.3 mg/dL   Total Protein 6.8 6.5 - 8.1 g/dL   Albumin 3.0 (L) 3.5 - 5.0 g/dL   AST 4,778 (H) 15 - 41 U/L    Comment: RESULTS CONFIRMED BY MANUAL DILUTION   ALT 5,783 (H) 0 - 44 U/L    Comment: RESULTS CONFIRMED BY MANUAL DILUTION   Alkaline Phosphatase 196 (H) 38 - 126 U/L   Total Bilirubin 3.5 (H) 0.3 - 1.2 mg/dL   GFR calc non Af Amer 14 (L) >60 mL/min   GFR calc Af Amer 17 (L) >60 mL/min    Comment: (NOTE) The eGFR has been calculated using the CKD EPI equation. This calculation has not been validated in all clinical situations. eGFR's persistently <60 mL/min signify possible Chronic Kidney Disease.    Anion gap 18 (H) 5 - 15    Comment: Performed at [facility], [place] [address], [place], [place] [address]  Protime-INR     Status: Abnormal   Collection Time: 01/12/18  3:46 AM  Result Value Ref Range   Prothrombin Time 31.7 (H) 11.4 - 15.2 seconds   INR 3.09     Comment: Performed at [facility] [address], [place], [place] [address]  Lipase, blood     Status: None   Collection Time: 01/12/18  3:46 AM  Result Value Ref Range   Lipase 41 11 - 51 U/L    Comment: Performed at [facility], [place] [address], [place], [place] [address]  Phosphorus     Status: None   Collection Time: 01/12/18  3:46 AM  Result Value Ref Range   Phosphorus 3.5 2.5 - 4.6 mg/dL    Comment: Performed at [facility] [address], [place], [place] [address]  Glucose, capillary     Status: None   Collection Time: 01/12/18  3:55 AM  Result Value Ref Range   Glucose-Capillary 97 70 - 99 mg/dL   Comment 1 Arterial Specimen    Comment 2 Notify RN   Glucose, capillary     Status: Abnormal   Collection Time: 01/12/18  7:40 AM  Result Value Ref Range   Glucose-Capillary 152 (H) 70 - 99 mg/dL  I-STAT 3, arterial blood gas (G3+)     Status: Abnormal   Collection Time: 01/12/18  8:58 AM  Result Value Ref Range   pH, Arterial 7.515 (H) 7.350 - 7.450   pCO2 arterial 34.5 32.0 - 48.0 mmHg   pO2, Arterial 223.0 (H) 83.0 - 108.0 mmHg   Bicarbonate 27.9 20.0 - 28.0 mmol/L   TCO2 29 22 - 32 mmol/L   O2 Saturation 100.0 %   Acid-Base Excess 5.0 (H) 0.0 - 2.0 mmol/L   Patient temperature 97.9 F    Sample type ARTERIAL   Glucose, capillary     Status: Abnormal   Collection Time: 01/12/18 11:31 AM  Result Value Ref Range   Glucose-Capillary 146 (H) 70 - 99 mg/dL  Renal function panel (daily at 1600)     Status: Abnormal   Collection Time: 01/12/18  3:23 PM  Result Value Ref Range   Sodium 138 135 - 145 mmol/L   Potassium 4.3 3.5 - 5.1 mmol/L   Chloride 100 98 - 111 mmol/L   CO2 25 22 - 32 mmol/L   Glucose, Bld 143 (H) 70 - 99 mg/dL   BUN 23 8 - 23 mg/dL    Creatinine, Ser 3.35 (H) 0.61 - 1.24 mg/dL   Calcium 8.5 (L) 8.9 - 10.3 mg/dL   Phosphorus 4.1 2.5 - 4.6 mg/dL   Albumin 3.0 (L) 3.5 - 5.0 g/dL   GFR calc non Af Amer 18 (L) >60 mL/min   GFR calc Af Amer 21 (L) >60 mL/min    Comment: (NOTE) The eGFR has been calculated using the CKD EPI equation. This calculation has not been validated in all clinical situations. eGFR's persistently <60 mL/min signify possible Chronic Kidney Disease.    Anion gap 13 5 - 15    Comment: Performed at [facility] [address] [place], [place] [address]  Glucose, capillary     Status: Abnormal   Collection Time: 01/12/18  3:23 PM  Result Value Ref Range   Glucose-Capillary 142 (H) 70 - 99 mg/dL  Glucose, capillary     Status: Abnormal   Collection Time: 01/12/18  7:45 PM  Result Value Ref Range   Glucose-Capillary 111 (H) 70 - 99 mg/dL   Comment 1 Notify RN  Glucose, capillary     Status: Abnormal   Collection Time: 01/12/18 11:46 PM  Result Value Ref Range   Glucose-Capillary 121 (H) 70 - 99 mg/dL   Comment 1 Notify RN   Glucose, capillary     Status: Abnormal   Collection Time: 01/13/18  3:46 AM  Result Value Ref Range   Glucose-Capillary 132 (H) 70 - 99 mg/dL   Comment 1 Notify RN   CBC     Status: Abnormal   Collection Time: 01/13/18  3:51 AM  Result Value Ref Range   WBC 15.3 (H) 4.0 - 10.5 K/uL   RBC 3.21 (L) 4.22 - 5.81 MIL/uL   Hemoglobin 9.4 (L) 13.0 - 17.0 g/dL   HCT 30.1 (L) 39.0 - 52.0 %   MCV 93.8 78.0 - 100.0 fL   MCH 29.3 26.0 - 34.0 pg   MCHC 31.2 30.0 - 36.0 g/dL   RDW 16.5 (H) 11.5 - 15.5 %   Platelets 193 150 - 400 K/uL    Comment: Performed at [facility], [place]. [address] [place], [place] [address]  Protime-INR     Status: Abnormal   Collection Time: 01/13/18  3:51 AM  Result Value Ref Range   Prothrombin Time 20.1 (H) 11.4 - 15.2 seconds   INR 1.73     Comment: Performed at [facility] [address], [place], [place] [address]  Hepatic  function panel     Status: Abnormal   Collection Time: 01/13/18  3:51 AM  Result Value Ref Range   Total Protein 7.0 6.5 - 8.1 g/dL   Albumin 3.0 (L) 3.5 - 5.0 g/dL   AST 1,788 (H) 15 - 41 U/L   ALT 4,017 (H) 0 - 44 U/L    Comment: RESULTS CONFIRMED BY MANUAL DILUTION   Alkaline Phosphatase 208 (H) 38 - 126 U/L   Total Bilirubin 3.9 (H) 0.3 - 1.2 mg/dL   Bilirubin, Direct 2.3 (H) 0.0 - 0.2 mg/dL   Indirect Bilirubin 1.6 (H) 0.3 - 0.9 mg/dL    Comment: Performed at [facility] [address], [place], [place] [address]  Lipase, blood     Status: None   Collection Time: 01/13/18  3:51 AM  Result Value Ref Range   Lipase 35 11 - 51 U/L    Comment: Performed at [facility], [place] [address] [place], [place] [address]  Basic metabolic panel     Status: Abnormal   Collection Time: 01/13/18  3:51 AM  Result Value Ref Range   Sodium 137 135 - 145 mmol/L   Potassium 4.1 3.5 - 5.1 mmol/L   Chloride 100 98 - 111 mmol/L   CO2 23 22 - 32 mmol/L   Glucose, Bld 142 (H) 70 - 99 mg/dL   BUN 21 8 - 23 mg/dL   Creatinine, Ser 2.80 (H) 0.61 - 1.24 mg/dL   Calcium 8.4 (L) 8.9 - 10.3 mg/dL   GFR calc non Af Amer 22 (L) >60 mL/min   GFR calc Af Amer 26 (L) >60 mL/min    Comment: (NOTE) The eGFR has been calculated using the CKD EPI equation. This calculation has not been validated in all clinical situations. eGFR's persistently <60 mL/min signify possible Chronic Kidney Disease.    Anion gap 14 5 - 15    Comment: Performed at [facility] [address], [place], [place] [address]  Glucose, capillary     Status: Abnormal   Collection  Time: 01/13/18  7:49 AM  Result Value Ref Range   Glucose-Capillary 135 (H) 70 - 99 mg/dL   Comment 1 Capillary Specimen     ECG   N/A  Telemetry   Sinus tach with RBBB- Personally Reviewed  Radiology    Dg Chest Port 1 View  Result Date: 01/13/2018 CLINICAL DATA:  Hemodialysis catheter malfunction. A new line has been placed.  EXAM: PORTABLE CHEST 1 VIEW COMPARISON:  January 13, 2018 FINDINGS: The new right dialysis catheter terminates near the caval atrial junction. No pneumothorax. A left central line terminates in the central SVC. Mild edema is stable. More focal opacity and possible small effusion on the left is stable. Stable cardiomediastinal silhouette. IMPRESSION: 1. The new right central line terminates near the caval atrial junction without pneumothorax. Left central line is stable. 2. Persistent edema. 3. Persistent focal opacity in the left base with possible small associated effusion. Electronically Signed   By: Dorise Bullion III M.D   On: 01/13/2018 07:45   Dg Chest Port 1 View  Result Date: 01/13/2018 CLINICAL DATA:  Patient pulled out central line. Check placement. EXAM: PORTABLE CHEST 1 VIEW COMPARISON:  01/12/2018 FINDINGS: Right central venous catheter has been withdrawn since the previous study. Tip is now projected over the right clavicular head, likely in the junction of the subclavian and jugular vein region. Advancement is suggested for better positioning. Left central venous catheter tip remains at the cavoatrial junction level. No pneumothorax. Shallow inspiration. Heart size and pulmonary vascularity are enlarged. Mild bilateral perihilar infiltrations may represent edema or pneumonia. Probable consolidation or atelectasis in the left lung base behind the heart. Calcification of the aorta. IMPRESSION: Right central venous catheter tip now projects over the expected location of the junction of the subclavian and jugular vein region. Persistent bilateral perihilar infiltrates. Cardiac enlargement. Consolidation or atelectasis in the left lung base behind the heart. Electronically Signed   By: Lucienne Capers [name]   On: 01/13/2018 06:25   Dg Chest Port 1 View  Result Date: 01/12/2018 CLINICAL DATA:  Acute respiratory failure, hypoxia EXAM: PORTABLE CHEST 1 VIEW COMPARISON:  01/11/2018 FINDINGS: Support  devices are stable. Mild cardiomegaly. Left lower lobe atelectasis or infiltrate again noted, similar to prior study. No confluent opacity on the right. No effusions or acute bony abnormality. IMPRESSION: Left lower lobe atelectasis or consolidation. Mild cardiomegaly. No real change. Electronically Signed   By: Rolm Baptise [name]   On: 01/12/2018 09:49    Cardiac Studies   N/A  Assessment   Active Problems:   Hypotension   Sepsis (HCC)   Pericardial effusion with cardiac tamponade   Acute respiratory failure with hypoxia (HCC)   Septic shock (HCC)   Cholecystitis   Metabolic acidosis   Elevated LFTs   Idiopathic acute pancreatitis without infection or necrosis   Plan   Mr. Fraticelli is improving, now extubated. Pericardial drain removed. Will repeat limited echo tomorrow for pericardial effusion. INR now subtherapeutic - hepatic injury improving. Would restart warfarin without heparin bridge for PAF and history of DVT.  Time Spent Directly with Patient:  I have spent a total of 25 minutes with the patient reviewing hospital notes, telemetry, EKGs, labs and examining the patient as well as establishing an assessment and plan that was discussed personally with the patient.  > 50% of time was spent in direct patient care.  Length of Stay:  LOS: 4 days   Pixie Casino, MD, [facility], [place]  [facility] Diplomate of the American Board of Clinical Lipidology Attending Cardiologist  Direct Dial: [phone]  Fax: [phone]  Website:  [email or web address] 01/13/2018, 10:39 AM

## 2018-01-13 NOTE — Progress Notes (Signed)
PULMONARY / CRITICAL CARE MEDICINE   Name: [name] MRN: [id number] DOB: 11/24/1950    ADMISSION DATE:  01/09/2018 CONSULTATION DATE: 01/09/18  REFERRING MD: Transfer from [place]: Shock  HISTORY OF PRESENT ILLNESS:   3yoM with hx ESRD on HD (last HD on 01/08/18), OSA, PVD, NHL (2009), DM, HTN, GERD, Anemia, CHF (EF 20%), LE DVT (remote 69yrs ago per pt) on Coumadin, who presented to [place] c/o "feeling bad" since dialysis on 8/10. When EMS was called, they found him to have BP 80/30 which improved to 100/30 following 500cc IVF bolus. In the OSH ER, patient again became hypotensive requiring initiation of levophed via a PIV. CXR showed a LLL infiltrate, for which he was started on Vanc and Zosyn then transferred to [facility] ICU.   On arrival, patient c/o 10/10 severe diffuse abdominal pain that has been constant x 1 day; also c/o nonbloody N/V. Denies diarrhea, CP, SOB, Cough, F/C. He is currently writhing in bed and dry heaving at time of my exam. Levophed @ 66mcg via a PIV.    SUBJECTIVE:  Continuing to wean pressors - down to levophed 56mcg Pulled HD cath nearly out overnight - replaced over wire but CVVHD was off for a bit  Pericardial drain out    VITAL SIGNS: BP 103/69   Pulse (!) 110   Temp 97.6 F (36.4 C) (Oral)   Resp (!) 9   Ht 6\' 3"  (1.905 m)   Wt 108 kg   SpO2 94%   BMI 29.76 kg/m   HEMODYNAMICS: CVP:  [9 mmHg-16 mmHg] 12 mmHgLevophed @ 8mcg  INTAKE / OUTPUT: I/O last 3 completed shifts: In: 1471.4 [I.V.:931.6; NG/GT:35.3; IV Piggyback:504.5] Out: 8250 [Emesis/NG output:100; NLZJQ:7341; Chest Tube:70]  PHYSICAL EXAMINATION: General:  Chronically ill-appearing male, no acute distress in bed HEENT: MM pink/moist Neuro: Awake, alert, follows commands, somewhat confused CV: s1s2 rrr, no m/r/g, pericardial drain out, dressing clean and dry PULM: even/non-labored nasal cannula, lungs bilaterally few scattered  rhonchi PF:XTKW, non-tender, bsx4 active  Extremities: warm/dry, plus BLE edema  Skin: no rashes or lesions   LABS:  BMET Recent Labs  Lab 01/12/18 0346 01/12/18 1523 01/13/18 0351  NA 137 138 137  K 4.8 4.3 4.1  CL 98 100 100  CO2 21* 25 23  BUN 27* 23 21  CREATININE 4.05* 3.35* 2.80*  GLUCOSE 129* 143* 142*   Electrolytes Recent Labs  Lab 01/10/18 0354  01/11/18 0008 01/11/18 0412  01/11/18 1546 01/12/18 0346 01/12/18 1523 01/13/18 0351  CALCIUM 8.4*   < >  --  8.0*   < > 8.1* 8.0* 8.5* 8.4*  MG 2.2  --  2.2 2.2  --   --   --   --   --   PHOS 8.0*   < > 8.1*  --    < > 4.2 3.5 4.1  --    < > = values in this interval not displayed.   CBC Recent Labs  Lab 01/11/18 0815 01/12/18 0346 01/13/18 0351  WBC 16.8* 12.8* 15.3*  HGB 9.7* 9.0* 9.4*  HCT 29.8* 27.4* 30.1*  PLT 234 219 193   Coag's Recent Labs  Lab 01/11/18 0412 01/12/18 0346 01/13/18 0351  INR 3.41 3.09 1.73   Sepsis Markers Recent Labs  Lab 01/09/18 1532  01/10/18 0354 01/10/18 0713 01/11/18 0005 01/11/18 0008 01/11/18 0305  LATICACIDVEN  --    < > 10.9* 12.7* 10.8*  --  8.5*  PROCALCITON 1.44  --  3.04  --   --  21.21  --    < > = values in this interval not displayed.   ABG Recent Labs  Lab 01/11/18 0345 01/11/18 0937 01/12/18 0858  PHART 7.406 7.429 7.515*  PCO2ART 34.5 36.7 34.5  PO2ART 204.0* 42.0* 223.0*    Liver Enzymes Recent Labs  Lab 01/11/18 0815  01/12/18 0346 01/12/18 1523 01/13/18 0351  AST >10,000*  --  4,778*  --  1,788*  ALT 7,786*  --  5,783*  --  4,017*  ALKPHOS 178*  --  196*  --  208*  BILITOT 3.2*  --  3.5*  --  3.9*  ALBUMIN 3.1*  3.2*   < > 3.0* 3.0* 3.0*   < > = values in this interval not displayed.   Cardiac Enzymes Recent Labs  Lab 01/09/18 2217 01/10/18 0354 01/11/18 0412  TROPONINI 0.11* 0.33* 2.35*    Glucose Recent Labs  Lab 01/12/18 1131 01/12/18 1523 01/12/18 1945 01/12/18 2346 01/13/18 0346 01/13/18 0749  GLUCAP  146* 142* 111* 121* 132* 135*   Imaging Dg Chest Port 1 View  Result Date: 01/13/2018 CLINICAL DATA:  Hemodialysis catheter malfunction. A new line has been placed. EXAM: PORTABLE CHEST 1 VIEW COMPARISON:  January 13, 2018 FINDINGS: The new right dialysis catheter terminates near the caval atrial junction. No pneumothorax. A left central line terminates in the central SVC. Mild edema is stable. More focal opacity and possible small effusion on the left is stable. Stable cardiomediastinal silhouette. IMPRESSION: 1. The new right central line terminates near the caval atrial junction without pneumothorax. Left central line is stable. 2. Persistent edema. 3. Persistent focal opacity in the left base with possible small associated effusion. Electronically Signed   By: Dorise Bullion III M.D   On: 01/13/2018 07:45   Dg Chest Port 1 View  Result Date: 01/13/2018 CLINICAL DATA:  Patient pulled out central line. Check placement. EXAM: PORTABLE CHEST 1 VIEW COMPARISON:  01/12/2018 FINDINGS: Right central venous catheter has been withdrawn since the previous study. Tip is now projected over the right clavicular head, likely in the junction of the subclavian and jugular vein region. Advancement is suggested for better positioning. Left central venous catheter tip remains at the cavoatrial junction level. No pneumothorax. Shallow inspiration. Heart size and pulmonary vascularity are enlarged. Mild bilateral perihilar infiltrations may represent edema or pneumonia. Probable consolidation or atelectasis in the left lung base behind the heart. Calcification of the aorta. IMPRESSION: Right central venous catheter tip now projects over the expected location of the junction of the subclavian and jugular vein region. Persistent bilateral perihilar infiltrates. Cardiac enlargement. Consolidation or atelectasis in the left lung base behind the heart. Electronically Signed   By: Lucienne Capers [name]   On: 01/13/2018 06:25      STUDIES:  CT Abdomen/Pelvis w IV contrast 8/12 >> cholelithiasis with gallbladder wall thickening, pericholecystic edema suggestive of cholecystitis, infiltration in the peripancreatic fat consistent with acute pancreatitis, no loculated collections, diffuse fatty infiltration of the liver, small left pleural effusion, consolidation in the left lung base, large pericardial effusion, small periumbilical hernia containing fat  CULTURES: BCx2 8/11 >>  Sputum 8/11 >>  UA 8/11 >>  Pericardial Fluid 8/12 >>   ANTIBIOTICS: Vanc 8/11 >> 8/14 Zosyn 8/11 >>  SIGNIFICANT EVENTS: 8/11 Presented to [facility] ER with abd pain, shock, N/V >> diagnosed with pneumonia and transferred to [facility] ICU 8/12  Patient decompensated  overnight, intubated > to OR for pericardial window.   8/13  Pauses in early am, CVVHD started.  Levophed 34 mcg's, vasopressin   LINES/TUBES: RUE AV fistula LIJ TLC 8/12 >> R IJ HD cath 8/13>>>  DISCUSSION: 66yoM with hx ESRD on HD (last HD on 01/08/18), OSA, PVD, NHL (2009), DM, HTN, GERD, Anemia, CHF (EF 20%), LE DVT (remote 29yrs ago per pt) on Coumadin, who presented to [place] c/o "feeling bad" since dialysis on 8/10. When EMS was called, they found him to have BP 80/30 which improved to 100/30 following 500cc IVF bolus. In the OSH ER, patient again became hypotensive requiring initiation of levophed via a PIV. CXR showed a LLL infiltrate, for which he was started on Vanc and Zosyn then transferred to [facility] ICU.   On arrival, patient c/o 10/10 severe diffuse abdominal pain that has been constant x 1 day; also c/o nonbloody N/V. Denies diarrhea, CP, SOB, Cough, F/C. Decompensated requiring intubation.  Pericardial effusion identified > pt to OR for pericardial drain 8/12.  Had worsening acidosis / AKI requiring CVVHD.  CT ABD concerning for cholecystitis & pancreatitis with transaminitis.  CCS discussed case with Dr. Nelda Marseille (8/12) and he was felt to be  too sick for surgical intervention at that time.  Abdominal process likely related to global ischemia in the setting of tamponade.    ASSESSMENT / PLAN:  Acute Hypoxic Respiratory Failure-improved.  Extubated 8/14 LLL Pneumonia P: Pulmonary hygiene Mobilize as able Follow-up chest x-ray Supplemental O2 as needed to keep sats >90%   Shock - suspect cardiogenic as primary, +/- septic component  Hemorrhagic Tamponade - INR >10 on admit -now S/P pericardial window 8/12 AFib RVR (new diagnosis) - converted 8/13 Hx HTN and CHF (EF 20%) QT prolongation P: Continue ICU monitoring Continue to wean pressors as able for map > 65 Continue stress steroids Monitor CVP   ESRD on HD AGMA / Lactic Acidosis  P:  Appreciate renal assistance Continue CVVHD per renal with goal -50 cc/h Follow-up chemistry   Abdominal Pain  Nausea / Vomiting  Cholelithiasis -  Pancreatitis  Shock Liver  P: We will start clear liquids Appreciate GI, surgery input- likely needs MRCP when able Trend LFTs   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 Coagulopathy Anemia P:  follow-up CBC, INR SCDs   Shock - concern for sepsis, due to LLL Pneumonia +/- Intra-abdominal infxn but this may have all been ischemia from tamponade / poor perfusion in a renal patient with decreased LVEF at baseline P: Continue Zosyn, D6/X ABX Follow cultures-negative to date Trend procalcitonin although ?PCT reliability in renal patient   DM P: SSI  CBG Q4  Acute Metabolic Encephalopathy - improving slowly P: Follow exam  Minimize fentanyl gtt as able  Early PT efforts    FAMILY  - No family available am 8/15 on NP rounds      - Inter-disciplinary family meet or Palliative Care meeting due by: 01/16/18  CC Time:  32 minutes.    Nickolas Madrid, NP 01/13/2018  9:16 AM Pager: [phone] or [phone]

## 2018-01-13 NOTE — Progress Notes (Signed)
PT Cancellation Note  Patient Details Name: [name] MRN: [id number] DOB: 06-05-1950   Cancelled Treatment:    Reason Eval/Treat Not Completed: Patient not medically ready;Patient at procedure or test/unavailable   Reinaldo Berber 01/13/2018, 9:17 AM

## 2018-01-13 NOTE — Progress Notes (Signed)
Patient ID: [name], male   DOB: 1951/01/31, 67 y.o.   MRN: [id number] No abd pain, nontender abdomen lfts mostly improving Gi has mrcp pending- we were not consulted formally earlier this week, I was asked question by ccm I dont think needs anything with gb, not sure if he has ever had symptoms from this or if all of this is result of this critical illness

## 2018-01-13 NOTE — Care Management Note (Signed)
Case Management Note Marvetta Gibbons RN,BSN Unit 2H 1-22 - RN Care Coordinator (Case Management) [phone]  Patient Details  Name: [name] MRN: [id number] Date of Birth: Apr 16, 1951  Subjective/Objective:  Pt admitted with hypotension, found to have uremic pericardial effusion and tamponade, s/p pericardial window  Now extubated              Action/Plan: PTA pt lived at home, PT eval pending for recommendations- CM to follow for transition of care needs  Expected Discharge Date:                  Expected Discharge Plan:  Home/Self Care  In-House Referral:     Discharge planning Services  CM Consult  Post Acute Care Choice:    Choice offered to:     DME Arranged:    DME Agency:     HH Arranged:    [facility] Agency:     Status of Service:  In process, will continue to follow  If discussed at Long Length of Stay Meetings, dates discussed:   Discharge Disposition:    Additional Comments:  Dawayne Patricia, RN 01/13/2018, 11:47 AM

## 2018-01-13 NOTE — Progress Notes (Signed)
eLink Physician-Brief Progress Note Patient Name: [name] DOB: 07/19/1950 MRN: [id number]   Date of Service  01/13/2018  HPI/Events of Note  Repeat CXR showing the dialysis catheter is proximal but still in the vein  eICU Interventions  Contacted ground crew to see if they can change it over guide wire and put a new dialysis catheter in.        Sharia Reeve 01/13/2018, 6:37 AM

## 2018-01-13 NOTE — Progress Notes (Signed)
ELink called and notified of patient continued confusion. Patient alert to self and place and follows commands. No new orders at this time.

## 2018-01-14 ENCOUNTER — Inpatient Hospital Stay (HOSPITAL_COMMUNITY): Payer: Medicare Other

## 2018-01-14 DIAGNOSIS — I319 Disease of pericardium, unspecified: Secondary | ICD-10-CM

## 2018-01-14 LAB — CBC
HCT: 27.3 % — ABNORMAL LOW (ref 39.0–52.0)
Hemoglobin: 8.6 g/dL — ABNORMAL LOW (ref 13.0–17.0)
MCH: 29.6 pg (ref 26.0–34.0)
MCHC: 31.5 g/dL (ref 30.0–36.0)
MCV: 93.8 fL (ref 78.0–100.0)
Platelets: 164 10*3/uL (ref 150–400)
RBC: 2.91 MIL/uL — ABNORMAL LOW (ref 4.22–5.81)
RDW: 16.7 % — ABNORMAL HIGH (ref 11.5–15.5)
WBC: 14 10*3/uL — ABNORMAL HIGH (ref 4.0–10.5)

## 2018-01-14 LAB — COMPREHENSIVE METABOLIC PANEL
ALT: 2540 U/L — ABNORMAL HIGH (ref 0–44)
AST: 565 U/L — ABNORMAL HIGH (ref 15–41)
Albumin: 2.7 g/dL — ABNORMAL LOW (ref 3.5–5.0)
Alkaline Phosphatase: 193 U/L — ABNORMAL HIGH (ref 38–126)
Anion gap: 15 (ref 5–15)
BUN: 32 mg/dL — ABNORMAL HIGH (ref 8–23)
CO2: 23 mmol/L (ref 22–32)
Calcium: 9 mg/dL (ref 8.9–10.3)
Chloride: 102 mmol/L (ref 98–111)
Creatinine, Ser: 3.75 mg/dL — ABNORMAL HIGH (ref 0.61–1.24)
GFR calc Af Amer: 18 mL/min — ABNORMAL LOW (ref >60)
GFR calc non Af Amer: 16 mL/min — ABNORMAL LOW (ref >60)
Glucose, Bld: 142 mg/dL — ABNORMAL HIGH (ref 70–99)
Potassium: 4 mmol/L (ref 3.5–5.1)
Sodium: 140 mmol/L (ref 135–145)
Total Bilirubin: 3.9 mg/dL — ABNORMAL HIGH (ref 0.3–1.2)
Total Protein: 6.4 g/dL — ABNORMAL LOW (ref 6.5–8.1)

## 2018-01-14 LAB — GLUCOSE, CAPILLARY
Glucose-Capillary: 126 mg/dL — ABNORMAL HIGH (ref 70–99)
Glucose-Capillary: 161 mg/dL — ABNORMAL HIGH (ref 70–99)
Glucose-Capillary: 123 mg/dL — ABNORMAL HIGH (ref 70–99)
Glucose-Capillary: 126 mg/dL — ABNORMAL HIGH (ref 70–99)
Glucose-Capillary: 149 mg/dL — ABNORMAL HIGH (ref 70–99)
Glucose-Capillary: 176 mg/dL — ABNORMAL HIGH (ref 70–99)
Glucose-Capillary: 136 mg/dL — ABNORMAL HIGH (ref 70–99)
Glucose-Capillary: 232 mg/dL — ABNORMAL HIGH (ref 70–99)

## 2018-01-14 LAB — CULTURE, BLOOD (ROUTINE X 2)
Culture: NO GROWTH
Special Requests: ADEQUATE

## 2018-01-14 LAB — RENAL FUNCTION PANEL
Albumin: 2.6 g/dL — ABNORMAL LOW (ref 3.5–5.0)
Anion gap: 13 (ref 5–15)
BUN: 38 mg/dL — ABNORMAL HIGH (ref 8–23)
CO2: 23 mmol/L (ref 22–32)
Calcium: 9.1 mg/dL (ref 8.9–10.3)
Chloride: 102 mmol/L (ref 98–111)
Creatinine, Ser: 4.66 mg/dL — ABNORMAL HIGH (ref 0.61–1.24)
GFR calc Af Amer: 14 mL/min — ABNORMAL LOW (ref >60)
GFR calc non Af Amer: 12 mL/min — ABNORMAL LOW (ref >60)
Glucose, Bld: 182 mg/dL — ABNORMAL HIGH (ref 70–99)
Phosphorus: 4.1 mg/dL (ref 2.5–4.6)
Potassium: 4 mmol/L (ref 3.5–5.1)
Sodium: 138 mmol/L (ref 135–145)

## 2018-01-14 LAB — PROTIME-INR
INR: 2.05
Prothrombin Time: 22.9 seconds — ABNORMAL HIGH (ref 11.4–15.2)

## 2018-01-14 LAB — MAGNESIUM: Magnesium: 2.7 mg/dL — ABNORMAL HIGH (ref 1.7–2.4)

## 2018-01-14 LAB — ECHOCARDIOGRAM LIMITED
Height: 75 in
Weight: 3770.75 oz

## 2018-01-14 MED ORDER — RENA-VITE PO TABS
1.0000 | ORAL_TABLET | Freq: Every day | ORAL | Status: DC
  Administered 2018-01-14 – 2018-01-20 (×7): 1 via ORAL
  Filled 2018-01-14 (×7): qty 1

## 2018-01-14 MED ORDER — ENSURE ENLIVE PO LIQD
237.0000 mL | Freq: Three times a day (TID) | ORAL | Status: DC
  Administered 2018-01-14 – 2018-01-19 (×8): 237 mL via ORAL

## 2018-01-14 MED ORDER — MIDODRINE HCL 5 MG PO TABS
5.0000 mg | ORAL_TABLET | Freq: Three times a day (TID) | ORAL | Status: DC
Start: 2018-01-14 — End: 2018-01-21
  Administered 2018-01-14 – 2018-01-21 (×22): 5 mg via ORAL
  Filled 2018-01-14 (×20): qty 1

## 2018-01-14 MED ORDER — SODIUM CHLORIDE 0.9 % IV SOLN
INTRAVENOUS | Status: DC | PRN
  Administered 2018-01-14 (×2): 250 mL via INTRAVENOUS

## 2018-01-14 MED ORDER — PIPERACILLIN-TAZOBACTAM 3.375 G IVPB: 3.3750 g | Freq: Two times a day (BID) | INTRAVENOUS | Status: DC

## 2018-01-14 MED ORDER — CHLORHEXIDINE GLUCONATE CLOTH 2 % EX PADS
6.0000 | MEDICATED_PAD | Freq: Every day | CUTANEOUS | Status: DC
  Administered 2018-01-16: 6 via TOPICAL

## 2018-01-14 NOTE — Progress Notes (Addendum)
Nutrition Follow-up  DOCUMENTATION CODES:   Obesity unspecified  INTERVENTION:   Ensure Enlive po TID, each supplement provides 350 kcal and 20 grams of protein  Magic cup TID with meals, each supplement provides 290 kcal and 9 grams of protein  Add Renal MVI  Feeding assistance to promote oral intake  If po intake does not improve over the next 48 hours; recommend insertion of Cortrak tube with initiation of Tube Feeding Tube Feeding:  Vital 1.5 @ 65 ml/hr Pro-Stat 30 mL daily Provides 2440 kcals, 121 g of protein and 1186 mL of free water Meets 100% estimated calorie and protein needs   NUTRITION DIAGNOSIS:   Inadequate oral intake related to acute illness as evidenced by NPO status. Being addressed via supplements, feeding assistance  GOAL:   Patient will meet greater than or equal to 90% of their needs  Not Met  MONITOR:   Labs, Weight trends, Vent status  REASON FOR ASSESSMENT:   Ventilator    ASSESSMENT:   67 yo male admitted with septic and cardiogenic shock, acute respiratory failure requiring vent support, pericaridal effusion taking emergently to OR. Pt with hx of ESRD on HD, DM, HTN, NICM EF 20%, GERD, HLD, PVD   8/12 Intubated, OR for pericardial window 8/13 CVVHD started 8/14 Extubated 8/15 CVVHD stopped   Noted plan for HD in AM  Pt now confused, agitated, soft restraints in place Pt taking some applesauce but minimal po intake. RN reports pt took some water, [place] ice and juice yesterday but today mental status is limiting po intake.  Pt needs max enocouragement and re-direction to safely take po.  Pt with inadequate nutrition x 5 days. NPO/CL until today; currently taking minimal amounts of po. TF ordered on 8/14 but pt extubated that day  Constipated; recommend bowel regimen  Labs: phosphorus 3.4, potassium wdl, CBGs 123-161, lipase wdl, LFTs trending down Meds: reviewed   Diet Order:   Diet Order            Diet full liquid Room  service appropriate? Yes; Fluid consistency: Thin  Diet effective now              EDUCATION NEEDS:   Not appropriate for education at this time  Skin:  Skin Assessment: Skin Integrity Issues: Skin Integrity Issues:: Incisions Incisions: chest  Last BM:  8/9  Height:   Ht Readings from Last 1 Encounters:  01/09/18 _0  (1.905 m)    Weight:   Wt Readings from Last 1 Encounters:  01/14/18 106.9 kg    Ideal Body Weight:  89 kg  BMI:  Body mass index is 29.46 kg/m.  Estimated Nutritional Needs:   Kcal:  2400-2600 kcals   Protein:  120-130 g  Fluid:  1000 mL plus UOP   [employer] MS, RD, LDN, CNSC [phone] Pager  [phone] Weekend/On-Call Pager

## 2018-01-14 NOTE — Progress Notes (Signed)
PT Cancellation Note  Patient Details Name: [name] MRN: [id number] DOB: 1951/03/13   Cancelled Treatment:    Reason Eval/Treat Not Completed: Patient not medically ready(Rn reports agitated, restrained and not currently appropriate)   [name] B [name] 01/14/2018, 10:34 AM Elwyn Reach, [place]

## 2018-01-14 NOTE — Progress Notes (Addendum)
ANTICOAGULATION CONSULT NOTE  Pharmacy Consult for Warfarin Management  Indication: History of DVT and pAF  No Known Allergies  Patient Measurements: Height: _0  (190.5 cm) Weight: 235 lb 10.8 oz (106.9 kg) IBW/kg (Calculated) : 84.5  Vital Signs: Temp: 97.9 F (36.6 C) (08/16 0720) Temp Source: Oral (08/16 0720) BP: 83/65 (08/16 0800) Pulse Rate: 102 (08/16 0822)  Labs: Recent Labs    01/12/18 0346  01/13/18 0351 01/13/18 1643 01/14/18 0348  HGB 9.0*  --  9.4*  --  8.6*  HCT 27.4*  --  30.1*  --  27.3*  PLT 219  --  193  --  164  LABPROT 31.7*  --  20.1*  --  22.9*  INR 3.09  --  1.73  --  2.05  CREATININE 4.05*   < > 2.80* 2.70* 3.75*   < > = values in this interval not displayed.   Hepatic Function Latest Ref Rng & Units 01/14/2018 01/13/2018 01/13/2018  Total Protein 6.5 - 8.1 g/dL 6.4(L) - 7.0  Albumin 3.5 - 5.0 g/dL 2.7(L) 2.8(L) 3.0(L)  AST 15 - 41 U/L 565(H) - 1,788(H)  ALT 0 - 44 U/L 2,540(H) - 4,017(H)  Alk Phosphatase 38 - 126 U/L 193(H) - 208(H)  Total Bilirubin 0.3 - 1.2 mg/dL 3.9(H) - 3.9(H)  Bilirubin, Direct 0.0 - 0.2 mg/dL - - 2.3(H)    Estimated Creatinine Clearance: 26.3 mL/min (A) (by C-G formula based on SCr of 3.75 mg/dL (H)).   Medical History: Past Medical History:  Diagnosis Date  . Anemia   . Arthritis    HNP- lumbar, "all over my body"  . Blood transfusion    "years ago; blood was low" (08/05/2013)  . CKD (chronic kidney disease) stage 4, GFR 15-29 ml/min (HCC) 03/18/2012   [place]- T,TH,Sat.  . Diabetic nephropathy ([place])   . Diabetic retinopathy   . DVT (deep venous thrombosis) ([place])    "got one in my right leg now; I've had one before too, not sure which leg" (08/05/2013)  . ESRD (end stage renal disease) on dialysis [facility])    "just started today, (08/04/2013)"  . Family history of anesthesia complication    " my son wakes up slowly"  . GERD (gastroesophageal reflux disease)    uses alka seltzere on occas.   [name])    "one q now and then" (08/05/2013)  . Hyperlipidemia   . Hypertension   . 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 Non Hodgkin's lymphoma ([place])    Tx 2009; "had chemo; it went away" (08/05/2013)  . Noncompliance 03/16/2012  . NSVT (nonsustained ventricular tachycardia) ([place]) 03/18/2012  . Peripheral vascular disease ([place])   . Pneumonia 2013   hosp.-   . Poor historian    pt. unsure of several answers to health history questions   . Skin cancer    melanoma - head  . Sleep apnea    "suppose to have a sleep study, but they never told me when. (08/05/2013)    Medications:  Scheduled:  . chlorhexidine  15 mL Mouth Rinse BID  . Chlorhexidine Gluconate Cloth  6 each Topical Daily  . hydrocortisone sodium succinate  50 mg Intravenous Q6H  . insulin aspart  2-6 Units Subcutaneous Q4H  . mouth rinse  15 mL Mouth Rinse q12n4p  . midodrine  2.5 mg Oral TID WC  . pantoprazole  40  mg Intravenous Q12H  . sodium chloride flush  10-40 mL Intracatheter Q12H   Infusions:  . sodium chloride Stopped (01/13/18 0659)  . sodium chloride Stopped (01/14/18 0624)  . norepinephrine (LEVOPHED) Adult infusion Stopped (01/14/18 0313)  . piperacillin-tazobactam (ZOSYN)  IV      Assessment: Pt is a 26 yoM with a history of DVT (15 years ago per pt) receiving warfarin PTA. During admission, pt developed atrial fibrillation. Home warfarin dose is 7.5 mg daily except 10 mg Mon/Thurs. Warfarin has been held since admission secondary to elevated INR, peaked at 10 has downtrended. Of note, patient received 10 mg [place] phytonadione and a total of 20 mg IV phytonadione on 8/13.   Plan to restart warfarin lower than home dose given shock liver.  LFTs continue to decrease, but remain significantly elevated.  Patient was scheduled to be given warfarin 8/15, but due to combative nature warfarin was unable to be administered.  Despite warfarin NOT being given INR increased (1.73 > 2.06), likely due to shock liver.   Goal of Therapy:  INR 2-3 Monitor platelets by anticoagulation protocol: Yes   Plan:  Discussed with Dr. Debara Pickett, will hold off on warfarin until INR subtherapeutic. Will continue to monitor.  Daily INR Monitor LFTs Monitor for s/sx of bleeding     Claiborne Billings, PharmD PGY2 Cardiology Pharmacy Resident Phone [phone] 01/14/2018 11:10 AM

## 2018-01-14 NOTE — Progress Notes (Addendum)
TCTS DAILY ICU PROGRESS NOTE                   [address]            [place] [address]          [phone]   4 Days Post-Op Procedure(s) (LRB): SUBXYPHOID PERICARDIAL WINDOW (N/A)  Total Length of Stay:  LOS: 5 days   Subjective: Patient is not having any pain this morning. Pericardial drain is out. He is in restraints since he had a combative episode yesterday.   Objective: Vital signs in last 24 hours: Temp:  [97.9 F (36.6 C)-98.7 F (37.1 C)] 97.9 F (36.6 C) (08/16 0720) Pulse Rate:  [88-121] 102 (08/16 0822) Cardiac Rhythm: Atrial fibrillation (08/16 0800) Resp:  [8-23] 12 (08/16 0822) BP: (74-131)/(56-97) 83/65 (08/16 0800) SpO2:  [91 %-100 %] 99 % (08/16 0822) Arterial Line BP: (80-140)/(40-64) 140/63 (08/15 1900) Weight:  [106.9 kg] 106.9 kg (08/16 0345)  Filed Weights   01/12/18 0431 01/13/18 0412 01/14/18 0345  Weight: 112.6 kg 108 kg 106.9 kg    Weight change: -1.1 kg   Hemodynamic parameters for last 24 hours: CVP:  [9 mmHg-12 mmHg] 12 mmHg  Intake/Output from previous day: 08/15 0701 - 08/16 0700 In: 363.1 [P.O.:50; I.V.:113; IV Piggyback:200.1] Out: 382 [Chest Tube:30]  Intake/Output this shift: No intake/output data recorded.  Current Meds: Scheduled Meds: . chlorhexidine  15 mL Mouth Rinse BID  . Chlorhexidine Gluconate Cloth  6 each Topical Daily  . hydrocortisone sodium succinate  50 mg Intravenous Q6H  . insulin aspart  2-6 Units Subcutaneous Q4H  . mouth rinse  15 mL Mouth Rinse q12n4p  . midodrine  2.5 mg Oral TID WC  . pantoprazole  40 mg Intravenous Q12H  . sodium chloride flush  10-40 mL Intracatheter Q12H  . warfarin  5 mg Oral ONCE-1800   Continuous Infusions: . sodium chloride Stopped (01/13/18 0659)  . sodium chloride Stopped (01/14/18 0624)  . norepinephrine (LEVOPHED) Adult infusion Stopped (01/14/18 0313)  . piperacillin-tazobactam (ZOSYN)  IV    . dialysis replacement fluid (prismasate) 500 mL/hr at  01/13/18 1010  . dialysis replacement fluid (prismasate) 300 mL/hr at 01/13/18 1024  . dialysate (PRISMASATE) 2,000 mL/hr at 01/13/18 1454   PRN Meds:.sodium chloride, sodium chloride, fentaNYL (SUBLIMAZE) injection, heparin, LORazepam, ondansetron (ZOFRAN) IV, sodium chloride flush  General appearance: alert, cooperative and no distress Heart: sinus tachycardia Lungs: clear to auscultation bilaterally Abdomen: soft, non-tender; bowel sounds normal; no masses,  no organomegaly Extremities: 2+ pitting pedal edema in bilateral lower extremity Wound: clean and dry  Lab Results: CBC: Recent Labs    01/13/18 0351 01/14/18 0348  WBC 15.3* 14.0*  HGB 9.4* 8.6*  HCT 30.1* 27.3*  PLT 193 164   BMET:  Recent Labs    01/13/18 1643 01/14/18 0348  NA 138 140  K 3.9 4.0  CL 100 102  CO2 25 23  GLUCOSE 120* 142*  BUN 21 32*  CREATININE 2.70* 3.75*  CALCIUM 8.8* 9.0    CMET: Lab Results  Component Value Date   WBC 14.0 (H) 01/14/2018   HGB 8.6 (L) 01/14/2018   HCT 27.3 (L) 01/14/2018   PLT 164 01/14/2018   GLUCOSE 142 (H) 01/14/2018   CHOL 129 01/14/2011   TRIG 137 01/14/2011   HDL 13 (L) 01/14/2011   LDLCALC 89 01/14/2011   ALT 2,540 (H) 01/14/2018   AST 565 (H) 01/14/2018   NA 140 01/14/2018  K 4.0 01/14/2018   CL 102 01/14/2018   CREATININE 3.75 (H) 01/14/2018   BUN 32 (H) 01/14/2018   CO2 23 01/14/2018   TSH 0.155 (L) 01/14/2011   INR 2.05 01/14/2018   HGBA1C 8.7 (H) 05/03/2015      PT/INR:  Recent Labs    01/14/18 0348  LABPROT 22.9*  INR 2.05   Radiology: No results found.   Assessment/Plan: S/P Procedure(s) (LRB): SUBXYPHOID PERICARDIAL WINDOW (N/A)  1. CV-Sinus tachycardia with a rate in the low 100s. BP soft at times but mostly remains with MAPS in the 70s-80s. Midodrine initiated. Cardiology following. Pericardial drain removed yesterday without issue 2. Pulm-extubated and tolerating 2L [place]. No CXR this morning.  3. Renal-On CRRT creatinine  is 3.75 this morning. Electrolytes okay.  4. H and H stable at 8.6/27.3, expected acute blood loss anemia, platelets 164k 5. Endo-blood glucose well controlled on current regimen 6. LFTs trending down. GI and surgery consulted. Will need MRCP prior to discharge to r/o choledocholithiasis per surgery 7. Hx of DVT-not a candidate for systemic anticoagulation at this time. INR today is 2.05 8. Continue Zosyn for LLL pneumonia and acute hypoxic respiratory failure-Vanc discontinued  Plan: Pericardial incision is healing well. Pericardial drain has been removed. Continue medical management per CCM. Pain is well controlled at this time.    Elgie Collard 01/14/2018 8:33 AM    I have seen and examined the patient and agree with the assessment and plan as outlined.  Plan per medical teams.    Rexene Alberts, MD 01/14/2018 8:52 AM

## 2018-01-14 NOTE — Progress Notes (Signed)
PULMONARY / CRITICAL CARE MEDICINE   Name: [name] MRN: [id number] DOB: May 07, 1951    ADMISSION DATE:  01/09/2018 CONSULTATION DATE: 01/09/18  REFERRING MD: Transfer from [place]: Shock  HISTORY OF PRESENT ILLNESS:   67yoM with hx ESRD on HD (last HD on 01/08/18), OSA, PVD, NHL (2009), DM, HTN, GERD, Anemia, CHF (EF 20%), LE DVT (remote 47yrs ago per pt) on Coumadin.  Transferred from [facility] he had presented with generalized malaise and chest pain.  Found to be hypotensive.  Echocardiogram subsequently showed large pericardial effusion with evidence of cardiac tamponade.  Hemodynamics have improved since drainage via a pericardial window.  The patient was successfully extubated 8/14.  SUBJECTIVE:   Ongoing episodes of intermittent confusion with agitation consistent with delirium.  Now weaned off vasopressors.  No specific complaints.  Pulled at his dialysis catheter on several occasions.   VITAL SIGNS: BP (!) 83/65 (BP Location: Left Arm)   Pulse (!) 102   Temp 97.9 F (36.6 C) (Oral)   Resp 12   Ht 6\' 3"  (1.905 m)   Wt 106.9 kg   SpO2 99%   BMI 29.46 kg/m   HEMODYNAMICS: CVP:  [9 mmHg-12 mmHg] 12 mmHgLevophed @ 4mcg  INTAKE / OUTPUT: I/O last 3 completed shifts: In: 571.3 [P.O.:50; I.V.:221.2; IV Piggyback:300.1] Out: 1075 [Other:1005; Chest Tube:70]  PHYSICAL EXAMINATION: General:  Chronically ill-appearing male, no acute distress in bed HEENT: MM pink/moist Neuro: Somnolent today. CV: s1s2 rrr, no m/r/g, pericardial drain out, dressing clean and dry PULM: even/non-labored nasal cannula, lungs clear  EX:NTZG, non-tender, bsx4 active  Extremities: warm/dry, plus BLE edema  Skin: no rashes or lesions  LABS:  BMET Recent Labs  Lab 01/13/18 0351 01/13/18 1643 01/14/18 0348  NA 137 138 140  K 4.1 3.9 4.0  CL 100 100 102  CO2 23 25 23   BUN 21 21 32*  CREATININE 2.80* 2.70* 3.75*  GLUCOSE 142* 120* 142*    Electrolytes Recent Labs  Lab 01/11/18 0008 01/11/18 0412  01/12/18 0346 01/12/18 1523 01/13/18 0351 01/13/18 1643 01/14/18 0348  CALCIUM  --  8.0*   < > 8.0* 8.5* 8.4* 8.8* 9.0  MG 2.2 2.2  --   --   --   --   --  2.7*  PHOS 8.1*  --    < > 3.5 4.1  --  3.4  --    < > = values in this interval not displayed.   CBC Recent Labs  Lab 01/12/18 0346 01/13/18 0351 01/14/18 0348  WBC 12.8* 15.3* 14.0*  HGB 9.0* 9.4* 8.6*  HCT 27.4* 30.1* 27.3*  PLT 219 193 164   Coag's Recent Labs  Lab 01/12/18 0346 01/13/18 0351 01/14/18 0348  INR 3.09 1.73 2.05   Sepsis Markers Recent Labs  Lab 01/09/18 1532  01/10/18 0354 01/10/18 0713 01/11/18 0005 01/11/18 0008 01/11/18 0305  LATICACIDVEN  --    < > 10.9* 12.7* 10.8*  --  8.5*  PROCALCITON 1.44  --  3.04  --   --  21.21  --    < > = values in this interval not displayed.   ABG Recent Labs  Lab 01/11/18 0345 01/11/18 0937 01/12/18 0858  PHART 7.406 7.429 7.515*  PCO2ART 34.5 36.7 34.5  PO2ART 204.0* 42.0* 223.0*    Liver Enzymes Recent Labs  Lab 01/12/18 0346  01/13/18 0351 01/13/18 1643 01/14/18 0348  AST 4,778*  --  1,788*  --  565*  ALT 5,783*  --  4,017*  --  2,540*  ALKPHOS 196*  --  208*  --  193*  BILITOT 3.5*  --  3.9*  --  3.9*  ALBUMIN 3.0*   < > 3.0* 2.8* 2.7*   < > = values in this interval not displayed.   Cardiac Enzymes Recent Labs  Lab 01/09/18 2217 01/10/18 0354 01/11/18 0412  TROPONINI 0.11* 0.33* 2.35*    Glucose Recent Labs  Lab 01/13/18 1208 01/13/18 1559 01/13/18 2016 01/13/18 2348 01/14/18 0350 01/14/18 0727  GLUCAP 126* 108* 125* 123* 126* 161*   Imaging No results found.   STUDIES:  CT Abdomen/Pelvis w IV contrast 8/12 >> cholelithiasis with gallbladder wall thickening, pericholecystic edema suggestive of cholecystitis, infiltration in the peripancreatic fat consistent with acute pancreatitis, no loculated collections, diffuse fatty infiltration of the liver,  small left pleural effusion, consolidation in the left lung base, large pericardial effusion, small periumbilical hernia containing fat  CULTURES: BCx2 8/11 >>  Sputum 8/11 >>  UA 8/11 >>  Pericardial Fluid 8/12 >>   ANTIBIOTICS: Vanc 8/11 >> 8/14 Zosyn 8/11 >>  SIGNIFICANT EVENTS: 8/11 Presented to [facility] ER with abd pain, shock, N/V >> diagnosed with pneumonia and transferred to [facility] ICU 8/12  Patient decompensated overnight, intubated > to OR for pericardial window.   8/13  Pauses in early am, CVVHD started.  Levophed 34 mcg's, vasopressin  8/15 all  Vasoactives off  LINES/TUBES: RUE AV fistula LIJ TLC 8/12 >> R IJ HD cath 8/13>>>  DISCUSSION: 67 year old man with ESRD with cardiogenic shock secondary to tamponade.  Now resolved post pericardial window.  Ongoing issues of delirium  ASSESSMENT / PLAN:  Acute Hypoxic Respiratory Failure-improved.  Extubated 8/14 LLL Pneumonia P: Pulmonary hygiene Mobilize as able Follow-up chest x-ray Supplemental O2 as needed to keep sats >90%   Shock - suspect cardiogenic as primary, +/- septic component  Hemorrhagic Tamponade - INR >10 on admit -now S/P pericardial window 8/12 AFib RVR (new diagnosis) - converted 8/13 Hx HTN and CHF (EF 20%) QT prolongation P: Continue ICU monitoring-ensure the patient can tolerate conventional hemodialysis prior to transfer to stepdown. Continue stress steroids for total of 7 days   ESRD on HD AGMA / Lactic Acidosis  P:  Transition to conventional hemodialysis   Abdominal Pain  Nausea / Vomiting  Cholelithiasis -  Pancreatitis  Shock Liver  P: Full diet Appreciate GI, surgery input- likely needs MRCP prior to discharge. 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  Can resume warfarin.  Patient is currently therapeutic. SCDs   Shock - secondary to pericardial effusion now resolved. P: Stop all antibiotics  DM P: SSI   CBG Q4  Acute Metabolic Encephalopathy - improving slowly P: Shows feature of delirium.  Normalize environment.  Low-dose Ativan for agitation as QTC is prolonged. Transfer to stepdown if remains hemodynamically stable following hemodialysis.  FAMILY  - No family available am 8/16 during rounds.     - Inter-disciplinary family meet or Palliative Care meeting due by: 01/16/18  Kipp Brood, MD [facility] ICU Physician [place]  Pager: [phone] Mobile: [phone] After hours: [phone].

## 2018-01-14 NOTE — Progress Notes (Signed)
RenalAssessment:  1ESRD--now off CRRT 2s/p pericardial effusion drainage 3 RUE AVF now functioning with improved BP 4 s/p VDRF 5 Shock, improved 6 coagulopathy 7 pos fluid balance 8 Acidosis--resolved  Plan: Off CRRT, on midodrine, HD in AM  Subjective: Interval History: Off CRRT yesterday  Objective: Vital signs in last 24 hours: Temp:  [97.9 F (36.6 C)-98.7 F (37.1 C)] 97.9 F (36.6 C) (08/16 0720) Pulse Rate:  [88-121] 112 (08/16 1145) Resp:  [8-23] 12 (08/16 1145) BP: (74-154)/(56-97) 109/75 (08/16 1145) SpO2:  [91 %-100 %] 100 % (08/16 1145) Arterial Line BP: (80-140)/(40-64) 140/63 (08/15 1900) Weight:  [106.9 kg] 106.9 kg (08/16 0345) Weight change: -1.1 kg  Intake/Output from previous day: 08/15 0701 - 08/16 0700 In: 363.1 [P.O.:50; I.V.:113; IV Piggyback:200.1] Out: 382 [Chest Tube:30] Intake/Output this shift: No intake/output data recorded.  General appearance: alert and cooperative Head: Normocephalic, without obvious abnormality, atraumatic Resp: clear to auscultation bilaterally Extremities: edema 1+  Lab Results: Recent Labs    01/13/18 0351 01/14/18 0348  WBC 15.3* 14.0*  HGB 9.4* 8.6*  HCT 30.1* 27.3*  PLT 193 164   BMET:  Recent Labs    01/13/18 1643 01/14/18 0348  NA 138 140  K 3.9 4.0  CL 100 102  CO2 25 23  GLUCOSE 120* 142*  BUN 21 32*  CREATININE 2.70* 3.75*  CALCIUM 8.8* 9.0   No results for input(s): PTH in the last 72 hours. Iron Studies: No results for input(s): IRON, TIBC, TRANSFERRIN, FERRITIN in the last 72 hours. Studies/Results: Dg Chest Port 1 View  Result Date: 01/13/2018 CLINICAL DATA:  Hemodialysis catheter malfunction. A new line has been placed. EXAM: PORTABLE CHEST 1 VIEW COMPARISON:  January 13, 2018 FINDINGS: The new right dialysis catheter terminates near the caval atrial junction. No pneumothorax. A left central line terminates in the central SVC. Mild edema is stable. More focal opacity and  possible small effusion on the left is stable. Stable cardiomediastinal silhouette. IMPRESSION: 1. The new right central line terminates near the caval atrial junction without pneumothorax. Left central line is stable. 2. Persistent edema. 3. Persistent focal opacity in the left base with possible small associated effusion. Electronically Signed   By: Dorise Bullion III M.D   On: 01/13/2018 07:45   Dg Chest Port 1 View  Result Date: 01/13/2018 CLINICAL DATA:  Patient pulled out central line. Check placement. EXAM: PORTABLE CHEST 1 VIEW COMPARISON:  01/12/2018 FINDINGS: Right central venous catheter has been withdrawn since the previous study. Tip is now projected over the right clavicular head, likely in the junction of the subclavian and jugular vein region. Advancement is suggested for better positioning. Left central venous catheter tip remains at the cavoatrial junction level. No pneumothorax. Shallow inspiration. Heart size and pulmonary vascularity are enlarged. Mild bilateral perihilar infiltrations may represent edema or pneumonia. Probable consolidation or atelectasis in the left lung base behind the heart. Calcification of the aorta. IMPRESSION: Right central venous catheter tip now projects over the expected location of the junction of the subclavian and jugular vein region. Persistent bilateral perihilar infiltrates. Cardiac enlargement. Consolidation or atelectasis in the left lung base behind the heart. Electronically Signed   By: Lucienne Capers [name]   On: 01/13/2018 06:25    Scheduled: . chlorhexidine  15 mL Mouth Rinse BID  . Chlorhexidine Gluconate Cloth  6 each Topical Daily  . insulin aspart  2-6 Units Subcutaneous Q4H  . mouth rinse  15 mL Mouth Rinse q12n4p  . midodrine  5 mg Oral TID WC  . sodium chloride flush  10-40 mL Intracatheter Q12H   Continuous: . sodium chloride Stopped (01/14/18 0624)  . norepinephrine (LEVOPHED) Adult infusion Stopped (01/14/18 0313)     LOS: 5  days   Estanislado Emms 01/14/2018,11:51 AM

## 2018-01-14 NOTE — Progress Notes (Signed)
  Echocardiogram 2D Echocardiogram has been performed.  [name] 01/14/2018, 10:04 AM

## 2018-01-14 NOTE — Progress Notes (Signed)
[facility]/Trauma Progress Note  4 Days Post-Op   Assessment/Plan Active Problems:   Hypotension   Sepsis (HCC)   Pericardial effusion with cardiac tamponade   Acute respiratory failure with hypoxia (HCC)   Septic shock (HCC)   Cholecystitis   Metabolic acidosis   Elevated LFTs   Idiopathic acute pancreatitis without infection or necrosis  Cholelithiasis - agree with GI that pt will need MRCP prior to discharge to rule out choledocholithiasis. LFTs' improving. We would not proceed with lap chole unless MRCP indicates cholecystitis coorelating with clinical exam. Currently pt is without abdominal pain.  - We will monitor pt peripherally.    LOS: 5 days    Subjective: Pt denies abdominal pain, nausea or vomiting. No new complaints.   Objective: Vital signs in last 24 hours: Temp:  [98.4 F (36.9 C)-98.7 F (37.1 C)] 98.6 F (37 C) (08/16 0400) Pulse Rate:  [88-121] 102 (08/16 0700) Resp:  [8-23] 10 (08/16 0700) BP: (74-131)/(56-97) 91/63 (08/16 0700) SpO2:  [91 %-100 %] 99 % (08/16 0700) Arterial Line BP: (80-140)/(40-64) 140/63 (08/15 1900) Weight:  [106.9 kg] 106.9 kg (08/16 0345) Last BM Date: 01/07/18  Intake/Output from previous day: 08/15 0701 - 08/16 0700 In: 363.1 [P.O.:50; I.V.:113; IV Piggyback:200.1] Out: 382 [Chest Tube:30] Intake/Output this shift: No intake/output data recorded.  PE: Gen:  Alert, NAD, pleasant, cooperative Pulm:  Rate and effort normal Abd: Soft, NT/ND, no HSM, no murphy's sign Skin: no rashes noted, warm and dry   Anti-infectives: Anti-infectives (From admission, onward)   Start     Dose/Rate Route Frequency Ordered Stop   01/12/18 0500  vancomycin (VANCOCIN) IVPB 1000 mg/200 mL premix  Status:  Discontinued     1,000 mg 200 mL/hr over 60 Minutes Intravenous Every 24 hours 01/11/18 1534 01/12/18 0853   01/11/18 1800  vancomycin (VANCOCIN) IVPB 1000 mg/200 mL premix  Status:  Discontinued     1,000 mg 200 mL/hr  over 60 Minutes Intravenous Every 24 hours 01/11/18 0143 01/11/18 1534   01/11/18 0600  piperacillin-tazobactam (ZOSYN) IVPB 3.375 g     3.375 g 100 mL/hr over 30 Minutes Intravenous Every 6 hours 01/11/18 0143     01/10/18 0825  ceFAZolin (ANCEF) IVPB 2g/100 mL premix  Status:  Discontinued     2 g 200 mL/hr over 30 Minutes Intravenous 30 min pre-op 01/10/18 0825 01/10/18 1008   01/10/18 0400  piperacillin-tazobactam (ZOSYN) IVPB 3.375 g  Status:  Discontinued     3.375 g 12.5 mL/hr over 240 Minutes Intravenous Every 12 hours 01/09/18 1817 01/11/18 0143   01/10/18 0015  vancomycin (VANCOCIN) 2,500 mg in sodium chloride 0.9 % 500 mL IVPB     2,500 mg 250 mL/hr over 120 Minutes Intravenous  Once 01/10/18 0003 01/10/18 0239   01/09/18 1630  vancomycin (VANCOCIN) IVPB 1000 mg/200 mL premix  Status:  Discontinued     1,000 mg 200 mL/hr over 60 Minutes Intravenous  Once 01/09/18 1630 01/09/18 2336   01/09/18 1545  piperacillin-tazobactam (ZOSYN) IVPB 3.375 g     3.375 g 100 mL/hr over 30 Minutes Intravenous  Once 01/09/18 1539 01/09/18 1713   01/09/18 1545  vancomycin (VANCOCIN) IVPB 1000 mg/200 mL premix  Status:  Discontinued     1,000 mg 200 mL/hr over 60 Minutes Intravenous  Once 01/09/18 1539 01/09/18 2336      Lab Results:  Recent Labs    01/13/18 0351 01/14/18 0348  WBC 15.3* 14.0*  HGB 9.4* 8.6*  HCT 30.1*  27.3*  PLT 193 164   BMET Recent Labs    01/13/18 1643 01/14/18 0348  NA 138 140  K 3.9 4.0  CL 100 102  CO2 25 23  GLUCOSE 120* 142*  BUN 21 32*  CREATININE 2.70* 3.75*  CALCIUM 8.8* 9.0   PT/INR Recent Labs    01/13/18 0351 01/14/18 0348  LABPROT 20.1* 22.9*  INR 1.73 2.05   CMP     Component Value Date/Time   NA 140 01/14/2018 0348   NA 142 09/04/2016 1628   K 4.0 01/14/2018 0348   CL 102 01/14/2018 0348   CO2 23 01/14/2018 0348   GLUCOSE 142 (H) 01/14/2018 0348   BUN 32 (H) 01/14/2018 0348   BUN 22 09/04/2016 1628   CREATININE 3.75 (H)  01/14/2018 0348   CALCIUM 9.0 01/14/2018 0348   CALCIUM 9.1 01/09/2013 0903   PROT 6.4 (L) 01/14/2018 0348   ALBUMIN 2.7 (L) 01/14/2018 0348   AST 565 (H) 01/14/2018 0348   ALT 2,540 (H) 01/14/2018 0348   ALKPHOS 193 (H) 01/14/2018 0348   BILITOT 3.9 (H) 01/14/2018 0348   GFRNONAA 16 (L) 01/14/2018 0348   GFRAA 18 (L) 01/14/2018 0348   Lipase     Component Value Date/Time   LIPASE 35 01/13/2018 0351    Studies/Results: Dg Chest Port 1 View  Result Date: 01/13/2018 CLINICAL DATA:  Hemodialysis catheter malfunction. A new line has been placed. EXAM: PORTABLE CHEST 1 VIEW COMPARISON:  January 13, 2018 FINDINGS: The new right dialysis catheter terminates near the caval atrial junction. No pneumothorax. A left central line terminates in the central SVC. Mild edema is stable. More focal opacity and possible small effusion on the left is stable. Stable cardiomediastinal silhouette. IMPRESSION: 1. The new right central line terminates near the caval atrial junction without pneumothorax. Left central line is stable. 2. Persistent edema. 3. Persistent focal opacity in the left base with possible small associated effusion. Electronically Signed   By: Dorise Bullion III M.D   On: 01/13/2018 07:45   Dg Chest Port 1 View  Result Date: 01/13/2018 CLINICAL DATA:  Patient pulled out central line. Check placement. EXAM: PORTABLE CHEST 1 VIEW COMPARISON:  01/12/2018 FINDINGS: Right central venous catheter has been withdrawn since the previous study. Tip is now projected over the right clavicular head, likely in the junction of the subclavian and jugular vein region. Advancement is suggested for better positioning. Left central venous catheter tip remains at the cavoatrial junction level. No pneumothorax. Shallow inspiration. Heart size and pulmonary vascularity are enlarged. Mild bilateral perihilar infiltrations may represent edema or pneumonia. Probable consolidation or atelectasis in the left lung base  behind the heart. Calcification of the aorta. IMPRESSION: Right central venous catheter tip now projects over the expected location of the junction of the subclavian and jugular vein region. Persistent bilateral perihilar infiltrates. Cardiac enlargement. Consolidation or atelectasis in the left lung base behind the heart. Electronically Signed   By: Lucienne Capers [name]   On: 01/13/2018 06:25      Kalman Drape , [facility] 01/14/2018, 7:49 AM  Pager: [phone] Mon-Wed, Friday 7:00am-4:30pm Thurs 7am-11:30am  Consults: [phone]

## 2018-01-14 NOTE — Progress Notes (Signed)
DAILY PROGRESS NOTE   Patient Name: [name] Date of Encounter: 01/14/2018  Chief Complaint   Combative overnight  Patient Profile   67 yo male with shock, found to have a uremic pericardial effusion and tamponade, s/p pericardial window, now with ongoing hypotension and known systolic CHF - EF 00%.  Subjective   Liver enzymes continue to improve. Combative overnight, consider ammonia level vs ICU delerium (more likely d/t waxing and waning). INR 2.05 today - warfarin restarted.    Objective   Vitals:   01/14/18 0700 01/14/18 0720 01/14/18 0800 01/14/18 0822  BP: 91/63  (!) 83/65   Pulse: (!) 102  96 (!) 102  Resp: _0 Temp:  97.9 F (36.6 C)    TempSrc:  Oral    SpO2: 99%  99% 99%  Weight:      Height:        Intake/Output Summary (Last 24 hours) at 01/14/2018 0853 Last data filed at 01/14/2018 0800 Gross per 24 hour  Intake 358.4 ml  Output 382 ml  Net -23.6 ml   Filed Weights   01/12/18 0431 01/13/18 0412 01/14/18 0345  Weight: 112.6 kg 108 kg 106.9 kg    Physical Exam   General appearance: alert, no distress and extubated, in wrist restraints Neck: no carotid bruit, no JVD and thyroid not enlarged, symmetric, no tenderness/mass/nodules Lungs: diminished breath sounds bibasilar Heart: irregularly irregular rhythm and no murmur Abdomen: soft, non-tender; bowel sounds normal; no masses,  no organomegaly Extremities: extremities normal, atraumatic, no cyanosis or edema Pulses: 2+ and symmetric Skin: Skin color, texture, turgor normal. No rashes or lesions Neurologic: Mental status: sleepy, confused Psych: Pleasant  Inpatient Medications    Scheduled Meds: . chlorhexidine  15 mL Mouth Rinse BID  . Chlorhexidine Gluconate Cloth  6 each Topical Daily  . hydrocortisone sodium succinate  50 mg Intravenous Q6H  . insulin aspart  2-6 Units Subcutaneous Q4H  . mouth rinse  15 mL Mouth Rinse q12n4p  . midodrine  2.5 mg Oral TID WC  . pantoprazole   40 mg Intravenous Q12H  . sodium chloride flush  10-40 mL Intracatheter Q12H  . warfarin  5 mg Oral ONCE-1800    Continuous Infusions: . sodium chloride Stopped (01/13/18 0659)  . sodium chloride Stopped (01/14/18 0624)  . norepinephrine (LEVOPHED) Adult infusion Stopped (01/14/18 0313)  . piperacillin-tazobactam (ZOSYN)  IV    . dialysis replacement fluid (prismasate) 500 mL/hr at 01/13/18 1010  . dialysis replacement fluid (prismasate) 300 mL/hr at 01/13/18 1024  . dialysate (PRISMASATE) 2,000 mL/hr at 01/13/18 1454    PRN Meds: sodium chloride, sodium chloride, fentaNYL (SUBLIMAZE) injection, heparin, LORazepam, ondansetron (ZOFRAN) IV, sodium chloride flush   Labs   Results for orders placed or performed during the hospital encounter of 01/09/18 (from the past 48 hour(s))  I-STAT 3, arterial blood gas (G3+)     Status: Abnormal   Collection Time: 01/12/18  8:58 AM  Result Value Ref Range   pH, Arterial 7.515 (H) 7.350 - 7.450   pCO2 arterial 34.5 32.0 - 48.0 mmHg   pO2, Arterial 223.0 (H) 83.0 - 108.0 mmHg   Bicarbonate 27.9 20.0 - 28.0 mmol/L   TCO2 29 22 - 32 mmol/L   O2 Saturation 100.0 %   Acid-Base Excess 5.0 (H) 0.0 - 2.0 mmol/L   Patient temperature 97.9 F    Sample type ARTERIAL   Glucose, capillary     Status: Abnormal   Collection  Time: 01/12/18 11:31 AM  Result Value Ref Range   Glucose-Capillary 146 (H) 70 - 99 mg/dL  Renal function panel (daily at 1600)     Status: Abnormal   Collection Time: 01/12/18  3:23 PM  Result Value Ref Range   Sodium 138 135 - 145 mmol/L   Potassium 4.3 3.5 - 5.1 mmol/L   Chloride 100 98 - 111 mmol/L   CO2 25 22 - 32 mmol/L   Glucose, Bld 143 (H) 70 - 99 mg/dL   BUN 23 8 - 23 mg/dL   Creatinine, Ser 3.35 (H) 0.61 - 1.24 mg/dL   Calcium 8.5 (L) 8.9 - 10.3 mg/dL   Phosphorus 4.1 2.5 - 4.6 mg/dL   Albumin 3.0 (L) 3.5 - 5.0 g/dL   GFR calc non Af Amer 18 (L) >60 mL/min   GFR calc Af Amer 21 (L) >60 mL/min    Comment:  (NOTE) The eGFR has been calculated using the CKD EPI equation. This calculation has not been validated in all clinical situations. eGFR's persistently <60 mL/min signify possible Chronic Kidney Disease.    Anion gap 13 5 - 15    Comment: Performed at [facility] [address], [place], [place] [address]  Glucose, capillary     Status: Abnormal   Collection Time: 01/12/18  3:23 PM  Result Value Ref Range   Glucose-Capillary 142 (H) 70 - 99 mg/dL  Glucose, capillary     Status: Abnormal   Collection Time: 01/12/18  7:45 PM  Result Value Ref Range   Glucose-Capillary 111 (H) 70 - 99 mg/dL   Comment 1 Notify RN   Glucose, capillary     Status: Abnormal   Collection Time: 01/12/18 11:46 PM  Result Value Ref Range   Glucose-Capillary 121 (H) 70 - 99 mg/dL   Comment 1 Notify RN   Glucose, capillary     Status: Abnormal   Collection Time: 01/13/18  3:46 AM  Result Value Ref Range   Glucose-Capillary 132 (H) 70 - 99 mg/dL   Comment 1 Notify RN   CBC     Status: Abnormal   Collection Time: 01/13/18  3:51 AM  Result Value Ref Range   WBC 15.3 (H) 4.0 - 10.5 K/uL   RBC 3.21 (L) 4.22 - 5.81 MIL/uL   Hemoglobin 9.4 (L) 13.0 - 17.0 g/dL   HCT 30.1 (L) 39.0 - 52.0 %   MCV 93.8 78.0 - 100.0 fL   MCH 29.3 26.0 - 34.0 pg   MCHC 31.2 30.0 - 36.0 g/dL   RDW 16.5 (H) 11.5 - 15.5 %   Platelets 193 150 - 400 K/uL    Comment: Performed at [facility], [place] [address] [place], [place] [address]  Protime-INR     Status: Abnormal   Collection Time: 01/13/18  3:51 AM  Result Value Ref Range   Prothrombin Time 20.1 (H) 11.4 - 15.2 seconds   INR 1.73     Comment: Performed at [facility] [address], [place], [place] [address]  Hepatic function panel     Status: Abnormal   Collection Time: 01/13/18  3:51 AM  Result Value Ref Range   Total Protein 7.0 6.5 - 8.1 g/dL   Albumin 3.0 (L) 3.5 - 5.0 g/dL   AST 1,788 (H) 15 - 41 U/L   ALT 4,017 (H) 0 - 44 U/L    Comment:  RESULTS CONFIRMED BY MANUAL DILUTION   Alkaline Phosphatase 208 (H) 38 -  126 U/L   Total Bilirubin 3.9 (H) 0.3 - 1.2 mg/dL   Bilirubin, Direct 2.3 (H) 0.0 - 0.2 mg/dL   Indirect Bilirubin 1.6 (H) 0.3 - 0.9 mg/dL    Comment: Performed at [facility] [address] [place], [place] [address]  Lipase, blood     Status: None   Collection Time: 01/13/18  3:51 AM  Result Value Ref Range   Lipase 35 11 - 51 U/L    Comment: Performed at [facility], [place] [address], [place], [place] [address]  Basic metabolic panel     Status: Abnormal   Collection Time: 01/13/18  3:51 AM  Result Value Ref Range   Sodium 137 135 - 145 mmol/L   Potassium 4.1 3.5 - 5.1 mmol/L   Chloride 100 98 - 111 mmol/L   CO2 23 22 - 32 mmol/L   Glucose, Bld 142 (H) 70 - 99 mg/dL   BUN 21 8 - 23 mg/dL   Creatinine, Ser 2.80 (H) 0.61 - 1.24 mg/dL   Calcium 8.4 (L) 8.9 - 10.3 mg/dL   GFR calc non Af Amer 22 (L) >60 mL/min   GFR calc Af Amer 26 (L) >60 mL/min    Comment: (NOTE) The eGFR has been calculated using the CKD EPI equation. This calculation has not been validated in all clinical situations. eGFR's persistently <60 mL/min signify possible Chronic Kidney Disease.    Anion gap 14 5 - 15    Comment: Performed at [facility] [address] [place], [place] [address]  Glucose, capillary     Status: Abnormal   Collection Time: 01/13/18  7:49 AM  Result Value Ref Range   Glucose-Capillary 135 (H) 70 - 99 mg/dL   Comment 1 Capillary Specimen   Glucose, capillary     Status: Abnormal   Collection Time: 01/13/18 12:08 PM  Result Value Ref Range   Glucose-Capillary 126 (H) 70 - 99 mg/dL   Comment 1 Arterial Specimen   Glucose, capillary     Status: Abnormal   Collection Time: 01/13/18  3:59 PM  Result Value Ref Range   Glucose-Capillary 108 (H) 70 - 99 mg/dL   Comment 1 Capillary Specimen   Renal function panel (daily at 1600)     Status: Abnormal   Collection Time: 01/13/18  4:43 PM   Result Value Ref Range   Sodium 138 135 - 145 mmol/L   Potassium 3.9 3.5 - 5.1 mmol/L   Chloride 100 98 - 111 mmol/L   CO2 25 22 - 32 mmol/L   Glucose, Bld 120 (H) 70 - 99 mg/dL   BUN 21 8 - 23 mg/dL   Creatinine, Ser 2.70 (H) 0.61 - 1.24 mg/dL   Calcium 8.8 (L) 8.9 - 10.3 mg/dL   Phosphorus 3.4 2.5 - 4.6 mg/dL   Albumin 2.8 (L) 3.5 - 5.0 g/dL   GFR calc non Af Amer 23 (L) >60 mL/min   GFR calc Af Amer 27 (L) >60 mL/min    Comment: (NOTE) The eGFR has been calculated using the CKD EPI equation. This calculation has not been validated in all clinical situations. eGFR's persistently <60 mL/min signify possible Chronic Kidney Disease.    Anion gap 13 5 - 15    Comment: Performed at [facility] [address], [place], [place] [address]  Glucose, capillary     Status: Abnormal   Collection Time: 01/13/18  8:16 PM  Result Value Ref Range  Glucose-Capillary 125 (H) 70 - 99 mg/dL   Comment 1 Capillary Specimen   Glucose, capillary     Status: Abnormal   Collection Time: 01/13/18 11:48 PM  Result Value Ref Range   Glucose-Capillary 123 (H) 70 - 99 mg/dL   Comment 1 Capillary Specimen   CBC     Status: Abnormal   Collection Time: 01/14/18  3:48 AM  Result Value Ref Range   WBC 14.0 (H) 4.0 - 10.5 K/uL   RBC 2.91 (L) 4.22 - 5.81 MIL/uL   Hemoglobin 8.6 (L) 13.0 - 17.0 g/dL   HCT 27.3 (L) 39.0 - 52.0 %   MCV 93.8 78.0 - 100.0 fL   MCH 29.6 26.0 - 34.0 pg   MCHC 31.5 30.0 - 36.0 g/dL   RDW 16.7 (H) 11.5 - 15.5 %   Platelets 164 150 - 400 K/uL    Comment: Performed at [facility], [place] [address] [place], [place] [address]  Comprehensive metabolic panel     Status: Abnormal   Collection Time: 01/14/18  3:48 AM  Result Value Ref Range   Sodium 140 135 - 145 mmol/L   Potassium 4.0 3.5 - 5.1 mmol/L   Chloride 102 98 - 111 mmol/L   CO2 23 22 - 32 mmol/L   Glucose, Bld 142 (H) 70 - 99 mg/dL   BUN 32 (H) 8 - 23 mg/dL   Creatinine, Ser 3.75 (H) 0.61 - 1.24 mg/dL     Comment: DELTA CHECK NOTED   Calcium 9.0 8.9 - 10.3 mg/dL   Total Protein 6.4 (L) 6.5 - 8.1 g/dL   Albumin 2.7 (L) 3.5 - 5.0 g/dL   AST 565 (H) 15 - 41 U/L   ALT 2,540 (H) 0 - 44 U/L    Comment: RESULTS CONFIRMED BY MANUAL DILUTION   Alkaline Phosphatase 193 (H) 38 - 126 U/L   Total Bilirubin 3.9 (H) 0.3 - 1.2 mg/dL   GFR calc non Af Amer 16 (L) >60 mL/min   GFR calc Af Amer 18 (L) >60 mL/min    Comment: (NOTE) The eGFR has been calculated using the CKD EPI equation. This calculation has not been validated in all clinical situations. eGFR's persistently <60 mL/min signify possible Chronic Kidney Disease.    Anion gap 15 5 - 15    Comment: Performed at [facility] [address], [place], [place] [address]  Magnesium     Status: Abnormal   Collection Time: 01/14/18  3:48 AM  Result Value Ref Range   Magnesium 2.7 (H) 1.7 - 2.4 mg/dL    Comment: Performed at [facility] [address] [place], [place] [address]  Protime-INR     Status: Abnormal   Collection Time: 01/14/18  3:48 AM  Result Value Ref Range   Prothrombin Time 22.9 (H) 11.4 - 15.2 seconds   INR 2.05     Comment: Performed at [facility] [address], [place], [place] [address]  Glucose, capillary     Status: Abnormal   Collection Time: 01/14/18  3:50 AM  Result Value Ref Range   Glucose-Capillary 126 (H) 70 - 99 mg/dL   Comment 1 Capillary Specimen   Glucose, capillary     Status: Abnormal   Collection Time: 01/14/18  7:27 AM  Result Value Ref Range   Glucose-Capillary 161 (H) 70 - 99 mg/dL   Comment 1 Capillary Specimen     ECG   N/A  Telemetry  Atrial fib with RBBB- Personally Reviewed  Radiology    Dg Chest Port 1 View  Result Date: 01/13/2018 CLINICAL DATA:  Hemodialysis catheter malfunction. A new line has been placed. EXAM: PORTABLE CHEST 1 VIEW COMPARISON:  January 13, 2018 FINDINGS: The new right dialysis catheter terminates near the caval atrial junction. No  pneumothorax. A left central line terminates in the central SVC. Mild edema is stable. More focal opacity and possible small effusion on the left is stable. Stable cardiomediastinal silhouette. IMPRESSION: 1. The new right central line terminates near the caval atrial junction without pneumothorax. Left central line is stable. 2. Persistent edema. 3. Persistent focal opacity in the left base with possible small associated effusion. Electronically Signed   By: Dorise Bullion III M.D   On: 01/13/2018 07:45   Dg Chest Port 1 View  Result Date: 01/13/2018 CLINICAL DATA:  Patient pulled out central line. Check placement. EXAM: PORTABLE CHEST 1 VIEW COMPARISON:  01/12/2018 FINDINGS: Right central venous catheter has been withdrawn since the previous study. Tip is now projected over the right clavicular head, likely in the junction of the subclavian and jugular vein region. Advancement is suggested for better positioning. Left central venous catheter tip remains at the cavoatrial junction level. No pneumothorax. Shallow inspiration. Heart size and pulmonary vascularity are enlarged. Mild bilateral perihilar infiltrations may represent edema or pneumonia. Probable consolidation or atelectasis in the left lung base behind the heart. Calcification of the aorta. IMPRESSION: Right central venous catheter tip now projects over the expected location of the junction of the subclavian and jugular vein region. Persistent bilateral perihilar infiltrates. Cardiac enlargement. Consolidation or atelectasis in the left lung base behind the heart. Electronically Signed   By: Lucienne Capers [name]   On: 01/13/2018 06:25    Cardiac Studies   N/A  Assessment   Active Problems:   Hypotension   Sepsis (HCC)   Pericardial effusion with cardiac tamponade   Acute respiratory failure with hypoxia (HCC)   Septic shock (HCC)   Cholecystitis   Metabolic acidosis   Elevated LFTs   Idiopathic acute pancreatitis without infection  or necrosis   Plan   Plan for limited echo today - BP low, but stable with MAP in high 60's - may have HD today. Not at a point to restart HF medications.  Time Spent Directly with Patient:  I have spent a total of 25 minutes with the patient reviewing hospital notes, telemetry, EKGs, labs and examining the patient as well as establishing an assessment and plan that was discussed personally with the patient.  > 50% of time was spent in direct patient care.  Length of Stay:  LOS: 5 days   Pixie Casino, MD, [facility], [place] Director of the Advanced Lipid Disorders &  Cardiovascular Risk Reduction Clinic Diplomate of the American Board of Clinical Lipidology Attending Cardiologist  Direct Dial: [phone]  Fax: [phone]  Website:  [email or web address] 01/14/2018, 8:53 AM

## 2018-01-15 DIAGNOSIS — I483 Typical atrial flutter: Secondary | ICD-10-CM

## 2018-01-15 DIAGNOSIS — G928 Other toxic encephalopathy: Secondary | ICD-10-CM

## 2018-01-15 DIAGNOSIS — G92 Toxic encephalopathy: Secondary | ICD-10-CM

## 2018-01-15 LAB — RENAL FUNCTION PANEL
Albumin: 2.5 g/dL — ABNORMAL LOW (ref 3.5–5.0)
Albumin: 2.5 g/dL — ABNORMAL LOW (ref 3.5–5.0)
Anion gap: 13 (ref 5–15)
Anion gap: 12 (ref 5–15)
BUN: 54 mg/dL — ABNORMAL HIGH (ref 8–23)
BUN: 35 mg/dL — ABNORMAL HIGH (ref 8–23)
CO2: 24 mmol/L (ref 22–32)
CO2: 26 mmol/L (ref 22–32)
Calcium: 9.6 mg/dL (ref 8.9–10.3)
Calcium: 8.9 mg/dL (ref 8.9–10.3)
Chloride: 104 mmol/L (ref 98–111)
Chloride: 100 mmol/L (ref 98–111)
Creatinine, Ser: 5.82 mg/dL — ABNORMAL HIGH (ref 0.61–1.24)
Creatinine, Ser: 4.36 mg/dL — ABNORMAL HIGH (ref 0.61–1.24)
GFR calc Af Amer: 11 mL/min — ABNORMAL LOW (ref >60)
GFR calc Af Amer: 15 mL/min — ABNORMAL LOW (ref >60)
GFR calc non Af Amer: 9 mL/min — ABNORMAL LOW (ref >60)
GFR calc non Af Amer: 13 mL/min — ABNORMAL LOW (ref >60)
Glucose, Bld: 172 mg/dL — ABNORMAL HIGH (ref 70–99)
Glucose, Bld: 197 mg/dL — ABNORMAL HIGH (ref 70–99)
Phosphorus: 3.6 mg/dL (ref 2.5–4.6)
Phosphorus: 2.6 mg/dL (ref 2.5–4.6)
Potassium: 3.6 mmol/L (ref 3.5–5.1)
Potassium: 2.9 mmol/L — ABNORMAL LOW (ref 3.5–5.1)
Sodium: 141 mmol/L (ref 135–145)
Sodium: 138 mmol/L (ref 135–145)

## 2018-01-15 LAB — CULTURE, BLOOD (ROUTINE X 2)
Culture: NO GROWTH
Culture: NO GROWTH
Special Requests: ADEQUATE
Special Requests: ADEQUATE

## 2018-01-15 LAB — CBC
HCT: 27.3 % — ABNORMAL LOW (ref 39.0–52.0)
Hemoglobin: 8.8 g/dL — ABNORMAL LOW (ref 13.0–17.0)
MCH: 29.5 pg (ref 26.0–34.0)
MCHC: 32.2 g/dL (ref 30.0–36.0)
MCV: 91.6 fL (ref 78.0–100.0)
Platelets: 191 10*3/uL (ref 150–400)
RBC: 2.98 MIL/uL — ABNORMAL LOW (ref 4.22–5.81)
RDW: 16.8 % — ABNORMAL HIGH (ref 11.5–15.5)
WBC: 18.4 10*3/uL — ABNORMAL HIGH (ref 4.0–10.5)

## 2018-01-15 LAB — GLUCOSE, CAPILLARY
Glucose-Capillary: 193 mg/dL — ABNORMAL HIGH (ref 70–99)
Glucose-Capillary: 158 mg/dL — ABNORMAL HIGH (ref 70–99)
Glucose-Capillary: 281 mg/dL — ABNORMAL HIGH (ref 70–99)
Glucose-Capillary: 183 mg/dL — ABNORMAL HIGH (ref 70–99)
Glucose-Capillary: 144 mg/dL — ABNORMAL HIGH (ref 70–99)
Glucose-Capillary: 166 mg/dL — ABNORMAL HIGH (ref 70–99)

## 2018-01-15 LAB — PROTIME-INR
INR: 2.2
Prothrombin Time: 24.3 seconds — ABNORMAL HIGH (ref 11.4–15.2)

## 2018-01-15 MED ORDER — LORAZEPAM 2 MG/ML IJ SOLN
INTRAMUSCULAR | Status: AC
  Filled 2018-01-15: qty 1

## 2018-01-15 NOTE — Progress Notes (Signed)
DAILY PROGRESS NOTE   Patient Name: [name] Date of Encounter: 01/15/2018   Patient Profile   67 yo male with shock, found to have a uremic pericardial effusion and tamponade, s/p pericardial window, now with ongoing hypotension and known systolic CHF - EF 93%. Also noted to be in Atrial flutter with rapid conduction   Subjective   confusted but denies chest pain or sob  Objective   Vitals:   01/15/18 0700 01/15/18 0800 01/15/18 0807 01/15/18 0900  BP: 134/87 125/75    Pulse: (!) 114 83  99  Resp: (!) '21 18  17  ' Temp:   98.2 F (36.8 C)   TempSrc:   Oral   SpO2: 96% 98%  100%  Weight:      Height:        Intake/Output Summary (Last 24 hours) at 01/15/2018 8101 Last data filed at 01/15/2018 0800 Gross per 24 hour  Intake 474 ml  Output 0 ml  Net 474 ml   Filed Weights   01/13/18 0412 01/14/18 0345 01/15/18 0500  Weight: 108 kg 106.9 kg 109 kg    Physical Exam  Well developed and nourished in no acute distress but confused  Falling asleep  HENT normal Neck supple with line in place  Clear Regular rate and rhythm scratchy m Abd-soft with active BS No Clubbing cyanosis edema Skin-warm and dry A & Oriented  Grossly normal sensory and motor function   Inpatient Medications    Scheduled Meds: . chlorhexidine  15 mL Mouth Rinse BID  . Chlorhexidine Gluconate Cloth  6 each Topical Daily  . Chlorhexidine Gluconate Cloth  6 each Topical Q0600  . feeding supplement (ENSURE ENLIVE)  237 mL Oral TID BM  . insulin aspart  2-6 Units Subcutaneous Q4H  . mouth rinse  15 mL Mouth Rinse q12n4p  . midodrine  5 mg Oral TID WC  . multivitamin  1 tablet Oral QHS  . sodium chloride flush  10-40 mL Intracatheter Q12H    Continuous Infusions: . sodium chloride Stopped (01/14/18 0624)  . norepinephrine (LEVOPHED) Adult infusion Stopped (01/14/18 0313)    PRN Meds: sodium chloride, fentaNYL (SUBLIMAZE) injection, LORazepam, ondansetron (ZOFRAN) IV, sodium chloride  flush   Labs   Results for orders placed or performed during the hospital encounter of 01/09/18 (from the past 48 hour(s))  Glucose, capillary     Status: Abnormal   Collection Time: 01/13/18 12:08 PM  Result Value Ref Range   Glucose-Capillary 126 (H) 70 - 99 mg/dL   Comment 1 Arterial Specimen   Glucose, capillary     Status: Abnormal   Collection Time: 01/13/18  3:59 PM  Result Value Ref Range   Glucose-Capillary 108 (H) 70 - 99 mg/dL   Comment 1 Capillary Specimen   Renal function panel (daily at 1600)     Status: Abnormal   Collection Time: 01/13/18  4:43 PM  Result Value Ref Range   Sodium 138 135 - 145 mmol/L   Potassium 3.9 3.5 - 5.1 mmol/L   Chloride 100 98 - 111 mmol/L   CO2 25 22 - 32 mmol/L   Glucose, Bld 120 (H) 70 - 99 mg/dL   BUN 21 8 - 23 mg/dL   Creatinine, Ser 2.70 (H) 0.61 - 1.24 mg/dL   Calcium 8.8 (L) 8.9 - 10.3 mg/dL   Phosphorus 3.4 2.5 - 4.6 mg/dL   Albumin 2.8 (L) 3.5 - 5.0 g/dL   GFR calc non Af Amer 23 (L) >  60 mL/min   GFR calc Af Amer 27 (L) >60 mL/min    Comment: (NOTE) The eGFR has been calculated using the CKD EPI equation. This calculation has not been validated in all clinical situations. eGFR's persistently <60 mL/min signify possible Chronic Kidney Disease.    Anion gap 13 5 - 15    Comment: Performed at [facility] [address], [place], [place] [address]  Glucose, capillary     Status: Abnormal   Collection Time: 01/13/18  8:16 PM  Result Value Ref Range   Glucose-Capillary 125 (H) 70 - 99 mg/dL   Comment 1 Capillary Specimen   Glucose, capillary     Status: Abnormal   Collection Time: 01/13/18 11:48 PM  Result Value Ref Range   Glucose-Capillary 123 (H) 70 - 99 mg/dL   Comment 1 Capillary Specimen   CBC     Status: Abnormal   Collection Time: 01/14/18  3:48 AM  Result Value Ref Range   WBC 14.0 (H) 4.0 - 10.5 K/uL   RBC 2.91 (L) 4.22 - 5.81 MIL/uL   Hemoglobin 8.6 (L) 13.0 - 17.0 g/dL   HCT 27.3 (L) 39.0 - 52.0 %    MCV 93.8 78.0 - 100.0 fL   MCH 29.6 26.0 - 34.0 pg   MCHC 31.5 30.0 - 36.0 g/dL   RDW 16.7 (H) 11.5 - 15.5 %   Platelets 164 150 - 400 K/uL    Comment: Performed at [facility], [place] [address], [place], [place] [address]  Comprehensive metabolic panel     Status: Abnormal   Collection Time: 01/14/18  3:48 AM  Result Value Ref Range   Sodium 140 135 - 145 mmol/L   Potassium 4.0 3.5 - 5.1 mmol/L   Chloride 102 98 - 111 mmol/L   CO2 23 22 - 32 mmol/L   Glucose, Bld 142 (H) 70 - 99 mg/dL   BUN 32 (H) 8 - 23 mg/dL   Creatinine, Ser 3.75 (H) 0.61 - 1.24 mg/dL    Comment: DELTA CHECK NOTED   Calcium 9.0 8.9 - 10.3 mg/dL   Total Protein 6.4 (L) 6.5 - 8.1 g/dL   Albumin 2.7 (L) 3.5 - 5.0 g/dL   AST 565 (H) 15 - 41 U/L   ALT 2,540 (H) 0 - 44 U/L    Comment: RESULTS CONFIRMED BY MANUAL DILUTION   Alkaline Phosphatase 193 (H) 38 - 126 U/L   Total Bilirubin 3.9 (H) 0.3 - 1.2 mg/dL   GFR calc non Af Amer 16 (L) >60 mL/min   GFR calc Af Amer 18 (L) >60 mL/min    Comment: (NOTE) The eGFR has been calculated using the CKD EPI equation. This calculation has not been validated in all clinical situations. eGFR's persistently <60 mL/min signify possible Chronic Kidney Disease.    Anion gap 15 5 - 15    Comment: Performed at [facility] [address], [place], [place] [address]  Magnesium     Status: Abnormal   Collection Time: 01/14/18  3:48 AM  Result Value Ref Range   Magnesium 2.7 (H) 1.7 - 2.4 mg/dL    Comment: Performed at [facility] [address] [place], [place] [address]  Protime-INR     Status: Abnormal   Collection Time: 01/14/18  3:48 AM  Result Value Ref Range   Prothrombin Time 22.9 (H) 11.4 - 15.2 seconds   INR 2.05     Comment: Performed at [facility], [place] [address] [place], [place] [address]  Glucose, capillary     Status: Abnormal   Collection Time: 01/14/18  3:50 AM  Result Value Ref Range   Glucose-Capillary 126 (H) 70 - 99 mg/dL    Comment 1 Capillary Specimen   Glucose, capillary     Status: Abnormal   Collection Time: 01/14/18  7:27 AM  Result Value Ref Range   Glucose-Capillary 161 (H) 70 - 99 mg/dL   Comment 1 Capillary Specimen   Glucose, capillary     Status: Abnormal   Collection Time: 01/14/18 11:57 AM  Result Value Ref Range   Glucose-Capillary 149 (H) 70 - 99 mg/dL   Comment 1 Capillary Specimen   Glucose, capillary     Status: Abnormal   Collection Time: 01/14/18  4:32 PM  Result Value Ref Range   Glucose-Capillary 176 (H) 70 - 99 mg/dL   Comment 1 Capillary Specimen   Renal function panel (daily at 1600)     Status: Abnormal   Collection Time: 01/14/18  4:56 PM  Result Value Ref Range   Sodium 138 135 - 145 mmol/L   Potassium 4.0 3.5 - 5.1 mmol/L   Chloride 102 98 - 111 mmol/L   CO2 23 22 - 32 mmol/L   Glucose, Bld 182 (H) 70 - 99 mg/dL   BUN 38 (H) 8 - 23 mg/dL   Creatinine, Ser 4.66 (H) 0.61 - 1.24 mg/dL   Calcium 9.1 8.9 - 10.3 mg/dL   Phosphorus 4.1 2.5 - 4.6 mg/dL   Albumin 2.6 (L) 3.5 - 5.0 g/dL   GFR calc non Af Amer 12 (L) >60 mL/min   GFR calc Af Amer 14 (L) >60 mL/min    Comment: (NOTE) The eGFR has been calculated using the CKD EPI equation. This calculation has not been validated in all clinical situations. eGFR's persistently <60 mL/min signify possible Chronic Kidney Disease.    Anion gap 13 5 - 15    Comment: Performed at [facility] [address], [place], [place] [address]  Glucose, capillary     Status: Abnormal   Collection Time: 01/14/18  7:34 PM  Result Value Ref Range   Glucose-Capillary 136 (H) 70 - 99 mg/dL   Comment 1 Capillary Specimen   Glucose, capillary     Status: Abnormal   Collection Time: 01/14/18 11:12 PM  Result Value Ref Range   Glucose-Capillary 232 (H) 70 - 99 mg/dL   Comment 1 Capillary Specimen   Glucose, capillary     Status: Abnormal   Collection Time: 01/15/18  3:13 AM  Result Value Ref Range   Glucose-Capillary 193 (H) 70 - 99  mg/dL   Comment 1 Capillary Specimen   Protime-INR     Status: Abnormal   Collection Time: 01/15/18  3:32 AM  Result Value Ref Range   Prothrombin Time 24.3 (H) 11.4 - 15.2 seconds   INR 2.20     Comment: Performed at [facility], [place] [address], [place], [place] [address]  Renal function panel     Status: Abnormal   Collection Time: 01/15/18  7:02 AM  Result Value Ref Range   Sodium 141 135 - 145 mmol/L   Potassium 3.6 3.5 - 5.1 mmol/L   Chloride 104 98 - 111 mmol/L   CO2 24 22 - 32 mmol/L   Glucose, Bld 172 (H) 70 - 99 mg/dL   BUN 54 (H) 8 - 23 mg/dL   Creatinine, Ser 5.82 (  H) 0.61 - 1.24 mg/dL   Calcium 9.6 8.9 - 10.3 mg/dL   Phosphorus 3.6 2.5 - 4.6 mg/dL   Albumin 2.5 (L) 3.5 - 5.0 g/dL   GFR calc non Af Amer 9 (L) >60 mL/min   GFR calc Af Amer 11 (L) >60 mL/min    Comment: (NOTE) The eGFR has been calculated using the CKD EPI equation. This calculation has not been validated in all clinical situations. eGFR's persistently <60 mL/min signify possible Chronic Kidney Disease.    Anion gap 13 5 - 15    Comment: Performed at [facility] [address] [place], [place] [address]  CBC     Status: Abnormal   Collection Time: 01/15/18  7:02 AM  Result Value Ref Range   WBC 18.4 (H) 4.0 - 10.5 K/uL   RBC 2.98 (L) 4.22 - 5.81 MIL/uL   Hemoglobin 8.8 (L) 13.0 - 17.0 g/dL   HCT 27.3 (L) 39.0 - 52.0 %   MCV 91.6 78.0 - 100.0 fL   MCH 29.5 26.0 - 34.0 pg   MCHC 32.2 30.0 - 36.0 g/dL   RDW 16.8 (H) 11.5 - 15.5 %   Platelets 191 150 - 400 K/uL    Comment: Performed at [facility], [place] [address] [place], [place] [address]  Glucose, capillary     Status: Abnormal   Collection Time: 01/15/18  8:03 AM  Result Value Ref Range   Glucose-Capillary 158 (H) 70 - 99 mg/dL   Comment 1 Notify RN     ECG   N/A  Telemetry   Aflutter  With variable conduction Personally reviewed   Interestingly some wide complex beats and changes in fmorphology, for which dont  have an explanation, ie not short long aberration   Radiology    No results found.  Cardiac Studies   N/A  Assessment     Tamponade with uremic pericardial effusion s/p window  Atrial flutter with variable conduction   Cardiomyopathy chronic  ESRD  Hypotension  DVT on chronic anticoagulation        Plan   No sure there is much to do from cardiology point of view.  His effusion precludes resuming anticoagulation just yet  His atrial flutter might be ablatable if ongoing problem  He has seen WC in past  For now will plan to see again Monday unless intervening issues  Please call   Virl Axe 01/15/2018, 9:03 AM

## 2018-01-15 NOTE — Progress Notes (Signed)
RenalAssessment:  1ESRD- 2s/p pericardial effusion drainage 3 RUE AVFnow functioning with improved BP 5 Shock, resolved 6 coagulopathy/a fib 7 pos fluid balance  Plan: Hemodialysis today  Subjective: Interval History: No complaints  Objective: Vital signs in last 24 hours: Temp:  [97.7 F (36.5 C)-99.5 F (37.5 C)] 98.2 F (36.8 C) (08/17 0807) Pulse Rate:  [71-126] 99 (08/17 0900) Resp:  [11-28] 17 (08/17 0900) BP: (94-154)/(60-97) 109/62 (08/17 0900) SpO2:  [95 %-100 %] 100 % (08/17 0900) Weight:  [109 kg] 109 kg (08/17 0500) Weight change: 2.1 kg  Intake/Output from previous day: 08/16 0701 - 08/17 0700 In: 237 [P.O.:237] Out: 0  Intake/Output this shift: Total I/O In: 237 [P.O.:237] Out: -   General appearance: alert Resp: clear to auscultation bilaterally Cardio: regular rate and rhythm, S1, S2 normal, no murmur, click, rub or gallop Extremities: edema 1+  RUE AVF thrill  Lab Results: Recent Labs    01/14/18 0348 01/15/18 0702  WBC 14.0* 18.4*  HGB 8.6* 8.8*  HCT 27.3* 27.3*  PLT 164 191   BMET:  Recent Labs    01/14/18 1656 01/15/18 0702  NA 138 141  K 4.0 3.6  CL 102 104  CO2 23 24  GLUCOSE 182* 172*  BUN 38* 54*  CREATININE 4.66* 5.82*  CALCIUM 9.1 9.6   No results for input(s): PTH in the last 72 hours. Iron Studies: No results for input(s): IRON, TIBC, TRANSFERRIN, FERRITIN in the last 72 hours. Studies/Results: No results found.  Scheduled: . chlorhexidine  15 mL Mouth Rinse BID  . Chlorhexidine Gluconate Cloth  6 each Topical Daily  . Chlorhexidine Gluconate Cloth  6 each Topical Q0600  . feeding supplement (ENSURE ENLIVE)  237 mL Oral TID BM  . insulin aspart  2-6 Units Subcutaneous Q4H  . mouth rinse  15 mL Mouth Rinse q12n4p  . midodrine  5 mg Oral TID WC  . multivitamin  1 tablet Oral QHS  . sodium chloride flush  10-40 mL Intracatheter Q12H    LOS: 6 days   [name] 01/15/2018,10:00 AM

## 2018-01-15 NOTE — Progress Notes (Signed)
Patient transferred to hemodialysis on monitor with transporter and RN.  Angelito RN received patient.  RN made aware of blood glucose being out of sliding scale range and that this RN was awaiting call from MD regarding further orders.

## 2018-01-15 NOTE — Evaluation (Signed)
Physical Therapy Evaluation Patient Details Name: [name] MRN: [id number] DOB: [birth year] Today's Date: 01/15/2018   History of Present Illness  Pt is a 67 y/o male admitted secondary to shock, found to have a uremic pericardial effusion and tamponade, s/p pericardial window, now with ongoing hypotension and known systolic CHF - EF 46%. Pt also with acute hypoxic respiratory failure with LLL PNA. PMH including but not limited to CKD, HTN and DM.  Clinical Impression  Pt presented supine in bed with HOB elevated, awake and willing to participate in therapy session. Prior to admission, pt reported that he lives alone and ambulates with RW. Pt was a poor historian with impaired cognition and no family/caregivers present to confirm any information. Pt currently requires max A to total A x2 for bed mobility and transfers. Pt on 2L of supplemental O2 via [place] with VSS throughout. Pt would continue to benefit from skilled physical therapy services at this time while admitted and after d/c to address the below listed limitations in order to improve overall safety and independence with functional mobility.     Follow Up Recommendations SNF    Equipment Recommendations  None recommended by PT    Recommendations for Other Services       Precautions / Restrictions Precautions Precautions: Fall Precaution Comments: L IJ dialysis cath Restrictions Weight Bearing Restrictions: No      Mobility  Bed Mobility Overal bed mobility: Needs Assistance Bed Mobility: Supine to Sit;Sit to Supine     Supine to sit: Max assist;+2 for physical assistance Sit to supine: Total assist;+2 for physical assistance   General bed mobility comments: increased time and effort, tactile cueing for sequencing, assist with LEs off of and onto bed, heavy physical assistance with trunk  Transfers Overall transfer level: Needs assistance Equipment used: 2 person hand held assist Transfers: Sit to/from Stand Sit  to Stand: Max assist;+2 physical assistance         General transfer comment: pt performed x2 from EOB with max A x2 with therapist blocking L knee from buckling  Ambulation/Gait                Stairs            Wheelchair Mobility    Modified Rankin (Stroke Patients Only)       Balance Overall balance assessment: Needs assistance Sitting-balance support: Feet supported;Bilateral upper extremity supported Sitting balance-Leahy Scale: Poor Sitting balance - Comments: fluctuating from requiring min A to total A at trunk with posterior lean Postural control: Posterior lean Standing balance support: During functional activity;Bilateral upper extremity supported Standing balance-Leahy Scale: Poor Standing balance comment: max A x2                             Pertinent Vitals/Pain Pain Assessment: No/denies pain    Home Living Family/patient expects to be discharged to:: Unsure                 Additional Comments: Pt poor historian and no other family/caregivers present to provide any information; pt reported that he lives alone and uses a RW to ambulate (unable to obtain any additional info)    Prior Function           Comments: Pt poor historian and no other family/caregivers present to provide any information; pt reported that he lives alone and uses a RW to ambulate (unable to obtain any additional info)  Hand Dominance        Extremity/Trunk Assessment   Upper Extremity Assessment Upper Extremity Assessment: Generalized weakness    Lower Extremity Assessment Lower Extremity Assessment: Generalized weakness       Communication   Communication: HOH  Cognition Arousal/Alertness: Awake/alert Behavior During Therapy: Flat affect Overall Cognitive Status: Impaired/Different from baseline Area of Impairment: Orientation;Attention;Memory;Following commands;Safety/judgement;Awareness;Problem solving                  Orientation Level: Disoriented to;Place;Time;Situation Current Attention Level: Focused Memory: Decreased short-term memory Following Commands: Follows one step commands inconsistently Safety/Judgement: Decreased awareness of deficits;Decreased awareness of safety Awareness: Intellectual Problem Solving: Slow processing;Decreased initiation;Difficulty sequencing;Requires verbal cues;Requires tactile cues General Comments: pt required loud auditory stimuli and firm tactile stimuli to respond throughout      General Comments      Exercises     Assessment/Plan    PT Assessment Patient needs continued PT services  PT Problem List Decreased strength;Decreased activity tolerance;Decreased balance;Decreased mobility;Decreased coordination;Decreased cognition;Decreased knowledge of use of DME;Decreased safety awareness;Decreased knowledge of precautions       PT Treatment Interventions DME instruction;Gait training;Functional mobility training;Therapeutic activities;Stair training;Balance training;Neuromuscular re-education;Therapeutic exercise;Patient/family education;Cognitive remediation    PT Goals (Current goals can be found in the Care Plan section)  Acute Rehab PT Goals Patient Stated Goal: unable to state PT Goal Formulation: Patient unable to participate in goal setting Time For Goal Achievement: 01/29/18 Potential to Achieve Goals: Fair    Frequency Min 2X/week   Barriers to discharge        Co-evaluation               AM-PAC PT "6 Clicks" Daily Activity  Outcome Measure Difficulty turning over in bed (including adjusting bedclothes, sheets and blankets)?: Unable Difficulty moving from lying on back to sitting on the side of the bed? : Unable Difficulty sitting down on and standing up from a chair with arms (e.g., wheelchair, bedside commode, etc,.)?: Unable Help needed moving to and from a bed to chair (including a wheelchair)?: A Lot Help needed walking in  hospital room?: Total Help needed climbing 3-5 steps with a railing? : Total 6 Click Score: 7    End of Session Equipment Utilized During Treatment: Gait belt Activity Tolerance: Patient limited by fatigue Patient left: in bed;with call bell/phone within reach;with bed alarm set;with restraints reapplied;with SCD's reapplied Nurse Communication: Mobility status PT Visit Diagnosis: Other abnormalities of gait and mobility (R26.89);Muscle weakness (generalized) (M62.81)    Time: 7412-8786 PT Time Calculation (min) (ACUTE ONLY): 23 min   Charges:   PT Evaluation $PT Eval Moderate Complexity: 1 Mod PT Treatments $Therapeutic Activity: 8-22 mins        [place], [place], [place] 01/15/2018, 12:25 PM

## 2018-01-15 NOTE — Progress Notes (Signed)
PULMONARY / CRITICAL CARE MEDICINE   Name: [name] MRN: [id number] DOB: 11/08/1950    ADMISSION DATE:  01/09/2018 CONSULTATION DATE: 01/09/18  REFERRING MD: Transfer from [place]: Shock   HISTORY OF PRESENT ILLNESS:   67yoM with hx ESRD on HD (last HD on 01/08/18), OSA, PVD, NHL (2009), DM, HTN, GERD, Anemia, CHF (EF 20%), LE DVT (remote 35yrs ago per pt) on Coumadin.  Transferred from [facility] he had presented with generalized malaise and chest pain.  Found to be hypotensive.  Echocardiogram subsequently showed large pericardial effusion with evidence of cardiac tamponade.  Hemodynamics have improved since drainage via a pericardial window.  The patient was successfully extubated 8/14.    SUBJECTIVE:  Somewhat somnolent but responds to verbal / recognizes uncle  No distress since extubated 8/14   VITAL SIGNS: BP (!) 106/53   Pulse 96   Temp 97.9 F (36.6 C) (Oral)   Resp 16   Ht 6\' 3"  (1.905 m)   Wt 109 kg   SpO2 96%   BMI 30.04 kg/m     INTAKE / OUTPUT: I/O last 3 completed shifts: In: 369.3 [P.O.:237; I.V.:32.3; IV Piggyback:100] Out: 0   PHYSICAL EXAMINATION: Chronically ill appearing nad on 2lpm    No jvd Oropharynx clear Neck supple Lungs with a few scattered exp > insp rhonchi bilaterally RRR no s3 or or sign murmur Abd soft  Extr wam with no edema or clubbing noted Neuro  Moves all 4 to request      LABS:  BMET Recent Labs  Lab 01/14/18 0348 01/14/18 1656 01/15/18 0702  NA 140 138 141  K 4.0 4.0 3.6  CL 102 102 104  CO2 23 23 24   BUN 32* 38* 54*  CREATININE 3.75* 4.66* 5.82*  GLUCOSE 142* 182* 172*   Electrolytes Recent Labs  Lab 01/11/18 0008 01/11/18 0412  01/13/18 1643 01/14/18 0348 01/14/18 1656 01/15/18 0702  CALCIUM  --  8.0*   < > 8.8* 9.0 9.1 9.6  MG 2.2 2.2  --   --  2.7*  --   --   PHOS 8.1*  --    < > 3.4  --  4.1 3.6   < > = values in this interval not displayed.    CBC Recent Labs  Lab 01/13/18 0351 01/14/18 0348 01/15/18 0702  WBC 15.3* 14.0* 18.4*  HGB 9.4* 8.6* 8.8*  HCT 30.1* 27.3* 27.3*  PLT 193 164 191   Coag's Recent Labs  Lab 01/13/18 0351 01/14/18 0348 01/15/18 0332  INR 1.73 2.05 2.20   Sepsis Markers Recent Labs  Lab 01/09/18 1532  01/10/18 0354 01/10/18 0713 01/11/18 0005 01/11/18 0008 01/11/18 0305  LATICACIDVEN  --    < > 10.9* 12.7* 10.8*  --  8.5*  PROCALCITON 1.44  --  3.04  --   --  21.21  --    < > = values in this interval not displayed.   ABG Recent Labs  Lab 01/11/18 0345 01/11/18 0937 01/12/18 0858  PHART 7.406 7.429 7.515*  PCO2ART 34.5 36.7 34.5  PO2ART 204.0* 42.0* 223.0*    Liver Enzymes Recent Labs  Lab 01/12/18 0346  01/13/18 0351  01/14/18 0348 01/14/18 1656 01/15/18 0702  AST 4,778*  --  1,788*  --  565*  --   --   ALT 5,783*  --  4,017*  --  2,540*  --   --   ALKPHOS 196*  --  208*  --  193*  --   --   BILITOT 3.5*  --  3.9*  --  3.9*  --   --   ALBUMIN 3.0*   < > 3.0*   < > 2.7* 2.6* 2.5*   < > = values in this interval not displayed.   Cardiac Enzymes Recent Labs  Lab 01/09/18 2217 01/10/18 0354 01/11/18 0412  TROPONINI 0.11* 0.33* 2.35*    Glucose Recent Labs  Lab 01/14/18 1632 01/14/18 1934 01/14/18 2312 01/15/18 0313 01/15/18 0803 01/15/18 1144  GLUCAP 176* 136* 232* 193* 158* 281*   Imaging No results found.   STUDIES:  CT Abdomen/Pelvis w IV contrast 8/12 >> cholelithiasis with gallbladder wall thickening, pericholecystic edema suggestive of cholecystitis, infiltration in the peripancreatic fat consistent with acute pancreatitis, no loculated collections, diffuse fatty infiltration of the liver, small left pleural effusion, consolidation in the left lung base, large pericardial effusion, small periumbilical hernia containing fat  CULTURES: BCx2 8/11>>  Neg  BC x 1 8/12 > neg   Pericardial Fluid 8/12 >> neg   ANTIBIOTICS: Vanc 8/11 >> 8/14 Zosyn  8/11 > 8/15   SIGNIFICANT EVENTS: 8/11 Presented to [facility] ER with abd pain, shock, N/V >> diagnosed with pneumonia and transferred to [facility] ICU 8/12  Patient decompensated overnight, intubated > to OR for pericardial window.   8/13  Pauses in early am, CVVHD started.  Levophed 34 mcg's, vasopressin  8/15 all  Vasoactives off  LINES/TUBES: RUE AV fistula LIJ TLC 8/12 >> R IJ HD cath 8/13>>> ET d/c   8/14   DISCUSSION: 67 year old man with ESRD with cardiogenic shock secondary to tamponade.  Now resolved post pericardial window.  Ongoing issues of delirium  ASSESSMENT / PLAN:  Acute Hypoxic Respiratory Failure-improved.  Extubated 8/14 S/p LLL Pneumonia P: Mobilize as tol / wean 02 off for sat > 94%      Shock - suspect cardiogenic as primary, +/- septic component  Hemorrhagic Tamponade - INR >10 on admit -n  S/P pericardial window 8/12 AFib RVR (new diagnosis) - converted 8/13 Hx HTN and CHF (EF 20%) QT prolongation P: Continue ICU monitoring-ensure the patient can tolerate conventional hemodialysis prior to transfer to stepdown. 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  Can resume warfarin.  Patient is currently therapeutic. SCDs   Shock - secondary to pericardial effusion now resolved. P: Stopped all antibiotics 8/15    DM P: SSI  CBG Q4  Acute Metabolic Encephalopathy - improving slowly P:  no change rx    FAMILY  - uncle at bedside/ updated am 8/17

## 2018-01-15 NOTE — Progress Notes (Signed)
      [address]       [place] [address]             [phone]        CARDIOTHORACIC SURGERY PROGRESS NOTE   R5 Days Post-Op Procedure(s) (LRB): SUBXYPHOID PERICARDIAL WINDOW (N/A)  Subjective: Awake and alert.  Oriented to name.  Follows commands but intermittently confused  Objective: Vital signs: BP Readings from Last 1 Encounters:  01/15/18 109/62   Pulse Readings from Last 1 Encounters:  01/15/18 99   Resp Readings from Last 1 Encounters:  01/15/18 17   Temp Readings from Last 1 Encounters:  01/15/18 98.2 F (36.8 C) (Oral)    Hemodynamics:    Physical Exam:  Rhythm:   sinus  Breath sounds: clear  Heart sounds:  RRR  Incisions:  Clean and dry  Abdomen:  Soft, non-distended, non-tender  Extremities:  Warm, well-perfused    Intake/Output from previous day: 08/16 0701 - 08/17 0700 In: 237 [P.O.:237] Out: 0  Intake/Output this shift: Total I/O In: 237 [P.O.:237] Out: -   Lab Results:  CBC: Recent Labs    01/14/18 0348 01/15/18 0702  WBC 14.0* 18.4*  HGB 8.6* 8.8*  HCT 27.3* 27.3*  PLT 164 191    BMET:  Recent Labs    01/14/18 1656 01/15/18 0702  NA 138 141  K 4.0 3.6  CL 102 104  CO2 23 24  GLUCOSE 182* 172*  BUN 38* 54*  CREATININE 4.66* 5.82*  CALCIUM 9.1 9.6     PT/INR:   Recent Labs    01/15/18 0332  LABPROT 24.3*  INR 2.20    CBG (last 3)  Recent Labs    01/14/18 2312 01/15/18 0313 01/15/18 0803  GLUCAP 232* 193* 158*    ABG    Component Value Date/Time   PHART 7.515 (H) 01/12/2018 0858   PCO2ART 34.5 01/12/2018 0858   PO2ART 223.0 (H) 01/12/2018 0858   HCO3 27.9 01/12/2018 0858   TCO2 29 01/12/2018 0858   ACIDBASEDEF 2.0 01/11/2018 0345   O2SAT 100.0 01/12/2018 0858    CXR: n/a  Assessment/Plan: S/P Procedure(s) (LRB): SUBXYPHOID PERICARDIAL WINDOW (N/A)  Slowly improving Surgical incision looks good Plan per medical teams  Rexene Alberts, MD 01/15/2018 10:30 AM

## 2018-01-15 NOTE — Progress Notes (Signed)
ANTICOAGULATION CONSULT NOTE  Pharmacy Consult for Warfarin Management  Indication: History of DVT and pAF  No Known Allergies  Patient Measurements: Height: '6\' 3"'  (190.5 cm) Weight: 240 lb 4.8 oz (109 kg) IBW/kg (Calculated) : 84.5  Vital Signs: Temp: 98 F (36.7 C) (08/17 1151) Temp Source: Oral (08/17 1151) BP: 131/88 (08/17 1200) Pulse Rate: 98 (08/17 1200)  Labs: Recent Labs    01/13/18 0351  01/14/18 0348 01/14/18 1656 01/15/18 0332 01/15/18 0702  HGB 9.4*  --  8.6*  --   --  8.8*  HCT 30.1*  --  27.3*  --   --  27.3*  PLT 193  --  164  --   --  191  LABPROT 20.1*  --  22.9*  --  24.3*  --   INR 1.73  --  2.05  --  2.20  --   CREATININE 2.80*   < > 3.75* 4.66*  --  5.82*   < > = values in this interval not displayed.   Hepatic Function Latest Ref Rng & Units 01/15/2018 01/14/2018 01/14/2018  Total Protein 6.5 - 8.1 g/dL - - 6.4(L)  Albumin 3.5 - 5.0 g/dL 2.5(L) 2.6(L) 2.7(L)  AST 15 - 41 U/L - - 565(H)  ALT 0 - 44 U/L - - 2,540(H)  Alk Phosphatase 38 - 126 U/L - - 193(H)  Total Bilirubin 0.3 - 1.2 mg/dL - - 3.9(H)  Bilirubin, Direct 0.0 - 0.2 mg/dL - - -    Estimated Creatinine Clearance: 17.1 mL/min (A) (by C-G formula based on SCr of 5.82 mg/dL (H)).   Medical History: Past Medical History:  Diagnosis Date  . Anemia   . Arthritis    HNP- lumbar, "all over my body"  . Blood transfusion    "years ago; blood was low" (08/05/2013)  . CKD (chronic kidney disease) stage 4, GFR 15-29 ml/min (HCC) 03/18/2012   [place]- T,TH,Sat.  . Diabetic nephropathy ([place])   . Diabetic retinopathy   . DVT (deep venous thrombosis) ([place])    "got one in my right leg now; I've had one before too, not sure which leg" (08/05/2013)  . ESRD (end stage renal disease) on dialysis [facility])    "just started today, (08/04/2013)"  . Family history of anesthesia complication    " my son wakes up slowly"  . GERD (gastroesophageal reflux disease)    uses alka seltzere on  occas.   [name])    "one q now and then" (08/05/2013)  . Hyperlipidemia   . Hypertension   . 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 Non Hodgkin's lymphoma ([place])    Tx 2009; "had chemo; it went away" (08/05/2013)  . Noncompliance 03/16/2012  . NSVT (nonsustained ventricular tachycardia) ([place]) 03/18/2012  . Peripheral vascular disease ([place])   . Pneumonia 2013   hosp.-   . Poor historian    pt. unsure of several answers to health history questions   . Skin cancer    melanoma - head  . Sleep apnea    "suppose to have a sleep study, but they never told me when. (08/05/2013)    Medications:  Scheduled:  . chlorhexidine  15 mL Mouth Rinse BID  . Chlorhexidine Gluconate Cloth  6 each Topical Daily  . Chlorhexidine Gluconate Cloth  6 each Topical Q0600  . feeding supplement (ENSURE ENLIVE)  237 mL Oral TID BM  .  insulin aspart  2-6 Units Subcutaneous Q4H  . mouth rinse  15 mL Mouth Rinse q12n4p  . midodrine  5 mg Oral TID WC  . multivitamin  1 tablet Oral QHS  . sodium chloride flush  10-40 mL Intracatheter Q12H   Infusions:  . sodium chloride Stopped (01/14/18 0624)  . norepinephrine (LEVOPHED) Adult infusion Stopped (01/14/18 0313)    Assessment: Pt is a 54 yoM with a history of DVT (15 years ago per pt) receiving warfarin PTA. During admission, pt developed atrial fibrillation. Home warfarin dose is 7.5 mg daily except 10 mg Mon/Thurs. Warfarin has been held since admission secondary to elevated INR, peaked at 10 has downtrended. Of note, patient received 10 mg [place] phytonadione and a total of 20 mg IV phytonadione on 8/13.   Plan to restart warfarin lower than home dose given shock liver.  LFTs continue to decrease, but remain significantly elevated.  Patient was scheduled to be given warfarin 8/15, but due to combative nature warfarin was unable to be administered. INR continues to increase  despite warfarin not being administered (1.73 > 2.06 > 2.2), likely due to shock liver. Last dose of warfarin 8/11.  Goal of Therapy:  INR 2-3 Monitor platelets by anticoagulation protocol: Yes   Plan:  Discussed with Dr. Debara Pickett, will hold off on warfarin until INR subtherapeutic. Will continue to monitor.  Daily INR Monitor LFTs Monitor for s/sx of bleeding     Claiborne Billings, PharmD PGY2 Cardiology Pharmacy Resident Phone [phone] 01/15/2018 12:42 PM

## 2018-01-16 DIAGNOSIS — G92 Toxic encephalopathy: Secondary | ICD-10-CM

## 2018-01-16 LAB — RENAL FUNCTION PANEL
Albumin: 2.4 g/dL — ABNORMAL LOW (ref 3.5–5.0)
Anion gap: 15 (ref 5–15)
BUN: 51 mg/dL — ABNORMAL HIGH (ref 8–23)
CO2: 22 mmol/L (ref 22–32)
Calcium: 9.1 mg/dL (ref 8.9–10.3)
Chloride: 103 mmol/L (ref 98–111)
Creatinine, Ser: 6.4 mg/dL — ABNORMAL HIGH (ref 0.61–1.24)
GFR calc Af Amer: 10 mL/min — ABNORMAL LOW (ref >60)
GFR calc non Af Amer: 8 mL/min — ABNORMAL LOW (ref >60)
Glucose, Bld: 93 mg/dL (ref 70–99)
Phosphorus: 3.5 mg/dL (ref 2.5–4.6)
Potassium: 4.6 mmol/L (ref 3.5–5.1)
Sodium: 140 mmol/L (ref 135–145)

## 2018-01-16 LAB — HEPATITIS B SURFACE ANTIGEN: Hepatitis B Surface Ag: NEGATIVE

## 2018-01-16 LAB — GLUCOSE, CAPILLARY
Glucose-Capillary: 103 mg/dL — ABNORMAL HIGH (ref 70–99)
Glucose-Capillary: 142 mg/dL — ABNORMAL HIGH (ref 70–99)
Glucose-Capillary: 155 mg/dL — ABNORMAL HIGH (ref 70–99)
Glucose-Capillary: 166 mg/dL — ABNORMAL HIGH (ref 70–99)
Glucose-Capillary: 170 mg/dL — ABNORMAL HIGH (ref 70–99)
Glucose-Capillary: 178 mg/dL — ABNORMAL HIGH (ref 70–99)
Glucose-Capillary: 76 mg/dL (ref 70–99)
Glucose-Capillary: 89 mg/dL (ref 70–99)

## 2018-01-16 LAB — HEPATITIS B CORE ANTIBODY, TOTAL: Hep B Core Total Ab: NEGATIVE

## 2018-01-16 LAB — PROTIME-INR
INR: 1.96
Prothrombin Time: 22.1 seconds — ABNORMAL HIGH (ref 11.4–15.2)

## 2018-01-16 MED ORDER — WARFARIN SODIUM 2.5 MG PO TABS
2.5000 mg | ORAL_TABLET | Freq: Once | ORAL | Status: AC
  Administered 2018-01-16: 2.5 mg via ORAL
  Filled 2018-01-16: qty 1

## 2018-01-16 NOTE — Plan of Care (Signed)
  Problem: Education: Goal: Knowledge of General Education information will improve Description Including pain rating scale, medication(s)/side effects and non-pharmacologic comfort measures Outcome: Progressing   Problem: Health Behavior/Discharge Planning: Goal: Ability to manage health-related needs will improve Outcome: Progressing   Problem: Clinical Measurements: Goal: Ability to maintain clinical measurements within normal limits will improve Outcome: Progressing Goal: Will remain free from infection Outcome: Progressing Goal: Diagnostic test results will improve Outcome: Progressing Goal: Respiratory complications will improve Outcome: Progressing Goal: Cardiovascular complication will be avoided Outcome: Progressing   Problem: Activity: Goal: Risk for activity intolerance will decrease Outcome: Progressing   Problem: Coping: Goal: Level of anxiety will decrease Outcome: Progressing   Problem: Elimination: Goal: Will not experience complications related to bowel motility Outcome: Progressing Goal: Will not experience complications related to urinary retention Outcome: Progressing   Problem: Skin Integrity: Goal: Risk for impaired skin integrity will decrease Outcome: Progressing   Problem: Role Relationship: Goal: Method of communication will improve Outcome: Progressing

## 2018-01-16 NOTE — Progress Notes (Signed)
Patient bathed and transferred to another bed. Transporting to 43M room 04. Report called to Mickel Baas. Family is aware of transfer.

## 2018-01-16 NOTE — Progress Notes (Signed)
      [address]       [place] [address]             [phone]        CARDIOTHORACIC SURGERY PROGRESS NOTE   R6 Days Post-Op Procedure(s) (LRB): SUBXYPHOID PERICARDIAL WINDOW (N/A)  Subjective: Sleepy but arousable.  Follows commands.  Objective: Vital signs: BP Readings from Last 1 Encounters:  01/16/18 (!) 97/58   Pulse Readings from Last 1 Encounters:  01/16/18 83   Resp Readings from Last 1 Encounters:  01/16/18 14   Temp Readings from Last 1 Encounters:  01/16/18 97.8 F (36.6 C) (Oral)    Hemodynamics:    Physical Exam:   Breath sounds: clear  Heart sounds:  RRR  Incisions:  Clean and dry  Abdomen:  Soft, non-distended, non-tender  Extremities:  Warm, well-perfused   Intake/Output from previous day: 08/17 0701 - 08/18 0700 In: 237 [P.O.:237] Out: 1804 [Stool:4] Intake/Output this shift: Total I/O In: 477 [P.O.:240; Other:237] Out: -   Lab Results:  CBC: Recent Labs    01/14/18 0348 01/15/18 0702  WBC 14.0* 18.4*  HGB 8.6* 8.8*  HCT 27.3* 27.3*  PLT 164 191    BMET:  Recent Labs    01/15/18 0702 01/15/18 1725  NA 141 138  K 3.6 2.9*  CL 104 100  CO2 24 26  GLUCOSE 172* 197*  BUN 54* 35*  CREATININE 5.82* 4.36*  CALCIUM 9.6 8.9     PT/INR:   Recent Labs    01/16/18 0349  LABPROT 22.1*  INR 1.96    CBG (last 3)  Recent Labs    01/16/18 0044 01/16/18 0331 01/16/18 0744  GLUCAP 155* 166* 76    ABG    Component Value Date/Time   PHART 7.515 (H) 01/12/2018 0858   PCO2ART 34.5 01/12/2018 0858   PO2ART 223.0 (H) 01/12/2018 0858   HCO3 27.9 01/12/2018 0858   TCO2 29 01/12/2018 0858   ACIDBASEDEF 2.0 01/11/2018 0345   O2SAT 100.0 01/12/2018 0858    CXR: n/a  Assessment/Plan: S/P Procedure(s) (LRB): SUBXYPHOID PERICARDIAL WINDOW (N/A)  Clinically stable although WBC rising Surgical incision healing appropriately Probably should get old central lines out if possible Plan per medical  teams  Rexene Alberts, MD 01/16/2018 10:36 AM

## 2018-01-16 NOTE — Progress Notes (Signed)
Patient transferred from [facility] to [facility]M via smart bed. Shammy, RN with patient during transfer. IV was intact, no fluids running at this time. Tele: Box 5. Patient belongings brought with patient. Call bell within reach and will continue to monitor.   Farley Ly RN

## 2018-01-16 NOTE — Evaluation (Signed)
Clinical/Bedside Swallow Evaluation Patient Details  Name: [name] MRN: [id number] Date of Birth: [birth year]  Today's Date: 01/16/2018 Time: SLP Start Time (ACUTE ONLY): 0955 SLP Stop Time (ACUTE ONLY): 1015 SLP Time Calculation (min) (ACUTE ONLY): 20 min  Past Medical History:  Past Medical History:  Diagnosis Date  . Anemia   . Arthritis    HNP- lumbar, "all over my body"  . Blood transfusion    "years ago; blood was low" (08/05/2013)  . CKD (chronic kidney disease) stage 4, GFR 15-29 ml/min (HCC) 03/18/2012   [place]- T,TH,Sat.  . Diabetic nephropathy ([place])   . Diabetic retinopathy   . DVT (deep venous thrombosis) ([place])    "got one in my right leg now; I've had one before too, not sure which leg" (08/05/2013)  . ESRD (end stage renal disease) on dialysis [facility])    "just started today, (08/04/2013)"  . Family history of anesthesia complication    " my son wakes up slowly"  . GERD (gastroesophageal reflux disease)    uses alka seltzere on occas.   [name])    "one q now and then" (08/05/2013)  . Hyperlipidemia   . Hypertension   . 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 Nodular lymphoma of intra-abdominal lymph nodes ([place])   . Non Hodgkin's lymphoma ([place])    Tx 2009; "had chemo; it went away" (08/05/2013)  . Noncompliance 03/16/2012  . NSVT (nonsustained ventricular tachycardia) ([place]) 03/18/2012  . Peripheral vascular disease ([place])   . Pneumonia 2013   hosp.-   . Poor historian    pt. unsure of several answers to health history questions   . Skin cancer    melanoma - head  . Sleep apnea    "suppose to have a sleep study, but they never told me when. (08/05/2013)   Past Surgical History:  Past Surgical History:  Procedure Laterality Date  . ACHILLES TENDON SURGERY Right 03/13/2016   Procedure: ACHILLES LENGTHENING/KIDNER;  Surgeon: Edrick Kins, DPM;  Location: [facility];  Service: Podiatry;  Laterality: Right;  . AV FISTULA  PLACEMENT Left 02/03/2013   Procedure: ARTERIOVENOUS (AV) FISTULA CREATION- LEFT RADIAL CEPHALIC; ULTRASOUND GUIDED;  Surgeon: Mal Misty, MD;  [facility];  Service: Vascular;  Laterality: Left;  . AV FISTULA PLACEMENT Right 11/08/2015   Procedure: RIGHT BRACHIOCEPHALIC ARTERIOVENOUS (AV) FISTULA CREATION;  Surgeon: Serafina Mitchell, MD;  Location: [place];  Service: Vascular;  Laterality: Right;  . BASCILIC VEIN TRANSPOSITION Right 01/24/2016   Procedure: RIGHT SECOND STAGE BASILIC VEIN TRANSPOSITION;  Surgeon: Angelia Mould, MD;  Location: [facility];  Service: Vascular;  Laterality: Right;  . CARDIAC CATHETERIZATION    . COLONOSCOPY N/A 09/23/2015   Procedure: COLONOSCOPY;  Surgeon: Irene Shipper, MD;  Location: [facility];  Service: Endoscopy;  Laterality: N/A;  . Coloscopy    . EYE SURGERY Bilateral   . GAS INSERTION  05/10/2012   Procedure: INSERTION OF GAS;  Surgeon: Hayden Pedro, MD;  Location: [facility];  Service: Ophthalmology;  Laterality: Right;  . LEFT HEART CATH AND CORONARY ANGIOGRAPHY N/A 09/11/2016   Procedure: Left Heart Cath and Coronary Angiography;  Surgeon: Belva Crome, MD;  Location: [place] CV LAB;  Service: Cardiovascular;  Laterality: N/A;  . LESION EXCISION Right 05/08/2015   Procedure: EXCISION SCALP LESION;  Surgeon: Erroll Luna, MD;  Location: [facility];  Service: General;  Laterality: Right;  . MEMBRANE PEEL  05/10/2012   Procedure: MEMBRANE PEEL;  Surgeon: Hayden Pedro, MD;  Location: [facility];  Service: Ophthalmology;  Laterality: Right;  . PARS PLANA VITRECTOMY  08/27/2011   Procedure: PARS PLANA VITRECTOMY WITH 25 GAUGE;  Surgeon: Hayden Pedro, MD;  Location: [place];  Service: Ophthalmology;  Laterality: Left;  Repair of complex traction retinal detachment left eye  . PARS PLANA VITRECTOMY  05/10/2012   Procedure: PARS PLANA VITRECTOMY WITH 25 GAUGE;  Surgeon: Hayden Pedro, MD;  Location: [place] [place];  Service: Ophthalmology;  Laterality: Right;  Repair  Complex Traction Retinal Detachment  . PHOTOCOAGULATION WITH LASER  05/10/2012   Procedure: PHOTOCOAGULATION WITH LASER;  Surgeon: Hayden Pedro, MD;  Location: [place];  Service: Ophthalmology;  Laterality: Right;  . PORT-A-CATH REMOVAL    . PORTACATH PLACEMENT    . SUBXYPHOID PERICARDIAL WINDOW N/A 01/10/2018   Procedure: SUBXYPHOID PERICARDIAL WINDOW;  Surgeon: Rexene Alberts, MD;  Location: [facility];  Service: Thoracic;  Laterality: N/A;  . TRANSMETATARSAL AMPUTATION Right 03/13/2016   Procedure: TRANSMETATARSAL AMPUTATION;  Surgeon: Edrick Kins, DPM;  Location: [place];  Service: Podiatry;  Laterality: Right;  . VENA CAVA FILTER PLACEMENT  09/2012   due to preparation for surgery   HPI:  67 year old man with ESRD, PVD, GERD, DM, with cardiogenic shock secondary to tamponade.  Now resolved post pericardial window.  Ongoing issues of delirium. Intubated 01/10/18-01/12/18. CXR 01/13/18 revealed persistent edema, persistent focal opacity in the left base with possible small associated effusion.   Assessment / Plan / Recommendation Clinical Impression   Pt presents with increased risk for aspiration primarily due to altered mental status, though pt was intubated briefly, extubated on 01/12/18. Pt lethargic, but follows basic commands for oral motor examination with mod verbal, tactile cues. Noted generalized weakness, reduced labial seal and decreased ROM bilaterally. Tongue with orange-white coating, improving slightly with oral care though question thrush; RN informed. Pt required total assist for feeding, and given poor awareness, decreased labial seal he best retrieves liquids via straw, though coaching and encouragement is required for slightly thicker consistency (Ensure). Straw facilitates more timely transit, and there are no overt signs of aspiration, even when challenged with consecutive sips. Pt declined purees, though RN reports pt has tolerated medications in puree without difficulty. Greatest  risk for aspiration appears to be his mentation. Would continue full liquids, thin consistency, straws OK, with meds crushed in puree. Will follow for tolerance and advancement of solids as his mental status improves.      SLP Visit Diagnosis: Dysphagia, unspecified (R13.10)    Aspiration Risk  Mild aspiration risk;Moderate aspiration risk    Diet Recommendation Thin liquid   Liquid Administration via: Straw Medication Administration: Crushed with puree Supervision: Full supervision/cueing for compensatory strategies Compensations: Slow rate;Small sips/bites;Minimize environmental distractions;Other (Comment)(only when alert)    Other  Recommendations Oral Care Recommendations: Oral care QID Other Recommendations: Have oral suction available   Follow up Recommendations Other (comment)(tbd)      Frequency and Duration min 2x/week  2 weeks       Prognosis Prognosis for Safe Diet Advancement: Good Barriers to Reach Goals: Cognitive deficits      Swallow Study   General Date of Onset: 01/09/18 HPI: 67 year old man with ESRD, PVD, GERD, DM, with cardiogenic shock secondary to tamponade.  Now resolved post pericardial window.  Ongoing issues of delirium. Intubated 01/10/18-01/12/18. CXR 01/13/18 revealed persistent edema, persistent focal opacity in the left base  with possible small associated effusion. Type of Study: Bedside Swallow Evaluation Previous Swallow Assessment: none in chart Diet Prior to this Study: Thin liquids(full liquids) Temperature Spikes Noted: No Respiratory Status: Nasal cannula History of Recent Intubation: Yes Length of Intubations (days): 2 days Date extubated: 01/12/18 Behavior/Cognition: Lethargic/Drowsy;Confused;Distractible;Requires cueing Oral Cavity Assessment: Within Functional Limits Oral Care Completed by SLP: Yes Oral Cavity - Dentition: Missing dentition;Poor condition Vision: Impaired for self-feeding Self-Feeding Abilities: Total  assist Patient Positioning: Upright in bed Baseline Vocal Quality: Low vocal intensity Volitional Cough: Strong(productive of thick yellow secretions)    Oral/Motor/Sensory Function Overall Oral Motor/Sensory Function: Generalized oral weakness   Ice Chips Ice chips: Impaired Presentation: Spoon Oral Phase Impairments: Reduced labial seal;Poor awareness of bolus;Reduced lingual movement/coordination Oral Phase Functional Implications: Prolonged oral transit   Thin Liquid Thin Liquid: Impaired Presentation: Cup;Straw Oral Phase Impairments: Reduced labial seal;Poor awareness of bolus Oral Phase Functional Implications: Prolonged oral transit;Left anterior spillage    Nectar Thick Nectar Thick Liquid: Not tested   Honey Thick Honey Thick Liquid: Not tested   Puree Puree: Not tested   Solid     Solid: Not tested     Deneise Lever, MS, CCC-SLP Speech-Language Pathologist [phone]  Aliene Altes 01/16/2018,10:24 AM

## 2018-01-16 NOTE — Progress Notes (Signed)
RenalAssessment:  1ESRD-TTS at [facility] 2s/p pericardial effusion, open drainage 3 RUE AVF,now functioning with improved BP 5 Shock,resolved 6 coagulopathy/a fib 7 pos fluid balance  Plan: Hemodialysis prn and TTS, remove TC  Subjective: Interval History: lethargic after ativan  Objective: Vital signs in last 24 hours: Temp:  [97.7 F (36.5 C)-98 F (36.7 C)] 97.8 F (36.6 C) (08/18 0746) Pulse Rate:  [62-127] 83 (08/18 0900) Resp:  [11-25] 14 (08/18 0900) BP: (68-132)/(44-101) 97/58 (08/18 0900) SpO2:  [94 %-100 %] 96 % (08/18 0900) Weight:  [107.2 kg-109.3 kg] 109.3 kg (08/18 0500) Weight change: 0 kg  Intake/Output from previous day: 08/17 0701 - 08/18 0700 In: 237 [P.O.:237] Out: 1804 [Stool:4] Intake/Output this shift: Total I/O In: 477 [P.O.:240; Other:237] Out: -   General appearance: slowed mentation Resp: clear to auscultation bilaterally Extremities: edema reduced  AVF RUE ok  Lab Results: Recent Labs    01/14/18 0348 01/15/18 0702  WBC 14.0* 18.4*  HGB 8.6* 8.8*  HCT 27.3* 27.3*  PLT 164 191   BMET:  Recent Labs    01/15/18 0702 01/15/18 1725  NA 141 138  K 3.6 2.9*  CL 104 100  CO2 24 26  GLUCOSE 172* 197*  BUN 54* 35*  CREATININE 5.82* 4.36*  CALCIUM 9.6 8.9   No results for input(s): PTH in the last 72 hours. Iron Studies: No results for input(s): IRON, TIBC, TRANSFERRIN, FERRITIN in the last 72 hours. Studies/Results: No results found.  Scheduled: . chlorhexidine  15 mL Mouth Rinse BID  . Chlorhexidine Gluconate Cloth  6 each Topical Daily  . Chlorhexidine Gluconate Cloth  6 each Topical Q0600  . feeding supplement (ENSURE ENLIVE)  237 mL Oral TID BM  . insulin aspart  2-6 Units Subcutaneous Q4H  . mouth rinse  15 mL Mouth Rinse q12n4p  . midodrine  5 mg Oral TID WC  . multivitamin  1 tablet Oral QHS  . sodium chloride flush  10-40 mL Intracatheter Q12H     LOS: 7 days   Estanislado Emms 01/16/2018,11:16 AM

## 2018-01-16 NOTE — Progress Notes (Signed)
[name] for Warfarin Management  Indication: History of DVT and pAF  No Known Allergies  Patient Measurements: Height: _0  (190.5 cm) Weight: 240 lb 15.4 oz (109.3 kg) IBW/kg (Calculated) : 84.5  Vital Signs: Temp: 97.8 F (36.6 C) (08/18 0746) Temp Source: Oral (08/18 0746) BP: 97/58 (08/18 0900) Pulse Rate: 83 (08/18 0900)  Labs: Recent Labs    01/14/18 0348 01/14/18 1656 01/15/18 0332 01/15/18 0702 01/15/18 1725 01/16/18 0349  HGB 8.6*  --   --  8.8*  --   --   HCT 27.3*  --   --  27.3*  --   --   PLT 164  --   --  191  --   --   LABPROT 22.9*  --  24.3*  --   --  22.1*  INR 2.05  --  2.20  --   --  1.96  CREATININE 3.75* 4.66*  --  5.82* 4.36*  --    Hepatic Function Latest Ref Rng & Units 01/15/2018 01/15/2018 01/14/2018  Total Protein 6.5 - 8.1 g/dL - - -  Albumin 3.5 - 5.0 g/dL 2.5(L) 2.5(L) 2.6(L)  AST 15 - 41 U/L - - -  ALT 0 - 44 U/L - - -  Alk Phosphatase 38 - 126 U/L - - -  Total Bilirubin 0.3 - 1.2 mg/dL - - -  Bilirubin, Direct 0.0 - 0.2 mg/dL - - -    Estimated Creatinine Clearance: 22.9 mL/min (A) (by C-G formula based on SCr of 4.36 mg/dL (H)).   Medical History: Past Medical History:  Diagnosis Date  . Anemia   . Arthritis    HNP- lumbar, "all over my body"  . Blood transfusion    "years ago; blood was low" (08/05/2013)  . CKD (chronic kidney disease) stage 4, GFR 15-29 ml/min (HCC) 03/18/2012   [place]- T,TH,Sat.  . Diabetic nephropathy ([place])   . Diabetic retinopathy   . DVT (deep venous thrombosis) ([place])    "got one in my right leg now; I've had one before too, not sure which leg" (08/05/2013)  . ESRD (end stage renal disease) on dialysis [facility])    "just started today, (08/04/2013)"  . Family history of anesthesia complication    " my son wakes up slowly"  . GERD (gastroesophageal reflux disease)    uses alka seltzere on occas.   [name])    "one q now and then" (08/05/2013)   . Hyperlipidemia   . Hypertension   . 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 Nodular lymphoma of intra-abdominal lymph nodes ([place])   . Non Hodgkin's lymphoma ([place])    Tx 2009; "had chemo; it went away" (08/05/2013)  . Noncompliance 03/16/2012  . NSVT (nonsustained ventricular tachycardia) ([place]) 03/18/2012  . Peripheral vascular disease ([place])   . Pneumonia 2013   hosp.-   . Poor historian    pt. unsure of several answers to health history questions   . Skin cancer    melanoma - head  . Sleep apnea    "suppose to have a sleep study, but they never told me when. (08/05/2013)    Medications:  Scheduled:  . chlorhexidine  15 mL Mouth Rinse BID  . Chlorhexidine Gluconate Cloth  6 each Topical Daily  . Chlorhexidine Gluconate Cloth  6 each Topical Q0600  . feeding supplement (ENSURE ENLIVE)  237 mL Oral TID BM  . insulin aspart  2-6  Units Subcutaneous Q4H  . mouth rinse  15 mL Mouth Rinse q12n4p  . midodrine  5 mg Oral TID WC  . multivitamin  1 tablet Oral QHS  . sodium chloride flush  10-40 mL Intracatheter Q12H   Infusions:  . sodium chloride Stopped (01/14/18 9927)    Assessment: Pt is a 14 yoM with a history of DVT (15 years ago per pt) receiving warfarin PTA. During admission, pt developed atrial fibrillation. Home warfarin dose is 7.5 mg daily except 10 mg Mon/Thurs. Warfarin has been held since admission secondary to elevated INR, peaked at 10 has downtrended. Of note, patient received 10 mg [place] phytonadione and a total of 20 mg IV phytonadione on 8/13.   Plan to restart warfarin lower than home dose given shock liver.  LFTs continue to decrease, but remain significantly elevated.  Patient was scheduled to be given warfarin 8/15, but due to combative nature warfarin was unable to be administered. INR increased despite warfarin not being administered (1.73 > 2.06 > 2.2 > 1.96), likely due to shock liver. Last dose of warfarin 8/11. No signs/symptoms of  bleeding noted by nursing.   Goal of Therapy:  INR 2-3 Monitor platelets by anticoagulation protocol: Yes   Plan:  Discussed with Dr. Debara Pickett 8/16, will hold off on warfarin until INR subtherapeutic. As INR < 2, will cautiously give warfarin 2.5 mg x1 Daily INR CMET 8/19 to monitor LFTs  Monitor for s/sx of bleeding     Claiborne Billings, PharmD PGY2 Cardiology Pharmacy Resident Phone [phone] 01/16/2018 11:36 AM

## 2018-01-16 NOTE — Progress Notes (Signed)
PULMONARY / CRITICAL CARE MEDICINE   Name: [name] MRN: [id number] DOB: 12-17-1950    ADMISSION DATE:  01/09/2018 CONSULTATION DATE: 01/09/18  REFERRING MD: Transfer from [place]: Shock   HISTORY OF PRESENT ILLNESS:   66yoM with hx ESRD on HD (last HD on 01/08/18), OSA, PVD, NHL (2009), DM, HTN, GERD, Anemia, CHF (EF 20%), LE DVT (remote 54yrs ago per pt) on Coumadin.  Transferred from [facility] he had presented with generalized malaise and chest pain.  Found to be hypotensive.  Echocardiogram subsequently showed large pericardial effusion with evidence of cardiac tamponade.  Hemodynamics have improved since drainage via a pericardial window.  The patient was successfully extubated 8/14.    SUBJECTIVE:  Given ativan overnight for agitation  but nursing feels can go to floor - somewhat somnolent this am but opens eyes, identified son Ronalee Belts at bedside "I'm in a hospital"    VITAL SIGNS: BP 110/60   Pulse 91   Temp 97.9 F (36.6 C) (Oral)   Resp 19   Ht 6\' 3"  (1.905 m)   Wt 109.3 kg   SpO2 95%   BMI 30.12 kg/m     INTAKE / OUTPUT: I/O last 3 completed shifts: In: 49 [P.O.:474] Out: 1804 [Other:1800; Stool:4]  PHYSICAL EXAMINATION: Chronically ill appearing nad on 2lpm    No jvd Oropharynx clear Neck supple Lungs with a few scattered exp > insp rhonchi bilaterally RRR no s3 or or sign murmur Abd soft  Extr wam with no edema or clubbing noted Neuro  Moves all 4 to request   Chronically ill appearing bm nad/  2lpm     No jvd Oropharynx clear Neck supple Lungs with a minimal exp > insp rhonchi bilaterally RRR no s3 or or sign murmur Abd soft/ benign  Extr wam with no edema or clubbing noted       LABS:  BMET Recent Labs  Lab 01/14/18 1656 01/15/18 0702 01/15/18 1725  NA 138 141 138  K 4.0 3.6 2.9*  CL 102 104 100  CO2 23 24 26   BUN 38* 54* 35*  CREATININE 4.66* 5.82* 4.36*  GLUCOSE 182* 172* 197*    Electrolytes Recent Labs  Lab 01/11/18 0008 01/11/18 0412  01/14/18 0348 01/14/18 1656 01/15/18 0702 01/15/18 1725  CALCIUM  --  8.0*   < > 9.0 9.1 9.6 8.9  MG 2.2 2.2  --  2.7*  --   --   --   PHOS 8.1*  --    < >  --  4.1 3.6 2.6   < > = values in this interval not displayed.   CBC Recent Labs  Lab 01/13/18 0351 01/14/18 0348 01/15/18 0702  WBC 15.3* 14.0* 18.4*  HGB 9.4* 8.6* 8.8*  HCT 30.1* 27.3* 27.3*  PLT 193 164 191   Coag's Recent Labs  Lab 01/14/18 0348 01/15/18 0332 01/16/18 0349  INR 2.05 2.20 1.96   Sepsis Markers Recent Labs  Lab 01/09/18 1532  01/10/18 0354 01/10/18 0713 01/11/18 0005 01/11/18 0008 01/11/18 0305  LATICACIDVEN  --    < > 10.9* 12.7* 10.8*  --  8.5*  PROCALCITON 1.44  --  3.04  --   --  21.21  --    < > = values in this interval not displayed.   ABG Recent Labs  Lab 01/11/18 0345 01/11/18 0937 01/12/18 0858  PHART 7.406 7.429 7.515*  PCO2ART 34.5 36.7 34.5  PO2ART 204.0* 42.0* 223.0*  Liver Enzymes Recent Labs  Lab 01/12/18 0346  01/13/18 0351  01/14/18 0348 01/14/18 1656 01/15/18 0702 01/15/18 1725  AST 4,778*  --  1,788*  --  565*  --   --   --   ALT 5,783*  --  4,017*  --  2,540*  --   --   --   ALKPHOS 196*  --  208*  --  193*  --   --   --   BILITOT 3.5*  --  3.9*  --  3.9*  --   --   --   ALBUMIN 3.0*   < > 3.0*   < > 2.7* 2.6* 2.5* 2.5*   < > = values in this interval not displayed.   Cardiac Enzymes Recent Labs  Lab 01/09/18 2217 01/10/18 0354 01/11/18 0412  TROPONINI 0.11* 0.33* 2.35*    Glucose Recent Labs  Lab 01/15/18 1925 01/15/18 2257 01/16/18 0044 01/16/18 0331 01/16/18 0744 01/16/18 1145  GLUCAP 144* 166* 155* 166* 76 178*   Imaging No results found.   STUDIES:  CT Abdomen/Pelvis w IV contrast 8/12 >> cholelithiasis with gallbladder wall thickening, pericholecystic edema suggestive of cholecystitis, infiltration in the peripancreatic fat consistent with acute  pancreatitis, no loculated collections, diffuse fatty infiltration of the liver, small left pleural effusion, consolidation in the left lung base, large pericardial effusion, small periumbilical hernia containing fat  CULTURES: BCx2 8/11>>  Neg  BC x 1 8/12 > neg   Pericardial Fluid 8/12 >> neg   ANTIBIOTICS: Vanc 8/11 >> 8/14 Zosyn 8/11 > 8/15   SIGNIFICANT EVENTS: 8/11 Presented to [facility] ER with abd pain, shock, N/V >> diagnosed with pneumonia and transferred to [facility] ICU 8/12  Patient decompensated overnight, intubated > to OR for pericardial window.   8/13  Pauses in early am, CVVHD started.  Levophed 34 mcg's, vasopressin  8/15 all  Vasoactives off 8/18 ready for transfer to floor    LINES/TUBES: RUE AV fistula LIJ TLC 8/12 >> R IJ HD cath 8/13> plan to d/c 8/18 ET d/c   8/14   DISCUSSION: 67 year old man with ESRD with cardiogenic shock secondary to tamponade.  Now resolved post pericardial window.  Ongoing issues of delirium gradually improving   ASSESSMENT / PLAN:  Acute Hypoxic Respiratory Failure-improved.  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  Therapeutic doses coumadin per pharmacy  SCDs d/c'd 01/16/18 as INR 1.96    Shock - secondary to pericardial effusion now resolved. P: Stopped all antibiotics 8/15    DM P: SSI  CBG Q4  Acute Metabolic Encephalopathy - improving slowly P:  no  change rx    FAMILY  - Son Ronalee Belts at beside updated am 01/16/18    Christinia Gully, MD Pulmonary and [place] [phone] After 5:30 PM or weekends,  use [facility] [phone]

## 2018-01-17 DIAGNOSIS — E440 Moderate protein-calorie malnutrition: Secondary | ICD-10-CM

## 2018-01-17 LAB — RENAL FUNCTION PANEL
Albumin: 2.3 g/dL — ABNORMAL LOW (ref 3.5–5.0)
Anion gap: 17 — ABNORMAL HIGH (ref 5–15)
BUN: 68 mg/dL — ABNORMAL HIGH (ref 8–23)
CO2: 21 mmol/L — ABNORMAL LOW (ref 22–32)
Calcium: 9.2 mg/dL (ref 8.9–10.3)
Chloride: 100 mmol/L (ref 98–111)
Creatinine, Ser: 7.67 mg/dL — ABNORMAL HIGH (ref 0.61–1.24)
GFR calc Af Amer: 8 mL/min — ABNORMAL LOW (ref >60)
GFR calc non Af Amer: 7 mL/min — ABNORMAL LOW (ref >60)
Glucose, Bld: 158 mg/dL — ABNORMAL HIGH (ref 70–99)
Phosphorus: 4.8 mg/dL — ABNORMAL HIGH (ref 2.5–4.6)
Potassium: 5.2 mmol/L — ABNORMAL HIGH (ref 3.5–5.1)
Sodium: 138 mmol/L (ref 135–145)

## 2018-01-17 LAB — GLUCOSE, CAPILLARY
Glucose-Capillary: 128 mg/dL — ABNORMAL HIGH (ref 70–99)
Glucose-Capillary: 128 mg/dL — ABNORMAL HIGH (ref 70–99)
Glucose-Capillary: 119 mg/dL — ABNORMAL HIGH (ref 70–99)
Glucose-Capillary: 148 mg/dL — ABNORMAL HIGH (ref 70–99)
Glucose-Capillary: 157 mg/dL — ABNORMAL HIGH (ref 70–99)

## 2018-01-17 LAB — COMPREHENSIVE METABOLIC PANEL
ALT: 840 U/L — ABNORMAL HIGH (ref 0–44)
AST: 89 U/L — ABNORMAL HIGH (ref 15–41)
Albumin: 2.3 g/dL — ABNORMAL LOW (ref 3.5–5.0)
Alkaline Phosphatase: 182 U/L — ABNORMAL HIGH (ref 38–126)
Anion gap: 15 (ref 5–15)
BUN: 61 mg/dL — ABNORMAL HIGH (ref 8–23)
CO2: 23 mmol/L (ref 22–32)
Calcium: 9.2 mg/dL (ref 8.9–10.3)
Chloride: 102 mmol/L (ref 98–111)
Creatinine, Ser: 7.21 mg/dL — ABNORMAL HIGH (ref 0.61–1.24)
GFR calc Af Amer: 8 mL/min — ABNORMAL LOW (ref >60)
GFR calc non Af Amer: 7 mL/min — ABNORMAL LOW (ref >60)
Glucose, Bld: 122 mg/dL — ABNORMAL HIGH (ref 70–99)
Potassium: 4.5 mmol/L (ref 3.5–5.1)
Sodium: 140 mmol/L (ref 135–145)
Total Bilirubin: 3.2 mg/dL — ABNORMAL HIGH (ref 0.3–1.2)
Total Protein: 5.8 g/dL — ABNORMAL LOW (ref 6.5–8.1)

## 2018-01-17 LAB — PROTIME-INR
INR: 1.72
Prothrombin Time: 20 seconds — ABNORMAL HIGH (ref 11.4–15.2)

## 2018-01-17 MED ORDER — NEPRO/CARBSTEADY PO LIQD
237.0000 mL | Freq: Two times a day (BID) | ORAL | Status: DC
  Administered 2018-01-19 – 2018-01-21 (×3): 237 mL via ORAL
  Filled 2018-01-17 (×10): qty 237

## 2018-01-17 MED ORDER — PRO-STAT SUGAR FREE PO LIQD
30.0000 mL | Freq: Two times a day (BID) | ORAL | Status: DC
  Administered 2018-01-17 (×2): 30 mL via ORAL
  Filled 2018-01-17 (×2): qty 30

## 2018-01-17 MED ORDER — WARFARIN SODIUM 7.5 MG PO TABS
7.5000 mg | ORAL_TABLET | Freq: Once | ORAL | Status: AC
Start: 2018-01-17 — End: 2018-01-17
  Administered 2018-01-17: 7.5 mg via ORAL
  Filled 2018-01-17: qty 1

## 2018-01-17 MED ORDER — SODIUM CHLORIDE 0.9 % IV SOLN
62.5000 mg | INTRAVENOUS | Status: DC
  Administered 2018-01-18: 62.5 mg via INTRAVENOUS
  Filled 2018-01-17 (×2): qty 5

## 2018-01-17 MED ORDER — JEVITY 1.2 CAL PO LIQD
1000.0000 mL | ORAL | Status: DC
  Filled 2018-01-17 (×2): qty 1000

## 2018-01-17 MED ORDER — DARBEPOETIN ALFA 60 MCG/0.3ML IJ SOSY
60.0000 ug | PREFILLED_SYRINGE | INTRAMUSCULAR | Status: DC
  Administered 2018-01-18: 60 ug via INTRAVENOUS
  Filled 2018-01-17: qty 0.3

## 2018-01-17 MED ORDER — CHLORHEXIDINE GLUCONATE CLOTH 2 % EX PADS: 6.0000 | MEDICATED_PAD | Freq: Every day | CUTANEOUS | Status: DC

## 2018-01-17 MED ORDER — WARFARIN - PHARMACIST DOSING INPATIENT: Freq: Every day | Status: DC

## 2018-01-17 NOTE — Progress Notes (Signed)
Nutrition Follow-up  DOCUMENTATION CODES:   Non-severe (moderate) malnutrition in context of chronic illness(May have element of acute malnutrition as well)  INTERVENTION:   Magic cup TID with meals, each supplement provides 290 kcal and 9 grams of protein  Continue to assist with feedings  Continue Renal MVI  Continue Ensure Enlive po BID, each supplement provides 350 kcal and 20 grams of protein  Continue Pro-Stat  Recommend considering insertion of Cortrak tube with initiation of Tube Feeding: Tube Feeding:  Vital 1.5 @ 65 ml/hr Pro-Stat 30 mL daily Provides 2440 kcals, 121 g of protein and 1186 mL of free water Meets 100% estimated calorie and protein needs  NUTRITION DIAGNOSIS:   Moderate Malnutrition related to chronic illness(ESRD on HD, CM EF 20%; exacerbated by acute illness) as evidenced by moderate muscle depletion, moderate fat depletion.  GOAL:   Patient will meet greater than or equal to 90% of their needs  Not Met  MONITOR:   PO intake, Supplement acceptance, Diet advancement, Labs, Weight trends  REASON FOR ASSESSMENT:   Ventilator    ASSESSMENT:   67 yo male admitted with septic and cardiogenic shock, acute respiratory failure requiring vent support, pericaridal effusion taking emergently to OR. Pt with hx of ESRD on HD, DM, HTN, NICM EF 20%, GERD, HLD, PVD  8/12 Intubated, OR for pericardial window 8/13 CVVHD started 8/14 Extubated 8/15 CVVHD stopped  Pt lethargic but arouseable. Pt remains on FL diet. SLP following. Noted pt declined Purees yesterday despite tolerating puree consistency with med pass. Pt also required total assist with feeding.   Pt took half of Magic Cup this AM per RN. Pt refused Ensure, refused Nepro. Recorded po intake on liquid diet is 0-15% Inadequate nutrition x 8 days  Next HD tomorrow  Weight down overall since admission; unsure of outpatient EDW. Weight on admission 111 kg; current wt 109 kg. Weight post-op 114  kg. Per I/O flow sheet, pt net + 4L since admission, unsure of accuracy  Labs: phosphorus wdl, potassium wdl Meds: Rena-Vit, ss novolog  NUTRITION - FOCUSED PHYSICAL EXAM:    Most Recent Value  Orbital Region  Moderate depletion  Upper Arm Region  Mild depletion  Thoracic and Lumbar Region  No depletion  Buccal Region  Moderate depletion  Temple Region  Moderate depletion  Clavicle Bone Region  Moderate depletion  Clavicle and Acromion Bone Region  Mild depletion  Scapular Bone Region  Moderate depletion  Dorsal Hand  Mild depletion  Patellar Region  No depletion  Anterior Thigh Region  No depletion  Posterior Calf Region  No depletion  Edema (RD Assessment)  Mild       Diet Order:   Diet Order            Diet full liquid Room service appropriate? Yes; Fluid consistency: Thin  Diet effective now              EDUCATION NEEDS:   Not appropriate for education at this time  Skin:  Skin Assessment: Skin Integrity Issues: Skin Integrity Issues:: Incisions Incisions: chest  Last BM:  8/18  Height:   Ht Readings from Last 1 Encounters:  01/09/18 '6\' 3"'  (1.905 m)    Weight:   Wt Readings from Last 1 Encounters:  01/16/18 109.3 kg    Ideal Body Weight:  89 kg  BMI:  Body mass index is 30.12 kg/m.  Estimated Nutritional Needs:   Kcal:  2400-2600 kcals   Protein:  120-130 g  Fluid:  1000 mL plus UOP   [employer] MS, [place], LDN, CNSC [phone] Pager  [phone] Weekend/On-Call Pager

## 2018-01-17 NOTE — Progress Notes (Signed)
PROGRESS NOTE    [name]  YCX:[id number] DOB: 03-17-1951 DOA: 01/09/2018 PCP: Burnard Bunting, MD      Brief Narrative:  [name] is a 67 y.o. M with ESRD on HD, HTN, DM, hx of NHL, PVD, hx of IVC, hx of pAF on warfarin, and CHF EF 20% who presented with malaise, hypotension, found to have pericardial effusion with tamponade.  Initially presented with malaise and hypotension.  Work-up showed a left lower lobe infiltrate on chest x-ray, INR >10, lactate 10, he was started on vancomycin and Zosyn and admitted to the ICU.  Follow-up CT of the abdomen and pelvis showed a pericardial effusion, urgent bedside echocardiogram showed tamponade physiology, he decompensated is been intubated. -Intubated 8/12 - Cardiocentesis was not possible, so he underwent urgent pericardial window 8/12 -Extubated 8/14 - 8/15 pressors were stopped -8/15 antibiotics were stopped       Assessment & Plan:  Pericardial effusion S/p pericardial window 8/12 -Consult CT surgery -Consult cardiology, appreciate cares  ESRD on HD -Consult nephrology, appreciate cares  Diabetes Every 4 SSI  Hypertension He has been hypotensive -Continue midodrine  Moderate protein calorie malnutrition Per nursing he is unable to feed himself. -Placed core track Dietitian consult for tube feeds  Anemia  Acute metabolic encephalopathy      DVT prophylaxis: SCDs Code Status: Full code Family Communication: None present MDM and disposition Plan: The below labs and imaging reports were reviewed and summarized above.    The patient was admitted with pericardial tamponade, underwent pericardial window on 8/12, weaned from pressors 8/15 and extubated.  Currently advancing diet and treating resolving encephalopathy.  At baseline, the patient able to care for sself independently, currently he is unable to answer questions intelligibly, walk.  Will require extensive rehabilitation, safe placement is  pending.   Consultants:   GI  General surgery  Nephrology  Cardiothoracic surgery  Cardiology  Critical care  Procedures:     Antimicrobials:       Subjective: Discussed with nursing.  No new fever, respiratory distress.  He is somewhat confused but answering quetisons, very sluggish, needs assitsnce with bed transfers, needs assistance with feeding self.  Objective: Vitals:   01/16/18 2013 01/17/18 0423 01/17/18 0809 01/17/18 1808  BP: (!) 103/52 108/66 101/64 111/60  Pulse: 82 65 (!) 53 (!) 55  Resp: 16 16    Temp: 98.4 F (36.9 C) 98.4 F (36.9 C) 97.7 F (36.5 C) 98.6 F (37 C)  TempSrc:   Oral Oral  SpO2: 95% 99% 98% 93%  Weight: 109.3 kg     Height:        Intake/Output Summary (Last 24 hours) at 01/17/2018 1842 Last data filed at 01/17/2018 0830 Gross per 24 hour  Intake 60 ml  Output 0 ml  Net 60 ml   Filed Weights   01/15/18 1610 01/16/18 0500 01/16/18 2013  Weight: 107.2 kg 109.3 kg 109.3 kg    Examination: General appearance:  adult male, sleeping, slow to rouse, n o obvious distress.   HEENT: Anicteric, conjunctiva watery, lids and lashes normal. No nasal deformity, discharge, epistaxis.  Lipsddry, OP dry, no oral lesions, hard of hearing.   Skin: Warm and dry.   No suspicious rashes or lesions. Cardiac: RRR, nl S1-S2, no murmurs appreciated.  No edema Respiratory: Normal respiratory rate and rhythm.  CTAB without rales or wheezes. Abdomen: Abdomen soft.   No TTP or guarding.  Neuro: Awake but sluggish.  Does not follow all commands.  Makes eye contact but falls back asleep, very weak globally, symmetric  Psych: Attention dimiinished, judgment appears impaired.    Data Reviewed: I have personally reviewed following labs and imaging studies:  CBC: Recent Labs  Lab 01/11/18 0815 01/12/18 0346 01/13/18 0351 01/14/18 0348 01/15/18 0702  WBC 16.8* 12.8* 15.3* 14.0* 18.4*  HGB 9.7* 9.0* 9.4* 8.6* 8.8*  HCT 29.8* 27.4* 30.1* 27.3*  27.3*  MCV 90.9 88.4 93.8 93.8 91.6  PLT 234 219 193 164 212   Basic Metabolic Panel: Recent Labs  Lab 01/11/18 0008 01/11/18 0412  01/14/18 0348 01/14/18 1656 01/15/18 0702 01/15/18 1725 01/16/18 1708 01/17/18 0443 01/17/18 1525  NA  --  136   < > 140 138 141 138 140 140 138  K  --  6.0*   < > 4.0 4.0 3.6 2.9* 4.6 4.5 5.2*  CL  --  91*   < > 102 102 104 100 103 102 100  CO2  --  18*   < > 23 23 24 26 22 23  21*  GLUCOSE  --  189*   < > 142* 182* 172* 197* 93 122* 158*  BUN  --  40*   < > 32* 38* 54* 35* 51* 61* 68*  CREATININE  --  8.00*   < > 3.75* 4.66* 5.82* 4.36* 6.40* 7.21* 7.67*  CALCIUM  --  8.0*   < > 9.0 9.1 9.6 8.9 9.1 9.2 9.2  MG 2.2 2.2  --  2.7*  --   --   --   --   --   --   PHOS 8.1*  --    < >  --  4.1 3.6 2.6 3.5  --  4.8*   < > = values in this interval not displayed.   GFR: Estimated Creatinine Clearance: 13 mL/min (A) (by C-G formula based on SCr of 7.67 mg/dL (H)). Liver Function Tests: Recent Labs  Lab 01/11/18 0815  01/12/18 0346  01/13/18 0351  01/14/18 0348  01/15/18 0702 01/15/18 1725 01/16/18 1708 01/17/18 0443 01/17/18 1525  AST >10,000*  --  4,778*  --  1,788*  --  565*  --   --   --   --  89*  --   ALT 7,786*  --  5,783*  --  4,017*  --  2,540*  --   --   --   --  840*  --   ALKPHOS 178*  --  196*  --  208*  --  193*  --   --   --   --  182*  --   BILITOT 3.2*  --  3.5*  --  3.9*  --  3.9*  --   --   --   --  3.2*  --   PROT 7.3  --  6.8  --  7.0  --  6.4*  --   --   --   --  5.8*  --   ALBUMIN 3.1*  3.2*   < > 3.0*   < > 3.0*   < > 2.7*   < > 2.5* 2.5* 2.4* 2.3* 2.3*   < > = values in this interval not displayed.   Recent Labs  Lab 01/11/18 0412 01/12/18 0346 01/13/18 0351  LIPASE 117* 41 35   No results for input(s): AMMONIA in the last 168 hours. Coagulation Profile: Recent Labs  Lab 01/13/18 0351 01/14/18 0348 01/15/18 0332 01/16/18 0349 01/17/18 0443  INR 1.73 2.05 2.20 1.96  1.72   Cardiac Enzymes: Recent Labs   Lab 01/11/18 0412  TROPONINI 2.35*   BNP (last 3 results) No results for input(s): PROBNP in the last 8760 hours. HbA1C: No results for input(s): HGBA1C in the last 72 hours. CBG: Recent Labs  Lab 01/16/18 2345 01/17/18 0424 01/17/18 0820 01/17/18 1150 01/17/18 1624  GLUCAP 142* 128* 128* 119* 148*   Lipid Profile: No results for input(s): CHOL, HDL, LDLCALC, TRIG, CHOLHDL, LDLDIRECT in the last 72 hours. Thyroid Function Tests: No results for input(s): TSH, T4TOTAL, FREET4, T3FREE, THYROIDAB in the last 72 hours. Anemia Panel: No results for input(s): VITAMINB12, FOLATE, FERRITIN, TIBC, IRON, RETICCTPCT in the last 72 hours. Urine analysis:    Component Value Date/Time   COLORURINE YELLOW 08/04/2013 [place] 08/04/2013 0249   LABSPEC 1.013 08/04/2013 0249   PHURINE 7.5 08/04/2013 0249   GLUCOSEU 500 (A) 08/04/2013 0249   HGBUR SMALL (A) 08/04/2013 0249   BILIRUBINUR NEGATIVE 08/04/2013 0249   KETONESUR NEGATIVE 08/04/2013 0249   PROTEINUR 100 (A) 08/04/2013 0249   UROBILINOGEN 0.2 08/04/2013 0249   NITRITE NEGATIVE 08/04/2013 0249   LEUKOCYTESUR NEGATIVE 08/04/2013 0249   Sepsis Labs: @LABRCNTIP (procalcitonin:4,lacticacidven:4)  ) Recent Results (from the past 240 hour(s))  Culture, blood (routine x 2)     Status: None   Collection Time: 01/09/18  1:00 AM  Result Value Ref Range Status   Specimen Description BLOOD CENTRAL LINE  Final   Special Requests   Final    BOTTLES DRAWN AEROBIC ONLY Blood Culture adequate volume   Culture   Final    NO GROWTH 5 DAYS Performed at [facility], [address] [place], [place] [address]    Report Status 01/15/2018 FINAL  Final  Blood Culture (routine x 2)     Status: None   Collection Time: 01/09/18  4:16 PM  Result Value Ref Range Status   Specimen Description   Final    LEFT ANTECUBITAL BOTTLES DRAWN AEROBIC AND ANAEROBIC   Special Requests Blood Culture adequate volume  Final   Culture    Final    NO GROWTH 5 DAYS Performed at [facility], [address] [place], [place] [address]    Report Status 01/14/2018 FINAL  Final  MRSA PCR Screening     Status: None   Collection Time: 01/09/18  9:21 PM  Result Value Ref Range Status   MRSA by PCR NEGATIVE NEGATIVE Final    Comment:        The GeneXpert MRSA Assay (FDA approved for NASAL specimens only), is one component of a comprehensive MRSA colonization surveillance program. It is not intended to diagnose MRSA infection nor to guide or monitor treatment for MRSA infections. Performed at [facility], [place] [address] [place], [place] [address]   Culture, blood (routine x 2)     Status: None   Collection Time: 01/10/18  1:00 AM  Result Value Ref Range Status   Specimen Description BLOOD CENTRAL LINE  Final   Special Requests   Final    BOTTLES DRAWN AEROBIC ONLY Blood Culture adequate volume   Culture   Final    NO GROWTH 5 DAYS Performed at [facility], [address] [place], [place] [address]    Report Status 01/15/2018 FINAL  Final  Body fluid culture     Status: None   Collection Time: 01/10/18  9:28 AM  Result Value Ref Range Status   Specimen Description FLUID  PERICARDIAL  Final   Special Requests C  Final   Gram Stain   Final    MODERATE WBC PRESENT, PREDOMINANTLY PMN NO ORGANISMS SEEN    Culture   Final    NO GROWTH 3 DAYS Performed at [place] [facility] [address] [place], [place] [address]    Report Status 01/13/2018 FINAL  Final  Body fluid culture     Status: None   Collection Time: 01/10/18 10:15 AM  Result Value Ref Range Status   Specimen Description FLUID PERICARDIAL  Final   Special Requests B  Final   Gram Stain   Final    MODERATE WBC PRESENT, PREDOMINANTLY PMN NO ORGANISMS SEEN    Culture   Final    NO GROWTH 3 DAYS Performed at [facility], [place] [address] [place], [place] [address]    Report Status 01/13/2018 FINAL  Final         Radiology  Studies: No results found.      Scheduled Meds: . chlorhexidine  15 mL Mouth Rinse BID  . Chlorhexidine Gluconate Cloth  6 each Topical Q0600  . [START ON 01/18/2018] darbepoetin (ARANESP) injection - DIALYSIS  60 mcg Intravenous Q Tue-HD  . feeding supplement (ENSURE ENLIVE)  237 mL Oral TID BM  . feeding supplement (NEPRO CARB STEADY)  237 mL Oral BID BM  . feeding supplement (PRO-STAT SUGAR FREE 64)  30 mL Oral BID  . insulin aspart  2-6 Units Subcutaneous Q4H  . mouth rinse  15 mL Mouth Rinse q12n4p  . midodrine  5 mg Oral TID WC  . multivitamin  1 tablet Oral QHS  . sodium chloride flush  10-40 mL Intracatheter Q12H  . Warfarin - Pharmacist Dosing Inpatient   Does not apply q1800   Continuous Infusions: . sodium chloride Stopped (01/14/18 0624)  . [START ON 01/18/2018] ferric gluconate (FERRLECIT/NULECIT) IV       LOS: 8 days    Time spent: 25 minutes    Edwin Dada, MD [facility] 01/17/2018, [place] PM     Pager [phone] --- please page though AMION:  [email or web address] Password [email or web address] If 7PM-7AM, please contact night-coverage

## 2018-01-17 NOTE — Progress Notes (Signed)
Subjective Doing fine today - he denies any abdominal pain. He denies any n/v.  Objective: Vital signs in last 24 hours: Temp:  [96.7 F (35.9 C)-98.4 F (36.9 C)] 97.7 F (36.5 C) (08/19 0809) Pulse Rate:  [53-105] 53 (08/19 0809) Resp:  [12-21] 16 (08/19 0423) BP: (81-133)/(52-76) 101/64 (08/19 0809) SpO2:  [94 %-99 %] 98 % (08/19 0809) Weight:  [109.3 kg] 109.3 kg (08/18 2013) Last BM Date: 01/16/18  Intake/Output from previous day: 08/18 0701 - 08/19 0700 In: 537 [P.O.:300] Out: 1 [Stool:1] Intake/Output this shift: No intake/output data recorded.  Gen: NAD, comfortable CV: RRR Pulm: Normal work of breathing Abd: Soft, nontender, nondistended; negative Murphy's sign Ext: SCDs in place  Lab Results: CBC  Recent Labs    01/15/18 0702  WBC 18.4*  HGB 8.8*  HCT 27.3*  PLT 191   BMET Recent Labs    01/16/18 1708 01/17/18 0443  NA 140 140  K 4.6 4.5  CL 103 102  CO2 22 23  GLUCOSE 93 122*  BUN 51* 61*  CREATININE 6.40* 7.21*  CALCIUM 9.1 9.2   PT/INR Recent Labs    01/16/18 0349 01/17/18 0443  LABPROT 22.1* 20.0*  INR 1.96 1.72   ABG No results for input(s): PHART, HCO3 in the last 72 hours.  Invalid input(s): PCO2, PO2  Studies/Results:  Anti-infectives: Anti-infectives (From admission, onward)   Start     Dose/Rate Route Frequency Ordered Stop   01/14/18 1800  piperacillin-tazobactam (ZOSYN) IVPB 3.375 g  Status:  Discontinued     3.375 g 12.5 mL/hr over 240 Minutes Intravenous Every 12 hours 01/14/18 0807 01/14/18 1137   01/12/18 0500  vancomycin (VANCOCIN) IVPB 1000 mg/200 mL premix  Status:  Discontinued     1,000 mg 200 mL/hr over 60 Minutes Intravenous Every 24 hours 01/11/18 1534 01/12/18 0853   01/11/18 1800  vancomycin (VANCOCIN) IVPB 1000 mg/200 mL premix  Status:  Discontinued     1,000 mg 200 mL/hr over 60 Minutes Intravenous Every 24 hours 01/11/18 0143 01/11/18 1534   01/11/18 0600  piperacillin-tazobactam (ZOSYN) IVPB 3.375  g  Status:  Discontinued     3.375 g 100 mL/hr over 30 Minutes Intravenous Every 6 hours 01/11/18 0143 01/14/18 0807   01/10/18 0825  ceFAZolin (ANCEF) IVPB 2g/100 mL premix  Status:  Discontinued     2 g 200 mL/hr over 30 Minutes Intravenous 30 min pre-op 01/10/18 0825 01/10/18 1008   01/10/18 0400  piperacillin-tazobactam (ZOSYN) IVPB 3.375 g  Status:  Discontinued     3.375 g 12.5 mL/hr over 240 Minutes Intravenous Every 12 hours 01/09/18 1817 01/11/18 0143   01/10/18 0015  vancomycin (VANCOCIN) 2,500 mg in sodium chloride 0.9 % 500 mL IVPB     2,500 mg 250 mL/hr over 120 Minutes Intravenous  Once 01/10/18 0003 01/10/18 0239   01/09/18 1630  vancomycin (VANCOCIN) IVPB 1000 mg/200 mL premix  Status:  Discontinued     1,000 mg 200 mL/hr over 60 Minutes Intravenous  Once 01/09/18 1630 01/09/18 2336   01/09/18 1545  piperacillin-tazobactam (ZOSYN) IVPB 3.375 g     3.375 g 100 mL/hr over 30 Minutes Intravenous  Once 01/09/18 1539 01/09/18 1713   01/09/18 1545  vancomycin (VANCOCIN) IVPB 1000 mg/200 mL premix  Status:  Discontinued     1,000 mg 200 mL/hr over 60 Minutes Intravenous  Once 01/09/18 1539 01/09/18 2336       Assessment/Plan: Patient Active Problem List   Diagnosis Date Noted  .  Toxic metabolic encephalopathy 21/30/8657  . Elevated LFTs   . Idiopathic acute pancreatitis without infection or necrosis   . Acute respiratory failure with hypoxia ([place])   . Septic shock ([place])   . Cholecystitis   . Metabolic acidosis   . Pericardial effusion with cardiac tamponade   . Hypotension 01/09/2018  . Sepsis ([place]) 01/09/2018  . Abnormal nuclear stress test 09/11/2016  . Systolic heart failure ([place])   . History of colonic polyps   . Benign neoplasm of transverse colon   . ESRD (end stage renal disease) ([place]) 01/31/2013  . Chronic anticoagulation 12/27/2012  . Vitreous hemorrhage ([place]) 04/19/2012  . NSVT (nonsustained ventricular tachycardia) ([place]) 03/18/2012  . Noncompliance  03/16/2012  . Chest pain, no MI, negative myoview, most likely muscular sketal pain 03/15/2012  . Proliferative diabetic retinopathy associated with type 2 diabetes mellitus ([place]) 08/18/2011  . 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 Non Hodgkin's lymphoma ([place])   . Hyperlipidemia   . Diabetic nephropathy ([place])   . DVT (deep venous thrombosis) ([place])   . Healthcare-associated pneumonia 01/15/2011  . Anemia in chronic kidney disease 01/12/2011  . Warfarin-induced coagulopathy ([place]) 01/12/2011  . History of DVT of lower extremity 01/12/2011  . ABDOMINAL PAIN -GENERALIZED 10/24/2008  . PERSONAL HX COLONIC POLYPS 10/24/2008   s/p Procedure(s): SUBXYPHOID PERICARDIAL WINDOW 01/10/2018  A/P -LFTs almost normalized; total bilirubin remains elevated; will defer to GI for further workup and evaluation in the setting of recent shock liver -Cholelithiasis with a benign abdomen; does not appear to have cholecystitis -We remain available if additional questions/concerns arise or concerns resurface for cholecystitis. We will sign off at this time.   LOS: 8 days   Sharon Mt. Dema Severin, [name] [facility]

## 2018-01-17 NOTE — Progress Notes (Signed)
Progress Note  Patient Name: [name] Date of Encounter: 01/17/2018  Primary Cardiologist: [place]  Subjective   67 year old gentleman with a history of end-stage renal disease.  He presented with hypotension and rapid atrial fib.Marland Kitchen  He was found to have a large pericardial effusion secondary to uremia.  He is now status post pericardial window.   His hemodynamics have improved following the pericardial window.  He remains in atrial fibrillation.  Still appears very weak.  Somewhat slow to respond to questions.  Inpatient Medications    Scheduled Meds: . chlorhexidine  15 mL Mouth Rinse BID  . Chlorhexidine Gluconate Cloth  6 each Topical Q0600  . [START ON 01/18/2018] darbepoetin (ARANESP) injection - DIALYSIS  60 mcg Intravenous Q Tue-HD  . feeding supplement (ENSURE ENLIVE)  237 mL Oral TID BM  . feeding supplement (NEPRO CARB STEADY)  237 mL Oral BID BM  . feeding supplement (PRO-STAT SUGAR FREE 64)  30 mL Oral BID  . insulin aspart  2-6 Units Subcutaneous Q4H  . mouth rinse  15 mL Mouth Rinse q12n4p  . midodrine  5 mg Oral TID WC  . multivitamin  1 tablet Oral QHS  . sodium chloride flush  10-40 mL Intracatheter Q12H  . warfarin  7.5 mg Oral ONCE-1800  . Warfarin - Pharmacist Dosing Inpatient   Does not apply q1800   Continuous Infusions: . sodium chloride Stopped (01/14/18 0624)  . [START ON 01/18/2018] ferric gluconate (FERRLECIT/NULECIT) IV     PRN Meds: sodium chloride, fentaNYL (SUBLIMAZE) injection, LORazepam, ondansetron (ZOFRAN) IV, sodium chloride flush   Vital Signs    Vitals:   01/16/18 1803 01/16/18 2013 01/17/18 0423 01/17/18 0809  BP: (!) 105/58 (!) 103/52 108/66 101/64  Pulse: 69 82 65 (!) 53  Resp: 12 16 16    Temp: 97.9 F (36.6 C) 98.4 F (36.9 C) 98.4 F (36.9 C) 97.7 F (36.5 C)  TempSrc:    Oral  SpO2: 98% 95% 99% 98%  Weight:  109.3 kg    Height:        Intake/Output Summary (Last 24 hours) at 01/17/2018 1110 Last data filed at  01/17/2018 0830 Gross per 24 hour  Intake 60 ml  Output 1 ml  Net 59 ml   Filed Weights   01/15/18 1610 01/16/18 0500 01/16/18 2013  Weight: 107.2 kg 109.3 kg 109.3 kg    Telemetry     Atrial fib with controlled V response - Personally Reviewed  ECG     - Personally Reviewed  Physical Exam   GEN: middle age man,   Very weak.   NAD   Neck: No JVD Cardiac:  Irreg. Irreg.   Soft systolic murmur .  No rub   Respiratory: Clear to auscultation bilaterally. GI: Soft, nontender, non-distended  MS: No edema; No deformity. Neuro:   very weak  Psych:  Difficult to assess   Labs    Chemistry Recent Labs  Lab 01/13/18 0351  01/14/18 0348  01/15/18 1725 01/16/18 1708 01/17/18 0443  NA 137   < > 140   < > 138 140 140  K 4.1   < > 4.0   < > 2.9* 4.6 4.5  CL 100   < > 102   < > 100 103 102  CO2 23   < > 23   < > 26 22 23   GLUCOSE 142*   < > 142*   < > 197* 93 122*  BUN 21   < >  32*   < > 35* 51* 61*  CREATININE 2.80*   < > 3.75*   < > 4.36* 6.40* 7.21*  CALCIUM 8.4*   < > 9.0   < > 8.9 9.1 9.2  PROT 7.0  --  6.4*  --   --   --  5.8*  ALBUMIN 3.0*   < > 2.7*   < > 2.5* 2.4* 2.3*  AST 1,788*  --  565*  --   --   --  89*  ALT 4,017*  --  2,540*  --   --   --  840*  ALKPHOS 208*  --  193*  --   --   --  182*  BILITOT 3.9*  --  3.9*  --   --   --  3.2*  GFRNONAA 22*   < > 16*   < > 13* 8* 7*  GFRAA 26*   < > 18*   < > 15* 10* 8*  ANIONGAP 14   < > 15   < > 12 15 15    < > = values in this interval not displayed.     Hematology Recent Labs  Lab 01/13/18 0351 01/14/18 0348 01/15/18 0702  WBC 15.3* 14.0* 18.4*  RBC 3.21* 2.91* 2.98*  HGB 9.4* 8.6* 8.8*  HCT 30.1* 27.3* 27.3*  MCV 93.8 93.8 91.6  MCH 29.3 29.6 29.5  MCHC 31.2 31.5 32.2  RDW 16.5* 16.7* 16.8*  PLT 193 164 191    Cardiac Enzymes Recent Labs  Lab 01/11/18 0412  TROPONINI 2.35*   No results for input(s): TROPIPOC in the last 168 hours.   BNPNo results for input(s): BNP, PROBNP in the last 168  hours.   DDimer No results for input(s): DDIMER in the last 168 hours.   Radiology    No results found.  Cardiac Studies     Patient Profile     67 y.o. male on hemodialysis.  Presented with a large pericardial effusion and evidence of tamponade.  He also was found to have atrial flutter/atrial fibrillation.  Assessment & Plan    1.  Pericardial tamponade: The patient had a large pericardial effusion thought to be due to uremia.  His hemodynamics have improved remarkably following the pericardial window. Follow-up echo on August 16 revealed no evidence of recurrent pericardial effusion.  The effusion was a hemorrhagic effusion.  His INR was elevated at that time.  Will hold off on restarting coumadin at this time.  He was on coumadin for an old DVT.   ( Now has been found to have Afib)   2.   Atrial fibrillation /atrial flutter: We will recheck EKG today.  The patient appears to still be in atrial fibrillation.  3.  Chronic systolic congestive heart failure: Recent echo shows an ejection fraction of 30 to 35%.      For questions or updates, please contact [place] Please consult [email or web address] for contact info under Cardiology/STEMI.      Signed, Mertie Moores, MD  01/17/2018, 11:10 AM

## 2018-01-17 NOTE — Progress Notes (Signed)
[place] for Warfarin Management  Indication: History of DVT and pAF  No Known Allergies  Patient Measurements: Height: 6' 3" (190.5 cm) Weight: 240 lb 15.4 oz (109.3 kg) IBW/kg (Calculated) : 84.5  Vital Signs: Temp: 97.7 F (36.5 C) (08/19 0809) Temp Source: Oral (08/19 0809) BP: 101/64 (08/19 0809) Pulse Rate: 53 (08/19 0809)  Labs: Recent Labs    01/15/18 0332 01/15/18 0702 01/15/18 1725 01/16/18 0349 01/16/18 1708 01/17/18 0443  HGB  --  8.8*  --   --   --   --   HCT  --  27.3*  --   --   --   --   PLT  --  191  --   --   --   --   LABPROT 24.3*  --   --  22.1*  --  20.0*  INR 2.20  --   --  1.96  --  1.72  CREATININE  --  5.82* 4.36*  --  6.40* 7.21*   Hepatic Function Latest Ref Rng & Units 01/17/2018 01/16/2018 01/15/2018  Total Protein 6.5 - 8.1 g/dL 5.8(L) - -  Albumin 3.5 - 5.0 g/dL 2.3(L) 2.4(L) 2.5(L)  AST 15 - 41 U/L 89(H) - -  ALT 0 - 44 U/L 840(H) - -  Alk Phosphatase 38 - 126 U/L 182(H) - -  Total Bilirubin 0.3 - 1.2 mg/dL 3.2(H) - -  Bilirubin, Direct 0.0 - 0.2 mg/dL - - -    Estimated Creatinine Clearance: 13.8 mL/min (A) (by C-G formula based on SCr of 7.21 mg/dL (H)).   Medical History: Past Medical History:  Diagnosis Date  . Anemia   . Arthritis    HNP- lumbar, "all over my body"  . Blood transfusion    "years ago; blood was low" (08/05/2013)  . CKD (chronic kidney disease) stage 4, GFR 15-29 ml/min (HCC) 03/18/2012   [place]- T,TH,Sat.  . Diabetic nephropathy ([place])   . Diabetic retinopathy   . DVT (deep venous thrombosis) ([place])    "got one in my right leg now; I've had one before too, not sure which leg" (08/05/2013)  . ESRD (end stage renal disease) on dialysis [facility])    "just started today, (08/04/2013)"  . Family history of anesthesia complication    " my son wakes up slowly"  . GERD (gastroesophageal reflux disease)    uses alka seltzere on occas.   [name])    "one q  now and then" (08/05/2013)  . Hyperlipidemia   . Hypertension   . 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 Nodular lymphoma of intra-abdominal lymph nodes ([place])   . Non Hodgkin's lymphoma ([place])    Tx 2009; "had chemo; it went away" (08/05/2013)  . Noncompliance 03/16/2012  . NSVT (nonsustained ventricular tachycardia) ([place]) 03/18/2012  . Peripheral vascular disease ([place])   . Pneumonia 2013   hosp.-   . Poor historian    pt. unsure of several answers to health history questions   . Skin cancer    melanoma - head  . Sleep apnea    "suppose to have a sleep study, but they never told me when. (08/05/2013)    Medications:  Scheduled:  . chlorhexidine  15 mL Mouth Rinse BID  . Chlorhexidine Gluconate Cloth  6 each Topical Daily  . Chlorhexidine Gluconate Cloth  6 each Topical Q0600  . feeding supplement (ENSURE ENLIVE)  237 mL Oral TID BM  .  insulin aspart  2-6 Units Subcutaneous Q4H  . mouth rinse  15 mL Mouth Rinse q12n4p  . midodrine  5 mg Oral TID WC  . multivitamin  1 tablet Oral QHS  . sodium chloride flush  10-40 mL Intracatheter Q12H   Infusions:  . sodium chloride Stopped (01/14/18 8182)    Assessment: Pt is a 62 yoM with a history of DVT (15 years ago per pt) receiving warfarin PTA. During admission, pt developed atrial fibrillation. Home warfarin dose is 7.5 mg daily except 10 mg Mon/Thurs. Warfarin has been held since admission secondary to elevated INR, peaked at 10 has downtrended. Of note, patient received 10 mg [place] phytonadione and a total of 20 mg IV phytonadione on 8/13.   Plan to restart warfarin lower than home dose given shock liver.  LFTs continue to decrease, but remain significantly elevated.  Patient was scheduled to be given warfarin 8/15, but due to combative nature warfarin was unable to be administered. INR increased despite warfarin not being administered (1.73 > 2.06 > 2.2 > 1.96>>1.72), likely due to shock liver.. No signs/symptoms of  bleeding noted by nursing.   Goal of Therapy:  INR 2-3 Monitor platelets by anticoagulation protocol: Yes   Plan:  Warfarin 7.5 mg x 1 Daily INR Monitor for s/sx of bleeding     A. Levada Dy, PharmD, [place] Pager: [phone] Please utilize Amion for appropriate phone number to reach the unit pharmacist ([place])    01/17/2018 8:23 AM

## 2018-01-17 NOTE — Progress Notes (Addendum)
[facility] Progress Note   Dialysis Orders: TTS Reds 4.25 hr EDW 10 425/800 profile 4 3 K 2.5 Ca right upper AVF heparin 3000 hectorol 6 venofer 50 last mircera was 30 in May  Assessment/Plan: 1. S/p pericardial effusion/window 2. ESRD -TTS HD tomorrow K 4.5 - apparently AVF functional problems earlier why hypotensive -required short term catheter - all central lines out - AVF + bruit this am.- last intervention 08/02/17 Dr.Lin 50-60% stenosis - 3. Anemia - hgb 8.8 - last tsat 37% in July - check Fe studies Tuesday pre dialysis and resume ESA/weekly Fe 4. Secondary hyperparathyroidism - off Hectorol for now -wasn't started this admission and no corrected Ca is elevated - 5. HTN/volume - midodrine for BP support 6. Nutrition - alb 2.3 , add prostat/nepro - adv diet as able - currently on FL 7. Shock - LFTs coming down, CRRT earlier in this admission 8.  Afib/ hx DVT 9. Coagulopathy - corrected/ coumadin per [place], PA-C [place] [phone] 01/17/2018,10:06 AM  LOS: 8 days   Pt seen, examined and agree w A/P as above.  Kelly Splinter MD [facility] pager [phone]   01/17/2018, 3:55 PM    Subjective:   He doesn't notice that right arm is larger than left  Objective Vitals:   01/16/18 1803 01/16/18 2013 01/17/18 0423 01/17/18 0809  BP: (!) 105/58 (!) 103/52 108/66 101/64  Pulse: 69 82 65 (!) 53  Resp: 12 16 16    Temp: 97.9 F (36.6 C) 98.4 F (36.9 C) 98.4 F (36.9 C) 97.7 F (36.5 C)  TempSrc:    Oral  SpO2: 98% 95% 99% 98%  Weight:  109.3 kg    Height:       Physical Exam General: NAD flat affect, slow mentation Heart: irreg irreg Lungs: dim BS Abdomen: dim BS Extremities: SCDs, tr LE edema Dialysis Access:  Right upper AVF + bruit - marked right arm swelling c/w left- I don't see that he had any intervention this admission - AF as outpatient has been 800s since May   Additional  Objective Labs: Basic Metabolic Panel: Recent Labs  Lab 01/15/18 0702 01/15/18 1725 01/16/18 1708 01/17/18 0443  NA 141 138 140 140  K 3.6 2.9* 4.6 4.5  CL 104 100 103 102  CO2 24 26 22 23   GLUCOSE 172* 197* 93 122*  BUN 54* 35* 51* 61*  CREATININE 5.82* 4.36* 6.40* 7.21*  CALCIUM 9.6 8.9 9.1 9.2  PHOS 3.6 2.6 3.5  --    Liver Function Tests: Recent Labs  Lab 01/13/18 0351  01/14/18 0348  01/15/18 1725 01/16/18 1708 01/17/18 0443  AST 1,788*  --  565*  --   --   --  89*  ALT 4,017*  --  2,540*  --   --   --  840*  ALKPHOS 208*  --  193*  --   --   --  182*  BILITOT 3.9*  --  3.9*  --   --   --  3.2*  PROT 7.0  --  6.4*  --   --   --  5.8*  ALBUMIN 3.0*   < > 2.7*   < > 2.5* 2.4* 2.3*   < > = values in this interval not displayed.   Recent Labs  Lab 01/11/18 0412 01/12/18 0346 01/13/18 0351  LIPASE 117* 41 35   CBC: Recent Labs  Lab 01/11/18 0815 01/12/18 0346 01/13/18 0351 01/14/18  3875 01/15/18 0702  WBC 16.8* 12.8* 15.3* 14.0* 18.4*  HGB 9.7* 9.0* 9.4* 8.6* 8.8*  HCT 29.8* 27.4* 30.1* 27.3* 27.3*  MCV 90.9 88.4 93.8 93.8 91.6  PLT 234 219 193 164 191   Blood Culture    Component Value Date/Time   SDES FLUID PERICARDIAL 01/10/2018 1015   SPECREQUEST B 01/10/2018 1015   CULT  01/10/2018 1015    NO GROWTH 3 DAYS Performed at [facility] [address] [place], [place] [address]    REPTSTATUS 01/13/2018 FINAL 01/10/2018 1015    Cardiac Enzymes: Recent Labs  Lab 01/11/18 0412  TROPONINI 2.35*   CBG: Recent Labs  Lab 01/16/18 1745 01/16/18 2013 01/16/18 2345 01/17/18 0424 01/17/18 0820  GLUCAP 103* 170* 142* 128* 128*   Iron Studies: No results for input(s): IRON, TIBC, TRANSFERRIN, FERRITIN in the last 72 hours. Lab Results  Component Value Date   INR 1.72 01/17/2018   INR 1.96 01/16/2018   INR 2.20 01/15/2018   Studies/Results: No results found. Medications: . sodium chloride Stopped (01/14/18 0624)   .  chlorhexidine  15 mL Mouth Rinse BID  . Chlorhexidine Gluconate Cloth  6 each Topical Daily  . Chlorhexidine Gluconate Cloth  6 each Topical Q0600  . feeding supplement (ENSURE ENLIVE)  237 mL Oral TID BM  . insulin aspart  2-6 Units Subcutaneous Q4H  . mouth rinse  15 mL Mouth Rinse q12n4p  . midodrine  5 mg Oral TID WC  . multivitamin  1 tablet Oral QHS  . sodium chloride flush  10-40 mL Intracatheter Q12H  . warfarin  7.5 mg Oral ONCE-1800  . Warfarin - Pharmacist Dosing Inpatient   Does not apply [phone]

## 2018-01-17 NOTE — NC FL2 (Signed)
[place] LEVEL OF CARE SCREENING TOOL     IDENTIFICATION  Patient Name: [name] Birthdate: 02-02-1951 Sex: male Admission Date (Current Location): 01/09/2018  [facility] and [place] Number:  [employer] and Address:  [facility] [place], [place] [address] [place], [place] [address]      Provider Number: [id number]  Attending Physician Name and Address:  Edwin Dada, *  Relative Name and Phone Number:  [name] - son, [phone]     Current Level of Care: Hospital Recommended Level of Care: [place] Prior Approval Number:    Date Approved/Denied:   PASRR Number: [id number] A(Eff. 08/07/13)  Discharge Plan: SNF    Current Diagnoses: Patient Active Problem List   Diagnosis Date Noted  . Toxic metabolic encephalopathy 92/33/0076  . Elevated LFTs   . Idiopathic acute pancreatitis without infection or necrosis   . Acute respiratory failure with hypoxia ([place])   . Septic shock ([place])   . Cholecystitis   . Metabolic acidosis   . Pericardial effusion with cardiac tamponade   . Hypotension 01/09/2018  . Sepsis ([place]) 01/09/2018  . Abnormal nuclear stress test 09/11/2016  . Systolic heart failure ([place])   . History of colonic polyps   . Benign neoplasm of transverse colon   . ESRD (end stage renal disease) ([place]) 01/31/2013  . Chronic anticoagulation 12/27/2012  . Vitreous hemorrhage ([place]) 04/19/2012  . NSVT (nonsustained ventricular tachycardia) ([place]) 03/18/2012  . Noncompliance 03/16/2012  . Chest pain, no MI, negative myoview, most likely muscular sketal pain 03/15/2012  . Proliferative diabetic retinopathy associated with type 2 diabetes mellitus ([place]) 08/18/2011  . 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 Non Hodgkin's lymphoma ([place])   . Hyperlipidemia   . Diabetic nephropathy ([place])   . DVT (deep venous thrombosis) ([place])   . Healthcare-associated pneumonia 01/15/2011  . Anemia in  chronic kidney disease 01/12/2011  . Warfarin-induced coagulopathy ([place]) 01/12/2011  . History of DVT of lower extremity 01/12/2011  . ABDOMINAL PAIN -GENERALIZED 10/24/2008  . PERSONAL HX COLONIC POLYPS 10/24/2008    Orientation RESPIRATION BLADDER Height & Weight     Self, Place  Normal Continent Weight: 240 lb 15.4 oz (109.3 kg) Height:  6\' 3"  (190.5 cm)  BEHAVIORAL SYMPTOMS/MOOD NEUROLOGICAL BOWEL NUTRITION STATUS      Continent Diet  AMBULATORY STATUS COMMUNICATION OF NEEDS Skin   Total Care(Did not ambulate with PT during evaluation) Verbally Other (Comment)(Skin tear left wrist with foam dressing)                       Personal Care Assistance Level of Assistance  Bathing, Feeding, Dressing Bathing Assistance: Maximum assistance Feeding assistance: Independent Dressing Assistance: Maximum assistance     Functional Limitations Info  Sight, Hearing, Speech Sight Info: Impaired Hearing Info: Adequate Speech Info: Adequate    SPECIAL CARE FACTORS FREQUENCY  PT (By licensed PT), Speech therapy     PT Frequency: Evaluation 8/17 and a minimum of 2X per week therapy recommended during acute inpatient stay       Speech Therapy Frequency: Evaluation 8/18 and a minimum of 2X per week therapy recommended during acute inpatient stay      Contractures Contractures Info: Not present    Additional Factors Info  Code Status, Allergies, Insulin Sliding Scale Code Status Info: Full Allergies Info: No known allergies   Insulin Sliding Scale Info: 0-4 Units every 4 hours  Current Medications (01/17/2018):  This is the current hospital active medication list Current Facility-Administered Medications  Medication Dose Route Frequency Provider Last Rate Last Dose  . 0.9 %  sodium chloride infusion   Intravenous PRN Hammonds, Sharyn Blitz, MD   Stopped at 01/14/18 [phone]  . chlorhexidine (PERIDEX) 0.12 % solution 15 mL  15 mL Mouth Rinse BID Hammonds, Sharyn Blitz, MD   15 mL  at 01/17/18 1050  . Chlorhexidine Gluconate Cloth 2 % PADS 6 each  6 each Topical Q0600 Alric Seton, PA-C      . [START ON 01/18/2018] Darbepoetin Alfa (ARANESP) injection 60 mcg  60 mcg Intravenous Q Tue-HD Alric Seton, PA-C      . feeding supplement (ENSURE ENLIVE) (ENSURE ENLIVE) liquid 237 mL  237 mL Oral TID BM Agarwala, Einar Grad, MD   237 mL at 01/17/18 1049  . feeding supplement (NEPRO CARB STEADY) liquid 237 mL  237 mL Oral BID BM Alric Seton, PA-C      . feeding supplement (PRO-STAT SUGAR FREE 64) liquid 30 mL  30 mL Oral BID Alric Seton, PA-C      . fentaNYL (SUBLIMAZE) injection 12.5-25 mcg  12.5-25 mcg Intravenous Q2H PRN Noe Gens L, NP   12.5 mcg at 01/13/18 0412  . [START ON 01/18/2018] ferric gluconate (NULECIT) 62.5 mg in sodium chloride 0.9 % 100 mL IVPB  62.5 mg Intravenous Q Tue-HD Alric Seton, PA-C      . insulin aspart (novoLOG) injection 2-6 Units  2-6 Units Subcutaneous Q4H Rexene Alberts, MD   2 Units at 01/17/18 0827  . LORazepam (ATIVAN) injection 0.5 mg  0.5 mg Intravenous Q8H PRN Icard, Bradley L, DO   0.5 mg at 01/15/18 1341  . MEDLINE mouth rinse  15 mL Mouth Rinse q12n4p Hammonds, Sharyn Blitz, MD   15 mL at 01/16/18 1235  . midodrine (PROAMATINE) tablet 5 mg  5 mg Oral TID WC Kipp Brood, MD   5 mg at 01/17/18 0828  . multivitamin (RENA-VIT) tablet 1 tablet  1 tablet Oral QHS Kipp Brood, MD   1 tablet at 01/16/18 2155  . ondansetron (ZOFRAN) injection 4 mg  4 mg Intravenous Q6H PRN Rexene Alberts, MD      . sodium chloride flush (NS) 0.9 % injection 10-40 mL  10-40 mL Intracatheter Q12H Hammonds, Sharyn Blitz, MD   10 mL at 01/16/18 0920  . sodium chloride flush (NS) 0.9 % injection 10-40 mL  10-40 mL Intracatheter PRN Hammonds, Sharyn Blitz, MD      . warfarin (COUMADIN) tablet 7.5 mg  7.5 mg Oral ONCE-1800 Pierce, Dwayne A, RPH      . Warfarin - Pharmacist Dosing Inpatient   Does not apply q1800 Theotis Burrow, [facility]         Discharge  Medications: Please see discharge summary for a list of discharge medications.  Relevant Imaging Results:  Relevant Lab Results:   Additional Information [id number]. Dialysis patient [place].  Sable Feil, LCSW

## 2018-01-18 DIAGNOSIS — E440 Moderate protein-calorie malnutrition: Secondary | ICD-10-CM

## 2018-01-18 DIAGNOSIS — I9589 Other hypotension: Secondary | ICD-10-CM

## 2018-01-18 DIAGNOSIS — I5042 Chronic combined systolic (congestive) and diastolic (congestive) heart failure: Secondary | ICD-10-CM

## 2018-01-18 LAB — RENAL FUNCTION PANEL
Albumin: 2.2 g/dL — ABNORMAL LOW (ref 3.5–5.0)
Albumin: 1.9 g/dL — ABNORMAL LOW (ref 3.5–5.0)
Anion gap: 17 — ABNORMAL HIGH (ref 5–15)
Anion gap: 16 — ABNORMAL HIGH (ref 5–15)
BUN: 82 mg/dL — ABNORMAL HIGH (ref 8–23)
BUN: 29 mg/dL — ABNORMAL HIGH (ref 8–23)
CO2: 23 mmol/L (ref 22–32)
CO2: 24 mmol/L (ref 22–32)
Calcium: 9.1 mg/dL (ref 8.9–10.3)
Calcium: 8.3 mg/dL — ABNORMAL LOW (ref 8.9–10.3)
Chloride: 98 mmol/L (ref 98–111)
Chloride: 100 mmol/L (ref 98–111)
Creatinine, Ser: 8.74 mg/dL — ABNORMAL HIGH (ref 0.61–1.24)
Creatinine, Ser: 4.75 mg/dL — ABNORMAL HIGH (ref 0.61–1.24)
GFR calc Af Amer: 7 mL/min — ABNORMAL LOW (ref >60)
GFR calc Af Amer: 14 mL/min — ABNORMAL LOW (ref >60)
GFR calc non Af Amer: 6 mL/min — ABNORMAL LOW (ref >60)
GFR calc non Af Amer: 12 mL/min — ABNORMAL LOW (ref >60)
Glucose, Bld: 117 mg/dL — ABNORMAL HIGH (ref 70–99)
Glucose, Bld: 222 mg/dL — ABNORMAL HIGH (ref 70–99)
Phosphorus: 4.4 mg/dL (ref 2.5–4.6)
Phosphorus: 2.6 mg/dL (ref 2.5–4.6)
Potassium: 3.5 mmol/L (ref 3.5–5.1)
Potassium: 4.9 mmol/L (ref 3.5–5.1)
Sodium: 138 mmol/L (ref 135–145)
Sodium: 140 mmol/L (ref 135–145)

## 2018-01-18 LAB — GLUCOSE, CAPILLARY
Glucose-Capillary: 125 mg/dL — ABNORMAL HIGH (ref 70–99)
Glucose-Capillary: 161 mg/dL — ABNORMAL HIGH (ref 70–99)
Glucose-Capillary: 254 mg/dL — ABNORMAL HIGH (ref 70–99)
Glucose-Capillary: 286 mg/dL — ABNORMAL HIGH (ref 70–99)
Glucose-Capillary: 91 mg/dL (ref 70–99)

## 2018-01-18 LAB — IRON AND TIBC
Iron: 57 ug/dL (ref 45–182)
Saturation Ratios: 33 % (ref 17.9–39.5)
TIBC: 175 ug/dL — ABNORMAL LOW (ref 250–450)
UIBC: 118 ug/dL

## 2018-01-18 LAB — PROTIME-INR
INR: 1.83
Prothrombin Time: 21 seconds — ABNORMAL HIGH (ref 11.4–15.2)

## 2018-01-18 LAB — CBC
HCT: 29.1 % — ABNORMAL LOW (ref 39.0–52.0)
Hemoglobin: 9.7 g/dL — ABNORMAL LOW (ref 13.0–17.0)
MCH: 29.7 pg (ref 26.0–34.0)
MCHC: 33.3 g/dL (ref 30.0–36.0)
MCV: 89 fL (ref 78.0–100.0)
Platelets: 159 10*3/uL (ref 150–400)
RBC: 3.27 MIL/uL — ABNORMAL LOW (ref 4.22–5.81)
RDW: 19.3 % — ABNORMAL HIGH (ref 11.5–15.5)
WBC: 13.3 10*3/uL — ABNORMAL HIGH (ref 4.0–10.5)

## 2018-01-18 LAB — FERRITIN: Ferritin: 4025 ng/mL — ABNORMAL HIGH (ref 24–336)

## 2018-01-18 MED ORDER — SODIUM CHLORIDE 0.9 % IV SOLN: 100.0000 mL | INTRAVENOUS | Status: DC | PRN

## 2018-01-18 MED ORDER — PRO-STAT SUGAR FREE PO LIQD: 30.0000 mL | Freq: Every day | ORAL | Status: DC

## 2018-01-18 MED ORDER — HEPARIN SODIUM (PORCINE) 1000 UNIT/ML DIALYSIS: 1000.0000 [IU] | INTRAMUSCULAR | Status: DC | PRN

## 2018-01-18 MED ORDER — WARFARIN SODIUM 5 MG PO TABS
5.0000 mg | ORAL_TABLET | Freq: Once | ORAL | Status: AC
  Administered 2018-01-18: 5 mg via ORAL
  Filled 2018-01-18 (×2): qty 1

## 2018-01-18 MED ORDER — PENTAFLUOROPROP-TETRAFLUOROETH EX AERO: 1.0000 "application " | INHALATION_SPRAY | CUTANEOUS | Status: DC | PRN

## 2018-01-18 MED ORDER — MIDODRINE HCL 5 MG PO TABS
ORAL_TABLET | ORAL | Status: AC
  Filled 2018-01-18: qty 1

## 2018-01-18 MED ORDER — HEPARIN SODIUM (PORCINE) 1000 UNIT/ML DIALYSIS: 20.0000 [IU]/kg | INTRAMUSCULAR | Status: DC | PRN

## 2018-01-18 MED ORDER — LIDOCAINE-PRILOCAINE 2.5-2.5 % EX CREA: 1.0000 "application " | TOPICAL_CREAM | CUTANEOUS | Status: DC | PRN

## 2018-01-18 MED ORDER — ATORVASTATIN CALCIUM 40 MG PO TABS
40.0000 mg | ORAL_TABLET | Freq: Every day | ORAL | Status: DC
  Administered 2018-01-18 – 2018-01-20 (×3): 40 mg via ORAL
  Filled 2018-01-18 (×3): qty 1

## 2018-01-18 MED ORDER — LIDOCAINE HCL (PF) 1 % IJ SOLN: 5.0000 mL | INTRAMUSCULAR | Status: DC | PRN

## 2018-01-18 MED ORDER — VITAL 1.5 CAL PO LIQD
1000.0000 mL | ORAL | Status: DC
  Filled 2018-01-18: qty 1000

## 2018-01-18 MED ORDER — DARBEPOETIN ALFA 60 MCG/0.3ML IJ SOSY
PREFILLED_SYRINGE | INTRAMUSCULAR | Status: AC
  Administered 2018-01-18: 60 ug via INTRAVENOUS
  Filled 2018-01-18: qty 0.3

## 2018-01-18 MED ORDER — ALTEPLASE 2 MG IJ SOLR: 2.0000 mg | Freq: Once | INTRAMUSCULAR | Status: DC | PRN

## 2018-01-18 NOTE — Progress Notes (Signed)
[facility] for Warfarin Management  Indication: History of DVT and pAF  No Known Allergies  Patient Measurements: Height: '6\' 3"'  (190.5 cm) Weight: 244 lb 11.4 oz (111 kg)(weighed in bed ) IBW/kg (Calculated) : 84.5  Vital Signs: Temp: 98.8 F (37.1 C) (08/20 0725) Temp Source: Oral (08/20 0725) BP: 113/55 (08/20 0930) Pulse Rate: 70 (08/20 0930)  Labs: Recent Labs    01/16/18 0349  01/17/18 0443 01/17/18 1525 01/18/18 0755  HGB  --   --   --   --  9.7*  HCT  --   --   --   --  29.1*  PLT  --   --   --   --  159  LABPROT 22.1*  --  20.0*  --  21.0*  INR 1.96  --  1.72  --  1.83  CREATININE  --    < > 7.21* 7.67* 8.74*   < > = values in this interval not displayed.   Hepatic Function Latest Ref Rng & Units 01/18/2018 01/17/2018 01/17/2018  Total Protein 6.5 - 8.1 g/dL - - 5.8(L)  Albumin 3.5 - 5.0 g/dL 2.2(L) 2.3(L) 2.3(L)  AST 15 - 41 U/L - - 89(H)  ALT 0 - 44 U/L - - 840(H)  Alk Phosphatase 38 - 126 U/L - - 182(H)  Total Bilirubin 0.3 - 1.2 mg/dL - - 3.2(H)  Bilirubin, Direct 0.0 - 0.2 mg/dL - - -    Estimated Creatinine Clearance: 11.5 mL/min (A) (by C-G formula based on SCr of 8.74 mg/dL (H)).   Medical History: Past Medical History:  Diagnosis Date  . Anemia   . Arthritis    HNP- lumbar, "all over my body"  . Blood transfusion    "years ago; blood was low" (08/05/2013)  . CKD (chronic kidney disease) stage 4, GFR 15-29 ml/min (HCC) 03/18/2012   [place]- T,TH,Sat.  . Diabetic nephropathy ([place])   . Diabetic retinopathy   . DVT (deep venous thrombosis) ([place])    "got one in my right leg now; I've had one before too, not sure which leg" (08/05/2013)  . ESRD (end stage renal disease) on dialysis [facility])    "just started today, (08/04/2013)"  . Family history of anesthesia complication    " my son wakes up slowly"  . GERD (gastroesophageal reflux disease)    uses alka seltzere on occas.   [name])    "one q now and then" (08/05/2013)  . Hyperlipidemia   . Hypertension   . 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 Nodular lymphoma of intra-abdominal lymph nodes ([place])   . Non Hodgkin's lymphoma ([place])    Tx 2009; "had chemo; it went away" (08/05/2013)  . Noncompliance 03/16/2012  . NSVT (nonsustained ventricular tachycardia) ([place]) 03/18/2012  . Peripheral vascular disease ([place])   . Pneumonia 2013   hosp.-   . Poor historian    pt. unsure of several answers to health history questions   . Skin cancer    melanoma - head  . Sleep apnea    "suppose to have a sleep study, but they never told me when. (08/05/2013)    Medications:  Scheduled:  . chlorhexidine  15 mL Mouth Rinse BID  . Chlorhexidine Gluconate Cloth  6 each Topical Q0600  . darbepoetin (ARANESP) injection - DIALYSIS  60 mcg Intravenous Q Tue-HD  . feeding supplement (ENSURE ENLIVE)  237 mL Oral TID BM  .  feeding supplement (NEPRO CARB STEADY)  237 mL Oral BID BM  . feeding supplement (PRO-STAT SUGAR FREE 64)  30 mL Oral BID  . insulin aspart  2-6 Units Subcutaneous Q4H  . mouth rinse  15 mL Mouth Rinse q12n4p  . midodrine  5 mg Oral TID WC  . multivitamin  1 tablet Oral QHS  . sodium chloride flush  10-40 mL Intracatheter Q12H  . Warfarin - Pharmacist Dosing Inpatient   Does not apply q1800   Infusions:  . sodium chloride Stopped (01/14/18 0624)  . sodium chloride    . sodium chloride    . feeding supplement (JEVITY 1.2 CAL)    . ferric gluconate (FERRLECIT/NULECIT) IV      Assessment: Pt is a 64 yoM with a history of DVT (15 years ago per pt) receiving warfarin PTA. During admission, pt developed atrial fibrillation. Home warfarin dose is 7.5 mg daily except 10 mg Mon/Thurs. Warfarin has been held since admission secondary to elevated INR, peaked at 10 has downtrended. Of note, patient received 10 mg [place] phytonadione and a total of 20 mg IV phytonadione on 8/13.   Plan to restart warfarin lower  than home dose given shock liver.  LFTs continue to decrease, but remain significantly elevated.  Patient was scheduled to be given warfarin 8/15, but due to combative nature warfarin was unable to be administered. INR increased despite warfarin not being administered (1.73 > 2.06 > 2.2 > 1.96>>1.72), likely due to shock liver. INR 1.83 today. No signs/symptoms of bleeding noted by nursing.   Goal of Therapy:  INR 2-3 Monitor platelets by anticoagulation protocol: Yes   Plan:  Warfarin 5 mg x 1 Daily INR Monitor for s/sx of bleeding    Arvada Seaborn A. Levada Dy, PharmD, [place] Pager: [phone] Please utilize Amion for appropriate phone number to reach the unit pharmacist ([place])    01/18/2018 9:59 AM

## 2018-01-18 NOTE — Progress Notes (Addendum)
Nutrition Follow-up / Consult  DOCUMENTATION CODES:   Non-severe (moderate) malnutrition in context of chronic illness(May have element of acute malnutrition as well)  INTERVENTION:    With improved mentation, consider advancing diet to regular.  Continue to encourage intake of currently ordered supplements.  If feeding tube placed, start TF:  Vital 1.5 at 25 ml/h, increase by 10 ml every 4 hours to goal rate of 65 ml/h  Pro-stat 30 ml once daily  Provides 2440 kcal, 121 gm protein, 1186 ml free water daily to meet 100% of estimated nutrition needs  NUTRITION DIAGNOSIS:   Moderate Malnutrition related to chronic illness(ESRD on HD, CM EF 20%; exacerbated by acute illness) as evidenced by moderate muscle depletion, moderate fat depletion.  Ongoing  GOAL:   Patient will meet greater than or equal to 90% of their needs  Unmet, being addressed with feeding tube placement and TF initiation  MONITOR:   PO intake, Supplement acceptance, Labs, Weight trends, TF tolerance  REASON FOR ASSESSMENT:   Consult Enteral/tube feeding initiation and management  ASSESSMENT:   67 yo male admitted with septic and cardiogenic shock, acute respiratory failure requiring vent support, pericaridal effusion taking emergently to OR. Pt with hx of ESRD on HD, DM, HTN, NICM EF 20%, GERD, HLD, PVD  S/P HD today. Patient has not had anything to eat so far today.  Placement of feeding tube being considered. Patient seems more alert today per RN and physician. RN to assist patient with his lunch meal and giving PO supplements today. If intake remains poor, Cortrak to be placed tomorrow. Received MD Consult for TF initiation and management.  Patient remains on full liquid diet with minimal intake. He is being offered Magic cup, Ensure Enlive, and Pro-stat supplements, but he is refusing to take them. Meal completion 0%.  Labs reviewed. Sodium, potassium, & phosphorus are all WNL. CBG's:  161-125 Medications reviewed and include Rena-vit, Aranesp.  Diet Order:   Diet Order            Diet full liquid Room service appropriate? Yes; Fluid consistency: Thin  Diet effective now              EDUCATION NEEDS:   Not appropriate for education at this time  Skin:  Skin Assessment: Skin Integrity Issues: Skin Integrity Issues:: Incisions Incisions: chest  Last BM:  8/20  Height:   Ht Readings from Last 1 Encounters:  01/09/18 6\' 3"  (1.905 m)    Weight:   Wt Readings from Last 1 Encounters:  01/18/18 108.5 kg    Ideal Body Weight:  89 kg  BMI:  Body mass index is 29.9 kg/m.  Estimated Nutritional Needs:   Kcal:  2400-2600 kcals   Protein:  120-130 g  Fluid:  1000 mL plus UOP    Molli Barrows, RD, LDN, [place] Pager [phone] After Hours Pager [phone]

## 2018-01-18 NOTE — Progress Notes (Signed)
PT Cancellation Note  Patient Details Name: [name] MRN: [id number] DOB: 1950/09/13   Cancelled Treatment:    Reason Eval/Treat Not Completed: Patient at procedure or test/unavailable   Currently in HD;  Will follow,   Roney Marion, PT  Acute Rehabilitation Services Pager [phone] Office [place] 01/18/2018, 8:34 AM

## 2018-01-18 NOTE — Progress Notes (Signed)
After educating patient on importance of nutrition, patient is agreeable to eat meals. Patient completed one 240 ml of ensure and ate 100% of full liquid diet. MD notified. Orders placed. Will continue to monitor.

## 2018-01-18 NOTE — Progress Notes (Signed)
SLP Cancellation Note  Patient Details Name: [name] MRN: [id number] DOB: [birth year]   Cancelled treatment:       Reason Eval/Treat Not Completed: Patient at procedure or test/unavailable(In HD)  [name] MA, CCC-SLP   [name] 01/18/2018, 11:19 AM

## 2018-01-18 NOTE — Progress Notes (Signed)
PROGRESS NOTE    [name]  ZTI:[id number] DOB: [birth year] DOA: 01/09/2018 PCP: Burnard Bunting, MD      Brief Narrative:  [name] is a 67 y.o. M with ESRD on HD, HTN, DM, hx of NHL, PVD, hx of IVC, hx of pAF on warfarin, and CHF EF 20% who presented from the dialysis center with malaise, hypotension, found to have pericardial effusion with tamponade.  Initially presented with malaise and hypotension.  Work-up showed a left lower lobe infiltrate on chest x-ray, INR >10, lactate 10, he was started on vancomycin and Zosyn and admitted to the ICU.  That night CT of the abdomen and pelvis showed a pericardial effusion, urgent bedside echocardiogram showed tamponade physiology.  He decompensated requiring intubation and CT surgery evaluated. -Intubated 8/12 -Cardiocentesis was not possible, so he underwent urgent pericardial window 8/12 -Extubated 8/14 -8/15 pressors were stopped -8/15 antibiotics were stopped       Assessment & Plan:  Pericardial effusion S/p pericardial window 8/12 This seems to have been hemorrhagic (culture negative, path showed fibrinous pericarditis, no malignancy, op note described "large amount of bloody fluid" evacuated). Repeat  Echo 8/16 showed no recurrence of effusion.  Cardiology restarted warfarin 8/15. -Consult CT surgery -Consult cardiology, appreciate cares   ESRD on HD -Consult nephrology, appreciate cares  Diabetes -Low dose correction insulin, adjust to give with meals -Hold home glargine 15 units nightly  Hypertension Vascular disease secondary prevention He has been hypotensive.  Unclear why he is not on a statin given his history of atherosclerosis and amputation of the foot. -Hold home BiDil, metoprolol -Continue midodrine -Start atorvastatin  Moderate protein calorie malnutrition Yesterday unable to take PO.  Had had inadequate nutrition for >1 week.  Today, however, patient much more alert, discussed with nursing, ate 100%  lunch as well as supplements.   -Consult to dietitian, appreciate expert guidance -Will monitor PO intake -Low threshold to place [place]  Anemia Stable -Defer EPO to Nephrology  Acute metabolic encephalopathy This appears somewhat improved today. -Consult SLP  Paroxysmal atrial fibrillation -Continue warfarin    DVT prophylaxis: N?A on warfarin Code Status: Full code Family Communication: None present MDM and disposition Plan: The below labs and imaging reports were reviewed and summarized above.  Medication management as above.  The patient was admitted with pericardial tamponade, underwent pericardial window on 8/12, weaned from pressors 8/15 and extubated.  We are currently advancing his diet.  He is unable to tolerate full diet, and still needs 2 person assistance just for transfers in bed.  While we await safe discharge plan, we will continue to advance diet, transition back onto warfarin, and monitor hemoglobin and for signs of reaccumulation of pericardial effusion.      Consultants:   GI  General surgery  Nephrology  Cardiothoracic surgery  Cardiology  Critical care  Procedures:   8/12 intubation   8/12 central line placement, initiated pressors  8/12 echocardiogram LV EF: 20%  ------------------------------------------------------------------- Indications:      Tachycardia 785.0. STAT limited echo for tamponade.  ------------------------------------------------------------------- Study Conclusions  - HPI and indications: STAT limited echo for tamponade. - Left ventricle: Small LV cavity. Moderate to severe LVH. The   estimated ejection fraction was 20%. Diffuse hypokinesis. - Right ventricle: RV appears underfilled- diastolic collapse is   noted. - Inferior vena cava: The vessel was dilated. The respirophasic   diameter changes were blunted (< 50%), consistent with elevated   central venous pressure. - Pericardium, extracardiac: Large  pericardial  effusion - measuring   up to 2 cm at the apex. Features are highly suggestive of   tamponade physiology.  Urgent and Critical Findings:   A result from an emergently ordered study, cardiac tamponade, was reported to Dr. Jimmey Ralph , by Dr. Debara Pickett , on 2019 , at 07:10 AM. Impressions:  - Echo demonstrates large circumferential pericardial effusion   suggestive of tamponade physiology.  Recommendations:  Urgent pericardiocentesis is recommended.    8/12 subxiphoid pericardial window  8/15 extubation, discontinue pressors  8/16 echocardiogram LV EF: 30% -   35%  ------------------------------------------------------------------- Indications:      Pericardial effusion 423.9.  ------------------------------------------------------------------- History:   Risk factors:  End stage renal disease. Hypotension. Sepsis. Diabetes mellitus.  ------------------------------------------------------------------- Study Conclusions  - Left ventricle: The cavity size was normal. Wall thickness was   increased in a pattern of moderate LVH. Systolic function was   moderately to severely reduced. The estimated ejection fraction   was in the range of 30% to 35%. Diffuse hypokinesis with inferior   akinesis. The study was not technically sufficient to allow   evaluation of LV diastolic dysfunction due to atrial   fibrillation. - Aortic valve: There was no stenosis. - Mitral valve: There was no significant regurgitation. - Right ventricle: The cavity size was mildly dilated. Systolic   function was mildly to moderately reduced. - Tricuspid valve: Peak RV-RA gradient (S): 29 mm Hg. - Pulmonary arteries: PA peak pressure: 44 mm Hg (S). - Systemic veins: IVC measured 2.4 cm with < 50% respirophasic   variation, suggesting RA pressure 15 mmHg. - Pericardium, extracardiac: Pleural effusion noted. There was no   pericardial effusion.  Impressions:  - Technically difficult  study with poor acoustic windows. Normal LV   size with moderate LV hypertrophy. EF 30-35%, diffuse hypokinesis   with inferior akinesis. Mildly dilated RV with mild to moderately   decreased systolic function. Mild pulmonary hypertension. No   pericardial effusion.  Antimicrobials:    Vancomycin 8/11 >> 8/13  Zosyn 8/11 >>8/15   Culture data:   8/10 blood culture x1: NGTD  8/11 blood culture x2: NGTD       Subjective: Discussed with nursing.  Interactive today, more than yesterday.  No fever.  Still needs assistance for adjustment in bed, cannot sit up.  Paitent denies pain in chest, abdomen.  No dyspnea.  No cough, vomiting.  Oriented to place and time.  Objective: Vitals:   01/18/18 1100 01/18/18 1130 01/18/18 1202 01/18/18 1300  BP: (!) 128/42 (!) 107/46 (!) 107/54 (!) 103/54  Pulse: 76 75 73 60  Resp:   17 16  Temp:   (!) 97.5 F (36.4 C) 97.6 F (36.4 C)  TempSrc:   Oral Oral  SpO2:   99% 96%  Weight:   108.5 kg   Height:        Intake/Output Summary (Last 24 hours) at 01/18/2018 1648 Last data filed at 01/18/2018 1431 Gross per 24 hour  Intake 320 ml  Output 2195 ml  Net -1875 ml   Filed Weights   01/16/18 2013 01/18/18 0725 01/18/18 1202  Weight: 109.3 kg 111 kg 108.5 kg    Examination: General appearance: Adult male, lying in bed, interactive, appears very tired.  Weak. HEENT: Anicteric, conjunctival watery, lids and lashes normal.  No nasal deformity, discharge, or epistaxis.  Lips very dry, oropharynx incredibly dry, no oral lesions, hard of hearing.  Dentition poor. Skin: Warm and dry, no suspicious rashes or lesions. Cardiac: Regular  rate and rhythm, I think I do hear a friction rub, JVP normal, no lower extremity pitting edema. Respiratory: Respiratory effort shallow, lungs clear without rales or wheezes. Abdomen: Abdomen soft, no tenderness to palpation or guarding. Neuro: Awake but very sluggish.  Follows commands.  Strength 4-/5 in upper and  lower extremities bilaterally, symmetric.  Speech fluent. Psych: Attention improved.  Oriented to come in hospital, August.  Affect blunted.    Data Reviewed: I have personally reviewed following labs and imaging studies:  CBC: Recent Labs  Lab 01/12/18 0346 01/13/18 0351 01/14/18 0348 01/15/18 0702 01/18/18 0755  WBC 12.8* 15.3* 14.0* 18.4* 13.3*  HGB 9.0* 9.4* 8.6* 8.8* 9.7*  HCT 27.4* 30.1* 27.3* 27.3* 29.1*  MCV 88.4 93.8 93.8 91.6 89.0  PLT 219 193 164 191 160   Basic Metabolic Panel: Recent Labs  Lab 01/14/18 0348  01/15/18 0702 01/15/18 1725 01/16/18 1708 01/17/18 0443 01/17/18 1525 01/18/18 0755  NA 140   < > 141 138 140 140 138 138  K 4.0   < > 3.6 2.9* 4.6 4.5 5.2* 3.5  CL 102   < > 104 100 103 102 100 98  CO2 23   < > 24 26 22 23  21* 23  GLUCOSE 142*   < > 172* 197* 93 122* 158* 117*  BUN 32*   < > 54* 35* 51* 61* 68* 82*  CREATININE 3.75*   < > 5.82* 4.36* 6.40* 7.21* 7.67* 8.74*  CALCIUM 9.0   < > 9.6 8.9 9.1 9.2 9.2 9.1  MG 2.7*  --   --   --   --   --   --   --   PHOS  --    < > 3.6 2.6 3.5  --  4.8* 4.4   < > = values in this interval not displayed.   GFR: Estimated Creatinine Clearance: 11.4 mL/min (A) (by C-G formula based on SCr of 8.74 mg/dL (H)). Liver Function Tests: Recent Labs  Lab 01/12/18 0346  01/13/18 0351  01/14/18 0348  01/15/18 1725 01/16/18 1708 01/17/18 0443 01/17/18 1525 01/18/18 0755  AST 4,778*  --  1,788*  --  565*  --   --   --  89*  --   --   ALT 5,783*  --  4,017*  --  2,540*  --   --   --  840*  --   --   ALKPHOS 196*  --  208*  --  193*  --   --   --  182*  --   --   BILITOT 3.5*  --  3.9*  --  3.9*  --   --   --  3.2*  --   --   PROT 6.8  --  7.0  --  6.4*  --   --   --  5.8*  --   --   ALBUMIN 3.0*   < > 3.0*   < > 2.7*   < > 2.5* 2.4* 2.3* 2.3* 2.2*   < > = values in this interval not displayed.   Recent Labs  Lab 01/12/18 0346 01/13/18 0351  LIPASE 41 35   No results for input(s): AMMONIA in the last  168 hours. Coagulation Profile: Recent Labs  Lab 01/14/18 0348 01/15/18 0332 01/16/18 0349 01/17/18 0443 01/18/18 0755  INR 2.05 2.20 1.96 1.72 1.83   Cardiac Enzymes: No results for input(s): CKTOTAL, CKMB, CKMBINDEX, TROPONINI in the last 168 hours. BNP (last 3  results) No results for input(s): PROBNP in the last 8760 hours. HbA1C: No results for input(s): HGBA1C in the last 72 hours. CBG: Recent Labs  Lab 01/17/18 1624 01/17/18 1923 01/18/18 0018 01/18/18 0441 01/18/18 1253  GLUCAP 148* 157* 161* 125* 91   Lipid Profile: No results for input(s): CHOL, HDL, LDLCALC, TRIG, CHOLHDL, LDLDIRECT in the last 72 hours. Thyroid Function Tests: No results for input(s): TSH, T4TOTAL, FREET4, T3FREE, THYROIDAB in the last 72 hours. Anemia Panel: Recent Labs    01/18/18 0825  FERRITIN 4,025*  TIBC 175*  IRON 57   Urine analysis:    Component Value Date/Time   COLORURINE YELLOW 08/04/2013 [place] 08/04/2013 0249   LABSPEC 1.013 08/04/2013 0249   PHURINE 7.5 08/04/2013 0249   GLUCOSEU 500 (A) 08/04/2013 0249   HGBUR SMALL (A) 08/04/2013 0249   BILIRUBINUR NEGATIVE 08/04/2013 0249   KETONESUR NEGATIVE 08/04/2013 0249   PROTEINUR 100 (A) 08/04/2013 0249   UROBILINOGEN 0.2 08/04/2013 0249   NITRITE NEGATIVE 08/04/2013 0249   LEUKOCYTESUR NEGATIVE 08/04/2013 0249   Sepsis Labs: @LABRCNTIP (procalcitonin:4,lacticacidven:4)  ) Recent Results (from the past 240 hour(s))  Culture, blood (routine x 2)     Status: None   Collection Time: 01/09/18  1:00 AM  Result Value Ref Range Status   Specimen Description BLOOD CENTRAL LINE  Final   Special Requests   Final    BOTTLES DRAWN AEROBIC ONLY Blood Culture adequate volume   Culture   Final    NO GROWTH 5 DAYS Performed at [facility], [place] [address] [place], [place] [address]    Report Status 01/15/2018 FINAL  Final  Blood Culture (routine x 2)     Status: None   Collection Time: 01/09/18  4:16  PM  Result Value Ref Range Status   Specimen Description   Final    LEFT ANTECUBITAL BOTTLES DRAWN AEROBIC AND ANAEROBIC   Special Requests Blood Culture adequate volume  Final   Culture   Final    NO GROWTH 5 DAYS Performed at [facility], [address], [place], [place] [address]    Report Status 01/14/2018 FINAL  Final  MRSA PCR Screening     Status: None   Collection Time: 01/09/18  9:21 PM  Result Value Ref Range Status   MRSA by PCR NEGATIVE NEGATIVE Final    Comment:        The GeneXpert MRSA Assay (FDA approved for NASAL specimens only), is one component of a comprehensive MRSA colonization surveillance program. It is not intended to diagnose MRSA infection nor to guide or monitor treatment for MRSA infections. Performed at [facility], [place] [address] [place], [place] [address]   Culture, blood (routine x 2)     Status: None   Collection Time: 01/10/18  1:00 AM  Result Value Ref Range Status   Specimen Description BLOOD CENTRAL LINE  Final   Special Requests   Final    BOTTLES DRAWN AEROBIC ONLY Blood Culture adequate volume   Culture   Final    NO GROWTH 5 DAYS Performed at [facility], [address] [place], [place] [address]    Report Status 01/15/2018 FINAL  Final  Body fluid culture     Status: None   Collection Time: 01/10/18  9:28 AM  Result Value Ref Range Status   Specimen Description FLUID PERICARDIAL  Final   Special Requests C  Final   Gram Stain   Final  MODERATE WBC PRESENT, PREDOMINANTLY PMN NO ORGANISMS SEEN    Culture   Final    NO GROWTH 3 DAYS Performed at [facility] [address] [place], [place] [address]    Report Status 01/13/2018 FINAL  Final  Body fluid culture     Status: None   Collection Time: 01/10/18 10:15 AM  Result Value Ref Range Status   Specimen Description FLUID PERICARDIAL  Final   Special Requests B  Final   Gram Stain   Final    MODERATE WBC PRESENT, PREDOMINANTLY PMN NO ORGANISMS  SEEN    Culture   Final    NO GROWTH 3 DAYS Performed at [facility], [place] [address] [place], [place] [address]    Report Status 01/13/2018 FINAL  Final         Radiology Studies: No results found.      Scheduled Meds: . chlorhexidine  15 mL Mouth Rinse BID  . Chlorhexidine Gluconate Cloth  6 each Topical Q0600  . darbepoetin (ARANESP) injection - DIALYSIS  60 mcg Intravenous Q Tue-HD  . feeding supplement (ENSURE ENLIVE)  237 mL Oral TID BM  . feeding supplement (NEPRO CARB STEADY)  237 mL Oral BID BM  . insulin aspart  2-6 Units Subcutaneous Q4H  . mouth rinse  15 mL Mouth Rinse q12n4p  . midodrine  5 mg Oral TID WC  . multivitamin  1 tablet Oral QHS  . sodium chloride flush  10-40 mL Intracatheter Q12H  . warfarin  5 mg Oral ONCE-1800  . Warfarin - Pharmacist Dosing Inpatient   Does not apply q1800   Continuous Infusions: . sodium chloride Stopped (01/14/18 0624)  . ferric gluconate (FERRLECIT/NULECIT) IV Stopped (01/18/18 1337)     LOS: 9 days    Time spent: 25 minutes    Edwin Dada, MD [facility] 01/18/2018, 4:48 PM     Pager [phone] --- please page though AMION:  [email or web address] Password [email or web address] If 7PM-7AM, please contact night-coverage

## 2018-01-18 NOTE — Progress Notes (Addendum)
[facility] Progress Note   Dialysis Orders: TTS [place] 4.25 hr EDW 10 425/800 profile 4 3 K 2.5 Ca right upper AVF heparin 3000 hectorol 6 venofer 50 last mircera was 30 in May  Assessment/Plan: 1. S/p pericardial effusion/window  2. ESRD -TTS HD.  Apparently AVF functional problems earlier when hypotensive -required short term catheter - all central lines out - AVF functioning well last intervention 08/02/17 Dr.Lin 50-60% stenosis - K 3.5 on 4 K  [place] (K yesterday 5.2 showed some hemolysis). Still has some right LE edema makes me wonder if some central stenosis.- follow up as outpatient - 3. Anemia - hgb 9.7  - last tsat 37% in July - check Fe studies Tuesday pre dialysis and resume ESA/weekly Fe 4. Secondary hyperparathyroidism - off Hectorol for now -wasn't started this admission and no corrected Ca is elevated - 5. HTN/volume - midodrine for BP support - no antihypertensives. 6. Nutrition - alb 2.2, added prostat/nepro - adv diet as able - currently on FL- no intake recorded yesterday -  7. Shock - LFTs coming down, CRRT earlier in this admission 8.  Afib/ hx DVT - rate controlled 9. Coagulopathy - corrected/ coumadin per pharmacy 10. Deconditioning - for SNF placement   Myriam Jacobson, PA-C [place] 01/18/2018,9:48 AM  LOS: 9 days   Pt seen, examined and agree w A/P as above.  Kelly Splinter MD [facility] pager [phone]   01/18/2018, 3:02 PM    Subjective:   No c/o  Objective Vitals:   01/18/18 0800 01/18/18 0830 01/18/18 0900 01/18/18 0930  BP: (!) 115/37 (!) 106/58 (!) 113/55 (!) 113/55  Pulse: 66 70 70 70  Resp:      Temp:      TempSrc:      SpO2:      Weight:      Height:       Physical Exam General: NAD drowsy - weak Heart: irreg irreg Lungs: dim BS Abdomen:  + BS soft NT Extremities: SCDs no sig LE edema Dialysis Access:  Right upper AVF Qb 400 right arm generalized  swelling   Additional Objective Labs: Basic Metabolic Panel: Recent Labs  Lab 01/16/18 1708 01/17/18 0443 01/17/18 1525 01/18/18 0755  NA 140 140 138 138  K 4.6 4.5 5.2* 3.5  CL 103 102 100 98  CO2 22 23 21* 23  GLUCOSE 93 122* 158* 117*  BUN 51* 61* 68* 82*  CREATININE 6.40* 7.21* 7.67* 8.74*  CALCIUM 9.1 9.2 9.2 9.1  PHOS 3.5  --  4.8* 4.4   Liver Function Tests: Recent Labs  Lab 01/13/18 0351  01/14/18 0348  01/17/18 0443 01/17/18 1525 01/18/18 0755  AST 1,788*  --  565*  --  89*  --   --   ALT 4,017*  --  2,540*  --  840*  --   --   ALKPHOS 208*  --  193*  --  182*  --   --   BILITOT 3.9*  --  3.9*  --  3.2*  --   --   PROT 7.0  --  6.4*  --  5.8*  --   --   ALBUMIN 3.0*   < > 2.7*   < > 2.3* 2.3* 2.2*   < > = values in this interval not displayed.   Recent Labs  Lab 01/12/18 0346 01/13/18 0351  LIPASE 41 35   CBC: Recent Labs  Lab 01/12/18 0346 01/13/18  5397 01/14/18 0348 01/15/18 0702 01/18/18 0755  WBC 12.8* 15.3* 14.0* 18.4* 13.3*  HGB 9.0* 9.4* 8.6* 8.8* 9.7*  HCT 27.4* 30.1* 27.3* 27.3* 29.1*  MCV 88.4 93.8 93.8 91.6 89.0  PLT 219 193 164 191 159   Blood Culture    Component Value Date/Time   SDES FLUID PERICARDIAL 01/10/2018 1015   SPECREQUEST B 01/10/2018 1015   CULT  01/10/2018 1015    NO GROWTH 3 DAYS Performed at [facility] [address] [place], [place] [address]    REPTSTATUS 01/13/2018 FINAL 01/10/2018 1015    Cardiac Enzymes: No results for input(s): CKTOTAL, CKMB, CKMBINDEX, TROPONINI in the last 168 hours. CBG: Recent Labs  Lab 01/17/18 1150 01/17/18 1624 01/17/18 1923 01/18/18 0018 01/18/18 0441  GLUCAP 119* 148* 157* 161* 125*   Iron Studies: No results for input(s): IRON, TIBC, TRANSFERRIN, FERRITIN in the last 72 hours. Lab Results  Component Value Date   INR 1.83 01/18/2018   INR 1.72 01/17/2018   INR 1.96 01/16/2018   Studies/Results: No results found. Medications: . sodium chloride  Stopped (01/14/18 0624)  . sodium chloride    . sodium chloride    . feeding supplement (JEVITY 1.2 CAL)    . ferric gluconate (FERRLECIT/NULECIT) IV     . chlorhexidine  15 mL Mouth Rinse BID  . Chlorhexidine Gluconate Cloth  6 each Topical Q0600  . darbepoetin (ARANESP) injection - DIALYSIS  60 mcg Intravenous Q Tue-HD  . feeding supplement (ENSURE ENLIVE)  237 mL Oral TID BM  . feeding supplement (NEPRO CARB STEADY)  237 mL Oral BID BM  . feeding supplement (PRO-STAT SUGAR FREE 64)  30 mL Oral BID  . insulin aspart  2-6 Units Subcutaneous Q4H  . mouth rinse  15 mL Mouth Rinse q12n4p  . midodrine  5 mg Oral TID WC  . multivitamin  1 tablet Oral QHS  . sodium chloride flush  10-40 mL Intracatheter Q12H  . Warfarin - Pharmacist Dosing Inpatient   Does not apply [phone]

## 2018-01-18 NOTE — Progress Notes (Signed)
Progress Note  Patient Name: [name] Date of Encounter: 01/18/2018  Primary Cardiologist:  Curt Bears  Subjective   67 year old gentleman with end-stage renal disease.  He has chronic systolic congestive heart failure.  He was admitted with hypotension and rapid atrial fibrillation.  Was found to have pericardial tamponade.  He is now status post pericardial window.  He was examined in the dialysis unit.    Inpatient Medications    Scheduled Meds: . chlorhexidine  15 mL Mouth Rinse BID  . Chlorhexidine Gluconate Cloth  6 each Topical Q0600  . darbepoetin (ARANESP) injection - DIALYSIS  60 mcg Intravenous Q Tue-HD  . feeding supplement (ENSURE ENLIVE)  237 mL Oral TID BM  . feeding supplement (NEPRO CARB STEADY)  237 mL Oral BID BM  . feeding supplement (PRO-STAT SUGAR FREE 64)  30 mL Oral BID  . insulin aspart  2-6 Units Subcutaneous Q4H  . mouth rinse  15 mL Mouth Rinse q12n4p  . midodrine  5 mg Oral TID WC  . multivitamin  1 tablet Oral QHS  . sodium chloride flush  10-40 mL Intracatheter Q12H  . warfarin  5 mg Oral ONCE-1800  . Warfarin - Pharmacist Dosing Inpatient   Does not apply q1800   Continuous Infusions: . sodium chloride Stopped (01/14/18 0624)  . sodium chloride    . sodium chloride    . feeding supplement (JEVITY 1.2 CAL)    . ferric gluconate (FERRLECIT/NULECIT) IV     PRN Meds: sodium chloride, sodium chloride, sodium chloride, alteplase, fentaNYL (SUBLIMAZE) injection, heparin, heparin, lidocaine (PF), lidocaine-prilocaine, LORazepam, ondansetron (ZOFRAN) IV, pentafluoroprop-tetrafluoroeth, sodium chloride flush   Vital Signs    Vitals:   01/18/18 0800 01/18/18 0830 01/18/18 0900 01/18/18 0930  BP: (!) 115/37 (!) 106/58 (!) 113/55 (!) 113/55  Pulse: 66 70 70 70  Resp:      Temp:      TempSrc:      SpO2:      Weight:      Height:        Intake/Output Summary (Last 24 hours) at 01/18/2018 1002 Last data filed at 01/17/2018 2201 Gross per 24  hour  Intake 0 ml  Output -  Net 0 ml   Filed Weights   01/16/18 0500 01/16/18 2013 01/18/18 0725  Weight: 109.3 kg 109.3 kg 111 kg    Telemetry    Atrial fib with a controlled ventricular response- Personally Reviewed  ECG     - Personally Reviewed  Physical Exam   GEN:    Middle-aged gentleman.  He continues to be very sleepy and seems to have an altered mental status. Neck: No JVD Cardiac:  Irregularly irregular Respiratory: Clear to auscultation bilaterally. GI: Soft, nontender, non-distended  MS: No edema; No deformity. Neuro:  Nonfocal  Psych: Normal affect   Labs    Chemistry Recent Labs  Lab 01/13/18 0351  01/14/18 0348  01/17/18 0443 01/17/18 1525 01/18/18 0755  NA 137   < > 140   < > 140 138 138  K 4.1   < > 4.0   < > 4.5 5.2* 3.5  CL 100   < > 102   < > 102 100 98  CO2 23   < > 23   < > 23 21* 23  GLUCOSE 142*   < > 142*   < > 122* 158* 117*  BUN 21   < > 32*   < > 61* 68* 82*  CREATININE 2.80*   < >  3.75*   < > 7.21* 7.67* 8.74*  CALCIUM 8.4*   < > 9.0   < > 9.2 9.2 9.1  PROT 7.0  --  6.4*  --  5.8*  --   --   ALBUMIN 3.0*   < > 2.7*   < > 2.3* 2.3* 2.2*  AST 1,788*  --  565*  --  89*  --   --   ALT 4,017*  --  2,540*  --  840*  --   --   ALKPHOS 208*  --  193*  --  182*  --   --   BILITOT 3.9*  --  3.9*  --  3.2*  --   --   GFRNONAA 22*   < > 16*   < > 7* 7* 6*  GFRAA 26*   < > 18*   < > 8* 8* 7*  ANIONGAP 14   < > 15   < > 15 17* 17*   < > = values in this interval not displayed.     Hematology Recent Labs  Lab 01/14/18 0348 01/15/18 0702 01/18/18 0755  WBC 14.0* 18.4* 13.3*  RBC 2.91* 2.98* 3.27*  HGB 8.6* 8.8* 9.7*  HCT 27.3* 27.3* 29.1*  MCV 93.8 91.6 89.0  MCH 29.6 29.5 29.7  MCHC 31.5 32.2 33.3  RDW 16.7* 16.8* 19.3*  PLT 164 191 159    Cardiac EnzymesNo results for input(s): TROPONINI in the last 168 hours. No results for input(s): TROPIPOC in the last 168 hours.   BNPNo results for input(s): BNP, PROBNP in the last 168  hours.   DDimer No results for input(s): DDIMER in the last 168 hours.   Radiology    No results found.  Cardiac Studies     Patient Profile     67 y.o. male admitted with hypotension and found to have a large hemorrhagic effusion.  Status post pericardial window.  Assessment & Plan    1.  Pericardial tamponade: This appears to be stable.  Echocardiogram Do not show any evidence of recurrent pericardial effusion.  Anticoagulant and is currently been held.  We have held off on restarting Coumadin since it was a hemorrhagic pericardial effusion.   2.  Atrial fibrillation: Patient appears to be stable.  2.  Chronic systolic congestive heart failure: Ejection fraction is 30 to 35%. We have been limited in starting her medications because of hypotension and bradycardia.  Continue dialysis.      For questions or updates, please contact [place] Please consult [email or web address] for contact info under Cardiology/STEMI.      Signed, Mertie Moores, MD  01/18/2018, 10:02 AM

## 2018-01-19 DIAGNOSIS — Z452 Encounter for adjustment and management of vascular access device: Secondary | ICD-10-CM

## 2018-01-19 DIAGNOSIS — Z4659 Encounter for fitting and adjustment of other gastrointestinal appliance and device: Secondary | ICD-10-CM

## 2018-01-19 LAB — RENAL FUNCTION PANEL
Albumin: 2.1 g/dL — ABNORMAL LOW (ref 3.5–5.0)
Anion gap: 17 — ABNORMAL HIGH (ref 5–15)
BUN: 50 mg/dL — ABNORMAL HIGH (ref 8–23)
CO2: 23 mmol/L (ref 22–32)
Calcium: 8.7 mg/dL — ABNORMAL LOW (ref 8.9–10.3)
Chloride: 94 mmol/L — ABNORMAL LOW (ref 98–111)
Creatinine, Ser: 6.63 mg/dL — ABNORMAL HIGH (ref 0.61–1.24)
GFR calc Af Amer: 9 mL/min — ABNORMAL LOW (ref >60)
GFR calc non Af Amer: 8 mL/min — ABNORMAL LOW (ref >60)
Glucose, Bld: 390 mg/dL — ABNORMAL HIGH (ref 70–99)
Phosphorus: 2.4 mg/dL — ABNORMAL LOW (ref 2.5–4.6)
Potassium: 4.2 mmol/L (ref 3.5–5.1)
Sodium: 134 mmol/L — ABNORMAL LOW (ref 135–145)

## 2018-01-19 LAB — GLUCOSE, CAPILLARY
Glucose-Capillary: 178 mg/dL — ABNORMAL HIGH (ref 70–99)
Glucose-Capillary: 258 mg/dL — ABNORMAL HIGH (ref 70–99)
Glucose-Capillary: 278 mg/dL — ABNORMAL HIGH (ref 70–99)
Glucose-Capillary: 300 mg/dL — ABNORMAL HIGH (ref 70–99)
Glucose-Capillary: 300 mg/dL — ABNORMAL HIGH (ref 70–99)
Glucose-Capillary: 395 mg/dL — ABNORMAL HIGH (ref 70–99)

## 2018-01-19 LAB — PROTIME-INR
INR: 1.91
Prothrombin Time: 21.7 seconds — ABNORMAL HIGH (ref 11.4–15.2)

## 2018-01-19 MED ORDER — SODIUM CHLORIDE 0.9 % IV SOLN: 62.5000 mg | INTRAVENOUS | Status: DC

## 2018-01-19 MED ORDER — INSULIN ASPART 100 UNIT/ML ~~LOC~~ SOLN
0.0000 [IU] | Freq: Three times a day (TID) | SUBCUTANEOUS | Status: DC
  Administered 2018-01-19: 5 [IU] via SUBCUTANEOUS
  Administered 2018-01-19: 9 [IU] via SUBCUTANEOUS
  Administered 2018-01-20: 1 [IU] via SUBCUTANEOUS
  Administered 2018-01-20: 2 [IU] via SUBCUTANEOUS
  Administered 2018-01-21 (×2): 3 [IU] via SUBCUTANEOUS

## 2018-01-19 MED ORDER — INSULIN ASPART 100 UNIT/ML ~~LOC~~ SOLN: 0.0000 [IU] | Freq: Three times a day (TID) | SUBCUTANEOUS | Status: DC

## 2018-01-19 MED ORDER — INSULIN GLARGINE 100 UNIT/ML ~~LOC~~ SOLN
10.0000 [IU] | Freq: Every day | SUBCUTANEOUS | Status: DC
  Administered 2018-01-19 – 2018-01-20 (×2): 10 [IU] via SUBCUTANEOUS
  Filled 2018-01-19 (×3): qty 0.1

## 2018-01-19 MED ORDER — CHLORHEXIDINE GLUCONATE CLOTH 2 % EX PADS
6.0000 | MEDICATED_PAD | Freq: Every day | CUTANEOUS | Status: DC
  Administered 2018-01-19: 6 via TOPICAL

## 2018-01-19 MED ORDER — INSULIN ASPART 100 UNIT/ML ~~LOC~~ SOLN
5.0000 [IU] | Freq: Once | SUBCUTANEOUS | Status: AC
  Administered 2018-01-19: 5 [IU] via SUBCUTANEOUS

## 2018-01-19 NOTE — Progress Notes (Addendum)
PROGRESS NOTE    [name]  CZY:[id number] DOB: 08-20-1950 DOA: 01/09/2018 PCP: Burnard Bunting, MD      Brief Narrative:  [name] is a 67 y.o. M with ESRD on HD, HTN, DM, hx of NHL, PVD, hx of IVC, hx of pAF on warfarin, and CHF EF 20% who presented from the dialysis center with malaise, hypotension, found to have pericardial effusion with tamponade.  Initially presented with malaise and hypotension.  Work-up showed a left lower lobe infiltrate on chest x-ray, INR >10, lactate 10, he was started on vancomycin and Zosyn and admitted to the ICU.  That night CT of the abdomen and pelvis showed a pericardial effusion, urgent bedside echocardiogram showed tamponade physiology.  He decompensated requiring intubation and CT surgery evaluated. -Intubated 8/12 -Cardiocentesis was not possible, so he underwent urgent pericardial window 8/12 -Extubated 8/14 -8/15 pressors were stopped -8/15 antibiotics were stopped   Assessment & Plan:  Pericardial effusion -hemorrhagic pericardial effusion with tamponade -Cardiology and CTS consulted, S/p pericardial window 8/12 -culture negative, path showed fibrinous pericarditis, no malignancy, op note described "large amount of bloody fluid" evacuated). Repeat  Echo 8/16 showed no recurrence of effusion.   -discontinued warfarin -appreciate cardiology and CTS input, would need to be off anticoagulation for at least a few months  ESRD on HD -hemodialysis per renal  Diabetes -Low dose correction insulin, adjust to give with meals -resumed overdose Lantus  Hypertension Vascular disease secondary prevention -Hold home BiDil, metoprolol -Continue midodrine -Started atorvastatin  Moderate protein calorie malnutrition -intake is improving -Continue supplements -Advanced  To renal diet  Anemia Stable -Defer EPO to Nephrology  Acute metabolic encephalopathy -improving  Paroxysmal atrial fibrillation -stopped warfarin, see  above  Remote DVT -More than 15 years ago per patient -Stopped warfarin due to large hemorrhagic pericardial effusion    DVT prophylaxis:  SCDs Code Status: Full code Family Communication: None present MDM and disposition Plan: may need SNF, in 1-2days if stable overall  Consultants:   GI  General surgery  Nephrology  Cardiothoracic surgery  Cardiology  Critical care  Procedures:   8/12 intubation   8/12 central line placement, initiated pressors  8/12 echocardiogram LV EF: 20%  ------------------------------------------------------------------- Indications:      Tachycardia 785.0. STAT limited echo for tamponade.  ------------------------------------------------------------------- Study Conclusions  - HPI and indications: STAT limited echo for tamponade. - Left ventricle: Small LV cavity. Moderate to severe LVH. The   estimated ejection fraction was 20%. Diffuse hypokinesis. - Right ventricle: RV appears underfilled- diastolic collapse is   noted. - Inferior vena cava: The vessel was dilated. The respirophasic   diameter changes were blunted (< 50%), consistent with elevated   central venous pressure. - Pericardium, extracardiac: Large pericardial effusion - measuring   up to 2 cm at the apex. Features are highly suggestive of   tamponade physiology.  Urgent and Critical Findings:   A result from an emergently ordered study, cardiac tamponade, was reported to Dr. Jimmey Ralph , by Dr. Debara Pickett , on 2019 , at 07:10 AM. Impressions:  - Echo demonstrates large circumferential pericardial effusion   suggestive of tamponade physiology.  Recommendations:  Urgent pericardiocentesis is recommended.    8/12 subxiphoid pericardial window  8/15 extubation, discontinue pressors  8/16 echocardiogram LV EF: 30% -   35%  ------------------------------------------------------------------- Indications:      Pericardial effusion  423.9.  ------------------------------------------------------------------- History:   Risk factors:  End stage renal disease. Hypotension. Sepsis. Diabetes mellitus.  ------------------------------------------------------------------- Study Conclusions  -  Left ventricle: The cavity size was normal. Wall thickness was   increased in a pattern of moderate LVH. Systolic function was   moderately to severely reduced. The estimated ejection fraction   was in the range of 30% to 35%. Diffuse hypokinesis with inferior   akinesis. The study was not technically sufficient to allow   evaluation of LV diastolic dysfunction due to atrial   fibrillation. - Aortic valve: There was no stenosis. - Mitral valve: There was no significant regurgitation. - Right ventricle: The cavity size was mildly dilated. Systolic   function was mildly to moderately reduced. - Tricuspid valve: Peak RV-RA gradient (S): 29 mm Hg. - Pulmonary arteries: PA peak pressure: 44 mm Hg (S). - Systemic veins: IVC measured 2.4 cm with < 50% respirophasic   variation, suggesting RA pressure 15 mmHg. - Pericardium, extracardiac: Pleural effusion noted. There was no   pericardial effusion.  Impressions:  - Technically difficult study with poor acoustic windows. Normal LV   size with moderate LV hypertrophy. EF 30-35%, diffuse hypokinesis   with inferior akinesis. Mildly dilated RV with mild to moderately   decreased systolic function. Mild pulmonary hypertension. No   pericardial effusion.  Antimicrobials:    Vancomycin 8/11 >> 8/13  Zosyn 8/11 >>8/15   Culture data:   8/10 blood culture x1: NGTD  8/11 blood culture x2: NGTD       Subjective: -feels better, appetite improving  Objective: Vitals:   01/18/18 1700 01/18/18 2001 01/19/18 0427 01/19/18 0742  BP: 117/65 (!) 93/56 108/63 (!) 107/52  Pulse: 84 86 92 96  Resp: 16 18 20 16   Temp: 97.6 F (36.4 C) (!) 97.5 F (36.4 C) 98.4 F (36.9 C)  99.1 F (37.3 C)  TempSrc: Oral Oral Oral Oral  SpO2: 96% 96% 94% 96%  Weight:      Height:        Intake/Output Summary (Last 24 hours) at 01/19/2018 1422 Last data filed at 01/19/2018 1000 Gross per 24 hour  Intake 460 ml  Output 1 ml  Net 459 ml   Filed Weights   01/16/18 2013 01/18/18 0725 01/18/18 1202  Weight: 109.3 kg 111 kg 108.5 kg    Examination: Gen: Awake, Alert, Oriented X 3, no distress, chronically ill-appearing HEENT: PERRLA, Neck supple, no JVD Lungs: Good air movement bilaterally, CTAB CVS: RRR,No Gallops,Rubs or new Murmurs Abd: soft, Non tender, non distended, BS present Extremities: No Cyanosis, Clubbing or edema Skin: no new rashes Psych: flat affect    Data Reviewed: I have personally reviewed following labs and imaging studies:  CBC: Recent Labs  Lab 01/13/18 0351 01/14/18 0348 01/15/18 0702 01/18/18 0755  WBC 15.3* 14.0* 18.4* 13.3*  HGB 9.4* 8.6* 8.8* 9.7*  HCT 30.1* 27.3* 27.3* 29.1*  MCV 93.8 93.8 91.6 89.0  PLT 193 164 191 756   Basic Metabolic Panel: Recent Labs  Lab 01/14/18 0348  01/15/18 1725 01/16/18 1708 01/17/18 0443 01/17/18 1525 01/18/18 0755 01/18/18 1940  NA 140   < > 138 140 140 138 138 140  K 4.0   < > 2.9* 4.6 4.5 5.2* 3.5 4.9  CL 102   < > 100 103 102 100 98 100  CO2 23   < > 26 22 23  21* 23 24  GLUCOSE 142*   < > 197* 93 122* 158* 117* 222*  BUN 32*   < > 35* 51* 61* 68* 82* 29*  CREATININE 3.75*   < > 4.36* 6.40* 7.21* 7.67* 8.74*  4.75*  CALCIUM 9.0   < > 8.9 9.1 9.2 9.2 9.1 8.3*  MG 2.7*  --   --   --   --   --   --   --   PHOS  --    < > 2.6 3.5  --  4.8* 4.4 2.6   < > = values in this interval not displayed.   GFR: Estimated Creatinine Clearance: 20.9 mL/min (A) (by C-G formula based on SCr of 4.75 mg/dL (H)). Liver Function Tests: Recent Labs  Lab 01/13/18 0351  01/14/18 0348  01/16/18 1708 01/17/18 0443 01/17/18 1525 01/18/18 0755 01/18/18 1940  AST 1,788*  --  565*  --   --  89*  --   --    --   ALT 4,017*  --  2,540*  --   --  840*  --   --   --   ALKPHOS 208*  --  193*  --   --  182*  --   --   --   BILITOT 3.9*  --  3.9*  --   --  3.2*  --   --   --   PROT 7.0  --  6.4*  --   --  5.8*  --   --   --   ALBUMIN 3.0*   < > 2.7*   < > 2.4* 2.3* 2.3* 2.2* 1.9*   < > = values in this interval not displayed.   Recent Labs  Lab 01/13/18 0351  LIPASE 35   No results for input(s): AMMONIA in the last 168 hours. Coagulation Profile: Recent Labs  Lab 01/15/18 0332 01/16/18 0349 01/17/18 0443 01/18/18 0755 01/19/18 0348  INR 2.20 1.96 1.72 1.83 1.91   Cardiac Enzymes: No results for input(s): CKTOTAL, CKMB, CKMBINDEX, TROPONINI in the last 168 hours. BNP (last 3 results) No results for input(s): PROBNP in the last 8760 hours. HbA1C: No results for input(s): HGBA1C in the last 72 hours. CBG: Recent Labs  Lab 01/18/18 2004 01/19/18 0038 01/19/18 0455 01/19/18 0742 01/19/18 1130  GLUCAP 254* 300* 258* 178* 300*   Lipid Profile: No results for input(s): CHOL, HDL, LDLCALC, TRIG, CHOLHDL, LDLDIRECT in the last 72 hours. Thyroid Function Tests: No results for input(s): TSH, T4TOTAL, FREET4, T3FREE, THYROIDAB in the last 72 hours. Anemia Panel: Recent Labs    01/18/18 0825  FERRITIN 4,025*  TIBC 175*  IRON 57   Urine analysis:    Component Value Date/Time   COLORURINE YELLOW 08/04/2013 [place] 08/04/2013 0249   LABSPEC 1.013 08/04/2013 0249   PHURINE 7.5 08/04/2013 0249   GLUCOSEU 500 (A) 08/04/2013 0249   HGBUR SMALL (A) 08/04/2013 0249   BILIRUBINUR NEGATIVE 08/04/2013 0249   KETONESUR NEGATIVE 08/04/2013 0249   PROTEINUR 100 (A) 08/04/2013 0249   UROBILINOGEN 0.2 08/04/2013 0249   NITRITE NEGATIVE 08/04/2013 0249   LEUKOCYTESUR NEGATIVE 08/04/2013 0249   Sepsis Labs: @LABRCNTIP (procalcitonin:4,lacticacidven:4)  ) Recent Results (from the past 240 hour(s))  Blood Culture (routine x 2)     Status: None   Collection Time:  01/09/18  4:16 PM  Result Value Ref Range Status   Specimen Description   Final    LEFT ANTECUBITAL BOTTLES DRAWN AEROBIC AND ANAEROBIC   Special Requests Blood Culture adequate volume  Final   Culture   Final    NO GROWTH 5 DAYS Performed at [facility], [address] [place], [place] [address]    Report Status 01/14/2018 FINAL  Final  MRSA PCR Screening     Status: None   Collection Time: 01/09/18  9:21 PM  Result Value Ref Range Status   MRSA by PCR NEGATIVE NEGATIVE Final    Comment:        The GeneXpert MRSA Assay (FDA approved for NASAL specimens only), is one component of a comprehensive MRSA colonization surveillance program. It is not intended to diagnose MRSA infection nor to guide or monitor treatment for MRSA infections. Performed at [facility], [place] [address] [place], [place] [address]   Culture, blood (routine x 2)     Status: None   Collection Time: 01/10/18  1:00 AM  Result Value Ref Range Status   Specimen Description BLOOD CENTRAL LINE  Final   Special Requests   Final    BOTTLES DRAWN AEROBIC ONLY Blood Culture adequate volume   Culture   Final    NO GROWTH 5 DAYS Performed at [facility], [address] [place], [place] [address]    Report Status 01/15/2018 FINAL  Final  Body fluid culture     Status: None   Collection Time: 01/10/18  9:28 AM  Result Value Ref Range Status   Specimen Description FLUID PERICARDIAL  Final   Special Requests C  Final   Gram Stain   Final    MODERATE WBC PRESENT, PREDOMINANTLY PMN NO ORGANISMS SEEN    Culture   Final    NO GROWTH 3 DAYS Performed at [facility], [place] [address], [place], [place] [address]    Report Status 01/13/2018 FINAL  Final  Body fluid culture     Status: None   Collection Time: 01/10/18 10:15 AM  Result Value Ref Range Status   Specimen Description FLUID PERICARDIAL  Final   Special Requests B  Final   Gram Stain   Final    MODERATE WBC PRESENT, PREDOMINANTLY  PMN NO ORGANISMS SEEN    Culture   Final    NO GROWTH 3 DAYS Performed at [facility], [place] [address], [place], [place] [address]    Report Status 01/13/2018 FINAL  Final         Radiology Studies: No results found.      Scheduled Meds: . atorvastatin  40 mg Oral q1800  . chlorhexidine  15 mL Mouth Rinse BID  . Chlorhexidine Gluconate Cloth  6 each Topical Q0600  . Chlorhexidine Gluconate Cloth  6 each Topical Q0600  . darbepoetin (ARANESP) injection - DIALYSIS  60 mcg Intravenous Q Tue-HD  . feeding supplement (ENSURE ENLIVE)  237 mL Oral TID BM  . feeding supplement (NEPRO CARB STEADY)  237 mL Oral BID BM  . insulin aspart  0-9 Units Subcutaneous TID WC  . mouth rinse  15 mL Mouth Rinse q12n4p  . midodrine  5 mg Oral TID WC  . multivitamin  1 tablet Oral QHS  . sodium chloride flush  10-40 mL Intracatheter Q12H   Continuous Infusions: . sodium chloride Stopped (01/14/18 0624)  . ferric gluconate (FERRLECIT/NULECIT) IV Stopped (01/18/18 1337)     LOS: 10 days    Time spent: 25 minutes    Domenic Polite, MD [facility] 01/19/2018, 2:22 PM     - please page though AMION:  [email or web address] Password [email or web address] If 7PM-7AM, please contact night-coverage

## 2018-01-19 NOTE — Progress Notes (Signed)
[name] Progress Note   Dialysis Orders: TTS [facility]  4h 36min   110kg   425/800   p4  3K/2.5 bath  RUA AVF  Hep 3000 (no heparin x 68month thru 9/12)  - hectorol 6 ug  - mircera 30ug in May  - venofer 50 /wk   home meds:  - insulin glargine 15u hs/ insulin aspart 10u tid ac  - isosorbide-hydralazine 20- 37.5 tid/ metoprolol succinate 25 mg qd  - warfarin as directed  - sevelamer 1600 tid/ mvi/ vitamins/ prns    Assessment/Plan: 1. S/p pericardial effusion/window - 01/10/18 by Dr Roxy Manns 2. Deconditioning - for SNF placement 3. ESRD -TTS HD. Back on warfarin, will hold heparin for a month from surgery thru 02/10/18. AVF problems when hypotensive, now resolved. Last AVF intervention 08/02/17 Dr.Lin 50-60% stenosis. Still has some right arm edema, concern for some central stenosis.- follow up as outpatient 4. Anemia - hgb 9's last tsat 37% in July. Tsat here is OK at 33%. resume ESA/weekly Fe 5. Secondary hyperparathyroidism - off Hectorol for now -wasn't started this admission and no corrected Ca is elevated - 6. Hypotension/volume - on midodrine for BP support. Home Bidil/ metoprolol are on hold. Under dry wt 1-2kg.  Will let volume come up, should be able to get off of midodrine   7. Nutrition - alb 2.2, added prostat/nepro - adv diet as able - currently on FL- no intake recorded yesterday -  8. Shock - cardiogenic due to #1. LFTs coming down, CRRT earlier 9. Afib/ hx DVT - rate controlled 10. Coagulopathy - corrected/ coumadin per pharmacy    Kelly Splinter MD [name] pager [phone]   01/19/2018, 10:26 AM    Subjective:   No c/o today  Objective Vitals:   01/18/18 1700 01/18/18 2001 01/19/18 0427 01/19/18 0742  BP: 117/65 (!) 93/56 108/63 (!) 107/52  Pulse: 84 86 92 96  Resp: 16 18 20 16   Temp: 97.6 F (36.4 C) (!) 97.5 F (36.4 C) 98.4 F (36.9 C) 99.1 F (37.3 C)  TempSrc: Oral Oral Oral Oral  SpO2: 96% 96% 94% 96%  Weight:       Height:       Physical Exam General: NAD drowsy - weak Heart: irreg irreg Lungs: dim BS Abdomen:  + BS soft NT Extremities: SCDs no sig LE edema Dialysis Access:  Right upper AVF Qb 400 right arm generalized swelling   Additional Objective Labs: Basic Metabolic Panel: Recent Labs  Lab 01/17/18 1525 01/18/18 0755 01/18/18 1940  NA 138 138 140  K 5.2* 3.5 4.9  CL 100 98 100  CO2 21* 23 24  GLUCOSE 158* 117* 222*  BUN 68* 82* 29*  CREATININE 7.67* 8.74* 4.75*  CALCIUM 9.2 9.1 8.3*  PHOS 4.8* 4.4 2.6   Liver Function Tests: Recent Labs  Lab 01/13/18 0351  01/14/18 0348  01/17/18 0443 01/17/18 1525 01/18/18 0755 01/18/18 1940  AST 1,788*  --  565*  --  89*  --   --   --   ALT 4,017*  --  2,540*  --  840*  --   --   --   ALKPHOS 208*  --  193*  --  182*  --   --   --   BILITOT 3.9*  --  3.9*  --  3.2*  --   --   --   PROT 7.0  --  6.4*  --  5.8*  --   --   --  ALBUMIN 3.0*   < > 2.7*   < > 2.3* 2.3* 2.2* 1.9*   < > = values in this interval not displayed.   Recent Labs  Lab 01/13/18 0351  LIPASE 35   CBC: Recent Labs  Lab 01/13/18 0351 01/14/18 0348 01/15/18 0702 01/18/18 0755  WBC 15.3* 14.0* 18.4* 13.3*  HGB 9.4* 8.6* 8.8* 9.7*  HCT 30.1* 27.3* 27.3* 29.1*  MCV 93.8 93.8 91.6 89.0  PLT 193 164 191 159   Blood Culture    Component Value Date/Time   SDES FLUID PERICARDIAL 01/10/2018 1015   SPECREQUEST B 01/10/2018 1015   CULT  01/10/2018 1015    NO GROWTH 3 DAYS Performed at [facility], [place] [address] [place],  [address]    REPTSTATUS 01/13/2018 FINAL 01/10/2018 1015    Cardiac Enzymes: No results for input(s): CKTOTAL, CKMB, CKMBINDEX, TROPONINI in the last 168 hours. CBG: Recent Labs  Lab 01/18/18 1658 01/18/18 2004 01/19/18 0038 01/19/18 0455 01/19/18 0742  GLUCAP 286* 254* 300* 258* 178*   Iron Studies:  Recent Labs    01/18/18 0825  IRON 57  TIBC 175*  FERRITIN 4,025*   Lab Results  Component Value Date    INR 1.91 01/19/2018   INR 1.83 01/18/2018   INR 1.72 01/17/2018   Studies/Results: No results found. Medications: . sodium chloride Stopped (01/14/18 0624)  . ferric gluconate (FERRLECIT/NULECIT) IV Stopped (01/18/18 1337)   . atorvastatin  40 mg Oral q1800  . chlorhexidine  15 mL Mouth Rinse BID  . Chlorhexidine Gluconate Cloth  6 each Topical Q0600  . darbepoetin (ARANESP) injection - DIALYSIS  60 mcg Intravenous Q Tue-HD  . feeding supplement (ENSURE ENLIVE)  237 mL Oral TID BM  . feeding supplement (NEPRO CARB STEADY)  237 mL Oral BID BM  . insulin aspart  2-6 Units Subcutaneous Q4H  . mouth rinse  15 mL Mouth Rinse q12n4p  . midodrine  5 mg Oral TID WC  . multivitamin  1 tablet Oral QHS  . sodium chloride flush  10-40 mL Intracatheter Q12H  . Warfarin - Pharmacist Dosing Inpatient   Does not apply [phone]

## 2018-01-19 NOTE — Progress Notes (Signed)
Physical Therapy Treatment Patient Details Name: [name] MRN: [id number] DOB: February 24, 1951 Today's Date: 01/19/2018    History of Present Illness Pt is a 67 y/o male admitted secondary to shock, found to have a uremic pericardial effusion and tamponade, s/p pericardial window, now with ongoing hypotension and known systolic CHF - EF 09%. Pt also with acute hypoxic respiratory failure with LLL PNA. PMH including but not limited to CKD, HTN and DM.    PT Comments    Continuing work on functional mobility and activity tolerance;  Exhausted post spending at least an hour OOB in the chair and had difficulty with rise to stand with +2 assist; Performed basic pivot transfer bed to chair with knees blocked for stability and 2 person max assist;   Reinforced to pt that today's activity OOB was a success even though he was so exhausted at the time of return to bed; Worth considering going to HD in the recliner.   Follow Up Recommendations  SNF     Equipment Recommendations  None recommended by PT    Recommendations for Other Services       Precautions / Restrictions Precautions Precautions: Fall Restrictions Weight Bearing Restrictions: No    Mobility  Bed Mobility Overal bed mobility: Needs Assistance Bed Mobility: Rolling;Sidelying to Sit Rolling: Min assist Sidelying to sit: Mod assist   Sit to supine: Max assist;+2 for physical assistance   General bed mobility comments: Lots of cues to initiate and max physical assist to help LEs into bed and ease shoulders down to supine  Transfers Overall transfer level: Needs assistance Equipment used: 2 person hand held assist(bil support at gait belt) Transfers: Stand Pivot Transfers Sit to Stand: Max assist;+2 physical assistance Stand pivot transfers: +2 physical assistance;Max assist       General transfer comment: Fatigued after at least an hour OOB in the chair, with difficulty rising to stand from recliner ( seat had been  bolstered with blankets); Performed basic pivot transfer with bil knees blocked for stabiltiy  Ambulation/Gait Ambulation/Gait assistance: +2 physical assistance;Mod assist Gait Distance (Feet): (small pivot steps bed to chair) Assistive device: Rolling walker (2 wheeled) Gait Pattern/deviations: Shuffle     General Gait Details: Cues for technique and mod assist to support pt and move RW during turnign steps to chair   Stairs             Wheelchair Mobility    Modified Rankin (Stroke Patients Only)       Balance     Sitting balance-Leahy Scale: Fair       Standing balance-Leahy Scale: Zero                              Cognition Arousal/Alertness: Awake/alert(though appearing fatigued) Behavior During Therapy: WFL for tasks assessed/performed Overall Cognitive Status: No family/caregiver present to determine baseline cognitive functioning Area of Impairment: Problem solving                             Problem Solving: Slow processing General Comments: Much improved cognitive status compared to PT eval      Exercises      General Comments General comments (skin integrity, edema, etc.): Session conducted on Room Air, and O2 sats 98%; HR 100, BP 101/63 sitting EOB; pt asymptomatic for dizziness      Pertinent Vitals/Pain Pain Assessment: No/denies pain    Home Living  Prior Function            PT Goals (current goals can now be found in the care plan section) Acute Rehab PT Goals Patient Stated Goal: Did not state PT Goal Formulation: With patient Time For Goal Achievement: 01/29/18 Potential to Achieve Goals: Fair Progress towards PT goals: Not progressing toward goals - comment(very fatigued post spending time in recliner)    Frequency    Min 2X/week      PT Plan Current plan remains appropriate    Co-evaluation              AM-PAC PT "6 Clicks" Daily Activity  Outcome  Measure  Difficulty turning over in bed (including adjusting bedclothes, sheets and blankets)?: A Lot Difficulty moving from lying on back to sitting on the side of the bed? : Unable Difficulty sitting down on and standing up from a chair with arms (e.g., wheelchair, bedside commode, etc,.)?: Unable Help needed moving to and from a bed to chair (including a wheelchair)?: A Lot Help needed walking in hospital room?: A Lot Help needed climbing 3-5 steps with a railing? : Total 6 Click Score: 9    End of Session Equipment Utilized During Treatment: Gait belt Activity Tolerance: Patient tolerated treatment well Patient left: in bed;with call bell/phone within reach Nurse Communication: Mobility status PT Visit Diagnosis: Other abnormalities of gait and mobility (R26.89);Muscle weakness (generalized) (M62.81)     Time: 1210-1220 PT Time Calculation (min) (ACUTE ONLY): 10 min  Charges:  $Gait Training: 8-22 mins $Therapeutic Activity: 8-22 mins                     Roney Marion, PT  Acute Rehabilitation Services Pager [phone] Office [place] 01/19/2018, 2:01 PM

## 2018-01-19 NOTE — Progress Notes (Signed)
Progress Note  Patient Name: [name] Date of Encounter: 01/19/2018  Primary Cardiologist:  Camnitz  Subjective   67 year old gentleman with end-stage renal disease.  He has chronic systolic congestive heart failure.  He was admitted with hypotension and rapid atrial fibrillation.  Was found to have pericardial tamponade.  He is now status post pericardial window.         Inpatient Medications    Scheduled Meds: . atorvastatin  40 mg Oral q1800  . chlorhexidine  15 mL Mouth Rinse BID  . Chlorhexidine Gluconate Cloth  6 each Topical Q0600  . Chlorhexidine Gluconate Cloth  6 each Topical Q0600  . darbepoetin (ARANESP) injection - DIALYSIS  60 mcg Intravenous Q Tue-HD  . feeding supplement (ENSURE ENLIVE)  237 mL Oral TID BM  . feeding supplement (NEPRO CARB STEADY)  237 mL Oral BID BM  . insulin aspart  0-9 Units Subcutaneous TID WC  . insulin glargine  10 Units Subcutaneous QHS  . mouth rinse  15 mL Mouth Rinse q12n4p  . midodrine  5 mg Oral TID WC  . multivitamin  1 tablet Oral QHS  . sodium chloride flush  10-40 mL Intracatheter Q12H   Continuous Infusions: . sodium chloride Stopped (01/14/18 0624)  . ferric gluconate (FERRLECIT/NULECIT) IV Stopped (01/18/18 1337)   PRN Meds: sodium chloride, fentaNYL (SUBLIMAZE) injection, LORazepam, ondansetron (ZOFRAN) IV, sodium chloride flush   Vital Signs    Vitals:   01/18/18 2001 01/19/18 0427 01/19/18 0742 01/19/18 1545  BP: (!) 93/56 108/63 (!) 107/52 107/64  Pulse: 86 92 96 88  Resp: 18 20 16 14   Temp: (!) 97.5 F (36.4 C) 98.4 F (36.9 C) 99.1 F (37.3 C) 97.8 F (36.6 C)  TempSrc: Oral Oral Oral Oral  SpO2: 96% 94% 96% 93%  Weight:      Height:        Intake/Output Summary (Last 24 hours) at 01/19/2018 1829 Last data filed at 01/19/2018 1500 Gross per 24 hour  Intake 480 ml  Output 1 ml  Net 479 ml   Filed Weights   01/16/18 2013 01/18/18 0725 01/18/18 1202  Weight: 109.3 kg 111 kg 108.5 kg     Telemetry    Afib with controlled V response - HR of 85. - Personally Reviewed  ECG     - Personally Reviewed  Physical Exam   Physical Exam: Blood pressure 107/64, pulse 88, temperature 97.8 F (36.6 C), temperature source Oral, resp. rate 14, height 6\' 3"  (1.905 m), weight 108.5 kg, SpO2 93 %.  GEN:   Middle aged man.  Continues to be very somulent  HEENT: Normal NECK: No JVD; No carotid bruits LYMPHATICS: No lymphadenopathy CARDIAC:  irreg. Irreg.  RESPIRATORY:  Clear to auscultation without rales, wheezing or rhonchi  ABDOMEN: Soft, non-tender, non-distended MUSCULOSKELETAL:  No edema; No deformity  SKIN: Warm and dry NEUROLOGIC:  Alert and oriented x 3    Labs    Chemistry Recent Labs  Lab 01/13/18 0351  01/14/18 0348  01/17/18 0443  01/18/18 0755 01/18/18 1940 01/19/18 1606  NA 137   < > 140   < > 140   < > 138 140 134*  K 4.1   < > 4.0   < > 4.5   < > 3.5 4.9 4.2  CL 100   < > 102   < > 102   < > 98 100 94*  CO2 23   < > 23   < >  23   < > 23 24 23   GLUCOSE 142*   < > 142*   < > 122*   < > 117* 222* 390*  BUN 21   < > 32*   < > 61*   < > 82* 29* 50*  CREATININE 2.80*   < > 3.75*   < > 7.21*   < > 8.74* 4.75* 6.63*  CALCIUM 8.4*   < > 9.0   < > 9.2   < > 9.1 8.3* 8.7*  PROT 7.0  --  6.4*  --  5.8*  --   --   --   --   ALBUMIN 3.0*   < > 2.7*   < > 2.3*   < > 2.2* 1.9* 2.1*  AST 1,788*  --  565*  --  89*  --   --   --   --   ALT 4,017*  --  2,540*  --  840*  --   --   --   --   ALKPHOS 208*  --  193*  --  182*  --   --   --   --   BILITOT 3.9*  --  3.9*  --  3.2*  --   --   --   --   GFRNONAA 22*   < > 16*   < > 7*   < > 6* 12* 8*  GFRAA 26*   < > 18*   < > 8*   < > 7* 14* 9*  ANIONGAP 14   < > 15   < > 15   < > 17* 16* 17*   < > = values in this interval not displayed.     Hematology Recent Labs  Lab 01/14/18 0348 01/15/18 0702 01/18/18 0755  WBC 14.0* 18.4* 13.3*  RBC 2.91* 2.98* 3.27*  HGB 8.6* 8.8* 9.7*  HCT 27.3* 27.3* 29.1*  MCV 93.8  91.6 89.0  MCH 29.6 29.5 29.7  MCHC 31.5 32.2 33.3  RDW 16.7* 16.8* 19.3*  PLT 164 191 159    Cardiac EnzymesNo results for input(s): TROPONINI in the last 168 hours. No results for input(s): TROPIPOC in the last 168 hours.   BNPNo results for input(s): BNP, PROBNP in the last 168 hours.   DDimer No results for input(s): DDIMER in the last 168 hours.   Radiology    No results found.  Cardiac Studies     Patient Profile     67 y.o. male admitted with hypotension and found to have a large hemorrhagic effusion.  Status post pericardial window.  Assessment & Plan    1.  Pericardial tamponade: stable, s/p pericardial window.    2.  Atrial fibrillation: Patient appears to be stable.  3.  Chronic systolic congestive heart failure:  BP is marginal , unable to add additional CHF meds  EF has been persistently low for years     4.   Altered mental status:   Still very somulent. Unable to tell me anything about how he is feeling .  Unable to participate in the exam or answer any questions.   For questions or updates, please contact Altha Please consult [email or web address] for contact info under Cardiology/STEMI.      Signed, Mertie Moores, MD  01/19/2018, 6:29 PM

## 2018-01-19 NOTE — Progress Notes (Addendum)
Inpatient Diabetes Program Recommendations  AACE/ADA: New Consensus Statement on Inpatient Glycemic Control (2015)  Target Ranges:  Prepandial:   less than 140 mg/dL      Peak postprandial:   less than 180 mg/dL (1-2 hours)      Critically ill patients:  140 - 180 mg/dL   Lab Results  Component Value Date   GLUCAP 178 (H) 01/19/2018   HGBA1C 8.7 (H) 05/03/2015    Review of Glycemic Control  Diabetes history: DM 2 Outpatient Diabetes medications: Lantus 15 units qhs, Novolog 10 units tid Current orders for Inpatient glycemic control: Novolog 2-6 units Q4 hours  Inpatient Diabetes Program Recommendations:  Patient currently ordered ICU order set. Please d/c ICU Glycemic control order set as it is for NPO ventilated patients.   Please consider the regular glycemic control order set, Novolog 0-15 units tid + Novolog 0-5 units qhs while on the floor. Fasting glucose 178 mg/dl this am.   Noted patient takes meal coverage at home. If glucose increases after meals, patient may need low dose meal coverage tid if patient is consuming at least 50% of meals.  Addendum 1334 pm:  Glucose increased to 300 at lunchtime. Consider Novolog Moderate Correction 0-15 units tid + Novolog HS scale, Novolog 3 units tid meal coverage. Consider restarting Lantus 10 units from home dose.  Thanks,  Tama Headings RN, MSN, BC-ADM Inpatient Diabetes Coordinator Team Pager [phone] (8a-5p)

## 2018-01-19 NOTE — Progress Notes (Signed)
Nutrition Follow-up  DOCUMENTATION CODES:   Non-severe (moderate) malnutrition in context of chronic illness(May have element of acute malnutrition as well)  INTERVENTION:    Diet advancement as able per SLP. Recommend advance to solids, if able, to increase calorie and protein provision.  Continue Ensure TID and Nepro BID.   Continue renal multivitamin.  NUTRITION DIAGNOSIS:   Moderate Malnutrition related to chronic illness(ESRD on HD, CM EF 20%; exacerbated by acute illness) as evidenced by moderate muscle depletion, moderate fat depletion.  Ongoing  GOAL:   Patient will meet greater than or equal to 90% of their needs  Progressing  MONITOR:   PO intake, Supplement acceptance, Labs, Weight trends, TF tolerance  ASSESSMENT:   67 yo male admitted with septic and cardiogenic shock, acute respiratory failure requiring vent support, pericaridal effusion taking emergently to OR. Pt with hx of ESRD on HD, DM, HTN, NICM EF 20%, GERD, HLD, PVD  Patient remains much more alert today. He has been consuming 100% of his full liquid meals since lunch yesterday. He is also drinking Nepro and Ensure supplements. RN is encouraging intake and assisting patient with meals.   Cortrak will not be needed as long as good intake of meals and supplements continues. Patient states he will eat whatever he needs despite having a poor appetite.  Recommend advancing meals to solids if able pending SLP evaluation today. Solid food diet will provide more protein and calories than full liquids. If able to advance to solids, recommend no dietary restrictions.   Labs and medications reviewed. CBG's: [phone]   Diet Order:   Diet Order            Diet full liquid Room service appropriate? Yes; Fluid consistency: Thin  Diet effective now              EDUCATION NEEDS:   Not appropriate for education at this time  Skin:  Skin Assessment: Skin Integrity Issues: Skin Integrity Issues::  Incisions Incisions: chest  Last BM:  8/21 type 4  Height:   Ht Readings from Last 1 Encounters:  01/09/18 6\' 3"  (1.905 m)    Weight:   Wt Readings from Last 1 Encounters:  01/18/18 108.5 kg    Ideal Body Weight:  89 kg  BMI:  Body mass index is 29.9 kg/m.  Estimated Nutritional Needs:   Kcal:  2400-2600 kcals   Protein:  120-130 g  Fluid:  1000 mL plus UOP    Molli Barrows, RD, LDN, [place] Pager [phone] After Hours Pager [phone]

## 2018-01-19 NOTE — Progress Notes (Signed)
  Speech Language Pathology Treatment: Dysphagia  Patient Details Name: [name] MRN: [id number] DOB: Oct 25, 1950 Today's Date: 01/19/2018 Time: 7939-0300 SLP Time Calculation (min) (ACUTE ONLY): 30 min  Assessment / Plan / Recommendation Clinical Impression  Pt seen for safety and efficiency with upgraded texture from clear liquids. Alertness improved and pt able to sustain attention to task. He reports having upper denture plate but does not utilize with po's. He required moderate verbal cueing for smaller sips and bites. Large consecutive straws sips resulted in immediate cough remediated by straw removal and smaller sips consistently throughout remainder of session. Mildly prolonged mastication and transit with solid regular texture however functional without significant residue. Exhibited mildly increased work of breathing following episode of suspected airway intrusion. Recommend continue regular texture, thin liquids, NO straws, small sips, pills whole in puree, upright posture, awake/alert and continued ST.    HPI HPI: 67 year old man with ESRD, PVD, GERD, DM, with cardiogenic shock secondary to tamponade.  Now resolved post pericardial window.  Ongoing issues of delirium. Intubated 01/10/18-01/12/18. CXR 01/13/18 revealed persistent edema, persistent focal opacity in the left base with possible small associated effusion.      SLP Plan  Continue with current plan of care       Recommendations  Diet recommendations: Regular;Thin liquid Liquids provided via: Cup;No straw Medication Administration: Whole meds with puree Supervision: Patient able to self feed;Full supervision/cueing for compensatory strategies Compensations: Slow rate;Small sips/bites;Lingual sweep for clearance of pocketing Postural Changes and/or Swallow Maneuvers: Seated upright 90 degrees                Oral Care Recommendations: Oral care BID Follow up Recommendations: 24 hour supervision/assistance SLP  Visit Diagnosis: Dysphagia, oropharyngeal phase (R13.12) Plan: Continue with current plan of care       GO                Houston Siren 01/19/2018, 3:03 PM Orbie Pyo Colvin Caroli.Ed [employer] [phone]

## 2018-01-19 NOTE — Progress Notes (Signed)
Physical Therapy Treatment Patient Details Name: [name] MRN: [id number] DOB: 27-Sep-1950 Today's Date: 01/19/2018    History of Present Illness Pt is a 67 y/o male admitted secondary to shock, found to have a uremic pericardial effusion and tamponade, s/p pericardial window, now with ongoing hypotension and known systolic CHF - EF 24%. Pt also with acute hypoxic respiratory failure with LLL PNA. PMH including but not limited to CKD, HTN and DM.    PT Comments    Continuing work on functional mobility and activity tolerance, with notable significant improvements in cognition, ability to participate compared to previous session; Able to stand and take a few small steps to recliner with RW and mod assist; hopeful for good progress with mobility; agree with SNF for post-acute rehab to maximize independence and safety with mobility   Follow Up Recommendations  SNF     Equipment Recommendations  None recommended by PT    Recommendations for Other Services       Precautions / Restrictions Precautions Precautions: Fall Restrictions Weight Bearing Restrictions: No    Mobility  Bed Mobility Overal bed mobility: Needs Assistance Bed Mobility: Rolling;Sidelying to Sit Rolling: Min assist Sidelying to sit: Mod assist       General bed mobility comments: Min handheld assist to reach for rail to roll; light mod assist to elevate trunk to sit  Transfers Overall transfer level: Needs assistance Equipment used: Rolling walker (2 wheeled) Transfers: Sit to/from Stand Sit to Stand: Max assist;+2 physical assistance         General transfer comment: 2 person assist to sit to stand to RW; cues for hand placement; still weak, and not quite getting both knees fully extended in standing; Sat back to bed uncontrolled; then another rep of sit to stand  Ambulation/Gait Ambulation/Gait assistance: +2 physical assistance;Mod assist Gait Distance (Feet): (small pivot steps bed to  chair) Assistive device: Rolling walker (2 wheeled) Gait Pattern/deviations: Shuffle     General Gait Details: Cues for technique and mod assist to support pt and move RW during turnign steps to chair   Stairs             Wheelchair Mobility    Modified Rankin (Stroke Patients Only)       Balance     Sitting balance-Leahy Scale: Fair       Standing balance-Leahy Scale: Poor                              Cognition Arousal/Alertness: Awake/alert Behavior During Therapy: WFL for tasks assessed/performed Overall Cognitive Status: No family/caregiver present to determine baseline cognitive functioning Area of Impairment: Problem solving                             Problem Solving: Slow processing General Comments: Much improved cognitive status compared to PT eval      Exercises      General Comments General comments (skin integrity, edema, etc.): Session conducted on Room Air, and O2 sats 98%; HR 100, BP 101/63 sitting EOB; pt asymptomatic for dizziness      Pertinent Vitals/Pain Pain Assessment: No/denies pain    Home Living                      Prior Function            PT Goals (current goals can now be found  in the care plan section) Acute Rehab PT Goals Patient Stated Goal: Agreeable to OOB PT Goal Formulation: With patient Time For Goal Achievement: 01/29/18 Potential to Achieve Goals: Fair Progress towards PT goals: Progressing toward goals    Frequency    Min 2X/week      PT Plan Current plan remains appropriate    Co-evaluation              AM-PAC PT "6 Clicks" Daily Activity  Outcome Measure  Difficulty turning over in bed (including adjusting bedclothes, sheets and blankets)?: A Little Difficulty moving from lying on back to sitting on the side of the bed? : Unable Difficulty sitting down on and standing up from a chair with arms (e.g., wheelchair, bedside commode, etc,.)?: Unable Help  needed moving to and from a bed to chair (including a wheelchair)?: A Lot Help needed walking in hospital room?: A Lot Help needed climbing 3-5 steps with a railing? : A Lot 6 Click Score: 11    End of Session Equipment Utilized During Treatment: Gait belt Activity Tolerance: Patient tolerated treatment well Patient left: in chair;with call bell/phone within reach;with chair alarm set Nurse Communication: Mobility status PT Visit Diagnosis: Other abnormalities of gait and mobility (R26.89);Muscle weakness (generalized) (M62.81)     Time: 1015-1040 PT Time Calculation (min) (ACUTE ONLY): 25 min  Charges:  $Gait Training: 8-22 mins $Therapeutic Activity: 8-22 mins                     Roney Marion, PT  Acute Rehabilitation Services Pager [phone] Office [place] 01/19/2018, 12:06 PM

## 2018-01-19 NOTE — Progress Notes (Signed)
cbg 300- parameters refer to Phase 2 Adult ICU Glycemic Control  And Schorr,NP paged and orders given.

## 2018-01-20 ENCOUNTER — Encounter (HOSPITAL_COMMUNITY): Payer: Self-pay | Admitting: Nephrology

## 2018-01-20 LAB — CBC
HCT: 29 % — ABNORMAL LOW (ref 39.0–52.0)
Hemoglobin: 9.7 g/dL — ABNORMAL LOW (ref 13.0–17.0)
MCH: 29.7 pg (ref 26.0–34.0)
MCHC: 33.4 g/dL (ref 30.0–36.0)
MCV: 88.7 fL (ref 78.0–100.0)
Platelets: 161 10*3/uL (ref 150–400)
RBC: 3.27 MIL/uL — ABNORMAL LOW (ref 4.22–5.81)
RDW: 20.3 % — ABNORMAL HIGH (ref 11.5–15.5)
WBC: 18.6 10*3/uL — ABNORMAL HIGH (ref 4.0–10.5)

## 2018-01-20 LAB — RENAL FUNCTION PANEL
Albumin: 2.1 g/dL — ABNORMAL LOW (ref 3.5–5.0)
Anion gap: 8 (ref 5–15)
BUN: 22 mg/dL (ref 8–23)
CO2: 30 mmol/L (ref 22–32)
Calcium: 8.3 mg/dL — ABNORMAL LOW (ref 8.9–10.3)
Chloride: 96 mmol/L — ABNORMAL LOW (ref 98–111)
Creatinine, Ser: 3.88 mg/dL — ABNORMAL HIGH (ref 0.61–1.24)
GFR calc Af Amer: 17 mL/min — ABNORMAL LOW (ref >60)
GFR calc non Af Amer: 15 mL/min — ABNORMAL LOW (ref >60)
Glucose, Bld: 193 mg/dL — ABNORMAL HIGH (ref 70–99)
Phosphorus: 2.5 mg/dL (ref 2.5–4.6)
Potassium: 3.5 mmol/L (ref 3.5–5.1)
Sodium: 134 mmol/L — ABNORMAL LOW (ref 135–145)

## 2018-01-20 LAB — PROTIME-INR
INR: 1.92
Prothrombin Time: 21.8 seconds — ABNORMAL HIGH (ref 11.4–15.2)

## 2018-01-20 LAB — BASIC METABOLIC PANEL
Anion gap: 12 (ref 5–15)
BUN: 56 mg/dL — ABNORMAL HIGH (ref 8–23)
CO2: 28 mmol/L (ref 22–32)
Calcium: 8.8 mg/dL — ABNORMAL LOW (ref 8.9–10.3)
Chloride: 92 mmol/L — ABNORMAL LOW (ref 98–111)
Creatinine, Ser: 7.7 mg/dL — ABNORMAL HIGH (ref 0.61–1.24)
GFR calc Af Amer: 8 mL/min — ABNORMAL LOW (ref >60)
GFR calc non Af Amer: 7 mL/min — ABNORMAL LOW (ref >60)
Glucose, Bld: 230 mg/dL — ABNORMAL HIGH (ref 70–99)
Potassium: 4.1 mmol/L (ref 3.5–5.1)
Sodium: 132 mmol/L — ABNORMAL LOW (ref 135–145)

## 2018-01-20 LAB — GLUCOSE, CAPILLARY
Glucose-Capillary: 142 mg/dL — ABNORMAL HIGH (ref 70–99)
Glucose-Capillary: 149 mg/dL — ABNORMAL HIGH (ref 70–99)
Glucose-Capillary: 194 mg/dL — ABNORMAL HIGH (ref 70–99)

## 2018-01-20 MED ORDER — MIDODRINE HCL 5 MG PO TABS
ORAL_TABLET | ORAL | Status: AC
  Filled 2018-01-20: qty 1

## 2018-01-20 NOTE — Progress Notes (Signed)
  Speech Language Pathology Treatment: Dysphagia  Patient Details Name: [name] MRN: [id number] DOB: [birth year] Today's Date: 01/20/2018 Time: 6415-8309 SLP Time Calculation (min) (ACUTE ONLY): 14 min  Assessment / Plan / Recommendation Clinical Impression  Pt seen for swallow intervention following diet texture increase yesterday. Pt repositioned in more upright position and educated pt's cousins at bedside who agreed he was "a little to far back to be eating." Significantly prolonged mastication with [place] cut into smaller pieces by cousin. Required liquid wash to assist in transit. No cough, throat clear exhibited; questionable wet vocal quality x 1. Will reduce texture to Dys 3 with agreement from pt and family, upright position, no vocalizing after swallowing. ST will continue to treat.    HPI HPI: 67 year old man with ESRD, PVD, GERD, DM, with cardiogenic shock secondary to tamponade.  Now resolved post pericardial window.  Ongoing issues of delirium. Intubated 01/10/18-01/12/18. CXR 01/13/18 revealed persistent edema, persistent focal opacity in the left base with possible small associated effusion.      SLP Plan  Continue with current plan of care       Recommendations  Diet recommendations: Dysphagia 3 (mechanical soft);Thin liquid Liquids provided via: Cup;No straw Medication Administration: Whole meds with puree Supervision: Patient able to self feed;Full supervision/cueing for compensatory strategies Compensations: Slow rate;Small sips/bites;Lingual sweep for clearance of pocketing Postural Changes and/or Swallow Maneuvers: Seated upright 90 degrees                Oral Care Recommendations: Oral care BID Follow up Recommendations: 24 hour supervision/assistance SLP Visit Diagnosis: Dysphagia, oropharyngeal phase (R13.12) Plan: Continue with current plan of care       GO                Houston Siren 01/20/2018, 3:56 PM   Orbie Pyo Colvin Caroli.Ed  [employer] [phone]

## 2018-01-20 NOTE — Progress Notes (Signed)
[facility] Progress Note   Dialysis Orders: TTS [facility]  4h 66min   110kg   425/800   p4  3K/2.5 bath  RUA AVF  Hep 3000 (no heparin x 82month thru 9/12)  - hectorol 6 ug  - mircera 30ug in May  - venofer 50 /wk   home meds:  - insulin glargine 15u hs/ insulin aspart 10u tid ac  - isosorbide-hydralazine 20- 37.5 tid/ metoprolol succinate 25 mg qd  - warfarin as directed  - sevelamer 1600 tid/ mvi/ vitamins/ prns    Assessment/Plan: 1. S/p pericardial effusion/window - 01/10/18 by Dr Roxy Manns 2. Deconditioning - for SNF placement 3. ESRD -TTS HD. No heparin for a few months due to bloody pericardial effusion   4. HD access: had AVF problems when hypotensive, now resolved. Last AVF intervention 08/02/17 Dr.Lin 50-60% stenosis. Still has some right arm edema, concern for some central stenosis.- follow up as outpatient 5. Anemia - hgb 9's last tsat 37% in July. Tsat here is OK at 33%. resume ESA/weekly Fe 6. Secondary hyperparathyroidism - off Hectorol for now -wasn't started this admission and no corrected Ca is elevated - 7. Hypotension/volume - midodrine started here 5 tid. Had pulm edema ~ 8/14 at 112-114 kg, then dialyzed down to 107kg, now drifting back up. Hx low EF 20%, home Bidil/ metoprolol are on hold.  Will plan UF more on Sat.  8. Nutrition - alb 2.2, added prostat/nepro - adv diet as able - currently on FL- no intake recorded yesterday -  9. Shock - cardiogenic due to #1. ^LFTs resolving, shock liver 10. Afib/ hx DVT - rate controlled. Warfarin dc'd for "at least a few months" per primary due to #1    Kelly Splinter MD [place] pager [phone]   01/20/2018, 12:25 PM    Subjective:   No c/o today, confused on HD  Objective Vitals:   01/20/18 1045 01/20/18 1058 01/20/18 1130 01/20/18 1200  BP: (!) 112/54 (!) 113/55 (!) 91/50 (!) 93/44  Pulse: (!) 112 (!) 114 (!) 104 100  Resp: (!) 21 (!) 24 18 15   Temp:      TempSrc:      SpO2:  95%  95% 96%  Weight:      Height:       Physical Exam General: NAD drowsy - weak Heart: irreg irreg Lungs: dim BS Abdomen:  + BS soft NT Extremities: SCDs no sig LE edema Dialysis Access:  Right upper AVF Qb 400 right arm generalized swelling   Additional Objective Labs: Basic Metabolic Panel: Recent Labs  Lab 01/18/18 0755 01/18/18 1940 01/19/18 1606 01/20/18 0653  NA 138 140 134* 132*  K 3.5 4.9 4.2 4.1  CL 98 100 94* 92*  CO2 23 24 23 28   GLUCOSE 117* 222* 390* 230*  BUN 82* 29* 50* 56*  CREATININE 8.74* 4.75* 6.63* 7.70*  CALCIUM 9.1 8.3* 8.7* 8.8*  PHOS 4.4 2.6 2.4*  --    Liver Function Tests: Recent Labs  Lab 01/14/18 0348  01/17/18 0443  01/18/18 0755 01/18/18 1940 01/19/18 1606  AST 565*  --  89*  --   --   --   --   ALT 2,540*  --  840*  --   --   --   --   ALKPHOS 193*  --  182*  --   --   --   --   BILITOT 3.9*  --  3.2*  --   --   --   --  PROT 6.4*  --  5.8*  --   --   --   --   ALBUMIN 2.7*   < > 2.3*   < > 2.2* 1.9* 2.1*   < > = values in this interval not displayed.   No results for input(s): LIPASE, AMYLASE in the last 168 hours. CBC: Recent Labs  Lab 01/14/18 0348 01/15/18 0702 01/18/18 0755 01/20/18 0653  WBC 14.0* 18.4* 13.3* 18.6*  HGB 8.6* 8.8* 9.7* 9.7*  HCT 27.3* 27.3* 29.1* 29.0*  MCV 93.8 91.6 89.0 88.7  PLT 164 191 159 161   Blood Culture    Component Value Date/Time   SDES FLUID PERICARDIAL 01/10/2018 1015   SPECREQUEST B 01/10/2018 1015   CULT  01/10/2018 1015    NO GROWTH 3 DAYS Performed at [facility], [place] [address] [place], [place] [address]    REPTSTATUS 01/13/2018 FINAL 01/10/2018 1015    Cardiac Enzymes: No results for input(s): CKTOTAL, CKMB, CKMBINDEX, TROPONINI in the last 168 hours. CBG: Recent Labs  Lab 01/19/18 0742 01/19/18 1130 01/19/18 1616 01/19/18 2209 01/20/18 0957  GLUCAP 178* 300* 395* 278* 142*   Iron Studies:  Recent Labs    01/18/18 0825  IRON 57  TIBC 175*  FERRITIN  4,025*   Lab Results  Component Value Date   INR 1.92 01/20/2018   INR 1.91 01/19/2018   INR 1.83 01/18/2018   Studies/Results: No results found. Medications: . sodium chloride Stopped (01/14/18 0624)  . ferric gluconate (FERRLECIT/NULECIT) IV Stopped (01/18/18 1337)   . atorvastatin  40 mg Oral q1800  . chlorhexidine  15 mL Mouth Rinse BID  . Chlorhexidine Gluconate Cloth  6 each Topical Q0600  . Chlorhexidine Gluconate Cloth  6 each Topical Q0600  . darbepoetin (ARANESP) injection - DIALYSIS  60 mcg Intravenous Q Tue-HD  . feeding supplement (ENSURE ENLIVE)  237 mL Oral TID BM  . feeding supplement (NEPRO CARB STEADY)  237 mL Oral BID BM  . insulin aspart  0-9 Units Subcutaneous TID WC  . insulin glargine  10 Units Subcutaneous QHS  . mouth rinse  15 mL Mouth Rinse q12n4p  . midodrine  5 mg Oral TID WC  . multivitamin  1 tablet Oral QHS  . sodium chloride flush  10-40 mL Intracatheter Q12H

## 2018-01-20 NOTE — Clinical Social Work Note (Signed)
Clinical Social Work Assessment  Patient Details  Name: [name] MRN: [id number] Date of Birth: 02/09/1951  Date of referral:  01/20/18               Reason for consult:  Facility Placement, Discharge Planning                Permission sought to share information with:  Family Supports Permission granted to share information::  Yes, Verbal Permission Granted  Name::     [name] and [place]::     Relationship::  Son and Daughter-in-law  Sport and exercise psychologist Information:  [name] - [phone] and [name] [phone]  Housing/Transportation Living arrangements for the past 2 months:  Apartment Source of Information:  Patient Patient Interpreter Needed:  None Criminal Activity/Legal Involvement Pertinent to Current Situation/Hospitalization:  No - Comment as needed Significant Relationships:  Adult Children, Other Family Members Lives with:  Self Do you feel safe going back to the place where you live?  No(Patient in agreement with ST rehab prior to going home) Need for family participation in patient care:  Yes (Comment)  Care giving concerns:  Patient reported that he lives alone.   Social Worker assessment / plan:  CSW talked with patient at the bedside regarding discharge disposition and recommendation of ST rehab. [name] was napping when CSW entered room, but awakened easily when his name was called and was agreeable to talking with CSW. When asked, patient reported that he has been to [facility] this year and has been to 2 skilled nursing facilities before: [facility] and [facility]. CSW advised that he goes to dialysis at [facility] in [place], he arrives at the center at 5:30 am and his chair time is 5:50 am. He uses [facility] transportation for dialysis.  [name] reported that he does not have family that lives nearby, his family lives in [place] and [place] and he has one son, [name] (his wife his [name]). When asked, patient gave CSW permission to contact  his family (son and daughter-in-law). Patient agreeable to ST rehab and facility search process explained, and patient informed that he will given facility responses later today.   Employment status:  Retired Forensic scientist:  Medicare PT Recommendations:  [place] / Referral to community resources:  Skilled Nursing Facility(Patient provided with SNF list)  Patient/Family's Response to care:  [name] expressed no concerns regarding his care during hospitalization.  Patient/Family's Understanding of and Emotional Response to Diagnosis, Current Treatment, and Prognosis:  Patient's medical issues not discussed, however patient appeared to understand his need for ST rehab before returning home.  Emotional Assessment Appearance:  Appears stated age Attitude/Demeanor/Rapport:  Engaged Affect (typically observed):  Appropriate Orientation:  Oriented to Self, Oriented to Place, Oriented to  Time, Oriented to Situation Alcohol / Substance use:  Tobacco Use, Alcohol Use, Illicit Drugs(Patient reported that he has never smoked and does not drink or use illicit drugs) Psych involvement (Current and /or in the community):  No (Comment)  Discharge Needs  Concerns to be addressed:  Discharge Planning Concerns Readmission within the last 30 days:  No Current discharge risk:  Lives alone Barriers to Discharge:  Continued Medical Work up   [employer] Mila Homer, [place] 01/20/2018, 3:59 PM

## 2018-01-20 NOTE — Progress Notes (Signed)
Progress Note  Patient Name: [name] Date of Encounter: 01/20/2018  Primary Cardiologist:  [place]  Subjective   67 year old gentleman with end-stage renal disease.  He has chronic systolic congestive heart failure.  He was admitted with hypotension and rapid atrial fibrillation.  Was found to have pericardial tamponade.  He is now status post pericardial window.       Inpatient Medications    Scheduled Meds: . atorvastatin  40 mg Oral q1800  . chlorhexidine  15 mL Mouth Rinse BID  . Chlorhexidine Gluconate Cloth  6 each Topical Q0600  . Chlorhexidine Gluconate Cloth  6 each Topical Q0600  . darbepoetin (ARANESP) injection - DIALYSIS  60 mcg Intravenous Q Tue-HD  . feeding supplement (ENSURE ENLIVE)  237 mL Oral TID BM  . feeding supplement (NEPRO CARB STEADY)  237 mL Oral BID BM  . insulin aspart  0-9 Units Subcutaneous TID WC  . insulin glargine  10 Units Subcutaneous QHS  . mouth rinse  15 mL Mouth Rinse q12n4p  . [COMPLETED] midodrine      . midodrine  5 mg Oral TID WC  . multivitamin  1 tablet Oral QHS  . sodium chloride flush  10-40 mL Intracatheter Q12H   Continuous Infusions: . sodium chloride Stopped (01/14/18 0624)  . ferric gluconate (FERRLECIT/NULECIT) IV Stopped (01/18/18 1337)   PRN Meds: sodium chloride, ondansetron (ZOFRAN) IV, sodium chloride flush   Vital Signs    Vitals:   01/20/18 1015 01/20/18 1030 01/20/18 1045 01/20/18 1058  BP: (!) 95/56 (!) 105/49 (!) 112/54 (!) 113/55  Pulse: 99 (!) 104 (!) 112 (!) 114  Resp: 18 (!) 23 (!) 21 (!) 24  Temp:      TempSrc:      SpO2:  95%  95%  Weight:      Height:        Intake/Output Summary (Last 24 hours) at 01/20/2018 1120 Last data filed at 01/19/2018 2200 Gross per 24 hour  Intake 360 ml  Output -  Net 360 ml   Filed Weights   01/18/18 0725 01/18/18 1202 01/20/18 0745  Weight: 111 kg 108.5 kg 111.9 kg    Telemetry    Afib with controlled V response - HR of 85. - Personally  Reviewed  ECG     - Personally Reviewed  Physical Exam   Physical Exam: Blood pressure (!) 113/55, pulse (!) 114, temperature 98.5 F (36.9 C), temperature source Oral, resp. rate (!) 24, height 6\' 3"  (1.905 m), weight 111.9 kg, SpO2 95 %.  GEN:  Well nourished, well developed in no acute distress HEENT: Normal NECK: No JVD; No carotid bruits LYMPHATICS: No lymphadenopathy CARDIAC:   RESPIRATORY:  Clear to auscultation without rales, wheezing or rhonchi  ABDOMEN: Soft, non-tender, non-distended MUSCULOSKELETAL:  No edema; No deformity  SKIN: Warm and dry NEUROLOGIC:   somulent    Labs    Chemistry Recent Labs  Lab 01/14/18 0348  01/17/18 0443  01/18/18 0755 01/18/18 1940 01/19/18 1606 01/20/18 0653  NA 140   < > 140   < > 138 140 134* 132*  K 4.0   < > 4.5   < > 3.5 4.9 4.2 4.1  CL 102   < > 102   < > 98 100 94* 92*  CO2 23   < > 23   < > 23 24 23 28   GLUCOSE 142*   < > 122*   < > 117* 222* 390* 230*  BUN  32*   < > 61*   < > 82* 29* 50* 56*  CREATININE 3.75*   < > 7.21*   < > 8.74* 4.75* 6.63* 7.70*  CALCIUM 9.0   < > 9.2   < > 9.1 8.3* 8.7* 8.8*  PROT 6.4*  --  5.8*  --   --   --   --   --   ALBUMIN 2.7*   < > 2.3*   < > 2.2* 1.9* 2.1*  --   AST 565*  --  89*  --   --   --   --   --   ALT 2,540*  --  840*  --   --   --   --   --   ALKPHOS 193*  --  182*  --   --   --   --   --   BILITOT 3.9*  --  3.2*  --   --   --   --   --   GFRNONAA 16*   < > 7*   < > 6* 12* 8* 7*  GFRAA 18*   < > 8*   < > 7* 14* 9* 8*  ANIONGAP 15   < > 15   < > 17* 16* 17* 12   < > = values in this interval not displayed.     Hematology Recent Labs  Lab 01/15/18 0702 01/18/18 0755 01/20/18 0653  WBC 18.4* 13.3* 18.6*  RBC 2.98* 3.27* 3.27*  HGB 8.8* 9.7* 9.7*  HCT 27.3* 29.1* 29.0*  MCV 91.6 89.0 88.7  MCH 29.5 29.7 29.7  MCHC 32.2 33.3 33.4  RDW 16.8* 19.3* 20.3*  PLT 191 159 161    Cardiac EnzymesNo results for input(s): TROPONINI in the last 168 hours. No results for  input(s): TROPIPOC in the last 168 hours.   BNPNo results for input(s): BNP, PROBNP in the last 168 hours.   DDimer No results for input(s): DDIMER in the last 168 hours.   Radiology    No results found.  Cardiac Studies     Patient Profile     67 y.o. male admitted with hypotension and found to have a large hemorrhagic effusion.  Status post pericardial window.  Assessment & Plan    1.  Pericardial tamponade: stable, s/p pericardial window.    2.  Atrial fibrillation: HR remains on the fast side. We are limited with our rate controlling meds by his hypotension   3.  Chronic systolic congestive heart failure:  We are very limited being able to start standard heart failure medications.    4.   Altered mental status:   Still has altered mental status.  Is not able to participate in the exam.   For questions or updates, please contact [place] Please consult [email or web address] for contact info under Cardiology/STEMI.      Signed, Mertie Moores, MD  01/20/2018, 11:20 AM

## 2018-01-20 NOTE — Progress Notes (Signed)
PROGRESS NOTE    SCHUYLER OLDEN  AYT:[id number] DOB: Aug 22, 1950 DOA: 01/09/2018 PCP: Burnard Bunting, MD      Brief Narrative:  Mr. Uselman is a 67 y.o. M with ESRD on HD, HTN, DM, hx of NHL, PVD, hx of IVC, hx of pAF on warfarin, and CHF EF 20% who presented from the dialysis center with malaise, hypotension, found to have pericardial effusion with tamponade. - That night CT of the abdomen and pelvis showed a pericardial effusion, urgent bedside echocardiogram showed tamponade physiology.  He decompensated requiring intubation and CT surgery evaluated. -Intubated 8/12 -Cardiocentesis was not possible, so he underwent urgent pericardial window 8/12 -Extubated 8/14 -8/15 pressors were stopped  Assessment & Plan:  Pericardial effusion -hemorrhagic pericardial effusion with tamponade -Cardiology and CTS consulted, S/p pericardial window 8/12 -culture negative, path showed fibrinous pericarditis, no malignancy, op note described "large amount of bloody fluid" evacuated). Repeat  Echo 8/16 showed no recurrence of effusion.   -discontinued warfarin -appreciate cardiology and CTS input, anticoagulation discontinued, reevaluate in 4-6 weeks  ESRD on HD -hemodialysis per renal  Diabetes -resumed  Home dose Lantus, sliding-scale insulin  Hypertension Vascular disease secondary prevention -Hold home BiDil, metoprolol -Continue midodrine -Started atorvastatin  Moderate protein calorie malnutrition -intake is improving -Continue supplements, advanced to renal diet  Anemia Stable -Defer EPO to Nephrology  Acute metabolic encephalopathy -improving  Paroxysmal atrial fibrillation -stopped warfarin, see above  Remote DVT -More than 15 years ago per patient -Stopped warfarin due to large hemorrhagic pericardial effusion    DVT prophylaxis:  SCDs Code Status: Full code Family Communication: None present MDM and disposition Plan: SNF Friday or Saturday if  stable  Consultants:   GI  General surgery  Nephrology  Cardiothoracic surgery  Cardiology  Critical care  Procedures:   8/12 intubation   8/12 central line placement, initiated pressors  8/12 echocardiogram LV EF: 20%  ------------------------------------------------------------------- Indications:      Tachycardia 785.0. STAT limited echo for tamponade.  ------------------------------------------------------------------- Study Conclusions  - HPI and indications: STAT limited echo for tamponade. - Left ventricle: Small LV cavity. Moderate to severe LVH. The   estimated ejection fraction was 20%. Diffuse hypokinesis. - Right ventricle: RV appears underfilled- diastolic collapse is   noted. - Inferior vena cava: The vessel was dilated. The respirophasic   diameter changes were blunted (< 50%), consistent with elevated   central venous pressure. - Pericardium, extracardiac: Large pericardial effusion - measuring   up to 2 cm at the apex. Features are highly suggestive of   tamponade physiology.  Urgent and Critical Findings:   A result from an emergently ordered study, cardiac tamponade, was reported to Dr. Jimmey Ralph , by Dr. Debara Pickett , on 2019 , at 07:10 AM. Impressions:  - Echo demonstrates large circumferential pericardial effusion   suggestive of tamponade physiology.  Recommendations:  Urgent pericardiocentesis is recommended.    8/12 subxiphoid pericardial window  8/15 extubation, discontinue pressors  8/16 echocardiogram LV EF: 30% -   35%  ------------------------------------------------------------------- Indications:      Pericardial effusion 423.9.  ------------------------------------------------------------------- History:   Risk factors:  End stage renal disease. Hypotension. Sepsis. Diabetes mellitus.  ------------------------------------------------------------------- Study Conclusions  - Left ventricle: The cavity size was  normal. Wall thickness was   increased in a pattern of moderate LVH. Systolic function was   moderately to severely reduced. The estimated ejection fraction   was in the range of 30% to 35%. Diffuse hypokinesis with inferior   akinesis. The study was  not technically sufficient to allow   evaluation of LV diastolic dysfunction due to atrial   fibrillation. - Aortic valve: There was no stenosis. - Mitral valve: There was no significant regurgitation. - Right ventricle: The cavity size was mildly dilated. Systolic   function was mildly to moderately reduced. - Tricuspid valve: Peak RV-RA gradient (S): 29 mm Hg. - Pulmonary arteries: PA peak pressure: 44 mm Hg (S). - Systemic veins: IVC measured 2.4 cm with < 50% respirophasic   variation, suggesting RA pressure 15 mmHg. - Pericardium, extracardiac: Pleural effusion noted. There was no   pericardial effusion.  Impressions:  - Technically difficult study with poor acoustic windows. Normal LV   size with moderate LV hypertrophy. EF 30-35%, diffuse hypokinesis   with inferior akinesis. Mildly dilated RV with mild to moderately   decreased systolic function. Mild pulmonary hypertension. No   pericardial effusion.  Antimicrobials:    Vancomycin 8/11 >> 8/13  Zosyn 8/11 >>8/15   Culture data:   8/10 blood culture x1: NGTD  8/11 blood culture x2: NGTD       Subjective: -reports doing better, blood pressure low on hemodialysis -Appetite improving  Objective: Vitals:   01/20/18 1130 01/20/18 1200 01/20/18 1213 01/20/18 1255  BP: (!) 91/50 (!) 93/44 (!) 102/55 (!) 84/51  Pulse: (!) 104 100 89 100  Resp: 18 15 20 20   Temp:   98.2 F (36.8 C)   TempSrc:   Oral   SpO2: 95% 96% 95% (!) 87%  Weight:   111.9 kg   Height:        Intake/Output Summary (Last 24 hours) at 01/20/2018 1332 Last data filed at 01/20/2018 1213 Gross per 24 hour  Intake 360 ml  Output 0 ml  Net 360 ml   Filed Weights   01/18/18 1202 01/20/18  0745 01/20/18 1213  Weight: 108.5 kg 111.9 kg 111.9 kg    Examination:  Gen: Awake, Alert, Oriented X 3, chronically ill appearing, no distress HEENT: PERRLA, Neck supple, no JVD Lungs: Good air movement bilaterally, CTAB Chest: sternal scar, healing well CVS: RRR,No Gallops,Rubs or new Murmurs Abd: soft, Non tender, non distended, BS present Extremities: No Cyanosis, Clubbing or edema Skin: no new rashes Psych: flat affect    Data Reviewed: I have personally reviewed following labs and imaging studies:  CBC: Recent Labs  Lab 01/14/18 0348 01/15/18 0702 01/18/18 0755 01/20/18 0653  WBC 14.0* 18.4* 13.3* 18.6*  HGB 8.6* 8.8* 9.7* 9.7*  HCT 27.3* 27.3* 29.1* 29.0*  MCV 93.8 91.6 89.0 88.7  PLT 164 191 159 056   Basic Metabolic Panel: Recent Labs  Lab 01/14/18 0348  01/16/18 1708  01/17/18 1525 01/18/18 0755 01/18/18 1940 01/19/18 1606 01/20/18 0653  NA 140   < > 140   < > 138 138 140 134* 132*  K 4.0   < > 4.6   < > 5.2* 3.5 4.9 4.2 4.1  CL 102   < > 103   < > 100 98 100 94* 92*  CO2 23   < > 22   < > 21* 23 24 23 28   GLUCOSE 142*   < > 93   < > 158* 117* 222* 390* 230*  BUN 32*   < > 51*   < > 68* 82* 29* 50* 56*  CREATININE 3.75*   < > 6.40*   < > 7.67* 8.74* 4.75* 6.63* 7.70*  CALCIUM 9.0   < > 9.1   < > 9.2 9.1  8.3* 8.7* 8.8*  MG 2.7*  --   --   --   --   --   --   --   --   PHOS  --    < > 3.5  --  4.8* 4.4 2.6 2.4*  --    < > = values in this interval not displayed.   GFR: Estimated Creatinine Clearance: 13.1 mL/min (A) (by C-G formula based on SCr of 7.7 mg/dL (H)). Liver Function Tests: Recent Labs  Lab 01/14/18 0348  01/17/18 0443 01/17/18 1525 01/18/18 0755 01/18/18 1940 01/19/18 1606  AST 565*  --  89*  --   --   --   --   ALT 2,540*  --  840*  --   --   --   --   ALKPHOS 193*  --  182*  --   --   --   --   BILITOT 3.9*  --  3.2*  --   --   --   --   PROT 6.4*  --  5.8*  --   --   --   --   ALBUMIN 2.7*   < > 2.3* 2.3* 2.2* 1.9* 2.1*    < > = values in this interval not displayed.   No results for input(s): LIPASE, AMYLASE in the last 168 hours. No results for input(s): AMMONIA in the last 168 hours. Coagulation Profile: Recent Labs  Lab 01/16/18 0349 01/17/18 0443 01/18/18 0755 01/19/18 0348 01/20/18 0653  INR 1.96 1.72 1.83 1.91 1.92   Cardiac Enzymes: No results for input(s): CKTOTAL, CKMB, CKMBINDEX, TROPONINI in the last 168 hours. BNP (last 3 results) No results for input(s): PROBNP in the last 8760 hours. HbA1C: No results for input(s): HGBA1C in the last 72 hours. CBG: Recent Labs  Lab 01/19/18 1130 01/19/18 1616 01/19/18 2209 01/20/18 0957 01/20/18 1253  GLUCAP 300* 395* 278* 142* 149*   Lipid Profile: No results for input(s): CHOL, HDL, LDLCALC, TRIG, CHOLHDL, LDLDIRECT in the last 72 hours. Thyroid Function Tests: No results for input(s): TSH, T4TOTAL, FREET4, T3FREE, THYROIDAB in the last 72 hours. Anemia Panel: Recent Labs    01/18/18 0825  FERRITIN 4,025*  TIBC 175*  IRON 57   Urine analysis:    Component Value Date/Time   COLORURINE YELLOW 08/04/2013 [place] 08/04/2013 0249   LABSPEC 1.013 08/04/2013 0249   PHURINE 7.5 08/04/2013 0249   GLUCOSEU 500 (A) 08/04/2013 0249   HGBUR SMALL (A) 08/04/2013 0249   BILIRUBINUR NEGATIVE 08/04/2013 0249   KETONESUR NEGATIVE 08/04/2013 0249   PROTEINUR 100 (A) 08/04/2013 0249   UROBILINOGEN 0.2 08/04/2013 0249   NITRITE NEGATIVE 08/04/2013 0249   LEUKOCYTESUR NEGATIVE 08/04/2013 0249   Sepsis Labs: @LABRCNTIP (procalcitonin:4,lacticacidven:4)  ) No results found for this or any previous visit (from the past 240 hour(s)).       Radiology Studies: No results found.      Scheduled Meds: . atorvastatin  40 mg Oral q1800  . chlorhexidine  15 mL Mouth Rinse BID  . Chlorhexidine Gluconate Cloth  6 each Topical Q0600  . Chlorhexidine Gluconate Cloth  6 each Topical Q0600  . darbepoetin (ARANESP) injection -  DIALYSIS  60 mcg Intravenous Q Tue-HD  . feeding supplement (ENSURE ENLIVE)  237 mL Oral TID BM  . feeding supplement (NEPRO CARB STEADY)  237 mL Oral BID BM  . insulin aspart  0-9 Units Subcutaneous TID WC  . insulin glargine  10 Units Subcutaneous  QHS  . mouth rinse  15 mL Mouth Rinse q12n4p  . midodrine  5 mg Oral TID WC  . multivitamin  1 tablet Oral QHS  . sodium chloride flush  10-40 mL Intracatheter Q12H   Continuous Infusions: . sodium chloride Stopped (01/14/18 0624)  . ferric gluconate (FERRLECIT/NULECIT) IV Stopped (01/18/18 1337)     LOS: 11 days    Time spent: 25 minutes    Domenic Polite, MD [facility] 01/20/2018, 1:32 PM     - please page though AMION:  [email or web address] Password [email or web address] If 7PM-7AM, please contact night-coverage

## 2018-01-20 NOTE — Progress Notes (Signed)
Inpatient Diabetes Program Recommendations  AACE/ADA: New Consensus Statement on Inpatient Glycemic Control (2015)  Target Ranges:  Prepandial:   less than 140 mg/dL      Peak postprandial:   less than 180 mg/dL (1-2 hours)      Critically ill patients:  140 - 180 mg/dL   Lab Results  Component Value Date   GLUCAP 142 (H) 01/20/2018   HGBA1C 8.7 (H) 05/03/2015    Review of Glycemic Control Results for [name], [name] (MRN [id number]) as of 01/20/2018 11:07  Ref. Range 01/19/2018 11:30 01/19/2018 16:16 01/19/2018 22:09 01/20/2018 09:57  Glucose-Capillary Latest Ref Range: 70 - 99 mg/dL 300 (H) 395 (H) 278 (H) 142 (H)   Diabetes history: DM 2 Outpatient Diabetes medications: Lantus 15 units qhs, Novolog 10 units tid Current orders for Inpatient glycemic control: Novolog 0-9 units TID, Lantus 10 units QHS  Inpatient Diabetes Program Recommendations:    Am FSBS has improved with addition to Lantus. If post prandials continue to exceed 180 mg/dL, consider adding Novolog 3 units TID of meal coverage (assuming that patient is consuming >50% of meal).    Thanks, Bronson Curb, MSN, RNC-OB Diabetes Coordinator [phone] (8a-5p)

## 2018-01-21 ENCOUNTER — Inpatient Hospital Stay (HOSPITAL_COMMUNITY): Payer: Medicare Other

## 2018-01-21 DIAGNOSIS — I481 Persistent atrial fibrillation: Secondary | ICD-10-CM | POA: Diagnosis not present

## 2018-01-21 DIAGNOSIS — I959 Hypotension, unspecified: Secondary | ICD-10-CM

## 2018-01-21 DIAGNOSIS — Z978 Presence of other specified devices: Secondary | ICD-10-CM

## 2018-01-21 DIAGNOSIS — R279 Unspecified lack of coordination: Secondary | ICD-10-CM | POA: Diagnosis not present

## 2018-01-21 DIAGNOSIS — I480 Paroxysmal atrial fibrillation: Secondary | ICD-10-CM | POA: Diagnosis not present

## 2018-01-21 DIAGNOSIS — Z992 Dependence on renal dialysis: Secondary | ICD-10-CM | POA: Diagnosis not present

## 2018-01-21 DIAGNOSIS — E782 Mixed hyperlipidemia: Secondary | ICD-10-CM | POA: Diagnosis not present

## 2018-01-21 DIAGNOSIS — A419 Sepsis, unspecified organism: Secondary | ICD-10-CM | POA: Diagnosis not present

## 2018-01-21 DIAGNOSIS — N186 End stage renal disease: Secondary | ICD-10-CM | POA: Diagnosis not present

## 2018-01-21 DIAGNOSIS — Z4659 Encounter for fitting and adjustment of other gastrointestinal appliance and device: Secondary | ICD-10-CM | POA: Diagnosis not present

## 2018-01-21 DIAGNOSIS — R5381 Other malaise: Secondary | ICD-10-CM | POA: Diagnosis not present

## 2018-01-21 DIAGNOSIS — N2581 Secondary hyperparathyroidism of renal origin: Secondary | ICD-10-CM | POA: Diagnosis not present

## 2018-01-21 DIAGNOSIS — E440 Moderate protein-calorie malnutrition: Secondary | ICD-10-CM | POA: Diagnosis not present

## 2018-01-21 DIAGNOSIS — T8241XD Breakdown (mechanical) of vascular dialysis catheter, subsequent encounter: Secondary | ICD-10-CM | POA: Diagnosis not present

## 2018-01-21 DIAGNOSIS — I10 Essential (primary) hypertension: Secondary | ICD-10-CM | POA: Diagnosis not present

## 2018-01-21 DIAGNOSIS — E1129 Type 2 diabetes mellitus with other diabetic kidney complication: Secondary | ICD-10-CM | POA: Diagnosis not present

## 2018-01-21 DIAGNOSIS — E559 Vitamin D deficiency, unspecified: Secondary | ICD-10-CM | POA: Diagnosis not present

## 2018-01-21 DIAGNOSIS — I5022 Chronic systolic (congestive) heart failure: Secondary | ICD-10-CM | POA: Diagnosis not present

## 2018-01-21 DIAGNOSIS — D518 Other vitamin B12 deficiency anemias: Secondary | ICD-10-CM | POA: Diagnosis not present

## 2018-01-21 DIAGNOSIS — Z48812 Encounter for surgical aftercare following surgery on the circulatory system: Secondary | ICD-10-CM | POA: Diagnosis not present

## 2018-01-21 DIAGNOSIS — R1312 Dysphagia, oropharyngeal phase: Secondary | ICD-10-CM | POA: Diagnosis not present

## 2018-01-21 DIAGNOSIS — J811 Chronic pulmonary edema: Secondary | ICD-10-CM | POA: Diagnosis not present

## 2018-01-21 DIAGNOSIS — J189 Pneumonia, unspecified organism: Secondary | ICD-10-CM | POA: Diagnosis not present

## 2018-01-21 DIAGNOSIS — E039 Hypothyroidism, unspecified: Secondary | ICD-10-CM | POA: Diagnosis not present

## 2018-01-21 DIAGNOSIS — I314 Cardiac tamponade: Secondary | ICD-10-CM | POA: Diagnosis not present

## 2018-01-21 DIAGNOSIS — E7800 Pure hypercholesterolemia, unspecified: Secondary | ICD-10-CM | POA: Diagnosis not present

## 2018-01-21 DIAGNOSIS — J9601 Acute respiratory failure with hypoxia: Secondary | ICD-10-CM | POA: Diagnosis not present

## 2018-01-21 DIAGNOSIS — D631 Anemia in chronic kidney disease: Secondary | ICD-10-CM | POA: Diagnosis not present

## 2018-01-21 DIAGNOSIS — R570 Cardiogenic shock: Secondary | ICD-10-CM | POA: Diagnosis not present

## 2018-01-21 DIAGNOSIS — I4892 Unspecified atrial flutter: Secondary | ICD-10-CM | POA: Diagnosis not present

## 2018-01-21 DIAGNOSIS — E119 Type 2 diabetes mellitus without complications: Secondary | ICD-10-CM | POA: Diagnosis not present

## 2018-01-21 DIAGNOSIS — I313 Pericardial effusion (noninflammatory): Secondary | ICD-10-CM | POA: Diagnosis not present

## 2018-01-21 DIAGNOSIS — Z23 Encounter for immunization: Secondary | ICD-10-CM | POA: Diagnosis not present

## 2018-01-21 DIAGNOSIS — R4189 Other symptoms and signs involving cognitive functions and awareness: Secondary | ICD-10-CM | POA: Diagnosis not present

## 2018-01-21 DIAGNOSIS — Z743 Need for continuous supervision: Secondary | ICD-10-CM | POA: Diagnosis not present

## 2018-01-21 DIAGNOSIS — Z79899 Other long term (current) drug therapy: Secondary | ICD-10-CM | POA: Diagnosis not present

## 2018-01-21 DIAGNOSIS — D649 Anemia, unspecified: Secondary | ICD-10-CM | POA: Diagnosis not present

## 2018-01-21 DIAGNOSIS — E872 Acidosis: Secondary | ICD-10-CM | POA: Diagnosis not present

## 2018-01-21 DIAGNOSIS — M6281 Muscle weakness (generalized): Secondary | ICD-10-CM | POA: Diagnosis not present

## 2018-01-21 LAB — GLUCOSE, CAPILLARY
Glucose-Capillary: 271 mg/dL — ABNORMAL HIGH (ref 70–99)
Glucose-Capillary: 213 mg/dL — ABNORMAL HIGH (ref 70–99)
Glucose-Capillary: 213 mg/dL — ABNORMAL HIGH (ref 70–99)

## 2018-01-21 LAB — CBC
HCT: 27.8 % — ABNORMAL LOW (ref 39.0–52.0)
Hemoglobin: 9.3 g/dL — ABNORMAL LOW (ref 13.0–17.0)
MCH: 30 pg (ref 26.0–34.0)
MCHC: 33.5 g/dL (ref 30.0–36.0)
MCV: 89.7 fL (ref 78.0–100.0)
Platelets: 179 10*3/uL (ref 150–400)
RBC: 3.1 MIL/uL — ABNORMAL LOW (ref 4.22–5.81)
RDW: 20.3 % — ABNORMAL HIGH (ref 11.5–15.5)
WBC: 14.3 10*3/uL — ABNORMAL HIGH (ref 4.0–10.5)

## 2018-01-21 LAB — PROTIME-INR
INR: 1.79
Prothrombin Time: 20.6 seconds — ABNORMAL HIGH (ref 11.4–15.2)

## 2018-01-21 MED ORDER — INSULIN ASPART 100 UNIT/ML ~~LOC~~ SOLN: 1.0000 [IU] | Freq: Three times a day (TID) | SUBCUTANEOUS | Status: AC

## 2018-01-21 MED ORDER — MIDODRINE HCL 5 MG PO TABS: 5.0000 mg | ORAL_TABLET | Freq: Three times a day (TID) | ORAL | Status: DC

## 2018-01-21 NOTE — Progress Notes (Addendum)
Inpatient Diabetes Program Recommendations  AACE/ADA: New Consensus Statement on Inpatient Glycemic Control (2015)  Target Ranges:  Prepandial:   less than 140 mg/dL      Peak postprandial:   less than 180 mg/dL (1-2 hours)      Critically ill patients:  140 - 180 mg/dL   Results for VERGIL, BURBY (MRN [id number]) as of 01/21/2018 10:18  Ref. Range 01/20/2018 09:57 01/20/2018 12:53 01/20/2018 17:08 01/20/2018 21:16 01/21/2018 08:10  Glucose-Capillary Latest Ref Range: 70 - 99 mg/dL 142 (H) 149 (H) 194 (H) 271 (H) 213 (H)   Review of Glycemic Control  Diabetes history: DM 2 Outpatient Diabetes medications: Lantus 15 units qhs, Novolog 10 units tid Current orders for Inpatient glycemic control: Lantus 10 units qhs, Novolog 0-9 units tid  Inpatient Diabetes Program Recommendations:  Renal function improving slightly. Glucose trends increasing. Will watch for today. If trends continue to increase and stay elevated may consider increasing Lantus up to home dose of 15 units.  Noted PO intake is variable ad not above 50% of meal consumed. Meal coverage not appropriate at this time.  Thanks,  Tama Headings RN, MSN, BC-ADM Inpatient Diabetes Coordinator Team Pager [phone] (8a-5p)

## 2018-01-21 NOTE — Clinical Social Work Placement (Signed)
   CLINICAL SOCIAL WORK PLACEMENT  NOTE 01/21/18 - DISCHARGED TO [facility] VIA AMBULANCW   Date:  01/21/2018  Patient Details  Name: [name] MRN: [id number] Date of Birth: 04/26/1951  Clinical Social Work is seeking post-discharge placement for this patient at the [name] level of care (*CSW will initial, date and re-position this form in  chart as items are completed):  Yes   Patient/family provided with [facility] Work Department's list of facilities offering this level of care within the geographic area requested by the patient (or if unable, by the patient's family).  Yes   Patient/family informed of their freedom to choose among providers that offer the needed level of care, that participate in Medicare, Medicaid or managed care program needed by the patient, have an available bed and are willing to accept the patient.  Yes   Patient/family informed of [facility]'s ownership interest in [facility] and [facility], as well as of the fact that they are under no obligation to receive care at these facilities.  PASRR submitted to EDS on       PASRR number received on       Existing PASRR number confirmed on 01/17/18     FL2 transmitted to all facilities in geographic area requested by pt/family on 01/20/18     FL2 transmitted to all facilities within larger geographic area on       Patient informed that his/her managed care company has contracts with or will negotiate with certain facilities, including the following:        Yes   Patient/family informed of bed offers received.  Patient chooses bed at [facility]     Physician recommends and patient chooses bed at      Patient to be transferred to [facility] on 01/21/18.  Patient to be transferred to facility by Ambulance     Patient family notified on 01/21/18 of transfer.  Name of family member notified:  [name] - son [phone])     PHYSICIAN       Additional Comment:    _______________________________________________ Sable Feil, LCSW 01/21/2018, 2:56 PM

## 2018-01-21 NOTE — Progress Notes (Signed)
Physical Therapy Treatment Patient Details Name: [name] MRN: [id number] DOB: [birth year] Today's Date: 01/21/2018    History of Present Illness Pt is a 67 y/o male admitted secondary to shock, found to have a uremic pericardial effusion and tamponade, s/p pericardial window, now with ongoing hypotension and known systolic CHF - EF 68%. Pt also with acute hypoxic respiratory failure with LLL PNA. PMH including but not limited to CKD, HTN and DM.    PT Comments    Pt sleeping in bed on arrival. Easily aroused and agreeable to participation in therapy. Pt required mod assist supine to sit, +2 mod assist sit to stand, and +2 mod assist stand-pivot transfer. Pt positioned in recliner with feet elevated at end of session.    Follow Up Recommendations  SNF     Equipment Recommendations  None recommended by PT    Recommendations for Other Services       Precautions / Restrictions Precautions Precautions: Fall Precaution Comments: L IJ dialysis cath    Mobility  Bed Mobility Overal bed mobility: Needs Assistance       Supine to sit: Mod assist;HOB elevated     General bed mobility comments: cues for sequencing, assist to elevate trunk  Transfers Overall transfer level: Needs assistance Equipment used: 2 person hand held assist Transfers: Sit to/from Stand;Stand Pivot Transfers Sit to Stand: Mod assist;+2 physical assistance Stand pivot transfers: Mod assist;+2 physical assistance       General transfer comment: unable to attain full, upright stance; assist to power up and maintain balance; pt able to take pivot steps bed to recliner toward the right; continuous cues for sequencing   Ambulation/Gait                 Stairs             Wheelchair Mobility    Modified Rankin (Stroke Patients Only)       Balance Overall balance assessment: Needs assistance Sitting-balance support: Feet supported;No upper extremity supported Sitting balance-Leahy  Scale: Fair     Standing balance support: During functional activity;Single extremity supported Standing balance-Leahy Scale: Zero Standing balance comment: mod assist of 2                            Cognition Arousal/Alertness: Awake/alert(fatigued) Behavior During Therapy: WFL for tasks assessed/performed Overall Cognitive Status: No family/caregiver present to determine baseline cognitive functioning                                 General Comments: appropriate and follows commands, slow processing      Exercises      General Comments        Pertinent Vitals/Pain Pain Assessment: Faces Faces Pain Scale: No hurt    Home Living                      Prior Function            PT Goals (current goals can now be found in the care plan section) Acute Rehab PT Goals Patient Stated Goal: Did not state PT Goal Formulation: With patient Time For Goal Achievement: 01/29/18 Potential to Achieve Goals: Fair Progress towards PT goals: Progressing toward goals    Frequency    Min 2X/week      PT Plan Current plan remains appropriate    Co-evaluation  AM-PAC PT "6 Clicks" Daily Activity  Outcome Measure  Difficulty turning over in bed (including adjusting bedclothes, sheets and blankets)?: A Lot Difficulty moving from lying on back to sitting on the side of the bed? : Unable Difficulty sitting down on and standing up from a chair with arms (e.g., wheelchair, bedside commode, etc,.)?: Unable Help needed moving to and from a bed to chair (including a wheelchair)?: A Lot Help needed walking in hospital room?: A Lot Help needed climbing 3-5 steps with a railing? : Total 6 Click Score: 9    End of Session Equipment Utilized During Treatment: Gait belt Activity Tolerance: Patient tolerated treatment well Patient left: in chair;with chair alarm set;with call bell/phone within reach Nurse Communication: Mobility  status;Need for lift equipment PT Visit Diagnosis: Other abnormalities of gait and mobility (R26.89);Muscle weakness (generalized) (M62.81)     Time: 8937-3428 PT Time Calculation (min) (ACUTE ONLY): 13 min  Charges:  $Therapeutic Activity: 8-22 mins                     Lorrin Goodell, PT  Office # [phone] Pager [phone]    Lorriane Shire 01/21/2018, 9:30 AM

## 2018-01-21 NOTE — Progress Notes (Signed)
Progress Note  Patient Name: [name] Date of Encounter: 01/21/2018  Primary Cardiologist:  Camnitz  Subjective   67 year old gentleman with end-stage renal disease.  He has chronic systolic congestive heart failure.  He was admitted with hypotension and rapid atrial fibrillation.  Was found to have pericardial tamponade.  He is now status post pericardial window.     BP has been low.  They were not able to get any volume off during hemodialysis yesterday.  Inpatient Medications    Scheduled Meds: . atorvastatin  40 mg Oral q1800  . chlorhexidine  15 mL Mouth Rinse BID  . Chlorhexidine Gluconate Cloth  6 each Topical Q0600  . Chlorhexidine Gluconate Cloth  6 each Topical Q0600  . darbepoetin (ARANESP) injection - DIALYSIS  60 mcg Intravenous Q Tue-HD  . feeding supplement (ENSURE ENLIVE)  237 mL Oral TID BM  . feeding supplement (NEPRO CARB STEADY)  237 mL Oral BID BM  . insulin aspart  0-9 Units Subcutaneous TID WC  . insulin glargine  10 Units Subcutaneous QHS  . mouth rinse  15 mL Mouth Rinse q12n4p  . midodrine  5 mg Oral TID WC  . multivitamin  1 tablet Oral QHS  . sodium chloride flush  10-40 mL Intracatheter Q12H   Continuous Infusions: . sodium chloride Stopped (01/14/18 0624)  . ferric gluconate (FERRLECIT/NULECIT) IV Stopped (01/18/18 1337)   PRN Meds: sodium chloride, ondansetron (ZOFRAN) IV, sodium chloride flush   Vital Signs    Vitals:   01/20/18 1255 01/20/18 2115 01/21/18 0527 01/21/18 0528  BP: (!) 84/51 (!) 105/55 123/71   Pulse: 100 76 85 84  Resp: 20  18   Temp:  100.2 F (37.9 C) 98.2 F (36.8 C) 98.6 F (37 C)  TempSrc:  Oral Oral Oral  SpO2: (!) 87% 97% 95% 98%  Weight:      Height:        Intake/Output Summary (Last 24 hours) at 01/21/2018 1039 Last data filed at 01/21/2018 0600 Gross per 24 hour  Intake 260 ml  Output 0 ml  Net 260 ml   Filed Weights   01/18/18 1202 01/20/18 0745 01/20/18 1213  Weight: 108.5 kg 111.9 kg  111.9 kg    Telemetry     - Personally Reviewed  ECG     - Personally Reviewed  Physical Exam    Physical Exam: Blood pressure 123/71, pulse 84, temperature 98.6 F (37 C), temperature source Oral, resp. rate 18, height 6\' 3"  (1.905 m), weight 111.9 kg, SpO2 98 %.  GEN:  Well nourished, well developed in no acute distress HEENT: Normal NECK: No JVD; No carotid bruits LYMPHATICS: No lymphadenopathy CARDIAC: Irreg. Irreg. , no murmurs, rubs, gallops RESPIRATORY:  Clear to auscultation without rales, wheezing or rhonchi  ABDOMEN: Soft, non-tender, non-distended MUSCULOSKELETAL:  No edema; No deformity  SKIN: Warm and dry NEUROLOGIC:  More awake today       Labs    Chemistry Recent Labs  Lab 01/17/18 0443  01/18/18 1940 01/19/18 1606 01/20/18 0653 01/20/18 1550  NA 140   < > 140 134* 132* 134*  K 4.5   < > 4.9 4.2 4.1 3.5  CL 102   < > 100 94* 92* 96*  CO2 23   < > 24 23 28 30   GLUCOSE 122*   < > 222* 390* 230* 193*  BUN 61*   < > 29* 50* 56* 22  CREATININE 7.21*   < > 4.75* 6.63*  7.70* 3.88*  CALCIUM 9.2   < > 8.3* 8.7* 8.8* 8.3*  PROT 5.8*  --   --   --   --   --   ALBUMIN 2.3*   < > 1.9* 2.1*  --  2.1*  AST 89*  --   --   --   --   --   ALT 840*  --   --   --   --   --   ALKPHOS 182*  --   --   --   --   --   BILITOT 3.2*  --   --   --   --   --   GFRNONAA 7*   < > 12* 8* 7* 15*  GFRAA 8*   < > 14* 9* 8* 17*  ANIONGAP 15   < > 16* 17* 12 8   < > = values in this interval not displayed.     Hematology Recent Labs  Lab 01/15/18 0702 01/18/18 0755 01/20/18 0653  WBC 18.4* 13.3* 18.6*  RBC 2.98* 3.27* 3.27*  HGB 8.8* 9.7* 9.7*  HCT 27.3* 29.1* 29.0*  MCV 91.6 89.0 88.7  MCH 29.5 29.7 29.7  MCHC 32.2 33.3 33.4  RDW 16.8* 19.3* 20.3*  PLT 191 159 161    Cardiac EnzymesNo results for input(s): TROPONINI in the last 168 hours. No results for input(s): TROPIPOC in the last 168 hours.   BNPNo results for input(s): BNP, PROBNP in the last 168  hours.   DDimer No results for input(s): DDIMER in the last 168 hours.   Radiology    No results found.  Cardiac Studies     Patient Profile     67 y.o. male admitted with hypotension and found to have a large hemorrhagic effusion.  Status post pericardial window.  Assessment & Plan    1.  Pericardial tamponade: stable, s/p pericardial window.    2.  Atrial fibrillation:  remains in AFib . BP still remains very low.  Unable to add any rate controlling meds.    3.  Chronic systolic congestive heart failure:   unable to tolerate any CHF meds.   Continue dialysis   Cardiology will sign off.    Continue dialysis   [facility] will sign off.   Medication Recommendations:  Continue current meds  Other recommendations (labs, testing, etc):   Follow up as an outpatient:  With Dr. Curt Bears to evaluate if / when he would be able to restart coumadin ( hx of hemorrhagic pericardial effusion)    For questions or updates, please contact [facility] Please consult [email or web address] for contact info under Cardiology/STEMI.      Signed, Mertie Moores, MD  01/21/2018, 10:39 AM

## 2018-01-21 NOTE — Discharge Summary (Signed)
Physician Discharge Summary  John DULUDE GYI:[id number] DOB: 1951/05/29 DOA: 01/09/2018  PCP: Burnard Bunting, MD  Admit date: 01/09/2018 Discharge date: 01/21/2018  Time spent: 45 minutes  Recommendations for Outpatient Follow-up:  1. PCP in one week 2. Cardiologist Dr.Camnitz in 1 month, please assess timing to resume anticoagulation with , Coumadin   Discharge Diagnoses:    Acute hypoxic respiratory failure   Cardiogenic shock   Pericardial effusion with tamponade   End-stage renal disease on hemodialysis Tuesday Thursday Saturday   Pericardial effusion with cardiac tamponade   Acute respiratory failure with hypoxia (HCC)   Metabolic acidosis   Elevated LFTs   Idiopathic acute pancreatitis without infection or necrosis   Toxic metabolic encephalopathy   Malnutrition of moderate degree   Discharge Condition: stable  Diet recommendation: renal  Filed Weights   01/18/18 1202 01/20/18 0745 01/20/18 1213  Weight: 108.5 kg 111.9 kg 111.9 kg    History of present illness:  Mr. [name] is a 67 y.o. M with ESRD on HD, HTN, DM, hx of NHL, PVD, hx of IVC, hx of pAF on warfarin, and CHF EF 20% who presented from the dialysis center with malaise, hypotension, found to have pericardial effusion with tamponade.  Hospital Course:   Pericardial effusion -hemorrhagic pericardial effusion with tamponade -Cardiology and CTS consulted, S/p pericardial window 8/12 -culture negative, path showed fibrinous pericarditis, no malignancy, op note described "large amount of bloody fluid" evacuated). Repeat  Echo 8/16 showed no recurrence of effusion.   -discontinued warfarin -appreciate cardiology and CTS input, anticoagulation discontinued, reevaluate in 4-6 weeks  Acute hypoxic respiratory failure -Secondary to pericardial effusion with tamponade, hypotension -Intubated on 8/12 and extubated on 8/14 -Improved  ESRD on HD -hemodialysis per renal  Diabetes -resumed  Home dose Lantus,  sliding-scale insulin  Hypertension Vascular disease secondary prevention -Hold home BiDil, metoprolol -Continue midodrine -Started atorvastatin  Moderate protein calorie malnutrition -intake is improving -Continue supplements, advanced to renal diet  Anemia Stable -continue Epo per nephrology  Acute metabolic encephalopathy -improving, much more alert, interactive and participating in therapy  Paroxysmal atrial fibrillation -stopped warfarin, see above  Remote DVT -More than 15 years ago per patient -Stopped warfarin due to large hemorrhagic pericardial effusion   Consultants:   GI  General surgery  Nephrology  Cardiothoracic surgery  Cardiology  Critical care  Procedures:   8/12 intubation   8/12 central line placement, initiated pressors  8/12 echocardiogram LV EF: 20%  Discharge Exam: Vitals:   01/21/18 0528 01/21/18 1306  BP:    Pulse: 84   Resp:    Temp: 98.6 F (37 C) 98.1 F (36.7 C)  SpO2: 98%     General: AAOx3 Cardiovascular: S1S2/RRR Respiratory: CTAB  Discharge Instructions   Discharge Instructions    Diet - low sodium heart healthy   Complete by:  As directed    Increase activity slowly   Complete by:  As directed      Allergies as of 01/21/2018   No Known Allergies     Medication List    STOP taking these medications   isosorbide-hydrALAZINE 20-37.5 MG tablet Commonly known as:  BIDIL   metoprolol succinate 25 MG 24 hr tablet Commonly known as:  TOPROL-XL   warfarin 5 MG tablet Commonly known as:  COUMADIN     TAKE these medications   acetaminophen 325 MG tablet Commonly known as:  TYLENOL Take 650 mg by mouth every 6 (six) hours as needed (pain).   ergocalciferol 50000 units  capsule Commonly known as:  VITAMIN D2 Take 50,000 Units by mouth once a week. Monday   insulin aspart 100 UNIT/ML injection Commonly known as:  novoLOG Inject 1-9 Units into the skin 3 (three) times daily before  meals. What changed:  how much to take   insulin glargine 100 UNIT/ML injection Commonly known as:  LANTUS Inject 0.15 mLs (15 Units total) into the skin at bedtime.   lidocaine-prilocaine cream Commonly known as:  EMLA Apply 1 application topically as needed (Apply small amount to access site 1-2 hours before dialysis. Cover with occlusive dressing (saran wrap)).   midodrine 5 MG tablet Commonly known as:  PROAMATINE Take 1 tablet (5 mg total) by mouth 3 (three) times daily with meals.   multivitamin Tabs tablet Take 1 tablet by mouth at bedtime.   polysaccharide iron 150 MG capsule Generic drug:  iron polysaccharides Take 1 capsule (150 mg total) by mouth daily.   sevelamer carbonate 800 MG tablet Commonly known as:  RENVELA Take 1,600 mg by mouth 3 (three) times daily with meals.      No Known Allergies    The results of significant diagnostics from this hospitalization (including imaging, microbiology, ancillary and laboratory) are listed below for reference.    Significant Diagnostic Studies: Dg Chest 2 View  Result Date: 01/09/2018 CLINICAL DATA:  Dialysis treatment today. Patient not feeling well since that time. EXAM: CHEST - 2 VIEW COMPARISON:  March 05, 2016 FINDINGS: Stable cardiomegaly. Increased opacity in left retrocardiac region may represent atelectasis or infiltrate. This is best seen on the lateral view. The hila and mediastinum are normal. No pneumothorax. No pulmonary nodules or masses. IMPRESSION: Left retrocardiac opacity could represent atelectasis or infiltrate. Recommend clinical correlation and follow-up to resolution. Electronically Signed   By: Dorise Bullion III M.D   On: 01/09/2018 16:08   Dg Abd 1 View  Result Date: 01/10/2018 CLINICAL DATA:  Orogastric tube placement EXAM: ABDOMEN - 1 VIEW COMPARISON:  CT from earlier the same day FINDINGS: Enteric tube has been advanced into the stomach which is nondilated. Visualized bowel gas pattern  normal. IVC filter at the L2 level. IMPRESSION: Enteric tube to the gastric fundus.  Normal bowel gas pattern. Electronically Signed   By: Lucrezia Europe [name]   On: 01/10/2018 08:00   Dg Abd 1 View  Result Date: 01/09/2018 CLINICAL DATA:  Abdominal pain EXAM: ABDOMEN - 1 VIEW COMPARISON:  None. FINDINGS: Scattered large and small bowel gas is noted. IVC filter is noted in place. Diffuse vascular calcifications are seen. No acute bony abnormality is noted. IMPRESSION: No acute abnormality seen. Electronically Signed   By: Inez Catalina [name]   On: 01/09/2018 23:13   Ct Abdomen Pelvis W Contrast  Result Date: 01/10/2018 CLINICAL DATA:  Abdominal pain, sepsis, hypotension. Severe acute abdominal pain and shock. History of end-stage renal disease on hemodialysis. EXAM: CT ABDOMEN AND PELVIS WITH CONTRAST TECHNIQUE: Multidetector CT imaging of the abdomen and pelvis was performed using the standard protocol following bolus administration of intravenous contrast. CONTRAST:  174mL OMNIPAQUE IOHEXOL 300 MG/ML  SOLN COMPARISON:  None. FINDINGS: Lower chest: Small left pleural effusion. Consolidation in the left lung base may be due to atelectasis or pneumonia. Less prominent atelectasis or infiltration in the right lung base. Large pericardial effusion. Heart size is normal. Hepatobiliary: Diffuse fatty infiltration of the liver. There is a large stone in the gallbladder. Gallbladder wall is thickened and there is edema around the gallbladder suggesting cholecystitis. No  bile duct dilatation. Pancreas: Hazy outline of the pancreas particularly around the head with infiltration in the peripancreatic fat. Changes are consistent with acute pancreatitis. No loculated collections identified. Spleen: Normal in size without focal abnormality. Adrenals/Urinary Tract: Adrenal glands are unremarkable. Kidneys are normal, without renal calculi, focal lesion, or hydronephrosis. Bladder is decompressed. Stomach/Bowel: Stomach, small  bowel, and colon are not abnormally distended. No wall thickening is identified although under distention limits evaluation of bowel wall. No inflammatory changes suggested. Appendix is normal. Vascular/Lymphatic: Aortic atherosclerosis. Prominent vascular calcifications throughout the abdomen and pelvis. No enlarged abdominal or pelvic lymph nodes. Inferior vena caval filter present. Reproductive: Prostate is unremarkable. Other: No free air or free fluid in the abdomen. Small periumbilical hernias containing fat. Lipoma in the left inguinal canal. Musculoskeletal: Degenerative changes in the spine. Bridging anterior osteophytes. Disc space narrowing with endplate sclerosis and erosion demonstrated at the lumbosacral inter space. This was present on a previous study from 03/10/2016 although there is progression. The previous study demonstrated degenerative changes with Schmorl's nodes period changes today likely also represent degenerative change although discitis is not entirely excluded. Consider MRI for further evaluation if clinically indicated. IMPRESSION: 1. Cholelithiasis with gallbladder wall thickening and pericholecystic edema suggesting cholecystitis. 2. Infiltration in the peripancreatic fat consistent with acute pancreatitis. No loculated collections. 3. Diffuse fatty infiltration of the liver. 4. Small left pleural effusion. Consolidation in the left lung base may be due to atelectasis or pneumonia. Less prominent atelectasis or infiltration in the right lung base. 5. Large pericardial effusion. 6. Small periumbilical hernias containing fat. Lipoma in the left inguinal canal. 7. Disc space narrowing and erosion at the lumbosacral junction. This was present on the previous study from 2017 and likely represents degenerative change although progression is shown. Consider MRI for further evaluation if clinically indicated. 8. Aortic atherosclerosis with extensive vascular calcifications. Electronically  Signed   By: Lucienne Capers [name]   On: 01/10/2018 02:28   Dg Chest Port 1 View  Result Date: 01/21/2018 CLINICAL DATA:  Follow-up pulmonary edema EXAM: PORTABLE CHEST 1 VIEW COMPARISON:  01/13/2018 FINDINGS: Cardiac shadow is mildly enlarged but stable. Aortic calcifications are again seen. Jugular catheters have been removed bilaterally. Lungs are well aerated bilaterally. Persistent left basilar infiltrate with associated small effusion is seen. No bony abnormality is noted. IMPRESSION: Persistent left basilar changes.  No other focal abnormality noted. Electronically Signed   By: Inez Catalina [name]   On: 01/21/2018 10:55   Dg Chest Port 1 View  Result Date: 01/13/2018 CLINICAL DATA:  Hemodialysis catheter malfunction. A new line has been placed. EXAM: PORTABLE CHEST 1 VIEW COMPARISON:  January 13, 2018 FINDINGS: The new right dialysis catheter terminates near the caval atrial junction. No pneumothorax. A left central line terminates in the central SVC. Mild edema is stable. More focal opacity and possible small effusion on the left is stable. Stable cardiomediastinal silhouette. IMPRESSION: 1. The new right central line terminates near the caval atrial junction without pneumothorax. Left central line is stable. 2. Persistent edema. 3. Persistent focal opacity in the left base with possible small associated effusion. Electronically Signed   By: Dorise Bullion III M.D   On: 01/13/2018 07:45   Dg Chest Port 1 View  Result Date: 01/13/2018 CLINICAL DATA:  Patient pulled out central line. Check placement. EXAM: PORTABLE CHEST 1 VIEW COMPARISON:  01/12/2018 FINDINGS: Right central venous catheter has been withdrawn since the previous study. Tip is now projected over the right clavicular head,  likely in the junction of the subclavian and jugular vein region. Advancement is suggested for better positioning. Left central venous catheter tip remains at the cavoatrial junction level. No pneumothorax. Shallow  inspiration. Heart size and pulmonary vascularity are enlarged. Mild bilateral perihilar infiltrations may represent edema or pneumonia. Probable consolidation or atelectasis in the left lung base behind the heart. Calcification of the aorta. IMPRESSION: Right central venous catheter tip now projects over the expected location of the junction of the subclavian and jugular vein region. Persistent bilateral perihilar infiltrates. Cardiac enlargement. Consolidation or atelectasis in the left lung base behind the heart. Electronically Signed   By: Lucienne Capers [name]   On: 01/13/2018 06:25   Dg Chest Port 1 View  Result Date: 01/12/2018 CLINICAL DATA:  Acute respiratory failure, hypoxia EXAM: PORTABLE CHEST 1 VIEW COMPARISON:  01/11/2018 FINDINGS: Support devices are stable. Mild cardiomegaly. Left lower lobe atelectasis or infiltrate again noted, similar to prior study. No confluent opacity on the right. No effusions or acute bony abnormality. IMPRESSION: Left lower lobe atelectasis or consolidation. Mild cardiomegaly. No real change. Electronically Signed   By: Rolm Baptise [name]   On: 01/12/2018 09:49   Dg Chest Port 1 View  Result Date: 01/11/2018 CLINICAL DATA:  Chest tube, pericardial drain EXAM: PORTABLE CHEST 1 VIEW COMPARISON:  None. FINDINGS: Support devices are stable. Mild cardiomegaly, vascular congestion and bibasilar atelectasis, slightly improved since prior study. IMPRESSION: Improving vascular congestion and bibasilar atelectasis. Electronically Signed   By: Rolm Baptise [name]   On: 01/11/2018 07:31   Dg Chest Port 1 View  Result Date: 01/11/2018 CLINICAL DATA:  Central line placement EXAM: PORTABLE CHEST 1 VIEW COMPARISON:  01/10/2018 FINDINGS: Endotracheal tube with tip measuring 6.5 cm above the carina. Enteric tube tip is off the field of view but below the left hemidiaphragm. Left central venous catheter is in place with tip over the low SVC region. A new right central venous catheter  is placed with tip over the mid SVC region. No pneumothorax. Mild cardiac enlargement. No vascular congestion. Atelectasis in the lung bases. Aortic calcification. IMPRESSION: Appliances appear in satisfactory position. Cardiac enlargement. Atelectasis in the lung bases. No pneumothorax. Electronically Signed   By: Lucienne Capers [name]   On: 01/11/2018 03:33   Dg Chest Port 1 View  Result Date: 01/10/2018 CLINICAL DATA:  Acute hypoxic respiratory failure. Shock. Pericardial effusion. EXAM: PORTABLE CHEST 1 VIEW COMPARISON:  Chest x-rays dated 01/10/2018 and CT scan of the abdomen dated 01/10/2018 FINDINGS: Endotracheal tube tip is 5.4 cm above the carina. Left central line tip is at the cavoatrial junction, unchanged. NG tube tip is in the fundus of the stomach. There is a tube at the left lung base which may represent a tube in the pericardial space. Cardiac silhouette is prominent but unchanged. Pulmonary vascularity is normal. Persistent consolidation in the left lower lobe. Right lung is clear. IMPRESSION: 1. Persistent left lower lobe pneumonia. 2. Persistent prominence of the cardiac silhouette. There appears to be a pericardial drainage tube in place. 3. Other support apparatus appears in good position. Electronically Signed   By: Lorriane Shire [name]   On: 01/10/2018 11:29   Portable Chest X-ray  Result Date: 01/10/2018 CLINICAL DATA:  67 y/o  M; ET tube placement. EXAM: PORTABLE CHEST 1 VIEW COMPARISON:  01/10/2018 chest radiograph. FINDINGS: Left central venous catheter tip is stable projecting over lower SVC. Endotracheal tube tip projects 5.3 cm above the carina. Enteric tube tip extends below the  field of view. Stable cardiomegaly and calcific aortic atherosclerosis. Stable left lower lobe airspace opacity and probable superimposed pulmonary edema. IMPRESSION: 1. Endotracheal tube tip 5.3 cm above carina. 2. Enteric tube tip below field of view in the abdomen. 3. Stable left central venous  catheter. 4. Stable left basilar opacity, probable pulmonary edema, cardiomegaly, aortic atherosclerosis. Electronically Signed   By: Kristine Garbe [name]   On: 01/10/2018 06:46   Dg Chest Port 1 View  Result Date: 01/10/2018 CLINICAL DATA:  Status post central line placement EXAM: PORTABLE CHEST 1 VIEW COMPARISON:  01/09/2018 FINDINGS: Left jugular central line is now seen at the cavoatrial junction. No pneumothorax is noted. Cardiac shadow is enlarged but stable. Left basilar atelectatic changes are again noted and stable. IMPRESSION: No pneumothorax following central line placement. The remainder of the exam is stable. Electronically Signed   By: Inez Catalina [name]   On: 01/10/2018 00:26   Dg Chest Port 1 View  Result Date: 01/09/2018 CLINICAL DATA:  Evaluate pneumonia. EXAM: PORTABLE CHEST 1 VIEW COMPARISON:  01/09/2018. FINDINGS: Stable cardiac enlargement and aortic atherosclerosis. Significantly diminished lung volumes. Mild pulmonary edema suspected. Left lower lobe airspace opacity is unchanged from previous exam. IMPRESSION: 1. No change in aeration to the left lung base compared with previous exam. 2. Suspect mild CHF. Aortic Atherosclerosis (ICD10-I70.0). Electronically Signed   By: Kerby Moors [name]   On: 01/09/2018 23:14   US Abdomen Limited Ruq  Result Date: 01/10/2018 CLINICAL DATA:  Abdominal pain. Abnormal CT scan. CT 01/11/2008 demonstrated cholelithiasis with suggestion of cholecystitis. Pancreatitis. EXAM: ULTRASOUND ABDOMEN LIMITED RIGHT UPPER QUADRANT COMPARISON:  CT abdomen and pelvis 01/10/2018 FINDINGS: Gallbladder: Large stone in the gallbladder measuring 2.2 cm diameter. Diffusely thickened gallbladder wall measuring up to 8 mm. Murphy's sign is negative. Common bile duct: Diameter: 4.9 mm, normal. Liver: No focal lesion identified. Within normal limits in parenchymal echogenicity. Portal vein is patent on color Doppler imaging with normal direction of blood flow  towards the liver. IMPRESSION: Cholelithiasis with diffuse gallbladder wall thickening. Negative Murphy's sign. Changes are nonspecific but may indicate acute cholecystitis in the appropriate clinical setting. Electronically Signed   By: Lucienne Capers [name]   On: 01/10/2018 03:35    Microbiology: No results found for this or any previous visit (from the past 240 hour(s)).   Labs: Basic Metabolic Panel: Recent Labs  Lab 01/17/18 1525 01/18/18 0755 01/18/18 1940 01/19/18 1606 01/20/18 0653 01/20/18 1550  NA 138 138 140 134* 132* 134*  K 5.2* 3.5 4.9 4.2 4.1 3.5  CL 100 98 100 94* 92* 96*  CO2 21* 23 24 23 28 30   GLUCOSE 158* 117* 222* 390* 230* 193*  BUN 68* 82* 29* 50* 56* 22  CREATININE 7.67* 8.74* 4.75* 6.63* 7.70* 3.88*  CALCIUM 9.2 9.1 8.3* 8.7* 8.8* 8.3*  PHOS 4.8* 4.4 2.6 2.4*  --  2.5   Liver Function Tests: Recent Labs  Lab 01/17/18 0443 01/17/18 1525 01/18/18 0755 01/18/18 1940 01/19/18 1606 01/20/18 1550  AST 89*  --   --   --   --   --   ALT 840*  --   --   --   --   --   ALKPHOS 182*  --   --   --   --   --   BILITOT 3.2*  --   --   --   --   --   PROT 5.8*  --   --   --   --   --  ALBUMIN 2.3* 2.3* 2.2* 1.9* 2.1* 2.1*   No results for input(s): LIPASE, AMYLASE in the last 168 hours. No results for input(s): AMMONIA in the last 168 hours. CBC: Recent Labs  Lab 01/15/18 0702 01/18/18 0755 01/20/18 0653 01/21/18 1308  WBC 18.4* 13.3* 18.6* 14.3*  HGB 8.8* 9.7* 9.7* 9.3*  HCT 27.3* 29.1* 29.0* 27.8*  MCV 91.6 89.0 88.7 89.7  PLT 191 159 161 179   Cardiac Enzymes: No results for input(s): CKTOTAL, CKMB, CKMBINDEX, TROPONINI in the last 168 hours. BNP: BNP (last 3 results) Recent Labs    01/09/18 1532  BNP 276.0*    ProBNP (last 3 results) No results for input(s): PROBNP in the last 8760 hours.  CBG: Recent Labs  Lab 01/20/18 1253 01/20/18 1708 01/20/18 2116 01/21/18 0810 01/21/18 1159  GLUCAP 149* 194* 271* 213* 213*        Signed:  Domenic Polite MD.  [facility] 01/21/2018, 1:48 PM

## 2018-01-21 NOTE — Progress Notes (Addendum)
[name] Progress Note   Subjective:  Seen in room. Sitting in recliner at bedside No c/os. Denies CP, SOB  BP low - no volume off on HD yesterday  Post HD wt 111.9kg   Objective Vitals:   01/20/18 1255 01/20/18 2115 01/21/18 0527 01/21/18 0528  BP: (!) 84/51 (!) 105/55 123/71   Pulse: 100 76 85 84  Resp: 20  18   Temp:  100.2 F (37.9 C) 98.2 F (36.8 C) 98.6 F (37 C)  TempSrc:  Oral Oral Oral  SpO2: (!) 87% 97% 95% 98%  Weight:      Height:       Physical Exam General: Alert NAD Heart: irreg  Lungs: CTAB  Abdomen: soft NT/ND Extremities: Trace LE edema  Dialysis Access: RUE AVF +bruit, generalized edema  Dialysis Orders:TTS [facility]  4h 7min   110kg   425/800   p4  3K/2.5 bath  RUA AVF  Hep 3000 (no heparin x 15month thru 9/12)  - hectorol 6 ug  - mircera 30ug in May  - venofer 50 /wk   home meds:  - insulin glargine 15u hs/ insulin aspart 10u tid ac  - isosorbide-hydralazine 20- 37.5 tid/ metoprolol succinate 25 mg qd  - warfarin as directed  - sevelamer 1600 tid/ mvi/ vitamins/ prns    Assessment/Plan: 1. S/p pericardial effusion/window - 01/10/18 by Dr [name] 2. Deconditioning - for SNF placement 3. ESRD-TTS HD. No heparin for a few months due to bloody pericardial effusion . Next HD 8/24.  AVF working well now. Last AVF intervention 08/02/17 [name] 50-60% stenosis. Still has some right arm edema, concern for some central stenosis.- follow up as outpatient 4. Anemia- hgb 9's last tsat 37% in July. Tsat here is OK at 33%. ESA/Fe resumed - Aranesp 60 q Tues 5. Secondary hyperparathyroidism- off Hectorol for now -wasn't started this admission and no corrected Ca is elevated - 6. Hypotension/volume - midodrine started here 5 tid. Had chf by cxr 8/14 at 112-114 kg, then dialyzed down to 107kg, now wt's drifting back up. Hx low EF 20%, home Bidil/ metoprolol are on hold.  Didn't tolerate any UF on Thursday d/t low BPsWill plan UF more on Sat.   7. Nutrition- alb 2.2, added prostat/nepro - adv diet as able - currently on FL- no intake recorded yesterday -  8. Shock - cardiogenic due to #1. ^LFTs resolving, shock liver 9. Afib/ hx DVT - rate controlled. Warfarin dc'd for "at least a few months" per primary due to #1 10. Dispo - ok for dc from renal standpoint    John Child PA-C [name] Pager [phone] 01/21/2018,9:12 AM  LOS: 12 days   Pt seen, examined and agree w A/P as above.  John Splinter MD [facility] pager [phone]   01/21/2018, 10:23 AM    Additional Objective Labs: Basic Metabolic Panel: Recent Labs  Lab 01/18/18 1940 01/19/18 1606 01/20/18 0653 01/20/18 1550  NA 140 134* 132* 134*  K 4.9 4.2 4.1 3.5  CL 100 94* 92* 96*  CO2 24 23 28 30   GLUCOSE 222* 390* 230* 193*  BUN 29* 50* 56* 22  CREATININE 4.75* 6.63* 7.70* 3.88*  CALCIUM 8.3* 8.7* 8.8* 8.3*  PHOS 2.6 2.4*  --  2.5   CBC: Recent Labs  Lab 01/15/18 0702 01/18/18 0755 01/20/18 0653  WBC 18.4* 13.3* 18.6*  HGB 8.8* 9.7* 9.7*  HCT 27.3* 29.1* 29.0*  MCV 91.6 89.0 88.7  PLT 191 159  161   Blood Culture    Component Value Date/Time   SDES FLUID PERICARDIAL 01/10/2018 1015   SPECREQUEST B 01/10/2018 1015   CULT  01/10/2018 1015    NO GROWTH 3 DAYS Performed at [facility], [place] [address], [place], [place] [address]    REPTSTATUS 01/13/2018 FINAL 01/10/2018 1015    Cardiac Enzymes: No results for input(s): CKTOTAL, CKMB, CKMBINDEX, TROPONINI in the last 168 hours. CBG: Recent Labs  Lab 01/20/18 0957 01/20/18 1253 01/20/18 1708 01/20/18 2116 01/21/18 0810  GLUCAP 142* 149* 194* 271* 213*   Iron Studies: No results for input(s): IRON, TIBC, TRANSFERRIN, FERRITIN in the last 72 hours. Lab Results  Component Value Date   INR 1.79 01/21/2018   INR 1.92 01/20/2018   INR 1.91 01/19/2018   Medications: . sodium chloride Stopped (01/14/18 0624)  . ferric gluconate  (FERRLECIT/NULECIT) IV Stopped (01/18/18 1337)   . atorvastatin  40 mg Oral q1800  . chlorhexidine  15 mL Mouth Rinse BID  . Chlorhexidine Gluconate Cloth  6 each Topical Q0600  . Chlorhexidine Gluconate Cloth  6 each Topical Q0600  . darbepoetin (ARANESP) injection - DIALYSIS  60 mcg Intravenous Q Tue-HD  . feeding supplement (ENSURE ENLIVE)  237 mL Oral TID BM  . feeding supplement (NEPRO CARB STEADY)  237 mL Oral BID BM  . insulin aspart  0-9 Units Subcutaneous TID WC  . insulin glargine  10 Units Subcutaneous QHS  . mouth rinse  15 mL Mouth Rinse q12n4p  . midodrine  5 mg Oral TID WC  . multivitamin  1 tablet Oral QHS  . sodium chloride flush  10-40 mL Intracatheter Q12H

## 2018-01-21 NOTE — Progress Notes (Signed)
Patient went to SNF via [facility]. Sent AVS with patient. IV taken out. Patient belongings sent with patient. Report given to RN at [facility].   Farley Ly RN

## 2018-01-21 NOTE — Progress Notes (Signed)
Tried to call to give report on patient four different times, and no one will pick up from the unit. Receptionist transferred my call to the 400s unit, but no one picked up from the unit. Will continue to call to give report.   Farley Ly RN

## 2018-01-21 NOTE — Progress Notes (Signed)
Tried to call to give report to RN at [facility] two more times, and receptionist stated she can't get in touch with anyone at this time. Gave my work phone number to receptionist to give to RN to call for report when ready.   Farley Ly RN

## 2018-01-22 DIAGNOSIS — E1129 Type 2 diabetes mellitus with other diabetic kidney complication: Secondary | ICD-10-CM | POA: Diagnosis not present

## 2018-01-22 DIAGNOSIS — N2581 Secondary hyperparathyroidism of renal origin: Secondary | ICD-10-CM | POA: Diagnosis not present

## 2018-01-22 DIAGNOSIS — D631 Anemia in chronic kidney disease: Secondary | ICD-10-CM | POA: Diagnosis not present

## 2018-01-22 DIAGNOSIS — N186 End stage renal disease: Secondary | ICD-10-CM | POA: Diagnosis not present

## 2018-01-25 DIAGNOSIS — E1129 Type 2 diabetes mellitus with other diabetic kidney complication: Secondary | ICD-10-CM | POA: Diagnosis not present

## 2018-01-25 DIAGNOSIS — N2581 Secondary hyperparathyroidism of renal origin: Secondary | ICD-10-CM | POA: Diagnosis not present

## 2018-01-25 DIAGNOSIS — D631 Anemia in chronic kidney disease: Secondary | ICD-10-CM | POA: Diagnosis not present

## 2018-01-25 DIAGNOSIS — N186 End stage renal disease: Secondary | ICD-10-CM | POA: Diagnosis not present

## 2018-01-27 DIAGNOSIS — N186 End stage renal disease: Secondary | ICD-10-CM | POA: Diagnosis not present

## 2018-01-27 DIAGNOSIS — N2581 Secondary hyperparathyroidism of renal origin: Secondary | ICD-10-CM | POA: Diagnosis not present

## 2018-01-27 DIAGNOSIS — D631 Anemia in chronic kidney disease: Secondary | ICD-10-CM | POA: Diagnosis not present

## 2018-01-27 DIAGNOSIS — E1129 Type 2 diabetes mellitus with other diabetic kidney complication: Secondary | ICD-10-CM | POA: Diagnosis not present

## 2018-01-29 DIAGNOSIS — N186 End stage renal disease: Secondary | ICD-10-CM | POA: Diagnosis not present

## 2018-01-29 DIAGNOSIS — N2581 Secondary hyperparathyroidism of renal origin: Secondary | ICD-10-CM | POA: Diagnosis not present

## 2018-01-29 DIAGNOSIS — E1129 Type 2 diabetes mellitus with other diabetic kidney complication: Secondary | ICD-10-CM | POA: Diagnosis not present

## 2018-01-29 DIAGNOSIS — D631 Anemia in chronic kidney disease: Secondary | ICD-10-CM | POA: Diagnosis not present

## 2018-01-30 DIAGNOSIS — E1129 Type 2 diabetes mellitus with other diabetic kidney complication: Secondary | ICD-10-CM | POA: Diagnosis not present

## 2018-01-30 DIAGNOSIS — Z992 Dependence on renal dialysis: Secondary | ICD-10-CM | POA: Diagnosis not present

## 2018-01-30 DIAGNOSIS — N186 End stage renal disease: Secondary | ICD-10-CM | POA: Diagnosis not present

## 2018-02-01 DIAGNOSIS — E1129 Type 2 diabetes mellitus with other diabetic kidney complication: Secondary | ICD-10-CM | POA: Diagnosis not present

## 2018-02-01 DIAGNOSIS — N2581 Secondary hyperparathyroidism of renal origin: Secondary | ICD-10-CM | POA: Diagnosis not present

## 2018-02-01 DIAGNOSIS — N186 End stage renal disease: Secondary | ICD-10-CM | POA: Diagnosis not present

## 2018-02-01 DIAGNOSIS — Z23 Encounter for immunization: Secondary | ICD-10-CM | POA: Diagnosis not present

## 2018-02-01 DIAGNOSIS — D631 Anemia in chronic kidney disease: Secondary | ICD-10-CM | POA: Diagnosis not present

## 2018-02-02 ENCOUNTER — Ambulatory Visit (INDEPENDENT_AMBULATORY_CARE_PROVIDER_SITE_OTHER): Payer: Medicare Other | Admitting: Physician Assistant

## 2018-02-02 ENCOUNTER — Encounter: Payer: Self-pay | Admitting: Physician Assistant

## 2018-02-02 VITALS — BP 131/65 | HR 80 | Ht 75.5 in | Wt 243.0 lb

## 2018-02-02 DIAGNOSIS — I959 Hypotension, unspecified: Secondary | ICD-10-CM

## 2018-02-02 DIAGNOSIS — Z992 Dependence on renal dialysis: Secondary | ICD-10-CM | POA: Diagnosis not present

## 2018-02-02 DIAGNOSIS — I4892 Unspecified atrial flutter: Secondary | ICD-10-CM

## 2018-02-02 DIAGNOSIS — N186 End stage renal disease: Secondary | ICD-10-CM | POA: Diagnosis not present

## 2018-02-02 DIAGNOSIS — I5022 Chronic systolic (congestive) heart failure: Secondary | ICD-10-CM

## 2018-02-02 NOTE — Patient Instructions (Signed)
Medication Instructions:  Your physician recommends that you continue on your current medications as directed. Please refer to the Current Medication list given to you today.  If you need a refill on your cardiac medications before your next appointment, please call your pharmacy.  Labwork: None  [employer] in our office  Testing/Procedures: None   Follow-Up: Your physician recommends that you schedule a follow-up appointment in: 1 month with Dr Debara Pickett ONLY.   Any Other Special Instructions Will Be Listed Below (If Applicable).

## 2018-02-02 NOTE — Progress Notes (Signed)
Cardiology Office Note:    Date:  02/02/2018   ID:  John, [name] 11-16-1950, MRN [id number]  PCP:  Burnard Bunting, MD  Cardiologist:  Pixie Casino, MD   Referring MD: Burnard Bunting, MD   Chief Complaint  Patient presents with  . Hospitalization Follow-up    pericardial effusion, tamponade, Afib    History of Present Illness:    [name] is a 67 y.o. male with a hx of end-stage renal disease on HD, diabetes, history of DVT (15 years ago per the patient), hyperlipidemia, hypertension, atrial fibrillation on Coumadin, and HL in 2009, diabetes, GERD, nonischemic cardiomyopathy with an EF of 20%.  He was diagnosed with atrial fibrillation in 2016 while on Coumadin.  Recent echo showed severe biventricular dysfunction with an EF of 15 to 20%; Myoview showed fixed medium sized apical inferolateral perfusion defect that could represent infarction without ischemia.  He had a heart cath that showed patent coronary arteries on 09/11/2016.  He presented to the ER on 01/10/2018 found to have a large pericardial effusion on CT performed for abdominal pain.  Cardiology was consulted and completed both a bedside echo and TTE that confirmed a large circumferential pericardial effusion consistent with tamponade physiology.  Unfortunately his INR was greater than 10.  He became unstable and was intubated.  He received 2 units of FFP without adequate reversal.  [facility] was consulted and ultimately performed a pericardial window.  He was also found to be in A. fib RVR.  He following the pericardial window, he has ongoing hypotension and was started on CRRT.  Atrial flutter also noted on telemetry.  He was weaned from pressors and extubated on 01/13/2018.  INR became subtherapeutic at 1.73.  Anticoagulation was held for his uremic hemorrhagic effusion.  Follow-up echo on 01/14/2018 showed resolution of effusion.  He remained in atrial fibrillation/flutter.  Rate controlling agents were limited by his  hypotension.  He was ultimately discharged on 01/21/2018.  He presented to clinic today for hospital follow-up.  [facility] is still at the SNF.  He is undergoing dialysis but states that they need to adjust his dry weight given his ongoing lower extremity edema.  I tend to agree with him.  He has not been restarted on anticoagulation given his hemorrhagic pericardial effusion.  He appears to be an atrial flutter today on EKG.  I will review the chart with Dr. Debara Pickett.  I will not restart anticoagulation at this time.  I had a long discussion with the patient regarding his stroke risk.  He understands the risk and agrees to hold off on anticoagulation at this time.  Overall, he is feeling much better since discharge.   Past Medical History:  Diagnosis Date  . Anemia   . Arthritis    HNP- lumbar, "all over my body"  . Blood transfusion    "years ago; blood was low" (08/05/2013)  . CKD (chronic kidney disease) stage 4, GFR 15-29 ml/min (HCC) 03/18/2012   [place]- T,TH,Sat.  . Diabetic nephropathy ([place])   . Diabetic retinopathy   . DVT (deep venous thrombosis) ([place])    "got one in my right leg now; I've had one before too, not sure which leg" (08/05/2013)  . ESRD (end stage renal disease) on dialysis [facility])    "just started today, (08/04/2013)"  . Family history of anesthesia complication    " my son wakes up slowly"  . GERD (gastroesophageal reflux disease)    uses alka seltzere  on occas.   [name])    "one q now and then" (08/05/2013)  . Hyperlipidemia   . Hypertension   . 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 Non Hodgkin's lymphoma ([place])    Tx 2009; "had chemo; it went away" (08/05/2013)  . Noncompliance 03/16/2012  . NSVT (nonsustained ventricular tachycardia) ([place]) 03/18/2012  . Peripheral vascular disease ([place])   . Pneumonia 2013   hosp.-   . Poor historian    pt. unsure of several answers  to health history questions   . Skin cancer    melanoma - head  . Sleep apnea    "suppose to have a sleep study, but they never told me when. (08/05/2013)    Past Surgical History:  Procedure Laterality Date  . ACHILLES TENDON SURGERY Right 03/13/2016   Procedure: ACHILLES LENGTHENING/KIDNER;  Surgeon: Edrick Kins, DPM;  Location: [place];  Service: Podiatry;  Laterality: Right;  . AV FISTULA PLACEMENT Left 02/03/2013   Procedure: ARTERIOVENOUS (AV) FISTULA CREATION- LEFT RADIAL CEPHALIC; ULTRASOUND GUIDED;  Surgeon: Mal Misty, MD;  [facility];  Service: Vascular;  Laterality: Left;  . AV FISTULA PLACEMENT Right 11/08/2015   Procedure: RIGHT BRACHIOCEPHALIC ARTERIOVENOUS (AV) FISTULA CREATION;  Surgeon: Serafina Mitchell, MD;  Location: [place];  Service: Vascular;  Laterality: Right;  . BASCILIC VEIN TRANSPOSITION Right 01/24/2016   Procedure: RIGHT SECOND STAGE BASILIC VEIN TRANSPOSITION;  Surgeon: Angelia Mould, MD;  Location: [place];  Service: Vascular;  Laterality: Right;  . CARDIAC CATHETERIZATION    . COLONOSCOPY N/A 09/23/2015   Procedure: COLONOSCOPY;  Surgeon: Irene Shipper, MD;  Location: [facility];  Service: Endoscopy;  Laterality: N/A;  . Coloscopy    . EYE SURGERY Bilateral   . GAS INSERTION  05/10/2012   Procedure: INSERTION OF GAS;  Surgeon: Hayden Pedro, MD;  Location: [place];  Service: Ophthalmology;  Laterality: Right;  . LEFT HEART CATH AND CORONARY ANGIOGRAPHY N/A 09/11/2016   Procedure: Left Heart Cath and Coronary Angiography;  Surgeon: Belva Crome, MD;  Location: [place] CV LAB;  Service: Cardiovascular;  Laterality: N/A;  . LESION EXCISION Right 05/08/2015   Procedure: EXCISION SCALP LESION;  Surgeon: Erroll Luna, MD;  Location: [place];  Service: General;  Laterality: Right;  . MEMBRANE PEEL  05/10/2012   Procedure: MEMBRANE PEEL;  Surgeon: Hayden Pedro, MD;  Location: [place];  Service: Ophthalmology;  Laterality: Right;  . PARS PLANA VITRECTOMY   08/27/2011   Procedure: PARS PLANA VITRECTOMY WITH 25 GAUGE;  Surgeon: Hayden Pedro, MD;  Location: [place];  Service: Ophthalmology;  Laterality: Left;  Repair of complex traction retinal detachment left eye  . PARS PLANA VITRECTOMY  05/10/2012   Procedure: PARS PLANA VITRECTOMY WITH 25 GAUGE;  Surgeon: Hayden Pedro, MD;  Location: [place];  Service: Ophthalmology;  Laterality: Right;  Repair Complex Traction Retinal Detachment  . PHOTOCOAGULATION WITH LASER  05/10/2012   Procedure: PHOTOCOAGULATION WITH LASER;  Surgeon: Hayden Pedro, MD;  Location: [place];  Service: Ophthalmology;  Laterality: Right;  . PORT-A-CATH REMOVAL    . PORTACATH PLACEMENT    . SUBXYPHOID PERICARDIAL WINDOW N/A 01/10/2018   Procedure: SUBXYPHOID PERICARDIAL WINDOW;  Surgeon: Rexene Alberts, MD;  Location: [place];  Service: Thoracic;  Laterality: N/A;  . TRANSMETATARSAL AMPUTATION Right 03/13/2016   Procedure: TRANSMETATARSAL AMPUTATION;  Surgeon: Ruby Cola  Carver Fila, DPM;  Location: [place];  Service: Podiatry;  Laterality: Right;  . VENA CAVA FILTER PLACEMENT  09/2012   due to preparation for surgery    Current Medications: Current Meds  Medication Sig  . acetaminophen (TYLENOL) 325 MG tablet Take 650 mg by mouth every 6 (six) hours as needed (pain).  . Cholecalciferol (VITAMIN D3) 50000 units CAPS Take 1 capsule by mouth daily.  . insulin aspart (NOVOLOG) 100 UNIT/ML injection Inject 1-9 Units into the skin 3 (three) times daily before meals.  . insulin glargine (LANTUS) 100 UNIT/ML injection Inject 0.15 mLs (15 Units total) into the skin at bedtime.  . lidocaine-prilocaine (EMLA) cream Apply 1 application topically as needed (Apply small amount to access site 1-2 hours before dialysis. Cover with occlusive dressing (saran wrap)).   . midodrine (PROAMATINE) 5 MG tablet Take 1 tablet (5 mg total) by mouth 3 (three) times daily with meals.  . MULTIPLE VITAMINS-MINERALS PO Take by mouth. 1 tablet PO daily  .  multivitamin (RENA-VIT) TABS tablet Take 1 tablet by mouth at bedtime.  . polysaccharide iron (NIFEREX) 150 MG CAPS capsule Take 1 capsule (150 mg total) by mouth daily.  . sevelamer carbonate (RENVELA) 800 MG tablet Take 1,600 mg by mouth 3 (three) times daily with meals.  . Skin Protectants, Misc. (MINERIN) CREA Apply topically. Topically every 12 hours  . vitamin C (ASCORBIC ACID) 500 MG tablet Take 500 mg by mouth 2 (two) times daily.  Marland Kitchen zinc sulfate 220 (50 Zn) MG capsule Take 220 mg by mouth daily.     Allergies:   Patient has no known allergies.   Social History   Socioeconomic History  . Marital status: Single    Spouse name: Not on file  . Number of children: 1  . Years of education: Not on file  . Highest education level: Not on file  Occupational History  . 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 Financial resource strain: Not on file  . Food insecurity:    Worry: Not on file    Inability: Not on file  . Transportation needs:    Medical: Not on file    Non-medical: Not on file  Tobacco Use  . Smoking status: Never Smoker  . Smokeless tobacco: Never Used  Substance and Sexual Activity  . Alcohol use: No    Alcohol/week: 0.0 standard drinks  . Drug use: No  . Sexual activity: Not Currently  Lifestyle  . Physical activity:    Days per week: Not on file    Minutes per session: Not on file  . Stress: Not on file  Relationships  . Social connections:    Talks on phone: Not on file    Gets together: Not on file    Attends religious service: Not on file    Active member of club or organization: Not on file    Attends meetings of clubs or organizations: Not on file    Relationship status: Not on file  Other Topics Concern  . Not on file  Social History Narrative  . Not on file     Family History: The patient's family history includes Anesthesia problems in his son; Diabetes in his father; Hypertension in his father and son; Other in  his father. There is no history of Colon cancer.  ROS:   Please see the history of present illness.    All other systems reviewed and are negative.  EKGs/Labs/Other  Studies Reviewed:    The following studies were reviewed today:  Echo 01/14/18: Study Conclusions  - Left ventricle: The cavity size was normal. Wall thickness was   increased in a pattern of moderate LVH. Systolic function was   moderately to severely reduced. The estimated ejection fraction   was in the range of 30% to 35%. Diffuse hypokinesis with inferior   akinesis. The study was not technically sufficient to allow   evaluation of LV diastolic dysfunction due to atrial   fibrillation. - Aortic valve: There was no stenosis. - Mitral valve: There was no significant regurgitation. - Right ventricle: The cavity size was mildly dilated. Systolic   function was mildly to moderately reduced. - Tricuspid valve: Peak RV-RA gradient (S): 29 mm Hg. - Pulmonary arteries: PA peak pressure: 44 mm Hg (S). - Systemic veins: IVC measured 2.4 cm with < 50% respirophasic   variation, suggesting RA pressure 15 mmHg. - Pericardium, extracardiac: Pleural effusion noted. There was no   pericardial effusion.  Impressions - Technically difficult study with poor acoustic windows. Normal LV   size with moderate LV hypertrophy. EF 30-35%, diffuse hypokinesis   with inferior akinesis. Mildly dilated RV with mild to moderately   decreased systolic function. Mild pulmonary hypertension. No   pericardial effusion.  EKG:  EKG is ordered today.  The ekg ordered today demonstrates Afib/flutter, heart rate 80  Recent Labs: 01/09/2018: B Natriuretic Peptide 276.0 01/14/2018: Magnesium 2.7 01/17/2018: ALT 840 01/20/2018: BUN 22; Creatinine, Ser 3.88; Potassium 3.5; Sodium 134 01/21/2018: Hemoglobin 9.3; Platelets 179  Recent Lipid Panel    Component Value Date/Time   CHOL 129 01/14/2011 0437   TRIG 137 01/14/2011 0437   HDL 13 (L)  01/14/2011 0437   CHOLHDL 9.9 01/14/2011 0437   VLDL 27 01/14/2011 0437   LDLCALC 89 01/14/2011 0437    Physical Exam:    VS:  BP 131/65   Pulse 80   Ht 6' 3.5" (1.918 m)   Wt 243 lb (110.2 kg)   BMI 29.97 kg/m     Wt Readings from Last 3 Encounters:  02/02/18 243 lb (110.2 kg)  01/20/18 246 lb 11.1 oz (111.9 kg)  10/14/16 219 lb 9.6 oz (99.6 kg)     GEN: Well nourished, well developed in no acute distress, in a wheelchair HEENT: Normal NECK: No JVD; No carotid bruits CARDIAC: irregular rhythm, regular rate RESPIRATORY:  Clear to auscultation without rales, wheezing or rhonchi  ABDOMEN: Soft, non-tender, non-distended MUSCULOSKELETAL:  + B LE edema; No deformity  SKIN: Warm and dry NEUROLOGIC:  Alert and oriented x 3 PSYCHIATRIC:  Normal affect   ASSESSMENT:    1. Chronic systolic heart failure ([place])   2. Atrial flutter, unspecified type ([place])   3. Hypotension, unspecified hypotension type   4. ESRD (end stage renal disease) on dialysis [facility])    PLAN:    In order of problems listed above:  Chronic systolic heart failure ([place]) - Plan: EKG 12-Lead Given his hypotension and need for [place] on HD days, unable to titrate any heart failure medications.  Will defer volume status to nephrology.  Atrial flutter, unspecified type ([place]) EKG appears to be atrial flutter today.  Anticoagulation still on hold given his recent hemorrhagic pericardial effusion.  I will discuss the chart with Dr. Debara Pickett.  Hypotension, unspecified hypotension type ESRD (end stage renal disease) on dialysis [facility]) Defer to nephrology.   Medication Adjustments/Labs and Tests Ordered: Current medicines are reviewed at length with the patient  today.  Concerns regarding medicines are outlined above.  Orders Placed This Encounter  Procedures  . EKG 12-Lead   No orders of the defined types were placed in this encounter.   Signed, Ledora Bottcher, [place]  02/02/2018 5:37 PM    [facility]

## 2018-02-03 DIAGNOSIS — Z23 Encounter for immunization: Secondary | ICD-10-CM | POA: Diagnosis not present

## 2018-02-03 DIAGNOSIS — D631 Anemia in chronic kidney disease: Secondary | ICD-10-CM | POA: Diagnosis not present

## 2018-02-03 DIAGNOSIS — N186 End stage renal disease: Secondary | ICD-10-CM | POA: Diagnosis not present

## 2018-02-03 DIAGNOSIS — E1129 Type 2 diabetes mellitus with other diabetic kidney complication: Secondary | ICD-10-CM | POA: Diagnosis not present

## 2018-02-03 DIAGNOSIS — N2581 Secondary hyperparathyroidism of renal origin: Secondary | ICD-10-CM | POA: Diagnosis not present

## 2018-02-05 DIAGNOSIS — N186 End stage renal disease: Secondary | ICD-10-CM | POA: Diagnosis not present

## 2018-02-05 DIAGNOSIS — E1129 Type 2 diabetes mellitus with other diabetic kidney complication: Secondary | ICD-10-CM | POA: Diagnosis not present

## 2018-02-05 DIAGNOSIS — N2581 Secondary hyperparathyroidism of renal origin: Secondary | ICD-10-CM | POA: Diagnosis not present

## 2018-02-05 DIAGNOSIS — D631 Anemia in chronic kidney disease: Secondary | ICD-10-CM | POA: Diagnosis not present

## 2018-02-05 DIAGNOSIS — Z23 Encounter for immunization: Secondary | ICD-10-CM | POA: Diagnosis not present

## 2018-02-08 DIAGNOSIS — N186 End stage renal disease: Secondary | ICD-10-CM | POA: Diagnosis not present

## 2018-02-08 DIAGNOSIS — D631 Anemia in chronic kidney disease: Secondary | ICD-10-CM | POA: Diagnosis not present

## 2018-02-08 DIAGNOSIS — E1129 Type 2 diabetes mellitus with other diabetic kidney complication: Secondary | ICD-10-CM | POA: Diagnosis not present

## 2018-02-08 DIAGNOSIS — N2581 Secondary hyperparathyroidism of renal origin: Secondary | ICD-10-CM | POA: Diagnosis not present

## 2018-02-08 DIAGNOSIS — Z23 Encounter for immunization: Secondary | ICD-10-CM | POA: Diagnosis not present

## 2018-02-10 DIAGNOSIS — Z23 Encounter for immunization: Secondary | ICD-10-CM | POA: Diagnosis not present

## 2018-02-10 DIAGNOSIS — E1129 Type 2 diabetes mellitus with other diabetic kidney complication: Secondary | ICD-10-CM | POA: Diagnosis not present

## 2018-02-10 DIAGNOSIS — N2581 Secondary hyperparathyroidism of renal origin: Secondary | ICD-10-CM | POA: Diagnosis not present

## 2018-02-10 DIAGNOSIS — N186 End stage renal disease: Secondary | ICD-10-CM | POA: Diagnosis not present

## 2018-02-10 DIAGNOSIS — D631 Anemia in chronic kidney disease: Secondary | ICD-10-CM | POA: Diagnosis not present

## 2018-02-11 ENCOUNTER — Ambulatory Visit: Payer: Medicare Other | Admitting: Podiatry

## 2018-02-11 DIAGNOSIS — E1122 Type 2 diabetes mellitus with diabetic chronic kidney disease: Secondary | ICD-10-CM | POA: Diagnosis not present

## 2018-02-11 DIAGNOSIS — Z89421 Acquired absence of other right toe(s): Secondary | ICD-10-CM | POA: Diagnosis not present

## 2018-02-11 DIAGNOSIS — I480 Paroxysmal atrial fibrillation: Secondary | ICD-10-CM | POA: Diagnosis not present

## 2018-02-11 DIAGNOSIS — I132 Hypertensive heart and chronic kidney disease with heart failure and with stage 5 chronic kidney disease, or end stage renal disease: Secondary | ICD-10-CM | POA: Diagnosis not present

## 2018-02-11 DIAGNOSIS — E1151 Type 2 diabetes mellitus with diabetic peripheral angiopathy without gangrene: Secondary | ICD-10-CM | POA: Diagnosis not present

## 2018-02-11 DIAGNOSIS — I509 Heart failure, unspecified: Secondary | ICD-10-CM | POA: Diagnosis not present

## 2018-02-11 DIAGNOSIS — R1312 Dysphagia, oropharyngeal phase: Secondary | ICD-10-CM | POA: Diagnosis not present

## 2018-02-11 DIAGNOSIS — Z992 Dependence on renal dialysis: Secondary | ICD-10-CM | POA: Diagnosis not present

## 2018-02-11 DIAGNOSIS — N186 End stage renal disease: Secondary | ICD-10-CM | POA: Diagnosis not present

## 2018-02-11 DIAGNOSIS — Z794 Long term (current) use of insulin: Secondary | ICD-10-CM | POA: Diagnosis not present

## 2018-02-11 DIAGNOSIS — Z86718 Personal history of other venous thrombosis and embolism: Secondary | ICD-10-CM | POA: Diagnosis not present

## 2018-02-11 DIAGNOSIS — Z48812 Encounter for surgical aftercare following surgery on the circulatory system: Secondary | ICD-10-CM | POA: Diagnosis not present

## 2018-02-11 DIAGNOSIS — D631 Anemia in chronic kidney disease: Secondary | ICD-10-CM | POA: Diagnosis not present

## 2018-02-12 DIAGNOSIS — N186 End stage renal disease: Secondary | ICD-10-CM | POA: Diagnosis not present

## 2018-02-12 DIAGNOSIS — Z23 Encounter for immunization: Secondary | ICD-10-CM | POA: Diagnosis not present

## 2018-02-12 DIAGNOSIS — E1129 Type 2 diabetes mellitus with other diabetic kidney complication: Secondary | ICD-10-CM | POA: Diagnosis not present

## 2018-02-12 DIAGNOSIS — D631 Anemia in chronic kidney disease: Secondary | ICD-10-CM | POA: Diagnosis not present

## 2018-02-12 DIAGNOSIS — N2581 Secondary hyperparathyroidism of renal origin: Secondary | ICD-10-CM | POA: Diagnosis not present

## 2018-02-15 DIAGNOSIS — N186 End stage renal disease: Secondary | ICD-10-CM | POA: Diagnosis not present

## 2018-02-15 DIAGNOSIS — N2581 Secondary hyperparathyroidism of renal origin: Secondary | ICD-10-CM | POA: Diagnosis not present

## 2018-02-15 DIAGNOSIS — E1122 Type 2 diabetes mellitus with diabetic chronic kidney disease: Secondary | ICD-10-CM | POA: Diagnosis not present

## 2018-02-15 DIAGNOSIS — Z48812 Encounter for surgical aftercare following surgery on the circulatory system: Secondary | ICD-10-CM | POA: Diagnosis not present

## 2018-02-15 DIAGNOSIS — E1129 Type 2 diabetes mellitus with other diabetic kidney complication: Secondary | ICD-10-CM | POA: Diagnosis not present

## 2018-02-15 DIAGNOSIS — Z23 Encounter for immunization: Secondary | ICD-10-CM | POA: Diagnosis not present

## 2018-02-15 DIAGNOSIS — I132 Hypertensive heart and chronic kidney disease with heart failure and with stage 5 chronic kidney disease, or end stage renal disease: Secondary | ICD-10-CM | POA: Diagnosis not present

## 2018-02-15 DIAGNOSIS — D631 Anemia in chronic kidney disease: Secondary | ICD-10-CM | POA: Diagnosis not present

## 2018-02-15 DIAGNOSIS — I509 Heart failure, unspecified: Secondary | ICD-10-CM | POA: Diagnosis not present

## 2018-02-16 ENCOUNTER — Encounter: Payer: Self-pay | Admitting: Podiatry

## 2018-02-16 ENCOUNTER — Ambulatory Visit (INDEPENDENT_AMBULATORY_CARE_PROVIDER_SITE_OTHER): Payer: Medicare Other | Admitting: Podiatry

## 2018-02-16 DIAGNOSIS — I5022 Chronic systolic (congestive) heart failure: Secondary | ICD-10-CM | POA: Diagnosis not present

## 2018-02-16 DIAGNOSIS — I739 Peripheral vascular disease, unspecified: Secondary | ICD-10-CM | POA: Diagnosis not present

## 2018-02-16 DIAGNOSIS — E0842 Diabetes mellitus due to underlying condition with diabetic polyneuropathy: Secondary | ICD-10-CM

## 2018-02-16 DIAGNOSIS — N186 End stage renal disease: Secondary | ICD-10-CM | POA: Diagnosis not present

## 2018-02-16 DIAGNOSIS — M79676 Pain in unspecified toe(s): Secondary | ICD-10-CM

## 2018-02-16 DIAGNOSIS — B351 Tinea unguium: Secondary | ICD-10-CM | POA: Diagnosis not present

## 2018-02-16 DIAGNOSIS — E1129 Type 2 diabetes mellitus with other diabetic kidney complication: Secondary | ICD-10-CM | POA: Diagnosis not present

## 2018-02-16 DIAGNOSIS — I313 Pericardial effusion (noninflammatory): Secondary | ICD-10-CM | POA: Diagnosis not present

## 2018-02-16 DIAGNOSIS — I4891 Unspecified atrial fibrillation: Secondary | ICD-10-CM | POA: Diagnosis not present

## 2018-02-16 NOTE — Progress Notes (Signed)
Complaint:  Visit Type: Patient returns to my office for continued preventative foot care services. Complaint: Patient states" my nails have grown long and thick and become painful to walk and wear shoes on his left foot.  Patient has been diagnosed with DM with angiopathy.  Patient has had transmetatarsal amputation performed toes right foot.. The patient presents for preventative foot care services. No changes to ROS  Podiatric Exam: Vascular: dorsalis pedis and posterior tibial pulses are not  palpable bilateral... Capillary return is immediate. Temperature gradient is WNL. Skin turgor WNL  Significant swelling feet  B/L. Sensorium: Normal Semmes Weinstein monofilament test. Normal tactile sensation left foot. Nail Exam: Pt has thick disfigured discolored nails with subungual debris noted  entire nail hallux through fifth toenails left foot. Ulcer Exam: There is no evidence of ulcer or pre-ulcerative changes or infection. Orthopedic Exam: Muscle tone and strength are WNL. No limitations in general ROM. No crepitus or effusions noted. Foot type and digits show no abnormalities. Bony prominences are unremarkable. TMA right foot. Skin: No Porokeratosis. No infection or ulcers  Diagnosis:  Onychomycosis, ,, pain in left toes,  TMA right foot.  Treatment & Plan Procedures and Treatment: Consent by patient was obtained for treatment procedures.   Debridement of mycotic and hypertrophic toenails, 1 through 5 left foot  and clearing of subungual debris. No ulceration, no infection noted.  Return Visit-Office Procedure: Patient instructed to return to the office for a follow up visit 3 months for continued evaluation and treatment.    Gardiner Barefoot DPM

## 2018-02-17 DIAGNOSIS — Z23 Encounter for immunization: Secondary | ICD-10-CM | POA: Diagnosis not present

## 2018-02-17 DIAGNOSIS — D631 Anemia in chronic kidney disease: Secondary | ICD-10-CM | POA: Diagnosis not present

## 2018-02-17 DIAGNOSIS — Z48812 Encounter for surgical aftercare following surgery on the circulatory system: Secondary | ICD-10-CM | POA: Diagnosis not present

## 2018-02-17 DIAGNOSIS — I132 Hypertensive heart and chronic kidney disease with heart failure and with stage 5 chronic kidney disease, or end stage renal disease: Secondary | ICD-10-CM | POA: Diagnosis not present

## 2018-02-17 DIAGNOSIS — N186 End stage renal disease: Secondary | ICD-10-CM | POA: Diagnosis not present

## 2018-02-17 DIAGNOSIS — E1122 Type 2 diabetes mellitus with diabetic chronic kidney disease: Secondary | ICD-10-CM | POA: Diagnosis not present

## 2018-02-17 DIAGNOSIS — I509 Heart failure, unspecified: Secondary | ICD-10-CM | POA: Diagnosis not present

## 2018-02-17 DIAGNOSIS — N2581 Secondary hyperparathyroidism of renal origin: Secondary | ICD-10-CM | POA: Diagnosis not present

## 2018-02-17 DIAGNOSIS — E1129 Type 2 diabetes mellitus with other diabetic kidney complication: Secondary | ICD-10-CM | POA: Diagnosis not present

## 2018-02-18 DIAGNOSIS — E1122 Type 2 diabetes mellitus with diabetic chronic kidney disease: Secondary | ICD-10-CM | POA: Diagnosis not present

## 2018-02-18 DIAGNOSIS — I509 Heart failure, unspecified: Secondary | ICD-10-CM | POA: Diagnosis not present

## 2018-02-18 DIAGNOSIS — I132 Hypertensive heart and chronic kidney disease with heart failure and with stage 5 chronic kidney disease, or end stage renal disease: Secondary | ICD-10-CM | POA: Diagnosis not present

## 2018-02-18 DIAGNOSIS — N186 End stage renal disease: Secondary | ICD-10-CM | POA: Diagnosis not present

## 2018-02-18 DIAGNOSIS — D631 Anemia in chronic kidney disease: Secondary | ICD-10-CM | POA: Diagnosis not present

## 2018-02-18 DIAGNOSIS — Z48812 Encounter for surgical aftercare following surgery on the circulatory system: Secondary | ICD-10-CM | POA: Diagnosis not present

## 2018-02-19 DIAGNOSIS — Z23 Encounter for immunization: Secondary | ICD-10-CM | POA: Diagnosis not present

## 2018-02-19 DIAGNOSIS — E1129 Type 2 diabetes mellitus with other diabetic kidney complication: Secondary | ICD-10-CM | POA: Diagnosis not present

## 2018-02-19 DIAGNOSIS — N2581 Secondary hyperparathyroidism of renal origin: Secondary | ICD-10-CM | POA: Diagnosis not present

## 2018-02-19 DIAGNOSIS — N186 End stage renal disease: Secondary | ICD-10-CM | POA: Diagnosis not present

## 2018-02-19 DIAGNOSIS — D631 Anemia in chronic kidney disease: Secondary | ICD-10-CM | POA: Diagnosis not present

## 2018-02-21 DIAGNOSIS — I509 Heart failure, unspecified: Secondary | ICD-10-CM | POA: Diagnosis not present

## 2018-02-21 DIAGNOSIS — D631 Anemia in chronic kidney disease: Secondary | ICD-10-CM | POA: Diagnosis not present

## 2018-02-21 DIAGNOSIS — Z48812 Encounter for surgical aftercare following surgery on the circulatory system: Secondary | ICD-10-CM | POA: Diagnosis not present

## 2018-02-21 DIAGNOSIS — N186 End stage renal disease: Secondary | ICD-10-CM | POA: Diagnosis not present

## 2018-02-21 DIAGNOSIS — E1122 Type 2 diabetes mellitus with diabetic chronic kidney disease: Secondary | ICD-10-CM | POA: Diagnosis not present

## 2018-02-21 DIAGNOSIS — I132 Hypertensive heart and chronic kidney disease with heart failure and with stage 5 chronic kidney disease, or end stage renal disease: Secondary | ICD-10-CM | POA: Diagnosis not present

## 2018-02-22 DIAGNOSIS — N186 End stage renal disease: Secondary | ICD-10-CM | POA: Diagnosis not present

## 2018-02-22 DIAGNOSIS — N2581 Secondary hyperparathyroidism of renal origin: Secondary | ICD-10-CM | POA: Diagnosis not present

## 2018-02-22 DIAGNOSIS — Z23 Encounter for immunization: Secondary | ICD-10-CM | POA: Diagnosis not present

## 2018-02-22 DIAGNOSIS — D631 Anemia in chronic kidney disease: Secondary | ICD-10-CM | POA: Diagnosis not present

## 2018-02-22 DIAGNOSIS — E1129 Type 2 diabetes mellitus with other diabetic kidney complication: Secondary | ICD-10-CM | POA: Diagnosis not present

## 2018-02-23 DIAGNOSIS — I132 Hypertensive heart and chronic kidney disease with heart failure and with stage 5 chronic kidney disease, or end stage renal disease: Secondary | ICD-10-CM | POA: Diagnosis not present

## 2018-02-23 DIAGNOSIS — Z48812 Encounter for surgical aftercare following surgery on the circulatory system: Secondary | ICD-10-CM | POA: Diagnosis not present

## 2018-02-23 DIAGNOSIS — N186 End stage renal disease: Secondary | ICD-10-CM | POA: Diagnosis not present

## 2018-02-23 DIAGNOSIS — I509 Heart failure, unspecified: Secondary | ICD-10-CM | POA: Diagnosis not present

## 2018-02-23 DIAGNOSIS — D631 Anemia in chronic kidney disease: Secondary | ICD-10-CM | POA: Diagnosis not present

## 2018-02-23 DIAGNOSIS — E1122 Type 2 diabetes mellitus with diabetic chronic kidney disease: Secondary | ICD-10-CM | POA: Diagnosis not present

## 2018-02-24 DIAGNOSIS — E1129 Type 2 diabetes mellitus with other diabetic kidney complication: Secondary | ICD-10-CM | POA: Diagnosis not present

## 2018-02-24 DIAGNOSIS — D631 Anemia in chronic kidney disease: Secondary | ICD-10-CM | POA: Diagnosis not present

## 2018-02-24 DIAGNOSIS — N2581 Secondary hyperparathyroidism of renal origin: Secondary | ICD-10-CM | POA: Diagnosis not present

## 2018-02-24 DIAGNOSIS — Z23 Encounter for immunization: Secondary | ICD-10-CM | POA: Diagnosis not present

## 2018-02-24 DIAGNOSIS — N186 End stage renal disease: Secondary | ICD-10-CM | POA: Diagnosis not present

## 2018-02-25 DIAGNOSIS — I871 Compression of vein: Secondary | ICD-10-CM | POA: Diagnosis not present

## 2018-02-25 DIAGNOSIS — T82858A Stenosis of vascular prosthetic devices, implants and grafts, initial encounter: Secondary | ICD-10-CM | POA: Diagnosis not present

## 2018-02-25 DIAGNOSIS — N186 End stage renal disease: Secondary | ICD-10-CM | POA: Diagnosis not present

## 2018-02-25 DIAGNOSIS — Z992 Dependence on renal dialysis: Secondary | ICD-10-CM | POA: Diagnosis not present

## 2018-02-26 DIAGNOSIS — N186 End stage renal disease: Secondary | ICD-10-CM | POA: Diagnosis not present

## 2018-02-26 DIAGNOSIS — N2581 Secondary hyperparathyroidism of renal origin: Secondary | ICD-10-CM | POA: Diagnosis not present

## 2018-02-26 DIAGNOSIS — Z23 Encounter for immunization: Secondary | ICD-10-CM | POA: Diagnosis not present

## 2018-02-26 DIAGNOSIS — D631 Anemia in chronic kidney disease: Secondary | ICD-10-CM | POA: Diagnosis not present

## 2018-02-26 DIAGNOSIS — E1129 Type 2 diabetes mellitus with other diabetic kidney complication: Secondary | ICD-10-CM | POA: Diagnosis not present

## 2018-02-28 ENCOUNTER — Telehealth: Payer: Self-pay | Admitting: Internal Medicine

## 2018-02-28 DIAGNOSIS — I132 Hypertensive heart and chronic kidney disease with heart failure and with stage 5 chronic kidney disease, or end stage renal disease: Secondary | ICD-10-CM | POA: Diagnosis not present

## 2018-02-28 DIAGNOSIS — I509 Heart failure, unspecified: Secondary | ICD-10-CM | POA: Diagnosis not present

## 2018-02-28 DIAGNOSIS — D631 Anemia in chronic kidney disease: Secondary | ICD-10-CM | POA: Diagnosis not present

## 2018-02-28 DIAGNOSIS — N186 End stage renal disease: Secondary | ICD-10-CM | POA: Diagnosis not present

## 2018-02-28 DIAGNOSIS — E1122 Type 2 diabetes mellitus with diabetic chronic kidney disease: Secondary | ICD-10-CM | POA: Diagnosis not present

## 2018-02-28 DIAGNOSIS — Z48812 Encounter for surgical aftercare following surgery on the circulatory system: Secondary | ICD-10-CM | POA: Diagnosis not present

## 2018-02-28 NOTE — Telephone Encounter (Signed)
New Message:    Starlena with [facility] stated the patient wound is still open after the surgery   Contact:  [place]- [phone]

## 2018-02-28 NOTE — Telephone Encounter (Signed)
Spoke with [employer] @ [facility]. She has been trying to reach the surgeon that performed the patient's pericardial window in August 2019. They are needing orders to address the non-healing surgical wound. Salomon Fick that the sx was performed by Dr.Owen @ [facility]. Provided her with their telephone number. She will contact their office for instruction.

## 2018-03-01 DIAGNOSIS — C8283 Other types of follicular lymphoma, intra-abdominal lymph nodes: Secondary | ICD-10-CM | POA: Diagnosis not present

## 2018-03-01 DIAGNOSIS — D631 Anemia in chronic kidney disease: Secondary | ICD-10-CM | POA: Diagnosis not present

## 2018-03-01 DIAGNOSIS — E1129 Type 2 diabetes mellitus with other diabetic kidney complication: Secondary | ICD-10-CM | POA: Diagnosis not present

## 2018-03-01 DIAGNOSIS — N186 End stage renal disease: Secondary | ICD-10-CM | POA: Diagnosis not present

## 2018-03-01 DIAGNOSIS — N2581 Secondary hyperparathyroidism of renal origin: Secondary | ICD-10-CM | POA: Diagnosis not present

## 2018-03-01 DIAGNOSIS — Z992 Dependence on renal dialysis: Secondary | ICD-10-CM | POA: Diagnosis not present

## 2018-03-03 DIAGNOSIS — N186 End stage renal disease: Secondary | ICD-10-CM | POA: Diagnosis not present

## 2018-03-03 DIAGNOSIS — D631 Anemia in chronic kidney disease: Secondary | ICD-10-CM | POA: Diagnosis not present

## 2018-03-03 DIAGNOSIS — N2581 Secondary hyperparathyroidism of renal origin: Secondary | ICD-10-CM | POA: Diagnosis not present

## 2018-03-03 DIAGNOSIS — E1129 Type 2 diabetes mellitus with other diabetic kidney complication: Secondary | ICD-10-CM | POA: Diagnosis not present

## 2018-03-03 DIAGNOSIS — C8283 Other types of follicular lymphoma, intra-abdominal lymph nodes: Secondary | ICD-10-CM | POA: Diagnosis not present

## 2018-03-04 DIAGNOSIS — Z48812 Encounter for surgical aftercare following surgery on the circulatory system: Secondary | ICD-10-CM | POA: Diagnosis not present

## 2018-03-04 DIAGNOSIS — I509 Heart failure, unspecified: Secondary | ICD-10-CM | POA: Diagnosis not present

## 2018-03-04 DIAGNOSIS — N186 End stage renal disease: Secondary | ICD-10-CM | POA: Diagnosis not present

## 2018-03-04 DIAGNOSIS — D631 Anemia in chronic kidney disease: Secondary | ICD-10-CM | POA: Diagnosis not present

## 2018-03-04 DIAGNOSIS — E1122 Type 2 diabetes mellitus with diabetic chronic kidney disease: Secondary | ICD-10-CM | POA: Diagnosis not present

## 2018-03-04 DIAGNOSIS — I132 Hypertensive heart and chronic kidney disease with heart failure and with stage 5 chronic kidney disease, or end stage renal disease: Secondary | ICD-10-CM | POA: Diagnosis not present

## 2018-03-05 DIAGNOSIS — N2581 Secondary hyperparathyroidism of renal origin: Secondary | ICD-10-CM | POA: Diagnosis not present

## 2018-03-05 DIAGNOSIS — E1129 Type 2 diabetes mellitus with other diabetic kidney complication: Secondary | ICD-10-CM | POA: Diagnosis not present

## 2018-03-05 DIAGNOSIS — N186 End stage renal disease: Secondary | ICD-10-CM | POA: Diagnosis not present

## 2018-03-05 DIAGNOSIS — C8283 Other types of follicular lymphoma, intra-abdominal lymph nodes: Secondary | ICD-10-CM | POA: Diagnosis not present

## 2018-03-05 DIAGNOSIS — D631 Anemia in chronic kidney disease: Secondary | ICD-10-CM | POA: Diagnosis not present

## 2018-03-07 ENCOUNTER — Other Ambulatory Visit: Payer: Self-pay

## 2018-03-07 ENCOUNTER — Ambulatory Visit (INDEPENDENT_AMBULATORY_CARE_PROVIDER_SITE_OTHER): Payer: Medicare Other | Admitting: Surgical

## 2018-03-07 VITALS — BP 143/77 | HR 88 | Resp 16 | Ht 75.5 in | Wt 243.0 lb

## 2018-03-07 DIAGNOSIS — D631 Anemia in chronic kidney disease: Secondary | ICD-10-CM | POA: Diagnosis not present

## 2018-03-07 DIAGNOSIS — I313 Pericardial effusion (noninflammatory): Secondary | ICD-10-CM

## 2018-03-07 DIAGNOSIS — E1122 Type 2 diabetes mellitus with diabetic chronic kidney disease: Secondary | ICD-10-CM | POA: Diagnosis not present

## 2018-03-07 DIAGNOSIS — I132 Hypertensive heart and chronic kidney disease with heart failure and with stage 5 chronic kidney disease, or end stage renal disease: Secondary | ICD-10-CM | POA: Diagnosis not present

## 2018-03-07 DIAGNOSIS — Z48812 Encounter for surgical aftercare following surgery on the circulatory system: Secondary | ICD-10-CM | POA: Diagnosis not present

## 2018-03-07 DIAGNOSIS — I509 Heart failure, unspecified: Secondary | ICD-10-CM | POA: Diagnosis not present

## 2018-03-07 DIAGNOSIS — I3139 Other pericardial effusion (noninflammatory): Secondary | ICD-10-CM

## 2018-03-07 DIAGNOSIS — N186 End stage renal disease: Secondary | ICD-10-CM | POA: Diagnosis not present

## 2018-03-07 DIAGNOSIS — Z09 Encounter for follow-up examination after completed treatment for conditions other than malignant neoplasm: Secondary | ICD-10-CM

## 2018-03-07 NOTE — Progress Notes (Signed)
[address]       [place] [address]             [phone]      [name] Medical Record #[id number] Date of Birth: 28-Aug-1950  Referring: Burnard Bunting, MD Primary Care: Burnard Bunting, MD Primary Cardiologist: Pixie Casino, MD   Chief Complaint:   POST OP FOLLOW UP CARDIOTHORACIC SURGERY OPERATIVE NOTE  Date of Procedure:                            01/10/2018  Preoperative Diagnosis:                  Pericardial Effusion with Pericardial Tamponade  Postoperative Diagnosis:                same  Procedure:                                         [place] Pericardial Window  Surgeon:                                            Valentina Gu. Roxy Manns, MD  Assistant:                                           John Giovanni, PA-C  Anesthesia:                                        Suella Broad, MD  Operative Findings:                          Large, hemorrhagic pericardial effusion with pericardial tamponade  History of Present Illness:    The patient is a 67 year old male status post the above described procedure seen in the office in surgical follow-up on today's date.  He has not been having any significant difficulties related to the surgical incision however the chest tube site does have a superficial separation.  Additionally the chest tube suture had not been removed.  There is no purulence or erythema.  He has not been having fevers or chills.      Past Medical History:  Diagnosis Date  . Anemia   . Arthritis    HNP- lumbar, "all over my body"  . Blood transfusion    "years ago; blood was low" (08/05/2013)  . CKD (chronic kidney disease) stage 4, GFR 15-29 ml/min (HCC) 03/18/2012   [place]- T,TH,Sat.  . Diabetic nephropathy ([place])   . Diabetic retinopathy   . DVT (deep venous thrombosis) ([place])    "got one in my right leg now; I've had one before too, not sure which leg" (08/05/2013)  . ESRD (end  stage renal disease) on dialysis [facility])    "just started today, (08/04/2013)"  . Family history of anesthesia complication    " my son wakes up slowly"  . GERD (gastroesophageal reflux disease)    uses alka seltzere on occas.   Marland Kitchen  Headache(784.0)    "one q now and then" (08/05/2013)  . Hyperlipidemia   . Hypertension   . 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 Non Hodgkin's lymphoma ([place])    Tx 2009; "had chemo; it went away" (08/05/2013)  . Noncompliance 03/16/2012  . NSVT (nonsustained ventricular tachycardia) ([place]) 03/18/2012  . Peripheral vascular disease ([place])   . Pneumonia 2013   hosp.-   . Poor historian    pt. unsure of several answers to health history questions   . Skin cancer    melanoma - head  . Sleep apnea    "suppose to have a sleep study, but they never told me when. (08/05/2013)     Social History   Tobacco Use  Smoking Status Never Smoker  Smokeless Tobacco Never Used    Social History   Substance and Sexual Activity  Alcohol Use No  . Alcohol/week: 0.0 standard drinks     No Known Allergies  Current Outpatient Medications  Medication Sig Dispense Refill  . acetaminophen (TYLENOL) 325 MG tablet Take 650 mg by mouth every 6 (six) hours as needed (pain).    . Cholecalciferol (VITAMIN D3) 50000 units CAPS Take 1 capsule by mouth daily.    . insulin aspart (NOVOLOG) 100 UNIT/ML injection Inject 1-9 Units into the skin 3 (three) times daily before meals.    . insulin glargine (LANTUS) 100 UNIT/ML injection Inject 0.15 mLs (15 Units total) into the skin at bedtime. 10 mL 11  . lidocaine-prilocaine (EMLA) cream Apply 1 application topically as needed (Apply small amount to access site 1-2 hours before dialysis. Cover with occlusive dressing (saran wrap)).     . midodrine (PROAMATINE) 5 MG tablet Take 1 tablet (5 mg total) by mouth 3 (three) times daily with meals.    . MULTIPLE  VITAMINS-MINERALS PO Take by mouth. 1 tablet PO daily    . multivitamin (RENA-VIT) TABS tablet Take 1 tablet by mouth at bedtime. 30 tablet 0  . polysaccharide iron (NIFEREX) 150 MG CAPS capsule Take 1 capsule (150 mg total) by mouth daily. 30 each 2  . sevelamer carbonate (RENVELA) 800 MG tablet Take 1,600 mg by mouth 3 (three) times daily with meals.    . Skin Protectants, Misc. (MINERIN) CREA Apply topically. Topically every 12 hours    . vitamin C (ASCORBIC ACID) 500 MG tablet Take 500 mg by mouth 2 (two) times daily.    Marland Kitchen zinc sulfate 220 (50 Zn) MG capsule Take 220 mg by mouth daily.     No current facility-administered medications for this visit.        Physical Exam: There were no vitals taken for this visit.  General appearance: alert, cooperative and no distress Heart: regular rate and rhythm and no rub Lungs: clear to auscultation bilaterally Extremities: Positive for lower extremity edema Wound: Minimal slight superficial dehiscence of the chest tube site without evidence of infection.   Diagnostic Studies & Laboratory data:     Recent Radiology Findings:   No results found.    Recent Lab Findings: Lab Results  Component Value Date   WBC 14.3 (H) 01/21/2018   HGB 9.3 (L) 01/21/2018   HCT 27.8 (L) 01/21/2018   PLT 179 01/21/2018   GLUCOSE 193 (H) 01/20/2018   CHOL 129 01/14/2011   TRIG 137 01/14/2011   HDL 13 (L) 01/14/2011   LDLCALC 89 01/14/2011   ALT 840 (  H) 01/17/2018   AST 89 (H) 01/17/2018   NA 134 (L) 01/20/2018   K 3.5 01/20/2018   CL 96 (L) 01/20/2018   CREATININE 3.88 (H) 01/20/2018   BUN 22 01/20/2018   CO2 30 01/20/2018   TSH 0.155 (L) 01/14/2011   INR 1.79 01/21/2018   HGBA1C 8.7 (H) 05/03/2015      Assessment / Plan: The patient looks well from a surgical perspective.  Instructed him to keep the chest tube site clean and dry and monitor for any evidence of infection.  Currently it looks to be healing well we will see again in the  office on a as needed basis for any surgically related needs or at request from consulting services.          John Giovanni, PA-C 03/07/2018 12:24 PM  Pager [phone]

## 2018-03-07 NOTE — Patient Instructions (Signed)
Clean chest tube site gently with soap and water and place dry sterile dressing well-healing.

## 2018-03-08 DIAGNOSIS — N186 End stage renal disease: Secondary | ICD-10-CM | POA: Diagnosis not present

## 2018-03-08 DIAGNOSIS — C8283 Other types of follicular lymphoma, intra-abdominal lymph nodes: Secondary | ICD-10-CM | POA: Diagnosis not present

## 2018-03-08 DIAGNOSIS — E1129 Type 2 diabetes mellitus with other diabetic kidney complication: Secondary | ICD-10-CM | POA: Diagnosis not present

## 2018-03-08 DIAGNOSIS — D631 Anemia in chronic kidney disease: Secondary | ICD-10-CM | POA: Diagnosis not present

## 2018-03-08 DIAGNOSIS — N2581 Secondary hyperparathyroidism of renal origin: Secondary | ICD-10-CM | POA: Diagnosis not present

## 2018-03-09 ENCOUNTER — Ambulatory Visit (INDEPENDENT_AMBULATORY_CARE_PROVIDER_SITE_OTHER): Payer: Medicare Other | Admitting: Internal Medicine

## 2018-03-09 ENCOUNTER — Encounter: Payer: Self-pay | Admitting: Internal Medicine

## 2018-03-09 VITALS — BP 179/88 | HR 84 | Ht 75.5 in | Wt 242.6 lb

## 2018-03-09 DIAGNOSIS — Z992 Dependence on renal dialysis: Secondary | ICD-10-CM | POA: Diagnosis not present

## 2018-03-09 DIAGNOSIS — I5022 Chronic systolic (congestive) heart failure: Secondary | ICD-10-CM | POA: Diagnosis not present

## 2018-03-09 DIAGNOSIS — I959 Hypotension, unspecified: Secondary | ICD-10-CM

## 2018-03-09 DIAGNOSIS — N186 End stage renal disease: Secondary | ICD-10-CM | POA: Diagnosis not present

## 2018-03-09 DIAGNOSIS — I4892 Unspecified atrial flutter: Secondary | ICD-10-CM

## 2018-03-09 NOTE — Patient Instructions (Signed)
Medication Instructions:  DECREASE midodrine to take ONLY ON DIALYSIS DAYS  If you need a refill on your cardiac medications before your next appointment, please call your pharmacy.    Follow-Up: At [facility], you and your health needs are our priority.  As part of our continuing mission to provide you with exceptional heart care, we have created designated Provider Care Teams.  These Care Teams include your primary Cardiologist (physician) and Advanced Practice Providers (APPs -  Physician Assistants and Nurse Practitioners) who all work together to provide you with the care you need, when you need it. You will need a follow up appointment in 6 months.  Please call our office 2 months in advance to schedule this appointment.  You may see Pixie Casino, MD or one of the following Advanced Practice Providers on your designated Care Team: [place], [place] . Fabian Sharp, PA-C  Any Other Special Instructions Will Be Listed Below (If Applicable).

## 2018-03-09 NOTE — Progress Notes (Signed)
OFFICE NOTE  Chief Complaint:  Hospital follow-up  Primary Care Physician: Burnard Bunting, MD  HPI:  [name] is a 67 y.o. male with a past medial history significant for end-stage renal disease on dialysis, diabetic nephropathy and retinopathy, history of DVT, and recent admission for diffuse abdominal pain and hypotension, subsequently found to have a large pericardial effusion.  Was also treated for presumptive septic shock on broad-spectrum antibiotics and required long-term intubation and ICU stay.  Based on his pericardial effusion and signs and symptoms of tamponade physiology, he was taken urgently to the operating room and underwent pericardial window.  This demonstrated a large, hemorrhagic pericardial effusion with tamponade.  Subsequently he had dramatic improvements in his hemodynamics and ultimately was able to be weaned off of pressors and extubated.  He also struggled with atrial fibrillation.  This is a long-standing issue and he was seen in the past by Dr. Curt Bears in 2018.  [name] returns today in follow-up.  Remarkably seems to be doing well.  He underwent a long stent of rehabilitation but is able to walk now, albeit slowly with a walker.  He was seen in follow-up by Doreene Adas, PA-C, who did not recommend restarting anticoagulation due to his hemorrhagic pericardial effusion.  Although, he has had pericardial window therefore he is risk of recurrent tamponade is low.  He is in persistent atrial flutter today, which would be a greater risk factor for stroke.  Finally he is struggled with hypotension on dialysis days.  He has been taking Midrin 5 mg 3 times daily every day, not just on dialysis days.  Today is a nondialysis day and his blood pressure was elevated 179/88.  PMHx:  Past Medical History:  Diagnosis Date  . Anemia   . Arthritis    HNP- lumbar, "all over my body"  . Blood transfusion    "years ago; blood was low" (08/05/2013)  . CKD (chronic kidney  disease) stage 4, GFR 15-29 ml/min (HCC) 03/18/2012   Jette- T,TH,Sat.  . Diabetic nephropathy ([place])   . Diabetic retinopathy   . DVT (deep venous thrombosis) ([place])    "got one in my right leg now; I've had one before too, not sure which leg" (08/05/2013)  . ESRD (end stage renal disease) on dialysis [facility])    "just started today, (08/04/2013)"  . Family history of anesthesia complication    " my son wakes up slowly"  . GERD (gastroesophageal reflux disease)    uses alka seltzere on occas.   [name])    "one q now and then" (08/05/2013)  . Hyperlipidemia   . Hypertension   . 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 Non Hodgkin's lymphoma ([place])    Tx 2009; "had chemo; it went away" (08/05/2013)  . Noncompliance 03/16/2012  . NSVT (nonsustained ventricular tachycardia) ([place]) 03/18/2012  . Peripheral vascular disease ([place])   . Pneumonia 2013   hosp.-   . Poor historian    pt. unsure of several answers to health history questions   . Skin cancer    melanoma - head  . Sleep apnea    "suppose to have a sleep study, but they never told me when. (08/05/2013)    Past Surgical History:  Procedure Laterality Date  . ACHILLES TENDON SURGERY Right 03/13/2016   Procedure: ACHILLES LENGTHENING/KIDNER;  Surgeon: Edrick Kins, DPM;  Location: [facility]  OR;  Service: Podiatry;  Laterality: Right;  . AV FISTULA PLACEMENT Left 02/03/2013   Procedure: ARTERIOVENOUS (AV) FISTULA CREATION- LEFT RADIAL CEPHALIC; ULTRASOUND GUIDED;  Surgeon: Mal Misty, MD;  Location: [facility] OR;  Service: Vascular;  Laterality: Left;  . AV FISTULA PLACEMENT Right 11/08/2015   Procedure: RIGHT BRACHIOCEPHALIC ARTERIOVENOUS (AV) FISTULA CREATION;  Surgeon: Serafina Mitchell, MD;  Location: [place];  Service: Vascular;  Laterality: Right;  . BASCILIC VEIN TRANSPOSITION Right 01/24/2016   Procedure: RIGHT SECOND STAGE BASILIC VEIN  TRANSPOSITION;  Surgeon: Angelia Mould, MD;  Location: [place];  Service: Vascular;  Laterality: Right;  . CARDIAC CATHETERIZATION    . COLONOSCOPY N/A 09/23/2015   Procedure: COLONOSCOPY;  Surgeon: Irene Shipper, MD;  Location: [facility];  Service: Endoscopy;  Laterality: N/A;  . Coloscopy    . EYE SURGERY Bilateral   . GAS INSERTION  05/10/2012   Procedure: INSERTION OF GAS;  Surgeon: Hayden Pedro, MD;  Location: [place];  Service: Ophthalmology;  Laterality: Right;  . LEFT HEART CATH AND CORONARY ANGIOGRAPHY N/A 09/11/2016   Procedure: Left Heart Cath and Coronary Angiography;  Surgeon: Belva Crome, MD;  Location: [place] [facility];  Service: Cardiovascular;  Laterality: N/A;  . LESION EXCISION Right 05/08/2015   Procedure: EXCISION SCALP LESION;  Surgeon: Erroll Luna, MD;  Location: [place];  Service: General;  Laterality: Right;  . MEMBRANE PEEL  05/10/2012   Procedure: MEMBRANE PEEL;  Surgeon: Hayden Pedro, MD;  Location: [place];  Service: Ophthalmology;  Laterality: Right;  . PARS PLANA VITRECTOMY  08/27/2011   Procedure: PARS PLANA VITRECTOMY WITH 25 GAUGE;  Surgeon: Hayden Pedro, MD;  Location: [place];  Service: Ophthalmology;  Laterality: Left;  Repair of complex traction retinal detachment left eye  . PARS PLANA VITRECTOMY  05/10/2012   Procedure: PARS PLANA VITRECTOMY WITH 25 GAUGE;  Surgeon: Hayden Pedro, MD;  Location: [place];  Service: Ophthalmology;  Laterality: Right;  Repair Complex Traction Retinal Detachment  . PHOTOCOAGULATION WITH LASER  05/10/2012   Procedure: PHOTOCOAGULATION WITH LASER;  Surgeon: Hayden Pedro, MD;  Location: [place];  Service: Ophthalmology;  Laterality: Right;  . PORT-A-CATH REMOVAL    . PORTACATH PLACEMENT    . SUBXYPHOID PERICARDIAL WINDOW N/A 01/10/2018   Procedure: SUBXYPHOID PERICARDIAL WINDOW;  Surgeon: Rexene Alberts, MD;  Location: [name];  Service: Thoracic;  Laterality: N/A;  . TRANSMETATARSAL AMPUTATION Right 03/13/2016    Procedure: TRANSMETATARSAL AMPUTATION;  Surgeon: Edrick Kins, DPM;  Location: [place];  Service: Podiatry;  Laterality: Right;  . VENA CAVA FILTER PLACEMENT  09/2012   due to preparation for surgery    FAMHx:  Family History  Problem Relation Age of [place]  . Anesthesia problems Son   . Hypertension Son   . Diabetes Father   . Hypertension Father   . Other Father        amputation  . Colon cancer Neg Hx     SOCHx:   reports that he has never smoked. He has never used smokeless tobacco. He reports that he does not drink alcohol or use drugs.  ALLERGIES:  No Known Allergies  ROS: Pertinent items noted in HPI and remainder of comprehensive ROS otherwise negative.  HOME MEDS: Current Outpatient Medications on File Prior to Visit  Medication Sig Dispense Refill  . acetaminophen (TYLENOL) 325 MG tablet Take 650 mg by mouth every 6 (six) hours as needed (pain).    Marland Kitchen  Cholecalciferol (VITAMIN D3) 50000 units CAPS Take 1 capsule by mouth daily.    . insulin aspart (NOVOLOG) 100 UNIT/ML injection Inject 1-9 Units into the skin 3 (three) times daily before meals.    . insulin glargine (LANTUS) 100 UNIT/ML injection Inject 0.15 mLs (15 Units total) into the skin at bedtime. 10 mL 11  . lidocaine-prilocaine (EMLA) cream Apply 1 application topically as needed (Apply small amount to access site 1-2 hours before dialysis. Cover with occlusive dressing (saran wrap)).     . midodrine (PROAMATINE) 5 MG tablet Take 5 mg by mouth 3 (three) times daily with meals. Take ONLY on DIALYSIS DAYS    . MULTIPLE VITAMINS-MINERALS PO Take by mouth. 1 tablet PO daily    . multivitamin (RENA-VIT) TABS tablet Take 1 tablet by mouth at bedtime. 30 tablet 0  . polysaccharide iron (NIFEREX) 150 MG CAPS capsule Take 1 capsule (150 mg total) by mouth daily. 30 each 2  . sevelamer carbonate (RENVELA) 800 MG tablet Take 1,600 mg by mouth 3 (three) times daily with meals.    . Skin Protectants, Misc. (MINERIN) CREA Apply  topically. Topically every 12 hours    . vitamin C (ASCORBIC ACID) 500 MG tablet Take 500 mg by mouth 2 (two) times daily.    Marland Kitchen zinc sulfate 220 (50 Zn) MG capsule Take 220 mg by mouth daily.     No current facility-administered medications on file prior to visit.     LABS/IMAGING: No results found for this or any previous visit (from the past 48 hour(s)). No results found.  LIPID PANEL:    Component Value Date/Time   CHOL 129 01/14/2011 0437   TRIG 137 01/14/2011 0437   HDL 13 (L) 01/14/2011 0437   CHOLHDL 9.9 01/14/2011 0437   VLDL 27 01/14/2011 0437   LDLCALC 89 01/14/2011 0437     WEIGHTS: Wt Readings from Last 3 Encounters:  03/09/18 242 lb 9.6 oz (110 kg)  03/07/18 243 lb (110.2 kg)  02/02/18 243 lb (110.2 kg)    VITALS: BP (!) 179/88   Pulse 84   Ht 6' 3.5" (1.918 m)   Wt 242 lb 9.6 oz (110 kg)   BMI 29.92 kg/m   EXAM: General appearance: alert and no distress Neck: no carotid bruit, no JVD and thyroid not enlarged, symmetric, no tenderness/mass/nodules Lungs: clear to auscultation bilaterally Heart: irregularly irregular rhythm Abdomen: soft, non-tender; bowel sounds normal; no masses,  no organomegaly Extremities: extremities normal, atraumatic, no cyanosis or edema Pulses: 2+ and symmetric Skin: Skin color, texture, turgor normal. No rashes or lesions Neurologic: Grossly normal Psych: Pleasant  EKG: Atrial flutter with variable AV response, RBBB, LAFB, LVH with repolarization abnormality- personally reviewed  ASSESSMENT: 1. PAF/flutter 2. Recent tamponade with hemorrhagic effusion-status post pericardial window 3. End-stage renal disease on hemodialysis 4. Insulin-dependent diabetes with endorgan effects 5. Hypertension 6. Dyslipidemia  PLAN: 1.   Mr. Yusko remarkably was able to survive this recent hospitalization.  He remains in atrial flutter or fibrillation with controlled ventricular response.  He is not anticoagulated due to hemorrhagic  pericardial effusion.  He is status post window, and this raises the question of whether or not he could potentially go back on warfarin.  I would like to address this with his nephrologist and would appreciate his recommendations.  I am leaning towards recommending restarting warfarin.  Also, he is noted to be hypertensive today.  He is on midodrine which he takes on a daily basis.  I  did advise him to take this only on dialysis days and monitor blood pressure on his nondialysis days.  Plan follow-up with me in 6 months.  Pixie Casino, MD, [facility], [place] Director of the [facility] Lipid Disorders &  Cardiovascular Risk Reduction [facility] Diplomate of the American Board of Clinical Lipidology Attending Cardiologist  Direct Dial: [phone]  Fax: [phone]  Website:  [email or web address] 03/09/2018, 4:56 PM

## 2018-03-10 DIAGNOSIS — N186 End stage renal disease: Secondary | ICD-10-CM | POA: Diagnosis not present

## 2018-03-10 DIAGNOSIS — D631 Anemia in chronic kidney disease: Secondary | ICD-10-CM | POA: Diagnosis not present

## 2018-03-10 DIAGNOSIS — C8283 Other types of follicular lymphoma, intra-abdominal lymph nodes: Secondary | ICD-10-CM | POA: Diagnosis not present

## 2018-03-10 DIAGNOSIS — N2581 Secondary hyperparathyroidism of renal origin: Secondary | ICD-10-CM | POA: Diagnosis not present

## 2018-03-10 DIAGNOSIS — E1129 Type 2 diabetes mellitus with other diabetic kidney complication: Secondary | ICD-10-CM | POA: Diagnosis not present

## 2018-03-11 DIAGNOSIS — D631 Anemia in chronic kidney disease: Secondary | ICD-10-CM | POA: Diagnosis not present

## 2018-03-11 DIAGNOSIS — E1122 Type 2 diabetes mellitus with diabetic chronic kidney disease: Secondary | ICD-10-CM | POA: Diagnosis not present

## 2018-03-11 DIAGNOSIS — N186 End stage renal disease: Secondary | ICD-10-CM | POA: Diagnosis not present

## 2018-03-11 DIAGNOSIS — I132 Hypertensive heart and chronic kidney disease with heart failure and with stage 5 chronic kidney disease, or end stage renal disease: Secondary | ICD-10-CM | POA: Diagnosis not present

## 2018-03-11 DIAGNOSIS — I509 Heart failure, unspecified: Secondary | ICD-10-CM | POA: Diagnosis not present

## 2018-03-11 DIAGNOSIS — Z48812 Encounter for surgical aftercare following surgery on the circulatory system: Secondary | ICD-10-CM | POA: Diagnosis not present

## 2018-03-12 DIAGNOSIS — N186 End stage renal disease: Secondary | ICD-10-CM | POA: Diagnosis not present

## 2018-03-12 DIAGNOSIS — E1129 Type 2 diabetes mellitus with other diabetic kidney complication: Secondary | ICD-10-CM | POA: Diagnosis not present

## 2018-03-12 DIAGNOSIS — C8283 Other types of follicular lymphoma, intra-abdominal lymph nodes: Secondary | ICD-10-CM | POA: Diagnosis not present

## 2018-03-12 DIAGNOSIS — D631 Anemia in chronic kidney disease: Secondary | ICD-10-CM | POA: Diagnosis not present

## 2018-03-12 DIAGNOSIS — N2581 Secondary hyperparathyroidism of renal origin: Secondary | ICD-10-CM | POA: Diagnosis not present

## 2018-03-14 DIAGNOSIS — E1122 Type 2 diabetes mellitus with diabetic chronic kidney disease: Secondary | ICD-10-CM | POA: Diagnosis not present

## 2018-03-14 DIAGNOSIS — I509 Heart failure, unspecified: Secondary | ICD-10-CM | POA: Diagnosis not present

## 2018-03-14 DIAGNOSIS — D631 Anemia in chronic kidney disease: Secondary | ICD-10-CM | POA: Diagnosis not present

## 2018-03-14 DIAGNOSIS — I132 Hypertensive heart and chronic kidney disease with heart failure and with stage 5 chronic kidney disease, or end stage renal disease: Secondary | ICD-10-CM | POA: Diagnosis not present

## 2018-03-14 DIAGNOSIS — Z48812 Encounter for surgical aftercare following surgery on the circulatory system: Secondary | ICD-10-CM | POA: Diagnosis not present

## 2018-03-14 DIAGNOSIS — N186 End stage renal disease: Secondary | ICD-10-CM | POA: Diagnosis not present

## 2018-03-15 DIAGNOSIS — N186 End stage renal disease: Secondary | ICD-10-CM | POA: Diagnosis not present

## 2018-03-15 DIAGNOSIS — E1129 Type 2 diabetes mellitus with other diabetic kidney complication: Secondary | ICD-10-CM | POA: Diagnosis not present

## 2018-03-15 DIAGNOSIS — D631 Anemia in chronic kidney disease: Secondary | ICD-10-CM | POA: Diagnosis not present

## 2018-03-15 DIAGNOSIS — C8283 Other types of follicular lymphoma, intra-abdominal lymph nodes: Secondary | ICD-10-CM | POA: Diagnosis not present

## 2018-03-15 DIAGNOSIS — N2581 Secondary hyperparathyroidism of renal origin: Secondary | ICD-10-CM | POA: Diagnosis not present

## 2018-03-17 DIAGNOSIS — D631 Anemia in chronic kidney disease: Secondary | ICD-10-CM | POA: Diagnosis not present

## 2018-03-17 DIAGNOSIS — N2581 Secondary hyperparathyroidism of renal origin: Secondary | ICD-10-CM | POA: Diagnosis not present

## 2018-03-17 DIAGNOSIS — C8283 Other types of follicular lymphoma, intra-abdominal lymph nodes: Secondary | ICD-10-CM | POA: Diagnosis not present

## 2018-03-17 DIAGNOSIS — E1129 Type 2 diabetes mellitus with other diabetic kidney complication: Secondary | ICD-10-CM | POA: Diagnosis not present

## 2018-03-17 DIAGNOSIS — N186 End stage renal disease: Secondary | ICD-10-CM | POA: Diagnosis not present

## 2018-03-18 DIAGNOSIS — I132 Hypertensive heart and chronic kidney disease with heart failure and with stage 5 chronic kidney disease, or end stage renal disease: Secondary | ICD-10-CM | POA: Diagnosis not present

## 2018-03-18 DIAGNOSIS — I509 Heart failure, unspecified: Secondary | ICD-10-CM | POA: Diagnosis not present

## 2018-03-18 DIAGNOSIS — N186 End stage renal disease: Secondary | ICD-10-CM | POA: Diagnosis not present

## 2018-03-18 DIAGNOSIS — E1122 Type 2 diabetes mellitus with diabetic chronic kidney disease: Secondary | ICD-10-CM | POA: Diagnosis not present

## 2018-03-18 DIAGNOSIS — Z48812 Encounter for surgical aftercare following surgery on the circulatory system: Secondary | ICD-10-CM | POA: Diagnosis not present

## 2018-03-18 DIAGNOSIS — D631 Anemia in chronic kidney disease: Secondary | ICD-10-CM | POA: Diagnosis not present

## 2018-03-19 DIAGNOSIS — E1129 Type 2 diabetes mellitus with other diabetic kidney complication: Secondary | ICD-10-CM | POA: Diagnosis not present

## 2018-03-19 DIAGNOSIS — C8283 Other types of follicular lymphoma, intra-abdominal lymph nodes: Secondary | ICD-10-CM | POA: Diagnosis not present

## 2018-03-19 DIAGNOSIS — N2581 Secondary hyperparathyroidism of renal origin: Secondary | ICD-10-CM | POA: Diagnosis not present

## 2018-03-19 DIAGNOSIS — N186 End stage renal disease: Secondary | ICD-10-CM | POA: Diagnosis not present

## 2018-03-19 DIAGNOSIS — D631 Anemia in chronic kidney disease: Secondary | ICD-10-CM | POA: Diagnosis not present

## 2018-03-21 DIAGNOSIS — Z7901 Long term (current) use of anticoagulants: Secondary | ICD-10-CM | POA: Diagnosis not present

## 2018-03-21 DIAGNOSIS — I4891 Unspecified atrial fibrillation: Secondary | ICD-10-CM | POA: Diagnosis not present

## 2018-03-22 DIAGNOSIS — E1129 Type 2 diabetes mellitus with other diabetic kidney complication: Secondary | ICD-10-CM | POA: Diagnosis not present

## 2018-03-22 DIAGNOSIS — N2581 Secondary hyperparathyroidism of renal origin: Secondary | ICD-10-CM | POA: Diagnosis not present

## 2018-03-22 DIAGNOSIS — D631 Anemia in chronic kidney disease: Secondary | ICD-10-CM | POA: Diagnosis not present

## 2018-03-22 DIAGNOSIS — C8283 Other types of follicular lymphoma, intra-abdominal lymph nodes: Secondary | ICD-10-CM | POA: Diagnosis not present

## 2018-03-22 DIAGNOSIS — N186 End stage renal disease: Secondary | ICD-10-CM | POA: Diagnosis not present

## 2018-03-23 DIAGNOSIS — I509 Heart failure, unspecified: Secondary | ICD-10-CM | POA: Diagnosis not present

## 2018-03-23 DIAGNOSIS — E1122 Type 2 diabetes mellitus with diabetic chronic kidney disease: Secondary | ICD-10-CM | POA: Diagnosis not present

## 2018-03-23 DIAGNOSIS — Z48812 Encounter for surgical aftercare following surgery on the circulatory system: Secondary | ICD-10-CM | POA: Diagnosis not present

## 2018-03-23 DIAGNOSIS — D631 Anemia in chronic kidney disease: Secondary | ICD-10-CM | POA: Diagnosis not present

## 2018-03-23 DIAGNOSIS — N186 End stage renal disease: Secondary | ICD-10-CM | POA: Diagnosis not present

## 2018-03-23 DIAGNOSIS — I132 Hypertensive heart and chronic kidney disease with heart failure and with stage 5 chronic kidney disease, or end stage renal disease: Secondary | ICD-10-CM | POA: Diagnosis not present

## 2018-03-24 DIAGNOSIS — N186 End stage renal disease: Secondary | ICD-10-CM | POA: Diagnosis not present

## 2018-03-24 DIAGNOSIS — C8283 Other types of follicular lymphoma, intra-abdominal lymph nodes: Secondary | ICD-10-CM | POA: Diagnosis not present

## 2018-03-24 DIAGNOSIS — D631 Anemia in chronic kidney disease: Secondary | ICD-10-CM | POA: Diagnosis not present

## 2018-03-24 DIAGNOSIS — E1129 Type 2 diabetes mellitus with other diabetic kidney complication: Secondary | ICD-10-CM | POA: Diagnosis not present

## 2018-03-24 DIAGNOSIS — N2581 Secondary hyperparathyroidism of renal origin: Secondary | ICD-10-CM | POA: Diagnosis not present

## 2018-03-25 DIAGNOSIS — I509 Heart failure, unspecified: Secondary | ICD-10-CM | POA: Diagnosis not present

## 2018-03-25 DIAGNOSIS — E1122 Type 2 diabetes mellitus with diabetic chronic kidney disease: Secondary | ICD-10-CM | POA: Diagnosis not present

## 2018-03-25 DIAGNOSIS — N186 End stage renal disease: Secondary | ICD-10-CM | POA: Diagnosis not present

## 2018-03-25 DIAGNOSIS — Z48812 Encounter for surgical aftercare following surgery on the circulatory system: Secondary | ICD-10-CM | POA: Diagnosis not present

## 2018-03-25 DIAGNOSIS — I132 Hypertensive heart and chronic kidney disease with heart failure and with stage 5 chronic kidney disease, or end stage renal disease: Secondary | ICD-10-CM | POA: Diagnosis not present

## 2018-03-25 DIAGNOSIS — D631 Anemia in chronic kidney disease: Secondary | ICD-10-CM | POA: Diagnosis not present

## 2018-03-26 DIAGNOSIS — N186 End stage renal disease: Secondary | ICD-10-CM | POA: Diagnosis not present

## 2018-03-26 DIAGNOSIS — N2581 Secondary hyperparathyroidism of renal origin: Secondary | ICD-10-CM | POA: Diagnosis not present

## 2018-03-26 DIAGNOSIS — C8283 Other types of follicular lymphoma, intra-abdominal lymph nodes: Secondary | ICD-10-CM | POA: Diagnosis not present

## 2018-03-26 DIAGNOSIS — E1129 Type 2 diabetes mellitus with other diabetic kidney complication: Secondary | ICD-10-CM | POA: Diagnosis not present

## 2018-03-26 DIAGNOSIS — D631 Anemia in chronic kidney disease: Secondary | ICD-10-CM | POA: Diagnosis not present

## 2018-03-29 DIAGNOSIS — E1129 Type 2 diabetes mellitus with other diabetic kidney complication: Secondary | ICD-10-CM | POA: Diagnosis not present

## 2018-03-29 DIAGNOSIS — C8283 Other types of follicular lymphoma, intra-abdominal lymph nodes: Secondary | ICD-10-CM | POA: Diagnosis not present

## 2018-03-29 DIAGNOSIS — N186 End stage renal disease: Secondary | ICD-10-CM | POA: Diagnosis not present

## 2018-03-29 DIAGNOSIS — N2581 Secondary hyperparathyroidism of renal origin: Secondary | ICD-10-CM | POA: Diagnosis not present

## 2018-03-29 DIAGNOSIS — D631 Anemia in chronic kidney disease: Secondary | ICD-10-CM | POA: Diagnosis not present

## 2018-03-30 DIAGNOSIS — Z7901 Long term (current) use of anticoagulants: Secondary | ICD-10-CM | POA: Diagnosis not present

## 2018-03-30 DIAGNOSIS — I509 Heart failure, unspecified: Secondary | ICD-10-CM | POA: Diagnosis not present

## 2018-03-30 DIAGNOSIS — N186 End stage renal disease: Secondary | ICD-10-CM | POA: Diagnosis not present

## 2018-03-30 DIAGNOSIS — I4891 Unspecified atrial fibrillation: Secondary | ICD-10-CM | POA: Diagnosis not present

## 2018-03-30 DIAGNOSIS — E1122 Type 2 diabetes mellitus with diabetic chronic kidney disease: Secondary | ICD-10-CM | POA: Diagnosis not present

## 2018-03-30 DIAGNOSIS — I132 Hypertensive heart and chronic kidney disease with heart failure and with stage 5 chronic kidney disease, or end stage renal disease: Secondary | ICD-10-CM | POA: Diagnosis not present

## 2018-03-30 DIAGNOSIS — Z48812 Encounter for surgical aftercare following surgery on the circulatory system: Secondary | ICD-10-CM | POA: Diagnosis not present

## 2018-03-30 DIAGNOSIS — D631 Anemia in chronic kidney disease: Secondary | ICD-10-CM | POA: Diagnosis not present

## 2018-03-31 DIAGNOSIS — N186 End stage renal disease: Secondary | ICD-10-CM | POA: Diagnosis not present

## 2018-03-31 DIAGNOSIS — E1129 Type 2 diabetes mellitus with other diabetic kidney complication: Secondary | ICD-10-CM | POA: Diagnosis not present

## 2018-03-31 DIAGNOSIS — N2581 Secondary hyperparathyroidism of renal origin: Secondary | ICD-10-CM | POA: Diagnosis not present

## 2018-03-31 DIAGNOSIS — C8283 Other types of follicular lymphoma, intra-abdominal lymph nodes: Secondary | ICD-10-CM | POA: Diagnosis not present

## 2018-03-31 DIAGNOSIS — D631 Anemia in chronic kidney disease: Secondary | ICD-10-CM | POA: Diagnosis not present

## 2018-04-01 DIAGNOSIS — I509 Heart failure, unspecified: Secondary | ICD-10-CM | POA: Diagnosis not present

## 2018-04-01 DIAGNOSIS — I132 Hypertensive heart and chronic kidney disease with heart failure and with stage 5 chronic kidney disease, or end stage renal disease: Secondary | ICD-10-CM | POA: Diagnosis not present

## 2018-04-01 DIAGNOSIS — Z48812 Encounter for surgical aftercare following surgery on the circulatory system: Secondary | ICD-10-CM | POA: Diagnosis not present

## 2018-04-01 DIAGNOSIS — E1129 Type 2 diabetes mellitus with other diabetic kidney complication: Secondary | ICD-10-CM | POA: Diagnosis not present

## 2018-04-01 DIAGNOSIS — Z992 Dependence on renal dialysis: Secondary | ICD-10-CM | POA: Diagnosis not present

## 2018-04-01 DIAGNOSIS — N2581 Secondary hyperparathyroidism of renal origin: Secondary | ICD-10-CM | POA: Diagnosis not present

## 2018-04-01 DIAGNOSIS — E1122 Type 2 diabetes mellitus with diabetic chronic kidney disease: Secondary | ICD-10-CM | POA: Diagnosis not present

## 2018-04-01 DIAGNOSIS — N186 End stage renal disease: Secondary | ICD-10-CM | POA: Diagnosis not present

## 2018-04-01 DIAGNOSIS — D631 Anemia in chronic kidney disease: Secondary | ICD-10-CM | POA: Diagnosis not present

## 2018-04-02 DIAGNOSIS — D631 Anemia in chronic kidney disease: Secondary | ICD-10-CM | POA: Diagnosis not present

## 2018-04-02 DIAGNOSIS — N186 End stage renal disease: Secondary | ICD-10-CM | POA: Diagnosis not present

## 2018-04-02 DIAGNOSIS — E1129 Type 2 diabetes mellitus with other diabetic kidney complication: Secondary | ICD-10-CM | POA: Diagnosis not present

## 2018-04-02 DIAGNOSIS — N2581 Secondary hyperparathyroidism of renal origin: Secondary | ICD-10-CM | POA: Diagnosis not present

## 2018-04-05 DIAGNOSIS — N186 End stage renal disease: Secondary | ICD-10-CM | POA: Diagnosis not present

## 2018-04-05 DIAGNOSIS — E1129 Type 2 diabetes mellitus with other diabetic kidney complication: Secondary | ICD-10-CM | POA: Diagnosis not present

## 2018-04-05 DIAGNOSIS — N2581 Secondary hyperparathyroidism of renal origin: Secondary | ICD-10-CM | POA: Diagnosis not present

## 2018-04-05 DIAGNOSIS — D631 Anemia in chronic kidney disease: Secondary | ICD-10-CM | POA: Diagnosis not present

## 2018-04-07 DIAGNOSIS — E1129 Type 2 diabetes mellitus with other diabetic kidney complication: Secondary | ICD-10-CM | POA: Diagnosis not present

## 2018-04-07 DIAGNOSIS — N2581 Secondary hyperparathyroidism of renal origin: Secondary | ICD-10-CM | POA: Diagnosis not present

## 2018-04-07 DIAGNOSIS — D631 Anemia in chronic kidney disease: Secondary | ICD-10-CM | POA: Diagnosis not present

## 2018-04-07 DIAGNOSIS — N186 End stage renal disease: Secondary | ICD-10-CM | POA: Diagnosis not present

## 2018-04-08 DIAGNOSIS — I132 Hypertensive heart and chronic kidney disease with heart failure and with stage 5 chronic kidney disease, or end stage renal disease: Secondary | ICD-10-CM | POA: Diagnosis not present

## 2018-04-08 DIAGNOSIS — D631 Anemia in chronic kidney disease: Secondary | ICD-10-CM | POA: Diagnosis not present

## 2018-04-08 DIAGNOSIS — E1122 Type 2 diabetes mellitus with diabetic chronic kidney disease: Secondary | ICD-10-CM | POA: Diagnosis not present

## 2018-04-08 DIAGNOSIS — I509 Heart failure, unspecified: Secondary | ICD-10-CM | POA: Diagnosis not present

## 2018-04-08 DIAGNOSIS — N186 End stage renal disease: Secondary | ICD-10-CM | POA: Diagnosis not present

## 2018-04-08 DIAGNOSIS — Z48812 Encounter for surgical aftercare following surgery on the circulatory system: Secondary | ICD-10-CM | POA: Diagnosis not present

## 2018-04-09 DIAGNOSIS — D631 Anemia in chronic kidney disease: Secondary | ICD-10-CM | POA: Diagnosis not present

## 2018-04-09 DIAGNOSIS — E1129 Type 2 diabetes mellitus with other diabetic kidney complication: Secondary | ICD-10-CM | POA: Diagnosis not present

## 2018-04-09 DIAGNOSIS — N186 End stage renal disease: Secondary | ICD-10-CM | POA: Diagnosis not present

## 2018-04-09 DIAGNOSIS — N2581 Secondary hyperparathyroidism of renal origin: Secondary | ICD-10-CM | POA: Diagnosis not present

## 2018-04-12 DIAGNOSIS — N2581 Secondary hyperparathyroidism of renal origin: Secondary | ICD-10-CM | POA: Diagnosis not present

## 2018-04-12 DIAGNOSIS — N186 End stage renal disease: Secondary | ICD-10-CM | POA: Diagnosis not present

## 2018-04-12 DIAGNOSIS — E1129 Type 2 diabetes mellitus with other diabetic kidney complication: Secondary | ICD-10-CM | POA: Diagnosis not present

## 2018-04-12 DIAGNOSIS — D631 Anemia in chronic kidney disease: Secondary | ICD-10-CM | POA: Diagnosis not present

## 2018-04-13 DIAGNOSIS — I4891 Unspecified atrial fibrillation: Secondary | ICD-10-CM | POA: Diagnosis not present

## 2018-04-13 DIAGNOSIS — Z7901 Long term (current) use of anticoagulants: Secondary | ICD-10-CM | POA: Diagnosis not present

## 2018-04-14 DIAGNOSIS — E1129 Type 2 diabetes mellitus with other diabetic kidney complication: Secondary | ICD-10-CM | POA: Diagnosis not present

## 2018-04-14 DIAGNOSIS — D631 Anemia in chronic kidney disease: Secondary | ICD-10-CM | POA: Diagnosis not present

## 2018-04-14 DIAGNOSIS — N2581 Secondary hyperparathyroidism of renal origin: Secondary | ICD-10-CM | POA: Diagnosis not present

## 2018-04-14 DIAGNOSIS — N186 End stage renal disease: Secondary | ICD-10-CM | POA: Diagnosis not present

## 2018-04-16 DIAGNOSIS — E1129 Type 2 diabetes mellitus with other diabetic kidney complication: Secondary | ICD-10-CM | POA: Diagnosis not present

## 2018-04-16 DIAGNOSIS — D631 Anemia in chronic kidney disease: Secondary | ICD-10-CM | POA: Diagnosis not present

## 2018-04-16 DIAGNOSIS — N2581 Secondary hyperparathyroidism of renal origin: Secondary | ICD-10-CM | POA: Diagnosis not present

## 2018-04-16 DIAGNOSIS — N186 End stage renal disease: Secondary | ICD-10-CM | POA: Diagnosis not present

## 2018-04-19 DIAGNOSIS — N2581 Secondary hyperparathyroidism of renal origin: Secondary | ICD-10-CM | POA: Diagnosis not present

## 2018-04-19 DIAGNOSIS — D631 Anemia in chronic kidney disease: Secondary | ICD-10-CM | POA: Diagnosis not present

## 2018-04-19 DIAGNOSIS — E1129 Type 2 diabetes mellitus with other diabetic kidney complication: Secondary | ICD-10-CM | POA: Diagnosis not present

## 2018-04-19 DIAGNOSIS — N186 End stage renal disease: Secondary | ICD-10-CM | POA: Diagnosis not present

## 2018-04-20 DIAGNOSIS — Z6830 Body mass index (BMI) 30.0-30.9, adult: Secondary | ICD-10-CM | POA: Diagnosis not present

## 2018-04-20 DIAGNOSIS — Z7901 Long term (current) use of anticoagulants: Secondary | ICD-10-CM | POA: Diagnosis not present

## 2018-04-21 DIAGNOSIS — N186 End stage renal disease: Secondary | ICD-10-CM | POA: Diagnosis not present

## 2018-04-21 DIAGNOSIS — D631 Anemia in chronic kidney disease: Secondary | ICD-10-CM | POA: Diagnosis not present

## 2018-04-21 DIAGNOSIS — E1129 Type 2 diabetes mellitus with other diabetic kidney complication: Secondary | ICD-10-CM | POA: Diagnosis not present

## 2018-04-21 DIAGNOSIS — N2581 Secondary hyperparathyroidism of renal origin: Secondary | ICD-10-CM | POA: Diagnosis not present

## 2018-04-23 DIAGNOSIS — D631 Anemia in chronic kidney disease: Secondary | ICD-10-CM | POA: Diagnosis not present

## 2018-04-23 DIAGNOSIS — N2581 Secondary hyperparathyroidism of renal origin: Secondary | ICD-10-CM | POA: Diagnosis not present

## 2018-04-23 DIAGNOSIS — E1129 Type 2 diabetes mellitus with other diabetic kidney complication: Secondary | ICD-10-CM | POA: Diagnosis not present

## 2018-04-23 DIAGNOSIS — N186 End stage renal disease: Secondary | ICD-10-CM | POA: Diagnosis not present

## 2018-04-25 DIAGNOSIS — N186 End stage renal disease: Secondary | ICD-10-CM | POA: Diagnosis not present

## 2018-04-25 DIAGNOSIS — E1129 Type 2 diabetes mellitus with other diabetic kidney complication: Secondary | ICD-10-CM | POA: Diagnosis not present

## 2018-04-25 DIAGNOSIS — N2581 Secondary hyperparathyroidism of renal origin: Secondary | ICD-10-CM | POA: Diagnosis not present

## 2018-04-25 DIAGNOSIS — D631 Anemia in chronic kidney disease: Secondary | ICD-10-CM | POA: Diagnosis not present

## 2018-04-27 DIAGNOSIS — E1129 Type 2 diabetes mellitus with other diabetic kidney complication: Secondary | ICD-10-CM | POA: Diagnosis not present

## 2018-04-27 DIAGNOSIS — N186 End stage renal disease: Secondary | ICD-10-CM | POA: Diagnosis not present

## 2018-04-27 DIAGNOSIS — N2581 Secondary hyperparathyroidism of renal origin: Secondary | ICD-10-CM | POA: Diagnosis not present

## 2018-04-27 DIAGNOSIS — D631 Anemia in chronic kidney disease: Secondary | ICD-10-CM | POA: Diagnosis not present

## 2018-04-30 DIAGNOSIS — D631 Anemia in chronic kidney disease: Secondary | ICD-10-CM | POA: Diagnosis not present

## 2018-04-30 DIAGNOSIS — E1129 Type 2 diabetes mellitus with other diabetic kidney complication: Secondary | ICD-10-CM | POA: Diagnosis not present

## 2018-04-30 DIAGNOSIS — N186 End stage renal disease: Secondary | ICD-10-CM | POA: Diagnosis not present

## 2018-04-30 DIAGNOSIS — N2581 Secondary hyperparathyroidism of renal origin: Secondary | ICD-10-CM | POA: Diagnosis not present

## 2018-05-01 DIAGNOSIS — N186 End stage renal disease: Secondary | ICD-10-CM | POA: Diagnosis not present

## 2018-05-01 DIAGNOSIS — E1129 Type 2 diabetes mellitus with other diabetic kidney complication: Secondary | ICD-10-CM | POA: Diagnosis not present

## 2018-05-01 DIAGNOSIS — Z992 Dependence on renal dialysis: Secondary | ICD-10-CM | POA: Diagnosis not present

## 2018-05-03 DIAGNOSIS — N2581 Secondary hyperparathyroidism of renal origin: Secondary | ICD-10-CM | POA: Diagnosis not present

## 2018-05-03 DIAGNOSIS — N186 End stage renal disease: Secondary | ICD-10-CM | POA: Diagnosis not present

## 2018-05-03 DIAGNOSIS — E1129 Type 2 diabetes mellitus with other diabetic kidney complication: Secondary | ICD-10-CM | POA: Diagnosis not present

## 2018-05-05 DIAGNOSIS — E1129 Type 2 diabetes mellitus with other diabetic kidney complication: Secondary | ICD-10-CM | POA: Diagnosis not present

## 2018-05-05 DIAGNOSIS — N186 End stage renal disease: Secondary | ICD-10-CM | POA: Diagnosis not present

## 2018-05-05 DIAGNOSIS — N2581 Secondary hyperparathyroidism of renal origin: Secondary | ICD-10-CM | POA: Diagnosis not present

## 2018-05-07 DIAGNOSIS — E1129 Type 2 diabetes mellitus with other diabetic kidney complication: Secondary | ICD-10-CM | POA: Diagnosis not present

## 2018-05-07 DIAGNOSIS — N2581 Secondary hyperparathyroidism of renal origin: Secondary | ICD-10-CM | POA: Diagnosis not present

## 2018-05-07 DIAGNOSIS — N186 End stage renal disease: Secondary | ICD-10-CM | POA: Diagnosis not present

## 2018-05-10 DIAGNOSIS — E1129 Type 2 diabetes mellitus with other diabetic kidney complication: Secondary | ICD-10-CM | POA: Diagnosis not present

## 2018-05-10 DIAGNOSIS — N2581 Secondary hyperparathyroidism of renal origin: Secondary | ICD-10-CM | POA: Diagnosis not present

## 2018-05-10 DIAGNOSIS — N186 End stage renal disease: Secondary | ICD-10-CM | POA: Diagnosis not present

## 2018-05-12 DIAGNOSIS — E1129 Type 2 diabetes mellitus with other diabetic kidney complication: Secondary | ICD-10-CM | POA: Diagnosis not present

## 2018-05-12 DIAGNOSIS — N186 End stage renal disease: Secondary | ICD-10-CM | POA: Diagnosis not present

## 2018-05-12 DIAGNOSIS — N2581 Secondary hyperparathyroidism of renal origin: Secondary | ICD-10-CM | POA: Diagnosis not present

## 2018-05-14 DIAGNOSIS — N186 End stage renal disease: Secondary | ICD-10-CM | POA: Diagnosis not present

## 2018-05-14 DIAGNOSIS — E1129 Type 2 diabetes mellitus with other diabetic kidney complication: Secondary | ICD-10-CM | POA: Diagnosis not present

## 2018-05-14 DIAGNOSIS — N2581 Secondary hyperparathyroidism of renal origin: Secondary | ICD-10-CM | POA: Diagnosis not present

## 2018-05-16 DIAGNOSIS — M65331 Trigger finger, right middle finger: Secondary | ICD-10-CM | POA: Diagnosis not present

## 2018-05-16 DIAGNOSIS — I10 Essential (primary) hypertension: Secondary | ICD-10-CM | POA: Diagnosis not present

## 2018-05-16 DIAGNOSIS — I5022 Chronic systolic (congestive) heart failure: Secondary | ICD-10-CM | POA: Diagnosis not present

## 2018-05-16 DIAGNOSIS — H3520 Other non-diabetic proliferative retinopathy, unspecified eye: Secondary | ICD-10-CM | POA: Diagnosis not present

## 2018-05-16 DIAGNOSIS — E7849 Other hyperlipidemia: Secondary | ICD-10-CM | POA: Diagnosis not present

## 2018-05-16 DIAGNOSIS — E1140 Type 2 diabetes mellitus with diabetic neuropathy, unspecified: Secondary | ICD-10-CM | POA: Diagnosis not present

## 2018-05-16 DIAGNOSIS — Z1389 Encounter for screening for other disorder: Secondary | ICD-10-CM | POA: Diagnosis not present

## 2018-05-16 DIAGNOSIS — E1129 Type 2 diabetes mellitus with other diabetic kidney complication: Secondary | ICD-10-CM | POA: Diagnosis not present

## 2018-05-16 DIAGNOSIS — E1139 Type 2 diabetes mellitus with other diabetic ophthalmic complication: Secondary | ICD-10-CM | POA: Diagnosis not present

## 2018-05-16 DIAGNOSIS — M65332 Trigger finger, left middle finger: Secondary | ICD-10-CM | POA: Diagnosis not present

## 2018-05-16 DIAGNOSIS — I313 Pericardial effusion (noninflammatory): Secondary | ICD-10-CM | POA: Diagnosis not present

## 2018-05-16 DIAGNOSIS — Z7901 Long term (current) use of anticoagulants: Secondary | ICD-10-CM | POA: Diagnosis not present

## 2018-05-17 DIAGNOSIS — N2581 Secondary hyperparathyroidism of renal origin: Secondary | ICD-10-CM | POA: Diagnosis not present

## 2018-05-17 DIAGNOSIS — N186 End stage renal disease: Secondary | ICD-10-CM | POA: Diagnosis not present

## 2018-05-17 DIAGNOSIS — E1129 Type 2 diabetes mellitus with other diabetic kidney complication: Secondary | ICD-10-CM | POA: Diagnosis not present

## 2018-05-18 ENCOUNTER — Ambulatory Visit (INDEPENDENT_AMBULATORY_CARE_PROVIDER_SITE_OTHER): Payer: Medicare Other | Admitting: Podiatry

## 2018-05-18 ENCOUNTER — Encounter: Payer: Self-pay | Admitting: Podiatry

## 2018-05-18 DIAGNOSIS — M79676 Pain in unspecified toe(s): Secondary | ICD-10-CM

## 2018-05-18 DIAGNOSIS — B351 Tinea unguium: Secondary | ICD-10-CM

## 2018-05-18 DIAGNOSIS — E0842 Diabetes mellitus due to underlying condition with diabetic polyneuropathy: Secondary | ICD-10-CM

## 2018-05-18 DIAGNOSIS — Z89431 Acquired absence of right foot: Secondary | ICD-10-CM | POA: Diagnosis not present

## 2018-05-18 NOTE — Progress Notes (Signed)
Complaint:  Visit Type: Patient returns to my office for continued preventative foot care services. Complaint: Patient states" my nails have grown long and thick and become painful to walk and wear shoes on his left foot.  Patient has been diagnosed with DM with angiopathy.  Patient has had transmetatarsal amputation performed toes right foot.. The patient presents for preventative foot care services. No changes to ROS  Podiatric Exam: Vascular: dorsalis pedis and posterior tibial pulses are not  palpable bilateral... Capillary return is immediate. Temperature gradient is WNL. Skin turgor WNL  Significant swelling feet  B/L. Sensorium: Normal Semmes Weinstein monofilament test. Normal tactile sensation left foot. Nail Exam: Pt has thick disfigured discolored nails with subungual debris noted  entire nail hallux through fifth toenails left foot. Ulcer Exam: There is no evidence of ulcer or pre-ulcerative changes or infection. Orthopedic Exam: Muscle tone and strength are WNL. No limitations in general ROM. No crepitus or effusions noted. Foot type and digits show no abnormalities. Bony prominences are unremarkable. TMA right foot. Skin: No Porokeratosis. No infection or ulcers  Diagnosis:  Onychomycosis, ,, pain in left toes,  TMA right foot.  Treatment & Plan Procedures and Treatment: Consent by patient was obtained for treatment procedures.   Debridement of mycotic and hypertrophic toenails, 1 through 5 left foot  and clearing of subungual debris. No ulceration, no infection noted.  Return Visit-Office Procedure: Patient instructed to return to the office for a follow up visit 3 months for continued evaluation and treatment.    Gardiner Barefoot DPM

## 2018-05-19 DIAGNOSIS — E1129 Type 2 diabetes mellitus with other diabetic kidney complication: Secondary | ICD-10-CM | POA: Diagnosis not present

## 2018-05-19 DIAGNOSIS — N2581 Secondary hyperparathyroidism of renal origin: Secondary | ICD-10-CM | POA: Diagnosis not present

## 2018-05-19 DIAGNOSIS — N186 End stage renal disease: Secondary | ICD-10-CM | POA: Diagnosis not present

## 2018-05-21 DIAGNOSIS — N2581 Secondary hyperparathyroidism of renal origin: Secondary | ICD-10-CM | POA: Diagnosis not present

## 2018-05-21 DIAGNOSIS — N186 End stage renal disease: Secondary | ICD-10-CM | POA: Diagnosis not present

## 2018-05-21 DIAGNOSIS — E1129 Type 2 diabetes mellitus with other diabetic kidney complication: Secondary | ICD-10-CM | POA: Diagnosis not present

## 2018-05-23 DIAGNOSIS — N2581 Secondary hyperparathyroidism of renal origin: Secondary | ICD-10-CM | POA: Diagnosis not present

## 2018-05-23 DIAGNOSIS — N186 End stage renal disease: Secondary | ICD-10-CM | POA: Diagnosis not present

## 2018-05-23 DIAGNOSIS — E1129 Type 2 diabetes mellitus with other diabetic kidney complication: Secondary | ICD-10-CM | POA: Diagnosis not present

## 2018-05-26 DIAGNOSIS — E1129 Type 2 diabetes mellitus with other diabetic kidney complication: Secondary | ICD-10-CM | POA: Diagnosis not present

## 2018-05-26 DIAGNOSIS — N186 End stage renal disease: Secondary | ICD-10-CM | POA: Diagnosis not present

## 2018-05-26 DIAGNOSIS — N2581 Secondary hyperparathyroidism of renal origin: Secondary | ICD-10-CM | POA: Diagnosis not present

## 2018-05-28 DIAGNOSIS — N186 End stage renal disease: Secondary | ICD-10-CM | POA: Diagnosis not present

## 2018-05-28 DIAGNOSIS — E1129 Type 2 diabetes mellitus with other diabetic kidney complication: Secondary | ICD-10-CM | POA: Diagnosis not present

## 2018-05-28 DIAGNOSIS — N2581 Secondary hyperparathyroidism of renal origin: Secondary | ICD-10-CM | POA: Diagnosis not present

## 2018-05-30 DIAGNOSIS — N2581 Secondary hyperparathyroidism of renal origin: Secondary | ICD-10-CM | POA: Diagnosis not present

## 2018-05-30 DIAGNOSIS — N186 End stage renal disease: Secondary | ICD-10-CM | POA: Diagnosis not present

## 2018-05-30 DIAGNOSIS — E1129 Type 2 diabetes mellitus with other diabetic kidney complication: Secondary | ICD-10-CM | POA: Diagnosis not present

## 2018-06-01 DIAGNOSIS — Z992 Dependence on renal dialysis: Secondary | ICD-10-CM | POA: Diagnosis not present

## 2018-06-01 DIAGNOSIS — N186 End stage renal disease: Secondary | ICD-10-CM | POA: Diagnosis not present

## 2018-06-01 DIAGNOSIS — E1129 Type 2 diabetes mellitus with other diabetic kidney complication: Secondary | ICD-10-CM | POA: Diagnosis not present

## 2018-06-02 DIAGNOSIS — N2581 Secondary hyperparathyroidism of renal origin: Secondary | ICD-10-CM | POA: Diagnosis not present

## 2018-06-02 DIAGNOSIS — D509 Iron deficiency anemia, unspecified: Secondary | ICD-10-CM | POA: Diagnosis not present

## 2018-06-02 DIAGNOSIS — E876 Hypokalemia: Secondary | ICD-10-CM | POA: Diagnosis not present

## 2018-06-02 DIAGNOSIS — N186 End stage renal disease: Secondary | ICD-10-CM | POA: Diagnosis not present

## 2018-06-02 DIAGNOSIS — E1129 Type 2 diabetes mellitus with other diabetic kidney complication: Secondary | ICD-10-CM | POA: Diagnosis not present

## 2018-06-04 DIAGNOSIS — N186 End stage renal disease: Secondary | ICD-10-CM | POA: Diagnosis not present

## 2018-06-04 DIAGNOSIS — E876 Hypokalemia: Secondary | ICD-10-CM | POA: Diagnosis not present

## 2018-06-04 DIAGNOSIS — N2581 Secondary hyperparathyroidism of renal origin: Secondary | ICD-10-CM | POA: Diagnosis not present

## 2018-06-04 DIAGNOSIS — D509 Iron deficiency anemia, unspecified: Secondary | ICD-10-CM | POA: Diagnosis not present

## 2018-06-04 DIAGNOSIS — E1129 Type 2 diabetes mellitus with other diabetic kidney complication: Secondary | ICD-10-CM | POA: Diagnosis not present

## 2018-06-07 DIAGNOSIS — E1129 Type 2 diabetes mellitus with other diabetic kidney complication: Secondary | ICD-10-CM | POA: Diagnosis not present

## 2018-06-07 DIAGNOSIS — N186 End stage renal disease: Secondary | ICD-10-CM | POA: Diagnosis not present

## 2018-06-07 DIAGNOSIS — E876 Hypokalemia: Secondary | ICD-10-CM | POA: Diagnosis not present

## 2018-06-07 DIAGNOSIS — D509 Iron deficiency anemia, unspecified: Secondary | ICD-10-CM | POA: Diagnosis not present

## 2018-06-07 DIAGNOSIS — N2581 Secondary hyperparathyroidism of renal origin: Secondary | ICD-10-CM | POA: Diagnosis not present

## 2018-06-09 DIAGNOSIS — E1129 Type 2 diabetes mellitus with other diabetic kidney complication: Secondary | ICD-10-CM | POA: Diagnosis not present

## 2018-06-09 DIAGNOSIS — E876 Hypokalemia: Secondary | ICD-10-CM | POA: Diagnosis not present

## 2018-06-09 DIAGNOSIS — N186 End stage renal disease: Secondary | ICD-10-CM | POA: Diagnosis not present

## 2018-06-09 DIAGNOSIS — N2581 Secondary hyperparathyroidism of renal origin: Secondary | ICD-10-CM | POA: Diagnosis not present

## 2018-06-09 DIAGNOSIS — D509 Iron deficiency anemia, unspecified: Secondary | ICD-10-CM | POA: Diagnosis not present

## 2018-06-11 DIAGNOSIS — E1129 Type 2 diabetes mellitus with other diabetic kidney complication: Secondary | ICD-10-CM | POA: Diagnosis not present

## 2018-06-11 DIAGNOSIS — E876 Hypokalemia: Secondary | ICD-10-CM | POA: Diagnosis not present

## 2018-06-11 DIAGNOSIS — N186 End stage renal disease: Secondary | ICD-10-CM | POA: Diagnosis not present

## 2018-06-11 DIAGNOSIS — D509 Iron deficiency anemia, unspecified: Secondary | ICD-10-CM | POA: Diagnosis not present

## 2018-06-11 DIAGNOSIS — N2581 Secondary hyperparathyroidism of renal origin: Secondary | ICD-10-CM | POA: Diagnosis not present

## 2018-06-14 DIAGNOSIS — N2581 Secondary hyperparathyroidism of renal origin: Secondary | ICD-10-CM | POA: Diagnosis not present

## 2018-06-14 DIAGNOSIS — D509 Iron deficiency anemia, unspecified: Secondary | ICD-10-CM | POA: Diagnosis not present

## 2018-06-14 DIAGNOSIS — N186 End stage renal disease: Secondary | ICD-10-CM | POA: Diagnosis not present

## 2018-06-14 DIAGNOSIS — E1129 Type 2 diabetes mellitus with other diabetic kidney complication: Secondary | ICD-10-CM | POA: Diagnosis not present

## 2018-06-14 DIAGNOSIS — E876 Hypokalemia: Secondary | ICD-10-CM | POA: Diagnosis not present

## 2018-06-15 DIAGNOSIS — Z7901 Long term (current) use of anticoagulants: Secondary | ICD-10-CM | POA: Diagnosis not present

## 2018-06-15 DIAGNOSIS — Z6831 Body mass index (BMI) 31.0-31.9, adult: Secondary | ICD-10-CM | POA: Diagnosis not present

## 2018-06-15 DIAGNOSIS — I4891 Unspecified atrial fibrillation: Secondary | ICD-10-CM | POA: Diagnosis not present

## 2018-06-16 DIAGNOSIS — E1129 Type 2 diabetes mellitus with other diabetic kidney complication: Secondary | ICD-10-CM | POA: Diagnosis not present

## 2018-06-16 DIAGNOSIS — N2581 Secondary hyperparathyroidism of renal origin: Secondary | ICD-10-CM | POA: Diagnosis not present

## 2018-06-16 DIAGNOSIS — E876 Hypokalemia: Secondary | ICD-10-CM | POA: Diagnosis not present

## 2018-06-16 DIAGNOSIS — D509 Iron deficiency anemia, unspecified: Secondary | ICD-10-CM | POA: Diagnosis not present

## 2018-06-16 DIAGNOSIS — N186 End stage renal disease: Secondary | ICD-10-CM | POA: Diagnosis not present

## 2018-06-18 DIAGNOSIS — D509 Iron deficiency anemia, unspecified: Secondary | ICD-10-CM | POA: Diagnosis not present

## 2018-06-18 DIAGNOSIS — N186 End stage renal disease: Secondary | ICD-10-CM | POA: Diagnosis not present

## 2018-06-18 DIAGNOSIS — N2581 Secondary hyperparathyroidism of renal origin: Secondary | ICD-10-CM | POA: Diagnosis not present

## 2018-06-18 DIAGNOSIS — E1129 Type 2 diabetes mellitus with other diabetic kidney complication: Secondary | ICD-10-CM | POA: Diagnosis not present

## 2018-06-18 DIAGNOSIS — E876 Hypokalemia: Secondary | ICD-10-CM | POA: Diagnosis not present

## 2018-06-21 DIAGNOSIS — D509 Iron deficiency anemia, unspecified: Secondary | ICD-10-CM | POA: Diagnosis not present

## 2018-06-21 DIAGNOSIS — E876 Hypokalemia: Secondary | ICD-10-CM | POA: Diagnosis not present

## 2018-06-21 DIAGNOSIS — N186 End stage renal disease: Secondary | ICD-10-CM | POA: Diagnosis not present

## 2018-06-21 DIAGNOSIS — N2581 Secondary hyperparathyroidism of renal origin: Secondary | ICD-10-CM | POA: Diagnosis not present

## 2018-06-21 DIAGNOSIS — E1129 Type 2 diabetes mellitus with other diabetic kidney complication: Secondary | ICD-10-CM | POA: Diagnosis not present

## 2018-06-23 DIAGNOSIS — E1129 Type 2 diabetes mellitus with other diabetic kidney complication: Secondary | ICD-10-CM | POA: Diagnosis not present

## 2018-06-23 DIAGNOSIS — E876 Hypokalemia: Secondary | ICD-10-CM | POA: Diagnosis not present

## 2018-06-23 DIAGNOSIS — D509 Iron deficiency anemia, unspecified: Secondary | ICD-10-CM | POA: Diagnosis not present

## 2018-06-23 DIAGNOSIS — N186 End stage renal disease: Secondary | ICD-10-CM | POA: Diagnosis not present

## 2018-06-23 DIAGNOSIS — N2581 Secondary hyperparathyroidism of renal origin: Secondary | ICD-10-CM | POA: Diagnosis not present

## 2018-06-25 DIAGNOSIS — N2581 Secondary hyperparathyroidism of renal origin: Secondary | ICD-10-CM | POA: Diagnosis not present

## 2018-06-25 DIAGNOSIS — D509 Iron deficiency anemia, unspecified: Secondary | ICD-10-CM | POA: Diagnosis not present

## 2018-06-25 DIAGNOSIS — E876 Hypokalemia: Secondary | ICD-10-CM | POA: Diagnosis not present

## 2018-06-25 DIAGNOSIS — N186 End stage renal disease: Secondary | ICD-10-CM | POA: Diagnosis not present

## 2018-06-25 DIAGNOSIS — E1129 Type 2 diabetes mellitus with other diabetic kidney complication: Secondary | ICD-10-CM | POA: Diagnosis not present

## 2018-06-27 ENCOUNTER — Encounter (INDEPENDENT_AMBULATORY_CARE_PROVIDER_SITE_OTHER): Payer: Medicare Other | Admitting: Ophthalmology

## 2018-06-27 DIAGNOSIS — E11319 Type 2 diabetes mellitus with unspecified diabetic retinopathy without macular edema: Secondary | ICD-10-CM | POA: Diagnosis not present

## 2018-06-27 DIAGNOSIS — H35033 Hypertensive retinopathy, bilateral: Secondary | ICD-10-CM

## 2018-06-27 DIAGNOSIS — E113593 Type 2 diabetes mellitus with proliferative diabetic retinopathy without macular edema, bilateral: Secondary | ICD-10-CM | POA: Diagnosis not present

## 2018-06-27 DIAGNOSIS — I10 Essential (primary) hypertension: Secondary | ICD-10-CM | POA: Diagnosis not present

## 2018-06-28 DIAGNOSIS — E876 Hypokalemia: Secondary | ICD-10-CM | POA: Diagnosis not present

## 2018-06-28 DIAGNOSIS — N186 End stage renal disease: Secondary | ICD-10-CM | POA: Diagnosis not present

## 2018-06-28 DIAGNOSIS — D509 Iron deficiency anemia, unspecified: Secondary | ICD-10-CM | POA: Diagnosis not present

## 2018-06-28 DIAGNOSIS — N2581 Secondary hyperparathyroidism of renal origin: Secondary | ICD-10-CM | POA: Diagnosis not present

## 2018-06-28 DIAGNOSIS — E1129 Type 2 diabetes mellitus with other diabetic kidney complication: Secondary | ICD-10-CM | POA: Diagnosis not present

## 2018-06-30 DIAGNOSIS — E876 Hypokalemia: Secondary | ICD-10-CM | POA: Diagnosis not present

## 2018-06-30 DIAGNOSIS — N186 End stage renal disease: Secondary | ICD-10-CM | POA: Diagnosis not present

## 2018-06-30 DIAGNOSIS — E1129 Type 2 diabetes mellitus with other diabetic kidney complication: Secondary | ICD-10-CM | POA: Diagnosis not present

## 2018-06-30 DIAGNOSIS — D509 Iron deficiency anemia, unspecified: Secondary | ICD-10-CM | POA: Diagnosis not present

## 2018-06-30 DIAGNOSIS — N2581 Secondary hyperparathyroidism of renal origin: Secondary | ICD-10-CM | POA: Diagnosis not present

## 2018-07-02 DIAGNOSIS — E876 Hypokalemia: Secondary | ICD-10-CM | POA: Diagnosis not present

## 2018-07-02 DIAGNOSIS — Z992 Dependence on renal dialysis: Secondary | ICD-10-CM | POA: Diagnosis not present

## 2018-07-02 DIAGNOSIS — N2581 Secondary hyperparathyroidism of renal origin: Secondary | ICD-10-CM | POA: Diagnosis not present

## 2018-07-02 DIAGNOSIS — E1129 Type 2 diabetes mellitus with other diabetic kidney complication: Secondary | ICD-10-CM | POA: Diagnosis not present

## 2018-07-02 DIAGNOSIS — N186 End stage renal disease: Secondary | ICD-10-CM | POA: Diagnosis not present

## 2018-07-05 DIAGNOSIS — N186 End stage renal disease: Secondary | ICD-10-CM | POA: Diagnosis not present

## 2018-07-05 DIAGNOSIS — N2581 Secondary hyperparathyroidism of renal origin: Secondary | ICD-10-CM | POA: Diagnosis not present

## 2018-07-05 DIAGNOSIS — E1129 Type 2 diabetes mellitus with other diabetic kidney complication: Secondary | ICD-10-CM | POA: Diagnosis not present

## 2018-07-05 DIAGNOSIS — E876 Hypokalemia: Secondary | ICD-10-CM | POA: Diagnosis not present

## 2018-07-07 DIAGNOSIS — N186 End stage renal disease: Secondary | ICD-10-CM | POA: Diagnosis not present

## 2018-07-07 DIAGNOSIS — E876 Hypokalemia: Secondary | ICD-10-CM | POA: Diagnosis not present

## 2018-07-07 DIAGNOSIS — E1129 Type 2 diabetes mellitus with other diabetic kidney complication: Secondary | ICD-10-CM | POA: Diagnosis not present

## 2018-07-07 DIAGNOSIS — N2581 Secondary hyperparathyroidism of renal origin: Secondary | ICD-10-CM | POA: Diagnosis not present

## 2018-07-09 DIAGNOSIS — N186 End stage renal disease: Secondary | ICD-10-CM | POA: Diagnosis not present

## 2018-07-09 DIAGNOSIS — N2581 Secondary hyperparathyroidism of renal origin: Secondary | ICD-10-CM | POA: Diagnosis not present

## 2018-07-09 DIAGNOSIS — E876 Hypokalemia: Secondary | ICD-10-CM | POA: Diagnosis not present

## 2018-07-09 DIAGNOSIS — E1129 Type 2 diabetes mellitus with other diabetic kidney complication: Secondary | ICD-10-CM | POA: Diagnosis not present

## 2018-07-12 DIAGNOSIS — E1129 Type 2 diabetes mellitus with other diabetic kidney complication: Secondary | ICD-10-CM | POA: Diagnosis not present

## 2018-07-12 DIAGNOSIS — N2581 Secondary hyperparathyroidism of renal origin: Secondary | ICD-10-CM | POA: Diagnosis not present

## 2018-07-12 DIAGNOSIS — E876 Hypokalemia: Secondary | ICD-10-CM | POA: Diagnosis not present

## 2018-07-12 DIAGNOSIS — N186 End stage renal disease: Secondary | ICD-10-CM | POA: Diagnosis not present

## 2018-07-13 ENCOUNTER — Encounter: Payer: Self-pay | Admitting: *Deleted

## 2018-07-14 DIAGNOSIS — N2581 Secondary hyperparathyroidism of renal origin: Secondary | ICD-10-CM | POA: Diagnosis not present

## 2018-07-14 DIAGNOSIS — N186 End stage renal disease: Secondary | ICD-10-CM | POA: Diagnosis not present

## 2018-07-14 DIAGNOSIS — E876 Hypokalemia: Secondary | ICD-10-CM | POA: Diagnosis not present

## 2018-07-14 DIAGNOSIS — E1129 Type 2 diabetes mellitus with other diabetic kidney complication: Secondary | ICD-10-CM | POA: Diagnosis not present

## 2018-07-16 ENCOUNTER — Emergency Department (HOSPITAL_COMMUNITY)
Admission: EM | Admit: 2018-07-16 | Discharge: 2018-07-17 | Disposition: A | Discharge status: Home / Self Care | Payer: Medicare Other | Attending: Emergency Medicine | Admitting: Emergency Medicine

## 2018-07-16 ENCOUNTER — Encounter (HOSPITAL_COMMUNITY): Payer: Self-pay | Admitting: Emergency Medicine

## 2018-07-16 ENCOUNTER — Other Ambulatory Visit: Payer: Self-pay

## 2018-07-16 ENCOUNTER — Emergency Department (HOSPITAL_COMMUNITY): Payer: Medicare Other

## 2018-07-16 DIAGNOSIS — Z85828 Personal history of other malignant neoplasm of skin: Secondary | ICD-10-CM | POA: Diagnosis not present

## 2018-07-16 DIAGNOSIS — J189 Pneumonia, unspecified organism: Secondary | ICD-10-CM | POA: Insufficient documentation

## 2018-07-16 DIAGNOSIS — I129 Hypertensive chronic kidney disease with stage 1 through stage 4 chronic kidney disease, or unspecified chronic kidney disease: Secondary | ICD-10-CM | POA: Insufficient documentation

## 2018-07-16 DIAGNOSIS — E1122 Type 2 diabetes mellitus with diabetic chronic kidney disease: Secondary | ICD-10-CM | POA: Insufficient documentation

## 2018-07-16 DIAGNOSIS — Z86718 Personal history of other venous thrombosis and embolism: Secondary | ICD-10-CM | POA: Diagnosis not present

## 2018-07-16 DIAGNOSIS — Z79899 Other long term (current) drug therapy: Secondary | ICD-10-CM | POA: Insufficient documentation

## 2018-07-16 DIAGNOSIS — R0602 Shortness of breath: Secondary | ICD-10-CM | POA: Diagnosis not present

## 2018-07-16 DIAGNOSIS — N184 Chronic kidney disease, stage 4 (severe): Secondary | ICD-10-CM | POA: Insufficient documentation

## 2018-07-16 DIAGNOSIS — N2581 Secondary hyperparathyroidism of renal origin: Secondary | ICD-10-CM | POA: Diagnosis not present

## 2018-07-16 DIAGNOSIS — E876 Hypokalemia: Secondary | ICD-10-CM | POA: Diagnosis not present

## 2018-07-16 DIAGNOSIS — N186 End stage renal disease: Secondary | ICD-10-CM | POA: Diagnosis not present

## 2018-07-16 DIAGNOSIS — Z794 Long term (current) use of insulin: Secondary | ICD-10-CM | POA: Insufficient documentation

## 2018-07-16 DIAGNOSIS — E1129 Type 2 diabetes mellitus with other diabetic kidney complication: Secondary | ICD-10-CM | POA: Diagnosis not present

## 2018-07-16 DIAGNOSIS — J181 Lobar pneumonia, unspecified organism: Secondary | ICD-10-CM

## 2018-07-16 DIAGNOSIS — R079 Chest pain, unspecified: Secondary | ICD-10-CM | POA: Diagnosis not present

## 2018-07-16 DIAGNOSIS — R0789 Other chest pain: Secondary | ICD-10-CM | POA: Diagnosis present

## 2018-07-16 LAB — CBC
HCT: 32.6 % — ABNORMAL LOW (ref 39.0–52.0)
Hemoglobin: 10.7 g/dL — ABNORMAL LOW (ref 13.0–17.0)
MCH: 30.1 pg (ref 26.0–34.0)
MCHC: 32.8 g/dL (ref 30.0–36.0)
MCV: 91.6 fL (ref 80.0–100.0)
Platelets: 132 10*3/uL — ABNORMAL LOW (ref 150–400)
RBC: 3.56 MIL/uL — ABNORMAL LOW (ref 4.22–5.81)
RDW: 17.2 % — ABNORMAL HIGH (ref 11.5–15.5)
WBC: 6.2 10*3/uL (ref 4.0–10.5)
nRBC: 0 % (ref 0.0–0.2)

## 2018-07-16 LAB — I-STAT TROPONIN, ED: Troponin i, poc: 0.07 ng/mL (ref 0.00–0.08)

## 2018-07-16 MED ORDER — SODIUM CHLORIDE 0.9% FLUSH: 3.0000 mL | Freq: Once | INTRAVENOUS | Status: DC

## 2018-07-16 NOTE — ED Triage Notes (Signed)
C/o L sided chest pain that started after he got home from dialysis today.  Took Tylenol and went to sleep.  Pain resolved.  Woke up and layed there for awhile and pain started again.  Reports SOB.  Denies nausea and vomiting.

## 2018-07-17 LAB — BASIC METABOLIC PANEL
Anion gap: 12 (ref 5–15)
BUN: 17 mg/dL (ref 8–23)
CO2: 29 mmol/L (ref 22–32)
Calcium: 8.4 mg/dL — ABNORMAL LOW (ref 8.9–10.3)
Chloride: 93 mmol/L — ABNORMAL LOW (ref 98–111)
Creatinine, Ser: 4.22 mg/dL — ABNORMAL HIGH (ref 0.61–1.24)
GFR calc Af Amer: 16 mL/min — ABNORMAL LOW (ref >60)
GFR calc non Af Amer: 14 mL/min — ABNORMAL LOW (ref >60)
Glucose, Bld: 135 mg/dL — ABNORMAL HIGH (ref 70–99)
Potassium: 3.8 mmol/L (ref 3.5–5.1)
Sodium: 134 mmol/L — ABNORMAL LOW (ref 135–145)

## 2018-07-17 MED ORDER — LEVOFLOXACIN 500 MG PO TABS: 500.0000 mg | ORAL_TABLET | ORAL | 0 refills | Status: DC

## 2018-07-17 MED ORDER — LEVOFLOXACIN 750 MG PO TABS
750.0000 mg | ORAL_TABLET | Freq: Once | ORAL | Status: AC
  Administered 2018-07-17: 750 mg via ORAL
  Filled 2018-07-17: qty 1

## 2018-07-17 MED ORDER — TRAMADOL HCL 50 MG PO TABS: 50.0000 mg | ORAL_TABLET | Freq: Four times a day (QID) | ORAL | 0 refills | Status: DC | PRN

## 2018-07-17 MED ORDER — NITROGLYCERIN 0.4 MG SL SUBL
0.4000 mg | SUBLINGUAL_TABLET | SUBLINGUAL | Status: DC | PRN
  Administered 2018-07-17 (×2): 0.4 mg via SUBLINGUAL
  Filled 2018-07-17: qty 1

## 2018-07-17 MED ORDER — ASPIRIN 81 MG PO CHEW
324.0000 mg | CHEWABLE_TABLET | Freq: Once | ORAL | Status: AC
  Administered 2018-07-17: 324 mg via ORAL
  Filled 2018-07-17: qty 4

## 2018-07-17 NOTE — ED Provider Notes (Signed)
[facility] EMERGENCY DEPARTMENT Provider Note   CSN: [id number] Arrival date & time: 07/16/18  2248     History   Chief Complaint Chief Complaint  Patient presents with  . Chest Pain    HPI [name] is a 68 y.o. male.  Patient presents to the emergency department for evaluation of chest pain.  Patient reports that he had onset of left-sided chest pain earlier today while at rest.  He took Tylenol and the pain went away.  He reports that he was awakened from sleep tonight with recurrence of the pain.  Pain in the left chest area associated by shortness of breath.  No nausea, diaphoresis, vomiting.  He reports that the pain is still present but much better than it was and he is no longer short of breath.     Past Medical History:  Diagnosis Date  . Anemia   . Arthritis    HNP- lumbar, "all over my body"  . Blood transfusion    "years ago; blood was low" (08/05/2013)  . CKD (chronic kidney disease) stage 4, GFR 15-29 ml/min (HCC) 03/18/2012   [place]- T,TH,Sat.  . Diabetic nephropathy ([place])   . Diabetic retinopathy   . DVT (deep venous thrombosis) ([place])    "got one in my right leg now; I've had one before too, not sure which leg" (08/05/2013)  . ESRD (end stage renal disease) on dialysis [facility])    "just started today, (08/04/2013)"  . Family history of anesthesia complication    " my son wakes up slowly"  . GERD (gastroesophageal reflux disease)    uses alka seltzere on occas.   [name])    "one q now and then" (08/05/2013)  . Hyperlipidemia   . Hypertension   . 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 Non Hodgkin's lymphoma ([place])    Tx 2009; "had chemo; it went away" (08/05/2013)  . Noncompliance 03/16/2012  . NSVT (nonsustained ventricular tachycardia) ([place]) 03/18/2012  . Peripheral vascular disease ([place])   . Pneumonia 2013   hosp.-   . Poor  historian    pt. unsure of several answers to health history questions   . Skin cancer    melanoma - head  . Sleep apnea    "suppose to have a sleep study, but they never told me when. (08/05/2013)    Patient Active Problem List   Diagnosis Date Noted  . Malnutrition of moderate degree 01/17/2018  . Toxic metabolic encephalopathy 50/35/4656  . Elevated LFTs   . Idiopathic acute pancreatitis without infection or necrosis   . Acute respiratory failure with hypoxia ([place])   . Septic shock ([place])   . Cholecystitis   . Metabolic acidosis   . Pericardial effusion with cardiac tamponade   . Hypotension 01/09/2018  . Sepsis ([place]) 01/09/2018  . Skin cancer of scalp or skin of neck 11/11/2016  . Abnormal nuclear stress test 09/11/2016  . Systolic heart failure ([place])   . History of colonic polyps   . Benign neoplasm of transverse colon   . ESRD (end stage renal disease) ([place]) 01/31/2013  . Chronic anticoagulation 12/27/2012  . Vitreous hemorrhage ([place]) 04/19/2012  . NSVT (nonsustained ventricular tachycardia) ([place]) 03/18/2012  . Noncompliance 03/16/2012  . Chest pain, no MI, negative myoview, most likely muscular sketal pain 03/15/2012  . Proliferative diabetic retinopathy associated with type 2 diabetes mellitus ([place])  08/18/2011  . 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 Non Hodgkin's lymphoma ([place])   . Hyperlipidemia   . Diabetic nephropathy ([place])   . DVT (deep venous thrombosis) ([place])   . Healthcare-associated pneumonia 01/15/2011  . Anemia in chronic kidney disease 01/12/2011  . Warfarin-induced coagulopathy ([place]) 01/12/2011  . History of DVT of lower extremity 01/12/2011  . ABDOMINAL PAIN -GENERALIZED 10/24/2008  . PERSONAL HX COLONIC POLYPS 10/24/2008    Past Surgical History:  Procedure Laterality Date  . ACHILLES TENDON SURGERY Right 03/13/2016   Procedure: ACHILLES LENGTHENING/KIDNER;  Surgeon: Edrick Kins, DPM;  Location: [facility];  Service: Podiatry;  Laterality:  Right;  . AV FISTULA PLACEMENT Left 02/03/2013   Procedure: ARTERIOVENOUS (AV) FISTULA CREATION- LEFT RADIAL CEPHALIC; ULTRASOUND GUIDED;  Surgeon: Mal Misty, MD;  Location: [facility] OR;  Service: Vascular;  Laterality: Left;  . AV FISTULA PLACEMENT Right 11/08/2015   Procedure: RIGHT BRACHIOCEPHALIC ARTERIOVENOUS (AV) FISTULA CREATION;  Surgeon: Serafina Mitchell, MD;  Location: [facility];  Service: Vascular;  Laterality: Right;  . BASCILIC VEIN TRANSPOSITION Right 01/24/2016   Procedure: RIGHT SECOND STAGE BASILIC VEIN TRANSPOSITION;  Surgeon: Angelia Mould, MD;  Location: [place];  Service: Vascular;  Laterality: Right;  . CARDIAC CATHETERIZATION    . COLONOSCOPY N/A 09/23/2015   Procedure: COLONOSCOPY;  Surgeon: Irene Shipper, MD;  Location: [facility];  Service: Endoscopy;  Laterality: N/A;  . Coloscopy    . EYE SURGERY Bilateral   . GAS INSERTION  05/10/2012   Procedure: INSERTION OF GAS;  Surgeon: Hayden Pedro, MD;  Location: [facility] [place];  Service: Ophthalmology;  Laterality: Right;  . LEFT HEART CATH AND CORONARY ANGIOGRAPHY N/A 09/11/2016   Procedure: Left Heart Cath and Coronary Angiography;  Surgeon: Belva Crome, MD;  Location: [facility];  Service: Cardiovascular;  Laterality: N/A;  . LESION EXCISION Right 05/08/2015   Procedure: EXCISION SCALP LESION;  Surgeon: Erroll Luna, MD;  Location: [place];  Service: General;  Laterality: Right;  . MEMBRANE PEEL  05/10/2012   Procedure: MEMBRANE PEEL;  Surgeon: Hayden Pedro, MD;  Location: [place];  Service: Ophthalmology;  Laterality: Right;  . PARS PLANA VITRECTOMY  08/27/2011   Procedure: PARS PLANA VITRECTOMY WITH 25 GAUGE;  Surgeon: Hayden Pedro, MD;  Location: [facility];  Service: Ophthalmology;  Laterality: Left;  Repair of complex traction retinal detachment left eye  . PARS PLANA VITRECTOMY  05/10/2012   Procedure: PARS PLANA VITRECTOMY WITH 25 GAUGE;  Surgeon: Hayden Pedro, MD;  Location: [facility];  Service: Ophthalmology;   Laterality: Right;  Repair Complex Traction Retinal Detachment  . PHOTOCOAGULATION WITH LASER  05/10/2012   Procedure: PHOTOCOAGULATION WITH LASER;  Surgeon: Hayden Pedro, MD;  Location: [place] [facility];  Service: Ophthalmology;  Laterality: Right;  . PORT-A-CATH REMOVAL    . PORTACATH PLACEMENT    . SUBXYPHOID PERICARDIAL WINDOW N/A 01/10/2018   Procedure: SUBXYPHOID PERICARDIAL WINDOW;  Surgeon: Rexene Alberts, MD;  Location: [facility];  Service: Thoracic;  Laterality: N/A;  . TRANSMETATARSAL AMPUTATION Right 03/13/2016   Procedure: TRANSMETATARSAL AMPUTATION;  Surgeon: Edrick Kins, DPM;  Location: [facility];  Service: Podiatry;  Laterality: Right;  . VENA CAVA FILTER PLACEMENT  09/2012   due to preparation for surgery        Home Medications    Prior to Admission medications   Medication Sig Start Date End Date Taking? Authorizing Provider  acetaminophen (TYLENOL) 325 MG tablet Take  650 mg by mouth every 6 (six) hours as needed (pain).    [provider]  Cholecalciferol (VITAMIN D3) 50000 units CAPS Take 1 capsule by mouth daily.    [provider]  insulin aspart (NOVOLOG) 100 UNIT/ML injection Inject 1-9 Units into the skin 3 (three) times daily before meals. 01/21/18   Domenic Polite, MD  insulin glargine (LANTUS) 100 UNIT/ML injection Inject 0.15 mLs (15 Units total) into the skin at bedtime. 03/17/16   Theodis Blaze, MD  levofloxacin (LEVAQUIN) 500 MG tablet Take 1 tablet (500 mg total) by mouth every other day. Take after dialysis on dialysis days 07/17/18   Orpah Greek, MD  lidocaine-prilocaine (EMLA) cream Apply 1 application topically as needed (Apply small amount to access site 1-2 hours before dialysis. Cover with occlusive dressing (saran wrap)).     [provider]  midodrine (PROAMATINE) 5 MG tablet Take 5 mg by mouth 3 (three) times daily with meals. Take ONLY on DIALYSIS DAYS    [provider]  MULTIPLE VITAMINS-MINERALS PO Take by  mouth. 1 tablet PO daily    [provider]  multivitamin (RENA-VIT) TABS tablet Take 1 tablet by mouth at bedtime. 08/08/13   Burnard Bunting, MD  polysaccharide iron (NIFEREX) 150 MG CAPS capsule Take 1 capsule (150 mg total) by mouth daily. 01/16/11   Isaac Bliss, Rayford Halsted, MD  sevelamer carbonate (RENVELA) 800 MG tablet Take 1,600 mg by mouth 3 (three) times daily with meals.    [provider]  Skin Protectants, Misc. (MINERIN) CREA Apply topically. Topically every 12 hours    [provider]  traMADol (ULTRAM) 50 MG tablet Take 1 tablet (50 mg total) by mouth every 6 (six) hours as needed. 07/17/18   Orpah Greek, MD  vitamin C (ASCORBIC ACID) 500 MG tablet Take 500 mg by mouth 2 (two) times daily.    [provider]  zinc sulfate 220 (50 Zn) MG capsule Take 220 mg by mouth daily.    [provider]    Family History Family History  Problem Relation Age of Onset  . Anesthesia problems Son   . Hypertension Son   . Diabetes Father   . Hypertension Father   . Other Father        amputation  . Colon cancer Neg Hx     Social History Social History   Tobacco Use  . Smoking status: Never Smoker  . Smokeless tobacco: Never Used  Substance Use Topics  . Alcohol use: No    Alcohol/week: 0.0 standard drinks  . Drug use: No     Allergies   Patient has no known allergies.   Review of Systems Review of Systems  Respiratory: Positive for shortness of breath.   Cardiovascular: Positive for chest pain.  All other systems reviewed and are negative.    Physical Exam Updated Vital Signs BP (!) 157/69   Pulse 66   Temp 98.4 F (36.9 C) (Oral)   Resp 20   SpO2 98%   Physical Exam Vitals signs and nursing note reviewed.  Constitutional:      General: He is not in acute distress.    Appearance: Normal appearance. He is well-developed.  HENT:     Head: Normocephalic and atraumatic.     Right Ear: Hearing normal.      Left Ear: Hearing normal.     Nose: Nose normal.  Eyes:     Conjunctiva/sclera: Conjunctivae normal.  Pupils: Pupils are equal, round, and reactive to light.  Neck:     Musculoskeletal: Normal range of motion and neck supple.  Cardiovascular:     Rate and Rhythm: Regular rhythm.     Heart sounds: S1 normal and S2 normal. No murmur. No friction rub. No gallop.   Pulmonary:     Effort: Pulmonary effort is normal. No respiratory distress.     Breath sounds: Normal breath sounds.  Chest:     Chest wall: No tenderness.  Abdominal:     General: Bowel sounds are normal.     Palpations: Abdomen is soft.     Tenderness: There is no abdominal tenderness. There is no guarding or rebound. Negative signs include Murphy's sign and McBurney's sign.     Hernia: No hernia is present.  Musculoskeletal: Normal range of motion.  Skin:    General: Skin is warm and dry.     Findings: No rash.  Neurological:     Mental Status: He is alert and oriented to person, place, and time.     GCS: GCS eye subscore is 4. GCS verbal subscore is 5. GCS motor subscore is 6.     Cranial Nerves: No cranial nerve deficit.     Sensory: No sensory deficit.     Coordination: Coordination normal.  Psychiatric:        Speech: Speech normal.        Behavior: Behavior normal.        Thought Content: Thought content normal.      ED Treatments / Results  Labs (all labs ordered are listed, but only abnormal results are displayed) Labs Reviewed  BASIC METABOLIC PANEL - Abnormal; Notable for the following components:      Result Value   Sodium 134 (*)    Chloride 93 (*)    Glucose, Bld 135 (*)    Creatinine, Ser 4.22 (*)    Calcium 8.4 (*)    GFR calc non Af Amer 14 (*)    GFR calc Af Amer 16 (*)    All other components within normal limits  CBC - Abnormal; Notable for the following components:   RBC 3.56 (*)    Hemoglobin 10.7 (*)    HCT 32.6 (*)    RDW 17.2 (*)    Platelets 132 (*)    All other  components within normal limits  I-STAT TROPONIN, ED    EKG EKG Interpretation  Date/Time:  Saturday July 16 2018 22:52:06 EST Ventricular Rate:  87 PR Interval:    QRS Duration: 158 QT Interval:  474 QTC Calculation: 570 R Axis:   -69 Text Interpretation:  Atrial flutter with variable A-V block with premature ventricular or aberrantly conducted complexes Left axis deviation Right bundle branch block Left ventricular hypertrophy with QRS widening and repolarization abnormality Inferior infarct , age undetermined Abnormal ECG No significant change since last tracing Confirmed by Orpah Greek [phone]) on 07/17/2018 12:42:49 AM   Radiology Dg Chest 2 View  Result Date: 07/16/2018 CLINICAL DATA:  Shortness of breath and chest pain EXAM: CHEST - 2 VIEW COMPARISON:  January 21, 2018 FINDINGS: The heart size and mediastinal contours are stable. The heart size is enlarged. There is left lung base consolidation. There is a mild interstitial edema. The visualized skeletal structures are unremarkable. IMPRESSION: Left lung base pneumonia. Cardiomegaly with mild interstitial edema. Electronically Signed   By: Abelardo Diesel [name]   On: 07/16/2018 23:53    Procedures Procedures (including critical care  time)  Medications Ordered in ED Medications  sodium chloride flush (NS) 0.9 % injection 3 mL (has no administration in time range)  nitroGLYCERIN (NITROSTAT) SL tablet 0.4 mg (0.4 mg Sublingual Given 07/17/18 0032)  levofloxacin (LEVAQUIN) tablet 750 mg (has no administration in time range)  aspirin chewable tablet 324 mg (324 mg Oral Given 07/17/18 0026)     Initial Impression / Assessment and Plan / ED Course  I have reviewed the triage vital signs and the nursing notes.  Pertinent labs & imaging results that were available during my care of the patient were reviewed by me and considered in my medical decision making (see chart for details).     Presents to the emergency  department for evaluation of left-sided chest pain.  Symptoms began earlier today and have been intermittent.  He reports significant improvement without any intervention tonight.  Earlier today it resolved after taking Tylenol.  He did feel short of breath earlier.  He is not short of breath now but does report that he has had a cough.  Chest x-ray shows left lower lobe pneumonia.  This would explain his symptoms.  Symptoms do not seem cardiac.  EKG unchanged from previous.  Troponin negative.  Reviewing records he did have a heart catheterization a year ago that revealed widely patent coronary arteries, no CAD.  Based on this it is felt that the patient does not require any further work-up.  As he is not hypoxic, can treat outpatient.  Patient given Levaquin here in the ER will take Levaquin every 48 hours after dialysis, follow-up with PCP.  Final Clinical Impressions(s) / ED Diagnoses   Final diagnoses:  Community acquired pneumonia of left lower lobe of lung [facility])    ED Discharge Orders         Ordered    levofloxacin (LEVAQUIN) 500 MG tablet  Every other day     07/17/18 0156    traMADol (ULTRAM) 50 MG tablet  Every 6 hours PRN     07/17/18 0156           Orpah Greek, MD 07/17/18 0157

## 2018-07-19 DIAGNOSIS — E1129 Type 2 diabetes mellitus with other diabetic kidney complication: Secondary | ICD-10-CM | POA: Diagnosis not present

## 2018-07-19 DIAGNOSIS — N2581 Secondary hyperparathyroidism of renal origin: Secondary | ICD-10-CM | POA: Diagnosis not present

## 2018-07-19 DIAGNOSIS — E876 Hypokalemia: Secondary | ICD-10-CM | POA: Diagnosis not present

## 2018-07-19 DIAGNOSIS — N186 End stage renal disease: Secondary | ICD-10-CM | POA: Diagnosis not present

## 2018-07-20 DIAGNOSIS — I4891 Unspecified atrial fibrillation: Secondary | ICD-10-CM | POA: Diagnosis not present

## 2018-07-20 DIAGNOSIS — Z7901 Long term (current) use of anticoagulants: Secondary | ICD-10-CM | POA: Diagnosis not present

## 2018-07-21 DIAGNOSIS — E876 Hypokalemia: Secondary | ICD-10-CM | POA: Diagnosis not present

## 2018-07-21 DIAGNOSIS — N186 End stage renal disease: Secondary | ICD-10-CM | POA: Diagnosis not present

## 2018-07-21 DIAGNOSIS — N2581 Secondary hyperparathyroidism of renal origin: Secondary | ICD-10-CM | POA: Diagnosis not present

## 2018-07-21 DIAGNOSIS — E1129 Type 2 diabetes mellitus with other diabetic kidney complication: Secondary | ICD-10-CM | POA: Diagnosis not present

## 2018-07-23 DIAGNOSIS — N186 End stage renal disease: Secondary | ICD-10-CM | POA: Diagnosis not present

## 2018-07-23 DIAGNOSIS — E1129 Type 2 diabetes mellitus with other diabetic kidney complication: Secondary | ICD-10-CM | POA: Diagnosis not present

## 2018-07-23 DIAGNOSIS — E876 Hypokalemia: Secondary | ICD-10-CM | POA: Diagnosis not present

## 2018-07-23 DIAGNOSIS — N2581 Secondary hyperparathyroidism of renal origin: Secondary | ICD-10-CM | POA: Diagnosis not present

## 2018-07-25 ENCOUNTER — Encounter: Payer: Self-pay | Admitting: Cardiology

## 2018-07-25 ENCOUNTER — Ambulatory Visit (INDEPENDENT_AMBULATORY_CARE_PROVIDER_SITE_OTHER): Payer: Medicare Other | Admitting: Cardiology

## 2018-07-25 VITALS — BP 132/74 | HR 67 | Ht 75.5 in | Wt 250.0 lb

## 2018-07-25 DIAGNOSIS — I483 Typical atrial flutter: Secondary | ICD-10-CM | POA: Diagnosis not present

## 2018-07-25 NOTE — Progress Notes (Signed)
Electrophysiology Office Note   Date:  07/25/2018   ID:  Diante, Barley [birth year], MRN [id number]  PCP:  Burnard Bunting, MD  Primary Electrophysiologist:   [name], MD    No chief complaint on file.    History of Present Illness: [name] is a 68 y.o. male who presents today for electrophysiology evaluation.   He has a past history of CK D on dialysis, diabetes, DVT, hyperlipidemia, hypertension, and SVT. He was put on Coumadin for a DVT with filter placed in 2014. It appears that he potentially had a diagnosis of atrial fibrillation in 2016 while on his Coumadin. Recent echo showed severe biventricular dysfunction with an EF of 15-20%. He was in atrial fibrillation at the time of the echo. A Myoview showed a fixed medium size apical inferolateral perfusion defect that could represent infarction without ischemia. Had cardiac cath which showed patient coronary arteries. Since that time, he says that his chest pain has improved. He no longer has to be on oxygen when he is getting dialyzed. He does say that his blood pressure at times drops during dialysis, but usually has been stable. He gets his INRs checked at [facility] by his primary physician.   Today, denies symptoms of palpitations, chest pain, shortness of breath, orthopnea, PND, lower extremity edema, claudication, dizziness, presyncope, syncope, bleeding, or neurologic sequela. The patient is tolerating medications without difficulties.  He was admitted August 2019 with a hemorrhagic pericardial effusion.  He was taken off of his warfarin and had a pericardial window.  He is now back on his Coumadin is tolerating it well.  He went to the emergency room 07/16/2018 with chest pain and was found to have pneumonia.  He was put on antibiotics with improvement in his chest pain and respiratory status.    Past Medical History:  Diagnosis Date  . Anemia   . Arthritis    HNP- lumbar, "all over my body"  . Blood  transfusion    "years ago; blood was low" (08/05/2013)  . CKD (chronic kidney disease) stage 4, GFR 15-29 ml/min (HCC) 03/18/2012   [place]- T,TH,Sat.  . Diabetic nephropathy ([place])   . Diabetic retinopathy   . DVT (deep venous thrombosis) ([place])    "got one in my right leg now; I've had one before too, not sure which leg" (08/05/2013)  . ESRD (end stage renal disease) on dialysis [facility])    "just started today, (08/04/2013)"  . Family history of anesthesia complication    " my son wakes up slowly"  . GERD (gastroesophageal reflux disease)    uses alka seltzere on occas.   [name])    "one q now and then" (08/05/2013)  . Hyperlipidemia   . Hypertension   . 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 Non Hodgkin's lymphoma ([place])    Tx 2009; "had chemo; it went away" (08/05/2013)  . Noncompliance 03/16/2012  . NSVT (nonsustained ventricular tachycardia) ([place]) 03/18/2012  . Peripheral vascular disease ([place])   . Pneumonia 2013   hosp.-   . Poor historian    pt. unsure of several answers to health history questions   . Skin cancer    melanoma - head  . Sleep apnea    "suppose to have a sleep study, but they never told me when. (08/05/2013)   Past Surgical History:  Procedure Laterality Date  .  ACHILLES TENDON SURGERY Right 03/13/2016   Procedure: ACHILLES LENGTHENING/KIDNER;  Surgeon: Edrick Kins, DPM;  Location: [place];  Service: Podiatry;  Laterality: Right;  . AV FISTULA PLACEMENT Left 02/03/2013   Procedure: ARTERIOVENOUS (AV) FISTULA CREATION- LEFT RADIAL CEPHALIC; ULTRASOUND GUIDED;  Surgeon: Mal Misty, MD;  Location[facility] OR;  Service: Vascular;  Laterality: Left;  . AV FISTULA PLACEMENT Right 11/08/2015   Procedure: RIGHT BRACHIOCEPHALIC ARTERIOVENOUS (AV) FISTULA CREATION;  Surgeon: Serafina Mitchell, MD;  Location: [place];  Service: Vascular;  Laterality: Right;  . BASCILIC VEIN  TRANSPOSITION Right 01/24/2016   Procedure: RIGHT SECOND STAGE BASILIC VEIN TRANSPOSITION;  Surgeon: Angelia Mould, MD;  Location: [place];  Service: Vascular;  Laterality: Right;  . CARDIAC CATHETERIZATION    . COLONOSCOPY N/A 09/23/2015   Procedure: COLONOSCOPY;  Surgeon: Irene Shipper, MD;  Location: [facility];  Service: Endoscopy;  Laterality: N/A;  . Coloscopy    . EYE SURGERY Bilateral   . GAS INSERTION  05/10/2012   Procedure: INSERTION OF GAS;  Surgeon: Hayden Pedro, MD;  Location: [place];  Service: Ophthalmology;  Laterality: Right;  . LEFT HEART CATH AND CORONARY ANGIOGRAPHY N/A 09/11/2016   Procedure: Left Heart Cath and Coronary Angiography;  Surgeon: Belva Crome, MD;  Location: [place] CV LAB;  Service: Cardiovascular;  Laterality: N/A;  . LESION EXCISION Right 05/08/2015   Procedure: EXCISION SCALP LESION;  Surgeon: Erroll Luna, MD;  Location: [place];  Service: General;  Laterality: Right;  . MEMBRANE PEEL  05/10/2012   Procedure: MEMBRANE PEEL;  Surgeon: Hayden Pedro, MD;  Location: [place];  Service: Ophthalmology;  Laterality: Right;  . PARS PLANA VITRECTOMY  08/27/2011   Procedure: PARS PLANA VITRECTOMY WITH 25 GAUGE;  Surgeon: Hayden Pedro, MD;  Location: [place];  Service: Ophthalmology;  Laterality: Left;  Repair of complex traction retinal detachment left eye  . PARS PLANA VITRECTOMY  05/10/2012   Procedure: PARS PLANA VITRECTOMY WITH 25 GAUGE;  Surgeon: Hayden Pedro, MD;  Location: [place];  Service: Ophthalmology;  Laterality: Right;  Repair Complex Traction Retinal Detachment  . PHOTOCOAGULATION WITH LASER  05/10/2012   Procedure: PHOTOCOAGULATION WITH LASER;  Surgeon: Hayden Pedro, MD;  Location: [place];  Service: Ophthalmology;  Laterality: Right;  . PORT-A-CATH REMOVAL    . PORTACATH PLACEMENT    . SUBXYPHOID PERICARDIAL WINDOW N/A 01/10/2018   Procedure: SUBXYPHOID PERICARDIAL WINDOW;  Surgeon: Rexene Alberts, MD;  Location: [place];  Service:  Thoracic;  Laterality: N/A;  . TRANSMETATARSAL AMPUTATION Right 03/13/2016   Procedure: TRANSMETATARSAL AMPUTATION;  Surgeon: Edrick Kins, DPM;  Location: [place];  Service: Podiatry;  Laterality: Right;  . VENA CAVA FILTER PLACEMENT  09/2012   due to preparation for surgery     Current Outpatient Medications  Medication Sig Dispense Refill  . acetaminophen (TYLENOL) 325 MG tablet Take 650 mg by mouth every 6 (six) hours as needed (pain).    . Cholecalciferol (VITAMIN D3) 50000 units CAPS Take 1 capsule by mouth daily.    . insulin aspart (NOVOLOG) 100 UNIT/ML injection Inject 1-9 Units into the skin 3 (three) times daily before meals.    . insulin glargine (LANTUS) 100 UNIT/ML injection Inject 0.15 mLs (15 Units total) into the skin at bedtime. 10 mL 11  . levofloxacin (LEVAQUIN) 500 MG tablet Take 1 tablet (500 mg total) by mouth every other day. Take after dialysis on dialysis days 4 tablet 0  .  lidocaine-prilocaine (EMLA) cream Apply 1 application topically as needed (Apply small amount to access site 1-2 hours before dialysis. Cover with occlusive dressing (saran wrap)).     . midodrine (PROAMATINE) 5 MG tablet Take 5 mg by mouth 3 (three) times daily with meals. Take ONLY on DIALYSIS DAYS    . MULTIPLE VITAMINS-MINERALS PO Take by mouth. 1 tablet PO daily    . multivitamin (RENA-VIT) TABS tablet Take 1 tablet by mouth at bedtime. 30 tablet 0  . polysaccharide iron (NIFEREX) 150 MG CAPS capsule Take 1 capsule (150 mg total) by mouth daily. 30 each 2  . sevelamer carbonate (RENVELA) 800 MG tablet Take 1,600 mg by mouth 3 (three) times daily with meals.    . Skin Protectants, Misc. (MINERIN) CREA Apply topically. Topically every 12 hours    . traMADol (ULTRAM) 50 MG tablet Take 1 tablet (50 mg total) by mouth every 6 (six) hours as needed. 10 tablet 0  . vitamin C (ASCORBIC ACID) 500 MG tablet Take 500 mg by mouth 2 (two) times daily.    Marland Kitchen zinc sulfate 220 (50 Zn) MG capsule Take 220 mg by  mouth daily.     No current facility-administered medications for this visit.     Allergies:   Patient has no known allergies.   Social History:  The patient  reports that he has never smoked. He has never used smokeless tobacco. He reports that he does not drink alcohol or use drugs.   Family History:  The patient's family history includes Anesthesia problems in his son; Diabetes in his father; Hypertension in his father and son; Other in his father.    ROS:  Please see the history of present illness.   Otherwise, review of systems is positive for none.   All other systems are reviewed and negative.   PHYSICAL EXAM: VS:  There were no vitals taken for this visit. , BMI There is no height or weight on file to calculate BMI. GEN: Well nourished, well developed, in no acute distress  HEENT: normal  Neck: no JVD, carotid bruits, or masses Cardiac: RRR; no murmurs, rubs, or gallops,no edema  Respiratory:  clear to auscultation bilaterally, normal work of breathing GI: soft, nontender, nondistended, + BS MS: no deformity or atrophy  Skin: warm and dry Neuro:  Strength and sensation are intact Psych: euthymic mood, full affect  EKG:  EKG is ordered today. Personal review of the ekg ordered shows atrial flutter, right bundle branch block, left anterior fascicular block, rate 67  Recent Labs: 01/09/2018: B Natriuretic Peptide 276.0 01/14/2018: Magnesium 2.7 01/17/2018: ALT 840 07/16/2018: BUN 17; Creatinine, Ser 4.22; Hemoglobin 10.7; Platelets 132; Potassium 3.8; Sodium 134    Lipid Panel     Component Value Date/Time   CHOL 129 01/14/2011 0437   TRIG 137 01/14/2011 0437   HDL 13 (L) 01/14/2011 0437   CHOLHDL 9.9 01/14/2011 0437   VLDL 27 01/14/2011 0437   LDLCALC 89 01/14/2011 0437     Wt Readings from Last 3 Encounters:  03/09/18 242 lb 9.6 oz (110 kg)  03/07/18 243 lb (110.2 kg)  02/02/18 243 lb (110.2 kg)      Other studies Reviewed: Additional studies/ records that  were reviewed today include: Myoview 08/14/16 Review of the above records today demonstrates:    Nuclear stress EF: 29%.  There was no ST segment deviation noted during stress.  This is a high risk study.  The left ventricular ejection fraction is severely decreased (<  30%).   1. EF 29%, diffuse hypokinesis.  2. Fixed, medium-sized, mild-intensity basal to apical inferolateral perfusion defect.  This could represent prior myocardial infarction, no ischemia.  3. High risk study primarily due to low EF.   TTE 01/14/18 - Left ventricle: The cavity size was normal. Wall thickness was   increased in a pattern of moderate LVH. Systolic function was   moderately to severely reduced. The estimated ejection fraction   was in the range of 30% to 35%. Diffuse hypokinesis with inferior   akinesis. The study was not technically sufficient to allow   evaluation of LV diastolic dysfunction due to atrial   fibrillation. - Aortic valve: There was no stenosis. - Mitral valve: There was no significant regurgitation. - Right ventricle: The cavity size was mildly dilated. Systolic   function was mildly to moderately reduced. - Tricuspid valve: Peak RV-RA gradient (S): 29 mm Hg. - Pulmonary arteries: PA peak pressure: 44 mm Hg (S). - Systemic veins: IVC measured 2.4 cm with < 50% respirophasic   variation, suggesting RA pressure 15 mmHg. - Pericardium, extracardiac: Pleural effusion noted. There was no   pericardial effusion.  Cardiac cath 09/11/16  Widely patent coronary arteries with luminal irregularities in the LAD and right coronary.  Severe left ventricular systolic dysfunction with global wall motion abnormality and an EF less than 25%.  Hemostasis achieved with Perclose in the right femoral.  CXR 07/16/18 - personally reviewed Left lung base pneumonia. Cardiomegaly with mild interstitial edema.  ASSESSMENT AND PLAN:  1.  Atrial fibrillation/atrial flutter: Currently on Coumadin.  His  INRs are checked by his primary physician.  He is in atrial flutter today.  He did have a hemorrhagic pericardial effusion which required pericardial window.  This was in August 2019.  He is asymptomatic from his atrial flutter.  No plans for rhythm control at this time.  This patients CHA2DS2-VASc Score and unadjusted Ischemic Stroke Rate (% per year) is equal to 3.2 % stroke rate/year from a score of 3  Above score calculated as 1 point each if present [CHF, HTN, DM, Vascular=MI/PAD/Aortic Plaque, Age if 65-74, or Male] Above score calculated as 2 points each if present [Age > 75, or Stroke/TIA/TE]   2. Hypertension: Well-controlled today.  He takes Midrin with his dialysis.  3. Chest pain: Catheterization with no evidence of coronary artery disease.  He was diagnosed with pneumonia in the emergency room.  His chest pain is improved with antibiotics.  4. Chronic systolic heart failure due to nonischemic cardiomyopathy: Currently on metoprolol.  He has not been able to tolerate other medications due to hypotension with dialysis.  No changes at this time.  Current medicines are reviewed at length with the patient today.   The patient does not have concerns regarding his medicines.  The following changes were made today: None  Labs/ tests ordered today include:  No orders of the defined types were placed in this encounter.    Disposition:   FU with   6 months  Signed,  [name], MD  07/25/2018 8:25 AM     [facility] [address] [place] [address] [phone] (office) [phone] (fax)

## 2018-07-25 NOTE — Patient Instructions (Signed)
Medication Instructions:  Your physician recommends that you continue on your current medications as directed. Please refer to the Current Medication list given to you today.  If you need a refill on your cardiac medications before your next appointment, please call your pharmacy.   Labwork: None ordered  Testing/Procedures: None ordered  Follow-Up: Your physician wants you to follow-up in: 6 months with Dr. Camnitz.  You will receive a reminder letter in the mail two months in advance. If you don't receive a letter, please call our office to schedule the follow-up appointment.  Thank you for choosing [facility]!!   Helana Macbride, RN [phone]         

## 2018-07-26 DIAGNOSIS — E876 Hypokalemia: Secondary | ICD-10-CM | POA: Diagnosis not present

## 2018-07-26 DIAGNOSIS — N2581 Secondary hyperparathyroidism of renal origin: Secondary | ICD-10-CM | POA: Diagnosis not present

## 2018-07-26 DIAGNOSIS — E1129 Type 2 diabetes mellitus with other diabetic kidney complication: Secondary | ICD-10-CM | POA: Diagnosis not present

## 2018-07-26 DIAGNOSIS — N186 End stage renal disease: Secondary | ICD-10-CM | POA: Diagnosis not present

## 2018-07-28 DIAGNOSIS — E1129 Type 2 diabetes mellitus with other diabetic kidney complication: Secondary | ICD-10-CM | POA: Diagnosis not present

## 2018-07-28 DIAGNOSIS — N186 End stage renal disease: Secondary | ICD-10-CM | POA: Diagnosis not present

## 2018-07-28 DIAGNOSIS — N2581 Secondary hyperparathyroidism of renal origin: Secondary | ICD-10-CM | POA: Diagnosis not present

## 2018-07-28 DIAGNOSIS — E876 Hypokalemia: Secondary | ICD-10-CM | POA: Diagnosis not present

## 2018-07-29 DIAGNOSIS — J181 Lobar pneumonia, unspecified organism: Secondary | ICD-10-CM | POA: Diagnosis not present

## 2018-07-29 DIAGNOSIS — Z7901 Long term (current) use of anticoagulants: Secondary | ICD-10-CM | POA: Diagnosis not present

## 2018-07-29 DIAGNOSIS — R05 Cough: Secondary | ICD-10-CM | POA: Diagnosis not present

## 2018-07-29 DIAGNOSIS — J09X2 Influenza due to identified novel influenza A virus with other respiratory manifestations: Secondary | ICD-10-CM | POA: Diagnosis not present

## 2018-07-29 DIAGNOSIS — I4891 Unspecified atrial fibrillation: Secondary | ICD-10-CM | POA: Diagnosis not present

## 2018-07-30 DIAGNOSIS — N2581 Secondary hyperparathyroidism of renal origin: Secondary | ICD-10-CM | POA: Diagnosis not present

## 2018-07-30 DIAGNOSIS — E876 Hypokalemia: Secondary | ICD-10-CM | POA: Diagnosis not present

## 2018-07-30 DIAGNOSIS — E1129 Type 2 diabetes mellitus with other diabetic kidney complication: Secondary | ICD-10-CM | POA: Diagnosis not present

## 2018-07-30 DIAGNOSIS — N186 End stage renal disease: Secondary | ICD-10-CM | POA: Diagnosis not present

## 2018-07-31 DIAGNOSIS — Z992 Dependence on renal dialysis: Secondary | ICD-10-CM | POA: Diagnosis not present

## 2018-07-31 DIAGNOSIS — N186 End stage renal disease: Secondary | ICD-10-CM | POA: Diagnosis not present

## 2018-07-31 DIAGNOSIS — E1129 Type 2 diabetes mellitus with other diabetic kidney complication: Secondary | ICD-10-CM | POA: Diagnosis not present

## 2018-08-02 DIAGNOSIS — N2581 Secondary hyperparathyroidism of renal origin: Secondary | ICD-10-CM | POA: Diagnosis not present

## 2018-08-02 DIAGNOSIS — D631 Anemia in chronic kidney disease: Secondary | ICD-10-CM | POA: Diagnosis not present

## 2018-08-02 DIAGNOSIS — E1129 Type 2 diabetes mellitus with other diabetic kidney complication: Secondary | ICD-10-CM | POA: Diagnosis not present

## 2018-08-02 DIAGNOSIS — E876 Hypokalemia: Secondary | ICD-10-CM | POA: Diagnosis not present

## 2018-08-02 DIAGNOSIS — N186 End stage renal disease: Secondary | ICD-10-CM | POA: Diagnosis not present

## 2018-08-04 DIAGNOSIS — N2581 Secondary hyperparathyroidism of renal origin: Secondary | ICD-10-CM | POA: Diagnosis not present

## 2018-08-04 DIAGNOSIS — N186 End stage renal disease: Secondary | ICD-10-CM | POA: Diagnosis not present

## 2018-08-04 DIAGNOSIS — E1129 Type 2 diabetes mellitus with other diabetic kidney complication: Secondary | ICD-10-CM | POA: Diagnosis not present

## 2018-08-04 DIAGNOSIS — D631 Anemia in chronic kidney disease: Secondary | ICD-10-CM | POA: Diagnosis not present

## 2018-08-04 DIAGNOSIS — E876 Hypokalemia: Secondary | ICD-10-CM | POA: Diagnosis not present

## 2018-08-06 DIAGNOSIS — E1129 Type 2 diabetes mellitus with other diabetic kidney complication: Secondary | ICD-10-CM | POA: Diagnosis not present

## 2018-08-06 DIAGNOSIS — D631 Anemia in chronic kidney disease: Secondary | ICD-10-CM | POA: Diagnosis not present

## 2018-08-06 DIAGNOSIS — N186 End stage renal disease: Secondary | ICD-10-CM | POA: Diagnosis not present

## 2018-08-06 DIAGNOSIS — N2581 Secondary hyperparathyroidism of renal origin: Secondary | ICD-10-CM | POA: Diagnosis not present

## 2018-08-06 DIAGNOSIS — E876 Hypokalemia: Secondary | ICD-10-CM | POA: Diagnosis not present

## 2018-08-09 DIAGNOSIS — E1129 Type 2 diabetes mellitus with other diabetic kidney complication: Secondary | ICD-10-CM | POA: Diagnosis not present

## 2018-08-09 DIAGNOSIS — N186 End stage renal disease: Secondary | ICD-10-CM | POA: Diagnosis not present

## 2018-08-09 DIAGNOSIS — D631 Anemia in chronic kidney disease: Secondary | ICD-10-CM | POA: Diagnosis not present

## 2018-08-09 DIAGNOSIS — N2581 Secondary hyperparathyroidism of renal origin: Secondary | ICD-10-CM | POA: Diagnosis not present

## 2018-08-09 DIAGNOSIS — E876 Hypokalemia: Secondary | ICD-10-CM | POA: Diagnosis not present

## 2018-08-11 DIAGNOSIS — N2581 Secondary hyperparathyroidism of renal origin: Secondary | ICD-10-CM | POA: Diagnosis not present

## 2018-08-11 DIAGNOSIS — N186 End stage renal disease: Secondary | ICD-10-CM | POA: Diagnosis not present

## 2018-08-11 DIAGNOSIS — E876 Hypokalemia: Secondary | ICD-10-CM | POA: Diagnosis not present

## 2018-08-11 DIAGNOSIS — E1129 Type 2 diabetes mellitus with other diabetic kidney complication: Secondary | ICD-10-CM | POA: Diagnosis not present

## 2018-08-11 DIAGNOSIS — D631 Anemia in chronic kidney disease: Secondary | ICD-10-CM | POA: Diagnosis not present

## 2018-08-13 DIAGNOSIS — N2581 Secondary hyperparathyroidism of renal origin: Secondary | ICD-10-CM | POA: Diagnosis not present

## 2018-08-13 DIAGNOSIS — E1129 Type 2 diabetes mellitus with other diabetic kidney complication: Secondary | ICD-10-CM | POA: Diagnosis not present

## 2018-08-13 DIAGNOSIS — N186 End stage renal disease: Secondary | ICD-10-CM | POA: Diagnosis not present

## 2018-08-13 DIAGNOSIS — D631 Anemia in chronic kidney disease: Secondary | ICD-10-CM | POA: Diagnosis not present

## 2018-08-13 DIAGNOSIS — E876 Hypokalemia: Secondary | ICD-10-CM | POA: Diagnosis not present

## 2018-08-16 DIAGNOSIS — E1129 Type 2 diabetes mellitus with other diabetic kidney complication: Secondary | ICD-10-CM | POA: Diagnosis not present

## 2018-08-16 DIAGNOSIS — N2581 Secondary hyperparathyroidism of renal origin: Secondary | ICD-10-CM | POA: Diagnosis not present

## 2018-08-16 DIAGNOSIS — N186 End stage renal disease: Secondary | ICD-10-CM | POA: Diagnosis not present

## 2018-08-16 DIAGNOSIS — D631 Anemia in chronic kidney disease: Secondary | ICD-10-CM | POA: Diagnosis not present

## 2018-08-16 DIAGNOSIS — E876 Hypokalemia: Secondary | ICD-10-CM | POA: Diagnosis not present

## 2018-08-17 ENCOUNTER — Ambulatory Visit: Payer: Medicare Other | Admitting: Podiatry

## 2018-08-18 DIAGNOSIS — N186 End stage renal disease: Secondary | ICD-10-CM | POA: Diagnosis not present

## 2018-08-18 DIAGNOSIS — D631 Anemia in chronic kidney disease: Secondary | ICD-10-CM | POA: Diagnosis not present

## 2018-08-18 DIAGNOSIS — E1129 Type 2 diabetes mellitus with other diabetic kidney complication: Secondary | ICD-10-CM | POA: Diagnosis not present

## 2018-08-18 DIAGNOSIS — E876 Hypokalemia: Secondary | ICD-10-CM | POA: Diagnosis not present

## 2018-08-18 DIAGNOSIS — N2581 Secondary hyperparathyroidism of renal origin: Secondary | ICD-10-CM | POA: Diagnosis not present

## 2018-08-20 DIAGNOSIS — N186 End stage renal disease: Secondary | ICD-10-CM | POA: Diagnosis not present

## 2018-08-20 DIAGNOSIS — D631 Anemia in chronic kidney disease: Secondary | ICD-10-CM | POA: Diagnosis not present

## 2018-08-20 DIAGNOSIS — N2581 Secondary hyperparathyroidism of renal origin: Secondary | ICD-10-CM | POA: Diagnosis not present

## 2018-08-20 DIAGNOSIS — E1129 Type 2 diabetes mellitus with other diabetic kidney complication: Secondary | ICD-10-CM | POA: Diagnosis not present

## 2018-08-20 DIAGNOSIS — E876 Hypokalemia: Secondary | ICD-10-CM | POA: Diagnosis not present

## 2018-08-23 DIAGNOSIS — E876 Hypokalemia: Secondary | ICD-10-CM | POA: Diagnosis not present

## 2018-08-23 DIAGNOSIS — N186 End stage renal disease: Secondary | ICD-10-CM | POA: Diagnosis not present

## 2018-08-23 DIAGNOSIS — E1129 Type 2 diabetes mellitus with other diabetic kidney complication: Secondary | ICD-10-CM | POA: Diagnosis not present

## 2018-08-23 DIAGNOSIS — D631 Anemia in chronic kidney disease: Secondary | ICD-10-CM | POA: Diagnosis not present

## 2018-08-23 DIAGNOSIS — N2581 Secondary hyperparathyroidism of renal origin: Secondary | ICD-10-CM | POA: Diagnosis not present

## 2018-08-25 DIAGNOSIS — D631 Anemia in chronic kidney disease: Secondary | ICD-10-CM | POA: Diagnosis not present

## 2018-08-25 DIAGNOSIS — E1129 Type 2 diabetes mellitus with other diabetic kidney complication: Secondary | ICD-10-CM | POA: Diagnosis not present

## 2018-08-25 DIAGNOSIS — N2581 Secondary hyperparathyroidism of renal origin: Secondary | ICD-10-CM | POA: Diagnosis not present

## 2018-08-25 DIAGNOSIS — E876 Hypokalemia: Secondary | ICD-10-CM | POA: Diagnosis not present

## 2018-08-25 DIAGNOSIS — N186 End stage renal disease: Secondary | ICD-10-CM | POA: Diagnosis not present

## 2018-08-27 DIAGNOSIS — D631 Anemia in chronic kidney disease: Secondary | ICD-10-CM | POA: Diagnosis not present

## 2018-08-27 DIAGNOSIS — E1129 Type 2 diabetes mellitus with other diabetic kidney complication: Secondary | ICD-10-CM | POA: Diagnosis not present

## 2018-08-27 DIAGNOSIS — N2581 Secondary hyperparathyroidism of renal origin: Secondary | ICD-10-CM | POA: Diagnosis not present

## 2018-08-27 DIAGNOSIS — N186 End stage renal disease: Secondary | ICD-10-CM | POA: Diagnosis not present

## 2018-08-27 DIAGNOSIS — E876 Hypokalemia: Secondary | ICD-10-CM | POA: Diagnosis not present

## 2018-08-30 DIAGNOSIS — N2581 Secondary hyperparathyroidism of renal origin: Secondary | ICD-10-CM | POA: Diagnosis not present

## 2018-08-30 DIAGNOSIS — N186 End stage renal disease: Secondary | ICD-10-CM | POA: Diagnosis not present

## 2018-08-30 DIAGNOSIS — E1129 Type 2 diabetes mellitus with other diabetic kidney complication: Secondary | ICD-10-CM | POA: Diagnosis not present

## 2018-08-30 DIAGNOSIS — E876 Hypokalemia: Secondary | ICD-10-CM | POA: Diagnosis not present

## 2018-08-30 DIAGNOSIS — D631 Anemia in chronic kidney disease: Secondary | ICD-10-CM | POA: Diagnosis not present

## 2018-08-31 DIAGNOSIS — E1129 Type 2 diabetes mellitus with other diabetic kidney complication: Secondary | ICD-10-CM | POA: Diagnosis not present

## 2018-08-31 DIAGNOSIS — Z992 Dependence on renal dialysis: Secondary | ICD-10-CM | POA: Diagnosis not present

## 2018-08-31 DIAGNOSIS — N186 End stage renal disease: Secondary | ICD-10-CM | POA: Diagnosis not present

## 2018-09-01 DIAGNOSIS — E876 Hypokalemia: Secondary | ICD-10-CM | POA: Diagnosis not present

## 2018-09-01 DIAGNOSIS — E1129 Type 2 diabetes mellitus with other diabetic kidney complication: Secondary | ICD-10-CM | POA: Diagnosis not present

## 2018-09-01 DIAGNOSIS — N186 End stage renal disease: Secondary | ICD-10-CM | POA: Diagnosis not present

## 2018-09-01 DIAGNOSIS — N2581 Secondary hyperparathyroidism of renal origin: Secondary | ICD-10-CM | POA: Diagnosis not present

## 2018-09-01 DIAGNOSIS — D631 Anemia in chronic kidney disease: Secondary | ICD-10-CM | POA: Diagnosis not present

## 2018-09-02 ENCOUNTER — Encounter: Payer: Self-pay | Admitting: Internal Medicine

## 2018-09-03 DIAGNOSIS — N186 End stage renal disease: Secondary | ICD-10-CM | POA: Diagnosis not present

## 2018-09-03 DIAGNOSIS — N2581 Secondary hyperparathyroidism of renal origin: Secondary | ICD-10-CM | POA: Diagnosis not present

## 2018-09-03 DIAGNOSIS — E1129 Type 2 diabetes mellitus with other diabetic kidney complication: Secondary | ICD-10-CM | POA: Diagnosis not present

## 2018-09-03 DIAGNOSIS — D631 Anemia in chronic kidney disease: Secondary | ICD-10-CM | POA: Diagnosis not present

## 2018-09-03 DIAGNOSIS — E876 Hypokalemia: Secondary | ICD-10-CM | POA: Diagnosis not present

## 2018-09-06 DIAGNOSIS — D631 Anemia in chronic kidney disease: Secondary | ICD-10-CM | POA: Diagnosis not present

## 2018-09-06 DIAGNOSIS — E1129 Type 2 diabetes mellitus with other diabetic kidney complication: Secondary | ICD-10-CM | POA: Diagnosis not present

## 2018-09-06 DIAGNOSIS — N2581 Secondary hyperparathyroidism of renal origin: Secondary | ICD-10-CM | POA: Diagnosis not present

## 2018-09-06 DIAGNOSIS — N186 End stage renal disease: Secondary | ICD-10-CM | POA: Diagnosis not present

## 2018-09-06 DIAGNOSIS — E876 Hypokalemia: Secondary | ICD-10-CM | POA: Diagnosis not present

## 2018-09-08 DIAGNOSIS — N186 End stage renal disease: Secondary | ICD-10-CM | POA: Diagnosis not present

## 2018-09-08 DIAGNOSIS — N2581 Secondary hyperparathyroidism of renal origin: Secondary | ICD-10-CM | POA: Diagnosis not present

## 2018-09-08 DIAGNOSIS — E1129 Type 2 diabetes mellitus with other diabetic kidney complication: Secondary | ICD-10-CM | POA: Diagnosis not present

## 2018-09-08 DIAGNOSIS — D631 Anemia in chronic kidney disease: Secondary | ICD-10-CM | POA: Diagnosis not present

## 2018-09-08 DIAGNOSIS — E876 Hypokalemia: Secondary | ICD-10-CM | POA: Diagnosis not present

## 2018-09-10 DIAGNOSIS — E1129 Type 2 diabetes mellitus with other diabetic kidney complication: Secondary | ICD-10-CM | POA: Diagnosis not present

## 2018-09-10 DIAGNOSIS — D631 Anemia in chronic kidney disease: Secondary | ICD-10-CM | POA: Diagnosis not present

## 2018-09-10 DIAGNOSIS — N2581 Secondary hyperparathyroidism of renal origin: Secondary | ICD-10-CM | POA: Diagnosis not present

## 2018-09-10 DIAGNOSIS — N186 End stage renal disease: Secondary | ICD-10-CM | POA: Diagnosis not present

## 2018-09-10 DIAGNOSIS — E876 Hypokalemia: Secondary | ICD-10-CM | POA: Diagnosis not present

## 2018-09-13 DIAGNOSIS — E1129 Type 2 diabetes mellitus with other diabetic kidney complication: Secondary | ICD-10-CM | POA: Diagnosis not present

## 2018-09-13 DIAGNOSIS — D631 Anemia in chronic kidney disease: Secondary | ICD-10-CM | POA: Diagnosis not present

## 2018-09-13 DIAGNOSIS — N186 End stage renal disease: Secondary | ICD-10-CM | POA: Diagnosis not present

## 2018-09-13 DIAGNOSIS — N2581 Secondary hyperparathyroidism of renal origin: Secondary | ICD-10-CM | POA: Diagnosis not present

## 2018-09-13 DIAGNOSIS — E876 Hypokalemia: Secondary | ICD-10-CM | POA: Diagnosis not present

## 2018-09-14 ENCOUNTER — Encounter: Payer: Self-pay | Admitting: Podiatry

## 2018-09-14 ENCOUNTER — Ambulatory Visit (INDEPENDENT_AMBULATORY_CARE_PROVIDER_SITE_OTHER): Payer: Medicare Other | Admitting: Podiatry

## 2018-09-14 ENCOUNTER — Other Ambulatory Visit: Payer: Self-pay

## 2018-09-14 VITALS — Temp 97.2°F

## 2018-09-14 DIAGNOSIS — Z89431 Acquired absence of right foot: Secondary | ICD-10-CM

## 2018-09-14 DIAGNOSIS — B351 Tinea unguium: Secondary | ICD-10-CM | POA: Diagnosis not present

## 2018-09-14 DIAGNOSIS — M79676 Pain in unspecified toe(s): Secondary | ICD-10-CM | POA: Diagnosis not present

## 2018-09-14 DIAGNOSIS — I4891 Unspecified atrial fibrillation: Secondary | ICD-10-CM | POA: Diagnosis not present

## 2018-09-14 DIAGNOSIS — E0842 Diabetes mellitus due to underlying condition with diabetic polyneuropathy: Secondary | ICD-10-CM

## 2018-09-14 DIAGNOSIS — I739 Peripheral vascular disease, unspecified: Secondary | ICD-10-CM

## 2018-09-14 DIAGNOSIS — Z7901 Long term (current) use of anticoagulants: Secondary | ICD-10-CM | POA: Diagnosis not present

## 2018-09-14 NOTE — Progress Notes (Signed)
Complaint:  Visit Type: Patient returns to my office for continued preventative foot care services. Complaint: Patient states" my nails have grown long and thick and become painful to walk and wear shoes on his left foot.  Patient has been diagnosed with DM with angiopathy.  Patient has had transmetatarsal amputation performed toes right foot.. The patient presents for preventative foot care services. No changes to ROS  Podiatric Exam: Vascular: dorsalis pedis and posterior tibial pulses are not  palpable bilateral... Capillary return is immediate. Temperature gradient is WNL. Skin turgor WNL  Significant swelling feet  B/L. Sensorium: Normal Semmes Weinstein monofilament test. Normal tactile sensation left foot. Nail Exam: Pt has thick disfigured discolored nails with subungual debris noted  entire nail hallux through fifth toenails left foot. Ulcer Exam: There is no evidence of ulcer or pre-ulcerative changes or infection. Orthopedic Exam: Muscle tone and strength are WNL. No limitations in general ROM. No crepitus or effusions noted. Foot type and digits show no abnormalities. Bony prominences are unremarkable. TMA right foot. Skin: No Porokeratosis. No infection or ulcers  Diagnosis:  Onychomycosis, ,, pain in left toes,  TMA right foot.  Treatment & Plan Procedures and Treatment: Consent by patient was obtained for treatment procedures.   Debridement of mycotic and hypertrophic toenails, 1 through 5 left foot  and clearing of subungual debris. No ulceration, no infection noted.  Return Visit-Office Procedure: Patient instructed to return to the office for a follow up visit 3 months for continued evaluation and treatment.    Gardiner Barefoot DPM

## 2018-09-15 ENCOUNTER — Telehealth (INDEPENDENT_AMBULATORY_CARE_PROVIDER_SITE_OTHER): Payer: Medicare Other | Admitting: Internal Medicine

## 2018-09-15 ENCOUNTER — Encounter: Payer: Self-pay | Admitting: Internal Medicine

## 2018-09-15 VITALS — BP 104/66 | HR 98 | Ht 75.5 in | Wt 245.0 lb

## 2018-09-15 DIAGNOSIS — E876 Hypokalemia: Secondary | ICD-10-CM | POA: Diagnosis not present

## 2018-09-15 DIAGNOSIS — D631 Anemia in chronic kidney disease: Secondary | ICD-10-CM | POA: Diagnosis not present

## 2018-09-15 DIAGNOSIS — I428 Other cardiomyopathies: Secondary | ICD-10-CM

## 2018-09-15 DIAGNOSIS — N2581 Secondary hyperparathyroidism of renal origin: Secondary | ICD-10-CM | POA: Diagnosis not present

## 2018-09-15 DIAGNOSIS — N186 End stage renal disease: Secondary | ICD-10-CM

## 2018-09-15 DIAGNOSIS — I480 Paroxysmal atrial fibrillation: Secondary | ICD-10-CM | POA: Diagnosis not present

## 2018-09-15 DIAGNOSIS — E1129 Type 2 diabetes mellitus with other diabetic kidney complication: Secondary | ICD-10-CM | POA: Diagnosis not present

## 2018-09-15 DIAGNOSIS — I5022 Chronic systolic (congestive) heart failure: Secondary | ICD-10-CM | POA: Diagnosis not present

## 2018-09-15 DIAGNOSIS — Z992 Dependence on renal dialysis: Secondary | ICD-10-CM | POA: Diagnosis not present

## 2018-09-15 DIAGNOSIS — Z7901 Long term (current) use of anticoagulants: Secondary | ICD-10-CM

## 2018-09-15 DIAGNOSIS — I959 Hypotension, unspecified: Secondary | ICD-10-CM

## 2018-09-15 NOTE — Patient Instructions (Signed)
Medication Instructions:  Your Physician recommend you continue on your current medication as directed.    If you need a refill on your cardiac medications before your next appointment, please call your pharmacy.   Lab work: None  Testing/Procedures: Your physician has requested that you have an echocardiogram in 6 months. Echocardiography is a painless test that uses sound waves to create images of your heart. It provides your doctor with information about the size and shape of your heart and how well your heart's chambers and valves are working. This procedure takes approximately one hour. There are no restrictions for this procedure. [place] [address]   Follow-Up: Your physician recommends that you schedule a follow-up appointment in 6 months with Dr. Debara Pickett.

## 2018-09-15 NOTE — Progress Notes (Signed)
Virtual Visit via Telephone Note   This visit type was conducted due to national recommendations for restrictions regarding the COVID-19 Pandemic (e.g. social distancing) in an effort to limit this patient's exposure and mitigate transmission in our community.  Due to his co-morbid illnesses, this patient is at least at moderate risk for complications without adequate follow up.  This format is felt to be most appropriate for this patient at this time.  The patient did not have access to video technology/had technical difficulties with video requiring transitioning to audio format only (telephone).  All issues noted in this document were discussed and addressed.  No physical exam could be performed with this format.  Please refer to the patient's chart for his  consent to telehealth for [facility]   Evaluation Performed:  Telephone visit  Date:  09/15/2018   ID:  John, [name] [birth year], MRN [id number]  Patient Location:  [address] [place] [address]  Provider location:   [address] [place], [place] [address]  PCP:  Burnard Bunting, MD  Cardiologist:  Pixie Casino, MD Electrophysiologist:  Will [name], MD   Chief Complaint:  Follow-up  History of Present Illness:    [name] is a 68 y.o. male who presents via audio/video conferencing for a telehealth visit today.  [name] is a 68 y.o. male with a past medial history significant for end-stage renal disease on dialysis, diabetic nephropathy and retinopathy, history of DVT, and recent admission for diffuse abdominal pain and hypotension, subsequently found to have a large pericardial effusion.  Was also treated for presumptive septic shock on broad-spectrum antibiotics and required long-term intubation and ICU stay.  Based on his pericardial effusion and signs and symptoms of tamponade physiology, he was taken urgently to the operating room and underwent pericardial window.  This  demonstrated a large, hemorrhagic pericardial effusion with tamponade.  Subsequently he had dramatic improvements in his hemodynamics and ultimately was able to be weaned off of pressors and extubated.  He also struggled with atrial fibrillation.  This is a long-standing issue and he was seen in the past by Dr. Curt Bears in 2018.  [name] returns today in follow-up.  Remarkably seems to be doing well.  He underwent a long stent of rehabilitation but is able to walk now, albeit slowly with a walker.  He was seen in follow-up by Doreene Adas, PA-C, who did not recommend restarting anticoagulation due to his hemorrhagic pericardial effusion.  Although, he has had pericardial window therefore he is risk of recurrent tamponade is low.  He is in persistent atrial flutter today, which would be a greater risk factor for stroke.  Finally he is struggled with hypotension on dialysis days.  He has been taking Midrin 5 mg 3 times daily every day, not just on dialysis days.  Today is a nondialysis day and his blood pressure was elevated 179/88.  09/15/2018  [name] was last seen in follow-up by Dr. Curt Bears with cardiac EP on 07/25/2018.  He noted that his INR is on Coumadin and is followed by his PCP.  [name] had an INR checked yesterday which was 2.1.  He does have a history of hemorrhagic pericardial effusion and is status post pericardial window as of August 2019.  Blood pressure appears to be low at times and he uses midodrine on dialysis days.  Fortunately, he had no coronary artery disease by catheterization.  He currently denies any  chest pain or worsening shortness of breath.  Weight has been stable and he denies any worsening swelling or weight gain.  He is still interested in pursuing kidney transplant.  He did have a visit in July 2019 at [facility] and they are inquiring about his medical records.  I will copy this letter to the transplant coordinator.  The patient does not have symptoms concerning for COVID-19  infection (fever, chills, cough, or new SHORTNESS OF BREATH).    Prior CV studies:   The following studies were reviewed today:  Records Office visits Labs  PMHx:  Past Medical History:  Diagnosis Date  . Anemia   . Arthritis    HNP- lumbar, "all over my body"  . Blood transfusion    "years ago; blood was low" (08/05/2013)  . CKD (chronic kidney disease) stage 4, GFR 15-29 ml/min (HCC) 03/18/2012   [place]- T,TH,Sat.  . Diabetic nephropathy ([place])   . Diabetic retinopathy   . DVT (deep venous thrombosis) ([place])    "got one in my right leg now; I've had one before too, not sure which leg" (08/05/2013)  . ESRD (end stage renal disease) on dialysis [facility])    "just started today, (08/04/2013)"  . Family history of anesthesia complication    " my son wakes up slowly"  . GERD (gastroesophageal reflux disease)    uses alka seltzere on occas.   [name])    "one q now and then" (08/05/2013)  . Hyperlipidemia   . Hypertension   . 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 Non Hodgkin's lymphoma ([place])    Tx 2009; "had chemo; it went away" (08/05/2013)  . Noncompliance 03/16/2012  . NSVT (nonsustained ventricular tachycardia) ([place]) 03/18/2012  . Peripheral vascular disease ([place])   . Pneumonia 2013   hosp.-   . Poor historian    pt. unsure of several answers to health history questions   . Skin cancer    melanoma - head  . Sleep apnea    "suppose to have a sleep study, but they never told me when. (08/05/2013)    Past Surgical History:  Procedure Laterality Date  . ACHILLES TENDON SURGERY Right 03/13/2016   Procedure: ACHILLES LENGTHENING/KIDNER;  Surgeon: Edrick Kins, DPM;  Location: [place];  Service: Podiatry;  Laterality: Right;  . AV FISTULA PLACEMENT Left 02/03/2013   Procedure: ARTERIOVENOUS (AV) FISTULA CREATION- LEFT RADIAL CEPHALIC; ULTRASOUND GUIDED;  Surgeon: Mal Misty,  MD;  Location: [place] [facility];  Service: Vascular;  Laterality: Left;  . AV FISTULA PLACEMENT Right 11/08/2015   Procedure: RIGHT BRACHIOCEPHALIC ARTERIOVENOUS (AV) FISTULA CREATION;  Surgeon: Serafina Mitchell, MD;  Location: [place];  Service: Vascular;  Laterality: Right;  . BASCILIC VEIN TRANSPOSITION Right 01/24/2016   Procedure: RIGHT SECOND STAGE BASILIC VEIN TRANSPOSITION;  Surgeon: Angelia Mould, MD;  Location: [place];  Service: Vascular;  Laterality: Right;  . CARDIAC CATHETERIZATION    . COLONOSCOPY N/A 09/23/2015   Procedure: COLONOSCOPY;  Surgeon: Irene Shipper, MD;  [facility];  Service: Endoscopy;  Laterality: N/A;  . Coloscopy    . EYE SURGERY Bilateral   . GAS INSERTION  05/10/2012   Procedure: INSERTION OF GAS;  Surgeon: Hayden Pedro, MD;  Location: [place];  Service: Ophthalmology;  Laterality: Right;  . LEFT HEART CATH AND CORONARY ANGIOGRAPHY N/A 09/11/2016   Procedure: Left Heart Cath and  Coronary Angiography;  Surgeon: Belva Crome, MD;  Location: [place] [facility];  Service: Cardiovascular;  Laterality: N/A;  . LESION EXCISION Right 05/08/2015   Procedure: EXCISION SCALP LESION;  Surgeon: Erroll Luna, MD;  Location: [place];  Service: General;  Laterality: Right;  . MEMBRANE PEEL  05/10/2012   Procedure: MEMBRANE PEEL;  Surgeon: Hayden Pedro, MD;  Location: [place];  Service: Ophthalmology;  Laterality: Right;  . PARS PLANA VITRECTOMY  08/27/2011   Procedure: PARS PLANA VITRECTOMY WITH 25 GAUGE;  Surgeon: Hayden Pedro, MD;  Location: [place];  Service: Ophthalmology;  Laterality: Left;  Repair of complex traction retinal detachment left eye  . PARS PLANA VITRECTOMY  05/10/2012   Procedure: PARS PLANA VITRECTOMY WITH 25 GAUGE;  Surgeon: Hayden Pedro, MD;  Location: [place];  Service: Ophthalmology;  Laterality: Right;  Repair Complex Traction Retinal Detachment  . PHOTOCOAGULATION WITH LASER  05/10/2012   Procedure: PHOTOCOAGULATION WITH LASER;  Surgeon: Hayden Pedro, MD;  Location: [place];  Service: Ophthalmology;  Laterality: Right;  . PORT-A-CATH REMOVAL    . PORTACATH PLACEMENT    . SUBXYPHOID PERICARDIAL WINDOW N/A 01/10/2018   Procedure: SUBXYPHOID PERICARDIAL WINDOW;  Surgeon: Rexene Alberts, MD;  Location: [place];  Service: Thoracic;  Laterality: N/A;  . TRANSMETATARSAL AMPUTATION Right 03/13/2016   Procedure: TRANSMETATARSAL AMPUTATION;  Surgeon: Edrick Kins, DPM;  Location: [place];  Service: Podiatry;  Laterality: Right;  . VENA CAVA FILTER PLACEMENT  09/2012   due to preparation for surgery    FAMHx:  Family History  Problem Relation Age of Onset  . Anesthesia problems Son   . Hypertension Son   . Diabetes Father   . Hypertension Father   . Other Father        amputation  . Colon cancer Neg Hx     SOCHx:   reports that he has never smoked. He has never used smokeless tobacco. He reports that he does not drink alcohol or use drugs.  ALLERGIES:  No Known Allergies  MEDS:  Current Meds  Medication Sig  . acetaminophen (TYLENOL) 325 MG tablet Take 650 mg by mouth every 6 (six) hours as needed (pain).  . Cholecalciferol (VITAMIN D3) 50000 units CAPS Take 1 capsule by mouth daily.  . insulin aspart (NOVOLOG) 100 UNIT/ML injection Inject 1-9 Units into the skin 3 (three) times daily before meals.  . insulin glargine (LANTUS) 100 UNIT/ML injection Inject 0.15 mLs (15 Units total) into the skin at bedtime.  Marland Kitchen levofloxacin (LEVAQUIN) 500 MG tablet Take 1 tablet (500 mg total) by mouth every other day. Take after dialysis on dialysis days  . lidocaine-prilocaine (EMLA) cream Apply 1 application topically as needed (Apply small amount to access site 1-2 hours before dialysis. Cover with occlusive dressing (saran wrap)).   . midodrine (PROAMATINE) 5 MG tablet Take 5 mg by mouth 3 (three) times daily with meals. Take ONLY on DIALYSIS DAYS  . MULTIPLE VITAMINS-MINERALS PO Take by mouth. 1 tablet PO daily  . multivitamin (RENA-VIT)  TABS tablet Take 1 tablet by mouth at bedtime.  . polysaccharide iron (NIFEREX) 150 MG CAPS capsule Take 1 capsule (150 mg total) by mouth daily.  . sevelamer carbonate (RENVELA) 800 MG tablet Take 1,600 mg by mouth 3 (three) times daily with meals.  . Skin Protectants, Misc. (MINERIN) CREA Apply topically. Topically every 12 hours  . traMADol (ULTRAM) 50 MG tablet Take 1 tablet (50 mg total) by mouth  every 6 (six) hours as needed.  . vitamin C (ASCORBIC ACID) 500 MG tablet Take 500 mg by mouth 2 (two) times daily.  Marland Kitchen warfarin (COUMADIN) 5 MG tablet TAKE 1 TABLET BY MOUTH ON TUES WEDS & FRI. TAKE 1 & 1 2 TABLETS MON THURS SAT & SUN  . zinc sulfate 220 (50 Zn) MG capsule Take 220 mg by mouth daily.     ROS: Pertinent items noted in HPI and remainder of comprehensive ROS otherwise negative.  Labs/Other Tests and Data Reviewed:    Recent Labs: 01/09/2018: B Natriuretic Peptide 276.0 01/14/2018: Magnesium 2.7 01/17/2018: ALT 840 07/16/2018: BUN 17; Creatinine, Ser 4.22; Hemoglobin 10.7; Platelets 132; Potassium 3.8; Sodium 134   Recent Lipid Panel Lab Results  Component Value Date/Time   CHOL 129 01/14/2011 04:37 AM   TRIG 137 01/14/2011 04:37 AM   HDL 13 (L) 01/14/2011 04:37 AM   CHOLHDL 9.9 01/14/2011 04:37 AM   LDLCALC 89 01/14/2011 04:37 AM    Wt Readings from Last 3 Encounters:  09/15/18 245 lb (111.1 kg)  07/25/18 250 lb (113.4 kg)  03/09/18 242 lb 9.6 oz (110 kg)     Exam:    Vital Signs:  BP 104/66   Pulse 98   Ht 6' 3.5" (1.918 m)   Wt 245 lb (111.1 kg)   BMI 30.22 kg/m    No exam due to telephone visit  ASSESSMENT & PLAN:    1. PAF/flutter 2. Recent tamponade with hemorrhagic effusion-status post pericardial window (12/2017) 3. No significant obstructive coronary disease by cath (12/2017) 4. End-stage renal disease on hemodialysis -attempting to obtain kidney transplant 5. Insulin-dependent diabetes with endorgan effects 6. Hypertension 7. Dyslipidemia  Mr.  Beougher seems to be doing fairly well with regards to A. fib and flutter.  He had a pericardial window for hemorrhagic pericardial effusion and nearly died of tamponade physiology.  Fortunately he is done well on dialysis.  His blood pressures been low normal requiring midodrine on dialysis days.  LVEF had improved up to 30 to 35% by his last echo in August 2019.  He will be due for repeat echo in about 6 months.  For now his weight is been stable.  Will continue the medications that he can tolerate which are minimal and do not include all of the guideline directed heart failure medications due to hypotension.  Fortunately had no significant obstructive coronary disease by cath.  Plan follow-up with me in 6 months after an echo.  COVID-19 Education: The signs and symptoms of COVID-19 were discussed with the patient and how to seek care for testing (follow up with PCP or arrange E-visit).  The importance of social distancing was discussed today.  Patient Risk:   After full review of this patients clinical status, I feel that they are at least moderate risk at this time.  Time:   Today, I have spent 25 minutes with the patient with telehealth technology discussing A. fib flutter, pericardial effusion with tamponade, anticoagulation, diabetes, hypertension, end-stage renal disease, and transplant work-up.     Medication Adjustments/Labs and Tests Ordered: Current medicines are reviewed at length with the patient today.  Concerns regarding medicines are outlined above.   Tests Ordered: Orders Placed This Encounter  Procedures  . ECHOCARDIOGRAM COMPLETE    Medication Changes: No orders of the defined types were placed in this encounter.   Disposition:  in 6 month(s)  Pixie Casino, MD, [facility], [place]  Director of the [place] of the American Board of Clinical Lipidology Attending  Cardiologist  Direct Dial: [phone]  Fax: [phone]  Website:  [email or web address]  Pixie Casino, MD  09/15/2018 4:00 PM

## 2018-09-16 ENCOUNTER — Ambulatory Visit: Payer: Medicare Other | Admitting: Internal Medicine

## 2018-09-17 DIAGNOSIS — E1129 Type 2 diabetes mellitus with other diabetic kidney complication: Secondary | ICD-10-CM | POA: Diagnosis not present

## 2018-09-17 DIAGNOSIS — E876 Hypokalemia: Secondary | ICD-10-CM | POA: Diagnosis not present

## 2018-09-17 DIAGNOSIS — N186 End stage renal disease: Secondary | ICD-10-CM | POA: Diagnosis not present

## 2018-09-17 DIAGNOSIS — N2581 Secondary hyperparathyroidism of renal origin: Secondary | ICD-10-CM | POA: Diagnosis not present

## 2018-09-17 DIAGNOSIS — D631 Anemia in chronic kidney disease: Secondary | ICD-10-CM | POA: Diagnosis not present

## 2018-09-19 DIAGNOSIS — I10 Essential (primary) hypertension: Secondary | ICD-10-CM | POA: Diagnosis not present

## 2018-09-19 DIAGNOSIS — E1129 Type 2 diabetes mellitus with other diabetic kidney complication: Secondary | ICD-10-CM | POA: Diagnosis not present

## 2018-09-19 DIAGNOSIS — Z125 Encounter for screening for malignant neoplasm of prostate: Secondary | ICD-10-CM | POA: Diagnosis not present

## 2018-09-19 DIAGNOSIS — E7849 Other hyperlipidemia: Secondary | ICD-10-CM | POA: Diagnosis not present

## 2018-09-20 DIAGNOSIS — E876 Hypokalemia: Secondary | ICD-10-CM | POA: Diagnosis not present

## 2018-09-20 DIAGNOSIS — N186 End stage renal disease: Secondary | ICD-10-CM | POA: Diagnosis not present

## 2018-09-20 DIAGNOSIS — D631 Anemia in chronic kidney disease: Secondary | ICD-10-CM | POA: Diagnosis not present

## 2018-09-20 DIAGNOSIS — E1129 Type 2 diabetes mellitus with other diabetic kidney complication: Secondary | ICD-10-CM | POA: Diagnosis not present

## 2018-09-20 DIAGNOSIS — N2581 Secondary hyperparathyroidism of renal origin: Secondary | ICD-10-CM | POA: Diagnosis not present

## 2018-09-21 ENCOUNTER — Other Ambulatory Visit: Payer: Self-pay

## 2018-09-21 ENCOUNTER — Encounter: Payer: Self-pay | Admitting: Internal Medicine

## 2018-09-21 ENCOUNTER — Ambulatory Visit (INDEPENDENT_AMBULATORY_CARE_PROVIDER_SITE_OTHER): Payer: Medicare Other | Admitting: Internal Medicine

## 2018-09-21 VITALS — Ht 75.0 in | Wt 245.0 lb

## 2018-09-21 DIAGNOSIS — IMO0001 Reserved for inherently not codable concepts without codable children: Secondary | ICD-10-CM

## 2018-09-21 DIAGNOSIS — Z794 Long term (current) use of insulin: Secondary | ICD-10-CM

## 2018-09-21 DIAGNOSIS — Z86010 Personal history of colonic polyps: Secondary | ICD-10-CM

## 2018-09-21 DIAGNOSIS — E118 Type 2 diabetes mellitus with unspecified complications: Secondary | ICD-10-CM

## 2018-09-21 DIAGNOSIS — Z992 Dependence on renal dialysis: Secondary | ICD-10-CM

## 2018-09-21 DIAGNOSIS — R197 Diarrhea, unspecified: Secondary | ICD-10-CM

## 2018-09-21 DIAGNOSIS — D689 Coagulation defect, unspecified: Secondary | ICD-10-CM

## 2018-09-21 DIAGNOSIS — N186 End stage renal disease: Secondary | ICD-10-CM

## 2018-09-21 DIAGNOSIS — Z7901 Long term (current) use of anticoagulants: Secondary | ICD-10-CM | POA: Diagnosis not present

## 2018-09-21 NOTE — Progress Notes (Signed)
HISTORY OF PRESENT ILLNESS:  [name] is a 68 y.o. male with MULTIPLE SIGNIFICANT medical problems who presents today via telemedicine conference during the coronavirus pandemic regarding surveillance colonoscopy. The patient has a history of end-stage renal disease for which he is on hemodialysis Tuesday, Thursday, Saturday. He also has a history of right lower extremity DVT for which he is on chronic Coumadin therapy. He has long-standing insulin requiring diabetes mellitus. He has a history of non-Hodgkin's lymphoma for which he was treated. He has a history of adenomatous colon polyps with multiple adenomas in 2006. Follow-up colonoscopy in June 2010 revealed 2 adenomatous which were removed. His preparation was fair/adequate.  He was last seen in the office March 2017 regarding surveillance colonoscopy.  Found to have a breast mass which was tender and subsequently worked up as gynecomastia.  His last colonoscopy was April 2017.  At that time he was found to have 5 subcentimeter polyps.  His preparation was excellent with Suprep.  Follow-up in about 3 years recommended.  Patient did receive a recall letter.  His chief complaints are the need for surveillance colonoscopy and post hemodialysis diarrhea.  The patient states he has had loose bowels after completing dialysis for many years.  This is unchanged.  No associated bleeding.  No weight loss.  No abdominal cramping.  No incontinence.  He is on multiple medications as listed.  He states that his diabetes has been under good control.  He continues to see Dr. Reynaldo Minium for his general medical needs and primary care.  REVIEW OF SYSTEMS:  All non-GI ROS negative as otherwise stated in the HPI except for arthritis, fatigue  Past Medical History:  Diagnosis Date  . Anemia   . Arthritis    HNP- lumbar, "all over my body"  . Blood transfusion    "years ago; blood was low" (08/05/2013)  . CKD (chronic kidney disease) stage 4, GFR 15-29 ml/min (HCC)  03/18/2012   [place]- T,TH,Sat.  . Diabetic nephropathy ([place])   . Diabetic retinopathy   . DVT (deep venous thrombosis) ([place])    "got one in my right leg now; I've had one before too, not sure which leg" (08/05/2013)  . ESRD (end stage renal disease) on dialysis [facility])    "just started today, (08/04/2013)"  . Family history of anesthesia complication    " my son wakes up slowly"  . GERD (gastroesophageal reflux disease)    uses alka seltzere on occas.   [name])    "one q now and then" (08/05/2013)  . Hyperlipidemia   . Hypertension   . 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 Non Hodgkin's lymphoma ([place])    Tx 2009; "had chemo; it went away" (08/05/2013)  . Noncompliance 03/16/2012  . NSVT (nonsustained ventricular tachycardia) ([place]) 03/18/2012  . Peripheral vascular disease ([place])   . Pneumonia 2013   hosp.-   . Poor historian    pt. unsure of several answers to health history questions   . Skin cancer    melanoma - head  . Sleep apnea    "suppose to have a sleep study, but they never told me when. (08/05/2013)    Past Surgical History:  Procedure Laterality Date  . ACHILLES TENDON SURGERY Right 03/13/2016   Procedure: ACHILLES LENGTHENING/KIDNER;  Surgeon: Edrick Kins, DPM;  Location: [place];  Service: Podiatry;  Laterality: Right;  .  AV FISTULA PLACEMENT Left 02/03/2013   Procedure: ARTERIOVENOUS (AV) FISTULA CREATION- LEFT RADIAL CEPHALIC; ULTRASOUND GUIDED;  Surgeon: Mal Misty, MD;  Location: [facility];  Service: Vascular;  Laterality: Left;  . AV FISTULA PLACEMENT Right 11/08/2015   Procedure: RIGHT BRACHIOCEPHALIC ARTERIOVENOUS (AV) FISTULA CREATION;  Surgeon: Serafina Mitchell, MD;  Location: [place];  Service: Vascular;  Laterality: Right;  . BASCILIC VEIN TRANSPOSITION Right 01/24/2016   Procedure: RIGHT SECOND STAGE BASILIC VEIN TRANSPOSITION;  Surgeon: Angelia Mould,  MD;  Location: [place];  Service: Vascular;  Laterality: Right;  . CARDIAC CATHETERIZATION    . COLONOSCOPY N/A 09/23/2015   Procedure: COLONOSCOPY;  Surgeon: Irene Shipper, MD;  [facility];  Service: Endoscopy;  Laterality: N/A;  . Coloscopy    . EYE SURGERY Bilateral   . GAS INSERTION  05/10/2012   Procedure: INSERTION OF GAS;  Surgeon: Hayden Pedro, MD;  Location: [place];  Service: Ophthalmology;  Laterality: Right;  . LEFT HEART CATH AND CORONARY ANGIOGRAPHY N/A 09/11/2016   Procedure: Left Heart Cath and Coronary Angiography;  Surgeon: Belva Crome, MD;  Location: [place] [facility];  Service: Cardiovascular;  Laterality: N/A;  . LESION EXCISION Right 05/08/2015   Procedure: EXCISION SCALP LESION;  Surgeon: Erroll Luna, MD;  Location: [place];  Service: General;  Laterality: Right;  . MEMBRANE PEEL  05/10/2012   Procedure: MEMBRANE PEEL;  Surgeon: Hayden Pedro, MD;  Location: [place];  Service: Ophthalmology;  Laterality: Right;  . PARS PLANA VITRECTOMY  08/27/2011   Procedure: PARS PLANA VITRECTOMY WITH 25 GAUGE;  Surgeon: Hayden Pedro, MD;  Location: [place];  Service: Ophthalmology;  Laterality: Left;  Repair of complex traction retinal detachment left eye  . PARS PLANA VITRECTOMY  05/10/2012   Procedure: PARS PLANA VITRECTOMY WITH 25 GAUGE;  Surgeon: Hayden Pedro, MD;  Location: [place];  Service: Ophthalmology;  Laterality: Right;  Repair Complex Traction Retinal Detachment  . PHOTOCOAGULATION WITH LASER  05/10/2012   Procedure: PHOTOCOAGULATION WITH LASER;  Surgeon: Hayden Pedro, MD;  Location: [place];  Service: Ophthalmology;  Laterality: Right;  . PORT-A-CATH REMOVAL    . PORTACATH PLACEMENT    . SUBXYPHOID PERICARDIAL WINDOW N/A 01/10/2018   Procedure: SUBXYPHOID PERICARDIAL WINDOW;  Surgeon: Rexene Alberts, MD;  Location: [place];  Service: Thoracic;  Laterality: N/A;  . TRANSMETATARSAL AMPUTATION Right 03/13/2016   Procedure: TRANSMETATARSAL AMPUTATION;  Surgeon:  Edrick Kins, DPM;  Location: [place];  Service: Podiatry;  Laterality: Right;  . VENA CAVA FILTER PLACEMENT  09/2012   due to preparation for surgery    Social History [name]  reports that he has never smoked. He has never used smokeless tobacco. He reports that he does not drink alcohol or use drugs.  family history includes Anesthesia problems in his son; Diabetes in his father; Hypertension in his father and son; Other in his father.  No Known Allergies     PHYSICAL EXAMINATION: Vital signs: Ht 6\' 3"  (1.905 m)   Wt 245 lb (111.1 kg)   BMI 30.62 kg/m   No additional examination as this is a telemedicine visit  ASSESSMENT:  1.  Diarrhea postdialysis.  Nonprogressive.  No associated worrisome features.  Might be evaluated at the time of his upcoming colonoscopy to rule out microscopic colitis. 2.  History of multiple adenomatous colon polyps.  Due for routine surveillance 3.  Multiple significant medical problems which based the patient at  high risk.  These include insulin requiring diabetes mellitus, end-stage renal disease with ongoing hemodialysis, and history of DVT with the need for chronic anticoagulation therapy. 4.  Coronavirus pandemic.  Immunocompromise patient  PLAN:  1.  Recommended bulking agent for diarrhea 2.  Schedule routine in person office follow-up in July.  At that time I would like to reassess him and if appropriate set him up for his colonoscopy.  Last examination was at the hospital.  As stated above the patient would be at high risk. 3.  PLEASE MAKE OFFICE VISIT RECALL FOR THIS PATIENT IN 3 MONTHS  This telemedicine visit (Medicare patient) was initiated by the patient who consented for the visit as well.  During this encounter the patient was in his home while I was in my office.  He understands her may be an associated professional charge for the service

## 2018-09-22 DIAGNOSIS — E876 Hypokalemia: Secondary | ICD-10-CM | POA: Diagnosis not present

## 2018-09-22 DIAGNOSIS — D631 Anemia in chronic kidney disease: Secondary | ICD-10-CM | POA: Diagnosis not present

## 2018-09-22 DIAGNOSIS — N2581 Secondary hyperparathyroidism of renal origin: Secondary | ICD-10-CM | POA: Diagnosis not present

## 2018-09-22 DIAGNOSIS — N186 End stage renal disease: Secondary | ICD-10-CM | POA: Diagnosis not present

## 2018-09-22 DIAGNOSIS — E1129 Type 2 diabetes mellitus with other diabetic kidney complication: Secondary | ICD-10-CM | POA: Diagnosis not present

## 2018-09-24 DIAGNOSIS — N186 End stage renal disease: Secondary | ICD-10-CM | POA: Diagnosis not present

## 2018-09-24 DIAGNOSIS — E1129 Type 2 diabetes mellitus with other diabetic kidney complication: Secondary | ICD-10-CM | POA: Diagnosis not present

## 2018-09-24 DIAGNOSIS — D631 Anemia in chronic kidney disease: Secondary | ICD-10-CM | POA: Diagnosis not present

## 2018-09-24 DIAGNOSIS — E876 Hypokalemia: Secondary | ICD-10-CM | POA: Diagnosis not present

## 2018-09-24 DIAGNOSIS — N2581 Secondary hyperparathyroidism of renal origin: Secondary | ICD-10-CM | POA: Diagnosis not present

## 2018-09-26 DIAGNOSIS — I313 Pericardial effusion (noninflammatory): Secondary | ICD-10-CM | POA: Diagnosis not present

## 2018-09-26 DIAGNOSIS — E1129 Type 2 diabetes mellitus with other diabetic kidney complication: Secondary | ICD-10-CM | POA: Diagnosis not present

## 2018-09-26 DIAGNOSIS — I110 Hypertensive heart disease with heart failure: Secondary | ICD-10-CM | POA: Diagnosis not present

## 2018-09-26 DIAGNOSIS — Z992 Dependence on renal dialysis: Secondary | ICD-10-CM | POA: Diagnosis not present

## 2018-09-26 DIAGNOSIS — M65331 Trigger finger, right middle finger: Secondary | ICD-10-CM | POA: Diagnosis not present

## 2018-09-26 DIAGNOSIS — Z0000 Encounter for general adult medical examination without abnormal findings: Secondary | ICD-10-CM | POA: Diagnosis not present

## 2018-09-26 DIAGNOSIS — M65332 Trigger finger, left middle finger: Secondary | ICD-10-CM | POA: Diagnosis not present

## 2018-09-26 DIAGNOSIS — E1139 Type 2 diabetes mellitus with other diabetic ophthalmic complication: Secondary | ICD-10-CM | POA: Diagnosis not present

## 2018-09-26 DIAGNOSIS — E785 Hyperlipidemia, unspecified: Secondary | ICD-10-CM | POA: Diagnosis not present

## 2018-09-26 DIAGNOSIS — E1140 Type 2 diabetes mellitus with diabetic neuropathy, unspecified: Secondary | ICD-10-CM | POA: Diagnosis not present

## 2018-09-26 DIAGNOSIS — H3520 Other non-diabetic proliferative retinopathy, unspecified eye: Secondary | ICD-10-CM | POA: Diagnosis not present

## 2018-09-26 DIAGNOSIS — I5022 Chronic systolic (congestive) heart failure: Secondary | ICD-10-CM | POA: Diagnosis not present

## 2018-09-27 DIAGNOSIS — N2581 Secondary hyperparathyroidism of renal origin: Secondary | ICD-10-CM | POA: Diagnosis not present

## 2018-09-27 DIAGNOSIS — E876 Hypokalemia: Secondary | ICD-10-CM | POA: Diagnosis not present

## 2018-09-27 DIAGNOSIS — E1129 Type 2 diabetes mellitus with other diabetic kidney complication: Secondary | ICD-10-CM | POA: Diagnosis not present

## 2018-09-27 DIAGNOSIS — N186 End stage renal disease: Secondary | ICD-10-CM | POA: Diagnosis not present

## 2018-09-27 DIAGNOSIS — D631 Anemia in chronic kidney disease: Secondary | ICD-10-CM | POA: Diagnosis not present

## 2018-09-29 DIAGNOSIS — D631 Anemia in chronic kidney disease: Secondary | ICD-10-CM | POA: Diagnosis not present

## 2018-09-29 DIAGNOSIS — E876 Hypokalemia: Secondary | ICD-10-CM | POA: Diagnosis not present

## 2018-09-29 DIAGNOSIS — E1129 Type 2 diabetes mellitus with other diabetic kidney complication: Secondary | ICD-10-CM | POA: Diagnosis not present

## 2018-09-29 DIAGNOSIS — N2581 Secondary hyperparathyroidism of renal origin: Secondary | ICD-10-CM | POA: Diagnosis not present

## 2018-09-29 DIAGNOSIS — N186 End stage renal disease: Secondary | ICD-10-CM | POA: Diagnosis not present

## 2018-09-30 DIAGNOSIS — E1129 Type 2 diabetes mellitus with other diabetic kidney complication: Secondary | ICD-10-CM | POA: Diagnosis not present

## 2018-09-30 DIAGNOSIS — N186 End stage renal disease: Secondary | ICD-10-CM | POA: Diagnosis not present

## 2018-09-30 DIAGNOSIS — Z992 Dependence on renal dialysis: Secondary | ICD-10-CM | POA: Diagnosis not present

## 2018-10-01 DIAGNOSIS — N2581 Secondary hyperparathyroidism of renal origin: Secondary | ICD-10-CM | POA: Diagnosis not present

## 2018-10-01 DIAGNOSIS — N186 End stage renal disease: Secondary | ICD-10-CM | POA: Diagnosis not present

## 2018-10-01 DIAGNOSIS — E876 Hypokalemia: Secondary | ICD-10-CM | POA: Diagnosis not present

## 2018-10-01 DIAGNOSIS — E1129 Type 2 diabetes mellitus with other diabetic kidney complication: Secondary | ICD-10-CM | POA: Diagnosis not present

## 2018-10-03 ENCOUNTER — Other Ambulatory Visit: Payer: Self-pay

## 2018-10-03 NOTE — Patient Outreach (Signed)
Screening: Medicare Tier 5 screening:   Placed call to patient who answered. Explained reason for call. Patient reports that he is doing well. Reports he has had followed up with his primary MD and cardiologist via phone.  Reports he rides the [place] to dialysis 3 times a week. ( Tuesday, Thursday and Saturday schedule)   Reports he is currently working on paperwork to try to get a kidney transplant.  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 Reports that he has a CNA  M-F for 2 hours from 6p-8p.  Reports she helps him go to the store and any other needs. Reports that he takes his medications as prescribed. Reports Dm in good shape with CBG range o 120-140.  Takes his insulin as prescribed.   Reports his dry weight was recently increased because of cramps and reports that he feels better when they do not take off too much fluid.  Reports labs are drawn at dialysis on Thursdays.   PLAN: again reviewed [facility] program and goals of keeping him healthy and happy at home. Patient denies any needs at this time.  Offered to [employer] letter and patient agreed. Confirmed address.   Tomasa Rand, RN, BSN, CEN [facility] [employer] [phone]

## 2018-10-04 DIAGNOSIS — N186 End stage renal disease: Secondary | ICD-10-CM | POA: Diagnosis not present

## 2018-10-04 DIAGNOSIS — N2581 Secondary hyperparathyroidism of renal origin: Secondary | ICD-10-CM | POA: Diagnosis not present

## 2018-10-04 DIAGNOSIS — E1129 Type 2 diabetes mellitus with other diabetic kidney complication: Secondary | ICD-10-CM | POA: Diagnosis not present

## 2018-10-04 DIAGNOSIS — E876 Hypokalemia: Secondary | ICD-10-CM | POA: Diagnosis not present

## 2018-10-06 DIAGNOSIS — E876 Hypokalemia: Secondary | ICD-10-CM | POA: Diagnosis not present

## 2018-10-06 DIAGNOSIS — N186 End stage renal disease: Secondary | ICD-10-CM | POA: Diagnosis not present

## 2018-10-06 DIAGNOSIS — N2581 Secondary hyperparathyroidism of renal origin: Secondary | ICD-10-CM | POA: Diagnosis not present

## 2018-10-06 DIAGNOSIS — E1129 Type 2 diabetes mellitus with other diabetic kidney complication: Secondary | ICD-10-CM | POA: Diagnosis not present

## 2018-10-08 DIAGNOSIS — E1129 Type 2 diabetes mellitus with other diabetic kidney complication: Secondary | ICD-10-CM | POA: Diagnosis not present

## 2018-10-08 DIAGNOSIS — N2581 Secondary hyperparathyroidism of renal origin: Secondary | ICD-10-CM | POA: Diagnosis not present

## 2018-10-08 DIAGNOSIS — E876 Hypokalemia: Secondary | ICD-10-CM | POA: Diagnosis not present

## 2018-10-08 DIAGNOSIS — N186 End stage renal disease: Secondary | ICD-10-CM | POA: Diagnosis not present

## 2018-10-11 DIAGNOSIS — E876 Hypokalemia: Secondary | ICD-10-CM | POA: Diagnosis not present

## 2018-10-11 DIAGNOSIS — N2581 Secondary hyperparathyroidism of renal origin: Secondary | ICD-10-CM | POA: Diagnosis not present

## 2018-10-11 DIAGNOSIS — E1129 Type 2 diabetes mellitus with other diabetic kidney complication: Secondary | ICD-10-CM | POA: Diagnosis not present

## 2018-10-11 DIAGNOSIS — N186 End stage renal disease: Secondary | ICD-10-CM | POA: Diagnosis not present

## 2018-10-12 DIAGNOSIS — Z7901 Long term (current) use of anticoagulants: Secondary | ICD-10-CM | POA: Diagnosis not present

## 2018-10-12 DIAGNOSIS — I4891 Unspecified atrial fibrillation: Secondary | ICD-10-CM | POA: Diagnosis not present

## 2018-10-13 DIAGNOSIS — E876 Hypokalemia: Secondary | ICD-10-CM | POA: Diagnosis not present

## 2018-10-13 DIAGNOSIS — N186 End stage renal disease: Secondary | ICD-10-CM | POA: Diagnosis not present

## 2018-10-13 DIAGNOSIS — E1129 Type 2 diabetes mellitus with other diabetic kidney complication: Secondary | ICD-10-CM | POA: Diagnosis not present

## 2018-10-13 DIAGNOSIS — N2581 Secondary hyperparathyroidism of renal origin: Secondary | ICD-10-CM | POA: Diagnosis not present

## 2018-10-15 DIAGNOSIS — E1129 Type 2 diabetes mellitus with other diabetic kidney complication: Secondary | ICD-10-CM | POA: Diagnosis not present

## 2018-10-15 DIAGNOSIS — N2581 Secondary hyperparathyroidism of renal origin: Secondary | ICD-10-CM | POA: Diagnosis not present

## 2018-10-15 DIAGNOSIS — N186 End stage renal disease: Secondary | ICD-10-CM | POA: Diagnosis not present

## 2018-10-15 DIAGNOSIS — E876 Hypokalemia: Secondary | ICD-10-CM | POA: Diagnosis not present

## 2018-10-18 DIAGNOSIS — E876 Hypokalemia: Secondary | ICD-10-CM | POA: Diagnosis not present

## 2018-10-18 DIAGNOSIS — N186 End stage renal disease: Secondary | ICD-10-CM | POA: Diagnosis not present

## 2018-10-18 DIAGNOSIS — N2581 Secondary hyperparathyroidism of renal origin: Secondary | ICD-10-CM | POA: Diagnosis not present

## 2018-10-18 DIAGNOSIS — E1129 Type 2 diabetes mellitus with other diabetic kidney complication: Secondary | ICD-10-CM | POA: Diagnosis not present

## 2018-10-19 ENCOUNTER — Telehealth: Payer: Self-pay | Admitting: *Deleted

## 2018-10-19 NOTE — Telephone Encounter (Signed)
"  [name] [phone]).  I received chemotherapy and radiation in the past.  [name] with [facility] in [place] advised me to get a letter from Dr. Alen Blew and Dr. Tammi Klippel stating I was a patient and cancer free for five years.  Will have to call them for her last name and if letter can be faxed.  Call me when letters are ready.  I'm sure I need these as soon as can be provided."  Confirmed patient address and phone.  Aware Nurse messaging providers with this request.  Denies further questions or needs at this time.

## 2018-10-20 ENCOUNTER — Telehealth: Payer: Self-pay

## 2018-10-20 DIAGNOSIS — N2581 Secondary hyperparathyroidism of renal origin: Secondary | ICD-10-CM | POA: Diagnosis not present

## 2018-10-20 DIAGNOSIS — E876 Hypokalemia: Secondary | ICD-10-CM | POA: Diagnosis not present

## 2018-10-20 DIAGNOSIS — E1129 Type 2 diabetes mellitus with other diabetic kidney complication: Secondary | ICD-10-CM | POA: Diagnosis not present

## 2018-10-20 DIAGNOSIS — N186 End stage renal disease: Secondary | ICD-10-CM | POA: Diagnosis not present

## 2018-10-20 NOTE — Telephone Encounter (Signed)
Typed letter signed by Dr. Alen Blew mailed to patient's house per patient request. Confirmed patient's address.

## 2018-10-20 NOTE — Telephone Encounter (Signed)
Ok to write a letter to say he is cancer free after being treated for lymphoma 10 years ago.

## 2018-10-21 ENCOUNTER — Encounter: Payer: Self-pay | Admitting: Radiation Oncology

## 2018-10-21 NOTE — Progress Notes (Signed)
Completed letter requested by patient. [name] to mail letter to patient's home.

## 2018-10-22 DIAGNOSIS — N2581 Secondary hyperparathyroidism of renal origin: Secondary | ICD-10-CM | POA: Diagnosis not present

## 2018-10-22 DIAGNOSIS — E1129 Type 2 diabetes mellitus with other diabetic kidney complication: Secondary | ICD-10-CM | POA: Diagnosis not present

## 2018-10-22 DIAGNOSIS — E876 Hypokalemia: Secondary | ICD-10-CM | POA: Diagnosis not present

## 2018-10-22 DIAGNOSIS — N186 End stage renal disease: Secondary | ICD-10-CM | POA: Diagnosis not present

## 2018-10-25 DIAGNOSIS — E876 Hypokalemia: Secondary | ICD-10-CM | POA: Diagnosis not present

## 2018-10-25 DIAGNOSIS — N2581 Secondary hyperparathyroidism of renal origin: Secondary | ICD-10-CM | POA: Diagnosis not present

## 2018-10-25 DIAGNOSIS — E1129 Type 2 diabetes mellitus with other diabetic kidney complication: Secondary | ICD-10-CM | POA: Diagnosis not present

## 2018-10-25 DIAGNOSIS — N186 End stage renal disease: Secondary | ICD-10-CM | POA: Diagnosis not present

## 2018-10-27 DIAGNOSIS — E1129 Type 2 diabetes mellitus with other diabetic kidney complication: Secondary | ICD-10-CM | POA: Diagnosis not present

## 2018-10-27 DIAGNOSIS — N2581 Secondary hyperparathyroidism of renal origin: Secondary | ICD-10-CM | POA: Diagnosis not present

## 2018-10-27 DIAGNOSIS — E876 Hypokalemia: Secondary | ICD-10-CM | POA: Diagnosis not present

## 2018-10-27 DIAGNOSIS — N186 End stage renal disease: Secondary | ICD-10-CM | POA: Diagnosis not present

## 2018-10-29 DIAGNOSIS — N186 End stage renal disease: Secondary | ICD-10-CM | POA: Diagnosis not present

## 2018-10-29 DIAGNOSIS — N2581 Secondary hyperparathyroidism of renal origin: Secondary | ICD-10-CM | POA: Diagnosis not present

## 2018-10-29 DIAGNOSIS — E876 Hypokalemia: Secondary | ICD-10-CM | POA: Diagnosis not present

## 2018-10-29 DIAGNOSIS — E1129 Type 2 diabetes mellitus with other diabetic kidney complication: Secondary | ICD-10-CM | POA: Diagnosis not present

## 2018-10-31 ENCOUNTER — Telehealth: Payer: Self-pay | Admitting: *Deleted

## 2018-10-31 DIAGNOSIS — N186 End stage renal disease: Secondary | ICD-10-CM | POA: Diagnosis not present

## 2018-10-31 DIAGNOSIS — Z992 Dependence on renal dialysis: Secondary | ICD-10-CM | POA: Diagnosis not present

## 2018-10-31 DIAGNOSIS — E1129 Type 2 diabetes mellitus with other diabetic kidney complication: Secondary | ICD-10-CM | POA: Diagnosis not present

## 2018-10-31 NOTE — Telephone Encounter (Signed)
On 10-31-18 fax medical records to nicole at [facility]

## 2018-11-01 DIAGNOSIS — N186 End stage renal disease: Secondary | ICD-10-CM | POA: Diagnosis not present

## 2018-11-01 DIAGNOSIS — E876 Hypokalemia: Secondary | ICD-10-CM | POA: Diagnosis not present

## 2018-11-01 DIAGNOSIS — N2581 Secondary hyperparathyroidism of renal origin: Secondary | ICD-10-CM | POA: Diagnosis not present

## 2018-11-01 DIAGNOSIS — E1129 Type 2 diabetes mellitus with other diabetic kidney complication: Secondary | ICD-10-CM | POA: Diagnosis not present

## 2018-11-02 DIAGNOSIS — Z7901 Long term (current) use of anticoagulants: Secondary | ICD-10-CM | POA: Diagnosis not present

## 2018-11-02 DIAGNOSIS — I4891 Unspecified atrial fibrillation: Secondary | ICD-10-CM | POA: Diagnosis not present

## 2018-11-03 DIAGNOSIS — E1129 Type 2 diabetes mellitus with other diabetic kidney complication: Secondary | ICD-10-CM | POA: Diagnosis not present

## 2018-11-03 DIAGNOSIS — E876 Hypokalemia: Secondary | ICD-10-CM | POA: Diagnosis not present

## 2018-11-03 DIAGNOSIS — N186 End stage renal disease: Secondary | ICD-10-CM | POA: Diagnosis not present

## 2018-11-03 DIAGNOSIS — N2581 Secondary hyperparathyroidism of renal origin: Secondary | ICD-10-CM | POA: Diagnosis not present

## 2018-11-04 ENCOUNTER — Telehealth: Payer: Self-pay

## 2018-11-04 NOTE — Telephone Encounter (Signed)
Received call from patient stating that he was told by someone named [name] at [facility] to contact this office and schedule a follow up visit. He provided the contact [phone] for Kirt Boys. This RN left a message per patient request to clarify the follow up.

## 2018-11-04 NOTE — Telephone Encounter (Signed)
-----   Message from Wyatt Portela, MD sent at 11/04/2018  4:00 PM EDT ----- Regarding: RE: clearance We gave them a letter on 5/22 indicating he is cancer free. Not sure if they received it. Maybe send it again please.  Thanks.  ----- Message ----- From: Scot Dock, RN Sent: 11/04/2018   3:38 PM EDT To: Wyatt Portela, MD Subject: clearance                                      Received call from [facility] confirming that the patient is cancer free to meet eligibility for a kidney transplant. They are questioning the last visit notes, 10/18/15, that mentions the possibility of recurrent disease/concern of metastasis. Can you please clarify. Thanks

## 2018-11-04 NOTE — Telephone Encounter (Signed)
Contacted [name] and updated that per Dr. Alen Blew the patient is cancer free. [name] stated that she does not need any further documentation at this time and will call back if anything else is needed.

## 2018-11-05 DIAGNOSIS — N186 End stage renal disease: Secondary | ICD-10-CM | POA: Diagnosis not present

## 2018-11-05 DIAGNOSIS — E1129 Type 2 diabetes mellitus with other diabetic kidney complication: Secondary | ICD-10-CM | POA: Diagnosis not present

## 2018-11-05 DIAGNOSIS — N2581 Secondary hyperparathyroidism of renal origin: Secondary | ICD-10-CM | POA: Diagnosis not present

## 2018-11-05 DIAGNOSIS — E876 Hypokalemia: Secondary | ICD-10-CM | POA: Diagnosis not present

## 2018-11-08 DIAGNOSIS — N186 End stage renal disease: Secondary | ICD-10-CM | POA: Diagnosis not present

## 2018-11-08 DIAGNOSIS — N2581 Secondary hyperparathyroidism of renal origin: Secondary | ICD-10-CM | POA: Diagnosis not present

## 2018-11-08 DIAGNOSIS — E876 Hypokalemia: Secondary | ICD-10-CM | POA: Diagnosis not present

## 2018-11-08 DIAGNOSIS — E1129 Type 2 diabetes mellitus with other diabetic kidney complication: Secondary | ICD-10-CM | POA: Diagnosis not present

## 2018-11-10 DIAGNOSIS — E1129 Type 2 diabetes mellitus with other diabetic kidney complication: Secondary | ICD-10-CM | POA: Diagnosis not present

## 2018-11-10 DIAGNOSIS — E876 Hypokalemia: Secondary | ICD-10-CM | POA: Diagnosis not present

## 2018-11-10 DIAGNOSIS — N186 End stage renal disease: Secondary | ICD-10-CM | POA: Diagnosis not present

## 2018-11-10 DIAGNOSIS — N2581 Secondary hyperparathyroidism of renal origin: Secondary | ICD-10-CM | POA: Diagnosis not present

## 2018-11-12 DIAGNOSIS — E1129 Type 2 diabetes mellitus with other diabetic kidney complication: Secondary | ICD-10-CM | POA: Diagnosis not present

## 2018-11-12 DIAGNOSIS — E876 Hypokalemia: Secondary | ICD-10-CM | POA: Diagnosis not present

## 2018-11-12 DIAGNOSIS — N186 End stage renal disease: Secondary | ICD-10-CM | POA: Diagnosis not present

## 2018-11-12 DIAGNOSIS — N2581 Secondary hyperparathyroidism of renal origin: Secondary | ICD-10-CM | POA: Diagnosis not present

## 2018-11-15 DIAGNOSIS — N2581 Secondary hyperparathyroidism of renal origin: Secondary | ICD-10-CM | POA: Diagnosis not present

## 2018-11-15 DIAGNOSIS — N186 End stage renal disease: Secondary | ICD-10-CM | POA: Diagnosis not present

## 2018-11-15 DIAGNOSIS — E876 Hypokalemia: Secondary | ICD-10-CM | POA: Diagnosis not present

## 2018-11-15 DIAGNOSIS — E1129 Type 2 diabetes mellitus with other diabetic kidney complication: Secondary | ICD-10-CM | POA: Diagnosis not present

## 2018-11-17 DIAGNOSIS — N186 End stage renal disease: Secondary | ICD-10-CM | POA: Diagnosis not present

## 2018-11-17 DIAGNOSIS — E876 Hypokalemia: Secondary | ICD-10-CM | POA: Diagnosis not present

## 2018-11-17 DIAGNOSIS — E1129 Type 2 diabetes mellitus with other diabetic kidney complication: Secondary | ICD-10-CM | POA: Diagnosis not present

## 2018-11-17 DIAGNOSIS — N2581 Secondary hyperparathyroidism of renal origin: Secondary | ICD-10-CM | POA: Diagnosis not present

## 2018-11-19 DIAGNOSIS — N2581 Secondary hyperparathyroidism of renal origin: Secondary | ICD-10-CM | POA: Diagnosis not present

## 2018-11-19 DIAGNOSIS — E876 Hypokalemia: Secondary | ICD-10-CM | POA: Diagnosis not present

## 2018-11-19 DIAGNOSIS — N186 End stage renal disease: Secondary | ICD-10-CM | POA: Diagnosis not present

## 2018-11-19 DIAGNOSIS — E1129 Type 2 diabetes mellitus with other diabetic kidney complication: Secondary | ICD-10-CM | POA: Diagnosis not present

## 2018-11-22 DIAGNOSIS — E1129 Type 2 diabetes mellitus with other diabetic kidney complication: Secondary | ICD-10-CM | POA: Diagnosis not present

## 2018-11-22 DIAGNOSIS — N186 End stage renal disease: Secondary | ICD-10-CM | POA: Diagnosis not present

## 2018-11-22 DIAGNOSIS — E876 Hypokalemia: Secondary | ICD-10-CM | POA: Diagnosis not present

## 2018-11-22 DIAGNOSIS — N2581 Secondary hyperparathyroidism of renal origin: Secondary | ICD-10-CM | POA: Diagnosis not present

## 2018-11-24 ENCOUNTER — Telehealth: Payer: Self-pay | Admitting: Internal Medicine

## 2018-11-24 DIAGNOSIS — E876 Hypokalemia: Secondary | ICD-10-CM | POA: Diagnosis not present

## 2018-11-24 DIAGNOSIS — N2581 Secondary hyperparathyroidism of renal origin: Secondary | ICD-10-CM | POA: Diagnosis not present

## 2018-11-24 DIAGNOSIS — N186 End stage renal disease: Secondary | ICD-10-CM | POA: Diagnosis not present

## 2018-11-24 DIAGNOSIS — E1129 Type 2 diabetes mellitus with other diabetic kidney complication: Secondary | ICD-10-CM | POA: Diagnosis not present

## 2018-11-24 NOTE — Telephone Encounter (Signed)
Per Dr. Blanch Media last ov note, he wants to see pt in the office in July. Pls advise if it is ok to schedule in-person appt.

## 2018-11-24 NOTE — Telephone Encounter (Signed)
In office visit sometime in [place] would be fine. Thanks

## 2018-11-24 NOTE — Telephone Encounter (Signed)
See note below and advise if pt needs to be inperson visit.

## 2018-11-25 NOTE — Telephone Encounter (Signed)
See comment below from Dr. [name].

## 2018-11-26 DIAGNOSIS — N186 End stage renal disease: Secondary | ICD-10-CM | POA: Diagnosis not present

## 2018-11-26 DIAGNOSIS — N2581 Secondary hyperparathyroidism of renal origin: Secondary | ICD-10-CM | POA: Diagnosis not present

## 2018-11-26 DIAGNOSIS — E876 Hypokalemia: Secondary | ICD-10-CM | POA: Diagnosis not present

## 2018-11-26 DIAGNOSIS — E1129 Type 2 diabetes mellitus with other diabetic kidney complication: Secondary | ICD-10-CM | POA: Diagnosis not present

## 2018-11-29 DIAGNOSIS — E1129 Type 2 diabetes mellitus with other diabetic kidney complication: Secondary | ICD-10-CM | POA: Diagnosis not present

## 2018-11-29 DIAGNOSIS — E876 Hypokalemia: Secondary | ICD-10-CM | POA: Diagnosis not present

## 2018-11-29 DIAGNOSIS — N2581 Secondary hyperparathyroidism of renal origin: Secondary | ICD-10-CM | POA: Diagnosis not present

## 2018-11-29 DIAGNOSIS — N186 End stage renal disease: Secondary | ICD-10-CM | POA: Diagnosis not present

## 2018-11-30 DIAGNOSIS — N186 End stage renal disease: Secondary | ICD-10-CM | POA: Diagnosis not present

## 2018-11-30 DIAGNOSIS — Z992 Dependence on renal dialysis: Secondary | ICD-10-CM | POA: Diagnosis not present

## 2018-11-30 DIAGNOSIS — E1129 Type 2 diabetes mellitus with other diabetic kidney complication: Secondary | ICD-10-CM | POA: Diagnosis not present

## 2018-12-01 DIAGNOSIS — N2581 Secondary hyperparathyroidism of renal origin: Secondary | ICD-10-CM | POA: Diagnosis not present

## 2018-12-01 DIAGNOSIS — E1129 Type 2 diabetes mellitus with other diabetic kidney complication: Secondary | ICD-10-CM | POA: Diagnosis not present

## 2018-12-01 DIAGNOSIS — N186 End stage renal disease: Secondary | ICD-10-CM | POA: Diagnosis not present

## 2018-12-01 DIAGNOSIS — E876 Hypokalemia: Secondary | ICD-10-CM | POA: Diagnosis not present

## 2018-12-01 DIAGNOSIS — D509 Iron deficiency anemia, unspecified: Secondary | ICD-10-CM | POA: Diagnosis not present

## 2018-12-03 DIAGNOSIS — D509 Iron deficiency anemia, unspecified: Secondary | ICD-10-CM | POA: Diagnosis not present

## 2018-12-03 DIAGNOSIS — N2581 Secondary hyperparathyroidism of renal origin: Secondary | ICD-10-CM | POA: Diagnosis not present

## 2018-12-03 DIAGNOSIS — N186 End stage renal disease: Secondary | ICD-10-CM | POA: Diagnosis not present

## 2018-12-03 DIAGNOSIS — E876 Hypokalemia: Secondary | ICD-10-CM | POA: Diagnosis not present

## 2018-12-03 DIAGNOSIS — E1129 Type 2 diabetes mellitus with other diabetic kidney complication: Secondary | ICD-10-CM | POA: Diagnosis not present

## 2018-12-06 DIAGNOSIS — E1129 Type 2 diabetes mellitus with other diabetic kidney complication: Secondary | ICD-10-CM | POA: Diagnosis not present

## 2018-12-06 DIAGNOSIS — N186 End stage renal disease: Secondary | ICD-10-CM | POA: Diagnosis not present

## 2018-12-06 DIAGNOSIS — E876 Hypokalemia: Secondary | ICD-10-CM | POA: Diagnosis not present

## 2018-12-06 DIAGNOSIS — N2581 Secondary hyperparathyroidism of renal origin: Secondary | ICD-10-CM | POA: Diagnosis not present

## 2018-12-06 DIAGNOSIS — D509 Iron deficiency anemia, unspecified: Secondary | ICD-10-CM | POA: Diagnosis not present

## 2018-12-08 DIAGNOSIS — N186 End stage renal disease: Secondary | ICD-10-CM | POA: Diagnosis not present

## 2018-12-08 DIAGNOSIS — N2581 Secondary hyperparathyroidism of renal origin: Secondary | ICD-10-CM | POA: Diagnosis not present

## 2018-12-08 DIAGNOSIS — E1129 Type 2 diabetes mellitus with other diabetic kidney complication: Secondary | ICD-10-CM | POA: Diagnosis not present

## 2018-12-08 DIAGNOSIS — D509 Iron deficiency anemia, unspecified: Secondary | ICD-10-CM | POA: Diagnosis not present

## 2018-12-08 DIAGNOSIS — E876 Hypokalemia: Secondary | ICD-10-CM | POA: Diagnosis not present

## 2018-12-10 DIAGNOSIS — D509 Iron deficiency anemia, unspecified: Secondary | ICD-10-CM | POA: Diagnosis not present

## 2018-12-10 DIAGNOSIS — E876 Hypokalemia: Secondary | ICD-10-CM | POA: Diagnosis not present

## 2018-12-10 DIAGNOSIS — E1129 Type 2 diabetes mellitus with other diabetic kidney complication: Secondary | ICD-10-CM | POA: Diagnosis not present

## 2018-12-10 DIAGNOSIS — N186 End stage renal disease: Secondary | ICD-10-CM | POA: Diagnosis not present

## 2018-12-10 DIAGNOSIS — N2581 Secondary hyperparathyroidism of renal origin: Secondary | ICD-10-CM | POA: Diagnosis not present

## 2018-12-13 DIAGNOSIS — N186 End stage renal disease: Secondary | ICD-10-CM | POA: Diagnosis not present

## 2018-12-13 DIAGNOSIS — N2581 Secondary hyperparathyroidism of renal origin: Secondary | ICD-10-CM | POA: Diagnosis not present

## 2018-12-13 DIAGNOSIS — E1129 Type 2 diabetes mellitus with other diabetic kidney complication: Secondary | ICD-10-CM | POA: Diagnosis not present

## 2018-12-13 DIAGNOSIS — D509 Iron deficiency anemia, unspecified: Secondary | ICD-10-CM | POA: Diagnosis not present

## 2018-12-13 DIAGNOSIS — E876 Hypokalemia: Secondary | ICD-10-CM | POA: Diagnosis not present

## 2018-12-14 ENCOUNTER — Other Ambulatory Visit: Payer: Self-pay

## 2018-12-14 ENCOUNTER — Encounter: Payer: Self-pay | Admitting: Podiatry

## 2018-12-14 ENCOUNTER — Ambulatory Visit (INDEPENDENT_AMBULATORY_CARE_PROVIDER_SITE_OTHER): Payer: Medicare Other | Admitting: Podiatry

## 2018-12-14 VITALS — Temp 97.9°F

## 2018-12-14 DIAGNOSIS — Z89431 Acquired absence of right foot: Secondary | ICD-10-CM | POA: Diagnosis not present

## 2018-12-14 DIAGNOSIS — I739 Peripheral vascular disease, unspecified: Secondary | ICD-10-CM

## 2018-12-14 DIAGNOSIS — B351 Tinea unguium: Secondary | ICD-10-CM | POA: Diagnosis not present

## 2018-12-14 DIAGNOSIS — Z7901 Long term (current) use of anticoagulants: Secondary | ICD-10-CM | POA: Diagnosis not present

## 2018-12-14 DIAGNOSIS — E0842 Diabetes mellitus due to underlying condition with diabetic polyneuropathy: Secondary | ICD-10-CM | POA: Diagnosis not present

## 2018-12-14 DIAGNOSIS — M79676 Pain in unspecified toe(s): Secondary | ICD-10-CM | POA: Diagnosis not present

## 2018-12-14 DIAGNOSIS — I4891 Unspecified atrial fibrillation: Secondary | ICD-10-CM | POA: Diagnosis not present

## 2018-12-14 NOTE — Progress Notes (Signed)
Complaint:  Visit Type: Patient returns to my office for continued preventative foot care services. Complaint: Patient states" my nails have grown long and thick and become painful to walk and wear shoes on his left foot.  Patient has been diagnosed with DM with angiopathy.  Patient has had transmetatarsal amputation performed toes right foot.. The patient presents for preventative foot care services. No changes to ROS  Podiatric Exam: Vascular: dorsalis pedis and posterior tibial pulses are not  palpable bilateral... Capillary return is immediate. Temperature gradient is WNL. Skin turgor WNL  Significant swelling feet  B/L. Sensorium: Normal Semmes Weinstein monofilament test. Normal tactile sensation left foot. Nail Exam: Pt has thick disfigured discolored nails with subungual debris noted  entire nail hallux through fifth toenails left foot. Ulcer Exam: There is no evidence of ulcer or pre-ulcerative changes or infection. Orthopedic Exam: Muscle tone and strength are WNL. No limitations in general ROM. No crepitus or effusions noted. Foot type and digits show no abnormalities. Bony prominences are unremarkable. TMA right foot. Skin: No Porokeratosis. No infection or ulcers  Diagnosis:  Onychomycosis, ,, pain in left toes,  TMA right foot.  Treatment & Plan Procedures and Treatment: Consent by patient was obtained for treatment procedures.   Debridement of mycotic and hypertrophic toenails, 1 through 5 left foot  and clearing of subungual debris. No ulceration, no infection noted.  Return Visit-Office Procedure: Patient instructed to return to the office for a follow up visit 3 months for continued evaluation and treatment.    Gardiner Barefoot DPM

## 2018-12-15 DIAGNOSIS — N2581 Secondary hyperparathyroidism of renal origin: Secondary | ICD-10-CM | POA: Diagnosis not present

## 2018-12-15 DIAGNOSIS — N186 End stage renal disease: Secondary | ICD-10-CM | POA: Diagnosis not present

## 2018-12-15 DIAGNOSIS — D509 Iron deficiency anemia, unspecified: Secondary | ICD-10-CM | POA: Diagnosis not present

## 2018-12-15 DIAGNOSIS — E1129 Type 2 diabetes mellitus with other diabetic kidney complication: Secondary | ICD-10-CM | POA: Diagnosis not present

## 2018-12-15 DIAGNOSIS — E876 Hypokalemia: Secondary | ICD-10-CM | POA: Diagnosis not present

## 2018-12-17 DIAGNOSIS — E1129 Type 2 diabetes mellitus with other diabetic kidney complication: Secondary | ICD-10-CM | POA: Diagnosis not present

## 2018-12-17 DIAGNOSIS — E876 Hypokalemia: Secondary | ICD-10-CM | POA: Diagnosis not present

## 2018-12-17 DIAGNOSIS — D509 Iron deficiency anemia, unspecified: Secondary | ICD-10-CM | POA: Diagnosis not present

## 2018-12-17 DIAGNOSIS — N2581 Secondary hyperparathyroidism of renal origin: Secondary | ICD-10-CM | POA: Diagnosis not present

## 2018-12-17 DIAGNOSIS — N186 End stage renal disease: Secondary | ICD-10-CM | POA: Diagnosis not present

## 2018-12-20 DIAGNOSIS — N2581 Secondary hyperparathyroidism of renal origin: Secondary | ICD-10-CM | POA: Diagnosis not present

## 2018-12-20 DIAGNOSIS — N186 End stage renal disease: Secondary | ICD-10-CM | POA: Diagnosis not present

## 2018-12-20 DIAGNOSIS — E1129 Type 2 diabetes mellitus with other diabetic kidney complication: Secondary | ICD-10-CM | POA: Diagnosis not present

## 2018-12-20 DIAGNOSIS — D509 Iron deficiency anemia, unspecified: Secondary | ICD-10-CM | POA: Diagnosis not present

## 2018-12-20 DIAGNOSIS — E876 Hypokalemia: Secondary | ICD-10-CM | POA: Diagnosis not present

## 2018-12-22 DIAGNOSIS — N2581 Secondary hyperparathyroidism of renal origin: Secondary | ICD-10-CM | POA: Diagnosis not present

## 2018-12-22 DIAGNOSIS — E1129 Type 2 diabetes mellitus with other diabetic kidney complication: Secondary | ICD-10-CM | POA: Diagnosis not present

## 2018-12-22 DIAGNOSIS — E876 Hypokalemia: Secondary | ICD-10-CM | POA: Diagnosis not present

## 2018-12-22 DIAGNOSIS — D509 Iron deficiency anemia, unspecified: Secondary | ICD-10-CM | POA: Diagnosis not present

## 2018-12-22 DIAGNOSIS — N186 End stage renal disease: Secondary | ICD-10-CM | POA: Diagnosis not present

## 2018-12-24 DIAGNOSIS — D509 Iron deficiency anemia, unspecified: Secondary | ICD-10-CM | POA: Diagnosis not present

## 2018-12-24 DIAGNOSIS — N2581 Secondary hyperparathyroidism of renal origin: Secondary | ICD-10-CM | POA: Diagnosis not present

## 2018-12-24 DIAGNOSIS — N186 End stage renal disease: Secondary | ICD-10-CM | POA: Diagnosis not present

## 2018-12-24 DIAGNOSIS — E1129 Type 2 diabetes mellitus with other diabetic kidney complication: Secondary | ICD-10-CM | POA: Diagnosis not present

## 2018-12-24 DIAGNOSIS — E876 Hypokalemia: Secondary | ICD-10-CM | POA: Diagnosis not present

## 2018-12-27 DIAGNOSIS — N186 End stage renal disease: Secondary | ICD-10-CM | POA: Diagnosis not present

## 2018-12-27 DIAGNOSIS — N2581 Secondary hyperparathyroidism of renal origin: Secondary | ICD-10-CM | POA: Diagnosis not present

## 2018-12-27 DIAGNOSIS — E1129 Type 2 diabetes mellitus with other diabetic kidney complication: Secondary | ICD-10-CM | POA: Diagnosis not present

## 2018-12-27 DIAGNOSIS — D509 Iron deficiency anemia, unspecified: Secondary | ICD-10-CM | POA: Diagnosis not present

## 2018-12-27 DIAGNOSIS — E876 Hypokalemia: Secondary | ICD-10-CM | POA: Diagnosis not present

## 2018-12-27 NOTE — Telephone Encounter (Signed)
Left message to return call to schedule an in person office visit.

## 2018-12-28 ENCOUNTER — Telehealth: Payer: Self-pay | Admitting: Internal Medicine

## 2018-12-28 NOTE — Telephone Encounter (Signed)
No availability with Dr. [name] for August.  Pt aware and will call back in a week to schedule for Sept appt.

## 2018-12-28 NOTE — Telephone Encounter (Signed)
See follow up phone note from 12-28-2018. No Aug dates available. Will wait till Sept schedule is available.

## 2018-12-29 DIAGNOSIS — N186 End stage renal disease: Secondary | ICD-10-CM | POA: Diagnosis not present

## 2018-12-29 DIAGNOSIS — D509 Iron deficiency anemia, unspecified: Secondary | ICD-10-CM | POA: Diagnosis not present

## 2018-12-29 DIAGNOSIS — E876 Hypokalemia: Secondary | ICD-10-CM | POA: Diagnosis not present

## 2018-12-29 DIAGNOSIS — E1129 Type 2 diabetes mellitus with other diabetic kidney complication: Secondary | ICD-10-CM | POA: Diagnosis not present

## 2018-12-29 DIAGNOSIS — N2581 Secondary hyperparathyroidism of renal origin: Secondary | ICD-10-CM | POA: Diagnosis not present

## 2018-12-30 NOTE — Telephone Encounter (Signed)
I have contacted patient and have scheduled him for office visit on 01/30/19 at 940 am with Dr [name] as recommended by Dr [name]. Patient is agreeable to this and verbalizes understanding.

## 2018-12-31 DIAGNOSIS — Z992 Dependence on renal dialysis: Secondary | ICD-10-CM | POA: Diagnosis not present

## 2018-12-31 DIAGNOSIS — N186 End stage renal disease: Secondary | ICD-10-CM | POA: Diagnosis not present

## 2018-12-31 DIAGNOSIS — E1129 Type 2 diabetes mellitus with other diabetic kidney complication: Secondary | ICD-10-CM | POA: Diagnosis not present

## 2018-12-31 DIAGNOSIS — N2581 Secondary hyperparathyroidism of renal origin: Secondary | ICD-10-CM | POA: Diagnosis not present

## 2019-01-02 ENCOUNTER — Other Ambulatory Visit: Payer: Self-pay

## 2019-01-02 ENCOUNTER — Encounter (INDEPENDENT_AMBULATORY_CARE_PROVIDER_SITE_OTHER): Payer: Medicare Other | Admitting: Ophthalmology

## 2019-01-02 DIAGNOSIS — E113393 Type 2 diabetes mellitus with moderate nonproliferative diabetic retinopathy without macular edema, bilateral: Secondary | ICD-10-CM | POA: Diagnosis not present

## 2019-01-02 DIAGNOSIS — E11319 Type 2 diabetes mellitus with unspecified diabetic retinopathy without macular edema: Secondary | ICD-10-CM | POA: Diagnosis not present

## 2019-01-02 DIAGNOSIS — I10 Essential (primary) hypertension: Secondary | ICD-10-CM | POA: Diagnosis not present

## 2019-01-02 DIAGNOSIS — H35033 Hypertensive retinopathy, bilateral: Secondary | ICD-10-CM

## 2019-01-03 DIAGNOSIS — Z992 Dependence on renal dialysis: Secondary | ICD-10-CM | POA: Diagnosis not present

## 2019-01-03 DIAGNOSIS — N2581 Secondary hyperparathyroidism of renal origin: Secondary | ICD-10-CM | POA: Diagnosis not present

## 2019-01-03 DIAGNOSIS — N186 End stage renal disease: Secondary | ICD-10-CM | POA: Diagnosis not present

## 2019-01-03 DIAGNOSIS — E1129 Type 2 diabetes mellitus with other diabetic kidney complication: Secondary | ICD-10-CM | POA: Diagnosis not present

## 2019-01-05 DIAGNOSIS — N2581 Secondary hyperparathyroidism of renal origin: Secondary | ICD-10-CM | POA: Diagnosis not present

## 2019-01-05 DIAGNOSIS — Z992 Dependence on renal dialysis: Secondary | ICD-10-CM | POA: Diagnosis not present

## 2019-01-05 DIAGNOSIS — E1129 Type 2 diabetes mellitus with other diabetic kidney complication: Secondary | ICD-10-CM | POA: Diagnosis not present

## 2019-01-05 DIAGNOSIS — N186 End stage renal disease: Secondary | ICD-10-CM | POA: Diagnosis not present

## 2019-01-07 DIAGNOSIS — Z992 Dependence on renal dialysis: Secondary | ICD-10-CM | POA: Diagnosis not present

## 2019-01-07 DIAGNOSIS — N2581 Secondary hyperparathyroidism of renal origin: Secondary | ICD-10-CM | POA: Diagnosis not present

## 2019-01-07 DIAGNOSIS — E1129 Type 2 diabetes mellitus with other diabetic kidney complication: Secondary | ICD-10-CM | POA: Diagnosis not present

## 2019-01-07 DIAGNOSIS — N186 End stage renal disease: Secondary | ICD-10-CM | POA: Diagnosis not present

## 2019-01-10 DIAGNOSIS — N186 End stage renal disease: Secondary | ICD-10-CM | POA: Diagnosis not present

## 2019-01-10 DIAGNOSIS — Z992 Dependence on renal dialysis: Secondary | ICD-10-CM | POA: Diagnosis not present

## 2019-01-10 DIAGNOSIS — N2581 Secondary hyperparathyroidism of renal origin: Secondary | ICD-10-CM | POA: Diagnosis not present

## 2019-01-10 DIAGNOSIS — E1129 Type 2 diabetes mellitus with other diabetic kidney complication: Secondary | ICD-10-CM | POA: Diagnosis not present

## 2019-01-12 DIAGNOSIS — N186 End stage renal disease: Secondary | ICD-10-CM | POA: Diagnosis not present

## 2019-01-12 DIAGNOSIS — Z992 Dependence on renal dialysis: Secondary | ICD-10-CM | POA: Diagnosis not present

## 2019-01-12 DIAGNOSIS — E1129 Type 2 diabetes mellitus with other diabetic kidney complication: Secondary | ICD-10-CM | POA: Diagnosis not present

## 2019-01-12 DIAGNOSIS — N2581 Secondary hyperparathyroidism of renal origin: Secondary | ICD-10-CM | POA: Diagnosis not present

## 2019-01-14 DIAGNOSIS — Z992 Dependence on renal dialysis: Secondary | ICD-10-CM | POA: Diagnosis not present

## 2019-01-14 DIAGNOSIS — N186 End stage renal disease: Secondary | ICD-10-CM | POA: Diagnosis not present

## 2019-01-14 DIAGNOSIS — E1129 Type 2 diabetes mellitus with other diabetic kidney complication: Secondary | ICD-10-CM | POA: Diagnosis not present

## 2019-01-14 DIAGNOSIS — N2581 Secondary hyperparathyroidism of renal origin: Secondary | ICD-10-CM | POA: Diagnosis not present

## 2019-01-17 DIAGNOSIS — Z992 Dependence on renal dialysis: Secondary | ICD-10-CM | POA: Diagnosis not present

## 2019-01-17 DIAGNOSIS — N2581 Secondary hyperparathyroidism of renal origin: Secondary | ICD-10-CM | POA: Diagnosis not present

## 2019-01-17 DIAGNOSIS — N186 End stage renal disease: Secondary | ICD-10-CM | POA: Diagnosis not present

## 2019-01-17 DIAGNOSIS — E1129 Type 2 diabetes mellitus with other diabetic kidney complication: Secondary | ICD-10-CM | POA: Diagnosis not present

## 2019-01-19 DIAGNOSIS — E1129 Type 2 diabetes mellitus with other diabetic kidney complication: Secondary | ICD-10-CM | POA: Diagnosis not present

## 2019-01-19 DIAGNOSIS — N2581 Secondary hyperparathyroidism of renal origin: Secondary | ICD-10-CM | POA: Diagnosis not present

## 2019-01-19 DIAGNOSIS — N186 End stage renal disease: Secondary | ICD-10-CM | POA: Diagnosis not present

## 2019-01-19 DIAGNOSIS — Z992 Dependence on renal dialysis: Secondary | ICD-10-CM | POA: Diagnosis not present

## 2019-01-21 DIAGNOSIS — Z992 Dependence on renal dialysis: Secondary | ICD-10-CM | POA: Diagnosis not present

## 2019-01-21 DIAGNOSIS — N186 End stage renal disease: Secondary | ICD-10-CM | POA: Diagnosis not present

## 2019-01-21 DIAGNOSIS — E1129 Type 2 diabetes mellitus with other diabetic kidney complication: Secondary | ICD-10-CM | POA: Diagnosis not present

## 2019-01-21 DIAGNOSIS — N2581 Secondary hyperparathyroidism of renal origin: Secondary | ICD-10-CM | POA: Diagnosis not present

## 2019-01-24 DIAGNOSIS — N2581 Secondary hyperparathyroidism of renal origin: Secondary | ICD-10-CM | POA: Diagnosis not present

## 2019-01-24 DIAGNOSIS — E1129 Type 2 diabetes mellitus with other diabetic kidney complication: Secondary | ICD-10-CM | POA: Diagnosis not present

## 2019-01-24 DIAGNOSIS — Z992 Dependence on renal dialysis: Secondary | ICD-10-CM | POA: Diagnosis not present

## 2019-01-24 DIAGNOSIS — N186 End stage renal disease: Secondary | ICD-10-CM | POA: Diagnosis not present

## 2019-01-25 DIAGNOSIS — I4891 Unspecified atrial fibrillation: Secondary | ICD-10-CM | POA: Diagnosis not present

## 2019-01-25 DIAGNOSIS — Z7901 Long term (current) use of anticoagulants: Secondary | ICD-10-CM | POA: Diagnosis not present

## 2019-01-25 DIAGNOSIS — N186 End stage renal disease: Secondary | ICD-10-CM | POA: Diagnosis not present

## 2019-01-25 DIAGNOSIS — T82858A Stenosis of vascular prosthetic devices, implants and grafts, initial encounter: Secondary | ICD-10-CM | POA: Diagnosis not present

## 2019-01-25 DIAGNOSIS — I871 Compression of vein: Secondary | ICD-10-CM | POA: Diagnosis not present

## 2019-01-25 DIAGNOSIS — Z992 Dependence on renal dialysis: Secondary | ICD-10-CM | POA: Diagnosis not present

## 2019-01-26 DIAGNOSIS — E1129 Type 2 diabetes mellitus with other diabetic kidney complication: Secondary | ICD-10-CM | POA: Diagnosis not present

## 2019-01-26 DIAGNOSIS — N186 End stage renal disease: Secondary | ICD-10-CM | POA: Diagnosis not present

## 2019-01-26 DIAGNOSIS — Z992 Dependence on renal dialysis: Secondary | ICD-10-CM | POA: Diagnosis not present

## 2019-01-26 DIAGNOSIS — N2581 Secondary hyperparathyroidism of renal origin: Secondary | ICD-10-CM | POA: Diagnosis not present

## 2019-01-28 DIAGNOSIS — N2581 Secondary hyperparathyroidism of renal origin: Secondary | ICD-10-CM | POA: Diagnosis not present

## 2019-01-28 DIAGNOSIS — E1129 Type 2 diabetes mellitus with other diabetic kidney complication: Secondary | ICD-10-CM | POA: Diagnosis not present

## 2019-01-28 DIAGNOSIS — Z992 Dependence on renal dialysis: Secondary | ICD-10-CM | POA: Diagnosis not present

## 2019-01-28 DIAGNOSIS — N186 End stage renal disease: Secondary | ICD-10-CM | POA: Diagnosis not present

## 2019-01-30 ENCOUNTER — Ambulatory Visit: Payer: Medicare Other | Admitting: Internal Medicine

## 2019-01-31 DIAGNOSIS — N186 End stage renal disease: Secondary | ICD-10-CM | POA: Diagnosis not present

## 2019-01-31 DIAGNOSIS — Z992 Dependence on renal dialysis: Secondary | ICD-10-CM | POA: Diagnosis not present

## 2019-01-31 DIAGNOSIS — N2581 Secondary hyperparathyroidism of renal origin: Secondary | ICD-10-CM | POA: Diagnosis not present

## 2019-01-31 DIAGNOSIS — Z23 Encounter for immunization: Secondary | ICD-10-CM | POA: Diagnosis not present

## 2019-01-31 DIAGNOSIS — E1129 Type 2 diabetes mellitus with other diabetic kidney complication: Secondary | ICD-10-CM | POA: Diagnosis not present

## 2019-02-02 DIAGNOSIS — E1129 Type 2 diabetes mellitus with other diabetic kidney complication: Secondary | ICD-10-CM | POA: Diagnosis not present

## 2019-02-02 DIAGNOSIS — Z992 Dependence on renal dialysis: Secondary | ICD-10-CM | POA: Diagnosis not present

## 2019-02-02 DIAGNOSIS — N2581 Secondary hyperparathyroidism of renal origin: Secondary | ICD-10-CM | POA: Diagnosis not present

## 2019-02-02 DIAGNOSIS — Z23 Encounter for immunization: Secondary | ICD-10-CM | POA: Diagnosis not present

## 2019-02-02 DIAGNOSIS — N186 End stage renal disease: Secondary | ICD-10-CM | POA: Diagnosis not present

## 2019-02-04 DIAGNOSIS — N186 End stage renal disease: Secondary | ICD-10-CM | POA: Diagnosis not present

## 2019-02-04 DIAGNOSIS — Z992 Dependence on renal dialysis: Secondary | ICD-10-CM | POA: Diagnosis not present

## 2019-02-04 DIAGNOSIS — Z23 Encounter for immunization: Secondary | ICD-10-CM | POA: Diagnosis not present

## 2019-02-04 DIAGNOSIS — N2581 Secondary hyperparathyroidism of renal origin: Secondary | ICD-10-CM | POA: Diagnosis not present

## 2019-02-04 DIAGNOSIS — E1129 Type 2 diabetes mellitus with other diabetic kidney complication: Secondary | ICD-10-CM | POA: Diagnosis not present

## 2019-02-07 DIAGNOSIS — E1129 Type 2 diabetes mellitus with other diabetic kidney complication: Secondary | ICD-10-CM | POA: Diagnosis not present

## 2019-02-07 DIAGNOSIS — Z23 Encounter for immunization: Secondary | ICD-10-CM | POA: Diagnosis not present

## 2019-02-07 DIAGNOSIS — Z992 Dependence on renal dialysis: Secondary | ICD-10-CM | POA: Diagnosis not present

## 2019-02-07 DIAGNOSIS — N186 End stage renal disease: Secondary | ICD-10-CM | POA: Diagnosis not present

## 2019-02-07 DIAGNOSIS — N2581 Secondary hyperparathyroidism of renal origin: Secondary | ICD-10-CM | POA: Diagnosis not present

## 2019-02-09 DIAGNOSIS — Z992 Dependence on renal dialysis: Secondary | ICD-10-CM | POA: Diagnosis not present

## 2019-02-09 DIAGNOSIS — N186 End stage renal disease: Secondary | ICD-10-CM | POA: Diagnosis not present

## 2019-02-09 DIAGNOSIS — E1129 Type 2 diabetes mellitus with other diabetic kidney complication: Secondary | ICD-10-CM | POA: Diagnosis not present

## 2019-02-09 DIAGNOSIS — N2581 Secondary hyperparathyroidism of renal origin: Secondary | ICD-10-CM | POA: Diagnosis not present

## 2019-02-09 DIAGNOSIS — Z23 Encounter for immunization: Secondary | ICD-10-CM | POA: Diagnosis not present

## 2019-02-11 DIAGNOSIS — N2581 Secondary hyperparathyroidism of renal origin: Secondary | ICD-10-CM | POA: Diagnosis not present

## 2019-02-11 DIAGNOSIS — Z992 Dependence on renal dialysis: Secondary | ICD-10-CM | POA: Diagnosis not present

## 2019-02-11 DIAGNOSIS — N186 End stage renal disease: Secondary | ICD-10-CM | POA: Diagnosis not present

## 2019-02-11 DIAGNOSIS — Z23 Encounter for immunization: Secondary | ICD-10-CM | POA: Diagnosis not present

## 2019-02-11 DIAGNOSIS — E1129 Type 2 diabetes mellitus with other diabetic kidney complication: Secondary | ICD-10-CM | POA: Diagnosis not present

## 2019-02-14 DIAGNOSIS — E1129 Type 2 diabetes mellitus with other diabetic kidney complication: Secondary | ICD-10-CM | POA: Diagnosis not present

## 2019-02-14 DIAGNOSIS — N186 End stage renal disease: Secondary | ICD-10-CM | POA: Diagnosis not present

## 2019-02-14 DIAGNOSIS — Z992 Dependence on renal dialysis: Secondary | ICD-10-CM | POA: Diagnosis not present

## 2019-02-14 DIAGNOSIS — Z23 Encounter for immunization: Secondary | ICD-10-CM | POA: Diagnosis not present

## 2019-02-14 DIAGNOSIS — N2581 Secondary hyperparathyroidism of renal origin: Secondary | ICD-10-CM | POA: Diagnosis not present

## 2019-02-15 DIAGNOSIS — T886XXA Anaphylactic reaction due to adverse effect of correct drug or medicament properly administered, initial encounter: Secondary | ICD-10-CM | POA: Insufficient documentation

## 2019-02-16 DIAGNOSIS — Z23 Encounter for immunization: Secondary | ICD-10-CM | POA: Diagnosis not present

## 2019-02-16 DIAGNOSIS — E1129 Type 2 diabetes mellitus with other diabetic kidney complication: Secondary | ICD-10-CM | POA: Diagnosis not present

## 2019-02-16 DIAGNOSIS — N186 End stage renal disease: Secondary | ICD-10-CM | POA: Diagnosis not present

## 2019-02-16 DIAGNOSIS — N2581 Secondary hyperparathyroidism of renal origin: Secondary | ICD-10-CM | POA: Diagnosis not present

## 2019-02-16 DIAGNOSIS — Z992 Dependence on renal dialysis: Secondary | ICD-10-CM | POA: Diagnosis not present

## 2019-02-18 DIAGNOSIS — Z992 Dependence on renal dialysis: Secondary | ICD-10-CM | POA: Diagnosis not present

## 2019-02-18 DIAGNOSIS — N186 End stage renal disease: Secondary | ICD-10-CM | POA: Diagnosis not present

## 2019-02-18 DIAGNOSIS — Z23 Encounter for immunization: Secondary | ICD-10-CM | POA: Diagnosis not present

## 2019-02-18 DIAGNOSIS — N2581 Secondary hyperparathyroidism of renal origin: Secondary | ICD-10-CM | POA: Diagnosis not present

## 2019-02-18 DIAGNOSIS — E1129 Type 2 diabetes mellitus with other diabetic kidney complication: Secondary | ICD-10-CM | POA: Diagnosis not present

## 2019-02-21 DIAGNOSIS — N186 End stage renal disease: Secondary | ICD-10-CM | POA: Diagnosis not present

## 2019-02-21 DIAGNOSIS — E1129 Type 2 diabetes mellitus with other diabetic kidney complication: Secondary | ICD-10-CM | POA: Diagnosis not present

## 2019-02-21 DIAGNOSIS — Z992 Dependence on renal dialysis: Secondary | ICD-10-CM | POA: Diagnosis not present

## 2019-02-21 DIAGNOSIS — Z23 Encounter for immunization: Secondary | ICD-10-CM | POA: Diagnosis not present

## 2019-02-21 DIAGNOSIS — N2581 Secondary hyperparathyroidism of renal origin: Secondary | ICD-10-CM | POA: Diagnosis not present

## 2019-02-23 DIAGNOSIS — E1129 Type 2 diabetes mellitus with other diabetic kidney complication: Secondary | ICD-10-CM | POA: Diagnosis not present

## 2019-02-23 DIAGNOSIS — Z23 Encounter for immunization: Secondary | ICD-10-CM | POA: Diagnosis not present

## 2019-02-23 DIAGNOSIS — N186 End stage renal disease: Secondary | ICD-10-CM | POA: Diagnosis not present

## 2019-02-23 DIAGNOSIS — N2581 Secondary hyperparathyroidism of renal origin: Secondary | ICD-10-CM | POA: Diagnosis not present

## 2019-02-23 DIAGNOSIS — Z992 Dependence on renal dialysis: Secondary | ICD-10-CM | POA: Diagnosis not present

## 2019-02-25 DIAGNOSIS — E1129 Type 2 diabetes mellitus with other diabetic kidney complication: Secondary | ICD-10-CM | POA: Diagnosis not present

## 2019-02-25 DIAGNOSIS — Z23 Encounter for immunization: Secondary | ICD-10-CM | POA: Diagnosis not present

## 2019-02-25 DIAGNOSIS — N2581 Secondary hyperparathyroidism of renal origin: Secondary | ICD-10-CM | POA: Diagnosis not present

## 2019-02-25 DIAGNOSIS — N186 End stage renal disease: Secondary | ICD-10-CM | POA: Diagnosis not present

## 2019-02-25 DIAGNOSIS — Z992 Dependence on renal dialysis: Secondary | ICD-10-CM | POA: Diagnosis not present

## 2019-02-28 DIAGNOSIS — E1129 Type 2 diabetes mellitus with other diabetic kidney complication: Secondary | ICD-10-CM | POA: Diagnosis not present

## 2019-02-28 DIAGNOSIS — N186 End stage renal disease: Secondary | ICD-10-CM | POA: Diagnosis not present

## 2019-02-28 DIAGNOSIS — Z992 Dependence on renal dialysis: Secondary | ICD-10-CM | POA: Diagnosis not present

## 2019-02-28 DIAGNOSIS — Z23 Encounter for immunization: Secondary | ICD-10-CM | POA: Diagnosis not present

## 2019-02-28 DIAGNOSIS — N2581 Secondary hyperparathyroidism of renal origin: Secondary | ICD-10-CM | POA: Diagnosis not present

## 2019-03-02 DIAGNOSIS — N186 End stage renal disease: Secondary | ICD-10-CM | POA: Diagnosis not present

## 2019-03-02 DIAGNOSIS — N2581 Secondary hyperparathyroidism of renal origin: Secondary | ICD-10-CM | POA: Diagnosis not present

## 2019-03-02 DIAGNOSIS — Z992 Dependence on renal dialysis: Secondary | ICD-10-CM | POA: Diagnosis not present

## 2019-03-02 DIAGNOSIS — E1129 Type 2 diabetes mellitus with other diabetic kidney complication: Secondary | ICD-10-CM | POA: Diagnosis not present

## 2019-03-02 DIAGNOSIS — Z539 Procedure and treatment not carried out, unspecified reason: Secondary | ICD-10-CM | POA: Diagnosis not present

## 2019-03-04 DIAGNOSIS — Z992 Dependence on renal dialysis: Secondary | ICD-10-CM | POA: Diagnosis not present

## 2019-03-04 DIAGNOSIS — N2581 Secondary hyperparathyroidism of renal origin: Secondary | ICD-10-CM | POA: Diagnosis not present

## 2019-03-04 DIAGNOSIS — N186 End stage renal disease: Secondary | ICD-10-CM | POA: Diagnosis not present

## 2019-03-04 DIAGNOSIS — E1129 Type 2 diabetes mellitus with other diabetic kidney complication: Secondary | ICD-10-CM | POA: Diagnosis not present

## 2019-03-04 DIAGNOSIS — Z539 Procedure and treatment not carried out, unspecified reason: Secondary | ICD-10-CM | POA: Diagnosis not present

## 2019-03-07 DIAGNOSIS — N2581 Secondary hyperparathyroidism of renal origin: Secondary | ICD-10-CM | POA: Diagnosis not present

## 2019-03-07 DIAGNOSIS — Z539 Procedure and treatment not carried out, unspecified reason: Secondary | ICD-10-CM | POA: Diagnosis not present

## 2019-03-07 DIAGNOSIS — E1129 Type 2 diabetes mellitus with other diabetic kidney complication: Secondary | ICD-10-CM | POA: Diagnosis not present

## 2019-03-07 DIAGNOSIS — Z992 Dependence on renal dialysis: Secondary | ICD-10-CM | POA: Diagnosis not present

## 2019-03-07 DIAGNOSIS — N186 End stage renal disease: Secondary | ICD-10-CM | POA: Diagnosis not present

## 2019-03-08 DIAGNOSIS — Z7901 Long term (current) use of anticoagulants: Secondary | ICD-10-CM | POA: Diagnosis not present

## 2019-03-08 DIAGNOSIS — I4891 Unspecified atrial fibrillation: Secondary | ICD-10-CM | POA: Diagnosis not present

## 2019-03-09 DIAGNOSIS — E1129 Type 2 diabetes mellitus with other diabetic kidney complication: Secondary | ICD-10-CM | POA: Diagnosis not present

## 2019-03-09 DIAGNOSIS — Z992 Dependence on renal dialysis: Secondary | ICD-10-CM | POA: Diagnosis not present

## 2019-03-09 DIAGNOSIS — Z539 Procedure and treatment not carried out, unspecified reason: Secondary | ICD-10-CM | POA: Diagnosis not present

## 2019-03-09 DIAGNOSIS — N186 End stage renal disease: Secondary | ICD-10-CM | POA: Diagnosis not present

## 2019-03-09 DIAGNOSIS — N2581 Secondary hyperparathyroidism of renal origin: Secondary | ICD-10-CM | POA: Diagnosis not present

## 2019-03-11 DIAGNOSIS — E1129 Type 2 diabetes mellitus with other diabetic kidney complication: Secondary | ICD-10-CM | POA: Diagnosis not present

## 2019-03-11 DIAGNOSIS — Z992 Dependence on renal dialysis: Secondary | ICD-10-CM | POA: Diagnosis not present

## 2019-03-11 DIAGNOSIS — N2581 Secondary hyperparathyroidism of renal origin: Secondary | ICD-10-CM | POA: Diagnosis not present

## 2019-03-11 DIAGNOSIS — N186 End stage renal disease: Secondary | ICD-10-CM | POA: Diagnosis not present

## 2019-03-11 DIAGNOSIS — Z539 Procedure and treatment not carried out, unspecified reason: Secondary | ICD-10-CM | POA: Diagnosis not present

## 2019-03-12 DIAGNOSIS — N186 End stage renal disease: Secondary | ICD-10-CM | POA: Diagnosis not present

## 2019-03-12 DIAGNOSIS — E1129 Type 2 diabetes mellitus with other diabetic kidney complication: Secondary | ICD-10-CM | POA: Diagnosis not present

## 2019-03-12 DIAGNOSIS — Z539 Procedure and treatment not carried out, unspecified reason: Secondary | ICD-10-CM | POA: Insufficient documentation

## 2019-03-12 DIAGNOSIS — N2581 Secondary hyperparathyroidism of renal origin: Secondary | ICD-10-CM | POA: Diagnosis not present

## 2019-03-12 DIAGNOSIS — Z992 Dependence on renal dialysis: Secondary | ICD-10-CM | POA: Diagnosis not present

## 2019-03-14 DIAGNOSIS — Z539 Procedure and treatment not carried out, unspecified reason: Secondary | ICD-10-CM | POA: Diagnosis not present

## 2019-03-14 DIAGNOSIS — E1129 Type 2 diabetes mellitus with other diabetic kidney complication: Secondary | ICD-10-CM | POA: Diagnosis not present

## 2019-03-14 DIAGNOSIS — N2581 Secondary hyperparathyroidism of renal origin: Secondary | ICD-10-CM | POA: Diagnosis not present

## 2019-03-14 DIAGNOSIS — N186 End stage renal disease: Secondary | ICD-10-CM | POA: Diagnosis not present

## 2019-03-14 DIAGNOSIS — Z992 Dependence on renal dialysis: Secondary | ICD-10-CM | POA: Diagnosis not present

## 2019-03-16 DIAGNOSIS — Z539 Procedure and treatment not carried out, unspecified reason: Secondary | ICD-10-CM | POA: Diagnosis not present

## 2019-03-16 DIAGNOSIS — E1129 Type 2 diabetes mellitus with other diabetic kidney complication: Secondary | ICD-10-CM | POA: Diagnosis not present

## 2019-03-16 DIAGNOSIS — N2581 Secondary hyperparathyroidism of renal origin: Secondary | ICD-10-CM | POA: Diagnosis not present

## 2019-03-16 DIAGNOSIS — Z992 Dependence on renal dialysis: Secondary | ICD-10-CM | POA: Diagnosis not present

## 2019-03-16 DIAGNOSIS — N186 End stage renal disease: Secondary | ICD-10-CM | POA: Diagnosis not present

## 2019-03-17 ENCOUNTER — Ambulatory Visit: Payer: Medicare Other | Admitting: Podiatry

## 2019-03-18 DIAGNOSIS — N186 End stage renal disease: Secondary | ICD-10-CM | POA: Diagnosis not present

## 2019-03-18 DIAGNOSIS — Z539 Procedure and treatment not carried out, unspecified reason: Secondary | ICD-10-CM | POA: Diagnosis not present

## 2019-03-18 DIAGNOSIS — E1129 Type 2 diabetes mellitus with other diabetic kidney complication: Secondary | ICD-10-CM | POA: Diagnosis not present

## 2019-03-18 DIAGNOSIS — N2581 Secondary hyperparathyroidism of renal origin: Secondary | ICD-10-CM | POA: Diagnosis not present

## 2019-03-18 DIAGNOSIS — Z992 Dependence on renal dialysis: Secondary | ICD-10-CM | POA: Diagnosis not present

## 2019-03-21 DIAGNOSIS — N2581 Secondary hyperparathyroidism of renal origin: Secondary | ICD-10-CM | POA: Diagnosis not present

## 2019-03-21 DIAGNOSIS — Z539 Procedure and treatment not carried out, unspecified reason: Secondary | ICD-10-CM | POA: Diagnosis not present

## 2019-03-21 DIAGNOSIS — E1129 Type 2 diabetes mellitus with other diabetic kidney complication: Secondary | ICD-10-CM | POA: Diagnosis not present

## 2019-03-21 DIAGNOSIS — N186 End stage renal disease: Secondary | ICD-10-CM | POA: Diagnosis not present

## 2019-03-21 DIAGNOSIS — Z992 Dependence on renal dialysis: Secondary | ICD-10-CM | POA: Diagnosis not present

## 2019-03-22 DIAGNOSIS — Z7901 Long term (current) use of anticoagulants: Secondary | ICD-10-CM | POA: Diagnosis not present

## 2019-03-22 DIAGNOSIS — I4891 Unspecified atrial fibrillation: Secondary | ICD-10-CM | POA: Diagnosis not present

## 2019-03-23 ENCOUNTER — Encounter (HOSPITAL_COMMUNITY): Payer: Self-pay | Admitting: Internal Medicine

## 2019-03-23 DIAGNOSIS — Z992 Dependence on renal dialysis: Secondary | ICD-10-CM | POA: Diagnosis not present

## 2019-03-23 DIAGNOSIS — E1129 Type 2 diabetes mellitus with other diabetic kidney complication: Secondary | ICD-10-CM | POA: Diagnosis not present

## 2019-03-23 DIAGNOSIS — N186 End stage renal disease: Secondary | ICD-10-CM | POA: Diagnosis not present

## 2019-03-23 DIAGNOSIS — Z539 Procedure and treatment not carried out, unspecified reason: Secondary | ICD-10-CM | POA: Diagnosis not present

## 2019-03-23 DIAGNOSIS — N2581 Secondary hyperparathyroidism of renal origin: Secondary | ICD-10-CM | POA: Diagnosis not present

## 2019-03-25 DIAGNOSIS — Z539 Procedure and treatment not carried out, unspecified reason: Secondary | ICD-10-CM | POA: Diagnosis not present

## 2019-03-25 DIAGNOSIS — N186 End stage renal disease: Secondary | ICD-10-CM | POA: Diagnosis not present

## 2019-03-25 DIAGNOSIS — N2581 Secondary hyperparathyroidism of renal origin: Secondary | ICD-10-CM | POA: Diagnosis not present

## 2019-03-25 DIAGNOSIS — Z992 Dependence on renal dialysis: Secondary | ICD-10-CM | POA: Diagnosis not present

## 2019-03-25 DIAGNOSIS — E1129 Type 2 diabetes mellitus with other diabetic kidney complication: Secondary | ICD-10-CM | POA: Diagnosis not present

## 2019-03-28 DIAGNOSIS — Z539 Procedure and treatment not carried out, unspecified reason: Secondary | ICD-10-CM | POA: Diagnosis not present

## 2019-03-28 DIAGNOSIS — E1129 Type 2 diabetes mellitus with other diabetic kidney complication: Secondary | ICD-10-CM | POA: Diagnosis not present

## 2019-03-28 DIAGNOSIS — Z992 Dependence on renal dialysis: Secondary | ICD-10-CM | POA: Diagnosis not present

## 2019-03-28 DIAGNOSIS — N2581 Secondary hyperparathyroidism of renal origin: Secondary | ICD-10-CM | POA: Diagnosis not present

## 2019-03-28 DIAGNOSIS — N186 End stage renal disease: Secondary | ICD-10-CM | POA: Diagnosis not present

## 2019-03-30 DIAGNOSIS — Z992 Dependence on renal dialysis: Secondary | ICD-10-CM | POA: Diagnosis not present

## 2019-03-30 DIAGNOSIS — Z539 Procedure and treatment not carried out, unspecified reason: Secondary | ICD-10-CM | POA: Diagnosis not present

## 2019-03-30 DIAGNOSIS — E1129 Type 2 diabetes mellitus with other diabetic kidney complication: Secondary | ICD-10-CM | POA: Diagnosis not present

## 2019-03-30 DIAGNOSIS — N186 End stage renal disease: Secondary | ICD-10-CM | POA: Diagnosis not present

## 2019-03-30 DIAGNOSIS — N2581 Secondary hyperparathyroidism of renal origin: Secondary | ICD-10-CM | POA: Diagnosis not present

## 2019-04-01 DIAGNOSIS — N2581 Secondary hyperparathyroidism of renal origin: Secondary | ICD-10-CM | POA: Diagnosis not present

## 2019-04-01 DIAGNOSIS — Z992 Dependence on renal dialysis: Secondary | ICD-10-CM | POA: Diagnosis not present

## 2019-04-01 DIAGNOSIS — Z539 Procedure and treatment not carried out, unspecified reason: Secondary | ICD-10-CM | POA: Diagnosis not present

## 2019-04-01 DIAGNOSIS — N186 End stage renal disease: Secondary | ICD-10-CM | POA: Diagnosis not present

## 2019-04-01 DIAGNOSIS — E1129 Type 2 diabetes mellitus with other diabetic kidney complication: Secondary | ICD-10-CM | POA: Diagnosis not present

## 2019-04-02 DIAGNOSIS — Z992 Dependence on renal dialysis: Secondary | ICD-10-CM | POA: Diagnosis not present

## 2019-04-02 DIAGNOSIS — E1129 Type 2 diabetes mellitus with other diabetic kidney complication: Secondary | ICD-10-CM | POA: Diagnosis not present

## 2019-04-02 DIAGNOSIS — N186 End stage renal disease: Secondary | ICD-10-CM | POA: Diagnosis not present

## 2019-04-04 DIAGNOSIS — N186 End stage renal disease: Secondary | ICD-10-CM | POA: Diagnosis not present

## 2019-04-04 DIAGNOSIS — E1129 Type 2 diabetes mellitus with other diabetic kidney complication: Secondary | ICD-10-CM | POA: Diagnosis not present

## 2019-04-04 DIAGNOSIS — Z992 Dependence on renal dialysis: Secondary | ICD-10-CM | POA: Diagnosis not present

## 2019-04-04 DIAGNOSIS — N2581 Secondary hyperparathyroidism of renal origin: Secondary | ICD-10-CM | POA: Diagnosis not present

## 2019-04-05 DIAGNOSIS — I4891 Unspecified atrial fibrillation: Secondary | ICD-10-CM | POA: Diagnosis not present

## 2019-04-05 DIAGNOSIS — Z7901 Long term (current) use of anticoagulants: Secondary | ICD-10-CM | POA: Diagnosis not present

## 2019-04-06 DIAGNOSIS — N186 End stage renal disease: Secondary | ICD-10-CM | POA: Diagnosis not present

## 2019-04-06 DIAGNOSIS — E1129 Type 2 diabetes mellitus with other diabetic kidney complication: Secondary | ICD-10-CM | POA: Diagnosis not present

## 2019-04-06 DIAGNOSIS — N2581 Secondary hyperparathyroidism of renal origin: Secondary | ICD-10-CM | POA: Diagnosis not present

## 2019-04-06 DIAGNOSIS — Z992 Dependence on renal dialysis: Secondary | ICD-10-CM | POA: Diagnosis not present

## 2019-04-07 ENCOUNTER — Other Ambulatory Visit: Payer: Self-pay

## 2019-04-07 ENCOUNTER — Ambulatory Visit (HOSPITAL_COMMUNITY): Payer: Medicare Other | Attending: Cardiology

## 2019-04-07 DIAGNOSIS — I428 Other cardiomyopathies: Secondary | ICD-10-CM | POA: Diagnosis not present

## 2019-04-08 DIAGNOSIS — N2581 Secondary hyperparathyroidism of renal origin: Secondary | ICD-10-CM | POA: Diagnosis not present

## 2019-04-08 DIAGNOSIS — Z992 Dependence on renal dialysis: Secondary | ICD-10-CM | POA: Diagnosis not present

## 2019-04-08 DIAGNOSIS — E1129 Type 2 diabetes mellitus with other diabetic kidney complication: Secondary | ICD-10-CM | POA: Diagnosis not present

## 2019-04-08 DIAGNOSIS — N186 End stage renal disease: Secondary | ICD-10-CM | POA: Diagnosis not present

## 2019-04-11 DIAGNOSIS — Z992 Dependence on renal dialysis: Secondary | ICD-10-CM | POA: Diagnosis not present

## 2019-04-11 DIAGNOSIS — N2581 Secondary hyperparathyroidism of renal origin: Secondary | ICD-10-CM | POA: Diagnosis not present

## 2019-04-11 DIAGNOSIS — N186 End stage renal disease: Secondary | ICD-10-CM | POA: Diagnosis not present

## 2019-04-11 DIAGNOSIS — E1129 Type 2 diabetes mellitus with other diabetic kidney complication: Secondary | ICD-10-CM | POA: Diagnosis not present

## 2019-04-13 DIAGNOSIS — Z992 Dependence on renal dialysis: Secondary | ICD-10-CM | POA: Diagnosis not present

## 2019-04-13 DIAGNOSIS — N186 End stage renal disease: Secondary | ICD-10-CM | POA: Diagnosis not present

## 2019-04-13 DIAGNOSIS — E1129 Type 2 diabetes mellitus with other diabetic kidney complication: Secondary | ICD-10-CM | POA: Diagnosis not present

## 2019-04-13 DIAGNOSIS — N2581 Secondary hyperparathyroidism of renal origin: Secondary | ICD-10-CM | POA: Diagnosis not present

## 2019-04-15 DIAGNOSIS — N2581 Secondary hyperparathyroidism of renal origin: Secondary | ICD-10-CM | POA: Diagnosis not present

## 2019-04-15 DIAGNOSIS — Z992 Dependence on renal dialysis: Secondary | ICD-10-CM | POA: Diagnosis not present

## 2019-04-15 DIAGNOSIS — E1129 Type 2 diabetes mellitus with other diabetic kidney complication: Secondary | ICD-10-CM | POA: Diagnosis not present

## 2019-04-15 DIAGNOSIS — N186 End stage renal disease: Secondary | ICD-10-CM | POA: Diagnosis not present

## 2019-04-18 DIAGNOSIS — N2581 Secondary hyperparathyroidism of renal origin: Secondary | ICD-10-CM | POA: Diagnosis not present

## 2019-04-18 DIAGNOSIS — Z992 Dependence on renal dialysis: Secondary | ICD-10-CM | POA: Diagnosis not present

## 2019-04-18 DIAGNOSIS — E1129 Type 2 diabetes mellitus with other diabetic kidney complication: Secondary | ICD-10-CM | POA: Diagnosis not present

## 2019-04-18 DIAGNOSIS — N186 End stage renal disease: Secondary | ICD-10-CM | POA: Diagnosis not present

## 2019-04-20 DIAGNOSIS — E1129 Type 2 diabetes mellitus with other diabetic kidney complication: Secondary | ICD-10-CM | POA: Diagnosis not present

## 2019-04-20 DIAGNOSIS — N2581 Secondary hyperparathyroidism of renal origin: Secondary | ICD-10-CM | POA: Diagnosis not present

## 2019-04-20 DIAGNOSIS — N186 End stage renal disease: Secondary | ICD-10-CM | POA: Diagnosis not present

## 2019-04-20 DIAGNOSIS — Z992 Dependence on renal dialysis: Secondary | ICD-10-CM | POA: Diagnosis not present

## 2019-04-22 DIAGNOSIS — Z992 Dependence on renal dialysis: Secondary | ICD-10-CM | POA: Diagnosis not present

## 2019-04-22 DIAGNOSIS — E1129 Type 2 diabetes mellitus with other diabetic kidney complication: Secondary | ICD-10-CM | POA: Diagnosis not present

## 2019-04-22 DIAGNOSIS — N186 End stage renal disease: Secondary | ICD-10-CM | POA: Diagnosis not present

## 2019-04-22 DIAGNOSIS — N2581 Secondary hyperparathyroidism of renal origin: Secondary | ICD-10-CM | POA: Diagnosis not present

## 2019-04-24 DIAGNOSIS — N186 End stage renal disease: Secondary | ICD-10-CM | POA: Diagnosis not present

## 2019-04-24 DIAGNOSIS — E1129 Type 2 diabetes mellitus with other diabetic kidney complication: Secondary | ICD-10-CM | POA: Diagnosis not present

## 2019-04-24 DIAGNOSIS — Z992 Dependence on renal dialysis: Secondary | ICD-10-CM | POA: Diagnosis not present

## 2019-04-24 DIAGNOSIS — N2581 Secondary hyperparathyroidism of renal origin: Secondary | ICD-10-CM | POA: Diagnosis not present

## 2019-04-26 DIAGNOSIS — N2581 Secondary hyperparathyroidism of renal origin: Secondary | ICD-10-CM | POA: Diagnosis not present

## 2019-04-26 DIAGNOSIS — Z992 Dependence on renal dialysis: Secondary | ICD-10-CM | POA: Diagnosis not present

## 2019-04-26 DIAGNOSIS — E1129 Type 2 diabetes mellitus with other diabetic kidney complication: Secondary | ICD-10-CM | POA: Diagnosis not present

## 2019-04-26 DIAGNOSIS — N186 End stage renal disease: Secondary | ICD-10-CM | POA: Diagnosis not present

## 2019-04-29 DIAGNOSIS — Z992 Dependence on renal dialysis: Secondary | ICD-10-CM | POA: Diagnosis not present

## 2019-04-29 DIAGNOSIS — E1129 Type 2 diabetes mellitus with other diabetic kidney complication: Secondary | ICD-10-CM | POA: Diagnosis not present

## 2019-04-29 DIAGNOSIS — N2581 Secondary hyperparathyroidism of renal origin: Secondary | ICD-10-CM | POA: Diagnosis not present

## 2019-04-29 DIAGNOSIS — N186 End stage renal disease: Secondary | ICD-10-CM | POA: Diagnosis not present

## 2019-05-04 ENCOUNTER — Telehealth: Payer: Medicare Other | Admitting: Internal Medicine

## 2019-05-10 DIAGNOSIS — I4891 Unspecified atrial fibrillation: Secondary | ICD-10-CM | POA: Diagnosis not present

## 2019-05-10 DIAGNOSIS — Z7901 Long term (current) use of anticoagulants: Secondary | ICD-10-CM | POA: Diagnosis not present

## 2019-05-19 ENCOUNTER — Encounter: Payer: Self-pay | Admitting: Internal Medicine

## 2019-05-19 ENCOUNTER — Telehealth (INDEPENDENT_AMBULATORY_CARE_PROVIDER_SITE_OTHER): Payer: Medicare Other | Admitting: Internal Medicine

## 2019-05-19 VITALS — BP 135/65 | HR 98 | Wt 245.0 lb

## 2019-05-19 DIAGNOSIS — I428 Other cardiomyopathies: Secondary | ICD-10-CM

## 2019-05-19 DIAGNOSIS — N186 End stage renal disease: Secondary | ICD-10-CM

## 2019-05-19 DIAGNOSIS — Z992 Dependence on renal dialysis: Secondary | ICD-10-CM

## 2019-05-19 DIAGNOSIS — I480 Paroxysmal atrial fibrillation: Secondary | ICD-10-CM | POA: Diagnosis not present

## 2019-05-19 NOTE — Progress Notes (Signed)
Virtual Visit via Telephone Note   This visit type was conducted due to national recommendations for restrictions regarding the COVID-19 Pandemic (e.g. social distancing) in an effort to limit this patient's exposure and mitigate transmission in our community.  Due to his co-morbid illnesses, this patient is at least at moderate risk for complications without adequate follow up.  This format is felt to be most appropriate for this patient at this time.  The patient did not have access to video technology/had technical difficulties with video requiring transitioning to audio format only (telephone).  All issues noted in this document were discussed and addressed.  No physical exam could be performed with this format.  Please refer to the patient's chart for his  consent to telehealth for [facility].   Evaluation Performed:  Telephone visit  Date:  05/19/2019   ID:  [name], [name] [birth year], MRN [id number]  Patient Location:  [address] [place] [address]  Provider location:   [address], [place] [place], [place] [address]  PCP:  Burnard Bunting, MD  Cardiologist:  Pixie Casino, MD Electrophysiologist:  Will [name], MD   Chief Complaint:  Follow-up  History of Present Illness:    [name] is a 68 y.o. male who presents via audio/video conferencing for a telehealth visit today.  [name] is a 68 y.o. male with a past medial history significant for end-stage renal disease on dialysis, diabetic nephropathy and retinopathy, history of DVT, and recent admission for diffuse abdominal pain and hypotension, subsequently found to have a large pericardial effusion.  Was also treated for presumptive septic shock on broad-spectrum antibiotics and required long-term intubation and ICU stay.  Based on his pericardial effusion and signs and symptoms of tamponade physiology, he was taken urgently to the operating room and underwent pericardial window.  This  demonstrated a large, hemorrhagic pericardial effusion with tamponade.  Subsequently he had dramatic improvements in his hemodynamics and ultimately was able to be weaned off of pressors and extubated.  He also struggled with atrial fibrillation.  This is a long-standing issue and he was seen in the past by Dr. Curt Bears in 2018.  [name] returns today in follow-up.  Remarkably seems to be doing well.  He underwent a long stent of rehabilitation but is able to walk now, albeit slowly with a walker.  He was seen in follow-up by Doreene Adas, PA-C, who did not recommend restarting anticoagulation due to his hemorrhagic pericardial effusion.  Although, he has had pericardial window therefore he is risk of recurrent tamponade is low.  He is in persistent atrial flutter today, which would be a greater risk factor for stroke.  Finally he is struggled with hypotension on dialysis days.  He has been taking Midrin 5 mg 3 times daily every day, not just on dialysis days.  Today is a nondialysis day and his blood pressure was elevated 179/88.  09/15/2018  [name] was last seen in follow-up by Dr. Curt Bears with cardiac EP on 07/25/2018.  He noted that his INR is on Coumadin and is followed by his PCP.  [name] had an INR checked yesterday which was 2.1.  He does have a history of hemorrhagic pericardial effusion and is status post pericardial window as of August 2019.  Blood pressure appears to be low at times and he uses midodrine on dialysis days.  Fortunately, he had no coronary artery disease by catheterization.  He currently denies any  chest pain or worsening shortness of breath.  Weight has been stable and he denies any worsening swelling or weight gain.  He is still interested in pursuing kidney transplant.  He did have a visit in July 2019 at [facility] and they are inquiring about his medical records.  I will copy this letter to the transplant coordinator.  05/19/2019  [name] was seen today in follow-up.   Overall he is done very well.  He is echo last month showed marked improvement in LVEF from 30 to 35% up to 50 to 55%.  Overall he is feeling better.  He was turned down for renal transplant at both [place] due to cardiomyopathy, however since he has had improvement and showed no significant obstructive coronary disease on cath, I suspect that he would now be a candidate for transplant.  The patient does not have symptoms concerning for COVID-19 infection (fever, chills, cough, or new SHORTNESS OF BREATH).    Prior CV studies:   The following studies were reviewed today:  Records Office visits Labs  PMHx:  Past Medical History:  Diagnosis Date  . Anemia   . Arthritis    HNP- lumbar, "all over my body"  . Blood transfusion    "years ago; blood was low" (08/05/2013)  . CKD (chronic kidney disease) stage 4, GFR 15-29 ml/min (HCC) 03/18/2012   [place]- T,TH,Sat.  . Diabetic nephropathy ([place])   . Diabetic retinopathy   . DVT (deep venous thrombosis) ([place])    "got one in my right leg now; I've had one before too, not sure which leg" (08/05/2013)  . ESRD (end stage renal disease) on dialysis [facility])    "just started today, (08/04/2013)"  . Family history of anesthesia complication    " my son wakes up slowly"  . GERD (gastroesophageal reflux disease)    uses alka seltzere on occas.   [name])    "one q now and then" (08/05/2013)  . Hyperlipidemia   . Hypertension   . 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 Non Hodgkin's lymphoma ([place])    Tx 2009; "had chemo; it went away" (08/05/2013)  . Noncompliance 03/16/2012  . NSVT (nonsustained ventricular tachycardia) ([place]) 03/18/2012  . Peripheral vascular disease ([place])   . Pneumonia 2013   hosp.-   . Poor historian    pt. unsure of several answers to health history questions   . Skin cancer    melanoma - head  . Sleep apnea    "suppose  to have a sleep study, but they never told me when. (08/05/2013)    Past Surgical History:  Procedure Laterality Date  . ACHILLES TENDON SURGERY Right 03/13/2016   Procedure: ACHILLES LENGTHENING/KIDNER;  Surgeon: Edrick Kins, DPM;  Location: [place];  Service: Podiatry;  Laterality: Right;  . AV FISTULA PLACEMENT Left 02/03/2013   Procedure: ARTERIOVENOUS (AV) FISTULA CREATION- LEFT RADIAL CEPHALIC; ULTRASOUND GUIDED;  Surgeon: Mal Misty, MD;  Location: [facility] OR;  Service: Vascular;  Laterality: Left;  . AV FISTULA PLACEMENT Right 11/08/2015   Procedure: RIGHT BRACHIOCEPHALIC ARTERIOVENOUS (AV) FISTULA CREATION;  Surgeon: Serafina Mitchell, MD;  Location: [place];  Service: Vascular;  Laterality: Right;  . BASCILIC VEIN TRANSPOSITION Right 01/24/2016   Procedure: RIGHT SECOND STAGE BASILIC VEIN TRANSPOSITION;  Surgeon: Angelia Mould, MD;  Location: [place];  Service: Vascular;  Laterality: Right;  . CARDIAC CATHETERIZATION    .  COLONOSCOPY N/A 09/23/2015   Procedure: COLONOSCOPY;  Surgeon: Irene Shipper, MD;  Location: [facility];  Service: Endoscopy;  Laterality: N/A;  . Coloscopy    . EYE SURGERY Bilateral   . GAS INSERTION  05/10/2012   Procedure: INSERTION OF GAS;  Surgeon: Hayden Pedro, MD;  Location: [place];  Service: Ophthalmology;  Laterality: Right;  . LEFT HEART CATH AND CORONARY ANGIOGRAPHY N/A 09/11/2016   Procedure: Left Heart Cath and Coronary Angiography;  Surgeon: Belva Crome, MD;  Location: [place] CV LAB;  Service: Cardiovascular;  Laterality: N/A;  . LESION EXCISION Right 05/08/2015   Procedure: EXCISION SCALP LESION;  Surgeon: Erroll Luna, MD;  Location: [place];  Service: General;  Laterality: Right;  . MEMBRANE PEEL  05/10/2012   Procedure: MEMBRANE PEEL;  Surgeon: Hayden Pedro, MD;  Location: [place];  Service: Ophthalmology;  Laterality: Right;  . PARS PLANA VITRECTOMY  08/27/2011   Procedure: PARS PLANA VITRECTOMY WITH 25 GAUGE;  Surgeon: Hayden Pedro, MD;   Location: [place];  Service: Ophthalmology;  Laterality: Left;  Repair of complex traction retinal detachment left eye  . PARS PLANA VITRECTOMY  05/10/2012   Procedure: PARS PLANA VITRECTOMY WITH 25 GAUGE;  Surgeon: Hayden Pedro, MD;  Location: [place];  Service: Ophthalmology;  Laterality: Right;  Repair Complex Traction Retinal Detachment  . PHOTOCOAGULATION WITH LASER  05/10/2012   Procedure: PHOTOCOAGULATION WITH LASER;  Surgeon: Hayden Pedro, MD;  Location: [place];  Service: Ophthalmology;  Laterality: Right;  . PORT-A-CATH REMOVAL    . PORTACATH PLACEMENT    . SUBXYPHOID PERICARDIAL WINDOW N/A 01/10/2018   Procedure: SUBXYPHOID PERICARDIAL WINDOW;  Surgeon: Rexene Alberts, MD;  Location: [place];  Service: Thoracic;  Laterality: N/A;  . TRANSMETATARSAL AMPUTATION Right 03/13/2016   Procedure: TRANSMETATARSAL AMPUTATION;  Surgeon: Edrick Kins, DPM;  Location: [place];  Service: Podiatry;  Laterality: Right;  . VENA CAVA FILTER PLACEMENT  09/2012   due to preparation for surgery    FAMHx:  Family History  Problem Relation Age of Onset  . Anesthesia problems Son   . Hypertension Son   . Diabetes Father   . Hypertension Father   . Other Father        amputation  . Colon cancer Neg Hx     SOCHx:   reports that he has never smoked. He has never used smokeless tobacco. He reports that he does not drink alcohol or use drugs.  ALLERGIES:  No Known Allergies  MEDS:  Current Meds  Medication Sig  . acetaminophen (TYLENOL) 325 MG tablet Take 650 mg by mouth every 6 (six) hours as needed (pain).  . B Complex-C-Folic Acid (RENA-VITE PO) Take by mouth.  . Cholecalciferol (VITAMIN D3) 50000 units CAPS Take 1 capsule by mouth daily.  . insulin aspart (NOVOLOG) 100 UNIT/ML injection Inject 1-9 Units into the skin 3 (three) times daily before meals.  . insulin glargine (LANTUS) 100 UNIT/ML injection Inject 0.15 mLs (15 Units total) into the skin at bedtime.  . lidocaine-prilocaine (EMLA)  cream Apply 1 application topically as needed (Apply small amount to access site 1-2 hours before dialysis. Cover with occlusive dressing (saran wrap)).   . midodrine (PROAMATINE) 5 MG tablet Take 5 mg by mouth 3 (three) times daily with meals. Take ONLY on DIALYSIS DAYS  . MULTIPLE VITAMINS-MINERALS PO Take by mouth. 1 tablet PO daily  . oseltamivir (TAMIFLU) 30 MG capsule TAKE 1 CAPSULE BY MOUTH TODAY  THEN AFTER DIALYSIS ON SAT TUES THUR SAT TUES  . polysaccharide iron (NIFEREX) 150 MG CAPS capsule Take 1 capsule (150 mg total) by mouth daily.  . sevelamer carbonate (RENVELA) 800 MG tablet Take 1,600 mg by mouth 3 (three) times daily with meals.  . Skin Protectants, Misc. (MINERIN) CREA Apply topically. Topically every 12 hours  . traMADol (ULTRAM) 50 MG tablet Take 1 tablet (50 mg total) by mouth every 6 (six) hours as needed.  . vitamin C (ASCORBIC ACID) 500 MG tablet Take 500 mg by mouth 2 (two) times daily.  Marland Kitchen warfarin (COUMADIN) 5 MG tablet TAKE 1 TABLET BY MOUTH ON TUES WEDS & FRI. TAKE 1 & 1 2 TABLETS MON THURS SAT & SUN  . zinc sulfate 220 (50 Zn) MG capsule Take 220 mg by mouth daily.     ROS: Pertinent items noted in HPI and remainder of comprehensive ROS otherwise negative.  Labs/Other Tests and Data Reviewed:    Recent Labs: 07/16/2018: BUN 17; Creatinine, Ser 4.22; Hemoglobin 10.7; Platelets 132; Potassium 3.8; Sodium 134   Recent Lipid Panel Lab Results  Component Value Date/Time   CHOL 129 01/14/2011 04:37 AM   TRIG 137 01/14/2011 04:37 AM   HDL 13 (L) 01/14/2011 04:37 AM   CHOLHDL 9.9 01/14/2011 04:37 AM   LDLCALC 89 01/14/2011 04:37 AM    Wt Readings from Last 3 Encounters:  05/19/19 245 lb (111.1 kg)  09/21/18 245 lb (111.1 kg)  09/15/18 245 lb (111.1 kg)     Exam:    Vital Signs:  BP 135/65   Pulse 98   Wt 245 lb (111.1 kg)   BMI 30.62 kg/m    No exam due to telephone visit  ASSESSMENT & PLAN:    1. Nonischemic cardiomyopathy-LVEF 30 to 35%,  improved to 50 to 55% (04/2019) 2. PAF/flutter 3. Recent tamponade with hemorrhagic effusion-status post pericardial window (12/2017) 4. No significant obstructive coronary disease by cath (12/2017) 5. End-stage renal disease on hemodialysis -attempting to obtain kidney transplant 6. Insulin-dependent diabetes with endorgan effects 7. Hypertension 8. Dyslipidemia  [name] has had significant improvement in LVEF up to 50 to 55%.  This should qualify him for renal transplant and I would encourage his nephrologist to help him work to see if he could pursue that as he is very interested.  He also had no significant coronary disease by cath.  Overall from a cardiac standpoint I think he is a good candidate for transplant.  Plan follow-up with me in 6 months.  COVID-19 Education: The signs and symptoms of COVID-19 were discussed with the patient and how to seek care for testing (follow up with PCP or arrange E-visit).  The importance of social distancing was discussed today.  Patient Risk:   After full review of this patients clinical status, I feel that they are at least moderate risk at this time.  Time:   Today, I have spent 15 minutes with the patient with telehealth technology discussing A. fib flutter, pericardial effusion with tamponade, anticoagulation, diabetes, hypertension, end-stage renal disease, and transplant work-up.     Medication Adjustments/Labs and Tests Ordered: Current medicines are reviewed at length with the patient today.  Concerns regarding medicines are outlined above.   Tests Ordered: No orders of the defined types were placed in this encounter.   Medication Changes: No orders of the defined types were placed in this encounter.   Disposition:  in 6 month(s)  Pixie Casino, MD, [facility], [place]  [place] Director of the Advanced Lipid Disorders &  Cardiovascular Risk Reduction Clinic Diplomate of the American Board of Clinical  Lipidology Attending Cardiologist  Direct Dial: [phone]  Fax: [phone]  Website:  [email or web address]  Pixie Casino, MD  05/19/2019 8:13 AM

## 2019-05-24 ENCOUNTER — Encounter: Payer: Self-pay | Admitting: Podiatry

## 2019-05-24 ENCOUNTER — Ambulatory Visit (INDEPENDENT_AMBULATORY_CARE_PROVIDER_SITE_OTHER): Payer: Medicare Other | Admitting: Podiatry

## 2019-05-24 ENCOUNTER — Other Ambulatory Visit: Payer: Self-pay

## 2019-05-24 DIAGNOSIS — E0842 Diabetes mellitus due to underlying condition with diabetic polyneuropathy: Secondary | ICD-10-CM

## 2019-05-24 DIAGNOSIS — I739 Peripheral vascular disease, unspecified: Secondary | ICD-10-CM | POA: Diagnosis not present

## 2019-05-24 DIAGNOSIS — M79676 Pain in unspecified toe(s): Secondary | ICD-10-CM

## 2019-05-24 DIAGNOSIS — B351 Tinea unguium: Secondary | ICD-10-CM

## 2019-05-24 DIAGNOSIS — Z89431 Acquired absence of right foot: Secondary | ICD-10-CM

## 2019-05-24 NOTE — Progress Notes (Signed)
Complaint:  Visit Type: Patient returns to my office for continued preventative foot care services. Complaint: Patient states" my nails have grown long and thick and become painful to walk and wear shoes on his left foot.  Patient has been diagnosed with DM with angiopathy.  Patient has had transmetatarsal amputation performed toes right foot.. The patient presents for preventative foot care services. No changes to ROS  Podiatric Exam: Vascular: dorsalis pedis and posterior tibial pulses are not  palpable left.  ... Capillary return is immediate. Temperature gradient is WNL. Skin turgor WNL  Significant swelling feet  Left  Sensorium: Normal Semmes Weinstein monofilament test. Normal tactile sensation left foot. Nail Exam: Pt has thick disfigured discolored nails with subungual debris noted  entire nail hallux through fifth toenails left foot. Ulcer Exam: There is no evidence of ulcer or pre-ulcerative changes or infection. Orthopedic Exam: Muscle tone and strength are WNL. No limitations in general ROM. No crepitus or effusions noted. Foot type and digits show no abnormalities. Bony prominences are unremarkable. TMA right foot. Skin: No Porokeratosis. No infection or ulcers  Diagnosis:  Onychomycosis, ,, pain in left toes,  TMA right foot.  Treatment & Plan Procedures and Treatment: Consent by patient was obtained for treatment procedures.   Debridement of mycotic and hypertrophic toenails, 1 through 5 left foot  and clearing of subungual debris. No ulceration, no infection noted.  Return Visit-Office Procedure: Patient instructed to return to the office for a follow up visit 3 months for continued evaluation and treatment.    Gardiner Barefoot DPM

## 2019-06-02 DIAGNOSIS — N186 End stage renal disease: Secondary | ICD-10-CM | POA: Diagnosis not present

## 2019-06-02 DIAGNOSIS — E1129 Type 2 diabetes mellitus with other diabetic kidney complication: Secondary | ICD-10-CM | POA: Diagnosis not present

## 2019-06-02 DIAGNOSIS — Z992 Dependence on renal dialysis: Secondary | ICD-10-CM | POA: Diagnosis not present

## 2019-06-04 DIAGNOSIS — D509 Iron deficiency anemia, unspecified: Secondary | ICD-10-CM | POA: Diagnosis not present

## 2019-06-04 DIAGNOSIS — Z992 Dependence on renal dialysis: Secondary | ICD-10-CM | POA: Diagnosis not present

## 2019-06-04 DIAGNOSIS — E1129 Type 2 diabetes mellitus with other diabetic kidney complication: Secondary | ICD-10-CM | POA: Diagnosis not present

## 2019-06-04 DIAGNOSIS — N186 End stage renal disease: Secondary | ICD-10-CM | POA: Diagnosis not present

## 2019-06-04 DIAGNOSIS — N2581 Secondary hyperparathyroidism of renal origin: Secondary | ICD-10-CM | POA: Diagnosis not present

## 2019-06-06 DIAGNOSIS — D509 Iron deficiency anemia, unspecified: Secondary | ICD-10-CM | POA: Diagnosis not present

## 2019-06-06 DIAGNOSIS — N2581 Secondary hyperparathyroidism of renal origin: Secondary | ICD-10-CM | POA: Diagnosis not present

## 2019-06-06 DIAGNOSIS — Z992 Dependence on renal dialysis: Secondary | ICD-10-CM | POA: Diagnosis not present

## 2019-06-06 DIAGNOSIS — N186 End stage renal disease: Secondary | ICD-10-CM | POA: Diagnosis not present

## 2019-06-06 DIAGNOSIS — E1129 Type 2 diabetes mellitus with other diabetic kidney complication: Secondary | ICD-10-CM | POA: Diagnosis not present

## 2019-06-08 DIAGNOSIS — D509 Iron deficiency anemia, unspecified: Secondary | ICD-10-CM | POA: Diagnosis not present

## 2019-06-08 DIAGNOSIS — Z992 Dependence on renal dialysis: Secondary | ICD-10-CM | POA: Diagnosis not present

## 2019-06-08 DIAGNOSIS — N2581 Secondary hyperparathyroidism of renal origin: Secondary | ICD-10-CM | POA: Diagnosis not present

## 2019-06-08 DIAGNOSIS — N186 End stage renal disease: Secondary | ICD-10-CM | POA: Diagnosis not present

## 2019-06-08 DIAGNOSIS — E1129 Type 2 diabetes mellitus with other diabetic kidney complication: Secondary | ICD-10-CM | POA: Diagnosis not present

## 2019-06-10 DIAGNOSIS — N186 End stage renal disease: Secondary | ICD-10-CM | POA: Diagnosis not present

## 2019-06-10 DIAGNOSIS — D509 Iron deficiency anemia, unspecified: Secondary | ICD-10-CM | POA: Diagnosis not present

## 2019-06-10 DIAGNOSIS — E1129 Type 2 diabetes mellitus with other diabetic kidney complication: Secondary | ICD-10-CM | POA: Diagnosis not present

## 2019-06-10 DIAGNOSIS — N2581 Secondary hyperparathyroidism of renal origin: Secondary | ICD-10-CM | POA: Diagnosis not present

## 2019-06-10 DIAGNOSIS — Z992 Dependence on renal dialysis: Secondary | ICD-10-CM | POA: Diagnosis not present

## 2019-06-13 DIAGNOSIS — D509 Iron deficiency anemia, unspecified: Secondary | ICD-10-CM | POA: Diagnosis not present

## 2019-06-13 DIAGNOSIS — N2581 Secondary hyperparathyroidism of renal origin: Secondary | ICD-10-CM | POA: Diagnosis not present

## 2019-06-13 DIAGNOSIS — N186 End stage renal disease: Secondary | ICD-10-CM | POA: Diagnosis not present

## 2019-06-13 DIAGNOSIS — E1129 Type 2 diabetes mellitus with other diabetic kidney complication: Secondary | ICD-10-CM | POA: Diagnosis not present

## 2019-06-13 DIAGNOSIS — Z992 Dependence on renal dialysis: Secondary | ICD-10-CM | POA: Diagnosis not present

## 2019-06-15 DIAGNOSIS — Z992 Dependence on renal dialysis: Secondary | ICD-10-CM | POA: Diagnosis not present

## 2019-06-15 DIAGNOSIS — N2581 Secondary hyperparathyroidism of renal origin: Secondary | ICD-10-CM | POA: Diagnosis not present

## 2019-06-15 DIAGNOSIS — E1129 Type 2 diabetes mellitus with other diabetic kidney complication: Secondary | ICD-10-CM | POA: Diagnosis not present

## 2019-06-15 DIAGNOSIS — N186 End stage renal disease: Secondary | ICD-10-CM | POA: Diagnosis not present

## 2019-06-15 DIAGNOSIS — D509 Iron deficiency anemia, unspecified: Secondary | ICD-10-CM | POA: Diagnosis not present

## 2019-06-17 DIAGNOSIS — Z992 Dependence on renal dialysis: Secondary | ICD-10-CM | POA: Diagnosis not present

## 2019-06-17 DIAGNOSIS — D509 Iron deficiency anemia, unspecified: Secondary | ICD-10-CM | POA: Diagnosis not present

## 2019-06-17 DIAGNOSIS — N2581 Secondary hyperparathyroidism of renal origin: Secondary | ICD-10-CM | POA: Diagnosis not present

## 2019-06-17 DIAGNOSIS — N186 End stage renal disease: Secondary | ICD-10-CM | POA: Diagnosis not present

## 2019-06-17 DIAGNOSIS — E1129 Type 2 diabetes mellitus with other diabetic kidney complication: Secondary | ICD-10-CM | POA: Diagnosis not present

## 2019-06-20 DIAGNOSIS — Z992 Dependence on renal dialysis: Secondary | ICD-10-CM | POA: Diagnosis not present

## 2019-06-20 DIAGNOSIS — E1129 Type 2 diabetes mellitus with other diabetic kidney complication: Secondary | ICD-10-CM | POA: Diagnosis not present

## 2019-06-20 DIAGNOSIS — D509 Iron deficiency anemia, unspecified: Secondary | ICD-10-CM | POA: Diagnosis not present

## 2019-06-20 DIAGNOSIS — N186 End stage renal disease: Secondary | ICD-10-CM | POA: Diagnosis not present

## 2019-06-20 DIAGNOSIS — N2581 Secondary hyperparathyroidism of renal origin: Secondary | ICD-10-CM | POA: Diagnosis not present

## 2019-06-22 DIAGNOSIS — D509 Iron deficiency anemia, unspecified: Secondary | ICD-10-CM | POA: Diagnosis not present

## 2019-06-22 DIAGNOSIS — N2581 Secondary hyperparathyroidism of renal origin: Secondary | ICD-10-CM | POA: Diagnosis not present

## 2019-06-22 DIAGNOSIS — N186 End stage renal disease: Secondary | ICD-10-CM | POA: Diagnosis not present

## 2019-06-22 DIAGNOSIS — Z992 Dependence on renal dialysis: Secondary | ICD-10-CM | POA: Diagnosis not present

## 2019-06-22 DIAGNOSIS — E1129 Type 2 diabetes mellitus with other diabetic kidney complication: Secondary | ICD-10-CM | POA: Diagnosis not present

## 2019-06-24 DIAGNOSIS — Z992 Dependence on renal dialysis: Secondary | ICD-10-CM | POA: Diagnosis not present

## 2019-06-24 DIAGNOSIS — E1129 Type 2 diabetes mellitus with other diabetic kidney complication: Secondary | ICD-10-CM | POA: Diagnosis not present

## 2019-06-24 DIAGNOSIS — N186 End stage renal disease: Secondary | ICD-10-CM | POA: Diagnosis not present

## 2019-06-24 DIAGNOSIS — D509 Iron deficiency anemia, unspecified: Secondary | ICD-10-CM | POA: Diagnosis not present

## 2019-06-24 DIAGNOSIS — N2581 Secondary hyperparathyroidism of renal origin: Secondary | ICD-10-CM | POA: Diagnosis not present

## 2019-06-27 DIAGNOSIS — E1129 Type 2 diabetes mellitus with other diabetic kidney complication: Secondary | ICD-10-CM | POA: Diagnosis not present

## 2019-06-27 DIAGNOSIS — D509 Iron deficiency anemia, unspecified: Secondary | ICD-10-CM | POA: Diagnosis not present

## 2019-06-27 DIAGNOSIS — N2581 Secondary hyperparathyroidism of renal origin: Secondary | ICD-10-CM | POA: Diagnosis not present

## 2019-06-27 DIAGNOSIS — N186 End stage renal disease: Secondary | ICD-10-CM | POA: Diagnosis not present

## 2019-06-27 DIAGNOSIS — Z992 Dependence on renal dialysis: Secondary | ICD-10-CM | POA: Diagnosis not present

## 2019-06-28 DIAGNOSIS — Z7901 Long term (current) use of anticoagulants: Secondary | ICD-10-CM | POA: Diagnosis not present

## 2019-06-28 DIAGNOSIS — I4891 Unspecified atrial fibrillation: Secondary | ICD-10-CM | POA: Diagnosis not present

## 2019-06-29 DIAGNOSIS — N2581 Secondary hyperparathyroidism of renal origin: Secondary | ICD-10-CM | POA: Diagnosis not present

## 2019-06-29 DIAGNOSIS — Z992 Dependence on renal dialysis: Secondary | ICD-10-CM | POA: Diagnosis not present

## 2019-06-29 DIAGNOSIS — D509 Iron deficiency anemia, unspecified: Secondary | ICD-10-CM | POA: Diagnosis not present

## 2019-06-29 DIAGNOSIS — E1129 Type 2 diabetes mellitus with other diabetic kidney complication: Secondary | ICD-10-CM | POA: Diagnosis not present

## 2019-06-29 DIAGNOSIS — N186 End stage renal disease: Secondary | ICD-10-CM | POA: Diagnosis not present

## 2019-07-01 DIAGNOSIS — E1129 Type 2 diabetes mellitus with other diabetic kidney complication: Secondary | ICD-10-CM | POA: Diagnosis not present

## 2019-07-01 DIAGNOSIS — Z992 Dependence on renal dialysis: Secondary | ICD-10-CM | POA: Diagnosis not present

## 2019-07-01 DIAGNOSIS — N2581 Secondary hyperparathyroidism of renal origin: Secondary | ICD-10-CM | POA: Diagnosis not present

## 2019-07-01 DIAGNOSIS — N186 End stage renal disease: Secondary | ICD-10-CM | POA: Diagnosis not present

## 2019-07-01 DIAGNOSIS — D509 Iron deficiency anemia, unspecified: Secondary | ICD-10-CM | POA: Diagnosis not present

## 2019-07-03 DIAGNOSIS — Z992 Dependence on renal dialysis: Secondary | ICD-10-CM | POA: Diagnosis not present

## 2019-07-03 DIAGNOSIS — N186 End stage renal disease: Secondary | ICD-10-CM | POA: Diagnosis not present

## 2019-07-03 DIAGNOSIS — E1129 Type 2 diabetes mellitus with other diabetic kidney complication: Secondary | ICD-10-CM | POA: Diagnosis not present

## 2019-07-04 DIAGNOSIS — N2581 Secondary hyperparathyroidism of renal origin: Secondary | ICD-10-CM | POA: Diagnosis not present

## 2019-07-04 DIAGNOSIS — E1129 Type 2 diabetes mellitus with other diabetic kidney complication: Secondary | ICD-10-CM | POA: Diagnosis not present

## 2019-07-04 DIAGNOSIS — Z23 Encounter for immunization: Secondary | ICD-10-CM | POA: Diagnosis not present

## 2019-07-04 DIAGNOSIS — Z992 Dependence on renal dialysis: Secondary | ICD-10-CM | POA: Diagnosis not present

## 2019-07-04 DIAGNOSIS — N186 End stage renal disease: Secondary | ICD-10-CM | POA: Diagnosis not present

## 2019-07-05 ENCOUNTER — Encounter (INDEPENDENT_AMBULATORY_CARE_PROVIDER_SITE_OTHER): Payer: Medicare Other | Admitting: Ophthalmology

## 2019-07-05 DIAGNOSIS — E11319 Type 2 diabetes mellitus with unspecified diabetic retinopathy without macular edema: Secondary | ICD-10-CM

## 2019-07-05 DIAGNOSIS — I10 Essential (primary) hypertension: Secondary | ICD-10-CM

## 2019-07-05 DIAGNOSIS — H35033 Hypertensive retinopathy, bilateral: Secondary | ICD-10-CM | POA: Diagnosis not present

## 2019-07-05 DIAGNOSIS — E113593 Type 2 diabetes mellitus with proliferative diabetic retinopathy without macular edema, bilateral: Secondary | ICD-10-CM

## 2019-07-06 DIAGNOSIS — Z992 Dependence on renal dialysis: Secondary | ICD-10-CM | POA: Diagnosis not present

## 2019-07-06 DIAGNOSIS — N186 End stage renal disease: Secondary | ICD-10-CM | POA: Diagnosis not present

## 2019-07-06 DIAGNOSIS — N2581 Secondary hyperparathyroidism of renal origin: Secondary | ICD-10-CM | POA: Diagnosis not present

## 2019-07-06 DIAGNOSIS — Z23 Encounter for immunization: Secondary | ICD-10-CM | POA: Diagnosis not present

## 2019-07-06 DIAGNOSIS — E1129 Type 2 diabetes mellitus with other diabetic kidney complication: Secondary | ICD-10-CM | POA: Diagnosis not present

## 2019-07-08 DIAGNOSIS — N2581 Secondary hyperparathyroidism of renal origin: Secondary | ICD-10-CM | POA: Diagnosis not present

## 2019-07-08 DIAGNOSIS — Z23 Encounter for immunization: Secondary | ICD-10-CM | POA: Diagnosis not present

## 2019-07-08 DIAGNOSIS — N186 End stage renal disease: Secondary | ICD-10-CM | POA: Diagnosis not present

## 2019-07-08 DIAGNOSIS — Z992 Dependence on renal dialysis: Secondary | ICD-10-CM | POA: Diagnosis not present

## 2019-07-08 DIAGNOSIS — E1129 Type 2 diabetes mellitus with other diabetic kidney complication: Secondary | ICD-10-CM | POA: Diagnosis not present

## 2019-07-11 DIAGNOSIS — N186 End stage renal disease: Secondary | ICD-10-CM | POA: Diagnosis not present

## 2019-07-11 DIAGNOSIS — E1129 Type 2 diabetes mellitus with other diabetic kidney complication: Secondary | ICD-10-CM | POA: Diagnosis not present

## 2019-07-11 DIAGNOSIS — Z992 Dependence on renal dialysis: Secondary | ICD-10-CM | POA: Diagnosis not present

## 2019-07-11 DIAGNOSIS — Z23 Encounter for immunization: Secondary | ICD-10-CM | POA: Diagnosis not present

## 2019-07-11 DIAGNOSIS — N2581 Secondary hyperparathyroidism of renal origin: Secondary | ICD-10-CM | POA: Diagnosis not present

## 2019-07-13 DIAGNOSIS — Z992 Dependence on renal dialysis: Secondary | ICD-10-CM | POA: Diagnosis not present

## 2019-07-13 DIAGNOSIS — N186 End stage renal disease: Secondary | ICD-10-CM | POA: Diagnosis not present

## 2019-07-13 DIAGNOSIS — E1129 Type 2 diabetes mellitus with other diabetic kidney complication: Secondary | ICD-10-CM | POA: Diagnosis not present

## 2019-07-13 DIAGNOSIS — Z23 Encounter for immunization: Secondary | ICD-10-CM | POA: Diagnosis not present

## 2019-07-13 DIAGNOSIS — N2581 Secondary hyperparathyroidism of renal origin: Secondary | ICD-10-CM | POA: Diagnosis not present

## 2019-07-15 DIAGNOSIS — E1129 Type 2 diabetes mellitus with other diabetic kidney complication: Secondary | ICD-10-CM | POA: Diagnosis not present

## 2019-07-15 DIAGNOSIS — N2581 Secondary hyperparathyroidism of renal origin: Secondary | ICD-10-CM | POA: Diagnosis not present

## 2019-07-15 DIAGNOSIS — Z992 Dependence on renal dialysis: Secondary | ICD-10-CM | POA: Diagnosis not present

## 2019-07-15 DIAGNOSIS — Z23 Encounter for immunization: Secondary | ICD-10-CM | POA: Diagnosis not present

## 2019-07-15 DIAGNOSIS — N186 End stage renal disease: Secondary | ICD-10-CM | POA: Diagnosis not present

## 2019-07-18 DIAGNOSIS — E1129 Type 2 diabetes mellitus with other diabetic kidney complication: Secondary | ICD-10-CM | POA: Diagnosis not present

## 2019-07-18 DIAGNOSIS — N2581 Secondary hyperparathyroidism of renal origin: Secondary | ICD-10-CM | POA: Diagnosis not present

## 2019-07-18 DIAGNOSIS — Z992 Dependence on renal dialysis: Secondary | ICD-10-CM | POA: Diagnosis not present

## 2019-07-18 DIAGNOSIS — Z23 Encounter for immunization: Secondary | ICD-10-CM | POA: Diagnosis not present

## 2019-07-18 DIAGNOSIS — N186 End stage renal disease: Secondary | ICD-10-CM | POA: Diagnosis not present

## 2019-07-19 DIAGNOSIS — E1129 Type 2 diabetes mellitus with other diabetic kidney complication: Secondary | ICD-10-CM | POA: Diagnosis not present

## 2019-07-19 DIAGNOSIS — Z23 Encounter for immunization: Secondary | ICD-10-CM | POA: Diagnosis not present

## 2019-07-19 DIAGNOSIS — N186 End stage renal disease: Secondary | ICD-10-CM | POA: Diagnosis not present

## 2019-07-19 DIAGNOSIS — Z992 Dependence on renal dialysis: Secondary | ICD-10-CM | POA: Diagnosis not present

## 2019-07-19 DIAGNOSIS — N2581 Secondary hyperparathyroidism of renal origin: Secondary | ICD-10-CM | POA: Diagnosis not present

## 2019-07-22 DIAGNOSIS — N2581 Secondary hyperparathyroidism of renal origin: Secondary | ICD-10-CM | POA: Diagnosis not present

## 2019-07-22 DIAGNOSIS — N186 End stage renal disease: Secondary | ICD-10-CM | POA: Diagnosis not present

## 2019-07-22 DIAGNOSIS — Z23 Encounter for immunization: Secondary | ICD-10-CM | POA: Diagnosis not present

## 2019-07-22 DIAGNOSIS — Z992 Dependence on renal dialysis: Secondary | ICD-10-CM | POA: Diagnosis not present

## 2019-07-22 DIAGNOSIS — E1129 Type 2 diabetes mellitus with other diabetic kidney complication: Secondary | ICD-10-CM | POA: Diagnosis not present

## 2019-07-25 DIAGNOSIS — Z992 Dependence on renal dialysis: Secondary | ICD-10-CM | POA: Diagnosis not present

## 2019-07-25 DIAGNOSIS — E1129 Type 2 diabetes mellitus with other diabetic kidney complication: Secondary | ICD-10-CM | POA: Diagnosis not present

## 2019-07-25 DIAGNOSIS — N186 End stage renal disease: Secondary | ICD-10-CM | POA: Diagnosis not present

## 2019-07-25 DIAGNOSIS — N2581 Secondary hyperparathyroidism of renal origin: Secondary | ICD-10-CM | POA: Diagnosis not present

## 2019-07-25 DIAGNOSIS — Z23 Encounter for immunization: Secondary | ICD-10-CM | POA: Diagnosis not present

## 2019-07-27 DIAGNOSIS — E1129 Type 2 diabetes mellitus with other diabetic kidney complication: Secondary | ICD-10-CM | POA: Diagnosis not present

## 2019-07-27 DIAGNOSIS — Z992 Dependence on renal dialysis: Secondary | ICD-10-CM | POA: Diagnosis not present

## 2019-07-27 DIAGNOSIS — N2581 Secondary hyperparathyroidism of renal origin: Secondary | ICD-10-CM | POA: Diagnosis not present

## 2019-07-27 DIAGNOSIS — N186 End stage renal disease: Secondary | ICD-10-CM | POA: Diagnosis not present

## 2019-07-27 DIAGNOSIS — Z23 Encounter for immunization: Secondary | ICD-10-CM | POA: Diagnosis not present

## 2019-07-29 DIAGNOSIS — Z992 Dependence on renal dialysis: Secondary | ICD-10-CM | POA: Diagnosis not present

## 2019-07-29 DIAGNOSIS — Z23 Encounter for immunization: Secondary | ICD-10-CM | POA: Diagnosis not present

## 2019-07-29 DIAGNOSIS — N186 End stage renal disease: Secondary | ICD-10-CM | POA: Diagnosis not present

## 2019-07-29 DIAGNOSIS — N2581 Secondary hyperparathyroidism of renal origin: Secondary | ICD-10-CM | POA: Diagnosis not present

## 2019-07-29 DIAGNOSIS — E1129 Type 2 diabetes mellitus with other diabetic kidney complication: Secondary | ICD-10-CM | POA: Diagnosis not present

## 2019-07-31 DIAGNOSIS — N186 End stage renal disease: Secondary | ICD-10-CM | POA: Diagnosis not present

## 2019-07-31 DIAGNOSIS — Z992 Dependence on renal dialysis: Secondary | ICD-10-CM | POA: Diagnosis not present

## 2019-07-31 DIAGNOSIS — E1129 Type 2 diabetes mellitus with other diabetic kidney complication: Secondary | ICD-10-CM | POA: Diagnosis not present

## 2019-08-01 DIAGNOSIS — E1129 Type 2 diabetes mellitus with other diabetic kidney complication: Secondary | ICD-10-CM | POA: Diagnosis not present

## 2019-08-01 DIAGNOSIS — Z23 Encounter for immunization: Secondary | ICD-10-CM | POA: Diagnosis not present

## 2019-08-01 DIAGNOSIS — Z992 Dependence on renal dialysis: Secondary | ICD-10-CM | POA: Diagnosis not present

## 2019-08-01 DIAGNOSIS — N2581 Secondary hyperparathyroidism of renal origin: Secondary | ICD-10-CM | POA: Diagnosis not present

## 2019-08-01 DIAGNOSIS — N186 End stage renal disease: Secondary | ICD-10-CM | POA: Diagnosis not present

## 2019-08-03 DIAGNOSIS — Z23 Encounter for immunization: Secondary | ICD-10-CM | POA: Diagnosis not present

## 2019-08-03 DIAGNOSIS — N2581 Secondary hyperparathyroidism of renal origin: Secondary | ICD-10-CM | POA: Diagnosis not present

## 2019-08-03 DIAGNOSIS — N186 End stage renal disease: Secondary | ICD-10-CM | POA: Diagnosis not present

## 2019-08-03 DIAGNOSIS — Z992 Dependence on renal dialysis: Secondary | ICD-10-CM | POA: Diagnosis not present

## 2019-08-03 DIAGNOSIS — E1129 Type 2 diabetes mellitus with other diabetic kidney complication: Secondary | ICD-10-CM | POA: Diagnosis not present

## 2019-08-05 DIAGNOSIS — Z992 Dependence on renal dialysis: Secondary | ICD-10-CM | POA: Diagnosis not present

## 2019-08-05 DIAGNOSIS — E1129 Type 2 diabetes mellitus with other diabetic kidney complication: Secondary | ICD-10-CM | POA: Diagnosis not present

## 2019-08-05 DIAGNOSIS — N186 End stage renal disease: Secondary | ICD-10-CM | POA: Diagnosis not present

## 2019-08-05 DIAGNOSIS — N2581 Secondary hyperparathyroidism of renal origin: Secondary | ICD-10-CM | POA: Diagnosis not present

## 2019-08-05 DIAGNOSIS — Z23 Encounter for immunization: Secondary | ICD-10-CM | POA: Diagnosis not present

## 2019-08-08 DIAGNOSIS — N186 End stage renal disease: Secondary | ICD-10-CM | POA: Diagnosis not present

## 2019-08-08 DIAGNOSIS — E1129 Type 2 diabetes mellitus with other diabetic kidney complication: Secondary | ICD-10-CM | POA: Diagnosis not present

## 2019-08-08 DIAGNOSIS — Z23 Encounter for immunization: Secondary | ICD-10-CM | POA: Diagnosis not present

## 2019-08-08 DIAGNOSIS — N2581 Secondary hyperparathyroidism of renal origin: Secondary | ICD-10-CM | POA: Diagnosis not present

## 2019-08-08 DIAGNOSIS — Z992 Dependence on renal dialysis: Secondary | ICD-10-CM | POA: Diagnosis not present

## 2019-08-09 DIAGNOSIS — Z7901 Long term (current) use of anticoagulants: Secondary | ICD-10-CM | POA: Diagnosis not present

## 2019-08-09 DIAGNOSIS — I4891 Unspecified atrial fibrillation: Secondary | ICD-10-CM | POA: Diagnosis not present

## 2019-08-10 DIAGNOSIS — Z992 Dependence on renal dialysis: Secondary | ICD-10-CM | POA: Diagnosis not present

## 2019-08-10 DIAGNOSIS — E1129 Type 2 diabetes mellitus with other diabetic kidney complication: Secondary | ICD-10-CM | POA: Diagnosis not present

## 2019-08-10 DIAGNOSIS — N2581 Secondary hyperparathyroidism of renal origin: Secondary | ICD-10-CM | POA: Diagnosis not present

## 2019-08-10 DIAGNOSIS — N186 End stage renal disease: Secondary | ICD-10-CM | POA: Diagnosis not present

## 2019-08-10 DIAGNOSIS — Z23 Encounter for immunization: Secondary | ICD-10-CM | POA: Diagnosis not present

## 2019-08-11 DIAGNOSIS — N186 End stage renal disease: Secondary | ICD-10-CM | POA: Diagnosis not present

## 2019-08-11 DIAGNOSIS — T82858A Stenosis of vascular prosthetic devices, implants and grafts, initial encounter: Secondary | ICD-10-CM | POA: Diagnosis not present

## 2019-08-11 DIAGNOSIS — I871 Compression of vein: Secondary | ICD-10-CM | POA: Diagnosis not present

## 2019-08-11 DIAGNOSIS — Z992 Dependence on renal dialysis: Secondary | ICD-10-CM | POA: Diagnosis not present

## 2019-08-12 DIAGNOSIS — N2581 Secondary hyperparathyroidism of renal origin: Secondary | ICD-10-CM | POA: Diagnosis not present

## 2019-08-12 DIAGNOSIS — Z992 Dependence on renal dialysis: Secondary | ICD-10-CM | POA: Diagnosis not present

## 2019-08-12 DIAGNOSIS — N186 End stage renal disease: Secondary | ICD-10-CM | POA: Diagnosis not present

## 2019-08-12 DIAGNOSIS — Z23 Encounter for immunization: Secondary | ICD-10-CM | POA: Diagnosis not present

## 2019-08-12 DIAGNOSIS — E1129 Type 2 diabetes mellitus with other diabetic kidney complication: Secondary | ICD-10-CM | POA: Diagnosis not present

## 2019-08-15 DIAGNOSIS — N2581 Secondary hyperparathyroidism of renal origin: Secondary | ICD-10-CM | POA: Diagnosis not present

## 2019-08-15 DIAGNOSIS — E1129 Type 2 diabetes mellitus with other diabetic kidney complication: Secondary | ICD-10-CM | POA: Diagnosis not present

## 2019-08-15 DIAGNOSIS — N186 End stage renal disease: Secondary | ICD-10-CM | POA: Diagnosis not present

## 2019-08-15 DIAGNOSIS — Z23 Encounter for immunization: Secondary | ICD-10-CM | POA: Diagnosis not present

## 2019-08-15 DIAGNOSIS — Z992 Dependence on renal dialysis: Secondary | ICD-10-CM | POA: Diagnosis not present

## 2019-08-17 DIAGNOSIS — E1129 Type 2 diabetes mellitus with other diabetic kidney complication: Secondary | ICD-10-CM | POA: Diagnosis not present

## 2019-08-17 DIAGNOSIS — Z23 Encounter for immunization: Secondary | ICD-10-CM | POA: Diagnosis not present

## 2019-08-17 DIAGNOSIS — N186 End stage renal disease: Secondary | ICD-10-CM | POA: Diagnosis not present

## 2019-08-17 DIAGNOSIS — N2581 Secondary hyperparathyroidism of renal origin: Secondary | ICD-10-CM | POA: Diagnosis not present

## 2019-08-17 DIAGNOSIS — Z992 Dependence on renal dialysis: Secondary | ICD-10-CM | POA: Diagnosis not present

## 2019-08-19 DIAGNOSIS — N186 End stage renal disease: Secondary | ICD-10-CM | POA: Diagnosis not present

## 2019-08-19 DIAGNOSIS — N2581 Secondary hyperparathyroidism of renal origin: Secondary | ICD-10-CM | POA: Diagnosis not present

## 2019-08-19 DIAGNOSIS — E1129 Type 2 diabetes mellitus with other diabetic kidney complication: Secondary | ICD-10-CM | POA: Diagnosis not present

## 2019-08-19 DIAGNOSIS — Z992 Dependence on renal dialysis: Secondary | ICD-10-CM | POA: Diagnosis not present

## 2019-08-19 DIAGNOSIS — Z23 Encounter for immunization: Secondary | ICD-10-CM | POA: Diagnosis not present

## 2019-08-22 DIAGNOSIS — E1129 Type 2 diabetes mellitus with other diabetic kidney complication: Secondary | ICD-10-CM | POA: Diagnosis not present

## 2019-08-22 DIAGNOSIS — N2581 Secondary hyperparathyroidism of renal origin: Secondary | ICD-10-CM | POA: Diagnosis not present

## 2019-08-22 DIAGNOSIS — Z992 Dependence on renal dialysis: Secondary | ICD-10-CM | POA: Diagnosis not present

## 2019-08-22 DIAGNOSIS — N186 End stage renal disease: Secondary | ICD-10-CM | POA: Diagnosis not present

## 2019-08-22 DIAGNOSIS — Z23 Encounter for immunization: Secondary | ICD-10-CM | POA: Diagnosis not present

## 2019-08-23 ENCOUNTER — Ambulatory Visit: Payer: Medicare Other | Admitting: Podiatry

## 2019-08-24 DIAGNOSIS — Z992 Dependence on renal dialysis: Secondary | ICD-10-CM | POA: Diagnosis not present

## 2019-08-24 DIAGNOSIS — E1129 Type 2 diabetes mellitus with other diabetic kidney complication: Secondary | ICD-10-CM | POA: Diagnosis not present

## 2019-08-24 DIAGNOSIS — Z23 Encounter for immunization: Secondary | ICD-10-CM | POA: Diagnosis not present

## 2019-08-24 DIAGNOSIS — N186 End stage renal disease: Secondary | ICD-10-CM | POA: Diagnosis not present

## 2019-08-24 DIAGNOSIS — N2581 Secondary hyperparathyroidism of renal origin: Secondary | ICD-10-CM | POA: Diagnosis not present

## 2019-08-26 DIAGNOSIS — N186 End stage renal disease: Secondary | ICD-10-CM | POA: Diagnosis not present

## 2019-08-26 DIAGNOSIS — Z992 Dependence on renal dialysis: Secondary | ICD-10-CM | POA: Diagnosis not present

## 2019-08-26 DIAGNOSIS — Z23 Encounter for immunization: Secondary | ICD-10-CM | POA: Diagnosis not present

## 2019-08-26 DIAGNOSIS — N2581 Secondary hyperparathyroidism of renal origin: Secondary | ICD-10-CM | POA: Diagnosis not present

## 2019-08-26 DIAGNOSIS — E1129 Type 2 diabetes mellitus with other diabetic kidney complication: Secondary | ICD-10-CM | POA: Diagnosis not present

## 2019-08-29 DIAGNOSIS — E1129 Type 2 diabetes mellitus with other diabetic kidney complication: Secondary | ICD-10-CM | POA: Diagnosis not present

## 2019-08-29 DIAGNOSIS — N186 End stage renal disease: Secondary | ICD-10-CM | POA: Diagnosis not present

## 2019-08-29 DIAGNOSIS — N2581 Secondary hyperparathyroidism of renal origin: Secondary | ICD-10-CM | POA: Diagnosis not present

## 2019-08-29 DIAGNOSIS — Z992 Dependence on renal dialysis: Secondary | ICD-10-CM | POA: Diagnosis not present

## 2019-08-29 DIAGNOSIS — Z23 Encounter for immunization: Secondary | ICD-10-CM | POA: Diagnosis not present

## 2019-08-31 DIAGNOSIS — N2581 Secondary hyperparathyroidism of renal origin: Secondary | ICD-10-CM | POA: Diagnosis not present

## 2019-08-31 DIAGNOSIS — N186 End stage renal disease: Secondary | ICD-10-CM | POA: Diagnosis not present

## 2019-08-31 DIAGNOSIS — Z992 Dependence on renal dialysis: Secondary | ICD-10-CM | POA: Diagnosis not present

## 2019-08-31 DIAGNOSIS — E1129 Type 2 diabetes mellitus with other diabetic kidney complication: Secondary | ICD-10-CM | POA: Diagnosis not present

## 2019-09-02 DIAGNOSIS — E1129 Type 2 diabetes mellitus with other diabetic kidney complication: Secondary | ICD-10-CM | POA: Diagnosis not present

## 2019-09-02 DIAGNOSIS — Z992 Dependence on renal dialysis: Secondary | ICD-10-CM | POA: Diagnosis not present

## 2019-09-02 DIAGNOSIS — N186 End stage renal disease: Secondary | ICD-10-CM | POA: Diagnosis not present

## 2019-09-02 DIAGNOSIS — N2581 Secondary hyperparathyroidism of renal origin: Secondary | ICD-10-CM | POA: Diagnosis not present

## 2019-09-05 DIAGNOSIS — E1129 Type 2 diabetes mellitus with other diabetic kidney complication: Secondary | ICD-10-CM | POA: Diagnosis not present

## 2019-09-05 DIAGNOSIS — N186 End stage renal disease: Secondary | ICD-10-CM | POA: Diagnosis not present

## 2019-09-05 DIAGNOSIS — N2581 Secondary hyperparathyroidism of renal origin: Secondary | ICD-10-CM | POA: Diagnosis not present

## 2019-09-05 DIAGNOSIS — Z992 Dependence on renal dialysis: Secondary | ICD-10-CM | POA: Diagnosis not present

## 2019-09-07 DIAGNOSIS — N186 End stage renal disease: Secondary | ICD-10-CM | POA: Diagnosis not present

## 2019-09-07 DIAGNOSIS — Z992 Dependence on renal dialysis: Secondary | ICD-10-CM | POA: Diagnosis not present

## 2019-09-07 DIAGNOSIS — E1129 Type 2 diabetes mellitus with other diabetic kidney complication: Secondary | ICD-10-CM | POA: Diagnosis not present

## 2019-09-07 DIAGNOSIS — N2581 Secondary hyperparathyroidism of renal origin: Secondary | ICD-10-CM | POA: Diagnosis not present

## 2019-09-09 DIAGNOSIS — E1129 Type 2 diabetes mellitus with other diabetic kidney complication: Secondary | ICD-10-CM | POA: Diagnosis not present

## 2019-09-09 DIAGNOSIS — N186 End stage renal disease: Secondary | ICD-10-CM | POA: Diagnosis not present

## 2019-09-09 DIAGNOSIS — N2581 Secondary hyperparathyroidism of renal origin: Secondary | ICD-10-CM | POA: Diagnosis not present

## 2019-09-09 DIAGNOSIS — Z992 Dependence on renal dialysis: Secondary | ICD-10-CM | POA: Diagnosis not present

## 2019-09-12 DIAGNOSIS — N2581 Secondary hyperparathyroidism of renal origin: Secondary | ICD-10-CM | POA: Diagnosis not present

## 2019-09-12 DIAGNOSIS — Z992 Dependence on renal dialysis: Secondary | ICD-10-CM | POA: Diagnosis not present

## 2019-09-12 DIAGNOSIS — N186 End stage renal disease: Secondary | ICD-10-CM | POA: Diagnosis not present

## 2019-09-12 DIAGNOSIS — E1129 Type 2 diabetes mellitus with other diabetic kidney complication: Secondary | ICD-10-CM | POA: Diagnosis not present

## 2019-09-13 DIAGNOSIS — I4891 Unspecified atrial fibrillation: Secondary | ICD-10-CM | POA: Diagnosis not present

## 2019-09-13 DIAGNOSIS — Z7901 Long term (current) use of anticoagulants: Secondary | ICD-10-CM | POA: Diagnosis not present

## 2019-09-14 DIAGNOSIS — N2581 Secondary hyperparathyroidism of renal origin: Secondary | ICD-10-CM | POA: Diagnosis not present

## 2019-09-14 DIAGNOSIS — Z992 Dependence on renal dialysis: Secondary | ICD-10-CM | POA: Diagnosis not present

## 2019-09-14 DIAGNOSIS — E1129 Type 2 diabetes mellitus with other diabetic kidney complication: Secondary | ICD-10-CM | POA: Diagnosis not present

## 2019-09-14 DIAGNOSIS — N186 End stage renal disease: Secondary | ICD-10-CM | POA: Diagnosis not present

## 2019-09-16 DIAGNOSIS — N186 End stage renal disease: Secondary | ICD-10-CM | POA: Diagnosis not present

## 2019-09-16 DIAGNOSIS — N2581 Secondary hyperparathyroidism of renal origin: Secondary | ICD-10-CM | POA: Diagnosis not present

## 2019-09-16 DIAGNOSIS — E1129 Type 2 diabetes mellitus with other diabetic kidney complication: Secondary | ICD-10-CM | POA: Diagnosis not present

## 2019-09-16 DIAGNOSIS — Z992 Dependence on renal dialysis: Secondary | ICD-10-CM | POA: Diagnosis not present

## 2019-09-19 DIAGNOSIS — N2581 Secondary hyperparathyroidism of renal origin: Secondary | ICD-10-CM | POA: Diagnosis not present

## 2019-09-19 DIAGNOSIS — N186 End stage renal disease: Secondary | ICD-10-CM | POA: Diagnosis not present

## 2019-09-19 DIAGNOSIS — E1129 Type 2 diabetes mellitus with other diabetic kidney complication: Secondary | ICD-10-CM | POA: Diagnosis not present

## 2019-09-19 DIAGNOSIS — Z992 Dependence on renal dialysis: Secondary | ICD-10-CM | POA: Diagnosis not present

## 2019-09-21 DIAGNOSIS — N186 End stage renal disease: Secondary | ICD-10-CM | POA: Diagnosis not present

## 2019-09-21 DIAGNOSIS — Z992 Dependence on renal dialysis: Secondary | ICD-10-CM | POA: Diagnosis not present

## 2019-09-21 DIAGNOSIS — N2581 Secondary hyperparathyroidism of renal origin: Secondary | ICD-10-CM | POA: Diagnosis not present

## 2019-09-21 DIAGNOSIS — E1129 Type 2 diabetes mellitus with other diabetic kidney complication: Secondary | ICD-10-CM | POA: Diagnosis not present

## 2019-09-23 DIAGNOSIS — N186 End stage renal disease: Secondary | ICD-10-CM | POA: Diagnosis not present

## 2019-09-23 DIAGNOSIS — Z992 Dependence on renal dialysis: Secondary | ICD-10-CM | POA: Diagnosis not present

## 2019-09-23 DIAGNOSIS — N2581 Secondary hyperparathyroidism of renal origin: Secondary | ICD-10-CM | POA: Diagnosis not present

## 2019-09-23 DIAGNOSIS — E1129 Type 2 diabetes mellitus with other diabetic kidney complication: Secondary | ICD-10-CM | POA: Diagnosis not present

## 2019-09-26 DIAGNOSIS — E1129 Type 2 diabetes mellitus with other diabetic kidney complication: Secondary | ICD-10-CM | POA: Diagnosis not present

## 2019-09-26 DIAGNOSIS — Z992 Dependence on renal dialysis: Secondary | ICD-10-CM | POA: Diagnosis not present

## 2019-09-26 DIAGNOSIS — N2581 Secondary hyperparathyroidism of renal origin: Secondary | ICD-10-CM | POA: Diagnosis not present

## 2019-09-26 DIAGNOSIS — N186 End stage renal disease: Secondary | ICD-10-CM | POA: Diagnosis not present

## 2019-09-28 DIAGNOSIS — N186 End stage renal disease: Secondary | ICD-10-CM | POA: Diagnosis not present

## 2019-09-28 DIAGNOSIS — E1129 Type 2 diabetes mellitus with other diabetic kidney complication: Secondary | ICD-10-CM | POA: Diagnosis not present

## 2019-09-28 DIAGNOSIS — N2581 Secondary hyperparathyroidism of renal origin: Secondary | ICD-10-CM | POA: Diagnosis not present

## 2019-09-28 DIAGNOSIS — Z992 Dependence on renal dialysis: Secondary | ICD-10-CM | POA: Diagnosis not present

## 2019-09-30 DIAGNOSIS — D631 Anemia in chronic kidney disease: Secondary | ICD-10-CM | POA: Diagnosis not present

## 2019-09-30 DIAGNOSIS — E1129 Type 2 diabetes mellitus with other diabetic kidney complication: Secondary | ICD-10-CM | POA: Diagnosis not present

## 2019-09-30 DIAGNOSIS — N2581 Secondary hyperparathyroidism of renal origin: Secondary | ICD-10-CM | POA: Diagnosis not present

## 2019-09-30 DIAGNOSIS — Z992 Dependence on renal dialysis: Secondary | ICD-10-CM | POA: Diagnosis not present

## 2019-09-30 DIAGNOSIS — N186 End stage renal disease: Secondary | ICD-10-CM | POA: Diagnosis not present

## 2019-10-03 DIAGNOSIS — N2581 Secondary hyperparathyroidism of renal origin: Secondary | ICD-10-CM | POA: Diagnosis not present

## 2019-10-03 DIAGNOSIS — E1129 Type 2 diabetes mellitus with other diabetic kidney complication: Secondary | ICD-10-CM | POA: Diagnosis not present

## 2019-10-03 DIAGNOSIS — Z992 Dependence on renal dialysis: Secondary | ICD-10-CM | POA: Diagnosis not present

## 2019-10-03 DIAGNOSIS — N186 End stage renal disease: Secondary | ICD-10-CM | POA: Diagnosis not present

## 2019-10-03 DIAGNOSIS — D631 Anemia in chronic kidney disease: Secondary | ICD-10-CM | POA: Diagnosis not present

## 2019-10-04 DIAGNOSIS — E669 Obesity, unspecified: Secondary | ICD-10-CM | POA: Diagnosis not present

## 2019-10-04 DIAGNOSIS — E1129 Type 2 diabetes mellitus with other diabetic kidney complication: Secondary | ICD-10-CM | POA: Diagnosis not present

## 2019-10-04 DIAGNOSIS — Z992 Dependence on renal dialysis: Secondary | ICD-10-CM | POA: Diagnosis not present

## 2019-10-04 DIAGNOSIS — E1139 Type 2 diabetes mellitus with other diabetic ophthalmic complication: Secondary | ICD-10-CM | POA: Diagnosis not present

## 2019-10-04 DIAGNOSIS — E1140 Type 2 diabetes mellitus with diabetic neuropathy, unspecified: Secondary | ICD-10-CM | POA: Diagnosis not present

## 2019-10-04 DIAGNOSIS — I5022 Chronic systolic (congestive) heart failure: Secondary | ICD-10-CM | POA: Diagnosis not present

## 2019-10-05 DIAGNOSIS — N2581 Secondary hyperparathyroidism of renal origin: Secondary | ICD-10-CM | POA: Diagnosis not present

## 2019-10-05 DIAGNOSIS — D631 Anemia in chronic kidney disease: Secondary | ICD-10-CM | POA: Diagnosis not present

## 2019-10-05 DIAGNOSIS — N186 End stage renal disease: Secondary | ICD-10-CM | POA: Diagnosis not present

## 2019-10-05 DIAGNOSIS — E1129 Type 2 diabetes mellitus with other diabetic kidney complication: Secondary | ICD-10-CM | POA: Diagnosis not present

## 2019-10-05 DIAGNOSIS — Z992 Dependence on renal dialysis: Secondary | ICD-10-CM | POA: Diagnosis not present

## 2019-10-07 DIAGNOSIS — E1129 Type 2 diabetes mellitus with other diabetic kidney complication: Secondary | ICD-10-CM | POA: Diagnosis not present

## 2019-10-07 DIAGNOSIS — Z992 Dependence on renal dialysis: Secondary | ICD-10-CM | POA: Diagnosis not present

## 2019-10-07 DIAGNOSIS — D631 Anemia in chronic kidney disease: Secondary | ICD-10-CM | POA: Diagnosis not present

## 2019-10-07 DIAGNOSIS — N186 End stage renal disease: Secondary | ICD-10-CM | POA: Diagnosis not present

## 2019-10-07 DIAGNOSIS — N2581 Secondary hyperparathyroidism of renal origin: Secondary | ICD-10-CM | POA: Diagnosis not present

## 2019-10-10 DIAGNOSIS — Z992 Dependence on renal dialysis: Secondary | ICD-10-CM | POA: Diagnosis not present

## 2019-10-10 DIAGNOSIS — D631 Anemia in chronic kidney disease: Secondary | ICD-10-CM | POA: Diagnosis not present

## 2019-10-10 DIAGNOSIS — N186 End stage renal disease: Secondary | ICD-10-CM | POA: Diagnosis not present

## 2019-10-10 DIAGNOSIS — N2581 Secondary hyperparathyroidism of renal origin: Secondary | ICD-10-CM | POA: Diagnosis not present

## 2019-10-10 DIAGNOSIS — E1129 Type 2 diabetes mellitus with other diabetic kidney complication: Secondary | ICD-10-CM | POA: Diagnosis not present

## 2019-10-12 DIAGNOSIS — N186 End stage renal disease: Secondary | ICD-10-CM | POA: Diagnosis not present

## 2019-10-12 DIAGNOSIS — Z992 Dependence on renal dialysis: Secondary | ICD-10-CM | POA: Diagnosis not present

## 2019-10-12 DIAGNOSIS — E1129 Type 2 diabetes mellitus with other diabetic kidney complication: Secondary | ICD-10-CM | POA: Diagnosis not present

## 2019-10-12 DIAGNOSIS — N2581 Secondary hyperparathyroidism of renal origin: Secondary | ICD-10-CM | POA: Diagnosis not present

## 2019-10-12 DIAGNOSIS — D631 Anemia in chronic kidney disease: Secondary | ICD-10-CM | POA: Diagnosis not present

## 2019-10-14 DIAGNOSIS — N186 End stage renal disease: Secondary | ICD-10-CM | POA: Diagnosis not present

## 2019-10-14 DIAGNOSIS — E1129 Type 2 diabetes mellitus with other diabetic kidney complication: Secondary | ICD-10-CM | POA: Diagnosis not present

## 2019-10-14 DIAGNOSIS — N2581 Secondary hyperparathyroidism of renal origin: Secondary | ICD-10-CM | POA: Diagnosis not present

## 2019-10-14 DIAGNOSIS — Z992 Dependence on renal dialysis: Secondary | ICD-10-CM | POA: Diagnosis not present

## 2019-10-14 DIAGNOSIS — D631 Anemia in chronic kidney disease: Secondary | ICD-10-CM | POA: Diagnosis not present

## 2019-10-17 DIAGNOSIS — Z992 Dependence on renal dialysis: Secondary | ICD-10-CM | POA: Diagnosis not present

## 2019-10-17 DIAGNOSIS — N186 End stage renal disease: Secondary | ICD-10-CM | POA: Diagnosis not present

## 2019-10-17 DIAGNOSIS — D631 Anemia in chronic kidney disease: Secondary | ICD-10-CM | POA: Diagnosis not present

## 2019-10-17 DIAGNOSIS — E1129 Type 2 diabetes mellitus with other diabetic kidney complication: Secondary | ICD-10-CM | POA: Diagnosis not present

## 2019-10-17 DIAGNOSIS — N2581 Secondary hyperparathyroidism of renal origin: Secondary | ICD-10-CM | POA: Diagnosis not present

## 2019-10-19 ENCOUNTER — Telehealth: Payer: Self-pay | Admitting: Internal Medicine

## 2019-10-19 DIAGNOSIS — N2581 Secondary hyperparathyroidism of renal origin: Secondary | ICD-10-CM | POA: Diagnosis not present

## 2019-10-19 DIAGNOSIS — E1129 Type 2 diabetes mellitus with other diabetic kidney complication: Secondary | ICD-10-CM | POA: Diagnosis not present

## 2019-10-19 DIAGNOSIS — D631 Anemia in chronic kidney disease: Secondary | ICD-10-CM | POA: Diagnosis not present

## 2019-10-19 DIAGNOSIS — N186 End stage renal disease: Secondary | ICD-10-CM | POA: Diagnosis not present

## 2019-10-19 DIAGNOSIS — Z992 Dependence on renal dialysis: Secondary | ICD-10-CM | POA: Diagnosis not present

## 2019-10-19 NOTE — Telephone Encounter (Signed)
LM TO CALL BACK ./CY 

## 2019-10-19 NOTE — Telephone Encounter (Signed)
Patient wants to make sure it is okay that his CNA, [name], comes with him to his appt on 11/17/19 with [name]. He states she was able to come with him last time.

## 2019-10-19 NOTE — Telephone Encounter (Signed)
Called patient to schedule follow up visit w/Dr. Debara Pickett, no answer, left message

## 2019-10-19 NOTE — Telephone Encounter (Signed)
Patient scheduled for 11/17/19 with [name]

## 2019-10-19 NOTE — Telephone Encounter (Signed)
Called patient to schedule for appt, no answer, left voicemail

## 2019-10-20 ENCOUNTER — Ambulatory Visit: Payer: Medicare Other | Admitting: Podiatry

## 2019-10-20 NOTE — Telephone Encounter (Signed)
Left message for patient CNA approved to attend appointment.

## 2019-10-21 DIAGNOSIS — N2581 Secondary hyperparathyroidism of renal origin: Secondary | ICD-10-CM | POA: Diagnosis not present

## 2019-10-21 DIAGNOSIS — D631 Anemia in chronic kidney disease: Secondary | ICD-10-CM | POA: Diagnosis not present

## 2019-10-21 DIAGNOSIS — N186 End stage renal disease: Secondary | ICD-10-CM | POA: Diagnosis not present

## 2019-10-21 DIAGNOSIS — E1129 Type 2 diabetes mellitus with other diabetic kidney complication: Secondary | ICD-10-CM | POA: Diagnosis not present

## 2019-10-21 DIAGNOSIS — Z992 Dependence on renal dialysis: Secondary | ICD-10-CM | POA: Diagnosis not present

## 2019-10-24 DIAGNOSIS — Z992 Dependence on renal dialysis: Secondary | ICD-10-CM | POA: Diagnosis not present

## 2019-10-24 DIAGNOSIS — N2581 Secondary hyperparathyroidism of renal origin: Secondary | ICD-10-CM | POA: Diagnosis not present

## 2019-10-24 DIAGNOSIS — D631 Anemia in chronic kidney disease: Secondary | ICD-10-CM | POA: Diagnosis not present

## 2019-10-24 DIAGNOSIS — E1129 Type 2 diabetes mellitus with other diabetic kidney complication: Secondary | ICD-10-CM | POA: Diagnosis not present

## 2019-10-24 DIAGNOSIS — N186 End stage renal disease: Secondary | ICD-10-CM | POA: Diagnosis not present

## 2019-10-26 DIAGNOSIS — D631 Anemia in chronic kidney disease: Secondary | ICD-10-CM | POA: Diagnosis not present

## 2019-10-26 DIAGNOSIS — Z992 Dependence on renal dialysis: Secondary | ICD-10-CM | POA: Diagnosis not present

## 2019-10-26 DIAGNOSIS — N2581 Secondary hyperparathyroidism of renal origin: Secondary | ICD-10-CM | POA: Diagnosis not present

## 2019-10-26 DIAGNOSIS — E1129 Type 2 diabetes mellitus with other diabetic kidney complication: Secondary | ICD-10-CM | POA: Diagnosis not present

## 2019-10-26 DIAGNOSIS — N186 End stage renal disease: Secondary | ICD-10-CM | POA: Diagnosis not present

## 2019-10-27 DIAGNOSIS — E113213 Type 2 diabetes mellitus with mild nonproliferative diabetic retinopathy with macular edema, bilateral: Secondary | ICD-10-CM | POA: Diagnosis not present

## 2019-10-28 DIAGNOSIS — N2581 Secondary hyperparathyroidism of renal origin: Secondary | ICD-10-CM | POA: Diagnosis not present

## 2019-10-28 DIAGNOSIS — N186 End stage renal disease: Secondary | ICD-10-CM | POA: Diagnosis not present

## 2019-10-28 DIAGNOSIS — E1129 Type 2 diabetes mellitus with other diabetic kidney complication: Secondary | ICD-10-CM | POA: Diagnosis not present

## 2019-10-28 DIAGNOSIS — Z992 Dependence on renal dialysis: Secondary | ICD-10-CM | POA: Diagnosis not present

## 2019-10-28 DIAGNOSIS — D631 Anemia in chronic kidney disease: Secondary | ICD-10-CM | POA: Diagnosis not present

## 2019-11-01 DIAGNOSIS — Z7901 Long term (current) use of anticoagulants: Secondary | ICD-10-CM | POA: Diagnosis not present

## 2019-11-01 DIAGNOSIS — I4891 Unspecified atrial fibrillation: Secondary | ICD-10-CM | POA: Diagnosis not present

## 2019-11-06 DIAGNOSIS — T82858A Stenosis of vascular prosthetic devices, implants and grafts, initial encounter: Secondary | ICD-10-CM | POA: Diagnosis not present

## 2019-11-06 DIAGNOSIS — N186 End stage renal disease: Secondary | ICD-10-CM | POA: Diagnosis not present

## 2019-11-06 DIAGNOSIS — Z992 Dependence on renal dialysis: Secondary | ICD-10-CM | POA: Diagnosis not present

## 2019-11-06 DIAGNOSIS — I871 Compression of vein: Secondary | ICD-10-CM | POA: Diagnosis not present

## 2019-11-17 ENCOUNTER — Encounter: Payer: Self-pay | Admitting: Physician Assistant

## 2019-11-17 ENCOUNTER — Other Ambulatory Visit: Payer: Self-pay

## 2019-11-17 ENCOUNTER — Ambulatory Visit (INDEPENDENT_AMBULATORY_CARE_PROVIDER_SITE_OTHER): Payer: Medicare Other | Admitting: Physician Assistant

## 2019-11-17 VITALS — BP 119/67 | HR 100 | Ht 75.5 in | Wt 265.2 lb

## 2019-11-17 DIAGNOSIS — Z992 Dependence on renal dialysis: Secondary | ICD-10-CM | POA: Diagnosis not present

## 2019-11-17 DIAGNOSIS — E118 Type 2 diabetes mellitus with unspecified complications: Secondary | ICD-10-CM

## 2019-11-17 DIAGNOSIS — I3139 Other pericardial effusion (noninflammatory): Secondary | ICD-10-CM

## 2019-11-17 DIAGNOSIS — E119 Type 2 diabetes mellitus without complications: Secondary | ICD-10-CM

## 2019-11-17 DIAGNOSIS — I314 Cardiac tamponade: Secondary | ICD-10-CM

## 2019-11-17 DIAGNOSIS — N186 End stage renal disease: Secondary | ICD-10-CM

## 2019-11-17 DIAGNOSIS — E785 Hyperlipidemia, unspecified: Secondary | ICD-10-CM

## 2019-11-17 DIAGNOSIS — I428 Other cardiomyopathies: Secondary | ICD-10-CM

## 2019-11-17 DIAGNOSIS — Z794 Long term (current) use of insulin: Secondary | ICD-10-CM

## 2019-11-17 DIAGNOSIS — I959 Hypotension, unspecified: Secondary | ICD-10-CM | POA: Diagnosis not present

## 2019-11-17 DIAGNOSIS — I313 Pericardial effusion (noninflammatory): Secondary | ICD-10-CM

## 2019-11-17 NOTE — Patient Instructions (Signed)
Medication Instructions:  No Changes *If you need a refill on your cardiac medications before your next appointment, please call your pharmacy*   Lab Work: None If you have labs (blood work) drawn today and your tests are completely normal, you will receive your results only by: Marland Kitchen MyChart Message (if you have MyChart) OR . A paper copy in the mail If you have any lab test that is abnormal or we need to change your treatment, we will call you to review the results.   Testing/Procedures: None   Follow-Up: At [facility], you and your health needs are our priority.  As part of our continuing mission to provide you with exceptional heart care, we have created designated Provider Care Teams.  These Care Teams include your primary Cardiologist (physician) and Advanced Practice Providers (APPs -  Physician Assistants and Nurse Practitioners) who all work together to provide you with the care you need, when you need it.  We recommend signing up for the patient portal called "MyChart".  Sign up information is provided on this After Visit Summary.  MyChart is used to connect with patients for Virtual Visits (Telemedicine).  Patients are able to view lab/test results, encounter notes, upcoming appointments, etc.  Non-urgent messages can be sent to your provider as well.   To learn more about what you can do with MyChart, go to [email or web address].    Your next appointment:   6 month(s)  The format for your next appointment:   In Person  Provider:   K. Mali Hilty, MD

## 2019-11-17 NOTE — Progress Notes (Signed)
Cardiology Office Note:    Date:  11/19/2019   ID:  Deleon, Passe 03-11-1951, MRN [id number]  PCP:  Burnard Bunting, MD  [facility] Cardiologist:  Pixie Casino, MD  [place] Electrophysiologist:  Will [name], MD   Referring MD: Burnard Bunting, MD   Chief Complaint  Patient presents with  . Follow-up    seen for Dr. Debara Pickett    History of Present Illness:    [name] is a 69 y.o. male with a hx of ESRD on HD, DM 2 with nephropathy and retinopathy, history of DVT with filter placed in 2014, hypertension, hyperlipidemia, DM 2 on insulin, atrial flutter and history of pericardial effusion.  Patient was diagnosed with atrial fibrillation in 2016.  Echocardiogram obtained on 08/05/2016 showed EF down to 15 to 20%, severely dilated left atrium, moderate MR and severely reduced RVEF.  Subsequent Myoview obtained on 08/14/2016 was abnormal with EF 29%, fixed medium sized and mild intensity basal to apical inferolateral perfusion defect representing possible previous infarction with no ischemia.  Cardiac catheterization performed on 09/11/2016 showed widely patent coronary arteries with luminal irregularities in the LAD and the RCA, EF less than 25%.  Patient subsequently underwent cardioversion.  Patient previously developed a large pericardial effusion in the setting of septic shock and hypotension in August 2019.  Limited echocardiogram obtained on 01/10/2018 showed large hemorrhagic pericardial effusion with EF 20%.  Due to tamponade physiology, she ultimately was taken to the OR and underwent pericardial window by Dr. Roxy Manns on 01/10/2018.  Postprocedure, repeat echocardiogram obtained on 01/14/2018 showed EF 30 to 35%, PA peak pressure 44 mmHg, no pericardial effusion, no significant valve issue.  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  He was previously turned down for renal transplant at both [place] due to cardiomyopathy however with significant improvement in the ejection fraction and no obstructive coronary artery disease on the previous cath, Dr. Debara Pickett was hoping he will be renal transplant candidate in the future.  Patient presents today for a cardiology office visit.  Her nephrologist is Dr. Marval Regal.  He is currently on hemodialysis every Tuesday, Thursday and Saturday.  Coumadin level is being checked every 2 weeks at outside facility.  Based on the patient's report, last INR was 2.5.  Otherwise he denies any chest pain or significant dyspnea.  Unfortunately he is still on midodrine due to significant hypotension developed during dialysis.  However with his significant improvement in the ejection fraction, I plan to send a message to [facility] renal transplant team to see if he would be a candidate by this point.  Their last note mentions he was mainly turned down because he was on midodrine however ejection fraction has significantly improved since he was last evaluated by them.  Otherwise he can follow-up in 6 months.  Past Medical History:  Diagnosis Date  . Anemia   . Arthritis    HNP- lumbar, "all over my body"  . Blood transfusion    "years ago; blood was low" (08/05/2013)  . CKD (chronic kidney disease) stage 4, GFR 15-29 ml/min (HCC) 03/18/2012   [place]- T,TH,Sat.  . Diabetic nephropathy ([place])   . Diabetic retinopathy   . DVT (deep venous thrombosis) ([place])    "got one in my right leg now; I've had one before too, not  sure which leg" (08/05/2013)  . ESRD (end stage renal disease) on dialysis [facility])    "just started today, (08/04/2013)"  . Family history of anesthesia complication    " my son wakes up slowly"  . GERD (gastroesophageal reflux disease)    uses alka seltzere on occas.   [name])    "one q now and then" (08/05/2013)  . Hyperlipidemia   .  Hypertension   . 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 Non Hodgkin's lymphoma ([place])    Tx 2009; "had chemo; it went away" (08/05/2013)  . Noncompliance 03/16/2012  . NSVT (nonsustained ventricular tachycardia) ([place]) 03/18/2012  . Peripheral vascular disease ([place])   . Pneumonia 2013   hosp.-   . Poor historian    pt. unsure of several answers to health history questions   . Skin cancer    melanoma - head  . Sleep apnea    "suppose to have a sleep study, but they never told me when. (08/05/2013)    Past Surgical History:  Procedure Laterality Date  . ACHILLES TENDON SURGERY Right 03/13/2016   Procedure: ACHILLES LENGTHENING/KIDNER;  Surgeon: Edrick Kins, DPM;  Location: [place];  Service: Podiatry;  Laterality: Right;  . AV FISTULA PLACEMENT Left 02/03/2013   Procedure: ARTERIOVENOUS (AV) FISTULA CREATION- LEFT RADIAL CEPHALIC; ULTRASOUND GUIDED;  Surgeon: Mal Misty, MD;  Location: [facility] OR;  Service: Vascular;  Laterality: Left;  . AV FISTULA PLACEMENT Right 11/08/2015   Procedure: RIGHT BRACHIOCEPHALIC ARTERIOVENOUS (AV) FISTULA CREATION;  Surgeon: Serafina Mitchell, MD;  Location: [place];  Service: Vascular;  Laterality: Right;  . BASCILIC VEIN TRANSPOSITION Right 01/24/2016   Procedure: RIGHT SECOND STAGE BASILIC VEIN TRANSPOSITION;  Surgeon: Angelia Mould, MD;  Location: [place];  Service: Vascular;  Laterality: Right;  . CARDIAC CATHETERIZATION    . COLONOSCOPY N/A 09/23/2015   Procedure: COLONOSCOPY;  Surgeon: Irene Shipper, MD;  Location: [facility] END[facility];  Service: Endoscopy;  Laterality: N/A;  . Coloscopy    . EYE SURGERY Bilateral   . GAS INSERTION  05/10/2012   Procedure: INSERTION OF GAS;  Surgeon: Hayden Pedro, MD;  Location: [place];  Service: Ophthalmology;  Laterality: Right;  . LEFT HEART CATH AND CORONARY ANGIOGRAPHY N/A 09/11/2016   Procedure: Left Heart Cath and Coronary Angiography;  Surgeon:  Belva Crome, MD;  Location: [place] CV LAB;  Service: Cardiovascular;  Laterality: N/A;  . LESION EXCISION Right 05/08/2015   Procedure: EXCISION SCALP LESION;  Surgeon: Erroll Luna, MD;  Location: [place];  Service: General;  Laterality: Right;  . MEMBRANE PEEL  05/10/2012   Procedure: MEMBRANE PEEL;  Surgeon: Hayden Pedro, MD;  Location: [place];  Service: Ophthalmology;  Laterality: Right;  . PARS PLANA VITRECTOMY  08/27/2011   Procedure: PARS PLANA VITRECTOMY WITH 25 GAUGE;  Surgeon: Hayden Pedro, MD;  Location: [place];  Service: Ophthalmology;  Laterality: Left;  Repair of complex traction retinal detachment left eye  . PARS PLANA VITRECTOMY  05/10/2012   Procedure: PARS PLANA VITRECTOMY WITH 25 GAUGE;  Surgeon: Hayden Pedro, MD;  Location: [place];  Service: Ophthalmology;  Laterality: Right;  Repair Complex Traction Retinal Detachment  . PHOTOCOAGULATION WITH LASER  05/10/2012   Procedure: PHOTOCOAGULATION WITH LASER;  Surgeon: Hayden Pedro, MD;  Location: [place];  Service: Ophthalmology;  Laterality: Right;  . PORT-A-CATH REMOVAL    .  PORTACATH PLACEMENT    . SUBXYPHOID PERICARDIAL WINDOW N/A 01/10/2018   Procedure: SUBXYPHOID PERICARDIAL WINDOW;  Surgeon: Rexene Alberts, MD;  Location: [place];  Service: Thoracic;  Laterality: N/A;  . TRANSMETATARSAL AMPUTATION Right 03/13/2016   Procedure: TRANSMETATARSAL AMPUTATION;  Surgeon: Edrick Kins, DPM;  Location: [place];  Service: Podiatry;  Laterality: Right;  . VENA CAVA FILTER PLACEMENT  09/2012   due to preparation for surgery    Current Medications: Current Meds  Medication Sig  . acetaminophen (TYLENOL) 325 MG tablet Take 650 mg by mouth every 6 (six) hours as needed (pain).  . B Complex-C-Folic Acid (RENA-VITE PO) Take by mouth.  . Cholecalciferol (VITAMIN D3) 50000 units CAPS Take 1 capsule by mouth daily.  . insulin aspart (NOVOLOG) 100 UNIT/ML injection Inject 1-9 Units into the skin 3 (three) times daily before  meals.  . insulin glargine (LANTUS) 100 UNIT/ML injection Inject 0.15 mLs (15 Units total) into the skin at bedtime.  . lidocaine-prilocaine (EMLA) cream Apply 1 application topically as needed (Apply small amount to access site 1-2 hours before dialysis. Cover with occlusive dressing (saran wrap)).   . midodrine (PROAMATINE) 5 MG tablet Take 5 mg by mouth 3 (three) times daily with meals. Take ONLY on DIALYSIS DAYS  . MULTIPLE VITAMINS-MINERALS PO Take by mouth. 1 tablet PO daily  . oseltamivir (TAMIFLU) 30 MG capsule TAKE 1 CAPSULE BY MOUTH TODAY THEN AFTER DIALYSIS ON SAT TUES THUR SAT TUES  . polysaccharide iron (NIFEREX) 150 MG CAPS capsule Take 1 capsule (150 mg total) by mouth daily.  . sevelamer carbonate (RENVELA) 800 MG tablet Take 1,600 mg by mouth 3 (three) times daily with meals.  . Skin Protectants, Misc. (MINERIN) CREA Apply topically. Topically every 12 hours  . traMADol (ULTRAM) 50 MG tablet Take 1 tablet (50 mg total) by mouth every 6 (six) hours as needed.  . vitamin C (ASCORBIC ACID) 500 MG tablet Take 500 mg by mouth 2 (two) times daily.  Marland Kitchen warfarin (COUMADIN) 5 MG tablet TAKE 1 TABLET BY MOUTH ON TUES WEDS & FRI. TAKE 1 & 1 2 TABLETS MON THURS SAT & SUN  . zinc sulfate 220 (50 Zn) MG capsule Take 220 mg by mouth daily.     Allergies:   Patient has no known allergies.   Social History   Socioeconomic History  . Marital status: Single    Spouse name: Not on file  . Number of children: 1  . Years of education: Not on file  . Highest education level: Not on file  Occupational History  . Occupation: Retired     Comment: truck Geophysicist/field seismologist  Tobacco Use  . Smoking status: Never Smoker  . Smokeless tobacco: Never Used  Substance and Sexual Activity  . Alcohol use: No    Alcohol/week: 0.0 standard drinks  . Drug use: No  . Sexual activity: Not Currently  Other Topics Concern  . Not on file  Social History Narrative  . Not on file   Social Determinants of Health    Financial Resource Strain:   . Difficulty of Paying Living Expenses:   Food Insecurity:   . Worried About Charity fundraiser in the Last Year:   . Arboriculturist in the Last Year:   Transportation Needs:   . Film/video editor (Medical):   Marland Kitchen Lack of Transportation (Non-Medical):   Physical Activity:   . Days of Exercise per Week:   . Minutes of Exercise per  Session:   Stress:   . Feeling of Stress :   Social Connections:   . Frequency of Communication with Friends and Family:   . Frequency of Social Gatherings with Friends and Family:   . Attends Religious Services:   . Active Member of Clubs or Organizations:   . Attends Archivist Meetings:   Marland Kitchen Marital Status:      Family History: The patient's family history includes Anesthesia problems in his son; Diabetes in his father; Hypertension in his father and son; Other in his father. There is no history of Colon cancer.  ROS:   Please see the history of present illness.     All other systems reviewed and are negative.  EKGs/Labs/Other Studies Reviewed:    The following studies were reviewed today:  Echo 04/07/2019 1. Left ventricular ejection fraction, by visual estimation, is 50 to  55%. The left ventricle has normal function. There is moderately increased  left ventricular hypertrophy.  2. Elevated left atrial and left ventricular end-diastolic pressures.  3. Left ventricular diastolic parameters are consistent with Grade II  diastolic dysfunction (pseudonormalization).  4. Global right ventricle has mildly reduced systolic function.The right  ventricular size is normal. No increase in right ventricular wall  thickness.  5. Left atrial size was moderately dilated.  6. Right atrial size was normal.  7. The mitral valve is normal in structure. Mild mitral valve  regurgitation. No evidence of mitral stenosis.  8. The tricuspid valve is normal in structure. Tricuspid valve  regurgitation is mild.   9. The aortic valve is normal in structure. Aortic valve regurgitation is  not visualized. No evidence of aortic valve sclerosis or stenosis.  10. There is Moderate thickening of the aortic valve.  11. The pulmonic valve was normal in structure. Pulmonic valve  regurgitation is not visualized.  12. Aneurysm of the ascending aorta, measuring 40 mm.  13. Moderately elevated pulmonary artery systolic pressure.  14. The tricuspid regurgitant velocity is 3.06 m/s, and with an assumed  right atrial pressure of 10 mmHg, the estimated right ventricular systolic  pressure is moderately elevated at 47.3 mmHg.  15. The inferior vena cava is normal in size with greater than 50%  respiratory variability, suggesting right atrial pressure of 3 mmHg.  16. Since the last study on 01/14/2018 LVEF has increased from 30-35% to  50-55%.   EKG:  EKG is ordered today.  The ekg ordered today demonstrates sinus rhythm versus 2-1 atrial flutter, right bundle branch block.  Recent Labs: No results found for requested labs within last 8760 hours.  Recent Lipid Panel    Component Value Date/Time   CHOL 129 01/14/2011 0437   TRIG 137 01/14/2011 0437   HDL 13 (L) 01/14/2011 0437   CHOLHDL 9.9 01/14/2011 0437   VLDL 27 01/14/2011 0437   LDLCALC 89 01/14/2011 0437    Physical Exam:    VS:  BP 119/67   Pulse 100   Ht 6' 3.5" (1.918 m)   Wt 265 lb 3.2 oz (120.3 kg)   SpO2 99%   BMI 32.71 kg/m     Wt Readings from Last 3 Encounters:  11/17/19 265 lb 3.2 oz (120.3 kg)  05/19/19 245 lb (111.1 kg)  09/21/18 245 lb (111.1 kg)     GEN:  Well nourished, well developed in no acute distress HEENT: Normal NECK: No JVD; No carotid bruits LYMPHATICS: No lymphadenopathy CARDIAC: RRR, no murmurs, rubs, gallops RESPIRATORY:  Clear to auscultation without rales,  wheezing or rhonchi  ABDOMEN: Soft, non-tender, non-distended MUSCULOSKELETAL:  No edema; No deformity  SKIN: Warm and dry NEUROLOGIC:  Alert and  oriented x 3 PSYCHIATRIC:  Normal affect   ASSESSMENT:    1. NICM (nonischemic cardiomyopathy) ([place])   2. Hypotension, unspecified hypotension type   3. ESRD (end stage renal disease) on dialysis ([place])   4. Controlled type 2 diabetes mellitus without complication, without long-term current use of insulin ([place])   5. Hyperlipidemia LDL goal <100   6. Controlled type 2 diabetes mellitus with complication, with long-term current use of insulin ([place])   7. Pericardial effusion with cardiac tamponade    PLAN:    In order of problems listed above:  1. Nonischemic cardiomyopathy: Ejection fraction has normalized based on last echocardiogram in November 2020.  2. History of hypotension: He requires midodrine on dialysis days  3. End-stage renal disease on hemodialysis: Dialysis on Tuesday, Thursday and Saturday.  Patient was previously turned down for renal transplant by [facility] due to LV dysfunction and midodrine use.  He is still on midodrine, however ejection fraction has normalized based on last echocardiogram.  4. Hyperlipidemia: We will defer annual lipid panel to primary care provider.  Patient is currently not on any statin therapy.  5. DM2: Managed by primary care provider.  On insulin  6. History of pericardial effusion: Status post pericardial window in 2019.  7. History of DVT: On chronic anticoagulation therapy with Coumadin.   Medication Adjustments/Labs and Tests Ordered: Current medicines are reviewed at length with the patient today.  Concerns regarding medicines are outlined above.  Orders Placed This Encounter  Procedures  . EKG 12-Lead   No orders of the defined types were placed in this encounter.   Patient Instructions  Medication Instructions:  No Changes *If you need a refill on your cardiac medications before your next appointment, please call your pharmacy*   Lab Work: None If you have labs (blood work) drawn today and your tests are completely  normal, you will receive your results only by: Marland Kitchen MyChart Message (if you have MyChart) OR . A paper copy in the mail If you have any lab test that is abnormal or we need to change your treatment, we will call you to review the results.   Testing/Procedures: None   Follow-Up: At [facility], you and your health needs are our priority.  As part of our continuing mission to provide you with exceptional heart care, we have created designated Provider Care Teams.  These Care Teams include your primary Cardiologist (physician) and Advanced Practice Providers (APPs -  Physician Assistants and Nurse Practitioners) who all work together to provide you with the care you need, when you need it.  We recommend signing up for the patient portal called "MyChart".  Sign up information is provided on this After Visit Summary.  MyChart is used to connect with patients for Virtual Visits (Telemedicine).  Patients are able to view lab/test results, encounter notes, upcoming appointments, etc.  Non-urgent messages can be sent to your provider as well.   To learn more about what you can do with MyChart, go to [email or web address].    Your next appointment:   6 month(s)  The format for your next appointment:   In Person  Provider:   K. Mali Hilty, MD        Signed, [name], [place]  11/19/2019 11:14 PM    [place]

## 2019-11-19 ENCOUNTER — Encounter: Payer: Self-pay | Admitting: Physician Assistant

## 2019-11-30 DIAGNOSIS — Z992 Dependence on renal dialysis: Secondary | ICD-10-CM | POA: Diagnosis not present

## 2019-11-30 DIAGNOSIS — D509 Iron deficiency anemia, unspecified: Secondary | ICD-10-CM | POA: Diagnosis not present

## 2019-11-30 DIAGNOSIS — E1129 Type 2 diabetes mellitus with other diabetic kidney complication: Secondary | ICD-10-CM | POA: Diagnosis not present

## 2019-11-30 DIAGNOSIS — N2581 Secondary hyperparathyroidism of renal origin: Secondary | ICD-10-CM | POA: Diagnosis not present

## 2019-11-30 DIAGNOSIS — N186 End stage renal disease: Secondary | ICD-10-CM | POA: Diagnosis not present

## 2019-12-02 DIAGNOSIS — D509 Iron deficiency anemia, unspecified: Secondary | ICD-10-CM | POA: Diagnosis not present

## 2019-12-02 DIAGNOSIS — Z992 Dependence on renal dialysis: Secondary | ICD-10-CM | POA: Diagnosis not present

## 2019-12-02 DIAGNOSIS — N186 End stage renal disease: Secondary | ICD-10-CM | POA: Diagnosis not present

## 2019-12-02 DIAGNOSIS — E1129 Type 2 diabetes mellitus with other diabetic kidney complication: Secondary | ICD-10-CM | POA: Diagnosis not present

## 2019-12-02 DIAGNOSIS — N2581 Secondary hyperparathyroidism of renal origin: Secondary | ICD-10-CM | POA: Diagnosis not present

## 2019-12-05 DIAGNOSIS — Z992 Dependence on renal dialysis: Secondary | ICD-10-CM | POA: Diagnosis not present

## 2019-12-05 DIAGNOSIS — N2581 Secondary hyperparathyroidism of renal origin: Secondary | ICD-10-CM | POA: Diagnosis not present

## 2019-12-05 DIAGNOSIS — E1129 Type 2 diabetes mellitus with other diabetic kidney complication: Secondary | ICD-10-CM | POA: Diagnosis not present

## 2019-12-05 DIAGNOSIS — N186 End stage renal disease: Secondary | ICD-10-CM | POA: Diagnosis not present

## 2019-12-05 DIAGNOSIS — D509 Iron deficiency anemia, unspecified: Secondary | ICD-10-CM | POA: Diagnosis not present

## 2019-12-07 DIAGNOSIS — N2581 Secondary hyperparathyroidism of renal origin: Secondary | ICD-10-CM | POA: Diagnosis not present

## 2019-12-07 DIAGNOSIS — D509 Iron deficiency anemia, unspecified: Secondary | ICD-10-CM | POA: Diagnosis not present

## 2019-12-07 DIAGNOSIS — N186 End stage renal disease: Secondary | ICD-10-CM | POA: Diagnosis not present

## 2019-12-07 DIAGNOSIS — E1129 Type 2 diabetes mellitus with other diabetic kidney complication: Secondary | ICD-10-CM | POA: Diagnosis not present

## 2019-12-07 DIAGNOSIS — Z992 Dependence on renal dialysis: Secondary | ICD-10-CM | POA: Diagnosis not present

## 2019-12-09 DIAGNOSIS — N2581 Secondary hyperparathyroidism of renal origin: Secondary | ICD-10-CM | POA: Diagnosis not present

## 2019-12-09 DIAGNOSIS — N186 End stage renal disease: Secondary | ICD-10-CM | POA: Diagnosis not present

## 2019-12-09 DIAGNOSIS — E1129 Type 2 diabetes mellitus with other diabetic kidney complication: Secondary | ICD-10-CM | POA: Diagnosis not present

## 2019-12-09 DIAGNOSIS — D509 Iron deficiency anemia, unspecified: Secondary | ICD-10-CM | POA: Diagnosis not present

## 2019-12-09 DIAGNOSIS — Z992 Dependence on renal dialysis: Secondary | ICD-10-CM | POA: Diagnosis not present

## 2019-12-12 DIAGNOSIS — Z992 Dependence on renal dialysis: Secondary | ICD-10-CM | POA: Diagnosis not present

## 2019-12-12 DIAGNOSIS — N186 End stage renal disease: Secondary | ICD-10-CM | POA: Diagnosis not present

## 2019-12-12 DIAGNOSIS — E1129 Type 2 diabetes mellitus with other diabetic kidney complication: Secondary | ICD-10-CM | POA: Diagnosis not present

## 2019-12-12 DIAGNOSIS — D509 Iron deficiency anemia, unspecified: Secondary | ICD-10-CM | POA: Diagnosis not present

## 2019-12-12 DIAGNOSIS — N2581 Secondary hyperparathyroidism of renal origin: Secondary | ICD-10-CM | POA: Diagnosis not present

## 2019-12-13 ENCOUNTER — Encounter: Payer: Self-pay | Admitting: Podiatry

## 2019-12-13 ENCOUNTER — Ambulatory Visit (INDEPENDENT_AMBULATORY_CARE_PROVIDER_SITE_OTHER): Payer: Medicare Other | Admitting: Podiatry

## 2019-12-13 ENCOUNTER — Other Ambulatory Visit: Payer: Self-pay

## 2019-12-13 DIAGNOSIS — Z89431 Acquired absence of right foot: Secondary | ICD-10-CM

## 2019-12-13 DIAGNOSIS — B351 Tinea unguium: Secondary | ICD-10-CM

## 2019-12-13 DIAGNOSIS — M79676 Pain in unspecified toe(s): Secondary | ICD-10-CM

## 2019-12-13 DIAGNOSIS — E0842 Diabetes mellitus due to underlying condition with diabetic polyneuropathy: Secondary | ICD-10-CM

## 2019-12-13 DIAGNOSIS — I739 Peripheral vascular disease, unspecified: Secondary | ICD-10-CM | POA: Diagnosis not present

## 2019-12-13 NOTE — Progress Notes (Signed)
Complaint:  Visit Type: Patient returns to my office for continued preventative foot care services. Complaint: Patient states" my nails have grown long and thick and become painful to walk and wear shoes on his left foot.  Patient has been diagnosed with DM with angiopathy.  Patient has had transmetatarsal amputation performed toes right foot.. The patient presents for preventative foot care services. No changes to ROS  Podiatric Exam: Vascular: dorsalis pedis and posterior tibial pulses are not  palpable left.  ... Capillary return is immediate. Temperature gradient is WNL. Skin turgor WNL  Significant swelling feet  Left  Sensorium: Normal Semmes Weinstein monofilament test. Normal tactile sensation left foot. Nail Exam: Pt has thick disfigured discolored nails with subungual debris noted  entire nail hallux through fifth toenails left foot. Ulcer Exam: There is no evidence of ulcer or pre-ulcerative changes or infection. Orthopedic Exam: Muscle tone and strength are WNL. No limitations in general ROM. No crepitus or effusions noted. Foot type and digits show no abnormalities. Bony prominences are unremarkable. TMA right foot. Skin: No Porokeratosis. No infection or ulcers  Diagnosis:  Onychomycosis, ,, pain in left toes,  TMA right foot.  Treatment & Plan Procedures and Treatment: Consent by patient was obtained for treatment procedures.   Debridement of mycotic and hypertrophic toenails, 1 through 5 left foot  and clearing of subungual debris. No ulceration, no infection noted.  Return Visit-Office Procedure: Patient instructed to return to the office for a follow up visit 3 months for continued evaluation and treatment.    Gardiner Barefoot DPM

## 2019-12-14 DIAGNOSIS — N186 End stage renal disease: Secondary | ICD-10-CM | POA: Diagnosis not present

## 2019-12-14 DIAGNOSIS — N2581 Secondary hyperparathyroidism of renal origin: Secondary | ICD-10-CM | POA: Diagnosis not present

## 2019-12-14 DIAGNOSIS — E1129 Type 2 diabetes mellitus with other diabetic kidney complication: Secondary | ICD-10-CM | POA: Diagnosis not present

## 2019-12-14 DIAGNOSIS — Z992 Dependence on renal dialysis: Secondary | ICD-10-CM | POA: Diagnosis not present

## 2019-12-14 DIAGNOSIS — D509 Iron deficiency anemia, unspecified: Secondary | ICD-10-CM | POA: Diagnosis not present

## 2019-12-16 DIAGNOSIS — D509 Iron deficiency anemia, unspecified: Secondary | ICD-10-CM | POA: Diagnosis not present

## 2019-12-16 DIAGNOSIS — N186 End stage renal disease: Secondary | ICD-10-CM | POA: Diagnosis not present

## 2019-12-16 DIAGNOSIS — E1129 Type 2 diabetes mellitus with other diabetic kidney complication: Secondary | ICD-10-CM | POA: Diagnosis not present

## 2019-12-16 DIAGNOSIS — Z992 Dependence on renal dialysis: Secondary | ICD-10-CM | POA: Diagnosis not present

## 2019-12-16 DIAGNOSIS — N2581 Secondary hyperparathyroidism of renal origin: Secondary | ICD-10-CM | POA: Diagnosis not present

## 2019-12-19 DIAGNOSIS — D509 Iron deficiency anemia, unspecified: Secondary | ICD-10-CM | POA: Diagnosis not present

## 2019-12-19 DIAGNOSIS — N2581 Secondary hyperparathyroidism of renal origin: Secondary | ICD-10-CM | POA: Diagnosis not present

## 2019-12-19 DIAGNOSIS — E1129 Type 2 diabetes mellitus with other diabetic kidney complication: Secondary | ICD-10-CM | POA: Diagnosis not present

## 2019-12-19 DIAGNOSIS — Z992 Dependence on renal dialysis: Secondary | ICD-10-CM | POA: Diagnosis not present

## 2019-12-19 DIAGNOSIS — N186 End stage renal disease: Secondary | ICD-10-CM | POA: Diagnosis not present

## 2019-12-21 DIAGNOSIS — Z992 Dependence on renal dialysis: Secondary | ICD-10-CM | POA: Diagnosis not present

## 2019-12-21 DIAGNOSIS — D509 Iron deficiency anemia, unspecified: Secondary | ICD-10-CM | POA: Diagnosis not present

## 2019-12-21 DIAGNOSIS — N2581 Secondary hyperparathyroidism of renal origin: Secondary | ICD-10-CM | POA: Diagnosis not present

## 2019-12-21 DIAGNOSIS — N186 End stage renal disease: Secondary | ICD-10-CM | POA: Diagnosis not present

## 2019-12-21 DIAGNOSIS — E1129 Type 2 diabetes mellitus with other diabetic kidney complication: Secondary | ICD-10-CM | POA: Diagnosis not present

## 2019-12-22 DIAGNOSIS — T782XXA Anaphylactic shock, unspecified, initial encounter: Secondary | ICD-10-CM | POA: Insufficient documentation

## 2019-12-22 DIAGNOSIS — T7840XA Allergy, unspecified, initial encounter: Secondary | ICD-10-CM | POA: Insufficient documentation

## 2019-12-23 DIAGNOSIS — Z992 Dependence on renal dialysis: Secondary | ICD-10-CM | POA: Diagnosis not present

## 2019-12-23 DIAGNOSIS — N2581 Secondary hyperparathyroidism of renal origin: Secondary | ICD-10-CM | POA: Diagnosis not present

## 2019-12-23 DIAGNOSIS — N186 End stage renal disease: Secondary | ICD-10-CM | POA: Diagnosis not present

## 2019-12-23 DIAGNOSIS — D509 Iron deficiency anemia, unspecified: Secondary | ICD-10-CM | POA: Diagnosis not present

## 2019-12-23 DIAGNOSIS — E1129 Type 2 diabetes mellitus with other diabetic kidney complication: Secondary | ICD-10-CM | POA: Diagnosis not present

## 2019-12-26 DIAGNOSIS — Z992 Dependence on renal dialysis: Secondary | ICD-10-CM | POA: Diagnosis not present

## 2019-12-26 DIAGNOSIS — E1129 Type 2 diabetes mellitus with other diabetic kidney complication: Secondary | ICD-10-CM | POA: Diagnosis not present

## 2019-12-26 DIAGNOSIS — N186 End stage renal disease: Secondary | ICD-10-CM | POA: Diagnosis not present

## 2019-12-26 DIAGNOSIS — N2581 Secondary hyperparathyroidism of renal origin: Secondary | ICD-10-CM | POA: Diagnosis not present

## 2019-12-26 DIAGNOSIS — D509 Iron deficiency anemia, unspecified: Secondary | ICD-10-CM | POA: Diagnosis not present

## 2019-12-28 DIAGNOSIS — N186 End stage renal disease: Secondary | ICD-10-CM | POA: Diagnosis not present

## 2019-12-28 DIAGNOSIS — Z992 Dependence on renal dialysis: Secondary | ICD-10-CM | POA: Diagnosis not present

## 2019-12-28 DIAGNOSIS — N2581 Secondary hyperparathyroidism of renal origin: Secondary | ICD-10-CM | POA: Diagnosis not present

## 2019-12-28 DIAGNOSIS — E1129 Type 2 diabetes mellitus with other diabetic kidney complication: Secondary | ICD-10-CM | POA: Diagnosis not present

## 2019-12-28 DIAGNOSIS — D509 Iron deficiency anemia, unspecified: Secondary | ICD-10-CM | POA: Diagnosis not present

## 2019-12-30 DIAGNOSIS — D509 Iron deficiency anemia, unspecified: Secondary | ICD-10-CM | POA: Diagnosis not present

## 2019-12-30 DIAGNOSIS — E1129 Type 2 diabetes mellitus with other diabetic kidney complication: Secondary | ICD-10-CM | POA: Diagnosis not present

## 2019-12-30 DIAGNOSIS — N186 End stage renal disease: Secondary | ICD-10-CM | POA: Diagnosis not present

## 2019-12-30 DIAGNOSIS — Z992 Dependence on renal dialysis: Secondary | ICD-10-CM | POA: Diagnosis not present

## 2019-12-30 DIAGNOSIS — N2581 Secondary hyperparathyroidism of renal origin: Secondary | ICD-10-CM | POA: Diagnosis not present

## 2019-12-31 DIAGNOSIS — Z992 Dependence on renal dialysis: Secondary | ICD-10-CM | POA: Diagnosis not present

## 2019-12-31 DIAGNOSIS — N186 End stage renal disease: Secondary | ICD-10-CM | POA: Diagnosis not present

## 2019-12-31 DIAGNOSIS — E1129 Type 2 diabetes mellitus with other diabetic kidney complication: Secondary | ICD-10-CM | POA: Diagnosis not present

## 2020-01-02 DIAGNOSIS — Z992 Dependence on renal dialysis: Secondary | ICD-10-CM | POA: Diagnosis not present

## 2020-01-02 DIAGNOSIS — N186 End stage renal disease: Secondary | ICD-10-CM | POA: Diagnosis not present

## 2020-01-02 DIAGNOSIS — E1129 Type 2 diabetes mellitus with other diabetic kidney complication: Secondary | ICD-10-CM | POA: Diagnosis not present

## 2020-01-02 DIAGNOSIS — N2581 Secondary hyperparathyroidism of renal origin: Secondary | ICD-10-CM | POA: Diagnosis not present

## 2020-01-04 DIAGNOSIS — Z992 Dependence on renal dialysis: Secondary | ICD-10-CM | POA: Diagnosis not present

## 2020-01-04 DIAGNOSIS — N2581 Secondary hyperparathyroidism of renal origin: Secondary | ICD-10-CM | POA: Diagnosis not present

## 2020-01-04 DIAGNOSIS — N186 End stage renal disease: Secondary | ICD-10-CM | POA: Diagnosis not present

## 2020-01-04 DIAGNOSIS — E1129 Type 2 diabetes mellitus with other diabetic kidney complication: Secondary | ICD-10-CM | POA: Diagnosis not present

## 2020-01-06 DIAGNOSIS — Z992 Dependence on renal dialysis: Secondary | ICD-10-CM | POA: Diagnosis not present

## 2020-01-06 DIAGNOSIS — N186 End stage renal disease: Secondary | ICD-10-CM | POA: Diagnosis not present

## 2020-01-06 DIAGNOSIS — N2581 Secondary hyperparathyroidism of renal origin: Secondary | ICD-10-CM | POA: Diagnosis not present

## 2020-01-06 DIAGNOSIS — E1129 Type 2 diabetes mellitus with other diabetic kidney complication: Secondary | ICD-10-CM | POA: Diagnosis not present

## 2020-01-09 DIAGNOSIS — N186 End stage renal disease: Secondary | ICD-10-CM | POA: Diagnosis not present

## 2020-01-09 DIAGNOSIS — Z992 Dependence on renal dialysis: Secondary | ICD-10-CM | POA: Diagnosis not present

## 2020-01-09 DIAGNOSIS — N2581 Secondary hyperparathyroidism of renal origin: Secondary | ICD-10-CM | POA: Diagnosis not present

## 2020-01-09 DIAGNOSIS — E1129 Type 2 diabetes mellitus with other diabetic kidney complication: Secondary | ICD-10-CM | POA: Diagnosis not present

## 2020-01-10 ENCOUNTER — Encounter (INDEPENDENT_AMBULATORY_CARE_PROVIDER_SITE_OTHER): Payer: Medicare Other | Admitting: Ophthalmology

## 2020-01-10 ENCOUNTER — Other Ambulatory Visit: Payer: Self-pay

## 2020-01-10 DIAGNOSIS — I10 Essential (primary) hypertension: Secondary | ICD-10-CM | POA: Diagnosis not present

## 2020-01-10 DIAGNOSIS — E11319 Type 2 diabetes mellitus with unspecified diabetic retinopathy without macular edema: Secondary | ICD-10-CM | POA: Diagnosis not present

## 2020-01-10 DIAGNOSIS — H35033 Hypertensive retinopathy, bilateral: Secondary | ICD-10-CM

## 2020-01-10 DIAGNOSIS — E113593 Type 2 diabetes mellitus with proliferative diabetic retinopathy without macular edema, bilateral: Secondary | ICD-10-CM | POA: Diagnosis not present

## 2020-01-11 DIAGNOSIS — N2581 Secondary hyperparathyroidism of renal origin: Secondary | ICD-10-CM | POA: Diagnosis not present

## 2020-01-11 DIAGNOSIS — E1129 Type 2 diabetes mellitus with other diabetic kidney complication: Secondary | ICD-10-CM | POA: Diagnosis not present

## 2020-01-11 DIAGNOSIS — Z992 Dependence on renal dialysis: Secondary | ICD-10-CM | POA: Diagnosis not present

## 2020-01-11 DIAGNOSIS — N186 End stage renal disease: Secondary | ICD-10-CM | POA: Diagnosis not present

## 2020-01-13 DIAGNOSIS — N2581 Secondary hyperparathyroidism of renal origin: Secondary | ICD-10-CM | POA: Diagnosis not present

## 2020-01-13 DIAGNOSIS — Z992 Dependence on renal dialysis: Secondary | ICD-10-CM | POA: Diagnosis not present

## 2020-01-13 DIAGNOSIS — E1129 Type 2 diabetes mellitus with other diabetic kidney complication: Secondary | ICD-10-CM | POA: Diagnosis not present

## 2020-01-13 DIAGNOSIS — N186 End stage renal disease: Secondary | ICD-10-CM | POA: Diagnosis not present

## 2020-01-16 DIAGNOSIS — N186 End stage renal disease: Secondary | ICD-10-CM | POA: Diagnosis not present

## 2020-01-16 DIAGNOSIS — E1129 Type 2 diabetes mellitus with other diabetic kidney complication: Secondary | ICD-10-CM | POA: Diagnosis not present

## 2020-01-16 DIAGNOSIS — Z992 Dependence on renal dialysis: Secondary | ICD-10-CM | POA: Diagnosis not present

## 2020-01-16 DIAGNOSIS — N2581 Secondary hyperparathyroidism of renal origin: Secondary | ICD-10-CM | POA: Diagnosis not present

## 2020-01-17 DIAGNOSIS — D6869 Other thrombophilia: Secondary | ICD-10-CM | POA: Diagnosis not present

## 2020-01-17 DIAGNOSIS — I4891 Unspecified atrial fibrillation: Secondary | ICD-10-CM | POA: Diagnosis not present

## 2020-01-17 DIAGNOSIS — Z7901 Long term (current) use of anticoagulants: Secondary | ICD-10-CM | POA: Diagnosis not present

## 2020-01-18 DIAGNOSIS — N186 End stage renal disease: Secondary | ICD-10-CM | POA: Diagnosis not present

## 2020-01-18 DIAGNOSIS — E1129 Type 2 diabetes mellitus with other diabetic kidney complication: Secondary | ICD-10-CM | POA: Diagnosis not present

## 2020-01-18 DIAGNOSIS — Z992 Dependence on renal dialysis: Secondary | ICD-10-CM | POA: Diagnosis not present

## 2020-01-18 DIAGNOSIS — N2581 Secondary hyperparathyroidism of renal origin: Secondary | ICD-10-CM | POA: Diagnosis not present

## 2020-01-20 DIAGNOSIS — N2581 Secondary hyperparathyroidism of renal origin: Secondary | ICD-10-CM | POA: Diagnosis not present

## 2020-01-20 DIAGNOSIS — E1129 Type 2 diabetes mellitus with other diabetic kidney complication: Secondary | ICD-10-CM | POA: Diagnosis not present

## 2020-01-20 DIAGNOSIS — Z992 Dependence on renal dialysis: Secondary | ICD-10-CM | POA: Diagnosis not present

## 2020-01-20 DIAGNOSIS — N186 End stage renal disease: Secondary | ICD-10-CM | POA: Diagnosis not present

## 2020-01-23 DIAGNOSIS — Z992 Dependence on renal dialysis: Secondary | ICD-10-CM | POA: Diagnosis not present

## 2020-01-23 DIAGNOSIS — N186 End stage renal disease: Secondary | ICD-10-CM | POA: Diagnosis not present

## 2020-01-23 DIAGNOSIS — E1129 Type 2 diabetes mellitus with other diabetic kidney complication: Secondary | ICD-10-CM | POA: Diagnosis not present

## 2020-01-23 DIAGNOSIS — N2581 Secondary hyperparathyroidism of renal origin: Secondary | ICD-10-CM | POA: Diagnosis not present

## 2020-01-25 DIAGNOSIS — E1129 Type 2 diabetes mellitus with other diabetic kidney complication: Secondary | ICD-10-CM | POA: Diagnosis not present

## 2020-01-25 DIAGNOSIS — N186 End stage renal disease: Secondary | ICD-10-CM | POA: Diagnosis not present

## 2020-01-25 DIAGNOSIS — Z992 Dependence on renal dialysis: Secondary | ICD-10-CM | POA: Diagnosis not present

## 2020-01-25 DIAGNOSIS — N2581 Secondary hyperparathyroidism of renal origin: Secondary | ICD-10-CM | POA: Diagnosis not present

## 2020-01-27 DIAGNOSIS — N186 End stage renal disease: Secondary | ICD-10-CM | POA: Diagnosis not present

## 2020-01-27 DIAGNOSIS — Z992 Dependence on renal dialysis: Secondary | ICD-10-CM | POA: Diagnosis not present

## 2020-01-27 DIAGNOSIS — E1129 Type 2 diabetes mellitus with other diabetic kidney complication: Secondary | ICD-10-CM | POA: Diagnosis not present

## 2020-01-27 DIAGNOSIS — N2581 Secondary hyperparathyroidism of renal origin: Secondary | ICD-10-CM | POA: Diagnosis not present

## 2020-01-30 DIAGNOSIS — Z992 Dependence on renal dialysis: Secondary | ICD-10-CM | POA: Diagnosis not present

## 2020-01-30 DIAGNOSIS — N186 End stage renal disease: Secondary | ICD-10-CM | POA: Diagnosis not present

## 2020-01-30 DIAGNOSIS — E1129 Type 2 diabetes mellitus with other diabetic kidney complication: Secondary | ICD-10-CM | POA: Diagnosis not present

## 2020-01-30 DIAGNOSIS — N2581 Secondary hyperparathyroidism of renal origin: Secondary | ICD-10-CM | POA: Diagnosis not present

## 2020-01-31 DIAGNOSIS — E785 Hyperlipidemia, unspecified: Secondary | ICD-10-CM | POA: Diagnosis not present

## 2020-01-31 DIAGNOSIS — Z125 Encounter for screening for malignant neoplasm of prostate: Secondary | ICD-10-CM | POA: Diagnosis not present

## 2020-01-31 DIAGNOSIS — Z992 Dependence on renal dialysis: Secondary | ICD-10-CM | POA: Diagnosis not present

## 2020-01-31 DIAGNOSIS — N186 End stage renal disease: Secondary | ICD-10-CM | POA: Diagnosis not present

## 2020-01-31 DIAGNOSIS — E1129 Type 2 diabetes mellitus with other diabetic kidney complication: Secondary | ICD-10-CM | POA: Diagnosis not present

## 2020-02-01 DIAGNOSIS — N186 End stage renal disease: Secondary | ICD-10-CM | POA: Diagnosis not present

## 2020-02-01 DIAGNOSIS — Z992 Dependence on renal dialysis: Secondary | ICD-10-CM | POA: Diagnosis not present

## 2020-02-01 DIAGNOSIS — Z23 Encounter for immunization: Secondary | ICD-10-CM | POA: Diagnosis not present

## 2020-02-01 DIAGNOSIS — E1129 Type 2 diabetes mellitus with other diabetic kidney complication: Secondary | ICD-10-CM | POA: Diagnosis not present

## 2020-02-01 DIAGNOSIS — D509 Iron deficiency anemia, unspecified: Secondary | ICD-10-CM | POA: Diagnosis not present

## 2020-02-01 DIAGNOSIS — N2581 Secondary hyperparathyroidism of renal origin: Secondary | ICD-10-CM | POA: Diagnosis not present

## 2020-02-02 DIAGNOSIS — E1129 Type 2 diabetes mellitus with other diabetic kidney complication: Secondary | ICD-10-CM | POA: Diagnosis not present

## 2020-02-02 DIAGNOSIS — Z23 Encounter for immunization: Secondary | ICD-10-CM | POA: Diagnosis not present

## 2020-02-02 DIAGNOSIS — N186 End stage renal disease: Secondary | ICD-10-CM | POA: Diagnosis not present

## 2020-02-02 DIAGNOSIS — Z992 Dependence on renal dialysis: Secondary | ICD-10-CM | POA: Diagnosis not present

## 2020-02-02 DIAGNOSIS — N2581 Secondary hyperparathyroidism of renal origin: Secondary | ICD-10-CM | POA: Diagnosis not present

## 2020-02-02 DIAGNOSIS — D509 Iron deficiency anemia, unspecified: Secondary | ICD-10-CM | POA: Diagnosis not present

## 2020-02-06 DIAGNOSIS — Z992 Dependence on renal dialysis: Secondary | ICD-10-CM | POA: Diagnosis not present

## 2020-02-06 DIAGNOSIS — Z23 Encounter for immunization: Secondary | ICD-10-CM | POA: Diagnosis not present

## 2020-02-06 DIAGNOSIS — D509 Iron deficiency anemia, unspecified: Secondary | ICD-10-CM | POA: Diagnosis not present

## 2020-02-06 DIAGNOSIS — N2581 Secondary hyperparathyroidism of renal origin: Secondary | ICD-10-CM | POA: Diagnosis not present

## 2020-02-06 DIAGNOSIS — N186 End stage renal disease: Secondary | ICD-10-CM | POA: Diagnosis not present

## 2020-02-06 DIAGNOSIS — E1129 Type 2 diabetes mellitus with other diabetic kidney complication: Secondary | ICD-10-CM | POA: Diagnosis not present

## 2020-02-07 DIAGNOSIS — I130 Hypertensive heart and chronic kidney disease with heart failure and stage 1 through stage 4 chronic kidney disease, or unspecified chronic kidney disease: Secondary | ICD-10-CM | POA: Diagnosis not present

## 2020-02-07 DIAGNOSIS — N186 End stage renal disease: Secondary | ICD-10-CM | POA: Diagnosis not present

## 2020-02-07 DIAGNOSIS — I5022 Chronic systolic (congestive) heart failure: Secondary | ICD-10-CM | POA: Diagnosis not present

## 2020-02-07 DIAGNOSIS — Z992 Dependence on renal dialysis: Secondary | ICD-10-CM | POA: Diagnosis not present

## 2020-02-07 DIAGNOSIS — D638 Anemia in other chronic diseases classified elsewhere: Secondary | ICD-10-CM | POA: Diagnosis not present

## 2020-02-07 DIAGNOSIS — E1139 Type 2 diabetes mellitus with other diabetic ophthalmic complication: Secondary | ICD-10-CM | POA: Diagnosis not present

## 2020-02-07 DIAGNOSIS — E1129 Type 2 diabetes mellitus with other diabetic kidney complication: Secondary | ICD-10-CM | POA: Diagnosis not present

## 2020-02-07 DIAGNOSIS — Z0000 Encounter for general adult medical examination without abnormal findings: Secondary | ICD-10-CM | POA: Diagnosis not present

## 2020-02-08 DIAGNOSIS — D509 Iron deficiency anemia, unspecified: Secondary | ICD-10-CM | POA: Diagnosis not present

## 2020-02-08 DIAGNOSIS — N186 End stage renal disease: Secondary | ICD-10-CM | POA: Diagnosis not present

## 2020-02-08 DIAGNOSIS — N2581 Secondary hyperparathyroidism of renal origin: Secondary | ICD-10-CM | POA: Diagnosis not present

## 2020-02-08 DIAGNOSIS — E1129 Type 2 diabetes mellitus with other diabetic kidney complication: Secondary | ICD-10-CM | POA: Diagnosis not present

## 2020-02-08 DIAGNOSIS — Z992 Dependence on renal dialysis: Secondary | ICD-10-CM | POA: Diagnosis not present

## 2020-02-08 DIAGNOSIS — Z23 Encounter for immunization: Secondary | ICD-10-CM | POA: Diagnosis not present

## 2020-02-10 DIAGNOSIS — E1129 Type 2 diabetes mellitus with other diabetic kidney complication: Secondary | ICD-10-CM | POA: Diagnosis not present

## 2020-02-10 DIAGNOSIS — N2581 Secondary hyperparathyroidism of renal origin: Secondary | ICD-10-CM | POA: Diagnosis not present

## 2020-02-10 DIAGNOSIS — Z23 Encounter for immunization: Secondary | ICD-10-CM | POA: Diagnosis not present

## 2020-02-10 DIAGNOSIS — D509 Iron deficiency anemia, unspecified: Secondary | ICD-10-CM | POA: Diagnosis not present

## 2020-02-10 DIAGNOSIS — Z992 Dependence on renal dialysis: Secondary | ICD-10-CM | POA: Diagnosis not present

## 2020-02-10 DIAGNOSIS — N186 End stage renal disease: Secondary | ICD-10-CM | POA: Diagnosis not present

## 2020-02-13 DIAGNOSIS — Z992 Dependence on renal dialysis: Secondary | ICD-10-CM | POA: Diagnosis not present

## 2020-02-13 DIAGNOSIS — N186 End stage renal disease: Secondary | ICD-10-CM | POA: Diagnosis not present

## 2020-02-13 DIAGNOSIS — Z23 Encounter for immunization: Secondary | ICD-10-CM | POA: Diagnosis not present

## 2020-02-13 DIAGNOSIS — E1129 Type 2 diabetes mellitus with other diabetic kidney complication: Secondary | ICD-10-CM | POA: Diagnosis not present

## 2020-02-13 DIAGNOSIS — D509 Iron deficiency anemia, unspecified: Secondary | ICD-10-CM | POA: Diagnosis not present

## 2020-02-13 DIAGNOSIS — N2581 Secondary hyperparathyroidism of renal origin: Secondary | ICD-10-CM | POA: Diagnosis not present

## 2020-02-15 DIAGNOSIS — E1129 Type 2 diabetes mellitus with other diabetic kidney complication: Secondary | ICD-10-CM | POA: Diagnosis not present

## 2020-02-15 DIAGNOSIS — D509 Iron deficiency anemia, unspecified: Secondary | ICD-10-CM | POA: Diagnosis not present

## 2020-02-15 DIAGNOSIS — Z992 Dependence on renal dialysis: Secondary | ICD-10-CM | POA: Diagnosis not present

## 2020-02-15 DIAGNOSIS — N186 End stage renal disease: Secondary | ICD-10-CM | POA: Diagnosis not present

## 2020-02-15 DIAGNOSIS — N2581 Secondary hyperparathyroidism of renal origin: Secondary | ICD-10-CM | POA: Diagnosis not present

## 2020-02-15 DIAGNOSIS — Z23 Encounter for immunization: Secondary | ICD-10-CM | POA: Diagnosis not present

## 2020-02-17 DIAGNOSIS — D509 Iron deficiency anemia, unspecified: Secondary | ICD-10-CM | POA: Diagnosis not present

## 2020-02-17 DIAGNOSIS — E1129 Type 2 diabetes mellitus with other diabetic kidney complication: Secondary | ICD-10-CM | POA: Diagnosis not present

## 2020-02-17 DIAGNOSIS — N2581 Secondary hyperparathyroidism of renal origin: Secondary | ICD-10-CM | POA: Diagnosis not present

## 2020-02-17 DIAGNOSIS — Z992 Dependence on renal dialysis: Secondary | ICD-10-CM | POA: Diagnosis not present

## 2020-02-17 DIAGNOSIS — Z23 Encounter for immunization: Secondary | ICD-10-CM | POA: Diagnosis not present

## 2020-02-17 DIAGNOSIS — N186 End stage renal disease: Secondary | ICD-10-CM | POA: Diagnosis not present

## 2020-02-20 DIAGNOSIS — E1129 Type 2 diabetes mellitus with other diabetic kidney complication: Secondary | ICD-10-CM | POA: Diagnosis not present

## 2020-02-20 DIAGNOSIS — N2581 Secondary hyperparathyroidism of renal origin: Secondary | ICD-10-CM | POA: Diagnosis not present

## 2020-02-20 DIAGNOSIS — Z23 Encounter for immunization: Secondary | ICD-10-CM | POA: Diagnosis not present

## 2020-02-20 DIAGNOSIS — Z992 Dependence on renal dialysis: Secondary | ICD-10-CM | POA: Diagnosis not present

## 2020-02-20 DIAGNOSIS — D509 Iron deficiency anemia, unspecified: Secondary | ICD-10-CM | POA: Diagnosis not present

## 2020-02-20 DIAGNOSIS — N186 End stage renal disease: Secondary | ICD-10-CM | POA: Diagnosis not present

## 2020-02-21 DIAGNOSIS — D6869 Other thrombophilia: Secondary | ICD-10-CM | POA: Diagnosis not present

## 2020-02-21 DIAGNOSIS — I4891 Unspecified atrial fibrillation: Secondary | ICD-10-CM | POA: Diagnosis not present

## 2020-02-21 DIAGNOSIS — Z7901 Long term (current) use of anticoagulants: Secondary | ICD-10-CM | POA: Diagnosis not present

## 2020-02-22 DIAGNOSIS — Z992 Dependence on renal dialysis: Secondary | ICD-10-CM | POA: Diagnosis not present

## 2020-02-22 DIAGNOSIS — D509 Iron deficiency anemia, unspecified: Secondary | ICD-10-CM | POA: Diagnosis not present

## 2020-02-22 DIAGNOSIS — N186 End stage renal disease: Secondary | ICD-10-CM | POA: Diagnosis not present

## 2020-02-22 DIAGNOSIS — N2581 Secondary hyperparathyroidism of renal origin: Secondary | ICD-10-CM | POA: Diagnosis not present

## 2020-02-22 DIAGNOSIS — Z23 Encounter for immunization: Secondary | ICD-10-CM | POA: Diagnosis not present

## 2020-02-22 DIAGNOSIS — E1129 Type 2 diabetes mellitus with other diabetic kidney complication: Secondary | ICD-10-CM | POA: Diagnosis not present

## 2020-02-24 DIAGNOSIS — Z992 Dependence on renal dialysis: Secondary | ICD-10-CM | POA: Diagnosis not present

## 2020-02-24 DIAGNOSIS — Z23 Encounter for immunization: Secondary | ICD-10-CM | POA: Diagnosis not present

## 2020-02-24 DIAGNOSIS — N186 End stage renal disease: Secondary | ICD-10-CM | POA: Diagnosis not present

## 2020-02-24 DIAGNOSIS — E1129 Type 2 diabetes mellitus with other diabetic kidney complication: Secondary | ICD-10-CM | POA: Diagnosis not present

## 2020-02-24 DIAGNOSIS — D509 Iron deficiency anemia, unspecified: Secondary | ICD-10-CM | POA: Diagnosis not present

## 2020-02-24 DIAGNOSIS — N2581 Secondary hyperparathyroidism of renal origin: Secondary | ICD-10-CM | POA: Diagnosis not present

## 2020-02-27 DIAGNOSIS — Z992 Dependence on renal dialysis: Secondary | ICD-10-CM | POA: Diagnosis not present

## 2020-02-27 DIAGNOSIS — D509 Iron deficiency anemia, unspecified: Secondary | ICD-10-CM | POA: Diagnosis not present

## 2020-02-27 DIAGNOSIS — N186 End stage renal disease: Secondary | ICD-10-CM | POA: Diagnosis not present

## 2020-02-27 DIAGNOSIS — Z23 Encounter for immunization: Secondary | ICD-10-CM | POA: Diagnosis not present

## 2020-02-27 DIAGNOSIS — N2581 Secondary hyperparathyroidism of renal origin: Secondary | ICD-10-CM | POA: Diagnosis not present

## 2020-02-27 DIAGNOSIS — E1129 Type 2 diabetes mellitus with other diabetic kidney complication: Secondary | ICD-10-CM | POA: Diagnosis not present

## 2020-02-29 DIAGNOSIS — D509 Iron deficiency anemia, unspecified: Secondary | ICD-10-CM | POA: Diagnosis not present

## 2020-02-29 DIAGNOSIS — Z992 Dependence on renal dialysis: Secondary | ICD-10-CM | POA: Diagnosis not present

## 2020-02-29 DIAGNOSIS — Z23 Encounter for immunization: Secondary | ICD-10-CM | POA: Diagnosis not present

## 2020-02-29 DIAGNOSIS — N186 End stage renal disease: Secondary | ICD-10-CM | POA: Diagnosis not present

## 2020-02-29 DIAGNOSIS — N2581 Secondary hyperparathyroidism of renal origin: Secondary | ICD-10-CM | POA: Diagnosis not present

## 2020-02-29 DIAGNOSIS — E1129 Type 2 diabetes mellitus with other diabetic kidney complication: Secondary | ICD-10-CM | POA: Diagnosis not present

## 2020-03-01 DIAGNOSIS — E1129 Type 2 diabetes mellitus with other diabetic kidney complication: Secondary | ICD-10-CM | POA: Diagnosis not present

## 2020-03-01 DIAGNOSIS — Z992 Dependence on renal dialysis: Secondary | ICD-10-CM | POA: Diagnosis not present

## 2020-03-01 DIAGNOSIS — N186 End stage renal disease: Secondary | ICD-10-CM | POA: Diagnosis not present

## 2020-03-02 DIAGNOSIS — N186 End stage renal disease: Secondary | ICD-10-CM | POA: Diagnosis not present

## 2020-03-02 DIAGNOSIS — Z23 Encounter for immunization: Secondary | ICD-10-CM | POA: Diagnosis not present

## 2020-03-02 DIAGNOSIS — D509 Iron deficiency anemia, unspecified: Secondary | ICD-10-CM | POA: Diagnosis not present

## 2020-03-02 DIAGNOSIS — Z992 Dependence on renal dialysis: Secondary | ICD-10-CM | POA: Diagnosis not present

## 2020-03-02 DIAGNOSIS — N2581 Secondary hyperparathyroidism of renal origin: Secondary | ICD-10-CM | POA: Diagnosis not present

## 2020-03-02 DIAGNOSIS — E1129 Type 2 diabetes mellitus with other diabetic kidney complication: Secondary | ICD-10-CM | POA: Diagnosis not present

## 2020-03-05 DIAGNOSIS — E1129 Type 2 diabetes mellitus with other diabetic kidney complication: Secondary | ICD-10-CM | POA: Diagnosis not present

## 2020-03-05 DIAGNOSIS — D509 Iron deficiency anemia, unspecified: Secondary | ICD-10-CM | POA: Diagnosis not present

## 2020-03-05 DIAGNOSIS — N2581 Secondary hyperparathyroidism of renal origin: Secondary | ICD-10-CM | POA: Diagnosis not present

## 2020-03-05 DIAGNOSIS — Z992 Dependence on renal dialysis: Secondary | ICD-10-CM | POA: Diagnosis not present

## 2020-03-05 DIAGNOSIS — N186 End stage renal disease: Secondary | ICD-10-CM | POA: Diagnosis not present

## 2020-03-05 DIAGNOSIS — Z23 Encounter for immunization: Secondary | ICD-10-CM | POA: Diagnosis not present

## 2020-03-07 DIAGNOSIS — Z23 Encounter for immunization: Secondary | ICD-10-CM | POA: Diagnosis not present

## 2020-03-07 DIAGNOSIS — N2581 Secondary hyperparathyroidism of renal origin: Secondary | ICD-10-CM | POA: Diagnosis not present

## 2020-03-07 DIAGNOSIS — Z992 Dependence on renal dialysis: Secondary | ICD-10-CM | POA: Diagnosis not present

## 2020-03-07 DIAGNOSIS — D509 Iron deficiency anemia, unspecified: Secondary | ICD-10-CM | POA: Diagnosis not present

## 2020-03-07 DIAGNOSIS — N186 End stage renal disease: Secondary | ICD-10-CM | POA: Diagnosis not present

## 2020-03-07 DIAGNOSIS — E1129 Type 2 diabetes mellitus with other diabetic kidney complication: Secondary | ICD-10-CM | POA: Diagnosis not present

## 2020-03-09 DIAGNOSIS — N2581 Secondary hyperparathyroidism of renal origin: Secondary | ICD-10-CM | POA: Diagnosis not present

## 2020-03-09 DIAGNOSIS — Z992 Dependence on renal dialysis: Secondary | ICD-10-CM | POA: Diagnosis not present

## 2020-03-09 DIAGNOSIS — Z23 Encounter for immunization: Secondary | ICD-10-CM | POA: Diagnosis not present

## 2020-03-09 DIAGNOSIS — D509 Iron deficiency anemia, unspecified: Secondary | ICD-10-CM | POA: Diagnosis not present

## 2020-03-09 DIAGNOSIS — N186 End stage renal disease: Secondary | ICD-10-CM | POA: Diagnosis not present

## 2020-03-09 DIAGNOSIS — E1129 Type 2 diabetes mellitus with other diabetic kidney complication: Secondary | ICD-10-CM | POA: Diagnosis not present

## 2020-03-12 ENCOUNTER — Other Ambulatory Visit (HOSPITAL_COMMUNITY): Payer: Self-pay | Admitting: Nephrology

## 2020-03-12 ENCOUNTER — Other Ambulatory Visit: Payer: Self-pay | Admitting: Pediatrics

## 2020-03-12 ENCOUNTER — Other Ambulatory Visit: Payer: Self-pay | Admitting: Nephrology

## 2020-03-12 DIAGNOSIS — N2581 Secondary hyperparathyroidism of renal origin: Secondary | ICD-10-CM | POA: Diagnosis not present

## 2020-03-12 DIAGNOSIS — R109 Unspecified abdominal pain: Secondary | ICD-10-CM

## 2020-03-12 DIAGNOSIS — D509 Iron deficiency anemia, unspecified: Secondary | ICD-10-CM | POA: Diagnosis not present

## 2020-03-12 DIAGNOSIS — Z23 Encounter for immunization: Secondary | ICD-10-CM | POA: Diagnosis not present

## 2020-03-12 DIAGNOSIS — N186 End stage renal disease: Secondary | ICD-10-CM | POA: Diagnosis not present

## 2020-03-12 DIAGNOSIS — Z992 Dependence on renal dialysis: Secondary | ICD-10-CM | POA: Diagnosis not present

## 2020-03-12 DIAGNOSIS — E1129 Type 2 diabetes mellitus with other diabetic kidney complication: Secondary | ICD-10-CM | POA: Diagnosis not present

## 2020-03-14 DIAGNOSIS — D509 Iron deficiency anemia, unspecified: Secondary | ICD-10-CM | POA: Diagnosis not present

## 2020-03-14 DIAGNOSIS — Z23 Encounter for immunization: Secondary | ICD-10-CM | POA: Diagnosis not present

## 2020-03-14 DIAGNOSIS — E1129 Type 2 diabetes mellitus with other diabetic kidney complication: Secondary | ICD-10-CM | POA: Diagnosis not present

## 2020-03-14 DIAGNOSIS — N186 End stage renal disease: Secondary | ICD-10-CM | POA: Diagnosis not present

## 2020-03-14 DIAGNOSIS — N2581 Secondary hyperparathyroidism of renal origin: Secondary | ICD-10-CM | POA: Diagnosis not present

## 2020-03-14 DIAGNOSIS — Z992 Dependence on renal dialysis: Secondary | ICD-10-CM | POA: Diagnosis not present

## 2020-03-16 DIAGNOSIS — E1129 Type 2 diabetes mellitus with other diabetic kidney complication: Secondary | ICD-10-CM | POA: Diagnosis not present

## 2020-03-16 DIAGNOSIS — Z23 Encounter for immunization: Secondary | ICD-10-CM | POA: Diagnosis not present

## 2020-03-16 DIAGNOSIS — D509 Iron deficiency anemia, unspecified: Secondary | ICD-10-CM | POA: Diagnosis not present

## 2020-03-16 DIAGNOSIS — N2581 Secondary hyperparathyroidism of renal origin: Secondary | ICD-10-CM | POA: Diagnosis not present

## 2020-03-16 DIAGNOSIS — Z992 Dependence on renal dialysis: Secondary | ICD-10-CM | POA: Diagnosis not present

## 2020-03-16 DIAGNOSIS — N186 End stage renal disease: Secondary | ICD-10-CM | POA: Diagnosis not present

## 2020-03-18 ENCOUNTER — Other Ambulatory Visit: Payer: Self-pay

## 2020-03-18 ENCOUNTER — Ambulatory Visit (HOSPITAL_COMMUNITY)
Admission: RE | Admit: 2020-03-18 | Discharge: 2020-03-18 | Disposition: A | Discharge status: Home / Self Care | Payer: Medicare Other | Source: Ambulatory Visit | Attending: Nephrology | Admitting: Nephrology

## 2020-03-18 DIAGNOSIS — N1330 Unspecified hydronephrosis: Secondary | ICD-10-CM | POA: Diagnosis not present

## 2020-03-18 DIAGNOSIS — R109 Unspecified abdominal pain: Secondary | ICD-10-CM | POA: Insufficient documentation

## 2020-03-19 DIAGNOSIS — N2581 Secondary hyperparathyroidism of renal origin: Secondary | ICD-10-CM | POA: Diagnosis not present

## 2020-03-19 DIAGNOSIS — Z23 Encounter for immunization: Secondary | ICD-10-CM | POA: Diagnosis not present

## 2020-03-19 DIAGNOSIS — D509 Iron deficiency anemia, unspecified: Secondary | ICD-10-CM | POA: Diagnosis not present

## 2020-03-19 DIAGNOSIS — E1129 Type 2 diabetes mellitus with other diabetic kidney complication: Secondary | ICD-10-CM | POA: Diagnosis not present

## 2020-03-19 DIAGNOSIS — N186 End stage renal disease: Secondary | ICD-10-CM | POA: Diagnosis not present

## 2020-03-19 DIAGNOSIS — Z992 Dependence on renal dialysis: Secondary | ICD-10-CM | POA: Diagnosis not present

## 2020-03-20 ENCOUNTER — Ambulatory Visit: Payer: Medicare Other | Admitting: Podiatry

## 2020-03-20 DIAGNOSIS — D6869 Other thrombophilia: Secondary | ICD-10-CM | POA: Diagnosis not present

## 2020-03-20 DIAGNOSIS — I4891 Unspecified atrial fibrillation: Secondary | ICD-10-CM | POA: Diagnosis not present

## 2020-03-20 DIAGNOSIS — Z7901 Long term (current) use of anticoagulants: Secondary | ICD-10-CM | POA: Diagnosis not present

## 2020-03-21 DIAGNOSIS — N2581 Secondary hyperparathyroidism of renal origin: Secondary | ICD-10-CM | POA: Diagnosis not present

## 2020-03-21 DIAGNOSIS — E039 Hypothyroidism, unspecified: Secondary | ICD-10-CM | POA: Diagnosis not present

## 2020-03-21 DIAGNOSIS — E1129 Type 2 diabetes mellitus with other diabetic kidney complication: Secondary | ICD-10-CM | POA: Diagnosis not present

## 2020-03-21 DIAGNOSIS — Z992 Dependence on renal dialysis: Secondary | ICD-10-CM | POA: Diagnosis not present

## 2020-03-21 DIAGNOSIS — D509 Iron deficiency anemia, unspecified: Secondary | ICD-10-CM | POA: Diagnosis not present

## 2020-03-21 DIAGNOSIS — N186 End stage renal disease: Secondary | ICD-10-CM | POA: Diagnosis not present

## 2020-03-21 DIAGNOSIS — Z23 Encounter for immunization: Secondary | ICD-10-CM | POA: Diagnosis not present

## 2020-03-22 DIAGNOSIS — Z8249 Family history of ischemic heart disease and other diseases of the circulatory system: Secondary | ICD-10-CM | POA: Diagnosis not present

## 2020-03-22 DIAGNOSIS — I10 Essential (primary) hypertension: Secondary | ICD-10-CM | POA: Diagnosis not present

## 2020-03-22 DIAGNOSIS — I4891 Unspecified atrial fibrillation: Secondary | ICD-10-CM | POA: Diagnosis not present

## 2020-03-22 DIAGNOSIS — Z1379 Encounter for other screening for genetic and chromosomal anomalies: Secondary | ICD-10-CM | POA: Diagnosis not present

## 2020-03-23 DIAGNOSIS — N186 End stage renal disease: Secondary | ICD-10-CM | POA: Diagnosis not present

## 2020-03-23 DIAGNOSIS — D509 Iron deficiency anemia, unspecified: Secondary | ICD-10-CM | POA: Diagnosis not present

## 2020-03-23 DIAGNOSIS — Z23 Encounter for immunization: Secondary | ICD-10-CM | POA: Diagnosis not present

## 2020-03-23 DIAGNOSIS — E1129 Type 2 diabetes mellitus with other diabetic kidney complication: Secondary | ICD-10-CM | POA: Diagnosis not present

## 2020-03-23 DIAGNOSIS — N2581 Secondary hyperparathyroidism of renal origin: Secondary | ICD-10-CM | POA: Diagnosis not present

## 2020-03-23 DIAGNOSIS — Z992 Dependence on renal dialysis: Secondary | ICD-10-CM | POA: Diagnosis not present

## 2020-03-25 DIAGNOSIS — E039 Hypothyroidism, unspecified: Secondary | ICD-10-CM | POA: Insufficient documentation

## 2020-03-26 DIAGNOSIS — Z992 Dependence on renal dialysis: Secondary | ICD-10-CM | POA: Diagnosis not present

## 2020-03-26 DIAGNOSIS — N186 End stage renal disease: Secondary | ICD-10-CM | POA: Diagnosis not present

## 2020-03-26 DIAGNOSIS — N2581 Secondary hyperparathyroidism of renal origin: Secondary | ICD-10-CM | POA: Diagnosis not present

## 2020-03-26 DIAGNOSIS — Z23 Encounter for immunization: Secondary | ICD-10-CM | POA: Diagnosis not present

## 2020-03-26 DIAGNOSIS — E1129 Type 2 diabetes mellitus with other diabetic kidney complication: Secondary | ICD-10-CM | POA: Diagnosis not present

## 2020-03-26 DIAGNOSIS — D509 Iron deficiency anemia, unspecified: Secondary | ICD-10-CM | POA: Diagnosis not present

## 2020-03-27 ENCOUNTER — Other Ambulatory Visit: Payer: Self-pay

## 2020-03-27 ENCOUNTER — Ambulatory Visit (INDEPENDENT_AMBULATORY_CARE_PROVIDER_SITE_OTHER): Payer: Medicare Other | Admitting: Podiatry

## 2020-03-27 ENCOUNTER — Encounter: Payer: Self-pay | Admitting: Podiatry

## 2020-03-27 DIAGNOSIS — E0842 Diabetes mellitus due to underlying condition with diabetic polyneuropathy: Secondary | ICD-10-CM | POA: Diagnosis not present

## 2020-03-27 DIAGNOSIS — I739 Peripheral vascular disease, unspecified: Secondary | ICD-10-CM

## 2020-03-27 DIAGNOSIS — Z89431 Acquired absence of right foot: Secondary | ICD-10-CM

## 2020-03-27 DIAGNOSIS — B351 Tinea unguium: Secondary | ICD-10-CM

## 2020-03-27 DIAGNOSIS — M79676 Pain in unspecified toe(s): Secondary | ICD-10-CM | POA: Diagnosis not present

## 2020-03-27 NOTE — Progress Notes (Signed)
Complaint:  Visit Type: Patient returns to my office for continued preventative foot care services. Complaint: Patient states" my nails have grown long and thick and become painful to walk and wear shoes on his left foot.  Patient has been diagnosed with DM with angiopathy.  Patient has had transmetatarsal amputation performed toes right foot.. The patient presents for preventative foot care services. No changes to ROS  Podiatric Exam: Vascular: dorsalis pedis and posterior tibial pulses are not  palpable left.  ... Capillary return is immediate. Temperature gradient is WNL. Skin turgor WNL  Significant swelling feet  Left  Sensorium: Normal Semmes Weinstein monofilament test. Normal tactile sensation left foot. Nail Exam: Pt has thick disfigured discolored nails with subungual debris noted  entire nail hallux through fifth toenails left foot. Ulcer Exam: There is no evidence of ulcer or pre-ulcerative changes or infection. Orthopedic Exam: Muscle tone and strength are WNL. No limitations in general ROM. No crepitus or effusions noted. Foot type and digits show no abnormalities. Bony prominences are unremarkable. TMA right foot. Skin: No Porokeratosis. No infection or ulcers  Diagnosis:  Onychomycosis, ,, pain in left toes,  TMA right foot.  Treatment & Plan Procedures and Treatment: Consent by patient was obtained for treatment procedures.   Debridement of mycotic and hypertrophic toenails, 1 through 5 left foot  and clearing of subungual debris. No ulceration, no infection noted.  Return Visit-Office Procedure: Patient instructed to return to the office for a follow up visit 3 months for continued evaluation and treatment.    Gardiner Barefoot DPM

## 2020-03-28 ENCOUNTER — Other Ambulatory Visit: Payer: Self-pay | Admitting: Nephrology

## 2020-03-28 DIAGNOSIS — E1129 Type 2 diabetes mellitus with other diabetic kidney complication: Secondary | ICD-10-CM | POA: Diagnosis not present

## 2020-03-28 DIAGNOSIS — N2581 Secondary hyperparathyroidism of renal origin: Secondary | ICD-10-CM | POA: Diagnosis not present

## 2020-03-28 DIAGNOSIS — Z23 Encounter for immunization: Secondary | ICD-10-CM | POA: Diagnosis not present

## 2020-03-28 DIAGNOSIS — Z992 Dependence on renal dialysis: Secondary | ICD-10-CM | POA: Diagnosis not present

## 2020-03-28 DIAGNOSIS — D509 Iron deficiency anemia, unspecified: Secondary | ICD-10-CM | POA: Diagnosis not present

## 2020-03-28 DIAGNOSIS — N186 End stage renal disease: Secondary | ICD-10-CM | POA: Diagnosis not present

## 2020-03-28 DIAGNOSIS — R109 Unspecified abdominal pain: Secondary | ICD-10-CM

## 2020-03-30 DIAGNOSIS — D509 Iron deficiency anemia, unspecified: Secondary | ICD-10-CM | POA: Diagnosis not present

## 2020-03-30 DIAGNOSIS — N2581 Secondary hyperparathyroidism of renal origin: Secondary | ICD-10-CM | POA: Diagnosis not present

## 2020-03-30 DIAGNOSIS — Z23 Encounter for immunization: Secondary | ICD-10-CM | POA: Diagnosis not present

## 2020-03-30 DIAGNOSIS — E1129 Type 2 diabetes mellitus with other diabetic kidney complication: Secondary | ICD-10-CM | POA: Diagnosis not present

## 2020-03-30 DIAGNOSIS — Z992 Dependence on renal dialysis: Secondary | ICD-10-CM | POA: Diagnosis not present

## 2020-03-30 DIAGNOSIS — N186 End stage renal disease: Secondary | ICD-10-CM | POA: Diagnosis not present

## 2020-04-01 DIAGNOSIS — E1129 Type 2 diabetes mellitus with other diabetic kidney complication: Secondary | ICD-10-CM | POA: Diagnosis not present

## 2020-04-01 DIAGNOSIS — N186 End stage renal disease: Secondary | ICD-10-CM | POA: Diagnosis not present

## 2020-04-01 DIAGNOSIS — Z992 Dependence on renal dialysis: Secondary | ICD-10-CM | POA: Diagnosis not present

## 2020-04-02 DIAGNOSIS — N2581 Secondary hyperparathyroidism of renal origin: Secondary | ICD-10-CM | POA: Diagnosis not present

## 2020-04-02 DIAGNOSIS — E1129 Type 2 diabetes mellitus with other diabetic kidney complication: Secondary | ICD-10-CM | POA: Diagnosis not present

## 2020-04-02 DIAGNOSIS — N186 End stage renal disease: Secondary | ICD-10-CM | POA: Diagnosis not present

## 2020-04-02 DIAGNOSIS — Z992 Dependence on renal dialysis: Secondary | ICD-10-CM | POA: Diagnosis not present

## 2020-04-03 DIAGNOSIS — Z7901 Long term (current) use of anticoagulants: Secondary | ICD-10-CM | POA: Diagnosis not present

## 2020-04-03 DIAGNOSIS — I4891 Unspecified atrial fibrillation: Secondary | ICD-10-CM | POA: Diagnosis not present

## 2020-04-03 DIAGNOSIS — D6869 Other thrombophilia: Secondary | ICD-10-CM | POA: Diagnosis not present

## 2020-04-04 DIAGNOSIS — Z992 Dependence on renal dialysis: Secondary | ICD-10-CM | POA: Diagnosis not present

## 2020-04-04 DIAGNOSIS — N186 End stage renal disease: Secondary | ICD-10-CM | POA: Diagnosis not present

## 2020-04-04 DIAGNOSIS — N2581 Secondary hyperparathyroidism of renal origin: Secondary | ICD-10-CM | POA: Diagnosis not present

## 2020-04-04 DIAGNOSIS — E1129 Type 2 diabetes mellitus with other diabetic kidney complication: Secondary | ICD-10-CM | POA: Diagnosis not present

## 2020-04-06 DIAGNOSIS — Z992 Dependence on renal dialysis: Secondary | ICD-10-CM | POA: Diagnosis not present

## 2020-04-06 DIAGNOSIS — E1129 Type 2 diabetes mellitus with other diabetic kidney complication: Secondary | ICD-10-CM | POA: Diagnosis not present

## 2020-04-06 DIAGNOSIS — N2581 Secondary hyperparathyroidism of renal origin: Secondary | ICD-10-CM | POA: Diagnosis not present

## 2020-04-06 DIAGNOSIS — N186 End stage renal disease: Secondary | ICD-10-CM | POA: Diagnosis not present

## 2020-04-09 DIAGNOSIS — N2581 Secondary hyperparathyroidism of renal origin: Secondary | ICD-10-CM | POA: Diagnosis not present

## 2020-04-09 DIAGNOSIS — N186 End stage renal disease: Secondary | ICD-10-CM | POA: Diagnosis not present

## 2020-04-09 DIAGNOSIS — Z992 Dependence on renal dialysis: Secondary | ICD-10-CM | POA: Diagnosis not present

## 2020-04-09 DIAGNOSIS — E1129 Type 2 diabetes mellitus with other diabetic kidney complication: Secondary | ICD-10-CM | POA: Diagnosis not present

## 2020-04-11 DIAGNOSIS — E1129 Type 2 diabetes mellitus with other diabetic kidney complication: Secondary | ICD-10-CM | POA: Diagnosis not present

## 2020-04-11 DIAGNOSIS — N186 End stage renal disease: Secondary | ICD-10-CM | POA: Diagnosis not present

## 2020-04-11 DIAGNOSIS — Z992 Dependence on renal dialysis: Secondary | ICD-10-CM | POA: Diagnosis not present

## 2020-04-11 DIAGNOSIS — N2581 Secondary hyperparathyroidism of renal origin: Secondary | ICD-10-CM | POA: Diagnosis not present

## 2020-04-13 DIAGNOSIS — N186 End stage renal disease: Secondary | ICD-10-CM | POA: Diagnosis not present

## 2020-04-13 DIAGNOSIS — N2581 Secondary hyperparathyroidism of renal origin: Secondary | ICD-10-CM | POA: Diagnosis not present

## 2020-04-13 DIAGNOSIS — E1129 Type 2 diabetes mellitus with other diabetic kidney complication: Secondary | ICD-10-CM | POA: Diagnosis not present

## 2020-04-13 DIAGNOSIS — Z992 Dependence on renal dialysis: Secondary | ICD-10-CM | POA: Diagnosis not present

## 2020-04-16 DIAGNOSIS — N186 End stage renal disease: Secondary | ICD-10-CM | POA: Diagnosis not present

## 2020-04-16 DIAGNOSIS — E1129 Type 2 diabetes mellitus with other diabetic kidney complication: Secondary | ICD-10-CM | POA: Diagnosis not present

## 2020-04-16 DIAGNOSIS — N2581 Secondary hyperparathyroidism of renal origin: Secondary | ICD-10-CM | POA: Diagnosis not present

## 2020-04-16 DIAGNOSIS — Z992 Dependence on renal dialysis: Secondary | ICD-10-CM | POA: Diagnosis not present

## 2020-04-17 DIAGNOSIS — D6869 Other thrombophilia: Secondary | ICD-10-CM | POA: Diagnosis not present

## 2020-04-17 DIAGNOSIS — Z7901 Long term (current) use of anticoagulants: Secondary | ICD-10-CM | POA: Diagnosis not present

## 2020-04-17 DIAGNOSIS — I4891 Unspecified atrial fibrillation: Secondary | ICD-10-CM | POA: Diagnosis not present

## 2020-04-18 DIAGNOSIS — N2581 Secondary hyperparathyroidism of renal origin: Secondary | ICD-10-CM | POA: Diagnosis not present

## 2020-04-18 DIAGNOSIS — E1129 Type 2 diabetes mellitus with other diabetic kidney complication: Secondary | ICD-10-CM | POA: Diagnosis not present

## 2020-04-18 DIAGNOSIS — N186 End stage renal disease: Secondary | ICD-10-CM | POA: Diagnosis not present

## 2020-04-18 DIAGNOSIS — Z992 Dependence on renal dialysis: Secondary | ICD-10-CM | POA: Diagnosis not present

## 2020-04-19 ENCOUNTER — Other Ambulatory Visit: Payer: Self-pay

## 2020-04-19 ENCOUNTER — Encounter (HOSPITAL_COMMUNITY): Payer: Self-pay

## 2020-04-19 ENCOUNTER — Ambulatory Visit (HOSPITAL_COMMUNITY)
Admission: RE | Admit: 2020-04-19 | Discharge: 2020-04-19 | Disposition: A | Discharge status: Home / Self Care | Payer: Medicare Other | Source: Ambulatory Visit | Attending: Nephrology | Admitting: Nephrology

## 2020-04-19 DIAGNOSIS — K8050 Calculus of bile duct without cholangitis or cholecystitis without obstruction: Secondary | ICD-10-CM | POA: Diagnosis not present

## 2020-04-19 DIAGNOSIS — R109 Unspecified abdominal pain: Secondary | ICD-10-CM | POA: Insufficient documentation

## 2020-04-19 MED ORDER — IOHEXOL 9 MG/ML PO SOLN
ORAL | Status: AC
  Filled 2020-04-19: qty 1000

## 2020-04-20 DIAGNOSIS — N2581 Secondary hyperparathyroidism of renal origin: Secondary | ICD-10-CM | POA: Diagnosis not present

## 2020-04-20 DIAGNOSIS — Z992 Dependence on renal dialysis: Secondary | ICD-10-CM | POA: Diagnosis not present

## 2020-04-20 DIAGNOSIS — E1129 Type 2 diabetes mellitus with other diabetic kidney complication: Secondary | ICD-10-CM | POA: Diagnosis not present

## 2020-04-20 DIAGNOSIS — N186 End stage renal disease: Secondary | ICD-10-CM | POA: Diagnosis not present

## 2020-04-22 DIAGNOSIS — Z992 Dependence on renal dialysis: Secondary | ICD-10-CM | POA: Diagnosis not present

## 2020-04-22 DIAGNOSIS — N186 End stage renal disease: Secondary | ICD-10-CM | POA: Diagnosis not present

## 2020-04-22 DIAGNOSIS — E1129 Type 2 diabetes mellitus with other diabetic kidney complication: Secondary | ICD-10-CM | POA: Diagnosis not present

## 2020-04-22 DIAGNOSIS — N2581 Secondary hyperparathyroidism of renal origin: Secondary | ICD-10-CM | POA: Diagnosis not present

## 2020-04-24 ENCOUNTER — Ambulatory Visit (HOSPITAL_COMMUNITY): Payer: Medicare Other

## 2020-04-24 ENCOUNTER — Encounter (HOSPITAL_COMMUNITY): Payer: Self-pay

## 2020-04-24 DIAGNOSIS — N2581 Secondary hyperparathyroidism of renal origin: Secondary | ICD-10-CM | POA: Diagnosis not present

## 2020-04-24 DIAGNOSIS — N186 End stage renal disease: Secondary | ICD-10-CM | POA: Diagnosis not present

## 2020-04-24 DIAGNOSIS — E1129 Type 2 diabetes mellitus with other diabetic kidney complication: Secondary | ICD-10-CM | POA: Diagnosis not present

## 2020-04-24 DIAGNOSIS — Z992 Dependence on renal dialysis: Secondary | ICD-10-CM | POA: Diagnosis not present

## 2020-04-27 DIAGNOSIS — N2581 Secondary hyperparathyroidism of renal origin: Secondary | ICD-10-CM | POA: Diagnosis not present

## 2020-04-27 DIAGNOSIS — Z992 Dependence on renal dialysis: Secondary | ICD-10-CM | POA: Diagnosis not present

## 2020-04-27 DIAGNOSIS — N186 End stage renal disease: Secondary | ICD-10-CM | POA: Diagnosis not present

## 2020-04-27 DIAGNOSIS — E1129 Type 2 diabetes mellitus with other diabetic kidney complication: Secondary | ICD-10-CM | POA: Diagnosis not present

## 2020-04-29 DIAGNOSIS — Z006 Encounter for examination for normal comparison and control in clinical research program: Secondary | ICD-10-CM | POA: Diagnosis not present

## 2020-04-29 DIAGNOSIS — Z794 Long term (current) use of insulin: Secondary | ICD-10-CM | POA: Diagnosis not present

## 2020-04-29 DIAGNOSIS — Z992 Dependence on renal dialysis: Secondary | ICD-10-CM | POA: Diagnosis not present

## 2020-04-29 DIAGNOSIS — N186 End stage renal disease: Secondary | ICD-10-CM | POA: Diagnosis not present

## 2020-04-29 DIAGNOSIS — E119 Type 2 diabetes mellitus without complications: Secondary | ICD-10-CM | POA: Diagnosis not present

## 2020-04-30 DIAGNOSIS — E1129 Type 2 diabetes mellitus with other diabetic kidney complication: Secondary | ICD-10-CM | POA: Diagnosis not present

## 2020-04-30 DIAGNOSIS — Z992 Dependence on renal dialysis: Secondary | ICD-10-CM | POA: Diagnosis not present

## 2020-04-30 DIAGNOSIS — N186 End stage renal disease: Secondary | ICD-10-CM | POA: Diagnosis not present

## 2020-04-30 DIAGNOSIS — N2581 Secondary hyperparathyroidism of renal origin: Secondary | ICD-10-CM | POA: Diagnosis not present

## 2020-05-01 DIAGNOSIS — N186 End stage renal disease: Secondary | ICD-10-CM | POA: Diagnosis not present

## 2020-05-01 DIAGNOSIS — E1129 Type 2 diabetes mellitus with other diabetic kidney complication: Secondary | ICD-10-CM | POA: Diagnosis not present

## 2020-05-01 DIAGNOSIS — Z992 Dependence on renal dialysis: Secondary | ICD-10-CM | POA: Diagnosis not present

## 2020-05-02 DIAGNOSIS — E1129 Type 2 diabetes mellitus with other diabetic kidney complication: Secondary | ICD-10-CM | POA: Diagnosis not present

## 2020-05-02 DIAGNOSIS — N2581 Secondary hyperparathyroidism of renal origin: Secondary | ICD-10-CM | POA: Diagnosis not present

## 2020-05-02 DIAGNOSIS — N186 End stage renal disease: Secondary | ICD-10-CM | POA: Diagnosis not present

## 2020-05-02 DIAGNOSIS — Z992 Dependence on renal dialysis: Secondary | ICD-10-CM | POA: Diagnosis not present

## 2020-05-02 DIAGNOSIS — D631 Anemia in chronic kidney disease: Secondary | ICD-10-CM | POA: Diagnosis not present

## 2020-05-04 DIAGNOSIS — D631 Anemia in chronic kidney disease: Secondary | ICD-10-CM | POA: Diagnosis not present

## 2020-05-04 DIAGNOSIS — E1129 Type 2 diabetes mellitus with other diabetic kidney complication: Secondary | ICD-10-CM | POA: Diagnosis not present

## 2020-05-04 DIAGNOSIS — N186 End stage renal disease: Secondary | ICD-10-CM | POA: Diagnosis not present

## 2020-05-04 DIAGNOSIS — N2581 Secondary hyperparathyroidism of renal origin: Secondary | ICD-10-CM | POA: Diagnosis not present

## 2020-05-04 DIAGNOSIS — Z992 Dependence on renal dialysis: Secondary | ICD-10-CM | POA: Diagnosis not present

## 2020-05-07 DIAGNOSIS — N186 End stage renal disease: Secondary | ICD-10-CM | POA: Diagnosis not present

## 2020-05-07 DIAGNOSIS — D631 Anemia in chronic kidney disease: Secondary | ICD-10-CM | POA: Diagnosis not present

## 2020-05-07 DIAGNOSIS — E1129 Type 2 diabetes mellitus with other diabetic kidney complication: Secondary | ICD-10-CM | POA: Diagnosis not present

## 2020-05-07 DIAGNOSIS — N2581 Secondary hyperparathyroidism of renal origin: Secondary | ICD-10-CM | POA: Diagnosis not present

## 2020-05-07 DIAGNOSIS — Z992 Dependence on renal dialysis: Secondary | ICD-10-CM | POA: Diagnosis not present

## 2020-05-09 DIAGNOSIS — D631 Anemia in chronic kidney disease: Secondary | ICD-10-CM | POA: Diagnosis not present

## 2020-05-09 DIAGNOSIS — Z992 Dependence on renal dialysis: Secondary | ICD-10-CM | POA: Diagnosis not present

## 2020-05-09 DIAGNOSIS — N2581 Secondary hyperparathyroidism of renal origin: Secondary | ICD-10-CM | POA: Diagnosis not present

## 2020-05-09 DIAGNOSIS — E1129 Type 2 diabetes mellitus with other diabetic kidney complication: Secondary | ICD-10-CM | POA: Diagnosis not present

## 2020-05-09 DIAGNOSIS — N186 End stage renal disease: Secondary | ICD-10-CM | POA: Diagnosis not present

## 2020-05-11 DIAGNOSIS — D631 Anemia in chronic kidney disease: Secondary | ICD-10-CM | POA: Diagnosis not present

## 2020-05-11 DIAGNOSIS — N186 End stage renal disease: Secondary | ICD-10-CM | POA: Diagnosis not present

## 2020-05-11 DIAGNOSIS — Z992 Dependence on renal dialysis: Secondary | ICD-10-CM | POA: Diagnosis not present

## 2020-05-11 DIAGNOSIS — N2581 Secondary hyperparathyroidism of renal origin: Secondary | ICD-10-CM | POA: Diagnosis not present

## 2020-05-11 DIAGNOSIS — E1129 Type 2 diabetes mellitus with other diabetic kidney complication: Secondary | ICD-10-CM | POA: Diagnosis not present

## 2020-05-13 ENCOUNTER — Inpatient Hospital Stay (HOSPITAL_COMMUNITY)
Admission: EM | Admit: 2020-05-13 | Discharge: 2020-05-24 | DRG: 417 | Disposition: A | Discharge status: Home Health | Payer: Medicare Other | Attending: Internal Medicine | Admitting: Internal Medicine

## 2020-05-13 ENCOUNTER — Emergency Department (HOSPITAL_COMMUNITY): Payer: Medicare Other

## 2020-05-13 ENCOUNTER — Other Ambulatory Visit: Payer: Self-pay | Admitting: Surgery

## 2020-05-13 ENCOUNTER — Encounter (HOSPITAL_COMMUNITY): Payer: Self-pay | Admitting: Internal Medicine

## 2020-05-13 ENCOUNTER — Other Ambulatory Visit: Payer: Self-pay

## 2020-05-13 ENCOUNTER — Observation Stay (HOSPITAL_COMMUNITY): Payer: Medicare Other

## 2020-05-13 DIAGNOSIS — I150 Renovascular hypertension: Secondary | ICD-10-CM | POA: Diagnosis not present

## 2020-05-13 DIAGNOSIS — K8020 Calculus of gallbladder without cholecystitis without obstruction: Secondary | ICD-10-CM | POA: Diagnosis not present

## 2020-05-13 DIAGNOSIS — K429 Umbilical hernia without obstruction or gangrene: Secondary | ICD-10-CM | POA: Diagnosis not present

## 2020-05-13 DIAGNOSIS — I132 Hypertensive heart and chronic kidney disease with heart failure and with stage 5 chronic kidney disease, or end stage renal disease: Secondary | ICD-10-CM | POA: Diagnosis not present

## 2020-05-13 DIAGNOSIS — D62 Acute posthemorrhagic anemia: Secondary | ICD-10-CM | POA: Diagnosis not present

## 2020-05-13 DIAGNOSIS — I4892 Unspecified atrial flutter: Secondary | ICD-10-CM | POA: Diagnosis present

## 2020-05-13 DIAGNOSIS — K828 Other specified diseases of gallbladder: Secondary | ICD-10-CM | POA: Diagnosis not present

## 2020-05-13 DIAGNOSIS — I5022 Chronic systolic (congestive) heart failure: Secondary | ICD-10-CM | POA: Diagnosis not present

## 2020-05-13 DIAGNOSIS — Z86010 Personal history of colonic polyps: Secondary | ICD-10-CM

## 2020-05-13 DIAGNOSIS — E785 Hyperlipidemia, unspecified: Secondary | ICD-10-CM | POA: Diagnosis not present

## 2020-05-13 DIAGNOSIS — E11319 Type 2 diabetes mellitus with unspecified diabetic retinopathy without macular edema: Secondary | ICD-10-CM | POA: Diagnosis present

## 2020-05-13 DIAGNOSIS — R1011 Right upper quadrant pain: Secondary | ICD-10-CM | POA: Diagnosis not present

## 2020-05-13 DIAGNOSIS — J9811 Atelectasis: Secondary | ICD-10-CM | POA: Diagnosis present

## 2020-05-13 DIAGNOSIS — K219 Gastro-esophageal reflux disease without esophagitis: Secondary | ICD-10-CM | POA: Diagnosis present

## 2020-05-13 DIAGNOSIS — K567 Ileus, unspecified: Secondary | ICD-10-CM | POA: Diagnosis not present

## 2020-05-13 DIAGNOSIS — Z95828 Presence of other vascular implants and grafts: Secondary | ICD-10-CM

## 2020-05-13 DIAGNOSIS — K8050 Calculus of bile duct without cholangitis or cholecystitis without obstruction: Secondary | ICD-10-CM | POA: Diagnosis not present

## 2020-05-13 DIAGNOSIS — D631 Anemia in chronic kidney disease: Secondary | ICD-10-CM | POA: Diagnosis not present

## 2020-05-13 DIAGNOSIS — Z01810 Encounter for preprocedural cardiovascular examination: Secondary | ICD-10-CM | POA: Diagnosis not present

## 2020-05-13 DIAGNOSIS — I9589 Other hypotension: Secondary | ICD-10-CM | POA: Diagnosis not present

## 2020-05-13 DIAGNOSIS — N250 Renal osteodystrophy: Secondary | ICD-10-CM | POA: Diagnosis present

## 2020-05-13 DIAGNOSIS — Z8249 Family history of ischemic heart disease and other diseases of the circulatory system: Secondary | ICD-10-CM

## 2020-05-13 DIAGNOSIS — I120 Hypertensive chronic kidney disease with stage 5 chronic kidney disease or end stage renal disease: Secondary | ICD-10-CM | POA: Diagnosis not present

## 2020-05-13 DIAGNOSIS — Z794 Long term (current) use of insulin: Secondary | ICD-10-CM

## 2020-05-13 DIAGNOSIS — K838 Other specified diseases of biliary tract: Secondary | ICD-10-CM | POA: Diagnosis not present

## 2020-05-13 DIAGNOSIS — I959 Hypotension, unspecified: Secondary | ICD-10-CM | POA: Diagnosis present

## 2020-05-13 DIAGNOSIS — I428 Other cardiomyopathies: Secondary | ICD-10-CM | POA: Diagnosis present

## 2020-05-13 DIAGNOSIS — I4891 Unspecified atrial fibrillation: Secondary | ICD-10-CM | POA: Diagnosis not present

## 2020-05-13 DIAGNOSIS — C8590 Non-Hodgkin lymphoma, unspecified, unspecified site: Secondary | ICD-10-CM | POA: Diagnosis present

## 2020-05-13 DIAGNOSIS — M879 Osteonecrosis, unspecified: Secondary | ICD-10-CM | POA: Diagnosis not present

## 2020-05-13 DIAGNOSIS — R7989 Other specified abnormal findings of blood chemistry: Secondary | ICD-10-CM | POA: Diagnosis present

## 2020-05-13 DIAGNOSIS — D500 Iron deficiency anemia secondary to blood loss (chronic): Secondary | ICD-10-CM

## 2020-05-13 DIAGNOSIS — N2581 Secondary hyperparathyroidism of renal origin: Secondary | ICD-10-CM | POA: Diagnosis present

## 2020-05-13 DIAGNOSIS — K8060 Calculus of gallbladder and bile duct with cholecystitis, unspecified, without obstruction: Secondary | ICD-10-CM | POA: Diagnosis not present

## 2020-05-13 DIAGNOSIS — G4733 Obstructive sleep apnea (adult) (pediatric): Secondary | ICD-10-CM | POA: Diagnosis present

## 2020-05-13 DIAGNOSIS — N186 End stage renal disease: Secondary | ICD-10-CM | POA: Diagnosis present

## 2020-05-13 DIAGNOSIS — K9189 Other postprocedural complications and disorders of digestive system: Secondary | ICD-10-CM | POA: Diagnosis not present

## 2020-05-13 DIAGNOSIS — Z7901 Long term (current) use of anticoagulants: Secondary | ICD-10-CM | POA: Diagnosis not present

## 2020-05-13 DIAGNOSIS — Z9221 Personal history of antineoplastic chemotherapy: Secondary | ICD-10-CM | POA: Diagnosis not present

## 2020-05-13 DIAGNOSIS — K8080 Other cholelithiasis without obstruction: Secondary | ICD-10-CM

## 2020-05-13 DIAGNOSIS — E1151 Type 2 diabetes mellitus with diabetic peripheral angiopathy without gangrene: Secondary | ICD-10-CM | POA: Diagnosis present

## 2020-05-13 DIAGNOSIS — Z79899 Other long term (current) drug therapy: Secondary | ICD-10-CM

## 2020-05-13 DIAGNOSIS — E6609 Other obesity due to excess calories: Secondary | ICD-10-CM | POA: Diagnosis not present

## 2020-05-13 DIAGNOSIS — K921 Melena: Secondary | ICD-10-CM | POA: Diagnosis not present

## 2020-05-13 DIAGNOSIS — K2970 Gastritis, unspecified, without bleeding: Secondary | ICD-10-CM | POA: Diagnosis not present

## 2020-05-13 DIAGNOSIS — Z6833 Body mass index (BMI) 33.0-33.9, adult: Secondary | ICD-10-CM | POA: Diagnosis not present

## 2020-05-13 DIAGNOSIS — K8070 Calculus of gallbladder and bile duct without cholecystitis without obstruction: Secondary | ICD-10-CM | POA: Diagnosis not present

## 2020-05-13 DIAGNOSIS — K5730 Diverticulosis of large intestine without perforation or abscess without bleeding: Secondary | ICD-10-CM | POA: Diagnosis present

## 2020-05-13 DIAGNOSIS — I5020 Unspecified systolic (congestive) heart failure: Secondary | ICD-10-CM | POA: Diagnosis present

## 2020-05-13 DIAGNOSIS — Z992 Dependence on renal dialysis: Secondary | ICD-10-CM

## 2020-05-13 DIAGNOSIS — I151 Hypertension secondary to other renal disorders: Secondary | ICD-10-CM | POA: Diagnosis not present

## 2020-05-13 DIAGNOSIS — I314 Cardiac tamponade: Secondary | ICD-10-CM | POA: Diagnosis not present

## 2020-05-13 DIAGNOSIS — K8010 Calculus of gallbladder with chronic cholecystitis without obstruction: Secondary | ICD-10-CM | POA: Diagnosis not present

## 2020-05-13 DIAGNOSIS — N132 Hydronephrosis with renal and ureteral calculous obstruction: Secondary | ICD-10-CM | POA: Diagnosis not present

## 2020-05-13 DIAGNOSIS — D179 Benign lipomatous neoplasm, unspecified: Secondary | ICD-10-CM | POA: Diagnosis not present

## 2020-05-13 DIAGNOSIS — Z20822 Contact with and (suspected) exposure to covid-19: Secondary | ICD-10-CM | POA: Diagnosis present

## 2020-05-13 DIAGNOSIS — J9601 Acute respiratory failure with hypoxia: Secondary | ICD-10-CM | POA: Diagnosis not present

## 2020-05-13 DIAGNOSIS — K851 Biliary acute pancreatitis without necrosis or infection: Secondary | ICD-10-CM | POA: Diagnosis not present

## 2020-05-13 DIAGNOSIS — E1122 Type 2 diabetes mellitus with diabetic chronic kidney disease: Secondary | ICD-10-CM | POA: Diagnosis present

## 2020-05-13 DIAGNOSIS — Z833 Family history of diabetes mellitus: Secondary | ICD-10-CM

## 2020-05-13 DIAGNOSIS — K3189 Other diseases of stomach and duodenum: Secondary | ICD-10-CM | POA: Diagnosis not present

## 2020-05-13 DIAGNOSIS — K254 Chronic or unspecified gastric ulcer with hemorrhage: Secondary | ICD-10-CM | POA: Diagnosis not present

## 2020-05-13 DIAGNOSIS — I480 Paroxysmal atrial fibrillation: Secondary | ICD-10-CM | POA: Diagnosis present

## 2020-05-13 DIAGNOSIS — I953 Hypotension of hemodialysis: Secondary | ICD-10-CM | POA: Diagnosis not present

## 2020-05-13 DIAGNOSIS — K635 Polyp of colon: Secondary | ICD-10-CM | POA: Diagnosis present

## 2020-05-13 DIAGNOSIS — K922 Gastrointestinal hemorrhage, unspecified: Secondary | ICD-10-CM | POA: Diagnosis not present

## 2020-05-13 DIAGNOSIS — N2889 Other specified disorders of kidney and ureter: Secondary | ICD-10-CM | POA: Diagnosis not present

## 2020-05-13 DIAGNOSIS — K259 Gastric ulcer, unspecified as acute or chronic, without hemorrhage or perforation: Secondary | ICD-10-CM | POA: Diagnosis not present

## 2020-05-13 DIAGNOSIS — E440 Moderate protein-calorie malnutrition: Secondary | ICD-10-CM | POA: Diagnosis not present

## 2020-05-13 DIAGNOSIS — E66811 Obesity, class 1: Secondary | ICD-10-CM

## 2020-05-13 DIAGNOSIS — E1129 Type 2 diabetes mellitus with other diabetic kidney complication: Secondary | ICD-10-CM | POA: Diagnosis not present

## 2020-05-13 DIAGNOSIS — Z86718 Personal history of other venous thrombosis and embolism: Secondary | ICD-10-CM

## 2020-05-13 DIAGNOSIS — D696 Thrombocytopenia, unspecified: Secondary | ICD-10-CM | POA: Diagnosis present

## 2020-05-13 DIAGNOSIS — E8889 Other specified metabolic disorders: Secondary | ICD-10-CM | POA: Diagnosis present

## 2020-05-13 DIAGNOSIS — Z419 Encounter for procedure for purposes other than remedying health state, unspecified: Secondary | ICD-10-CM

## 2020-05-13 HISTORY — DX: Sepsis, unspecified organism: A41.9

## 2020-05-13 HISTORY — DX: Unspecified atrial fibrillation: I48.91

## 2020-05-13 LAB — PROTIME-INR
INR: 2.4 — ABNORMAL HIGH (ref 0.8–1.2)
Prothrombin Time: 25.2 seconds — ABNORMAL HIGH (ref 11.4–15.2)

## 2020-05-13 LAB — COMPREHENSIVE METABOLIC PANEL
ALT: 27 U/L (ref 0–44)
AST: 24 U/L (ref 15–41)
Albumin: 3.4 g/dL — ABNORMAL LOW (ref 3.5–5.0)
Alkaline Phosphatase: 121 U/L (ref 38–126)
Anion gap: 18 — ABNORMAL HIGH (ref 5–15)
BUN: 52 mg/dL — ABNORMAL HIGH (ref 8–23)
CO2: 28 mmol/L (ref 22–32)
Calcium: 8.4 mg/dL — ABNORMAL LOW (ref 8.9–10.3)
Chloride: 96 mmol/L — ABNORMAL LOW (ref 98–111)
Creatinine, Ser: 9.65 mg/dL — ABNORMAL HIGH (ref 0.61–1.24)
GFR, Estimated: 5 mL/min — ABNORMAL LOW (ref >60)
Glucose, Bld: 130 mg/dL — ABNORMAL HIGH (ref 70–99)
Potassium: 4.3 mmol/L (ref 3.5–5.1)
Sodium: 142 mmol/L (ref 135–145)
Total Bilirubin: 0.8 mg/dL (ref 0.3–1.2)
Total Protein: 7.2 g/dL (ref 6.5–8.1)

## 2020-05-13 LAB — CBC
HCT: 37.1 % — ABNORMAL LOW (ref 39.0–52.0)
Hemoglobin: 11.6 g/dL — ABNORMAL LOW (ref 13.0–17.0)
MCH: 30.3 pg (ref 26.0–34.0)
MCHC: 31.3 g/dL (ref 30.0–36.0)
MCV: 96.9 fL (ref 80.0–100.0)
Platelets: 154 10*3/uL (ref 150–400)
RBC: 3.83 MIL/uL — ABNORMAL LOW (ref 4.22–5.81)
RDW: 16.2 % — ABNORMAL HIGH (ref 11.5–15.5)
WBC: 5.8 10*3/uL (ref 4.0–10.5)
nRBC: 0 % (ref 0.0–0.2)

## 2020-05-13 LAB — RESP PANEL BY RT-PCR (FLU A&B, COVID) ARPGX2
Influenza A by PCR: NEGATIVE
Influenza B by PCR: NEGATIVE
SARS Coronavirus 2 by RT PCR: NEGATIVE

## 2020-05-13 LAB — HIV ANTIBODY (ROUTINE TESTING W REFLEX): HIV Screen 4th Generation wRfx: NONREACTIVE

## 2020-05-13 LAB — CBG MONITORING, ED: Glucose-Capillary: 80 mg/dL (ref 70–99)

## 2020-05-13 LAB — LIPASE, BLOOD: Lipase: 43 U/L (ref 11–51)

## 2020-05-13 LAB — HEMOGLOBIN A1C
Hgb A1c MFr Bld: 7.2 % — ABNORMAL HIGH (ref 4.8–5.6)
Mean Plasma Glucose: 159.94 mg/dL

## 2020-05-13 MED ORDER — ONDANSETRON HCL 4 MG PO TABS: 4.0000 mg | ORAL_TABLET | Freq: Four times a day (QID) | ORAL | Status: DC | PRN

## 2020-05-13 MED ORDER — ONDANSETRON HCL 4 MG/2ML IJ SOLN
4.0000 mg | Freq: Four times a day (QID) | INTRAMUSCULAR | Status: DC | PRN
  Administered 2020-05-17: 4 mg via INTRAVENOUS

## 2020-05-13 MED ORDER — ACETAMINOPHEN 325 MG PO TABS
650.0000 mg | ORAL_TABLET | Freq: Four times a day (QID) | ORAL | Status: DC | PRN
  Administered 2020-05-15 – 2020-05-16 (×2): 650 mg via ORAL
  Filled 2020-05-13 (×2): qty 2

## 2020-05-13 MED ORDER — SORBITOL 70 % SOLN
30.0000 mL | Status: DC | PRN
  Filled 2020-05-13: qty 30

## 2020-05-13 MED ORDER — HYDRALAZINE HCL 20 MG/ML IJ SOLN: 10.0000 mg | INTRAMUSCULAR | Status: DC | PRN

## 2020-05-13 MED ORDER — INSULIN GLARGINE 100 UNIT/ML ~~LOC~~ SOLN
10.0000 [IU] | Freq: Every day | SUBCUTANEOUS | Status: DC
  Administered 2020-05-13: 10 [IU] via SUBCUTANEOUS
  Filled 2020-05-13 (×2): qty 0.1

## 2020-05-13 MED ORDER — HYDROXYZINE HCL 25 MG PO TABS: 25.0000 mg | ORAL_TABLET | Freq: Three times a day (TID) | ORAL | Status: DC | PRN

## 2020-05-13 MED ORDER — RENA-VITE PO TABS
1.0000 | ORAL_TABLET | Freq: Every day | ORAL | Status: DC
  Administered 2020-05-14 – 2020-05-23 (×11): 1 via ORAL
  Filled 2020-05-13 (×14): qty 1

## 2020-05-13 MED ORDER — CALCIUM CARBONATE ANTACID 1250 MG/5ML PO SUSP
500.0000 mg | Freq: Four times a day (QID) | ORAL | Status: DC | PRN
  Filled 2020-05-13: qty 5

## 2020-05-13 MED ORDER — SEVELAMER CARBONATE 800 MG PO TABS
800.0000 mg | ORAL_TABLET | Freq: Three times a day (TID) | ORAL | Status: DC
  Administered 2020-05-14 – 2020-05-24 (×18): 800 mg via ORAL
  Filled 2020-05-13 (×22): qty 1

## 2020-05-13 MED ORDER — POLYSACCHARIDE IRON COMPLEX 150 MG PO CAPS
150.0000 mg | ORAL_CAPSULE | Freq: Every day | ORAL | Status: DC
  Filled 2020-05-13: qty 1

## 2020-05-13 MED ORDER — ACETAMINOPHEN 650 MG RE SUPP: 650.0000 mg | Freq: Four times a day (QID) | RECTAL | Status: DC | PRN

## 2020-05-13 MED ORDER — NEPRO/CARBSTEADY PO LIQD
237.0000 mL | Freq: Three times a day (TID) | ORAL | Status: DC | PRN
  Filled 2020-05-13: qty 237

## 2020-05-13 MED ORDER — DOCUSATE SODIUM 283 MG RE ENEM
1.0000 | ENEMA | RECTAL | Status: DC | PRN
  Filled 2020-05-13: qty 1

## 2020-05-13 MED ORDER — FENTANYL CITRATE (PF) 100 MCG/2ML IJ SOLN
25.0000 ug | INTRAMUSCULAR | Status: DC | PRN
  Administered 2020-05-14 – 2020-05-19 (×5): 25 ug via INTRAVENOUS
  Filled 2020-05-13 (×5): qty 2

## 2020-05-13 MED ORDER — INSULIN ASPART 100 UNIT/ML ~~LOC~~ SOLN
0.0000 [IU] | Freq: Three times a day (TID) | SUBCUTANEOUS | Status: DC
  Administered 2020-05-19 – 2020-05-23 (×4): 1 [IU] via SUBCUTANEOUS

## 2020-05-13 MED ORDER — CAMPHOR-MENTHOL 0.5-0.5 % EX LOTN
1.0000 "application " | TOPICAL_LOTION | Freq: Three times a day (TID) | CUTANEOUS | Status: DC | PRN
  Filled 2020-05-13: qty 222

## 2020-05-13 MED ORDER — MIDODRINE HCL 5 MG PO TABS: 5.0000 mg | ORAL_TABLET | Freq: Three times a day (TID) | ORAL | Status: DC

## 2020-05-13 MED ORDER — ZOLPIDEM TARTRATE 5 MG PO TABS
5.0000 mg | ORAL_TABLET | Freq: Every evening | ORAL | Status: DC | PRN
  Filled 2020-05-13 (×2): qty 1

## 2020-05-13 NOTE — H&P (Signed)
History and Physical    [name]:[id number] DOB: [birth year] DOA: 05/13/2020  PCP: Burnard Bunting, MD Consultants:  [name] - GI; [facility] - nephrology; Aldona Bar - transplant Patient coming from:  Home - lives with brother; NOK: Rusty Aus, [phone]  Chief Complaint: abdominal pain, sent by surgery  HPI: [name] is a 68 y.o. male with medical history significant of OSA not on CPAP; PVD s/p R TMA; NHL; DM; HTN; HLD; chronic systolic CHF; afib/flutter; h/o pericardial tamponade s/p window; and ESRD on TTS HD presenting with choledocholithiasis, scheduled for GB evaluation on 1/10.  He has had RUQ pain for 2-3 weeks.  He had an appointment at [facility] for January appointment in [place].  He was called Friday with report of cancellation, had appointment today.  The doctor came in and said he needed to come to the hospital.  His pain was better earlier but is mildly painful now.  No fevers.  No n/v.  He is having normal BMs.  He still has a gallbladder.  He has TTS HD and had a regular session on Saturday.  His pain is not worse after eating, just comes and goes.    ED Course:  CT on 11/21 with gallstones, CBD 14 -> 10 mm.  Referred to Gen Surg, went today and sent to ER for admission.  Needs labs, [place] and go from there.  ?MRCP, renal stone CT.  GI suggests MRCP.  Must have HD after MRCP.  Does not appear to be urgent.  Review of Systems: As per HPI; otherwise review of systems reviewed and negative.   Ambulatory Status:  Ambulates with a walker  COVID Vaccine Status:   Complete plus booster  Past Medical History:  Diagnosis Date  . Anemia   . Arthritis    HNP- lumbar, "all over my body"  . Blood transfusion    "years ago; blood was low" (08/05/2013)  . Diabetic nephropathy ([place])   . Diabetic retinopathy   . DVT (deep venous thrombosis) ([place])    "got one in my right leg now; I've had one before too, not sure which leg" (08/05/2013)  . ESRD (end stage renal disease) on  dialysis ([place])    TTS, [place]  . Family history of anesthesia complication    " my son wakes up slowly"  . GERD (gastroesophageal reflux disease)    uses alka seltzere on occas.   [name])    "one q now and then" (08/05/2013)  . Hyperlipidemia   . Hypertension   . 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 Non Hodgkin's lymphoma ([place])    Tx 2009; "had chemo; it went away" (08/05/2013)  . Noncompliance 03/16/2012  . NSVT (nonsustained ventricular tachycardia) ([place]) 03/18/2012  . Peripheral vascular disease ([place])   . Poor historian    pt. unsure of several answers to health history questions   . Skin cancer    melanoma - head  . Sleep apnea    "suppose to have a sleep study, but they never told me when. (08/05/2013)    Past Surgical History:  Procedure Laterality Date  . ACHILLES TENDON SURGERY Right 03/13/2016   Procedure: ACHILLES LENGTHENING/KIDNER;  Surgeon: Edrick Kins, DPM;  Location: [facility];  Service: Podiatry;  Laterality: Right;  . AV FISTULA PLACEMENT Left 02/03/2013   Procedure: ARTERIOVENOUS (AV) FISTULA CREATION- LEFT RADIAL CEPHALIC; ULTRASOUND GUIDED;  Surgeon: Nelda Severe  Kellie Simmering, MD;  Location: [facility];  Service: Vascular;  Laterality: Left;  . AV FISTULA PLACEMENT Right 11/08/2015   Procedure: RIGHT BRACHIOCEPHALIC ARTERIOVENOUS (AV) FISTULA CREATION;  Surgeon: Serafina Mitchell, MD;  Location: [facility];  Service: Vascular;  Laterality: Right;  . BASCILIC VEIN TRANSPOSITION Right 01/24/2016   Procedure: RIGHT SECOND STAGE BASILIC VEIN TRANSPOSITION;  Surgeon: Angelia Mould, MD;  Location: [facility];  Service: Vascular;  Laterality: Right;  . CARDIAC CATHETERIZATION    . COLONOSCOPY N/A 09/23/2015   Procedure: COLONOSCOPY;  Surgeon: Irene Shipper, MD;  Location: [facility];  Service: Endoscopy;  Laterality: N/A;  . Coloscopy    . EYE SURGERY Bilateral   . GAS INSERTION  05/10/2012   Procedure:  INSERTION OF GAS;  Surgeon: Hayden Pedro, MD;  Location: [place];  Service: Ophthalmology;  Laterality: Right;  . LEFT HEART CATH AND CORONARY ANGIOGRAPHY N/A 09/11/2016   Procedure: Left Heart Cath and Coronary Angiography;  Surgeon: Belva Crome, MD;  Location: [place];  Service: Cardiovascular;  Laterality: N/A;  . LESION EXCISION Right 05/08/2015   Procedure: EXCISION SCALP LESION;  Surgeon: Erroll Luna, MD;  Location: [place];  Service: General;  Laterality: Right;  . MEMBRANE PEEL  05/10/2012   Procedure: MEMBRANE PEEL;  Surgeon: Hayden Pedro, MD;  Location: [place];  Service: Ophthalmology;  Laterality: Right;  . PARS PLANA VITRECTOMY  08/27/2011   Procedure: PARS PLANA VITRECTOMY WITH 25 GAUGE;  Surgeon: Hayden Pedro, MD;  Location: [facility];  Service: Ophthalmology;  Laterality: Left;  Repair of complex traction retinal detachment left eye  . PARS PLANA VITRECTOMY  05/10/2012   Procedure: PARS PLANA VITRECTOMY WITH 25 GAUGE;  Surgeon: Hayden Pedro, MD;  Location: [facility];  Service: Ophthalmology;  Laterality: Right;  Repair Complex Traction Retinal Detachment  . PHOTOCOAGULATION WITH LASER  05/10/2012   Procedure: PHOTOCOAGULATION WITH LASER;  Surgeon: Hayden Pedro, MD;  Location: [facility];  Service: Ophthalmology;  Laterality: Right;  . PORT-A-CATH REMOVAL    . PORTACATH PLACEMENT    . SUBXYPHOID PERICARDIAL WINDOW N/A 01/10/2018   Procedure: SUBXYPHOID PERICARDIAL WINDOW;  Surgeon: Rexene Alberts, MD;  Location: [place];  Service: Thoracic;  Laterality: N/A;  . TRANSMETATARSAL AMPUTATION Right 03/13/2016   Procedure: TRANSMETATARSAL AMPUTATION;  Surgeon: Edrick Kins, DPM;  Location: [facility];  Service: Podiatry;  Laterality: Right;  . VENA CAVA FILTER PLACEMENT  09/2012   due to preparation for surgery    Social History   Socioeconomic History  . Marital status: Single    Spouse name: Not on file  . Number of children: 1  . Years of education: Not on file  . Highest  education level: Not on file  Occupational History  . Occupation: Retired     Comment: truck Geophysicist/field seismologist  Tobacco Use  . Smoking status: Never Smoker  . Smokeless tobacco: Never Used  Substance and Sexual Activity  . Alcohol use: No    Alcohol/week: 0.0 standard drinks  . Drug use: No  . Sexual activity: Not Currently  Other Topics Concern  . Not on file  Social History Narrative  . Not on file   Social Determinants of Health   Financial Resource Strain: Not on file  Food Insecurity: Not on file  Transportation Needs: Not on file  Physical Activity: Not on file  Stress: Not on file  Social Connections: Not on file  Intimate Partner Violence: Not on file  No Known Allergies  Family History  Problem Relation Age of Onset  . Anesthesia problems Son   . Hypertension Son   . Diabetes Father   . Hypertension Father   . Other Father        amputation  . Colon cancer Neg Hx     Prior to Admission medications   Medication Sig Start Date End Date Taking? Authorizing Provider  acetaminophen (TYLENOL) 325 MG tablet Take 650 mg by mouth every 6 (six) hours as needed (pain).    [provider]  B Complex-C-Folic Acid (RENA-VITE PO) Take by mouth. 08/08/13   [provider]  Cholecalciferol (VITAMIN D3) 50000 units CAPS Take 1 capsule by mouth daily.    [provider]  insulin aspart (NOVOLOG) 100 UNIT/ML injection Inject 1-9 Units into the skin 3 (three) times daily before meals. 01/21/18   Domenic Polite, MD  insulin glargine (LANTUS) 100 UNIT/ML injection Inject 0.15 mLs (15 Units total) into the skin at bedtime. 03/17/16   Theodis Blaze, MD  lidocaine-prilocaine (EMLA) cream Apply 1 application topically as needed (Apply small amount to access site 1-2 hours before dialysis. Cover with occlusive dressing (saran wrap)).     [provider]  midodrine (PROAMATINE) 5 MG tablet Take 5 mg by mouth 3 (three) times daily with meals. Take ONLY on  DIALYSIS DAYS    [provider]  MULTIPLE VITAMINS-MINERALS PO Take by mouth. 1 tablet PO daily    [provider]  oseltamivir (TAMIFLU) 30 MG capsule TAKE 1 CAPSULE BY MOUTH TODAY THEN AFTER DIALYSIS ON SAT TUES THUR SAT TUES 07/29/18   [provider]  polysaccharide iron (NIFEREX) 150 MG CAPS capsule Take 1 capsule (150 mg total) by mouth daily. 01/16/11   Isaac Bliss, Rayford Halsted, MD  sevelamer carbonate (RENVELA) 800 MG tablet Take 1,600 mg by mouth 3 (three) times daily with meals.    [provider]  Skin Protectants, Misc. (MINERIN) CREA Apply topically. Topically every 12 hours    [provider]  traMADol (ULTRAM) 50 MG tablet Take 1 tablet (50 mg total) by mouth every 6 (six) hours as needed. 07/17/18   Orpah Greek, MD  vitamin C (ASCORBIC ACID) 500 MG tablet Take 500 mg by mouth 2 (two) times daily.    [provider]  warfarin (COUMADIN) 5 MG tablet TAKE 1 TABLET BY MOUTH ON TUES WEDS & FRI. TAKE 1 & 1 2 TABLETS MON THURS SAT & SUN 04/13/18   [provider]  zinc sulfate 220 (50 Zn) MG capsule Take 220 mg by mouth daily.    [provider]    Physical Exam: Vitals:   05/13/20 1143 05/13/20 1345 05/13/20 1445 05/13/20 1640  BP: 122/67 (!) 179/91 (!) 174/102 (!) 181/100  Pulse: 61 78 98 95  Resp: 20 19 18 18   Temp:  97.8 F (36.6 C) (!) 97.5 F (36.4 C) (!) 97.4 F (36.3 C)  TempSrc:   Oral Oral  SpO2: 99% 95% 100% 96%  Weight:      Height:         . General:  Appears calm and comfortable and is NAD . Eyes:  PERRL, mildly strabismus, normal lids, iris . ENT:  grossly normal hearing, lips & tongue, mmm; mostly absent dentition . Neck:  no LAD, masses or thyromegaly . Cardiovascular:  RRR, no m/r/g. No LE edema.  Marland Kitchen Respiratory:   CTA bilaterally with no wheezes/rales/rhonchi.  Normal respiratory effort. Marland Kitchen  Abdomen:  Soft, mild RUQ TTP, ND . Skin:  no rash or induration seen on limited  exam . Musculoskeletal:  grossly normal tone BUE/BLE, good ROM, no bony abnormality . Psychiatric:  grossly normal mood and affect, speech fluent and appropriate, AOx3 . Neurologic:  CN 2-12 grossly intact, moves all extremities in coordinated fashion    Radiological Exams on Admission: Independently reviewed - see discussion in A/P where applicable  CT Renal Stone Study  Result Date: 05/13/2020 CLINICAL DATA:  Right hydronephrosis on right upper quadrant ultrasound. EXAM: CT ABDOMEN AND PELVIS WITHOUT CONTRAST TECHNIQUE: Multidetector CT imaging of the abdomen and pelvis was performed following the standard protocol without IV contrast. COMPARISON:  Abdominal ultrasound earlier today. Abdominal CT 04/19/2020. FINDINGS: Lower chest: Breathing motion artifact. Left basilar consolidation, slightly greater than typically seen with atelectasis. There is linear atelectasis in the right lower lobe and right middle lobe. Upper normal heart size with coronary artery calcifications. No pleural fluid. Hepatobiliary: Gallbladder distension. There is a dominant intraluminal gallstone measuring 3.4 cm. Additional small layering stones are seen. There is intra and extrahepatic biliary ductal dilatation with at least 3 small stones in the distal common bile duct measuring at least 8 mm. Minimal Peri cholecystic fat stranding. No focal hepatic lesion. Pancreas: No ductal dilatation or inflammation. Spleen: Normal in size without focal abnormality. Adrenals/Urinary Tract: No adrenal nodule. No hydronephrosis, particularly on the right. There are renal vascular calcifications without definite renal calculi. Symmetric perinephric edema is chronic. Ureters are decompressed. Urinary bladder is completely empty. There is mild perivesicular edema. Stomach/Bowel: Patulous distal esophagus. Decompressed stomach. No small bowel dilatation or obstruction. No evidence of small bowel inflammation. Appendix not confidently  visualized on the current exam. No evidence of appendicitis. Small volume of colonic stool. Mild distal colonic diverticulosis without diverticulitis. Vascular/Lymphatic: Advanced aortic and branch atherosclerosis. No aortic aneurysm. Infrarenal IVC filter in place. No abdominopelvic adenopathy. Reproductive: Prostate is unremarkable. Other: No ascites. No free air. Moderate-sized fat containing umbilical with small supraumbilical fat containing hernia. No bowel involvement or inflammation. Musculoskeletal: Stable chronic change at L5-L1 which may be postsurgical or degenerative. Vacuum phenomena at L2-L3. Prominent Schmorl's node inferior endplate of L3, unchanged. Generalized ground-glass density of the osseous structures consistent with renal osteodystrophy. 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 IMPRESSION: 1. Choledocholithiasis with small stones in the distal common bile duct measuring up to 8 mm and subsequent biliary ductal dilatation. Mild gallbladder distension and intraluminal gallstones. Mild pericholecystic inflammation. 2. No right hydronephrosis or stone. Symmetric perinephric edema which appears chronic. 3. Colonic diverticulosis without diverticulitis. 4. 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 5. Advanced atherosclerotic vascular disease. Aortic Atherosclerosis (ICD10-I70.0). Electronically Signed   By: Keith Rake [name]   On: 05/13/2020 17:00   US Abdomen Limited RUQ (LIVER/GB)  Result Date: 05/13/2020 CLINICAL DATA:  Cholelithiasis. EXAM: ULTRASOUND ABDOMEN LIMITED RIGHT UPPER QUADRANT COMPARISON:  April 19, 2020.  January 10, 2018. FINDINGS: Gallbladder: 2.8 cm gallstone is noted. No significant gallbladder wall thickening or pericholecystic fluid is noted. No sonographic Murphy's sign is noted. Common bile duct: Diameter: Measures 7 mm proximally and approximately 10 mm distally. Liver: No focal lesion identified. Mildly increased  echogenicity of hepatic parenchyma is noted suggesting hepatic steatosis. Portal vein is patent on color Doppler imaging with normal direction of blood flow towards the liver. Other: Possible right nephrolithiasis is noted with mild right hydronephrosis. IMPRESSION: Large solitary gallstone is noted without definite evidence of cholecystitis. Common  bile duct is within normal limits proximally, but measures approximately 10 mm distally consistent with mild dilatation. Correlation with liver function tests is recommended to rule out distal common bile duct obstruction. Increased echogenicity of hepatic parenchyma is noted suggesting hepatic steatosis. Possible right nephrolithiasis is noted with mild right hydronephrosis. Electronically Signed   By: Marijo Conception [name]   On: 05/13/2020 15:05    EKG: not done   Labs on Admission: I have personally reviewed the available labs and imaging studies at the time of the admission.  Pertinent labs:   Glucose 130 BUN 52/Creatinine 9.65/GFR 5 Normal LFTs WBC 5.8 Hgb 11.6   Assessment/Plan Principal Problem:   Cholelithiasis without obstruction Active Problems:   Hyperlipidemia   ESRD (end stage renal disease) (HCC)   Systolic heart failure (HCC)   Hypotension   Class 1 obesity due to excess calories with body mass index (BMI) of 33.0 to 33.9 in adult    Cholelithiasis, CBD diltation -Patient with recent RUQ/midepigastric pain -Imaging on 11/21 showed cholelithiasis with choledocholithiasis, multiple distal CBD stones and biliary duct dilatation (29mm) -He was referred to surgery with initial appointment in January but was offered a cancellation appointment today -From there, he was sent to the ER due to these abnormal CT findings -CT today shows cholelithiasis with choledocholithiasis, multiple distal CBD stones and improved biliary duct dilatation (now 8 mm) -US shows the same with 10 mm ductal dilatation -Normal LFTs so unlikely to have  current obstruction -GI consulted -Surgery consulted -MRCP ordered -Clear liquids, NPO after midnight in case of need for cholecystectomy -Fentanyl for pain, Zofran for nausea -Does not appear to have cholecystitis, no antibiotics for now -Will admit to med surg in anticipation of need for cholecysttitis  ESRD on HD -Patient on chronic TTS HD -Nephrology prn order set utilized -He does not appear to be volume overloaded or otherwise in need of acute HD -Nephrology has been notified that patient will need HD after MRCP -Continue Renavite, sevelamer  DM -Will check A1c -Continue Lantus -Cover with very sensitive-scale SSI  Hypotension -Continue Midodrine on HD days  HLD -He does not appear to be taking medications for this issue at this time  Chronic systolic CHF -h/o pericardial tamponade s/p window -11/20 echo with EF 50-55% and grade 2 diastolic dysfunction -Appears to be compensated at this time -Surgery has consulted cardiology for clearance in case surgery is needed  Afib/flutter -Rate controlled without medication -Hold Coumadin in case of need for surgery -Check INR tonight and in AM -Start heparin drip per pharmacy for now  OSA -Not on CPAP  Obesity Body mass index is 33.3 kg/m. -Weight loss should be encouraged -Outpatient PCP/bariatric medicine f/u encouraged     Note: This patient has been tested and is negative for the novel coronavirus COVID-19. The patient has been fully vaccinated against COVID-19.    DVT prophylaxis: Heparin drip Code Status:  Full - confirmed with patient Family Communication: None present Disposition Plan:  The patient is from: home  Anticipated d/c is to: home without [facility] services  Anticipated d/c date will depend on clinical response to treatment, but possibly as early as tomorrow if he has excellent response to treatment and surgery is not required  Patient is currently: acutely ill Consults called: Nephrology ;GI;  surgery; cardiology Admission status:  Admit - It is my clinical opinion that admission to INPATIENT is reasonable and necessary because of the expectation that this patient will require hospital care that crosses  at least 2 midnights to treat this condition based on the medical complexity of the problems presented.  Given the aforementioned information, the predictability of an adverse outcome is felt to be significant.     Karmen Bongo MD [facility]   How to contact the [facility] Attending or Consulting provider [place] or covering provider during after hours [place], for this patient?  1. Check the care team in [facility] and look for a) attending/consulting [facility] provider listed and b) the [facility] team listed 2. Log into [email or web address] and use [place]'s universal password to access. If you do not have the password, please contact the hospital operator. 3. Locate the [facility] provider you are looking for under [facility] and page to a number that you can be directly reached. 4. If you still have difficulty reaching the provider, please page the [facility] (Director on Call) for the Hospitalists listed on amion for assistance.   05/13/2020, 5:46 PM

## 2020-05-13 NOTE — ED Notes (Signed)
Pt states he is anuric and will be able to produce a urine sample

## 2020-05-13 NOTE — Progress Notes (Signed)
[facility] for Heparin Indication: atrial fibrillation  No Known Allergies  Patient Measurements: Height: 6' 3.5" (191.8 cm) Weight: 122.5 kg (270 lb) IBW/kg (Calculated) : 85.65 Heparin Dosing Weight: 111.7 kg  Vital Signs: Temp: 97.4 F (36.3 C) (12/13 1640) Temp Source: Oral (12/13 1640) BP: 121/86 (12/13 1815) Pulse Rate: 92 (12/13 1815)  Labs: Recent Labs    05/13/20 1017 05/13/20 1739  HGB 11.6*  --   HCT 37.1*  --   PLT 154  --   LABPROT  --  25.2*  INR  --  2.4*  CREATININE 9.65*  --     Estimated Creatinine Clearance: 10.5 mL/min (A) (by C-G formula based on SCr of 9.65 mg/dL (H)).   Medical History: Past Medical History:  Diagnosis Date  . Anemia   . Arthritis    HNP- lumbar, "all over my body"  . Blood transfusion    "years ago; blood was low" (08/05/2013)  . Diabetic nephropathy ([place])   . Diabetic retinopathy   . DVT (deep venous thrombosis) ([place])    "got one in my right leg now; I've had one before too, not sure which leg" (08/05/2013)  . ESRD (end stage renal disease) on dialysis ([place])    TTS, [place]  . Family history of anesthesia complication    " my son wakes up slowly"  . GERD (gastroesophageal reflux disease)    uses alka seltzere on occas.   [name])    "one q now and then" (08/05/2013)  . Hyperlipidemia   . Hypertension   . 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 Non Hodgkin's lymphoma ([place])    Tx 2009; "had chemo; it went away" (08/05/2013)  . Noncompliance 03/16/2012  . NSVT (nonsustained ventricular tachycardia) ([place]) 03/18/2012  . Peripheral vascular disease ([place])   . Poor historian    pt. unsure of several answers to health history questions   . Skin cancer    melanoma - head  . Sleep apnea    "suppose to have a sleep study, but they never told me when. (08/05/2013)    Medications:  Scheduled:  . [START  ON 05/14/2020] insulin aspart  0-6 Units Subcutaneous TID WC  . insulin glargine  10 Units Subcutaneous QHS  . [START ON 05/14/2020] iron polysaccharides  150 mg Oral Daily  . multivitamin  1 tablet Oral QHS  . [START ON 05/14/2020] sevelamer carbonate  800 mg Oral TID WC    Assessment: Patient is a 32 yom that is being admitted for Cholelithiasis, CBD dilatation. The patient is on warfarin at home for afib. At this time the patient's INR is therapeutic at 2.4. Pharmacy has been asked to dose heparin in this patient for afib while holding warfarin.    Goal of Therapy:  Heparin level 0.3-0.7 units/ml Monitor platelets by anticoagulation protocol: Yes   Plan:  - Patient's INR is currently therapeutic at 2.4 . - Plan to start heparin drip when patient's INR is < 2.0.  - Will f/u on morning INR to determine if heparin drip needs started  - Monitor patient for s/s of bleeding and CBC while on heparin   Duanne Limerick PharmD. BCPS  05/13/2020,7:11 PM

## 2020-05-13 NOTE — Consult Note (Addendum)
[facility] Surgery Consult Note  [name] 1950-09-09  [id number].    Requesting MD: Nanda Quinton Chief Complaint/Reason for Consult: gallstones  HPI:  [name] is a 69yo male with multiple medical problems including ESRD on HD T/Th/Sat, hypotension, IDDM, HLD, PVD, h/o DVT with filter on Coumadin, hx Non-Hodgkin's lymphoma 2009 s/p chemotherapy, non-ischemic cardiomyopathy with HF (improvd EF 50-55% on ECHO 04/2019), atrial fibrillation and h/o pericardial effusion s/p pericardial window 2019, who was sent to ED from [place] office today with possible choledocholithiasis. His nephrologist ordered a CT scan 04/19/20 which showed cholelithiasis, choledocholithiasis with multiple distal common bile duct stones and biliary ductal dilatation. He was referred to our office as an outpatient.  Patient states that he developed right sided abdominal and right flank pain about 3 weeks ago. The pain is intermittent. Nothing particularly makes the pain worse. Denies any nausea, vomiting, diarrhea, fever, or chills. He has been tolerating a regular diet. States that he has never had pain like this before. If he sits in his massage chair at home this somewhat helps. He does not make urine and therefore does not have any urinary symptoms. In the ED today he underwent u/s which shows large solitary gallstone without definite evidence of cholecystitis, common bile duct within normal limits proximally but measures 10 mm distally, possible right nephrolithiasis with mild right hydronephrosis. WBC, LFTs, and lipase WNL.  Abdominal surgical history: laparoscopic retroperitoneal lymph node biopsy 2012 Nonsmoker Denies alcohol or illicit drug use Lives at home independently, he has a brother who checks on him periodically and a CNA 5 days/week Uses walker for ambulation  Review of Systems  Constitutional: Negative.   Respiratory: Negative.   Cardiovascular: Negative.   Gastrointestinal: Positive for  abdominal pain and nausea. Negative for constipation, diarrhea and vomiting.  Genitourinary: Positive for flank pain.   All systems reviewed and otherwise negative except for as above  Family History  Problem Relation Age of Onset  . Anesthesia problems Son   . Hypertension Son   . Diabetes Father   . Hypertension Father   . Other Father        amputation  . Colon cancer Neg Hx     Past Medical History:  Diagnosis Date  . Anemia   . Arthritis    HNP- lumbar, "all over my body"  . Blood transfusion    "years ago; blood was low" (08/05/2013)  . CKD (chronic kidney disease) stage 4, GFR 15-29 ml/min (HCC) 03/18/2012   [place]- T,TH,Sat.  . Diabetic nephropathy ([place])   . Diabetic retinopathy   . DVT (deep venous thrombosis) ([place])    "got one in my right leg now; I've had one before too, not sure which leg" (08/05/2013)  . ESRD (end stage renal disease) on dialysis [facility])    "just started today, (08/04/2013)"  . Family history of anesthesia complication    " my son wakes up slowly"  . GERD (gastroesophageal reflux disease)    uses alka seltzere on occas.   [name])    "one q now and then" (08/05/2013)  . Hyperlipidemia   . Hypertension   . 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 Non Hodgkin's lymphoma ([place])    Tx 2009; "had chemo; it went away" (08/05/2013)  . Noncompliance 03/16/2012  . NSVT (nonsustained ventricular tachycardia) ([place]) 03/18/2012  . Peripheral vascular disease ([place])   .  Pneumonia 2013   hosp.-   . Poor historian    pt. unsure of several answers to health history questions   . Skin cancer    melanoma - head  . Sleep apnea    "suppose to have a sleep study, but they never told me when. (08/05/2013)    Past Surgical History:  Procedure Laterality Date  . ACHILLES TENDON SURGERY Right 03/13/2016   Procedure: ACHILLES LENGTHENING/KIDNER;  Surgeon: Edrick Kins,  DPM;  Location: [place];  Service: Podiatry;  Laterality: Right;  . AV FISTULA PLACEMENT Left 02/03/2013   Procedure: ARTERIOVENOUS (AV) FISTULA CREATION- LEFT RADIAL CEPHALIC; ULTRASOUND GUIDED;  Surgeon: Mal Misty, MD;  [facility];  Service: Vascular;  Laterality: Left;  . AV FISTULA PLACEMENT Right 11/08/2015   Procedure: RIGHT BRACHIOCEPHALIC ARTERIOVENOUS (AV) FISTULA CREATION;  Surgeon: Serafina Mitchell, MD;  Location: [place];  Service: Vascular;  Laterality: Right;  . BASCILIC VEIN TRANSPOSITION Right 01/24/2016   Procedure: RIGHT SECOND STAGE BASILIC VEIN TRANSPOSITION;  Surgeon: Angelia Mould, MD;  Location: [place];  Service: Vascular;  Laterality: Right;  . CARDIAC CATHETERIZATION    . COLONOSCOPY N/A 09/23/2015   Procedure: COLONOSCOPY;  Surgeon: Irene Shipper, MD;  Location: [facility];  Service: Endoscopy;  Laterality: N/A;  . Coloscopy    . EYE SURGERY Bilateral   . GAS INSERTION  05/10/2012   Procedure: INSERTION OF GAS;  Surgeon: Hayden Pedro, MD;  Location: [place];  Service: Ophthalmology;  Laterality: Right;  . LEFT HEART CATH AND CORONARY ANGIOGRAPHY N/A 09/11/2016   Procedure: Left Heart Cath and Coronary Angiography;  Surgeon: Belva Crome, MD;  Location: [place] CV LAB;  Service: Cardiovascular;  Laterality: N/A;  . LESION EXCISION Right 05/08/2015   Procedure: EXCISION SCALP LESION;  Surgeon: Erroll [place], MD;  Location: [place];  Service: General;  Laterality: Right;  . MEMBRANE PEEL  05/10/2012   Procedure: MEMBRANE PEEL;  Surgeon: Hayden Pedro, MD;  Location: [place];  Service: Ophthalmology;  Laterality: Right;  . PARS PLANA VITRECTOMY  08/27/2011   Procedure: PARS PLANA VITRECTOMY WITH 25 GAUGE;  Surgeon: Hayden Pedro, MD;  Location: [place];  Service: Ophthalmology;  Laterality: Left;  Repair of complex traction retinal detachment left eye  . PARS PLANA VITRECTOMY  05/10/2012   Procedure: PARS PLANA VITRECTOMY WITH 25 GAUGE;  Surgeon: Hayden Pedro,  MD;  Location: [place];  Service: Ophthalmology;  Laterality: Right;  Repair Complex Traction Retinal Detachment  . PHOTOCOAGULATION WITH LASER  05/10/2012   Procedure: PHOTOCOAGULATION WITH LASER;  Surgeon: Hayden Pedro, MD;  Location: [place];  Service: Ophthalmology;  Laterality: Right;  . PORT-A-CATH REMOVAL    . PORTACATH PLACEMENT    . SUBXYPHOID PERICARDIAL WINDOW N/A 01/10/2018   Procedure: SUBXYPHOID PERICARDIAL WINDOW;  Surgeon: Rexene Alberts, MD;  Location: [place];  Service: Thoracic;  Laterality: N/A;  . TRANSMETATARSAL AMPUTATION Right 03/13/2016   Procedure: TRANSMETATARSAL AMPUTATION;  Surgeon: Edrick Kins, DPM;  Location: [place];  Service: Podiatry;  Laterality: Right;  . VENA CAVA FILTER PLACEMENT  09/2012   due to preparation for surgery    Social History:  reports that he has never smoked. He has never used smokeless tobacco. He reports that he does not drink alcohol and does not use drugs.  Allergies: No Known Allergies  (Not in a hospital admission)   Prior to Admission medications   Medication Sig Start Date End Date  Taking? Authorizing Provider  acetaminophen (TYLENOL) 325 MG tablet Take 650 mg by mouth every 6 (six) hours as needed (pain).    [provider]  B Complex-C-Folic Acid (RENA-VITE PO) Take by mouth. 08/08/13   [provider]  Cholecalciferol (VITAMIN D3) 50000 units CAPS Take 1 capsule by mouth daily.    [provider]  insulin aspart (NOVOLOG) 100 UNIT/ML injection Inject 1-9 Units into the skin 3 (three) times daily before meals. 01/21/18   Domenic Polite, MD  insulin glargine (LANTUS) 100 UNIT/ML injection Inject 0.15 mLs (15 Units total) into the skin at bedtime. 03/17/16   Theodis Blaze, MD  lidocaine-prilocaine (EMLA) cream Apply 1 application topically as needed (Apply small amount to access site 1-2 hours before dialysis. Cover with occlusive dressing (saran wrap)).     [provider]  midodrine  (PROAMATINE) 5 MG tablet Take 5 mg by mouth 3 (three) times daily with meals. Take ONLY on DIALYSIS DAYS    [provider]  MULTIPLE VITAMINS-MINERALS PO Take by mouth. 1 tablet PO daily    [provider]  oseltamivir (TAMIFLU) 30 MG capsule TAKE 1 CAPSULE BY MOUTH TODAY THEN AFTER DIALYSIS ON SAT TUES THUR SAT TUES 07/29/18   [provider]  polysaccharide iron (NIFEREX) 150 MG CAPS capsule Take 1 capsule (150 mg total) by mouth daily. 01/16/11   Isaac Bliss, Rayford Halsted, MD  sevelamer carbonate (RENVELA) 800 MG tablet Take 1,600 mg by mouth 3 (three) times daily with meals.    [provider]  Skin Protectants, Misc. (MINERIN) CREA Apply topically. Topically every 12 hours    [provider]  traMADol (ULTRAM) 50 MG tablet Take 1 tablet (50 mg total) by mouth every 6 (six) hours as needed. 07/17/18   Orpah Greek, MD  vitamin C (ASCORBIC ACID) 500 MG tablet Take 500 mg by mouth 2 (two) times daily.    [provider]  warfarin (COUMADIN) 5 MG tablet TAKE 1 TABLET BY MOUTH ON TUES WEDS & FRI. TAKE 1 & 1 2 TABLETS MON THURS SAT & SUN 04/13/18   [provider]  zinc sulfate 220 (50 Zn) MG capsule Take 220 mg by mouth daily.    [provider]    Blood pressure (!) 174/102, pulse 98, temperature (!) 97.5 F (36.4 C), temperature source Oral, resp. rate 18, height 6' 3.5" (1.918 m), weight 122.5 kg, SpO2 100 %. Physical Exam: General: pleasant, WD/WN male who is laying in bed in NAD HEENT: head is normocephalic, atraumatic.  Sclera are noninjected.  PERRL.  Ears and nose without any masses or lesions.  Mouth is pink and moist. Dentition fair Heart: irregularly irregular. No obvious murmurs, gallops, or rubs noted.  Palpable pedal pulses bilaterally  Lungs: CTAB, no wheezes, rhonchi, or rales noted.  Respiratory effort nonlabored Abd: obese, well healed midline incision, soft, NT/ND, +BS, no masses, hernias, or  organomegaly MS: no BUE/BLE edema, calves soft and nontender, s/p transmetatarsal amputation on the right Skin: warm and dry with no masses, lesions, or rashes Psych: A&Ox4 with an appropriate affect Neuro: cranial nerves grossly intact, equal strength in BUE/BLE bilaterally, normal speech, thought process intact  Results for orders placed or performed during the hospital encounter of 05/13/20 (from the past 48 hour(s))  Lipase, blood     Status: None   Collection Time: 05/13/20 10:17 AM  Result Value Ref Range   Lipase 43 11 - 51 U/L    Comment: Performed  at [facility], [place] [address] [place], [place] [address]  Comprehensive metabolic panel     Status: Abnormal   Collection Time: 05/13/20 10:17 AM  Result Value Ref Range   Sodium 142 135 - 145 mmol/L   Potassium 4.3 3.5 - 5.1 mmol/L   Chloride 96 (L) 98 - 111 mmol/L   CO2 28 22 - 32 mmol/L   Glucose, Bld 130 (H) 70 - 99 mg/dL    Comment: Glucose reference range applies only to samples taken after fasting for at least 8 hours.   BUN 52 (H) 8 - 23 mg/dL   Creatinine, Ser 9.65 (H) 0.61 - 1.24 mg/dL   Calcium 8.4 (L) 8.9 - 10.3 mg/dL   Total Protein 7.2 6.5 - 8.1 g/dL   Albumin 3.4 (L) 3.5 - 5.0 g/dL   AST 24 15 - 41 U/L   ALT 27 0 - 44 U/L   Alkaline Phosphatase 121 38 - 126 U/L   Total Bilirubin 0.8 0.3 - 1.2 mg/dL   GFR, Estimated 5 (L) >60 mL/min    Comment: (NOTE) Calculated using the CKD-EPI Creatinine Equation (2021)    Anion gap 18 (H) 5 - 15    Comment: Performed at [facility], [place] [address], [place], [place] [address]  CBC     Status: Abnormal   Collection Time: 05/13/20 10:17 AM  Result Value Ref Range   WBC 5.8 4.0 - 10.5 K/uL   RBC 3.83 (L) 4.22 - 5.81 MIL/uL   Hemoglobin 11.6 (L) 13.0 - 17.0 g/dL   HCT 37.1 (L) 39.0 - 52.0 %   MCV 96.9 80.0 - 100.0 fL   MCH 30.3 26.0 - 34.0 pg   MCHC 31.3 30.0 - 36.0 g/dL   RDW 16.2 (H) 11.5 - 15.5 %   Platelets 154 150 - 400 K/uL   nRBC 0.0 0.0 - 0.2 %     Comment: Performed at [facility], [place] [address] [place], [place] [address]   US Abdomen Limited RUQ (LIVER/GB)  Result Date: 05/13/2020 CLINICAL DATA:  Cholelithiasis. EXAM: ULTRASOUND ABDOMEN LIMITED RIGHT UPPER QUADRANT COMPARISON:  April 19, 2020.  January 10, 2018. FINDINGS: Gallbladder: 2.8 cm gallstone is noted. No significant gallbladder wall thickening or pericholecystic fluid is noted. No sonographic Murphy's sign is noted. Common bile duct: Diameter: Measures 7 mm proximally and approximately 10 mm distally. Liver: No focal lesion identified. Mildly increased echogenicity of hepatic parenchyma is noted suggesting hepatic steatosis. Portal vein is patent on color Doppler imaging with normal direction of blood flow towards the liver. Other: Possible right nephrolithiasis is noted with mild right hydronephrosis. IMPRESSION: Large solitary gallstone is noted without definite evidence of cholecystitis. Common bile duct is within normal limits proximally, but measures approximately 10 mm distally consistent with mild dilatation. Correlation with liver function tests is recommended to rule out distal common bile duct obstruction. Increased echogenicity of hepatic parenchyma is noted suggesting hepatic steatosis. Possible right nephrolithiasis is noted with mild right hydronephrosis. Electronically Signed   By: Marijo Conception [name]   On: 05/13/2020 15:05      Assessment/Plan ESRD on HD T/Th/Sat Hypotension IDDM HLD PVD H/o DVT with filter on Coumadin Hx Non-Hodgkin's lymphoma 2009 s/p chemotherapy Non-ischemic cardiomyopathy with HF (improvd EF 50-55% on ECHO 04/2019) Atrial fibrillation H/o pericardial effusion s/p pericardial window 2019  Right sided abdominal/flank pain Cholelithiasis, ?choledocholithiasis ?Right nephrolithiasis with hydronephrosis - Patient is currently asymptomatic. Unsure as to the cause of  his abdominal pain but I think he warrants further work up.  Recommend medical admission. CT renal has been ordered to evaluate possible hydronephrosis. MRCP pending to evaluate for choledocholithiasis. Ok to have diet after MRCP from surgical standpoint. Please hold coumadin, ok for IV heparin if needed. Will ask cardiology to see for preoperative risk stratification in case he needs ERCP, cholecystectomy. We will follow.  Wellington Hampshire, [facility] 05/13/2020, 3:37 PM Please see Amion for pager number during day hours 7:00am-4:30pm

## 2020-05-13 NOTE — ED Provider Notes (Signed)
Care assumed from Providence Lanius at 3:00 PM. Please see her note for further details regarding initial workup of this pt.  Pt will be admitted to Hospitalist service with Nephrology following given pt's history ESRD on HD. Plan is for pt to undergo an MRCP per Gastroenterology recommendations.  On initial evaluation, pt resting comfortably with no acute concerns or complaints. No acute interventions required while under my care.   Vanna Scotland, MD 25/89/48 [id number]    Dorie Rank, MD 05/14/20 [phone]

## 2020-05-13 NOTE — ED Provider Notes (Signed)
[facility] EMERGENCY DEPARTMENT Provider Note   CSN: [id number] Arrival date & time: 05/13/20  6294     History No chief complaint on file.   [name] is a 69 y.o. male past medical history of end-stage renal disease (Tuesday, Thursday, Saturday dialysis), diabetes, GERD, hypertension who presents for evaluation of gallstones.  He was evaluated today by [facility] [place] [facility] on outpatient basis.  He was instructed to go to the emergency department for further evaluation.  Patient reports he had been having some right upper quadrant abdominal pain that has been intermittently occurring over the last 3 to 4 weeks.  He states that a few days before Thanksgiving, he got a scan and was told to follow-up with [place].  He had an appointment today and when he went, he was told to go to the emergency department for further evaluation.  He states he has had some intermittent abdominal pain but states that is currently not bothering him.  He will can occasionally have some nausea/vomiting.  No fevers.  His last dialysis session was on Saturday.  Denies any chest pain, difficulty breathing.   GI: [name]   The history is provided by the patient.       Past Medical History:  Diagnosis Date  . Anemia   . Arthritis    HNP- lumbar, "all over my body"  . Blood transfusion    "years ago; blood was low" (08/05/2013)  . CKD (chronic kidney disease) stage 4, GFR 15-29 ml/min (HCC) 03/18/2012   [place]- T,TH,Sat.  . Diabetic nephropathy ([place])   . Diabetic retinopathy   . DVT (deep venous thrombosis) ([place])    "got one in my right leg now; I've had one before too, not sure which leg" (08/05/2013)  . ESRD (end stage renal disease) on dialysis [facility])    "just started today, (08/04/2013)"  . Family history of anesthesia complication    " my son wakes up slowly"  . GERD (gastroesophageal reflux disease)    uses alka seltzere on occas.   [name])     "one q now and then" (08/05/2013)  . Hyperlipidemia   . Hypertension   . 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 Non Hodgkin's lymphoma ([place])    Tx 2009; "had chemo; it went away" (08/05/2013)  . Noncompliance 03/16/2012  . NSVT (nonsustained ventricular tachycardia) ([place]) 03/18/2012  . Peripheral vascular disease ([place])   . Pneumonia 2013   hosp.-   . Poor historian    pt. unsure of several answers to health history questions   . Skin cancer    melanoma - head  . Sleep apnea    "suppose to have a sleep study, but they never told me when. (08/05/2013)    Patient Active Problem List   Diagnosis Date Noted  . Malnutrition of moderate degree 01/17/2018  . Toxic metabolic encephalopathy 76/54/6503  . Elevated LFTs   . Idiopathic acute pancreatitis without infection or necrosis   . Acute respiratory failure with hypoxia ([place])   . Septic shock ([place])   . Cholecystitis   . Metabolic acidosis   . Pericardial effusion with cardiac tamponade   . Hypotension 01/09/2018  . Sepsis ([place]) 01/09/2018  . Skin cancer of scalp or skin of neck 11/11/2016  . Abnormal nuclear stress test 09/11/2016  . Systolic heart failure ([place])   . History of colonic  polyps   . Benign neoplasm of transverse colon   . ESRD (end stage renal disease) ([place]) 01/31/2013  . Chronic anticoagulation 12/27/2012  . Vitreous hemorrhage ([place]) 04/19/2012  . NSVT (nonsustained ventricular tachycardia) ([place]) 03/18/2012  . Noncompliance 03/16/2012  . Chest pain, no MI, negative myoview, most likely muscular sketal pain 03/15/2012  . Proliferative diabetic retinopathy associated with type 2 diabetes mellitus ([place]) 08/18/2011  . 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 Non Hodgkin's lymphoma ([place])   . Hyperlipidemia   . Diabetic nephropathy ([place])   . DVT (deep venous thrombosis) ([place])   . Healthcare-associated pneumonia 01/15/2011  . Anemia in  chronic kidney disease 01/12/2011  . Warfarin-induced coagulopathy ([place]) 01/12/2011  . History of DVT of lower extremity 01/12/2011  . ABDOMINAL PAIN -GENERALIZED 10/24/2008  . PERSONAL HX COLONIC POLYPS 10/24/2008    Past Surgical History:  Procedure Laterality Date  . ACHILLES TENDON SURGERY Right 03/13/2016   Procedure: ACHILLES LENGTHENING/KIDNER;  Surgeon: Edrick Kins, DPM;  Location: [place];  Service: Podiatry;  Laterality: Right;  . AV FISTULA PLACEMENT Left 02/03/2013   Procedure: ARTERIOVENOUS (AV) FISTULA CREATION- LEFT RADIAL CEPHALIC; ULTRASOUND GUIDED;  Surgeon: Mal Misty, MD;  [facility];  Service: Vascular;  Laterality: Left;  . AV FISTULA PLACEMENT Right 11/08/2015   Procedure: RIGHT BRACHIOCEPHALIC ARTERIOVENOUS (AV) FISTULA CREATION;  Surgeon: Serafina Mitchell, MD;  Location: [place];  Service: Vascular;  Laterality: Right;  . BASCILIC VEIN TRANSPOSITION Right 01/24/2016   Procedure: RIGHT SECOND STAGE BASILIC VEIN TRANSPOSITION;  Surgeon: Angelia Mould, MD;  Location: [place];  Service: Vascular;  Laterality: Right;  . CARDIAC CATHETERIZATION    . COLONOSCOPY N/A 09/23/2015   Procedure: COLONOSCOPY;  Surgeon: Irene Shipper, MD;  Location: [facility];  Service: Endoscopy;  Laterality: N/A;  . Coloscopy    . EYE SURGERY Bilateral   . GAS INSERTION  05/10/2012   Procedure: INSERTION OF GAS;  Surgeon: Hayden Pedro, MD;  Location: [place];  Service: Ophthalmology;  Laterality: Right;  . LEFT HEART CATH AND CORONARY ANGIOGRAPHY N/A 09/11/2016   Procedure: Left Heart Cath and Coronary Angiography;  Surgeon: Belva Crome, MD;  Location: [place] CV LAB;  Service: Cardiovascular;  Laterality: N/A;  . LESION EXCISION Right 05/08/2015   Procedure: EXCISION SCALP LESION;  Surgeon: Erroll Luna, MD;  Location: [place];  Service: General;  Laterality: Right;  . MEMBRANE PEEL  05/10/2012   Procedure: MEMBRANE PEEL;  Surgeon: Hayden Pedro, MD;  Location: [place];   Service: Ophthalmology;  Laterality: Right;  . PARS PLANA VITRECTOMY  08/27/2011   Procedure: PARS PLANA VITRECTOMY WITH 25 GAUGE;  Surgeon: Hayden Pedro, MD;  Location: [place];  Service: Ophthalmology;  Laterality: Left;  Repair of complex traction retinal detachment left eye  . PARS PLANA VITRECTOMY  05/10/2012   Procedure: PARS PLANA VITRECTOMY WITH 25 GAUGE;  Surgeon: Hayden Pedro, MD;  Location: [place];  Service: Ophthalmology;  Laterality: Right;  Repair Complex Traction Retinal Detachment  . PHOTOCOAGULATION WITH LASER  05/10/2012   Procedure: PHOTOCOAGULATION WITH LASER;  Surgeon: Hayden Pedro, MD;  Location: [place];  Service: Ophthalmology;  Laterality: Right;  . PORT-A-CATH REMOVAL    . PORTACATH PLACEMENT    . SUBXYPHOID PERICARDIAL WINDOW N/A 01/10/2018   Procedure: SUBXYPHOID PERICARDIAL WINDOW;  Surgeon: Rexene Alberts, MD;  Location: [place];  Service: Thoracic;  Laterality: N/A;  . TRANSMETATARSAL AMPUTATION Right 03/13/2016  Procedure: TRANSMETATARSAL AMPUTATION;  Surgeon: Edrick Kins, DPM;  Location: [place];  Service: Podiatry;  Laterality: Right;  . VENA CAVA FILTER PLACEMENT  09/2012   due to preparation for surgery       Family History  Problem Relation Age of Onset  . Anesthesia problems Son   . Hypertension Son   . Diabetes Father   . Hypertension Father   . Other Father        amputation  . Colon cancer Neg Hx     Social History   Tobacco Use  . Smoking status: Never Smoker  . Smokeless tobacco: Never Used  Substance Use Topics  . Alcohol use: No    Alcohol/week: 0.0 standard drinks  . Drug use: No    Home Medications Prior to Admission medications   Medication Sig Start Date End Date Taking? Authorizing Provider  acetaminophen (TYLENOL) 325 MG tablet Take 650 mg by mouth every 6 (six) hours as needed (pain).    [provider]  B Complex-C-Folic Acid (RENA-VITE PO) Take by mouth. 08/08/13   [provider]  Cholecalciferol  (VITAMIN D3) 50000 units CAPS Take 1 capsule by mouth daily.    [provider]  insulin aspart (NOVOLOG) 100 UNIT/ML injection Inject 1-9 Units into the skin 3 (three) times daily before meals. 01/21/18   Domenic Polite, MD  insulin glargine (LANTUS) 100 UNIT/ML injection Inject 0.15 mLs (15 Units total) into the skin at bedtime. 03/17/16   Theodis Blaze, MD  lidocaine-prilocaine (EMLA) cream Apply 1 application topically as needed (Apply small amount to access site 1-2 hours before dialysis. Cover with occlusive dressing (saran wrap)).     [provider]  midodrine (PROAMATINE) 5 MG tablet Take 5 mg by mouth 3 (three) times daily with meals. Take ONLY on DIALYSIS DAYS    [provider]  MULTIPLE VITAMINS-MINERALS PO Take by mouth. 1 tablet PO daily    [provider]  oseltamivir (TAMIFLU) 30 MG capsule TAKE 1 CAPSULE BY MOUTH TODAY THEN AFTER DIALYSIS ON SAT TUES THUR SAT TUES 07/29/18   [provider]  polysaccharide iron (NIFEREX) 150 MG CAPS capsule Take 1 capsule (150 mg total) by mouth daily. 01/16/11   Isaac Bliss, Rayford Halsted, MD  sevelamer carbonate (RENVELA) 800 MG tablet Take 1,600 mg by mouth 3 (three) times daily with meals.    [provider]  Skin Protectants, Misc. (MINERIN) CREA Apply topically. Topically every 12 hours    [provider]  traMADol (ULTRAM) 50 MG tablet Take 1 tablet (50 mg total) by mouth every 6 (six) hours as needed. 07/17/18   Orpah Greek, MD  vitamin C (ASCORBIC ACID) 500 MG tablet Take 500 mg by mouth 2 (two) times daily.    [provider]  warfarin (COUMADIN) 5 MG tablet TAKE 1 TABLET BY MOUTH ON TUES WEDS & FRI. TAKE 1 & 1 2 TABLETS MON THURS SAT & SUN 04/13/18   [provider]  zinc sulfate 220 (50 Zn) MG capsule Take 220 mg by mouth daily.    [provider]    Allergies    Patient has no known allergies.  Review of Systems   Review of Systems   Constitutional: Negative for fever.  Respiratory: Negative for cough and shortness of breath.   Cardiovascular: Negative for chest pain.  Gastrointestinal: Positive for abdominal pain, nausea and vomiting.  Genitourinary: Negative for dysuria and hematuria.  Neurological: Negative for headaches.  All  other systems reviewed and are negative.   Physical Exam Updated Vital Signs BP 122/67 (BP Location: Left Arm)   Pulse 61   Temp 98.1 F (36.7 C) (Oral)   Resp 20   Ht 6' 3.5" (1.918 m)   Wt 122.5 kg   SpO2 99%   BMI 33.30 kg/m   Physical Exam Vitals and nursing note reviewed.  Constitutional:      Appearance: Normal appearance. He is well-developed and well-nourished.  HENT:     Head: Normocephalic and atraumatic.     Mouth/Throat:     Mouth: Oropharynx is clear and moist and mucous membranes are normal.  Eyes:     General: Lids are normal.     Extraocular Movements: EOM normal.     Conjunctiva/sclera: Conjunctivae normal.     Pupils: Pupils are equal, round, and reactive to light.  Cardiovascular:     Rate and Rhythm: Normal rate and regular rhythm.     Pulses: Normal pulses.     Heart sounds: Normal heart sounds. No murmur heard. No friction rub. No gallop.      Comments: Fistula noted on RUE. Slow thrill.  Pulmonary:     Effort: Pulmonary effort is normal.     Breath sounds: Normal breath sounds.  Abdominal:     Palpations: Abdomen is soft. Abdomen is not rigid.     Tenderness: There is no abdominal tenderness. There is no guarding.     Comments: Abdomen is soft, non-distended, non-tender. No rigidity, No guarding. No peritoneal signs.  Musculoskeletal:        General: Normal range of motion.     Cervical back: Full passive range of motion without pain.  Skin:    General: Skin is warm and dry.     Capillary Refill: Capillary refill takes less than 2 seconds.  Neurological:     Mental Status: He is alert and oriented to person, place, and time.  Psychiatric:         Mood and Affect: Mood and affect normal.        Speech: Speech normal.     ED Results / Procedures / Treatments   Labs (all labs ordered are listed, but only abnormal results are displayed) Labs Reviewed  COMPREHENSIVE METABOLIC PANEL - Abnormal; Notable for the following components:      Result Value   Chloride 96 (*)    Glucose, Bld 130 (*)    BUN 52 (*)    Creatinine, Ser 9.65 (*)    Calcium 8.4 (*)    Albumin 3.4 (*)    GFR, Estimated 5 (*)    Anion gap 18 (*)    All other components within normal limits  CBC - Abnormal; Notable for the following components:   RBC 3.83 (*)    Hemoglobin 11.6 (*)    HCT 37.1 (*)    RDW 16.2 (*)    All other components within normal limits  LIPASE, BLOOD  URINALYSIS, ROUTINE W REFLEX MICROSCOPIC    EKG None  Radiology No results found.  Procedures Procedures (including critical care time)  Medications Ordered in ED Medications - No data to display  ED Course  I have reviewed the triage vital signs and the nursing notes.  Pertinent labs & imaging results that were available during my care of the patient were reviewed by me and considered in my medical decision making (see chart for details).    MDM Rules/Calculators/A&P  69 year old male who presents for evaluation of intermittent right upper quadrant abdominal pain.  Was seen by outpatient GEN surge this morning and was told to go to the emergency department for further evaluation.  Currently denies any pain.  Initially arrival, he is afebrile, nontoxic-appearing.  Vital signs are stable.  Benign abdominal exam.  Labs ordered at triage.  Will obtain right upper quadrant ultrasound.  I reviewed patient's imaging from 04/21/2020.  At the time, showed cholelithiasis.  There was also concern about choledocholithiasis with multiple distal common bile duct stones and biliary ductal dilatation.  His common bile duct was dilated at 14 mm.  CBC shows no  leukocytosis.  Hemoglobin stable 11.6.  Lipase is unremarkable.  CMP shows BUN of 52, creatinine 9.65.  LFTs are within normal limits.  Discussed patient with Jerene Pitch (General Surgery PA) who reviewed outpatient records from today. Patient seen by Dr. Ninfa Linden and reviewed previous CT scan. Given his comorbidities and concern for possible choledocholithiasis, he was told to go to the ER for further evaluation for possible admission, GI consult.  Patient also will need cardiology, nephrology consult.  GEN surge will plan to consult on patient.  US shows large solitary gallstone noted.  No definite evidence of cholecystitis.  Common bile duct shows that the distal end measures approximately 10 mm with mild dilatation.  Discussed patient with Azucena Freed [facility] PA).  Given common bile duct dilatation, patient would need an MRCP.  I discussed with Brooke (general surgery).  She reviewed the ultrasound.  At this time, no obvious cholecystitis.  She recommends obtaining a CT renal study to see if there is any kidney stone given the slight hydronephrosis seen on ultrasound.  She recommends internal medicine admission so that he can get nephrology, cardiology consults for potential surgery.  GEN surge will plan to consult.  Discussed with tech. Patient will need dialysis after MRCP or to get it without contrast.   Discussed patient with Dr. Lorin Mercy (hospitalist) who accepts patient for admission.   Portions of this note were generated with [employer]. Dictation errors may occur despite best attempts at proofreading.   Final Clinical Impression(s) / ED Diagnoses Final diagnoses:  Gallstones    Rx / DC Orders ED Discharge Orders    None       Volanda Napoleon, PA-C 05/14/20 1517    Margette Fast, MD 05/15/20 1244

## 2020-05-13 NOTE — ED Triage Notes (Signed)
Pt sent from [facility] for further eval of gallstones. Having R sided abdominal pain x 3-4 weeks. Denies n/v/d. Had gallbladder surgery scheduled for 1/10.

## 2020-05-13 NOTE — Consult Note (Addendum)
CONSULTATION NOTE   Patient Name: LANI MENDIOLA Date of Encounter: 05/13/2020 Cardiologist: Pixie Casino, MD  Chief Complaint   No complaints  Patient Profile   69 yo male with remote history of NICM, LVEF 30-35% (improved to 50-55% in 2020), PAF/flutter, history of tamponade s/p pericardial window for hemorrhagic effusion, ESRD on HD, DM2, HTN, dyslipidemia and no significant CAD by cath in 2019, presents with abdominal pain and found to have possible biliary source. I was asked to evaluate for preoperative risk.  HPI   KOKI BUXTON is a 69 y.o. male who is being seen today for the evaluation of preoperative risk at the request of Dr. Ninfa Linden. This is a pleasant 69 year old male patient of mine with a history of nonischemic cardiomyopathy with EF 30 to 35% improved to 5055% in 2020, PAF/atrial flutter on warfarin, history of tamponade with a pericardial window, end-stage renal disease on hemodialysis, hypertension, dyslipidemia and no known coronary disease by cath in 2019, last seen by me in December 2020.  At the time he was considering pursuing a renal transplant.  He noted significant improvement in LV function and denied any chest pain or worsening shortness of breath.  He now presents for flank pain evaluation.  Had right upper quadrant / left flank pain that was on and off for several weeks and had an appointment with surgery.  He had ultrasound which showed a large gallstone and was sent by surgery to the emergency department for admission and inpatient work-up for possible surgery.  Cardiology was consulted regarding risk stratification. He denies any chest pain or worsening dyspnea.  PMHx   Past Medical History:  Diagnosis Date  . Anemia   . Arthritis    HNP- lumbar, "all over my body"  . Blood transfusion    "years ago; blood was low" (08/05/2013)  . Diabetic nephropathy ([place])   . Diabetic retinopathy   . DVT (deep venous thrombosis) ([place])    "got one in my right leg  now; I've had one before too, not sure which leg" (08/05/2013)  . ESRD (end stage renal disease) on dialysis ([place])    TTS, [place]  . Family history of anesthesia complication    " my son wakes up slowly"  . GERD (gastroesophageal reflux disease)    uses alka seltzere on occas.   [name])    "one q now and then" (08/05/2013)  . Hyperlipidemia   . Hypertension   . 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 Non Hodgkin's lymphoma ([place])    Tx 2009; "had chemo; it went away" (08/05/2013)  . Noncompliance 03/16/2012  . NSVT (nonsustained ventricular tachycardia) ([place]) 03/18/2012  . Peripheral vascular disease ([place])   . Poor historian    pt. unsure of several answers to health history questions   . Skin cancer    melanoma - head  . Sleep apnea    "suppose to have a sleep study, but they never told me when. (08/05/2013)    Past Surgical History:  Procedure Laterality Date  . ACHILLES TENDON SURGERY Right 03/13/2016   Procedure: ACHILLES LENGTHENING/KIDNER;  Surgeon: Edrick Kins, DPM;  Location: [facility];  Service: Podiatry;  Laterality: Right;  . AV FISTULA PLACEMENT Left 02/03/2013   Procedure: ARTERIOVENOUS (AV) FISTULA CREATION- LEFT RADIAL CEPHALIC; ULTRASOUND GUIDED;  Surgeon: Mal Misty, MD;  Location: [facility];  Service: Vascular;  Laterality: Left;  .  AV FISTULA PLACEMENT Right 11/08/2015   Procedure: RIGHT BRACHIOCEPHALIC ARTERIOVENOUS (AV) FISTULA CREATION;  Surgeon: Serafina Mitchell, MD;  Location: [facility];  Service: Vascular;  Laterality: Right;  . BASCILIC VEIN TRANSPOSITION Right 01/24/2016   Procedure: RIGHT SECOND STAGE BASILIC VEIN TRANSPOSITION;  Surgeon: Angelia Mould, MD;  Location: [facility];  Service: Vascular;  Laterality: Right;  . CARDIAC CATHETERIZATION    . COLONOSCOPY N/A 09/23/2015   Procedure: COLONOSCOPY;  Surgeon: Irene Shipper, MD;  [facility];  Service: Endoscopy;   Laterality: N/A;  . Coloscopy    . EYE SURGERY Bilateral   . GAS INSERTION  05/10/2012   Procedure: INSERTION OF GAS;  Surgeon: Hayden Pedro, MD;  Location: [facility];  Service: Ophthalmology;  Laterality: Right;  . LEFT HEART CATH AND CORONARY ANGIOGRAPHY N/A 09/11/2016   Procedure: Left Heart Cath and Coronary Angiography;  Surgeon: Belva Crome, MD;  Location: [facility];  Service: Cardiovascular;  Laterality: N/A;  . LESION EXCISION Right 05/08/2015   Procedure: EXCISION SCALP LESION;  Surgeon: Erroll Luna, MD;  Location: [facility];  Service: General;  Laterality: Right;  . MEMBRANE PEEL  05/10/2012   Procedure: MEMBRANE PEEL;  Surgeon: Hayden Pedro, MD;  Location: [facility];  Service: Ophthalmology;  Laterality: Right;  . PARS PLANA VITRECTOMY  08/27/2011   Procedure: PARS PLANA VITRECTOMY WITH 25 GAUGE;  Surgeon: Hayden Pedro, MD;  Location: [facility];  Service: Ophthalmology;  Laterality: Left;  Repair of complex traction retinal detachment left eye  . PARS PLANA VITRECTOMY  05/10/2012   Procedure: PARS PLANA VITRECTOMY WITH 25 GAUGE;  Surgeon: Hayden Pedro, MD;  Location: [facility];  Service: Ophthalmology;  Laterality: Right;  Repair Complex Traction Retinal Detachment  . PHOTOCOAGULATION WITH LASER  05/10/2012   Procedure: PHOTOCOAGULATION WITH LASER;  Surgeon: Hayden Pedro, MD;  Location: [facility];  Service: Ophthalmology;  Laterality: Right;  . PORT-A-CATH REMOVAL    . PORTACATH PLACEMENT    . SUBXYPHOID PERICARDIAL WINDOW N/A 01/10/2018   Procedure: SUBXYPHOID PERICARDIAL WINDOW;  Surgeon: Rexene Alberts, MD;  Location: [facility];  Service: Thoracic;  Laterality: N/A;  . TRANSMETATARSAL AMPUTATION Right 03/13/2016   Procedure: TRANSMETATARSAL AMPUTATION;  Surgeon: Edrick Kins, DPM;  Location: [facility];  Service: Podiatry;  Laterality: Right;  . VENA CAVA FILTER PLACEMENT  09/2012   due to preparation for surgery    FAMHx   Family History  Problem Relation Age of Onset  .  Anesthesia problems Son   . Hypertension Son   . Diabetes Father   . Hypertension Father   . Other Father        amputation  . Colon cancer Neg Hx     SOCHx    reports that he has never smoked. He has never used smokeless tobacco. He reports that he does not drink alcohol and does not use drugs.  Outpatient Medications   No current facility-administered medications on file prior to encounter.   Current Outpatient Medications on File Prior to Encounter  Medication Sig Dispense Refill  . acetaminophen (TYLENOL) 325 MG tablet Take 650 mg by mouth every 6 (six) hours as needed (pain).    . B Complex-C-Folic Acid (RENA-VITE PO) Take by mouth.    . Cholecalciferol (VITAMIN D3) 50000 units CAPS Take 1 capsule by mouth daily.    . insulin aspart (NOVOLOG) 100 UNIT/ML injection Inject 1-9 Units into the skin 3 (three) times daily before meals.    Marland Kitchen  insulin glargine (LANTUS) 100 UNIT/ML injection Inject 0.15 mLs (15 Units total) into the skin at bedtime. 10 mL 11  . lidocaine-prilocaine (EMLA) cream Apply 1 application topically as needed (Apply small amount to access site 1-2 hours before dialysis. Cover with occlusive dressing (saran wrap)).     . midodrine (PROAMATINE) 5 MG tablet Take 5 mg by mouth 3 (three) times daily with meals. Take ONLY on DIALYSIS DAYS    . MULTIPLE VITAMINS-MINERALS PO Take by mouth. 1 tablet PO daily    . oseltamivir (TAMIFLU) 30 MG capsule TAKE 1 CAPSULE BY MOUTH TODAY THEN AFTER DIALYSIS ON SAT TUES THUR SAT TUES    . polysaccharide iron (NIFEREX) 150 MG CAPS capsule Take 1 capsule (150 mg total) by mouth daily. 30 each 2  . sevelamer carbonate (RENVELA) 800 MG tablet Take 1,600 mg by mouth 3 (three) times daily with meals.    . Skin Protectants, Misc. (MINERIN) CREA Apply topically. Topically every 12 hours    . traMADol (ULTRAM) 50 MG tablet Take 1 tablet (50 mg total) by mouth every 6 (six) hours as needed. 10 tablet 0  . vitamin C (ASCORBIC ACID) 500 MG tablet  Take 500 mg by mouth 2 (two) times daily.    Marland Kitchen warfarin (COUMADIN) 5 MG tablet TAKE 1 TABLET BY MOUTH ON TUES WEDS & FRI. TAKE 1 & 1 2 TABLETS MON THURS SAT & SUN    . zinc sulfate 220 (50 Zn) MG capsule Take 220 mg by mouth daily.      Inpatient Medications    Scheduled Meds:   Continuous Infusions:   PRN Meds:    ALLERGIES   No Known Allergies  ROS   Pertinent items noted in HPI and remainder of comprehensive ROS otherwise negative.  Vitals   Vitals:   05/13/20 1143 05/13/20 1345 05/13/20 1445 05/13/20 1640  BP: 122/67 (!) 179/91 (!) 174/102 (!) 181/100  Pulse: 61 78 98 95  Resp: 20 19 18 18   Temp:  97.8 F (36.6 C) (!) 97.5 F (36.4 C) (!) 97.4 F (36.3 C)  TempSrc:   Oral Oral  SpO2: 99% 95% 100% 96%  Weight:      Height:       No intake or output data in the 24 hours ending 05/13/20 1709 Filed Weights   05/13/20 1000  Weight: 122.5 kg    Physical Exam   General appearance: alert and no distress Neck: no carotid bruit, no JVD and thyroid not enlarged, symmetric, no tenderness/mass/nodules Lungs: clear to auscultation bilaterally Heart: irregularly irregular rhythm Abdomen: soft, non-tender; bowel sounds normal; no masses,  no organomegaly Extremities: Status post right TMA Pulses: 2+ and symmetric Skin: Skin color, texture, turgor normal. No rashes or lesions Neurologic: Grossly normal Psych: Pleasant  Labs   Results for orders placed or performed during the hospital encounter of 05/13/20 (from the past 48 hour(s))  Lipase, blood     Status: None   Collection Time: 05/13/20 10:17 AM  Result Value Ref Range   Lipase 43 11 - 51 U/L    Comment: Performed at [facility], [address] [place], [place] [address]  Comprehensive metabolic panel     Status: Abnormal   Collection Time: 05/13/20 10:17 AM  Result Value Ref Range   Sodium 142 135 - 145 mmol/L   Potassium 4.3 3.5 - 5.1 mmol/L   Chloride 96 (L) 98 - 111 mmol/L   CO2 28 22 - 32  mmol/L  Glucose, Bld 130 (H) 70 - 99 mg/dL    Comment: Glucose reference range applies only to samples taken after fasting for at least 8 hours.   BUN 52 (H) 8 - 23 mg/dL   Creatinine, Ser 9.65 (H) 0.61 - 1.24 mg/dL   Calcium 8.4 (L) 8.9 - 10.3 mg/dL   Total Protein 7.2 6.5 - 8.1 g/dL   Albumin 3.4 (L) 3.5 - 5.0 g/dL   AST 24 15 - 41 U/L   ALT 27 0 - 44 U/L   Alkaline Phosphatase 121 38 - 126 U/L   Total Bilirubin 0.8 0.3 - 1.2 mg/dL   GFR, Estimated 5 (L) >60 mL/min    Comment: (NOTE) Calculated using the CKD-EPI Creatinine Equation (2021)    Anion gap 18 (H) 5 - 15    Comment: Performed at [facility], [place] [address], [place], [place] [address]  CBC     Status: Abnormal   Collection Time: 05/13/20 10:17 AM  Result Value Ref Range   WBC 5.8 4.0 - 10.5 K/uL   RBC 3.83 (L) 4.22 - 5.81 MIL/uL   Hemoglobin 11.6 (L) 13.0 - 17.0 g/dL   HCT 37.1 (L) 39.0 - 52.0 %   MCV 96.9 80.0 - 100.0 fL   MCH 30.3 26.0 - 34.0 pg   MCHC 31.3 30.0 - 36.0 g/dL   RDW 16.2 (H) 11.5 - 15.5 %   Platelets 154 150 - 400 K/uL   nRBC 0.0 0.0 - 0.2 %    Comment: Performed at [facility], [place] [address], [place], [place] [address]    ECG   None  Telemetry   N/A  Radiology   CT Renal Stone Study  Result Date: 05/13/2020 CLINICAL DATA:  Right hydronephrosis on right upper quadrant ultrasound. EXAM: CT ABDOMEN AND PELVIS WITHOUT CONTRAST TECHNIQUE: Multidetector CT imaging of the abdomen and pelvis was performed following the standard protocol without IV contrast. COMPARISON:  Abdominal ultrasound earlier today. Abdominal CT 04/19/2020. FINDINGS: Lower chest: Breathing motion artifact. Left basilar consolidation, slightly greater than typically seen with atelectasis. There is linear atelectasis in the right lower lobe and right middle lobe. Upper normal heart size with coronary artery calcifications. No pleural fluid. Hepatobiliary: Gallbladder distension. There is a dominant intraluminal  gallstone measuring 3.4 cm. Additional small layering stones are seen. There is intra and extrahepatic biliary ductal dilatation with at least 3 small stones in the distal common bile duct measuring at least 8 mm. Minimal Peri cholecystic fat stranding. No focal hepatic lesion. Pancreas: No ductal dilatation or inflammation. Spleen: Normal in size without focal abnormality. Adrenals/Urinary Tract: No adrenal nodule. No hydronephrosis, particularly on the right. There are renal vascular calcifications without definite renal calculi. Symmetric perinephric edema is chronic. Ureters are decompressed. Urinary bladder is completely empty. There is mild perivesicular edema. Stomach/Bowel: Patulous distal esophagus. Decompressed stomach. No small bowel dilatation or obstruction. No evidence of small bowel inflammation. Appendix not confidently visualized on the current exam. No evidence of appendicitis. Small volume of colonic stool. Mild distal colonic diverticulosis without diverticulitis. Vascular/Lymphatic: Advanced aortic and branch atherosclerosis. No aortic aneurysm. Infrarenal IVC filter in place. No abdominopelvic adenopathy. Reproductive: Prostate is unremarkable. Other: No ascites. No free air. Moderate-sized fat containing umbilical with small supraumbilical fat containing hernia. No bowel involvement or inflammation. Musculoskeletal: Stable chronic change at L5-L1 which may be postsurgical or degenerative. Vacuum phenomena at L2-L3. Prominent Schmorl's node inferior endplate of L3, unchanged. Generalized ground-glass density of the osseous  structures consistent with renal osteodystrophy. 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 IMPRESSION: 1. Choledocholithiasis with small stones in the distal common bile duct measuring up to 8 mm and subsequent biliary ductal dilatation. Mild gallbladder distension and intraluminal gallstones. Mild pericholecystic inflammation. 2. No right hydronephrosis  or stone. Symmetric perinephric edema which appears chronic. 3. Colonic diverticulosis without diverticulitis. 4. 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 5. Advanced atherosclerotic vascular disease. Aortic Atherosclerosis (ICD10-I70.0). Electronically Signed   By: Keith Rake [name]   On: 05/13/2020 17:00   US Abdomen Limited RUQ (LIVER/GB)  Result Date: 05/13/2020 CLINICAL DATA:  Cholelithiasis. EXAM: ULTRASOUND ABDOMEN LIMITED RIGHT UPPER QUADRANT COMPARISON:  April 19, 2020.  January 10, 2018. FINDINGS: Gallbladder: 2.8 cm gallstone is noted. No significant gallbladder wall thickening or pericholecystic fluid is noted. No sonographic Murphy's sign is noted. Common bile duct: Diameter: Measures 7 mm proximally and approximately 10 mm distally. Liver: No focal lesion identified. Mildly increased echogenicity of hepatic parenchyma is noted suggesting hepatic steatosis. Portal vein is patent on color Doppler imaging with normal direction of blood flow towards the liver. Other: Possible right nephrolithiasis is noted with mild right hydronephrosis. IMPRESSION: Large solitary gallstone is noted without definite evidence of cholecystitis. Common bile duct is within normal limits proximally, but measures approximately 10 mm distally consistent with mild dilatation. Correlation with liver function tests is recommended to rule out distal common bile duct obstruction. Increased echogenicity of hepatic parenchyma is noted suggesting hepatic steatosis. Possible right nephrolithiasis is noted with mild right hydronephrosis. Electronically Signed   By: Marijo Conception [name]   On: 05/13/2020 15:05    Cardiac Studies   N/A  Impression   1. Active Problems: 2.   Cholelithiasis without obstruction 3. preoperative cardiovascular risk assessment 4. PAF on warfarin 5. ESRD on HD 6. Hypertension  Recommendation   1. Mr. Orihuela is at acceptable risk for upcoming surgery if necessary.  He  will need of course to hold warfarin for up to 5 days with transition to IV heparin per pharmacy.  He denies any anginal symptoms or worsening shortness of breath.  LVEF is near normal based on echo last year.  No indications for further testing at this time.  Blood pressure has been well controlled but is elevated today likely since he did not take his medications today.  Thanks for the consultation.  Time Spent Directly with Patient:  I have spent a total of 45 minutes with the patient reviewing hospital notes, telemetry, EKGs, labs and examining the patient as well as establishing an assessment and plan that was discussed personally with the patient.  > 50% of time was spent in direct patient care.  Length of Stay:  LOS: 0 days   Pixie Casino, MD, [facility], [place] Director of the Advanced Lipid Disorders &  Cardiovascular Risk Reduction Clinic Diplomate of the American Board of Clinical Lipidology Attending Cardiologist  Direct Dial: [phone]  Fax: [phone]  Website:  [email or web address] 05/13/2020, 5:09 PM

## 2020-05-14 ENCOUNTER — Encounter (HOSPITAL_COMMUNITY): Payer: Self-pay | Admitting: Internal Medicine

## 2020-05-14 DIAGNOSIS — K8050 Calculus of bile duct without cholangitis or cholecystitis without obstruction: Secondary | ICD-10-CM

## 2020-05-14 DIAGNOSIS — N186 End stage renal disease: Secondary | ICD-10-CM

## 2020-05-14 DIAGNOSIS — Z7901 Long term (current) use of anticoagulants: Secondary | ICD-10-CM

## 2020-05-14 DIAGNOSIS — E785 Hyperlipidemia, unspecified: Secondary | ICD-10-CM

## 2020-05-14 DIAGNOSIS — K8020 Calculus of gallbladder without cholecystitis without obstruction: Secondary | ICD-10-CM

## 2020-05-14 LAB — CBC
HCT: 39.3 % (ref 39.0–52.0)
Hemoglobin: 12.5 g/dL — ABNORMAL LOW (ref 13.0–17.0)
MCH: 30.3 pg (ref 26.0–34.0)
MCHC: 31.8 g/dL (ref 30.0–36.0)
MCV: 95.4 fL (ref 80.0–100.0)
Platelets: 135 10*3/uL — ABNORMAL LOW (ref 150–400)
RBC: 4.12 MIL/uL — ABNORMAL LOW (ref 4.22–5.81)
RDW: 16.3 % — ABNORMAL HIGH (ref 11.5–15.5)
WBC: 5.8 10*3/uL (ref 4.0–10.5)
nRBC: 0 % (ref 0.0–0.2)

## 2020-05-14 LAB — COMPREHENSIVE METABOLIC PANEL
ALT: 26 U/L (ref 0–44)
AST: 28 U/L (ref 15–41)
Albumin: 3.3 g/dL — ABNORMAL LOW (ref 3.5–5.0)
Alkaline Phosphatase: 134 U/L — ABNORMAL HIGH (ref 38–126)
Anion gap: 18 — ABNORMAL HIGH (ref 5–15)
BUN: 60 mg/dL — ABNORMAL HIGH (ref 8–23)
CO2: 24 mmol/L (ref 22–32)
Calcium: 8.3 mg/dL — ABNORMAL LOW (ref 8.9–10.3)
Chloride: 99 mmol/L (ref 98–111)
Creatinine, Ser: 11.08 mg/dL — ABNORMAL HIGH (ref 0.61–1.24)
GFR, Estimated: 5 mL/min — ABNORMAL LOW (ref >60)
Glucose, Bld: 73 mg/dL (ref 70–99)
Potassium: 4.7 mmol/L (ref 3.5–5.1)
Sodium: 141 mmol/L (ref 135–145)
Total Bilirubin: 1.3 mg/dL — ABNORMAL HIGH (ref 0.3–1.2)
Total Protein: 7 g/dL (ref 6.5–8.1)

## 2020-05-14 LAB — PROTIME-INR
INR: 2.3 — ABNORMAL HIGH (ref 0.8–1.2)
Prothrombin Time: 24.9 seconds — ABNORMAL HIGH (ref 11.4–15.2)

## 2020-05-14 LAB — MRSA PCR SCREENING: MRSA by PCR: NEGATIVE

## 2020-05-14 LAB — CBG MONITORING, ED
Glucose-Capillary: 64 mg/dL — ABNORMAL LOW (ref 70–99)
Glucose-Capillary: 75 mg/dL (ref 70–99)

## 2020-05-14 LAB — GLUCOSE, CAPILLARY
Glucose-Capillary: 113 mg/dL — ABNORMAL HIGH (ref 70–99)
Glucose-Capillary: 144 mg/dL — ABNORMAL HIGH (ref 70–99)

## 2020-05-14 MED ORDER — DEXTROSE-NACL 5-0.45 % IV SOLN: INTRAVENOUS | Status: DC

## 2020-05-14 MED ORDER — DOXERCALCIFEROL 4 MCG/2ML IV SOLN
5.0000 ug | INTRAVENOUS | Status: DC
  Administered 2020-05-16 – 2020-05-23 (×2): 5 ug via INTRAVENOUS
  Filled 2020-05-14 (×4): qty 4

## 2020-05-14 MED ORDER — DOXERCALCIFEROL 4 MCG/2ML IV SOLN
9.0000 ug | INTRAVENOUS | Status: DC
Start: 2020-05-16 — End: 2020-05-14

## 2020-05-14 MED ORDER — CINACALCET HCL 30 MG PO TABS
30.0000 mg | ORAL_TABLET | Freq: Two times a day (BID) | ORAL | Status: DC
  Administered 2020-05-14 – 2020-05-20 (×10): 30 mg via ORAL
  Filled 2020-05-14 (×11): qty 1

## 2020-05-14 NOTE — ED Notes (Signed)
Lunch Tray Ordered @ 1048. °

## 2020-05-14 NOTE — Consult Note (Addendum)
[facility] Consult: 9:18 AM 05/14/2020  LOS: 1 day    Referring Provider: Lorenda Cahill from genaral surgery  Primary Care Physician:  Burnard Bunting, MD Primary Gastroenterologist:  Dr. [name]     Reason for Consultation:  Choledocholithiasis.     HPI: [name] is a 69 y.o. male.  PMH ESRD.  Hemodialysis TTS.  IDDM.  Non-Hodgkin's lymphoma 2009, treated with chemo.  Anemia on oral iron supplement.  Previous blood transfusions.  Arthritis.  OSA.  Diabetic retinopathy.  Nonischemic cardiomyopathy.  Chronic Coumadin for atrial fib, DVTs.  Pericardial window 2019.  Transmetatarsal right foot amputation. 08/2015 colonoscopy.  For polyp surveillance.  Had several adenomas in 2006, 2 adenomas in 2010.  Multiple polyps removed from the sigmoid, transverse colon measuring 4 to 9 mm.  At the site of a large polyp removal there was venous oozing which was treated with cautery.  Pathology: TA's without HGD.   Patient has not responded to Dr. Blanch Media colonoscopy callback letter of 08/2018.  At least 3 to 4 weeks of pain in the posterior right thorax.  This does not radiate and he denies abdominal pain.  No associated nausea or vomiting in fact his appetite is good.  He is moving his bowels regularly.  Nephrologist ordered a CT scan confirming cholelithiasis and choledocholithiasis and he was referred to the surgical office. 04/19/2020 CTAP: Cholelithiasis.  Choledocholithiasis, multiple stones up to 7 mm in distal bile duct in dilated CBD measuring 14 mm.  Fat-containing umbilical hernia.  Seen by surgery yesterday and sent to the ED based on ongoing symptoms. T bili 1.3.  Alkaline phosphatase 134.  AST/ALT 28/26.  WBC 5.8.  Platelets 135.  INR 2.3 05/13/2020 CT renal stone study: Choledocholithiasis, multiple stones at  distal CBD.  A dominant stone measures 3.4 cm.  Intra and extrahepatic biliary ductal dilatation.  Minor pericholecystic fat stranding.  Patulous distal esophagus.  Colonic diverticulosis.  Advanced aortic and branch vessel atherosclerosis.  Right femoral head avascular necrosis.    Past Medical History:  Diagnosis Date  . Anemia   . Arthritis    HNP- lumbar, "all over my body"  . Blood transfusion    "years ago; blood was low" (08/05/2013)  . Diabetic nephropathy ([place])   . Diabetic retinopathy   . DVT (deep venous thrombosis) ([place])    "got one in my right leg now; I've had one before too, not sure which leg" (08/05/2013)  . ESRD (end stage renal disease) on dialysis ([place])    TTS, [place]  . Family history of anesthesia complication    " my son wakes up slowly"  . GERD (gastroesophageal reflux disease)    uses alka seltzere on occas.   [name])    "one q now and then" (08/05/2013)  . Hyperlipidemia   . Hypertension   . 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 Non Hodgkin's lymphoma ([place])    Tx 2009; "had chemo; it went away" (08/05/2013)  .  Noncompliance 03/16/2012  . NSVT (nonsustained ventricular tachycardia) ([place]) 03/18/2012  . Peripheral vascular disease ([place])   . Poor historian    pt. unsure of several answers to health history questions   . Skin cancer    melanoma - head  . Sleep apnea    "suppose to have a sleep study, but they never told me when. (08/05/2013)    Past Surgical History:  Procedure Laterality Date  . ACHILLES TENDON SURGERY Right 03/13/2016   Procedure: ACHILLES LENGTHENING/KIDNER;  Surgeon: Edrick Kins, DPM;  Location: [place];  Service: Podiatry;  Laterality: Right;  . AV FISTULA PLACEMENT Left 02/03/2013   Procedure: ARTERIOVENOUS (AV) FISTULA CREATION- LEFT RADIAL CEPHALIC; ULTRASOUND GUIDED;  Surgeon: Mal Misty, MD;  Location: [facility] OR;  Service: Vascular;  Laterality:  Left;  . AV FISTULA PLACEMENT Right 11/08/2015   Procedure: RIGHT BRACHIOCEPHALIC ARTERIOVENOUS (AV) FISTULA CREATION;  Surgeon: Serafina Mitchell, MD;  Location: [place];  Service: Vascular;  Laterality: Right;  . BASCILIC VEIN TRANSPOSITION Right 01/24/2016   Procedure: RIGHT SECOND STAGE BASILIC VEIN TRANSPOSITION;  Surgeon: Angelia Mould, MD;  Location: [place];  Service: Vascular;  Laterality: Right;  . CARDIAC CATHETERIZATION    . COLONOSCOPY N/A 09/23/2015   Procedure: COLONOSCOPY;  Surgeon: Irene Shipper, MD;  Location: [facility];  Service: Endoscopy;  Laterality: N/A;  . Coloscopy    . EYE SURGERY Bilateral   . GAS INSERTION  05/10/2012   Procedure: INSERTION OF GAS;  Surgeon: Hayden Pedro, MD;  Location: [facility];  Service: Ophthalmology;  Laterality: Right;  . LEFT HEART CATH AND CORONARY ANGIOGRAPHY N/A 09/11/2016   Procedure: Left Heart Cath and Coronary Angiography;  Surgeon: Belva Crome, MD;  Location: [facility] [place] CV LAB;  Service: Cardiovascular;  Laterality: N/A;  . LESION EXCISION Right 05/08/2015   Procedure: EXCISION SCALP LESION;  Surgeon: Erroll Luna, MD;  Location: [place];  Service: General;  Laterality: Right;  . MEMBRANE PEEL  05/10/2012   Procedure: MEMBRANE PEEL;  Surgeon: Hayden Pedro, MD;  Location: [facility];  Service: Ophthalmology;  Laterality: Right;  . PARS PLANA VITRECTOMY  08/27/2011   Procedure: PARS PLANA VITRECTOMY WITH 25 GAUGE;  Surgeon: Hayden Pedro, MD;  Location: [facility];  Service: Ophthalmology;  Laterality: Left;  Repair of complex traction retinal detachment left eye  . PARS PLANA VITRECTOMY  05/10/2012   Procedure: PARS PLANA VITRECTOMY WITH 25 GAUGE;  Surgeon: Hayden Pedro, MD;  Location: [place];  Service: Ophthalmology;  Laterality: Right;  Repair Complex Traction Retinal Detachment  . PHOTOCOAGULATION WITH LASER  05/10/2012   Procedure: PHOTOCOAGULATION WITH LASER;  Surgeon: Hayden Pedro, MD;  Location: [place];  Service: Ophthalmology;   Laterality: Right;  . PORT-A-CATH REMOVAL    . PORTACATH PLACEMENT    . SUBXYPHOID PERICARDIAL WINDOW N/A 01/10/2018   Procedure: SUBXYPHOID PERICARDIAL WINDOW;  Surgeon: Rexene Alberts, MD;  Location: [facility];  Service: Thoracic;  Laterality: N/A;  . TRANSMETATARSAL AMPUTATION Right 03/13/2016   Procedure: TRANSMETATARSAL AMPUTATION;  Surgeon: Edrick Kins, DPM;  Location: [facility];  Service: Podiatry;  Laterality: Right;  . VENA CAVA FILTER PLACEMENT  09/2012   due to preparation for surgery    Prior to Admission medications   Medication Sig Start Date End Date Taking? Authorizing Provider  acetaminophen (TYLENOL) 500 MG tablet Take 500-1,000 mg by mouth daily as needed (back pain).   Yes [provider]  B Complex-C-Folic Acid (RENA-VITE  PO) Take 1 tablet by mouth daily. 08/08/13  Yes [provider]  Cholecalciferol (VITAMIN D3) 125 MCG (5000 UT) TABS Take 5,000 Units by mouth daily.   Yes [provider]  cinacalcet (SENSIPAR) 30 MG tablet Take 60 mg by mouth See admin instructions. Take one tablet (30 mg) by mouth twice daily - with lunch and supper 04/26/20  Yes [provider]  insulin aspart (NOVOLOG) 100 UNIT/ML injection Inject 1-9 Units into the skin 3 (three) times daily before meals. Patient taking differently: Inject 10 Units into the skin 3 (three) times daily before meals. 01/21/18  Yes Domenic Polite, MD  insulin glargine (LANTUS) 100 UNIT/ML injection Inject 0.15 mLs (15 Units total) into the skin at bedtime. Patient taking differently: Inject 5-10 Units into the skin See admin instructions. Inject 5 units subcutaneously at bedtime for CBG <145, inject 10 units for CBG 145 or more 03/17/16  Yes Theodis Blaze, MD  lidocaine-prilocaine (EMLA) cream Apply 1 application topically as needed (Apply small amount to access site 1-2 hours before dialysis. Cover with occlusive dressing (saran wrap)).    Yes [provider]  polysaccharide iron  (NIFEREX) 150 MG CAPS capsule Take 1 capsule (150 mg total) by mouth daily. 01/16/11  Yes Isaac Bliss, Rayford Halsted, MD  sevelamer carbonate (RENVELA) 800 MG tablet Take 800 mg by mouth 3 (three) times daily with meals.   Yes [provider]  vitamin C (ASCORBIC ACID) 500 MG tablet Take 500 mg by mouth daily.   Yes [provider]  warfarin (COUMADIN) 5 MG tablet Take 5-7.5 mg by mouth See admin instructions. Take 1/2 tablet (7.5 mg) by mouth on Tuesday after dialysis (approx 11am), take 1 tablet (5 mg) on Thursday and Saturday after dialysis/ take 1 tablet (5 mg) on Sunday, Monday, Wednesday and Friday mornings (non-dialysis days) 04/13/18  Yes [provider]  zinc sulfate 220 (50 Zn) MG capsule Take 220 mg by mouth daily.   Yes [provider]  midodrine (PROAMATINE) 5 MG tablet Take 5 mg by mouth 3 (three) times daily with meals. Take ONLY on DIALYSIS DAYS Patient not taking: No sig reported    [provider]    Scheduled Meds: . cinacalcet  30 mg Oral BID AC  . insulin aspart  0-6 Units Subcutaneous TID WC  . iron polysaccharides  150 mg Oral Daily  . multivitamin  1 tablet Oral QHS  . sevelamer carbonate  800 mg Oral TID WC   Infusions: . dextrose 5 % and 0.45% NaCl 50 mL/hr at 05/14/20 0813   PRN Meds: acetaminophen **OR** acetaminophen, calcium carbonate (dosed in mg elemental calcium), camphor-menthol **AND** hydrOXYzine, docusate sodium, feeding supplement (NEPRO CARB STEADY), fentaNYL (SUBLIMAZE) injection, hydrALAZINE, ondansetron **OR** ondansetron (ZOFRAN) IV, sorbitol, zolpidem   Allergies as of 05/13/2020  . (No Known Allergies)    Family History  Problem Relation Age of Onset  . Anesthesia problems Son   . Hypertension Son   . Diabetes Father   . Hypertension Father   . Other Father        amputation  . Colon cancer Neg Hx     Social History   Socioeconomic History  . Marital status: Single    Spouse name: Not on  file  . Number of children: 1  . Years of education: Not on file  . Highest education level: Not on file  Occupational History  . Occupation: Retired     Comment: truck Geophysicist/field seismologist  Tobacco  Use  . Smoking status: Never Smoker  . Smokeless tobacco: Never Used  Substance and Sexual Activity  . Alcohol use: No    Alcohol/week: 0.0 standard drinks  . Drug use: No  . Sexual activity: Not Currently  Other Topics Concern  . Not on file  Social History Narrative  . Not on file   Social Determinants of Health   Financial Resource Strain: Not on file  Food Insecurity: Not on file  Transportation Needs: Not on file  Physical Activity: Not on file  Stress: Not on file  Social Connections: Not on file  Intimate Partner Violence: Not on file    REVIEW OF SYSTEMS: Constitutional: No profound weakness or fatigue.  Just does not feel good. ENT:  No nose bleeds Pulm: No shortness of breath, no cough. CV:  No palpitations, no LE edema.  No angina GU:  No hematuria, no frequency GI: See HPI. Heme: [place] excessive or unusual bleeding or bruising Transfusions: In 12/2017 received several packets of FFP. Neuro:  No headaches, no seizures or syncope.  Periodic tingling and numbness mostly in his hands especially the right hand Derm:  No itching, no rash or sores.  Endocrine:  No sweats or chills.  No polyuria or dysuria Immunization: Reviewed.  He was vaccinated for COVID-19 in February/March 2021 Travel:  None beyond local counties in last few months.    PHYSICAL EXAM: Vital signs in last 24 hours: Vitals:   05/14/20 0715 05/14/20 0815  BP: (!) 144/77 (!) 169/95  Pulse: 90 77  Resp: 18 18  Temp: 98.3 F (36.8 C)   SpO2: 99% 97%   Wt Readings from Last 3 Encounters:  05/13/20 122.5 kg  11/17/19 120.3 kg  05/19/19 111.1 kg    General: Tall, large but not significantly obese, currently comfortable. Head: No facial asymmetry or swelling.  No signs of head trauma. Eyes: Sclera are  muddy and not particularly icteric.  Conjunctiva pink Ears: Not hard of hearing Nose: No congestion or discharge Mouth: Oral mucosa is moist, pink, clear.  No teeth on the upper gums, at least half the teeth are missing on the lower gums.  Tongue is midline. Neck: No JVD, masses, thyromegaly Lungs: Clear bilaterally without labored breathing or cough Heart: Irregularly irregular, rate controlled.  No MRG.  S1, S2 present Abdomen: Large, moderately obese.  Not tender.  Bowel sounds hypoactive.  No tinkling or tympanic bowel sounds.  No HSM, masses, bruits, hernias Rectal: Deferred Musc/Skeltl: No joint redness, swelling or gross deformity.  Status post right partial foot amputation. Extremities: No CCE.  Thrill palpable in AV fistula in upper right arm Neurologic: Alert.  Oriented x3.  Good historian.  Moves all 4 limbs, no tremors Skin: No rash, no sores Nodes: No cervical adenopathy Psych: Cooperative, calm, pleasant.  Intake/Output from previous day: No intake/output data recorded. Intake/Output this shift: No intake/output data recorded.  LAB RESULTS: Recent Labs    05/13/20 1017 05/14/20 0429  WBC 5.8 5.8  HGB 11.6* 12.5*  HCT 37.1* 39.3  PLT 154 135*   BMET Lab Results  Component Value Date   NA 141 05/14/2020   NA 142 05/13/2020   NA 134 (L) 07/16/2018   K 4.7 05/14/2020   K 4.3 05/13/2020   K 3.8 07/16/2018   CL 99 05/14/2020   CL 96 (L) 05/13/2020   CL 93 (L) 07/16/2018   CO2 24 05/14/2020   CO2 28 05/13/2020   CO2 29 07/16/2018   GLUCOSE  73 05/14/2020   GLUCOSE 130 (H) 05/13/2020   GLUCOSE 135 (H) 07/16/2018   BUN 60 (H) 05/14/2020   BUN 52 (H) 05/13/2020   BUN 17 07/16/2018   CREATININE 11.08 (H) 05/14/2020   CREATININE 9.65 (H) 05/13/2020   CREATININE 4.22 (H) 07/16/2018   CALCIUM 8.3 (L) 05/14/2020   CALCIUM 8.4 (L) 05/13/2020   CALCIUM 8.4 (L) 07/16/2018   LFT Recent Labs    05/13/20 1017 05/14/20 0429  PROT 7.2 7.0  ALBUMIN 3.4* 3.3*   AST 24 28  ALT 27 26  ALKPHOS 121 134*  BILITOT 0.8 1.3*   PT/INR Lab Results  Component Value Date   INR 2.3 (H) 05/14/2020   INR 2.4 (H) 05/13/2020   INR 1.79 01/21/2018   Hepatitis Panel No results for input(s): HEPBSAG, HCVAB, HEPAIGM, HEPBIGM in the last 72 hours. C-Diff No components found for: CDIFF Lipase     Component Value Date/Time   LIPASE 43 05/13/2020 1017    Drugs of Abuse  No results found for: LABOPIA, COCAINSCRNUR, LABBENZ, AMPHETMU, THCU, LABBARB   RADIOLOGY STUDIES: CT Renal Stone Study  Result Date: 05/13/2020 CLINICAL DATA:  Right hydronephrosis on right upper quadrant ultrasound. EXAM: CT ABDOMEN AND PELVIS WITHOUT CONTRAST TECHNIQUE: Multidetector CT imaging of the abdomen and pelvis was performed following the standard protocol without IV contrast. COMPARISON:  Abdominal ultrasound earlier today. Abdominal CT 04/19/2020. FINDINGS: Lower chest: Breathing motion artifact. Left basilar consolidation, slightly greater than typically seen with atelectasis. There is linear atelectasis in the right lower lobe and right middle lobe. Upper normal heart size with coronary artery calcifications. No pleural fluid. Hepatobiliary: Gallbladder distension. There is a dominant intraluminal gallstone measuring 3.4 cm. Additional small layering stones are seen. There is intra and extrahepatic biliary ductal dilatation with at least 3 small stones in the distal common bile duct measuring at least 8 mm. Minimal Peri cholecystic fat stranding. No focal hepatic lesion. Pancreas: No ductal dilatation or inflammation. Spleen: Normal in size without focal abnormality. Adrenals/Urinary Tract: No adrenal nodule. No hydronephrosis, particularly on the right. There are renal vascular calcifications without definite renal calculi. Symmetric perinephric edema is chronic. Ureters are decompressed. Urinary bladder is completely empty. There is mild perivesicular edema. Stomach/Bowel: Patulous  distal esophagus. Decompressed stomach. No small bowel dilatation or obstruction. No evidence of small bowel inflammation. Appendix not confidently visualized on the current exam. No evidence of appendicitis. Small volume of colonic stool. Mild distal colonic diverticulosis without diverticulitis. Vascular/Lymphatic: Advanced aortic and branch atherosclerosis. No aortic aneurysm. Infrarenal IVC filter in place. No abdominopelvic adenopathy. Reproductive: Prostate is unremarkable. Other: No ascites. No free air. Moderate-sized fat containing umbilical with small supraumbilical fat containing hernia. No bowel involvement or inflammation. Musculoskeletal: Stable chronic change at L5-L1 which may be postsurgical or degenerative. Vacuum phenomena at L2-L3. Prominent Schmorl's node inferior endplate of L3, unchanged. Generalized ground-glass density of the osseous structures consistent with renal osteodystrophy. 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 IMPRESSION: 1. Choledocholithiasis with small stones in the distal common bile duct measuring up to 8 mm and subsequent biliary ductal dilatation. Mild gallbladder distension and intraluminal gallstones. Mild pericholecystic inflammation. 2. No right hydronephrosis or stone. Symmetric perinephric edema which appears chronic. 3. Colonic diverticulosis without diverticulitis. 4. 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 5. Advanced atherosclerotic vascular disease. Aortic Atherosclerosis (ICD10-I70.0). Electronically Signed   By: Keith Rake [name]   On: 05/13/2020 17:00   US Abdomen Limited RUQ (LIVER/GB)  Result  Date: 05/13/2020 CLINICAL DATA:  Cholelithiasis. EXAM: ULTRASOUND ABDOMEN LIMITED RIGHT UPPER QUADRANT COMPARISON:  April 19, 2020.  January 10, 2018. FINDINGS: Gallbladder: 2.8 cm gallstone is noted. No significant gallbladder wall thickening or pericholecystic fluid is noted. No sonographic Murphy's sign is  noted. Common bile duct: Diameter: Measures 7 mm proximally and approximately 10 mm distally. Liver: No focal lesion identified. Mildly increased echogenicity of hepatic parenchyma is noted suggesting hepatic steatosis. Portal vein is patent on color Doppler imaging with normal direction of blood flow towards the liver. Other: Possible right nephrolithiasis is noted with mild right hydronephrosis. IMPRESSION: Large solitary gallstone is noted without definite evidence of cholecystitis. Common bile duct is within normal limits proximally, but measures approximately 10 mm distally consistent with mild dilatation. Correlation with liver function tests is recommended to rule out distal common bile duct obstruction. Increased echogenicity of hepatic parenchyma is noted suggesting hepatic steatosis. Possible right nephrolithiasis is noted with mild right hydronephrosis. Electronically Signed   By: Marijo Conception [name]   On: 05/13/2020 15:05     IMPRESSION:   *     Choledocholithiasis.  Symptoms are unusual in that no abdominal pain but persistent posterior right thoracic pain and no nausea, vomiting, anorexia.  *   Cholelithiasis.  *    History of A. fib and DVT.  Chronic Coumadin.  Currently on hold.  INR 2.3 today.  Pharmacy will be managing interim IV heparin.  *     ESRD.  Dialysis on TTS.  *     IDDM.  *    Hx recurrent adenomatous colon polyps in 2006, 2010, 08/2015.  Overdue for surveillance colonoscopy.    PLAN:     *     ERCP, risks, benefits discussed with the patient.  He is agreeable to proceed.  He understands that this may be delayed by a few days due to the Coumadin.  *   Agree with the current diet, renal carb modified.  Unless he has distress associated with meal intake, he can eat solid food until he needs to be n.p.o. for ERCP.  *    Surgery is following the patient   Azucena Freed  05/14/2020, 9:18 AM Phone [phone]   Attending physician's note   I have taken a  history, examined the patient and reviewed the chart. I agree with the Advanced Practitioner's note, impression and recommendations.  69 year old male end-stage renal disease on hemodialysis, non-Hodgkin's lymphoma s/p chemotherapy admitted with abdominal pain  CT abdomen pelvis showed cholelithiasis and multiple stones in distal common bile duct that is distended to 14 mm  He is on chronic anticoagulation with them for atrial fibrillation  INR 2.3  Hold Coumadin  He will need ERCP for CBD stone extraction and will benefit from cholecystectomy likely during this admission  He is currently hemodynamically stable with no leukocytosis or fever.  No signs of cholangitis  We will tentatively plan for ERCP later this week once INR trends down    The patient was provided an opportunity to ask questions and all were answered. The patient agreed with the plan and demonstrated an understanding of the instructions.  Damaris Hippo , MD [phone]

## 2020-05-14 NOTE — Progress Notes (Signed)
New Admission Note:   Arrival Method: wheelchair  Mental Orientation: Alert and oriented  Telemetry: none  Assessment: Completed Skin: Intact IV:  Left AC  Pain: 0/10  Tubes: None Safety Measures: Safety Fall Prevention Plan has been discussed  Admission:  [facility] Orientation: Patient has been orientated to the room, unit and staff.   Family: Son   Orders to be reviewed and implemented. Will continue to monitor the patient. Call light has been placed within reach and bed alarm has been activated.   Dillon Bjork, RN [address] [place]  Phone: [phone]

## 2020-05-14 NOTE — Progress Notes (Signed)
Attempted to receive report from ED RN. She states that she will call me back.

## 2020-05-14 NOTE — Progress Notes (Signed)
ANTICOAGULATION CONSULT NOTE  Pharmacy Consult for Heparin Indication: atrial fibrillation  No Known Allergies  Patient Measurements: Height: 6' 3.5" (191.8 cm) Weight: 122.5 kg (270 lb) IBW/kg (Calculated) : 85.65 Heparin Dosing Weight: 111.7 kg  Vital Signs: Temp: 98.1 F (36.7 C) (12/13 2050) Temp Source: Oral (12/13 2050) BP: 148/88 (12/14 0615) Pulse Rate: 89 (12/14 0615)  Labs: Recent Labs    05/13/20 1017 05/13/20 1739 05/14/20 0429  HGB 11.6*  --  12.5*  HCT 37.1*  --  39.3  PLT 154  --  135*  LABPROT  --  25.2* 24.9*  INR  --  2.4* 2.3*  CREATININE 9.65*  --  11.08*    Estimated Creatinine Clearance: 9.2 mL/min (A) (by C-G formula based on SCr of 11.08 mg/dL (H)).   Medical History: Past Medical History:  Diagnosis Date  . Anemia   . Arthritis    HNP- lumbar, "all over my body"  . Blood transfusion    "years ago; blood was low" (08/05/2013)  . Diabetic nephropathy ([place])   . Diabetic retinopathy   . DVT (deep venous thrombosis) ([place])    "got one in my right leg now; I've had one before too, not sure which leg" (08/05/2013)  . ESRD (end stage renal disease) on dialysis ([place])    TTS, [place]  . Family history of anesthesia complication    " my son wakes up slowly"  . GERD (gastroesophageal reflux disease)    uses alka seltzere on occas.   [name])    "one q now and then" (08/05/2013)  . Hyperlipidemia   . Hypertension   . 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 Non Hodgkin's lymphoma ([place])    Tx 2009; "had chemo; it went away" (08/05/2013)  . Noncompliance 03/16/2012  . NSVT (nonsustained ventricular tachycardia) ([place]) 03/18/2012  . Peripheral vascular disease ([place])   . Poor historian    pt. unsure of several answers to health history questions   . Skin cancer    melanoma - head  . Sleep apnea    "suppose to have a sleep study, but they never told me when.  (08/05/2013)    Medications:  Scheduled:  . insulin aspart  0-6 Units Subcutaneous TID WC  . insulin glargine  10 Units Subcutaneous QHS  . iron polysaccharides  150 mg Oral Daily  . multivitamin  1 tablet Oral QHS  . sevelamer carbonate  800 mg Oral TID WC    Assessment: Patient is a 69 yo male that is being admitted for Cholelithiasis, CBD dilatation. The patient is on warfarin at home for afib. At this time the patient's INR continues to be therapeutic at 2.3. Pharmacy has been asked to dose heparin in this patient for afib while holding warfarin.    Goal of Therapy:  Heparin level 0.3-0.7 units/ml Monitor platelets by anticoagulation protocol: Yes   Plan:  Continue to hold warfarin  Start heparin when INR < 2.0  Monitor INR and CBC daily  Cristela Felt, PharmD Clinical Pharmacist  05/14/2020,7:07 AM

## 2020-05-14 NOTE — Progress Notes (Signed)
[facility] Progress Note     Subjective: CC-  Patient reports mild right sided abdominal/flank pain this morning. Denies n/v. He is hungry. WBC 5.8, Tbili up 1.3 from 0.8. CT from yesterday confirmed choledocholithiasis with small stones in the distal common bile.  Objective: Vital signs in last 24 hours: Temp:  [97.4 F (36.3 C)-98.3 F (36.8 C)] 98.3 F (36.8 C) (12/14 0715) Pulse Rate:  [61-98] 77 (12/14 0815) Resp:  [14-20] 18 (12/14 0815) BP: (121-181)/(67-112) 169/95 (12/14 0815) SpO2:  [94 %-100 %] 97 % (12/14 0815) Weight:  [122.5 kg] 122.5 kg (12/13 1000)    Intake/Output from previous day: No intake/output data recorded. Intake/Output this shift: No intake/output data recorded.  PE: Gen:  Alert, NAD, pleasant HEENT: EOM's intact, pupils equal and round Card:  irregular Pulm:  CTAB, no W/R/R, rate and effort normal Abd: obese, soft, ND, mild RUQ TTP, +BS, no masses, hernias, or organomegaly Ext:  no BUE/BLE edema, calves soft and nontender Psych: A&Ox4  Skin: no rashes noted, warm and dry  Lab Results:  Recent Labs    05/13/20 1017 05/14/20 0429  WBC 5.8 5.8  HGB 11.6* 12.5*  HCT 37.1* 39.3  PLT 154 135*   BMET Recent Labs    05/13/20 1017 05/14/20 0429  NA 142 141  K 4.3 4.7  CL 96* 99  CO2 28 24  GLUCOSE 130* 73  BUN 52* 60*  CREATININE 9.65* 11.08*  CALCIUM 8.4* 8.3*   PT/INR Recent Labs    05/13/20 1739 05/14/20 0429  LABPROT 25.2* 24.9*  INR 2.4* 2.3*   CMP     Component Value Date/Time   NA 141 05/14/2020 0429   NA 142 09/04/2016 1628   K 4.7 05/14/2020 0429   CL 99 05/14/2020 0429   CO2 24 05/14/2020 0429   GLUCOSE 73 05/14/2020 0429   BUN 60 (H) 05/14/2020 0429   BUN 22 09/04/2016 1628   CREATININE 11.08 (H) 05/14/2020 0429   CALCIUM 8.3 (L) 05/14/2020 0429   CALCIUM 9.1 01/09/2013 0903   PROT 7.0 05/14/2020 0429   ALBUMIN 3.3 (L) 05/14/2020 0429   AST 28 05/14/2020 0429   ALT 26 05/14/2020 0429    ALKPHOS 134 (H) 05/14/2020 0429   BILITOT 1.3 (H) 05/14/2020 0429   GFRNONAA 5 (L) 05/14/2020 0429   GFRAA 16 (L) 07/16/2018 2320   Lipase     Component Value Date/Time   LIPASE 43 05/13/2020 1017       Studies/Results: CT Renal Stone Study  Result Date: 05/13/2020 CLINICAL DATA:  Right hydronephrosis on right upper quadrant ultrasound. EXAM: CT ABDOMEN AND PELVIS WITHOUT CONTRAST TECHNIQUE: Multidetector CT imaging of the abdomen and pelvis was performed following the standard protocol without IV contrast. COMPARISON:  Abdominal ultrasound earlier today. Abdominal CT 04/19/2020. FINDINGS: Lower chest: Breathing motion artifact. Left basilar consolidation, slightly greater than typically seen with atelectasis. There is linear atelectasis in the right lower lobe and right middle lobe. Upper normal heart size with coronary artery calcifications. No pleural fluid. Hepatobiliary: Gallbladder distension. There is a dominant intraluminal gallstone measuring 3.4 cm. Additional small layering stones are seen. There is intra and extrahepatic biliary ductal dilatation with at least 3 small stones in the distal common bile duct measuring at least 8 mm. Minimal Peri cholecystic fat stranding. No focal hepatic lesion. Pancreas: No ductal dilatation or inflammation. Spleen: Normal in size without focal abnormality. Adrenals/Urinary Tract: No adrenal nodule. No hydronephrosis, particularly on the right. There are renal  vascular calcifications without definite renal calculi. Symmetric perinephric edema is chronic. Ureters are decompressed. Urinary bladder is completely empty. There is mild perivesicular edema. Stomach/Bowel: Patulous distal esophagus. Decompressed stomach. No small bowel dilatation or obstruction. No evidence of small bowel inflammation. Appendix not confidently visualized on the current exam. No evidence of appendicitis. Small volume of colonic stool. Mild distal colonic diverticulosis without  diverticulitis. Vascular/Lymphatic: Advanced aortic and branch atherosclerosis. No aortic aneurysm. Infrarenal IVC filter in place. No abdominopelvic adenopathy. Reproductive: Prostate is unremarkable. Other: No ascites. No free air. Moderate-sized fat containing umbilical with small supraumbilical fat containing hernia. No bowel involvement or inflammation. Musculoskeletal: Stable chronic change at L5-L1 which may be postsurgical or degenerative. Vacuum phenomena at L2-L3. Prominent Schmorl's node inferior endplate of L3, unchanged. Generalized ground-glass density of the osseous structures consistent with renal osteodystrophy. 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 IMPRESSION: 1. Choledocholithiasis with small stones in the distal common bile duct measuring up to 8 mm and subsequent biliary ductal dilatation. Mild gallbladder distension and intraluminal gallstones. Mild pericholecystic inflammation. 2. No right hydronephrosis or stone. Symmetric perinephric edema which appears chronic. 3. Colonic diverticulosis without diverticulitis. 4. 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 5. Advanced atherosclerotic vascular disease. Aortic Atherosclerosis (ICD10-I70.0). Electronically Signed   By: Keith Rake [name]   On: 05/13/2020 17:00   US Abdomen Limited RUQ (LIVER/GB)  Result Date: 05/13/2020 CLINICAL DATA:  Cholelithiasis. EXAM: ULTRASOUND ABDOMEN LIMITED RIGHT UPPER QUADRANT COMPARISON:  April 19, 2020.  January 10, 2018. FINDINGS: Gallbladder: 2.8 cm gallstone is noted. No significant gallbladder wall thickening or pericholecystic fluid is noted. No sonographic Murphy's sign is noted. Common bile duct: Diameter: Measures 7 mm proximally and approximately 10 mm distally. Liver: No focal lesion identified. Mildly increased echogenicity of hepatic parenchyma is noted suggesting hepatic steatosis. Portal vein is patent on color Doppler imaging with normal  direction of blood flow towards the liver. Other: Possible right nephrolithiasis is noted with mild right hydronephrosis. IMPRESSION: Large solitary gallstone is noted without definite evidence of cholecystitis. Common bile duct is within normal limits proximally, but measures approximately 10 mm distally consistent with mild dilatation. Correlation with liver function tests is recommended to rule out distal common bile duct obstruction. Increased echogenicity of hepatic parenchyma is noted suggesting hepatic steatosis. Possible right nephrolithiasis is noted with mild right hydronephrosis. Electronically Signed   By: Marijo Conception [name]   On: 05/13/2020 15:05    Anti-infectives: Anti-infectives (From admission, onward)   None       Assessment/Plan ESRD on HD T/Th/Sat Hypotension IDDM HLD PVD H/o DVT with filter on Coumadin Hx Non-Hodgkin's lymphoma 2009 s/p chemotherapy Non-ischemic cardiomyopathy with HF (improvd EF 50-55% on ECHO 04/2019) Atrial fibrillation H/o pericardial effusion s/p pericardial window 2019  Right sided abdominal/flank pain Choledocholithiasis - CT from yesterday confirmed choledocholithiasis. I spoke with GI who will see the patient today. ERCP will not be today so I have ordered him a diet. Continue holding coumadin, he is being switched to IV heparin.  - Cardiology has seen and deemed "acceptable risk for surgery if necessary. He will need of course to hold warfarin for up to 5 days with transition to IV heparin per pharmacy.  He denies any anginal symptoms or worsening shortness of breath.  LVEF is near normal based on echo last year.  No indications for further testing at this time.  Blood pressure has been well controlled but is elevated today likely since he did  not take his medications today." - Due to multiple medical problems and patient holding coumadin for ERCP, may be beneficial to consider laparoscopic cholecystectomy this admission once CBD stones are  taken care of.   ID - none FEN -renal/CM diet Foley - none VTE - hold coumadin, IV heparin Follow up - TBD   LOS: 1 day    [place], [facility] 05/14/2020, 8:27 AM Please see Amion for pager number during day hours 7:00am-4:30pm

## 2020-05-14 NOTE — Progress Notes (Signed)
PROGRESS NOTE    [name]  CXK:[id number] DOB: 09-Jun-1950 DOA: 05/13/2020 PCP: Burnard Bunting, MD    Brief Narrative:  [name] is a 69 year old male with past medical history significant for peripheral vascular disease s/p right transmetatarsal amputation, non-Hodgkin's lymphoma, type 2 diabetes mellitus, essential hypertension, hyperlipidemia, chronic systolic congestive heart failure, A. fib/flutter on Coumadin, history of pericardial tamponade s/p window, OSA not on CPAP, ESRD on HD TTS who presented from general surgery office with persistent abdominal pain over the past 2-3 weeks.  Pain is intermittent, not worse after eating.  Patient denies any fevers/chills/night sweats, no nausea/vomiting, normal bowel movements.  In the ED, temperature 98.1, HR 95, RR 16, BP 140/87, SPO2 99% on room air. Sodium 142, potassium 4.3, chloride 96, CO2 28, glucose 130, BUN 52, creatinine 9.65, glucose 130.  Lipase 43.  AST 24, ALT 27, total bilirubin 0.8.  WBC 5.8, hemoglobin 11.6, platelets 154.  Covid-19/influenza A/B PCR negative.  Right upper quadrant ultrasound with large solitary gallstone without evidence of cholecystitis, CBD 10 mm consistent with mild dilation, increased echogenicity hepatic parenchyma, possible right nephrolithiasis. GI, general surgery were consulted.  Hospitalist service consulted for admission and further evaluation and management.  Assessment & Plan:   Principal Problem:   Cholelithiasis without obstruction Active Problems:   Hyperlipidemia   ESRD (end stage renal disease) (HCC)   Systolic heart failure (HCC)   Hypotension   Class 1 obesity due to excess calories with body mass index (BMI) of 33.0 to 33.9 in adult   Choledocholithiasis Patient presenting to the ED from general surgery office with persistent abdominal pain over the past 2 to 3 weeks.  Was found to have CBD ductal dilation with gallstone within the gallbladder on right upper quadrant  ultrasound.  CT renal stone study with choledocholithiasis with small stones distal common bile duct measuring up to 8 mm with biliary ductal dilation, mild gallbladder distention and intraluminal gallstones with pericholecystic inflammation, no right hydronephrosis or stone. --[facility] and general surgery following, appreciate assistance --General surgery considering laparoscopic cholecystectomy once CBD stones removed --GI planning ERCP, awaiting downtrend of INR --Continue renal diet  Chronic systolic congestive heart failure Nonischemic cardiomyopathy Essential hypertension LVEF 30-35% with improvement to 50-55% in 2020.  Follows with cardiology outpatient, Dr. Debara Pickett.  Patient was seen by cardiology on 05/13/2020 for preoperative evaluation.  No indications for further testing at this time, holding Coumadin with heparin drip pending ERCP and likely surgical need as above.  Not on ACE inhibitor/ARB or beta-blocker at home due to history of hypertension.  Hypotension --Continue midodrine 5 mg 3 times daily on dialysis days  Type 2 diabetes mellitus Hemoglobin A1c 7.2, well controlled.  Home regimen includes Lantus 5-10 units subcutaneously nightly. --Hold home Lantus --Insulin sliding scale for further coverage --CBGs before every meal/at bedtime  Paroxysmal atrial fibrillation/flutter on anticoagulation --Continue to hold Coumadin pending ERCP/surgical intervention --INR 2.4>2.3 --Heparin drip, pharmacy consulted for dosing/monitoring --Continue to trend INR daily  ESRD on HD Patient dialyzes TTS schedule at the [facility] HD unit.  Followed by nephrology outpatient, Dr. Marval Regal.  --Nephrology was consulted for continued HD while inpatient  History of non-Hodgkin's lymphoma s/p chemo in remission History of sarcomatoid tumor scalp --Outpatient follow-up with oncology    DVT prophylaxis: Heparin drip Code Status: Full code Family Communication: No family present at  bedside this morning  Disposition Plan:  Status is: Inpatient  Remains inpatient appropriate because:Ongoing active pain requiring inpatient pain management, Ongoing  diagnostic testing needed not appropriate for outpatient work up, Unsafe d/c plan, IV treatments appropriate due to intensity of illness or inability to take PO and Inpatient level of care appropriate due to severity of illness   Dispo: The patient is from: Home              Anticipated d/c is to: Home              Anticipated d/c date is: 3 days              Patient currently is not medically stable to d/c.   Consultants:   [place] [facility]  Cardiology  Nephrology  Procedures:   None  Antimicrobials:   None   Subjective: Patient seen and examined at bedside, resting comfortably.  Pain fairly well controlled.  Awaiting INR to trend down prior to ERCP and possible surgical intervention for choledocholithiasis.  No other questions or concerns at this time.  Patient denies headache, no fever/chills/night sweats, no nausea/vomiting/diarrhea, no chest pain, no palpitations, no shortness of breath, no cough/congestion, no weakness, no fatigue, no paresthesias.  Nursing reports mildly low blood sugar this morning; otherwise no other concerns.  Objective: Vitals:   05/14/20 0715 05/14/20 0815 05/14/20 0930 05/14/20 1100  BP: (!) 144/77 (!) 169/95 (!) 188/92 (!) 176/94  Pulse: 90 77 70 92  Resp: 18 18 18 20   Temp: 98.3 F (36.8 C)     TempSrc:      SpO2: 99% 97% 97% 96%  Weight:      Height:       No intake or output data in the 24 hours ending 05/14/20 1212 Filed Weights   05/13/20 1000  Weight: 122.5 kg    Examination:  General exam: Appears calm and comfortable  Respiratory system: Clear to auscultation. Respiratory effort normal.  On room air Cardiovascular system: S1 & S2 heard, RRR. No JVD, murmurs, rubs, gallops or clicks. No pedal edema.  RUE AV fistula noted Gastrointestinal system:  Abdomen is nondistended, soft, mild RUQ TTP. No organomegaly or masses felt. Normal bowel sounds heard. Central nervous system: Alert and oriented. No focal neurological deficits. Extremities: Symmetric 5 x 5 power. Skin: No rashes, lesions or ulcers Psychiatry: Judgement and insight appear normal. Mood & affect appropriate.     Data Reviewed: I have personally reviewed following labs and imaging studies  CBC: Recent Labs  Lab 05/13/20 1017 05/14/20 0429  WBC 5.8 5.8  HGB 11.6* 12.5*  HCT 37.1* 39.3  MCV 96.9 95.4  PLT 154 578*   Basic Metabolic Panel: Recent Labs  Lab 05/13/20 1017 05/14/20 0429  NA 142 141  K 4.3 4.7  CL 96* 99  CO2 28 24  GLUCOSE 130* 73  BUN 52* 60*  CREATININE 9.65* 11.08*  CALCIUM 8.4* 8.3*   GFR: Estimated Creatinine Clearance: 9.2 mL/min (A) (by C-G formula based on SCr of 11.08 mg/dL (H)). Liver Function Tests: Recent Labs  Lab 05/13/20 1017 05/14/20 0429  AST 24 28  ALT 27 26  ALKPHOS 121 134*  BILITOT 0.8 1.3*  PROT 7.2 7.0  ALBUMIN 3.4* 3.3*   Recent Labs  Lab 05/13/20 1017  LIPASE 43   No results for input(s): AMMONIA in the last 168 hours. Coagulation Profile: Recent Labs  Lab 05/13/20 1739 05/14/20 0429  INR 2.4* 2.3*   Cardiac Enzymes: No results for input(s): CKTOTAL, CKMB, CKMBINDEX, TROPONINI in the last 168 hours. BNP (last 3 results) No results for input(s):  PROBNP in the last 8760 hours. HbA1C: Recent Labs    05/13/20 1741  HGBA1C 7.2*   CBG: Recent Labs  Lab 05/13/20 2252 05/14/20 0748 05/14/20 1031  GLUCAP 80 64* 75   Lipid Profile: No results for input(s): CHOL, HDL, LDLCALC, TRIG, CHOLHDL, LDLDIRECT in the last 72 hours. Thyroid Function Tests: No results for input(s): TSH, T4TOTAL, FREET4, T3FREE, THYROIDAB in the last 72 hours. Anemia Panel: No results for input(s): VITAMINB12, FOLATE, FERRITIN, TIBC, IRON, RETICCTPCT in the last 72 hours. Sepsis Labs: No results for input(s):  PROCALCITON, LATICACIDVEN in the last 168 hours.  Recent Results (from the past 240 hour(s))  Resp Panel by RT-PCR (Flu A&B, Covid) Nasopharyngeal Swab     Status: None   Collection Time: 05/13/20  4:07 PM   Specimen: Nasopharyngeal Swab; Nasopharyngeal(NP) swabs in vial transport medium  Result Value Ref Range Status   SARS Coronavirus 2 by RT PCR NEGATIVE NEGATIVE Final    Comment: (NOTE) SARS-CoV-2 target nucleic acids are NOT DETECTED.  The SARS-CoV-2 RNA is generally detectable in upper respiratory specimens during the acute phase of infection. The lowest concentration of SARS-CoV-2 viral copies this assay can detect is 138 copies/mL. A negative result does not preclude SARS-Cov-2 infection and should not be used as the sole basis for treatment or other patient management decisions. A negative result may occur with  improper specimen collection/handling, submission of specimen other than nasopharyngeal swab, presence of viral mutation(s) within the areas targeted by this assay, and inadequate number of viral copies(<138 copies/mL). A negative result must be combined with clinical observations, patient history, and epidemiological information. The expected result is Negative.  Fact Sheet for Patients:  EntrepreneurPulse.com.au  Fact Sheet for Healthcare Providers:  IncredibleEmployment.be  This test is no t yet approved or cleared by the [place] FDA and  has been authorized for detection and/or diagnosis of SARS-CoV-2 by FDA under an Emergency Use Authorization (EUA). This EUA will remain  in effect (meaning this test can be used) for the duration of the COVID-19 declaration under Section 564(b)(1) of the Act, 21 U.S.C.section 360bbb-3(b)(1), unless the authorization is terminated  or revoked sooner.       Influenza A by PCR NEGATIVE NEGATIVE Final   Influenza B by PCR NEGATIVE NEGATIVE Final    Comment: (NOTE) The Xpert Xpress  SARS-CoV-2/FLU/RSV plus assay is intended as an aid in the diagnosis of influenza from Nasopharyngeal swab specimens and should not be used as a sole basis for treatment. Nasal washings and aspirates are unacceptable for Xpert Xpress SARS-CoV-2/FLU/RSV testing.  Fact Sheet for Patients: EntrepreneurPulse.com.au  Fact Sheet for Healthcare Providers: IncredibleEmployment.be  This test is not yet approved or cleared by the [place] FDA and has been authorized for detection and/or diagnosis of SARS-CoV-2 by FDA under an Emergency Use Authorization (EUA). This EUA will remain in effect (meaning this test can be used) for the duration of the COVID-19 declaration under Section 564(b)(1) of the Act, 21 U.S.C. section 360bbb-3(b)(1), unless the authorization is terminated or revoked.  Performed at [facility], [place] [address], [place], [place] [address]          Radiology Studies: CT Renal Stone Study  Result Date: 05/13/2020 CLINICAL DATA:  Right hydronephrosis on right upper quadrant ultrasound. EXAM: CT ABDOMEN AND PELVIS WITHOUT CONTRAST TECHNIQUE: Multidetector CT imaging of the abdomen and pelvis was performed following the standard protocol without IV contrast. COMPARISON:  Abdominal ultrasound earlier today. Abdominal CT 04/19/2020. FINDINGS: Lower  chest: Breathing motion artifact. Left basilar consolidation, slightly greater than typically seen with atelectasis. There is linear atelectasis in the right lower lobe and right middle lobe. Upper normal heart size with coronary artery calcifications. No pleural fluid. Hepatobiliary: Gallbladder distension. There is a dominant intraluminal gallstone measuring 3.4 cm. Additional small layering stones are seen. There is intra and extrahepatic biliary ductal dilatation with at least 3 small stones in the distal common bile duct measuring at least 8 mm. Minimal Peri cholecystic fat stranding. No  focal hepatic lesion. Pancreas: No ductal dilatation or inflammation. Spleen: Normal in size without focal abnormality. Adrenals/Urinary Tract: No adrenal nodule. No hydronephrosis, particularly on the right. There are renal vascular calcifications without definite renal calculi. Symmetric perinephric edema is chronic. Ureters are decompressed. Urinary bladder is completely empty. There is mild perivesicular edema. Stomach/Bowel: Patulous distal esophagus. Decompressed stomach. No small bowel dilatation or obstruction. No evidence of small bowel inflammation. Appendix not confidently visualized on the current exam. No evidence of appendicitis. Small volume of colonic stool. Mild distal colonic diverticulosis without diverticulitis. Vascular/Lymphatic: Advanced aortic and branch atherosclerosis. No aortic aneurysm. Infrarenal IVC filter in place. No abdominopelvic adenopathy. Reproductive: Prostate is unremarkable. Other: No ascites. No free air. Moderate-sized fat containing umbilical with small supraumbilical fat containing hernia. No bowel involvement or inflammation. Musculoskeletal: Stable chronic change at L5-L1 which may be postsurgical or degenerative. Vacuum phenomena at L2-L3. Prominent Schmorl's node inferior endplate of L3, unchanged. Generalized ground-glass density of the osseous structures consistent with renal osteodystrophy. 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 IMPRESSION: 1. Choledocholithiasis with small stones in the distal common bile duct measuring up to 8 mm and subsequent biliary ductal dilatation. Mild gallbladder distension and intraluminal gallstones. Mild pericholecystic inflammation. 2. No right hydronephrosis or stone. Symmetric perinephric edema which appears chronic. 3. Colonic diverticulosis without diverticulitis. 4. 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 5. Advanced atherosclerotic vascular disease. Aortic Atherosclerosis  (ICD10-I70.0). Electronically Signed   By: Keith Rake [name]   On: 05/13/2020 17:00   US Abdomen Limited RUQ (LIVER/GB)  Result Date: 05/13/2020 CLINICAL DATA:  Cholelithiasis. EXAM: ULTRASOUND ABDOMEN LIMITED RIGHT UPPER QUADRANT COMPARISON:  April 19, 2020.  January 10, 2018. FINDINGS: Gallbladder: 2.8 cm gallstone is noted. No significant gallbladder wall thickening or pericholecystic fluid is noted. No sonographic Murphy's sign is noted. Common bile duct: Diameter: Measures 7 mm proximally and approximately 10 mm distally. Liver: No focal lesion identified. Mildly increased echogenicity of hepatic parenchyma is noted suggesting hepatic steatosis. Portal vein is patent on color Doppler imaging with normal direction of blood flow towards the liver. Other: Possible right nephrolithiasis is noted with mild right hydronephrosis. IMPRESSION: Large solitary gallstone is noted without definite evidence of cholecystitis. Common bile duct is within normal limits proximally, but measures approximately 10 mm distally consistent with mild dilatation. Correlation with liver function tests is recommended to rule out distal common bile duct obstruction. Increased echogenicity of hepatic parenchyma is noted suggesting hepatic steatosis. Possible right nephrolithiasis is noted with mild right hydronephrosis. Electronically Signed   By: Marijo Conception [name]   On: 05/13/2020 15:05        Scheduled Meds: . cinacalcet  30 mg Oral BID AC  . insulin aspart  0-6 Units Subcutaneous TID WC  . iron polysaccharides  150 mg Oral Daily  . multivitamin  1 tablet Oral QHS  . sevelamer carbonate  800 mg Oral TID WC   Continuous Infusions: . dextrose 5 %  and 0.45% NaCl 50 mL/hr at 05/14/20 0813     LOS: 1 day    Time spent: 38 minutes spent on chart review, discussion with nursing staff, consultants, updating family and interview/physical exam; more than 50% of that time was spent in counseling and/or coordination  of care.    Eric J [place] DO [facility] Available via Epic secure chat 7am-7pm After these hours, please refer to coverage provider listed on [email or web address] 05/14/2020, 12:12 PM

## 2020-05-14 NOTE — Plan of Care (Signed)
  Problem: Education: Goal: Knowledge of General Education information will improve Description Including pain rating scale, medication(s)/side effects and non-pharmacologic comfort measures Outcome: Progressing   

## 2020-05-14 NOTE — ED Notes (Signed)
Attempted to give report. Inpt RN unavailable at this time. Provided number for inpt RN to call back.

## 2020-05-14 NOTE — Plan of Care (Signed)
  Problem: Education: Goal: Knowledge of General Education information will improve Description: Including pain rating scale, medication(s)/side effects and non-pharmacologic comfort measures Outcome: Progressing   Problem: Nutrition: Goal: Adequate nutrition will be maintained Outcome: Progressing   

## 2020-05-14 NOTE — ED Notes (Signed)
Report given to inpt RN 

## 2020-05-15 DIAGNOSIS — I151 Hypertension secondary to other renal disorders: Secondary | ICD-10-CM

## 2020-05-15 DIAGNOSIS — N2889 Other specified disorders of kidney and ureter: Secondary | ICD-10-CM

## 2020-05-15 LAB — COMPREHENSIVE METABOLIC PANEL
ALT: 30 U/L (ref 0–44)
AST: 26 U/L (ref 15–41)
Albumin: 3 g/dL — ABNORMAL LOW (ref 3.5–5.0)
Alkaline Phosphatase: 141 U/L — ABNORMAL HIGH (ref 38–126)
Anion gap: 19 — ABNORMAL HIGH (ref 5–15)
BUN: 70 mg/dL — ABNORMAL HIGH (ref 8–23)
CO2: 23 mmol/L (ref 22–32)
Calcium: 7.9 mg/dL — ABNORMAL LOW (ref 8.9–10.3)
Chloride: 98 mmol/L (ref 98–111)
Creatinine, Ser: 12.66 mg/dL — ABNORMAL HIGH (ref 0.61–1.24)
GFR, Estimated: 4 mL/min — ABNORMAL LOW (ref >60)
Glucose, Bld: 133 mg/dL — ABNORMAL HIGH (ref 70–99)
Potassium: 4.6 mmol/L (ref 3.5–5.1)
Sodium: 140 mmol/L (ref 135–145)
Total Bilirubin: 1.2 mg/dL (ref 0.3–1.2)
Total Protein: 6.4 g/dL — ABNORMAL LOW (ref 6.5–8.1)

## 2020-05-15 LAB — GLUCOSE, CAPILLARY
Glucose-Capillary: 121 mg/dL — ABNORMAL HIGH (ref 70–99)
Glucose-Capillary: 107 mg/dL — ABNORMAL HIGH (ref 70–99)
Glucose-Capillary: 140 mg/dL — ABNORMAL HIGH (ref 70–99)
Glucose-Capillary: 114 mg/dL — ABNORMAL HIGH (ref 70–99)

## 2020-05-15 LAB — CBC
HCT: 34.3 % — ABNORMAL LOW (ref 39.0–52.0)
Hemoglobin: 11.2 g/dL — ABNORMAL LOW (ref 13.0–17.0)
MCH: 30.4 pg (ref 26.0–34.0)
MCHC: 32.7 g/dL (ref 30.0–36.0)
MCV: 93 fL (ref 80.0–100.0)
Platelets: 140 10*3/uL — ABNORMAL LOW (ref 150–400)
RBC: 3.69 MIL/uL — ABNORMAL LOW (ref 4.22–5.81)
RDW: 15.9 % — ABNORMAL HIGH (ref 11.5–15.5)
WBC: 4.9 10*3/uL (ref 4.0–10.5)
nRBC: 0 % (ref 0.0–0.2)

## 2020-05-15 LAB — PROTIME-INR
INR: 2.3 — ABNORMAL HIGH (ref 0.8–1.2)
Prothrombin Time: 24.9 seconds — ABNORMAL HIGH (ref 11.4–15.2)

## 2020-05-15 MED ORDER — AMLODIPINE BESYLATE 5 MG PO TABS
5.0000 mg | ORAL_TABLET | Freq: Every day | ORAL | Status: DC
  Administered 2020-05-15 – 2020-05-19 (×3): 5 mg via ORAL
  Filled 2020-05-15 (×3): qty 1

## 2020-05-15 MED ORDER — POLYSACCHARIDE IRON COMPLEX 150 MG PO CAPS
150.0000 mg | ORAL_CAPSULE | Freq: Every day | ORAL | Status: DC
  Administered 2020-05-19 – 2020-05-20 (×2): 150 mg via ORAL
  Filled 2020-05-15 (×2): qty 1

## 2020-05-15 NOTE — Progress Notes (Signed)
   05/15/20 1200  Vitals  Temp 97.9 F (36.6 C)  Temp Source Oral  BP (!) 145/90  MAP (mmHg) 112  BP Location Left Arm  BP Method Automatic  Patient Position (if appropriate) Lying  Pulse Rate 91  Pulse Rate Source Monitor  Resp 17  Oxygen Therapy  SpO2 96 %  O2 Device Room Air  Dialysis Weight  Weight 118.4 kg  Type of Weight Post-Dialysis  Post-Hemodialysis Assessment  Rinseback Volume (mL) 250 mL  KECN 259 V  Dialyzer Clearance Lightly streaked  Duration of HD Treatment -hour(s) 4 hour(s)  Hemodialysis Intake (mL) 500 mL  UF Total -Machine (mL) 2373 mL  Net UF (mL) 1873 mL  Tolerated HD Treatment Yes  Post-Hemodialysis Comments tx complete-pt stable  Fistula / Graft Right Upper arm Arteriovenous fistula  No Placement Date or Time found.   Orientation: Right  Access Location: (c) Upper arm  Access Type: Arteriovenous fistula  Site Condition No complications  Fistula / Graft Assessment Present;Thrill;Bruit  Status Deaccessed  Hd tx complete, pt reports cramping within last 30 minutes of tx, UF stopped, UF=1.8 liters.

## 2020-05-15 NOTE — Progress Notes (Signed)
PROGRESS NOTE    [name]  KCM:[id number] DOB: 1950-09-05 DOA: 05/13/2020 PCP: Burnard Bunting, MD   Brief Narrative: 69 year old with past medical history significant for peripheral vascular disease a status post right transmetatarsal amputation, and non Hodgkin lymphoma, type 2 diabetes mellitus, hypertension, hyperlipidemia, chronic systolic congestive heart failure, A. fib on Coumadin, history of pericardial tamponade status post window, OSA not on CPAP, ESRD on hemodialysis TTS who presented from general surgery office with persistent abdominal pain over the past 2 or 3 weeks.  Patient report pain as intermittent, not worse after eating.  Patient denies any fever chills night sweats, nausea vomiting.  Evaluation in the ED vitals are stable, potassium 4.3, chloride 96, CO2 28, glucose 130, BUN 52, creatinine 9.6, glucose 130, lipase 43.  Bilirubin 0.8.  White blood cell 5.8, hemoglobin 11.6, platelets 154.  COVID-19 influenza negative.  Right upper quadrant ultrasound with large solitary gallstone without evidence of cholecystitis, CBD 10 mm consistent with mild dilation, increase echogenicity hepatic parenchyma, possible right nephrolithiasis.  GI, general surgery were consulted.  Hospitalist service consulted for admission and further evaluation and management.   Assessment & Plan:   Principal Problem:   Cholelithiasis without obstruction Active Problems:   Hyperlipidemia   ESRD (end stage renal disease) (HCC)   Systolic heart failure (HCC)   Hypotension   Class 1 obesity due to excess calories with body mass index (BMI) of 33.0 to 33.9 in adult  1-Choledocholithiasis: Patient presented to the ED from general surgery office with persistent abdominal pain over the last 2 or 3 weeks. Patient was found to have CBD ductal dilation with a gallstone within the gallbladder on  right upper quadrant ultrasound. -CT renal was done.if with choledocholithiasis with a small stone distal  common bile duct measuring up to 8 mm with biliary ductal dilation, mild gallbladder distention and intraluminal gallstone with pericholecystic inflammation -[place], GI and general surgery following. -Plan for ERCP when INR decreased -Currently tolerating diet  2-Chronic Systolic Congestive Heart Failure, nonischemic cardiomyopathy, essential hypertension History of left ventricular ejection fraction 30 to 35% with improvement to 50 to 55% in 2020 Patient was evaluated by cardiology on 05/13/2020 for preoperative evaluation.  No indication for further testing at this time. Currently holding Coumadin, plan to bridge with heparin. Not on ACE or ARB or beta-blocker at home due to history of hypotension.  3-Hypotension;  He was on midodrine, currently on hold due to elevated BP.    4-Paroxysmal A. fib/flutter: On anticoagulation Holding Coumadin pending ERCP. Heparin drip to be managed by pharmacy  5-ESRD on hemodialysis: Continue with dialysis TTS. Appreciate nephrology follow-up  History of non-Hodgkin lymphoma status post chemo in remission, history of sarcomatoid tumor scalp: Follow-up with oncology as an outpatient.  HTN; started on BP medications this admission. Monitor.    Estimated body mass index is 32.2 kg/m as calculated from the following:   Height as of this encounter: 6' 3.5" (1.918 m).   Weight as of this encounter: 118.4 kg.   DVT prophylaxis: scd Code Status: Full Code Family Communication: care discussed with  Disposition Plan:  Status is: Inpatient  Remains inpatient appropriate because:Ongoing active pain requiring inpatient pain management and IV treatments appropriate due to intensity of illness or inability to take PO   Dispo:  Patient From: Home  Planned Disposition: Home  Expected discharge date: 05/17/2020  Medically stable for discharge: No         Consultants:   GI  Nephrology  Procedures:  None  Antimicrobials:     Subjective: Patient denies abdominal pain. He also denies nausea. No bowel movement several days Objective: Vitals:   05/15/20 1156 05/15/20 1200 05/15/20 1212 05/15/20 1519  BP: (!) 151/86 (!) 145/90 (!) 147/83 (!) 143/81  Pulse: 91 91  90  Resp: 17 17  18   Temp:  97.9 F (36.6 C)  98 F (36.7 C)  TempSrc:  Oral  Oral  SpO2: 96% 96%  96%  Weight:  118.4 kg    Height:        Intake/Output Summary (Last 24 hours) at 05/15/2020 1629 Last data filed at 05/15/2020 1515 Gross per 24 hour  Intake 120 ml  Output 1873 ml  Net -1753 ml   Filed Weights   05/15/20 0611 05/15/20 0750 05/15/20 1200  Weight: 120.9 kg 121.3 kg 118.4 kg    Examination:  General exam: Appears calm and comfortable  Respiratory system: Clear to auscultation. Respiratory effort normal. Cardiovascular system: S1 & S2 heard, RRR. No JVD, murmurs, rubs, gallops or clicks. No pedal edema. Gastrointestinal system: Abdomen is nondistended, soft and nontender. No organomegaly or masses felt. Normal bowel sounds heard. Central nervous system: Alert and oriented. No focal neurological deficits. Extremities: Symmetric 5 x 5 power.   Data Reviewed: I have personally reviewed following labs and imaging studies  CBC: Recent Labs  Lab 05/13/20 1017 05/14/20 0429 05/15/20 0151  WBC 5.8 5.8 4.9  HGB 11.6* 12.5* 11.2*  HCT 37.1* 39.3 34.3*  MCV 96.9 95.4 93.0  PLT 154 135* 782*   Basic Metabolic Panel: Recent Labs  Lab 05/13/20 1017 05/14/20 0429 05/15/20 0151  NA 142 141 140  K 4.3 4.7 4.6  CL 96* 99 98  CO2 28 24 23   GLUCOSE 130* 73 133*  BUN 52* 60* 70*  CREATININE 9.65* 11.08* 12.66*  CALCIUM 8.4* 8.3* 7.9*   GFR: Estimated Creatinine Clearance: 7.9 mL/min (A) (by C-G formula based on SCr of 12.66 mg/dL (H)). Liver Function Tests: Recent Labs  Lab 05/13/20 1017 05/14/20 0429 05/15/20 0151  AST 24 28 26   ALT 27 26 30   ALKPHOS 121 134* 141*  BILITOT 0.8 1.3* 1.2  PROT 7.2 7.0 6.4*   ALBUMIN 3.4* 3.3* 3.0*   Recent Labs  Lab 05/13/20 1017  LIPASE 43   No results for input(s): AMMONIA in the last 168 hours. Coagulation Profile: Recent Labs  Lab 05/13/20 1739 05/14/20 0429 05/15/20 0151  INR 2.4* 2.3* 2.3*   Cardiac Enzymes: No results for input(s): CKTOTAL, CKMB, CKMBINDEX, TROPONINI in the last 168 hours. BNP (last 3 results) No results for input(s): PROBNP in the last 8760 hours. HbA1C: Recent Labs    05/13/20 1741  HGBA1C 7.2*   CBG: Recent Labs  Lab 05/14/20 0748 05/14/20 1031 05/14/20 1628 05/14/20 2204 05/15/20 0649  GLUCAP 64* 75 113* 144* 107*   Lipid Profile: No results for input(s): CHOL, HDL, LDLCALC, TRIG, CHOLHDL, LDLDIRECT in the last 72 hours. Thyroid Function Tests: No results for input(s): TSH, T4TOTAL, FREET4, T3FREE, THYROIDAB in the last 72 hours. Anemia Panel: No results for input(s): VITAMINB12, FOLATE, FERRITIN, TIBC, IRON, RETICCTPCT in the last 72 hours. Sepsis Labs: No results for input(s): PROCALCITON, LATICACIDVEN in the last 168 hours.  Recent Results (from the past 240 hour(s))  Resp Panel by RT-PCR (Flu A&B, Covid) Nasopharyngeal Swab     Status: None   Collection Time: 05/13/20  4:07 PM   Specimen: Nasopharyngeal Swab; Nasopharyngeal(NP) swabs in vial transport medium  Result  Value Ref Range Status   SARS Coronavirus 2 by RT PCR NEGATIVE NEGATIVE Final    Comment: (NOTE) SARS-CoV-2 target nucleic acids are NOT DETECTED.  The SARS-CoV-2 RNA is generally detectable in upper respiratory specimens during the acute phase of infection. The lowest concentration of SARS-CoV-2 viral copies this assay can detect is 138 copies/mL. A negative result does not preclude SARS-Cov-2 infection and should not be used as the sole basis for treatment or other patient management decisions. A negative result may occur with  improper specimen collection/handling, submission of specimen other than nasopharyngeal swab, presence  of viral mutation(s) within the areas targeted by this assay, and inadequate number of viral copies(<138 copies/mL). A negative result must be combined with clinical observations, patient history, and epidemiological information. The expected result is Negative.  Fact Sheet for Patients:  EntrepreneurPulse.com.au  Fact Sheet for Healthcare Providers:  IncredibleEmployment.be  This test is no t yet approved or cleared by the [place] FDA and  has been authorized for detection and/or diagnosis of SARS-CoV-2 by FDA under an Emergency Use Authorization (EUA). This EUA will remain  in effect (meaning this test can be used) for the duration of the COVID-19 declaration under Section 564(b)(1) of the Act, 21 U.S.C.section 360bbb-3(b)(1), unless the authorization is terminated  or revoked sooner.       Influenza A by PCR NEGATIVE NEGATIVE Final   Influenza B by PCR NEGATIVE NEGATIVE Final    Comment: (NOTE) The Xpert Xpress SARS-CoV-2/FLU/RSV plus assay is intended as an aid in the diagnosis of influenza from Nasopharyngeal swab specimens and should not be used as a sole basis for treatment. Nasal washings and aspirates are unacceptable for Xpert Xpress SARS-CoV-2/FLU/RSV testing.  Fact Sheet for Patients: EntrepreneurPulse.com.au  Fact Sheet for Healthcare Providers: IncredibleEmployment.be  This test is not yet approved or cleared by the [place] FDA and has been authorized for detection and/or diagnosis of SARS-CoV-2 by FDA under an Emergency Use Authorization (EUA). This EUA will remain in effect (meaning this test can be used) for the duration of the COVID-19 declaration under Section 564(b)(1) of the Act, 21 U.S.C. section 360bbb-3(b)(1), unless the authorization is terminated or revoked.  Performed at [facility], [place] [address] [place], [place] [address]   MRSA PCR Screening      Status: None   Collection Time: 05/14/20  3:18 PM   Specimen: Nasopharyngeal  Result Value Ref Range Status   MRSA by PCR NEGATIVE NEGATIVE Final    Comment:        The GeneXpert MRSA Assay (FDA approved for NASAL specimens only), is one component of a comprehensive MRSA colonization surveillance program. It is not intended to diagnose MRSA infection nor to guide or monitor treatment for MRSA infections. Performed at [facility], [place] [address] [place], [place] [address]          Radiology Studies: CT Renal Stone Study  Result Date: 05/13/2020 CLINICAL DATA:  Right hydronephrosis on right upper quadrant ultrasound. EXAM: CT ABDOMEN AND PELVIS WITHOUT CONTRAST TECHNIQUE: Multidetector CT imaging of the abdomen and pelvis was performed following the standard protocol without IV contrast. COMPARISON:  Abdominal ultrasound earlier today. Abdominal CT 04/19/2020. FINDINGS: Lower chest: Breathing motion artifact. Left basilar consolidation, slightly greater than typically seen with atelectasis. There is linear atelectasis in the right lower lobe and right middle lobe. Upper normal heart size with coronary artery calcifications. No pleural fluid. Hepatobiliary: Gallbladder distension. There is a dominant intraluminal gallstone measuring  3.4 cm. Additional small layering stones are seen. There is intra and extrahepatic biliary ductal dilatation with at least 3 small stones in the distal common bile duct measuring at least 8 mm. Minimal Peri cholecystic fat stranding. No focal hepatic lesion. Pancreas: No ductal dilatation or inflammation. Spleen: Normal in size without focal abnormality. Adrenals/Urinary Tract: No adrenal nodule. No hydronephrosis, particularly on the right. There are renal vascular calcifications without definite renal calculi. Symmetric perinephric edema is chronic. Ureters are decompressed. Urinary bladder is completely empty. There is mild perivesicular edema.  Stomach/Bowel: Patulous distal esophagus. Decompressed stomach. No small bowel dilatation or obstruction. No evidence of small bowel inflammation. Appendix not confidently visualized on the current exam. No evidence of appendicitis. Small volume of colonic stool. Mild distal colonic diverticulosis without diverticulitis. Vascular/Lymphatic: Advanced aortic and branch atherosclerosis. No aortic aneurysm. Infrarenal IVC filter in place. No abdominopelvic adenopathy. Reproductive: Prostate is unremarkable. Other: No ascites. No free air. Moderate-sized fat containing umbilical with small supraumbilical fat containing hernia. No bowel involvement or inflammation. Musculoskeletal: Stable chronic change at L5-L1 which may be postsurgical or degenerative. Vacuum phenomena at L2-L3. Prominent Schmorl's node inferior endplate of L3, unchanged. Generalized ground-glass density of the osseous structures consistent with renal osteodystrophy. 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 IMPRESSION: 1. Choledocholithiasis with small stones in the distal common bile duct measuring up to 8 mm and subsequent biliary ductal dilatation. Mild gallbladder distension and intraluminal gallstones. Mild pericholecystic inflammation. 2. No right hydronephrosis or stone. Symmetric perinephric edema which appears chronic. 3. Colonic diverticulosis without diverticulitis. 4. 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 5. Advanced atherosclerotic vascular disease. Aortic Atherosclerosis (ICD10-I70.0). Electronically Signed   By: Keith Rake [name]   On: 05/13/2020 17:00        Scheduled Meds: . amLODipine  5 mg Oral Daily  . cinacalcet  30 mg Oral BID AC  . [START ON 05/16/2020] doxercalciferol  5 mcg Intravenous Q T,Th,Sa-HD  . insulin aspart  0-6 Units Subcutaneous TID WC  . [START ON 05/18/2020] iron polysaccharides  150 mg Oral Daily  . multivitamin  1 tablet Oral QHS  . sevelamer  carbonate  800 mg Oral TID WC   Continuous Infusions:   LOS: 2 days    Time spent: 35 minutes.     Elmarie Shiley, MD [facility]   If 7PM-7AM, please contact night-coverage [email or web address]  05/15/2020, 4:29 PM

## 2020-05-15 NOTE — Procedures (Signed)
   I was present at this dialysis session, have reviewed the session itself and made  appropriate changes Kelly Splinter MD [place] pager [phone]   05/15/2020, 3:49 PM

## 2020-05-15 NOTE — Consult Note (Signed)
[facility] Renal Consultation Note  Indication for Consultation:  Management of ESRD/hemodialysis; anemia, hypertension/volume and secondary hyperparathyroidism  HPI: [name] is a 69 y.o. male with ESRD secondary DM 2/hypertension, chronic HD [place] TTS, history of PAF/flutter, DVT with filter on Coumadin non-Hodgkin lymphoma 2009 status post chemo also known history remote N ICM, EF 50 to 55% 2020, 2019 tamponade status post pericardial window for hemorrhagic effusion cardiac cath 2019 no significant CAD.  PAD transmit right amputation.  He is compliant at outpatient dialysis unit.  Now admitted with abdominal pain and found to have choledocholithiasis. Pain reported slight somewhat progressive discomfort RUQ for last 2 to 3 weeks.  Denies fever chills nausea vomiting shortness of breath chest pain. Consult for ESRD/HD needs  Currently on hemodialysis tolerating UF abdominal discomfort continued.      Past Medical History:  Diagnosis Date  . Anemia   . Arthritis    HNP- lumbar, "all over my body"  . Blood transfusion    "years ago; blood was low" (08/05/2013)  . Diabetic nephropathy ([place])   . Diabetic retinopathy   . DVT (deep venous thrombosis) ([place])    "got one in my right leg now; I've had one before too, not sure which leg" (08/05/2013)  . ESRD (end stage renal disease) on dialysis ([place])    TTS, [place]  . Family history of anesthesia complication    " my son wakes up slowly"  . GERD (gastroesophageal reflux disease)    uses alka seltzere on occas.   [name])    "one q now and then" (08/05/2013)  . Hyperlipidemia   . Hypertension   . 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 Non Hodgkin's lymphoma ([place])    Tx 2009; "had chemo; it went away" (08/05/2013)  . Noncompliance 03/16/2012  . NSVT (nonsustained ventricular tachycardia) ([place]) 03/18/2012  . Peripheral  vascular disease ([place])   . Poor historian    pt. unsure of several answers to health history questions   . Skin cancer    melanoma - head  . Sleep apnea    "suppose to have a sleep study, but they never told me when. (08/05/2013)    Past Surgical History:  Procedure Laterality Date  . ACHILLES TENDON SURGERY Right 03/13/2016   Procedure: ACHILLES LENGTHENING/KIDNER;  Surgeon: Edrick Kins, DPM;  Location: [place];  Service: Podiatry;  Laterality: Right;  . AV FISTULA PLACEMENT Left 02/03/2013   Procedure: ARTERIOVENOUS (AV) FISTULA CREATION- LEFT RADIAL CEPHALIC; ULTRASOUND GUIDED;  Surgeon: Mal Misty, MD;  [facility];  Service: Vascular;  Laterality: Left;  . AV FISTULA PLACEMENT Right 11/08/2015   Procedure: RIGHT BRACHIOCEPHALIC ARTERIOVENOUS (AV) FISTULA CREATION;  Surgeon: Serafina Mitchell, MD;  Location: [place];  Service: Vascular;  Laterality: Right;  . BASCILIC VEIN TRANSPOSITION Right 01/24/2016   Procedure: RIGHT SECOND STAGE BASILIC VEIN TRANSPOSITION;  Surgeon: Angelia Mould, MD;  Location: [place];  Service: Vascular;  Laterality: Right;  . CARDIAC CATHETERIZATION    . COLONOSCOPY N/A 09/23/2015   Procedure: COLONOSCOPY;  Surgeon: Irene Shipper, MD;  Location: [facility];  Service: Endoscopy;  Laterality: N/A;  . Coloscopy    . EYE SURGERY Bilateral   . GAS INSERTION  05/10/2012   Procedure: INSERTION OF GAS;  Surgeon: Hayden Pedro, MD;  Location: [place];  Service: Ophthalmology;  Laterality: Right;  . LEFT  HEART CATH AND CORONARY ANGIOGRAPHY N/A 09/11/2016   Procedure: Left Heart Cath and Coronary Angiography;  Surgeon: Belva Crome, MD;  Location: [facility];  Service: Cardiovascular;  Laterality: N/A;  . LESION EXCISION Right 05/08/2015   Procedure: EXCISION SCALP LESION;  Surgeon: Erroll Luna, MD;  Location: [place];  Service: General;  Laterality: Right;  . MEMBRANE PEEL  05/10/2012   Procedure: MEMBRANE PEEL;  Surgeon: Hayden Pedro, MD;  Location:  [place];  Service: Ophthalmology;  Laterality: Right;  . PARS PLANA VITRECTOMY  08/27/2011   Procedure: PARS PLANA VITRECTOMY WITH 25 GAUGE;  Surgeon: Hayden Pedro, MD;  Location: [place];  Service: Ophthalmology;  Laterality: Left;  Repair of complex traction retinal detachment left eye  . PARS PLANA VITRECTOMY  05/10/2012   Procedure: PARS PLANA VITRECTOMY WITH 25 GAUGE;  Surgeon: Hayden Pedro, MD;  Location: [place];  Service: Ophthalmology;  Laterality: Right;  Repair Complex Traction Retinal Detachment  . PHOTOCOAGULATION WITH LASER  05/10/2012   Procedure: PHOTOCOAGULATION WITH LASER;  Surgeon: Hayden Pedro, MD;  Location: [place];  Service: Ophthalmology;  Laterality: Right;  . PORT-A-CATH REMOVAL    . PORTACATH PLACEMENT    . SUBXYPHOID PERICARDIAL WINDOW N/A 01/10/2018   Procedure: SUBXYPHOID PERICARDIAL WINDOW;  Surgeon: Rexene Alberts, MD;  Location: [place];  Service: Thoracic;  Laterality: N/A;  . TRANSMETATARSAL AMPUTATION Right 03/13/2016   Procedure: TRANSMETATARSAL AMPUTATION;  Surgeon: Edrick Kins, DPM;  Location: [place];  Service: Podiatry;  Laterality: Right;  . VENA CAVA FILTER PLACEMENT  09/2012   due to preparation for surgery      Family History  Problem Relation Age of Onset  . Anesthesia problems Son   . Hypertension Son   . Diabetes Father   . Hypertension Father   . Other Father        amputation  . Colon cancer Neg Hx       reports that he has never smoked. He has never used smokeless tobacco. He reports that he does not drink alcohol and does not use drugs.  No Known Allergies  Prior to Admission medications   Medication Sig Start Date End Date Taking? Authorizing Provider  acetaminophen (TYLENOL) 500 MG tablet Take 500-1,000 mg by mouth daily as needed (back pain).   Yes [provider]  B Complex-C-Folic Acid (RENA-VITE PO) Take 1 tablet by mouth daily. 08/08/13  Yes [provider]  Cholecalciferol (VITAMIN D3) 125 MCG (5000 UT)  TABS Take 5,000 Units by mouth daily.   Yes [provider]  cinacalcet (SENSIPAR) 30 MG tablet Take 60 mg by mouth See admin instructions. Take one tablet (30 mg) by mouth twice daily - with lunch and supper 04/26/20  Yes [provider]  insulin aspart (NOVOLOG) 100 UNIT/ML injection Inject 1-9 Units into the skin 3 (three) times daily before meals. Patient taking differently: Inject 10 Units into the skin 3 (three) times daily before meals. 01/21/18  Yes Domenic Polite, MD  insulin glargine (LANTUS) 100 UNIT/ML injection Inject 0.15 mLs (15 Units total) into the skin at bedtime. Patient taking differently: Inject 5-10 Units into the skin See admin instructions. Inject 5 units subcutaneously at bedtime for CBG <145, inject 10 units for CBG 145 or more 03/17/16  Yes Theodis Blaze, MD  lidocaine-prilocaine (EMLA) cream Apply 1 application topically as needed (Apply small amount to access site 1-2 hours before dialysis. Cover with occlusive dressing (saran wrap)).  Yes [provider]  polysaccharide iron (NIFEREX) 150 MG CAPS capsule Take 1 capsule (150 mg total) by mouth daily. 01/16/11  Yes Isaac Bliss, Rayford Halsted, MD  sevelamer carbonate (RENVELA) 800 MG tablet Take 800 mg by mouth 3 (three) times daily with meals.   Yes [provider]  vitamin C (ASCORBIC ACID) 500 MG tablet Take 500 mg by mouth daily.   Yes [provider]  warfarin (COUMADIN) 5 MG tablet Take 5-7.5 mg by mouth See admin instructions. Take 1/2 tablet (7.5 mg) by mouth on Tuesday after dialysis (approx 11am), take 1 tablet (5 mg) on Thursday and Saturday after dialysis/ take 1 tablet (5 mg) on Sunday, Monday, Wednesday and Friday mornings (non-dialysis days) 04/13/18  Yes [provider]  zinc sulfate 220 (50 Zn) MG capsule Take 220 mg by mouth daily.   Yes [provider]  midodrine (PROAMATINE) 5 MG tablet Take 5 mg by mouth 3 (three) times daily with meals. Take  ONLY on DIALYSIS DAYS Patient not taking: No sig reported    [provider]     Anti-infectives (From admission, onward)   None      Results for orders placed or performed during the hospital encounter of 05/13/20 (from the past 48 hour(s))  Resp Panel by RT-PCR (Flu A&B, Covid) Nasopharyngeal Swab     Status: None   Collection Time: 05/13/20  4:07 PM   Specimen: Nasopharyngeal Swab; Nasopharyngeal(NP) swabs in vial transport medium  Result Value Ref Range   SARS Coronavirus 2 by RT PCR NEGATIVE NEGATIVE    Comment: (NOTE) SARS-CoV-2 target nucleic acids are NOT DETECTED.  The SARS-CoV-2 RNA is generally detectable in upper respiratory specimens during the acute phase of infection. The lowest concentration of SARS-CoV-2 viral copies this assay can detect is 138 copies/mL. A negative result does not preclude SARS-Cov-2 infection and should not be used as the sole basis for treatment or other patient management decisions. A negative result may occur with  improper specimen collection/handling, submission of specimen other than nasopharyngeal swab, presence of viral mutation(s) within the areas targeted by this assay, and inadequate number of viral copies(<138 copies/mL). A negative result must be combined with clinical observations, patient history, and epidemiological information. The expected result is Negative.  Fact Sheet for Patients:  EntrepreneurPulse.com.au  Fact Sheet for Healthcare Providers:  IncredibleEmployment.be  This test is no t yet approved or cleared by the [place] FDA and  has been authorized for detection and/or diagnosis of SARS-CoV-2 by FDA under an Emergency Use Authorization (EUA). This EUA will remain  in effect (meaning this test can be used) for the duration of the COVID-19 declaration under Section 564(b)(1) of the Act, 21 U.S.C.section 360bbb-3(b)(1), unless the authorization is terminated  or  revoked sooner.       Influenza A by PCR NEGATIVE NEGATIVE   Influenza B by PCR NEGATIVE NEGATIVE    Comment: (NOTE) The Xpert Xpress SARS-CoV-2/FLU/RSV plus assay is intended as an aid in the diagnosis of influenza from Nasopharyngeal swab specimens and should not be used as a sole basis for treatment. Nasal washings and aspirates are unacceptable for Xpert Xpress SARS-CoV-2/FLU/RSV testing.  Fact Sheet for Patients: EntrepreneurPulse.com.au  Fact Sheet for Healthcare Providers: IncredibleEmployment.be  This test is not yet approved or cleared by the [place] FDA and has been authorized for detection and/or diagnosis of SARS-CoV-2 by FDA under an Emergency Use Authorization (EUA). This EUA will remain in effect (meaning this test can  be used) for the duration of the COVID-19 declaration under Section 564(b)(1) of the Act, 21 U.S.C. section 360bbb-3(b)(1), unless the authorization is terminated or revoked.  Performed at [facility], [place] [address], [place], [place] [address]   Protime-INR     Status: Abnormal   Collection Time: 05/13/20  5:39 PM  Result Value Ref Range   Prothrombin Time 25.2 (H) 11.4 - 15.2 seconds   INR 2.4 (H) 0.8 - 1.2    Comment: (NOTE) INR goal varies based on device and disease states. Performed at [facility], [place] [address] [place], [place] [address]   HIV Antibody (routine testing w rflx)     Status: None   Collection Time: 05/13/20  5:39 PM  Result Value Ref Range   HIV Screen 4th Generation wRfx Non Reactive Non Reactive    Comment: Performed at [facility], [place] [address], [place], [place] [address]  Hemoglobin A1c     Status: Abnormal   Collection Time: 05/13/20  5:41 PM  Result Value Ref Range   Hgb A1c MFr Bld 7.2 (H) 4.8 - 5.6 %    Comment: (NOTE) Pre diabetes:          5.7%-6.4%  Diabetes:              >6.4%  Glycemic control for   <7.0% adults with diabetes     Mean Plasma Glucose 159.94 mg/dL    Comment: Performed at [facility] [address] [place], [place] [address]  CBG monitoring, ED     Status: None   Collection Time: 05/13/20 10:52 PM  Result Value Ref Range   Glucose-Capillary 80 70 - 99 mg/dL    Comment: Glucose reference range applies only to samples taken after fasting for at least 8 hours.  Comprehensive metabolic panel     Status: Abnormal   Collection Time: 05/14/20  4:29 AM  Result Value Ref Range   Sodium 141 135 - 145 mmol/L   Potassium 4.7 3.5 - 5.1 mmol/L   Chloride 99 98 - 111 mmol/L   CO2 24 22 - 32 mmol/L   Glucose, Bld 73 70 - 99 mg/dL    Comment: Glucose reference range applies only to samples taken after fasting for at least 8 hours.   BUN 60 (H) 8 - 23 mg/dL   Creatinine, Ser 11.08 (H) 0.61 - 1.24 mg/dL   Calcium 8.3 (L) 8.9 - 10.3 mg/dL   Total Protein 7.0 6.5 - 8.1 g/dL   Albumin 3.3 (L) 3.5 - 5.0 g/dL   AST 28 15 - 41 U/L   ALT 26 0 - 44 U/L   Alkaline Phosphatase 134 (H) 38 - 126 U/L   Total Bilirubin 1.3 (H) 0.3 - 1.2 mg/dL   GFR, Estimated 5 (L) >60 mL/min    Comment: (NOTE) Calculated using the CKD-EPI Creatinine Equation (2021)    Anion gap 18 (H) 5 - 15    Comment: Performed at [facility], [place] [address], [place] [address]  CBC     Status: Abnormal   Collection Time: 05/14/20  4:29 AM  Result Value Ref Range   WBC 5.8 4.0 - 10.5 K/uL   RBC 4.12 (L) 4.22 - 5.81 MIL/uL   Hemoglobin 12.5 (L) 13.0 - 17.0 g/dL   HCT 39.3 39.0 - 52.0 %   MCV 95.4 80.0 - 100.0 fL   MCH 30.3 26.0 - 34.0 pg   MCHC  31.8 30.0 - 36.0 g/dL   RDW 16.3 (H) 11.5 - 15.5 %   Platelets 135 (L) 150 - 400 K/uL   nRBC 0.0 0.0 - 0.2 %    Comment: Performed at [facility] [address] [place], [place] [address]  Protime-INR     Status: Abnormal   Collection Time: 05/14/20  4:29 AM  Result Value Ref Range   Prothrombin Time 24.9 (H) 11.4 - 15.2 seconds   INR 2.3 (H) 0.8 - 1.2    Comment:  (NOTE) INR goal varies based on device and disease states. Performed at [facility], [place] [address], [place], [place] [address]   CBG monitoring, ED     Status: Abnormal   Collection Time: 05/14/20  7:48 AM  Result Value Ref Range   Glucose-Capillary 64 (L) 70 - 99 mg/dL    Comment: Glucose reference range applies only to samples taken after fasting for at least 8 hours.  CBG monitoring, ED     Status: None   Collection Time: 05/14/20 10:31 AM  Result Value Ref Range   Glucose-Capillary 75 70 - 99 mg/dL    Comment: Glucose reference range applies only to samples taken after fasting for at least 8 hours.  MRSA PCR Screening     Status: None   Collection Time: 05/14/20  3:18 PM   Specimen: Nasopharyngeal  Result Value Ref Range   MRSA by PCR NEGATIVE NEGATIVE    Comment:        The GeneXpert MRSA Assay (FDA approved for NASAL specimens only), is one component of a comprehensive MRSA colonization surveillance program. It is not intended to diagnose MRSA infection nor to guide or monitor treatment for MRSA infections. Performed at [facility], [place] [address], [place], [place] [address]   Glucose, capillary     Status: Abnormal   Collection Time: 05/14/20  4:28 PM  Result Value Ref Range   Glucose-Capillary 113 (H) 70 - 99 mg/dL    Comment: Glucose reference range applies only to samples taken after fasting for at least 8 hours.  Glucose, capillary     Status: Abnormal   Collection Time: 05/14/20 10:04 PM  Result Value Ref Range   Glucose-Capillary 144 (H) 70 - 99 mg/dL    Comment: Glucose reference range applies only to samples taken after fasting for at least 8 hours.  Protime-INR     Status: Abnormal   Collection Time: 05/15/20  1:51 AM  Result Value Ref Range   Prothrombin Time 24.9 (H) 11.4 - 15.2 seconds   INR 2.3 (H) 0.8 - 1.2    Comment: (NOTE) INR goal varies based on device and disease states. Performed at [facility], [place] [address],  [place], [place] [address]   CBC     Status: Abnormal   Collection Time: 05/15/20  1:51 AM  Result Value Ref Range   WBC 4.9 4.0 - 10.5 K/uL   RBC 3.69 (L) 4.22 - 5.81 MIL/uL   Hemoglobin 11.2 (L) 13.0 - 17.0 g/dL   HCT 34.3 (L) 39.0 - 52.0 %   MCV 93.0 80.0 - 100.0 fL   MCH 30.4 26.0 - 34.0 pg   MCHC 32.7 30.0 - 36.0 g/dL   RDW 15.9 (H) 11.5 - 15.5 %   Platelets 140 (L) 150 - 400 K/uL   nRBC 0.0 0.0 - 0.2 %    Comment: Performed at [facility], [place],  [place] [address]  Comprehensive metabolic panel     Status: Abnormal   Collection Time: 05/15/20  1:51 AM  Result Value Ref Range   Sodium 140 135 - 145 mmol/L   Potassium 4.6 3.5 - 5.1 mmol/L   Chloride 98 98 - 111 mmol/L   CO2 23 22 - 32 mmol/L   Glucose, Bld 133 (H) 70 - 99 mg/dL    Comment: Glucose reference range applies only to samples taken after fasting for at least 8 hours.   BUN 70 (H) 8 - 23 mg/dL   Creatinine, Ser 12.66 (H) 0.61 - 1.24 mg/dL   Calcium 7.9 (L) 8.9 - 10.3 mg/dL   Total Protein 6.4 (L) 6.5 - 8.1 g/dL   Albumin 3.0 (L) 3.5 - 5.0 g/dL   AST 26 15 - 41 U/L   ALT 30 0 - 44 U/L   Alkaline Phosphatase 141 (H) 38 - 126 U/L   Total Bilirubin 1.2 0.3 - 1.2 mg/dL   GFR, Estimated 4 (L) >60 mL/min    Comment: (NOTE) Calculated using the CKD-EPI Creatinine Equation (2021)    Anion gap 19 (H) 5 - 15    Comment: Performed at [facility], [place] [address] [place], [place] [address]  Glucose, capillary     Status: Abnormal   Collection Time: 05/15/20  6:49 AM  Result Value Ref Range   Glucose-Capillary 107 (H) 70 - 99 mg/dL    Comment: Glucose reference range applies only to samples taken after fasting for at least 8 hours.     ROS:    see HPI  Physical Exam: Vitals:   05/15/20 1000 05/15/20 1030  BP: (!) 154/91 (!) 143/99  Pulse: 97 98  Resp:    Temp:    SpO2:       General: Alert male NAD, appropriate on HEENT: North, EOMI, PERRLA, nonicteric MMM moist Neck: Supple, no  JVD Heart: RRR, no MRG Lungs: CTA, nonlabored Abdomen: Bowel sounds normoactive soft slight discomfort right upper quadrant nondistended Extremities: Trace pedal edema bilateral, right transmet Skin: Dry no overt rash Neuro: Alert, O x3, no acute focal deficits Dialysis Access: Patent on hemodialysis right upper arm AV fistula  Dialysis Orders: Center: [facility] on TTS 4 hours 15 minutes EDW 119.5 kg, 2K, 2.25 calcium bath  RUA AVF No hep Hectorol 9 mics last PTH 252, 245 (decrease to 5 mics this hospitalization  Assessment/Plan 1. ESRD -HD TTS, hemotoday secondary to emergent patient's yesterday and scheduling, K4.6 plan for 3-hour dialysis tomorrow back on schedule 2. Choledocholithiasis-Surgeon seen holding Coumadin for ERCP when INR down, cardiology seen patient marked septal risk for surgery if necessary" 3. Hypertension/volume  As an outpatient midodrine listed, not started in hospital noted slightly elevated BP in hospital, tolerating UF today, amlodipine 5 mg started as recommended by cardiology 4. Anemia  -Hgb 9.2 no ESA needs not on as an outpatient 5. Metabolic bone disease -large dose Hectorol 9 mics decreased to 5 mics on dialysis calcium corrected 8.9 follow-up phosphorus , Renvela as binder also on Sensipar\ 6. DM type II= plan for admit 7. Nutrition -renal carb modified /renal vitamin on Nepro with albumin 3.0  Ernest Haber, PA-C [place] [phone] 05/15/2020, 10:37 AM

## 2020-05-15 NOTE — Progress Notes (Signed)
[place] for Heparin Indication: atrial fibrillation  No Known Allergies  Patient Measurements: Height: 6' 3.5" (191.8 cm) Weight: 120.9 kg (266 lb 8.6 oz) IBW/kg (Calculated) : 85.65 Heparin Dosing Weight: 111.7 kg  Vital Signs: Temp: 97.6 F (36.4 C) (12/15 0611) Temp Source: Oral (12/15 0611) BP: 166/88 (12/15 0611) Pulse Rate: 65 (12/15 0611)  Labs: Recent Labs    05/13/20 1017 05/13/20 1739 05/14/20 0429 05/15/20 0151  HGB 11.6*  --  12.5* 11.2*  HCT 37.1*  --  39.3 34.3*  PLT 154  --  135* 140*  LABPROT  --  25.2* 24.9* 24.9*  INR  --  2.4* 2.3* 2.3*  CREATININE 9.65*  --  11.08* 12.66*    Estimated Creatinine Clearance: 8 mL/min (A) (by C-G formula based on SCr of 12.66 mg/dL (H)).   Medical History: Past Medical History:  Diagnosis Date  . Anemia   . Arthritis    HNP- lumbar, "all over my body"  . Blood transfusion    "years ago; blood was low" (08/05/2013)  . Diabetic nephropathy ([place])   . Diabetic retinopathy   . DVT (deep venous thrombosis) ([place])    "got one in my right leg now; I've had one before too, not sure which leg" (08/05/2013)  . ESRD (end stage renal disease) on dialysis ([place])    TTS, [place]  . Family history of anesthesia complication    " my son wakes up slowly"  . GERD (gastroesophageal reflux disease)    uses alka seltzere on occas.   [name])    "one q now and then" (08/05/2013)  . Hyperlipidemia   . Hypertension   . 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 Non Hodgkin's lymphoma ([place])    Tx 2009; "had chemo; it went away" (08/05/2013)  . Noncompliance 03/16/2012  . NSVT (nonsustained ventricular tachycardia) ([place]) 03/18/2012  . Peripheral vascular disease ([place])   . Poor historian    pt. unsure of several answers to health history questions   . Skin cancer    melanoma - head  . Sleep apnea    "suppose to  have a sleep study, but they never told me when. (08/05/2013)    Medications:  Scheduled:  . cinacalcet  30 mg Oral BID AC  . [START ON 05/16/2020] doxercalciferol  5 mcg Intravenous Q T,Th,Sa-HD  . insulin aspart  0-6 Units Subcutaneous TID WC  . iron polysaccharides  150 mg Oral Daily  . multivitamin  1 tablet Oral QHS  . sevelamer carbonate  800 mg Oral TID WC    Assessment: Patient is a 69 yo male that is being admitted for Cholelithiasis, CBD dilatation. The patient is on warfarin at home for afib. At this time the patient's INR continues to be therapeutic at 2.3. Pharmacy has been asked to dose heparin in this patient for afib while holding warfarin.    Goal of Therapy:  Heparin level 0.3-0.7 units/ml Monitor platelets by anticoagulation protocol: Yes   Plan:  Continue to hold warfarin  Start heparin when INR < 2.0  Monitor INR and CBC daily  Duran Ohern A. Levada Dy, PharmD, BCPS, FNKF Clinical Pharmacist [place] Please utilize Amion for appropriate phone number to reach the unit pharmacist ([place])    05/15/2020,7:26 AM

## 2020-05-15 NOTE — Progress Notes (Signed)
Progress Note     Subjective: Patient reports mild abdominal pain and back pain. Patient reports pain primarily in lower abdomen. Denies nausea or vomiting. Seen in HD this AM.   Objective: Vital signs in last 24 hours: Temp:  [97.4 F (36.3 C)-98.6 F (37 C)] 97.6 F (36.4 C) (12/15 0611) Pulse Rate:  [33-92] 65 (12/15 0611) Resp:  [16-20] 18 (12/15 0611) BP: (148-188)/(67-105) 166/88 (12/15 0611) SpO2:  [90 %-97 %] 96 % (12/15 0611) Weight:  [120.9 kg-122.7 kg] 120.9 kg (12/15 0611) Last BM Date: 05/12/20  Intake/Output from previous day: 12/14 0701 - 12/15 0700 In: 268.9 [I.V.:268.9] Out: 0  Intake/Output this shift: No intake/output data recorded.  PE: General: pleasant, WD, obese malewho is laying in bed in NAD Heart: regular, rate, and rhythm.   Lungs: CTAB, no wheezes, rhonchi, or rales noted.  Respiratory effort nonlabored Abd: soft, very mildly ttp in RUQ, ND, +BS, no masses, hernias, or organomegaly Psych: A&Ox3 with an appropriate affect.    Lab Results:  Recent Labs    05/14/20 0429 05/15/20 0151  WBC 5.8 4.9  HGB 12.5* 11.2*  HCT 39.3 34.3*  PLT 135* 140*   BMET Recent Labs    05/14/20 0429 05/15/20 0151  NA 141 140  K 4.7 4.6  CL 99 98  CO2 24 23  GLUCOSE 73 133*  BUN 60* 70*  CREATININE 11.08* 12.66*  CALCIUM 8.3* 7.9*   PT/INR Recent Labs    05/14/20 0429 05/15/20 0151  LABPROT 24.9* 24.9*  INR 2.3* 2.3*   CMP     Component Value Date/Time   NA 140 05/15/2020 0151   NA 142 09/04/2016 1628   K 4.6 05/15/2020 0151   CL 98 05/15/2020 0151   CO2 23 05/15/2020 0151   GLUCOSE 133 (H) 05/15/2020 0151   BUN 70 (H) 05/15/2020 0151   BUN 22 09/04/2016 1628   CREATININE 12.66 (H) 05/15/2020 0151   CALCIUM 7.9 (L) 05/15/2020 0151   CALCIUM 9.1 01/09/2013 0903   PROT 6.4 (L) 05/15/2020 0151   ALBUMIN 3.0 (L) 05/15/2020 0151   AST 26 05/15/2020 0151   ALT 30 05/15/2020 0151   ALKPHOS 141 (H) 05/15/2020 0151   BILITOT 1.2  05/15/2020 0151   GFRNONAA 4 (L) 05/15/2020 0151   GFRAA 16 (L) 07/16/2018 2320   Lipase     Component Value Date/Time   LIPASE 43 05/13/2020 1017       Studies/Results: CT Renal Stone Study  Result Date: 05/13/2020 CLINICAL DATA:  Right hydronephrosis on right upper quadrant ultrasound. EXAM: CT ABDOMEN AND PELVIS WITHOUT CONTRAST TECHNIQUE: Multidetector CT imaging of the abdomen and pelvis was performed following the standard protocol without IV contrast. COMPARISON:  Abdominal ultrasound earlier today. Abdominal CT 04/19/2020. FINDINGS: Lower chest: Breathing motion artifact. Left basilar consolidation, slightly greater than typically seen with atelectasis. There is linear atelectasis in the right lower lobe and right middle lobe. Upper normal heart size with coronary artery calcifications. No pleural fluid. Hepatobiliary: Gallbladder distension. There is a dominant intraluminal gallstone measuring 3.4 cm. Additional small layering stones are seen. There is intra and extrahepatic biliary ductal dilatation with at least 3 small stones in the distal common bile duct measuring at least 8 mm. Minimal Peri cholecystic fat stranding. No focal hepatic lesion. Pancreas: No ductal dilatation or inflammation. Spleen: Normal in size without focal abnormality. Adrenals/Urinary Tract: No adrenal nodule. No hydronephrosis, particularly on the right. There are renal vascular calcifications without definite renal calculi.  Symmetric perinephric edema is chronic. Ureters are decompressed. Urinary bladder is completely empty. There is mild perivesicular edema. Stomach/Bowel: Patulous distal esophagus. Decompressed stomach. No small bowel dilatation or obstruction. No evidence of small bowel inflammation. Appendix not confidently visualized on the current exam. No evidence of appendicitis. Small volume of colonic stool. Mild distal colonic diverticulosis without diverticulitis. Vascular/Lymphatic: Advanced aortic  and branch atherosclerosis. No aortic aneurysm. Infrarenal IVC filter in place. No abdominopelvic adenopathy. Reproductive: Prostate is unremarkable. Other: No ascites. No free air. Moderate-sized fat containing umbilical with small supraumbilical fat containing hernia. No bowel involvement or inflammation. Musculoskeletal: Stable chronic change at L5-L1 which may be postsurgical or degenerative. Vacuum phenomena at L2-L3. Prominent Schmorl's node inferior endplate of L3, unchanged. Generalized ground-glass density of the osseous structures consistent with renal osteodystrophy. 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 IMPRESSION: 1. Choledocholithiasis with small stones in the distal common bile duct measuring up to 8 mm and subsequent biliary ductal dilatation. Mild gallbladder distension and intraluminal gallstones. Mild pericholecystic inflammation. 2. No right hydronephrosis or stone. Symmetric perinephric edema which appears chronic. 3. Colonic diverticulosis without diverticulitis. 4. 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 5. Advanced atherosclerotic vascular disease. Aortic Atherosclerosis (ICD10-I70.0). Electronically Signed   By: Keith Rake [name]   On: 05/13/2020 17:00   US Abdomen Limited RUQ (LIVER/GB)  Result Date: 05/13/2020 CLINICAL DATA:  Cholelithiasis. EXAM: ULTRASOUND ABDOMEN LIMITED RIGHT UPPER QUADRANT COMPARISON:  April 19, 2020.  January 10, 2018. FINDINGS: Gallbladder: 2.8 cm gallstone is noted. No significant gallbladder wall thickening or pericholecystic fluid is noted. No sonographic Murphy's sign is noted. Common bile duct: Diameter: Measures 7 mm proximally and approximately 10 mm distally. Liver: No focal lesion identified. Mildly increased echogenicity of hepatic parenchyma is noted suggesting hepatic steatosis. Portal vein is patent on color Doppler imaging with normal direction of blood flow towards the liver. Other: Possible  right nephrolithiasis is noted with mild right hydronephrosis. IMPRESSION: Large solitary gallstone is noted without definite evidence of cholecystitis. Common bile duct is within normal limits proximally, but measures approximately 10 mm distally consistent with mild dilatation. Correlation with liver function tests is recommended to rule out distal common bile duct obstruction. Increased echogenicity of hepatic parenchyma is noted suggesting hepatic steatosis. Possible right nephrolithiasis is noted with mild right hydronephrosis. Electronically Signed   By: Marijo Conception [name]   On: 05/13/2020 15:05    Anti-infectives: Anti-infectives (From admission, onward)   None       Assessment/Plan ESRD on HD T/Th/Sat Hypotension IDDM HLD PVD H/o DVT with filter on Coumadin Hx Non-Hodgkin's lymphoma 2009 s/p chemotherapy Non-ischemic cardiomyopathy with HF (improvd EF 50-55% on ECHO 04/2019) Atrial fibrillation H/o pericardial effusion s/p pericardial window 2019   Choledocholithiasis -CT from 12/13 confirmed choledocholithiasis. GI has seen the patient and recommend ERCP once INR down. Continue holding coumadin, he is on heparin gtt. - LFTs and Tbili stable, no leukocytosis  - Cardiology has seen and deemed "acceptable risk for surgery if necessary. He will need of course to hold warfarin for up to 5 days with transition to IV heparin per pharmacy. He denies any anginal symptoms or worsening shortness of breath. LVEF is near normal based on echo last year. No indications for further testing at this time. Blood pressure has been well controlled but is elevated today likely since he did not take his medications today." - Due to multiple medical problems and patient holding coumadin for ERCP, beneficial to consider  laparoscopic cholecystectomy this admission once CBD stones are taken care of.   ID - none FEN -renal/CM diet Foley - none VTE - heparin gtt, INR 2.3 today  Follow up -  TBD  LOS: 2 days    Norm Parcel , [facility] Surgery 05/15/2020, 8:19 AM Please see Amion for pager number during day hours 7:00am-4:30pm

## 2020-05-15 NOTE — Plan of Care (Signed)
  Problem: Education: Goal: Knowledge of General Education information will improve Description Including pain rating scale, medication(s)/side effects and non-pharmacologic comfort measures Outcome: Progressing   

## 2020-05-15 NOTE — Progress Notes (Signed)
DAILY PROGRESS NOTE   Patient Name: [name] Date of Encounter: 05/15/2020 Cardiologist: Pixie Casino, MD  Chief Complaint   Mild abdominal pain  Patient Profile   69 yo male with remote history of NICM, LVEF 30-35% (improved to 50-55% in 2020), PAF/flutter, history of tamponade s/p pericardial window for hemorrhagic effusion, ESRD on HD, DM2, HTN, dyslipidemia and no significant CAD by cath in 2019, presents with abdominal pain and found to have possible biliary source. I was asked to evaluate for preoperative risk.  Subjective   Seen today at dialysis - I spoke with the dialysis nurse, there has been no issue with hypotension at dialysis. He had been off BP meds due to this and at one point required midodrine, but is not receiving that now. Review of BP's during his hospitalization for the past couple of days indicates poorly controlled hypertension.   Objective   Vitals:   05/15/20 0750 05/15/20 0753 05/15/20 0800 05/15/20 0830  BP: (!) 162/85 (!) 166/96 (!) 150/92 (!) 168/96  Pulse: 96 95 94 94  Resp: 18     Temp: 98.3 F (36.8 C)     TempSrc: Oral     SpO2: 92%     Weight: 121.3 kg     Height:        Intake/Output Summary (Last 24 hours) at 05/15/2020 0919 Last data filed at 05/15/2020 0600 Gross per 24 hour  Intake 268.94 ml  Output 0 ml  Net 268.94 ml   Filed Weights   05/14/20 1409 05/15/20 0611 05/15/20 0750  Weight: 122.7 kg 120.9 kg 121.3 kg    Physical Exam   General appearance: alert and no distress Neck: no carotid bruit, no JVD and thyroid not enlarged, symmetric, no tenderness/mass/nodules Lungs: clear to auscultation bilaterally Heart: regular rate and rhythm, S1, S2 normal, no murmur, click, rub or gallop Abdomen: soft, non-tender; bowel sounds normal; no masses,  no organomegaly Extremities: extremities normal, atraumatic, no cyanosis or edema Pulses: 2+ and symmetric Skin: Skin color, texture, turgor normal. No rashes or  lesions Neurologic: Grossly normal Psych: Pleasant  Inpatient Medications    Scheduled Meds: . cinacalcet  30 mg Oral BID AC  . [START ON 05/16/2020] doxercalciferol  5 mcg Intravenous Q T,Th,Sa-HD  . insulin aspart  0-6 Units Subcutaneous TID WC  . iron polysaccharides  150 mg Oral Daily  . multivitamin  1 tablet Oral QHS  . sevelamer carbonate  800 mg Oral TID WC    Continuous Infusions:   PRN Meds: acetaminophen **OR** acetaminophen, calcium carbonate (dosed in mg elemental calcium), camphor-menthol **AND** hydrOXYzine, docusate sodium, feeding supplement (NEPRO CARB STEADY), fentaNYL (SUBLIMAZE) injection, hydrALAZINE, ondansetron **OR** ondansetron (ZOFRAN) IV, sorbitol, zolpidem   Labs   Results for orders placed or performed during the hospital encounter of 05/13/20 (from the past 48 hour(s))  Lipase, blood     Status: None   Collection Time: 05/13/20 10:17 AM  Result Value Ref Range   Lipase 43 11 - 51 U/L    Comment: Performed at [facility], [place] [address], [place], [place] [address]  Comprehensive metabolic panel     Status: Abnormal   Collection Time: 05/13/20 10:17 AM  Result Value Ref Range   Sodium 142 135 - 145 mmol/L   Potassium 4.3 3.5 - 5.1 mmol/L   Chloride 96 (L) 98 - 111 mmol/L   CO2 28 22 - 32 mmol/L   Glucose, Bld 130 (H) 70 - 99 mg/dL  Comment: Glucose reference range applies only to samples taken after fasting for at least 8 hours.   BUN 52 (H) 8 - 23 mg/dL   Creatinine, Ser 9.65 (H) 0.61 - 1.24 mg/dL   Calcium 8.4 (L) 8.9 - 10.3 mg/dL   Total Protein 7.2 6.5 - 8.1 g/dL   Albumin 3.4 (L) 3.5 - 5.0 g/dL   AST 24 15 - 41 U/L   ALT 27 0 - 44 U/L   Alkaline Phosphatase 121 38 - 126 U/L   Total Bilirubin 0.8 0.3 - 1.2 mg/dL   GFR, Estimated 5 (L) >60 mL/min    Comment: (NOTE) Calculated using the CKD-EPI Creatinine Equation (2021)    Anion gap 18 (H) 5 - 15    Comment: Performed at [facility], [place] [address] [place],  [place] [address]  CBC     Status: Abnormal   Collection Time: 05/13/20 10:17 AM  Result Value Ref Range   WBC 5.8 4.0 - 10.5 K/uL   RBC 3.83 (L) 4.22 - 5.81 MIL/uL   Hemoglobin 11.6 (L) 13.0 - 17.0 g/dL   HCT 37.1 (L) 39.0 - 52.0 %   MCV 96.9 80.0 - 100.0 fL   MCH 30.3 26.0 - 34.0 pg   MCHC 31.3 30.0 - 36.0 g/dL   RDW 16.2 (H) 11.5 - 15.5 %   Platelets 154 150 - 400 K/uL   nRBC 0.0 0.0 - 0.2 %    Comment: Performed at [facility], [place] [address] [place], [place] [address]  Resp Panel by RT-PCR (Flu A&B, Covid) Nasopharyngeal Swab     Status: None   Collection Time: 05/13/20  4:07 PM   Specimen: Nasopharyngeal Swab; Nasopharyngeal(NP) swabs in vial transport medium  Result Value Ref Range   SARS Coronavirus 2 by RT PCR NEGATIVE NEGATIVE    Comment: (NOTE) SARS-CoV-2 target nucleic acids are NOT DETECTED.  The SARS-CoV-2 RNA is generally detectable in upper respiratory specimens during the acute phase of infection. The lowest concentration of SARS-CoV-2 viral copies this assay can detect is 138 copies/mL. A negative result does not preclude SARS-Cov-2 infection and should not be used as the sole basis for treatment or other patient management decisions. A negative result may occur with  improper specimen collection/handling, submission of specimen other than nasopharyngeal swab, presence of viral mutation(s) within the areas targeted by this assay, and inadequate number of viral copies(<138 copies/mL). A negative result must be combined with clinical observations, patient history, and epidemiological information. The expected result is Negative.  Fact Sheet for Patients:  EntrepreneurPulse.com.au  Fact Sheet for Healthcare Providers:  IncredibleEmployment.be  This test is no t yet approved or cleared by the [place] FDA and  has been authorized for detection and/or diagnosis of SARS-CoV-2 by FDA under an Emergency Use Authorization  (EUA). This EUA will remain  in effect (meaning this test can be used) for the duration of the COVID-19 declaration under Section 564(b)(1) of the Act, 21 U.S.C.section 360bbb-3(b)(1), unless the authorization is terminated  or revoked sooner.       Influenza A by PCR NEGATIVE NEGATIVE   Influenza B by PCR NEGATIVE NEGATIVE    Comment: (NOTE) The Xpert Xpress SARS-CoV-2/FLU/RSV plus assay is intended as an aid in the diagnosis of influenza from Nasopharyngeal swab specimens and should not be used as a sole basis for treatment. Nasal washings and aspirates are unacceptable for Xpert Xpress SARS-CoV-2/FLU/RSV testing.  Fact Sheet for Patients: EntrepreneurPulse.com.au  Fact Sheet  for Healthcare Providers: IncredibleEmployment.be  This test is not yet approved or cleared by the [place] and has been authorized for detection and/or diagnosis of SARS-CoV-2 by FDA under an Emergency Use Authorization (EUA). This EUA will remain in effect (meaning this test can be used) for the duration of the COVID-19 declaration under Section 564(b)(1) of the Act, 21 U.S.C. section 360bbb-3(b)(1), unless the authorization is terminated or revoked.  Performed at [facility], [place] [address], [place], [place] [address]   Protime-INR     Status: Abnormal   Collection Time: 05/13/20  5:39 PM  Result Value Ref Range   Prothrombin Time 25.2 (H) 11.4 - 15.2 seconds   INR 2.4 (H) 0.8 - 1.2    Comment: (NOTE) INR goal varies based on device and disease states. Performed at [facility], [place] [address], [place], [place] [address]   HIV Antibody (routine testing w rflx)     Status: None   Collection Time: 05/13/20  5:39 PM  Result Value Ref Range   HIV Screen 4th Generation wRfx Non Reactive Non Reactive    Comment: Performed at [facility], [place] [address], [place], [place] [address]  Hemoglobin A1c     Status: Abnormal   Collection  Time: 05/13/20  5:41 PM  Result Value Ref Range   Hgb A1c MFr Bld 7.2 (H) 4.8 - 5.6 %    Comment: (NOTE) Pre diabetes:          5.7%-6.4%  Diabetes:              >6.4%  Glycemic control for   <7.0% adults with diabetes    Mean Plasma Glucose 159.94 mg/dL    Comment: Performed at [facility] [address] [place], [place] [address]  CBG monitoring, ED     Status: None   Collection Time: 05/13/20 10:52 PM  Result Value Ref Range   Glucose-Capillary 80 70 - 99 mg/dL    Comment: Glucose reference range applies only to samples taken after fasting for at least 8 hours.  Comprehensive metabolic panel     Status: Abnormal   Collection Time: 05/14/20  4:29 AM  Result Value Ref Range   Sodium 141 135 - 145 mmol/L   Potassium 4.7 3.5 - 5.1 mmol/L   Chloride 99 98 - 111 mmol/L   CO2 24 22 - 32 mmol/L   Glucose, Bld 73 70 - 99 mg/dL    Comment: Glucose reference range applies only to samples taken after fasting for at least 8 hours.   BUN 60 (H) 8 - 23 mg/dL   Creatinine, Ser 11.08 (H) 0.61 - 1.24 mg/dL   Calcium 8.3 (L) 8.9 - 10.3 mg/dL   Total Protein 7.0 6.5 - 8.1 g/dL   Albumin 3.3 (L) 3.5 - 5.0 g/dL   AST 28 15 - 41 U/L   ALT 26 0 - 44 U/L   Alkaline Phosphatase 134 (H) 38 - 126 U/L   Total Bilirubin 1.3 (H) 0.3 - 1.2 mg/dL   GFR, Estimated 5 (L) >60 mL/min    Comment: (NOTE) Calculated using the CKD-EPI Creatinine Equation (2021)    Anion gap 18 (H) 5 - 15    Comment: Performed at [facility], [place] [address], [place], [place] [address]  CBC     Status: Abnormal   Collection Time: 05/14/20  4:29 AM  Result Value Ref Range   WBC 5.8 4.0 - 10.5  K/uL   RBC 4.12 (L) 4.22 - 5.81 MIL/uL   Hemoglobin 12.5 (L) 13.0 - 17.0 g/dL   HCT 39.3 39.0 - 52.0 %   MCV 95.4 80.0 - 100.0 fL   MCH 30.3 26.0 - 34.0 pg   MCHC 31.8 30.0 - 36.0 g/dL   RDW 16.3 (H) 11.5 - 15.5 %   Platelets 135 (L) 150 - 400 K/uL   nRBC 0.0 0.0 - 0.2 %    Comment: Performed at [facility] [address] [place], [place] [address]  Protime-INR     Status: Abnormal   Collection Time: 05/14/20  4:29 AM  Result Value Ref Range   Prothrombin Time 24.9 (H) 11.4 - 15.2 seconds   INR 2.3 (H) 0.8 - 1.2    Comment: (NOTE) INR goal varies based on device and disease states. Performed at [facility], [place] [address] [place], [place] [address]   CBG monitoring, ED     Status: Abnormal   Collection Time: 05/14/20  7:48 AM  Result Value Ref Range   Glucose-Capillary 64 (L) 70 - 99 mg/dL    Comment: Glucose reference range applies only to samples taken after fasting for at least 8 hours.  CBG monitoring, ED     Status: None   Collection Time: 05/14/20 10:31 AM  Result Value Ref Range   Glucose-Capillary 75 70 - 99 mg/dL    Comment: Glucose reference range applies only to samples taken after fasting for at least 8 hours.  MRSA PCR Screening     Status: None   Collection Time: 05/14/20  3:18 PM   Specimen: Nasopharyngeal  Result Value Ref Range   MRSA by PCR NEGATIVE NEGATIVE    Comment:        The GeneXpert MRSA Assay (FDA approved for NASAL specimens only), is one component of a comprehensive MRSA colonization surveillance program. It is not intended to diagnose MRSA infection nor to guide or monitor treatment for MRSA infections. Performed at [facility], [place] [address] [place], [place] [address]   Glucose, capillary     Status: Abnormal   Collection Time: 05/14/20  4:28 PM  Result Value Ref Range   Glucose-Capillary 113 (H) 70 - 99 mg/dL    Comment: Glucose reference range applies only to samples taken after fasting for at least 8 hours.  Glucose, capillary     Status: Abnormal   Collection Time: 05/14/20 10:04 PM  Result Value Ref Range   Glucose-Capillary 144 (H) 70 - 99 mg/dL    Comment: Glucose reference range applies only to samples taken after fasting for at least 8 hours.  Protime-INR     Status: Abnormal   Collection Time: 05/15/20  1:51 AM   Result Value Ref Range   Prothrombin Time 24.9 (H) 11.4 - 15.2 seconds   INR 2.3 (H) 0.8 - 1.2    Comment: (NOTE) INR goal varies based on device and disease states. Performed at [facility], [place] [address] [place], [place] [address]   CBC     Status: Abnormal   Collection Time: 05/15/20  1:51 AM  Result Value Ref Range   WBC 4.9 4.0 - 10.5 K/uL   RBC 3.69 (L) 4.22 - 5.81 MIL/uL   Hemoglobin 11.2 (L) 13.0 - 17.0 g/dL   HCT 34.3 (L) 39.0 - 52.0 %   MCV 93.0 80.0 - 100.0 fL   MCH 30.4 26.0 - 34.0 pg   MCHC  32.7 30.0 - 36.0 g/dL   RDW 15.9 (H) 11.5 - 15.5 %   Platelets 140 (L) 150 - 400 K/uL   nRBC 0.0 0.0 - 0.2 %    Comment: Performed at [facility] [address], [place], [place] [address]  Comprehensive metabolic panel     Status: Abnormal   Collection Time: 05/15/20  1:51 AM  Result Value Ref Range   Sodium 140 135 - 145 mmol/L   Potassium 4.6 3.5 - 5.1 mmol/L   Chloride 98 98 - 111 mmol/L   CO2 23 22 - 32 mmol/L   Glucose, Bld 133 (H) 70 - 99 mg/dL    Comment: Glucose reference range applies only to samples taken after fasting for at least 8 hours.   BUN 70 (H) 8 - 23 mg/dL   Creatinine, Ser 12.66 (H) 0.61 - 1.24 mg/dL   Calcium 7.9 (L) 8.9 - 10.3 mg/dL   Total Protein 6.4 (L) 6.5 - 8.1 g/dL   Albumin 3.0 (L) 3.5 - 5.0 g/dL   AST 26 15 - 41 U/L   ALT 30 0 - 44 U/L   Alkaline Phosphatase 141 (H) 38 - 126 U/L   Total Bilirubin 1.2 0.3 - 1.2 mg/dL   GFR, Estimated 4 (L) >60 mL/min    Comment: (NOTE) Calculated using the CKD-EPI Creatinine Equation (2021)    Anion gap 19 (H) 5 - 15    Comment: Performed at [facility], [place] [address], [place], [place] [address]  Glucose, capillary     Status: Abnormal   Collection Time: 05/15/20  6:49 AM  Result Value Ref Range   Glucose-Capillary 107 (H) 70 - 99 mg/dL    Comment: Glucose reference range applies only to samples taken after fasting for at least 8 hours.    ECG   N/A  Telemetry    N/A  Radiology    CT Renal Stone Study  Result Date: 05/13/2020 CLINICAL DATA:  Right hydronephrosis on right upper quadrant ultrasound. EXAM: CT ABDOMEN AND PELVIS WITHOUT CONTRAST TECHNIQUE: Multidetector CT imaging of the abdomen and pelvis was performed following the standard protocol without IV contrast. COMPARISON:  Abdominal ultrasound earlier today. Abdominal CT 04/19/2020. FINDINGS: Lower chest: Breathing motion artifact. Left basilar consolidation, slightly greater than typically seen with atelectasis. There is linear atelectasis in the right lower lobe and right middle lobe. Upper normal heart size with coronary artery calcifications. No pleural fluid. Hepatobiliary: Gallbladder distension. There is a dominant intraluminal gallstone measuring 3.4 cm. Additional small layering stones are seen. There is intra and extrahepatic biliary ductal dilatation with at least 3 small stones in the distal common bile duct measuring at least 8 mm. Minimal Peri cholecystic fat stranding. No focal hepatic lesion. Pancreas: No ductal dilatation or inflammation. Spleen: Normal in size without focal abnormality. Adrenals/Urinary Tract: No adrenal nodule. No hydronephrosis, particularly on the right. There are renal vascular calcifications without definite renal calculi. Symmetric perinephric edema is chronic. Ureters are decompressed. Urinary bladder is completely empty. There is mild perivesicular edema. Stomach/Bowel: Patulous distal esophagus. Decompressed stomach. No small bowel dilatation or obstruction. No evidence of small bowel inflammation. Appendix not confidently visualized on the current exam. No evidence of appendicitis. Small volume of colonic stool. Mild distal colonic diverticulosis without diverticulitis. Vascular/Lymphatic: Advanced aortic and branch atherosclerosis. No aortic aneurysm. Infrarenal IVC filter in place. No abdominopelvic adenopathy. Reproductive: Prostate is unremarkable. Other: No  ascites. No free air. Moderate-sized fat containing umbilical with small supraumbilical  fat containing hernia. No bowel involvement or inflammation. Musculoskeletal: Stable chronic change at L5-L1 which may be postsurgical or degenerative. Vacuum phenomena at L2-L3. Prominent Schmorl's node inferior endplate of L3, unchanged. Generalized ground-glass density of the osseous structures consistent with renal osteodystrophy. 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 IMPRESSION: 1. Choledocholithiasis with small stones in the distal common bile duct measuring up to 8 mm and subsequent biliary ductal dilatation. Mild gallbladder distension and intraluminal gallstones. Mild pericholecystic inflammation. 2. No right hydronephrosis or stone. Symmetric perinephric edema which appears chronic. 3. Colonic diverticulosis without diverticulitis. 4. 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 5. Advanced atherosclerotic vascular disease. Aortic Atherosclerosis (ICD10-I70.0). Electronically Signed   By: Keith Rake [name]   On: 05/13/2020 17:00   US Abdomen Limited RUQ (LIVER/GB)  Result Date: 05/13/2020 CLINICAL DATA:  Cholelithiasis. EXAM: ULTRASOUND ABDOMEN LIMITED RIGHT UPPER QUADRANT COMPARISON:  April 19, 2020.  January 10, 2018. FINDINGS: Gallbladder: 2.8 cm gallstone is noted. No significant gallbladder wall thickening or pericholecystic fluid is noted. No sonographic Murphy's sign is noted. Common bile duct: Diameter: Measures 7 mm proximally and approximately 10 mm distally. Liver: No focal lesion identified. Mildly increased echogenicity of hepatic parenchyma is noted suggesting hepatic steatosis. Portal vein is patent on color Doppler imaging with normal direction of blood flow towards the liver. Other: Possible right nephrolithiasis is noted with mild right hydronephrosis. IMPRESSION: Large solitary gallstone is noted without definite evidence of cholecystitis. Common  bile duct is within normal limits proximally, but measures approximately 10 mm distally consistent with mild dilatation. Correlation with liver function tests is recommended to rule out distal common bile duct obstruction. Increased echogenicity of hepatic parenchyma is noted suggesting hepatic steatosis. Possible right nephrolithiasis is noted with mild right hydronephrosis. Electronically Signed   By: Marijo Conception [name]   On: 05/13/2020 15:05    Cardiac Studies   N/A  Assessment   1. Principal Problem: 2.   Cholelithiasis without obstruction 3. Active Problems: 4.   Hyperlipidemia 5.   ESRD (end stage renal disease) ([place]) 6.   Systolic heart failure ([place]) 7.   Hypotension 8.   Class 1 obesity due to excess calories with body mass index (BMI) of 33.0 to 33.9 in adult 9.   Plan   1. Mr. Hun appears to have poorly controlled hypertension - there has been no evidence of dialysis associated hypotension today. He is not requiring midodrine. Will need to restart anti-hypertensive medication and observe. Recommend starting amlodipine 5 mg daily, which can start after dialysis today. It is important to have better BP control prior to possible surgery.  Time Spent Directly with Patient:  I have spent a total of 25 minutes with the patient reviewing hospital notes, telemetry, EKGs, labs and examining the patient as well as establishing an assessment and plan that was discussed personally with the patient.  > 50% of time was spent in direct patient care.  Length of Stay:  LOS: 2 days   Pixie Casino, MD, [facility] [place], [place] Director of the Advanced Lipid Disorders &  Cardiovascular Risk Reduction Clinic Diplomate of the American Board of Clinical Lipidology Attending Cardiologist  Direct Dial: [phone]  Fax: [phone]  Website:  [email or web address] 05/15/2020, 9:19 AM

## 2020-05-16 DIAGNOSIS — K8050 Calculus of bile duct without cholangitis or cholecystitis without obstruction: Secondary | ICD-10-CM

## 2020-05-16 DIAGNOSIS — I150 Renovascular hypertension: Secondary | ICD-10-CM

## 2020-05-16 DIAGNOSIS — K8010 Calculus of gallbladder with chronic cholecystitis without obstruction: Secondary | ICD-10-CM

## 2020-05-16 LAB — CBC
HCT: 35.6 % — ABNORMAL LOW (ref 39.0–52.0)
HCT: 34.8 % — ABNORMAL LOW (ref 39.0–52.0)
Hemoglobin: 11.8 g/dL — ABNORMAL LOW (ref 13.0–17.0)
Hemoglobin: 11.2 g/dL — ABNORMAL LOW (ref 13.0–17.0)
MCH: 30.7 pg (ref 26.0–34.0)
MCH: 30.4 pg (ref 26.0–34.0)
MCHC: 33.1 g/dL (ref 30.0–36.0)
MCHC: 32.2 g/dL (ref 30.0–36.0)
MCV: 92.7 fL (ref 80.0–100.0)
MCV: 94.6 fL (ref 80.0–100.0)
Platelets: 128 10*3/uL — ABNORMAL LOW (ref 150–400)
Platelets: 139 10*3/uL — ABNORMAL LOW (ref 150–400)
RBC: 3.84 MIL/uL — ABNORMAL LOW (ref 4.22–5.81)
RBC: 3.68 MIL/uL — ABNORMAL LOW (ref 4.22–5.81)
RDW: 15.9 % — ABNORMAL HIGH (ref 11.5–15.5)
RDW: 15.8 % — ABNORMAL HIGH (ref 11.5–15.5)
WBC: 4.3 10*3/uL (ref 4.0–10.5)
WBC: 4.6 10*3/uL (ref 4.0–10.5)
nRBC: 0 % (ref 0.0–0.2)
nRBC: 0 % (ref 0.0–0.2)

## 2020-05-16 LAB — RENAL FUNCTION PANEL
Albumin: 3.1 g/dL — ABNORMAL LOW (ref 3.5–5.0)
Anion gap: 17 — ABNORMAL HIGH (ref 5–15)
BUN: 37 mg/dL — ABNORMAL HIGH (ref 8–23)
CO2: 25 mmol/L (ref 22–32)
Calcium: 8.1 mg/dL — ABNORMAL LOW (ref 8.9–10.3)
Chloride: 94 mmol/L — ABNORMAL LOW (ref 98–111)
Creatinine, Ser: 9.14 mg/dL — ABNORMAL HIGH (ref 0.61–1.24)
GFR, Estimated: 6 mL/min — ABNORMAL LOW (ref >60)
Glucose, Bld: 111 mg/dL — ABNORMAL HIGH (ref 70–99)
Phosphorus: 4.8 mg/dL — ABNORMAL HIGH (ref 2.5–4.6)
Potassium: 4.1 mmol/L (ref 3.5–5.1)
Sodium: 136 mmol/L (ref 135–145)

## 2020-05-16 LAB — HEPARIN LEVEL (UNFRACTIONATED): Heparin Unfractionated: 0.41 IU/mL (ref 0.30–0.70)

## 2020-05-16 LAB — GLUCOSE, CAPILLARY
Glucose-Capillary: 80 mg/dL (ref 70–99)
Glucose-Capillary: 131 mg/dL — ABNORMAL HIGH (ref 70–99)
Glucose-Capillary: 108 mg/dL — ABNORMAL HIGH (ref 70–99)

## 2020-05-16 LAB — PROTIME-INR
INR: 1.9 — ABNORMAL HIGH (ref 0.8–1.2)
Prothrombin Time: 21.1 seconds — ABNORMAL HIGH (ref 11.4–15.2)

## 2020-05-16 MED ORDER — HEPARIN (PORCINE) 25000 UT/250ML-% IV SOLN
1650.0000 [IU]/h | INTRAVENOUS | Status: AC
  Administered 2020-05-16: 1650 [IU]/h via INTRAVENOUS
  Filled 2020-05-16: qty 250

## 2020-05-16 MED ORDER — DOXERCALCIFEROL 4 MCG/2ML IV SOLN
INTRAVENOUS | Status: AC
  Filled 2020-05-16: qty 2

## 2020-05-16 MED ORDER — DOXERCALCIFEROL 4 MCG/2ML IV SOLN
INTRAVENOUS | Status: AC
  Filled 2020-05-16: qty 4

## 2020-05-16 MED ORDER — HEPARIN (PORCINE) 25000 UT/250ML-% IV SOLN
1650.0000 [IU]/h | INTRAVENOUS | Status: DC
  Administered 2020-05-16: 1650 [IU]/h via INTRAVENOUS
  Filled 2020-05-16: qty 250

## 2020-05-16 NOTE — Progress Notes (Signed)
Subjective:  Seen on  hd tolerating 2l uf, Today is normal day TTS . abd discomfort stable   Objective Vital signs in last 24 hours: Vitals:   05/15/20 1519 05/15/20 2105 05/16/20 0550 05/16/20 1002  BP: (!) 143/81 117/67 126/79 100/63  Pulse: 90 66 64 (!) 56  Resp: 18 20 18 17   Temp: 98 F (36.7 C) 99 F (37.2 C) 97.8 F (36.6 C) 97.9 F (36.6 C)  TempSrc: Oral Oral Oral Oral  SpO2: 96% 93% 97% 98%  Weight:  118.5 kg    Height:       Weight change: -1.4 kg  General: Alert NAD on HD  Heart: RRR, no MRG Lungs: CTA, nonlabored Abdomen: Bowel sounds normoactive soft slight discomfort right upper quadrant nondistended Extremities: no  pedal edema bilateral, right transmet Dialysis Access: Patent on hemodialysis right upper arm AV fistula   OP Dialysis Orders: : [facility] on TTS 4 hours 15 minutes EDW 119.5 kg, 2K, 2.25 calcium bath  RUA AVF No hep Hectorol 9 mics last PTH 252, 245 (decrease to 5 mics this hospitalization  Problem/Plan: 1. ESRD -HD TTS, hemo today on schedule k4.1 2. Choledocholithiasis-Surgeon seen holding Coumadin for ERCP when INR down, cardiology seen patient marked  risk for surgery if necessary" Noted ERCP Tomor Planned  3. Hypertension/volume  Bp better wiith Amlodipine 5 mg q day .  As an outpatient midodrine listed, not started in hospital, tolerating UF today 4. Anemia  -Hgb 9.2 >11.2 this am no ESA needs// not on as an outpatient 5. Metabolic bone disease -OP large dose Hectorol 9 mics decreased to 5 mics on dialysis calcium corrected 8.9 , phosphorus 4.8 , Renvela as binder also on Sensipar\ 6. DM type II= plan for admit 7. Nutrition -renal carb modified /renal vitamin on Nepro with albumin 3.1  Ernest Haber, PA-C [facility] [phone] 05/16/2020,10:25 AM  LOS: 3 days   Labs: Basic Metabolic Panel: Recent Labs  Lab 05/13/20 1017 05/14/20 0429 05/15/20 0151  NA 142 141 140  K 4.3 4.7 4.6  CL 96* 99 98  CO2 28  24 23   GLUCOSE 130* 73 133*  BUN 52* 60* 70*  CREATININE 9.65* 11.08* 12.66*  CALCIUM 8.4* 8.3* 7.9*   Liver Function Tests: Recent Labs  Lab 05/13/20 1017 05/14/20 0429 05/15/20 0151  AST 24 28 26   ALT 27 26 30   ALKPHOS 121 134* 141*  BILITOT 0.8 1.3* 1.2  PROT 7.2 7.0 6.4*  ALBUMIN 3.4* 3.3* 3.0*   Recent Labs  Lab 05/13/20 1017  LIPASE 43   No results for input(s): AMMONIA in the last 168 hours. CBC: Recent Labs  Lab 05/13/20 1017 05/14/20 0429 05/15/20 0151 05/16/20 0323  WBC 5.8 5.8 4.9 4.3  HGB 11.6* 12.5* 11.2* 11.8*  HCT 37.1* 39.3 34.3* 35.6*  MCV 96.9 95.4 93.0 92.7  PLT 154 135* 140* 128*   Cardiac Enzymes: No results for input(s): CKTOTAL, CKMB, CKMBINDEX, TROPONINI in the last 168 hours. CBG: Recent Labs  Lab 05/14/20 2204 05/15/20 0649 05/15/20 1634 05/15/20 2047 05/16/20 0641  GLUCAP 144* 107* 140* 114* 80    Studies/Results: No results found. Medications: . heparin 1,650 Units/hr (05/16/20 0948)   . amLODipine  5 mg Oral Daily  . cinacalcet  30 mg Oral BID AC  . doxercalciferol  5 mcg Intravenous Q T,Th,Sa-HD  . insulin aspart  0-6 Units Subcutaneous TID WC  . [START ON 05/18/2020] iron polysaccharides  150 mg Oral Daily  .  multivitamin  1 tablet Oral QHS  . sevelamer carbonate  800 mg Oral TID WC

## 2020-05-16 NOTE — H&P (View-Only) (Signed)
Progress Note   Subjective  Chief Complaint: Choledocholithiasis  Today, the patient is found during hemodialysis.  He tells me he continues to have some right upper quadrant pain but really has no other problems, no further nausea or vomiting.  He is ready for his procedure tomorrow.   Objective   Vital signs in last 24 hours: Temp:  [97.8 F (36.6 C)-99 F (37.2 C)] 98 F (36.7 C) (12/16 1030) Pulse Rate:  [56-92] 92 (12/16 1200) Resp:  [16-20] 16 (12/16 1030) BP: (100-148)/(63-92) 148/92 (12/16 1200) SpO2:  [93 %-99 %] 99 % (12/16 1030) Weight:  [118.5 kg-118.7 kg] 118.7 kg (12/16 1030) Last BM Date: 05/13/20 General:    AA male in NAD Heart:  Regular rate and rhythm; no murmurs Lungs: Respirations even and unlabored, lungs CTA bilaterally Abdomen:  Soft, mild RUQ ttp and nondistended. Normal bowel sounds. Extremities:  Without edema. Psych:  Cooperative. Normal mood and affect.  Intake/Output from previous day: 12/15 0701 - 12/16 0700 In: 120 [P.O.:120] Out: 1873  Intake/Output this shift: Total I/O In: 120 [P.O.:120] Out: -   Lab Results: Recent Labs    05/15/20 0151 05/16/20 0323 05/16/20 1048  WBC 4.9 4.3 4.6  HGB 11.2* 11.8* 11.2*  HCT 34.3* 35.6* 34.8*  PLT 140* 128* 139*   BMET Recent Labs    05/14/20 0429 05/15/20 0151 05/16/20 1048  NA 141 140 136  K 4.7 4.6 4.1  CL 99 98 94*  CO2 24 23 25   GLUCOSE 73 133* 111*  BUN 60* 70* 37*  CREATININE 11.08* 12.66* 9.14*  CALCIUM 8.3* 7.9* 8.1*   LFT Recent Labs    05/15/20 0151 05/16/20 1048  PROT 6.4*  --   ALBUMIN 3.0* 3.1*  AST 26  --   ALT 30  --   ALKPHOS 141*  --   BILITOT 1.2  --    PT/INR Recent Labs    05/15/20 0151 05/16/20 0323  LABPROT 24.9* 21.1*  INR 2.3* 1.9*     Assessment / Plan:   Assessment: 1.  Choledocholithiasis: Symptoms unusual and that he really has not a lot of abdominal pain, though does describe some right upper quadrant pain now 2.   Cholelithiasis 3.  History of A. fib and DVT: Chronic Coumadin currently on hold, INR slowly trending down, 1.9 today, currently on interim IV heparin 4.  ESRD on dialysis Tuesday Thursday and Saturday  Plan: 1.  Patient is scheduled for an ERCP tomorrow hopefully. 2.  We will repeat INR in the morning, may need some FFP prior to procedure. 3.  We will need to hold heparin 6 hours prior to time procedure. 4.  Ordered for IV heparin to be stopped at 8:15 AM tomorrow, this is 6 hours prior to scheduled ERCP. 5.  Please await any further recommendations from Dr. Silverio Decamp later today  Thank you for kind consultation, we will continue to follow.   LOS: 3 days   Levin Erp  05/16/2020, 12:42 PM   Attending physician's note   I have taken an interval history, reviewed the chart and examined the patient. I agree with the Advanced Practitioner's note, impression and recommendations.    Plan for ERCP tomorrow. INR is trending down below 2. Hold Heparin gtt  6 hours prior to procedure tomorrow afternoon NPO after midnight  The risks and benefits as well as alternatives of endoscopic procedure(s) have been discussed and reviewed. All questions answered. The patient agrees to proceed.  The patient was provided an opportunity to ask questions and all were answered. The patient agreed with the plan and demonstrated an understanding of the instructions.  Damaris Hippo , MD [phone]

## 2020-05-16 NOTE — Progress Notes (Addendum)
Progress Note   Subjective  Chief Complaint: Choledocholithiasis  Today, the patient is found during hemodialysis.  He tells me he continues to have some right upper quadrant pain but really has no other problems, no further nausea or vomiting.  He is ready for his procedure tomorrow.   Objective   Vital signs in last 24 hours: Temp:  [97.8 F (36.6 C)-99 F (37.2 C)] 98 F (36.7 C) (12/16 1030) Pulse Rate:  [56-92] 92 (12/16 1200) Resp:  [16-20] 16 (12/16 1030) BP: (100-148)/(63-92) 148/92 (12/16 1200) SpO2:  [93 %-99 %] 99 % (12/16 1030) Weight:  [118.5 kg-118.7 kg] 118.7 kg (12/16 1030) Last BM Date: 05/13/20 General:    AA male in NAD Heart:  Regular rate and rhythm; no murmurs Lungs: Respirations even and unlabored, lungs CTA bilaterally Abdomen:  Soft, mild RUQ ttp and nondistended. Normal bowel sounds. Extremities:  Without edema. Psych:  Cooperative. Normal mood and affect.  Intake/Output from previous day: 12/15 0701 - 12/16 0700 In: 120 [P.O.:120] Out: 1873  Intake/Output this shift: Total I/O In: 120 [P.O.:120] Out: -   Lab Results: Recent Labs    05/15/20 0151 05/16/20 0323 05/16/20 1048  WBC 4.9 4.3 4.6  HGB 11.2* 11.8* 11.2*  HCT 34.3* 35.6* 34.8*  PLT 140* 128* 139*   BMET Recent Labs    05/14/20 0429 05/15/20 0151 05/16/20 1048  NA 141 140 136  K 4.7 4.6 4.1  CL 99 98 94*  CO2 24 23 25   GLUCOSE 73 133* 111*  BUN 60* 70* 37*  CREATININE 11.08* 12.66* 9.14*  CALCIUM 8.3* 7.9* 8.1*   LFT Recent Labs    05/15/20 0151 05/16/20 1048  PROT 6.4*  --   ALBUMIN 3.0* 3.1*  AST 26  --   ALT 30  --   ALKPHOS 141*  --   BILITOT 1.2  --    PT/INR Recent Labs    05/15/20 0151 05/16/20 0323  LABPROT 24.9* 21.1*  INR 2.3* 1.9*     Assessment / Plan:   Assessment: 1.  Choledocholithiasis: Symptoms unusual and that he really has not a lot of abdominal pain, though does describe some right upper quadrant pain now 2.   Cholelithiasis 3.  History of A. fib and DVT: Chronic Coumadin currently on hold, INR slowly trending down, 1.9 today, currently on interim IV heparin 4.  ESRD on dialysis Tuesday Thursday and Saturday  Plan: 1.  Patient is scheduled for an ERCP tomorrow hopefully. 2.  We will repeat INR in the morning, may need some FFP prior to procedure. 3.  We will need to hold heparin 6 hours prior to time procedure. 4.  Ordered for IV heparin to be stopped at 8:15 AM tomorrow, this is 6 hours prior to scheduled ERCP. 5.  Please await any further recommendations from Dr. Silverio Decamp later today  Thank you for kind consultation, we will continue to follow.   LOS: 3 days   Levin Erp  05/16/2020, 12:42 PM   Attending physician's note   I have taken an interval history, reviewed the chart and examined the patient. I agree with the Advanced Practitioner's note, impression and recommendations.    Plan for ERCP tomorrow. INR is trending down below 2. Hold Heparin gtt  6 hours prior to procedure tomorrow afternoon NPO after midnight  The risks and benefits as well as alternatives of endoscopic procedure(s) have been discussed and reviewed. All questions answered. The patient agrees to proceed.  The patient was provided an opportunity to ask questions and all were answered. The patient agreed with the plan and demonstrated an understanding of the instructions.  Damaris Hippo , MD [phone]

## 2020-05-16 NOTE — Evaluation (Signed)
Physical Therapy Evaluation Patient Details Name: [name] MRN: [id number] DOB: [birth year] Today's Date: 05/16/2020   History of Present Illness  69 yo male presenting with abdominal pain; choledocholithiasis, scheduled for GB evaluation on 1/10. PMH including OSA not on CPAP; PVD s/p R TMA; NHL; DM; HTN; HLD; chronic systolic CHF; afib/flutter; h/o pericardial tamponade s/p window; and ESRD on TTS HD.  Clinical Impression   Patient received in recliner, pleasant and willing to participate in PT today. Session limited as transport tech arrived to take him to HD, however able to practice transfers and walk to the door then back to bed. Grossly weak and needs increased time/has increased effort to perform functional tasks, but safe and steady with RW. Left in bed with transport staff  attending, all needs otherwise met. Will benefit from skilled HHPT f/u at DC.     Follow Up Recommendations Home health PT;Supervision for mobility/OOB    Equipment Recommendations  Rolling walker with 5" wheels;3in1 (PT)    Recommendations for Other Services       Precautions / Restrictions Precautions Precautions: Fall Restrictions Weight Bearing Restrictions: No      Mobility  Bed Mobility               General bed mobility comments: up in recliner upon entry    Transfers Overall transfer level: Needs assistance Equipment used: Rolling walker (2 wheeled) Transfers: Sit to/from Stand Sit to Stand: Supervision         General transfer comment: S for safety, increased effort and considerable weakness noted in BLEs but able to get to full upright without assist  Ambulation/Gait Ambulation/Gait assistance: Min guard Gait Distance (Feet): 20 Feet Assistive device: Rolling walker (2 wheeled) Gait Pattern/deviations: Step-through pattern;Decreased step length - right;Decreased step length - left;Trunk flexed;Decreased stride length Gait velocity: decreased   General Gait Details:  slow but steady with RW, Min guard provided for safety but no physical assist given; distance limited as transport staff had just arrived to take him to HD  Stairs            Wheelchair Mobility    Modified Rankin (Stroke Patients Only)       Balance Overall balance assessment: Needs assistance Sitting-balance support: No upper extremity supported;Feet supported Sitting balance-Leahy Scale: Good     Standing balance support: No upper extremity supported;During functional activity Standing balance-Leahy Scale: Fair Standing balance comment: reliant on BUE support                             Pertinent Vitals/Pain Pain Assessment: No/denies pain    Home Living Family/patient expects to be discharged to:: Private residence Living Arrangements: Alone Available Help at Discharge: Family;Available PRN/intermittently;Personal care attendant (brother will stay periodically) Type of Home: Apartment Home Access: Ramped entrance     Home Layout: One level Home Equipment: Walker - 2 wheels;Cane - single point;Shower seat      Prior Function Level of Independence: Needs assistance   Gait / Transfers Assistance Needed: Uses a cane around the house and RW for mobility outside home  ADL's / Homemaking Assistance Needed: Performs BADLs. PCA performs cooking, cleaning, and driving to grocery store.  Comments: PCA 5xwk for 4 hours     Hand Dominance   Dominant Hand: Right    Extremity/Trunk Assessment   Upper Extremity Assessment Upper Extremity Assessment: Defer to OT evaluation    Lower Extremity Assessment Lower Extremity Assessment: Generalized weakness  Cervical / Trunk Assessment Cervical / Trunk Assessment: Normal  Communication   Communication: No difficulties  Cognition Arousal/Alertness: Awake/alert Behavior During Therapy: WFL for tasks assessed/performed Overall Cognitive Status: Within Functional Limits for tasks assessed                                  General Comments: Requiring increased time at times but feel this is baseline. Very agreeable to therapy      General Comments      Exercises     Assessment/Plan    PT Assessment Patient needs continued PT services  PT Problem List Decreased strength;Decreased knowledge of use of DME;Decreased activity tolerance;Decreased safety awareness;Decreased balance;Decreased mobility;Pain       PT Treatment Interventions DME instruction;Balance training;Gait training;Stair training;Functional mobility training;Patient/family education;Therapeutic activities;Therapeutic exercise    PT Goals (Current goals can be found in the Care Plan section)  Acute Rehab PT Goals Patient Stated Goal: Go home PT Goal Formulation: With patient Time For Goal Achievement: 05/30/20 Potential to Achieve Goals: Good    Frequency Min 3X/week   Barriers to discharge        Co-evaluation               AM-PAC PT "6 Clicks" Mobility  Outcome Measure Help needed turning from your back to your side while in a flat bed without using bedrails?: A Little Help needed moving from lying on your back to sitting on the side of a flat bed without using bedrails?: A Little Help needed moving to and from a bed to a chair (including a wheelchair)?: A Little Help needed standing up from a chair using your arms (e.g., wheelchair or bedside chair)?: A Little Help needed to walk in hospital room?: A Little Help needed climbing 3-5 steps with a railing? : A Lot 6 Click Score: 17    End of Session   Activity Tolerance: Patient tolerated treatment well Patient left: in bed;with call bell/phone within reach;Other (comment) (transport tech present and attending) Nurse Communication: Mobility status PT Visit Diagnosis: Unsteadiness on feet (R26.81);Difficulty in walking, not elsewhere classified (R26.2);Muscle weakness (generalized) (M62.81)    Time: 3668-1594 PT Time Calculation (min)  (ACUTE ONLY): 8 min   Charges:   PT Evaluation $PT Eval Low Complexity: 1 Low          Windell Norfolk, DPT, PN1   Supplemental Physical Therapist [place]    Pager [phone] Acute Rehab Office [phone]

## 2020-05-16 NOTE — TOC Initial Note (Signed)
Transition of Care [facility]) - Initial/Assessment Note    Patient Details  Name: [name] MRN: [id number] Date of Birth: 1950/08/03  [facility]) CM/SW Contact:    Bartholomew Crews, RN Phone Number: [phone] 05/16/2020, 5:04 PM  Clinical Narrative:                  Spoke with patient at the bedside. He is home alone. Has aide services through [facility] 5 days a week. Uses [employer] in [place] for transportation. Attends hemodialysis at [facility] on [facility]. Stated that his brother checks in on him. His son will provide transportation home. Discussed recommendations for home health PT. Patient is agreeable. Discussed agency choice. Patient cannot remember name of agency, but will have his aide check on this. NCM will f/u with patient. TOC following for transition needs.   Expected Discharge Plan: [place] Barriers to Discharge: Continued Medical Work up   Patient Goals and CMS Choice Patient states their goals for this hospitalization and ongoing recovery are:: return home CMS Medicare.gov Compare Post Acute Care list provided to:: Patient Choice offered to / list presented to : Patient  Expected Discharge Plan and Services Expected Discharge Plan: [place] In-house Referral: [facility] Discharge Planning Services: CM Consult Post Acute Care Choice: [place] arrangements for the past 2 months: Apartment                           HH Arranged: PT          Prior Living Arrangements/Services Living arrangements for the past 2 months: Apartment Lives with:: Self Patient language and need for interpreter reviewed:: Yes Do you feel safe going back to the place where you live?: Yes          Current home services: DME,Homehealth aide Criminal Activity/Legal Involvement Pertinent to Current Situation/Hospitalization: No - Comment as needed  Activities of Daily Living Home Assistive Devices/Equipment: None ADL  Screening (condition at time of admission) Patient's cognitive ability adequate to safely complete daily activities?: Yes Is the patient deaf or have difficulty hearing?: No Does the patient have difficulty seeing, even when wearing glasses/contacts?: No Does the patient have difficulty concentrating, remembering, or making decisions?: No Patient able to express need for assistance with ADLs?: Yes Does the patient have difficulty dressing or bathing?: No Independently performs ADLs?: Yes (appropriate for developmental age) Does the patient have difficulty walking or climbing stairs?: No Weakness of Legs: None Weakness of Arms/Hands: None  Permission Sought/Granted                  Emotional Assessment Appearance:: Appears stated age Attitude/Demeanor/Rapport: Engaged Affect (typically observed): Accepting Orientation: : Oriented to Self,Oriented to  Time,Oriented to Place,Oriented to Situation   Psych Involvement: No (comment)  Admission diagnosis:  Cholelithiasis without obstruction [K80.20] Gallstones [K80.20] Patient Active Problem List   Diagnosis Date Noted  . Choledocholithiasis   . Cholelithiasis without obstruction 05/13/2020  . Class 1 obesity due to excess calories with body mass index (BMI) of 33.0 to 33.9 in adult 05/13/2020  . Malnutrition of moderate degree 01/17/2018  . Toxic metabolic encephalopathy 37/85/8850  . Elevated LFTs   . Idiopathic acute pancreatitis without infection or necrosis   . Acute respiratory failure with hypoxia ([place])   . Septic shock ([place])   . Cholecystitis   . Metabolic acidosis   . Pericardial effusion with cardiac tamponade   .  Hypotension 01/09/2018  . Sepsis ([place]) 01/09/2018  . Skin cancer of scalp or skin of neck 11/11/2016  . Abnormal nuclear stress test 09/11/2016  . Systolic heart failure ([place])   . History of colonic polyps   . Benign neoplasm of transverse colon   . ESRD (end stage renal disease) ([place]) 01/31/2013  .  Chronic anticoagulation 12/27/2012  . Vitreous hemorrhage ([place]) 04/19/2012  . NSVT (nonsustained ventricular tachycardia) ([place]) 03/18/2012  . Noncompliance 03/16/2012  . Chest pain, no MI, negative myoview, most likely muscular sketal pain 03/15/2012  . Proliferative diabetic retinopathy associated with type 2 diabetes mellitus ([place]) 08/18/2011  . 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 Non Hodgkin's lymphoma ([place])   . Hyperlipidemia   . Diabetic nephropathy ([place])   . DVT (deep venous thrombosis) ([place])   . Healthcare-associated pneumonia 01/15/2011  . Anemia in chronic kidney disease 01/12/2011  . Warfarin-induced coagulopathy ([place]) 01/12/2011  . History of DVT of lower extremity 01/12/2011  . ABDOMINAL PAIN -GENERALIZED 10/24/2008  . PERSONAL HX COLONIC POLYPS 10/24/2008   PCP:  Burnard Bunting, MD Pharmacy:   [facility] [address] [place] - [address] [place] [address] Phone: [phone] Fax: [phone]  [place], [place] - [place] [address] Phone: [phone] Fax: [phone]  [facility] [place], [place] - [address] [place], [address] [address] Phone: [phone] Fax: [phone]     Social Determinants of Health (SDOH) Interventions    Readmission Risk Interventions No flowsheet data found.

## 2020-05-16 NOTE — Evaluation (Addendum)
Occupational Therapy Evaluation Patient Details Name: [name] MRN: [id number] DOB: May 21, 1951 Today's Date: 05/16/2020    History of Present Illness 69 yo male presenting with abdominal pain; choledocholithiasis, scheduled for GB evaluation on 1/10. PMH including OSA not on CPAP; PVD s/p R TMA; NHL; DM; HTN; HLD; chronic systolic CHF; afib/flutter; h/o pericardial tamponade s/p window; and ESRD on TTS HD.   Clinical Impression   PTA, pt was living alone and was independent with BADLs and light IADLs; has a PCA for IADLs and uses transportation for to/from HD.  Pt currently performing ADLs and functional mobility at Supervision level with RW. Pt present with decreased activity tolerance and requiring seated rest break. Pt would benefit from further acute OT to facilitate safe dc. Recommend dc to home once medically stable per physician. All acute OT needs met and will sign off.     Follow Up Recommendations  No OT follow up;Supervision - Intermittent    Equipment Recommendations  None recommended by OT    Recommendations for Other Services PT consult     Precautions / Restrictions Precautions Precautions: Fall      Mobility Bed Mobility               General bed mobility comments: Sitting at EOB upon arrival    Transfers Overall transfer level: Needs assistance Equipment used: Rolling walker (2 wheeled) Transfers: Sit to/from Stand Sit to Stand: Supervision         General transfer comment: Supervision for safety    Balance Overall balance assessment: Needs assistance Sitting-balance support: No upper extremity supported;Feet supported Sitting balance-Leahy Scale: Good     Standing balance support: No upper extremity supported;During functional activity Standing balance-Leahy Scale: Fair                             ADL either performed or assessed with clinical judgement   ADL Overall ADL's : Needs assistance/impaired Eating/Feeding:  Set up;Sitting   Grooming: Oral care;Wash/dry face;Supervision/safety;Set up;Standing   Upper Body Bathing: Set up;Supervision/ safety;Sitting   Lower Body Bathing: Set up;Supervison/ safety;Sit to/from stand Lower Body Bathing Details (indicate cue type and reason): Applying lotion to LBs with use of figure four method while seated Upper Body Dressing : Set up;Supervision/safety;Sitting Upper Body Dressing Details (indicate cue type and reason): donning new gown Lower Body Dressing: Supervision/safety;Set up;Sit to/from stand   Toilet Transfer: Supervision/safety;Ambulation (simulated to recliner)           Functional mobility during ADLs: Supervision/safety;Rolling walker General ADL Comments: Pt resenting at supervision level. Pt with decreased standing and activity tolerance. Requiring seated rest breaks     Vision Baseline Vision/History: Wears glasses Wears Glasses: Reading only Patient Visual Report: No change from baseline       Perception     Praxis      Pertinent Vitals/Pain Pain Assessment: No/denies pain     Hand Dominance Right   Extremity/Trunk Assessment Upper Extremity Assessment Upper Extremity Assessment: Overall WFL for tasks assessed   Lower Extremity Assessment Lower Extremity Assessment: Defer to PT evaluation   Cervical / Trunk Assessment Cervical / Trunk Assessment: Normal   Communication Communication Communication: No difficulties   Cognition Arousal/Alertness: Awake/alert Behavior During Therapy: WFL for tasks assessed/performed Overall Cognitive Status: Within Functional Limits for tasks assessed  General Comments: Requiring increased time at times but feel this is baseline. Very agreeable to therapy   General Comments       Exercises     Shoulder Instructions      Home Living Family/patient expects to be discharged to:: Private residence Living Arrangements: Alone Available  Help at Discharge: Family;Available PRN/intermittently;Personal care attendant (brother will stay periodically) Type of Home: Apartment Home Access: Ramped entrance     Home Layout: One level     Bathroom Shower/Tub: Teacher, early years/pre: Handicapped height     Home Equipment: Environmental consultant - 2 wheels;Cane - single point;Shower seat          Prior Functioning/Environment Level of Independence: Needs assistance  Gait / Transfers Assistance Needed: Uses a cane around the house and RW for mobility outside home ADL's / Homemaking Assistance Needed: Performs BADLs. PCA performs cooking, cleaning, and driving to grocery store.   Comments: PCA 5xwk for 4 hours        OT Problem List: Decreased strength;Decreased range of motion;Decreased activity tolerance;Impaired vision/perception;Impaired balance (sitting and/or standing);Decreased knowledge of use of DME or AE;Decreased knowledge of precautions;Cardiopulmonary status limiting activity      OT Treatment/Interventions:      OT Goals(Current goals can be found in the care plan section) Acute Rehab OT Goals Patient Stated Goal: Go home OT Goal Formulation: All assessment and education complete, DC therapy  OT Frequency:     Barriers to D/C:            Co-evaluation              AM-PAC OT "6 Clicks" Daily Activity     Outcome Measure Help from another person eating meals?: None Help from another person taking care of personal grooming?: A Little Help from another person toileting, which includes using toliet, bedpan, or urinal?: A Little Help from another person bathing (including washing, rinsing, drying)?: A Little Help from another person to put on and taking off regular upper body clothing?: None Help from another person to put on and taking off regular lower body clothing?: A Little 6 Click Score: 20   End of Session Equipment Utilized During Treatment: Rolling walker Nurse Communication: Mobility  status  Activity Tolerance: Patient tolerated treatment well Patient left: in chair;with call bell/phone within reach  OT Visit Diagnosis: Unsteadiness on feet (R26.81);Other abnormalities of gait and mobility (R26.89);Muscle weakness (generalized) (M62.81)                Time: 2111-7356 OT Time Calculation (min): 15 min Charges:  OT General Charges $OT Visit: 1 Visit OT Evaluation $OT Eval Low Complexity: [place], OTR/L Acute Rehab Pager: [phone] Office: [place] 05/16/2020, 9:24 AM

## 2020-05-16 NOTE — Progress Notes (Signed)
DAILY PROGRESS NOTE   Patient Name: [name] Date of Encounter: 05/16/2020 Cardiologist: Pixie Casino, MD  Chief Complaint   Mild abdominal pain  Patient Profile   69 yo male with remote history of NICM, LVEF 30-35% (improved to 50-55% in 2020), PAF/flutter, history of tamponade s/p pericardial window for hemorrhagic effusion, ESRD on HD, DM2, HTN, dyslipidemia and no significant CAD by cath in 2019, presents with abdominal pain and found to have possible biliary source. I was asked to evaluate for preoperative risk.  Subjective   Tolerated dialysis yesterday, no issues with hypotension.  Started on amlodipine - bp improved overnight and this morning.   Objective   Vitals:   05/15/20 1212 05/15/20 1519 05/15/20 2105 05/16/20 0550  BP: (!) 147/83 (!) 143/81 117/67 126/79  Pulse:  90 66 64  Resp:  18 20 18   Temp:  98 F (36.7 C) 99 F (37.2 C) 97.8 F (36.6 C)  TempSrc:  Oral Oral Oral  SpO2:  96% 93% 97%  Weight:      Height:        Intake/Output Summary (Last 24 hours) at 05/16/2020 2353 Last data filed at 05/16/2020 0200 Gross per 24 hour  Intake 120 ml  Output 1873 ml  Net -1753 ml   Filed Weights   05/15/20 0611 05/15/20 0750 05/15/20 1200  Weight: 120.9 kg 121.3 kg 118.4 kg    Physical Exam   General appearance: alert and no distress Neck: no carotid bruit, no JVD and thyroid not enlarged, symmetric, no tenderness/mass/nodules Lungs: clear to auscultation bilaterally Heart: regular rate and rhythm, S1, S2 normal, no murmur, click, rub or gallop Abdomen: soft, non-tender; bowel sounds normal; no masses,  no organomegaly Extremities: extremities normal, atraumatic, no cyanosis or edema Pulses: 2+ and symmetric Skin: Skin color, texture, turgor normal. No rashes or lesions Neurologic: Grossly normal Psych: Pleasant  Inpatient Medications    Scheduled Meds: . amLODipine  5 mg Oral Daily  . cinacalcet  30 mg Oral BID AC  . doxercalciferol  5  mcg Intravenous Q T,Th,Sa-HD  . insulin aspart  0-6 Units Subcutaneous TID WC  . [START ON 05/18/2020] iron polysaccharides  150 mg Oral Daily  . multivitamin  1 tablet Oral QHS  . sevelamer carbonate  800 mg Oral TID WC    Continuous Infusions:   PRN Meds: acetaminophen **OR** acetaminophen, calcium carbonate (dosed in mg elemental calcium), camphor-menthol **AND** hydrOXYzine, docusate sodium, feeding supplement (NEPRO CARB STEADY), fentaNYL (SUBLIMAZE) injection, hydrALAZINE, ondansetron **OR** ondansetron (ZOFRAN) IV, sorbitol, zolpidem   Labs   Results for orders placed or performed during the hospital encounter of 05/13/20 (from the past 48 hour(s))  CBG monitoring, ED     Status: Abnormal   Collection Time: 05/14/20  7:48 AM  Result Value Ref Range   Glucose-Capillary 64 (L) 70 - 99 mg/dL    Comment: Glucose reference range applies only to samples taken after fasting for at least 8 hours.  CBG monitoring, ED     Status: None   Collection Time: 05/14/20 10:31 AM  Result Value Ref Range   Glucose-Capillary 75 70 - 99 mg/dL    Comment: Glucose reference range applies only to samples taken after fasting for at least 8 hours.  Glucose, capillary     Status: Abnormal   Collection Time: 05/14/20  1:40 PM  Result Value Ref Range   Glucose-Capillary 121 (H) 70 - 99 mg/dL    Comment: Glucose reference range applies only  to samples taken after fasting for at least 8 hours.  MRSA PCR Screening     Status: None   Collection Time: 05/14/20  3:18 PM   Specimen: Nasopharyngeal  Result Value Ref Range   MRSA by PCR NEGATIVE NEGATIVE    Comment:        The GeneXpert MRSA Assay (FDA approved for NASAL specimens only), is one component of a comprehensive MRSA colonization surveillance program. It is not intended to diagnose MRSA infection nor to guide or monitor treatment for MRSA infections. Performed at [facility], [place] [address], [place], [place] [address]   Glucose,  capillary     Status: Abnormal   Collection Time: 05/14/20  4:28 PM  Result Value Ref Range   Glucose-Capillary 113 (H) 70 - 99 mg/dL    Comment: Glucose reference range applies only to samples taken after fasting for at least 8 hours.  Glucose, capillary     Status: Abnormal   Collection Time: 05/14/20 10:04 PM  Result Value Ref Range   Glucose-Capillary 144 (H) 70 - 99 mg/dL    Comment: Glucose reference range applies only to samples taken after fasting for at least 8 hours.  Protime-INR     Status: Abnormal   Collection Time: 05/15/20  1:51 AM  Result Value Ref Range   Prothrombin Time 24.9 (H) 11.4 - 15.2 seconds   INR 2.3 (H) 0.8 - 1.2    Comment: (NOTE) INR goal varies based on device and disease states. Performed at [facility], [place] [address] [place], [place] [address]   CBC     Status: Abnormal   Collection Time: 05/15/20  1:51 AM  Result Value Ref Range   WBC 4.9 4.0 - 10.5 K/uL   RBC 3.69 (L) 4.22 - 5.81 MIL/uL   Hemoglobin 11.2 (L) 13.0 - 17.0 g/dL   HCT 34.3 (L) 39.0 - 52.0 %   MCV 93.0 80.0 - 100.0 fL   MCH 30.4 26.0 - 34.0 pg   MCHC 32.7 30.0 - 36.0 g/dL   RDW 15.9 (H) 11.5 - 15.5 %   Platelets 140 (L) 150 - 400 K/uL   nRBC 0.0 0.0 - 0.2 %    Comment: Performed at [facility], [place] [address] [place], [place] [address]  Comprehensive metabolic panel     Status: Abnormal   Collection Time: 05/15/20  1:51 AM  Result Value Ref Range   Sodium 140 135 - 145 mmol/L   Potassium 4.6 3.5 - 5.1 mmol/L   Chloride 98 98 - 111 mmol/L   CO2 23 22 - 32 mmol/L   Glucose, Bld 133 (H) 70 - 99 mg/dL    Comment: Glucose reference range applies only to samples taken after fasting for at least 8 hours.   BUN 70 (H) 8 - 23 mg/dL   Creatinine, Ser 12.66 (H) 0.61 - 1.24 mg/dL   Calcium 7.9 (L) 8.9 - 10.3 mg/dL   Total Protein 6.4 (L) 6.5 - 8.1 g/dL   Albumin 3.0 (L) 3.5 - 5.0 g/dL   AST 26 15 - 41 U/L   ALT 30 0 - 44 U/L   Alkaline Phosphatase 141 (H) 38 - 126  U/L   Total Bilirubin 1.2 0.3 - 1.2 mg/dL   GFR, Estimated 4 (L) >60 mL/min    Comment: (NOTE) Calculated using the CKD-EPI Creatinine Equation (2021)    Anion gap 19 (H) 5 - 15    Comment: Performed at [facility], [place] [address] [place], [place] [address]  Glucose, capillary     Status: Abnormal   Collection Time: 05/15/20  6:49 AM  Result Value Ref Range   Glucose-Capillary 107 (H) 70 - 99 mg/dL    Comment: Glucose reference range applies only to samples taken after fasting for at least 8 hours.  Glucose, capillary     Status: Abnormal   Collection Time: 05/15/20  4:34 PM  Result Value Ref Range   Glucose-Capillary 140 (H) 70 - 99 mg/dL    Comment: Glucose reference range applies only to samples taken after fasting for at least 8 hours.  Glucose, capillary     Status: Abnormal   Collection Time: 05/15/20  8:47 PM  Result Value Ref Range   Glucose-Capillary 114 (H) 70 - 99 mg/dL    Comment: Glucose reference range applies only to samples taken after fasting for at least 8 hours.  Protime-INR     Status: Abnormal   Collection Time: 05/16/20  3:23 AM  Result Value Ref Range   Prothrombin Time 21.1 (H) 11.4 - 15.2 seconds   INR 1.9 (H) 0.8 - 1.2    Comment: (NOTE) INR goal varies based on device and disease states. Performed at [facility], [place] [address] [place], [place] [address]   CBC     Status: Abnormal   Collection Time: 05/16/20  3:23 AM  Result Value Ref Range   WBC 4.3 4.0 - 10.5 K/uL   RBC 3.84 (L) 4.22 - 5.81 MIL/uL   Hemoglobin 11.8 (L) 13.0 - 17.0 g/dL   HCT 35.6 (L) 39.0 - 52.0 %   MCV 92.7 80.0 - 100.0 fL   MCH 30.7 26.0 - 34.0 pg   MCHC 33.1 30.0 - 36.0 g/dL   RDW 15.9 (H) 11.5 - 15.5 %   Platelets 128 (L) 150 - 400 K/uL    Comment: REPEATED TO VERIFY   nRBC 0.0 0.0 - 0.2 %    Comment: Performed at [facility], [place] [address] [place], [place] [address]  Glucose, capillary     Status: None   Collection Time: 05/16/20  6:41 AM   Result Value Ref Range   Glucose-Capillary 80 70 - 99 mg/dL    Comment: Glucose reference range applies only to samples taken after fasting for at least 8 hours.    ECG   N/A  Telemetry   N/A  Radiology    No results found.  Cardiac Studies   N/A  Assessment   Principal Problem: 1.   Cholelithiasis without obstruction 2. Active Problems: 3.   Hyperlipidemia 4.   ESRD (end stage renal disease) ([place]) 5.   Systolic heart failure ([place]) 6.   Hypotension 7.   Class 1 obesity due to excess calories with body mass index (BMI) of 33.0 to 33.9 in adult  Plan   Mr. Helwig seems to have better blood pressure control overnight - have not yet had maximal effects of amlodipine - monitor and continue current dose. INR 1.9 today- waiting to drift down for planned ERCP with GI.   Time Spent Directly with Patient:  I have spent a total of 25 minutes with the patient reviewing hospital notes, telemetry, EKGs, labs and examining the patient as well as establishing an assessment and plan that was discussed personally with the patient.  > 50% of time was spent in direct patient care.  Length of Stay:  LOS: 3 days   Chrissie Noa  C. Debara Pickett, MD, [facility], [place] Director of the Advanced Lipid Disorders &  Cardiovascular Risk Reduction Clinic Diplomate of the American Board of Clinical Lipidology Attending Cardiologist  Direct Dial: [phone]  Fax: [phone]  Website:  [email or web address] Leard 05/16/2020, 7:05 AM

## 2020-05-16 NOTE — Progress Notes (Signed)
[place] for Heparin Indication: atrial fibrillation  No Known Allergies  Patient Measurements: Height: 6' 3.5" (191.8 cm) Weight: 118.4 kg (261 lb 0.4 oz) IBW/kg (Calculated) : 85.65 Heparin Dosing Weight: 111.7 kg  Vital Signs: Temp: 97.8 F (36.6 C) (12/16 0550) Temp Source: Oral (12/16 0550) BP: 126/79 (12/16 0550) Pulse Rate: 64 (12/16 0550)  Labs: Recent Labs    05/13/20 1017 05/13/20 1739 05/14/20 0429 05/15/20 0151 05/16/20 0323  HGB 11.6*  --  12.5* 11.2* 11.8*  HCT 37.1*  --  39.3 34.3* 35.6*  PLT 154  --  135* 140* 128*  LABPROT  --    < > 24.9* 24.9* 21.1*  INR  --    < > 2.3* 2.3* 1.9*  CREATININE 9.65*  --  11.08* 12.66*  --    < > = values in this interval not displayed.    Estimated Creatinine Clearance: 7.9 mL/min (A) (by C-G formula based on SCr of 12.66 mg/dL (H)).   Medical History: Past Medical History:  Diagnosis Date  . Anemia   . Arthritis    HNP- lumbar, "all over my body"  . Blood transfusion    "years ago; blood was low" (08/05/2013)  . Diabetic nephropathy ([place])   . Diabetic retinopathy   . DVT (deep venous thrombosis) ([place])    "got one in my right leg now; I've had one before too, not sure which leg" (08/05/2013)  . ESRD (end stage renal disease) on dialysis ([place] [facility] [place])    TTS, [place]  . Family history of anesthesia complication    " my son wakes up slowly"  . GERD (gastroesophageal reflux disease)    uses alka seltzere on occas.   [name])    "one q now and then" (08/05/2013)  . Hyperlipidemia   . Hypertension   . 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 Non Hodgkin's lymphoma ([place])    Tx 2009; "had chemo; it went away" (08/05/2013)  . Noncompliance 03/16/2012  . NSVT (nonsustained ventricular tachycardia) ([place]) 03/18/2012  . Peripheral vascular disease ([place])   . Poor historian    pt. unsure of several  answers to health history questions   . Skin cancer    melanoma - head  . Sleep apnea    "suppose to have a sleep study, but they never told me when. (08/05/2013)    Medications:  Scheduled:  . amLODipine  5 mg Oral Daily  . cinacalcet  30 mg Oral BID AC  . doxercalciferol  5 mcg Intravenous Q T,Th,Sa-HD  . insulin aspart  0-6 Units Subcutaneous TID WC  . [START ON 05/18/2020] iron polysaccharides  150 mg Oral Daily  . multivitamin  1 tablet Oral QHS  . sevelamer carbonate  800 mg Oral TID WC    Assessment: Patient is a 69 yo male that is being admitted for Cholelithiasis, CBD dilatation. The patient is on warfarin at home for afib. At this time the patient's INR continues to be therapeutic at 2.3. Pharmacy has been asked to dose heparin in this patient for afib while holding warfarin.   INR is now <2 so we will start heparin bridging today. Plan for ERCP when INR drifts down a bit further.   Hgb 11.6, plt low 128   Goal of Therapy:  Heparin level 0.3-0.7 units/ml Monitor platelets by anticoagulation protocol: Yes   Plan:  Continue to hold  warfarin  Heparin at 1650 units/hr Check 8 hr HL then daily Monitor heparin level and CBC daily  Onnie Boer, PharmD, [place], AAHIVP, CPP Infectious Disease Pharmacist 05/16/2020 7:18 AM

## 2020-05-16 NOTE — Progress Notes (Signed)
PROGRESS NOTE    [name]  KGU:[id number] DOB: 1951-02-21 DOA: 05/13/2020 PCP: Burnard Bunting, MD   Brief Narrative: 69 year old with past medical history significant for peripheral vascular disease a status post right transmetatarsal amputation, and non Hodgkin lymphoma, type 2 diabetes mellitus, hypertension, hyperlipidemia, chronic systolic congestive heart failure, A. fib on Coumadin, history of pericardial tamponade status post window, OSA not on CPAP, ESRD on hemodialysis TTS who presented from general surgery office with persistent abdominal pain over the past 2 or 3 weeks.  Patient report pain as intermittent, not worse after eating.  Patient denies any fever chills night sweats, nausea vomiting.  Evaluation in the ED vitals are stable, potassium 4.3, chloride 96, CO2 28, glucose 130, BUN 52, creatinine 9.6, glucose 130, lipase 43.  Bilirubin 0.8.  White blood cell 5.8, hemoglobin 11.6, platelets 154.  COVID-19 influenza negative.  Right upper quadrant ultrasound with large solitary gallstone without evidence of cholecystitis, CBD 10 mm consistent with mild dilation, increase echogenicity hepatic parenchyma, possible right nephrolithiasis.  GI, general surgery were consulted.  Hospitalist service consulted for admission and further evaluation and management.   Assessment & Plan:   Principal Problem:   Cholelithiasis without obstruction Active Problems:   Hyperlipidemia   ESRD (end stage renal disease) ([facility])   Systolic heart failure ([facility])   Hypotension   Class 1 obesity due to excess calories with body mass index (BMI) of 33.0 to 33.9 in adult  1-Choledocholithiasis: -Patient presented to the ED from general surgery office with persistent abdominal pain over the last 2 or 3 weeks. Patient was found to have CBD ductal dilation with a gallstone within the gallbladder on  right upper quadrant ultrasound. -CT renal was done.if with choledocholithiasis with a small stone distal  common bile duct measuring up to 8 mm with biliary ductal dilation, mild gallbladder distention and intraluminal gallstone with pericholecystic inflammation -[place], GI and general surgery following. -Plan for ERCP tomorrow. INR down to 1.9 today.  -Currently tolerating diet. Report mild abdominal pain.  -he will need cholecystectomy during this admission.   2-Chronic Systolic Congestive Heart Failure, nonischemic cardiomyopathy, essential hypertension History of left ventricular ejection fraction 30 to 35% with improvement to 50 to 55% in 2020 Patient was evaluated by cardiology on 05/13/2020 for preoperative evaluation.  No indication for further testing at this time. Currently holding Coumadin, plan to bridge with heparin. Not on ACE or ARB or beta-blocker at home due to history of hypotension.  3-Hypotension;  He was on midodrine, currently on hold due to elevated BP.    4-Paroxysmal A. fib/flutter: On anticoagulation Holding Coumadin pending ERCP. Heparin drip to be managed by pharmacy  5-ESRD on hemodialysis: Continue with dialysis TTS. Appreciate nephrology follow-up  History of non-Hodgkin lymphoma status post chemo in remission, history of sarcomatoid tumor scalp: Follow-up with oncology as an outpatient.  HTN; started on BP medications this admission. Monitor.  BP improved on Norvasc.    Estimated body mass index is 32.28 kg/m as calculated from the following:   Height as of this encounter: 6' 3.5" (1.918 m).   Weight as of this encounter: 118.7 kg.   DVT prophylaxis: scd Code Status: Full Code Family Communication: care discussed with  Disposition Plan:  Status is: Inpatient  Remains inpatient appropriate because:Ongoing active pain requiring inpatient pain management and IV treatments appropriate due to intensity of illness or inability to take PO   Dispo:  Patient From: Home  Planned Disposition: Home  Expected discharge date: 05/18/2020  Medically  stable for discharge: No, plan for ERCP on 12/17         Consultants:   GI  Nephrology  Procedures:   None  Antimicrobials:    Subjective: Patient denies dyspnea.  Right side abdominal pain is stable. Not worse.  Denies nausea.   Objective: Vitals:   05/16/20 1035 05/16/20 1100 05/16/20 1130 05/16/20 1200  BP: 139/70 137/83 (!) 148/90 (!) 148/92  Pulse: 75 77 70 92  Resp:      Temp:      TempSrc:      SpO2:      Weight:      Height:        Intake/Output Summary (Last 24 hours) at 05/16/2020 1322 Last data filed at 05/16/2020 0849 Gross per 24 hour  Intake 240 ml  Output 0 ml  Net 240 ml   Filed Weights   05/15/20 1200 05/15/20 2105 05/16/20 1030  Weight: 118.4 kg 118.5 kg 118.7 kg    Examination:  General exam: NAD Respiratory system: CTA Cardiovascular system: S 1, S 2 RRR Gastrointestinal system: BS present, soft, mild right side abdominal pain Central nervous system: alert Extremities: trace edema   Data Reviewed: I have personally reviewed following labs and imaging studies  CBC: Recent Labs  Lab 05/13/20 1017 05/14/20 0429 05/15/20 0151 05/16/20 0323 05/16/20 1048  WBC 5.8 5.8 4.9 4.3 4.6  HGB 11.6* 12.5* 11.2* 11.8* 11.2*  HCT 37.1* 39.3 34.3* 35.6* 34.8*  MCV 96.9 95.4 93.0 92.7 94.6  PLT 154 135* 140* 128* 174*   Basic Metabolic Panel: Recent Labs  Lab 05/13/20 1017 05/14/20 0429 05/15/20 0151 05/16/20 1048  NA 142 141 140 136  K 4.3 4.7 4.6 4.1  CL 96* 99 98 94*  CO2 28 24 23 25   GLUCOSE 130* 73 133* 111*  BUN 52* 60* 70* 37*  CREATININE 9.65* 11.08* 12.66* 9.14*  CALCIUM 8.4* 8.3* 7.9* 8.1*  PHOS  --   --   --  4.8*   GFR: Estimated Creatinine Clearance: 11 mL/min (A) (by C-G formula based on SCr of 9.14 mg/dL (H)). Liver Function Tests: Recent Labs  Lab 05/13/20 1017 05/14/20 0429 05/15/20 0151 05/16/20 1048  AST 24 28 26   --   ALT 27 26 30   --   ALKPHOS 121 134* 141*  --   BILITOT 0.8 1.3* 1.2  --    PROT 7.2 7.0 6.4*  --   ALBUMIN 3.4* 3.3* 3.0* 3.1*   Recent Labs  Lab 05/13/20 1017  LIPASE 43   No results for input(s): AMMONIA in the last 168 hours. Coagulation Profile: Recent Labs  Lab 05/13/20 1739 05/14/20 0429 05/15/20 0151 05/16/20 0323  INR 2.4* 2.3* 2.3* 1.9*   Cardiac Enzymes: No results for input(s): CKTOTAL, CKMB, CKMBINDEX, TROPONINI in the last 168 hours. BNP (last 3 results) No results for input(s): PROBNP in the last 8760 hours. HbA1C: Recent Labs    05/13/20 1741  HGBA1C 7.2*   CBG: Recent Labs  Lab 05/14/20 2204 05/15/20 0649 05/15/20 1634 05/15/20 2047 05/16/20 0641  GLUCAP 144* 107* 140* 114* 80   Lipid Profile: No results for input(s): CHOL, HDL, LDLCALC, TRIG, CHOLHDL, LDLDIRECT in the last 72 hours. Thyroid Function Tests: No results for input(s): TSH, T4TOTAL, FREET4, T3FREE, THYROIDAB in the last 72 hours. Anemia Panel: No results for input(s): VITAMINB12, FOLATE, FERRITIN, TIBC, IRON, RETICCTPCT in the last 72 hours. Sepsis Labs: No results for input(s): PROCALCITON, LATICACIDVEN in the last 168 hours.  Recent Results (from the past 240 hour(s))  Resp Panel by RT-PCR (Flu A&B, Covid) Nasopharyngeal Swab     Status: None   Collection Time: 05/13/20  4:07 PM   Specimen: Nasopharyngeal Swab; Nasopharyngeal(NP) swabs in vial transport medium  Result Value Ref Range Status   SARS Coronavirus 2 by RT PCR NEGATIVE NEGATIVE Final    Comment: (NOTE) SARS-CoV-2 target nucleic acids are NOT DETECTED.  The SARS-CoV-2 RNA is generally detectable in upper respiratory specimens during the acute phase of infection. The lowest concentration of SARS-CoV-2 viral copies this assay can detect is 138 copies/mL. A negative result does not preclude SARS-Cov-2 infection and should not be used as the sole basis for treatment or other patient management decisions. A negative result may occur with  improper specimen collection/handling, submission of  specimen other than nasopharyngeal swab, presence of viral mutation(s) within the areas targeted by this assay, and inadequate number of viral copies(<138 copies/mL). A negative result must be combined with clinical observations, patient history, and epidemiological information. The expected result is Negative.  Fact Sheet for Patients:  EntrepreneurPulse.com.au  Fact Sheet for Healthcare Providers:  IncredibleEmployment.be  This test is no t yet approved or cleared by the [place] FDA and  has been authorized for detection and/or diagnosis of SARS-CoV-2 by FDA under an Emergency Use Authorization (EUA). This EUA will remain  in effect (meaning this test can be used) for the duration of the COVID-19 declaration under Section 564(b)(1) of the Act, 21 U.S.C.section 360bbb-3(b)(1), unless the authorization is terminated  or revoked sooner.       Influenza A by PCR NEGATIVE NEGATIVE Final   Influenza B by PCR NEGATIVE NEGATIVE Final    Comment: (NOTE) The Xpert Xpress SARS-CoV-2/FLU/RSV plus assay is intended as an aid in the diagnosis of influenza from Nasopharyngeal swab specimens and should not be used as a sole basis for treatment. Nasal washings and aspirates are unacceptable for Xpert Xpress SARS-CoV-2/FLU/RSV testing.  Fact Sheet for Patients: EntrepreneurPulse.com.au  Fact Sheet for Healthcare Providers: IncredibleEmployment.be  This test is not yet approved or cleared by the [place] FDA and has been authorized for detection and/or diagnosis of SARS-CoV-2 by FDA under an Emergency Use Authorization (EUA). This EUA will remain in effect (meaning this test can be used) for the duration of the COVID-19 declaration under Section 564(b)(1) of the Act, 21 U.S.C. section 360bbb-3(b)(1), unless the authorization is terminated or revoked.  Performed at [facility], [place] [address],  [place], [place] [address]   MRSA PCR Screening     Status: None   Collection Time: 05/14/20  3:18 PM   Specimen: Nasopharyngeal  Result Value Ref Range Status   MRSA by PCR NEGATIVE NEGATIVE Final    Comment:        The GeneXpert MRSA Assay (FDA approved for NASAL specimens only), is one component of a comprehensive MRSA colonization surveillance program. It is not intended to diagnose MRSA infection nor to guide or monitor treatment for MRSA infections. Performed at [facility], [place] [address] [place], [place] [address]          Radiology Studies: No results found.      Scheduled Meds: . amLODipine  5 mg Oral Daily  . cinacalcet  30 mg Oral BID AC  . doxercalciferol  5 mcg Intravenous Q T,Th,Sa-HD  . insulin aspart  0-6 Units Subcutaneous TID WC  . [START ON 05/18/2020] iron polysaccharides  150 mg Oral Daily  .  multivitamin  1 tablet Oral QHS  . sevelamer carbonate  800 mg Oral TID WC   Continuous Infusions: . heparin       LOS: 3 days    Time spent: 35 minutes.     Elmarie Shiley, MD [facility]   If 7PM-7AM, please contact night-coverage [email or web address]  05/16/2020, 1:22 PM

## 2020-05-16 NOTE — Progress Notes (Signed)
[facility] Progress Note     Subjective: CC-  Sitting up in bed eating breakfast. Minimal RUQ pain today. Denies n/v. BM yesterday. INR 1.9 Scheduled for ERCP tomorrow.  Objective: Vital signs in last 24 hours: Temp:  [97.8 F (36.6 C)-99 F (37.2 C)] 97.8 F (36.6 C) (12/16 0550) Pulse Rate:  [64-98] 64 (12/16 0550) Resp:  [13-20] 18 (12/16 0550) BP: (112-168)/(67-99) 126/79 (12/16 0550) SpO2:  [92 %-97 %] 97 % (12/16 0550) Weight:  [118.4 kg-121.3 kg] 118.4 kg (12/15 1200) Last BM Date: 05/13/20  Intake/Output from previous day: 12/15 0701 - 12/16 0700 In: 120 [P.O.:120] Out: 1873  Intake/Output this shift: No intake/output data recorded.  PE: Gen:  Alert, NAD, pleasant Heart: irregular Lungs: CTAB, no wheezes, rhonchi, or rales noted.  Respiratory effort nonlabored Abd: soft, very mild ttp RUQ, ND, +BS, no masses, hernias, or organomegaly Psych: A&Ox3 with an appropriate affect. Skin: no rashes noted, warm and dry  Lab Results:  Recent Labs    05/15/20 0151 05/16/20 0323  WBC 4.9 4.3  HGB 11.2* 11.8*  HCT 34.3* 35.6*  PLT 140* 128*   BMET Recent Labs    05/14/20 0429 05/15/20 0151  NA 141 140  K 4.7 4.6  CL 99 98  CO2 24 23  GLUCOSE 73 133*  BUN 60* 70*  CREATININE 11.08* 12.66*  CALCIUM 8.3* 7.9*   PT/INR Recent Labs    05/15/20 0151 05/16/20 0323  LABPROT 24.9* 21.1*  INR 2.3* 1.9*   CMP     Component Value Date/Time   NA 140 05/15/2020 0151   NA 142 09/04/2016 1628   K 4.6 05/15/2020 0151   CL 98 05/15/2020 0151   CO2 23 05/15/2020 0151   GLUCOSE 133 (H) 05/15/2020 0151   BUN 70 (H) 05/15/2020 0151   BUN 22 09/04/2016 1628   CREATININE 12.66 (H) 05/15/2020 0151   CALCIUM 7.9 (L) 05/15/2020 0151   CALCIUM 9.1 01/09/2013 0903   PROT 6.4 (L) 05/15/2020 0151   ALBUMIN 3.0 (L) 05/15/2020 0151   AST 26 05/15/2020 0151   ALT 30 05/15/2020 0151   ALKPHOS 141 (H) 05/15/2020 0151   BILITOT 1.2 05/15/2020 0151    GFRNONAA 4 (L) 05/15/2020 0151   GFRAA 16 (L) 07/16/2018 2320   Lipase     Component Value Date/Time   LIPASE 43 05/13/2020 1017       Studies/Results: No results found.  Anti-infectives: Anti-infectives (From admission, onward)   None       Assessment/Plan ESRD on HD T/Th/Sat Hypotension - more hypertension now, not requiring midodrine with HD, started on amlodipine 5mg  qd IDDM HLD PVD H/o DVT with filter on Coumadin Hx Non-Hodgkin's lymphoma 2009 s/p chemotherapy Non-ischemic cardiomyopathy with HF (improvd EF 50-55% on ECHO 04/2019) Atrial fibrillation H/o pericardial effusion s/p pericardial window 2019   Choledocholithiasis -CT from 12/13 confirmed choledocholithiasis. GI has seen the patient and recommend ERCP once INR down. Continue holding coumadin, he is on heparin gtt. - Cardiology has seen and deemed "acceptable risk for surgery if necessary.He will need of course to hold warfarin for up to 5 days with transition to IV heparin per pharmacy. He denies any anginal symptoms or worsening shortness of breath. LVEF is near normal based on echo last year. No indications for further testing at this time."  Yesterday "Will need to restart anti-hypertensive medication and observe. Recommend starting amlodipine 5 mg daily, which can start after dialysis today. It is important to have better  BP control prior to possible surgery." - Due to multiple medical problems and patient holding coumadin for ERCP, beneficial to consider laparoscopic cholecystectomy this admission once CBD stones are taken care of. Will discuss with GI if this would be possible to do combination ERCP and lap chole, vs ERCP tomorrow and lap chole the following day.  ID - none FEN -renal/CM diet, NPO after MN Foley - none VTE - heparin gtt, INR 1.9 today  Follow up - TBD   LOS: 3 days    Wellington [place], [facility] 05/16/2020, 7:38 AM Please see Amion for pager number  during day hours 7:00am-4:30pm

## 2020-05-16 NOTE — Progress Notes (Addendum)
ANTICOAGULATION CONSULT NOTE  Pharmacy Consult for Heparin Indication: atrial fibrillation  No Known Allergies  Patient Measurements: Height: 6' 3.5" (191.8 cm) Weight: 116.4 kg (256 lb 9.9 oz) IBW/kg (Calculated) : 85.65 Heparin Dosing Weight: 111.7 kg  Vital Signs: Temp: 97.6 F (36.4 C) (12/16 1707) Temp Source: Oral (12/16 1707) BP: 109/68 (12/16 1707) Pulse Rate: 68 (12/16 1707)  Labs: Recent Labs    05/14/20 0429 05/15/20 0151 05/16/20 0323 05/16/20 1048 05/16/20 1810  HGB 12.5* 11.2* 11.8* 11.2*  --   HCT 39.3 34.3* 35.6* 34.8*  --   PLT 135* 140* 128* 139*  --   LABPROT 24.9* 24.9* 21.1*  --   --   INR 2.3* 2.3* 1.9*  --   --   HEPARINUNFRC  --   --   --   --  0.41  CREATININE 11.08* 12.66*  --  9.14*  --     Estimated Creatinine Clearance: 10.9 mL/min (A) (by C-G formula based on SCr of 9.14 mg/dL (H)).   Medical History: Past Medical History:  Diagnosis Date  . Anemia   . Arthritis    HNP- lumbar, "all over my body"  . Blood transfusion    "years ago; blood was low" (08/05/2013)  . Diabetic nephropathy ([place])   . Diabetic retinopathy   . DVT (deep venous thrombosis) ([place])    "got one in my right leg now; I've had one before too, not sure which leg" (08/05/2013)  . ESRD (end stage renal disease) on dialysis ([place])    TTS, [place]  . Family history of anesthesia complication    " my son wakes up slowly"  . GERD (gastroesophageal reflux disease)    uses alka seltzere on occas.   [name])    "one q now and then" (08/05/2013)  . Hyperlipidemia   . Hypertension   . 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 Non Hodgkin's lymphoma ([place])    Tx 2009; "had chemo; it went away" (08/05/2013)  . Noncompliance 03/16/2012  . NSVT (nonsustained ventricular tachycardia) ([place]) 03/18/2012  . Peripheral vascular disease ([place])   . Poor historian    pt. unsure of several answers to  health history questions   . Skin cancer    melanoma - head  . Sleep apnea    "suppose to have a sleep study, but they never told me when. (08/05/2013)    Medications:  Scheduled:  . amLODipine  5 mg Oral Daily  . cinacalcet  30 mg Oral BID AC  . doxercalciferol  5 mcg Intravenous Q T,Th,Sa-HD  . insulin aspart  0-6 Units Subcutaneous TID WC  . [START ON 05/18/2020] iron polysaccharides  150 mg Oral Daily  . multivitamin  1 tablet Oral QHS  . sevelamer carbonate  800 mg Oral TID WC    Assessment: Patient is a 69 yo male that is being admitted for Cholelithiasis, CBD dilatation. The patient is on warfarin at home for afib. At this time the patient's INR continues to be therapeutic at 2.3. Pharmacy has been asked to dose heparin in this patient for afib while holding warfarin.   INR is now <2 so started heparin bridging today. Plan for ERCP when INR drifts down a bit further.   Initial heparin level came back therapeutic at 0.41, on 1650 units/hr. Hgb 11.2, plt 139. No s/sx of bleeding or infusion issues per nursing.    Goal  of Therapy:  Heparin level 0.3-0.7 units/ml Monitor platelets by anticoagulation protocol: Yes   Plan:  Continue to hold warfarin  Continue heparin at 1650 units/hr Monitor heparin level and CBC daily Stop heparin at 0815 on 12/17 for ERCP  Antonietta Jewel, PharmD, [place] Pharmacist  Phone: [phone] 05/16/2020 7:09 PM  Please check AMION for all [place] phone numbers After 10:00 PM, call [place] [phone]

## 2020-05-17 ENCOUNTER — Encounter (HOSPITAL_COMMUNITY): Payer: Self-pay | Admitting: Internal Medicine

## 2020-05-17 ENCOUNTER — Encounter (HOSPITAL_COMMUNITY)
Admission: EM | Disposition: A | Discharge status: Home Health | Payer: Self-pay | Source: Home / Self Care | Attending: Internal Medicine

## 2020-05-17 ENCOUNTER — Inpatient Hospital Stay (HOSPITAL_COMMUNITY): Payer: Medicare Other

## 2020-05-17 ENCOUNTER — Inpatient Hospital Stay (HOSPITAL_COMMUNITY): Payer: Medicare Other | Admitting: Certified Registered"

## 2020-05-17 DIAGNOSIS — K2970 Gastritis, unspecified, without bleeding: Secondary | ICD-10-CM

## 2020-05-17 HISTORY — PX: ENDOSCOPIC RETROGRADE CHOLANGIOPANCREATOGRAPHY (ERCP) WITH PROPOFOL: SHX5810

## 2020-05-17 HISTORY — PX: SPHINCTEROTOMY: SHX5279

## 2020-05-17 HISTORY — PX: REMOVAL OF STONES: SHX5545

## 2020-05-17 HISTORY — PX: BIOPSY: SHX5522

## 2020-05-17 LAB — COMPREHENSIVE METABOLIC PANEL
ALT: 32 U/L (ref 0–44)
AST: 26 U/L (ref 15–41)
Albumin: 3.1 g/dL — ABNORMAL LOW (ref 3.5–5.0)
Alkaline Phosphatase: 139 U/L — ABNORMAL HIGH (ref 38–126)
Anion gap: 15 (ref 5–15)
BUN: 26 mg/dL — ABNORMAL HIGH (ref 8–23)
CO2: 23 mmol/L (ref 22–32)
Calcium: 8.1 mg/dL — ABNORMAL LOW (ref 8.9–10.3)
Chloride: 98 mmol/L (ref 98–111)
Creatinine, Ser: 7.06 mg/dL — ABNORMAL HIGH (ref 0.61–1.24)
GFR, Estimated: 8 mL/min — ABNORMAL LOW (ref >60)
Glucose, Bld: 86 mg/dL (ref 70–99)
Potassium: 4 mmol/L (ref 3.5–5.1)
Sodium: 136 mmol/L (ref 135–145)
Total Bilirubin: 1.3 mg/dL — ABNORMAL HIGH (ref 0.3–1.2)
Total Protein: 6.9 g/dL (ref 6.5–8.1)

## 2020-05-17 LAB — GLUCOSE, CAPILLARY
Glucose-Capillary: 71 mg/dL (ref 70–99)
Glucose-Capillary: 82 mg/dL (ref 70–99)

## 2020-05-17 LAB — CBC
HCT: 36.4 % — ABNORMAL LOW (ref 39.0–52.0)
Hemoglobin: 11.7 g/dL — ABNORMAL LOW (ref 13.0–17.0)
MCH: 29.9 pg (ref 26.0–34.0)
MCHC: 32.1 g/dL (ref 30.0–36.0)
MCV: 93.1 fL (ref 80.0–100.0)
Platelets: 124 10*3/uL — ABNORMAL LOW (ref 150–400)
RBC: 3.91 MIL/uL — ABNORMAL LOW (ref 4.22–5.81)
RDW: 15.6 % — ABNORMAL HIGH (ref 11.5–15.5)
WBC: 4.4 10*3/uL (ref 4.0–10.5)
nRBC: 0 % (ref 0.0–0.2)

## 2020-05-17 LAB — PROTIME-INR
INR: 1.6 — ABNORMAL HIGH (ref 0.8–1.2)
Prothrombin Time: 18.6 seconds — ABNORMAL HIGH (ref 11.4–15.2)

## 2020-05-17 LAB — HEPARIN LEVEL (UNFRACTIONATED): Heparin Unfractionated: 0.58 IU/mL (ref 0.30–0.70)

## 2020-05-17 SURGERY — ENDOSCOPIC RETROGRADE CHOLANGIOPANCREATOGRAPHY (ERCP) WITH PROPOFOL
Anesthesia: General

## 2020-05-17 MED ORDER — ROCURONIUM BROMIDE 10 MG/ML (PF) SYRINGE
PREFILLED_SYRINGE | INTRAVENOUS | Status: DC | PRN
  Administered 2020-05-17: 50 mg via INTRAVENOUS

## 2020-05-17 MED ORDER — PANTOPRAZOLE SODIUM 40 MG PO TBEC
40.0000 mg | DELAYED_RELEASE_TABLET | Freq: Every day | ORAL | Status: DC
  Administered 2020-05-18 – 2020-05-20 (×3): 40 mg via ORAL
  Filled 2020-05-17 (×3): qty 1

## 2020-05-17 MED ORDER — PROMETHAZINE HCL 25 MG/ML IJ SOLN
12.5000 mg | Freq: Four times a day (QID) | INTRAMUSCULAR | Status: DC | PRN
  Administered 2020-05-18: 12.5 mg via INTRAVENOUS
  Filled 2020-05-17 (×2): qty 1

## 2020-05-17 MED ORDER — ONDANSETRON HCL 4 MG/2ML IJ SOLN
4.0000 mg | Freq: Once | INTRAMUSCULAR | Status: AC
  Administered 2020-05-17: 4 mg via INTRAVENOUS

## 2020-05-17 MED ORDER — LIDOCAINE 2% (20 MG/ML) 5 ML SYRINGE
INTRAMUSCULAR | Status: DC | PRN
  Administered 2020-05-17: 60 mg via INTRAVENOUS

## 2020-05-17 MED ORDER — FENTANYL CITRATE (PF) 250 MCG/5ML IJ SOLN
INTRAMUSCULAR | Status: DC | PRN
  Administered 2020-05-17: 100 ug via INTRAVENOUS

## 2020-05-17 MED ORDER — CHLORHEXIDINE GLUCONATE CLOTH 2 % EX PADS
6.0000 | MEDICATED_PAD | Freq: Every day | CUTANEOUS | Status: DC
  Administered 2020-05-20 – 2020-05-23 (×2): 6 via TOPICAL

## 2020-05-17 MED ORDER — ONDANSETRON HCL 4 MG/2ML IJ SOLN
INTRAMUSCULAR | Status: AC
  Filled 2020-05-17: qty 2

## 2020-05-17 MED ORDER — PHENYLEPHRINE HCL-NACL 10-0.9 MG/250ML-% IV SOLN
INTRAVENOUS | Status: DC | PRN
  Administered 2020-05-17: 30 ug/min via INTRAVENOUS

## 2020-05-17 MED ORDER — ACETAMINOPHEN 500 MG PO TABS: 1000.0000 mg | ORAL_TABLET | ORAL | Status: DC

## 2020-05-17 MED ORDER — INDOMETHACIN 50 MG RE SUPP
RECTAL | Status: DC | PRN
  Administered 2020-05-17: 100 mg via RECTAL

## 2020-05-17 MED ORDER — CIPROFLOXACIN IN D5W 400 MG/200ML IV SOLN
INTRAVENOUS | Status: AC
  Filled 2020-05-17: qty 200

## 2020-05-17 MED ORDER — GABAPENTIN 300 MG PO CAPS: 300.0000 mg | ORAL_CAPSULE | ORAL | Status: DC

## 2020-05-17 MED ORDER — GLUCAGON HCL RDNA (DIAGNOSTIC) 1 MG IJ SOLR
INTRAMUSCULAR | Status: DC | PRN
  Administered 2020-05-17 (×3): .25 mg via INTRAVENOUS

## 2020-05-17 MED ORDER — INDOMETHACIN 50 MG RE SUPP
RECTAL | Status: AC
  Filled 2020-05-17: qty 2

## 2020-05-17 MED ORDER — ONDANSETRON HCL 4 MG/2ML IJ SOLN
INTRAMUSCULAR | Status: DC | PRN
  Administered 2020-05-17: 4 mg via INTRAVENOUS

## 2020-05-17 MED ORDER — CEFAZOLIN SODIUM-DEXTROSE 2-4 GM/100ML-% IV SOLN: 2.0000 g | INTRAVENOUS | Status: DC

## 2020-05-17 MED ORDER — SUCCINYLCHOLINE CHLORIDE 200 MG/10ML IV SOSY
PREFILLED_SYRINGE | INTRAVENOUS | Status: DC | PRN
  Administered 2020-05-17: 100 mg via INTRAVENOUS

## 2020-05-17 MED ORDER — SODIUM CHLORIDE 0.9 % IV SOLN
INTRAVENOUS | Status: DC | PRN
  Administered 2020-05-17: 25 mL

## 2020-05-17 MED ORDER — SUGAMMADEX SODIUM 200 MG/2ML IV SOLN
INTRAVENOUS | Status: DC | PRN
  Administered 2020-05-17: 300 mg via INTRAVENOUS

## 2020-05-17 MED ORDER — CIPROFLOXACIN IN D5W 400 MG/200ML IV SOLN
INTRAVENOUS | Status: DC | PRN
  Administered 2020-05-17: 400 mg via INTRAVENOUS

## 2020-05-17 MED ORDER — SODIUM CHLORIDE 0.9 % IV SOLN: INTRAVENOUS | Status: DC | PRN

## 2020-05-17 MED ORDER — PROPOFOL 10 MG/ML IV BOLUS
INTRAVENOUS | Status: DC | PRN
  Administered 2020-05-17: 100 mg via INTRAVENOUS

## 2020-05-17 MED ORDER — GLUCAGON HCL RDNA (DIAGNOSTIC) 1 MG IJ SOLR
INTRAMUSCULAR | Status: AC
  Filled 2020-05-17: qty 1

## 2020-05-17 MED ORDER — SODIUM CHLORIDE 0.9 % IV SOLN
8.0000 mg | Freq: Three times a day (TID) | INTRAVENOUS | Status: DC | PRN
  Filled 2020-05-17 (×2): qty 4

## 2020-05-17 NOTE — Progress Notes (Signed)
[facility] Progress Note     Subjective: CC-  Comfortable today. Denies abdominal pain. Ready for ERCP which is later today. INR 1.6  Objective: Vital signs in last 24 hours: Temp:  [97.6 F (36.4 C)-98.8 F (37.1 C)] 98.8 F (37.1 C) (12/17 0529) Pulse Rate:  [56-94] 94 (12/17 0529) Resp:  [16-18] 18 (12/17 0529) BP: (100-171)/(63-94) 171/94 (12/17 0529) SpO2:  [92 %-100 %] 100 % (12/17 0529) Weight:  [106.3 kg-118.7 kg] 106.3 kg (12/16 2133) Last BM Date: 05/13/20  Intake/Output from previous day: 12/16 0701 - 12/17 0700 In: 300.2 [P.O.:120; I.V.:180.2] Out: 1800  Intake/Output this shift: No intake/output data recorded.  PE:  Gen:  Alert, NAD, pleasant Heart: irregular Lungs: CTAB, no wheezes, rhonchi, or rales noted. Respiratory effort nonlabored Abd: soft,NT, ND, +BS, no masses, hernias, or organomegaly Psych: A&Ox3 with an appropriate affect. Skin: no rashes noted, warm and dry  Lab Results:  Recent Labs    05/16/20 1048 05/17/20 0224  WBC 4.6 4.4  HGB 11.2* 11.7*  HCT 34.8* 36.4*  PLT 139* 124*   BMET Recent Labs    05/16/20 1048 05/17/20 0224  NA 136 136  K 4.1 4.0  CL 94* 98  CO2 25 23  GLUCOSE 111* 86  BUN 37* 26*  CREATININE 9.14* 7.06*  CALCIUM 8.1* 8.1*   PT/INR Recent Labs    05/16/20 0323 05/17/20 0224  LABPROT 21.1* 18.6*  INR 1.9* 1.6*   CMP     Component Value Date/Time   NA 136 05/17/2020 0224   NA 142 09/04/2016 1628   K 4.0 05/17/2020 0224   CL 98 05/17/2020 0224   CO2 23 05/17/2020 0224   GLUCOSE 86 05/17/2020 0224   BUN 26 (H) 05/17/2020 0224   BUN 22 09/04/2016 1628   CREATININE 7.06 (H) 05/17/2020 0224   CALCIUM 8.1 (L) 05/17/2020 0224   CALCIUM 9.1 01/09/2013 0903   PROT 6.9 05/17/2020 0224   ALBUMIN 3.1 (L) 05/17/2020 0224   AST 26 05/17/2020 0224   ALT 32 05/17/2020 0224   ALKPHOS 139 (H) 05/17/2020 0224   BILITOT 1.3 (H) 05/17/2020 0224   GFRNONAA 8 (L) 05/17/2020 0224   GFRAA 16  (L) 07/16/2018 2320   Lipase     Component Value Date/Time   LIPASE 43 05/13/2020 1017       Studies/Results: No results found.  Anti-infectives: Anti-infectives (From admission, onward)   None       Assessment/Plan ESRD on HD T/Th/Sat Hypotension - more hypertension now, not requiring midodrine with HD, started on amlodipine 5mg  qd IDDM HLD PVD H/o DVT with filter on Coumadin Hx Non-Hodgkin's lymphoma 2009 s/p chemotherapy Non-ischemic cardiomyopathy with HF (improvd EF 50-55% on ECHO 04/2019) Atrial fibrillation H/o pericardial effusion s/p pericardial window 2019   Choledocholithiasis -CT from12/13confirmed choledocholithiasis. GI has seenthe patient and recommend ERCP once INR down. Continue holding coumadin, he isonheparin gtt. - Cardiology has seen and deemed "acceptable risk for surgery if necessary.He will need of course to hold warfarin for up to 5 days with transition to IV heparin per pharmacy. He denies any anginal symptoms or worsening shortness of breath. LVEF is near normal based on echo last year. No indications for further testing at this time."  Yesterday "Will need to restart anti-hypertensive medication and observe. Recommend starting amlodipine 5 mg daily, which can start after dialysis today. It is important to have better BP control prior to possible surgery." - Due to multiple medical problems and patient holding  coumadin for ERCP, beneficial to consider laparoscopic cholecystectomy this admission once CBD stones are taken care of.   ID - none FEN -NPO for procedure Foley - none VTE - heparin gtt on hold for procedure, INR 1.6 today Follow up - TBD  Plan: ERCP today. Repeat labs in AM. Tentatively plan for lap chole tomorrow. NPO after midnight. Will write to hold IV heparin 6 hours pre-procedure tomorrow.   LOS: 4 days    [facility] 05/17/2020, 9:34 AM Please see Amion for pager number  during day hours 7:00am-4:30pm

## 2020-05-17 NOTE — Progress Notes (Signed)
DAILY PROGRESS NOTE   Patient Name: [name] Date of Encounter: 05/17/2020 Cardiologist: Pixie Casino, MD  Chief Complaint   Mild abdominal pain  Patient Profile   69 yo male with remote history of NICM, LVEF 30-35% (improved to 50-55% in 2020), PAF/flutter, history of tamponade s/p pericardial window for hemorrhagic effusion, ESRD on HD, DM2, HTN, dyslipidemia and no significant CAD by cath in 2019, presents with abdominal pain and found to have possible biliary source. I was asked to evaluate for preoperative risk.  Subjective   No issues overnight - BP as low as around 161-096 systolic yesterday - had dialysis. Some oozing around his fistula. Plan for ERCP today - INR 1.6.  Objective   Vitals:   05/16/20 1335 05/16/20 1707 05/16/20 2133 05/17/20 0529  BP: (!) 149/85 109/68 (!) 153/78 (!) 171/94  Pulse: 90 68 73 94  Resp: 18 18 18 18   Temp: 97.6 F (36.4 C) 97.6 F (36.4 C) 98.2 F (36.8 C) 98.8 F (37.1 C)  TempSrc: Oral Oral Oral Oral  SpO2: 99% 92% 96% 100%  Weight: 116.4 kg  106.3 kg   Height:        Intake/Output Summary (Last 24 hours) at 05/17/2020 0915 Last data filed at 05/17/2020 0600 Gross per 24 hour  Intake 180.15 ml  Output 1800 ml  Net -1619.85 ml   Filed Weights   05/16/20 1030 05/16/20 1335 05/16/20 2133  Weight: 118.7 kg 116.4 kg 106.3 kg    Physical Exam   General appearance: alert and no distress Neck: no carotid bruit, no JVD and thyroid not enlarged, symmetric, no tenderness/mass/nodules Lungs: clear to auscultation bilaterally Heart: regular rate and rhythm, S1, S2 normal, no murmur, click, rub or gallop Abdomen: soft, non-tender; bowel sounds normal; no masses,  no organomegaly Extremities: extremities normal, atraumatic, no cyanosis or edema Pulses: 2+ and symmetric Skin: Skin color, texture, turgor normal. No rashes or lesions Neurologic: Grossly normal Psych: Pleasant  Inpatient Medications    Scheduled Meds: .  amLODipine  5 mg Oral Daily  . cinacalcet  30 mg Oral BID AC  . doxercalciferol  5 mcg Intravenous Q T,Th,Sa-HD  . insulin aspart  0-6 Units Subcutaneous TID WC  . [START ON 05/18/2020] iron polysaccharides  150 mg Oral Daily  . multivitamin  1 tablet Oral QHS  . sevelamer carbonate  800 mg Oral TID WC    Continuous Infusions:   PRN Meds: acetaminophen **OR** acetaminophen, calcium carbonate (dosed in mg elemental calcium), camphor-menthol **AND** hydrOXYzine, docusate sodium, feeding supplement (NEPRO CARB STEADY), fentaNYL (SUBLIMAZE) injection, hydrALAZINE, ondansetron **OR** ondansetron (ZOFRAN) IV, sorbitol, zolpidem   Labs   Results for orders placed or performed during the hospital encounter of 05/13/20 (from the past 48 hour(s))  Glucose, capillary     Status: Abnormal   Collection Time: 05/15/20  4:34 PM  Result Value Ref Range   Glucose-Capillary 140 (H) 70 - 99 mg/dL    Comment: Glucose reference range applies only to samples taken after fasting for at least 8 hours.  Glucose, capillary     Status: Abnormal   Collection Time: 05/15/20  8:47 PM  Result Value Ref Range   Glucose-Capillary 114 (H) 70 - 99 mg/dL    Comment: Glucose reference range applies only to samples taken after fasting for at least 8 hours.  Protime-INR     Status: Abnormal   Collection Time: 05/16/20  3:23 AM  Result Value Ref Range   Prothrombin Time 21.1 (  H) 11.4 - 15.2 seconds   INR 1.9 (H) 0.8 - 1.2    Comment: (NOTE) INR goal varies based on device and disease states. Performed at [facility], [place] [address], [place], [place] [address]   CBC     Status: Abnormal   Collection Time: 05/16/20  3:23 AM  Result Value Ref Range   WBC 4.3 4.0 - 10.5 K/uL   RBC 3.84 (L) 4.22 - 5.81 MIL/uL   Hemoglobin 11.8 (L) 13.0 - 17.0 g/dL   HCT 35.6 (L) 39.0 - 52.0 %   MCV 92.7 80.0 - 100.0 fL   MCH 30.7 26.0 - 34.0 pg   MCHC 33.1 30.0 - 36.0 g/dL   RDW 15.9 (H) 11.5 - 15.5 %   Platelets 128 (L)  150 - 400 K/uL    Comment: REPEATED TO VERIFY   nRBC 0.0 0.0 - 0.2 %    Comment: Performed at [facility], [place] [address], [place], [place] [address]  Glucose, capillary     Status: None   Collection Time: 05/16/20  6:41 AM  Result Value Ref Range   Glucose-Capillary 80 70 - 99 mg/dL    Comment: Glucose reference range applies only to samples taken after fasting for at least 8 hours.  Renal function panel     Status: Abnormal   Collection Time: 05/16/20 10:48 AM  Result Value Ref Range   Sodium 136 135 - 145 mmol/L   Potassium 4.1 3.5 - 5.1 mmol/L   Chloride 94 (L) 98 - 111 mmol/L   CO2 25 22 - 32 mmol/L   Glucose, Bld 111 (H) 70 - 99 mg/dL    Comment: Glucose reference range applies only to samples taken after fasting for at least 8 hours.   BUN 37 (H) 8 - 23 mg/dL   Creatinine, Ser 9.14 (H) 0.61 - 1.24 mg/dL   Calcium 8.1 (L) 8.9 - 10.3 mg/dL   Phosphorus 4.8 (H) 2.5 - 4.6 mg/dL   Albumin 3.1 (L) 3.5 - 5.0 g/dL   GFR, Estimated 6 (L) >60 mL/min    Comment: (NOTE) Calculated using the CKD-EPI Creatinine Equation (2021)    Anion gap 17 (H) 5 - 15    Comment: Performed at [facility] [address], [place], [place] [address]  CBC     Status: Abnormal   Collection Time: 05/16/20 10:48 AM  Result Value Ref Range   WBC 4.6 4.0 - 10.5 K/uL   RBC 3.68 (L) 4.22 - 5.81 MIL/uL   Hemoglobin 11.2 (L) 13.0 - 17.0 g/dL   HCT 34.8 (L) 39.0 - 52.0 %   MCV 94.6 80.0 - 100.0 fL   MCH 30.4 26.0 - 34.0 pg   MCHC 32.2 30.0 - 36.0 g/dL   RDW 15.8 (H) 11.5 - 15.5 %   Platelets 139 (L) 150 - 400 K/uL   nRBC 0.0 0.0 - 0.2 %    Comment: Performed at [facility], [place] [address], [place], [place] [address]  Glucose, capillary     Status: Abnormal   Collection Time: 05/16/20  5:07 PM  Result Value Ref Range   Glucose-Capillary 131 (H) 70 - 99 mg/dL    Comment: Glucose reference range applies only to samples taken after fasting for at least 8 hours.  Heparin level  (unfractionated)     Status: None   Collection Time: 05/16/20  6:10 PM  Result Value Ref Range   Heparin Unfractionated 0.41 0.30 -  0.70 IU/mL    Comment: (NOTE) If heparin results are below expected values, and patient dosage has  been confirmed, suggest follow up testing of antithrombin III levels. Performed at [facility], [place] [address], [place], [place] [address]   Glucose, capillary     Status: Abnormal   Collection Time: 05/16/20  9:35 PM  Result Value Ref Range   Glucose-Capillary 108 (H) 70 - 99 mg/dL    Comment: Glucose reference range applies only to samples taken after fasting for at least 8 hours.  Protime-INR     Status: Abnormal   Collection Time: 05/17/20  2:24 AM  Result Value Ref Range   Prothrombin Time 18.6 (H) 11.4 - 15.2 seconds   INR 1.6 (H) 0.8 - 1.2    Comment: (NOTE) INR goal varies based on device and disease states. Performed at [facility], [place] [address], [place], [place] [address]   CBC     Status: Abnormal   Collection Time: 05/17/20  2:24 AM  Result Value Ref Range   WBC 4.4 4.0 - 10.5 K/uL   RBC 3.91 (L) 4.22 - 5.81 MIL/uL   Hemoglobin 11.7 (L) 13.0 - 17.0 g/dL   HCT 36.4 (L) 39.0 - 52.0 %   MCV 93.1 80.0 - 100.0 fL   MCH 29.9 26.0 - 34.0 pg   MCHC 32.1 30.0 - 36.0 g/dL   RDW 15.6 (H) 11.5 - 15.5 %   Platelets 124 (L) 150 - 400 K/uL    Comment: REPEATED TO VERIFY   nRBC 0.0 0.0 - 0.2 %    Comment: Performed at [facility], [place] [address] [place], [place] [address]  Heparin level (unfractionated)     Status: None   Collection Time: 05/17/20  2:24 AM  Result Value Ref Range   Heparin Unfractionated 0.58 0.30 - 0.70 IU/mL    Comment: (NOTE) If heparin results are below expected values, and patient dosage has  been confirmed, suggest follow up testing of antithrombin III levels. Performed at [facility], [place] [address], [place],  [address]   Comprehensive metabolic panel Once     Status: Abnormal    Collection Time: 05/17/20  2:24 AM  Result Value Ref Range   Sodium 136 135 - 145 mmol/L   Potassium 4.0 3.5 - 5.1 mmol/L   Chloride 98 98 - 111 mmol/L   CO2 23 22 - 32 mmol/L   Glucose, Bld 86 70 - 99 mg/dL    Comment: Glucose reference range applies only to samples taken after fasting for at least 8 hours.   BUN 26 (H) 8 - 23 mg/dL   Creatinine, Ser 7.06 (H) 0.61 - 1.24 mg/dL   Calcium 8.1 (L) 8.9 - 10.3 mg/dL   Total Protein 6.9 6.5 - 8.1 g/dL   Albumin 3.1 (L) 3.5 - 5.0 g/dL   AST 26 15 - 41 U/L   ALT 32 0 - 44 U/L   Alkaline Phosphatase 139 (H) 38 - 126 U/L   Total Bilirubin 1.3 (H) 0.3 - 1.2 mg/dL   GFR, Estimated 8 (L) >60 mL/min    Comment: (NOTE) Calculated using the CKD-EPI Creatinine Equation (2021)    Anion gap 15 5 - 15    Comment: Performed at [facility], [place] [address], [place], [place] [address]  Glucose, capillary     Status: None   Collection Time: 05/17/20  6:35 AM  Result Value Ref Range  Glucose-Capillary 71 70 - 99 mg/dL    Comment: Glucose reference range applies only to samples taken after fasting for at least 8 hours.    ECG   N/A  Telemetry   N/A  Radiology    No results found.  Cardiac Studies   N/A  Assessment   Principal Problem:   Cholelithiasis without obstruction Active Problems:   Hyperlipidemia   ESRD (end stage renal disease) (HCC)   Systolic heart failure (HCC)   Hypotension   Class 1 obesity due to excess calories with body mass index (BMI) of 33.0 to 33.9 in adult   Choledocholithiasis  Plan   Mr. Siedschlag has better BP control on amlodipine. Would continue that - does not appear to have issues with too low BP's. Plan for ERCP likely today as INR is 1.6.  Time Spent Directly with Patient:  I have spent a total of 25 minutes with the patient reviewing hospital notes, telemetry, EKGs, labs and examining the patient as well as establishing an assessment and plan that was discussed personally with the patient.  >  50% of time was spent in direct patient care.  Length of Stay:  LOS: 4 days   Pixie Casino, MD, [facility], [place] Director of the Advanced Lipid Disorders &  Cardiovascular Risk Reduction Clinic Diplomate of the American Board of Clinical Lipidology Attending Cardiologist  Direct Dial: [phone]  Fax: [phone]  Website:  [email or web address] 05/17/2020, 9:15 AM

## 2020-05-17 NOTE — TOC Progression Note (Signed)
Transition of Care [facility]) - Progression Note    Patient Details  Name: [name] MRN: [id number] Date of Birth: 07/24/1950  Transition of Care [facility]) CM/SW Contact  Bartholomew Crews, RN Phone Number: [phone] 05/17/2020, 2:38 PM  Clinical Narrative:     Spoke with patient at the bedside about home health agency choice. Patient unable to identify name of previous home health agency. After chart review agency may have been [facility]. Patient agreeable to this. Referral accepted by [facility]. HH orders written. TOC following for transition needs.   Expected Discharge Plan: [place] Barriers to Discharge: Continued Medical Work up  Expected Discharge Plan and Services Expected Discharge Plan: [place] In-house Referral: [facility] Discharge Planning Services: CM Consult Post Acute Care Choice: [place] arrangements for the past 2 months: Apartment                           HH Arranged: PT [place]: [place] Date [place]: 05/17/20 Time [place]: 1437 Representative spoke with at [place]: [place] Determinants of Health ([place]) Interventions    Readmission Risk Interventions No flowsheet data found.

## 2020-05-17 NOTE — Progress Notes (Signed)
PROGRESS NOTE    [name]  PJA:[id number] DOB: [birth year] DOA: 05/13/2020 PCP: Burnard Bunting, MD   Brief Narrative: 69 year old with past medical history significant for peripheral vascular disease a status post right transmetatarsal amputation, and non Hodgkin lymphoma, type 2 diabetes mellitus, hypertension, hyperlipidemia, chronic systolic congestive heart failure, A. fib on Coumadin, history of pericardial tamponade status post window, OSA not on CPAP, ESRD on hemodialysis TTS who presented from general surgery office with persistent abdominal pain over the past 2 or 3 weeks.  Patient report pain as intermittent, not worse after eating.  Patient denies any fever chills night sweats, nausea vomiting.  Evaluation in the ED vitals are stable, potassium 4.3, chloride 96, CO2 28, glucose 130, BUN 52, creatinine 9.6, glucose 130, lipase 43.  Bilirubin 0.8.  White blood cell 5.8, hemoglobin 11.6, platelets 154.  COVID-19 influenza negative.  Right upper quadrant ultrasound with large solitary gallstone without evidence of cholecystitis, CBD 10 mm consistent with mild dilation, increase echogenicity hepatic parenchyma, possible right nephrolithiasis.  GI, general surgery were consulted.  Hospitalist service consulted for admission and further evaluation and management.   Assessment & Plan:   Principal Problem:   Cholelithiasis without obstruction Active Problems:   Hyperlipidemia   ESRD (end stage renal disease) (HCC)   Systolic heart failure (HCC)   Hypotension   Class 1 obesity due to excess calories with body mass index (BMI) of 33.0 to 33.9 in adult   Choledocholithiasis  1-Choledocholithiasis: -Patient presented to the ED from general surgery office with persistent abdominal pain over the last 2 or 3 weeks. Patient was found to have CBD ductal dilation with a gallstone within the gallbladder on  right upper quadrant ultrasound. -CT renal was done.if with choledocholithiasis with  a small stone distal common bile duct measuring up to 8 mm with biliary ductal dilation, mild gallbladder distention and intraluminal gallstone with pericholecystic inflammation -[place], GI and general surgery following. -Plan for ERCP today. INT down to 1.6 -he will need cholecystectomy during this admission.   2-Chronic Systolic Congestive Heart Failure, nonischemic cardiomyopathy, essential hypertension History of left ventricular ejection fraction 30 to 35% with improvement to 50 to 55% in 2020 Patient was evaluated by cardiology on 05/13/2020 for preoperative evaluation.  No indication for further testing at this time. Currently holding Coumadin, plan to bridge with heparin. Not on ACE or ARB or beta-blocker at home due to history of hypotension.  3-Hypotension;  He was on midodrine, currently on hold due to elevated BP.    4-Paroxysmal A. fib/flutter: On anticoagulation Holding Coumadin pending ERCP. Heparin drip to be managed by pharmacy  5-ESRD on hemodialysis: Continue with dialysis TTS. Appreciate nephrology follow-up  History of non-Hodgkin lymphoma status post chemo in remission, history of sarcomatoid tumor scalp: Follow-up with oncology as an outpatient.  HTN; started on BP medications this admission. Monitor.  BP improved on Norvasc.    Estimated body mass index is 28.9 kg/m as calculated from the following:   Height as of this encounter: 6' 3.5" (1.918 m).   Weight as of this encounter: 106.3 kg.   DVT prophylaxis: scd Code Status: Full Code Family Communication: care discussed with  Disposition Plan:  Status is: Inpatient  Remains inpatient appropriate because:Ongoing active pain requiring inpatient pain management and IV treatments appropriate due to intensity of illness or inability to take PO   Dispo:  Patient From: Home  Planned Disposition: Home  Expected discharge date: 05/18/2020  Medically stable for discharge: No, plan  for ERCP on  12/17         Consultants:   GI  Nephrology  Procedures:   None  Antimicrobials:    Subjective: He denies dyspnea, denies abdominal pain. Awaiting ERCP today.   Objective: Vitals:   05/16/20 1335 05/16/20 1707 05/16/20 2133 05/17/20 0529  BP: (!) 149/85 109/68 (!) 153/78 (!) 171/94  Pulse: 90 68 73 94  Resp: 18 18 18 18   Temp: 97.6 F (36.4 C) 97.6 F (36.4 C) 98.2 F (36.8 C) 98.8 F (37.1 C)  TempSrc: Oral Oral Oral Oral  SpO2: 99% 92% 96% 100%  Weight: 116.4 kg  106.3 kg   Height:        Intake/Output Summary (Last 24 hours) at 05/17/2020 0950 Last data filed at 05/17/2020 0600 Gross per 24 hour  Intake 180.15 ml  Output 1800 ml  Net -1619.85 ml   Filed Weights   05/16/20 1030 05/16/20 1335 05/16/20 2133  Weight: 118.7 kg 116.4 kg 106.3 kg    Examination:  General exam: NAD Respiratory system: CTA Cardiovascular system: S 1, S 2 RRR Gastrointestinal system: BS present, soft, nt Central nervous system: Alert Extremities: trace edema   Data Reviewed: I have personally reviewed following labs and imaging studies  CBC: Recent Labs  Lab 05/14/20 0429 05/15/20 0151 05/16/20 0323 05/16/20 1048 05/17/20 0224  WBC 5.8 4.9 4.3 4.6 4.4  HGB 12.5* 11.2* 11.8* 11.2* 11.7*  HCT 39.3 34.3* 35.6* 34.8* 36.4*  MCV 95.4 93.0 92.7 94.6 93.1  PLT 135* 140* 128* 139* 812*   Basic Metabolic Panel: Recent Labs  Lab 05/13/20 1017 05/14/20 0429 05/15/20 0151 05/16/20 1048 05/17/20 0224  NA 142 141 140 136 136  K 4.3 4.7 4.6 4.1 4.0  CL 96* 99 98 94* 98  CO2 28 24 23 25 23   GLUCOSE 130* 73 133* 111* 86  BUN 52* 60* 70* 37* 26*  CREATININE 9.65* 11.08* 12.66* 9.14* 7.06*  CALCIUM 8.4* 8.3* 7.9* 8.1* 8.1*  PHOS  --   --   --  4.8*  --    GFR: Estimated Creatinine Clearance: 13.5 mL/min (A) (by C-G formula based on SCr of 7.06 mg/dL (H)). Liver Function Tests: Recent Labs  Lab 05/13/20 1017 05/14/20 0429 05/15/20 0151 05/16/20 1048  05/17/20 0224  AST 24 28 26   --  26  ALT 27 26 30   --  32  ALKPHOS 121 134* 141*  --  139*  BILITOT 0.8 1.3* 1.2  --  1.3*  PROT 7.2 7.0 6.4*  --  6.9  ALBUMIN 3.4* 3.3* 3.0* 3.1* 3.1*   Recent Labs  Lab 05/13/20 1017  LIPASE 43   No results for input(s): AMMONIA in the last 168 hours. Coagulation Profile: Recent Labs  Lab 05/13/20 1739 05/14/20 0429 05/15/20 0151 05/16/20 0323 05/17/20 0224  INR 2.4* 2.3* 2.3* 1.9* 1.6*   Cardiac Enzymes: No results for input(s): CKTOTAL, CKMB, CKMBINDEX, TROPONINI in the last 168 hours. BNP (last 3 results) No results for input(s): PROBNP in the last 8760 hours. HbA1C: No results for input(s): HGBA1C in the last 72 hours. CBG: Recent Labs  Lab 05/15/20 2047 05/16/20 0641 05/16/20 1707 05/16/20 2135 05/17/20 0635  GLUCAP 114* 80 131* 108* 71   Lipid Profile: No results for input(s): CHOL, HDL, LDLCALC, TRIG, CHOLHDL, LDLDIRECT in the last 72 hours. Thyroid Function Tests: No results for input(s): TSH, T4TOTAL, FREET4, T3FREE, THYROIDAB in the last 72 hours. Anemia Panel: No results for input(s): VITAMINB12, FOLATE, FERRITIN,  TIBC, IRON, RETICCTPCT in the last 72 hours. Sepsis Labs: No results for input(s): PROCALCITON, LATICACIDVEN in the last 168 hours.  Recent Results (from the past 240 hour(s))  Resp Panel by RT-PCR (Flu A&B, Covid) Nasopharyngeal Swab     Status: None   Collection Time: 05/13/20  4:07 PM   Specimen: Nasopharyngeal Swab; Nasopharyngeal(NP) swabs in vial transport medium  Result Value Ref Range Status   SARS Coronavirus 2 by RT PCR NEGATIVE NEGATIVE Final    Comment: (NOTE) SARS-CoV-2 target nucleic acids are NOT DETECTED.  The SARS-CoV-2 RNA is generally detectable in upper respiratory specimens during the acute phase of infection. The lowest concentration of SARS-CoV-2 viral copies this assay can detect is 138 copies/mL. A negative result does not preclude SARS-Cov-2 infection and should not be used  as the sole basis for treatment or other patient management decisions. A negative result may occur with  improper specimen collection/handling, submission of specimen other than nasopharyngeal swab, presence of viral mutation(s) within the areas targeted by this assay, and inadequate number of viral copies(<138 copies/mL). A negative result must be combined with clinical observations, patient history, and epidemiological information. The expected result is Negative.  Fact Sheet for Patients:  EntrepreneurPulse.com.au  Fact Sheet for Healthcare Providers:  IncredibleEmployment.be  This test is no t yet approved or cleared by the [place] FDA and  has been authorized for detection and/or diagnosis of SARS-CoV-2 by FDA under an Emergency Use Authorization (EUA). This EUA will remain  in effect (meaning this test can be used) for the duration of the COVID-19 declaration under Section 564(b)(1) of the Act, 21 U.S.C.section 360bbb-3(b)(1), unless the authorization is terminated  or revoked sooner.       Influenza A by PCR NEGATIVE NEGATIVE Final   Influenza B by PCR NEGATIVE NEGATIVE Final    Comment: (NOTE) The Xpert Xpress SARS-CoV-2/FLU/RSV plus assay is intended as an aid in the diagnosis of influenza from Nasopharyngeal swab specimens and should not be used as a sole basis for treatment. Nasal washings and aspirates are unacceptable for Xpert Xpress SARS-CoV-2/FLU/RSV testing.  Fact Sheet for Patients: EntrepreneurPulse.com.au  Fact Sheet for Healthcare Providers: IncredibleEmployment.be  This test is not yet approved or cleared by the [place] FDA and has been authorized for detection and/or diagnosis of SARS-CoV-2 by FDA under an Emergency Use Authorization (EUA). This EUA will remain in effect (meaning this test can be used) for the duration of the COVID-19 declaration under Section 564(b)(1)  of the Act, 21 U.S.C. section 360bbb-3(b)(1), unless the authorization is terminated or revoked.  Performed at [facility], [place] [address] [place], [place] [address]   MRSA PCR Screening     Status: None   Collection Time: 05/14/20  3:18 PM   Specimen: Nasopharyngeal  Result Value Ref Range Status   MRSA by PCR NEGATIVE NEGATIVE Final    Comment:        The GeneXpert MRSA Assay (FDA approved for NASAL specimens only), is one component of a comprehensive MRSA colonization surveillance program. It is not intended to diagnose MRSA infection nor to guide or monitor treatment for MRSA infections. Performed at [facility], [place] [address] [place], [place] [address]          Radiology Studies: No results found.      Scheduled Meds: . amLODipine  5 mg Oral Daily  . cinacalcet  30 mg Oral BID AC  . doxercalciferol  5 mcg Intravenous Q T,Th,Sa-HD  .  insulin aspart  0-6 Units Subcutaneous TID WC  . [START ON 05/18/2020] iron polysaccharides  150 mg Oral Daily  . multivitamin  1 tablet Oral QHS  . sevelamer carbonate  800 mg Oral TID WC   Continuous Infusions:    LOS: 4 days    Time spent: 35 minutes.     Elmarie Shiley, MD [facility]   If 7PM-7AM, please contact night-coverage [email or web address]  05/17/2020, 9:50 AM

## 2020-05-17 NOTE — Progress Notes (Signed)
[facility] Progress Note   Subjective:   Patient seen and examined at bedside.  Plan for ERCP today and cholecystectomy tomorrow.  Denies abdominal pain, n/v/d, CP, SOB, weakness, dizziness and fatigue.  Has had some "leaking" from RU AVF this morning.  Reports he has not been having bleeding post HD as outpatient.    Objective Vitals:   05/16/20 1335 05/16/20 1707 05/16/20 2133 05/17/20 0529  BP: (!) 149/85 109/68 (!) 153/78 (!) 171/94  Pulse: 90 68 73 94  Resp: 18 18 18 18   Temp: 97.6 F (36.4 C) 97.6 F (36.4 C) 98.2 F (36.8 C) 98.8 F (37.1 C)  TempSrc: Oral Oral Oral Oral  SpO2: 99% 92% 96% 100%  Weight: 116.4 kg  106.3 kg   Height:       Physical Exam General:WDWN male in NAD Heart:RRR, no mrg Lungs:CTAB, nml WOB on RA Abdomen:soft, NTND Extremities:no LE edema, R TMA Dialysis Access: RU AVF +b - bandage with small amount of blood   Filed Weights   05/16/20 1030 05/16/20 1335 05/16/20 2133  Weight: 118.7 kg 116.4 kg 106.3 kg    Intake/Output Summary (Last 24 hours) at 05/17/2020 1004 Last data filed at 05/17/2020 0600 Gross per 24 hour  Intake 180.15 ml  Output 1800 ml  Net -1619.85 ml    Additional Objective Labs: Basic Metabolic Panel: Recent Labs  Lab 05/15/20 0151 05/16/20 1048 05/17/20 0224  NA 140 136 136  K 4.6 4.1 4.0  CL 98 94* 98  CO2 23 25 23   GLUCOSE 133* 111* 86  BUN 70* 37* 26*  CREATININE 12.66* 9.14* 7.06*  CALCIUM 7.9* 8.1* 8.1*  PHOS  --  4.8*  --    Liver Function Tests: Recent Labs  Lab 05/14/20 0429 05/15/20 0151 05/16/20 1048 05/17/20 0224  AST 28 26  --  26  ALT 26 30  --  32  ALKPHOS 134* 141*  --  139*  BILITOT 1.3* 1.2  --  1.3*  PROT 7.0 6.4*  --  6.9  ALBUMIN 3.3* 3.0* 3.1* 3.1*   Recent Labs  Lab 05/13/20 1017  LIPASE 43   CBC: Recent Labs  Lab 05/14/20 0429 05/15/20 0151 05/16/20 0323 05/16/20 1048 05/17/20 0224  WBC 5.8 4.9 4.3 4.6 4.4  HGB 12.5* 11.2* 11.8* 11.2* 11.7*   HCT 39.3 34.3* 35.6* 34.8* 36.4*  MCV 95.4 93.0 92.7 94.6 93.1  PLT 135* 140* 128* 139* 124*   CBG: Recent Labs  Lab 05/15/20 2047 05/16/20 0641 05/16/20 1707 05/16/20 2135 05/17/20 0635  GLUCAP 114* 80 131* 108* 71   Studies/Results: No results found.  Medications:  . amLODipine  5 mg Oral Daily  . cinacalcet  30 mg Oral BID AC  . doxercalciferol  5 mcg Intravenous Q T,Th,Sa-HD  . insulin aspart  0-6 Units Subcutaneous TID WC  . [START ON 05/18/2020] iron polysaccharides  150 mg Oral Daily  . multivitamin  1 tablet Oral QHS  . sevelamer carbonate  800 mg Oral TID WC    Dialysis Orders: [place] on TTS 4 hours 15 minutes EDW 119.5 kg,2K, 2.25 calcium bath  RUA AVF No hep Hectorol 9 mics last PTH 252, 245(decrease to 5 mics this hospitalization\  Assessment/Plan: 1. ESRD -HD TTS, hemo today on schedule K4.0. Bleeding from AVF this AM, if reoccurs consider fistulogram. Will plan for HD tomorrow around surgery schedule - likely post surgery. 2. Choledocholithiasis - Plan for ERCP today and cholecystectomy tomorrow.  3. Hypertension/volume BP elevated,  BP meds not yet given.  BP has been better with Amlodipine 5 mg q day . As anoutpatient midodrine listed,not started in hospital. UF as tolerated.  4. Anemia -Hgb 911.7 No indication for ESA.  5. Metabolic bone disease -OP large dose Hectorol 9 mcg qHD decreased to 5 mcg during admission. calcium corrected 8.8 , last phosphorus 4.8.  Continue renvela and sensipar.  6. DM type II-plan for admit 7. Nutrition -renal carb modified/renal vitamin on Nepro with albumin 3.1   Jen Mow, PA-C [facility] 05/17/2020,10:04 AM  LOS: 4 days

## 2020-05-17 NOTE — Progress Notes (Signed)
[place] for Heparin Indication: atrial fibrillation  No Active Allergies  Patient Measurements: Height: 6' 3.5" (191.8 cm) Weight: 106.3 kg (234 lb 5.6 oz) IBW/kg (Calculated) : 85.65 Heparin Dosing Weight: 111.7 kg  Vital Signs: Temp: 97 F (36.1 C) (12/17 1525) Temp Source: Temporal (12/17 1525) BP: 118/60 (12/17 1530) Pulse Rate: 58 (12/17 1530)  Labs: Recent Labs    05/15/20 0151 05/16/20 0323 05/16/20 1048 05/16/20 1810 05/17/20 0224  HGB 11.2* 11.8* 11.2*  --  11.7*  HCT 34.3* 35.6* 34.8*  --  36.4*  PLT 140* 128* 139*  --  124*  LABPROT 24.9* 21.1*  --   --  18.6*  INR 2.3* 1.9*  --   --  1.6*  HEPARINUNFRC  --   --   --  0.41 0.58  CREATININE 12.66*  --  9.14*  --  7.06*    Estimated Creatinine Clearance: 13.5 mL/min (A) (by C-G formula based on SCr of 7.06 mg/dL (H)).   Medical History: Past Medical History:  Diagnosis Date  . Anemia   . Arthritis    HNP- lumbar, "all over my body"  . Blood transfusion    "years ago; blood was low" (08/05/2013)  . Diabetic nephropathy ([place])   . Diabetic retinopathy   . DVT (deep venous thrombosis) ([place])    "got one in my right leg now; I've had one before too, not sure which leg" (08/05/2013)  . ESRD (end stage renal disease) on dialysis ([place])    TTS, [place]  . Family history of anesthesia complication    " my son wakes up slowly"  . GERD (gastroesophageal reflux disease)    uses alka seltzere on occas.   [name])    "one q now and then" (08/05/2013)  . Hyperlipidemia   . Hypertension   . 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 Non Hodgkin's lymphoma ([place])    Tx 2009; "had chemo; it went away" (08/05/2013)  . Noncompliance 03/16/2012  . NSVT (nonsustained ventricular tachycardia) ([place]) 03/18/2012  . Peripheral vascular disease ([place])   . Poor historian    pt. unsure of several answers to  health history questions   . Skin cancer    melanoma - head  . Sleep apnea    "suppose to have a sleep study, but they never told me when. (08/05/2013)    Medications:  Scheduled:  . [MAR Hold] amLODipine  5 mg Oral Daily  . [MAR Hold] Chlorhexidine Gluconate Cloth  6 each Topical Q0600  . [MAR Hold] cinacalcet  30 mg Oral BID AC  . [MAR Hold] doxercalciferol  5 mcg Intravenous Q T,Th,Sa-HD  . [MAR Hold] insulin aspart  0-6 Units Subcutaneous TID WC  . [MAR Hold] iron polysaccharides  150 mg Oral Daily  . [MAR Hold] multivitamin  1 tablet Oral QHS  . pantoprazole  40 mg Oral Daily  . [MAR Hold] sevelamer carbonate  800 mg Oral TID WC    Assessment: Patient is a 69 yo male that is being admitted for Cholelithiasis, CBD dilatation. The patient is on warfarin at home for afib. At this time the patient's INR continues to be therapeutic at 2.3. Pharmacy has been asked to dose heparin in this patient for afib while holding warfarin.   INR is now <2 so started heparin bridging today. Plan for ERCP when INR drifts down a bit further.  Pt is s/p ERCP with stone extracted. Plan for cholecystectomy tomorrow. D/w team and we will hold resuming IV heparin until after his cholecystectomy tomorrow. It can then be decided by [facility].    Goal of Therapy:  Heparin level 0.3-0.7 units/ml Monitor platelets by anticoagulation protocol: Yes   Plan:  Hold heparin F/u resume in AM  Onnie Boer, PharmD, [place], AAHIVP, CPP Infectious Disease Pharmacist 05/17/2020 3:45 PM

## 2020-05-17 NOTE — Transfer of Care (Signed)
Immediate Anesthesia Transfer of Care Note  Patient: [name]  Procedure(s) Performed: ENDOSCOPIC RETROGRADE CHOLANGIOPANCREATOGRAPHY (ERCP) WITH PROPOFOL (N/A ) BIOPSY SPHINCTEROTOMY REMOVAL OF STONES  Patient Location: Endoscopy Unit  Anesthesia Type:General  Level of Consciousness: awake, alert  and oriented  Airway & Oxygen Therapy: Patient Spontanous Breathing  Post-op Assessment: Report given to RN and Post -op Vital signs reviewed and stable  Post vital signs: Reviewed and stable  Last Vitals:  Vitals Value Taken Time  BP 159/74 05/17/20 1525  Temp 36.1 C 05/17/20 1525  Pulse 58 05/17/20 1526  Resp 21 05/17/20 1526  SpO2 98 % 05/17/20 1526  Vitals shown include unvalidated device data.  Last Pain:  Vitals:   05/17/20 1525  TempSrc: Temporal  PainSc: 0-No pain      Patients Stated Pain Goal: 0 (60/15/61 5379)  Complications: No complications documented.

## 2020-05-17 NOTE — Care Management Important Message (Signed)
Important Message  Patient Details  Name: [name] MRN: [id number] Date of Birth: Sep 20, 1950   Medicare Important Message Given:  Yes     Barb Merino Kelleigh Skerritt 05/17/2020, 2:53 PM

## 2020-05-17 NOTE — Interval H&P Note (Signed)
History and Physical Interval Note:  05/17/2020 12:36 PM  [name]  has presented today for surgery, with the diagnosis of Choledocholithiasis.  The various methods of treatment have been discussed with the patient and family. After consideration of risks, benefits and other options for treatment, the patient has consented to  Procedure(s): ENDOSCOPIC RETROGRADE CHOLANGIOPANCREATOGRAPHY (ERCP) WITH PROPOFOL (N/A) as a surgical intervention.  The patient's history has been reviewed, patient examined, no change in status, stable for surgery.  I have reviewed the patient's chart and labs.  Questions were answered to the patient's satisfaction.    The risks of an ERCP were discussed at length, including but not limited to the risk of perforation, bleeding, abdominal pain, post-ERCP pancreatitis (while usually mild can be severe and even life threatening).   [employer]

## 2020-05-17 NOTE — Op Note (Addendum)
[facility] Patient Name: [name] Procedure Date : 05/17/2020 MRN: [id number] Attending MD: Justice Britain , MD Date of Birth: 09/16/1950 CSN: [id number] Age: 69 Admit Type: Inpatient Procedure:                ERCP Indications:              Bile duct stone(s), Abnormal abdominal CT, Biliary                            dilation on Computed Tomogram Scan, Bile duct stone                            on Computed Tomogram Scan, Evaluation and possible                            treatment of bile duct stone(s), Abnormal liver                            function test Providers:                Justice Britain, MD, Doristine Johns, RN, Glori Bickers, RN, Ladona Ridgel, Technician, Viann Fish, CRNA Referring MD:             Mauri Pole, MD, [facility], CCS                            Surgery Medicines:                General Anesthesia, Cipro 400 mg IV, Indomethacin                            100 mg PR, Glucagon 6.12 mg IV Complications:            No immediate complications. Estimated Blood Loss:     Estimated blood loss was minimal. Procedure:                Pre-Anesthesia Assessment:                           - Prior to the procedure, a History and Physical                            was performed, and patient medications and                            allergies were reviewed. The patient's tolerance of                            previous anesthesia was also reviewed. The risks  and benefits of the procedure and the sedation                            options and risks were discussed with the patient.                            All questions were answered, and informed consent                            was obtained. Prior Anticoagulants: The patient has                            taken Coumadin (warfarin), last dose was 4 days                            prior to  procedure with heparin off earlier today.                            ASA Grade Assessment: III - A patient with severe                            systemic disease. After reviewing the risks and                            benefits, the patient was deemed in satisfactory                            condition to undergo the procedure.                           After obtaining informed consent, the scope was                            passed under direct vision. Throughout the                            procedure, the patient's blood pressure, pulse, and                            oxygen saturations were monitored continuously. The                            TJF-Q180V ([id number] Olympus duodenoscope was                            introduced through the mouth, and used to inject                            contrast into and used to inject contrast into the                            bile duct. The ERCP was accomplished without  difficulty. The patient tolerated the procedure. Scope In: Scope Out: Findings:      The scout film was normal.      The upper GI tract was traversed under direct vision without detailed       examination. Patchy moderate inflammation characterized by erosions,       erythema and friability was found in the gastric body, at the incisura       and in the gastric antrum - biopsied for HP evaluation. No gross lesions       were noted in the duodenal bulb, in the first portion of the duodenum       and in the second portion of the duodenum. The major papilla was       prominent, bulging, congested, edematous, enlarged with what appeared to       be displacement of the pancreatic ampullary orifice vs a small fistula       from the biliary tree outwards as there was a small amount of presumed       bile that was flowing.      In a very extreme short position, a short 0.035 inch Soft Jagwire was       passed into the biliary tree. The Autotome  sphincterotome was passed       over the guidewire and the bile duct was then deeply cannulated.       Contrast was injected. I personally interpreted the bile duct images.       Ductal flow of contrast was adequate. Image quality was adequate.       Contrast extended to the hepatic ducts. Opacification of the entire       biliary tree except for the gallbladder was successful. The main bile       duct was severely dilated. The largest diameter was 18 mm in the distal       duct and tapered to around 12 mm in the main hepatic ducts. The main       bile duct contained filling defects thought to be stones and sludge. A       15 mm biliary sphincterotomy was made with a monofilament Autotome       sphincterotome using ERBE electrocautery. There was self limited oozing       from the sphincterotomy for which the extraction balloon was used to       place tension and help with tamponade effect, and other than this it did       not require further treatments. To discover objects, the biliary tree       was swept with a retrieval balloon starting at the bifurcation. Sludge       was swept from the duct. Multiple stones (>5) were removed. No stones       remained. An occlusion cholangiogram was performed that showed no       further significant biliary pathology.      A pancreatogram was not performed.      The duodenoscope was withdrawn from the patient. Impression:               - Gastritis - biopsied.                           - No gross lesions in the duodenal bulb, in the  first portion of the duodenum and in the second                            portion of the duodenum.                           - The major papilla appeared to be prominent,                            bulging, congested, edematous, enlarged. There                            appeared to be a displacement of the pancreatic                            duct laterally vs a small fistula on the upper                             portion of the ampulla draining some small amount                            of bile as well.                           - Filling defects consistent with stones and sludge                            were seen on the cholangiogram.                           - The entire main bile duct was severely dilated.                           - Choledocholithiasis was found. Complete removal                            was accomplished by biliary sphincterotomy and                            balloon trawl.                           - Short-lived sphincterotomy oozing was noted. Recommendation:           - The patient will be observed post-procedure,                            until all discharge criteria are met.                           - Return patient to hospital ward for ongoing care.                           - Clear liquid diet.                           -  Check liver enzymes (AST, ALT, alkaline                            phosphatase, bilirubin) in the morning.                           - Observe patient's clinical course.                           - Watch for pancreatitis, bleeding, perforation,                            and cholangitis.                           - Timing of cholecystectomy as per surgical service.                           - Would hold chemical VTE prophylaxis for 48 hours                            to decrease risk of post-interventional bleeding.                            If anticoagulation is necessary, consider heparin                            drip without bolus in 6 hours (900PM) and monitor                            closely - though with likely procedure tomorrow for                            cholecystectomy this will have to be determined                            with the surgical service.                           - Protonix 40 mg daily to be initiated.                           - Follow up pathology.                           - The  findings and recommendations were discussed                            with the patient.                           - The findings and recommendations were discussed                            with the patient's family.                           -  The findings and recommendations were discussed                            with the referring physician. Procedure Code(s):        --- Professional ---                           [phone], Endoscopic retrograde                            cholangiopancreatography (ERCP); with removal of                            calculi/debris from biliary/pancreatic duct(s)                           43262, Endoscopic retrograde                            cholangiopancreatography (ERCP); with                            sphincterotomy/papillotomy Diagnosis Code(s):        --- Professional ---                           K29.70, Gastritis, unspecified, without bleeding                           K83.8, Other specified diseases of biliary tract                           K80.50, Calculus of bile duct without cholangitis                            or cholecystitis without obstruction                           R94.5, Abnormal results of liver function studies                           R93.5, Abnormal findings on diagnostic imaging of                            other abdominal regions, including retroperitoneum                           R93.2, Abnormal findings on diagnostic imaging of                            liver and biliary tract CPT copyright 2019 [employer]. All rights reserved. The codes documented in this report are preliminary and upon coder review may  be revised to meet current compliance requirements. Justice Britain, MD 05/17/2020 3:29:57 PM Number of Addenda: 0

## 2020-05-17 NOTE — Anesthesia Preprocedure Evaluation (Addendum)
Anesthesia Evaluation  Patient identified by MRN, date of birth, ID band Patient awake    Reviewed: Allergy & Precautions, NPO status , Patient's Chart, lab work & pertinent test results  History of Anesthesia Complications Negative for: history of anesthetic complications  Airway Mallampati: II  TM Distance: >3 FB Neck ROM: Full    Dental  (+) Dental Advisory Given   Pulmonary sleep apnea (denies) , Patient abstained from smoking.,    Pulmonary exam normal        Cardiovascular hypertension (uncontrolled), + Peripheral Vascular Disease and + DVT  Normal cardiovascular exam    '20 TTE - EF 50 to 55%.Moderately increased left ventricular hypertrophy. Grade II diastolic dysfunction (pseudonormalization). RV has mildly reduced systolic function. LA was moderately dilated. Mild MR and TR. Aneurysm of the ascending aorta, measuring 40 mm. Moderately elevated pulmonary artery systolic pressure. Estimated right ventricular systolic pressure is moderately elevated at 47.3 mmHg.     Neuro/Psych  Headaches, negative psych ROS   GI/Hepatic Neg liver ROS, GERD  Controlled,  Endo/Other  diabetes, Type 2, Oral Hypoglycemic Agents  Renal/GU ESRF and DialysisRenal disease     Musculoskeletal  (+) Arthritis ,   Abdominal   Peds  Hematology  (+) anemia ,  Thrombocytopenia INR 1.6 Non-Hodgkin's lymphoma, s/p chemotherapy    Anesthesia Other Findings Covid test negative   Reproductive/Obstetrics                            Anesthesia Physical Anesthesia Plan  ASA: III  Anesthesia Plan: General   Post-op Pain Management:    Induction: Intravenous  PONV Risk Score and Plan: 2 and Treatment may vary due to age or medical condition, Ondansetron and Dexamethasone  Airway Management Planned: Oral ETT  Additional Equipment: None  Intra-op Plan:   Post-operative Plan: Extubation in OR  Informed  Consent: I have reviewed the patients History and Physical, chart, labs and discussed the procedure including the risks, benefits and alternatives for the proposed anesthesia with the patient or authorized representative who has indicated his/her understanding and acceptance.     Dental advisory given  Plan Discussed with: CRNA and Anesthesiologist  Anesthesia Plan Comments:        Anesthesia Quick Evaluation

## 2020-05-17 NOTE — Anesthesia Procedure Notes (Signed)
Procedure Name: Intubation Date/Time: 05/17/2020 2:04 PM Performed by: Griffin Dakin, CRNA Pre-anesthesia Checklist: Patient identified, Emergency Drugs available, Suction available and Patient being monitored Patient Re-evaluated:Patient Re-evaluated prior to induction Oxygen Delivery Method: Circle system utilized Preoxygenation: Pre-oxygenation with 100% oxygen Induction Type: IV induction and Rapid sequence Laryngoscope Size: Mac and 4 Grade View: Grade I Tube type: Oral Tube size: 7.5 mm Number of attempts: 1 Airway Equipment and Method: Stylet and Oral airway Placement Confirmation: ETT inserted through vocal cords under direct vision,  positive ETCO2 and breath sounds checked- equal and bilateral Secured at: 23 cm Tube secured with: Tape Dental Injury: Teeth and Oropharynx as per pre-operative assessment

## 2020-05-18 ENCOUNTER — Inpatient Hospital Stay (HOSPITAL_COMMUNITY): Payer: Medicare Other | Admitting: Anesthesiology

## 2020-05-18 ENCOUNTER — Inpatient Hospital Stay (HOSPITAL_COMMUNITY): Payer: Medicare Other

## 2020-05-18 ENCOUNTER — Encounter (HOSPITAL_COMMUNITY)
Admission: EM | Disposition: A | Discharge status: Home Health | Payer: Self-pay | Source: Home / Self Care | Attending: Internal Medicine

## 2020-05-18 HISTORY — PX: CHOLECYSTECTOMY: SHX55

## 2020-05-18 LAB — POCT I-STAT, CHEM 8
BUN: 43 mg/dL — ABNORMAL HIGH (ref 8–23)
Calcium, Ion: 0.92 mmol/L — ABNORMAL LOW (ref 1.15–1.40)
Chloride: 98 mmol/L (ref 98–111)
Creatinine, Ser: 8.9 mg/dL — ABNORMAL HIGH (ref 0.61–1.24)
Glucose, Bld: 111 mg/dL — ABNORMAL HIGH (ref 70–99)
HCT: 37 % — ABNORMAL LOW (ref 39.0–52.0)
Hemoglobin: 12.6 g/dL — ABNORMAL LOW (ref 13.0–17.0)
Potassium: 4.1 mmol/L (ref 3.5–5.1)
Sodium: 137 mmol/L (ref 135–145)
TCO2: 27 mmol/L (ref 22–32)

## 2020-05-18 LAB — HEPARIN LEVEL (UNFRACTIONATED): Heparin Unfractionated: <0.1 IU/mL — ABNORMAL LOW (ref 0.30–0.70)

## 2020-05-18 LAB — CBC
HCT: 35.3 % — ABNORMAL LOW (ref 39.0–52.0)
HCT: 38 % — ABNORMAL LOW (ref 39.0–52.0)
Hemoglobin: 11.6 g/dL — ABNORMAL LOW (ref 13.0–17.0)
Hemoglobin: 12.5 g/dL — ABNORMAL LOW (ref 13.0–17.0)
MCH: 30.9 pg (ref 26.0–34.0)
MCH: 30.8 pg (ref 26.0–34.0)
MCHC: 32.9 g/dL (ref 30.0–36.0)
MCHC: 32.9 g/dL (ref 30.0–36.0)
MCV: 93.9 fL (ref 80.0–100.0)
MCV: 93.6 fL (ref 80.0–100.0)
Platelets: 126 10*3/uL — ABNORMAL LOW (ref 150–400)
Platelets: 128 10*3/uL — ABNORMAL LOW (ref 150–400)
RBC: 3.76 MIL/uL — ABNORMAL LOW (ref 4.22–5.81)
RBC: 4.06 MIL/uL — ABNORMAL LOW (ref 4.22–5.81)
RDW: 15.2 % (ref 11.5–15.5)
RDW: 15.2 % (ref 11.5–15.5)
WBC: 5.2 10*3/uL (ref 4.0–10.5)
WBC: 15.4 10*3/uL — ABNORMAL HIGH (ref 4.0–10.5)
nRBC: 0 % (ref 0.0–0.2)
nRBC: 0 % (ref 0.0–0.2)

## 2020-05-18 LAB — PROTIME-INR
INR: 1.4 — ABNORMAL HIGH (ref 0.8–1.2)
Prothrombin Time: 16.6 seconds — ABNORMAL HIGH (ref 11.4–15.2)

## 2020-05-18 LAB — HEPATITIS B CORE ANTIBODY, IGM: Hep B C IgM: NONREACTIVE

## 2020-05-18 LAB — SURGICAL PCR SCREEN
MRSA, PCR: NEGATIVE
Staphylococcus aureus: NEGATIVE

## 2020-05-18 LAB — GLUCOSE, CAPILLARY
Glucose-Capillary: 157 mg/dL — ABNORMAL HIGH (ref 70–99)
Glucose-Capillary: 141 mg/dL — ABNORMAL HIGH (ref 70–99)
Glucose-Capillary: 108 mg/dL — ABNORMAL HIGH (ref 70–99)

## 2020-05-18 LAB — HEPATITIS B SURFACE ANTIGEN: Hepatitis B Surface Ag: NONREACTIVE

## 2020-05-18 SURGERY — LAPAROSCOPIC CHOLECYSTECTOMY WITH INTRAOPERATIVE CHOLANGIOGRAM
Anesthesia: General | Site: Abdomen

## 2020-05-18 MED ORDER — PHENYLEPHRINE HCL-NACL 10-0.9 MG/250ML-% IV SOLN
INTRAVENOUS | Status: DC | PRN
  Administered 2020-05-18: 25 ug/min via INTRAVENOUS

## 2020-05-18 MED ORDER — DEXAMETHASONE SODIUM PHOSPHATE 10 MG/ML IJ SOLN
INTRAMUSCULAR | Status: AC
  Filled 2020-05-18: qty 1

## 2020-05-18 MED ORDER — OXYCODONE HCL 5 MG/5ML PO SOLN: 5.0000 mg | Freq: Once | ORAL | Status: DC | PRN

## 2020-05-18 MED ORDER — FENTANYL CITRATE (PF) 250 MCG/5ML IJ SOLN
INTRAMUSCULAR | Status: AC
  Filled 2020-05-18: qty 5

## 2020-05-18 MED ORDER — ONDANSETRON HCL 4 MG/2ML IJ SOLN: 4.0000 mg | Freq: Once | INTRAMUSCULAR | Status: DC | PRN

## 2020-05-18 MED ORDER — BUPIVACAINE HCL (PF) 0.5 % IJ SOLN
INTRAMUSCULAR | Status: DC | PRN
  Administered 2020-05-18: 30 mL

## 2020-05-18 MED ORDER — CHLORHEXIDINE GLUCONATE 0.12 % MT SOLN: 15.0000 mL | Freq: Once | OROMUCOSAL | Status: AC

## 2020-05-18 MED ORDER — ROCURONIUM BROMIDE 10 MG/ML (PF) SYRINGE
PREFILLED_SYRINGE | INTRAVENOUS | Status: AC
  Filled 2020-05-18: qty 10

## 2020-05-18 MED ORDER — PROPOFOL 10 MG/ML IV BOLUS
INTRAVENOUS | Status: DC | PRN
  Administered 2020-05-18: 150 mg via INTRAVENOUS

## 2020-05-18 MED ORDER — PENTAFLUOROPROP-TETRAFLUOROETH EX AERO: 1.0000 "application " | INHALATION_SPRAY | CUTANEOUS | Status: DC | PRN

## 2020-05-18 MED ORDER — HEPARIN (PORCINE) 25000 UT/250ML-% IV SOLN
1650.0000 [IU]/h | INTRAVENOUS | Status: DC
  Administered 2020-05-18 – 2020-05-20 (×4): 1650 [IU]/h via INTRAVENOUS
  Filled 2020-05-18 (×3): qty 250

## 2020-05-18 MED ORDER — ALTEPLASE 2 MG IJ SOLR: 2.0000 mg | Freq: Once | INTRAMUSCULAR | Status: DC | PRN

## 2020-05-18 MED ORDER — ROCURONIUM BROMIDE 10 MG/ML (PF) SYRINGE
PREFILLED_SYRINGE | INTRAVENOUS | Status: DC | PRN
  Administered 2020-05-18: 20 mg via INTRAVENOUS
  Administered 2020-05-18: 50 mg via INTRAVENOUS
  Administered 2020-05-18: 30 mg via INTRAVENOUS

## 2020-05-18 MED ORDER — ONDANSETRON HCL 4 MG/2ML IJ SOLN
INTRAMUSCULAR | Status: DC | PRN
  Administered 2020-05-18: 4 mg via INTRAVENOUS

## 2020-05-18 MED ORDER — SODIUM CHLORIDE 0.9 % IV SOLN
INTRAVENOUS | Status: DC | PRN
  Administered 2020-05-18: 10 mL

## 2020-05-18 MED ORDER — CEFAZOLIN SODIUM-DEXTROSE 2-3 GM-%(50ML) IV SOLR
INTRAVENOUS | Status: DC | PRN
  Administered 2020-05-18: 2 g via INTRAVENOUS

## 2020-05-18 MED ORDER — OXYCODONE HCL 5 MG PO TABS
5.0000 mg | ORAL_TABLET | Freq: Four times a day (QID) | ORAL | Status: DC | PRN
Start: 2020-05-18 — End: 2020-05-24
  Administered 2020-05-18 – 2020-05-23 (×7): 5 mg via ORAL
  Filled 2020-05-18 (×7): qty 1

## 2020-05-18 MED ORDER — SUGAMMADEX SODIUM 200 MG/2ML IV SOLN
INTRAVENOUS | Status: DC | PRN
  Administered 2020-05-18: 200 mg via INTRAVENOUS

## 2020-05-18 MED ORDER — LIDOCAINE HCL (PF) 1 % IJ SOLN: 5.0000 mL | INTRAMUSCULAR | Status: DC | PRN

## 2020-05-18 MED ORDER — 0.9 % SODIUM CHLORIDE (POUR BTL) OPTIME
TOPICAL | Status: DC | PRN
  Administered 2020-05-18: 1000 mL

## 2020-05-18 MED ORDER — OXYCODONE HCL 5 MG PO TABS: 5.0000 mg | ORAL_TABLET | Freq: Once | ORAL | Status: DC | PRN

## 2020-05-18 MED ORDER — HEPARIN SODIUM (PORCINE) 1000 UNIT/ML DIALYSIS: 1000.0000 [IU] | INTRAMUSCULAR | Status: DC | PRN

## 2020-05-18 MED ORDER — BUPIVACAINE HCL (PF) 0.5 % IJ SOLN
INTRAMUSCULAR | Status: AC
  Filled 2020-05-18: qty 30

## 2020-05-18 MED ORDER — HYDROMORPHONE HCL 1 MG/ML IJ SOLN: 0.2500 mg | INTRAMUSCULAR | Status: DC | PRN

## 2020-05-18 MED ORDER — SODIUM CHLORIDE 0.9 % IR SOLN
Status: DC | PRN
  Administered 2020-05-18: 1000 mL

## 2020-05-18 MED ORDER — CHLORHEXIDINE GLUCONATE 0.12 % MT SOLN
OROMUCOSAL | Status: AC
  Administered 2020-05-18: 15 mL via OROMUCOSAL
  Filled 2020-05-18: qty 15

## 2020-05-18 MED ORDER — LIDOCAINE 2% (20 MG/ML) 5 ML SYRINGE
INTRAMUSCULAR | Status: AC
  Filled 2020-05-18: qty 5

## 2020-05-18 MED ORDER — FENTANYL CITRATE (PF) 250 MCG/5ML IJ SOLN
INTRAMUSCULAR | Status: DC | PRN
  Administered 2020-05-18 (×3): 50 ug via INTRAVENOUS

## 2020-05-18 MED ORDER — SODIUM CHLORIDE 0.9 % IV SOLN: 100.0000 mL | INTRAVENOUS | Status: DC | PRN

## 2020-05-18 MED ORDER — LIDOCAINE 2% (20 MG/ML) 5 ML SYRINGE
INTRAMUSCULAR | Status: DC | PRN
  Administered 2020-05-18: 100 mg via INTRAVENOUS

## 2020-05-18 MED ORDER — SODIUM CHLORIDE 0.9 % IV SOLN: INTRAVENOUS | Status: DC

## 2020-05-18 MED ORDER — DEXAMETHASONE SODIUM PHOSPHATE 10 MG/ML IJ SOLN
INTRAMUSCULAR | Status: DC | PRN
  Administered 2020-05-18: 5 mg via INTRAVENOUS

## 2020-05-18 MED ORDER — ONDANSETRON HCL 4 MG/2ML IJ SOLN
INTRAMUSCULAR | Status: AC
  Filled 2020-05-18: qty 2

## 2020-05-18 MED ORDER — FENTANYL CITRATE (PF) 100 MCG/2ML IJ SOLN: 25.0000 ug | INTRAMUSCULAR | Status: DC | PRN

## 2020-05-18 MED ORDER — LIDOCAINE-PRILOCAINE 2.5-2.5 % EX CREA: 1.0000 "application " | TOPICAL_CREAM | CUTANEOUS | Status: DC | PRN

## 2020-05-18 SURGICAL SUPPLY — 41 items
ADH SKN CLS APL DERMABOND .7 (GAUZE/BANDAGES/DRESSINGS) ×1
APL PRP STRL LF DISP 70% ISPRP (MISCELLANEOUS) ×1
APPLIER CLIP ROT 10 11.4 M/L (STAPLE) ×2
APR CLP MED LRG 11.4X10 (STAPLE) ×1
BAG SPEC RTRVL 10 TROC 200 (ENDOMECHANICALS) ×1
CANISTER SUCT 3000ML PPV (MISCELLANEOUS) ×2 IMPLANT
CHLORAPREP W/TINT 26 (MISCELLANEOUS) ×2 IMPLANT
CLIP APPLIE ROT 10 11.4 M/L (STAPLE) IMPLANT
CLIP VESOLOCK MED LG 6/CT (CLIP) ×1 IMPLANT
COVER MAYO STAND STRL (DRAPES) ×2 IMPLANT
COVER SURGICAL LIGHT HANDLE (MISCELLANEOUS) ×2 IMPLANT
DERMABOND ADVANCED (GAUZE/BANDAGES/DRESSINGS) ×1
DERMABOND ADVANCED .7 DNX12 (GAUZE/BANDAGES/DRESSINGS) ×1 IMPLANT
DRAPE C-ARM 42X120 X-RAY (DRAPES) ×2 IMPLANT
ELECT REM PT RETURN 9FT ADLT (ELECTROSURGICAL) ×2
ELECTRODE REM PT RTRN 9FT ADLT (ELECTROSURGICAL) ×1 IMPLANT
GLOVE BIOGEL PI IND STRL 7.0 (GLOVE) ×1 IMPLANT
GLOVE BIOGEL PI INDICATOR 7.0 (GLOVE) ×3
GLOVE SURG SS PI 7.0 STRL IVOR (GLOVE) ×5 IMPLANT
GOWN STRL REUS W/ TWL LRG LVL3 (GOWN DISPOSABLE) ×3 IMPLANT
GOWN STRL REUS W/TWL LRG LVL3 (GOWN DISPOSABLE) ×6
GRASPER SUT TROCAR 14GX15 (MISCELLANEOUS) ×2 IMPLANT
KIT BASIN OR (CUSTOM PROCEDURE TRAY) ×2 IMPLANT
KIT TURNOVER KIT B (KITS) ×2 IMPLANT
NEEDLE 22X1 1/2 (OR ONLY) (NEEDLE) ×2 IMPLANT
NS IRRIG 1000ML POUR BTL (IV SOLUTION) ×2 IMPLANT
PAD ARMBOARD 7.5X6 YLW CONV (MISCELLANEOUS) ×2 IMPLANT
POUCH RETRIEVAL ECOSAC 10 (ENDOMECHANICALS) ×1 IMPLANT
POUCH RETRIEVAL ECOSAC 10MM (ENDOMECHANICALS) ×2
SCISSORS LAP 5X35 DISP (ENDOMECHANICALS) ×2 IMPLANT
SET CHOLANGIOGRAPH 5 50 .035 (SET/KITS/TRAYS/PACK) ×1 IMPLANT
SET IRRIG TUBING LAPAROSCOPIC (IRRIGATION / IRRIGATOR) ×2 IMPLANT
SET TUBE SMOKE EVAC HIGH FLOW (TUBING) ×2 IMPLANT
SLEEVE ENDOPATH XCEL 5M (ENDOMECHANICALS) ×4 IMPLANT
SPECIMEN JAR SMALL (MISCELLANEOUS) ×2 IMPLANT
SUT MNCRL AB 4-0 PS2 18 (SUTURE) ×2 IMPLANT
TOWEL GREEN STERILE (TOWEL DISPOSABLE) ×2 IMPLANT
TOWEL GREEN STERILE FF (TOWEL DISPOSABLE) ×2 IMPLANT
TRAY LAPAROSCOPIC MC (CUSTOM PROCEDURE TRAY) ×2 IMPLANT
TROCAR XCEL 12X100 BLDLESS (ENDOMECHANICALS) ×2 IMPLANT
TROCAR XCEL NON-BLD 5MMX100MML (ENDOMECHANICALS) ×2 IMPLANT

## 2020-05-18 NOTE — Progress Notes (Signed)
PROGRESS NOTE    [name]  BDZ:[id number] DOB: [birth year] DOA: 05/13/2020 PCP: Burnard Bunting, MD   Brief Narrative: 69 year old with past medical history significant for peripheral vascular disease a status post right transmetatarsal amputation, and non Hodgkin lymphoma, type 2 diabetes mellitus, hypertension, hyperlipidemia, chronic systolic congestive heart failure, A. fib on Coumadin, history of pericardial tamponade status post window, OSA not on CPAP, ESRD on hemodialysis TTS who presented from general surgery office with persistent abdominal pain over the past 2 or 3 weeks.  Patient report pain as intermittent, not worse after eating.  Patient denies any fever chills night sweats, nausea vomiting.  Evaluation in the ED vitals are stable, potassium 4.3, chloride 96, CO2 28, glucose 130, BUN 52, creatinine 9.6, glucose 130, lipase 43.  Bilirubin 0.8.  White blood cell 5.8, hemoglobin 11.6, platelets 154.  COVID-19 influenza negative.  Right upper quadrant ultrasound with large solitary gallstone without evidence of cholecystitis, CBD 10 mm consistent with mild dilation, increase echogenicity hepatic parenchyma, possible right nephrolithiasis.  GI, general surgery were consulted.  Hospitalist service consulted for admission and further evaluation and management.   Assessment & Plan:   Principal Problem:   Cholelithiasis without obstruction Active Problems:   Hyperlipidemia   ESRD (end stage renal disease) (HCC)   Systolic heart failure (HCC)   Hypotension   Class 1 obesity due to excess calories with body mass index (BMI) of 33.0 to 33.9 in adult   Choledocholithiasis  1-Choledocholithiasis: -Patient presented to the ED from general surgery office with persistent abdominal pain over the last 2 or 3 weeks. Patient was found to have CBD ductal dilation with a gallstone within the gallbladder on  right upper quadrant ultrasound. -CT renal was done.if with choledocholithiasis with  a small stone distal common bile duct measuring up to 8 mm with biliary ductal dilation, mild gallbladder distention and intraluminal gallstone with pericholecystic inflammation -Ord, GI and general surgery following. -Underwent ERCP 12/17: Gastritis, status post biopsy, the major papilla appeared to be prominent edematous, filling defect consistent with a stone and sludge were seen on the cholangiogram.  The entire main bile duct was severely dilated.  Choledocholithiasis was found.  Complete removal was accomplished by biliary sphincterotomy and balloon drugs. -Underwent cholecystectomy on 05/18/2020 -Pain management and nausea medication.  2-Chronic Systolic Congestive Heart Failure, nonischemic cardiomyopathy, essential hypertension History of left ventricular ejection fraction 30 to 35% with improvement to 50 to 55% in 2020 Patient was evaluated by cardiology on 05/13/2020 for preoperative evaluation.  No indication for further testing at this time. Currently holding Coumadin, plan to bridge with heparin. Not on ACE or ARB or beta-blocker at home due to history of hypotension.  3-Hypotension;  He was on midodrine, currently on hold due to elevated BP.    4-Paroxysmal A. fib/flutter: On anticoagulation Holding Coumadin pending ERCP. Heparin drip to be managed by pharmacy Plan to resume heparin Gtt today post cholecystectomy.  Resume coumadin probably tomorrow. Will discuss with sx.   5-ESRD on hemodialysis: Continue with dialysis TTS. Appreciate nephrology follow-up  History of non-Hodgkin lymphoma status post chemo in remission, history of sarcomatoid tumor scalp: Follow-up with oncology as an outpatient.  HTN; started on BP medications this admission. Monitor.  BP improved on Norvasc.    Estimated body mass index is 28.9 kg/m as calculated from the following:   Height as of this encounter: 6' 3.5" (1.918 m).   Weight as of this encounter: 106.3 kg.   DVT prophylaxis:  scd  Code Status: Full Code Family Communication: care discussed with  Disposition Plan:  Status is: Inpatient  Remains inpatient appropriate because:Ongoing active pain requiring inpatient pain management and IV treatments appropriate due to intensity of illness or inability to take PO   Dispo:  Patient From: Home  Planned Disposition: Home  Expected discharge date: 05/18/2020  Medically stable for discharge: No, plan for ERCP on 12/17         Consultants:   GI  Nephrology  Procedures:   None  Antimicrobials:    Subjective: He report RUQ pain, nausea. Had sx today.  He is going for HD.   Objective: Vitals:   05/18/20 0622 05/18/20 1147 05/18/20 1202 05/18/20 1217  BP: (!) 158/99 (!) 146/74 140/71 125/62  Pulse: 87 (!) 58 60 60  Resp: 17 18 15 17   Temp: 97.8 F (36.6 C) (!) 97.2 F (36.2 C)  (!) 97.5 F (36.4 C)  TempSrc: Oral     SpO2: 96% 100% 94% 95%  Weight:      Height:        Intake/Output Summary (Last 24 hours) at 05/18/2020 1346 Last data filed at 05/18/2020 1200 Gross per 24 hour  Intake 300 ml  Output 10 ml  Net 290 ml   Filed Weights   05/16/20 1030 05/16/20 1335 05/16/20 2133  Weight: 118.7 kg 116.4 kg 106.3 kg    Examination:  General exam: NAD Respiratory system: CTA Cardiovascular system: S 1, S 2 RRR Gastrointestinal system: BS present, soft, nt Central nervous system: Alert, following command Extremities: trace edema   Data Reviewed: I have personally reviewed following labs and imaging studies  CBC: Recent Labs  Lab 05/15/20 0151 05/16/20 0323 05/16/20 1048 05/17/20 0224 05/18/20 0227 05/18/20 0853  WBC 4.9 4.3 4.6 4.4 5.2  --   HGB 11.2* 11.8* 11.2* 11.7* 11.6* 12.6*  HCT 34.3* 35.6* 34.8* 36.4* 35.3* 37.0*  MCV 93.0 92.7 94.6 93.1 93.9  --   PLT 140* 128* 139* 124* 126*  --    Basic Metabolic Panel: Recent Labs  Lab 05/13/20 1017 05/14/20 0429 05/15/20 0151 05/16/20 1048 05/17/20 0224  05/18/20 0853  NA 142 141 140 136 136 137  K 4.3 4.7 4.6 4.1 4.0 4.1  CL 96* 99 98 94* 98 98  CO2 28 24 23 25 23   --   GLUCOSE 130* 73 133* 111* 86 111*  BUN 52* 60* 70* 37* 26* 43*  CREATININE 9.65* 11.08* 12.66* 9.14* 7.06* 8.90*  CALCIUM 8.4* 8.3* 7.9* 8.1* 8.1*  --   PHOS  --   --   --  4.8*  --   --    GFR: Estimated Creatinine Clearance: 10.7 mL/min (A) (by C-G formula based on SCr of 8.9 mg/dL (H)). Liver Function Tests: Recent Labs  Lab 05/13/20 1017 05/14/20 0429 05/15/20 0151 05/16/20 1048 05/17/20 0224  AST 24 28 26   --  26  ALT 27 26 30   --  32  ALKPHOS 121 134* 141*  --  139*  BILITOT 0.8 1.3* 1.2  --  1.3*  PROT 7.2 7.0 6.4*  --  6.9  ALBUMIN 3.4* 3.3* 3.0* 3.1* 3.1*   Recent Labs  Lab 05/13/20 1017  LIPASE 43   No results for input(s): AMMONIA in the last 168 hours. Coagulation Profile: Recent Labs  Lab 05/14/20 0429 05/15/20 0151 05/16/20 0323 05/17/20 0224 05/18/20 0227  INR 2.3* 2.3* 1.9* 1.6* 1.4*   Cardiac Enzymes: No results for input(s): CKTOTAL, CKMB, CKMBINDEX, TROPONINI  in the last 168 hours. BNP (last 3 results) No results for input(s): PROBNP in the last 8760 hours. HbA1C: No results for input(s): HGBA1C in the last 72 hours. CBG: Recent Labs  Lab 05/17/20 0635 05/17/20 1152 05/18/20 0550 05/18/20 0702 05/18/20 1148  GLUCAP 71 82 157* 141* 108*   Lipid Profile: No results for input(s): CHOL, HDL, LDLCALC, TRIG, CHOLHDL, LDLDIRECT in the last 72 hours. Thyroid Function Tests: No results for input(s): TSH, T4TOTAL, FREET4, T3FREE, THYROIDAB in the last 72 hours. Anemia Panel: No results for input(s): VITAMINB12, FOLATE, FERRITIN, TIBC, IRON, RETICCTPCT in the last 72 hours. Sepsis Labs: No results for input(s): PROCALCITON, LATICACIDVEN in the last 168 hours.  Recent Results (from the past 240 hour(s))  Resp Panel by RT-PCR (Flu A&B, Covid) Nasopharyngeal Swab     Status: None   Collection Time: 05/13/20  4:07 PM    Specimen: Nasopharyngeal Swab; Nasopharyngeal(NP) swabs in vial transport medium  Result Value Ref Range Status   SARS Coronavirus 2 by RT PCR NEGATIVE NEGATIVE Final    Comment: (NOTE) SARS-CoV-2 target nucleic acids are NOT DETECTED.  The SARS-CoV-2 RNA is generally detectable in upper respiratory specimens during the acute phase of infection. The lowest concentration of SARS-CoV-2 viral copies this assay can detect is 138 copies/mL. A negative result does not preclude SARS-Cov-2 infection and should not be used as the sole basis for treatment or other patient management decisions. A negative result may occur with  improper specimen collection/handling, submission of specimen other than nasopharyngeal swab, presence of viral mutation(s) within the areas targeted by this assay, and inadequate number of viral copies(<138 copies/mL). A negative result must be combined with clinical observations, patient history, and epidemiological information. The expected result is Negative.  Fact Sheet for Patients:  EntrepreneurPulse.com.au  Fact Sheet for Healthcare Providers:  IncredibleEmployment.be  This test is no t yet approved or cleared by the [place] FDA and  has been authorized for detection and/or diagnosis of SARS-CoV-2 by FDA under an Emergency Use Authorization (EUA). This EUA will remain  in effect (meaning this test can be used) for the duration of the COVID-19 declaration under Section 564(b)(1) of the Act, 21 U.S.C.section 360bbb-3(b)(1), unless the authorization is terminated  or revoked sooner.       Influenza A by PCR NEGATIVE NEGATIVE Final   Influenza B by PCR NEGATIVE NEGATIVE Final    Comment: (NOTE) The Xpert Xpress SARS-CoV-2/FLU/RSV plus assay is intended as an aid in the diagnosis of influenza from Nasopharyngeal swab specimens and should not be used as a sole basis for treatment. Nasal washings and aspirates are  unacceptable for Xpert Xpress SARS-CoV-2/FLU/RSV testing.  Fact Sheet for Patients: EntrepreneurPulse.com.au  Fact Sheet for Healthcare Providers: IncredibleEmployment.be  This test is not yet approved or cleared by the [place] FDA and has been authorized for detection and/or diagnosis of SARS-CoV-2 by FDA under an Emergency Use Authorization (EUA). This EUA will remain in effect (meaning this test can be used) for the duration of the COVID-19 declaration under Section 564(b)(1) of the Act, 21 U.S.C. section 360bbb-3(b)(1), unless the authorization is terminated or revoked.  Performed at [facility], [place] [address], [place], [name] [address]   MRSA PCR Screening     Status: None   Collection Time: 05/14/20  3:18 PM   Specimen: Nasopharyngeal  Result Value Ref Range Status   MRSA by PCR NEGATIVE NEGATIVE Final    Comment:  The GeneXpert MRSA Assay (FDA approved for NASAL specimens only), is one component of a comprehensive MRSA colonization surveillance program. It is not intended to diagnose MRSA infection nor to guide or monitor treatment for MRSA infections. Performed at [facility], [place] [address], [place], [place] [address]   Surgical pcr screen     Status: None   Collection Time: 05/18/20  2:11 AM   Specimen: Nasal Mucosa; Nasal Swab  Result Value Ref Range Status   MRSA, PCR NEGATIVE NEGATIVE Final   Staphylococcus aureus NEGATIVE NEGATIVE Final    Comment: (NOTE) The Xpert SA Assay (FDA approved for NASAL specimens in patients 40 years of age and older), is one component of a comprehensive surveillance program. It is not intended to diagnose infection nor to guide or monitor treatment. Performed at [facility], [place] [address] [place], [place] [address]          Radiology Studies: DG ERCP BILIARY & PANCREATIC DUCTS  Result Date: 05/17/2020 CLINICAL DATA:  69 year old male with a  history of cholelithiasis/choledocholithiasis EXAM: ERCP TECHNIQUE: Multiple spot images obtained with the fluoroscopic device and submitted for interpretation post-procedure. FLUOROSCOPY TIME:  Fluoroscopy Time:  1 minutes 8 seconds COMPARISON:  CT 05/13/2020 FINDINGS: Limited intraoperative fluoroscopic spot images during ERCP. Initial image demonstrates endoscope projecting over the upper abdomen. IVC filter is present. Soft point there is cannulation of the ampulla with retrograde infusion of contrast partially opacifying the extrahepatic biliary system. Deployment of a balloon retrieval catheter with single rounded filling defect evident. Final image demonstrates partial evacuation of the contrast. IMPRESSION: Limited images during ERCP demonstrates treatment of choledocholithiasis with deployment of a balloon retrieval catheter. Please refer to the dictated operative report for full details of intraoperative findings and procedure. Electronically Signed   By: Corrie Mckusick D.O.   On: 05/17/2020 15:36        Scheduled Meds: . amLODipine  5 mg Oral Daily  . Chlorhexidine Gluconate Cloth  6 each Topical Q0600  . cinacalcet  30 mg Oral BID AC  . doxercalciferol  5 mcg Intravenous Q T,Th,Sa-HD  . insulin aspart  0-6 Units Subcutaneous TID WC  . iron polysaccharides  150 mg Oral Daily  . multivitamin  1 tablet Oral QHS  . pantoprazole  40 mg Oral Daily  . sevelamer carbonate  800 mg Oral TID WC   Continuous Infusions: . heparin    . ondansetron (ZOFRAN) IV       LOS: 5 days    Time spent: 35 minutes.     Elmarie Shiley, MD [facility]   If 7PM-7AM, please contact night-coverage [email or web address]  05/18/2020, 1:46 PM

## 2020-05-18 NOTE — Progress Notes (Signed)
Pre Procedure note for inpatients:   [name] has been scheduled for Procedure(s): LAPAROSCOPIC CHOLECYSTECTOMY WITH POSSIBLE INTRAOPERATIVE CHOLANGIOGRAM (N/A) today. The various methods of treatment have been discussed with the patient. After consideration of the risks, benefits and treatment options the patient has consented to the planned procedure.   The patient has been seen and labs reviewed. There are no changes in the patient's condition to prevent proceeding with the planned procedure today.  Recent labs:  Lab Results  Component Value Date   WBC 5.2 05/18/2020   HGB 12.6 (L) 05/18/2020   HCT 37.0 (L) 05/18/2020   PLT 126 (L) 05/18/2020   GLUCOSE 111 (H) 05/18/2020   CHOL 129 01/14/2011   TRIG 137 01/14/2011   HDL 13 (L) 01/14/2011   LDLCALC 89 01/14/2011   ALT 32 05/17/2020   AST 26 05/17/2020   NA 137 05/18/2020   K 4.1 05/18/2020   CL 98 05/18/2020   CREATININE 8.90 (H) 05/18/2020   BUN 43 (H) 05/18/2020   CO2 23 05/17/2020   TSH 0.155 (L) 01/14/2011   INR 1.4 (H) 05/18/2020   HGBA1C 7.2 (H) 05/13/2020    [name], MD 05/18/2020 9:17 AM

## 2020-05-18 NOTE — Anesthesia Postprocedure Evaluation (Signed)
Anesthesia Post Note  Patient: [name]  Procedure(s) Performed: LAPAROSCOPIC CHOLECYSTECTOMY WITH INTRAOPERATIVE CHOLANGIOGRAM (N/A Abdomen)     Patient location during evaluation: PACU Anesthesia Type: General Level of consciousness: sedated and patient cooperative Pain management: pain level controlled Vital Signs Assessment: post-procedure vital signs reviewed and stable Respiratory status: spontaneous breathing Cardiovascular status: stable Anesthetic complications: no   No complications documented.  Last Vitals:  Vitals:   05/18/20 1831 05/18/20 2119  BP: 98/65 138/85  Pulse: 100 (!) 107  Resp: 16 17  Temp:  (!) 36.3 C  SpO2: 96% 92%    Last Pain:  Vitals:   05/18/20 2153  TempSrc:   PainSc: [place]

## 2020-05-18 NOTE — Progress Notes (Signed)
[name] Progress Note   Subjective:   Patient seen and examined at bedside post procedure.  Gallbladder removed this AM.  Complains of nausea and pain at surgical site.  Denies SOB, CP, vomiting, diarrhea, weakness, and fatigue.   Objective Vitals:   05/18/20 0622 05/18/20 1147 05/18/20 1202 05/18/20 1217  BP: (!) 158/99 (!) 146/74 140/71 125/62  Pulse: 87 (!) 58 60 60  Resp: 17 18 15 17   Temp: 97.8 F (36.6 C) (!) 97.2 F (36.2 C)  (!) 97.5 F (36.4 C)  TempSrc: Oral     SpO2: 96% 100% 94% 95%  Weight:      Height:       Physical Exam General:WDWN male in NAD Heart:RRR Lungs:CTAB Abdomen:soft, +tenderness, ND Extremities:no LE edema Dialysis Access: RU AVF +b   Filed Weights   05/16/20 1030 05/16/20 1335 05/16/20 2133  Weight: 118.7 kg 116.4 kg 106.3 kg    Intake/Output Summary (Last 24 hours) at 05/18/2020 1259 Last data filed at 05/18/2020 1200 Gross per 24 hour  Intake 300 ml  Output 10 ml  Net 290 ml    Additional Objective Labs: Basic Metabolic Panel: Recent Labs  Lab 05/15/20 0151 05/16/20 1048 05/17/20 0224 05/18/20 0853  NA 140 136 136 137  K 4.6 4.1 4.0 4.1  CL 98 94* 98 98  CO2 23 25 23   --   GLUCOSE 133* 111* 86 111*  BUN 70* 37* 26* 43*  CREATININE 12.66* 9.14* 7.06* 8.90*  CALCIUM 7.9* 8.1* 8.1*  --   PHOS  --  4.8*  --   --    Liver Function Tests: Recent Labs  Lab 05/14/20 0429 05/15/20 0151 05/16/20 1048 05/17/20 0224  AST 28 26  --  26  ALT 26 30  --  32  ALKPHOS 134* 141*  --  139*  BILITOT 1.3* 1.2  --  1.3*  PROT 7.0 6.4*  --  6.9  ALBUMIN 3.3* 3.0* 3.1* 3.1*   Recent Labs  Lab 05/13/20 1017  LIPASE 43   CBC: Recent Labs  Lab 05/15/20 0151 05/16/20 0323 05/16/20 1048 05/17/20 0224 05/18/20 0227 05/18/20 0853  WBC 4.9 4.3 4.6 4.4 5.2  --   HGB 11.2* 11.8* 11.2* 11.7* 11.6* 12.6*  HCT 34.3* 35.6* 34.8* 36.4* 35.3* 37.0*  MCV 93.0 92.7 94.6 93.1 93.9  --   PLT 140* 128* 139* 124* 126*   --    CBG: Recent Labs  Lab 05/17/20 0635 05/17/20 1152 05/18/20 0550 05/18/20 0702 05/18/20 1148  GLUCAP 71 82 157* 141* 108*   Studies/Results: DG ERCP BILIARY & PANCREATIC DUCTS  Result Date: 05/17/2020 CLINICAL DATA:  69 year old male with a history of cholelithiasis/choledocholithiasis EXAM: ERCP TECHNIQUE: Multiple spot images obtained with the fluoroscopic device and submitted for interpretation post-procedure. FLUOROSCOPY TIME:  Fluoroscopy Time:  1 minutes 8 seconds COMPARISON:  CT 05/13/2020 FINDINGS: Limited intraoperative fluoroscopic spot images during ERCP. Initial image demonstrates endoscope projecting over the upper abdomen. IVC filter is present. Soft point there is cannulation of the ampulla with retrograde infusion of contrast partially opacifying the extrahepatic biliary system. Deployment of a balloon retrieval catheter with single rounded filling defect evident. Final image demonstrates partial evacuation of the contrast. IMPRESSION: Limited images during ERCP demonstrates treatment of choledocholithiasis with deployment of a balloon retrieval catheter. Please refer to the dictated operative report for full details of intraoperative findings and procedure. Electronically Signed   By: Corrie Mckusick D.O.   On: 05/17/2020 15:36  Medications: . heparin    . ondansetron (ZOFRAN) IV     . amLODipine  5 mg Oral Daily  . Chlorhexidine Gluconate Cloth  6 each Topical Q0600  . cinacalcet  30 mg Oral BID AC  . doxercalciferol  5 mcg Intravenous Q T,Th,Sa-HD  . insulin aspart  0-6 Units Subcutaneous TID WC  . iron polysaccharides  150 mg Oral Daily  . multivitamin  1 tablet Oral QHS  . pantoprazole  40 mg Oral Daily  . sevelamer carbonate  800 mg Oral TID WC    Dialysis Orders: [place] on TTS 4 hours 15 minutes EDW 119.5 kg,2K, 2.25 calcium bath  RUA AVF No hep Hectorol 9 mics last PTH 252, 245(decrease to 5 mics this  hospitalization\  Assessment/Plan: 1. ESRD -HD TTS.  HD today per regular schedule post procedure.  K4.1 this AM.  2. Choledocholithiasis - ERCP completed yesterday and cholecystectomy today.  Per surgery.  3. Hypertension/volumeBP currently in goal. on Amlodipine 5 mg q day As anoutpatient midodrine listed,not needed in hospital. UF as tolerated.  4. Anemia -Hgb 12.6, No indication for ESA. Follow labs post surgery. 5. Metabolic bone disease -OP large dose Hectorol 9 mcg qHD decreased to 5 mcg during admission. last calcium corrected 8.8,last phosphorus 4.8.  Continue renvela and sensipar.  6. DM type II-plan for admit 7. Nutrition -renal carb modified/renal vitamin on Nepro with albumin 3.1    Jen Mow, PA-C [place] 05/18/2020,12:59 PM  LOS: 5 days

## 2020-05-18 NOTE — Anesthesia Procedure Notes (Signed)
Procedure Name: Intubation Date/Time: 05/18/2020 10:14 AM Performed by: Rande Brunt, CRNA Pre-anesthesia Checklist: Patient identified, Emergency Drugs available, Suction available and Patient being monitored Patient Re-evaluated:Patient Re-evaluated prior to induction Oxygen Delivery Method: Circle System Utilized Preoxygenation: Pre-oxygenation with 100% oxygen Induction Type: IV induction Ventilation: Mask ventilation without difficulty Laryngoscope Size: Mac and 4 Grade View: Grade II Tube type: Oral Tube size: 7.5 mm Number of attempts: 1 Airway Equipment and Method: Stylet and Oral airway Placement Confirmation: ETT inserted through vocal cords under direct vision,  positive ETCO2 and breath sounds checked- equal and bilateral Secured at: 22 cm Tube secured with: Tape Dental Injury: Teeth and Oropharynx as per pre-operative assessment

## 2020-05-18 NOTE — Op Note (Signed)
PATIENT:  [name]  69 y.o. male  PRE-OPERATIVE DIAGNOSIS:  CHOLEDOCHOLITHIASIS  POST-OPERATIVE DIAGNOSIS:  CHOLEDOCHOLITHIASIS  PROCEDURE:  Procedure(s): LAPAROSCOPIC CHOLECYSTECTOMY WITH INTRAOPERATIVE CHOLANGIOGRAM   SURGEON:  Surgeon(s): Tyric Rodeheaver, Arta Bruce, MD  ASSISTANT: none  ANESTHESIA:   local and general  Indications for procedure: [name] is a 69 y.o. male with symptoms of Abdominal pain consistent with gallbladder disease, Confirmed by ultrasound and ERCP.  Description of procedure: The patient was brought into the operative suite, placed supine. Anesthesia was administered with endotracheal tube. Patient was strapped in place and foot board was secured. All pressure points were offloaded by foam padding. The patient was prepped and draped in the usual sterile fashion.  A periumbilical incision was made and optical entry was used to enter the abdomen. 2 5 mm trocars were placed on in the right lateral space on in the right subcostal space. A 31mm trocar was placed in the subxiphoid space. Marcaine was infused to the subxiphoid space and lateral upper right abdomen in the transversus abdominis plane. Next the patient was placed in reverse trendelenberg. The gallbladder appeareddilated and chronically inflamed.   The gallbladder was retracted cephalad and lateral. The peritoneum was reflected off the infundibulum working lateral to medial. The cystic duct and cystic artery were identified and further dissection revealed a critical view, due to concern for choledocholithiasis a cholangiogram was performed with ductotomy and cook catheter passed through a separate subcostal stab incision. Cystic duct was possibly coming off the right hepatic duct. The cystic duct and cystic artery were doubly clipped and ligated.   The gallbladder was removed off the liver bed with cautery. The Gallbladder was placed in a specimen bag. The gallbladder fossa was irrigated and hemostasis  was applied with cautery. The gallbladder was removed via the 59mm trocar. The fascial defect was closed with interrupted 0 vicryl suture via laparoscopic trans-fascial suture passer. Pneumoperitoneum was removed, all trocar were removed. All incisions were closed with 4-0 monocryl subcuticular stitch. The patient woke from anesthesia and was brought to PACU in stable condition. All counts were correct  Findings: dilated chronically inflamed gallbladder, no filling defect on IOC  Specimen: gallbladder  Blood loss: 30 ml  Local anesthesia: 30 ml Marcaine  Complications: none  PLAN OF CARE: Admit to inpatient   PATIENT DISPOSITION:  PACU - hemodynamically stable.  Gurney Maxin, [name] General, Bariatric, & Minimally Invasive Surgery [facility]

## 2020-05-18 NOTE — Progress Notes (Signed)
ANTICOAGULATION CONSULT NOTE  Pharmacy Consult for Heparin Indication: atrial fibrillation  No Known Allergies  Patient Measurements: Height: 6' 3.5" (191.8 cm) Weight: 106.3 kg (234 lb 5.6 oz) IBW/kg (Calculated) : 85.65 Heparin Dosing Weight: 111.7 kg  Vital Signs: Temp: 97.5 F (36.4 C) (12/18 1217) Temp Source: Oral (12/18 0622) BP: 125/62 (12/18 1217) Pulse Rate: 60 (12/18 1217)  Labs: Recent Labs    05/16/20 0323 05/16/20 1048 05/16/20 1810 05/17/20 0224 05/18/20 0227 05/18/20 0853  HGB 11.8* 11.2*  --  11.7* 11.6* 12.6*  HCT 35.6* 34.8*  --  36.4* 35.3* 37.0*  PLT 128* 139*  --  124* 126*  --   LABPROT 21.1*  --   --  18.6* 16.6*  --   INR 1.9*  --   --  1.6* 1.4*  --   HEPARINUNFRC  --   --  0.41 0.58 <0.10*  --   CREATININE  --  9.14*  --  7.06*  --  8.90*    Estimated Creatinine Clearance: 10.7 mL/min (A) (by C-G formula based on SCr of 8.9 mg/dL (H)).   Medical History: Past Medical History:  Diagnosis Date  . Anemia   . Arthritis    HNP- lumbar, "all over my body"  . Blood transfusion    "years ago; blood was low" (08/05/2013)  . Diabetic nephropathy ([place])   . Diabetic retinopathy   . DVT (deep venous thrombosis) ([place])    "got one in my right leg now; I've had one before too, not sure which leg" (08/05/2013)  . ESRD (end stage renal disease) on dialysis ([place])    TTS, [place]  . Family history of anesthesia complication    " my son wakes up slowly"  . GERD (gastroesophageal reflux disease)    uses alka seltzere on occas.   [name])    "one q now and then" (08/05/2013)  . Hyperlipidemia   . Hypertension   . 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 Non Hodgkin's lymphoma ([place])    Tx 2009; "had chemo; it went away" (08/05/2013)  . Noncompliance 03/16/2012  . NSVT (nonsustained ventricular tachycardia) ([place]) 03/18/2012  . Peripheral vascular disease ([place])   . Poor  historian    pt. unsure of several answers to health history questions   . Skin cancer    melanoma - head  . Sleep apnea    "suppose to have a sleep study, but they never told me when. (08/05/2013)    Medications:  Scheduled:  . amLODipine  5 mg Oral Daily  . Chlorhexidine Gluconate Cloth  6 each Topical Q0600  . cinacalcet  30 mg Oral BID AC  . doxercalciferol  5 mcg Intravenous Q T,Th,Sa-HD  . insulin aspart  0-6 Units Subcutaneous TID WC  . iron polysaccharides  150 mg Oral Daily  . multivitamin  1 tablet Oral QHS  . pantoprazole  40 mg Oral Daily  . sevelamer carbonate  800 mg Oral TID WC    Assessment: Patient is a 69 yo male that is being admitted for Cholelithiasis, CBD dilatation. The patient is on warfarin at home for afib. At this time the patient's INR continues to be therapeutic at 2.3. Pharmacy has been asked to dose heparin in this patient for afib while holding warfarin.   Patient was previously therapeutic on heparin drip at 1650 units/hr. Heparin drip was held for cholecystectomy that was completed  12/18.    INR today 1.4 and heparin level currently undetectable (due to drip not running). CBC stable. Per [facility], restart heparin at 1600 on 12/18.  Will restart at previous regimen of 1650 units/hr this evening.   Goal of Therapy:  Heparin level 0.3-0.7 units/ml Monitor platelets by anticoagulation protocol: Yes   Plan:  Continue to hold warfarin  Restart heparin at 1650 units/hr this evening (1600) Monitor heparin level and CBC daily (anticipate patient will be therapeutic) F/u plans for oral anticoagulation  Dimple Nanas, PharmD PGY-1 Acute Care Pharmacy Resident Office: [phone] 05/18/2020 12:41 PM   Please check AMION for all [facility] phone numbers After 10:00 PM, call [place] [phone]

## 2020-05-18 NOTE — Progress Notes (Signed)
PT Cancellation Note  Patient Details Name: [name] MRN: [id number] DOB: August 06, 1950   Cancelled Treatment:    Reason Eval/Treat Not Completed: Patient at procedure or test/unavailable. Patient currently off unit. Will re-attempt PT at later date/time.   Juell Radney 05/18/2020, 8:54 AM

## 2020-05-18 NOTE — Discharge Instructions (Signed)
[facility] ° °Please arrive at least 30 min before your appointment to complete your check in paperwork.  If you are unable to arrive 30 min prior to your appointment time we may have to cancel or reschedule you. °LAPAROSCOPIC SURGERY: POST OP INSTRUCTIONS °Always review your discharge instruction sheet given to you by the facility where your surgery was performed. °IF YOU HAVE DISABILITY OR FAMILY LEAVE FORMS, YOU MUST BRING THEM TO THE OFFICE FOR PROCESSING.   °DO NOT GIVE THEM TO YOUR DOCTOR. ° °PAIN CONTROL ° °1. First take acetaminophen (Tylenol) AND/or ibuprofen (Advil) to control your pain after surgery.  Follow directions on package.  Taking acetaminophen (Tylenol) and/or ibuprofen (Advil) regularly after surgery will help to control your pain and lower the amount of prescription pain medication you may need.  You should not take more than 4,000 mg (4 grams) of acetaminophen (Tylenol) in 24 hours.  You should not take ibuprofen (Advil), aleve, motrin, naprosyn or other NSAIDS if you have a history of stomach ulcers or chronic kidney disease.  °2. A prescription for pain medication may be given to you upon discharge.  Take your pain medication as prescribed, if you still have uncontrolled pain after taking acetaminophen (Tylenol) or ibuprofen (Advil). °3. Use ice packs to help control pain. °4. If you need a refill on your pain medication, please contact your pharmacy.  They will contact our office to request authorization. Prescriptions will not be filled after 5pm or on week-ends. ° °HOME MEDICATIONS °5. Take your usually prescribed medications unless otherwise directed. ° °DIET °6. You should follow a light diet the first few days after arrival home.  Be sure to include lots of fluids daily. Avoid fatty, fried foods.  ° °CONSTIPATION °7. It is common to experience some constipation after surgery and if you are taking pain medication.  Increasing fluid intake and taking a stool  softener (such as Colace) will usually help or prevent this problem from occurring.  A mild laxative (Milk of Magnesia or Miralax) should be taken according to package instructions if there are no bowel movements after 48 hours. ° °WOUND/INCISION CARE °8. Most patients will experience some swelling and bruising in the area of the incisions.  Ice packs will help.  Swelling and bruising can take several days to resolve.  °9. Unless discharge instructions indicate otherwise, follow guidelines below  °a. STERI-STRIPS - you may remove your outer bandages 48 hours after surgery, and you may shower at that time.  You have steri-strips (small skin tapes) in place directly over the incision.  These strips should be left on the skin for 7-10 days.   °b. DERMABOND/SKIN GLUE - you may shower in 24 hours.  The glue will flake off over the next 2-3 weeks. °10. Any sutures or staples will be removed at the office during your follow-up visit. ° °ACTIVITIES °11. You may resume regular (light) daily activities beginning the next day--such as daily self-care, walking, climbing stairs--gradually increasing activities as tolerated.  You may have sexual intercourse when it is comfortable.  Refrain from any heavy lifting or straining until approved by your doctor. °a. You may drive when you are no longer taking prescription pain medication, you can comfortably wear a seatbelt, and you can safely maneuver your car and apply brakes. ° °FOLLOW-UP °12. You should see your doctor in the office for a follow-up appointment approximately 2-3 weeks after your surgery.  You should have been given your post-op/follow-up appointment when   your surgery was scheduled.  If you did not receive a post-op/follow-up appointment, make sure that you call for this appointment within a day or two after you arrive home to insure a convenient appointment time. ° °OTHER INSTRUCTIONS ° °WHEN TO CALL YOUR DOCTOR: °1. Fever over 101.0 °2. Inability to  urinate °3. Continued bleeding from incision. °4. Increased pain, redness, or drainage from the incision. °5. Increasing abdominal pain ° °The clinic staff is available to answer your questions during regular business hours.  Please don’t hesitate to call and ask to speak to one of the nurses for clinical concerns.  If you have a medical emergency, go to the nearest emergency room or call 911.  A surgeon from [facility] is always on call at the hospital. °[address], [address], [place], [place]  [address] ? [address] [address], [place], [place]   [address] °[phone] ? [phone] ? FAX [phone] ° ° ° °

## 2020-05-18 NOTE — Transfer of Care (Signed)
Immediate Anesthesia Transfer of Care Note  Patient: BRICEN VICTORY  Procedure(s) Performed: LAPAROSCOPIC CHOLECYSTECTOMY WITH INTRAOPERATIVE CHOLANGIOGRAM (N/A Abdomen)  Patient Location: PACU  Anesthesia Type:General  Level of Consciousness: drowsy  Airway & Oxygen Therapy: Patient Spontanous Breathing and Patient connected to face mask oxygen  Post-op Assessment: Report given to RN and Post -op Vital signs reviewed and stable  Post vital signs: Reviewed and stable  Last Vitals:  Vitals Value Taken Time  BP 146/74 05/18/20 1147  Temp 36.2 C 05/18/20 1147  Pulse 58 05/18/20 1149  Resp 15 05/18/20 1149  SpO2 96 % 05/18/20 1149  Vitals shown include unvalidated device data.  Last Pain:  Vitals:   05/18/20 1147  TempSrc:   PainSc: 0-No pain      Patients Stated Pain Goal: 0 (50/27/74 1287)  Complications: No complications documented.

## 2020-05-18 NOTE — Anesthesia Preprocedure Evaluation (Signed)
Anesthesia Evaluation  Patient identified by MRN, date of birth, ID band Patient awake    Reviewed: Allergy & Precautions, NPO status , Patient's Chart, lab work & pertinent test results  History of Anesthesia Complications Negative for: history of anesthetic complications  Airway Mallampati: II  TM Distance: >3 FB Neck ROM: Full    Dental  (+) Dental Advisory Given   Pulmonary sleep apnea (denies) , Patient abstained from smoking.,    Pulmonary exam normal        Cardiovascular hypertension, + Peripheral Vascular Disease and + DVT  Normal cardiovascular exam Rhythm:Regular    '20 TTE - EF 50 to 55%.Moderately increased left ventricular hypertrophy. Grade II diastolic dysfunction (pseudonormalization). RV has mildly reduced systolic function. LA was moderately dilated. Mild MR and TR. Aneurysm of the ascending aorta, measuring 40 mm. Moderately elevated pulmonary artery systolic pressure. Estimated right ventricular systolic pressure is moderately elevated at 47.3 mmHg.     Neuro/Psych  Headaches, negative psych ROS   GI/Hepatic Neg liver ROS, GERD  Controlled,  Endo/Other  diabetes, Type 2, Oral Hypoglycemic Agents  Renal/GU ESRF and DialysisRenal disease     Musculoskeletal  (+) Arthritis ,   Abdominal   Peds  Hematology  (+) anemia ,  Thrombocytopenia INR 1.6 Non-Hodgkin's lymphoma, s/p chemotherapy    Anesthesia Other Findings Covid test negative   Reproductive/Obstetrics                             Anesthesia Physical  Anesthesia Plan  ASA: III  Anesthesia Plan: General   Post-op Pain Management:    Induction: Intravenous  PONV Risk Score and Plan: 2 and Treatment may vary due to age or medical condition, Ondansetron and Dexamethasone  Airway Management Planned: Oral ETT  Additional Equipment: None  Intra-op Plan:   Post-operative Plan: Extubation in OR  Informed  Consent: I have reviewed the patients History and Physical, chart, labs and discussed the procedure including the risks, benefits and alternatives for the proposed anesthesia with the patient or authorized representative who has indicated his/her understanding and acceptance.     Dental advisory given  Plan Discussed with: CRNA  Anesthesia Plan Comments:        Anesthesia Quick Evaluation

## 2020-05-19 ENCOUNTER — Encounter (HOSPITAL_COMMUNITY): Payer: Self-pay | Admitting: General Surgery

## 2020-05-19 LAB — PROTIME-INR
INR: 1.4 — ABNORMAL HIGH (ref 0.8–1.2)
Prothrombin Time: 16.3 seconds — ABNORMAL HIGH (ref 11.4–15.2)

## 2020-05-19 LAB — COMPREHENSIVE METABOLIC PANEL
ALT: 41 U/L (ref 0–44)
AST: 47 U/L — ABNORMAL HIGH (ref 15–41)
Albumin: 3.2 g/dL — ABNORMAL LOW (ref 3.5–5.0)
Alkaline Phosphatase: 146 U/L — ABNORMAL HIGH (ref 38–126)
Anion gap: 18 — ABNORMAL HIGH (ref 5–15)
BUN: 25 mg/dL — ABNORMAL HIGH (ref 8–23)
CO2: 24 mmol/L (ref 22–32)
Calcium: 8.3 mg/dL — ABNORMAL LOW (ref 8.9–10.3)
Chloride: 96 mmol/L — ABNORMAL LOW (ref 98–111)
Creatinine, Ser: 6.93 mg/dL — ABNORMAL HIGH (ref 0.61–1.24)
GFR, Estimated: 8 mL/min — ABNORMAL LOW (ref >60)
Glucose, Bld: 141 mg/dL — ABNORMAL HIGH (ref 70–99)
Potassium: 4.6 mmol/L (ref 3.5–5.1)
Sodium: 138 mmol/L (ref 135–145)
Total Bilirubin: 1.1 mg/dL (ref 0.3–1.2)
Total Protein: 7.1 g/dL (ref 6.5–8.1)

## 2020-05-19 LAB — CBC
HCT: 38.2 % — ABNORMAL LOW (ref 39.0–52.0)
Hemoglobin: 12.4 g/dL — ABNORMAL LOW (ref 13.0–17.0)
MCH: 30.4 pg (ref 26.0–34.0)
MCHC: 32.5 g/dL (ref 30.0–36.0)
MCV: 93.6 fL (ref 80.0–100.0)
Platelets: 144 10*3/uL — ABNORMAL LOW (ref 150–400)
RBC: 4.08 MIL/uL — ABNORMAL LOW (ref 4.22–5.81)
RDW: 15.2 % (ref 11.5–15.5)
WBC: 11.7 10*3/uL — ABNORMAL HIGH (ref 4.0–10.5)
nRBC: 0 % (ref 0.0–0.2)

## 2020-05-19 LAB — GLUCOSE, CAPILLARY
Glucose-Capillary: 119 mg/dL — ABNORMAL HIGH (ref 70–99)
Glucose-Capillary: 155 mg/dL — ABNORMAL HIGH (ref 70–99)
Glucose-Capillary: 188 mg/dL — ABNORMAL HIGH (ref 70–99)
Glucose-Capillary: 139 mg/dL — ABNORMAL HIGH (ref 70–99)

## 2020-05-19 LAB — HEPARIN LEVEL (UNFRACTIONATED): Heparin Unfractionated: 0.47 IU/mL (ref 0.30–0.70)

## 2020-05-19 MED ORDER — WARFARIN SODIUM 7.5 MG PO TABS
7.5000 mg | ORAL_TABLET | Freq: Once | ORAL | Status: AC
  Administered 2020-05-19: 7.5 mg via ORAL
  Filled 2020-05-19: qty 1

## 2020-05-19 MED ORDER — WARFARIN - PHARMACIST DOSING INPATIENT: Freq: Every day | Status: DC

## 2020-05-19 MED ORDER — PROMETHAZINE HCL 25 MG/ML IJ SOLN: 12.5000 mg | Freq: Four times a day (QID) | INTRAMUSCULAR | Status: DC | PRN

## 2020-05-19 MED ORDER — ONDANSETRON HCL 4 MG/2ML IJ SOLN: 4.0000 mg | Freq: Three times a day (TID) | INTRAMUSCULAR | Status: DC | PRN

## 2020-05-19 NOTE — Progress Notes (Signed)
PT Cancellation Note  Patient Details Name: [name] MRN: [id number] DOB: Aug 08, 1950   Cancelled Treatment:    Reason Eval/Treat Not Completed: Patient declined working with therapy at this time as he had just gotten his lunch tray. Will check back as schedule allows.   Thelma Comp 05/19/2020, 11:59 AM   Rolinda Roan, PT, DPT Acute Rehabilitation Services Pager: [phone] Office: [phone]

## 2020-05-19 NOTE — Progress Notes (Signed)
PROGRESS NOTE    [name]  GDJ:[id number] DOB: April 30, 1951 DOA: 05/13/2020 PCP: Burnard Bunting, MD   Brief Narrative: 69 year old with past medical history significant for peripheral vascular disease a status post right transmetatarsal amputation, and non Hodgkin lymphoma, type 2 diabetes mellitus, hypertension, hyperlipidemia, chronic systolic congestive heart failure, A. fib on Coumadin, history of pericardial tamponade status post window, OSA not on CPAP, ESRD on hemodialysis TTS who presented from general surgery office with persistent abdominal pain over the past 2 or 3 weeks.  Patient report pain as intermittent, not worse after eating.  Patient denies any fever chills night sweats, nausea vomiting.  Evaluation in the ED vitals are stable, potassium 4.3, chloride 96, CO2 28, glucose 130, BUN 52, creatinine 9.6, glucose 130, lipase 43.  Bilirubin 0.8.  White blood cell 5.8, hemoglobin 11.6, platelets 154.  COVID-19 influenza negative.  Right upper quadrant ultrasound with large solitary gallstone without evidence of cholecystitis, CBD 10 mm consistent with mild dilation, increase echogenicity hepatic parenchyma, possible right nephrolithiasis.  GI, general surgery were consulted.  Hospitalist service consulted for admission and further evaluation and management.   Assessment & Plan:   Principal Problem:   Cholelithiasis without obstruction Active Problems:   Hyperlipidemia   ESRD (end stage renal disease) (HCC)   Systolic heart failure (HCC)   Hypotension   Class 1 obesity due to excess calories with body mass index (BMI) of 33.0 to 33.9 in adult   Choledocholithiasis  1-Choledocholithiasis: -Patient presented to the ED from general surgery office with persistent abdominal pain over the last 2 or 3 weeks. Patient was found to have CBD ductal dilation with a gallstone within the gallbladder on  right upper quadrant ultrasound. -CT renal was done.if with choledocholithiasis with  a small stone distal common bile duct measuring up to 8 mm with biliary ductal dilation, mild gallbladder distention and intraluminal gallstone with pericholecystic inflammation -, GI and general surgery following. -Underwent ERCP 12/17: Gastritis, status post biopsy, the major papilla appeared to be prominent edematous, filling defect consistent with a stone and sludge were seen on the cholangiogram.  The entire main bile duct was severely dilated.  Choledocholithiasis was found.  Complete removal was accomplished by biliary sphincterotomy and balloon drugs. -Underwent cholecystectomy on 05/18/2020 -plan to advance diet today, denies worsening pain, denies nausea.   2-Chronic Systolic Congestive Heart Failure, nonischemic cardiomyopathy, essential hypertension History of left ventricular ejection fraction 30 to 35% with improvement to 50 to 55% in 2020. Patient was evaluated by cardiology on 05/13/2020 for preoperative evaluation.  No indication for further testing at this time. Not on ACE or ARB or beta-blocker at home due to history of hypotension.  3-Hypotension;  He was on midodrine, currently on hold due to elevated BP.    4-Paroxysmal A. fib/flutter: On anticoagulation Coumadin for held prior to  ERCP and cholecystectomy.  Heparin drip to be managed by pharmacy On heparin Gtt/  Resume coumadin today.   5-ESRD on hemodialysis: Continue with dialysis TTS. Appreciate nephrology follow-up  History of non-Hodgkin lymphoma status post chemo in remission, history of sarcomatoid tumor scalp: Follow-up with oncology as an outpatient.  HTN; started on BP medications this admission. Monitor.  BP improved on Norvasc.    Estimated body mass index is 31.49 kg/m as calculated from the following:   Height as of this encounter: 6' 3.5" (1.918 m).   Weight as of this encounter: 115.8 kg.   DVT prophylaxis: scd Code Status: Full Code Family Communication: care  discussed with   Disposition Plan:  Status is: Inpatient  Remains inpatient appropriate because:Ongoing active pain requiring inpatient pain management and IV treatments appropriate due to intensity of illness or inability to take PO   Dispo:  Patient From: Home  Planned Disposition: Home  Expected discharge date: 05/18/2020  Medically stable for discharge: No, plan for ERCP on 12/17         Consultants:   GI  Nephrology  Procedures:   None  Antimicrobials:    Subjective: He is feeling better today./ denies nausea. Complaints of abdominal pain. No BM yet,   Objective: Vitals:   05/18/20 1831 05/18/20 2119 05/19/20 0502 05/19/20 0900  BP: 98/65 138/85 (!) 143/96 (!) 91/59  Pulse: 100 (!) 107 (!) 105 86  Resp: 16 17 16    Temp:  (!) 97.4 F (36.3 C) 98.6 F (37 C) 98 F (36.7 C)  TempSrc:   Oral Oral  SpO2: 96% 92% 97% 98%  Weight:  115.8 kg    Height:        Intake/Output Summary (Last 24 hours) at 05/19/2020 1308 Last data filed at 05/19/2020 0900 Gross per 24 hour  Intake 789.39 ml  Output 1262 ml  Net -472.61 ml   Filed Weights   05/18/20 1422 05/18/20 1746 05/18/20 2119  Weight: 116 kg 115.8 kg 115.8 kg    Examination:  General exam: NAD Respiratory system: CTA Cardiovascular system: S 1, S 2 RRR Gastrointestinal system: BS present, soft, nt Central nervous system: alert, following command Extremities: Trace edema   Data Reviewed: I have personally reviewed following labs and imaging studies  CBC: Recent Labs  Lab 05/16/20 1048 05/17/20 0224 05/18/20 0227 05/18/20 0853 05/18/20 1921 05/19/20 0138  WBC 4.6 4.4 5.2  --  15.4* 11.7*  HGB 11.2* 11.7* 11.6* 12.6* 12.5* 12.4*  HCT 34.8* 36.4* 35.3* 37.0* 38.0* 38.2*  MCV 94.6 93.1 93.9  --  93.6 93.6  PLT 139* 124* 126*  --  128* 144*   Basic Metabolic Panel: Recent Labs  Lab 05/14/20 0429 05/15/20 0151 05/16/20 1048 05/17/20 0224 05/18/20 0853 05/19/20 0138  NA 141 140 136 136 137 138   K 4.7 4.6 4.1 4.0 4.1 4.6  CL 99 98 94* 98 98 96*  CO2 24 23 25 23   --  24  GLUCOSE 73 133* 111* 86 111* 141*  BUN 60* 70* 37* 26* 43* 25*  CREATININE 11.08* 12.66* 9.14* 7.06* 8.90* 6.93*  CALCIUM 8.3* 7.9* 8.1* 8.1*  --  8.3*  PHOS  --   --  4.8*  --   --   --    GFR: Estimated Creatinine Clearance: 14.3 mL/min (A) (by C-G formula based on SCr of 6.93 mg/dL (H)). Liver Function Tests: Recent Labs  Lab 05/13/20 1017 05/14/20 0429 05/15/20 0151 05/16/20 1048 05/17/20 0224 05/19/20 0138  AST 24 28 26   --  26 47*  ALT 27 26 30   --  32 41  ALKPHOS 121 134* 141*  --  139* 146*  BILITOT 0.8 1.3* 1.2  --  1.3* 1.1  PROT 7.2 7.0 6.4*  --  6.9 7.1  ALBUMIN 3.4* 3.3* 3.0* 3.1* 3.1* 3.2*   Recent Labs  Lab 05/13/20 1017  LIPASE 43   No results for input(s): AMMONIA in the last 168 hours. Coagulation Profile: Recent Labs  Lab 05/15/20 0151 05/16/20 0323 05/17/20 0224 05/18/20 0227 05/19/20 0138  INR 2.3* 1.9* 1.6* 1.4* 1.4*   Cardiac Enzymes: No results for input(s): CKTOTAL,  CKMB, CKMBINDEX, TROPONINI in the last 168 hours. BNP (last 3 results) No results for input(s): PROBNP in the last 8760 hours. HbA1C: No results for input(s): HGBA1C in the last 72 hours. CBG: Recent Labs  Lab 05/18/20 0550 05/18/20 0702 05/18/20 1148 05/19/20 0705 05/19/20 1134  GLUCAP 157* 141* 108* 119* 155*   Lipid Profile: No results for input(s): CHOL, HDL, LDLCALC, TRIG, CHOLHDL, LDLDIRECT in the last 72 hours. Thyroid Function Tests: No results for input(s): TSH, T4TOTAL, FREET4, T3FREE, THYROIDAB in the last 72 hours. Anemia Panel: No results for input(s): VITAMINB12, FOLATE, FERRITIN, TIBC, IRON, RETICCTPCT in the last 72 hours. Sepsis Labs: No results for input(s): PROCALCITON, LATICACIDVEN in the last 168 hours.  Recent Results (from the past 240 hour(s))  Resp Panel by RT-PCR (Flu A&B, Covid) Nasopharyngeal Swab     Status: None   Collection Time: 05/13/20  4:07 PM    Specimen: Nasopharyngeal Swab; Nasopharyngeal(NP) swabs in vial transport medium  Result Value Ref Range Status   SARS Coronavirus 2 by RT PCR NEGATIVE NEGATIVE Final    Comment: (NOTE) SARS-CoV-2 target nucleic acids are NOT DETECTED.  The SARS-CoV-2 RNA is generally detectable in upper respiratory specimens during the acute phase of infection. The lowest concentration of SARS-CoV-2 viral copies this assay can detect is 138 copies/mL. A negative result does not preclude SARS-Cov-2 infection and should not be used as the sole basis for treatment or other patient management decisions. A negative result may occur with  improper specimen collection/handling, submission of specimen other than nasopharyngeal swab, presence of viral mutation(s) within the areas targeted by this assay, and inadequate number of viral copies(<138 copies/mL). A negative result must be combined with clinical observations, patient history, and epidemiological information. The expected result is Negative.  Fact Sheet for Patients:  EntrepreneurPulse.com.au  Fact Sheet for Healthcare Providers:  IncredibleEmployment.be  This test is no t yet approved or cleared by the [place] FDA and  has been authorized for detection and/or diagnosis of SARS-CoV-2 by FDA under an Emergency Use Authorization (EUA). This EUA will remain  in effect (meaning this test can be used) for the duration of the COVID-19 declaration under Section 564(b)(1) of the Act, 21 U.S.C.section 360bbb-3(b)(1), unless the authorization is terminated  or revoked sooner.       Influenza A by PCR NEGATIVE NEGATIVE Final   Influenza B by PCR NEGATIVE NEGATIVE Final    Comment: (NOTE) The Xpert Xpress SARS-CoV-2/FLU/RSV plus assay is intended as an aid in the diagnosis of influenza from Nasopharyngeal swab specimens and should not be used as a sole basis for treatment. Nasal washings and aspirates are  unacceptable for Xpert Xpress SARS-CoV-2/FLU/RSV testing.  Fact Sheet for Patients: EntrepreneurPulse.com.au  Fact Sheet for Healthcare Providers: IncredibleEmployment.be  This test is not yet approved or cleared by the [place] FDA and has been authorized for detection and/or diagnosis of SARS-CoV-2 by FDA under an Emergency Use Authorization (EUA). This EUA will remain in effect (meaning this test can be used) for the duration of the COVID-19 declaration under Section 564(b)(1) of the Act, 21 U.S.C. section 360bbb-3(b)(1), unless the authorization is terminated or revoked.  Performed at [facility], [place] [address], [place], [place] [address]   MRSA PCR Screening     Status: None   Collection Time: 05/14/20  3:18 PM   Specimen: Nasopharyngeal  Result Value Ref Range Status   MRSA by PCR NEGATIVE NEGATIVE Final    Comment:  The GeneXpert MRSA Assay (FDA approved for NASAL specimens only), is one component of a comprehensive MRSA colonization surveillance program. It is not intended to diagnose MRSA infection nor to guide or monitor treatment for MRSA infections. Performed at [facility], [place] [address] [place], [place] [address]   Surgical pcr screen     Status: None   Collection Time: 05/18/20  2:11 AM   Specimen: Nasal Mucosa; Nasal Swab  Result Value Ref Range Status   MRSA, PCR NEGATIVE NEGATIVE Final   Staphylococcus aureus NEGATIVE NEGATIVE Final    Comment: (NOTE) The Xpert SA Assay (FDA approved for NASAL specimens in patients 64 years of age and older), is one component of a comprehensive surveillance program. It is not intended to diagnose infection nor to guide or monitor treatment. Performed at [facility], [place] [address] [place], [place] [address]          Radiology Studies: DG Cholangiogram Operative  Result Date: 05/18/2020 CLINICAL DATA:  Cholecystectomy for cholelithiasis  and choledocholithiasis. EXAM: INTRAOPERATIVE CHOLANGIOGRAM TECHNIQUE: Cholangiographic images from the C-arm fluoroscopic device were submitted for interpretation post-operatively. Please see the procedural report for the amount of contrast and the fluoroscopy time utilized. COMPARISON:  Ultrasound and CT studies on 05/13/2020 FINDINGS: Intraoperative imaging with a C-arm demonstrates a dilated common bile duct without discrete filling defects. Contrast enters the duodenum. IVC filter present. IMPRESSION: Dilated common bile duct without discrete filling defects. Electronically Signed   By: Aletta Edouard [name]   On: 05/18/2020 13:45   DG ERCP BILIARY & PANCREATIC DUCTS  Result Date: 05/17/2020 CLINICAL DATA:  69 year old male with a history of cholelithiasis/choledocholithiasis EXAM: ERCP TECHNIQUE: Multiple spot images obtained with the fluoroscopic device and submitted for interpretation post-procedure. FLUOROSCOPY TIME:  Fluoroscopy Time:  1 minutes 8 seconds COMPARISON:  CT 05/13/2020 FINDINGS: Limited intraoperative fluoroscopic spot images during ERCP. Initial image demonstrates endoscope projecting over the upper abdomen. IVC filter is present. Soft point there is cannulation of the ampulla with retrograde infusion of contrast partially opacifying the extrahepatic biliary system. Deployment of a balloon retrieval catheter with single rounded filling defect evident. Final image demonstrates partial evacuation of the contrast. IMPRESSION: Limited images during ERCP demonstrates treatment of choledocholithiasis with deployment of a balloon retrieval catheter. Please refer to the dictated operative report for full details of intraoperative findings and procedure. Electronically Signed   By: Corrie Mckusick D.O.   On: 05/17/2020 15:36        Scheduled Meds: . amLODipine  5 mg Oral Daily  . Chlorhexidine Gluconate Cloth  6 each Topical Q0600  . cinacalcet  30 mg Oral BID AC  . doxercalciferol  5 mcg  Intravenous Q T,Th,Sa-HD  . insulin aspart  0-6 Units Subcutaneous TID WC  . iron polysaccharides  150 mg Oral Daily  . multivitamin  1 tablet Oral QHS  . pantoprazole  40 mg Oral Daily  . sevelamer carbonate  800 mg Oral TID WC   Continuous Infusions: . heparin 1,650 Units/hr (05/19/20 0107)     LOS: 6 days    Time spent: 35 minutes.     Elmarie Shiley, MD [facility]   If 7PM-7AM, please contact night-coverage [email or web address]  05/19/2020, 1:08 PM

## 2020-05-19 NOTE — Progress Notes (Signed)
[facility] Progress Note   Subjective:   Patient seen and examined at bedside.  Reports he is feeling better today.  Abdominal pain mostly well controlled.  Denies nausea, vomiting, diarrhea, CP, SOB and edema.  Dialysis yesterday went well.  No bleeding from AVF post HD.  Concerned about his missing toboggan, says last saw it when he went down for surgery.  Nurse aware.   Objective Vitals:   05/18/20 1831 05/18/20 2119 05/19/20 0502 05/19/20 0900  BP: 98/65 138/85 (!) 143/96 (!) 91/59  Pulse: 100 (!) 107 (!) 105 86  Resp: 16 17 16    Temp:  (!) 97.4 F (36.3 C) 98.6 F (37 C) 98 F (36.7 C)  TempSrc:   Oral Oral  SpO2: 96% 92% 97% 98%  Weight:  115.8 kg    Height:       Physical Exam General:WDWN male in NAD Heart:RRR, no mrg Lungs:CTAB, nml WOB Abdomen:soft, ND, +tenderness Extremities:no LE edema Dialysis Access: RU AVF +b/t   Filed Weights   05/18/20 1422 05/18/20 1746 05/18/20 2119  Weight: 116 kg 115.8 kg 115.8 kg    Intake/Output Summary (Last 24 hours) at 05/19/2020 1100 Last data filed at 05/19/2020 0900 Gross per 24 hour  Intake 789.39 ml  Output 1272 ml  Net -482.61 ml    Additional Objective Labs: Basic Metabolic Panel: Recent Labs  Lab 05/16/20 1048 05/17/20 0224 05/18/20 0853 05/19/20 0138  NA 136 136 137 138  K 4.1 4.0 4.1 4.6  CL 94* 98 98 96*  CO2 25 23  --  24  GLUCOSE 111* 86 111* 141*  BUN 37* 26* 43* 25*  CREATININE 9.14* 7.06* 8.90* 6.93*  CALCIUM 8.1* 8.1*  --  8.3*  PHOS 4.8*  --   --   --    Liver Function Tests: Recent Labs  Lab 05/15/20 0151 05/16/20 1048 05/17/20 0224 05/19/20 0138  AST 26  --  26 47*  ALT 30  --  32 41  ALKPHOS 141*  --  139* 146*  BILITOT 1.2  --  1.3* 1.1  PROT 6.4*  --  6.9 7.1  ALBUMIN 3.0* 3.1* 3.1* 3.2*   Recent Labs  Lab 05/13/20 1017  LIPASE 43   CBC: Recent Labs  Lab 05/16/20 1048 05/17/20 0224 05/18/20 0227 05/18/20 0853 05/18/20 1921 05/19/20 0138  WBC 4.6  4.4 5.2  --  15.4* 11.7*  HGB 11.2* 11.7* 11.6* 12.6* 12.5* 12.4*  HCT 34.8* 36.4* 35.3* 37.0* 38.0* 38.2*  MCV 94.6 93.1 93.9  --  93.6 93.6  PLT 139* 124* 126*  --  128* 144*   Blood Culture    Component Value Date/Time   SDES FLUID PERICARDIAL 01/10/2018 1015   SPECREQUEST B 01/10/2018 1015   CULT  01/10/2018 1015    NO GROWTH 3 DAYS Performed at [facility], [place] [address], [place], [place] [address]    REPTSTATUS 01/13/2018 FINAL 01/10/2018 1015    Cardiac Enzymes: No results for input(s): CKTOTAL, CKMB, CKMBINDEX, TROPONINI in the last 168 hours. CBG: Recent Labs  Lab 05/17/20 1152 05/18/20 0550 05/18/20 0702 05/18/20 1148 05/19/20 0705  GLUCAP 82 157* 141* 108* 119*   Studies/Results: DG Cholangiogram Operative  Result Date: 05/18/2020 CLINICAL DATA:  Cholecystectomy for cholelithiasis and choledocholithiasis. EXAM: INTRAOPERATIVE CHOLANGIOGRAM TECHNIQUE: Cholangiographic images from the C-arm fluoroscopic device were submitted for interpretation post-operatively. Please see the procedural report for the amount of contrast and the fluoroscopy time utilized. COMPARISON:  Ultrasound and CT  studies on 05/13/2020 FINDINGS: Intraoperative imaging with a C-arm demonstrates a dilated common bile duct without discrete filling defects. Contrast enters the duodenum. IVC filter present. IMPRESSION: Dilated common bile duct without discrete filling defects. Electronically Signed   By: Aletta Edouard [name]   On: 05/18/2020 13:45   DG ERCP BILIARY & PANCREATIC DUCTS  Result Date: 05/17/2020 CLINICAL DATA:  69 year old male with a history of cholelithiasis/choledocholithiasis EXAM: ERCP TECHNIQUE: Multiple spot images obtained with the fluoroscopic device and submitted for interpretation post-procedure. FLUOROSCOPY TIME:  Fluoroscopy Time:  1 minutes 8 seconds COMPARISON:  CT 05/13/2020 FINDINGS: Limited intraoperative fluoroscopic spot images during ERCP. Initial image  demonstrates endoscope projecting over the upper abdomen. IVC filter is present. Soft point there is cannulation of the ampulla with retrograde infusion of contrast partially opacifying the extrahepatic biliary system. Deployment of a balloon retrieval catheter with single rounded filling defect evident. Final image demonstrates partial evacuation of the contrast. IMPRESSION: Limited images during ERCP demonstrates treatment of choledocholithiasis with deployment of a balloon retrieval catheter. Please refer to the dictated operative report for full details of intraoperative findings and procedure. Electronically Signed   By: Corrie Mckusick D.O.   On: 05/17/2020 15:36    Medications: . heparin 1,650 Units/hr (05/19/20 0107)  . ondansetron (ZOFRAN) IV     . amLODipine  5 mg Oral Daily  . Chlorhexidine Gluconate Cloth  6 each Topical Q0600  . cinacalcet  30 mg Oral BID AC  . doxercalciferol  5 mcg Intravenous Q T,Th,Sa-HD  . insulin aspart  0-6 Units Subcutaneous TID WC  . iron polysaccharides  150 mg Oral Daily  . multivitamin  1 tablet Oral QHS  . pantoprazole  40 mg Oral Daily  . sevelamer carbonate  800 mg Oral TID WC    Dialysis Orders: [place] on TTS 4 hours 15 minutes EDW 119.5 kg,2K, 2.25 calcium bath  RUA AVF No hep Hectorol 9 mics last PTH 252, 245(decrease to 5 mics this hospitalization\  Assessment/Plan: 1. ESRD -HD TTS.  HD yesterday tolerated well.  K 4.6, next HD 12/21. 2. Choledocholithiasis- ERCP completed 12/17 and cholecystectomy 12/18.  Pain well controlled. Cleared for d/c from surgery standpoint.  3. Hypertension/volumeBP variable, soft this AM. Started on Amlodipine 5 mg q day during admission.  If BP remain low would d/c. As anoutpatient midodrine listed,not needed in hospital. UF as tolerated. 4. Anemia -Hgb 12.4 today, No indication for ESA.Follow labs. 5. Metabolic bone disease -OP large dose Hectorol 4mcg qHDdecreased to 5 mcg during  admission.last calcium corrected 8.9,lastphosphorus 4.8. Continue renvela and sensipar.  6. DM type II-plan for admit 7. Nutrition -renal carb modified/renal vitamin on Nepro with albumin 3.1 8. A fib - per surgery ok to restart warfarin today.     Jen Mow, PA-C [facility] 05/19/2020,11:00 AM  LOS: 6 days

## 2020-05-19 NOTE — Progress Notes (Signed)
[place] for Heparin --> warfarin Indication: atrial fibrillation  No Known Allergies  Patient Measurements: Height: 6' 3.5" (191.8 cm) Weight: 115.8 kg (255 lb 4.7 oz) IBW/kg (Calculated) : 85.65 Heparin Dosing Weight: 111.7 kg  Vital Signs: Temp: 98 F (36.7 C) (12/19 0900) Temp Source: Oral (12/19 0900) BP: 91/59 (12/19 0900) Pulse Rate: 86 (12/19 0900)  Labs: Recent Labs    05/17/20 0224 05/18/20 0227 05/18/20 0853 05/18/20 1921 05/19/20 0138  HGB 11.7* 11.6* 12.6* 12.5* 12.4*  HCT 36.4* 35.3* 37.0* 38.0* 38.2*  PLT 124* 126*  --  128* 144*  LABPROT 18.6* 16.6*  --   --  16.3*  INR 1.6* 1.4*  --   --  1.4*  HEPARINUNFRC 0.58 <0.10*  --   --  0.47  CREATININE 7.06*  --  8.90*  --  6.93*    Estimated Creatinine Clearance: 14.3 mL/min (A) (by C-G formula based on SCr of 6.93 mg/dL (H)).   Medical History: Past Medical History:  Diagnosis Date  . Anemia   . Arthritis    HNP- lumbar, "all over my body"  . Blood transfusion    "years ago; blood was low" (08/05/2013)  . Diabetic nephropathy ([place])   . Diabetic retinopathy   . DVT (deep venous thrombosis) ([place])    "got one in my right leg now; I've had one before too, not sure which leg" (08/05/2013)  . ESRD (end stage renal disease) on dialysis ([place])    TTS, [place]  . Family history of anesthesia complication    " my son wakes up slowly"  . GERD (gastroesophageal reflux disease)    uses alka seltzere on occas.   [name])    "one q now and then" (08/05/2013)  . Hyperlipidemia   . Hypertension   . 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 Non Hodgkin's lymphoma ([place])    Tx 2009; "had chemo; it went away" (08/05/2013)  . Noncompliance 03/16/2012  . NSVT (nonsustained ventricular tachycardia) ([place]) 03/18/2012  . Peripheral vascular disease ([place])   . Poor historian    pt. unsure of several  answers to health history questions   . Skin cancer    melanoma - head  . Sleep apnea    "suppose to have a sleep study, but they never told me when. (08/05/2013)    Medications:  Scheduled:  . amLODipine  5 mg Oral Daily  . Chlorhexidine Gluconate Cloth  6 each Topical Q0600  . cinacalcet  30 mg Oral BID AC  . doxercalciferol  5 mcg Intravenous Q T,Th,Sa-HD  . insulin aspart  0-6 Units Subcutaneous TID WC  . iron polysaccharides  150 mg Oral Daily  . multivitamin  1 tablet Oral QHS  . pantoprazole  40 mg Oral Daily  . sevelamer carbonate  800 mg Oral TID WC    Assessment: Patient is a 69 yo male that is being admitted for Cholelithiasis, CBD dilatation. The patient is on warfarin at home for afib.  PTA dose is 5mg  daily except for 7.5mg  on Tuesdays.  Pharmacy has been asked to restart warfarin.  Heparin level therapeutic at 0.47. CBC stable.  No issues with bleeding or line per RN.  Continue at same rate of 1650 units/hr.  INR today subtherapeutic at 1.4.  Will initiate warfarin 7.5mg  tonight (based on protocol recommendation of 1.5x home regimen today).  Continue heparin drip  until INR therapeutic.   Goal of Therapy:  Heparin level 0.3-0.7 units/ml Monitor platelets by anticoagulation protocol: Yes   Plan:  Warfarin 7.5mg  tonight  Continue heparin at 1650 units/hr  F/u daily HL, daily CBC, and daily INR   Dimple Nanas, PharmD PGY-1 Acute Care Pharmacy Resident Office: [phone] 05/19/2020 1:19 PM   Please check AMION for all [place] phone numbers After 10:00 PM, call [place] [phone]

## 2020-05-19 NOTE — Progress Notes (Signed)
Physical Therapy Treatment Patient Details Name: John FILLEY MRN: [id number] DOB: 03/07/1951 Today's Date: 05/19/2020    History of Present Illness 69 yo male presenting with abdominal pain; choledocholithiasis, scheduled for GB evaluation on 1/10. PMH including OSA not on CPAP; PVD s/p R TMA; NHL; DM; HTN; HLD; chronic systolic CHF; afib/flutter; h/o pericardial tamponade s/p window; and ESRD on TTS HD.    PT Comments    Pt progressing towards physical therapy goals. Overall limited due to pain however good rehab effort and was able to walk in the hallway with RW for support. Pt reports he will have his brother at home with him and feels he will be able to manage well when he gets there. Anticipate pt will progress well when pain is better controlled. Will continue to follow and progress as able per POC.     Follow Up Recommendations  Home health PT;Supervision for mobility/OOB     Equipment Recommendations  Rolling walker with 5" wheels;3in1 (PT)    Recommendations for Other Services       Precautions / Restrictions Precautions Precautions: Fall Restrictions Weight Bearing Restrictions: No    Mobility  Bed Mobility Overal bed mobility: Needs Assistance Bed Mobility: Supine to Sit;Sit to Supine     Supine to sit: Min guard Sit to supine: Supervision   General bed mobility comments: Increased time and use of railing required due to pain.  Transfers Overall transfer level: Needs assistance Equipment used: Rolling walker (2 wheeled) Transfers: Sit to/from Stand Sit to Stand: Supervision         General transfer comment: Increased time due to pain, but pt was able to power-up to full stand without assist. Close supervision provided for safety.  Ambulation/Gait Ambulation/Gait assistance: Min guard Gait Distance (Feet): 150 Feet Assistive device: Rolling walker (2 wheeled) Gait Pattern/deviations: Step-through pattern;Decreased step length - right;Decreased step  length - left;Trunk flexed;Decreased stride length Gait velocity: decreased Gait velocity interpretation: <1.31 ft/sec, indicative of household ambulator General Gait Details: Slow but generally steady with RW for support. 2 standing rest breaks due to pain.   Stairs             Wheelchair Mobility    Modified Rankin (Stroke Patients Only)       Balance Overall balance assessment: Needs assistance Sitting-balance support: No upper extremity supported;Feet supported Sitting balance-Leahy Scale: Good     Standing balance support: No upper extremity supported;During functional activity Standing balance-Leahy Scale: Fair Standing balance comment: reliant on BUE support                            Cognition Arousal/Alertness: Awake/alert Behavior During Therapy: WFL for tasks assessed/performed Overall Cognitive Status: Within Functional Limits for tasks assessed                                        Exercises      General Comments        Pertinent Vitals/Pain Pain Assessment: 0-10 Pain Score: 10-Worst pain ever Pain Location: R lower quadrant of abdomen Pain Descriptors / Indicators: Operative site guarding;Constant;Moaning Pain Intervention(s): Limited activity within patient's tolerance;Monitored during session;Repositioned;Premedicated before session;Patient requesting pain meds-RN notified    Home Living                      Prior Function  PT Goals (current goals can now be found in the care plan section) Acute Rehab PT Goals Patient Stated Goal: Go home PT Goal Formulation: With patient Time For Goal Achievement: 05/30/20 Potential to Achieve Goals: Good Progress towards PT goals: Progressing toward goals    Frequency    Min 3X/week      PT Plan Current plan remains appropriate    Co-evaluation              AM-PAC PT "6 Clicks" Mobility   Outcome Measure  Help needed turning from  your back to your side while in a flat bed without using bedrails?: A Little Help needed moving from lying on your back to sitting on the side of a flat bed without using bedrails?: A Little Help needed moving to and from a bed to a chair (including a wheelchair)?: A Little Help needed standing up from a chair using your arms (e.g., wheelchair or bedside chair)?: A Little Help needed to walk in hospital room?: A Little Help needed climbing 3-5 steps with a railing? : A Lot 6 Click Score: 17    End of Session Equipment Utilized During Treatment: Gait belt Activity Tolerance: Patient tolerated treatment well Patient left: in bed;with call bell/phone within reach;Other (comment) (transport tech present and attending) Nurse Communication: Mobility status PT Visit Diagnosis: Unsteadiness on feet (R26.81);Difficulty in walking, not elsewhere classified (R26.2);Muscle weakness (generalized) (M62.81)     Time: 6720-9470 PT Time Calculation (min) (ACUTE ONLY): 25 min  Charges:  $Gait Training: 23-37 mins                     Rolinda Roan, PT, DPT Acute Rehabilitation Services Pager: [phone] Office: [phone]    Thelma Comp 05/19/2020, 3:25 PM

## 2020-05-19 NOTE — Progress Notes (Signed)
[facility] Progress Note  1 Day Post-Op  Subjective: - s/p laparoscopic cholecystectomy yesterday which the patient tolerated well without complication - Tolerating liquids without nausea or vomiting - Hgb stable, WBC count decreased to 11 from 15 - No acute complaints   Objective: Vital signs in last 24 hours: Temp:  [97.2 F (36.2 C)-98.6 F (37 C)] 98.6 F (37 C) (12/19 0502) Pulse Rate:  [58-107] 105 (12/19 0502) Resp:  [13-18] 16 (12/19 0502) BP: (73-146)/(52-96) 143/96 (12/19 0502) SpO2:  [92 %-100 %] 97 % (12/19 0502) Weight:  [115.8 kg-116 kg] 115.8 kg (12/18 2119) Last BM Date: 05/17/20  Intake/Output from previous day: 12/18 0701 - 12/19 0700 In: 549.4 [P.O.:360; I.V.:189.4] Out: 1272 [Blood:10] Intake/Output this shift: No intake/output data recorded.  PE: Gen:  Alert, NAD, pleasant Heart: irregular, mild tachycardia Lungs: CTAB, no wheezes, rhonchi, or rales noted. Respiratory effort nonlabored Abd: soft,appropriately tender to palpation, ND, incisions c/d/i with surgical glue in place, no masses, hernias, or organomegaly Psych: A&Ox3 with an appropriate affect. Skin: no rashes noted, warm and dry  Lab Results:  Recent Labs    05/18/20 1921 05/19/20 0138  WBC 15.4* 11.7*  HGB 12.5* 12.4*  HCT 38.0* 38.2*  PLT 128* 144*   BMET Recent Labs    05/17/20 0224 05/18/20 0853 05/19/20 0138  NA 136 137 138  K 4.0 4.1 4.6  CL 98 98 96*  CO2 23  --  24  GLUCOSE 86 111* 141*  BUN 26* 43* 25*  CREATININE 7.06* 8.90* 6.93*  CALCIUM 8.1*  --  8.3*   PT/INR Recent Labs    05/18/20 0227 05/19/20 0138  LABPROT 16.6* 16.3*  INR 1.4* 1.4*   CMP     Component Value Date/Time   NA 138 05/19/2020 0138   NA 142 09/04/2016 1628   K 4.6 05/19/2020 0138   CL 96 (L) 05/19/2020 0138   CO2 24 05/19/2020 0138   GLUCOSE 141 (H) 05/19/2020 0138   BUN 25 (H) 05/19/2020 0138   BUN 22 09/04/2016 1628   CREATININE 6.93 (H) 05/19/2020 0138    CALCIUM 8.3 (L) 05/19/2020 0138   CALCIUM 9.1 01/09/2013 0903   PROT 7.1 05/19/2020 0138   ALBUMIN 3.2 (L) 05/19/2020 0138   AST 47 (H) 05/19/2020 0138   ALT 41 05/19/2020 0138   ALKPHOS 146 (H) 05/19/2020 0138   BILITOT 1.1 05/19/2020 0138   GFRNONAA 8 (L) 05/19/2020 0138   GFRAA 16 (L) 07/16/2018 2320   Lipase     Component Value Date/Time   LIPASE 43 05/13/2020 1017       Studies/Results: DG Cholangiogram Operative  Result Date: 05/18/2020 CLINICAL DATA:  Cholecystectomy for cholelithiasis and choledocholithiasis. EXAM: INTRAOPERATIVE CHOLANGIOGRAM TECHNIQUE: Cholangiographic images from the C-arm fluoroscopic device were submitted for interpretation post-operatively. Please see the procedural report for the amount of contrast and the fluoroscopy time utilized. COMPARISON:  Ultrasound and CT studies on 05/13/2020 FINDINGS: Intraoperative imaging with a C-arm demonstrates a dilated common bile duct without discrete filling defects. Contrast enters the duodenum. IVC filter present. IMPRESSION: Dilated common bile duct without discrete filling defects. Electronically Signed   By: Aletta Edouard [name]   On: 05/18/2020 13:45   DG ERCP BILIARY & PANCREATIC DUCTS  Result Date: 05/17/2020 CLINICAL DATA:  69 year old male with a history of cholelithiasis/choledocholithiasis EXAM: ERCP TECHNIQUE: Multiple spot images obtained with the fluoroscopic device and submitted for interpretation post-procedure. FLUOROSCOPY TIME:  Fluoroscopy Time:  1 minutes 8 seconds COMPARISON:  CT 05/13/2020 FINDINGS: Limited intraoperative fluoroscopic spot images during ERCP. Initial image demonstrates endoscope projecting over the upper abdomen. IVC filter is present. Soft point there is cannulation of the ampulla with retrograde infusion of contrast partially opacifying the extrahepatic biliary system. Deployment of a balloon retrieval catheter with single rounded filling defect evident. Final image demonstrates  partial evacuation of the contrast. IMPRESSION: Limited images during ERCP demonstrates treatment of choledocholithiasis with deployment of a balloon retrieval catheter. Please refer to the dictated operative report for full details of intraoperative findings and procedure. Electronically Signed   By: Corrie Mckusick D.O.   On: 05/17/2020 15:36    Anti-infectives: Anti-infectives (From admission, onward)   Start     Dose/Rate Route Frequency Ordered Stop   05/17/20 1715  ceFAZolin (ANCEF) IVPB 2g/100 mL premix  Status:  Discontinued        2 g 200 mL/hr over 30 Minutes Intravenous On call to O.R. 05/17/20 1557 05/17/20 1708       Assessment/Plan ESRD on HD T/Th/Sat Hypotension - more hypertension now, not requiring midodrine with HD, started on amlodipine 5mg  qd IDDM HLD PVD H/o DVT with filter on Coumadin Hx Non-Hodgkin's lymphoma 2009 s/p chemotherapy Non-ischemic cardiomyopathy with HF (improvd EF 50-55% on ECHO 04/2019) Atrial fibrillation H/o pericardial effusion s/p pericardial window 2019   Choledocholithiasis -CT from12/13confirmed choledocholithiasis. GI has seenthe patient and recommend ERCP once INR down. Continue holding coumadin, he isonheparin gtt. - s/p ERCP with ductal clearance 12/17 - s/p laparoscopic cholecystectomy 27/06 without complication  ID - none FEN - Advance to renal diet  Foley - none VTE - heparin gtt, OK to restart home warfarin today  Follow up - TBD  Plan:  Appropriate for discharge from surgical perspective   LOS: 6 days    Sheria Lang, MD [facility] Surgery 05/19/2020, 9:45 AM Please see Amion for pager number during day hours 7:00am-4:30pm

## 2020-05-19 NOTE — Progress Notes (Addendum)
ANTICOAGULATION CONSULT NOTE  Pharmacy Consult for Heparin Indication: atrial fibrillation  No Known Allergies  Patient Measurements: Height: 6' 3.5" (191.8 cm) Weight: 115.8 kg (255 lb 4.7 oz) IBW/kg (Calculated) : 85.65 Heparin Dosing Weight: 111.7 kg  Vital Signs: Temp: 98.6 F (37 C) (12/19 0502) Temp Source: Oral (12/19 0502) BP: 143/96 (12/19 0502) Pulse Rate: 105 (12/19 0502)  Labs: Recent Labs    05/17/20 0224 05/18/20 0227 05/18/20 0853 05/18/20 1921 05/19/20 0138  HGB 11.7* 11.6* 12.6* 12.5* 12.4*  HCT 36.4* 35.3* 37.0* 38.0* 38.2*  PLT 124* 126*  --  128* 144*  LABPROT 18.6* 16.6*  --   --  16.3*  INR 1.6* 1.4*  --   --  1.4*  HEPARINUNFRC 0.58 <0.10*  --   --  0.47  CREATININE 7.06*  --  8.90*  --  6.93*    Estimated Creatinine Clearance: 14.3 mL/min (A) (by C-G formula based on SCr of 6.93 mg/dL (H)).   Medical History: Past Medical History:  Diagnosis Date  . Anemia   . Arthritis    HNP- lumbar, "all over my body"  . Blood transfusion    "years ago; blood was low" (08/05/2013)  . Diabetic nephropathy ([place])   . Diabetic retinopathy   . DVT (deep venous thrombosis) ([place])    "got one in my right leg now; I've had one before too, not sure which leg" (08/05/2013)  . ESRD (end stage renal disease) on dialysis ([place])    TTS, [place]  . Family history of anesthesia complication    " my son wakes up slowly"  . GERD (gastroesophageal reflux disease)    uses alka seltzere on occas.   [name])    "one q now and then" (08/05/2013)  . Hyperlipidemia   . Hypertension   . 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 Non Hodgkin's lymphoma ([place])    Tx 2009; "had chemo; it went away" (08/05/2013)  . Noncompliance 03/16/2012  . NSVT (nonsustained ventricular tachycardia) ([place]) 03/18/2012  . Peripheral vascular disease ([place])   . Poor historian    pt. unsure of several answers to  health history questions   . Skin cancer    melanoma - head  . Sleep apnea    "suppose to have a sleep study, but they never told me when. (08/05/2013)    Medications:  Scheduled:  . amLODipine  5 mg Oral Daily  . Chlorhexidine Gluconate Cloth  6 each Topical Q0600  . cinacalcet  30 mg Oral BID AC  . doxercalciferol  5 mcg Intravenous Q T,Th,Sa-HD  . insulin aspart  0-6 Units Subcutaneous TID WC  . iron polysaccharides  150 mg Oral Daily  . multivitamin  1 tablet Oral QHS  . pantoprazole  40 mg Oral Daily  . sevelamer carbonate  800 mg Oral TID WC    Assessment: Patient is a 69 yo male that is being admitted for Cholelithiasis, CBD dilatation. The patient is on warfarin at home for afib.  Pharmacy has been asked to dose heparin in this patient for afib while holding warfarin.  Heparin drip was previously held for cholecystectomy on 12/18, but since restarted.    INR today 1.4 and heparin level therapeutic at 0.47. CBC stable.  No issues with bleeding or line per RN.  Continue at same rate of 1650 units/hr.   Goal of Therapy:  Heparin level 0.3-0.7 units/ml Monitor  platelets by anticoagulation protocol: Yes   Plan:  Continue to hold warfarin   Continue heparin at 1650 units/hr  F/u daily HL, daily CBC, and daily INR (trending for warfarin restart at some point) F/u plans for oral anticoagulation  Dimple Nanas, PharmD PGY-1 Acute Care Pharmacy Resident Office: [phone] 05/19/2020 7:11 AM   Please check AMION for all [place] phone numbers After 10:00 PM, call [place] [phone]

## 2020-05-20 ENCOUNTER — Inpatient Hospital Stay (HOSPITAL_COMMUNITY): Payer: Medicare Other

## 2020-05-20 LAB — HEMOGLOBIN AND HEMATOCRIT, BLOOD
HCT: 26.2 % — ABNORMAL LOW (ref 39.0–52.0)
Hemoglobin: 8.7 g/dL — ABNORMAL LOW (ref 13.0–17.0)

## 2020-05-20 LAB — LACTIC ACID, PLASMA
Lactic Acid, Venous: 1.5 mmol/L (ref 0.5–1.9)
Lactic Acid, Venous: 1.5 mmol/L (ref 0.5–1.9)

## 2020-05-20 LAB — CBC
HCT: 32 % — ABNORMAL LOW (ref 39.0–52.0)
HCT: 26.7 % — ABNORMAL LOW (ref 39.0–52.0)
Hemoglobin: 10.4 g/dL — ABNORMAL LOW (ref 13.0–17.0)
Hemoglobin: 9 g/dL — ABNORMAL LOW (ref 13.0–17.0)
MCH: 30.4 pg (ref 26.0–34.0)
MCH: 31.1 pg (ref 26.0–34.0)
MCHC: 32.5 g/dL (ref 30.0–36.0)
MCHC: 33.7 g/dL (ref 30.0–36.0)
MCV: 93.6 fL (ref 80.0–100.0)
MCV: 92.4 fL (ref 80.0–100.0)
Platelets: 135 10*3/uL — ABNORMAL LOW (ref 150–400)
Platelets: 128 10*3/uL — ABNORMAL LOW (ref 150–400)
RBC: 3.42 MIL/uL — ABNORMAL LOW (ref 4.22–5.81)
RBC: 2.89 MIL/uL — ABNORMAL LOW (ref 4.22–5.81)
RDW: 15.5 % (ref 11.5–15.5)
RDW: 15.6 % — ABNORMAL HIGH (ref 11.5–15.5)
WBC: 12.6 10*3/uL — ABNORMAL HIGH (ref 4.0–10.5)
WBC: 13 10*3/uL — ABNORMAL HIGH (ref 4.0–10.5)
nRBC: 0 % (ref 0.0–0.2)
nRBC: 0 % (ref 0.0–0.2)

## 2020-05-20 LAB — HEPARIN LEVEL (UNFRACTIONATED): Heparin Unfractionated: 0.33 IU/mL (ref 0.30–0.70)

## 2020-05-20 LAB — RENAL FUNCTION PANEL
Albumin: 2.4 g/dL — ABNORMAL LOW (ref 3.5–5.0)
Anion gap: 17 — ABNORMAL HIGH (ref 5–15)
BUN: 80 mg/dL — ABNORMAL HIGH (ref 8–23)
CO2: 23 mmol/L (ref 22–32)
Calcium: 7.4 mg/dL — ABNORMAL LOW (ref 8.9–10.3)
Chloride: 92 mmol/L — ABNORMAL LOW (ref 98–111)
Creatinine, Ser: 10.01 mg/dL — ABNORMAL HIGH (ref 0.61–1.24)
GFR, Estimated: 5 mL/min — ABNORMAL LOW (ref >60)
Glucose, Bld: 154 mg/dL — ABNORMAL HIGH (ref 70–99)
Phosphorus: 4.6 mg/dL (ref 2.5–4.6)
Potassium: 4.1 mmol/L (ref 3.5–5.1)
Sodium: 132 mmol/L — ABNORMAL LOW (ref 135–145)

## 2020-05-20 LAB — GLUCOSE, CAPILLARY
Glucose-Capillary: 119 mg/dL — ABNORMAL HIGH (ref 70–99)
Glucose-Capillary: 141 mg/dL — ABNORMAL HIGH (ref 70–99)
Glucose-Capillary: 137 mg/dL — ABNORMAL HIGH (ref 70–99)

## 2020-05-20 LAB — PROTIME-INR
INR: 1.9 — ABNORMAL HIGH (ref 0.8–1.2)
Prothrombin Time: 21.4 seconds — ABNORMAL HIGH (ref 11.4–15.2)

## 2020-05-20 LAB — HEPATITIS B E ANTIBODY: Hep B E Ab: NEGATIVE

## 2020-05-20 MED ORDER — MIDODRINE HCL 5 MG PO TABS
5.0000 mg | ORAL_TABLET | Freq: Three times a day (TID) | ORAL | Status: DC
  Administered 2020-05-20: 5 mg via ORAL
  Filled 2020-05-20: qty 1

## 2020-05-20 MED ORDER — SODIUM CHLORIDE 0.9 % IV BOLUS
500.0000 mL | Freq: Once | INTRAVENOUS | Status: AC
  Administered 2020-05-20: 500 mL via INTRAVENOUS

## 2020-05-20 MED ORDER — PROSOURCE TF PO LIQD
45.0000 mL | Freq: Two times a day (BID) | ORAL | Status: DC
  Filled 2020-05-20 (×2): qty 45

## 2020-05-20 MED ORDER — PROSOURCE PLUS PO LIQD
30.0000 mL | Freq: Two times a day (BID) | ORAL | Status: DC
  Administered 2020-05-21 – 2020-05-23 (×3): 30 mL via ORAL
  Filled 2020-05-20 (×3): qty 30

## 2020-05-20 MED ORDER — SODIUM CHLORIDE 0.9 % IV BOLUS
250.0000 mL | Freq: Once | INTRAVENOUS | Status: AC
  Administered 2020-05-20: 250 mL via INTRAVENOUS

## 2020-05-20 MED ORDER — MIDODRINE HCL 5 MG PO TABS
10.0000 mg | ORAL_TABLET | Freq: Three times a day (TID) | ORAL | Status: DC
  Administered 2020-05-20 – 2020-05-24 (×11): 10 mg via ORAL
  Filled 2020-05-20 (×12): qty 2

## 2020-05-20 MED ORDER — WARFARIN SODIUM 5 MG PO TABS
5.0000 mg | ORAL_TABLET | Freq: Once | ORAL | Status: AC
  Administered 2020-05-20: 5 mg via ORAL
  Filled 2020-05-20: qty 1

## 2020-05-20 MED ORDER — MIDODRINE HCL 5 MG PO TABS
5.0000 mg | ORAL_TABLET | Freq: Once | ORAL | Status: AC
  Administered 2020-05-20: 5 mg via ORAL
  Filled 2020-05-20: qty 1

## 2020-05-20 NOTE — Progress Notes (Signed)
[place] for Heparin --> warfarin Indication: atrial fibrillation  No Known Allergies  Patient Measurements: Height: 6' 3.5" (191.8 cm) Weight: 122.7 kg (270 lb 8.1 oz) IBW/kg (Calculated) : 85.65 Heparin Dosing Weight: 111.7 kg  Vital Signs: Temp: 99.8 F (37.7 C) (12/20 0421) Temp Source: Oral (12/20 0421) BP: 112/71 (12/20 0421) Pulse Rate: 102 (12/20 0421)  Labs: Recent Labs    05/18/20 0227 05/18/20 0853 05/18/20 1921 05/19/20 0138 05/20/20 0442  HGB 11.6* 12.6* 12.5* 12.4* 10.4*  HCT 35.3* 37.0* 38.0* 38.2* 32.0*  PLT 126*  --  128* 144* 135*  LABPROT 16.6*  --   --  16.3* 21.4*  INR 1.4*  --   --  1.4* 1.9*  HEPARINUNFRC <0.10*  --   --  0.47 0.33  CREATININE  --  8.90*  --  6.93*  --     Estimated Creatinine Clearance: 14.7 mL/min (A) (by C-G formula based on SCr of 6.93 mg/dL (H)).   Medical History: Past Medical History:  Diagnosis Date  . Anemia   . Arthritis    HNP- lumbar, "all over my body"  . Blood transfusion    "years ago; blood was low" (08/05/2013)  . Diabetic nephropathy ([place])   . Diabetic retinopathy   . DVT (deep venous thrombosis) ([place])    "got one in my right leg now; I've had one before too, not sure which leg" (08/05/2013)  . ESRD (end stage renal disease) on dialysis ([place])    TTS, [place]  . Family history of anesthesia complication    " my son wakes up slowly"  . GERD (gastroesophageal reflux disease)    uses alka seltzere on occas.   [name])    "one q now and then" (08/05/2013)  . Hyperlipidemia   . Hypertension   . 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 Non Hodgkin's lymphoma ([place])    Tx 2009; "had chemo; it went away" (08/05/2013)  . Noncompliance 03/16/2012  . NSVT (nonsustained ventricular tachycardia) ([place]) 03/18/2012  . Peripheral vascular disease ([place])   . Poor historian    pt. unsure of several  answers to health history questions   . Skin cancer    melanoma - head  . Sleep apnea    "suppose to have a sleep study, but they never told me when. (08/05/2013)    Medications:  Scheduled:  . amLODipine  5 mg Oral Daily  . Chlorhexidine Gluconate Cloth  6 each Topical Q0600  . cinacalcet  30 mg Oral BID AC  . doxercalciferol  5 mcg Intravenous Q T,Th,Sa-HD  . insulin aspart  0-6 Units Subcutaneous TID WC  . iron polysaccharides  150 mg Oral Daily  . multivitamin  1 tablet Oral QHS  . pantoprazole  40 mg Oral Daily  . sevelamer carbonate  800 mg Oral TID WC  . Warfarin - Pharmacist Dosing Inpatient   Does not apply q1600    Assessment: Patient is a 69 yo male that is being admitted for Cholelithiasis, CBD dilatation. The patient is on warfarin at home for afib.  PTA dose is 5mg  daily except for 7.5mg  on Tuesdays.  Pharmacy has been asked to restart warfarin.  Heparin level therapeutic at 0.33. Hgb down some 12.4>>10.4.  No issues with bleeding or line noted.  Continue at same rate of 1650 units/hr.  INR today subtherapeutic at 1.9.  Will continue  warfarin at home dose of 5mg  tonight.  Continue heparin drip until INR therapeutic.   Goal of Therapy:  Heparin level 0.3-0.7 units/ml Monitor platelets by anticoagulation protocol: Yes  INR 2-3   Plan:  Warfarin 5mg  tonight  Continue heparin at 1650 units/hr Monitor hgb daily  F/u daily HL, daily CBC, and daily INR   Rashaun Wichert A. Levada Dy, PharmD, BCPS, [facility] Clinical Pharmacist [place] Please utilize Amion for appropriate phone number to reach the unit pharmacist ([place])   05/20/2020 7:29 AM   Please check AMION for all [place] phone numbers After 10:00 PM, call [place] [phone]

## 2020-05-20 NOTE — Progress Notes (Signed)
2 Days Post-Op  Subjective: CC: Patient reports soreness around incisions. Having some increased pain with mobilizing. Walked in halls with walker yesterday. Tolerated diet yesterday without increased abdominal pain, or assc n/v. Reports he does not urinate at baseline.   Objective: Vital signs in last 24 hours: Temp:  [98 F (36.7 C)-99.8 F (37.7 C)] 99.8 F (37.7 C) (12/20 0421) Pulse Rate:  [86-102] 102 (12/20 0421) Resp:  [16-18] 16 (12/20 0421) BP: (89-112)/(59-71) 112/71 (12/20 0421) SpO2:  [90 %-98 %] 92 % (12/20 0421) Weight:  [122.7 kg] 122.7 kg (12/19 2110) Last BM Date: 05/19/20  Intake/Output from previous day: 12/19 0701 - 12/20 0700 In: 918 [P.O.:720; I.V.:198] Out: 0  Intake/Output this shift: Total I/O In: 120 [P.O.:120] Out: -   PE: Gen:  Alert, NAD, pleasant Lungs: Normal rate and effort.  Heart: IRR Abd: Soft, protuberant, appropriately tender around laparoscopic incisions with some tenderness of the RUQ without peritonitis, +BS, Incisions with glue intact appears well and are without drainage, bleeding, or signs of infection Ext:  No calf tenderness b/l Skin: no rashes noted, warm and dry  Lab Results:  Recent Labs    05/19/20 0138 05/20/20 0442  WBC 11.7* 12.6*  HGB 12.4* 10.4*  HCT 38.2* 32.0*  PLT 144* 135*   BMET Recent Labs    05/18/20 0853 05/19/20 0138  NA 137 138  K 4.1 4.6  CL 98 96*  CO2  --  24  GLUCOSE 111* 141*  BUN 43* 25*  CREATININE 8.90* 6.93*  CALCIUM  --  8.3*   PT/INR Recent Labs    05/19/20 0138 05/20/20 0442  LABPROT 16.3* 21.4*  INR 1.4* 1.9*   CMP     Component Value Date/Time   NA 138 05/19/2020 0138   NA 142 09/04/2016 1628   K 4.6 05/19/2020 0138   CL 96 (L) 05/19/2020 0138   CO2 24 05/19/2020 0138   GLUCOSE 141 (H) 05/19/2020 0138   BUN 25 (H) 05/19/2020 0138   BUN 22 09/04/2016 1628   CREATININE 6.93 (H) 05/19/2020 0138   CALCIUM 8.3 (L) 05/19/2020 0138   CALCIUM 9.1 01/09/2013 0903    PROT 7.1 05/19/2020 0138   ALBUMIN 3.2 (L) 05/19/2020 0138   AST 47 (H) 05/19/2020 0138   ALT 41 05/19/2020 0138   ALKPHOS 146 (H) 05/19/2020 0138   BILITOT 1.1 05/19/2020 0138   GFRNONAA 8 (L) 05/19/2020 0138   GFRAA 16 (L) 07/16/2018 2320   Lipase     Component Value Date/Time   LIPASE 43 05/13/2020 1017       Studies/Results: DG Cholangiogram Operative  Result Date: 05/18/2020 CLINICAL DATA:  Cholecystectomy for cholelithiasis and choledocholithiasis. EXAM: INTRAOPERATIVE CHOLANGIOGRAM TECHNIQUE: Cholangiographic images from the C-arm fluoroscopic device were submitted for interpretation post-operatively. Please see the procedural report for the amount of contrast and the fluoroscopy time utilized. COMPARISON:  Ultrasound and CT studies on 05/13/2020 FINDINGS: Intraoperative imaging with a C-arm demonstrates a dilated common bile duct without discrete filling defects. Contrast enters the duodenum. IVC filter present. IMPRESSION: Dilated common bile duct without discrete filling defects. Electronically Signed   By: Aletta Edouard [name]   On: 05/18/2020 13:45    Anti-infectives: Anti-infectives (From admission, onward)   Start     Dose/Rate Route Frequency Ordered Stop   05/17/20 1715  ceFAZolin (ANCEF) IVPB 2g/100 mL premix  Status:  Discontinued        2 g 200 mL/hr over 30 Minutes Intravenous On call  to O.R. 05/17/20 1557 05/17/20 1708       Assessment/Plan ESRD on HD T/Th/Sat IDDM HLD PVD H/o DVT with filter on Coumadin Hx Non-Hodgkin's lymphoma 2009 s/p chemotherapy Non-ischemic cardiomyopathy with HF (improvd EF 50-55% on ECHO 04/2019) Atrial fibrillation H/o pericardial effusion s/p pericardial window 2019 ABL Anemia   Choledocholithiasis -CT from12/13confirmed choledocholithiasis - s/p ERCP with ductal clearance 12/17 - s/p laparoscopic cholecystectomy 83/25 without complication - Continue PT order (did not work with them post op) and mobilizing -  Pulm toilet - Heparin to coumadin per primary team. Soft BP overnight with improvement this AM. Hgb 12.4>10.4. When stable, okay for d/c from our standpoint. We will arrange follow up  ID - none FEN - renal Foley - none VTE - heparin gtt >> warfarin   Follow up - DOW  Plan:  Appropriate for discharge from surgical perspective   LOS: 7 days    Jillyn Ledger , [facility] Surgery 05/20/2020, 8:44 AM Please see Amion for pager number during day hours 7:00am-4:30pm

## 2020-05-20 NOTE — Progress Notes (Addendum)
PROGRESS NOTE    ARLIND KLINGERMAN  ESP:[id number] DOB: 09-Oct-1950 DOA: 05/13/2020 PCP: Burnard Bunting, MD   Brief Narrative: 69 year old with past medical history significant for peripheral vascular disease a status post right transmetatarsal amputation, and non Hodgkin lymphoma, type 2 diabetes mellitus, hypertension, hyperlipidemia, chronic systolic congestive heart failure, A. fib on Coumadin, history of pericardial tamponade status post window, OSA not on CPAP, ESRD on hemodialysis TTS who presented from general surgery office with persistent abdominal pain over the past 2 or 3 weeks.  Patient report pain as intermittent, not worse after eating.  Patient denies any fever chills night sweats, nausea vomiting.  Evaluation in the ED vitals are stable, potassium 4.3, chloride 96, CO2 28, glucose 130, BUN 52, creatinine 9.6, glucose 130, lipase 43.  Bilirubin 0.8.  White blood cell 5.8, hemoglobin 11.6, platelets 154.  COVID-19 influenza negative.  Right upper quadrant ultrasound with large solitary gallstone without evidence of cholecystitis, CBD 10 mm consistent with mild dilation, increase echogenicity hepatic parenchyma, possible right nephrolithiasis.  GI, general surgery were consulted.  Hospitalist service consulted for admission and further evaluation and management.   Assessment & Plan:   Principal Problem:   Cholelithiasis without obstruction Active Problems:   Hyperlipidemia   ESRD (end stage renal disease) (HCC)   Systolic heart failure (HCC)   Hypotension   Class 1 obesity due to excess calories with body mass index (BMI) of 33.0 to 33.9 in adult   Choledocholithiasis  1-Choledocholithiasis: -Patient presented to the ED from general surgery office with persistent abdominal pain over the last 2 or 3 weeks. Patient was found to have CBD ductal dilation with a gallstone within the gallbladder on  right upper quadrant ultrasound. -CT renal was done.if with choledocholithiasis with  a small stone distal common bile duct measuring up to 8 mm with biliary ductal dilation, mild gallbladder distention and intraluminal gallstone with pericholecystic inflammation -[place], GI and general surgery following. -Underwent ERCP 12/17: Gastritis, status post biopsy, the major papilla appeared to be prominent edematous, filling defect consistent with a stone and sludge were seen on the cholangiogram.  The entire main bile duct was severely dilated.  Choledocholithiasis was found.  Complete removal was accomplished by biliary sphincterotomy and balloon drugs. -Underwent cholecystectomy on 05/18/2020 -He has been tolerating diet.   2-Chronic Systolic Congestive Heart Failure, nonischemic cardiomyopathy, essential hypertension History of left ventricular ejection fraction 30 to 35% with improvement to 50 to 55% in 2020. Patient was evaluated by cardiology on 05/13/2020 for preoperative evaluation.  No indication for further testing at this time. Not on ACE or ARB or beta-blocker at home due to history of hypotension.  3-Chronic Hypotension; recurrent Hypotension  Midodrine was on hold because he was having  elevated BP.  Now again with Hypotension. Gave IV bolus. Lactic acid normal.  Resume midodrine.  Addendum;  Hypotension persist.  IV bolus.  Hb down to 9.  Type and screen Check for retroperitoneal hematoma with CT abdomen pelvis.  CT abdomen only showed seroma, will hold heparin overnight, his INR is 1.9 close to be therapeutic.  Continue to monitor hg overnight.   4-Paroxysmal A. fib/flutter: On anticoagulation Coumadin for held prior to  ERCP and cholecystectomy.  Heparin drip to be managed by pharmacy On heparin Gtt/ and coumadin   5-ESRD on hemodialysis: Continue with dialysis TTS. Appreciate nephrology follow-up  History of non-Hodgkin lymphoma status post chemo in remission, history of sarcomatoid tumor scalp: Follow-up with oncology as an outpatient.  HTN; started  on BP medications this admission. Monitor.  Hold Norvasc , he has now Hypotension.    Estimated body mass index is 33.36 kg/m as calculated from the following:   Height as of this encounter: 6' 3.5" (1.918 m).   Weight as of this encounter: 122.7 kg.   DVT prophylaxis: scd Code Status: Full Code Family Communication: care discussed with  Disposition Plan:  Status is: Inpatient  Remains inpatient appropriate because:Ongoing active pain requiring inpatient pain management and IV treatments appropriate due to intensity of illness or inability to take PO   Dispo:  Patient From: Home  Planned Disposition: Home  Expected discharge date: 05/18/2020  Medically stable for discharge: No, plan for ERCP on 12/17         Consultants:   GI  Nephrology  Procedures:   None  Antimicrobials:    Subjective: He is complaining of abdominal pain.  He denies nausea.   Objective: Vitals:   05/20/20 0904 05/20/20 0958 05/20/20 1017 05/20/20 1120  BP: (!) 99/57  (!) 93/53 (!) 84/50  Pulse: (!) 104  (!) 109 (!) 105  Resp: 20 18 20 18   Temp: 98.7 F (37.1 C)  99.1 F (37.3 C) 99.3 F (37.4 C)  TempSrc: Oral  Oral Oral  SpO2: 91%  94% 91%  Weight:      Height:        Intake/Output Summary (Last 24 hours) at 05/20/2020 1254 Last data filed at 05/20/2020 0757 Gross per 24 hour  Intake 797.96 ml  Output 0 ml  Net 797.96 ml   Filed Weights   05/18/20 1746 05/18/20 2119 05/19/20 2110  Weight: 115.8 kg 115.8 kg 122.7 kg    Examination:  General exam: NAD Respiratory system: CTA Cardiovascular system: S 1, S 2  RRR Gastrointestinal system: BS present, soft, nt Central nervous system: Alert, following command Extremities: no edema  Data Reviewed: I have personally reviewed following labs and imaging studies  CBC: Recent Labs  Lab 05/17/20 0224 05/18/20 0227 05/18/20 0853 05/18/20 1921 05/19/20 0138 05/20/20 0442  WBC 4.4 5.2  --  15.4* 11.7* 12.6*  HGB  11.7* 11.6* 12.6* 12.5* 12.4* 10.4*  HCT 36.4* 35.3* 37.0* 38.0* 38.2* 32.0*  MCV 93.1 93.9  --  93.6 93.6 93.6  PLT 124* 126*  --  128* 144* 779*   Basic Metabolic Panel: Recent Labs  Lab 05/15/20 0151 05/16/20 1048 05/17/20 0224 05/18/20 0853 05/19/20 0138 05/20/20 1134  NA 140 136 136 137 138 132*  K 4.6 4.1 4.0 4.1 4.6 4.1  CL 98 94* 98 98 96* 92*  CO2 23 25 23   --  24 23  GLUCOSE 133* 111* 86 111* 141* 154*  BUN 70* 37* 26* 43* 25* 80*  CREATININE 12.66* 9.14* 7.06* 8.90* 6.93* 10.01*  CALCIUM 7.9* 8.1* 8.1*  --  8.3* 7.4*  PHOS  --  4.8*  --   --   --  4.6   GFR: Estimated Creatinine Clearance: 10.2 mL/min (A) (by C-G formula based on SCr of 10.01 mg/dL (H)). Liver Function Tests: Recent Labs  Lab 05/14/20 0429 05/15/20 0151 05/16/20 1048 05/17/20 0224 05/19/20 0138 05/20/20 1134  AST 28 26  --  26 47*  --   ALT 26 30  --  32 41  --   ALKPHOS 134* 141*  --  139* 146*  --   BILITOT 1.3* 1.2  --  1.3* 1.1  --   PROT 7.0 6.4*  --  6.9 7.1  --  ALBUMIN 3.3* 3.0* 3.1* 3.1* 3.2* 2.4*   No results for input(s): LIPASE, AMYLASE in the last 168 hours. No results for input(s): AMMONIA in the last 168 hours. Coagulation Profile: Recent Labs  Lab 05/16/20 0323 05/17/20 0224 05/18/20 0227 05/19/20 0138 05/20/20 0442  INR 1.9* 1.6* 1.4* 1.4* 1.9*   Cardiac Enzymes: No results for input(s): CKTOTAL, CKMB, CKMBINDEX, TROPONINI in the last 168 hours. BNP (last 3 results) No results for input(s): PROBNP in the last 8760 hours. HbA1C: No results for input(s): HGBA1C in the last 72 hours. CBG: Recent Labs  Lab 05/19/20 1134 05/19/20 1606 05/19/20 2109 05/20/20 0703 05/20/20 1119  GLUCAP 155* 188* 139* 119* 141*   Lipid Profile: No results for input(s): CHOL, HDL, LDLCALC, TRIG, CHOLHDL, LDLDIRECT in the last 72 hours. Thyroid Function Tests: No results for input(s): TSH, T4TOTAL, FREET4, T3FREE, THYROIDAB in the last 72 hours. Anemia Panel: No results for  input(s): VITAMINB12, FOLATE, FERRITIN, TIBC, IRON, RETICCTPCT in the last 72 hours. Sepsis Labs: Recent Labs  Lab 05/20/20 1134  LATICACIDVEN 1.5    Recent Results (from the past 240 hour(s))  Resp Panel by RT-PCR (Flu A&B, Covid) Nasopharyngeal Swab     Status: None   Collection Time: 05/13/20  4:07 PM   Specimen: Nasopharyngeal Swab; Nasopharyngeal(NP) swabs in vial transport medium  Result Value Ref Range Status   SARS Coronavirus 2 by RT PCR NEGATIVE NEGATIVE Final    Comment: (NOTE) SARS-CoV-2 target nucleic acids are NOT DETECTED.  The SARS-CoV-2 RNA is generally detectable in upper respiratory specimens during the acute phase of infection. The lowest concentration of SARS-CoV-2 viral copies this assay can detect is 138 copies/mL. A negative result does not preclude SARS-Cov-2 infection and should not be used as the sole basis for treatment or other patient management decisions. A negative result may occur with  improper specimen collection/handling, submission of specimen other than nasopharyngeal swab, presence of viral mutation(s) within the areas targeted by this assay, and inadequate number of viral copies(<138 copies/mL). A negative result must be combined with clinical observations, patient history, and epidemiological information. The expected result is Negative.  Fact Sheet for Patients:  EntrepreneurPulse.com.au  Fact Sheet for Healthcare Providers:  IncredibleEmployment.be  This test is no t yet approved or cleared by the [place] FDA and  has been authorized for detection and/or diagnosis of SARS-CoV-2 by FDA under an Emergency Use Authorization (EUA). This EUA will remain  in effect (meaning this test can be used) for the duration of the COVID-19 declaration under Section 564(b)(1) of the Act, 21 U.S.C.section 360bbb-3(b)(1), unless the authorization is terminated  or revoked sooner.       Influenza A by PCR  NEGATIVE NEGATIVE Final   Influenza B by PCR NEGATIVE NEGATIVE Final    Comment: (NOTE) The Xpert Xpress SARS-CoV-2/FLU/RSV plus assay is intended as an aid in the diagnosis of influenza from Nasopharyngeal swab specimens and should not be used as a sole basis for treatment. Nasal washings and aspirates are unacceptable for Xpert Xpress SARS-CoV-2/FLU/RSV testing.  Fact Sheet for Patients: EntrepreneurPulse.com.au  Fact Sheet for Healthcare Providers: IncredibleEmployment.be  This test is not yet approved or cleared by the [place] FDA and has been authorized for detection and/or diagnosis of SARS-CoV-2 by FDA under an Emergency Use Authorization (EUA). This EUA will remain in effect (meaning this test can be used) for the duration of the COVID-19 declaration under Section 564(b)(1) of the Act, 21 U.S.C. section 360bbb-3(b)(1), unless the authorization is  terminated or revoked.  Performed at [facility], [place] [address] [place], [place] [address]   MRSA PCR Screening     Status: None   Collection Time: 05/14/20  3:18 PM   Specimen: Nasopharyngeal  Result Value Ref Range Status   MRSA by PCR NEGATIVE NEGATIVE Final    Comment:        The GeneXpert MRSA Assay (FDA approved for NASAL specimens only), is one component of a comprehensive MRSA colonization surveillance program. It is not intended to diagnose MRSA infection nor to guide or monitor treatment for MRSA infections. Performed at [facility], [place] [address], [place], [place] [address]   Surgical pcr screen     Status: None   Collection Time: 05/18/20  2:11 AM   Specimen: Nasal Mucosa; Nasal Swab  Result Value Ref Range Status   MRSA, PCR NEGATIVE NEGATIVE Final   Staphylococcus aureus NEGATIVE NEGATIVE Final    Comment: (NOTE) The Xpert SA Assay (FDA approved for NASAL specimens in patients 28 years of age and older), is one component of a  comprehensive surveillance program. It is not intended to diagnose infection nor to guide or monitor treatment. Performed at [facility], [place] [address], [place],  [address]          Radiology Studies: No results found.      Scheduled Meds: . Chlorhexidine Gluconate Cloth  6 each Topical Q0600  . cinacalcet  30 mg Oral BID AC  . doxercalciferol  5 mcg Intravenous Q T,Th,Sa-HD  . insulin aspart  0-6 Units Subcutaneous TID WC  . iron polysaccharides  150 mg Oral Daily  . midodrine  5 mg Oral TID WC  . multivitamin  1 tablet Oral QHS  . pantoprazole  40 mg Oral Daily  . sevelamer carbonate  800 mg Oral TID WC  . warfarin  5 mg Oral ONCE-1600  . Warfarin - Pharmacist Dosing Inpatient   Does not apply q1600   Continuous Infusions: . heparin 1,650 Units/hr (05/20/20 1004)     LOS: 7 days    Time spent: 35 minutes.     Elmarie Shiley, MD [facility]   If 7PM-7AM, please contact night-coverage [email or web address]  05/20/2020, 12:54 PM

## 2020-05-20 NOTE — Plan of Care (Signed)
  Problem: Education: Goal: Knowledge of General Education information will improve Description: Including pain rating scale, medication(s)/side effects and non-pharmacologic comfort measures Outcome: Progressing   Problem: Health Behavior/Discharge Planning: Goal: Ability to manage health-related needs will improve Outcome: Progressing   Problem: Nutrition: Goal: Adequate nutrition will be maintained Outcome: Progressing   Problem: Fluid Volume: Goal: Compliance with measures to maintain balanced fluid volume will improve Outcome: Progressing

## 2020-05-20 NOTE — Progress Notes (Addendum)
[facility] Progress Note   Subjective: Seen in room, no C/Os. SBP low in 80-90s. Says he feels fine.      Objective Vitals:   05/20/20 0904 05/20/20 0958 05/20/20 1017 05/20/20 1120  BP: (!) 99/57  (!) 93/53 (!) 84/50  Pulse: (!) 104  (!) 109 (!) 105  Resp: 20 18 20 18   Temp: 98.7 F (37.1 C)  99.1 F (37.3 C) 99.3 F (37.4 C)  TempSrc: Oral  Oral Oral  SpO2: 91%  94% 91%  Weight:      Height:       Physical Exam General: WN, WD older male in NAD Heart: S1,S2 no M/R/G Lungs: CTAB Abdomen: Obese, tenderness around lap chole incisions. Active BS Extremities: No LE edema.  Dialysis Access: R AVF + T/B  Additional Objective Labs: Basic Metabolic Panel: Recent Labs  Lab 05/16/20 1048 05/17/20 0224 05/18/20 0853 05/19/20 0138 05/20/20 1134  NA 136 136 137 138 132*  K 4.1 4.0 4.1 4.6 4.1  CL 94* 98 98 96* 92*  CO2 25 23  --  24 23  GLUCOSE 111* 86 111* 141* 154*  BUN 37* 26* 43* 25* 80*  CREATININE 9.14* 7.06* 8.90* 6.93* 10.01*  CALCIUM 8.1* 8.1*  --  8.3* 7.4*  PHOS 4.8*  --   --   --  4.6   Liver Function Tests: Recent Labs  Lab 05/15/20 0151 05/16/20 1048 05/17/20 0224 05/19/20 0138 05/20/20 1134  AST 26  --  26 47*  --   ALT 30  --  32 41  --   ALKPHOS 141*  --  139* 146*  --   BILITOT 1.2  --  1.3* 1.1  --   PROT 6.4*  --  6.9 7.1  --   ALBUMIN 3.0*   < > 3.1* 3.2* 2.4*   < > = values in this interval not displayed.   No results for input(s): LIPASE, AMYLASE in the last 168 hours. CBC: Recent Labs  Lab 05/17/20 0224 05/18/20 0227 05/18/20 0853 05/18/20 1921 05/19/20 0138 05/20/20 0442  WBC 4.4 5.2  --  15.4* 11.7* 12.6*  HGB 11.7* 11.6*   < > 12.5* 12.4* 10.4*  HCT 36.4* 35.3*   < > 38.0* 38.2* 32.0*  MCV 93.1 93.9  --  93.6 93.6 93.6  PLT 124* 126*  --  128* 144* 135*   < > = values in this interval not displayed.   Blood Culture    Component Value Date/Time   SDES FLUID PERICARDIAL 01/10/2018 1015   SPECREQUEST B  01/10/2018 1015   CULT  01/10/2018 1015    NO GROWTH 3 DAYS Performed at [facility], [place] [address] [place], [place] [address]    REPTSTATUS 01/13/2018 FINAL 01/10/2018 1015    Cardiac Enzymes: No results for input(s): CKTOTAL, CKMB, CKMBINDEX, TROPONINI in the last 168 hours. CBG: Recent Labs  Lab 05/19/20 1134 05/19/20 1606 05/19/20 2109 05/20/20 0703 05/20/20 1119  GLUCAP 155* 188* 139* 119* 141*   Iron Studies: No results for input(s): IRON, TIBC, TRANSFERRIN, FERRITIN in the last 72 hours. @lablastinr3 @ Studies/Results: No results found. Medications: . heparin 1,650 Units/hr (05/20/20 1004)   . Chlorhexidine Gluconate Cloth  6 each Topical Q0600  . cinacalcet  30 mg Oral BID AC  . doxercalciferol  5 mcg Intravenous Q T,Th,Sa-HD  . insulin aspart  0-6 Units Subcutaneous TID WC  . iron polysaccharides  150 mg Oral Daily  . midodrine  5  mg Oral TID WC  . multivitamin  1 tablet Oral QHS  . pantoprazole  40 mg Oral Daily  . sevelamer carbonate  800 mg Oral TID WC  . warfarin  5 mg Oral ONCE-1600  . Warfarin - Pharmacist Dosing Inpatient   Does not apply q1600     Dialysis Orders: [facility] on TTS  4:15 hr 180NRe 450/800 119.5 kg,2K, 2.25 Ca UFP 4  AVF No heparin Hectorol 9 mcg IV TIW last PTH 252, 245(decrease to 5 mcgs this hospitalization)  Assessment/Plan: 1. ESRD -HD TTS. HD yesterday tolerated well.  K 4.6, next HD 12/21. 2. Choledocholithiasis- ERCPcompleted 12/17and cholecystectomy 12/18.  Pain well controlled. Cleared for d/c from surgery standpoint.  3. Hypotension/volume-issues with hypotension today. SBP 80-90s. Has gotten IVF bolus without improvement. DC amlodipine. Increase midodrine to 10 mg PO TID. No evidence of volume excess by exam. Very much under OP EDW. Lower EDW on DC. Minimal UF as tolerated tomorrow  4. Anemia -Hgb12.4 today,No indication for ESA.Follow labs. 5. Metabolic bone disease -C Ca 8.7. PO4 at goal. Continue  renvela and sensipar.  6. DM type II-Per primary 7. Nutrition -Albumin low. Add protein supps, renal vit.  8.   A fib - Warfarin has been resumed post op.  Gerldine Suleiman H. Dystany Duffy NP-C 05/20/2020, 12:57 PM  [employer] [phone]

## 2020-05-20 NOTE — Consult Note (Signed)
   [facility] Inpatient Consult   05/20/2020  AUGUST LONGEST [birth year] [id number]  [facility] [ACO] Patient: Medicare NextGen   Patient screened for high risk score for unplanned readmission risk and for length of stay hospitalization to check for potential [facility] Management needs for post hospital care.  Review of patient's medical record reveals patient is currently not medically ready for transition, MD at bedside.   Primary Care Provider is Burnard Bunting, MD this provider is listed to provide the transition of care [TOC] for post hospital follow up.   Plan: Continue to follow progress and disposition to assess for post hospital care management needs.    Please place a [facility] Care Management consult as appropriate and for questions contact:   Natividad Brood, RN BSN [facility] Liaison  [phone] business mobile phone Toll free office [phone]  Fax number: [phone] [email or web address]

## 2020-05-20 NOTE — Anesthesia Postprocedure Evaluation (Signed)
Anesthesia Post Note  Patient: [name]  Procedure(s) Performed: ENDOSCOPIC RETROGRADE CHOLANGIOPANCREATOGRAPHY (ERCP) WITH PROPOFOL (N/A ) BIOPSY SPHINCTEROTOMY REMOVAL OF STONES     Patient location during evaluation: PACU Anesthesia Type: General Level of consciousness: awake and alert Pain management: pain level controlled Vital Signs Assessment: post-procedure vital signs reviewed and stable Respiratory status: spontaneous breathing, nonlabored ventilation and respiratory function stable Cardiovascular status: blood pressure returned to baseline and stable Postop Assessment: no apparent nausea or vomiting Anesthetic complications: no   No complications documented.  Last Vitals:  Vitals:   05/19/20 2110 05/20/20 0421  BP: 102/63 112/71  Pulse: 98 (!) 102  Resp: 18 16  Temp: 36.7 C 37.7 C  SpO2: 91% 92%    Last Pain:  Vitals:   05/20/20 0527  TempSrc:   PainSc: Asleep                 Lynda Rainwater

## 2020-05-21 ENCOUNTER — Encounter: Payer: Self-pay | Admitting: Gastroenterology

## 2020-05-21 ENCOUNTER — Other Ambulatory Visit: Payer: Self-pay | Admitting: Physician Assistant

## 2020-05-21 DIAGNOSIS — K922 Gastrointestinal hemorrhage, unspecified: Secondary | ICD-10-CM

## 2020-05-21 DIAGNOSIS — D500 Iron deficiency anemia secondary to blood loss (chronic): Secondary | ICD-10-CM

## 2020-05-21 LAB — RENAL FUNCTION PANEL
Albumin: 2.5 g/dL — ABNORMAL LOW (ref 3.5–5.0)
Anion gap: 18 — ABNORMAL HIGH (ref 5–15)
BUN: 102 mg/dL — ABNORMAL HIGH (ref 8–23)
CO2: 19 mmol/L — ABNORMAL LOW (ref 22–32)
Calcium: 7.6 mg/dL — ABNORMAL LOW (ref 8.9–10.3)
Chloride: 93 mmol/L — ABNORMAL LOW (ref 98–111)
Creatinine, Ser: 11.84 mg/dL — ABNORMAL HIGH (ref 0.61–1.24)
GFR, Estimated: 4 mL/min — ABNORMAL LOW (ref >60)
Glucose, Bld: 96 mg/dL (ref 70–99)
Phosphorus: 4.6 mg/dL (ref 2.5–4.6)
Potassium: 4.2 mmol/L (ref 3.5–5.1)
Sodium: 130 mmol/L — ABNORMAL LOW (ref 135–145)

## 2020-05-21 LAB — SURGICAL PATHOLOGY

## 2020-05-21 LAB — CBC
HCT: 23.5 % — ABNORMAL LOW (ref 39.0–52.0)
Hemoglobin: 8.3 g/dL — ABNORMAL LOW (ref 13.0–17.0)
MCH: 31.9 pg (ref 26.0–34.0)
MCHC: 35.3 g/dL (ref 30.0–36.0)
MCV: 90.4 fL (ref 80.0–100.0)
Platelets: 89 10*3/uL — ABNORMAL LOW (ref 150–400)
RBC: 2.6 MIL/uL — ABNORMAL LOW (ref 4.22–5.81)
RDW: 15.7 % — ABNORMAL HIGH (ref 11.5–15.5)
WBC: 11.7 10*3/uL — ABNORMAL HIGH (ref 4.0–10.5)
nRBC: 0 % (ref 0.0–0.2)

## 2020-05-21 LAB — PROTIME-INR
INR: 2.7 — ABNORMAL HIGH (ref 0.8–1.2)
Prothrombin Time: 27.9 seconds — ABNORMAL HIGH (ref 11.4–15.2)

## 2020-05-21 LAB — GLUCOSE, CAPILLARY
Glucose-Capillary: 103 mg/dL — ABNORMAL HIGH (ref 70–99)
Glucose-Capillary: 79 mg/dL (ref 70–99)
Glucose-Capillary: 154 mg/dL — ABNORMAL HIGH (ref 70–99)
Glucose-Capillary: 147 mg/dL — ABNORMAL HIGH (ref 70–99)

## 2020-05-21 LAB — PREPARE RBC (CROSSMATCH)

## 2020-05-21 LAB — HEPARIN LEVEL (UNFRACTIONATED): Heparin Unfractionated: <0.1 IU/mL — ABNORMAL LOW (ref 0.30–0.70)

## 2020-05-21 MED ORDER — LIDOCAINE-PRILOCAINE 2.5-2.5 % EX CREA: 1.0000 "application " | TOPICAL_CREAM | CUTANEOUS | Status: DC | PRN

## 2020-05-21 MED ORDER — DOXERCALCIFEROL 4 MCG/2ML IV SOLN
INTRAVENOUS | Status: AC
  Filled 2020-05-21: qty 4

## 2020-05-21 MED ORDER — SODIUM CHLORIDE 0.9% IV SOLUTION: Freq: Once | INTRAVENOUS | Status: DC

## 2020-05-21 MED ORDER — PROSOURCE TF PO LIQD
45.0000 mL | Freq: Two times a day (BID) | ORAL | Status: DC
  Filled 2020-05-21 (×5): qty 45

## 2020-05-21 MED ORDER — SODIUM CHLORIDE 0.9 % IV SOLN: 100.0000 mL | INTRAVENOUS | Status: DC | PRN

## 2020-05-21 MED ORDER — DOXERCALCIFEROL 4 MCG/2ML IV SOLN
INTRAVENOUS | Status: AC
  Administered 2020-05-21: 5 ug via INTRAVENOUS
  Filled 2020-05-21: qty 4

## 2020-05-21 MED ORDER — CINACALCET HCL 30 MG PO TABS
30.0000 mg | ORAL_TABLET | Freq: Two times a day (BID) | ORAL | Status: DC
  Administered 2020-05-23 – 2020-05-24 (×3): 30 mg via ORAL
  Filled 2020-05-21 (×4): qty 1

## 2020-05-21 MED ORDER — MIDODRINE HCL 5 MG PO TABS: 10.0000 mg | ORAL_TABLET | Freq: Once | ORAL | Status: AC

## 2020-05-21 MED ORDER — PENTAFLUOROPROP-TETRAFLUOROETH EX AERO: 1.0000 "application " | INHALATION_SPRAY | CUTANEOUS | Status: DC | PRN

## 2020-05-21 MED ORDER — LIDOCAINE HCL (PF) 1 % IJ SOLN
5.0000 mL | INTRAMUSCULAR | Status: DC | PRN
Start: 2020-05-21 — End: 2020-05-21

## 2020-05-21 MED ORDER — POLYSACCHARIDE IRON COMPLEX 150 MG PO CAPS
150.0000 mg | ORAL_CAPSULE | Freq: Every day | ORAL | Status: DC
  Administered 2020-05-23: 150 mg via ORAL
  Filled 2020-05-21 (×2): qty 1

## 2020-05-21 MED ORDER — PANTOPRAZOLE SODIUM 40 MG IV SOLR
40.0000 mg | Freq: Two times a day (BID) | INTRAVENOUS | Status: DC
  Administered 2020-05-21 – 2020-05-24 (×7): 40 mg via INTRAVENOUS
  Filled 2020-05-21 (×7): qty 40

## 2020-05-21 MED ORDER — MIDODRINE HCL 5 MG PO TABS
ORAL_TABLET | ORAL | Status: AC
  Administered 2020-05-21: 10 mg via ORAL
  Filled 2020-05-21: qty 2

## 2020-05-21 MED ORDER — PHYTONADIONE 5 MG PO TABS
10.0000 mg | ORAL_TABLET | Freq: Once | ORAL | Status: AC
  Administered 2020-05-21: 10 mg via ORAL
  Filled 2020-05-21: qty 2

## 2020-05-21 MED ORDER — CINACALCET HCL 30 MG PO TABS: 60.0000 mg | ORAL_TABLET | Freq: Two times a day (BID) | ORAL | Status: DC

## 2020-05-21 NOTE — Progress Notes (Signed)
Daily Rounding Note  05/21/2020, 11:07 AM  LOS: 8 days   SUBJECTIVE:   Chief complaint:   New onset GI bleeding  See original GI consult performed during this admission on 05/14/2020.  Admitted over 1 week ago with abdominal pain in the setting of cholelithiasis, choledocholithiasis.  Mild elevation of LFTs.  ERCP delayed due to patient being on Coumadin with presenting INR 2.3.  05/17/2020 ERCP. Patchy, moderate gastritis, biopsied.  Prominent, bulging, edematous enlarged major papilla with displacement of pancreatic duct versus small fistula at the upper portion of ampulla that was draining bile.  Filling defects on cholangiogram.  Severe dilation of main bile duct.  Stones/sludge removed following biliary sphincterotomy and balloon sweep.  Short lived oozing of blood at sphincterotomy site resolved on its own.   05/18/20 Lap chole.  Noted dilated and chronically inflammed GB, no filling defect at [facility].   Warfarin was resumed on 12/19, last dose was 12/20.  No interim heparin. INR this morning 2.7.  Was as low as 1.4 on the 18th and 19th. Hgb has gone from 12.4 to 9 to 8.3 over the last 3 days. Progressive thrombocytopenia 144 >> 89  PO pt report he had a dark stool on Sunday but no stools since then.  Today is Tuesday has postoperative pain on the right abdomen.  No nausea, vomiting.    OBJECTIVE:         Vital signs in last 24 hours:    Temp:  [97.8 F (36.6 C)-99.6 F (37.6 C)] 97.8 F (36.6 C) (12/21 1030) Pulse Rate:  [60-109] 96 (12/21 0455) Resp:  [15-26] 19 (12/21 1045) BP: (84-134)/(50-75) 94/53 (12/21 1045) SpO2:  [91 %-98 %] 93 % (12/21 1032) Weight:  [117.9 kg] 117.9 kg (12/20 2125) Last BM Date: 05/21/20 Filed Weights   05/18/20 2119 05/19/20 2110 05/20/20 2125  Weight: 115.8 kg 122.7 kg 117.9 kg   General: Looks well, comfortable.  Currently undergoing hemodialysis Heart: RRR.  No MRG. Chest: Clear  bilaterally.  No labored breathing, no cough Abdomen: Tenderness over on the right side.  Glued surgical scars CDI.  No distention.  Active bowel sounds Extremities: No CCE.  Right forefoot amputation Neuro/Psych: Pleasant, calm.  Oriented x3.  Fluid speech.  Seems a reliable historian.  Intake/Output from previous day: 12/20 0701 - 12/21 0700 In: 180 [P.O.:180] Out: 0   Intake/Output this shift: Total I/O In: 315 [Blood:315] Out: -   Lab Results: Recent Labs    05/20/20 0442 05/20/20 1431 05/20/20 1934 05/21/20 0345  WBC 12.6* 13.0*  --  11.7*  HGB 10.4* 9.0* 8.7* 8.3*  HCT 32.0* 26.7* 26.2* 23.5*  PLT 135* 128*  --  89*   BMET Recent Labs    05/19/20 0138 05/20/20 1134 05/21/20 0901  NA 138 132* 130*  K 4.6 4.1 4.2  CL 96* 92* 93*  CO2 24 23 19*  GLUCOSE 141* 154* 96  BUN 25* 80* 102*  CREATININE 6.93* 10.01* 11.84*  CALCIUM 8.3* 7.4* 7.6*   LFT Recent Labs    05/19/20 0138 05/20/20 1134 05/21/20 0901  PROT 7.1  --   --   ALBUMIN 3.2* 2.4* 2.5*  AST 47*  --   --   ALT 41  --   --   ALKPHOS 146*  --   --   BILITOT 1.1  --   --    PT/INR Recent Labs    05/20/20 0442 05/21/20 0530  LABPROT 21.4* 27.9*  INR 1.9* 2.7*   Hepatitis Panel Recent Labs    05/18/20 1501  HEPBSAG NON REACTIVE  HEPBIGM NON REACTIVE    Studies/Results: CT ABDOMEN PELVIS WO CONTRAST  Result Date: 05/20/2020 CLINICAL DATA:  69 year old male with concern for retroperitoneal hematoma. EXAM: CT ABDOMEN AND PELVIS WITHOUT CONTRAST TECHNIQUE: Multidetector CT imaging of the abdomen and pelvis was performed following the standard protocol without IV contrast. COMPARISON:  CT abdomen pelvis dated 05/13/2020. FINDINGS: Evaluation of this exam is limited in the absence of intravenous contrast. Lower chest: Bilateral confluent pulmonary consolidation may represent atelectasis but concerning for pneumonia. Clinical correlation is recommended. There is coronary vascular calcification.  No intra-abdominal free air or free fluid. Hepatobiliary: The liver is unremarkable. No intrahepatic biliary dilatation. Cholecystectomy. There is a 4.3 x 2.4 cm somewhat complex and slightly higher attenuating fluid collection along the cholecystectomy bed which may represent postsurgical seroma. Developing abscess or biloma is not excluded. Clinical correlation and follow-up as clinically indicated. No retained calcified stone noted in the central CBD. Pancreas: Unremarkable. No pancreatic ductal dilatation or surrounding inflammatory changes. Spleen: Normal in size without focal abnormality. Adrenals/Urinary Tract: The adrenal glands unremarkable. Moderate bilateral renal parenchyma atrophy. There is no hydronephrosis or nephrolithiasis on either side. Extensive renal vascular calcifications. The visualized ureters appear unremarkable. The urinary bladder is collapsed. Stomach/Bowel: There is no bowel obstruction or active inflammation. The appendix is normal. Vascular/Lymphatic: Advanced aortoiliac atherosclerotic disease as well as atherosclerotic calcification of the mesenteric vasculature. An infrarenal IVC filter is noted. No portal venous gas. There is no adenopathy. Reproductive: The prostate and seminal vesicles are grossly unremarkable. No pelvic masses Other: A small lipoma in the left ileus psoas muscle anterior to the hip. Small fat containing left paraumbilical hernia. Right upper quadrant surgical incision with small amount of fluid and pockets of air. No loculated collection. Musculoskeletal: Degenerative changes of the spine. No acute osseous pathology. IMPRESSION: 1. Status post cholecystectomy with a small somewhat complex collection along the cholecystectomy bed, likely postsurgical seroma. Clinical correlation and follow-up as clinically indicated. 2. Bilateral confluent pulmonary consolidation may represent atelectasis or pneumonia. 3. No bowel obstruction. Normal appendix. 4. Aortic  Atherosclerosis (ICD10-I70.0). Electronically Signed   By: Anner Crete [name]   On: 05/20/2020 17:47    ASSESMENT:   *    Dropping Hb, acute on chronic anemia with dark stool 2 days ago.  Suspect bleeding is from sphincterotomy site.  Self-limited bleeding at the site noted in the ERCP op note of 12/18. Hospitalist has switched him off his once daily Protonix onto Protonix 40 mg IV Q 12 hours.  *   Chronic Coumadin, had been held for the ERCP, resumed 2 days ago.  Now discontinued, last dose was yesterday evening.  Today's INR 2.7.  *     Normocytic anemia.  *    Thrombocytopenia.  *      ESRD.  Undergoing dialysis today.   PLAN   *    Correct INR with vitamin K.  Received 10 mg PO at 1130 this morning. Recheck INR in AM.   Given that he is hemodynamically stable and only reports the 1 black stool, do not feel we need to administer FFP. Continue PPI.  May have clears today, will need n.p.o. for procedure tomorrow Has a slot for EGD at 2:30 PM tomorrow with Dr.Gessner    Azucena Freed  05/21/2020, 11:07 AM Phone [phone]

## 2020-05-21 NOTE — Progress Notes (Signed)
PT Cancellation Note  Patient Details Name: [name] MRN: [id number] DOB: [birth year]   Cancelled Treatment:    Reason Eval/Treat Not Completed: Fatigue/lethargy limiting ability to participate;Other (comment);Medical issues which prohibited therapy.  Had HD and is not feeling well.  Asked to reattempt in the AM.     Ramond Dial 05/21/2020, 5:22 PM   Mee Hives, PT MS Acute Rehab Dept. Number: [place] [facility] [place]

## 2020-05-21 NOTE — Progress Notes (Signed)
Spoke with Juanell Fairly NP to clarify if PRBC to be given with last hgb 8.3. pt. bp trending down with sbp 95. Orders per Juanell Fairly NP to administer PRBC. Pt. stable

## 2020-05-21 NOTE — Progress Notes (Signed)
PROGRESS NOTE    [name]  TWS:[id number] DOB: [birth year] DOA: 05/13/2020 PCP: Burnard Bunting, MD   Brief Narrative: 69 year old with past medical history significant for peripheral vascular disease a status post right transmetatarsal amputation, and non Hodgkin lymphoma, type 2 diabetes mellitus, hypertension, hyperlipidemia, chronic systolic congestive heart failure, A. fib on Coumadin, history of pericardial tamponade status post window, OSA not on CPAP, ESRD on hemodialysis TTS who presented from general surgery office with persistent abdominal pain over the past 2 or 3 weeks.  Patient report pain as intermittent, not worse after eating.  Patient denies any fever chills night sweats, nausea vomiting.  Evaluation in the ED vitals are stable, potassium 4.3, chloride 96, CO2 28, glucose 130, BUN 52, creatinine 9.6, glucose 130, lipase 43.  Bilirubin 0.8.  White blood cell 5.8, hemoglobin 11.6, platelets 154.  COVID-19 influenza negative.  Right upper quadrant ultrasound with large solitary gallstone without evidence of cholecystitis, CBD 10 mm consistent with mild dilation, increase echogenicity hepatic parenchyma, possible right nephrolithiasis.  GI, general surgery were consulted.  Hospitalist service consulted for admission and further evaluation and management.  Patient underwent ERCP 12/17 which showed gastritis, complete removal of choledocholithiasis was accomplished by biliary sphincterectomy and balloon.  Patient subsequently underwent cholecystectomy on 05/18/2020.  Hospital  course complicated by acute blood loss anemia and hypotension.  Patient heparin drip discontinue on 12/20.  CT abdomen and pelvis was negative for retroperitoneal bleed, only showed a small serous  fluid at the prior cholecystectomy site.  Patient today reporter  black stool, 2 episodes on 12/20.  Concern for possible post sphinterectomy bleed. Started on IV Protonix. Discussed with Dr Enis Gash plan for vitamin  K. Patient might required scope tomorrow, earlier if he develops severe bleed.   Assessment & Plan:   Principal Problem:   Cholelithiasis without obstruction Active Problems:   Hyperlipidemia   ESRD (end stage renal disease) (HCC)   Systolic heart failure (HCC)   Hypotension   Class 1 obesity due to excess calories with body mass index (BMI) of 33.0 to 33.9 in adult   Choledocholithiasis  1-Choledocholithiasis: -Patient presented to the ED from general surgery office with persistent abdominal pain over the last 2 or 3 weeks. Patient was found to have CBD ductal dilation with a gallstone within the gallbladder on  right upper quadrant ultrasound. -CT renal was done.if with choledocholithiasis with a small stone distal common bile duct measuring up to 8 mm with biliary ductal dilation, mild gallbladder distention and intraluminal gallstone with pericholecystic inflammation -[place], GI and general surgery following. -Underwent ERCP 12/17: Gastritis, status post biopsy, the major papilla appeared to be prominent edematous, filling defect consistent with a stone and sludge were seen on the cholangiogram.  The entire main bile duct was severely dilated.  Choledocholithiasis was found.  Complete removal was accomplished by biliary sphincterotomy and balloon drugs. -Underwent cholecystectomy on 05/18/2020. He was tolerating diet.   2-Acute blood loss anemia; could be related post sphincterectomy bleed.  Report melena.  Hb has drop from 11---8.3  He will received one unit PRBC today during HD.  GI informed of change in condition.  IV protonix added.  Heparin Gtt Discontinue 12/20. INR today 2.7. plan for oral vitamin K.    3-Chronic Systolic Congestive Heart Failure, nonischemic cardiomyopathy, essential hypertension History of left ventricular ejection fraction 30 to 35% with improvement to 50 to 55% in 2020. Patient was evaluated by cardiology on 05/13/2020 for preoperative evaluation.   No indication for  further testing at this time. Not on ACE or ARB or beta-blocker at home due to history of hypotension.  4-Chronic Hypotension; recurrent Hypotension  Midodrine was on hold because he was having  elevated BP.  Develops recurrent Hypotension 12/20.Marland Kitchen Received  IV bolus. Lactic acid normal.  Resume midodrine.  Suspect related to GI bleed. Hb decreased from 11---8.  CT abdomen only showed seroma.  See problem Number 2.   4-Paroxysmal A. fib/flutter: On anticoagulation Coumadin for held prior to  ERCP and cholecystectomy.  Now with GI bleed. Holding heparin/coumadin   5-ESRD on hemodialysis: Continue with dialysis TTS. Appreciate nephrology follow-up Getting HD today.   History of non-Hodgkin lymphoma status post chemo in remission, history of sarcomatoid tumor scalp: Follow-up with oncology as an outpatient.  HTN; started on BP medications this admission. Monitor.  Hold Norvasc , he has now Hypotension.    Estimated body mass index is 32.06 kg/m as calculated from the following:   Height as of this encounter: 6' 3.5" (1.918 m).   Weight as of this encounter: 117.9 kg.   DVT prophylaxis: scd Code Status: Full Code Family Communication: care discussed with  Disposition Plan:  Status is: Inpatient  Remains inpatient appropriate because:Ongoing active pain requiring inpatient pain management and IV treatments appropriate due to intensity of illness or inability to take PO   Dispo:  Patient From: Home  Planned Disposition: Home  Expected discharge date: 05/23/2020  Medically stable for discharge: No, develops GI bleed.          Consultants:   GI  Nephrology  Procedures:   None  Antimicrobials:    Subjective: Seen in HD> he report mild abdominal pain.  He report Melena, two episodes yesterday. No BM today, so far.    Objective: Vitals:   05/21/20 0745 05/21/20 0800 05/21/20 0830 05/21/20 0900  BP: 131/75 112/67 119/65 (!) 99/59   Pulse:      Resp: (!) 26 (!) 24 15 16   Temp:      TempSrc:      SpO2:      Weight:      Height:        Intake/Output Summary (Last 24 hours) at 05/21/2020 0939 Last data filed at 05/21/2020 0600 Gross per 24 hour  Intake 60 ml  Output 0 ml  Net 60 ml   Filed Weights   05/18/20 2119 05/19/20 2110 05/20/20 2125  Weight: 115.8 kg 122.7 kg 117.9 kg    Examination:  General exam: NAD Respiratory system: CTA Cardiovascular system: S 1, S 2 RRR Gastrointestinal system: BS present, soft, nt Central nervous system: Alert, following command Extremities:  No edema  Data Reviewed: I have personally reviewed following labs and imaging studies  CBC: Recent Labs  Lab 05/18/20 1921 05/19/20 0138 05/20/20 0442 05/20/20 1431 05/20/20 1934 05/21/20 0345  WBC 15.4* 11.7* 12.6* 13.0*  --  11.7*  HGB 12.5* 12.4* 10.4* 9.0* 8.7* 8.3*  HCT 38.0* 38.2* 32.0* 26.7* 26.2* 23.5*  MCV 93.6 93.6 93.6 92.4  --  90.4  PLT 128* 144* 135* 128*  --  89*   Basic Metabolic Panel: Recent Labs  Lab 05/16/20 1048 05/17/20 0224 05/18/20 0853 05/19/20 0138 05/20/20 1134 05/21/20 0901  NA 136 136 137 138 132* 130*  K 4.1 4.0 4.1 4.6 4.1 4.2  CL 94* 98 98 96* 92* 93*  CO2 25 23  --  24 23 19*  GLUCOSE 111* 86 111* 141* 154* 96  BUN 37* 26* 43* 25*  80* 102*  CREATININE 9.14* 7.06* 8.90* 6.93* 10.01* 11.84*  CALCIUM 8.1* 8.1*  --  8.3* 7.4* 7.6*  PHOS 4.8*  --   --   --  4.6 4.6   GFR: Estimated Creatinine Clearance: 8.4 mL/min (A) (by C-G formula based on SCr of 11.84 mg/dL (H)). Liver Function Tests: Recent Labs  Lab 05/15/20 0151 05/16/20 1048 05/17/20 0224 05/19/20 0138 05/20/20 1134 05/21/20 0901  AST 26  --  26 47*  --   --   ALT 30  --  32 41  --   --   ALKPHOS 141*  --  139* 146*  --   --   BILITOT 1.2  --  1.3* 1.1  --   --   PROT 6.4*  --  6.9 7.1  --   --   ALBUMIN 3.0* 3.1* 3.1* 3.2* 2.4* 2.5*   No results for input(s): LIPASE, AMYLASE in the last 168 hours. No  results for input(s): AMMONIA in the last 168 hours. Coagulation Profile: Recent Labs  Lab 05/17/20 0224 05/18/20 0227 05/19/20 0138 05/20/20 0442 05/21/20 0530  INR 1.6* 1.4* 1.4* 1.9* 2.7*   Cardiac Enzymes: No results for input(s): CKTOTAL, CKMB, CKMBINDEX, TROPONINI in the last 168 hours. BNP (last 3 results) No results for input(s): PROBNP in the last 8760 hours. HbA1C: No results for input(s): HGBA1C in the last 72 hours. CBG: Recent Labs  Lab 05/19/20 2109 05/20/20 0703 05/20/20 1119 05/20/20 1643 05/21/20 0634  GLUCAP 139* 119* 141* 137* 103*   Lipid Profile: No results for input(s): CHOL, HDL, LDLCALC, TRIG, CHOLHDL, LDLDIRECT in the last 72 hours. Thyroid Function Tests: No results for input(s): TSH, T4TOTAL, FREET4, T3FREE, THYROIDAB in the last 72 hours. Anemia Panel: No results for input(s): VITAMINB12, FOLATE, FERRITIN, TIBC, IRON, RETICCTPCT in the last 72 hours. Sepsis Labs: Recent Labs  Lab 05/20/20 1134 05/20/20 1431  LATICACIDVEN 1.5 1.5    Recent Results (from the past 240 hour(s))  Resp Panel by RT-PCR (Flu A&B, Covid) Nasopharyngeal Swab     Status: None   Collection Time: 05/13/20  4:07 PM   Specimen: Nasopharyngeal Swab; Nasopharyngeal(NP) swabs in vial transport medium  Result Value Ref Range Status   SARS Coronavirus 2 by RT PCR NEGATIVE NEGATIVE Final    Comment: (NOTE) SARS-CoV-2 target nucleic acids are NOT DETECTED.  The SARS-CoV-2 RNA is generally detectable in upper respiratory specimens during the acute phase of infection. The lowest concentration of SARS-CoV-2 viral copies this assay can detect is 138 copies/mL. A negative result does not preclude SARS-Cov-2 infection and should not be used as the sole basis for treatment or other patient management decisions. A negative result may occur with  improper specimen collection/handling, submission of specimen other than nasopharyngeal swab, presence of viral mutation(s) within  the areas targeted by this assay, and inadequate number of viral copies(<138 copies/mL). A negative result must be combined with clinical observations, patient history, and epidemiological information. The expected result is Negative.  Fact Sheet for Patients:  EntrepreneurPulse.com.au  Fact Sheet for Healthcare Providers:  IncredibleEmployment.be  This test is no t yet approved or cleared by the [place] FDA and  has been authorized for detection and/or diagnosis of SARS-CoV-2 by FDA under an Emergency Use Authorization (EUA). This EUA will remain  in effect (meaning this test can be used) for the duration of the COVID-19 declaration under Section 564(b)(1) of the Act, 21 U.S.C.section 360bbb-3(b)(1), unless the authorization is terminated  or revoked sooner.  Influenza A by PCR NEGATIVE NEGATIVE Final   Influenza B by PCR NEGATIVE NEGATIVE Final    Comment: (NOTE) The Xpert Xpress SARS-CoV-2/FLU/RSV plus assay is intended as an aid in the diagnosis of influenza from Nasopharyngeal swab specimens and should not be used as a sole basis for treatment. Nasal washings and aspirates are unacceptable for Xpert Xpress SARS-CoV-2/FLU/RSV testing.  Fact Sheet for Patients: EntrepreneurPulse.com.au  Fact Sheet for Healthcare Providers: IncredibleEmployment.be  This test is not yet approved or cleared by the [place] FDA and has been authorized for detection and/or diagnosis of SARS-CoV-2 by FDA under an Emergency Use Authorization (EUA). This EUA will remain in effect (meaning this test can be used) for the duration of the COVID-19 declaration under Section 564(b)(1) of the Act, 21 U.S.C. section 360bbb-3(b)(1), unless the authorization is terminated or revoked.  Performed at [facility], [place] [address] [place], [place] [address]   MRSA PCR Screening     Status: None   Collection Time:  05/14/20  3:18 PM   Specimen: Nasopharyngeal  Result Value Ref Range Status   MRSA by PCR NEGATIVE NEGATIVE Final    Comment:        The GeneXpert MRSA Assay (FDA approved for NASAL specimens only), is one component of a comprehensive MRSA colonization surveillance program. It is not intended to diagnose MRSA infection nor to guide or monitor treatment for MRSA infections. Performed at [facility], [place] [address], [place], [place] [address]   Surgical pcr screen     Status: None   Collection Time: 05/18/20  2:11 AM   Specimen: Nasal Mucosa; Nasal Swab  Result Value Ref Range Status   MRSA, PCR NEGATIVE NEGATIVE Final   Staphylococcus aureus NEGATIVE NEGATIVE Final    Comment: (NOTE) The Xpert SA Assay (FDA approved for NASAL specimens in patients 10 years of age and older), is one component of a comprehensive surveillance program. It is not intended to diagnose infection nor to guide or monitor treatment. Performed at [facility], [place] [address], [place], [place] [address]          Radiology Studies: CT ABDOMEN PELVIS WO CONTRAST  Result Date: 05/20/2020 CLINICAL DATA:  69 year old male with concern for retroperitoneal hematoma. EXAM: CT ABDOMEN AND PELVIS WITHOUT CONTRAST TECHNIQUE: Multidetector CT imaging of the abdomen and pelvis was performed following the standard protocol without IV contrast. COMPARISON:  CT abdomen pelvis dated 05/13/2020. FINDINGS: Evaluation of this exam is limited in the absence of intravenous contrast. Lower chest: Bilateral confluent pulmonary consolidation may represent atelectasis but concerning for pneumonia. Clinical correlation is recommended. There is coronary vascular calcification. No intra-abdominal free air or free fluid. Hepatobiliary: The liver is unremarkable. No intrahepatic biliary dilatation. Cholecystectomy. There is a 4.3 x 2.4 cm somewhat complex and slightly higher attenuating fluid collection along the  cholecystectomy bed which may represent postsurgical seroma. Developing abscess or biloma is not excluded. Clinical correlation and follow-up as clinically indicated. No retained calcified stone noted in the central CBD. Pancreas: Unremarkable. No pancreatic ductal dilatation or surrounding inflammatory changes. Spleen: Normal in size without focal abnormality. Adrenals/Urinary Tract: The adrenal glands unremarkable. Moderate bilateral renal parenchyma atrophy. There is no hydronephrosis or nephrolithiasis on either side. Extensive renal vascular calcifications. The visualized ureters appear unremarkable. The urinary bladder is collapsed. Stomach/Bowel: There is no bowel obstruction or active inflammation. The appendix is normal. Vascular/Lymphatic: Advanced aortoiliac atherosclerotic disease as well as atherosclerotic calcification of the mesenteric vasculature. An  infrarenal IVC filter is noted. No portal venous gas. There is no adenopathy. Reproductive: The prostate and seminal vesicles are grossly unremarkable. No pelvic masses Other: A small lipoma in the left ileus psoas muscle anterior to the hip. Small fat containing left paraumbilical hernia. Right upper quadrant surgical incision with small amount of fluid and pockets of air. No loculated collection. Musculoskeletal: Degenerative changes of the spine. No acute osseous pathology. IMPRESSION: 1. Status post cholecystectomy with a small somewhat complex collection along the cholecystectomy bed, likely postsurgical seroma. Clinical correlation and follow-up as clinically indicated. 2. Bilateral confluent pulmonary consolidation may represent atelectasis or pneumonia. 3. No bowel obstruction. Normal appendix. 4. Aortic Atherosclerosis (ICD10-I70.0). Electronically Signed   By: Anner Crete [name]   On: 05/20/2020 17:47        Scheduled Meds: . (feeding supplement) PROSource Plus  30 mL Oral BID BM  . sodium chloride   Intravenous Once  .  Chlorhexidine Gluconate Cloth  6 each Topical Q0600  . cinacalcet  30 mg Oral BID AC  . doxercalciferol  5 mcg Intravenous Q T,Th,Sa-HD  . insulin aspart  0-6 Units Subcutaneous TID WC  . iron polysaccharides  150 mg Oral Daily  . midodrine  10 mg Oral TID WC  . multivitamin  1 tablet Oral QHS  . pantoprazole  40 mg Oral Daily  . sevelamer carbonate  800 mg Oral TID WC  . Warfarin - Pharmacist Dosing Inpatient   Does not apply q1600   Continuous Infusions: . sodium chloride    . sodium chloride       LOS: 8 days    Time spent: 35 minutes.     Elmarie Shiley, MD [facility]   If 7PM-7AM, please contact night-coverage [email or web address]  05/21/2020, 9:39 AM

## 2020-05-21 NOTE — Care Management Important Message (Signed)
Important Message  Patient Details  Name: [name] MRN: [id number] Date of Birth: 1951-02-24   Medicare Important Message Given:  Yes     Aleiah Mohammed P [place] 05/21/2020, 2:08 PM

## 2020-05-21 NOTE — Progress Notes (Signed)
ANTICOAGULATION CONSULT NOTE  Pharmacy Consult for Heparin --> warfarin Indication: atrial fibrillation  No Known Allergies  Patient Measurements: Height: 6' 3.5" (191.8 cm) Weight: 117.9 kg (259 lb 14.8 oz) IBW/kg (Calculated) : 85.65 Heparin Dosing Weight: 111.7 kg  Vital Signs: Temp: 99.3 F (37.4 C) (12/21 0455) Temp Source: Oral (12/21 0455) BP: 99/55 (12/21 0930) Pulse Rate: 96 (12/21 0455)  Labs: Recent Labs    05/19/20 0138 05/20/20 0442 05/20/20 1134 05/20/20 1431 05/20/20 1934 05/21/20 0345 05/21/20 0530 05/21/20 0901  HGB 12.4* 10.4*  --  9.0* 8.7* 8.3*  --   --   HCT 38.2* 32.0*  --  26.7* 26.2* 23.5*  --   --   PLT 144* 135*  --  128*  --  89*  --   --   LABPROT 16.3* 21.4*  --   --   --   --  27.9*  --   INR 1.4* 1.9*  --   --   --   --  2.7*  --   HEPARINUNFRC 0.47 0.33  --   --   --   --   --   --   CREATININE 6.93*  --  10.01*  --   --   --   --  11.84*    Estimated Creatinine Clearance: 8.4 mL/min (A) (by C-G formula based on SCr of 11.84 mg/dL (H)).   Medical History: Past Medical History:  Diagnosis Date  . Anemia   . Arthritis    HNP- lumbar, "all over my body"  . Blood transfusion    "years ago; blood was low" (08/05/2013)  . Diabetic nephropathy ([place])   . Diabetic retinopathy   . DVT (deep venous thrombosis) ([place])    "got one in my right leg now; I've had one before too, not sure which leg" (08/05/2013)  . ESRD (end stage renal disease) on dialysis ([place])    TTS, [place]  . Family history of anesthesia complication    " my son wakes up slowly"  . GERD (gastroesophageal reflux disease)    uses alka seltzere on occas.   [name])    "one q now and then" (08/05/2013)  . Hyperlipidemia   . Hypertension   . 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 Non Hodgkin's lymphoma ([place])    Tx 2009; "had chemo; it went away" (08/05/2013)  . Noncompliance 03/16/2012   . NSVT (nonsustained ventricular tachycardia) ([place]) 03/18/2012  . Peripheral vascular disease ([place])   . Poor historian    pt. unsure of several answers to health history questions   . Skin cancer    melanoma - head  . Sleep apnea    "suppose to have a sleep study, but they never told me when. (08/05/2013)    Medications:  Scheduled:  . (feeding supplement) PROSource Plus  30 mL Oral BID BM  . sodium chloride   Intravenous Once  . Chlorhexidine Gluconate Cloth  6 each Topical Q0600  . cinacalcet  30 mg Oral BID AC  . doxercalciferol  5 mcg Intravenous Q T,Th,Sa-HD  . insulin aspart  0-6 Units Subcutaneous TID WC  . iron polysaccharides  150 mg Oral Daily  . midodrine  10 mg Oral TID WC  . multivitamin  1 tablet Oral QHS  . pantoprazole  40 mg Oral Daily  . sevelamer carbonate  800 mg Oral TID WC  . Warfarin -  Pharmacist Dosing Inpatient   Does not apply q1600    Assessment: Patient is a 69 yo male that is being admitted for Cholelithiasis, CBD dilatation. The patient is on warfarin at home for afib.  PTA dose is 5mg  daily except for 7.5mg  on Tuesdays.  Pharmacy has been asked to restart warfarin.  Heparin on hold for decrease in Hgb. Hgb down  12.4>>10.4>>8.3.   INR today therapeutic at 2.7 (but large jump in INR).  Will hold warfarin tonight.   Goal of Therapy:  Monitor platelets by anticoagulation protocol: Yes  INR 2-3   Plan:  Warfarin on hold tonight Monitor hgb daily  F/u daily HL, daily CBC, and daily INR   [name] A. Levada Dy, PharmD, BCPS, [facility] Clinical Pharmacist [place] Please utilize Amion for appropriate phone number to reach the unit pharmacist ([place])   05/21/2020 10:01 AM   Please check AMION for all [place] phone numbers After 10:00 PM, call [place] [phone]

## 2020-05-21 NOTE — Plan of Care (Signed)
  Problem: Education: Goal: Knowledge of General Education information will improve Description Including pain rating scale, medication(s)/side effects and non-pharmacologic comfort measures Outcome: Progressing   Problem: Clinical Measurements: Goal: Ability to maintain clinical measurements within normal limits will improve Outcome: Progressing   Problem: Activity: Goal: Risk for activity intolerance will decrease Outcome: Progressing   

## 2020-05-21 NOTE — H&P (View-Only) (Signed)
Daily Rounding Note  05/21/2020, 11:07 AM  LOS: 8 days   SUBJECTIVE:   Chief complaint:   New onset GI bleeding  See original GI consult performed during this admission on 05/14/2020.  Admitted over 1 week ago with abdominal pain in the setting of cholelithiasis, choledocholithiasis.  Mild elevation of LFTs.  ERCP delayed due to patient being on Coumadin with presenting INR 2.3.  05/17/2020 ERCP. Patchy, moderate gastritis, biopsied.  Prominent, bulging, edematous enlarged major papilla with displacement of pancreatic duct versus small fistula at the upper portion of ampulla that was draining bile.  Filling defects on cholangiogram.  Severe dilation of main bile duct.  Stones/sludge removed following biliary sphincterotomy and balloon sweep.  Short lived oozing of blood at sphincterotomy site resolved on its own.   05/18/20 Lap chole.  Noted dilated and chronically inflammed GB, no filling defect at [facility].   Warfarin was resumed on 12/19, last dose was 12/20.  No interim heparin. INR this morning 2.7.  Was as low as 1.4 on the 18th and 19th. Hgb has gone from 12.4 to 9 to 8.3 over the last 3 days. Progressive thrombocytopenia 144 >> 89  PO pt report he had a dark stool on Sunday but no stools since then.  Today is Tuesday has postoperative pain on the right abdomen.  No nausea, vomiting.    OBJECTIVE:         Vital signs in last 24 hours:    Temp:  [97.8 F (36.6 C)-99.6 F (37.6 C)] 97.8 F (36.6 C) (12/21 1030) Pulse Rate:  [60-109] 96 (12/21 0455) Resp:  [15-26] 19 (12/21 1045) BP: (84-134)/(50-75) 94/53 (12/21 1045) SpO2:  [91 %-98 %] 93 % (12/21 1032) Weight:  [117.9 kg] 117.9 kg (12/20 2125) Last BM Date: 05/21/20 Filed Weights   05/18/20 2119 05/19/20 2110 05/20/20 2125  Weight: 115.8 kg 122.7 kg 117.9 kg   General: Looks well, comfortable.  Currently undergoing hemodialysis Heart: RRR.  No MRG. Chest: Clear  bilaterally.  No labored breathing, no cough Abdomen: Tenderness over on the right side.  Glued surgical scars CDI.  No distention.  Active bowel sounds Extremities: No CCE.  Right forefoot amputation Neuro/Psych: Pleasant, calm.  Oriented x3.  Fluid speech.  Seems a reliable historian.  Intake/Output from previous day: 12/20 0701 - 12/21 0700 In: 180 [P.O.:180] Out: 0   Intake/Output this shift: Total I/O In: 315 [Blood:315] Out: -   Lab Results: Recent Labs    05/20/20 0442 05/20/20 1431 05/20/20 1934 05/21/20 0345  WBC 12.6* 13.0*  --  11.7*  HGB 10.4* 9.0* 8.7* 8.3*  HCT 32.0* 26.7* 26.2* 23.5*  PLT 135* 128*  --  89*   BMET Recent Labs    05/19/20 0138 05/20/20 1134 05/21/20 0901  NA 138 132* 130*  K 4.6 4.1 4.2  CL 96* 92* 93*  CO2 24 23 19*  GLUCOSE 141* 154* 96  BUN 25* 80* 102*  CREATININE 6.93* 10.01* 11.84*  CALCIUM 8.3* 7.4* 7.6*   LFT Recent Labs    05/19/20 0138 05/20/20 1134 05/21/20 0901  PROT 7.1  --   --   ALBUMIN 3.2* 2.4* 2.5*  AST 47*  --   --   ALT 41  --   --   ALKPHOS 146*  --   --   BILITOT 1.1  --   --    PT/INR Recent Labs    05/20/20 0442 05/21/20 0530  LABPROT 21.4* 27.9*  INR 1.9* 2.7*   Hepatitis Panel Recent Labs    05/18/20 1501  HEPBSAG NON REACTIVE  HEPBIGM NON REACTIVE    Studies/Results: CT ABDOMEN PELVIS WO CONTRAST  Result Date: 05/20/2020 CLINICAL DATA:  69 year old male with concern for retroperitoneal hematoma. EXAM: CT ABDOMEN AND PELVIS WITHOUT CONTRAST TECHNIQUE: Multidetector CT imaging of the abdomen and pelvis was performed following the standard protocol without IV contrast. COMPARISON:  CT abdomen pelvis dated 05/13/2020. FINDINGS: Evaluation of this exam is limited in the absence of intravenous contrast. Lower chest: Bilateral confluent pulmonary consolidation may represent atelectasis but concerning for pneumonia. Clinical correlation is recommended. There is coronary vascular calcification.  No intra-abdominal free air or free fluid. Hepatobiliary: The liver is unremarkable. No intrahepatic biliary dilatation. Cholecystectomy. There is a 4.3 x 2.4 cm somewhat complex and slightly higher attenuating fluid collection along the cholecystectomy bed which may represent postsurgical seroma. Developing abscess or biloma is not excluded. Clinical correlation and follow-up as clinically indicated. No retained calcified stone noted in the central CBD. Pancreas: Unremarkable. No pancreatic ductal dilatation or surrounding inflammatory changes. Spleen: Normal in size without focal abnormality. Adrenals/Urinary Tract: The adrenal glands unremarkable. Moderate bilateral renal parenchyma atrophy. There is no hydronephrosis or nephrolithiasis on either side. Extensive renal vascular calcifications. The visualized ureters appear unremarkable. The urinary bladder is collapsed. Stomach/Bowel: There is no bowel obstruction or active inflammation. The appendix is normal. Vascular/Lymphatic: Advanced aortoiliac atherosclerotic disease as well as atherosclerotic calcification of the mesenteric vasculature. An infrarenal IVC filter is noted. No portal venous gas. There is no adenopathy. Reproductive: The prostate and seminal vesicles are grossly unremarkable. No pelvic masses Other: A small lipoma in the left ileus psoas muscle anterior to the hip. Small fat containing left paraumbilical hernia. Right upper quadrant surgical incision with small amount of fluid and pockets of air. No loculated collection. Musculoskeletal: Degenerative changes of the spine. No acute osseous pathology. IMPRESSION: 1. Status post cholecystectomy with a small somewhat complex collection along the cholecystectomy bed, likely postsurgical seroma. Clinical correlation and follow-up as clinically indicated. 2. Bilateral confluent pulmonary consolidation may represent atelectasis or pneumonia. 3. No bowel obstruction. Normal appendix. 4. Aortic  Atherosclerosis (ICD10-I70.0). Electronically Signed   By: Anner Crete [name]   On: 05/20/2020 17:47    ASSESMENT:   *    Dropping Hb, acute on chronic anemia with dark stool 2 days ago.  Suspect bleeding is from sphincterotomy site.  Self-limited bleeding at the site noted in the ERCP op note of 12/18. Hospitalist has switched him off his once daily Protonix onto Protonix 40 mg IV Q 12 hours.  *   Chronic Coumadin, had been held for the ERCP, resumed 2 days ago.  Now discontinued, last dose was yesterday evening.  Today's INR 2.7.  *     Normocytic anemia.  *    Thrombocytopenia.  *      ESRD.  Undergoing dialysis today.   PLAN   *    Correct INR with vitamin K.  Received 10 mg PO at 1130 this morning. Recheck INR in AM.   Given that he is hemodynamically stable and only reports the 1 black stool, do not feel we need to administer FFP. Continue PPI.  May have clears today, will need n.p.o. for procedure tomorrow Has a slot for EGD at 2:30 PM tomorrow with Dr.Gessner    Azucena Freed  05/21/2020, 11:07 AM Phone [phone]

## 2020-05-21 NOTE — Progress Notes (Addendum)
[facility] Progress Note   Subjective: Seen on HD more alert today. No C/Os.     Objective Vitals:   05/21/20 0830 05/21/20 0900 05/21/20 0930 05/21/20 1000  BP: 119/65 (!) 99/59 (!) 99/55 (!) 95/57  Pulse:      Resp: 15 16 15 15   Temp:      TempSrc:      SpO2:      Weight:      Height:       Physical Exam General: WN, WD older male in NAD Heart: S1,S2 no M/R/G Lungs: CTAB Abdomen: Obese, tenderness around lap chole incisions. Active BS Extremities: No LE edema.  Dialysis Access: R AVF cannulated     Additional Objective Labs: Basic Metabolic Panel: Recent Labs  Lab 05/16/20 1048 05/17/20 0224 05/19/20 0138 05/20/20 1134 05/21/20 0901  NA 136   < > 138 132* 130*  K 4.1   < > 4.6 4.1 4.2  CL 94*   < > 96* 92* 93*  CO2 25   < > 24 23 19*  GLUCOSE 111*   < > 141* 154* 96  BUN 37*   < > 25* 80* 102*  CREATININE 9.14*   < > 6.93* 10.01* 11.84*  CALCIUM 8.1*   < > 8.3* 7.4* 7.6*  PHOS 4.8*  --   --  4.6 4.6   < > = values in this interval not displayed.   Liver Function Tests: Recent Labs  Lab 05/15/20 0151 05/16/20 1048 05/17/20 0224 05/19/20 0138 05/20/20 1134 05/21/20 0901  AST 26  --  26 47*  --   --   ALT 30  --  32 41  --   --   ALKPHOS 141*  --  139* 146*  --   --   BILITOT 1.2  --  1.3* 1.1  --   --   PROT 6.4*  --  6.9 7.1  --   --   ALBUMIN 3.0*   < > 3.1* 3.2* 2.4* 2.5*   < > = values in this interval not displayed.   No results for input(s): LIPASE, AMYLASE in the last 168 hours. CBC: Recent Labs  Lab 05/18/20 1921 05/19/20 0138 05/20/20 0442 05/20/20 1431 05/20/20 1934 05/21/20 0345  WBC 15.4* 11.7* 12.6* 13.0*  --  11.7*  HGB 12.5* 12.4* 10.4* 9.0* 8.7* 8.3*  HCT 38.0* 38.2* 32.0* 26.7* 26.2* 23.5*  MCV 93.6 93.6 93.6 92.4  --  90.4  PLT 128* 144* 135* 128*  --  89*   Blood Culture    Component Value Date/Time   SDES FLUID PERICARDIAL 01/10/2018 1015   SPECREQUEST B 01/10/2018 1015   CULT  01/10/2018 1015     NO GROWTH 3 DAYS Performed at [facility], [place] [address], [place], [place] [address]    REPTSTATUS 01/13/2018 FINAL 01/10/2018 1015    Cardiac Enzymes: No results for input(s): CKTOTAL, CKMB, CKMBINDEX, TROPONINI in the last 168 hours. CBG: Recent Labs  Lab 05/19/20 2109 05/20/20 0703 05/20/20 1119 05/20/20 1643 05/21/20 0634  GLUCAP 139* 119* 141* 137* 103*   Iron Studies: No results for input(s): IRON, TIBC, TRANSFERRIN, FERRITIN in the last 72 hours. @lablastinr3 @ Studies/Results: CT ABDOMEN PELVIS WO CONTRAST  Result Date: 05/20/2020 CLINICAL DATA:  69 year old male with concern for retroperitoneal hematoma. EXAM: CT ABDOMEN AND PELVIS WITHOUT CONTRAST TECHNIQUE: Multidetector CT imaging of the abdomen and pelvis was performed following the standard protocol without IV contrast. COMPARISON:  CT abdomen pelvis  dated 05/13/2020. FINDINGS: Evaluation of this exam is limited in the absence of intravenous contrast. Lower chest: Bilateral confluent pulmonary consolidation may represent atelectasis but concerning for pneumonia. Clinical correlation is recommended. There is coronary vascular calcification. No intra-abdominal free air or free fluid. Hepatobiliary: The liver is unremarkable. No intrahepatic biliary dilatation. Cholecystectomy. There is a 4.3 x 2.4 cm somewhat complex and slightly higher attenuating fluid collection along the cholecystectomy bed which may represent postsurgical seroma. Developing abscess or biloma is not excluded. Clinical correlation and follow-up as clinically indicated. No retained calcified stone noted in the central CBD. Pancreas: Unremarkable. No pancreatic ductal dilatation or surrounding inflammatory changes. Spleen: Normal in size without focal abnormality. Adrenals/Urinary Tract: The adrenal glands unremarkable. Moderate bilateral renal parenchyma atrophy. There is no hydronephrosis or nephrolithiasis on either side. Extensive renal vascular  calcifications. The visualized ureters appear unremarkable. The urinary bladder is collapsed. Stomach/Bowel: There is no bowel obstruction or active inflammation. The appendix is normal. Vascular/Lymphatic: Advanced aortoiliac atherosclerotic disease as well as atherosclerotic calcification of the mesenteric vasculature. An infrarenal IVC filter is noted. No portal venous gas. There is no adenopathy. Reproductive: The prostate and seminal vesicles are grossly unremarkable. No pelvic masses Other: A small lipoma in the left ileus psoas muscle anterior to the hip. Small fat containing left paraumbilical hernia. Right upper quadrant surgical incision with small amount of fluid and pockets of air. No loculated collection. Musculoskeletal: Degenerative changes of the spine. No acute osseous pathology. IMPRESSION: 1. Status post cholecystectomy with a small somewhat complex collection along the cholecystectomy bed, likely postsurgical seroma. Clinical correlation and follow-up as clinically indicated. 2. Bilateral confluent pulmonary consolidation may represent atelectasis or pneumonia. 3. No bowel obstruction. Normal appendix. 4. Aortic Atherosclerosis (ICD10-I70.0). Electronically Signed   By: Anner Crete [name]   On: 05/20/2020 17:47   Medications: . sodium chloride    . sodium chloride     . doxercalciferol      . (feeding supplement) PROSource Plus  30 mL Oral BID BM  . sodium chloride   Intravenous Once  . Chlorhexidine Gluconate Cloth  6 each Topical Q0600  . cinacalcet  30 mg Oral BID AC  . doxercalciferol  5 mcg Intravenous Q T,Th,Sa-HD  . insulin aspart  0-6 Units Subcutaneous TID WC  . iron polysaccharides  150 mg Oral Daily  . midodrine  10 mg Oral TID WC  . multivitamin  1 tablet Oral QHS  . pantoprazole  40 mg Oral Daily  . sevelamer carbonate  800 mg Oral TID WC  . Warfarin - Pharmacist Dosing Inpatient   Does not apply q1600     Dialysis Orders: [place] on TTS  4:15 hr 180NRe  450/800 119.5 kg,2K, 2.25 Ca UFP 4  AVF No heparin Hectorol 9 mcg IV TIW last PTH 252, 245(decrease to 5 mcgs this hospitalization)  Assessment/Plan:. 1. Choledocholithiasis- ERCPcompleted12/17and cholecystectomy12/18.Pain well controlled. Cleared for d/c from surgery standpoint. 2. GIB: Having black stools 05/20/2020. Coumadin on hold.GI consulted.  3. ESRD-HD T,Th,S. HD today on schedule. K+ 4.2.  4. Hypotension/volume-issues with hypotension today. SBP 80-90s. Has gotten IVF bolus without improvement. DC amlodipine. Increase midodrine to 10 mg PO TID. No evidence of volume excess by exam. Very much under OP EDW. Lower EDW on DC. Sodium progressively declining. + 2308 from 12/20. Hypotensive during HD. Give additional midodrine 10 mg PO now. Attempt at least net UFG 1.5 L.  5. Anemia -Hgb12.4  12/19 progressively declining-now with black stools. 1 unit PRBCs per  primary ordered for today. Concerned for retroperitoneal bled. CT of ab/pelvis showed seroma. Heparin held over night.  6. Metabolic bone disease -C Ca 8.7. PO4 at goal. Continue renvela and sensipar.  7. DM type II-Per primary 8. Nutrition -Albumin low. Add protein supps, renal vit.  8.   A fib - Warfarin has been resumed post op.  Takiya Belmares H. Ranald Alessio NP-C 05/21/2020, 10:18 AM  [employer] [phone]

## 2020-05-22 ENCOUNTER — Encounter (HOSPITAL_COMMUNITY): Payer: Self-pay | Admitting: Internal Medicine

## 2020-05-22 ENCOUNTER — Inpatient Hospital Stay (HOSPITAL_COMMUNITY): Payer: Medicare Other | Admitting: Certified Registered"

## 2020-05-22 ENCOUNTER — Encounter (HOSPITAL_COMMUNITY)
Admission: EM | Disposition: A | Discharge status: Home Health | Payer: Self-pay | Source: Home / Self Care | Attending: Internal Medicine

## 2020-05-22 ENCOUNTER — Other Ambulatory Visit: Payer: Self-pay

## 2020-05-22 DIAGNOSIS — K921 Melena: Secondary | ICD-10-CM

## 2020-05-22 DIAGNOSIS — I4891 Unspecified atrial fibrillation: Secondary | ICD-10-CM | POA: Diagnosis present

## 2020-05-22 DIAGNOSIS — K838 Other specified diseases of biliary tract: Secondary | ICD-10-CM

## 2020-05-22 DIAGNOSIS — K259 Gastric ulcer, unspecified as acute or chronic, without hemorrhage or perforation: Secondary | ICD-10-CM

## 2020-05-22 DIAGNOSIS — K9189 Other postprocedural complications and disorders of digestive system: Secondary | ICD-10-CM

## 2020-05-22 HISTORY — PX: ESOPHAGOGASTRODUODENOSCOPY (EGD) WITH PROPOFOL: SHX5813

## 2020-05-22 LAB — PROTIME-INR
INR: 3 — ABNORMAL HIGH (ref 0.8–1.2)
INR: 2.4 — ABNORMAL HIGH (ref 0.8–1.2)
Prothrombin Time: 29.8 seconds — ABNORMAL HIGH (ref 11.4–15.2)
Prothrombin Time: 25.4 seconds — ABNORMAL HIGH (ref 11.4–15.2)

## 2020-05-22 LAB — HEMOGLOBIN AND HEMATOCRIT, BLOOD
HCT: 30.1 % — ABNORMAL LOW (ref 39.0–52.0)
Hemoglobin: 9.9 g/dL — ABNORMAL LOW (ref 13.0–17.0)

## 2020-05-22 LAB — BPAM RBC
Blood Product Expiration Date: 202201192359
ISSUE DATE / TIME: 202112211026
Unit Type and Rh: 6200

## 2020-05-22 LAB — TYPE AND SCREEN
ABO/RH(D): A POS
Antibody Screen: NEGATIVE
Unit division: 0

## 2020-05-22 LAB — GLUCOSE, CAPILLARY
Glucose-Capillary: 110 mg/dL — ABNORMAL HIGH (ref 70–99)
Glucose-Capillary: 102 mg/dL — ABNORMAL HIGH (ref 70–99)
Glucose-Capillary: 144 mg/dL — ABNORMAL HIGH (ref 70–99)

## 2020-05-22 LAB — CBC
HCT: 28 % — ABNORMAL LOW (ref 39.0–52.0)
Hemoglobin: 9.2 g/dL — ABNORMAL LOW (ref 13.0–17.0)
MCH: 30.1 pg (ref 26.0–34.0)
MCHC: 32.9 g/dL (ref 30.0–36.0)
MCV: 91.5 fL (ref 80.0–100.0)
Platelets: 167 10*3/uL (ref 150–400)
RBC: 3.06 MIL/uL — ABNORMAL LOW (ref 4.22–5.81)
RDW: 16 % — ABNORMAL HIGH (ref 11.5–15.5)
WBC: 11.6 10*3/uL — ABNORMAL HIGH (ref 4.0–10.5)
nRBC: 0 % (ref 0.0–0.2)

## 2020-05-22 SURGERY — ESOPHAGOGASTRODUODENOSCOPY (EGD) WITH PROPOFOL
Anesthesia: Monitor Anesthesia Care

## 2020-05-22 MED ORDER — PHYTONADIONE 5 MG PO TABS
10.0000 mg | ORAL_TABLET | Freq: Once | ORAL | Status: AC
  Administered 2020-05-22: 10 mg via ORAL
  Filled 2020-05-22: qty 2

## 2020-05-22 MED ORDER — LIDOCAINE 2% (20 MG/ML) 5 ML SYRINGE
INTRAMUSCULAR | Status: DC | PRN
  Administered 2020-05-22: 40 mg via INTRAVENOUS

## 2020-05-22 MED ORDER — SODIUM CHLORIDE 0.9 % IV SOLN: INTRAVENOUS | Status: DC | PRN

## 2020-05-22 MED ORDER — PROPOFOL 500 MG/50ML IV EMUL
INTRAVENOUS | Status: DC | PRN
  Administered 2020-05-22: 150 ug/kg/min via INTRAVENOUS

## 2020-05-22 SURGICAL SUPPLY — 15 items

## 2020-05-22 NOTE — Interval H&P Note (Signed)
History and Physical Interval Note:  05/22/2020 2:32 PM  [name]  has presented today for surgery, with the diagnosis of Suspect upper GI bleeding patient had dark stool a couple of days ago, declining Hgb.  Sphincterotomy at ERCP on 12/18 with limited bleeding at the sphincterotomy site reported at the time.  The various methods of treatment have been discussed with the patient and family. After consideration of risks, benefits and other options for treatment, the patient has consented to  Procedure(s): ESOPHAGOGASTRODUODENOSCOPY (EGD) WITH PROPOFOL (N/A) as a surgical intervention.  The patient's history has been reviewed, patient examined, no change in status, stable for surgery.  I have reviewed the patient's chart and labs.  Questions were answered to the patient's satisfaction.     Silvano Rusk

## 2020-05-22 NOTE — Progress Notes (Signed)
PT Cancellation Note  Patient Details Name: [name] MRN: [id number] DOB: 07-08-1950   Cancelled Treatment:    Reason Eval/Treat Not Completed: Patient at procedure or test/unavailable (endo).  Wyona Almas, PT, DPT Acute Rehabilitation Services Pager [phone] Office [phone]    Deno Etienne 05/22/2020, 1:53 PM

## 2020-05-22 NOTE — Anesthesia Procedure Notes (Signed)
Procedure Name: MAC Date/Time: 05/22/2020 2:30 PM Performed by: Barrington Ellison, CRNA Pre-anesthesia Checklist: Patient identified, Emergency Drugs available, Suction available, Patient being monitored and Timeout performed Patient Re-evaluated:Patient Re-evaluated prior to induction Oxygen Delivery Method: Nasal cannula

## 2020-05-22 NOTE — Progress Notes (Signed)
PROGRESS NOTE    [name]  LEX:[id number] DOB: 1951/01/18 DOA: 05/13/2020 PCP: Burnard Bunting, MD    Brief Narrative:  69 year old with past medical history significant for peripheral vascular disease a status post right transmetatarsal amputation, and non Hodgkin lymphoma, type 2 diabetes mellitus, hypertension, hyperlipidemia, chronic systolic congestive heart failure, A. fib on Coumadin, history of pericardial tamponade status post window, OSA not on CPAP, ESRD on hemodialysis TTS who presented from general surgery office with persistent abdominal pain over the past 2 or 3 weeks.  Patient report pain as intermittent, not worse after eating.  Patient denies any fever chills night sweats, nausea vomiting.  Evaluation in the ED vitals are stable, potassium 4.3, chloride 96, CO2 28, glucose 130, BUN 52, creatinine 9.6, glucose 130, lipase 43.  Bilirubin 0.8.  White blood cell 5.8, hemoglobin 11.6, platelets 154.  COVID-19 influenza negative.  Right upper quadrant ultrasound with large solitary gallstone without evidence of cholecystitis, CBD 10 mm consistent with mild dilation, increase echogenicity hepatic parenchyma, possible right nephrolithiasis.  GI, general surgery were consulted.  Hospitalist service consulted for admission and further evaluation and management.  12/22-plan for EGD today  Consultants:   Surgery, nephrology, GI  Procedures:   Antimicrobials:       Subjective: Has no complaints.  Denies abdominal pain, nausea, or vomiting  Objective: Vitals:   05/21/20 1231 05/21/20 1743 05/21/20 2202 05/22/20 0428  BP: 106/60 132/80 (!) 147/71 123/69  Pulse: (!) 101 97 74 75  Resp: 18 20 18 17   Temp: 97.6 F (36.4 C) 98.7 F (37.1 C) 98.4 F (36.9 C) 99.2 F (37.3 C)  TempSrc: Oral Oral  Oral  SpO2: 97% 91% 91% 92%  Weight:      Height:        Intake/Output Summary (Last 24 hours) at 05/22/2020 0843 Last data filed at 05/21/2020 1157 Gross per 24 hour   Intake 315 ml  Output 1702 ml  Net -1387 ml   Filed Weights   05/18/20 2119 05/19/20 2110 05/20/20 2125  Weight: 115.8 kg 122.7 kg 117.9 kg    Examination:  General exam: Appears calm and comfortable  Respiratory system: Clear to auscultation. Respiratory effort normal. Cardiovascular system: S1 & S2 heard, RRR. No JVD, murmurs, rubs, gallops or clicks. No pedal edema. Gastrointestinal system: Abdomen is nondistended, soft and nontender. Normal bowel sounds heard. Central nervous system: Alert and oriented. No focal neurological deficits. Extremities: No edema Skin: Warm dry Psychiatry: Judgement and insight appear normal. Mood & affect appropriate.     Data Reviewed: I have personally reviewed following labs and imaging studies  CBC: Recent Labs  Lab 05/19/20 0138 05/20/20 0442 05/20/20 1431 05/20/20 1934 05/21/20 0345 05/22/20 0331  WBC 11.7* 12.6* 13.0*  --  11.7* 11.6*  HGB 12.4* 10.4* 9.0* 8.7* 8.3* 9.2*  HCT 38.2* 32.0* 26.7* 26.2* 23.5* 28.0*  MCV 93.6 93.6 92.4  --  90.4 91.5  PLT 144* 135* 128*  --  89* 449   Basic Metabolic Panel: Recent Labs  Lab 05/16/20 1048 05/17/20 0224 05/18/20 0853 05/19/20 0138 05/20/20 1134 05/21/20 0901  NA 136 136 137 138 132* 130*  K 4.1 4.0 4.1 4.6 4.1 4.2  CL 94* 98 98 96* 92* 93*  CO2 25 23  --  24 23 19*  GLUCOSE 111* 86 111* 141* 154* 96  BUN 37* 26* 43* 25* 80* 102*  CREATININE 9.14* 7.06* 8.90* 6.93* 10.01* 11.84*  CALCIUM 8.1* 8.1*  --  8.3* 7.4*  7.6*  PHOS 4.8*  --   --   --  4.6 4.6   GFR: Estimated Creatinine Clearance: 8.4 mL/min (A) (by C-G formula based on SCr of 11.84 mg/dL (H)). Liver Function Tests: Recent Labs  Lab 05/16/20 1048 05/17/20 0224 05/19/20 0138 05/20/20 1134 05/21/20 0901  AST  --  26 47*  --   --   ALT  --  32 41  --   --   ALKPHOS  --  139* 146*  --   --   BILITOT  --  1.3* 1.1  --   --   PROT  --  6.9 7.1  --   --   ALBUMIN 3.1* 3.1* 3.2* 2.4* 2.5*   No results for  input(s): LIPASE, AMYLASE in the last 168 hours. No results for input(s): AMMONIA in the last 168 hours. Coagulation Profile: Recent Labs  Lab 05/18/20 0227 05/19/20 0138 05/20/20 0442 05/21/20 0530 05/22/20 0331  INR 1.4* 1.4* 1.9* 2.7* 3.0*   Cardiac Enzymes: No results for input(s): CKTOTAL, CKMB, CKMBINDEX, TROPONINI in the last 168 hours. BNP (last 3 results) No results for input(s): PROBNP in the last 8760 hours. HbA1C: No results for input(s): HGBA1C in the last 72 hours. CBG: Recent Labs  Lab 05/21/20 0634 05/21/20 1230 05/21/20 1641 05/21/20 2202 05/22/20 0651  GLUCAP 103* 79 154* 147* 110*   Lipid Profile: No results for input(s): CHOL, HDL, LDLCALC, TRIG, CHOLHDL, LDLDIRECT in the last 72 hours. Thyroid Function Tests: No results for input(s): TSH, T4TOTAL, FREET4, T3FREE, THYROIDAB in the last 72 hours. Anemia Panel: No results for input(s): VITAMINB12, FOLATE, FERRITIN, TIBC, IRON, RETICCTPCT in the last 72 hours. Sepsis Labs: Recent Labs  Lab 05/20/20 1134 05/20/20 1431  LATICACIDVEN 1.5 1.5    Recent Results (from the past 240 hour(s))  Resp Panel by RT-PCR (Flu A&B, Covid) Nasopharyngeal Swab     Status: None   Collection Time: 05/13/20  4:07 PM   Specimen: Nasopharyngeal Swab; Nasopharyngeal(NP) swabs in vial transport medium  Result Value Ref Range Status   SARS Coronavirus 2 by RT PCR NEGATIVE NEGATIVE Final    Comment: (NOTE) SARS-CoV-2 target nucleic acids are NOT DETECTED.  The SARS-CoV-2 RNA is generally detectable in upper respiratory specimens during the acute phase of infection. The lowest concentration of SARS-CoV-2 viral copies this assay can detect is 138 copies/mL. A negative result does not preclude SARS-Cov-2 infection and should not be used as the sole basis for treatment or other patient management decisions. A negative result may occur with  improper specimen collection/handling, submission of specimen other than  nasopharyngeal swab, presence of viral mutation(s) within the areas targeted by this assay, and inadequate number of viral copies(<138 copies/mL). A negative result must be combined with clinical observations, patient history, and epidemiological information. The expected result is Negative.  Fact Sheet for Patients:  EntrepreneurPulse.com.au  Fact Sheet for Healthcare Providers:  IncredibleEmployment.be  This test is no t yet approved or cleared by the [place] FDA and  has been authorized for detection and/or diagnosis of SARS-CoV-2 by FDA under an Emergency Use Authorization (EUA). This EUA will remain  in effect (meaning this test can be used) for the duration of the COVID-19 declaration under Section 564(b)(1) of the Act, 21 U.S.C.section 360bbb-3(b)(1), unless the authorization is terminated  or revoked sooner.       Influenza A by PCR NEGATIVE NEGATIVE Final   Influenza B by PCR NEGATIVE NEGATIVE Final    Comment: (NOTE) The  Xpert Xpress SARS-CoV-2/FLU/RSV plus assay is intended as an aid in the diagnosis of influenza from Nasopharyngeal swab specimens and should not be used as a sole basis for treatment. Nasal washings and aspirates are unacceptable for Xpert Xpress SARS-CoV-2/FLU/RSV testing.  Fact Sheet for Patients: EntrepreneurPulse.com.au  Fact Sheet for Healthcare Providers: IncredibleEmployment.be  This test is not yet approved or cleared by the [place] FDA and has been authorized for detection and/or diagnosis of SARS-CoV-2 by FDA under an Emergency Use Authorization (EUA). This EUA will remain in effect (meaning this test can be used) for the duration of the COVID-19 declaration under Section 564(b)(1) of the Act, 21 U.S.C. section 360bbb-3(b)(1), unless the authorization is terminated or revoked.  Performed at [facility], [place] [address], [place], [place] [address]    MRSA PCR Screening     Status: None   Collection Time: 05/14/20  3:18 PM   Specimen: Nasopharyngeal  Result Value Ref Range Status   MRSA by PCR NEGATIVE NEGATIVE Final    Comment:        The GeneXpert MRSA Assay (FDA approved for NASAL specimens only), is one component of a comprehensive MRSA colonization surveillance program. It is not intended to diagnose MRSA infection nor to guide or monitor treatment for MRSA infections. Performed at [facility], [place] [address], [place], [place] [address]   Surgical pcr screen     Status: None   Collection Time: 05/18/20  2:11 AM   Specimen: Nasal Mucosa; Nasal Swab  Result Value Ref Range Status   MRSA, PCR NEGATIVE NEGATIVE Final   Staphylococcus aureus NEGATIVE NEGATIVE Final    Comment: (NOTE) The Xpert SA Assay (FDA approved for NASAL specimens in patients 65 years of age and older), is one component of a comprehensive surveillance program. It is not intended to diagnose infection nor to guide or monitor treatment. Performed at [facility], [place] [address], [place], [place] [address]          Radiology Studies: CT ABDOMEN PELVIS WO CONTRAST  Result Date: 05/20/2020 CLINICAL DATA:  69 year old male with concern for retroperitoneal hematoma. EXAM: CT ABDOMEN AND PELVIS WITHOUT CONTRAST TECHNIQUE: Multidetector CT imaging of the abdomen and pelvis was performed following the standard protocol without IV contrast. COMPARISON:  CT abdomen pelvis dated 05/13/2020. FINDINGS: Evaluation of this exam is limited in the absence of intravenous contrast. Lower chest: Bilateral confluent pulmonary consolidation may represent atelectasis but concerning for pneumonia. Clinical correlation is recommended. There is coronary vascular calcification. No intra-abdominal free air or free fluid. Hepatobiliary: The liver is unremarkable. No intrahepatic biliary dilatation. Cholecystectomy. There is a 4.3 x 2.4 cm somewhat complex and  slightly higher attenuating fluid collection along the cholecystectomy bed which may represent postsurgical seroma. Developing abscess or biloma is not excluded. Clinical correlation and follow-up as clinically indicated. No retained calcified stone noted in the central CBD. Pancreas: Unremarkable. No pancreatic ductal dilatation or surrounding inflammatory changes. Spleen: Normal in size without focal abnormality. Adrenals/Urinary Tract: The adrenal glands unremarkable. Moderate bilateral renal parenchyma atrophy. There is no hydronephrosis or nephrolithiasis on either side. Extensive renal vascular calcifications. The visualized ureters appear unremarkable. The urinary bladder is collapsed. Stomach/Bowel: There is no bowel obstruction or active inflammation. The appendix is normal. Vascular/Lymphatic: Advanced aortoiliac atherosclerotic disease as well as atherosclerotic calcification of the mesenteric vasculature. An infrarenal IVC filter is noted. No portal venous gas. There is no adenopathy. Reproductive: The prostate and seminal vesicles are grossly unremarkable.  No pelvic masses Other: A small lipoma in the left ileus psoas muscle anterior to the hip. Small fat containing left paraumbilical hernia. Right upper quadrant surgical incision with small amount of fluid and pockets of air. No loculated collection. Musculoskeletal: Degenerative changes of the spine. No acute osseous pathology. IMPRESSION: 1. Status post cholecystectomy with a small somewhat complex collection along the cholecystectomy bed, likely postsurgical seroma. Clinical correlation and follow-up as clinically indicated. 2. Bilateral confluent pulmonary consolidation may represent atelectasis or pneumonia. 3. No bowel obstruction. Normal appendix. 4. Aortic Atherosclerosis (ICD10-I70.0). Electronically Signed   By: Anner Crete [name]   On: 05/20/2020 17:47        Scheduled Meds: . (feeding supplement) PROSource Plus  30 mL Oral BID BM   . sodium chloride   Intravenous Once  . Chlorhexidine Gluconate Cloth  6 each Topical Q0600  . cinacalcet  30 mg Oral BID WC  . doxercalciferol  5 mcg Intravenous Q T,Th,Sa-HD  . feeding supplement (PROSource TF)  45 mL Per Tube BID  . insulin aspart  0-6 Units Subcutaneous TID WC  . [START ON 05/23/2020] iron polysaccharides  150 mg Oral Daily  . midodrine  10 mg Oral TID WC  . multivitamin  1 tablet Oral QHS  . pantoprazole (PROTONIX) IV  40 mg Intravenous Q12H  . phytonadione  10 mg Oral Once  . sevelamer carbonate  800 mg Oral TID WC   Continuous Infusions:  Assessment & Plan:   Principal Problem:   Cholelithiasis without obstruction Active Problems:   Hyperlipidemia   ESRD (end stage renal disease) (HCC)   Systolic heart failure (HCC)   Hypotension   Class 1 obesity due to excess calories with body mass index (BMI) of 33.0 to 33.9 in adult   Choledocholithiasis   Upper GI bleed   Blood loss anemia   1-Choledocholithiasis: -Patient presented to the ED from general surgery office with persistent abdominal pain over the last 2 or 3 weeks. Patient was found to have CBD ductal dilation with a gallstone within the gallbladder on  right upper quadrant ultrasound. -CT renal was done.if with choledocholithiasis with a small stone distal common bile duct measuring up to 8 mm with biliary ductal dilation, mild gallbladder distention and intraluminal gallstone with pericholecystic inflammation -[place], GI and general surgery following. -Underwent ERCP 12/17: Gastritis, status post biopsy, the major papilla appeared to be prominent edematous, filling defect consistent with a stone and sludge were seen on the cholangiogram.  The entire main bile duct was severely dilated.  Choledocholithiasis was found.  Complete removal was accomplished by biliary sphincterotomy and balloon drugs. -Underwent cholecystectomy on 05/18/2020 12/22- tolerating diet. Plan for EGD today  2-Chronic  Systolic Congestive Heart Failure, nonischemic cardiomyopathy, essential hypertension History of left ventricular ejection fraction 30 to 35% with improvement to 50 to 55% in 2020. Patient was evaluated by cardiology on 05/13/2020 for preoperative evaluation.  No indication for further testing at this time. 12/22-Not on ACE or ARB for beta-blockers at home due to history of hypotension   3-Chronic Hypotension; recurrent Hypotension /Anemia Midodrine was on hold because he was having  elevated BP.  Now again with Hypotension. Gave IV bolus. Lactic acid normal.  On midodrine Had dark stool, Hg progressively declined. plan for EGD today. Gi following 1 unit PRBC on 12/21.  Concerned for Retroperotoneal bleed- CT abdomen only showed seroma, will hold heparin overnight, his INR is 1.9 close to be therapeutic.    4-Paroxysmal A. fib/flutter: On  anticoagulation Coumadin on hold due for EGD  Once cleared will start anticoagulation with heparin drip and Coumadin     5-ESRD on hemodialysis: Continue with dialysis TTS. 12/22-dialysis in a.m. Nephrology following   History of non-Hodgkin lymphoma status post chemo in remission, history of sarcomatoid tumor scalp: Follow-up with oncology as an outpatient.  HTN; started on BP medications this admission. Monitor.  Hold Norvasc , he has now Hypotension.    DVT prophylaxis: SCD Code Status: Full Family Communication: None at bedside  Status is: Inpatient  Remains inpatient appropriate because:Inpatient level of care appropriate due to severity of illness   Dispo:  Patient From: Home  Planned Disposition: Home  Expected discharge date: 05/23/20  Medically stable for discharge: No, getting EGD today,r/o Upper GIB             LOS: 9 days   Time spent: 35 minutes with more than 50% on [place], MD [facility] Pager [phone]  If 7PM-7AM, please contact night-coverage 05/22/2020, 8:43 AM

## 2020-05-22 NOTE — Anesthesia Preprocedure Evaluation (Addendum)
Anesthesia Evaluation  Patient identified by MRN, date of birth, ID band Patient awake    Reviewed: Allergy & Precautions, NPO status , Patient's Chart, lab work & pertinent test results  Airway Mallampati: I  TM Distance: >3 FB Neck ROM: Full    Dental  (+) Edentulous Upper, Dental Advisory Given   Pulmonary sleep apnea , Patient abstained from smoking.,    Pulmonary exam normal breath sounds clear to auscultation       Cardiovascular hypertension, + Peripheral Vascular Disease, +CHF and + DVT  Normal cardiovascular exam+ dysrhythmias Atrial Fibrillation  Rhythm:Regular Rate:Normal  TTE 2020 1. Left ventricular ejection fraction, by visual estimation, is 50 to  55%. The left ventricle has normal function. There is moderately increased  left ventricular hypertrophy.  2. Elevated left atrial and left ventricular end-diastolic pressures.  3. Left ventricular diastolic parameters are consistent with Grade II  diastolic dysfunction (pseudonormalization).  4. Global right ventricle has mildly reduced systolic function.The right  ventricular size is normal. No increase in right ventricular wall  thickness.  5. Left atrial size was moderately dilated.  6. Right atrial size was normal.  7. The mitral valve is normal in structure. Mild mitral valve  regurgitation. No evidence of mitral stenosis.  8. The tricuspid valve is normal in structure. Tricuspid valve  regurgitation is mild.  9. The aortic valve is normal in structure. Aortic valve regurgitation is  not visualized. No evidence of aortic valve sclerosis or stenosis.  10. There is Moderate thickening of the aortic valve.  11. The pulmonic valve was normal in structure. Pulmonic valve  regurgitation is not visualized.  12. Aneurysm of the ascending aorta, measuring 40 mm.  13. Moderately elevated pulmonary artery systolic pressure.  14. The tricuspid regurgitant velocity  is 3.06 m/s, and with an assumed  right atrial pressure of 10 mmHg, the estimated right ventricular systolic  pressure is moderately elevated at 47.3 mmHg.  15. The inferior vena cava is normal in size with greater than 50%  respiratory variability, suggesting right atrial pressure of 3 mmHg.  16. Since the last study on 01/14/2018 LVEF has increased from 30-35% to   [facility] 2018  Widely patent coronary arteries with luminal irregularities in the LAD and right coronary.  Severe left ventricular systolic dysfunction with global wall motion abnormality and an EF less than 25%.  Hemostasis achieved with Perclose in the right femoral.     Neuro/Psych    GI/Hepatic GERD  ,  Endo/Other  diabetes, Type 2, Insulin Dependent  Renal/GU ESRF and DialysisRenal disease (Cr 11.84, K 4.2, dialysis TTS)     Musculoskeletal  (+) Arthritis ,   Abdominal   Peds  Hematology  (+) Blood dyscrasia (on coumadin, Hgb 9.2), anemia , Non-hodgkin's lymphoma   Anesthesia Other Findings UGIB  Reproductive/Obstetrics                            Anesthesia Physical Anesthesia Plan  ASA: III  Anesthesia Plan: MAC   Post-op Pain Management:    Induction: Intravenous  PONV Risk Score and Plan: 1 and Propofol infusion and Treatment may vary due to age or medical condition  Airway Management Planned: Natural Airway  Additional Equipment:   Intra-op Plan:   Post-operative Plan:   Informed Consent: I have reviewed the patients History and Physical, chart, labs and discussed the procedure including the risks, benefits and alternatives for the proposed anesthesia with the patient or  authorized representative who has indicated his/her understanding and acceptance.     Dental advisory given  Plan Discussed with: CRNA  Anesthesia Plan Comments:         Anesthesia Quick Evaluation

## 2020-05-22 NOTE — Transfer of Care (Signed)
Immediate Anesthesia Transfer of Care Note  Patient: [name]  Procedure(s) Performed: ESOPHAGOGASTRODUODENOSCOPY (EGD) WITH PROPOFOL (N/A )  Patient Location: Endoscopy Unit  Anesthesia Type:MAC  Level of Consciousness: drowsy  Airway & Oxygen Therapy: Patient Spontanous Breathing and Patient connected to nasal cannula oxygen  Post-op Assessment: Report given to RN  Post vital signs: Reviewed and stable  Last Vitals:  Vitals Value Taken Time  BP 120/53 05/22/20 1504  Temp    Pulse 52 05/22/20 1505  Resp 18 05/22/20 1505  SpO2 100 % 05/22/20 1505  Vitals shown include unvalidated device data.  Last Pain:  Vitals:   05/22/20 1318  TempSrc: Temporal  PainSc: 0-No pain      Patients Stated Pain Goal: 0 (15/94/70 7615)  Complications: No complications documented.

## 2020-05-22 NOTE — Progress Notes (Addendum)
[facility] Progress Note   Subjective: Seen leaving to go to endo. HGB ^ 9.2 today. No C/Os. HD tomorrow on schedule.   Objective Vitals:   05/21/20 1743 05/21/20 2202 05/22/20 0428 05/22/20 0919  BP: 132/80 (!) 147/71 123/69 118/75  Pulse: 97 74 75 94  Resp: 20 18 17 20   Temp: 98.7 F (37.1 C) 98.4 F (36.9 C) 99.2 F (37.3 C) 97.9 F (36.6 C)  TempSrc: Oral  Oral Oral  SpO2: 91% 91% 92% 93%  Weight:      Height:       Physical Exam General:WN, WD older male in NAD Heart:S1,S2 no M/R/G Lungs:CTAB Abdomen:Obese, tenderness around lap chole incisions. Active BS Extremities:No LE edema. Dialysis Access:R AVF +T/B  Additional Objective Labs: Basic Metabolic Panel: Recent Labs  Lab 05/16/20 1048 05/17/20 0224 05/19/20 0138 05/20/20 1134 05/21/20 0901  NA 136   < > 138 132* 130*  K 4.1   < > 4.6 4.1 4.2  CL 94*   < > 96* 92* 93*  CO2 25   < > 24 23 19*  GLUCOSE 111*   < > 141* 154* 96  BUN 37*   < > 25* 80* 102*  CREATININE 9.14*   < > 6.93* 10.01* 11.84*  CALCIUM 8.1*   < > 8.3* 7.4* 7.6*  PHOS 4.8*  --   --  4.6 4.6   < > = values in this interval not displayed.   Liver Function Tests: Recent Labs  Lab 05/17/20 0224 05/19/20 0138 05/20/20 1134 05/21/20 0901  AST 26 47*  --   --   ALT 32 41  --   --   ALKPHOS 139* 146*  --   --   BILITOT 1.3* 1.1  --   --   PROT 6.9 7.1  --   --   ALBUMIN 3.1* 3.2* 2.4* 2.5*   No results for input(s): LIPASE, AMYLASE in the last 168 hours. CBC: Recent Labs  Lab 05/19/20 0138 05/20/20 0442 05/20/20 1431 05/20/20 1934 05/21/20 0345 05/22/20 0331  WBC 11.7* 12.6* 13.0*  --  11.7* 11.6*  HGB 12.4* 10.4* 9.0* 8.7* 8.3* 9.2*  HCT 38.2* 32.0* 26.7* 26.2* 23.5* 28.0*  MCV 93.6 93.6 92.4  --  90.4 91.5  PLT 144* 135* 128*  --  89* 167   Blood Culture    Component Value Date/Time   SDES FLUID PERICARDIAL 01/10/2018 1015   SPECREQUEST B 01/10/2018 1015   CULT  01/10/2018 1015    NO GROWTH  3 DAYS Performed at [facility], [place] [address] [place], [place] [address]    REPTSTATUS 01/13/2018 FINAL 01/10/2018 1015    Cardiac Enzymes: No results for input(s): CKTOTAL, CKMB, CKMBINDEX, TROPONINI in the last 168 hours. CBG: Recent Labs  Lab 05/21/20 1230 05/21/20 1641 05/21/20 2202 05/22/20 0651 05/22/20 1129  GLUCAP 79 154* 147* 110* 102*   Iron Studies: No results for input(s): IRON, TIBC, TRANSFERRIN, FERRITIN in the last 72 hours. @lablastinr3 @ Studies/Results: CT ABDOMEN PELVIS WO CONTRAST  Result Date: 05/20/2020 CLINICAL DATA:  69 year old male with concern for retroperitoneal hematoma. EXAM: CT ABDOMEN AND PELVIS WITHOUT CONTRAST TECHNIQUE: Multidetector CT imaging of the abdomen and pelvis was performed following the standard protocol without IV contrast. COMPARISON:  CT abdomen pelvis dated 05/13/2020. FINDINGS: Evaluation of this exam is limited in the absence of intravenous contrast. Lower chest: Bilateral confluent pulmonary consolidation may represent atelectasis but concerning for pneumonia. Clinical correlation is recommended. There is  coronary vascular calcification. No intra-abdominal free air or free fluid. Hepatobiliary: The liver is unremarkable. No intrahepatic biliary dilatation. Cholecystectomy. There is a 4.3 x 2.4 cm somewhat complex and slightly higher attenuating fluid collection along the cholecystectomy bed which may represent postsurgical seroma. Developing abscess or biloma is not excluded. Clinical correlation and follow-up as clinically indicated. No retained calcified stone noted in the central CBD. Pancreas: Unremarkable. No pancreatic ductal dilatation or surrounding inflammatory changes. Spleen: Normal in size without focal abnormality. Adrenals/Urinary Tract: The adrenal glands unremarkable. Moderate bilateral renal parenchyma atrophy. There is no hydronephrosis or nephrolithiasis on either side. Extensive renal vascular calcifications.  The visualized ureters appear unremarkable. The urinary bladder is collapsed. Stomach/Bowel: There is no bowel obstruction or active inflammation. The appendix is normal. Vascular/Lymphatic: Advanced aortoiliac atherosclerotic disease as well as atherosclerotic calcification of the mesenteric vasculature. An infrarenal IVC filter is noted. No portal venous gas. There is no adenopathy. Reproductive: The prostate and seminal vesicles are grossly unremarkable. No pelvic masses Other: A small lipoma in the left ileus psoas muscle anterior to the hip. Small fat containing left paraumbilical hernia. Right upper quadrant surgical incision with small amount of fluid and pockets of air. No loculated collection. Musculoskeletal: Degenerative changes of the spine. No acute osseous pathology. IMPRESSION: 1. Status post cholecystectomy with a small somewhat complex collection along the cholecystectomy bed, likely postsurgical seroma. Clinical correlation and follow-up as clinically indicated. 2. Bilateral confluent pulmonary consolidation may represent atelectasis or pneumonia. 3. No bowel obstruction. Normal appendix. 4. Aortic Atherosclerosis (ICD10-I70.0). Electronically Signed   By: Anner Crete [name]   On: 05/20/2020 17:47   Medications:  . (feeding supplement) PROSource Plus  30 mL Oral BID BM  . sodium chloride   Intravenous Once  . Chlorhexidine Gluconate Cloth  6 each Topical Q0600  . cinacalcet  30 mg Oral BID WC  . doxercalciferol  5 mcg Intravenous Q T,Th,Sa-HD  . feeding supplement (PROSource TF)  45 mL Per Tube BID  . insulin aspart  0-6 Units Subcutaneous TID WC  . [START ON 05/23/2020] iron polysaccharides  150 mg Oral Daily  . midodrine  10 mg Oral TID WC  . multivitamin  1 tablet Oral QHS  . pantoprazole (PROTONIX) IV  40 mg Intravenous Q12H  . sevelamer carbonate  800 mg Oral TID WC     Dialysis Orders: [facility] on TTS 4:15 hr 180NRe 450/800119.5 kg,2K, 2.25CaUFP 4AVF No  heparin Hectorol 101mcg IV TIWlast PTH 252, 245(decrease to 27mcgs this hospitalization)  Assessment/Plan:. 1. Choledocholithiasis- ERCPcompleted12/17and cholecystectomy12/18.Pain well controlled. Cleared for d/c from surgery standpoint. 2. GIB: Having black stools 05/20/2020. Coumadin on hold.GI consulted. Going for EGD today.  3. ESRD-HD T,Th,S. Next HD 05/23/2020 today on schedule. K+ 4.2.  No heparin with HD.  4. Hypotension/volume-Recent issues with hypotension.  HD 12/21 net UF 1.8 no recent weights! Increased midodrine, BP stable.  Lower EDW on DC. UF as tolerated.  5. Anemia -Hgb12.4  12/19 progressively declining-now with black stools. 1 unit PRBCs 12/21 HGB 9.2 today. Concerned for retroperitoneal bled. CT of ab/pelvis showed seroma. GI consulted for black stools. Endo today.  6. Metabolic bone disease -C Ca 8.7. PO4 at goal.Continue renvela and sensipar.  7. DM type II-Per primary 8. Nutrition -Albumin low. Add protein supps, renal vit. 8.A fib -Warfarin has been resumed post op.  Lelar Farewell H. Suman Trivedi NP-C 05/22/2020, 1:06 PM  [employer] [phone]

## 2020-05-22 NOTE — Op Note (Addendum)
[facility] Patient Name: [name] Procedure Date : 05/22/2020 MRN: [id number] Attending MD: Gatha Mayer , MD Date of Birth: 01/22/1951 CSN: [id number] Age: 69 Admit Type: Outpatient Procedure:                Upper GI endoscopy Indications:              Melena, Suspected upper gastrointestinal bleeding Providers:                Gatha Mayer, MD, Clyde Lundborg, RN, Elspeth Cho Tech., Technician, Sampson Si, CRNA Referring MD:              Medicines:                Propofol per Anesthesia, Monitored Anesthesia Care Complications:            No immediate complications. Estimated Blood Loss:     Estimated blood loss: none. Procedure:                Pre-Anesthesia Assessment:                           - Prior to the procedure, a History and Physical                            was performed, and patient medications and                            allergies were reviewed. The patient's tolerance of                            previous anesthesia was also reviewed. The risks                            and benefits of the procedure and the sedation                            options and risks were discussed with the patient.                            All questions were answered, and informed consent                            was obtained. Prior Anticoagulants: The patient                            last took Coumadin (warfarin) 2 days prior to the                            procedure. ASA Grade Assessment: III - A patient                            with severe systemic disease. After reviewing the  risks and benefits, the patient was deemed in                            satisfactory condition to undergo the procedure.                           After obtaining informed consent, the endoscope was                            passed under direct vision. Throughout the                            procedure, the  patient's blood pressure, pulse, and                            oxygen saturations were monitored continuously. The                            [id number]-Q180V ([id number]) Olympus duodenoscope was                            introduced through the mouth, and advanced to the                            second part of duodenum. The GIF-H190 ([id number])                            Olympus gastroscope was introduced through the                            mouth, and advanced to the. The upper GI endoscopy                            was accomplished without difficulty. The patient                            tolerated the procedure well. Scope In: Scope Out: Findings:      There was evidence of a post-sphinterotomy ulcer previous surgical       anastomosis in the major papilla. This was characterized by ulceration.       Clean based no bleeding stigmata      One non-bleeding tiny post biospy gastric ulcer with no stigmata of       bleeding was found in the gastric antrum. The lesion was 3 mm in largest       dimension.      The exam was otherwise without abnormality.      The cardia and gastric fundus were normal on retroflexion. Impression:               - Post-sphinterotomy ulcer previous surgical                            anastomosis, characterized by ulceration was found  in the duodenum.                           - Non-bleeding gastric ulcer with no stigmata of                            bleeding. tiny post biopsy ulcer                           - The examination was otherwise normal.                           - No specimens collected.                           POST-SPHINCTEROTOMY HEMORRHAGE IN SETTING OF                            SUPRATHERAPEUTIC INR FROM WARFARIN (? ABX RELATED)-                            RESOLVED Recommendation:           - Return patient to hospital ward for ongoing care.                           - Resume previous diet.                           -  Continue present medications.                           - remain off warfarin for now - recheck INR - if                            needs anticoagulation immediately would do heparin                            - the ulcer is clean based so it appears bleeding                            is over so ok to aim for a low-range therapeutic INR                           keep on a PPI X 1 MONTH or longer                           I updated his son [name] by phone Procedure Code(s):        --- Professional ---                           [id number], Esophagogastroduodenoscopy, flexible,                            transoral; diagnostic, including collection of  specimen(s) by brushing or washing, when performed                            (separate procedure) Diagnosis Code(s):        --- Professional ---                           Z98.0, Intestinal bypass and anastomosis status                           K25.9, Gastric ulcer, unspecified as acute or                            chronic, without hemorrhage or perforation                           K92.1, Melena (includes Hematochezia) CPT copyright 2019 [employer] Association. All rights reserved. The codes documented in this report are preliminary and upon coder review may  be revised to meet current compliance requirements. Gatha Mayer, MD 05/22/2020 3:13:33 PM This report has been signed electronically. Number of Addenda: 0

## 2020-05-22 NOTE — Anesthesia Postprocedure Evaluation (Signed)
Anesthesia Post Note  Patient: [name]  Procedure(s) Performed: ESOPHAGOGASTRODUODENOSCOPY (EGD) WITH PROPOFOL (N/A )     Patient location during evaluation: PACU Anesthesia Type: MAC Level of consciousness: awake and alert Pain management: pain level controlled Vital Signs Assessment: post-procedure vital signs reviewed and stable Respiratory status: spontaneous breathing Cardiovascular status: stable Anesthetic complications: no   No complications documented.  Last Vitals:  Vitals:   05/22/20 1525 05/22/20 1544  BP: (!) 159/78 (!) 151/82  Pulse: 86 92  Resp: 19 18  Temp:  36.6 C  SpO2: 98% 97%    Last Pain:  Vitals:   05/22/20 1544  TempSrc: Oral  PainSc:                  Nolon Nations

## 2020-05-23 DIAGNOSIS — I4891 Unspecified atrial fibrillation: Secondary | ICD-10-CM

## 2020-05-23 DIAGNOSIS — I4892 Unspecified atrial flutter: Secondary | ICD-10-CM

## 2020-05-23 LAB — PROTIME-INR
INR: 2.1 — ABNORMAL HIGH (ref 0.8–1.2)
Prothrombin Time: 22.8 seconds — ABNORMAL HIGH (ref 11.4–15.2)

## 2020-05-23 LAB — CBC
HCT: 28.4 % — ABNORMAL LOW (ref 39.0–52.0)
Hemoglobin: 10 g/dL — ABNORMAL LOW (ref 13.0–17.0)
MCH: 31.7 pg (ref 26.0–34.0)
MCHC: 35.2 g/dL (ref 30.0–36.0)
MCV: 90.2 fL (ref 80.0–100.0)
Platelets: 185 10*3/uL (ref 150–400)
RBC: 3.15 MIL/uL — ABNORMAL LOW (ref 4.22–5.81)
RDW: 15.9 % — ABNORMAL HIGH (ref 11.5–15.5)
WBC: 9.1 10*3/uL (ref 4.0–10.5)
nRBC: 0 % (ref 0.0–0.2)

## 2020-05-23 LAB — HEMOGLOBIN AND HEMATOCRIT, BLOOD
HCT: 27.2 % — ABNORMAL LOW (ref 39.0–52.0)
HCT: 28.6 % — ABNORMAL LOW (ref 39.0–52.0)
Hemoglobin: 9.1 g/dL — ABNORMAL LOW (ref 13.0–17.0)
Hemoglobin: 9.4 g/dL — ABNORMAL LOW (ref 13.0–17.0)

## 2020-05-23 LAB — GLUCOSE, CAPILLARY
Glucose-Capillary: 86 mg/dL (ref 70–99)
Glucose-Capillary: 94 mg/dL (ref 70–99)
Glucose-Capillary: 189 mg/dL — ABNORMAL HIGH (ref 70–99)
Glucose-Capillary: 97 mg/dL (ref 70–99)

## 2020-05-23 MED ORDER — MIDODRINE HCL 5 MG PO TABS
ORAL_TABLET | ORAL | Status: AC
  Filled 2020-05-23: qty 2

## 2020-05-23 MED ORDER — BOOST / RESOURCE BREEZE PO LIQD CUSTOM
1.0000 | Freq: Three times a day (TID) | ORAL | Status: DC
  Administered 2020-05-23: 1 via ORAL

## 2020-05-23 MED ORDER — DOXERCALCIFEROL 4 MCG/2ML IV SOLN
INTRAVENOUS | Status: AC
  Filled 2020-05-23: qty 4

## 2020-05-23 NOTE — Progress Notes (Signed)
Physical Therapy Treatment Patient Details Name: [name] MRN: [id number] DOB: 1950/06/18 Today's Date: 05/23/2020    History of Present Illness 69 yo male presenting with abdominal pain; choledocholithiasis, scheduled for GB evaluation on 1/10. PMH including OSA not on CPAP; PVD s/p R TMA; NHL; DM; HTN; HLD; chronic systolic CHF; afib/flutter; h/o pericardial tamponade s/p window; and ESRD on TTS HD.    PT Comments    Patient received in bed after HD, very pleasant and cooperative with PT. Able to mobilize well on a supervision level today with good safety awareness and tolerated gait training well even after HD session this afternoon, mildly fatigued but steady with RW. Left in bed with all needs met and RN aware of patient status. Remains appropriate for HHPT follow up.    Follow Up Recommendations  Home health PT;Supervision for mobility/OOB     Equipment Recommendations  Rolling walker with 5" wheels;3in1 (PT)    Recommendations for Other Services       Precautions / Restrictions Precautions Precautions: Fall Restrictions Weight Bearing Restrictions: No    Mobility  Bed Mobility Overal bed mobility: Needs Assistance Bed Mobility: Rolling;Sidelying to Sit Rolling: Supervision Sidelying to sit: Supervision       General bed mobility comments: increased time/increased effort but able to perform without physical assist  Transfers Overall transfer level: Needs assistance Equipment used: Rolling walker (2 wheeled) Transfers: Sit to/from Stand Sit to Stand: Supervision;From elevated surface         General transfer comment: elevated bed due to his height, able to perform transfers without extra time/increased effort without physical assist  Ambulation/Gait Ambulation/Gait assistance: Supervision Gait Distance (Feet): 150 Feet Assistive device: Rolling walker (2 wheeled) Gait Pattern/deviations: Step-through pattern;Decreased step length - right;Decreased  step length - left;Trunk flexed;Decreased stride length Gait velocity: decreased   General Gait Details: slow but steady with RW, able to walk entire gait distance without rest breaks and no increase in pain even after HD   Stairs             Wheelchair Mobility    Modified Rankin (Stroke Patients Only)       Balance Overall balance assessment: Needs assistance Sitting-balance support: No upper extremity supported;Feet supported Sitting balance-Leahy Scale: Good     Standing balance support: No upper extremity supported;During functional activity Standing balance-Leahy Scale: Fair Standing balance comment: reliant on BUE support                            Cognition Arousal/Alertness: Awake/alert Behavior During Therapy: WFL for tasks assessed/performed Overall Cognitive Status: Within Functional Limits for tasks assessed                                 General Comments: Requiring increased time at times but feel this is baseline. Very agreeable to therapy      Exercises      General Comments        Pertinent Vitals/Pain Pain Assessment: No/denies pain Pain Intervention(s): Limited activity within patient's tolerance;Monitored during session    Home Living                      Prior Function            PT Goals (current goals can now be found in the care plan section) Acute Rehab PT Goals Patient Stated Goal: Go  home PT Goal Formulation: With patient Time For Goal Achievement: 05/30/20 Potential to Achieve Goals: Good Progress towards PT goals: Progressing toward goals    Frequency    Min 3X/week      PT Plan Current plan remains appropriate    Co-evaluation              AM-PAC PT "6 Clicks" Mobility   Outcome Measure  Help needed turning from your back to your side while in a flat bed without using bedrails?: A Little Help needed moving from lying on your back to sitting on the side of a flat bed  without using bedrails?: A Little Help needed moving to and from a bed to a chair (including a wheelchair)?: A Little Help needed standing up from a chair using your arms (e.g., wheelchair or bedside chair)?: A Little Help needed to walk in hospital room?: A Little Help needed climbing 3-5 steps with a railing? : A Lot 6 Click Score: 17    End of Session   Activity Tolerance: Patient tolerated treatment well Patient left: in bed;with call bell/phone within reach Nurse Communication: Mobility status PT Visit Diagnosis: Unsteadiness on feet (R26.81);Difficulty in walking, not elsewhere classified (R26.2);Muscle weakness (generalized) (M62.81)     Time: 6789-3810 PT Time Calculation (min) (ACUTE ONLY): 10 min  Charges:  $Gait Training: 8-22 mins                     Windell Norfolk, DPT, PN1   Supplemental Physical Therapist [place]    Pager [phone] Acute Rehab Office [phone]

## 2020-05-23 NOTE — Progress Notes (Addendum)
[facility] Progress Note   Subjective: Seen on HD attempting Net UF 1.5 L. Says he wants to go home. Discussed with primary. Hopefully DC home today.   He will have HD today on regular schedule. Next HD will be 05/26/2020 on Holiday schedule.      Objective Vitals:   05/22/20 2101 05/23/20 0436 05/23/20 0850 05/23/20 0900  BP: (!) 160/88 (!) 146/75 131/75 139/77  Pulse: 88 91 92 91  Resp: 18 18 20    Temp: 98.2 F (36.8 C) 98.6 F (37 C) 98.3 F (36.8 C)   TempSrc: Oral Oral Oral   SpO2: 91% 95% 98%   Weight:   117.5 kg   Height:       Physical Exam General:WN, WD older male in NAD Heart:S1,S2 no M/R/G Lungs:CTAB Abdomen:Obese, lap chole incisions intact. Active BS Extremities:No LE edema. Dialysis Access:R AVF cannulated at present   Additional Objective Labs: Basic Metabolic Panel: Recent Labs  Lab 05/16/20 1048 05/17/20 0224 05/19/20 0138 05/20/20 1134 05/21/20 0901  NA 136   < > 138 132* 130*  K 4.1   < > 4.6 4.1 4.2  CL 94*   < > 96* 92* 93*  CO2 25   < > 24 23 19*  GLUCOSE 111*   < > 141* 154* 96  BUN 37*   < > 25* 80* 102*  CREATININE 9.14*   < > 6.93* 10.01* 11.84*  CALCIUM 8.1*   < > 8.3* 7.4* 7.6*  PHOS 4.8*  --   --  4.6 4.6   < > = values in this interval not displayed.   Liver Function Tests: Recent Labs  Lab 05/17/20 0224 05/19/20 0138 05/20/20 1134 05/21/20 0901  AST 26 47*  --   --   ALT 32 41  --   --   ALKPHOS 139* 146*  --   --   BILITOT 1.3* 1.1  --   --   PROT 6.9 7.1  --   --   ALBUMIN 3.1* 3.2* 2.4* 2.5*   No results for input(s): LIPASE, AMYLASE in the last 168 hours. CBC: Recent Labs  Lab 05/20/20 0442 05/20/20 1431 05/20/20 1934 05/21/20 0345 05/22/20 0331 05/22/20 1725 05/23/20 0145 05/23/20 0748  WBC 12.6* 13.0*  --  11.7* 11.6*  --  9.1  --   HGB 10.4* 9.0*   < > 8.3* 9.2* 9.9* 10.0* 9.1*  HCT 32.0* 26.7*   < > 23.5* 28.0* 30.1* 28.4* 27.2*  MCV 93.6 92.4  --  90.4 91.5  --  90.2  --    PLT 135* 128*  --  89* 167  --  185  --    < > = values in this interval not displayed.   Blood Culture    Component Value Date/Time   SDES FLUID PERICARDIAL 01/10/2018 1015   SPECREQUEST B 01/10/2018 1015   CULT  01/10/2018 1015    NO GROWTH 3 DAYS Performed at [facility], [place] [address] [place], [place] [address]    REPTSTATUS 01/13/2018 FINAL 01/10/2018 1015    Cardiac Enzymes: No results for input(s): CKTOTAL, CKMB, CKMBINDEX, TROPONINI in the last 168 hours. CBG: Recent Labs  Lab 05/21/20 2202 05/22/20 0651 05/22/20 1129 05/22/20 2102 05/23/20 0657  GLUCAP 147* 110* 102* 144* 86   Iron Studies: No results for input(s): IRON, TIBC, TRANSFERRIN, FERRITIN in the last 72 hours. @lablastinr3 @ Studies/Results: No results found. Medications:  . (feeding supplement) PROSource Plus  30 mL  Oral BID BM  . sodium chloride   Intravenous Once  . Chlorhexidine Gluconate Cloth  6 each Topical Q0600  . cinacalcet  30 mg Oral BID WC  . doxercalciferol  5 mcg Intravenous Q T,Th,Sa-HD  . feeding supplement (PROSource TF)  45 mL Per Tube BID  . insulin aspart  0-6 Units Subcutaneous TID WC  . iron polysaccharides  150 mg Oral Daily  . midodrine  10 mg Oral TID WC  . multivitamin  1 tablet Oral QHS  . pantoprazole (PROTONIX) IV  40 mg Intravenous Q12H  . sevelamer carbonate  800 mg Oral TID WC     Dialysis Orders: [facility] on TTS 4:15 hr 180NRe 450/800119.5 kg,2K, 2.25CaUFP 4AVF No heparin Hectorol 55mcg IV TIWlast PTH 252, 245(decrease to 9mcgs this hospitalization)  Assessment/Plan:. 1. Choledocholithiasis- ERCPcompleted12/17and cholecystectomy12/18.Pain well controlled. Cleared for d/c from surgery standpoint. 2. GIB: Having black stools 05/20/2020. Coumadin on hold.GI consulted.EGD done 12/22 . post-sphinterotomy ulcer previous surgical anastomosis in the major papilla with bleeding. Tiny non-bleeding gastric ulcer in antrum. Otherwise  exam normal.  3. ESRD-HD T,Th,S. Next HD today on schedule. K+ 4.2. No heparin with HD. Next HD 05/26/2020 at [facility] on [address].  4. Hypotension/volume-Recent issues with hypotension now improved. Lower EDW on DC.UF as tolerated.  5. Anemia -Hgb12.412/19 progressively declining-now with black stools. 1 unit PRBCs 12/21 HGB10.0 today.  6. Metabolic bone disease -Labs pending. Continue renvela and sensipar.  7. DM type II-Per primary 8. Nutrition -Albumin low. Add protein supps, renal vit. 8.A fib -Warfarin on hold D/T GIB. Per primary/phamacy.  Disposition: Stable for DC from Nephrology stand point.   Eyla Tallon H. Kamuela Magos NP-C 05/23/2020, 10:31 AM  [employer] [phone]    =

## 2020-05-23 NOTE — Progress Notes (Signed)
PROGRESS NOTE    [name]  PNT:[id number] DOB: 1951/03/27 DOA: 05/13/2020 PCP: Burnard Bunting, MD    Brief Narrative:  69 year old with past medical history significant for peripheral vascular disease a status post right transmetatarsal amputation, and non Hodgkin lymphoma, type 2 diabetes mellitus, hypertension, hyperlipidemia, chronic systolic congestive heart failure, A. fib on Coumadin, history of pericardial tamponade status post window, OSA not on CPAP, ESRD on hemodialysis TTS who presented from general surgery office with persistent abdominal pain over the past 2 or 3 weeks.  Patient report pain as intermittent, not worse after eating.  Patient denies any fever chills night sweats, nausea vomiting.  Evaluation in the ED vitals are stable, potassium 4.3, chloride 96, CO2 28, glucose 130, BUN 52, creatinine 9.6, glucose 130, lipase 43.  Bilirubin 0.8.  White blood cell 5.8, hemoglobin 11.6, platelets 154.  COVID-19 influenza negative.  Right upper quadrant ultrasound with large solitary gallstone without evidence of cholecystitis, CBD 10 mm consistent with mild dilation, increase echogenicity hepatic parenchyma, possible right nephrolithiasis.  GI, general surgery were consulted.  Hospitalist service consulted for admission and further evaluation and management.  12/22-plan for EGD today 12/23-h/h down , in HD  Consultants:   Surgery, nephrology, GI  Procedures:   Antimicrobials:       Subjective: Has no complaints of abd pain, n/v  Objective: Vitals:   05/23/20 1230 05/23/20 1305 05/23/20 1334 05/23/20 1613  BP: (!) 108/57 132/65 130/63 125/80  Pulse: 92 90 88 91  Resp: 18 18 18 18   Temp:  98.3 F (36.8 C) 98.6 F (37 C) 97.9 F (36.6 C)  TempSrc:  Oral Oral Oral  SpO2:  99% 98% 92%  Weight:  116 kg    Height:        Intake/Output Summary (Last 24 hours) at 05/23/2020 1629 Last data filed at 05/23/2020 1400 Gross per 24 hour  Intake 1490 ml  Output  1500 ml  Net -10 ml   Filed Weights   05/20/20 2125 05/23/20 0850 05/23/20 1305  Weight: 117.9 kg 117.5 kg 116 kg    Examination: Calm, in hemodialysis  CTA, no wheeze rales rhonchi's Regular S1-S2 no murmurs Soft benign positive bowel sounds No edema Mood and affect appropriate in the current setting    Data Reviewed: I have personally reviewed following labs and imaging studies  CBC: Recent Labs  Lab 05/20/20 0442 05/20/20 1431 05/20/20 1934 05/21/20 0345 05/22/20 0331 05/22/20 1725 05/23/20 0145 05/23/20 0748  WBC 12.6* 13.0*  --  11.7* 11.6*  --  9.1  --   HGB 10.4* 9.0*   < > 8.3* 9.2* 9.9* 10.0* 9.1*  HCT 32.0* 26.7*   < > 23.5* 28.0* 30.1* 28.4* 27.2*  MCV 93.6 92.4  --  90.4 91.5  --  90.2  --   PLT 135* 128*  --  89* 167  --  185  --    < > = values in this interval not displayed.   Basic Metabolic Panel: Recent Labs  Lab 05/17/20 0224 05/18/20 0853 05/19/20 0138 05/20/20 1134 05/21/20 0901  NA 136 137 138 132* 130*  K 4.0 4.1 4.6 4.1 4.2  CL 98 98 96* 92* 93*  CO2 23  --  24 23 19*  GLUCOSE 86 111* 141* 154* 96  BUN 26* 43* 25* 80* 102*  CREATININE 7.06* 8.90* 6.93* 10.01* 11.84*  CALCIUM 8.1*  --  8.3* 7.4* 7.6*  PHOS  --   --   --  4.6 4.6   GFR: Estimated Creatinine Clearance: 8.4 mL/min (A) (by C-G formula based on SCr of 11.84 mg/dL (H)). Liver Function Tests: Recent Labs  Lab 05/17/20 0224 05/19/20 0138 05/20/20 1134 05/21/20 0901  AST 26 47*  --   --   ALT 32 41  --   --   ALKPHOS 139* 146*  --   --   BILITOT 1.3* 1.1  --   --   PROT 6.9 7.1  --   --   ALBUMIN 3.1* 3.2* 2.4* 2.5*   No results for input(s): LIPASE, AMYLASE in the last 168 hours. No results for input(s): AMMONIA in the last 168 hours. Coagulation Profile: Recent Labs  Lab 05/20/20 0442 05/21/20 0530 05/22/20 0331 05/22/20 1725 05/23/20 0145  INR 1.9* 2.7* 3.0* 2.4* 2.1*   Cardiac Enzymes: No results for input(s): CKTOTAL, CKMB, CKMBINDEX, TROPONINI in  the last 168 hours. BNP (last 3 results) No results for input(s): PROBNP in the last 8760 hours. HbA1C: No results for input(s): HGBA1C in the last 72 hours. CBG: Recent Labs  Lab 05/22/20 1129 05/22/20 2102 05/23/20 0657 05/23/20 1406 05/23/20 1619  GLUCAP 102* 144* 86 94 189*   Lipid Profile: No results for input(s): CHOL, HDL, LDLCALC, TRIG, CHOLHDL, LDLDIRECT in the last 72 hours. Thyroid Function Tests: No results for input(s): TSH, T4TOTAL, FREET4, T3FREE, THYROIDAB in the last 72 hours. Anemia Panel: No results for input(s): VITAMINB12, FOLATE, FERRITIN, TIBC, IRON, RETICCTPCT in the last 72 hours. Sepsis Labs: Recent Labs  Lab 05/20/20 1134 05/20/20 1431  LATICACIDVEN 1.5 1.5    Recent Results (from the past 240 hour(s))  MRSA PCR Screening     Status: None   Collection Time: 05/14/20  3:18 PM   Specimen: Nasopharyngeal  Result Value Ref Range Status   MRSA by PCR NEGATIVE NEGATIVE Final    Comment:        The GeneXpert MRSA Assay (FDA approved for NASAL specimens only), is one component of a comprehensive MRSA colonization surveillance program. It is not intended to diagnose MRSA infection nor to guide or monitor treatment for MRSA infections. Performed at [facility], [place] [address], [place], [place] [address]   Surgical pcr screen     Status: None   Collection Time: 05/18/20  2:11 AM   Specimen: Nasal Mucosa; Nasal Swab  Result Value Ref Range Status   MRSA, PCR NEGATIVE NEGATIVE Final   Staphylococcus aureus NEGATIVE NEGATIVE Final    Comment: (NOTE) The Xpert SA Assay (FDA approved for NASAL specimens in patients 18 years of age and older), is one component of a comprehensive surveillance program. It is not intended to diagnose infection nor to guide or monitor treatment. Performed at [facility], [place] [address] [place], [place] [address]          Radiology Studies: No results found.      Scheduled Meds: . (feeding  supplement) PROSource Plus  30 mL Oral BID BM  . sodium chloride   Intravenous Once  . Chlorhexidine Gluconate Cloth  6 each Topical Q0600  . cinacalcet  30 mg Oral BID WC  . doxercalciferol  5 mcg Intravenous Q T,Th,Sa-HD  . feeding supplement  1 Container Oral TID BM  . insulin aspart  0-6 Units Subcutaneous TID WC  . iron polysaccharides  150 mg Oral Daily  . midodrine  10 mg Oral TID WC  . multivitamin  1 tablet Oral QHS  . pantoprazole (PROTONIX) IV  40 mg Intravenous Q12H  . sevelamer carbonate  800 mg Oral TID WC   Continuous Infusions:  Assessment & Plan:   Principal Problem:   Cholelithiasis without obstruction Active Problems:   Hyperlipidemia   ESRD (end stage renal disease) (HCC)   Systolic heart failure (HCC)   Hypotension   Class 1 obesity due to excess calories with body mass index (BMI) of 33.0 to 33.9 in adult   Choledocholithiasis   Upper GI bleed   Blood loss anemia   Atrial fibrillation and flutter (HCC)   Ulcer of sphincterotomy site post ERCP   1-Choledocholithiasis GIB: -Patient presented to the ED from general surgery office with persistent abdominal pain over the last 2 or 3 weeks. Patient was found to have CBD ductal dilation with a gallstone within the gallbladder on  right upper quadrant ultrasound. -CT renal was done.if with choledocholithiasis with a small stone distal common bile duct measuring up to 8 mm with biliary ductal dilation, mild gallbladder distention and intraluminal gallstone with pericholecystic inflammation -[place], GI and general surgery following. -Underwent ERCP 12/17: Gastritis, status post biopsy, the major papilla appeared to be prominent edematous, filling defect consistent with a stone and sludge were seen on the cholangiogram.  The entire main bile duct was severely dilated.  Choledocholithiasis was found.  Complete removal was accomplished by biliary sphincterotomy and balloon drugs. -Underwent cholecystectomy on  05/18/2020 12/23 EGD completed yesterday. Found with nonbleeding gastric ulcer with no stigmata of bleeding.  Tiny postbiopsy ulcer.  Post enterectomy also previous surgical anastomosis, characterized by ulceration was found in the duodenum. GI recommended remaining off of warfarin for now, recheck INR, if needs anticoagulation immediately would do heparin.  The ulcer is clean base so it appears bleeding is over so okay to inform will arrange therapeutic INR Keep on PPI for 1 month or longer Follow-up with GI as outpatient Monitor H&H Hg today dropped from 10.0 to 9.1:, will ck am cbc make sure stable  inr 2.1 today   2-Chronic Systolic Congestive Heart Failure, nonischemic cardiomyopathy, essential hypertension History of left ventricular ejection fraction 30 to 35% with improvement to 50 to 55% in 2020. Patient was evaluated by cardiology on 05/13/2020 for preoperative evaluation.  No indication for further testing at this time. 12/23-euvolemic on exam.  Not on a soft beta-blockers at home due to history of hypotension     3-Chronic Hypotension; recurrent Hypotension /Anemia Midodrine was on hold because he was having  elevated BP.  Now again with Hypotension. Gave IV bolus. Lactic acid normal.  On midodrine Had dark stool, Hg progressively declined. plan for EGD today. Gi following 1 unit PRBC on 12/21.  Concerned for Retroperotoneal bleed- CT abdomen only showed seroma, will hold heparin overnight, his INR is 1.9 close to be therapeutic.  12/23-status post EGD Please see #1 Hemoglobin dropped from 10>>>9.1 just today. Will need to monitor another day to make sure its stable.  4-Paroxysmal A. fib/flutter: On anticoagulation Hold coumadin for now.  Aim for low side inr as outpt.    5-ESRD on hemodialysis: Continue with dialysis TTS. Nephrology following HD today, next HD on Sunday12/26/21 as OP, as sat. Is holiday    History of non-Hodgkin lymphoma status post chemo in  remission, history of sarcomatoid tumor scalp: Follow-up with oncology as an outpatient.  HTN; started on BP medications this admission. Monitor.  Hold Norvasc , he has now Hypotension.    DVT prophylaxis: SCD Code Status: Full Family Communication: None at bedside  Status is: Inpatient  Remains inpatient appropriate because:Inpatient level of care appropriate due to severity of illness   Dispo:  Patient From: Home  Planned Disposition: Home  Expected discharge date: 1 day. 05/24/20  Medically stable for discharge: No,h/h dropped this am again, will need to make sure cbc is stable.             LOS: 10 days   Time spent: 35 minutes with more than 50% on [place], MD [facility] Pager [phone]  If 7PM-7AM, please contact night-coverage 05/23/2020, 4:29 PM

## 2020-05-23 NOTE — TOC Transition Note (Signed)
Transition of Care [facility]) - CM/SW Discharge Note   Patient Details  Name: [name] MRN: [id number] Date of Birth: 1950-08-02  Transition of Care [facility]) CM/SW Contact:  Bartholomew Crews, RN Phone Number: [phone] 05/23/2020, 11:55 AM   Clinical Narrative:     Anticipate thet patient may transition home today following hemodialysis. Spoke with Malachy Mood at [facility]. HH RN, PT orders in place. Per nephrology note pt to have hemodialyis outpatient on Sunday this weekend instead of usual Saturday d/t holiday schedule. TOC following for transition needs.   Final next level of care: [place] Barriers to Discharge: No Barriers Identified   Patient Goals and CMS Choice Patient states their goals for this hospitalization and ongoing recovery are:: return home CMS Medicare.gov Compare Post Acute Care list provided to:: Patient Choice offered to / list presented to : Patient  Discharge Placement                       Discharge Plan and Services In-house Referral: [facility] Discharge Planning Services: CM Consult Post Acute Care Choice: Home Health                    HH Arranged: RN,PT [place] Agency: [place] Date [facility] Agency Contacted: 05/23/20 Time HH Agency Contacted: [phone] Representative spoke with at [place]: [place] Determinants of Health ([place]) Interventions     Readmission Risk Interventions No flowsheet data found.

## 2020-05-23 NOTE — Progress Notes (Signed)
Initial Nutrition Assessment  DOCUMENTATION CODES:   Obesity unspecified  INTERVENTION:   Boost Breeze po TID, each supplement provides 250 kcal and 9 grams of protein  Continue 8ml Prosource Plus po BID, each supplement provides 100 kcals and 15 grams of protein  Continue rena-vit daily  If pt does not discharge and diet is advanced, recommend discontinuing Boost Breeze and ordering Nepro Shake po BID, each supplement provides 425 kcal and 19 grams protein   NUTRITION DIAGNOSIS:   Inadequate oral intake related to acute illness (cholelithiasis with choledocholithiasis) as evidenced by other (comment) (NPO/CL status).    GOAL:   Patient will meet greater than or equal to 90% of their needs    MONITOR:   PO intake,Supplement acceptance,Diet advancement,Weight trends,Labs,I & O's  REASON FOR ASSESSMENT:   NPO/Clear Liquid Diet    ASSESSMENT:   Pt admitted with cholelithiasis without obstruction. PMH includes OSA not on CPAP, PVD s/p R TMA, NHL, DM, HTN, HLD, CHF, Afib, h/o pericardial tamponade s/p window, and ESRD on HD.  12/17 ERCP for removal of choledocholithiasis, gastritis, s/p biopsy 12/18 s/p cholecystectomy 12/22 EGD revealed post-sphinterotomy ulcer previous surgical anastomosis in the major papilla with bleeding and tiny non-bleeding gastric ulcer in antrum  Pt unavailable at time of RD visit. Pt in HD.  Per H&P, pt presented with complaints of persistent abdominal pain for 2-3 weeks PTA. Pt had imaging on 11/21 which showed cholelithiasis with choledocholithiasis, multiple distal CBD stones and biliary duct dilatation. Pt was referred to surgery with initial appointment in January. CT upon admission revealed similar findings, but with improved biliary duct dilatation. Pt denied N/V at time of admission and denied pain getting worse after eating. Pt reported pain just "comes and goes." GI, Nephrology, and General Surgery were consulted.   Per Nephrology, pt  is stating he wants to go home today. Pt has been cleared for d/c from surgery standpoint. Nephrology has also declared pt stable for d/c and plans to discuss with primary team. Nephrology hopeful for pt's d/c today. Case Management noted that pt is anticipated to d/c after HD. However, per GI, pt is scheduled for repeat EGD this afternoon with side view scope to look at the ampulla and treat any high risk pathology for bleeding.  Pt has been mostly NPO/CL since admission. Pt briefly on renal diet from 12/14-12/17 and again from 12/19-12/20. Pt's intake was 15-100% x 4 recorded meals (66% average intake). Pt noted to have eaten 75% of clear liquid tray last night. Will order oral nutrition supplements to provide pt with additional kcals/protein. Will also provide recommendations for supplements once diet is advanced in case pt does not discharge.  Pt under EDW. To be lowered upon d/c per Nephrology.  EDW 119.5 kg Current wt 117.5 kg Last HD 12/21, net UF 1721ml  No UOP documented.   Labs: CBGs 86-144 Medications: 54ml prosource plus po BID, sensipar, hectorol, ss novolog TID w/ meals, niferex, rena-vit, renvela  NUTRITION - FOCUSED PHYSICAL EXAM:  Unable to perform at this time; will attempt at follow-up.  Diet Order:   Diet Order            Diet clear liquid Room service appropriate? Yes; Fluid consistency: Thin  Diet effective now                 EDUCATION NEEDS:   No education needs have been identified at this time  Skin:  Skin Assessment: Skin Integrity Issues: Skin Integrity Issues:: Incisions Incisions: abdomen  Last BM:  05/21/20  Height:   Ht Readings from Last 1 Encounters:  05/14/20 6' 3.5" (1.918 m)    Weight:   Wt Readings from Last 1 Encounters:  05/23/20 116 kg    Ideal Body Weight:  90.45 kg  BMI:  Body mass index is 31.54 kg/m.  Estimated Nutritional Needs:   Kcal:  2400-2600  Protein:  150-170 grams  Fluid:  1L + UOP    Larkin Ina, MS, RD, LDN RD pager number and weekend/on-call pager number located in [place].

## 2020-05-24 LAB — HEMOGLOBIN AND HEMATOCRIT, BLOOD
HCT: 27.9 % — ABNORMAL LOW (ref 39.0–52.0)
Hemoglobin: 9 g/dL — ABNORMAL LOW (ref 13.0–17.0)

## 2020-05-24 LAB — PROTIME-INR
INR: 1.7 — ABNORMAL HIGH (ref 0.8–1.2)
Prothrombin Time: 19.3 seconds — ABNORMAL HIGH (ref 11.4–15.2)

## 2020-05-24 LAB — GLUCOSE, CAPILLARY
Glucose-Capillary: 86 mg/dL (ref 70–99)
Glucose-Capillary: 91 mg/dL (ref 70–99)

## 2020-05-24 MED ORDER — DOXERCALCIFEROL 4 MCG/2ML IV SOLN: 5.0000 ug | INTRAVENOUS | Status: AC

## 2020-05-24 MED ORDER — MIDODRINE HCL 10 MG PO TABS: 10.0000 mg | ORAL_TABLET | Freq: Three times a day (TID) | ORAL | 1 refills | Status: AC

## 2020-05-24 MED ORDER — WARFARIN SODIUM 1 MG PO TABS: 2.0000 mg | ORAL_TABLET | Freq: Every day | ORAL | 0 refills | Status: DC

## 2020-05-24 MED ORDER — PANTOPRAZOLE SODIUM 40 MG PO TBEC: 40.0000 mg | DELAYED_RELEASE_TABLET | Freq: Two times a day (BID) | ORAL | 1 refills | Status: AC

## 2020-05-24 NOTE — Progress Notes (Signed)
[facility] Progress Note   Subjective: REALLY going home today. DC has been postponed several times. He is very happy. Next HD 05/26/2020 on Holiday schedule at [facility].   Objective Vitals:   05/23/20 1613 05/23/20 2033 05/24/20 0515 05/24/20 1046  BP: 125/80 137/66 132/80 111/60  Pulse: 91 82 80 76  Resp: 18 17 17 20   Temp: 97.9 F (36.6 C) 98.4 F (36.9 C) 98.2 F (36.8 C) 98.6 F (37 C)  TempSrc: Oral   Oral  SpO2: 93% 91% 94% 94%  Weight:  116 kg    Height:       Physical Exam General:WN, WD older male in NAD Heart:S1,S2 no M/R/G Lungs:CTAB Abdomen:Obese, lap chole incisions intact. Active BS Extremities:No LE edema. Dialysis Access:R AVF +T/B   Additional Objective Labs: Basic Metabolic Panel: Recent Labs  Lab 05/19/20 0138 05/20/20 1134 05/21/20 0901  NA 138 132* 130*  K 4.6 4.1 4.2  CL 96* 92* 93*  CO2 24 23 19*  GLUCOSE 141* 154* 96  BUN 25* 80* 102*  CREATININE 6.93* 10.01* 11.84*  CALCIUM 8.3* 7.4* 7.6*  PHOS  --  4.6 4.6   Liver Function Tests: Recent Labs  Lab 05/19/20 0138 05/20/20 1134 05/21/20 0901  AST 47*  --   --   ALT 41  --   --   ALKPHOS 146*  --   --   BILITOT 1.1  --   --   PROT 7.1  --   --   ALBUMIN 3.2* 2.4* 2.5*   No results for input(s): LIPASE, AMYLASE in the last 168 hours. CBC: Recent Labs  Lab 05/20/20 0442 05/20/20 1431 05/20/20 1934 05/21/20 0345 05/22/20 0331 05/22/20 1725 05/23/20 0145 05/23/20 0748 05/23/20 1639 05/24/20 0346  WBC 12.6* 13.0*  --  11.7* 11.6*  --  9.1  --   --   --   HGB 10.4* 9.0*   < > 8.3* 9.2*   < > 10.0* 9.1* 9.4* 9.0*  HCT 32.0* 26.7*   < > 23.5* 28.0*   < > 28.4* 27.2* 28.6* 27.9*  MCV 93.6 92.4  --  90.4 91.5  --  90.2  --   --   --   PLT 135* 128*  --  89* 167  --  185  --   --   --    < > = values in this interval not displayed.   Blood Culture    Component Value Date/Time   SDES FLUID PERICARDIAL 01/10/2018 1015   SPECREQUEST B 01/10/2018  1015   CULT  01/10/2018 1015    NO GROWTH 3 DAYS Performed at [facility], [place] [address] [place], [place] [address]    REPTSTATUS 01/13/2018 FINAL 01/10/2018 1015    Cardiac Enzymes: No results for input(s): CKTOTAL, CKMB, CKMBINDEX, TROPONINI in the last 168 hours. CBG: Recent Labs  Lab 05/23/20 1406 05/23/20 1619 05/23/20 2032 05/24/20 0642 05/24/20 1115  GLUCAP 94 189* 97 86 91   Iron Studies: No results for input(s): IRON, TIBC, TRANSFERRIN, FERRITIN in the last 72 hours. @lablastinr3 @ Studies/Results: No results found. Medications:  . (feeding supplement) PROSource Plus  30 mL Oral BID BM  . sodium chloride   Intravenous Once  . Chlorhexidine Gluconate Cloth  6 each Topical Q0600  . cinacalcet  30 mg Oral BID WC  . doxercalciferol  5 mcg Intravenous Q T,Th,Sa-HD  . feeding supplement  1 Container Oral TID BM  . insulin aspart  0-6  Units Subcutaneous TID WC  . iron polysaccharides  150 mg Oral Daily  . midodrine  10 mg Oral TID WC  . multivitamin  1 tablet Oral QHS  . pantoprazole (PROTONIX) IV  40 mg Intravenous Q12H  . sevelamer carbonate  800 mg Oral TID WC     Dialysis Orders: [place] on TTS 4:15 hr 180NRe 450/800119.5 kg,2K, 2.25CaUFP 4AVF No heparin Hectorol 29mcg IV TIWlast PTH 252, 245(decrease to 57mcgs this hospitalization)  Assessment/Plan:. 1. Choledocholithiasis- ERCPcompleted12/17and cholecystectomy12/18.Pain well controlled. Cleared for d/c from surgery standpoint. 2. GIB: Having black stools 05/20/2020. Coumadin on hold.GI consulted.EGD done 12/22 .post-sphinterotomy ulcer previous surgical anastomosis in the major papilla with bleeding. Tiny non-bleeding gastric ulcer in antrum. Otherwise exam normal.  3. ESRD-HD T,Th,S.Next HD 05/26/2020 at [place] [facility] [address].  4. Hypotension/volume-Recent issues with hypotension now improved.Lower EDW on DC.UF as tolerated. 5. Anemia -Hgb12.412/19  progressively declining-now with black stools. 1 unit PRBCs12/21 HGB 9.0 today.  6. Metabolic bone disease -Labs at goal. Continue renvela and sensipar.  7. DM type II-Per primary 8. Nutrition -Albumin low. Add protein supps, renal vit. 8.A fib -Warfarin on hold D/T GIB. Per primary/phamacy.  Disposition: Stable for DC from Nephrology stand point.   Ski Polich H. Micah Galeno NP-C 05/24/2020, 11:53 AM  [employer] [phone]

## 2020-05-24 NOTE — Plan of Care (Signed)
  Problem: Health Behavior/Discharge Planning: Goal: Ability to manage health-related needs will improve Outcome: Adequate for Discharge   Problem: Clinical Measurements: Goal: Ability to maintain clinical measurements within normal limits will improve Outcome: Adequate for Discharge Goal: Will remain free from infection 05/24/2020 1056 by Dolores Hoose, RN Outcome: Adequate for Discharge 05/24/2020 0743 by Dolores Hoose, RN Outcome: Progressing Goal: Diagnostic test results will improve Outcome: Adequate for Discharge Goal: Respiratory complications will improve Outcome: Adequate for Discharge Goal: Cardiovascular complication will be avoided Outcome: Adequate for Discharge   Problem: Activity: Goal: Risk for activity intolerance will decrease 05/24/2020 1056 by Dolores Hoose, RN Outcome: Adequate for Discharge 05/24/2020 0743 by Dolores Hoose, RN Outcome: Progressing   Problem: Nutrition: Goal: Adequate nutrition will be maintained Outcome: Adequate for Discharge   Problem: Coping: Goal: Level of anxiety will decrease Outcome: Adequate for Discharge   Problem: Elimination: Goal: Will not experience complications related to bowel motility Outcome: Adequate for Discharge Goal: Will not experience complications related to urinary retention Outcome: Adequate for Discharge   Problem: Pain Managment: Goal: General experience of comfort will improve Outcome: Adequate for Discharge   Problem: Safety: Goal: Ability to remain free from injury will improve 05/24/2020 1056 by Dolores Hoose, RN Outcome: Adequate for Discharge 05/24/2020 0743 by Dolores Hoose, RN Outcome: Progressing   Problem: Skin Integrity: Goal: Risk for impaired skin integrity will decrease Outcome: Adequate for Discharge   Problem: Acute Rehab PT Goals(only PT should resolve) Goal: Pt Will Go Supine/Side To Sit Outcome: Adequate for Discharge Goal: Pt Will Go Sit To  Supine/Side Outcome: Adequate for Discharge Goal: Patient Will Transfer Sit To/From Stand Outcome: Adequate for Discharge Goal: Pt Will Transfer Bed To Chair/Chair To Bed Outcome: Adequate for Discharge Goal: Pt Will Ambulate Outcome: Adequate for Discharge   Problem: Education: Goal: Knowledge of disease and its progression will improve Outcome: Adequate for Discharge Goal: Individualized Educational Video(s) Outcome: Adequate for Discharge   Problem: Fluid Volume: Goal: Compliance with measures to maintain balanced fluid volume will improve Outcome: Adequate for Discharge   Problem: Health Behavior/Discharge Planning: Goal: Ability to manage health-related needs will improve Outcome: Adequate for Discharge   Problem: Nutritional: Goal: Ability to make healthy dietary choices will improve Outcome: Adequate for Discharge   Problem: Clinical Measurements: Goal: Complications related to the disease process, condition or treatment will be avoided or minimized Outcome: Adequate for Discharge   Problem: Inadequate Intake (NI-2.1) Goal: Food and/or nutrient delivery Description: Individualized approach for food/nutrient provision. Outcome: Adequate for Discharge

## 2020-05-24 NOTE — Plan of Care (Signed)
  Problem: Clinical Measurements: Goal: Will remain free from infection Outcome: Progressing   Problem: Activity: Goal: Risk for activity intolerance will decrease Outcome: Progressing   Problem: Safety: Goal: Ability to remain free from injury will improve Outcome: Progressing   

## 2020-05-24 NOTE — TOC Transition Note (Signed)
Transition of Care [facility]) - CM/SW Discharge Note   Patient Details  Name: [name] MRN: [id number] Date of Birth: Apr 14, 1951  Transition of Care [facility]) CM/SW Contact:  Bartholomew Crews, RN Phone Number: [phone] 05/24/2020, 12:22 PM   Clinical Narrative:     Patient to transition home today. Cheryl at [employer] notified of transition. No further TOC needs identified.   Final next level of care: [place] Barriers to Discharge: No Barriers Identified   Patient Goals and CMS Choice Patient states their goals for this hospitalization and ongoing recovery are:: return home CMS Medicare.gov Compare Post Acute Care list provided to:: Patient Choice offered to / list presented to : Patient  Discharge Placement                       Discharge Plan and Services In-house Referral: [facility] Discharge Planning Services: CM Consult Post Acute Care Choice: Home Health                    HH Arranged: RN,PT [place] Agency: [place] Date [facility] Agency Contacted: 05/23/20 Time HH Agency Contacted: 0110 Representative spoke with at [place]: [place] Determinants of Health ([place]) Interventions     Readmission Risk Interventions No flowsheet data found.

## 2020-05-24 NOTE — Discharge Summary (Signed)
[name] TSV:[id number] DOB: 1951/01/13 DOA: 05/13/2020  PCP: Burnard Bunting, MD  Admit date: 05/13/2020 Discharge date: 05/24/2020  Admitted From: Home Disposition: Home  Recommendations for Outpatient Follow-up:  1. Follow up with PCP in 1 week 2. Please obtain BMP/CBC in one week 3. Follow up with GI in one month 1. ESRD-HD T,Th,S.Next HD 05/26/2020 at [facility] on [place] [address].   Home Health:yes    Discharge Condition:Stable CODE STATUS:full  Diet recommendation: renal /carb modified  Brief/Interim Summary: Per HPI: [name] is a 69 y.o. male with medical history significant of OSA not on CPAP; PVD s/p R TMA; NHL; DM; HTN; HLD; chronic systolic CHF; afib/flutter; h/o pericardial tamponade s/p window; and ESRD on TTS HD presenting with choledocholithiasis, scheduled for GB evaluation on 1/10.  He has had RUQ pain for 2-3 weeks.   CT on 11/21 with gallstones, CBD 14 -> 10 mm.  Referred to Gen Surg and sent to ER for admission.  1-Choledocholithiasis GIB: -Patient presented to the ED from general surgery office with persistent abdominal pain over the last 2 or 3 weeks. Patient was found to have CBD ductal dilation with a gallstone within the gallbladder on right upper quadrant ultrasound. -CT renal was done.if with choledocholithiasis with a small stone distal common bile duct measuring up to 8 mm with biliary ductal dilation, mild gallbladder distention and intraluminal gallstone with pericholecystic inflammation -[place], GI and general surgery following. -Underwent ERCP 12/17: Gastritis, status post biopsy, the major papilla appeared to be prominent edematous, filling defect consistent with a stone and sludge were seen on the cholangiogram. The entire main bile duct was severely dilated. Choledocholithiasis was found. Complete removal was accomplished by biliary sphincterotomy and balloon drugs. -Underwent cholecystectomy on 05/18/2020 Then there was  concern for GIB, patients coumadin was held , underwent EGD on 12/23. Found with nonbleeding gastric ulcer with no stigmata of bleeding.  Tiny post-biopsy ulcer.  Post enterectomy also previous surgical anastomosis, characterized by ulceration was found in the duodenum. GI recommended remaining off of warfarin for yesterday, resume today but keep INR on low side. I did discuss this with the patient. Told him to start at 2mg  which is lower than home dose and ck INR by pcp on Monday as its the holiday over the weekend. GI recommended PPI for 1 month or longer Follow-up with GI as outpatient His hemoglobin remained stable this am.  2-Chronic Systolic Congestive Heart Failure, nonischemic cardiomyopathy History of left ventricular ejection fraction 30 to 35% with improvement to 50 to 55% in 2020. Patient was evaluated by cardiology on 05/13/2020 for preoperative evaluation. No indication for further testing at this time.  Not on a soft beta-blockers at home due to history of hypotension  On HD    3-ChronicHypotension;recurrent Hypotension /Anemia On midodrine 1 unit PRBC on 12/21.  Concerned for Retroperotoneal bleed- CT abdomen only showed seroma status post EGD-with results above  4-Paroxysmal A. fib/flutter:On anticoagulation Resume at lower dose , ck INR . Keep INR on low side.     5-ESRD on hemodialysis: Continue with dialysis TTS. Nephrology followed  next HD on Sunday12/26/21 as OP, as sat. Is holiday    History of non-Hodgkin lymphoma status post chemo in remission, history of sarcomatoid tumor scalp: Follow-up with oncology as an outpatient.        Discharge Diagnoses:  Principal Problem:   Cholelithiasis without obstruction Active Problems:   Hyperlipidemia   ESRD (end stage renal disease) (HCC)   Systolic  heart failure (HCC)   Hypotension   Class 1 obesity due to excess calories with body mass index (BMI) of 33.0 to 33.9 in adult    Choledocholithiasis   Upper GI bleed   Blood loss anemia   Atrial fibrillation and flutter (HCC)   Ulcer of sphincterotomy site post ERCP    Discharge Instructions  Discharge Instructions    Call MD for:  temperature >100.4   Complete by: As directed    Diet - low sodium heart healthy   Complete by: As directed    hemodialysis   Discharge instructions   Complete by: As directed    Start low dose coumadin tonight, at 2mg  , get your inr checked on Monday. Please keep INR on low side around 2.  Go to dialysis on sunday   Increase activity slowly   Complete by: As directed    No wound care   Complete by: As directed      Allergies as of 05/24/2020   No Known Allergies     Medication List    STOP taking these medications   insulin glargine 100 UNIT/ML injection Commonly known as: LANTUS     TAKE these medications   acetaminophen 500 MG tablet Commonly known as: TYLENOL Take 500-1,000 mg by mouth daily as needed (back pain).   cinacalcet 30 MG tablet Commonly known as: SENSIPAR Take 60 mg by mouth See admin instructions. Take one tablet (30 mg) by mouth twice daily - with lunch and supper   doxercalciferol 4 MCG/2ML injection Commonly known as: HECTOROL Inject 2.5 mLs (5 mcg total) into the vein Every Tuesday,Thursday,and Saturday with dialysis. Start taking on: May 25, 2020   insulin aspart 100 UNIT/ML injection Commonly known as: novoLOG Inject 1-9 Units into the skin 3 (three) times daily before meals. What changed: how much to take   lidocaine-prilocaine cream Commonly known as: EMLA Apply 1 application topically as needed (Apply small amount to access site 1-2 hours before dialysis. Cover with occlusive dressing (saran wrap)).   midodrine 10 MG tablet Commonly known as: PROAMATINE Take 1 tablet (10 mg total) by mouth 3 (three) times daily with meals. What changed:   medication strength  how much to take  additional instructions   pantoprazole  40 MG tablet Commonly known as: Protonix Take 1 tablet (40 mg total) by mouth 2 (two) times daily.   polysaccharide iron 150 MG capsule Generic drug: iron polysaccharides Take 1 capsule (150 mg total) by mouth daily.   RENA-VITE PO Take 1 tablet by mouth daily.   sevelamer carbonate 800 MG tablet Commonly known as: RENVELA Take 800 mg by mouth 3 (three) times daily with meals.   vitamin C 500 MG tablet Commonly known as: ASCORBIC ACID Take 500 mg by mouth daily.   Vitamin D3 125 MCG (5000 UT) Tabs Take 5,000 Units by mouth daily.   warfarin 1 MG tablet Commonly known as: Coumadin Take 2 tablets (2 mg total) by mouth daily for 14 days. Start coumadin 2mg , increase or decrease dose based on INR and instruction  Given by MD What changed:   medication strength  how much to take  when to take this  additional instructions   zinc sulfate 220 (50 Zn) MG capsule Take 220 mg by mouth daily.            Durable Medical Equipment  (From admission, onward)         Start     Ordered   05/17/20  0951  For home use only DME Walker rolling  Once       Question Answer Comment  Walker: With [place]   Patient needs a walker to treat with the following condition Balance disorder      05/17/20 0950   05/17/20 0951  For home use only DME 3 n 1  Once        05/17/20 0950          Follow-up Information    [facility] Follow up.   Why: the office will call to schedule physical therapy visits Contact information: [place] [address] [facility], [place]. Go on 06/06/2020.   Specialty: General Surgery Why: Your appointment is 01/06 at 2pm Please arrive 30 minutes prior to your appointment to check in and fill out paperwork. Bring photo ID and insurance information. Contact information: [address] [place] [phone]       Burnard Bunting, MD  Follow up in 1 week(s).   Specialty: Internal Medicine Contact information: [place] [address] [phone]        Yetta Flock, MD Follow up in 1 month(s).   Specialty: Gastroenterology Contact information: [place] [address] [place] [address] [phone]              No Known Allergies  Consultations:  Nephrology, GI, surgery   Procedures/Studies: CT ABDOMEN PELVIS WO CONTRAST  Result Date: 05/20/2020 CLINICAL DATA:  69 year old male with concern for retroperitoneal hematoma. EXAM: CT ABDOMEN AND PELVIS WITHOUT CONTRAST TECHNIQUE: Multidetector CT imaging of the abdomen and pelvis was performed following the standard protocol without IV contrast. COMPARISON:  CT abdomen pelvis dated 05/13/2020. FINDINGS: Evaluation of this exam is limited in the absence of intravenous contrast. Lower chest: Bilateral confluent pulmonary consolidation may represent atelectasis but concerning for pneumonia. Clinical correlation is recommended. There is coronary vascular calcification. No intra-abdominal free air or free fluid. Hepatobiliary: The liver is unremarkable. No intrahepatic biliary dilatation. Cholecystectomy. There is a 4.3 x 2.4 cm somewhat complex and slightly higher attenuating fluid collection along the cholecystectomy bed which may represent postsurgical seroma. Developing abscess or biloma is not excluded. Clinical correlation and follow-up as clinically indicated. No retained calcified stone noted in the central CBD. Pancreas: Unremarkable. No pancreatic ductal dilatation or surrounding inflammatory changes. Spleen: Normal in size without focal abnormality. Adrenals/Urinary Tract: The adrenal glands unremarkable. Moderate bilateral renal parenchyma atrophy. There is no hydronephrosis or nephrolithiasis on either side. Extensive renal vascular calcifications. The visualized ureters appear unremarkable. The urinary bladder is collapsed.  Stomach/Bowel: There is no bowel obstruction or active inflammation. The appendix is normal. Vascular/Lymphatic: Advanced aortoiliac atherosclerotic disease as well as atherosclerotic calcification of the mesenteric vasculature. An infrarenal IVC filter is noted. No portal venous gas. There is no adenopathy. Reproductive: The prostate and seminal vesicles are grossly unremarkable. No pelvic masses Other: A small lipoma in the left ileus psoas muscle anterior to the hip. Small fat containing left paraumbilical hernia. Right upper quadrant surgical incision with small amount of fluid and pockets of air. No loculated collection. Musculoskeletal: Degenerative changes of the spine. No acute osseous pathology. IMPRESSION: 1. Status post cholecystectomy with a small somewhat complex collection along the cholecystectomy bed, likely postsurgical seroma. Clinical correlation and follow-up as clinically indicated. 2. Bilateral confluent pulmonary consolidation may represent atelectasis or pneumonia. 3. No bowel obstruction.  Normal appendix. 4. Aortic Atherosclerosis (ICD10-I70.0). Electronically Signed   By: Anner Crete [name]   On: 05/20/2020 17:47   DG Cholangiogram Operative  Result Date: 05/18/2020 CLINICAL DATA:  Cholecystectomy for cholelithiasis and choledocholithiasis. EXAM: INTRAOPERATIVE CHOLANGIOGRAM TECHNIQUE: Cholangiographic images from the C-arm fluoroscopic device were submitted for interpretation post-operatively. Please see the procedural report for the amount of contrast and the fluoroscopy time utilized. COMPARISON:  Ultrasound and CT studies on 05/13/2020 FINDINGS: Intraoperative imaging with a C-arm demonstrates a dilated common bile duct without discrete filling defects. Contrast enters the duodenum. IVC filter present. IMPRESSION: Dilated common bile duct without discrete filling defects. Electronically Signed   By: Aletta Edouard [name]   On: 05/18/2020 13:45   DG ERCP BILIARY & PANCREATIC  DUCTS  Result Date: 05/17/2020 CLINICAL DATA:  69 year old male with a history of cholelithiasis/choledocholithiasis EXAM: ERCP TECHNIQUE: Multiple spot images obtained with the fluoroscopic device and submitted for interpretation post-procedure. FLUOROSCOPY TIME:  Fluoroscopy Time:  1 minutes 8 seconds COMPARISON:  CT 05/13/2020 FINDINGS: Limited intraoperative fluoroscopic spot images during ERCP. Initial image demonstrates endoscope projecting over the upper abdomen. IVC filter is present. Soft point there is cannulation of the ampulla with retrograde infusion of contrast partially opacifying the extrahepatic biliary system. Deployment of a balloon retrieval catheter with single rounded filling defect evident. Final image demonstrates partial evacuation of the contrast. IMPRESSION: Limited images during ERCP demonstrates treatment of choledocholithiasis with deployment of a balloon retrieval catheter. Please refer to the dictated operative report for full details of intraoperative findings and procedure. Electronically Signed   By: Corrie Mckusick D.O.   On: 05/17/2020 15:36   CT Renal Stone Study  Result Date: 05/13/2020 CLINICAL DATA:  Right hydronephrosis on right upper quadrant ultrasound. EXAM: CT ABDOMEN AND PELVIS WITHOUT CONTRAST TECHNIQUE: Multidetector CT imaging of the abdomen and pelvis was performed following the standard protocol without IV contrast. COMPARISON:  Abdominal ultrasound earlier today. Abdominal CT 04/19/2020. FINDINGS: Lower chest: Breathing motion artifact. Left basilar consolidation, slightly greater than typically seen with atelectasis. There is linear atelectasis in the right lower lobe and right middle lobe. Upper normal heart size with coronary artery calcifications. No pleural fluid. Hepatobiliary: Gallbladder distension. There is a dominant intraluminal gallstone measuring 3.4 cm. Additional small layering stones are seen. There is intra and extrahepatic biliary ductal  dilatation with at least 3 small stones in the distal common bile duct measuring at least 8 mm. Minimal Peri cholecystic fat stranding. No focal hepatic lesion. Pancreas: No ductal dilatation or inflammation. Spleen: Normal in size without focal abnormality. Adrenals/Urinary Tract: No adrenal nodule. No hydronephrosis, particularly on the right. There are renal vascular calcifications without definite renal calculi. Symmetric perinephric edema is chronic. Ureters are decompressed. Urinary bladder is completely empty. There is mild perivesicular edema. Stomach/Bowel: Patulous distal esophagus. Decompressed stomach. No small bowel dilatation or obstruction. No evidence of small bowel inflammation. Appendix not confidently visualized on the current exam. No evidence of appendicitis. Small volume of colonic stool. Mild distal colonic diverticulosis without diverticulitis. Vascular/Lymphatic: Advanced aortic and branch atherosclerosis. No aortic aneurysm. Infrarenal IVC filter in place. No abdominopelvic adenopathy. Reproductive: Prostate is unremarkable. Other: No ascites. No free air. Moderate-sized fat containing umbilical with small supraumbilical fat containing hernia. No bowel involvement or inflammation. Musculoskeletal: Stable chronic change at L5-L1 which may be postsurgical or degenerative. Vacuum phenomena at L2-L3. Prominent Schmorl's node inferior endplate of L3, unchanged. Generalized ground-glass density of the osseous structures consistent with renal osteodystrophy. 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 IMPRESSION: 1. Choledocholithiasis with small stones in the distal common bile duct measuring up to 8 mm and subsequent biliary ductal dilatation. Mild gallbladder distension and intraluminal gallstones. Mild pericholecystic inflammation. 2. No right hydronephrosis or stone. Symmetric perinephric edema which appears chronic. 3. Colonic diverticulosis without diverticulitis. 4.  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 5. Advanced atherosclerotic vascular disease. Aortic Atherosclerosis (ICD10-I70.0). Electronically Signed   By: Keith Rake [name]   On: 05/13/2020 17:00   US Abdomen Limited RUQ (LIVER/GB)  Result Date: 05/13/2020 CLINICAL DATA:  Cholelithiasis. EXAM: ULTRASOUND ABDOMEN LIMITED RIGHT UPPER QUADRANT COMPARISON:  April 19, 2020.  January 10, 2018. FINDINGS: Gallbladder: 2.8 cm gallstone is noted. No significant gallbladder wall thickening or pericholecystic fluid is noted. No sonographic Murphy's sign is noted. Common bile duct: Diameter: Measures 7 mm proximally and approximately 10 mm distally. Liver: No focal lesion identified. Mildly increased echogenicity of hepatic parenchyma is noted suggesting hepatic steatosis. Portal vein is patent on color Doppler imaging with normal direction of blood flow towards the liver. Other: Possible right nephrolithiasis is noted with mild right hydronephrosis. IMPRESSION: Large solitary gallstone is noted without definite evidence of cholecystitis. Common bile duct is within normal limits proximally, but measures approximately 10 mm distally consistent with mild dilatation. Correlation with liver function tests is recommended to rule out distal common bile duct obstruction. Increased echogenicity of hepatic parenchyma is noted suggesting hepatic steatosis. Possible right nephrolithiasis is noted with mild right hydronephrosis. Electronically Signed   By: Marijo Conception [name]   On: 05/13/2020 15:05       Subjective: Has no dizziness, shortness of breath, chest pain or lightheadedness  Discharge Exam: Vitals:   05/24/20 0515 05/24/20 1046  BP: 132/80 111/60  Pulse: 80 76  Resp: 17 20  Temp: 98.2 F (36.8 C) 98.6 F (37 C)  SpO2: 94% 94%   Vitals:   05/23/20 1613 05/23/20 2033 05/24/20 0515 05/24/20 1046  BP: 125/80 137/66 132/80 111/60  Pulse: 91 82 80 76  Resp: 18 17 17 20   Temp: 97.9  F (36.6 C) 98.4 F (36.9 C) 98.2 F (36.8 C) 98.6 F (37 C)  TempSrc: Oral   Oral  SpO2: 93% 91% 94% 94%  Weight:  116 kg    Height:        General: Pt is alert, awake, not in acute distress Cardiovascular: RRR, S1/S2 +, no rubs, no gallops Respiratory: CTA bilaterally, no wheezing, no rhonchi Abdominal: Soft, NT, ND, bowel sounds + Extremities: no edema, no cyanosis    The results of significant diagnostics from this hospitalization (including imaging, microbiology, ancillary and laboratory) are listed below for reference.     Microbiology: Recent Results (from the past 240 hour(s))  MRSA PCR Screening     Status: None   Collection Time: 05/14/20  3:18 PM   Specimen: Nasopharyngeal  Result Value Ref Range Status   MRSA by PCR NEGATIVE NEGATIVE Final    Comment:        The GeneXpert MRSA Assay (FDA approved for NASAL specimens only), is one component of a comprehensive MRSA colonization surveillance program. It is not intended to diagnose MRSA infection nor to guide or monitor treatment for MRSA infections. Performed at [facility], [place] [address] [place], [place] [address]   Surgical pcr screen     Status: None   Collection Time: 05/18/20  2:11 AM   Specimen: Nasal Mucosa; Nasal Swab  Result  Value Ref Range Status   MRSA, PCR NEGATIVE NEGATIVE Final   Staphylococcus aureus NEGATIVE NEGATIVE Final    Comment: (NOTE) The Xpert SA Assay (FDA approved for NASAL specimens in patients 68 years of age and older), is one component of a comprehensive surveillance program. It is not intended to diagnose infection nor to guide or monitor treatment. Performed at [facility], [place] [address] [place],  [address]      Labs: BNP (last 3 results) No results for input(s): BNP in the last 8760 hours. Basic Metabolic Panel: Recent Labs  Lab 05/18/20 0853 05/19/20 0138 05/20/20 1134 05/21/20 0901  NA 137 138 132* 130*  K 4.1 4.6 4.1 4.2  CL 98  96* 92* 93*  CO2  --  24 23 19*  GLUCOSE 111* 141* 154* 96  BUN 43* 25* 80* 102*  CREATININE 8.90* 6.93* 10.01* 11.84*  CALCIUM  --  8.3* 7.4* 7.6*  PHOS  --   --  4.6 4.6   Liver Function Tests: Recent Labs  Lab 05/19/20 0138 05/20/20 1134 05/21/20 0901  AST 47*  --   --   ALT 41  --   --   ALKPHOS 146*  --   --   BILITOT 1.1  --   --   PROT 7.1  --   --   ALBUMIN 3.2* 2.4* 2.5*   No results for input(s): LIPASE, AMYLASE in the last 168 hours. No results for input(s): AMMONIA in the last 168 hours. CBC: Recent Labs  Lab 05/20/20 0442 05/20/20 1431 05/20/20 1934 05/21/20 0345 05/22/20 0331 05/22/20 1725 05/23/20 0145 05/23/20 0748 05/23/20 1639 05/24/20 0346  WBC 12.6* 13.0*  --  11.7* 11.6*  --  9.1  --   --   --   HGB 10.4* 9.0*   < > 8.3* 9.2* 9.9* 10.0* 9.1* 9.4* 9.0*  HCT 32.0* 26.7*   < > 23.5* 28.0* 30.1* 28.4* 27.2* 28.6* 27.9*  MCV 93.6 92.4  --  90.4 91.5  --  90.2  --   --   --   PLT 135* 128*  --  89* 167  --  185  --   --   --    < > = values in this interval not displayed.   Cardiac Enzymes: No results for input(s): CKTOTAL, CKMB, CKMBINDEX, TROPONINI in the last 168 hours. BNP: Invalid input(s): POCBNP CBG: Recent Labs  Lab 05/23/20 1406 05/23/20 1619 05/23/20 2032 05/24/20 0642 05/24/20 1115  GLUCAP 94 189* 97 86 91   D-Dimer No results for input(s): DDIMER in the last 72 hours. Hgb A1c No results for input(s): HGBA1C in the last 72 hours. Lipid Profile No results for input(s): CHOL, HDL, LDLCALC, TRIG, CHOLHDL, LDLDIRECT in the last 72 hours. Thyroid function studies No results for input(s): TSH, T4TOTAL, T3FREE, THYROIDAB in the last 72 hours.  Invalid input(s): FREET3 Anemia work up No results for input(s): VITAMINB12, FOLATE, FERRITIN, TIBC, IRON, RETICCTPCT in the last 72 hours. Urinalysis    Component Value Date/Time   COLORURINE YELLOW 08/04/2013 0249   APPEARANCEUR CLEAR 08/04/2013 0249   LABSPEC 1.013 08/04/2013 0249    PHURINE 7.5 08/04/2013 0249   GLUCOSEU 500 (A) 08/04/2013 0249   HGBUR SMALL (A) 08/04/2013 0249   BILIRUBINUR NEGATIVE 08/04/2013 0249   KETONESUR NEGATIVE 08/04/2013 0249   PROTEINUR 100 (A) 08/04/2013 0249   UROBILINOGEN 0.2 08/04/2013 0249   NITRITE NEGATIVE 08/04/2013 0249   LEUKOCYTESUR NEGATIVE 08/04/2013 0249  Sepsis Labs Invalid input(s): PROCALCITONIN,  WBC,  LACTICIDVEN Microbiology Recent Results (from the past 240 hour(s))  MRSA PCR Screening     Status: None   Collection Time: 05/14/20  3:18 PM   Specimen: Nasopharyngeal  Result Value Ref Range Status   MRSA by PCR NEGATIVE NEGATIVE Final    Comment:        The GeneXpert MRSA Assay (FDA approved for NASAL specimens only), is one component of a comprehensive MRSA colonization surveillance program. It is not intended to diagnose MRSA infection nor to guide or monitor treatment for MRSA infections. Performed at [facility], [place] [address], [place], [place] [address]   Surgical pcr screen     Status: None   Collection Time: 05/18/20  2:11 AM   Specimen: Nasal Mucosa; Nasal Swab  Result Value Ref Range Status   MRSA, PCR NEGATIVE NEGATIVE Final   Staphylococcus aureus NEGATIVE NEGATIVE Final    Comment: (NOTE) The Xpert SA Assay (FDA approved for NASAL specimens in patients 64 years of age and older), is one component of a comprehensive surveillance program. It is not intended to diagnose infection nor to guide or monitor treatment. Performed at [facility], [place] [address], [place], [place] [address]      Time coordinating discharge: Over 30 minutes  SIGNED:   Nolberto Hanlon, MD  [facility] 05/24/2020, 12:12 PM Pager   If 7PM-7AM, please contact night-coverage [email or web address] Password [email or web address]

## 2020-05-24 NOTE — Progress Notes (Signed)
DISCHARGE NOTE HOME [name] to be discharged Home per MD order. Discussed prescriptions and follow up appointments with the patient. Prescriptions given to patient; medication list explained in detail. Patient verbalized understanding.  Skin clean, dry and intact without evidence of skin break down, no evidence of skin tears noted. IV catheter discontinued intact. Site without signs and symptoms of complications. Dressing and pressure applied. Pt denies pain at the site currently. No complaints noted.  Patient free of lines, drains, and wounds.   An After Visit Summary (AVS) was printed and given to the patient. Patient escorted via wheelchair, and discharged home via private auto.  Dolores Hoose, RN

## 2020-05-26 ENCOUNTER — Telehealth: Payer: Self-pay | Admitting: Nurse Practitioner

## 2020-05-26 DIAGNOSIS — N186 End stage renal disease: Secondary | ICD-10-CM | POA: Diagnosis not present

## 2020-05-26 DIAGNOSIS — D631 Anemia in chronic kidney disease: Secondary | ICD-10-CM | POA: Diagnosis not present

## 2020-05-26 DIAGNOSIS — K8020 Calculus of gallbladder without cholecystitis without obstruction: Secondary | ICD-10-CM | POA: Diagnosis not present

## 2020-05-26 DIAGNOSIS — Z992 Dependence on renal dialysis: Secondary | ICD-10-CM | POA: Diagnosis not present

## 2020-05-26 DIAGNOSIS — E1129 Type 2 diabetes mellitus with other diabetic kidney complication: Secondary | ICD-10-CM | POA: Diagnosis not present

## 2020-05-26 DIAGNOSIS — N2581 Secondary hyperparathyroidism of renal origin: Secondary | ICD-10-CM | POA: Diagnosis not present

## 2020-05-26 NOTE — Telephone Encounter (Signed)
Transition of care contact from inpatient facility  Date of Discharge: 05/24/2020 Date of Contact: 05/26/2020 Method of contact: Phone  Attempted to contact patient to discuss transition of care from inpatient admission. Patient did not answer the phone. Message was left on the patient's voicemail with call back number [phone].

## 2020-05-28 DIAGNOSIS — N186 End stage renal disease: Secondary | ICD-10-CM | POA: Diagnosis not present

## 2020-05-28 DIAGNOSIS — D631 Anemia in chronic kidney disease: Secondary | ICD-10-CM | POA: Diagnosis not present

## 2020-05-28 DIAGNOSIS — E1129 Type 2 diabetes mellitus with other diabetic kidney complication: Secondary | ICD-10-CM | POA: Diagnosis not present

## 2020-05-28 DIAGNOSIS — Z992 Dependence on renal dialysis: Secondary | ICD-10-CM | POA: Diagnosis not present

## 2020-05-28 DIAGNOSIS — N2581 Secondary hyperparathyroidism of renal origin: Secondary | ICD-10-CM | POA: Diagnosis not present

## 2020-05-29 DIAGNOSIS — I80202 Phlebitis and thrombophlebitis of unspecified deep vessels of left lower extremity: Secondary | ICD-10-CM | POA: Diagnosis not present

## 2020-05-29 DIAGNOSIS — D6869 Other thrombophilia: Secondary | ICD-10-CM | POA: Diagnosis not present

## 2020-05-29 DIAGNOSIS — Z7901 Long term (current) use of anticoagulants: Secondary | ICD-10-CM | POA: Diagnosis not present

## 2020-05-30 DIAGNOSIS — D631 Anemia in chronic kidney disease: Secondary | ICD-10-CM | POA: Diagnosis not present

## 2020-05-30 DIAGNOSIS — N2581 Secondary hyperparathyroidism of renal origin: Secondary | ICD-10-CM | POA: Diagnosis not present

## 2020-05-30 DIAGNOSIS — N186 End stage renal disease: Secondary | ICD-10-CM | POA: Diagnosis not present

## 2020-05-30 DIAGNOSIS — Z992 Dependence on renal dialysis: Secondary | ICD-10-CM | POA: Diagnosis not present

## 2020-05-30 DIAGNOSIS — I4891 Unspecified atrial fibrillation: Secondary | ICD-10-CM | POA: Diagnosis not present

## 2020-05-30 DIAGNOSIS — E1129 Type 2 diabetes mellitus with other diabetic kidney complication: Secondary | ICD-10-CM | POA: Diagnosis not present

## 2020-06-01 DIAGNOSIS — N186 End stage renal disease: Secondary | ICD-10-CM | POA: Diagnosis not present

## 2020-06-01 DIAGNOSIS — Z992 Dependence on renal dialysis: Secondary | ICD-10-CM | POA: Diagnosis not present

## 2020-06-01 DIAGNOSIS — E1129 Type 2 diabetes mellitus with other diabetic kidney complication: Secondary | ICD-10-CM | POA: Diagnosis not present

## 2020-06-02 DIAGNOSIS — N186 End stage renal disease: Secondary | ICD-10-CM | POA: Diagnosis not present

## 2020-06-02 DIAGNOSIS — N2581 Secondary hyperparathyroidism of renal origin: Secondary | ICD-10-CM | POA: Diagnosis not present

## 2020-06-02 DIAGNOSIS — Z992 Dependence on renal dialysis: Secondary | ICD-10-CM | POA: Diagnosis not present

## 2020-06-02 DIAGNOSIS — D509 Iron deficiency anemia, unspecified: Secondary | ICD-10-CM | POA: Diagnosis not present

## 2020-06-02 DIAGNOSIS — E1129 Type 2 diabetes mellitus with other diabetic kidney complication: Secondary | ICD-10-CM | POA: Diagnosis not present

## 2020-06-02 DIAGNOSIS — D631 Anemia in chronic kidney disease: Secondary | ICD-10-CM | POA: Diagnosis not present

## 2020-06-04 DIAGNOSIS — D509 Iron deficiency anemia, unspecified: Secondary | ICD-10-CM | POA: Diagnosis not present

## 2020-06-04 DIAGNOSIS — D631 Anemia in chronic kidney disease: Secondary | ICD-10-CM | POA: Diagnosis not present

## 2020-06-04 DIAGNOSIS — N186 End stage renal disease: Secondary | ICD-10-CM | POA: Diagnosis not present

## 2020-06-04 DIAGNOSIS — E1129 Type 2 diabetes mellitus with other diabetic kidney complication: Secondary | ICD-10-CM | POA: Diagnosis not present

## 2020-06-04 DIAGNOSIS — Z992 Dependence on renal dialysis: Secondary | ICD-10-CM | POA: Diagnosis not present

## 2020-06-04 DIAGNOSIS — N2581 Secondary hyperparathyroidism of renal origin: Secondary | ICD-10-CM | POA: Diagnosis not present

## 2020-06-05 DIAGNOSIS — I4891 Unspecified atrial fibrillation: Secondary | ICD-10-CM | POA: Diagnosis not present

## 2020-06-05 DIAGNOSIS — E1129 Type 2 diabetes mellitus with other diabetic kidney complication: Secondary | ICD-10-CM | POA: Diagnosis not present

## 2020-06-05 DIAGNOSIS — I80202 Phlebitis and thrombophlebitis of unspecified deep vessels of left lower extremity: Secondary | ICD-10-CM | POA: Diagnosis not present

## 2020-06-05 DIAGNOSIS — D6869 Other thrombophilia: Secondary | ICD-10-CM | POA: Diagnosis not present

## 2020-06-05 DIAGNOSIS — N186 End stage renal disease: Secondary | ICD-10-CM | POA: Diagnosis not present

## 2020-06-05 DIAGNOSIS — E1139 Type 2 diabetes mellitus with other diabetic ophthalmic complication: Secondary | ICD-10-CM | POA: Diagnosis not present

## 2020-06-05 DIAGNOSIS — Z7901 Long term (current) use of anticoagulants: Secondary | ICD-10-CM | POA: Diagnosis not present

## 2020-06-06 DIAGNOSIS — N186 End stage renal disease: Secondary | ICD-10-CM | POA: Diagnosis not present

## 2020-06-06 DIAGNOSIS — Z992 Dependence on renal dialysis: Secondary | ICD-10-CM | POA: Diagnosis not present

## 2020-06-06 DIAGNOSIS — N2581 Secondary hyperparathyroidism of renal origin: Secondary | ICD-10-CM | POA: Diagnosis not present

## 2020-06-06 DIAGNOSIS — D631 Anemia in chronic kidney disease: Secondary | ICD-10-CM | POA: Diagnosis not present

## 2020-06-06 DIAGNOSIS — E1129 Type 2 diabetes mellitus with other diabetic kidney complication: Secondary | ICD-10-CM | POA: Diagnosis not present

## 2020-06-06 DIAGNOSIS — D509 Iron deficiency anemia, unspecified: Secondary | ICD-10-CM | POA: Diagnosis not present

## 2020-06-08 DIAGNOSIS — N186 End stage renal disease: Secondary | ICD-10-CM | POA: Diagnosis not present

## 2020-06-08 DIAGNOSIS — D631 Anemia in chronic kidney disease: Secondary | ICD-10-CM | POA: Diagnosis not present

## 2020-06-08 DIAGNOSIS — Z992 Dependence on renal dialysis: Secondary | ICD-10-CM | POA: Diagnosis not present

## 2020-06-08 DIAGNOSIS — E1129 Type 2 diabetes mellitus with other diabetic kidney complication: Secondary | ICD-10-CM | POA: Diagnosis not present

## 2020-06-08 DIAGNOSIS — N2581 Secondary hyperparathyroidism of renal origin: Secondary | ICD-10-CM | POA: Diagnosis not present

## 2020-06-08 DIAGNOSIS — D509 Iron deficiency anemia, unspecified: Secondary | ICD-10-CM | POA: Diagnosis not present

## 2020-06-11 DIAGNOSIS — D509 Iron deficiency anemia, unspecified: Secondary | ICD-10-CM | POA: Diagnosis not present

## 2020-06-11 DIAGNOSIS — N186 End stage renal disease: Secondary | ICD-10-CM | POA: Diagnosis not present

## 2020-06-11 DIAGNOSIS — D631 Anemia in chronic kidney disease: Secondary | ICD-10-CM | POA: Diagnosis not present

## 2020-06-11 DIAGNOSIS — E1129 Type 2 diabetes mellitus with other diabetic kidney complication: Secondary | ICD-10-CM | POA: Diagnosis not present

## 2020-06-11 DIAGNOSIS — N2581 Secondary hyperparathyroidism of renal origin: Secondary | ICD-10-CM | POA: Diagnosis not present

## 2020-06-11 DIAGNOSIS — Z992 Dependence on renal dialysis: Secondary | ICD-10-CM | POA: Diagnosis not present

## 2020-06-12 DIAGNOSIS — I4891 Unspecified atrial fibrillation: Secondary | ICD-10-CM | POA: Diagnosis not present

## 2020-06-12 DIAGNOSIS — Z7901 Long term (current) use of anticoagulants: Secondary | ICD-10-CM | POA: Diagnosis not present

## 2020-06-13 DIAGNOSIS — D631 Anemia in chronic kidney disease: Secondary | ICD-10-CM | POA: Diagnosis not present

## 2020-06-13 DIAGNOSIS — D509 Iron deficiency anemia, unspecified: Secondary | ICD-10-CM | POA: Diagnosis not present

## 2020-06-13 DIAGNOSIS — N2581 Secondary hyperparathyroidism of renal origin: Secondary | ICD-10-CM | POA: Diagnosis not present

## 2020-06-13 DIAGNOSIS — N186 End stage renal disease: Secondary | ICD-10-CM | POA: Diagnosis not present

## 2020-06-13 DIAGNOSIS — Z992 Dependence on renal dialysis: Secondary | ICD-10-CM | POA: Diagnosis not present

## 2020-06-13 DIAGNOSIS — E1129 Type 2 diabetes mellitus with other diabetic kidney complication: Secondary | ICD-10-CM | POA: Diagnosis not present

## 2020-06-15 DIAGNOSIS — Z992 Dependence on renal dialysis: Secondary | ICD-10-CM | POA: Diagnosis not present

## 2020-06-15 DIAGNOSIS — E1129 Type 2 diabetes mellitus with other diabetic kidney complication: Secondary | ICD-10-CM | POA: Diagnosis not present

## 2020-06-15 DIAGNOSIS — N186 End stage renal disease: Secondary | ICD-10-CM | POA: Diagnosis not present

## 2020-06-15 DIAGNOSIS — D631 Anemia in chronic kidney disease: Secondary | ICD-10-CM | POA: Diagnosis not present

## 2020-06-15 DIAGNOSIS — N2581 Secondary hyperparathyroidism of renal origin: Secondary | ICD-10-CM | POA: Diagnosis not present

## 2020-06-15 DIAGNOSIS — D509 Iron deficiency anemia, unspecified: Secondary | ICD-10-CM | POA: Diagnosis not present

## 2020-06-18 DIAGNOSIS — N186 End stage renal disease: Secondary | ICD-10-CM | POA: Diagnosis not present

## 2020-06-18 DIAGNOSIS — Z992 Dependence on renal dialysis: Secondary | ICD-10-CM | POA: Diagnosis not present

## 2020-06-18 DIAGNOSIS — D631 Anemia in chronic kidney disease: Secondary | ICD-10-CM | POA: Diagnosis not present

## 2020-06-18 DIAGNOSIS — N2581 Secondary hyperparathyroidism of renal origin: Secondary | ICD-10-CM | POA: Diagnosis not present

## 2020-06-18 DIAGNOSIS — D509 Iron deficiency anemia, unspecified: Secondary | ICD-10-CM | POA: Diagnosis not present

## 2020-06-18 DIAGNOSIS — E1129 Type 2 diabetes mellitus with other diabetic kidney complication: Secondary | ICD-10-CM | POA: Diagnosis not present

## 2020-06-19 DIAGNOSIS — N186 End stage renal disease: Secondary | ICD-10-CM | POA: Diagnosis not present

## 2020-06-19 DIAGNOSIS — I871 Compression of vein: Secondary | ICD-10-CM | POA: Diagnosis not present

## 2020-06-19 DIAGNOSIS — Z992 Dependence on renal dialysis: Secondary | ICD-10-CM | POA: Diagnosis not present

## 2020-06-19 DIAGNOSIS — T82858A Stenosis of vascular prosthetic devices, implants and grafts, initial encounter: Secondary | ICD-10-CM | POA: Diagnosis not present

## 2020-06-20 DIAGNOSIS — N186 End stage renal disease: Secondary | ICD-10-CM | POA: Diagnosis not present

## 2020-06-20 DIAGNOSIS — D631 Anemia in chronic kidney disease: Secondary | ICD-10-CM | POA: Diagnosis not present

## 2020-06-20 DIAGNOSIS — D509 Iron deficiency anemia, unspecified: Secondary | ICD-10-CM | POA: Diagnosis not present

## 2020-06-20 DIAGNOSIS — E039 Hypothyroidism, unspecified: Secondary | ICD-10-CM | POA: Diagnosis not present

## 2020-06-20 DIAGNOSIS — E1129 Type 2 diabetes mellitus with other diabetic kidney complication: Secondary | ICD-10-CM | POA: Diagnosis not present

## 2020-06-20 DIAGNOSIS — Z992 Dependence on renal dialysis: Secondary | ICD-10-CM | POA: Diagnosis not present

## 2020-06-20 DIAGNOSIS — N2581 Secondary hyperparathyroidism of renal origin: Secondary | ICD-10-CM | POA: Diagnosis not present

## 2020-06-22 DIAGNOSIS — N2581 Secondary hyperparathyroidism of renal origin: Secondary | ICD-10-CM | POA: Diagnosis not present

## 2020-06-22 DIAGNOSIS — Z992 Dependence on renal dialysis: Secondary | ICD-10-CM | POA: Diagnosis not present

## 2020-06-22 DIAGNOSIS — D509 Iron deficiency anemia, unspecified: Secondary | ICD-10-CM | POA: Diagnosis not present

## 2020-06-22 DIAGNOSIS — E1129 Type 2 diabetes mellitus with other diabetic kidney complication: Secondary | ICD-10-CM | POA: Diagnosis not present

## 2020-06-22 DIAGNOSIS — D631 Anemia in chronic kidney disease: Secondary | ICD-10-CM | POA: Diagnosis not present

## 2020-06-22 DIAGNOSIS — N186 End stage renal disease: Secondary | ICD-10-CM | POA: Diagnosis not present

## 2020-06-25 DIAGNOSIS — R000 Tachycardia, unspecified: Secondary | ICD-10-CM | POA: Diagnosis not present

## 2020-06-25 DIAGNOSIS — T82838A Hemorrhage of vascular prosthetic devices, implants and grafts, initial encounter: Secondary | ICD-10-CM | POA: Diagnosis not present

## 2020-06-25 DIAGNOSIS — D509 Iron deficiency anemia, unspecified: Secondary | ICD-10-CM | POA: Diagnosis not present

## 2020-06-25 DIAGNOSIS — T82590A Other mechanical complication of surgically created arteriovenous fistula, initial encounter: Secondary | ICD-10-CM | POA: Diagnosis not present

## 2020-06-25 DIAGNOSIS — N186 End stage renal disease: Secondary | ICD-10-CM | POA: Diagnosis not present

## 2020-06-25 DIAGNOSIS — Z87891 Personal history of nicotine dependence: Secondary | ICD-10-CM | POA: Diagnosis not present

## 2020-06-25 DIAGNOSIS — R58 Hemorrhage, not elsewhere classified: Secondary | ICD-10-CM | POA: Diagnosis not present

## 2020-06-25 DIAGNOSIS — N2581 Secondary hyperparathyroidism of renal origin: Secondary | ICD-10-CM | POA: Diagnosis not present

## 2020-06-25 DIAGNOSIS — Z992 Dependence on renal dialysis: Secondary | ICD-10-CM | POA: Diagnosis not present

## 2020-06-25 DIAGNOSIS — E1129 Type 2 diabetes mellitus with other diabetic kidney complication: Secondary | ICD-10-CM | POA: Diagnosis not present

## 2020-06-25 DIAGNOSIS — R0689 Other abnormalities of breathing: Secondary | ICD-10-CM | POA: Diagnosis not present

## 2020-06-25 DIAGNOSIS — D631 Anemia in chronic kidney disease: Secondary | ICD-10-CM | POA: Diagnosis not present

## 2020-06-26 DIAGNOSIS — I4891 Unspecified atrial fibrillation: Secondary | ICD-10-CM | POA: Diagnosis not present

## 2020-06-26 DIAGNOSIS — Z7901 Long term (current) use of anticoagulants: Secondary | ICD-10-CM | POA: Diagnosis not present

## 2020-06-27 DIAGNOSIS — Z992 Dependence on renal dialysis: Secondary | ICD-10-CM | POA: Diagnosis not present

## 2020-06-27 DIAGNOSIS — D631 Anemia in chronic kidney disease: Secondary | ICD-10-CM | POA: Diagnosis not present

## 2020-06-27 DIAGNOSIS — E1129 Type 2 diabetes mellitus with other diabetic kidney complication: Secondary | ICD-10-CM | POA: Diagnosis not present

## 2020-06-27 DIAGNOSIS — D509 Iron deficiency anemia, unspecified: Secondary | ICD-10-CM | POA: Diagnosis not present

## 2020-06-27 DIAGNOSIS — N186 End stage renal disease: Secondary | ICD-10-CM | POA: Diagnosis not present

## 2020-06-27 DIAGNOSIS — N2581 Secondary hyperparathyroidism of renal origin: Secondary | ICD-10-CM | POA: Diagnosis not present

## 2020-06-28 ENCOUNTER — Ambulatory Visit: Payer: Medicare Other | Admitting: Gastroenterology

## 2020-06-29 DIAGNOSIS — N2581 Secondary hyperparathyroidism of renal origin: Secondary | ICD-10-CM | POA: Diagnosis not present

## 2020-06-29 DIAGNOSIS — Z992 Dependence on renal dialysis: Secondary | ICD-10-CM | POA: Diagnosis not present

## 2020-06-29 DIAGNOSIS — D509 Iron deficiency anemia, unspecified: Secondary | ICD-10-CM | POA: Diagnosis not present

## 2020-06-29 DIAGNOSIS — E1129 Type 2 diabetes mellitus with other diabetic kidney complication: Secondary | ICD-10-CM | POA: Diagnosis not present

## 2020-06-29 DIAGNOSIS — N186 End stage renal disease: Secondary | ICD-10-CM | POA: Diagnosis not present

## 2020-06-29 DIAGNOSIS — D631 Anemia in chronic kidney disease: Secondary | ICD-10-CM | POA: Diagnosis not present

## 2020-07-01 ENCOUNTER — Other Ambulatory Visit: Payer: Self-pay

## 2020-07-01 ENCOUNTER — Encounter: Payer: Self-pay | Admitting: Physician Assistant

## 2020-07-01 ENCOUNTER — Ambulatory Visit (INDEPENDENT_AMBULATORY_CARE_PROVIDER_SITE_OTHER): Payer: Medicare Other | Admitting: Physician Assistant

## 2020-07-01 VITALS — BP 113/70 | HR 103 | Temp 98.8°F | Resp 20 | Ht 75.5 in | Wt 262.5 lb

## 2020-07-01 DIAGNOSIS — N186 End stage renal disease: Secondary | ICD-10-CM | POA: Diagnosis not present

## 2020-07-01 NOTE — H&P (View-Only) (Signed)
Established Dialysis Access   History of Present Illness   [name] is a 70 y.o. ([birth year]) male who presents for re-evaluation of pain in old access site in left arm. His left radiocephalic AV fistula was placed by Dr. Kellie [name] on 02/03/13. This began malfunctioning in 2017 and was therefore recommended to have new access placed.  The old access on the left has had recurrent episodes of causing pain. He was last evaluated for pain back in February of 2018 by Dr. Scot [name]. At the time there was no signs of infection or phlebitis so it was recommended he not have any surgical excision.  He returns today explaining that the aneurysmal areas from his old fistula in his left forearm are very uncomfortable and it has worsened recently over past couple months. He says he also notices tingling and pain going into left hand. This is usually worse when he is at dialysis but he has discomfort in it daily. He also says it worries him because at times the aneurysmal areas become very tender and hard and at other times they are soft. He denies any overlying skin changes, increased size of the aneurysmal areas, redness, swelling, fever or chills.   He currently is using a right brachiocephalic AV fistula. He dialyzes on TTS  Current Outpatient Medications  Medication Sig Dispense Refill  . acetaminophen (TYLENOL) 500 MG tablet Take 500-1,000 mg by mouth daily as needed (back pain).    . B Complex-C-Folic Acid (RENA-VITE PO) Take 1 tablet by mouth daily.    . Cholecalciferol (VITAMIN D3) 125 MCG (5000 UT) TABS Take 5,000 Units by mouth daily.    . cinacalcet (SENSIPAR) 30 MG tablet Take 60 mg by mouth See admin instructions. Take one tablet (30 mg) by mouth twice daily - with lunch and supper    . doxercalciferol (HECTOROL) 4 MCG/2ML injection Inject 2.5 mLs (5 mcg total) into the vein Every Tuesday,Thursday,and Saturday with dialysis. 2 mL   . insulin aspart (NOVOLOG) 100 UNIT/ML injection Inject 1-9  Units into the skin 3 (three) times daily before meals. (Patient taking differently: Inject 10 Units into the skin 3 (three) times daily before meals.)    . iron sucrose in sodium chloride 0.9 % 100 mL Iron Sucrose (Venofer)    . lidocaine-prilocaine (EMLA) cream Apply 1 application topically as needed (Apply small amount to access site 1-2 hours before dialysis. Cover with occlusive dressing (saran wrap)).     . Methoxy PEG-Epoetin Beta (MIRCERA IJ) Mircera    . pantoprazole (PROTONIX) 40 MG tablet Take 1 tablet (40 mg total) by mouth 2 (two) times daily. 60 tablet 1  . polysaccharide iron (NIFEREX) 150 MG CAPS capsule Take 1 capsule (150 mg total) by mouth daily. 30 each 2  . sevelamer carbonate (RENVELA) 800 MG tablet Take 800 mg by mouth 3 (three) times daily with meals.    . vitamin C (ASCORBIC ACID) 500 MG tablet Take 500 mg by mouth daily.    Marland Kitchen zinc sulfate 220 (50 Zn) MG capsule Take 220 mg by mouth daily.    Marland Kitchen warfarin (COUMADIN) 1 MG tablet Take 2 tablets (2 mg total) by mouth daily for 14 days. Start coumadin 2mg , increase or decrease dose based on INR and instruction  Given by MD 28 tablet 0   No current facility-administered medications for this visit.    On ROS today: negative unless stated in HPI   Physical Examination   Vitals:   07/01/20 1014  BP: 113/70  Pulse: (!) 103  Resp: 20  Temp: 98.8 F (37.1 C)  TempSrc: Temporal  SpO2: 99%  Weight: 262 lb 8 oz (119.1 kg)  Height: 6' 3.5" (1.918 m)   Body mass index is 32.38 kg/m.  General Well appearing, well nourished, not in any distress  Pulmonary Non labored  Cardiac Regular rate and rhythm  Vascular Vessel Right Left  Radial Palpable Palpable  Brachial Palpable Palpable  Ulnar Not palpable Not palpable    Musculo- skeletal Left forearm radiocephalic av fistula occluded. Two small areas of aneurysm present. No overlying skin changes. No hypopigmentation. No tenderness to palpation. No erythema  M/S 5/5  throughout  , Extremities without ischemic changes    Neurologic A&O; CN grossly intact    Medical Decision Making   [name] is a 70 y.o. 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 Surgical excision  Patient would like to have the aneurysms excised since they continue to give him pain and increased discomfort. Re discussed surgical site pain to be present post operatively and he understands and would like to still proceed  He is on coumadin for atrial fibrillation which will need to be held for 3 days. Will need to notify his cardiologist  I have scheduled him for a revision/ excision of old left radiocephalic AV fistula and aneurysms. Per patient request would like this scheduled sometime next month   Karoline Caldwell, PA-C [facility] Office: [phone]  Clinic MD: Dr. Trula Slade

## 2020-07-01 NOTE — Progress Notes (Signed)
Established Dialysis Access   History of Present Illness   [name] is a 70 y.o. (04-14-1951) male who presents for re-evaluation of pain in old access site in left arm. His left radiocephalic AV fistula was placed by Dr. Kellie Simmering on 02/03/13. This began malfunctioning in 2017 and was therefore recommended to have new access placed.  The old access on the left has had recurrent episodes of causing pain. He was last evaluated for pain back in February of 2018 by Dr. Scot Dock. At the time there was no signs of infection or phlebitis so it was recommended he not have any surgical excision.  He returns today explaining that the aneurysmal areas from his old fistula in his left forearm are very uncomfortable and it has worsened recently over past couple months. He says he also notices tingling and pain going into left hand. This is usually worse when he is at dialysis but he has discomfort in it daily. He also says it worries him because at times the aneurysmal areas become very tender and hard and at other times they are soft. He denies any overlying skin changes, increased size of the aneurysmal areas, redness, swelling, fever or chills.   He currently is using a right brachiocephalic AV fistula. He dialyzes on TTS  Current Outpatient Medications  Medication Sig Dispense Refill  . acetaminophen (TYLENOL) 500 MG tablet Take 500-1,000 mg by mouth daily as needed (back pain).    . B Complex-C-Folic Acid (RENA-VITE PO) Take 1 tablet by mouth daily.    . Cholecalciferol (VITAMIN D3) 125 MCG (5000 UT) TABS Take 5,000 Units by mouth daily.    . cinacalcet (SENSIPAR) 30 MG tablet Take 60 mg by mouth See admin instructions. Take one tablet (30 mg) by mouth twice daily - with lunch and supper    . doxercalciferol (HECTOROL) 4 MCG/2ML injection Inject 2.5 mLs (5 mcg total) into the vein Every Tuesday,Thursday,and Saturday with dialysis. 2 mL   . insulin aspart (NOVOLOG) 100 UNIT/ML injection Inject 1-9  Units into the skin 3 (three) times daily before meals. (Patient taking differently: Inject 10 Units into the skin 3 (three) times daily before meals.)    . iron sucrose in sodium chloride 0.9 % 100 mL Iron Sucrose (Venofer)    . lidocaine-prilocaine (EMLA) cream Apply 1 application topically as needed (Apply small amount to access site 1-2 hours before dialysis. Cover with occlusive dressing (saran wrap)).     . Methoxy PEG-Epoetin Beta (MIRCERA IJ) Mircera    . pantoprazole (PROTONIX) 40 MG tablet Take 1 tablet (40 mg total) by mouth 2 (two) times daily. 60 tablet 1  . polysaccharide iron (NIFEREX) 150 MG CAPS capsule Take 1 capsule (150 mg total) by mouth daily. 30 each 2  . sevelamer carbonate (RENVELA) 800 MG tablet Take 800 mg by mouth 3 (three) times daily with meals.    . vitamin C (ASCORBIC ACID) 500 MG tablet Take 500 mg by mouth daily.    Marland Kitchen zinc sulfate 220 (50 Zn) MG capsule Take 220 mg by mouth daily.    Marland Kitchen warfarin (COUMADIN) 1 MG tablet Take 2 tablets (2 mg total) by mouth daily for 14 days. Start coumadin 2mg , increase or decrease dose based on INR and instruction  Given by MD 28 tablet 0   No current facility-administered medications for this visit.    On ROS today: negative unless stated in HPI   Physical Examination   Vitals:   07/01/20 1014  BP: 113/70  Pulse: (!) 103  Resp: 20  Temp: 98.8 F (37.1 C)  TempSrc: Temporal  SpO2: 99%  Weight: 262 lb 8 oz (119.1 kg)  Height: 6' 3.5" (1.918 m)   Body mass index is 32.38 kg/m.  General Well appearing, well nourished, not in any distress  Pulmonary Non labored  Cardiac Regular rate and rhythm  Vascular Vessel Right Left  Radial Palpable Palpable  Brachial Palpable Palpable  Ulnar Not palpable Not palpable    Musculo- skeletal Left forearm radiocephalic av fistula occluded. Two small areas of aneurysm present. No overlying skin changes. No hypopigmentation. No tenderness to palpation. No erythema  M/S 5/5  throughout  , Extremities without ischemic changes    Neurologic A&O; CN grossly intact    Medical Decision Making   [name] is a 69 y.o. 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 Surgical excision  Patient would like to have the aneurysms excised since they continue to give him pain and increased discomfort. Re discussed surgical site pain to be present post operatively and he understands and would like to still proceed  He is on coumadin for atrial fibrillation which will need to be held for 3 days. Will need to notify his cardiologist  I have scheduled him for a revision/ excision of old left radiocephalic AV fistula and aneurysms. Per patient request would like this scheduled sometime next month   Karoline Caldwell, PA-C [facility] Office: [phone]  Clinic MD: Dr. Trula Slade

## 2020-07-02 DIAGNOSIS — E1129 Type 2 diabetes mellitus with other diabetic kidney complication: Secondary | ICD-10-CM | POA: Diagnosis not present

## 2020-07-02 DIAGNOSIS — Z992 Dependence on renal dialysis: Secondary | ICD-10-CM | POA: Diagnosis not present

## 2020-07-02 DIAGNOSIS — N186 End stage renal disease: Secondary | ICD-10-CM | POA: Diagnosis not present

## 2020-07-02 DIAGNOSIS — D509 Iron deficiency anemia, unspecified: Secondary | ICD-10-CM | POA: Diagnosis not present

## 2020-07-02 DIAGNOSIS — N2581 Secondary hyperparathyroidism of renal origin: Secondary | ICD-10-CM | POA: Diagnosis not present

## 2020-07-02 DIAGNOSIS — D631 Anemia in chronic kidney disease: Secondary | ICD-10-CM | POA: Diagnosis not present

## 2020-07-03 ENCOUNTER — Encounter: Payer: Self-pay | Admitting: Podiatry

## 2020-07-03 ENCOUNTER — Ambulatory Visit: Payer: Medicare Other | Admitting: Podiatry

## 2020-07-03 ENCOUNTER — Other Ambulatory Visit: Payer: Self-pay

## 2020-07-03 ENCOUNTER — Ambulatory Visit (INDEPENDENT_AMBULATORY_CARE_PROVIDER_SITE_OTHER): Payer: Medicare Other | Admitting: Podiatry

## 2020-07-03 DIAGNOSIS — M79676 Pain in unspecified toe(s): Secondary | ICD-10-CM

## 2020-07-03 DIAGNOSIS — B351 Tinea unguium: Secondary | ICD-10-CM

## 2020-07-03 DIAGNOSIS — I739 Peripheral vascular disease, unspecified: Secondary | ICD-10-CM

## 2020-07-03 DIAGNOSIS — E0842 Diabetes mellitus due to underlying condition with diabetic polyneuropathy: Secondary | ICD-10-CM

## 2020-07-03 DIAGNOSIS — Z89431 Acquired absence of right foot: Secondary | ICD-10-CM

## 2020-07-03 DIAGNOSIS — Z7901 Long term (current) use of anticoagulants: Secondary | ICD-10-CM | POA: Diagnosis not present

## 2020-07-03 DIAGNOSIS — I4891 Unspecified atrial fibrillation: Secondary | ICD-10-CM | POA: Diagnosis not present

## 2020-07-03 DIAGNOSIS — D6869 Other thrombophilia: Secondary | ICD-10-CM | POA: Diagnosis not present

## 2020-07-03 NOTE — Progress Notes (Signed)
Complaint:  Visit Type: Patient returns to my office for continued preventative foot care services. Complaint: Patient states" my nails have grown long and thick and become painful to walk and wear shoes on his left foot.  Patient has been diagnosed with DM with angiopathy.  Patient has had transmetatarsal amputation performed toes right foot.. The patient presents for preventative foot care services. No changes to ROS  Podiatric Exam: Vascular: dorsalis pedis and posterior tibial pulses are not  palpable left.  ... Capillary return is immediate. Temperature gradient is WNL. Skin turgor WNL  Significant swelling feet  Left  Sensorium: Normal Semmes Weinstein monofilament test. Normal tactile sensation left foot. Nail Exam: Pt has thick disfigured discolored nails with subungual debris noted  entire nail hallux through fifth toenails left foot. Ulcer Exam: There is no evidence of ulcer or pre-ulcerative changes or infection. Orthopedic Exam: Muscle tone and strength are WNL. No limitations in general ROM. No crepitus or effusions noted. Foot type and digits show no abnormalities. Bony prominences are unremarkable. TMA right foot. Skin: No Porokeratosis. No infection or ulcers  Diagnosis:  Onychomycosis, ,, pain in left toes,  TMA right foot.  Treatment & Plan Procedures and Treatment: Consent by patient was obtained for treatment procedures.   Debridement of mycotic and hypertrophic toenails, 1 through 5 left foot  and clearing of subungual debris. No ulceration, no infection noted.  Return Visit-Office Procedure: Patient instructed to return to the office for a follow up visit 3 months for continued evaluation and treatment.    Gardiner Barefoot DPM

## 2020-07-04 DIAGNOSIS — N2581 Secondary hyperparathyroidism of renal origin: Secondary | ICD-10-CM | POA: Diagnosis not present

## 2020-07-04 DIAGNOSIS — N186 End stage renal disease: Secondary | ICD-10-CM | POA: Diagnosis not present

## 2020-07-04 DIAGNOSIS — D631 Anemia in chronic kidney disease: Secondary | ICD-10-CM | POA: Diagnosis not present

## 2020-07-04 DIAGNOSIS — D509 Iron deficiency anemia, unspecified: Secondary | ICD-10-CM | POA: Diagnosis not present

## 2020-07-04 DIAGNOSIS — E1129 Type 2 diabetes mellitus with other diabetic kidney complication: Secondary | ICD-10-CM | POA: Diagnosis not present

## 2020-07-04 DIAGNOSIS — Z992 Dependence on renal dialysis: Secondary | ICD-10-CM | POA: Diagnosis not present

## 2020-07-06 DIAGNOSIS — E1129 Type 2 diabetes mellitus with other diabetic kidney complication: Secondary | ICD-10-CM | POA: Diagnosis not present

## 2020-07-06 DIAGNOSIS — D631 Anemia in chronic kidney disease: Secondary | ICD-10-CM | POA: Diagnosis not present

## 2020-07-06 DIAGNOSIS — D509 Iron deficiency anemia, unspecified: Secondary | ICD-10-CM | POA: Diagnosis not present

## 2020-07-06 DIAGNOSIS — N186 End stage renal disease: Secondary | ICD-10-CM | POA: Diagnosis not present

## 2020-07-06 DIAGNOSIS — Z992 Dependence on renal dialysis: Secondary | ICD-10-CM | POA: Diagnosis not present

## 2020-07-06 DIAGNOSIS — N2581 Secondary hyperparathyroidism of renal origin: Secondary | ICD-10-CM | POA: Diagnosis not present

## 2020-07-09 DIAGNOSIS — D631 Anemia in chronic kidney disease: Secondary | ICD-10-CM | POA: Diagnosis not present

## 2020-07-09 DIAGNOSIS — D509 Iron deficiency anemia, unspecified: Secondary | ICD-10-CM | POA: Diagnosis not present

## 2020-07-09 DIAGNOSIS — E1129 Type 2 diabetes mellitus with other diabetic kidney complication: Secondary | ICD-10-CM | POA: Diagnosis not present

## 2020-07-09 DIAGNOSIS — N2581 Secondary hyperparathyroidism of renal origin: Secondary | ICD-10-CM | POA: Diagnosis not present

## 2020-07-09 DIAGNOSIS — Z992 Dependence on renal dialysis: Secondary | ICD-10-CM | POA: Diagnosis not present

## 2020-07-09 DIAGNOSIS — N186 End stage renal disease: Secondary | ICD-10-CM | POA: Diagnosis not present

## 2020-07-11 ENCOUNTER — Other Ambulatory Visit: Payer: Self-pay

## 2020-07-11 DIAGNOSIS — N2581 Secondary hyperparathyroidism of renal origin: Secondary | ICD-10-CM | POA: Diagnosis not present

## 2020-07-11 DIAGNOSIS — N186 End stage renal disease: Secondary | ICD-10-CM | POA: Diagnosis not present

## 2020-07-11 DIAGNOSIS — E1129 Type 2 diabetes mellitus with other diabetic kidney complication: Secondary | ICD-10-CM | POA: Diagnosis not present

## 2020-07-11 DIAGNOSIS — Z992 Dependence on renal dialysis: Secondary | ICD-10-CM | POA: Diagnosis not present

## 2020-07-11 DIAGNOSIS — D631 Anemia in chronic kidney disease: Secondary | ICD-10-CM | POA: Diagnosis not present

## 2020-07-11 DIAGNOSIS — D509 Iron deficiency anemia, unspecified: Secondary | ICD-10-CM | POA: Diagnosis not present

## 2020-07-12 ENCOUNTER — Encounter (INDEPENDENT_AMBULATORY_CARE_PROVIDER_SITE_OTHER): Payer: Medicare Other | Admitting: Ophthalmology

## 2020-07-13 DIAGNOSIS — D509 Iron deficiency anemia, unspecified: Secondary | ICD-10-CM | POA: Diagnosis not present

## 2020-07-13 DIAGNOSIS — N186 End stage renal disease: Secondary | ICD-10-CM | POA: Diagnosis not present

## 2020-07-13 DIAGNOSIS — D631 Anemia in chronic kidney disease: Secondary | ICD-10-CM | POA: Diagnosis not present

## 2020-07-13 DIAGNOSIS — E1129 Type 2 diabetes mellitus with other diabetic kidney complication: Secondary | ICD-10-CM | POA: Diagnosis not present

## 2020-07-13 DIAGNOSIS — N2581 Secondary hyperparathyroidism of renal origin: Secondary | ICD-10-CM | POA: Diagnosis not present

## 2020-07-13 DIAGNOSIS — Z992 Dependence on renal dialysis: Secondary | ICD-10-CM | POA: Diagnosis not present

## 2020-07-16 DIAGNOSIS — D631 Anemia in chronic kidney disease: Secondary | ICD-10-CM | POA: Diagnosis not present

## 2020-07-16 DIAGNOSIS — N2581 Secondary hyperparathyroidism of renal origin: Secondary | ICD-10-CM | POA: Diagnosis not present

## 2020-07-16 DIAGNOSIS — D509 Iron deficiency anemia, unspecified: Secondary | ICD-10-CM | POA: Diagnosis not present

## 2020-07-16 DIAGNOSIS — E1129 Type 2 diabetes mellitus with other diabetic kidney complication: Secondary | ICD-10-CM | POA: Diagnosis not present

## 2020-07-16 DIAGNOSIS — N186 End stage renal disease: Secondary | ICD-10-CM | POA: Diagnosis not present

## 2020-07-16 DIAGNOSIS — Z992 Dependence on renal dialysis: Secondary | ICD-10-CM | POA: Diagnosis not present

## 2020-07-17 ENCOUNTER — Ambulatory Visit (INDEPENDENT_AMBULATORY_CARE_PROVIDER_SITE_OTHER): Payer: Medicare Other | Admitting: Gastroenterology

## 2020-07-17 ENCOUNTER — Encounter: Payer: Self-pay | Admitting: Gastroenterology

## 2020-07-17 ENCOUNTER — Other Ambulatory Visit (INDEPENDENT_AMBULATORY_CARE_PROVIDER_SITE_OTHER): Payer: Medicare Other

## 2020-07-17 ENCOUNTER — Other Ambulatory Visit: Payer: Self-pay

## 2020-07-17 VITALS — BP 146/74 | HR 72 | Ht 75.5 in | Wt 258.0 lb

## 2020-07-17 DIAGNOSIS — K8050 Calculus of bile duct without cholangitis or cholecystitis without obstruction: Secondary | ICD-10-CM | POA: Diagnosis not present

## 2020-07-17 DIAGNOSIS — I4891 Unspecified atrial fibrillation: Secondary | ICD-10-CM | POA: Diagnosis not present

## 2020-07-17 DIAGNOSIS — K922 Gastrointestinal hemorrhage, unspecified: Secondary | ICD-10-CM | POA: Diagnosis not present

## 2020-07-17 DIAGNOSIS — Z7901 Long term (current) use of anticoagulants: Secondary | ICD-10-CM | POA: Diagnosis not present

## 2020-07-17 LAB — HEPATIC FUNCTION PANEL
ALT: 18 U/L (ref 0–53)
AST: 24 U/L (ref 0–37)
Albumin: 3.7 g/dL (ref 3.5–5.2)
Alkaline Phosphatase: 130 U/L — ABNORMAL HIGH (ref 39–117)
Bilirubin, Direct: 0.1 mg/dL (ref 0.0–0.3)
Total Bilirubin: 0.5 mg/dL (ref 0.2–1.2)
Total Protein: 7.5 g/dL (ref 6.0–8.3)

## 2020-07-17 LAB — CBC WITH DIFFERENTIAL/PLATELET
Basophils Absolute: 0 10*3/uL (ref 0.0–0.1)
Basophils Relative: 0.6 % (ref 0.0–3.0)
Eosinophils Absolute: 0.1 10*3/uL (ref 0.0–0.7)
Eosinophils Relative: 1.8 % (ref 0.0–5.0)
HCT: 31.1 % — ABNORMAL LOW (ref 39.0–52.0)
Hemoglobin: 10.3 g/dL — ABNORMAL LOW (ref 13.0–17.0)
Lymphocytes Relative: 27.1 % (ref 12.0–46.0)
Lymphs Abs: 1.1 10*3/uL (ref 0.7–4.0)
MCHC: 33.2 g/dL (ref 30.0–36.0)
MCV: 91.1 fl (ref 78.0–100.0)
Monocytes Absolute: 0.3 10*3/uL (ref 0.1–1.0)
Monocytes Relative: 7.6 % (ref 3.0–12.0)
Neutro Abs: 2.5 10*3/uL (ref 1.4–7.7)
Neutrophils Relative %: 62.9 % (ref 43.0–77.0)
Platelets: 156 10*3/uL (ref 150.0–400.0)
RBC: 3.41 Mil/uL — ABNORMAL LOW (ref 4.22–5.81)
RDW: 17.6 % — ABNORMAL HIGH (ref 11.5–15.5)
WBC: 4 10*3/uL (ref 4.0–10.5)

## 2020-07-17 NOTE — Progress Notes (Signed)
07/17/2020 [name] [id number] Dec 04, 1950   HISTORY OF PRESENT ILLNESS: This is a 70 year old male is a patient of Dr. Blanch Media.  He is here for hospital follow-up from hospitalization in December.  He had choledocholithiasis.  05/17/2020 ERCP. Patchy, moderate gastritis, biopsied.  Prominent, bulging, edematous enlarged major papilla with displacement of pancreatic duct versus small fistula at the upper portion of ampulla that was draining bile.  Filling defects on cholangiogram.  Severe dilation of main bile duct.  Stones/sludge removed following biliary sphincterotomy and balloon sweep.  Short lived oozing of blood at sphincterotomy site resolved on its own.   05/18/20 Lap chole.  Noted dilated and chronically inflammed GB, no filling defect at [facility].   Then he had an upper GI bleed about 5 days later that was due to sphincterotomy site in the setting of supratherapeutic INR.  Last CBC that I have is from December 24 with hemoglobin at 9.0 g.  He says that overall he feels well from a GI standpoint.  He tells me that he cannot eat fried/greasy foods, but as long as he avoids those he tends to do well.  He says that his stools are dark, but he is on oral iron supplements once daily.   His last colonoscopy was in April 2017 at which time he was found to have 5 polyps that were removed, measuring up to 9 mm in size.  These were tubular adenomas and it was recommended he have a repeat colonoscopy at a 3-year interval.  He has not yet had that performed.  He tells me that he is not up to having that done at this time.   Past Medical History:  Diagnosis Date  . Anemia   . Arthritis    HNP- lumbar, "all over my body"  . Atrial fibrillation and flutter ([place])   . Blood transfusion    "years ago; blood was low" (08/05/2013)  . Diabetic nephropathy ([place])   . Diabetic retinopathy   . DVT (deep venous thrombosis) ([place])    "got one in my right leg now; I've had one before too, not sure which  leg" (08/05/2013)  . ESRD (end stage renal disease) on dialysis ([place])    TTS, [place]  . Family history of anesthesia complication    " my son wakes up slowly"  . GERD (gastroesophageal reflux disease)    uses alka seltzere on occas.   [name])    "one q now and then" (08/05/2013)  . Hyperlipidemia   . Hypertension   . 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 Non Hodgkin's lymphoma ([place])    Tx 2009; "had chemo; it went away" (08/05/2013)  . Noncompliance 03/16/2012  . NSVT (nonsustained ventricular tachycardia) ([place]) 03/18/2012  . Peripheral vascular disease ([place])   . Poor historian    pt. unsure of several answers to health history questions   . Septic shock ([place])   . Skin cancer    melanoma - head  . Sleep apnea    "suppose to have a sleep study, but they never told me when. (08/05/2013)   Past Surgical History:  Procedure Laterality Date  . ACHILLES TENDON SURGERY Right 03/13/2016   Procedure: ACHILLES LENGTHENING/KIDNER;  Surgeon: Edrick Kins, DPM;  Location: [place];  Service: Podiatry;  Laterality: Right;  . AV FISTULA PLACEMENT Left 02/03/2013   Procedure: ARTERIOVENOUS (AV) FISTULA CREATION- LEFT RADIAL  CEPHALIC; ULTRASOUND GUIDED;  Surgeon: Mal Misty, MD;  [facility];  Service: Vascular;  Laterality: Left;  . AV FISTULA PLACEMENT Right 11/08/2015   Procedure: RIGHT BRACHIOCEPHALIC ARTERIOVENOUS (AV) FISTULA CREATION;  Surgeon: Serafina Mitchell, MD;  Location: [place];  Service: Vascular;  Laterality: Right;  . BASCILIC VEIN TRANSPOSITION Right 01/24/2016   Procedure: RIGHT SECOND STAGE BASILIC VEIN TRANSPOSITION;  Surgeon: Angelia Mould, MD;  Location: [place];  Service: Vascular;  Laterality: Right;  . BIOPSY  05/17/2020   Procedure: BIOPSY;  Surgeon: Rush Landmark Telford Nab., MD;  Location: [place];  Service: Gastroenterology;;  . CARDIAC CATHETERIZATION    . CHOLECYSTECTOMY N/A  05/18/2020   Procedure: LAPAROSCOPIC CHOLECYSTECTOMY WITH INTRAOPERATIVE CHOLANGIOGRAM;  Surgeon: Kieth Brightly Arta Bruce, MD;  Location: [place];  Service: General;  Laterality: N/A;  . COLONOSCOPY N/A 09/23/2015   Procedure: COLONOSCOPY;  Surgeon: Irene Shipper, MD;  Location: [facility] ENDOSCOPY;  Service: Endoscopy;  Laterality: N/A;  . Coloscopy    . ENDOSCOPIC RETROGRADE CHOLANGIOPANCREATOGRAPHY (ERCP) WITH PROPOFOL N/A 05/17/2020   Procedure: ENDOSCOPIC RETROGRADE CHOLANGIOPANCREATOGRAPHY (ERCP) WITH PROPOFOL;  Surgeon: Rush Landmark Telford Nab., MD;  Location: [place];  Service: Gastroenterology;  Laterality: N/A;  . ESOPHAGOGASTRODUODENOSCOPY (EGD) WITH PROPOFOL N/A 05/22/2020   Procedure: ESOPHAGOGASTRODUODENOSCOPY (EGD) WITH PROPOFOL;  Surgeon: Gatha Mayer, MD;  Location: [place];  Service: Endoscopy;  Laterality: N/A;  . EYE SURGERY Bilateral   . GAS INSERTION  05/10/2012   Procedure: INSERTION OF GAS;  Surgeon: Hayden Pedro, MD;  Location: [place];  Service: Ophthalmology;  Laterality: Right;  . LEFT HEART CATH AND CORONARY ANGIOGRAPHY N/A 09/11/2016   Procedure: Left Heart Cath and Coronary Angiography;  Surgeon: Belva Crome, MD;  Location: [place] CV LAB;  Service: Cardiovascular;  Laterality: N/A;  . LESION EXCISION Right 05/08/2015   Procedure: EXCISION SCALP LESION;  Surgeon: Erroll Luna, MD;  Location: [place];  Service: General;  Laterality: Right;  . MEMBRANE PEEL  05/10/2012   Procedure: MEMBRANE PEEL;  Surgeon: Hayden Pedro, MD;  Location: [place];  Service: Ophthalmology;  Laterality: Right;  . PARS PLANA VITRECTOMY  08/27/2011   Procedure: PARS PLANA VITRECTOMY WITH 25 GAUGE;  Surgeon: Hayden Pedro, MD;  Location: [place];  Service: Ophthalmology;  Laterality: Left;  Repair of complex traction retinal detachment left eye  . PARS PLANA VITRECTOMY  05/10/2012   Procedure: PARS PLANA VITRECTOMY WITH 25 GAUGE;  Surgeon: Hayden Pedro, MD;  Location: [place];  Service:  Ophthalmology;  Laterality: Right;  Repair Complex Traction Retinal Detachment  . PHOTOCOAGULATION WITH LASER  05/10/2012   Procedure: PHOTOCOAGULATION WITH LASER;  Surgeon: Hayden Pedro, MD;  Location: [place];  Service: Ophthalmology;  Laterality: Right;  . PORT-A-CATH REMOVAL    . PORTACATH PLACEMENT    . REMOVAL OF STONES  05/17/2020   Procedure: REMOVAL OF STONES;  Surgeon: Rush Landmark Telford Nab., MD;  Location: [facility] ENDOSCOPY;  Service: Gastroenterology;;  . Joan Mayans  05/17/2020   Procedure: Joan Mayans;  Surgeon: Mansouraty, Telford Nab., MD;  Location: [facility] ENDOSCOPY;  Service: Gastroenterology;;  . SUBXYPHOID PERICARDIAL WINDOW N/A 01/10/2018   Procedure: SUBXYPHOID PERICARDIAL WINDOW;  Surgeon: Rexene Alberts, MD;  Location: [place];  Service: Thoracic;  Laterality: N/A;  . TRANSMETATARSAL AMPUTATION Right 03/13/2016   Procedure: TRANSMETATARSAL AMPUTATION;  Surgeon: Edrick Kins, DPM;  Location: [place];  Service: Podiatry;  Laterality: Right;  . VENA CAVA FILTER PLACEMENT  09/2012   due to preparation  for surgery    reports that he has never smoked. He has never used smokeless tobacco. He reports that he does not drink alcohol and does not use drugs. family history includes Anesthesia problems in his son; Diabetes in his father; Hypertension in his father and son; Other in his father. No Known Allergies    Outpatient Encounter Medications as of 07/17/2020  Medication Sig  . acetaminophen (TYLENOL) 500 MG tablet Take 500-1,000 mg by mouth daily as needed (back pain).  . B Complex-C-Folic Acid (RENA-VITE PO) Take 1 tablet by mouth daily.  . Cholecalciferol (VITAMIN D3) 125 MCG (5000 UT) TABS Take 5,000 Units by mouth daily.  . cinacalcet (SENSIPAR) 30 MG tablet Take 60 mg by mouth See admin instructions. Take one tablet (30 mg) by mouth twice daily - with lunch and supper  . doxercalciferol (HECTOROL) 4 MCG/2ML injection Inject 2.5 mLs (5 mcg total) into the vein Every  Tuesday,Thursday,and Saturday with dialysis.  Marland Kitchen insulin aspart (NOVOLOG) 100 UNIT/ML injection Inject 1-9 Units into the skin 3 (three) times daily before meals. (Patient taking differently: Inject 10 Units into the skin 3 (three) times daily before meals.)  . iron sucrose in sodium chloride 0.9 % 100 mL Iron Sucrose (Venofer)  . lidocaine-prilocaine (EMLA) cream Apply 1 application topically as needed (Apply small amount to access site 1-2 hours before dialysis. Cover with occlusive dressing (saran wrap)).   . Methoxy PEG-Epoetin Beta (MIRCERA IJ) Mircera  . pantoprazole (PROTONIX) 40 MG tablet Take 1 tablet (40 mg total) by mouth 2 (two) times daily.  . polysaccharide iron (NIFEREX) 150 MG CAPS capsule Take 1 capsule (150 mg total) by mouth daily.  . sevelamer carbonate (RENVELA) 800 MG tablet Take 800 mg by mouth 3 (three) times daily with meals.  . vitamin C (ASCORBIC ACID) 500 MG tablet Take 500 mg by mouth daily.  Marland Kitchen zinc sulfate 220 (50 Zn) MG capsule Take 220 mg by mouth daily.  Marland Kitchen warfarin (COUMADIN) 1 MG tablet Take 2 tablets (2 mg total) by mouth daily for 14 days. Start coumadin 2mg , increase or decrease dose based on INR and instruction  Given by MD   No facility-administered encounter medications on file as of 07/17/2020.    REVIEW OF SYSTEMS  : All other systems reviewed and negative except where noted in the History of Present Illness.   PHYSICAL EXAM: BP (!) 146/74   Pulse 72   Ht 6' 3.5" (1.918 m)   Wt 258 lb (117 kg)   BMI 31.82 kg/m  General: Well developed AA male in no acute distress Head: Normocephalic and atraumatic Eyes:  Sclerae anicteric, conjunctiva pink. Ears: Normal auditory acuity Lungs: Clear throughout to auscultation; no W/R/R. Heart: Regular rate and rhythm; no M/R/G. Abdomen: Soft, non-distended.  BS present.  Non-tender. Musculoskeletal: Symmetrical with no gross deformities  Skin: No lesions on visible extremities Extremities: No edema   Neurological: Alert oriented x 4, grossly non-focal Psychological:  Alert and cooperative. Normal mood and affect  ASSESSMENT AND PLAN: *CBD stone status post ERCP on 05/17/2020 with sphincterotomy and balloon extraction.  Doing well from that standpoint.  We will repeat LFTs today. *Upper GI bleed after ERCP related to sphincterotomy site in the setting of a supratherapeutic INR.  Stools are dark, but is on oral iron supplements.  We will recheck CBC today. *Chronic anticoagulation with Coumadin *End-stage renal disease on HD *Personal history of colon polyps: Last colonoscopy was April 2017 and it was recommended he have a  repeat in 3 years, so is now 2 years overdue.  He declined proceeding with colonoscopy at this time.   CC:  Burnard Bunting, MD

## 2020-07-17 NOTE — Patient Instructions (Signed)
If you are age 68 or older, your body mass index should be between 23-30. Your Body mass index is 31.82 kg/m. If this is out of the aforementioned range listed, please consider follow up with your Primary Care Provider.  If you are age 75 or younger, your body mass index should be between 19-25. Your Body mass index is 31.82 kg/m. If this is out of the aformentioned range listed, please consider follow up with your Primary Care Provider.   Your provider has requested that you go to the basement level for lab work before leaving today. Press "B" on the elevator. The lab is located at the first door on the left as you exit the elevator.

## 2020-07-17 NOTE — Progress Notes (Signed)
Assessment and plan noted ?

## 2020-07-18 DIAGNOSIS — D631 Anemia in chronic kidney disease: Secondary | ICD-10-CM | POA: Diagnosis not present

## 2020-07-18 DIAGNOSIS — Z992 Dependence on renal dialysis: Secondary | ICD-10-CM | POA: Diagnosis not present

## 2020-07-18 DIAGNOSIS — D509 Iron deficiency anemia, unspecified: Secondary | ICD-10-CM | POA: Diagnosis not present

## 2020-07-18 DIAGNOSIS — N186 End stage renal disease: Secondary | ICD-10-CM | POA: Diagnosis not present

## 2020-07-18 DIAGNOSIS — E1129 Type 2 diabetes mellitus with other diabetic kidney complication: Secondary | ICD-10-CM | POA: Diagnosis not present

## 2020-07-18 DIAGNOSIS — N2581 Secondary hyperparathyroidism of renal origin: Secondary | ICD-10-CM | POA: Diagnosis not present

## 2020-07-20 DIAGNOSIS — D509 Iron deficiency anemia, unspecified: Secondary | ICD-10-CM | POA: Diagnosis not present

## 2020-07-20 DIAGNOSIS — N186 End stage renal disease: Secondary | ICD-10-CM | POA: Diagnosis not present

## 2020-07-20 DIAGNOSIS — Z992 Dependence on renal dialysis: Secondary | ICD-10-CM | POA: Diagnosis not present

## 2020-07-20 DIAGNOSIS — D631 Anemia in chronic kidney disease: Secondary | ICD-10-CM | POA: Diagnosis not present

## 2020-07-20 DIAGNOSIS — E1129 Type 2 diabetes mellitus with other diabetic kidney complication: Secondary | ICD-10-CM | POA: Diagnosis not present

## 2020-07-20 DIAGNOSIS — N2581 Secondary hyperparathyroidism of renal origin: Secondary | ICD-10-CM | POA: Diagnosis not present

## 2020-07-22 ENCOUNTER — Other Ambulatory Visit: Payer: Self-pay

## 2020-07-22 ENCOUNTER — Encounter (INDEPENDENT_AMBULATORY_CARE_PROVIDER_SITE_OTHER): Payer: Medicare Other | Admitting: Ophthalmology

## 2020-07-22 DIAGNOSIS — E113592 Type 2 diabetes mellitus with proliferative diabetic retinopathy without macular edema, left eye: Secondary | ICD-10-CM | POA: Diagnosis not present

## 2020-07-22 DIAGNOSIS — E113511 Type 2 diabetes mellitus with proliferative diabetic retinopathy with macular edema, right eye: Secondary | ICD-10-CM

## 2020-07-22 DIAGNOSIS — H43813 Vitreous degeneration, bilateral: Secondary | ICD-10-CM | POA: Diagnosis not present

## 2020-07-22 DIAGNOSIS — H35033 Hypertensive retinopathy, bilateral: Secondary | ICD-10-CM | POA: Diagnosis not present

## 2020-07-22 DIAGNOSIS — I10 Essential (primary) hypertension: Secondary | ICD-10-CM

## 2020-07-23 DIAGNOSIS — E1129 Type 2 diabetes mellitus with other diabetic kidney complication: Secondary | ICD-10-CM | POA: Diagnosis not present

## 2020-07-23 DIAGNOSIS — D631 Anemia in chronic kidney disease: Secondary | ICD-10-CM | POA: Diagnosis not present

## 2020-07-23 DIAGNOSIS — D509 Iron deficiency anemia, unspecified: Secondary | ICD-10-CM | POA: Diagnosis not present

## 2020-07-23 DIAGNOSIS — N186 End stage renal disease: Secondary | ICD-10-CM | POA: Diagnosis not present

## 2020-07-23 DIAGNOSIS — N2581 Secondary hyperparathyroidism of renal origin: Secondary | ICD-10-CM | POA: Diagnosis not present

## 2020-07-23 DIAGNOSIS — Z992 Dependence on renal dialysis: Secondary | ICD-10-CM | POA: Diagnosis not present

## 2020-07-24 DIAGNOSIS — D6869 Other thrombophilia: Secondary | ICD-10-CM | POA: Diagnosis not present

## 2020-07-24 DIAGNOSIS — I4891 Unspecified atrial fibrillation: Secondary | ICD-10-CM | POA: Diagnosis not present

## 2020-07-24 DIAGNOSIS — Z7901 Long term (current) use of anticoagulants: Secondary | ICD-10-CM | POA: Diagnosis not present

## 2020-07-25 DIAGNOSIS — Z992 Dependence on renal dialysis: Secondary | ICD-10-CM | POA: Diagnosis not present

## 2020-07-25 DIAGNOSIS — N186 End stage renal disease: Secondary | ICD-10-CM | POA: Diagnosis not present

## 2020-07-25 DIAGNOSIS — N2581 Secondary hyperparathyroidism of renal origin: Secondary | ICD-10-CM | POA: Diagnosis not present

## 2020-07-25 DIAGNOSIS — D631 Anemia in chronic kidney disease: Secondary | ICD-10-CM | POA: Diagnosis not present

## 2020-07-25 DIAGNOSIS — D509 Iron deficiency anemia, unspecified: Secondary | ICD-10-CM | POA: Diagnosis not present

## 2020-07-25 DIAGNOSIS — E1129 Type 2 diabetes mellitus with other diabetic kidney complication: Secondary | ICD-10-CM | POA: Diagnosis not present

## 2020-07-27 DIAGNOSIS — N186 End stage renal disease: Secondary | ICD-10-CM | POA: Diagnosis not present

## 2020-07-27 DIAGNOSIS — D631 Anemia in chronic kidney disease: Secondary | ICD-10-CM | POA: Diagnosis not present

## 2020-07-27 DIAGNOSIS — D509 Iron deficiency anemia, unspecified: Secondary | ICD-10-CM | POA: Diagnosis not present

## 2020-07-27 DIAGNOSIS — Z992 Dependence on renal dialysis: Secondary | ICD-10-CM | POA: Diagnosis not present

## 2020-07-27 DIAGNOSIS — N2581 Secondary hyperparathyroidism of renal origin: Secondary | ICD-10-CM | POA: Diagnosis not present

## 2020-07-27 DIAGNOSIS — E1129 Type 2 diabetes mellitus with other diabetic kidney complication: Secondary | ICD-10-CM | POA: Diagnosis not present

## 2020-07-29 ENCOUNTER — Encounter (HOSPITAL_COMMUNITY): Payer: Self-pay | Admitting: Vascular Surgery

## 2020-07-29 ENCOUNTER — Other Ambulatory Visit (HOSPITAL_COMMUNITY)
Admission: RE | Admit: 2020-07-29 | Discharge: 2020-07-29 | Disposition: A | Discharge status: Home / Self Care | Payer: Medicare Other | Source: Ambulatory Visit | Attending: Vascular Surgery | Admitting: Vascular Surgery

## 2020-07-29 DIAGNOSIS — Z01812 Encounter for preprocedural laboratory examination: Secondary | ICD-10-CM | POA: Diagnosis not present

## 2020-07-29 DIAGNOSIS — Z20822 Contact with and (suspected) exposure to covid-19: Secondary | ICD-10-CM | POA: Insufficient documentation

## 2020-07-29 LAB — SARS CORONAVIRUS 2 (TAT 6-24 HRS): SARS Coronavirus 2: NEGATIVE

## 2020-07-29 NOTE — Progress Notes (Signed)
Denies chest pain or shortness of breath. Reports cardiologist is [facility]. Last dose of coumadin 07/27/20. States he has been in quarantine since [place] test and will remain in quarantine. Educated on Environmental manager. Below instructions provided regarding DM.     How to Manage Your Diabetes Before and After Surgery   How do I manage my blood sugar before surgery? . Check your blood sugar at least 4 times a day, starting 2 days before surgery, to make sure that the level is not too high or low. o Check your blood sugar the morning of your surgery when you wake up and every 2 hours until you get to the Short Stay unit. . If your blood sugar is less than 70 mg/dL, you will need to treat for low blood sugar: o Do not take insulin. o Treat a low blood sugar (less than 70 mg/dL) with  cup of clear juice (cranberry or apple), 4 glucose tablets, OR glucose gel. Recheck blood sugar in 15 minutes after treatment (to make sure it is greater than 70 mg/dL). If your blood sugar is not greater than 70 mg/dL on recheck, call [phone] o  for further instructions. . Report your blood sugar to the short stay nurse when you get to Short Stay.  WHAT DO I DO ABOUT MY DIABETES MEDICATION?   Marland Kitchen Do not take oral diabetes medicines (pills) the morning of surgery.  . The day of surgery, do not take other diabetes injectables, including Byetta (exenatide), Bydureon (exenatide ER), Victoza (liraglutide), or Trulicity (dulaglutide).  . If your CBG is greater than 220 mg/dL, you may take  of your sliding scale (correction) dose of insulin.

## 2020-07-30 DIAGNOSIS — T82590A Other mechanical complication of surgically created arteriovenous fistula, initial encounter: Secondary | ICD-10-CM | POA: Diagnosis not present

## 2020-07-30 DIAGNOSIS — I5020 Unspecified systolic (congestive) heart failure: Secondary | ICD-10-CM | POA: Diagnosis not present

## 2020-07-30 DIAGNOSIS — E1129 Type 2 diabetes mellitus with other diabetic kidney complication: Secondary | ICD-10-CM | POA: Diagnosis not present

## 2020-07-30 DIAGNOSIS — Z794 Long term (current) use of insulin: Secondary | ICD-10-CM | POA: Diagnosis not present

## 2020-07-30 DIAGNOSIS — T82898A Other specified complication of vascular prosthetic devices, implants and grafts, initial encounter: Secondary | ICD-10-CM | POA: Diagnosis not present

## 2020-07-30 DIAGNOSIS — Z992 Dependence on renal dialysis: Secondary | ICD-10-CM | POA: Diagnosis not present

## 2020-07-30 DIAGNOSIS — T82858A Stenosis of vascular prosthetic devices, implants and grafts, initial encounter: Secondary | ICD-10-CM | POA: Diagnosis not present

## 2020-07-30 DIAGNOSIS — D631 Anemia in chronic kidney disease: Secondary | ICD-10-CM | POA: Diagnosis not present

## 2020-07-30 DIAGNOSIS — N2581 Secondary hyperparathyroidism of renal origin: Secondary | ICD-10-CM | POA: Diagnosis not present

## 2020-07-30 DIAGNOSIS — D509 Iron deficiency anemia, unspecified: Secondary | ICD-10-CM | POA: Diagnosis not present

## 2020-07-30 DIAGNOSIS — I132 Hypertensive heart and chronic kidney disease with heart failure and with stage 5 chronic kidney disease, or end stage renal disease: Secondary | ICD-10-CM | POA: Diagnosis not present

## 2020-07-30 DIAGNOSIS — E1122 Type 2 diabetes mellitus with diabetic chronic kidney disease: Secondary | ICD-10-CM | POA: Diagnosis not present

## 2020-07-30 DIAGNOSIS — Z79899 Other long term (current) drug therapy: Secondary | ICD-10-CM | POA: Diagnosis not present

## 2020-07-30 DIAGNOSIS — N186 End stage renal disease: Secondary | ICD-10-CM | POA: Diagnosis not present

## 2020-07-30 DIAGNOSIS — Z7901 Long term (current) use of anticoagulants: Secondary | ICD-10-CM | POA: Diagnosis not present

## 2020-07-30 NOTE — Progress Notes (Signed)
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 Reported sleep study recommended in the past.   - Admission 05/13/20-05/24/20 for choledocholithiasis. S/p ERCP/biliary sphincterotomy with stone/slude extraction 05/17/20. S/p cholecystectomy 05/18/20. EGD 05/22/20 for suspected upper GI bleed and noted to have evidence of postsphincterotomy bleeding ulcer, resolved. He had preoperative cardiology evaluation. No additional cardiac testing recommended.    Last Coumadin 07/27/20.  07/29/2020 presurgical COVID-19 test negative.  Anesthesia team to evaluate on the day of procedure.  VS:  BP Readings from Last 3 Encounters:  07/17/20 (!) 146/74  07/01/20 113/70  05/24/20 111/60   Pulse Readings from Last 3 Encounters:  07/17/20 72  07/01/20 (!) 103  05/24/20 76    PROVIDERS: Burnard Bunting, MD is  PCP  Pixie Casino, MD is cardiologist Allegra Lai, MD is EP Scarlette Shorts, MD is GI   LABS: For day of surgery. As of 07/17/20, H/H 10.3/31.1, PLT 156. A1c 7.2% 05/13/20.    EKG: 11/17/19 ([facility]): Sinus rhythm versus 2-1 atrial flutter. First-degree AV block Right bundle branch block Left anterior fascicular block Bifascicular block Left ventricular perjury with repolarization abnormality   CV: Echo 04/07/19: IMPRESSIONS  1. Left ventricular ejection fraction, by visual estimation, is 50 to  55%. The left ventricle has normal function. There is moderately increased  left ventricular hypertrophy.  2. Elevated left atrial and left ventricular end-diastolic pressures.  3. Left ventricular diastolic parameters are consistent with Grade II  diastolic dysfunction (pseudonormalization).  4. Global right ventricle has mildly reduced systolic function.The right  ventricular size is normal. No increase in right ventricular wall  thickness.  5. Left atrial size was moderately dilated.  6. Right atrial size was normal.  7. The mitral valve is normal in structure. Mild mitral valve  regurgitation. No evidence of mitral stenosis.  8. The tricuspid valve is normal in structure. Tricuspid valve  regurgitation is mild.  9. The aortic valve is normal in structure. Aortic valve regurgitation is  not visualized. No evidence of aortic valve sclerosis or stenosis.  10. There is Moderate thickening of the aortic valve.  11. The pulmonic valve was normal in structure. Pulmonic valve  regurgitation is not visualized.  12. Aneurysm of the ascending aorta, measuring 40 mm.  13. Moderately elevated pulmonary artery systolic pressure.  14. The tricuspid regurgitant velocity is 3.06 m/s, and with an assumed  right atrial pressure of 10 mmHg, the estimated right ventricular systolic  pressure is moderately elevated at 47.3 mmHg.  15. The inferior vena cava is normal in size with  greater  than 50%  respiratory variability, suggesting right atrial pressure of 3 mmHg.  16. Since the last study on 01/14/2018 LVEF has increased from 30-35% to  50-55%.    Cardiac cath 09/11/16 (done following 08/14/16 stress test: EF 29%, diffuse hypokinesis, fixed apical inferolateral perfusion defect, could represent prior MI, no ischemia):  Widely patent coronary arteries with luminal irregularities in the LAD and right coronary.  Severe left ventricular systolic dysfunction with global wall motion abnormality and an EF less than 25%.    Past Medical History:  Diagnosis Date  . Anemia   . Arthritis    HNP- lumbar, "all over my body"  . Atrial fibrillation and flutter ([place])   . Blood transfusion    "years ago; blood was low" (08/05/2013)  . Diabetic nephropathy ([place])   . Diabetic retinopathy   . DVT (deep venous thrombosis) ([place])    "got one in my right leg now; I've had one before too, not sure which leg" (08/05/2013)  . ESRD (end stage renal disease) on dialysis ([place])    TTS, [place]  . Family history of anesthesia complication    " my son wakes up slowly"  . GERD (gastroesophageal reflux disease)    uses alka seltzere on occas.   [name])    "one q now and then" (08/05/2013)  . Hyperlipidemia   . Hypertension   . 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 Non Hodgkin's lymphoma ([place])    Tx 2009; "had chemo; it went away" (08/05/2013)  . Noncompliance 03/16/2012  . NSVT (nonsustained ventricular tachycardia) ([place]) 03/18/2012  . Peripheral vascular disease ([place])   . Poor historian    pt. unsure of several answers to health history questions   . Septic shock ([place])   . Skin cancer    melanoma - head  . Sleep apnea    "suppose to have a sleep study, but they never told me when. (08/05/2013)    Past Surgical History:  Procedure Laterality Date  . ACHILLES TENDON SURGERY Right 03/13/2016    Procedure: ACHILLES LENGTHENING/KIDNER;  Surgeon: Edrick Kins, DPM;  Location: [place];  Service: Podiatry;  Laterality: Right;  . AV FISTULA PLACEMENT Left 02/03/2013   Procedure: ARTERIOVENOUS (AV) FISTULA CREATION- LEFT RADIAL CEPHALIC; ULTRASOUND GUIDED;  Surgeon: Mal Misty, MD;  [facility];  Service: Vascular;  Laterality: Left;  . AV FISTULA PLACEMENT Right 11/08/2015   Procedure: RIGHT BRACHIOCEPHALIC ARTERIOVENOUS (AV) FISTULA CREATION;  Surgeon: Serafina Mitchell, MD;  Location: [place];  Service: Vascular;  Laterality: Right;  . BASCILIC VEIN TRANSPOSITION Right 01/24/2016   Procedure: RIGHT SECOND STAGE BASILIC VEIN TRANSPOSITION;  Surgeon: Angelia Mould, MD;  Location: [place];  Service: Vascular;  Laterality: Right;  . BIOPSY  05/17/2020   Procedure: BIOPSY;  Surgeon: Rush Landmark Telford Nab., MD;  Location: [place];  Service: Gastroenterology;;  . CARDIAC CATHETERIZATION    . CHOLECYSTECTOMY N/A 05/18/2020   Procedure: LAPAROSCOPIC CHOLECYSTECTOMY WITH INTRAOPERATIVE CHOLANGIOGRAM;  Surgeon: Kieth Brightly Arta Bruce, MD;  Location: [place];  Service: General;  Laterality: N/A;  . COLONOSCOPY N/A 09/23/2015   Procedure: COLONOSCOPY;  Surgeon: Irene Shipper, MD;  Location: [facility];  Service: Endoscopy;  Laterality: N/A;  . Coloscopy    . ENDOSCOPIC RETROGRADE CHOLANGIOPANCREATOGRAPHY (ERCP) WITH PROPOFOL N/A 05/17/2020   Procedure: ENDOSCOPIC RETROGRADE CHOLANGIOPANCREATOGRAPHY (ERCP) WITH PROPOFOL;  Surgeon: Rush Landmark Telford Nab., MD;  Location: [place];  Service: Gastroenterology;  Laterality: N/A;  . ESOPHAGOGASTRODUODENOSCOPY (EGD) WITH PROPOFOL N/A 05/22/2020   Procedure: ESOPHAGOGASTRODUODENOSCOPY (EGD) WITH PROPOFOL;  Surgeon: Gatha Mayer, MD;  Location: [place];  Service: Endoscopy;  Laterality: N/A;  . EYE SURGERY Bilateral   . GAS INSERTION  05/10/2012   Procedure: INSERTION OF GAS;  Surgeon: Hayden Pedro, MD;  Location: [place];  Service:  Ophthalmology;  Laterality: Right;  . LEFT HEART CATH AND CORONARY ANGIOGRAPHY N/A 09/11/2016   Procedure: Left Heart Cath and Coronary Angiography;  Surgeon: Belva Crome, MD;  Location: [place] [facility];  Service: Cardiovascular;  Laterality: N/A;  . LESION EXCISION Right 05/08/2015   Procedure: EXCISION SCALP LESION;  Surgeon: Erroll Luna, MD;  Location: [place];  Service: General;  Laterality: Right;  . MEMBRANE PEEL  05/10/2012   Procedure: MEMBRANE PEEL;  Surgeon: Hayden Pedro, MD;  Location: [place];  Service: Ophthalmology;  Laterality: Right;  . PARS PLANA VITRECTOMY  08/27/2011   Procedure: PARS PLANA VITRECTOMY WITH 25 GAUGE;  Surgeon: Hayden Pedro, MD;  Location: [place];  Service: Ophthalmology;  Laterality: Left;  Repair of complex traction retinal detachment left eye  . PARS PLANA VITRECTOMY  05/10/2012   Procedure: PARS PLANA VITRECTOMY WITH 25 GAUGE;  Surgeon: Hayden Pedro, MD;  Location: [place];  Service: Ophthalmology;  Laterality: Right;  Repair Complex Traction Retinal Detachment  . PHOTOCOAGULATION WITH LASER  05/10/2012   Procedure: PHOTOCOAGULATION WITH LASER;  Surgeon: Hayden Pedro, MD;  Location: [place];  Service: Ophthalmology;  Laterality: Right;  . PORT-A-CATH REMOVAL    . PORTACATH PLACEMENT    . REMOVAL OF STONES  05/17/2020   Procedure: REMOVAL OF STONES;  Surgeon: Rush Landmark Telford Nab., MD;  Location[facility];  Service: Gastroenterology;;  . Joan Mayans  05/17/2020   Procedure: Joan Mayans;  Surgeon: Mansouraty, Telford Nab., MD;  Location: [facility] ENDOSCOPY;  Service: Gastroenterology;;  . SUBXYPHOID PERICARDIAL WINDOW N/A 01/10/2018   Procedure: SUBXYPHOID PERICARDIAL WINDOW;  Surgeon: Rexene Alberts, MD;  Location: [place];  Service: Thoracic;  Laterality: N/A;  . TRANSMETATARSAL AMPUTATION Right 03/13/2016   Procedure: TRANSMETATARSAL AMPUTATION;  Surgeon: Edrick Kins, DPM;  Location: [place];  Service: Podiatry;  Laterality: Right;  . VENA  CAVA FILTER PLACEMENT  09/2012   due to preparation for surgery    MEDICATIONS: No current facility-administered medications for this encounter.   Marland Kitchen acetaminophen (TYLENOL) 500 MG tablet  . Cholecalciferol (VITAMIN D3) 125 MCG (5000 UT) TABS  . cinacalcet (SENSIPAR) 30 MG tablet  . doxercalciferol (HECTOROL) 4 MCG/2ML injection  . insulin aspart (NOVOLOG) 100 UNIT/ML injection  . iron sucrose in sodium chloride 0.9 % 100 mL  . lidocaine-prilocaine (EMLA) cream  . Methoxy PEG-Epoetin Beta (MIRCERA IJ)  . multivitamin (RENA-VIT) TABS tablet  . pantoprazole (PROTONIX) 40 MG tablet  . polysaccharide iron (NIFEREX) 150 MG CAPS capsule  . sevelamer carbonate (RENVELA) 800 MG tablet  . vitamin C (ASCORBIC ACID) 500 MG tablet  . warfarin (COUMADIN) 1 MG tablet  . zinc sulfate 220 (50 Zn) MG capsule    Myra Gianotti, PA-C Surgical Short Stay/Anesthesiology [facility] Phone [phone] [facility] Phone [phone] 07/30/2020 2:23 PM

## 2020-07-30 NOTE — Anesthesia Preprocedure Evaluation (Addendum)
Anesthesia Evaluation  Patient identified by MRN, date of birth, ID band Patient awake    Reviewed: Allergy & Precautions, NPO status , Patient's Chart, lab work & pertinent test results  History of Anesthesia Complications (+) Family history of anesthesia reaction  Airway Mallampati: II  TM Distance: >3 FB Neck ROM: Full    Dental  (+) Teeth Intact   Pulmonary sleep apnea , Patient abstained from smoking.,    Pulmonary exam normal        Cardiovascular hypertension, Pt. on medications + Peripheral Vascular Disease and + DVT   Rhythm:Regular Rate:Normal     Neuro/Psych  Headaches, negative psych ROS   GI/Hepatic Neg liver ROS, PUD, GERD  Medicated,  Endo/Other  diabetes, Type 2, Insulin Dependent  Renal/GU ESRF and DialysisRenal disease  negative genitourinary   Musculoskeletal  (+) Arthritis ,   Abdominal (+)  Abdomen: soft. Bowel sounds: normal.  Peds  Hematology  (+) anemia , NHL   Anesthesia Other Findings   Reproductive/Obstetrics                            Anesthesia Physical Anesthesia Plan  ASA: III  Anesthesia Plan: MAC and Regional   Post-op Pain Management:    Induction: Intravenous  PONV Risk Score and Plan: 1 and Ondansetron, Midazolam and Treatment may vary due to age or medical condition  Airway Management Planned: Simple Face Mask, Natural Airway and Nasal Cannula  Additional Equipment: None  Intra-op Plan:   Post-operative Plan:   Informed Consent: I have reviewed the patients History and Physical, chart, labs and discussed the procedure including the risks, benefits and alternatives for the proposed anesthesia with the patient or authorized representative who has indicated his/her understanding and acceptance.     Dental advisory given  Plan Discussed with: CRNA  Anesthesia Plan Comments: (PAT note written 07/30/2020 by Myra Gianotti, PA-C. Lab  Results      Component                Value               Date                      WBC                      4.0                 07/17/2020                HGB                      11.6 (L)            07/31/2020                HCT                      34.0 (L)            07/31/2020                MCV                      91.1                07/17/2020  PLT                      156.0               07/17/2020           Lab Results      Component                Value               Date                      NA                       137                 07/31/2020                K                        3.7                 07/31/2020                CO2                      19 (L)              05/21/2020                GLUCOSE                  112 (H)             07/31/2020                BUN                      32 (H)              07/31/2020                CREATININE               7.50 (H)            07/31/2020                CALCIUM                  7.6 (L)             05/21/2020                GFRNONAA                 4 (L)               05/21/2020                GFRAA                    16 (L)              07/16/2018            Echo 04/07/19: IMPRESSIONS  1. Left ventricular ejection fraction, by visual estimation, is 50 to  55%. The left ventricle has normal function. There is moderately increased  left ventricular hypertrophy.  2. Elevated left atrial and  left ventricular end-diastolic pressures.  3. Left ventricular diastolic parameters are consistent with Grade II  diastolic dysfunction (pseudonormalization).  4. Global right ventricle has mildly reduced systolic function.The right  ventricular size is normal. No increase in right ventricular wall  thickness.  5. Left atrial size was moderately dilated.  6. Right atrial size was normal.  7. The mitral valve is normal in structure. Mild mitral valve  regurgitation. No evidence of mitral stenosis.  8. The  tricuspid valve is normal in structure. Tricuspid valve  regurgitation is mild.  9. The aortic valve is normal in structure. Aortic valve regurgitation is  not visualized. No evidence of aortic valve sclerosis or stenosis.  10. There is Moderate thickening of the aortic valve.  11. The pulmonic valve was normal in structure. Pulmonic valve  regurgitation is not visualized.  12. Aneurysm of the ascending aorta, measuring 40 mm.  13. Moderately elevated pulmonary artery systolic pressure.  14. The tricuspid regurgitant velocity is 3.06 m/s, and with an assumed  right atrial pressure of 10 mmHg, the estimated right ventricular systolic  pressure is moderately elevated at 47.3 mmHg.  15. The inferior vena cava is normal in size with greater than 50%  respiratory variability, suggesting right atrial pressure of 3 mmHg.  16. Since the last study on 01/14/2018 LVEF has increased from 30-35% to  50-55%. )       Anesthesia Quick Evaluation

## 2020-07-31 ENCOUNTER — Ambulatory Visit (HOSPITAL_COMMUNITY)
Admission: RE | Admit: 2020-07-31 | Discharge: 2020-07-31 | Disposition: A | Discharge status: Home / Self Care | Payer: Medicare Other | Attending: Vascular Surgery | Admitting: Vascular Surgery

## 2020-07-31 ENCOUNTER — Other Ambulatory Visit: Payer: Self-pay

## 2020-07-31 ENCOUNTER — Ambulatory Visit (HOSPITAL_COMMUNITY): Payer: Medicare Other | Admitting: Vascular Surgery

## 2020-07-31 ENCOUNTER — Encounter (HOSPITAL_COMMUNITY)
Admission: RE | Disposition: A | Discharge status: Home / Self Care | Payer: Self-pay | Source: Home / Self Care | Attending: Vascular Surgery

## 2020-07-31 ENCOUNTER — Encounter (HOSPITAL_COMMUNITY): Payer: Self-pay | Admitting: Vascular Surgery

## 2020-07-31 DIAGNOSIS — I5020 Unspecified systolic (congestive) heart failure: Secondary | ICD-10-CM | POA: Diagnosis not present

## 2020-07-31 DIAGNOSIS — Z79899 Other long term (current) drug therapy: Secondary | ICD-10-CM | POA: Insufficient documentation

## 2020-07-31 DIAGNOSIS — Y832 Surgical operation with anastomosis, bypass or graft as the cause of abnormal reaction of the patient, or of later complication, without mention of misadventure at the time of the procedure: Secondary | ICD-10-CM | POA: Insufficient documentation

## 2020-07-31 DIAGNOSIS — T82858A Stenosis of vascular prosthetic devices, implants and grafts, initial encounter: Secondary | ICD-10-CM | POA: Diagnosis not present

## 2020-07-31 DIAGNOSIS — T82590A Other mechanical complication of surgically created arteriovenous fistula, initial encounter: Secondary | ICD-10-CM | POA: Insufficient documentation

## 2020-07-31 DIAGNOSIS — Z7901 Long term (current) use of anticoagulants: Secondary | ICD-10-CM | POA: Insufficient documentation

## 2020-07-31 DIAGNOSIS — N186 End stage renal disease: Secondary | ICD-10-CM | POA: Diagnosis not present

## 2020-07-31 DIAGNOSIS — Z992 Dependence on renal dialysis: Secondary | ICD-10-CM | POA: Diagnosis not present

## 2020-07-31 DIAGNOSIS — Z794 Long term (current) use of insulin: Secondary | ICD-10-CM | POA: Insufficient documentation

## 2020-07-31 DIAGNOSIS — D631 Anemia in chronic kidney disease: Secondary | ICD-10-CM | POA: Diagnosis not present

## 2020-07-31 DIAGNOSIS — E1122 Type 2 diabetes mellitus with diabetic chronic kidney disease: Secondary | ICD-10-CM | POA: Diagnosis not present

## 2020-07-31 DIAGNOSIS — T82898A Other specified complication of vascular prosthetic devices, implants and grafts, initial encounter: Secondary | ICD-10-CM | POA: Diagnosis not present

## 2020-07-31 DIAGNOSIS — I132 Hypertensive heart and chronic kidney disease with heart failure and with stage 5 chronic kidney disease, or end stage renal disease: Secondary | ICD-10-CM | POA: Diagnosis not present

## 2020-07-31 HISTORY — PX: REVISON OF ARTERIOVENOUS FISTULA: SHX6074

## 2020-07-31 LAB — PROTIME-INR
INR: 1.3 — ABNORMAL HIGH (ref 0.8–1.2)
Prothrombin Time: 15.4 seconds — ABNORMAL HIGH (ref 11.4–15.2)

## 2020-07-31 LAB — POCT I-STAT, CHEM 8
BUN: 32 mg/dL — ABNORMAL HIGH (ref 8–23)
Calcium, Ion: 0.91 mmol/L — ABNORMAL LOW (ref 1.15–1.40)
Chloride: 99 mmol/L (ref 98–111)
Creatinine, Ser: 7.5 mg/dL — ABNORMAL HIGH (ref 0.61–1.24)
Glucose, Bld: 112 mg/dL — ABNORMAL HIGH (ref 70–99)
HCT: 34 % — ABNORMAL LOW (ref 39.0–52.0)
Hemoglobin: 11.6 g/dL — ABNORMAL LOW (ref 13.0–17.0)
Potassium: 3.7 mmol/L (ref 3.5–5.1)
Sodium: 137 mmol/L (ref 135–145)
TCO2: 30 mmol/L (ref 22–32)

## 2020-07-31 LAB — GLUCOSE, CAPILLARY
Glucose-Capillary: 100 mg/dL — ABNORMAL HIGH (ref 70–99)
Glucose-Capillary: 95 mg/dL (ref 70–99)
Glucose-Capillary: 94 mg/dL (ref 70–99)

## 2020-07-31 SURGERY — REVISON OF ARTERIOVENOUS FISTULA
Anesthesia: Monitor Anesthesia Care | Laterality: Left

## 2020-07-31 MED ORDER — SODIUM CHLORIDE 0.9 % IR SOLN
Status: DC | PRN
  Administered 2020-07-31: 1

## 2020-07-31 MED ORDER — CHLORHEXIDINE GLUCONATE 4 % EX LIQD: 60.0000 mL | Freq: Once | CUTANEOUS | Status: DC

## 2020-07-31 MED ORDER — MIDAZOLAM HCL 2 MG/2ML IJ SOLN: 2.0000 mg | Freq: Once | INTRAMUSCULAR | Status: AC

## 2020-07-31 MED ORDER — SODIUM CHLORIDE 0.9 % IV SOLN
INTRAVENOUS | Status: AC
  Filled 2020-07-31: qty 1.2

## 2020-07-31 MED ORDER — ACETAMINOPHEN 10 MG/ML IV SOLN: 1000.0000 mg | Freq: Once | INTRAVENOUS | Status: DC | PRN

## 2020-07-31 MED ORDER — FENTANYL CITRATE (PF) 100 MCG/2ML IJ SOLN: 50.0000 ug | Freq: Once | INTRAMUSCULAR | Status: AC

## 2020-07-31 MED ORDER — SODIUM CHLORIDE 0.9 % IV SOLN: INTRAVENOUS | Status: DC

## 2020-07-31 MED ORDER — CHLORHEXIDINE GLUCONATE 0.12 % MT SOLN
OROMUCOSAL | Status: AC
  Administered 2020-07-31: 15 mL
  Filled 2020-07-31: qty 15

## 2020-07-31 MED ORDER — FENTANYL CITRATE (PF) 250 MCG/5ML IJ SOLN
INTRAMUSCULAR | Status: AC
  Filled 2020-07-31: qty 5

## 2020-07-31 MED ORDER — PROMETHAZINE HCL 25 MG/ML IJ SOLN: 6.2500 mg | INTRAMUSCULAR | Status: DC | PRN

## 2020-07-31 MED ORDER — OXYCODONE-ACETAMINOPHEN 5-325 MG PO TABS: 1.0000 | ORAL_TABLET | Freq: Four times a day (QID) | ORAL | 0 refills | Status: DC | PRN

## 2020-07-31 MED ORDER — MIDAZOLAM HCL 2 MG/2ML IJ SOLN
INTRAMUSCULAR | Status: AC
  Administered 2020-07-31: 2 mg via INTRAVENOUS
  Filled 2020-07-31: qty 2

## 2020-07-31 MED ORDER — FENTANYL CITRATE (PF) 100 MCG/2ML IJ SOLN: 25.0000 ug | INTRAMUSCULAR | Status: DC | PRN

## 2020-07-31 MED ORDER — LIDOCAINE 2% (20 MG/ML) 5 ML SYRINGE
INTRAMUSCULAR | Status: DC | PRN
  Administered 2020-07-31: 40 mg via INTRAVENOUS

## 2020-07-31 MED ORDER — LIDOCAINE-EPINEPHRINE 0.5 %-1:200000 IJ SOLN
INTRAMUSCULAR | Status: AC
  Filled 2020-07-31: qty 1

## 2020-07-31 MED ORDER — ROPIVACAINE HCL 5 MG/ML IJ SOLN
INTRAMUSCULAR | Status: DC | PRN
  Administered 2020-07-31: 10 mL via PERINEURAL

## 2020-07-31 MED ORDER — FENTANYL CITRATE (PF) 100 MCG/2ML IJ SOLN
INTRAMUSCULAR | Status: AC
  Administered 2020-07-31: 50 ug via INTRAVENOUS
  Filled 2020-07-31: qty 2

## 2020-07-31 MED ORDER — PROPOFOL 500 MG/50ML IV EMUL
INTRAVENOUS | Status: DC | PRN
  Administered 2020-07-31: 50 ug/kg/min via INTRAVENOUS

## 2020-07-31 MED ORDER — LIDOCAINE HCL (PF) 2 % IJ SOLN
INTRAMUSCULAR | Status: DC | PRN
  Administered 2020-07-31: 300 mg via PERINEURAL

## 2020-07-31 MED ORDER — SODIUM CHLORIDE 0.9 % IV SOLN: INTRAVENOUS | Status: DC | PRN

## 2020-07-31 MED ORDER — PHENYLEPHRINE HCL-NACL 10-0.9 MG/250ML-% IV SOLN
INTRAVENOUS | Status: DC | PRN
  Administered 2020-07-31: 25 ug/min via INTRAVENOUS

## 2020-07-31 MED ORDER — CEFAZOLIN SODIUM-DEXTROSE 2-4 GM/100ML-% IV SOLN
2.0000 g | INTRAVENOUS | Status: AC
  Administered 2020-07-31: 2 g via INTRAVENOUS
  Filled 2020-07-31: qty 100

## 2020-07-31 SURGICAL SUPPLY — 30 items
ADH SKN CLS APL DERMABOND .7 (GAUZE/BANDAGES/DRESSINGS) ×1
ARMBAND PINK RESTRICT EXTREMIT (MISCELLANEOUS) ×2 IMPLANT
BNDG ELASTIC 4X5.8 VLCR STR LF (GAUZE/BANDAGES/DRESSINGS) ×1 IMPLANT
BNDG GAUZE ELAST 4 BULKY (GAUZE/BANDAGES/DRESSINGS) ×1 IMPLANT
CANISTER SUCT 3000ML PPV (MISCELLANEOUS) ×2 IMPLANT
CANNULA VESSEL 3MM 2 BLNT TIP (CANNULA) ×2 IMPLANT
CLIP LIGATING EXTRA MED SLVR (CLIP) ×2 IMPLANT
CLIP LIGATING EXTRA SM BLUE (MISCELLANEOUS) ×2 IMPLANT
COVER PROBE W GEL 5X96 (DRAPES) IMPLANT
COVER WAND RF STERILE (DRAPES) ×2 IMPLANT
DECANTER SPIKE VIAL GLASS SM (MISCELLANEOUS) ×2 IMPLANT
DERMABOND ADVANCED (GAUZE/BANDAGES/DRESSINGS) ×1
DERMABOND ADVANCED .7 DNX12 (GAUZE/BANDAGES/DRESSINGS) ×1 IMPLANT
ELECT REM PT RETURN 9FT ADLT (ELECTROSURGICAL) ×2
ELECTRODE REM PT RTRN 9FT ADLT (ELECTROSURGICAL) ×1 IMPLANT
GLOVE SS BIOGEL STRL SZ 7.5 (GLOVE) ×1 IMPLANT
GLOVE SUPERSENSE BIOGEL SZ 7.5 (GLOVE) ×1
GOWN STRL REUS W/ TWL LRG LVL3 (GOWN DISPOSABLE) ×3 IMPLANT
GOWN STRL REUS W/TWL LRG LVL3 (GOWN DISPOSABLE) ×6
KIT BASIN OR (CUSTOM PROCEDURE TRAY) ×2 IMPLANT
KIT TURNOVER KIT B (KITS) ×2 IMPLANT
NS IRRIG 1000ML POUR BTL (IV SOLUTION) ×2 IMPLANT
PACK CV ACCESS (CUSTOM PROCEDURE TRAY) ×2 IMPLANT
PAD ARMBOARD 7.5X6 YLW CONV (MISCELLANEOUS) ×4 IMPLANT
SUT PROLENE 6 0 CC (SUTURE) ×2 IMPLANT
SUT VIC AB 3-0 SH 27 (SUTURE) ×2
SUT VIC AB 3-0 SH 27X BRD (SUTURE) ×1 IMPLANT
TOWEL GREEN STERILE (TOWEL DISPOSABLE) ×2 IMPLANT
UNDERPAD 30X36 HEAVY ABSORB (UNDERPADS AND DIAPERS) ×2 IMPLANT
WATER STERILE IRR 1000ML POUR (IV SOLUTION) ×2 IMPLANT

## 2020-07-31 NOTE — Anesthesia Procedure Notes (Signed)
Anesthesia Regional Block: Supraclavicular block   Pre-Anesthetic Checklist: ,, timeout performed, Correct Patient, Correct Site, Correct Laterality, Correct Procedure, Correct Position, site marked, Risks and benefits discussed,  Surgical consent,  Pre-op evaluation,  At surgeon's request and post-op pain management  Laterality: Left  Prep: Dura Prep       Needles:  Injection technique: Single-shot  Needle Type: Echogenic Stimulator Needle     Needle Length: 4cm  Needle Gauge: 20     Additional Needles:   Procedures:,,,, ultrasound used (permanent image in chart),,,,  Narrative:  Start time: 07/31/2020 12:00 PM End time: 07/31/2020 12:05 PM Injection made incrementally with aspirations every 5 mL.  Performed by: Personally  Anesthesiologist: Darral Dash, DO  Additional Notes: Patient identified. Risks/Benefits/Options discussed with patient including but not limited to bleeding, infection, nerve damage, failed block, incomplete pain control. Patient expressed understanding and wished to proceed. All questions were answered. Sterile technique was used throughout the entire procedure. Please see nursing notes for vital signs. Aspirated in 5cc intervals with injection for negative confirmation. Patient was given instructions on fall risk and not to get out of bed. All questions and concerns addressed with instructions to call with any issues or inadequate analgesia.

## 2020-07-31 NOTE — Op Note (Signed)
    OPERATIVE REPORT  DATE OF SURGERY: 07/31/2020  PATIENT: [name], 70 y.o. male MRN: [id number]  DOB: 04-22-1951  PRE-OPERATIVE DIAGNOSIS: Painful aneurysms old nonfunctional left arm AV fistula  POST-OPERATIVE DIAGNOSIS:  Same  PROCEDURE: Excision of 2 old nonfunctional aneurysms left arm AV fistula  SURGEON:  Curt Jews, [name]  PHYSICIAN ASSISTANT: Ellsworth Lennox, RNFA  The assistant was needed for exposure and to expedite the case  ANESTHESIA: Block  EBL: per anesthesia record  Total I/O In: 350 [I.V.:250; IV Piggyback:100] Out: -   BLOOD ADMINISTERED: none  DRAINS: none  SPECIMEN: none  COUNTS CORRECT:  YES  PATIENT DISPOSITION:  PACU - hemodynamically stable  PROCEDURE DETAILS: Patient was taken operating placed to position the area of the left arm prepped draped in sterile fashion.  Patient had to areas of aneurysmal change in his old nonfunctional left arm AV fistula in the forearm.  Scissors made over each of these longitudinally and is carried down through the subcutaneous tissue to encircle the venous aneurysms.  The vein was ligated proximally and distally with 2-0 silk ties and the aneurysms were resected.  Both wounds were irrigated with saline and hemostasis was obtained with electrocautery.  Wounds were closed with 3-0 Vicryl in the subcutaneous and subcuticular tissue.  Curlex and Ace wrap were placed over this.  The patient was transferred to the recovery in stable condition   Rosetta Posner, [name], [facility] 07/31/2020 2:10 PM  Note: Portions of this report may have been transcribed using voice recognition software.  Every effort has been made to ensure accuracy; however, inadvertent computerized transcription errors may still be present.

## 2020-07-31 NOTE — Interval H&P Note (Signed)
History and Physical Interval Note:  07/31/2020 11:32 AM  [name]  has presented today for surgery, with the diagnosis of ESRD.  The various methods of treatment have been discussed with the patient and family. 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  The patient's history has been reviewed, patient examined, no change in status, stable for surgery.  I have reviewed the patient's chart and labs.  Questions were answered to the patient's satisfaction.     Curt Jews

## 2020-07-31 NOTE — Anesthesia Postprocedure Evaluation (Signed)
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  Last Vitals:  Vitals:   07/31/20 1450 07/31/20 1456  BP: (!) 147/119 (!) 145/53  Pulse: 72 74  Resp: 14 14  Temp:  (!) 36.4 C  SpO2: 100% 100%    Last Pain:  Vitals:   07/31/20 1456  TempSrc:   PainSc: 0-No pain                 JOSLIN,DAVID COKER

## 2020-07-31 NOTE — Anesthesia Procedure Notes (Signed)
Procedure Name: MAC Date/Time: 07/31/2020 12:52 PM Performed by: Colin Benton, CRNA Pre-anesthesia Checklist: Patient identified, Emergency Drugs available, Suction available and Patient being monitored Patient Re-evaluated:Patient Re-evaluated prior to induction Oxygen Delivery Method: Simple face mask Induction Type: IV induction Placement Confirmation: positive ETCO2 Dental Injury: Teeth and Oropharynx as per pre-operative assessment

## 2020-07-31 NOTE — Progress Notes (Signed)
Orthopedic Tech Progress Note Patient Details:  [name] [name] 17-Apr-1951 [id number] PACU RN called requesting an ARM SLING Ortho Devices Type of Ortho Device: Arm sling Ortho Device/Splint Location: LUE Ortho Device/Splint Interventions: Ordered,Application,Adjustment   Post Interventions Patient Tolerated: Well Instructions Provided: Care of [place] 07/31/2020, 2:51 PM

## 2020-07-31 NOTE — Discharge Instructions (Signed)
   [facility]  Discharge Instructions  AV Fistula or Graft Surgery for Dialysis Access  Please refer to the following instructions for your post-procedure care. Your surgeon or physician assistant will discuss any changes with you.  Activity  You may drive the day following your surgery, if you are comfortable and no longer taking prescription pain medication. Resume full activity as the soreness in your incision resolves.  Bathing/Showering  You may shower after you go home. Keep your incision dry for 48 hours. Do not soak in a bathtub, hot tub, or swim until the incision heals completely. You may not shower if you have a hemodialysis catheter.  Incision Care  Clean your incision with mild soap and water after 48 hours. Pat the area dry with a clean towel. You do not need a bandage unless otherwise instructed. Do not apply any ointments or creams to your incision. You may have skin glue on your incision. Do not peel it off. It will come off on its own in about one week. Your arm may swell a bit after surgery. To reduce swelling use pillows to elevate your arm so it is above your heart. Your doctor will tell you if you need to lightly wrap your arm with an ACE bandage.  Diet  Resume your normal diet. There are not special food restrictions following this procedure. In order to heal from your surgery, it is CRITICAL to get adequate nutrition. Your body requires vitamins, minerals, and protein. Vegetables are the best source of vitamins and minerals. Vegetables also provide the perfect balance of protein. Processed food has little nutritional value, so try to avoid this.  Medications  Resume taking all of your medications. If your incision is causing pain, you may take over-the counter pain relievers such as acetaminophen (Tylenol). If you were prescribed a stronger pain medication, please be aware these medications can cause nausea and constipation. Prevent  nausea by taking the medication with a snack or meal. Avoid constipation by drinking plenty of fluids and eating foods with high amount of fiber, such as fruits, vegetables, and grains.  Do not take Tylenol if you are taking prescription pain medications.  Follow up Your surgeon may want to see you in the office following your access surgery. If so, this will be arranged at the time of your surgery.  Please call us immediately for any of the following conditions:  . Increased pain, redness, drainage (pus) from your incision site . Fever of 101 degrees or higher . Severe or worsening pain at your incision site . Hand pain or numbness. .  Reduce your risk of vascular disease:  . Stop smoking. If you would like help, call [facility] at 1-800-QUIT-NOW (715)440-8248) or [place] at 570 331 0408  . Manage your cholesterol . Maintain a desired weight . Control your diabetes . Keep your blood pressure down  Dialysis  It will take several weeks to several months for your new dialysis access to be ready for use. Your surgeon will determine when it is okay to use it. Your nephrologist will continue to direct your dialysis. You can continue to use your Permcath until your new access is ready for use.   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

## 2020-07-31 NOTE — Transfer of Care (Signed)
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  Last Pain:  Vitals:   07/31/20 0953  TempSrc:   PainSc: 0-No pain         Complications: No complications documented.

## 2020-08-01 ENCOUNTER — Encounter (HOSPITAL_COMMUNITY): Payer: Self-pay | Admitting: Vascular Surgery

## 2020-08-01 DIAGNOSIS — Z992 Dependence on renal dialysis: Secondary | ICD-10-CM | POA: Diagnosis not present

## 2020-08-01 DIAGNOSIS — D509 Iron deficiency anemia, unspecified: Secondary | ICD-10-CM | POA: Diagnosis not present

## 2020-08-01 DIAGNOSIS — D631 Anemia in chronic kidney disease: Secondary | ICD-10-CM | POA: Diagnosis not present

## 2020-08-01 DIAGNOSIS — N186 End stage renal disease: Secondary | ICD-10-CM | POA: Diagnosis not present

## 2020-08-01 DIAGNOSIS — E1129 Type 2 diabetes mellitus with other diabetic kidney complication: Secondary | ICD-10-CM | POA: Diagnosis not present

## 2020-08-01 DIAGNOSIS — N2581 Secondary hyperparathyroidism of renal origin: Secondary | ICD-10-CM | POA: Diagnosis not present

## 2020-08-03 DIAGNOSIS — N2581 Secondary hyperparathyroidism of renal origin: Secondary | ICD-10-CM | POA: Diagnosis not present

## 2020-08-03 DIAGNOSIS — D631 Anemia in chronic kidney disease: Secondary | ICD-10-CM | POA: Diagnosis not present

## 2020-08-03 DIAGNOSIS — Z992 Dependence on renal dialysis: Secondary | ICD-10-CM | POA: Diagnosis not present

## 2020-08-03 DIAGNOSIS — N186 End stage renal disease: Secondary | ICD-10-CM | POA: Diagnosis not present

## 2020-08-03 DIAGNOSIS — D509 Iron deficiency anemia, unspecified: Secondary | ICD-10-CM | POA: Diagnosis not present

## 2020-08-03 DIAGNOSIS — E1129 Type 2 diabetes mellitus with other diabetic kidney complication: Secondary | ICD-10-CM | POA: Diagnosis not present

## 2020-08-06 DIAGNOSIS — D631 Anemia in chronic kidney disease: Secondary | ICD-10-CM | POA: Diagnosis not present

## 2020-08-06 DIAGNOSIS — D509 Iron deficiency anemia, unspecified: Secondary | ICD-10-CM | POA: Diagnosis not present

## 2020-08-06 DIAGNOSIS — N2581 Secondary hyperparathyroidism of renal origin: Secondary | ICD-10-CM | POA: Diagnosis not present

## 2020-08-06 DIAGNOSIS — N186 End stage renal disease: Secondary | ICD-10-CM | POA: Diagnosis not present

## 2020-08-06 DIAGNOSIS — E1129 Type 2 diabetes mellitus with other diabetic kidney complication: Secondary | ICD-10-CM | POA: Diagnosis not present

## 2020-08-06 DIAGNOSIS — Z992 Dependence on renal dialysis: Secondary | ICD-10-CM | POA: Diagnosis not present

## 2020-08-07 DIAGNOSIS — D6869 Other thrombophilia: Secondary | ICD-10-CM | POA: Diagnosis not present

## 2020-08-07 DIAGNOSIS — Z7901 Long term (current) use of anticoagulants: Secondary | ICD-10-CM | POA: Diagnosis not present

## 2020-08-07 DIAGNOSIS — I4891 Unspecified atrial fibrillation: Secondary | ICD-10-CM | POA: Diagnosis not present

## 2020-08-08 DIAGNOSIS — N186 End stage renal disease: Secondary | ICD-10-CM | POA: Diagnosis not present

## 2020-08-08 DIAGNOSIS — D631 Anemia in chronic kidney disease: Secondary | ICD-10-CM | POA: Diagnosis not present

## 2020-08-08 DIAGNOSIS — Z992 Dependence on renal dialysis: Secondary | ICD-10-CM | POA: Diagnosis not present

## 2020-08-08 DIAGNOSIS — N2581 Secondary hyperparathyroidism of renal origin: Secondary | ICD-10-CM | POA: Diagnosis not present

## 2020-08-08 DIAGNOSIS — E1129 Type 2 diabetes mellitus with other diabetic kidney complication: Secondary | ICD-10-CM | POA: Diagnosis not present

## 2020-08-08 DIAGNOSIS — D509 Iron deficiency anemia, unspecified: Secondary | ICD-10-CM | POA: Diagnosis not present

## 2020-08-10 DIAGNOSIS — E1129 Type 2 diabetes mellitus with other diabetic kidney complication: Secondary | ICD-10-CM | POA: Diagnosis not present

## 2020-08-10 DIAGNOSIS — Z992 Dependence on renal dialysis: Secondary | ICD-10-CM | POA: Diagnosis not present

## 2020-08-10 DIAGNOSIS — D631 Anemia in chronic kidney disease: Secondary | ICD-10-CM | POA: Diagnosis not present

## 2020-08-10 DIAGNOSIS — N2581 Secondary hyperparathyroidism of renal origin: Secondary | ICD-10-CM | POA: Diagnosis not present

## 2020-08-10 DIAGNOSIS — D509 Iron deficiency anemia, unspecified: Secondary | ICD-10-CM | POA: Diagnosis not present

## 2020-08-10 DIAGNOSIS — N186 End stage renal disease: Secondary | ICD-10-CM | POA: Diagnosis not present

## 2020-08-13 DIAGNOSIS — D631 Anemia in chronic kidney disease: Secondary | ICD-10-CM | POA: Diagnosis not present

## 2020-08-13 DIAGNOSIS — Z992 Dependence on renal dialysis: Secondary | ICD-10-CM | POA: Diagnosis not present

## 2020-08-13 DIAGNOSIS — N186 End stage renal disease: Secondary | ICD-10-CM | POA: Diagnosis not present

## 2020-08-13 DIAGNOSIS — D509 Iron deficiency anemia, unspecified: Secondary | ICD-10-CM | POA: Diagnosis not present

## 2020-08-13 DIAGNOSIS — E1129 Type 2 diabetes mellitus with other diabetic kidney complication: Secondary | ICD-10-CM | POA: Diagnosis not present

## 2020-08-13 DIAGNOSIS — N2581 Secondary hyperparathyroidism of renal origin: Secondary | ICD-10-CM | POA: Diagnosis not present

## 2020-08-15 DIAGNOSIS — D631 Anemia in chronic kidney disease: Secondary | ICD-10-CM | POA: Diagnosis not present

## 2020-08-15 DIAGNOSIS — N186 End stage renal disease: Secondary | ICD-10-CM | POA: Diagnosis not present

## 2020-08-15 DIAGNOSIS — Z992 Dependence on renal dialysis: Secondary | ICD-10-CM | POA: Diagnosis not present

## 2020-08-15 DIAGNOSIS — N2581 Secondary hyperparathyroidism of renal origin: Secondary | ICD-10-CM | POA: Diagnosis not present

## 2020-08-15 DIAGNOSIS — D509 Iron deficiency anemia, unspecified: Secondary | ICD-10-CM | POA: Diagnosis not present

## 2020-08-15 DIAGNOSIS — E1129 Type 2 diabetes mellitus with other diabetic kidney complication: Secondary | ICD-10-CM | POA: Diagnosis not present

## 2020-08-17 DIAGNOSIS — D509 Iron deficiency anemia, unspecified: Secondary | ICD-10-CM | POA: Diagnosis not present

## 2020-08-17 DIAGNOSIS — D631 Anemia in chronic kidney disease: Secondary | ICD-10-CM | POA: Diagnosis not present

## 2020-08-17 DIAGNOSIS — Z992 Dependence on renal dialysis: Secondary | ICD-10-CM | POA: Diagnosis not present

## 2020-08-17 DIAGNOSIS — E1129 Type 2 diabetes mellitus with other diabetic kidney complication: Secondary | ICD-10-CM | POA: Diagnosis not present

## 2020-08-17 DIAGNOSIS — N2581 Secondary hyperparathyroidism of renal origin: Secondary | ICD-10-CM | POA: Diagnosis not present

## 2020-08-17 DIAGNOSIS — N186 End stage renal disease: Secondary | ICD-10-CM | POA: Diagnosis not present

## 2020-08-20 DIAGNOSIS — E1129 Type 2 diabetes mellitus with other diabetic kidney complication: Secondary | ICD-10-CM | POA: Diagnosis not present

## 2020-08-20 DIAGNOSIS — N186 End stage renal disease: Secondary | ICD-10-CM | POA: Diagnosis not present

## 2020-08-20 DIAGNOSIS — N2581 Secondary hyperparathyroidism of renal origin: Secondary | ICD-10-CM | POA: Diagnosis not present

## 2020-08-20 DIAGNOSIS — D509 Iron deficiency anemia, unspecified: Secondary | ICD-10-CM | POA: Diagnosis not present

## 2020-08-20 DIAGNOSIS — Z992 Dependence on renal dialysis: Secondary | ICD-10-CM | POA: Diagnosis not present

## 2020-08-20 DIAGNOSIS — D631 Anemia in chronic kidney disease: Secondary | ICD-10-CM | POA: Diagnosis not present

## 2020-08-21 DIAGNOSIS — D6869 Other thrombophilia: Secondary | ICD-10-CM | POA: Diagnosis not present

## 2020-08-21 DIAGNOSIS — Z7901 Long term (current) use of anticoagulants: Secondary | ICD-10-CM | POA: Diagnosis not present

## 2020-08-21 DIAGNOSIS — I80202 Phlebitis and thrombophlebitis of unspecified deep vessels of left lower extremity: Secondary | ICD-10-CM | POA: Diagnosis not present

## 2020-08-21 DIAGNOSIS — I4891 Unspecified atrial fibrillation: Secondary | ICD-10-CM | POA: Diagnosis not present

## 2020-08-22 DIAGNOSIS — D631 Anemia in chronic kidney disease: Secondary | ICD-10-CM | POA: Diagnosis not present

## 2020-08-22 DIAGNOSIS — D509 Iron deficiency anemia, unspecified: Secondary | ICD-10-CM | POA: Diagnosis not present

## 2020-08-22 DIAGNOSIS — Z992 Dependence on renal dialysis: Secondary | ICD-10-CM | POA: Diagnosis not present

## 2020-08-22 DIAGNOSIS — N2581 Secondary hyperparathyroidism of renal origin: Secondary | ICD-10-CM | POA: Diagnosis not present

## 2020-08-22 DIAGNOSIS — E1129 Type 2 diabetes mellitus with other diabetic kidney complication: Secondary | ICD-10-CM | POA: Diagnosis not present

## 2020-08-22 DIAGNOSIS — N186 End stage renal disease: Secondary | ICD-10-CM | POA: Diagnosis not present

## 2020-08-24 DIAGNOSIS — N2581 Secondary hyperparathyroidism of renal origin: Secondary | ICD-10-CM | POA: Diagnosis not present

## 2020-08-24 DIAGNOSIS — N186 End stage renal disease: Secondary | ICD-10-CM | POA: Diagnosis not present

## 2020-08-24 DIAGNOSIS — D509 Iron deficiency anemia, unspecified: Secondary | ICD-10-CM | POA: Diagnosis not present

## 2020-08-24 DIAGNOSIS — E1129 Type 2 diabetes mellitus with other diabetic kidney complication: Secondary | ICD-10-CM | POA: Diagnosis not present

## 2020-08-24 DIAGNOSIS — D631 Anemia in chronic kidney disease: Secondary | ICD-10-CM | POA: Diagnosis not present

## 2020-08-24 DIAGNOSIS — Z992 Dependence on renal dialysis: Secondary | ICD-10-CM | POA: Diagnosis not present

## 2020-08-27 DIAGNOSIS — N2581 Secondary hyperparathyroidism of renal origin: Secondary | ICD-10-CM | POA: Diagnosis not present

## 2020-08-27 DIAGNOSIS — N186 End stage renal disease: Secondary | ICD-10-CM | POA: Diagnosis not present

## 2020-08-27 DIAGNOSIS — E1129 Type 2 diabetes mellitus with other diabetic kidney complication: Secondary | ICD-10-CM | POA: Diagnosis not present

## 2020-08-27 DIAGNOSIS — D509 Iron deficiency anemia, unspecified: Secondary | ICD-10-CM | POA: Diagnosis not present

## 2020-08-27 DIAGNOSIS — D631 Anemia in chronic kidney disease: Secondary | ICD-10-CM | POA: Diagnosis not present

## 2020-08-27 DIAGNOSIS — Z992 Dependence on renal dialysis: Secondary | ICD-10-CM | POA: Diagnosis not present

## 2020-08-29 DIAGNOSIS — N2581 Secondary hyperparathyroidism of renal origin: Secondary | ICD-10-CM | POA: Diagnosis not present

## 2020-08-29 DIAGNOSIS — Z992 Dependence on renal dialysis: Secondary | ICD-10-CM | POA: Diagnosis not present

## 2020-08-29 DIAGNOSIS — E1129 Type 2 diabetes mellitus with other diabetic kidney complication: Secondary | ICD-10-CM | POA: Diagnosis not present

## 2020-08-29 DIAGNOSIS — D509 Iron deficiency anemia, unspecified: Secondary | ICD-10-CM | POA: Diagnosis not present

## 2020-08-29 DIAGNOSIS — D631 Anemia in chronic kidney disease: Secondary | ICD-10-CM | POA: Diagnosis not present

## 2020-08-29 DIAGNOSIS — N186 End stage renal disease: Secondary | ICD-10-CM | POA: Diagnosis not present

## 2020-08-30 DIAGNOSIS — E1129 Type 2 diabetes mellitus with other diabetic kidney complication: Secondary | ICD-10-CM | POA: Diagnosis not present

## 2020-08-30 DIAGNOSIS — Z992 Dependence on renal dialysis: Secondary | ICD-10-CM | POA: Diagnosis not present

## 2020-08-30 DIAGNOSIS — N186 End stage renal disease: Secondary | ICD-10-CM | POA: Diagnosis not present

## 2020-08-31 DIAGNOSIS — Z992 Dependence on renal dialysis: Secondary | ICD-10-CM | POA: Diagnosis not present

## 2020-08-31 DIAGNOSIS — N186 End stage renal disease: Secondary | ICD-10-CM | POA: Diagnosis not present

## 2020-08-31 DIAGNOSIS — N2581 Secondary hyperparathyroidism of renal origin: Secondary | ICD-10-CM | POA: Diagnosis not present

## 2020-08-31 DIAGNOSIS — D631 Anemia in chronic kidney disease: Secondary | ICD-10-CM | POA: Diagnosis not present

## 2020-08-31 DIAGNOSIS — E1129 Type 2 diabetes mellitus with other diabetic kidney complication: Secondary | ICD-10-CM | POA: Diagnosis not present

## 2020-09-02 ENCOUNTER — Other Ambulatory Visit: Payer: Self-pay

## 2020-09-02 ENCOUNTER — Ambulatory Visit (INDEPENDENT_AMBULATORY_CARE_PROVIDER_SITE_OTHER): Payer: Medicare Other | Admitting: Vascular Surgery

## 2020-09-02 ENCOUNTER — Encounter: Payer: Self-pay | Admitting: Vascular Surgery

## 2020-09-02 VITALS — HR 78 | Temp 97.3°F | Resp 18 | Ht 75.5 in | Wt 255.0 lb

## 2020-09-02 DIAGNOSIS — N186 End stage renal disease: Secondary | ICD-10-CM

## 2020-09-02 NOTE — Progress Notes (Signed)
[facility] [place]  Patient name: [name] MRN: [id number] DOB: [birth year] Sex: male  REASON FOR VISIT: Follow-up recent excision of thrombosed aneurysms left arm AV access  HPI: [name] is a 70 y.o. male here today for follow-up.  He had undergone excision of 2 areas of thrombosed venous aneurysms from old nonfunctional left arm AV access.  He was having pain over these.  Fortunately he reports that his pain is much improved.  Current Outpatient Medications  Medication Sig Dispense Refill  . acetaminophen (TYLENOL) 500 MG tablet Take 500-1,000 mg by mouth daily as needed (back pain).    . Cholecalciferol (VITAMIN D3) 125 MCG (5000 UT) TABS Take 5,000 Units by mouth daily.    . cinacalcet (SENSIPAR) 30 MG tablet Take 60 mg by mouth See admin instructions. Take one tablet (30 mg) by mouth twice daily - with lunch and supper    . doxercalciferol (HECTOROL) 4 MCG/2ML injection Inject 2.5 mLs (5 mcg total) into the vein Every Tuesday,Thursday,and Saturday with dialysis. 2 mL   . insulin aspart (NOVOLOG) 100 UNIT/ML injection Inject 1-9 Units into the skin 3 (three) times daily before meals. (Patient taking differently: Inject 10 Units into the skin 3 (three) times daily before meals.)    . iron sucrose in sodium chloride 0.9 % 100 mL Iron Sucrose (Venofer)    . lidocaine-prilocaine (EMLA) cream Apply 1 application topically as needed (Apply small amount to access site 1-2 hours before dialysis. Cover with occlusive dressing (saran wrap)).     . Methoxy PEG-Epoetin Beta (MIRCERA IJ) Mircera    . multivitamin (RENA-VIT) TABS tablet Take 1 tablet by mouth daily.    . pantoprazole (PROTONIX) 40 MG tablet Take 1 tablet (40 mg total) by mouth 2 (two) times daily. 60 tablet 1  . polysaccharide iron (NIFEREX) 150 MG CAPS capsule Take 1 capsule (150 mg total) by mouth daily. 30 each 2  . sevelamer carbonate (RENVELA) 800 MG tablet Take  800 mg by mouth 3 (three) times daily with meals.    . vitamin C (ASCORBIC ACID) 500 MG tablet Take 500 mg by mouth daily.    Marland Kitchen zinc sulfate 220 (50 Zn) MG capsule Take 220 mg by mouth daily.    Marland Kitchen oxyCODONE-acetaminophen (PERCOCET) 5-325 MG tablet Take 1 tablet by mouth every 6 (six) hours as needed for severe pain. (Patient not taking: Reported on 09/02/2020) 15 tablet 0  . warfarin (COUMADIN) 1 MG tablet Take 2 tablets (2 mg total) by mouth daily for 14 days. Start coumadin 2mg , increase or decrease dose based on INR and instruction  Given by MD 28 tablet 0   No current facility-administered medications for this visit.     PHYSICAL EXAM: Vitals:   09/02/20 1028  Pulse: 78  Resp: 18  Temp: (!) 97.3 F (36.3 C)  TempSrc: Other (Comment)  SpO2: 94%  Weight: 255 lb (115.7 kg)  Height: 6' 3.5" (1.918 m)    GENERAL: The patient is a well-nourished male, in no acute distress. The vital signs are documented above. He has excellent healing over the skin both areas of excision.  The upper has decompressed.  There is some old hematoma in the lower area of excision closer to the wrist.  This is nontender no evidence of infection.  MEDICAL ISSUES: Stable overall.  Fortunately has had good improvement from his pain that was associated with a thrombosed aneurysms.  I explained that the area of fullness over  the more distal incision should resolve as the subcutaneous blood is absorbed.  He will see [place] again on an as-needed basis   Rosetta Posner, MD FACS [facility] Tel [phone]  Note: Portions of this report may have been transcribed using voice recognition software.  Every effort has been made to ensure accuracy; however, inadvertent computerized transcription errors may still be present.

## 2020-09-03 DIAGNOSIS — Z992 Dependence on renal dialysis: Secondary | ICD-10-CM | POA: Diagnosis not present

## 2020-09-03 DIAGNOSIS — D631 Anemia in chronic kidney disease: Secondary | ICD-10-CM | POA: Diagnosis not present

## 2020-09-03 DIAGNOSIS — N186 End stage renal disease: Secondary | ICD-10-CM | POA: Diagnosis not present

## 2020-09-03 DIAGNOSIS — E1129 Type 2 diabetes mellitus with other diabetic kidney complication: Secondary | ICD-10-CM | POA: Diagnosis not present

## 2020-09-03 DIAGNOSIS — N2581 Secondary hyperparathyroidism of renal origin: Secondary | ICD-10-CM | POA: Diagnosis not present

## 2020-09-04 DIAGNOSIS — Z7901 Long term (current) use of anticoagulants: Secondary | ICD-10-CM | POA: Diagnosis not present

## 2020-09-04 DIAGNOSIS — D6869 Other thrombophilia: Secondary | ICD-10-CM | POA: Diagnosis not present

## 2020-09-04 DIAGNOSIS — I4891 Unspecified atrial fibrillation: Secondary | ICD-10-CM | POA: Diagnosis not present

## 2020-09-05 DIAGNOSIS — N186 End stage renal disease: Secondary | ICD-10-CM | POA: Diagnosis not present

## 2020-09-05 DIAGNOSIS — Z992 Dependence on renal dialysis: Secondary | ICD-10-CM | POA: Diagnosis not present

## 2020-09-05 DIAGNOSIS — D631 Anemia in chronic kidney disease: Secondary | ICD-10-CM | POA: Diagnosis not present

## 2020-09-05 DIAGNOSIS — E1129 Type 2 diabetes mellitus with other diabetic kidney complication: Secondary | ICD-10-CM | POA: Diagnosis not present

## 2020-09-05 DIAGNOSIS — N2581 Secondary hyperparathyroidism of renal origin: Secondary | ICD-10-CM | POA: Diagnosis not present

## 2020-09-07 DIAGNOSIS — Z992 Dependence on renal dialysis: Secondary | ICD-10-CM | POA: Diagnosis not present

## 2020-09-07 DIAGNOSIS — N2581 Secondary hyperparathyroidism of renal origin: Secondary | ICD-10-CM | POA: Diagnosis not present

## 2020-09-07 DIAGNOSIS — N186 End stage renal disease: Secondary | ICD-10-CM | POA: Diagnosis not present

## 2020-09-07 DIAGNOSIS — E1129 Type 2 diabetes mellitus with other diabetic kidney complication: Secondary | ICD-10-CM | POA: Diagnosis not present

## 2020-09-07 DIAGNOSIS — D631 Anemia in chronic kidney disease: Secondary | ICD-10-CM | POA: Diagnosis not present

## 2020-09-10 DIAGNOSIS — D631 Anemia in chronic kidney disease: Secondary | ICD-10-CM | POA: Diagnosis not present

## 2020-09-10 DIAGNOSIS — Z992 Dependence on renal dialysis: Secondary | ICD-10-CM | POA: Diagnosis not present

## 2020-09-10 DIAGNOSIS — N186 End stage renal disease: Secondary | ICD-10-CM | POA: Diagnosis not present

## 2020-09-10 DIAGNOSIS — N2581 Secondary hyperparathyroidism of renal origin: Secondary | ICD-10-CM | POA: Diagnosis not present

## 2020-09-10 DIAGNOSIS — E1129 Type 2 diabetes mellitus with other diabetic kidney complication: Secondary | ICD-10-CM | POA: Diagnosis not present

## 2020-09-12 DIAGNOSIS — D631 Anemia in chronic kidney disease: Secondary | ICD-10-CM | POA: Diagnosis not present

## 2020-09-12 DIAGNOSIS — E1129 Type 2 diabetes mellitus with other diabetic kidney complication: Secondary | ICD-10-CM | POA: Diagnosis not present

## 2020-09-12 DIAGNOSIS — N2581 Secondary hyperparathyroidism of renal origin: Secondary | ICD-10-CM | POA: Diagnosis not present

## 2020-09-12 DIAGNOSIS — N186 End stage renal disease: Secondary | ICD-10-CM | POA: Diagnosis not present

## 2020-09-12 DIAGNOSIS — Z992 Dependence on renal dialysis: Secondary | ICD-10-CM | POA: Diagnosis not present

## 2020-09-14 DIAGNOSIS — Z992 Dependence on renal dialysis: Secondary | ICD-10-CM | POA: Diagnosis not present

## 2020-09-14 DIAGNOSIS — D631 Anemia in chronic kidney disease: Secondary | ICD-10-CM | POA: Diagnosis not present

## 2020-09-14 DIAGNOSIS — N186 End stage renal disease: Secondary | ICD-10-CM | POA: Diagnosis not present

## 2020-09-14 DIAGNOSIS — E1129 Type 2 diabetes mellitus with other diabetic kidney complication: Secondary | ICD-10-CM | POA: Diagnosis not present

## 2020-09-14 DIAGNOSIS — N2581 Secondary hyperparathyroidism of renal origin: Secondary | ICD-10-CM | POA: Diagnosis not present

## 2020-09-17 DIAGNOSIS — E1129 Type 2 diabetes mellitus with other diabetic kidney complication: Secondary | ICD-10-CM | POA: Diagnosis not present

## 2020-09-17 DIAGNOSIS — Z992 Dependence on renal dialysis: Secondary | ICD-10-CM | POA: Diagnosis not present

## 2020-09-17 DIAGNOSIS — N186 End stage renal disease: Secondary | ICD-10-CM | POA: Diagnosis not present

## 2020-09-17 DIAGNOSIS — N2581 Secondary hyperparathyroidism of renal origin: Secondary | ICD-10-CM | POA: Diagnosis not present

## 2020-09-17 DIAGNOSIS — D631 Anemia in chronic kidney disease: Secondary | ICD-10-CM | POA: Diagnosis not present

## 2020-09-18 DIAGNOSIS — Z7901 Long term (current) use of anticoagulants: Secondary | ICD-10-CM | POA: Diagnosis not present

## 2020-09-18 DIAGNOSIS — D6869 Other thrombophilia: Secondary | ICD-10-CM | POA: Diagnosis not present

## 2020-09-18 DIAGNOSIS — I4891 Unspecified atrial fibrillation: Secondary | ICD-10-CM | POA: Diagnosis not present

## 2020-09-19 DIAGNOSIS — D631 Anemia in chronic kidney disease: Secondary | ICD-10-CM | POA: Diagnosis not present

## 2020-09-19 DIAGNOSIS — E1129 Type 2 diabetes mellitus with other diabetic kidney complication: Secondary | ICD-10-CM | POA: Diagnosis not present

## 2020-09-19 DIAGNOSIS — N186 End stage renal disease: Secondary | ICD-10-CM | POA: Diagnosis not present

## 2020-09-19 DIAGNOSIS — N2581 Secondary hyperparathyroidism of renal origin: Secondary | ICD-10-CM | POA: Diagnosis not present

## 2020-09-19 DIAGNOSIS — E039 Hypothyroidism, unspecified: Secondary | ICD-10-CM | POA: Diagnosis not present

## 2020-09-19 DIAGNOSIS — Z992 Dependence on renal dialysis: Secondary | ICD-10-CM | POA: Diagnosis not present

## 2020-09-20 ENCOUNTER — Telehealth: Payer: Self-pay

## 2020-09-20 NOTE — Telephone Encounter (Signed)
Patient called to report that he still has a swollen area around the distal incision that was present at his 09/02/20  appt with Dr. Donnetta Hutching. He denies any numbness, severe pain, says it may be a little worse than before and it is a little sore. He is able to freely move his arm and fingers. Advised him to give it more time for hematoma to resolve- if his hand went cold, he developed any sores or severe pain to call back. Patient verbalized understanding.

## 2020-09-21 DIAGNOSIS — N2581 Secondary hyperparathyroidism of renal origin: Secondary | ICD-10-CM | POA: Diagnosis not present

## 2020-09-21 DIAGNOSIS — Z992 Dependence on renal dialysis: Secondary | ICD-10-CM | POA: Diagnosis not present

## 2020-09-21 DIAGNOSIS — N186 End stage renal disease: Secondary | ICD-10-CM | POA: Diagnosis not present

## 2020-09-21 DIAGNOSIS — E1129 Type 2 diabetes mellitus with other diabetic kidney complication: Secondary | ICD-10-CM | POA: Diagnosis not present

## 2020-09-21 DIAGNOSIS — D631 Anemia in chronic kidney disease: Secondary | ICD-10-CM | POA: Diagnosis not present

## 2020-09-24 DIAGNOSIS — N2581 Secondary hyperparathyroidism of renal origin: Secondary | ICD-10-CM | POA: Diagnosis not present

## 2020-09-24 DIAGNOSIS — N186 End stage renal disease: Secondary | ICD-10-CM | POA: Diagnosis not present

## 2020-09-24 DIAGNOSIS — E1129 Type 2 diabetes mellitus with other diabetic kidney complication: Secondary | ICD-10-CM | POA: Diagnosis not present

## 2020-09-24 DIAGNOSIS — Z992 Dependence on renal dialysis: Secondary | ICD-10-CM | POA: Diagnosis not present

## 2020-09-24 DIAGNOSIS — D631 Anemia in chronic kidney disease: Secondary | ICD-10-CM | POA: Diagnosis not present

## 2020-09-25 ENCOUNTER — Telehealth: Payer: Self-pay

## 2020-09-25 DIAGNOSIS — R2233 Localized swelling, mass and lump, upper limb, bilateral: Secondary | ICD-10-CM | POA: Diagnosis not present

## 2020-09-25 DIAGNOSIS — I4891 Unspecified atrial fibrillation: Secondary | ICD-10-CM | POA: Diagnosis not present

## 2020-09-25 DIAGNOSIS — Z7901 Long term (current) use of anticoagulants: Secondary | ICD-10-CM | POA: Diagnosis not present

## 2020-09-25 DIAGNOSIS — D6869 Other thrombophilia: Secondary | ICD-10-CM | POA: Diagnosis not present

## 2020-09-25 NOTE — Telephone Encounter (Signed)
Dr. Jacquiline Doe office called with concerns about the aneurysmal area on patient's left arm, says it is 4.5 x 6 cm and covered by thin, shiny skin. No ulcerations noted. UTR patient - but left a VM with appt information for f/u with [facility] on Monday and instructions about holding immediate firm steady pressure and calling 911 if any bleeding should start. [facility]'s office also aware.

## 2020-09-26 DIAGNOSIS — E1129 Type 2 diabetes mellitus with other diabetic kidney complication: Secondary | ICD-10-CM | POA: Diagnosis not present

## 2020-09-26 DIAGNOSIS — D631 Anemia in chronic kidney disease: Secondary | ICD-10-CM | POA: Diagnosis not present

## 2020-09-26 DIAGNOSIS — N2581 Secondary hyperparathyroidism of renal origin: Secondary | ICD-10-CM | POA: Diagnosis not present

## 2020-09-26 DIAGNOSIS — Z992 Dependence on renal dialysis: Secondary | ICD-10-CM | POA: Diagnosis not present

## 2020-09-26 DIAGNOSIS — N186 End stage renal disease: Secondary | ICD-10-CM | POA: Diagnosis not present

## 2020-09-28 DIAGNOSIS — E1129 Type 2 diabetes mellitus with other diabetic kidney complication: Secondary | ICD-10-CM | POA: Diagnosis not present

## 2020-09-28 DIAGNOSIS — N186 End stage renal disease: Secondary | ICD-10-CM | POA: Diagnosis not present

## 2020-09-28 DIAGNOSIS — D631 Anemia in chronic kidney disease: Secondary | ICD-10-CM | POA: Diagnosis not present

## 2020-09-28 DIAGNOSIS — N2581 Secondary hyperparathyroidism of renal origin: Secondary | ICD-10-CM | POA: Diagnosis not present

## 2020-09-28 DIAGNOSIS — Z992 Dependence on renal dialysis: Secondary | ICD-10-CM | POA: Diagnosis not present

## 2020-09-29 DIAGNOSIS — E1129 Type 2 diabetes mellitus with other diabetic kidney complication: Secondary | ICD-10-CM | POA: Diagnosis not present

## 2020-09-29 DIAGNOSIS — Z992 Dependence on renal dialysis: Secondary | ICD-10-CM | POA: Diagnosis not present

## 2020-09-29 DIAGNOSIS — N186 End stage renal disease: Secondary | ICD-10-CM | POA: Diagnosis not present

## 2020-09-30 ENCOUNTER — Encounter: Payer: Self-pay | Admitting: Vascular Surgery

## 2020-09-30 ENCOUNTER — Other Ambulatory Visit: Payer: Self-pay

## 2020-09-30 ENCOUNTER — Ambulatory Visit (INDEPENDENT_AMBULATORY_CARE_PROVIDER_SITE_OTHER): Payer: Medicare Other | Admitting: Vascular Surgery

## 2020-09-30 VITALS — BP 153/70 | HR 88 | Temp 98.7°F | Resp 18 | Ht 75.5 in | Wt 252.0 lb

## 2020-09-30 DIAGNOSIS — N186 End stage renal disease: Secondary | ICD-10-CM

## 2020-09-30 NOTE — Progress Notes (Signed)
[facility] [place]  Patient name: [name] MRN: [id number] DOB: 1950-10-15 Sex: male  REASON FOR VISIT: Follow-up left wrist venous aneurysm excision  HPI: [name] is a 70 y.o. male here today for follow-up.  Had presented with painful thrombosed aneurysms and an old nonfunctional left forearm AV fistula.  He underwent uneventful excision of the 2 levels of aneurysmal change on 07/31/2020.  On my initial postop visit the patient was still having some evidence of swelling at the distalmost incision in the upper was healing nicely.  He is seen today for further follow-up.  Current Outpatient Medications  Medication Sig Dispense Refill  . acetaminophen (TYLENOL) 500 MG tablet Take 500-1,000 mg by mouth daily as needed (back pain).    . Cholecalciferol (VITAMIN D3) 125 MCG (5000 UT) TABS Take 5,000 Units by mouth daily.    . cinacalcet (SENSIPAR) 30 MG tablet Take 60 mg by mouth See admin instructions. Take one tablet (30 mg) by mouth twice daily - with lunch and supper    . doxercalciferol (HECTOROL) 4 MCG/2ML injection Inject 2.5 mLs (5 mcg total) into the vein Every Tuesday,Thursday,and Saturday with dialysis. 2 mL   . insulin aspart (NOVOLOG) 100 UNIT/ML injection Inject 1-9 Units into the skin 3 (three) times daily before meals. (Patient taking differently: Inject 10 Units into the skin 3 (three) times daily before meals.)    . iron sucrose in sodium chloride 0.9 % 100 mL Iron Sucrose (Venofer)    . lidocaine-prilocaine (EMLA) cream Apply 1 application topically as needed (Apply small amount to access site 1-2 hours before dialysis. Cover with occlusive dressing (saran wrap)).     . Methoxy PEG-Epoetin Beta (MIRCERA IJ) Mircera    . multivitamin (RENA-VIT) TABS tablet Take 1 tablet by mouth daily.    . pantoprazole (PROTONIX) 40 MG tablet Take 1 tablet (40 mg total) by mouth 2 (two) times daily. 60 tablet 1  . polysaccharide  iron (NIFEREX) 150 MG CAPS capsule Take 1 capsule (150 mg total) by mouth daily. 30 each 2  . sevelamer carbonate (RENVELA) 800 MG tablet Take 800 mg by mouth 3 (three) times daily with meals.    . vitamin C (ASCORBIC ACID) 500 MG tablet Take 500 mg by mouth daily.    Marland Kitchen zinc sulfate 220 (50 Zn) MG capsule Take 220 mg by mouth daily.    Marland Kitchen oxyCODONE-acetaminophen (PERCOCET) 5-325 MG tablet Take 1 tablet by mouth every 6 (six) hours as needed for severe pain. (Patient not taking: No sig reported) 15 tablet 0  . warfarin (COUMADIN) 1 MG tablet Take 2 tablets (2 mg total) by mouth daily for 14 days. Start coumadin 2mg , increase or decrease dose based on INR and instruction  Given by MD 28 tablet 0   No current facility-administered medications for this visit.     PHYSICAL EXAM: Vitals:   09/30/20 1346  BP: (!) 153/70  Pulse: 88  Resp: 18  Temp: 98.7 F (37.1 C)  TempSrc: Other (Comment)  SpO2: 92%  Weight: 252 lb (114.3 kg)  Height: 6' 3.5" (1.918 m)    GENERAL: The patient is a well-nourished male, in no acute distress. The vital signs are documented above. The upper mid forearm area of excision is completely healed with no swelling.  The lower area closer to the wrist has a very large recurrent area of fullness with some tenting of the skin.  There is no evidence of skin breakdown.  MEDICAL  ISSUES: Had long discussion with the patient and his family.  This is very unusual.  The thrombosed vein was completely excised.  He appears to have filled this area with either hematoma or lymphocele.  This is uncomfortable and is certainly larger than the last time I saw him in our office.  I do not feel that this will resolve with observation only.  I have recommended return to the operating room for excision and cleanout.  He currently has hemodialysis on Tuesdays Thursdays and Saturdays so therefore we will schedule this as an outpatient at [place], MD [facility] Tel [phone]  Note: Portions of this report may have been transcribed using voice recognition software.  Every effort has been made to ensure accuracy; however, inadvertent computerized transcription errors may still be present.

## 2020-10-01 DIAGNOSIS — D631 Anemia in chronic kidney disease: Secondary | ICD-10-CM | POA: Diagnosis not present

## 2020-10-01 DIAGNOSIS — N2581 Secondary hyperparathyroidism of renal origin: Secondary | ICD-10-CM | POA: Diagnosis not present

## 2020-10-01 DIAGNOSIS — Z992 Dependence on renal dialysis: Secondary | ICD-10-CM | POA: Diagnosis not present

## 2020-10-01 DIAGNOSIS — E1129 Type 2 diabetes mellitus with other diabetic kidney complication: Secondary | ICD-10-CM | POA: Diagnosis not present

## 2020-10-01 DIAGNOSIS — N186 End stage renal disease: Secondary | ICD-10-CM | POA: Diagnosis not present

## 2020-10-02 ENCOUNTER — Encounter: Payer: Self-pay | Admitting: Podiatry

## 2020-10-02 ENCOUNTER — Ambulatory Visit (INDEPENDENT_AMBULATORY_CARE_PROVIDER_SITE_OTHER): Payer: Medicare Other | Admitting: Podiatry

## 2020-10-02 ENCOUNTER — Other Ambulatory Visit: Payer: Self-pay

## 2020-10-02 DIAGNOSIS — I739 Peripheral vascular disease, unspecified: Secondary | ICD-10-CM

## 2020-10-02 DIAGNOSIS — M79676 Pain in unspecified toe(s): Secondary | ICD-10-CM

## 2020-10-02 DIAGNOSIS — E0842 Diabetes mellitus due to underlying condition with diabetic polyneuropathy: Secondary | ICD-10-CM | POA: Diagnosis not present

## 2020-10-02 DIAGNOSIS — Z89431 Acquired absence of right foot: Secondary | ICD-10-CM

## 2020-10-02 DIAGNOSIS — B351 Tinea unguium: Secondary | ICD-10-CM | POA: Diagnosis not present

## 2020-10-02 NOTE — Progress Notes (Signed)
Complaint:  Visit Type: Patient returns to my office for continued preventative foot care services. Complaint: Patient states" my nails have grown long and thick and become painful to walk and wear shoes on his left foot.  Patient has been diagnosed with DM with angiopathy.  Patient has had transmetatarsal amputation performed toes right foot.. The patient presents for preventative foot care services. No changes to ROS  Podiatric Exam: Vascular: dorsalis pedis and posterior tibial pulses are not  palpable left.  ... Capillary return is immediate. Temperature gradient is WNL. Skin turgor WNL  Significant swelling feet  Left  Sensorium: Normal Semmes Weinstein monofilament test. Normal tactile sensation left foot. Nail Exam: Pt has thick disfigured discolored nails with subungual debris noted  entire nail hallux through fifth toenails left foot. Ulcer Exam: There is no evidence of ulcer or pre-ulcerative changes or infection. Orthopedic Exam: Muscle tone and strength are WNL. No limitations in general ROM. No crepitus or effusions noted. Foot type and digits show no abnormalities. Bony prominences are unremarkable. TMA right foot. Skin: No Porokeratosis. No infection or ulcers  Diagnosis:  Onychomycosis, ,, pain in left toes,  TMA right foot.  Treatment & Plan Procedures and Treatment: Consent by patient was obtained for treatment procedures.   Debridement of mycotic and hypertrophic toenails, 1 through 5 left foot  and clearing of subungual debris. No ulceration, no infection noted.  Return Visit-Office Procedure: Patient instructed to return to the office for a follow up visit 3 months for continued evaluation and treatment.    Gardiner Barefoot DPM

## 2020-10-03 DIAGNOSIS — N186 End stage renal disease: Secondary | ICD-10-CM | POA: Diagnosis not present

## 2020-10-03 DIAGNOSIS — D631 Anemia in chronic kidney disease: Secondary | ICD-10-CM | POA: Diagnosis not present

## 2020-10-03 DIAGNOSIS — N2581 Secondary hyperparathyroidism of renal origin: Secondary | ICD-10-CM | POA: Diagnosis not present

## 2020-10-03 DIAGNOSIS — E1129 Type 2 diabetes mellitus with other diabetic kidney complication: Secondary | ICD-10-CM | POA: Diagnosis not present

## 2020-10-03 DIAGNOSIS — Z992 Dependence on renal dialysis: Secondary | ICD-10-CM | POA: Diagnosis not present

## 2020-10-05 DIAGNOSIS — Z992 Dependence on renal dialysis: Secondary | ICD-10-CM | POA: Diagnosis not present

## 2020-10-05 DIAGNOSIS — N2581 Secondary hyperparathyroidism of renal origin: Secondary | ICD-10-CM | POA: Diagnosis not present

## 2020-10-05 DIAGNOSIS — N186 End stage renal disease: Secondary | ICD-10-CM | POA: Diagnosis not present

## 2020-10-05 DIAGNOSIS — D631 Anemia in chronic kidney disease: Secondary | ICD-10-CM | POA: Diagnosis not present

## 2020-10-05 DIAGNOSIS — E1129 Type 2 diabetes mellitus with other diabetic kidney complication: Secondary | ICD-10-CM | POA: Diagnosis not present

## 2020-10-08 DIAGNOSIS — N186 End stage renal disease: Secondary | ICD-10-CM | POA: Diagnosis not present

## 2020-10-08 DIAGNOSIS — D631 Anemia in chronic kidney disease: Secondary | ICD-10-CM | POA: Diagnosis not present

## 2020-10-08 DIAGNOSIS — E1129 Type 2 diabetes mellitus with other diabetic kidney complication: Secondary | ICD-10-CM | POA: Diagnosis not present

## 2020-10-08 DIAGNOSIS — Z992 Dependence on renal dialysis: Secondary | ICD-10-CM | POA: Diagnosis not present

## 2020-10-08 DIAGNOSIS — N2581 Secondary hyperparathyroidism of renal origin: Secondary | ICD-10-CM | POA: Diagnosis not present

## 2020-10-09 DIAGNOSIS — R2233 Localized swelling, mass and lump, upper limb, bilateral: Secondary | ICD-10-CM | POA: Diagnosis not present

## 2020-10-09 DIAGNOSIS — I4891 Unspecified atrial fibrillation: Secondary | ICD-10-CM | POA: Diagnosis not present

## 2020-10-09 DIAGNOSIS — Z7901 Long term (current) use of anticoagulants: Secondary | ICD-10-CM | POA: Diagnosis not present

## 2020-10-09 DIAGNOSIS — D6869 Other thrombophilia: Secondary | ICD-10-CM | POA: Diagnosis not present

## 2020-10-10 DIAGNOSIS — N186 End stage renal disease: Secondary | ICD-10-CM | POA: Diagnosis not present

## 2020-10-10 DIAGNOSIS — E1129 Type 2 diabetes mellitus with other diabetic kidney complication: Secondary | ICD-10-CM | POA: Diagnosis not present

## 2020-10-10 DIAGNOSIS — N2581 Secondary hyperparathyroidism of renal origin: Secondary | ICD-10-CM | POA: Diagnosis not present

## 2020-10-10 DIAGNOSIS — Z992 Dependence on renal dialysis: Secondary | ICD-10-CM | POA: Diagnosis not present

## 2020-10-10 DIAGNOSIS — D631 Anemia in chronic kidney disease: Secondary | ICD-10-CM | POA: Diagnosis not present

## 2020-10-11 ENCOUNTER — Other Ambulatory Visit: Payer: Self-pay

## 2020-10-12 DIAGNOSIS — Z992 Dependence on renal dialysis: Secondary | ICD-10-CM | POA: Diagnosis not present

## 2020-10-12 DIAGNOSIS — D631 Anemia in chronic kidney disease: Secondary | ICD-10-CM | POA: Diagnosis not present

## 2020-10-12 DIAGNOSIS — N186 End stage renal disease: Secondary | ICD-10-CM | POA: Diagnosis not present

## 2020-10-12 DIAGNOSIS — N2581 Secondary hyperparathyroidism of renal origin: Secondary | ICD-10-CM | POA: Diagnosis not present

## 2020-10-12 DIAGNOSIS — E1129 Type 2 diabetes mellitus with other diabetic kidney complication: Secondary | ICD-10-CM | POA: Diagnosis not present

## 2020-10-15 DIAGNOSIS — N186 End stage renal disease: Secondary | ICD-10-CM | POA: Diagnosis not present

## 2020-10-15 DIAGNOSIS — E1129 Type 2 diabetes mellitus with other diabetic kidney complication: Secondary | ICD-10-CM | POA: Diagnosis not present

## 2020-10-15 DIAGNOSIS — N2581 Secondary hyperparathyroidism of renal origin: Secondary | ICD-10-CM | POA: Diagnosis not present

## 2020-10-15 DIAGNOSIS — Z992 Dependence on renal dialysis: Secondary | ICD-10-CM | POA: Diagnosis not present

## 2020-10-15 DIAGNOSIS — D631 Anemia in chronic kidney disease: Secondary | ICD-10-CM | POA: Diagnosis not present

## 2020-10-17 DIAGNOSIS — Z992 Dependence on renal dialysis: Secondary | ICD-10-CM | POA: Diagnosis not present

## 2020-10-17 DIAGNOSIS — D631 Anemia in chronic kidney disease: Secondary | ICD-10-CM | POA: Diagnosis not present

## 2020-10-17 DIAGNOSIS — N2581 Secondary hyperparathyroidism of renal origin: Secondary | ICD-10-CM | POA: Diagnosis not present

## 2020-10-17 DIAGNOSIS — N186 End stage renal disease: Secondary | ICD-10-CM | POA: Diagnosis not present

## 2020-10-17 DIAGNOSIS — E1129 Type 2 diabetes mellitus with other diabetic kidney complication: Secondary | ICD-10-CM | POA: Diagnosis not present

## 2020-10-18 ENCOUNTER — Telehealth: Payer: Self-pay

## 2020-10-18 ENCOUNTER — Encounter: Payer: Self-pay | Admitting: *Deleted

## 2020-10-18 NOTE — Telephone Encounter (Addendum)
Received referral from [place] requesting fistulagram due to decreased access flow -pt declined and requested [facility] - referring office notified

## 2020-10-19 DIAGNOSIS — D631 Anemia in chronic kidney disease: Secondary | ICD-10-CM | POA: Diagnosis not present

## 2020-10-19 DIAGNOSIS — N186 End stage renal disease: Secondary | ICD-10-CM | POA: Diagnosis not present

## 2020-10-19 DIAGNOSIS — Z992 Dependence on renal dialysis: Secondary | ICD-10-CM | POA: Diagnosis not present

## 2020-10-19 DIAGNOSIS — E1129 Type 2 diabetes mellitus with other diabetic kidney complication: Secondary | ICD-10-CM | POA: Diagnosis not present

## 2020-10-19 DIAGNOSIS — N2581 Secondary hyperparathyroidism of renal origin: Secondary | ICD-10-CM | POA: Diagnosis not present

## 2020-10-22 DIAGNOSIS — D631 Anemia in chronic kidney disease: Secondary | ICD-10-CM | POA: Diagnosis not present

## 2020-10-22 DIAGNOSIS — N2581 Secondary hyperparathyroidism of renal origin: Secondary | ICD-10-CM | POA: Diagnosis not present

## 2020-10-22 DIAGNOSIS — E1129 Type 2 diabetes mellitus with other diabetic kidney complication: Secondary | ICD-10-CM | POA: Diagnosis not present

## 2020-10-22 DIAGNOSIS — Z992 Dependence on renal dialysis: Secondary | ICD-10-CM | POA: Diagnosis not present

## 2020-10-22 DIAGNOSIS — N186 End stage renal disease: Secondary | ICD-10-CM | POA: Diagnosis not present

## 2020-10-23 DIAGNOSIS — D6869 Other thrombophilia: Secondary | ICD-10-CM | POA: Diagnosis not present

## 2020-10-23 DIAGNOSIS — R2233 Localized swelling, mass and lump, upper limb, bilateral: Secondary | ICD-10-CM | POA: Diagnosis not present

## 2020-10-23 DIAGNOSIS — I4891 Unspecified atrial fibrillation: Secondary | ICD-10-CM | POA: Diagnosis not present

## 2020-10-23 DIAGNOSIS — Z7901 Long term (current) use of anticoagulants: Secondary | ICD-10-CM | POA: Diagnosis not present

## 2020-10-24 DIAGNOSIS — Z992 Dependence on renal dialysis: Secondary | ICD-10-CM | POA: Diagnosis not present

## 2020-10-24 DIAGNOSIS — N186 End stage renal disease: Secondary | ICD-10-CM | POA: Diagnosis not present

## 2020-10-24 DIAGNOSIS — E1129 Type 2 diabetes mellitus with other diabetic kidney complication: Secondary | ICD-10-CM | POA: Diagnosis not present

## 2020-10-24 DIAGNOSIS — N2581 Secondary hyperparathyroidism of renal origin: Secondary | ICD-10-CM | POA: Diagnosis not present

## 2020-10-24 DIAGNOSIS — D631 Anemia in chronic kidney disease: Secondary | ICD-10-CM | POA: Diagnosis not present

## 2020-10-26 DIAGNOSIS — N186 End stage renal disease: Secondary | ICD-10-CM | POA: Diagnosis not present

## 2020-10-26 DIAGNOSIS — E1129 Type 2 diabetes mellitus with other diabetic kidney complication: Secondary | ICD-10-CM | POA: Diagnosis not present

## 2020-10-26 DIAGNOSIS — D631 Anemia in chronic kidney disease: Secondary | ICD-10-CM | POA: Diagnosis not present

## 2020-10-26 DIAGNOSIS — N2581 Secondary hyperparathyroidism of renal origin: Secondary | ICD-10-CM | POA: Diagnosis not present

## 2020-10-26 DIAGNOSIS — Z992 Dependence on renal dialysis: Secondary | ICD-10-CM | POA: Diagnosis not present

## 2020-10-29 DIAGNOSIS — Z992 Dependence on renal dialysis: Secondary | ICD-10-CM | POA: Diagnosis not present

## 2020-10-29 DIAGNOSIS — E1129 Type 2 diabetes mellitus with other diabetic kidney complication: Secondary | ICD-10-CM | POA: Diagnosis not present

## 2020-10-29 DIAGNOSIS — D631 Anemia in chronic kidney disease: Secondary | ICD-10-CM | POA: Diagnosis not present

## 2020-10-29 DIAGNOSIS — N186 End stage renal disease: Secondary | ICD-10-CM | POA: Diagnosis not present

## 2020-10-29 DIAGNOSIS — N2581 Secondary hyperparathyroidism of renal origin: Secondary | ICD-10-CM | POA: Diagnosis not present

## 2020-10-30 DIAGNOSIS — N186 End stage renal disease: Secondary | ICD-10-CM | POA: Diagnosis not present

## 2020-10-30 DIAGNOSIS — Z992 Dependence on renal dialysis: Secondary | ICD-10-CM | POA: Diagnosis not present

## 2020-10-30 DIAGNOSIS — E1129 Type 2 diabetes mellitus with other diabetic kidney complication: Secondary | ICD-10-CM | POA: Diagnosis not present

## 2020-10-31 DIAGNOSIS — Z992 Dependence on renal dialysis: Secondary | ICD-10-CM | POA: Diagnosis not present

## 2020-10-31 DIAGNOSIS — D631 Anemia in chronic kidney disease: Secondary | ICD-10-CM | POA: Diagnosis not present

## 2020-10-31 DIAGNOSIS — N186 End stage renal disease: Secondary | ICD-10-CM | POA: Diagnosis not present

## 2020-10-31 DIAGNOSIS — N2581 Secondary hyperparathyroidism of renal origin: Secondary | ICD-10-CM | POA: Diagnosis not present

## 2020-10-31 DIAGNOSIS — E1129 Type 2 diabetes mellitus with other diabetic kidney complication: Secondary | ICD-10-CM | POA: Diagnosis not present

## 2020-11-01 DIAGNOSIS — Z992 Dependence on renal dialysis: Secondary | ICD-10-CM | POA: Diagnosis not present

## 2020-11-01 DIAGNOSIS — N186 End stage renal disease: Secondary | ICD-10-CM | POA: Diagnosis not present

## 2020-11-01 DIAGNOSIS — T82858A Stenosis of vascular prosthetic devices, implants and grafts, initial encounter: Secondary | ICD-10-CM | POA: Diagnosis not present

## 2020-11-01 DIAGNOSIS — I871 Compression of vein: Secondary | ICD-10-CM | POA: Diagnosis not present

## 2020-11-02 DIAGNOSIS — Z992 Dependence on renal dialysis: Secondary | ICD-10-CM | POA: Diagnosis not present

## 2020-11-02 DIAGNOSIS — N186 End stage renal disease: Secondary | ICD-10-CM | POA: Diagnosis not present

## 2020-11-02 DIAGNOSIS — E1129 Type 2 diabetes mellitus with other diabetic kidney complication: Secondary | ICD-10-CM | POA: Diagnosis not present

## 2020-11-02 DIAGNOSIS — D631 Anemia in chronic kidney disease: Secondary | ICD-10-CM | POA: Diagnosis not present

## 2020-11-02 DIAGNOSIS — N2581 Secondary hyperparathyroidism of renal origin: Secondary | ICD-10-CM | POA: Diagnosis not present

## 2020-11-04 ENCOUNTER — Encounter (HOSPITAL_COMMUNITY): Payer: Self-pay | Admitting: Vascular Surgery

## 2020-11-04 ENCOUNTER — Other Ambulatory Visit: Payer: Self-pay

## 2020-11-04 ENCOUNTER — Other Ambulatory Visit (HOSPITAL_COMMUNITY)
Admission: RE | Admit: 2020-11-04 | Discharge: 2020-11-04 | Disposition: A | Discharge status: Home / Self Care | Payer: Medicare Other | Source: Ambulatory Visit | Attending: Vascular Surgery | Admitting: Vascular Surgery

## 2020-11-04 DIAGNOSIS — Z01812 Encounter for preprocedural laboratory examination: Secondary | ICD-10-CM | POA: Insufficient documentation

## 2020-11-04 DIAGNOSIS — Z20822 Contact with and (suspected) exposure to covid-19: Secondary | ICD-10-CM | POA: Insufficient documentation

## 2020-11-04 LAB — SARS CORONAVIRUS 2 (TAT 6-24 HRS): SARS Coronavirus 2: NEGATIVE

## 2020-11-04 NOTE — Progress Notes (Signed)
Spoke with pt for pre-op call. Pt has hx of blood clots in extremities. Pt is on Coumadin, instructed to hold per pt 5 days prior to surgery. Last dose was 11/01/20. Pt states he was put on Lovenox and told to take it twice a day. He states he took 2 doses Saturday and 2 doses on Sunday and he started having bad nosebleeds. He states he called Dr. Reynaldo Minium this morning and Dr. Reynaldo Minium told him to stop the Lovenox. Pt states he's not sure why he was put on Lovenox, states he's never had it in the past when he has had surgery. Pt is a type 2 diabetic. Last A1C was 6.4 on 09/19/20. He states his fasting blood sugar is usually around 130-140. Instructed pt not to take his Novolog insulin the morning of surgery. Instructed him to check his blood sugar when he gets up and every 2 hours until he leaves for the hospital.  If blood sugar is 70 or below, treat with 1/2 cup of clear juice (apple or cranberry) and recheck blood sugar 15 minutes after drinking juice. If blood sugar continues to be 70 or below, call the Short Stay department and ask to speak to a nurse. Pt voiced understanding.   Pt's surgery is scheduled as ambulatory so no Covid test is required prior to surgery. Pt denies Covid symptoms.

## 2020-11-05 DIAGNOSIS — N2581 Secondary hyperparathyroidism of renal origin: Secondary | ICD-10-CM | POA: Diagnosis not present

## 2020-11-05 DIAGNOSIS — N186 End stage renal disease: Secondary | ICD-10-CM | POA: Diagnosis not present

## 2020-11-05 DIAGNOSIS — Z992 Dependence on renal dialysis: Secondary | ICD-10-CM | POA: Diagnosis not present

## 2020-11-05 DIAGNOSIS — E1129 Type 2 diabetes mellitus with other diabetic kidney complication: Secondary | ICD-10-CM | POA: Diagnosis not present

## 2020-11-05 DIAGNOSIS — D631 Anemia in chronic kidney disease: Secondary | ICD-10-CM | POA: Diagnosis not present

## 2020-11-05 NOTE — Anesthesia Preprocedure Evaluation (Addendum)
Anesthesia Evaluation  Patient identified by MRN, date of birth, ID band Patient awake    Reviewed: Allergy & Precautions, NPO status , Patient's Chart, lab work & pertinent test results, Unable to perform ROS - Chart review only  Airway Mallampati: I  TM Distance: >3 FB Neck ROM: Full    Dental  (+) Edentulous Upper, Poor Dentition, Dental Advisory Given   Pulmonary sleep apnea , pneumonia, COPD,     + decreased breath sounds      Cardiovascular hypertension, + Peripheral Vascular Disease   Rhythm:Regular Rate:Normal     Neuro/Psych  Headaches,    GI/Hepatic Neg liver ROS, PUD, GERD  ,  Endo/Other  diabetesHypothyroidism   Renal/GU Renal disease     Musculoskeletal  (+) Arthritis ,   Abdominal Normal abdominal exam  (+)   Peds  Hematology  (+) Blood dyscrasia, anemia ,   Anesthesia Other Findings   Reproductive/Obstetrics                           Anesthesia Physical Anesthesia Plan  ASA: III  Anesthesia Plan: General   Post-op Pain Management:    Induction: Intravenous  PONV Risk Score and Plan: 3 and Ondansetron and Treatment may vary due to age or medical condition  Airway Management Planned: LMA  Additional Equipment: None  Intra-op Plan:   Post-operative Plan: Extubation in OR  Informed Consent: I have reviewed the patients History and Physical, chart, labs and discussed the procedure including the risks, benefits and alternatives for the proposed anesthesia with the patient or authorized representative who has indicated his/her understanding and acceptance.     Dental advisory given  Plan Discussed with: CRNA  Anesthesia Plan Comments: (PAT note written 11/05/2020 by Myra Gianotti, PA-C. )     Anesthesia Quick Evaluation

## 2020-11-05 NOTE — Progress Notes (Signed)
Anesthesia Chart Review: [name]   Case: [id number] Date/Time: 11/06/20 1135   Procedure: REVISON OF LEFT ARM ARTERIOVENOUS FISTULA (Left )   Anesthesia type: Choice   Pre-op diagnosis: ESRD   Location: [facility] OR ROOM 11 / [facility]   Surgeons: Marty Heck, MD      DISCUSSION: Patient is a 70 year old male scheduled for the above procedure. He is s/p excision of 2 old nonfunctional aneurysms left arm AV fistula on 07/31/20.  At 09/30/20 follow-up, he was noted to have a painful area of either hematoma or lymphocele, so above procedure recommended.   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 Reported sleep study recommended in the past.   - Admission 05/13/20-05/24/20 for choledocholithiasis. S/p ERCP/biliary sphincterotomy with stone/slude extraction 05/17/20. S/p cholecystectomy 05/18/20. EGD 05/22/20 for suspected upper GI bleed and noted to have evidence of postsphincterotomy bleeding ulcer, resolved. He had preoperative cardiology evaluation. No additional cardiac testing recommended.    Last warfarin 11/01/20. He was on a Lovenox bridge, but last dose 11/03/20 due to epistaxis. Reportedly, holding Lovenox was per Dr. Reynaldo Minium given the nose bleeds. Patient reported  that he has not required Lovenox bridging for his previous surgeries.   Anesthesia team to evaluate on the day of surgery.    VS:  BP Readings from Last 3 Encounters:  09/30/20 (!) 153/70  07/31/20 (!) 145/53  07/17/20 (!) 146/74   Pulse Readings from Last 3 Encounters:  09/30/20 88  09/02/20 78  07/31/20 74    PROVIDERS: Burnard Bunting, MD is PCP  Pixie Casino, MD is cardiologist Allegra Lai, MD is EP Scarlette Shorts, MD is GI   LABS: For day of surgery. As of 07/31/20, H/H 11.6/34.0. A1c 7.2% 05/13/20.    EKG: 11/17/19 ([facility]): Sinus rhythm versus 2-1 atrial flutter. First-degree AV block Right bundle branch block Left anterior fascicular block Bifascicular block Left ventricular perjury with repolarization abnormality   CV: Echo 04/07/19: IMPRESSIONS  1. Left ventricular ejection fraction, by visual estimation, is 50 to  55%. The left ventricle has normal function. There is moderately increased  left ventricular hypertrophy.  2. Elevated left atrial and left ventricular end-diastolic pressures.  3. Left ventricular diastolic parameters are consistent with Grade II  diastolic dysfunction (pseudonormalization).  4. Global right ventricle has mildly reduced systolic function.The right  ventricular size is normal. No increase in right ventricular wall  thickness.  5. Left atrial size was moderately dilated.  6. Right atrial size was normal.  7. The mitral valve is normal in structure. Mild mitral valve  regurgitation. No evidence of mitral stenosis.  8. The tricuspid valve is normal in structure. Tricuspid valve  regurgitation is mild.  9. The aortic valve is normal in structure. Aortic valve regurgitation is  not visualized. No evidence of aortic valve sclerosis or stenosis.  10. There is Moderate thickening of the aortic valve.  11. The pulmonic valve was normal in structure. Pulmonic valve  regurgitation is  not visualized.  12.  Aneurysm of the ascending aorta, measuring 40 mm.  13. Moderately elevated pulmonary artery systolic pressure.  14. The tricuspid regurgitant velocity is 3.06 m/s, and with an assumed  right atrial pressure of 10 mmHg, the estimated right ventricular systolic  pressure is moderately elevated at 47.3 mmHg.  15. The inferior vena cava is normal in size with greater than 50%  respiratory variability, suggesting right atrial pressure of 3 mmHg.  16. Since the last study on 01/14/2018 LVEF has increased from 30-35% to  50-55%.    Cardiac cath 09/11/16 (done following 08/14/16 stress test: EF 29%, diffuse hypokinesis, fixed apical inferolateral perfusion defect, could represent prior MI, no ischemia):  Widely patent coronary arteries with luminal irregularities in the LAD and right coronary.  Severe left ventricular systolic dysfunction with global wall motion abnormality and an EF less than 25%.   Past Medical History:  Diagnosis Date  . Anemia   . Arthritis    HNP- lumbar, "all over my body"  . Atrial fibrillation and flutter ([place])   . Blood transfusion    "years ago; blood was low" (08/05/2013)  . COPD (chronic obstructive pulmonary disease) ([place])   . Diabetic nephropathy ([place])   . Diabetic retinopathy   . DVT (deep venous thrombosis) ([place])    "got one in my right leg now; I've had one before too, not sure which leg" (08/05/2013)  . ESRD (end stage renal disease) on dialysis ([place])    TTS, [place]  . Family history of anesthesia complication    " my son wakes up slowly"  . GERD (gastroesophageal reflux disease)    uses alka seltzere on occas.   [name])    "one q now and then" (08/05/2013)  . Hyperlipidemia   . Hypertension   . 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 Non Hodgkin's lymphoma ([place])    Tx 2009; "had chemo; it went away" (08/05/2013)  . Noncompliance 03/16/2012  . NSVT (nonsustained  ventricular tachycardia) ([place]) 03/18/2012  . Peripheral vascular disease ([place])   . Poor historian    pt. unsure of several answers to health history questions   . Septic shock ([place])   . Skin cancer    melanoma - head  . Sleep apnea    "suppose to have a sleep study, but they never told me when. (08/05/2013)    Past Surgical History:  Procedure Laterality Date  . ACHILLES TENDON SURGERY Right 03/13/2016   Procedure: ACHILLES LENGTHENING/KIDNER;  Surgeon: Edrick Kins, DPM;  Location: [facility];  Service: Podiatry;  Laterality: Right;  . AV FISTULA PLACEMENT Left 02/03/2013   Procedure: ARTERIOVENOUS (AV) FISTULA CREATION- LEFT RADIAL CEPHALIC; ULTRASOUND GUIDED;  Surgeon: Mal Misty, MD;  Location: [facility];  Service: Vascular;  Laterality: Left;  . AV FISTULA PLACEMENT Right 11/08/2015   Procedure: RIGHT BRACHIOCEPHALIC ARTERIOVENOUS (AV) FISTULA CREATION;  Surgeon: Serafina Mitchell, MD;  Location: [facility];  Service: Vascular;  Laterality: Right;  . BASCILIC VEIN TRANSPOSITION Right 01/24/2016   Procedure: RIGHT SECOND STAGE BASILIC VEIN TRANSPOSITION;  Surgeon: Angelia Mould, MD;  Location: [place];  Service: Vascular;  Laterality: Right;  . BIOPSY  05/17/2020   Procedure: BIOPSY;  Surgeon: Rush Landmark Telford Nab., MD;  Location: [facility] [place];  Service: Gastroenterology;;  . CARDIAC CATHETERIZATION    . CHOLECYSTECTOMY N/A 05/18/2020   Procedure: LAPAROSCOPIC CHOLECYSTECTOMY WITH INTRAOPERATIVE CHOLANGIOGRAM;  Surgeon: Kieth Brightly, Arta Bruce, MD;  Location: [place];  Service: General;  Laterality: N/A;  . COLONOSCOPY N/A 09/23/2015   Procedure: COLONOSCOPY;  Surgeon: Irene Shipper, MD;  Location: [facility];  Service: Endoscopy;  Laterality: N/A;  . Coloscopy    . ENDOSCOPIC RETROGRADE CHOLANGIOPANCREATOGRAPHY (ERCP) WITH PROPOFOL N/A 05/17/2020   Procedure: ENDOSCOPIC RETROGRADE CHOLANGIOPANCREATOGRAPHY (ERCP) WITH PROPOFOL;  Surgeon: Rush Landmark Telford Nab., MD;  Location: [place];   Service: Gastroenterology;  Laterality: N/A;  . ESOPHAGOGASTRODUODENOSCOPY (EGD) WITH PROPOFOL N/A 05/22/2020   Procedure: ESOPHAGOGASTRODUODENOSCOPY (EGD) WITH PROPOFOL;  Surgeon: Gatha Mayer, MD;  Location: [place];  Service: Endoscopy;  Laterality: N/A;  . EYE SURGERY Bilateral   . GAS INSERTION  05/10/2012   Procedure: INSERTION OF GAS;  Surgeon: Hayden Pedro, MD;  Location: [facility] [place];  Service: Ophthalmology;  Laterality: Right;  . LEFT HEART CATH AND CORONARY ANGIOGRAPHY N/A 09/11/2016   Procedure: Left Heart Cath and Coronary Angiography;  Surgeon: Belva Crome, MD;  Location: [facility];  Service: Cardiovascular;  Laterality: N/A;  . LESION EXCISION Right 05/08/2015   Procedure: EXCISION SCALP LESION;  Surgeon: Erroll Luna, MD;  Location: [place];  Service: General;  Laterality: Right;  . MEMBRANE PEEL  05/10/2012   Procedure: MEMBRANE PEEL;  Surgeon: Hayden Pedro, MD;  Location: [place];  Service: Ophthalmology;  Laterality: Right;  . PARS PLANA VITRECTOMY  08/27/2011   Procedure: PARS PLANA VITRECTOMY WITH 25 GAUGE;  Surgeon: Hayden Pedro, MD;  Location: [place];  Service: Ophthalmology;  Laterality: Left;  Repair of complex traction retinal detachment left eye  . PARS PLANA VITRECTOMY  05/10/2012   Procedure: PARS PLANA VITRECTOMY WITH 25 GAUGE;  Surgeon: Hayden Pedro, MD;  Location: [facility];  Service: Ophthalmology;  Laterality: Right;  Repair Complex Traction Retinal Detachment  . PHOTOCOAGULATION WITH LASER  05/10/2012   Procedure: PHOTOCOAGULATION WITH LASER;  Surgeon: Hayden Pedro, MD;  Location: [facility];  Service: Ophthalmology;  Laterality: Right;  . PORT-A-CATH REMOVAL    . PORTACATH PLACEMENT    . REMOVAL OF STONES  05/17/2020   Procedure: REMOVAL OF STONES;  Surgeon: Rush Landmark Telford Nab., MD;  Location: [facility];  Service: Gastroenterology;;  . REVISON OF ARTERIOVENOUS FISTULA Left 07/31/2020   Procedure: REVISION AND EXCISION OF OLD LEFT  RADIOCEPHALIC ARTERIOVENOUS FISTULA ANEURYSM;  Surgeon: Rosetta Posner, MD;  Location: [place];  Service: Vascular;  Laterality: Left;  . SPHINCTEROTOMY  05/17/2020   Procedure: SPHINCTEROTOMY;  Surgeon: Mansouraty, Telford Nab., MD;  Location: [facility];  Service: Gastroenterology;;  . SUBXYPHOID PERICARDIAL WINDOW N/A 01/10/2018   Procedure: SUBXYPHOID PERICARDIAL WINDOW;  Surgeon: Rexene Alberts, MD;  Location: [place];  Service: Thoracic;  Laterality: N/A;  . TRANSMETATARSAL AMPUTATION Right 03/13/2016   Procedure: TRANSMETATARSAL AMPUTATION;  Surgeon: Edrick Kins, DPM;  Location: [place];  Service: Podiatry;  Laterality: Right;  . VENA CAVA FILTER PLACEMENT  09/2012   due to preparation for surgery    MEDICATIONS: No current facility-administered medications for this encounter.   Marland Kitchen acetaminophen (TYLENOL) 500 MG tablet  . B Complex-C-Folic Acid (RENAL MULTIVITAMIN FORMULA) TABS  . Cholecalciferol (VITAMIN D3) 125 MCG (5000 UT) TABS  . enoxaparin (LOVENOX) 120 MG/0.8ML injection  . ferrous sulfate 325 (65 FE) MG tablet  . insulin aspart (NOVOLOG) 100 UNIT/ML injection  . lidocaine-prilocaine (EMLA) cream  . Multiple Vitamins-Minerals (MULTIVITAMIN WITH MINERALS) tablet  . multivitamin (RENA-VIT) TABS tablet  . pantoprazole (PROTONIX) 40  MG tablet  . sevelamer carbonate (RENVELA) 800 MG tablet  . vitamin C (ASCORBIC ACID) 500 MG tablet  . warfarin (COUMADIN) 5 MG tablet  . zinc sulfate 220 (50 Zn) MG capsule  . doxercalciferol (HECTOROL) 4 MCG/2ML injection  . iron sucrose in sodium chloride 0.9 % 100 mL  . Methoxy PEG-Epoetin Beta (MIRCERA IJ)  . oxyCODONE-acetaminophen (PERCOCET) 5-325 MG tablet    Myra Gianotti, PA-C Surgical Short Stay/Anesthesiology [facility] Phone [phone] [facility] Phone [phone] 11/05/2020 12:15 PM

## 2020-11-06 ENCOUNTER — Encounter (HOSPITAL_COMMUNITY)
Admission: RE | Disposition: A | Discharge status: Home / Self Care | Payer: Self-pay | Source: Home / Self Care | Attending: Vascular Surgery

## 2020-11-06 ENCOUNTER — Ambulatory Visit (HOSPITAL_COMMUNITY): Payer: Medicare Other | Admitting: Vascular Surgery

## 2020-11-06 ENCOUNTER — Other Ambulatory Visit: Payer: Self-pay

## 2020-11-06 ENCOUNTER — Ambulatory Visit (HOSPITAL_COMMUNITY)
Admission: RE | Admit: 2020-11-06 | Discharge: 2020-11-06 | Disposition: A | Discharge status: Home / Self Care | Payer: Medicare Other | Attending: Vascular Surgery | Admitting: Vascular Surgery

## 2020-11-06 ENCOUNTER — Encounter (HOSPITAL_COMMUNITY): Payer: Self-pay | Admitting: Vascular Surgery

## 2020-11-06 DIAGNOSIS — Z86718 Personal history of other venous thrombosis and embolism: Secondary | ICD-10-CM | POA: Insufficient documentation

## 2020-11-06 DIAGNOSIS — Y838 Other surgical procedures as the cause of abnormal reaction of the patient, or of later complication, without mention of misadventure at the time of the procedure: Secondary | ICD-10-CM | POA: Insufficient documentation

## 2020-11-06 DIAGNOSIS — I252 Old myocardial infarction: Secondary | ICD-10-CM | POA: Insufficient documentation

## 2020-11-06 DIAGNOSIS — D509 Iron deficiency anemia, unspecified: Secondary | ICD-10-CM | POA: Diagnosis not present

## 2020-11-06 DIAGNOSIS — Z794 Long term (current) use of insulin: Secondary | ICD-10-CM | POA: Diagnosis not present

## 2020-11-06 DIAGNOSIS — K219 Gastro-esophageal reflux disease without esophagitis: Secondary | ICD-10-CM | POA: Diagnosis not present

## 2020-11-06 DIAGNOSIS — L7632 Postprocedural hematoma of skin and subcutaneous tissue following other procedure: Secondary | ICD-10-CM | POA: Insufficient documentation

## 2020-11-06 DIAGNOSIS — I4891 Unspecified atrial fibrillation: Secondary | ICD-10-CM | POA: Diagnosis not present

## 2020-11-06 DIAGNOSIS — Z9889 Other specified postprocedural states: Secondary | ICD-10-CM

## 2020-11-06 DIAGNOSIS — T82898A Other specified complication of vascular prosthetic devices, implants and grafts, initial encounter: Secondary | ICD-10-CM | POA: Diagnosis not present

## 2020-11-06 DIAGNOSIS — Z7901 Long term (current) use of anticoagulants: Secondary | ICD-10-CM | POA: Diagnosis not present

## 2020-11-06 DIAGNOSIS — E1122 Type 2 diabetes mellitus with diabetic chronic kidney disease: Secondary | ICD-10-CM | POA: Insufficient documentation

## 2020-11-06 DIAGNOSIS — Z992 Dependence on renal dialysis: Secondary | ICD-10-CM | POA: Diagnosis not present

## 2020-11-06 DIAGNOSIS — N186 End stage renal disease: Secondary | ICD-10-CM | POA: Diagnosis not present

## 2020-11-06 DIAGNOSIS — I4892 Unspecified atrial flutter: Secondary | ICD-10-CM | POA: Insufficient documentation

## 2020-11-06 DIAGNOSIS — I120 Hypertensive chronic kidney disease with stage 5 chronic kidney disease or end stage renal disease: Secondary | ICD-10-CM | POA: Diagnosis not present

## 2020-11-06 DIAGNOSIS — Z79899 Other long term (current) drug therapy: Secondary | ICD-10-CM | POA: Diagnosis not present

## 2020-11-06 DIAGNOSIS — I1311 Hypertensive heart and chronic kidney disease without heart failure, with stage 5 chronic kidney disease, or end stage renal disease: Secondary | ICD-10-CM | POA: Diagnosis not present

## 2020-11-06 HISTORY — DX: Chronic obstructive pulmonary disease, unspecified: J44.9

## 2020-11-06 HISTORY — PX: REVISON OF ARTERIOVENOUS FISTULA: SHX6074

## 2020-11-06 LAB — GLUCOSE, CAPILLARY
Glucose-Capillary: 96 mg/dL (ref 70–99)
Glucose-Capillary: 100 mg/dL — ABNORMAL HIGH (ref 70–99)

## 2020-11-06 LAB — POCT I-STAT, CHEM 8
BUN: 29 mg/dL — ABNORMAL HIGH (ref 8–23)
Calcium, Ion: 1.08 mmol/L — ABNORMAL LOW (ref 1.15–1.40)
Chloride: 94 mmol/L — ABNORMAL LOW (ref 98–111)
Creatinine, Ser: 6.4 mg/dL — ABNORMAL HIGH (ref 0.61–1.24)
Glucose, Bld: 87 mg/dL (ref 70–99)
HCT: 32 % — ABNORMAL LOW (ref 39.0–52.0)
Hemoglobin: 10.9 g/dL — ABNORMAL LOW (ref 13.0–17.0)
Potassium: 4 mmol/L (ref 3.5–5.1)
Sodium: 137 mmol/L (ref 135–145)
TCO2: 34 mmol/L — ABNORMAL HIGH (ref 22–32)

## 2020-11-06 LAB — PROTIME-INR
INR: 1.2 (ref 0.8–1.2)
Prothrombin Time: 15.5 seconds — ABNORMAL HIGH (ref 11.4–15.2)

## 2020-11-06 SURGERY — REVISON OF ARTERIOVENOUS FISTULA
Anesthesia: General | Site: Arm Lower | Laterality: Left

## 2020-11-06 MED ORDER — LIDOCAINE-EPINEPHRINE (PF) 1 %-1:200000 IJ SOLN
INTRAMUSCULAR | Status: AC
  Filled 2020-11-06: qty 30

## 2020-11-06 MED ORDER — EPHEDRINE SULFATE-NACL 50-0.9 MG/10ML-% IV SOSY
PREFILLED_SYRINGE | INTRAVENOUS | Status: DC | PRN
  Administered 2020-11-06: 10 mg via INTRAVENOUS

## 2020-11-06 MED ORDER — CEFAZOLIN SODIUM-DEXTROSE 2-4 GM/100ML-% IV SOLN
2.0000 g | INTRAVENOUS | Status: AC
  Administered 2020-11-06: 2 g via INTRAVENOUS

## 2020-11-06 MED ORDER — 0.9 % SODIUM CHLORIDE (POUR BTL) OPTIME
TOPICAL | Status: DC | PRN
  Administered 2020-11-06: 1000 mL

## 2020-11-06 MED ORDER — LIDOCAINE HCL (PF) 1 % IJ SOLN
INTRAMUSCULAR | Status: AC
  Filled 2020-11-06: qty 30

## 2020-11-06 MED ORDER — OXYCODONE HCL 5 MG/5ML PO SOLN: 5.0000 mg | Freq: Once | ORAL | Status: DC | PRN

## 2020-11-06 MED ORDER — SODIUM CHLORIDE 0.9 % IV SOLN: INTRAVENOUS | Status: DC

## 2020-11-06 MED ORDER — DEXAMETHASONE SODIUM PHOSPHATE 10 MG/ML IJ SOLN
INTRAMUSCULAR | Status: DC | PRN
  Administered 2020-11-06: 5 mg via INTRAVENOUS

## 2020-11-06 MED ORDER — LIDOCAINE 2% (20 MG/ML) 5 ML SYRINGE
INTRAMUSCULAR | Status: DC | PRN
  Administered 2020-11-06: 40 mg via INTRAVENOUS

## 2020-11-06 MED ORDER — CEFAZOLIN SODIUM-DEXTROSE 2-4 GM/100ML-% IV SOLN
INTRAVENOUS | Status: AC
  Filled 2020-11-06: qty 100

## 2020-11-06 MED ORDER — ACETAMINOPHEN 160 MG/5ML PO SOLN: 325.0000 mg | ORAL | Status: DC | PRN

## 2020-11-06 MED ORDER — CHLORHEXIDINE GLUCONATE 0.12 % MT SOLN: 15.0000 mL | Freq: Once | OROMUCOSAL | Status: AC

## 2020-11-06 MED ORDER — PROPOFOL 10 MG/ML IV BOLUS
INTRAVENOUS | Status: DC | PRN
  Administered 2020-11-06: 50 mg via INTRAVENOUS
  Administered 2020-11-06: 100 mg via INTRAVENOUS

## 2020-11-06 MED ORDER — CHLORHEXIDINE GLUCONATE 4 % EX LIQD: 60.0000 mL | Freq: Once | CUTANEOUS | Status: DC

## 2020-11-06 MED ORDER — ACETAMINOPHEN 10 MG/ML IV SOLN: 1000.0000 mg | Freq: Once | INTRAVENOUS | Status: DC | PRN

## 2020-11-06 MED ORDER — ONDANSETRON HCL 4 MG/2ML IJ SOLN
INTRAMUSCULAR | Status: DC | PRN
  Administered 2020-11-06: 4 mg via INTRAVENOUS

## 2020-11-06 MED ORDER — MIDAZOLAM HCL 5 MG/5ML IJ SOLN
INTRAMUSCULAR | Status: DC | PRN
  Administered 2020-11-06: 2 mg via INTRAVENOUS

## 2020-11-06 MED ORDER — PROMETHAZINE HCL 25 MG/ML IJ SOLN: 6.2500 mg | INTRAMUSCULAR | Status: DC | PRN

## 2020-11-06 MED ORDER — ACETAMINOPHEN 325 MG PO TABS
325.0000 mg | ORAL_TABLET | ORAL | Status: DC | PRN
Start: 2020-11-06 — End: 2020-11-06

## 2020-11-06 MED ORDER — FENTANYL CITRATE (PF) 250 MCG/5ML IJ SOLN
INTRAMUSCULAR | Status: AC
  Filled 2020-11-06: qty 5

## 2020-11-06 MED ORDER — OXYCODONE HCL 5 MG PO TABS: 5.0000 mg | ORAL_TABLET | Freq: Once | ORAL | Status: DC | PRN

## 2020-11-06 MED ORDER — FENTANYL CITRATE (PF) 100 MCG/2ML IJ SOLN
INTRAMUSCULAR | Status: DC | PRN
  Administered 2020-11-06 (×2): 25 ug via INTRAVENOUS

## 2020-11-06 MED ORDER — ORAL CARE MOUTH RINSE: 15.0000 mL | Freq: Once | OROMUCOSAL | Status: AC

## 2020-11-06 MED ORDER — CHLORHEXIDINE GLUCONATE 0.12 % MT SOLN
OROMUCOSAL | Status: AC
  Administered 2020-11-06: 15 mL via OROMUCOSAL
  Filled 2020-11-06: qty 15

## 2020-11-06 MED ORDER — MIDAZOLAM HCL 2 MG/2ML IJ SOLN
INTRAMUSCULAR | Status: AC
  Filled 2020-11-06: qty 2

## 2020-11-06 MED ORDER — FENTANYL CITRATE (PF) 100 MCG/2ML IJ SOLN: 25.0000 ug | INTRAMUSCULAR | Status: DC | PRN

## 2020-11-06 MED ORDER — OXYCODONE-ACETAMINOPHEN 5-325 MG PO TABS: 1.0000 | ORAL_TABLET | Freq: Four times a day (QID) | ORAL | 0 refills | Status: DC | PRN

## 2020-11-06 MED ORDER — LIDOCAINE HCL 1 % IJ SOLN
INTRAMUSCULAR | Status: DC | PRN
  Administered 2020-11-06: 2 mL

## 2020-11-06 SURGICAL SUPPLY — 36 items
ADH SKN CLS APL DERMABOND .7 (GAUZE/BANDAGES/DRESSINGS) ×1
ARMBAND PINK RESTRICT EXTREMIT (MISCELLANEOUS) ×2 IMPLANT
BNDG ELASTIC 4X5.8 VLCR STR LF (GAUZE/BANDAGES/DRESSINGS) ×1 IMPLANT
BNDG GAUZE ELAST 4 BULKY (GAUZE/BANDAGES/DRESSINGS) ×1 IMPLANT
CANISTER SUCT 3000ML PPV (MISCELLANEOUS) ×2 IMPLANT
CLIP VESOCCLUDE MED 6/CT (CLIP) ×2 IMPLANT
CLIP VESOCCLUDE SM WIDE 6/CT (CLIP) ×2 IMPLANT
DECANTER SPIKE VIAL GLASS SM (MISCELLANEOUS) ×2 IMPLANT
DERMABOND ADVANCED (GAUZE/BANDAGES/DRESSINGS) ×1
DERMABOND ADVANCED .7 DNX12 (GAUZE/BANDAGES/DRESSINGS) ×1 IMPLANT
DRAPE EXTREMITY T 121X128X90 (DISPOSABLE) ×1 IMPLANT
ELECT REM PT RETURN 9FT ADLT (ELECTROSURGICAL) ×2
ELECTRODE REM PT RTRN 9FT ADLT (ELECTROSURGICAL) ×1 IMPLANT
GAUZE SPONGE 4X4 12PLY STRL (GAUZE/BANDAGES/DRESSINGS) ×1 IMPLANT
GLOVE BIO SURGEON STRL SZ7.5 (GLOVE) ×2 IMPLANT
GLOVE SRG 8 PF TXTR STRL LF DI (GLOVE) ×1 IMPLANT
GLOVE SURG UNDER POLY LF SZ8 (GLOVE) ×2
GOWN STRL REUS W/ TWL LRG LVL3 (GOWN DISPOSABLE) ×2 IMPLANT
GOWN STRL REUS W/ TWL XL LVL3 (GOWN DISPOSABLE) ×2 IMPLANT
GOWN STRL REUS W/TWL LRG LVL3 (GOWN DISPOSABLE) ×4
GOWN STRL REUS W/TWL XL LVL3 (GOWN DISPOSABLE) ×4
KIT BASIN OR (CUSTOM PROCEDURE TRAY) ×2 IMPLANT
KIT TURNOVER KIT B (KITS) ×2 IMPLANT
NS IRRIG 1000ML POUR BTL (IV SOLUTION) ×2 IMPLANT
PACK CV ACCESS (CUSTOM PROCEDURE TRAY) ×2 IMPLANT
PAD ARMBOARD 7.5X6 YLW CONV (MISCELLANEOUS) ×4 IMPLANT
SUT ETHILON 3 0 PS 1 (SUTURE) ×1 IMPLANT
SUT MNCRL AB 4-0 PS2 18 (SUTURE) ×2 IMPLANT
SUT PROLENE 5 0 C 1 24 (SUTURE) IMPLANT
SUT PROLENE 6 0 BV (SUTURE) ×2 IMPLANT
SUT PROLENE 7 0 BV 1 (SUTURE) IMPLANT
SUT VIC AB 3-0 SH 27 (SUTURE) ×2
SUT VIC AB 3-0 SH 27X BRD (SUTURE) ×1 IMPLANT
TOWEL GREEN STERILE (TOWEL DISPOSABLE) ×2 IMPLANT
UNDERPAD 30X36 HEAVY ABSORB (UNDERPADS AND DIAPERS) ×2 IMPLANT
WATER STERILE IRR 1000ML POUR (IV SOLUTION) ×2 IMPLANT

## 2020-11-06 NOTE — Transfer of Care (Signed)
Immediate Anesthesia Transfer of Care Note  Patient: [name]  Procedure(s) Performed: EXCISION AND CLEANOUT OF LEFT ARM (Left Arm Lower)  Patient Location: PACU  Anesthesia Type:General  Level of Consciousness: awake, alert  and oriented  Airway & Oxygen Therapy: Patient Spontanous Breathing  Post-op Assessment: Report given to RN and Post -op Vital signs reviewed and stable  Post vital signs: Reviewed and stable  Last Vitals:  Vitals Value Taken Time  BP 92/54 11/06/20 1146  Temp 36.2 C 11/06/20 1145  Pulse 71 11/06/20 1157  Resp 22 11/06/20 1157  SpO2 99 % 11/06/20 1157  Vitals shown include unvalidated device data.  Last Pain:  Vitals:   11/06/20 1145  TempSrc:   PainSc: Asleep         Complications: No complications documented.

## 2020-11-06 NOTE — Progress Notes (Signed)
Confirmed with Dr. Donnetta Hutching okay to place PIV in Left arm

## 2020-11-06 NOTE — H&P (Signed)
Office Visit  09/30/2020 [facility]   Mylo Choi, Arvilla Meres, MD   Vascular Surgery  ESRD (end stage renal disease) ([place])   Dx  Follow-up; Referred by Burnard Bunting, MD   Reason for Visit    Additional Documentation  Vitals:  BP 153/70Important    Pulse 88   Temp 98.7 F (37.1 C) (Other (Comment))   Resp 18   Ht 6' 3.5" (1.918 m)   Wt 114.3 kg   SpO2 92%   BMI 31.08 kg/m   BSA 2.47 m   Flowsheets:  Vital Signs,   NEWS,   MEWS Score,   Anthropometrics,   Infectious Disease Screening,   Method of Visit     Encounter Info:  Billing Info,   History,   Allergies,   Detailed Report      All Notes   Progress Notes by Rosetta Posner, MD at 09/30/2020 2:00 PM  Author: Rosetta Posner, MD Author Type: Physician Filed: 09/30/2020 3:45 PM  Note Status: Signed Cosign: Cosign Not Required Encounter Date: 09/30/2020  Editor: Rosetta Posner, MD (Physician)                                                 [facility] [place] [facility]  Patient name: [name]          MRN: [id number]        DOB: Jul 03, 1950        Sex: male  REASON FOR VISIT: Follow-up left wrist venous aneurysm excision  HPI: [name] is a 70 y.o. male here today for follow-up.  Had presented with painful thrombosed aneurysms and an old nonfunctional left forearm AV fistula.  He underwent uneventful excision of the 2 levels of aneurysmal change on 07/31/2020.  On my initial postop visit the patient was still having some evidence of swelling at the distalmost incision in the upper was healing nicely.  He is seen today for further follow-up.        Current Outpatient Medications  Medication Sig Dispense Refill  . acetaminophen (TYLENOL) 500 MG tablet Take 500-1,000 mg by mouth daily as needed (back pain).    . Cholecalciferol (VITAMIN D3) 125 MCG (5000 UT) TABS Take 5,000 Units by mouth daily.    . cinacalcet (SENSIPAR) 30 MG tablet Take 60  mg by mouth See admin instructions. Take one tablet (30 mg) by mouth twice daily - with lunch and supper    . doxercalciferol (HECTOROL) 4 MCG/2ML injection Inject 2.5 mLs (5 mcg total) into the vein Every Tuesday,Thursday,and Saturday with dialysis. 2 mL   . insulin aspart (NOVOLOG) 100 UNIT/ML injection Inject 1-9 Units into the skin 3 (three) times daily before meals. (Patient taking differently: Inject 10 Units into the skin 3 (three) times daily before meals.)    . iron sucrose in sodium chloride 0.9 % 100 mL Iron Sucrose (Venofer)    . lidocaine-prilocaine (EMLA) cream Apply 1 application topically as needed (Apply small amount to access site 1-2 hours before dialysis. Cover with occlusive dressing (saran wrap)).     . Methoxy PEG-Epoetin Beta (MIRCERA IJ) Mircera    . multivitamin (RENA-VIT) TABS tablet Take 1 tablet by mouth daily.    . pantoprazole (PROTONIX) 40 MG tablet Take 1 tablet (40 mg total) by mouth 2 (two) times daily. [place]  tablet 1  . polysaccharide iron (NIFEREX) 150 MG CAPS capsule Take 1 capsule (150 mg total) by mouth daily. 30 each 2  . sevelamer carbonate (RENVELA) 800 MG tablet Take 800 mg by mouth 3 (three) times daily with meals.    . vitamin C (ASCORBIC ACID) 500 MG tablet Take 500 mg by mouth daily.    Marland Kitchen zinc sulfate 220 (50 Zn) MG capsule Take 220 mg by mouth daily.    Marland Kitchen oxyCODONE-acetaminophen (PERCOCET) 5-325 MG tablet Take 1 tablet by mouth every 6 (six) hours as needed for severe pain. (Patient not taking: No sig reported) 15 tablet 0  . warfarin (COUMADIN) 1 MG tablet Take 2 tablets (2 mg total) by mouth daily for 14 days. Start coumadin 2mg , increase or decrease dose based on INR and instruction  Given by MD 28 tablet 0   No current facility-administered medications for this visit.     PHYSICAL EXAM:    Vitals:   09/30/20 1346  BP: (!) 153/70  Pulse: 88  Resp: 18  Temp: 98.7 F (37.1 C)  TempSrc: Other (Comment)  SpO2: 92%   Weight: 252 lb (114.3 kg)  Height: 6' 3.5" (1.918 m)    GENERAL: The patient is a well-nourished male, in no acute distress. The vital signs are documented above. The upper mid forearm area of excision is completely healed with no swelling.  The lower area closer to the wrist has a very large recurrent area of fullness with some tenting of the skin.  There is no evidence of skin breakdown.  MEDICAL ISSUES: Had long discussion with the patient and his family.  This is very unusual.  The thrombosed vein was completely excised.  He appears to have filled this area with either hematoma or lymphocele.  This is uncomfortable and is certainly larger than the last time I saw him in our office.  I do not feel that this will resolve with observation only.  I have recommended return to the operating room for excision and cleanout.  He currently has hemodialysis on Tuesdays Thursdays and Saturdays so therefore we will schedule this as an outpatient at [place], MD [facility] Tel [phone]  Note: Portions of this report may have been transcribed using voice recognition software.  Every effort has been made to ensure accuracy; however, inadvertent computerized transcription errors may still be present.        Addendum:  The patient has been re-examined and re-evaluated.  The patient's history and physical has been reviewed and is unchanged.    [name] is a 70 y.o. male is being admitted with ESRD. All the risks, benefits and other treatment options have been discussed with the patient. The patient has consented to proceed with Procedure(s): REVISON OF LEFT ARM ARTERIOVENOUS FISTULA as a surgical intervention.  Zaylin Pistilli 11/06/2020 9:59 AM Vascular and Vein Surgery

## 2020-11-06 NOTE — Op Note (Signed)
    OPERATIVE REPORT  DATE OF SURGERY: 11/06/2020  PATIENT: [name], 70 y.o. male MRN: [id number]  DOB: 21-Jan-1951  PRE-OPERATIVE DIAGNOSIS: Hematoma left forearm from prior excision of venous aneurysm  POST-OPERATIVE DIAGNOSIS:  Same  PROCEDURE: Evacuation hematoma left forearm  SURGEON:  Curt Jews, [name]  PHYSICIAN ASSISTANT: Rhyne  The assistant was needed for exposure and to expedite the case  ANESTHESIA: LMA  EBL: per anesthesia record  Total I/O In: 100 [IV Piggyback:100] Out: 25 [Blood:25]  BLOOD ADMINISTERED: none  DRAINS: none  SPECIMEN: none  COUNTS CORRECT:  YES  PATIENT DISPOSITION:  PACU - hemodynamically stable  PROCEDURE DETAILS: Patient status post excision of venous aneurysm of the nonfunctional left forearm AV fistula.  He had had hematoma in the lower portion of this and this was failed to resolve despite watchful waiting.  This is uncomfortable to him and is causing some tenting of the skin.  He is taken to the operating this time for evacuation  Incision was made over the area of hematoma.  There was some serous fluid and some organized hematoma present.  This was encapsulated.  This was a completely removed.  There was no evidence of active bleeding.  The area was irrigated with saline.  The wound was closed with 3-0 nylon mattress sutures to obliterate the dead space.  Sterile dressing and Ace wrap were applied.  The patient was transferred to the recovery room in stable condition   Rosetta Posner, [name], [facility] 11/06/2020 11:39 AM  Note: Portions of this report may have been transcribed using voice recognition software.  Every effort has been made to ensure accuracy; however, inadvertent computerized transcription errors may still be present.

## 2020-11-06 NOTE — Anesthesia Postprocedure Evaluation (Signed)
Anesthesia Post Note  Patient: [name]  Procedure(s) Performed: EXCISION AND CLEANOUT OF LEFT ARM (Left Arm Lower)     Patient location during evaluation: PACU Anesthesia Type: General Level of consciousness: awake and alert Pain management: pain level controlled Vital Signs Assessment: post-procedure vital signs reviewed and stable Respiratory status: spontaneous breathing, nonlabored ventilation, respiratory function stable and patient connected to nasal cannula oxygen Cardiovascular status: blood pressure returned to baseline and stable Postop Assessment: no apparent nausea or vomiting Anesthetic complications: no   No complications documented.  Last Vitals:  Vitals:   11/06/20 1215 11/06/20 1230  BP: 125/62 (!) 131/52  Pulse: 75 75  Resp: (!) 24 (!) 24  Temp: (!) 36.3 C   SpO2: 95% 93%    Last Pain:  Vitals:   11/06/20 1230  TempSrc:   PainSc: 0-No pain                 [name]

## 2020-11-06 NOTE — Discharge Instructions (Signed)
   [facility]  Discharge Instructions   Please refer to the following instructions for your post-procedure care. Your surgeon or physician assistant will discuss any changes with you.  Activity  You may drive the day following your surgery, if you are comfortable and no longer taking prescription pain medication. Resume full activity as the soreness in your incision resolves.  Bathing/Showering  You may shower after you go home. Keep your incision dry for 48 hours. Do not soak in a bathtub, hot tub, or swim until the incision heals completely. You may not shower if you have a hemodialysis catheter.  Incision Care  Clean your incision with mild soap and water after 48 hours. Pat the area dry with a clean towel. You do not need a bandage unless otherwise instructed. Do not apply any ointments or creams to your incision. You may have skin glue on your incision. Do not peel it off. It will come off on its own in about one week. Your arm may swell a bit after surgery. To reduce swelling use pillows to elevate your arm so it is above your heart. Your doctor will tell you if you need to lightly wrap your arm with an ACE bandage.  Diet  Resume your normal diet. There are not special food restrictions following this procedure. In order to heal from your surgery, it is CRITICAL to get adequate nutrition. Your body requires vitamins, minerals, and protein. Vegetables are the best source of vitamins and minerals. Vegetables also provide the perfect balance of protein. Processed food has little nutritional value, so try to avoid this.  Medications  Resume taking all of your medications. If your incision is causing pain, you may take over-the counter pain relievers such as acetaminophen (Tylenol). If you were prescribed a stronger pain medication, please be aware these medications can cause nausea and constipation. Prevent nausea by taking the medication with a snack or  meal. Avoid constipation by drinking plenty of fluids and eating foods with high amount of fiber, such as fruits, vegetables, and grains.  Do not take Tylenol if you are taking prescription pain medications.  Restart your Lovenox/Coumadin bridge on 11/07/2020.  Follow up with your doctor who follows your coumadin for INR check in the next week.  Follow up Your surgeon may want to see you in the office following your access surgery. If so, this will be arranged at the time of your surgery.  Please call us immediately for any of the following conditions:  . Increased pain, redness, drainage (pus) from your incision site . Fever of 101 degrees or higher . Severe or worsening pain at your incision site . Hand pain or numbness. .  Reduce your risk of vascular disease:  . Stop smoking. If you would like help, call [facility] at 1-800-QUIT-NOW (540) 292-4792) or [place] at 463-395-5044  . Manage your cholesterol . Maintain a desired weight . Control your diabetes . Keep your blood pressure down     11/06/2020 GEOFFRY BANNISTER [id number] 03-11-1951  Surgeon(s): Early, Arvilla Meres, MD  Procedure(s): EXCISION AND CLEANOUT OF LEFT ARM   If you have any questions, please call the office at [phone].

## 2020-11-06 NOTE — Anesthesia Procedure Notes (Signed)
Procedure Name: Intubation Date/Time: 11/06/2020 11:14 AM Performed by: Griffin Dakin, CRNA Pre-anesthesia Checklist: Patient identified, Emergency Drugs available, Suction available and Patient being monitored Patient Re-evaluated:Patient Re-evaluated prior to induction Oxygen Delivery Method: Circle system utilized Preoxygenation: Pre-oxygenation with 100% oxygen Induction Type: IV induction Ventilation: Mask ventilation without difficulty LMA: LMA inserted LMA Size: 5.0 Number of attempts: 1 Placement Confirmation: positive ETCO2 and breath sounds checked- equal and bilateral Tube secured with: Tape Dental Injury: Teeth and Oropharynx as per pre-operative assessment

## 2020-11-07 ENCOUNTER — Encounter (HOSPITAL_COMMUNITY): Payer: Self-pay | Admitting: Vascular Surgery

## 2020-11-07 DIAGNOSIS — E1129 Type 2 diabetes mellitus with other diabetic kidney complication: Secondary | ICD-10-CM | POA: Diagnosis not present

## 2020-11-07 DIAGNOSIS — D631 Anemia in chronic kidney disease: Secondary | ICD-10-CM | POA: Diagnosis not present

## 2020-11-07 DIAGNOSIS — N186 End stage renal disease: Secondary | ICD-10-CM | POA: Diagnosis not present

## 2020-11-07 DIAGNOSIS — Z992 Dependence on renal dialysis: Secondary | ICD-10-CM | POA: Diagnosis not present

## 2020-11-07 DIAGNOSIS — N2581 Secondary hyperparathyroidism of renal origin: Secondary | ICD-10-CM | POA: Diagnosis not present

## 2020-11-09 DIAGNOSIS — E1129 Type 2 diabetes mellitus with other diabetic kidney complication: Secondary | ICD-10-CM | POA: Diagnosis not present

## 2020-11-09 DIAGNOSIS — D631 Anemia in chronic kidney disease: Secondary | ICD-10-CM | POA: Diagnosis not present

## 2020-11-09 DIAGNOSIS — Z992 Dependence on renal dialysis: Secondary | ICD-10-CM | POA: Diagnosis not present

## 2020-11-09 DIAGNOSIS — N186 End stage renal disease: Secondary | ICD-10-CM | POA: Diagnosis not present

## 2020-11-09 DIAGNOSIS — N2581 Secondary hyperparathyroidism of renal origin: Secondary | ICD-10-CM | POA: Diagnosis not present

## 2020-11-12 DIAGNOSIS — D631 Anemia in chronic kidney disease: Secondary | ICD-10-CM | POA: Diagnosis not present

## 2020-11-12 DIAGNOSIS — E1129 Type 2 diabetes mellitus with other diabetic kidney complication: Secondary | ICD-10-CM | POA: Diagnosis not present

## 2020-11-12 DIAGNOSIS — N186 End stage renal disease: Secondary | ICD-10-CM | POA: Diagnosis not present

## 2020-11-12 DIAGNOSIS — Z992 Dependence on renal dialysis: Secondary | ICD-10-CM | POA: Diagnosis not present

## 2020-11-12 DIAGNOSIS — N2581 Secondary hyperparathyroidism of renal origin: Secondary | ICD-10-CM | POA: Diagnosis not present

## 2020-11-14 DIAGNOSIS — N186 End stage renal disease: Secondary | ICD-10-CM | POA: Diagnosis not present

## 2020-11-14 DIAGNOSIS — Z992 Dependence on renal dialysis: Secondary | ICD-10-CM | POA: Diagnosis not present

## 2020-11-14 DIAGNOSIS — E1129 Type 2 diabetes mellitus with other diabetic kidney complication: Secondary | ICD-10-CM | POA: Diagnosis not present

## 2020-11-14 DIAGNOSIS — N2581 Secondary hyperparathyroidism of renal origin: Secondary | ICD-10-CM | POA: Diagnosis not present

## 2020-11-14 DIAGNOSIS — D631 Anemia in chronic kidney disease: Secondary | ICD-10-CM | POA: Diagnosis not present

## 2020-11-15 DIAGNOSIS — Z7901 Long term (current) use of anticoagulants: Secondary | ICD-10-CM | POA: Diagnosis not present

## 2020-11-15 DIAGNOSIS — N186 End stage renal disease: Secondary | ICD-10-CM | POA: Diagnosis not present

## 2020-11-15 DIAGNOSIS — S41102A Unspecified open wound of left upper arm, initial encounter: Secondary | ICD-10-CM | POA: Diagnosis not present

## 2020-11-15 DIAGNOSIS — I4891 Unspecified atrial fibrillation: Secondary | ICD-10-CM | POA: Diagnosis not present

## 2020-11-16 DIAGNOSIS — N2581 Secondary hyperparathyroidism of renal origin: Secondary | ICD-10-CM | POA: Diagnosis not present

## 2020-11-16 DIAGNOSIS — Z992 Dependence on renal dialysis: Secondary | ICD-10-CM | POA: Diagnosis not present

## 2020-11-16 DIAGNOSIS — N186 End stage renal disease: Secondary | ICD-10-CM | POA: Diagnosis not present

## 2020-11-16 DIAGNOSIS — D631 Anemia in chronic kidney disease: Secondary | ICD-10-CM | POA: Diagnosis not present

## 2020-11-16 DIAGNOSIS — E1129 Type 2 diabetes mellitus with other diabetic kidney complication: Secondary | ICD-10-CM | POA: Diagnosis not present

## 2020-11-19 DIAGNOSIS — D631 Anemia in chronic kidney disease: Secondary | ICD-10-CM | POA: Diagnosis not present

## 2020-11-19 DIAGNOSIS — Z992 Dependence on renal dialysis: Secondary | ICD-10-CM | POA: Diagnosis not present

## 2020-11-19 DIAGNOSIS — E1129 Type 2 diabetes mellitus with other diabetic kidney complication: Secondary | ICD-10-CM | POA: Diagnosis not present

## 2020-11-19 DIAGNOSIS — N186 End stage renal disease: Secondary | ICD-10-CM | POA: Diagnosis not present

## 2020-11-19 DIAGNOSIS — N2581 Secondary hyperparathyroidism of renal origin: Secondary | ICD-10-CM | POA: Diagnosis not present

## 2020-11-21 DIAGNOSIS — N2581 Secondary hyperparathyroidism of renal origin: Secondary | ICD-10-CM | POA: Diagnosis not present

## 2020-11-21 DIAGNOSIS — D631 Anemia in chronic kidney disease: Secondary | ICD-10-CM | POA: Diagnosis not present

## 2020-11-21 DIAGNOSIS — Z992 Dependence on renal dialysis: Secondary | ICD-10-CM | POA: Diagnosis not present

## 2020-11-21 DIAGNOSIS — E1129 Type 2 diabetes mellitus with other diabetic kidney complication: Secondary | ICD-10-CM | POA: Diagnosis not present

## 2020-11-21 DIAGNOSIS — N186 End stage renal disease: Secondary | ICD-10-CM | POA: Diagnosis not present

## 2020-11-23 DIAGNOSIS — Z992 Dependence on renal dialysis: Secondary | ICD-10-CM | POA: Diagnosis not present

## 2020-11-23 DIAGNOSIS — D631 Anemia in chronic kidney disease: Secondary | ICD-10-CM | POA: Diagnosis not present

## 2020-11-23 DIAGNOSIS — N186 End stage renal disease: Secondary | ICD-10-CM | POA: Diagnosis not present

## 2020-11-23 DIAGNOSIS — E1129 Type 2 diabetes mellitus with other diabetic kidney complication: Secondary | ICD-10-CM | POA: Diagnosis not present

## 2020-11-23 DIAGNOSIS — N2581 Secondary hyperparathyroidism of renal origin: Secondary | ICD-10-CM | POA: Diagnosis not present

## 2020-11-25 ENCOUNTER — Encounter: Payer: Medicare Other | Admitting: Vascular Surgery

## 2020-11-25 ENCOUNTER — Other Ambulatory Visit: Payer: Self-pay

## 2020-11-25 ENCOUNTER — Ambulatory Visit (INDEPENDENT_AMBULATORY_CARE_PROVIDER_SITE_OTHER): Payer: Medicare Other | Admitting: Vascular Surgery

## 2020-11-25 ENCOUNTER — Encounter: Payer: Self-pay | Admitting: Vascular Surgery

## 2020-11-25 VITALS — BP 148/106 | HR 92 | Temp 97.7°F | Resp 18 | Ht 75.0 in | Wt 245.0 lb

## 2020-11-25 DIAGNOSIS — N186 End stage renal disease: Secondary | ICD-10-CM

## 2020-11-25 NOTE — Progress Notes (Signed)
[facility] [place]  Patient name: [name] MRN: [id number] DOB: 11-22-1950 Sex: male  REASON FOR VISIT: Suture removal from the left arm AV fistula revision  HPI: [name] is a 70 y.o. male here today for suture removal.  He had presented with painful thrombosed left arm AV fistula.  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  The upper portion healed completely without difficulty.  The lower area that was excised developed a hematoma and he was having pain associated with this.  He underwent exploration and evacuation of hematoma on 11/06/2020 as an outpatient.  He is here today for wound check and suture removal.  The wound is completely healed.  He had 3 nylon mattress sutures to close the potential space and these were removed without difficulty.  Current Outpatient Medications  Medication Sig Dispense Refill   acetaminophen (TYLENOL) 500 MG tablet Take 500-1,000 mg by mouth daily as needed (back pain).     B Complex-C-Folic Acid (RENAL MULTIVITAMIN FORMULA) TABS Take 1 tablet by mouth daily. Renal Tab II     Cholecalciferol (VITAMIN D3) 125 MCG (5000 UT) TABS Take 5,000 Units by mouth daily.     doxercalciferol (HECTOROL) 4 MCG/2ML injection Inject 2.5 mLs (5 mcg total) into the vein Every Tuesday,Thursday,and Saturday with dialysis. 2 mL    enoxaparin (LOVENOX) 120 MG/0.8ML injection Inject 120 mg into the skin every 12 (twelve) hours.     ferrous sulfate 325 (65 FE) MG tablet Take 325 mg by mouth daily with breakfast.     insulin aspart (NOVOLOG) 100 UNIT/ML injection Inject 1-9 Units into the skin 3 (three) times daily before meals. (Patient taking differently: Inject 10 Units into the skin 2 (two) times daily with a meal.)     iron sucrose in sodium chloride 0.9 % 100 mL Iron Sucrose (Venofer)     lidocaine-prilocaine (EMLA) cream Apply 1  application topically as needed (Apply small amount to access site 1-2 hours before dialysis. Cover with occlusive dressing (saran wrap)).      Methoxy PEG-Epoetin Beta (MIRCERA IJ) Mircera     Multiple Vitamins-Minerals (MULTIVITAMIN WITH MINERALS) tablet Take 1 tablet by mouth daily.     multivitamin (RENA-VIT) TABS tablet Take 1 tablet by mouth daily.     pantoprazole (PROTONIX) 40 MG tablet Take 1 tablet (40 mg total) by mouth 2 (two) times daily. 60 tablet 1   sevelamer carbonate (RENVELA) 800 MG tablet Take 800 mg by mouth 3 (three) times daily with meals.     vitamin C (ASCORBIC ACID) 500 MG tablet Take 500 mg by mouth daily.     warfarin (COUMADIN) 5 MG tablet Take 7.5 mg by mouth daily.     zinc sulfate 220 (50 Zn) MG capsule Take 220 mg by mouth daily.     oxyCODONE-acetaminophen (PERCOCET) 5-325 MG tablet Take 1 tablet by mouth every 6 (six) hours as needed for severe pain. (Patient not taking: Reported on 11/25/2020) 8 tablet 0   No current facility-administered medications for this visit.     PHYSICAL EXAM: Vitals:   11/25/20 1443  BP: (!) 148/106  Pulse: 92  Resp: 18  Temp: 97.7 F (36.5 C)  TempSrc: Temporal  SpO2: 95%  Weight: 245 lb (111.1 kg)  Height: 6\' 3"  (1.905 m)    GENERAL: The patient is a well-nourished male, in no acute distress. The vital signs are  documented above. Well-healed area on his left forearm for evacuation of hematoma  MEDICAL ISSUES: Sutures removed today.  Will see [place] again on an as-needed basis   Rosetta Posner, MD [facility] Tel [phone]  Note: Portions of this report may have been transcribed using voice recognition software.  Every effort has been made to ensure accuracy; however, inadvertent computerized transcription errors may still be present.

## 2020-11-26 DIAGNOSIS — N2581 Secondary hyperparathyroidism of renal origin: Secondary | ICD-10-CM | POA: Diagnosis not present

## 2020-11-26 DIAGNOSIS — D631 Anemia in chronic kidney disease: Secondary | ICD-10-CM | POA: Diagnosis not present

## 2020-11-26 DIAGNOSIS — N186 End stage renal disease: Secondary | ICD-10-CM | POA: Diagnosis not present

## 2020-11-26 DIAGNOSIS — E1129 Type 2 diabetes mellitus with other diabetic kidney complication: Secondary | ICD-10-CM | POA: Diagnosis not present

## 2020-11-26 DIAGNOSIS — Z992 Dependence on renal dialysis: Secondary | ICD-10-CM | POA: Diagnosis not present

## 2020-11-28 DIAGNOSIS — D631 Anemia in chronic kidney disease: Secondary | ICD-10-CM | POA: Diagnosis not present

## 2020-11-28 DIAGNOSIS — N186 End stage renal disease: Secondary | ICD-10-CM | POA: Diagnosis not present

## 2020-11-28 DIAGNOSIS — N2581 Secondary hyperparathyroidism of renal origin: Secondary | ICD-10-CM | POA: Diagnosis not present

## 2020-11-28 DIAGNOSIS — E1129 Type 2 diabetes mellitus with other diabetic kidney complication: Secondary | ICD-10-CM | POA: Diagnosis not present

## 2020-11-28 DIAGNOSIS — Z992 Dependence on renal dialysis: Secondary | ICD-10-CM | POA: Diagnosis not present

## 2020-11-29 DIAGNOSIS — Z992 Dependence on renal dialysis: Secondary | ICD-10-CM | POA: Diagnosis not present

## 2020-11-29 DIAGNOSIS — I4891 Unspecified atrial fibrillation: Secondary | ICD-10-CM | POA: Diagnosis not present

## 2020-11-29 DIAGNOSIS — E1129 Type 2 diabetes mellitus with other diabetic kidney complication: Secondary | ICD-10-CM | POA: Diagnosis not present

## 2020-11-29 DIAGNOSIS — N186 End stage renal disease: Secondary | ICD-10-CM | POA: Diagnosis not present

## 2020-11-29 DIAGNOSIS — Z7901 Long term (current) use of anticoagulants: Secondary | ICD-10-CM | POA: Diagnosis not present

## 2020-11-30 DIAGNOSIS — Z992 Dependence on renal dialysis: Secondary | ICD-10-CM | POA: Diagnosis not present

## 2020-11-30 DIAGNOSIS — N186 End stage renal disease: Secondary | ICD-10-CM | POA: Diagnosis not present

## 2020-11-30 DIAGNOSIS — E1129 Type 2 diabetes mellitus with other diabetic kidney complication: Secondary | ICD-10-CM | POA: Diagnosis not present

## 2020-11-30 DIAGNOSIS — N2581 Secondary hyperparathyroidism of renal origin: Secondary | ICD-10-CM | POA: Diagnosis not present

## 2020-12-03 DIAGNOSIS — N2581 Secondary hyperparathyroidism of renal origin: Secondary | ICD-10-CM | POA: Diagnosis not present

## 2020-12-03 DIAGNOSIS — E1129 Type 2 diabetes mellitus with other diabetic kidney complication: Secondary | ICD-10-CM | POA: Diagnosis not present

## 2020-12-03 DIAGNOSIS — N186 End stage renal disease: Secondary | ICD-10-CM | POA: Diagnosis not present

## 2020-12-03 DIAGNOSIS — Z992 Dependence on renal dialysis: Secondary | ICD-10-CM | POA: Diagnosis not present

## 2020-12-05 DIAGNOSIS — E1129 Type 2 diabetes mellitus with other diabetic kidney complication: Secondary | ICD-10-CM | POA: Diagnosis not present

## 2020-12-05 DIAGNOSIS — Z992 Dependence on renal dialysis: Secondary | ICD-10-CM | POA: Diagnosis not present

## 2020-12-05 DIAGNOSIS — N2581 Secondary hyperparathyroidism of renal origin: Secondary | ICD-10-CM | POA: Diagnosis not present

## 2020-12-05 DIAGNOSIS — N186 End stage renal disease: Secondary | ICD-10-CM | POA: Diagnosis not present

## 2020-12-07 DIAGNOSIS — E1129 Type 2 diabetes mellitus with other diabetic kidney complication: Secondary | ICD-10-CM | POA: Diagnosis not present

## 2020-12-07 DIAGNOSIS — N2581 Secondary hyperparathyroidism of renal origin: Secondary | ICD-10-CM | POA: Diagnosis not present

## 2020-12-07 DIAGNOSIS — Z992 Dependence on renal dialysis: Secondary | ICD-10-CM | POA: Diagnosis not present

## 2020-12-07 DIAGNOSIS — N186 End stage renal disease: Secondary | ICD-10-CM | POA: Diagnosis not present

## 2020-12-10 DIAGNOSIS — N186 End stage renal disease: Secondary | ICD-10-CM | POA: Diagnosis not present

## 2020-12-10 DIAGNOSIS — E1129 Type 2 diabetes mellitus with other diabetic kidney complication: Secondary | ICD-10-CM | POA: Diagnosis not present

## 2020-12-10 DIAGNOSIS — Z992 Dependence on renal dialysis: Secondary | ICD-10-CM | POA: Diagnosis not present

## 2020-12-10 DIAGNOSIS — N2581 Secondary hyperparathyroidism of renal origin: Secondary | ICD-10-CM | POA: Diagnosis not present

## 2020-12-11 ENCOUNTER — Encounter: Payer: Medicare Other | Admitting: Vascular Surgery

## 2020-12-12 DIAGNOSIS — N186 End stage renal disease: Secondary | ICD-10-CM | POA: Diagnosis not present

## 2020-12-12 DIAGNOSIS — E1129 Type 2 diabetes mellitus with other diabetic kidney complication: Secondary | ICD-10-CM | POA: Diagnosis not present

## 2020-12-12 DIAGNOSIS — N2581 Secondary hyperparathyroidism of renal origin: Secondary | ICD-10-CM | POA: Diagnosis not present

## 2020-12-12 DIAGNOSIS — Z992 Dependence on renal dialysis: Secondary | ICD-10-CM | POA: Diagnosis not present

## 2020-12-14 DIAGNOSIS — E1129 Type 2 diabetes mellitus with other diabetic kidney complication: Secondary | ICD-10-CM | POA: Diagnosis not present

## 2020-12-14 DIAGNOSIS — N2581 Secondary hyperparathyroidism of renal origin: Secondary | ICD-10-CM | POA: Diagnosis not present

## 2020-12-14 DIAGNOSIS — Z992 Dependence on renal dialysis: Secondary | ICD-10-CM | POA: Diagnosis not present

## 2020-12-14 DIAGNOSIS — N186 End stage renal disease: Secondary | ICD-10-CM | POA: Diagnosis not present

## 2020-12-16 DIAGNOSIS — Z20822 Contact with and (suspected) exposure to covid-19: Secondary | ICD-10-CM | POA: Diagnosis not present

## 2020-12-17 DIAGNOSIS — N186 End stage renal disease: Secondary | ICD-10-CM | POA: Diagnosis not present

## 2020-12-17 DIAGNOSIS — N2581 Secondary hyperparathyroidism of renal origin: Secondary | ICD-10-CM | POA: Diagnosis not present

## 2020-12-17 DIAGNOSIS — E1129 Type 2 diabetes mellitus with other diabetic kidney complication: Secondary | ICD-10-CM | POA: Diagnosis not present

## 2020-12-17 DIAGNOSIS — Z992 Dependence on renal dialysis: Secondary | ICD-10-CM | POA: Diagnosis not present

## 2020-12-19 DIAGNOSIS — E039 Hypothyroidism, unspecified: Secondary | ICD-10-CM | POA: Diagnosis not present

## 2020-12-19 DIAGNOSIS — E1129 Type 2 diabetes mellitus with other diabetic kidney complication: Secondary | ICD-10-CM | POA: Diagnosis not present

## 2020-12-19 DIAGNOSIS — N2581 Secondary hyperparathyroidism of renal origin: Secondary | ICD-10-CM | POA: Diagnosis not present

## 2020-12-19 DIAGNOSIS — Z992 Dependence on renal dialysis: Secondary | ICD-10-CM | POA: Diagnosis not present

## 2020-12-19 DIAGNOSIS — N186 End stage renal disease: Secondary | ICD-10-CM | POA: Diagnosis not present

## 2020-12-21 DIAGNOSIS — Z992 Dependence on renal dialysis: Secondary | ICD-10-CM | POA: Diagnosis not present

## 2020-12-21 DIAGNOSIS — E1129 Type 2 diabetes mellitus with other diabetic kidney complication: Secondary | ICD-10-CM | POA: Diagnosis not present

## 2020-12-21 DIAGNOSIS — N186 End stage renal disease: Secondary | ICD-10-CM | POA: Diagnosis not present

## 2020-12-21 DIAGNOSIS — N2581 Secondary hyperparathyroidism of renal origin: Secondary | ICD-10-CM | POA: Diagnosis not present

## 2020-12-24 DIAGNOSIS — E1129 Type 2 diabetes mellitus with other diabetic kidney complication: Secondary | ICD-10-CM | POA: Diagnosis not present

## 2020-12-24 DIAGNOSIS — N2581 Secondary hyperparathyroidism of renal origin: Secondary | ICD-10-CM | POA: Diagnosis not present

## 2020-12-24 DIAGNOSIS — Z992 Dependence on renal dialysis: Secondary | ICD-10-CM | POA: Diagnosis not present

## 2020-12-24 DIAGNOSIS — N186 End stage renal disease: Secondary | ICD-10-CM | POA: Diagnosis not present

## 2020-12-26 DIAGNOSIS — N186 End stage renal disease: Secondary | ICD-10-CM | POA: Diagnosis not present

## 2020-12-26 DIAGNOSIS — N2581 Secondary hyperparathyroidism of renal origin: Secondary | ICD-10-CM | POA: Diagnosis not present

## 2020-12-26 DIAGNOSIS — E1129 Type 2 diabetes mellitus with other diabetic kidney complication: Secondary | ICD-10-CM | POA: Diagnosis not present

## 2020-12-26 DIAGNOSIS — Z992 Dependence on renal dialysis: Secondary | ICD-10-CM | POA: Diagnosis not present

## 2020-12-28 DIAGNOSIS — Z992 Dependence on renal dialysis: Secondary | ICD-10-CM | POA: Diagnosis not present

## 2020-12-28 DIAGNOSIS — N2581 Secondary hyperparathyroidism of renal origin: Secondary | ICD-10-CM | POA: Diagnosis not present

## 2020-12-28 DIAGNOSIS — N186 End stage renal disease: Secondary | ICD-10-CM | POA: Diagnosis not present

## 2020-12-28 DIAGNOSIS — E1129 Type 2 diabetes mellitus with other diabetic kidney complication: Secondary | ICD-10-CM | POA: Diagnosis not present

## 2020-12-30 DIAGNOSIS — N186 End stage renal disease: Secondary | ICD-10-CM | POA: Diagnosis not present

## 2020-12-30 DIAGNOSIS — Z20822 Contact with and (suspected) exposure to covid-19: Secondary | ICD-10-CM | POA: Diagnosis not present

## 2020-12-30 DIAGNOSIS — Z992 Dependence on renal dialysis: Secondary | ICD-10-CM | POA: Diagnosis not present

## 2020-12-30 DIAGNOSIS — E1129 Type 2 diabetes mellitus with other diabetic kidney complication: Secondary | ICD-10-CM | POA: Diagnosis not present

## 2020-12-30 DIAGNOSIS — N2581 Secondary hyperparathyroidism of renal origin: Secondary | ICD-10-CM | POA: Diagnosis not present

## 2020-12-31 DIAGNOSIS — N2581 Secondary hyperparathyroidism of renal origin: Secondary | ICD-10-CM | POA: Diagnosis not present

## 2020-12-31 DIAGNOSIS — E1129 Type 2 diabetes mellitus with other diabetic kidney complication: Secondary | ICD-10-CM | POA: Diagnosis not present

## 2020-12-31 DIAGNOSIS — Z992 Dependence on renal dialysis: Secondary | ICD-10-CM | POA: Diagnosis not present

## 2020-12-31 DIAGNOSIS — N186 End stage renal disease: Secondary | ICD-10-CM | POA: Diagnosis not present

## 2021-01-01 DIAGNOSIS — D6869 Other thrombophilia: Secondary | ICD-10-CM | POA: Diagnosis not present

## 2021-01-01 DIAGNOSIS — Z7901 Long term (current) use of anticoagulants: Secondary | ICD-10-CM | POA: Diagnosis not present

## 2021-01-01 DIAGNOSIS — I4891 Unspecified atrial fibrillation: Secondary | ICD-10-CM | POA: Diagnosis not present

## 2021-01-02 DIAGNOSIS — E1129 Type 2 diabetes mellitus with other diabetic kidney complication: Secondary | ICD-10-CM | POA: Diagnosis not present

## 2021-01-02 DIAGNOSIS — N2581 Secondary hyperparathyroidism of renal origin: Secondary | ICD-10-CM | POA: Diagnosis not present

## 2021-01-02 DIAGNOSIS — Z992 Dependence on renal dialysis: Secondary | ICD-10-CM | POA: Diagnosis not present

## 2021-01-02 DIAGNOSIS — N186 End stage renal disease: Secondary | ICD-10-CM | POA: Diagnosis not present

## 2021-01-06 ENCOUNTER — Ambulatory Visit: Payer: Medicare Other | Admitting: Podiatry

## 2021-01-07 DIAGNOSIS — N2581 Secondary hyperparathyroidism of renal origin: Secondary | ICD-10-CM | POA: Diagnosis not present

## 2021-01-07 DIAGNOSIS — Z992 Dependence on renal dialysis: Secondary | ICD-10-CM | POA: Diagnosis not present

## 2021-01-07 DIAGNOSIS — E1129 Type 2 diabetes mellitus with other diabetic kidney complication: Secondary | ICD-10-CM | POA: Diagnosis not present

## 2021-01-07 DIAGNOSIS — N186 End stage renal disease: Secondary | ICD-10-CM | POA: Diagnosis not present

## 2021-01-08 ENCOUNTER — Other Ambulatory Visit: Payer: Self-pay

## 2021-01-08 ENCOUNTER — Ambulatory Visit (INDEPENDENT_AMBULATORY_CARE_PROVIDER_SITE_OTHER): Payer: Medicare Other | Admitting: Podiatry

## 2021-01-08 ENCOUNTER — Encounter: Payer: Self-pay | Admitting: Podiatry

## 2021-01-08 DIAGNOSIS — Z89431 Acquired absence of right foot: Secondary | ICD-10-CM | POA: Diagnosis not present

## 2021-01-08 DIAGNOSIS — B351 Tinea unguium: Secondary | ICD-10-CM

## 2021-01-08 DIAGNOSIS — M79676 Pain in unspecified toe(s): Secondary | ICD-10-CM | POA: Diagnosis not present

## 2021-01-08 DIAGNOSIS — E0842 Diabetes mellitus due to underlying condition with diabetic polyneuropathy: Secondary | ICD-10-CM | POA: Diagnosis not present

## 2021-01-08 DIAGNOSIS — I739 Peripheral vascular disease, unspecified: Secondary | ICD-10-CM

## 2021-01-08 NOTE — Progress Notes (Signed)
Complaint:  Visit Type: Patient returns to my office for continued preventative foot care services. Complaint: Patient states" my nails have grown long and thick and become painful to walk and wear shoes on his left foot.  Patient has been diagnosed with DM with angiopathy.  Patient has had transmetatarsal amputation performed toes right foot.. The patient presents for preventative foot care services. No changes to ROS  Podiatric Exam: Vascular: dorsalis pedis and posterior tibial pulses are not  palpable left.  ... Capillary return is immediate. Temperature gradient is WNL. Skin turgor WNL  Significant swelling feet  Left  Sensorium: Normal Semmes Weinstein monofilament test. Normal tactile sensation left foot. Nail Exam: Pt has thick disfigured discolored nails with subungual debris noted  entire nail hallux through fifth toenails left foot. Ulcer Exam: There is no evidence of ulcer or pre-ulcerative changes or infection. Orthopedic Exam: Muscle tone and strength are WNL. No limitations in general ROM. No crepitus or effusions noted. Foot type and digits show no abnormalities. Bony prominences are unremarkable. TMA right foot. Skin: No Porokeratosis. No infection or ulcers  Diagnosis:  Onychomycosis, ,, pain in left toes,  TMA right foot.  Treatment & Plan Procedures and Treatment: Consent by patient was obtained for treatment procedures.   Debridement of mycotic and hypertrophic toenails, 1 through 5 left foot  and clearing of subungual debris. No ulceration, no infection noted.  Return Visit-Office Procedure: Patient instructed to return to the office for a follow up visit 3 months for continued evaluation and treatment.    Gardiner Barefoot DPM

## 2021-01-09 DIAGNOSIS — N186 End stage renal disease: Secondary | ICD-10-CM | POA: Diagnosis not present

## 2021-01-09 DIAGNOSIS — Z992 Dependence on renal dialysis: Secondary | ICD-10-CM | POA: Diagnosis not present

## 2021-01-09 DIAGNOSIS — E1129 Type 2 diabetes mellitus with other diabetic kidney complication: Secondary | ICD-10-CM | POA: Diagnosis not present

## 2021-01-09 DIAGNOSIS — N2581 Secondary hyperparathyroidism of renal origin: Secondary | ICD-10-CM | POA: Diagnosis not present

## 2021-01-11 DIAGNOSIS — N2581 Secondary hyperparathyroidism of renal origin: Secondary | ICD-10-CM | POA: Diagnosis not present

## 2021-01-11 DIAGNOSIS — E1129 Type 2 diabetes mellitus with other diabetic kidney complication: Secondary | ICD-10-CM | POA: Diagnosis not present

## 2021-01-11 DIAGNOSIS — Z992 Dependence on renal dialysis: Secondary | ICD-10-CM | POA: Diagnosis not present

## 2021-01-11 DIAGNOSIS — N186 End stage renal disease: Secondary | ICD-10-CM | POA: Diagnosis not present

## 2021-01-14 DIAGNOSIS — Z992 Dependence on renal dialysis: Secondary | ICD-10-CM | POA: Diagnosis not present

## 2021-01-14 DIAGNOSIS — N2581 Secondary hyperparathyroidism of renal origin: Secondary | ICD-10-CM | POA: Diagnosis not present

## 2021-01-14 DIAGNOSIS — N186 End stage renal disease: Secondary | ICD-10-CM | POA: Diagnosis not present

## 2021-01-14 DIAGNOSIS — E1129 Type 2 diabetes mellitus with other diabetic kidney complication: Secondary | ICD-10-CM | POA: Diagnosis not present

## 2021-01-15 DIAGNOSIS — D6869 Other thrombophilia: Secondary | ICD-10-CM | POA: Diagnosis not present

## 2021-01-15 DIAGNOSIS — Z7901 Long term (current) use of anticoagulants: Secondary | ICD-10-CM | POA: Diagnosis not present

## 2021-01-15 DIAGNOSIS — I4891 Unspecified atrial fibrillation: Secondary | ICD-10-CM | POA: Diagnosis not present

## 2021-01-16 DIAGNOSIS — N2581 Secondary hyperparathyroidism of renal origin: Secondary | ICD-10-CM | POA: Diagnosis not present

## 2021-01-16 DIAGNOSIS — N186 End stage renal disease: Secondary | ICD-10-CM | POA: Diagnosis not present

## 2021-01-16 DIAGNOSIS — Z992 Dependence on renal dialysis: Secondary | ICD-10-CM | POA: Diagnosis not present

## 2021-01-16 DIAGNOSIS — E1129 Type 2 diabetes mellitus with other diabetic kidney complication: Secondary | ICD-10-CM | POA: Diagnosis not present

## 2021-01-18 DIAGNOSIS — N186 End stage renal disease: Secondary | ICD-10-CM | POA: Diagnosis not present

## 2021-01-18 DIAGNOSIS — N2581 Secondary hyperparathyroidism of renal origin: Secondary | ICD-10-CM | POA: Diagnosis not present

## 2021-01-18 DIAGNOSIS — E1129 Type 2 diabetes mellitus with other diabetic kidney complication: Secondary | ICD-10-CM | POA: Diagnosis not present

## 2021-01-18 DIAGNOSIS — Z992 Dependence on renal dialysis: Secondary | ICD-10-CM | POA: Diagnosis not present

## 2021-01-20 ENCOUNTER — Encounter (INDEPENDENT_AMBULATORY_CARE_PROVIDER_SITE_OTHER): Payer: Medicare Other | Admitting: Ophthalmology

## 2021-01-21 DIAGNOSIS — N186 End stage renal disease: Secondary | ICD-10-CM | POA: Diagnosis not present

## 2021-01-21 DIAGNOSIS — Z992 Dependence on renal dialysis: Secondary | ICD-10-CM | POA: Diagnosis not present

## 2021-01-21 DIAGNOSIS — E1129 Type 2 diabetes mellitus with other diabetic kidney complication: Secondary | ICD-10-CM | POA: Diagnosis not present

## 2021-01-21 DIAGNOSIS — N2581 Secondary hyperparathyroidism of renal origin: Secondary | ICD-10-CM | POA: Diagnosis not present

## 2021-01-22 ENCOUNTER — Encounter (INDEPENDENT_AMBULATORY_CARE_PROVIDER_SITE_OTHER): Payer: Medicare Other | Admitting: Ophthalmology

## 2021-01-22 ENCOUNTER — Other Ambulatory Visit: Payer: Self-pay

## 2021-01-22 DIAGNOSIS — I10 Essential (primary) hypertension: Secondary | ICD-10-CM

## 2021-01-22 DIAGNOSIS — H35033 Hypertensive retinopathy, bilateral: Secondary | ICD-10-CM

## 2021-01-22 DIAGNOSIS — E113593 Type 2 diabetes mellitus with proliferative diabetic retinopathy without macular edema, bilateral: Secondary | ICD-10-CM | POA: Diagnosis not present

## 2021-01-22 DIAGNOSIS — H43813 Vitreous degeneration, bilateral: Secondary | ICD-10-CM | POA: Diagnosis not present

## 2021-01-23 DIAGNOSIS — N186 End stage renal disease: Secondary | ICD-10-CM | POA: Diagnosis not present

## 2021-01-23 DIAGNOSIS — N2581 Secondary hyperparathyroidism of renal origin: Secondary | ICD-10-CM | POA: Diagnosis not present

## 2021-01-23 DIAGNOSIS — Z992 Dependence on renal dialysis: Secondary | ICD-10-CM | POA: Diagnosis not present

## 2021-01-23 DIAGNOSIS — E1129 Type 2 diabetes mellitus with other diabetic kidney complication: Secondary | ICD-10-CM | POA: Diagnosis not present

## 2021-01-25 DIAGNOSIS — N2581 Secondary hyperparathyroidism of renal origin: Secondary | ICD-10-CM | POA: Diagnosis not present

## 2021-01-25 DIAGNOSIS — N186 End stage renal disease: Secondary | ICD-10-CM | POA: Diagnosis not present

## 2021-01-25 DIAGNOSIS — E1129 Type 2 diabetes mellitus with other diabetic kidney complication: Secondary | ICD-10-CM | POA: Diagnosis not present

## 2021-01-25 DIAGNOSIS — Z992 Dependence on renal dialysis: Secondary | ICD-10-CM | POA: Diagnosis not present

## 2021-01-28 DIAGNOSIS — N186 End stage renal disease: Secondary | ICD-10-CM | POA: Diagnosis not present

## 2021-01-28 DIAGNOSIS — E1129 Type 2 diabetes mellitus with other diabetic kidney complication: Secondary | ICD-10-CM | POA: Diagnosis not present

## 2021-01-28 DIAGNOSIS — N2581 Secondary hyperparathyroidism of renal origin: Secondary | ICD-10-CM | POA: Diagnosis not present

## 2021-01-28 DIAGNOSIS — Z992 Dependence on renal dialysis: Secondary | ICD-10-CM | POA: Diagnosis not present

## 2021-01-29 DIAGNOSIS — Z7901 Long term (current) use of anticoagulants: Secondary | ICD-10-CM | POA: Diagnosis not present

## 2021-01-29 DIAGNOSIS — I4891 Unspecified atrial fibrillation: Secondary | ICD-10-CM | POA: Diagnosis not present

## 2021-01-29 DIAGNOSIS — E1129 Type 2 diabetes mellitus with other diabetic kidney complication: Secondary | ICD-10-CM | POA: Diagnosis not present

## 2021-01-29 DIAGNOSIS — D6869 Other thrombophilia: Secondary | ICD-10-CM | POA: Diagnosis not present

## 2021-01-30 DIAGNOSIS — N2581 Secondary hyperparathyroidism of renal origin: Secondary | ICD-10-CM | POA: Diagnosis not present

## 2021-01-30 DIAGNOSIS — E1129 Type 2 diabetes mellitus with other diabetic kidney complication: Secondary | ICD-10-CM | POA: Diagnosis not present

## 2021-01-30 DIAGNOSIS — N186 End stage renal disease: Secondary | ICD-10-CM | POA: Diagnosis not present

## 2021-01-30 DIAGNOSIS — Z992 Dependence on renal dialysis: Secondary | ICD-10-CM | POA: Diagnosis not present

## 2021-02-01 DIAGNOSIS — N2581 Secondary hyperparathyroidism of renal origin: Secondary | ICD-10-CM | POA: Diagnosis not present

## 2021-02-01 DIAGNOSIS — Z992 Dependence on renal dialysis: Secondary | ICD-10-CM | POA: Diagnosis not present

## 2021-02-01 DIAGNOSIS — E1129 Type 2 diabetes mellitus with other diabetic kidney complication: Secondary | ICD-10-CM | POA: Diagnosis not present

## 2021-02-01 DIAGNOSIS — N186 End stage renal disease: Secondary | ICD-10-CM | POA: Diagnosis not present

## 2021-02-04 DIAGNOSIS — N186 End stage renal disease: Secondary | ICD-10-CM | POA: Diagnosis not present

## 2021-02-04 DIAGNOSIS — N2581 Secondary hyperparathyroidism of renal origin: Secondary | ICD-10-CM | POA: Diagnosis not present

## 2021-02-04 DIAGNOSIS — Z992 Dependence on renal dialysis: Secondary | ICD-10-CM | POA: Diagnosis not present

## 2021-02-04 DIAGNOSIS — E1129 Type 2 diabetes mellitus with other diabetic kidney complication: Secondary | ICD-10-CM | POA: Diagnosis not present

## 2021-02-06 DIAGNOSIS — N186 End stage renal disease: Secondary | ICD-10-CM | POA: Diagnosis not present

## 2021-02-06 DIAGNOSIS — E1129 Type 2 diabetes mellitus with other diabetic kidney complication: Secondary | ICD-10-CM | POA: Diagnosis not present

## 2021-02-06 DIAGNOSIS — Z992 Dependence on renal dialysis: Secondary | ICD-10-CM | POA: Diagnosis not present

## 2021-02-06 DIAGNOSIS — N2581 Secondary hyperparathyroidism of renal origin: Secondary | ICD-10-CM | POA: Diagnosis not present

## 2021-02-08 DIAGNOSIS — E1129 Type 2 diabetes mellitus with other diabetic kidney complication: Secondary | ICD-10-CM | POA: Diagnosis not present

## 2021-02-08 DIAGNOSIS — N2581 Secondary hyperparathyroidism of renal origin: Secondary | ICD-10-CM | POA: Diagnosis not present

## 2021-02-08 DIAGNOSIS — Z992 Dependence on renal dialysis: Secondary | ICD-10-CM | POA: Diagnosis not present

## 2021-02-08 DIAGNOSIS — N186 End stage renal disease: Secondary | ICD-10-CM | POA: Diagnosis not present

## 2021-02-11 DIAGNOSIS — Z992 Dependence on renal dialysis: Secondary | ICD-10-CM | POA: Diagnosis not present

## 2021-02-11 DIAGNOSIS — N2581 Secondary hyperparathyroidism of renal origin: Secondary | ICD-10-CM | POA: Diagnosis not present

## 2021-02-11 DIAGNOSIS — E1129 Type 2 diabetes mellitus with other diabetic kidney complication: Secondary | ICD-10-CM | POA: Diagnosis not present

## 2021-02-11 DIAGNOSIS — N186 End stage renal disease: Secondary | ICD-10-CM | POA: Diagnosis not present

## 2021-02-12 DIAGNOSIS — Z7901 Long term (current) use of anticoagulants: Secondary | ICD-10-CM | POA: Diagnosis not present

## 2021-02-12 DIAGNOSIS — Z20822 Contact with and (suspected) exposure to covid-19: Secondary | ICD-10-CM | POA: Diagnosis not present

## 2021-02-12 DIAGNOSIS — I4891 Unspecified atrial fibrillation: Secondary | ICD-10-CM | POA: Diagnosis not present

## 2021-02-12 DIAGNOSIS — D6869 Other thrombophilia: Secondary | ICD-10-CM | POA: Diagnosis not present

## 2021-02-12 DIAGNOSIS — Z23 Encounter for immunization: Secondary | ICD-10-CM | POA: Diagnosis not present

## 2021-02-13 DIAGNOSIS — E1129 Type 2 diabetes mellitus with other diabetic kidney complication: Secondary | ICD-10-CM | POA: Diagnosis not present

## 2021-02-13 DIAGNOSIS — N186 End stage renal disease: Secondary | ICD-10-CM | POA: Diagnosis not present

## 2021-02-13 DIAGNOSIS — Z992 Dependence on renal dialysis: Secondary | ICD-10-CM | POA: Diagnosis not present

## 2021-02-13 DIAGNOSIS — N2581 Secondary hyperparathyroidism of renal origin: Secondary | ICD-10-CM | POA: Diagnosis not present

## 2021-02-13 NOTE — Progress Notes (Deleted)
PCP:  Burnard Bunting, MD Primary Cardiologist: Pixie Casino, MD Electrophysiologist: Constance Haw, MD   [name] is a 70 y.o. male seen today for John [name], MD for {Blank single:19197::"cardiac clearance","post hospital follow up","acute visit due to ***","routine electrophysiology followup"}.  Since {Blank single:19197::"last being seen in our clinic","discharge from hospital"} the patient reports doing ***.  he denies chest pain, palpitations, dyspnea, PND, orthopnea, nausea, vomiting, dizziness, syncope, edema, weight gain, or early satiety.  Past Medical History:  Diagnosis Date   Anemia    Arthritis    HNP- lumbar, "all over my body"   Atrial fibrillation and flutter ([place])    Blood transfusion    "years ago; blood was low" (08/05/2013)   COPD (chronic obstructive pulmonary disease) (HCC)    Diabetic nephropathy (HCC)    Diabetic retinopathy    DVT (deep venous thrombosis) ([place])    "got one in my right leg now; I've had one before too, not sure which leg" (08/05/2013)   ESRD (end stage renal disease) on dialysis ([place])    TTS, [place]   Family history of anesthesia complication    " my son wakes up slowly"   GERD (gastroesophageal reflux disease)    uses alka seltzere on occas.    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 unsure of several answers to health history questions    Septic shock ([place])    Skin cancer    melanoma - head   Sleep apnea    "suppose to have a sleep study, but they never told me when. (08/05/2013)   Past Surgical History:  Procedure  Laterality Date   ACHILLES TENDON SURGERY Right 03/13/2016   Procedure: ACHILLES LENGTHENING/KIDNER;  Surgeon: Edrick Kins, DPM;  Location: [place];  Service: Podiatry;  Laterality: Right;   AV FISTULA PLACEMENT Left 02/03/2013   Procedure: ARTERIOVENOUS (AV) FISTULA CREATION- LEFT RADIAL CEPHALIC; ULTRASOUND GUIDED;  Surgeon: Mal Misty, MD;  Location: [place];  Service: Vascular;  Laterality: Left;   AV FISTULA PLACEMENT Right 11/08/2015   Procedure: RIGHT BRACHIOCEPHALIC ARTERIOVENOUS (AV) FISTULA CREATION;  Surgeon: Serafina Mitchell, MD;  Location: [place];  Service: Vascular;  Laterality: Right;   [place] Right 01/24/2016   Procedure: RIGHT SECOND STAGE BASILIC VEIN TRANSPOSITION;  Surgeon: Angelia Mould, MD;  Location: [place];  Service: Vascular;  Laterality: Right;   BIOPSY  05/17/2020   Procedure: BIOPSY;  Surgeon: Irving Copas., MD;  Location: [place];  Service: Gastroenterology;;   CARDIAC CATHETERIZATION     CHOLECYSTECTOMY N/A 05/18/2020   Procedure: LAPAROSCOPIC CHOLECYSTECTOMY WITH INTRAOPERATIVE CHOLANGIOGRAM;  Surgeon: [name], MD;  Location: [place];  Service: General;  Laterality: N/A;   COLONOSCOPY N/A 09/23/2015   Procedure: COLONOSCOPY;  Surgeon: Irene Shipper, MD;  Location: [facility];  Service: Endoscopy;  Laterality: N/A;   Coloscopy     ENDOSCOPIC RETROGRADE CHOLANGIOPANCREATOGRAPHY (ERCP) WITH PROPOFOL N/A 05/17/2020   Procedure: ENDOSCOPIC RETROGRADE CHOLANGIOPANCREATOGRAPHY (ERCP) WITH PROPOFOL;  Surgeon: Irving Copas., MD;  [facility];  Service: Gastroenterology;  Laterality: N/A;   ESOPHAGOGASTRODUODENOSCOPY (EGD) WITH PROPOFOL N/A 05/22/2020   Procedure: ESOPHAGOGASTRODUODENOSCOPY (EGD) WITH PROPOFOL;  Surgeon: Gatha Mayer, MD;  Location: [place];  Service: Endoscopy;  Laterality: N/A;   EYE SURGERY Bilateral    GAS INSERTION  05/10/2012   Procedure: INSERTION OF GAS;  Surgeon: Hayden Pedro, MD;  Location: [place];  Service: Ophthalmology;  Laterality: Right;   LEFT HEART CATH AND CORONARY ANGIOGRAPHY N/A 09/11/2016   Procedure: Left Heart Cath and Coronary Angiography;  Surgeon: Belva Crome, MD;  Location: [place] [facility];  Service: Cardiovascular;  Laterality: N/A;   LESION EXCISION Right 05/08/2015   Procedure: EXCISION SCALP LESION;  Surgeon: Erroll Luna, MD;  Location: [place];  Service: General;  Laterality: Right;   MEMBRANE PEEL  05/10/2012   Procedure: MEMBRANE PEEL;  Surgeon: Hayden Pedro, MD;  Location: [place];  Service: Ophthalmology;  Laterality: Right;   PARS PLANA VITRECTOMY  08/27/2011   Procedure: PARS PLANA VITRECTOMY WITH 25 GAUGE;  Surgeon: Hayden Pedro, MD;  Location: [place];  Service: Ophthalmology;  Laterality: Left;  Repair of complex traction retinal detachment left eye   PARS PLANA VITRECTOMY  05/10/2012   Procedure: PARS PLANA VITRECTOMY WITH 25 GAUGE;  Surgeon: Hayden Pedro, MD;  Location: [place];  Service: Ophthalmology;  Laterality: Right;  Repair Complex Traction Retinal Detachment   PHOTOCOAGULATION WITH LASER  05/10/2012   Procedure: PHOTOCOAGULATION WITH LASER;  Surgeon: Hayden Pedro, MD;  Location: [place];  Service: Ophthalmology;  Laterality: Right;   PORT-A-CATH REMOVAL     PORTACATH PLACEMENT     REMOVAL OF STONES  05/17/2020   Procedure: REMOVAL OF STONES;  Surgeon: Rush Landmark Telford Nab., MD;  Location: [place];  Service: Gastroenterology;;   REVISON OF ARTERIOVENOUS FISTULA Left 07/31/2020   Procedure: REVISION AND 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;  Surgeon: Rosetta Posner, MD;  Location: [place];  Service: Vascular;  Laterality: Left;   REVISON OF ARTERIOVENOUS FISTULA Left 11/06/2020   Procedure: EXCISION AND CLEANOUT OF LEFT ARM;  Surgeon: Rosetta Posner, MD;  Location: [place];  Service: Vascular;  Laterality: Left;   SPHINCTEROTOMY  05/17/2020   Procedure: Joan Mayans;  Surgeon:  Mansouraty, Telford Nab., MD;  Location: [facility] ENDOSCOPY;  Service: Gastroenterology;;   SUBXYPHOID PERICARDIAL WINDOW N/A 01/10/2018   Procedure: SUBXYPHOID PERICARDIAL WINDOW;  Surgeon: Rexene Alberts, MD;  Location: [place];  Service: Thoracic;  Laterality: N/A;   TRANSMETATARSAL AMPUTATION Right 03/13/2016   Procedure: TRANSMETATARSAL AMPUTATION;  Surgeon: Edrick Kins, DPM;  Location: [place];  Service: Podiatry;  Laterality: Right;   VENA CAVA FILTER PLACEMENT  09/2012   due to preparation for surgery    Current Outpatient Medications  Medication Sig Dispense Refill   acetaminophen (TYLENOL) 500 MG tablet Take 500-1,000 mg by mouth daily as needed (back pain).     B Complex-C-Folic Acid (RENAL MULTIVITAMIN FORMULA) TABS Take 1 tablet by mouth daily. Renal Tab II     Cholecalciferol (VITAMIN D3) 125 MCG (5000 UT) TABS Take 5,000 Units by mouth daily.     cinacalcet (SENSIPAR) 30 MG tablet SMARTSIG:1 Tablet(s) By Mouth Every Evening     doxercalciferol (HECTOROL) 4 MCG/2ML injection Inject 2.5 mLs (5 mcg total) into the vein Every Tuesday,Thursday,and Saturday with dialysis. 2 mL    enoxaparin (LOVENOX) 120 MG/0.8ML injection Inject 120 mg into the skin every 12 (twelve) hours.  ferrous sulfate 325 (65 FE) MG tablet Take 325 mg by mouth daily with breakfast.     insulin aspart (NOVOLOG) 100 UNIT/ML injection Inject 1-9 Units into the skin 3 (three) times daily before meals. (Patient taking differently: Inject 10 Units into the skin 2 (two) times daily with a meal.)     iron sucrose in sodium chloride 0.9 % 100 mL Iron Sucrose (Venofer)     lidocaine-prilocaine (EMLA) cream Apply 1 application topically as needed (Apply small amount to access site 1-2 hours before dialysis. Cover with occlusive dressing (saran wrap)).      Methoxy PEG-Epoetin Beta (MIRCERA IJ) Mircera     Multiple Vitamins-Minerals (MULTIVITAMIN WITH MINERALS) tablet Take 1 tablet by mouth daily.     multivitamin (RENA-VIT)  TABS tablet Take 1 tablet by mouth daily.     oxyCODONE-acetaminophen (PERCOCET) 5-325 MG tablet Take 1 tablet by mouth every 6 (six) hours as needed for severe pain. 8 tablet 0   pantoprazole (PROTONIX) 40 MG tablet Take 1 tablet (40 mg total) by mouth 2 (two) times daily. 60 tablet 1   sevelamer carbonate (RENVELA) 800 MG tablet Take 800 mg by mouth 3 (three) times daily with meals.     vitamin C (ASCORBIC ACID) 500 MG tablet Take 500 mg by mouth daily.     warfarin (COUMADIN) 5 MG tablet Take 7.5 mg by mouth daily.     zinc sulfate 220 (50 Zn) MG capsule Take 220 mg by mouth daily.     No current facility-administered medications for this visit.    No Known Allergies  Social History   Socioeconomic History   Marital status: Single    Spouse name: Not on file   Number of children: 1   Years of education: Not on file   Highest education level: Not on file  Occupational History   Occupation: Retired     Comment: truck driver  Tobacco Use   Smoking status: Never   Smokeless tobacco: Never  Scientific laboratory technician Use: Never used  Substance and Sexual Activity   Alcohol use: No    Alcohol/week: 0.0 standard drinks   Drug use: No   Sexual activity: Not Currently  Other Topics Concern   Not on file  Social History Narrative   Not on file   Social Determinants of Health   Financial Resource Strain: Not on file  Food Insecurity: Not on file  Transportation Needs: Not on file  Physical Activity: Not on file  Stress: Not on file  Social Connections: Not on file  Intimate Partner Violence: Not on file     Review of Systems: All other systems reviewed and are otherwise negative except as noted above.  Physical Exam: There were no vitals filed for this visit.  GEN- The patient is well appearing, alert and oriented x 3 today.   HEENT: normocephalic, atraumatic; sclera clear, conjunctiva pink; hearing intact; oropharynx clear; neck supple, no JVP Lymph- no cervical  lymphadenopathy Lungs- Clear to ausculation bilaterally, normal work of breathing.  No wheezes, rales, rhonchi Heart- Regular rate and rhythm, no murmurs, rubs or gallops, PMI not laterally displaced GI- soft, non-tender, non-distended, bowel sounds present, no hepatosplenomegaly Extremities- no clubbing, cyanosis, or edema; DP/PT/radial pulses 2+ bilaterally MS- no significant deformity or atrophy Skin- warm and dry, no rash or lesion Psych- euthymic mood, full affect Neuro- strength and sensation are intact  EKG is ordered. Personal review of EKG from today shows ***  Additional studies reviewed  include: Previous EP office notes. ***  Assessment and Plan:  1. Persistent atrial fib/ atrial flutter Continue coumadin for CHA2DS2-VASc of at least 3. No plans for rhythm control per chart  2. H/o atypical chest pain LHC with no CAD  3. Chronic systolic CHF / NICM -> EF normalized by Echo 04/2019 GDMT limited by hypotension on HD.  Continue BB  4. ESRD HD on ***  Shirley Friar, [place]  02/13/21 12:08 PM

## 2021-02-14 ENCOUNTER — Ambulatory Visit: Payer: Medicare Other | Admitting: Student

## 2021-02-14 DIAGNOSIS — N186 End stage renal disease: Secondary | ICD-10-CM

## 2021-02-14 DIAGNOSIS — I428 Other cardiomyopathies: Secondary | ICD-10-CM

## 2021-02-14 DIAGNOSIS — I313 Pericardial effusion (noninflammatory): Secondary | ICD-10-CM

## 2021-02-14 DIAGNOSIS — I480 Paroxysmal atrial fibrillation: Secondary | ICD-10-CM

## 2021-02-14 DIAGNOSIS — I959 Hypotension, unspecified: Secondary | ICD-10-CM

## 2021-02-15 DIAGNOSIS — N186 End stage renal disease: Secondary | ICD-10-CM | POA: Diagnosis not present

## 2021-02-15 DIAGNOSIS — N2581 Secondary hyperparathyroidism of renal origin: Secondary | ICD-10-CM | POA: Diagnosis not present

## 2021-02-15 DIAGNOSIS — Z992 Dependence on renal dialysis: Secondary | ICD-10-CM | POA: Diagnosis not present

## 2021-02-15 DIAGNOSIS — E1129 Type 2 diabetes mellitus with other diabetic kidney complication: Secondary | ICD-10-CM | POA: Diagnosis not present

## 2021-02-18 DIAGNOSIS — N186 End stage renal disease: Secondary | ICD-10-CM | POA: Diagnosis not present

## 2021-02-18 DIAGNOSIS — E1129 Type 2 diabetes mellitus with other diabetic kidney complication: Secondary | ICD-10-CM | POA: Diagnosis not present

## 2021-02-18 DIAGNOSIS — Z20822 Contact with and (suspected) exposure to covid-19: Secondary | ICD-10-CM | POA: Diagnosis not present

## 2021-02-18 DIAGNOSIS — Z992 Dependence on renal dialysis: Secondary | ICD-10-CM | POA: Diagnosis not present

## 2021-02-18 DIAGNOSIS — N2581 Secondary hyperparathyroidism of renal origin: Secondary | ICD-10-CM | POA: Diagnosis not present

## 2021-02-20 DIAGNOSIS — N2581 Secondary hyperparathyroidism of renal origin: Secondary | ICD-10-CM | POA: Diagnosis not present

## 2021-02-20 DIAGNOSIS — E1129 Type 2 diabetes mellitus with other diabetic kidney complication: Secondary | ICD-10-CM | POA: Diagnosis not present

## 2021-02-20 DIAGNOSIS — Z992 Dependence on renal dialysis: Secondary | ICD-10-CM | POA: Diagnosis not present

## 2021-02-20 DIAGNOSIS — N186 End stage renal disease: Secondary | ICD-10-CM | POA: Diagnosis not present

## 2021-02-21 NOTE — Progress Notes (Signed)
PCP:  Burnard Bunting, MD Primary Cardiologist: Pixie Casino, MD Electrophysiologist: Constance Haw, MD   [name] is a 70 y.o. male seen today for Will [name], MD for routine electrophysiology followup.  Since last being seen in our clinic the patient reports doing well overall. He has on-going work up for kidney transplant.  Myoview earlier this month at [facility] with ? Moderate scar but no ischemia. Echo with normal EF. He woke up from sleep a couple weeks ago with a single sharp pain in his chest. Has not recurrent. No exertional chest pain. No syncope.  he denies palpitations, dyspnea, PND, orthopnea, nausea, vomiting, dizziness, edema, weight gain, or early satiety.  Past Medical History:  Diagnosis Date   Anemia    Arthritis    HNP- lumbar, "all over my body"   Atrial fibrillation and flutter ([place])    Blood transfusion    "years ago; blood was low" (08/05/2013)   COPD (chronic obstructive pulmonary disease) (HCC)    Diabetic nephropathy (HCC)    Diabetic retinopathy    DVT (deep venous thrombosis) ([place])    "got one in my right leg now; I've had one before too, not sure which leg" (08/05/2013)   ESRD (end stage renal disease) on dialysis ([place])    TTS, [place]   Family history of anesthesia complication    " my son wakes up slowly"   GERD (gastroesophageal reflux disease)    uses alka seltzere on occas.    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 unsure of several answers to health history questions    Septic shock ([place])    Skin cancer    melanoma - head   Sleep apnea     "suppose to have a sleep study, but they never told me when. (08/05/2013)   Past Surgical History:  Procedure Laterality Date   ACHILLES TENDON SURGERY Right 03/13/2016   Procedure: ACHILLES LENGTHENING/KIDNER;  Surgeon: Edrick Kins, DPM;  Location: [place];  Service: Podiatry;  Laterality: Right;   AV FISTULA PLACEMENT Left 02/03/2013   Procedure: ARTERIOVENOUS (AV) FISTULA CREATION- LEFT RADIAL CEPHALIC; ULTRASOUND GUIDED;  Surgeon: Mal Misty, MD;  Location: [place];  Service: Vascular;  Laterality: Left;   AV FISTULA PLACEMENT Right 11/08/2015   Procedure: RIGHT BRACHIOCEPHALIC ARTERIOVENOUS (AV) FISTULA CREATION;  Surgeon: Serafina Mitchell, MD;  Location: [place];  Service: Vascular;  Laterality: Right;   [place] Right 01/24/2016   Procedure: RIGHT SECOND STAGE BASILIC VEIN TRANSPOSITION;  Surgeon: Angelia Mould, MD;  Location: [place];  Service: Vascular;  Laterality: Right;   BIOPSY  05/17/2020   Procedure: BIOPSY;  Surgeon: Irving Copas., MD;  Location: [place];  Service: Gastroenterology;;   CARDIAC CATHETERIZATION     CHOLECYSTECTOMY N/A 05/18/2020   Procedure: LAPAROSCOPIC CHOLECYSTECTOMY WITH INTRAOPERATIVE CHOLANGIOGRAM;  Surgeon: [name], MD;  Location: [place];  Service: General;  Laterality: N/A;   COLONOSCOPY N/A 09/23/2015   Procedure: COLONOSCOPY;  Surgeon: Irene Shipper, MD;  Location: [facility];  Service: Endoscopy;  Laterality: N/A;   Coloscopy     ENDOSCOPIC RETROGRADE CHOLANGIOPANCREATOGRAPHY (ERCP) WITH PROPOFOL N/A 05/17/2020   Procedure: ENDOSCOPIC RETROGRADE CHOLANGIOPANCREATOGRAPHY (ERCP) WITH PROPOFOL;  Surgeon: Rush Landmark Telford Nab., MD;  Location: [place];  Service: Gastroenterology;  Laterality: N/A;   ESOPHAGOGASTRODUODENOSCOPY (EGD) WITH PROPOFOL N/A 05/22/2020   Procedure: ESOPHAGOGASTRODUODENOSCOPY (EGD) WITH PROPOFOL;  Surgeon: Gatha Mayer, MD;  Location: [place];  Service: Endoscopy;   Laterality: N/A;   EYE SURGERY Bilateral    GAS INSERTION  05/10/2012   Procedure: INSERTION OF GAS;  Surgeon: Hayden Pedro, MD;  Location: [place];  Service: Ophthalmology;  Laterality: Right;   LEFT HEART CATH AND CORONARY ANGIOGRAPHY N/A 09/11/2016   Procedure: Left Heart Cath and Coronary Angiography;  Surgeon: Belva Crome, MD;  Location: [place] [facility];  Service: Cardiovascular;  Laterality: N/A;   LESION EXCISION Right 05/08/2015   Procedure: EXCISION SCALP LESION;  Surgeon: Erroll Luna, MD;  Location: [place];  Service: General;  Laterality: Right;   MEMBRANE PEEL  05/10/2012   Procedure: MEMBRANE PEEL;  Surgeon: Hayden Pedro, MD;  Location: [place];  Service: Ophthalmology;  Laterality: Right;   PARS PLANA VITRECTOMY  08/27/2011   Procedure: PARS PLANA VITRECTOMY WITH 25 GAUGE;  Surgeon: Hayden Pedro, MD;  Location: [place];  Service: Ophthalmology;  Laterality: Left;  Repair of complex traction retinal detachment left eye   PARS PLANA VITRECTOMY  05/10/2012   Procedure: PARS PLANA VITRECTOMY WITH 25 GAUGE;  Surgeon: Hayden Pedro, MD;  Location: [place];  Service: Ophthalmology;  Laterality: Right;  Repair Complex Traction Retinal Detachment   PHOTOCOAGULATION WITH LASER  05/10/2012   Procedure: PHOTOCOAGULATION WITH LASER;  Surgeon: Hayden Pedro, MD;  Location: [employer];  Service: Ophthalmology;  Laterality: Right;   PORT-A-CATH REMOVAL     PORTACATH PLACEMENT     REMOVAL OF STONES  05/17/2020   Procedure: REMOVAL OF STONES;  Surgeon: Rush Landmark Telford Nab., MD;  Location: [place];  Service: Gastroenterology;;   REVISON OF ARTERIOVENOUS FISTULA Left 07/31/2020   Procedure: REVISION AND 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;  Surgeon: Rosetta Posner, MD;  Location: [place];  Service: Vascular;  Laterality: Left;   REVISON OF ARTERIOVENOUS FISTULA Left 11/06/2020   Procedure: EXCISION AND CLEANOUT OF LEFT ARM;  Surgeon: Rosetta Posner, MD;  Location: [place];  Service: Vascular;  Laterality: Left;   SPHINCTEROTOMY  05/17/2020   Procedure: Joan Mayans;  Surgeon: Mansouraty, Telford Nab., MD;  Location: [facility] ENDOSCOPY;  Service: Gastroenterology;;   SUBXYPHOID PERICARDIAL WINDOW N/A 01/10/2018   Procedure: SUBXYPHOID PERICARDIAL WINDOW;  Surgeon: Rexene Alberts, MD;  Location: [place];  Service: Thoracic;  Laterality: N/A;   TRANSMETATARSAL AMPUTATION Right 03/13/2016   Procedure: TRANSMETATARSAL AMPUTATION;  Surgeon: Edrick Kins, DPM;  Location: [place];  Service: Podiatry;  Laterality: Right;   VENA CAVA FILTER PLACEMENT  09/2012   due to preparation for surgery    Current Outpatient Medications  Medication Sig Dispense Refill   acetaminophen (TYLENOL) 500 MG tablet Take 500-1,000 mg by mouth daily as needed (back pain).     B Complex-C-Folic Acid (RENAL MULTIVITAMIN FORMULA) TABS Take 1 tablet by mouth daily. Renal Tab II     Cholecalciferol (VITAMIN D3) 125 MCG (5000 UT) TABS Take 5,000 Units by mouth daily.     cinacalcet (SENSIPAR) 30 MG tablet SMARTSIG:1 Tablet(s) By Mouth Every Evening     doxercalciferol (  HECTOROL) 4 MCG/2ML injection Inject 2.5 mLs (5 mcg total) into the vein Every Tuesday,Thursday,and Saturday with dialysis. 2 mL    enoxaparin (LOVENOX) 120 MG/0.8ML injection Inject 120 mg into the skin every 12 (twelve) hours.     ferrous sulfate 325 (65 FE) MG tablet Take 325 mg by mouth daily with breakfast.     insulin aspart (NOVOLOG) 100 UNIT/ML injection Inject 1-9 Units into the skin 3 (three) times daily before meals.     iron sucrose in sodium chloride 0.9 % 100 mL Iron Sucrose (Venofer)     lidocaine-prilocaine (EMLA) cream Apply 1 application topically as needed (Apply small amount to access site 1-2 hours before dialysis. Cover with occlusive dressing (saran wrap)).      Methoxy PEG-Epoetin Beta (MIRCERA IJ) Mircera     Multiple Vitamins-Minerals (MULTIVITAMIN WITH MINERALS) tablet Take 1 tablet by mouth daily.      multivitamin (RENA-VIT) TABS tablet Take 1 tablet by mouth daily.     oxyCODONE-acetaminophen (PERCOCET) 5-325 MG tablet Take 1 tablet by mouth every 6 (six) hours as needed for severe pain. 8 tablet 0   pantoprazole (PROTONIX) 40 MG tablet Take 1 tablet (40 mg total) by mouth 2 (two) times daily. 60 tablet 1   sevelamer carbonate (RENVELA) 800 MG tablet Take 800 mg by mouth 3 (three) times daily with meals.     vitamin C (ASCORBIC ACID) 500 MG tablet Take 500 mg by mouth daily.     warfarin (COUMADIN) 5 MG tablet Take 7.5 mg by mouth daily.     zinc sulfate 220 (50 Zn) MG capsule Take 220 mg by mouth daily.     No current facility-administered medications for this visit.    No Known Allergies  Social History   Socioeconomic History   Marital status: Single    Spouse name: Not on file   Number of children: 1   Years of education: Not on file   Highest education level: Not on file  Occupational History   Occupation: Retired     Comment: truck driver  Tobacco Use   Smoking status: Never   Smokeless tobacco: Never  Scientific laboratory technician Use: Never used  Substance and Sexual Activity   Alcohol use: No    Alcohol/week: 0.0 standard drinks   Drug use: No   Sexual activity: Not Currently  Other Topics Concern   Not on file  Social History Narrative   Not on file   Social Determinants of Health   Financial Resource Strain: Not on file  Food Insecurity: Not on file  Transportation Needs: Not on file  Physical Activity: Not on file  Stress: Not on file  Social Connections: Not on file  Intimate Partner Violence: Not on file     Review of Systems: All other systems reviewed and are otherwise negative except as noted above.  Physical Exam: Vitals:   02/24/21 1023  BP: (!) 142/62  Pulse: 98  SpO2: 94%  Weight: 252 lb 6.4 oz (114.5 kg)  Height: 6\' 3"  (1.905 m)    GEN- The patient is well appearing, alert and oriented x 3 today.   HEENT: normocephalic, atraumatic;  sclera clear, conjunctiva pink; hearing intact; oropharynx clear; neck supple, no JVP Lymph- no cervical lymphadenopathy Lungs- Clear to ausculation bilaterally, normal work of breathing.  No wheezes, rales, rhonchi Heart- Regular rate and rhythm, no murmurs, rubs or gallops, PMI not laterally displaced GI- soft, non-tender, non-distended, bowel sounds present, no hepatosplenomegaly Extremities- no  clubbing, cyanosis, or edema; DP/PT/radial pulses 2+ bilaterally MS- no significant deformity or atrophy Skin- warm and dry, no rash or lesion Psych- euthymic mood, full affect Neuro- strength and sensation are intact  EKG is ordered. Personal review of EKG from today shows likely AFL (chronic) at 98 bpm with RBBB/IVCD at 170 ms  Additional studies reviewed include: Previous EP office notes.   Myoview 02/07/2021 [facility]) Abnormal myocardial perfusion study  - There is a moderate sized, moderately severe, predominately fixed  partially reversible perfusion defect involving the apical, apical  inferior, apical lateral and mid inferolateral segments. This is  consistent with possible scar and subtle underlying ischemia but, cannot  rule out artifact (attenuation, motion).  - Left ventricular systolic function is normal. Post stress the ejection  fraction is > 60%.  - Coronary calcifications are noted  - CT finding show likely pulmonary congestion and bibasilar atelectasis .  - Attenuation CT scan shows post cholecystectomy findings   Echo 02/07/2021 LVEF >55%  Assessment and Plan:  1.  Atrial fibrillation/atrial flutter:  Continue coumadin for CHA2DS2-VASc of at least 3.  He has been asymptomatic from his atrial flutter.  No plans for rhythm control at this time.   2. HTN Stable on current regimen  He takes midodrine with dialysis.    3. Chronic systolic heart failure due to nonischemic cardiomyopathy: Echo 04/2019 with normalization of his EF.  Echo 02/07/2021 at [facility] showed EF remains  >55% GDMT limited somewhat from ESRD  4. ESRD Prepping for renal transplant  Currently tolerating T/Th/Sat HD.  RTC 6 months. Sooner with issues  Shirley Friar, [place]  02/24/21 10:36 AM

## 2021-02-22 DIAGNOSIS — N186 End stage renal disease: Secondary | ICD-10-CM | POA: Diagnosis not present

## 2021-02-22 DIAGNOSIS — Z992 Dependence on renal dialysis: Secondary | ICD-10-CM | POA: Diagnosis not present

## 2021-02-22 DIAGNOSIS — N2581 Secondary hyperparathyroidism of renal origin: Secondary | ICD-10-CM | POA: Diagnosis not present

## 2021-02-22 DIAGNOSIS — E1129 Type 2 diabetes mellitus with other diabetic kidney complication: Secondary | ICD-10-CM | POA: Diagnosis not present

## 2021-02-24 ENCOUNTER — Encounter: Payer: Self-pay | Admitting: Student

## 2021-02-24 ENCOUNTER — Other Ambulatory Visit: Payer: Self-pay

## 2021-02-24 ENCOUNTER — Ambulatory Visit (INDEPENDENT_AMBULATORY_CARE_PROVIDER_SITE_OTHER): Payer: Medicare Other | Admitting: Student

## 2021-02-24 VITALS — BP 142/62 | HR 98 | Ht 75.0 in | Wt 252.4 lb

## 2021-02-24 DIAGNOSIS — I480 Paroxysmal atrial fibrillation: Secondary | ICD-10-CM

## 2021-02-24 DIAGNOSIS — N186 End stage renal disease: Secondary | ICD-10-CM | POA: Diagnosis not present

## 2021-02-24 DIAGNOSIS — Z992 Dependence on renal dialysis: Secondary | ICD-10-CM | POA: Diagnosis not present

## 2021-02-24 DIAGNOSIS — I10 Essential (primary) hypertension: Secondary | ICD-10-CM

## 2021-02-24 DIAGNOSIS — I428 Other cardiomyopathies: Secondary | ICD-10-CM | POA: Diagnosis not present

## 2021-02-24 NOTE — Patient Instructions (Signed)
Medication Instructions:  °Your physician recommends that you continue on your current medications as directed. Please refer to the Current Medication list given to you today. ° °*If you need a refill on your cardiac medications before your next appointment, please call your pharmacy* ° ° °Lab Work: °None °If you have labs (blood work) drawn today and your tests are completely normal, you will receive your results only by: °MyChart Message (if you have MyChart) OR °A paper copy in the mail °If you have any lab test that is abnormal or we need to change your treatment, we will call you to review the results. ° ° °Follow-Up: °At [facility], you and your health needs are our priority.  As part of our continuing mission to provide you with exceptional heart care, we have created designated Provider Care Teams.  These Care Teams include your primary Cardiologist (physician) and Advanced Practice Providers (APPs -  Physician Assistants and Nurse Practitioners) who all work together to provide you with the care you need, when you need it. ° °We recommend signing up for the patient portal called "MyChart".  Sign up information is provided on this After Visit Summary.  MyChart is used to connect with patients for Virtual Visits (Telemedicine).  Patients are able to view lab/test results, encounter notes, upcoming appointments, etc.  Non-urgent messages can be sent to your provider as well.   °To learn more about what you can do with MyChart, go to [email or web address].   ° °Your next appointment:   °6 month(s) ° °The format for your next appointment:   °In Person ° °Provider:   °You may see Will Martin Camnitz, MD or one of the following Advanced Practice Providers on your designated Care Team:   °Renee Ursuy, PA-C °Michael "Andy" Tillery, PA-C  °  °

## 2021-02-25 DIAGNOSIS — N2581 Secondary hyperparathyroidism of renal origin: Secondary | ICD-10-CM | POA: Diagnosis not present

## 2021-02-25 DIAGNOSIS — E1129 Type 2 diabetes mellitus with other diabetic kidney complication: Secondary | ICD-10-CM | POA: Diagnosis not present

## 2021-02-25 DIAGNOSIS — Z992 Dependence on renal dialysis: Secondary | ICD-10-CM | POA: Diagnosis not present

## 2021-02-25 DIAGNOSIS — N186 End stage renal disease: Secondary | ICD-10-CM | POA: Diagnosis not present

## 2021-02-26 DIAGNOSIS — D6869 Other thrombophilia: Secondary | ICD-10-CM | POA: Diagnosis not present

## 2021-02-26 DIAGNOSIS — I4891 Unspecified atrial fibrillation: Secondary | ICD-10-CM | POA: Diagnosis not present

## 2021-02-26 DIAGNOSIS — Z7901 Long term (current) use of anticoagulants: Secondary | ICD-10-CM | POA: Diagnosis not present

## 2021-02-27 DIAGNOSIS — N2581 Secondary hyperparathyroidism of renal origin: Secondary | ICD-10-CM | POA: Diagnosis not present

## 2021-02-27 DIAGNOSIS — N186 End stage renal disease: Secondary | ICD-10-CM | POA: Diagnosis not present

## 2021-02-27 DIAGNOSIS — E1129 Type 2 diabetes mellitus with other diabetic kidney complication: Secondary | ICD-10-CM | POA: Diagnosis not present

## 2021-02-27 DIAGNOSIS — Z992 Dependence on renal dialysis: Secondary | ICD-10-CM | POA: Diagnosis not present

## 2021-03-01 DIAGNOSIS — N186 End stage renal disease: Secondary | ICD-10-CM | POA: Diagnosis not present

## 2021-03-01 DIAGNOSIS — Z992 Dependence on renal dialysis: Secondary | ICD-10-CM | POA: Diagnosis not present

## 2021-03-01 DIAGNOSIS — N2581 Secondary hyperparathyroidism of renal origin: Secondary | ICD-10-CM | POA: Diagnosis not present

## 2021-03-01 DIAGNOSIS — E1129 Type 2 diabetes mellitus with other diabetic kidney complication: Secondary | ICD-10-CM | POA: Diagnosis not present

## 2021-03-04 DIAGNOSIS — N186 End stage renal disease: Secondary | ICD-10-CM | POA: Diagnosis not present

## 2021-03-04 DIAGNOSIS — Z992 Dependence on renal dialysis: Secondary | ICD-10-CM | POA: Diagnosis not present

## 2021-03-04 DIAGNOSIS — N2581 Secondary hyperparathyroidism of renal origin: Secondary | ICD-10-CM | POA: Diagnosis not present

## 2021-03-04 DIAGNOSIS — E1129 Type 2 diabetes mellitus with other diabetic kidney complication: Secondary | ICD-10-CM | POA: Diagnosis not present

## 2021-03-06 DIAGNOSIS — E1129 Type 2 diabetes mellitus with other diabetic kidney complication: Secondary | ICD-10-CM | POA: Diagnosis not present

## 2021-03-06 DIAGNOSIS — N186 End stage renal disease: Secondary | ICD-10-CM | POA: Diagnosis not present

## 2021-03-06 DIAGNOSIS — N2581 Secondary hyperparathyroidism of renal origin: Secondary | ICD-10-CM | POA: Diagnosis not present

## 2021-03-06 DIAGNOSIS — Z992 Dependence on renal dialysis: Secondary | ICD-10-CM | POA: Diagnosis not present

## 2021-03-08 DIAGNOSIS — N2581 Secondary hyperparathyroidism of renal origin: Secondary | ICD-10-CM | POA: Diagnosis not present

## 2021-03-08 DIAGNOSIS — Z992 Dependence on renal dialysis: Secondary | ICD-10-CM | POA: Diagnosis not present

## 2021-03-08 DIAGNOSIS — N186 End stage renal disease: Secondary | ICD-10-CM | POA: Diagnosis not present

## 2021-03-08 DIAGNOSIS — E1129 Type 2 diabetes mellitus with other diabetic kidney complication: Secondary | ICD-10-CM | POA: Diagnosis not present

## 2021-03-10 DIAGNOSIS — E1139 Type 2 diabetes mellitus with other diabetic ophthalmic complication: Secondary | ICD-10-CM | POA: Diagnosis not present

## 2021-03-10 DIAGNOSIS — Z125 Encounter for screening for malignant neoplasm of prostate: Secondary | ICD-10-CM | POA: Diagnosis not present

## 2021-03-10 DIAGNOSIS — E785 Hyperlipidemia, unspecified: Secondary | ICD-10-CM | POA: Diagnosis not present

## 2021-03-11 DIAGNOSIS — N2581 Secondary hyperparathyroidism of renal origin: Secondary | ICD-10-CM | POA: Diagnosis not present

## 2021-03-11 DIAGNOSIS — N186 End stage renal disease: Secondary | ICD-10-CM | POA: Diagnosis not present

## 2021-03-11 DIAGNOSIS — E1129 Type 2 diabetes mellitus with other diabetic kidney complication: Secondary | ICD-10-CM | POA: Diagnosis not present

## 2021-03-11 DIAGNOSIS — Z992 Dependence on renal dialysis: Secondary | ICD-10-CM | POA: Diagnosis not present

## 2021-03-12 DIAGNOSIS — R110 Nausea: Secondary | ICD-10-CM | POA: Insufficient documentation

## 2021-03-13 DIAGNOSIS — N2581 Secondary hyperparathyroidism of renal origin: Secondary | ICD-10-CM | POA: Diagnosis not present

## 2021-03-13 DIAGNOSIS — E1129 Type 2 diabetes mellitus with other diabetic kidney complication: Secondary | ICD-10-CM | POA: Diagnosis not present

## 2021-03-13 DIAGNOSIS — Z992 Dependence on renal dialysis: Secondary | ICD-10-CM | POA: Diagnosis not present

## 2021-03-13 DIAGNOSIS — N186 End stage renal disease: Secondary | ICD-10-CM | POA: Diagnosis not present

## 2021-03-15 DIAGNOSIS — N186 End stage renal disease: Secondary | ICD-10-CM | POA: Diagnosis not present

## 2021-03-15 DIAGNOSIS — N2581 Secondary hyperparathyroidism of renal origin: Secondary | ICD-10-CM | POA: Diagnosis not present

## 2021-03-15 DIAGNOSIS — Z992 Dependence on renal dialysis: Secondary | ICD-10-CM | POA: Diagnosis not present

## 2021-03-15 DIAGNOSIS — E1129 Type 2 diabetes mellitus with other diabetic kidney complication: Secondary | ICD-10-CM | POA: Diagnosis not present

## 2021-03-18 DIAGNOSIS — N186 End stage renal disease: Secondary | ICD-10-CM | POA: Diagnosis not present

## 2021-03-18 DIAGNOSIS — Z992 Dependence on renal dialysis: Secondary | ICD-10-CM | POA: Diagnosis not present

## 2021-03-18 DIAGNOSIS — N2581 Secondary hyperparathyroidism of renal origin: Secondary | ICD-10-CM | POA: Diagnosis not present

## 2021-03-18 DIAGNOSIS — E1129 Type 2 diabetes mellitus with other diabetic kidney complication: Secondary | ICD-10-CM | POA: Diagnosis not present

## 2021-03-20 DIAGNOSIS — E039 Hypothyroidism, unspecified: Secondary | ICD-10-CM | POA: Diagnosis not present

## 2021-03-20 DIAGNOSIS — N2581 Secondary hyperparathyroidism of renal origin: Secondary | ICD-10-CM | POA: Diagnosis not present

## 2021-03-20 DIAGNOSIS — N186 End stage renal disease: Secondary | ICD-10-CM | POA: Diagnosis not present

## 2021-03-20 DIAGNOSIS — E1129 Type 2 diabetes mellitus with other diabetic kidney complication: Secondary | ICD-10-CM | POA: Diagnosis not present

## 2021-03-20 DIAGNOSIS — Z992 Dependence on renal dialysis: Secondary | ICD-10-CM | POA: Diagnosis not present

## 2021-03-22 DIAGNOSIS — N186 End stage renal disease: Secondary | ICD-10-CM | POA: Diagnosis not present

## 2021-03-22 DIAGNOSIS — Z992 Dependence on renal dialysis: Secondary | ICD-10-CM | POA: Diagnosis not present

## 2021-03-22 DIAGNOSIS — E1129 Type 2 diabetes mellitus with other diabetic kidney complication: Secondary | ICD-10-CM | POA: Diagnosis not present

## 2021-03-22 DIAGNOSIS — N2581 Secondary hyperparathyroidism of renal origin: Secondary | ICD-10-CM | POA: Diagnosis not present

## 2021-03-24 DIAGNOSIS — N186 End stage renal disease: Secondary | ICD-10-CM | POA: Diagnosis not present

## 2021-03-24 DIAGNOSIS — Z1339 Encounter for screening examination for other mental health and behavioral disorders: Secondary | ICD-10-CM | POA: Diagnosis not present

## 2021-03-24 DIAGNOSIS — Z992 Dependence on renal dialysis: Secondary | ICD-10-CM | POA: Diagnosis not present

## 2021-03-24 DIAGNOSIS — I130 Hypertensive heart and chronic kidney disease with heart failure and stage 1 through stage 4 chronic kidney disease, or unspecified chronic kidney disease: Secondary | ICD-10-CM | POA: Diagnosis not present

## 2021-03-24 DIAGNOSIS — Z0000 Encounter for general adult medical examination without abnormal findings: Secondary | ICD-10-CM | POA: Diagnosis not present

## 2021-03-24 DIAGNOSIS — E1139 Type 2 diabetes mellitus with other diabetic ophthalmic complication: Secondary | ICD-10-CM | POA: Diagnosis not present

## 2021-03-24 DIAGNOSIS — E1129 Type 2 diabetes mellitus with other diabetic kidney complication: Secondary | ICD-10-CM | POA: Diagnosis not present

## 2021-03-24 DIAGNOSIS — Z20822 Contact with and (suspected) exposure to covid-19: Secondary | ICD-10-CM | POA: Diagnosis not present

## 2021-03-24 DIAGNOSIS — D638 Anemia in other chronic diseases classified elsewhere: Secondary | ICD-10-CM | POA: Diagnosis not present

## 2021-03-24 DIAGNOSIS — Z1331 Encounter for screening for depression: Secondary | ICD-10-CM | POA: Diagnosis not present

## 2021-03-25 DIAGNOSIS — N2581 Secondary hyperparathyroidism of renal origin: Secondary | ICD-10-CM | POA: Diagnosis not present

## 2021-03-25 DIAGNOSIS — E1129 Type 2 diabetes mellitus with other diabetic kidney complication: Secondary | ICD-10-CM | POA: Diagnosis not present

## 2021-03-25 DIAGNOSIS — Z992 Dependence on renal dialysis: Secondary | ICD-10-CM | POA: Diagnosis not present

## 2021-03-25 DIAGNOSIS — N186 End stage renal disease: Secondary | ICD-10-CM | POA: Diagnosis not present

## 2021-03-27 DIAGNOSIS — E1129 Type 2 diabetes mellitus with other diabetic kidney complication: Secondary | ICD-10-CM | POA: Diagnosis not present

## 2021-03-27 DIAGNOSIS — N2581 Secondary hyperparathyroidism of renal origin: Secondary | ICD-10-CM | POA: Diagnosis not present

## 2021-03-27 DIAGNOSIS — Z992 Dependence on renal dialysis: Secondary | ICD-10-CM | POA: Diagnosis not present

## 2021-03-27 DIAGNOSIS — N186 End stage renal disease: Secondary | ICD-10-CM | POA: Diagnosis not present

## 2021-03-29 DIAGNOSIS — E1129 Type 2 diabetes mellitus with other diabetic kidney complication: Secondary | ICD-10-CM | POA: Diagnosis not present

## 2021-03-29 DIAGNOSIS — N186 End stage renal disease: Secondary | ICD-10-CM | POA: Diagnosis not present

## 2021-03-29 DIAGNOSIS — Z992 Dependence on renal dialysis: Secondary | ICD-10-CM | POA: Diagnosis not present

## 2021-03-29 DIAGNOSIS — N2581 Secondary hyperparathyroidism of renal origin: Secondary | ICD-10-CM | POA: Diagnosis not present

## 2021-04-01 DIAGNOSIS — Z992 Dependence on renal dialysis: Secondary | ICD-10-CM | POA: Diagnosis not present

## 2021-04-01 DIAGNOSIS — N2581 Secondary hyperparathyroidism of renal origin: Secondary | ICD-10-CM | POA: Diagnosis not present

## 2021-04-01 DIAGNOSIS — E1129 Type 2 diabetes mellitus with other diabetic kidney complication: Secondary | ICD-10-CM | POA: Diagnosis not present

## 2021-04-01 DIAGNOSIS — N186 End stage renal disease: Secondary | ICD-10-CM | POA: Diagnosis not present

## 2021-04-02 DIAGNOSIS — Z7901 Long term (current) use of anticoagulants: Secondary | ICD-10-CM | POA: Diagnosis not present

## 2021-04-02 DIAGNOSIS — D6869 Other thrombophilia: Secondary | ICD-10-CM | POA: Diagnosis not present

## 2021-04-02 DIAGNOSIS — I4891 Unspecified atrial fibrillation: Secondary | ICD-10-CM | POA: Diagnosis not present

## 2021-04-03 DIAGNOSIS — N2581 Secondary hyperparathyroidism of renal origin: Secondary | ICD-10-CM | POA: Diagnosis not present

## 2021-04-03 DIAGNOSIS — N186 End stage renal disease: Secondary | ICD-10-CM | POA: Diagnosis not present

## 2021-04-03 DIAGNOSIS — Z992 Dependence on renal dialysis: Secondary | ICD-10-CM | POA: Diagnosis not present

## 2021-04-03 DIAGNOSIS — E1129 Type 2 diabetes mellitus with other diabetic kidney complication: Secondary | ICD-10-CM | POA: Diagnosis not present

## 2021-04-05 DIAGNOSIS — Z992 Dependence on renal dialysis: Secondary | ICD-10-CM | POA: Diagnosis not present

## 2021-04-05 DIAGNOSIS — E1129 Type 2 diabetes mellitus with other diabetic kidney complication: Secondary | ICD-10-CM | POA: Diagnosis not present

## 2021-04-05 DIAGNOSIS — N186 End stage renal disease: Secondary | ICD-10-CM | POA: Diagnosis not present

## 2021-04-05 DIAGNOSIS — N2581 Secondary hyperparathyroidism of renal origin: Secondary | ICD-10-CM | POA: Diagnosis not present

## 2021-04-08 DIAGNOSIS — E1129 Type 2 diabetes mellitus with other diabetic kidney complication: Secondary | ICD-10-CM | POA: Diagnosis not present

## 2021-04-08 DIAGNOSIS — N186 End stage renal disease: Secondary | ICD-10-CM | POA: Diagnosis not present

## 2021-04-08 DIAGNOSIS — Z992 Dependence on renal dialysis: Secondary | ICD-10-CM | POA: Diagnosis not present

## 2021-04-08 DIAGNOSIS — N2581 Secondary hyperparathyroidism of renal origin: Secondary | ICD-10-CM | POA: Diagnosis not present

## 2021-04-10 DIAGNOSIS — E1129 Type 2 diabetes mellitus with other diabetic kidney complication: Secondary | ICD-10-CM | POA: Diagnosis not present

## 2021-04-10 DIAGNOSIS — N186 End stage renal disease: Secondary | ICD-10-CM | POA: Diagnosis not present

## 2021-04-10 DIAGNOSIS — N2581 Secondary hyperparathyroidism of renal origin: Secondary | ICD-10-CM | POA: Diagnosis not present

## 2021-04-10 DIAGNOSIS — Z992 Dependence on renal dialysis: Secondary | ICD-10-CM | POA: Diagnosis not present

## 2021-04-11 ENCOUNTER — Ambulatory Visit: Payer: Medicare Other | Admitting: Podiatry

## 2021-04-12 DIAGNOSIS — Z992 Dependence on renal dialysis: Secondary | ICD-10-CM | POA: Diagnosis not present

## 2021-04-12 DIAGNOSIS — N2581 Secondary hyperparathyroidism of renal origin: Secondary | ICD-10-CM | POA: Diagnosis not present

## 2021-04-12 DIAGNOSIS — E1129 Type 2 diabetes mellitus with other diabetic kidney complication: Secondary | ICD-10-CM | POA: Diagnosis not present

## 2021-04-12 DIAGNOSIS — N186 End stage renal disease: Secondary | ICD-10-CM | POA: Diagnosis not present

## 2021-04-15 DIAGNOSIS — N186 End stage renal disease: Secondary | ICD-10-CM | POA: Diagnosis not present

## 2021-04-15 DIAGNOSIS — E1129 Type 2 diabetes mellitus with other diabetic kidney complication: Secondary | ICD-10-CM | POA: Diagnosis not present

## 2021-04-15 DIAGNOSIS — Z992 Dependence on renal dialysis: Secondary | ICD-10-CM | POA: Diagnosis not present

## 2021-04-15 DIAGNOSIS — N2581 Secondary hyperparathyroidism of renal origin: Secondary | ICD-10-CM | POA: Diagnosis not present

## 2021-04-17 DIAGNOSIS — Z992 Dependence on renal dialysis: Secondary | ICD-10-CM | POA: Diagnosis not present

## 2021-04-17 DIAGNOSIS — E1129 Type 2 diabetes mellitus with other diabetic kidney complication: Secondary | ICD-10-CM | POA: Diagnosis not present

## 2021-04-17 DIAGNOSIS — N186 End stage renal disease: Secondary | ICD-10-CM | POA: Diagnosis not present

## 2021-04-17 DIAGNOSIS — N2581 Secondary hyperparathyroidism of renal origin: Secondary | ICD-10-CM | POA: Diagnosis not present

## 2021-04-19 DIAGNOSIS — Z992 Dependence on renal dialysis: Secondary | ICD-10-CM | POA: Diagnosis not present

## 2021-04-19 DIAGNOSIS — N2581 Secondary hyperparathyroidism of renal origin: Secondary | ICD-10-CM | POA: Diagnosis not present

## 2021-04-19 DIAGNOSIS — E1129 Type 2 diabetes mellitus with other diabetic kidney complication: Secondary | ICD-10-CM | POA: Diagnosis not present

## 2021-04-19 DIAGNOSIS — N186 End stage renal disease: Secondary | ICD-10-CM | POA: Diagnosis not present

## 2021-04-22 DIAGNOSIS — N2581 Secondary hyperparathyroidism of renal origin: Secondary | ICD-10-CM | POA: Diagnosis not present

## 2021-04-22 DIAGNOSIS — E1129 Type 2 diabetes mellitus with other diabetic kidney complication: Secondary | ICD-10-CM | POA: Diagnosis not present

## 2021-04-22 DIAGNOSIS — Z992 Dependence on renal dialysis: Secondary | ICD-10-CM | POA: Diagnosis not present

## 2021-04-22 DIAGNOSIS — N186 End stage renal disease: Secondary | ICD-10-CM | POA: Diagnosis not present

## 2021-04-25 DIAGNOSIS — E1129 Type 2 diabetes mellitus with other diabetic kidney complication: Secondary | ICD-10-CM | POA: Diagnosis not present

## 2021-04-25 DIAGNOSIS — N186 End stage renal disease: Secondary | ICD-10-CM | POA: Diagnosis not present

## 2021-04-25 DIAGNOSIS — Z992 Dependence on renal dialysis: Secondary | ICD-10-CM | POA: Diagnosis not present

## 2021-04-25 DIAGNOSIS — N2581 Secondary hyperparathyroidism of renal origin: Secondary | ICD-10-CM | POA: Diagnosis not present

## 2021-04-27 DIAGNOSIS — E1129 Type 2 diabetes mellitus with other diabetic kidney complication: Secondary | ICD-10-CM | POA: Diagnosis not present

## 2021-04-27 DIAGNOSIS — Z992 Dependence on renal dialysis: Secondary | ICD-10-CM | POA: Diagnosis not present

## 2021-04-27 DIAGNOSIS — N2581 Secondary hyperparathyroidism of renal origin: Secondary | ICD-10-CM | POA: Diagnosis not present

## 2021-04-27 DIAGNOSIS — N186 End stage renal disease: Secondary | ICD-10-CM | POA: Diagnosis not present

## 2021-04-29 DIAGNOSIS — E1129 Type 2 diabetes mellitus with other diabetic kidney complication: Secondary | ICD-10-CM | POA: Diagnosis not present

## 2021-04-29 DIAGNOSIS — N186 End stage renal disease: Secondary | ICD-10-CM | POA: Diagnosis not present

## 2021-04-29 DIAGNOSIS — Z992 Dependence on renal dialysis: Secondary | ICD-10-CM | POA: Diagnosis not present

## 2021-04-29 DIAGNOSIS — N2581 Secondary hyperparathyroidism of renal origin: Secondary | ICD-10-CM | POA: Diagnosis not present

## 2021-05-01 DIAGNOSIS — Z992 Dependence on renal dialysis: Secondary | ICD-10-CM | POA: Diagnosis not present

## 2021-05-01 DIAGNOSIS — N2581 Secondary hyperparathyroidism of renal origin: Secondary | ICD-10-CM | POA: Diagnosis not present

## 2021-05-01 DIAGNOSIS — E1129 Type 2 diabetes mellitus with other diabetic kidney complication: Secondary | ICD-10-CM | POA: Diagnosis not present

## 2021-05-01 DIAGNOSIS — N186 End stage renal disease: Secondary | ICD-10-CM | POA: Diagnosis not present

## 2021-05-03 DIAGNOSIS — N186 End stage renal disease: Secondary | ICD-10-CM | POA: Diagnosis not present

## 2021-05-03 DIAGNOSIS — Z992 Dependence on renal dialysis: Secondary | ICD-10-CM | POA: Diagnosis not present

## 2021-05-03 DIAGNOSIS — N2581 Secondary hyperparathyroidism of renal origin: Secondary | ICD-10-CM | POA: Diagnosis not present

## 2021-05-03 DIAGNOSIS — E1129 Type 2 diabetes mellitus with other diabetic kidney complication: Secondary | ICD-10-CM | POA: Diagnosis not present

## 2021-05-06 DIAGNOSIS — Z992 Dependence on renal dialysis: Secondary | ICD-10-CM | POA: Diagnosis not present

## 2021-05-06 DIAGNOSIS — N2581 Secondary hyperparathyroidism of renal origin: Secondary | ICD-10-CM | POA: Diagnosis not present

## 2021-05-06 DIAGNOSIS — E1129 Type 2 diabetes mellitus with other diabetic kidney complication: Secondary | ICD-10-CM | POA: Diagnosis not present

## 2021-05-06 DIAGNOSIS — N186 End stage renal disease: Secondary | ICD-10-CM | POA: Diagnosis not present

## 2021-05-07 DIAGNOSIS — I4891 Unspecified atrial fibrillation: Secondary | ICD-10-CM | POA: Diagnosis not present

## 2021-05-07 DIAGNOSIS — Z7901 Long term (current) use of anticoagulants: Secondary | ICD-10-CM | POA: Diagnosis not present

## 2021-05-07 DIAGNOSIS — D6869 Other thrombophilia: Secondary | ICD-10-CM | POA: Diagnosis not present

## 2021-05-08 DIAGNOSIS — N2581 Secondary hyperparathyroidism of renal origin: Secondary | ICD-10-CM | POA: Diagnosis not present

## 2021-05-08 DIAGNOSIS — E1129 Type 2 diabetes mellitus with other diabetic kidney complication: Secondary | ICD-10-CM | POA: Diagnosis not present

## 2021-05-08 DIAGNOSIS — Z992 Dependence on renal dialysis: Secondary | ICD-10-CM | POA: Diagnosis not present

## 2021-05-08 DIAGNOSIS — N186 End stage renal disease: Secondary | ICD-10-CM | POA: Diagnosis not present

## 2021-05-09 ENCOUNTER — Encounter: Payer: Self-pay | Admitting: Podiatry

## 2021-05-09 ENCOUNTER — Other Ambulatory Visit: Payer: Self-pay

## 2021-05-09 ENCOUNTER — Ambulatory Visit (INDEPENDENT_AMBULATORY_CARE_PROVIDER_SITE_OTHER): Payer: Medicare Other | Admitting: Podiatry

## 2021-05-09 DIAGNOSIS — I739 Peripheral vascular disease, unspecified: Secondary | ICD-10-CM | POA: Diagnosis not present

## 2021-05-09 DIAGNOSIS — Z89431 Acquired absence of right foot: Secondary | ICD-10-CM | POA: Diagnosis not present

## 2021-05-09 DIAGNOSIS — E0842 Diabetes mellitus due to underlying condition with diabetic polyneuropathy: Secondary | ICD-10-CM

## 2021-05-09 DIAGNOSIS — M79676 Pain in unspecified toe(s): Secondary | ICD-10-CM

## 2021-05-09 DIAGNOSIS — B351 Tinea unguium: Secondary | ICD-10-CM

## 2021-05-09 NOTE — Progress Notes (Signed)
Complaint:  Visit Type: Patient returns to my office for continued preventative foot care services. Complaint: Patient states" my nails have grown long and thick and become painful to walk and wear shoes on his left foot.  Patient has been diagnosed with DM with angiopathy.  Patient has had transmetatarsal amputation performed toes right foot.. The patient presents for preventative foot care services. No changes to ROS  Podiatric Exam: Vascular: dorsalis pedis and posterior tibial pulses are not  palpable left.  ... Capillary return is immediate. Temperature gradient is WNL. Skin turgor WNL  Significant swelling feet  Left  Sensorium: Normal Semmes Weinstein monofilament test. Normal tactile sensation left foot. Nail Exam: Pt has thick disfigured discolored nails with subungual debris noted  entire nail hallux through fifth toenails left foot. Ulcer Exam: There is no evidence of ulcer or pre-ulcerative changes or infection. Orthopedic Exam: Muscle tone and strength are WNL. No limitations in general ROM. No crepitus or effusions noted. Foot type and digits show no abnormalities. Bony prominences are unremarkable. TMA right foot. Skin: No Porokeratosis. No infection or ulcers.  Healing laceration anterior aspect left ankle.  Diagnosis:  Onychomycosis, ,, pain in left toes,  TMA right foot.  Treatment & Plan Procedures and Treatment: Consent by patient was obtained for treatment procedures.   Debridement of mycotic and hypertrophic toenails, 1 through 5 left foot  and clearing of subungual debris. No ulceration, no infection noted.  Return Visit-Office Procedure: Patient instructed to return to the office for a follow up visit 3 months for continued evaluation and treatment.    Gardiner Barefoot DPM

## 2021-05-10 DIAGNOSIS — E1129 Type 2 diabetes mellitus with other diabetic kidney complication: Secondary | ICD-10-CM | POA: Diagnosis not present

## 2021-05-10 DIAGNOSIS — N186 End stage renal disease: Secondary | ICD-10-CM | POA: Diagnosis not present

## 2021-05-10 DIAGNOSIS — Z992 Dependence on renal dialysis: Secondary | ICD-10-CM | POA: Diagnosis not present

## 2021-05-10 DIAGNOSIS — N2581 Secondary hyperparathyroidism of renal origin: Secondary | ICD-10-CM | POA: Diagnosis not present

## 2021-05-13 DIAGNOSIS — E1129 Type 2 diabetes mellitus with other diabetic kidney complication: Secondary | ICD-10-CM | POA: Diagnosis not present

## 2021-05-13 DIAGNOSIS — Z992 Dependence on renal dialysis: Secondary | ICD-10-CM | POA: Diagnosis not present

## 2021-05-13 DIAGNOSIS — N2581 Secondary hyperparathyroidism of renal origin: Secondary | ICD-10-CM | POA: Diagnosis not present

## 2021-05-13 DIAGNOSIS — N186 End stage renal disease: Secondary | ICD-10-CM | POA: Diagnosis not present

## 2021-05-15 DIAGNOSIS — N2581 Secondary hyperparathyroidism of renal origin: Secondary | ICD-10-CM | POA: Diagnosis not present

## 2021-05-15 DIAGNOSIS — E1129 Type 2 diabetes mellitus with other diabetic kidney complication: Secondary | ICD-10-CM | POA: Diagnosis not present

## 2021-05-15 DIAGNOSIS — N186 End stage renal disease: Secondary | ICD-10-CM | POA: Diagnosis not present

## 2021-05-15 DIAGNOSIS — Z992 Dependence on renal dialysis: Secondary | ICD-10-CM | POA: Diagnosis not present

## 2021-05-17 DIAGNOSIS — E1129 Type 2 diabetes mellitus with other diabetic kidney complication: Secondary | ICD-10-CM | POA: Diagnosis not present

## 2021-05-17 DIAGNOSIS — N186 End stage renal disease: Secondary | ICD-10-CM | POA: Diagnosis not present

## 2021-05-17 DIAGNOSIS — Z992 Dependence on renal dialysis: Secondary | ICD-10-CM | POA: Diagnosis not present

## 2021-05-17 DIAGNOSIS — N2581 Secondary hyperparathyroidism of renal origin: Secondary | ICD-10-CM | POA: Diagnosis not present

## 2021-05-20 DIAGNOSIS — E1129 Type 2 diabetes mellitus with other diabetic kidney complication: Secondary | ICD-10-CM | POA: Diagnosis not present

## 2021-05-20 DIAGNOSIS — N186 End stage renal disease: Secondary | ICD-10-CM | POA: Diagnosis not present

## 2021-05-20 DIAGNOSIS — Z992 Dependence on renal dialysis: Secondary | ICD-10-CM | POA: Diagnosis not present

## 2021-05-20 DIAGNOSIS — N2581 Secondary hyperparathyroidism of renal origin: Secondary | ICD-10-CM | POA: Diagnosis not present

## 2021-05-22 DIAGNOSIS — E1129 Type 2 diabetes mellitus with other diabetic kidney complication: Secondary | ICD-10-CM | POA: Diagnosis not present

## 2021-05-22 DIAGNOSIS — N2581 Secondary hyperparathyroidism of renal origin: Secondary | ICD-10-CM | POA: Diagnosis not present

## 2021-05-22 DIAGNOSIS — Z992 Dependence on renal dialysis: Secondary | ICD-10-CM | POA: Diagnosis not present

## 2021-05-22 DIAGNOSIS — N186 End stage renal disease: Secondary | ICD-10-CM | POA: Diagnosis not present

## 2021-05-24 DIAGNOSIS — E1129 Type 2 diabetes mellitus with other diabetic kidney complication: Secondary | ICD-10-CM | POA: Diagnosis not present

## 2021-05-24 DIAGNOSIS — N186 End stage renal disease: Secondary | ICD-10-CM | POA: Diagnosis not present

## 2021-05-24 DIAGNOSIS — N2581 Secondary hyperparathyroidism of renal origin: Secondary | ICD-10-CM | POA: Diagnosis not present

## 2021-05-24 DIAGNOSIS — Z992 Dependence on renal dialysis: Secondary | ICD-10-CM | POA: Diagnosis not present

## 2021-05-27 DIAGNOSIS — Z992 Dependence on renal dialysis: Secondary | ICD-10-CM | POA: Diagnosis not present

## 2021-05-27 DIAGNOSIS — N186 End stage renal disease: Secondary | ICD-10-CM | POA: Diagnosis not present

## 2021-05-27 DIAGNOSIS — E1129 Type 2 diabetes mellitus with other diabetic kidney complication: Secondary | ICD-10-CM | POA: Diagnosis not present

## 2021-05-27 DIAGNOSIS — N2581 Secondary hyperparathyroidism of renal origin: Secondary | ICD-10-CM | POA: Diagnosis not present

## 2021-05-29 DIAGNOSIS — N2581 Secondary hyperparathyroidism of renal origin: Secondary | ICD-10-CM | POA: Diagnosis not present

## 2021-05-29 DIAGNOSIS — E1129 Type 2 diabetes mellitus with other diabetic kidney complication: Secondary | ICD-10-CM | POA: Diagnosis not present

## 2021-05-29 DIAGNOSIS — N186 End stage renal disease: Secondary | ICD-10-CM | POA: Diagnosis not present

## 2021-05-29 DIAGNOSIS — Z992 Dependence on renal dialysis: Secondary | ICD-10-CM | POA: Diagnosis not present

## 2021-05-31 DIAGNOSIS — N186 End stage renal disease: Secondary | ICD-10-CM | POA: Diagnosis not present

## 2021-05-31 DIAGNOSIS — N2581 Secondary hyperparathyroidism of renal origin: Secondary | ICD-10-CM | POA: Diagnosis not present

## 2021-05-31 DIAGNOSIS — E1129 Type 2 diabetes mellitus with other diabetic kidney complication: Secondary | ICD-10-CM | POA: Diagnosis not present

## 2021-05-31 DIAGNOSIS — Z992 Dependence on renal dialysis: Secondary | ICD-10-CM | POA: Diagnosis not present

## 2021-06-01 DIAGNOSIS — E1129 Type 2 diabetes mellitus with other diabetic kidney complication: Secondary | ICD-10-CM | POA: Diagnosis not present

## 2021-06-01 DIAGNOSIS — N186 End stage renal disease: Secondary | ICD-10-CM | POA: Diagnosis not present

## 2021-06-01 DIAGNOSIS — Z992 Dependence on renal dialysis: Secondary | ICD-10-CM | POA: Diagnosis not present

## 2021-06-03 DIAGNOSIS — N2581 Secondary hyperparathyroidism of renal origin: Secondary | ICD-10-CM | POA: Diagnosis not present

## 2021-06-03 DIAGNOSIS — Z992 Dependence on renal dialysis: Secondary | ICD-10-CM | POA: Diagnosis not present

## 2021-06-03 DIAGNOSIS — E1129 Type 2 diabetes mellitus with other diabetic kidney complication: Secondary | ICD-10-CM | POA: Diagnosis not present

## 2021-06-03 DIAGNOSIS — N186 End stage renal disease: Secondary | ICD-10-CM | POA: Diagnosis not present

## 2021-06-03 DIAGNOSIS — D631 Anemia in chronic kidney disease: Secondary | ICD-10-CM | POA: Diagnosis not present

## 2021-06-05 DIAGNOSIS — N186 End stage renal disease: Secondary | ICD-10-CM | POA: Diagnosis not present

## 2021-06-05 DIAGNOSIS — D631 Anemia in chronic kidney disease: Secondary | ICD-10-CM | POA: Diagnosis not present

## 2021-06-05 DIAGNOSIS — Z992 Dependence on renal dialysis: Secondary | ICD-10-CM | POA: Diagnosis not present

## 2021-06-05 DIAGNOSIS — E1129 Type 2 diabetes mellitus with other diabetic kidney complication: Secondary | ICD-10-CM | POA: Diagnosis not present

## 2021-06-05 DIAGNOSIS — N2581 Secondary hyperparathyroidism of renal origin: Secondary | ICD-10-CM | POA: Diagnosis not present

## 2021-06-07 DIAGNOSIS — D631 Anemia in chronic kidney disease: Secondary | ICD-10-CM | POA: Diagnosis not present

## 2021-06-07 DIAGNOSIS — N2581 Secondary hyperparathyroidism of renal origin: Secondary | ICD-10-CM | POA: Diagnosis not present

## 2021-06-07 DIAGNOSIS — N186 End stage renal disease: Secondary | ICD-10-CM | POA: Diagnosis not present

## 2021-06-07 DIAGNOSIS — E1129 Type 2 diabetes mellitus with other diabetic kidney complication: Secondary | ICD-10-CM | POA: Diagnosis not present

## 2021-06-07 DIAGNOSIS — Z992 Dependence on renal dialysis: Secondary | ICD-10-CM | POA: Diagnosis not present

## 2021-06-10 DIAGNOSIS — Z992 Dependence on renal dialysis: Secondary | ICD-10-CM | POA: Diagnosis not present

## 2021-06-10 DIAGNOSIS — N186 End stage renal disease: Secondary | ICD-10-CM | POA: Diagnosis not present

## 2021-06-10 DIAGNOSIS — D631 Anemia in chronic kidney disease: Secondary | ICD-10-CM | POA: Diagnosis not present

## 2021-06-10 DIAGNOSIS — N2581 Secondary hyperparathyroidism of renal origin: Secondary | ICD-10-CM | POA: Diagnosis not present

## 2021-06-10 DIAGNOSIS — E1129 Type 2 diabetes mellitus with other diabetic kidney complication: Secondary | ICD-10-CM | POA: Diagnosis not present

## 2021-06-11 DIAGNOSIS — D6869 Other thrombophilia: Secondary | ICD-10-CM | POA: Diagnosis not present

## 2021-06-11 DIAGNOSIS — Z7901 Long term (current) use of anticoagulants: Secondary | ICD-10-CM | POA: Diagnosis not present

## 2021-06-11 DIAGNOSIS — I4891 Unspecified atrial fibrillation: Secondary | ICD-10-CM | POA: Diagnosis not present

## 2021-06-12 DIAGNOSIS — Z992 Dependence on renal dialysis: Secondary | ICD-10-CM | POA: Diagnosis not present

## 2021-06-12 DIAGNOSIS — N186 End stage renal disease: Secondary | ICD-10-CM | POA: Diagnosis not present

## 2021-06-12 DIAGNOSIS — N2581 Secondary hyperparathyroidism of renal origin: Secondary | ICD-10-CM | POA: Diagnosis not present

## 2021-06-12 DIAGNOSIS — E1129 Type 2 diabetes mellitus with other diabetic kidney complication: Secondary | ICD-10-CM | POA: Diagnosis not present

## 2021-06-12 DIAGNOSIS — D631 Anemia in chronic kidney disease: Secondary | ICD-10-CM | POA: Diagnosis not present

## 2021-06-14 DIAGNOSIS — N186 End stage renal disease: Secondary | ICD-10-CM | POA: Diagnosis not present

## 2021-06-14 DIAGNOSIS — E1129 Type 2 diabetes mellitus with other diabetic kidney complication: Secondary | ICD-10-CM | POA: Diagnosis not present

## 2021-06-14 DIAGNOSIS — N2581 Secondary hyperparathyroidism of renal origin: Secondary | ICD-10-CM | POA: Diagnosis not present

## 2021-06-14 DIAGNOSIS — Z992 Dependence on renal dialysis: Secondary | ICD-10-CM | POA: Diagnosis not present

## 2021-06-14 DIAGNOSIS — D631 Anemia in chronic kidney disease: Secondary | ICD-10-CM | POA: Diagnosis not present

## 2021-06-17 DIAGNOSIS — D631 Anemia in chronic kidney disease: Secondary | ICD-10-CM | POA: Diagnosis not present

## 2021-06-17 DIAGNOSIS — N186 End stage renal disease: Secondary | ICD-10-CM | POA: Diagnosis not present

## 2021-06-17 DIAGNOSIS — E1129 Type 2 diabetes mellitus with other diabetic kidney complication: Secondary | ICD-10-CM | POA: Diagnosis not present

## 2021-06-17 DIAGNOSIS — Z992 Dependence on renal dialysis: Secondary | ICD-10-CM | POA: Diagnosis not present

## 2021-06-17 DIAGNOSIS — N2581 Secondary hyperparathyroidism of renal origin: Secondary | ICD-10-CM | POA: Diagnosis not present

## 2021-06-19 DIAGNOSIS — N2581 Secondary hyperparathyroidism of renal origin: Secondary | ICD-10-CM | POA: Diagnosis not present

## 2021-06-19 DIAGNOSIS — E039 Hypothyroidism, unspecified: Secondary | ICD-10-CM | POA: Diagnosis not present

## 2021-06-19 DIAGNOSIS — E1129 Type 2 diabetes mellitus with other diabetic kidney complication: Secondary | ICD-10-CM | POA: Diagnosis not present

## 2021-06-19 DIAGNOSIS — Z992 Dependence on renal dialysis: Secondary | ICD-10-CM | POA: Diagnosis not present

## 2021-06-19 DIAGNOSIS — N186 End stage renal disease: Secondary | ICD-10-CM | POA: Diagnosis not present

## 2021-06-19 DIAGNOSIS — D631 Anemia in chronic kidney disease: Secondary | ICD-10-CM | POA: Diagnosis not present

## 2021-06-21 DIAGNOSIS — Z992 Dependence on renal dialysis: Secondary | ICD-10-CM | POA: Diagnosis not present

## 2021-06-21 DIAGNOSIS — N186 End stage renal disease: Secondary | ICD-10-CM | POA: Diagnosis not present

## 2021-06-21 DIAGNOSIS — D631 Anemia in chronic kidney disease: Secondary | ICD-10-CM | POA: Diagnosis not present

## 2021-06-21 DIAGNOSIS — E1129 Type 2 diabetes mellitus with other diabetic kidney complication: Secondary | ICD-10-CM | POA: Diagnosis not present

## 2021-06-21 DIAGNOSIS — N2581 Secondary hyperparathyroidism of renal origin: Secondary | ICD-10-CM | POA: Diagnosis not present

## 2021-06-24 DIAGNOSIS — N2581 Secondary hyperparathyroidism of renal origin: Secondary | ICD-10-CM | POA: Diagnosis not present

## 2021-06-24 DIAGNOSIS — D631 Anemia in chronic kidney disease: Secondary | ICD-10-CM | POA: Diagnosis not present

## 2021-06-24 DIAGNOSIS — N186 End stage renal disease: Secondary | ICD-10-CM | POA: Diagnosis not present

## 2021-06-24 DIAGNOSIS — Z992 Dependence on renal dialysis: Secondary | ICD-10-CM | POA: Diagnosis not present

## 2021-06-24 DIAGNOSIS — E1129 Type 2 diabetes mellitus with other diabetic kidney complication: Secondary | ICD-10-CM | POA: Diagnosis not present

## 2021-06-26 DIAGNOSIS — E1129 Type 2 diabetes mellitus with other diabetic kidney complication: Secondary | ICD-10-CM | POA: Diagnosis not present

## 2021-06-26 DIAGNOSIS — N186 End stage renal disease: Secondary | ICD-10-CM | POA: Diagnosis not present

## 2021-06-26 DIAGNOSIS — Z992 Dependence on renal dialysis: Secondary | ICD-10-CM | POA: Diagnosis not present

## 2021-06-26 DIAGNOSIS — N2581 Secondary hyperparathyroidism of renal origin: Secondary | ICD-10-CM | POA: Diagnosis not present

## 2021-06-26 DIAGNOSIS — D631 Anemia in chronic kidney disease: Secondary | ICD-10-CM | POA: Diagnosis not present

## 2021-06-27 ENCOUNTER — Encounter (HOSPITAL_COMMUNITY): Payer: Self-pay

## 2021-06-27 ENCOUNTER — Other Ambulatory Visit: Payer: Self-pay

## 2021-06-27 ENCOUNTER — Inpatient Hospital Stay (HOSPITAL_COMMUNITY)
Admission: EM | Admit: 2021-06-27 | Discharge: 2021-07-01 | DRG: 871 | Disposition: A | Discharge status: Home / Self Care | Payer: Medicare Other | Attending: Internal Medicine | Admitting: Internal Medicine

## 2021-06-27 ENCOUNTER — Emergency Department (HOSPITAL_COMMUNITY): Payer: Medicare Other

## 2021-06-27 DIAGNOSIS — I4892 Unspecified atrial flutter: Secondary | ICD-10-CM | POA: Diagnosis present

## 2021-06-27 DIAGNOSIS — Z89421 Acquired absence of other right toe(s): Secondary | ICD-10-CM

## 2021-06-27 DIAGNOSIS — D509 Iron deficiency anemia, unspecified: Secondary | ICD-10-CM | POA: Diagnosis present

## 2021-06-27 DIAGNOSIS — I959 Hypotension, unspecified: Secondary | ICD-10-CM | POA: Diagnosis present

## 2021-06-27 DIAGNOSIS — E785 Hyperlipidemia, unspecified: Secondary | ICD-10-CM | POA: Diagnosis present

## 2021-06-27 DIAGNOSIS — I132 Hypertensive heart and chronic kidney disease with heart failure and with stage 5 chronic kidney disease, or end stage renal disease: Secondary | ICD-10-CM | POA: Diagnosis present

## 2021-06-27 DIAGNOSIS — Z992 Dependence on renal dialysis: Secondary | ICD-10-CM | POA: Diagnosis not present

## 2021-06-27 DIAGNOSIS — Z833 Family history of diabetes mellitus: Secondary | ICD-10-CM

## 2021-06-27 DIAGNOSIS — Z794 Long term (current) use of insulin: Secondary | ICD-10-CM

## 2021-06-27 DIAGNOSIS — D696 Thrombocytopenia, unspecified: Secondary | ICD-10-CM | POA: Diagnosis present

## 2021-06-27 DIAGNOSIS — A046 Enteritis due to Yersinia enterocolitica: Secondary | ICD-10-CM | POA: Diagnosis not present

## 2021-06-27 DIAGNOSIS — N186 End stage renal disease: Secondary | ICD-10-CM | POA: Diagnosis present

## 2021-06-27 DIAGNOSIS — R9431 Abnormal electrocardiogram [ECG] [EKG]: Secondary | ICD-10-CM

## 2021-06-27 DIAGNOSIS — I2510 Atherosclerotic heart disease of native coronary artery without angina pectoris: Secondary | ICD-10-CM | POA: Diagnosis not present

## 2021-06-27 DIAGNOSIS — I509 Heart failure, unspecified: Secondary | ICD-10-CM | POA: Diagnosis not present

## 2021-06-27 DIAGNOSIS — R6521 Severe sepsis with septic shock: Secondary | ICD-10-CM | POA: Diagnosis not present

## 2021-06-27 DIAGNOSIS — I428 Other cardiomyopathies: Secondary | ICD-10-CM | POA: Diagnosis present

## 2021-06-27 DIAGNOSIS — R0600 Dyspnea, unspecified: Secondary | ICD-10-CM | POA: Diagnosis not present

## 2021-06-27 DIAGNOSIS — E1122 Type 2 diabetes mellitus with diabetic chronic kidney disease: Secondary | ICD-10-CM | POA: Diagnosis present

## 2021-06-27 DIAGNOSIS — R7989 Other specified abnormal findings of blood chemistry: Secondary | ICD-10-CM

## 2021-06-27 DIAGNOSIS — K219 Gastro-esophageal reflux disease without esophagitis: Secondary | ICD-10-CM | POA: Diagnosis not present

## 2021-06-27 DIAGNOSIS — Z8572 Personal history of non-Hodgkin lymphomas: Secondary | ICD-10-CM

## 2021-06-27 DIAGNOSIS — E1165 Type 2 diabetes mellitus with hyperglycemia: Secondary | ICD-10-CM

## 2021-06-27 DIAGNOSIS — I4891 Unspecified atrial fibrillation: Secondary | ICD-10-CM | POA: Diagnosis present

## 2021-06-27 DIAGNOSIS — D631 Anemia in chronic kidney disease: Secondary | ICD-10-CM | POA: Diagnosis not present

## 2021-06-27 DIAGNOSIS — Z86718 Personal history of other venous thrombosis and embolism: Secondary | ICD-10-CM

## 2021-06-27 DIAGNOSIS — D638 Anemia in other chronic diseases classified elsewhere: Secondary | ICD-10-CM | POA: Diagnosis not present

## 2021-06-27 DIAGNOSIS — I5042 Chronic combined systolic (congestive) and diastolic (congestive) heart failure: Secondary | ICD-10-CM | POA: Diagnosis not present

## 2021-06-27 DIAGNOSIS — G4730 Sleep apnea, unspecified: Secondary | ICD-10-CM | POA: Diagnosis present

## 2021-06-27 DIAGNOSIS — R748 Abnormal levels of other serum enzymes: Secondary | ICD-10-CM

## 2021-06-27 DIAGNOSIS — Z9049 Acquired absence of other specified parts of digestive tract: Secondary | ICD-10-CM

## 2021-06-27 DIAGNOSIS — R0602 Shortness of breath: Secondary | ICD-10-CM

## 2021-06-27 DIAGNOSIS — Z79899 Other long term (current) drug therapy: Secondary | ICD-10-CM

## 2021-06-27 DIAGNOSIS — E039 Hypothyroidism, unspecified: Secondary | ICD-10-CM | POA: Diagnosis present

## 2021-06-27 DIAGNOSIS — I248 Other forms of acute ischemic heart disease: Secondary | ICD-10-CM | POA: Diagnosis present

## 2021-06-27 DIAGNOSIS — Y95 Nosocomial condition: Secondary | ICD-10-CM | POA: Diagnosis present

## 2021-06-27 DIAGNOSIS — R197 Diarrhea, unspecified: Secondary | ICD-10-CM | POA: Diagnosis not present

## 2021-06-27 DIAGNOSIS — J189 Pneumonia, unspecified organism: Secondary | ICD-10-CM | POA: Diagnosis present

## 2021-06-27 DIAGNOSIS — K439 Ventral hernia without obstruction or gangrene: Secondary | ICD-10-CM | POA: Diagnosis not present

## 2021-06-27 DIAGNOSIS — Z20822 Contact with and (suspected) exposure to covid-19: Secondary | ICD-10-CM | POA: Diagnosis present

## 2021-06-27 DIAGNOSIS — R109 Unspecified abdominal pain: Secondary | ICD-10-CM | POA: Diagnosis not present

## 2021-06-27 DIAGNOSIS — E860 Dehydration: Secondary | ICD-10-CM | POA: Diagnosis not present

## 2021-06-27 DIAGNOSIS — M898X9 Other specified disorders of bone, unspecified site: Secondary | ICD-10-CM | POA: Diagnosis present

## 2021-06-27 DIAGNOSIS — D539 Nutritional anemia, unspecified: Secondary | ICD-10-CM | POA: Diagnosis present

## 2021-06-27 DIAGNOSIS — Z95828 Presence of other vascular implants and grafts: Secondary | ICD-10-CM

## 2021-06-27 DIAGNOSIS — R791 Abnormal coagulation profile: Secondary | ICD-10-CM | POA: Diagnosis present

## 2021-06-27 DIAGNOSIS — Z7901 Long term (current) use of anticoagulants: Secondary | ICD-10-CM

## 2021-06-27 DIAGNOSIS — J181 Lobar pneumonia, unspecified organism: Secondary | ICD-10-CM | POA: Diagnosis not present

## 2021-06-27 DIAGNOSIS — R778 Other specified abnormalities of plasma proteins: Secondary | ICD-10-CM | POA: Diagnosis not present

## 2021-06-27 DIAGNOSIS — Z86010 Personal history of colonic polyps: Secondary | ICD-10-CM

## 2021-06-27 DIAGNOSIS — I4510 Unspecified right bundle-branch block: Secondary | ICD-10-CM | POA: Diagnosis present

## 2021-06-27 DIAGNOSIS — E872 Acidosis, unspecified: Secondary | ICD-10-CM | POA: Diagnosis not present

## 2021-06-27 DIAGNOSIS — Z8249 Family history of ischemic heart disease and other diseases of the circulatory system: Secondary | ICD-10-CM

## 2021-06-27 DIAGNOSIS — I5031 Acute diastolic (congestive) heart failure: Secondary | ICD-10-CM | POA: Diagnosis not present

## 2021-06-27 DIAGNOSIS — J440 Chronic obstructive pulmonary disease with acute lower respiratory infection: Secondary | ICD-10-CM | POA: Diagnosis present

## 2021-06-27 DIAGNOSIS — A419 Sepsis, unspecified organism: Principal | ICD-10-CM | POA: Diagnosis present

## 2021-06-27 DIAGNOSIS — Z85828 Personal history of other malignant neoplasm of skin: Secondary | ICD-10-CM

## 2021-06-27 DIAGNOSIS — R1110 Vomiting, unspecified: Secondary | ICD-10-CM | POA: Diagnosis not present

## 2021-06-27 DIAGNOSIS — J9811 Atelectasis: Secondary | ICD-10-CM | POA: Diagnosis not present

## 2021-06-27 DIAGNOSIS — I700 Atherosclerosis of aorta: Secondary | ICD-10-CM | POA: Diagnosis not present

## 2021-06-27 DIAGNOSIS — N250 Renal osteodystrophy: Secondary | ICD-10-CM | POA: Diagnosis not present

## 2021-06-27 LAB — CBC
HCT: 43.9 % (ref 39.0–52.0)
Hemoglobin: 13.8 g/dL (ref 13.0–17.0)
MCH: 32.5 pg (ref 26.0–34.0)
MCHC: 31.4 g/dL (ref 30.0–36.0)
MCV: 103.5 fL — ABNORMAL HIGH (ref 80.0–100.0)
Platelets: 122 10*3/uL — ABNORMAL LOW (ref 150–400)
RBC: 4.24 MIL/uL (ref 4.22–5.81)
RDW: 16.3 % — ABNORMAL HIGH (ref 11.5–15.5)
WBC: 6.8 10*3/uL (ref 4.0–10.5)
nRBC: 0 % (ref 0.0–0.2)

## 2021-06-27 LAB — COMPREHENSIVE METABOLIC PANEL
ALT: 41 U/L (ref 0–44)
AST: 43 U/L — ABNORMAL HIGH (ref 15–41)
Albumin: 2.8 g/dL — ABNORMAL LOW (ref 3.5–5.0)
Alkaline Phosphatase: 115 U/L (ref 38–126)
Anion gap: 19 — ABNORMAL HIGH (ref 5–15)
BUN: 46 mg/dL — ABNORMAL HIGH (ref 8–23)
CO2: 22 mmol/L (ref 22–32)
Calcium: 8.3 mg/dL — ABNORMAL LOW (ref 8.9–10.3)
Chloride: 97 mmol/L — ABNORMAL LOW (ref 98–111)
Creatinine, Ser: 7.28 mg/dL — ABNORMAL HIGH (ref 0.61–1.24)
GFR, Estimated: 8 mL/min — ABNORMAL LOW (ref >60)
Glucose, Bld: 234 mg/dL — ABNORMAL HIGH (ref 70–99)
Potassium: 3.9 mmol/L (ref 3.5–5.1)
Sodium: 138 mmol/L (ref 135–145)
Total Bilirubin: 0.7 mg/dL (ref 0.3–1.2)
Total Protein: 5.5 g/dL — ABNORMAL LOW (ref 6.5–8.1)

## 2021-06-27 LAB — RESP PANEL BY RT-PCR (FLU A&B, COVID) ARPGX2
Influenza A by PCR: NEGATIVE
Influenza B by PCR: NEGATIVE
SARS Coronavirus 2 by RT PCR: NEGATIVE

## 2021-06-27 LAB — BRAIN NATRIURETIC PEPTIDE: B Natriuretic Peptide: 411 pg/mL — ABNORMAL HIGH (ref 0.0–100.0)

## 2021-06-27 LAB — CBG MONITORING, ED: Glucose-Capillary: 231 mg/dL — ABNORMAL HIGH (ref 70–99)

## 2021-06-27 LAB — TROPONIN I (HIGH SENSITIVITY): Troponin I (High Sensitivity): 42 ng/L — ABNORMAL HIGH (ref <18)

## 2021-06-27 LAB — LIPASE, BLOOD: Lipase: 79 U/L — ABNORMAL HIGH (ref 11–51)

## 2021-06-27 LAB — PROTIME-INR
INR: 3.7 — ABNORMAL HIGH (ref 0.8–1.2)
Prothrombin Time: 36.9 seconds — ABNORMAL HIGH (ref 11.4–15.2)

## 2021-06-27 MED ORDER — ONDANSETRON HCL 4 MG/2ML IJ SOLN
4.0000 mg | Freq: Once | INTRAMUSCULAR | Status: AC
  Administered 2021-06-27: 4 mg via INTRAVENOUS
  Filled 2021-06-27: qty 2

## 2021-06-27 MED ORDER — LACTATED RINGERS IV BOLUS
1000.0000 mL | Freq: Once | INTRAVENOUS | Status: AC
  Administered 2021-06-27: 1000 mL via INTRAVENOUS

## 2021-06-27 MED ORDER — MIDODRINE HCL 5 MG PO TABS
10.0000 mg | ORAL_TABLET | Freq: Three times a day (TID) | ORAL | Status: DC
  Administered 2021-06-27 – 2021-06-30 (×10): 10 mg via ORAL
  Filled 2021-06-27 (×10): qty 2

## 2021-06-27 NOTE — ED Provider Notes (Signed)
[facility] Provider Note  CSN: [id number] Arrival date & time: 06/27/21 1918  Chief Complaint(s) Shortness of Breath  HPI [name] is a 71 y.o. male with PMH ESRD on dialysis Tuesday Thursday Saturday, persistent a flutter on warfarin, COPD who presents emergency department for evaluation of shortness of breath and diarrhea.  History obtained from both the patient and his brother who states that over the last 48 hours the patient has had persistent diarrhea and progressively worsening shortness of breath.  He has been compliant with dialysis and received dialysis yesterday.  Patient arrives complaining of worsening shortness of breath and mild generalized abdominal pain.  He arrives hypotensive in the 70s and tachycardic.  Denies chest pain, headache, fever or other systemic symptoms.   Shortness of Breath Associated symptoms: abdominal pain    Past Medical History Past Medical History:  Diagnosis Date   Anemia    Arthritis    HNP- lumbar, "all over my body"   Atrial fibrillation and flutter ([place])    Blood transfusion    "years ago; blood was low" (08/05/2013)   COPD (chronic obstructive pulmonary disease) (HCC)    Diabetic nephropathy (HCC)    Diabetic retinopathy    DVT (deep venous thrombosis) ([place])    "got one in my right leg now; I've had one before too, not sure which leg" (08/05/2013)   ESRD (end stage renal disease) on dialysis ([place])    TTS, [place]   Family history of anesthesia complication    " my son wakes up slowly"   GERD (gastroesophageal reflux disease)    uses alka seltzere on occas.    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 unsure of several answers to health history questions    Septic shock ([place])    Skin cancer    melanoma - head   Sleep apnea    "suppose to have a sleep study, but they never told me when. (08/05/2013)   Patient Active Problem List   Diagnosis Date Noted   Atrial fibrillation and flutter ([place])    Ulcer of sphincterotomy site post ERCP    Upper GI bleed    Blood loss anemia    Common bile duct stone    Cholelithiasis without obstruction 05/13/2020   Class 1 obesity due to excess calories with body mass index (BMI) of 33.0 to 33.9 in adult 05/13/2020   Hypothyroidism, unspecified 03/25/2020   Allergy, unspecified, initial encounter 12/22/2019   Anaphylactic shock, unspecified, initial encounter 12/22/2019   Procedure and treatment not carried out, unspecified reason 03/12/2019   Anaphylactic reaction due to adverse effect of correct drug or medicament properly administered, initial encounter 02/15/2019   Malnutrition of moderate degree 73/71/0626   Toxic metabolic encephalopathy 94/85/4627   Elevated LFTs    Idiopathic acute pancreatitis without infection or necrosis    Acute respiratory failure with hypoxia (HCC)    Cholecystitis    Metabolic acidosis    Pericardial effusion with cardiac tamponade    Hypotension 01/09/2018   Skin cancer of scalp  or skin of neck 11/11/2016   Abnormal nuclear stress test 09/98/3382   Systolic heart failure (HCC)    Other abnormalities of gait and mobility 07/10/2016   Arteriovenous fistula, acquired ([place]) 07/08/2016   Dependence on other enabling machines and devices 05/01/2016   Diarrhea, unspecified 04/25/2016   Partial traumatic amputation of right foot, level unspecified, initial encounter ([place]) 04/16/2016   Other disorders resulting from impaired renal tubular function 04/06/2016   Headache, unspecified 03/17/2016   Other urticaria 03/17/2016   Calcaneal spur, right  foot 03/13/2016   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, initial encounter ([place]) 03/13/2016   Generalized edema 03/10/2016   Localized enlarged lymph nodes 03/10/2016   Hardening of the aorta (main artery of the heart) ([place]) 03/05/2016   History of colonic polyps    Other disorders of calcium metabolism 10/03/2013   Iron deficiency anemia, unspecified 09/23/2013   Encounter for immunization 08/26/2013   Secondary hyperparathyroidism of renal origin ([place]) 08/15/2013   Other sleep apnea 08/08/2013   Personal history of other diseases of the musculoskeletal system and connective tissue 08/08/2013   ESRD (end stage renal disease) ([place]) 01/31/2013   Chronic anticoagulation 12/27/2012   Vitreous hemorrhage ([place]) 04/19/2012   NSVT (nonsustained ventricular tachycardia) 03/18/2012   Noncompliance 03/16/2012   Chest pain, no MI, negative myoview, most likely muscular sketal pain 03/15/2012   Proliferative diabetic retinopathy associated with type 2 diabetes mellitus ([place]) 08/18/2011   Traction detachment of left retina 08/18/2011   Non Hodgkin's lymphoma ([place])    Hyperlipidemia    Diabetic nephropathy (HCC)    DVT (deep venous thrombosis) ([place])    Healthcare-associated pneumonia 01/15/2011   Anemia in chronic kidney disease 01/12/2011   History of DVT of lower extremity 01/12/2011   ABDOMINAL PAIN -GENERALIZED 10/24/2008   PERSONAL HX COLONIC POLYPS 10/24/2008   Home Medication(s) Prior to Admission medications   Medication Sig Start Date End Date Taking? Authorizing Provider  acetaminophen (TYLENOL) 500 MG tablet Take 500-1,000 mg by mouth daily as needed (back pain).    [provider]  B Complex-C-Folic Acid (RENAL MULTIVITAMIN FORMULA) TABS Take 1 tablet by mouth daily. Renal Tab II    [provider]  Cholecalciferol (VITAMIN D3) 125 MCG (5000 UT) TABS Take 5,000 Units by mouth daily.    [provider]  cinacalcet (SENSIPAR) 30 MG tablet  SMARTSIG:1 Tablet(s) By Mouth Every Evening 12/25/20   [provider]  doxercalciferol (HECTOROL) 4 MCG/2ML injection Inject 2.5 mLs (5 mcg total) into the vein Every Tuesday,Thursday,and Saturday with dialysis. 05/25/20   Nolberto Hanlon, MD  enoxaparin (LOVENOX) 120 MG/0.8ML injection Inject 120 mg into the skin every 12 (twelve) hours.    [provider]  ferrous sulfate 325 (65 FE) MG tablet Take 325 mg by mouth daily with breakfast.    [provider]  insulin aspart (NOVOLOG) 100 UNIT/ML injection Inject 1-9 Units into the skin 3 (three) times daily before meals. 01/21/18   Domenic Polite, MD  lidocaine-prilocaine (EMLA) cream Apply 1 application topically as needed (Apply small amount to access site 1-2 hours before dialysis. Cover with occlusive dressing (saran wrap)).     [provider]  Multiple Vitamins-Minerals (MULTIVITAMIN WITH MINERALS) tablet Take 1 tablet by mouth daily.    [provider]  multivitamin (RENA-VIT) TABS tablet Take 1 tablet by mouth daily. 07/16/20   [provider]  oxyCODONE-acetaminophen (PERCOCET) 5-325 MG tablet Take 1 tablet by mouth every 6 (six) hours as  needed for severe pain. 11/06/20   Rhyne, Hulen Shouts, PA-C  pantoprazole (PROTONIX) 40 MG tablet Take 1 tablet (40 mg total) by mouth 2 (two) times daily. 05/24/20 05/24/21  Nolberto Hanlon, MD  sevelamer carbonate (RENVELA) 800 MG tablet Take 800 mg by mouth 3 (three) times daily with meals.    [provider]  vitamin C (ASCORBIC ACID) 500 MG tablet Take 500 mg by mouth daily.    [provider]  warfarin (COUMADIN) 5 MG tablet Take 7.5 mg by mouth daily.    [provider]  zinc sulfate 220 (50 Zn) MG capsule Take 220 mg by mouth daily.    [provider]                                                                                                                                    Past Surgical History Past Surgical  History:  Procedure Laterality Date   ACHILLES TENDON SURGERY Right 03/13/2016   Procedure: ACHILLES LENGTHENING/KIDNER;  Surgeon: Edrick Kins, DPM;  Location: [facility];  Service: Podiatry;  Laterality: Right;   AV FISTULA PLACEMENT Left 02/03/2013   Procedure: ARTERIOVENOUS (AV) FISTULA CREATION- LEFT RADIAL CEPHALIC; ULTRASOUND GUIDED;  Surgeon: Mal Misty, MD;  Location: [facility];  Service: Vascular;  Laterality: Left;   AV FISTULA PLACEMENT Right 11/08/2015   Procedure: RIGHT BRACHIOCEPHALIC ARTERIOVENOUS (AV) FISTULA CREATION;  Surgeon: Serafina Mitchell, MD;  Location: [facility];  Service: Vascular;  Laterality: Right;   [place] Right 01/24/2016   Procedure: RIGHT SECOND STAGE BASILIC VEIN TRANSPOSITION;  Surgeon: Angelia Mould, MD;  Location: [facility];  Service: Vascular;  Laterality: Right;   BIOPSY  05/17/2020   Procedure: BIOPSY;  Surgeon: Irving Copas., MD;  Location: [facility];  Service: Gastroenterology;;   CARDIAC CATHETERIZATION     CHOLECYSTECTOMY N/A 05/18/2020   Procedure: LAPAROSCOPIC CHOLECYSTECTOMY WITH INTRAOPERATIVE CHOLANGIOGRAM;  Surgeon: [name], MD;  Location: [facility];  Service: General;  Laterality: N/A;   COLONOSCOPY N/A 09/23/2015   Procedure: COLONOSCOPY;  Surgeon: Irene Shipper, MD;  Location: [facility];  Service: Endoscopy;  Laterality: N/A;   Coloscopy     ENDOSCOPIC RETROGRADE CHOLANGIOPANCREATOGRAPHY (ERCP) WITH PROPOFOL N/A 05/17/2020   Procedure: ENDOSCOPIC RETROGRADE CHOLANGIOPANCREATOGRAPHY (ERCP) WITH PROPOFOL;  Surgeon: Rush Landmark Telford Nab., MD;  Location: [facility];  Service: Gastroenterology;  Laterality: N/A;   ESOPHAGOGASTRODUODENOSCOPY (EGD) WITH PROPOFOL N/A 05/22/2020   Procedure: ESOPHAGOGASTRODUODENOSCOPY (EGD) WITH PROPOFOL;  Surgeon: Gatha Mayer, MD;  Location: [facility];  Service: Endoscopy;  Laterality: N/A;   EYE SURGERY Bilateral    GAS INSERTION  05/10/2012   Procedure: INSERTION OF  GAS;  Surgeon: Hayden Pedro, MD;  Location: [facility];  Service: Ophthalmology;  Laterality: Right;   LEFT HEART CATH AND CORONARY ANGIOGRAPHY N/A 09/11/2016   Procedure: Left Heart Cath and Coronary Angiography;  Surgeon: Belva Crome, MD;  Location: [facility];  Service: Cardiovascular;  Laterality: N/A;   LESION EXCISION Right 05/08/2015   Procedure: EXCISION SCALP LESION;  Surgeon: Erroll Luna, MD;  Location: [facility];  Service: General;  Laterality: Right;   MEMBRANE PEEL  05/10/2012   Procedure: MEMBRANE PEEL;  Surgeon: Hayden Pedro, MD;  Location: [facility];  Service: Ophthalmology;  Laterality: Right;   PARS PLANA VITRECTOMY  08/27/2011   Procedure: PARS PLANA VITRECTOMY WITH 25 GAUGE;  Surgeon: Hayden Pedro, MD;  Location: [facility];  Service: Ophthalmology;  Laterality: Left;  Repair of complex traction retinal detachment left eye   PARS PLANA VITRECTOMY  05/10/2012   Procedure: PARS PLANA VITRECTOMY WITH 25 GAUGE;  Surgeon: Hayden Pedro, MD;  Location: [facility];  Service: Ophthalmology;  Laterality: Right;  Repair Complex Traction Retinal Detachment   PHOTOCOAGULATION WITH LASER  05/10/2012   Procedure: PHOTOCOAGULATION WITH LASER;  Surgeon: Hayden Pedro, MD;  Location: [facility];  Service: Ophthalmology;  Laterality: Right;   PORT-A-CATH REMOVAL     PORTACATH PLACEMENT     REMOVAL OF STONES  05/17/2020   Procedure: REMOVAL OF STONES;  Surgeon: Rush Landmark Telford Nab., MD;  Location: [facility];  Service: Gastroenterology;;   REVISON OF ARTERIOVENOUS FISTULA Left 07/31/2020   Procedure: REVISION AND 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;  Surgeon: Rosetta Posner, MD;  Location: [facility];  Service: Vascular;  Laterality: Left;   REVISON OF ARTERIOVENOUS FISTULA Left 11/06/2020   Procedure: EXCISION AND CLEANOUT OF LEFT ARM;  Surgeon: Rosetta Posner, MD;  Location: [facility];  Service: Vascular;  Laterality: Left;   SPHINCTEROTOMY  05/17/2020   Procedure:  Joan Mayans;  Surgeon: Mansouraty, Telford Nab., MD;  Location: [facility];  Service: Gastroenterology;;   SUBXYPHOID PERICARDIAL WINDOW N/A 01/10/2018   Procedure: SUBXYPHOID PERICARDIAL WINDOW;  Surgeon: Rexene Alberts, MD;  Location: [facility];  Service: Thoracic;  Laterality: N/A;   TRANSMETATARSAL AMPUTATION Right 03/13/2016   Procedure: TRANSMETATARSAL AMPUTATION;  Surgeon: Edrick Kins, DPM;  Location: [facility];  Service: Podiatry;  Laterality: Right;   VENA CAVA FILTER PLACEMENT  09/2012   due to preparation for surgery   Family History Family History  Problem Relation Age of Onset   Anesthesia problems Son    Hypertension Son    Diabetes Father    Hypertension Father    Other Father        amputation   Colon cancer Neg Hx     Social History Social History   Tobacco Use   Smoking status: Never   Smokeless tobacco: Never  Vaping Use   Vaping Use: Never used  Substance Use Topics   Alcohol use: No    Alcohol/week: 0.0 standard drinks   Drug use: No   Allergies Patient has no known allergies.  Review of Systems Review of Systems  Respiratory:  Positive for shortness of breath.   Gastrointestinal:  Positive for abdominal pain and diarrhea.   Physical Exam Vital Signs  I have reviewed the triage vital signs BP 93/62 (BP Location: Left Arm)    Pulse (!) 113    Temp 98.2 F (36.8 C) (Oral)    Resp 18    Ht 6\' 3"  (1.905 m)    Wt 111.1 kg    SpO2 92%    BMI 30.62 kg/m   Physical Exam Vitals and nursing note reviewed.  Constitutional:      General: He is not in acute distress.  Appearance: He is well-developed.  HENT:     Head: Normocephalic and atraumatic.  Eyes:     Conjunctiva/sclera: Conjunctivae normal.  Cardiovascular:     Rate and Rhythm: Tachycardia present. Rhythm irregular.     Heart sounds: No murmur heard. Pulmonary:     Effort: Pulmonary effort is normal. No respiratory distress.     Breath sounds: Normal breath sounds.  Abdominal:      Palpations: Abdomen is soft.     Tenderness: There is abdominal tenderness.  Musculoskeletal:        General: No swelling.     Cervical back: Neck supple.  Skin:    General: Skin is warm and dry.     Capillary Refill: Capillary refill takes less than 2 seconds.  Neurological:     Mental Status: He is alert.  Psychiatric:        Mood and Affect: Mood normal.    ED Results and Treatments Labs (all labs ordered are listed, but only abnormal results are displayed) Labs Reviewed  RESP PANEL BY RT-PCR (FLU A&B, COVID) ARPGX2  CBC  COMPREHENSIVE METABOLIC PANEL  BRAIN NATRIURETIC PEPTIDE  LIPASE, BLOOD  TROPONIN I (HIGH SENSITIVITY)                                                                                                                          Radiology DG Chest 1 View  Result Date: 06/27/2021 CLINICAL DATA:  Shortness of breath, vomiting EXAM: CHEST  1 VIEW COMPARISON:  07/16/2018 FINDINGS: There is poor inspiration. There are patchy linear densities in both lower lung fields, more so on the left side. Costophrenic angles are clear. There is no pneumothorax. There is mild extrinsic pressure over the left lateral margin of trachea in the lower neck suggesting possible enlarged thyroid. IMPRESSION: There linear patchy densities in both lower lung fields, more so on the left side. Findings suggest possible atelectasis/pneumonia. There is poor inspiration which could account for some of the crowding of markings in the lower lung fields. There is no significant pleural effusion or pneumothorax. Electronically Signed   By: Elmer Picker [name]   On: 06/27/2021 20:38    Pertinent labs & imaging results that were available during my care of the patient were reviewed by me and considered in my medical decision making (see MDM for details).  Medications Ordered in ED Medications - No data to display  Procedures .Critical Care Performed by: Teressa Lower, MD Authorized by: Teressa Lower, MD   Critical care provider statement:    Critical care time (minutes):  30   Critical care was necessary to treat or prevent imminent or life-threatening deterioration of the following conditions:  Circulatory failure   Critical care was time spent personally by me on the following activities:  Development of treatment plan with patient or surrogate, discussions with consultants, evaluation of patient's response to treatment, examination of patient, ordering and review of laboratory studies, ordering and review of radiographic studies, ordering and performing treatments and interventions, pulse oximetry, re-evaluation of patient's condition and review of old charts  (including critical care time)  Medical Decision Making / ED Course   This patient presents to the ED for concern of shortness of breath, abdominal pain and diarrhea, this involves an extensive number of treatment options, and is a complaint that carries with it a high risk of complications and morbidity.  The differential diagnosis includes dehydration, viral gastroenteritis, COVID, flu, pancreatitis, fluid overload, CHF exacerbation, COPD exacerbation  MDM: Patient seen the emergency department for evaluation of multiple complaints described above.  Physical exam reveals an ill-appearing patient with generalized abdominal tenderness to palpation, pulmonary exam unremarkable.  Laboratory evaluation with elevated BNP to 411, troponin elevated to 42, lipase elevated to 79, BUN 46, creatinine 7.28, mild AST elevation of 43, anion gap 19, INR supratherapeutic at 3.7, COVID and flu negative.  For the patient's hypotension he was given 2 L lactated Ringer's and restarted on his home midodrine which improved his blood pressures.  Initial chest x-ray with possible left-sided pneumonia.  CT chest abdomen pelvis  is currently pending.  Please see provider signout for continuation of work-up.  Anticipate admission.   Additional history obtained: -Additional history obtained from brother -External records from outside source obtained and reviewed including: Chart review including previous notes, labs, imaging, consultation notes   Lab Tests: -I ordered, reviewed, and interpreted labs.   The pertinent results include:   Labs Reviewed  RESP PANEL BY RT-PCR (FLU A&B, COVID) ARPGX2  CBC  COMPREHENSIVE METABOLIC PANEL  BRAIN NATRIURETIC PEPTIDE  LIPASE, BLOOD  TROPONIN I (HIGH SENSITIVITY)      EKG   EKG Interpretation  Date/Time:  Friday June 27 2021 20:04:44 EST Ventricular Rate:  107 PR Interval:    QRS Duration: 156 QT Interval:  418 QTC Calculation: 558 R Axis:   -81 Text Interpretation: Aflutter with 2:1 block similar to previous ECGs Left ventricular hypertrophy with repolarization abnormality ( R in aVL ) Inferior infarct (cited on or before 10-Jan-2018) Abnormal ECG When compared with ECG of 16-Jul-2018 22:52, Atrial fibrillation has replaced Atrial flutter Borderline criteria for Lateral infarct are now Present Confirmed by Amarie Viles ([id number]) on 06/28/2021 12:01:27 AM         Imaging Studies ordered: I ordered imaging studies including CXR I independently visualized and interpreted imaging. I agree with the radiologist interpretation   Medicines ordered and prescription drug management: No orders of the defined types were placed in this encounter.   -I have reviewed the patients home medicines and have made adjustments as needed  Critical interventions Multiple fluid boluses for dehydration, midodrine\  Cardiac Monitoring: The patient was maintained on a cardiac monitor.  I personally viewed and interpreted the cardiac monitored which showed an underlying rhythm of: Aflutter  Social Determinants of Health:  Factors impacting patients care include:  none   Reevaluation: After the interventions noted above, I  reevaluated the patient and found that they have :improved  Co morbidities that complicate the patient evaluation  Past Medical History:  Diagnosis Date   Anemia    Arthritis    HNP- lumbar, "all over my body"   Atrial fibrillation and flutter ([place])    Blood transfusion    "years ago; blood was low" (08/05/2013)   COPD (chronic obstructive pulmonary disease) ([place])    Diabetic nephropathy (HCC)    Diabetic retinopathy    DVT (deep venous thrombosis) ([place])    "got one in my right leg now; I've had one before too, not sure which leg" (08/05/2013)   ESRD (end stage renal disease) on dialysis ([place])    TTS, [place]   Family history of anesthesia complication    " my son wakes up slowly"   GERD (gastroesophageal reflux disease)    uses alka seltzere on occas.    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)    Tx 2009; "had chemo; it went away" (08/05/2013)   Noncompliance 03/16/2012   NSVT (nonsustained ventricular tachycardia) 03/18/2012   Peripheral vascular disease ([place])    Poor historian    pt. unsure of several answers to health history questions    Septic shock ([place])    Skin cancer    melanoma - head   Sleep apnea    "suppose to have a sleep study, but they never told me when. (08/05/2013)      Dispostion: I considered admission for this patient, and disposition will be pending CT imaging.  Anticipate admission.     Final Clinical Impression(s) / ED Diagnoses Final diagnoses:  SOB (shortness of breath)     @PCDICTATION @    Teressa Lower, MD 06/28/21 0002

## 2021-06-27 NOTE — ED Triage Notes (Signed)
Pt arrives with c/o SOB since yesterday. Pt is a HD and goes TTHS. Pt endorses nausea and vomiting. Pt a&ox4.

## 2021-06-28 ENCOUNTER — Encounter (HOSPITAL_COMMUNITY): Payer: Self-pay | Admitting: Internal Medicine

## 2021-06-28 ENCOUNTER — Other Ambulatory Visit: Payer: Self-pay

## 2021-06-28 ENCOUNTER — Emergency Department (HOSPITAL_COMMUNITY): Payer: Medicare Other

## 2021-06-28 ENCOUNTER — Inpatient Hospital Stay (HOSPITAL_COMMUNITY): Payer: Medicare Other

## 2021-06-28 DIAGNOSIS — N186 End stage renal disease: Secondary | ICD-10-CM | POA: Diagnosis present

## 2021-06-28 DIAGNOSIS — D631 Anemia in chronic kidney disease: Secondary | ICD-10-CM | POA: Diagnosis present

## 2021-06-28 DIAGNOSIS — R778 Other specified abnormalities of plasma proteins: Secondary | ICD-10-CM

## 2021-06-28 DIAGNOSIS — I4892 Unspecified atrial flutter: Secondary | ICD-10-CM | POA: Diagnosis present

## 2021-06-28 DIAGNOSIS — M898X9 Other specified disorders of bone, unspecified site: Secondary | ICD-10-CM | POA: Diagnosis present

## 2021-06-28 DIAGNOSIS — E1122 Type 2 diabetes mellitus with diabetic chronic kidney disease: Secondary | ICD-10-CM | POA: Diagnosis present

## 2021-06-28 DIAGNOSIS — E039 Hypothyroidism, unspecified: Secondary | ICD-10-CM | POA: Diagnosis present

## 2021-06-28 DIAGNOSIS — I4891 Unspecified atrial fibrillation: Secondary | ICD-10-CM | POA: Diagnosis not present

## 2021-06-28 DIAGNOSIS — Y95 Nosocomial condition: Secondary | ICD-10-CM | POA: Diagnosis present

## 2021-06-28 DIAGNOSIS — J189 Pneumonia, unspecified organism: Secondary | ICD-10-CM

## 2021-06-28 DIAGNOSIS — I5042 Chronic combined systolic (congestive) and diastolic (congestive) heart failure: Secondary | ICD-10-CM | POA: Diagnosis present

## 2021-06-28 DIAGNOSIS — E1165 Type 2 diabetes mellitus with hyperglycemia: Secondary | ICD-10-CM

## 2021-06-28 DIAGNOSIS — R0602 Shortness of breath: Secondary | ICD-10-CM | POA: Diagnosis present

## 2021-06-28 DIAGNOSIS — R7989 Other specified abnormal findings of blood chemistry: Secondary | ICD-10-CM | POA: Diagnosis not present

## 2021-06-28 DIAGNOSIS — R748 Abnormal levels of other serum enzymes: Secondary | ICD-10-CM | POA: Diagnosis not present

## 2021-06-28 DIAGNOSIS — I509 Heart failure, unspecified: Secondary | ICD-10-CM | POA: Diagnosis not present

## 2021-06-28 DIAGNOSIS — A419 Sepsis, unspecified organism: Secondary | ICD-10-CM | POA: Diagnosis present

## 2021-06-28 DIAGNOSIS — I428 Other cardiomyopathies: Secondary | ICD-10-CM | POA: Diagnosis present

## 2021-06-28 DIAGNOSIS — R197 Diarrhea, unspecified: Secondary | ICD-10-CM | POA: Diagnosis not present

## 2021-06-28 DIAGNOSIS — I132 Hypertensive heart and chronic kidney disease with heart failure and with stage 5 chronic kidney disease, or end stage renal disease: Secondary | ICD-10-CM | POA: Diagnosis present

## 2021-06-28 DIAGNOSIS — R6521 Severe sepsis with septic shock: Secondary | ICD-10-CM | POA: Diagnosis present

## 2021-06-28 DIAGNOSIS — E872 Acidosis, unspecified: Secondary | ICD-10-CM

## 2021-06-28 DIAGNOSIS — J9811 Atelectasis: Secondary | ICD-10-CM | POA: Diagnosis not present

## 2021-06-28 DIAGNOSIS — E860 Dehydration: Secondary | ICD-10-CM | POA: Diagnosis present

## 2021-06-28 DIAGNOSIS — D509 Iron deficiency anemia, unspecified: Secondary | ICD-10-CM | POA: Diagnosis present

## 2021-06-28 DIAGNOSIS — D539 Nutritional anemia, unspecified: Secondary | ICD-10-CM

## 2021-06-28 DIAGNOSIS — Z992 Dependence on renal dialysis: Secondary | ICD-10-CM

## 2021-06-28 DIAGNOSIS — R0600 Dyspnea, unspecified: Secondary | ICD-10-CM | POA: Diagnosis not present

## 2021-06-28 DIAGNOSIS — J440 Chronic obstructive pulmonary disease with acute lower respiratory infection: Secondary | ICD-10-CM | POA: Diagnosis present

## 2021-06-28 DIAGNOSIS — I700 Atherosclerosis of aorta: Secondary | ICD-10-CM | POA: Diagnosis not present

## 2021-06-28 DIAGNOSIS — Z20822 Contact with and (suspected) exposure to covid-19: Secondary | ICD-10-CM | POA: Diagnosis present

## 2021-06-28 DIAGNOSIS — K439 Ventral hernia without obstruction or gangrene: Secondary | ICD-10-CM | POA: Diagnosis not present

## 2021-06-28 DIAGNOSIS — A046 Enteritis due to Yersinia enterocolitica: Secondary | ICD-10-CM | POA: Diagnosis not present

## 2021-06-28 DIAGNOSIS — I4510 Unspecified right bundle-branch block: Secondary | ICD-10-CM | POA: Diagnosis present

## 2021-06-28 DIAGNOSIS — K219 Gastro-esophageal reflux disease without esophagitis: Secondary | ICD-10-CM

## 2021-06-28 DIAGNOSIS — I5031 Acute diastolic (congestive) heart failure: Secondary | ICD-10-CM

## 2021-06-28 DIAGNOSIS — D696 Thrombocytopenia, unspecified: Secondary | ICD-10-CM | POA: Diagnosis present

## 2021-06-28 DIAGNOSIS — Z9049 Acquired absence of other specified parts of digestive tract: Secondary | ICD-10-CM | POA: Diagnosis not present

## 2021-06-28 DIAGNOSIS — J181 Lobar pneumonia, unspecified organism: Secondary | ICD-10-CM | POA: Diagnosis present

## 2021-06-28 DIAGNOSIS — D638 Anemia in other chronic diseases classified elsewhere: Secondary | ICD-10-CM

## 2021-06-28 DIAGNOSIS — I248 Other forms of acute ischemic heart disease: Secondary | ICD-10-CM | POA: Diagnosis present

## 2021-06-28 DIAGNOSIS — N250 Renal osteodystrophy: Secondary | ICD-10-CM | POA: Diagnosis not present

## 2021-06-28 DIAGNOSIS — E785 Hyperlipidemia, unspecified: Secondary | ICD-10-CM | POA: Diagnosis present

## 2021-06-28 DIAGNOSIS — I2510 Atherosclerotic heart disease of native coronary artery without angina pectoris: Secondary | ICD-10-CM | POA: Diagnosis not present

## 2021-06-28 DIAGNOSIS — G4730 Sleep apnea, unspecified: Secondary | ICD-10-CM | POA: Diagnosis present

## 2021-06-28 DIAGNOSIS — R109 Unspecified abdominal pain: Secondary | ICD-10-CM | POA: Diagnosis not present

## 2021-06-28 DIAGNOSIS — I959 Hypotension, unspecified: Secondary | ICD-10-CM

## 2021-06-28 DIAGNOSIS — R9431 Abnormal electrocardiogram [ECG] [EKG]: Secondary | ICD-10-CM

## 2021-06-28 DIAGNOSIS — R791 Abnormal coagulation profile: Secondary | ICD-10-CM

## 2021-06-28 LAB — ECHOCARDIOGRAM COMPLETE
AR max vel: 2.69 cm2
AV Area VTI: 2.8 cm2
AV Area mean vel: 2.62 cm2
AV Mean grad: 4.4 mmHg
AV Peak grad: 9 mmHg
Ao pk vel: 1.5 m/s
Calc EF: 55.3 %
Height: 75 in
S' Lateral: 2.9 cm
Single Plane A2C EF: 58.7 %
Single Plane A4C EF: 55.7 %
Weight: 4024.72 oz

## 2021-06-28 LAB — COMPREHENSIVE METABOLIC PANEL
ALT: 44 U/L (ref 0–44)
AST: 43 U/L — ABNORMAL HIGH (ref 15–41)
Albumin: 3.2 g/dL — ABNORMAL LOW (ref 3.5–5.0)
Alkaline Phosphatase: 124 U/L (ref 38–126)
Anion gap: 14 (ref 5–15)
BUN: 50 mg/dL — ABNORMAL HIGH (ref 8–23)
CO2: 25 mmol/L (ref 22–32)
Calcium: 8.3 mg/dL — ABNORMAL LOW (ref 8.9–10.3)
Chloride: 95 mmol/L — ABNORMAL LOW (ref 98–111)
Creatinine, Ser: 7.6 mg/dL — ABNORMAL HIGH (ref 0.61–1.24)
GFR, Estimated: 7 mL/min — ABNORMAL LOW (ref >60)
Glucose, Bld: 164 mg/dL — ABNORMAL HIGH (ref 70–99)
Potassium: 4.1 mmol/L (ref 3.5–5.1)
Sodium: 134 mmol/L — ABNORMAL LOW (ref 135–145)
Total Bilirubin: 0.6 mg/dL (ref 0.3–1.2)
Total Protein: 6.8 g/dL (ref 6.5–8.1)

## 2021-06-28 LAB — VITAMIN B12: Vitamin B-12: 1509 pg/mL — ABNORMAL HIGH (ref 180–914)

## 2021-06-28 LAB — LACTIC ACID, PLASMA
Lactic Acid, Venous: 4.8 mmol/L (ref 0.5–1.9)
Lactic Acid, Venous: >9 mmol/L (ref 0.5–1.9)
Lactic Acid, Venous: 3.1 mmol/L (ref 0.5–1.9)

## 2021-06-28 LAB — CBC
HCT: 43.2 % (ref 39.0–52.0)
Hemoglobin: 14 g/dL (ref 13.0–17.0)
MCH: 33.7 pg (ref 26.0–34.0)
MCHC: 32.4 g/dL (ref 30.0–36.0)
MCV: 104.1 fL — ABNORMAL HIGH (ref 80.0–100.0)
Platelets: 126 10*3/uL — ABNORMAL LOW (ref 150–400)
RBC: 4.15 MIL/uL — ABNORMAL LOW (ref 4.22–5.81)
RDW: 16.4 % — ABNORMAL HIGH (ref 11.5–15.5)
WBC: 4.9 10*3/uL (ref 4.0–10.5)
nRBC: 0 % (ref 0.0–0.2)

## 2021-06-28 LAB — HEMOGLOBIN A1C
Hgb A1c MFr Bld: 8.1 % — ABNORMAL HIGH (ref 4.8–5.6)
Mean Plasma Glucose: 185.77 mg/dL

## 2021-06-28 LAB — TROPONIN I (HIGH SENSITIVITY)
Troponin I (High Sensitivity): 687 ng/L (ref <18)
Troponin I (High Sensitivity): 87 ng/L — ABNORMAL HIGH (ref <18)

## 2021-06-28 LAB — PROTIME-INR
INR: 3.5 — ABNORMAL HIGH (ref 0.8–1.2)
Prothrombin Time: 34.8 seconds — ABNORMAL HIGH (ref 11.4–15.2)

## 2021-06-28 LAB — BLOOD GAS, VENOUS
Acid-Base Excess: 2.5 mmol/L — ABNORMAL HIGH (ref 0.0–2.0)
Bicarbonate: 24 mmol/L (ref 20.0–28.0)
Drawn by: 6051
FIO2: 28
O2 Saturation: 40.9 %
Patient temperature: 36.5
pCO2, Ven: 55.4 mmHg (ref 44.0–60.0)
pH, Ven: 7.323 (ref 7.250–7.430)
pO2, Ven: <31 mmHg — CL (ref 32.0–45.0)

## 2021-06-28 LAB — PROCALCITONIN: Procalcitonin: 5.53 ng/mL

## 2021-06-28 LAB — MAGNESIUM: Magnesium: 2.3 mg/dL (ref 1.7–2.4)

## 2021-06-28 LAB — GLUCOSE, CAPILLARY
Glucose-Capillary: 141 mg/dL — ABNORMAL HIGH (ref 70–99)
Glucose-Capillary: 144 mg/dL — ABNORMAL HIGH (ref 70–99)
Glucose-Capillary: 149 mg/dL — ABNORMAL HIGH (ref 70–99)
Glucose-Capillary: 137 mg/dL — ABNORMAL HIGH (ref 70–99)

## 2021-06-28 LAB — HIV ANTIBODY (ROUTINE TESTING W REFLEX): HIV Screen 4th Generation wRfx: NONREACTIVE

## 2021-06-28 LAB — MRSA NEXT GEN BY PCR, NASAL: MRSA by PCR Next Gen: NOT DETECTED

## 2021-06-28 LAB — PHOSPHORUS: Phosphorus: 2.7 mg/dL (ref 2.5–4.6)

## 2021-06-28 LAB — FOLATE: Folate: 59.4 ng/mL (ref >5.9)

## 2021-06-28 MED ORDER — LACTATED RINGERS IV BOLUS
500.0000 mL | Freq: Once | INTRAVENOUS | Status: AC
  Administered 2021-06-28: 500 mL via INTRAVENOUS

## 2021-06-28 MED ORDER — DOXYCYCLINE HYCLATE 100 MG PO TABS
100.0000 mg | ORAL_TABLET | Freq: Two times a day (BID) | ORAL | Status: DC
  Administered 2021-06-28 – 2021-07-01 (×7): 100 mg via ORAL
  Filled 2021-06-28 (×7): qty 1

## 2021-06-28 MED ORDER — ACETAMINOPHEN 325 MG PO TABS: 650.0000 mg | ORAL_TABLET | Freq: Four times a day (QID) | ORAL | Status: DC | PRN

## 2021-06-28 MED ORDER — DM-GUAIFENESIN ER 30-600 MG PO TB12
1.0000 | ORAL_TABLET | Freq: Two times a day (BID) | ORAL | Status: DC
  Administered 2021-06-28 – 2021-07-01 (×7): 1 via ORAL
  Filled 2021-06-28 (×7): qty 1

## 2021-06-28 MED ORDER — FERROUS SULFATE 325 (65 FE) MG PO TABS
325.0000 mg | ORAL_TABLET | Freq: Every day | ORAL | Status: DC
  Administered 2021-06-29 – 2021-07-01 (×3): 325 mg via ORAL
  Filled 2021-06-28 (×4): qty 1

## 2021-06-28 MED ORDER — SEVELAMER CARBONATE 800 MG PO TABS
800.0000 mg | ORAL_TABLET | Freq: Three times a day (TID) | ORAL | Status: DC
  Administered 2021-06-28 – 2021-07-01 (×9): 800 mg via ORAL
  Filled 2021-06-28 (×12): qty 1

## 2021-06-28 MED ORDER — INSULIN ASPART 100 UNIT/ML IJ SOLN: 0.0000 [IU] | Freq: Every day | INTRAMUSCULAR | Status: DC

## 2021-06-28 MED ORDER — VANCOMYCIN HCL IN DEXTROSE 1-5 GM/200ML-% IV SOLN: 1000.0000 mg | INTRAVENOUS | Status: DC

## 2021-06-28 MED ORDER — SODIUM CHLORIDE 0.9 % IV SOLN
2.0000 g | Freq: Once | INTRAVENOUS | Status: AC
  Administered 2021-06-28: 2 g via INTRAVENOUS
  Filled 2021-06-28: qty 2

## 2021-06-28 MED ORDER — VANCOMYCIN HCL 2000 MG/400ML IV SOLN
2000.0000 mg | Freq: Once | INTRAVENOUS | Status: AC
  Administered 2021-06-28: 2000 mg via INTRAVENOUS
  Filled 2021-06-28: qty 400

## 2021-06-28 MED ORDER — INSULIN ASPART 100 UNIT/ML IJ SOLN: 0.0000 [IU] | Freq: Three times a day (TID) | INTRAMUSCULAR | Status: DC

## 2021-06-28 MED ORDER — IOHEXOL 350 MG/ML SOLN
100.0000 mL | Freq: Once | INTRAVENOUS | Status: AC | PRN
  Administered 2021-06-28: 100 mL via INTRAVENOUS

## 2021-06-28 MED ORDER — ACETAMINOPHEN 650 MG RE SUPP: 650.0000 mg | Freq: Four times a day (QID) | RECTAL | Status: DC | PRN

## 2021-06-28 MED ORDER — PANTOPRAZOLE SODIUM 40 MG PO TBEC
40.0000 mg | DELAYED_RELEASE_TABLET | Freq: Two times a day (BID) | ORAL | Status: DC
  Administered 2021-06-28 – 2021-07-01 (×7): 40 mg via ORAL
  Filled 2021-06-28 (×7): qty 1

## 2021-06-28 MED ORDER — SODIUM CHLORIDE 0.9 % IV SOLN
1.0000 g | INTRAVENOUS | Status: DC
  Administered 2021-06-28 – 2021-06-30 (×3): 1 g via INTRAVENOUS
  Filled 2021-06-28 (×4): qty 1

## 2021-06-28 MED ORDER — NOREPINEPHRINE 4 MG/250ML-% IV SOLN
2.0000 ug/min | INTRAVENOUS | Status: DC
  Administered 2021-06-28 – 2021-06-29 (×2): 2 ug/min via INTRAVENOUS
  Filled 2021-06-28 (×2): qty 250

## 2021-06-28 MED ORDER — CINACALCET HCL 30 MG PO TABS
30.0000 mg | ORAL_TABLET | Freq: Every day | ORAL | Status: DC
  Administered 2021-06-28 – 2021-07-01 (×3): 30 mg via ORAL
  Filled 2021-06-28 (×5): qty 1

## 2021-06-28 MED ORDER — SODIUM CHLORIDE 0.9 % IV SOLN
250.0000 mL | INTRAVENOUS | Status: DC
  Administered 2021-06-28: 250 mL via INTRAVENOUS

## 2021-06-28 MED ORDER — RENA-VITE PO TABS
1.0000 | ORAL_TABLET | Freq: Every day | ORAL | Status: DC
  Administered 2021-06-28 – 2021-07-01 (×4): 1 via ORAL
  Filled 2021-06-28 (×4): qty 1

## 2021-06-28 MED ORDER — CHLORHEXIDINE GLUCONATE CLOTH 2 % EX PADS
6.0000 | MEDICATED_PAD | Freq: Every day | CUTANEOUS | Status: DC
  Administered 2021-06-28 – 2021-07-01 (×4): 6 via TOPICAL

## 2021-06-28 NOTE — TOC Initial Note (Signed)
Transition of Care [facility]) - Initial/Assessment Note    Patient Details  Name: [name] MRN: [id number] Date of Birth: 11-29-1950  Transition of Care [facility]) CM/SW Contact:    Iona Beard, [place] Phone Number: 06/28/2021, 12:30 PM  Clinical Narrative:                 Pt is high risk for readmission. CSW spoke with pts son Legrand Como who is in room with pt to complete assessment. Pt lives alone. Pt is independent in completing his ADLs. Pt drives sometimes but has transportation provided by family if needed. Pt has [place] services currently with [facility]. CSW reached out to [facility] rep to see what services are in place, CSW awaiting response. Pt has a walker to use in the home when needed. TOC to follow.   Expected Discharge Plan: [place] Barriers to Discharge: Continued Medical Work up   Patient Goals and CMS Choice Patient states their goals for this hospitalization and ongoing recovery are:: Home with [facility] CMS Medicare.gov Compare Post Acute Care list provided to:: Patient Choice offered to / list presented to : Adult Children  Expected Discharge Plan and Services Expected Discharge Plan: [place] In-house Referral: Clinical Social Work Discharge Planning Services: CM Consult Post Acute Care Choice: [place] arrangements for the past 2 months: [place]                                      Prior Living Arrangements/Services Living arrangements for the past 2 months: Single Family Home Lives with:: Self Patient language and need for interpreter reviewed:: Yes Do you feel safe going back to the place where you live?: Yes      Need for Family Participation in Patient Care: Yes (Comment) Care giver support system in place?: Yes (comment) Current home services: DME Criminal Activity/Legal Involvement Pertinent to Current Situation/Hospitalization: No - Comment as needed  Activities of Daily Living Home Assistive  Devices/Equipment: None ADL Screening (condition at time of admission) Patient's cognitive ability adequate to safely complete daily activities?: Yes Is the patient deaf or have difficulty hearing?: No Does the patient have difficulty seeing, even when wearing glasses/contacts?: No Does the patient have difficulty concentrating, remembering, or making decisions?: No Patient able to express need for assistance with ADLs?: Yes Does the patient have difficulty dressing or bathing?: No Independently performs ADLs?: Yes (appropriate for developmental age) Does the patient have difficulty walking or climbing stairs?: No Weakness of Legs: None Weakness of Arms/Hands: None  Permission Sought/Granted                  Emotional Assessment Appearance:: Appears stated age Attitude/Demeanor/Rapport: Engaged Affect (typically observed): Accepting Orientation: : Oriented to Self, Oriented to Place, Oriented to  Time, Oriented to Situation Alcohol / Substance Use: Not Applicable Psych Involvement: No (comment)  Admission diagnosis:  SOB (shortness of breath) [R06.02] HCAP (healthcare-associated pneumonia) [J18.9] Sepsis ([place]) [A41.9] Sepsis with acute organ dysfunction and septic shock, due to unspecified organism, unspecified type ([place]) [A41.9, R65.21] Patient Active Problem List   Diagnosis Date Noted   HCAP (healthcare-associated pneumonia) 06/28/2021   Supratherapeutic INR 06/28/2021   Prolonged QT interval 06/28/2021   Anemia of chronic disease 06/28/2021   Elevated troponin 06/28/2021   Elevated lipase 06/28/2021   Elevated brain natriuretic peptide (BNP) level 06/28/2021   GERD (gastroesophageal reflux  disease) 06/28/2021   Chronic combined systolic and diastolic CHF (congestive heart failure) ([place]) 06/28/2021   Dehydration 06/28/2021   Sepsis ([place]) 06/28/2021   Severe sepsis with septic shock ([place]) 06/28/2021   Lobar pneumonia ([place]) 06/28/2021   Atrial fibrillation and flutter  (HCC)    Ulcer of sphincterotomy site post ERCP    Upper GI bleed    Blood loss anemia    Common bile duct stone    Cholelithiasis without obstruction 05/13/2020   Class 1 obesity due to excess calories with body mass index (BMI) of 33.0 to 33.9 in adult 05/13/2020   Hypothyroidism, unspecified 03/25/2020   Allergy, unspecified, initial encounter 12/22/2019   Anaphylactic shock, unspecified, initial encounter 12/22/2019   Procedure and treatment not carried out, unspecified reason 03/12/2019   Anaphylactic reaction due to adverse effect of correct drug or medicament properly administered, initial encounter 02/15/2019   Malnutrition of moderate degree 39/07/90   Toxic metabolic encephalopathy 33/00/7622   Elevated LFTs    Idiopathic acute pancreatitis without infection or necrosis    Acute respiratory failure with hypoxia (HCC)    Cholecystitis    Lactic acidosis    Pericardial effusion with cardiac tamponade    Hypotension 01/09/2018   Skin cancer of scalp or skin of neck 11/11/2016   Abnormal nuclear stress test 63/33/5456   Systolic heart failure (HCC)    Other abnormalities of gait and mobility 07/10/2016   Arteriovenous fistula, acquired ([place]) 07/08/2016   Dependence on other enabling machines and devices 05/01/2016   Diarrhea, unspecified 04/25/2016   Partial traumatic amputation of right foot, level unspecified, initial encounter ([place]) 04/16/2016   Other disorders resulting from impaired renal tubular function 04/06/2016   Headache, unspecified 03/17/2016   Other urticaria 03/17/2016   Calcaneal spur, right foot 03/13/2016   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, initial encounter ([place]) 03/13/2016   Generalized edema 03/10/2016   Localized enlarged lymph nodes 03/10/2016   Hardening of the aorta (main artery of the heart) ([place]) 03/05/2016   History of colonic polyps    Other disorders of calcium metabolism 10/03/2013   Iron deficiency anemia, unspecified  09/23/2013   Encounter for immunization 08/26/2013   Secondary hyperparathyroidism of renal origin ([place]) 08/15/2013   Other sleep apnea 08/08/2013   Personal history of other diseases of the musculoskeletal system and connective tissue 08/08/2013   Hyperglycemia due to diabetes mellitus ([place]) 08/04/2013   End-stage renal disease on hemodialysis ([place]) 01/31/2013   Chronic anticoagulation 12/27/2012   Vitreous hemorrhage ([place]) 04/19/2012   NSVT (nonsustained ventricular tachycardia) 03/18/2012   Noncompliance 03/16/2012   Chest pain, no MI, negative myoview, most likely muscular sketal pain 03/15/2012   Proliferative diabetic retinopathy associated with type 2 diabetes mellitus ([place]) 08/18/2011   Traction detachment of left retina 08/18/2011   Non Hodgkin's lymphoma ([place])    Hyperlipidemia    Diabetic nephropathy (HCC)    DVT (deep venous thrombosis) ([place])    Healthcare-associated pneumonia 01/15/2011   Macrocytic anemia 01/12/2011   History of DVT of lower extremity 01/12/2011   ABDOMINAL PAIN -GENERALIZED 10/24/2008   PERSONAL HX COLONIC POLYPS 10/24/2008   PCP:  Burnard Bunting, MD Pharmacy:   [facility] [address], [place] [address] [place] [address] Phone: [phone] Fax: [phone]  [facility] [place], [place] - [address] [place] [address] Phone: [phone] Fax: [place], [place] - [address], [address] [place] [address] Phone: [phone] Fax: [phone]  [facility] [address] [place] [address] Phone: [phone] Fax: [phone]     Social Determinants of Health (SDOH) Interventions    Readmission Risk Interventions Readmission Risk Prevention Plan 06/28/2021  Transportation Screening Complete  HRI or [place] Complete  Social Work Consult for  [place] Planning/Counseling Complete  Palliative Care Screening Not Applicable  Medication Review Press photographer) Complete  Some recent data might be hidden

## 2021-06-28 NOTE — Progress Notes (Signed)
°  Echocardiogram 2D Echocardiogram has been performed.  Fidel Levy 06/28/2021, 2:46 PM

## 2021-06-28 NOTE — ED Notes (Signed)
Verified order for 500 bolus with EDP

## 2021-06-28 NOTE — Progress Notes (Signed)
PROGRESS NOTE  [name]:[id number] DOB: May 06, 1951 DOA: 06/27/2021 PCP: Burnard Bunting, MD  Brief History:  71 year old male with a history of ESRD (TTS), chronic atrial fibrillation/atrial flutter, DVT right leg, diabetes mellitus type 2, hypertension, hyperlipidemia, NHL in remission, COPD, systolic CHF presenting with 2-day history of shortness of breath, generalized abdominal pain, and diarrhea that began on the evening of 06/26/2020 after dialysis.  The patient states that he had been in usual state of health until after his dialysis on 06/26/2020.  He states that he has been compliant with dialysis testing the whole time.  He states that his only new medication is midodrine which he started 2 weeks ago.  He states that he only takes it on dialysis days.  He denies any recent antibiotics.  He denies any fevers, chills, headache, neck pain, coughing, hemoptysis, chest pain.  He denies any worsening peripheral edema.  He states that his abdominal pain was associated with liquid stools of which he had 5-6 on 06/27/2021.  There is no hematochezia, melena.  He states that his pain was constant with intermittent cramping.  He denies any recent antibiotics.  There is been no sick contacts or unusual or undercooked foods.  He had some nausea without any emesis.  He endorses compliance with all his medications. In the ED, the patient was afebrile and hypotensive with systolic blood pressure in the 70s.  He was given 3 L of fluid but remained hypotensive.  He was started on Levophed.  Oxygen saturation was 92% room air.  He was placed on 2 L supplemental with saturation up to 99%.  He was initially tachycardic 110-120s.  Patient was started on vancomycin and cefepime.  However the second blood culture was obtained after the patient has been started on antibiotics.  Chest x-ray showed L>R bibasilar opacities.  CT chest, abdomen, pelvis was negative for any acute findings.  Notably, there was no  splenomegaly or pancreatic or bowel inflammatory changes or bili ductal dilatation.  There was an IVC filter.  The patient was admitted for further treatment and evaluation of his sepsis.  COVID-19 PCR and influenza PCR negative  Assessment/Plan: Septic shock -Patient presented with tachycardia, hypotension refractory to fluid resuscitation -Lactic acid 4.8>>greater than 9 -Continue Levophed>> wean for SBP greater than 90 -At baseline, the patient takes midodrine for soft blood pressures -Continue midodrine -Follow blood cultures -PCT 5.53  Lobar pneumonia -Continue empiric vancomycin and cefepime -Add empiric doxycycline -MRSA screen  Lactic acidosis -Check VBG -Secondary to sepsis and volume depletion -No signs of ischemic colitis or enteritis on CT abdomen  Diarrhea -Stool pathogen panel -Check C. difficile  ESRD -He is TTS, -last HD on 06/26/21 -Nephrology consult for maintenance HD -Continue Renvela and Sensipar  Diabetes mellitus type 2 -NovoLog sliding scale -Hemoglobin A1c  Chronic atrial fibrillation/a flutter -Continue warfarin -Currently rate controlled  GERD -Continue pantoprazole  Supratherapeutic INR -Daily INR -Pharmacy to assist with Coumadin dosing -Monitor for active signs of bleeding  Chronic systolic CHF due to NICM -Echo 04/2019 with normalization of his EF.  -Echo 02/07/2021 at [facility] showed EF remains >55% -GDMT limited somewhat from ESRD  Elevated lipase -no pancreatic inflammation on CT abd -likely due to ESRD  Elevated troponin -no chest pain -EKG unchanged from prior with RBBB -due to ESRD and demand ischemia    Family Communication:   no Family at bedside  Consultants:  renal  Code Status:  FULL  DVT Prophylaxis:  warfarin   Procedures: As Listed in Progress Note Above  Antibiotics: Vanc 1/28>> Cefepime 1/28>>    The patient is critically ill with multiple organ systems failure and requires high complexity  decision making for assessment and support, frequent evaluation and titration of therapies, application of advanced monitoring technologies and extensive interpretation of multiple databases.  Critical care time - 45 mins.      Subjective:  Patient states that he is feeling better since arrival to the hospital.  He has not had a bowel movement so far during this hospitalization.  He states that his breathing is better.  He denies any fever, chills, headache, chest pain, coughing, hemoptysis, vomiting, medication, melena.  He states that his abdominal pain is improving Objective: Vitals:   06/28/21 0544 06/28/21 0550 06/28/21 0600 06/28/21 0621  BP: (!) 133/49 (!) 151/43 (!) 130/42 103/90  Pulse: 69 68 67 78  Resp: 15 18 16 20   Temp:      TempSrc:      SpO2: 96% 97% 98% 100%  Weight:      Height:        Intake/Output Summary (Last 24 hours) at 06/28/2021 0648 Last data filed at 06/28/2021 0346 Gross per 24 hour  Intake 3500 ml  Output --  Net 3500 ml   Weight change:  Exam:  General:  Pt is alert, follows commands appropriately, not in acute distress HEENT: No icterus, No thrush, No neck mass, [place]/AT Cardiovascular: IRRR, S1/S2, no rubs, no gallops Respiratory: Bibasilar crackles L>R.  No wheezing Abdomen: Soft/+BS, non tender, non distended, no guarding Extremities: No edema, No lymphangitis, No petechiae, No rashes, no synovitis; no open or draining wounds on the foot bilateral   Data Reviewed: I have personally reviewed following labs and imaging studies Basic Metabolic Panel: Recent Labs  Lab 06/27/21 2200  NA 138  K 3.9  CL 97*  CO2 22  GLUCOSE 234*  BUN 46*  CREATININE 7.28*  CALCIUM 8.3*   Liver Function Tests: Recent Labs  Lab 06/27/21 2200  AST 43*  ALT 41  ALKPHOS 115  BILITOT 0.7  PROT 5.5*  ALBUMIN 2.8*   Recent Labs  Lab 06/27/21 2200  LIPASE 79*   No results for input(s): AMMONIA in the last 168 hours. Coagulation Profile: Recent Labs   Lab 06/27/21 2200  INR 3.7*   CBC: Recent Labs  Lab 06/27/21 2200  WBC 6.8  HGB 13.8  HCT 43.9  MCV 103.5*  PLT 122*   Cardiac Enzymes: No results for input(s): CKTOTAL, CKMB, CKMBINDEX, TROPONINI in the last 168 hours. BNP: Invalid input(s): POCBNP CBG: Recent Labs  Lab 06/27/21 2117  GLUCAP 231*   HbA1C: No results for input(s): HGBA1C in the last 72 hours. Urine analysis:    Component Value Date/Time   COLORURINE YELLOW 08/04/2013 0249   APPEARANCEUR CLEAR 08/04/2013 0249   LABSPEC 1.013 08/04/2013 0249   PHURINE 7.5 08/04/2013 0249   GLUCOSEU 500 (A) 08/04/2013 0249   HGBUR SMALL (A) 08/04/2013 0249   BILIRUBINUR NEGATIVE 08/04/2013 0249   KETONESUR NEGATIVE 08/04/2013 0249   PROTEINUR 100 (A) 08/04/2013 0249   UROBILINOGEN 0.2 08/04/2013 0249   NITRITE NEGATIVE 08/04/2013 0249   LEUKOCYTESUR NEGATIVE 08/04/2013 0249   Sepsis Labs: @LABRCNTIP (procalcitonin:4,lacticidven:4) ) Recent Results (from the past 240 hour(s))  Resp Panel by RT-PCR (Flu A&B, Covid) Nasopharyngeal Swab     Status: None   Collection Time: 06/27/21  8:42 PM   Specimen: Nasopharyngeal Swab; Nasopharyngeal(NP) swabs  in vial transport medium  Result Value Ref Range Status   SARS Coronavirus 2 by RT PCR NEGATIVE NEGATIVE Final    Comment: (NOTE) SARS-CoV-2 target nucleic acids are NOT DETECTED.  The SARS-CoV-2 RNA is generally detectable in upper respiratory specimens during the acute phase of infection. The lowest concentration of SARS-CoV-2 viral copies this assay can detect is 138 copies/mL. A negative result does not preclude SARS-Cov-2 infection and should not be used as the sole basis for treatment or other patient management decisions. A negative result may occur with  improper specimen collection/handling, submission of specimen other than nasopharyngeal swab, presence of viral mutation(s) within the areas targeted by this assay, and inadequate number of viral copies(<138  copies/mL). A negative result must be combined with clinical observations, patient history, and epidemiological information. The expected result is Negative.  Fact Sheet for Patients:  EntrepreneurPulse.com.au  Fact Sheet for Healthcare Providers:  IncredibleEmployment.be  This test is no t yet approved or cleared by the [place] FDA and  has been authorized for detection and/or diagnosis of SARS-CoV-2 by FDA under an Emergency Use Authorization (EUA). This EUA will remain  in effect (meaning this test can be used) for the duration of the COVID-19 declaration under Section 564(b)(1) of the Act, 21 U.S.C.section 360bbb-3(b)(1), unless the authorization is terminated  or revoked sooner.       Influenza A by PCR NEGATIVE NEGATIVE Final   Influenza B by PCR NEGATIVE NEGATIVE Final    Comment: (NOTE) The Xpert Xpress SARS-CoV-2/FLU/RSV plus assay is intended as an aid in the diagnosis of influenza from Nasopharyngeal swab specimens and should not be used as a sole basis for treatment. Nasal washings and aspirates are unacceptable for Xpert Xpress SARS-CoV-2/FLU/RSV testing.  Fact Sheet for Patients: EntrepreneurPulse.com.au  Fact Sheet for Healthcare Providers: IncredibleEmployment.be  This test is not yet approved or cleared by the [place] FDA and has been authorized for detection and/or diagnosis of SARS-CoV-2 by FDA under an Emergency Use Authorization (EUA). This EUA will remain in effect (meaning this test can be used) for the duration of the COVID-19 declaration under Section 564(b)(1) of the Act, 21 U.S.C. section 360bbb-3(b)(1), unless the authorization is terminated or revoked.  Performed at [facility], [address], [place], [place] [address]      Scheduled Meds:  dextromethorphan-guaiFENesin  1 tablet Oral BID   insulin aspart  0-5 Units Subcutaneous QHS   insulin aspart  0-6  Units Subcutaneous TID WC   midodrine  10 mg Oral TID WC   Continuous Infusions:  sodium chloride 250 mL (06/28/21 0528)   ceFEPime (MAXIPIME) IV     norepinephrine (LEVOPHED) Adult infusion 5 mcg/min (06/28/21 0535)   vancomycin      Procedures/Studies: DG Chest 1 View  Result Date: 06/27/2021 CLINICAL DATA:  Shortness of breath, vomiting EXAM: CHEST  1 VIEW COMPARISON:  07/16/2018 FINDINGS: There is poor inspiration. There are patchy linear densities in both lower lung fields, more so on the left side. Costophrenic angles are clear. There is no pneumothorax. There is mild extrinsic pressure over the left lateral margin of trachea in the lower neck suggesting possible enlarged thyroid. IMPRESSION: There linear patchy densities in both lower lung fields, more so on the left side. Findings suggest possible atelectasis/pneumonia. There is poor inspiration which could account for some of the crowding of markings in the lower lung fields. There is no significant pleural effusion or pneumothorax. Electronically Signed   By: Prudy Feeler.D.  On: 06/27/2021 20:38   CT CHEST ABDOMEN PELVIS W CONTRAST  Result Date: 06/28/2021 CLINICAL DATA:  Abdominal pain and dyspnea EXAM: CT CHEST, ABDOMEN, AND PELVIS WITH CONTRAST TECHNIQUE: Multidetector CT imaging of the chest, abdomen and pelvis was performed following the standard protocol during bolus administration of intravenous contrast. RADIATION DOSE REDUCTION: This exam was performed according to the departmental dose-optimization program which includes automated exposure control, adjustment of the mA and/or kV according to patient size and/or use of iterative reconstruction technique. CONTRAST:  134mL OMNIPAQUE IOHEXOL 350 MG/ML SOLN COMPARISON:  None. FINDINGS: CT CHEST FINDINGS Cardiovascular: Calcific aortic atherosclerosis and coronary artery calcification. No pericardial effusion. Mediastinum/Nodes: No enlarged mediastinal, hilar, or axillary  lymph nodes. Thyroid gland, trachea, and esophagus demonstrate no significant findings. Lungs/Pleura: Bibasilar atelectasis. No pleural effusion or pneumothorax. Musculoskeletal: No chest wall mass or suspicious bone lesions identified. CT ABDOMEN PELVIS FINDINGS Hepatobiliary: No focal liver abnormality is seen. Status post cholecystectomy. No biliary dilatation. Pancreas: Unremarkable. No pancreatic ductal dilatation or surrounding inflammatory changes. Spleen: Normal in size without focal abnormality. Adrenals/Urinary Tract: Adrenal glands are unremarkable. Kidneys are normal, without renal calculi, focal lesion, or hydronephrosis. Bladder is unremarkable. Stomach/Bowel: Stomach is within normal limits. Appendix appears normal. No evidence of bowel wall thickening, distention, or inflammatory changes. Vascular/Lymphatic: Infrarenal IVC filter. Calcific aortic atherosclerosis. No lymphadenopathy. Reproductive: Prostate is unremarkable. Other: Small fat containing ventral abdominal hernia. Musculoskeletal: No acute or significant osseous findings. IMPRESSION: 1. No acute abnormality of the chest, abdomen or pelvis. Aortic Atherosclerosis (ICD10-I70.0). Electronically Signed   By: Ulyses Jarred [name]   On: 06/28/2021 01:54    Orson Eva, DO  [facility]  If 7PM-7AM, please contact night-coverage [email or web address] Password [facility] 06/28/2021, 6:48 AM   LOS: 0 days

## 2021-06-28 NOTE — ED Notes (Signed)
Patient transported to CT 

## 2021-06-28 NOTE — ED Notes (Signed)
Pt returned from CT °

## 2021-06-28 NOTE — ED Notes (Signed)
Pt has not had any diarrhea since arrival

## 2021-06-28 NOTE — ED Notes (Signed)
EDP aware of VS. See orders

## 2021-06-28 NOTE — ED Notes (Signed)
Date and time results received: 06/28/21 8:12 AM   Test: PCO2  Critical Value: less than 30  Name of Provider Notified: Dr. Carles Collet  Orders Received? Or Actions Taken?: see orders.

## 2021-06-28 NOTE — Progress Notes (Signed)
Was called about this chronic HD patient being admitted for sepsis type picture-  was given 3.5 liters of fluid and is now in ICU on pressors.    Today is his HD day however his labs do not demonstrate an absolute need for HD today and his hemodynamic instability makes me nervous as we do not have CRRT capabilities at [facility]. It is my hope that he will stabilize some over the next 24 hours so that we will be able to do his routine HD tomorrow.  I have discussed this with the HD RN  Louis Meckel

## 2021-06-28 NOTE — ED Provider Notes (Signed)
Patient signed out to me with CAT scan chest abdomen and pelvis pending.  Patient was seen earlier today with shortness of breath, diarrhea. Physical Exam  BP (!) 115/54    Pulse 81    Temp 98.2 F (36.8 C) (Oral)    Resp (!) 25    Ht 6\' 3"  (1.905 m)    Wt 111.1 kg    SpO2 96%    BMI 30.62 kg/m   Physical Exam Vitals and nursing note reviewed.  Constitutional:      Appearance: He is well-developed.  HENT:     Head: Atraumatic.  Cardiovascular:     Rate and Rhythm: Normal rate. Rhythm irregular.  Pulmonary:     Effort: Pulmonary effort is normal.     Breath sounds: Normal breath sounds.  Abdominal:     Palpations: Abdomen is soft.     Tenderness: There is no abdominal tenderness. There is no guarding or rebound.  Musculoskeletal:        General: Normal range of motion.     Cervical back: Normal range of motion.     Right lower leg: No edema.     Left lower leg: No edema.  Skin:    General: Skin is warm.  Neurological:     General: No focal deficit present.     Mental Status: He is alert and oriented to person, place, and time.    Procedures  .Critical Care Performed by: Orpah Greek, MD Authorized by: Orpah Greek, MD   Critical care provider statement:    Critical care time (minutes):  32   Critical care was time spent personally by me on the following activities:  Development of treatment plan with patient or surrogate, discussions with consultants, evaluation of patient's response to treatment, examination of patient, ordering and review of laboratory studies, ordering and review of radiographic studies, ordering and performing treatments and interventions, pulse oximetry, re-evaluation of patient's condition and review of old charts  Angiocath insertion Performed by: Orpah Greek  Consent: Verbal consent obtained. Risks and benefits: risks, benefits and alternatives were discussed Time out: Immediately prior to procedure a "time out" was  called to verify the correct patient, procedure, equipment, support staff and site/side marked as required.  Preparation: Patient was prepped and draped in the usual sterile fashion.  Vein Location: left upper arm  Ultrasound Guided  Gauge: 20  Normal blood return and flush without difficulty Patient tolerance: Patient tolerated the procedure well with no immediate complications.    ED Course / MDM    Medical Decision Making Amount and/or Complexity of Data Reviewed Labs: ordered. Radiology: ordered.  Risk Prescription drug management.    Patient has been persistently hypotensive.  I did extensively review his past history and records.  It appears that he does have a history of hypertension but does intermittently have hypotension when he has dialysis, requiring midodrine.  Patient appears to improve when he is getting a fluid bolus but blood pressures then dropped again when the bolus is finished.  At the time I assumed care of the patient, he had received 2 L of lactated Ringer's.  He became hypotensive again and was given an additional 500 mL.  Blood pressures marginally improved with fluids but then began to drop again.  At this point I added a lactic acid, blood cultures, procalcitonin.  Patient's procalcitonin is normal, as is his white blood cell count.  He does, however, have a significantly elevated lactic acid.  This is  after the 2 L of fluid.  Case discussed with Dr. Ilda Mori, on-call for critical care.  He does not feel the patient requires transfer to [facility].  Recommends an additional 568mL to 1000 mL of fluid bolus.  If still hypotensive after that, low-dose Levophed through peripheral line.  Will initiate treatment for pneumonia with cefepime and vancomycin.  Sepsis - Repeat Assessment  Performed at:    [address]     Blood pressure (!) 115/54, pulse 81, temperature 98.2 F (36.8 C), temperature source Oral, resp. rate (!) 25, height 6\' 3"  (1.905 m),  weight 111.1 kg, SpO2 96 %.  Heart:     Irregular rate and rhythm  Lungs:    CTA  Capillary Refill:   <2 sec  Peripheral Pulse:   Radial pulse palpable  Skin:     Normal Color        Orpah Greek, MD 06/28/21 [id number]

## 2021-06-28 NOTE — Progress Notes (Signed)
ANTICOAGULATION CONSULT NOTE - Initial Up Consult   Pharmacy Consult for warfarin dosing  Indication: atrial fibrillation   No Known Allergies    Patient Measurements: Last Weight  Most recent update: 06/28/2021  8:33 AM    Weight  114.1 kg (251 lb 8.7 oz)            Body mass index is 31.44 kg/m. [name]               Temp: 98.4 F (36.9 C) (01/28 0827) Temp Source: Oral (01/28 0827) BP: 136/45 (01/28 0800) Pulse Rate: 84 (01/28 0800)  Labs: Recent Labs    06/27/21 2200 06/28/21 0734  HGB 13.8 14.0  HCT 43.9 43.2  PLT 122* 126*  LABPROT 36.9* 34.8*  INR 3.7* 3.5*  CREATININE 7.28* 7.60*    Estimated Creatinine Clearance: 12.7 mL/min (A) (by C-G formula based on SCr of 7.6 mg/dL (H)).     Medications:  Medications Prior to Admission  Medication Sig Dispense Refill Last Dose   acetaminophen (TYLENOL) 500 MG tablet Take 500-1,000 mg by mouth daily as needed (back pain).      B Complex-C-Folic Acid (RENAL MULTIVITAMIN FORMULA) TABS Take 1 tablet by mouth daily. Renal Tab II      Cholecalciferol (VITAMIN D3) 125 MCG (5000 UT) TABS Take 5,000 Units by mouth daily.      cinacalcet (SENSIPAR) 30 MG tablet SMARTSIG:1 Tablet(s) By Mouth Every Evening      doxercalciferol (HECTOROL) 4 MCG/2ML injection Inject 2.5 mLs (5 mcg total) into the vein Every Tuesday,Thursday,and Saturday with dialysis. 2 mL     enoxaparin (LOVENOX) 120 MG/0.8ML injection Inject 120 mg into the skin every 12 (twelve) hours.      ferrous sulfate 325 (65 FE) MG tablet Take 325 mg by mouth daily with breakfast.      insulin aspart (NOVOLOG) 100 UNIT/ML injection Inject 1-9 Units into the skin 3 (three) times daily before meals.      lidocaine-prilocaine (EMLA) cream Apply 1 application topically as needed (Apply small amount to access site 1-2 hours before dialysis. Cover with occlusive dressing (saran wrap)).       Multiple Vitamins-Minerals (MULTIVITAMIN WITH MINERALS) tablet Take 1 tablet by  mouth daily.      multivitamin (RENA-VIT) TABS tablet Take 1 tablet by mouth daily.      oxyCODONE-acetaminophen (PERCOCET) 5-325 MG tablet Take 1 tablet by mouth every 6 (six) hours as needed for severe pain. 8 tablet 0    pantoprazole (PROTONIX) 40 MG tablet Take 1 tablet (40 mg total) by mouth 2 (two) times daily. 60 tablet 1    sevelamer carbonate (RENVELA) 800 MG tablet Take 800 mg by mouth 3 (three) times daily with meals.      vitamin C (ASCORBIC ACID) 500 MG tablet Take 500 mg by mouth daily.      warfarin (COUMADIN) 5 MG tablet Take 7.5 mg by mouth daily.      zinc sulfate 220 (50 Zn) MG capsule Take 220 mg by mouth daily.      Scheduled:   cinacalcet  30 mg Oral Q supper   dextromethorphan-guaiFENesin  1 tablet Oral BID   [START ON 06/29/2021] ferrous sulfate  325 mg Oral Q breakfast   insulin aspart  0-5 Units Subcutaneous QHS   insulin aspart  0-6 Units Subcutaneous TID WC   midodrine  10 mg Oral TID WC   multivitamin  1 tablet Oral Daily   pantoprazole  40 mg  Oral BID   sevelamer carbonate  800 mg Oral TID WC   Infusions:   sodium chloride 250 mL (06/28/21 0528)   ceFEPime (MAXIPIME) IV     norepinephrine (LEVOPHED) Adult infusion 5 mcg/min (06/28/21 0535)   vancomycin     PRN: acetaminophen **OR** acetaminophen, acetaminophen Anti-infectives (From admission, onward)    Start     Dose/Rate Route Frequency Ordered Stop   06/28/21 2200  ceFEPIme (MAXIPIME) 1 g in sodium chloride 0.9 % 100 mL IVPB        1 g 200 mL/hr over 30 Minutes Intravenous Every 24 hours 06/28/21 0457     06/28/21 1200  vancomycin (VANCOCIN) IVPB 1000 mg/200 mL premix        1,000 mg 200 mL/hr over 60 Minutes Intravenous Every T-Th-Sa (Hemodialysis) 06/28/21 0457     06/28/21 0230  vancomycin (VANCOREADY) IVPB 2000 mg/400 mL        2,000 mg 200 mL/hr over 120 Minutes Intravenous  Once 06/28/21 0225 06/28/21 0501   06/28/21 0230  ceFEPIme (MAXIPIME) 2 g in sodium chloride 0.9 % 100 mL IVPB         2 g 200 mL/hr over 30 Minutes Intravenous  Once 06/28/21 0225 06/28/21 0340       Goal of Therapy:  INR 2-3 Monitor platelets by anticoagulation protocol: Yes    Prior to Admission Warfarin Dosing:  [name] takes 7.5mg  of warfarin daily       Admit INR was 3.7 Lab Results  Component Value Date   INR 3.5 (H) 06/28/2021   INR 3.7 (H) 06/27/2021   INR 1.2 11/06/2020    Assessment: [name] a 71 y.o. male requires anticoagulation with warfarin for the indication of  atrial fibrillation. Warfarin will be initiated inpatient following pharmacy protocol per pharmacy consult. Patient most recent blood work is as follows: CBC Latest Ref Rng & Units 06/28/2021 06/27/2021 11/06/2020  WBC 4.0 - 10.5 K/uL 4.9 6.8 -  Hemoglobin 13.0 - 17.0 g/dL 14.0 13.8 10.9(L)  Hematocrit 39.0 - 52.0 % 43.2 43.9 32.0(L)  Platelets 150 - 400 K/uL 126(L) 122(L) -   INR 3.5, supratherapeutic  Plan: Hold warfarin x 1 dose tonight  Monitor CBC MWF with am labs   Monitor INR daily Monitor for signs and symptoms of bleeding   [name], PharmD, MBA, BCGP Clinical Pharmacist

## 2021-06-28 NOTE — Progress Notes (Signed)
Date and time results received: 06/28/21 2395  Critical Value: Lactic 3.1 Troponin 687  Name of Provider Notified: Dr. Carles Collet  Orders Received? MD aware. Lactic to be repeated with 1/29 AM lab work

## 2021-06-28 NOTE — Progress Notes (Signed)
Pharmacy Antibiotic Note  [name] is a 71 y.o. male admitted on 06/27/2021 with pneumonia.  Pharmacy has been consulted for Vancomycin/Cefepime dosing. WBC WNL. ESRD on HD TTS.   Plan: Vancomycin 2000 mg IV x 1, then 1000 mg IV qHD TTS Cefepime 1g IV q24h Trend WBC, temp, HD schedule F/U infectious work-up Drug levels as indicated   Height: 6\' 3"  (190.5 cm) Weight: 111.1 kg (245 lb) IBW/kg (Calculated) : 84.5  Temp (24hrs), Avg:98.2 F (36.8 C), Min:98.2 F (36.8 C), Max:98.2 F (36.8 C)  Recent Labs  Lab 06/27/21 2200 06/28/21 0104  WBC 6.8  --   CREATININE 7.28*  --   LATICACIDVEN  --  4.8*    Estimated Creatinine Clearance: 13.1 mL/min (A) (by C-G formula based on SCr of 7.28 mg/dL (H)).    No Known Allergies  Narda Bonds, PharmD, BCPS Clinical Pharmacist Phone: [phone]

## 2021-06-28 NOTE — H&P (Addendum)
History and Physical  [name] IWP:[id number] DOB: 05/24/1951 DOA: 06/27/2021  Referring physician: Orpah Greek, MD PCP: Burnard Bunting, MD  Patient coming from: Home  Chief Complaint: Shortness of breath  HPI: [name] is a 71 y.o. male with medical history significant for ESRD on HD (TThS), hypotension (especially after dialysis), T2DM,  combined chronic HFrEF and HFpEF, A. fib/flutter on warfarin who presents to the emergency department due to 2-day onset of progressively worsening shortness of breath and abdominal pain.  He has had several episodes of watery diarrhea and he endorsed worsening shortness of breath on bowel movement patient is also complained of mild generalized abdominal pain associated with nausea without vomiting.  Last bowel movement was few hours prior to arrival to the ED.  He denies chest pain, fever, chills, sick contacts, headache or blurry vision  ED Course:  In the emergency department, he was intermittently tachypneic and tachycardic, BP was soft at 80/48.  Work-up in the ED showed macrocytic anemia, thrombocytopenia, BUN/creatinine 46/7.28 with eGFR of 8, hyperglycemia, AST 43, lipase 79, procalcitonin 5.53, troponin x2 - 42 > 87, BNP 411.  Lactic acid 4.8. Influenza A, B, SARS coronavirus 2 was negative. CT chest, abdomen and pelvis with contrast showed no acute abnormality of the chest, abdomen or pelvis Chest x-ray showed linear patchy densities in both lower lung fields, more so on the left side with findings suggesting possible atelectasis/pneumonia. Patient continues to have BP in hypotensive range, IV hydration was provided, but BP continues to be in hypotensive range, intensivist Dr. Ilda Mori was consulted and recommended giving more fluids (0.5-1 L) and to start patient on peripheral IV pressor if BP still continues to stay elevated and that there was no indication at the moment to transfer patient to [facility].  He was started on IV vancomycin  and cefepime, Zofran was given and patient was started on midodrine.  Hospitalist was asked to admit patient for further evaluation and management.   Review of Systems: A full 10 point Review of Systems was done, except as stated above, all other Review of systems were negative.   Past Medical History:  Diagnosis Date   Anemia    Arthritis    HNP- lumbar, "all over my body"   Atrial fibrillation and flutter ([place])    Blood transfusion    "years ago; blood was low" (08/05/2013)   COPD (chronic obstructive pulmonary disease) (HCC)    Diabetic nephropathy (HCC)    Diabetic retinopathy    DVT (deep venous thrombosis) ([place])    "got one in my right leg now; I've had one before too, not sure which leg" (08/05/2013)   ESRD (end stage renal disease) on dialysis ([place])    TTS, [place]   Family history of anesthesia complication    " my son wakes up slowly"   GERD (gastroesophageal reflux disease)    uses alka seltzere on occas.    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 unsure of several answers to health history questions    Septic shock ([place])  Skin cancer    melanoma - head   Sleep apnea    "suppose to have a sleep study, but they never told me when. (08/05/2013)   Past Surgical History:  Procedure Laterality Date   ACHILLES TENDON SURGERY Right 03/13/2016   Procedure: ACHILLES LENGTHENING/KIDNER;  Surgeon: Edrick Kins, DPM;  Location: [place];  Service: Podiatry;  Laterality: Right;   AV FISTULA PLACEMENT Left 02/03/2013   Procedure: ARTERIOVENOUS (AV) FISTULA CREATION- LEFT RADIAL CEPHALIC; ULTRASOUND  GUIDED;  Surgeon: Mal Misty, MD;  Location: [place];  Service: Vascular;  Laterality: Left;   AV FISTULA PLACEMENT Right 11/08/2015   Procedure: RIGHT BRACHIOCEPHALIC ARTERIOVENOUS (AV) FISTULA CREATION;  Surgeon: Serafina Mitchell, MD;  Location: [place];  Service: Vascular;  Laterality: Right;   [place] Right 01/24/2016   Procedure: RIGHT SECOND STAGE BASILIC VEIN TRANSPOSITION;  Surgeon: Angelia Mould, MD;  Location: [place];  Service: Vascular;  Laterality: Right;   BIOPSY  05/17/2020   Procedure: BIOPSY;  Surgeon: Irving Copas., MD;  Location: [place];  Service: Gastroenterology;;   CARDIAC CATHETERIZATION     CHOLECYSTECTOMY N/A 05/18/2020   Procedure: LAPAROSCOPIC CHOLECYSTECTOMY WITH INTRAOPERATIVE CHOLANGIOGRAM;  Surgeon: [name], MD;  Location: [place];  Service: General;  Laterality: N/A;   COLONOSCOPY N/A 09/23/2015   Procedure: COLONOSCOPY;  Surgeon: Irene Shipper, MD;  Location: [facility];  Service: Endoscopy;  Laterality: N/A;   Coloscopy     ENDOSCOPIC RETROGRADE CHOLANGIOPANCREATOGRAPHY (ERCP) WITH PROPOFOL N/A 05/17/2020   Procedure: ENDOSCOPIC RETROGRADE CHOLANGIOPANCREATOGRAPHY (ERCP) WITH PROPOFOL;  Surgeon: Rush Landmark Telford Nab., MD;  Location: [place];  Service: Gastroenterology;  Laterality: N/A;   ESOPHAGOGASTRODUODENOSCOPY (EGD) WITH PROPOFOL N/A 05/22/2020   Procedure: ESOPHAGOGASTRODUODENOSCOPY (EGD) WITH PROPOFOL;  Surgeon: Gatha Mayer, MD;  Location: [place];  Service: Endoscopy;  Laterality: N/A;   EYE SURGERY Bilateral    GAS INSERTION  05/10/2012   Procedure: INSERTION OF GAS;  Surgeon: Hayden Pedro, MD;  Location: [place];  Service: Ophthalmology;  Laterality: Right;   LEFT HEART CATH AND CORONARY ANGIOGRAPHY N/A 09/11/2016   Procedure: Left Heart Cath and Coronary Angiography;  Surgeon: Belva Crome, MD;  Location: [place] [facility];  Service: Cardiovascular;  Laterality: N/A;   LESION  EXCISION Right 05/08/2015   Procedure: EXCISION SCALP LESION;  Surgeon: Erroll Luna, MD;  Location: [place];  Service: General;  Laterality: Right;   MEMBRANE PEEL  05/10/2012   Procedure: MEMBRANE PEEL;  Surgeon: Hayden Pedro, MD;  Location: [place];  Service: Ophthalmology;  Laterality: Right;   PARS PLANA VITRECTOMY  08/27/2011   Procedure: PARS PLANA VITRECTOMY WITH 25 GAUGE;  Surgeon: Hayden Pedro, MD;  Location: [place];  Service: Ophthalmology;  Laterality: Left;  Repair of complex traction retinal detachment left eye   PARS PLANA VITRECTOMY  05/10/2012   Procedure: PARS PLANA VITRECTOMY WITH 25 GAUGE;  Surgeon: Hayden Pedro, MD;  Location: [place];  Service: Ophthalmology;  Laterality: Right;  Repair Complex Traction Retinal Detachment   PHOTOCOAGULATION WITH LASER  05/10/2012   Procedure: PHOTOCOAGULATION WITH LASER;  Surgeon: Hayden Pedro, MD;  Location: [place];  Service: Ophthalmology;  Laterality: Right;   PORT-A-CATH REMOVAL     PORTACATH PLACEMENT     REMOVAL OF STONES  05/17/2020   Procedure: REMOVAL OF STONES;  Surgeon: Rush Landmark Telford Nab., MD;  Location: [place];  Service: Gastroenterology;;   REVISON OF ARTERIOVENOUS FISTULA Left 07/31/2020  Procedure: REVISION AND 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;  Surgeon: Rosetta Posner, MD;  Location: [place];  Service: Vascular;  Laterality: Left;   REVISON OF ARTERIOVENOUS FISTULA Left 11/06/2020   Procedure: EXCISION AND CLEANOUT OF LEFT ARM;  Surgeon: Rosetta Posner, MD;  Location: [place];  Service: Vascular;  Laterality: Left;   SPHINCTEROTOMY  05/17/2020   Procedure: Joan Mayans;  Surgeon: Mansouraty, Telford Nab., MD;  Location: [facility];  Service: Gastroenterology;;   SUBXYPHOID PERICARDIAL WINDOW N/A 01/10/2018   Procedure: SUBXYPHOID PERICARDIAL WINDOW;  Surgeon: Rexene Alberts, MD;  Location: [place];  Service: Thoracic;  Laterality: N/A;   TRANSMETATARSAL AMPUTATION Right 03/13/2016    Procedure: TRANSMETATARSAL AMPUTATION;  Surgeon: Edrick Kins, DPM;  Location: [place];  Service: Podiatry;  Laterality: Right;   VENA CAVA FILTER PLACEMENT  09/2012   due to preparation for surgery    Social History:  reports that he has never smoked. He has never used smokeless tobacco. He reports that he does not drink alcohol and does not use drugs.   No Known Allergies  Family History  Problem Relation Age of Onset   Anesthesia problems Son    Hypertension Son    Diabetes Father    Hypertension Father    Other Father        amputation   Colon cancer Neg Hx      Prior to Admission medications   Medication Sig Start Date End Date Taking? Authorizing Provider  acetaminophen (TYLENOL) 500 MG tablet Take 500-1,000 mg by mouth daily as needed (back pain).    [provider]  B Complex-C-Folic Acid (RENAL MULTIVITAMIN FORMULA) TABS Take 1 tablet by mouth daily. Renal Tab II    [provider]  Cholecalciferol (VITAMIN D3) 125 MCG (5000 UT) TABS Take 5,000 Units by mouth daily.    [provider]  cinacalcet (SENSIPAR) 30 MG tablet SMARTSIG:1 Tablet(s) By Mouth Every Evening 12/25/20   [provider]  doxercalciferol (HECTOROL) 4 MCG/2ML injection Inject 2.5 mLs (5 mcg total) into the vein Every Tuesday,Thursday,and Saturday with dialysis. 05/25/20   Nolberto Hanlon, MD  enoxaparin (LOVENOX) 120 MG/0.8ML injection Inject 120 mg into the skin every 12 (twelve) hours.    [provider]  ferrous sulfate 325 (65 FE) MG tablet Take 325 mg by mouth daily with breakfast.    [provider]  insulin aspart (NOVOLOG) 100 UNIT/ML injection Inject 1-9 Units into the skin 3 (three) times daily before meals. 01/21/18   Domenic Polite, MD  lidocaine-prilocaine (EMLA) cream Apply 1 application topically as needed (Apply small amount to access site 1-2 hours before dialysis. Cover with occlusive dressing (saran wrap)).     [provider]   Multiple Vitamins-Minerals (MULTIVITAMIN WITH MINERALS) tablet Take 1 tablet by mouth daily.    [provider]  multivitamin (RENA-VIT) TABS tablet Take 1 tablet by mouth daily. 07/16/20   [provider]  oxyCODONE-acetaminophen (PERCOCET) 5-325 MG tablet Take 1 tablet by mouth every 6 (six) hours as needed for severe pain. 11/06/20   Rhyne, Hulen Shouts, PA-C  pantoprazole (PROTONIX) 40 MG tablet Take 1 tablet (40 mg total) by mouth 2 (two) times daily. 05/24/20 05/24/21  Nolberto Hanlon, MD  sevelamer carbonate (RENVELA) 800 MG tablet Take 800 mg by mouth 3 (three) times daily with meals.    [provider]  vitamin C (ASCORBIC ACID) 500 MG tablet Take 500 mg by mouth daily.    [provider]  warfarin (COUMADIN) 5 MG tablet Take 7.5 mg by mouth daily.    [provider]  zinc sulfate 220 (50 Zn) MG capsule Take 220 mg by mouth daily.    [provider]    Physical Exam: BP (!) 110/95    Pulse 80    Temp 98.2 F (36.8 C) (Oral)    Resp (!) 22    Ht '6\' 3"'  (1.905 m)    Wt 111.1 kg    SpO2 98%    BMI 30.62 kg/m   General: 72 y.o. year-old male well developed well nourished in no acute distress.  Alert and oriented x3. HEENT: Dry mucous membrane.  NCAT, EOMI Neck: Supple, trachea medial Cardiovascular: Tachycardia, irregular rate and rhythm with no rubs or gallops.  No thyromegaly or JVD noted.  No lower extremity edema. 2/4 pulses in all 4 extremities. Respiratory: Clear to auscultation with no wheezes or rales. Good inspiratory effort. Abdomen: Soft, tender to palpation of the abdomen.  Nondistended with normal bowel sounds x4 quadrants. Muskuloskeletal: Bruit heard at the fistula site (left upper arm).  No cyanosis, clubbing or edema noted bilaterally Neuro: CN II-XII intact, strength 5/5 x 4, sensation, reflexes intact Skin: No ulcerative lesions noted or rashes Psychiatry: Judgement and insight appear normal. Mood is appropriate for  condition and setting          Labs on Admission:  Basic Metabolic Panel: Recent Labs  Lab 06/27/21 2200  NA 138  K 3.9  CL 97*  CO2 22  GLUCOSE 234*  BUN 46*  CREATININE 7.28*  CALCIUM 8.3*   Liver Function Tests: Recent Labs  Lab 06/27/21 2200  AST 43*  ALT 41  ALKPHOS 115  BILITOT 0.7  PROT 5.5*  ALBUMIN 2.8*   Recent Labs  Lab 06/27/21 2200  LIPASE 79*   No results for input(s): AMMONIA in the last 168 hours. CBC: Recent Labs  Lab 06/27/21 2200  WBC 6.8  HGB 13.8  HCT 43.9  MCV 103.5*  PLT 122*   Cardiac Enzymes: No results for input(s): CKTOTAL, CKMB, CKMBINDEX, TROPONINI in the last 168 hours.  BNP (last 3 results) Recent Labs    06/27/21 2200  BNP 411.0*    ProBNP (last 3 results) No results for input(s): PROBNP in the last 8760 hours.  CBG: Recent Labs  Lab 06/27/21 2117  GLUCAP 231*    Radiological Exams on Admission: DG Chest 1 View  Result Date: 06/27/2021 CLINICAL DATA:  Shortness of breath, vomiting EXAM: CHEST  1 VIEW COMPARISON:  07/16/2018 FINDINGS: There is poor inspiration. There are patchy linear densities in both lower lung fields, more so on the left side. Costophrenic angles are clear. There is no pneumothorax. There is mild extrinsic pressure over the left lateral margin of trachea in the lower neck suggesting possible enlarged thyroid. IMPRESSION: There linear patchy densities in both lower lung fields, more so on the left side. Findings suggest possible atelectasis/pneumonia. There is poor inspiration which could account for some of the crowding of markings in the lower lung fields. There is no significant pleural effusion or pneumothorax. Electronically Signed   By: Elmer Picker [name]   On: 06/27/2021 20:38   CT CHEST ABDOMEN PELVIS W CONTRAST  Result Date: 06/28/2021 CLINICAL DATA:  Abdominal pain and dyspnea EXAM: CT CHEST, ABDOMEN, AND PELVIS WITH CONTRAST TECHNIQUE: Multidetector CT imaging of the chest,  abdomen and pelvis was performed following the standard protocol during bolus administration of intravenous contrast.  RADIATION DOSE REDUCTION: This exam was performed according to the departmental dose-optimization program which includes automated exposure control, adjustment of the mA and/or kV according to patient size and/or use of iterative reconstruction technique. CONTRAST:  147m OMNIPAQUE IOHEXOL 350 MG/ML SOLN COMPARISON:  None. FINDINGS: CT CHEST FINDINGS Cardiovascular: Calcific aortic atherosclerosis and coronary artery calcification. No pericardial effusion. Mediastinum/Nodes: No enlarged mediastinal, hilar, or axillary lymph nodes. Thyroid gland, trachea, and esophagus demonstrate no significant findings. Lungs/Pleura: Bibasilar atelectasis. No pleural effusion or pneumothorax. Musculoskeletal: No chest wall mass or suspicious bone lesions identified. CT ABDOMEN PELVIS FINDINGS Hepatobiliary: No focal liver abnormality is seen. Status post cholecystectomy. No biliary dilatation. Pancreas: Unremarkable. No pancreatic ductal dilatation or surrounding inflammatory changes. Spleen: Normal in size without focal abnormality. Adrenals/Urinary Tract: Adrenal glands are unremarkable. Kidneys are normal, without renal calculi, focal lesion, or hydronephrosis. Bladder is unremarkable. Stomach/Bowel: Stomach is within normal limits. Appendix appears normal. No evidence of bowel wall thickening, distention, or inflammatory changes. Vascular/Lymphatic: Infrarenal IVC filter. Calcific aortic atherosclerosis. No lymphadenopathy. Reproductive: Prostate is unremarkable. Other: Small fat containing ventral abdominal hernia. Musculoskeletal: No acute or significant osseous findings. IMPRESSION: 1. No acute abnormality of the chest, abdomen or pelvis. Aortic Atherosclerosis (ICD10-I70.0). Electronically Signed   By: KUlyses JarredM.D.   On: 06/28/2021 01:54    EKG: I independently viewed the EKG done and my findings are  as followed: Atrial flutter with 2:1 block at a rate of 107 bpm with QTc 558 ms  Assessment/Plan Present on Admission:  HCAP (healthcare-associated pneumonia)  Hypotension  Diarrhea, unspecified  Atrial fibrillation and flutter ([place]  Principal Problem:   HCAP (healthcare-associated pneumonia) Active Problems:   Macrocytic anemia   End-stage renal disease on [place]ialysis ([place]   Hyperglycemia due to diabetes mellitus (HCC)   Hypotension   Lactic acidosis   Atrial fibrillation and flutter (HCC)   Diarrhea, unspecified   Supratherapeutic INR   Prolonged QT interval   Anemia of chronic disease   Elevated troponin   Elevated lipase   Elevated brain natriuretic peptide (BNP) level   GERD (gastroesophageal reflux disease)   Chronic combined systolic and diastolic CHF (congestive heart failure) ([place]    Septic shock possibly secondary to HCAP Patient was intermittently tachypneic and tachycardic, he was hypotensive.  IV hydration was provided without response and patient required peripheral vasopressor to maintain MAP > 65 Lactic acid was elevated at 4.8  Chest x-ray was suggestive of atelectasis vs pneumonia Patient was started on vancomycin and cefepime, we shall continue same at this time with plan to de-escalate/discontinue based on blood culture, sputum culture, urine Legionella and strep pneumo, Procalcitonin was 5.53 Continue Tylenol as needed Continue Mucinex, incentive spirometry, flutter valve   Hypotension Endorsed history of hypertension which is usually worse after dialysis IV hydration (3 L) was provided with transitory soft BP, but this has since returned to hypotensive range, patient will be started on IV pressor (as recommended by intensivist consulted by ED physician) Continue midodrine  Acute diarrhea C. difficile and GI stool panel will be obtained Continue enteric precautions  Lactic acidosis secondary to multifactorial Lactic acid 4.8, IV hydration was  provided in the ED Continue to trend lactic acid  Dehydration IV hydration was provided in the ED  Atrial flutter EKG personally reviewed showed Atrial flutter with 2:1 block at a rate of 107 bpm with QTc 558 ms Patient was not on any rate control medication Warfarin will be temporarily held at this time due to supratherapeutic INR  Prolonged QTc (558 ms) Avoid QT prolonging drugs Magnesium level will be checked Repeat EKG in the morning  Elevated lipase Lipase 79.  CT abdomen and pelvis showed no acute abdominal abnormality  Elevated troponin possibly secondary to type II demand ischemia Troponin x2 -42 > 87; patient denies any chest pain, continue to trend troponin  Supratherapeutic INR INR 3.7; current temporarily held at this time Continue to monitor INR  Elevated BNP rule out acute on chronic combined systolic and diastolic CHF BNP 797, this may be due to patient's kidney status Cardiogram done in November 2020 showed LVEF of 50 to 55% with elevated LA and LV end-diastolic pressure and LV diastolic parameters consistent with G2 DD Patient does not appear fluid overloaded Continue total input/output, daily weights and fluid restriction Continue Cardiac diet  Echocardiogram in the morning   Hyperglycemia secondary to poorly controlled T2DM Continue ISS and hypoglycemic protocol  GERD Continue Protonix  ESRD on HD (TThS) Continue Renvela and Sensipar Last dialysis was on Thursday (2 days ago) Nephrology will be consulted for maintenance dialysis  Anemia of chronic disease/microcytic anemia Macrocytic anemia Continue ferrous sulfate, renal multivitamin tablets Folate and B12 levels will be checked  DVT prophylaxis: SCDs  Code Status: Full code  Family Communication: None at bedside  Disposition Plan:  Patient is from:                        home Anticipated DC to:                   SNF or family members home Anticipated DC date:               2-3  days Anticipated DC barriers:         Patient requires inpatient management due to severity of illness    Consults called:  None  Admission status:  Inpatient    Bernadette Hoit MD [facility]  06/28/2021, 4:41 AM

## 2021-06-29 ENCOUNTER — Encounter (HOSPITAL_COMMUNITY): Payer: Self-pay | Admitting: Internal Medicine

## 2021-06-29 LAB — GLUCOSE, CAPILLARY
Glucose-Capillary: 82 mg/dL (ref 70–99)
Glucose-Capillary: 95 mg/dL (ref 70–99)
Glucose-Capillary: 101 mg/dL — ABNORMAL HIGH (ref 70–99)
Glucose-Capillary: 97 mg/dL (ref 70–99)

## 2021-06-29 LAB — RENAL FUNCTION PANEL
Albumin: 2.7 g/dL — ABNORMAL LOW (ref 3.5–5.0)
Anion gap: 15 (ref 5–15)
BUN: 60 mg/dL — ABNORMAL HIGH (ref 8–23)
CO2: 25 mmol/L (ref 22–32)
Calcium: 8.4 mg/dL — ABNORMAL LOW (ref 8.9–10.3)
Chloride: 95 mmol/L — ABNORMAL LOW (ref 98–111)
Creatinine, Ser: 9.4 mg/dL — ABNORMAL HIGH (ref 0.61–1.24)
GFR, Estimated: 6 mL/min — ABNORMAL LOW (ref >60)
Glucose, Bld: 92 mg/dL (ref 70–99)
Phosphorus: 3.9 mg/dL (ref 2.5–4.6)
Potassium: 4.3 mmol/L (ref 3.5–5.1)
Sodium: 135 mmol/L (ref 135–145)

## 2021-06-29 LAB — PROTIME-INR
INR: 3.7 — ABNORMAL HIGH (ref 0.8–1.2)
Prothrombin Time: 36.7 seconds — ABNORMAL HIGH (ref 11.4–15.2)

## 2021-06-29 MED ORDER — CHLORHEXIDINE GLUCONATE CLOTH 2 % EX PADS
6.0000 | MEDICATED_PAD | Freq: Every day | CUTANEOUS | Status: DC
  Administered 2021-06-30: 6 via TOPICAL

## 2021-06-29 MED ORDER — SODIUM CHLORIDE 0.9 % IV SOLN: 100.0000 mL | INTRAVENOUS | Status: DC | PRN

## 2021-06-29 MED ORDER — LIDOCAINE-PRILOCAINE 2.5-2.5 % EX CREA: 1.0000 "application " | TOPICAL_CREAM | CUTANEOUS | Status: DC | PRN

## 2021-06-29 MED ORDER — LIDOCAINE HCL (PF) 1 % IJ SOLN: 5.0000 mL | INTRAMUSCULAR | Status: DC | PRN

## 2021-06-29 MED ORDER — PENTAFLUOROPROP-TETRAFLUOROETH EX AERO
1.0000 "application " | INHALATION_SPRAY | CUTANEOUS | Status: DC | PRN
  Filled 2021-06-29: qty 30

## 2021-06-29 NOTE — Progress Notes (Signed)
ANTICOAGULATION CONSULT NOTE - Initial Up Consult   Pharmacy Consult for warfarin dosing  Indication: atrial fibrillation   No Known Allergies    Patient Measurements: Last Weight  Most recent update: 06/29/2021  6:53 AM    Weight  115.6 kg (254 lb 13.6 oz)            Body mass index is 31.85 kg/m. [name]               Temp: 97.3 F (36.3 C) (01/29 0730) Temp Source: Oral (01/29 0730) BP: 118/58 (01/29 0600)  Labs: Recent Labs    06/27/21 2200 06/28/21 0734 06/29/21 0457 06/29/21 0458  HGB 13.8 14.0  --   --   HCT 43.9 43.2  --   --   PLT 122* 126*  --   --   LABPROT 36.9* 34.8* 36.7*  --   INR 3.7* 3.5* 3.7*  --   CREATININE 7.28* 7.60*  --  9.40*     Estimated Creatinine Clearance: 10.3 mL/min (A) (by C-G formula based on SCr of 9.4 mg/dL (H)).     Medications:  Medications Prior to Admission  Medication Sig Dispense Refill Last Dose   acetaminophen (TYLENOL) 500 MG tablet Take 500-1,000 mg by mouth daily as needed (back pain).      B Complex-C-Folic Acid (RENAL MULTIVITAMIN FORMULA) TABS Take 1 tablet by mouth daily. Renal Tab II      Cholecalciferol (VITAMIN D3) 125 MCG (5000 UT) TABS Take 5,000 Units by mouth daily.      cinacalcet (SENSIPAR) 30 MG tablet SMARTSIG:1 Tablet(s) By Mouth Every Evening      doxercalciferol (HECTOROL) 4 MCG/2ML injection Inject 2.5 mLs (5 mcg total) into the vein Every Tuesday,Thursday,and Saturday with dialysis. 2 mL     enoxaparin (LOVENOX) 120 MG/0.8ML injection Inject 120 mg into the skin every 12 (twelve) hours.      ferrous sulfate 325 (65 FE) MG tablet Take 325 mg by mouth daily with breakfast.      insulin aspart (NOVOLOG) 100 UNIT/ML injection Inject 1-9 Units into the skin 3 (three) times daily before meals.      lidocaine-prilocaine (EMLA) cream Apply 1 application topically as needed (Apply small amount to access site 1-2 hours before dialysis. Cover with occlusive dressing (saran wrap)).       Multiple  Vitamins-Minerals (MULTIVITAMIN WITH MINERALS) tablet Take 1 tablet by mouth daily.      multivitamin (RENA-VIT) TABS tablet Take 1 tablet by mouth daily.      oxyCODONE-acetaminophen (PERCOCET) 5-325 MG tablet Take 1 tablet by mouth every 6 (six) hours as needed for severe pain. 8 tablet 0    pantoprazole (PROTONIX) 40 MG tablet Take 1 tablet (40 mg total) by mouth 2 (two) times daily. 60 tablet 1    sevelamer carbonate (RENVELA) 800 MG tablet Take 800 mg by mouth 3 (three) times daily with meals.      vitamin C (ASCORBIC ACID) 500 MG tablet Take 500 mg by mouth daily.      warfarin (COUMADIN) 5 MG tablet Take 7.5 mg by mouth daily.      zinc sulfate 220 (50 Zn) MG capsule Take 220 mg by mouth daily.      Scheduled:   Chlorhexidine Gluconate Cloth  6 each Topical Daily   cinacalcet  30 mg Oral Q supper   dextromethorphan-guaiFENesin  1 tablet Oral BID   doxycycline  100 mg Oral Q12H   ferrous sulfate  325 mg  Oral Q breakfast   insulin aspart  0-5 Units Subcutaneous QHS   insulin aspart  0-6 Units Subcutaneous TID WC   midodrine  10 mg Oral TID WC   multivitamin  1 tablet Oral Daily   pantoprazole  40 mg Oral BID   sevelamer carbonate  800 mg Oral TID WC   Infusions:   sodium chloride Stopped (06/28/21 2326)   ceFEPime (MAXIPIME) IV Stopped (06/28/21 2159)   norepinephrine (LEVOPHED) Adult infusion 2 mcg/min (06/29/21 0801)   vancomycin     PRN: acetaminophen **OR** acetaminophen, acetaminophen Anti-infectives (From admission, onward)    Start     Dose/Rate Route Frequency Ordered Stop   06/28/21 2200  ceFEPIme (MAXIPIME) 1 g in sodium chloride 0.9 % 100 mL IVPB        1 g 200 mL/hr over 30 Minutes Intravenous Every 24 hours 06/28/21 0457     06/28/21 1200  vancomycin (VANCOCIN) IVPB 1000 mg/200 mL premix        1,000 mg 200 mL/hr over 60 Minutes Intravenous Every T-Th-Sa (Hemodialysis) 06/28/21 0457     06/28/21 1100  doxycycline (VIBRA-TABS) tablet 100 mg        100 mg Oral  Every 12 hours 06/28/21 1014     06/28/21 0230  vancomycin (VANCOREADY) IVPB 2000 mg/400 mL        2,000 mg 200 mL/hr over 120 Minutes Intravenous  Once 06/28/21 0225 06/28/21 0501   06/28/21 0230  ceFEPIme (MAXIPIME) 2 g in sodium chloride 0.9 % 100 mL IVPB        2 g 200 mL/hr over 30 Minutes Intravenous  Once 06/28/21 0225 06/28/21 0340       Goal of Therapy:  INR 2-3 Monitor platelets by anticoagulation protocol: Yes    Prior to Admission Warfarin Dosing:  [name] takes 7.5mg  of warfarin daily       Admit INR was 3.7 Lab Results  Component Value Date   INR 3.7 (H) 06/29/2021   INR 3.5 (H) 06/28/2021   INR 3.7 (H) 06/27/2021    Assessment: [name] a 71 y.o. male requires anticoagulation with warfarin for the indication of  atrial fibrillation. Warfarin will be initiated inpatient following pharmacy protocol per pharmacy consult. Patient most recent blood work is as follows: CBC Latest Ref Rng & Units 06/28/2021 06/27/2021 11/06/2020  WBC 4.0 - 10.5 K/uL 4.9 6.8 -  Hemoglobin 13.0 - 17.0 g/dL 14.0 13.8 10.9(L)  Hematocrit 39.0 - 52.0 % 43.2 43.9 32.0(L)  Platelets 150 - 400 K/uL 126(L) 122(L) -   INR 3.7, supratherapeutic  Plan: Hold warfarin x 1 dose tonight  Monitor CBC MWF with am labs   Monitor INR daily Monitor for signs and symptoms of bleeding   Donna Christen Melodi Happel, PharmD, MBA, BCGP Clinical Pharmacist

## 2021-06-29 NOTE — Progress Notes (Signed)
PROGRESS NOTE  [name] YIR:[id number] DOB: [birth year] DOA: 06/27/2021 PCP: Burnard Bunting, MD     Brief History:  71 year old male with a history of ESRD (TTS), chronic atrial fibrillation/atrial flutter, DVT right leg, diabetes mellitus type 2, hypertension, hyperlipidemia, NHL in remission, COPD, systolic CHF presenting with 2-day history of shortness of breath, generalized abdominal pain, and diarrhea that began on the evening of 06/26/2020 after dialysis.  The patient states that he had been in usual state of health until after his dialysis on 06/26/2020.  He states that he has been compliant with dialysis testing the whole time.  He states that his only new medication is midodrine which he started 2 weeks ago.  He states that he only takes it on dialysis days.  He denies any recent antibiotics.  He denies any fevers, chills, headache, neck pain, coughing, hemoptysis, chest pain.  He denies any worsening peripheral edema.  He states that his abdominal pain was associated with liquid stools of which he had 5-6 on 06/27/2021.  There is no hematochezia, melena.  He states that his pain was constant with intermittent cramping.  He denies any recent antibiotics.  There is been no sick contacts or unusual or undercooked foods.  He had some nausea without any emesis.  He endorses compliance with all his medications. In the ED, the patient was afebrile and hypotensive with systolic blood pressure in the 70s.  He was given 3 L of fluid but remained hypotensive.  He was started on Levophed.  Oxygen saturation was 92% room air.  He was placed on 2 L supplemental with saturation up to 99%.  He was initially tachycardic 110-120s.  Patient was started on vancomycin and cefepime.  However the second blood culture was obtained after the patient has been started on antibiotics.  Chest x-ray showed L>R bibasilar opacities.  CT chest, abdomen, pelvis was negative for any acute findings.  Notably, there was  no splenomegaly or pancreatic or bowel inflammatory changes or bili ductal dilatation.  There was an IVC filter.  The patient was admitted for further treatment and evaluation of his sepsis.  COVID-19 PCR and influenza PCR negative   Assessment/Plan: Septic shock -Patient presented with tachycardia, hypotension refractory to fluid resuscitation -Lactic acid 4.8>>greater than 9 -Continue Levophed>> wean for SBP greater than 90 -Levophed 6 mcg>>2 mcg -At baseline, the patient takes midodrine for soft blood pressures -Continue midodrine -Follow blood cultures--neg -PCT 5.53 -am cortisol   Lobar pneumonia -Continue empiric cefepime -continue empiric doxycycline -MRSA screen--neg -d/c vanc   Lactic acidosis -Check VBG--7.323/55/<31/24 -Secondary to sepsis and volume depletion -No signs of ischemic colitis or enteritis on CT abdomen   Diarrhea -Stool pathogen panel--no diarrhea to collect -Check C. Difficile--no diarrhea to collect   ESRD -He is TTS, -last HD on 06/26/21 -Nephrology consult appreciated -plan HD 1/29 -Continue Renvela and Sensipar   Diabetes mellitus type 2 -NovoLog sliding scale -1/28 Hemoglobin A1c--8.1   Chronic atrial fibrillation/a flutter -Continue warfarin -Currently rate controlled   GERD -Continue pantoprazole   Supratherapeutic INR -Daily INR -Pharmacy to assist with Coumadin dosing -Monitor for active signs of bleeding   Chronic systolic CHF due to NICM -Echo 04/2019 with normalization of his EF.  -E[facility]07/[facility] showed EF remains >55% -GDMT limited somewhat from ESRD   Elevated lipase -no pancreatic inflammation on CT abd -likely due to ESRD   Elevated troponin -no chest pain -EKG unchanged from  prior with RBBB -due to ESRD and demand ischemia       Family Communication:   son updated at bedside 1/28   Consultants:  renal   Code Status:  FULL    DVT Prophylaxis:  warfarin     Procedures: As Listed in Progress  Note Above   Antibiotics: Vanc 1/28>> Cefepime 1/28>>    Subjective: Had one loose stool in last 24 hours.  Denies f/c, cp, n/v/d.  Had some abd cramping after eating>>then one loose stool  Objective: Vitals:   06/29/21 1235 06/29/21 1300 06/29/21 1400 06/29/21 1500  BP: (!) 142/58 (!) 162/52 (!) 100/16 (!) 137/39  Pulse:      Resp: 14 17 19 17   Temp:      TempSrc:      SpO2:      Weight:      Height:        Intake/Output Summary (Last 24 hours) at 06/29/2021 1558 Last data filed at 06/29/2021 1404 Gross per 24 hour  Intake 291.48 ml  Output --  Net 291.48 ml   Weight change: 2.969 kg Exam:  General:  Pt is alert, follows commands appropriately, not in acute distress HEENT: No icterus, No thrush, No neck mass, [place]/AT Cardiovascular: RRR, S1/S2, no rubs, no gallops Respiratory: fine bibasilar rales. No wheeze Abdomen: Soft/+BS, non tender, non distended, no guarding Extremities: No edema, No lymphangitis, No petechiae, No rashes, no synovitis   Data Reviewed: I have personally reviewed following labs and imaging studies Basic Metabolic Panel: Recent Labs  Lab 06/27/21 2200 06/28/21 0734 06/28/21 0736 06/29/21 0458  NA 138 134*  --  135  K 3.9 4.1  --  4.3  CL 97* 95*  --  95*  CO2 22 25  --  25  GLUCOSE 234* 164*  --  92  BUN 46* 50*  --  60*  CREATININE 7.28* 7.60*  --  9.40*  CALCIUM 8.3* 8.3*  --  8.4*  MG  --   --  2.3  --   PHOS  --   --  2.7 3.9   Liver Function Tests: Recent Labs  Lab 06/27/21 2200 06/28/21 0734 06/29/21 0458  AST 43* 43*  --   ALT 41 44  --   ALKPHOS 115 124  --   BILITOT 0.7 0.6  --   PROT 5.5* 6.8  --   ALBUMIN 2.8* 3.2* 2.7*   Recent Labs  Lab 06/27/21 2200  LIPASE 79*   No results for input(s): AMMONIA in the last 168 hours. Coagulation Profile: Recent Labs  Lab 06/27/21 2200 06/28/21 0734 06/29/21 0457  INR 3.7* 3.5* 3.7*   CBC: Recent Labs  Lab 06/27/21 2200 06/28/21 0734  WBC 6.8 4.9  HGB 13.8  14.0  HCT 43.9 43.2  MCV 103.5* 104.1*  PLT 122* 126*   Cardiac Enzymes: No results for input(s): CKTOTAL, CKMB, CKMBINDEX, TROPONINI in the last 168 hours. BNP: Invalid input(s): POCBNP CBG: Recent Labs  Lab 06/28/21 1140 06/28/21 1631 06/28/21 2145 06/29/21 0745 06/29/21 1130  GLUCAP 144* 149* 137* 82 95   HbA1C: Recent Labs    06/28/21 0734  HGBA1C 8.1*   Urine analysis:    Component Value Date/Time   COLORURINE YELLOW 08/04/2013 [place] 08/04/2013 0249   LABSPEC 1.013 08/04/2013 0249   PHURINE 7.5 08/04/2013 0249   GLUCOSEU 500 (A) 08/04/2013 0249   HGBUR SMALL (A) 08/04/2013 [place] NEGATIVE 08/04/2013 0249   Benjamin Stain  NEGATIVE 08/04/2013 0249   PROTEINUR 100 (A) 08/04/2013 0249   UROBILINOGEN 0.2 08/04/2013 0249   NITRITE NEGATIVE 08/04/2013 0249   LEUKOCYTESUR NEGATIVE 08/04/2013 0249   Sepsis Labs: @LABRCNTIP (procalcitonin:4,lacticidven:4) ) Recent Results (from the past 240 hour(s))  Resp Panel by RT-PCR (Flu A&B, Covid) Nasopharyngeal Swab     Status: None   Collection Time: 06/27/21  8:42 PM   Specimen: Nasopharyngeal Swab; Nasopharyngeal(NP) swabs in vial transport medium  Result Value Ref Range Status   SARS Coronavirus 2 by RT PCR NEGATIVE NEGATIVE Final    Comment: (NOTE) SARS-CoV-2 target nucleic acids are NOT DETECTED.  The SARS-CoV-2 RNA is generally detectable in upper respiratory specimens during the acute phase of infection. The lowest concentration of SARS-CoV-2 viral copies this assay can detect is 138 copies/mL. A negative result does not preclude SARS-Cov-2 infection and should not be used as the sole basis for treatment or other patient management decisions. A negative result may occur with  improper specimen collection/handling, submission of specimen other than nasopharyngeal swab, presence of viral mutation(s) within the areas targeted by this assay, and inadequate number of viral copies(<138  copies/mL). A negative result must be combined with clinical observations, patient history, and epidemiological information. The expected result is Negative.  Fact Sheet for Patients:  EntrepreneurPulse.com.au  Fact Sheet for Healthcare Providers:  IncredibleEmployment.be  This test is no t yet approved or cleared by the [place] FDA and  has been authorized for detection and/or diagnosis of SARS-CoV-2 by FDA under an Emergency Use Authorization (EUA). This EUA will remain  in effect (meaning this test can be used) for the duration of the COVID-19 declaration under Section 564(b)(1) of the Act, 21 U.S.C.section 360bbb-3(b)(1), unless the authorization is terminated  or revoked sooner.       Influenza A by PCR NEGATIVE NEGATIVE Final   Influenza B by PCR NEGATIVE NEGATIVE Final    Comment: (NOTE) The Xpert Xpress SARS-CoV-2/FLU/RSV plus assay is intended as an aid in the diagnosis of influenza from Nasopharyngeal swab specimens and should not be used as a sole basis for treatment. Nasal washings and aspirates are unacceptable for Xpert Xpress SARS-CoV-2/FLU/RSV testing.  Fact Sheet for Patients: EntrepreneurPulse.com.au  Fact Sheet for Healthcare Providers: IncredibleEmployment.be  This test is not yet approved or cleared by the [place] FDA and has been authorized for detection and/or diagnosis of SARS-CoV-2 by FDA under an Emergency Use Authorization (EUA). This EUA will remain in effect (meaning this test can be used) for the duration of the COVID-19 declaration under Section 564(b)(1) of the Act, 21 U.S.C. section 360bbb-3(b)(1), unless the authorization is terminated or revoked.  Performed at [facility], [address], [place], [place] [address]   Culture, blood (Routine X 2) w Reflex to ID Panel     Status: None (Preliminary result)   Collection Time: 06/28/21  1:04 AM   Specimen:  BLOOD LEFT HAND  Result Value Ref Range Status   Specimen Description BLOOD LEFT HAND  Final   Special Requests   Final    BOTTLES DRAWN AEROBIC ONLY Blood Culture adequate volume   Culture   Final    NO GROWTH 1 DAY Performed at [facility], [address], [place], [place] [address]    Report Status PENDING  Incomplete  Culture, blood (Routine X 2) w Reflex to ID Panel     Status: None (Preliminary result)   Collection Time: 06/28/21  3:15 AM   Specimen: BLOOD LEFT ARM  Result Value  Ref Range Status   Specimen Description BLOOD LEFT ARM  Final   Special Requests   Final    BOTTLES DRAWN AEROBIC AND ANAEROBIC Blood Culture adequate volume   Culture   Final    NO GROWTH 1 DAY Performed at [facility], [address], [place], [place] [address]    Report Status PENDING  Incomplete  MRSA Next Gen by PCR, Nasal     Status: None   Collection Time: 06/28/21  8:33 AM   Specimen: Nasal Mucosa; Nasal Swab  Result Value Ref Range Status   MRSA by PCR Next Gen NOT DETECTED NOT DETECTED Final    Comment: (NOTE) The GeneXpert MRSA Assay (FDA approved for NASAL specimens only), is one component of a comprehensive MRSA colonization surveillance program. It is not intended to diagnose MRSA infection nor to guide or monitor treatment for MRSA infections. Test performance is not FDA approved in patients less than 3 years old. Performed at [facility], [address], [place], [place] [address]      Scheduled Meds:  Chlorhexidine Gluconate Cloth  6 each Topical Daily   [START ON 06/30/2021] Chlorhexidine Gluconate Cloth  6 each Topical Q0600   cinacalcet  30 mg Oral Q supper   dextromethorphan-guaiFENesin  1 tablet Oral BID   doxycycline  100 mg Oral Q12H   ferrous sulfate  325 mg Oral Q breakfast   insulin aspart  0-5 Units Subcutaneous QHS   insulin aspart  0-6 Units Subcutaneous TID WC   midodrine  10 mg Oral TID WC   multivitamin  1 tablet Oral Daily   pantoprazole  40 mg Oral BID    sevelamer carbonate  800 mg Oral TID WC   Continuous Infusions:  sodium chloride Stopped (06/28/21 2326)   ceFEPime (MAXIPIME) IV Stopped (06/28/21 2159)   norepinephrine (LEVOPHED) Adult infusion 2 mcg/min (06/29/21 1404)    Procedures/Studies: DG Chest 1 View  Result Date: 06/27/2021 CLINICAL DATA:  Shortness of breath, vomiting EXAM: CHEST  1 VIEW COMPARISON:  07/16/2018 FINDINGS: There is poor inspiration. There are patchy linear densities in both lower lung fields, more so on the left side. Costophrenic angles are clear. There is no pneumothorax. There is mild extrinsic pressure over the left lateral margin of trachea in the lower neck suggesting possible enlarged thyroid. IMPRESSION: There linear patchy densities in both lower lung fields, more so on the left side. Findings suggest possible atelectasis/pneumonia. There is poor inspiration which could account for some of the crowding of markings in the lower lung fields. There is no significant pleural effusion or pneumothorax. Electronically Signed   By: Elmer Picker [name]   On: 06/27/2021 20:38   CT CHEST ABDOMEN PELVIS W CONTRAST  Result Date: 06/28/2021 CLINICAL DATA:  Abdominal pain and dyspnea EXAM: CT CHEST, ABDOMEN, AND PELVIS WITH CONTRAST TECHNIQUE: Multidetector CT imaging of the chest, abdomen and pelvis was performed following the standard protocol during bolus administration of intravenous contrast. RADIATION DOSE REDUCTION: This exam was performed according to the departmental dose-optimization program which includes automated exposure control, adjustment of the mA and/or kV according to patient size and/or use of iterative reconstruction technique. CONTRAST:  193mL OMNIPAQUE IOHEXOL 350 MG/ML SOLN COMPARISON:  None. FINDINGS: CT CHEST FINDINGS Cardiovascular: Calcific aortic atherosclerosis and coronary artery calcification. No pericardial effusion. Mediastinum/Nodes: No enlarged mediastinal, hilar, or axillary lymph  nodes. Thyroid gland, trachea, and esophagus demonstrate no significant findings. Lungs/Pleura: Bibasilar atelectasis. No pleural effusion or pneumothorax. Musculoskeletal: No chest wall mass  or suspicious bone lesions identified. CT ABDOMEN PELVIS FINDINGS Hepatobiliary: No focal liver abnormality is seen. Status post cholecystectomy. No biliary dilatation. Pancreas: Unremarkable. No pancreatic ductal dilatation or surrounding inflammatory changes. Spleen: Normal in size without focal abnormality. Adrenals/Urinary Tract: Adrenal glands are unremarkable. Kidneys are normal, without renal calculi, focal lesion, or hydronephrosis. Bladder is unremarkable. Stomach/Bowel: Stomach is within normal limits. Appendix appears normal. No evidence of bowel wall thickening, distention, or inflammatory changes. Vascular/Lymphatic: Infrarenal IVC filter. Calcific aortic atherosclerosis. No lymphadenopathy. Reproductive: Prostate is unremarkable. Other: Small fat containing ventral abdominal hernia. Musculoskeletal: No acute or significant osseous findings. IMPRESSION: 1. No acute abnormality of the chest, abdomen or pelvis. Aortic Atherosclerosis (ICD10-I70.0). Electronically Signed   By: Ulyses Jarred [name]   On: 06/28/2021 01:54   ECHOCARDIOGRAM COMPLETE  Result Date: 06/28/2021    ECHOCARDIOGRAM REPORT   Patient Name:   [name] Date of Exam: 06/28/2021 Medical Rec #:  [id number]     Height:       75.0 in Accession #:    [id number]    Weight:       251.5 lb Date of Birth:  1951-02-26    BSA:          2.419 m Patient Age:    69 years      BP:           136/45 mmHg Patient Gender: M             HR:           67 bpm. Exam Location:  [facility] Procedure: 2D Echo, Cardiac Doppler and Color Doppler Indications:    CHF-Acute Diastolic T73.22  History:        Patient has prior history of Echocardiogram examinations, most                 recent 04/07/2019. COPD, Arrythmias:Atrial Fibrillation and                 Atrial Flutter;  Risk Factors:Hypertension, Diabetes,                 Dyslipidemia and Sleep Apnea.  Sonographer:    Bernadene Person RDCS Referring Phys: [id number] OLADAPO ADEFESO IMPRESSIONS  1. Left ventricular ejection fraction, by estimation, is 60 to 65%. The left ventricle has normal function. The left ventricle has no regional wall motion abnormalities. There is moderate asymmetric left ventricular hypertrophy of the septal segment. Left ventricular diastolic parameters are indeterminate.  2. Ventricular septum is flattened in systole suggesting RV pressure overload. . Right ventricular systolic function was not well visualized. The right ventricular size is not well visualized. There is moderately elevated pulmonary artery systolic pressure.  3. The mitral valve is normal in structure. No evidence of mitral valve regurgitation. No evidence of mitral stenosis.  4. The aortic valve is tricuspid. There is moderate calcification of the aortic valve. There is moderate thickening of the aortic valve. Aortic valve regurgitation is not visualized. No aortic stenosis is present.  5. Aortic dilatation noted. There is mild dilatation of the ascending aorta, measuring 40 mm.  6. The inferior vena cava is dilated in size with >50% respiratory variability, suggesting right atrial pressure of 8 mmHg. FINDINGS  Left Ventricle: Left ventricular ejection fraction, by estimation, is 60 to 65%. The left ventricle has normal function. The left ventricle has no regional wall motion abnormalities. The left ventricular internal cavity size was normal in size. There is  moderate asymmetric left ventricular  hypertrophy of the septal segment. Left ventricular diastolic parameters are indeterminate. Right Ventricle: Ventricular septum is flattened in systole suggesting RV pressure overload. The right ventricular size is not well visualized. Right vetricular wall thickness was not well visualized. Right ventricular systolic function was not well  visualized. There is moderately elevated pulmonary artery systolic pressure. The tricuspid regurgitant velocity is 3.20 m/s, and with an assumed right atrial pressure of 8 mmHg, the estimated right ventricular systolic pressure is 76.1 mmHg. Left Atrium: Left atrial size was normal in size. Right Atrium: Right atrial size was normal in size. Pericardium: There is no evidence of pericardial effusion. Mitral Valve: The mitral valve is normal in structure. There is mild thickening of the mitral valve leaflet(s). There is mild calcification of the mitral valve leaflet(s). Mild mitral annular calcification. No evidence of mitral valve regurgitation. No evidence of mitral valve stenosis. Tricuspid Valve: The tricuspid valve is not well visualized. Tricuspid valve regurgitation is mild . No evidence of tricuspid stenosis. Aortic Valve: The aortic valve is tricuspid. There is moderate calcification of the aortic valve. There is moderate thickening of the aortic valve. There is mild aortic valve annular calcification. Aortic valve regurgitation is not visualized. No aortic stenosis is present. Aortic valve mean gradient measures 4.4 mmHg. Aortic valve peak gradient measures 9.0 mmHg. Aortic valve area, by VTI measures 2.80 cm. Pulmonic Valve: The pulmonic valve was not well visualized. Pulmonic valve regurgitation is not visualized. No evidence of pulmonic stenosis. Aorta: Aortic dilatation noted and the aortic root is normal in size and structure. There is mild dilatation of the ascending aorta, measuring 40 mm. Venous: The inferior vena cava is dilated in size with greater than 50% respiratory variability, suggesting right atrial pressure of 8 mmHg. IAS/Shunts: No atrial level shunt detected by color flow Doppler.  LEFT VENTRICLE PLAX 2D LVIDd:         4.40 cm LVIDs:         2.90 cm LV PW:         1.10 cm LV IVS:        1.40 cm LVOT diam:     2.30 cm LV SV:         76 LV SV Index:   31 LVOT Area:     4.15 cm  LV Volumes  (MOD) LV vol d, MOD A2C: 96.2 ml LV vol d, MOD A4C: 96.1 ml LV vol s, MOD A2C: 39.7 ml LV vol s, MOD A4C: 42.6 ml LV SV MOD A2C:     56.5 ml LV SV MOD A4C:     96.1 ml LV SV MOD BP:      53.0 ml RIGHT VENTRICLE RV S prime:     6.89 cm/s TAPSE (M-mode): 2.2 cm LEFT ATRIUM             Index        RIGHT ATRIUM           Index LA diam:        4.00 cm 1.65 cm/m   RA Area:     18.90 cm LA Vol (A2C):   63.1 ml 26.09 ml/m  RA Volume:   49.70 ml  20.55 ml/m LA Vol (A4C):   58.1 ml 24.02 ml/m LA Biplane Vol: 64.9 ml 26.83 ml/m  AORTIC VALVE AV Area (Vmax):    2.69 cm AV Area (Vmean):   2.62 cm AV Area (VTI):     2.80 cm AV Vmax:  149.62 cm/s AV Vmean:          98.265 cm/s AV VTI:            0.270 m AV Peak Grad:      9.0 mmHg AV Mean Grad:      4.4 mmHg LVOT Vmax:         96.83 cm/s LVOT Vmean:        61.867 cm/s LVOT VTI:          0.182 m LVOT/AV VTI ratio: 0.67  AORTA Ao Root diam: 3.90 cm Ao Asc diam:  4.00 cm TRICUSPID VALVE TR Peak grad:   41.0 mmHg TR Vmax:        320.00 cm/s  SHUNTS Systemic VTI:  0.18 m Systemic Diam: 2.30 cm Carlyle Dolly MD Electronically signed by Carlyle Dolly MD Signature Date/Time: 06/28/2021/3:34:40 PM    Final     Orson Eva, DO  [facility]  If 7PM-7AM, please contact night-coverage [email or web address] Password [email or web address] 06/29/2021, 3:58 PM   LOS: 1 day

## 2021-06-29 NOTE — Procedures (Signed)
° °  HEMODIALYSIS TREATMENT NOTE:  Uneventful 3.5 hour heparin-free session completed using right upper arm AVF.  Goal met: 1.5 liters removed.  All blood was returned.   Infectious Diseases Result Type Result Value Relevant Reference Range Interpretation Date  Hep B Surface Ab (anti-HBs) 36 mIU/mL < 10 mIU/mL, Non- Immune  - June 19, 2021  Hep B Surface Ag (HBsAg) Negative Negative - June 19, 2021  HCV Ab (anti-HCV) Nonreactive Non-Reactive - June 19, 2021      Rockwell Alexandria, RN

## 2021-06-29 NOTE — Consult Note (Signed)
Reason for Consult: To manage dialysis and dialysis related needs Referring Physician: Tat  KAMARE [name] is an 71 y.o. male with T2DM, combined heart failure with Afib as well as ESRD- TTS at [facility]-  dialysis associated hypotension.  He presented to the ER on 1/28 with c/o abdominal pain, diarrhea and SOB.  He was found to be hypotensive with an elevated lactate- abd CT was negative but pulm CT showing possible PNA- he was fluid resuscitated but still hypotensive requiring some pressors-  also placed on cefepime-  overnightBP has stabilized.  We were called to provide his routine HD-  yesterday I felt he might be too unstable so have put it off to today.  He says he feels a lot better-  they are weaning the norepi- weight is up to 115 but he does not have any edema   Dialyzes at [facility] TTS 4 hours and 15 minutes   EDW 110.5. HD Bath 2/2.5, Dialyzer 250, Heparin none. Access AVF-  15 guage. Also low temp and profile #2   left out 0.6 over EDW on 1/26  last hgb 13.3  Past Medical History:  Diagnosis Date   Anemia    Arthritis    HNP- lumbar, "all over my body"   Atrial fibrillation and flutter ([place])    Blood transfusion    "years ago; blood was low" (08/05/2013)   COPD (chronic obstructive pulmonary disease) (HCC)    Diabetic nephropathy (HCC)    Diabetic retinopathy    DVT (deep venous thrombosis) ([place])    "got one in my right leg now; I've had one before too, not sure which leg" (08/05/2013)   ESRD (end stage renal disease) on dialysis ([place])    TTS, [facility]   Family history of anesthesia complication    " my son wakes up slowly"   GERD (gastroesophageal reflux disease)    uses alka seltzere on occas.    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 unsure of several answers to health history questions    Septic shock ([place])    Skin cancer    melanoma - head   Sleep apnea    "suppose to have a sleep study, but they never told me when. (08/05/2013)    Past Surgical History:  Procedure Laterality Date   ACHILLES TENDON SURGERY Right 03/13/2016   Procedure: ACHILLES LENGTHENING/KIDNER;  Surgeon: Edrick Kins, DPM;  Location: [facility];  Service: Podiatry;  Laterality: Right;   AV FISTULA PLACEMENT Left 02/03/2013   Procedure: ARTERIOVENOUS (AV) FISTULA CREATION- LEFT RADIAL CEPHALIC; ULTRASOUND GUIDED;  Surgeon: Mal Misty, MD;  Location: [facility];  Service: Vascular;  Laterality: Left;   AV FISTULA PLACEMENT Right 11/08/2015   Procedure: RIGHT BRACHIOCEPHALIC ARTERIOVENOUS (AV) FISTULA CREATION;  Surgeon: Serafina Mitchell, MD;  Location: [facility];  Service: Vascular;  Laterality: Right;   [place] Right 01/24/2016   Procedure: RIGHT SECOND STAGE BASILIC VEIN TRANSPOSITION;  Surgeon: Angelia Mould, MD;  Location: [facility];  Service: Vascular;  Laterality: Right;  BIOPSY  05/17/2020   Procedure: BIOPSY;  Surgeon: Rush Landmark Telford Nab., MD;  Location: [facility];  Service: Gastroenterology;;   CARDIAC CATHETERIZATION     CHOLECYSTECTOMY N/A 05/18/2020   Procedure: LAPAROSCOPIC CHOLECYSTECTOMY WITH INTRAOPERATIVE CHOLANGIOGRAM;  Surgeon: [name], MD;  Location: [facility];  Service: General;  Laterality: N/A;   COLONOSCOPY N/A 09/23/2015   Procedure: COLONOSCOPY;  Surgeon: Irene Shipper, MD;  Location: [facility];  Service: Endoscopy;  Laterality: N/A;   Coloscopy     ENDOSCOPIC RETROGRADE CHOLANGIOPANCREATOGRAPHY (ERCP) WITH PROPOFOL N/A 05/17/2020   Procedure: ENDOSCOPIC RETROGRADE CHOLANGIOPANCREATOGRAPHY (ERCP) WITH PROPOFOL;  Surgeon:  Rush Landmark Telford Nab., MD;  Location: [facility];  Service: Gastroenterology;  Laterality: N/A;   ESOPHAGOGASTRODUODENOSCOPY (EGD) WITH PROPOFOL N/A 05/22/2020   Procedure: ESOPHAGOGASTRODUODENOSCOPY (EGD) WITH PROPOFOL;  Surgeon: Gatha Mayer, MD;  Location: [facility];  Service: Endoscopy;  Laterality: N/A;   EYE SURGERY Bilateral    GAS INSERTION  05/10/2012   Procedure: INSERTION OF GAS;  Surgeon: Hayden Pedro, MD;  Location: [facility];  Service: Ophthalmology;  Laterality: Right;   LEFT HEART CATH AND CORONARY ANGIOGRAPHY N/A 09/11/2016   Procedure: Left Heart Cath and Coronary Angiography;  Surgeon: Belva Crome, MD;  Location: [facility];  Service: Cardiovascular;  Laterality: N/A;   LESION EXCISION Right 05/08/2015   Procedure: EXCISION SCALP LESION;  Surgeon: Erroll Luna, MD;  Location: [facility];  Service: General;  Laterality: Right;   MEMBRANE PEEL  05/10/2012   Procedure: MEMBRANE PEEL;  Surgeon: Hayden Pedro, MD;  Location: [facility];  Service: Ophthalmology;  Laterality: Right;   PARS PLANA VITRECTOMY  08/27/2011   Procedure: PARS PLANA VITRECTOMY WITH 25 GAUGE;  Surgeon: Hayden Pedro, MD;  Location: [facility];  Service: Ophthalmology;  Laterality: Left;  Repair of complex traction retinal detachment left eye   PARS PLANA VITRECTOMY  05/10/2012   Procedure: PARS PLANA VITRECTOMY WITH 25 GAUGE;  Surgeon: Hayden Pedro, MD;  Location: [facility];  Service: Ophthalmology;  Laterality: Right;  Repair Complex Traction Retinal Detachment   PHOTOCOAGULATION WITH LASER  05/10/2012   Procedure: PHOTOCOAGULATION WITH LASER;  Surgeon: Hayden Pedro, MD;  Location: [facility];  Service: Ophthalmology;  Laterality: Right;   PORT-A-CATH REMOVAL     PORTACATH PLACEMENT     REMOVAL OF STONES  05/17/2020   Procedure: REMOVAL OF STONES;  Surgeon: Rush Landmark Telford Nab., MD;  Location: [facility];  Service: Gastroenterology;;   REVISON OF ARTERIOVENOUS FISTULA Left 07/31/2020   Procedure:  REVISION AND 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;  Surgeon: Rosetta Posner, MD;  Location: [facility];  Service: Vascular;  Laterality: Left;   REVISON OF ARTERIOVENOUS FISTULA Left 11/06/2020   Procedure: EXCISION AND CLEANOUT OF LEFT ARM;  Surgeon: Rosetta Posner, MD;  Location: [facility];  Service: Vascular;  Laterality: Left;   SPHINCTEROTOMY  05/17/2020   Procedure: Joan Mayans;  Surgeon: Mansouraty, Telford Nab., MD;  Location: [facility];  Service: Gastroenterology;;   SUBXYPHOID PERICARDIAL WINDOW N/A 01/10/2018   Procedure: SUBXYPHOID PERICARDIAL WINDOW;  Surgeon: Rexene Alberts, MD;  Location: [facility];  Service: Thoracic;  Laterality: N/A;   TRANSMETATARSAL AMPUTATION Right 03/13/2016   Procedure: TRANSMETATARSAL AMPUTATION;  Surgeon: Edrick Kins, DPM;  Location: [facility];  Service: Podiatry;  Laterality: Right;   VENA CAVA FILTER PLACEMENT  09/2012   due to preparation for surgery    Family History  Problem Relation Age of Onset  Anesthesia problems Son    Hypertension Son    Diabetes Father    Hypertension Father    Other Father        amputation   Colon cancer Neg Hx     Social History:  reports that he has never smoked. He has never used smokeless tobacco. He reports that he does not drink alcohol and does not use drugs.  Allergies: No Known Allergies  Medications: I have reviewed the patient's current medications.  Results for orders placed or performed during the hospital encounter of 06/27/21 (from the past 48 hour(s))  Resp Panel by RT-PCR (Flu A&B, Covid) Nasopharyngeal Swab     Status: None   Collection Time: 06/27/21  8:42 PM   Specimen: Nasopharyngeal Swab; Nasopharyngeal(NP) swabs in vial transport medium  Result Value Ref Range   SARS Coronavirus 2 by RT PCR NEGATIVE NEGATIVE    Comment: (NOTE) SARS-CoV-2 target nucleic acids are NOT DETECTED.  The SARS-CoV-2 RNA is generally detectable in upper respiratory specimens during the acute  phase of infection. The lowest concentration of SARS-CoV-2 viral copies this assay can detect is 138 copies/mL. A negative result does not preclude SARS-Cov-2 infection and should not be used as the sole basis for treatment or other patient management decisions. A negative result may occur with  improper specimen collection/handling, submission of specimen other than nasopharyngeal swab, presence of viral mutation(s) within the areas targeted by this assay, and inadequate number of viral copies(<138 copies/mL). A negative result must be combined with clinical observations, patient history, and epidemiological information. The expected result is Negative.  Fact Sheet for Patients:  EntrepreneurPulse.com.au  Fact Sheet for Healthcare Providers:  IncredibleEmployment.be  This test is no t yet approved or cleared by the [place] FDA and  has been authorized for detection and/or diagnosis of SARS-CoV-2 by FDA under an Emergency Use Authorization (EUA). This EUA will remain  in effect (meaning this test can be used) for the duration of the COVID-19 declaration under Section 564(b)(1) of the Act, 21 U.S.C.section 360bbb-3(b)(1), unless the authorization is terminated  or revoked sooner.       Influenza A by PCR NEGATIVE NEGATIVE   Influenza B by PCR NEGATIVE NEGATIVE    Comment: (NOTE) The Xpert Xpress SARS-CoV-2/FLU/RSV plus assay is intended as an aid in the diagnosis of influenza from Nasopharyngeal swab specimens and should not be used as a sole basis for treatment. Nasal washings and aspirates are unacceptable for Xpert Xpress SARS-CoV-2/FLU/RSV testing.  Fact Sheet for Patients: EntrepreneurPulse.com.au  Fact Sheet for Healthcare Providers: IncredibleEmployment.be  This test is not yet approved or cleared by the [place] FDA and has been authorized for detection and/or diagnosis of SARS-CoV-2  by FDA under an Emergency Use Authorization (EUA). This EUA will remain in effect (meaning this test can be used) for the duration of the COVID-19 declaration under Section 564(b)(1) of the Act, 21 U.S.C. section 360bbb-3(b)(1), unless the authorization is terminated or revoked.  Performed at [facility], [address] [place], [place] [address]   CBG monitoring, ED     Status: Abnormal   Collection Time: 06/27/21  9:17 PM  Result Value Ref Range   Glucose-Capillary 231 (H) 70 - 99 mg/dL    Comment: Glucose reference range applies only to samples taken after fasting for at least 8 hours.  CBC     Status: Abnormal   Collection Time: 06/27/21 10:00 PM  Result Value Ref Range   WBC 6.8 4.0 - 10.5  K/uL   RBC 4.24 4.22 - 5.81 MIL/uL   Hemoglobin 13.8 13.0 - 17.0 g/dL   HCT 43.9 39.0 - 52.0 %   MCV 103.5 (H) 80.0 - 100.0 fL   MCH 32.5 26.0 - 34.0 pg   MCHC 31.4 30.0 - 36.0 g/dL   RDW 16.3 (H) 11.5 - 15.5 %   Platelets 122 (L) 150 - 400 K/uL   nRBC 0.0 0.0 - 0.2 %    Comment: Performed at [facility], [address] [place], [place] [address]  Troponin I (High Sensitivity)     Status: Abnormal   Collection Time: 06/27/21 10:00 PM  Result Value Ref Range   Troponin I (High Sensitivity) 42 (H) <18 ng/L    Comment: (NOTE) Elevated high sensitivity troponin I (hsTnI) values and significant  changes across serial measurements may suggest ACS but many other  chronic and acute conditions are known to elevate hsTnI results.  Refer to the Links section for chest pain algorithms and additional  guidance. Performed at [facility], [address], [place], [place] [address]   Comprehensive metabolic panel     Status: Abnormal   Collection Time: 06/27/21 10:00 PM  Result Value Ref Range   Sodium 138 135 - 145 mmol/L   Potassium 3.9 3.5 - 5.1 mmol/L   Chloride 97 (L) 98 - 111 mmol/L   CO2 22 22 - 32 mmol/L   Glucose, Bld 234 (H) 70 - 99 mg/dL    Comment: Glucose reference range applies  only to samples taken after fasting for at least 8 hours.   BUN 46 (H) 8 - 23 mg/dL   Creatinine, Ser 7.28 (H) 0.61 - 1.24 mg/dL   Calcium 8.3 (L) 8.9 - 10.3 mg/dL   Total Protein 5.5 (L) 6.5 - 8.1 g/dL   Albumin 2.8 (L) 3.5 - 5.0 g/dL   AST 43 (H) 15 - 41 U/L   ALT 41 0 - 44 U/L   Alkaline Phosphatase 115 38 - 126 U/L   Total Bilirubin 0.7 0.3 - 1.2 mg/dL   GFR, Estimated 8 (L) >60 mL/min    Comment: (NOTE) Calculated using the CKD-EPI Creatinine Equation (2021)    Anion gap 19 (H) 5 - 15    Comment: Performed at [facility], [address] [place], [place] [address]  Brain natriuretic peptide     Status: Abnormal   Collection Time: 06/27/21 10:00 PM  Result Value Ref Range   B Natriuretic Peptide 411.0 (H) 0.0 - 100.0 pg/mL    Comment: Performed at [facility], [address], [place], [place] [address]  Lipase, blood     Status: Abnormal   Collection Time: 06/27/21 10:00 PM  Result Value Ref Range   Lipase 79 (H) 11 - 51 U/L    Comment: Performed at [facility], [address], [place], [place] [address]  Protime-INR     Status: Abnormal   Collection Time: 06/27/21 10:00 PM  Result Value Ref Range   Prothrombin Time 36.9 (H) 11.4 - 15.2 seconds   INR 3.7 (H) 0.8 - 1.2    Comment: (NOTE) INR goal varies based on device and disease states. Performed at [facility], [address], [place],  [address]   Troponin I (High Sensitivity)     Status: Abnormal   Collection Time: 06/27/21 11:56 PM  Result Value Ref Range   Troponin I (High Sensitivity) 87 (H) <18 ng/L    Comment: DELTA CHECK NOTED RESULT CALLED TO, READ BACK  BY AND VERIFIED WITH: TURNER,C AT 0047 ON 1.28.23 BY RUCINSKI,B (NOTE) Elevated high sensitivity troponin I (hsTnI) values and significant  changes across serial measurements may suggest ACS but many other  chronic and acute conditions are known to elevate hsTnI results.  Refer to the Links section for chest pain algorithms and additional   guidance. Performed at [facility], [address], [place], [place] [address]   Procalcitonin - Baseline     Status: None   Collection Time: 06/27/21 11:56 PM  Result Value Ref Range   Procalcitonin 5.53 ng/mL    Comment:        Interpretation: PCT > 2 ng/mL: Systemic infection (sepsis) is likely, unless other causes are known. (NOTE)       Sepsis PCT Algorithm           Lower Respiratory Tract                                      Infection PCT Algorithm    ----------------------------     ----------------------------         PCT < 0.25 ng/mL                PCT < 0.10 ng/mL          Strongly encourage             Strongly discourage   discontinuation of antibiotics    initiation of antibiotics    ----------------------------     -----------------------------       PCT 0.25 - 0.50 ng/mL            PCT 0.10 - 0.25 ng/mL               OR       >80% decrease in PCT            Discourage initiation of                                            antibiotics      Encourage discontinuation           of antibiotics    ----------------------------     -----------------------------         PCT >= 0.50 ng/mL              PCT 0.26 - 0.50 ng/mL               AND       <80% decrease in PCT              Encourage initiation of                                             antibiotics       Encourage continuation           of antibiotics    ----------------------------     -----------------------------        PCT >= 0.50 ng/mL                  PCT > 0.50 ng/mL  AND         increase in PCT                  Strongly encourage                                      initiation of antibiotics    Strongly encourage escalation           of antibiotics                                     -----------------------------                                           PCT <= 0.25 ng/mL                                                 OR                                        > 80% decrease in  PCT                                      Discontinue / Do not initiate                                             antibiotics  Performed at [facility] [address] [place], [place] [address]   Lactic acid, plasma     Status: Abnormal   Collection Time: 06/28/21  1:04 AM  Result Value Ref Range   Lactic Acid, Venous 4.8 (HH) 0.5 - 1.9 mmol/L    Comment: CRITICAL RESULT CALLED TO, READ BACK BY AND VERIFIED WITH: COE,A @ 0144 ON 06/28/21 BY JUW Performed at [facility], [address], [place], [place] [address]   Culture, blood (Routine X 2) w Reflex to ID Panel     Status: None (Preliminary result)   Collection Time: 06/28/21  1:04 AM   Specimen: BLOOD LEFT HAND  Result Value Ref Range   Specimen Description BLOOD LEFT HAND    Special Requests      BOTTLES DRAWN AEROBIC ONLY Blood Culture adequate volume   Culture      NO GROWTH 1 DAY Performed at [facility], [address], [place],  [address]    Report Status PENDING   Culture, blood (Routine X 2) w Reflex to ID Panel     Status: None (Preliminary result)   Collection Time: 06/28/21  3:15 AM   Specimen: BLOOD LEFT ARM  Result Value Ref Range   Specimen Description BLOOD LEFT ARM    Special Requests      BOTTLES DRAWN AEROBIC AND ANAEROBIC Blood Culture adequate volume   Culture      NO GROWTH  1 DAY Performed at [facility], [address] [place], [place] [address]    Report Status PENDING   Lactic acid, plasma     Status: Abnormal   Collection Time: 06/28/21  4:30 AM  Result Value Ref Range   Lactic Acid, Venous >9.0 (HH) 0.5 - 1.9 mmol/L    Comment: CRITICAL RESULT CALLED TO, READ BACK BY AND VERIFIED WITH: TURNER @ 0624 ON [id number] BY HENDERSON L Performed at [facility], [address] [place], [place] [address]   Lactic acid, plasma     Status: Abnormal   Collection Time: 06/28/21  7:34 AM  Result Value Ref Range   Lactic Acid, Venous 3.1 (HH) 0.5 - 1.9 mmol/L    Comment: CRITICAL RESULT CALLED  TO, READ BACK BY AND VERIFIED WITH: WUGQBV@ 6945 ON [id number] BY HENDERSON L Performed at [facility], [address] [place], [place] [address]   Troponin I (High Sensitivity)     Status: Abnormal   Collection Time: 06/28/21  7:34 AM  Result Value Ref Range   Troponin I (High Sensitivity) 687 (HH) <18 ng/L    Comment: CRITICAL RESULT CALLED TO, READ BACK BY AND VERIFIED WITH: HYLTON @ 0834 ON [id number] BY HENDERSON L (NOTE) Elevated high sensitivity troponin I (hsTnI) values and significant  changes across serial measurements may suggest ACS but many other  chronic and acute conditions are known to elevate hsTnI results.  Refer to the Links section for chest pain algorithms and additional  guidance. Performed at [facility], [address] [place], [place] [address]   Hemoglobin A1c     Status: Abnormal   Collection Time: 06/28/21  7:34 AM  Result Value Ref Range   Hgb A1c MFr Bld 8.1 (H) 4.8 - 5.6 %    Comment: (NOTE) Pre diabetes:          5.7%-6.4%  Diabetes:              >6.4%  Glycemic control for   <7.0% adults with diabetes    Mean Plasma Glucose 185.77 mg/dL    Comment: Performed at [facility] [address] [place], [place] [address]  Folate     Status: None   Collection Time: 06/28/21  7:34 AM  Result Value Ref Range   Folate 59.4 >5.9 ng/mL    Comment: RESULTS CONFIRMED BY MANUAL DILUTION Performed at [facility], [address] [place], [place] [address]   Vitamin B12     Status: Abnormal   Collection Time: 06/28/21  7:34 AM  Result Value Ref Range   Vitamin B-12 1,509 (H) 180 - 914 pg/mL    Comment: (NOTE) This assay is not validated for testing neonatal or myeloproliferative syndrome specimens for Vitamin B12 levels. Performed at [facility], [address], [place], [place] [address]   CBC     Status: Abnormal   Collection Time: 06/28/21  7:34 AM  Result Value Ref Range   WBC 4.9 4.0 - 10.5 K/uL   RBC 4.15 (L) 4.22 - 5.81 MIL/uL   Hemoglobin  14.0 13.0 - 17.0 g/dL   HCT 43.2 39.0 - 52.0 %   MCV 104.1 (H) 80.0 - 100.0 fL   MCH 33.7 26.0 - 34.0 pg   MCHC 32.4 30.0 - 36.0 g/dL   RDW 16.4 (H) 11.5 - 15.5 %   Platelets 126 (L) 150 - 400 K/uL   nRBC 0.0 0.0 - 0.2 %    Comment: Performed at [facility],  [address] [place], [place] [address]  Comprehensive metabolic panel     Status: Abnormal   Collection Time: 06/28/21  7:34 AM  Result Value Ref Range   Sodium 134 (L) 135 - 145 mmol/L   Potassium 4.1 3.5 - 5.1 mmol/L   Chloride 95 (L) 98 - 111 mmol/L   CO2 25 22 - 32 mmol/L   Glucose, Bld 164 (H) 70 - 99 mg/dL    Comment: Glucose reference range applies only to samples taken after fasting for at least 8 hours.   BUN 50 (H) 8 - 23 mg/dL   Creatinine, Ser 7.60 (H) 0.61 - 1.24 mg/dL   Calcium 8.3 (L) 8.9 - 10.3 mg/dL   Total Protein 6.8 6.5 - 8.1 g/dL   Albumin 3.2 (L) 3.5 - 5.0 g/dL   AST 43 (H) 15 - 41 U/L   ALT 44 0 - 44 U/L   Alkaline Phosphatase 124 38 - 126 U/L   Total Bilirubin 0.6 0.3 - 1.2 mg/dL   GFR, Estimated 7 (L) >60 mL/min    Comment: (NOTE) Calculated using the CKD-EPI Creatinine Equation (2021)    Anion gap 14 5 - 15    Comment: Performed at [facility], [address], [place], [place] [address]  Protime-INR     Status: Abnormal   Collection Time: 06/28/21  7:34 AM  Result Value Ref Range   Prothrombin Time 34.8 (H) 11.4 - 15.2 seconds   INR 3.5 (H) 0.8 - 1.2    Comment: (NOTE) INR goal varies based on device and disease states. Performed at [facility], [address], [place], [place] [address]   HIV Antibody (routine testing w rflx)     Status: None   Collection Time: 06/28/21  7:36 AM  Result Value Ref Range   HIV Screen 4th Generation wRfx Non Reactive Non Reactive    Comment: Performed at [facility], [place] [address], [place], [place] [address]  Magnesium     Status: None   Collection Time: 06/28/21  7:36 AM  Result Value Ref Range   Magnesium 2.3 1.7 - 2.4 mg/dL    Comment:  Performed at [facility], [address], [place], [place] [address]  Phosphorus     Status: None   Collection Time: 06/28/21  7:36 AM  Result Value Ref Range   Phosphorus 2.7 2.5 - 4.6 mg/dL    Comment: Performed at [facility], [address], [place], [place] [address]  Blood gas, venous     Status: Abnormal   Collection Time: 06/28/21  7:56 AM  Result Value Ref Range   FIO2 28.00    pH, Ven 7.323 7.250 - 7.430   pCO2, Ven 55.4 44.0 - 60.0 mmHg   pO2, Ven <31.0 (LL) 32.0 - 45.0 mmHg    Comment: CRITICAL RESULT CALLED TO, READ BACK BY AND VERIFIED WITH: OAKLEY B @ 0811 ON [id number] BY HENDERSON L    Bicarbonate 24.0 20.0 - 28.0 mmol/L   Acid-Base Excess 2.5 (H) 0.0 - 2.0 mmol/L   O2 Saturation 40.9 %   Patient temperature 36.5    Collection site BLOOD LEFT HAND    Drawn by 3790    Sample type VENOUS     Comment: Performed at [facility], [address] [place], [place] [address]  MRSA Next Gen by PCR, Nasal     Status: None   Collection Time: 06/28/21  8:33 AM   Specimen: Nasal Mucosa; Nasal Swab  Result Value  Ref Range   MRSA by PCR Next Gen NOT DETECTED NOT DETECTED    Comment: (NOTE) The GeneXpert MRSA Assay (FDA approved for NASAL specimens only), is one component of a comprehensive MRSA colonization surveillance program. It is not intended to diagnose MRSA infection nor to guide or monitor treatment for MRSA infections. Test performance is not FDA approved in patients less than 75 years old. Performed at [facility], [address] [place], [place] [address]   Glucose, capillary     Status: Abnormal   Collection Time: 06/28/21  8:35 AM  Result Value Ref Range   Glucose-Capillary 141 (H) 70 - 99 mg/dL    Comment: Glucose reference range applies only to samples taken after fasting for at least 8 hours.  Glucose, capillary     Status: Abnormal   Collection Time: 06/28/21 11:40 AM  Result Value Ref Range   Glucose-Capillary 144 (H) 70 - 99 mg/dL    Comment: Glucose  reference range applies only to samples taken after fasting for at least 8 hours.  Glucose, capillary     Status: Abnormal   Collection Time: 06/28/21  4:31 PM  Result Value Ref Range   Glucose-Capillary 149 (H) 70 - 99 mg/dL    Comment: Glucose reference range applies only to samples taken after fasting for at least 8 hours.  Glucose, capillary     Status: Abnormal   Collection Time: 06/28/21  9:45 PM  Result Value Ref Range   Glucose-Capillary 137 (H) 70 - 99 mg/dL    Comment: Glucose reference range applies only to samples taken after fasting for at least 8 hours.   Comment 1 Notify RN    Comment 2 Document in Chart   Protime-INR     Status: Abnormal   Collection Time: 06/29/21  4:57 AM  Result Value Ref Range   Prothrombin Time 36.7 (H) 11.4 - 15.2 seconds   INR 3.7 (H) 0.8 - 1.2    Comment: (NOTE) INR goal varies based on device and disease states. Performed at [facility], [address] [place], [place] [address]   Renal function panel     Status: Abnormal   Collection Time: 06/29/21  4:58 AM  Result Value Ref Range   Sodium 135 135 - 145 mmol/L   Potassium 4.3 3.5 - 5.1 mmol/L   Chloride 95 (L) 98 - 111 mmol/L   CO2 25 22 - 32 mmol/L   Glucose, Bld 92 70 - 99 mg/dL    Comment: Glucose reference range applies only to samples taken after fasting for at least 8 hours.   BUN 60 (H) 8 - 23 mg/dL   Creatinine, Ser 9.40 (H) 0.61 - 1.24 mg/dL   Calcium 8.4 (L) 8.9 - 10.3 mg/dL   Phosphorus 3.9 2.5 - 4.6 mg/dL   Albumin 2.7 (L) 3.5 - 5.0 g/dL   GFR, Estimated 6 (L) >60 mL/min    Comment: (NOTE) Calculated using the CKD-EPI Creatinine Equation (2021)    Anion gap 15 5 - 15    Comment: Performed at [facility], [address], [place] [employer] [place], [place] [address]  Glucose, capillary     Status: None   Collection Time: 06/29/21  7:45 AM  Result Value Ref Range   Glucose-Capillary 82 70 - 99 mg/dL    Comment: Glucose reference range applies only to samples taken after fasting for at  least 8 hours.  Glucose, capillary     Status: None   Collection Time: 06/29/21 11:30 AM  Result Value Ref Range   Glucose-Capillary 95 70 - 99 mg/dL    Comment: Glucose reference range applies only to samples taken after fasting for at least 8 hours.    DG Chest 1 View  Result Date: 06/27/2021 CLINICAL DATA:  Shortness of breath, vomiting EXAM: CHEST  1 VIEW COMPARISON:  07/16/2018 FINDINGS: There is poor inspiration. There are patchy linear densities in both lower lung fields, more so on the left side. Costophrenic angles are clear. There is no pneumothorax. There is mild extrinsic pressure over the left lateral margin of trachea in the lower neck suggesting possible enlarged thyroid. IMPRESSION: There linear patchy densities in both lower lung fields, more so on the left side. Findings suggest possible atelectasis/pneumonia. There is poor inspiration which could account for some of the crowding of markings in the lower lung fields. There is no significant pleural effusion or pneumothorax. Electronically Signed   By: Elmer Picker [name]   On: 06/27/2021 20:38   CT CHEST ABDOMEN PELVIS W CONTRAST  Result Date: 06/28/2021 CLINICAL DATA:  Abdominal pain and dyspnea EXAM: CT CHEST, ABDOMEN, AND PELVIS WITH CONTRAST TECHNIQUE: Multidetector CT imaging of the chest, abdomen and pelvis was performed following the standard protocol during bolus administration of intravenous contrast. RADIATION DOSE REDUCTION: This exam was performed according to the departmental dose-optimization program which includes automated exposure control, adjustment of the mA and/or kV according to patient size and/or use of iterative reconstruction technique. CONTRAST:  140mL OMNIPAQUE IOHEXOL 350 MG/ML SOLN COMPARISON:  None. FINDINGS: CT CHEST FINDINGS Cardiovascular: Calcific aortic atherosclerosis and coronary artery calcification. No pericardial effusion. Mediastinum/Nodes: No enlarged mediastinal, hilar, or axillary lymph  nodes. Thyroid gland, trachea, and esophagus demonstrate no significant findings. Lungs/Pleura: Bibasilar atelectasis. No pleural effusion or pneumothorax. Musculoskeletal: No chest wall mass or suspicious bone lesions identified. CT ABDOMEN PELVIS FINDINGS Hepatobiliary: No focal liver abnormality is seen. Status post cholecystectomy. No biliary dilatation. Pancreas: Unremarkable. No pancreatic ductal dilatation or surrounding inflammatory changes. Spleen: Normal in size without focal abnormality. Adrenals/Urinary Tract: Adrenal glands are unremarkable. Kidneys are normal, without renal calculi, focal lesion, or hydronephrosis. Bladder is unremarkable. Stomach/Bowel: Stomach is within normal limits. Appendix appears normal. No evidence of bowel wall thickening, distention, or inflammatory changes. Vascular/Lymphatic: Infrarenal IVC filter. Calcific aortic atherosclerosis. No lymphadenopathy. Reproductive: Prostate is unremarkable. Other: Small fat containing ventral abdominal hernia. Musculoskeletal: No acute or significant osseous findings. IMPRESSION: 1. No acute abnormality of the chest, abdomen or pelvis. Aortic Atherosclerosis (ICD10-I70.0). Electronically Signed   By: Ulyses Jarred [name]   On: 06/28/2021 01:54   ECHOCARDIOGRAM COMPLETE  Result Date: 06/28/2021    ECHOCARDIOGRAM REPORT   Patient Name:   [name] Date of Exam: 06/28/2021 Medical Rec #:  [id number]     Height:       75.0 in Accession #:    [id number]    Weight:       251.5 lb Date of Birth:  [birth year]    BSA:          2.419 m Patient Age:    58 years      BP:           136/45 mmHg Patient Gender: M             HR:           67 bpm. Exam Location:  [facility] Procedure: 2D Echo, Cardiac Doppler and Color Doppler Indications:    CHF-Acute Diastolic G40.10  History:  Patient has prior history of Echocardiogram examinations, most                 recent 04/07/2019. COPD, Arrythmias:Atrial Fibrillation and                 Atrial Flutter;  Risk Factors:Hypertension, Diabetes,                 Dyslipidemia and Sleep Apnea.  Sonographer:    Bernadene Person RDCS Referring Phys: [id number] OLADAPO ADEFESO IMPRESSIONS  1. Left ventricular ejection fraction, by estimation, is 60 to 65%. The left ventricle has normal function. The left ventricle has no regional wall motion abnormalities. There is moderate asymmetric left ventricular hypertrophy of the septal segment. Left ventricular diastolic parameters are indeterminate.  2. Ventricular septum is flattened in systole suggesting RV pressure overload. . Right ventricular systolic function was not well visualized. The right ventricular size is not well visualized. There is moderately elevated pulmonary artery systolic pressure.  3. The mitral valve is normal in structure. No evidence of mitral valve regurgitation. No evidence of mitral stenosis.  4. The aortic valve is tricuspid. There is moderate calcification of the aortic valve. There is moderate thickening of the aortic valve. Aortic valve regurgitation is not visualized. No aortic stenosis is present.  5. Aortic dilatation noted. There is mild dilatation of the ascending aorta, measuring 40 mm.  6. The inferior vena cava is dilated in size with >50% respiratory variability, suggesting right atrial pressure of 8 mmHg. FINDINGS  Left Ventricle: Left ventricular ejection fraction, by estimation, is 60 to 65%. The left ventricle has normal function. The left ventricle has no regional wall motion abnormalities. The left ventricular internal cavity size was normal in size. There is  moderate asymmetric left ventricular hypertrophy of the septal segment. Left ventricular diastolic parameters are indeterminate. Right Ventricle: Ventricular septum is flattened in systole suggesting RV pressure overload. The right ventricular size is not well visualized. Right vetricular wall thickness was not well visualized. Right ventricular systolic function was not well  visualized. There is moderately elevated pulmonary artery systolic pressure. The tricuspid regurgitant velocity is 3.20 m/s, and with an assumed right atrial pressure of 8 mmHg, the estimated right ventricular systolic pressure is 90.2 mmHg. Left Atrium: Left atrial size was normal in size. Right Atrium: Right atrial size was normal in size. Pericardium: There is no evidence of pericardial effusion. Mitral Valve: The mitral valve is normal in structure. There is mild thickening of the mitral valve leaflet(s). There is mild calcification of the mitral valve leaflet(s). Mild mitral annular calcification. No evidence of mitral valve regurgitation. No evidence of mitral valve stenosis. Tricuspid Valve: The tricuspid valve is not well visualized. Tricuspid valve regurgitation is mild . No evidence of tricuspid stenosis. Aortic Valve: The aortic valve is tricuspid. There is moderate calcification of the aortic valve. There is moderate thickening of the aortic valve. There is mild aortic valve annular calcification. Aortic valve regurgitation is not visualized. No aortic stenosis is present. Aortic valve mean gradient measures 4.4 mmHg. Aortic valve peak gradient measures 9.0 mmHg. Aortic valve area, by VTI measures 2.80 cm. Pulmonic Valve: The pulmonic valve was not well visualized. Pulmonic valve regurgitation is not visualized. No evidence of pulmonic stenosis. Aorta: Aortic dilatation noted and the aortic root is normal in size and structure. There is mild dilatation of the ascending aorta, measuring 40 mm. Venous: The inferior vena cava is dilated in size with greater than 50% respiratory variability, suggesting right  atrial pressure of 8 mmHg. IAS/Shunts: No atrial level shunt detected by color flow Doppler.  LEFT VENTRICLE PLAX 2D LVIDd:         4.40 cm LVIDs:         2.90 cm LV PW:         1.10 cm LV IVS:        1.40 cm LVOT diam:     2.30 cm LV SV:         76 LV SV Index:   31 LVOT Area:     4.15 cm  LV Volumes  (MOD) LV vol d, MOD A2C: 96.2 ml LV vol d, MOD A4C: 96.1 ml LV vol s, MOD A2C: 39.7 ml LV vol s, MOD A4C: 42.6 ml LV SV MOD A2C:     56.5 ml LV SV MOD A4C:     96.1 ml LV SV MOD BP:      53.0 ml RIGHT VENTRICLE RV S prime:     6.89 cm/s TAPSE (M-mode): 2.2 cm LEFT ATRIUM             Index        RIGHT ATRIUM           Index LA diam:        4.00 cm 1.65 cm/m   RA Area:     18.90 cm LA Vol (A2C):   63.1 ml 26.09 ml/m  RA Volume:   49.70 ml  20.55 ml/m LA Vol (A4C):   58.1 ml 24.02 ml/m LA Biplane Vol: 64.9 ml 26.83 ml/m  AORTIC VALVE AV Area (Vmax):    2.69 cm AV Area (Vmean):   2.62 cm AV Area (VTI):     2.80 cm AV Vmax:           149.62 cm/s AV Vmean:          98.265 cm/s AV VTI:            0.270 m AV Peak Grad:      9.0 mmHg AV Mean Grad:      4.4 mmHg LVOT Vmax:         96.83 cm/s LVOT Vmean:        61.867 cm/s LVOT VTI:          0.182 m LVOT/AV VTI ratio: 0.67  AORTA Ao Root diam: 3.90 cm Ao Asc diam:  4.00 cm TRICUSPID VALVE TR Peak grad:   41.0 mmHg TR Vmax:        320.00 cm/s  SHUNTS Systemic VTI:  0.18 m Systemic Diam: 2.30 cm Carlyle Dolly MD Electronically signed by Carlyle Dolly MD Signature Date/Time: 06/28/2021/3:34:40 PM    Final     ROS: previously abd pain and diarrhea as well as feeling generally unwell-  much better today  Blood pressure 113/71, pulse 84, temperature (!) 97.3 F (36.3 C), temperature source Oral, resp. rate (!) 22, height 6\' 3"  (1.905 m), weight 115.6 kg, SpO2 97 %. General appearance: alert and no distress Resp: clear to auscultation bilaterally Cardio: regular rate and rhythm, S1, S2 normal, no murmur, click, rub or gallop GI: soft, non-tender; bowel sounds normal; no masses,  no organomegaly Extremities: extremities normal, atraumatic, no cyanosis or edema Right upper arm AVF  Assessment/Plan: 71 year old BM with heart failure and ESRD- admitted with sepsis type picture 1 sepsis type picture-  unclear source-  maybe PNA-  better on cefepime 2 ESRD:  normally TTS at [facility]-  will plan on  HD today after being put off yesterday  3 Hypertension/vol: has cardiomyopathy-  seems that his BP runs low-  in review of his HD treatments it is mostly the standing that are low. Unclear if is on midodrine as OP-  it has been started here.  I wonder if his EDW isnt too low for him ?  Right now supposedly 5 kg over EDW and does not appear to have edema and is eating a hearty lunch-  gentle UF with HD today  4. 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 Metabolic Bone Disease: continuing renvela, sensipar and renavite    Louis Meckel 06/29/2021, 12:26 PM

## 2021-06-30 LAB — CBC
HCT: 42.6 % (ref 39.0–52.0)
Hemoglobin: 13.2 g/dL (ref 13.0–17.0)
MCH: 32.3 pg (ref 26.0–34.0)
MCHC: 31 g/dL (ref 30.0–36.0)
MCV: 104.2 fL — ABNORMAL HIGH (ref 80.0–100.0)
Platelets: 131 10*3/uL — ABNORMAL LOW (ref 150–400)
RBC: 4.09 MIL/uL — ABNORMAL LOW (ref 4.22–5.81)
RDW: 16.3 % — ABNORMAL HIGH (ref 11.5–15.5)
WBC: 4.4 10*3/uL (ref 4.0–10.5)
nRBC: 0 % (ref 0.0–0.2)

## 2021-06-30 LAB — GASTROINTESTINAL PANEL BY PCR, STOOL (REPLACES STOOL CULTURE)
Adenovirus F40/41: NOT DETECTED
Astrovirus: NOT DETECTED
Campylobacter species: NOT DETECTED
Cryptosporidium: NOT DETECTED
Cyclospora cayetanensis: NOT DETECTED
Entamoeba histolytica: NOT DETECTED
Enteroaggregative E coli (EAEC): NOT DETECTED
Enteropathogenic E coli (EPEC): NOT DETECTED
Enterotoxigenic E coli (ETEC): NOT DETECTED
Giardia lamblia: NOT DETECTED
Norovirus GI/GII: DETECTED — AB
Plesimonas shigelloides: NOT DETECTED
Rotavirus A: NOT DETECTED
Salmonella species: NOT DETECTED
Sapovirus (I, II, IV, and V): NOT DETECTED
Shiga like toxin producing E coli (STEC): NOT DETECTED
Shigella/Enteroinvasive E coli (EIEC): NOT DETECTED
Vibrio cholerae: NOT DETECTED
Vibrio species: NOT DETECTED
Yersinia enterocolitica: DETECTED — AB

## 2021-06-30 LAB — COMPREHENSIVE METABOLIC PANEL
ALT: 34 U/L (ref 0–44)
AST: 40 U/L (ref 15–41)
Albumin: 3.2 g/dL — ABNORMAL LOW (ref 3.5–5.0)
Alkaline Phosphatase: 123 U/L (ref 38–126)
Anion gap: 13 (ref 5–15)
BUN: 34 mg/dL — ABNORMAL HIGH (ref 8–23)
CO2: 26 mmol/L (ref 22–32)
Calcium: 8.5 mg/dL — ABNORMAL LOW (ref 8.9–10.3)
Chloride: 95 mmol/L — ABNORMAL LOW (ref 98–111)
Creatinine, Ser: 6.94 mg/dL — ABNORMAL HIGH (ref 0.61–1.24)
GFR, Estimated: 8 mL/min — ABNORMAL LOW (ref >60)
Glucose, Bld: 107 mg/dL — ABNORMAL HIGH (ref 70–99)
Potassium: 3.9 mmol/L (ref 3.5–5.1)
Sodium: 134 mmol/L — ABNORMAL LOW (ref 135–145)
Total Bilirubin: 0.6 mg/dL (ref 0.3–1.2)
Total Protein: 7.1 g/dL (ref 6.5–8.1)

## 2021-06-30 LAB — PROTIME-INR
INR: 2.4 — ABNORMAL HIGH (ref 0.8–1.2)
Prothrombin Time: 25.9 seconds — ABNORMAL HIGH (ref 11.4–15.2)

## 2021-06-30 LAB — CORTISOL-AM, BLOOD: Cortisol - AM: 13.5 ug/dL (ref 6.7–22.6)

## 2021-06-30 LAB — PROCALCITONIN: Procalcitonin: 8.53 ng/mL

## 2021-06-30 LAB — GLUCOSE, CAPILLARY
Glucose-Capillary: 107 mg/dL — ABNORMAL HIGH (ref 70–99)
Glucose-Capillary: 78 mg/dL (ref 70–99)
Glucose-Capillary: 96 mg/dL (ref 70–99)
Glucose-Capillary: 114 mg/dL — ABNORMAL HIGH (ref 70–99)

## 2021-06-30 LAB — TROPONIN I (HIGH SENSITIVITY): Troponin I (High Sensitivity): 483 ng/L (ref <18)

## 2021-06-30 MED ORDER — WARFARIN SODIUM 5 MG PO TABS
5.0000 mg | ORAL_TABLET | Freq: Once | ORAL | Status: AC
  Administered 2021-06-30: 5 mg via ORAL
  Filled 2021-06-30: qty 1

## 2021-06-30 MED ORDER — WARFARIN - PHARMACIST DOSING INPATIENT: Freq: Every day | Status: DC

## 2021-06-30 NOTE — Progress Notes (Signed)
Date and time results received: 06/30/21 1505 (use smartphrase ".now" to insert current time)  Test: GI PANEL Critical Value: Positive for Yersinia Enterocolitica and    Positive for Norovirus  Name of Provider Notified: Dr Tat  Orders Received? Or Actions Taken?:  No new orders currently

## 2021-06-30 NOTE — Progress Notes (Signed)
PROGRESS NOTE  [name] ZYS:[id number] DOB: 04/22/1951 DOA: 06/27/2021 PCP: Burnard Bunting, MD  Brief History:  71 year old male with a history of ESRD (TTS), chronic atrial fibrillation/atrial flutter, DVT right leg, diabetes mellitus type 2, hypertension, hyperlipidemia, NHL in remission, COPD, systolic CHF presenting with 2-day history of shortness of breath, generalized abdominal pain, and diarrhea that began on the evening of 06/26/2020 after dialysis.  The patient states that he had been in usual state of health until after his dialysis on 06/26/2020.  He states that he has been compliant with dialysis testing the whole time.  He states that his only new medication is midodrine which he started 2 weeks ago.  He states that he only takes it on dialysis days.  He denies any recent antibiotics.  He denies any fevers, chills, headache, neck pain, coughing, hemoptysis, chest pain.  He denies any worsening peripheral edema.  He states that his abdominal pain was associated with liquid stools of which he had 5-6 on 06/27/2021.  There is no hematochezia, melena.  He states that his pain was constant with intermittent cramping.  He denies any recent antibiotics.  There is been no sick contacts or unusual or undercooked foods.  He had some nausea without any emesis.  He endorses compliance with all his medications. In the ED, the patient was afebrile and hypotensive with systolic blood pressure in the 70s.  He was given 3 L of fluid but remained hypotensive.  He was started on Levophed.  Oxygen saturation was 92% room air.  He was placed on 2 L supplemental with saturation up to 99%.  He was initially tachycardic 110-120s.  Patient was started on vancomycin and cefepime.  However the second blood culture was obtained after the patient has been started on antibiotics.  Chest x-ray showed L>R bibasilar opacities.  CT chest, abdomen, pelvis was negative for any acute findings.  Notably, there was no  splenomegaly or pancreatic or bowel inflammatory changes or bili ductal dilatation.  There was an IVC filter.  The patient was admitted for further treatment and evaluation of his sepsis.  COVID-19 PCR and influenza PCR negative   Assessment/Plan: Septic shock -Patient presented with tachycardia, hypotension refractory to fluid resuscitation -Lactic acid 4.8>>greater than 9 -Continue Levophed>> wean for SBP greater than 90 -Levophed 6 mcg>>2 mcg -At baseline, the patient takes midodrine for soft blood pressures -Continue midodrine -Follow blood cultures--neg -PCT 5.53 -am cortisol-pending -BP cuff accuracy questionable -wean off levophed   Lobar pneumonia -Continue empiric cefepime -continue empiric doxycycline -1/27 CXR personally reviewed--patchy densities bilateral LL  -MRSA screen--neg -d/c vanc   Lactic acidosis -Check VBG--7.323/55/<31/24 -Secondary to sepsis and volume depletion -No signs of ischemic colitis or enteritis on CT abdomen   Diarrhea -Stool pathogen panel--pending   ESRD -He is TTS, -last HD on 06/26/21 -Nephrology consult appreciated -Had HD 1/29 -Continue Renvela and Sensipar   Diabetes mellitus type 2 -NovoLog sliding scale -1/28 Hemoglobin A1c--8.1   Chronic atrial fibrillation/a flutter -Continue warfarin -Currently rate controlled   GERD -Continue pantoprazole   Supratherapeutic INR -Daily INR -Pharmacy to assist with Coumadin dosing -Monitor for active signs of bleeding   Chronic systolic CHF due to NICM -Echo 04/2019 with normalization of his EF.  -E[facility]/07/[facility] showed EF remains >55% -GDMT limited somewhat from ESRD   Elevated lipase -no pancreatic inflammation on CT abd -likely due to ESRD   Elevated troponin -no chest pain -  EKG unchanged from prior with RBBB -due to ESRD and demand ischemia -1/29 Echo--EF 60-65%, no WMA; RV overload       Family Communication:   son updated at bedside 1/28   Consultants:   renal   Code Status:  FULL    DVT Prophylaxis:  warfarin     Procedures: As Listed in Progress Note Above   Antibiotics: Vanc 1/28 Cefepime 1/28>>         Subjective: Patient has occasional abdominal cramping.  Denies f/c, headache, cp, sob, n/v.  Had 2 loose stools.    Objective: Vitals:   06/30/21 0800 06/30/21 0815 06/30/21 0841 06/30/21 0849  BP: 129/62 97/62  (!) 150/61  Pulse:      Resp: 15   19  Temp:  98.4 F (36.9 C)    TempSrc:  Axillary    SpO2: 100%  100% 99%  Weight:      Height:        Intake/Output Summary (Last 24 hours) at 06/30/2021 0857 Last data filed at 06/30/2021 0526 Gross per 24 hour  Intake 280.92 ml  Output 1500 ml  Net -1219.08 ml   Weight change: 1.5 kg Exam:  General:  Pt is alert, follows commands appropriately, not in acute distress HEENT: No icterus, No thrush, No neck mass, [place]/AT Cardiovascular: RRR, S1/S2, no rubs, no gallops Respiratory: bibasilar rales, R>L;  no wheeze Abdomen: Soft/+BS, non tender, non distended, no guarding Extremities: 1 + LE edema, No lymphangitis, No petechiae, No rashes, no synovitis   Data Reviewed: I have personally reviewed following labs and imaging studies Basic Metabolic Panel: Recent Labs  Lab 06/27/21 2200 06/28/21 0734 06/28/21 0736 06/29/21 0458 06/30/21 0336  NA 138 134*  --  135 134*  K 3.9 4.1  --  4.3 3.9  CL 97* 95*  --  95* 95*  CO2 22 25  --  25 26  GLUCOSE 234* 164*  --  92 107*  BUN 46* 50*  --  60* 34*  CREATININE 7.28* 7.60*  --  9.40* 6.94*  CALCIUM 8.3* 8.3*  --  8.4* 8.5*  MG  --   --  2.3  --   --   PHOS  --   --  2.7 3.9  --    Liver Function Tests: Recent Labs  Lab 06/27/21 2200 06/28/21 0734 06/29/21 0458 06/30/21 0336  AST 43* 43*  --  40  ALT 41 44  --  34  ALKPHOS 115 124  --  123  BILITOT 0.7 0.6  --  0.6  PROT 5.5* 6.8  --  7.1  ALBUMIN 2.8* 3.2* 2.7* 3.2*   Recent Labs  Lab 06/27/21 2200  LIPASE 79*   No results for input(s): AMMONIA  in the last 168 hours. Coagulation Profile: Recent Labs  Lab 06/27/21 2200 06/28/21 0734 06/29/21 0457 06/30/21 0336  INR 3.7* 3.5* 3.7* 2.4*   CBC: Recent Labs  Lab 06/27/21 2200 06/28/21 0734 06/30/21 0336  WBC 6.8 4.9 4.4  HGB 13.8 14.0 13.2  HCT 43.9 43.2 42.6  MCV 103.5* 104.1* 104.2*  PLT 122* 126* 131*   Cardiac Enzymes: No results for input(s): CKTOTAL, CKMB, CKMBINDEX, TROPONINI in the last 168 hours. BNP: Invalid input(s): POCBNP CBG: Recent Labs  Lab 06/29/21 0745 06/29/21 1130 06/29/21 1637 06/29/21 2208 06/30/21 0759  GLUCAP 82 95 101* 97 107*   HbA1C: Recent Labs    06/28/21 0734  HGBA1C 8.1*   Urine analysis:    Component  Value Date/Time   COLORURINE YELLOW 08/04/2013 [place] 08/04/2013 0249   LABSPEC 1.013 08/04/2013 0249   PHURINE 7.5 08/04/2013 0249   GLUCOSEU 500 (A) 08/04/2013 0249   HGBUR SMALL (A) 08/04/2013 0249   BILIRUBINUR NEGATIVE 08/04/2013 0249   KETONESUR NEGATIVE 08/04/2013 0249   PROTEINUR 100 (A) 08/04/2013 0249   UROBILINOGEN 0.2 08/04/2013 0249   NITRITE NEGATIVE 08/04/2013 0249   LEUKOCYTESUR NEGATIVE 08/04/2013 0249   Sepsis Labs: @LABRCNTIP (procalcitonin:4,lacticidven:4) ) Recent Results (from the past 240 hour(s))  Resp Panel by RT-PCR (Flu A&B, Covid) Nasopharyngeal Swab     Status: None   Collection Time: 06/27/21  8:42 PM   Specimen: Nasopharyngeal Swab; Nasopharyngeal(NP) swabs in vial transport medium  Result Value Ref Range Status   SARS Coronavirus 2 by RT PCR NEGATIVE NEGATIVE Final    Comment: (NOTE) SARS-CoV-2 target nucleic acids are NOT DETECTED.  The SARS-CoV-2 RNA is generally detectable in upper respiratory specimens during the acute phase of infection. The lowest concentration of SARS-CoV-2 viral copies this assay can detect is 138 copies/mL. A negative result does not preclude SARS-Cov-2 infection and should not be used as the sole basis for treatment or other patient  management decisions. A negative result may occur with  improper specimen collection/handling, submission of specimen other than nasopharyngeal swab, presence of viral mutation(s) within the areas targeted by this assay, and inadequate number of viral copies(<138 copies/mL). A negative result must be combined with clinical observations, patient history, and epidemiological information. The expected result is Negative.  Fact Sheet for Patients:  EntrepreneurPulse.com.au  Fact Sheet for Healthcare Providers:  IncredibleEmployment.be  This test is no t yet approved or cleared by the [place] FDA and  has been authorized for detection and/or diagnosis of SARS-CoV-2 by FDA under an Emergency Use Authorization (EUA). This EUA will remain  in effect (meaning this test can be used) for the duration of the COVID-19 declaration under Section 564(b)(1) of the Act, 21 U.S.C.section 360bbb-3(b)(1), unless the authorization is terminated  or revoked sooner.       Influenza A by PCR NEGATIVE NEGATIVE Final   Influenza B by PCR NEGATIVE NEGATIVE Final    Comment: (NOTE) The Xpert Xpress SARS-CoV-2/FLU/RSV plus assay is intended as an aid in the diagnosis of influenza from Nasopharyngeal swab specimens and should not be used as a sole basis for treatment. Nasal washings and aspirates are unacceptable for Xpert Xpress SARS-CoV-2/FLU/RSV testing.  Fact Sheet for Patients: EntrepreneurPulse.com.au  Fact Sheet for Healthcare Providers: IncredibleEmployment.be  This test is not yet approved or cleared by the [place] FDA and has been authorized for detection and/or diagnosis of SARS-CoV-2 by FDA under an Emergency Use Authorization (EUA). This EUA will remain in effect (meaning this test can be used) for the duration of the COVID-19 declaration under Section 564(b)(1) of the Act, 21 U.S.C. section 360bbb-3(b)(1),  unless the authorization is terminated or revoked.  Performed at [facility], [address], [place], [place] [address]   Culture, blood (Routine X 2) w Reflex to ID Panel     Status: None (Preliminary result)   Collection Time: 06/28/21  1:04 AM   Specimen: BLOOD LEFT HAND  Result Value Ref Range Status   Specimen Description BLOOD LEFT HAND  Final   Special Requests   Final    BOTTLES DRAWN AEROBIC ONLY Blood Culture adequate volume   Culture   Final    NO GROWTH 1 DAY Performed at [facility], [address], [place], [place] [address]    Report Status PENDING  Incomplete  Culture, blood (Routine X 2) w Reflex to ID Panel     Status: None (Preliminary result)   Collection Time: 06/28/21  3:15 AM   Specimen: BLOOD LEFT ARM  Result Value Ref Range Status   Specimen Description BLOOD LEFT ARM  Final   Special Requests   Final    BOTTLES DRAWN AEROBIC AND ANAEROBIC Blood Culture adequate volume   Culture   Final    NO GROWTH 1 DAY Performed at [facility], [address] [place], [place] [address]    Report Status PENDING  Incomplete  MRSA Next Gen by PCR, Nasal     Status: None   Collection Time: 06/28/21  8:33 AM   Specimen: Nasal Mucosa; Nasal Swab  Result Value Ref Range Status   MRSA by PCR Next Gen NOT DETECTED NOT DETECTED Final    Comment: (NOTE) The GeneXpert MRSA Assay (FDA approved for NASAL specimens only), is one component of a comprehensive MRSA colonization surveillance program. It is not intended to diagnose MRSA infection nor to guide or monitor treatment for MRSA infections. Test performance is not FDA approved in patients less than 10 years old. Performed at [facility], [address], [place], [place] [address]      Scheduled Meds:  Chlorhexidine Gluconate Cloth  6 each Topical Daily   Chlorhexidine Gluconate Cloth  6 each Topical Q0600   cinacalcet  30 mg Oral Q supper   dextromethorphan-guaiFENesin  1 tablet Oral BID   doxycycline  100 mg Oral  Q12H   ferrous sulfate  325 mg Oral Q breakfast   insulin aspart  0-5 Units Subcutaneous QHS   insulin aspart  0-6 Units Subcutaneous TID WC   midodrine  10 mg Oral TID WC   multivitamin  1 tablet Oral Daily   pantoprazole  40 mg Oral BID   sevelamer carbonate  800 mg Oral TID WC   warfarin  5 mg Oral ONCE-1600   Warfarin - Pharmacist Dosing Inpatient   Does not apply q1600   Continuous Infusions:  sodium chloride Stopped (06/29/21 2146)   sodium chloride     sodium chloride     ceFEPime (MAXIPIME) IV Stopped (06/29/21 2126)   norepinephrine (LEVOPHED) Adult infusion 2 mcg/min (06/30/21 0526)    Procedures/Studies: DG Chest 1 View  Result Date: 06/27/2021 CLINICAL DATA:  Shortness of breath, vomiting EXAM: CHEST  1 VIEW COMPARISON:  07/16/2018 FINDINGS: There is poor inspiration. There are patchy linear densities in both lower lung fields, more so on the left side. Costophrenic angles are clear. There is no pneumothorax. There is mild extrinsic pressure over the left lateral margin of trachea in the lower neck suggesting possible enlarged thyroid. IMPRESSION: There linear patchy densities in both lower lung fields, more so on the left side. Findings suggest possible atelectasis/pneumonia. There is poor inspiration which could account for some of the crowding of markings in the lower lung fields. There is no significant pleural effusion or pneumothorax. Electronically Signed   By: Elmer Picker [name]   On: 06/27/2021 20:38   CT CHEST ABDOMEN PELVIS W CONTRAST  Result Date: 06/28/2021 CLINICAL DATA:  Abdominal pain and dyspnea EXAM: CT CHEST, ABDOMEN, AND PELVIS WITH CONTRAST TECHNIQUE: Multidetector CT imaging of the chest, abdomen and pelvis was performed following the standard protocol during bolus administration of intravenous contrast. RADIATION DOSE REDUCTION: This exam was performed according to the departmental dose-optimization program  which includes automated exposure control,  adjustment of the mA and/or kV according to patient size and/or use of iterative reconstruction technique. CONTRAST:  156mL OMNIPAQUE IOHEXOL 350 MG/ML SOLN COMPARISON:  None. FINDINGS: CT CHEST FINDINGS Cardiovascular: Calcific aortic atherosclerosis and coronary artery calcification. No pericardial effusion. Mediastinum/Nodes: No enlarged mediastinal, hilar, or axillary lymph nodes. Thyroid gland, trachea, and esophagus demonstrate no significant findings. Lungs/Pleura: Bibasilar atelectasis. No pleural effusion or pneumothorax. Musculoskeletal: No chest wall mass or suspicious bone lesions identified. CT ABDOMEN PELVIS FINDINGS Hepatobiliary: No focal liver abnormality is seen. Status post cholecystectomy. No biliary dilatation. Pancreas: Unremarkable. No pancreatic ductal dilatation or surrounding inflammatory changes. Spleen: Normal in size without focal abnormality. Adrenals/Urinary Tract: Adrenal glands are unremarkable. Kidneys are normal, without renal calculi, focal lesion, or hydronephrosis. Bladder is unremarkable. Stomach/Bowel: Stomach is within normal limits. Appendix appears normal. No evidence of bowel wall thickening, distention, or inflammatory changes. Vascular/Lymphatic: Infrarenal IVC filter. Calcific aortic atherosclerosis. No lymphadenopathy. Reproductive: Prostate is unremarkable. Other: Small fat containing ventral abdominal hernia. Musculoskeletal: No acute or significant osseous findings. IMPRESSION: 1. No acute abnormality of the chest, abdomen or pelvis. Aortic Atherosclerosis (ICD10-I70.0). Electronically Signed   By: Ulyses Jarred [name]   On: 06/28/2021 01:54   ECHOCARDIOGRAM COMPLETE  Result Date: 06/28/2021    ECHOCARDIOGRAM REPORT   Patient Name:   REYNARD CHRISTOFFERSEN Date of Exam: 06/28/2021 Medical Rec #:  [id number]     Height:       75.0 in Accession #:    [id number]    Weight:       251.5 lb Date of Birth:  09/19/1950    BSA:          2.419 m Patient Age:    89 years      BP:            136/45 mmHg Patient Gender: M             HR:           67 bpm. Exam Location:  [facility] Procedure: 2D Echo, Cardiac Doppler and Color Doppler Indications:    CHF-Acute Diastolic U93.23  History:        Patient has prior history of Echocardiogram examinations, most                 recent 04/07/2019. COPD, Arrythmias:Atrial Fibrillation and                 Atrial Flutter; Risk Factors:Hypertension, Diabetes,                 Dyslipidemia and Sleep Apnea.  Sonographer:    Bernadene Person RDCS Referring Phys: [id number] OLADAPO ADEFESO IMPRESSIONS  1. Left ventricular ejection fraction, by estimation, is 60 to 65%. The left ventricle has normal function. The left ventricle has no regional wall motion abnormalities. There is moderate asymmetric left ventricular hypertrophy of the septal segment. Left ventricular diastolic parameters are indeterminate.  2. Ventricular septum is flattened in systole suggesting RV pressure overload. . Right ventricular systolic function was not well visualized. The right ventricular size is not well visualized. There is moderately elevated pulmonary artery systolic pressure.  3. The mitral valve is normal in structure. No evidence of mitral valve regurgitation. No evidence of mitral stenosis.  4. The aortic valve is tricuspid. There is moderate calcification of the aortic valve. There is moderate thickening of the aortic valve. Aortic valve regurgitation is not visualized. No aortic stenosis is present.  5. Aortic dilatation  noted. There is mild dilatation of the ascending aorta, measuring 40 mm.  6. The inferior vena cava is dilated in size with >50% respiratory variability, suggesting right atrial pressure of 8 mmHg. FINDINGS  Left Ventricle: Left ventricular ejection fraction, by estimation, is 60 to 65%. The left ventricle has normal function. The left ventricle has no regional wall motion abnormalities. The left ventricular internal cavity size was normal in size. There is  moderate  asymmetric left ventricular hypertrophy of the septal segment. Left ventricular diastolic parameters are indeterminate. Right Ventricle: Ventricular septum is flattened in systole suggesting RV pressure overload. The right ventricular size is not well visualized. Right vetricular wall thickness was not well visualized. Right ventricular systolic function was not well visualized. There is moderately elevated pulmonary artery systolic pressure. The tricuspid regurgitant velocity is 3.20 m/s, and with an assumed right atrial pressure of 8 mmHg, the estimated right ventricular systolic pressure is 19.4 mmHg. Left Atrium: Left atrial size was normal in size. Right Atrium: Right atrial size was normal in size. Pericardium: There is no evidence of pericardial effusion. Mitral Valve: The mitral valve is normal in structure. There is mild thickening of the mitral valve leaflet(s). There is mild calcification of the mitral valve leaflet(s). Mild mitral annular calcification. No evidence of mitral valve regurgitation. No evidence of mitral valve stenosis. Tricuspid Valve: The tricuspid valve is not well visualized. Tricuspid valve regurgitation is mild . No evidence of tricuspid stenosis. Aortic Valve: The aortic valve is tricuspid. There is moderate calcification of the aortic valve. There is moderate thickening of the aortic valve. There is mild aortic valve annular calcification. Aortic valve regurgitation is not visualized. No aortic stenosis is present. Aortic valve mean gradient measures 4.4 mmHg. Aortic valve peak gradient measures 9.0 mmHg. Aortic valve area, by VTI measures 2.80 cm. Pulmonic Valve: The pulmonic valve was not well visualized. Pulmonic valve regurgitation is not visualized. No evidence of pulmonic stenosis. Aorta: Aortic dilatation noted and the aortic root is normal in size and structure. There is mild dilatation of the ascending aorta, measuring 40 mm. Venous: The inferior vena cava is dilated in  size with greater than 50% respiratory variability, suggesting right atrial pressure of 8 mmHg. IAS/Shunts: No atrial level shunt detected by color flow Doppler.  LEFT VENTRICLE PLAX 2D LVIDd:         4.40 cm LVIDs:         2.90 cm LV PW:         1.10 cm LV IVS:        1.40 cm LVOT diam:     2.30 cm LV SV:         76 LV SV Index:   31 LVOT Area:     4.15 cm  LV Volumes (MOD) LV vol d, MOD A2C: 96.2 ml LV vol d, MOD A4C: 96.1 ml LV vol s, MOD A2C: 39.7 ml LV vol s, MOD A4C: 42.6 ml LV SV MOD A2C:     56.5 ml LV SV MOD A4C:     96.1 ml LV SV MOD BP:      53.0 ml RIGHT VENTRICLE RV S prime:     6.89 cm/s TAPSE (M-mode): 2.2 cm LEFT ATRIUM             Index        RIGHT ATRIUM           Index LA diam:        4.00 cm 1.65 cm/m  RA Area:     18.90 cm LA Vol (A2C):   63.1 ml 26.09 ml/m  RA Volume:   49.70 ml  20.55 ml/m LA Vol (A4C):   58.1 ml 24.02 ml/m LA Biplane Vol: 64.9 ml 26.83 ml/m  AORTIC VALVE AV Area (Vmax):    2.69 cm AV Area (Vmean):   2.62 cm AV Area (VTI):     2.80 cm AV Vmax:           149.62 cm/s AV Vmean:          98.265 cm/s AV VTI:            0.270 m AV Peak Grad:      9.0 mmHg AV Mean Grad:      4.4 mmHg LVOT Vmax:         96.83 cm/s LVOT Vmean:        61.867 cm/s LVOT VTI:          0.182 m LVOT/AV VTI ratio: 0.67  AORTA Ao Root diam: 3.90 cm Ao Asc diam:  4.00 cm TRICUSPID VALVE TR Peak grad:   41.0 mmHg TR Vmax:        320.00 cm/s  SHUNTS Systemic VTI:  0.18 m Systemic Diam: 2.30 cm Carlyle Dolly MD Electronically signed by Carlyle Dolly MD Signature Date/Time: 06/28/2021/3:34:40 PM    Final     Orson Eva, DO  [facility]  If 7PM-7AM, please contact night-coverage [email or web address] Password [email or web address] 06/30/2021, 8:57 AM   LOS: 2 days

## 2021-06-30 NOTE — Progress Notes (Signed)
Dr Tat made aware of Troponin being 483 this morning.

## 2021-06-30 NOTE — Progress Notes (Signed)
ANTICOAGULATION CONSULT NOTE -    Pharmacy Consult for warfarin dosing  Indication: atrial fibrillation   No Known Allergies    Patient Measurements: Last Weight  Most recent update: 06/30/2021  6:09 AM    Weight  114.4 kg (252 lb 3.3 oz)            Body mass index is 31.52 kg/m. [name]               Temp: 98.4 F (36.9 C) (01/30 0815) Temp Source: Axillary (01/30 0815) BP: 97/62 (01/30 0815)  Labs: Recent Labs    06/27/21 2200 06/28/21 0734 06/29/21 0457 06/29/21 0458 06/30/21 0336  HGB 13.8 14.0  --   --  13.2  HCT 43.9 43.2  --   --  42.6  PLT 122* 126*  --   --  131*  LABPROT 36.9* 34.8* 36.7*  --  25.9*  INR 3.7* 3.5* 3.7*  --  2.4*  CREATININE 7.28* 7.60*  --  9.40* 6.94*     Estimated Creatinine Clearance: 13.9 mL/min (A) (by C-G formula based on SCr of 6.94 mg/dL (H)).     Medications:  Medications Prior to Admission  Medication Sig Dispense Refill Last Dose   acetaminophen (TYLENOL) 500 MG tablet Take 500-1,000 mg by mouth daily as needed (back pain).   unknown   B Complex-C-Folic Acid (RENAL MULTIVITAMIN FORMULA) TABS Take 1 tablet by mouth daily. Renal Tab II   Past Week   Cholecalciferol (VITAMIN D3) 125 MCG (5000 UT) TABS Take 5,000 Units by mouth daily.   Past Week   cinacalcet (SENSIPAR) 30 MG tablet Take 30 mg by mouth every evening.   Past Week   diclofenac Sodium (VOLTAREN) 1 % GEL Apply 2 g topically 4 (four) times daily.   unknown   doxercalciferol (HECTOROL) 4 MCG/2ML injection Inject 2.5 mLs (5 mcg total) into the vein Every Tuesday,Thursday,and Saturday with dialysis. 2 mL  06/26/2021   enoxaparin (LOVENOX) 120 MG/0.8ML injection Inject 120 mg into the skin every 12 (twelve) hours.   Past Week at unknown   ferrous sulfate 325 (65 FE) MG tablet Take 325 mg by mouth daily with breakfast.   Past Week   insulin aspart (NOVOLOG) 100 UNIT/ML injection Inject 1-9 Units into the skin 3 (three) times daily before meals.   Past Week    insulin lispro (HUMALOG) 100 UNIT/ML KwikPen Inject 10 Units into the skin 2 (two) times daily.   Past Week   iron polysaccharides (NIFEREX) 150 MG capsule Take 150 mg by mouth daily.   Past Week   lidocaine-prilocaine (EMLA) cream Apply 1 application topically as needed (Apply small amount to access site 1-2 hours before dialysis. Cover with occlusive dressing (saran wrap)).    Past Week   midodrine (PROAMATINE) 5 MG tablet Take 5 mg by mouth 2 (two) times daily.   Past Week   multivitamin (RENA-VIT) TABS tablet Take 1 tablet by mouth daily.   Past Week   oxyCODONE-acetaminophen (PERCOCET) 5-325 MG tablet Take 1 tablet by mouth every 6 (six) hours as needed for severe pain. 8 tablet 0 unknown   pantoprazole (PROTONIX) 40 MG tablet Take 1 tablet (40 mg total) by mouth 2 (two) times daily. 60 tablet 1 Past Week   sevelamer carbonate (RENVELA) 800 MG tablet Take 800 mg by mouth 3 (three) times daily with meals.   Past Week   vitamin C (ASCORBIC ACID) 500 MG tablet Take 500 mg by mouth daily.  Past Week   warfarin (COUMADIN) 5 MG tablet Take 5 mg by mouth daily. Take 1 &1/2 (7.5mg ) tablets on Mon,Wed,Fri,Sat and Sunday and then on Tues and Thursday take 1(5mg) tablet   Past Week at unknown   zinc sulfate 220 (50 Zn) MG capsule Take 220 mg by mouth daily.   Past Week   Scheduled:   Chlorhexidine Gluconate Cloth  6 each Topical Daily   Chlorhexidine Gluconate Cloth  6 each Topical Q0600   cinacalcet  30 mg Oral Q supper   dextromethorphan-guaiFENesin  1 tablet Oral BID   doxycycline  100 mg Oral Q12H   ferrous sulfate  325 mg Oral Q breakfast   insulin aspart  0-5 Units Subcutaneous QHS   insulin aspart  0-6 Units Subcutaneous TID WC   midodrine  10 mg Oral TID WC   multivitamin  1 tablet Oral Daily   pantoprazole  40 mg Oral BID   sevelamer carbonate  800 mg Oral TID WC   Infusions:   sodium chloride Stopped (06/29/21 2146)   sodium chloride     sodium chloride     ceFEPime (MAXIPIME) IV  Stopped (06/29/21 2126)   norepinephrine (LEVOPHED) Adult infusion 2 mcg/min (06/30/21 0526)   PRN: sodium chloride, sodium chloride, acetaminophen **OR** acetaminophen, acetaminophen, lidocaine (PF), lidocaine-prilocaine, pentafluoroprop-tetrafluoroeth Anti-infectives (From admission, onward)    Start     Dose/Rate Route Frequency Ordered Stop   06/28/21 2200  ceFEPIme (MAXIPIME) 1 g in sodium chloride 0.9 % 100 mL IVPB        1 g 200 mL/hr over 30 Minutes Intravenous Every 24 hours 06/28/21 0457     06/28/21 1200  vancomycin (VANCOCIN) IVPB 1000 mg/200 mL premix  Status:  Discontinued        1,000 mg 200 mL/hr over 60 Minutes Intravenous Every T-Th-Sa (Hemodialysis) 06/28/21 0457 06/29/21 1036   06/28/21 1100  doxycycline (VIBRA-TABS) tablet 100 mg        100 mg Oral Every 12 hours 06/28/21 1014     01 /28/23 0230  vancomycin (VANCOREADY) IVPB 2000 mg/400 mL        2,000 mg 200 mL/hr over 120 Minutes Intravenous  Once 06/28/21 0225 06/28/21 0501   06/28/21 0230  ceFEPIme (MAXIPIME) 2 g in sodium chloride 0.9 % 100 mL IVPB        2 g 200 mL/hr over 30 Minutes Intravenous  Once 06/28/21 0225 06/28/21 0340       Goal of Therapy:  INR 2-3 Monitor platelets by anticoagulation protocol: Yes    Prior to Admission Warfarin Dosing:  [name] takes 7.5mg  of warfarin daily       Admit INR was 3.7 Lab Results  Component Value Date   INR 2.4 (H) 06/30/2021   INR 3.7 (H) 06/29/2021   INR 3.5 (H) 06/28/2021    Assessment: JORJE [name] a 71 y.o. male requires anticoagulation with warfarin for the indication of  atrial fibrillation. Warfarin will be initiated inpatient following pharmacy protocol per pharmacy consult. Patient most recent blood work is as follows: CBC Latest Ref Rng & Units 06/30/2021 06/28/2021 06/27/2021  WBC 4.0 - 10.5 K/uL 4.4 4.9 6.8  Hemoglobin 13.0 - 17.0 g/dL 13.2 14.0 13.8  Hematocrit 39.0 - 52.0 % 42.6 43.2 43.9  Platelets 150 - 400 K/uL 131(L) 126(L)  122(L)   INR 2.4, now within the therapeutic range  Plan: Warfarin 5mg  po x 1 today Monitor CBC MWF with am labs   Monitor INR  daily Monitor for signs and symptoms of bleeding   Isac Sarna, BS [place], BCPS Clinical Pharmacist Pager [phone]

## 2021-06-30 NOTE — Progress Notes (Signed)
Patient ID: TOBIE HELLEN, male   DOB: 17-Jul-1950, 71 y.o.   MRN: [id number] S:Feeling better this morning.  No complaints. O:BP 97/62    Pulse 70    Temp 98.4 F (36.9 C) (Axillary)    Resp 15    Ht 6\' 3"  (1.905 m)    Wt 114.4 kg    SpO2 100%    BMI 31.52 kg/m   Intake/Output Summary (Last 24 hours) at 06/30/2021 0834 Last data filed at 06/30/2021 0526 Gross per 24 hour  Intake 280.92 ml  Output 1500 ml  Net -1219.08 ml   Intake/Output: I/O last 3 completed shifts: In: 521 [I.V.:321; IV Piggyback:200] Out: 1500 [Other:1500]  Intake/Output this shift:  No intake/output data recorded. Weight change: 1.5 kg Gen: NAD CVS: RRR Resp: CTA Abd: +BS, soft, NT/ND Ext: no edema, RUE AVF +T/B  Recent Labs  Lab 06/27/21 2200 06/28/21 0734 06/28/21 0736 06/29/21 0458 06/30/21 0336  NA 138 134*  --  135 134*  K 3.9 4.1  --  4.3 3.9  CL 97* 95*  --  95* 95*  CO2 22 25  --  25 26  GLUCOSE 234* 164*  --  92 107*  BUN 46* 50*  --  60* 34*  CREATININE 7.28* 7.60*  --  9.40* 6.94*  ALBUMIN 2.8* 3.2*  --  2.7* 3.2*  CALCIUM 8.3* 8.3*  --  8.4* 8.5*  PHOS  --   --  2.7 3.9  --   AST 43* 43*  --   --  40  ALT 41 44  --   --  34   Liver Function Tests: Recent Labs  Lab 06/27/21 2200 06/28/21 0734 06/29/21 0458 06/30/21 0336  AST 43* 43*  --  40  ALT 41 44  --  34  ALKPHOS 115 124  --  123  BILITOT 0.7 0.6  --  0.6  PROT 5.5* 6.8  --  7.1  ALBUMIN 2.8* 3.2* 2.7* 3.2*   Recent Labs  Lab 06/27/21 2200  LIPASE 79*   No results for input(s): AMMONIA in the last 168 hours. CBC: Recent Labs  Lab 06/27/21 2200 06/28/21 0734 06/30/21 0336  WBC 6.8 4.9 4.4  HGB 13.8 14.0 13.2  HCT 43.9 43.2 42.6  MCV 103.5* 104.1* 104.2*  PLT 122* 126* 131*   Cardiac Enzymes: No results for input(s): CKTOTAL, CKMB, CKMBINDEX, TROPONINI in the last 168 hours. CBG: Recent Labs  Lab 06/29/21 0745 06/29/21 1130 06/29/21 1637 06/29/21 2208 06/30/21 0759  GLUCAP 82 95 101* 97 107*     Iron Studies: No results for input(s): IRON, TIBC, TRANSFERRIN, FERRITIN in the last 72 hours. Studies/Results: ECHOCARDIOGRAM COMPLETE  Result Date: 06/28/2021    ECHOCARDIOGRAM REPORT   Patient Name:   [facility] Date of Exam: 06/28/2021 Medical Rec #:  [id number]     Height:       75.0 in Accession #:    [id number]    Weight:       251.5 lb Date of Birth:  1951-04-27    BSA:          2.419 m Patient Age:    17 years      BP:           136/45 mmHg Patient Gender: M             HR:           67 bpm. Exam Location:  [facility] Procedure: 2D Echo, Cardiac  Doppler and Color Doppler Indications:    CHF-Acute Diastolic C14.48  History:        Patient has prior history of Echocardiogram examinations, most                 recent 04/07/2019. COPD, Arrythmias:Atrial Fibrillation and                 Atrial Flutter; Risk Factors:Hypertension, Diabetes,                 Dyslipidemia and Sleep Apnea.  Sonographer:    Bernadene Person RDCS Referring Phys: [id number] OLADAPO ADEFESO IMPRESSIONS  1. Left ventricular ejection fraction, by estimation, is 60 to 65%. The left ventricle has normal function. The left ventricle has no regional wall motion abnormalities. There is moderate asymmetric left ventricular hypertrophy of the septal segment. Left ventricular diastolic parameters are indeterminate.  2. Ventricular septum is flattened in systole suggesting RV pressure overload. . Right ventricular systolic function was not well visualized. The right ventricular size is not well visualized. There is moderately elevated pulmonary artery systolic pressure.  3. The mitral valve is normal in structure. No evidence of mitral valve regurgitation. No evidence of mitral stenosis.  4. The aortic valve is tricuspid. There is moderate calcification of the aortic valve. There is moderate thickening of the aortic valve. Aortic valve regurgitation is not visualized. No aortic stenosis is present.  5. Aortic dilatation noted. There is mild  dilatation of the ascending aorta, measuring 40 mm.  6. The inferior vena cava is dilated in size with >50% respiratory variability, suggesting right atrial pressure of 8 mmHg. FINDINGS  Left Ventricle: Left ventricular ejection fraction, by estimation, is 60 to 65%. The left ventricle has normal function. The left ventricle has no regional wall motion abnormalities. The left ventricular internal cavity size was normal in size. There is  moderate asymmetric left ventricular hypertrophy of the septal segment. Left ventricular diastolic parameters are indeterminate. Right Ventricle: Ventricular septum is flattened in systole suggesting RV pressure overload. The right ventricular size is not well visualized. Right vetricular wall thickness was not well visualized. Right ventricular systolic function was not well visualized. There is moderately elevated pulmonary artery systolic pressure. The tricuspid regurgitant velocity is 3.20 m/s, and with an assumed right atrial pressure of 8 mmHg, the estimated right ventricular systolic pressure is 97.0 mmHg. Left Atrium: Left atrial size was normal in size. Right Atrium: Right atrial size was normal in size. Pericardium: There is no evidence of pericardial effusion. Mitral Valve: The mitral valve is normal in structure. There is mild thickening of the mitral valve leaflet(s). There is mild calcification of the mitral valve leaflet(s). Mild mitral annular calcification. No evidence of mitral valve regurgitation. No evidence of mitral valve stenosis. Tricuspid Valve: The tricuspid valve is not well visualized. Tricuspid valve regurgitation is mild . No evidence of tricuspid stenosis. Aortic Valve: The aortic valve is tricuspid. There is moderate calcification of the aortic valve. There is moderate thickening of the aortic valve. There is mild aortic valve annular calcification. Aortic valve regurgitation is not visualized. No aortic stenosis is present. Aortic valve mean gradient  measures 4.4 mmHg. Aortic valve peak gradient measures 9.0 mmHg. Aortic valve area, by VTI measures 2.80 cm. Pulmonic Valve: The pulmonic valve was not well visualized. Pulmonic valve regurgitation is not visualized. No evidence of pulmonic stenosis. Aorta: Aortic dilatation noted and the aortic root is normal in size and structure. There is mild dilatation of the ascending aorta,  measuring 40 mm. Venous: The inferior vena cava is dilated in size with greater than 50% respiratory variability, suggesting right atrial pressure of 8 mmHg. IAS/Shunts: No atrial level shunt detected by color flow Doppler.  LEFT VENTRICLE PLAX 2D LVIDd:         4.40 cm LVIDs:         2.90 cm LV PW:         1.10 cm LV IVS:        1.40 cm LVOT diam:     2.30 cm LV SV:         76 LV SV Index:   31 LVOT Area:     4.15 cm  LV Volumes (MOD) LV vol d, MOD A2C: 96.2 ml LV vol d, MOD A4C: 96.1 ml LV vol s, MOD A2C: 39.7 ml LV vol s, MOD A4C: 42.6 ml LV SV MOD A2C:     56.5 ml LV SV MOD A4C:     96.1 ml LV SV MOD BP:      53.0 ml RIGHT VENTRICLE RV S prime:     6.89 cm/s TAPSE (M-mode): 2.2 cm LEFT ATRIUM             Index        RIGHT ATRIUM           Index LA diam:        4.00 cm 1.65 cm/m   RA Area:     18.90 cm LA Vol (A2C):   63.1 ml 26.09 ml/m  RA Volume:   49.70 ml  20.55 ml/m LA Vol (A4C):   58.1 ml 24.02 ml/m LA Biplane Vol: 64.9 ml 26.83 ml/m  AORTIC VALVE AV Area (Vmax):    2.69 cm AV Area (Vmean):   2.62 cm AV Area (VTI):     2.80 cm AV Vmax:           149.62 cm/s AV Vmean:          98.265 cm/s AV VTI:            0.270 m AV Peak Grad:      9.0 mmHg AV Mean Grad:      4.4 mmHg LVOT Vmax:         96.83 cm/s LVOT Vmean:        61.867 cm/s LVOT VTI:          0.182 m LVOT/AV VTI ratio: 0.67  AORTA Ao Root diam: 3.90 cm Ao Asc diam:  4.00 cm TRICUSPID VALVE TR Peak grad:   41.0 mmHg TR Vmax:        320.00 cm/s  SHUNTS Systemic VTI:  0.18 m Systemic Diam: 2.30 cm Carlyle Dolly MD Electronically signed by Carlyle Dolly MD  Signature Date/Time: 06/28/2021/3:34:40 PM    Final     Chlorhexidine Gluconate Cloth  6 each Topical Daily   Chlorhexidine Gluconate Cloth  6 each Topical Q0600   cinacalcet  30 mg Oral Q supper   dextromethorphan-guaiFENesin  1 tablet Oral BID   doxycycline  100 mg Oral Q12H   ferrous sulfate  325 mg Oral Q breakfast   insulin aspart  0-5 Units Subcutaneous QHS   insulin aspart  0-6 Units Subcutaneous TID WC   midodrine  10 mg Oral TID WC   multivitamin  1 tablet Oral Daily   pantoprazole  40 mg Oral BID   sevelamer carbonate  800 mg Oral TID WC    BMET    Component Value  Date/Time   NA 134 (L) 06/30/2021 0336   NA 142 09/04/2016 1628   K 3.9 06/30/2021 0336   CL 95 (L) 06/30/2021 0336   CO2 26 06/30/2021 0336   GLUCOSE 107 (H) 06/30/2021 0336   BUN 34 (H) 06/30/2021 0336   BUN 22 09/04/2016 1628   CREATININE 6.94 (H) 06/30/2021 0336   CALCIUM 8.5 (L) 06/30/2021 0336   CALCIUM 9.1 01/09/2013 0903   GFRNONAA 8 (L) 06/30/2021 0336   GFRAA 16 (L) 07/16/2018 2320   CBC    Component Value Date/Time   WBC 4.4 06/30/2021 0336   RBC 4.09 (L) 06/30/2021 0336   HGB 13.2 06/30/2021 0336   HGB 11.7 (L) 09/04/2016 1628   HGB 9.4 (L) 09/29/2011 1230   HCT 42.6 06/30/2021 0336   HCT 36.3 (L) 09/04/2016 1628   HCT 28.9 (L) 09/29/2011 1230   PLT 131 (L) 06/30/2021 0336   PLT 153 09/04/2016 1628   MCV 104.2 (H) 06/30/2021 0336   MCV 84 09/04/2016 1628   MCV 82.6 09/29/2011 1230   MCH 32.3 06/30/2021 0336   MCHC 31.0 06/30/2021 0336   RDW 16.3 (H) 06/30/2021 0336   RDW 18.2 (H) 09/04/2016 1628   RDW 17.2 (H) 09/29/2011 1230   LYMPHSABS 1.1 07/17/2020 1145   LYMPHSABS 0.9 09/04/2016 1628   LYMPHSABS 1.2 09/29/2011 1230   MONOABS 0.3 07/17/2020 1145   MONOABS 0.5 09/29/2011 1230   EOSABS 0.1 07/17/2020 1145   EOSABS 0.1 09/04/2016 1628   BASOSABS 0.0 07/17/2020 1145   BASOSABS 0.0 09/04/2016 1628   BASOSABS 0.0 09/29/2011 1230   Dialyzes at [facility] 4 hours  and 15 minutes   EDW 110.5. HD Bath 2/2.5, Dialyzer 250, Heparin none. Access AVF-  15 guage. Also low temp and profile #2   left out 0.6 over EDW on 1/26  last hgb 13.3  Assessment/Plan:  Sepsis - unclear etiology, required pressor support.  Possible lobar pneumonia but CT scan negative.  Has diarrhea and GI panel pending. Blood cultures negative to date.  Wean pressors as able.  ESRD - off schedule, will plan on HD tomorrow to get back on TTS schedule.  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 Chronic systolic CHF due to NICM - per primary DM type 2 - per primary Chronic atrial fibrillation/flutter - on coumadin.  INR 2.4 this am.   Donetta Potts, MD [facility] [phone]

## 2021-06-30 NOTE — Progress Notes (Signed)
Levo turned off at 0901 this morning. Blood pressures monitored off Levo with good results. Levo continues to be off at this time. Dr Tat updated.

## 2021-07-01 DIAGNOSIS — A046 Enteritis due to Yersinia enterocolitica: Secondary | ICD-10-CM

## 2021-07-01 LAB — RENAL FUNCTION PANEL
Albumin: 2.7 g/dL — ABNORMAL LOW (ref 3.5–5.0)
Anion gap: 15 (ref 5–15)
BUN: 45 mg/dL — ABNORMAL HIGH (ref 8–23)
CO2: 21 mmol/L — ABNORMAL LOW (ref 22–32)
Calcium: 8.5 mg/dL — ABNORMAL LOW (ref 8.9–10.3)
Chloride: 95 mmol/L — ABNORMAL LOW (ref 98–111)
Creatinine, Ser: 9.19 mg/dL — ABNORMAL HIGH (ref 0.61–1.24)
GFR, Estimated: 6 mL/min — ABNORMAL LOW (ref >60)
Glucose, Bld: 155 mg/dL — ABNORMAL HIGH (ref 70–99)
Phosphorus: 3.4 mg/dL (ref 2.5–4.6)
Potassium: 3.8 mmol/L (ref 3.5–5.1)
Sodium: 131 mmol/L — ABNORMAL LOW (ref 135–145)

## 2021-07-01 LAB — GLUCOSE, CAPILLARY
Glucose-Capillary: 100 mg/dL — ABNORMAL HIGH (ref 70–99)
Glucose-Capillary: 129 mg/dL — ABNORMAL HIGH (ref 70–99)
Glucose-Capillary: 111 mg/dL — ABNORMAL HIGH (ref 70–99)

## 2021-07-01 LAB — CBC
HCT: 34.7 % — ABNORMAL LOW (ref 39.0–52.0)
Hemoglobin: 11.8 g/dL — ABNORMAL LOW (ref 13.0–17.0)
MCH: 33.7 pg (ref 26.0–34.0)
MCHC: 34 g/dL (ref 30.0–36.0)
MCV: 99.1 fL (ref 80.0–100.0)
Platelets: 134 10*3/uL — ABNORMAL LOW (ref 150–400)
RBC: 3.5 MIL/uL — ABNORMAL LOW (ref 4.22–5.81)
RDW: 15.6 % — ABNORMAL HIGH (ref 11.5–15.5)
WBC: 3.5 10*3/uL — ABNORMAL LOW (ref 4.0–10.5)
nRBC: 0 % (ref 0.0–0.2)

## 2021-07-01 LAB — PROTIME-INR
INR: 1.7 — ABNORMAL HIGH (ref 0.8–1.2)
Prothrombin Time: 20.1 seconds — ABNORMAL HIGH (ref 11.4–15.2)

## 2021-07-01 MED ORDER — MIDODRINE HCL 10 MG PO TABS: 10.0000 mg | ORAL_TABLET | ORAL | 1 refills | Status: AC

## 2021-07-01 MED ORDER — DOXYCYCLINE HYCLATE 100 MG PO TABS: 100.0000 mg | ORAL_TABLET | Freq: Two times a day (BID) | ORAL | 0 refills | Status: AC

## 2021-07-01 MED ORDER — PROCHLORPERAZINE EDISYLATE 10 MG/2ML IJ SOLN
5.0000 mg | Freq: Four times a day (QID) | INTRAMUSCULAR | Status: DC | PRN
  Administered 2021-07-01: 5 mg via INTRAVENOUS
  Filled 2021-07-01: qty 2

## 2021-07-01 MED ORDER — CEFDINIR 300 MG PO CAPS: 300.0000 mg | ORAL_CAPSULE | Freq: Every day | ORAL | 0 refills | Status: AC

## 2021-07-01 MED ORDER — WARFARIN SODIUM 7.5 MG PO TABS
7.5000 mg | ORAL_TABLET | Freq: Once | ORAL | Status: AC
  Administered 2021-07-01: 7.5 mg via ORAL
  Filled 2021-07-01: qty 1

## 2021-07-01 MED ORDER — MIDODRINE HCL 5 MG PO TABS
10.0000 mg | ORAL_TABLET | ORAL | Status: DC
  Administered 2021-07-01: 10 mg via ORAL
  Filled 2021-07-01: qty 2

## 2021-07-01 NOTE — Progress Notes (Signed)
Patient ID: [name], male   DOB: [birth year], 71 y.o.   MRN: [id number] S: Feeling better but had some N/V last night.  Off of pressors since yesterday morning. O:BP 140/76    Pulse (!) 113    Temp (!) 97.5 F (36.4 C) (Axillary)    Resp 20    Ht 6\' 3"  (1.905 m)    Wt 115.6 kg    SpO2 98%    BMI 31.85 kg/m   Intake/Output Summary (Last 24 hours) at 07/01/2021 0843 Last data filed at 06/30/2021 1527 Gross per 24 hour  Intake 64.42 ml  Output --  Net 64.42 ml   Intake/Output: I/O last 3 completed shifts: In: 265.3 [I.V.:165.3; IV Piggyback:100] Out: 1500 [Other:1500]  Intake/Output this shift:  No intake/output data recorded. Weight change: 0 kg Gen:NAD CVS: tachy at 113 Resp:CTA Abd: +BS, soft, NT/ND Ext: no edema, RUE AVF +T/B  Recent Labs  Lab 06/27/21 2200 06/28/21 0734 06/28/21 0736 06/29/21 0458 06/30/21 0336  NA 138 134*  --  135 134*  K 3.9 4.1  --  4.3 3.9  CL 97* 95*  --  95* 95*  CO2 22 25  --  25 26  GLUCOSE 234* 164*  --  92 107*  BUN 46* 50*  --  60* 34*  CREATININE 7.28* 7.60*  --  9.40* 6.94*  ALBUMIN 2.8* 3.2*  --  2.7* 3.2*  CALCIUM 8.3* 8.3*  --  8.4* 8.5*  PHOS  --   --  2.7 3.9  --   AST 43* 43*  --   --  40  ALT 41 44  --   --  34   Liver Function Tests: Recent Labs  Lab 06/27/21 2200 06/28/21 0734 06/29/21 0458 06/30/21 0336  AST 43* 43*  --  40  ALT 41 44  --  34  ALKPHOS 115 124  --  123  BILITOT 0.7 0.6  --  0.6  PROT 5.5* 6.8  --  7.1  ALBUMIN 2.8* 3.2* 2.7* 3.2*   Recent Labs  Lab 06/27/21 2200  LIPASE 79*   No results for input(s): AMMONIA in the last 168 hours. CBC: Recent Labs  Lab 06/27/21 2200 06/28/21 0734 06/30/21 0336  WBC 6.8 4.9 4.4  HGB 13.8 14.0 13.2  HCT 43.9 43.2 42.6  MCV 103.5* 104.1* 104.2*  PLT 122* 126* 131*   Cardiac Enzymes: No results for input(s): CKTOTAL, CKMB, CKMBINDEX, TROPONINI in the last 168 hours. CBG: Recent Labs  Lab 06/30/21 0759 06/30/21 1129 06/30/21 1701 06/30/21 2147  07/01/21 0733  GLUCAP 107* 78 96 114* 100*    Iron Studies: No results for input(s): IRON, TIBC, TRANSFERRIN, FERRITIN in the last 72 hours. Studies/Results: No results found.  Chlorhexidine Gluconate Cloth  6 each Topical Daily   Chlorhexidine Gluconate Cloth  6 each Topical Q0600   cinacalcet  30 mg Oral Q supper   dextromethorphan-guaiFENesin  1 tablet Oral BID   doxycycline  100 mg Oral Q12H   ferrous sulfate  325 mg Oral Q breakfast   insulin aspart  0-5 Units Subcutaneous QHS   insulin aspart  0-6 Units Subcutaneous TID WC   multivitamin  1 tablet Oral Daily   pantoprazole  40 mg Oral BID   sevelamer carbonate  800 mg Oral TID WC   warfarin  7.5 mg Oral ONCE-1600   Warfarin - Pharmacist Dosing Inpatient   Does not apply q1600    BMET    Component Value  Date/Time   NA 134 (L) 06/30/2021 0336   NA 142 09/04/2016 1628   K 3.9 06/30/2021 0336   CL 95 (L) 06/30/2021 0336   CO2 26 06/30/2021 0336   GLUCOSE 107 (H) 06/30/2021 0336   BUN 34 (H) 06/30/2021 0336   BUN 22 09/04/2016 1628   CREATININE 6.94 (H) 06/30/2021 0336   CALCIUM 8.5 (L) 06/30/2021 0336   CALCIUM 9.1 01/09/2013 0903   GFRNONAA 8 (L) 06/30/2021 0336   GFRAA 16 (L) 07/16/2018 2320   CBC    Component Value Date/Time   WBC 4.4 06/30/2021 0336   RBC 4.09 (L) 06/30/2021 0336   HGB 13.2 06/30/2021 0336   HGB 11.7 (L) 09/04/2016 1628   HGB 9.4 (L) 09/29/2011 1230   HCT 42.6 06/30/2021 0336   HCT 36.3 (L) 09/04/2016 1628   HCT 28.9 (L) 09/29/2011 1230   PLT 131 (L) 06/30/2021 0336   PLT 153 09/04/2016 1628   MCV 104.2 (H) 06/30/2021 0336   MCV 84 09/04/2016 1628   MCV 82.6 09/29/2011 1230   MCH 32.3 06/30/2021 0336   MCHC 31.0 06/30/2021 0336   RDW 16.3 (H) 06/30/2021 0336   RDW 18.2 (H) 09/04/2016 1628   RDW 17.2 (H) 09/29/2011 1230   LYMPHSABS 1.1 07/17/2020 1145   LYMPHSABS 0.9 09/04/2016 1628   LYMPHSABS 1.2 09/29/2011 1230   MONOABS 0.3 07/17/2020 1145   MONOABS 0.5 09/29/2011 1230    EOSABS 0.1 07/17/2020 1145   EOSABS 0.1 09/04/2016 1628   BASOSABS 0.0 07/17/2020 1145   BASOSABS 0.0 09/04/2016 1628   BASOSABS 0.0 09/29/2011 1230    Dialyzes at [facility] 4 hours and 15 minutes   EDW 110.5. HD Bath 2/2.5, Dialyzer 250, Heparin none. Access AVF-  15 guage. Also low temp and profile #2   left out 0.6 over EDW on 1/26  last hgb 13.3   Assessment/Plan:   Sepsis - unclear etiology, required pressor support.  Possible lobar pneumonia but CT scan negative. Blood cultures negative to date.  Weaned off of pressors on morning of 06/30/21.  Midodrine stopped due to elevated BP.  Will resume midodrine 10 mg before HD on TTS schedule.  ESRD - off schedule, will plan on HD tomorrow to get back on TTS schedule.  Diarrhea - GI panel + for yersinia and norovirus.  Plan per primary Anemia of ESRD - stable Elevated troponin I - per primary. CKD-MBD - Ca and phos stable, continue with home meds. Chronic systolic CHF due to NICM - per primary DM type 2 - per primary Chronic atrial fibrillation/flutter - on coumadin.  INR 1.7 this am. Per primary.   Donetta Potts, MD [employer] [phone]

## 2021-07-01 NOTE — Progress Notes (Signed)
ANTICOAGULATION CONSULT NOTE -    Pharmacy Consult for warfarin dosing  Indication: atrial fibrillation   No Known Allergies    Patient Measurements: Last Weight  Most recent update: 07/01/2021  5:10 AM    Weight  115.6 kg (254 lb 13.6 oz)            Body mass index is 31.85 kg/m. [name]               Temp: 97.5 F (36.4 C) (01/31 0509) Temp Source: Axillary (01/31 0509) BP: 146/68 (01/31 0730) Pulse Rate: 86 (01/31 0400)  Labs: Recent Labs    06/29/21 0457 06/29/21 0458 06/30/21 0336 07/01/21 0433  HGB  --   --  13.2  --   HCT  --   --  42.6  --   PLT  --   --  131*  --   LABPROT 36.7*  --  25.9* 20.1*  INR 3.7*  --  2.4* 1.7*  CREATININE  --  9.40* 6.94*  --      Estimated Creatinine Clearance: 14 mL/min (A) (by C-G formula based on SCr of 6.94 mg/dL (H)).     Medications:  Medications Prior to Admission  Medication Sig Dispense Refill Last Dose   acetaminophen (TYLENOL) 500 MG tablet Take 500-1,000 mg by mouth daily as needed (back pain).   unknown   B Complex-C-Folic Acid (RENAL MULTIVITAMIN FORMULA) TABS Take 1 tablet by mouth daily. Renal Tab II   Past Week   Cholecalciferol (VITAMIN D3) 125 MCG (5000 UT) TABS Take 5,000 Units by mouth daily.   Past Week   cinacalcet (SENSIPAR) 30 MG tablet Take 30 mg by mouth every evening.   Past Week   diclofenac Sodium (VOLTAREN) 1 % GEL Apply 2 g topically 4 (four) times daily.   unknown   doxercalciferol (HECTOROL) 4 MCG/2ML injection Inject 2.5 mLs (5 mcg total) into the vein Every Tuesday,Thursday,and Saturday with dialysis. 2 mL  06/26/2021   enoxaparin (LOVENOX) 120 MG/0.8ML injection Inject 120 mg into the skin every 12 (twelve) hours.   Past Week at unknown   ferrous sulfate 325 (65 FE) MG tablet Take 325 mg by mouth daily with breakfast.   Past Week   insulin aspart (NOVOLOG) 100 UNIT/ML injection Inject 1-9 Units into the skin 3 (three) times daily before meals.   Past Week   insulin lispro (HUMALOG)  100 UNIT/ML KwikPen Inject 10 Units into the skin 2 (two) times daily.   Past Week   iron polysaccharides (NIFEREX) 150 MG capsule Take 150 mg by mouth daily.   Past Week   lidocaine-prilocaine (EMLA) cream Apply 1 application topically as needed (Apply small amount to access site 1-2 hours before dialysis. Cover with occlusive dressing (saran wrap)).    Past Week   midodrine (PROAMATINE) 5 MG tablet Take 5 mg by mouth 2 (two) times daily.   Past Week   multivitamin (RENA-VIT) TABS tablet Take 1 tablet by mouth daily.   Past Week   oxyCODONE-acetaminophen (PERCOCET) 5-325 MG tablet Take 1 tablet by mouth every 6 (six) hours as needed for severe pain. 8 tablet 0 unknown   pantoprazole (PROTONIX) 40 MG tablet Take 1 tablet (40 mg total) by mouth 2 (two) times daily. 60 tablet 1 Past Week   sevelamer carbonate (RENVELA) 800 MG tablet Take 800 mg by mouth 3 (three) times daily with meals.   Past Week   vitamin C (ASCORBIC ACID) 500 MG tablet Take 500  mg by mouth daily.   Past Week   warfarin (COUMADIN) 5 MG tablet Take 5 mg by mouth daily. Take 1 &1/2 (7.5mg ) tablets on Mon,Wed,Fri,Sat and Sunday and then on Tues and Thursday take 1(5mg) tablet   Past Week at unknown   zinc sulfate 220 (50 Zn) MG capsule Take 220 mg by mouth daily.   Past Week   Scheduled:   Chlorhexidine Gluconate Cloth  6 each Topical Daily   Chlorhexidine Gluconate Cloth  6 each Topical Q0600   cinacalcet  30 mg Oral Q supper   dextromethorphan-guaiFENesin  1 tablet Oral BID   doxycycline  100 mg Oral Q12H   ferrous sulfate  325 mg Oral Q breakfast   insulin aspart  0-5 Units Subcutaneous QHS   insulin aspart  0-6 Units Subcutaneous TID WC   multivitamin  1 tablet Oral Daily   pantoprazole  40 mg Oral BID   sevelamer carbonate  800 mg Oral TID WC   Warfarin - Pharmacist Dosing Inpatient   Does not apply q1600   Infusions:   sodium chloride Stopped (06/30/21 1249)   sodium chloride     sodium chloride     ceFEPime  (MAXIPIME) IV 1 g (06/30/21 2122)   norepinephrine (LEVOPHED) Adult infusion Stopped (06/30/21 0901)   PRN: sodium chloride, sodium chloride, acetaminophen **OR** acetaminophen, acetaminophen, lidocaine (PF), lidocaine-prilocaine, pentafluoroprop-tetrafluoroeth, prochlorperazine Anti-infectives (From admission, onward)    Start     Dose/Rate Route Frequency Ordered Stop   06/28/21 2200  ceFEPIme (MAXIPIME) 1 g in sodium chloride 0.9 % 100 mL IVPB        1 g 200 mL/hr over 30 Minutes Intravenous Every 24 hours 06/28/21 0457     06/28/21 1200  vancomycin (VANCOCIN) IVPB 1000 mg/200 mL premix  Status:  Discontinued        1,000 mg 200 mL/hr over 60 Minutes Intravenous Every T-Th-Sa (Hemodialysis) 06/28/21 0457 06/29/21 1036   06/28/21 1100  doxycycline (VIBRA-TABS) tablet 100 mg        100 mg Oral Every 12 hours 06/28/21 1014     01 /28/23 0230  vancomycin (VANCOREADY) IVPB 2000 mg/400 mL        2,000 mg 200 mL/hr over 120 Minutes Intravenous  Once 06/28/21 0225 06/28/21 0501   06/28/21 0230  ceFEPIme (MAXIPIME) 2 g in sodium chloride 0.9 % 100 mL IVPB        2 g 200 mL/hr over 30 Minutes Intravenous  Once 06/28/21 0225 06/28/21 0340       Goal of Therapy:  INR 2-3 Monitor platelets by anticoagulation protocol: Yes    Prior to Admission Warfarin Dosing:  [name] takes 7.5mg  of warfarin daily       Admit INR was 3.7 Lab Results  Component Value Date   INR 1.7 (H) 07/01/2021   INR 2.4 (H) 06/30/2021   INR 3.7 (H) 06/29/2021    Assessment: ORESTES GEIMAN a 71 y.o. male requires anticoagulation with warfarin for the indication of  atrial fibrillation. Warfarin will be initiated inpatient following pharmacy protocol per pharmacy consult. Patient most recent blood work is as follows: CBC Latest Ref Rng & Units 06/30/2021 06/28/2021 06/27/2021  WBC 4.0 - 10.5 K/uL 4.4 4.9 6.8  Hemoglobin 13.0 - 17.0 g/dL 13.2 14.0 13.8  Hematocrit 39.0 - 52.0 % 42.6 43.2 43.9  Platelets 150 -  400 K/uL 131(L) 126(L) 122(L)   INR 1.7 subtherapeutic   Plan: Warfarin 7.5mg  po x 1 today Monitor CBC  MWF with am labs   Monitor INR daily Monitor for signs and symptoms of bleeding   Thomasenia Sales, PharmD, [facility] Clinical Pharmacist

## 2021-07-01 NOTE — Discharge Summary (Addendum)
Physician Discharge Summary  DELSHAWN STECH ZJQ:[id number] DOB: Oct 28, 1950 DOA: 06/27/2021  PCP: Burnard Bunting, MD  Admit date: 06/27/2021 Discharge date: 07/01/2021  Admitted From: Home Disposition:  Home   Recommendations for Outpatient Follow-up:  Follow up with PCP in 1-2 weeks Please obtain BMP/CBC in one week    Discharge Condition: Stable CODE STATUS: FULL Diet recommendation: Heart Healthy  Brief/Interim Summary: 71 year old male with a history of ESRD (TTS), chronic atrial fibrillation/atrial flutter, DVT right leg, diabetes mellitus type 2, hypertension, hyperlipidemia, NHL in remission, COPD, systolic CHF presenting with 2-day history of shortness of breath, generalized abdominal pain, and diarrhea that began on the evening of 06/26/2020 after dialysis.  The patient states that he had been in usual state of health until after his dialysis on 06/26/2020.  He states that he has been compliant with dialysis testing the whole time.  He states that his only new medication is midodrine which he started 2 weeks ago.  He states that he only takes it on dialysis days.  He denies any recent antibiotics.  He denies any fevers, chills, headache, neck pain, coughing, hemoptysis, chest pain.  He denies any worsening peripheral edema.  He states that his abdominal pain was associated with liquid stools of which he had 5-6 on 06/27/2021.  There is no hematochezia, melena.  He states that his pain was constant with intermittent cramping.  He denies any recent antibiotics.  There is been no sick contacts or unusual or undercooked foods.  He had some nausea without any emesis.  He endorses compliance with all his medications. In the ED, the patient was afebrile and hypotensive with systolic blood pressure in the 70s.  He was given 3 L of fluid but remained hypotensive.  He was started on Levophed.  Oxygen saturation was 92% room air.  He was placed on 2 L supplemental with saturation up to 99%.  He was  initially tachycardic 110-120s.  Patient was started on vancomycin and cefepime.  However the second blood culture was obtained after the patient has been started on antibiotics.  Chest x-ray showed L>R bibasilar opacities.  CT chest, abdomen, pelvis was negative for any acute findings.  Notably, there was no splenomegaly or pancreatic or bowel inflammatory changes or bili ductal dilatation.  There was an IVC filter.  The patient was admitted for further treatment and evaluation of his sepsis.  COVID-19 PCR and influenza PCR negative.  He was initially started on vanc/cefepime/doxy and levophed.  Vanc was d/ced after MRSA PCR was neg.  His diarrhea improved as well as his abd pain.  Ultimately his stool pathogen panel came back showing Yersinia and norovirus.  He was weaned off of levophed and remained stable.  Renal was consulted for his HD needs. He will go home with cefdinir x 4 days and doxy x 10 more days    Discharge Diagnoses:  Septic shock -Patient presented with tachycardia, hypotension refractory to fluid resuscitation -due to pneumonia and enteritis -Lactic acid 4.8>>greater than 9 -Continue Levophed>> wean for SBP greater than 90 -Levophed 6 mcg>>2 mcg>>off am 1/30 -At baseline, the patient takes midodrine for soft blood pressures on HD days -disContinue midodrine TID>>mido -Follow blood cultures--neg -PCT 5.53 -am cortisol-13.1 -BP cuff accuracy questionable>>BP improved with BP on left arm -weaned off levophed>>remained off levophed x 24 hours   Lobar pneumonia -Continue empiric cefepime -continue empiric doxycycline -1/27 CXR personally reviewed--patchy densities bilateral LL  -MRSA screen--neg -d/c vanc -d/c home with 4 more days cefdinir  Lactic acidosis -Check VBG--7.323/55/<31/24 -Secondary to sepsis and volume depletion -No signs of ischemic colitis on CT abdomen   Yersinia and Norovirus Enteritis -Stool pathogen panel--positive for Yersinia and norovirus -pt  already on doxy>>plan 10 more days after d/c   ESRD -He is TTS, -last HD on 06/26/21 -Nephrology consult appreciated -Had HD 1/29 -HD on 1/31 before d/c -Continue Renvela and Sensipar   Diabetes mellitus type 2 -NovoLog sliding scale -1/28 Hemoglobin A1c--8.1   Chronic atrial fibrillation/a flutter -Continue warfarin -Currently rate controlled   GERD -Continue pantoprazole   Supratherapeutic INR -Daily INR -Pharmacy to assist with Coumadin dosing -Monitor for active signs of bleeding>>none   Chronic systolic CHF due to NICM -Echo 04/2019 with normalization of his EF.  -Echo 02/07/[facility]21 at [facility] showed EF remains >55% -GDMT limited somewhat from ESRD   Elevated lipase -no pancreatic inflammation on CT abd -likely due to ESRD -tolerating diet   Elevated troponin -no chest pain -EKG unchanged from prior with RBBB -due to ESRD and demand ischemia -1/29 Echo--EF 60-65%, no WMA; RV overload     Discharge Instructions   Allergies as of 07/01/2021   No Known Allergies      Medication List     STOP taking these medications    enoxaparin 120 MG/0.8ML injection Commonly known as: LOVENOX   oxyCODONE-acetaminophen 5-325 MG tablet Commonly known as: Percocet       TAKE these medications    acetaminophen 500 MG tablet Commonly known as: TYLENOL Take 500-1,000 mg by mouth daily as needed (back pain).   cefdinir 300 MG capsule Commonly known as: OMNICEF Take 1 capsule (300 mg total) by mouth daily. Start taking on: July 02, 2021   cinacalcet 30 MG tablet Commonly known as: SENSIPAR Take 30 mg by mouth every evening.   diclofenac Sodium 1 % Gel Commonly known as: VOLTAREN Apply 2 g topically 4 (four) times daily.   doxercalciferol 4 MCG/2ML injection Commonly known as: HECTOROL Inject 2.5 mLs (5 mcg total) into the vein Every Tuesday,Thursday,and Saturday with dialysis.   doxycycline 100 MG tablet Commonly known as: VIBRA-TABS Take 1 tablet (100  mg total) by mouth every 12 (twelve) hours.   ferrous sulfate 325 (65 FE) MG tablet Take 325 mg by mouth daily with breakfast.   insulin aspart 100 UNIT/ML injection Commonly known as: novoLOG Inject 1-9 Units into the skin 3 (three) times daily before meals.   insulin lispro 100 UNIT/ML KwikPen Commonly known as: HUMALOG Inject 10 Units into the skin 2 (two) times daily.   iron polysaccharides 150 MG capsule Commonly known as: NIFEREX Take 150 mg by mouth daily.   lidocaine-prilocaine cream Commonly known as: EMLA Apply 1 application topically as needed (Apply small amount to access site 1-2 hours before dialysis. Cover with occlusive dressing (saran wrap)).   midodrine 10 MG tablet Commonly known as: PROAMATINE Take 1 tablet (10 mg total) by mouth Every Tuesday,Thursday,and Saturday with dialysis. What changed:  medication strength how much to take when to take this   Renal Multivitamin Formula Tabs Take 1 tablet by mouth daily. Renal Tab II   multivitamin Tabs tablet Take 1 tablet by mouth daily.   pantoprazole 40 MG tablet Commonly known as: Protonix Take 1 tablet (40 mg total) by mouth 2 (two) times daily.   sevelamer carbonate 800 MG tablet Commonly known as: RENVELA Take 800 mg by mouth 3 (three) times daily with meals.   vitamin C 500 MG tablet Commonly known as: ASCORBIC ACID  Take 500 mg by mouth daily.   Vitamin D3 125 MCG (5000 UT) Tabs Take 5,000 Units by mouth daily.   warfarin 5 MG tablet Commonly known as: COUMADIN Take 5 mg by mouth daily. Take 1 &1/2 (7.5mg ) tablets on Mon,Wed,Fri,Sat and Sunday and then on Tues and Thursday take 1(5mg) tablet   zinc sulfate 220 (50 Zn) MG capsule Take 220 mg by mouth daily.        No Known Allergies  Consultations: renal   Procedures/Studies: DG Chest 1 View  Result Date: 06/27/2021 CLINICAL DATA:  Shortness of breath, vomiting EXAM: CHEST  1 VIEW COMPARISON:  07/16/2018 FINDINGS: There is poor  inspiration. There are patchy linear densities in both lower lung fields, more so on the left side. Costophrenic angles are clear. There is no pneumothorax. There is mild extrinsic pressure over the left lateral margin of trachea in the lower neck suggesting possible enlarged thyroid. IMPRESSION: There linear patchy densities in both lower lung fields, more so on the left side. Findings suggest possible atelectasis/pneumonia. There is poor inspiration which could account for some of the crowding of markings in the lower lung fields. There is no significant pleural effusion or pneumothorax. Electronically Signed   By: Palani  Rathinasamy [name]   On: 06/27/2021 20:38   CT CHEST ABDOMEN PELVIS W CONTRAST  Result Date: 06/28/2021 CLINICAL DATA:  Abdominal pain and dyspnea EXAM: CT CHEST, ABDOMEN, AND PELVIS WITH CONTRAST TECHNIQUE: Multidetector CT imaging of the chest, abdomen and pelvis was performed following the standard protocol during bolus administration of intravenous contrast. RADIATION DOSE REDUCTION: This exam was performed according to the departmental dose-optimization program which includes automated exposure control, adjustment of the mA and/or kV according to patient size and/or use of iterative reconstruction technique. CONTRAST:  100mL OMNIPAQUE IOHEXOL 350 MG/ML SOLN COMPARISON:  None. FINDINGS: CT CHEST FINDINGS Cardiovascular: Calcific aortic atherosclerosis and coronary artery calcification. No pericardial effusion. Mediastinum/Nodes: No enlarged mediastinal, hilar, or axillary lymph nodes. Thyroid gland, trachea, and esophagus demonstrate no significant findings. Lungs/Pleura: Bibasilar atelectasis. No pleural effusion or pneumothorax. Musculoskeletal: No chest wall mass or suspicious bone lesions identified. CT ABDOMEN PELVIS FINDINGS Hepatobiliary: No focal liver abnormality is seen. Status post cholecystectomy. No biliary dilatation. Pancreas: Unremarkable. No pancreatic ductal dilatation or  surrounding inflammatory changes. Spleen: Normal in size without focal abnormality. Adrenals/Urinary Tract: Adrenal glands are unremarkable. Kidneys are normal, without renal calculi, focal lesion, or hydronephrosis. Bladder is unremarkable. Stomach/Bowel: Stomach is within normal limits. Appendix appears normal. No evidence of bowel wall thickening, distention, or inflammatory changes. Vascular/Lymphatic: Infrarenal IVC filter. Calcific aortic atherosclerosis. No lymphadenopathy. Reproductive: Prostate is unremarkable. Other: Small fat containing ventral abdominal hernia. Musculoskeletal: No acute or significant osseous findings. IMPRESSION: 1. No acute abnormality of the chest, abdomen or pelvis. Aortic Atherosclerosis (ICD10-I70.0). Electronically Signed   By: Kevin  Herman [name]   On: 06/28/2021 01:54   ECHOCARDIOGRAM COMPLETE  Result Date: 06/28/2021    ECHOCARDIOGRAM REPORT   Patient Name:   Nickolai W Bleau Date of Exam: 06/28/2021 Medical Rec #:  [id number]     Height:       75.0 in Accession #:    [id number]    Weight:       251.5 lb Date of Birth:  03/17/1951    BSA:          2.419 m Patient Age:    70 years      BP:           13 6/45 mmHg  Patient Gender: M             HR:           67 bpm. Exam Location:  [facility] Procedure: 2D Echo, Cardiac Doppler and Color Doppler Indications:    CHF-Acute Diastolic R91.63  History:        Patient has prior history of Echocardiogram examinations, most                 recent 04/07/2019. COPD, Arrythmias:Atrial Fibrillation and                 Atrial Flutter; Risk Factors:Hypertension, Diabetes,                 Dyslipidemia and Sleep Apnea.  Sonographer:    Bernadene Person RDCS Referring Phys: [id number] OLADAPO ADEFESO IMPRESSIONS  1. Left ventricular ejection fraction, by estimation, is 60 to 65%. The left ventricle has normal function. The left ventricle has no regional wall motion abnormalities. There is moderate asymmetric left ventricular hypertrophy of the septal  segment. Left ventricular diastolic parameters are indeterminate.  2. Ventricular septum is flattened in systole suggesting RV pressure overload. . Right ventricular systolic function was not well visualized. The right ventricular size is not well visualized. There is moderately elevated pulmonary artery systolic pressure.  3. The mitral valve is normal in structure. No evidence of mitral valve regurgitation. No evidence of mitral stenosis.  4. The aortic valve is tricuspid. There is moderate calcification of the aortic valve. There is moderate thickening of the aortic valve. Aortic valve regurgitation is not visualized. No aortic stenosis is present.  5. Aortic dilatation noted. There is mild dilatation of the ascending aorta, measuring 40 mm.  6. The inferior vena cava is dilated in size with >50% respiratory variability, suggesting right atrial pressure of 8 mmHg. FINDINGS  Left Ventricle: Left ventricular ejection fraction, by estimation, is 60 to 65%. The left ventricle has normal function. The left ventricle has no regional wall motion abnormalities. The left ventricular internal cavity size was normal in size. There is  moderate asymmetric left ventricular hypertrophy of the septal segment. Left ventricular diastolic parameters are indeterminate. Right Ventricle: Ventricular septum is flattened in systole suggesting RV pressure overload. The right ventricular size is not well visualized. Right vetricular wall thickness was not well visualized. Right ventricular systolic function was not well visualized. There is moderately elevated pulmonary artery systolic pressure. The tricuspid regurgitant velocity is 3.20 m/s, and with an assumed right atrial pressure of 8 mmHg, the estimated right ventricular systolic pressure is 35.7 mmHg. Left Atrium: Left atrial size was normal in size. Right Atrium: Right atrial size was normal in size. Pericardium: There is no evidence of pericardial effusion. Mitral Valve: The  mitral valve is normal in structure. There is mild thickening of the mitral valve leaflet(s). There is mild calcification of the mitral valve leaflet(s). Mild mitral annular calcification. No evidence of mitral valve regurgitation. No evidence of mitral valve stenosis. Tricuspid Valve: The tricuspid valve is not well visualized. Tricuspid valve regurgitation is mild . No evidence of tricuspid stenosis. Aortic Valve: The aortic valve is tricuspid. There is moderate calcification of the aortic valve. There is moderate thickening of the aortic valve. There is mild aortic valve annular calcification. Aortic valve regurgitation is not visualized. No aortic stenosis is present. Aortic valve mean gradient measures 4.4 mmHg. Aortic valve peak gradient measures 9.0 mmHg. Aortic valve area, by VTI measures 2.80 cm. Pulmonic Valve: The pulmonic valve  was not well visualized. Pulmonic valve regurgitation is not visualized. No evidence of pulmonic stenosis. Aorta: Aortic dilatation noted and the aortic root is normal in size and structure. There is mild dilatation of the ascending aorta, measuring 40 mm. Venous: The inferior vena cava is dilated in size with greater than 50% respiratory variability, suggesting right atrial pressure of 8 mmHg. IAS/Shunts: No atrial level shunt detected by color flow Doppler.  LEFT VENTRICLE PLAX 2D LVIDd:         4.40 cm LVIDs:         2.90 cm LV PW:         1.10 cm LV IVS:        1.40 cm LVOT diam:     2.30 cm LV SV:         76 LV SV Index:   31 LVOT Area:     4.15 cm  LV Volumes (MOD) LV vol d, MOD A2C: 96.2 ml LV vol d, MOD A4C: 96.1 ml LV vol s, MOD A2C: 39.7 ml LV vol s, MOD A4C: 42.6 ml LV SV MOD A2C:     56.5 ml LV SV MOD A4C:     96.1 ml LV SV MOD BP:      53.0 ml RIGHT VENTRICLE RV S prime:     6.89 cm/s TAPSE (M-mode): 2.2 cm LEFT ATRIUM             Index        RIGHT ATRIUM           Index LA diam:        4.00 cm 1.65 cm/m   RA Area:     18.90 cm LA Vol (A2C):   63.1 ml 26.09 ml/m   RA Volume:   49.70 ml  20.55 ml/m LA Vol (A4C):   58.1 ml 24.02 ml/m LA Biplane Vol: 64.9 ml 26.83 ml/m  AORTIC VALVE AV Area (Vmax):    2.69 cm AV Area (Vmean):   2.62 cm AV Area (VTI):     2.80 cm AV Vmax:           149.62 cm/s AV Vmean:          98.265 cm/s AV VTI:            0.270 m AV Peak Grad:      9.0 mmHg AV Mean Grad:      4.4 mmHg LVOT Vmax:         96.83 cm/s LVOT Vmean:        61.867 cm/s LVOT VTI:          0.182 m LVOT/AV VTI ratio: 0.67  AORTA Ao Root diam: 3.90 cm Ao Asc diam:  4.00 cm TRICUSPID VALVE TR Peak grad:   41.0 mmHg TR Vmax:        320.00 cm/s  SHUNTS Systemic VTI:  0.18 m Systemic Diam: 2.30 cm Carlyle Dolly MD Electronically signed by Carlyle Dolly MD Signature Date/Time: 06/28/2021/3:34:40 PM    Final         Discharge Exam: Vitals:   07/01/21 1600 07/01/21 1630  BP: 114/61 (!) 105/57  Pulse: 91 94  Resp: 19 20  Temp:    SpO2:     Vitals:   07/01/21 1530 07/01/21 1545 07/01/21 1600 07/01/21 1630  BP: (!) 106/55 92/64 114/61 (!) 105/57  Pulse: 91 60 91 94  Resp: 18 18 19 20   Temp:      TempSrc:  SpO2:      Weight:      Height:        General: Pt is alert, awake, not in acute distress Cardiovascular: RRR, S1/S2 +, no rubs, no gallops Respiratory: bibasilar rales. No wheeze Abdominal: Soft, NT, ND, bowel sounds + Extremities: no edema, no cyanosis   The results of significant diagnostics from this hospitalization (including imaging, microbiology, ancillary and laboratory) are listed below for reference.    Significant Diagnostic Studies: DG Chest 1 View  Result Date: 06/27/2021 CLINICAL DATA:  Shortness of breath, vomiting EXAM: CHEST  1 VIEW COMPARISON:  07/16/2018 FINDINGS: There is poor inspiration. There are patchy linear densities in both lower lung fields, more so on the left side. Costophrenic angles are clear. There is no pneumothorax. There is mild extrinsic pressure over the left lateral margin of trachea in the lower neck  suggesting possible enlarged thyroid. IMPRESSION: There linear patchy densities in both lower lung fields, more so on the left side. Findings suggest possible atelectasis/pneumonia. There is poor inspiration which could account for some of the crowding of markings in the lower lung fields. There is no significant pleural effusion or pneumothorax. Electronically Signed   By: Elmer Picker [name]   On: 06/27/2021 20:38   CT CHEST ABDOMEN PELVIS W CONTRAST  Result Date: 06/28/2021 CLINICAL DATA:  Abdominal pain and dyspnea EXAM: CT CHEST, ABDOMEN, AND PELVIS WITH CONTRAST TECHNIQUE: Multidetector CT imaging of the chest, abdomen and pelvis was performed following the standard protocol during bolus administration of intravenous contrast. RADIATION DOSE REDUCTION: This exam was performed according to the departmental dose-optimization program which includes automated exposure control, adjustment of the mA and/or kV according to patient size and/or use of iterative reconstruction technique. CONTRAST:  131mL OMNIPAQUE IOHEXOL 350 MG/ML SOLN COMPARISON:  None. FINDINGS: CT CHEST FINDINGS Cardiovascular: Calcific aortic atherosclerosis and coronary artery calcification. No pericardial effusion. Mediastinum/Nodes: No enlarged mediastinal, hilar, or axillary lymph nodes. Thyroid gland, trachea, and esophagus demonstrate no significant findings. Lungs/Pleura: Bibasilar atelectasis. No pleural effusion or pneumothorax. Musculoskeletal: No chest wall mass or suspicious bone lesions identified. CT ABDOMEN PELVIS FINDINGS Hepatobiliary: No focal liver abnormality is seen. Status post cholecystectomy. No biliary dilatation. Pancreas: Unremarkable. No pancreatic ductal dilatation or surrounding inflammatory changes. Spleen: Normal in size without focal abnormality. Adrenals/Urinary Tract: Adrenal glands are unremarkable. Kidneys are normal, without renal calculi, focal lesion, or hydronephrosis. Bladder is unremarkable.  Stomach/Bowel: Stomach is within normal limits. Appendix appears normal. No evidence of bowel wall thickening, distention, or inflammatory changes. Vascular/Lymphatic: Infrarenal IVC filter. Calcific aortic atherosclerosis. No lymphadenopathy. Reproductive: Prostate is unremarkable. Other: Small fat containing ventral abdominal hernia. Musculoskeletal: No acute or significant osseous findings. IMPRESSION: 1. No acute abnormality of the chest, abdomen or pelvis. Aortic Atherosclerosis (ICD10-I70.0). Electronically Signed   By: Ulyses Jarred [name]   On: 06/28/2021 01:54   ECHOCARDIOGRAM COMPLETE  Result Date: 06/28/2021    ECHOCARDIOGRAM REPORT   Patient Name:   ELDOR CONAWAY Date of Exam: 06/28/2021 Medical Rec #:  [id number]     Height:       75.0 in Accession #:    [id number]    Weight:       251.5 lb Date of Birth:  07-25-1950    BSA:          2.419 m Patient Age:    49 years      BP:           136/45 mmHg Patient Gender: M  HR:           67 bpm. Exam Location:  [facility] Procedure: 2D Echo, Cardiac Doppler and Color Doppler Indications:    CHF-Acute Diastolic I95.18  History:        Patient has prior history of Echocardiogram examinations, most                 recent 04/07/2019. COPD, Arrythmias:Atrial Fibrillation and                 Atrial Flutter; Risk Factors:Hypertension, Diabetes,                 Dyslipidemia and Sleep Apnea.  Sonographer:    Bernadene Person RDCS Referring Phys: [id number] OLADAPO ADEFESO IMPRESSIONS  1. Left ventricular ejection fraction, by estimation, is 60 to 65%. The left ventricle has normal function. The left ventricle has no regional wall motion abnormalities. There is moderate asymmetric left ventricular hypertrophy of the septal segment. Left ventricular diastolic parameters are indeterminate.  2. Ventricular septum is flattened in systole suggesting RV pressure overload. . Right ventricular systolic function was not well visualized. The right ventricular size is not well  visualized. There is moderately elevated pulmonary artery systolic pressure.  3. The mitral valve is normal in structure. No evidence of mitral valve regurgitation. No evidence of mitral stenosis.  4. The aortic valve is tricuspid. There is moderate calcification of the aortic valve. There is moderate thickening of the aortic valve. Aortic valve regurgitation is not visualized. No aortic stenosis is present.  5. Aortic dilatation noted. There is mild dilatation of the ascending aorta, measuring 40 mm.  6. The inferior vena cava is dilated in size with >50% respiratory variability, suggesting right atrial pressure of 8 mmHg. FINDINGS  Left Ventricle: Left ventricular ejection fraction, by estimation, is 60 to 65%. The left ventricle has normal function. The left ventricle has no regional wall motion abnormalities. The left ventricular internal cavity size was normal in size. There is  moderate asymmetric left ventricular hypertrophy of the septal segment. Left ventricular diastolic parameters are indeterminate. Right Ventricle: Ventricular septum is flattened in systole suggesting RV pressure overload. The right ventricular size is not well visualized. Right vetricular wall thickness was not well visualized. Right ventricular systolic function was not well visualized. There is moderately elevated pulmonary artery systolic pressure. The tricuspid regurgitant velocity is 3.20 m/s, and with an assumed right atrial pressure of 8 mmHg, the estimated right ventricular systolic pressure is 30.1 mmHg. Left Atrium: Left atrial size was normal in size. Right Atrium: Right atrial size was normal in size. Pericardium: There is no evidence of pericardial effusion. Mitral Valve: The mitral valve is normal in structure. There is mild thickening of the mitral valve leaflet(s). There is mild calcification of the mitral valve leaflet(s). Mild mitral annular calcification. No evidence of mitral valve regurgitation. No evidence of  mitral valve stenosis. Tricuspid Valve: The tricuspid valve is not well visualized. Tricuspid valve regurgitation is mild . No evidence of tricuspid stenosis. Aortic Valve: The aortic valve is tricuspid. There is moderate calcification of the aortic valve. There is moderate thickening of the aortic valve. There is mild aortic valve annular calcification. Aortic valve regurgitation is not visualized. No aortic stenosis is present. Aortic valve mean gradient measures 4.4 mmHg. Aortic valve peak gradient measures 9.0 mmHg. Aortic valve area, by VTI measures 2.80 cm. Pulmonic Valve: The pulmonic valve was not well visualized. Pulmonic valve regurgitation is not visualized. No evidence of pulmonic stenosis.  Aorta: Aortic dilatation noted and the aortic root is normal in size and structure. There is mild dilatation of the ascending aorta, measuring 40 mm. Venous: The inferior vena cava is dilated in size with greater than 50% respiratory variability, suggesting right atrial pressure of 8 mmHg. IAS/Shunts: No atrial level shunt detected by color flow Doppler.  LEFT VENTRICLE PLAX 2D LVIDd:         4.40 cm LVIDs:         2.90 cm LV PW:         1.10 cm LV IVS:        1.40 cm LVOT diam:     2.30 cm LV SV:         76 LV SV Index:   31 LVOT Area:     4.15 cm  LV Volumes (MOD) LV vol d, MOD A2C: 96.2 ml LV vol d, MOD A4C: 96.1 ml LV vol s, MOD A2C: 39.7 ml LV vol s, MOD A4C: 42.6 ml LV SV MOD A2C:     56.5 ml LV SV MOD A4C:     96.1 ml LV SV MOD BP:      53.0 ml RIGHT VENTRICLE RV S prime:     6.89 cm/s TAPSE (M-mode): 2.2 cm LEFT ATRIUM             Index        RIGHT ATRIUM           Index LA diam:        4.00 cm 1.65 cm/m   RA Area:     18.90 cm LA Vol (A2C):   63.1 ml 26.09 ml/m  RA Volume:   49.70 ml  20.55 ml/m LA Vol (A4C):   58.1 ml 24.02 ml/m LA Biplane Vol: 64.9 ml 26.83 ml/m  AORTIC VALVE AV Area (Vmax):    2.69 cm AV Area (Vmean):   2.62 cm AV Area (VTI):     2.80 cm AV Vmax:           149.62 cm/s AV Vmean:           98.265 cm/s AV VTI:            0.270 m AV Peak Grad:      9.0 mmHg AV Mean Grad:      4.4 mmHg LVOT Vmax:         96.83 cm/s LVOT Vmean:        61.867 cm/s LVOT VTI:          0.182 m LVOT/AV VTI ratio: 0.67  AORTA Ao Root diam: 3.90 cm Ao Asc diam:  4.00 cm TRICUSPID VALVE TR Peak grad:   41.0 mmHg TR Vmax:        320.00 cm/s  SHUNTS Systemic VTI:  0.18 m Systemic Diam: 2.30 cm Carlyle Dolly MD Electronically signed by Carlyle Dolly MD Signature Date/Time: 06/28/2021/3:34:40 PM    Final     Microbiology: Recent Results (from the past 240 hour(s))  Resp Panel by RT-PCR (Flu A&B, Covid) Nasopharyngeal Swab     Status: None   Collection Time: 06/27/21  8:42 PM   Specimen: Nasopharyngeal Swab; Nasopharyngeal(NP) swabs in vial transport medium  Result Value Ref Range Status   SARS Coronavirus 2 by RT PCR NEGATIVE NEGATIVE Final    Comment: (NOTE) SARS-CoV-2 target nucleic acids are NOT DETECTED.  The SARS-CoV-2 RNA is generally detectable in upper respiratory specimens during the acute phase of infection. The lowest concentration of SARS-CoV-2  viral copies this assay can detect is 138 copies/mL. A negative result does not preclude SARS-Cov-2 infection and should not be used as the sole basis for treatment or other patient management decisions. A negative result may occur with  improper specimen collection/handling, submission of specimen other than nasopharyngeal swab, presence of viral mutation(s) within the areas targeted by this assay, and inadequate number of viral copies(<138 copies/mL). A negative result must be combined with clinical observations, patient history, and epidemiological information. The expected result is Negative.  Fact Sheet for Patients:  EntrepreneurPulse.com.au  Fact Sheet for Healthcare Providers:  IncredibleEmployment.be  This test is no t yet approved or cleared by the [place] FDA and  has been authorized for  detection and/or diagnosis of SARS-CoV-2 by FDA under an Emergency Use Authorization (EUA). This EUA will remain  in effect (meaning this test can be used) for the duration of the COVID-19 declaration under Section 564(b)(1) of the Act, 21 U.S.C.section 360bbb-3(b)(1), unless the authorization is terminated  or revoked sooner.       Influenza A by PCR NEGATIVE NEGATIVE Final   Influenza B by PCR NEGATIVE NEGATIVE Final    Comment: (NOTE) The Xpert Xpress SARS-CoV-2/FLU/RSV plus assay is intended as an aid in the diagnosis of influenza from Nasopharyngeal swab specimens and should not be used as a sole basis for treatment. Nasal washings and aspirates are unacceptable for Xpert Xpress SARS-CoV-2/FLU/RSV testing.  Fact Sheet for Patients: EntrepreneurPulse.com.au  Fact Sheet for Healthcare Providers: IncredibleEmployment.be  This test is not yet approved or cleared by the [place] FDA and has been authorized for detection and/or diagnosis of SARS-CoV-2 by FDA under an Emergency Use Authorization (EUA). This EUA will remain in effect (meaning this test can be used) for the duration of the COVID-19 declaration under Section 564(b)(1) of the Act, 21 U.S.C. section 360bbb-3(b)(1), unless the authorization is terminated or revoked.  Performed at [facility], [address], [place], [place] [address]   Culture, blood (Routine X 2) w Reflex to ID Panel     Status: None (Preliminary result)   Collection Time: 06/28/21  1:04 AM   Specimen: BLOOD LEFT HAND  Result Value Ref Range Status   Specimen Description BLOOD LEFT HAND  Final   Special Requests   Final    BOTTLES DRAWN AEROBIC ONLY Blood Culture adequate volume   Culture   Final    NO GROWTH 3 DAYS Performed at [facility], [address], [place], [place] [address]    Report Status PENDING  Incomplete  Culture, blood (Routine X 2) w Reflex to ID Panel     Status: None (Preliminary  result)   Collection Time: 06/28/21  3:15 AM   Specimen: BLOOD LEFT ARM  Result Value Ref Range Status   Specimen Description BLOOD LEFT ARM  Final   Special Requests   Final    BOTTLES DRAWN AEROBIC AND ANAEROBIC Blood Culture adequate volume   Culture   Final    NO GROWTH 3 DAYS Performed at [facility], [address] [place], [place] [address]    Report Status PENDING  Incomplete  MRSA Next Gen by PCR, Nasal     Status: None   Collection Time: 06/28/21  8:33 AM   Specimen: Nasal Mucosa; Nasal Swab  Result Value Ref Range Status   MRSA by PCR Next Gen NOT DETECTED NOT DETECTED Final    Comment: (NOTE) The GeneXpert MRSA Assay (FDA approved for NASAL specimens only), is one component  of a comprehensive MRSA colonization surveillance program. It is not intended to diagnose MRSA infection nor to guide or monitor treatment for MRSA infections. Test performance is not FDA approved in patients less than 79 years old. Performed at [facility], [address], [place], [place] [address]   Gastrointestinal Panel by PCR , Stool     Status: Abnormal   Collection Time: 06/30/21  1:51 AM   Specimen: Stool  Result Value Ref Range Status   Campylobacter species NOT DETECTED NOT DETECTED Final   Plesimonas shigelloides NOT DETECTED NOT DETECTED Final   Salmonella species NOT DETECTED NOT DETECTED Final   Yersinia enterocolitica DETECTED (A) NOT DETECTED Final    Comment: RESULT CALLED TO, READ BACK BY AND VERIFIED WITH: JENNIFER KING ON 06/30/21 AT 1502 QSD    Vibrio species NOT DETECTED NOT DETECTED Final   Vibrio cholerae NOT DETECTED NOT DETECTED Final   Enteroaggregative E coli (EAEC) NOT DETECTED NOT DETECTED Final   Enteropathogenic E coli (EPEC) NOT DETECTED NOT DETECTED Final   Enterotoxigenic E coli (ETEC) NOT DETECTED NOT DETECTED Final   Shiga like toxin producing E coli (STEC) NOT DETECTED NOT DETECTED Final   Shigella/Enteroinvasive E coli (EIEC) NOT DETECTED NOT DETECTED  Final   Cryptosporidium NOT DETECTED NOT DETECTED Final   Cyclospora cayetanensis NOT DETECTED NOT DETECTED Final   Entamoeba histolytica NOT DETECTED NOT DETECTED Final   Giardia lamblia NOT DETECTED NOT DETECTED Final   Adenovirus F40/41 NOT DETECTED NOT DETECTED Final   Astrovirus NOT DETECTED NOT DETECTED Final   Norovirus GI/GII DETECTED (A) NOT DETECTED Final    Comment: RESULT CALLED TO, READ BACK BY AND VERIFIED WITH: JENNIFER KING ON 06/30/21 AT 1502 QSD    Rotavirus A NOT DETECTED NOT DETECTED Final   Sapovirus (I, II, IV, and V) NOT DETECTED NOT DETECTED Final    Comment: Performed at [facility], [address] [place], [place] [address]     Labs: Basic Metabolic Panel: Recent Labs  Lab 06/27/21 2200 06/28/21 0734 06/28/21 0736 06/29/21 0458 06/30/21 0336 07/01/21 1424  NA 138 134*  --  135 134* 131*  K 3.9 4.1  --  4.3 3.9 3.8  CL 97* 95*  --  95* 95* 95*  CO2 22 25  --  25 26 21*  GLUCOSE 234* 164*  --  92 107* 155*  BUN 46* 50*  --  60* 34* 45*  CREATININE 7.28* 7.60*  --  9.40* 6.94* 9.19*  CALCIUM 8.3* 8.3*  --  8.4* 8.5* 8.5*  MG  --   --  2.3  --   --   --   PHOS  --   --  2.7 3.9  --  3.4   Liver Function Tests: Recent Labs  Lab 06/27/21 2200 06/28/21 0734 06/29/21 0458 06/30/21 0336 07/01/21 1424  AST 43* 43*  --  40  --   ALT 41 44  --  34  --   ALKPHOS 115 124  --  123  --   BILITOT 0.7 0.6  --  0.6  --   PROT 5.5* 6.8  --  7.1  --   ALBUMIN 2.8* 3.2* 2.7* 3.2* 2.7*   Recent Labs  Lab 06/27/21 2200  LIPASE 79*   No results for input(s): AMMONIA in the last 168 hours. CBC: Recent Labs  Lab 06/27/21 2200 06/28/21 0734 06/30/21 0336 07/01/21 1424  WBC 6.8 4.9 4.4 3.5*  HGB 13.8 14.0 13.2 11.8*  HCT 43.9 43.2 42.6 34.7*  MCV 103.5* 104.1* 104.2* 99.1  PLT 122* 126* 131* 134*   Cardiac Enzymes: No results for input(s): CKTOTAL, CKMB, CKMBINDEX, TROPONINI in the last 168 hours. BNP: Invalid input(s):  POCBNP CBG: Recent Labs  Lab 06/30/21 1701 06/30/21 2147 07/01/21 0733 07/01/21 1148 07/01/21 1634  GLUCAP 96 114* 100* 129* 111*    Time coordinating discharge:  36 minutes  Signed:  Orson Eva, DO [facility] Pager: [phone] 07/01/2021, 6:04 PM

## 2021-07-01 NOTE — TOC Transition Note (Signed)
Transition of Care [facility]) - CM/SW Discharge Note   Patient Details  Name: [name] MRN: [id number] Date of Birth: 1951-04-29  Transition of Care [facility]) CM/SW Contact:  Shade Flood, LCSW Phone Number: 07/01/2021, 1:54 PM   Clinical Narrative:     Pt stable for dc today per MD. PT not recommending any PT follow up and no HH orders are entered. Pt will return home with self care. No TOC needs identified.  Final next level of care: Home/Self Care Barriers to Discharge: Barriers Resolved   Patient Goals and CMS Choice Patient states their goals for this hospitalization and ongoing recovery are:: Home with [facility] CMS Medicare.gov Compare Post Acute Care list provided to:: Patient Choice offered to / list presented to : Adult Children  Discharge Placement                       Discharge Plan and Services In-house Referral: Clinical Social Work Discharge Planning Services: CM Consult Post Acute Care Choice: Home Health                               Social Determinants of Health (SDOH) Interventions     Readmission Risk Interventions Readmission Risk Prevention Plan 06/28/2021  Transportation Screening Complete  HRI or Home Care Consult Complete  Social Work Consult for [place] Planning/Counseling Complete  Palliative Care Screening Not Applicable  Medication Review Press photographer) Complete  Some recent data might be hidden

## 2021-07-01 NOTE — Progress Notes (Signed)
SATURATION QUALIFICATIONS: (This note is used to comply with regulatory documentation for home oxygen)  Patient Saturations on Room Air at Rest = 95%  Patient Saturations on Room Air while Ambulating = 90%   

## 2021-07-01 NOTE — Procedures (Signed)
° °  HEMODIALYSIS TREATMENT NOTE:   Pt reports hypotension during ambulation prior to HD.  Soft pressures, UF goal was adjusted twice for SBP 90s (asymptomatic) but able to tolerate removal of 1.5 liters.  All blood was returned and hemostasis was achieved in 15 minutes.  No changes from pre-HD assessment.   Rockwell Alexandria, RN

## 2021-07-01 NOTE — Progress Notes (Addendum)
Discharge instructions/summary went over with patient by L. Hylton RN. All questions answered and patient expressed full understanding of instructions and medication education. Patient had no complaints of pain, shortness of breath, chest pain, nausea or vomiting. Patient discharged with all belongings for home via car.

## 2021-07-01 NOTE — Evaluation (Addendum)
Physical Therapy Evaluation Patient Details Name: [name] MRN: [id number] DOB: 1951/05/03 Today's Date: 07/01/2021  History of Present Illness  [name] is a 71 y.o. male with medical history significant for ESRD on HD (TThS), hypotension (especially after dialysis), T2DM,  combined chronic HFrEF and HFpEF, A. fib/flutter on warfarin who presents to the emergency department due to 2-day onset of progressively worsening shortness of breath and abdominal pain.  He has had several episodes of watery diarrhea and he endorsed worsening shortness of breath on bowel movement patient is also complained of mild generalized abdominal pain associated with nausea without vomiting.  Last bowel movement was few hours prior to arrival to the ED.  He denies chest pain, fever, chills, sick contacts, headache or blurry vision   Clinical Impression  Patient functioning at baseline for functional mobility and gait demonstrating good return for bed mobility , transfers and ambulation in room and hallways with 1 near loss of balance, but able to self correct with Min guard assist, on room air with SpO2 at 94% during ambulation and tolerated sitting up in chair after therapy.  Patient states he ambulates with home aides assisting during the week and presently at baseline.  Plan:  Patient discharged from physical therapy to care of nursing for ambulation daily as tolerated for length of stay.         Recommendations for follow up therapy are one component of a multi-disciplinary discharge planning process, led by the attending physician.  Recommendations may be updated based on patient status, additional functional criteria and insurance authorization.  Follow Up Recommendations No PT follow up    Assistance Recommended at Discharge Set up Supervision/Assistance  Patient can return home with the following  A little help with walking and/or transfers;A little help with bathing/dressing/bathroom;Help with  stairs or ramp for entrance;Assistance with cooking/housework    Equipment Recommendations None recommended by PT  Recommendations for Other Services       Functional Status Assessment Patient has had a recent decline in their functional status and/or demonstrates limited ability to make significant improvements in function in a reasonable and predictable amount of time     Precautions / Restrictions Precautions Precautions: Fall Restrictions Weight Bearing Restrictions: No      Mobility  Bed Mobility Overal bed mobility: Modified Independent                  Transfers Overall transfer level: Modified independent                 General transfer comment: slightly labored movement    Ambulation/Gait Ambulation/Gait assistance: Supervision, Min guard Gait Distance (Feet): 100 Feet Assistive device: Rolling walker (2 wheels) Gait Pattern/deviations: Decreased step length - right, Decreased step length - left, Decreased stride length Gait velocity: decreased     General Gait Details: slightly labored cadence with 1 near loss of balance able to self correct with Min guard assist, limited mostly due to fatigue  Stairs            Wheelchair Mobility    Modified Rankin (Stroke Patients Only)       Balance Overall balance assessment: Needs assistance Sitting-balance support: Feet supported, No upper extremity supported Sitting balance-Leahy Scale: Good Sitting balance - Comments: seated at EOB   Standing balance support: During functional activity, Bilateral upper extremity supported Standing balance-Leahy Scale: Fair Standing balance comment: fair/good using RW  Pertinent Vitals/Pain Pain Assessment Pain Assessment: No/denies pain    Home Living Family/patient expects to be discharged to:: Private residence Living Arrangements: Alone Available Help at Discharge: Personal care attendant;Available  PRN/intermittently Type of Home: Apartment Home Access: Ramped entrance       Home Layout: One level Home Equipment: Conservation officer, nature (2 wheels);Shower seat;Cane - single point      Prior Function Prior Level of Function : Needs assist       Physical Assist : Mobility (physical);ADLs (physical) Mobility (physical): Bed mobility;Transfers;Gait;Stairs   Mobility Comments: Household ambulator using RW most of time, sometimes SPC ADLs Comments: home aides 5 days/week x 4 hours/day     Hand Dominance   Dominant Hand: Right    Extremity/Trunk Assessment   Upper Extremity Assessment Upper Extremity Assessment: Overall WFL for tasks assessed    Lower Extremity Assessment Lower Extremity Assessment: Generalized weakness    Cervical / Trunk Assessment Cervical / Trunk Assessment: Normal  Communication   Communication: No difficulties  Cognition Arousal/Alertness: Awake/alert Behavior During Therapy: WFL for tasks assessed/performed Overall Cognitive Status: Within Functional Limits for tasks assessed                                          General Comments      Exercises     Assessment/Plan    PT Assessment Patient needs continued PT services  PT Problem List Decreased strength;Decreased activity tolerance;Decreased balance;Decreased mobility       PT Treatment Interventions DME instruction;Gait training;Stair training;Functional mobility training;Therapeutic activities;Therapeutic exercise;Balance training    PT Goals (Current goals can be found in the Care Plan section)  Acute Rehab PT Goals Patient Stated Goal: return home with home aides to assist PT Goal Formulation: With patient Time For Goal Achievement: 07/04/21 Potential to Achieve Goals: Good    Frequency       Co-evaluation               AM-PAC PT "6 Clicks" Mobility  Outcome Measure Help needed turning from your back to your side while in a flat bed without using  bedrails?: None Help needed moving from lying on your back to sitting on the side of a flat bed without using bedrails?: None Help needed moving to and from a bed to a chair (including a wheelchair)?: A Little Help needed standing up from a chair using your arms (e.g., wheelchair or bedside chair)?: A Little Help needed to walk in hospital room?: A Little Help needed climbing 3-5 steps with a railing? : A Lot 6 Click Score: 19    End of Session   Activity Tolerance: Patient tolerated treatment well;Patient limited by fatigue Patient left: in chair;with call bell/phone within reach Nurse Communication: Mobility status PT Visit Diagnosis: Unsteadiness on feet (R26.81);Other abnormalities of gait and mobility (R26.89);Muscle weakness (generalized) (M62.81)    Time: 5885-0277 PT Time Calculation (min) (ACUTE ONLY): 30 min   Charges:   PT Evaluation $PT Eval Moderate Complexity: 1 Mod PT Treatments $Therapeutic Activity: 23-37 mins        2:34 PM, 07/01/21 Lonell Grandchild, MPT Physical Therapist with [facility] [phone] office [phone] mobile phone

## 2021-07-02 DIAGNOSIS — E1129 Type 2 diabetes mellitus with other diabetic kidney complication: Secondary | ICD-10-CM | POA: Diagnosis not present

## 2021-07-02 DIAGNOSIS — N186 End stage renal disease: Secondary | ICD-10-CM | POA: Diagnosis not present

## 2021-07-02 DIAGNOSIS — Z992 Dependence on renal dialysis: Secondary | ICD-10-CM | POA: Diagnosis not present

## 2021-07-03 DIAGNOSIS — Z992 Dependence on renal dialysis: Secondary | ICD-10-CM | POA: Diagnosis not present

## 2021-07-03 DIAGNOSIS — A419 Sepsis, unspecified organism: Secondary | ICD-10-CM | POA: Diagnosis not present

## 2021-07-03 DIAGNOSIS — N2581 Secondary hyperparathyroidism of renal origin: Secondary | ICD-10-CM | POA: Diagnosis not present

## 2021-07-03 DIAGNOSIS — E1129 Type 2 diabetes mellitus with other diabetic kidney complication: Secondary | ICD-10-CM | POA: Diagnosis not present

## 2021-07-03 DIAGNOSIS — N186 End stage renal disease: Secondary | ICD-10-CM | POA: Diagnosis not present

## 2021-07-03 LAB — CULTURE, BLOOD (ROUTINE X 2)
Culture: NO GROWTH
Culture: NO GROWTH
Special Requests: ADEQUATE
Special Requests: ADEQUATE

## 2021-07-05 DIAGNOSIS — Z992 Dependence on renal dialysis: Secondary | ICD-10-CM | POA: Diagnosis not present

## 2021-07-05 DIAGNOSIS — E1129 Type 2 diabetes mellitus with other diabetic kidney complication: Secondary | ICD-10-CM | POA: Diagnosis not present

## 2021-07-05 DIAGNOSIS — N186 End stage renal disease: Secondary | ICD-10-CM | POA: Diagnosis not present

## 2021-07-05 DIAGNOSIS — N2581 Secondary hyperparathyroidism of renal origin: Secondary | ICD-10-CM | POA: Diagnosis not present

## 2021-07-07 DIAGNOSIS — Z20822 Contact with and (suspected) exposure to covid-19: Secondary | ICD-10-CM | POA: Diagnosis not present

## 2021-07-08 DIAGNOSIS — N2581 Secondary hyperparathyroidism of renal origin: Secondary | ICD-10-CM | POA: Diagnosis not present

## 2021-07-08 DIAGNOSIS — N186 End stage renal disease: Secondary | ICD-10-CM | POA: Diagnosis not present

## 2021-07-08 DIAGNOSIS — E1129 Type 2 diabetes mellitus with other diabetic kidney complication: Secondary | ICD-10-CM | POA: Diagnosis not present

## 2021-07-08 DIAGNOSIS — Z992 Dependence on renal dialysis: Secondary | ICD-10-CM | POA: Diagnosis not present

## 2021-07-10 DIAGNOSIS — N186 End stage renal disease: Secondary | ICD-10-CM | POA: Diagnosis not present

## 2021-07-10 DIAGNOSIS — Z992 Dependence on renal dialysis: Secondary | ICD-10-CM | POA: Diagnosis not present

## 2021-07-10 DIAGNOSIS — E1129 Type 2 diabetes mellitus with other diabetic kidney complication: Secondary | ICD-10-CM | POA: Diagnosis not present

## 2021-07-10 DIAGNOSIS — N2581 Secondary hyperparathyroidism of renal origin: Secondary | ICD-10-CM | POA: Diagnosis not present

## 2021-07-12 DIAGNOSIS — N186 End stage renal disease: Secondary | ICD-10-CM | POA: Diagnosis not present

## 2021-07-12 DIAGNOSIS — Z992 Dependence on renal dialysis: Secondary | ICD-10-CM | POA: Diagnosis not present

## 2021-07-12 DIAGNOSIS — N2581 Secondary hyperparathyroidism of renal origin: Secondary | ICD-10-CM | POA: Diagnosis not present

## 2021-07-12 DIAGNOSIS — E1129 Type 2 diabetes mellitus with other diabetic kidney complication: Secondary | ICD-10-CM | POA: Diagnosis not present

## 2021-07-15 DIAGNOSIS — Z992 Dependence on renal dialysis: Secondary | ICD-10-CM | POA: Diagnosis not present

## 2021-07-15 DIAGNOSIS — N186 End stage renal disease: Secondary | ICD-10-CM | POA: Diagnosis not present

## 2021-07-15 DIAGNOSIS — E1129 Type 2 diabetes mellitus with other diabetic kidney complication: Secondary | ICD-10-CM | POA: Diagnosis not present

## 2021-07-15 DIAGNOSIS — N2581 Secondary hyperparathyroidism of renal origin: Secondary | ICD-10-CM | POA: Diagnosis not present

## 2021-07-16 DIAGNOSIS — I4891 Unspecified atrial fibrillation: Secondary | ICD-10-CM | POA: Diagnosis not present

## 2021-07-16 DIAGNOSIS — Z7901 Long term (current) use of anticoagulants: Secondary | ICD-10-CM | POA: Diagnosis not present

## 2021-07-16 DIAGNOSIS — D6869 Other thrombophilia: Secondary | ICD-10-CM | POA: Diagnosis not present

## 2021-07-17 DIAGNOSIS — N186 End stage renal disease: Secondary | ICD-10-CM | POA: Diagnosis not present

## 2021-07-17 DIAGNOSIS — E1129 Type 2 diabetes mellitus with other diabetic kidney complication: Secondary | ICD-10-CM | POA: Diagnosis not present

## 2021-07-17 DIAGNOSIS — Z992 Dependence on renal dialysis: Secondary | ICD-10-CM | POA: Diagnosis not present

## 2021-07-17 DIAGNOSIS — N2581 Secondary hyperparathyroidism of renal origin: Secondary | ICD-10-CM | POA: Diagnosis not present

## 2021-07-19 DIAGNOSIS — N2581 Secondary hyperparathyroidism of renal origin: Secondary | ICD-10-CM | POA: Diagnosis not present

## 2021-07-19 DIAGNOSIS — E1129 Type 2 diabetes mellitus with other diabetic kidney complication: Secondary | ICD-10-CM | POA: Diagnosis not present

## 2021-07-19 DIAGNOSIS — Z992 Dependence on renal dialysis: Secondary | ICD-10-CM | POA: Diagnosis not present

## 2021-07-19 DIAGNOSIS — N186 End stage renal disease: Secondary | ICD-10-CM | POA: Diagnosis not present

## 2021-07-22 DIAGNOSIS — E1129 Type 2 diabetes mellitus with other diabetic kidney complication: Secondary | ICD-10-CM | POA: Diagnosis not present

## 2021-07-22 DIAGNOSIS — N2581 Secondary hyperparathyroidism of renal origin: Secondary | ICD-10-CM | POA: Diagnosis not present

## 2021-07-22 DIAGNOSIS — Z992 Dependence on renal dialysis: Secondary | ICD-10-CM | POA: Diagnosis not present

## 2021-07-22 DIAGNOSIS — N186 End stage renal disease: Secondary | ICD-10-CM | POA: Diagnosis not present

## 2021-07-24 DIAGNOSIS — N186 End stage renal disease: Secondary | ICD-10-CM | POA: Diagnosis not present

## 2021-07-24 DIAGNOSIS — E1129 Type 2 diabetes mellitus with other diabetic kidney complication: Secondary | ICD-10-CM | POA: Diagnosis not present

## 2021-07-24 DIAGNOSIS — N2581 Secondary hyperparathyroidism of renal origin: Secondary | ICD-10-CM | POA: Diagnosis not present

## 2021-07-24 DIAGNOSIS — Z992 Dependence on renal dialysis: Secondary | ICD-10-CM | POA: Diagnosis not present

## 2021-07-25 ENCOUNTER — Encounter (INDEPENDENT_AMBULATORY_CARE_PROVIDER_SITE_OTHER): Payer: Medicare Other | Admitting: Ophthalmology

## 2021-07-25 ENCOUNTER — Other Ambulatory Visit: Payer: Self-pay

## 2021-07-25 DIAGNOSIS — E113593 Type 2 diabetes mellitus with proliferative diabetic retinopathy without macular edema, bilateral: Secondary | ICD-10-CM

## 2021-07-25 DIAGNOSIS — H43813 Vitreous degeneration, bilateral: Secondary | ICD-10-CM | POA: Diagnosis not present

## 2021-07-25 DIAGNOSIS — I10 Essential (primary) hypertension: Secondary | ICD-10-CM

## 2021-07-25 DIAGNOSIS — H35033 Hypertensive retinopathy, bilateral: Secondary | ICD-10-CM | POA: Diagnosis not present

## 2021-07-26 DIAGNOSIS — E1129 Type 2 diabetes mellitus with other diabetic kidney complication: Secondary | ICD-10-CM | POA: Diagnosis not present

## 2021-07-26 DIAGNOSIS — N186 End stage renal disease: Secondary | ICD-10-CM | POA: Diagnosis not present

## 2021-07-26 DIAGNOSIS — Z992 Dependence on renal dialysis: Secondary | ICD-10-CM | POA: Diagnosis not present

## 2021-07-26 DIAGNOSIS — N2581 Secondary hyperparathyroidism of renal origin: Secondary | ICD-10-CM | POA: Diagnosis not present

## 2021-07-28 DIAGNOSIS — Z20822 Contact with and (suspected) exposure to covid-19: Secondary | ICD-10-CM | POA: Diagnosis not present

## 2021-07-29 DIAGNOSIS — N186 End stage renal disease: Secondary | ICD-10-CM | POA: Diagnosis not present

## 2021-07-29 DIAGNOSIS — Z992 Dependence on renal dialysis: Secondary | ICD-10-CM | POA: Diagnosis not present

## 2021-07-29 DIAGNOSIS — N2581 Secondary hyperparathyroidism of renal origin: Secondary | ICD-10-CM | POA: Diagnosis not present

## 2021-07-29 DIAGNOSIS — E1129 Type 2 diabetes mellitus with other diabetic kidney complication: Secondary | ICD-10-CM | POA: Diagnosis not present

## 2021-07-30 DIAGNOSIS — Z992 Dependence on renal dialysis: Secondary | ICD-10-CM | POA: Diagnosis not present

## 2021-07-30 DIAGNOSIS — N186 End stage renal disease: Secondary | ICD-10-CM | POA: Diagnosis not present

## 2021-07-30 DIAGNOSIS — E1129 Type 2 diabetes mellitus with other diabetic kidney complication: Secondary | ICD-10-CM | POA: Diagnosis not present

## 2021-07-31 DIAGNOSIS — D631 Anemia in chronic kidney disease: Secondary | ICD-10-CM | POA: Diagnosis not present

## 2021-07-31 DIAGNOSIS — Z992 Dependence on renal dialysis: Secondary | ICD-10-CM | POA: Diagnosis not present

## 2021-07-31 DIAGNOSIS — N2581 Secondary hyperparathyroidism of renal origin: Secondary | ICD-10-CM | POA: Diagnosis not present

## 2021-07-31 DIAGNOSIS — D509 Iron deficiency anemia, unspecified: Secondary | ICD-10-CM | POA: Diagnosis not present

## 2021-07-31 DIAGNOSIS — E1129 Type 2 diabetes mellitus with other diabetic kidney complication: Secondary | ICD-10-CM | POA: Diagnosis not present

## 2021-07-31 DIAGNOSIS — N186 End stage renal disease: Secondary | ICD-10-CM | POA: Diagnosis not present

## 2021-08-02 DIAGNOSIS — E1129 Type 2 diabetes mellitus with other diabetic kidney complication: Secondary | ICD-10-CM | POA: Diagnosis not present

## 2021-08-02 DIAGNOSIS — D509 Iron deficiency anemia, unspecified: Secondary | ICD-10-CM | POA: Diagnosis not present

## 2021-08-02 DIAGNOSIS — D631 Anemia in chronic kidney disease: Secondary | ICD-10-CM | POA: Diagnosis not present

## 2021-08-02 DIAGNOSIS — Z992 Dependence on renal dialysis: Secondary | ICD-10-CM | POA: Diagnosis not present

## 2021-08-02 DIAGNOSIS — N186 End stage renal disease: Secondary | ICD-10-CM | POA: Diagnosis not present

## 2021-08-02 DIAGNOSIS — N2581 Secondary hyperparathyroidism of renal origin: Secondary | ICD-10-CM | POA: Diagnosis not present

## 2021-08-05 DIAGNOSIS — N186 End stage renal disease: Secondary | ICD-10-CM | POA: Diagnosis not present

## 2021-08-05 DIAGNOSIS — E1129 Type 2 diabetes mellitus with other diabetic kidney complication: Secondary | ICD-10-CM | POA: Diagnosis not present

## 2021-08-05 DIAGNOSIS — N2581 Secondary hyperparathyroidism of renal origin: Secondary | ICD-10-CM | POA: Diagnosis not present

## 2021-08-05 DIAGNOSIS — Z992 Dependence on renal dialysis: Secondary | ICD-10-CM | POA: Diagnosis not present

## 2021-08-05 DIAGNOSIS — D509 Iron deficiency anemia, unspecified: Secondary | ICD-10-CM | POA: Diagnosis not present

## 2021-08-05 DIAGNOSIS — D631 Anemia in chronic kidney disease: Secondary | ICD-10-CM | POA: Diagnosis not present

## 2021-08-07 DIAGNOSIS — D509 Iron deficiency anemia, unspecified: Secondary | ICD-10-CM | POA: Diagnosis not present

## 2021-08-07 DIAGNOSIS — Z992 Dependence on renal dialysis: Secondary | ICD-10-CM | POA: Diagnosis not present

## 2021-08-07 DIAGNOSIS — N2581 Secondary hyperparathyroidism of renal origin: Secondary | ICD-10-CM | POA: Diagnosis not present

## 2021-08-07 DIAGNOSIS — E1129 Type 2 diabetes mellitus with other diabetic kidney complication: Secondary | ICD-10-CM | POA: Diagnosis not present

## 2021-08-07 DIAGNOSIS — N186 End stage renal disease: Secondary | ICD-10-CM | POA: Diagnosis not present

## 2021-08-07 DIAGNOSIS — D631 Anemia in chronic kidney disease: Secondary | ICD-10-CM | POA: Diagnosis not present

## 2021-08-08 ENCOUNTER — Other Ambulatory Visit: Payer: Self-pay

## 2021-08-08 ENCOUNTER — Ambulatory Visit (INDEPENDENT_AMBULATORY_CARE_PROVIDER_SITE_OTHER): Payer: Medicare Other | Admitting: Podiatry

## 2021-08-08 ENCOUNTER — Encounter: Payer: Self-pay | Admitting: Podiatry

## 2021-08-08 DIAGNOSIS — M79676 Pain in unspecified toe(s): Secondary | ICD-10-CM | POA: Diagnosis not present

## 2021-08-08 DIAGNOSIS — E0842 Diabetes mellitus due to underlying condition with diabetic polyneuropathy: Secondary | ICD-10-CM | POA: Diagnosis not present

## 2021-08-08 DIAGNOSIS — Z89431 Acquired absence of right foot: Secondary | ICD-10-CM

## 2021-08-08 DIAGNOSIS — B351 Tinea unguium: Secondary | ICD-10-CM | POA: Diagnosis not present

## 2021-08-08 DIAGNOSIS — I739 Peripheral vascular disease, unspecified: Secondary | ICD-10-CM | POA: Diagnosis not present

## 2021-08-08 NOTE — Progress Notes (Signed)
Complaint:  ?Visit Type: Patient returns to my office for continued preventative foot care services. Complaint: Patient states" my nails have grown long and thick and become painful to walk and wear shoes on his left foot.  Patient has been diagnosed with DM with angiopathy.  Patient has had transmetatarsal amputation performed toes right foot.. The patient presents for preventative foot care services. No changes to ROS ? ?Podiatric Exam: ?Vascular: dorsalis pedis and posterior tibial pulses are not  palpable left.  ... Capillary return is immediate. Temperature gradient is WNL. Skin turgor WNL  Significant swelling feet  Left  ?Sensorium: Normal Semmes Weinstein monofilament test. Normal tactile sensation left foot. ?Nail Exam: Pt has thick disfigured discolored nails with subungual debris noted  entire nail hallux through fifth toenails left foot.  Scant drainage subungually left hallux toenail. ?Ulcer Exam: There is no evidence of ulcer or pre-ulcerative changes or infection. ?Orthopedic Exam: Muscle tone and strength are WNL. No limitations in general ROM. No crepitus or effusions noted. Foot type and digits show no abnormalities. Bony prominences are unremarkable. TMA right foot. ?Skin: No Porokeratosis. No infection or ulcers.   ? ?Diagnosis:  ?Onychomycosis, ,, pain in left toes,  TMA right foot. ? ?Treatment & Plan ?Procedures and Treatment: Consent by patient was obtained for treatment procedures.   Debridement of mycotic and hypertrophic toenails, 1 through 5 left foot  and clearing of subungual debris. No ulceration, no infection noted.  ?Return Visit-Office Procedure: Patient instructed to return to the office for a follow up visit 3 months for continued evaluation and treatment. ? ? ? ?Gardiner Barefoot DPM ?

## 2021-08-09 DIAGNOSIS — E1129 Type 2 diabetes mellitus with other diabetic kidney complication: Secondary | ICD-10-CM | POA: Diagnosis not present

## 2021-08-09 DIAGNOSIS — D509 Iron deficiency anemia, unspecified: Secondary | ICD-10-CM | POA: Diagnosis not present

## 2021-08-09 DIAGNOSIS — N186 End stage renal disease: Secondary | ICD-10-CM | POA: Diagnosis not present

## 2021-08-09 DIAGNOSIS — Z992 Dependence on renal dialysis: Secondary | ICD-10-CM | POA: Diagnosis not present

## 2021-08-09 DIAGNOSIS — N2581 Secondary hyperparathyroidism of renal origin: Secondary | ICD-10-CM | POA: Diagnosis not present

## 2021-08-09 DIAGNOSIS — D631 Anemia in chronic kidney disease: Secondary | ICD-10-CM | POA: Diagnosis not present

## 2021-08-12 DIAGNOSIS — E1129 Type 2 diabetes mellitus with other diabetic kidney complication: Secondary | ICD-10-CM | POA: Diagnosis not present

## 2021-08-12 DIAGNOSIS — N2581 Secondary hyperparathyroidism of renal origin: Secondary | ICD-10-CM | POA: Diagnosis not present

## 2021-08-12 DIAGNOSIS — D631 Anemia in chronic kidney disease: Secondary | ICD-10-CM | POA: Diagnosis not present

## 2021-08-12 DIAGNOSIS — N186 End stage renal disease: Secondary | ICD-10-CM | POA: Diagnosis not present

## 2021-08-12 DIAGNOSIS — Z992 Dependence on renal dialysis: Secondary | ICD-10-CM | POA: Diagnosis not present

## 2021-08-12 DIAGNOSIS — D509 Iron deficiency anemia, unspecified: Secondary | ICD-10-CM | POA: Diagnosis not present

## 2021-08-13 DIAGNOSIS — D6869 Other thrombophilia: Secondary | ICD-10-CM | POA: Diagnosis not present

## 2021-08-13 DIAGNOSIS — Z7901 Long term (current) use of anticoagulants: Secondary | ICD-10-CM | POA: Diagnosis not present

## 2021-08-13 DIAGNOSIS — I4891 Unspecified atrial fibrillation: Secondary | ICD-10-CM | POA: Diagnosis not present

## 2021-08-14 DIAGNOSIS — N2581 Secondary hyperparathyroidism of renal origin: Secondary | ICD-10-CM | POA: Diagnosis not present

## 2021-08-14 DIAGNOSIS — D509 Iron deficiency anemia, unspecified: Secondary | ICD-10-CM | POA: Diagnosis not present

## 2021-08-14 DIAGNOSIS — D631 Anemia in chronic kidney disease: Secondary | ICD-10-CM | POA: Diagnosis not present

## 2021-08-14 DIAGNOSIS — N186 End stage renal disease: Secondary | ICD-10-CM | POA: Diagnosis not present

## 2021-08-14 DIAGNOSIS — E1129 Type 2 diabetes mellitus with other diabetic kidney complication: Secondary | ICD-10-CM | POA: Diagnosis not present

## 2021-08-14 DIAGNOSIS — Z992 Dependence on renal dialysis: Secondary | ICD-10-CM | POA: Diagnosis not present

## 2021-08-16 DIAGNOSIS — D631 Anemia in chronic kidney disease: Secondary | ICD-10-CM | POA: Diagnosis not present

## 2021-08-16 DIAGNOSIS — D509 Iron deficiency anemia, unspecified: Secondary | ICD-10-CM | POA: Diagnosis not present

## 2021-08-16 DIAGNOSIS — Z992 Dependence on renal dialysis: Secondary | ICD-10-CM | POA: Diagnosis not present

## 2021-08-16 DIAGNOSIS — E1129 Type 2 diabetes mellitus with other diabetic kidney complication: Secondary | ICD-10-CM | POA: Diagnosis not present

## 2021-08-16 DIAGNOSIS — N2581 Secondary hyperparathyroidism of renal origin: Secondary | ICD-10-CM | POA: Diagnosis not present

## 2021-08-16 DIAGNOSIS — N186 End stage renal disease: Secondary | ICD-10-CM | POA: Diagnosis not present

## 2021-08-19 DIAGNOSIS — N186 End stage renal disease: Secondary | ICD-10-CM | POA: Diagnosis not present

## 2021-08-19 DIAGNOSIS — E1129 Type 2 diabetes mellitus with other diabetic kidney complication: Secondary | ICD-10-CM | POA: Diagnosis not present

## 2021-08-19 DIAGNOSIS — N2581 Secondary hyperparathyroidism of renal origin: Secondary | ICD-10-CM | POA: Diagnosis not present

## 2021-08-19 DIAGNOSIS — D631 Anemia in chronic kidney disease: Secondary | ICD-10-CM | POA: Diagnosis not present

## 2021-08-19 DIAGNOSIS — Z992 Dependence on renal dialysis: Secondary | ICD-10-CM | POA: Diagnosis not present

## 2021-08-19 DIAGNOSIS — D509 Iron deficiency anemia, unspecified: Secondary | ICD-10-CM | POA: Diagnosis not present

## 2021-08-20 DIAGNOSIS — D6869 Other thrombophilia: Secondary | ICD-10-CM | POA: Diagnosis not present

## 2021-08-20 DIAGNOSIS — Z7901 Long term (current) use of anticoagulants: Secondary | ICD-10-CM | POA: Diagnosis not present

## 2021-08-20 DIAGNOSIS — I4891 Unspecified atrial fibrillation: Secondary | ICD-10-CM | POA: Diagnosis not present

## 2021-08-21 DIAGNOSIS — E1129 Type 2 diabetes mellitus with other diabetic kidney complication: Secondary | ICD-10-CM | POA: Diagnosis not present

## 2021-08-21 DIAGNOSIS — Z992 Dependence on renal dialysis: Secondary | ICD-10-CM | POA: Diagnosis not present

## 2021-08-21 DIAGNOSIS — D631 Anemia in chronic kidney disease: Secondary | ICD-10-CM | POA: Diagnosis not present

## 2021-08-21 DIAGNOSIS — D509 Iron deficiency anemia, unspecified: Secondary | ICD-10-CM | POA: Diagnosis not present

## 2021-08-21 DIAGNOSIS — N186 End stage renal disease: Secondary | ICD-10-CM | POA: Diagnosis not present

## 2021-08-21 DIAGNOSIS — N2581 Secondary hyperparathyroidism of renal origin: Secondary | ICD-10-CM | POA: Diagnosis not present

## 2021-08-23 DIAGNOSIS — E1129 Type 2 diabetes mellitus with other diabetic kidney complication: Secondary | ICD-10-CM | POA: Diagnosis not present

## 2021-08-23 DIAGNOSIS — N186 End stage renal disease: Secondary | ICD-10-CM | POA: Diagnosis not present

## 2021-08-23 DIAGNOSIS — N2581 Secondary hyperparathyroidism of renal origin: Secondary | ICD-10-CM | POA: Diagnosis not present

## 2021-08-23 DIAGNOSIS — Z992 Dependence on renal dialysis: Secondary | ICD-10-CM | POA: Diagnosis not present

## 2021-08-23 DIAGNOSIS — D509 Iron deficiency anemia, unspecified: Secondary | ICD-10-CM | POA: Diagnosis not present

## 2021-08-23 DIAGNOSIS — D631 Anemia in chronic kidney disease: Secondary | ICD-10-CM | POA: Diagnosis not present

## 2021-08-26 DIAGNOSIS — N186 End stage renal disease: Secondary | ICD-10-CM | POA: Diagnosis not present

## 2021-08-26 DIAGNOSIS — D509 Iron deficiency anemia, unspecified: Secondary | ICD-10-CM | POA: Diagnosis not present

## 2021-08-26 DIAGNOSIS — D631 Anemia in chronic kidney disease: Secondary | ICD-10-CM | POA: Diagnosis not present

## 2021-08-26 DIAGNOSIS — Z992 Dependence on renal dialysis: Secondary | ICD-10-CM | POA: Diagnosis not present

## 2021-08-26 DIAGNOSIS — N2581 Secondary hyperparathyroidism of renal origin: Secondary | ICD-10-CM | POA: Diagnosis not present

## 2021-08-26 DIAGNOSIS — E1129 Type 2 diabetes mellitus with other diabetic kidney complication: Secondary | ICD-10-CM | POA: Diagnosis not present

## 2021-08-28 DIAGNOSIS — D509 Iron deficiency anemia, unspecified: Secondary | ICD-10-CM | POA: Diagnosis not present

## 2021-08-28 DIAGNOSIS — E1129 Type 2 diabetes mellitus with other diabetic kidney complication: Secondary | ICD-10-CM | POA: Diagnosis not present

## 2021-08-28 DIAGNOSIS — N2581 Secondary hyperparathyroidism of renal origin: Secondary | ICD-10-CM | POA: Diagnosis not present

## 2021-08-28 DIAGNOSIS — Z992 Dependence on renal dialysis: Secondary | ICD-10-CM | POA: Diagnosis not present

## 2021-08-28 DIAGNOSIS — N186 End stage renal disease: Secondary | ICD-10-CM | POA: Diagnosis not present

## 2021-08-28 DIAGNOSIS — D631 Anemia in chronic kidney disease: Secondary | ICD-10-CM | POA: Diagnosis not present

## 2021-08-30 DIAGNOSIS — D509 Iron deficiency anemia, unspecified: Secondary | ICD-10-CM | POA: Diagnosis not present

## 2021-08-30 DIAGNOSIS — Z992 Dependence on renal dialysis: Secondary | ICD-10-CM | POA: Diagnosis not present

## 2021-08-30 DIAGNOSIS — D631 Anemia in chronic kidney disease: Secondary | ICD-10-CM | POA: Diagnosis not present

## 2021-08-30 DIAGNOSIS — N2581 Secondary hyperparathyroidism of renal origin: Secondary | ICD-10-CM | POA: Diagnosis not present

## 2021-08-30 DIAGNOSIS — N186 End stage renal disease: Secondary | ICD-10-CM | POA: Diagnosis not present

## 2021-08-30 DIAGNOSIS — E1129 Type 2 diabetes mellitus with other diabetic kidney complication: Secondary | ICD-10-CM | POA: Diagnosis not present

## 2021-09-02 DIAGNOSIS — N2581 Secondary hyperparathyroidism of renal origin: Secondary | ICD-10-CM | POA: Diagnosis not present

## 2021-09-02 DIAGNOSIS — D631 Anemia in chronic kidney disease: Secondary | ICD-10-CM | POA: Diagnosis not present

## 2021-09-02 DIAGNOSIS — D509 Iron deficiency anemia, unspecified: Secondary | ICD-10-CM | POA: Diagnosis not present

## 2021-09-02 DIAGNOSIS — Z992 Dependence on renal dialysis: Secondary | ICD-10-CM | POA: Diagnosis not present

## 2021-09-02 DIAGNOSIS — N186 End stage renal disease: Secondary | ICD-10-CM | POA: Diagnosis not present

## 2021-09-04 DIAGNOSIS — D631 Anemia in chronic kidney disease: Secondary | ICD-10-CM | POA: Diagnosis not present

## 2021-09-04 DIAGNOSIS — Z992 Dependence on renal dialysis: Secondary | ICD-10-CM | POA: Diagnosis not present

## 2021-09-04 DIAGNOSIS — D509 Iron deficiency anemia, unspecified: Secondary | ICD-10-CM | POA: Diagnosis not present

## 2021-09-04 DIAGNOSIS — N2581 Secondary hyperparathyroidism of renal origin: Secondary | ICD-10-CM | POA: Diagnosis not present

## 2021-09-04 DIAGNOSIS — N186 End stage renal disease: Secondary | ICD-10-CM | POA: Diagnosis not present

## 2021-09-06 DIAGNOSIS — N186 End stage renal disease: Secondary | ICD-10-CM | POA: Diagnosis not present

## 2021-09-06 DIAGNOSIS — D631 Anemia in chronic kidney disease: Secondary | ICD-10-CM | POA: Diagnosis not present

## 2021-09-06 DIAGNOSIS — D509 Iron deficiency anemia, unspecified: Secondary | ICD-10-CM | POA: Diagnosis not present

## 2021-09-06 DIAGNOSIS — N2581 Secondary hyperparathyroidism of renal origin: Secondary | ICD-10-CM | POA: Diagnosis not present

## 2021-09-06 DIAGNOSIS — Z992 Dependence on renal dialysis: Secondary | ICD-10-CM | POA: Diagnosis not present

## 2021-09-09 DIAGNOSIS — Z992 Dependence on renal dialysis: Secondary | ICD-10-CM | POA: Diagnosis not present

## 2021-09-09 DIAGNOSIS — N2581 Secondary hyperparathyroidism of renal origin: Secondary | ICD-10-CM | POA: Diagnosis not present

## 2021-09-09 DIAGNOSIS — N186 End stage renal disease: Secondary | ICD-10-CM | POA: Diagnosis not present

## 2021-09-09 DIAGNOSIS — D631 Anemia in chronic kidney disease: Secondary | ICD-10-CM | POA: Diagnosis not present

## 2021-09-09 DIAGNOSIS — D509 Iron deficiency anemia, unspecified: Secondary | ICD-10-CM | POA: Diagnosis not present

## 2021-09-10 ENCOUNTER — Other Ambulatory Visit: Payer: Self-pay | Admitting: Internal Medicine

## 2021-09-10 ENCOUNTER — Other Ambulatory Visit (HOSPITAL_COMMUNITY): Payer: Self-pay | Admitting: Internal Medicine

## 2021-09-10 DIAGNOSIS — D6869 Other thrombophilia: Secondary | ICD-10-CM | POA: Diagnosis not present

## 2021-09-10 DIAGNOSIS — I4891 Unspecified atrial fibrillation: Secondary | ICD-10-CM | POA: Diagnosis not present

## 2021-09-10 DIAGNOSIS — R109 Unspecified abdominal pain: Secondary | ICD-10-CM | POA: Diagnosis not present

## 2021-09-10 DIAGNOSIS — Z7901 Long term (current) use of anticoagulants: Secondary | ICD-10-CM | POA: Diagnosis not present

## 2021-09-10 DIAGNOSIS — R369 Urethral discharge, unspecified: Secondary | ICD-10-CM | POA: Diagnosis not present

## 2021-09-11 DIAGNOSIS — N186 End stage renal disease: Secondary | ICD-10-CM | POA: Diagnosis not present

## 2021-09-11 DIAGNOSIS — N2581 Secondary hyperparathyroidism of renal origin: Secondary | ICD-10-CM | POA: Diagnosis not present

## 2021-09-11 DIAGNOSIS — D631 Anemia in chronic kidney disease: Secondary | ICD-10-CM | POA: Diagnosis not present

## 2021-09-11 DIAGNOSIS — Z992 Dependence on renal dialysis: Secondary | ICD-10-CM | POA: Diagnosis not present

## 2021-09-11 DIAGNOSIS — D509 Iron deficiency anemia, unspecified: Secondary | ICD-10-CM | POA: Diagnosis not present

## 2021-09-12 ENCOUNTER — Ambulatory Visit (HOSPITAL_COMMUNITY)
Admission: RE | Admit: 2021-09-12 | Discharge: 2021-09-12 | Disposition: A | Discharge status: Home / Self Care | Payer: Medicare Other | Source: Ambulatory Visit | Attending: Internal Medicine | Admitting: Internal Medicine

## 2021-09-12 DIAGNOSIS — I700 Atherosclerosis of aorta: Secondary | ICD-10-CM | POA: Diagnosis not present

## 2021-09-12 DIAGNOSIS — R109 Unspecified abdominal pain: Secondary | ICD-10-CM | POA: Diagnosis not present

## 2021-09-12 MED ORDER — IOHEXOL 350 MG/ML SOLN
100.0000 mL | Freq: Once | INTRAVENOUS | Status: AC | PRN
  Administered 2021-09-12: 100 mL via INTRAVENOUS

## 2021-09-13 DIAGNOSIS — Z992 Dependence on renal dialysis: Secondary | ICD-10-CM | POA: Diagnosis not present

## 2021-09-13 DIAGNOSIS — N2581 Secondary hyperparathyroidism of renal origin: Secondary | ICD-10-CM | POA: Diagnosis not present

## 2021-09-13 DIAGNOSIS — D631 Anemia in chronic kidney disease: Secondary | ICD-10-CM | POA: Diagnosis not present

## 2021-09-13 DIAGNOSIS — N186 End stage renal disease: Secondary | ICD-10-CM | POA: Diagnosis not present

## 2021-09-13 DIAGNOSIS — D509 Iron deficiency anemia, unspecified: Secondary | ICD-10-CM | POA: Diagnosis not present

## 2021-09-15 DIAGNOSIS — Z20822 Contact with and (suspected) exposure to covid-19: Secondary | ICD-10-CM | POA: Diagnosis not present

## 2021-09-15 DIAGNOSIS — R404 Transient alteration of awareness: Secondary | ICD-10-CM | POA: Diagnosis not present

## 2021-09-15 DIAGNOSIS — I469 Cardiac arrest, cause unspecified: Secondary | ICD-10-CM | POA: Diagnosis not present

## 2021-09-29 DIAGNOSIS — 419620001 Death: Secondary | SNOMED CT | POA: Diagnosis not present

## 2021-09-29 DEATH — deceased

## 2021-11-04 NOTE — Patient Outreach (Signed)
[facility] [facility]) Care Management  11/04/2021  [name] Nov 11, 1950 [id number]   Received referral for Care Management from Insurance plan. Assigned patient to Joellyn Quails, RN Care Coordinator for follow up.   [place] Management Assistant [phone]

## 2021-11-07 ENCOUNTER — Encounter (HOSPITAL_COMMUNITY): Payer: Self-pay | Admitting: *Deleted

## 2021-11-14 ENCOUNTER — Ambulatory Visit: Payer: Medicare Other | Admitting: Podiatry

## 2021-11-26 ENCOUNTER — Other Ambulatory Visit: Payer: Self-pay | Admitting: *Deleted

## 2021-11-26 ENCOUNTER — Encounter (HOSPITAL_COMMUNITY): Payer: Self-pay | Admitting: *Deleted

## 2021-11-26 NOTE — Patient Outreach (Signed)
[facility]) Care Management  11/26/2021  [name] [birth year] [id number]   [name] Unsuccessful outreach #1 Outreach attempt to the listed at the preferred outreach number in EPIC [phone] This is listed as his mobile and home numbers in EPIC demographics and care everywhere No answer. Invalid number           Plan: [facility] RN CM scheduled this patient for another call attempt within 4-7 business days Unsuccessful outreach letter sent on 11/26/21 Unsuccessful outreach on 11/26/21   [name], RN, BSN, [place] Coordinator Office number [phone]

## 2021-11-27 ENCOUNTER — Other Ambulatory Visit: Payer: Self-pay | Admitting: *Deleted

## 2021-11-27 NOTE — Patient Outreach (Signed)
[facility] [facility]) Care Management  11/27/2021  [name] 1950/08/21 [id number]   [facility] Unsuccessful outreach #2 Outreach attempt to the listed at the preferred outreach number in EPIC [phone] This is listed as his mobile and home numbers in EPIC demographics and care everywhere No answer. Invalid number                    Plan: [name] RN CM scheduled this patient for another call attempt within 4-7 business days Unsuccessful outreach letter sent on 11/26/21 Unsuccessful outreach on 11/26/21, 11/27/21  [name], RN, BSN, [place] Coordinator Office number [phone] Main [facility] number [phone] Fax number [phone]

## 2021-11-28 ENCOUNTER — Other Ambulatory Visit: Payer: Self-pay | Admitting: *Deleted

## 2021-11-28 NOTE — Patient Outreach (Signed)
[facility] [place] [facility]) Care Management  11/28/2021  [name] 20-Aug-1950 [id number]   [facility] Unsuccessful outreach #3 Outreach attempt to the listed at the preferred outreach number in EPIC [phone] This is listed as his mobile and home numbers in EPIC demographics and care everywhere No answer. Invalid number                    Plan: [facility] RN CM scheduled this patient for  case closure  Unsuccessful outreach letter sent on 11/26/21 Unsuccessful outreach on 11/26/21, 11/27/21 11/28/21   Mariellen Blaney L. Lavina Hamman, RN, BSN, [place] Coordinator Office number [phone] Main [facility] number [phone] Fax number [phone]

## 2021-12-23 ENCOUNTER — Other Ambulatory Visit: Payer: Self-pay | Admitting: *Deleted

## 2021-12-23 NOTE — Patient Outreach (Signed)
[place] [facility]) Care Management  12/23/2021  [name] 1951/01/11 [id number]   [facility] Case closure for ACO referred patient  Call attempts made on 11/26/21, 11/27/21 11/28/21 Unsuccessful outreach letter sent on 11/26/21 without a response   Plan [facility] RN CM will close case after no response from patient within 10 business days. Unable to reach Case closure letters sent to patient and MD  [name], RN, BSN, [place] Coordinator Office number [phone] Main [facility] number [phone] Fax number [phone]

## 2022-01-23 ENCOUNTER — Encounter (INDEPENDENT_AMBULATORY_CARE_PROVIDER_SITE_OTHER): Payer: Medicare Other | Admitting: Ophthalmology
